# Patient Record
Sex: Male | Born: 1935
Health system: Southern US, Community
[De-identification: ages and names within clinical notes are randomized; demographics above are authoritative.]

## PROBLEM LIST (undated history)

## (undated) DIAGNOSIS — G4733 Obstructive sleep apnea (adult) (pediatric): Secondary | ICD-10-CM

## (undated) DIAGNOSIS — I639 Cerebral infarction, unspecified: Secondary | ICD-10-CM

## (undated) DIAGNOSIS — H9190 Unspecified hearing loss, unspecified ear: Secondary | ICD-10-CM

## (undated) DIAGNOSIS — I38 Endocarditis, valve unspecified: Secondary | ICD-10-CM

## (undated) DIAGNOSIS — E119 Type 2 diabetes mellitus without complications: Secondary | ICD-10-CM

## (undated) DIAGNOSIS — N200 Calculus of kidney: Secondary | ICD-10-CM

## (undated) DIAGNOSIS — I4891 Unspecified atrial fibrillation: Secondary | ICD-10-CM

## (undated) DIAGNOSIS — N189 Chronic kidney disease, unspecified: Secondary | ICD-10-CM

## (undated) DIAGNOSIS — I10 Essential (primary) hypertension: Secondary | ICD-10-CM

## (undated) DIAGNOSIS — I509 Heart failure, unspecified: Secondary | ICD-10-CM

## (undated) DIAGNOSIS — I82412 Acute embolism and thrombosis of left femoral vein: Secondary | ICD-10-CM

## (undated) DIAGNOSIS — E785 Hyperlipidemia, unspecified: Secondary | ICD-10-CM

## (undated) DIAGNOSIS — C61 Malignant neoplasm of prostate: Secondary | ICD-10-CM

## (undated) DIAGNOSIS — K7689 Other specified diseases of liver: Secondary | ICD-10-CM

## (undated) DIAGNOSIS — I495 Sick sinus syndrome: Secondary | ICD-10-CM

## (undated) DIAGNOSIS — Z87448 Personal history of other diseases of urinary system: Secondary | ICD-10-CM

## (undated) HISTORY — DX: Cerebral infarction, unspecified: I63.9

## (undated) HISTORY — DX: Unspecified hearing loss, unspecified ear: H91.90

## (undated) HISTORY — DX: Chronic kidney disease, unspecified: N18.9

## (undated) HISTORY — PX: PROSTATE SURGERY: SHX751

## (undated) HISTORY — DX: Personal history of other diseases of urinary system: Z87.448

## (undated) HISTORY — PX: OTHER SURGICAL HISTORY: SHX169

## (undated) HISTORY — DX: Endocarditis, valve unspecified: I38

## (undated) HISTORY — DX: Acute embolism and thrombosis of left femoral vein: I82.412

## (undated) HISTORY — DX: Obstructive sleep apnea (adult) (pediatric): G47.33

## (undated) HISTORY — PX: CATARACT EXTRACTION: SUR2

## (undated) HISTORY — DX: Hyperlipidemia, unspecified: E78.5

## (undated) HISTORY — DX: Essential (primary) hypertension: I10

## (undated) HISTORY — DX: Other specified diseases of liver: K76.89

## (undated) HISTORY — DX: Type 2 diabetes mellitus without complications: E11.9

---

## 2003-11-16 ENCOUNTER — Ambulatory Visit: Payer: Self-pay | Admitting: Gastroenterology

## 2004-12-03 ENCOUNTER — Emergency Department: Payer: Self-pay | Admitting: Emergency Medicine

## 2004-12-03 ENCOUNTER — Other Ambulatory Visit: Payer: Self-pay

## 2004-12-12 ENCOUNTER — Ambulatory Visit: Payer: Self-pay | Admitting: Family Medicine

## 2005-07-30 ENCOUNTER — Other Ambulatory Visit: Payer: Self-pay

## 2005-07-30 ENCOUNTER — Inpatient Hospital Stay: Payer: Self-pay | Admitting: Surgery

## 2005-09-09 ENCOUNTER — Ambulatory Visit: Payer: Self-pay | Admitting: Gastroenterology

## 2006-04-30 ENCOUNTER — Ambulatory Visit: Payer: Self-pay | Admitting: Specialist

## 2006-05-29 ENCOUNTER — Ambulatory Visit: Payer: Self-pay | Admitting: Family Medicine

## 2006-10-07 ENCOUNTER — Emergency Department: Payer: Self-pay | Admitting: Emergency Medicine

## 2006-12-09 ENCOUNTER — Ambulatory Visit: Payer: Self-pay | Admitting: Specialist

## 2007-04-17 ENCOUNTER — Ambulatory Visit: Payer: Self-pay | Admitting: Internal Medicine

## 2007-05-07 ENCOUNTER — Ambulatory Visit: Payer: Self-pay | Admitting: Internal Medicine

## 2008-10-07 ENCOUNTER — Emergency Department: Payer: Self-pay | Admitting: Emergency Medicine

## 2008-11-03 ENCOUNTER — Ambulatory Visit: Payer: Self-pay | Admitting: Gastroenterology

## 2009-04-25 ENCOUNTER — Emergency Department: Payer: Self-pay | Admitting: Emergency Medicine

## 2009-07-07 ENCOUNTER — Emergency Department: Payer: Self-pay | Admitting: Emergency Medicine

## 2010-03-17 ENCOUNTER — Inpatient Hospital Stay: Payer: Self-pay | Admitting: Surgery

## 2010-04-13 ENCOUNTER — Emergency Department: Payer: Self-pay | Admitting: Emergency Medicine

## 2010-04-25 ENCOUNTER — Ambulatory Visit: Payer: Self-pay | Admitting: Surgery

## 2010-07-23 ENCOUNTER — Inpatient Hospital Stay: Payer: Self-pay | Admitting: Internal Medicine

## 2010-08-01 ENCOUNTER — Ambulatory Visit: Payer: Self-pay

## 2011-04-17 ENCOUNTER — Observation Stay: Payer: Self-pay | Admitting: Internal Medicine

## 2011-04-17 LAB — URINALYSIS, COMPLETE
Bacteria: NONE SEEN
Bilirubin,UR: NEGATIVE
Glucose,UR: 50 mg/dL (ref 0–75)
Hyaline Cast: 4
Ketone: NEGATIVE
Leukocyte Esterase: NEGATIVE
Nitrite: NEGATIVE
Ph: 6 (ref 4.5–8.0)
Protein: NEGATIVE
RBC,UR: 1 /HPF (ref 0–5)
Specific Gravity: 1.017 (ref 1.003–1.030)
Squamous Epithelial: <1
WBC UR: 1 /HPF (ref 0–5)

## 2011-04-17 LAB — COMPREHENSIVE METABOLIC PANEL
Albumin: 3.3 g/dL — ABNORMAL LOW (ref 3.4–5.0)
Alkaline Phosphatase: 135 U/L (ref 50–136)
Anion Gap: 10 (ref 7–16)
BUN: 22 mg/dL — ABNORMAL HIGH (ref 7–18)
Bilirubin,Total: 0.3 mg/dL (ref 0.2–1.0)
Calcium, Total: 8.8 mg/dL (ref 8.5–10.1)
Chloride: 109 mmol/L — ABNORMAL HIGH (ref 98–107)
Co2: 23 mmol/L (ref 21–32)
Creatinine: 1.72 mg/dL — ABNORMAL HIGH (ref 0.60–1.30)
EGFR (African American): 50 — ABNORMAL LOW
EGFR (Non-African Amer.): 41 — ABNORMAL LOW
Glucose: 167 mg/dL — ABNORMAL HIGH (ref 65–99)
Osmolality: 290 (ref 275–301)
Potassium: 4 mmol/L (ref 3.5–5.1)
SGOT(AST): 26 U/L (ref 15–37)
SGPT (ALT): 34 U/L
Sodium: 142 mmol/L (ref 136–145)
Total Protein: 7.5 g/dL (ref 6.4–8.2)

## 2011-04-17 LAB — PROTIME-INR
INR: 0.9
Prothrombin Time: 12.3 secs (ref 11.5–14.7)

## 2011-04-17 LAB — CBC WITH DIFFERENTIAL/PLATELET
Basophil #: 0 10*3/uL (ref 0.0–0.1)
Basophil %: 0.4 %
Eosinophil #: 0.3 10*3/uL (ref 0.0–0.7)
Eosinophil %: 3.4 %
HCT: 44.2 % (ref 40.0–52.0)
HGB: 14.2 g/dL (ref 13.0–18.0)
Lymphocyte #: 2.5 10*3/uL (ref 1.0–3.6)
Lymphocyte %: 29.9 %
MCH: 29 pg (ref 26.0–34.0)
MCHC: 32.1 g/dL (ref 32.0–36.0)
MCV: 90 fL (ref 80–100)
Monocyte #: 0.9 10*3/uL — ABNORMAL HIGH (ref 0.0–0.7)
Monocyte %: 10.3 %
Neutrophil #: 4.6 10*3/uL (ref 1.4–6.5)
Neutrophil %: 56 %
Platelet: 168 10*3/uL (ref 150–440)
RBC: 4.9 10*6/uL (ref 4.40–5.90)
RDW: 16 % — ABNORMAL HIGH (ref 11.5–14.5)
WBC: 8.3 10*3/uL (ref 3.8–10.6)

## 2011-04-17 LAB — TROPONIN I
Troponin-I: <0.02 ng/mL
Troponin-I: <0.02 ng/mL
Troponin-I: <0.02 ng/mL

## 2011-04-17 LAB — CK TOTAL AND CKMB (NOT AT ARMC)
CK, Total: 168 U/L (ref 35–232)
CK, Total: 188 U/L (ref 35–232)
CK, Total: 194 U/L (ref 35–232)
CK-MB: 2.9 ng/mL (ref 0.5–3.6)
CK-MB: 2.7 ng/mL (ref 0.5–3.6)
CK-MB: 2.4 ng/mL (ref 0.5–3.6)

## 2011-04-18 LAB — BASIC METABOLIC PANEL
Anion Gap: 11 (ref 7–16)
BUN: 17 mg/dL (ref 7–18)
Calcium, Total: 8.9 mg/dL (ref 8.5–10.1)
Chloride: 107 mmol/L (ref 98–107)
Co2: 24 mmol/L (ref 21–32)
Creatinine: 1.34 mg/dL — ABNORMAL HIGH (ref 0.60–1.30)
EGFR (African American): >60
EGFR (Non-African Amer.): 55 — ABNORMAL LOW
Glucose: 108 mg/dL — ABNORMAL HIGH (ref 65–99)
Osmolality: 285 (ref 275–301)
Potassium: 3.9 mmol/L (ref 3.5–5.1)
Sodium: 142 mmol/L (ref 136–145)

## 2011-04-18 LAB — CBC WITH DIFFERENTIAL/PLATELET
Basophil #: 0 10*3/uL (ref 0.0–0.1)
Basophil %: 0.1 %
Eosinophil #: 0.2 10*3/uL (ref 0.0–0.7)
Eosinophil %: 3.5 %
HCT: 43.7 % (ref 40.0–52.0)
HGB: 14.2 g/dL (ref 13.0–18.0)
Lymphocyte #: 2.9 10*3/uL (ref 1.0–3.6)
Lymphocyte %: 44.8 %
MCH: 29.2 pg (ref 26.0–34.0)
MCHC: 32.4 g/dL (ref 32.0–36.0)
MCV: 90 fL (ref 80–100)
Monocyte #: 0.7 10*3/uL (ref 0.0–0.7)
Monocyte %: 10.5 %
Neutrophil #: 2.7 10*3/uL (ref 1.4–6.5)
Neutrophil %: 41.1 %
Platelet: 178 10*3/uL (ref 150–440)
RBC: 4.84 10*6/uL (ref 4.40–5.90)
RDW: 15.6 % — ABNORMAL HIGH (ref 11.5–14.5)
WBC: 6.5 10*3/uL (ref 3.8–10.6)

## 2011-04-18 LAB — TSH: Thyroid Stimulating Horm: 0.76 u[IU]/mL

## 2011-05-20 ENCOUNTER — Emergency Department: Payer: Self-pay | Admitting: Emergency Medicine

## 2011-05-20 LAB — CK TOTAL AND CKMB (NOT AT ARMC)
CK, Total: 116 U/L (ref 35–232)
CK-MB: 1.8 ng/mL (ref 0.5–3.6)

## 2011-05-20 LAB — COMPREHENSIVE METABOLIC PANEL
Albumin: 3.2 g/dL — ABNORMAL LOW (ref 3.4–5.0)
Alkaline Phosphatase: 129 U/L (ref 50–136)
Anion Gap: 5 — ABNORMAL LOW (ref 7–16)
BUN: 27 mg/dL — ABNORMAL HIGH (ref 7–18)
Bilirubin,Total: 0.2 mg/dL (ref 0.2–1.0)
Calcium, Total: 8.7 mg/dL (ref 8.5–10.1)
Chloride: 104 mmol/L (ref 98–107)
Co2: 28 mmol/L (ref 21–32)
Creatinine: 2.05 mg/dL — ABNORMAL HIGH (ref 0.60–1.30)
EGFR (African American): 35 — ABNORMAL LOW
EGFR (Non-African Amer.): 31 — ABNORMAL LOW
Glucose: 151 mg/dL — ABNORMAL HIGH (ref 65–99)
Osmolality: 282 (ref 275–301)
Potassium: 4.3 mmol/L (ref 3.5–5.1)
SGOT(AST): 23 U/L (ref 15–37)
SGPT (ALT): 28 U/L
Sodium: 137 mmol/L (ref 136–145)
Total Protein: 7.3 g/dL (ref 6.4–8.2)

## 2011-05-20 LAB — CBC
HCT: 42.2 % (ref 40.0–52.0)
HGB: 13.7 g/dL (ref 13.0–18.0)
MCH: 30 pg (ref 26.0–34.0)
MCHC: 32.5 g/dL (ref 32.0–36.0)
MCV: 92 fL (ref 80–100)
Platelet: 157 10*3/uL (ref 150–440)
RBC: 4.57 10*6/uL (ref 4.40–5.90)
RDW: 16.2 % — ABNORMAL HIGH (ref 11.5–14.5)
WBC: 7.4 10*3/uL (ref 3.8–10.6)

## 2011-05-20 LAB — TROPONIN I: Troponin-I: <0.02 ng/mL

## 2011-05-21 LAB — LIPASE, BLOOD: Lipase: 126 U/L (ref 73–393)

## 2011-05-27 ENCOUNTER — Ambulatory Visit: Payer: Self-pay | Admitting: Internal Medicine

## 2011-06-24 ENCOUNTER — Ambulatory Visit: Payer: Self-pay | Admitting: Internal Medicine

## 2012-07-16 ENCOUNTER — Emergency Department: Payer: Self-pay | Admitting: Emergency Medicine

## 2012-11-06 ENCOUNTER — Ambulatory Visit: Payer: Self-pay | Admitting: Podiatry

## 2012-12-03 ENCOUNTER — Encounter: Payer: Self-pay | Admitting: Podiatry

## 2012-12-04 ENCOUNTER — Encounter: Payer: Self-pay | Admitting: Podiatry

## 2012-12-04 ENCOUNTER — Ambulatory Visit (INDEPENDENT_AMBULATORY_CARE_PROVIDER_SITE_OTHER): Payer: Medicare Other | Admitting: Podiatry

## 2012-12-04 VITALS — BP 97/52 | HR 75 | Resp 16 | Ht 72.0 in | Wt 252.0 lb

## 2012-12-04 DIAGNOSIS — B351 Tinea unguium: Secondary | ICD-10-CM

## 2012-12-04 DIAGNOSIS — M79609 Pain in unspecified limb: Secondary | ICD-10-CM

## 2012-12-04 NOTE — Progress Notes (Signed)
Subjective:     Patient ID: [name], male   DOB: 04-Nov-1933, 79 y.o.   MRN: [id number]  HPI patient states I have very thick painful nails of both feet   Review of Systems     Objective:   Physical Exam Neurovascular status intact with painful thick nailbeds 1-5 both feet noted    Assessment:     Mycotic nail infection with pain 1-5 both feet    Plan:     Debridement nailbeds 1-5 both feet with no iatrogenic bleeding noted

## 2012-12-04 NOTE — Progress Notes (Signed)
  Subjective:    Patient ID: [name], male    DOB: 04/13/1933, 79 y.o.   MRN: [id number]  HPI    Review of Systems  Constitutional: Negative.   HENT: Negative.   Eyes: Negative.   Respiratory: Negative.   Cardiovascular: Negative.   Gastrointestinal: Negative.   Endocrine: Negative.   Genitourinary: Negative.   Musculoskeletal: Negative.   Skin: Negative.   Allergic/Immunologic: Negative.   Neurological: Negative.   Hematological: Negative.   Psychiatric/Behavioral: Negative.        Objective:   Physical Exam        Assessment & Plan:

## 2013-03-05 ENCOUNTER — Ambulatory Visit: Payer: Medicare Other | Admitting: Podiatry

## 2013-05-27 ENCOUNTER — Inpatient Hospital Stay: Payer: Self-pay | Admitting: Specialist

## 2013-05-27 LAB — BASIC METABOLIC PANEL
Anion Gap: 6 — ABNORMAL LOW (ref 7–16)
BUN: 32 mg/dL — ABNORMAL HIGH (ref 7–18)
Calcium, Total: 9 mg/dL (ref 8.5–10.1)
Chloride: 106 mmol/L (ref 98–107)
Co2: 26 mmol/L (ref 21–32)
Creatinine: 2.46 mg/dL — ABNORMAL HIGH (ref 0.60–1.30)
EGFR (African American): 28 — ABNORMAL LOW
EGFR (Non-African Amer.): 24 — ABNORMAL LOW
Glucose: 183 mg/dL — ABNORMAL HIGH (ref 65–99)
Osmolality: 287 (ref 275–301)
Potassium: 4.6 mmol/L (ref 3.5–5.1)
Sodium: 138 mmol/L (ref 136–145)

## 2013-05-27 LAB — TROPONIN I: Troponin-I: <0.02 ng/mL

## 2013-05-27 LAB — CBC
HCT: 47.3 % (ref 40.0–52.0)
HGB: 15.2 g/dL (ref 13.0–18.0)
MCH: 30.3 pg (ref 26.0–34.0)
MCHC: 32.2 g/dL (ref 32.0–36.0)
MCV: 94 fL (ref 80–100)
Platelet: 181 10*3/uL (ref 150–440)
RBC: 5.02 10*6/uL (ref 4.40–5.90)
RDW: 15.3 % — ABNORMAL HIGH (ref 11.5–14.5)
WBC: 6.1 10*3/uL (ref 3.8–10.6)

## 2013-05-28 LAB — CBC WITH DIFFERENTIAL/PLATELET
Basophil #: 0 10*3/uL (ref 0.0–0.1)
Basophil %: 0.5 %
Eosinophil #: 0.2 10*3/uL (ref 0.0–0.7)
Eosinophil %: 3.3 %
HCT: 44 % (ref 40.0–52.0)
HGB: 13.8 g/dL (ref 13.0–18.0)
Lymphocyte #: 2.1 10*3/uL (ref 1.0–3.6)
Lymphocyte %: 36.2 %
MCH: 29.5 pg (ref 26.0–34.0)
MCHC: 31.3 g/dL — ABNORMAL LOW (ref 32.0–36.0)
MCV: 94 fL (ref 80–100)
Monocyte #: 1.1 x10 3/mm — ABNORMAL HIGH (ref 0.2–1.0)
Monocyte %: 19.3 %
Neutrophil #: 2.3 10*3/uL (ref 1.4–6.5)
Neutrophil %: 40.7 %
Platelet: 167 10*3/uL (ref 150–440)
RBC: 4.67 10*6/uL (ref 4.40–5.90)
RDW: 15 % — ABNORMAL HIGH (ref 11.5–14.5)
WBC: 5.8 10*3/uL (ref 3.8–10.6)

## 2013-05-28 LAB — BASIC METABOLIC PANEL
Anion Gap: 6 — ABNORMAL LOW (ref 7–16)
BUN: 29 mg/dL — ABNORMAL HIGH (ref 7–18)
Calcium, Total: 8.5 mg/dL (ref 8.5–10.1)
Chloride: 105 mmol/L (ref 98–107)
Co2: 26 mmol/L (ref 21–32)
Creatinine: 1.85 mg/dL — ABNORMAL HIGH (ref 0.60–1.30)
EGFR (African American): 40 — ABNORMAL LOW
EGFR (Non-African Amer.): 34 — ABNORMAL LOW
Glucose: 88 mg/dL (ref 65–99)
Osmolality: 279 (ref 275–301)
Potassium: 3.8 mmol/L (ref 3.5–5.1)
Sodium: 137 mmol/L (ref 136–145)

## 2013-05-28 LAB — TSH: Thyroid Stimulating Horm: 1.32 u[IU]/mL

## 2013-05-29 LAB — BASIC METABOLIC PANEL
Anion Gap: 4 — ABNORMAL LOW (ref 7–16)
BUN: 22 mg/dL — ABNORMAL HIGH (ref 7–18)
Calcium, Total: 8.7 mg/dL (ref 8.5–10.1)
Chloride: 106 mmol/L (ref 98–107)
Co2: 28 mmol/L (ref 21–32)
Creatinine: 1.64 mg/dL — ABNORMAL HIGH (ref 0.60–1.30)
EGFR (African American): 46 — ABNORMAL LOW
EGFR (Non-African Amer.): 39 — ABNORMAL LOW
Glucose: 114 mg/dL — ABNORMAL HIGH (ref 65–99)
Osmolality: 280 (ref 275–301)
Potassium: 4.3 mmol/L (ref 3.5–5.1)
Sodium: 138 mmol/L (ref 136–145)

## 2013-05-30 ENCOUNTER — Emergency Department: Payer: Self-pay | Admitting: Emergency Medicine

## 2013-05-30 LAB — COMPREHENSIVE METABOLIC PANEL
Albumin: 3.2 g/dL — ABNORMAL LOW (ref 3.4–5.0)
Alkaline Phosphatase: 129 U/L — ABNORMAL HIGH
Anion Gap: 6 — ABNORMAL LOW (ref 7–16)
BUN: 22 mg/dL — ABNORMAL HIGH (ref 7–18)
Bilirubin,Total: 0.8 mg/dL (ref 0.2–1.0)
Calcium, Total: 9 mg/dL (ref 8.5–10.1)
Chloride: 101 mmol/L (ref 98–107)
Co2: 28 mmol/L (ref 21–32)
Creatinine: 1.91 mg/dL — ABNORMAL HIGH (ref 0.60–1.30)
EGFR (African American): 38 — ABNORMAL LOW
EGFR (Non-African Amer.): 33 — ABNORMAL LOW
Glucose: 147 mg/dL — ABNORMAL HIGH (ref 65–99)
Osmolality: 276 (ref 275–301)
Potassium: 4.8 mmol/L (ref 3.5–5.1)
SGOT(AST): 24 U/L (ref 15–37)
SGPT (ALT): 25 U/L (ref 12–78)
Sodium: 135 mmol/L — ABNORMAL LOW (ref 136–145)
Total Protein: 8.1 g/dL (ref 6.4–8.2)

## 2013-05-30 LAB — URINALYSIS, COMPLETE
Bacteria: NONE SEEN
Bilirubin,UR: NEGATIVE
Glucose,UR: 50 mg/dL (ref 0–75)
Ketone: NEGATIVE
Leukocyte Esterase: NEGATIVE
Nitrite: NEGATIVE
Ph: 5 (ref 4.5–8.0)
Protein: NEGATIVE
RBC,UR: 1 /HPF (ref 0–5)
Specific Gravity: 1.019 (ref 1.003–1.030)
Squamous Epithelial: 1
WBC UR: 2 /HPF (ref 0–5)

## 2013-05-30 LAB — CBC WITH DIFFERENTIAL/PLATELET
Basophil #: 0 10*3/uL (ref 0.0–0.1)
Basophil %: 0.3 %
Eosinophil #: 0 10*3/uL (ref 0.0–0.7)
Eosinophil %: 0.4 %
HCT: 46.6 % (ref 40.0–52.0)
HGB: 15.3 g/dL (ref 13.0–18.0)
Lymphocyte #: 0.5 10*3/uL — ABNORMAL LOW (ref 1.0–3.6)
Lymphocyte %: 6.7 %
MCH: 30.5 pg (ref 26.0–34.0)
MCHC: 32.7 g/dL (ref 32.0–36.0)
MCV: 93 fL (ref 80–100)
Monocyte #: 0.1 x10 3/mm — ABNORMAL LOW (ref 0.2–1.0)
Monocyte %: 1.8 %
Neutrophil #: 7 10*3/uL — ABNORMAL HIGH (ref 1.4–6.5)
Neutrophil %: 90.8 %
Platelet: 166 10*3/uL (ref 150–440)
RBC: 5 10*6/uL (ref 4.40–5.90)
RDW: 14.9 % — ABNORMAL HIGH (ref 11.5–14.5)
WBC: 7.7 10*3/uL (ref 3.8–10.6)

## 2013-05-30 LAB — TROPONIN I
Troponin-I: <0.02 ng/mL
Troponin-I: 0.03 ng/mL

## 2013-05-30 LAB — LIPASE, BLOOD: Lipase: 169 U/L (ref 73–393)

## 2013-05-30 LAB — BILIRUBIN, DIRECT: Bilirubin, Direct: 0.1 mg/dL (ref 0.00–0.20)

## 2013-06-08 DIAGNOSIS — N183 Chronic kidney disease, stage 3 (moderate): Secondary | ICD-10-CM

## 2013-06-08 DIAGNOSIS — E119 Type 2 diabetes mellitus without complications: Secondary | ICD-10-CM | POA: Insufficient documentation

## 2013-06-08 DIAGNOSIS — I38 Endocarditis, valve unspecified: Secondary | ICD-10-CM | POA: Insufficient documentation

## 2013-06-08 DIAGNOSIS — N1832 Chronic kidney disease, stage 3b: Secondary | ICD-10-CM | POA: Insufficient documentation

## 2013-06-08 DIAGNOSIS — N184 Chronic kidney disease, stage 4 (severe): Secondary | ICD-10-CM | POA: Insufficient documentation

## 2013-06-08 DIAGNOSIS — N189 Chronic kidney disease, unspecified: Secondary | ICD-10-CM | POA: Insufficient documentation

## 2013-06-08 DIAGNOSIS — E1122 Type 2 diabetes mellitus with diabetic chronic kidney disease: Secondary | ICD-10-CM | POA: Insufficient documentation

## 2013-06-08 DIAGNOSIS — E1169 Type 2 diabetes mellitus with other specified complication: Secondary | ICD-10-CM | POA: Insufficient documentation

## 2013-06-08 DIAGNOSIS — E782 Mixed hyperlipidemia: Secondary | ICD-10-CM | POA: Insufficient documentation

## 2013-06-11 LAB — TROPONIN I: Troponin-I: <0.02 ng/mL

## 2013-06-11 LAB — COMPREHENSIVE METABOLIC PANEL
Albumin: 2.9 g/dL — ABNORMAL LOW (ref 3.4–5.0)
Alkaline Phosphatase: 133 U/L — ABNORMAL HIGH
Anion Gap: 4 — ABNORMAL LOW (ref 7–16)
BUN: 26 mg/dL — ABNORMAL HIGH (ref 7–18)
Bilirubin,Total: 0.2 mg/dL (ref 0.2–1.0)
Calcium, Total: 8.6 mg/dL (ref 8.5–10.1)
Chloride: 108 mmol/L — ABNORMAL HIGH (ref 98–107)
Co2: 26 mmol/L (ref 21–32)
Creatinine: 2.01 mg/dL — ABNORMAL HIGH (ref 0.60–1.30)
EGFR (African American): 36 — ABNORMAL LOW
EGFR (Non-African Amer.): 31 — ABNORMAL LOW
Glucose: 164 mg/dL — ABNORMAL HIGH (ref 65–99)
Osmolality: 284 (ref 275–301)
Potassium: 4.7 mmol/L (ref 3.5–5.1)
SGOT(AST): 40 U/L — ABNORMAL HIGH (ref 15–37)
SGPT (ALT): 26 U/L (ref 12–78)
Sodium: 138 mmol/L (ref 136–145)
Total Protein: 7.2 g/dL (ref 6.4–8.2)

## 2013-06-11 LAB — URINALYSIS, COMPLETE
Bacteria: NONE SEEN
Bilirubin,UR: NEGATIVE
Glucose,UR: 50 mg/dL (ref 0–75)
Ketone: NEGATIVE
Leukocyte Esterase: NEGATIVE
Nitrite: NEGATIVE
Ph: 5 (ref 4.5–8.0)
Protein: NEGATIVE
RBC,UR: 7 /HPF (ref 0–5)
Specific Gravity: 1.017 (ref 1.003–1.030)
Squamous Epithelial: <1
WBC UR: 2 /HPF (ref 0–5)

## 2013-06-11 LAB — DRUG SCREEN, URINE

## 2013-06-11 LAB — CBC WITH DIFFERENTIAL/PLATELET
Basophil #: 0.1 10*3/uL (ref 0.0–0.1)
Basophil %: 0.9 %
Eosinophil #: 0.3 10*3/uL (ref 0.0–0.7)
Eosinophil %: 3.1 %
HCT: 42 % (ref 40.0–52.0)
HGB: 13.8 g/dL (ref 13.0–18.0)
Lymphocyte #: 2.7 10*3/uL (ref 1.0–3.6)
Lymphocyte %: 32 %
MCH: 30.6 pg (ref 26.0–34.0)
MCHC: 32.9 g/dL (ref 32.0–36.0)
MCV: 93 fL (ref 80–100)
Monocyte #: 0.8 x10 3/mm (ref 0.2–1.0)
Monocyte %: 9.2 %
Neutrophil #: 4.5 10*3/uL (ref 1.4–6.5)
Neutrophil %: 54.8 %
Platelet: 210 10*3/uL (ref 150–440)
RBC: 4.51 10*6/uL (ref 4.40–5.90)
RDW: 14.8 % — ABNORMAL HIGH (ref 11.5–14.5)
WBC: 8.3 10*3/uL (ref 3.8–10.6)

## 2013-06-11 LAB — TSH: Thyroid Stimulating Horm: 1.59 u[IU]/mL

## 2013-06-11 LAB — PROTIME-INR
INR: 1.2
Prothrombin Time: 14.9 secs — ABNORMAL HIGH (ref 11.5–14.7)

## 2013-06-11 LAB — PRO B NATRIURETIC PEPTIDE: B-Type Natriuretic Peptide: 339 pg/mL (ref 0–450)

## 2013-06-12 ENCOUNTER — Observation Stay: Payer: Self-pay | Admitting: Family Medicine

## 2013-06-12 LAB — TROPONIN I
Troponin-I: <0.02 ng/mL
Troponin-I: <0.02 ng/mL

## 2013-06-12 LAB — CK TOTAL AND CKMB (NOT AT ARMC)
CK, Total: 85 U/L
CK, Total: 99 U/L
CK-MB: 2.4 ng/mL (ref 0.5–3.6)
CK-MB: 2.4 ng/mL (ref 0.5–3.6)

## 2013-06-12 LAB — D-DIMER(ARMC): D-Dimer: 666 ng/ml

## 2013-06-13 LAB — LIPID PANEL
Cholesterol: 125 mg/dL (ref 0–200)
HDL Cholesterol: 39 mg/dL — ABNORMAL LOW (ref 40–60)
Ldl Cholesterol, Calc: 61 mg/dL (ref 0–100)
Triglycerides: 123 mg/dL (ref 0–200)
VLDL Cholesterol, Calc: 25 mg/dL (ref 5–40)

## 2013-06-28 DIAGNOSIS — K7689 Other specified diseases of liver: Secondary | ICD-10-CM | POA: Insufficient documentation

## 2013-06-28 DIAGNOSIS — Z86010 Personal history of colonic polyps: Secondary | ICD-10-CM | POA: Insufficient documentation

## 2013-09-26 LAB — BASIC METABOLIC PANEL
Anion Gap: 8 (ref 7–16)
BUN: 21 mg/dL — ABNORMAL HIGH (ref 7–18)
Calcium, Total: 8.2 mg/dL — ABNORMAL LOW (ref 8.5–10.1)
Chloride: 106 mmol/L (ref 98–107)
Co2: 26 mmol/L (ref 21–32)
Creatinine: 1.92 mg/dL — ABNORMAL HIGH (ref 0.60–1.30)
EGFR (African American): 38 — ABNORMAL LOW
EGFR (Non-African Amer.): 33 — ABNORMAL LOW
Glucose: 188 mg/dL — ABNORMAL HIGH (ref 65–99)
Osmolality: 287 (ref 275–301)
Potassium: 4.6 mmol/L (ref 3.5–5.1)
Sodium: 140 mmol/L (ref 136–145)

## 2013-09-26 LAB — CBC
HCT: 45 % (ref 40.0–52.0)
HGB: 14.2 g/dL (ref 13.0–18.0)
MCH: 29.4 pg (ref 26.0–34.0)
MCHC: 31.6 g/dL — ABNORMAL LOW (ref 32.0–36.0)
MCV: 93 fL (ref 80–100)
Platelet: 170 10*3/uL (ref 150–440)
RBC: 4.84 10*6/uL (ref 4.40–5.90)
RDW: 15.3 % — ABNORMAL HIGH (ref 11.5–14.5)
WBC: 6.9 10*3/uL (ref 3.8–10.6)

## 2013-09-26 LAB — PRO B NATRIURETIC PEPTIDE: B-Type Natriuretic Peptide: 269 pg/mL (ref 0–450)

## 2013-09-26 LAB — TROPONIN I: Troponin-I: <0.02 ng/mL

## 2013-09-27 ENCOUNTER — Inpatient Hospital Stay: Payer: Self-pay | Admitting: Internal Medicine

## 2013-09-27 LAB — D-DIMER(ARMC): D-Dimer: 1063 ng/ml

## 2013-09-27 LAB — TROPONIN I
Troponin-I: 0.03 ng/mL
Troponin-I: 0.03 ng/mL

## 2013-09-27 LAB — CK-MB
CK-MB: 0.9 ng/mL (ref 0.5–3.6)
CK-MB: 1 ng/mL (ref 0.5–3.6)

## 2013-10-12 ENCOUNTER — Observation Stay: Payer: Self-pay | Admitting: Internal Medicine

## 2013-10-12 LAB — COMPREHENSIVE METABOLIC PANEL WITH GFR
Albumin: 3 g/dL — ABNORMAL LOW
Alkaline Phosphatase: 128 U/L — ABNORMAL HIGH
Anion Gap: 6 — ABNORMAL LOW
BUN: 24 mg/dL — ABNORMAL HIGH
Bilirubin,Total: 0.3 mg/dL
Calcium, Total: 8.6 mg/dL
Chloride: 107 mmol/L
Co2: 26 mmol/L
Creatinine: 2.03 mg/dL — ABNORMAL HIGH
EGFR (African American): 35 — ABNORMAL LOW
EGFR (Non-African Amer.): 30 — ABNORMAL LOW
Glucose: 164 mg/dL — ABNORMAL HIGH
Osmolality: 285
Potassium: 4.3 mmol/L
SGOT(AST): 22 U/L
SGPT (ALT): 29 U/L
Sodium: 139 mmol/L
Total Protein: 7.4 g/dL

## 2013-10-12 LAB — URINALYSIS, COMPLETE
Bacteria: NONE SEEN
Bilirubin,UR: NEGATIVE
Glucose,UR: 50 mg/dL
Ketone: NEGATIVE
Leukocyte Esterase: NEGATIVE
Nitrite: NEGATIVE
Ph: 5
Protein: NEGATIVE
RBC,UR: 3 /HPF
Specific Gravity: 1.015
Squamous Epithelial: NONE SEEN
WBC UR: 1 /HPF

## 2013-10-12 LAB — PROTIME-INR
INR: 1.6
Prothrombin Time: 19 secs — ABNORMAL HIGH (ref 11.5–14.7)

## 2013-10-12 LAB — CBC
HCT: 47.4 %
HGB: 15.1 g/dL
MCH: 29.2 pg
MCHC: 31.9 g/dL — ABNORMAL LOW
MCV: 92 fL
Platelet: 194 x10 3/mm 3
RBC: 5.17 x10 6/mm 3
RDW: 15.3 % — ABNORMAL HIGH
WBC: 8.8 x10 3/mm 3

## 2013-10-12 LAB — LIPID PANEL
Cholesterol: 140 mg/dL (ref 0–200)
HDL Cholesterol: 43 mg/dL (ref 40–60)
Ldl Cholesterol, Calc: 69 mg/dL (ref 0–100)
Triglycerides: 139 mg/dL (ref 0–200)
VLDL Cholesterol, Calc: 28 mg/dL (ref 5–40)

## 2013-10-12 LAB — CK TOTAL AND CKMB (NOT AT ARMC)
CK, Total: 73 U/L
CK, Total: 70 U/L
CK-MB: 1.9 ng/mL
CK-MB: 2.1 ng/mL (ref 0.5–3.6)

## 2013-10-12 LAB — TROPONIN I
Troponin-I: <0.02 ng/mL
Troponin-I: <0.02 ng/mL
Troponin-I: <0.02 ng/mL

## 2013-10-12 LAB — APTT: Activated PTT: 51.3 secs — ABNORMAL HIGH (ref 23.6–35.9)

## 2013-10-13 LAB — BASIC METABOLIC PANEL
Anion Gap: 5 — ABNORMAL LOW (ref 7–16)
BUN: 22 mg/dL — ABNORMAL HIGH (ref 7–18)
Calcium, Total: 8.2 mg/dL — ABNORMAL LOW (ref 8.5–10.1)
Chloride: 106 mmol/L (ref 98–107)
Co2: 27 mmol/L (ref 21–32)
Creatinine: 1.65 mg/dL — ABNORMAL HIGH (ref 0.60–1.30)
EGFR (African American): 45 — ABNORMAL LOW
EGFR (Non-African Amer.): 39 — ABNORMAL LOW
Glucose: 97 mg/dL (ref 65–99)
Osmolality: 279 (ref 275–301)
Potassium: 4 mmol/L (ref 3.5–5.1)
Sodium: 138 mmol/L (ref 136–145)

## 2013-10-13 LAB — CBC WITH DIFFERENTIAL/PLATELET
Basophil #: 0 10*3/uL (ref 0.0–0.1)
Basophil %: 0.6 %
Eosinophil #: 0.2 10*3/uL (ref 0.0–0.7)
Eosinophil %: 3.5 %
HCT: 42.2 % (ref 40.0–52.0)
HGB: 13.4 g/dL (ref 13.0–18.0)
Lymphocyte #: 2.4 10*3/uL (ref 1.0–3.6)
Lymphocyte %: 40.2 %
MCH: 29.3 pg (ref 26.0–34.0)
MCHC: 31.8 g/dL — ABNORMAL LOW (ref 32.0–36.0)
MCV: 92 fL (ref 80–100)
Monocyte #: 0.8 x10 3/mm (ref 0.2–1.0)
Monocyte %: 14.1 %
Neutrophil #: 2.4 10*3/uL (ref 1.4–6.5)
Neutrophil %: 41.6 %
Platelet: 159 10*3/uL (ref 150–440)
RBC: 4.59 10*6/uL (ref 4.40–5.90)
RDW: 15.3 % — ABNORMAL HIGH (ref 11.5–14.5)
WBC: 5.9 10*3/uL (ref 3.8–10.6)

## 2013-10-15 ENCOUNTER — Ambulatory Visit (INDEPENDENT_AMBULATORY_CARE_PROVIDER_SITE_OTHER): Payer: Medicare Other | Admitting: Podiatry

## 2013-10-15 DIAGNOSIS — M79676 Pain in unspecified toe(s): Secondary | ICD-10-CM

## 2013-10-15 DIAGNOSIS — M79609 Pain in unspecified limb: Secondary | ICD-10-CM

## 2013-10-15 DIAGNOSIS — B351 Tinea unguium: Secondary | ICD-10-CM

## 2013-10-15 NOTE — Progress Notes (Signed)
Subjective:     Patient ID: [name], male   DOB: 1933/03/27, 80 y.o.   MRN: [id number]  HPI patient presents with nail disease 1-5 both feet that are thick and he cannot cut or take care of himself   Review of Systems     Objective:   Physical Exam Neurovascular status unchanged with thick yellow brittle nailbeds 1-5 both feet that are painful    Assessment:     Mycotic nail infection with pain 1-5 both feet    Plan:     Debride painful nailbeds 1-5 both feet with no iatrogenic bleeding noted

## 2013-11-15 ENCOUNTER — Ambulatory Visit: Payer: Self-pay | Admitting: Gastroenterology

## 2014-01-28 ENCOUNTER — Other Ambulatory Visit: Payer: Medicare Other

## 2014-02-19 DIAGNOSIS — G4733 Obstructive sleep apnea (adult) (pediatric): Secondary | ICD-10-CM | POA: Diagnosis not present

## 2014-03-21 DIAGNOSIS — E782 Mixed hyperlipidemia: Secondary | ICD-10-CM | POA: Diagnosis not present

## 2014-03-21 DIAGNOSIS — G4733 Obstructive sleep apnea (adult) (pediatric): Secondary | ICD-10-CM | POA: Diagnosis not present

## 2014-03-21 DIAGNOSIS — R059 Cough, unspecified: Secondary | ICD-10-CM | POA: Insufficient documentation

## 2014-03-21 DIAGNOSIS — I10 Essential (primary) hypertension: Secondary | ICD-10-CM | POA: Diagnosis not present

## 2014-03-21 DIAGNOSIS — I480 Paroxysmal atrial fibrillation: Secondary | ICD-10-CM | POA: Insufficient documentation

## 2014-03-21 DIAGNOSIS — R05 Cough: Secondary | ICD-10-CM | POA: Diagnosis not present

## 2014-03-23 ENCOUNTER — Emergency Department: Payer: Self-pay | Admitting: Emergency Medicine

## 2014-03-23 DIAGNOSIS — R21 Rash and other nonspecific skin eruption: Secondary | ICD-10-CM | POA: Diagnosis not present

## 2014-03-23 DIAGNOSIS — B356 Tinea cruris: Secondary | ICD-10-CM | POA: Diagnosis not present

## 2014-03-23 DIAGNOSIS — I10 Essential (primary) hypertension: Secondary | ICD-10-CM | POA: Diagnosis not present

## 2014-03-23 DIAGNOSIS — S301XXA Contusion of abdominal wall, initial encounter: Secondary | ICD-10-CM | POA: Diagnosis not present

## 2014-03-23 DIAGNOSIS — E119 Type 2 diabetes mellitus without complications: Secondary | ICD-10-CM | POA: Diagnosis not present

## 2014-03-28 ENCOUNTER — Emergency Department: Payer: Self-pay | Admitting: Emergency Medicine

## 2014-03-28 DIAGNOSIS — Z8546 Personal history of malignant neoplasm of prostate: Secondary | ICD-10-CM | POA: Diagnosis not present

## 2014-03-28 DIAGNOSIS — R079 Chest pain, unspecified: Secondary | ICD-10-CM | POA: Diagnosis not present

## 2014-03-28 DIAGNOSIS — N200 Calculus of kidney: Secondary | ICD-10-CM | POA: Diagnosis not present

## 2014-03-28 DIAGNOSIS — I714 Abdominal aortic aneurysm, without rupture: Secondary | ICD-10-CM | POA: Diagnosis not present

## 2014-03-28 DIAGNOSIS — K7689 Other specified diseases of liver: Secondary | ICD-10-CM | POA: Diagnosis not present

## 2014-03-28 DIAGNOSIS — E119 Type 2 diabetes mellitus without complications: Secondary | ICD-10-CM | POA: Diagnosis not present

## 2014-03-28 DIAGNOSIS — R1011 Right upper quadrant pain: Secondary | ICD-10-CM | POA: Diagnosis not present

## 2014-03-29 DIAGNOSIS — K7689 Other specified diseases of liver: Secondary | ICD-10-CM | POA: Diagnosis not present

## 2014-03-29 DIAGNOSIS — N200 Calculus of kidney: Secondary | ICD-10-CM | POA: Diagnosis not present

## 2014-03-29 DIAGNOSIS — Z8546 Personal history of malignant neoplasm of prostate: Secondary | ICD-10-CM | POA: Diagnosis not present

## 2014-03-29 DIAGNOSIS — I714 Abdominal aortic aneurysm, without rupture: Secondary | ICD-10-CM | POA: Diagnosis not present

## 2014-04-20 DIAGNOSIS — G4733 Obstructive sleep apnea (adult) (pediatric): Secondary | ICD-10-CM | POA: Diagnosis not present

## 2014-05-06 DIAGNOSIS — I499 Cardiac arrhythmia, unspecified: Secondary | ICD-10-CM | POA: Diagnosis not present

## 2014-05-06 DIAGNOSIS — E119 Type 2 diabetes mellitus without complications: Secondary | ICD-10-CM | POA: Diagnosis not present

## 2014-05-06 DIAGNOSIS — I714 Abdominal aortic aneurysm, without rupture: Secondary | ICD-10-CM | POA: Diagnosis not present

## 2014-05-06 DIAGNOSIS — R109 Unspecified abdominal pain: Secondary | ICD-10-CM | POA: Diagnosis not present

## 2014-05-14 NOTE — Discharge Summary (Signed)
Dates of Admission and Diagnosis:  Date of Admission 27-Sep-2013   Date of Discharge 28-Sep-2013   Admitting Diagnosis pneumonia and hypoxic respi failure   Final Diagnosis Hypoxic respi failure Pneumonia A fib    Chief Complaint/History of Present Illness a 81 year old obese African American male with a history of atrial fibrillation, hypertension, diabetes mellitus. He ate his supper, took a nap in front of the TV, woke up, started to experience chills, generalized body aches. Experienced some high-grade fever; however, did not check the temperature. He has been having cough with mild productive sputum. Started to experience severe shortness of breath. Concerning this, came to the Emergency Department. The patient initially had low oxygen saturations in high 80s. The patient was placed on 2 liters of oxygen with improvement. The patient is spiking a fever of 99.8. Otherwise, lab workup unremarkable. Chest x-ray did not show any obvious infiltrates other than atelectasis of the lung bases. The patient is started on 2 liters of oxygen.   Allergies:  No Known Allergies:   Routine Chem:  06-Sep-15 21:08   B-Type Natriuretic Peptide ([facility]) 269 (Result(s) reported on 26 Sep 2013 at 10:46PM.)  Glucose, Serum  188  BUN  21  Creatinine (comp)  1.92  Sodium, Serum 140  Potassium, Serum 4.6  Chloride, Serum 106  CO2, Serum 26  Calcium (Total), Serum  8.2  Anion Gap 8  Osmolality (calc) 287  eGFR (African American)  38  eGFR (Non-African American)  33 (eGFR values <79m/min/1.73 m2 may be an indication of chronic kidney disease (CKD). Calculated eGFR is useful in patients with stable renal function. The eGFR calculation will not be reliable in acutely ill patients when serum creatinine is changing rapidly. It is not useful in  patients on dialysis. The eGFR calculation may not be applicable to patients at the low and high extremes of body sizes, pregnant women, and vegetarians.)  Result  Comment POTASSIUM/CREATININE - Slight hemolysis, interpret results with  - caution.  Result(s) reported on 26 Sep 2013 at 09:46PM.  Cardiac:  06-Sep-15 21:08   Troponin I < 0.02 (0.00-0.05 0.05 ng/mL or less: NEGATIVE  Repeat testing in 3-6 hrs  if clinically indicated. >0.05 ng/mL: POTENTIAL  MYOCARDIAL INJURY. Repeat  testing in 3-6 hrs if  clinically indicated. NOTE: An increase or decrease  of 30% or more on serial  testing suggests a  clinically important change)  Routine Coag:  07-Sep-15 21:08   D-Dimer, Quantitative 1063 (INTERPRETATION <> Exclusion of Venous Thromboembolism (VTE) - OUTPATIENT ONLY       (Emergency Department or [place])             0-499 ng/ml (FEU)  : With a low to intermediate pretest                                  probability for VTE this test result                                  excludes the diagnosis of VTE.             > 499 ng/ml (FEU)  : VTE not excluded; additional work up                                  for  VTE is required. <> Testing on Inpatients and Evaluation of Disseminated Intravascular        Coagulation (DIC)             Reference Range:  0-499 ng/ml (FEU))  Routine Hem:  06-Sep-15 21:08   WBC (CBC) 6.9  RBC (CBC) 4.84  Hemoglobin (CBC) 14.2  Hematocrit (CBC) 45.0  Platelet Count (CBC) 170 (Result(s) reported on 26 Sep 2013 at 09:30PM.)  MCV 93  MCH 29.4  MCHC  31.6  RDW  15.3   PERTINENT RADIOLOGY STUDIES: XRay:    06-Sep-15 22:09, Chest PA and Lateral  Chest PA and Lateral   REASON FOR EXAM:    patient presenting with shortness of breath  COMMENTS:       PROCEDURE: DXR - DXR CHEST PA (OR AP) AND LATERAL  - Sep 26 2013 10:09PM     CLINICAL DATA:  Difficulty breathing.  Shortness of breath.    EXAM:  CHEST  2 VIEW    COMPARISON:  06/12/2013    FINDINGS:  Shallow inspiration with elevation of left hemidiaphragm.  Atelectasis in the lung bases. Cardiac enlargement. Pulmonary  vascularity is upper limits of  normal. Small bilateral pleural  effusions. No pneumothorax.     IMPRESSION:  Shallow inspiration with atelectasis in the lung bases. Cardiac  enlargement without significant vascular congestion.      Electronically Signed    By: Lucienne Capers M.D.    On: 09/26/2013 22:41         Verified By: Larita Fife.,    07-Sep-15 14:58, Chest PA and Lateral  Chest PA and Lateral   REASON FOR EXAM:    compare - pneumonia  COMMENTS:       PROCEDURE: DXR - DXR CHEST PA (OR AP) AND LATERAL  - Sep 27 2013  2:58PM     CLINICAL DATA:  Followup pneumonia.    EXAM:  CHEST  2 VIEW    COMPARISON:  09/26/2013 and 06/12/2013.    FINDINGS:  The hypoinflated lungs with continued 90 density over the left base  as well as mild prominence of the perihilar markings. No evidence of  effusion. Mild stable cardiomegaly. Remainder of the exam is  unchanged.     IMPRESSION:  Findings suggesting mild vascular congestion. Stable left basilar  atelectasis.    Mild stable cardiomegaly.      Electronically Signed    By: Marin Olp M.D.    On: 09/27/2013 15:01       Verified By: Pearletha Alfred, M.D.,  [place]:    07-Sep-15 18:02, [place] Color Flow Doppler Low Extrem Bilat (Legs)  [place] Color Flow Doppler Low Extrem Bilat (Legs)   REASON FOR EXAM:    Hypoxia- occassional  leg pain  COMMENTS:       PROCEDURE: [place]  - US DOPPLER LOW EXTR BILATERAL  - Sep 27 2013  6:02PM     CLINICAL DATA:  Occasional leg pain    EXAM:  BILATERAL LOWER EXTREMITY VENOUS DUPLEX ULTRASOUND    TECHNIQUE:  Doppler venous assessment of the bilateral lower extremity deep  venous system was performed, including characterization of spectral  flow, compressibility, and phasicity.  COMPARISON:  None.    FINDINGS:  There is complete compressibility of the bilateral common femoral,  femoral, and popliteal veins. Doppler analysis demonstrates  respiratory phasicity and augmentation of flow upon calf  compression. No  obvious superficial vein or calf vein thrombosis.  IMPRESSION:  No evidence of lower extremity DVT.      Electronically Signed    By: Maryclare Bean M.D.    On: 09/27/2013 18:34     Verified By: Jamas Lav, M.D.,  [place]:    06-Sep-15 22:09, Chest PA and Lateral  PACS Image     07-Sep-15 14:58, Chest PA and Lateral  PACS Image     07-Sep-15 18:02, [place] Color Flow Doppler Low Extrem Bilat (Legs)  PACS Image    Pertinent Past History:  Pertinent Past History 1.  Atrial fibrillation.  2.  Chronic kidney disease.  3.  Hypertension.  4.  Diabetes mellitus.   Hospital Course:  Hospital Course a 81 year old male who comes with sudden onset of the chills, generalized weakness, has been having cough.  1.  Pneumonia:   chest x-ray does not show any obvious infiltrates on admission- but on repeat- left lower lobe atelactasis- may be infiltrates. crackles are heard on the left lower lobe. Rocephin and Zithromax.  High D Dimer-negetive Doppler study. 2.  Hypoxic respiratory failure, most likely secondary to developing pneumonia. Continue with antibiotics and follow up.    now on room air. 3.  Chronic kidney disease, stable.  4.  History of atrial fibrillation. Rate is controlled on Cardizem.  5.  Debility, involve with physical therapy. suggested to have home assistance.   Condition on Discharge Stable   Code Status:  Code Status Full Code   DISCHARGE INSTRUCTIONS HOME MEDS:  Medication Reconciliation: Patient's Home Medications at Discharge:     Medication Instructions  glimepiride 1 mg tablet  1 tab(s) orally once a day with a meal for diabetes. (lunchtime)   vitamin d3 2000 intl units oral tablet  1 tab(s) orally once a day   cartia xt 120 mg/24 hours oral capsule, extended release  1 cap(s) orally once a day   vitamin b12 1000 mcg oral tablet  2 tab(s) orally once a day   dabigatran 150 mg oral capsule  1 cap(s) orally 2 times a day   sotalol 80 mg oral tablet  1  tab(s) orally 2 times a day   ceftin 500 mg oral tablet  1 tab(s) orally 2 times a day x 4 days     Physician's Instructions:  Diet Low Sodium  Carbohydrate Controlled (ADA) Diet   Activity Limitations As tolerated   Return to Work Not Applicable   Time frame for Follow Up Appointment 1-2 weeks  PMD     Clayborn Bigness M(Family Physician): [facility], [address], [place], [place] [address], [place]  Electronic Signatures: Vaughan Basta (MD)  (Signed 11-Sep-15 15:00)  Authored: ADMISSION DATE AND DIAGNOSIS, CHIEF COMPLAINT/HPI, Allergies, PERTINENT LABS, PERTINENT RADIOLOGY STUDIES, PERTINENT PAST HISTORY, HOSPITAL COURSE, DISCHARGE INSTRUCTIONS HOME MEDS, PATIENT INSTRUCTIONS, Follow Up Physician   Last Updated: 11-Sep-15 15:00 by Vaughan Basta (MD)

## 2014-05-14 NOTE — Discharge Summary (Signed)
PATIENT NAME:  [name], [name] MR#:  [id number] DATE OF BIRTH:  1933-05-27  DATE OF ADMISSION:  05/27/2013 DATE OF DISCHARGE:  05/29/2013  For a detailed note, please take a look at the history and physical done on admission by Dr. Benjie Karvonen.  DIAGNOSES AT DISCHARGE:  Is as follows:  1.  Acute renal failure, likely secondary to orthostatic hypotension.  2.  Bradycardia, now resolved.  3.  History of chronic atrial fibrillation.  4.  Orthostatic hypotension.   DIET:  The patient is being discharged on a low-sodium, American diabetic Association diet.   ACTIVITY:  As tolerated.   FOLLOW-UP:  Dr. Serafina Royals in the next 1 to 2 weeks, plus also follow up with Dr. Clayborn Bigness in the next 1 to 2 weeks.  DISCHARGE MEDICATIONS:  Glimepiride 1 mg daily, vitamin D3 2000 international units daily, sotalol 80 mg twice daily, vitamin B12 1000 mcg daily, Pradaxa 75 mg twice daily.   [place] COURSE:  Dr. Serafina Royals from cardiology.   PERTINENT STUDIES DONE DURING THE HOSPITAL COURSE:  Are as follows:  A CT scan of the head done without contrast showing no acute intracranial process.   HOSPITAL COURSE:  This is a 81 year old male who presented to the hospital with dizziness and lethargy and noted to be in acute renal failure along with orthostatic hypotension.  1.  Acute renal failure.  This was likely prerenal acute renal failure secondary to the orthostasis and dehydration.  The patient was hydrated with IV fluids and post hydration the patient's orthostasis has improved and his renal function is almost close to baseline.  His creatinine on admission was 2.4.  It is down to 1.6 on the day of discharge.  2.  Bradycardia.  This was likely secondary to the rate controlling meds the patient was taking for his A. Fib. complicated with underlying chronic renal failure.  The patient's Cardizem and sotalol were held in the hospital.  His bradycardia since then has improved.  At this  point, I am discontinuing his Cardizem, although he will continue his sotalol for rate control for his A. Fib.  He was ambulated and the heart rate responded appropriately with going up to the 80s.  3.  Orthostatic hypotension.  This was likely related to dehydration and poor by mouth intake.  The patient was hydrated with IV fluids and his orthostasis since then has improved.  4.  Hypertension.  The patient remained hemodynamically stable in the hospital post hydration and he will resume his sotalol as stated.  At this point, I am discontinuing his Cardizem.  5.  Diabetes.  The patient was maintained on his glimepiride.  He will resume that.  6.  Chronic atrial fibrillation.  The patient remained rate controlled.  He will continue his sotalol.  I did renally dose his Pradaxa and reduced it from 150 mg twice daily to 75 mg twice daily upon discharge.   CODE STATUS:  THE PATIENT IS A FULL CODE.   TIME SPENT:  40 minutes.    ____________________________ Belia Heman. Verdell Carmine, MD vjs:ea D: 05/29/2013 15:34:21 ET T: 05/30/2013 00:24:23 ET JOB#: [id number]  cc: Belia Heman. Verdell Carmine, MD, <Dictator> Corey Skains, MD Lavera Guise, MD Henreitta Leber MD ELECTRONICALLY SIGNED 06/02/2013 14:04

## 2014-05-14 NOTE — Consult Note (Signed)
   Present Illness 81 yo male with history of afib treated with sotolol 160 bid, cardizem cd for rate control and pradaxa for chronic anticoagulation. Pt states he has sharp stabbing pains in his chest and abdomen. Nonexertional. Pain is somewhat atypical. Ruled out for mi. His pain has improved and he feels he is at his baseline. He reports compliance with his meds and states discomfort is nonexertional   Physical Exam:  GEN no acute distress   HEENT PERRL, hearing intact to voice   NECK supple   RESP normal resp effort  no use of accessory muscles   CARD Irregular rate and rhythm  Tachycardic   ABD normal BS   LYMPH negative neck   EXTR negative cyanosis/clubbing   SKIN normal to palpation   NEURO cranial nerves intact   PSYCH A+O to time, place, person   Review of Systems:  Subjective/Chief Complaint chest pain, sharp and stabbing   General: No Complaints   Skin: No Complaints   ENT: No Complaints   Eyes: No Complaints   Neck: No Complaints   Respiratory: No Complaints   Cardiovascular: Chest pain or discomfort   Gastrointestinal: No Complaints   Genitourinary: No Complaints   Vascular: No Complaints   Musculoskeletal: No Complaints   Neurologic: No Complaints   Hematologic: No Complaints   Endocrine: No Complaints   Psychiatric: No Complaints   Review of Systems: All other systems were reviewed and found to be negative   Medications/Allergies Reviewed Medications/Allergies reviewed   Family & Social History:  Family and Social History:  Family History Non-Contributory   Social History negative tobacco   EKG:  Interpretation afib    No Known Allergies:    Impression Chest pain does not appear ischemic. Will continue to attempt rate control. Would continue sotolol at 160 bid; pradaxa at 150 bid as you are doing. Will follow heart rate on cardizem at this dose. may need to increase dose based on rate. Does not appear ischemic at present.    Plan 1. Continue with sotolol at 160 bid  2. Continue to anticoagulate with dabigitran at 150 bid 3. Continue to attempt rate control with cardizem 4. Consider discharge in am if rate controlled and no further sx 5. Will follow as outpatient.   Electronic Signatures: Teodoro Spray (MD)  (Signed 01-Jun-15 07:31)  Authored: General Aspect/Present Illness, History and Physical Exam, Review of System, Family & Social History, EKG , Allergies, Impression/Plan   Last Updated: 01-Jun-15 07:31 by Teodoro Spray (MD)

## 2014-05-14 NOTE — Consult Note (Signed)
Brief Consult Note: Diagnosis: 81 yo with ckd, chronic afib admitted with cp and afib with rvr. Ruled out for mi thus far.   Patient was seen by consultant.   Recommend further assessment or treatment.   Comments: 81 yo male with history of afib treated with sotolol 160 bid, cardizem cd for rate cotnrol and pradaxa for chronic anticoagulation. Pt states he has sharp stabbing pains in his chest and abdomen. Nonexertional. Pain is somewhat atypical. Ruled out for mi. Would continue sotolol at 160 bid; pradaxa at 150 bid as you are doing. Will follow heart rate on cardizem at this doe. may need to increase dose based on rate. Does not appear ischemic at present.  Electronic Signatures: Teodoro Spray (MD)  (Signed 23-May-15 09:23)  Authored: Brief Consult Note   Last Updated: 23-May-15 09:23 by Teodoro Spray (MD)

## 2014-05-14 NOTE — Discharge Summary (Signed)
PATIENT NAME:  [name], [name] MR#:  [id number] DATE OF BIRTH:  09-07-1933  DATE OF ADMISSION:  06/12/2013 DATE OF DISCHARGE:  06/13/2013  REASON FOR ADMISSION: Chest pain.   DISCHARGE DIAGNOSES:  1. Chest pain, which was atypical and likely related to gastrointestinal source.  2. Atrial fibrillation.  3. Dyspepsia.  4. Diabetes.  5. Gastroesophageal reflux.  6. Acute kidney injury, resolved.  7. Hypertension.  8. Chronic anticoagulation with Pradaxa.   DISPOSITION: Home.   FOLLOWUP:  1. Follow up with GI, Dr. Candace Cruise, for possible upper endoscopy, as the patient had significant relief of his chest pain after getting a gastric cocktail.  2. Follow up with Dr. [name] in the next 1 to 2 weeks.  3. Clayborn Bigness in the next 1 to 2 weeks.   MEDICATIONS AT DISCHARGE:  1. Glimepiride 1 mg once a day before meals.  2. Vitamin D 2000 units once a day. 3. Cartia 120 mg XT once a day.  4. Sotalol 80 mg 2 tablets twice daily.  5. Vitamin B12 two tablets once a day.  6. Pradaxa 150 mg twice daily.  7. Omeprazole 20 mg twice daily. 8. Maalox Extra Strength 30 mL every 6 hours as needed for indigestion.  9. Carafate 1 gram 3 times a day before meals.   IMPORTANT RESULTS: Cardiac enzymes were done x3 that were negative. HDL was 39, LDL was 61. Creatinine was 2.01. His glucose was 164, his potassium was 4.7, CO2 26, that was on May 22nd. EKG did not show any significant changes of myocardial injury. Mostly atrial fibrillation with rapid ventricular response. CT of the head was done. No acute intracranial process. Chest x-ray mostly normal with some basilar atelectasis. Abdominal ultrasound revealed gallbladder essentially unremarkable with large cyst at the level of the liver, which has decreased in size from previous examinations.   HOSPITAL COURSE: The patient was admitted mostly with a complaint of chest pain. He is 81 years old. He has atrial fibrillation. He has chronic kidney disease. He has  diabetes, hypertension. His pain started as an acute onset, located on the right side of the chest, midclavicular line, and the epigastric area for the most. It occurred after eating. It was sharp, then became burning, then become pinprick. Associated with no other symptoms. The patient has been burping and felt indigestion. He was noted to have rapid heart rate, for which he was started back on diltiazem. This medication was recently stopped due to the patient's bradycardia. The patient was evaluated by cardiology, who agreed on continuing those medications. His cardiac enzymes were negative. He again had an ultrasound to rule out cholelithiasis, which was negative. He had a chest x-ray that only showed atelectasis. Once repeated with the PA and lateral, it showed no changes from previous study, left basilar opacity is likely atelectasis. The patient did not have any significant signs of pneumonia as far as cough or sputum or fevers. As far as his atrial fibrillation, heart rate was in the 140s on admission. After starting back on Cardizem, his heart rate normalized down to the 50s to 60s. Pradaxa was continued, dose was increased as his kidney function allowed.    For diabetes, the patient did not have any significant exacerbations of this. Blood sugar was overall in good control.   For his GERD, the patient is likely having issues with indigestion and significant reflux causing chest pain. The patient received 1 dose of gastric cocktail, and the pain completely disappeared. The patient  recommended to follow up with Dr. Candace Cruise for upper endoscopy, and he was given double dose of PPI to take home.   The patient did well during this hospitalization.  TIME SPENT: I spent about 45 minutes discharging this patient on the day of discharge.   ____________________________ Hartford Sink, MD rsg:lb D: 06/16/2013 07:33:43 ET T: 06/16/2013 08:20:44 ET JOB#: [id number]  cc: Emlyn Sink, MD,  <Dictator> Javier Docker. Ubaldo Glassing, MD Corey Skains, MD Lavera Guise, MD Lupita Dawn. Candace Cruise, MD Cristi Loron MD ELECTRONICALLY SIGNED 06/26/2013 11:02

## 2014-05-14 NOTE — Discharge Summary (Signed)
Dates of Admission and Diagnosis:  Date of Admission 12-Oct-2013   Date of Discharge 13-Oct-2013   Admitting Diagnosis chest pain   Final Diagnosis Chest pain- stress test negative Htn DM A fib- stopped sotolol as had some bradycardia in hospital- follow with cardiologist. Ex-smoker    Chief Complaint/History of Present Illness A 81 year old male with past history of atrial fibrillation, chronic kidney disease, hypertension, diabetes.  This morning, around 6, he went to the bathroom to have a bowel movement and he started having some chest tightness. He described the pain as 8 out of 10 and sharp, which was in the central and left chest and was also going to the left shoulder. He was feeling somewhat short of breath with that and so he just  cleaned himself, came and sat in the chair for a while, but the pain did not go away for almost an hour so finally he talked to his wife and she called an ambulance. She gave him 1 aspirin tablet. While he came to the Emergency Room he still had the pain and in the Emergency Room he was given 3 more aspirin tablets and was given morphine injection and says the pain is relieved by that. On further questioning he denies any palpitations, cough, but had some shortness of breath. He denies any injuries and he says that he also had some headache and neck pain with this. The headache is gone. The neck pain is still there a little bit with any movement.   Allergies:  No Known Allergies:   Hepatic:  22-Sep-15 07:32   Bilirubin, Total 0.3  Alkaline Phosphatase  128 (46-116 NOTE: New Reference Range 08/10/13)  SGPT (ALT) 29 (14-63 NOTE: New Reference Range 08/10/13)  SGOT (AST) 22  Total Protein, Serum 7.4  Albumin, Serum  3.0  Cardiology:  22-Sep-15 14:11   Echo Doppler REASON FOR EXAM:     COMMENTS:     PROCEDURE: [place] - ECHO DOPPLER COMPLETE(TRANSTHOR)  - Oct 12 2013  2:11PM   RESULT: Echocardiogram Report  Patient Name:   [name] Date  of Exam: 10/12/2013 Medical Rec #:  [id number]            Custom1: Date of Birth:  1933-12-21          Height:       74.0 in Patient Age:    9 years          Weight:       255.0 lb Patient Gender: M                 BSA:          2.41 m??  Indications: Angina Sonographer:    Sherrie Sport RDCS Referring Phys: Vaughan Basta  Sonographer Comments: The only view obtainable was apical. and  Technically difficult study due to poor echo windows.  Summary:  1. Left ventricular ejection fraction, by visual estimation, is 60 to  65%.  2. Normal global left ventricular systolic function. 2D AND M-MODE MEASUREMENTS (normal ranges within parentheses): Left Ventricle:          Normal IVSd (2D):      1.36 cm (0.7-1.1) LVPWd (2D):     1.32 cm (0.7-1.1) Aorta/LA:                  Normal LVIDd (2D):2.96 cm (3.4-5.7) Aortic Root (2D): 3.00 cm (2.4-3.7) LVIDs (2D):     2.05 cm  Left Atrium (2D): 4.90 cm (1.9-4.0) LV FS (2D):     30.7 %   (>25%) LV EF (2D):     60.0 %   (>50%)                                   Right Ventricle:                            RVd (2D): LV DIASTOLIC FUNCTION: MV Peak E: 0.49 m/s E/e' Ratio: 10.10 MV Peak A: 0.73 m/s Decel Time: 320 msec E/A Ratio: 0.68 SPECTRAL DOPPLER ANALYSIS (where applicable): Mitral Valve: MV P1/2 Time: 92.80 msec MVArea, PHT: 2.37 cm?? Aortic Valve: AoV Max Vel: 0.75 m/s AoV Peak PG: 2.2 mmHg AoV Mean PG: LVOT Vmax: 0.67 m/s LVOT VTI:  LVOT Diameter: 2.10 cm AoV Area, Vmax: 3.11 cm?? AoV Area, VTI:  AoV Area, Vmn: Tricuspid Valve and PA/RV Systolic Pressure: TR Max Velocity: 1.56 m/s RA  Pressure: 5 mmHg RVSP/PASP: 14.7 mmHg  PHYSICIAN INTERPRETATION: Left Ventricle: The left ventricular internal cavity size was normal. LV  septal wall thickness was normal. LV posterior wall thickness was normal.  Global LV systolic function was normal. Left ventricular ejection  fraction, by visual estimation, is 60 to 65%. Mitral Valve: The mitral  valve is not well seen. Trace mitral valve  regurgitation is seen. Tricuspid Valve: The tricuspid valve is notwell seen. Trivial tricuspid  regurgitation is visualized. The tricuspid regurgitant velocity is 1.56  m/s, and with an assumed right atrial pressure of 5 mmHg, the estimated  right ventricular systolic pressure is normal at 14.7 mmHg. Aortic Valve: The aortic valve was not well seen. [place] MD Electronically signed by [id number] Bar[address]ome Bill MD Signature Date/Time: 10/12/2013/4:23:40 PM  *** Final ***  IMPRESSION: .    Verified By: Teodoro Spray, M.D., MD  Routine Chem:  22-Sep-15 07:32   Glucose, Serum  164  BUN  24  Creatinine (comp)  2.03  Sodium, Serum 139  Potassium, Serum 4.3  Chloride, Serum 107  CO2, Serum 26  Calcium (Total), Serum 8.6  Anion Gap  6  Osmolality (calc) 285  eGFR (African American)  35  eGFR (Non-African American)  30 (eGFR values <69m/min/1.73 m2 may be an indication of chronic kidney disease (CKD). Calculated eGFR is useful in patients with stable renal function. The eGFR calculation will not be reliable in acutely ill patients when serum creatinine is changing rapidly. It is not useful in  patients on dialysis. The eGFR calculation may not be applicable to patients at the low and high extremes of body sizes, pregnant women, and vegetarians.)  Cholesterol, Serum 140  Triglycerides, Serum 139  HDL (INHOUSE) 43  VLDL Cholesterol Calculated 28  LDL Cholesterol Calculated 69 (Result(s) reported on 12 Oct 2013 at 10:25AM.)  Result Comment aptt - RESULTS VERIFIED BY REPEAT TESTING.  - NOTIFIED OF CRITICAL VALUE  - Notified Cathy Cox @ [phone] 10/12/13 [facility]  - READ-BACK PROCESS PERFORMED.  Result(s) reported on 12 Oct 2013 at 08:11AM.  23-Sep-15 04:15   Glucose, Serum 97  BUN  22  Creatinine (comp)  1.65  Sodium, Serum 138  Potassium, Serum 4.0  Chloride, Serum 106  CO2, Serum 27  Calcium (Total), Serum  8.2  Anion Gap  5   Osmolality (calc) 279  eGFR (African American)  45  eGFR (Non-African  American)  39 (eGFR values <8m/min/1.73 m2 may be an indication of chronic kidney disease (CKD). Calculated eGFR is useful in patients with stable renal function. The eGFR calculation will not be reliable in acutely ill patients when serum creatinine is changing rapidly. It is not useful in  patients on dialysis. The eGFR calculation may not be applicable to patients at the low and high extremes of body sizes, pregnant women, and vegetarians.)  Cardiac:  22-Sep-15 07:32   Troponin I < 0.02 (0.00-0.05 0.05 ng/mL or less: NEGATIVE  Repeat testing in 3-6 hrs  if clinically indicated. >0.05 ng/mL: POTENTIAL  MYOCARDIAL INJURY. Repeat  testing in 3-6 hrs if  clinically indicated. NOTE: An increase or decrease  of 30% or more on serial  testing suggests a  clinically important change)    11:24   Troponin I < 0.02 (0.00-0.05 0.05 ng/mL or less: NEGATIVE  Repeat testing in 3-6 hrs  if clinically indicated. >0.05 ng/mL: POTENTIAL  MYOCARDIAL INJURY. Repeat  testing in 3-6 hrs if  clinically indicated. NOTE: An increase or decrease  of 30% or more on serial  testing suggests a  clinically important change)    15:56   Troponin I < 0.02 (0.00-0.05 0.05 ng/mL or less: NEGATIVE  Repeat testing in 3-6 hrs  if clinically indicated. >0.05 ng/mL: POTENTIAL  MYOCARDIAL INJURY. Repeat  testing in 3-6 hrs if  clinically indicated. NOTE: An increase or decrease  of 30% or more on serial  testing suggests a  clinically important change)  Routine UA:  22-Sep-15 14:45   Color (UA) Yellow  Clarity (UA) Clear  Glucose (UA) 50 mg/dL  Bilirubin (UA) Negative  Ketones (UA) Negative  Specific Gravity (UA) 1.015  Blood (UA) 1+  pH (UA) 5.0  Protein (UA) Negative  Nitrite (UA) Negative  Leukocyte Esterase (UA) Negative (Result(s) reported on 12 Oct 2013 at 03:41PM.)  RBC (UA) 3 /HPF  WBC (UA) 1 /HPF  Bacteria  (UA) NONE SEEN  Epithelial Cells (UA) NONE SEEN  Mucous (UA) PRESENT (Result(s) reported on 12 Oct 2013 at 03:41PM.)  Routine Coag:  22-Sep-15 07:32   Prothrombin  19.0  INR 1.6 (INR reference interval applies to patients on anticoagulant therapy. A single INR therapeutic range for coumarins is not optimal for all indications; however, the suggested range for most indications is 2.0 - 3.0. Exceptions to the INR Reference Range may include: Prosthetic heart valves, acute myocardial infarction, prevention of myocardial infarction, and combinations of aspirin and anticoagulant. The need for a higher or lower target INR must be assessed individually. Reference: The Pharmacology and Management of the Vitamin K  antagonists: the seventh ACCP Conference on Antithrombotic and Thrombolytic Therapy. CAQTMA.2633Sept:126 (3suppl): [id number] A HCT value >55% may artifactually increase the PT.  In one study,  the increase was an average of 25%. Reference:  "Effect on Routine and Special Coagulation Testing Values of Citrate Anticoagulant Adjustment in Patients with High HCT Values." American Journal of Clinical Pathology 2006;126:400-405.)  Activated PTT (APTT)  51.3 (A HCT value >55% may artifactually increase the APTT. In one study, the increase was an average of 19%. Reference: "Effect on Routine and Special Coagulation Testing Values of Citrate Anticoagulant Adjustment in Patients with High HCT Values." American Journal of Clinical Pathology 2006;126:400-405.)  Routine Hem:  22-Sep-15 07:32   WBC (CBC) 8.8  RBC (CBC) 5.17  Hemoglobin (CBC) 15.1  Hematocrit (CBC) 47.4  Platelet Count (CBC) 194 (Result(s) reported on 12 Oct 2013 at 07:54AM.)  MCV  92  MCH 29.2  MCHC  31.9  RDW  15.3  23-Sep-15 04:15   WBC (CBC) 5.9  RBC (CBC) 4.59  Hemoglobin (CBC) 13.4  Hematocrit (CBC) 42.2  Platelet Count (CBC) 159  MCV 92  MCH 29.3  MCHC  31.8  RDW  15.3  Neutrophil % 41.6  Lymphocyte % 40.2   Monocyte % 14.1  Eosinophil % 3.5  Basophil % 0.6  Neutrophil # 2.4  Lymphocyte # 2.4  Monocyte # 0.8  Eosinophil # 0.2  Basophil # 0.0 (Result(s) reported on 13 Oct 2013 at 05:23AM.)   PERTINENT RADIOLOGY STUDIES: XRay:    22-Sep-15 07:45, Chest PA and Lateral  Chest PA and Lateral   REASON FOR EXAM:    Chest Pain  COMMENTS:       PROCEDURE: DXR - DXR CHEST PA (OR AP) AND LATERAL  - Oct 12 2013  7:45AM     CLINICAL DATA:  Chest pain.  Dizziness.    EXAM:  CHEST  2 VIEW    COMPARISON:  09/27/2013    FINDINGS:  Heart size and pulmonary vascularity are normal and the lungs are  clear except for slight scarring at the left base laterally. No  effusions. No acute osseous abnormality.     IMPRESSION:  No active cardiopulmonary disease.      Electronically Signed    By: Faith Rogue.D.    On: 10/12/2013 07:52         Verified By: Larey Seat, M.D.,  [place]:  PACS Image     23-Sep-15 10:47, NM MYOCARDIAL SCAN  PACS Image    Pertinent Past History:  Pertinent Past History 1.  Atrial fibrillation.  2.  Chronic kidney disease.  3.  Hypertension.  4.  Diabetes   Hospital Course:  Hospital Course A 81 year old male with past medical history of longterm smoking in the past, hypertension, diabetes, and atrial fibrillation with chronic renal failure came to the Emergency Room after having chest tightness today in the morning with some nausea and shortness of breath.  1.  Chest pain.   at high-risk for coronary artery disease, admitted to telemetry floor for observation, negative serial troponins and echocardiogram  on nitroglycerin tablets on an as-needed basis. Currently the patient is pain free.    Appreciated cardiology help- stress test done- negative. 2.  Atrial fibrillation. Rate is under control. He is still in atrial fibrillation.  We will continue his home medications, sotalol and diltiazem, and continue Pradaxa.  3.  Hypertension. Blood pressure is  under control. We will continue sotalol and Cardizem.  4.  Diabetes. continue glimepiride and insulin sliding scale for better coverage.  5. Ac on Ch renal failure- creatinin improved today- baseline is around 1.6- same.   Condition on Discharge Stable   Code Status:  Code Status Full Code   DISCHARGE INSTRUCTIONS HOME MEDS:  Medication Reconciliation: Patient's Home Medications at Discharge:     Medication Instructions  glimepiride 1 mg tablet  1 tab(s) orally once a day with a meal for diabetes. (lunchtime)   cartia xt 120 mg/24 hours oral capsule, extended release  1 cap(s) orally once a day   pradaxa 150 mg oral capsule  1 cap(s) orally 2 times a day   pantoprazole 40 mg oral delayed release tablet  1 tab(s) orally once a day    PRESCRIPTIONS: PRINTED AND PLACED ON CHART   Physician's Instructions:  Diet Low Sodium  Low Fat, Low Cholesterol  Carbohydrate Controlled (ADA)  Diet   Diet Consistency Regular Consistency   Activity Limitations None   Return to Work Not Applicable   Time frame for Follow Up Appointment 1-2 weeks  cardiology   Other Comments follow with cardiology clinic in 1-2 weeks.     Bartholome Bill A(Consultant): [facility], [address], [place], [place] [address], [place]   Clayborn Bigness M(Family Physician): [facility], [address] [place],  [address], [place] [phone]  Electronic Signatures: Vaughan Basta (MD)  (Signed 25-Sep-15 13:17)  Authored: ADMISSION DATE AND DIAGNOSIS, CHIEF COMPLAINT/HPI, Allergies, PERTINENT LABS, PERTINENT RADIOLOGY STUDIES, PERTINENT PAST [place], PATIENT INSTRUCTIONS, Follow Up Physician   Last Updated: 25-Sep-15 13:17 by Vaughan Basta (MD)

## 2014-05-14 NOTE — H&P (Signed)
PATIENT NAME:  [name], [name] MR#:  [id number] DATE OF BIRTH:  Nov 15, 1933  DATE OF ADMISSION:  10/12/2013  PRIMARY CARE PHYSICIAN:  Lavera Guise, MD.   PRIMARY CARDIOLOGIST:  Corey Skains, MD.  REFERRING EMERGENCY ROOM PHYSICIAN:  Joanne Gavel, MD.  CHIEF COMPLAINT:  Chest pain.   HISTORY OF PRESENTING ILLNESS:  A 81 year old male with past history of atrial fibrillation, chronic kidney disease, hypertension, diabetes.  This morning, around 6, he went to the bathroom to have a bowel movement and he started having some chest tightness. He described the pain as 8 out of 10 and sharp, which was in the central and left chest and was also going to the left shoulder. He was feeling somewhat short of breath with that and so he just  cleaned himself, came and sat in the chair for a while, but the pain did not go away for almost an hour so finally he talked to his wife and she called an ambulance. She gave him 1 aspirin tablet. While he came to the Emergency Room he still had the pain and in the Emergency Room he was given 3 more aspirin tablets and was given morphine injection and says the pain is relieved by that. On further questioning he denies any palpitations, cough, but had some shortness of breath. He denies any injuries and he says that he also had some headache and neck pain with this. The headache is gone. The neck pain is still there a little bit with any movement.   REVIEW OF SYSTEMS:  CONSTITUTIONAL: Negative for fever, fatigue, weakness, pain or weight loss.  EYES: No blurring, double vision, discharge or redness.  EARS, NOSE, THROAT: No tinnitus, ear pain or hearing loss.  RESPIRATORY: No cough, wheezing, but has some shortness of breath.  CARDIOVASCULAR: The patient has chest pain. No orthopnea, edema, arrhythmia, palpitation.  GASTROINTESTINAL: No nausea, vomiting, diarrhea, abdominal pain.  GENITOURINARY: No dysuria, hematuria, increased frequency.  ENDOCRINE: No heat or cold  intolerance.  SKIN: No swelling, no acne or rashes.  MUSCULOSKELETAL: No pain or swelling in the joints.  NEUROLOGICAL: No numbness, weakness, tremor or vertigo.  PSYCHIATRIC: No anxiety, insomnia, bipolar disorder.   PAST MEDICAL HISTORY:  1.  Atrial fibrillation.  2.  Chronic kidney disease.  3.  Hypertension.  4.  Diabetes   SOCIAL HISTORY: He was a smoker, smoked 1 pack of cigarettes per day for many years, but stopped smoking almost 10 years now. He was a drinker on the weekends, but he also stopped that many years ago. No illegal drug use. 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 He is not a very active person as far as exercising at all but he is able to walk without any support. He says that for the last few days when he tried to walk he was having on and off some chest tightness, but not as severe as today.   FAMILY HISTORY: History of diabetes and hypertension in the family.   HOME MEDICATIONS:  1.  Sotalol 80 mg tablet 2 times a day.  2.  Pradaxa 150 mg oral tablet 2 times a day.  3.  Glimepiride 1 mg oral tablet once a day.  4.  Cartia XT 120 mg 24-hour extended release once a day.   PHYSICAL EXAMINATION:  VITAL SIGNS: In ER, temperature was 97.6, pulse 110, blood pressure was 139/96, and pulse oximetry 96% on room air.  GENERAL: The patient  is fully alert and oriented to time, place, and person. Does not appear in acute distress. He is obese.  HEENT: Head and neck atraumatic. Conjunctivae pink. Oral mucosa moist.  NECK: Supple. No JVD.  RESPIRATORY: Bilateral equal and clear air entry.  CARDIOVASCULAR: S1, S2 present, irregular, no murmur.  ABDOMEN: Soft, nontender, bowel sounds present. No organomegaly.  SKIN: No rashes.  EXTREMITIES: Legs: No edema. Joints: No swelling or tenderness. NEUROLOGICAL: Power 5 out of 5, follows commands. Moves all 4 limbs. No tremor or rigidity.  PSYCHIATRIC: Does not appear in any acute psychiatric illness.    IMPORTANT LABORATORY RESULTS:  Glucose 164, BUN 24, creatinine 2.03, sodium 139, potassium 4.3, chloride 107, CO2 is 26, calcium is 8.6. Total protein 7.4, albumin 3, bilirubin 0.3, alkaline phosphate 128, SGOT 22 and SGPT 29. Troponin less than 0.02. WBC 8.8, hemoglobin 15.1, platelet count 194,000, and MCV 92. INR 1.6. Prothrombin time 19, and activated PTT 51.3.   Chest x-ray, PA and lateral is done. No acute cardiopulmonary disease.   EKG is done in the ER which shows atrial fibrillation with ventricular rate of 116, but no ST-T changes.   ASSESSMENT AND PLAN:  A 81 year old male with past medical history of longterm smoking in the past, hypertension, diabetes, and atrial fibrillation with chronic renal failure came to the Emergency Room after having chest tightness today in the morning with some nausea and shortness of breath.  1.  Chest pain. As the patient is at high-risk for coronary artery disease we will admit him to telemetry floor for observation, follow serial troponins and do an echocardiogram and call cardiology consult. He does not have any work-up done in the hospital, but has been following closely with Dr. [name], so he might have some work-up in the office. I spoke to Dr. Ubaldo Glassing and he will take of further work-up from the cardiology point of view in hospital. Meanwhile, we will continue him on the Pradaxa and put him on nitroglycerin tablets on an as-needed basis. Currently the patient is pain free.  2.  Atrial fibrillation. Rate is under control. He is still in atrial fibrillation.  We will continue his home medications, sotalol and diltiazem, and continue Pradaxa.  3.  Hypertension. Blood pressure is under control. We will continue sotalol and Cardizem.  4.  Diabetes. We will continue glimepiride and insulin sliding scale for better coverage.  5.  As the patient is at high risk for coronary artery disease we will check his lipid panel for better control.   I discussed this  with the patient, his wife and Dr. Ubaldo Glassing on the phone.   Total time spent on this admission: 45 minutes.     ____________________________ Ceasar Lund Anselm Jungling, MD [id number] D: 10/12/2013 09:59:13 ET T: 10/12/2013 10:35:29 ET JOB#: [id number]  cc: Ceasar Lund. Anselm Jungling, MD, <Dictator>             Lavera Guise, MD             Corey Skains, MD  Vaughan Basta MD ELECTRONICALLY SIGNED 10/23/2013 12:48

## 2014-05-14 NOTE — H&P (Signed)
PATIENT NAME:  [name], [name] MR#:  [id number] DATE OF BIRTH:  1933/11/22  DATE OF ADMISSION:  06/12/2013  REFERRING PHYSICIAN: Marjean Donna, MD  PRIMARY CARE PHYSICIAN: Clayborn Bigness, MD  CARDIOLOGIST: Dr. [name] of [facility]   CHIEF COMPLAINT: Chest pain.  HISTORY OF PRESENT ILLNESS: This is a 81 year old African American gentleman with a history of atrial fibrillation, chronic kidney disease, diabetes, as well as hypertension who is presenting with chest pain. Had acute episode of chest pain, which was located over the right side of the chest, midclavicular line. This occurred originally after eating. He noticed the pain and quality to be sharp with radiation through the entirety of the right chest up and down with no associated symptoms including shortness of breath and nausea, diaphoresis. No worsening or relieving factors. Of note, he did have a recent hospitalization with acute kidney injury on chronic kidney disease which prompted a change of medications decreasing his Pradaxa to 75 mg b.i.d. from 150 mg. He also stopped his Cardia, however, subsequently return to the hospital for tachycardia and had this medication restarted. Currently he has no complaints.  REVIEW OF SYSTEMS:  CONSTITUTIONAL: Denies fever, fatigue, weakness. EYES: Denies blurry, double vision, or eye pain.  HEENT: Denies tinnitus, ear pain, hearing loss.  RESPIRATORY: Denies cough, wheeze, shortness of breath. CARDIOVASCULAR: Positive for chest pain as described above. Denies palpitations or edema.  GASTROINTESTINAL: Denies nausea, vomiting, diarrhea, abdominal pain.  GENITOURINARY: Denies dysuria or hematuria.  ENDOCRINE: Denies nocturia or thyroid problems.  HEMATOLOGIC AND LYMPHATIC: Denies easy bruising, bleeding. SKIN: Denies rash or lesions. MUSCULOSKELETAL: Denies pain in neck, back, shoulders, knees, hip, or arthritic symptoms.  NEUROLOGIC: He denies paralysis or paresthesias.  PSYCHIATRIC:  Denies anxiety or depressive symptoms.   Otherwise, a full review of systems performed by me is negative.   PAST MEDICAL HISTORY: Atrial fibrillation and chronic kidney disease, hypertension, diabetes.   SOCIAL HISTORY: Denies any alcohol, tobacco, or drug usage.   FAMILY HISTORY: Denies any known cardiovascular disorders.   ALLERGIES: No known drug allergies.   HOME MEDICATIONS: Include Cardia 120 mg p.o. daily, sotalol 80 mg 2 tablets p.o. b.i.d., Pradaxa 75 mg p.o. b.i.d., glimepiride 1 mg p.o. daily, esomeprazole 20 mg p.o. daily, vitamin B12 at 1000 mg p.o. 2 tablets daily, vitamin D3 at 2000 international units p.o. daily.   PHYSICAL EXAMINATION:  VITAL SIGNS: Temperature 97.7, heart rate currently 102, respirations 30, blood pressure 139/81, saturating 99% on supplemental O2. Weight 117 kg, BMI 35.  GENERAL: Well-nourished, well-developed, African American gentleman, currently in no acute distress.  HEAD: Normocephalic, atraumatic.  EYES: Pupils equal, round, reactive to light. Extraocular muscles intact. No scleral icterus.  MOUTH: Moist mucous membranes.  Dentition intact. No abscess noted.  EARS, NOSE, AND THROAT: Clear without exudates. No external lesions.  NECK: Supple. No thyromegaly. No nodules. No JVD.  PULMONARY: Clear to auscultation bilaterally without wheezes, rales, or rhonchi, no use of accessory muscles. Good respiratory effort.  CHEST:  Nontender on palpation.  CARDIOVASCULAR: S1, S2, irregular rate, irregular rhythm. No murmurs, rubs, or gallops. No edema. Pedal pulses 2+ bilaterally.  GASTROINTESTINAL: Soft, nontender, nondistended. No masses. Positive bowel sounds. No hepatosplenomegaly.  MUSCULOSKELETAL: No swelling, clubbing, or edema. Range of motion is full in all extremities. NEUROLOGIC: Cranial nerves II-XII intact. No gross neurologic deficits. Sensation intact. Reflexes intact.  SKIN: No ulcerations, lesions, no rashes, or cyanosis. Skin warm and dry.  Turgor intact. PSYCHIATRIC: Mood and affect within normal limits. The patient  awake, alert and oriented x 3. Insight and judgment intact.  LABORATORY DATA: EKG revealing atrial fibrillation, rapid ventricular response, heart rate in the low 100s.   Remainder of laboratory data: Sodium 130, potassium 4.7, chloride 108, bicarbonate 26, BUN 26, creatinine 2.01, glucose 164. LFTs: Albumin 2.9, alkaline phosphatase 133, AST 40, otherwise within normal limits. Troponin I less than 0.02. Urine drug screen within normal limits. WBC 8.3, hemoglobin 13.8, platelets 210,000. Urinalysis negative for evidence of infection.   He had a CT head performed, no acute intracranial process. Chest x-ray performed with some mild bibasilar atelectasis. Abdominal ultrasound performed revealing gallbladder which was essentially unremarkable and noted large cystic lesion within the liver which is actually decreased in size.   ASSESSMENT AND PLAN: A 81 year old gentleman with a history of atrial fibrillation, chronic kidney disease, diabetes, presenting with chest pain. 1. Chest pain. Admit to telemetry under observational status. Trend cardiac enzymes x 3. We will consult cardiology as he recently underwent multiple medication changes, mainly for his atrial fibrillation.  2. Atrial fibrillation with rapid ventricular response, heart rate 140s maximum documented in the Emergency Department, received Cardizem. Now rate controlled with heart rate less than 120s. We will continue Pradaxa for anticoagulation and can actually increase to 150 mg p.o. b.i.d. as creatinine clearance is greater than 30. It is actually greater than 50 currently.  3. Diabetes. Hold p.o. agents. Add insulin sliding scale. 4. Gastroesophageal reflux disease, PPI therapy. 5. Venous thromboembolism prophylaxis with Pradaxa.  CODE STATUS: The patient is full code.   TIME SPENT: 45 minutes.   ____________________________ Aaron Mose. Hower,  MD dkh:lt D: 06/12/2013 03:14:00 ET T: 06/12/2013 05:01:52 ET JOB#: [id number]  cc: Aaron Mose. Hower, MD, <Dictator> DAVID Woodfin Ganja MD ELECTRONICALLY SIGNED 06/12/2013 20:19

## 2014-05-14 NOTE — Consult Note (Signed)
PATIENT NAME:  [name], PENNICK MR#:  [id number] DATE OF BIRTH:  1933-06-20  DATE OF CONSULTATION:  05/28/2013  REFERRING PHYSICIAN:  Dr. Benjie Karvonen CONSULTING PHYSICIAN:  Corey Skains, MD  REASON FOR CONSULTATION: Atrial fibrillation with acute on chronic kidney disease, diabetes, hypertension, bradycardia, and atrial fibrillation with shortness of breath.   CHIEF COMPLAINT: The patient has had chest pain and shortness of breath.  HISTORY OF PRESENT ILLNESS: The patient is a 80 year old male with known atrial fibrillation maintaining normal sinus rhythm on sotalol and on Pradaxa at this time who has had maintenance of normal rhythm at this time and has had chronic kidney disease with concerns of a creatinine at 2.4. The patient now has a creatinine of 1.8, improving. He has diabetes, hypertension, hyperlipidemia, and now has bradycardia which is likely secondary to decreased sotalol metabolism from chronic kidney disease and acute renal failure. The patient has had some shortness of breath with weight gain, weakness, and fatigue consistent with acute on chronic diastolic dysfunction congestive heart failure with previous history of normal LV function by echocardiogram. The patient has had edema and shortness of breath with hypoxia with a normal troponin. EKG currently shows normal sinus rhythm with nonspecific ST changes.  REVIEW OF SYSTEMS: The remainder of review of systems is negative for vision change, ringing in the ears, hearing loss, cough, heartburn, nausea, vomiting, diarrhea, bloody stools, stomach pain, extremity pain, leg weakness, cramping in the buttocks, known blood clots, headaches, blackouts, dizzy spells, nosebleeds, weakness, skin lesions or skin rashes.   PAST MEDICAL HISTORY: Atrial fibrillation, hypertension, diabetes, hyperlipidemia, valvular heart disease.   FAMILY HISTORY: No family members with early onset of cardiovascular disease or hypertension.   SOCIAL HISTORY:  Currently no alcohol or tobacco use.   ALLERGIES: As listed.   MEDICATIONS: As listed.  PHYSICAL EXAMINATION: VITAL SIGNS: Blood pressure is 136/68 bilaterally and heart rate is a 58 upright, reclining, and regular.  GENERAL: He is a well appearing male in no acute distress.  HEENT: No icterus, thyromegaly, ulcers, hemorrhage, or xanthelasma.  CARDIOVASCULAR: Regular rate and rhythm. Normal S1 and S2. Diffuse PMI. Carotid upstroke normal without bruit. Jugular venous pressure is normal.  LUNGS: Have bibasilar crackles with decreased breath sounds.  ABDOMEN: Soft and nontender. Cannot assess hepatosplenomegaly or masses due to increased abdominal girth.  EXTREMITIES: 2+ radial, femoral, and dorsal pulses with trace to 1+ lower extremity edema. No cyanosis, clubbing, or ulcers.  NEUROLOGIC: The patient is oriented to time, place, and person with normal mood and affect.   ASSESSMENT: A 81 year old male with acute diastolic dysfunction congestive heart failure likely secondary to atrial fibrillation, bradycardia, acute on chronic kidney disease, diabetes, and hypertension.   RECOMMENDATIONS: 1.  Discontinuation of sotalol and Pradaxa due to acute renal failure and concerns of glomerular filtration rate abnormalities with metabolism of these medications through the kidneys. 2.  Continue watching for bradycardia improvements.  3.  Possible gentle diuresis if able for further lower extremity edema, pulmonary edema.  4.  No further intervention of congestive heart failure at this time with no cardiac catheterization due to acute renal failure.  5.  Other considerations of calcium channel blocker for hypertension control after above.  6.  Further diagnostic testing and treatment options when the patient has improvements of above. ____________________________ Corey Skains, MD bjk:sb D: 05/28/2013 09:24:31 ET T: 05/28/2013 09:41:40 ET JOB#: [id number]  cc: Corey Skains, MD,  <Dictator> Corey Skains MD ELECTRONICALLY SIGNED 05/30/2013 8:01

## 2014-05-14 NOTE — Consult Note (Signed)
   Present Illness Patient is a 81 year old African American male with history of sleep apnea, hyperlipidemia, atrial fibrillation, hypertension who presents to the emergency room for evaluation of episodic chest pain described is midsternal chest heaviness with radiation to his arms and back.  He has some shortness of breath associated with this.  He has a history of chronic atrial fibrillation electrocardiogram today suggest the atrial fibrillation with controlled ventricular response with no ischemia.  He is anticoagulated  with dabigatran and and tolerating this fairly well.  His atrial fibrillation rate is controlled.  He has improved since admission.  His initial serum troponins were negative.  He is controlled with diltiazem, sotalol.  His diabetes is being treated with glimepiride.  He is improved with chest x-ray showing no significant pulmonary edema and no ischemia on electrocardiogram.  He reports compliance with his medications.  He has not had a recent functional study.   Physical Exam:  GEN well nourished, no acute distress   HEENT PERRL, hearing intact to voice, moist oral mucosa   NECK supple   RESP normal resp effort  clear BS   CARD Irregular rate and rhythm  Murmur   Murmur Systolic   Systolic Murmur axilla   ABD denies tenderness  normal BS   LYMPH negative neck, negative axillae   EXTR negative cyanosis/clubbing, negative edema   SKIN normal to palpation   NEURO cranial nerves intact, motor/sensory function intact   PSYCH A+O to time, place, person   Review of Systems:  Subjective/Chief Complaint Chest pain and shortness of breath   General: No Complaints   Skin: No Complaints   ENT: No Complaints   Eyes: No Complaints   Neck: No Complaints   Respiratory: Short of breath   Cardiovascular: Chest pain or discomfort   Gastrointestinal: No Complaints   Genitourinary: No Complaints   Vascular: No Complaints   Musculoskeletal: No Complaints    Neurologic: No Complaints   Hematologic: No Complaints   Endocrine: No Complaints   Psychiatric: No Complaints   Review of Systems: All other systems were reviewed and found to be negative   Medications/Allergies Reviewed Medications/Allergies reviewed   Family & Social History:  Family and Social History:  Family History Non-Contributory   EKG:  Interpretation atrial fibrillation with no ischemia    No Known Allergies:    Impression 81 year old Afro-American male with history of chronic atrial fibrillation hypertension sleep apnea and hyperlipidemia who presents for evaluation of chest pain.  He has ruled out for myocardial infarction thus far.  He has on dabigatran for her his anticoagulation rate is controlled with diltiazem.  He is currently hemodynamically stable.  Etiology of his pain is unclear.  Echocardiogram is pending.  Will proceed with a functional study in the morning unless he rules an for myocardia infarction or becomes unstable.   Plan 1. Continue with current meds including dabigatran and  diltiazem. 2. Will left myocardia infarction 3. Echocardiogram to evaluate LV function 4. Functional study to evaluate for ischemia 5. Will make further recommendations after studies are completed   Electronic Signatures: Teodoro Spray (MD)  (Signed 22-Sep-15 13:45)  Authored: General Aspect/Present Illness, History and Physical Exam, Review of System, Family & Social History, EKG , Allergies, Impression/Plan   Last Updated: 22-Sep-15 13:45 by Teodoro Spray (MD)

## 2014-05-14 NOTE — H&P (Signed)
PATIENT NAME:  [name], [name] MR#:  [id number] DATE OF BIRTH:  [birth year]  DATE OF ADMISSION:  05/27/2013  PRIMARY CARE PHYSICIAN:  Dr. Clayborn Bigness  PRIMARY CARDIOLOGIST: [facility]  CHIEF COMPLAINT: Dizziness, lightheadedness, and nausea for the past 3 days.   HISTORY OF PRESENT ILLNESS: This is a very pleasant, 81 year old male with a history of atrial fibrillation, on chronic anticoagulation, diabetes, hypertension, who presents with the above complaint. Over the past 3 days, the patient has been feeling dizzy, lightheaded, and nauseous. Has not felt himself. He reports this when he is standing as well as when he is lying down. He was noted to be orthostatic, here in the ER, his creatinine has increased from last  creatinine check. He was also noted to be bradycardic, heart rates in the low 50s.   REVIEW OF SYSTEMS:  CONSTITUTIONAL: No fever, no chills. Positive fatigue and weakness.  EYES:  No blurred or double vision, glaucoma or cataracts.   ENT: No ear pain, hearing loss, seasonal allergies, postnasal drip.  RESPIRATORY:  No cough, wheezing, hemoptysis, COPD.  CARDIOVASCULAR: No chest pain, orthopnea, edema, arrhythmia, dyspnea on exertion, palpitations, syncope.  GASTROINTESTINAL: Positive nausea. No vomiting, diarrhea, abdominal pain, melena, or ulcers.  GENITOURINARY:  No dysuria or hematuria. No decreased urine output.  ENDOCRINE: No polyuria or polydipsia.  HEMATOLOGIC/LYMPHATIC: No easy bruising or bleeding.  SKIN: No rash or lesions.  MUSCULOSKELETAL: No pain in the shoulders or knees. No gout.  NEUROLOGIC: No history of CVA, TIA, or seizures.  PSYCHIATRIC: No history of anxiety or depression.   PAST MEDICAL HISTORY:  1.  Atrial fibrillation.    2.  Hypertension. 3.  Diabetes.  SURGICAL HISTORY:  1.  Prostatectomy. 2.  Appendectomy.   ALLERGIES: No known drug allergies.   MEDICATIONS:  1.  Vitamin D3, 2000 international units daily.  2.  Vitamin B12, 1000 mcg  daily.  3.  Sotalol 80 mg b.i.d.  4.  Pradaxa 150 b.i.d.  5.  Glimepiride 1 mg daily.  6.  Cartia 120 mg daily.   FAMILY HISTORY: No history of diabetes, hypertension.   PHYSICAL EXAMINATION: VITAL SIGNS: Temperature 97.3. Pulse is 57, respirations 18, blood pressure 92/51, 95% on room air. Orthostatics were checked, and he was positive for orthostatics, blood pressure 121/72 lying, 107/68 sitting, 130/70 standing.  GENERAL: The patient is alert, oriented, not in acute distress.  HEENT: Head is atraumatic. Pupils are round and reactive. Sclerae are anicteric. Mucous membranes are dry. Oropharynx is clear.  NECK: Supple, without JVD, carotid bruit, or enlarged thyroid.  CARDIOVASCULAR: Bradycardia, without any murmur, gallops, or rubs. Hard to appreciate PMI due to body habitus.  LUNGS: Clear to auscultation without crackles, rales, rhonchi, or wheezing. Normal percussion. No egophony, and normal chest expansion.  BACK: No CVA or vertebral tenderness.  ABDOMEN: Obese. Bowel sounds are positive. Hard to appreciate organomegaly due to body habitus. No rebound or guarding.  EXTREMITIES: No clubbing, cyanosis or edema.  NEUROLOGIC: Cranial nerves II through XII are intact. There are no focal deficits.  SKIN: Without any rashes or lesions.   LABORATORIES: Sodium 138, potassium 4.6, chloride 106, bicarb 26, BUN 32, creatinine 2.46, glucose 183. Troponin less than 0.02. White blood cells 6.1, hemoglobin 15.2, hematocrit 48, platelets are 181. Creatinine on 04/18/2011 of 1.34, on 04/18/2013 of 2.05.   EKG: Sinus bradycardia, with a heart rate of 57, in sinus arrhythmia.   CT of the head shows no acute intracranial hemorrhage or CVA.  ASSESSMENT AND PLAN: This is a 81 year old male who presented with dizziness, lightheadedness, noted to have some bradycardia and acute renal failure.   1.  Acute renal failure. We have a creatinine in 2013 of 1.34. Earlier, about 2 weeks ago in April, his  creatinine was 2.05. In any case, it appears that he has some kind of acute renal failure or acute kidney injury, with associated orthostatic hypotension, possibly from dehydration or poor perfusion from bradycardia. I have provided IV fluids, and will repeat a creatinine tomorrow. If it has not improved, then we can do further workup, including a renal ultrasound. Will monitor Is and Os as well. Hold any nephrotoxic agents at this time. It does not appear that he is on any nephrotoxic agents at home.   2.  Bradycardia. Seems symptomatic with his dizziness and lightheadedness, or these symptoms could be from his acute renal failure and dehydration. In any case, he is bradycardic. He is on sotalol and Cartia. I am holding these medications for now. He is also noted to be hypotensive, so, again, will hold Cartia and sotalol. Will have Cardiology consultation regarding his bradycardia. He probably needs to either stop these medications or at least decrease the doses. I will not order any other cardiac workup until Cardiology has seen the patient in consultation.   3.  Orthostatic hypotension. Possibly from dehydration and acute renal failure. Will provide IV fluids and check orthostatics in the morning.   4.  Hypertension. The patient is now hypotensive, so will hold his Cartia for now and monitor blood pressure.   5.  History of diabetes. Will continue his outpatient medications, including glimepiride, place the patient on ADA diet and sliding scale insulin.   The patient is a FULL CODE STATUS.    TIME SPENT: Approximately 50 minutes.      ____________________________ Donell Beers. Benjie Karvonen, MD spm:mr D: 05/27/2013 16:55:45 ET T: 05/27/2013 19:52:20 ET JOB#: [id number]  cc: Katelinn Justice P. Benjie Karvonen, MD, <Dictator> Lavera Guise, MD  Donell Beers Tyus Kallam MD ELECTRONICALLY SIGNED 05/27/2013 20:48

## 2014-05-14 NOTE — H&P (Signed)
PATIENT NAME:  [name], [name] MR#:  [id number] DATE OF BIRTH:  07-19-1933  DATE OF ADMISSION:  09/27/2013  PRIMARY CARE PHYSICIAN: Lavera Guise, MD  REFERRING PHYSICIAN: Conni Slipper, MD   CHIEF COMPLAINT: Cough, shortness of breath, generalized weakness.   HISTORY OF PRESENT ILLNESS: [name] is a 81 year old obese African American male with a history of atrial fibrillation, hypertension, diabetes mellitus. He ate his supper, took a nap in front of the TV, woke up, started to experience chills, generalized body aches. Experienced some high-grade fever; however, did not check the temperature. He has been having cough with mild productive sputum. Started to experience severe shortness of breath. Concerning this, came to the Emergency Department. The patient initially had low oxygen saturations in high 80s. The patient was placed on 2 liters of oxygen with improvement. The patient is spiking a fever of 99.8. Otherwise, lab workup unremarkable. Chest x-ray did not show any obvious infiltrates other than atelectasis of the lung bases. The patient is started on 2 liters of oxygen.   PAST MEDICAL HISTORY:  1.  Atrial fibrillation.  2.  Chronic kidney disease.  3.  Hypertension.  4.  Diabetes mellitus.   ALLERGIES: No known drug allergies.   HOME MEDICATIONS:  1.  Vitamin D3 at 2000 units once a day.  2.  Vitamin B12 at 2000 units once a day.  3.  Sotalol 160 mg 2 times a day.  4.  Glimepiride 1 mg once a day.  5.  Nexium 20 mg 2 times a day.  6.  Dabigatran 150 mg 2 times a day.  7.  Cardizem 120 mg once a day.  8.  Carafate 1 gram 3 times a day.   SOCIAL HISTORY: Smoked heavily in the past. Denies drinking alcohol or using illicit drugs. He still works; however, has poor mobility.   FAMILY HISTORY: History of diabetes mellitus, hypertension.   REVIEW OF SYSTEMS:  CONSTITUTIONAL: Experiencing generalized weakness.  EYES: No change in vision.  ENT: No change in hearing.   RESPIRATORY: Has cough, shortness of breath.  CARDIOVASCULAR: No chest pain or palpitations.  GASTROINTESTINAL: No nausea, vomiting, abdominal pain.  GENITOURINARY: No dysuria or hematuria.  HEMATOLOGIC: No easy bruising or bleeding.  SKIN: No rash or lesions.  MUSCULOSKELETAL: Has been experiencing generalized body aches. NEUROLOGICAL: No weakness or numbness in any part of the body.   PHYSICAL EXAMINATION: GENERAL: This is a well-built, well-nourished, obese male, lying down in the bed, not in distress.  VITAL SIGNS: Temperature 99.8, pulse 94, blood pressure 129/76, respiratory rate of 24, oxygen saturation 95% on 2 liters of oxygen.  HEENT: Head normocephalic, atraumatic. There is no scleral icterus. Conjunctivae normal. Pupils equal and react to light. Mucous membranes moist. No pharyngeal erythema.  NECK: Supple. No lymphadenopathy. No JVD. No carotid bruit.  CHEST: Has no focal tenderness. Crackles are heard in the left lower lobe.  HEART: S1, S2 regular. No murmurs are heard.  ABDOMEN: Bowel sounds present. Soft, nontender, nondistended. No hepatosplenomegaly.  EXTREMITIES: No pedal edema. Pulses 2+.  NEUROLOGIC: The patient is alert, oriented to place, person and time. Cranial nerves II through XII intact. Motor 5/5 in upper and lower extremities.   LABORATORY DATA: BMP: BUN 21, creatinine of 1.92. The rest of the values are within normal limits. BNP 269.   Chest x-ray, one view portable: Atelectasis in the lung bases.    D-dimer 1063. Troponin less than 0.02. CBC: WBC of 6.9, hemoglobin 14, platelet count  of 170,000.   ASSESSMENT AND PLAN: [name] is a 81 year old male who comes with sudden onset of the chills, generalized weakness, has been having cough.  1.  Pneumonia: Even though chest x-ray does not show any obvious infiltrates, crackles are heard on the left lower lobe. We will treat it as a community-acquired pneumonia with Rocephin and Zithromax. If the patient's  symptoms do not improve, may consider getting a CT of the chest. The patient did not have any lower extremity swelling. Patient lives a sedentary lifestyle.  2.  Hypoxic respiratory failure, most likely secondary to developing pneumonia. Continue with antibiotics and follow up.  3.  Chronic kidney disease, stable.  4.  History of atrial fibrillation. Rate is controlled on Cardizem.  5.  Debility, involve with physical therapy.  6.  Keep the patient on deep vein thrombosis prophylaxis with Lovenox.   TIME SPENT: 50 minutes.    ____________________________ Monica Becton, MD pv:ts D: 09/27/2013 01:19:04 ET T: 09/27/2013 01:59:58 ET JOB#: [id number]  cc: Monica Becton, MD, <Dictator> Monica Becton MD ELECTRONICALLY SIGNED 10/06/2013 21:31

## 2014-05-15 NOTE — Discharge Summary (Signed)
PATIENT NAME:  [name], [name] MR#:  [id number] DATE OF BIRTH:  Dec 23, 1933  DATE OF ADMISSION:  04/17/2011 DATE OF DISCHARGE:  04/19/2011  DIAGNOSES:  1. Syncope, possibly related to atrial fibrillation with rapid ventricular response and orthostasis.  2. Atrial fibrillation with rapid ventricular response, new onset.  3. Chronic kidney disease, stage III.  4. Diabetes. 5. Hypertension. 6. History of prostate cancer.   DISPOSITION: Patient is being discharged home.   FOLLOW UP: Follow up with Dr. Clayborn Bigness within 1 to 2 weeks after discharge. Follow up with primary care physician, Dr. Clayborn Bigness, within 1 to 2 weeks after discharge.   DIET: Low sodium, 1800 calorie ADA diet.   ACTIVITY: As tolerated.   DISCHARGE MEDICATIONS:  1. Pradaxa 150 mg b.i.d. 2. Sotalol 80 mg b.i.d.  3. Cardizem 120 mg daily.  4. Glimepiride 1 mg daily.  5. Vitamin B12 2500 mcg daily.  6. Vitamin D3 2000 international units daily.  7. Omega-3 fish oil 1200 mg once a day.   CONSULTATION: Cardiology consultation with Dr. Clayborn Bigness.   LABORATORY, DIAGNOSTIC AND RADIOLOGICAL DATA: Echocardiogram showed normal ejection fraction of more than 55%, normal left ventricular wall thickness, normal right ventricular systolic function. Carotid ultrasound showed no hemodynamically significant stenosis. CAT scan of the head showed no abnormalities. Chest x-ray showed no abnormalities. CBC was normal. Complete metabolic panel showed chronic kidney disease, creatinine ranging from 1.72 to 1.34. Normal LFTs. Normal cardiac enzymes. Normal TSH.   HOSPITAL COURSE: Patient is a 81 year old male with past medical history of diabetes, hypertension, chronic kidney disease, sleep apnea, prostate cancer who presented with a syncopal episode. Patient was found to be in atrial fibrillation with RVR. He was given some Cardizem by EMS and his heart rate has been controlled throughout. He did not require any Cardizem drip. A TSH was  checked and was normal. Patient was evaluated by Dr. Clayborn Bigness during the hospitalization and has been started on sotalol and Pradaxa for anticoagulation. Patient continued to feel dizzy in spite of his heart rate being controlled therefore orthostatics were checked. Patient had orthostatic hypotension. He was hydrated with IV fluids. Extensive work-up including CAT scan of the head, echocardiogram and carotid ultrasounds were negative. Patient was advised to avoid sudden changes in position and to get up slowly and stay hydrated. The rest of his medical problems including hypertension, diabetes remained stable. Patient is being discharged home in a stable condition.   TIME SPENT: 45 minutes.   ____________________________ Cherre Huger, MD sp:cms D: 04/19/2011 15:42:58 ET T: 04/22/2011 08:40:27 ET JOB#: [id number]  cc: Cherre Huger, MD, <Dictator> Lavera Guise, MD Cherre Huger MD ELECTRONICALLY SIGNED 04/27/2011 12:21

## 2014-05-15 NOTE — H&P (Signed)
PATIENT NAME:  [name] MR#:  [id number] DATE OF BIRTH:  [birth year]  DATE OF ADMISSION:  04/17/2011  PRIMARY CARE PHYSICIAN: Dr. Clayborn Bigness   CHIEF COMPLAINT: Passed out.   HISTORY OF PRESENT ILLNESS: A 81 year old male who has history of diabetes, hypertension, previous history of prostate cancer status post prostate surgery. He was last admitted in July 2012 with systemic inflammatory response syndrome, pneumonia and atrial fibrillation. Today he presented to the Emergency Room brought in by EMS. He said this morning while he was going to the bathroom he fell, possibly he had a syncopal episode. He had sudden onset of nausea at that time. He denied any palpitations or chest pain or dizziness at that time. When EMTs came he was in atrial fibrillation with a heart rate of around 130 to 150 and they gave him around 25 mg of IV Cardizem and his heart rate improved to 70 to 90. When he presented to the Emergency Room he was still in atrial fibrillation but his heart rate was well controlled in the range of 80s. He denies any palpitations, any chest pain, any shortness of breath, any dizziness. He denies any headache. He says he has some shortness of breath on exertion. He is also complaining of some lower extremity edema but denies any worsening. Hospitalist was asked to admit the patient because of syncope and new onset atrial fibrillation but he has documented atrial fibrillation in July 2012 also. He got a dose of Lovenox injection in the Emergency Room because of his new onset atrial fibrillation.   REVIEW OF SYSTEMS: He denies any fever or weakness. No acute change in vision. No headache. No dizziness. No cough. No dyspnea. No chest pain. No palpitations. He has some dyspnea on exertion. He has some leg edema also, was nauseous today. He had syncope today. No abdominal pain. No GI bleed. No dysuria. No frequency. No thyroid problems. No anemia. No rash. No joint pains or swelling. No focal  numbness or weakness. No anxiety, depression.   PAST MEDICAL HISTORY:  1. Diabetes. 2. Hypertension. 3. He was last admitted in 07/2010 because of systemic inflammatory response syndrome secondary to pneumonia.  4. He also appears to be chronic kidney disease stage III with baseline creatinine running in the range of 1.7 to 1.6. 5. He has history of prostate cancer in the past.  6. He also has history of hepatic cyst.  7. Obstructive sleep apnea, uses CPAP sometimes he says.   PAST SURGICAL HISTORY: Prostate surgery for his prostate cancer history.   ALLERGIES TO MEDICATIONS: None.   HOME MEDICATIONS:  1. Amlodipine 5 mg twice a day.  2. Aspirin 81 mg daily.  3. Carvedilol 3.125 mg b.i.d.  4. Glimepiride 1 mg once a day.  5. Omega-3 fatty acid once a day.  6. Vitamin B12 2500 mcg daily.  7. Vitamin D3 2000 units daily. 8. Vitamin E 200 units daily.    SOCIAL HISTORY: He lives with his wife. No smoking or alcohol use.   FAMILY HISTORY: No significant medical problems in the family.   PHYSICAL EXAMINATION:  VITAL SIGNS: As per the EMTs: Heart rate around 130 to 150. When he came to the Emergency Room temperature 96.7 heart rate 86, respirations 16, blood pressure 106/66, saturating 95% on room air. Currently he is still in atrial fibrillation with heart rate jumping between 90 to 110.   GENERAL: This is an elderly African American male, obese. He is comfortably lying in bed,  no acute distress.   HEENT: Bilateral pupils are equal. Extraocular muscles intact. No scleral icterus. No conjunctivitis. Oral mucosa is moist. No pallor.   NECK: No thyroid tenderness, enlargement or nodule. Neck is supple. No masses, nontender. No adenopathy. No JVD. No carotid bruit.   CHEST: Bilateral breath sounds are clear. Maybe minimal crackles in the bases. Normal respiratory effort. Not using accessory muscles of respiration.   HEART: Heart sounds are irregularly regular. No murmur. Peripheral  pulses 1+. He has some lower extremity edema.   ABDOMEN: Soft, nontender. Normal bowel sounds. No hepatomegaly. No bruit. No masses.   RECTAL: Deferred.   NEUROLOGIC: He is awake, alert, oriented to time, place, and person. Cranial nerves are intact. Moving all extremities against gravity.   EXTREMITIES: No cyanosis. No clubbing.   SKIN: No rash. No lesions.   LABORATORY, DIAGNOSTIC, AND RADIOLOGICAL DATA: White count 8.3, hemoglobin 14.2, platelet count 168,000. BMP: Sodium 142, potassium 4, BUN 22, creatinine 1.72, GFR of around 50. CK 168, troponin negative. INR 0.9. Urinalysis negative. He had a CT head done which is negative. Chest x-ray negative. His EKG when he came into the Emergency Room showed atrial fibrillation at rate of 68, left axis deviation, nonspecific T wave changes.   IMPRESSION:  1. Syncope. 2. Atrial fibrillation with rapid ventricular response. 3. Chronic kidney disease, stage III.  4. Diabetes.  5. Hypertension.  6. History of prostate cancer.   PLAN: A 81 year old male African American with history of diabetes, hypertension and prostate cancer presents with a syncopal episode, was found to be in rapid atrial fibrillation. He got 25 mg of IV Cardizem by EMS. His heart rate improved with that. He has history of atrial fibrillation in the past also when he was admitted in July 2012. His last echo in July 2012 showed ejection fraction of greater than 55%. He is currently on Coreg 3.125 mg b.i.d. I am going to go up on Coreg to 6.25 mg b.i.d. If his heart rate is still not controlled then we can add Cardizem to his regimen also. Will decrease his amlodipine to 5 mg daily. He is on aspirin, continue that. His CHADS2 score is 3. I am gong to start him on Lovenox right now dose to his creatinine clearance. He may need Pradaxa or Coumadin. Will get a cardiology consultation on him. Will also check a TSH on him. Will get an echocardiogram.   TIME SPENT WITH ADMISSION AND  COORDINATION: 55 minutes.   ____________________________ Mena Pauls, MD ag:cms D: 04/17/2011 11:57:14 ET T: 04/17/2011 12:14:48 ET JOB#: [id number]  cc: Mena Pauls, MD, <Dictator> Lavera Guise, MD Mena Pauls MD ELECTRONICALLY SIGNED 05/07/2011 15:33

## 2014-05-15 NOTE — Consult Note (Signed)
PATIENT NAME:  [name], [name] MR#:  [id number] DATE OF BIRTH:  Sep 16, 1933  DATE OF CONSULTATION:  04/17/2011  REFERRING PHYSICIAN:  Dr. Lyndel Safe  CONSULTING PHYSICIAN:  Edwyn Inclan D. Clayborn Bigness, MD  PRIMARY CARE PHYSICIAN: Dr. Clayborn Bigness   INDICATION: Syncope and atrial fibrillation.   HISTORY OF PRESENT ILLNESS: Patient is a 81 year old African American male with diabetes, hypertension, prostate cancer status post surgery seen in July with history of systemic inflammatory response syndrome, pneumonia, atrial fibrillation presented to the Emergency Room brought in by EMS with a syncopal episode at home. The wife said he passed out in the bathroom. He reportedly was doing okay. Got up, felt dizzy, felt nauseous and then passed out and finally was brought to the Emergency Room. He was found to be in atrial fibrillation, treated with Cardizem. He had some shortness of breath, lower extremity edema. He was treated with Lovenox and then admitted for further evaluation and care.   REVIEW OF SYSTEMS: He has had blackout spells and syncope. He has had some nausea and vomiting. No fever. No chills. No sweats. No weight loss. No weight gain. No hemoptysis, hematemesis. No bright red blood per rectum. No vision change. No hearing change. No sputum production or cough.   PAST MEDICAL HISTORY:  1. Diabetes.  2. Hypertension. 3. Systemic inflammatory response syndrome. 4. Pneumonia. 5. Chronic renal insufficiency, baseline creatinine 1.7. 6. Prostate cancer. 7. Hepatic cyst. 8. Obstructive sleep apnea. 9. Obesity.   PAST SURGICAL HISTORY: Prostate surgery.   ALLERGIES: None.   MEDICATIONS:  1. Amlodipine 5 mg twice a day.  2. Aspirin 81 mg a day. 3. Carvedilol 3.125 twice a day.  4. Glimepiride 1 mg a day.  5. Omega-3 fatty acid once a day.  6. Vitamin B12, 2500 a day.  7. Vitamin D3, 2000 daily.  8. Vitamin E 200 a day.   FAMILY HISTORY: Negative.   SOCIAL HISTORY: Married. Denies smoking,  alcohol consumption.   PHYSICAL EXAMINATION:  VITAL SIGNS: Blood pressure when he came in was 100/60, heart rate was 140 and irregular, respiratory rate 16, afebrile.   HEENT: Normocephalic, atraumatic. Pupils equal, reactive to light.   NECK: Supple. No significant jugular venous distention, bruits, adenopathy.   LUNGS: Clear to A/P No R/R.   HEART: Irregular, irregular. Systolic ejection murmur left sternal border.   LUNGS: Bilateral rhonchi. No significant rales. Adequate air movement.   ABDOMEN: Benign.   EXTREMITIES: Within normal limits.   NEUROLOGIC: Grossly intact.   SKIN: Normal.   LABORATORY, DIAGNOSTIC AND RADIOLOGICAL DATA: White count 8.3, hemoglobin 14, platelet count 168. BMP shows sodium 142, potassium 4, BUN 22, creatinine 1.72. GFR 50. CK 168. Troponin was negative. PT-INR was normal. CT of the head negative. Chest x-ray negative. EKG: Atrial fibrillation with improved rate from 140 down to 70, left axis deviation, nonspecific ST-T wave changes.   ASSESSMENT:  1. Syncope.  2. Atrial fibrillation.  3. Renal insufficiency.  4. Hypertension.  5. Diabetes. 6. Obesity. 7. Obstructive sleep apnea   PLAN: Agree with admit. Rule out for myocardial infarction. Continue current medications. Consider echocardiogram. Anticoagulation for now. Treat atrial fibrillation with rate control. Consider antiarrhythmic control. Consider long-term anticoagulation. Continue beta blockers and/or calcium blocker. Lovenox now may be switched to Pradaxa or Coumadin. Recommend weight loss and exercise. Continue diabetes, continue blood pressure control. Base further evaluation on how he does.  ____________________________ Loran Senters Clayborn Bigness, MD ddc:cms D: 04/18/2011 10:47:00 ET T: 04/18/2011 11:11:44 ET JOB#: [id number]  cc:  Peggie Hornak D. Clayborn Bigness, MD, <Dictator> Yolonda Kida MD ELECTRONICALLY SIGNED 05/17/2011 15:49

## 2014-05-16 DIAGNOSIS — Z125 Encounter for screening for malignant neoplasm of prostate: Secondary | ICD-10-CM | POA: Diagnosis not present

## 2014-05-16 DIAGNOSIS — Z0001 Encounter for general adult medical examination with abnormal findings: Secondary | ICD-10-CM | POA: Diagnosis not present

## 2014-05-16 DIAGNOSIS — K219 Gastro-esophageal reflux disease without esophagitis: Secondary | ICD-10-CM | POA: Diagnosis not present

## 2014-05-16 DIAGNOSIS — I10 Essential (primary) hypertension: Secondary | ICD-10-CM | POA: Diagnosis not present

## 2014-05-16 DIAGNOSIS — E1165 Type 2 diabetes mellitus with hyperglycemia: Secondary | ICD-10-CM | POA: Diagnosis not present

## 2014-05-16 DIAGNOSIS — Z1211 Encounter for screening for malignant neoplasm of colon: Secondary | ICD-10-CM | POA: Diagnosis not present

## 2014-05-17 ENCOUNTER — Ambulatory Visit (INDEPENDENT_AMBULATORY_CARE_PROVIDER_SITE_OTHER): Payer: Medicare Other | Admitting: Podiatry

## 2014-05-17 DIAGNOSIS — M79676 Pain in unspecified toe(s): Secondary | ICD-10-CM

## 2014-05-17 DIAGNOSIS — B351 Tinea unguium: Secondary | ICD-10-CM

## 2014-05-17 NOTE — Progress Notes (Signed)
Subjective: 81 y.o.-year-old male returns the office today for painful, elongated, thickened toenails which he is unable to trim himself. Denies any redness or drainage around the nails. Denies any acute changes since last appointment and no new complaints today. Denies any systemic complaints such as fevers, chills, nausea, vomiting.   Objective: AAO 3, NAD DP/PT pulses palpable, CRT less than 3 seconds Nails hypertrophic, dystrophic, elongated, brittle, discolored 10. There is tenderness overlying the nails 1-5 bilaterally. There is no surrounding erythema or drainage along the nail sites. No open lesions or pre-ulcerative lesions are identified. No other areas of tenderness bilateral lower extremities. No overlying edema, erythema, increased warmth. No pain with calf compression, swelling, warmth, erythema.  Assessment: Patient presents with symptomatic onychomycosis  Plan: -Treatment options including alternatives, risks, complications were discussed -Nails sharply debrided 10 without complication/bleeding. -Discussed daily foot inspection. If there are any changes, to call the office immediately.  -Follow-up in 3 months or sooner if any problems are to arise. In the meantime, encouraged to call the office with any questions, concerns, changes symptoms.

## 2014-05-20 DIAGNOSIS — H6123 Impacted cerumen, bilateral: Secondary | ICD-10-CM | POA: Diagnosis not present

## 2014-05-20 DIAGNOSIS — J301 Allergic rhinitis due to pollen: Secondary | ICD-10-CM | POA: Diagnosis not present

## 2014-05-21 DIAGNOSIS — G4733 Obstructive sleep apnea (adult) (pediatric): Secondary | ICD-10-CM | POA: Diagnosis not present

## 2014-05-26 DIAGNOSIS — E11329 Type 2 diabetes mellitus with mild nonproliferative diabetic retinopathy without macular edema: Secondary | ICD-10-CM | POA: Diagnosis not present

## 2014-06-20 DIAGNOSIS — G4733 Obstructive sleep apnea (adult) (pediatric): Secondary | ICD-10-CM | POA: Diagnosis not present

## 2014-07-06 DIAGNOSIS — I10 Essential (primary) hypertension: Secondary | ICD-10-CM | POA: Insufficient documentation

## 2014-07-21 DIAGNOSIS — G4733 Obstructive sleep apnea (adult) (pediatric): Secondary | ICD-10-CM | POA: Diagnosis not present

## 2014-08-05 DIAGNOSIS — I480 Paroxysmal atrial fibrillation: Secondary | ICD-10-CM | POA: Diagnosis not present

## 2014-08-05 DIAGNOSIS — G4733 Obstructive sleep apnea (adult) (pediatric): Secondary | ICD-10-CM | POA: Diagnosis not present

## 2014-08-05 DIAGNOSIS — I10 Essential (primary) hypertension: Secondary | ICD-10-CM | POA: Diagnosis not present

## 2014-08-12 DIAGNOSIS — R079 Chest pain, unspecified: Secondary | ICD-10-CM | POA: Diagnosis not present

## 2014-08-14 DIAGNOSIS — B372 Candidiasis of skin and nail: Secondary | ICD-10-CM | POA: Diagnosis not present

## 2014-08-14 DIAGNOSIS — L03112 Cellulitis of left axilla: Secondary | ICD-10-CM | POA: Diagnosis not present

## 2014-08-16 ENCOUNTER — Ambulatory Visit: Payer: Self-pay | Admitting: Podiatry

## 2014-08-18 DIAGNOSIS — Z1211 Encounter for screening for malignant neoplasm of colon: Secondary | ICD-10-CM | POA: Diagnosis not present

## 2014-08-18 DIAGNOSIS — E782 Mixed hyperlipidemia: Secondary | ICD-10-CM | POA: Diagnosis not present

## 2014-08-18 DIAGNOSIS — Z125 Encounter for screening for malignant neoplasm of prostate: Secondary | ICD-10-CM | POA: Diagnosis not present

## 2014-08-18 DIAGNOSIS — Z0001 Encounter for general adult medical examination with abnormal findings: Secondary | ICD-10-CM | POA: Diagnosis not present

## 2014-08-20 DIAGNOSIS — G4733 Obstructive sleep apnea (adult) (pediatric): Secondary | ICD-10-CM | POA: Diagnosis not present

## 2014-08-23 DIAGNOSIS — N200 Calculus of kidney: Secondary | ICD-10-CM | POA: Diagnosis not present

## 2014-08-23 DIAGNOSIS — I10 Essential (primary) hypertension: Secondary | ICD-10-CM | POA: Diagnosis not present

## 2014-08-23 DIAGNOSIS — I4891 Unspecified atrial fibrillation: Secondary | ICD-10-CM | POA: Diagnosis not present

## 2014-08-23 DIAGNOSIS — J449 Chronic obstructive pulmonary disease, unspecified: Secondary | ICD-10-CM | POA: Diagnosis not present

## 2014-08-23 DIAGNOSIS — R944 Abnormal results of kidney function studies: Secondary | ICD-10-CM | POA: Diagnosis not present

## 2014-08-23 DIAGNOSIS — E119 Type 2 diabetes mellitus without complications: Secondary | ICD-10-CM | POA: Diagnosis not present

## 2014-09-12 DIAGNOSIS — M7989 Other specified soft tissue disorders: Secondary | ICD-10-CM | POA: Diagnosis not present

## 2014-09-12 DIAGNOSIS — M7731 Calcaneal spur, right foot: Secondary | ICD-10-CM | POA: Diagnosis not present

## 2014-09-12 DIAGNOSIS — M25571 Pain in right ankle and joints of right foot: Secondary | ICD-10-CM | POA: Diagnosis not present

## 2014-09-20 DIAGNOSIS — G4733 Obstructive sleep apnea (adult) (pediatric): Secondary | ICD-10-CM | POA: Diagnosis not present

## 2014-09-21 ENCOUNTER — Ambulatory Visit: Payer: Self-pay

## 2014-10-07 ENCOUNTER — Ambulatory Visit (INDEPENDENT_AMBULATORY_CARE_PROVIDER_SITE_OTHER): Payer: Medicare Other | Admitting: Urology

## 2014-10-07 ENCOUNTER — Encounter: Payer: Self-pay | Admitting: Urology

## 2014-10-07 VITALS — BP 134/83 | HR 83 | Ht 72.0 in | Wt 259.5 lb

## 2014-10-07 DIAGNOSIS — Z8546 Personal history of malignant neoplasm of prostate: Secondary | ICD-10-CM

## 2014-10-07 DIAGNOSIS — Z8549 Personal history of malignant neoplasm of other male genital organs: Secondary | ICD-10-CM

## 2014-10-07 DIAGNOSIS — N200 Calculus of kidney: Secondary | ICD-10-CM

## 2014-10-07 NOTE — Progress Notes (Signed)
10/07/2014 4:54 PM   [name] 09/09/1933 [id number]  Referring provider: Lavera Guise, MD [address] [place],  [address]  Chief Complaint  Patient presents with  . Nephrolithiasis    New Patient    HPI: The patient is a 81 year old male with a past medical history of nephrolithiasis, prostate cancer, and penile cancer who presents today for evaluation of his worsening renal function. The patient is asymptomatic at this time. He has passed stones before in the past which are very painful. He does not feel like that now. His creatinine is now risen to 2 and his primary care doctor's concern that maybe related to stones. Also note he had a prostatectomy in 1996. He had a PSA of 2.8 in July 2016. This is being followed by his primary care doctor.  It is not clear what his previous PSA was. 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 He had it removed at [facility] in 1997. He has no symptoms since that time. He is unclear of what specific type of penile cancer that he had.   PMH: Past Medical History  Diagnosis Date  . Hearing loss   . Diabetes   . High blood pressure   . History of bladder problems     Surgical History: Past Surgical History  Procedure Laterality Date  . Prostate cancer    . Prostate surgery      Home Medications:    Medication List       This list is accurate as of: 10/07/14  4:54 PM.  Always use your most recent med list.               CARTIA XT 120 MG 24 hr capsule  Generic drug:  diltiazem  TAKE 1 CAPSULE BY MOUTH EVERY MORNING     carvedilol 10 MG 24 hr capsule  Commonly known as:  COREG CR  Take 10 mg by mouth 2 (two) times daily.     cephALEXin 500 MG capsule  Commonly known as:  KEFLEX  TK 1 C PO BID     cetirizine 10 MG tablet  Commonly known as:  ZYRTEC  Take 10 mg by mouth daily.     glimepiride 1 MG tablet  Commonly known as:  AMARYL  Take by mouth.     ONETOUCH VERIO test strip    Generic drug:  glucose blood     polyethylene glycol powder powder  Commonly known as:  GLYCOLAX/MIRALAX  Use as directed per colonoscopy prep sheet     PRADAXA 150 MG Caps capsule  Generic drug:  dabigatran  TAKE 1 CAPSULE BY MOUTH TWICE DAILY     RA VITAMIN B-12 TR 1000 MCG Tbcr  Generic drug:  Cyanocobalamin  Take by mouth.     Vitamin D3 2000 UNITS capsule  Take by mouth.        Allergies: No Known Allergies  Family History: Family History  Problem Relation Age of Onset  . Cancer Mother   . Diabetes    . High blood pressure    . Prostate cancer Brother     Social History:  reports that he has quit smoking. His smoking use included Cigarettes. He quit after 16 years of use. He has never used smokeless tobacco. He reports that he does not drink alcohol or use illicit drugs.  ROS: UROLOGY Frequent Urination?: No Hard to postpone urination?: No Burning/pain with urination?: No  Get up at night to urinate?: No Leakage of urine?: Yes Urine stream starts and stops?: Yes Trouble starting stream?: No Do you have to strain to urinate?: No Blood in urine?: No Urinary tract infection?: No Sexually transmitted disease?: No Injury to kidneys or bladder?: No Painful intercourse?: No Weak stream?: No Erection problems?: No Penile pain?: No  Gastrointestinal Nausea?: No Vomiting?: No Indigestion/heartburn?: Yes Diarrhea?: No Constipation?: No  Constitutional Fever: Yes Night sweats?: No Weight loss?: No Fatigue?: No  Skin Skin rash/lesions?: Yes Itching?: No  Eyes Blurred vision?: No Double vision?: No  Ears/Nose/Throat Sore throat?: No Sinus problems?: No  Hematologic/Lymphatic Swollen glands?: No Easy bruising?: Yes  Cardiovascular Leg swelling?: Yes Chest pain?: Yes  Respiratory Cough?: Yes Shortness of breath?: No  Endocrine Excessive thirst?: No  Musculoskeletal Back pain?: No Joint pain?: No  Neurological Headaches?:  Yes Dizziness?: Yes  Psychologic Depression?: No Anxiety?: No  Physical Exam: BP 134/83 mmHg  Pulse 83  Ht 6' (1.829 m)  Wt 259 lb 8 oz (117.708 kg)  BMI 35.19 kg/m2  Constitutional:  Alert and oriented, No acute distress. HEENT:  AT, moist mucus membranes.  Trachea midline, no masses. Cardiovascular: No clubbing, cyanosis, or edema. Respiratory: Normal respiratory effort, no increased work of breathing. GI: Abdomen is soft, nontender, nondistended, no abdominal masses GU: No CVA tenderness. Normal phallus. No signs of recurrent cancer. Testicles descended bilaterally. Nontender to palpation. No masses. Skin: No rashes, bruises or suspicious lesions. Lymph: No cervical or inguinal adenopathy. Neurologic: Grossly intact, no focal deficits, moving all 4 extremities. Psychiatric: Normal mood and affect.  Laboratory Data: Lab Results  Component Value Date   WBC 5.9 10/13/2013   HGB 13.4 10/13/2013   HCT 42.2 10/13/2013   MCV 92 10/13/2013   PLT 159 10/13/2013    Lab Results  Component Value Date   CREATININE 1.65* 10/13/2013    No results found for: PSA  No results found for: TESTOSTERONE  No results found for: HGBA1C  Urinalysis    Component Value Date/Time   COLORURINE Yellow 10/12/2013 1445   APPEARANCEUR Clear 10/12/2013 1445   LABSPEC 1.015 10/12/2013 1445   PHURINE 5.0 10/12/2013 1445   GLUCOSEU 50 mg/dL 10/12/2013 1445   HGBUR 1+ 10/12/2013 1445   BILIRUBINUR Negative 10/12/2013 1445   KETONESUR Negative 10/12/2013 1445   PROTEINUR Negative 10/12/2013 1445   NITRITE Negative 10/12/2013 1445   LEUKOCYTESUR Negative 10/12/2013 1445    Cr: 2.01  PSA: 2.8 (7/16)  Pertinent Imaging: The patient apparently has small bilateral calculi that were nonobstructing and a March 2016 CT scan. We were unable to review the images of that today as they were not available.  Assessment & Plan:    1.  Nephrolithiasis The patient had some small stones apparently  on CT scan in March. He is asymptomatic from this at this time however his renal function is declining. We will check a renal ultrasound to ensure that he does not have hydronephrosis causing the change in his renal function. If this is normal and there does not appear to be large stones, no further workup is needed.  2. History of prostate cancer The patient is being followed by his primary care doctor with PSAs. It is unclear if his PSA ever became undetectable after surgery. His current PSA is 2.8 after 20 years from his prostatectomy. We'll recommend that his primary care doctor continue to check PSAs regularly.  If.there is a significant bump in PSA or if the patient has  bone pain, he should be referred back to urology. More than likely this will never be an issue for the patient since it's been 20 years since his surgery. It still however should be followed.  3. History of penile cancer No evidence of disease.  Follow up: When necessary for hydronephrosis or further rise in his PSA.   Nickie Retort, MD  [facility] [address] [place], [place] [address] [phone]

## 2014-10-21 ENCOUNTER — Ambulatory Visit
Admission: RE | Admit: 2014-10-21 | Discharge: 2014-10-21 | Disposition: A | Discharge status: Home / Self Care | Payer: Medicare Other | Source: Ambulatory Visit | Attending: Urology | Admitting: Urology

## 2014-10-21 DIAGNOSIS — G4733 Obstructive sleep apnea (adult) (pediatric): Secondary | ICD-10-CM | POA: Diagnosis not present

## 2014-10-21 DIAGNOSIS — N200 Calculus of kidney: Secondary | ICD-10-CM

## 2014-10-27 ENCOUNTER — Encounter: Payer: Self-pay | Admitting: Urology

## 2014-10-27 ENCOUNTER — Ambulatory Visit (INDEPENDENT_AMBULATORY_CARE_PROVIDER_SITE_OTHER): Payer: Medicare Other | Admitting: Urology

## 2014-10-27 VITALS — BP 128/74 | HR 84 | Ht 72.0 in | Wt 264.7 lb

## 2014-10-27 DIAGNOSIS — N200 Calculus of kidney: Secondary | ICD-10-CM

## 2014-10-27 NOTE — Progress Notes (Signed)
10/27/2014 3:32 PM   [name] 05-Jul-1933 [id number]  Referring provider: Lavera Guise, MD [address] [place], [place] [address]  Chief Complaint  Patient presents with  . Results    HPI: The patient is a 81 year old male with a past medical history of nephrolithiasis, prostate cancer, and penile cancer who presents today for evaluation of his worsening renal function. The patient is asymptomatic at this time. He has passed stones before in the past which are very painful. He does not feel like that now. His creatinine is now risen to 2 and his primary care doctor's concern that maybe related to stones. Also note he had a prostatectomy in 1996. He had a PSA of 2.8 in July 2016. This is being followed by his primary care doctor. It is not clear what his previous PSA was. 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 He had it removed at [facility] in 1997. He has no symptoms since that time. He is unclear of what specific type of penile cancer that he had.  Interval History:  The patient presents to follow-up on his renal ultrasound findings.  PMH: Past Medical History  Diagnosis Date  . Hearing loss   . Diabetes   . High blood pressure   . History of bladder problems     Surgical History: Past Surgical History  Procedure Laterality Date  . Prostate cancer    . Prostate surgery      Home Medications:    Medication List       This list is accurate as of: 10/27/14  3:32 PM.  Always use your most recent med list.               CARTIA XT 120 MG 24 hr capsule  Generic drug:  diltiazem  TAKE 1 CAPSULE BY MOUTH EVERY MORNING     carvedilol 10 MG 24 hr capsule  Commonly known as:  COREG CR  Take 10 mg by mouth 2 (two) times daily.     cephALEXin 500 MG capsule  Commonly known as:  KEFLEX  TK 1 C PO BID     cetirizine 10 MG tablet  Commonly known as:  ZYRTEC  Take 10 mg by mouth daily.     glimepiride 1 MG tablet  Commonly  known as:  AMARYL  Take by mouth.     ONETOUCH VERIO test strip  Generic drug:  glucose blood     polyethylene glycol powder powder  Commonly known as:  GLYCOLAX/MIRALAX  Use as directed per colonoscopy prep sheet     PRADAXA 150 MG Caps capsule  Generic drug:  dabigatran  TAKE 1 CAPSULE BY MOUTH TWICE DAILY     RA VITAMIN B-12 TR 1000 MCG Tbcr  Generic drug:  Cyanocobalamin  Take by mouth.     Vitamin D3 2000 UNITS capsule  Take by mouth.        Allergies: No Known Allergies  Family History: Family History  Problem Relation Age of Onset  . Cancer Mother   . Diabetes    . High blood pressure    . Prostate cancer Brother     Social History:  reports that he has quit smoking. His smoking use included Cigarettes. He quit after 16 years of use. He has never used smokeless tobacco. He reports that he does not drink alcohol or use illicit drugs.  ROS:  Physical Exam: There were no vitals taken for this visit.  Constitutional:  Alert and oriented, No acute distress. HEENT: [place] AT, moist mucus membranes.  Trachea midline, no masses. Cardiovascular: No clubbing, cyanosis, or edema. Respiratory: Normal respiratory effort, no increased work of breathing. GI: Abdomen is soft, nontender, nondistended, no abdominal masses GU: No CVA tenderness.  Skin: No rashes, bruises or suspicious lesions. Lymph: No cervical or inguinal adenopathy. Neurologic: Grossly intact, no focal deficits, moving all 4 extremities. Psychiatric: Normal mood and affect.  Laboratory Data: Lab Results  Component Value Date   WBC 5.9 10/13/2013   HGB 13.4 10/13/2013   HCT 42.2 10/13/2013   MCV 92 10/13/2013   PLT 159 10/13/2013    Lab Results  Component Value Date   CREATININE 1.65* 10/13/2013    No results found for: PSA  No results found for: TESTOSTERONE  No results found for: HGBA1C  Urinalysis    Component Value  Date/Time   COLORURINE Yellow 10/12/2013 1445   APPEARANCEUR Clear 10/12/2013 1445   LABSPEC 1.015 10/12/2013 1445   PHURINE 5.0 10/12/2013 1445   GLUCOSEU 50 mg/dL 10/12/2013 1445   HGBUR 1+ 10/12/2013 1445   BILIRUBINUR Negative 10/12/2013 1445   KETONESUR Negative 10/12/2013 1445   PROTEINUR Negative 10/12/2013 1445   NITRITE Negative 10/12/2013 1445   LEUKOCYTESUR Negative 10/12/2013 1445    Pertinent Imaging: FINDINGS: Right Kidney:  Length: 9.9 cm. There are echogenic foci within the right kidney, largest measures 1.1 cm in the mid to lower pole. No mass or hydronephrosis visualized.  Left Kidney:  Length: 9.2 cm. There are echogenic foci in the left kidney, largest measures 9 mm in the lower pole. No mass or hydronephrosis visualized.  Bladder:  Appears normal for degree of bladder distention.  IMPRESSION: Bilateral nonobstructing stones. No hydronephrosis bilaterally.   Electronically Signed  By: Abelardo Diesel M.D.  On: 10/21/2014 16:33           Vitals     Height Weight BMI (Calculated)    6' (1.829 m) 264 lb 11.2 oz (120.067 kg) 36      Interpretation Summary     CLINICAL DATA: Assess for hydronephrosis. Nephrolithiasis.  EXAM: RENAL / URINARY TRACT ULTRASOUND COMPLETE  COMPARISON: CT abdomen pelvis March 29, 2014  FINDINGS: Right Kidney:  Length: 9.9 cm. There are echogenic foci within the right kidney, largest measures 1.1 cm in the mid to lower pole. No mass or hydronephrosis visualized.  Left Kidney:  Length: 9.2 cm. There are echogenic foci in the left kidney, largest measures 9 mm in the lower pole. No mass or hydronephrosis visualized.  Bladder:  Appears normal for degree of bladder distention.  IMPRESSION: Bilateral nonobstructing stones. No hydronephrosis bilaterally.     Assessment & Plan:   1. Nephrolithiasis The patient has bilateral nonobstructing stones. On the right side the  largest is 1.1 cm on the left side largest 9 mm. the patient is currently a symptomatically stones. They're not causing obstruction therefore are not affecting his renal function. I discussed the treatment options for the stone with the patient. These include watchful waiting, ureteroscopy, and lithotripsy. He is elected to undergo watchful waiting. He is aware that at some point the stones may become symptomatic if she moved to his ureter. We will have him follow up in 6 months for KUB x-ray to ensure there are not enlarging. There is no urological explanation for his decreased renal function.  2. History of prostate cancer The patient is being  followed by his primary care doctor with PSAs. It is unclear if his PSA ever became undetectable after surgery. His current PSA is 2.8 after 20 years from his prostatectomy. We'll recommend that his primary care doctor continue to check PSAs regularly. If.there is a significant bump in PSA or if the patient has bone pain, he should be referred back to urology. More than likely this will never be an issue for the patient since it's been 20 years since his surgery. It still however should be followed. We'll discuss repeating a PSA at his next appointment in 6 month.  3. History of penile cancer No evidence of disease.    Nickie Retort, MD  [facility] [address] [place], [place] [address] [phone]

## 2014-10-28 ENCOUNTER — Encounter: Payer: Self-pay | Admitting: Sports Medicine

## 2014-10-28 ENCOUNTER — Ambulatory Visit (INDEPENDENT_AMBULATORY_CARE_PROVIDER_SITE_OTHER): Payer: Medicare Other | Admitting: Sports Medicine

## 2014-10-28 DIAGNOSIS — M79676 Pain in unspecified toe(s): Secondary | ICD-10-CM | POA: Diagnosis not present

## 2014-10-28 DIAGNOSIS — B351 Tinea unguium: Secondary | ICD-10-CM

## 2014-10-28 DIAGNOSIS — E119 Type 2 diabetes mellitus without complications: Secondary | ICD-10-CM

## 2014-10-28 NOTE — Progress Notes (Signed)
Patient ID: [name], male   DOB: [birth year], 81 y.o.   MRN: [id number]  Subjective: [name] is a 81 y.o. male patient with history of diabetes who returns to office today complaining of long, painful nails  while ambulating in shoes. Patient states that the glucose reading this morning was 142 mg/dl. Diagnosed with DM 5 years ago. Patient denies any new changes in medication or new problems. Patient denies any new cramping, numbness, burning or tingling in the legs.  Patient Active Problem List   Diagnosis Date Noted  . Benign essential HTN 07/06/2014  . Cough with expectoration 03/21/2014  . Obstructive apnea 03/21/2014  . AF (paroxysmal atrial fibrillation) ([place]) 03/21/2014  . H/O adenomatous polyp of colon 06/28/2013  . Hepatic cyst 06/28/2013  . Chronic kidney disease 06/08/2013  . Diabetes ([place]) 06/08/2013  . Combined fat and carbohydrate induced hyperlipemia 06/08/2013  . Heart valve disease 06/08/2013   Current Outpatient Prescriptions on File Prior to Visit  Medication Sig Dispense Refill  . Cholecalciferol (VITAMIN D3) 2000 UNITS capsule Take by mouth.    . Cyanocobalamin (RA VITAMIN B-12 TR) 1000 MCG TBCR Take by mouth.    . dabigatran (PRADAXA) 150 MG CAPS capsule TAKE 1 CAPSULE BY MOUTH TWICE DAILY    . diltiazem (CARTIA XT) 120 MG 24 hr capsule TAKE 1 CAPSULE BY MOUTH EVERY MORNING    . glimepiride (AMARYL) 1 MG tablet Take by mouth.    Marland Kitchen glucose blood (ONETOUCH VERIO) test strip      No current facility-administered medications on file prior to visit.    No Known Allergies   Labs:HEMOGLOBIN A1C- No recent result on file   Objective: General: Patient is awake, alert, and oriented x 3 and in no acute distress.  Integument: Skin is warm, dry and supple bilateral. Nails are tender, long, thickened and  dystrophic with subungual debris, consistent with onychomycosis, 1-5 bilateral. Mild 3-4 webspace maceration with no signs of infection. No open  lesions or preulcerative lesions present bilateral. Remaining integument unremarkable.  Vasculature:  Dorsalis Pedis pulse 2/4 bilateral. Posterior Tibial pulse  1/4 bilateral.  Capillary fill time <3 sec 1-5 bilateral. Positive hair growth to the level of the digits. Temperature gradient within normal limits. No varicosities present bilateral. No edema present bilateral.   Neurology: The patient has intact sensation measured with a 5.07/10g Semmes Weinstein Monofilament at all pedal sites bilateral . Vibratory sensation diminished bilateral with tuning fork. No Babinski sign present bilateral.   Musculoskeletal: No gross pedal deformities noted bilateral. Muscular strength 5/5 in all lower extremity muscular groups bilateral without pain or limitation on range of motion . No tenderness with calf compression bilateral.  Assessment and Plan: Problem List Items Addressed This Visit      Endocrine   Diabetes ([place])    Other Visit Diagnoses    Dermatophytosis of nail    -  Primary    Pain of toe, unspecified laterality          -Examined patient. -Discussed and educated patient on diabetic foot care, especially with  regards to the vascular, neurological and musculoskeletal systems.  -Stressed the importance of good glycemic control and the detriment of not  controlling glucose levels in relation to the foot. -Mechanically debrided all nails 1-5 bilateral using sterile nail nipper and filed with dremel without incident  -Recommend patient to dry well in between toes after showering; will closely monitor  -Answered all patient questions -Patient to return as needed or  in 3 months for at risk foot care -Patient advised to call the office if any problems or questions arise in the  Meantime.  Landis Martins, DPM

## 2014-11-20 DIAGNOSIS — G4733 Obstructive sleep apnea (adult) (pediatric): Secondary | ICD-10-CM | POA: Diagnosis not present

## 2014-11-22 DIAGNOSIS — N200 Calculus of kidney: Secondary | ICD-10-CM

## 2014-11-22 HISTORY — DX: Calculus of kidney: N20.0

## 2014-12-21 DIAGNOSIS — G4733 Obstructive sleep apnea (adult) (pediatric): Secondary | ICD-10-CM | POA: Diagnosis not present

## 2014-12-28 DIAGNOSIS — G4733 Obstructive sleep apnea (adult) (pediatric): Secondary | ICD-10-CM | POA: Diagnosis not present

## 2014-12-28 DIAGNOSIS — R05 Cough: Secondary | ICD-10-CM | POA: Diagnosis not present

## 2014-12-28 DIAGNOSIS — I10 Essential (primary) hypertension: Secondary | ICD-10-CM | POA: Diagnosis not present

## 2014-12-28 DIAGNOSIS — I480 Paroxysmal atrial fibrillation: Secondary | ICD-10-CM | POA: Diagnosis not present

## 2015-01-20 DIAGNOSIS — G4733 Obstructive sleep apnea (adult) (pediatric): Secondary | ICD-10-CM | POA: Diagnosis not present

## 2015-01-21 ENCOUNTER — Emergency Department: Payer: Medicare Other

## 2015-01-21 ENCOUNTER — Encounter: Payer: Self-pay | Admitting: Emergency Medicine

## 2015-01-21 ENCOUNTER — Emergency Department
Admission: EM | Admit: 2015-01-21 | Discharge: 2015-01-21 | Disposition: A | Discharge status: Home / Self Care | Payer: Medicare Other | Attending: Emergency Medicine | Admitting: Emergency Medicine

## 2015-01-21 DIAGNOSIS — N189 Chronic kidney disease, unspecified: Secondary | ICD-10-CM | POA: Insufficient documentation

## 2015-01-21 DIAGNOSIS — E119 Type 2 diabetes mellitus without complications: Secondary | ICD-10-CM | POA: Insufficient documentation

## 2015-01-21 DIAGNOSIS — Z79899 Other long term (current) drug therapy: Secondary | ICD-10-CM | POA: Diagnosis not present

## 2015-01-21 DIAGNOSIS — Z87891 Personal history of nicotine dependence: Secondary | ICD-10-CM | POA: Diagnosis not present

## 2015-01-21 DIAGNOSIS — R51 Headache: Secondary | ICD-10-CM | POA: Insufficient documentation

## 2015-01-21 DIAGNOSIS — E860 Dehydration: Secondary | ICD-10-CM | POA: Insufficient documentation

## 2015-01-21 DIAGNOSIS — R519 Headache, unspecified: Secondary | ICD-10-CM

## 2015-01-21 DIAGNOSIS — I129 Hypertensive chronic kidney disease with stage 1 through stage 4 chronic kidney disease, or unspecified chronic kidney disease: Secondary | ICD-10-CM | POA: Diagnosis not present

## 2015-01-21 DIAGNOSIS — R05 Cough: Secondary | ICD-10-CM | POA: Insufficient documentation

## 2015-01-21 DIAGNOSIS — R059 Cough, unspecified: Secondary | ICD-10-CM

## 2015-01-21 HISTORY — DX: Calculus of kidney: N20.0

## 2015-01-21 LAB — BASIC METABOLIC PANEL
Anion gap: 6 (ref 5–15)
BUN: 26 mg/dL — ABNORMAL HIGH (ref 6–20)
CO2: 26 mmol/L (ref 22–32)
Calcium: 9.2 mg/dL (ref 8.9–10.3)
Chloride: 107 mmol/L (ref 101–111)
Creatinine, Ser: 1.73 mg/dL — ABNORMAL HIGH (ref 0.61–1.24)
GFR calc Af Amer: 41 mL/min — ABNORMAL LOW (ref >60)
GFR calc non Af Amer: 36 mL/min — ABNORMAL LOW (ref >60)
Glucose, Bld: 121 mg/dL — ABNORMAL HIGH (ref 65–99)
Potassium: 4.6 mmol/L (ref 3.5–5.1)
Sodium: 139 mmol/L (ref 135–145)

## 2015-01-21 LAB — CBC WITH DIFFERENTIAL/PLATELET
Basophils Absolute: 0 10*3/uL (ref 0–0.1)
Basophils Relative: 1 %
Eosinophils Absolute: 0.3 10*3/uL (ref 0–0.7)
Eosinophils Relative: 5 %
HCT: 44 % (ref 40.0–52.0)
Hemoglobin: 14.4 g/dL (ref 13.0–18.0)
Lymphocytes Relative: 37 %
Lymphs Abs: 2.2 10*3/uL (ref 1.0–3.6)
MCH: 29.3 pg (ref 26.0–34.0)
MCHC: 32.6 g/dL (ref 32.0–36.0)
MCV: 89.8 fL (ref 80.0–100.0)
Monocytes Absolute: 0.9 10*3/uL (ref 0.2–1.0)
Monocytes Relative: 14 %
Neutro Abs: 2.6 10*3/uL (ref 1.4–6.5)
Neutrophils Relative %: 43 %
Platelets: 175 10*3/uL (ref 150–440)
RBC: 4.9 MIL/uL (ref 4.40–5.90)
RDW: 15.9 % — ABNORMAL HIGH (ref 11.5–14.5)
WBC: 6 10*3/uL (ref 3.8–10.6)

## 2015-01-21 LAB — PROTIME-INR
INR: 1.66
Prothrombin Time: 19.6 seconds — ABNORMAL HIGH (ref 11.4–15.0)

## 2015-01-21 LAB — SEDIMENTATION RATE: Sed Rate: 9 mm/hr (ref 0–20)

## 2015-01-21 MED ORDER — SODIUM CHLORIDE 0.9 % IV BOLUS (SEPSIS)
500.0000 mL | Freq: Once | INTRAVENOUS | Status: AC
  Administered 2015-01-21: 500 mL via INTRAVENOUS

## 2015-01-21 MED ORDER — KETOROLAC TROMETHAMINE 30 MG/ML IJ SOLN
10.0000 mg | Freq: Once | INTRAMUSCULAR | Status: AC
  Administered 2015-01-21: 9.9 mg via INTRAVENOUS
  Filled 2015-01-21: qty 1

## 2015-01-21 MED ORDER — ONDANSETRON HCL 4 MG/2ML IJ SOLN
4.0000 mg | Freq: Once | INTRAMUSCULAR | Status: AC
  Administered 2015-01-21: 4 mg via INTRAVENOUS
  Filled 2015-01-21: qty 2

## 2015-01-21 NOTE — ED Notes (Signed)
Patient returned from CT

## 2015-01-21 NOTE — Discharge Instructions (Signed)
1. Drink plenty of fluids daily. 2. Return to the ER for worsening symptoms, persistent vomiting, lethargy or other concerns.  General Headache Without Cause A headache is pain or discomfort felt around the head or neck area. The specific cause of a headache may not be found. There are many causes and types of headaches. A few common ones are:  Tension headaches.  Migraine headaches.  Cluster headaches.  Chronic daily headaches. HOME CARE INSTRUCTIONS  Watch your condition for any changes. Take these steps to help with your condition: Managing Pain  Take over-the-counter and prescription medicines only as told by your health care provider.  Lie down in a dark, quiet room when you have a headache.  If directed, apply ice to the head and neck area:  Put ice in a plastic bag.  Place a towel between your skin and the bag.  Leave the ice on for 20 minutes, 2-3 times per day.  Use a heating pad or hot shower to apply heat to the head and neck area as told by your health care provider.  Keep lights dim if bright lights bother you or make your headaches worse. Eating and Drinking  Eat meals on a regular schedule.  Limit alcohol use.  Decrease the amount of caffeine you drink, or stop drinking caffeine. General Instructions  Keep all follow-up visits as told by your health care provider. This is important.  Keep a headache journal to help find out what may trigger your headaches. For example, write down:  What you eat and drink.  How much sleep you get.  Any change to your diet or medicines.  Try massage or other relaxation techniques.  Limit stress.  Sit up straight, and do not tense your muscles.  Do not use tobacco products, including cigarettes, chewing tobacco, or e-cigarettes. If you need help quitting, ask your health care provider.  Exercise regularly as told by your health care provider.  Sleep on a regular schedule. Get 7-9 hours of sleep, or the amount  recommended by your health care provider. SEEK MEDICAL CARE IF:   Your symptoms are not helped by medicine.  You have a headache that is different from the usual headache.  You have nausea or you vomit.  You have a fever. SEEK IMMEDIATE MEDICAL CARE IF:   Your headache becomes severe.  You have repeated vomiting.  You have a stiff neck.  You have a loss of vision.  You have problems with speech.  You have pain in the eye or ear.  You have muscular weakness or loss of muscle control.  You lose your balance or have trouble walking.  You feel faint or pass out.  You have confusion.   This information is not intended to replace advice given to you by your health care provider. Make sure you discuss any questions you have with your health care provider.   Document Released: 01/07/2005 Document Revised: 09/28/2014 Document Reviewed: 05/02/2014 Elsevier Interactive Patient Education 2016 Elsevier Inc.  Dehydration Dehydration is when you lose more fluids from the body than you take in. Vital organs such as the kidneys, brain, and heart cannot function without a proper amount of fluids and salt. Any loss of fluids from the body can cause dehydration.  Older adults are at a higher risk of dehydration than younger adults. As we age, our bodies are less able to conserve water and do not respond to temperature changes as well. Also, older adults do not become thirsty as easily  or quickly. Because of this, older adults often do not realize they need to increase fluids to avoid dehydration.  CAUSES   Vomiting.  Diarrhea.  Excessive sweating.  Excessive urination.  Fever.  Certain medicines, such as blood pressure medicines called diuretics.  Poorly controlled blood sugars. SIGNS AND SYMPTOMS  Mild dehydration:  Thirst.  Dry lips.  Slightly dry mouth. Moderate dehydration:  Very dry mouth.  Sunken eyes.  Skin does not bounce back quickly when lightly pinched and  released.  Dark urine and decreased urine production.  Decreased tear production.  Headache. Severe dehydration:  Very dry mouth.  Extreme thirst.  Rapid, weak pulse (more than 100 beats per minute at rest).  Cold hands and feet.  Not able to sweat in spite of heat.  Rapid breathing.  Blue lips.  Confusion and lethargy.  Difficulty being awakened.  Minimal urine production.  No tears. DIAGNOSIS  Your health care provider will diagnose dehydration based on your symptoms and your exam. Blood and urine tests will help confirm the diagnosis. The diagnostic evaluation should also identify the cause of dehydration. TREATMENT  Treatment of mild or moderate dehydration can often be done at home by increasing the amount of fluids that you drink. It is best to drink small amounts of fluid more often. Drinking too much at one time can make vomiting worse. Severe dehydration needs to be treated at the hospital. You may be given IV fluids that contain water and electrolytes. HOME CARE INSTRUCTIONS   Ask your health care provider about specific rehydration instructions.  Drink enough fluids to keep your urine clear or pale yellow.  Drink small amounts frequently if you have nausea and vomiting.  Eat as you normally do.  Avoid:  Foods or drinks high in sugar.  Carbonated drinks.  Juice.  Extremely hot or cold fluids.  Drinks with caffeine.  Fatty, greasy foods.  Alcohol.  Tobacco.  Overeating.  Gelatin desserts.  Wash your hands well to avoid spreading bacteria and viruses.  Only take over-the-counter or prescription medicines for pain, discomfort, or fever as directed by your health care provider.  Ask your health care provider if you should continue all prescribed and over-the-counter medicines.  Keep all follow-up appointments with your health care provider. SEEK MEDICAL CARE IF:  You have abdominal pain, and it increases or stays in one area  (localizes).  You have a rash, stiff neck, or severe headache.  You are irritable, sleepy, or difficult to awaken.  You are weak, dizzy, or extremely thirsty.  You have a fever. SEEK IMMEDIATE MEDICAL CARE IF:   You are unable to keep fluids down, or you get worse despite treatment.  You have frequent episodes of vomiting or diarrhea.  You have blood or green matter (bile) in your vomit.  You have blood in your stool, or your stool looks black and tarry.  You have not urinated in 6-8 hours, or you have only urinated a small amount of very dark urine.  You faint. MAKE SURE YOU:   Understand these instructions.  Will watch your condition.  Will get help right away if you are not doing well or get worse.   This information is not intended to replace advice given to you by your health care provider. Make sure you discuss any questions you have with your health care provider.   Document Released: 03/30/2003 Document Revised: 01/12/2013 Document Reviewed: 09/14/2012 Elsevier Interactive Patient Education 2016 Elsevier Inc.  Cough, Adult Coughing is a  reflex that clears your throat and your airways. Coughing helps to heal and protect your lungs. It is normal to cough occasionally, but a cough that happens with other symptoms or lasts a long time may be a sign of a condition that needs treatment. A cough may last only 2-3 weeks (acute), or it may last longer than 8 weeks (chronic). CAUSES Coughing is commonly caused by:  Breathing in substances that irritate your lungs.  A viral or bacterial respiratory infection.  Allergies.  Asthma.  Postnasal drip.  Smoking.  Acid backing up from the stomach into the esophagus (gastroesophageal reflux).  Certain medicines.  Chronic lung problems, including COPD (or rarely, lung cancer).  Other medical conditions such as heart failure. HOME CARE INSTRUCTIONS  Pay attention to any changes in your symptoms. Take these actions to  help with your discomfort:  Take medicines only as told by your health care provider.  If you were prescribed an antibiotic medicine, take it as told by your health care provider. Do not stop taking the antibiotic even if you start to feel better.  Talk with your health care provider before you take a cough suppressant medicine.  Drink enough fluid to keep your urine clear or pale yellow.  If the air is dry, use a cold steam vaporizer or humidifier in your bedroom or your home to help loosen secretions.  Avoid anything that causes you to cough at work or at home.  If your cough is worse at night, try sleeping in a semi-upright position.  Avoid cigarette smoke. If you smoke, quit smoking. If you need help quitting, ask your health care provider.  Avoid caffeine.  Avoid alcohol.  Rest as needed. SEEK MEDICAL CARE IF:   You have new symptoms.  You cough up pus.  Your cough does not get better after 2-3 weeks, or your cough gets worse.  You cannot control your cough with suppressant medicines and you are losing sleep.  You develop pain that is getting worse or pain that is not controlled with pain medicines.  You have a fever.  You have unexplained weight loss.  You have night sweats. SEEK IMMEDIATE MEDICAL CARE IF:  You cough up blood.  You have difficulty breathing.  Your heartbeat is very fast.   This information is not intended to replace advice given to you by your health care provider. Make sure you discuss any questions you have with your health care provider.   Document Released: 07/06/2010 Document Revised: 09/28/2014 Document Reviewed: 03/16/2014 Elsevier Interactive Patient Education [employer].

## 2015-01-21 NOTE — ED Notes (Signed)
Per EMS ptient was at home and experienced his "worst headache ever" and called 911 to come take him to the ER.  Patient had been give Tylenol (unknown dose, 1 pill) and some white vinegar.  Patient DOES NOT have a hx of Hypertension.  He is diabetic and takes Limopride qd.  Patients wife reports he has had a severe cough since summertime.

## 2015-01-21 NOTE — ED Notes (Signed)
Patient transported to CT 

## 2015-01-21 NOTE — ED Provider Notes (Signed)
[facility] Emergency Department Provider Note  ____________________________________________  Time seen: Approximately 2:51 AM  I have reviewed the triage vital signs and the nursing notes.   HISTORY  Chief Complaint Headache    HPI [name] is a 81 y.o. male who presents to the ED from home via [place] a chief complain of headache. Patient was watching TV when he experienced a gradual onset of right-sided headache approximately 11 PM. Initially he did not complain to his wife but subsequently could not withstand the pain. Describes nonradiating throbbing and sharp type pain to his right temple. Symptoms not associated with vision changes, neck pain, nausea, vomiting, weakness, numbness or tingling. Patient denies recent fever, chills, chest pain, shortness of breath. Wife states he has had a dry cough since summer. Patient states head pain is worsened by coughing. Denies recent travel or trauma. Patient is on Pradaxa for atrial fibrillation. Tylenol taken prior to arrival did not help his headache.   Past Medical History  Diagnosis Date  . Hearing loss   . Diabetes ([place])   . High blood pressure   . History of bladder problems   . Kidney stones 11/22/2014    Patient Active Problem List   Diagnosis Date Noted  . Benign essential HTN 07/06/2014  . Cough with expectoration 03/21/2014  . Obstructive apnea 03/21/2014  . AF (paroxysmal atrial fibrillation) ([place]) 03/21/2014  . H/O adenomatous polyp of colon 06/28/2013  . Hepatic cyst 06/28/2013  . Chronic kidney disease 06/08/2013  . Diabetes ([place]) 06/08/2013  . Combined fat and carbohydrate induced hyperlipemia 06/08/2013  . Heart valve disease 06/08/2013    Past Surgical History  Procedure Laterality Date  . Prostate cancer    . Prostate surgery      Current Outpatient Rx  Name  Route  Sig  Dispense  Refill  . Cholecalciferol (VITAMIN D3) 2000 UNITS capsule   Oral   Take by mouth.          . Cyanocobalamin (RA VITAMIN B-12 TR) 1000 MCG TBCR   Oral   Take by mouth.         . dabigatran (PRADAXA) 150 MG CAPS capsule      TAKE 1 CAPSULE BY MOUTH TWICE DAILY         . diltiazem (CARTIA XT) 120 MG 24 hr capsule      TAKE 1 CAPSULE BY MOUTH EVERY MORNING         . glimepiride (AMARYL) 1 MG tablet   Oral   Take by mouth.         Marland Kitchen glucose blood (ONETOUCH VERIO) test strip                 Allergies Review of patient's allergies indicates no known allergies.  Family History  Problem Relation Age of Onset  . Cancer Mother   . Diabetes    . High blood pressure    . Prostate cancer Brother     Social History Social History  Substance Use Topics  . Smoking status: Former Smoker -- 16 years    Types: Cigarettes  . Smokeless tobacco: Never Used  . Alcohol Use: No    Review of Systems Constitutional: No fever/chills Eyes: No visual changes. ENT: No sore throat. Cardiovascular: Denies chest pain. Respiratory: Denies shortness of breath. Gastrointestinal: No abdominal pain.  No nausea, no vomiting.  No diarrhea.  No constipation. Genitourinary: Negative for dysuria. Musculoskeletal: Negative for back pain. Skin: Negative for rash. Neurological:  Positive for headache. Negative for focal weakness or numbness.  10-point ROS otherwise negative.  ____________________________________________   PHYSICAL EXAM:  VITAL SIGNS: ED Triage Vitals  Enc Vitals Group     BP 01/21/15 0147 137/92 mmHg     Pulse Rate 01/21/15 0147 75     Resp 01/21/15 0147 18     Temp 01/21/15 0147 97.7 F (36.5 C)     Temp Source 01/21/15 0147 Oral     SpO2 01/21/15 0147 97 %     Weight --      Height --      Head Cir --      Peak Flow --      Pain Score 01/21/15 0149 6     Pain Loc --      Pain Edu? --      Excl. in [place]? --     Constitutional: Alert and oriented. Well appearing and in mild acute distress. Eyes: Conjunctivae are normal. PERRL. EOMI. Funduscopy  within normal limits. Head: Atraumatic. No facial or sinus tenderness to palpation. No tenderness at temples. Nose: No congestion/rhinnorhea. Mouth/Throat: Mucous membranes are moist.  Oropharynx non-erythematous. Neck: No stridor.  Supple neck without evidence for meningismus. No carotid bruits. Cardiovascular: Normal rate, regular rhythm. Grossly normal heart sounds.  Good peripheral circulation. Respiratory: Normal respiratory effort.  No retractions. Lungs CTAB. Gastrointestinal: Soft and nontender. No distention. No abdominal bruits. No CVA tenderness. Musculoskeletal: No lower extremity tenderness nor edema.  No joint effusions. Neurologic:  Normal speech and language. Her and oriented 4. CN II-XII grossly intact. No gross focal neurologic deficits are appreciated. No gait instability. Skin:  Skin is warm, dry and intact. No rash noted. Psychiatric: Mood and affect are normal. Speech and behavior are normal.  ____________________________________________   LABS (all labs ordered are listed, but only abnormal results are displayed)  Labs Reviewed  CBC WITH DIFFERENTIAL/PLATELET - Abnormal; Notable for the following:    RDW 15.9 (*)    All other components within normal limits  BASIC METABOLIC PANEL - Abnormal; Notable for the following:    Glucose, Bld 121 (*)    BUN 26 (*)    Creatinine, Ser 1.73 (*)    GFR calc non Af Amer 36 (*)    GFR calc Af Amer 41 (*)    All other components within normal limits  PROTIME-INR - Abnormal; Notable for the following:    Prothrombin Time 19.6 (*)    All other components within normal limits  SEDIMENTATION RATE   ____________________________________________  EKG  ED ECG REPORT I, Chariti Havel J, the attending physician, personally viewed and interpreted this ECG.   Date: 01/21/2015  EKG Time: 0143  Rate: 64  Rhythm: normal EKG, normal sinus rhythm  Axis: LAD  Intervals:none  ST&T Change:  Nonspecific  ____________________________________________  RADIOLOGY  CT Head without contrast interpreted per Dr. Quintella Reichert: No acute intracranial hemorrhage.  Mild age-related atrophy and chronic microvascular ischemic disease.  If symptoms persist and there are no contraindications, MRI may provide better evaluation if clinically indicated.  Chest x-ray (viewed by me, interpreted per Dr. Quintella Reichert): No active disease. ____________________________________________   PROCEDURES  Procedure(s) performed: None  Critical Care performed: No  ____________________________________________   INITIAL IMPRESSION / ASSESSMENT AND PLAN / ED COURSE  Pertinent labs & imaging results that were available during my care of the patient were reviewed by me and considered in my medical decision making (see chart for details).  81 year old male who presents to the ED  for gradual onset right-sided headache. Without intervention patient is currently rating his headache a 5/10 which is improved from earlier. Will obtain screening lab work including sedimentation rate, CT head without contrast, IV analgesia and reassess.  ----------------------------------------- 4:44 AM on 01/21/2015 -----------------------------------------  Patient is soundly asleep. Awaken to reassess pain which patient rates as 3/10. Updated patient and spouse of laboratory and imaging results. Blood pressure is improved. Strict return precautions given. Both verbalize understanding and agree with plan of care. ____________________________________________   FINAL CLINICAL IMPRESSION(S) / ED DIAGNOSES  Final diagnoses:  Acute nonintractable headache, unspecified headache type  Cough  Dehydration      Paulette Blanch, MD 01/21/15 1137

## 2015-01-27 ENCOUNTER — Ambulatory Visit: Payer: Medicare Other | Admitting: Sports Medicine

## 2015-02-20 DIAGNOSIS — G4733 Obstructive sleep apnea (adult) (pediatric): Secondary | ICD-10-CM | POA: Diagnosis not present

## 2015-03-21 DIAGNOSIS — G4733 Obstructive sleep apnea (adult) (pediatric): Secondary | ICD-10-CM | POA: Diagnosis not present

## 2015-04-20 DIAGNOSIS — G4733 Obstructive sleep apnea (adult) (pediatric): Secondary | ICD-10-CM | POA: Diagnosis not present

## 2015-04-24 DIAGNOSIS — B351 Tinea unguium: Secondary | ICD-10-CM | POA: Diagnosis not present

## 2015-04-24 DIAGNOSIS — M79675 Pain in left toe(s): Secondary | ICD-10-CM | POA: Diagnosis not present

## 2015-04-24 DIAGNOSIS — E1159 Type 2 diabetes mellitus with other circulatory complications: Secondary | ICD-10-CM | POA: Diagnosis not present

## 2015-04-24 DIAGNOSIS — M79674 Pain in right toe(s): Secondary | ICD-10-CM | POA: Diagnosis not present

## 2015-04-27 ENCOUNTER — Ambulatory Visit: Payer: Medicare Other

## 2015-05-18 DIAGNOSIS — I4891 Unspecified atrial fibrillation: Secondary | ICD-10-CM | POA: Diagnosis not present

## 2015-05-18 DIAGNOSIS — I10 Essential (primary) hypertension: Secondary | ICD-10-CM | POA: Diagnosis not present

## 2015-05-18 DIAGNOSIS — Z0001 Encounter for general adult medical examination with abnormal findings: Secondary | ICD-10-CM | POA: Diagnosis not present

## 2015-05-18 DIAGNOSIS — E1165 Type 2 diabetes mellitus with hyperglycemia: Secondary | ICD-10-CM | POA: Diagnosis not present

## 2015-05-21 DIAGNOSIS — G4733 Obstructive sleep apnea (adult) (pediatric): Secondary | ICD-10-CM | POA: Diagnosis not present

## 2015-06-08 DIAGNOSIS — E113393 Type 2 diabetes mellitus with moderate nonproliferative diabetic retinopathy without macular edema, bilateral: Secondary | ICD-10-CM | POA: Diagnosis not present

## 2015-06-16 DIAGNOSIS — H2513 Age-related nuclear cataract, bilateral: Secondary | ICD-10-CM | POA: Diagnosis not present

## 2015-06-16 DIAGNOSIS — H02839 Dermatochalasis of unspecified eye, unspecified eyelid: Secondary | ICD-10-CM | POA: Diagnosis not present

## 2015-06-16 DIAGNOSIS — H18413 Arcus senilis, bilateral: Secondary | ICD-10-CM | POA: Diagnosis not present

## 2015-06-16 DIAGNOSIS — E119 Type 2 diabetes mellitus without complications: Secondary | ICD-10-CM | POA: Diagnosis not present

## 2015-06-20 DIAGNOSIS — G4733 Obstructive sleep apnea (adult) (pediatric): Secondary | ICD-10-CM | POA: Diagnosis not present

## 2015-06-21 DIAGNOSIS — I10 Essential (primary) hypertension: Secondary | ICD-10-CM | POA: Diagnosis not present

## 2015-06-21 DIAGNOSIS — E119 Type 2 diabetes mellitus without complications: Secondary | ICD-10-CM | POA: Diagnosis not present

## 2015-07-14 DIAGNOSIS — H2511 Age-related nuclear cataract, right eye: Secondary | ICD-10-CM | POA: Diagnosis not present

## 2015-07-17 DIAGNOSIS — I480 Paroxysmal atrial fibrillation: Secondary | ICD-10-CM | POA: Diagnosis not present

## 2015-07-17 DIAGNOSIS — G4733 Obstructive sleep apnea (adult) (pediatric): Secondary | ICD-10-CM | POA: Diagnosis not present

## 2015-07-17 DIAGNOSIS — Z01818 Encounter for other preprocedural examination: Secondary | ICD-10-CM | POA: Diagnosis not present

## 2015-07-17 DIAGNOSIS — I38 Endocarditis, valve unspecified: Secondary | ICD-10-CM | POA: Diagnosis not present

## 2015-07-17 DIAGNOSIS — I10 Essential (primary) hypertension: Secondary | ICD-10-CM | POA: Diagnosis not present

## 2015-07-18 DIAGNOSIS — I480 Paroxysmal atrial fibrillation: Secondary | ICD-10-CM | POA: Diagnosis not present

## 2015-07-19 DIAGNOSIS — I480 Paroxysmal atrial fibrillation: Secondary | ICD-10-CM | POA: Diagnosis not present

## 2015-07-19 DIAGNOSIS — I10 Essential (primary) hypertension: Secondary | ICD-10-CM | POA: Diagnosis not present

## 2015-07-19 DIAGNOSIS — Z01818 Encounter for other preprocedural examination: Secondary | ICD-10-CM | POA: Diagnosis not present

## 2015-07-19 DIAGNOSIS — E782 Mixed hyperlipidemia: Secondary | ICD-10-CM | POA: Diagnosis not present

## 2015-07-19 DIAGNOSIS — G4733 Obstructive sleep apnea (adult) (pediatric): Secondary | ICD-10-CM | POA: Diagnosis not present

## 2015-07-21 DIAGNOSIS — G4733 Obstructive sleep apnea (adult) (pediatric): Secondary | ICD-10-CM | POA: Diagnosis not present

## 2015-07-31 DIAGNOSIS — H2511 Age-related nuclear cataract, right eye: Secondary | ICD-10-CM | POA: Diagnosis not present

## 2015-07-31 DIAGNOSIS — I480 Paroxysmal atrial fibrillation: Secondary | ICD-10-CM | POA: Diagnosis not present

## 2015-08-01 DIAGNOSIS — H2512 Age-related nuclear cataract, left eye: Secondary | ICD-10-CM | POA: Diagnosis not present

## 2015-08-14 DIAGNOSIS — H2512 Age-related nuclear cataract, left eye: Secondary | ICD-10-CM | POA: Diagnosis not present

## 2015-08-20 DIAGNOSIS — G4733 Obstructive sleep apnea (adult) (pediatric): Secondary | ICD-10-CM | POA: Diagnosis not present

## 2015-08-25 DIAGNOSIS — H20041 Secondary noninfectious iridocyclitis, right eye: Secondary | ICD-10-CM | POA: Diagnosis not present

## 2015-08-29 DIAGNOSIS — H20041 Secondary noninfectious iridocyclitis, right eye: Secondary | ICD-10-CM | POA: Diagnosis not present

## 2015-08-31 DIAGNOSIS — Z0001 Encounter for general adult medical examination with abnormal findings: Secondary | ICD-10-CM | POA: Diagnosis not present

## 2015-08-31 DIAGNOSIS — I10 Essential (primary) hypertension: Secondary | ICD-10-CM | POA: Diagnosis not present

## 2015-08-31 DIAGNOSIS — E782 Mixed hyperlipidemia: Secondary | ICD-10-CM | POA: Diagnosis not present

## 2015-08-31 DIAGNOSIS — E1165 Type 2 diabetes mellitus with hyperglycemia: Secondary | ICD-10-CM | POA: Diagnosis not present

## 2015-08-31 DIAGNOSIS — Z1159 Encounter for screening for other viral diseases: Secondary | ICD-10-CM | POA: Diagnosis not present

## 2015-09-05 DIAGNOSIS — I679 Cerebrovascular disease, unspecified: Secondary | ICD-10-CM | POA: Diagnosis not present

## 2015-09-05 DIAGNOSIS — E1165 Type 2 diabetes mellitus with hyperglycemia: Secondary | ICD-10-CM | POA: Diagnosis not present

## 2015-09-05 DIAGNOSIS — I10 Essential (primary) hypertension: Secondary | ICD-10-CM | POA: Diagnosis not present

## 2015-09-05 DIAGNOSIS — I4891 Unspecified atrial fibrillation: Secondary | ICD-10-CM | POA: Diagnosis not present

## 2015-09-05 DIAGNOSIS — R944 Abnormal results of kidney function studies: Secondary | ICD-10-CM | POA: Diagnosis not present

## 2015-09-20 DIAGNOSIS — G4733 Obstructive sleep apnea (adult) (pediatric): Secondary | ICD-10-CM | POA: Diagnosis not present

## 2015-10-21 DIAGNOSIS — G4733 Obstructive sleep apnea (adult) (pediatric): Secondary | ICD-10-CM | POA: Diagnosis not present

## 2015-11-02 DIAGNOSIS — S6710XA Crushing injury of unspecified finger(s), initial encounter: Secondary | ICD-10-CM | POA: Diagnosis not present

## 2015-11-02 DIAGNOSIS — S67197A Crushing injury of left little finger, initial encounter: Secondary | ICD-10-CM | POA: Diagnosis not present

## 2015-11-20 DIAGNOSIS — G4733 Obstructive sleep apnea (adult) (pediatric): Secondary | ICD-10-CM | POA: Diagnosis not present

## 2015-11-30 DIAGNOSIS — E1165 Type 2 diabetes mellitus with hyperglycemia: Secondary | ICD-10-CM | POA: Diagnosis not present

## 2015-11-30 DIAGNOSIS — R945 Abnormal results of liver function studies: Secondary | ICD-10-CM | POA: Diagnosis not present

## 2015-12-04 DIAGNOSIS — R944 Abnormal results of kidney function studies: Secondary | ICD-10-CM | POA: Diagnosis not present

## 2015-12-04 DIAGNOSIS — E1165 Type 2 diabetes mellitus with hyperglycemia: Secondary | ICD-10-CM | POA: Diagnosis not present

## 2015-12-04 DIAGNOSIS — I4891 Unspecified atrial fibrillation: Secondary | ICD-10-CM | POA: Diagnosis not present

## 2015-12-06 DIAGNOSIS — E083293 Diabetes mellitus due to underlying condition with mild nonproliferative diabetic retinopathy without macular edema, bilateral: Secondary | ICD-10-CM | POA: Diagnosis not present

## 2015-12-21 DIAGNOSIS — G4733 Obstructive sleep apnea (adult) (pediatric): Secondary | ICD-10-CM | POA: Diagnosis not present

## 2016-01-20 DIAGNOSIS — G4733 Obstructive sleep apnea (adult) (pediatric): Secondary | ICD-10-CM | POA: Diagnosis not present

## 2016-01-30 DIAGNOSIS — N183 Chronic kidney disease, stage 3 (moderate): Secondary | ICD-10-CM | POA: Diagnosis not present

## 2016-01-30 DIAGNOSIS — I10 Essential (primary) hypertension: Secondary | ICD-10-CM | POA: Diagnosis not present

## 2016-01-30 DIAGNOSIS — I480 Paroxysmal atrial fibrillation: Secondary | ICD-10-CM | POA: Diagnosis not present

## 2016-01-30 DIAGNOSIS — G4733 Obstructive sleep apnea (adult) (pediatric): Secondary | ICD-10-CM | POA: Diagnosis not present

## 2016-02-20 DIAGNOSIS — I4891 Unspecified atrial fibrillation: Secondary | ICD-10-CM | POA: Diagnosis not present

## 2016-02-20 DIAGNOSIS — G4733 Obstructive sleep apnea (adult) (pediatric): Secondary | ICD-10-CM | POA: Diagnosis not present

## 2016-02-20 DIAGNOSIS — I714 Abdominal aortic aneurysm, without rupture: Secondary | ICD-10-CM | POA: Diagnosis not present

## 2016-02-20 DIAGNOSIS — E1165 Type 2 diabetes mellitus with hyperglycemia: Secondary | ICD-10-CM | POA: Diagnosis not present

## 2016-02-20 DIAGNOSIS — I10 Essential (primary) hypertension: Secondary | ICD-10-CM | POA: Diagnosis not present

## 2016-02-20 DIAGNOSIS — I679 Cerebrovascular disease, unspecified: Secondary | ICD-10-CM | POA: Diagnosis not present

## 2016-02-24 DIAGNOSIS — H20041 Secondary noninfectious iridocyclitis, right eye: Secondary | ICD-10-CM | POA: Diagnosis not present

## 2016-02-28 DIAGNOSIS — E113393 Type 2 diabetes mellitus with moderate nonproliferative diabetic retinopathy without macular edema, bilateral: Secondary | ICD-10-CM | POA: Diagnosis not present

## 2016-03-20 DIAGNOSIS — G4733 Obstructive sleep apnea (adult) (pediatric): Secondary | ICD-10-CM | POA: Diagnosis not present

## 2016-04-08 ENCOUNTER — Encounter: Payer: Self-pay | Admitting: Podiatry

## 2016-04-08 ENCOUNTER — Ambulatory Visit (INDEPENDENT_AMBULATORY_CARE_PROVIDER_SITE_OTHER): Payer: Medicare Other | Admitting: Podiatry

## 2016-04-08 DIAGNOSIS — B351 Tinea unguium: Secondary | ICD-10-CM | POA: Diagnosis not present

## 2016-04-08 DIAGNOSIS — M79676 Pain in unspecified toe(s): Secondary | ICD-10-CM

## 2016-04-08 DIAGNOSIS — E119 Type 2 diabetes mellitus without complications: Secondary | ICD-10-CM

## 2016-04-08 NOTE — Progress Notes (Signed)
Complaint:  Visit Type: Patient returns to my office for continued preventative foot care services. Complaint: Patient states" my nails have grown long and thick and become painful to walk and wear shoes" Patient has been diagnosed with DM with no foot complications. The patient presents for preventative foot care services. No changes to ROS  Podiatric Exam: Vascular: dorsalis pedis and posterior tibial pulses are palpable bilateral. Capillary return is immediate. Temperature gradient is WNL. Skin turgor WNL  Sensorium: Normal Semmes Weinstein monofilament test. Normal tactile sensation bilaterally. Nail Exam: Pt has thick disfigured discolored nails with subungual debris noted bilateral entire nail hallux through fifth toenails Ulcer Exam: There is no evidence of ulcer or pre-ulcerative changes or infection. Orthopedic Exam: Muscle tone and strength are WNL. No limitations in general ROM. No crepitus or effusions noted. Foot type and digits show no abnormalities. Bony prominences are unremarkable. Skin: No Porokeratosis. No infection or ulcers  Diagnosis:  Onychomycosis, , Pain in right toe, pain in left toes  Treatment & Plan Procedures and Treatment: Consent by patient was obtained for treatment procedures. The patient understood the discussion of treatment and procedures well. All questions were answered thoroughly reviewed. Debridement of mycotic and hypertrophic toenails, 1 through 5 bilateral and clearing of subungual debris. No ulceration, no infection noted.  Return Visit-Office Procedure: Patient instructed to return to the office for a follow up visit 3 months for continued evaluation and treatment.    Lucendia Leard DPM 

## 2016-04-19 DIAGNOSIS — G4733 Obstructive sleep apnea (adult) (pediatric): Secondary | ICD-10-CM | POA: Diagnosis not present

## 2016-05-04 ENCOUNTER — Encounter: Payer: Self-pay | Admitting: Emergency Medicine

## 2016-05-04 ENCOUNTER — Emergency Department: Payer: Medicare Other

## 2016-05-04 ENCOUNTER — Observation Stay
Admission: EM | Admit: 2016-05-04 | Discharge: 2016-05-05 | Disposition: A | Discharge status: Home / Self Care | Payer: Medicare Other | Attending: Internal Medicine | Admitting: Internal Medicine

## 2016-05-04 DIAGNOSIS — Z87891 Personal history of nicotine dependence: Secondary | ICD-10-CM | POA: Insufficient documentation

## 2016-05-04 DIAGNOSIS — R079 Chest pain, unspecified: Secondary | ICD-10-CM | POA: Diagnosis not present

## 2016-05-04 DIAGNOSIS — E1122 Type 2 diabetes mellitus with diabetic chronic kidney disease: Secondary | ICD-10-CM | POA: Diagnosis not present

## 2016-05-04 DIAGNOSIS — R778 Other specified abnormalities of plasma proteins: Secondary | ICD-10-CM | POA: Insufficient documentation

## 2016-05-04 DIAGNOSIS — I480 Paroxysmal atrial fibrillation: Secondary | ICD-10-CM | POA: Diagnosis present

## 2016-05-04 DIAGNOSIS — E669 Obesity, unspecified: Secondary | ICD-10-CM | POA: Insufficient documentation

## 2016-05-04 DIAGNOSIS — Z7984 Long term (current) use of oral hypoglycemic drugs: Secondary | ICD-10-CM | POA: Insufficient documentation

## 2016-05-04 DIAGNOSIS — I130 Hypertensive heart and chronic kidney disease with heart failure and stage 1 through stage 4 chronic kidney disease, or unspecified chronic kidney disease: Secondary | ICD-10-CM | POA: Diagnosis not present

## 2016-05-04 DIAGNOSIS — R748 Abnormal levels of other serum enzymes: Secondary | ICD-10-CM | POA: Diagnosis not present

## 2016-05-04 DIAGNOSIS — Z79899 Other long term (current) drug therapy: Secondary | ICD-10-CM | POA: Diagnosis not present

## 2016-05-04 DIAGNOSIS — Z6835 Body mass index (BMI) 35.0-35.9, adult: Secondary | ICD-10-CM | POA: Diagnosis not present

## 2016-05-04 DIAGNOSIS — Z7901 Long term (current) use of anticoagulants: Secondary | ICD-10-CM | POA: Diagnosis not present

## 2016-05-04 DIAGNOSIS — N183 Chronic kidney disease, stage 3 (moderate): Secondary | ICD-10-CM | POA: Diagnosis not present

## 2016-05-04 DIAGNOSIS — R7989 Other specified abnormal findings of blood chemistry: Secondary | ICD-10-CM

## 2016-05-04 DIAGNOSIS — R0602 Shortness of breath: Secondary | ICD-10-CM | POA: Diagnosis not present

## 2016-05-04 DIAGNOSIS — N189 Chronic kidney disease, unspecified: Secondary | ICD-10-CM | POA: Diagnosis present

## 2016-05-04 DIAGNOSIS — R0789 Other chest pain: Secondary | ICD-10-CM | POA: Diagnosis not present

## 2016-05-04 DIAGNOSIS — I509 Heart failure, unspecified: Secondary | ICD-10-CM | POA: Diagnosis not present

## 2016-05-04 DIAGNOSIS — N184 Chronic kidney disease, stage 4 (severe): Secondary | ICD-10-CM | POA: Diagnosis present

## 2016-05-04 DIAGNOSIS — I110 Hypertensive heart disease with heart failure: Secondary | ICD-10-CM | POA: Diagnosis not present

## 2016-05-04 LAB — CBC
HCT: 42.3 % (ref 40.0–52.0)
Hemoglobin: 13.6 g/dL (ref 13.0–18.0)
MCH: 28.8 pg (ref 26.0–34.0)
MCHC: 32.2 g/dL (ref 32.0–36.0)
MCV: 89.6 fL (ref 80.0–100.0)
Platelets: 163 10*3/uL (ref 150–440)
RBC: 4.72 MIL/uL (ref 4.40–5.90)
RDW: 15.9 % — ABNORMAL HIGH (ref 11.5–14.5)
WBC: 6.1 10*3/uL (ref 3.8–10.6)

## 2016-05-04 LAB — BASIC METABOLIC PANEL
Anion gap: 5 (ref 5–15)
BUN: 20 mg/dL (ref 6–20)
CO2: 27 mmol/L (ref 22–32)
Calcium: 8.7 mg/dL — ABNORMAL LOW (ref 8.9–10.3)
Chloride: 108 mmol/L (ref 101–111)
Creatinine, Ser: 1.44 mg/dL — ABNORMAL HIGH (ref 0.61–1.24)
GFR calc Af Amer: 51 mL/min — ABNORMAL LOW (ref >60)
GFR calc non Af Amer: 44 mL/min — ABNORMAL LOW (ref >60)
Glucose, Bld: 97 mg/dL (ref 65–99)
Potassium: 4.4 mmol/L (ref 3.5–5.1)
Sodium: 140 mmol/L (ref 135–145)

## 2016-05-04 LAB — GLUCOSE, CAPILLARY: Glucose-Capillary: 76 mg/dL (ref 65–99)

## 2016-05-04 LAB — BRAIN NATRIURETIC PEPTIDE: B Natriuretic Peptide: 211 pg/mL — ABNORMAL HIGH (ref 0.0–100.0)

## 2016-05-04 LAB — TROPONIN I
Troponin I: 0.03 ng/mL (ref <0.03)
Troponin I: 0.03 ng/mL (ref <0.03)
Troponin I: 0.03 ng/mL (ref <0.03)

## 2016-05-04 MED ORDER — BISACODYL 10 MG RE SUPP: 10.0000 mg | Freq: Every day | RECTAL | Status: DC | PRN

## 2016-05-04 MED ORDER — NITROGLYCERIN 0.4 MG SL SUBL: 0.4000 mg | SUBLINGUAL_TABLET | SUBLINGUAL | Status: DC | PRN

## 2016-05-04 MED ORDER — MORPHINE SULFATE (PF) 2 MG/ML IV SOLN: 2.0000 mg | INTRAVENOUS | Status: DC | PRN

## 2016-05-04 MED ORDER — ASPIRIN EC 81 MG PO TBEC
81.0000 mg | DELAYED_RELEASE_TABLET | Freq: Every day | ORAL | Status: DC
  Administered 2016-05-05: 81 mg via ORAL
  Filled 2016-05-04: qty 1

## 2016-05-04 MED ORDER — ONDANSETRON HCL 4 MG PO TABS: 4.0000 mg | ORAL_TABLET | Freq: Four times a day (QID) | ORAL | Status: DC | PRN

## 2016-05-04 MED ORDER — ASPIRIN 81 MG PO CHEW
162.0000 mg | CHEWABLE_TABLET | Freq: Once | ORAL | Status: AC
  Administered 2016-05-04: 162 mg via ORAL

## 2016-05-04 MED ORDER — GLIPIZIDE 10 MG PO TABS
10.0000 mg | ORAL_TABLET | Freq: Two times a day (BID) | ORAL | Status: DC
  Administered 2016-05-05: 10 mg via ORAL
  Filled 2016-05-04: qty 1

## 2016-05-04 MED ORDER — SODIUM CHLORIDE 0.9 % IV SOLN
INTRAVENOUS | Status: DC
  Administered 2016-05-04: 23:00:00 via INTRAVENOUS

## 2016-05-04 MED ORDER — ACETAMINOPHEN 650 MG RE SUPP: 650.0000 mg | Freq: Four times a day (QID) | RECTAL | Status: DC | PRN

## 2016-05-04 MED ORDER — DILTIAZEM HCL ER COATED BEADS 120 MG PO CP24
120.0000 mg | ORAL_CAPSULE | Freq: Every day | ORAL | Status: DC
  Administered 2016-05-05: 120 mg via ORAL
  Filled 2016-05-04: qty 1

## 2016-05-04 MED ORDER — FAMOTIDINE IN NACL 20-0.9 MG/50ML-% IV SOLN
20.0000 mg | Freq: Two times a day (BID) | INTRAVENOUS | Status: DC
  Administered 2016-05-04 – 2016-05-05 (×2): 20 mg via INTRAVENOUS
  Filled 2016-05-04 (×3): qty 50

## 2016-05-04 MED ORDER — SODIUM CHLORIDE 0.9% FLUSH
3.0000 mL | Freq: Two times a day (BID) | INTRAVENOUS | Status: DC
  Administered 2016-05-04 – 2016-05-05 (×2): 3 mL via INTRAVENOUS

## 2016-05-04 MED ORDER — ONDANSETRON HCL 4 MG/2ML IJ SOLN: 4.0000 mg | Freq: Four times a day (QID) | INTRAMUSCULAR | Status: DC | PRN

## 2016-05-04 MED ORDER — INSULIN ASPART 100 UNIT/ML ~~LOC~~ SOLN
0.0000 [IU] | Freq: Three times a day (TID) | SUBCUTANEOUS | Status: DC
  Administered 2016-05-05: 1 [IU] via SUBCUTANEOUS
  Filled 2016-05-04: qty 1

## 2016-05-04 MED ORDER — DABIGATRAN ETEXILATE MESYLATE 150 MG PO CAPS
150.0000 mg | ORAL_CAPSULE | Freq: Two times a day (BID) | ORAL | Status: DC
  Administered 2016-05-04 – 2016-05-05 (×2): 150 mg via ORAL
  Filled 2016-05-04 (×2): qty 1

## 2016-05-04 MED ORDER — DOCUSATE SODIUM 100 MG PO CAPS
100.0000 mg | ORAL_CAPSULE | Freq: Two times a day (BID) | ORAL | Status: DC
  Administered 2016-05-04 – 2016-05-05 (×2): 100 mg via ORAL
  Filled 2016-05-04 (×2): qty 1

## 2016-05-04 MED ORDER — ASPIRIN 81 MG PO CHEW
324.0000 mg | CHEWABLE_TABLET | Freq: Once | ORAL | Status: DC
  Filled 2016-05-04: qty 4

## 2016-05-04 MED ORDER — ACETAMINOPHEN 325 MG PO TABS
650.0000 mg | ORAL_TABLET | Freq: Four times a day (QID) | ORAL | Status: DC | PRN
  Administered 2016-05-05: 650 mg via ORAL
  Filled 2016-05-04: qty 2

## 2016-05-04 MED ORDER — IOPAMIDOL (ISOVUE-370) INJECTION 76%
75.0000 mL | Freq: Once | INTRAVENOUS | Status: AC | PRN
  Administered 2016-05-04: 75 mL via INTRAVENOUS

## 2016-05-04 MED ORDER — ALUM & MAG HYDROXIDE-SIMETH 200-200-20 MG/5ML PO SUSP
30.0000 mL | ORAL | Status: DC | PRN
  Administered 2016-05-05: 30 mL via ORAL
  Filled 2016-05-04: qty 30

## 2016-05-04 MED ORDER — MORPHINE SULFATE (PF) 4 MG/ML IV SOLN
4.0000 mg | Freq: Once | INTRAVENOUS | Status: AC
  Administered 2016-05-04: 4 mg via INTRAVENOUS
  Filled 2016-05-04: qty 1

## 2016-05-04 NOTE — ED Provider Notes (Signed)
[facility] Emergency Department Provider Note  ____________________________________________  Time seen: Approximately 2:03 PM  I have reviewed the triage vital signs and the nursing notes.   HISTORY  Chief Complaint Chest Pain    HPI [name] is a 83 y.o. male with a history of stable AAA, HTN, DM, paroxysmal A. fib on Xarelto, presenting with chest pain. The patient reports that he ate a large breakfast of oatmeal, and was sitting watching television when he developed a central sharp chest pain that radiated to the left breast associated with some mild shortness of breath. This pain has been persistent since this morning. He has not had any associated diaphoresis, nausea or vomiting. He has not had any change in his chronic lower extremity edema and has not had calf pain. He has no personal or family history of blood clots, but the pain is worse with deep breaths. Also worse with positional changes.    Past Medical History:  Diagnosis Date  . Diabetes ([place])   . Hearing loss   . High blood pressure   . History of bladder problems   . Kidney stones 11/22/2014    Patient Active Problem List   Diagnosis Date Noted  . Benign essential HTN 07/06/2014  . Cough with expectoration 03/21/2014  . Obstructive apnea 03/21/2014  . AF (paroxysmal atrial fibrillation) ([place]) 03/21/2014  . H/O adenomatous polyp of colon 06/28/2013  . Hepatic cyst 06/28/2013  . Chronic kidney disease 06/08/2013  . Diabetes ([place]) 06/08/2013  . Combined fat and carbohydrate induced hyperlipemia 06/08/2013  . Heart valve disease 06/08/2013    Past Surgical History:  Procedure Laterality Date  . PROSTATE CANCER    . PROSTATE SURGERY      Current Outpatient Rx  . Order #: [id number] Class: Historical Med  . Order #: [id number] Class: Historical Med  . Order #: [id number] Class: Historical Med    Allergies Patient has no active allergies.  Family History  Problem Relation  Age of Onset  . Cancer Mother   . Diabetes    . High blood pressure    . Prostate cancer Brother     Social History Social History  Substance Use Topics  . Smoking status: Former Smoker    Years: 16.00    Types: Cigarettes  . Smokeless tobacco: Never Used  . Alcohol use No    Review of Systems Constitutional: No fever/chills.No lightheadedness or syncope. Eyes: No visual changes. ENT: No sore throat. No congestion or rhinorrhea. Cardiovascular: Positive chest pain. Denies palpitations. Respiratory: Positive shortness of breath.  No cough. Gastrointestinal: No abdominal pain.  No nausea, no vomiting.  No diarrhea.  No constipation. Genitourinary: Negative for dysuria. Musculoskeletal: Negative for back pain. Positive unchanged lower extremity edema. Skin: Negative for rash. Neurological: Negative for headaches. No focal numbness, tingling or weakness.   10-point ROS otherwise negative.  ____________________________________________   PHYSICAL EXAM:  VITAL SIGNS: ED Triage Vitals  Enc Vitals Group     BP 05/04/16 1343 (!) 169/79     Pulse Rate 05/04/16 1343 70     Resp 05/04/16 1343 20     Temp 05/04/16 1343 98.1 F (36.7 C)     Temp src --      SpO2 05/04/16 1343 97 %     Weight 05/04/16 1344 262 lb (118.8 kg)     Height 05/04/16 1344 6' (1.829 m)     Head Circumference --      Peak Flow --  Pain Score 05/04/16 1339 5     Pain Loc --      Pain Edu? --      Excl. in [place]? --     Constitutional: Alert and oriented. Well appearing and in no acute distress. Answers questions appropriately. Eyes: Conjunctivae are normal.  EOMI. No scleral icterus. Head: Atraumatic. Nose: No congestion/rhinnorhea. Mouth/Throat: Mucous membranes are moist.  Neck: No stridor.  Supple.  No JVD. Cardiovascular: Normal rate, regular rhythm. No murmurs, rubs or gallops.  Respiratory: Normal respiratory effort.  No accessory muscle use or retractions. Lungs CTAB.  No wheezes, rales  or ronchi. Gastrointestinal: Soft, nontender and mildly distended.  No guarding or rebound.  No peritoneal signs. Musculoskeletal: Bilateral right greater than left LE edema. No ttp in the calves or palpable cords.  Negative Homan's sign. Neurologic:  A&Ox3.  Speech is clear.  Face and smile are symmetric.  EOMI.  Moves all extremities well. Skin:  Skin is warm, dry and intact. No rash noted. Psychiatric: Mood and affect are normal. Speech and behavior are normal.  Normal judgement.  ____________________________________________   LABS (all labs ordered are listed, but only abnormal results are displayed)  Labs Reviewed  BASIC METABOLIC PANEL - Abnormal; Notable for the following:       Result Value   Creatinine, Ser 1.44 (*)    Calcium 8.7 (*)    GFR calc non Af Amer 44 (*)    GFR calc Af Amer 51 (*)    All other components within normal limits  CBC - Abnormal; Notable for the following:    RDW 15.9 (*)    All other components within normal limits  TROPONIN I - Abnormal; Notable for the following:    Troponin I 0.03 (*)    All other components within normal limits  BRAIN NATRIURETIC PEPTIDE - Abnormal; Notable for the following:    B Natriuretic Peptide 211.0 (*)    All other components within normal limits  TROPONIN I   ____________________________________________  EKG  ED ECG REPORT I, Eula Listen, the attending physician, personally viewed and interpreted this ECG.   Date: 05/04/2016  EKG Time: 1340  Rate: 80  Rhythm: normal sinus rhythm  Axis: leftward  Intervals:first-degree A-V block   ST&T Change: no STEMI  ____________________________________________  RADIOLOGY  Dg Chest 2 View  Result Date: 05/04/2016 CLINICAL DATA:  Left-sided chest pain EXAM: CHEST  2 VIEW COMPARISON:  01/21/2015 FINDINGS: Mild cardiomegaly. Vasculature is within normal limits. Low volumes and bibasilar atelectasis. No pneumothorax. No pleural effusion. IMPRESSION: Bibasilar  atelectasis.  Cardiomegaly. Electronically Signed   By: Marybelle Killings M.D.   On: 05/04/2016 14:45   Ct Angio Chest/abd/pel For Dissection W And/or W/wo  Result Date: 05/04/2016 CLINICAL DATA:  Pt awoke this am with CP, hx of htn and dm, 75 ml isovue 370 given,. Concern for aortic dissection EXAM: CT ANGIOGRAPHY CHEST, ABDOMEN AND PELVIS TECHNIQUE: Multidetector CT imaging through the chest, abdomen and pelvis was performed using the standard protocol during bolus administration of intravenous contrast. Multiplanar reconstructed images and MIPs were obtained and reviewed to evaluate the vascular anatomy. CONTRAST:  75 mL Isovue COMPARISON:  CT abdomen 3/8/6 FINDINGS: CTA CHEST FINDINGS Cardiovascular: Non IV contrast images demonstrate no intramural hematoma within the thoracic aorta. Contrast enhanced images demonstrate no dissection or aneurysm of the ascending, transverse, descending thoracic aorta. Great vessels are normal. Bovine arch anatomy. Mediastinum/Nodes: No axillary or supraclavicular adenopathy. No mediastinal hilar adenopathy. Lungs/Pleura: No pulmonary edema. No  infarction. No pneumonia or pneumothorax Musculoskeletal: No aggressive osseous lesion Review of the MIP images confirms the above findings. CTA ABDOMEN AND PELVIS FINDINGS VASCULAR Aorta: Normal in caliber.  No dissection. Celiac: patent SMA: Widely patent Renals: Single renal arteries. IMA: Patent Inflow: No aneurysm normal Veins: Unremarkable Review of the MIP images confirms the above findings. NON-VASCULAR Hepatobiliary: Several hepatic cysts. Pancreas: No inflammation.  Normal pancreatic duct Spleen: Normal spleen Adrenals/Urinary Tract: Adrenal glands normal. Bilateral renal cortical thinning. Multiple nonobstructing calculi within the kidneys. No ureterolithiasis or obstructive uropathy. Stomach/Bowel: Stomach, small-bowel, appendix and colon are normal. Lymphatic: No lymphadenopathy Reproductive: Post prostatectomy Other: No  abdominal hernia Musculoskeletal: No aggressive osseous lesion Review of the MIP images confirms the above findings. IMPRESSION: Chest Impression: 1. No evidence of aortic dissection or aneurysm. Abdomen / Pelvis Impression: 1. No evidence of aortic dissection or aneurysm. 2.  Atherosclerotic calcification of the aorta (ICD10-170.0). 3. Bilateral renal cortical thinning Electronically Signed   By: Suzy Bouchard M.D.   On: 05/04/2016 16:48    ____________________________________________   PROCEDURES  Procedure(s) performed: None  Procedures  Critical Care performed: No ____________________________________________   INITIAL IMPRESSION / ASSESSMENT AND PLAN / ED COURSE  Pertinent labs & imaging results that were available during my care of the patient were reviewed by me and considered in my medical decision making (see chart for details).  83 y.o. male with a known stable AAA, retention, hyperlipidemia, DM, presenting with chest pain after eating. Overall, the patient has mild hypertension but is otherwise hemodynamically stable. His EKG does not have any ischemic changes but will get a troponin. I will also get a CT scan to evaluate for PE and dissection given his risk factors and symptoms today. Will plan reevaluation for final disposition.  ----------------------------------------- 4:53 PM on 05/04/2016 -----------------------------------------  The patient continues to be hemodynamically stable and chest pain-free. His troponin is 0.03 and he will be given aspirin and admitted to trend this. He also has a BNP of 211. His CT of the chest does not show PE or dissection. Plan admission at this time  ____________________________________________  FINAL CLINICAL IMPRESSION(S) / ED DIAGNOSES  Final diagnoses:  Elevated troponin  Chest pain, unspecified type  Shortness of breath  Acute congestive heart failure, unspecified heart failure type ([place])         NEW MEDICATIONS  STARTED DURING THIS VISIT:  New Prescriptions   No medications on file      Eula Listen, MD 05/04/16 1654

## 2016-05-04 NOTE — ED Notes (Signed)
Pt transported to CT by Allison, CT tech 

## 2016-05-04 NOTE — ED Triage Notes (Signed)
Pt states awoke with chest pain - hard to get answers. Family answering most questions. Wincing in pain

## 2016-05-04 NOTE — H&P (Signed)
History and Physical    [name] FMB:[id number] DOB: 1933-01-24 DOA: 05/04/2016  Referring physician: Dr. Mariea Clonts PCP: Lavera Guise, MD  Specialists: Dr. [name]  Chief Complaint: chest pain with SOB  HPI: [name] is a 83 y.o. male has a past medical history significant for HTN, DM, and CKD as well as PAF now with 2-3 hour hx of CP with SOB. Pain was sharp and non-radiating. Currently pain-free but still SOB. Troponin in ER mildly elevated. Na acute EKG changes. CT negative. He is now admitted. No fever. Denies N/V/D. Some dyspnea and LE edema  Review of Systems: The patient denies anorexia, fever, weight loss,, vision loss, decreased hearing, hoarseness,  syncope, balance deficits, hemoptysis, abdominal pain, melena, hematochezia, severe indigestion/heartburn, hematuria, incontinence, genital sores, muscle weakness, suspicious skin lesions, transient blindness, difficulty walking, depression, unusual weight change, abnormal bleeding, enlarged lymph nodes, angioedema, and breast masses.   Past Medical History:  Diagnosis Date  . Diabetes ([place])   . Hearing loss   . High blood pressure   . History of bladder problems   . Kidney stones 11/22/2014   Past Surgical History:  Procedure Laterality Date  . PROSTATE CANCER    . PROSTATE SURGERY     Social History:  reports that he has quit smoking. His smoking use included Cigarettes. He quit after 16.00 years of use. He has never used smokeless tobacco. He reports that he does not drink alcohol or use drugs.  No Active Allergies  Family History  Problem Relation Age of Onset  . Cancer Mother   . Diabetes    . High blood pressure    . Prostate cancer Brother     Prior to Admission medications   Medication Sig Start Date End Date Taking? Authorizing Provider  CARTIA XT 120 MG 24 hr capsule Take 120 mg by mouth daily. 04/22/16  Yes Historical Provider, MD  glipiZIDE (GLUCOTROL) 10 MG tablet Take 10 mg by mouth 2 (two)  times daily before a meal.   Yes Historical Provider, MD  PRADAXA 150 MG CAPS capsule Take 150 mg by mouth 2 (two) times daily. 04/05/16  Yes Historical Provider, MD   Physical Exam: Vitals:   05/04/16 1344 05/04/16 1400 05/04/16 1500 05/04/16 1530  BP:  (!) 160/76 134/74 (!) 146/84  Pulse:  80 68 (!) 33  Resp:  (!) 23 (!) 24 14  Temp:      SpO2:  96% 94% 95%  Weight: 118.8 kg (262 lb)     Height: 6' (1.829 m)        General:  No apparent distress, WDWN, [place]/AT  Eyes: PERRL, EOMI, no scleral icterus, conjunctiva clear  ENT: moist oropharynx without exudate, TM's benign, dentition fair  Neck: supple, no lymphadenopathy. No bruits or thyromegaly  Cardiovascular: regular rate without MRG; 2+ peripheral pulses, no JVD, 1+ peripheral edema  Respiratory: CTA biL, good air movement without wheezing, rhonchi or crackled. Respiratory effort normal  Abdomen: soft, non tender to palpation, positive bowel sounds, no guarding, no rebound  Skin: no rashes or lesions  Musculoskeletal: normal bulk and tone, no joint swelling  Psychiatric: normal mood and affect, A&OX3  Neurologic: CN 2-12 grossly intact, Motor strength 5/5 in all 4 groups with symmetric DTR's and non-focal sensory exam  Labs on Admission:  Basic Metabolic Panel:  Recent Labs Lab 05/04/16 1420  NA 140  K 4.4  CL 108  CO2 27  GLUCOSE 97  BUN 20  CREATININE 1.44*  CALCIUM 8.7*   Liver Function Tests: No results for input(s): AST, ALT, ALKPHOS, BILITOT, PROT, ALBUMIN in the last 168 hours. No results for input(s): LIPASE, AMYLASE in the last 168 hours. No results for input(s): AMMONIA in the last 168 hours. CBC:  Recent Labs Lab 05/04/16 1420  WBC 6.1  HGB 13.6  HCT 42.3  MCV 89.6  PLT 163   Cardiac Enzymes:  Recent Labs Lab 05/04/16 1420  TROPONINI 0.03*    BNP (last 3 results)  Recent Labs  05/04/16 1430  BNP 211.0*    ProBNP (last 3 results) No results for input(s): PROBNP in the last  8760 hours.  CBG: No results for input(s): GLUCAP in the last 168 hours.  Radiological Exams on Admission: Dg Chest 2 View  Result Date: 05/04/2016 CLINICAL DATA:  Left-sided chest pain EXAM: CHEST  2 VIEW COMPARISON:  01/21/2015 FINDINGS: Mild cardiomegaly. Vasculature is within normal limits. Low volumes and bibasilar atelectasis. No pneumothorax. No pleural effusion. IMPRESSION: Bibasilar atelectasis.  Cardiomegaly. Electronically Signed   By: Marybelle Killings M.D.   On: 05/04/2016 14:45   Ct Angio Chest/abd/pel For Dissection W And/or W/wo  Result Date: 05/04/2016 CLINICAL DATA:  Pt awoke this am with CP, hx of htn and dm, 75 ml isovue 370 given,. Concern for aortic dissection EXAM: CT ANGIOGRAPHY CHEST, ABDOMEN AND PELVIS TECHNIQUE: Multidetector CT imaging through the chest, abdomen and pelvis was performed using the standard protocol during bolus administration of intravenous contrast. Multiplanar reconstructed images and MIPs were obtained and reviewed to evaluate the vascular anatomy. CONTRAST:  75 mL Isovue COMPARISON:  CT abdomen 3/8/6 FINDINGS: CTA CHEST FINDINGS Cardiovascular: Non IV contrast images demonstrate no intramural hematoma within the thoracic aorta. Contrast enhanced images demonstrate no dissection or aneurysm of the ascending, transverse, descending thoracic aorta. Great vessels are normal. Bovine arch anatomy. Mediastinum/Nodes: No axillary or supraclavicular adenopathy. No mediastinal hilar adenopathy. Lungs/Pleura: No pulmonary edema. No infarction. No pneumonia or pneumothorax Musculoskeletal: No aggressive osseous lesion Review of the MIP images confirms the above findings. CTA ABDOMEN AND PELVIS FINDINGS VASCULAR Aorta: Normal in caliber.  No dissection. Celiac: patent SMA: Widely patent Renals: Single renal arteries. IMA: Patent Inflow: No aneurysm normal Veins: Unremarkable Review of the MIP images confirms the above findings. NON-VASCULAR Hepatobiliary: Several hepatic  cysts. Pancreas: No inflammation.  Normal pancreatic duct Spleen: Normal spleen Adrenals/Urinary Tract: Adrenal glands normal. Bilateral renal cortical thinning. Multiple nonobstructing calculi within the kidneys. No ureterolithiasis or obstructive uropathy. Stomach/Bowel: Stomach, small-bowel, appendix and colon are normal. Lymphatic: No lymphadenopathy Reproductive: Post prostatectomy Other: No abdominal hernia Musculoskeletal: No aggressive osseous lesion Review of the MIP images confirms the above findings. IMPRESSION: Chest Impression: 1. No evidence of aortic dissection or aneurysm. Abdomen / Pelvis Impression: 1. No evidence of aortic dissection or aneurysm. 2.  Atherosclerotic calcification of the aorta (ICD10-170.0). 3. Bilateral renal cortical thinning Electronically Signed   By: Suzy Bouchard M.D.   On: 05/04/2016 16:48    EKG: Independently reviewed.  Assessment/Plan Principal Problem:   Chest pain Active Problems:   Chronic kidney disease   AF (paroxysmal atrial fibrillation) (HCC)   Elevated troponin   Will observe on telemetry and follow enzymes. Follow sugars. Consult Cardiology. Continue outpatient meds. IV morphine and SL NTG as needed  Diet: carb modified, low salt Fluids: NS@75  DVT Prophylaxis: Pradaxa  Code Status: FULL  Family Communication: yes  Disposition Plan: home  Time spent: 50 min

## 2016-05-05 DIAGNOSIS — R748 Abnormal levels of other serum enzymes: Secondary | ICD-10-CM | POA: Diagnosis not present

## 2016-05-05 DIAGNOSIS — N183 Chronic kidney disease, stage 3 (moderate): Secondary | ICD-10-CM | POA: Diagnosis not present

## 2016-05-05 DIAGNOSIS — I480 Paroxysmal atrial fibrillation: Secondary | ICD-10-CM | POA: Diagnosis not present

## 2016-05-05 DIAGNOSIS — R0602 Shortness of breath: Secondary | ICD-10-CM | POA: Diagnosis not present

## 2016-05-05 DIAGNOSIS — R079 Chest pain, unspecified: Secondary | ICD-10-CM | POA: Diagnosis not present

## 2016-05-05 LAB — COMPREHENSIVE METABOLIC PANEL
ALT: 25 U/L (ref 17–63)
AST: 25 U/L (ref 15–41)
Albumin: 3.3 g/dL — ABNORMAL LOW (ref 3.5–5.0)
Alkaline Phosphatase: 99 U/L (ref 38–126)
Anion gap: 4 — ABNORMAL LOW (ref 5–15)
BUN: 17 mg/dL (ref 6–20)
CO2: 26 mmol/L (ref 22–32)
Calcium: 8.6 mg/dL — ABNORMAL LOW (ref 8.9–10.3)
Chloride: 105 mmol/L (ref 101–111)
Creatinine, Ser: 1.49 mg/dL — ABNORMAL HIGH (ref 0.61–1.24)
GFR calc Af Amer: 49 mL/min — ABNORMAL LOW (ref >60)
GFR calc non Af Amer: 42 mL/min — ABNORMAL LOW (ref >60)
Glucose, Bld: 127 mg/dL — ABNORMAL HIGH (ref 65–99)
Potassium: 4.5 mmol/L (ref 3.5–5.1)
Sodium: 135 mmol/L (ref 135–145)
Total Bilirubin: 0.6 mg/dL (ref 0.3–1.2)
Total Protein: 7.3 g/dL (ref 6.5–8.1)

## 2016-05-05 LAB — CBC
HCT: 42.9 % (ref 40.0–52.0)
Hemoglobin: 13.8 g/dL (ref 13.0–18.0)
MCH: 28.4 pg (ref 26.0–34.0)
MCHC: 32.2 g/dL (ref 32.0–36.0)
MCV: 88.1 fL (ref 80.0–100.0)
Platelets: 169 10*3/uL (ref 150–440)
RBC: 4.87 MIL/uL (ref 4.40–5.90)
RDW: 15.7 % — ABNORMAL HIGH (ref 11.5–14.5)
WBC: 6.3 10*3/uL (ref 3.8–10.6)

## 2016-05-05 LAB — TROPONIN I
Troponin I: 0.03 ng/mL (ref <0.03)
Troponin I: 0.03 ng/mL (ref <0.03)

## 2016-05-05 LAB — GLUCOSE, CAPILLARY
Glucose-Capillary: 127 mg/dL — ABNORMAL HIGH (ref 65–99)
Glucose-Capillary: 107 mg/dL — ABNORMAL HIGH (ref 65–99)

## 2016-05-05 MED ORDER — ASPIRIN 81 MG PO TBEC: 81.0000 mg | DELAYED_RELEASE_TABLET | Freq: Every day | ORAL | 3 refills | Status: DC

## 2016-05-05 MED ORDER — NITROGLYCERIN 0.4 MG SL SUBL: 0.4000 mg | SUBLINGUAL_TABLET | SUBLINGUAL | 12 refills | Status: DC | PRN

## 2016-05-05 MED ORDER — FAMOTIDINE 20 MG PO TABS: 20.0000 mg | ORAL_TABLET | Freq: Two times a day (BID) | ORAL | Status: DC

## 2016-05-05 NOTE — Progress Notes (Signed)
        To Whom It May Concern:     [name] was hospitalized at [facility] 05/04/2016 through 05/05/2016. He is to return back to work at full capacity 05/11/2016. Thank you for your understanding.      Sincerely,  Theodoro Grist, MD

## 2016-05-05 NOTE — Plan of Care (Signed)
Problem: Cardiac: Goal: Ability to achieve and maintain adequate cardiovascular perfusion will improve Outcome: Progressing Pain free on arrival to floor.  Instructed to call nurse with chest pain episodes.  Understanding voiced.

## 2016-05-05 NOTE — Progress Notes (Signed)
IV Famotidine changed to PO per protocol as conditions are met: Regular diet and taking other PO meds for 24 hr.

## 2016-05-05 NOTE — Progress Notes (Signed)
Discharge paperwork reviewed with patient. Information regarding follow-up appointments, medications and education for all newly prescribed meds was provided, all questions answered. Peripheral IV removed, catheter intact. Heart monitor removed, returned to the nurse station. Transport requested via wheelchair to the lobby for discharge.    

## 2016-05-05 NOTE — Consult Note (Signed)
Reason for Consult: Chest pain SOB [name] Referring Physician: Clayborn Bigness, primary , Dr. Georgie Chard hospitalist Cardiologist Dr. Margarita Rana is an 83 y.o. male.  HPI: Patient presented with complaints of recent mild chest pain he has a known history of hypertension diabetes chronic renal insufficiency paroxysmal [name] on anticoagulation. Patient complains of a sharp atypical non-radiating pain to the left side still with some shortness of breath patient's had some mild dyspnea with leg edema. Patient denies palpitations or tachycardia. No clear evaluation and possible obstructive sleep apnea. Appears to be pain-free now and doing reasonably well enzymes are negative. Patient reportedly had preoperative assessment about a year ago prior to cataract surgery Consider further evaluation could possibly be done as an outpatient  Past Medical History:  Diagnosis Date  . Diabetes ([place])   . Hearing loss   . High blood pressure   . History of bladder problems   . Kidney stones 11/22/2014    Past Surgical History:  Procedure Laterality Date  . PROSTATE CANCER    . PROSTATE SURGERY      Family History  Problem Relation Age of Onset  . Cancer Mother   . Diabetes    . High blood pressure    . Prostate cancer Brother     Social History:  reports that he has quit smoking. His smoking use included Cigarettes. He quit after 16.00 years of use. He has never used smokeless tobacco. He reports that he does not drink alcohol or use drugs.  Allergies: No Active Allergies  Medications: I have reviewed the patient's current medications.  Results for orders placed or performed during the hospital encounter of 05/04/16 (from the past 48 hour(s))  Basic metabolic panel     Status: Abnormal   Collection Time: 05/04/16  2:20 PM  Result Value Ref Range   Sodium 140 135 - 145 mmol/L   Potassium 4.4 3.5 - 5.1 mmol/L   Chloride 108 101 - 111 mmol/L   CO2 27 22 - 32 mmol/L   Glucose, Bld 97  65 - 99 mg/dL   BUN 20 6 - 20 mg/dL   Creatinine, Ser 1.44 (H) 0.61 - 1.24 mg/dL   Calcium 8.7 (L) 8.9 - 10.3 mg/dL   GFR calc non Af Amer 44 (L) >60 mL/min   GFR calc Af Amer 51 (L) >60 mL/min    Comment: (NOTE) The eGFR has been calculated using the CKD EPI equation. This calculation has not been validated in all clinical situations. eGFR's persistently <60 mL/min signify possible Chronic Kidney Disease.    Anion gap 5 5 - 15  CBC     Status: Abnormal   Collection Time: 05/04/16  2:20 PM  Result Value Ref Range   WBC 6.1 3.8 - 10.6 K/uL   RBC 4.72 4.40 - 5.90 MIL/uL   Hemoglobin 13.6 13.0 - 18.0 g/dL   HCT 42.3 40.0 - 52.0 %   MCV 89.6 80.0 - 100.0 fL   MCH 28.8 26.0 - 34.0 pg   MCHC 32.2 32.0 - 36.0 g/dL   RDW 15.9 (H) 11.5 - 14.5 %   Platelets 163 150 - 440 K/uL  Troponin I     Status: Abnormal   Collection Time: 05/04/16  2:20 PM  Result Value Ref Range   Troponin I 0.03 (HH) <0.03 ng/mL    Comment: CRITICAL RESULT CALLED TO, READ BACK BY AND VERIFIED WITH ANGELA ROBBINS @ 1510 05/04/16 BY [facility]   Brain natriuretic  peptide     Status: Abnormal   Collection Time: 05/04/16  2:30 PM  Result Value Ref Range   B Natriuretic Peptide 211.0 (H) 0.0 - 100.0 pg/mL  Troponin I     Status: Abnormal   Collection Time: 05/04/16  5:10 PM  Result Value Ref Range   Troponin I 0.03 (HH) <0.03 ng/mL    Comment: CRITICAL VALUE NOTED. VALUE IS CONSISTENT WITH PREVIOUSLY REPORTED/CALLED VALUE MNS  Glucose, capillary     Status: None   Collection Time: 05/04/16  8:59 PM  Result Value Ref Range   Glucose-Capillary 76 65 - 99 mg/dL   Comment 1 Notify RN    Comment 2 Document in Chart   Troponin I     Status: Abnormal   Collection Time: 05/04/16  9:19 PM  Result Value Ref Range   Troponin I 0.03 (HH) <0.03 ng/mL    Comment: CRITICAL VALUE NOTED. VALUE IS CONSISTENT WITH PREVIOUSLY REPORTED/CALLED VALUE RWW   Troponin I     Status: Abnormal   Collection Time: 05/05/16  2:46 AM  Result  Value Ref Range   Troponin I 0.03 (HH) <0.03 ng/mL    Comment: CRITICAL VALUE NOTED. VALUE IS CONSISTENT WITH PREVIOUSLY REPORTED/CALLED VALUE RWW   Glucose, capillary     Status: Abnormal   Collection Time: 05/05/16  7:49 AM  Result Value Ref Range   Glucose-Capillary 127 (H) 65 - 99 mg/dL  CBC     Status: Abnormal   Collection Time: 05/05/16  8:34 AM  Result Value Ref Range   WBC 6.3 3.8 - 10.6 K/uL   RBC 4.87 4.40 - 5.90 MIL/uL   Hemoglobin 13.8 13.0 - 18.0 g/dL   HCT 42.9 40.0 - 52.0 %   MCV 88.1 80.0 - 100.0 fL   MCH 28.4 26.0 - 34.0 pg   MCHC 32.2 32.0 - 36.0 g/dL   RDW 15.7 (H) 11.5 - 14.5 %   Platelets 169 150 - 440 K/uL    Dg Chest 2 View  Result Date: 05/04/2016 CLINICAL DATA:  Left-sided chest pain EXAM: CHEST  2 VIEW COMPARISON:  01/21/2015 FINDINGS: Mild cardiomegaly. Vasculature is within normal limits. Low volumes and bibasilar atelectasis. No pneumothorax. No pleural effusion. IMPRESSION: Bibasilar atelectasis.  Cardiomegaly. Electronically Signed   By: Marybelle Killings M.D.   On: 05/04/2016 14:45   Ct Angio Chest/abd/pel For Dissection W And/or W/wo  Result Date: 05/04/2016 CLINICAL DATA:  Pt awoke this am with CP, hx of htn and dm, 75 ml isovue 370 given,. Concern for aortic dissection EXAM: CT ANGIOGRAPHY CHEST, ABDOMEN AND PELVIS TECHNIQUE: Multidetector CT imaging through the chest, abdomen and pelvis was performed using the standard protocol during bolus administration of intravenous contrast. Multiplanar reconstructed images and MIPs were obtained and reviewed to evaluate the vascular anatomy. CONTRAST:  75 mL Isovue COMPARISON:  CT abdomen 3/8/6 FINDINGS: CTA CHEST FINDINGS Cardiovascular: Non IV contrast images demonstrate no intramural hematoma within the thoracic aorta. Contrast enhanced images demonstrate no dissection or aneurysm of the ascending, transverse, descending thoracic aorta. Great vessels are normal. Bovine arch anatomy. Mediastinum/Nodes: No axillary  or supraclavicular adenopathy. No mediastinal hilar adenopathy. Lungs/Pleura: No pulmonary edema. No infarction. No pneumonia or pneumothorax Musculoskeletal: No aggressive osseous lesion Review of the MIP images confirms the above findings. CTA ABDOMEN AND PELVIS FINDINGS VASCULAR Aorta: Normal in caliber.  No dissection. Celiac: patent SMA: Widely patent Renals: Single renal arteries. IMA: Patent Inflow: No aneurysm normal Veins: Unremarkable Review of the MIP images  confirms the above findings. NON-VASCULAR Hepatobiliary: Several hepatic cysts. Pancreas: No inflammation.  Normal pancreatic duct Spleen: Normal spleen Adrenals/Urinary Tract: Adrenal glands normal. Bilateral renal cortical thinning. Multiple nonobstructing calculi within the kidneys. No ureterolithiasis or obstructive uropathy. Stomach/Bowel: Stomach, small-bowel, appendix and colon are normal. Lymphatic: No lymphadenopathy Reproductive: Post prostatectomy Other: No abdominal hernia Musculoskeletal: No aggressive osseous lesion Review of the MIP images confirms the above findings. IMPRESSION: Chest Impression: 1. No evidence of aortic dissection or aneurysm. Abdomen / Pelvis Impression: 1. No evidence of aortic dissection or aneurysm. 2.  Atherosclerotic calcification of the aorta (ICD10-170.0). 3. Bilateral renal cortical thinning Electronically Signed   By: Suzy Bouchard M.D.   On: 05/04/2016 16:48    Review of Systems  Constitutional: Positive for malaise/fatigue.  HENT: Negative.   Eyes: Negative.   Respiratory: Positive for shortness of breath.   Cardiovascular: Positive for chest pain.  Gastrointestinal: Negative.   Genitourinary: Negative.   Musculoskeletal: Negative.   Skin: Negative.   Neurological: Positive for weakness.  Endo/Heme/Allergies: Negative.   Psychiatric/Behavioral: Negative.    Blood pressure (!) 142/75, pulse 66, temperature 98 F (36.7 C), temperature source Oral, resp. rate 18, height 6' (1.829 m),  weight 117.8 kg (259 lb 12.8 oz), SpO2 97 %. Physical Exam  Nursing note and vitals reviewed. Constitutional: He is oriented to person, place, and time. He appears well-developed and well-nourished.  HENT:  Head: Normocephalic and atraumatic.  Eyes: Conjunctivae and EOM are normal. Pupils are equal, round, and reactive to light.  Neck: Normal range of motion. Neck supple.  Cardiovascular: Normal rate and regular rhythm.   Murmur heard. Respiratory: Effort normal and breath sounds normal.  GI: Soft. Bowel sounds are normal.  Musculoskeletal: Normal range of motion.  Neurological: He is alert and oriented to person, place, and time. He has normal reflexes.  Skin: Skin is warm and dry.  Psychiatric: He has a normal mood and affect.    Assessment/Plan: Chest pain Shortness of breath Paroxysmal atrial fibrillation Mild obesity Diabetes Attention Remote history of smoking but quit Chronic renal insufficiency stage II . Plan Agree with admit to telemetry rule out for myocardial infarction Follow-up troponins and EKGs Continue hypertension management control Agree with Protonix for anticoagulation diltiazem for rate control for paroxysmal [name] Continue glipizide for diabetes type 2 uncomplicated Maintain hydration for her chronic renal insufficiency consider follow-up with nephrology Recommend weight loss exercise portion control Consider sleep study for evaluation of possible obstructive sleep apnea Echocardiogram any helpful for assessment of left ventricular function because of shortness of breath Consider functional study which may be able to be performed as an outpatient Have the patient follow-up with Dr. [name] 1-2 weeks   Fouad Taul D Jaylenn Altier 05/05/2016, 9:40 AM

## 2016-05-05 NOTE — Care Management Obs Status (Signed)
[place] NOTIFICATION   Patient Details  Name: [name] MRN: [id number] Date of Birth: [birth year]   Medicare Observation Status Notification Given:  No (Discharge order in less than 24 hours)    Ival Bible, RN 05/05/2016, 12:37 PM

## 2016-05-06 NOTE — Discharge Summary (Signed)
[name] [place] NAME: [name]    MR#:  [id number]  DATE OF BIRTH:  12/18/1933  DATE OF ADMISSION:  05/04/2016 ADMITTING PHYSICIAN: Idelle Crouch, MD  DATE OF DISCHARGE: 05/05/2016  2:01 PM  PRIMARY CARE PHYSICIAN: Lavera Guise, MD     ADMISSION DIAGNOSIS:  Shortness of breath [R06.02] Elevated troponin [R74.8] Chest pain [R07.9] Chest pain, unspecified type [R07.9] Acute congestive heart failure, unspecified heart failure type ([place]) [I50.9]  DISCHARGE DIAGNOSIS:  Principal Problem:   Chest pain Active Problems:   Elevated troponin   Chronic kidney disease   AF (paroxysmal atrial fibrillation) ([place])   SECONDARY DIAGNOSIS:   Past Medical History:  Diagnosis Date  . Diabetes ([place])   . Hearing loss   . High blood pressure   . History of bladder problems   . Kidney stones 11/22/2014    .pro HOSPITAL COURSE:   [name] is a 83 y.o. male  with past medical history significant for HTN, DM, and CKD as well as PAF now with 2-3 hour hx of CP with SOB. Pain was sharp and non-radiating. Currently pain-free but still SOB. Troponin in ER mildly elevated to 0.03 Na acute EKG changes. CT angiogram of chest , abdomen and pelvis was  Negative for aortic dissection. He was admitted, cardiac enzymes were cycled and he was noted to have no peaks or throughs .He was seen by cardiologist, dr Clayborn Bigness, who recommended ambulate the patient in the hallways, initiate him on asa, nitroglycerine SL prn, follow up with primary cardiologist DR [name] in few days for further testing, if needed.  The patient ambulated in hospital, had no recurrent chest pain, his oxygen saturations remained stable at 97%. He was felt to be stable to be discharged home. Discussion by problem: 1. Chest pain, unclear ethology at this time, the patient was advised to continue asa, nitro SL prn, follow up with primary cardiologist DR [name] in  few days for further testing, if needed 2. Elevated troponin, likely demand ischemia, no ACS, asa, nitro prn, cardiology follow up 3. ckd, stage 3, stable 4 paroxysmal a. Fib, in SR now, no obvious arrhythmias in the hospital, may need cardiac monitor as outpatient, if not done recently. Echo stress test June 2017, unremarkable  , normal EF, no valvular stenosis, trivial to mild TR, MR respectively  DISCHARGE CONDITIONS:   stable  CONSULTS OBTAINED:  Treatment Team:  Yolonda Kida, MD  DRUG ALLERGIES:  No Active Allergies  DISCHARGE MEDICATIONS:   Discharge Medication List as of 05/05/2016 11:19 AM    START taking these medications   Details  aspirin EC 81 MG EC tablet Take 1 tablet (81 mg total) by mouth daily., Starting Mon 05/06/2016, Normal    nitroGLYCERIN (NITROSTAT) 0.4 MG SL tablet Place 1 tablet (0.4 mg total) under the tongue every 5 (five) minutes as needed for chest pain., Starting Sun 05/05/2016, Normal      CONTINUE these medications which have NOT CHANGED   Details  CARTIA XT 120 MG 24 hr capsule Take 120 mg by mouth daily., Starting Mon 04/22/2016, Historical Med    glipiZIDE (GLUCOTROL) 10 MG tablet Take 10 mg by mouth 2 (two) times daily before a meal., Historical Med    PRADAXA 150 MG CAPS capsule Take 150 mg by mouth 2 (two) times daily., Starting Fri 04/05/2016, Historical Med         DISCHARGE INSTRUCTIONS:  Follow up with PC[facility], dr [name]   If you experience worsening of your admission symptoms, develop shortness of breath, life threatening emergency, suicidal or homicidal thoughts you must seek medical attention immediately by calling 911 or calling your MD immediately  if symptoms less severe.  You Must read complete instructions/literature along with all the possible adverse reactions/side effects for all the Medicines you take and that have been prescribed to you. Take any new Medicines after you have completely understood and accept all the  possible adverse reactions/side effects.   Please note  You were cared for by a hospitalist during your hospital stay. If you have any questions about your discharge medications or the care you received while you were in the hospital after you are discharged, you can call the unit and asked to speak with the hospitalist on call if the hospitalist that took care of you is not available. Once you are discharged, your primary care physician will handle any further medical issues. Please note that NO REFILLS for any discharge medications will be authorized once you are discharged, as it is imperative that you return to your primary care physician (or establish a relationship with a primary care physician if you do not have one) for your aftercare needs so that they can reassess your need for medications and monitor your lab values.    Today   CHIEF COMPLAINT:   Chief Complaint  Patient presents with  . Chest Pain    HISTORY OF PRESENT ILLNESS:    VITAL SIGNS:  Blood pressure 120/78, pulse 62, temperature 97.6 F (36.4 C), temperature source Oral, resp. rate 20, height 6' (1.829 m), weight 117.8 kg (259 lb 12.8 oz), SpO2 97 %.  I/O:  No intake or output data in the 24 hours ending 05/06/16 1640  PHYSICAL EXAMINATION:  GENERAL:  83 y.o.-year-old patient lying in the bed with no acute distress.  EYES: Pupils equal, round, reactive to light and accommodation. No scleral icterus. Extraocular muscles intact.  HEENT: Head atraumatic, normocephalic. Oropharynx and nasopharynx clear.  NECK:  Supple, no jugular venous distention. No thyroid enlargement, no tenderness.  LUNGS: Normal breath sounds bilaterally, no wheezing, rales,rhonchi or crepitation. No use of accessory muscles of respiration.  CARDIOVASCULAR: S1, S2 normal. No murmurs, rubs, or gallops.  ABDOMEN: Soft, non-tender, non-distended. Bowel sounds present. No organomegaly or mass.  EXTREMITIES: No pedal edema, cyanosis, or clubbing.   NEUROLOGIC: Cranial nerves II through XII are intact. Muscle strength 5/5 in all extremities. Sensation intact. Gait not checked.  PSYCHIATRIC: The patient is alert and oriented x 3.  SKIN: No obvious rash, lesion, or ulcer.   DATA REVIEW:   CBC  Recent Labs Lab 05/05/16 0834  WBC 6.3  HGB 13.8  HCT 42.9  PLT 169    Chemistries   Recent Labs Lab 05/05/16 0834  NA 135  K 4.5  CL 105  CO2 26  GLUCOSE 127*  BUN 17  CREATININE 1.49*  CALCIUM 8.6*  AST 25  ALT 25  ALKPHOS 99  BILITOT 0.6    Cardiac Enzymes  Recent Labs Lab 05/05/16 0834  TROPONINI 0.03*    Microbiology Results  No results found for this or any previous visit.  RADIOLOGY:  No results found.  EKG:   Orders placed or performed during the hospital encounter of 05/04/16  . EKG 12-Lead  . EKG 12-Lead  . ED EKG within 10 minutes  . ED EKG within 10 minutes  . EKG  Management plans discussed with the patient, family and they are in agreement.  CODE STATUS:  Code Status History    Date Active Date Inactive Code Status Order ID Comments User Context   05/04/2016  8:49 PM 05/05/2016  5:07 PM Full Code [id number]  Idelle Crouch, MD Inpatient      TOTAL TIME TAKING CARE OF THIS PATIENT: 40 minutes.    Theodoro Grist M.D on 05/06/2016 at 4:40 PM  Between 7am to 6pm - Pager - [phone]  After 6pm go to [email or web address] - password [email or web address] [email or web address]  Office  [phone]  CC: Primary care physician; Lavera Guise, MD

## 2016-05-08 DIAGNOSIS — I10 Essential (primary) hypertension: Secondary | ICD-10-CM | POA: Diagnosis not present

## 2016-05-08 DIAGNOSIS — I480 Paroxysmal atrial fibrillation: Secondary | ICD-10-CM | POA: Diagnosis not present

## 2016-05-08 DIAGNOSIS — G4733 Obstructive sleep apnea (adult) (pediatric): Secondary | ICD-10-CM | POA: Diagnosis not present

## 2016-05-08 DIAGNOSIS — I38 Endocarditis, valve unspecified: Secondary | ICD-10-CM | POA: Diagnosis not present

## 2016-05-08 DIAGNOSIS — E782 Mixed hyperlipidemia: Secondary | ICD-10-CM | POA: Diagnosis not present

## 2016-05-20 DIAGNOSIS — G4733 Obstructive sleep apnea (adult) (pediatric): Secondary | ICD-10-CM | POA: Diagnosis not present

## 2016-06-10 DIAGNOSIS — R079 Chest pain, unspecified: Secondary | ICD-10-CM | POA: Diagnosis not present

## 2016-06-13 DIAGNOSIS — I480 Paroxysmal atrial fibrillation: Secondary | ICD-10-CM | POA: Diagnosis not present

## 2016-06-13 DIAGNOSIS — G4733 Obstructive sleep apnea (adult) (pediatric): Secondary | ICD-10-CM | POA: Diagnosis not present

## 2016-06-13 DIAGNOSIS — I38 Endocarditis, valve unspecified: Secondary | ICD-10-CM | POA: Diagnosis not present

## 2016-06-13 DIAGNOSIS — I10 Essential (primary) hypertension: Secondary | ICD-10-CM | POA: Diagnosis not present

## 2016-06-13 DIAGNOSIS — M25552 Pain in left hip: Secondary | ICD-10-CM | POA: Diagnosis not present

## 2016-06-19 DIAGNOSIS — G4733 Obstructive sleep apnea (adult) (pediatric): Secondary | ICD-10-CM | POA: Diagnosis not present

## 2016-06-21 ENCOUNTER — Other Ambulatory Visit (INDEPENDENT_AMBULATORY_CARE_PROVIDER_SITE_OTHER): Payer: Medicare Other

## 2016-06-27 ENCOUNTER — Other Ambulatory Visit (INDEPENDENT_AMBULATORY_CARE_PROVIDER_SITE_OTHER): Payer: Medicare Other

## 2016-06-27 ENCOUNTER — Ambulatory Visit (INDEPENDENT_AMBULATORY_CARE_PROVIDER_SITE_OTHER): Payer: Medicare Other | Admitting: Vascular Surgery

## 2016-07-08 DIAGNOSIS — S61204A Unspecified open wound of right ring finger without damage to nail, initial encounter: Secondary | ICD-10-CM | POA: Diagnosis not present

## 2016-07-15 ENCOUNTER — Ambulatory Visit: Payer: Medicare Other | Admitting: Podiatry

## 2016-07-20 DIAGNOSIS — G4733 Obstructive sleep apnea (adult) (pediatric): Secondary | ICD-10-CM | POA: Diagnosis not present

## 2016-08-08 ENCOUNTER — Other Ambulatory Visit (INDEPENDENT_AMBULATORY_CARE_PROVIDER_SITE_OTHER): Payer: Self-pay | Admitting: Vascular Surgery

## 2016-08-08 DIAGNOSIS — I700 Atherosclerosis of aorta: Secondary | ICD-10-CM

## 2016-08-09 ENCOUNTER — Ambulatory Visit (INDEPENDENT_AMBULATORY_CARE_PROVIDER_SITE_OTHER): Payer: Medicare Other

## 2016-08-09 DIAGNOSIS — I700 Atherosclerosis of aorta: Secondary | ICD-10-CM | POA: Diagnosis not present

## 2016-08-16 ENCOUNTER — Encounter (INDEPENDENT_AMBULATORY_CARE_PROVIDER_SITE_OTHER): Payer: Self-pay | Admitting: Vascular Surgery

## 2016-08-19 ENCOUNTER — Encounter: Payer: Self-pay | Admitting: Podiatry

## 2016-08-19 ENCOUNTER — Ambulatory Visit (INDEPENDENT_AMBULATORY_CARE_PROVIDER_SITE_OTHER): Payer: Medicare Other | Admitting: Podiatry

## 2016-08-19 ENCOUNTER — Emergency Department
Admission: EM | Admit: 2016-08-19 | Discharge: 2016-08-19 | Disposition: A | Discharge status: Home / Self Care | Payer: Medicare Other | Attending: Emergency Medicine | Admitting: Emergency Medicine

## 2016-08-19 ENCOUNTER — Encounter: Payer: Self-pay | Admitting: Emergency Medicine

## 2016-08-19 ENCOUNTER — Emergency Department: Payer: Medicare Other

## 2016-08-19 DIAGNOSIS — R110 Nausea: Secondary | ICD-10-CM | POA: Insufficient documentation

## 2016-08-19 DIAGNOSIS — I719 Aortic aneurysm of unspecified site, without rupture: Secondary | ICD-10-CM

## 2016-08-19 DIAGNOSIS — Z8546 Personal history of malignant neoplasm of prostate: Secondary | ICD-10-CM | POA: Insufficient documentation

## 2016-08-19 DIAGNOSIS — I714 Abdominal aortic aneurysm, without rupture: Secondary | ICD-10-CM | POA: Insufficient documentation

## 2016-08-19 DIAGNOSIS — N179 Acute kidney failure, unspecified: Secondary | ICD-10-CM | POA: Insufficient documentation

## 2016-08-19 DIAGNOSIS — B351 Tinea unguium: Secondary | ICD-10-CM | POA: Diagnosis not present

## 2016-08-19 DIAGNOSIS — R1030 Lower abdominal pain, unspecified: Secondary | ICD-10-CM | POA: Diagnosis present

## 2016-08-19 DIAGNOSIS — E119 Type 2 diabetes mellitus without complications: Secondary | ICD-10-CM

## 2016-08-19 DIAGNOSIS — Z79899 Other long term (current) drug therapy: Secondary | ICD-10-CM | POA: Diagnosis not present

## 2016-08-19 DIAGNOSIS — Z7982 Long term (current) use of aspirin: Secondary | ICD-10-CM | POA: Diagnosis not present

## 2016-08-19 DIAGNOSIS — M79676 Pain in unspecified toe(s): Secondary | ICD-10-CM

## 2016-08-19 DIAGNOSIS — K5732 Diverticulitis of large intestine without perforation or abscess without bleeding: Secondary | ICD-10-CM | POA: Insufficient documentation

## 2016-08-19 DIAGNOSIS — G4733 Obstructive sleep apnea (adult) (pediatric): Secondary | ICD-10-CM | POA: Diagnosis not present

## 2016-08-19 LAB — COMPREHENSIVE METABOLIC PANEL
ALT: 22 U/L (ref 17–63)
AST: 22 U/L (ref 15–41)
Albumin: 3.7 g/dL (ref 3.5–5.0)
Alkaline Phosphatase: 111 U/L (ref 38–126)
Anion gap: 6 (ref 5–15)
BUN: 32 mg/dL — ABNORMAL HIGH (ref 6–20)
CO2: 27 mmol/L (ref 22–32)
Calcium: 9.1 mg/dL (ref 8.9–10.3)
Chloride: 107 mmol/L (ref 101–111)
Creatinine, Ser: 2.05 mg/dL — ABNORMAL HIGH (ref 0.61–1.24)
GFR calc Af Amer: 33 mL/min — ABNORMAL LOW (ref >60)
GFR calc non Af Amer: 29 mL/min — ABNORMAL LOW (ref >60)
Glucose, Bld: 98 mg/dL (ref 65–99)
Potassium: 4.6 mmol/L (ref 3.5–5.1)
Sodium: 140 mmol/L (ref 135–145)
Total Bilirubin: 0.4 mg/dL (ref 0.3–1.2)
Total Protein: 8 g/dL (ref 6.5–8.1)

## 2016-08-19 LAB — URINALYSIS, COMPLETE (UACMP) WITH MICROSCOPIC
Bacteria, UA: NONE SEEN
Bilirubin Urine: NEGATIVE
Glucose, UA: NEGATIVE mg/dL
Ketones, ur: NEGATIVE mg/dL
Leukocytes, UA: NEGATIVE
Nitrite: NEGATIVE
Protein, ur: NEGATIVE mg/dL
Specific Gravity, Urine: 1.017 (ref 1.005–1.030)
pH: 5 (ref 5.0–8.0)

## 2016-08-19 LAB — CBC
HCT: 41.8 % (ref 40.0–52.0)
Hemoglobin: 14 g/dL (ref 13.0–18.0)
MCH: 30.5 pg (ref 26.0–34.0)
MCHC: 33.6 g/dL (ref 32.0–36.0)
MCV: 90.8 fL (ref 80.0–100.0)
Platelets: 171 10*3/uL (ref 150–440)
RBC: 4.6 MIL/uL (ref 4.40–5.90)
RDW: 15.2 % — ABNORMAL HIGH (ref 11.5–14.5)
WBC: 8.4 10*3/uL (ref 3.8–10.6)

## 2016-08-19 LAB — LIPASE, BLOOD: Lipase: 24 U/L (ref 11–51)

## 2016-08-19 MED ORDER — MORPHINE SULFATE (PF) 4 MG/ML IV SOLN
4.0000 mg | Freq: Once | INTRAVENOUS | Status: AC
  Administered 2016-08-19: 4 mg via INTRAVENOUS
  Filled 2016-08-19: qty 1

## 2016-08-19 MED ORDER — SODIUM CHLORIDE 0.9 % IV BOLUS (SEPSIS): 1000.0000 mL | Freq: Once | INTRAVENOUS | Status: DC

## 2016-08-19 MED ORDER — METRONIDAZOLE 500 MG PO TABS
500.0000 mg | ORAL_TABLET | Freq: Once | ORAL | Status: AC
  Administered 2016-08-19: 500 mg via ORAL
  Filled 2016-08-19: qty 1

## 2016-08-19 MED ORDER — HYDROCODONE-ACETAMINOPHEN 5-325 MG PO TABS
1.0000 | ORAL_TABLET | Freq: Once | ORAL | Status: AC
  Administered 2016-08-19: 1 via ORAL
  Filled 2016-08-19: qty 1

## 2016-08-19 MED ORDER — CIPROFLOXACIN HCL 500 MG PO TABS: 500.0000 mg | ORAL_TABLET | Freq: Two times a day (BID) | ORAL | 0 refills | Status: DC

## 2016-08-19 MED ORDER — CIPROFLOXACIN HCL 250 MG PO TABS: 250.0000 mg | ORAL_TABLET | Freq: Two times a day (BID) | ORAL | 0 refills | Status: DC

## 2016-08-19 MED ORDER — METRONIDAZOLE 500 MG PO TABS: 500.0000 mg | ORAL_TABLET | Freq: Two times a day (BID) | ORAL | 0 refills | Status: DC

## 2016-08-19 MED ORDER — CIPROFLOXACIN HCL 500 MG PO TABS
250.0000 mg | ORAL_TABLET | Freq: Once | ORAL | Status: AC
  Administered 2016-08-19: 250 mg via ORAL
  Filled 2016-08-19: qty 1

## 2016-08-19 MED ORDER — SODIUM CHLORIDE 0.9 % IV BOLUS (SEPSIS)
1000.0000 mL | Freq: Once | INTRAVENOUS | Status: AC
  Administered 2016-08-19: 1000 mL via INTRAVENOUS

## 2016-08-19 MED ORDER — HYDROCODONE-ACETAMINOPHEN 5-325 MG PO TABS: 1.0000 | ORAL_TABLET | Freq: Four times a day (QID) | ORAL | 0 refills | Status: DC | PRN

## 2016-08-19 MED ORDER — IOPAMIDOL (ISOVUE-300) INJECTION 61%
15.0000 mL | INTRAVENOUS | Status: AC
  Administered 2016-08-19: 15 mL via ORAL

## 2016-08-19 MED ORDER — ONDANSETRON HCL 4 MG/2ML IJ SOLN
4.0000 mg | Freq: Once | INTRAMUSCULAR | Status: AC
  Administered 2016-08-19: 4 mg via INTRAVENOUS
  Filled 2016-08-19: qty 2

## 2016-08-19 NOTE — ED Provider Notes (Signed)
[facility] Emergency Department Provider Note ____________________________________________   I have reviewed the triage vital signs and the triage nursing note.  HISTORY  Chief Complaint Abdominal Pain   Historian Patient  HPI [name] is a 83 y.o. male with a history of diabetes, as well as history of prostate cancer with surgery, presents with lower pelvic/abdominal pain since last night. It's been constant and worsening and hurts worse when he presses in and let out. Denies constipation or diarrhea. Reports nausea without any vomiting. No fever. No chest pain or coughing or trouble breathing.  Pain is currently moderate and severe when pressed upon.    Past Medical History:  Diagnosis Date  . Diabetes ([place])   . Hearing loss   . High blood pressure   . History of bladder problems   . Kidney stones 11/22/2014    Patient Active Problem List   Diagnosis Date Noted  . Chest pain 05/04/2016  . Elevated troponin 05/04/2016  . Benign essential hypertension 07/06/2014  . Cough with expectoration 03/21/2014  . OSA (obstructive sleep apnea) 03/21/2014  . Paroxysmal A-fib ([place]) 03/21/2014  . Hx of adenomatous colonic polyps 06/28/2013  . Liver cyst 06/28/2013  . Chronic kidney disease 06/08/2013  . Diabetes ([place]) 06/08/2013  . Hyperlipidemia, mixed 06/08/2013  . Valvular heart disease 06/08/2013    Past Surgical History:  Procedure Laterality Date  . PROSTATE CANCER    . PROSTATE SURGERY      Prior to Admission medications   Medication Sig Start Date End Date Taking? Authorizing Provider  aspirin EC 81 MG EC tablet Take 1 tablet (81 mg total) by mouth daily. 05/06/16  Yes Theodoro Grist, MD  Bromfenac Sodium (PROLENSA) 0.07 % SOLN  07/14/15  Yes [provider]  CARTIA XT 120 MG 24 hr capsule Take 120 mg by mouth daily. 04/22/16  Yes [provider]  Difluprednate (DUREZOL) 0.05 % EMUL  07/14/15  Yes [provider]   glipiZIDE (GLUCOTROL) 10 MG tablet Take 10 mg by mouth 2 (two) times daily before a meal.   Yes [provider]  nitroGLYCERIN (NITROSTAT) 0.4 MG SL tablet Place 1 tablet (0.4 mg total) under the tongue every 5 (five) minutes as needed for chest pain. 05/05/16  Yes Theodoro Grist, MD  PRADAXA 150 MG CAPS capsule Take 150 mg by mouth 2 (two) times daily. 04/05/16  Yes [provider]  ciprofloxacin (CIPRO) 250 MG tablet Take 1 tablet (250 mg total) by mouth 2 (two) times daily. 08/19/16   Lisa Roca, MD  HYDROcodone-acetaminophen (NORCO/VICODIN) 5-325 MG tablet Take 1 tablet by mouth every 6 (six) hours as needed for moderate pain. 08/19/16   Lisa Roca, MD  metroNIDAZOLE (FLAGYL) 500 MG tablet Take 1 tablet (500 mg total) by mouth 2 (two) times daily. 08/19/16   Lisa Roca, MD  [facility] VERIO test strip USE UTD TO CHECK BLOOD SUGAR ONCE D 07/22/16   [provider]    No Known Allergies  Family History  Problem Relation Age of Onset  . Cancer Mother   . Diabetes Unknown   . High blood pressure Unknown   . Prostate cancer Brother     Social History Social History  Substance Use Topics  . Smoking status: Former Smoker    Years: 16.00    Types: Cigarettes  . Smokeless tobacco: Never Used  . Alcohol use No    Review of Systems  Constitutional: Negative for fever. Eyes: Negative for visual changes.  ENT: Negative for sore throat. Cardiovascular: Negative for chest pain. Respiratory: Negative for shortness of breath. Gastrointestinal: Negative for vomiting and diarrhea. Genitourinary: Negative for dysuria. Musculoskeletal: Negative for back pain. Skin: Negative for rash. Neurological: Negative for headache.  ____________________________________________   PHYSICAL EXAM:  VITAL SIGNS: ED Triage Vitals  Enc Vitals Group     BP 08/19/16 1826 126/68     Pulse Rate 08/19/16 1826 81     Resp 08/19/16 1826 18     Temp 08/19/16 1826 98.9 F (37.2  C)     Temp Source 08/19/16 1826 Oral     SpO2 08/19/16 1826 96 %     Weight 08/19/16 1827 262 lb (118.8 kg)     Height 08/19/16 1827 6' (1.829 m)     Head Circumference --      Peak Flow --      Pain Score 08/19/16 1826 7     Pain Loc --      Pain Edu? --      Excl. in [place]? --      Constitutional: Alert and oriented. Well appearing and in no distress. HEENT   Head: Normocephalic and atraumatic.      Eyes: Conjunctivae are normal. Pupils equal and round.       Ears:         Nose: No congestion/rhinnorhea.   Mouth/Throat: Mucous membranes are moist.   Neck: No stridor. Cardiovascular/Chest: Normal rate, regular rhythm.  No murmurs, rubs, or gallops. Respiratory: Normal respiratory effort without tachypnea nor retractions. Breath sounds are clear and equal bilaterally. No wheezes/rales/rhonchi. Gastrointestinal: Soft. Moderate to severe tenderness to palpation in the suprapubic and lower abdominal area with some mild rebound tenderness. Small reducible umbilical hernia that is also a little bit tender. Genitourinary/rectal:Deferred Musculoskeletal: Nontender with normal range of motion in all extremities. No joint effusions.  No lower extremity tenderness.  No edema. Neurologic:  Normal speech and language. No gross or focal neurologic deficits are appreciated. Skin:  Skin is warm, dry and intact. No rash noted. Psychiatric: Mood and affect are normal. Speech and behavior are normal. Patient exhibits appropriate insight and judgment.   ____________________________________________  LABS (pertinent positives/negatives)  Labs Reviewed  COMPREHENSIVE METABOLIC PANEL - Abnormal; Notable for the following:       Result Value   BUN 32 (*)    Creatinine, Ser 2.05 (*)    GFR calc non Af Amer 29 (*)    GFR calc Af Amer 33 (*)    All other components within normal limits  CBC - Abnormal; Notable for the following:    RDW 15.2 (*)    All other components within normal limits   URINALYSIS, COMPLETE (UACMP) WITH MICROSCOPIC - Abnormal; Notable for the following:    Color, Urine YELLOW (*)    APPearance CLEAR (*)    Hgb urine dipstick MODERATE (*)    Squamous Epithelial / LPF 0-5 (*)    All other components within normal limits  LIPASE, BLOOD    ____________________________________________    EKG I, Lisa Roca, MD, the attending physician have personally viewed and interpreted all ECGs.  None ____________________________________________  RADIOLOGY All Xrays were viewed by me. Imaging interpreted by Radiologist.  CT abd and pelvis:  IMPRESSION: 1. Sigmoid diverticulitis without abscess. 2. Colonic diverticulosis. 3. Small kidneys with multiple small, nonobstructing calculi. 4. No significant change in a small infrarenal abdominal aortic aneurysm, measuring 3.1 cm in maximum diameter today.  __________________________________________  PROCEDURES  Procedure(s) performed: None  Critical Care performed: None  ____________________________________________   ED COURSE / ASSESSMENT AND PLAN  Pertinent labs & imaging results that were available during my care of the patient were reviewed by me and considered in my medical decision making (see chart for details).   Patient here with a pelvic/abdominal pain. He is quite tender, with some rebound type tenderness. He has some tenderness at the umbilicus with a small hernia but that part seems to be reproducible. Patient has acute on chronic kidney failure and IV is too risky to give him IV contrast, I will obtain CT imaging with by mouth contrast only. Patient will be given IV hydration as well as pain and nausea medication.  Patient CT scan consistent with diverticulitis without signs of complications.  Discussed with patient and wife regarding kidney dysfunction as well as incidental aortic aneurysm, they follow-up with Dr. Lucky Cowboy.  Patient adequately pain controlled, I think it is okay for  outpatient management.    CONSULTATIONS:   None   Patient / Family / Caregiver informed of clinical course, medical decision-making process, and agree with plan.   I discussed return precautions, follow-up instructions, and discharge instructions with patient and/or family.  Discharge Instructions : Laboratory evaluation for abdominal pain and found to have diverticulitis. You're being treated with antibiotic Cipro and Flagyl.   Return to the emergency department immediately for any worsening or uncontrolled pain, fever, black or bloody stools, or any other symptoms concerning to you.  ___________________________________________   FINAL CLINICAL IMPRESSION(S) / ED DIAGNOSES   Final diagnoses:  AKI (acute kidney injury) ([place])  Aortic aneurysm without rupture, unspecified portion of aorta ([place])  Sigmoid diverticulitis              Note: This dictation was prepared with Dragon dictation. Any transcriptional errors that result from this process are unintentional    Lisa Roca, MD 08/19/16 2240

## 2016-08-19 NOTE — Discharge Instructions (Signed)
Laboratory evaluation for abdominal pain and found to have diverticulitis. You're being treated with antibiotic Cipro and Flagyl.   Return to the emergency department immediately for any worsening or uncontrolled pain, fever, black or bloody stools, or any other symptoms concerning to you.

## 2016-08-19 NOTE — ED Triage Notes (Signed)
States lower abd pain since yesterday. Nausea without vomiting. No diarrhea. Denies fevers.

## 2016-08-19 NOTE — Progress Notes (Signed)
Complaint:  Visit Type: Patient returns to my office for continued preventative foot care services. Complaint: Patient states" my nails have grown long and thick and become painful to walk and wear shoes" Patient has been diagnosed with DM with no foot complications. The patient presents for preventative foot care services. No changes to ROS  Podiatric Exam: Vascular: dorsalis pedis and posterior tibial pulses are palpable bilateral. Capillary return is immediate. Temperature gradient is WNL. Skin turgor WNL  Sensorium: Normal Semmes Weinstein monofilament test. Normal tactile sensation bilaterally. Nail Exam: Pt has thick disfigured discolored nails with subungual debris noted bilateral entire nail hallux through fifth toenails Ulcer Exam: There is no evidence of ulcer or pre-ulcerative changes or infection. Orthopedic Exam: Muscle tone and strength are WNL. No limitations in general ROM. No crepitus or effusions noted. Foot type and digits show no abnormalities. Bony prominences are unremarkable. Skin: No Porokeratosis. No infection or ulcers  Diagnosis:  Onychomycosis, , Pain in right toe, pain in left toes  Treatment & Plan Procedures and Treatment: Consent by patient was obtained for treatment procedures. The patient understood the discussion of treatment and procedures well. All questions were answered thoroughly reviewed. Debridement of mycotic and hypertrophic toenails, 1 through 5 bilateral and clearing of subungual debris. No ulceration, no infection noted.  Return Visit-Office Procedure: Patient instructed to return to the office for a follow up visit 3 months for continued evaluation and treatment.    Windie Marasco DPM 

## 2016-08-19 NOTE — ED Notes (Signed)
Pt back from CT

## 2016-09-19 DIAGNOSIS — G4733 Obstructive sleep apnea (adult) (pediatric): Secondary | ICD-10-CM | POA: Diagnosis not present

## 2016-10-20 DIAGNOSIS — G4733 Obstructive sleep apnea (adult) (pediatric): Secondary | ICD-10-CM | POA: Diagnosis not present

## 2016-11-04 ENCOUNTER — Ambulatory Visit: Payer: Medicare Other | Admitting: Podiatry

## 2016-11-19 DIAGNOSIS — G4733 Obstructive sleep apnea (adult) (pediatric): Secondary | ICD-10-CM | POA: Diagnosis not present

## 2016-12-19 ENCOUNTER — Ambulatory Visit: Payer: Medicare Other | Admitting: Podiatry

## 2016-12-20 DIAGNOSIS — G4733 Obstructive sleep apnea (adult) (pediatric): Secondary | ICD-10-CM | POA: Diagnosis not present

## 2017-01-16 ENCOUNTER — Ambulatory Visit: Payer: Medicare Other | Admitting: Podiatry

## 2017-01-19 DIAGNOSIS — G4733 Obstructive sleep apnea (adult) (pediatric): Secondary | ICD-10-CM | POA: Diagnosis not present

## 2017-02-03 ENCOUNTER — Other Ambulatory Visit: Payer: Self-pay

## 2017-02-03 MED ORDER — ONETOUCH VERIO VI STRP: ORAL_STRIP | 5 refills | Status: DC

## 2017-02-17 ENCOUNTER — Ambulatory Visit: Payer: Medicare Other | Admitting: Podiatry

## 2017-02-17 ENCOUNTER — Encounter: Payer: Self-pay | Admitting: Podiatry

## 2017-02-17 DIAGNOSIS — M79676 Pain in unspecified toe(s): Secondary | ICD-10-CM

## 2017-02-17 DIAGNOSIS — B351 Tinea unguium: Secondary | ICD-10-CM | POA: Diagnosis not present

## 2017-02-17 DIAGNOSIS — E119 Type 2 diabetes mellitus without complications: Secondary | ICD-10-CM

## 2017-02-17 NOTE — Progress Notes (Signed)
Complaint:  Visit Type: Patient returns to my office for continued preventative foot care services. Complaint: Patient states" my nails have grown long and thick and become painful to walk and wear shoes" Patient has been diagnosed with DM with no foot complications. The patient presents for preventative foot care services. No changes to ROS  Podiatric Exam: Vascular: dorsalis pedis and posterior tibial pulses are palpable bilateral. Capillary return is immediate. Temperature gradient is WNL. Skin turgor WNL  Sensorium: Normal Semmes Weinstein monofilament test. Normal tactile sensation bilaterally. Nail Exam: Pt has thick disfigured discolored nails with subungual debris noted bilateral entire nail hallux through fifth toenails Ulcer Exam: There is no evidence of ulcer or pre-ulcerative changes or infection. Orthopedic Exam: Muscle tone and strength are WNL. No limitations in general ROM. No crepitus or effusions noted. Foot type and digits show no abnormalities. Bony prominences are unremarkable. Skin: No Porokeratosis. No infection or ulcers  Diagnosis:  Onychomycosis, , Pain in right toe, pain in left toes  Treatment & Plan Procedures and Treatment: Consent by patient was obtained for treatment procedures. The patient understood the discussion of treatment and procedures well. All questions were answered thoroughly reviewed. Debridement of mycotic and hypertrophic toenails, 1 through 5 bilateral and clearing of subungual debris. No ulceration, no infection noted.  Return Visit-Office Procedure: Patient instructed to return to the office for a follow up visit 3 months for continued evaluation and treatment.    Jaelyn Cloninger DPM 

## 2017-02-19 DIAGNOSIS — G4733 Obstructive sleep apnea (adult) (pediatric): Secondary | ICD-10-CM | POA: Diagnosis not present

## 2017-03-19 ENCOUNTER — Ambulatory Visit (INDEPENDENT_AMBULATORY_CARE_PROVIDER_SITE_OTHER): Payer: Medicare Other

## 2017-03-19 DIAGNOSIS — Z23 Encounter for immunization: Secondary | ICD-10-CM | POA: Diagnosis not present

## 2017-03-20 DIAGNOSIS — G4733 Obstructive sleep apnea (adult) (pediatric): Secondary | ICD-10-CM | POA: Diagnosis not present

## 2017-03-24 ENCOUNTER — Encounter: Payer: Self-pay | Admitting: Nurse Practitioner

## 2017-03-24 ENCOUNTER — Ambulatory Visit: Payer: Medicare Other | Admitting: Nurse Practitioner

## 2017-03-24 VITALS — BP 130/80 | HR 78 | Resp 16 | Ht 72.0 in | Wt 261.0 lb

## 2017-03-24 DIAGNOSIS — E1165 Type 2 diabetes mellitus with hyperglycemia: Secondary | ICD-10-CM | POA: Diagnosis not present

## 2017-03-24 DIAGNOSIS — I10 Essential (primary) hypertension: Secondary | ICD-10-CM | POA: Diagnosis not present

## 2017-03-24 DIAGNOSIS — Z125 Encounter for screening for malignant neoplasm of prostate: Secondary | ICD-10-CM | POA: Diagnosis not present

## 2017-03-24 DIAGNOSIS — I480 Paroxysmal atrial fibrillation: Secondary | ICD-10-CM

## 2017-03-24 DIAGNOSIS — Z0001 Encounter for general adult medical examination with abnormal findings: Secondary | ICD-10-CM | POA: Insufficient documentation

## 2017-03-24 LAB — POCT GLYCOSYLATED HEMOGLOBIN (HGB A1C): Hemoglobin A1C: 7.2

## 2017-03-24 NOTE — Progress Notes (Signed)
[facility] [place], [place] [address]  Internal MEDICINE  Office Visit Note  Patient Name: [name]  [id number]  [id number]  Date of Service: 03/24/2017  Chief Complaint  Patient presents with  . Diabetes    Diabetes  He presents for his follow-up diabetic visit. He has type 2 diabetes mellitus. His disease course has been improving. There are no hypoglycemic associated symptoms. Pertinent negatives for hypoglycemia include no headaches, nervousness/anxiousness or tremors. There are no diabetic associated symptoms. Pertinent negatives for diabetes include no chest pain and no fatigue. There are no hypoglycemic complications. Symptoms are stable. Diabetic complications include heart disease. Risk factors for coronary artery disease include diabetes mellitus, dyslipidemia, hypertension and obesity. Current diabetic treatment includes oral agent (monotherapy). He is compliant with treatment most of the time. His weight is stable. He is following a diabetic diet. Meal planning includes avoidance of concentrated sweets. He has not had a previous visit with a dietitian. He participates in exercise intermittently. His home blood glucose trend is decreasing steadily. He does not see a podiatrist.Eye exam is not current.    Pt is here for routine follow up.    Current Medication: Outpatient Encounter Medications as of 03/24/2017  Medication Sig  . CARTIA XT 120 MG 24 hr capsule Take 120 mg by mouth daily.  Marland Kitchen glipiZIDE (GLUCOTROL) 10 MG tablet Take 10 mg by mouth 2 (two) times daily.  Glory Rosebush VERIO test strip USE UTD TO CHECK BLOOD SUGAR ONCE D  . PRADAXA 150 MG CAPS capsule Take 150 mg by mouth 2 (two) times daily.  . [DISCONTINUED] dabigatran (PRADAXA) 150 MG CAPS capsule TAKE 1 CAPSULE BY MOUTH TWICE DAILY.  . nitroGLYCERIN (NITROSTAT) 0.4 MG SL tablet Place 1 tablet (0.4 mg total) under the tongue every 5 (five) minutes as needed for chest pain. (Patient not  taking: Reported on 03/24/2017)  . [DISCONTINUED] aspirin EC 81 MG EC tablet Take 1 tablet (81 mg total) by mouth daily. (Patient not taking: Reported on 03/24/2017)  . [DISCONTINUED] Bromfenac Sodium (PROLENSA) 0.07 % SOLN   . [DISCONTINUED] Bromfenac Sodium (PROLENSA) 0.07 % SOLN   . [DISCONTINUED] ciprofloxacin (CIPRO) 250 MG tablet Take 1 tablet (250 mg total) by mouth 2 (two) times daily. (Patient not taking: Reported on 03/24/2017)  . [DISCONTINUED] Difluprednate (DUREZOL) 0.05 % EMUL   . [DISCONTINUED] diltiazem (CARTIA XT) 120 MG 24 hr capsule TAKE ONE CAPSULE BY MOUTH EVERY MORNING  . [DISCONTINUED] glimepiride (AMARYL) 1 MG tablet Take by mouth.  . [DISCONTINUED] glipiZIDE (GLUCOTROL) 10 MG tablet Take 10 mg by mouth 2 (two) times daily before a meal.  . [DISCONTINUED] HYDROcodone-acetaminophen (NORCO/VICODIN) 5-325 MG tablet Take 1 tablet by mouth every 6 (six) hours as needed for moderate pain. (Patient not taking: Reported on 03/24/2017)  . [DISCONTINUED] metroNIDAZOLE (FLAGYL) 500 MG tablet Take 1 tablet (500 mg total) by mouth 2 (two) times daily. (Patient not taking: Reported on 03/24/2017)   No facility-administered encounter medications on file as of 03/24/2017.     Surgical History: Past Surgical History:  Procedure Laterality Date  . PROSTATE CANCER    . PROSTATE SURGERY      Medical History: Past Medical History:  Diagnosis Date  . Diabetes ([place])   . Hearing loss   . High blood pressure   . History of bladder problems   . Kidney stones 11/22/2014    Family History: Family History  Problem Relation Age of Onset  . Cancer Mother   .  Diabetes Unknown   . High blood pressure Unknown   . Prostate cancer Brother     Social History   Socioeconomic History  . Marital status: Married    Spouse name: Not on file  . Number of children: Not on file  . Years of education: Not on file  . Highest education level: Not on file  Social Needs  . Financial resource strain: Not  on file  . Food insecurity - worry: Not on file  . Food insecurity - inability: Not on file  . Transportation needs - medical: Not on file  . Transportation needs - non-medical: Not on file  Occupational History  . Not on file  Tobacco Use  . Smoking status: Former Smoker    Years: 16.00    Types: Cigarettes  . Smokeless tobacco: Never Used  Substance and Sexual Activity  . Alcohol use: No  . Drug use: No  . Sexual activity: Not on file  Other Topics Concern  . Not on file  Social History Narrative  . Not on file      Review of Systems  Constitutional: Negative for activity change, chills, fatigue and unexpected weight change.  HENT: Negative for congestion, postnasal drip, rhinorrhea, sneezing and sore throat.   Eyes: Negative.  Negative for redness.  Respiratory: Negative for cough, chest tightness and shortness of breath.   Cardiovascular: Negative for chest pain and palpitations.  Gastrointestinal: Negative for abdominal pain, constipation, diarrhea, nausea and vomiting.  Endocrine:       Blood sugars doing well   Genitourinary: Negative for dysuria and frequency.  Musculoskeletal: Negative for arthralgias, back pain, joint swelling and neck pain.  Skin: Negative for rash.  Neurological: Negative for tremors, numbness and headaches.  Hematological: Negative for adenopathy. Does not bruise/bleed easily.  Psychiatric/Behavioral: Negative for behavioral problems (Depression), sleep disturbance and suicidal ideas. The patient is not nervous/anxious.     Today's Vitals   03/24/17 1444  BP: 130/80  Pulse: 78  Resp: 16  SpO2: 98%  Weight: 261 lb (118.4 kg)  Height: 6' (1.829 m)    Physical Exam  Constitutional: He is oriented to person, place, and time. He appears well-developed and well-nourished. No distress.  HENT:  Head: Normocephalic and atraumatic.  Mouth/Throat: Oropharynx is clear and moist. No oropharyngeal exudate.  Eyes: EOM are normal. Pupils are  equal, round, and reactive to light.  Neck: Normal range of motion. Neck supple. No JVD present. Carotid bruit is not present. No tracheal deviation present. No thyromegaly present.  Cardiovascular: Normal rate, regular rhythm and normal heart sounds. Exam reveals no gallop and no friction rub.  No murmur heard. Pulmonary/Chest: Effort normal and breath sounds normal. No respiratory distress. He has no wheezes. He has no rales. He exhibits no tenderness.  Abdominal: Soft. Bowel sounds are normal. There is no tenderness.  Musculoskeletal: Normal range of motion.  Lymphadenopathy:    He has no cervical adenopathy.  Neurological: He is alert and oriented to person, place, and time. No cranial nerve deficit.  Skin: Skin is warm and dry. He is not diaphoretic.  Psychiatric: He has a normal mood and affect. His behavior is normal. Judgment and thought content normal.  Nursing note and vitals reviewed.  Assessment/Plan:  1. Uncontrolled type 2 diabetes mellitus with hyperglycemia (HCC) - POCT glycosylated hemoglobin (Hb A1C) - 7.2. Continue glipizide as prescribed  - Ambulatory referral to Ophthalmology - CBC with Differential/Platelet - Comprehensive metabolic panel - Lipid panel - TSH  2. Essential hypertension Stable. Continue bp medication as prescribed  - Lipid panel  3. Screening for prostate cancer Check 'psa - PSA - TSH  4. Paroxysmal A-fib (HCC) Stable. Continue cartia and pradaxa as prescribed.   General Counseling: derek laughter understanding of the findings of todays visit and agrees with plan of treatment. I have discussed any further diagnostic evaluation that may be needed or ordered today. We also reviewed his medications today. he has been encouraged to call the office with any questions or concerns that should arise related to todays visit.   This patient was seen by Leretha Pol, FNP- C in Collaboration with Dr Lavera Guise as a part of collaborative care  agreement     Orders Placed This Encounter  Procedures  . PSA  . CBC with Differential/Platelet  . Comprehensive metabolic panel  . Lipid panel  . TSH  . Microalbumin, urine  . Ambulatory referral to Ophthalmology  . POCT glycosylated hemoglobin (Hb A1C)      Time spent: 15 Minutes    Dr Lavera Guise Internal medicine

## 2017-03-31 DIAGNOSIS — E113393 Type 2 diabetes mellitus with moderate nonproliferative diabetic retinopathy without macular edema, bilateral: Secondary | ICD-10-CM | POA: Diagnosis not present

## 2017-04-08 ENCOUNTER — Encounter: Payer: Self-pay | Admitting: Emergency Medicine

## 2017-04-08 ENCOUNTER — Other Ambulatory Visit: Payer: Self-pay

## 2017-04-08 ENCOUNTER — Emergency Department: Payer: Medicare Other

## 2017-04-08 ENCOUNTER — Emergency Department
Admission: EM | Admit: 2017-04-08 | Discharge: 2017-04-08 | Disposition: A | Discharge status: Home / Self Care | Payer: Medicare Other | Attending: Emergency Medicine | Admitting: Emergency Medicine

## 2017-04-08 DIAGNOSIS — R609 Edema, unspecified: Secondary | ICD-10-CM

## 2017-04-08 DIAGNOSIS — R600 Localized edema: Secondary | ICD-10-CM | POA: Diagnosis not present

## 2017-04-08 DIAGNOSIS — M7989 Other specified soft tissue disorders: Secondary | ICD-10-CM

## 2017-04-08 DIAGNOSIS — N189 Chronic kidney disease, unspecified: Secondary | ICD-10-CM | POA: Diagnosis not present

## 2017-04-08 DIAGNOSIS — Z87891 Personal history of nicotine dependence: Secondary | ICD-10-CM | POA: Insufficient documentation

## 2017-04-08 DIAGNOSIS — I129 Hypertensive chronic kidney disease with stage 1 through stage 4 chronic kidney disease, or unspecified chronic kidney disease: Secondary | ICD-10-CM | POA: Diagnosis not present

## 2017-04-08 DIAGNOSIS — R2242 Localized swelling, mass and lump, left lower limb: Secondary | ICD-10-CM | POA: Diagnosis not present

## 2017-04-08 DIAGNOSIS — E119 Type 2 diabetes mellitus without complications: Secondary | ICD-10-CM | POA: Diagnosis not present

## 2017-04-08 DIAGNOSIS — M25562 Pain in left knee: Secondary | ICD-10-CM | POA: Diagnosis not present

## 2017-04-08 DIAGNOSIS — Z8546 Personal history of malignant neoplasm of prostate: Secondary | ICD-10-CM | POA: Insufficient documentation

## 2017-04-08 DIAGNOSIS — M79605 Pain in left leg: Secondary | ICD-10-CM | POA: Diagnosis not present

## 2017-04-08 LAB — BASIC METABOLIC PANEL
Anion gap: 8 (ref 5–15)
BUN: 24 mg/dL — ABNORMAL HIGH (ref 6–20)
CO2: 25 mmol/L (ref 22–32)
Calcium: 8.7 mg/dL — ABNORMAL LOW (ref 8.9–10.3)
Chloride: 108 mmol/L (ref 101–111)
Creatinine, Ser: 1.84 mg/dL — ABNORMAL HIGH (ref 0.61–1.24)
GFR calc Af Amer: 38 mL/min — ABNORMAL LOW (ref >60)
GFR calc non Af Amer: 33 mL/min — ABNORMAL LOW (ref >60)
Glucose, Bld: 86 mg/dL (ref 65–99)
Potassium: 4 mmol/L (ref 3.5–5.1)
Sodium: 141 mmol/L (ref 135–145)

## 2017-04-08 MED ORDER — ACETAMINOPHEN 500 MG PO TABS
1000.0000 mg | ORAL_TABLET | Freq: Once | ORAL | Status: AC
  Administered 2017-04-08: 1000 mg via ORAL
  Filled 2017-04-08: qty 2

## 2017-04-08 NOTE — ED Triage Notes (Signed)
Pt arrived to the ED accompanied by his wife for complaints of left leg swelling and pain. Pt reports that for the last 2 weeks he has noticed increased swelling and now pain to his left leg. Pt reports that he is a diabetic and that he is in blood thinners. Pt is AOx4 in no apparent distress and had +2 bilateral leg edema.

## 2017-04-08 NOTE — ED Provider Notes (Signed)
[facility] Emergency Department Provider Note  ____________________________________________  Time seen: Approximately 9:43 PM  I have reviewed the triage vital signs and the nursing notes.   HISTORY  Chief Complaint Leg Swelling and Leg Pain    HPI [name] is a 84 y.o. male on Pradaxa for A. fib presenting with 2 weeks of progressively worsening left lower extremity pain and swelling.  The patient denies any trauma but states that for the past 2 weeks, he has had increasing swelling and pain that is diffuse but worse at the knee.  He has not had any difficulty walking.  He has tried 500 mg of Tylenol once which she states did not help.  He denies any chest pain, shortness of breath, fever or chills.  Past Medical History:  Diagnosis Date  . Diabetes ([place])   . Hearing loss   . High blood pressure   . History of bladder problems   . Kidney stones 11/22/2014    Patient Active Problem List   Diagnosis Date Noted  . Uncontrolled type 2 diabetes mellitus with hyperglycemia ([place]) 03/24/2017  . Screening for prostate cancer 03/24/2017  . Chest pain 05/04/2016  . Elevated troponin 05/04/2016  . Essential hypertension 07/06/2014  . Cough with expectoration 03/21/2014  . OSA (obstructive sleep apnea) 03/21/2014  . Paroxysmal A-fib ([place]) 03/21/2014  . Hx of adenomatous colonic polyps 06/28/2013  . Liver cyst 06/28/2013  . Chronic kidney disease 06/08/2013  . Diabetes ([place]) 06/08/2013  . Hyperlipidemia, mixed 06/08/2013  . Valvular heart disease 06/08/2013    Past Surgical History:  Procedure Laterality Date  . PROSTATE CANCER    . PROSTATE SURGERY      Current Outpatient Rx  . Order #: [id number] Class: Historical Med  . Order #: [id number] Class: Historical Med  . Order #: [id number] Class: Normal  . Order #: [id number] Class: Normal  . Order #: [id number] Class: Historical Med    Allergies Patient has no known allergies.  Family History   Problem Relation Age of Onset  . Cancer Mother   . Diabetes Unknown   . High blood pressure Unknown   . Prostate cancer Brother     Social History Social History   Tobacco Use  . Smoking status: Former Smoker    Years: 16.00    Types: Cigarettes  . Smokeless tobacco: Never Used  Substance Use Topics  . Alcohol use: No  . Drug use: No    Review of Systems Constitutional: No fever/chills. No lightheadedness or syncope. ENT:  No congestion or rhinorrhea. Cardiovascular: Denies chest pain. Denies palpitations. Respiratory: Denies shortness of breath.  No cough. Gastrointestinal: No abdominal pain.  No nausea, no vomiting.  No diarrhea. Musculoskeletal: For left lower extremity swelling and pain.  Pain is worse in the knee. Skin: Negative for rash. Neurological: Negative for headaches. No focal numbness, tingling or weakness.     ____________________________________________   PHYSICAL EXAM:  VITAL SIGNS: ED Triage Vitals  Enc Vitals Group     BP 04/08/17 1916 (!) 153/77     Pulse Rate 04/08/17 1916 77     Resp 04/08/17 1916 18     Temp 04/08/17 1916 97.6 F (36.4 C)     Temp Source 04/08/17 1916 Oral     SpO2 04/08/17 1916 100 %     Weight 04/08/17 1918 262 lb (118.8 kg)     Height 04/08/17 1918 6' (1.829 m)     Head Circumference --      Peak Flow --  Pain Score 04/08/17 1928 8     Pain Loc --      Pain Edu? --      Excl. in [place]? --     Constitutional: Alert and oriented.  Chronically ill appearing but in no acute distress. Answers questions appropriately. Eyes: Conjunctivae are normal.  EOMI. No scleral icterus. Head: Atraumatic. Nose: No congestion/rhinnorhea. Mouth/Throat: Mucous membranes are moist.  Neck: No stridor.  Supple.   Cardiovascular: Normal rate, regular rhythm. No murmurs, rubs or gallops.  Respiratory: Normal respiratory effort.  No accessory muscle use or retractions. Lungs CTAB.  No wheezes, rales or ronchi. Musculoskeletal: Positive  asymmetric left lower extremity pitting edema to the knee.  Normal DP and PT pulses bilaterally.  No ttp in the calves or palpable cords.  Negative Homan's sign.  Full range of motion of the left ankle knee and hip with mild discomfort in the left ankle and knee.  No evidence of skin break, knee effusion. Neurologic:  A&Ox3.  Speech is clear.  Face and smile are symmetric.  EOMI.  Moves all extremities well. Skin:  Skin is warm, dry and intact. No rash noted. Psychiatric: Mood and affect are normal. Speech and behavior are normal.  Normal judgement.  ____________________________________________   LABS (all labs ordered are listed, but only abnormal results are displayed)  Labs Reviewed  BASIC METABOLIC PANEL   ____________________________________________  EKG  Not indicated ____________________________________________  RADIOLOGY  US Venous Img Lower Unilateral Left  Result Date: 04/08/2017 CLINICAL DATA:  LEFT leg swelling and pain for 2 weeks EXAM: LEFT LOWER EXTREMITY VENOUS DOPPLER ULTRASOUND TECHNIQUE: Gray-scale sonography with graded compression, as well as color Doppler and duplex ultrasound were performed to evaluate the lower extremity deep venous systems from the level of the common femoral vein and including the common femoral, femoral, profunda femoral, popliteal and calf veins including the posterior tibial, peroneal and gastrocnemius veins when visible. The superficial great saphenous vein was also interrogated. Spectral Doppler was utilized to evaluate flow at rest and with distal augmentation maneuvers in the common femoral, femoral and popliteal veins. COMPARISON:  09/27/2013 FINDINGS: Contralateral Common Femoral Vein: Respiratory phasicity is normal and symmetric with the symptomatic side. No evidence of thrombus. Normal compressibility. Common Femoral Vein: No evidence of thrombus. Normal compressibility, respiratory phasicity and response to augmentation. Saphenofemoral  Junction: No evidence of thrombus. Normal compressibility and flow on color Doppler imaging. Profunda Femoral Vein: No evidence of thrombus. Normal compressibility and flow on color Doppler imaging. Femoral Vein: No evidence of thrombus. Normal compressibility, respiratory phasicity and response to augmentation. Popliteal Vein: No evidence of thrombus. Normal compressibility, respiratory phasicity and response to augmentation. Calf Veins: No evidence of thrombus. Normal compressibility and flow on color Doppler imaging. Superficial Great Saphenous Vein: No evidence of thrombus. Normal compressibility. Venous Reflux:  None. Other Findings:  None. IMPRESSION: No evidence of deep venous thrombosis in the LEFT lower extremity. Electronically Signed   By: Lavonia Dana M.D.   On: 04/08/2017 20:20    ____________________________________________   PROCEDURES  Procedure(s) performed: None  Procedures  Critical Care performed: No ____________________________________________   INITIAL IMPRESSION / ASSESSMENT AND PLAN / ED COURSE  Pertinent labs & imaging results that were available during my care of the patient were reviewed by me and considered in my medical decision making (see chart for details).  84 y.o. male on Pradaxa for A. fib presenting with 2 weeks of progressively worsening left lower extremity swelling and pain that is nontraumatic.  Overall, the patient is afebrile.  His extremity examination does show some pain in the knee without any overlying erythema or warmth.  It is very unlikely that he has septic arthritis in any of his left lower extremity joints.  His ultrasound does not show any evidence of DVT in the left lower extremity.  Venous insufficiency as a possible cause of his swelling.  I would also consider gout although he is not a candidate for NSAID treatment given his Pradaxa; he also does not have other typical features and has never had gout before.  At this time, we will plan to  treat the patient with Tylenol and I have discussed the use of elevation and compression stockings with the patient and his wife.  I have encouraged them to follow-up with her primary care physician in the next 3-4 days and I have given them return precautions.  At this time, the patient is safe for discharge home.  ____________________________________________  FINAL CLINICAL IMPRESSION(S) / ED DIAGNOSES  Final diagnoses:  Left leg swelling  Left leg pain         NEW MEDICATIONS STARTED DURING THIS VISIT:  New Prescriptions   No medications on file      Eula Listen, MD 04/08/17 2154

## 2017-04-08 NOTE — ED Notes (Signed)
First nurse note  Brought over from [facility] with left leg pain /swelling

## 2017-04-08 NOTE — ED Notes (Signed)
No answer when called several times from lobby 

## 2017-04-08 NOTE — Discharge Instructions (Signed)
You may take 1000 mg of Tylenol by mouth up to 3 times daily for your pain.  Please keep your left leg elevated above the level of your heart as much as possible.  You may wear compression stockings during the day to decrease swelling.  Return to the emergency department if you develop severe pain, chest pain or shortness of breath, lightheadedness or fainting, or any other symptoms concerning to you.

## 2017-04-10 ENCOUNTER — Encounter: Payer: Self-pay | Admitting: Nurse Practitioner

## 2017-04-10 ENCOUNTER — Ambulatory Visit: Payer: Medicare Other | Admitting: Nurse Practitioner

## 2017-04-10 VITALS — BP 137/87 | HR 76 | Resp 16 | Ht 72.0 in | Wt 257.6 lb

## 2017-04-10 DIAGNOSIS — I480 Paroxysmal atrial fibrillation: Secondary | ICD-10-CM

## 2017-04-10 DIAGNOSIS — L819 Disorder of pigmentation, unspecified: Secondary | ICD-10-CM | POA: Diagnosis not present

## 2017-04-10 DIAGNOSIS — M109 Gout, unspecified: Secondary | ICD-10-CM | POA: Diagnosis not present

## 2017-04-10 DIAGNOSIS — E1165 Type 2 diabetes mellitus with hyperglycemia: Secondary | ICD-10-CM

## 2017-04-10 DIAGNOSIS — I10 Essential (primary) hypertension: Secondary | ICD-10-CM

## 2017-04-10 MED ORDER — TRETINOIN 0.025 % EX CREA: TOPICAL_CREAM | Freq: Every day | CUTANEOUS | 3 refills | Status: DC

## 2017-04-10 NOTE — Progress Notes (Signed)
[facility] [place], [place] [address]  Internal MEDICINE  Office Visit Note  Patient Name: [name]  [id number]  [id number]  Date of Service: 04/10/2017     Chief Complaint  Patient presents with  . Other    hospital follow up      The patient is here for hospital follow up visit. He was seen in ER earlier this week after his left leg swelled from foot, up past the knee. It was red and very tender. The ER staff performed a vascular ultrasound to evaluate for blood clot. This was negative. They believe he had flare of gout. Was told to get some compression stockings to wear while on his feet and to take OTC tylenol as needed for pain.  Today, he is feeling much better. Leg swelling has nearly resolved. He does have some shiny, flaky skin on the front of the left lower leg as well as hyperpigmentation of the lower leg. There is no longer any redness or tenderness in the leg, ankle, or foot.   Pt is here for recent hospital follow up.  Current Medication: Outpatient Encounter Medications as of 04/10/2017  Medication Sig  . CARTIA XT 120 MG 24 hr capsule Take 120 mg by mouth daily.  Marland Kitchen glipiZIDE (GLUCOTROL) 10 MG tablet Take 10 mg by mouth 2 (two) times daily.  . nitroGLYCERIN (NITROSTAT) 0.4 MG SL tablet Place 1 tablet (0.4 mg total) under the tongue every 5 (five) minutes as needed for chest pain. (Patient not taking: Reported on 03/24/2017)  . ONETOUCH VERIO test strip USE UTD TO CHECK BLOOD SUGAR ONCE D  . PRADAXA 150 MG CAPS capsule Take 150 mg by mouth 2 (two) times daily.  Marland Kitchen tretinoin (RETIN-A) 0.025 % cream Apply topically at bedtime.   No facility-administered encounter medications on file as of 04/10/2017.     Surgical History: Past Surgical History:  Procedure Laterality Date  . PROSTATE CANCER    . PROSTATE SURGERY      Medical History: Past Medical History:  Diagnosis Date  . Diabetes ([place])   . Hearing loss   . High blood  pressure   . History of bladder problems   . Kidney stones 11/22/2014    Family History: Family History  Problem Relation Age of Onset  . Cancer Mother   . Diabetes Unknown   . High blood pressure Unknown   . Prostate cancer Brother     Social History   Socioeconomic History  . Marital status: Married    Spouse name: Not on file  . Number of children: Not on file  . Years of education: Not on file  . Highest education level: Not on file  Occupational History  . Not on file  Social Needs  . Financial resource strain: Not on file  . Food insecurity:    Worry: Not on file    Inability: Not on file  . Transportation needs:    Medical: Not on file    Non-medical: Not on file  Tobacco Use  . Smoking status: Former Smoker    Years: 16.00    Types: Cigarettes  . Smokeless tobacco: Never Used  Substance and Sexual Activity  . Alcohol use: No  . Drug use: No  . Sexual activity: Not on file  Lifestyle  . Physical activity:    Days per week: Not on file    Minutes per session: Not on file  . Stress: Not on file  Relationships  . Social connections:    Talks on phone: Not on file    Gets together: Not on file    Attends religious service: Not on file    Active member of club or organization: Not on file    Attends meetings of clubs or organizations: Not on file    Relationship status: Not on file  . Intimate partner violence:    Fear of current or ex partner: Not on file    Emotionally abused: Not on file    Physically abused: Not on file    Forced sexual activity: Not on file  Other Topics Concern  . Not on file  Social History Narrative  . Not on file      Review of Systems  Constitutional: Negative for activity change, chills, fatigue and unexpected weight change.  HENT: Negative for congestion, postnasal drip, rhinorrhea, sneezing and sore throat.   Eyes: Negative.  Negative for redness.  Respiratory: Negative for cough, chest tightness and shortness of  breath.   Cardiovascular: Positive for leg swelling. Negative for chest pain and palpitations.  Gastrointestinal: Negative for abdominal pain, constipation, diarrhea, nausea and vomiting.  Endocrine:       Blood sugars doing well   Genitourinary: Negative for dysuria and frequency.  Musculoskeletal: Positive for arthralgias. Negative for back pain, joint swelling and neck pain.  Skin: Negative for rash.       Darkened discoloration to left lower leg. Hs dry, shiny, flaky skin on this part of left lower leg.   Neurological: Negative for tremors, numbness and headaches.  Hematological: Negative for adenopathy. Does not bruise/bleed easily.  Psychiatric/Behavioral: Negative for behavioral problems (Depression), sleep disturbance and suicidal ideas. The patient is not nervous/anxious.    Today's Vitals   04/10/17 0954  BP: 137/87  Pulse: 76  Resp: 16  SpO2: 97%  Weight: 257 lb 9.6 oz (116.8 kg)  Height: 6' (1.829 m)    Physical Exam  Constitutional: He is oriented to person, place, and time. He appears well-developed and well-nourished. No distress.  HENT:  Head: Normocephalic and atraumatic.  Mouth/Throat: Oropharynx is clear and moist. No oropharyngeal exudate.  Eyes: Pupils are equal, round, and reactive to light. EOM are normal.  Neck: Normal range of motion. Neck supple. No JVD present. Carotid bruit is not present. No tracheal deviation present. No thyromegaly present.  Cardiovascular: Normal rate, regular rhythm and normal heart sounds. Exam reveals no gallop and no friction rub.  No murmur heard. Mild pitting edema noted in left lower leg. Pedal pulse intact. Capillary refill less than 3 seconds. No warmth or heat associated with palpation of left lower leg.   Pulmonary/Chest: Effort normal and breath sounds normal. No respiratory distress. He has no wheezes. He has no rales. He exhibits no tenderness.  Abdominal: Soft. Bowel sounds are normal. There is no tenderness.   Musculoskeletal: Normal range of motion.  Lymphadenopathy:    He has no cervical adenopathy.  Neurological: He is alert and oriented to person, place, and time. No cranial nerve deficit.  Skin: Skin is warm and dry. He is not diaphoretic.  Hyperpigmentation along anterior surface of left lower leg. Skin is dry, flaky, and dry. No rdness or warmth present.   Psychiatric: He has a normal mood and affect. His behavior is normal. Judgment and thought content normal.  Nursing note and vitals reviewed.  Assessment/Plan: 1. Acute gout of left ankle, unspecified cause Diagnosis in hospital. Has resolved. Should still take OTC tylenol  as needed and as indicated for left lower leg pain. Elevate the foot when possible. Compression socks as recommended   2. Hyperpigmentation Left lower leg only. - tretinoin (RETIN-A) 0.025 % cream; Apply topically at bedtime.  Dispense: 45 g; Refill: 3  3. Essential hypertension Stable. Continue bp medication as perscribed .  4. Uncontrolled type 2 diabetes mellitus with hyperglycemia ([place]) Continue diabetic medication as prescribed.   5. Paroxysmal A-fib (HCC) Regular visits with cardiology as scheduled.   General Counseling: nakeem murnane understanding of the findings of todays visit and agrees with plan of treatment. I have discussed any further diagnostic evaluation that may be needed or ordered today. We also reviewed his medications today. he has been encouraged to call the office with any questions or concerns that should arise related to todays visit.    This patient was seen by Leretha Pol, FNP- C in Collaboration with Dr Lavera Guise as a part of collaborative care agreement    I have reviewed all medical records from hospital follow up including radiology reports and consults from other physicians. Appropriate follow up diagnostics will be scheduled as needed. Patient/ Family understands the plan of treatment. Time spent 15 minutes.   Dr  Lavera Guise, MD Internal Medicine

## 2017-04-19 DIAGNOSIS — G4733 Obstructive sleep apnea (adult) (pediatric): Secondary | ICD-10-CM | POA: Diagnosis not present

## 2017-05-15 DIAGNOSIS — M19071 Primary osteoarthritis, right ankle and foot: Secondary | ICD-10-CM | POA: Diagnosis not present

## 2017-05-15 DIAGNOSIS — E1122 Type 2 diabetes mellitus with diabetic chronic kidney disease: Secondary | ICD-10-CM | POA: Diagnosis not present

## 2017-05-15 DIAGNOSIS — L84 Corns and callosities: Secondary | ICD-10-CM | POA: Diagnosis not present

## 2017-05-15 DIAGNOSIS — M79671 Pain in right foot: Secondary | ICD-10-CM | POA: Diagnosis not present

## 2017-05-19 ENCOUNTER — Encounter: Payer: Self-pay | Admitting: Podiatry

## 2017-05-19 ENCOUNTER — Ambulatory Visit: Payer: Medicare Other | Admitting: Podiatry

## 2017-05-19 DIAGNOSIS — B351 Tinea unguium: Secondary | ICD-10-CM

## 2017-05-19 DIAGNOSIS — M79676 Pain in unspecified toe(s): Secondary | ICD-10-CM | POA: Diagnosis not present

## 2017-05-19 DIAGNOSIS — E119 Type 2 diabetes mellitus without complications: Secondary | ICD-10-CM

## 2017-05-19 DIAGNOSIS — Q828 Other specified congenital malformations of skin: Secondary | ICD-10-CM | POA: Diagnosis not present

## 2017-05-19 NOTE — Progress Notes (Signed)
Complaint:  Visit Type: Patient returns to my office for continued preventative foot care services. Complaint: Patient states" my nails have grown long and thick and become painful to walk and wear shoes" Patient has been diagnosed with DM with no foot complications. The patient presents for preventative foot care services. No changes to ROS.  Patient states he has developed a painful spot on the bottom of his right forefoot.  He was [facility] by [facility] and xrays were taken and he was prescribed doxycycline.  He presents to the office requesting this pain skin lesion be evaluated.  Podiatric Exam: Vascular: dorsalis pedis and posterior tibial pulses are palpable bilateral. Capillary return is immediate. Temperature gradient is WNL. Skin turgor WNL  Sensorium: Normal Semmes Weinstein monofilament test. Normal tactile sensation bilaterally. Nail Exam: Pt has thick disfigured discolored nails with subungual debris noted bilateral entire nail hallux through fifth toenails Ulcer Exam: There is no evidence of ulcer or pre-ulcerative changes or infection. Orthopedic Exam: Muscle tone and strength are WNL. No limitations in general ROM. No crepitus or effusions noted. Foot type and digits show no abnormalities. Bony prominences are unremarkable. Skin:  Porokeratosis sub 3 right.  No infection noted. No infection or ulcers  Diagnosis:  Onychomycosis, , Pain in right toe, pain in left toes  Porokeratosis  Treatment & Plan Procedures and Treatment: Consent by patient was obtained for treatment procedures. The patient understood the discussion of treatment and procedures well. All questions were answered thoroughly reviewed. Debridement of mycotic and hypertrophic toenails, 1 through 5 bilateral and clearing of subungual debris. No ulceration, no infection noted. Debridement of porokeratosis.  D/C doxycycline.   Return Visit-Office Procedure: Patient instructed to return to the office for a follow up visit 3  months for continued evaluation and treatment.    Gardiner Barefoot DPM

## 2017-05-20 DIAGNOSIS — G4733 Obstructive sleep apnea (adult) (pediatric): Secondary | ICD-10-CM | POA: Diagnosis not present

## 2017-05-28 DIAGNOSIS — L03113 Cellulitis of right upper limb: Secondary | ICD-10-CM | POA: Diagnosis not present

## 2017-05-28 DIAGNOSIS — R600 Localized edema: Secondary | ICD-10-CM | POA: Diagnosis not present

## 2017-05-28 DIAGNOSIS — Z23 Encounter for immunization: Secondary | ICD-10-CM | POA: Diagnosis not present

## 2017-05-28 DIAGNOSIS — S51801A Unspecified open wound of right forearm, initial encounter: Secondary | ICD-10-CM | POA: Diagnosis not present

## 2017-05-29 ENCOUNTER — Encounter: Payer: Self-pay | Admitting: Emergency Medicine

## 2017-05-29 ENCOUNTER — Emergency Department: Payer: Medicare Other

## 2017-05-29 ENCOUNTER — Emergency Department
Admission: EM | Admit: 2017-05-29 | Discharge: 2017-05-29 | Disposition: A | Discharge status: Home / Self Care | Payer: Medicare Other | Attending: Emergency Medicine | Admitting: Emergency Medicine

## 2017-05-29 DIAGNOSIS — M25552 Pain in left hip: Secondary | ICD-10-CM | POA: Insufficient documentation

## 2017-05-29 DIAGNOSIS — M7989 Other specified soft tissue disorders: Secondary | ICD-10-CM | POA: Diagnosis not present

## 2017-05-29 DIAGNOSIS — N189 Chronic kidney disease, unspecified: Secondary | ICD-10-CM | POA: Diagnosis not present

## 2017-05-29 DIAGNOSIS — M25561 Pain in right knee: Secondary | ICD-10-CM | POA: Insufficient documentation

## 2017-05-29 DIAGNOSIS — Z87891 Personal history of nicotine dependence: Secondary | ICD-10-CM | POA: Diagnosis not present

## 2017-05-29 DIAGNOSIS — E1122 Type 2 diabetes mellitus with diabetic chronic kidney disease: Secondary | ICD-10-CM | POA: Diagnosis not present

## 2017-05-29 DIAGNOSIS — M79605 Pain in left leg: Secondary | ICD-10-CM | POA: Diagnosis not present

## 2017-05-29 DIAGNOSIS — M25462 Effusion, left knee: Secondary | ICD-10-CM | POA: Diagnosis not present

## 2017-05-29 DIAGNOSIS — Z79899 Other long term (current) drug therapy: Secondary | ICD-10-CM | POA: Diagnosis not present

## 2017-05-29 DIAGNOSIS — I129 Hypertensive chronic kidney disease with stage 1 through stage 4 chronic kidney disease, or unspecified chronic kidney disease: Secondary | ICD-10-CM | POA: Insufficient documentation

## 2017-05-29 MED ORDER — DICLOFENAC SODIUM 1 % TD GEL: 4.0000 g | Freq: Four times a day (QID) | TRANSDERMAL | 3 refills | Status: DC

## 2017-05-29 NOTE — ED Notes (Signed)
See triage note  Presents with pain to left knee  States he has had some swelling to lower leg    Has been seen by PCP   Had u/s done to r/o dvt

## 2017-05-29 NOTE — ED Provider Notes (Signed)
[facility] Emergency Department Provider Note  ____________________________________________   First MD Initiated Contact with Patient 05/29/17 1458     (approximate)  I have reviewed the triage vital signs and the nursing notes.   HISTORY  Chief Complaint Knee Pain    HPI [name] is a 84 y.o. male presents emergency department complaining of left knee and left hip pain.  He states the left leg has been swollen.  He feels that the pain is coming from walking on concrete floors at work.  He does have diabetes.  His wife is concerned as the leg is been swollen.  She is concerned he might also have a blood clot.  Past Medical History:  Diagnosis Date  . Diabetes ([place])   . Hearing loss   . High blood pressure   . History of bladder problems   . Kidney stones 11/22/2014    Patient Active Problem List   Diagnosis Date Noted  . Acute gout of left ankle 04/10/2017  . Hyperpigmentation 04/10/2017  . Uncontrolled type 2 diabetes mellitus with hyperglycemia ([place]) 03/24/2017  . Screening for prostate cancer 03/24/2017  . Chest pain 05/04/2016  . Elevated troponin 05/04/2016  . Essential hypertension 07/06/2014  . Cough with expectoration 03/21/2014  . OSA (obstructive sleep apnea) 03/21/2014  . Paroxysmal A-fib ([place]) 03/21/2014  . Hx of adenomatous colonic polyps 06/28/2013  . Liver cyst 06/28/2013  . Chronic kidney disease 06/08/2013  . Diabetes ([place]) 06/08/2013  . Hyperlipidemia, mixed 06/08/2013  . Valvular heart disease 06/08/2013    Past Surgical History:  Procedure Laterality Date  . PROSTATE CANCER    . PROSTATE SURGERY      Prior to Admission medications   Medication Sig Start Date End Date Taking? Authorizing Provider  CARTIA XT 120 MG 24 hr capsule Take 120 mg by mouth daily. 04/22/16   [provider]  diclofenac sodium (VOLTAREN) 1 % GEL Apply 4 g topically 4 (four) times daily. 05/29/17   Deiontae Rabel, Linden Dolin, PA-C    glipiZIDE (GLUCOTROL) 10 MG tablet Take 10 mg by mouth 2 (two) times daily.    [provider]  nitroGLYCERIN (NITROSTAT) 0.4 MG SL tablet Place 1 tablet (0.4 mg total) under the tongue every 5 (five) minutes as needed for chest pain. Patient not taking: Reported on 03/24/2017 05/05/16   Theodoro Grist, MD  [facility] VERIO test strip USE UTD TO CHECK BLOOD SUGAR ONCE D 02/03/17   Boscia, Greer Ee, NP  PRADAXA 150 MG CAPS capsule Take 150 mg by mouth 2 (two) times daily. 04/05/16   [provider]  tretinoin (RETIN-A) 0.025 % cream Apply topically at bedtime. 04/10/17   Ronnell Freshwater, NP    Allergies Patient has no known allergies.  Family History  Problem Relation Age of Onset  . Cancer Mother   . Diabetes Unknown   . High blood pressure Unknown   . Prostate cancer Brother     Social History Social History   Tobacco Use  . Smoking status: Former Smoker    Years: 16.00    Types: Cigarettes  . Smokeless tobacco: Never Used  Substance Use Topics  . Alcohol use: No  . Drug use: No    Review of Systems  Constitutional: No fever/chills Eyes: No visual changes. ENT: No sore throat. Respiratory: Denies cough Genitourinary: Negative for dysuria. Musculoskeletal: Negative for back pain.  Left knee and hip pain with lower leg swelling Skin: Negative for rash.  Patient  has darkened skin on the lower extremities due to vascular deficiencies    ____________________________________________   PHYSICAL EXAM:  VITAL SIGNS: ED Triage Vitals  Enc Vitals Group     BP 05/29/17 1416 (!) 147/97     Pulse Rate 05/29/17 1416 86     Resp 05/29/17 1416 18     Temp 05/29/17 1416 97.6 F (36.4 C)     Temp Source 05/29/17 1416 Oral     SpO2 05/29/17 1416 95 %     Weight 05/29/17 1428 260 lb (117.9 kg)     Height 05/29/17 1428 6' (1.829 m)     Head Circumference --      Peak Flow --      Pain Score 05/29/17 1427 10     Pain Loc --      Pain Edu? --      Excl. in  [place]? --     Constitutional: Alert and oriented. Well appearing and in no acute distress. Eyes: Conjunctivae are normal.  Head: Atraumatic. Nose: No congestion/rhinnorhea. Mouth/Throat: Mucous membranes are moist.   Cardiovascular: Normal rate, regular rhythm.  Heart sounds are normal Respiratory: Normal respiratory effort.  No retractions, lungs are clear to auscultation GU: deferred Musculoskeletal: FROM all extremities, warm and well perfused, the left knee is tender at the joint line there is a small amount of swelling noted posteriorly.  The calf and the back of the knee are little tender.  The left hip is mildly tender.  The patient was able to ambulate without difficulty. Neurologic:  Normal speech and language.  Skin:  Skin is warm, dry and intact. No rash noted. Psychiatric: Mood and affect are normal. Speech and behavior are normal.  ____________________________________________   LABS (all labs ordered are listed, but only abnormal results are displayed)  Labs Reviewed - No data to display ____________________________________________   ____________________________________________  RADIOLOGY  X-ray of the left knee and left hip are negative for any acute bony abnormalities.  Does have degenerative changes.  Questionable small effusion of the left knee.  Ultrasound of the left leg is negative for DVT  ____________________________________________   PROCEDURES  Procedure(s) performed: No  Procedures    ____________________________________________   INITIAL IMPRESSION / ASSESSMENT AND PLAN / ED COURSE  Pertinent labs & imaging results that were available during my care of the patient were reviewed by me and considered in my medical decision making (see chart for details).  Patient is a 84 year old male presents emergency department complaining of left knee and left hip pain.  He is concerned about the left leg swelling.  On physical exam the left knee has some  swelling at the joint line and posteriorly.  The calf is tender.  The left hip is tender.  X-ray of the left hip and knee are both negative for any acute bony abnormality.  Ultrasound of the left lower leg is negative for DVT  Exam and radiology findings were discussed with the patient and his wife.  He was given a prescription for Voltaren gel to apply to the knee and the hip.  He is to apply ice to the knee.  If the leg pain is worsening he should return to the emergency department for further evaluation.  He was given a work note as I explained to him that he needs to be off of the leg for it to heal.  He states he understands will comply with instructions.  He was discharged in stable condition  As part of my medical decision making, I reviewed the following data within the [facility] notes reviewed and incorporated, Old chart reviewed, Radiograph reviewed x-ray of the left knee and left hip are negative for any acute bony abnormality, ultrasound of the left leg is negative for DVT, Notes from prior ED visits and  Controlled Substance Database  ____________________________________________   FINAL CLINICAL IMPRESSION(S) / ED DIAGNOSES  Final diagnoses:  Acute pain of right knee  Effusion of left knee  Pain of left hip joint      NEW MEDICATIONS STARTED DURING THIS VISIT:  Discharge Medication List as of 05/29/2017  5:12 PM    START taking these medications   Details  diclofenac sodium (VOLTAREN) 1 % GEL Apply 4 g topically 4 (four) times daily., Starting Thu 05/29/2017, Print         Note:  This document was prepared using Dragon voice recognition software and may include unintentional dictation errors.    Versie Starks, PA-C 05/29/17 1752    [name], MD 05/29/17 2005

## 2017-05-29 NOTE — Discharge Instructions (Addendum)
All up with your regular doctor as needed.  Apply the Voltaren gel to your knee and your hip every 4-6 hours as needed for pain.  Apply ice to any areas that hurt.

## 2017-05-29 NOTE — ED Triage Notes (Signed)
Pt comes into the ED via POV c/o left knee pain that started last night.  Patient denies any injury to the knee or any fall.  Patient walks on cement all day and is diabetic.  Patient has swelling noted to the lower legs but has been seen by his PCP in regards to it.  Patient in NAd at this time.

## 2017-05-30 ENCOUNTER — Other Ambulatory Visit: Payer: Self-pay

## 2017-05-30 NOTE — Patient Outreach (Signed)
05/30/17 Successful outreach to [employer]. Stated he feels much better since his ED visit. Swelling in knee has gone down. Verified PCP and states he has an appt in June 2019.  Pt gave phone to his wife because he is hard of hearing. Gave wife 24-hr Nurse Advice Line number and explained [facility] CM services. She stated that patient currently does not need services but was appreciative of phone call. Will send information packet.  [place] Management Assistant

## 2017-06-03 DIAGNOSIS — I10 Essential (primary) hypertension: Secondary | ICD-10-CM | POA: Diagnosis not present

## 2017-06-03 DIAGNOSIS — G4733 Obstructive sleep apnea (adult) (pediatric): Secondary | ICD-10-CM | POA: Diagnosis not present

## 2017-06-03 DIAGNOSIS — I482 Chronic atrial fibrillation: Secondary | ICD-10-CM | POA: Diagnosis not present

## 2017-06-03 DIAGNOSIS — E782 Mixed hyperlipidemia: Secondary | ICD-10-CM | POA: Diagnosis not present

## 2017-06-03 DIAGNOSIS — I480 Paroxysmal atrial fibrillation: Secondary | ICD-10-CM | POA: Diagnosis not present

## 2017-06-04 ENCOUNTER — Other Ambulatory Visit: Payer: Self-pay | Admitting: Internal Medicine

## 2017-06-04 ENCOUNTER — Other Ambulatory Visit: Payer: Self-pay | Admitting: Nurse Practitioner

## 2017-06-04 MED ORDER — GLIPIZIDE 10 MG PO TABS: 10.0000 mg | ORAL_TABLET | Freq: Two times a day (BID) | ORAL | 0 refills | Status: DC

## 2017-06-19 DIAGNOSIS — G4733 Obstructive sleep apnea (adult) (pediatric): Secondary | ICD-10-CM | POA: Diagnosis not present

## 2017-06-27 ENCOUNTER — Other Ambulatory Visit: Payer: Self-pay | Admitting: Nurse Practitioner

## 2017-06-27 DIAGNOSIS — Z125 Encounter for screening for malignant neoplasm of prostate: Secondary | ICD-10-CM | POA: Diagnosis not present

## 2017-06-27 DIAGNOSIS — I10 Essential (primary) hypertension: Secondary | ICD-10-CM | POA: Diagnosis not present

## 2017-06-27 DIAGNOSIS — E1165 Type 2 diabetes mellitus with hyperglycemia: Secondary | ICD-10-CM | POA: Diagnosis not present

## 2017-06-27 DIAGNOSIS — Z0001 Encounter for general adult medical examination with abnormal findings: Secondary | ICD-10-CM | POA: Diagnosis not present

## 2017-06-27 DIAGNOSIS — E782 Mixed hyperlipidemia: Secondary | ICD-10-CM | POA: Diagnosis not present

## 2017-06-28 LAB — COMPREHENSIVE METABOLIC PANEL
ALT: 24 IU/L (ref 0–44)
AST: 18 IU/L (ref 0–40)
Albumin/Globulin Ratio: 1.3 (ref 1.2–2.2)
Albumin: 4 g/dL (ref 3.5–4.7)
Alkaline Phosphatase: 116 IU/L (ref 39–117)
BUN/Creatinine Ratio: 12 (ref 10–24)
BUN: 25 mg/dL (ref 8–27)
Bilirubin Total: 0.2 mg/dL (ref 0.0–1.2)
CO2: 22 mmol/L (ref 20–29)
Calcium: 8.9 mg/dL (ref 8.6–10.2)
Chloride: 107 mmol/L — ABNORMAL HIGH (ref 96–106)
Creatinine, Ser: 2.05 mg/dL — ABNORMAL HIGH (ref 0.76–1.27)
GFR calc Af Amer: 34 mL/min/{1.73_m2} — ABNORMAL LOW (ref >59)
GFR calc non Af Amer: 29 mL/min/{1.73_m2} — ABNORMAL LOW (ref >59)
Globulin, Total: 3 g/dL (ref 1.5–4.5)
Glucose: 151 mg/dL — ABNORMAL HIGH (ref 65–99)
Potassium: 4.7 mmol/L (ref 3.5–5.2)
Sodium: 143 mmol/L (ref 134–144)
Total Protein: 7 g/dL (ref 6.0–8.5)

## 2017-06-28 LAB — CBC
Hematocrit: 44 % (ref 37.5–51.0)
Hemoglobin: 14.2 g/dL (ref 13.0–17.7)
MCH: 28.8 pg (ref 26.6–33.0)
MCHC: 32.3 g/dL (ref 31.5–35.7)
MCV: 89 fL (ref 79–97)
Platelets: 222 10*3/uL (ref 150–450)
RBC: 4.93 x10E6/uL (ref 4.14–5.80)
RDW: 15.7 % — ABNORMAL HIGH (ref 12.3–15.4)
WBC: 5.4 10*3/uL (ref 3.4–10.8)

## 2017-06-28 LAB — PSA: Prostate Specific Ag, Serum: 7.9 ng/mL — ABNORMAL HIGH (ref 0.0–4.0)

## 2017-06-28 LAB — LIPID PANEL W/O CHOL/HDL RATIO
Cholesterol, Total: 97 mg/dL — ABNORMAL LOW (ref 100–199)
HDL: 36 mg/dL — ABNORMAL LOW (ref >39)
LDL Calculated: 50 mg/dL (ref 0–99)
Triglycerides: 55 mg/dL (ref 0–149)
VLDL Cholesterol Cal: 11 mg/dL (ref 5–40)

## 2017-06-28 LAB — TSH: TSH: 2.14 u[IU]/mL (ref 0.450–4.500)

## 2017-06-28 LAB — T4, FREE: Free T4: 1.04 ng/dL (ref 0.82–1.77)

## 2017-06-28 LAB — MICROALBUMIN, URINE: Microalbumin, Urine: 62.6 ug/mL

## 2017-07-01 ENCOUNTER — Encounter: Payer: Self-pay | Admitting: Nurse Practitioner

## 2017-07-01 ENCOUNTER — Ambulatory Visit (INDEPENDENT_AMBULATORY_CARE_PROVIDER_SITE_OTHER): Payer: Medicare Other | Admitting: Nurse Practitioner

## 2017-07-01 VITALS — BP 133/82 | HR 66 | Resp 16 | Ht 72.0 in | Wt 264.4 lb

## 2017-07-01 DIAGNOSIS — I10 Essential (primary) hypertension: Secondary | ICD-10-CM | POA: Diagnosis not present

## 2017-07-01 DIAGNOSIS — N400 Enlarged prostate without lower urinary tract symptoms: Secondary | ICD-10-CM | POA: Diagnosis not present

## 2017-07-01 DIAGNOSIS — E1165 Type 2 diabetes mellitus with hyperglycemia: Secondary | ICD-10-CM | POA: Diagnosis not present

## 2017-07-01 DIAGNOSIS — R300 Dysuria: Secondary | ICD-10-CM | POA: Diagnosis not present

## 2017-07-01 DIAGNOSIS — Z0001 Encounter for general adult medical examination with abnormal findings: Secondary | ICD-10-CM | POA: Diagnosis not present

## 2017-07-01 LAB — POCT GLYCOSYLATED HEMOGLOBIN (HGB A1C): Hemoglobin A1C: 6.9 % — AB (ref 4.0–5.6)

## 2017-07-01 MED ORDER — LISINOPRIL 5 MG PO TABS: 5.0000 mg | ORAL_TABLET | Freq: Every day | ORAL | 3 refills | Status: DC

## 2017-07-01 NOTE — Progress Notes (Signed)
[facility] [place], [place] [address]  Internal MEDICINE  Office Visit Note  Patient Name: [name]  [id number]  [id number]  Date of Service: 07/20/2017   Pt is here for routine health maintenance examination  Chief Complaint  Patient presents with  . medicare annual exam  . Diabetes  . Hypertension     Diabetes  He presents for his follow-up diabetic visit. He has type 2 diabetes mellitus. His disease course has been stable. There are no hypoglycemic associated symptoms. Pertinent negatives for hypoglycemia include no dizziness, headaches, nervousness/anxiousness or tremors. Pertinent negatives for diabetes include no chest pain and no fatigue. There are no hypoglycemic complications. Symptoms are improving. Diabetic complications include a CVA and PVD. Risk factors for coronary artery disease include diabetes mellitus, dyslipidemia, hypertension and male sex. Current diabetic treatment includes oral agent (monotherapy). He is compliant with treatment all of the time. His weight is stable. He is following a generally healthy diet. Meal planning includes avoidance of concentrated sweets. He has not had a previous visit with a dietitian. He participates in exercise intermittently. His home blood glucose trend is decreasing steadily. An ACE inhibitor/angiotensin II receptor blocker is being taken. He sees a podiatrist.Eye exam is current.  Hypertension  This is a chronic problem. The current episode started more than 1 year ago. The problem is unchanged. The problem is controlled. Associated symptoms include malaise/fatigue. Pertinent negatives include no chest pain, headaches, neck pain, palpitations or shortness of breath. There are no associated agents to hypertension. Risk factors for coronary artery disease include diabetes mellitus, dyslipidemia, male gender and family history. Past treatments include ACE inhibitors and calcium channel blockers. The  current treatment provides moderate improvement. There are no compliance problems.  Hypertensive end-organ damage includes CVA and PVD. Identifiable causes of hypertension include renovascular disease.     Current Medication: Outpatient Encounter Medications as of 07/01/2017  Medication Sig  . CARTIA XT 120 MG 24 hr capsule Take 120 mg by mouth daily.  . diclofenac sodium (VOLTAREN) 1 % GEL Apply 4 g topically 4 (four) times daily.  Marland Kitchen glipiZIDE (GLUCOTROL) 10 MG tablet Take 1 tablet (10 mg total) by mouth 2 (two) times daily.  Marland Kitchen PRADAXA 150 MG CAPS capsule Take 150 mg by mouth 2 (two) times daily.  Marland Kitchen tretinoin (RETIN-A) 0.025 % cream Apply topically at bedtime.  Marland Kitchen lisinopril (PRINIVIL,ZESTRIL) 5 MG tablet Take 1 tablet (5 mg total) by mouth daily.  . nitroGLYCERIN (NITROSTAT) 0.4 MG SL tablet Place 1 tablet (0.4 mg total) under the tongue every 5 (five) minutes as needed for chest pain. (Patient not taking: Reported on 03/24/2017)  . ONETOUCH VERIO test strip USE UTD TO CHECK BLOOD SUGAR ONCE D   No facility-administered encounter medications on file as of 07/01/2017.     Surgical History: Past Surgical History:  Procedure Laterality Date  . PROSTATE CANCER    . PROSTATE SURGERY      Medical History: Past Medical History:  Diagnosis Date  . Diabetes ([place])   . Hearing loss   . High blood pressure   . History of bladder problems   . Kidney stones 11/22/2014    Family History: Family History  Problem Relation Age of Onset  . Cancer Mother   . Diabetes Unknown   . High blood pressure Unknown   . Prostate cancer Brother       Review of Systems  Constitutional: Positive for malaise/fatigue. Negative for activity change, chills, fatigue  and unexpected weight change.  HENT: Negative for congestion, postnasal drip, rhinorrhea, sneezing, sore throat and voice change.   Eyes: Negative.  Negative for redness.  Respiratory: Negative for cough, chest tightness and shortness of breath.    Cardiovascular: Positive for leg swelling. Negative for chest pain and palpitations.  Gastrointestinal: Negative for abdominal pain, constipation, diarrhea, nausea and vomiting.  Endocrine:       Blood sugars doing well   Genitourinary: Negative for dysuria and frequency.  Musculoskeletal: Positive for arthralgias. Negative for back pain, joint swelling and neck pain.  Skin: Negative for rash.       Darkened discoloration to left lower leg. Hs dry, shiny, flaky skin on this part of left lower leg.   Allergic/Immunologic: Negative for environmental allergies.  Neurological: Negative for dizziness, tremors, numbness and headaches.  Hematological: Negative for adenopathy. Does not bruise/bleed easily.  Psychiatric/Behavioral: Negative for behavioral problems (Depression), sleep disturbance and suicidal ideas. The patient is not nervous/anxious.      Today's Vitals   07/01/17 1438  BP: 133/82  Pulse: 66  Resp: 16  SpO2: 94%  Weight: 264 lb 6.4 oz (119.9 kg)  Height: 6' (1.829 m)   Physical Exam  Constitutional: He is oriented to person, place, and time. He appears well-developed and well-nourished. No distress.  HENT:  Head: Normocephalic and atraumatic.  Nose: Nose normal.  Mouth/Throat: Oropharynx is clear and moist. No oropharyngeal exudate.  Eyes: Pupils are equal, round, and reactive to light. Conjunctivae and EOM are normal.  Neck: Normal range of motion. Neck supple. No JVD present. Carotid bruit is not present. No tracheal deviation present. No thyromegaly present.  Cardiovascular: Normal rate, regular rhythm, normal heart sounds and intact distal pulses. Exam reveals no gallop and no friction rub.  No murmur heard. Pulses:      Dorsalis pedis pulses are 1+ on the right side, and 1+ on the left side.       Posterior tibial pulses are 1+ on the right side, and 1+ on the left side.  Mild pitting edema noted in left lower leg. Pedal pulse intact. Capillary refill less than 3  seconds. No warmth or heat associated with palpation of left lower leg.   Pulmonary/Chest: Effort normal and breath sounds normal. No respiratory distress. He has no wheezes. He has no rales. He exhibits no tenderness.  Abdominal: Soft. Bowel sounds are normal. There is no tenderness.  Musculoskeletal: Normal range of motion.       Right foot: There is normal range of motion and no deformity.       Left foot: There is normal range of motion and no deformity.  Feet:  Right Foot:  Protective Sensation: 7 sites tested. 7 sites sensed.  Skin Integrity: Positive for dry skin. Negative for skin breakdown.  Left Foot:  Protective Sensation: 7 sites tested. 7 sites sensed.  Skin Integrity: Positive for dry skin. Negative for skin breakdown.  Lymphadenopathy:    He has no cervical adenopathy.  Neurological: He is alert and oriented to person, place, and time. No cranial nerve deficit or sensory deficit.  Skin: Skin is warm and dry. Capillary refill takes less than 2 seconds. He is not diaphoretic.  Hyperpigmentation along anterior surface of left lower leg. Skin is dry, flaky, and dry. No rdness or warmth present.   Psychiatric: He has a normal mood and affect. His behavior is normal. Judgment and thought content normal.  Nursing note and vitals reviewed.    LABS: Recent  Results (from the past 2160 hour(s))  Comprehensive metabolic panel     Status: Abnormal   Collection Time: 06/27/17  8:12 AM  Result Value Ref Range   Glucose 151 (H) 65 - 99 mg/dL   BUN 25 8 - 27 mg/dL   Creatinine, Ser 2.05 (H) 0.76 - 1.27 mg/dL   GFR calc non Af Amer 29 (L) >59 mL/min/1.73   GFR calc Af Amer 34 (L) >59 mL/min/1.73   BUN/Creatinine Ratio 12 10 - 24   Sodium 143 134 - 144 mmol/L   Potassium 4.7 3.5 - 5.2 mmol/L   Chloride 107 (H) 96 - 106 mmol/L   CO2 22 20 - 29 mmol/L   Calcium 8.9 8.6 - 10.2 mg/dL   Total Protein 7.0 6.0 - 8.5 g/dL   Albumin 4.0 3.5 - 4.7 g/dL   Globulin, Total 3.0 1.5 - 4.5 g/dL    Albumin/Globulin Ratio 1.3 1.2 - 2.2   Bilirubin Total 0.2 0.0 - 1.2 mg/dL   Alkaline Phosphatase 116 39 - 117 IU/L   AST 18 0 - 40 IU/L   ALT 24 0 - 44 IU/L  CBC     Status: Abnormal   Collection Time: 06/27/17  8:12 AM  Result Value Ref Range   WBC 5.4 3.4 - 10.8 x10E3/uL   RBC 4.93 4.14 - 5.80 x10E6/uL   Hemoglobin 14.2 13.0 - 17.7 g/dL   Hematocrit 44.0 37.5 - 51.0 %   MCV 89 79 - 97 fL   MCH 28.8 26.6 - 33.0 pg   MCHC 32.3 31.5 - 35.7 g/dL   RDW 15.7 (H) 12.3 - 15.4 %   Platelets 222 150 - 450 x10E3/uL  Lipid Panel w/o Chol/HDL Ratio     Status: Abnormal   Collection Time: 06/27/17  8:12 AM  Result Value Ref Range   Cholesterol, Total 97 (L) 100 - 199 mg/dL   Triglycerides 55 0 - 149 mg/dL   HDL 36 (L) >39 mg/dL   VLDL Cholesterol Cal 11 5 - 40 mg/dL   LDL Calculated 50 0 - 99 mg/dL  T4, free     Status: None   Collection Time: 06/27/17  8:12 AM  Result Value Ref Range   Free T4 1.04 0.82 - 1.77 ng/dL  TSH     Status: None   Collection Time: 06/27/17  8:12 AM  Result Value Ref Range   TSH 2.140 0.450 - 4.500 uIU/mL  PSA     Status: Abnormal   Collection Time: 06/27/17  8:12 AM  Result Value Ref Range   Prostate Specific Ag, Serum 7.9 (H) 0.0 - 4.0 ng/mL    Comment: Roche ECLIA methodology. According to the American Urological Association, Serum PSA should decrease and remain at undetectable levels after radical prostatectomy. The AUA defines biochemical recurrence as an initial PSA value 0.2 ng/mL or greater followed by a subsequent confirmatory PSA value 0.2 ng/mL or greater. Values obtained with different assay methods or kits cannot be used interchangeably. Results cannot be interpreted as absolute evidence of the presence or absence of malignant disease.   Microalbumin, urine     Status: None   Collection Time: 06/27/17  8:12 AM  Result Value Ref Range   Microalbumin, Urine 62.6 Not Estab. ug/mL  Urinalysis, Routine w reflex microscopic     Status:  Abnormal   Collection Time: 07/01/17  2:39 PM  Result Value Ref Range   Specific Gravity, UA 1.018 1.005 - 1.030   pH, UA 5.5 5.0 -  7.5   Color, UA Yellow Yellow   Appearance Ur Clear Clear   Leukocytes, UA Negative Negative   Protein, UA Negative Negative/Trace   Glucose, UA Negative Negative   Ketones, UA Negative Negative   RBC, UA Trace (A) Negative   Bilirubin, UA Negative Negative   Urobilinogen, Ur 0.2 0.2 - 1.0 mg/dL   Nitrite, UA Negative Negative   Microscopic Examination See below:     Comment: Microscopic was indicated and was performed.  Microscopic Examination     Status: Abnormal   Collection Time: 07/01/17  2:39 PM  Result Value Ref Range   WBC, UA 0-5 0 - 5 /hpf   RBC, UA 3-10 (A) 0 - 2 /hpf   Epithelial Cells (non renal) 0-10 0 - 10 /hpf   Casts None seen None seen /lpf   Mucus, UA Present Not Estab.   Bacteria, UA None seen None seen/Few  POCT HgB A1C     Status: Abnormal   Collection Time: 07/01/17  2:55 PM  Result Value Ref Range   Hemoglobin A1C 6.9 (A) 4.0 - 5.6 %   HbA1c, POC (prediabetic range)  5.7 - 6.4 %   HbA1c, POC (controlled diabetic range)  0.0 - 7.0 %    Assessment/Plan: 1. Encounter for general adult medical examination with abnormal findings Annual health maintenance exam performed today  2. Uncontrolled type 2 diabetes mellitus with hyperglycemia (HCC) - POCT HgB A1C 6.9 today. Continue diabetic medication as prescribed. Refer to opthamology for diabeticeye exam.  - lisinopril (PRINIVIL,ZESTRIL) 5 MG tablet; Take 1 tablet (5 mg total) by mouth daily.  Dispense: 90 tablet; Refill: 3 - Ambulatory referral to Ophthalmology  3. Essential hypertension Stable. Continue bp medication as prescribed.  - lisinopril (PRINIVIL,ZESTRIL) 5 MG tablet; Take 1 tablet (5 mg total) by mouth daily.  Dispense: 90 tablet; Refill: 3  4. Prostatic hypertrophy Elevated PSA at 7.9 today. Refer to urology for further evaluation and treatment. - Ambulatory  referral to Urology  5. Dysuria - Urinalysis, Routine w reflex microscopic  General Counseling: [name] verbalizes understanding of the findings of todays visit and agrees with plan of treatment. I have discussed any further diagnostic evaluation that may be needed or ordered today. We also reviewed his medications today. he has been encouraged to call the office with any questions or concerns that should arise related to todays visit.    Counseling:  Diabetes Counseling:  1. Addition of ACE inh/ ARB'S for nephroprotection. 2. Diabetic foot care, prevention of complications.  3.Exercise and lose weight.  4. Diabetic eye examination, 5. Monitor blood sugar closlely. nutrition counseling.  6.Sign and symptoms of hypoglycemia including shaking sweating,confusion and headaches.   This patient was seen by Leretha Pol, FNP- C in Collaboration with Dr Lavera Guise as a part of collaborative care agreement   Orders Placed This Encounter  Procedures  . Microscopic Examination  . Urinalysis, Routine w reflex microscopic  . Ambulatory referral to Urology  . Ambulatory referral to Ophthalmology  . POCT HgB A1C    Meds ordered this encounter  Medications  . lisinopril (PRINIVIL,ZESTRIL) 5 MG tablet    Sig: Take 1 tablet (5 mg total) by mouth daily.    Dispense:  90 tablet    Refill:  3    Order Specific Question:   Supervising Provider    Answer:   Lavera Guise [[id number]]    Time spent: [place], MD  Internal Medicine

## 2017-07-02 LAB — MICROSCOPIC EXAMINATION
Bacteria, UA: NONE SEEN
Casts: NONE SEEN /lpf

## 2017-07-02 LAB — URINALYSIS, ROUTINE W REFLEX MICROSCOPIC
Bilirubin, UA: NEGATIVE
Glucose, UA: NEGATIVE
Ketones, UA: NEGATIVE
Leukocytes, UA: NEGATIVE
Nitrite, UA: NEGATIVE
Protein, UA: NEGATIVE
Specific Gravity, UA: 1.018 (ref 1.005–1.030)
Urobilinogen, Ur: 0.2 mg/dL (ref 0.2–1.0)
pH, UA: 5.5 (ref 5.0–7.5)

## 2017-07-08 DIAGNOSIS — I480 Paroxysmal atrial fibrillation: Secondary | ICD-10-CM | POA: Diagnosis not present

## 2017-07-08 DIAGNOSIS — R079 Chest pain, unspecified: Secondary | ICD-10-CM | POA: Diagnosis not present

## 2017-07-20 DIAGNOSIS — N400 Enlarged prostate without lower urinary tract symptoms: Secondary | ICD-10-CM | POA: Insufficient documentation

## 2017-07-20 DIAGNOSIS — R300 Dysuria: Secondary | ICD-10-CM | POA: Insufficient documentation

## 2017-07-20 DIAGNOSIS — G4733 Obstructive sleep apnea (adult) (pediatric): Secondary | ICD-10-CM | POA: Diagnosis not present

## 2017-08-01 ENCOUNTER — Inpatient Hospital Stay
Admission: EM | Admit: 2017-08-01 | Discharge: 2017-08-04 | DRG: 291 | Disposition: A | Discharge status: Home / Self Care | Payer: Medicare Other | Attending: Internal Medicine | Admitting: Internal Medicine

## 2017-08-01 ENCOUNTER — Other Ambulatory Visit: Payer: Self-pay

## 2017-08-01 ENCOUNTER — Emergency Department: Payer: Medicare Other

## 2017-08-01 ENCOUNTER — Encounter: Payer: Self-pay | Admitting: Emergency Medicine

## 2017-08-01 DIAGNOSIS — E119 Type 2 diabetes mellitus without complications: Secondary | ICD-10-CM

## 2017-08-01 DIAGNOSIS — R7989 Other specified abnormal findings of blood chemistry: Secondary | ICD-10-CM | POA: Diagnosis not present

## 2017-08-01 DIAGNOSIS — R748 Abnormal levels of other serum enzymes: Secondary | ICD-10-CM | POA: Diagnosis not present

## 2017-08-01 DIAGNOSIS — Z87891 Personal history of nicotine dependence: Secondary | ICD-10-CM

## 2017-08-01 DIAGNOSIS — R05 Cough: Secondary | ICD-10-CM | POA: Diagnosis not present

## 2017-08-01 DIAGNOSIS — N183 Chronic kidney disease, stage 3 (moderate): Secondary | ICD-10-CM | POA: Diagnosis not present

## 2017-08-01 DIAGNOSIS — I482 Chronic atrial fibrillation, unspecified: Secondary | ICD-10-CM

## 2017-08-01 DIAGNOSIS — H9190 Unspecified hearing loss, unspecified ear: Secondary | ICD-10-CM | POA: Diagnosis not present

## 2017-08-01 DIAGNOSIS — E782 Mixed hyperlipidemia: Secondary | ICD-10-CM | POA: Diagnosis present

## 2017-08-01 DIAGNOSIS — I129 Hypertensive chronic kidney disease with stage 1 through stage 4 chronic kidney disease, or unspecified chronic kidney disease: Secondary | ICD-10-CM | POA: Diagnosis not present

## 2017-08-01 DIAGNOSIS — R0602 Shortness of breath: Secondary | ICD-10-CM

## 2017-08-01 DIAGNOSIS — Z7984 Long term (current) use of oral hypoglycemic drugs: Secondary | ICD-10-CM | POA: Diagnosis not present

## 2017-08-01 DIAGNOSIS — I5021 Acute systolic (congestive) heart failure: Secondary | ICD-10-CM | POA: Diagnosis not present

## 2017-08-01 DIAGNOSIS — Z8546 Personal history of malignant neoplasm of prostate: Secondary | ICD-10-CM

## 2017-08-01 DIAGNOSIS — I509 Heart failure, unspecified: Secondary | ICD-10-CM

## 2017-08-01 DIAGNOSIS — G4733 Obstructive sleep apnea (adult) (pediatric): Secondary | ICD-10-CM | POA: Diagnosis present

## 2017-08-01 DIAGNOSIS — Z8042 Family history of malignant neoplasm of prostate: Secondary | ICD-10-CM | POA: Diagnosis not present

## 2017-08-01 DIAGNOSIS — I4891 Unspecified atrial fibrillation: Secondary | ICD-10-CM | POA: Diagnosis not present

## 2017-08-01 DIAGNOSIS — R778 Other specified abnormalities of plasma proteins: Secondary | ICD-10-CM

## 2017-08-01 DIAGNOSIS — I5033 Acute on chronic diastolic (congestive) heart failure: Secondary | ICD-10-CM

## 2017-08-01 DIAGNOSIS — I5020 Unspecified systolic (congestive) heart failure: Secondary | ICD-10-CM | POA: Diagnosis not present

## 2017-08-01 DIAGNOSIS — E1122 Type 2 diabetes mellitus with diabetic chronic kidney disease: Secondary | ICD-10-CM

## 2017-08-01 DIAGNOSIS — Z79899 Other long term (current) drug therapy: Secondary | ICD-10-CM | POA: Diagnosis not present

## 2017-08-01 DIAGNOSIS — Z87442 Personal history of urinary calculi: Secondary | ICD-10-CM

## 2017-08-01 DIAGNOSIS — I480 Paroxysmal atrial fibrillation: Secondary | ICD-10-CM | POA: Diagnosis present

## 2017-08-01 DIAGNOSIS — Z7901 Long term (current) use of anticoagulants: Secondary | ICD-10-CM | POA: Diagnosis not present

## 2017-08-01 DIAGNOSIS — R0609 Other forms of dyspnea: Secondary | ICD-10-CM | POA: Diagnosis not present

## 2017-08-01 DIAGNOSIS — I248 Other forms of acute ischemic heart disease: Secondary | ICD-10-CM | POA: Diagnosis not present

## 2017-08-01 DIAGNOSIS — I130 Hypertensive heart and chronic kidney disease with heart failure and stage 1 through stage 4 chronic kidney disease, or unspecified chronic kidney disease: Secondary | ICD-10-CM | POA: Diagnosis not present

## 2017-08-01 DIAGNOSIS — N189 Chronic kidney disease, unspecified: Secondary | ICD-10-CM | POA: Diagnosis not present

## 2017-08-01 DIAGNOSIS — E1169 Type 2 diabetes mellitus with other specified complication: Secondary | ICD-10-CM | POA: Diagnosis present

## 2017-08-01 DIAGNOSIS — I10 Essential (primary) hypertension: Secondary | ICD-10-CM | POA: Diagnosis not present

## 2017-08-01 DIAGNOSIS — N1832 Chronic kidney disease, stage 3b: Secondary | ICD-10-CM

## 2017-08-01 DIAGNOSIS — R059 Cough, unspecified: Secondary | ICD-10-CM | POA: Diagnosis present

## 2017-08-01 DIAGNOSIS — E669 Obesity, unspecified: Secondary | ICD-10-CM | POA: Diagnosis present

## 2017-08-01 DIAGNOSIS — Z6834 Body mass index (BMI) 34.0-34.9, adult: Secondary | ICD-10-CM

## 2017-08-01 LAB — CBC WITH DIFFERENTIAL/PLATELET
Basophils Absolute: 0 10*3/uL (ref 0–0.1)
Basophils Relative: 1 %
Eosinophils Absolute: 0.2 10*3/uL (ref 0–0.7)
Eosinophils Relative: 3 %
HCT: 41.4 % (ref 40.0–52.0)
Hemoglobin: 13.4 g/dL (ref 13.0–18.0)
Lymphocytes Relative: 31 %
Lymphs Abs: 1.6 10*3/uL (ref 1.0–3.6)
MCH: 29.3 pg (ref 26.0–34.0)
MCHC: 32.3 g/dL (ref 32.0–36.0)
MCV: 90.8 fL (ref 80.0–100.0)
Monocytes Absolute: 0.8 10*3/uL (ref 0.2–1.0)
Monocytes Relative: 15 %
Neutro Abs: 2.7 10*3/uL (ref 1.4–6.5)
Neutrophils Relative %: 50 %
Platelets: 173 10*3/uL (ref 150–440)
RBC: 4.56 MIL/uL (ref 4.40–5.90)
RDW: 15.9 % — ABNORMAL HIGH (ref 11.5–14.5)
WBC: 5.3 10*3/uL (ref 3.8–10.6)

## 2017-08-01 LAB — COMPREHENSIVE METABOLIC PANEL
ALT: 25 U/L (ref 0–44)
AST: 21 U/L (ref 15–41)
Albumin: 3.3 g/dL — ABNORMAL LOW (ref 3.5–5.0)
Alkaline Phosphatase: 94 U/L (ref 38–126)
Anion gap: 7 (ref 5–15)
BUN: 31 mg/dL — ABNORMAL HIGH (ref 8–23)
CO2: 21 mmol/L — ABNORMAL LOW (ref 22–32)
Calcium: 8.6 mg/dL — ABNORMAL LOW (ref 8.9–10.3)
Chloride: 112 mmol/L — ABNORMAL HIGH (ref 98–111)
Creatinine, Ser: 2.13 mg/dL — ABNORMAL HIGH (ref 0.61–1.24)
GFR calc Af Amer: 32 mL/min — ABNORMAL LOW (ref >60)
GFR calc non Af Amer: 27 mL/min — ABNORMAL LOW (ref >60)
Glucose, Bld: 81 mg/dL (ref 70–99)
Potassium: 4.3 mmol/L (ref 3.5–5.1)
Sodium: 140 mmol/L (ref 135–145)
Total Bilirubin: 0.5 mg/dL (ref 0.3–1.2)
Total Protein: 7 g/dL (ref 6.5–8.1)

## 2017-08-01 LAB — TROPONIN I: Troponin I: 0.05 ng/mL (ref <0.03)

## 2017-08-01 LAB — BRAIN NATRIURETIC PEPTIDE: B Natriuretic Peptide: 229 pg/mL — ABNORMAL HIGH (ref 0.0–100.0)

## 2017-08-01 LAB — MAGNESIUM: Magnesium: 2 mg/dL (ref 1.7–2.4)

## 2017-08-01 MED ORDER — IPRATROPIUM-ALBUTEROL 0.5-2.5 (3) MG/3ML IN SOLN
3.0000 mL | Freq: Once | RESPIRATORY_TRACT | Status: AC
  Administered 2017-08-01: 3 mL via RESPIRATORY_TRACT
  Filled 2017-08-01: qty 3

## 2017-08-01 MED ORDER — SODIUM CHLORIDE 0.9 % IV BOLUS
500.0000 mL | Freq: Once | INTRAVENOUS | Status: AC
  Administered 2017-08-01: 500 mL via INTRAVENOUS

## 2017-08-01 NOTE — ED Notes (Signed)
ED Provider at bedside. 

## 2017-08-01 NOTE — ED Triage Notes (Signed)
Pt arrives POV to triage with c/o cough x several weeks. Pt denies any other signs or symptoms at this time but is experiencing labored breathing at this time.

## 2017-08-01 NOTE — ED Notes (Signed)
Patient ambulated self to public bathroom, denies any dizziness or weakness.

## 2017-08-01 NOTE — ED Notes (Signed)
Date and time results received: 08/01/17 2213  Test: Troponin Critical Value: 0.05  Name of Provider Notified: Karma Greaser  Orders Received? Or Actions Taken?: None

## 2017-08-01 NOTE — ED Provider Notes (Signed)
[facility] Emergency Department Provider Note  ____________________________________________   First MD Initiated Contact with Patient 08/01/17 2053     (approximate)  I have reviewed the triage vital signs and the nursing notes.   HISTORY  Chief Complaint Cough    HPI [name] is a 84 y.o. male with medical history as listed below who of note is a former smoker but has not smoked for several decades.  He presents by private vehicle for evaluation of a persistent cough over the last 3 weeks.  Symptoms have been gradual in onset and he and his wife describe them as severe.  The cough is occasionally but inconsistently productive of sputum.  They believe that it is the result of something in there air conditioner unit at home because he seems to only have a cough and associated shortness of breath when he is at home and when he is at work the symptoms improve.  He states that he does not really exert himself so he has not noticed any association with exertion.  He denies chest pain.  He denies fever/chills, nausea, vomiting, and abdominal pain.  He says that he does get short of breath sometimes when he is coughing but otherwise he is not complaining of any shortness of breath, although it is notable that he had increased respiratory rate and effort while he was in triage.  He has no diagnosis of COPD.  He is on Eliquis for history of atrial fibrillation.   Past Medical History:  Diagnosis Date  . Diabetes ([place])   . Hearing loss   . High blood pressure   . History of bladder problems   . Kidney stones 11/22/2014    Patient Active Problem List   Diagnosis Date Noted  . Atrial fibrillation with RVR ([place]) 08/02/2017  . Prostatic hypertrophy 07/20/2017  . Dysuria 07/20/2017  . Acute gout of left ankle 04/10/2017  . Hyperpigmentation 04/10/2017  . Uncontrolled type 2 diabetes mellitus with hyperglycemia ([place]) 03/24/2017  . Encounter for general adult  medical examination with abnormal findings 03/24/2017  . Chest pain 05/04/2016  . Elevated troponin 05/04/2016  . Essential hypertension 07/06/2014  . Cough 03/21/2014  . OSA (obstructive sleep apnea) 03/21/2014  . Paroxysmal A-fib ([place]) 03/21/2014  . Hx of adenomatous colonic polyps 06/28/2013  . Liver cyst 06/28/2013  . Chronic kidney disease 06/08/2013  . Diabetes ([place]) 06/08/2013  . Hyperlipidemia, mixed 06/08/2013  . Valvular heart disease 06/08/2013    Past Surgical History:  Procedure Laterality Date  . PROSTATE CANCER    . PROSTATE SURGERY      Prior to Admission medications   Medication Sig Start Date End Date Taking? Authorizing Provider  CARTIA XT 120 MG 24 hr capsule Take 120 mg by mouth daily. 04/22/16  Yes [provider]  glipiZIDE (GLUCOTROL) 10 MG tablet Take 1 tablet (10 mg total) by mouth 2 (two) times daily. 06/04/17  Yes Boscia, Heather E, NP  lisinopril (PRINIVIL,ZESTRIL) 5 MG tablet Take 1 tablet (5 mg total) by mouth daily. 07/01/17  Yes Boscia, Greer Ee, NP  nitroGLYCERIN (NITROSTAT) 0.4 MG SL tablet Place 1 tablet (0.4 mg total) under the tongue every 5 (five) minutes as needed for chest pain. 05/05/16  Yes Theodoro Grist, MD  PRADAXA 150 MG CAPS capsule Take 150 mg by mouth 2 (two) times daily. 04/05/16  Yes [provider]  diclofenac sodium (VOLTAREN) 1 % GEL Apply 4 g topically 4 (four) times daily. Patient  not taking: Reported on 08/01/2017 05/29/17   Caryn Section Linden Dolin, PA-C  ONETOUCH VERIO test strip USE UTD TO CHECK BLOOD SUGAR ONCE D 02/03/17   Ronnell Freshwater, NP  tretinoin (RETIN-A) 0.025 % cream Apply topically at bedtime. Patient not taking: Reported on 08/01/2017 04/10/17   Ronnell Freshwater, NP    Allergies Patient has no known allergies.  Family History  Problem Relation Age of Onset  . Cancer Mother   . Diabetes Unknown   . High blood pressure Unknown   . Prostate cancer Brother     Social History Social History    Tobacco Use  . Smoking status: Former Smoker    Years: 16.00    Types: Cigarettes  . Smokeless tobacco: Never Used  Substance Use Topics  . Alcohol use: No  . Drug use: No    Review of Systems Constitutional: No fever/chills Eyes: No visual changes. ENT: No sore throat. Cardiovascular: Denies chest pain. Respiratory: Persistent cough x 3 weeks. Gastrointestinal: No abdominal pain.  No nausea, no vomiting.  No diarrhea.  No constipation. Genitourinary: Negative for dysuria. Musculoskeletal: Negative for neck pain.  Negative for back pain. Integumentary: Negative for rash. Neurological: Negative for headaches, focal weakness or numbness.   ____________________________________________   PHYSICAL EXAM:  VITAL SIGNS: ED Triage Vitals  Enc Vitals Group     BP 08/01/17 2027 111/80     Pulse Rate 08/01/17 2027 (!) 128     Resp 08/01/17 2027 (!) 26     Temp 08/01/17 2027 98.1 F (36.7 C)     Temp Source 08/01/17 2027 Oral     SpO2 08/01/17 2027 97 %     Weight --      Height --      Head Circumference --      Peak Flow --      Pain Score 08/01/17 2026 0     Pain Loc --      Pain Edu? --      Excl. in [place]? --     Constitutional: Alert and oriented. Well appearing and in no acute distress. Eyes: Conjunctivae are normal.  Head: Atraumatic. Nose: No congestion/rhinnorhea. Mouth/Throat: Mucous membranes are moist. Neck: No stridor.  No meningeal signs.   Cardiovascular: Normal rate, regular rhythm. Good peripheral circulation. Grossly normal heart sounds. Respiratory: Normal respiratory effort.  No retractions. Lungs CTAB.  Gastrointestinal: Soft and nontender. No distention.  Musculoskeletal: No lower extremity tenderness nor edema. No gross deformities of extremities. Neurologic:  Normal speech and language. No gross focal neurologic deficits are appreciated.  Skin:  Skin is warm, dry and intact. No rash noted. Psychiatric: Mood and affect are normal. Speech and  behavior are normal.  ____________________________________________   LABS (all labs ordered are listed, but only abnormal results are displayed)  Labs Reviewed  CBC WITH DIFFERENTIAL/PLATELET - Abnormal; Notable for the following components:      Result Value   RDW 15.9 (*)    All other components within normal limits  TROPONIN I - Abnormal; Notable for the following components:   Troponin I 0.05 (*)    All other components within normal limits  COMPREHENSIVE METABOLIC PANEL - Abnormal; Notable for the following components:   Chloride 112 (*)    CO2 21 (*)    BUN 31 (*)    Creatinine, Ser 2.13 (*)    Calcium 8.6 (*)    Albumin 3.3 (*)    GFR calc non Af Amer 27 (*)  GFR calc Af Amer 32 (*)    All other components within normal limits  BRAIN NATRIURETIC PEPTIDE - Abnormal; Notable for the following components:   B Natriuretic Peptide 229.0 (*)    All other components within normal limits  MAGNESIUM   ____________________________________________  EKG  ED ECG REPORT I, Hinda Kehr, the attending physician, personally viewed and interpreted this ECG.  Date: 08/01/2017 EKG Time: 20: 33 Rate: 131 Rhythm: A. fib with RVR QRS Axis: Left axis deviation Intervals: normal ST/T Wave abnormalities: Non-specific ST segment / T-wave changes, but no evidence of acute ischemia. Narrative Interpretation: no evidence of acute ischemia   ____________________________________________  RADIOLOGY I, Hinda Kehr, personally viewed and evaluated these images (plain radiographs) as part of my medical decision making, as well as reviewing the written report by the radiologist.  ED MD interpretation: No acute abnormalities identified on chest x-ray  Official radiology report(s): Dg Chest Portable 1 View  Result Date: 08/01/2017 CLINICAL DATA:  Cough for the past several weeks. Shortness of breath. EXAM: PORTABLE CHEST 1 VIEW COMPARISON:  Chest x-ray and CT chest dated May 04, 2016.  FINDINGS: Stable mild cardiomegaly. Normal pulmonary vascularity. Unchanged elevation of the left hemidiaphragm and scarring at the left lung base. No focal consolidation, pleural effusion, or pneumothorax. No acute osseous abnormality. IMPRESSION: No active disease. Electronically Signed   By: Titus Dubin M.D.   On: 08/01/2017 20:52    ____________________________________________   PROCEDURES  Critical Care performed: No   Procedure(s) performed:   Procedures   ____________________________________________   INITIAL IMPRESSION / ASSESSMENT AND PLAN / ED COURSE  As part of my medical decision making, I reviewed the following data within the [facility] notes reviewed and incorporated, Labs reviewed , EKG interpreted , Old chart reviewed, Radiograph reviewed  and Notes from prior ED visits    Differential diagnosis includes, but is not limited to, chronic bronchitis, allergic cough, viral illness, pneumonia, COPD exacerbation (although there is no known diagnosis of COPD), PE, ACS.  The patient is well-appearing in no acute distress at the time that I saw him.  He is a little bit tachycardic but is otherwise asymptomatic.  He has history of chronic kidney disease with a last recorded creatinine of 2.  He is on Eliquis for history of atrial fibrillation.  His symptoms have been present for 3 weeks and he and his wife are frustrated at the persistent cough but it is not causing him any significant distress at least while at rest.  It sounds as if he becomes more short of breath with ambulation such as when he was a bleeding to triage but the patient reports that he is not very active at baseline his wife confirms this.  I will perform a broad laboratory evaluation but the patient's chest x-ray and EKG are both reassuring.  We will also try a DuoNeb to see if this helps with the coughing spasms and the shortness of breath but his lung sounds are clear and this does not  sound consistent with COPD.  PE is possible given the shortness of breath, worse with exertion, tachycardia, and even the mild cough, but is unlikely particularly given that he is already on anticoagulation.  Additionally his chronic kidney disease make it unlikely that he would be a good candidate for a CTA for a rule out and would instead require a V/Q study which is not obtainable at night.  Proptosis is possible but unlikely and is a  send out study regardless.  We will perform the laboratory evaluation and monitoring for period of time to see if his dyspnea returns or if his coughing episodes get worse and if he has any benefit from a breathing treatment.    (Note that documentation was delayed due to multiple ED patients requiring immediate care.)  Lab results are notable for an elevated troponin of 0.05; this is likely multifactorial, due to his chronic kidney disease but also possibly indicating an element of demand ischemia.  BNP is slightly elevated to 29 with no prior recorded history of CHF.  His chest x-ray is clear with no evidence of pulmonary edema but this could be suggestive of some diastolic heart failure.  His CBC is within normal limits.  CMP is most notable for a creatinine at 2.1 which is only very slightly elevated over his baseline.  I have given him a small fluid bolus for his tachycardia and also feeling like his increased work of breathing, tachypnea, and cough and lead to increased insensible losses which could explain his tachycardia.  However, after the fluid bolus and while the patient is fast asleep his heart rate was remaining right around 1 10-1 15.  When he woke up he had several coughing fits that I witnessed that do not sound consistent with pertussis but which were quite severe and completely took his breath away during which time his heart rate went up into the 130s but then back down again to just over 100.  I discussed outpatient follow-up after a second troponin with  the patient and his wife but they are concerned about it being Friday evening and with the degree of difficulty is having with his shortness of breath.  While I was watching him after his coughing episodes he did have increased work of breathing and some mild retractions even though his lungs remain clear.  I called and spoke with the hospitalist, Dr. Jannifer Franklin, and discussed the case.  He will bring him into the hospital for further evaluation from a cardiac/CHF standpoint as well as possibly further evaluating for PE.  The patient and his wife understand and agree with the plan     ____________________________________________  FINAL CLINICAL IMPRESSION(S) / ED DIAGNOSES  Final diagnoses:  SOB (shortness of breath) on exertion  Dyspnea on exertion  Cough  Chronic atrial fibrillation (HCC)  Elevated troponin I level  Chronic kidney disease, unspecified CKD stage  Demand ischemia ([place])     MEDICATIONS GIVEN DURING THIS VISIT:  Medications  guaiFENesin-dextromethorphan (ROBITUSSIN DM) 100-10 MG/5ML syrup 5 mL (has no administration in time range)  benzonatate (TESSALON) capsule 200 mg (has no administration in time range)  ipratropium-albuterol (DUONEB) 0.5-2.5 (3) MG/3ML nebulizer solution 3 mL (3 mLs Nebulization Given 08/01/17 2118)  sodium chloride 0.9 % bolus 500 mL (0 mLs Intravenous Stopped 08/01/17 2232)     ED Discharge Orders    None       Note:  This document was prepared using Dragon voice recognition software and may include unintentional dictation errors.    Hinda Kehr, MD 08/02/17 [phone]

## 2017-08-02 ENCOUNTER — Encounter: Payer: Self-pay | Admitting: Internal Medicine

## 2017-08-02 ENCOUNTER — Inpatient Hospital Stay
Admit: 2017-08-02 | Discharge: 2017-08-02 | Disposition: A | Discharge status: Home / Self Care | Payer: Medicare Other | Attending: Internal Medicine | Admitting: Internal Medicine

## 2017-08-02 DIAGNOSIS — G4733 Obstructive sleep apnea (adult) (pediatric): Secondary | ICD-10-CM | POA: Diagnosis present

## 2017-08-02 DIAGNOSIS — Z7901 Long term (current) use of anticoagulants: Secondary | ICD-10-CM | POA: Diagnosis not present

## 2017-08-02 DIAGNOSIS — I4891 Unspecified atrial fibrillation: Secondary | ICD-10-CM | POA: Diagnosis present

## 2017-08-02 DIAGNOSIS — I509 Heart failure, unspecified: Secondary | ICD-10-CM

## 2017-08-02 DIAGNOSIS — Z6834 Body mass index (BMI) 34.0-34.9, adult: Secondary | ICD-10-CM | POA: Diagnosis not present

## 2017-08-02 DIAGNOSIS — I482 Chronic atrial fibrillation: Secondary | ICD-10-CM | POA: Diagnosis present

## 2017-08-02 DIAGNOSIS — I130 Hypertensive heart and chronic kidney disease with heart failure and stage 1 through stage 4 chronic kidney disease, or unspecified chronic kidney disease: Secondary | ICD-10-CM | POA: Diagnosis present

## 2017-08-02 DIAGNOSIS — I248 Other forms of acute ischemic heart disease: Secondary | ICD-10-CM | POA: Diagnosis present

## 2017-08-02 DIAGNOSIS — H9190 Unspecified hearing loss, unspecified ear: Secondary | ICD-10-CM | POA: Diagnosis present

## 2017-08-02 DIAGNOSIS — I5033 Acute on chronic diastolic (congestive) heart failure: Secondary | ICD-10-CM

## 2017-08-02 DIAGNOSIS — Z79899 Other long term (current) drug therapy: Secondary | ICD-10-CM | POA: Diagnosis not present

## 2017-08-02 DIAGNOSIS — I10 Essential (primary) hypertension: Secondary | ICD-10-CM | POA: Diagnosis not present

## 2017-08-02 DIAGNOSIS — N183 Chronic kidney disease, stage 3 (moderate): Secondary | ICD-10-CM | POA: Diagnosis present

## 2017-08-02 DIAGNOSIS — R079 Chest pain, unspecified: Secondary | ICD-10-CM

## 2017-08-02 DIAGNOSIS — I5021 Acute systolic (congestive) heart failure: Secondary | ICD-10-CM | POA: Diagnosis not present

## 2017-08-02 DIAGNOSIS — R7989 Other specified abnormal findings of blood chemistry: Secondary | ICD-10-CM

## 2017-08-02 DIAGNOSIS — I480 Paroxysmal atrial fibrillation: Secondary | ICD-10-CM | POA: Diagnosis present

## 2017-08-02 DIAGNOSIS — E782 Mixed hyperlipidemia: Secondary | ICD-10-CM | POA: Diagnosis present

## 2017-08-02 DIAGNOSIS — Z8042 Family history of malignant neoplasm of prostate: Secondary | ICD-10-CM | POA: Diagnosis not present

## 2017-08-02 DIAGNOSIS — R05 Cough: Secondary | ICD-10-CM | POA: Diagnosis present

## 2017-08-02 DIAGNOSIS — Z87891 Personal history of nicotine dependence: Secondary | ICD-10-CM | POA: Diagnosis not present

## 2017-08-02 DIAGNOSIS — Z87442 Personal history of urinary calculi: Secondary | ICD-10-CM | POA: Diagnosis not present

## 2017-08-02 DIAGNOSIS — E669 Obesity, unspecified: Secondary | ICD-10-CM | POA: Diagnosis present

## 2017-08-02 DIAGNOSIS — R748 Abnormal levels of other serum enzymes: Secondary | ICD-10-CM | POA: Diagnosis not present

## 2017-08-02 DIAGNOSIS — Z7984 Long term (current) use of oral hypoglycemic drugs: Secondary | ICD-10-CM | POA: Diagnosis not present

## 2017-08-02 DIAGNOSIS — R778 Other specified abnormalities of plasma proteins: Secondary | ICD-10-CM

## 2017-08-02 DIAGNOSIS — Z8546 Personal history of malignant neoplasm of prostate: Secondary | ICD-10-CM | POA: Diagnosis not present

## 2017-08-02 DIAGNOSIS — E1122 Type 2 diabetes mellitus with diabetic chronic kidney disease: Secondary | ICD-10-CM | POA: Diagnosis present

## 2017-08-02 LAB — TROPONIN I
Troponin I: 0.04 ng/mL (ref <0.03)
Troponin I: 0.04 ng/mL (ref <0.03)
Troponin I: 0.04 ng/mL (ref <0.03)

## 2017-08-02 LAB — GLUCOSE, CAPILLARY
Glucose-Capillary: 105 mg/dL — ABNORMAL HIGH (ref 70–99)
Glucose-Capillary: 61 mg/dL — ABNORMAL LOW (ref 70–99)
Glucose-Capillary: 62 mg/dL — ABNORMAL LOW (ref 70–99)
Glucose-Capillary: 89 mg/dL (ref 70–99)
Glucose-Capillary: 93 mg/dL (ref 70–99)
Glucose-Capillary: 87 mg/dL (ref 70–99)

## 2017-08-02 LAB — BASIC METABOLIC PANEL
Anion gap: 7 (ref 5–15)
BUN: 28 mg/dL — ABNORMAL HIGH (ref 8–23)
CO2: 23 mmol/L (ref 22–32)
Calcium: 8.5 mg/dL — ABNORMAL LOW (ref 8.9–10.3)
Chloride: 111 mmol/L (ref 98–111)
Creatinine, Ser: 2.06 mg/dL — ABNORMAL HIGH (ref 0.61–1.24)
GFR calc Af Amer: 33 mL/min — ABNORMAL LOW (ref >60)
GFR calc non Af Amer: 28 mL/min — ABNORMAL LOW (ref >60)
Glucose, Bld: 129 mg/dL — ABNORMAL HIGH (ref 70–99)
Potassium: 4 mmol/L (ref 3.5–5.1)
Sodium: 141 mmol/L (ref 135–145)

## 2017-08-02 LAB — ECHOCARDIOGRAM COMPLETE
Height: 72 in
Weight: 4102.4 oz

## 2017-08-02 LAB — CBC
HCT: 42.1 % (ref 40.0–52.0)
Hemoglobin: 13.6 g/dL (ref 13.0–18.0)
MCH: 29.6 pg (ref 26.0–34.0)
MCHC: 32.2 g/dL (ref 32.0–36.0)
MCV: 91.8 fL (ref 80.0–100.0)
Platelets: 161 10*3/uL (ref 150–440)
RBC: 4.59 MIL/uL (ref 4.40–5.90)
RDW: 15.8 % — ABNORMAL HIGH (ref 11.5–14.5)
WBC: 5.4 10*3/uL (ref 3.8–10.6)

## 2017-08-02 MED ORDER — CARVEDILOL 3.125 MG PO TABS
3.1250 mg | ORAL_TABLET | Freq: Once | ORAL | Status: AC
  Administered 2017-08-02: 3.125 mg via ORAL
  Filled 2017-08-02: qty 1

## 2017-08-02 MED ORDER — DILTIAZEM HCL ER COATED BEADS 120 MG PO CP24
120.0000 mg | ORAL_CAPSULE | Freq: Every day | ORAL | Status: DC
  Administered 2017-08-02: 120 mg via ORAL
  Filled 2017-08-02: qty 1

## 2017-08-02 MED ORDER — CARVEDILOL 6.25 MG PO TABS
6.2500 mg | ORAL_TABLET | Freq: Two times a day (BID) | ORAL | Status: DC
  Administered 2017-08-03 – 2017-08-04 (×3): 6.25 mg via ORAL
  Filled 2017-08-02 (×2): qty 1

## 2017-08-02 MED ORDER — INSULIN ASPART 100 UNIT/ML ~~LOC~~ SOLN: 0.0000 [IU] | Freq: Every day | SUBCUTANEOUS | Status: DC

## 2017-08-02 MED ORDER — ONDANSETRON HCL 4 MG PO TABS: 4.0000 mg | ORAL_TABLET | Freq: Four times a day (QID) | ORAL | Status: DC | PRN

## 2017-08-02 MED ORDER — LISINOPRIL 5 MG PO TABS
5.0000 mg | ORAL_TABLET | Freq: Every day | ORAL | Status: DC
  Administered 2017-08-02 – 2017-08-04 (×3): 5 mg via ORAL
  Filled 2017-08-02 (×3): qty 1

## 2017-08-02 MED ORDER — INSULIN ASPART 100 UNIT/ML ~~LOC~~ SOLN: 0.0000 [IU] | Freq: Three times a day (TID) | SUBCUTANEOUS | Status: DC

## 2017-08-02 MED ORDER — IPRATROPIUM-ALBUTEROL 0.5-2.5 (3) MG/3ML IN SOLN: 3.0000 mL | RESPIRATORY_TRACT | Status: DC | PRN

## 2017-08-02 MED ORDER — ONDANSETRON HCL 4 MG/2ML IJ SOLN: 4.0000 mg | Freq: Four times a day (QID) | INTRAMUSCULAR | Status: DC | PRN

## 2017-08-02 MED ORDER — ACETAMINOPHEN 650 MG RE SUPP: 650.0000 mg | Freq: Four times a day (QID) | RECTAL | Status: DC | PRN

## 2017-08-02 MED ORDER — PERFLUTREN LIPID MICROSPHERE
1.0000 mL | INTRAVENOUS | Status: AC | PRN
  Administered 2017-08-02: 6.5 mL via INTRAVENOUS
  Filled 2017-08-02: qty 10

## 2017-08-02 MED ORDER — HYDROCOD POLST-CPM POLST ER 10-8 MG/5ML PO SUER
5.0000 mL | Freq: Two times a day (BID) | ORAL | Status: DC | PRN
  Administered 2017-08-02 – 2017-08-03 (×2): 5 mL via ORAL
  Filled 2017-08-02 (×3): qty 5

## 2017-08-02 MED ORDER — GUAIFENESIN-DM 100-10 MG/5ML PO SYRP
5.0000 mL | ORAL_SOLUTION | ORAL | Status: DC | PRN
  Administered 2017-08-02: 5 mL via ORAL
  Filled 2017-08-02: qty 5

## 2017-08-02 MED ORDER — DABIGATRAN ETEXILATE MESYLATE 150 MG PO CAPS
150.0000 mg | ORAL_CAPSULE | Freq: Two times a day (BID) | ORAL | Status: DC
  Administered 2017-08-02 – 2017-08-04 (×5): 150 mg via ORAL
  Filled 2017-08-02 (×6): qty 1

## 2017-08-02 MED ORDER — ATORVASTATIN CALCIUM 20 MG PO TABS
40.0000 mg | ORAL_TABLET | Freq: Every day | ORAL | Status: DC
  Administered 2017-08-02 – 2017-08-03 (×2): 40 mg via ORAL
  Filled 2017-08-02 (×2): qty 2

## 2017-08-02 MED ORDER — AZITHROMYCIN 500 MG PO TABS
500.0000 mg | ORAL_TABLET | Freq: Every day | ORAL | Status: DC
  Administered 2017-08-02 – 2017-08-04 (×3): 500 mg via ORAL
  Filled 2017-08-02 (×2): qty 1
  Filled 2017-08-02: qty 2
  Filled 2017-08-02: qty 1
  Filled 2017-08-02 (×2): qty 2

## 2017-08-02 MED ORDER — CARVEDILOL 3.125 MG PO TABS
3.1250 mg | ORAL_TABLET | Freq: Two times a day (BID) | ORAL | Status: DC
Start: 2017-08-02 — End: 2017-08-03
  Administered 2017-08-02: 3.125 mg via ORAL
  Filled 2017-08-02: qty 1

## 2017-08-02 MED ORDER — FUROSEMIDE 10 MG/ML IJ SOLN
20.0000 mg | Freq: Two times a day (BID) | INTRAMUSCULAR | Status: DC
  Administered 2017-08-02 – 2017-08-03 (×4): 20 mg via INTRAVENOUS
  Filled 2017-08-02 (×4): qty 2

## 2017-08-02 MED ORDER — ACETAMINOPHEN 325 MG PO TABS
650.0000 mg | ORAL_TABLET | Freq: Four times a day (QID) | ORAL | Status: DC | PRN
  Administered 2017-08-02: 650 mg via ORAL
  Filled 2017-08-02: qty 2

## 2017-08-02 MED ORDER — BENZONATATE 100 MG PO CAPS: 200.0000 mg | ORAL_CAPSULE | Freq: Three times a day (TID) | ORAL | Status: DC | PRN

## 2017-08-02 NOTE — Progress Notes (Signed)
Hypoglycemic Event  CBG: 62  Treatment: 4 oz orange juice and lunch  Symptoms: asymptomatic  Follow-up CBG: Time:1318 CBG Result:89  Possible Reasons for Event:   Comments/MD notified:     Margarita Mail

## 2017-08-02 NOTE — Progress Notes (Signed)
Advanced Care Plan.  Purpose of Encounter: CODE STATUS. Parties in Attendance: The patient, his wife and me. Patient's Decisional Capacity: Yes. Medical Story: [name] a84 y.o.malewith history of hypertension, diabetes, A. fib and CKD, who presents with persistent cough shortness of breath on exertion for the past 2 to 3 weeks.  He is suspected acute systolic CHF with ejection fraction 35 to 40%.  He has multiple medical problem and a new onset of CHF.  He has high risk for cardiopulmonary arrest.  I discussed with the patient about his current condition, prognosis and CODE STATUS.  The patient stated that he wants to be resuscitated and intubated to get him back if necessary. Plan:  Code Status: Full code. Time spent discussing advance care planning: 18 minutes.

## 2017-08-02 NOTE — Progress Notes (Signed)
MD [facility] notified for patient in 42 having a few runs of PVCs, 1.88 pause, and 3 bts of VT per [place] orders an increase in coreg dosage. I will continue to assess.

## 2017-08-02 NOTE — Consult Note (Signed)
Reason for Consult: Cough congestion possible heart failure Referring Physician: Dr. Lance Coon hospitalist Cardiologist Dr. Margarita Rana is an 84 y.o. male.  HPI: Patient is a 84 year old white male complains of persistent cough congestion over the last 2 to 3 weeks patient had some mild leg edema denies any chest pain patient has had borderline elevated troponins elevated BNP.Marland Kitchen  Patient was concerned about mold in his house.  Denies any significant chest pain has history of atrial fibrillation on Pradaxa patient now feels reasonably well but still has had some cough nonproductive no fever chills or sweats now here for further assessment and evaluation  Past Medical History:  Diagnosis Date  . Diabetes ([place])   . Hearing loss   . High blood pressure   . History of bladder problems   . Kidney stones 11/22/2014    Past Surgical History:  Procedure Laterality Date  . PROSTATE CANCER    . PROSTATE SURGERY      Family History  Problem Relation Age of Onset  . Cancer Mother   . Diabetes Unknown   . High blood pressure Unknown   . Prostate cancer Brother     Social History:  reports that he has quit smoking. His smoking use included cigarettes. He quit after 16.00 years of use. He has never used smokeless tobacco. He reports that he does not drink alcohol or use drugs.  Allergies: No Known Allergies  Medications: I have reviewed the patient's current medications.  Results for orders placed or performed during the hospital encounter of 08/01/17 (from the past 48 hour(s))  CBC with Differential/Platelet     Status: Abnormal   Collection Time: 08/01/17  9:08 PM  Result Value Ref Range   WBC 5.3 3.8 - 10.6 K/uL   RBC 4.56 4.40 - 5.90 MIL/uL   Hemoglobin 13.4 13.0 - 18.0 g/dL   HCT 41.4 40.0 - 52.0 %   MCV 90.8 80.0 - 100.0 fL   MCH 29.3 26.0 - 34.0 pg   MCHC 32.3 32.0 - 36.0 g/dL   RDW 15.9 (H) 11.5 - 14.5 %   Platelets 173 150 - 440 K/uL   Neutrophils Relative %  50 %   Neutro Abs 2.7 1.4 - 6.5 K/uL   Lymphocytes Relative 31 %   Lymphs Abs 1.6 1.0 - 3.6 K/uL   Monocytes Relative 15 %   Monocytes Absolute 0.8 0.2 - 1.0 K/uL   Eosinophils Relative 3 %   Eosinophils Absolute 0.2 0 - 0.7 K/uL   Basophils Relative 1 %   Basophils Absolute 0.0 0 - 0.1 K/uL    Comment: Performed at [facility], [address] [place], [place] [address]  Troponin I     Status: Abnormal   Collection Time: 08/01/17  9:08 PM  Result Value Ref Range   Troponin I 0.05 (HH) <0.03 ng/mL    Comment: CRITICAL RESULT CALLED TO, READ BACK BY AND VERIFIED WITH DAVID WALKER AT 2212 08/01/17.PMH Performed at [facility], [address] [place], [place] [address]   Magnesium     Status: None   Collection Time: 08/01/17  9:08 PM  Result Value Ref Range   Magnesium 2.0 1.7 - 2.4 mg/dL    Comment: Performed at [facility], [place], [address] [place], [place] [address]  Comprehensive metabolic panel     Status: Abnormal   Collection Time: 08/01/17  9:08 PM  Result Value Ref Range   Sodium 140 135 - 145  mmol/L   Potassium 4.3 3.5 - 5.1 mmol/L   Chloride 112 (H) 98 - 111 mmol/L    Comment: Please note change in reference range.   CO2 21 (L) 22 - 32 mmol/L   Glucose, Bld 81 70 - 99 mg/dL    Comment: Please note change in reference range.   BUN 31 (H) 8 - 23 mg/dL    Comment: Please note change in reference range.   Creatinine, Ser 2.13 (H) 0.61 - 1.24 mg/dL   Calcium 8.6 (L) 8.9 - 10.3 mg/dL   Total Protein 7.0 6.5 - 8.1 g/dL   Albumin 3.3 (L) 3.5 - 5.0 g/dL   AST 21 15 - 41 U/L   ALT 25 0 - 44 U/L    Comment: Please note change in reference range.   Alkaline Phosphatase 94 38 - 126 U/L   Total Bilirubin 0.5 0.3 - 1.2 mg/dL   GFR calc non Af Amer 27 (L) >60 mL/min   GFR calc Af Amer 32 (L) >60 mL/min    Comment: (NOTE) The eGFR has been calculated using the CKD EPI equation. This calculation has not been validated in all clinical  situations. eGFR's persistently <60 mL/min signify possible Chronic Kidney Disease.    Anion gap 7 5 - 15    Comment: Performed at [facility], [address] [place], [place] [address]  Brain natriuretic peptide     Status: Abnormal   Collection Time: 08/01/17  9:08 PM  Result Value Ref Range   B Natriuretic Peptide 229.0 (H) 0.0 - 100.0 pg/mL    Comment: Performed at [facility], [address] [place], [place] [address]  Troponin I     Status: Abnormal   Collection Time: 08/02/17  3:22 AM  Result Value Ref Range   Troponin I 0.04 (HH) <0.03 ng/mL    Comment: CRITICAL VALUE NOTED. VALUE IS CONSISTENT WITH PREVIOUSLY REPORTED/CALLED VALUE.PMH Performed at [facility], [address] [place], [place] [address]   Basic metabolic panel     Status: Abnormal   Collection Time: 08/02/17  3:22 AM  Result Value Ref Range   Sodium 141 135 - 145 mmol/L   Potassium 4.0 3.5 - 5.1 mmol/L   Chloride 111 98 - 111 mmol/L    Comment: Please note change in reference range.   CO2 23 22 - 32 mmol/L   Glucose, Bld 129 (H) 70 - 99 mg/dL    Comment: Please note change in reference range.   BUN 28 (H) 8 - 23 mg/dL    Comment: Please note change in reference range.   Creatinine, Ser 2.06 (H) 0.61 - 1.24 mg/dL   Calcium 8.5 (L) 8.9 - 10.3 mg/dL   GFR calc non Af Amer 28 (L) >60 mL/min   GFR calc Af Amer 33 (L) >60 mL/min    Comment: (NOTE) The eGFR has been calculated using the CKD EPI equation. This calculation has not been validated in all clinical situations. eGFR's persistently <60 mL/min signify possible Chronic Kidney Disease.    Anion gap 7 5 - 15    Comment: Performed at [facility], [address] [place],  [address]  CBC     Status: Abnormal   Collection Time: 08/02/17  3:22 AM  Result Value Ref Range   WBC 5.4 3.8 - 10.6 K/uL   RBC 4.59 4.40 - 5.90 MIL/uL   Hemoglobin 13.6 13.0 - 18.0 g/dL   HCT 42.1 40.0 - 52.0 %  MCV 91.8  80.0 - 100.0 fL   MCH 29.6 26.0 - 34.0 pg   MCHC 32.2 32.0 - 36.0 g/dL   RDW 15.8 (H) 11.5 - 14.5 %   Platelets 161 150 - 440 K/uL    Comment: Performed at [facility], [address] [place], [place] [address]  Troponin I     Status: Abnormal   Collection Time: 08/02/17  7:56 AM  Result Value Ref Range   Troponin I 0.04 (HH) <0.03 ng/mL    Comment: CRITICAL VALUE NOTED. VALUE IS CONSISTENT WITH PREVIOUSLY REPORTED/CALLED VALUE / [place] Performed at [facility], [address] [place], [place] [address]   Glucose, capillary     Status: Abnormal   Collection Time: 08/02/17  7:59 AM  Result Value Ref Range   Glucose-Capillary 61 (L) 70 - 99 mg/dL  Glucose, capillary     Status: Abnormal   Collection Time: 08/02/17  8:22 AM  Result Value Ref Range   Glucose-Capillary 105 (H) 70 - 99 mg/dL    Dg Chest Portable 1 View  Result Date: 08/01/2017 CLINICAL DATA:  Cough for the past several weeks. Shortness of breath. EXAM: PORTABLE CHEST 1 VIEW COMPARISON:  Chest x-ray and CT chest dated May 04, 2016. FINDINGS: Stable mild cardiomegaly. Normal pulmonary vascularity. Unchanged elevation of the left hemidiaphragm and scarring at the left lung base. No focal consolidation, pleural effusion, or pneumothorax. No acute osseous abnormality. IMPRESSION: No active disease. Electronically Signed   By: Titus Dubin M.D.   On: 08/01/2017 20:52    Review of Systems  Constitutional: Positive for malaise/fatigue.  HENT: Positive for congestion.   Eyes: Negative.   Respiratory: Positive for cough and shortness of breath.   Cardiovascular: Positive for orthopnea, leg swelling and PND.  Gastrointestinal: Negative.   Genitourinary: Negative.   Musculoskeletal: Negative.   Skin: Negative.   Neurological: Negative.   Endo/Heme/Allergies: Negative.   Psychiatric/Behavioral: Negative.    Blood pressure 99/72, pulse (!) 59, temperature (!) 97.4 F (36.3 C), temperature source Oral,  resp. rate 18, height 6' (1.829 m), weight 256 lb 6.4 oz (116.3 kg), SpO2 98 %. Physical Exam  Nursing note and vitals reviewed. Constitutional: He is oriented to person, place, and time. He appears well-developed and well-nourished.  HENT:  Head: Normocephalic and atraumatic.  Eyes: Pupils are equal, round, and reactive to light. Conjunctivae and EOM are normal.  Neck: Normal range of motion. Neck supple.  Cardiovascular: Normal rate and regular rhythm.  Murmur heard. Respiratory: Effort normal and breath sounds normal.  GI: Soft. Bowel sounds are normal.  Musculoskeletal: Normal range of motion. He exhibits edema.  Neurological: He is alert and oriented to person, place, and time. He has normal reflexes.  Skin: Skin is warm and dry.  Psychiatric: He has a normal mood and affect.    Assessment/Plan: Cough Congestion Diabetes Hypertension Obesity Elevated BNP Possible heart failure Atrial fibrillation Former smoker Borderline diabetes Renal insufficiency . Plan Recommend possible inhalers to help with cough Consider diuretic therapy for possible heart failure Recommend echocardiogram for further evaluation Follow-up borderline troponin Continue Pradaxa for atrial fibrillation Continue glipizide for diabetes Agree with ACE inhibitor therapy Consider follow-up with nephrology Continue therapy for hyperlipidemia   Dwayne D Callwood 08/02/2017, 12:08 PM

## 2017-08-02 NOTE — Progress Notes (Signed)
[facility] [place] NAME: [name]    MR#:  [id number]  DATE OF BIRTH:  01-04-1934  SUBJECTIVE:  CHIEF COMPLAINT:   Chief Complaint  Patient presents with  . Cough   Persistent cough and shortness of breath on exertion. REVIEW OF SYSTEMS:  Review of Systems  Constitutional: Positive for malaise/fatigue. Negative for chills and fever.  HENT: Negative for sore throat.   Eyes: Negative for blurred vision and double vision.  Respiratory: Positive for cough, sputum production and shortness of breath. Negative for hemoptysis, wheezing and stridor.   Cardiovascular: Positive for leg swelling. Negative for chest pain, palpitations and orthopnea.  Gastrointestinal: Negative for abdominal pain, blood in stool, diarrhea, melena, nausea and vomiting.  Genitourinary: Negative for dysuria, flank pain and hematuria.  Musculoskeletal: Negative for back pain and joint pain.  Skin: Negative for rash.  Neurological: Negative for dizziness, sensory change, focal weakness, seizures, loss of consciousness, weakness and headaches.  Endo/Heme/Allergies: Negative for polydipsia.  Psychiatric/Behavioral: Negative for depression. The patient is not nervous/anxious.     DRUG ALLERGIES:  No Known Allergies VITALS:  Blood pressure 125/80, pulse (!) 53, temperature (!) 97.4 F (36.3 C), temperature source Oral, resp. rate 18, height 6' (1.829 m), weight 256 lb 6.4 oz (116.3 kg), SpO2 97 %. PHYSICAL EXAMINATION:  Physical Exam  Constitutional: He is oriented to person, place, and time. He appears well-developed.  Obesity.  HENT:  Head: Normocephalic.  Mouth/Throat: Oropharynx is clear and moist.  Eyes: Pupils are equal, round, and reactive to light. Conjunctivae and EOM are normal. No scleral icterus.  Neck: Normal range of motion. Neck supple. No JVD present. No tracheal deviation present.  Cardiovascular: Normal rate, regular rhythm and normal heart  sounds. Exam reveals no gallop.  No murmur heard. Pulmonary/Chest: Effort normal. No respiratory distress. He has no wheezes. He has rales.  Bilateral basilar rales.  Abdominal: Soft. Bowel sounds are normal. He exhibits no distension. There is no tenderness. There is no rebound.  Musculoskeletal: Normal range of motion. He exhibits edema. He exhibits no tenderness.  Bilateral leg edema.  Neurological: He is alert and oriented to person, place, and time. No cranial nerve deficit.  Skin: No rash noted. No erythema.  Psychiatric: He has a normal mood and affect.  Vitals reviewed.  LABORATORY PANEL:  Male CBC Recent Labs  Lab 08/02/17 0322  WBC 5.4  HGB 13.6  HCT 42.1  PLT 161   ------------------------------------------------------------------------------------------------------------------ Chemistries  Recent Labs  Lab 08/01/17 2108 08/02/17 0322  NA 140 141  K 4.3 4.0  CL 112* 111  CO2 21* 23  GLUCOSE 81 129*  BUN 31* 28*  CREATININE 2.13* 2.06*  CALCIUM 8.6* 8.5*  MG 2.0  --   AST 21  --   ALT 25  --   ALKPHOS 94  --   BILITOT 0.5  --    RADIOLOGY:  Dg Chest Portable 1 View  Result Date: 08/01/2017 CLINICAL DATA:  Cough for the past several weeks. Shortness of breath. EXAM: PORTABLE CHEST 1 VIEW COMPARISON:  Chest x-ray and CT chest dated May 04, 2016. FINDINGS: Stable mild cardiomegaly. Normal pulmonary vascularity. Unchanged elevation of the left hemidiaphragm and scarring at the left lung base. No focal consolidation, pleural effusion, or pneumothorax. No acute osseous abnormality. IMPRESSION: No active disease. Electronically Signed   By: Titus Dubin M.D.   On: 08/01/2017 20:52   ASSESSMENT AND PLAN:  [name]  is  a 84 y.o. male who presents with persistent cough shortness of breath on exertion for the past 2 to 3 weeks.  Acute systolic CHF, LV EF: 07% -   40% Start Lasix IV twice daily, Coreg twice daily and CHF protocol.  Follow-up Dr.  Etta Quill recommendation.  Elevated troponin. Demanding ischemia due to above.  Atrial fibrillation with RVR  Better controlled heart rate.  Discontinue Cardizem and start Coreg, continue Pradaxa.  CKD stage III.  Follow-up with BMP while on Lasix.  Essential hypertension -continue home meds Diabetes T2, continue sliding scale insulin with corresponding glucose checks Hyperlipidemia, start Lipitor. Obesity.  All the records are reviewed and case discussed with Care Management/Social Worker. Management plans discussed with the patient, his wife and they are in agreement.  CODE STATUS: Full Code  TOTAL TIME TAKING CARE OF THIS PATIENT: 32 minutes.   More than 50% of the time was spent in counseling/coordination of care: YES  POSSIBLE D/C IN 2 DAYS, DEPENDING ON CLINICAL CONDITION.   [name] M.D on 08/02/2017 at 3:45 PM  Between 7am to 6pm - Pager - [phone]  After 6pm go to [email or web address] - [facility]

## 2017-08-02 NOTE — Care Management Note (Signed)
Case Management Note  Patient Details  Name: [name] MRN: [id number] Date of Birth: [birth year]  Subjective/Objective:  Patient admitted to [facility] under observation status for elevated troponin. RNCM consulted on patient to provide MOON letter and complete assessment. Patient lives with his spouse [name] [phone], who is at the bedside. Patient is completely independent with all activities of daily living. Still works and drives himself. PCP is Dr [name]. [facility] in [place] and obtains medications without issue.                      Action/Plan: RNCM to continue to follow for any needs.   Expected Discharge Date:                  Expected Discharge Plan:     In-House Referral:     Discharge planning Services     Post Acute Care Choice:    Choice offered to:     DME Arranged:    DME Agency:     HH Arranged:    HH Agency:     Status of Service:     If discussed at [employer], dates discussed:    Additional Comments:  Latanya Maudlin, RN 08/02/2017, 8:04 AM

## 2017-08-02 NOTE — Progress Notes (Signed)
*  PRELIMINARY RESULTS* Echocardiogram 2D Echocardiogram has been performed. Definity IV Contrast used on this study.  [name] Tysha Grismore 08/02/2017, 12:26 PM

## 2017-08-02 NOTE — H&P (Signed)
[facility] [place] NAME: [name]    MR#:  [id number]  DATE OF BIRTH:  01-13-1934  DATE OF ADMISSION:  08/01/2017  PRIMARY CARE PHYSICIAN: Ronnell Freshwater, NP   REQUESTING/REFERRING PHYSICIAN: Karma Greaser, MD  CHIEF COMPLAINT:   Chief Complaint  Patient presents with  . Cough    HISTORY OF PRESENT ILLNESS:  [name]  is a 84 y.o. male who presents with persistent cough for the past 2 to 3 weeks.  Here in the ED the patient's work-up was largely within normal limits except for mildly elevated troponin and an elevated BNP.  Patient's wife states that she is worried that perhaps there is some mold in their house which is making the patient cough.  Work-up here initially is otherwise largely within normal limits, with no infiltrates seen on x-ray.  Hospitalist were called for admission  PAST MEDICAL HISTORY:   Past Medical History:  Diagnosis Date  . Diabetes ([place])   . Hearing loss   . High blood pressure   . History of bladder problems   . Kidney stones 11/22/2014     PAST SURGICAL HISTORY:   Past Surgical History:  Procedure Laterality Date  . PROSTATE CANCER    . PROSTATE SURGERY       SOCIAL HISTORY:   Social History   Tobacco Use  . Smoking status: Former Smoker    Years: 16.00    Types: Cigarettes  . Smokeless tobacco: Never Used  Substance Use Topics  . Alcohol use: No     FAMILY HISTORY:   Family History  Problem Relation Age of Onset  . Cancer Mother   . Diabetes Unknown   . High blood pressure Unknown   . Prostate cancer Brother      DRUG ALLERGIES:  No Known Allergies  MEDICATIONS AT HOME:   Prior to Admission medications   Medication Sig Start Date End Date Taking? Authorizing Provider  CARTIA XT 120 MG 24 hr capsule Take 120 mg by mouth daily. 04/22/16  Yes [provider]  glipiZIDE (GLUCOTROL) 10 MG tablet Take 1 tablet (10 mg total) by mouth 2 (two) times daily.  06/04/17  Yes Boscia, Heather E, NP  lisinopril (PRINIVIL,ZESTRIL) 5 MG tablet Take 1 tablet (5 mg total) by mouth daily. 07/01/17  Yes Boscia, Greer Ee, NP  nitroGLYCERIN (NITROSTAT) 0.4 MG SL tablet Place 1 tablet (0.4 mg total) under the tongue every 5 (five) minutes as needed for chest pain. 05/05/16  Yes Theodoro Grist, MD  PRADAXA 150 MG CAPS capsule Take 150 mg by mouth 2 (two) times daily. 04/05/16  Yes [provider]  diclofenac sodium (VOLTAREN) 1 % GEL Apply 4 g topically 4 (four) times daily. Patient not taking: Reported on 08/01/2017 05/29/17   Caryn Section Linden Dolin, PA-C  ONETOUCH VERIO test strip USE UTD TO CHECK BLOOD SUGAR ONCE D 02/03/17   Ronnell Freshwater, NP  tretinoin (RETIN-A) 0.025 % cream Apply topically at bedtime. Patient not taking: Reported on 08/01/2017 04/10/17   Ronnell Freshwater, NP    REVIEW OF SYSTEMS:  Review of Systems  Constitutional: Negative for chills, fever, malaise/fatigue and weight loss.  HENT: Negative for ear pain, hearing loss and tinnitus.   Eyes: Negative for blurred vision, double vision, pain and redness.  Respiratory: Positive for cough and shortness of breath. Negative for hemoptysis.   Cardiovascular: Negative for chest pain, palpitations, orthopnea and leg swelling.  Gastrointestinal: Negative for  abdominal pain, constipation, diarrhea, nausea and vomiting.  Genitourinary: Negative for dysuria, frequency and hematuria.  Musculoskeletal: Negative for back pain, joint pain and neck pain.  Skin:       No acne, rash, or lesions  Neurological: Negative for dizziness, tremors, focal weakness and weakness.  Endo/Heme/Allergies: Negative for polydipsia. Does not bruise/bleed easily.  Psychiatric/Behavioral: Negative for depression. The patient is not nervous/anxious and does not have insomnia.      VITAL SIGNS:   Vitals:   08/01/17 2100 08/01/17 2130 08/01/17 2230 08/01/17 2300  BP: 123/83 (!) 106/93 (!) 124/102 (!) 119/94  Pulse: (!) 117  (!) 139 (!) 56 79  Resp: (!) 28 20 20  (!) 27  Temp:      TempSrc:      SpO2: 94% 100% 95% 97%   Wt Readings from Last 3 Encounters:  07/01/17 119.9 kg (264 lb 6.4 oz)  05/29/17 117.9 kg (260 lb)  04/10/17 116.8 kg (257 lb 9.6 oz)    PHYSICAL EXAMINATION:  Physical Exam  Vitals reviewed. Constitutional: He is oriented to person, place, and time. He appears well-developed and well-nourished. No distress.  HENT:  Head: Normocephalic and atraumatic.  Mouth/Throat: Oropharynx is clear and moist.  Eyes: Pupils are equal, round, and reactive to light. Conjunctivae and EOM are normal. No scleral icterus.  Neck: Normal range of motion. Neck supple. No JVD present. No thyromegaly present.  Cardiovascular: Normal rate, regular rhythm and intact distal pulses. Exam reveals no gallop and no friction rub.  No murmur heard. Respiratory: Effort normal and breath sounds normal. No respiratory distress. He has no wheezes. He has no rales.  GI: Soft. Bowel sounds are normal. He exhibits no distension. There is no tenderness.  Musculoskeletal: Normal range of motion. He exhibits no edema.  No arthritis, no gout  Lymphadenopathy:    He has no cervical adenopathy.  Neurological: He is alert and oriented to person, place, and time. No cranial nerve deficit.  No dysarthria, no aphasia  Skin: Skin is warm and dry. No rash noted. No erythema.  Psychiatric: He has a normal mood and affect. His behavior is normal. Judgment and thought content normal.    LABORATORY PANEL:   CBC Recent Labs  Lab 08/01/17 2108  WBC 5.3  HGB 13.4  HCT 41.4  PLT 173   ------------------------------------------------------------------------------------------------------------------  Chemistries  Recent Labs  Lab 08/01/17 2108  NA 140  K 4.3  CL 112*  CO2 21*  GLUCOSE 81  BUN 31*  CREATININE 2.13*  CALCIUM 8.6*  MG 2.0  AST 21  ALT 25  ALKPHOS 94  BILITOT 0.5    ------------------------------------------------------------------------------------------------------------------  Cardiac Enzymes Recent Labs  Lab 08/01/17 2108  TROPONINI 0.05*   ------------------------------------------------------------------------------------------------------------------  RADIOLOGY:  Dg Chest Portable 1 View  Result Date: 08/01/2017 CLINICAL DATA:  Cough for the past several weeks. Shortness of breath. EXAM: PORTABLE CHEST 1 VIEW COMPARISON:  Chest x-ray and CT chest dated May 04, 2016. FINDINGS: Stable mild cardiomegaly. Normal pulmonary vascularity. Unchanged elevation of the left hemidiaphragm and scarring at the left lung base. No focal consolidation, pleural effusion, or pneumothorax. No acute osseous abnormality. IMPRESSION: No active disease. Electronically Signed   By: Titus Dubin M.D.   On: 08/01/2017 20:52    EKG:   Orders placed or performed during the hospital encounter of 08/01/17  . ED EKG  . ED EKG  . EKG 12-Lead  . EKG 12-Lead  . EKG 12-Lead  . EKG 12-Lead  IMPRESSION AND PLAN:  Principal Problem:   Elevated troponin -troponin is mildly elevated, we will trend his cardiac enzymes tonight and get an echocardiogram in the morning.  His BNP was also elevated, there is some concern for possible CHF as etiology of his persistent cough, though his x-ray did not show any edema Active Problems:   Cough -unclear etiology, cardiac work-up as above, will also place him on azithromycin empirically   Atrial fibrillation with RVR ([place]) -currently rate controlled after initial treatment in the ED, continue rate controlling medications and anticoagulation   Essential hypertension -continue home meds   Diabetes ([place]) -sliding scale insulin with corresponding glucose checks   Hyperlipidemia, mixed -Home dose antilipid  Chart review performed and case discussed with ED provider. Labs, imaging and/or ECG reviewed by provider and discussed with  patient/family. Management plans discussed with the patient and/or family.  DVT PROPHYLAXIS: Systemic anticoagulation  GI PROPHYLAXIS: None  ADMISSION STATUS: Observation  CODE STATUS: Full Code Status History    Date Active Date Inactive Code Status Order ID Comments User Context   05/04/2016 2049 05/05/2016 1707 Full Code [id number]  Idelle Crouch, MD Inpatient      TOTAL TIME TAKING CARE OF THIS PATIENT: 40 minutes.   Maricia Scotti Bieber 08/02/2017, 1:05 AM  [employer]  [phone]  CC: Primary care physician; Ronnell Freshwater, NP  Note:  This document was prepared using Dragon voice recognition software and may include unintentional dictation errors.

## 2017-08-02 NOTE — Care Management Obs Status (Signed)
[facility] NOTIFICATION   Patient Details  Name: [name] MRN: [id number] Date of Birth: 1933/12/30   Medicare Observation Status Notification Given:  Yes    Amani Nodarse A Chanelle Hodsdon, RN 08/02/2017, 7:55 AM

## 2017-08-02 NOTE — Progress Notes (Signed)
Hypoglycemic Event  CBG: 61  Treatment: 4 oz orange juice  Symptoms: asymptomatic  Follow-up CBG: [id number] CBG Result:105  Possible Reasons for Event: unknown  Comments/MD notified:chen    [name] C Sherren Mocha

## 2017-08-02 NOTE — Progress Notes (Signed)
During RN/MD rounding MD orders tussionex for patient prn. I will continue to assess.

## 2017-08-03 LAB — GLUCOSE, CAPILLARY
Glucose-Capillary: 119 mg/dL — ABNORMAL HIGH (ref 70–99)
Glucose-Capillary: 147 mg/dL — ABNORMAL HIGH (ref 70–99)
Glucose-Capillary: 86 mg/dL (ref 70–99)
Glucose-Capillary: 118 mg/dL — ABNORMAL HIGH (ref 70–99)

## 2017-08-03 LAB — BASIC METABOLIC PANEL
Anion gap: 8 (ref 5–15)
BUN: 31 mg/dL — ABNORMAL HIGH (ref 8–23)
CO2: 25 mmol/L (ref 22–32)
Calcium: 8.8 mg/dL — ABNORMAL LOW (ref 8.9–10.3)
Chloride: 105 mmol/L (ref 98–111)
Creatinine, Ser: 2.09 mg/dL — ABNORMAL HIGH (ref 0.61–1.24)
GFR calc Af Amer: 32 mL/min — ABNORMAL LOW (ref >60)
GFR calc non Af Amer: 28 mL/min — ABNORMAL LOW (ref >60)
Glucose, Bld: 112 mg/dL — ABNORMAL HIGH (ref 70–99)
Potassium: 4.2 mmol/L (ref 3.5–5.1)
Sodium: 138 mmol/L (ref 135–145)

## 2017-08-03 NOTE — Progress Notes (Signed)
[facility] at [place] NAME: [name]    MR#:  [id number]  DATE OF BIRTH:  07-Jun-1933  SUBJECTIVE:  CHIEF COMPLAINT:   Chief Complaint  Patient presents with  . Cough   Better cough and shortness of breath. REVIEW OF SYSTEMS:  Review of Systems  Constitutional: Positive for malaise/fatigue. Negative for chills and fever.  HENT: Negative for sore throat.   Eyes: Negative for blurred vision and double vision.  Respiratory: Positive for cough, sputum production and shortness of breath. Negative for hemoptysis, wheezing and stridor.   Cardiovascular: Negative for chest pain, palpitations, orthopnea and leg swelling.  Gastrointestinal: Negative for abdominal pain, blood in stool, diarrhea, melena, nausea and vomiting.  Genitourinary: Negative for dysuria, flank pain and hematuria.  Musculoskeletal: Negative for back pain and joint pain.  Skin: Negative for rash.  Neurological: Negative for dizziness, sensory change, focal weakness, seizures, loss of consciousness, weakness and headaches.  Endo/Heme/Allergies: Negative for polydipsia.  Psychiatric/Behavioral: Negative for depression. The patient is not nervous/anxious.     DRUG ALLERGIES:  No Known Allergies VITALS:  Blood pressure 139/73, pulse 67, temperature 97.8 F (36.6 C), temperature source Oral, resp. rate 18, height 6' (1.829 m), weight 256 lb (116.1 kg), SpO2 98 %. PHYSICAL EXAMINATION:  Physical Exam  Constitutional: He is oriented to person, place, and time. He appears well-developed.  Obesity.  HENT:  Head: Normocephalic.  Mouth/Throat: Oropharynx is clear and moist.  Eyes: Pupils are equal, round, and reactive to light. Conjunctivae and EOM are normal. No scleral icterus.  Neck: Normal range of motion. Neck supple. No JVD present. No tracheal deviation present.  Cardiovascular: Normal rate, regular rhythm and normal heart sounds. Exam reveals no gallop.  No murmur  heard. Pulmonary/Chest: Effort normal. No respiratory distress. He has no wheezes. He has rales.  Bilateral basilar rales.  Abdominal: Soft. Bowel sounds are normal. He exhibits no distension. There is no tenderness. There is no rebound.  Musculoskeletal: Normal range of motion. He exhibits edema. He exhibits no tenderness.  Bilateral leg edema.  Neurological: He is alert and oriented to person, place, and time. No cranial nerve deficit.  Skin: No rash noted. No erythema.  Psychiatric: He has a normal mood and affect.  Vitals reviewed.  LABORATORY PANEL:  Male CBC Recent Labs  Lab 08/02/17 0322  WBC 5.4  HGB 13.6  HCT 42.1  PLT 161   ------------------------------------------------------------------------------------------------------------------ Chemistries  Recent Labs  Lab 08/01/17 2108  08/03/17 0438  NA 140   < > 138  K 4.3   < > 4.2  CL 112*   < > 105  CO2 21*   < > 25  GLUCOSE 81   < > 112*  BUN 31*   < > 31*  CREATININE 2.13*   < > 2.09*  CALCIUM 8.6*   < > 8.8*  MG 2.0  --   --   AST 21  --   --   ALT 25  --   --   ALKPHOS 94  --   --   BILITOT 0.5  --   --    < > = values in this interval not displayed.   RADIOLOGY:  No results found. ASSESSMENT AND PLAN:  [name]  is a 84 y.o. male who presents with persistent cough shortness of breath on exertion for the past 2 to 3 weeks.  Acute systolic CHF, LV EF: 29% -   40% Started Lasix  IV twice daily, Coreg twice daily and CHF protocol.  Follow-up Dr. Etta Quill recommendation.  Elevated troponin. Demanding ischemia due to above.  Atrial fibrillation with RVR  Better controlled heart rate.  Discontinued Cardizem and increased Coreg to 6.25 mg po bid, continue Pradaxa.  CKD stage III.  Follow-up with BMP while on Lasix.  Essential hypertension -continue home meds Diabetes T2, continue sliding scale insulin with corresponding glucose checks Hyperlipidemia, started Lipitor. Obesity.  I discussed  with Dr. Clayborn Bigness. All the records are reviewed and case discussed with Care Management/Social Worker. Management plans discussed with the patient, his wife and they are in agreement.  CODE STATUS: Full Code  TOTAL TIME TAKING CARE OF THIS PATIENT: 33 minutes.   More than 50% of the time was spent in counseling/coordination of care: YES  POSSIBLE D/C IN 2 DAYS, DEPENDING ON CLINICAL CONDITION.   Demetrios Loll M.D on 08/03/2017 at 2:13 PM  Between 7am to 6pm - Pager - [phone]  After 6pm go to [email or web address] - [facility]

## 2017-08-03 NOTE — Plan of Care (Signed)
Nutrition Education Note  RD consulted for nutrition education regarding new onset CHF.  84 year old male with PMHx of DM type 2, HTN, HLD, hearing loss, CKD III who is now admitted with acute systolic CHF (LVEF 02-77%), A-fib with RVR.  Met with patient and his wife at bedside. Patient reports he has a good appetite now and also had a good appetite and intake PTA. He eats 3 meals per day and they are prepared by his wife. She has been keeping him on carbohydrate-modified diet and limiting added sugars in his diet. They have already been limiting sodium in diet by buying low-sodium foods, rinsing any canned vegetables twice, not cooking with salt, and not adding salt at the table. They do occasionally go out to eat, which wife admits is likely very high in salt. We discussed that processed foods and food from restaurants tend to be highest in sodium. Encouraged patient and wife to look at nutrition facts at restaurant to decide if that meal can fit into a low-salt diet. Encouraged a majority of meals to be made from fresh or frozen foods at home where salt intake can be better monitored. Wife and patient are going to purchase a digital scale for monitoring weight at home. He has remained weight stable except for recent increase in weight with edema.  RD provided "Heart Failure Nutrition Therapy" handout from the Academy of [employer]. Reviewed patient's dietary recall. Provided examples on ways to decrease sodium intake in diet. Discouraged intake of processed foods and use of salt shaker. Encouraged fresh fruits and vegetables as well as whole grain sources of carbohydrates to maximize fiber intake.  RD discussed why it is important for patient to adhere to diet recommendations, and emphasized the role of fluids, foods to avoid, and importance of weighing self daily. Teach back method used.  Expect good compliance.  Body mass index is 34.72 kg/m. Pt meets criteria for Obesity Class I  based on current BMI.  Current diet order is Heart Healthy/Carbohydrate Modified, patient is consuming approximately 100% of meals at this time (per his report; not yet recorded in chart). Labs and medications reviewed. No further nutrition interventions warranted at this time. RD contact information provided. If additional nutrition issues arise, please re-consult RD.   Willey Blade, MS, [place], LDN Office: [phone] Pager: [phone] After Hours/Weekend Pager: [phone]

## 2017-08-04 LAB — GLUCOSE, CAPILLARY
Glucose-Capillary: 129 mg/dL — ABNORMAL HIGH (ref 70–99)
Glucose-Capillary: 127 mg/dL — ABNORMAL HIGH (ref 70–99)

## 2017-08-04 LAB — BASIC METABOLIC PANEL
Anion gap: 8 (ref 5–15)
BUN: 36 mg/dL — ABNORMAL HIGH (ref 8–23)
CO2: 26 mmol/L (ref 22–32)
Calcium: 8.8 mg/dL — ABNORMAL LOW (ref 8.9–10.3)
Chloride: 101 mmol/L (ref 98–111)
Creatinine, Ser: 1.9 mg/dL — ABNORMAL HIGH (ref 0.61–1.24)
GFR calc Af Amer: 36 mL/min — ABNORMAL LOW (ref >60)
GFR calc non Af Amer: 31 mL/min — ABNORMAL LOW (ref >60)
Glucose, Bld: 130 mg/dL — ABNORMAL HIGH (ref 70–99)
Potassium: 4.2 mmol/L (ref 3.5–5.1)
Sodium: 135 mmol/L (ref 135–145)

## 2017-08-04 LAB — MAGNESIUM: Magnesium: 1.9 mg/dL (ref 1.7–2.4)

## 2017-08-04 MED ORDER — FUROSEMIDE 20 MG PO TABS: 20.0000 mg | ORAL_TABLET | Freq: Two times a day (BID) | ORAL | 1 refills | Status: DC

## 2017-08-04 MED ORDER — HYDROCOD POLST-CPM POLST ER 10-8 MG/5ML PO SUER: 5.0000 mL | Freq: Two times a day (BID) | ORAL | 0 refills | Status: DC | PRN

## 2017-08-04 MED ORDER — FUROSEMIDE 20 MG PO TABS
20.0000 mg | ORAL_TABLET | Freq: Two times a day (BID) | ORAL | Status: DC
  Administered 2017-08-04: 20 mg via ORAL
  Filled 2017-08-04: qty 1

## 2017-08-04 MED ORDER — CARVEDILOL 12.5 MG PO TABS: 12.5000 mg | ORAL_TABLET | Freq: Two times a day (BID) | ORAL | 1 refills | Status: DC

## 2017-08-04 MED ORDER — CARVEDILOL 6.25 MG PO TABS
6.2500 mg | ORAL_TABLET | Freq: Once | ORAL | Status: DC
  Filled 2017-08-04: qty 1

## 2017-08-04 MED ORDER — CARVEDILOL 12.5 MG PO TABS: 12.5000 mg | ORAL_TABLET | Freq: Two times a day (BID) | ORAL | Status: DC

## 2017-08-04 MED ORDER — ATORVASTATIN CALCIUM 40 MG PO TABS: 40.0000 mg | ORAL_TABLET | Freq: Every day | ORAL | 1 refills | Status: DC

## 2017-08-04 NOTE — Care Management (Signed)
No discharge needs identified by members of the care team or patient 

## 2017-08-04 NOTE — Discharge Summary (Signed)
[facility] NAME: [name]    MR#:  [id number]  DATE OF BIRTH:  September 27, 1933  DATE OF ADMISSION:  08/01/2017   ADMITTING PHYSICIAN: Lance Coon, MD  DATE OF DISCHARGE: 08/04/2017 PRIMARY CARE PHYSICIAN: Ronnell Freshwater, NP   ADMISSION DIAGNOSIS:  Cough [R05] Dyspnea on exertion [R06.09] Chronic atrial fibrillation (HCC) [I48.2] Demand ischemia (HCC) [I24.8] SOB (shortness of breath) on exertion [R06.02] Elevated troponin I level [R74.8] Chronic kidney disease, unspecified CKD stage [N18.9] DISCHARGE DIAGNOSIS:  Principal Problem:   Elevated troponin Active Problems:   Essential hypertension   Cough   Diabetes (HCC)   Hyperlipidemia, mixed   Atrial fibrillation with RVR (HCC)   Acute CHF (congestive heart failure) ([place])  SECONDARY DIAGNOSIS:   Past Medical History:  Diagnosis Date  . Diabetes ([place])   . Hearing loss   . High blood pressure   . History of bladder problems   . Kidney stones 11/22/2014   HOSPITAL COURSE:   [name] presents with persistent cough shortness of breath on exertion for the past 2 to 3 weeks.  Acute systolic CHF, LV EF: 00% - 40% Started Lasix IV twice daily, Coreg twice daily and CHF protocol.  Follow-up Dr. Etta Quill recommendation.  Changed to Lasix 20 mg p.o. twice daily.  Follow-up with Dr. [name] as outpatient.  Elevated troponin. Demanding ischemia due to above.  Atrial fibrillation with RVR  Better controlled heart rate.  Discontinued Cardizem and increased Coreg to 12.5 mg po bid, continue Pradaxa.  CKD stage III.  Follow-up with BMP while on Lasix.  Stable.  Essential hypertension -continue home meds Diabetes T2, continue sliding scale insulin with corresponding glucose checks Hyperlipidemia, started Lipitor. Obesity.  Diet control advised. DISCHARGE CONDITIONS:  Stable, discharge to home today. CONSULTS OBTAINED:    Treatment Team:  Corey Skains, MD Yolonda Kida, MD DRUG ALLERGIES:  No Known Allergies DISCHARGE MEDICATIONS:   Allergies as of 08/04/2017   No Known Allergies     Medication List    STOP taking these medications   CARTIA XT 120 MG 24 hr capsule Generic drug:  diltiazem     TAKE these medications   atorvastatin 40 MG tablet Commonly known as:  LIPITOR Take 1 tablet (40 mg total) by mouth daily at 6 PM.   carvedilol 12.5 MG tablet Commonly known as:  COREG Take 1 tablet (12.5 mg total) by mouth 2 (two) times daily with a meal.   chlorpheniramine-HYDROcodone 10-8 MG/5ML Suer Commonly known as:  TUSSIONEX Take 5 mLs by mouth every 12 (twelve) hours as needed for cough.   diclofenac sodium 1 % Gel Commonly known as:  VOLTAREN Apply 4 g topically 4 (four) times daily.   furosemide 20 MG tablet Commonly known as:  LASIX Take 1 tablet (20 mg total) by mouth 2 (two) times daily.   glipiZIDE 10 MG tablet Commonly known as:  GLUCOTROL Take 1 tablet (10 mg total) by mouth 2 (two) times daily.   lisinopril 5 MG tablet Commonly known as:  PRINIVIL,ZESTRIL Take 1 tablet (5 mg total) by mouth daily.   nitroGLYCERIN 0.4 MG SL tablet Commonly known as:  NITROSTAT Place 1 tablet (0.4 mg total) under the tongue every 5 (five) minutes as needed for chest pain.   ONETOUCH VERIO test strip Generic drug:  glucose blood USE UTD TO CHECK BLOOD SUGAR ONCE D   PRADAXA 150 MG Caps capsule Generic drug:  dabigatran  Take 150 mg by mouth 2 (two) times daily.   tretinoin 0.025 % cream Commonly known as:  RETIN-A Apply topically at bedtime.        DISCHARGE INSTRUCTIONS:  See AVS.   If you experience worsening of your admission symptoms, develop shortness of breath, life threatening emergency, suicidal or homicidal thoughts you must seek medical attention immediately by calling 911 or calling your MD immediately  if symptoms less severe.  You Must read complete  instructions/literature along with all the possible adverse reactions/side effects for all the Medicines you take and that have been prescribed to you. Take any new Medicines after you have completely understood and accpet all the possible adverse reactions/side effects.   Please note  You were cared for by a hospitalist during your hospital stay. If you have any questions about your discharge medications or the care you received while you were in the hospital after you are discharged, you can call the unit and asked to speak with the hospitalist on call if the hospitalist that took care of you is not available. Once you are discharged, your primary care physician will handle any further medical issues. Please note that NO REFILLS for any discharge medications will be authorized once you are discharged, as it is imperative that you return to your primary care physician (or establish a relationship with a primary care physician if you do not have one) for your aftercare needs so that they can reassess your need for medications and monitor your lab values.    On the day of Discharge:  VITAL SIGNS:  Blood pressure (!) 117/103, pulse 69, temperature 98.3 F (36.8 C), temperature source Oral, resp. rate 18, height 6' (1.829 m), weight 247 lb 1.6 oz (112.1 kg), SpO2 97 %. PHYSICAL EXAMINATION:  GENERAL:  84 y.o.-year-old patient lying in the bed with no acute distress.  EYES: Pupils equal, round, reactive to light and accommodation. No scleral icterus. Extraocular muscles intact.  HEENT: Head atraumatic, normocephalic. Oropharynx and nasopharynx clear.  NECK:  Supple, no jugular venous distention. No thyroid enlargement, no tenderness.  LUNGS: Normal breath sounds bilaterally, no wheezing,tiny basilar rales on right base,rhonchi or crepitation. No use of accessory muscles of respiration.  CARDIOVASCULAR: S1, S2 normal. No murmurs, rubs, or gallops.  ABDOMEN: Soft, non-tender, non-distended. Bowel sounds  present. No organomegaly or mass.  EXTREMITIES: No pedal edema, cyanosis, or clubbing.  NEUROLOGIC: Cranial nerves II through XII are intact. Muscle strength 5/5 in all extremities. Sensation intact. Gait not checked.  PSYCHIATRIC: The patient is alert and oriented x 3.  SKIN: No obvious rash, lesion, or ulcer.  DATA REVIEW:   CBC Recent Labs  Lab 08/02/17 0322  WBC 5.4  HGB 13.6  HCT 42.1  PLT 161    Chemistries  Recent Labs  Lab 08/01/17 2108  08/04/17 0404  NA 140   < > 135  K 4.3   < > 4.2  CL 112*   < > 101  CO2 21*   < > 26  GLUCOSE 81   < > 130*  BUN 31*   < > 36*  CREATININE 2.13*   < > 1.90*  CALCIUM 8.6*   < > 8.8*  MG 2.0  --  1.9  AST 21  --   --   ALT 25  --   --   ALKPHOS 94  --   --   BILITOT 0.5  --   --    < > = values  in this interval not displayed.     Microbiology Results  Results for orders placed or performed in visit on 07/01/17  Microscopic Examination     Status: Abnormal   Collection Time: 07/01/17  2:39 PM  Result Value Ref Range Status   WBC, UA 0-5 0 - 5 /hpf Final   RBC, UA 3-10 (A) 0 - 2 /hpf Final   Epithelial Cells (non renal) 0-10 0 - 10 /hpf Final   Casts None seen None seen /lpf Final   Mucus, UA Present Not Estab. Final   Bacteria, UA None seen None seen/Few Final    RADIOLOGY:  No results found.   Management plans discussed with the patient, family and they are in agreement.  CODE STATUS: Full Code   TOTAL TIME TAKING CARE OF THIS PATIENT: 33 minutes.    Demetrios Loll M.D on 08/04/2017 at 9:46 AM  Between 7am to 6pm - Pager - [phone]  After 6pm go to [email or web address] - Proofreader  [facility]  Office  [phone]  CC: Primary care physician; Ronnell Freshwater, NP   Note: This dictation was prepared with Dragon dictation along with smaller phrase technology. Any transcriptional errors that result from this process are unintentional.

## 2017-08-04 NOTE — Progress Notes (Signed)
Provided patient with "Living Better with Heart Failure" packet. Briefly reviewed definition of heart failure and signs and symptoms of an exacerbation. Reviewed importance of and reason behind checking weight daily in the AM, after using the bathroom, but before getting dressed. Discussed when to call the Dr= weight gain of >2lb overnight of 5lb in a week,  Discussed yellow zone= call MD: weight gain of >2lb overnight of 5lb in a week, increased swelling, increased SOB when lying down, chest discomfort, dizziness, increased fatigue Red Zone= call 911: struggle to breath, fainting or near fainting, significant chest pain Reviewed low sodium diet <2g/day-provided handout of recommended and not recommended foods  Fluid restriction <2L/day Reviewed how to read nutrition label Reviewed medication changes: stop diltiazem- start carvediolol, lasix, continue lisinopril, start atorvastatin, continue pradaxa Explained briefly why pt is on the medications (either make you feel better, live longer or keep you out of the hospital) and discussed monitoring and side effects  Reccommended using a pill box w/ AM and PM slots Discussed HF clinic appointment and role of the clinic  [place], Pharm.D, BCPS Clinical Pharmacist

## 2017-08-04 NOTE — Discharge Instructions (Signed)
Heart healthy, low sodium and ADA diet.  Information on my medicine - Pradaxa (dabigatran)  Why was Pradaxa prescribed for you? Pradaxa was prescribed for you to reduce the risk of forming blood clots that cause a stroke if you have a medical condition called atrial fibrillation (a type of irregular heartbeat).    What do you Need to know about PradAXa? Take your Pradaxa TWICE DAILY - one capsule in the morning and one tablet in the evening with or without food.  It would be best to take the doses about the same time each day.  The capsules should not be broken, chewed or opened - they must be swallowed whole.  Do not store Pradaxa in other medication containers - once the bottle is opened the Pradaxa should be used within FOUR months; throw away any capsules that havent been by that time.  Take Pradaxa exactly as prescribed by your doctor.  DO NOT stop taking Pradaxa without talking to the doctor who prescribed the medication.  Stopping without other stroke prevention medication to take the place of Pradaxa may increase your risk of developing a clot that causes a stroke.  Refill your prescription before you run out.  After discharge, you should have regular check-up appointments with your healthcare provider that is prescribing your Pradaxa.  In the future your dose may need to be changed if your kidney function or weight changes by a significant amount.  What do you do if you miss a dose? If you miss a dose, take it as soon as you remember on the same day.  If your next dose is less than 6 hours away, skip the missed dose.  Do not take two doses of PRADAXA at the same time.  Important Safety Information A possible side effect of Pradaxa is bleeding. You should call your healthcare provider right away if you experience any of the following: ? Bleeding from an injury or your nose that does not stop. ? Unusual colored urine (red or dark brown) or unusual colored stools (red or  black). ? Unusual bruising for unknown reasons. ? A serious fall or if you hit your head (even if there is no bleeding).  Some medicines may interact with Pradaxa and might increase your risk of bleeding or clotting while on Pradaxa. To help avoid this, consult your healthcare provider or pharmacist prior to using any new prescription or non-prescription medications, including herbals, vitamins, non-steroidal anti-inflammatory drugs (NSAIDs) and supplements.  This website has more information on Pradaxa (dabigatran): [email or web address]://www.pradaxa.com

## 2017-08-06 ENCOUNTER — Other Ambulatory Visit: Payer: Self-pay

## 2017-08-06 ENCOUNTER — Encounter: Payer: Self-pay | Admitting: Emergency Medicine

## 2017-08-06 ENCOUNTER — Emergency Department
Admission: EM | Admit: 2017-08-06 | Discharge: 2017-08-06 | Disposition: A | Discharge status: Home / Self Care | Payer: Medicare Other | Attending: Student in an Organized Health Care Education/Training Program | Admitting: Student in an Organized Health Care Education/Training Program

## 2017-08-06 ENCOUNTER — Emergency Department: Payer: Medicare Other

## 2017-08-06 DIAGNOSIS — N189 Chronic kidney disease, unspecified: Secondary | ICD-10-CM | POA: Diagnosis not present

## 2017-08-06 DIAGNOSIS — I4891 Unspecified atrial fibrillation: Secondary | ICD-10-CM | POA: Diagnosis not present

## 2017-08-06 DIAGNOSIS — I509 Heart failure, unspecified: Secondary | ICD-10-CM | POA: Insufficient documentation

## 2017-08-06 DIAGNOSIS — E1122 Type 2 diabetes mellitus with diabetic chronic kidney disease: Secondary | ICD-10-CM | POA: Insufficient documentation

## 2017-08-06 DIAGNOSIS — R079 Chest pain, unspecified: Secondary | ICD-10-CM | POA: Diagnosis not present

## 2017-08-06 DIAGNOSIS — Z87891 Personal history of nicotine dependence: Secondary | ICD-10-CM | POA: Diagnosis not present

## 2017-08-06 DIAGNOSIS — R000 Tachycardia, unspecified: Secondary | ICD-10-CM | POA: Diagnosis not present

## 2017-08-06 DIAGNOSIS — I130 Hypertensive heart and chronic kidney disease with heart failure and stage 1 through stage 4 chronic kidney disease, or unspecified chronic kidney disease: Secondary | ICD-10-CM | POA: Insufficient documentation

## 2017-08-06 DIAGNOSIS — Z8546 Personal history of malignant neoplasm of prostate: Secondary | ICD-10-CM | POA: Insufficient documentation

## 2017-08-06 DIAGNOSIS — Z79899 Other long term (current) drug therapy: Secondary | ICD-10-CM | POA: Diagnosis not present

## 2017-08-06 DIAGNOSIS — R0602 Shortness of breath: Secondary | ICD-10-CM | POA: Diagnosis not present

## 2017-08-06 LAB — BASIC METABOLIC PANEL
Anion gap: 7 (ref 5–15)
BUN: 51 mg/dL — ABNORMAL HIGH (ref 8–23)
CO2: 29 mmol/L (ref 22–32)
Calcium: 9.1 mg/dL (ref 8.9–10.3)
Chloride: 100 mmol/L (ref 98–111)
Creatinine, Ser: 2.63 mg/dL — ABNORMAL HIGH (ref 0.61–1.24)
GFR calc Af Amer: 24 mL/min — ABNORMAL LOW (ref >60)
GFR calc non Af Amer: 21 mL/min — ABNORMAL LOW (ref >60)
Glucose, Bld: 174 mg/dL — ABNORMAL HIGH (ref 70–99)
Potassium: 4.5 mmol/L (ref 3.5–5.1)
Sodium: 136 mmol/L (ref 135–145)

## 2017-08-06 LAB — CBC
HCT: 49.3 % (ref 40.0–52.0)
Hemoglobin: 15.9 g/dL (ref 13.0–18.0)
MCH: 29.5 pg (ref 26.0–34.0)
MCHC: 32.4 g/dL (ref 32.0–36.0)
MCV: 91 fL (ref 80.0–100.0)
Platelets: 220 10*3/uL (ref 150–440)
RBC: 5.41 MIL/uL (ref 4.40–5.90)
RDW: 16 % — ABNORMAL HIGH (ref 11.5–14.5)
WBC: 6.8 10*3/uL (ref 3.8–10.6)

## 2017-08-06 LAB — BRAIN NATRIURETIC PEPTIDE: B Natriuretic Peptide: 149 pg/mL — ABNORMAL HIGH (ref 0.0–100.0)

## 2017-08-06 LAB — TROPONIN I
Troponin I: 0.04 ng/mL (ref <0.03)
Troponin I: 0.04 ng/mL (ref <0.03)

## 2017-08-06 MED ORDER — BENZONATATE 100 MG PO CAPS
100.0000 mg | ORAL_CAPSULE | Freq: Once | ORAL | Status: AC
  Administered 2017-08-06: 100 mg via ORAL
  Filled 2017-08-06: qty 1

## 2017-08-06 MED ORDER — CARVEDILOL 6.25 MG PO TABS
12.5000 mg | ORAL_TABLET | Freq: Two times a day (BID) | ORAL | Status: DC
  Administered 2017-08-06: 12.5 mg via ORAL

## 2017-08-06 MED ORDER — CARVEDILOL 25 MG PO TABS: 12.5000 mg | ORAL_TABLET | Freq: Two times a day (BID) | ORAL | 0 refills | Status: DC

## 2017-08-06 MED ORDER — BENZONATATE 100 MG PO CAPS: 100.0000 mg | ORAL_CAPSULE | Freq: Four times a day (QID) | ORAL | 0 refills | Status: DC | PRN

## 2017-08-06 MED ORDER — METOPROLOL TARTRATE 5 MG/5ML IV SOLN
2.5000 mg | Freq: Once | INTRAVENOUS | Status: AC
  Administered 2017-08-06: 2.5 mg via INTRAVENOUS
  Filled 2017-08-06: qty 5

## 2017-08-06 MED ORDER — MAGNESIUM SULFATE 2 GM/50ML IV SOLN
2.0000 g | Freq: Once | INTRAVENOUS | Status: AC
  Administered 2017-08-06: 2 g via INTRAVENOUS
  Filled 2017-08-06: qty 50

## 2017-08-06 MED ORDER — SODIUM CHLORIDE 0.9 % IV BOLUS
200.0000 mL | Freq: Once | INTRAVENOUS | Status: AC
  Administered 2017-08-06: 200 mL via INTRAVENOUS

## 2017-08-06 MED ORDER — CARVEDILOL 6.25 MG PO TABS
ORAL_TABLET | ORAL | Status: AC
  Filled 2017-08-06: qty 2

## 2017-08-06 MED ORDER — CARVEDILOL 6.25 MG PO TABS: 12.5000 mg | ORAL_TABLET | Freq: Two times a day (BID) | ORAL | Status: DC

## 2017-08-06 NOTE — ED Notes (Signed)
Pt given applesauce to eat as requested. Family remains at side. Pt denies pain currently.

## 2017-08-06 NOTE — ED Provider Notes (Signed)
[facility] Emergency Department Provider Note    First MD Initiated Contact with Patient 08/06/17 1057     (approximate)  I have reviewed the triage vital signs and the nursing notes.   HISTORY  Chief Complaint Chest Pain and Shortness of Breath    HPI [name] is a 84 y.o. male her diabetes as well as congestive heart failure, A. fib on Pradaxa with recent admission the hospital for worsening shortness of breath volume overload presents to the ER today with again worsening shortness of breath exertional dyspnea.  There were some medication changes after his recent discharge to the hospital and family is worried that they might have been given the appropriate doses.  States that he was feeling very weak and short of breath when ambulating this morning.  States that he ate some oatmeal and did have some burning epigastric pain for which he took a Tums without significant improvement.  Denies any chest pain.  No fevers.  States that abdominal pain is exacerbated by coughing.    Past Medical History:  Diagnosis Date  . Diabetes ([place])   . Hearing loss   . High blood pressure   . History of bladder problems   . Kidney stones 11/22/2014   Family History  Problem Relation Age of Onset  . Cancer Mother   . Diabetes Unknown   . High blood pressure Unknown   . Prostate cancer Brother    Past Surgical History:  Procedure Laterality Date  . PROSTATE CANCER    . PROSTATE SURGERY     Patient Active Problem List   Diagnosis Date Noted  . Atrial fibrillation with RVR ([place]) 08/02/2017  . Acute CHF (congestive heart failure) ([place]) 08/02/2017  . Prostatic hypertrophy 07/20/2017  . Dysuria 07/20/2017  . Acute gout of left ankle 04/10/2017  . Hyperpigmentation 04/10/2017  . Uncontrolled type 2 diabetes mellitus with hyperglycemia ([place]) 03/24/2017  . Encounter for general adult medical examination with abnormal findings 03/24/2017  . Chest pain  05/04/2016  . Elevated troponin 05/04/2016  . Essential hypertension 07/06/2014  . Cough 03/21/2014  . OSA (obstructive sleep apnea) 03/21/2014  . Paroxysmal A-fib ([place]) 03/21/2014  . Hx of adenomatous colonic polyps 06/28/2013  . Liver cyst 06/28/2013  . Chronic kidney disease 06/08/2013  . Diabetes ([place]) 06/08/2013  . Hyperlipidemia, mixed 06/08/2013  . Valvular heart disease 06/08/2013      Prior to Admission medications   Medication Sig Start Date End Date Taking? Authorizing Provider  atorvastatin (LIPITOR) 40 MG tablet Take 1 tablet (40 mg total) by mouth daily at 6 PM. 08/04/17  Yes Demetrios Loll, MD  chlorpheniramine-HYDROcodone (TUSSIONEX) 10-8 MG/5ML SUER Take 5 mLs by mouth every 12 (twelve) hours as needed for cough. 08/04/17  Yes Demetrios Loll, MD  furosemide (LASIX) 20 MG tablet Take 1 tablet (20 mg total) by mouth 2 (two) times daily. 08/04/17  Yes Demetrios Loll, MD  glipiZIDE (GLUCOTROL) 10 MG tablet Take 1 tablet (10 mg total) by mouth 2 (two) times daily. 06/04/17  Yes Boscia, Heather E, NP  lisinopril (PRINIVIL,ZESTRIL) 5 MG tablet Take 1 tablet (5 mg total) by mouth daily. 07/01/17  Yes Boscia, Greer Ee, NP  nitroGLYCERIN (NITROSTAT) 0.4 MG SL tablet Place 1 tablet (0.4 mg total) under the tongue every 5 (five) minutes as needed for chest pain. 05/05/16  Yes Theodoro Grist, MD  [facility] VERIO test strip USE UTD TO CHECK BLOOD SUGAR ONCE D 02/03/17  Yes Leretha Pol  E, NP  PRADAXA 150 MG CAPS capsule Take 150 mg by mouth 2 (two) times daily. 04/05/16  Yes [provider]  benzonatate (TESSALON PERLES) 100 MG capsule Take 1 capsule (100 mg total) by mouth every 6 (six) hours as needed for cough. 08/06/17 08/06/18  Merlyn Lot, MD  carvedilol (COREG) 25 MG tablet Take 0.5 tablets (12.5 mg total) by mouth 2 (two) times daily with a meal. 08/06/17 09/05/17  Merlyn Lot, MD  diclofenac sodium (VOLTAREN) 1 % GEL Apply 4 g topically 4 (four) times daily. Patient not  taking: Reported on 08/01/2017 05/29/17   Versie Starks, PA-C  tretinoin (RETIN-A) 0.025 % cream Apply topically at bedtime. Patient not taking: Reported on 08/01/2017 04/10/17   Ronnell Freshwater, NP    Allergies Patient has no known allergies.    Social History Social History   Tobacco Use  . Smoking status: Former Smoker    Years: 16.00    Types: Cigarettes  . Smokeless tobacco: Never Used  Substance Use Topics  . Alcohol use: No  . Drug use: No    Review of Systems Patient denies headaches, rhinorrhea, blurry vision, numbness, shortness of breath, chest pain, edema, cough, abdominal pain, nausea, vomiting, diarrhea, dysuria, fevers, rashes or hallucinations unless otherwise stated above in HPI. ____________________________________________   PHYSICAL EXAM:  VITAL SIGNS: Vitals:   08/06/17 1135 08/06/17 1410  BP: 107/75 (!) 133/93  Pulse:  81  Resp: (!) 23   Temp:    SpO2: 97% 100%    Constitutional: Alert and oriented.  Eyes: Conjunctivae are normal.  Head: Atraumatic. Nose: No congestion/rhinnorhea. Mouth/Throat: Mucous membranes are moist.   Neck: No stridor. Painless ROM.  Cardiovascular:  tachycardia, irregularly irregular.  Grossly normal heart sounds.  Good peripheral circulation. Respiratory: Normal respiratory effort.  No retractions. Lungs CTAB. Gastrointestinal: Soft and nontender. No distention. No abdominal bruits. No CVA tenderness. Genitourinary:  Musculoskeletal: No lower extremity tenderness nor edema.  No joint effusions. Neurologic:  Normal speech and language. No gross focal neurologic deficits are appreciated. No facial droop Skin:  Skin is warm, dry and intact. No rash noted. Psychiatric: Mood and affect are normal. Speech and behavior are normal.  ____________________________________________   LABS (all labs ordered are listed, but only abnormal results are displayed)  Results for orders placed or performed during the hospital  encounter of 08/06/17 (from the past 24 hour(s))  Basic metabolic panel     Status: Abnormal   Collection Time: 08/06/17 10:53 AM  Result Value Ref Range   Sodium 136 135 - 145 mmol/L   Potassium 4.5 3.5 - 5.1 mmol/L   Chloride 100 98 - 111 mmol/L   CO2 29 22 - 32 mmol/L   Glucose, Bld 174 (H) 70 - 99 mg/dL   BUN 51 (H) 8 - 23 mg/dL   Creatinine, Ser 2.63 (H) 0.61 - 1.24 mg/dL   Calcium 9.1 8.9 - 10.3 mg/dL   GFR calc non Af Amer 21 (L) >60 mL/min   GFR calc Af Amer 24 (L) >60 mL/min   Anion gap 7 5 - 15  CBC     Status: Abnormal   Collection Time: 08/06/17 10:53 AM  Result Value Ref Range   WBC 6.8 3.8 - 10.6 K/uL   RBC 5.41 4.40 - 5.90 MIL/uL   Hemoglobin 15.9 13.0 - 18.0 g/dL   HCT 49.3 40.0 - 52.0 %   MCV 91.0 80.0 - 100.0 fL   MCH 29.5 26.0 - 34.0 pg  MCHC 32.4 32.0 - 36.0 g/dL   RDW 16.0 (H) 11.5 - 14.5 %   Platelets 220 150 - 440 K/uL  Troponin I     Status: Abnormal   Collection Time: 08/06/17 10:53 AM  Result Value Ref Range   Troponin I 0.04 (HH) <0.03 ng/mL  Brain natriuretic peptide     Status: Abnormal   Collection Time: 08/06/17 10:53 AM  Result Value Ref Range   B Natriuretic Peptide 149.0 (H) 0.0 - 100.0 pg/mL  Troponin I     Status: Abnormal   Collection Time: 08/06/17  1:53 PM  Result Value Ref Range   Troponin I 0.04 (HH) <0.03 ng/mL   ____________________________________________  EKG My review and personal interpretation at Time: 10:31   Indication: tachycardia  Rate: 125  Rhythm: afib with rvr Axis: normal Other: no stemi,  Nonspecific st abn ____________________________________________  RADIOLOGY  I personally reviewed all radiographic images ordered to evaluate for the above acute complaints and reviewed radiology reports and findings.  These findings were personally discussed with the patient.  Please see medical record for radiology report.  ____________________________________________   PROCEDURES  Procedure(s) performed:    Procedures    Critical Care performed: no ____________________________________________   INITIAL IMPRESSION / ASSESSMENT AND PLAN / ED COURSE  Pertinent labs & imaging results that were available during my care of the patient were reviewed by me and considered in my medical decision making (see chart for details).   DDX: Asthma, copd, CHF, pna, ptx, malignancy, Pe, anemia   AZARIAN STARACE is a 84 y.o. who presents to the ED with symptoms as described above.  Patient nontoxic-appearing.  Is found to be in A. fib with RVR does have a history of this and is appropriately anticoagulated on Pradaxa.  EKG shows no acute ischemic changes.  No hypoxia or no respiratory distress.  Letter will be sent for the above differential.  Will give dose of IV Lopressor as well as reducing his carvedilol for rate control.  Seems less consistent with ACS but patient is fairly high risk therefore will order serial enzymes to further risk stratify.  Clinical Course as of Aug 07 1543  Wed Aug 06, 2017  1305 She reassessed.  Heart rate is now controlled heart rate of 86.  Is well-perfused oxygenating well.  Based on blood work thus far do feel the patient's slightly dehydrated therefore will give small bolus of IV fluids and reassess.  Based on his risk factors with chest discomfort earlier today we will repeat troponin to evaluate for worsening elevation.   [PR]  1407 Discussed case with Dr. [name].  Based on his response to increasing dose of Coreg will start the patient on 25 twice daily and have patient follow-up with cardiology.  Still awaiting repeat troponin.   [PR]  0109 Patient's repeat troponin is unchanged.  Is not clinically consistent with ACS.  Heart rate is significantly improved with small IV bolus as well as restarting his Coreg.  Do feel patient will be able to tolerate increased Coreg dosing and is appropriate for outpatient follow-up.   [PR]    Clinical Course User Index [PR] Merlyn Lot, MD     As part of my medical decision making, I reviewed the following data within the [place] [facility] notes reviewed and incorporated, Labs reviewed, notes from prior ED visits and [place] Controlled Substance Database   ____________________________________________   FINAL CLINICAL IMPRESSION(S) / ED DIAGNOSES  Final diagnoses:  Atrial fibrillation with RVR ([place])      NEW MEDICATIONS STARTED DURING THIS VISIT:  Discharge Medication List as of 08/06/2017  2:58 PM       Note:  This document was prepared using Dragon voice recognition software and may include unintentional dictation errors.    Merlyn Lot, MD 08/06/17 [phone]

## 2017-08-06 NOTE — Discharge Instructions (Addendum)
Please increase your coreg to 25mg  twice daily.  Follow up with Dr. [name].  Return for worsening symptoms, questions or concerns.

## 2017-08-06 NOTE — ED Notes (Signed)
Patient recently hospitalized for similar complaint.  Released 2 days ago, returns today complaining of [facility] and Chest Pain.  Alert and oriented.

## 2017-08-06 NOTE — ED Triage Notes (Signed)
Pt recently released from the hospital with CHF and started having some increase SOB.

## 2017-08-07 ENCOUNTER — Observation Stay: Payer: Medicare Other

## 2017-08-07 ENCOUNTER — Emergency Department: Payer: Medicare Other

## 2017-08-07 ENCOUNTER — Other Ambulatory Visit: Payer: Self-pay

## 2017-08-07 ENCOUNTER — Encounter: Payer: Self-pay | Admitting: Emergency Medicine

## 2017-08-07 ENCOUNTER — Observation Stay
Admission: EM | Admit: 2017-08-07 | Discharge: 2017-08-08 | Disposition: A | Discharge status: Home / Self Care | Payer: Medicare Other | Attending: Internal Medicine | Admitting: Internal Medicine

## 2017-08-07 DIAGNOSIS — I5022 Chronic systolic (congestive) heart failure: Secondary | ICD-10-CM | POA: Diagnosis not present

## 2017-08-07 DIAGNOSIS — R059 Cough, unspecified: Secondary | ICD-10-CM

## 2017-08-07 DIAGNOSIS — Z7901 Long term (current) use of anticoagulants: Secondary | ICD-10-CM | POA: Diagnosis not present

## 2017-08-07 DIAGNOSIS — Z7984 Long term (current) use of oral hypoglycemic drugs: Secondary | ICD-10-CM | POA: Insufficient documentation

## 2017-08-07 DIAGNOSIS — Z87891 Personal history of nicotine dependence: Secondary | ICD-10-CM | POA: Insufficient documentation

## 2017-08-07 DIAGNOSIS — Z87442 Personal history of urinary calculi: Secondary | ICD-10-CM | POA: Insufficient documentation

## 2017-08-07 DIAGNOSIS — G4733 Obstructive sleep apnea (adult) (pediatric): Secondary | ICD-10-CM | POA: Diagnosis not present

## 2017-08-07 DIAGNOSIS — E782 Mixed hyperlipidemia: Secondary | ICD-10-CM | POA: Diagnosis not present

## 2017-08-07 DIAGNOSIS — I480 Paroxysmal atrial fibrillation: Secondary | ICD-10-CM | POA: Diagnosis not present

## 2017-08-07 DIAGNOSIS — I2510 Atherosclerotic heart disease of native coronary artery without angina pectoris: Secondary | ICD-10-CM | POA: Diagnosis not present

## 2017-08-07 DIAGNOSIS — N400 Enlarged prostate without lower urinary tract symptoms: Secondary | ICD-10-CM | POA: Diagnosis not present

## 2017-08-07 DIAGNOSIS — R0789 Other chest pain: Principal | ICD-10-CM | POA: Insufficient documentation

## 2017-08-07 DIAGNOSIS — R109 Unspecified abdominal pain: Secondary | ICD-10-CM | POA: Diagnosis not present

## 2017-08-07 DIAGNOSIS — Z79899 Other long term (current) drug therapy: Secondary | ICD-10-CM | POA: Diagnosis not present

## 2017-08-07 DIAGNOSIS — N183 Chronic kidney disease, stage 3 (moderate): Secondary | ICD-10-CM | POA: Insufficient documentation

## 2017-08-07 DIAGNOSIS — I4891 Unspecified atrial fibrillation: Secondary | ICD-10-CM | POA: Diagnosis not present

## 2017-08-07 DIAGNOSIS — Z8546 Personal history of malignant neoplasm of prostate: Secondary | ICD-10-CM | POA: Insufficient documentation

## 2017-08-07 DIAGNOSIS — E1122 Type 2 diabetes mellitus with diabetic chronic kidney disease: Secondary | ICD-10-CM | POA: Insufficient documentation

## 2017-08-07 DIAGNOSIS — I130 Hypertensive heart and chronic kidney disease with heart failure and stage 1 through stage 4 chronic kidney disease, or unspecified chronic kidney disease: Secondary | ICD-10-CM | POA: Insufficient documentation

## 2017-08-07 DIAGNOSIS — R05 Cough: Secondary | ICD-10-CM | POA: Diagnosis not present

## 2017-08-07 DIAGNOSIS — N179 Acute kidney failure, unspecified: Secondary | ICD-10-CM | POA: Diagnosis not present

## 2017-08-07 DIAGNOSIS — R079 Chest pain, unspecified: Secondary | ICD-10-CM | POA: Diagnosis present

## 2017-08-07 DIAGNOSIS — M109 Gout, unspecified: Secondary | ICD-10-CM | POA: Diagnosis not present

## 2017-08-07 DIAGNOSIS — K5900 Constipation, unspecified: Secondary | ICD-10-CM | POA: Diagnosis not present

## 2017-08-07 HISTORY — DX: Unspecified atrial fibrillation: I48.91

## 2017-08-07 HISTORY — DX: Heart failure, unspecified: I50.9

## 2017-08-07 LAB — GLUCOSE, CAPILLARY
Glucose-Capillary: 111 mg/dL — ABNORMAL HIGH (ref 70–99)
Glucose-Capillary: 219 mg/dL — ABNORMAL HIGH (ref 70–99)
Glucose-Capillary: 70 mg/dL (ref 70–99)
Glucose-Capillary: 121 mg/dL — ABNORMAL HIGH (ref 70–99)

## 2017-08-07 LAB — COMPREHENSIVE METABOLIC PANEL
ALT: 24 U/L (ref 0–44)
AST: 27 U/L (ref 15–41)
Albumin: 3.3 g/dL — ABNORMAL LOW (ref 3.5–5.0)
Alkaline Phosphatase: 97 U/L (ref 38–126)
Anion gap: 6 (ref 5–15)
BUN: 50 mg/dL — ABNORMAL HIGH (ref 8–23)
CO2: 28 mmol/L (ref 22–32)
Calcium: 8.6 mg/dL — ABNORMAL LOW (ref 8.9–10.3)
Chloride: 102 mmol/L (ref 98–111)
Creatinine, Ser: 2.6 mg/dL — ABNORMAL HIGH (ref 0.61–1.24)
GFR calc Af Amer: 25 mL/min — ABNORMAL LOW (ref >60)
GFR calc non Af Amer: 21 mL/min — ABNORMAL LOW (ref >60)
Glucose, Bld: 317 mg/dL — ABNORMAL HIGH (ref 70–99)
Potassium: 4.4 mmol/L (ref 3.5–5.1)
Sodium: 136 mmol/L (ref 135–145)
Total Bilirubin: 0.5 mg/dL (ref 0.3–1.2)
Total Protein: 7.2 g/dL (ref 6.5–8.1)

## 2017-08-07 LAB — APTT: aPTT: 72 seconds — ABNORMAL HIGH (ref 24–36)

## 2017-08-07 LAB — CBC
HCT: 45.1 % (ref 40.0–52.0)
Hemoglobin: 14.7 g/dL (ref 13.0–18.0)
MCH: 30.1 pg (ref 26.0–34.0)
MCHC: 32.7 g/dL (ref 32.0–36.0)
MCV: 92.1 fL (ref 80.0–100.0)
Platelets: 201 10*3/uL (ref 150–440)
RBC: 4.89 MIL/uL (ref 4.40–5.90)
RDW: 15.7 % — ABNORMAL HIGH (ref 11.5–14.5)
WBC: 6.6 10*3/uL (ref 3.8–10.6)

## 2017-08-07 LAB — PROTIME-INR
INR: 3.21
Prothrombin Time: 32.6 seconds — ABNORMAL HIGH (ref 11.4–15.2)

## 2017-08-07 LAB — LIPASE, BLOOD: Lipase: 46 U/L (ref 11–51)

## 2017-08-07 LAB — TROPONIN I
Troponin I: 0.03 ng/mL (ref <0.03)
Troponin I: 0.03 ng/mL (ref <0.03)
Troponin I: 0.03 ng/mL (ref <0.03)

## 2017-08-07 MED ORDER — INSULIN ASPART 100 UNIT/ML ~~LOC~~ SOLN: 0.0000 [IU] | Freq: Every day | SUBCUTANEOUS | Status: DC

## 2017-08-07 MED ORDER — HEPARIN BOLUS VIA INFUSION
4000.0000 [IU] | Freq: Once | INTRAVENOUS | Status: DC
  Filled 2017-08-07: qty 4000

## 2017-08-07 MED ORDER — ACETAMINOPHEN 650 MG RE SUPP: 650.0000 mg | Freq: Four times a day (QID) | RECTAL | Status: DC | PRN

## 2017-08-07 MED ORDER — ATORVASTATIN CALCIUM 20 MG PO TABS
40.0000 mg | ORAL_TABLET | Freq: Every day | ORAL | Status: DC
  Administered 2017-08-07: 40 mg via ORAL
  Filled 2017-08-07: qty 2

## 2017-08-07 MED ORDER — FAMOTIDINE IN NACL 20-0.9 MG/50ML-% IV SOLN: 20.0000 mg | Freq: Two times a day (BID) | INTRAVENOUS | Status: DC

## 2017-08-07 MED ORDER — FAMOTIDINE IN NACL 20-0.9 MG/50ML-% IV SOLN
20.0000 mg | INTRAVENOUS | Status: DC
  Administered 2017-08-07: 20 mg via INTRAVENOUS
  Filled 2017-08-07: qty 50

## 2017-08-07 MED ORDER — ACETAMINOPHEN 325 MG PO TABS: 650.0000 mg | ORAL_TABLET | Freq: Four times a day (QID) | ORAL | Status: DC | PRN

## 2017-08-07 MED ORDER — INSULIN ASPART 100 UNIT/ML ~~LOC~~ SOLN
0.0000 [IU] | Freq: Three times a day (TID) | SUBCUTANEOUS | Status: DC
  Administered 2017-08-07: 3 [IU] via SUBCUTANEOUS
  Filled 2017-08-07: qty 1

## 2017-08-07 MED ORDER — SODIUM CHLORIDE 0.9 % IV BOLUS
500.0000 mL | Freq: Once | INTRAVENOUS | Status: AC
  Administered 2017-08-07: 500 mL via INTRAVENOUS

## 2017-08-07 MED ORDER — METOPROLOL TARTRATE 5 MG/5ML IV SOLN: 5.0000 mg | INTRAVENOUS | Status: DC | PRN

## 2017-08-07 MED ORDER — HEPARIN (PORCINE) IN NACL 100-0.45 UNIT/ML-% IJ SOLN: 1400.0000 [IU]/h | INTRAMUSCULAR | Status: DC

## 2017-08-07 MED ORDER — CARVEDILOL 6.25 MG PO TABS
12.5000 mg | ORAL_TABLET | Freq: Two times a day (BID) | ORAL | Status: DC
  Administered 2017-08-07 – 2017-08-08 (×2): 12.5 mg via ORAL
  Filled 2017-08-07 (×2): qty 2

## 2017-08-07 MED ORDER — GLIPIZIDE 10 MG PO TABS
10.0000 mg | ORAL_TABLET | Freq: Two times a day (BID) | ORAL | Status: DC
  Administered 2017-08-07: 10 mg via ORAL
  Filled 2017-08-07 (×2): qty 1

## 2017-08-07 MED ORDER — HYDROCODONE-ACETAMINOPHEN 5-325 MG PO TABS: 1.0000 | ORAL_TABLET | ORAL | Status: DC | PRN

## 2017-08-07 MED ORDER — PANTOPRAZOLE SODIUM 40 MG PO TBEC
40.0000 mg | DELAYED_RELEASE_TABLET | Freq: Every day | ORAL | Status: DC
  Administered 2017-08-07: 40 mg via ORAL
  Filled 2017-08-07: qty 1

## 2017-08-07 NOTE — ED Triage Notes (Signed)
Arrives via [facility] from home.  C/O CP this morning after eating breakfast.  Has history of CHF, AFIB, DM  Seen through ED yesterday for c/o abdominal pain.  325 mg ASA given.  CBG:  457.  #18 LAC-- 200 cc NS infused.

## 2017-08-07 NOTE — Consult Note (Signed)
[facility]  CARDIOLOGY CONSULT NOTE  Patient ID: [name] MRN: [id number] DOB/AGE: 82-Jan-1935 82 y.o.  Admit date: 08/07/2017 Referring Physician Leslye Peer Primary Physician Leretha Pol Primary Cardiologist [name] Reason for Consultation Chest pain  HPI: 84 year old male referred for evaluation of chest pain.  The patient has a history of chronic atrial fibrillation, hypertension, type 2 diabetes, chronic kidney disease, hyperlipidemia, and chronic systolic congestive heart failure.  The patient reports experiencing sharp, stabbing left-sided chest pain this morning while watching TV. He took 4 aspirin with some improvement. His wife probably called EMS and the patient was brought to [facility] ER.  The patient does not recall how long the chest pain lasted.  ECG revealed atrial fibrillation and a rate of 132 bpm without acute ST or T wave abnormalities.  Admission labs notable for troponin 0.03 x 2, creatinine 2.60, BUN 50.  Chest x-ray revealed low lower lung volumes compared to the one performed yesterday, but otherwise no acute cardiopulmonary abnormality. Patient was recently discharged from [facility] on 08/04/2017 after treatment for acute on chronic CHF and atrial fibrillation with RVR.  The patient was evaluated at [facility] ER yesterday, on 08/06/2017 for shortness of breath and chest discomfort, noted to be in atrial fibrillation with RVR at a rate of 125 bpm, treated with IV Lopressor and IV fluids, and was discharged home with improved rate.  2D echocardiogram on 08/02/2017 revealed LVEF 35 to 40% area the patient underwent a Lexiscan Myoview on 06/10/2016 which revealed LVEF 45% without evidence of ischemia.  Currently, the patient denies recurrent chest pain or shortness of breath while lying at a 13 degree angle in bed.  He does state that he wants to eat and take a nap.  Review of systems complete and found to be negative unless listed above     Past Medical History:  Diagnosis Date   . Diabetes ([place])   . Hearing loss   . High blood pressure   . History of bladder problems   . Kidney stones 11/22/2014    Past Surgical History:  Procedure Laterality Date  . PROSTATE CANCER    . PROSTATE SURGERY      Medications Prior to Admission  Medication Sig Dispense Refill Last Dose  . atorvastatin (LIPITOR) 40 MG tablet Take 1 tablet (40 mg total) by mouth daily at 6 PM. 30 tablet 1 08/06/2017 at 1900  . benzonatate (TESSALON PERLES) 100 MG capsule Take 1 capsule (100 mg total) by mouth every 6 (six) hours as needed for cough. 20 capsule 0 PRN at PRN  . carvedilol (COREG) 25 MG tablet Take 0.5 tablets (12.5 mg total) by mouth 2 (two) times daily with a meal. 30 tablet 0 08/07/2017 at 0700  . chlorpheniramine-HYDROcodone (TUSSIONEX) 10-8 MG/5ML SUER Take 5 mLs by mouth every 12 (twelve) hours as needed for cough. 115 mL 0 PRN at PRN  . furosemide (LASIX) 20 MG tablet Take 1 tablet (20 mg total) by mouth 2 (two) times daily. 60 tablet 1 08/07/2017 at 0700  . glipiZIDE (GLUCOTROL) 10 MG tablet Take 1 tablet (10 mg total) by mouth 2 (two) times daily. 180 tablet 0 08/07/2017 at 0700  . lisinopril (PRINIVIL,ZESTRIL) 5 MG tablet Take 1 tablet (5 mg total) by mouth daily. 90 tablet 3 08/07/2017 at 0700  . nitroGLYCERIN (NITROSTAT) 0.4 MG SL tablet Place 1 tablet (0.4 mg total) under the tongue every 5 (five) minutes as needed for chest pain. 30 tablet 12 PRN at PRN  . ONETOUCH  VERIO test strip USE UTD TO CHECK BLOOD SUGAR ONCE D 100 each 5 As directed at As directed  . PRADAXA 150 MG CAPS capsule Take 150 mg by mouth 2 (two) times daily.  4 08/07/2017 at 0700   Social History   Socioeconomic History  . Marital status: Married    Spouse name: Not on file  . Number of children: Not on file  . Years of education: Not on file  . Highest education level: Not on file  Occupational History  . Not on file  Social Needs  . Financial resource strain: Not on file  . Food insecurity:    Worry:  Not on file    Inability: Not on file  . Transportation needs:    Medical: Not on file    Non-medical: Not on file  Tobacco Use  . Smoking status: Former Smoker    Years: 16.00    Types: Cigarettes  . Smokeless tobacco: Never Used  Substance and Sexual Activity  . Alcohol use: No  . Drug use: No  . Sexual activity: Not on file  Lifestyle  . Physical activity:    Days per week: Not on file    Minutes per session: Not on file  . Stress: Not on file  Relationships  . Social connections:    Talks on phone: Not on file    Gets together: Not on file    Attends religious service: Not on file    Active member of club or organization: Not on file    Attends meetings of clubs or organizations: Not on file    Relationship status: Not on file  . Intimate partner violence:    Fear of current or ex partner: Not on file    Emotionally abused: Not on file    Physically abused: Not on file    Forced sexual activity: Not on file  Other Topics Concern  . Not on file  Social History Narrative  . Not on file    Family History  Problem Relation Age of Onset  . Cancer Mother   . Diabetes Unknown   . High blood pressure Unknown   . Prostate cancer Brother       Review of systems complete and found to be negative unless listed above      PHYSICAL EXAM  General: Well developed, well nourished, in no acute distress, lying at 13 degree angle in bed HEENT:  Normocephalic and atramatic Neck:  No JVD.  Lungs: Decreased breath sounds bilateral lower lobes, no wheezing. Normal effort of breathing on room air. Heart: irregularly irregular Abdomen: Bowel sounds are positive, abdomen nondistended Msk:  Back normal, able to sit upright in bed without difficulty. Normal strength and tone for age. Extremities: No clubbing, cyanosis or edema.   Neuro: Alert and oriented X 3. Psych:  Good affect, responds appropriately  Labs:   Lab Results  Component Value Date   WBC 6.6 08/07/2017   HGB  14.7 08/07/2017   HCT 45.1 08/07/2017   MCV 92.1 08/07/2017   PLT 201 08/07/2017    Recent Labs  Lab 08/07/17 0920  NA 136  K 4.4  CL 102  CO2 28  BUN 50*  CREATININE 2.60*  CALCIUM 8.6*  PROT 7.2  BILITOT 0.5  ALKPHOS 97  ALT 24  AST 27  GLUCOSE 317*   Lab Results  Component Value Date   CKTOTAL 70 10/12/2013   CKMB 2.1 10/12/2013   TROPONINI 0.03 (HH) 08/07/2017  Lab Results  Component Value Date   CHOL 97 (L) 06/27/2017   CHOL 140 10/12/2013   CHOL 125 06/13/2013   Lab Results  Component Value Date   HDL 36 (L) 06/27/2017   HDL 43 10/12/2013   HDL 39 (L) 06/13/2013   Lab Results  Component Value Date   LDLCALC 50 06/27/2017   LDLCALC 69 10/12/2013   LDLCALC 61 06/13/2013   Lab Results  Component Value Date   TRIG 55 06/27/2017   TRIG 139 10/12/2013   TRIG 123 06/13/2013   No results found for: CHOLHDL No results found for: LDLDIRECT    Radiology: Dg Chest 2 View  Result Date: 08/06/2017 CLINICAL DATA:  Chest pain and shortness of breath. EXAM: CHEST - 2 VIEW COMPARISON:  Chest x-ray dated August 01, 2017. FINDINGS: Stable cardiomegaly. Normal pulmonary vascularity. Bibasilar atelectasis. Unchanged elevation of the left hemidiaphragm. No focal consolidation, pleural effusion, or pneumothorax. No acute osseous abnormality. IMPRESSION: No active cardiopulmonary disease. Electronically Signed   By: Titus Dubin M.D.   On: 08/06/2017 11:34   Dg Chest Port 1 View  Result Date: 08/07/2017 CLINICAL DATA:  84 year old male with acute chest pain this morning after breakfast. Evaluated yesterday for abdominal pain. EXAM: PORTABLE CHEST 1 VIEW COMPARISON:  Chest radiograph 08/06/2017 and earlier. FINDINGS: Portable AP semi upright view at 0903 hours. Mildly lower lung volumes. Stable cardiomegaly and mediastinal contours. Overall stable ventilation with mild chronic scarring at the left lung base. No pneumothorax. No pneumoperitoneum identified. Visualized  tracheal air column is within normal limits. Negative visible bowel gas pattern. IMPRESSION: Low lower lung volumes compared to yesterday, otherwise no acute cardiopulmonary abnormality. Electronically Signed   By: Genevie Ann M.D.   On: 08/07/2017 09:51   Dg Chest Portable 1 View  Result Date: 08/01/2017 CLINICAL DATA:  Cough for the past several weeks. Shortness of breath. EXAM: PORTABLE CHEST 1 VIEW COMPARISON:  Chest x-ray and CT chest dated May 04, 2016. FINDINGS: Stable mild cardiomegaly. Normal pulmonary vascularity. Unchanged elevation of the left hemidiaphragm and scarring at the left lung base. No focal consolidation, pleural effusion, or pneumothorax. No acute osseous abnormality. IMPRESSION: No active disease. Electronically Signed   By: Titus Dubin M.D.   On: 08/01/2017 20:52    EKG: Atrial fibrillation, rate 113 bpm  ASSESSMENT AND PLAN:  1. Atypical chest pain without recurrence with negative troponin x 2, now resolved, without acute ST or T wave abnormalities 2. Chronic systolic congestive heart failure, does not appear to be fluid overloaded. Lying nearly supine in bed. 2D echocardiogram reveals LVEF 35-40% on 08/02/17.  3. Chronic atrial fibrillation, on Pradaxa, rate 90s bpm, on carvedilol. Pradaxa being held due to acute kidney injury.  4. Hypertension, blood pressure well controlled now 5. Chronic kidney disease, creatinine 2.60. Pradaxa being held.   Plan: 1. Agree with holding Pradaxa until kidney function stabilizes 2. Defer heparin drip at this time. 3. Continue carvedilol 12.5 mg BID for now   Signed: Clabe Seal PA-C 08/07/2017, 1:24 PM

## 2017-08-07 NOTE — H&P (Signed)
[place] [facility] [place] NAME: [name]    MR#:  [id number]  DATE OF BIRTH:  10-Jul-1933  DATE OF ADMISSION:  08/07/2017  PRIMARY CARE PHYSICIAN: Ronnell Freshwater, NP   REQUESTING/REFERRING PHYSICIAN: Dr Lavonia Drafts  CHIEF COMPLAINT:   Chief Complaint  Patient presents with  . Chest Pain    HISTORY OF PRESENT ILLNESS:  [name]  is a 84 y.o. male with a known history of atrial fibrillation, congestive heart failure.  He presents to the ER with chest pain.  He states that he was recently in the hospital for fluid in his legs and told he had heart failure.  He was diuresed.  They brought him back in with some shortness of breath yesterday and was sent home.  This morning he ate some breakfast and then developed some 6 out of 10 chest pain in his left lower chest left upper abdomen area.  Associated with some shortness of breath.  He stated he took 4 aspirin and seemed to get a little bit better.  No sweating.  No nausea or vomiting.  Hospitalist services were contacted for further evaluation.  PAST MEDICAL HISTORY:   Past Medical History:  Diagnosis Date  . Atrial fibrillation ([place])   . CHF (congestive heart failure) ([place])   . Diabetes ([place])   . Hearing loss   . High blood pressure   . History of bladder problems   . Kidney stones 11/22/2014    PAST SURGICAL HISTORY:   Past Surgical History:  Procedure Laterality Date  . CATARACT EXTRACTION    . PROSTATE CANCER    . PROSTATE SURGERY      SOCIAL HISTORY:   Social History   Tobacco Use  . Smoking status: Former Smoker    Years: 16.00    Types: Cigarettes  . Smokeless tobacco: Never Used  Substance Use Topics  . Alcohol use: No    FAMILY HISTORY:   Family History  Problem Relation Age of Onset  . Colon cancer Mother   . Diabetes Unknown   . High blood pressure Unknown   . Prostate cancer Brother   . Diabetes Brother     DRUG ALLERGIES:  No  Known Allergies  REVIEW OF SYSTEMS:  CONSTITUTIONAL: No fever, fatigue or weakness.  EYES: Some blurry vision on Sunday EARS, NOSE, AND THROAT: No tinnitus or ear pain. No sore throat.  Positive for runny nose.  Occasional dysphasia. RESPIRATORY: No cough positive for . shortness of breath.  No wheezing or hemoptysis.  CARDIOVASCULAR: Positive for chest pain, history of edema.  GASTROINTESTINAL: No nausea, vomiting, diarrhea.  Some abdominal pain. No blood in bowel movements GENITOURINARY: No dysuria, hematuria.  ENDOCRINE: No polyuria, nocturia,  HEMATOLOGY: No anemia, easy bruising or bleeding SKIN: No rash or lesion. MUSCULOSKELETAL: No joint pain or arthritis.   NEUROLOGIC: No tingling, numbness, weakness.  PSYCHIATRY: No anxiety or depression.   MEDICATIONS AT HOME:   Prior to Admission medications   Medication Sig Start Date End Date Taking? Authorizing Provider  atorvastatin (LIPITOR) 40 MG tablet Take 1 tablet (40 mg total) by mouth daily at 6 PM. 08/04/17  Yes Demetrios Loll, MD  benzonatate (TESSALON PERLES) 100 MG capsule Take 1 capsule (100 mg total) by mouth every 6 (six) hours as needed for cough. 08/06/17 08/06/18 Yes Merlyn Lot, MD  carvedilol (COREG) 25 MG tablet Take 0.5 tablets (12.5 mg total) by mouth 2 (two) times daily with a meal.  08/06/17 09/05/17 Yes Merlyn Lot, MD  chlorpheniramine-HYDROcodone (TUSSIONEX) 10-8 MG/5ML SUER Take 5 mLs by mouth every 12 (twelve) hours as needed for cough. 08/04/17  Yes Demetrios Loll, MD  furosemide (LASIX) 20 MG tablet Take 1 tablet (20 mg total) by mouth 2 (two) times daily. 08/04/17  Yes Demetrios Loll, MD  glipiZIDE (GLUCOTROL) 10 MG tablet Take 1 tablet (10 mg total) by mouth 2 (two) times daily. 06/04/17  Yes Boscia, Heather E, NP  lisinopril (PRINIVIL,ZESTRIL) 5 MG tablet Take 1 tablet (5 mg total) by mouth daily. 07/01/17  Yes Boscia, Greer Ee, NP  nitroGLYCERIN (NITROSTAT) 0.4 MG SL tablet Place 1 tablet (0.4 mg total) under the  tongue every 5 (five) minutes as needed for chest pain. 05/05/16  Yes Theodoro Grist, MD  [facility] VERIO test strip USE UTD TO CHECK BLOOD SUGAR ONCE D 02/03/17  Yes Boscia, Heather E, NP  PRADAXA 150 MG CAPS capsule Take 150 mg by mouth 2 (two) times daily. 04/05/16  Yes [provider]      VITAL SIGNS:  Blood pressure 115/80, pulse (!) 56, temperature 97.8 F (36.6 C), temperature source Oral, resp. rate 18, height 6' (1.829 m), weight 112.7 kg (248 lb 8 oz), SpO2 96 %. Patient's heart rate is rapid atrial fibrillation at 118 bpm.  The pulse rate that was taken with the vital signs is coming off the blood pressure cuff which is inaccurate. PHYSICAL EXAMINATION:  GENERAL:  84 y.o.-year-old patient lying in the bed with no acute distress.  EYES: Pupils equal, round, reactive to light and accommodation. No scleral icterus. Extraocular muscles intact.  HEENT: Head atraumatic, normocephalic. Oropharynx and nasopharynx clear.  NECK:  Supple, no jugular venous distention. No thyroid enlargement, no tenderness.  LUNGS: Normal breath sounds bilaterally, no wheezing, rales,rhonchi or crepitation. No use of accessory muscles of respiration.  CARDIOVASCULAR: S1, S2 irregularly irregular with tachycardia. No murmurs, rubs, or gallops.  ABDOMEN: Soft, nontender, nondistended. Bowel sounds present. No organomegaly or mass.  EXTREMITIES:  2+ pedal edema.  No cyanosis, or clubbing.  NEUROLOGIC: Cranial nerves II through XII are intact. Muscle strength 5/5 in all extremities. Sensation intact. Gait not checked.  PSYCHIATRIC: The patient is alert and oriented x 3.  SKIN: No rash, lesion, or ulcer.   LABORATORY PANEL:   CBC Recent Labs  Lab 08/07/17 0920  WBC 6.6  HGB 14.7  HCT 45.1  PLT 201   ------------------------------------------------------------------------------------------------------------------  Chemistries  Recent Labs  Lab 08/04/17 0404  08/07/17 0920  NA 135   < > 136  K  4.2   < > 4.4  CL 101   < > 102  CO2 26   < > 28  GLUCOSE 130*   < > 317*  BUN 36*   < > 50*  CREATININE 1.90*   < > 2.60*  CALCIUM 8.8*   < > 8.6*  MG 1.9  --   --   AST  --   --  27  ALT  --   --  24  ALKPHOS  --   --  97  BILITOT  --   --  0.5   < > = values in this interval not displayed.   ------------------------------------------------------------------------------------------------------------------  Cardiac Enzymes Recent Labs  Lab 08/07/17 0920  TROPONINI 0.03*   ------------------------------------------------------------------------------------------------------------------  RADIOLOGY:  Dg Chest 2 View  Result Date: 08/06/2017 CLINICAL DATA:  Chest pain and shortness of breath. EXAM: CHEST - 2 VIEW COMPARISON:  Chest x-ray dated August 01, 2017. FINDINGS: Stable cardiomegaly. Normal pulmonary vascularity. Bibasilar atelectasis. Unchanged elevation of the left hemidiaphragm. No focal consolidation, pleural effusion, or pneumothorax. No acute osseous abnormality. IMPRESSION: No active cardiopulmonary disease. Electronically Signed   By: Titus Dubin M.D.   On: 08/06/2017 11:34   Dg Chest Port 1 View  Result Date: 08/07/2017 CLINICAL DATA:  84 year old male with acute chest pain this morning after breakfast. Evaluated yesterday for abdominal pain. EXAM: PORTABLE CHEST 1 VIEW COMPARISON:  Chest radiograph 08/06/2017 and earlier. FINDINGS: Portable AP semi upright view at 0903 hours. Mildly lower lung volumes. Stable cardiomegaly and mediastinal contours. Overall stable ventilation with mild chronic scarring at the left lung base. No pneumothorax. No pneumoperitoneum identified. Visualized tracheal air column is within normal limits. Negative visible bowel gas pattern. IMPRESSION: Low lower lung volumes compared to yesterday, otherwise no acute cardiopulmonary abnormality. Electronically Signed   By: Genevie Ann M.D.   On: 08/07/2017 09:51    EKG:   Atrial fibrillation 132  bpm  IMPRESSION AND PLAN:   1.  Atrial fibrillation with rapid ventricular response.  Continue Coreg.  PRN metoprolol.  We will give fluid bolus.  Add on a TSH.  Pradaxa on hold with his acute kidney injury.  Heparin drip ordered. 2.  Chest pain and upper abdominal pain.  Atypical chest pain.  First troponin negative.  Monitor on telemetry and cardiology consultation.  This could be upper abdominal pain will give medications for acid reflux and get an abdominal x-ray.  Add on a lipase. 3.  Acute kidney injury On chronic kidney disease stage III.  We will give a fluid bolus and hold Lasix right now. 4.  History of systolic congestive heart failure.  Hold Lasix and watch closely. 5.  Type 2 diabetes mellitus.  Holding oral medication and put on sliding scale.   All the records are reviewed and case discussed with ED provider. Management plans discussed with the patient, family and they are in agreement.  CODE STATUS: Full code  TOTAL TIME TAKING CARE OF THIS PATIENT: 50 minutes.    Loletha Grayer M.D on 08/07/2017 at 2:18 PM  Between 7am to 6pm - Pager - [phone]  After 6pm call admission pager [phone]  [facility] Office  [phone]  CC: Primary care physician; Ronnell Freshwater, NP

## 2017-08-07 NOTE — ED Notes (Signed)
Date and time results received: 08/07/17 10:05 AM   Test: troponin I Critical Value: 0.03  Name of Provider Notified: dr. Corky Downs  Orders Received? Or Actions Taken?: no new orders at this time

## 2017-08-07 NOTE — Progress Notes (Signed)
ANTICOAGULATION CONSULT NOTE - Initial Consult  Pharmacy Consult for Heparin Drip Indication: atrial fibrillation  No Known Allergies  Patient Measurements: Height: 6' (182.9 cm) Weight: 239 lb (108.4 kg) IBW/kg (Calculated) : 77.6 Heparin Dosing Weight: 100.4 kg  Vital Signs: Temp: 97.6 F (36.4 C) (07/18 0920) Temp Source: Oral (07/18 0920) BP: 104/72 (07/18 1218) Pulse Rate: 114 (07/18 1218)  Labs: Recent Labs    08/06/17 1053 08/06/17 1353 08/07/17 0920  HGB 15.9  --  14.7  HCT 49.3  --  45.1  PLT 220  --  201  CREATININE 2.63*  --  2.60*  TROPONINI 0.04* 0.04* 0.03*    Estimated Creatinine Clearance: 27.9 mL/min (A) (by C-G formula based on SCr of 2.6 mg/dL (H)).   Medical History: Past Medical History:  Diagnosis Date  . Diabetes ([place])   . Hearing loss   . High blood pressure   . History of bladder problems   . Kidney stones 11/22/2014    Medications:  Scheduled:  . atorvastatin  40 mg Oral q1800  . carvedilol  12.5 mg Oral BID WC  . glipiZIDE  10 mg Oral BID  . insulin aspart  0-5 Units Subcutaneous QHS  . insulin aspart  0-9 Units Subcutaneous TID WC    Assessment: 84 yo M to start Heparin Drip for Atrial Fibrillation, elevated troponin. Patient on Pradaxa (dabigatran) at home. Pateient took last dose of Pradaxa this am 7/18 @ ~0630 (verified thru [facility]). Hgb 14.7  Plt 201  INR pending     APTT Pending  Scr 2.60  Crcl 29.7 ml/min   Goal of Therapy:  Heparin level 0.3-0.7 units/ml Monitor platelets by anticoagulation protocol: Yes   Plan:  Based on timing of Pradaxa dose PTA and Crcl < 30 ml/min:  Will start Heparin Drip 24 hours after the last Pradaxa dose. Will order Heparin drip to start 7/19 @ 0630 with bolus of 4000 units and drip at 1400 units/hr. Will check Heparin level 8 hours after initation of drip.  Zuzanna Maroney A 08/07/2017,12:25 PM

## 2017-08-07 NOTE — Progress Notes (Signed)
Pharmacy Renal adjustment Note:  Patient ordered Famotidine 20 mg IV Q12h. Scr 2.60  Crcl 28.4 ml/min  Will adjust Famotidine to 20 mg IV q24h for Crcl < 50 ml/min.  Chinita Greenland PharmD Clinical Pharmacist 08/07/2017

## 2017-08-07 NOTE — ED Provider Notes (Signed)
[facility] Emergency Department Provider Note   ____________________________________________    I have reviewed the triage vital signs and the nursing notes.   HISTORY  Chief Complaint Chest Pain     HPI [name] is a 84 y.o. male with a history of diabetes, coronary artery disease, A. fib, CHF who presents with complaints of chest pain.  Patient reports he was feeling well when he woke up this morning but while he was watching TV he developed 8 out of 10 chest pain in his left lower chest.  This has improved and now he says it is a 4 out of 10.  Patient was seen here yesterday in A. fib with RVR was treated in conjunction with his cardiologist.  He denies significant changes in his breathing. no fevers or chills.  Past Medical History:  Diagnosis Date  . Diabetes ([place])   . Hearing loss   . High blood pressure   . History of bladder problems   . Kidney stones 11/22/2014    Patient Active Problem List   Diagnosis Date Noted  . Atrial fibrillation with RVR ([place]) 08/02/2017  . Acute CHF (congestive heart failure) ([place]) 08/02/2017  . Prostatic hypertrophy 07/20/2017  . Dysuria 07/20/2017  . Acute gout of left ankle 04/10/2017  . Hyperpigmentation 04/10/2017  . Uncontrolled type 2 diabetes mellitus with hyperglycemia ([place]) 03/24/2017  . Encounter for general adult medical examination with abnormal findings 03/24/2017  . Chest pain 05/04/2016  . Elevated troponin 05/04/2016  . Essential hypertension 07/06/2014  . Cough 03/21/2014  . OSA (obstructive sleep apnea) 03/21/2014  . Paroxysmal A-fib ([place]) 03/21/2014  . Hx of adenomatous colonic polyps 06/28/2013  . Liver cyst 06/28/2013  . Chronic kidney disease 06/08/2013  . Diabetes ([place]) 06/08/2013  . Hyperlipidemia, mixed 06/08/2013  . Valvular heart disease 06/08/2013    Past Surgical History:  Procedure Laterality Date  . PROSTATE CANCER    . PROSTATE SURGERY      Prior to  Admission medications   Medication Sig Start Date End Date Taking? Authorizing Provider  atorvastatin (LIPITOR) 40 MG tablet Take 1 tablet (40 mg total) by mouth daily at 6 PM. 08/04/17   Demetrios Loll, MD  benzonatate (TESSALON PERLES) 100 MG capsule Take 1 capsule (100 mg total) by mouth every 6 (six) hours as needed for cough. 08/06/17 08/06/18  Merlyn Lot, MD  carvedilol (COREG) 25 MG tablet Take 0.5 tablets (12.5 mg total) by mouth 2 (two) times daily with a meal. 08/06/17 09/05/17  Merlyn Lot, MD  chlorpheniramine-HYDROcodone (TUSSIONEX) 10-8 MG/5ML SUER Take 5 mLs by mouth every 12 (twelve) hours as needed for cough. 08/04/17   Demetrios Loll, MD  diclofenac sodium (VOLTAREN) 1 % GEL Apply 4 g topically 4 (four) times daily. Patient not taking: Reported on 08/01/2017 05/29/17   Versie Starks, PA-C  furosemide (LASIX) 20 MG tablet Take 1 tablet (20 mg total) by mouth 2 (two) times daily. 08/04/17   Demetrios Loll, MD  glipiZIDE (GLUCOTROL) 10 MG tablet Take 1 tablet (10 mg total) by mouth 2 (two) times daily. 06/04/17   Ronnell Freshwater, NP  lisinopril (PRINIVIL,ZESTRIL) 5 MG tablet Take 1 tablet (5 mg total) by mouth daily. 07/01/17   Ronnell Freshwater, NP  nitroGLYCERIN (NITROSTAT) 0.4 MG SL tablet Place 1 tablet (0.4 mg total) under the tongue every 5 (five) minutes as needed for chest pain. 05/05/16   Theodoro Grist, MD  [facility] VERIO test strip USE  UTD TO CHECK BLOOD SUGAR ONCE D 02/03/17   Boscia, Heather E, NP  PRADAXA 150 MG CAPS capsule Take 150 mg by mouth 2 (two) times daily. 04/05/16   [provider]  tretinoin (RETIN-A) 0.025 % cream Apply topically at bedtime. Patient not taking: Reported on 08/01/2017 04/10/17   Ronnell Freshwater, NP     Allergies Patient has no known allergies.  Family History  Problem Relation Age of Onset  . Cancer Mother   . Diabetes Unknown   . High blood pressure Unknown   . Prostate cancer Brother     Social History Social History   Tobacco  Use  . Smoking status: Former Smoker    Years: 16.00    Types: Cigarettes  . Smokeless tobacco: Never Used  Substance Use Topics  . Alcohol use: No  . Drug use: No    Review of Systems  Constitutional: No fever/chills Eyes: No visual changes.  ENT: No sore throat. Cardiovascular: As above Respiratory: Chronically short of breath Gastrointestinal: No abdominal pain.  No nausea, no vomiting.   Genitourinary: Negative for dysuria. Musculoskeletal: Negative for back pain. Skin: Negative for rash. Neurological: Negative for headaches   ____________________________________________   PHYSICAL EXAM:  VITAL SIGNS: ED Triage Vitals  Enc Vitals Group     BP 08/07/17 0921 (!) 130/103     Pulse Rate 08/07/17 0920 (!) 124     Resp 08/07/17 0920 (!) 21     Temp 08/07/17 0920 97.6 F (36.4 C)     Temp Source 08/07/17 0920 Oral     SpO2 --      Weight 08/07/17 0919 108.4 kg (239 lb)     Height 08/07/17 0919 1.829 m (6')     Head Circumference --      Peak Flow --      Pain Score 08/07/17 0918 0     Pain Loc --      Pain Edu? --      Excl. in [place]? --     Constitutional: Alert and oriented.  Eyes: Conjunctivae are normal.   Nose: No congestion/rhinnorhea. Mouth/Throat: Mucous membranes are moist.    Cardiovascular: Tachycardia, irregularly irregular. Grossly normal heart sounds.  Good peripheral circulation.  No chest wall tenderness Respiratory: Normal respiratory effort.  No retractions. Gastrointestinal: Soft and nontender. No distention.   Genitourinary: deferred Musculoskeletal:  Warm and well perfused Neurologic:  Normal speech and language. No gross focal neurologic deficits are appreciated.  Skin:  Skin is warm, dry and intact. No rash noted. Psychiatric: Mood and affect are normal. Speech and behavior are normal.  ____________________________________________   LABS (all labs ordered are listed, but only abnormal results are displayed)  Labs Reviewed    COMPREHENSIVE METABOLIC PANEL - Abnormal; Notable for the following components:      Result Value   Glucose, Bld 317 (*)    BUN 50 (*)    Creatinine, Ser 2.60 (*)    Calcium 8.6 (*)    Albumin 3.3 (*)    GFR calc non Af Amer 21 (*)    GFR calc Af Amer 25 (*)    All other components within normal limits  CBC - Abnormal; Notable for the following components:   RDW 15.7 (*)    All other components within normal limits  TROPONIN I - Abnormal; Notable for the following components:   Troponin I 0.03 (*)    All other components within normal limits   ____________________________________________  EKG  ED  ECG REPORT I, Lavonia Drafts, the attending physician, personally viewed and interpreted this ECG.  Date: 08/07/2017  Rate: 132 Rhythm: Atrial fibrillation QRS Axis: normal Intervals: Abnormal ST/T Wave abnormalities: normal   ____________________________________________  RADIOLOGY  Chest x-ray unremarkable ____________________________________________   PROCEDURES  Procedure(s) performed: No  Procedures   Critical Care performed: No ____________________________________________   INITIAL IMPRESSION / ASSESSMENT AND PLAN / ED COURSE  Pertinent labs & imaging results that were available during my care of the patient were reviewed by me and considered in my medical decision making (see chart for details).  Patient returns to the ED after being seen yesterday, now with chest pain.  We will send labs, chest x-ray and placed in the monitor.  Heart rate is quite variable initially tachycardic but now in the low 100s and given his history of atrial fibrillation this is reassuring  Lab work is overall not significantly changed from prior.  Patient does feel better though has a very mild chest discomfort, given this is a second visit in 2 days will discuss with hospitalist for admission and observation    ____________________________________________   FINAL CLINICAL  IMPRESSION(S) / ED DIAGNOSES  Final diagnoses:  Chest pain, unspecified type        Note:  This document was prepared using Dragon voice recognition software and may include unintentional dictation errors.    Lavonia Drafts, MD 08/07/17 1101

## 2017-08-07 NOTE — ED Notes (Signed)
Unable to print yellow lab specimen label d/t computer giving error code. Lab notified chart labels will be sent for this patient.

## 2017-08-07 NOTE — Progress Notes (Signed)
Talked to pharmacy about patient's order to start Heparin drip tomorrow morning. Patient's no complaints of chest pain. Troponin has been 0.03 x 3. Patient's pradaxa on hold due to AKI. Per Vicente Males, [place] to defer heparin drip at this time. Pharmacy will paged cardiology on call to clarify the order. RN will continue to monitor.

## 2017-08-08 ENCOUNTER — Other Ambulatory Visit (INDEPENDENT_AMBULATORY_CARE_PROVIDER_SITE_OTHER): Payer: Medicare Other

## 2017-08-08 ENCOUNTER — Observation Stay: Payer: Medicare Other

## 2017-08-08 ENCOUNTER — Ambulatory Visit (INDEPENDENT_AMBULATORY_CARE_PROVIDER_SITE_OTHER): Payer: Medicare Other | Admitting: Vascular Surgery

## 2017-08-08 DIAGNOSIS — I4891 Unspecified atrial fibrillation: Secondary | ICD-10-CM | POA: Diagnosis not present

## 2017-08-08 DIAGNOSIS — N179 Acute kidney failure, unspecified: Secondary | ICD-10-CM | POA: Diagnosis not present

## 2017-08-08 DIAGNOSIS — R079 Chest pain, unspecified: Secondary | ICD-10-CM | POA: Diagnosis not present

## 2017-08-08 DIAGNOSIS — R109 Unspecified abdominal pain: Secondary | ICD-10-CM | POA: Diagnosis not present

## 2017-08-08 DIAGNOSIS — R05 Cough: Secondary | ICD-10-CM | POA: Diagnosis not present

## 2017-08-08 LAB — BASIC METABOLIC PANEL
Anion gap: 6 (ref 5–15)
BUN: 46 mg/dL — ABNORMAL HIGH (ref 8–23)
CO2: 28 mmol/L (ref 22–32)
Calcium: 8.5 mg/dL — ABNORMAL LOW (ref 8.9–10.3)
Chloride: 106 mmol/L (ref 98–111)
Creatinine, Ser: 2.38 mg/dL — ABNORMAL HIGH (ref 0.61–1.24)
GFR calc Af Amer: 28 mL/min — ABNORMAL LOW (ref >60)
GFR calc non Af Amer: 24 mL/min — ABNORMAL LOW (ref >60)
Glucose, Bld: 78 mg/dL (ref 70–99)
Potassium: 4.6 mmol/L (ref 3.5–5.1)
Sodium: 140 mmol/L (ref 135–145)

## 2017-08-08 LAB — GLUCOSE, CAPILLARY
Glucose-Capillary: 78 mg/dL (ref 70–99)
Glucose-Capillary: 164 mg/dL — ABNORMAL HIGH (ref 70–99)

## 2017-08-08 LAB — CBC
HCT: 43 % (ref 40.0–52.0)
Hemoglobin: 14.6 g/dL (ref 13.0–18.0)
MCH: 30.7 pg (ref 26.0–34.0)
MCHC: 33.9 g/dL (ref 32.0–36.0)
MCV: 90.4 fL (ref 80.0–100.0)
Platelets: 185 10*3/uL (ref 150–440)
RBC: 4.76 MIL/uL (ref 4.40–5.90)
RDW: 15.6 % — ABNORMAL HIGH (ref 11.5–14.5)
WBC: 5.8 10*3/uL (ref 3.8–10.6)

## 2017-08-08 MED ORDER — GUAIFENESIN 100 MG/5ML PO SOLN
5.0000 mL | ORAL | Status: DC | PRN
  Administered 2017-08-08: 100 mg via ORAL
  Filled 2017-08-08 (×3): qty 5

## 2017-08-08 MED ORDER — PANTOPRAZOLE SODIUM 40 MG PO TBEC: 40.0000 mg | DELAYED_RELEASE_TABLET | Freq: Every day | ORAL | 0 refills | Status: DC

## 2017-08-08 MED ORDER — CARVEDILOL 25 MG PO TABS: 12.5000 mg | ORAL_TABLET | Freq: Two times a day (BID) | ORAL | 0 refills | Status: DC

## 2017-08-08 MED ORDER — GLIPIZIDE ER 2.5 MG PO TB24: 2.5000 mg | ORAL_TABLET | Freq: Every day | ORAL | 0 refills | Status: DC

## 2017-08-08 NOTE — Progress Notes (Signed)
RN removed IV.  Patient will ride home with his son and wife and a private vehicle.  Education refreshed about fluid restriction.  Patient and wife understood discharge instructions.  Phillis Knack, RN

## 2017-08-08 NOTE — Discharge Summary (Addendum)
[place] at [place] NAME: [name]    MR#:  [id number]  DATE OF BIRTH:  1933-10-03  DATE OF ADMISSION:  08/07/2017   ADMITTING PHYSICIAN: Loletha Grayer, MD  DATE OF DISCHARGE: 08/08/17  PRIMARY CARE PHYSICIAN: Ronnell Freshwater, NP   ADMISSION DIAGNOSIS:  Chest pain, unspecified type [R07.9] DISCHARGE DIAGNOSIS:  Active Problems:   Chest pain  SECONDARY DIAGNOSIS:   Past Medical History:  Diagnosis Date  . Atrial fibrillation ([place])   . CHF (congestive heart failure) ([place])   . Diabetes ([place])   . Hearing loss   . High blood pressure   . History of bladder problems   . Kidney stones 11/22/2014   HOSPITAL COURSE:  [name] is an 84 year old male with a PMH of a-fib, CHF, diabetes, HTN presenting to the ED with non-exertional chest pain while eating breakfast. He was noted to be in a-fib with rvr on arrival to the ED. He was admitted for ACS rule out.  1.  Atrial fibrillation with rapid ventricular response.  - recently discharged from hospital on coreg instead of diltiazem. Was unable to pick up the coreg from the pharmacy. This has been reordered - heart rate greatly improved with coreg and was in the 80s on the day of discharge - recent TSH normal - pradaxa was initially held due AKI but was restarted on discharge  2.  Chest pain and upper abdominal pain.  Atypical chest pain. Describes the chest pain as burning in his chest and throat after eating. Likely GI in etiology. - troponins negative x 3 - seen by cardiology, who did not feel that he needed any additional inpatient work-up - already has follow-up appointment with cardiology on 08/11/17 - discharged on protonix daily  3.  Acute kidney injury on chronic kidney disease stage III.   - given fluid bolus and Cr improved from 2.60 to 2.38 (baseline Cr ~2) - lisinopril held during hospitalization and not restarted on discharge- recheck bmp and restart as outpatient -  lasix held during hospitalization but restarted on discharge  4.  History of systolic congestive heart failure.  - stable without any signs of acute exacerbation - continue lasix and coreg on discharge  5.  Type 2 diabetes mellitus.   - on SSI during hospitalization - glipizide changed from 10mg  bid to 2.5 mg ER daily due to renal insufficiency - follow-up blood sugars as an outpatient   DISCHARGE CONDITIONS:  Chest pain, likely GI etiology, resolved A-fib with RVR, now rate controlled AKI on CKD III, improved Systolic heart failure Type 2 diabetes CONSULTS OBTAINED:  Treatment Team:  Isaias Cowman, MD DRUG ALLERGIES:  No Known Allergies DISCHARGE MEDICATIONS:   Allergies as of 08/08/2017   No Known Allergies     Medication List    STOP taking these medications   glipiZIDE 10 MG tablet Commonly known as:  GLUCOTROL Replaced by:  glipiZIDE 2.5 MG 24 hr tablet   lisinopril 5 MG tablet Commonly known as:  PRINIVIL,ZESTRIL     TAKE these medications   atorvastatin 40 MG tablet Commonly known as:  LIPITOR Take 1 tablet (40 mg total) by mouth daily at 6 PM.   benzonatate 100 MG capsule Commonly known as:  TESSALON PERLES Take 1 capsule (100 mg total) by mouth every 6 (six) hours as needed for cough.   carvedilol 25 MG tablet Commonly known as:  COREG Take 0.5 tablets (12.5 mg total) by mouth  2 (two) times daily with a meal.   chlorpheniramine-HYDROcodone 10-8 MG/5ML Suer Commonly known as:  TUSSIONEX Take 5 mLs by mouth every 12 (twelve) hours as needed for cough.   furosemide 20 MG tablet Commonly known as:  LASIX Take 1 tablet (20 mg total) by mouth 2 (two) times daily.   glipiZIDE 2.5 MG 24 hr tablet Commonly known as:  GLUCOTROL XL Take 1 tablet (2.5 mg total) by mouth daily with breakfast. Replaces:  glipiZIDE 10 MG tablet   nitroGLYCERIN 0.4 MG SL tablet Commonly known as:  NITROSTAT Place 1 tablet (0.4 mg total) under the tongue every 5  (five) minutes as needed for chest pain.   ONETOUCH VERIO test strip Generic drug:  glucose blood USE UTD TO CHECK BLOOD SUGAR ONCE D   pantoprazole 40 MG tablet Commonly known as:  PROTONIX Take 1 tablet (40 mg total) by mouth daily.   PRADAXA 150 MG Caps capsule Generic drug:  dabigatran Take 150 mg by mouth 2 (two) times daily.        DISCHARGE INSTRUCTIONS:  1. F/u with PCP in 1-2 weeks 2. F/u with cardiology on 7/22 3. Patient was unable to pick up Coreg after last hospitalization. New rx for Coreg sent into pharmacy 4. Patient had AKI this admission- held Lisinopril on discharge. Please recheck bmp as outpatient and restart lisinopril if appropriate 5. Glipizide changed from 10mg  bid to 2.5mg  ER daily due to renal insufficiency. Please f/u blood sugars 6. Protonix started due to patient endorsing reflux symptoms. Please f/u with this. DIET:  Cardiac diet DISCHARGE CONDITION:  Stable ACTIVITY:  Activity as tolerated OXYGEN:  Home Oxygen: No.  Oxygen Delivery: room air DISCHARGE LOCATION:  home   If you experience worsening of your admission symptoms, develop shortness of breath, life threatening emergency, suicidal or homicidal thoughts you must seek medical attention immediately by calling 911 or calling your MD immediately  if symptoms less severe.  You Must read complete instructions/literature along with all the possible adverse reactions/side effects for all the Medicines you take and that have been prescribed to you. Take any new Medicines after you have completely understood and accpet all the possible adverse reactions/side effects.   Please note  You were cared for by a hospitalist during your hospital stay. If you have any questions about your discharge medications or the care you received while you were in the hospital after you are discharged, you can call the unit and asked to speak with the hospitalist on call if the hospitalist that took care of you is  not available. Once you are discharged, your primary care physician will handle any further medical issues. Please note that NO REFILLS for any discharge medications will be authorized once you are discharged, as it is imperative that you return to your primary care physician (or establish a relationship with a primary care physician if you do not have one) for your aftercare needs so that they can reassess your need for medications and monitor your lab values.    On the day of Discharge:  VITAL SIGNS:  Blood pressure 106/71, pulse 90, temperature 97.7 F (36.5 C), temperature source Oral, resp. rate 18, height 6' (1.829 m), weight 112.4 kg (247 lb 11.2 oz), SpO2 97 %. PHYSICAL EXAMINATION:  GENERAL:  84 y.o.-year-old patient lying in the bed with no acute distress.  EYES: Pupils equal, round, reactive to light and accommodation. No scleral icterus. Extraocular muscles intact.  HEENT: Head atraumatic, normocephalic. Oropharynx and  nasopharynx clear.  NECK:  Supple, no jugular venous distention. No thyroid enlargement, no tenderness.  LUNGS: Normal breath sounds bilaterally, no wheezing, rales,rhonchi or crepitation. No use of accessory muscles of respiration.  CARDIOVASCULAR: S1, S2 normal. No murmurs, rubs, or gallops.  ABDOMEN: Soft, non-tender, non-distended. Bowel sounds present. No organomegaly or mass.  EXTREMITIES: No pedal edema, cyanosis, or clubbing.  NEUROLOGIC: Cranial nerves II through XII are intact. Muscle strength 5/5 in all extremities. Sensation intact. Gait not checked.  PSYCHIATRIC: The patient is alert and oriented x 3.  SKIN: No obvious rash, lesion, or ulcer.  DATA REVIEW:   CBC Recent Labs  Lab 08/08/17 0539  WBC 5.8  HGB 14.6  HCT 43.0  PLT 185    Chemistries  Recent Labs  Lab 08/04/17 0404  08/07/17 0920 08/08/17 0539  NA 135   < > 136 140  K 4.2   < > 4.4 4.6  CL 101   < > 102 106  CO2 26   < > 28 28  GLUCOSE 130*   < > 317* 78  BUN 36*   < > 50*  46*  CREATININE 1.90*   < > 2.60* 2.38*  CALCIUM 8.8*   < > 8.6* 8.5*  MG 1.9  --   --   --   AST  --   --  27  --   ALT  --   --  24  --   ALKPHOS  --   --  97  --   BILITOT  --   --  0.5  --    < > = values in this interval not displayed.     Microbiology Results  Results for orders placed or performed in visit on 07/01/17  Microscopic Examination     Status: Abnormal   Collection Time: 07/01/17  2:39 PM  Result Value Ref Range Status   WBC, UA 0-5 0 - 5 /hpf Final   RBC, UA 3-10 (A) 0 - 2 /hpf Final   Epithelial Cells (non renal) 0-10 0 - 10 /hpf Final   Casts None seen None seen /lpf Final   Mucus, UA Present Not Estab. Final   Bacteria, UA None seen None seen/Few Final    RADIOLOGY:  Dg Chest Port 1 View  Result Date: 08/08/2017 CLINICAL DATA:  Cough. EXAM: PORTABLE CHEST 1 VIEW COMPARISON:  Single-view of the chest 08/07/2017. PA and lateral chest 08/06/2017 and 05/04/2016. CT chest 05/04/2016. FINDINGS: Mild elevation of the left hemidiaphragm is unchanged. Small focus of linear atelectasis or scar in the left base is noted. Heart size is upper normal. No pneumothorax or pleural effusion. No acute or focal bony abnormality. IMPRESSION: No acute disease. Electronically Signed   By: Inge Rise M.D.   On: 08/08/2017 08:25   Dg Chest Port 1 View  Result Date: 08/07/2017 CLINICAL DATA:  84 year old male with acute chest pain this morning after breakfast. Evaluated yesterday for abdominal pain. EXAM: PORTABLE CHEST 1 VIEW COMPARISON:  Chest radiograph 08/06/2017 and earlier. FINDINGS: Portable AP semi upright view at 0903 hours. Mildly lower lung volumes. Stable cardiomegaly and mediastinal contours. Overall stable ventilation with mild chronic scarring at the left lung base. No pneumothorax. No pneumoperitoneum identified. Visualized tracheal air column is within normal limits. Negative visible bowel gas pattern. IMPRESSION: Low lower lung volumes compared to yesterday,  otherwise no acute cardiopulmonary abnormality. Electronically Signed   By: Genevie Ann M.D.   On: 08/07/2017 09:51   Dg  Abd 2 Views  Result Date: 08/07/2017 CLINICAL DATA:  Generalized abdomen pain for 3 days EXAM: ABDOMEN - 2 VIEW COMPARISON:  CT abdomen and pelvis August 19, 2016 FINDINGS: The bowel gas pattern is normal. There is no evidence of free air. Marked bowel content is identified throughout colon. No radio-opaque calculi or other significant radiographic abnormality is seen. Surgical clips are identified in the lower pelvis. IMPRESSION: No bowel obstruction.  Constipation. Electronically Signed   By: Abelardo Diesel M.D.   On: 08/07/2017 15:24     Management plans discussed with the patient, family and they are in agreement.  CODE STATUS: Full Code   TOTAL TIME TAKING CARE OF THIS PATIENT: 35 minutes.    [name] M.D on 08/08/2017 at 8:58 AM  Between 7am to 6pm - Pager - [phone]  After 6pm go to [email or web address] - Proofreader  [facility]  Office  [phone]  CC: Primary care physician; Ronnell Freshwater, NP   Note: This dictation was prepared with Dragon dictation along with smaller phrase technology. Any transcriptional errors that result from this process are unintentional.

## 2017-08-08 NOTE — Discharge Instructions (Signed)
It was a pleasure meeting you!  You came to the hospital because you were having chest pain. We checked your troponins, which is something that your heart releases into the blood when it's being injured. Your troponins were negative. You were seen by cardiology, who did not think you needed any additional testing while you're here.   We have made some medication changes for you: 1. We have changed your Glipizide dose from 10mg  twice a day to 2.5mg  daily. This is a safer dose because of your kidneys. 2. We have STOPPED your Lisinopril for now because of your kidneys. Your cardiologist or primary care doctor will recheck your kidneys in clinic and may restart this medication in the future. 3. We have started a medication called Protonix to help with your acid reflux. Please take 1 tablet once a day.  If you have any more chest pain or shortness of breath, please come back to the hospital.

## 2017-08-08 NOTE — Care Management Obs Status (Signed)
[place] NOTIFICATION   Patient Details  Name: [name] MRN: [id number] Date of Birth: 13-Dec-1933   Medicare Observation Status Notification Given:  No(admitted obs less thand 24 hours)    Beverly Sessions, RN 08/08/2017, 10:17 AM

## 2017-08-08 NOTE — Progress Notes (Signed)
[facility] Cardiology  SUBJECTIVE: The patient denies recurrent chest pain, shortness of breath, or palpitations.   Vitals:   08/07/17 1255 08/07/17 1944 08/08/17 0327 08/08/17 0744  BP:  105/78 133/69 106/71  Pulse:  95 (!) 58 90  Resp:  18 18   Temp:  98.3 F (36.8 C) 98.4 F (36.9 C) 97.7 F (36.5 C)  TempSrc:  Oral Oral Oral  SpO2:  97% 97% 97%  Weight: 112.7 kg (248 lb 8 oz)  112.4 kg (247 lb 11.2 oz)   Height: 6' (1.829 m)        Intake/Output Summary (Last 24 hours) at 08/08/2017 0839 Last data filed at 08/08/2017 0440 Gross per 24 hour  Intake 100 ml  Output 850 ml  Net -750 ml      PHYSICAL EXAM  General: Well developed, well nourished, in no acute distress. Sitting upright in bed resting after eating breakfast. HEENT:  Normocephalic and atramatic Neck:  No JVD.  Lungs: Normal effort of breathing on room air, bibasilar crackles Heart: Irregular irregular . Normal S1 and S2 without gallops or murmurs.  Abdomen: abdomen nondistended Msk:  Back normal, able to sit upright in bed. Normal strength and tone for age. Extremities: No clubbing, cyanosis or edema.   Neuro: Alert and oriented X 3. Psych:  Good affect, responds appropriately   LABS: Basic Metabolic Panel: Recent Labs    08/07/17 0920 08/08/17 0539  NA 136 140  K 4.4 4.6  CL 102 106  CO2 28 28  GLUCOSE 317* 78  BUN 50* 46*  CREATININE 2.60* 2.38*  CALCIUM 8.6* 8.5*   Liver Function Tests: Recent Labs    08/07/17 0920  AST 27  ALT 24  ALKPHOS 97  BILITOT 0.5  PROT 7.2  ALBUMIN 3.3*   Recent Labs    08/07/17 1650  LIPASE 46   CBC: Recent Labs    08/07/17 0920 08/08/17 0539  WBC 6.6 5.8  HGB 14.7 14.6  HCT 45.1 43.0  MCV 92.1 90.4  PLT 201 185   Cardiac Enzymes: Recent Labs    08/07/17 0920 08/07/17 1329 08/07/17 1650  TROPONINI 0.03* 0.03* 0.03*   BNP: Invalid input(s): POCBNP D-Dimer: No results for input(s): DDIMER in the last 72 hours. Hemoglobin A1C: No results  for input(s): HGBA1C in the last 72 hours. Fasting Lipid Panel: No results for input(s): CHOL, HDL, LDLCALC, TRIG, CHOLHDL, LDLDIRECT in the last 72 hours. Thyroid Function Tests: No results for input(s): TSH, T4TOTAL, T3FREE, THYROIDAB in the last 72 hours.  Invalid input(s): FREET3 Anemia Panel: No results for input(s): VITAMINB12, FOLATE, FERRITIN, TIBC, IRON, RETICCTPCT in the last 72 hours.  Dg Chest 2 View  Result Date: 08/06/2017 CLINICAL DATA:  Chest pain and shortness of breath. EXAM: CHEST - 2 VIEW COMPARISON:  Chest x-ray dated August 01, 2017. FINDINGS: Stable cardiomegaly. Normal pulmonary vascularity. Bibasilar atelectasis. Unchanged elevation of the left hemidiaphragm. No focal consolidation, pleural effusion, or pneumothorax. No acute osseous abnormality. IMPRESSION: No active cardiopulmonary disease. Electronically Signed   By: Titus Dubin M.D.   On: 08/06/2017 11:34   Dg Chest Port 1 View  Result Date: 08/08/2017 CLINICAL DATA:  Cough. EXAM: PORTABLE CHEST 1 VIEW COMPARISON:  Single-view of the chest 08/07/2017. PA and lateral chest 08/06/2017 and 05/04/2016. CT chest 05/04/2016. FINDINGS: Mild elevation of the left hemidiaphragm is unchanged. Small focus of linear atelectasis or scar in the left base is noted. Heart size is upper normal. No pneumothorax or pleural effusion. No  acute or focal bony abnormality. IMPRESSION: No acute disease. Electronically Signed   By: Inge Rise M.D.   On: 08/08/2017 08:25   Dg Chest Port 1 View  Result Date: 08/07/2017 CLINICAL DATA:  84 year old male with acute chest pain this morning after breakfast. Evaluated yesterday for abdominal pain. EXAM: PORTABLE CHEST 1 VIEW COMPARISON:  Chest radiograph 08/06/2017 and earlier. FINDINGS: Portable AP semi upright view at 0903 hours. Mildly lower lung volumes. Stable cardiomegaly and mediastinal contours. Overall stable ventilation with mild chronic scarring at the left lung base. No  pneumothorax. No pneumoperitoneum identified. Visualized tracheal air column is within normal limits. Negative visible bowel gas pattern. IMPRESSION: Low lower lung volumes compared to yesterday, otherwise no acute cardiopulmonary abnormality. Electronically Signed   By: Genevie Ann M.D.   On: 08/07/2017 09:51   Dg Abd 2 Views  Result Date: 08/07/2017 CLINICAL DATA:  Generalized abdomen pain for 3 days EXAM: ABDOMEN - 2 VIEW COMPARISON:  CT abdomen and pelvis August 19, 2016 FINDINGS: The bowel gas pattern is normal. There is no evidence of free air. Marked bowel content is identified throughout colon. No radio-opaque calculi or other significant radiographic abnormality is seen. Surgical clips are identified in the lower pelvis. IMPRESSION: No bowel obstruction.  Constipation. Electronically Signed   By: Abelardo Diesel M.D.   On: 08/07/2017 15:24     Echo 35-40%  TELEMETRY: Atrial fibrillation, 86-92 bpm  ASSESSMENT AND PLAN:  Active Problems:   Chest pain   1. Atypical chest pain, without recurrence, with flat troponin of 0.03 x 3, without acute ST-T wave abnormalities 2. Atrial fibrillation with RVR, rate improved on oral carvedilol 3. Acute kidney injury on chronic kidney disease stage III, Lasix held 4. Chronic systolic CHF, crackles on exam. Repeat chest xray ordered for this morning 5. Hypertension, blood pressure soft  Plan: 1. Resume Pradaxa for stroke prevention with CrCl 31 mL/min now  2. Continue carvedilol  3. Pending results of chest xray, may need dose of Lasix 4. Per cardiology standpoint, okay to discharge with follow-up as scheduled with Dr. [name] as outpatient on this coming Monday     Clabe Seal, PA-C 08/08/2017 8:39 AM

## 2017-08-09 ENCOUNTER — Encounter: Payer: Self-pay | Admitting: Emergency Medicine

## 2017-08-09 ENCOUNTER — Emergency Department: Payer: Medicare Other

## 2017-08-09 ENCOUNTER — Emergency Department
Admission: EM | Admit: 2017-08-09 | Discharge: 2017-08-09 | Disposition: A | Discharge status: Home / Self Care | Payer: Medicare Other | Attending: Emergency Medicine | Admitting: Emergency Medicine

## 2017-08-09 DIAGNOSIS — E1165 Type 2 diabetes mellitus with hyperglycemia: Secondary | ICD-10-CM | POA: Diagnosis not present

## 2017-08-09 DIAGNOSIS — I509 Heart failure, unspecified: Secondary | ICD-10-CM | POA: Insufficient documentation

## 2017-08-09 DIAGNOSIS — I4891 Unspecified atrial fibrillation: Secondary | ICD-10-CM | POA: Diagnosis not present

## 2017-08-09 DIAGNOSIS — Z79899 Other long term (current) drug therapy: Secondary | ICD-10-CM | POA: Diagnosis not present

## 2017-08-09 DIAGNOSIS — R519 Headache, unspecified: Secondary | ICD-10-CM

## 2017-08-09 DIAGNOSIS — N189 Chronic kidney disease, unspecified: Secondary | ICD-10-CM | POA: Insufficient documentation

## 2017-08-09 DIAGNOSIS — Z87891 Personal history of nicotine dependence: Secondary | ICD-10-CM | POA: Insufficient documentation

## 2017-08-09 DIAGNOSIS — R51 Headache: Secondary | ICD-10-CM | POA: Diagnosis not present

## 2017-08-09 DIAGNOSIS — I130 Hypertensive heart and chronic kidney disease with heart failure and stage 1 through stage 4 chronic kidney disease, or unspecified chronic kidney disease: Secondary | ICD-10-CM | POA: Diagnosis not present

## 2017-08-09 DIAGNOSIS — E1122 Type 2 diabetes mellitus with diabetic chronic kidney disease: Secondary | ICD-10-CM | POA: Diagnosis not present

## 2017-08-09 LAB — COMPREHENSIVE METABOLIC PANEL
ALT: 26 U/L (ref 0–44)
AST: 25 U/L (ref 15–41)
Albumin: 3.6 g/dL (ref 3.5–5.0)
Alkaline Phosphatase: 93 U/L (ref 38–126)
Anion gap: 8 (ref 5–15)
BUN: 46 mg/dL — ABNORMAL HIGH (ref 8–23)
CO2: 26 mmol/L (ref 22–32)
Calcium: 8.9 mg/dL (ref 8.9–10.3)
Chloride: 104 mmol/L (ref 98–111)
Creatinine, Ser: 2.2 mg/dL — ABNORMAL HIGH (ref 0.61–1.24)
GFR calc Af Amer: 30 mL/min — ABNORMAL LOW (ref >60)
GFR calc non Af Amer: 26 mL/min — ABNORMAL LOW (ref >60)
Glucose, Bld: 142 mg/dL — ABNORMAL HIGH (ref 70–99)
Potassium: 5 mmol/L (ref 3.5–5.1)
Sodium: 138 mmol/L (ref 135–145)
Total Bilirubin: 0.5 mg/dL (ref 0.3–1.2)
Total Protein: 7.6 g/dL (ref 6.5–8.1)

## 2017-08-09 LAB — CBC WITH DIFFERENTIAL/PLATELET
Basophils Absolute: 0 10*3/uL (ref 0–0.1)
Basophils Relative: 1 %
Eosinophils Absolute: 0.2 10*3/uL (ref 0–0.7)
Eosinophils Relative: 2 %
HCT: 46.8 % (ref 40.0–52.0)
Hemoglobin: 15.3 g/dL (ref 13.0–18.0)
Lymphocytes Relative: 24 %
Lymphs Abs: 1.8 10*3/uL (ref 1.0–3.6)
MCH: 29.9 pg (ref 26.0–34.0)
MCHC: 32.7 g/dL (ref 32.0–36.0)
MCV: 91.4 fL (ref 80.0–100.0)
Monocytes Absolute: 0.8 10*3/uL (ref 0.2–1.0)
Monocytes Relative: 11 %
Neutro Abs: 4.5 10*3/uL (ref 1.4–6.5)
Neutrophils Relative %: 62 %
Platelets: 196 10*3/uL (ref 150–440)
RBC: 5.12 MIL/uL (ref 4.40–5.90)
RDW: 15.7 % — ABNORMAL HIGH (ref 11.5–14.5)
WBC: 7.3 10*3/uL (ref 3.8–10.6)

## 2017-08-09 LAB — APTT: aPTT: 59 seconds — ABNORMAL HIGH (ref 24–36)

## 2017-08-09 LAB — PROTIME-INR
INR: 1.94
Prothrombin Time: 22 seconds — ABNORMAL HIGH (ref 11.4–15.2)

## 2017-08-09 MED ORDER — FENTANYL CITRATE (PF) 100 MCG/2ML IJ SOLN
50.0000 ug | Freq: Once | INTRAMUSCULAR | Status: AC
  Administered 2017-08-09: 50 ug via INTRAVENOUS

## 2017-08-09 MED ORDER — FENTANYL CITRATE (PF) 100 MCG/2ML IJ SOLN
INTRAMUSCULAR | Status: AC
  Administered 2017-08-09: 50 ug via INTRAVENOUS
  Filled 2017-08-09: qty 2

## 2017-08-09 MED ORDER — SODIUM CHLORIDE 0.9 % IV BOLUS
250.0000 mL | Freq: Once | INTRAVENOUS | Status: AC
  Administered 2017-08-09: 250 mL via INTRAVENOUS

## 2017-08-09 NOTE — ED Triage Notes (Addendum)
Pt arrived via ems from home with complaints of persistent headache that started this morning. Pt states he does gets headaches "but never this bad." Pt recently seen and discharged from hospital with CHF and was newly prescribed lasix. Pt states he has very strict fluid intact guidelines. Pt denies any other ailments and it a& o x 4. Speech clear. Pt took 2 tylenol prior to arrival to hospital.

## 2017-08-09 NOTE — Discharge Instructions (Addendum)
Drink fluids, if the headache returns, take Tylenol, if you have significant headache, numbness, weakness or other complaints or concerns please return to the emergency department.  Follow close with your doctor without fail tomorrow the next day.  Is unclear exactly what the cause of your headache today was it could have been dehydration, do not drink too much given your heart failure but please stay up on your fluids.

## 2017-08-09 NOTE — ED Provider Notes (Addendum)
[facility] Emergency Department Provider Note  ____________________________________________   I have reviewed the triage vital signs and the nursing notes. Where available I have reviewed prior notes and, if possible and indicated, outside hospital notes.    HISTORY  Chief Complaint Headache    HPI [name] is a 84 y.o. male history of atrial fibrillation, CHF diabetes hearing loss blood pressure kidney stones, states he gets headaches when he gets dehydrated, has a headache today.  Gradual onset this morning.  Does not think is the worst headache of life.  Took Tylenol with minimal relief.  Family thinks that he has been dehydrated.  Patient has had multiple different visits to the hospital in the last week or so, dyspnea atrial fibrillation and nonspecific chest pain, he is on Lasix and he is on fluid restriction is been very hot out and he feels that he is been getting dehydrated.  Patient denies any numbness or weakness with his headache, is on the right side gradual onset throbbing, no visual changes, no eye pain, no trauma, no neck pain, no stiff neck, no fever.     Past Medical History:  Diagnosis Date  . Atrial fibrillation ([place])   . CHF (congestive heart failure) ([place])   . Diabetes ([place])   . Hearing loss   . High blood pressure   . History of bladder problems   . Kidney stones 11/22/2014    Patient Active Problem List   Diagnosis Date Noted  . Atrial fibrillation with RVR ([place]) 08/02/2017  . Acute CHF (congestive heart failure) ([place]) 08/02/2017  . Prostatic hypertrophy 07/20/2017  . Dysuria 07/20/2017  . Acute gout of left ankle 04/10/2017  . Hyperpigmentation 04/10/2017  . Uncontrolled type 2 diabetes mellitus with hyperglycemia ([place]) 03/24/2017  . Encounter for general adult medical examination with abnormal findings 03/24/2017  . Chest pain 05/04/2016  . Elevated troponin 05/04/2016  . Essential hypertension 07/06/2014  . Cough  03/21/2014  . OSA (obstructive sleep apnea) 03/21/2014  . Paroxysmal A-fib ([place]) 03/21/2014  . Hx of adenomatous colonic polyps 06/28/2013  . Liver cyst 06/28/2013  . Chronic kidney disease 06/08/2013  . Diabetes ([place]) 06/08/2013  . Hyperlipidemia, mixed 06/08/2013  . Valvular heart disease 06/08/2013    Past Surgical History:  Procedure Laterality Date  . CATARACT EXTRACTION    . PROSTATE CANCER    . PROSTATE SURGERY      Prior to Admission medications   Medication Sig Start Date End Date Taking? Authorizing Provider  atorvastatin (LIPITOR) 40 MG tablet Take 1 tablet (40 mg total) by mouth daily at 6 PM. 08/04/17   Demetrios Loll, MD  benzonatate (TESSALON PERLES) 100 MG capsule Take 1 capsule (100 mg total) by mouth every 6 (six) hours as needed for cough. 08/06/17 08/06/18  Merlyn Lot, MD  carvedilol (COREG) 25 MG tablet Take 0.5 tablets (12.5 mg total) by mouth 2 (two) times daily with a meal. 08/08/17   Mayo, Pete Pelt, MD  chlorpheniramine-HYDROcodone (TUSSIONEX) 10-8 MG/5ML SUER Take 5 mLs by mouth every 12 (twelve) hours as needed for cough. 08/04/17   Demetrios Loll, MD  furosemide (LASIX) 20 MG tablet Take 1 tablet (20 mg total) by mouth 2 (two) times daily. 08/04/17   Demetrios Loll, MD  glipiZIDE (GLUCOTROL XL) 2.5 MG 24 hr tablet Take 1 tablet (2.5 mg total) by mouth daily with breakfast. 08/08/17   Mayo, Pete Pelt, MD  nitroGLYCERIN (NITROSTAT) 0.4 MG SL tablet Place 1 tablet (0.4 mg  total) under the tongue every 5 (five) minutes as needed for chest pain. 05/05/16   Theodoro Grist, MD  ONETOUCH VERIO test strip USE UTD TO CHECK BLOOD SUGAR ONCE D 02/03/17   Ronnell Freshwater, NP  pantoprazole (PROTONIX) 40 MG tablet Take 1 tablet (40 mg total) by mouth daily. 08/08/17   Mayo, Pete Pelt, MD  PRADAXA 150 MG CAPS capsule Take 150 mg by mouth 2 (two) times daily. 04/05/16   [provider]    Allergies Patient has no known allergies.  Family History  Problem Relation Age of  Onset  . Colon cancer Mother   . Diabetes Unknown   . High blood pressure Unknown   . Prostate cancer Brother   . Diabetes Brother     Social History Social History   Tobacco Use  . Smoking status: Former Smoker    Years: 16.00    Types: Cigarettes  . Smokeless tobacco: Never Used  Substance Use Topics  . Alcohol use: No  . Drug use: No    Review of Systems Constitutional: No fever/chills Eyes: No visual changes. ENT: No sore throat. No stiff neck no neck pain Cardiovascular: Denies chest pain. Respiratory: Denies shortness of breath. Gastrointestinal:   no vomiting.  No diarrhea.  No constipation. Genitourinary: Negative for dysuria. Musculoskeletal: Negative lower extremity swelling Skin: Negative for rash. Neurological: Positive for headache focal weakness or numbness.   ____________________________________________   PHYSICAL EXAM:  VITAL SIGNS: ED Triage Vitals  Enc Vitals Group     BP 08/09/17 1144 127/89     Pulse Rate 08/09/17 1144 (!) 103     Resp 08/09/17 1144 19     Temp 08/09/17 1144 98.3 F (36.8 C)     Temp Source 08/09/17 1144 Oral     SpO2 08/09/17 1144 98 %     Weight 08/09/17 1145 247 lb (112 kg)     Height 08/09/17 1145 6' (1.829 m)     Head Circumference --      Peak Flow --      Pain Score 08/09/17 1144 6     Pain Loc --      Pain Edu? --      Excl. in [place]? --     Constitutional: Alert and oriented. Well appearing and in no acute distress. Eyes: Conjunctivae are normal Head: Atraumatic HEENT: No congestion/rhinnorhea. Mucous membranes are dry.  Oropharynx non-erythematous Neck:   Nontender with no meningismus, no masses, no stridor Cardiovascular: Normal rate, irregularly irregular rhythm. Grossly normal heart sounds.  Good peripheral circulation. Respiratory: Normal respiratory effort.  No retractions. Lungs CTAB. Abdominal: Soft and nontender. No distention. No guarding no rebound Back:  There is no focal tenderness or step off.   there is no midline tenderness there are no lesions noted. there is no CVA tenderness Musculoskeletal: No lower extremity tenderness, no upper extremity tenderness. No joint effusions, no DVT signs strong distal pulses no edema Neurologic:  Normal speech and language. No gross focal neurologic deficits are appreciated.  Skin:  Skin is warm, dry and intact. No rash noted. Psychiatric: Mood and affect are normal. Speech and behavior are normal.  ____________________________________________   LABS (all labs ordered are listed, but only abnormal results are displayed)  Labs Reviewed  COMPREHENSIVE METABOLIC PANEL - Abnormal; Notable for the following components:      Result Value   Glucose, Bld 142 (*)    BUN 46 (*)    Creatinine, Ser 2.20 (*)  GFR calc non Af Amer 26 (*)    GFR calc Af Amer 30 (*)    All other components within normal limits  PROTIME-INR - Abnormal; Notable for the following components:   Prothrombin Time 22.0 (*)    All other components within normal limits  APTT - Abnormal; Notable for the following components:   aPTT 59 (*)    All other components within normal limits  CBC WITH DIFFERENTIAL/PLATELET - Abnormal; Notable for the following components:   RDW 15.7 (*)    All other components within normal limits    Pertinent labs  results that were available during my care of the patient were reviewed by me and considered in my medical decision making (see chart for details). ____________________________________________  EKG  I personally interpreted any EKGs ordered by me or triage Atrial fibrillation, PVCs noted, no acute ST elevation or depression, LAD noted. ____________________________________________  RADIOLOGY  Pertinent labs & imaging results that were available during my care of the patient were reviewed by me and considered in my medical decision making (see chart for details). If possible, patient and/or family made aware of any abnormal  findings.  Ct Head Wo Contrast  Result Date: 08/09/2017 CLINICAL DATA:  Persistent headache that started this morning. EXAM: CT HEAD WITHOUT CONTRAST TECHNIQUE: Contiguous axial images were obtained from the base of the skull through the vertex without intravenous contrast. COMPARISON:  01/21/2015. FINDINGS: Brain: Diffusely enlarged ventricles and subarachnoid spaces. Patchy white matter low density in both cerebral hemispheres. No intracranial hemorrhage, mass lesion or CT evidence of acute infarction. Vascular: No hyperdense vessel or unexpected calcification. Skull: Normal. Negative for fracture or focal lesion. Sinuses/Orbits: Status post bilateral cataract extraction. Unremarkable included paranasal sinuses. Other: None. IMPRESSION: 1. No acute abnormality. 2. Stable mild to moderate diffuse cerebral and cerebellar atrophy. 3. Stable minimal chronic small vessel white matter ischemic changes in both cerebral hemispheres. Electronically Signed   By: Claudie Revering M.D.   On: 08/09/2017 12:48   ____________________________________________    PROCEDURES  Procedure(s) performed: None  Procedures  Critical Care performed: None  ____________________________________________   INITIAL IMPRESSION / ASSESSMENT AND PLAN / ED COURSE  Pertinent labs & imaging results that were available during my care of the patient were reviewed by me and considered in my medical decision making (see chart for details).  Patient here with a headache neurologically intact no evidence of meningitis, nonetheless I did do a CT scan, CT scan is reassuringly negative and no evidence of bleed.  Do not think he has meningitis.  Cannot do LP because of patient Pradaxa.  Multiple different things can cause a headache patient feels it is because he is dehydrated, we will give him some IV fluid, I did give him pain medication initially he is completely headache free and remains neurologically intact.  There are no red flags at  this time to suggest that the patient has an aneurysmal event, and CT scan ordered within a few hours of onset of symptoms shows no evidence of bleeding especially on Pradaxa I think this is a very significant negative finding.  Again cannot perform LP.  I do not think CTA is indicated he is neurologically intact and states he does get headaches when he feels dehydrated.  We will give him a little bolus of IV fluid as he denies very significant fluid restriction and very hot weather on diuretic medications.  Otherwise he is feeling in no distress at this time he is completely symptomatic  and we will decide what else needs to happen for him after fluids.  ----------------------------------------- 2:13 PM on 08/09/2017 -----------------------------------------  Patient with no headache laughing and joking in the room after fluids, I discussed admission and further work-up and he declines.  He wants to go home.  Pt remains at neurologic baseline. At this time, there is nothing to suggest or support the diagnosis of subarachnoid hemorrhage, aneurysmal event, meningitis, tumor or mass, cavernous thrombosis, encephalitis, ischemic stroke, pseudotumor cerebri, glaucoma, temporal arteritis, or any other acute intracrania/neurological process. Extensive return precautions including but not limited to any new or worrisome symptoms such as worsening of, or change in, headache, any neurological symptoms, fever etc.. Natural disease course discussed with patient. The need for follow-up and all of my customary return precautions have been discussed as well.  Patient and family will follow closely with PCP tomorrow.  Blood work is at or near baseline, remains at his neurologic baseline with no complaints.   ____________________________________________   FINAL CLINICAL IMPRESSION(S) / ED DIAGNOSES  Final diagnoses:  None      This chart was dictated using voice recognition software.  Despite best efforts to  proofread,  errors can occur which can change meaning.      Schuyler Amor, MD 08/09/17 1319    Schuyler Amor, MD 08/09/17 [phone]

## 2017-08-11 DIAGNOSIS — I10 Essential (primary) hypertension: Secondary | ICD-10-CM | POA: Diagnosis not present

## 2017-08-11 DIAGNOSIS — I5021 Acute systolic (congestive) heart failure: Secondary | ICD-10-CM | POA: Diagnosis not present

## 2017-08-11 DIAGNOSIS — R600 Localized edema: Secondary | ICD-10-CM | POA: Diagnosis not present

## 2017-08-11 DIAGNOSIS — I482 Chronic atrial fibrillation: Secondary | ICD-10-CM | POA: Diagnosis not present

## 2017-08-12 ENCOUNTER — Ambulatory Visit: Payer: Self-pay | Admitting: Nurse Practitioner

## 2017-08-15 ENCOUNTER — Encounter: Payer: Self-pay | Admitting: Adult Health

## 2017-08-15 ENCOUNTER — Ambulatory Visit: Payer: Medicare Other | Admitting: Adult Health

## 2017-08-15 VITALS — BP 79/60 | HR 36 | Resp 16 | Ht 72.0 in | Wt 246.0 lb

## 2017-08-15 DIAGNOSIS — E782 Mixed hyperlipidemia: Secondary | ICD-10-CM

## 2017-08-15 DIAGNOSIS — I480 Paroxysmal atrial fibrillation: Secondary | ICD-10-CM | POA: Diagnosis not present

## 2017-08-15 DIAGNOSIS — I10 Essential (primary) hypertension: Secondary | ICD-10-CM | POA: Diagnosis not present

## 2017-08-15 DIAGNOSIS — E1165 Type 2 diabetes mellitus with hyperglycemia: Secondary | ICD-10-CM | POA: Diagnosis not present

## 2017-08-15 NOTE — Progress Notes (Signed)
[facility] [place], [place] [address]  Internal MEDICINE  Office Visit Note  Patient Name: [name]  [id number]  [id number]  Date of Service: 08/15/2017  Chief Complaint  Patient presents with  . Headache    hospital follow up  . Quality Metric Gaps    pnuemovax    HPI  Pt here for follow up appointment. He has a history of A-fib, CHF, HTN, DM, Headaches. He was in the emergency room 6 days ago for a headache. Wife at bedside reports the pt has had a good couple days, but has been intermittently dizzy and weak since Dr. [name] changed his BP medicine. They are both poor historians and did not bring his current medications to visit.        Current Medication: Outpatient Encounter Medications as of 08/15/2017  Medication Sig  . atorvastatin (LIPITOR) 40 MG tablet Take 1 tablet (40 mg total) by mouth daily at 6 PM.  . benzonatate (TESSALON PERLES) 100 MG capsule Take 1 capsule (100 mg total) by mouth every 6 (six) hours as needed for cough.  . carvedilol (COREG) 25 MG tablet Take 0.5 tablets (12.5 mg total) by mouth 2 (two) times daily with a meal.  . chlorpheniramine-HYDROcodone (TUSSIONEX) 10-8 MG/5ML SUER Take 5 mLs by mouth every 12 (twelve) hours as needed for cough.  . furosemide (LASIX) 20 MG tablet Take 1 tablet (20 mg total) by mouth 2 (two) times daily.  Marland Kitchen glipiZIDE (GLUCOTROL XL) 2.5 MG 24 hr tablet Take 1 tablet (2.5 mg total) by mouth daily with breakfast.  . nitroGLYCERIN (NITROSTAT) 0.4 MG SL tablet Place 1 tablet (0.4 mg total) under the tongue every 5 (five) minutes as needed for chest pain.  Glory Rosebush VERIO test strip USE UTD TO CHECK BLOOD SUGAR ONCE D  . pantoprazole (PROTONIX) 40 MG tablet Take 1 tablet (40 mg total) by mouth daily.  Marland Kitchen PRADAXA 150 MG CAPS capsule Take 150 mg by mouth 2 (two) times daily.   No facility-administered encounter medications on file as of 08/15/2017.     Surgical History: Past Surgical History:   Procedure Laterality Date  . CATARACT EXTRACTION    . PROSTATE CANCER    . PROSTATE SURGERY      Medical History: Past Medical History:  Diagnosis Date  . Atrial fibrillation ([place])   . CHF (congestive heart failure) ([place])   . Diabetes ([place])   . Hearing loss   . High blood pressure   . History of bladder problems   . Kidney stones 11/22/2014    Family History: Family History  Problem Relation Age of Onset  . Colon cancer Mother   . Diabetes Unknown   . High blood pressure Unknown   . Prostate cancer Brother   . Diabetes Brother     Social History   Socioeconomic History  . Marital status: Married    Spouse name: Not on file  . Number of children: Not on file  . Years of education: Not on file  . Highest education level: Not on file  Occupational History  . Not on file  Social Needs  . Financial resource strain: Not on file  . Food insecurity:    Worry: Not on file    Inability: Not on file  . Transportation needs:    Medical: Not on file    Non-medical: Not on file  Tobacco Use  . Smoking status: Former Smoker    Years: 16.00  Types: Cigarettes  . Smokeless tobacco: Never Used  Substance and Sexual Activity  . Alcohol use: No  . Drug use: No  . Sexual activity: Not on file  Lifestyle  . Physical activity:    Days per week: Not on file    Minutes per session: Not on file  . Stress: Not on file  Relationships  . Social connections:    Talks on phone: Not on file    Gets together: Not on file    Attends religious service: Not on file    Active member of club or organization: Not on file    Attends meetings of clubs or organizations: Not on file    Relationship status: Not on file  . Intimate partner violence:    Fear of current or ex partner: Not on file    Emotionally abused: Not on file    Physically abused: Not on file    Forced sexual activity: Not on file  Other Topics Concern  . Not on file  Social History Narrative  . Not on file       Review of Systems  Constitutional: Negative.  Negative for chills, fatigue and unexpected weight change.  HENT: Negative.  Negative for congestion, rhinorrhea, sneezing and sore throat.   Eyes: Negative for redness.  Respiratory: Negative.  Negative for cough, chest tightness and shortness of breath.   Cardiovascular: Negative.  Negative for chest pain and palpitations.  Gastrointestinal: Negative.  Negative for abdominal pain, constipation, diarrhea, nausea and vomiting.  Endocrine: Negative.   Genitourinary: Negative.  Negative for dysuria and frequency.  Musculoskeletal: Negative.  Negative for arthralgias, back pain, joint swelling and neck pain.  Skin: Negative.  Negative for rash.  Allergic/Immunologic: Negative.   Neurological: Negative.  Negative for tremors and numbness.  Hematological: Negative for adenopathy. Does not bruise/bleed easily.  Psychiatric/Behavioral: Negative.  Negative for behavioral problems, sleep disturbance and suicidal ideas. The patient is not nervous/anxious.     Vital Signs: BP (!) 79/60 (BP Location: Right Arm, Patient Position: Sitting, Cuff Size: Large)   Pulse (!) 36   Resp 16   Ht 6' (1.829 m)   Wt 246 lb (111.6 kg)   SpO2 95%   BMI 33.36 kg/m    Physical Exam  Constitutional: He is oriented to person, place, and time. He appears well-developed and well-nourished. No distress.  HENT:  Head: Normocephalic and atraumatic.  Mouth/Throat: Oropharynx is clear and moist. No oropharyngeal exudate.  Eyes: Pupils are equal, round, and reactive to light. EOM are normal.  Neck: Normal range of motion. Neck supple. No JVD present. No tracheal deviation present. No thyromegaly present.  Cardiovascular: Normal rate, regular rhythm and normal heart sounds. Exam reveals no gallop and no friction rub.  No murmur heard. Pulmonary/Chest: Effort normal and breath sounds normal. No respiratory distress. He has no wheezes. He has no rales. He exhibits no  tenderness.  Abdominal: Soft. There is no tenderness. There is no guarding.  Musculoskeletal: Normal range of motion.  Lymphadenopathy:    He has no cervical adenopathy.  Neurological: He is alert and oriented to person, place, and time. No cranial nerve deficit.  Skin: Skin is warm and dry. He is not diaphoretic.  Psychiatric: He has a normal mood and affect. His behavior is normal. Judgment and thought content normal.  Nursing note and vitals reviewed.  Assessment/Plan: 1. Essential hypertension Pt hypotensive at this visit.  Dr. [name] has changed his medications a few times in the last  2 weeks so they are unsure as to what he is taking now. They will follow up with him in the next two weeks, and bring all current medications to next visit for med reconciliation.   2. Uncontrolled type 2 diabetes mellitus with hyperglycemia (HCC) Controlled, FU scheduled with Heather in one month.   3. Paroxysmal A-fib (HCC) Controlled, Continue to see cardiology.   4. Hyperlipidemia, mixed Continue current therapy.   General Counseling: roshun klingensmith understanding of the findings of todays visit and agrees with plan of treatment. I have discussed any further diagnostic evaluation that may be needed or ordered today. We also reviewed his medications today. he has been encouraged to call the office with any questions or concerns that should arise related to todays visit.    No orders of the defined types were placed in this encounter.   No orders of the defined types were placed in this encounter.   Time spent: 25 Minutes   This patient was seen by Orson Gear AGNP-C in Collaboration with Dr Lavera Guise as a part of collaborative care agreement    Dr Lavera Guise Internal medicine

## 2017-08-18 ENCOUNTER — Ambulatory Visit: Payer: Medicare Other | Admitting: Podiatry

## 2017-08-18 DIAGNOSIS — E119 Type 2 diabetes mellitus without complications: Secondary | ICD-10-CM | POA: Diagnosis not present

## 2017-08-19 ENCOUNTER — Encounter: Payer: Self-pay | Admitting: Family

## 2017-08-19 ENCOUNTER — Ambulatory Visit: Payer: Medicare Other | Attending: Family | Admitting: Family

## 2017-08-19 ENCOUNTER — Telehealth: Payer: Self-pay | Admitting: Family

## 2017-08-19 ENCOUNTER — Other Ambulatory Visit: Payer: Self-pay | Admitting: Family

## 2017-08-19 VITALS — BP 102/84 | HR 86 | Resp 18 | Ht 72.0 in | Wt 244.0 lb

## 2017-08-19 DIAGNOSIS — Z87442 Personal history of urinary calculi: Secondary | ICD-10-CM | POA: Insufficient documentation

## 2017-08-19 DIAGNOSIS — Z833 Family history of diabetes mellitus: Secondary | ICD-10-CM | POA: Insufficient documentation

## 2017-08-19 DIAGNOSIS — Z79899 Other long term (current) drug therapy: Secondary | ICD-10-CM | POA: Insufficient documentation

## 2017-08-19 DIAGNOSIS — I5022 Chronic systolic (congestive) heart failure: Secondary | ICD-10-CM

## 2017-08-19 DIAGNOSIS — N189 Chronic kidney disease, unspecified: Secondary | ICD-10-CM | POA: Diagnosis not present

## 2017-08-19 DIAGNOSIS — Z8546 Personal history of malignant neoplasm of prostate: Secondary | ICD-10-CM | POA: Diagnosis not present

## 2017-08-19 DIAGNOSIS — Z7984 Long term (current) use of oral hypoglycemic drugs: Secondary | ICD-10-CM | POA: Insufficient documentation

## 2017-08-19 DIAGNOSIS — G4733 Obstructive sleep apnea (adult) (pediatric): Secondary | ICD-10-CM | POA: Diagnosis not present

## 2017-08-19 DIAGNOSIS — N183 Chronic kidney disease, stage 3 unspecified: Secondary | ICD-10-CM

## 2017-08-19 DIAGNOSIS — I10 Essential (primary) hypertension: Secondary | ICD-10-CM

## 2017-08-19 DIAGNOSIS — Z87891 Personal history of nicotine dependence: Secondary | ICD-10-CM | POA: Diagnosis not present

## 2017-08-19 DIAGNOSIS — Z8042 Family history of malignant neoplasm of prostate: Secondary | ICD-10-CM | POA: Diagnosis not present

## 2017-08-19 DIAGNOSIS — Z7901 Long term (current) use of anticoagulants: Secondary | ICD-10-CM | POA: Diagnosis not present

## 2017-08-19 DIAGNOSIS — Z9981 Dependence on supplemental oxygen: Secondary | ICD-10-CM | POA: Insufficient documentation

## 2017-08-19 DIAGNOSIS — Z800 Family history of malignant neoplasm of digestive organs: Secondary | ICD-10-CM | POA: Diagnosis not present

## 2017-08-19 DIAGNOSIS — I4891 Unspecified atrial fibrillation: Secondary | ICD-10-CM | POA: Diagnosis not present

## 2017-08-19 DIAGNOSIS — I130 Hypertensive heart and chronic kidney disease with heart failure and stage 1 through stage 4 chronic kidney disease, or unspecified chronic kidney disease: Secondary | ICD-10-CM | POA: Diagnosis not present

## 2017-08-19 DIAGNOSIS — H9190 Unspecified hearing loss, unspecified ear: Secondary | ICD-10-CM | POA: Insufficient documentation

## 2017-08-19 DIAGNOSIS — E1122 Type 2 diabetes mellitus with diabetic chronic kidney disease: Secondary | ICD-10-CM

## 2017-08-19 LAB — BASIC METABOLIC PANEL
Anion gap: 7 (ref 5–15)
BUN: 74 mg/dL — ABNORMAL HIGH (ref 8–23)
CO2: 27 mmol/L (ref 22–32)
Calcium: 9.3 mg/dL (ref 8.9–10.3)
Chloride: 103 mmol/L (ref 98–111)
Creatinine, Ser: 3.55 mg/dL — ABNORMAL HIGH (ref 0.61–1.24)
GFR calc Af Amer: 17 mL/min — ABNORMAL LOW (ref >60)
GFR calc non Af Amer: 15 mL/min — ABNORMAL LOW (ref >60)
Glucose, Bld: 207 mg/dL — ABNORMAL HIGH (ref 70–99)
Potassium: 5.1 mmol/L (ref 3.5–5.1)
Sodium: 137 mmol/L (ref 135–145)

## 2017-08-19 NOTE — Telephone Encounter (Signed)
Spoke to patient's wife regarding lab results obtained today (08/19/17). Potassium is stable at 5.1 although renal function has declined in the last 10 days. Creatinine is now 3.55 and GFR is now 17 (10 days ago, GFR was 30). Is drinking about 40-50 ounces of fluids daily.   Patient has been taking lisinopril 5mg  daily along with furosemide 20mg  BID and glipizide 2.5mg  daily.   Instructed patient's wife to stop lisinopril completely. Will recheck BMP on 08/22/17. Nephrology referral [name]) also made for 09/15/17 (soonest appt they had). May also need to decrease furosemide or have PCP give something different for his diabetes.  Patient's wife verbalized understanding of above.

## 2017-08-19 NOTE — Patient Instructions (Signed)
Continue weighing daily and call for an overnight weight gain of > 2 pounds or a weekly weight gain of >5 pounds. 

## 2017-08-19 NOTE — Progress Notes (Signed)
Patient ID: [name], male    DOB: 1933/11/27, 84 y.o.   MRN: [id number]  HPI  [name] is a 84 y/o male with a history of DM, HTN, CKD, atrial fibrillation, previous tobacco use and chronic heart failure.   Echo report from 08/02/17 reviewed and showed an EF of 35-40% along with mild Glenn.   Was in the ED 08/09/17 due to headache. Head CT was negative. Given IV fluids with resolution of headache and he was released. Admitted 08/07/17 due to AF with RVR and chest pain. Troponins negative X3. Cardiology consult obtained. Discharged the following day after medication changes. Was in the ED 08/06/17 due to AF with RVR. Given IV lopressor and small bolus of IV fluids. Troponin negative and he was released. Admitted 08/01/17 due to acute on chronic HF. Cardiology consult obtained. Medications adjusted. Elevated troponin thought to be due to demand ischemia. Discharged after 3 days.   He presents today for his initial visit with a chief complaint of moderate fatigue upon minimal exertion. He says this has been chronic in nature having been present for several years. He has associated decreased appetite, dry cough, shortness of breath, dizziness and chronic neck pain along with this. He denies any difficulty sleeping, abdominal distention, palpitations, pedal edema, chest pain or weight gain. Lisinopril was on hold at most recent discharge summary but somehow he's still been taking it.   Past Medical History:  Diagnosis Date  . Atrial fibrillation ([place])   . CHF (congestive heart failure) ([place])   . Diabetes ([place])   . Hearing loss   . High blood pressure   . History of bladder problems   . Kidney stones 11/22/2014   Past Surgical History:  Procedure Laterality Date  . CATARACT EXTRACTION    . PROSTATE CANCER    . PROSTATE SURGERY     Family History  Problem Relation Age of Onset  . Colon cancer Mother   . Diabetes Unknown   . High blood pressure Unknown   . Prostate cancer Brother   .  Diabetes Brother    Social History   Tobacco Use  . Smoking status: Former Smoker    Years: 16.00    Types: Cigarettes  . Smokeless tobacco: Never Used  Substance Use Topics  . Alcohol use: No   No Known Allergies Prior to Admission medications   Medication Sig Start Date End Date Taking? Authorizing Provider  atorvastatin (LIPITOR) 40 MG tablet Take 1 tablet (40 mg total) by mouth daily at 6 PM. 08/04/17  Yes Demetrios Loll, MD  benzonatate (TESSALON PERLES) 100 MG capsule Take 1 capsule (100 mg total) by mouth every 6 (six) hours as needed for cough. 08/06/17 08/06/18 Yes Merlyn Lot, MD  carvedilol (COREG) 25 MG tablet Take 0.5 tablets (12.5 mg total) by mouth 2 (two) times daily with a meal. Patient taking differently: Take 25 mg by mouth 2 (two) times daily with a meal.  08/08/17  Yes Mayo, Pete Pelt, MD  chlorpheniramine-HYDROcodone (TUSSIONEX) 10-8 MG/5ML SUER Take 5 mLs by mouth every 12 (twelve) hours as needed for cough. 08/04/17  Yes Demetrios Loll, MD  furosemide (LASIX) 20 MG tablet Take 1 tablet (20 mg total) by mouth 2 (two) times daily. 08/04/17  Yes Demetrios Loll, MD  glipiZIDE (GLUCOTROL XL) 2.5 MG 24 hr tablet Take 1 tablet (2.5 mg total) by mouth daily with breakfast. 08/08/17  Yes Mayo, Pete Pelt, MD  lisinopril (PRINIVIL,ZESTRIL) 10 MG tablet Take 5 mg  by mouth daily.    Yes [provider]  nitroGLYCERIN (NITROSTAT) 0.4 MG SL tablet Place 1 tablet (0.4 mg total) under the tongue every 5 (five) minutes as needed for chest pain. 05/05/16  Yes Theodoro Grist, MD  ONETOUCH VERIO test strip USE UTD TO CHECK BLOOD SUGAR ONCE D 02/03/17  Yes Boscia, Heather E, NP  pantoprazole (PROTONIX) 40 MG tablet Take 1 tablet (40 mg total) by mouth daily. 08/08/17  Yes Mayo, Pete Pelt, MD  PRADAXA 150 MG CAPS capsule Take 150 mg by mouth 2 (two) times daily. 04/05/16  Yes [provider]    Review of Systems  Constitutional: Positive for appetite change (get full easily) and  fatigue.  Eyes: Negative.   Respiratory: Positive for cough (dry cough) and shortness of breath. Negative for chest tightness.   Cardiovascular: Negative for chest pain, palpitations and leg swelling.  Gastrointestinal: Positive for diarrhea ("for almost 2 years"). Negative for abdominal distention and abdominal pain.  Endocrine: Negative.   Genitourinary: Negative.   Musculoskeletal: Positive for neck pain (knot for many years). Negative for back pain.  Skin: Negative.   Allergic/Immunologic: Negative.   Neurological: Positive for dizziness and light-headedness.  Hematological: Negative for adenopathy. Does not bruise/bleed easily.  Psychiatric/Behavioral: Negative for dysphoric mood and sleep disturbance. The patient is not nervous/anxious.    Vitals:   08/19/17 0914  BP: 102/84  Pulse: 86  Resp: 18  SpO2: 95%  Weight: 244 lb (110.7 kg)  Height: 6' (1.829 m)   Wt Readings from Last 3 Encounters:  08/19/17 244 lb (110.7 kg)  08/15/17 246 lb (111.6 kg)  08/09/17 247 lb (112 kg)    Lab Results  Component Value Date   CREATININE 2.20 (H) 08/09/2017   CREATININE 2.38 (H) 08/08/2017   CREATININE 2.60 (H) 08/07/2017    Physical Exam  Constitutional: He is oriented to person, place, and time. He appears well-developed and well-nourished.  HENT:  Head: Normocephalic and atraumatic.  Neck: Normal range of motion. Neck supple. No JVD present.  Cardiovascular: Normal rate. An irregular rhythm present.  Pulmonary/Chest: Effort normal. No respiratory distress. He has no wheezes. He has no rales.  Abdominal: Soft. He exhibits no distension.  Musculoskeletal:       Right lower leg: He exhibits no tenderness and no edema.       Left lower leg: He exhibits no tenderness and no edema.  Neurological: He is alert and oriented to person, place, and time.  Skin: Skin is warm and dry.  Psychiatric: He has a normal mood and affect. His behavior is normal.  Nursing note and vitals  reviewed.  Assessment & Plan:  1: Chronic heart failure with reduced ejection fraction- - NYHA class III - euvolemic today - weighing daily and he was instructed to call for an overnight weight gain of >2 pounds or a weekly weight gain of >5 pounds - not adding salt and his wife has been reading food labels. Reviewed the importance of closely following a 2000mg  sodium diet and written dietary information was given to her about this. She is rinsing off any canned vegetables that they have prior to using them - saw cardiology [name]) 08/11/17 & returns in 2 weeks - wearing TED hose daily - drinking ~ 40+ ounces of fluids daily - will get a BMP today since he's still on lisinopril with CKD - BNP 08/06/17 was 149.0 - PharmD reconciled medications with patient and his wife - has oxygen at home that  he's supposed to be wearing at night. Encouraged him to begin wearing it and see if his energy level improves as it's possible his O2 levels are dropping while asleep - discussed paramedicine program and they are interested so will make a referral for that  2: HTN- - BP looks good today - saw PCP Versie Starks) 08/15/17 - BMP 08/09/17 reviewed and showed sodium 138, potassium 5.0 and GFR 30; discussed maybe needing nephrology referral  3: DM- - glucose at home was 123 - A1c 07/01/17 was 6.9%  Medication list was reviewed and it was emphasized that they bring all the bottles to every visit every time to ensure accuracy.  Return in 6 weeks or sooner for any questions/problems before then.

## 2017-08-21 ENCOUNTER — Telehealth: Payer: Self-pay | Admitting: Family

## 2017-08-21 ENCOUNTER — Other Ambulatory Visit (HOSPITAL_COMMUNITY): Payer: Self-pay

## 2017-08-21 MED ORDER — FUROSEMIDE 20 MG PO TABS: 20.0000 mg | ORAL_TABLET | Freq: Every day | ORAL | 1 refills | Status: DC

## 2017-08-21 NOTE — Telephone Encounter (Signed)
Reminded patient's wife about bringing patient to PAT tomorrow morning to get his lab work drawn.   She says that he doesn't have any swelling and that his weight has been stable at 240 pounds. Instructed her to decrease his furosemide to just once daily. Patient's wife verbalized understanding.

## 2017-08-21 NOTE — Progress Notes (Signed)
Bp: 102/70, pulse: 61, resp: 18, pulse ox: 97%, weight 244 lbs  Today pt states feels good. Introduce myself and explained the program to him and wife.  They are ok with me coming out to visit.  We went over the red, yellow and green zones.  Talked about diet.  Verified meds and filled med box for him.  [place] about Lisinopril, she advised he is suppose to be off it. Lungs were clear, some swelling in ankle area and none in legs.  Pt talked about going to work we discussed activity to do around the house and start walking short distances before he tries work.  Advised him to start off slow and build up, he agreed.  His goal is maybe to try some work in September, a month from now.  Pt denies chest pain, headaches or dizziness.  Pt states has a dry hacky cough sometimes.  Will visit patient weekly and educate on meds and diet.  [place] [phone]

## 2017-08-22 ENCOUNTER — Telehealth: Payer: Self-pay | Admitting: Family

## 2017-08-22 ENCOUNTER — Encounter
Admission: RE | Admit: 2017-08-22 | Discharge: 2017-08-22 | Disposition: A | Discharge status: Home / Self Care | Payer: Medicare Other | Source: Ambulatory Visit | Attending: Family | Admitting: Family

## 2017-08-22 DIAGNOSIS — Z79899 Other long term (current) drug therapy: Secondary | ICD-10-CM | POA: Insufficient documentation

## 2017-08-22 DIAGNOSIS — Z01818 Encounter for other preprocedural examination: Secondary | ICD-10-CM | POA: Insufficient documentation

## 2017-08-22 DIAGNOSIS — N189 Chronic kidney disease, unspecified: Secondary | ICD-10-CM | POA: Diagnosis not present

## 2017-08-22 LAB — BASIC METABOLIC PANEL
Anion gap: 8 (ref 5–15)
BUN: 71 mg/dL — ABNORMAL HIGH (ref 8–23)
CO2: 26 mmol/L (ref 22–32)
Calcium: 9.4 mg/dL (ref 8.9–10.3)
Chloride: 102 mmol/L (ref 98–111)
Creatinine, Ser: 3.19 mg/dL — ABNORMAL HIGH (ref 0.61–1.24)
GFR calc Af Amer: 19 mL/min — ABNORMAL LOW (ref >60)
GFR calc non Af Amer: 17 mL/min — ABNORMAL LOW (ref >60)
Glucose, Bld: 258 mg/dL — ABNORMAL HIGH (ref 70–99)
Potassium: 5 mmol/L (ref 3.5–5.1)
Sodium: 136 mmol/L (ref 135–145)

## 2017-08-22 NOTE — Telephone Encounter (Signed)
Spoke with patient's wife regarding lab results that were obtained today (08/22/17). Renal function is slightly better from a few days ago with medication changes. Creatinine is down to 3.19 and GFR up to 19.   Advised patient's wife to continue holding lisinopril and only giving the furosemide once daily and to keep the nephrology appointment scheduled for later this month. Will recheck a BMP on 08/28/17.   Patient's wife verbalized understanding.

## 2017-08-25 ENCOUNTER — Other Ambulatory Visit: Payer: Self-pay | Admitting: Family

## 2017-08-26 DIAGNOSIS — R0989 Other specified symptoms and signs involving the circulatory and respiratory systems: Secondary | ICD-10-CM | POA: Diagnosis not present

## 2017-08-27 ENCOUNTER — Emergency Department
Admission: EM | Admit: 2017-08-27 | Discharge: 2017-08-27 | Disposition: A | Discharge status: Home / Self Care | Payer: Medicare Other | Attending: Emergency Medicine | Admitting: Emergency Medicine

## 2017-08-27 ENCOUNTER — Other Ambulatory Visit: Payer: Self-pay

## 2017-08-27 ENCOUNTER — Emergency Department: Payer: Medicare Other

## 2017-08-27 ENCOUNTER — Telehealth (HOSPITAL_COMMUNITY): Payer: Self-pay

## 2017-08-27 ENCOUNTER — Encounter: Payer: Self-pay | Admitting: Emergency Medicine

## 2017-08-27 DIAGNOSIS — R05 Cough: Secondary | ICD-10-CM

## 2017-08-27 DIAGNOSIS — I130 Hypertensive heart and chronic kidney disease with heart failure and stage 1 through stage 4 chronic kidney disease, or unspecified chronic kidney disease: Secondary | ICD-10-CM | POA: Diagnosis not present

## 2017-08-27 DIAGNOSIS — Z79899 Other long term (current) drug therapy: Secondary | ICD-10-CM | POA: Diagnosis not present

## 2017-08-27 DIAGNOSIS — R0989 Other specified symptoms and signs involving the circulatory and respiratory systems: Secondary | ICD-10-CM | POA: Diagnosis not present

## 2017-08-27 DIAGNOSIS — F458 Other somatoform disorders: Secondary | ICD-10-CM | POA: Insufficient documentation

## 2017-08-27 DIAGNOSIS — Z7984 Long term (current) use of oral hypoglycemic drugs: Secondary | ICD-10-CM | POA: Diagnosis not present

## 2017-08-27 DIAGNOSIS — E1122 Type 2 diabetes mellitus with diabetic chronic kidney disease: Secondary | ICD-10-CM | POA: Insufficient documentation

## 2017-08-27 DIAGNOSIS — Z87891 Personal history of nicotine dependence: Secondary | ICD-10-CM | POA: Diagnosis not present

## 2017-08-27 DIAGNOSIS — I5022 Chronic systolic (congestive) heart failure: Secondary | ICD-10-CM | POA: Insufficient documentation

## 2017-08-27 DIAGNOSIS — N189 Chronic kidney disease, unspecified: Secondary | ICD-10-CM | POA: Insufficient documentation

## 2017-08-27 DIAGNOSIS — R0981 Nasal congestion: Secondary | ICD-10-CM | POA: Diagnosis not present

## 2017-08-27 DIAGNOSIS — R059 Cough, unspecified: Secondary | ICD-10-CM

## 2017-08-27 NOTE — ED Provider Notes (Addendum)
[facility] Emergency Department Provider Note  ____________________________________________   First MD Initiated Contact with Patient 08/27/17 [phone]     (approximate)  I have reviewed the triage vital signs and the nursing notes.   HISTORY  Chief Complaint Foreign Body    HPI [name] is a 84 y.o. male with medical history as listed below who presents for evaluation of foreign body sensation in his throat.  He states that he took Tylenol earlier today and feels like it got stuck on the way down.  He has had a persistent foreign body sensation since that time.  He states that he drank 3 bottles water trying to push down to get rid of the sensation but it did not help.  He has not tried eating anything.  The pain discomfort is mild to moderate but it is bothering him.  He also has had a cough for several weeks that does not seem to be directly related to his symptoms tonight.  Nothing in particular makes his foreign body sensation or cough better or worse.  He denies chest pain, nausea, vomiting, abdominal pain.  The headache he had earlier for which he took the Tylenol has resolved.  Past Medical History:  Diagnosis Date  . Atrial fibrillation ([place])   . CHF (congestive heart failure) ([place])   . Diabetes ([place])   . Hearing loss   . High blood pressure   . History of bladder problems   . Kidney stones 11/22/2014    Patient Active Problem List   Diagnosis Date Noted  . Chronic systolic heart failure ([place]) 08/19/2017  . Atrial fibrillation with RVR ([place]) 08/02/2017  . Acute CHF (congestive heart failure) ([place]) 08/02/2017  . Prostatic hypertrophy 07/20/2017  . Hyperpigmentation 04/10/2017  . Uncontrolled type 2 diabetes mellitus with hyperglycemia ([place]) 03/24/2017  . Encounter for general adult medical examination with abnormal findings 03/24/2017  . Chest pain 05/04/2016  . Elevated troponin 05/04/2016  . Essential hypertension 07/06/2014  . Cough  03/21/2014  . OSA (obstructive sleep apnea) 03/21/2014  . Paroxysmal A-fib ([place]) 03/21/2014  . Hx of adenomatous colonic polyps 06/28/2013  . Liver cyst 06/28/2013  . Chronic kidney disease 06/08/2013  . Diabetes ([place]) 06/08/2013  . Hyperlipidemia, mixed 06/08/2013  . Valvular heart disease 06/08/2013    Past Surgical History:  Procedure Laterality Date  . CATARACT EXTRACTION    . PROSTATE CANCER    . PROSTATE SURGERY      Prior to Admission medications   Medication Sig Start Date End Date Taking? Authorizing Provider  atorvastatin (LIPITOR) 40 MG tablet Take 1 tablet (40 mg total) by mouth daily at 6 PM. 08/04/17  Yes Demetrios Loll, MD  carvedilol (COREG) 25 MG tablet Take 0.5 tablets (12.5 mg total) by mouth 2 (two) times daily with a meal. Patient taking differently: Take 25 mg by mouth 2 (two) times daily with a meal.  08/08/17  Yes Mayo, Pete Pelt, MD  furosemide (LASIX) 20 MG tablet Take 1 tablet (20 mg total) by mouth daily. 08/21/17  Yes Darylene Price A, FNP  glipiZIDE (GLUCOTROL XL) 2.5 MG 24 hr tablet Take 1 tablet (2.5 mg total) by mouth daily with breakfast. 08/08/17  Yes Mayo, Pete Pelt, MD  lisinopril (PRINIVIL,ZESTRIL) 10 MG tablet Take 5 mg by mouth daily.    Yes [provider]  [facility] VERIO test strip USE UTD TO CHECK BLOOD SUGAR ONCE D 02/03/17  Yes Boscia, Greer Ee, NP  pantoprazole ([place])  40 MG tablet Take 1 tablet (40 mg total) by mouth daily. 08/08/17  Yes Mayo, Pete Pelt, MD  PRADAXA 150 MG CAPS capsule Take 150 mg by mouth 2 (two) times daily. 04/05/16  Yes [provider]  nitroGLYCERIN (NITROSTAT) 0.4 MG SL tablet Place 1 tablet (0.4 mg total) under the tongue every 5 (five) minutes as needed for chest pain. Patient not taking: Reported on 08/21/2017 05/05/16   Theodoro Grist, MD    Allergies Patient has no known allergies.  Family History  Problem Relation Age of Onset  . Colon cancer Mother   . Diabetes Unknown   . High blood pressure  Unknown   . Prostate cancer Brother   . Diabetes Brother     Social History Social History   Tobacco Use  . Smoking status: Former Smoker    Years: 16.00    Types: Cigarettes  . Smokeless tobacco: Never Used  Substance Use Topics  . Alcohol use: No  . Drug use: No    Review of Systems Constitutional: No fever/chills Eyes: No visual changes. ENT: Foreign body sensation in the throat since taking a large Tylenol  Cardiovascular: Denies chest pain. Respiratory: Denies shortness of breath.  Persistent cough for several weeks.` Gastrointestinal: No abdominal pain.  No nausea, no vomiting.  No diarrhea.  No constipation. Genitourinary: Negative for dysuria. Musculoskeletal: Negative for neck pain.  Negative for back pain. Integumentary: Negative for rash. Neurological: Negative for headaches, focal weakness or numbness.   ____________________________________________   PHYSICAL EXAM:  VITAL SIGNS: ED Triage Vitals  Enc Vitals Group     BP 08/27/17 0024 125/66     Pulse Rate 08/27/17 0024 (!) 56     Resp 08/27/17 0024 (!) 24     Temp 08/27/17 0024 97.7 F (36.5 C)     Temp Source 08/27/17 0024 Oral     SpO2 08/27/17 0024 95 %     Weight 08/27/17 0026 108.9 kg (240 lb)     Height --      Head Circumference --      Peak Flow --      Pain Score 08/27/17 0026 0     Pain Loc --      Pain Edu? --      Excl. in [place]? --     Constitutional: Alert and oriented. Well appearing and in no acute distress. Eyes: Conjunctivae are normal.  Head: Atraumatic. Nose: No congestion/rhinnorhea. Mouth/Throat: Mucous membranes are moist.  Oropharynx non-erythematous. Neck: No stridor.  No meningeal signs.   Cardiovascular: Irregular rhythm, normal rate, occasionally slightly bradycardic. Good peripheral circulation. Grossly normal heart sounds. Respiratory: Normal respiratory effort.  No retractions. Lungs CTAB.  Occasional nonproductive cough. Gastrointestinal: Soft and nontender. No  distention.  Musculoskeletal: No lower extremity tenderness nor edema. No gross deformities of extremities. Neurologic:  Normal speech and language. No gross focal neurologic deficits are appreciated.  Skin:  Skin is warm, dry and intact. No rash noted.   ____________________________________________   LABS (all labs ordered are listed, but only abnormal results are displayed)  Labs Reviewed - No data to display ____________________________________________  EKG  ED ECG REPORT I, Hinda Kehr, the attending physician, personally viewed and interpreted this ECG.  Date: 08/27/2017 EKG Time: 00:21 Rate: 119 Rhythm: a-fib, occasional PVC QRS Axis: normal Intervals: non-specific intraventricular conduction delay ST/T Wave abnormalities: Non-specific ST segment / T-wave changes, but no evidence of acute ischemia. Narrative Interpretation: no evidence of acute ischemia   ____________________________________________  RADIOLOGY  I, Hinda Kehr, personally viewed and evaluated these images (plain radiographs) as part of my medical decision making, as well as reviewing the written report by the radiologist.  ED MD interpretation: No indication of foreign body present on soft tissue neck x-rays.  No indication of pulmonary edema or pneumonia on chest x-rays.  Official radiology report(s): Dg Neck Soft Tissue  Result Date: 08/27/2017 CLINICAL DATA:  [place] presents after Tylenol got stuck throat. EXAM: NECK SOFT TISSUES - 1+ VIEW COMPARISON:  10/07/2006 FINDINGS: There is no evidence of retropharyngeal soft tissue swelling or epiglottic enlargement. Cervical spondylosis with moderate disc space narrowing from C2 through C7. The cervical airway is unremarkable and no radio-opaque foreign body identified. Patient is edentulous in appearance. IMPRESSION: No prevertebral soft tissue swelling or radiopaque foreign body identified. Cervical spondylosis. Electronically Signed   By: Ashley Royalty M.D.    On: 08/27/2017 01:19   Dg Chest 2 View  Result Date: 08/27/2017 CLINICAL DATA:  Cough with pill stuck in throat. Possible aspiration. EXAM: CHEST - 2 VIEW COMPARISON:  08/08/2017 FINDINGS: Stable cardiomegaly. Chronic interstitial prominence with left basilar scarring is stable. Slight uncoiling of the thoracic aorta. No findings of aspiration neuro from bowel. Stable patent trachea and mainstem bronchi. IMPRESSION: Stable cardiomegaly with slight uncoiling of the thoracic aorta. No evidence of aspiration pneumonia nor radiopaque foreign body visualized. Electronically Signed   By: Ashley Royalty M.D.   On: 08/27/2017 03:05    ____________________________________________   PROCEDURES  Critical Care performed: No   Procedure(s) performed:   Procedures   ____________________________________________   INITIAL IMPRESSION / ASSESSMENT AND PLAN / ED COURSE  As part of my medical decision making, I reviewed the following data within the [place] History obtained from family, Nursing notes reviewed and incorporated, Radiograph reviewed  and Notes from prior ED visits    Differential diagnosis includes, but is not limited to, globus sensation, persistent esophageal foreign body, airway obstruction, pulmonary edema, pneumonia.  The patient's vital signs are stable and he is not having any difficulty breathing.  He has had 3 bottles of water at home and we gave him a meal tray and something to drink in the emergency department and had no difficulty swallowing.  No radiopaque foreign bodies on soft tissue neck and no evidence of prevertebral edema.  Because of the persistent cough I obtained a chest x-ray but there is no sign of pulmonary edema or pneumonia.  I attempted to provide reassurance and I explained globus sensation to the patient and his family.  I offered a GI cocktail with lidocaine which might help soothe and numb the patient's throat, but his wife was concerned about  possible medication side effects because the patient is sensitive to many medications so they declined to take the medicine at this time.  There is no indication for further work-up or endoscopy given that he does not have any obstruction and the Tylenol tablet should not cause any concern for necrosis or esophageal injury.  I gave my usual customary return precautions and they are comfortable with the plan for outpatient follow-up.     ____________________________________________  FINAL CLINICAL IMPRESSION(S) / ED DIAGNOSES  Final diagnoses:  Globus sensation  Cough     MEDICATIONS GIVEN DURING THIS VISIT:  Medications - No data to display   ED Discharge Orders    None       Note:  This document was prepared using Dragon voice recognition software and may include unintentional  dictation errors.    Hinda Kehr, MD 08/27/17 3710    Hinda Kehr, MD 09/08/17 2139

## 2017-08-27 NOTE — ED Notes (Signed)
Pt states feeling of something in throat again, patient repositioned sitting up.  Pt speaking complete and coherent sentences, oxygen sats remain stable.  MD notified.

## 2017-08-27 NOTE — ED Notes (Signed)
Patient transported to X-ray 

## 2017-08-27 NOTE — Telephone Encounter (Signed)
[name]'s wife called and wanted to see if he could take a tylenol due to a headache today.  I advised he could, he was at ER last night and was up all night without sleep.  I also advised if he ever gets a pill stuck to eat bread and it should help get it unstuck.  She also advised his sugar was 180, should she take him to the doctor, advised her it was from what he ate and if he took his regular meds and watches what he eats should go down.  To recheck it this afternoon.  He just took his morning meds.  Also will visit [name] tomorrow.   [place] [phone]

## 2017-08-27 NOTE — ED Notes (Signed)
Pt given meal tray and milk.

## 2017-08-27 NOTE — ED Triage Notes (Signed)
Pt to ED via EMS from home c/o tylenol stuck in throat.  States was taking tylenol for a headache this evening and has sensation of tylenol still being in throat.  States drank 3 bottles of water in attempt to dislodge pill without success.  Pt speaking in complete and coherent sentences, chest rise even and unlabored.  EMS vitals 97% RA, hx a.fib and CHF.

## 2017-08-27 NOTE — Discharge Instructions (Addendum)
As we discussed, we believe you are continue to have a "globus sensation" after getting of Tylenol secondary throat for a while; it continues to feel irritated even after the pill has gone down.  Since you are able to eat and drink without any difficulty it is not causing a blockage and it should continue to feel better with time.  Your x-rays were reassuring with no evidence of any acute or emergent medical condition.  Please call your regular doctor later today to schedule a follow-up appointment.  Return to the emergency department if you develop new or worsening symptoms that concern you.

## 2017-08-28 ENCOUNTER — Encounter
Admission: RE | Admit: 2017-08-28 | Discharge: 2017-08-28 | Disposition: A | Discharge status: Home / Self Care | Payer: Medicare Other | Source: Ambulatory Visit | Attending: Family | Admitting: Family

## 2017-08-28 ENCOUNTER — Other Ambulatory Visit (HOSPITAL_COMMUNITY): Payer: Self-pay

## 2017-08-28 DIAGNOSIS — Z01812 Encounter for preprocedural laboratory examination: Secondary | ICD-10-CM | POA: Insufficient documentation

## 2017-08-28 LAB — BASIC METABOLIC PANEL
Anion gap: 9 (ref 5–15)
BUN: 55 mg/dL — ABNORMAL HIGH (ref 8–23)
CO2: 26 mmol/L (ref 22–32)
Calcium: 9.4 mg/dL (ref 8.9–10.3)
Chloride: 104 mmol/L (ref 98–111)
Creatinine, Ser: 2.84 mg/dL — ABNORMAL HIGH (ref 0.61–1.24)
GFR calc Af Amer: 22 mL/min — ABNORMAL LOW (ref >60)
GFR calc non Af Amer: 19 mL/min — ABNORMAL LOW (ref >60)
Glucose, Bld: 253 mg/dL — ABNORMAL HIGH (ref 70–99)
Potassium: 4.8 mmol/L (ref 3.5–5.1)
Sodium: 139 mmol/L (ref 135–145)

## 2017-08-28 NOTE — Progress Notes (Signed)
Bgl: 99 bp: 100/60, pulse: 52, resp: 18, spo2: 97. Last weight: 244, todays weight: 240   Pt states feeling good today.  He states started back driving, been more active.  Lungs are clear, some swelling in ankle area, no socks on today with shoes.  Advised patient about wearing socks.  Per Otila Kluver continue Lasix 20 mg x1 daily and stop lisinopril. Called in refills glipizide, pantoprazole, atorvastatin.  Meds filled in med box and meds verified.  Discussed diet and fluid intake.  Pt states he is becoming stronger and has more energy.  He states uses oxygen only at night. Been sleeping well.  He is aware of meds and why he should take them.  Occasional headaches, denies dizziness or chest pain. Will continue to visit every week.   [place] [phone]

## 2017-09-01 ENCOUNTER — Ambulatory Visit: Payer: Medicare Other | Admitting: Urology

## 2017-09-01 ENCOUNTER — Other Ambulatory Visit (HOSPITAL_COMMUNITY): Payer: Self-pay

## 2017-09-01 NOTE — Progress Notes (Signed)
Pt wife called and went over the meds that she is to refill today.  She gets very confused with his medications.  She also wanted to make sure that he is to take furosemide in AM, advised her yes, none at night.  Advised her to use the med box I filled up with his meds.   [place] [phone]

## 2017-09-04 ENCOUNTER — Other Ambulatory Visit: Payer: Self-pay

## 2017-09-04 ENCOUNTER — Encounter (HOSPITAL_COMMUNITY): Payer: Self-pay

## 2017-09-04 ENCOUNTER — Other Ambulatory Visit: Payer: Self-pay | Admitting: Adult Health

## 2017-09-04 ENCOUNTER — Other Ambulatory Visit (HOSPITAL_COMMUNITY): Payer: Self-pay

## 2017-09-04 MED ORDER — GLIPIZIDE ER 2.5 MG PO TB24: 2.5000 mg | ORAL_TABLET | Freq: Every day | ORAL | 0 refills | Status: DC

## 2017-09-04 MED ORDER — PANTOPRAZOLE SODIUM 40 MG PO TBEC: 40.0000 mg | DELAYED_RELEASE_TABLET | Freq: Every day | ORAL | 0 refills | Status: DC

## 2017-09-04 NOTE — Progress Notes (Signed)
Bp: 102/70, pulse: 87, resp: 18, spo2: 98, Bgl: 133 last weight: 240, todays: 240  Pt states feels good and ready to get out of the house and go to work.  Wife is very concerned and keeps him at home.  Advised him to start slow, do some walking maybe at the mall where there is places to rest.  Told him to go visit work if he misses it, get cardiology to let him know when he can return.  Start off slow with activity and increase slowly.  Stop when ever you are short of breath and feel exhausted.  Called [facility] for refill on protonix and glipizide due to [facility] advised today they can not refill them.  Med box refilled and meds verified.  Lungs clear, no swelling in extremities.  Pt denies chest pain, dizziness or shortness of breath.  Pt only on oxygen when sleeps.  Discussed diet and fluid intake.  Will continue to visit patient weekly.   [place] [phone]

## 2017-09-09 DIAGNOSIS — I10 Essential (primary) hypertension: Secondary | ICD-10-CM | POA: Diagnosis not present

## 2017-09-09 DIAGNOSIS — I5021 Acute systolic (congestive) heart failure: Secondary | ICD-10-CM | POA: Diagnosis not present

## 2017-09-09 DIAGNOSIS — I38 Endocarditis, valve unspecified: Secondary | ICD-10-CM | POA: Diagnosis not present

## 2017-09-09 DIAGNOSIS — I482 Chronic atrial fibrillation: Secondary | ICD-10-CM | POA: Diagnosis not present

## 2017-09-09 DIAGNOSIS — G4733 Obstructive sleep apnea (adult) (pediatric): Secondary | ICD-10-CM | POA: Diagnosis not present

## 2017-09-11 ENCOUNTER — Other Ambulatory Visit (HOSPITAL_COMMUNITY): Payer: Self-pay

## 2017-09-11 NOTE — Progress Notes (Signed)
Bp: 104/64, pulse: 61, resp: 16, spo2: 98 last weight: 240 todays : 242 Bgl: 147  Today [name] states he feels pretty good.  Denies chest pain, dizziness or shortness of breath.  He states he did some walking this week, did not get short of breath or have chest pain.  Lungs clear.  He states seen cardiology and they advised he could start off slow and go back to work.  He plans to go to work middle of next week.  He states he will take it slow, his wife giving him a hard time about it.  No swelling in extremities.  He is watching what he eats.  Meds verified and med box filled.  He weighs everyday, charts his weight and blood sugar.  His weight stays within 2 lbs.  Will continue to visit weekly.

## 2017-09-18 ENCOUNTER — Other Ambulatory Visit (HOSPITAL_COMMUNITY): Payer: Self-pay

## 2017-09-18 NOTE — Progress Notes (Signed)
Try to visit with [name] today but he has went back to work. Filled his med box up. All meds were missing in med box.  Will get back in touch with [name] to plan a visit with him next week. His wife states he felt good after working yesterday.  She states he is watching his diet.   [place] [phone]

## 2017-09-19 DIAGNOSIS — G4733 Obstructive sleep apnea (adult) (pediatric): Secondary | ICD-10-CM | POA: Diagnosis not present

## 2017-09-22 ENCOUNTER — Other Ambulatory Visit: Payer: Self-pay | Admitting: Adult Health

## 2017-09-24 ENCOUNTER — Other Ambulatory Visit (HOSPITAL_COMMUNITY): Payer: Self-pay

## 2017-09-24 ENCOUNTER — Encounter (HOSPITAL_COMMUNITY): Payer: Self-pay

## 2017-09-24 NOTE — Progress Notes (Signed)
Bp: 112/76, pulse: 68, resp: 18, spo2: 94 last weight: 242 todays weight: 240 lbs Bgl: 142  Today visiting with [name] he has since went back to work.  Has some swelling in extremities, he stands all day at work.  He states has only drank 24 ounces water all day and not urinated but twice.  Advised he needs to drink 64 ounces a day.  He has not noticed swelling other days, did leave early today because his legs were sore.  Advised to elevate legs and contact me if weight gain in am more than 3 lbs. Verified meds and watched as his wife refilled med box. He has been watching what he eats.  He denies shortness of breath, headaches or chest pain.  He states work has been going ok.  He states been sleeping good at night. Wears oxygen at night. Will continue to visit with him and wife to make sure they are able to take meds right.   [place] EMT Paramedic [phone]

## 2017-09-25 ENCOUNTER — Other Ambulatory Visit: Payer: Self-pay

## 2017-09-25 MED ORDER — ATORVASTATIN CALCIUM 40 MG PO TABS: 40.0000 mg | ORAL_TABLET | Freq: Every day | ORAL | 5 refills | Status: DC

## 2017-09-26 NOTE — Progress Notes (Signed)
Patient ID: [name], male    DOB: 1934/01/13, 84 y.o.   MRN: [id number]  HPI  [name] is a 84 y/o male with a history of DM, HTN, CKD, atrial fibrillation, previous tobacco use and chronic heart failure.   Echo report from 08/02/17 reviewed and showed an EF of 35-40% along with mild Glenn.   Was in the ED 08/27/17 due to feeling like something was stuck in his throat. Was in the ED 08/09/17 due to headache. Head CT was negative. Given IV fluids with resolution of headache and he was released. Admitted 08/07/17 due to AF with RVR and chest pain. Troponins negative X3. Cardiology consult obtained. Discharged the following day after medication changes. Was in the ED 08/06/17 due to AF with RVR. Given IV lopressor and small bolus of IV fluids. Troponin negative and he was released. Admitted 08/01/17 due to acute on chronic HF. Cardiology consult obtained. Medications adjusted. Elevated troponin thought to be due to demand ischemia. Discharged after 3 days.   He presents today for a follow-up visit with a chief complaint of moderate fatigue upon minimal exertion. He says that this has been chronic in nature having been present for several years. He has associated cough and gradual weight gain along with this. He denies any difficulty sleeping, abdominal distention, palpitations, pedal edema, chest pain or dizziness. Had nephrology appointment scheduled but he cancelled it as he says that he doesn't want to go on dialysis.   Past Medical History:  Diagnosis Date  . Atrial fibrillation ([place])   . CHF (congestive heart failure) ([place])   . Diabetes ([place])   . Hearing loss   . High blood pressure   . History of bladder problems   . Kidney stones 11/22/2014   Past Surgical History:  Procedure Laterality Date  . CATARACT EXTRACTION    . PROSTATE CANCER    . PROSTATE SURGERY     Family History  Problem Relation Age of Onset  . Colon cancer Mother   . Diabetes Unknown   . High blood pressure  Unknown   . Prostate cancer Brother   . Diabetes Brother    Social History   Tobacco Use  . Smoking status: Former Smoker    Years: 16.00    Types: Cigarettes  . Smokeless tobacco: Never Used  Substance Use Topics  . Alcohol use: No   No Known Allergies  Prior to Admission medications   Medication Sig Start Date End Date Taking? Authorizing Provider  atorvastatin (LIPITOR) 40 MG tablet Take 1 tablet (40 mg total) by mouth daily at 6 PM. 09/25/17  Yes Boscia, Heather E, NP  carvedilol (COREG) 25 MG tablet Take 0.5 tablets (12.5 mg total) by mouth 2 (two) times daily with a meal. Patient taking differently: Take 25 mg by mouth 2 (two) times daily with a meal.  08/08/17  Yes Mayo, Pete Pelt, MD  furosemide (LASIX) 20 MG tablet Take 1 tablet (20 mg total) by mouth daily. 08/21/17  Yes Hackney, Tina A, FNP  glipiZIDE (GLUCOTROL XL) 2.5 MG 24 hr tablet TAKE 1 TABLET(2.5 MG) BY MOUTH DAILY WITH BREAKFAST 09/04/17  Yes Scarboro, Audie Clear, NP  nitroGLYCERIN (NITROSTAT) 0.4 MG SL tablet Place 1 tablet (0.4 mg total) under the tongue every 5 (five) minutes as needed for chest pain. 05/05/16  Yes Theodoro Grist, MD  [facility] VERIO test strip USE UTD TO CHECK BLOOD SUGAR ONCE D 02/03/17  Yes Ronnell Freshwater, NP  pantoprazole ([place])  40 MG tablet TAKE 1 TABLET(40 MG) BY MOUTH DAILY 09/04/17  Yes Scarboro, Audie Clear, NP  PRADAXA 150 MG CAPS capsule Take 150 mg by mouth 2 (two) times daily. 04/05/16  Yes [provider]    Review of Systems  Constitutional: Positive for fatigue. Negative for appetite change.  Eyes: Negative.   Respiratory: Positive for cough (dry cough) and shortness of breath. Negative for chest tightness.   Cardiovascular: Negative for chest pain, palpitations and leg swelling.  Gastrointestinal: Negative for abdominal distention and abdominal pain.  Endocrine: Negative.   Genitourinary: Negative.   Musculoskeletal: Positive for neck pain (knot for many years). Negative for  back pain.  Skin: Negative.   Allergic/Immunologic: Negative.   Neurological: Negative for dizziness and light-headedness.  Hematological: Negative for adenopathy. Does not bruise/bleed easily.  Psychiatric/Behavioral: Negative for dysphoric mood and sleep disturbance. The patient is not nervous/anxious.    Vitals:   09/29/17 1020  BP: 95/62  Pulse: 64  Resp: 18  SpO2: 97%  Weight: 251 lb 4 oz (114 kg)  Height: 6' (1.829 m)   Wt Readings from Last 3 Encounters:  09/29/17 251 lb 4 oz (114 kg)  09/24/17 241 lb (109.3 kg)  09/11/17 242 lb (109.8 kg)   Lab Results  Component Value Date   CREATININE 2.84 (H) 08/28/2017   CREATININE 3.19 (H) 08/22/2017   CREATININE 3.55 (H) 08/19/2017    Physical Exam  Constitutional: He is oriented to person, place, and time. He appears well-developed and well-nourished.  HENT:  Head: Normocephalic and atraumatic.  Neck: Normal range of motion. Neck supple. No JVD present.  Cardiovascular: Normal rate. An irregular rhythm present.  Pulmonary/Chest: Effort normal. No respiratory distress. He has no wheezes. He has no rales.  Abdominal: Soft. He exhibits no distension.  Musculoskeletal:       Right lower leg: He exhibits no tenderness and no edema.       Left lower leg: He exhibits no tenderness and no edema.  Neurological: He is alert and oriented to person, place, and time.  Skin: Skin is warm and dry.  Psychiatric: He has a normal mood and affect. His behavior is normal.  Nursing note and vitals reviewed.  Assessment & Plan:  1: Chronic heart failure with reduced ejection fraction- - NYHA class III - euvolemic today - weighing daily and he was reminded to call for an overnight weight gain of >2 pounds or a weekly weight gain of >5 pounds - weight up 7 pounds since he was last here 2 months ago - not adding salt and his wife has been reading food labels. Reviewed the importance of closely following a 2000mg  sodium diet  - has recently  ate at [place] as well as [employer]; did ask for fries that were fresh and unsalted - saw cardiology [name]) 09/09/17 - wearing TED hose daily - drinking ~ 40+ ounces of fluids daily - wearing oxygen at 2L at bedtime - BNP 08/06/17 was 149.0 - PharmD reconciled medications with patient and his wife - currently participating in paramedicine program   2: HTN- - BP on the low side today - saw PCP [name]) 08/15/17 - BMP 08/28/17 reviewed and showed sodium 139, potassium 4.8, creatinine 2.84 and GFR   3: DM- - glucose at home was 139 - A1c 07/01/17 was 6.9%  4: CKD- - discussed the need to get nephrology on board to help maintain/ and or improve renal function - patient is agreeable to this so his wife  says that she will call and get the appointment rescheduled.   Medication list was reviewed and it was emphasized that they bring all the bottles to every visit every time to ensure accuracy.  Return in 2 months or sooner for any questions/problems before then.

## 2017-09-29 ENCOUNTER — Ambulatory Visit: Payer: Medicare Other | Attending: Family | Admitting: Family

## 2017-09-29 ENCOUNTER — Encounter: Payer: Self-pay | Admitting: Family

## 2017-09-29 VITALS — BP 95/62 | HR 64 | Resp 18 | Ht 72.0 in | Wt 251.2 lb

## 2017-09-29 DIAGNOSIS — R5383 Other fatigue: Secondary | ICD-10-CM | POA: Insufficient documentation

## 2017-09-29 DIAGNOSIS — Z79899 Other long term (current) drug therapy: Secondary | ICD-10-CM | POA: Insufficient documentation

## 2017-09-29 DIAGNOSIS — N183 Chronic kidney disease, stage 3 unspecified: Secondary | ICD-10-CM

## 2017-09-29 DIAGNOSIS — Z800 Family history of malignant neoplasm of digestive organs: Secondary | ICD-10-CM | POA: Diagnosis not present

## 2017-09-29 DIAGNOSIS — Z8546 Personal history of malignant neoplasm of prostate: Secondary | ICD-10-CM | POA: Diagnosis not present

## 2017-09-29 DIAGNOSIS — I130 Hypertensive heart and chronic kidney disease with heart failure and stage 1 through stage 4 chronic kidney disease, or unspecified chronic kidney disease: Secondary | ICD-10-CM | POA: Insufficient documentation

## 2017-09-29 DIAGNOSIS — Z7901 Long term (current) use of anticoagulants: Secondary | ICD-10-CM | POA: Diagnosis not present

## 2017-09-29 DIAGNOSIS — Z87891 Personal history of nicotine dependence: Secondary | ICD-10-CM | POA: Diagnosis not present

## 2017-09-29 DIAGNOSIS — E1122 Type 2 diabetes mellitus with diabetic chronic kidney disease: Secondary | ICD-10-CM | POA: Insufficient documentation

## 2017-09-29 DIAGNOSIS — I10 Essential (primary) hypertension: Secondary | ICD-10-CM

## 2017-09-29 DIAGNOSIS — Z7984 Long term (current) use of oral hypoglycemic drugs: Secondary | ICD-10-CM | POA: Diagnosis not present

## 2017-09-29 DIAGNOSIS — I5022 Chronic systolic (congestive) heart failure: Secondary | ICD-10-CM

## 2017-09-29 DIAGNOSIS — N189 Chronic kidney disease, unspecified: Secondary | ICD-10-CM | POA: Insufficient documentation

## 2017-09-29 NOTE — Patient Instructions (Signed)
Continue weighing daily and call for an overnight weight gain of > 2 pounds or a weekly weight gain of >5 pounds. 

## 2017-10-02 ENCOUNTER — Other Ambulatory Visit: Payer: Self-pay | Admitting: Cardiovascular Disease

## 2017-10-02 ENCOUNTER — Other Ambulatory Visit: Payer: Self-pay

## 2017-10-02 ENCOUNTER — Telehealth: Payer: Self-pay | Admitting: Cardiovascular Disease

## 2017-10-02 ENCOUNTER — Telehealth (HOSPITAL_COMMUNITY): Payer: Self-pay

## 2017-10-02 MED ORDER — FUROSEMIDE 20 MG PO TABS: 20.0000 mg | ORAL_TABLET | Freq: Every day | ORAL | 0 refills | Status: DC

## 2017-10-02 MED ORDER — ATORVASTATIN CALCIUM 40 MG PO TABS: 40.0000 mg | ORAL_TABLET | Freq: Every day | ORAL | 5 refills | Status: DC

## 2017-10-02 NOTE — Telephone Encounter (Signed)
°*  STAT* If patient is at the pharmacy, call can be transferred to refill team.   1. Which medications need to be refilled? (please list name of each medication and dose if known) Lasix 20 mg po q day   2. Which pharmacy/location (including street and city if local pharmacy) is medication to be sent to? [facility] [address]   3. Do they need a 30 day or 90 day supply? [place] [facility] closed has appt to est care with [facility]

## 2017-10-02 NOTE — Telephone Encounter (Signed)
Contacted by Kennet's wife stating he is running out of meds.  Called Dr Gwenyth Ober office for refill of Furosemide 20 mg x1 daily to be called in to pharmacy and Contacted Dr Gwendel Hanson office for refill of his atorvistatin.  Advised wife the same.   [place] EMT Paramedic [phone]

## 2017-10-02 NOTE — Telephone Encounter (Signed)
Please advise if ok to refill Furosemide 20 mg tablet qd #90 day Request.  Pt has never been seen in office.  Pt has upcoming appointment with Dr. Rockey Situ 10/20/2017.

## 2017-10-02 NOTE — Telephone Encounter (Signed)
furosemide (LASIX) 20 MG tablet 30 tablet 0 10/02/2017    Sig - Route: Take 1 tablet (20 mg total) by mouth daily. - Oral   Sent to pharmacy as: furosemide (LASIX) 20 MG tablet   Notes to Pharmacy: Can only send in a 30 Day supply until patient establishes care on 10/20/2017.   E-Prescribing Status: Receipt confirmed by pharmacy (10/02/2017 9:44 AM EDT)   Pharmacy   [id number] - [place], [place]

## 2017-10-02 NOTE — Telephone Encounter (Signed)
Appears a 30 day supply has already been sent into pharmacy. Enough to get patient to establishing visit with Dr Rockey Situ. Refusing refill attached to this call.

## 2017-10-06 ENCOUNTER — Encounter: Payer: Self-pay | Admitting: Nurse Practitioner

## 2017-10-06 ENCOUNTER — Ambulatory Visit: Payer: Medicare Other | Admitting: Nurse Practitioner

## 2017-10-06 VITALS — BP 118/70 | HR 73 | Resp 16 | Ht 72.0 in | Wt 255.0 lb

## 2017-10-06 DIAGNOSIS — I480 Paroxysmal atrial fibrillation: Secondary | ICD-10-CM

## 2017-10-06 DIAGNOSIS — I10 Essential (primary) hypertension: Secondary | ICD-10-CM

## 2017-10-06 DIAGNOSIS — E1165 Type 2 diabetes mellitus with hyperglycemia: Secondary | ICD-10-CM | POA: Diagnosis not present

## 2017-10-06 DIAGNOSIS — H6123 Impacted cerumen, bilateral: Secondary | ICD-10-CM | POA: Diagnosis not present

## 2017-10-06 LAB — POCT HEMOGLOBIN: Hemoglobin: 7.8 g/dL — AB (ref 14.1–18.1)

## 2017-10-06 NOTE — Progress Notes (Signed)
[facility] [place], [place] [address]  Internal MEDICINE  Office Visit Note  Patient Name: [name]  [id number]  [id number]  Date of Service: 10/15/2017  Chief Complaint  Patient presents with  . Hypertension  . Diabetes    Patient here with his wife and states that ears feels as though they are full of wax. He has had this problem in the past and has had to have his ears washed out. They don't hurt, however, he is having a hard time hearing.   Hypertension  This is a chronic problem. The current episode started more than 1 year ago. The problem has been gradually improving since onset. The problem is controlled. Pertinent negatives include no chest pain, headaches, neck pain, palpitations or shortness of breath. There are no associated agents to hypertension. Risk factors for coronary artery disease include dyslipidemia, male gender and diabetes mellitus. Past treatments include beta blockers and diuretics. The current treatment provides moderate improvement. Compliance problems include exercise and diet.  Hypertensive end-organ damage includes CVA and PVD.  Diabetes  He presents for his follow-up diabetic visit. He has type 2 diabetes mellitus. No MedicAlert identification noted. His disease course has been improving. There are no hypoglycemic associated symptoms. Pertinent negatives for hypoglycemia include no dizziness, headaches, nervousness/anxiousness or tremors. Pertinent negatives for diabetes include no chest pain, no fatigue, no polydipsia, no polyphagia and no polyuria. There are no hypoglycemic complications. Symptoms are improving. Diabetic complications include a CVA and PVD. Risk factors for coronary artery disease include diabetes mellitus, dyslipidemia, hypertension, male sex, obesity and family history. Current diabetic treatment includes intensive insulin program. He is compliant with treatment all of the time. His weight is stable. He is  following a generally healthy diet. Meal planning includes avoidance of concentrated sweets. He has had a previous visit with a dietitian. He participates in exercise intermittently. His home blood glucose trend is decreasing rapidly. An ACE inhibitor/angiotensin II receptor blocker is being taken.       Current Medication: Outpatient Encounter Medications as of 10/06/2017  Medication Sig  . atorvastatin (LIPITOR) 40 MG tablet Take 1 tablet (40 mg total) by mouth daily at 6 PM.  . carvedilol (COREG) 25 MG tablet Take 0.5 tablets (12.5 mg total) by mouth 2 (two) times daily with a meal. (Patient taking differently: Take 25 mg by mouth 2 (two) times daily with a meal. )  . furosemide (LASIX) 20 MG tablet Take 1 tablet (20 mg total) by mouth daily.  Marland Kitchen glipiZIDE (GLUCOTROL XL) 2.5 MG 24 hr tablet TAKE 1 TABLET(2.5 MG) BY MOUTH DAILY WITH BREAKFAST  . nitroGLYCERIN (NITROSTAT) 0.4 MG SL tablet Place 1 tablet (0.4 mg total) under the tongue every 5 (five) minutes as needed for chest pain.  Glory Rosebush VERIO test strip USE UTD TO CHECK BLOOD SUGAR ONCE D  . pantoprazole (PROTONIX) 40 MG tablet TAKE 1 TABLET(40 MG) BY MOUTH DAILY  . PRADAXA 150 MG CAPS capsule Take 150 mg by mouth 2 (two) times daily.   No facility-administered encounter medications on file as of 10/06/2017.     Surgical History: Past Surgical History:  Procedure Laterality Date  . CATARACT EXTRACTION    . PROSTATE CANCER    . PROSTATE SURGERY      Medical History: Past Medical History:  Diagnosis Date  . Atrial fibrillation ([place])   . CHF (congestive heart failure) ([place])   . Diabetes ([place])   . Hearing loss   .  High blood pressure   . History of bladder problems   . Kidney stones 11/22/2014    Family History: Family History  Problem Relation Age of Onset  . Colon cancer Mother   . Diabetes Unknown   . High blood pressure Unknown   . Prostate cancer Brother   . Diabetes Brother     Social History    Socioeconomic History  . Marital status: Married    Spouse name: Not on file  . Number of children: Not on file  . Years of education: Not on file  . Highest education level: Not on file  Occupational History  . Not on file  Social Needs  . Financial resource strain: Not on file  . Food insecurity:    Worry: Not on file    Inability: Not on file  . Transportation needs:    Medical: Not on file    Non-medical: Not on file  Tobacco Use  . Smoking status: Former Smoker    Years: 16.00    Types: Cigarettes  . Smokeless tobacco: Never Used  Substance and Sexual Activity  . Alcohol use: No  . Drug use: No  . Sexual activity: Not on file  Lifestyle  . Physical activity:    Days per week: Not on file    Minutes per session: Not on file  . Stress: Not on file  Relationships  . Social connections:    Talks on phone: Not on file    Gets together: Not on file    Attends religious service: Not on file    Active member of club or organization: Not on file    Attends meetings of clubs or organizations: Not on file    Relationship status: Not on file  . Intimate partner violence:    Fear of current or ex partner: Not on file    Emotionally abused: Not on file    Physically abused: Not on file    Forced sexual activity: Not on file  Other Topics Concern  . Not on file  Social History Narrative  . Not on file      Review of Systems  Constitutional: Negative for activity change, chills, fatigue and unexpected weight change.  HENT: Negative for congestion, postnasal drip, rhinorrhea, sneezing, sore throat and voice change.        Ears feel full. Having a hard time with hearing .  Eyes: Negative.  Negative for redness.  Respiratory: Negative for cough, chest tightness, shortness of breath and wheezing.   Cardiovascular: Positive for leg swelling. Negative for chest pain and palpitations.  Gastrointestinal: Negative for abdominal pain, constipation, diarrhea, nausea and  vomiting.  Endocrine: Negative for cold intolerance, heat intolerance, polydipsia, polyphagia and polyuria.       Blood sugars much improved.  Genitourinary: Negative for dysuria and frequency.  Musculoskeletal: Positive for arthralgias. Negative for back pain, joint swelling and neck pain.  Skin: Negative for rash.       Darkened discoloration to left lower leg. Hs dry, shiny, flaky skin on this part of left lower leg.   Allergic/Immunologic: Negative for environmental allergies.  Neurological: Negative for dizziness, tremors, numbness and headaches.  Hematological: Negative for adenopathy. Does not bruise/bleed easily.  Psychiatric/Behavioral: Negative for behavioral problems (Depression), sleep disturbance and suicidal ideas. The patient is not nervous/anxious.     Today's Vitals   10/06/17 1435  BP: 118/70  Pulse: 73  Resp: 16  SpO2: 96%  Weight: 255 lb (115.7 kg)  Height:  6' (1.829 m)    Physical Exam  Constitutional: He is oriented to person, place, and time. He appears well-developed and well-nourished. No distress.  HENT:  Head: Normocephalic and atraumatic.  Nose: Nose normal.  Mouth/Throat: Oropharynx is clear and moist. No oropharyngeal exudate.  Cerumen impaction in bilateral outer ear canals  Eyes: Pupils are equal, round, and reactive to light. Conjunctivae and EOM are normal.  Neck: Normal range of motion. Neck supple. No JVD present. Carotid bruit is not present. No tracheal deviation present. No thyromegaly present.  Cardiovascular: Normal rate and normal heart sounds. An irregular rhythm present. Exam reveals no gallop and no friction rub.  No murmur heard. Pulses:      Dorsalis pedis pulses are 1+ on the right side, and 1+ on the left side.       Posterior tibial pulses are 1+ on the right side, and 1+ on the left side.  Mild pitting edema noted in left lower leg. Pedal pulse intact. Capillary refill less than 3 seconds. No warmth or heat associated with  palpation of left lower leg.   Pulmonary/Chest: Effort normal and breath sounds normal. No respiratory distress. He has no wheezes. He has no rales. He exhibits no tenderness.  Abdominal: Soft. Bowel sounds are normal. There is no tenderness.  Musculoskeletal: Normal range of motion.       Right foot: There is normal range of motion and no deformity.       Left foot: There is normal range of motion and no deformity.  Feet:  Right Foot:  Protective Sensation: 7 sites tested. 7 sites sensed.  Skin Integrity: Positive for dry skin. Negative for skin breakdown.  Left Foot:  Protective Sensation: 7 sites tested. 7 sites sensed.  Skin Integrity: Positive for dry skin. Negative for skin breakdown.  Lymphadenopathy:    He has no cervical adenopathy.  Neurological: He is alert and oriented to person, place, and time. No cranial nerve deficit or sensory deficit.  Skin: Skin is warm and dry. Capillary refill takes less than 2 seconds. He is not diaphoretic.  Hyperpigmentation along anterior surface of left lower leg. Skin is dry, flaky, and dry. No rdness or warmth present.   Psychiatric: He has a normal mood and affect. His behavior is normal. Judgment and thought content normal.  Nursing note and vitals reviewed.  Assessment/Plan:  1. Uncontrolled type 2 diabetes mellitus with hyperglycemia (HCC) - POCT hemoglobin 7.8 today. Big improvement since last check. Continue meds as prescribed and continue to monitor closely.  2. Essential hypertension Stable. Continue bp medication as prescribed.   3. Paroxysmal A-fib (HCC) Stable. Cardiology visits as scheduled.    General Counseling: macallan ord understanding of the findings of todays visit and agrees with plan of treatment. I have discussed any further diagnostic evaluation that may be needed or ordered today. We also reviewed his medications today. he has been encouraged to call the office with any questions or concerns that should arise  related to todays visit.  Diabetes Counseling:  1. Addition of ACE inh/ ARB'S for nephroprotection. Microalbumin is updated  2. Diabetic foot care, prevention of complications. Podiatry consult 3. Exercise and lose weight.  4. Diabetic eye examination, Diabetic eye exam is updated  5. Monitor blood sugar closlely. nutrition counseling.  6. Sign and symptoms of hypoglycemia including shaking sweating,confusion and headaches.   This patient was seen by Leretha Pol FNP Collaboration with Dr Lavera Guise as a part of collaborative care agreement  Orders Placed This Encounter  Procedures  . POCT hemoglobin      Time spent: 15 Minutes      Dr Lavera Guise Internal medicine

## 2017-10-07 ENCOUNTER — Encounter: Payer: Self-pay | Admitting: Nurse Practitioner

## 2017-10-07 NOTE — Progress Notes (Signed)
RESULTS FOR DIABETIC EYE EXAM FROM [facility] DONE ON 08/18/17.

## 2017-10-08 ENCOUNTER — Ambulatory Visit: Payer: Self-pay

## 2017-10-09 ENCOUNTER — Ambulatory Visit (INDEPENDENT_AMBULATORY_CARE_PROVIDER_SITE_OTHER): Payer: Medicare Other

## 2017-10-09 DIAGNOSIS — H6123 Impacted cerumen, bilateral: Secondary | ICD-10-CM | POA: Diagnosis not present

## 2017-10-16 ENCOUNTER — Other Ambulatory Visit (HOSPITAL_COMMUNITY): Payer: Self-pay

## 2017-10-16 NOTE — Progress Notes (Signed)
Bp: 118/90, pulse: 62, resp: 18, spo2: 95,  Last weight: 240 lbs todays 246 lbs bgl: 131   Visit today [name] has went back to work, working 35 hours a day.  He states he has been doing good working.  He has been gaining weight throughout the past couple of weeks.  He has plus 1 edema in extremities, lungs are clear, abdomen is not tight.  Denies a cough.  His legs have been swelling some since started working, he states goes down over night.  He states been watching what he eats, tired of chicken.  Went over some other foods he could eat.  We read some labels on foods. Pt denies shortness of breath, tightness in chest while at work.  He denies headaches or dizziness.  Meds verified and wife places them in med box.  Will continue to visit and educate on diet and heart failure.   [place] [phone]

## 2017-10-20 ENCOUNTER — Ambulatory Visit: Payer: Medicare Other | Admitting: Cardiovascular Disease

## 2017-10-20 DIAGNOSIS — G4733 Obstructive sleep apnea (adult) (pediatric): Secondary | ICD-10-CM | POA: Diagnosis not present

## 2017-11-05 ENCOUNTER — Other Ambulatory Visit: Payer: Self-pay | Admitting: Cardiovascular Disease

## 2017-11-12 ENCOUNTER — Other Ambulatory Visit (HOSPITAL_COMMUNITY): Payer: Self-pay

## 2017-11-12 ENCOUNTER — Encounter (HOSPITAL_COMMUNITY): Payer: Self-pay

## 2017-11-12 NOTE — Progress Notes (Signed)
Bp: 112/76, pulse: 64, resp: 18, spo2: 97, last weight: 243 lbs, todays: 240 lbs bgl: 123  Today Buford states been doing good.  He is back at work full time.  He states he can sit when he wants.  Swelling is better, has plus 1 edema in legs, he states goes down at night.  His weight has maintaining last couple of weeks.  He weighs everyday and writes it down. He states eating better and watches his fluids.  He states been feeling good, no chest pain or pressure.  Denies headaches or dizziness.  Compliance with meds.  Meds verified.  He was going to switch cardiologist but for now he is staying with his.  Lungs are clear.  Will visit monthly, but did advise if his weight creeps up or has any problems to call me.  He works 40 hours a week.    [place] [phone]

## 2017-11-19 DIAGNOSIS — G4733 Obstructive sleep apnea (adult) (pediatric): Secondary | ICD-10-CM | POA: Diagnosis not present

## 2017-11-21 NOTE — Progress Notes (Signed)
Patient ID: [name], male    DOB: 1933-02-06, 84 y.o.   MRN: [id number]  HPI  [name] is a 84 y/o male with a history of DM, HTN, CKD, atrial fibrillation, previous tobacco use and chronic heart failure.   Echo report from 08/02/17 reviewed and showed an EF of 35-40% along with mild Glenn.   Was in the ED 08/27/17 due to feeling like something was stuck in his throat. Was in the ED 08/09/17 due to headache. Head CT was negative. Given IV fluids with resolution of headache and he was released. Admitted 08/07/17 due to AF with RVR and chest pain. Troponins negative X3. Cardiology consult obtained. Discharged the following day after medication changes. Was in the ED 08/06/17 due to AF with RVR. Given IV lopressor and small bolus of IV fluids. Troponin negative and he was released. Admitted 08/01/17 due to acute on chronic HF. Cardiology consult obtained. Medications adjusted. Elevated troponin thought to be due to demand ischemia. Discharged after 3 days.   He presents today for a follow-up visit with a chief complaint of minimal shortness of breath upon moderate exertion. He describes this as chronic in nature having been present for several years. He has associated fatigue, cough and slight weight gain along with this. He denies any difficulty sleeping, abdominal distention, palpitations, pedal edema, chest pain or dizziness.   Past Medical History:  Diagnosis Date  . Atrial fibrillation ([place])   . CHF (congestive heart failure) ([place])   . Diabetes ([place])   . Hearing loss   . High blood pressure   . History of bladder problems   . Kidney stones 11/22/2014   Past Surgical History:  Procedure Laterality Date  . CATARACT EXTRACTION    . PROSTATE CANCER    . PROSTATE SURGERY     Family History  Problem Relation Age of Onset  . Colon cancer Mother   . Diabetes Unknown   . High blood pressure Unknown   . Prostate cancer Brother   . Diabetes Brother    Social History   Tobacco Use  .  Smoking status: Former Smoker    Years: 16.00    Types: Cigarettes  . Smokeless tobacco: Never Used  Substance Use Topics  . Alcohol use: No   No Known Allergies  Prior to Admission medications   Medication Sig Start Date End Date Taking? Authorizing Provider  atorvastatin (LIPITOR) 40 MG tablet Take 1 tablet (40 mg total) by mouth daily at 6 PM. 10/02/17  Yes Werner, Glenn E, NP  carvedilol (COREG) 25 MG tablet Take 25 mg by mouth 2 (two) times daily with a meal.   Yes [provider]  furosemide (LASIX) 20 MG tablet Take 1 tablet (20 mg total) by mouth daily. 10/02/17  Yes [name] November, MD  glipiZIDE (GLUCOTROL XL) 2.5 MG 24 hr tablet TAKE 1 TABLET(2.5 MG) BY MOUTH DAILY WITH BREAKFAST 09/04/17  Yes Werner, Glenn Clear, NP  nitroGLYCERIN (NITROSTAT) 0.4 MG SL tablet Place 1 tablet (0.4 mg total) under the tongue every 5 (five) minutes as needed for chest pain. 05/05/16  Yes Glenn Grist, MD  ONETOUCH VERIO test strip USE UTD TO CHECK BLOOD SUGAR ONCE D 02/03/17  Yes Werner, Glenn E, NP  pantoprazole (PROTONIX) 40 MG tablet TAKE 1 TABLET(40 MG) BY MOUTH DAILY 09/04/17  Yes Werner, Glenn Clear, NP  PRADAXA 150 MG CAPS capsule Take 150 mg by mouth 2 (two) times daily. 04/05/16  Yes [provider]  Review of Systems  Constitutional: Positive for fatigue. Negative for appetite change.  HENT: Negative for congestion, postnasal drip and sore throat.   Eyes: Negative.   Respiratory: Positive for cough (dry cough) and shortness of breath. Negative for chest tightness.   Cardiovascular: Negative for chest pain, palpitations and leg swelling.  Gastrointestinal: Negative for abdominal distention and abdominal pain.  Endocrine: Negative.   Genitourinary: Negative.   Musculoskeletal: Positive for neck pain (knot for many years). Negative for back pain.  Skin: Negative.   Allergic/Immunologic: Negative.   Neurological: Negative for dizziness and light-headedness.   Hematological: Negative for adenopathy. Does not bruise/bleed easily.  Psychiatric/Behavioral: Negative for dysphoric mood and sleep disturbance. The patient is not nervous/anxious.    There were no vitals filed for this visit. Wt Readings from Last 3 Encounters:  11/12/17 240 lb (108.9 kg)  10/16/17 246 lb (111.6 kg)  10/06/17 255 lb (115.7 kg)   Lab Results  Component Value Date   CREATININE 2.84 (H) 08/28/2017   CREATININE 3.19 (H) 08/22/2017   CREATININE 3.55 (H) 08/19/2017    Physical Exam  Constitutional: He is oriented to person, place, and time. He appears well-developed and well-nourished.  HENT:  Head: Normocephalic and atraumatic.  Neck: Normal range of motion. Neck supple. No JVD present.  Cardiovascular: An irregular rhythm present. Bradycardia present.  Pulmonary/Chest: Effort normal. No respiratory distress. He has no wheezes. He has no rales.  Abdominal: Soft. He exhibits no distension.  Musculoskeletal:       Right lower leg: He exhibits no tenderness and no edema.       Left lower leg: He exhibits no tenderness and no edema.  Neurological: He is alert and oriented to person, place, and time.  Skin: Skin is warm and dry.  Psychiatric: He has a normal mood and affect. His behavior is normal.  Nursing note and vitals reviewed.  Assessment & Plan:  1: Chronic heart failure with reduced ejection fraction- - NYHA class III - euvolemic today - weighing daily and he was reminded to call for an overnight weight gain of >2 pounds or a weekly weight gain of >5 pounds - weight up 3 pounds since last visit 2 months ago - not adding salt and his wife has been reading food labels. Reviewed the importance of closely following a 2000mg  sodium diet  - saw cardiology [name]) 09/09/17 - wearing compression socks daily - drinking ~ 40+ ounces of fluids daily - wearing oxygen at 2L at bedtime - BNP 08/06/17 was 149.0 - PharmD reconciled medications with patient and his  wife - currently participating in paramedicine program  - patient reports receiving his flu vaccine for this season  2: HTN- - BP looks good today - saw PCP [name]) 08/15/17 - BMP 08/28/17 reviewed and showed sodium 139, potassium 4.8, creatinine 2.84 and GFR 22  3: DM- - glucose at home was 129 - A1c 07/01/17 was 6.9%  4: CKD- - will get BMP today since diuretic changed at last visit  Medication list was reviewed and it was emphasized that he bring all the bottles to every visit every time to ensure accuracy.  Return in 3 months or sooner for any questions/problems before then.

## 2017-11-24 ENCOUNTER — Other Ambulatory Visit (HOSPITAL_COMMUNITY): Payer: Self-pay

## 2017-11-24 ENCOUNTER — Ambulatory Visit: Payer: Medicare Other | Attending: Family | Admitting: Family

## 2017-11-24 ENCOUNTER — Other Ambulatory Visit: Payer: Self-pay

## 2017-11-24 ENCOUNTER — Encounter: Payer: Self-pay | Admitting: Family

## 2017-11-24 VITALS — BP 118/70 | HR 51 | Temp 97.6°F | Resp 20 | Wt 254.4 lb

## 2017-11-24 DIAGNOSIS — I4891 Unspecified atrial fibrillation: Secondary | ICD-10-CM | POA: Insufficient documentation

## 2017-11-24 DIAGNOSIS — Z87891 Personal history of nicotine dependence: Secondary | ICD-10-CM | POA: Diagnosis not present

## 2017-11-24 DIAGNOSIS — Z7901 Long term (current) use of anticoagulants: Secondary | ICD-10-CM | POA: Diagnosis not present

## 2017-11-24 DIAGNOSIS — Z8546 Personal history of malignant neoplasm of prostate: Secondary | ICD-10-CM | POA: Insufficient documentation

## 2017-11-24 DIAGNOSIS — I10 Essential (primary) hypertension: Secondary | ICD-10-CM

## 2017-11-24 DIAGNOSIS — Z79899 Other long term (current) drug therapy: Secondary | ICD-10-CM | POA: Diagnosis not present

## 2017-11-24 DIAGNOSIS — E1122 Type 2 diabetes mellitus with diabetic chronic kidney disease: Secondary | ICD-10-CM | POA: Diagnosis not present

## 2017-11-24 DIAGNOSIS — Z7984 Long term (current) use of oral hypoglycemic drugs: Secondary | ICD-10-CM | POA: Insufficient documentation

## 2017-11-24 DIAGNOSIS — N189 Chronic kidney disease, unspecified: Secondary | ICD-10-CM | POA: Diagnosis not present

## 2017-11-24 DIAGNOSIS — N183 Chronic kidney disease, stage 3 unspecified: Secondary | ICD-10-CM

## 2017-11-24 DIAGNOSIS — I130 Hypertensive heart and chronic kidney disease with heart failure and stage 1 through stage 4 chronic kidney disease, or unspecified chronic kidney disease: Secondary | ICD-10-CM | POA: Diagnosis not present

## 2017-11-24 DIAGNOSIS — I5022 Chronic systolic (congestive) heart failure: Secondary | ICD-10-CM

## 2017-11-24 NOTE — Patient Instructions (Signed)
Continue weighing daily and call for an overnight weight gain of > 2 pounds or a weekly weight gain of >5 pounds. 

## 2017-11-24 NOTE — Progress Notes (Signed)
Today visit was at [facility] office.  He states been doing good, back at work full time.  He weighs daily.  Takes his meds daily, compliance with his meds.  Lungs are clear, wearing stocking during day now.  Swelling down in lower extremities.  He denies chest pain or shortness of breath.  Denies dizziness or headaches.  Between him and his wife they watch what he eats.  Taught his wife how to fill his med box and I have been checking to make sure filled right.  She has been letting him fill it past couple of weeks and he is aware of what he needs to take, when and why.  He advised needs weigh sheets, will take some to him tomrrow.  Discussed with Otila Kluver about switching him back to [facility] visits to keep an eye on him due to he has been doing well and back working now.  She agreed.  Will watch him throughout the holidays coming up. Will continue to educate on diet and heart failure. Did advise him and wife to call if need me sooner, if sick, short of breath or weight gain.   [place] [phone]

## 2017-12-03 DIAGNOSIS — I482 Chronic atrial fibrillation, unspecified: Secondary | ICD-10-CM | POA: Diagnosis not present

## 2017-12-03 DIAGNOSIS — E782 Mixed hyperlipidemia: Secondary | ICD-10-CM | POA: Diagnosis not present

## 2017-12-03 DIAGNOSIS — I10 Essential (primary) hypertension: Secondary | ICD-10-CM | POA: Diagnosis not present

## 2017-12-03 DIAGNOSIS — I5022 Chronic systolic (congestive) heart failure: Secondary | ICD-10-CM | POA: Diagnosis not present

## 2017-12-15 ENCOUNTER — Other Ambulatory Visit (INDEPENDENT_AMBULATORY_CARE_PROVIDER_SITE_OTHER): Payer: Self-pay | Admitting: Vascular Surgery

## 2017-12-15 DIAGNOSIS — I714 Abdominal aortic aneurysm, without rupture, unspecified: Secondary | ICD-10-CM

## 2017-12-16 ENCOUNTER — Encounter

## 2017-12-16 ENCOUNTER — Ambulatory Visit (INDEPENDENT_AMBULATORY_CARE_PROVIDER_SITE_OTHER): Payer: Medicare Other | Admitting: Vascular Surgery

## 2017-12-16 ENCOUNTER — Encounter (INDEPENDENT_AMBULATORY_CARE_PROVIDER_SITE_OTHER): Payer: Self-pay | Admitting: Vascular Surgery

## 2017-12-16 ENCOUNTER — Ambulatory Visit (INDEPENDENT_AMBULATORY_CARE_PROVIDER_SITE_OTHER): Payer: Medicare Other

## 2017-12-16 VITALS — BP 117/84 | HR 68 | Resp 16 | Ht 72.0 in | Wt 255.0 lb

## 2017-12-16 DIAGNOSIS — I10 Essential (primary) hypertension: Secondary | ICD-10-CM

## 2017-12-16 DIAGNOSIS — I714 Abdominal aortic aneurysm, without rupture, unspecified: Secondary | ICD-10-CM

## 2017-12-16 DIAGNOSIS — E782 Mixed hyperlipidemia: Secondary | ICD-10-CM | POA: Diagnosis not present

## 2017-12-16 DIAGNOSIS — K551 Chronic vascular disorders of intestine: Secondary | ICD-10-CM

## 2017-12-16 DIAGNOSIS — E1165 Type 2 diabetes mellitus with hyperglycemia: Secondary | ICD-10-CM

## 2017-12-16 DIAGNOSIS — Z87891 Personal history of nicotine dependence: Secondary | ICD-10-CM

## 2017-12-16 NOTE — Progress Notes (Signed)
Subjective:    Patient ID: [name], male    DOB: 1933-03-19, 84 y.o.   MRN: [id number] Chief Complaint  Patient presents with  . Follow-up    ultrasound follow up   Patient presents for a yearly abdominal aortic aneurysm follow-up.  The patient presents today without complaint.  Patient denies any abdominal pain, pulses to the bilateral extremities, postprandial pain, nausea vomiting.  Patient denies any changes in his bowel habits.  Patient was recently changed from [place] to Eliquis by his cardiologist.  No issues with his anticoagulant.  The patient underwent a abdominal aortic duplex which was notable for enlargement of his aortic aneurysm now measuring 4.4 cm.  Previous diameter measurement noted on August 09, 2016 was 3.1 cm.  The dental finding of elevated velocities in the celiac trunk and SMA were noted.  Patient denies any claudication-like symptoms, rest pain or ulcer formation to the bilateral lower extremity.  Review of Systems  Constitutional: Negative.   HENT: Negative.   Eyes: Negative.   Respiratory: Negative.   Cardiovascular: Negative.   Gastrointestinal: Negative.   Endocrine: Negative.   Genitourinary: Negative.   Musculoskeletal: Negative.   Skin: Negative.   Allergic/Immunologic: Negative.   Neurological: Negative.   Hematological: Negative.   Psychiatric/Behavioral: Negative.       Objective:   Physical Exam  Constitutional: He is oriented to person, place, and time. He appears well-developed and well-nourished. No distress.  HENT:  Head: Normocephalic and atraumatic.  Right Ear: External ear normal.  Left Ear: External ear normal.  Eyes: Pupils are equal, round, and reactive to light. Conjunctivae and EOM are normal.  Neck: Normal range of motion.  Cardiovascular: Normal rate, regular rhythm, normal heart sounds and intact distal pulses.  Pulses:      Radial pulses are 2+ on the right side, and 2+ on the left side.  Hard to palpate pedal  pulses however the bilateral feet are warm.  There is a good capillary refill.  Pulmonary/Chest: Effort normal and breath sounds normal.  Abdominal: Soft. Bowel sounds are normal. He exhibits no distension. There is no tenderness. There is no guarding.  Musculoskeletal: Normal range of motion. He exhibits edema (Mild nonpitting edema noted bilaterally).  Neurological: He is alert and oriented to person, place, and time.  Skin: Skin is warm and dry. He is not diaphoretic.  Psychiatric: He has a normal mood and affect. His behavior is normal. Judgment and thought content normal.  Vitals reviewed.  BP 117/84 (BP Location: Right Arm)   Pulse 68   Resp 16   Ht 6' (1.829 m)   Wt 255 lb (115.7 kg)   BMI 34.58 kg/m   Past Medical History:  Diagnosis Date  . Atrial fibrillation ([place])   . CHF (congestive heart failure) ([place])   . Diabetes ([place])   . Hearing loss   . High blood pressure   . History of bladder problems   . Kidney stones 11/22/2014   Social History   Socioeconomic History  . Marital status: Married    Spouse name: Not on file  . Number of children: Not on file  . Years of education: Not on file  . Highest education level: Not on file  Occupational History  . Occupation: retired  Scientific laboratory technician  . Financial resource strain: Not hard at all  . Food insecurity:    Worry: Never true    Inability: Never true  . Transportation needs:    Medical: No  Non-medical: No  Tobacco Use  . Smoking status: Former Smoker    Years: 16.00    Types: Cigarettes  . Smokeless tobacco: Never Used  Substance and Sexual Activity  . Alcohol use: No  . Drug use: No  . Sexual activity: Not Currently  Lifestyle  . Physical activity:    Days per week: 5 days    Minutes per session: 150+ min  . Stress: Only a little  Relationships  . Social connections:    Talks on phone: Twice a week    Gets together: Twice a week    Attends religious service: 1 to 4 times per year    Active member  of club or organization: No    Attends meetings of clubs or organizations: Never    Relationship status: Married  . Intimate partner violence:    Fear of current or ex partner: No    Emotionally abused: No    Physically abused: No    Forced sexual activity: No  Other Topics Concern  . Not on file  Social History Narrative  . Not on file   Past Surgical History:  Procedure Laterality Date  . CATARACT EXTRACTION    . PROSTATE CANCER    . PROSTATE SURGERY     Family History  Problem Relation Age of Onset  . Colon cancer Mother   . Diabetes Unknown   . High blood pressure Unknown   . Prostate cancer Brother   . Diabetes Brother    No Known Allergies     Assessment & Plan:  Patient presents for a yearly abdominal aortic aneurysm follow-up.  The patient presents today without complaint.  Patient denies any abdominal pain, pulses to the bilateral extremities, postprandial pain, nausea vomiting.  Patient denies any changes in his bowel habits.  Patient was recently changed from [place] to Eliquis by his cardiologist.  No issues with his anticoagulant.  The patient underwent a abdominal aortic duplex which was notable for enlargement of his aortic aneurysm now measuring 4.4 cm.  Previous diameter measurement noted on August 09, 2016 was 3.1 cm.  The dental finding of elevated velocities in the celiac trunk and SMA were noted.  Patient denies any claudication-like symptoms, rest pain or ulcer formation to the bilateral lower extremity.  1. AAA (abdominal aortic aneurysm) without rupture (HCC) - Stable Patient with an increase in aneurysmal sac size to 4.4cm for 3.1cm is a year and a half ago. The patient's blood pressure is being adequately controlled however I have reviewed the importance of hypertension and lipid control and the importance of continuing his abstinence from tobacco.  The patient is also encouraged to exercise a minimum of 30 minutes 4 times a week.  Should the patient  develop new onset abdominal or back pain or signs of peripheral embolization they are instructed to seek medical attention immediately and to alert the physician providing care that they have an aneurysm.  The patient voices their understanding.  - VAS [place] AAA DUPLEX; Future  2. Chronic mesenteric ischemia [facility]) - New Patient is asymptomatic at this time. Will repeat a mesenteric duplex when the patient returns in 6 months I have discussed with the patient at length the risk factors for and pathogenesis of atherosclerotic disease and encouraged a healthy diet, regular exercise regimen and blood pressure / glucose control. The patient was encouraged to call the office in the interim if he experiences post prandial pain, weight loss, fear of eating, nausea, vomiting, constipation or diarrhea.  The patient expresses their understanding.  - VAS [place] MESENTERIC DUPLEX; Future  3. Uncontrolled type 2 diabetes mellitus with hyperglycemia (HCC) - Stable On appropriate medications Encouraged good control as its slows the progression of atherosclerotic disease  4. Hyperlipidemia, mixed - Stable On appropriate medications Encouraged good control as its slows the progression of atherosclerotic disease  5. Essential hypertension - Stable Encouraged good control as its slows the progression of atherosclerotic and aneurysmal disease  Current Outpatient Medications on File Prior to Visit  Medication Sig Dispense Refill  . atorvastatin (LIPITOR) 40 MG tablet Take 1 tablet (40 mg total) by mouth daily at 6 PM. 30 tablet 5  . carvedilol (COREG) 25 MG tablet Take 25 mg by mouth 2 (two) times daily with a meal.    . ELIQUIS 5 MG TABS tablet   11  . furosemide (LASIX) 20 MG tablet Take 1 tablet (20 mg total) by mouth daily. 30 tablet 0  . glipiZIDE (GLUCOTROL XL) 2.5 MG 24 hr tablet TAKE 1 TABLET(2.5 MG) BY MOUTH DAILY WITH BREAKFAST 90 tablet 0  . nitroGLYCERIN (NITROSTAT) 0.4 MG SL tablet Place 1 tablet (0.4  mg total) under the tongue every 5 (five) minutes as needed for chest pain. 30 tablet 12  . ONETOUCH VERIO test strip USE UTD TO CHECK BLOOD SUGAR ONCE D 100 each 5  . pantoprazole (PROTONIX) 40 MG tablet TAKE 1 TABLET(40 MG) BY MOUTH DAILY 90 tablet 0  . PRADAXA 150 MG CAPS capsule Take 150 mg by mouth 2 (two) times daily.  4   No current facility-administered medications on file prior to visit.    There are no Patient Instructions on file for this visit. No follow-ups on file.  Thessaly Mccullers A Lucifer Soja, PA-C

## 2017-12-20 DIAGNOSIS — G4733 Obstructive sleep apnea (adult) (pediatric): Secondary | ICD-10-CM | POA: Diagnosis not present

## 2017-12-29 ENCOUNTER — Other Ambulatory Visit (HOSPITAL_COMMUNITY): Payer: Self-pay

## 2017-12-29 ENCOUNTER — Encounter (HOSPITAL_COMMUNITY): Payer: Self-pay

## 2017-12-29 NOTE — Progress Notes (Signed)
Bgl: 139 bp: 142/84, pulse: 52, resp: 18, spo2: 98%  Today visit he states been doing good.  He has some swelling, leaving sock lines.  He states work is good.  He states did good during Thanksgiving, kept his sugar down and weight.  Lungs clear, denies chest pain, shortness of breath, headaches or dizziness.  Discussed diet and foods with him and wife. He states works 5 days a week.  Meds verified and he has been refilling his med box.  He is out of refills for furosemide, will contact [facility] for refills. Will continue to visit monthly for compliance and advised him of weight gain of 3 lbs overnight or 5 lbs in a week to contact me.   [place] [phone]

## 2017-12-31 ENCOUNTER — Other Ambulatory Visit: Payer: Self-pay | Admitting: Family

## 2017-12-31 MED ORDER — FUROSEMIDE 20 MG PO TABS: 20.0000 mg | ORAL_TABLET | Freq: Every day | ORAL | 3 refills | Status: DC

## 2018-01-06 ENCOUNTER — Ambulatory Visit: Payer: Self-pay | Admitting: Nurse Practitioner

## 2018-01-08 ENCOUNTER — Ambulatory Visit: Payer: Medicare Other | Admitting: Podiatry

## 2018-01-08 ENCOUNTER — Ambulatory Visit: Payer: Medicare Other | Admitting: Nurse Practitioner

## 2018-01-08 ENCOUNTER — Encounter: Payer: Self-pay | Admitting: Podiatry

## 2018-01-08 ENCOUNTER — Encounter: Payer: Self-pay | Admitting: Nurse Practitioner

## 2018-01-08 VITALS — BP 121/69 | HR 68 | Resp 16 | Ht 72.0 in | Wt 257.6 lb

## 2018-01-08 DIAGNOSIS — E119 Type 2 diabetes mellitus without complications: Secondary | ICD-10-CM

## 2018-01-08 DIAGNOSIS — N183 Chronic kidney disease, stage 3 unspecified: Secondary | ICD-10-CM

## 2018-01-08 DIAGNOSIS — I10 Essential (primary) hypertension: Secondary | ICD-10-CM | POA: Diagnosis not present

## 2018-01-08 DIAGNOSIS — E1165 Type 2 diabetes mellitus with hyperglycemia: Secondary | ICD-10-CM | POA: Diagnosis not present

## 2018-01-08 DIAGNOSIS — I480 Paroxysmal atrial fibrillation: Secondary | ICD-10-CM

## 2018-01-08 DIAGNOSIS — E1122 Type 2 diabetes mellitus with diabetic chronic kidney disease: Secondary | ICD-10-CM | POA: Diagnosis not present

## 2018-01-08 DIAGNOSIS — B351 Tinea unguium: Secondary | ICD-10-CM

## 2018-01-08 DIAGNOSIS — M79676 Pain in unspecified toe(s): Secondary | ICD-10-CM | POA: Diagnosis not present

## 2018-01-08 LAB — POCT GLYCOSYLATED HEMOGLOBIN (HGB A1C): Hemoglobin A1C: 7.4 % — AB (ref 4.0–5.6)

## 2018-01-08 MED ORDER — GLIPIZIDE ER 5 MG PO TB24: 5.0000 mg | ORAL_TABLET | Freq: Every day | ORAL | 3 refills | Status: DC

## 2018-01-08 NOTE — Progress Notes (Signed)
Complaint:  Visit Type: Patient returns to my office for continued preventative foot care services. Complaint: Patient states" my nails have grown long and thick and become painful to walk and wear shoes" Patient has been diagnosed with DM with no foot complications. The patient presents for preventative foot care services. No changes to ROS.    Podiatric Exam: Vascular: dorsalis pedis and posterior tibial pulses are palpable bilateral. Capillary return is immediate. Temperature gradient is WNL. Skin turgor WNL  Sensorium: Normal Semmes Weinstein monofilament test. Normal tactile sensation bilaterally. Nail Exam: Pt has thick disfigured discolored nails with subungual debris noted bilateral entire nail hallux through fifth toenails Ulcer Exam: There is no evidence of ulcer or pre-ulcerative changes or infection. Orthopedic Exam: Muscle tone and strength are WNL. No limitations in general ROM. No crepitus or effusions noted. Foot type and digits show no abnormalities. Bony prominences are unremarkable. Skin:  Porokeratosis sub 3 right asymptomatic.Marland Kitchen  No infection noted. No infection or ulcers  Diagnosis:  Onychomycosis, , Pain in right toe, pain in left toes  Porokeratosis  Treatment & Plan Procedures and Treatment: Consent by patient was obtained for treatment procedures. The patient understood the discussion of treatment and procedures well. All questions were answered thoroughly reviewed. Debridement of mycotic and hypertrophic toenails, 1 through 5 bilateral and clearing of subungual debris. No ulceration, no infection noted.     Return Visit-Office Procedure: Patient instructed to return to the office for a follow up visit 3 months for continued evaluation and treatment.    Gardiner Barefoot DPM

## 2018-01-08 NOTE — Progress Notes (Signed)
[facility] [place], [place] [address]  Internal MEDICINE  Office Visit Note  Patient Name: [name]  [id number]  [id number]  Date of Service: 01/08/2018  Chief Complaint  Patient presents with  . Medical Management of Chronic Issues    3 month follow up  . Diabetes  . Quality Metric Gaps    pt has not had a pneumonia vaccine    The patient is here for routine follow up visit. Blood sugars have been elevated recently. Running in the high 100s. States that he is not consuming increased carbohydrates or sugars. Just can't seem to get sugars down to where they need to be.  Dr. [name], patient's cardiologist, changed pradaxa to eliquis. He felt that the pradaxa was "messing" with his kidneys and would like to have the kidneys checked out more carefully. BUN and creatinine levels have been gradually increasing over the past year.       Current Medication: Outpatient Encounter Medications as of 01/08/2018  Medication Sig  . apixaban (ELIQUIS) 5 MG TABS tablet Take 1 tablet by mouth 2 (two) times daily.  Marland Kitchen atorvastatin (LIPITOR) 40 MG tablet Take 1 tablet (40 mg total) by mouth daily at 6 PM.  . carvedilol (COREG) 25 MG tablet Take 25 mg by mouth 2 (two) times daily with a meal.  . ELIQUIS 5 MG TABS tablet   . furosemide (LASIX) 20 MG tablet Take 1 tablet (20 mg total) by mouth daily.  Marland Kitchen glipiZIDE (GLUCOTROL XL) 5 MG 24 hr tablet Take 1 tablet (5 mg total) by mouth daily with breakfast.  . ONETOUCH VERIO test strip USE UTD TO CHECK BLOOD SUGAR ONCE D  . pantoprazole (PROTONIX) 40 MG tablet TAKE 1 TABLET(40 MG) BY MOUTH DAILY  . [DISCONTINUED] glipiZIDE (GLUCOTROL XL) 2.5 MG 24 hr tablet TAKE 1 TABLET(2.5 MG) BY MOUTH DAILY WITH BREAKFAST  . nitroGLYCERIN (NITROSTAT) 0.4 MG SL tablet Place 1 tablet (0.4 mg total) under the tongue every 5 (five) minutes as needed for chest pain. (Patient not taking: Reported on 12/29/2017)  . PRADAXA 150 MG CAPS  capsule Take 150 mg by mouth 2 (two) times daily.   No facility-administered encounter medications on file as of 01/08/2018.     Surgical History: Past Surgical History:  Procedure Laterality Date  . CATARACT EXTRACTION    . PROSTATE CANCER    . PROSTATE SURGERY      Medical History: Past Medical History:  Diagnosis Date  . Atrial fibrillation ([place])   . CHF (congestive heart failure) ([place])   . Diabetes ([place])   . Hearing loss   . High blood pressure   . History of bladder problems   . Kidney stones 11/22/2014    Family History: Family History  Problem Relation Age of Onset  . Colon cancer Mother   . Diabetes Other   . High blood pressure Other   . Prostate cancer Brother   . Diabetes Brother     Social History   Socioeconomic History  . Marital status: Married    Spouse name: Not on file  . Number of children: Not on file  . Years of education: Not on file  . Highest education level: Not on file  Occupational History  . Occupation: retired  Scientific laboratory technician  . Financial resource strain: Not hard at all  . Food insecurity:    Worry: Never true    Inability: Never true  . Transportation needs:    Medical: No  Non-medical: No  Tobacco Use  . Smoking status: Former Smoker    Years: 16.00    Types: Cigarettes  . Smokeless tobacco: Never Used  Substance and Sexual Activity  . Alcohol use: No  . Drug use: No  . Sexual activity: Not Currently  Lifestyle  . Physical activity:    Days per week: 5 days    Minutes per session: 150+ min  . Stress: Only a little  Relationships  . Social connections:    Talks on phone: Twice a week    Gets together: Twice a week    Attends religious service: 1 to 4 times per year    Active member of club or organization: No    Attends meetings of clubs or organizations: Never    Relationship status: Married  . Intimate partner violence:    Fear of current or ex partner: No    Emotionally abused: No    Physically abused: No     Forced sexual activity: No  Other Topics Concern  . Not on file  Social History Narrative  . Not on file      Review of Systems  Constitutional: Negative for activity change, chills, fatigue and unexpected weight change.  HENT: Negative for congestion, postnasal drip, rhinorrhea, sneezing, sore throat and voice change.        Ears feel full. Having a hard time with hearing .  Eyes: Negative.   Respiratory: Negative for cough, chest tightness, shortness of breath and wheezing.   Cardiovascular: Negative for chest pain, palpitations and leg swelling.  Gastrointestinal: Negative for abdominal pain, constipation, diarrhea, nausea and vomiting.  Endocrine: Negative for cold intolerance, heat intolerance, polydipsia and polyuria.       Elevated blood sugars.   Genitourinary: Negative for dysuria and frequency.  Musculoskeletal: Positive for arthralgias. Negative for back pain, joint swelling and neck pain.  Skin: Negative for rash.  Allergic/Immunologic: Negative for environmental allergies.  Neurological: Negative for dizziness, tremors, numbness and headaches.  Hematological: Negative for adenopathy. Does not bruise/bleed easily.  Psychiatric/Behavioral: Negative for behavioral problems (Depression), sleep disturbance and suicidal ideas. The patient is not nervous/anxious.     Vital Signs: BP 121/69 (BP Location: Right Arm, Patient Position: Sitting, Cuff Size: Large)   Pulse 68   Resp 16   Ht 6' (1.829 m)   Wt 257 lb 9.6 oz (116.8 kg)   SpO2 98%   BMI 34.94 kg/m    Physical Exam Vitals signs and nursing note reviewed.  Constitutional:      General: He is not in acute distress.    Appearance: Normal appearance. He is well-developed. He is not diaphoretic.  HENT:     Head: Normocephalic and atraumatic.     Nose: Nose normal.     Mouth/Throat:     Pharynx: No oropharyngeal exudate.  Eyes:     Conjunctiva/sclera: Conjunctivae normal.     Pupils: Pupils are equal,  round, and reactive to light.  Neck:     Musculoskeletal: Normal range of motion and neck supple.     Thyroid: No thyromegaly.     Vascular: No carotid bruit or JVD.     Trachea: No tracheal deviation.  Cardiovascular:     Rate and Rhythm: Normal rate. Rhythm irregular.     Pulses:          Dorsalis pedis pulses are 1+ on the right side and 1+ on the left side.       Posterior tibial pulses are 1+  on the right side and 1+ on the left side.     Heart sounds: Normal heart sounds. No murmur. No friction rub. No gallop.      Comments: Irregular heart rhythm with soft, blowing murmur. Pulmonary:     Effort: Pulmonary effort is normal. No respiratory distress.     Breath sounds: Normal breath sounds. No wheezing or rales.     Comments: Congestion auscultated in bilateral lung gases.  Chest:     Chest wall: No tenderness.  Abdominal:     General: Bowel sounds are normal.     Palpations: Abdomen is soft.     Tenderness: There is no abdominal tenderness.  Musculoskeletal: Normal range of motion.     Right foot: Normal range of motion. No deformity.     Left foot: Normal range of motion. No deformity.  Feet:     Right foot:     Protective Sensation: 7 sites tested. 7 sites sensed.     Skin integrity: Dry skin present. No skin breakdown.     Left foot:     Protective Sensation: 7 sites tested. 7 sites sensed.     Skin integrity: Dry skin present. No skin breakdown.  Lymphadenopathy:     Cervical: No cervical adenopathy.  Skin:    General: Skin is warm and dry.     Capillary Refill: Capillary refill takes less than 2 seconds.  Neurological:     Mental Status: He is alert and oriented to person, place, and time. Mental status is at baseline.     Cranial Nerves: No cranial nerve deficit.     Sensory: No sensory deficit.  Psychiatric:        Mood and Affect: Mood normal.        Behavior: Behavior normal.        Thought Content: Thought content normal.        Judgment: Judgment normal.    Assessment/Plan: 1. Type 2 diabetes mellitus with stage 3 chronic kidney disease, without long-term current use of insulin (HCC) - POCT HgB A1C 7.4 today. Increase glipizide to 5mg  daily. Limit carbohydrates and sweets in the diet. Renal ultrasound ordered for further evaluation of elevated renal functions. Recheck BMP. Refer to nephrology as indicated.  - US Renal; Future - Basic Metabolic Panel (BMET) - glipiZIDE (GLUCOTROL XL) 5 MG 24 hr tablet; Take 1 tablet (5 mg total) by mouth daily with breakfast.  Dispense: 30 tablet; Refill: 3  2. Essential hypertension Stable. Continue bp medication as prescribed .  3. Paroxysmal A-fib (HCC) Continue eliquis as prescribed. Regular visits with cardiology as scheduled.      General Counseling: akshath mccarey understanding of the findings of todays visit and agrees with plan of treatment. I have discussed any further diagnostic evaluation that may be needed or ordered today. We also reviewed his medications today. he has been encouraged to call the office with any questions or concerns that should arise related to todays visit.  Diabetes Counseling:  1. Addition of ACE inh/ ARB'S for nephroprotection. Microalbumin is updated  2. Diabetic foot care, prevention of complications. Podiatry consult 3. Exercise and lose weight.  4. Diabetic eye examination, Diabetic eye exam is updated  5. Monitor blood sugar closlely. nutrition counseling.  6. Sign and symptoms of hypoglycemia including shaking sweating,confusion and headaches.   This patient was seen by Leretha Pol FNP Collaboration with Dr Lavera Guise as a part of collaborative care agreement  Orders Placed This Encounter  Procedures  .  US Renal  . Basic Metabolic Panel (BMET)  . POCT HgB A1C    Meds ordered this encounter  Medications  . glipiZIDE (GLUCOTROL XL) 5 MG 24 hr tablet    Sig: Take 1 tablet (5 mg total) by mouth daily with breakfast.    Dispense:  30 tablet     Refill:  3    **Patient requests 90 days supply**    Order Specific Question:   Supervising Provider    Answer:   Lavera Guise [[id number]]    Time spent: 65 Minutes      Dr Lavera Guise Internal medicine

## 2018-01-19 DIAGNOSIS — G4733 Obstructive sleep apnea (adult) (pediatric): Secondary | ICD-10-CM | POA: Diagnosis not present

## 2018-01-23 ENCOUNTER — Ambulatory Visit: Payer: Medicare Other

## 2018-01-23 DIAGNOSIS — E1122 Type 2 diabetes mellitus with diabetic chronic kidney disease: Secondary | ICD-10-CM | POA: Diagnosis not present

## 2018-01-23 DIAGNOSIS — N183 Chronic kidney disease, stage 3 (moderate): Secondary | ICD-10-CM

## 2018-01-24 ENCOUNTER — Other Ambulatory Visit: Payer: Self-pay | Admitting: Adult Health

## 2018-02-04 ENCOUNTER — Other Ambulatory Visit: Payer: Self-pay | Admitting: Nurse Practitioner

## 2018-02-04 DIAGNOSIS — E1122 Type 2 diabetes mellitus with diabetic chronic kidney disease: Secondary | ICD-10-CM | POA: Diagnosis not present

## 2018-02-05 ENCOUNTER — Other Ambulatory Visit: Payer: Self-pay | Admitting: Adult Health

## 2018-02-05 LAB — BASIC METABOLIC PANEL
BUN/Creatinine Ratio: 14 (ref 10–24)
BUN: 30 mg/dL — ABNORMAL HIGH (ref 8–27)
CO2: 21 mmol/L (ref 20–29)
Calcium: 9.6 mg/dL (ref 8.6–10.2)
Chloride: 104 mmol/L (ref 96–106)
Creatinine, Ser: 2.22 mg/dL — ABNORMAL HIGH (ref 0.76–1.27)
GFR calc Af Amer: 31 mL/min/{1.73_m2} — ABNORMAL LOW (ref >59)
GFR calc non Af Amer: 27 mL/min/{1.73_m2} — ABNORMAL LOW (ref >59)
Glucose: 165 mg/dL — ABNORMAL HIGH (ref 65–99)
Potassium: 4.6 mmol/L (ref 3.5–5.2)
Sodium: 142 mmol/L (ref 134–144)

## 2018-02-06 ENCOUNTER — Ambulatory Visit (INDEPENDENT_AMBULATORY_CARE_PROVIDER_SITE_OTHER): Payer: Medicare Other | Admitting: Nurse Practitioner

## 2018-02-06 ENCOUNTER — Encounter: Payer: Self-pay | Admitting: Nurse Practitioner

## 2018-02-06 VITALS — BP 126/78 | HR 67 | Resp 16 | Ht 72.0 in | Wt 258.6 lb

## 2018-02-06 DIAGNOSIS — E1122 Type 2 diabetes mellitus with diabetic chronic kidney disease: Secondary | ICD-10-CM

## 2018-02-06 DIAGNOSIS — M1612 Unilateral primary osteoarthritis, left hip: Secondary | ICD-10-CM | POA: Diagnosis not present

## 2018-02-06 DIAGNOSIS — I480 Paroxysmal atrial fibrillation: Secondary | ICD-10-CM | POA: Diagnosis not present

## 2018-02-06 DIAGNOSIS — N183 Chronic kidney disease, stage 3 (moderate): Secondary | ICD-10-CM

## 2018-02-06 DIAGNOSIS — E782 Mixed hyperlipidemia: Secondary | ICD-10-CM | POA: Diagnosis not present

## 2018-02-06 DIAGNOSIS — I10 Essential (primary) hypertension: Secondary | ICD-10-CM | POA: Diagnosis not present

## 2018-02-06 NOTE — Progress Notes (Signed)
[facility] [place], [place] [address]  Internal MEDICINE  Office Visit Note  Patient Name: [name]  [id number]  [id number]  Date of Service: 02/12/2018  Chief Complaint  Patient presents with  . Labs Only    2wk follow up ultrasound results  . Cough    been coughing for the past few days    The patient is here for routine follow up. Had abnormal renal functions which improved a little with second check of functions in 10/09/2017. He has had ultrasound of the kidneys and BMP rechecked. Renal functions are still elevated, however, BUN and creatinine continue to improve. His renal ultrasound does indicate chronic medical renal disease, but was without focal lesion, mass, or abnormality.  He has worsening left hip pain with history of bone on bone arthritis - has seen orthopedics in the past in [place]. He has received cortisone injection in the past which helped, however, he is no longer getting relief from this. He admits that prior treatment was a few years ago.   Cough  Pertinent negatives include no chest pain, chills, headaches, postnasal drip, rash, rhinorrhea, sore throat, shortness of breath or wheezing. There is no history of environmental allergies.       Current Medication: Outpatient Encounter Medications as of 02/06/2018  Medication Sig  . apixaban (ELIQUIS) 5 MG TABS tablet Take 1 tablet by mouth 2 (two) times daily.  Marland Kitchen atorvastatin (LIPITOR) 40 MG tablet Take 1 tablet (40 mg total) by mouth daily at 6 PM.  . carvedilol (COREG) 25 MG tablet Take 25 mg by mouth 2 (two) times daily with a meal.  . ELIQUIS 5 MG TABS tablet   . furosemide (LASIX) 20 MG tablet Take 1 tablet (20 mg total) by mouth daily.  Marland Kitchen glipiZIDE (GLUCOTROL XL) 5 MG 24 hr tablet Take 1 tablet (5 mg total) by mouth daily with breakfast.  . ONETOUCH VERIO test strip USE UTD TO CHECK BLOOD SUGAR ONCE D  . pantoprazole (PROTONIX) 40 MG tablet TAKE 1 TABLET(40 MG) BY MOUTH  DAILY  . nitroGLYCERIN (NITROSTAT) 0.4 MG SL tablet Place 1 tablet (0.4 mg total) under the tongue every 5 (five) minutes as needed for chest pain.  Marland Kitchen PRADAXA 150 MG CAPS capsule Take 150 mg by mouth 2 (two) times daily.  . [DISCONTINUED] pantoprazole (PROTONIX) 40 MG tablet TAKE 1 TABLET(40 MG) BY MOUTH DAILY   No facility-administered encounter medications on file as of 02/06/2018.     Surgical History: Past Surgical History:  Procedure Laterality Date  . CATARACT EXTRACTION    . PROSTATE CANCER    . PROSTATE SURGERY      Medical History: Past Medical History:  Diagnosis Date  . Atrial fibrillation ([place])   . CHF (congestive heart failure) ([place])   . Diabetes ([place])   . Hearing loss   . High blood pressure   . History of bladder problems   . Kidney stones 11/22/2014    Family History: Family History  Problem Relation Age of Onset  . Colon cancer Mother   . Diabetes Other   . High blood pressure Other   . Prostate cancer Brother   . Diabetes Brother     Social History   Socioeconomic History  . Marital status: Married    Spouse name: Not on file  . Number of children: Not on file  . Years of education: Not on file  . Highest education level: Not on file  Occupational History  .  Occupation: retired  Scientific laboratory technician  . Financial resource strain: Not hard at all  . Food insecurity:    Worry: Never true    Inability: Never true  . Transportation needs:    Medical: No    Non-medical: No  Tobacco Use  . Smoking status: Former Smoker    Years: 16.00    Types: Cigarettes  . Smokeless tobacco: Never Used  Substance and Sexual Activity  . Alcohol use: No  . Drug use: No  . Sexual activity: Not Currently  Lifestyle  . Physical activity:    Days per week: 5 days    Minutes per session: 150+ min  . Stress: Only a little  Relationships  . Social connections:    Talks on phone: Twice a week    Gets together: Twice a week    Attends religious service: 1 to 4 times  per year    Active member of club or organization: No    Attends meetings of clubs or organizations: Never    Relationship status: Married  . Intimate partner violence:    Fear of current or ex partner: No    Emotionally abused: No    Physically abused: No    Forced sexual activity: No  Other Topics Concern  . Not on file  Social History Narrative  . Not on file      Review of Systems  Constitutional: Negative for activity change, chills, fatigue and unexpected weight change.  HENT: Negative for congestion, postnasal drip, rhinorrhea, sneezing, sore throat and voice change.   Respiratory: Negative for cough, chest tightness, shortness of breath and wheezing.   Cardiovascular: Negative for chest pain and palpitations.  Gastrointestinal: Negative for abdominal pain, constipation, diarrhea, nausea and vomiting.  Endocrine: Negative for cold intolerance, heat intolerance, polydipsia and polyuria.       Improving blood sugars.  Genitourinary: Negative for dysuria and frequency.  Musculoskeletal: Positive for arthralgias. Negative for back pain, joint swelling and neck pain.       Left hip pain is worsening.   Skin: Negative for rash.  Allergic/Immunologic: Negative for environmental allergies.  Neurological: Negative for dizziness, tremors, numbness and headaches.  Hematological: Negative for adenopathy. Does not bruise/bleed easily.  Psychiatric/Behavioral: Negative for behavioral problems (Depression), sleep disturbance and suicidal ideas. The patient is not nervous/anxious.     Today's Vitals   02/06/18 0856  BP: 126/78  Pulse: 67  Resp: 16  SpO2: 94%  Weight: 258 lb 9.6 oz (117.3 kg)  Height: 6' (1.829 m)    Physical Exam Vitals signs and nursing note reviewed.  Constitutional:      General: He is not in acute distress.    Appearance: Normal appearance. He is well-developed. He is not diaphoretic.  HENT:     Head: Normocephalic and atraumatic.     Nose: Nose normal.      Mouth/Throat:     Pharynx: No oropharyngeal exudate.  Eyes:     Conjunctiva/sclera: Conjunctivae normal.     Pupils: Pupils are equal, round, and reactive to light.  Neck:     Musculoskeletal: Normal range of motion and neck supple.     Thyroid: No thyromegaly.     Vascular: No carotid bruit or JVD.     Trachea: No tracheal deviation.  Cardiovascular:     Rate and Rhythm: Normal rate. Rhythm irregular.     Pulses:          Dorsalis pedis pulses are 1+ on the right side and  1+ on the left side.       Posterior tibial pulses are 1+ on the right side and 1+ on the left side.     Heart sounds: Normal heart sounds. No murmur. No friction rub. No gallop.      Comments: Irregular heart rhythm with soft, blowing murmur. Pulmonary:     Effort: Pulmonary effort is normal. No respiratory distress.     Breath sounds: Normal breath sounds. No wheezing or rales.  Chest:     Chest wall: No tenderness.  Abdominal:     General: Bowel sounds are normal.     Palpations: Abdomen is soft.     Tenderness: There is no abdominal tenderness.  Musculoskeletal: Normal range of motion.     Right foot: Normal range of motion. No deformity.     Left foot: Normal range of motion. No deformity.     Comments: Left hip pain with reduced ROM and strength due to pain. Walking with mild limp favoring the left side .  Feet:     Right foot:     Protective Sensation: 7 sites tested. 7 sites sensed.     Skin integrity: Dry skin present. No skin breakdown.     Left foot:     Protective Sensation: 7 sites tested. 7 sites sensed.     Skin integrity: Dry skin present. No skin breakdown.  Lymphadenopathy:     Cervical: No cervical adenopathy.  Skin:    General: Skin is warm and dry.     Capillary Refill: Capillary refill takes less than 2 seconds.  Neurological:     Mental Status: He is alert and oriented to person, place, and time. Mental status is at baseline.     Cranial Nerves: No cranial nerve deficit.      Sensory: No sensory deficit.  Psychiatric:        Mood and Affect: Mood normal.        Behavior: Behavior normal.        Thought Content: Thought content normal.        Judgment: Judgment normal.    Assessment/Plan: 1. Primary osteoarthritis of left hip Worsening, chronic problem. Refer to orthopedic provider for furher evaluation and treatment.  - Ambulatory referral to Orthopedic Surgery  2. Essential hypertension Stable. Continue bp medication as prescribed .  3. Type 2 diabetes mellitus with stage 3 chronic kidney disease, without long-term current use of insulin ([place]) Renal functions improving. Reviewed ultrasound of kidneys, shwoing evidence of medical renal disease without evidence of acute disease process. Will conitnue current medications and monitor closely.  4. Paroxysmal A-fib [facility]) Cardiology visits as scheduled   5. Hyperlipidemia, mixed Continue atorvastatin as prescribed   General Counseling: gabreal worton understanding of the findings of todays visit and agrees with plan of treatment. I have discussed any further diagnostic evaluation that may be needed or ordered today. We also reviewed his medications today. he has been encouraged to call the office with any questions or concerns that should arise related to todays visit.  Cardiac risk factor modification:  1. Control blood pressure. 2. Exercise as prescribed. 3. Follow low sodium, low fat diet. and low fat and low cholestrol diet. 4. Take ASA 81mg  once a day. 5. Restricted calories diet to lose weight.  This patient was seen by Leretha Pol FNP Collaboration with Dr Lavera Guise as a part of collaborative care agreement  Orders Placed This Encounter  Procedures  . Ambulatory referral to Orthopedic Surgery  Time spent: [place] [facility] medicine

## 2018-02-12 DIAGNOSIS — M1612 Unilateral primary osteoarthritis, left hip: Secondary | ICD-10-CM | POA: Insufficient documentation

## 2018-02-13 ENCOUNTER — Other Ambulatory Visit: Payer: Self-pay | Admitting: Nurse Practitioner

## 2018-02-13 MED ORDER — ONETOUCH VERIO VI STRP: ORAL_STRIP | 11 refills | Status: DC

## 2018-02-15 ENCOUNTER — Emergency Department
Admission: EM | Admit: 2018-02-15 | Discharge: 2018-02-15 | Disposition: A | Discharge status: Home / Self Care | Payer: Medicare Other | Attending: Emergency Medicine | Admitting: Emergency Medicine

## 2018-02-15 ENCOUNTER — Emergency Department: Payer: Medicare Other

## 2018-02-15 ENCOUNTER — Encounter: Payer: Self-pay | Admitting: Emergency Medicine

## 2018-02-15 DIAGNOSIS — Z7901 Long term (current) use of anticoagulants: Secondary | ICD-10-CM | POA: Diagnosis not present

## 2018-02-15 DIAGNOSIS — E1122 Type 2 diabetes mellitus with diabetic chronic kidney disease: Secondary | ICD-10-CM | POA: Diagnosis not present

## 2018-02-15 DIAGNOSIS — I4891 Unspecified atrial fibrillation: Secondary | ICD-10-CM | POA: Diagnosis not present

## 2018-02-15 DIAGNOSIS — I5022 Chronic systolic (congestive) heart failure: Secondary | ICD-10-CM | POA: Diagnosis not present

## 2018-02-15 DIAGNOSIS — N183 Chronic kidney disease, stage 3 (moderate): Secondary | ICD-10-CM | POA: Diagnosis not present

## 2018-02-15 DIAGNOSIS — I130 Hypertensive heart and chronic kidney disease with heart failure and stage 1 through stage 4 chronic kidney disease, or unspecified chronic kidney disease: Secondary | ICD-10-CM | POA: Diagnosis not present

## 2018-02-15 DIAGNOSIS — Z7984 Long term (current) use of oral hypoglycemic drugs: Secondary | ICD-10-CM | POA: Insufficient documentation

## 2018-02-15 DIAGNOSIS — K5732 Diverticulitis of large intestine without perforation or abscess without bleeding: Secondary | ICD-10-CM | POA: Diagnosis not present

## 2018-02-15 DIAGNOSIS — K625 Hemorrhage of anus and rectum: Secondary | ICD-10-CM

## 2018-02-15 DIAGNOSIS — Z87891 Personal history of nicotine dependence: Secondary | ICD-10-CM | POA: Diagnosis not present

## 2018-02-15 DIAGNOSIS — R1011 Right upper quadrant pain: Secondary | ICD-10-CM | POA: Insufficient documentation

## 2018-02-15 DIAGNOSIS — Z79899 Other long term (current) drug therapy: Secondary | ICD-10-CM | POA: Insufficient documentation

## 2018-02-15 DIAGNOSIS — R001 Bradycardia, unspecified: Secondary | ICD-10-CM | POA: Diagnosis not present

## 2018-02-15 DIAGNOSIS — N200 Calculus of kidney: Secondary | ICD-10-CM | POA: Diagnosis not present

## 2018-02-15 LAB — COMPREHENSIVE METABOLIC PANEL
ALT: 27 U/L (ref 0–44)
AST: 21 U/L (ref 15–41)
Albumin: 3.8 g/dL (ref 3.5–5.0)
Alkaline Phosphatase: 93 U/L (ref 38–126)
Anion gap: 5 (ref 5–15)
BUN: 39 mg/dL — ABNORMAL HIGH (ref 8–23)
CO2: 29 mmol/L (ref 22–32)
Calcium: 9.3 mg/dL (ref 8.9–10.3)
Chloride: 107 mmol/L (ref 98–111)
Creatinine, Ser: 2.06 mg/dL — ABNORMAL HIGH (ref 0.61–1.24)
GFR calc Af Amer: 34 mL/min — ABNORMAL LOW (ref >60)
GFR calc non Af Amer: 29 mL/min — ABNORMAL LOW (ref >60)
Glucose, Bld: 134 mg/dL — ABNORMAL HIGH (ref 70–99)
Potassium: 4.4 mmol/L (ref 3.5–5.1)
Sodium: 141 mmol/L (ref 135–145)
Total Bilirubin: 0.7 mg/dL (ref 0.3–1.2)
Total Protein: 7.5 g/dL (ref 6.5–8.1)

## 2018-02-15 LAB — CBC
HCT: 45.7 % (ref 39.0–52.0)
Hemoglobin: 14.1 g/dL (ref 13.0–17.0)
MCH: 28.5 pg (ref 26.0–34.0)
MCHC: 30.9 g/dL (ref 30.0–36.0)
MCV: 92.5 fL (ref 80.0–100.0)
Platelets: 175 10*3/uL (ref 150–400)
RBC: 4.94 MIL/uL (ref 4.22–5.81)
RDW: 14.3 % (ref 11.5–15.5)
WBC: 5.6 10*3/uL (ref 4.0–10.5)
nRBC: 0 % (ref 0.0–0.2)

## 2018-02-15 LAB — LIPASE, BLOOD: Lipase: 35 U/L (ref 11–51)

## 2018-02-15 MED ORDER — IOPAMIDOL (ISOVUE-300) INJECTION 61%
30.0000 mL | Freq: Once | INTRAVENOUS | Status: AC | PRN
  Administered 2018-02-15: 30 mL via ORAL

## 2018-02-15 MED ORDER — SODIUM CHLORIDE 0.9% FLUSH: 3.0000 mL | Freq: Once | INTRAVENOUS | Status: DC

## 2018-02-15 NOTE — ED Notes (Signed)
Pt ambulatory to restroom. Unable to provide urine sample at this time. 

## 2018-02-15 NOTE — ED Provider Notes (Signed)
[facility] Emergency Department Provider Note   ____________________________________________   First MD Initiated Contact with Patient 02/15/18 1157     (approximate)  I have reviewed the triage vital signs and the nursing notes.   HISTORY  Chief Complaint Rectal Bleeding and Abdominal Pain    HPI [name] is a 85 y.o. male who reports intermittent episodes of bright red blood per rectum.  Lately he has been having a lot of pain and burning when he wipes or when he tries to stool.  This morning he developed some right upper quadrant pain.  It went away when I saw him but when I did the rectal exam it came back.  Is tender to palpation percussion.  Pain is moderate just deep and achy in nature.  The the rectal pain is burning and fairly severe when it happens.   Past Medical History:  Diagnosis Date  . Atrial fibrillation ([place])   . CHF (congestive heart failure) ([place])   . Diabetes ([place])   . Hearing loss   . High blood pressure   . History of bladder problems   . Kidney stones 11/22/2014    Patient Active Problem List   Diagnosis Date Noted  . Primary osteoarthritis of left hip 02/12/2018  . Bilateral hearing loss due to cerumen impaction 10/06/2017  . Chronic systolic heart failure ([place]) 08/19/2017  . Atrial fibrillation with RVR ([place]) 08/02/2017  . Acute CHF (congestive heart failure) ([place]) 08/02/2017  . Prostatic hypertrophy 07/20/2017  . Hyperpigmentation 04/10/2017  . Uncontrolled type 2 diabetes mellitus with hyperglycemia ([place]) 03/24/2017  . Encounter for general adult medical examination with abnormal findings 03/24/2017  . Chest pain 05/04/2016  . Elevated troponin 05/04/2016  . Essential hypertension 07/06/2014  . Cough 03/21/2014  . OSA (obstructive sleep apnea) 03/21/2014  . Paroxysmal A-fib ([place]) 03/21/2014  . Hx of adenomatous colonic polyps 06/28/2013  . Liver cyst 06/28/2013  . Chronic kidney disease 06/08/2013  .  Type 2 diabetes mellitus with stage 3 chronic kidney disease, without long-term current use of insulin ([place]) 06/08/2013  . Hyperlipidemia, mixed 06/08/2013  . Valvular heart disease 06/08/2013    Past Surgical History:  Procedure Laterality Date  . CATARACT EXTRACTION    . PROSTATE CANCER    . PROSTATE SURGERY      Prior to Admission medications   Medication Sig Start Date End Date Taking? Authorizing Provider  apixaban (ELIQUIS) 5 MG TABS tablet Take 1 tablet by mouth 2 (two) times daily. 12/03/17  Yes [provider]  atorvastatin (LIPITOR) 40 MG tablet Take 1 tablet (40 mg total) by mouth daily at 6 PM. 10/02/17  Yes Boscia, Heather E, NP  carvedilol (COREG) 25 MG tablet Take 25 mg by mouth 2 (two) times daily with a meal.   Yes [provider]  furosemide (LASIX) 20 MG tablet Take 1 tablet (20 mg total) by mouth daily. 12/31/17  Yes Darylene Price A, FNP  glipiZIDE (GLUCOTROL XL) 5 MG 24 hr tablet Take 1 tablet (5 mg total) by mouth daily with breakfast. 01/08/18  Yes Boscia, Heather E, NP  pantoprazole (PROTONIX) 40 MG tablet TAKE 1 TABLET(40 MG) BY MOUTH DAILY 01/26/18  Yes Boscia, Heather E, NP  nitroGLYCERIN (NITROSTAT) 0.4 MG SL tablet Place 1 tablet (0.4 mg total) under the tongue every 5 (five) minutes as needed for chest pain. 05/05/16   Theodoro Grist, MD  [facility] VERIO test strip USE UTD TO CHECK BLOOD SUGAR ONCE DX  E11.65 02/13/18   Ronnell Freshwater, NP    Allergies Patient has no known allergies.  Family History  Problem Relation Age of Onset  . Colon cancer Mother   . Diabetes Other   . High blood pressure Other   . Prostate cancer Brother   . Diabetes Brother     Social History Social History   Tobacco Use  . Smoking status: Former Smoker    Years: 16.00    Types: Cigarettes  . Smokeless tobacco: Never Used  Substance Use Topics  . Alcohol use: No  . Drug use: No    Review of Systems  Constitutional: No fever/chills Eyes: No visual  changes. ENT: No sore throat. Cardiovascular: Denies chest pain. Respiratory: Denies shortness of breath. Gastrointestinal: See HPI he is not had nausea vomiting or diarrhea Genitourinary: Negative for dysuria. Musculoskeletal: Negative for back pain. Skin: Negative for rash. Neurological: Negative for headaches, focal weakness   ____________________________________________   PHYSICAL EXAM:  VITAL SIGNS: ED Triage Vitals  Enc Vitals Group     BP 02/15/18 1120 97/74     Pulse Rate 02/15/18 1120 61     Resp 02/15/18 1120 18     Temp 02/15/18 1120 97.7 F (36.5 C)     Temp Source 02/15/18 1120 Oral     SpO2 02/15/18 1120 97 %     Weight 02/15/18 1120 245 lb (111.1 kg)     Height 02/15/18 1120 6' (1.829 m)     Head Circumference --      Peak Flow --      Pain Score 02/15/18 1129 6     Pain Loc --      Pain Edu? --      Excl. in [place]? --     Constitutional: Alert and oriented. Well appearing and in no acute distress.  Please note the patient's blood pressure in the room is more like 366 systolic not 97 Eyes: Conjunctivae are normal. Head: Atraumatic. Nose: No congestion/rhinnorhea. Mouth/Throat: Mucous membranes are moist.  Oropharynx non-erythematous. Neck: No stridor.   Cardiovascular: Normal rate, regular rhythm. Grossly normal heart sounds.  Good peripheral circulation. Respiratory: Normal respiratory effort.  No retractions. Lungs CTAB. Gastrointestinal: Soft and nontender.  Later he had tenderness palpation percussion of the right upper quadrant.  No distention. No abdominal bruits.  Patient has diastases recti no CVA tenderness. Rectal: The perirectal skin is red and inflamed the rectum itself is tender there is no evidence of hemorrhoids that I can see or feel.  The stool is watery blood mixed with stool.  It of course is Hemoccult positive  musculoskeletal: No lower extremity tenderness nor edema.  No joint effusions. Neurologic:  Normal speech and language. No gross  focal neurologic deficits are appreciated.  Skin:  Skin is warm, dry and intact. No rash noted. Psychiatric: Mood and affect are normal. Speech and behavior are normal.  ____________________________________________   LABS (all labs ordered are listed, but only abnormal results are displayed)  Labs Reviewed  COMPREHENSIVE METABOLIC PANEL - Abnormal; Notable for the following components:      Result Value   Glucose, Bld 134 (*)    BUN 39 (*)    Creatinine, Ser 2.06 (*)    GFR calc non Af Amer 29 (*)    GFR calc Af Amer 34 (*)    All other components within normal limits  LIPASE, BLOOD  CBC  URINALYSIS, COMPLETE (UACMP) WITH MICROSCOPIC   ____________________________________________  EKG  EKG read  and interpreted by me shows A. fib at a rate of 90 left axis no acute ST-T wave changes there is right bundle branch block as well A. fib is old right bundle branch block is new bradycardia had QRS duration 116 ms before so was incomplete right bundle before ____________________________________________  RADIOLOGY  ED MD interpretation  Official radiology report(s): Ct Abdomen Pelvis Wo Contrast  Result Date: 02/15/2018 CLINICAL DATA:  Lower abdominal pain for several hours with rectal bleeding EXAM: CT ABDOMEN AND PELVIS WITHOUT CONTRAST TECHNIQUE: Multidetector CT imaging of the abdomen and pelvis was performed following the standard protocol without IV contrast. COMPARISON:  08/19/2016 FINDINGS: Lower chest: No acute abnormality. Hepatobiliary: Stable appendix cystic changes are noted. The gallbladder is within normal limits. Pancreas: Unremarkable. No pancreatic ductal dilatation or surrounding inflammatory changes. Spleen: Normal in size without focal abnormality. Adrenals/Urinary Tract: Adrenal glands are within normal limits. Kidneys demonstrate bilateral nonobstructing renal calculi. The ureters are within normal limits. The bladder is decompressed. Stomach/Bowel: Diverticular change  of the colon is noted without evidence of diverticulitis. No obstructive or inflammatory changes are seen. The appendix is not well visualized. No small bowel abnormality is seen. Vascular/Lymphatic: Aortic atherosclerosis. No enlarged abdominal or pelvic lymph nodes. Mild stable dilatation of the infrarenal aorta to 3.2 cm is noted. Reproductive: Prostate has been surgically removed. Other: No abdominal wall hernia or abnormality. No abdominopelvic ascites. Musculoskeletal: Degenerative changes of lumbar spine are noted. No other focal abnormality is noted. IMPRESSION: Diverticulosis without diverticulitis. Bilateral nonobstructing renal calculi. Stable dilatation of the abdominal aorta 3.2 cm. Recommend followup by ultrasound in 3 years. This recommendation follows ACR consensus guidelines: White Paper of the ACR Incidental Findings Committee II on Vascular Findings. J Am Coll Radiol 2013; 10:789-794 Stable hepatic cysts. Electronically Signed   By: Inez Catalina M.D.   On: 02/15/2018 13:59    ____________________________________________   PROCEDURES  Procedure(s) performed:   Procedures  Critical Care performed:   ____________________________________________  [place] patient with Dr. Marius Ditch on-call for GI.  She reviews the records.  The patient's hematocrit and hemoglobin hematocrit are stable.  He had a colonoscopy that was unrevealing recently at [facility]  She recommends trying calmoseptine to protect the perianal area.  Anusol twice a day for 2 weeks to help with the bleeding.  She will follow him up in the office.  I will also recommend Balneol-HC to help him with cleaning after stooling.  He will return for any further problems I         ____________________________________________   FINAL CLINICAL IMPRESSION(S) / ED DIAGNOSES  Final diagnoses:  Rectal bleeding     ED Discharge Orders    None       Note:  This document was prepared using Dragon voice recognition software and  may include unintentional dictation errors.    Nena Polio, MD 02/15/18 [phone]

## 2018-02-15 NOTE — ED Notes (Signed)
First Nurse Note: Pt to ED c/o Rectal pain and bleeding. Pt is in NAD.

## 2018-02-15 NOTE — Discharge Instructions (Addendum)
I discussed your case with Dr. Marius Ditch the gastroenterologist.  She recommends using calmoseptine which is over-the-counter to protect your bottom apply it at least twice a day.  Use the [facility] to help facilitate cleaning after stooling.  And use Anusol twice a day per rectum to help with the bleeding.  Please call her office or see your regular GI doctor to arrange follow-up is coming week.  Please return here for fever increasing pain redness any swelling or if you get lightheaded or have heavier bleeding.

## 2018-02-15 NOTE — ED Notes (Signed)
Blood occult by Dr. Cinda Quest positive.

## 2018-02-15 NOTE — ED Triage Notes (Signed)
Pt to ED with c/o of rectal bleeding that he states is bright red. Per pt bleeding has been ongoing for more than a month. Pt now has sudden onset RUQ pain.

## 2018-02-16 ENCOUNTER — Other Ambulatory Visit (HOSPITAL_COMMUNITY): Payer: Self-pay

## 2018-02-16 ENCOUNTER — Encounter (HOSPITAL_COMMUNITY): Payer: Self-pay

## 2018-02-16 NOTE — Progress Notes (Signed)
Bp: 138/90, pulse: 68, resp: 18, spo2: 98 todays weight: 244 lbs, bgl: [place] states feeling good.  He states did get the medication from ER this weekend.  He states will call and make appt for his followup.  His weight had stayed in last month within 2 lbs everyday.  He states doing well working everyday.  He has pain in hip they are doing some test.  He has been taking his medications and he has been placing them in med box.  Meds verified.  He denies chest pain, shortness of breath, headaches, or dizziness.  Some swelling in extremities, no pitting edema. He watches his diet and fluids intake.  Will continue to visit at home for heart failure.   [place] [phone]

## 2018-02-19 DIAGNOSIS — G4733 Obstructive sleep apnea (adult) (pediatric): Secondary | ICD-10-CM | POA: Diagnosis not present

## 2018-02-20 ENCOUNTER — Telehealth: Payer: Self-pay | Admitting: Gastroenterology

## 2018-02-20 NOTE — Telephone Encounter (Signed)
I have tried to call patient to schedule an appointment from the ED with Dr Marius Ditch per my in basket. No answer.

## 2018-02-24 ENCOUNTER — Ambulatory Visit: Payer: Medicare Other | Admitting: Family

## 2018-02-24 ENCOUNTER — Encounter: Payer: Self-pay | Admitting: Gastroenterology

## 2018-02-24 ENCOUNTER — Ambulatory Visit: Payer: Medicare Other | Admitting: Gastroenterology

## 2018-02-24 VITALS — BP 101/70 | HR 60 | Resp 17 | Ht 72.0 in | Wt 250.2 lb

## 2018-02-24 DIAGNOSIS — K529 Noninfective gastroenteritis and colitis, unspecified: Secondary | ICD-10-CM

## 2018-02-24 DIAGNOSIS — K640 First degree hemorrhoids: Secondary | ICD-10-CM

## 2018-02-24 DIAGNOSIS — K625 Hemorrhage of anus and rectum: Secondary | ICD-10-CM

## 2018-02-24 DIAGNOSIS — L538 Other specified erythematous conditions: Secondary | ICD-10-CM | POA: Diagnosis not present

## 2018-02-24 MED ORDER — HYDROCORTISONE 2.5 % RE CREA: 1.0000 "application " | TOPICAL_CREAM | Freq: Two times a day (BID) | RECTAL | 1 refills | Status: DC

## 2018-02-24 MED ORDER — DICYCLOMINE HCL 10 MG PO CAPS: 20.0000 mg | ORAL_CAPSULE | Freq: Three times a day (TID) | ORAL | 0 refills | Status: DC

## 2018-02-24 NOTE — Patient Instructions (Signed)
1. Start Bentyl 20mg  before each meal. Reduce the dose if it leads to constipation  2. Start anusol cream inside anal canal two times a day for 2weeks 3. Witch hazel and tucks pads for burning, itching   Please call our office to speak with my nurse Barnie Alderman [phone] during business hours from 8am to 4pm if you have any questions/concerns. During after hours, you will be redirected to on call GI physician. For any emergency please call 911 or go the nearest emergency room.    Cephas Darby, MD [address]  [place]  [place], [place] [address]  Main: [phone]  Fax: [phone]

## 2018-02-24 NOTE — Progress Notes (Signed)
Cephas Darby, MD [address]  [place], [place] [address]  Main: [phone]  Fax: [phone]    Gastroenterology Consultation  Referring Provider:     Ronnell Freshwater, NP Primary Care Physician:  Ronnell Freshwater, NP Primary Gastroenterologist:  Dr. Cephas Darby Reason for Consultation:     Rectal bleeding        HPI:   [name] is a 85 y.o. male referred by Dr. Ronnell Freshwater, NP  for consultation & management of rectal bleeding. Pt went to ER last week due to perianal pain, burning and bright red blood per rectum on wiping and in toilet. He generally has soft brown Bms, especially after eating. His Hb was normal, discharged home with a GI follow up, recommended Calmoseptine cream for perianal rash and other symptoms. Pt had another flare up past weekend. He is accompanied by his wife today. He had a colonoscopy in [place] in 2015, reportedly normal and he was told that was his last one. He denies any other symptoms  NSAIDs: none  Antiplts/Anticoagulants/Anti thrombotics: eliquis for A Fib  GI Procedures: Colonoscopy in 2015 at [facility], report not available  Past Medical History:  Diagnosis Date  . Atrial fibrillation ([place])   . CHF (congestive heart failure) ([place])   . Diabetes ([place])   . Hearing loss   . High blood pressure   . History of bladder problems   . Kidney stones 11/22/2014    Past Surgical History:  Procedure Laterality Date  . CATARACT EXTRACTION    . PROSTATE CANCER    . PROSTATE SURGERY      Current Outpatient Medications:  .  apixaban (ELIQUIS) 5 MG TABS tablet, Take 1 tablet by mouth 2 (two) times daily., Disp: , Rfl:  .  atorvastatin (LIPITOR) 40 MG tablet, Take 1 tablet (40 mg total) by mouth daily at 6 PM., Disp: 30 tablet, Rfl: 5 .  carvedilol (COREG) 25 MG tablet, Take 25 mg by mouth 2 (two) times daily with a meal., Disp: , Rfl:  .  furosemide (LASIX) 20 MG tablet, Take 1 tablet (20 mg total) by mouth daily.,  Disp: 90 tablet, Rfl: 3 .  glipiZIDE (GLUCOTROL XL) 5 MG 24 hr tablet, Take 1 tablet (5 mg total) by mouth daily with breakfast., Disp: 30 tablet, Rfl: 3 .  nitroGLYCERIN (NITROSTAT) 0.4 MG SL tablet, Place 1 tablet (0.4 mg total) under the tongue every 5 (five) minutes as needed for chest pain., Disp: 30 tablet, Rfl: 12 .  ONETOUCH VERIO test strip, USE UTD TO CHECK BLOOD SUGAR ONCE DX E11.65, Disp: 100 each, Rfl: 11 .  pantoprazole (PROTONIX) 40 MG tablet, TAKE 1 TABLET(40 MG) BY MOUTH DAILY, Disp: 30 tablet, Rfl: 6 .  dicyclomine (BENTYL) 10 MG capsule, Take 2 capsules (20 mg total) by mouth 3 (three) times daily before meals., Disp: 30 capsule, Rfl: 0 .  hydrocortisone (ANUSOL-HC) 2.5 % rectal cream, Place 1 application rectally 2 (two) times daily., Disp: 30 g, Rfl: 1   Family History  Problem Relation Age of Onset  . Colon cancer Mother   . Diabetes Other   . High blood pressure Other   . Prostate cancer Brother   . Diabetes Brother      Social History   Tobacco Use  . Smoking status: Former Smoker    Years: 16.00    Types: Cigarettes  . Smokeless tobacco: Never Used  Substance Use Topics  . Alcohol  use: No  . Drug use: No    Allergies as of 02/24/2018  . (No Known Allergies)    Review of Systems:    All systems reviewed and negative except where noted in HPI.   Physical Exam:  BP 101/70 (BP Location: Left Arm, Patient Position: Sitting, Cuff Size: Large)   Pulse 60   Resp 17   Ht 6' (1.829 m)   Wt 250 lb 3.2 oz (113.5 kg)   SpO2 99%   BMI 33.93 kg/m  No LMP for male patient.  General:   Alert,  Well-developed, well-nourished, pleasant and cooperative in NAD Head:  Normocephalic and atraumatic. Eyes:  Sclera clear, no icterus.   Conjunctiva pink. Ears:  Normal auditory acuity. Nose:  No deformity, discharge, or lesions. Mouth:  No deformity or lesions,oropharynx pink & moist. Neck:  Supple; no masses or thyromegaly. Lungs:  Respirations even and unlabored.   Clear throughout to auscultation.   No wheezes, crackles, or rhonchi. No acute distress. Heart:  Regular rate and rhythm; no murmurs, clicks, rubs, or gallops. Abdomen:  Normal bowel sounds. Soft, non-tender and non-distended without masses, hepatosplenomegaly or hernias noted.  No guarding or rebound tenderness.   Rectal: Perianal rash, no perianal skin tags or lesions, small external hemorrhoids appreciated on DRE, nontender Msk:  Symmetrical without gross deformities. Good, equal movement & strength bilaterally. Pulses:  Normal pulses noted. Extremities:  No clubbing or edema.  No cyanosis. Neurologic:  Alert and oriented x3;  grossly normal neurologically. Skin:  Intact without significant lesions or rashes. No jaundice. Psych:  Alert and cooperative. Normal mood and affect.  Imaging Studies: reviewed  Assessment and Plan:   [name] is a 85 y.o. male with A Fib on eliquis, CHF, metabolic syndrome with 2weeks of BRBPR, perianal burning, rash. He also has chronic post prandial diarrhea and cramps  Rectal bleeding with other perianal symptoms: Probably from external hemorrhoids Hb normal Try anusol cream BID for 2weeks Recommend witch hazel and tucks pads Defer colonoscopy I discussed with him about hemorrhoid ligation if medical therapy fails, for this he has to be off eliquis for 2 days before and after banding, need clearance from cardiology  Post prandial cramps and diarrhea Trial of bentyl 20mg  before each meal   Follow up in 4weeks   Cephas Darby, MD

## 2018-03-01 NOTE — Progress Notes (Deleted)
Patient ID: [name], male    DOB: December 19, 1933, 85 y.o.   MRN: [id number]  HPI  [name] is a 85 y/o male with a history of DM, HTN, CKD, atrial fibrillation, previous tobacco use and chronic heart failure.   Echo report from 08/02/17 reviewed and showed an EF of 35-40% along with mild Glenn.   Was in the ED 02/15/2018 due to rectal bleeding where he was treated and released.   He presents today for a follow-up visit with a chief complaint of   Past Medical History:  Diagnosis Date  . Atrial fibrillation ([place])   . CHF (congestive heart failure) ([place])   . Diabetes ([place])   . Hearing loss   . High blood pressure   . History of bladder problems   . Kidney stones 11/22/2014   Past Surgical History:  Procedure Laterality Date  . CATARACT EXTRACTION    . PROSTATE CANCER    . PROSTATE SURGERY     Family History  Problem Relation Age of Onset  . Colon cancer Mother   . Diabetes Other   . High blood pressure Other   . Prostate cancer Brother   . Diabetes Brother    Social History   Tobacco Use  . Smoking status: Former Smoker    Years: 16.00    Types: Cigarettes  . Smokeless tobacco: Never Used  Substance Use Topics  . Alcohol use: No   No Known Allergies    Review of Systems  Constitutional: Positive for fatigue. Negative for appetite change.  HENT: Negative for congestion, postnasal drip and sore throat.   Eyes: Negative.   Respiratory: Positive for cough (dry cough) and shortness of breath. Negative for chest tightness.   Cardiovascular: Negative for chest pain, palpitations and leg swelling.  Gastrointestinal: Negative for abdominal distention and abdominal pain.  Endocrine: Negative.   Genitourinary: Negative.   Musculoskeletal: Positive for neck pain (knot for many years). Negative for back pain.  Skin: Negative.   Allergic/Immunologic: Negative.   Neurological: Negative for dizziness and light-headedness.  Hematological: Negative for adenopathy. Does  not bruise/bleed easily.  Psychiatric/Behavioral: Negative for dysphoric mood and sleep disturbance. The patient is not nervous/anxious.      Physical Exam Vitals signs and nursing note reviewed.  Constitutional:      Appearance: He is well-developed.  HENT:     Head: Normocephalic and atraumatic.  Neck:     Musculoskeletal: Normal range of motion and neck supple.     Vascular: No JVD.  Cardiovascular:     Rate and Rhythm: Bradycardia present. Rhythm irregular.  Pulmonary:     Effort: Pulmonary effort is normal. No respiratory distress.     Breath sounds: No wheezing or rales.  Abdominal:     General: There is no distension.     Palpations: Abdomen is soft.  Musculoskeletal:     Right lower leg: He exhibits no tenderness. No edema.     Left lower leg: He exhibits no tenderness. No edema.  Skin:    General: Skin is warm and dry.  Neurological:     Mental Status: He is alert and oriented to person, place, and time.  Psychiatric:        Behavior: Behavior normal.    Assessment & Plan:  1: Chronic heart failure with reduced ejection fraction- - NYHA class III - euvolemic today - weighing daily and he was reminded to call for an overnight weight gain of >2 pounds or a weekly  weight gain of >5 pounds - weight  - not adding salt and his wife has been reading food labels. Reviewed the importance of closely following a 2000mg  sodium diet  - saw cardiology Lyndel Pleasure) 09/09/17 - wearing compression socks daily - drinking ~ 40+ ounces of fluids daily - wearing oxygen at 2L at bedtime - BNP 08/06/17 was 149.0 - PharmD reconciled medications with patient and his wife - currently participating in paramedicine program  - patient reports receiving his flu vaccine for this season  2: HTN- - BP  - saw PCP Versie Starks) 08/15/17 - BMP 02/15/2018 reviewed and showed sodium 141, potassium 4.4, creatinine 2.06 and GFR 34  3: DM- - glucose at home was  - A1c 01/08/18 was  7.4%   Medication list was reviewed and it was emphasized that he bring all the bottles to every visit every time to ensure accuracy.

## 2018-03-02 ENCOUNTER — Telehealth: Payer: Self-pay | Admitting: Gastroenterology

## 2018-03-02 NOTE — Telephone Encounter (Signed)
Patient's wife called stating he is still having bowl movements with blood. She has called to his Cardiologist and is awaiting his call back. Please advice what to do.

## 2018-03-03 ENCOUNTER — Ambulatory Visit: Payer: Medicare Other | Admitting: Family

## 2018-03-04 ENCOUNTER — Ambulatory Visit: Payer: Medicare Other | Admitting: Gastroenterology

## 2018-03-04 NOTE — Telephone Encounter (Signed)
Spoke with pt's wife and she state pt is doing well right now, he has been cleared to be off anticoagulation but pt is unsure concerning hemorrhoid ligation will call back later

## 2018-03-11 NOTE — Progress Notes (Signed)
Patient ID: [name], male    DOB: Dec 26, 1933, 85 y.o.   MRN: [id number]  HPI  [name] is a 85 y/o male with a history of DM, HTN, CKD, atrial fibrillation, previous tobacco use and chronic heart failure.   Echo report from 08/02/17 reviewed and showed an EF of 35-40% along with mild Glenn.   Was in the ED 02/15/2018 due to rectal bleeding where he was treated and released.   He presents today for a follow-up visit with a chief complaint of minimal shortness of breath upon moderate exertion. He describes this as chronic in nature having been present for several years. He has associated fatigue, dry cough and neck pain along with this. He denies any difficulty sleeping, dizziness, abdominal distention, palpitations, pedal edema, chest pain or weight gain. Had an issue with rectal bleeding with short stoppage of eliquis. Bleeding has stopped and eliquis has been resumed.   Past Medical History:  Diagnosis Date  . Atrial fibrillation ([place])   . CHF (congestive heart failure) ([place])   . Diabetes ([place])   . Hearing loss   . High blood pressure   . History of bladder problems   . Kidney stones 11/22/2014   Past Surgical History:  Procedure Laterality Date  . CATARACT EXTRACTION    . PROSTATE CANCER    . PROSTATE SURGERY     Family History  Problem Relation Age of Onset  . Colon cancer Mother   . Diabetes Other   . High blood pressure Other   . Prostate cancer Brother   . Diabetes Brother    Social History   Tobacco Use  . Smoking status: Former Smoker    Years: 16.00    Types: Cigarettes  . Smokeless tobacco: Never Used  Substance Use Topics  . Alcohol use: No   No Known Allergies  Prior to Admission medications   Medication Sig Start Date End Date Taking? Authorizing Provider  apixaban (ELIQUIS) 5 MG TABS tablet Take 1 tablet by mouth 2 (two) times daily. 12/03/17  Yes [provider]  atorvastatin (LIPITOR) 40 MG tablet Take 1 tablet (40 mg total) by mouth  daily at 6 PM. 10/02/17  Yes Boscia, Heather E, NP  carvedilol (COREG) 25 MG tablet Take 25 mg by mouth 2 (two) times daily with a meal.   Yes [provider]  dicyclomine (BENTYL) 10 MG capsule Take 2 capsules (20 mg total) by mouth 3 (three) times daily before meals. 02/24/18  Yes Vanga, Tally Due, MD  furosemide (LASIX) 20 MG tablet Take 1 tablet (20 mg total) by mouth daily. 12/31/17  Yes ,  A, FNP  glipiZIDE (GLUCOTROL XL) 2.5 MG 24 hr tablet Take 2.5 mg by mouth daily with breakfast.   Yes [provider]  hydrocortisone (ANUSOL-HC) 2.5 % rectal cream Place 1 application rectally 2 (two) times daily. 02/24/18  Yes Vanga, Tally Due, MD  nitroGLYCERIN (NITROSTAT) 0.4 MG SL tablet Place 1 tablet (0.4 mg total) under the tongue every 5 (five) minutes as needed for chest pain. 05/05/16  Yes Theodoro Grist, MD  [facility] VERIO test strip USE UTD TO CHECK BLOOD SUGAR ONCE DX E11.65 02/13/18  Yes Boscia, Heather E, NP  pantoprazole (PROTONIX) 40 MG tablet TAKE 1 TABLET(40 MG) BY MOUTH DAILY 01/26/18  Yes Ronnell Freshwater, NP    Review of Systems  Constitutional: Positive for fatigue. Negative for appetite change.  HENT: Negative for congestion, postnasal drip and sore throat.  Eyes: Negative.   Respiratory: Positive for cough (dry cough) and shortness of breath. Negative for chest tightness.   Cardiovascular: Negative for chest pain, palpitations and leg swelling.  Gastrointestinal: Negative for abdominal distention, abdominal pain, anal bleeding and blood in stool.  Endocrine: Negative.   Genitourinary: Negative.   Musculoskeletal: Positive for neck pain (knot for many years). Negative for back pain.  Skin: Negative.   Allergic/Immunologic: Negative.   Neurological: Negative for dizziness and light-headedness.  Hematological: Negative for adenopathy. Does not bruise/bleed easily.  Psychiatric/Behavioral: Negative for dysphoric mood and sleep disturbance. The patient  is not nervous/anxious.    Vitals:   03/12/18 0850  BP: 97/72  Pulse: (!) 121  Resp: (!) 22  Temp: 97.9 F (36.6 C)  TempSrc: Oral  SpO2: 96%  Weight: 255 lb 12.8 oz (116 kg)  Height: 6' (1.829 m)   Wt Readings from Last 3 Encounters:  03/12/18 255 lb 12.8 oz (116 kg)  02/24/18 250 lb 3.2 oz (113.5 kg)  02/16/18 244 lb (110.7 kg)   Lab Results  Component Value Date   CREATININE 2.06 (H) 02/15/2018   CREATININE 2.22 (H) 02/04/2018   CREATININE 2.84 (H) 08/28/2017    Physical Exam Vitals signs and nursing note reviewed.  Constitutional:      Appearance: He is well-developed.  HENT:     Head: Normocephalic and atraumatic.  Neck:     Musculoskeletal: Normal range of motion and neck supple.     Vascular: No JVD.  Cardiovascular:     Rate and Rhythm: Tachycardia present. Rhythm irregular.  Pulmonary:     Effort: Pulmonary effort is normal. No respiratory distress.     Breath sounds: No wheezing or rales.  Abdominal:     General: There is no distension.     Palpations: Abdomen is soft.  Musculoskeletal:     Right lower leg: He exhibits no tenderness. No edema.     Left lower leg: He exhibits no tenderness. No edema.  Skin:    General: Skin is warm and dry.  Neurological:     Mental Status: He is alert and oriented to person, place, and time.  Psychiatric:        Behavior: Behavior normal.    Assessment & Plan:  1: Chronic heart failure with reduced ejection fraction- - NYHA class II - euvolemic today - weighing daily and he was reminded to call for an overnight weight gain of >2 pounds or a weekly weight gain of >5 pounds - weight stable from last visit here 3 months ago - not adding salt and his wife has been reading food labels. Reminded to closely follow a 2000mg  sodium diet  - saw cardiology [name]) 12/03/17 - wearing compression socks daily - drinking ~ 40+ ounces of fluids daily - wearing oxygen at 2L at bedtime - BNP 08/06/17 was 149.0 - currently  participating in paramedicine program  - patient reports receiving his flu vaccine for this season  2: HTN- - BP on the low side today - saw PCP [name]) 02/06/2018 - BMP 02/15/2018 reviewed and showed sodium 141, potassium 4.4, creatinine 2.06 and GFR 34  3: DM- - glucose at home was 179 this morning - A1c 01/08/18 was 7.4%  4: Atrial fibrillation- - tachycardic today but has been bradycardic in the past - eliquis has been resumed after short stoppage for rectal bleeding  Medication bottles were reviewed.  Return in 6 months or sooner for any questions/problems before then.

## 2018-03-12 ENCOUNTER — Encounter: Payer: Self-pay | Admitting: Family

## 2018-03-12 ENCOUNTER — Other Ambulatory Visit: Payer: Self-pay

## 2018-03-12 ENCOUNTER — Ambulatory Visit: Payer: Medicare Other | Attending: Family | Admitting: Family

## 2018-03-12 VITALS — BP 97/72 | HR 121 | Temp 97.9°F | Resp 22 | Ht 72.0 in | Wt 255.8 lb

## 2018-03-12 DIAGNOSIS — Z79899 Other long term (current) drug therapy: Secondary | ICD-10-CM | POA: Diagnosis not present

## 2018-03-12 DIAGNOSIS — N183 Chronic kidney disease, stage 3 unspecified: Secondary | ICD-10-CM

## 2018-03-12 DIAGNOSIS — N189 Chronic kidney disease, unspecified: Secondary | ICD-10-CM | POA: Insufficient documentation

## 2018-03-12 DIAGNOSIS — M542 Cervicalgia: Secondary | ICD-10-CM | POA: Insufficient documentation

## 2018-03-12 DIAGNOSIS — R000 Tachycardia, unspecified: Secondary | ICD-10-CM | POA: Insufficient documentation

## 2018-03-12 DIAGNOSIS — R05 Cough: Secondary | ICD-10-CM | POA: Insufficient documentation

## 2018-03-12 DIAGNOSIS — I4891 Unspecified atrial fibrillation: Secondary | ICD-10-CM | POA: Insufficient documentation

## 2018-03-12 DIAGNOSIS — I10 Essential (primary) hypertension: Secondary | ICD-10-CM

## 2018-03-12 DIAGNOSIS — R0602 Shortness of breath: Secondary | ICD-10-CM | POA: Diagnosis not present

## 2018-03-12 DIAGNOSIS — E1122 Type 2 diabetes mellitus with diabetic chronic kidney disease: Secondary | ICD-10-CM | POA: Insufficient documentation

## 2018-03-12 DIAGNOSIS — R5383 Other fatigue: Secondary | ICD-10-CM | POA: Insufficient documentation

## 2018-03-12 DIAGNOSIS — Z7901 Long term (current) use of anticoagulants: Secondary | ICD-10-CM | POA: Insufficient documentation

## 2018-03-12 DIAGNOSIS — K625 Hemorrhage of anus and rectum: Secondary | ICD-10-CM | POA: Diagnosis not present

## 2018-03-12 DIAGNOSIS — Z7984 Long term (current) use of oral hypoglycemic drugs: Secondary | ICD-10-CM | POA: Diagnosis not present

## 2018-03-12 DIAGNOSIS — Z87891 Personal history of nicotine dependence: Secondary | ICD-10-CM | POA: Diagnosis not present

## 2018-03-12 DIAGNOSIS — I5022 Chronic systolic (congestive) heart failure: Secondary | ICD-10-CM | POA: Insufficient documentation

## 2018-03-12 DIAGNOSIS — I130 Hypertensive heart and chronic kidney disease with heart failure and stage 1 through stage 4 chronic kidney disease, or unspecified chronic kidney disease: Secondary | ICD-10-CM | POA: Diagnosis not present

## 2018-03-12 NOTE — Patient Instructions (Signed)
Continue weighing daily and call for an overnight weight gain of > 2 pounds or a weekly weight gain of >5 pounds. 

## 2018-03-19 ENCOUNTER — Ambulatory Visit: Payer: Medicare Other | Admitting: Podiatry

## 2018-03-20 ENCOUNTER — Other Ambulatory Visit: Payer: Self-pay

## 2018-03-20 MED ORDER — ATORVASTATIN CALCIUM 40 MG PO TABS: 40.0000 mg | ORAL_TABLET | Freq: Every day | ORAL | 5 refills | Status: DC

## 2018-03-21 DIAGNOSIS — G4733 Obstructive sleep apnea (adult) (pediatric): Secondary | ICD-10-CM | POA: Diagnosis not present

## 2018-03-27 ENCOUNTER — Encounter: Payer: Self-pay | Admitting: Emergency Medicine

## 2018-03-27 ENCOUNTER — Other Ambulatory Visit: Payer: Self-pay

## 2018-03-27 ENCOUNTER — Emergency Department
Admission: EM | Admit: 2018-03-27 | Discharge: 2018-03-27 | Disposition: A | Discharge status: Home / Self Care | Payer: Medicare Other | Attending: Emergency Medicine | Admitting: Emergency Medicine

## 2018-03-27 DIAGNOSIS — I130 Hypertensive heart and chronic kidney disease with heart failure and stage 1 through stage 4 chronic kidney disease, or unspecified chronic kidney disease: Secondary | ICD-10-CM | POA: Diagnosis not present

## 2018-03-27 DIAGNOSIS — Z87891 Personal history of nicotine dependence: Secondary | ICD-10-CM | POA: Insufficient documentation

## 2018-03-27 DIAGNOSIS — N183 Chronic kidney disease, stage 3 (moderate): Secondary | ICD-10-CM | POA: Insufficient documentation

## 2018-03-27 DIAGNOSIS — R040 Epistaxis: Secondary | ICD-10-CM | POA: Diagnosis not present

## 2018-03-27 DIAGNOSIS — Z79899 Other long term (current) drug therapy: Secondary | ICD-10-CM | POA: Insufficient documentation

## 2018-03-27 DIAGNOSIS — Z8546 Personal history of malignant neoplasm of prostate: Secondary | ICD-10-CM | POA: Insufficient documentation

## 2018-03-27 DIAGNOSIS — Z794 Long term (current) use of insulin: Secondary | ICD-10-CM | POA: Diagnosis not present

## 2018-03-27 DIAGNOSIS — E1122 Type 2 diabetes mellitus with diabetic chronic kidney disease: Secondary | ICD-10-CM | POA: Insufficient documentation

## 2018-03-27 DIAGNOSIS — I509 Heart failure, unspecified: Secondary | ICD-10-CM | POA: Diagnosis not present

## 2018-03-27 DIAGNOSIS — Z7901 Long term (current) use of anticoagulants: Secondary | ICD-10-CM | POA: Insufficient documentation

## 2018-03-27 MED ORDER — LIDOCAINE VISCOUS HCL 2 % MT SOLN
15.0000 mL | Freq: Once | OROMUCOSAL | Status: AC
  Administered 2018-03-27: 15 mL via OROMUCOSAL

## 2018-03-27 MED ORDER — OXYMETAZOLINE HCL 0.05 % NA SOLN
1.0000 | Freq: Once | NASAL | Status: AC
  Administered 2018-03-27: 1 via NASAL
  Filled 2018-03-27: qty 30

## 2018-03-27 NOTE — ED Notes (Signed)

## 2018-03-27 NOTE — ED Triage Notes (Signed)
Pt here for epistaxis starting 15 minutes ago.  Pt encouraged to hold tight firm pressure for 20 minutes without letting go.  Clamp applied.  Hx afib. On pradaxa. Ambulatory. NAD. VSS.  HR low 100s after ambulating to triage.  Decreased to 60-80s after sitting for while.  Airway intact.

## 2018-03-27 NOTE — ED Provider Notes (Signed)
[facility] Emergency Department Provider Note   ____________________________________________    I have reviewed the triage vital signs and the nursing notes.   HISTORY  Chief Complaint Epistaxis     HPI [name] is a 85 y.o. male who presents with complaints of nosebleed.  Patient has a history of atrial fibrillation he is on Eliquis.  Reports nosebleed started about 15 minutes prior to arrival.  Has not take anything for this.  No injury to the nose.  No nausea or vomiting.  No history of frequent nosebleeds  Past Medical History:  Diagnosis Date  . Atrial fibrillation ([place])   . CHF (congestive heart failure) ([place])   . Diabetes ([place])   . Hearing loss   . High blood pressure   . History of bladder problems   . Kidney stones 11/22/2014    Patient Active Problem List   Diagnosis Date Noted  . Primary osteoarthritis of left hip 02/12/2018  . Bilateral hearing loss due to cerumen impaction 10/06/2017  . Chronic systolic heart failure ([place]) 08/19/2017  . Atrial fibrillation with RVR ([place]) 08/02/2017  . Acute CHF (congestive heart failure) ([place]) 08/02/2017  . Prostatic hypertrophy 07/20/2017  . Hyperpigmentation 04/10/2017  . Uncontrolled type 2 diabetes mellitus with hyperglycemia ([place]) 03/24/2017  . Encounter for general adult medical examination with abnormal findings 03/24/2017  . Chest pain 05/04/2016  . Elevated troponin 05/04/2016  . Essential hypertension 07/06/2014  . Cough 03/21/2014  . OSA (obstructive sleep apnea) 03/21/2014  . Paroxysmal A-fib ([place]) 03/21/2014  . Hx of adenomatous colonic polyps 06/28/2013  . Liver cyst 06/28/2013  . Chronic kidney disease 06/08/2013  . Type 2 diabetes mellitus with stage 3 chronic kidney disease, without long-term current use of insulin ([place]) 06/08/2013  . Hyperlipidemia, mixed 06/08/2013  . Valvular heart disease 06/08/2013    Past Surgical History:  Procedure Laterality Date  .  CATARACT EXTRACTION    . PROSTATE CANCER    . PROSTATE SURGERY      Prior to Admission medications   Medication Sig Start Date End Date Taking? Authorizing Provider  apixaban (ELIQUIS) 5 MG TABS tablet Take 1 tablet by mouth 2 (two) times daily. 12/03/17   [provider]  atorvastatin (LIPITOR) 40 MG tablet Take 1 tablet (40 mg total) by mouth daily at 6 PM. 03/20/18   Boscia, Greer Ee, NP  carvedilol (COREG) 25 MG tablet Take 25 mg by mouth 2 (two) times daily with a meal.    [provider]  dicyclomine (BENTYL) 10 MG capsule Take 2 capsules (20 mg total) by mouth 3 (three) times daily before meals. 02/24/18   Lin Landsman, MD  furosemide (LASIX) 20 MG tablet Take 1 tablet (20 mg total) by mouth daily. 12/31/17   Alisa Graff, FNP  glipiZIDE (GLUCOTROL XL) 2.5 MG 24 hr tablet Take 2.5 mg by mouth daily with breakfast.    [provider]  hydrocortisone (ANUSOL-HC) 2.5 % rectal cream Place 1 application rectally 2 (two) times daily. 02/24/18   Lin Landsman, MD  nitroGLYCERIN (NITROSTAT) 0.4 MG SL tablet Place 1 tablet (0.4 mg total) under the tongue every 5 (five) minutes as needed for chest pain. 05/05/16   Theodoro Grist, MD  [facility] VERIO test strip USE UTD TO CHECK BLOOD SUGAR ONCE DX E11.65 02/13/18   Ronnell Freshwater, NP  pantoprazole (PROTONIX) 40 MG tablet TAKE 1 TABLET(40 MG) BY MOUTH DAILY 01/26/18   Ronnell Freshwater,  NP     Allergies Patient has no known allergies.  Family History  Problem Relation Age of Onset  . Colon cancer Mother   . Diabetes Other   . High blood pressure Other   . Prostate cancer Brother   . Diabetes Brother     Social History Social History   Tobacco Use  . Smoking status: Former Smoker    Years: 16.00    Types: Cigarettes  . Smokeless tobacco: Never Used  Substance Use Topics  . Alcohol use: No  . Drug use: No    Review of Systems  Constitutional: No fever/chills Eyes: No visual changes.  ENT:  No epistaxis Cardiovascular: Denies chest pain. Respiratory: Denies shortness of breath. Gastrointestinal: No abdominal pain.  No nausea, no vomiting.   .  Neurological: Negative for headaches    ____________________________________________   PHYSICAL EXAM:  VITAL SIGNS: ED Triage Vitals  Enc Vitals Group     BP 03/27/18 0922 (!) 129/94     Pulse Rate 03/27/18 0922 (!) 109     Resp 03/27/18 0922 (!) 24     Temp 03/27/18 1054 97.7 F (36.5 C)     Temp Source 03/27/18 1054 Axillary     SpO2 03/27/18 0922 98 %     Weight 03/27/18 0925 111.1 kg (245 lb)     Height 03/27/18 0925 1.829 m (6')     Head Circumference --      Peak Flow --      Pain Score 03/27/18 0925 0     Pain Loc --      Pain Edu? --      Excl. in [place]? --     Constitutional: Alert and oriented Eyes: Conjunctivae are normal.  Head: Atraumatic. Nose: Mild epistaxis left nare, no swelling Throat: No significant amount of blood in the posterior pharynx Cardiovascular: Irregular rhythm. Grossly normal heart sounds.  Good peripheral circulation. Respiratory: Normal respiratory effort.  No retractions.  Gastrointestinal: Soft and nontender. No distention.    Musculoskeletal:   Warm and well perfused Neurologic:  Normal speech and language. No gross focal neurologic deficits are appreciated.  Skin:  Skin is warm, dry and intact. No rash noted.   ____________________________________________   LABS (all labs ordered are listed, but only abnormal results are displayed)  Labs Reviewed - No data to display ____________________________________________  EKG  None ____________________________________________  RADIOLOGY  None ____________________________________________   PROCEDURES  Procedure(s) performed: No  Procedures   Critical Care performed: No ____________________________________________   INITIAL IMPRESSION / ASSESSMENT AND PLAN / ED COURSE  Pertinent labs & imaging results that were  available during my care of the patient were reviewed by me and considered in my medical decision making (see chart for details).  Patient well-appearing and in no acute distress.  Instructed the patient to hold pressure while we prepared Afrin, viscous lidocaine in case packing was required.  While we did this the patient's bleeding stopped spontaneously.  Observed in the emergency department for another 20 minutes, no further bleeding appropriate for discharge with outpatient follow-up as needed    ____________________________________________   FINAL CLINICAL IMPRESSION(S) / ED DIAGNOSES  Final diagnoses:  Epistaxis        Note:  This document was prepared using Dragon voice recognition software and may include unintentional dictation errors.   Lavonia Drafts, MD 03/27/18 1218

## 2018-03-31 ENCOUNTER — Ambulatory Visit: Payer: Medicare Other | Admitting: Gastroenterology

## 2018-04-02 ENCOUNTER — Other Ambulatory Visit (HOSPITAL_COMMUNITY): Payer: Self-pay

## 2018-04-03 ENCOUNTER — Encounter (HOSPITAL_COMMUNITY): Payer: Self-pay

## 2018-04-03 NOTE — Progress Notes (Signed)
Today [name] states been doing well.  He had a nose beed at work and they took him to ER but it is fine now.  Advised him he could use humidified oxygen at night might help.  Advised him how to get a nose bleed to stop.  He denies chest pain, shortness of breath, dizziness or headaches.  Appetite is good.  His wife watches what he eats.  He has all his meds, they were verified.  He places them in med box.  He is aware of why he takes them, how much and when.  He has tall socks on, no swelling in extremities.  He states sleeps well, wears oxygen when sleeps.  He stays active, works around 36 hours a week.  Will continue to visit for heart failure.   [place] [phone]

## 2018-04-07 ENCOUNTER — Ambulatory Visit (INDEPENDENT_AMBULATORY_CARE_PROVIDER_SITE_OTHER): Payer: Medicare Other | Admitting: Nurse Practitioner

## 2018-04-07 ENCOUNTER — Encounter: Payer: Self-pay | Admitting: Nurse Practitioner

## 2018-04-07 VITALS — BP 113/74 | HR 79 | Resp 16 | Ht 72.0 in | Wt 256.0 lb

## 2018-04-07 DIAGNOSIS — E1165 Type 2 diabetes mellitus with hyperglycemia: Secondary | ICD-10-CM

## 2018-04-07 DIAGNOSIS — I10 Essential (primary) hypertension: Secondary | ICD-10-CM

## 2018-04-07 DIAGNOSIS — I480 Paroxysmal atrial fibrillation: Secondary | ICD-10-CM | POA: Diagnosis not present

## 2018-04-07 LAB — POCT GLYCOSYLATED HEMOGLOBIN (HGB A1C): Hemoglobin A1C: 8.4 % — AB (ref 4.0–5.6)

## 2018-04-07 MED ORDER — SEMAGLUTIDE(0.25 OR 0.5MG/DOS) 2 MG/1.5ML ~~LOC~~ SOPN: 0.5000 mg | PEN_INJECTOR | SUBCUTANEOUS | 5 refills | Status: DC

## 2018-04-07 NOTE — Progress Notes (Signed)
[facility], [place] [address]  Internal MEDICINE  Office Visit Note  Patient Name: [name]  [id number]  [id number]  Date of Service: 04/23/2018  Chief Complaint  Patient presents with  . Diabetes  . Hypertension    His blood sugars have been elevated recently. Mostly in the mid to high 100s. Patient's wife states that he often "cheats" on his diet. Does take his glipizide every day. Blood pressures are well controlled. No new concerns or complaints to consider today.       Current Medication: Outpatient Encounter Medications as of 04/07/2018  Medication Sig Note  . apixaban (ELIQUIS) 5 MG TABS tablet Take 1 tablet by mouth 2 (two) times daily.   . carvedilol (COREG) 25 MG tablet Take 25 mg by mouth 2 (two) times daily with a meal.   . furosemide (LASIX) 20 MG tablet Take 1 tablet (20 mg total) by mouth daily.   . nitroGLYCERIN (NITROSTAT) 0.4 MG SL tablet Place 1 tablet (0.4 mg total) under the tongue every 5 (five) minutes as needed for chest pain. (Patient not taking: Reported on 04/02/2018)   . pantoprazole (PROTONIX) 40 MG tablet TAKE 1 TABLET(40 MG) BY MOUTH DAILY   . Semaglutide,0.25 or 0.5MG /DOS, (OZEMPIC, 0.25 OR 0.5 MG/DOSE,) 2 MG/1.5ML SOPN Inject 0.5 mg into the skin once a week.   . [DISCONTINUED] atorvastatin (LIPITOR) 40 MG tablet Take 1 tablet (40 mg total) by mouth daily at 6 PM.   . [DISCONTINUED] ELIQUIS 5 MG TABS tablet    . [DISCONTINUED] glipiZIDE (GLUCOTROL XL) 2.5 MG 24 hr tablet TAKE 1 TABLET(2.5 MG) BY MOUTH DAILY WITH BREAKFAST   . [DISCONTINUED] glipiZIDE (GLUCOTROL XL) 5 MG 24 hr tablet Take 1 tablet (5 mg total) by mouth daily with breakfast.   . [DISCONTINUED] ONETOUCH VERIO test strip USE UTD TO CHECK BLOOD SUGAR ONCE D   . [DISCONTINUED] PRADAXA 150 MG CAPS capsule Take 150 mg by mouth 2 (two) times daily. 02/12/2018: per cardiology.    No facility-administered encounter medications on file as of 04/07/2018.      Surgical History: Past Surgical History:  Procedure Laterality Date  . CATARACT EXTRACTION    . PROSTATE CANCER    . PROSTATE SURGERY      Medical History: Past Medical History:  Diagnosis Date  . Atrial fibrillation ([place])   . CHF (congestive heart failure) ([place])   . Diabetes ([place])   . Hearing loss   . High blood pressure   . History of bladder problems   . Kidney stones 11/22/2014    Family History: Family History  Problem Relation Age of Onset  . Colon cancer Mother   . Diabetes Other   . High blood pressure Other   . Prostate cancer Brother   . Diabetes Brother     Social History   Socioeconomic History  . Marital status: Married    Spouse name: Not on file  . Number of children: Not on file  . Years of education: Not on file  . Highest education level: Not on file  Occupational History  . 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 Financial resource strain: Not hard at all  . Food insecurity:    Worry: Never true    Inability: Never true  . Transportation needs:    Medical: No    Non-medical: No  Tobacco Use  . Smoking status: Former Smoker    Years: 16.00    Types: Cigarettes  .  Smokeless tobacco: Never Used  Substance and Sexual Activity  . Alcohol use: No  . Drug use: No  . Sexual activity: Not Currently  Lifestyle  . Physical activity:    Days per week: 0 days    Minutes per session: 0 min  . Stress: Not at all  Relationships  . Social connections:    Talks on phone: Twice a week    Gets together: Never    Attends religious service: 1 to 4 times per year    Active member of club or organization: No    Attends meetings of clubs or organizations: Never    Relationship status: Married  . Intimate partner violence:    Fear of current or ex partner: No    Emotionally abused: No    Physically abused: No    Forced sexual activity: No  Other Topics Concern  . Not on file  Social History Narrative  . Not on file      Review of Systems   Constitutional: Negative for activity change, chills, fatigue and unexpected weight change.  HENT: Negative for congestion, postnasal drip, rhinorrhea, sneezing, sore throat and voice change.   Respiratory: Negative for cough, chest tightness, shortness of breath and wheezing.   Cardiovascular: Negative for chest pain and palpitations.  Gastrointestinal: Negative for abdominal pain, constipation, diarrhea, nausea and vomiting.  Endocrine: Negative for cold intolerance, heat intolerance, polydipsia and polyuria.       Worsening blood sugars.   Genitourinary: Negative for dysuria and frequency.  Musculoskeletal: Negative for arthralgias, back pain, joint swelling and neck pain.  Skin: Negative for rash.  Allergic/Immunologic: Negative for environmental allergies.  Neurological: Negative for dizziness, tremors, numbness and headaches.  Hematological: Negative for adenopathy. Does not bruise/bleed easily.  Psychiatric/Behavioral: Negative for behavioral problems (Depression), sleep disturbance and suicidal ideas. The patient is not nervous/anxious.     Today's Vitals   04/07/18 0937  BP: 113/74  Pulse: 79  Resp: 16  SpO2: 95%  Weight: 256 lb (116.1 kg)  Height: 6' (1.829 m)   Body mass index is 34.72 kg/m.  Physical Exam Vitals signs and nursing note reviewed.  Constitutional:      General: He is not in acute distress.    Appearance: Normal appearance. He is well-developed. He is not diaphoretic.  HENT:     Head: Normocephalic and atraumatic.     Nose: Nose normal.     Mouth/Throat:     Pharynx: No oropharyngeal exudate.  Eyes:     Conjunctiva/sclera: Conjunctivae normal.     Pupils: Pupils are equal, round, and reactive to light.  Neck:     Musculoskeletal: Normal range of motion and neck supple.     Thyroid: No thyromegaly.     Vascular: No carotid bruit or JVD.     Trachea: No tracheal deviation.  Cardiovascular:     Rate and Rhythm: Normal rate. Rhythm irregular.      Pulses:          Dorsalis pedis pulses are 1+ on the right side and 1+ on the left side.       Posterior tibial pulses are 1+ on the right side and 1+ on the left side.     Heart sounds: Murmur present. No friction rub. No gallop.      Comments: Irregular heart rhythm with soft, blowing murmur. Pulmonary:     Effort: Pulmonary effort is normal. No respiratory distress.     Breath sounds: Normal breath sounds. No wheezing  or rales.  Chest:     Chest wall: No tenderness.  Abdominal:     General: Bowel sounds are normal.     Palpations: Abdomen is soft.     Tenderness: There is no abdominal tenderness.  Musculoskeletal: Normal range of motion.     Right foot: Normal range of motion. No deformity.     Left foot: Normal range of motion. No deformity.  Feet:     Right foot:     Protective Sensation: 7 sites tested. 7 sites sensed.     Skin integrity: Dry skin present. No skin breakdown.     Left foot:     Protective Sensation: 7 sites tested. 7 sites sensed.     Skin integrity: Dry skin present. No skin breakdown.  Lymphadenopathy:     Cervical: No cervical adenopathy.  Skin:    General: Skin is warm and dry.     Capillary Refill: Capillary refill takes less than 2 seconds.  Neurological:     Mental Status: He is alert and oriented to person, place, and time. Mental status is at baseline.     Cranial Nerves: No cranial nerve deficit.     Sensory: No sensory deficit.  Psychiatric:        Mood and Affect: Mood normal.        Behavior: Behavior normal.        Thought Content: Thought content normal.        Judgment: Judgment normal.    Assessment/Plan: 1. Uncontrolled type 2 diabetes mellitus with hyperglycemia (HCC) - POCT HgB A1C 8.4 today, up from 7.4 at most recent check. Started patient on Ozempic today. Instructions and demonstration provided in the office. First dose given in the office. Sample provided. Should dose with 0.25mg  weekly for next 3 weeks, then increase to 0.5mg   weekly. New prescription sent to his pharmacy. D/c glipizide. - Semaglutide,0.25 or 0.5MG /DOS, (OZEMPIC, 0.25 OR 0.5 MG/DOSE,) 2 MG/1.5ML SOPN; Inject 0.5 mg into the skin once a week.  Dispense: 1 pen; Refill: 5  2. Essential hypertension Stable. Continue bp medication as prescribed   3. Paroxysmal A-fib ([place]) Continue regular visits with cardiology as scheduled.   General Counseling: benjerman molinelli understanding of the findings of todays visit and agrees with plan of treatment. I have discussed any further diagnostic evaluation that may be needed or ordered today. We also reviewed his medications today. he has been encouraged to call the office with any questions or concerns that should arise related to todays visit.  Diabetes Counseling:  1. Addition of ACE inh/ ARB'S for nephroprotection. Microalbumin is updated  2. Diabetic foot care, prevention of complications. Podiatry consult 3. Exercise and lose weight.  4. Diabetic eye examination, Diabetic eye exam is updated  5. Monitor blood sugar closlely. nutrition counseling.  6. Sign and symptoms of hypoglycemia including shaking sweating,confusion and headaches.  This patient was seen by Leretha Pol FNP Collaboration with Dr Lavera Guise as a part of collaborative care agreement  Orders Placed This Encounter  Procedures  . POCT HgB A1C    Meds ordered this encounter  Medications  . Semaglutide,0.25 or 0.5MG /DOS, (OZEMPIC, 0.25 OR 0.5 MG/DOSE,) 2 MG/1.5ML SOPN    Sig: Inject 0.5 mg into the skin once a week.    Dispense:  1 pen    Refill:  5    Sample provided today.    Order Specific Question:   Supervising Provider    Answer:   Lavera Guise [[id number]]  Time spent: 81 Minutes      Dr Lavera Guise Internal medicine

## 2018-04-10 ENCOUNTER — Encounter: Payer: Self-pay | Admitting: Emergency Medicine

## 2018-04-10 ENCOUNTER — Other Ambulatory Visit: Payer: Self-pay

## 2018-04-10 ENCOUNTER — Emergency Department: Payer: Medicare Other

## 2018-04-10 ENCOUNTER — Emergency Department
Admission: EM | Admit: 2018-04-10 | Discharge: 2018-04-10 | Disposition: A | Discharge status: Home / Self Care | Payer: Medicare Other | Attending: Student in an Organized Health Care Education/Training Program | Admitting: Student in an Organized Health Care Education/Training Program

## 2018-04-10 DIAGNOSIS — R05 Cough: Secondary | ICD-10-CM | POA: Diagnosis not present

## 2018-04-10 DIAGNOSIS — Z7984 Long term (current) use of oral hypoglycemic drugs: Secondary | ICD-10-CM | POA: Insufficient documentation

## 2018-04-10 DIAGNOSIS — Z87891 Personal history of nicotine dependence: Secondary | ICD-10-CM | POA: Insufficient documentation

## 2018-04-10 DIAGNOSIS — I4891 Unspecified atrial fibrillation: Secondary | ICD-10-CM

## 2018-04-10 DIAGNOSIS — Z79899 Other long term (current) drug therapy: Secondary | ICD-10-CM | POA: Insufficient documentation

## 2018-04-10 DIAGNOSIS — N183 Chronic kidney disease, stage 3 (moderate): Secondary | ICD-10-CM | POA: Insufficient documentation

## 2018-04-10 DIAGNOSIS — I5021 Acute systolic (congestive) heart failure: Secondary | ICD-10-CM | POA: Diagnosis not present

## 2018-04-10 DIAGNOSIS — E1122 Type 2 diabetes mellitus with diabetic chronic kidney disease: Secondary | ICD-10-CM | POA: Diagnosis not present

## 2018-04-10 DIAGNOSIS — I130 Hypertensive heart and chronic kidney disease with heart failure and stage 1 through stage 4 chronic kidney disease, or unspecified chronic kidney disease: Secondary | ICD-10-CM | POA: Diagnosis not present

## 2018-04-10 DIAGNOSIS — R059 Cough, unspecified: Secondary | ICD-10-CM

## 2018-04-10 DIAGNOSIS — Z7901 Long term (current) use of anticoagulants: Secondary | ICD-10-CM | POA: Insufficient documentation

## 2018-04-10 LAB — CBC
HCT: 43.9 % (ref 39.0–52.0)
Hemoglobin: 13.6 g/dL (ref 13.0–17.0)
MCH: 28.5 pg (ref 26.0–34.0)
MCHC: 31 g/dL (ref 30.0–36.0)
MCV: 91.8 fL (ref 80.0–100.0)
Platelets: 148 10*3/uL — ABNORMAL LOW (ref 150–400)
RBC: 4.78 MIL/uL (ref 4.22–5.81)
RDW: 14.7 % (ref 11.5–15.5)
WBC: 4.3 10*3/uL (ref 4.0–10.5)
nRBC: 0 % (ref 0.0–0.2)

## 2018-04-10 LAB — BASIC METABOLIC PANEL
Anion gap: 9 (ref 5–15)
BUN: 40 mg/dL — ABNORMAL HIGH (ref 8–23)
CO2: 24 mmol/L (ref 22–32)
Calcium: 9.2 mg/dL (ref 8.9–10.3)
Chloride: 106 mmol/L (ref 98–111)
Creatinine, Ser: 2.4 mg/dL — ABNORMAL HIGH (ref 0.61–1.24)
GFR calc Af Amer: 28 mL/min — ABNORMAL LOW (ref >60)
GFR calc non Af Amer: 24 mL/min — ABNORMAL LOW (ref >60)
Glucose, Bld: 100 mg/dL — ABNORMAL HIGH (ref 70–99)
Potassium: 4.3 mmol/L (ref 3.5–5.1)
Sodium: 139 mmol/L (ref 135–145)

## 2018-04-10 LAB — PROTIME-INR
INR: 1.3 — ABNORMAL HIGH (ref 0.8–1.2)
Prothrombin Time: 16 seconds — ABNORMAL HIGH (ref 11.4–15.2)

## 2018-04-10 MED ORDER — AZITHROMYCIN 500 MG PO TABS: 500.0000 mg | ORAL_TABLET | Freq: Every day | ORAL | 0 refills | Status: AC

## 2018-04-10 MED ORDER — AZITHROMYCIN 500 MG PO TABS
500.0000 mg | ORAL_TABLET | Freq: Once | ORAL | Status: AC
  Administered 2018-04-10: 500 mg via ORAL
  Filled 2018-04-10: qty 1

## 2018-04-10 MED ORDER — CARVEDILOL 6.25 MG PO TABS
6.2500 mg | ORAL_TABLET | Freq: Once | ORAL | Status: AC
  Administered 2018-04-10: 6.25 mg via ORAL
  Filled 2018-04-10: qty 1

## 2018-04-10 MED ORDER — SODIUM CHLORIDE 0.9 % IV BOLUS
250.0000 mL | Freq: Once | INTRAVENOUS | Status: AC
  Administered 2018-04-10: 250 mL via INTRAVENOUS

## 2018-04-10 MED ORDER — ALBUTEROL SULFATE (2.5 MG/3ML) 0.083% IN NEBU
2.5000 mg | INHALATION_SOLUTION | Freq: Once | RESPIRATORY_TRACT | Status: AC
  Administered 2018-04-10: 2.5 mg via RESPIRATORY_TRACT
  Filled 2018-04-10: qty 3

## 2018-04-10 MED ORDER — MAGNESIUM SULFATE 2 GM/50ML IV SOLN
2.0000 g | Freq: Once | INTRAVENOUS | Status: AC
  Administered 2018-04-10: 2 g via INTRAVENOUS
  Filled 2018-04-10: qty 50

## 2018-04-10 NOTE — ED Notes (Signed)
Per pharm, hold albuterol as it will be exchanged for a different medicine.

## 2018-04-10 NOTE — ED Provider Notes (Signed)
[facility] Emergency Department Provider Note    First MD Initiated Contact with Patient 04/10/18 1352     (approximate)  I have reviewed the triage vital signs and the nursing notes.   HISTORY  Chief Complaint Cough    HPI [name] is a 85 y.o. male the below listed past medical history presents the ER for persistent cough for the past several days.  Denies any chest pain.  Denies any shortness of breath at this time.  No fevers.  Has had some congestion.  Has been compliant to his medications.  No nausea vomiting or diarrhea.    Past Medical History:  Diagnosis Date  . Atrial fibrillation ([place])   . CHF (congestive heart failure) ([place])   . Diabetes ([place])   . Hearing loss   . High blood pressure   . History of bladder problems   . Kidney stones 11/22/2014   Family History  Problem Relation Age of Onset  . Colon cancer Mother   . Diabetes Other   . High blood pressure Other   . Prostate cancer Brother   . Diabetes Brother    Past Surgical History:  Procedure Laterality Date  . CATARACT EXTRACTION    . PROSTATE CANCER    . PROSTATE SURGERY     Patient Active Problem List   Diagnosis Date Noted  . Primary osteoarthritis of left hip 02/12/2018  . Bilateral hearing loss due to cerumen impaction 10/06/2017  . Chronic systolic heart failure ([place]) 08/19/2017  . Atrial fibrillation with RVR ([place]) 08/02/2017  . Acute CHF (congestive heart failure) ([place]) 08/02/2017  . Prostatic hypertrophy 07/20/2017  . Hyperpigmentation 04/10/2017  . Uncontrolled type 2 diabetes mellitus with hyperglycemia ([place]) 03/24/2017  . Encounter for general adult medical examination with abnormal findings 03/24/2017  . Chest pain 05/04/2016  . Elevated troponin 05/04/2016  . Essential hypertension 07/06/2014  . Cough 03/21/2014  . OSA (obstructive sleep apnea) 03/21/2014  . Paroxysmal A-fib ([place]) 03/21/2014  . Hx of adenomatous colonic polyps 06/28/2013  .  Liver cyst 06/28/2013  . Chronic kidney disease 06/08/2013  . Type 2 diabetes mellitus with stage 3 chronic kidney disease, without long-term current use of insulin ([place]) 06/08/2013  . Hyperlipidemia, mixed 06/08/2013  . Valvular heart disease 06/08/2013      Prior to Admission medications   Medication Sig Start Date End Date Taking? Authorizing Provider  apixaban (ELIQUIS) 5 MG TABS tablet Take 1 tablet by mouth 2 (two) times daily. 12/03/17   [provider]  atorvastatin (LIPITOR) 40 MG tablet Take 1 tablet (40 mg total) by mouth daily at 6 PM. 03/20/18   Boscia, Greer Ee, NP  azithromycin (ZITHROMAX) 500 MG tablet Take 1 tablet (500 mg total) by mouth daily for 3 days. Take 1 tablet daily for 3 days. 04/10/18 04/13/18  Merlyn Lot, MD  carvedilol (COREG) 25 MG tablet Take 25 mg by mouth 2 (two) times daily with a meal.    [provider]  dicyclomine (BENTYL) 10 MG capsule Take 2 capsules (20 mg total) by mouth 3 (three) times daily before meals. Patient not taking: Reported on 04/02/2018 02/24/18   Lin Landsman, MD  furosemide (LASIX) 20 MG tablet Take 1 tablet (20 mg total) by mouth daily. 12/31/17   Alisa Graff, FNP  glipiZIDE (GLUCOTROL XL) 2.5 MG 24 hr tablet Take 2.5 mg by mouth daily with breakfast.    [provider]  hydrocortisone (ANUSOL-HC) 2.5 % rectal  cream Place 1 application rectally 2 (two) times daily. Patient not taking: Reported on 04/02/2018 02/24/18   Lin Landsman, MD  nitroGLYCERIN (NITROSTAT) 0.4 MG SL tablet Place 1 tablet (0.4 mg total) under the tongue every 5 (five) minutes as needed for chest pain. Patient not taking: Reported on 04/02/2018 05/05/16   Theodoro Grist, MD  [facility] VERIO test strip USE UTD TO CHECK BLOOD SUGAR ONCE DX E11.65 02/13/18   Ronnell Freshwater, NP  pantoprazole (PROTONIX) 40 MG tablet TAKE 1 TABLET(40 MG) BY MOUTH DAILY 01/26/18   Ronnell Freshwater, NP  Semaglutide,0.25 or 0.5MG /DOS, (OZEMPIC,  0.25 OR 0.5 MG/DOSE,) 2 MG/1.5ML SOPN Inject 0.5 mg into the skin once a week. 04/07/18   Ronnell Freshwater, NP    Allergies Patient has no known allergies.    Social History Social History   Tobacco Use  . Smoking status: Former Smoker    Years: 16.00    Types: Cigarettes  . Smokeless tobacco: Never Used  Substance Use Topics  . Alcohol use: No  . Drug use: No    Review of Systems Patient denies headaches, rhinorrhea, blurry vision, numbness, shortness of breath, chest pain, edema, cough, abdominal pain, nausea, vomiting, diarrhea, dysuria, fevers, rashes or hallucinations unless otherwise stated above in HPI. ____________________________________________   PHYSICAL EXAM:  VITAL SIGNS: Vitals:   04/10/18 1330 04/10/18 1430  BP: 117/90 123/89  Pulse:  83  Resp: 18 (!) 21  Temp:    SpO2:  96%    Constitutional: Alert and oriented.  Eyes: Conjunctivae are normal.  Head: Atraumatic. Nose: No congestion/rhinnorhea. Mouth/Throat: Mucous membranes are moist.   Neck: No stridor. Painless ROM.  Cardiovascular: tachycardic irregularly irregular rhythm. Grossly normal heart sounds.  Good peripheral circulation. Respiratory: Normal respiratory effort.  No retractions. Lungs CTAB. Gastrointestinal: Soft and nontender. No distention. No abdominal bruits. No CVA tenderness. Genitourinary:  Musculoskeletal: No lower extremity tenderness nor edema.  No joint effusions. Neurologic:  Normal speech and language. No gross focal neurologic deficits are appreciated. No facial droop Skin:  Skin is warm, dry and intact. No rash noted. Psychiatric: Mood and affect are normal. Speech and behavior are normal.  ____________________________________________   LABS (all labs ordered are listed, but only abnormal results are displayed)  Results for orders placed or performed during the hospital encounter of 04/10/18 (from the past 24 hour(s))  Basic metabolic panel     Status: Abnormal    Collection Time: 04/10/18  1:09 PM  Result Value Ref Range   Sodium 139 135 - 145 mmol/L   Potassium 4.3 3.5 - 5.1 mmol/L   Chloride 106 98 - 111 mmol/L   CO2 24 22 - 32 mmol/L   Glucose, Bld 100 (H) 70 - 99 mg/dL   BUN 40 (H) 8 - 23 mg/dL   Creatinine, Ser 2.40 (H) 0.61 - 1.24 mg/dL   Calcium 9.2 8.9 - 10.3 mg/dL   GFR calc non Af Amer 24 (L) >60 mL/min   GFR calc Af Amer 28 (L) >60 mL/min   Anion gap 9 5 - 15  CBC     Status: Abnormal   Collection Time: 04/10/18  1:09 PM  Result Value Ref Range   WBC 4.3 4.0 - 10.5 K/uL   RBC 4.78 4.22 - 5.81 MIL/uL   Hemoglobin 13.6 13.0 - 17.0 g/dL   HCT 43.9 39.0 - 52.0 %   MCV 91.8 80.0 - 100.0 fL   MCH 28.5 26.0 - 34.0 pg  MCHC 31.0 30.0 - 36.0 g/dL   RDW 14.7 11.5 - 15.5 %   Platelets 148 (L) 150 - 400 K/uL   nRBC 0.0 0.0 - 0.2 %  Protime-INR- (order if Patient is taking Coumadin / Warfarin)     Status: Abnormal   Collection Time: 04/10/18  1:09 PM  Result Value Ref Range   Prothrombin Time 16.0 (H) 11.4 - 15.2 seconds   INR 1.3 (H) 0.8 - 1.2   ____________________________________________  EKG My review and personal interpretation at Time: 12:59   Indication: cough  Rate: 115  Rhythm: afib with rvr Axis: left Other: rbbb, nonspecific st abn, n stemi ____________________________________________  RADIOLOGY  I personally reviewed all radiographic images ordered to evaluate for the above acute complaints and reviewed radiology reports and findings.  These findings were personally discussed with the patient.  Please see medical record for radiology report.  ____________________________________________   PROCEDURES  Procedure(s) performed:  Procedures    Critical Care performed: no ____________________________________________   INITIAL IMPRESSION / ASSESSMENT AND PLAN / ED COURSE  Pertinent labs & imaging results that were available during my care of the patient were reviewed by me and considered in my medical decision  making (see chart for details).   DDX: Dysrhythmia, bronchitis, pneumonia, CHF, electrolyte abnormality  [name] is a 85 y.o. who presents to the ED with symptoms as described above.  Patient nontoxic-appearing.  Has mildly elevated heart rate but will give IV magnesium as well as IV fluids.  Does not seem like COPD exacerbation.  Chest x-ray does not show any evidence of clear infiltrate.  Given cough and productive sputum will start on Zithromax.  He is not febrile with no white count.  Does not seem like influenza.  Does not meet testing criteria for covid 19 based on [employer].  Clinical Course as of Apr 10 1503  Fri Apr 10, 2018  1425 Patient without any significant wheezing.  No evidence of infiltrate.  Will treat for bronchitis.  A. fib is currently rate controlled.  Will write for azithromycin.  He denies any pain or discomfort.  He is early rate controlled on his home medications heart rate in the 80s to 90s.  He will move around and his heart rate will go to the 1 teens but then when he is resting it is down in the 80s.  Do believe he stable and appropriate for outpatient follow-up.   [PR]    Clinical Course User Index [PR] Merlyn Lot, MD     As part of my medical decision making, I reviewed the following data within the [place] notes reviewed and incorporated, Labs reviewed, notes from prior ED visits and [place] Controlled Substance Database   ____________________________________________   FINAL CLINICAL IMPRESSION(S) / ED DIAGNOSES  Final diagnoses:  Cough  Atrial fibrillation with rapid ventricular response (HCC)      NEW MEDICATIONS STARTED DURING THIS VISIT:  New Prescriptions   AZITHROMYCIN (ZITHROMAX) 500 MG TABLET    Take 1 tablet (500 mg total) by mouth daily for 3 days. Take 1 tablet daily for 3 days.     Note:  This document was prepared using Dragon voice recognition software and may include unintentional  dictation errors.    Merlyn Lot, MD 04/10/18 [phone]

## 2018-04-10 NOTE — ED Notes (Signed)
Pt sent with meal tray.

## 2018-04-10 NOTE — ED Notes (Signed)
Patient transported to X-ray 

## 2018-04-10 NOTE — ED Notes (Signed)
ED Provider at bedside. 

## 2018-04-10 NOTE — ED Triage Notes (Signed)
Pt to ED from home c/o cough, sometimes dark productive sputum, denies pain.  Pt found to be in a/fib in triage with hx of a.fib.  Presents A&Ox4, speaking in complete and coherent sentences, appears in NAD at this time.  States on Eliquis.

## 2018-04-12 ENCOUNTER — Other Ambulatory Visit: Payer: Self-pay

## 2018-04-12 ENCOUNTER — Emergency Department
Admission: EM | Admit: 2018-04-12 | Discharge: 2018-04-13 | Disposition: A | Discharge status: Home / Self Care | Payer: Medicare Other | Attending: Emergency Medicine | Admitting: Emergency Medicine

## 2018-04-12 ENCOUNTER — Encounter: Payer: Self-pay | Admitting: Emergency Medicine

## 2018-04-12 DIAGNOSIS — R059 Cough, unspecified: Secondary | ICD-10-CM

## 2018-04-12 DIAGNOSIS — Z87891 Personal history of nicotine dependence: Secondary | ICD-10-CM | POA: Diagnosis not present

## 2018-04-12 DIAGNOSIS — I4891 Unspecified atrial fibrillation: Secondary | ICD-10-CM | POA: Diagnosis not present

## 2018-04-12 DIAGNOSIS — Z79899 Other long term (current) drug therapy: Secondary | ICD-10-CM | POA: Diagnosis not present

## 2018-04-12 DIAGNOSIS — Z20828 Contact with and (suspected) exposure to other viral communicable diseases: Secondary | ICD-10-CM | POA: Diagnosis not present

## 2018-04-12 DIAGNOSIS — I509 Heart failure, unspecified: Secondary | ICD-10-CM | POA: Diagnosis not present

## 2018-04-12 DIAGNOSIS — R0600 Dyspnea, unspecified: Secondary | ICD-10-CM

## 2018-04-12 DIAGNOSIS — I110 Hypertensive heart disease with heart failure: Secondary | ICD-10-CM | POA: Insufficient documentation

## 2018-04-12 DIAGNOSIS — R05 Cough: Secondary | ICD-10-CM | POA: Diagnosis not present

## 2018-04-12 DIAGNOSIS — I517 Cardiomegaly: Secondary | ICD-10-CM | POA: Diagnosis not present

## 2018-04-12 DIAGNOSIS — R0602 Shortness of breath: Secondary | ICD-10-CM | POA: Diagnosis not present

## 2018-04-12 NOTE — ED Triage Notes (Signed)
Patient ambulatory to triage with complaints of cough inducing SHOB since last Friday - pt seen here at that time at has been taking Azithromax and OTC Mucinex, 500mg  tylenol at 2300 prior to arrival.     Intermittent dull HA pain when coughing 4/10.  Pt denies N/V/D/chills/sweating/fever. Speaking in complete coherent sentences. No acute breathing distress noted.  Pt wife at bedside

## 2018-04-13 ENCOUNTER — Emergency Department: Payer: Medicare Other

## 2018-04-13 ENCOUNTER — Encounter: Payer: Self-pay | Admitting: Emergency Medicine

## 2018-04-13 DIAGNOSIS — I517 Cardiomegaly: Secondary | ICD-10-CM | POA: Diagnosis not present

## 2018-04-13 DIAGNOSIS — R0602 Shortness of breath: Secondary | ICD-10-CM | POA: Diagnosis not present

## 2018-04-13 LAB — CBC WITH DIFFERENTIAL/PLATELET
Abs Immature Granulocytes: 0.01 10*3/uL (ref 0.00–0.07)
Basophils Absolute: 0 10*3/uL (ref 0.0–0.1)
Basophils Relative: 0 %
Eosinophils Absolute: 0.2 10*3/uL (ref 0.0–0.5)
Eosinophils Relative: 5 %
HCT: 44.3 % (ref 39.0–52.0)
Hemoglobin: 13.9 g/dL (ref 13.0–17.0)
Immature Granulocytes: 0 %
Lymphocytes Relative: 34 %
Lymphs Abs: 1.6 10*3/uL (ref 0.7–4.0)
MCH: 28.4 pg (ref 26.0–34.0)
MCHC: 31.4 g/dL (ref 30.0–36.0)
MCV: 90.4 fL (ref 80.0–100.0)
Monocytes Absolute: 0.7 10*3/uL (ref 0.1–1.0)
Monocytes Relative: 15 %
Neutro Abs: 2.1 10*3/uL (ref 1.7–7.7)
Neutrophils Relative %: 46 %
Platelets: 152 10*3/uL (ref 150–400)
RBC: 4.9 MIL/uL (ref 4.22–5.81)
RDW: 14.6 % (ref 11.5–15.5)
WBC: 4.7 10*3/uL (ref 4.0–10.5)
nRBC: 0 % (ref 0.0–0.2)

## 2018-04-13 LAB — COMPREHENSIVE METABOLIC PANEL
ALT: 25 U/L (ref 0–44)
AST: 23 U/L (ref 15–41)
Albumin: 3.5 g/dL (ref 3.5–5.0)
Alkaline Phosphatase: 94 U/L (ref 38–126)
Anion gap: 9 (ref 5–15)
BUN: 32 mg/dL — ABNORMAL HIGH (ref 8–23)
CO2: 27 mmol/L (ref 22–32)
Calcium: 9 mg/dL (ref 8.9–10.3)
Chloride: 105 mmol/L (ref 98–111)
Creatinine, Ser: 2.04 mg/dL — ABNORMAL HIGH (ref 0.61–1.24)
GFR calc Af Amer: 34 mL/min — ABNORMAL LOW (ref >60)
GFR calc non Af Amer: 29 mL/min — ABNORMAL LOW (ref >60)
Glucose, Bld: 120 mg/dL — ABNORMAL HIGH (ref 70–99)
Potassium: 4.2 mmol/L (ref 3.5–5.1)
Sodium: 141 mmol/L (ref 135–145)
Total Bilirubin: 0.6 mg/dL (ref 0.3–1.2)
Total Protein: 7.6 g/dL (ref 6.5–8.1)

## 2018-04-13 LAB — RESPIRATORY PANEL BY PCR
Adenovirus: NOT DETECTED
Bordetella pertussis: NOT DETECTED
Chlamydophila pneumoniae: NOT DETECTED
Coronavirus 229E: NOT DETECTED
Coronavirus HKU1: NOT DETECTED
Coronavirus NL63: DETECTED — AB
Coronavirus OC43: NOT DETECTED
Influenza A: NOT DETECTED
Influenza B: NOT DETECTED
Metapneumovirus: NOT DETECTED
Mycoplasma pneumoniae: NOT DETECTED
Parainfluenza Virus 1: NOT DETECTED
Parainfluenza Virus 2: NOT DETECTED
Parainfluenza Virus 3: NOT DETECTED
Parainfluenza Virus 4: NOT DETECTED
Respiratory Syncytial Virus: NOT DETECTED
Rhinovirus / Enterovirus: NOT DETECTED

## 2018-04-13 LAB — INFLUENZA PANEL BY PCR (TYPE A & B)
Influenza A By PCR: NEGATIVE
Influenza B By PCR: NEGATIVE

## 2018-04-13 MED ORDER — ALBUTEROL SULFATE HFA 108 (90 BASE) MCG/ACT IN AERS
2.0000 | INHALATION_SPRAY | Freq: Four times a day (QID) | RESPIRATORY_TRACT | Status: DC
  Administered 2018-04-13: 2 via RESPIRATORY_TRACT
  Filled 2018-04-13: qty 6.7

## 2018-04-13 MED ORDER — ALBUTEROL SULFATE HFA 108 (90 BASE) MCG/ACT IN AERS: 2.0000 | INHALATION_SPRAY | Freq: Once | RESPIRATORY_TRACT | Status: DC

## 2018-04-13 MED ORDER — BENZONATATE 100 MG PO CAPS
200.0000 mg | ORAL_CAPSULE | Freq: Once | ORAL | Status: AC
  Administered 2018-04-13: 200 mg via ORAL
  Filled 2018-04-13: qty 2

## 2018-04-13 MED ORDER — BENZONATATE 100 MG PO CAPS: 100.0000 mg | ORAL_CAPSULE | Freq: Three times a day (TID) | ORAL | 0 refills | Status: DC | PRN

## 2018-04-13 NOTE — ED Notes (Signed)
ED Provider at bedside. 

## 2018-04-13 NOTE — ED Notes (Signed)
Pt and wife unable to drive at night due to age and unwillingness to drive. Currently noone able to come and pick them up until the morning. Okayed by charge nurse and this nurse for pt to stay in room until morning to go home.

## 2018-04-13 NOTE — ED Provider Notes (Addendum)
[facility] Emergency Department Provider Note   ____________________________________________   First MD Initiated Contact with Patient 04/12/18 2344     (approximate)  I have reviewed the triage vital signs and the nursing notes.   HISTORY  Chief Complaint Cough and Shortness of Breath  HPI [name] is a 85 y.o. male who is here on Friday for the same thing reports several days of cough.  Productive of very small amounts of phlegm he has not looked at the phlegm.  He denies any fever.  He does say he is not feeling well at all.  Cannot provide any specifics just does not feel well.  They put him on Zithromax during his last visit and he still has 1 more pill for tomorrow.         Past Medical History:  Diagnosis Date  . Atrial fibrillation ([place])   . CHF (congestive heart failure) ([place])   . Diabetes ([place])   . Hearing loss   . High blood pressure   . History of bladder problems   . Kidney stones 11/22/2014    Patient Active Problem List   Diagnosis Date Noted  . Primary osteoarthritis of left hip 02/12/2018  . Bilateral hearing loss due to cerumen impaction 10/06/2017  . Chronic systolic heart failure ([place]) 08/19/2017  . Atrial fibrillation with RVR ([place]) 08/02/2017  . Acute CHF (congestive heart failure) ([place]) 08/02/2017  . Prostatic hypertrophy 07/20/2017  . Hyperpigmentation 04/10/2017  . Uncontrolled type 2 diabetes mellitus with hyperglycemia ([place]) 03/24/2017  . Encounter for general adult medical examination with abnormal findings 03/24/2017  . Chest pain 05/04/2016  . Elevated troponin 05/04/2016  . Essential hypertension 07/06/2014  . Cough 03/21/2014  . OSA (obstructive sleep apnea) 03/21/2014  . Paroxysmal A-fib ([employer] [place]) 03/21/2014  . Hx of adenomatous colonic polyps 06/28/2013  . Liver cyst 06/28/2013  . Chronic kidney disease 06/08/2013  . Type 2 diabetes mellitus with stage 3 chronic kidney disease, without long-term  current use of insulin ([place]) 06/08/2013  . Hyperlipidemia, mixed 06/08/2013  . Valvular heart disease 06/08/2013    Past Surgical History:  Procedure Laterality Date  . CATARACT EXTRACTION    . PROSTATE CANCER    . PROSTATE SURGERY      Prior to Admission medications   Medication Sig Start Date End Date Taking? Authorizing Provider  apixaban (ELIQUIS) 5 MG TABS tablet Take 1 tablet by mouth 2 (two) times daily. 12/03/17   [provider]  atorvastatin (LIPITOR) 40 MG tablet Take 1 tablet (40 mg total) by mouth daily at 6 PM. 03/20/18   Boscia, Greer Ee, NP  azithromycin (ZITHROMAX) 500 MG tablet Take 1 tablet (500 mg total) by mouth daily for 3 days. Take 1 tablet daily for 3 days. 04/10/18 04/13/18  Merlyn Lot, MD  benzonatate (TESSALON PERLES) 100 MG capsule Take 1 capsule (100 mg total) by mouth 3 (three) times daily as needed for cough. 04/13/18 04/13/19  Nena Polio, MD  carvedilol (COREG) 25 MG tablet Take 25 mg by mouth 2 (two) times daily with a meal.    [provider]  dicyclomine (BENTYL) 10 MG capsule Take 2 capsules (20 mg total) by mouth 3 (three) times daily before meals. Patient not taking: Reported on 04/02/2018 02/24/18   Lin Landsman, MD  furosemide (LASIX) 20 MG tablet Take 1 tablet (20 mg total) by mouth daily. 12/31/17   Darylene Price A, FNP  glipiZIDE (GLUCOTROL XL) 2.5 MG 24  hr tablet Take 2.5 mg by mouth daily with breakfast.    [provider]  hydrocortisone (ANUSOL-HC) 2.5 % rectal cream Place 1 application rectally 2 (two) times daily. Patient not taking: Reported on 04/02/2018 02/24/18   Lin Landsman, MD  nitroGLYCERIN (NITROSTAT) 0.4 MG SL tablet Place 1 tablet (0.4 mg total) under the tongue every 5 (five) minutes as needed for chest pain. Patient not taking: Reported on 04/02/2018 05/05/16   Theodoro Grist, MD  [facility] VERIO test strip USE UTD TO CHECK BLOOD SUGAR ONCE DX E11.65 02/13/18   Ronnell Freshwater, NP   pantoprazole (PROTONIX) 40 MG tablet TAKE 1 TABLET(40 MG) BY MOUTH DAILY 01/26/18   Ronnell Freshwater, NP  Semaglutide,0.25 or 0.5MG /DOS, (OZEMPIC, 0.25 OR 0.5 MG/DOSE,) 2 MG/1.5ML SOPN Inject 0.5 mg into the skin once a week. 04/07/18   Ronnell Freshwater, NP    Allergies Patient has no known allergies.  Family History  Problem Relation Age of Onset  . Colon cancer Mother   . Diabetes Other   . High blood pressure Other   . Prostate cancer Brother   . Diabetes Brother     Social History Social History   Tobacco Use  . Smoking status: Former Smoker    Years: 16.00    Types: Cigarettes  . Smokeless tobacco: Never Used  Substance Use Topics  . Alcohol use: No  . Drug use: No    Review of Systems  Constitutional: No fever/chills Eyes: No visual changes. ENT: No sore throat. Cardiovascular: Denies chest pain. Respiratory: Denies shortness of breath. Gastrointestinal: No abdominal pain.  No nausea, no vomiting.  No diarrhea.  No constipation. Genitourinary: Negative for dysuria. Musculoskeletal: Negative for back pain. Skin: Negative for rash. Neurological: Negative for headaches, focal weakness  ____________________________________________   PHYSICAL EXAM:  VITAL SIGNS: ED Triage Vitals  Enc Vitals Group     BP 04/12/18 2350 (!) 144/90     Pulse Rate 04/12/18 2350 (!) 47     Resp 04/12/18 2350 20     Temp 04/12/18 2350 98 F (36.7 C)     Temp Source 04/12/18 2350 Oral     SpO2 04/12/18 2350 98 %     Weight 04/12/18 2351 255 lb 11.7 oz (116 kg)     Height 04/12/18 2351 6' (1.829 m)     Head Circumference --      Peak Flow --      Pain Score 04/12/18 2351 4     Pain Loc --      Pain Edu? --      Excl. in [place]? --     Constitutional: Alert and oriented. Well appearing and in no acute distress. Eyes: Conjunctivae are normal Head: Atraumatic. Nose: No congestion/rhinnorhea. Mouth/Throat: Mucous membranes are moist.  Oropharynx non-erythematous. Neck: No  stridor.  Cardiovascular: Irregular rate, regular rhythm. Grossly normal heart sounds.  Good peripheral circulation. Respiratory: Normal respiratory effort.  No retractions. Lungs CTAB. Gastrointestinal: Soft and nontender. No distention. No abdominal bruits.  Musculoskeletal: No lower extremity tenderness nor edema.  . Neurologic:  Normal speech and language. No gross focal neurologic deficits are appreciated. Skin:  Skin is warm, dry and intact. No rash noted. Psychiatric: Mood and affect are normal. Speech and behavior are normal.  ____________________________________________   LABS (all labs ordered are listed, but only abnormal results are displayed)  Labs Reviewed  COMPREHENSIVE METABOLIC PANEL - Abnormal; Notable for the following components:      Result Value  Glucose, Bld 120 (*)    BUN 32 (*)    Creatinine, Ser 2.04 (*)    GFR calc non Af Amer 29 (*)    GFR calc Af Amer 34 (*)    All other components within normal limits  NOVEL CORONAVIRUS, NAA (HOSPITAL ORDER, SEND-OUT TO REF LAB)  RESPIRATORY PANEL BY PCR  CBC WITH DIFFERENTIAL/PLATELET  INFLUENZA PANEL BY PCR (TYPE A & B)   ____________________________________________  EKG  EKG read and interpreted by me shows atrial fibrillation at a rate of 93 left axis nonspecific ST-T changes there is 1 PVC as well. ____________________________________________  RADIOLOGY  ED MD interpretation: CT read by radiology reviewed by me does not show any acute problems  Official radiology report(s): Ct Chest Wo Contrast  Result Date: 04/13/2018 CLINICAL DATA:  85 year old male with cough and shortness of breath. EXAM: CT CHEST WITHOUT CONTRAST TECHNIQUE: Multidetector CT imaging of the chest was performed following the standard protocol without IV contrast. COMPARISON:  Chest radiographs 04/10/2018 and earlier. Chest CTA 05/04/2016. FINDINGS: Cardiovascular: Mild to moderate cardiomegaly. No pericardial effusion. Calcified coronary  artery atherosclerosis on series 2, image 73. Minimal atherosclerosis of the visible aorta. Vascular patency is not evaluated in the absence of IV contrast. Mediastinum/Nodes: Stable since 2018 with no lymphadenopathy. Lungs/Pleura: Larger lung volumes. Major airways are patent. Mild chronic lateral segment right middle lobe and superior segment right lower lobe subpleural reticular opacity is stable. Subtle bilateral upper lobe subpleural reticular opacity also appears likely due to chronic scarring. More overt mild chronic scarring in the lingula. No acute or inflammatory appearing pulmonary opacity. No pleural effusion. Upper Abdomen: Stable liver with chronic exophytic and low-density liver lesions appearing stable since 2018, and the small exophytic lesion likely representing residual of a large right hepatic lobe cyst which was 17 centimeters diameter in 2012. Negative visible noncontrast spleen, pancreas and adrenal glands. Chronic nephrolithiasis is partially visible. Negative visible bowel. Musculoskeletal: Bulky degenerative chronic posterior element calcification in the upper thoracic spine is stable. No acute osseous abnormality identified. IMPRESSION: 1. Some chronic lung disease with no acute or inflammatory pulmonary process identified. 2. Cardiomegaly. Calcified coronary artery atherosclerosis. Nephrolithiasis. Chronic hepatic cysts. Electronically Signed   By: Genevie Ann M.D.   On: 04/13/2018 01:01    ____________________________________________   PROCEDURES  Procedure(s) performed (including Critical Care):  Procedures   ____________________________________________   INITIAL IMPRESSION / ASSESSMENT AND PLAN / ED COURSE  X-ray was good 2 days ago.  I will do a CT of his chest instead of another chest x-ray today. ----------------------------------------- 2:21 AM on 04/13/2018 -----------------------------------------  CT looks okay.  Blood work looks okay.  I will let the patient  go.  He will follow-up with his doctor.  He will continue his Zithromax.  I will give him albuterol inhaler to see if that helps.  I will give him a spacer as well.  He will take Tessalon Perles if need be.             ____________________________________________   FINAL CLINICAL IMPRESSION(S) / ED DIAGNOSES  Final diagnoses:  Cough  Dyspnea, unspecified type     ED Discharge Orders         Ordered    benzonatate (TESSALON PERLES) 100 MG capsule  3 times daily PRN     04/13/18 0220           Note:  This document was prepared using Dragon voice recognition software and may include unintentional dictation errors.  Nena Polio, MD 04/13/18 Su Hilt, MD 04/13/18 [phone]

## 2018-04-13 NOTE — Discharge Instructions (Addendum)
The CT did not show any pneumonia or any other problems.  Please try the albuterol inhaler 2 puffs 4 times a day with a spacer to see if that helps.  If the Robitussin DM or other DM cough medicine is not working you can try the [employer] 1 3 times a day as needed.  Be careful to swallow them do not chew them or suck on them as they can make you very sick if you do that.  Please return here again if you develop a fever or more shortness of breath.  Follow-up with your doctor in the next 3 or 4 days.

## 2018-04-13 NOTE — ED Notes (Signed)
Pt back from CT

## 2018-04-13 NOTE — ED Notes (Signed)
Patient transported to CT 

## 2018-04-15 ENCOUNTER — Telehealth: Payer: Self-pay

## 2018-04-15 NOTE — Telephone Encounter (Signed)
Spoke with pt  Wife advised that test for covid 19 take time to come back result some one contact them until then isolated at home and wash you hand frequently

## 2018-04-18 LAB — NOVEL CORONAVIRUS, NAA (HOSP ORDER, SEND-OUT TO REF LAB; TAT 18-24 HRS): SARS-CoV-2, NAA: NOT DETECTED

## 2018-04-20 ENCOUNTER — Other Ambulatory Visit (HOSPITAL_COMMUNITY): Payer: Self-pay

## 2018-04-20 DIAGNOSIS — G4733 Obstructive sleep apnea (adult) (pediatric): Secondary | ICD-10-CM | POA: Diagnosis not present

## 2018-04-20 NOTE — ED Notes (Signed)
Called patient with covid 19 result.  He says he needed to know as he will go back to work.  I asked him if he really had to go back to work.  He put wife on phone.  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 I also said that although he has had a negative test, it doesn't mean he will not get it,  as cases in the community are rising.  Aslo that at age 85 he is more vulnerable to the virus.

## 2018-04-20 NOTE — Progress Notes (Signed)
This was a telephone visit, had a voice mail when arrived at work today.  He was ED 2 weekends ago for a cough.  [name] is feeling much better but has a small cough still.  He has finished his antibiotics and tested negative for coronavirus.  He has all his medications.  He is returning to work this week.  His wife is wanting him to see a doctor for follow up, advised them to contact them to see if they could do a teleconference, he needs to stay out of doctors office as much as possible right now.  They appeared to understand.  Suggested some over the counter allergy medications to see if that may help, pollen is really bad right now.  Will continue to visit by telephone at this time, made them aware of if he feels sick, chest pain or anything else they may need me for I will visit if there is a need.    [place] [phone]

## 2018-04-27 DIAGNOSIS — N183 Chronic kidney disease, stage 3 (moderate): Secondary | ICD-10-CM | POA: Diagnosis not present

## 2018-04-27 DIAGNOSIS — E782 Mixed hyperlipidemia: Secondary | ICD-10-CM | POA: Diagnosis not present

## 2018-04-27 DIAGNOSIS — I10 Essential (primary) hypertension: Secondary | ICD-10-CM | POA: Diagnosis not present

## 2018-04-27 DIAGNOSIS — I5022 Chronic systolic (congestive) heart failure: Secondary | ICD-10-CM | POA: Diagnosis not present

## 2018-04-27 DIAGNOSIS — I482 Chronic atrial fibrillation, unspecified: Secondary | ICD-10-CM | POA: Diagnosis not present

## 2018-05-05 ENCOUNTER — Telehealth (HOSPITAL_COMMUNITY): Payer: Self-pay

## 2018-05-05 NOTE — Telephone Encounter (Signed)
Had a telephone visit with [name] and wife.  He states been doing well, went back to work last week.  Been a little more tired.  Cough has gotten better.  Weight is 244 lbs and sugar this am was 146.  He watches what he eats and drinks.  He stays active, swelling down in extremities.  Denies chest pain, shortness of breath, headaches or dizziness.  Been staying out of crowds and being cautious right now.  He has all his medications and aware of what to take.  Will continue to visit by phone at this time.  Did advise him and wife to contact me if have a problem and I will visit.  Did discuss with them about contacting doctors to make telephone appts than going to ER or to office visits right now.   [place] [phone]

## 2018-05-11 ENCOUNTER — Other Ambulatory Visit: Payer: Self-pay

## 2018-05-11 ENCOUNTER — Encounter: Payer: Self-pay | Admitting: Nurse Practitioner

## 2018-05-11 ENCOUNTER — Ambulatory Visit (INDEPENDENT_AMBULATORY_CARE_PROVIDER_SITE_OTHER): Payer: Medicare Other | Admitting: Nurse Practitioner

## 2018-05-11 VITALS — BP 111/68 | HR 74 | Resp 16 | Ht 72.0 in | Wt 252.8 lb

## 2018-05-11 DIAGNOSIS — I480 Paroxysmal atrial fibrillation: Secondary | ICD-10-CM

## 2018-05-11 DIAGNOSIS — I10 Essential (primary) hypertension: Secondary | ICD-10-CM | POA: Diagnosis not present

## 2018-05-11 DIAGNOSIS — E1165 Type 2 diabetes mellitus with hyperglycemia: Secondary | ICD-10-CM | POA: Diagnosis not present

## 2018-05-11 MED ORDER — SEMAGLUTIDE(0.25 OR 0.5MG/DOS) 2 MG/1.5ML ~~LOC~~ SOPN: 0.5000 mg | PEN_INJECTOR | SUBCUTANEOUS | 5 refills | Status: DC

## 2018-05-11 NOTE — Progress Notes (Signed)
[facility] [place], [place] [address]  Internal MEDICINE  Office Visit Note  Patient Name: [name]  [id number]  [id number]  Date of Service: 05/11/2018  Chief Complaint  Patient presents with  . Medication Management    ozempic  . Diabetes    His blood sugars have been elevated recently. Mostly in the mid to high 100s. Was started on Ozempic 0.25mg  weekly at last visit. Has increased this to 0.5mg  last week. Brought blood sugar log with him. blod sugars have come down to low 100s. He feels good and states tthat he has no negative side effects associated with taking this medication. He is no longe rtaking oral glipizide.  He was seen in the er after his last visit. He had developed a cough and shortness of breath. Was treated with zitrhomax for infection and given tessalon perls to use as needed for cough. He was not admitted. Was negative for COVID 19, novel coronavirus.       Current Medication: Outpatient Encounter Medications as of 05/11/2018  Medication Sig Note  . apixaban (ELIQUIS) 5 MG TABS tablet Take 1 tablet by mouth 2 (two) times daily.   Marland Kitchen atorvastatin (LIPITOR) 40 MG tablet Take 1 tablet (40 mg total) by mouth daily at 6 PM.   . benzonatate (TESSALON PERLES) 100 MG capsule Take 1 capsule (100 mg total) by mouth 3 (three) times daily as needed for cough.   . carvedilol (COREG) 25 MG tablet Take 25 mg by mouth 2 (two) times daily with a meal.   . dicyclomine (BENTYL) 10 MG capsule Take 2 capsules (20 mg total) by mouth 3 (three) times daily before meals.   . furosemide (LASIX) 20 MG tablet Take 1 tablet (20 mg total) by mouth daily.   . hydrocortisone (ANUSOL-HC) 2.5 % rectal cream Place 1 application rectally 2 (two) times daily.   . nitroGLYCERIN (NITROSTAT) 0.4 MG SL tablet Place 1 tablet (0.4 mg total) under the tongue every 5 (five) minutes as needed for chest pain.   Glory Rosebush VERIO test strip USE UTD TO CHECK BLOOD SUGAR ONCE DX  E11.65   . pantoprazole (PROTONIX) 40 MG tablet TAKE 1 TABLET(40 MG) BY MOUTH DAILY   . Semaglutide,0.25 or 0.5MG /DOS, (OZEMPIC, 0.25 OR 0.5 MG/DOSE,) 2 MG/1.5ML SOPN Inject 0.5 mg into the skin once a week.   . [DISCONTINUED] glipiZIDE (GLUCOTROL XL) 2.5 MG 24 hr tablet Take 2.5 mg by mouth daily with breakfast. 05/11/2018: patient taking ozempic 0.5mg  weekly.  . [DISCONTINUED] Semaglutide,0.25 or 0.5MG /DOS, (OZEMPIC, 0.25 OR 0.5 MG/DOSE,) 2 MG/1.5ML SOPN Inject 0.5 mg into the skin once a week.    No facility-administered encounter medications on file as of 05/11/2018.     Surgical History: Past Surgical History:  Procedure Laterality Date  . CATARACT EXTRACTION    . PROSTATE CANCER    . PROSTATE SURGERY      Medical History: Past Medical History:  Diagnosis Date  . Atrial fibrillation ([place])   . CHF (congestive heart failure) ([place])   . Diabetes ([name])   . Hearing loss   . High blood pressure   . History of bladder problems   . Kidney stones 11/22/2014    Family History: Family History  Problem Relation Age of Onset  . Colon cancer Mother   . Diabetes Other   . High blood pressure Other   . Prostate cancer Brother   . Diabetes Brother     Social History  Socioeconomic History  . Marital status: Married    Spouse name: Not on file  . Number of children: Not on file  . Years of education: Not on file  . Highest education level: Not on file  Occupational History  . 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 Financial resource strain: Not hard at all  . Food insecurity:    Worry: Never true    Inability: Never true  . Transportation needs:    Medical: No    Non-medical: No  Tobacco Use  . Smoking status: Former Smoker    Years: 16.00    Types: Cigarettes  . Smokeless tobacco: Never Used  Substance and Sexual Activity  . Alcohol use: No  . Drug use: No  . Sexual activity: Not Currently  Lifestyle  . Physical activity:    Days per week: 0 days    Minutes per  session: 0 min  . Stress: Not at all  Relationships  . Social connections:    Talks on phone: Twice a week    Gets together: Never    Attends religious service: 1 to 4 times per year    Active member of club or organization: No    Attends meetings of clubs or organizations: Never    Relationship status: Married  . Intimate partner violence:    Fear of current or ex partner: No    Emotionally abused: No    Physically abused: No    Forced sexual activity: No  Other Topics Concern  . Not on file  Social History Narrative  . Not on file      Review of Systems  Constitutional: Negative for activity change, chills, fatigue and unexpected weight change.  HENT: Negative for congestion, postnasal drip, rhinorrhea, sneezing, sore throat and voice change.   Respiratory: Negative for cough, chest tightness, shortness of breath and wheezing.   Cardiovascular: Negative for chest pain and palpitations.       Chronic a fib.  Gastrointestinal: Negative for abdominal pain, constipation, diarrhea, nausea and vomiting.  Endocrine: Negative for cold intolerance, heat intolerance, polydipsia and polyuria.       Impoved blood sugars since starting on ozempic. Now up to 0.5mg  weekly.   Genitourinary: Negative for dysuria and frequency.  Musculoskeletal: Negative for arthralgias, back pain, joint swelling and neck pain.  Skin: Negative for rash.  Allergic/Immunologic: Negative for environmental allergies.  Neurological: Negative for dizziness, tremors, numbness and headaches.  Hematological: Negative for adenopathy. Does not bruise/bleed easily.  Psychiatric/Behavioral: Negative for behavioral problems (Depression), sleep disturbance and suicidal ideas. The patient is not nervous/anxious.     Today's Vitals   05/11/18 0913  BP: 111/68  Pulse: 74  Resp: 16  SpO2: 94%  Weight: 252 lb 12.8 oz (114.7 kg)  Height: 6' (1.829 m)   Body mass index is 34.29 kg/m.  Physical Exam Vitals signs and  nursing note reviewed.  Constitutional:      General: He is not in acute distress.    Appearance: Normal appearance. He is well-developed. He is not diaphoretic.  HENT:     Head: Normocephalic and atraumatic.     Nose: Nose normal.     Mouth/Throat:     Pharynx: No oropharyngeal exudate.  Eyes:     Conjunctiva/sclera: Conjunctivae normal.     Pupils: Pupils are equal, round, and reactive to light.  Neck:     Musculoskeletal: Normal range of motion and neck supple.     Thyroid: No thyromegaly.  Vascular: No carotid bruit or JVD.     Trachea: No tracheal deviation.  Cardiovascular:     Rate and Rhythm: Normal rate. Rhythm irregular.     Pulses:          Dorsalis pedis pulses are 1+ on the right side and 1+ on the left side.       Posterior tibial pulses are 1+ on the right side and 1+ on the left side.     Heart sounds: Murmur present. No friction rub. No gallop.      Comments: Irregular heart rhythm with soft, blowing murmur. Pulmonary:     Effort: Pulmonary effort is normal. No respiratory distress.     Breath sounds: Normal breath sounds. No wheezing or rales.  Chest:     Chest wall: No tenderness.  Abdominal:     General: Bowel sounds are normal.     Palpations: Abdomen is soft.     Tenderness: There is no abdominal tenderness.  Musculoskeletal: Normal range of motion.     Right foot: Normal range of motion. No deformity.     Left foot: Normal range of motion. No deformity.  Feet:     Right foot:     Protective Sensation: 7 sites tested. 7 sites sensed.     Skin integrity: Dry skin present. No skin breakdown.     Left foot:     Protective Sensation: 7 sites tested. 7 sites sensed.     Skin integrity: Dry skin present. No skin breakdown.  Lymphadenopathy:     Cervical: No cervical adenopathy.  Skin:    General: Skin is warm and dry.     Capillary Refill: Capillary refill takes less than 2 seconds.  Neurological:     Mental Status: He is alert and oriented to  person, place, and time. Mental status is at baseline.     Cranial Nerves: No cranial nerve deficit.     Sensory: No sensory deficit.  Psychiatric:        Mood and Affect: Mood normal.        Behavior: Behavior normal.        Thought Content: Thought content normal.        Judgment: Judgment normal.    Assessment/Plan: 1. Uncontrolled type 2 diabetes mellitus with hyperglycemia (HCC) Blood sugars now running in the low 100s. No longer taking glipizide. Will continue with ozempic 0.5mg  weekly. New prescription sent to his pharmacy today.  - Semaglutide,0.25 or 0.5MG /DOS, (OZEMPIC, 0.25 OR 0.5 MG/DOSE,) 2 MG/1.5ML SOPN; Inject 0.5 mg into the skin once a week.  Dispense: 1 pen; Refill: 5  2. Essential hypertension Stable. Continue BP medication as prescribed   3. Paroxysmal A-fib (HCC) Overall, stable. Continue regular visits with cardiology as scheduled.   General Counseling: kostas marrow understanding of the findings of todays visit and agrees with plan of treatment. I have discussed any further diagnostic evaluation that may be needed or ordered today. We also reviewed his medications today. he has been encouraged to call the office with any questions or concerns that should arise related to todays visit.  Diabetes Counseling:  1. Addition of ACE inh/ ARB'S for nephroprotection. Microalbumin is updated  2. Diabetic foot care, prevention of complications. Podiatry consult 3. Exercise and lose weight.  4. Diabetic eye examination, Diabetic eye exam is updated  5. Monitor blood sugar closlely. nutrition counseling.  6. Sign and symptoms of hypoglycemia including shaking sweating,confusion and headaches.  This patient was seen by Leretha Pol  FNP Collaboration with Dr Lavera Guise as a part of collaborative care agreement  Meds ordered this encounter  Medications  . Semaglutide,0.25 or 0.5MG /DOS, (OZEMPIC, 0.25 OR 0.5 MG/DOSE,) 2 MG/1.5ML SOPN    Sig: Inject 0.5 mg into the  skin once a week.    Dispense:  1 pen    Refill:  5    Please d/c glipizide. Started with sample last visit and blood sugars doing well on ozempic alone.    Order Specific Question:   Supervising Provider    Answer:   Lavera Guise [[id number]]    Time spent: 51 Minutes      Dr Lavera Guise Internal medicine

## 2018-05-21 DIAGNOSIS — G4733 Obstructive sleep apnea (adult) (pediatric): Secondary | ICD-10-CM | POA: Diagnosis not present

## 2018-06-01 ENCOUNTER — Telehealth (HOSPITAL_COMMUNITY): Payer: Self-pay

## 2018-06-01 NOTE — Telephone Encounter (Signed)
BGL: 125  Telephone visit with [name], he states been doing good.  Sugar has been doing better.  His weight been staying same.  He has been watching salty foods and fluids.  Has all his medications, verified his meds.  He is working.  Sleeping well.  Denies chest pain, headaches, shortness of breath or dizziness.   Swelling is down in extremities.  Will continue to visit by telephone at this time.  He is aware that I will do a home visit if there is a need or problem.  Will continue to visit for heart failure, diet and medication compliance.    [place] [phone]

## 2018-06-05 ENCOUNTER — Other Ambulatory Visit: Payer: Self-pay

## 2018-06-05 MED ORDER — ATORVASTATIN CALCIUM 40 MG PO TABS: 40.0000 mg | ORAL_TABLET | Freq: Every day | ORAL | 5 refills | Status: DC

## 2018-06-17 ENCOUNTER — Ambulatory Visit (INDEPENDENT_AMBULATORY_CARE_PROVIDER_SITE_OTHER): Payer: Medicare Other | Admitting: Vascular Surgery

## 2018-06-17 ENCOUNTER — Other Ambulatory Visit (INDEPENDENT_AMBULATORY_CARE_PROVIDER_SITE_OTHER): Payer: Medicare Other

## 2018-06-17 ENCOUNTER — Encounter (INDEPENDENT_AMBULATORY_CARE_PROVIDER_SITE_OTHER): Payer: Medicare Other

## 2018-06-20 DIAGNOSIS — G4733 Obstructive sleep apnea (adult) (pediatric): Secondary | ICD-10-CM | POA: Diagnosis not present

## 2018-06-23 ENCOUNTER — Telehealth (HOSPITAL_COMMUNITY): Payer: Self-pay

## 2018-06-23 NOTE — Telephone Encounter (Signed)
Today was a visit with [name] by telephone.  His wife did most of talking, states he had a nose bleed last week that was bad and his doctor stopped his Eliquis for a few days.  Advised them to try [place] to keep membranes in nose moist at night while he uses oxygen, he has tried using humidified oxygen but he states makes him cough.  He has been using just oxygen at night.  He denies chest pain, shortness of breath, headaches or dizziness.  Swelling goes down over night.  Watches high sodium foods and watched his fluids.  He does not add salt to foods.  His wife states [facility] has sent new scales and not sure how to set them up, will go to his home and help set up.  He is aware of appts coming up.  He has all his medications, meds were verified.  Weight staying within 3 lbs.  Will continue to visit for heart failure.  [place] [phone]

## 2018-06-25 ENCOUNTER — Other Ambulatory Visit (HOSPITAL_COMMUNITY): Payer: Self-pay

## 2018-06-25 NOTE — Progress Notes (Signed)
Went to home and help set up scales and bp machine from [employer] today.   [place] [phone]

## 2018-07-06 ENCOUNTER — Encounter: Payer: Self-pay | Admitting: Nurse Practitioner

## 2018-07-21 DIAGNOSIS — G4733 Obstructive sleep apnea (adult) (pediatric): Secondary | ICD-10-CM | POA: Diagnosis not present

## 2018-07-23 ENCOUNTER — Other Ambulatory Visit (INDEPENDENT_AMBULATORY_CARE_PROVIDER_SITE_OTHER): Payer: Self-pay | Admitting: Vascular Surgery

## 2018-07-23 ENCOUNTER — Other Ambulatory Visit: Payer: Self-pay

## 2018-07-23 ENCOUNTER — Ambulatory Visit (INDEPENDENT_AMBULATORY_CARE_PROVIDER_SITE_OTHER): Payer: Medicare Other | Admitting: Nurse Practitioner

## 2018-07-23 ENCOUNTER — Encounter: Payer: Self-pay | Admitting: Nurse Practitioner

## 2018-07-23 VITALS — BP 134/82 | HR 86 | Resp 16 | Ht 72.0 in | Wt 252.0 lb

## 2018-07-23 DIAGNOSIS — I4891 Unspecified atrial fibrillation: Secondary | ICD-10-CM

## 2018-07-23 DIAGNOSIS — I714 Abdominal aortic aneurysm, without rupture, unspecified: Secondary | ICD-10-CM

## 2018-07-23 DIAGNOSIS — Z0001 Encounter for general adult medical examination with abnormal findings: Secondary | ICD-10-CM

## 2018-07-23 DIAGNOSIS — E1165 Type 2 diabetes mellitus with hyperglycemia: Secondary | ICD-10-CM

## 2018-07-23 DIAGNOSIS — K551 Chronic vascular disorders of intestine: Secondary | ICD-10-CM

## 2018-07-23 DIAGNOSIS — R300 Dysuria: Secondary | ICD-10-CM | POA: Diagnosis not present

## 2018-07-23 DIAGNOSIS — I5022 Chronic systolic (congestive) heart failure: Secondary | ICD-10-CM

## 2018-07-23 LAB — POCT GLYCOSYLATED HEMOGLOBIN (HGB A1C): Hemoglobin A1C: 7.5 % — AB (ref 4.0–5.6)

## 2018-07-23 NOTE — Progress Notes (Signed)
[facility] [place], [place] [address]  Internal MEDICINE  Office Visit Note  Patient Name: [name]  [id number]  [id number]  Date of Service: 07/23/2018   Pt is here for routine health maintenance examination  Chief Complaint  Patient presents with  . Medicare Wellness  . Hypertension  . Diabetes    A1C     The patient is here for health maintenance exam. Was started on ozempic at his last visit. Blood sugar logs show that sugars started in the mid 100s. The last two days, sugars have been in the high 90s. His HgbA1c is 7.5 today, down from 8.4 at the last check. He states that he has tolerated the change in medication well. Admits that he has a bowel movement nearly every time he eats. Denies abdominal pain or cramping. Does not have loose stools. Denies blood in the stool. This has been normal for him for some time.    Current Medication: Outpatient Encounter Medications as of 07/23/2018  Medication Sig  . apixaban (ELIQUIS) 5 MG TABS tablet Take 1 tablet by mouth 2 (two) times daily.  Marland Kitchen atorvastatin (LIPITOR) 40 MG tablet Take 1 tablet (40 mg total) by mouth daily at 6 PM.  . carvedilol (COREG) 25 MG tablet Take 25 mg by mouth 2 (two) times daily with a meal.  . furosemide (LASIX) 20 MG tablet Take 1 tablet (20 mg total) by mouth daily.  Glory Rosebush VERIO test strip USE UTD TO CHECK BLOOD SUGAR ONCE DX E11.65  . pantoprazole (PROTONIX) 40 MG tablet TAKE 1 TABLET(40 MG) BY MOUTH DAILY  . Semaglutide,0.25 or 0.5MG /DOS, (OZEMPIC, 0.25 OR 0.5 MG/DOSE,) 2 MG/1.5ML SOPN Inject 0.5 mg into the skin once a week.  . dicyclomine (BENTYL) 10 MG capsule Take 2 capsules (20 mg total) by mouth 3 (three) times daily before meals.  . [DISCONTINUED] benzonatate (TESSALON PERLES) 100 MG capsule Take 1 capsule (100 mg total) by mouth 3 (three) times daily as needed for cough. (Patient not taking: Reported on 06/23/2018)  . [DISCONTINUED] hydrocortisone (ANUSOL-HC)  2.5 % rectal cream Place 1 application rectally 2 (two) times daily. (Patient not taking: Reported on 07/23/2018)  . [DISCONTINUED] nitroGLYCERIN (NITROSTAT) 0.4 MG SL tablet Place 1 tablet (0.4 mg total) under the tongue every 5 (five) minutes as needed for chest pain. (Patient not taking: Reported on 07/23/2018)   No facility-administered encounter medications on file as of 07/23/2018.     Surgical History: Past Surgical History:  Procedure Laterality Date  . CATARACT EXTRACTION    . PROSTATE CANCER    . PROSTATE SURGERY      Medical History: Past Medical History:  Diagnosis Date  . Atrial fibrillation ([place])   . CHF (congestive heart failure) ([place])   . Diabetes ([place])   . Hearing loss   . High blood pressure   . History of bladder problems   . Kidney stones 11/22/2014    Family History: Family History  Problem Relation Age of Onset  . Colon cancer Mother   . Diabetes Other   . High blood pressure Other   . Prostate cancer Brother   . Diabetes Brother       Review of Systems  Constitutional: Negative for activity change, chills, fatigue and unexpected weight change.  HENT: Negative for congestion, postnasal drip, rhinorrhea, sneezing, sore throat and voice change.   Respiratory: Negative for cough, chest tightness, shortness of breath and wheezing.   Cardiovascular: Negative for chest pain  and palpitations.       Chronic a fib.  Gastrointestinal: Negative for abdominal pain, constipation, diarrhea, nausea and vomiting.       Frequent bowel movements, mostly after he eats.   Endocrine: Negative for cold intolerance, heat intolerance, polydipsia and polyuria.       Impoved blood sugars since starting on ozempic.   Genitourinary: Negative for dysuria and frequency.  Musculoskeletal: Negative for arthralgias, back pain, joint swelling and neck pain.  Skin: Negative for rash.       Darkened areas of the skin of bilateral lower legs.   Allergic/Immunologic: Negative for  environmental allergies.  Neurological: Negative for dizziness, tremors, numbness and headaches.  Hematological: Negative for adenopathy. Does not bruise/bleed easily.  Psychiatric/Behavioral: Negative for behavioral problems (Depression), sleep disturbance and suicidal ideas. The patient is not nervous/anxious.     Today's Vitals   07/23/18 0833  BP: 134/82  Pulse: 86  Resp: 16  SpO2: 92%  Weight: 252 lb (114.3 kg)  Height: 6' (1.829 m)   Body mass index is 34.18 kg/m.  Physical Exam Vitals signs and nursing note reviewed.  Constitutional:      General: He is not in acute distress.    Appearance: Normal appearance. He is well-developed. He is not diaphoretic.  HENT:     Head: Normocephalic and atraumatic.     Nose: Nose normal.     Mouth/Throat:     Pharynx: No oropharyngeal exudate.  Eyes:     Conjunctiva/sclera: Conjunctivae normal.     Pupils: Pupils are equal, round, and reactive to light.  Neck:     Musculoskeletal: Normal range of motion and neck supple.     Thyroid: No thyromegaly.     Vascular: No carotid bruit or JVD.     Trachea: No tracheal deviation.  Cardiovascular:     Rate and Rhythm: Normal rate. Rhythm irregular.     Pulses:          Dorsalis pedis pulses are 1+ on the right side and 1+ on the left side.       Posterior tibial pulses are 1+ on the right side and 1+ on the left side.     Heart sounds: Murmur present. No friction rub. No gallop.      Comments: Irregular heart rhythm with soft, blowing murmur. Pulmonary:     Effort: Pulmonary effort is normal. No respiratory distress.     Breath sounds: Normal breath sounds. No wheezing or rales.  Chest:     Chest wall: No tenderness.  Abdominal:     General: Bowel sounds are normal.     Palpations: Abdomen is soft.     Tenderness: There is no abdominal tenderness.  Musculoskeletal: Normal range of motion.     Right foot: Normal range of motion. No deformity.     Left foot: Normal range of motion.  No deformity.  Feet:     Right foot:     Protective Sensation: 10 sites tested. 10 sites sensed.     Skin integrity: Dry skin present. No skin breakdown or erythema.     Toenail Condition: Right toenails are normal.     Left foot:     Protective Sensation: 10 sites tested. 10 sites sensed.     Skin integrity: Dry skin present. No skin breakdown or erythema.     Toenail Condition: Left toenails are normal.  Lymphadenopathy:     Cervical: No cervical adenopathy.  Skin:    General: Skin is  warm and dry.     Capillary Refill: Capillary refill takes less than 2 seconds.  Neurological:     Mental Status: He is alert and oriented to person, place, and time. Mental status is at baseline.     Cranial Nerves: No cranial nerve deficit.     Sensory: No sensory deficit.  Psychiatric:        Mood and Affect: Mood normal.        Behavior: Behavior normal.        Thought Content: Thought content normal.        Judgment: Judgment normal.    Depression screen [facility] 2/9 07/23/2018 02/06/2018 01/08/2018 09/29/2017 08/19/2017  Decreased Interest 0 0 0 0 0  Down, Depressed, Hopeless 0 0 0 0 1  PHQ - 2 Score 0 0 0 0 1    Functional Status Survey: Is the patient deaf or have difficulty hearing?: Yes Does the patient have difficulty seeing, even when wearing glasses/contacts?: No Does the patient have difficulty concentrating, remembering, or making decisions?: No Does the patient have difficulty walking or climbing stairs?: No Does the patient have difficulty dressing or bathing?: No Does the patient have difficulty doing errands alone such as visiting a doctor's office or shopping?: No  MMSE - [place] Exam 07/23/2018 07/01/2017  Orientation to time 5 5  Orientation to Place 5 5  Registration 3 3  Attention/ Calculation 5 5  Recall 3 3  Language- name 2 objects 2 2  Language- repeat 1 1  Language- follow 3 step command 3 3  Language- read & follow direction 1 1  Write a sentence 1 0  Copy  design 1 0  Total score 30 28    Fall Risk  07/23/2018 03/12/2018 02/06/2018 01/08/2018 11/24/2017  Falls in the past year? 0 0 0 0 0  Follow up - Falls evaluation completed - - -      LABS: Recent Results (from the past 2160 hour(s))  POCT HgB A1C     Status: Abnormal   Collection Time: 07/23/18  8:46 AM  Result Value Ref Range   Hemoglobin A1C 7.5 (A) 4.0 - 5.6 %   HbA1c POC (<> result, manual entry)     HbA1c, POC (prediabetic range)     HbA1c, POC (controlled diabetic range)     Assessment/Plan:  1. Encounter for general adult medical examination with abnormal findings Annual health maintenance exam today.   2. Uncontrolled type 2 diabetes mellitus with hyperglycemia (HCC) - POCT HgB A1C 7.5 today down from 8.4 at his last check. Continue ozempic 0.5mg  weekly. Reviewed diabetic diet and importance of regular exercise to lower blood sugars.   3. Atrial fibrillation with RVR (HCC) Stable. Continue regular visits with cardiology as scheduled.    4. Chronic systolic heart failure (HCC) Stable. Continue regular visits with cardiology as scheduled.    5. Dysuria - UA/M w/rflx Culture, Routine  General Counseling: [place] verbalizes understanding of the findings of todays visit and agrees with plan of treatment. I have discussed any further diagnostic evaluation that may be needed or ordered today. We also reviewed his medications today. he has been encouraged to call the office with any questions or concerns that should arise related to todays visit.    Counseling:  Diabetes Counseling:  1. Addition of ACE inh/ ARB'S for nephroprotection. Microalbumin is updated  2. Diabetic foot care, prevention of complications. Podiatry consult 3. Exercise and lose weight.  4. Diabetic eye examination, Diabetic eye  exam is updated  5. Monitor blood sugar closlely. nutrition counseling.  6. Sign and symptoms of hypoglycemia including shaking sweating,confusion and headaches.  This patient  was seen by Leretha Pol FNP Collaboration with Dr Lavera Guise as a part of collaborative care agreement  Orders Placed This Encounter  Procedures  . UA/M w/rflx Culture, Routine  . POCT HgB A1C     Time spent: [place], MD  Internal Medicine

## 2018-07-24 LAB — UA/M W/RFLX CULTURE, ROUTINE
Bilirubin, UA: NEGATIVE
Glucose, UA: NEGATIVE
Ketones, UA: NEGATIVE
Leukocytes,UA: NEGATIVE
Nitrite, UA: NEGATIVE
Protein,UA: NEGATIVE
RBC, UA: NEGATIVE
Specific Gravity, UA: 1.01 (ref 1.005–1.030)
Urobilinogen, Ur: 0.2 mg/dL (ref 0.2–1.0)
pH, UA: 5 (ref 5.0–7.5)

## 2018-07-24 LAB — MICROSCOPIC EXAMINATION
Bacteria, UA: NONE SEEN
Casts: NONE SEEN /lpf

## 2018-07-28 ENCOUNTER — Ambulatory Visit (INDEPENDENT_AMBULATORY_CARE_PROVIDER_SITE_OTHER): Payer: Medicare Other

## 2018-07-28 ENCOUNTER — Encounter (INDEPENDENT_AMBULATORY_CARE_PROVIDER_SITE_OTHER): Payer: Medicare Other

## 2018-07-28 ENCOUNTER — Other Ambulatory Visit: Payer: Self-pay

## 2018-07-28 ENCOUNTER — Ambulatory Visit (INDEPENDENT_AMBULATORY_CARE_PROVIDER_SITE_OTHER): Payer: Medicare Other | Admitting: Nurse Practitioner

## 2018-07-28 ENCOUNTER — Encounter (INDEPENDENT_AMBULATORY_CARE_PROVIDER_SITE_OTHER): Payer: Self-pay | Admitting: Nurse Practitioner

## 2018-07-28 VITALS — BP 145/104 | HR 126 | Resp 12 | Ht 72.0 in | Wt 251.0 lb

## 2018-07-28 DIAGNOSIS — I714 Abdominal aortic aneurysm, without rupture, unspecified: Secondary | ICD-10-CM

## 2018-07-28 DIAGNOSIS — Z79899 Other long term (current) drug therapy: Secondary | ICD-10-CM

## 2018-07-28 DIAGNOSIS — K551 Chronic vascular disorders of intestine: Secondary | ICD-10-CM

## 2018-07-28 DIAGNOSIS — I10 Essential (primary) hypertension: Secondary | ICD-10-CM | POA: Diagnosis not present

## 2018-07-28 DIAGNOSIS — Z87891 Personal history of nicotine dependence: Secondary | ICD-10-CM | POA: Diagnosis not present

## 2018-07-28 DIAGNOSIS — I723 Aneurysm of iliac artery: Secondary | ICD-10-CM | POA: Diagnosis not present

## 2018-07-28 NOTE — Progress Notes (Signed)
SUBJECTIVE:  Patient ID: [name], male    DOB: 1933/12/08, 85 y.o.   MRN: [id number] Chief Complaint  Patient presents with  . Follow-up    HPI  [name] is a 85 y.o. male The patient returns to the office for surveillance of a known abdominal aortic aneurysm. Patient denies abdominal pain or back pain, no other abdominal complaints. No changes suggesting embolic episodes.   There have been no interval changes in the patient's overall health care since his last visit.  Patient denies amaurosis fugax or TIA symptoms. There is no history of claudication or rest pain symptoms of the lower extremities. The patient denies angina or shortness of breath.   Duplex US of the aorta and iliac arteries shows an AAA measured 3.1 cm with 2 cm right iliac artery aneurysms. Previous study showed 3.0 cm on 12/16/2017.  Ultrasonographer reported that celiac arteries had decreased velocities from previous exam.  Past Medical History:  Diagnosis Date  . Atrial fibrillation ([place])   . CHF (congestive heart failure) ([place])   . Diabetes ([place])   . Hearing loss   . High blood pressure   . History of bladder problems   . Kidney stones 11/22/2014    Past Surgical History:  Procedure Laterality Date  . CATARACT EXTRACTION    . PROSTATE CANCER    . PROSTATE SURGERY      Social History   Socioeconomic History  . Marital status: Married    Spouse name: Not on file  . Number of children: Not on file  . Years of education: Not on file  . Highest education level: Not on file  Occupational History  . 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 Financial resource strain: Not hard at all  . Food insecurity    Worry: Never true    Inability: Never true  . Transportation needs    Medical: No    Non-medical: No  Tobacco Use  . Smoking status: Former Smoker    Years: 16.00    Types: Cigarettes  . Smokeless tobacco: Never Used  Substance and Sexual Activity  . Alcohol use: No  . Drug  use: No  . Sexual activity: Not Currently  Lifestyle  . Physical activity    Days per week: 0 days    Minutes per session: 0 min  . Stress: Not at all  Relationships  . Social Herbalist on phone: Twice a week    Gets together: Never    Attends religious service: 1 to 4 times per year    Active member of club or organization: No    Attends meetings of clubs or organizations: Never    Relationship status: Married  . Intimate partner violence    Fear of current or ex partner: No    Emotionally abused: No    Physically abused: No    Forced sexual activity: No  Other Topics Concern  . Not on file  Social History Narrative  . Not on file    Family History  Problem Relation Age of Onset  . Colon cancer Mother   . Diabetes Other   . High blood pressure Other   . Prostate cancer Brother   . Diabetes Brother     No Known Allergies   Review of Systems   Review of Systems: Negative Unless Checked Constitutional: [] Weight loss  [] Fever  [] Chills Cardiac: [] Chest pain   [x]  Atrial Fibrillation  [] Palpitations   [] Shortness of  breath when laying flat   [] Shortness of breath with exertion. [] Shortness of breath at rest Vascular:  [] Pain in legs with walking   [] Pain in legs with standing [] Pain in legs when laying flat   [] Claudication    [] Pain in feet when laying flat    [] History of DVT   [] Phlebitis   [] Swelling in legs   [] Varicose veins   [] Non-healing ulcers Pulmonary:   [] Uses home oxygen   [] Productive cough   [] Hemoptysis   [] Wheeze  [] COPD   [] Asthma Neurologic:  [] Dizziness   [] Seizures  [] Blackouts [] History of stroke   [] History of TIA  [] Aphasia   [] Temporary Blindness   [] Weakness or numbness in arm   [] Weakness or numbness in leg Musculoskeletal:   [] Joint swelling   [] Joint pain   [] Low back pain  []  History of Knee Replacement [] Arthritis [] back Surgeries  []  Spinal Stenosis    Hematologic:  [] Easy bruising  [] Easy bleeding   [] Hypercoagulable state    [x] Anemic Gastrointestinal:  [] Diarrhea   [] Vomiting  [] Gastroesophageal reflux/heartburn   [] Difficulty swallowing. [] Abdominal pain Genitourinary:  [x] Chronic kidney disease   [] Difficult urination  [] Anuric   [] Blood in urine [] Frequent urination  [] Burning with urination   [] Hematuria Skin:  [] Rashes   [] Ulcers [] Wounds Psychological:  [] History of anxiety   []  History of major depression  []  Memory Difficulties      OBJECTIVE:   Physical Exam  BP (!) 145/104 (BP Location: Left Arm, Patient Position: Sitting, Cuff Size: Normal)   Pulse (!) 126   Resp 12   Ht 6' (1.829 m)   Wt 251 lb (113.9 kg)   BMI 34.04 kg/m   Gen: WD/WN, NAD Head: [place]/AT, No temporalis wasting.  Ear/Nose/Throat: Hearing grossly intact, nares w/o erythema or drainage Eyes: PER, EOMI, sclera nonicteric.  Neck: Supple, no masses.  No JVD.  Pulmonary:  Good air movement, no use of accessory muscles.  Cardiac: RRR Vascular: 1+ edema bilaterally Vessel Right Left  Radial Palpable Palpable  Popliteal Not Palpable Not Palpable  Dorsalis Pedis Trace Palpable Trace Palpable  Posterior Tibial Trace Palpable Trace Palpable   Gastrointestinal: soft, non-distended. No guarding/no peritoneal signs.  Musculoskeletal: M/S 5/5 throughout.  No deformity or atrophy.  Neurologic: Pain and light touch intact in extremities.  Symmetrical.  Speech is fluent. Motor exam as listed above. Psychiatric: Judgment intact, Mood & affect appropriate for pt's clinical situation. Dermatologic: No Venous rashes. No Ulcers Noted.  No changes consistent with cellulitis. Lymph : No Cervical lymphadenopathy, no lichenification or skin changes of chronic lymphedema.       ASSESSMENT AND PLAN:  1. Chronic mesenteric ischemia (HCC) Recommend:  The patient has evidence of chronic asymptomatic mesenteric atherosclerosis.  The patient denies lifestyle limiting changes at this point in time.  Given the lack of symptoms no intervention is  warranted at this time.   No further invasive studies, angiography or surgery at this time  The patient should continue walking and begin a more formal exercise program.   The patient should continue antiplatelet therapy and aggressive treatment of the lipid abnormalities  Patient should undergo noninvasive studies as ordered. The patient will follow up with me after the studies.   - VAS [place] MESENTERIC; Future  2. AAA (abdominal aortic aneurysm) without rupture (HCC) No surgery or intervention at this time. The patient has an asymptomatic abdominal aortic aneurysm that is less than 4 cm in maximal diameter.  I have discussed the natural history of abdominal aortic  aneurysm and the small risk of rupture for aneurysm less than 5 cm in size.  However, as these small aneurysms tend to enlarge over time, continued surveillance with ultrasound or CT scan is mandatory.  I have also discussed optimizing medical management with hypertension and lipid control and the importance of abstinence from tobacco.  The patient is also encouraged to exercise a minimum of 30 minutes 4 times a week.  Should the patient develop new onset abdominal or back pain or signs of peripheral embolization they are instructed to seek medical attention immediately and to alert the physician providing care that they have an aneurysm.  The patient voices their understanding. The patient will return in 12 months with an aortic duplex.  - VAS [place] AAA DUPLEX; Future  3. Essential hypertension Continue antihypertensive medications as already ordered, these medications have been reviewed and there are no changes at this time.    Current Outpatient Medications on File Prior to Visit  Medication Sig Dispense Refill  . apixaban (ELIQUIS) 5 MG TABS tablet Take 1 tablet by mouth 2 (two) times daily.    Marland Kitchen atorvastatin (LIPITOR) 40 MG tablet Take 1 tablet (40 mg total) by mouth daily at 6 PM. 30 tablet 5  . carvedilol (COREG) 25 MG  tablet Take 25 mg by mouth 2 (two) times daily with a meal.    . furosemide (LASIX) 20 MG tablet Take 1 tablet (20 mg total) by mouth daily. 90 tablet 3  . pantoprazole (PROTONIX) 40 MG tablet TAKE 1 TABLET(40 MG) BY MOUTH DAILY 30 tablet 6  . Semaglutide,0.25 or 0.5MG /DOS, (OZEMPIC, 0.25 OR 0.5 MG/DOSE,) 2 MG/1.5ML SOPN Inject 0.5 mg into the skin once a week. 1 pen 5  . dicyclomine (BENTYL) 10 MG capsule Take 2 capsules (20 mg total) by mouth 3 (three) times daily before meals. 30 capsule 0  . ONETOUCH VERIO test strip USE UTD TO CHECK BLOOD SUGAR ONCE DX E11.65 100 each 11   No current facility-administered medications on file prior to visit.     There are no Patient Instructions on file for this visit. No follow-ups on file.   Kris Hartmann, NP  This note was completed with Sales executive.  Any errors are purely unintentional.

## 2018-07-29 ENCOUNTER — Telehealth (HOSPITAL_COMMUNITY): Payer: Self-pay

## 2018-07-29 NOTE — Telephone Encounter (Signed)
Weight: 245 lbs Had a telephone visit with [name] today.  He states been doing ok, he is still working.  He says he has some swelling this evening when he got off work, he states been going down at night.  He denies chest pain, shortness of breath, headaches or dizziness.  Has all his medications and has been taking them, verified his meds.  Still watching what he eats and how much fluids.  He stays active with working and on the weekends.  Sugars has been under control.  He is aware of up coming appts.  Will continue to visit for heart failure.   [place] [phone]

## 2018-08-11 ENCOUNTER — Encounter (HOSPITAL_COMMUNITY): Payer: Self-pay

## 2018-08-11 ENCOUNTER — Encounter: Payer: Self-pay | Admitting: Emergency Medicine

## 2018-08-11 ENCOUNTER — Other Ambulatory Visit: Payer: Self-pay

## 2018-08-11 ENCOUNTER — Emergency Department: Payer: Medicare Other

## 2018-08-11 ENCOUNTER — Emergency Department
Admission: EM | Admit: 2018-08-11 | Discharge: 2018-08-11 | Disposition: A | Discharge status: Home / Self Care | Payer: Medicare Other | Attending: Student in an Organized Health Care Education/Training Program | Admitting: Student in an Organized Health Care Education/Training Program

## 2018-08-11 ENCOUNTER — Other Ambulatory Visit (HOSPITAL_COMMUNITY): Payer: Self-pay

## 2018-08-11 DIAGNOSIS — I4891 Unspecified atrial fibrillation: Secondary | ICD-10-CM | POA: Diagnosis not present

## 2018-08-11 DIAGNOSIS — Z87891 Personal history of nicotine dependence: Secondary | ICD-10-CM | POA: Insufficient documentation

## 2018-08-11 DIAGNOSIS — I480 Paroxysmal atrial fibrillation: Secondary | ICD-10-CM | POA: Insufficient documentation

## 2018-08-11 DIAGNOSIS — R001 Bradycardia, unspecified: Secondary | ICD-10-CM | POA: Diagnosis not present

## 2018-08-11 DIAGNOSIS — R4020 Unspecified coma: Secondary | ICD-10-CM | POA: Diagnosis not present

## 2018-08-11 DIAGNOSIS — Z20828 Contact with and (suspected) exposure to other viral communicable diseases: Secondary | ICD-10-CM | POA: Insufficient documentation

## 2018-08-11 DIAGNOSIS — Z79899 Other long term (current) drug therapy: Secondary | ICD-10-CM | POA: Insufficient documentation

## 2018-08-11 DIAGNOSIS — R0602 Shortness of breath: Secondary | ICD-10-CM | POA: Diagnosis not present

## 2018-08-11 DIAGNOSIS — E1122 Type 2 diabetes mellitus with diabetic chronic kidney disease: Secondary | ICD-10-CM | POA: Insufficient documentation

## 2018-08-11 DIAGNOSIS — R42 Dizziness and giddiness: Secondary | ICD-10-CM | POA: Diagnosis not present

## 2018-08-11 DIAGNOSIS — N183 Chronic kidney disease, stage 3 (moderate): Secondary | ICD-10-CM | POA: Insufficient documentation

## 2018-08-11 DIAGNOSIS — R51 Headache: Secondary | ICD-10-CM | POA: Diagnosis not present

## 2018-08-11 DIAGNOSIS — I130 Hypertensive heart and chronic kidney disease with heart failure and stage 1 through stage 4 chronic kidney disease, or unspecified chronic kidney disease: Secondary | ICD-10-CM | POA: Insufficient documentation

## 2018-08-11 DIAGNOSIS — I5022 Chronic systolic (congestive) heart failure: Secondary | ICD-10-CM | POA: Insufficient documentation

## 2018-08-11 DIAGNOSIS — Z03818 Encounter for observation for suspected exposure to other biological agents ruled out: Secondary | ICD-10-CM | POA: Diagnosis not present

## 2018-08-11 LAB — CBC WITH DIFFERENTIAL/PLATELET
Abs Immature Granulocytes: 0.01 10*3/uL (ref 0.00–0.07)
Basophils Absolute: 0 10*3/uL (ref 0.0–0.1)
Basophils Relative: 0 %
Eosinophils Absolute: 0.1 10*3/uL (ref 0.0–0.5)
Eosinophils Relative: 3 %
HCT: 45.4 % (ref 39.0–52.0)
Hemoglobin: 14 g/dL (ref 13.0–17.0)
Immature Granulocytes: 0 %
Lymphocytes Relative: 37 %
Lymphs Abs: 1.8 10*3/uL (ref 0.7–4.0)
MCH: 28.9 pg (ref 26.0–34.0)
MCHC: 30.8 g/dL (ref 30.0–36.0)
MCV: 93.8 fL (ref 80.0–100.0)
Monocytes Absolute: 0.7 10*3/uL (ref 0.1–1.0)
Monocytes Relative: 16 %
Neutro Abs: 2.1 10*3/uL (ref 1.7–7.7)
Neutrophils Relative %: 44 %
Platelets: 165 10*3/uL (ref 150–400)
RBC: 4.84 MIL/uL (ref 4.22–5.81)
RDW: 14.9 % (ref 11.5–15.5)
WBC: 4.7 10*3/uL (ref 4.0–10.5)
nRBC: 0 % (ref 0.0–0.2)

## 2018-08-11 LAB — COMPREHENSIVE METABOLIC PANEL
ALT: 30 U/L (ref 0–44)
AST: 25 U/L (ref 15–41)
Albumin: 3.8 g/dL (ref 3.5–5.0)
Alkaline Phosphatase: 93 U/L (ref 38–126)
Anion gap: 8 (ref 5–15)
BUN: 27 mg/dL — ABNORMAL HIGH (ref 8–23)
CO2: 26 mmol/L (ref 22–32)
Calcium: 9.1 mg/dL (ref 8.9–10.3)
Chloride: 105 mmol/L (ref 98–111)
Creatinine, Ser: 1.95 mg/dL — ABNORMAL HIGH (ref 0.61–1.24)
GFR calc Af Amer: 36 mL/min — ABNORMAL LOW (ref >60)
GFR calc non Af Amer: 31 mL/min — ABNORMAL LOW (ref >60)
Glucose, Bld: 83 mg/dL (ref 70–99)
Potassium: 4.1 mmol/L (ref 3.5–5.1)
Sodium: 139 mmol/L (ref 135–145)
Total Bilirubin: 0.9 mg/dL (ref 0.3–1.2)
Total Protein: 7.7 g/dL (ref 6.5–8.1)

## 2018-08-11 LAB — TROPONIN I (HIGH SENSITIVITY)
Troponin I (High Sensitivity): 17 ng/L (ref <18)
Troponin I (High Sensitivity): 15 ng/L (ref <18)

## 2018-08-11 LAB — BRAIN NATRIURETIC PEPTIDE: B Natriuretic Peptide: 348 pg/mL — ABNORMAL HIGH (ref 0.0–100.0)

## 2018-08-11 LAB — SARS CORONAVIRUS 2 BY RT PCR (HOSPITAL ORDER, PERFORMED IN ~~LOC~~ HOSPITAL LAB): SARS Coronavirus 2: NEGATIVE

## 2018-08-11 MED ORDER — ACETAMINOPHEN 500 MG PO TABS
1000.0000 mg | ORAL_TABLET | Freq: Once | ORAL | Status: AC
  Administered 2018-08-11: 1000 mg via ORAL
  Filled 2018-08-11: qty 2

## 2018-08-11 NOTE — ED Notes (Signed)
Patient transported to CT 

## 2018-08-11 NOTE — Discharge Instructions (Addendum)
Please follow-up with Dr. [name] and your PCP.  Please return for any additional questions or concerns.

## 2018-08-11 NOTE — ED Triage Notes (Signed)
Pt from home via EMS with c/o dizziness and SOB with exertion that started this am. Pt A&OX4, VSS, MD at bedside

## 2018-08-11 NOTE — ED Notes (Signed)
Spoke with spouse at this time for update

## 2018-08-11 NOTE — ED Provider Notes (Signed)
[facility] Emergency Department Provider Note    First MD Initiated Contact with Patient 08/11/18 1309     (approximate)  I have reviewed the triage vital signs and the nursing notes.   HISTORY  Chief Complaint Dizziness    HPI [name] is a 85 y.o. male plosive past medical history on Eliquis with history of CHF as well as A. fib presents the ER for evaluation of dizziness that occurred when he was bending forward this morning.  He also also reported shortness of breath and lower extremity swelling.  Patient denies any chest pain or palpitation at this time.  No cough.  Has not had any fevers.  No congestion.  Denies any numbness or tingling.  No blurry vision.  States he does have a mild headache.  Is not the worst headache of his life.  Was not sudden in onset.  States his been compliant with his medications.    Past Medical History:  Diagnosis Date  . Atrial fibrillation ([place])   . CHF (congestive heart failure) ([place])   . Diabetes ([place])   . Hearing loss   . High blood pressure   . History of bladder problems   . Kidney stones 11/22/2014   Family History  Problem Relation Age of Onset  . Colon cancer Mother   . Diabetes Other   . High blood pressure Other   . Prostate cancer Brother   . Diabetes Brother    Past Surgical History:  Procedure Laterality Date  . CATARACT EXTRACTION    . PROSTATE CANCER    . PROSTATE SURGERY     Patient Active Problem List   Diagnosis Date Noted  . Chronic mesenteric ischemia ([place]) 07/28/2018  . AAA (abdominal aortic aneurysm) without rupture ([place]) 07/28/2018  . Primary osteoarthritis of left hip 02/12/2018  . Bilateral hearing loss due to cerumen impaction 10/06/2017  . Chronic systolic heart failure ([place]) 08/19/2017  . Atrial fibrillation with RVR ([place]) 08/02/2017  . Acute CHF (congestive heart failure) ([place]) 08/02/2017  . Prostatic hypertrophy 07/20/2017  . Dysuria 07/20/2017  .  Hyperpigmentation 04/10/2017  . Uncontrolled type 2 diabetes mellitus with hyperglycemia ([place]) 03/24/2017  . Encounter for general adult medical examination with abnormal findings 03/24/2017  . Chest pain 05/04/2016  . Elevated troponin 05/04/2016  . Essential hypertension 07/06/2014  . Cough 03/21/2014  . OSA (obstructive sleep apnea) 03/21/2014  . Paroxysmal A-fib ([place]) 03/21/2014  . Hx of adenomatous colonic polyps 06/28/2013  . Liver cyst 06/28/2013  . Chronic kidney disease 06/08/2013  . Type 2 diabetes mellitus with stage 3 chronic kidney disease, without long-term current use of insulin ([place]) 06/08/2013  . Hyperlipidemia, mixed 06/08/2013  . Valvular heart disease 06/08/2013      Prior to Admission medications   Medication Sig Start Date End Date Taking? Authorizing Provider  apixaban (ELIQUIS) 5 MG TABS tablet Take 1 tablet by mouth 2 (two) times daily. 12/03/17   [provider]  atorvastatin (LIPITOR) 40 MG tablet Take 1 tablet (40 mg total) by mouth daily at 6 PM. 06/05/18   Boscia, Greer Ee, NP  carvedilol (COREG) 25 MG tablet Take 25 mg by mouth 2 (two) times daily with a meal.    [provider]  dicyclomine (BENTYL) 10 MG capsule Take 2 capsules (20 mg total) by mouth 3 (three) times daily before meals. 02/24/18   Lin Landsman, MD  furosemide (LASIX) 20 MG tablet Take 1 tablet (20 mg total)  by mouth daily. 12/31/17   Alisa Graff, FNP  ONETOUCH VERIO test strip USE UTD TO CHECK BLOOD SUGAR ONCE DX E11.65 02/13/18   Ronnell Freshwater, NP  pantoprazole (PROTONIX) 40 MG tablet TAKE 1 TABLET(40 MG) BY MOUTH DAILY 01/26/18   Ronnell Freshwater, NP  Semaglutide,0.25 or 0.5MG /DOS, (OZEMPIC, 0.25 OR 0.5 MG/DOSE,) 2 MG/1.5ML SOPN Inject 0.5 mg into the skin once a week. 05/11/18   Ronnell Freshwater, NP    Allergies Patient has no known allergies.    Social History Social History   Tobacco Use  . Smoking status: Former Smoker    Years: 16.00     Types: Cigarettes  . Smokeless tobacco: Never Used  Substance Use Topics  . Alcohol use: No  . Drug use: No    Review of Systems Patient denies headaches, rhinorrhea, blurry vision, numbness, shortness of breath, chest pain, edema, cough, abdominal pain, nausea, vomiting, diarrhea, dysuria, fevers, rashes or hallucinations unless otherwise stated above in HPI. ____________________________________________   PHYSICAL EXAM:  VITAL SIGNS: Vitals:   08/11/18 1445 08/11/18 1600  BP:    Pulse: 83 79  Resp: (!) 23 19  Temp:    SpO2: 97% 98%    Constitutional: Alert and oriented.  Eyes: Conjunctivae are normal.  Head: Atraumatic. Nose: No congestion/rhinnorhea. Mouth/Throat: Mucous membranes are moist.   Neck: No stridor. Painless ROM.  Cardiovascular: Normal rate, regular rhythm. Grossly normal heart sounds.  Good peripheral circulation. Respiratory: Normal respiratory effort.  No retractions. Lungs CTAB. Gastrointestinal: Soft and nontender. No distention. No abdominal bruits. No CVA tenderness. Genitourinary:  Musculoskeletal: No lower extremity tenderness nor edema.  No joint effusions. Neurologic:  Normal speech and language. No gross focal neurologic deficits are appreciated. No facial droop Skin:  Skin is warm, dry and intact. No rash noted. Psychiatric: Mood and affect are normal. Speech and behavior are normal. ____________________________________________   LABS (all labs ordered are listed, but only abnormal results are displayed)  Results for orders placed or performed during the hospital encounter of 08/11/18 (from the past 24 hour(s))  CBC with Differential/Platelet     Status: None   Collection Time: 08/11/18  1:11 PM  Result Value Ref Range   WBC 4.7 4.0 - 10.5 K/uL   RBC 4.84 4.22 - 5.81 MIL/uL   Hemoglobin 14.0 13.0 - 17.0 g/dL   HCT 45.4 39.0 - 52.0 %   MCV 93.8 80.0 - 100.0 fL   MCH 28.9 26.0 - 34.0 pg   MCHC 30.8 30.0 - 36.0 g/dL   RDW 14.9 11.5 - 15.5  %   Platelets 165 150 - 400 K/uL   nRBC 0.0 0.0 - 0.2 %   Neutrophils Relative % 44 %   Neutro Abs 2.1 1.7 - 7.7 K/uL   Lymphocytes Relative 37 %   Lymphs Abs 1.8 0.7 - 4.0 K/uL   Monocytes Relative 16 %   Monocytes Absolute 0.7 0.1 - 1.0 K/uL   Eosinophils Relative 3 %   Eosinophils Absolute 0.1 0.0 - 0.5 K/uL   Basophils Relative 0 %   Basophils Absolute 0.0 0.0 - 0.1 K/uL   Immature Granulocytes 0 %   Abs Immature Granulocytes 0.01 0.00 - 0.07 K/uL  Comprehensive metabolic panel     Status: Abnormal   Collection Time: 08/11/18  1:11 PM  Result Value Ref Range   Sodium 139 135 - 145 mmol/L   Potassium 4.1 3.5 - 5.1 mmol/L   Chloride 105 98 - 111 mmol/L  CO2 26 22 - 32 mmol/L   Glucose, Bld 83 70 - 99 mg/dL   BUN 27 (H) 8 - 23 mg/dL   Creatinine, Ser 1.95 (H) 0.61 - 1.24 mg/dL   Calcium 9.1 8.9 - 10.3 mg/dL   Total Protein 7.7 6.5 - 8.1 g/dL   Albumin 3.8 3.5 - 5.0 g/dL   AST 25 15 - 41 U/L   ALT 30 0 - 44 U/L   Alkaline Phosphatase 93 38 - 126 U/L   Total Bilirubin 0.9 0.3 - 1.2 mg/dL   GFR calc non Af Amer 31 (L) >60 mL/min   GFR calc Af Amer 36 (L) >60 mL/min   Anion gap 8 5 - 15  Troponin I (High Sensitivity)     Status: None   Collection Time: 08/11/18  1:11 PM  Result Value Ref Range   Troponin I (High Sensitivity) 17 <18 ng/L  Brain natriuretic peptide     Status: Abnormal   Collection Time: 08/11/18  1:11 PM  Result Value Ref Range   B Natriuretic Peptide 348.0 (H) 0.0 - 100.0 pg/mL  SARS Coronavirus 2 (CEPHEID- Performed in [place] hospital lab), Hosp Order     Status: None   Collection Time: 08/11/18  2:43 PM   Specimen: Nasopharyngeal Swab  Result Value Ref Range   SARS Coronavirus 2 NEGATIVE NEGATIVE  Troponin I (High Sensitivity)     Status: None   Collection Time: 08/11/18  2:43 PM  Result Value Ref Range   Troponin I (High Sensitivity) 15 <18 ng/L   ____________________________________________  EKG My review and personal interpretation at  Time: 13:07   Indication: diziness  Rate: 105  Rhythm: a fib Axis: left Other: lbbb, non specific st abn, no significant change as compared to previous ____________________________________________  RADIOLOGY  I personally reviewed all radiographic images ordered to evaluate for the above acute complaints and reviewed radiology reports and findings.  These findings were personally discussed with the patient.  Please see medical record for radiology report.  ____________________________________________   PROCEDURES  Procedure(s) performed:  Procedures    Critical Care performed: no ____________________________________________   INITIAL IMPRESSION / ASSESSMENT AND PLAN / ED COURSE  Pertinent labs & imaging results that were available during my care of the patient were reviewed by me and considered in my medical decision making (see chart for details).   DDX: dysrhythmia, acs, chf, electrolyte abn, covid 19, uti, unlikely cva  [name] is a 85 y.o. who presents to the ED with symptoms as described above.  Patient well-appearing in no acute distress.  Abdominal exam is soft and benign.  Neuro exam is nonfocal.  EKG shows chronic A. fib that is rate controlled.  Morphology is unchanged from previous.  Will check blood work to evaluate for the but differential.  The patient will be placed on continuous pulse oximetry and telemetry for monitoring.  Laboratory evaluation will be sent to evaluate for the above complaints.     Clinical Course as of Aug 11 1723  Tue Aug 11, 2018  1415 Patient able to ambulate without any hypoxia tachypnea or tachycardia but states that he did feel dizzy and lightheaded.   [PR]  1616 Patient feels much improved after having something to eat.  Denies any shortness of breath or chest pain.  Delta troponin without any change.  Does not seem consistent with ACS.  His abdominal exam is soft and benign.  He is tolerating oral hydration.  He is already  anticoagulated on Eliquis therefore not consistent with PE.   [PR]  1629 Discussed case with patient's wife after he consented to speak with her.  She states that she also called EMS due to have a chief complaint of feeling lightheaded and dizzy after he bent over to pick something up.  Symptoms resolved shortly after.  Not having any chest pain or pressure.  No numbness or tingling.  No slurred speech.  No facial droop.  No complaint of any blurry vision.   [PR]  1700 Patient able to ambulate with steady gait.  Feels well and requesting DC home.  Given reassuring workup, benign HINTs exam.  He has been observed in the ER on telemetry without any signs of RVR.  Have discussed with the patient and available family all diagnostics and treatments performed thus far and all questions were answered to the best of my ability. The patient demonstrates understanding and agreement with plan.    [PR]    Clinical Course User Index [PR] Merlyn Lot, MD    The patient was evaluated in Emergency Department today for the symptoms described in the history of present illness. He/she was evaluated in the context of the global COVID-19 pandemic, which necessitated consideration that the patient might be at risk for infection with the SARS-CoV-2 virus that causes COVID-19. Institutional protocols and algorithms that pertain to the evaluation of patients at risk for COVID-19 are in a state of rapid change based on information released by regulatory bodies including the CDC and federal and state organizations. These policies and algorithms were followed during the patient's care in the ED.  As part of my medical decision making, I reviewed the following data within the [place] notes reviewed and incorporated, Labs reviewed, notes from prior ED visits and [place] Controlled Substance Database   ____________________________________________   FINAL CLINICAL IMPRESSION(S) / ED  DIAGNOSES  Final diagnoses:  Dizziness      NEW MEDICATIONS STARTED DURING THIS VISIT:  New Prescriptions   No medications on file     Note:  This document was prepared using Dragon voice recognition software and may include unintentional dictation errors.    Merlyn Lot, MD 08/11/18 1725

## 2018-08-11 NOTE — ED Notes (Signed)
Attempted to call spouse at home number at this time for update with no answer. Will try again

## 2018-08-11 NOTE — ED Notes (Signed)
Pt ambulated around room and just outside room w/o incident. Spo2 remained at 100%, heart rate 62. Pt stated he was dizzy and "winded".

## 2018-08-11 NOTE — ED Notes (Signed)
Pt given snack and drink at this time. 

## 2018-08-11 NOTE — ED Notes (Signed)
Spouse made aware of pt upcoming discharged per MD Quentin Cornwall, states she will be here shortly to pick up pt

## 2018-08-11 NOTE — Progress Notes (Signed)
Today received a phone call from [place] wife explaining Rachael was not feeling good and legs had been swelling.  His supervisor sent him home yesterday and told him to take a week off due to swelling of his legs.  Legs have went down today, he states weight has been same. Lung sound clear but hard to hear, could have some fluid built up. Has some abdominal tightness.  He rested and elevated them yesterday.  He watches his diet and fluid intake.  Vitals are stable except his pulse is irregular with rates of 80's to 120's.  Monitor showing afib.  He states was dizziy earlier today when stood.  Spoke with Darylene Price at [facility] clinic and she could see him Thursday and she state contact [place] office to see if they could see him sooner.  Gigi Gin was contacted and could see him at 2:00 today.  After asking him to stand and walk he became short of breath, dizzy and not feeling good.  Contacted EMS for ambulance and sent him to ED.  Contact [place] and [place] and advised them both. He has been taking his medications everyday.  Will continue to visit for heart failure.  Yates Decamp [employer] EMT-Paramedic [phone]

## 2018-08-12 ENCOUNTER — Other Ambulatory Visit: Payer: Self-pay

## 2018-08-12 NOTE — Progress Notes (Deleted)
Patient ID: [name], male    DOB: [birth year], 85 y.o.   MRN: [id number]  HPI  [name] is a 85 y/o male with a history of DM, HTN, CKD, atrial fibrillation, previous tobacco use and chronic heart failure.   Echo report from 08/02/17 reviewed and showed an EF of 35-40% along with mild Glenn.   Was in the ED 08/10/2018 due to dizziness where he was treated and released.   He presents today for a follow-up visit with a chief complaint of   Past Medical History:  Diagnosis Date  . Atrial fibrillation ([place])   . CHF (congestive heart failure) ([place])   . Diabetes ([place])   . Hearing loss   . High blood pressure   . History of bladder problems   . Kidney stones 11/22/2014   Past Surgical History:  Procedure Laterality Date  . CATARACT EXTRACTION    . PROSTATE CANCER    . PROSTATE SURGERY     Family History  Problem Relation Age of Onset  . Colon cancer Mother   . Diabetes Other   . High blood pressure Other   . Prostate cancer Brother   . Diabetes Brother    Social History   Tobacco Use  . Smoking status: Former Smoker    Years: 16.00    Types: Cigarettes  . Smokeless tobacco: Never Used  Substance Use Topics  . Alcohol use: No   No Known Allergies    Review of Systems  Constitutional: Positive for fatigue. Negative for appetite change.  HENT: Negative for congestion, postnasal drip and sore throat.   Eyes: Negative.   Respiratory: Positive for cough (dry cough) and shortness of breath. Negative for chest tightness.   Cardiovascular: Negative for chest pain, palpitations and leg swelling.  Gastrointestinal: Negative for abdominal distention, abdominal pain, anal bleeding and blood in stool.  Endocrine: Negative.   Genitourinary: Negative.   Musculoskeletal: Positive for neck pain (knot for many years). Negative for back pain.  Skin: Negative.   Allergic/Immunologic: Negative.   Neurological: Negative for dizziness and light-headedness.  Hematological:  Negative for adenopathy. Does not bruise/bleed easily.  Psychiatric/Behavioral: Negative for dysphoric mood and sleep disturbance. The patient is not nervous/anxious.     Physical Exam Vitals signs and nursing note reviewed.  Constitutional:      Appearance: He is well-developed.  HENT:     Head: Normocephalic and atraumatic.  Neck:     Musculoskeletal: Normal range of motion and neck supple.     Vascular: No JVD.  Cardiovascular:     Rate and Rhythm: Tachycardia present. Rhythm irregular.  Pulmonary:     Effort: Pulmonary effort is normal. No respiratory distress.     Breath sounds: No wheezing or rales.  Abdominal:     General: There is no distension.     Palpations: Abdomen is soft.  Musculoskeletal:     Right lower leg: He exhibits no tenderness. No edema.     Left lower leg: He exhibits no tenderness. No edema.  Skin:    General: Skin is warm and dry.  Neurological:     Mental Status: He is alert and oriented to person, place, and time.  Psychiatric:        Behavior: Behavior normal.    Assessment & Plan:  1: Chronic heart failure with reduced ejection fraction- - NYHA class II - euvolemic today - weighing daily and he was reminded to call for an overnight weight gain of >2 pounds  or a weekly weight gain of >5 pounds - weight 255 from last visit here 5 months ago - not adding salt and his wife has been reading food labels. Reminded to closely follow a 2000mg  sodium diet  - saw cardiology [name]) 04/27/2018 - wearing compression socks daily - drinking ~ 40+ ounces of fluids daily - wearing oxygen at 2L at bedtime - BNP 08/11/2018 was 348.0 - currently participating in paramedicine program    2: HTN- - BP  - saw PCP [name]) 05/11/2018 - BMP 08/11/2018 reviewed and showed sodium 139, potassium 4.1, creatinine 1.95 and GFR 36  3: DM- - glucose at home was  - A1c 01/08/18 was 7.4%   Medication bottles were reviewed.

## 2018-08-13 ENCOUNTER — Ambulatory Visit: Payer: Medicare Other | Admitting: Family

## 2018-08-13 ENCOUNTER — Other Ambulatory Visit: Payer: Self-pay

## 2018-08-13 NOTE — Patient Outreach (Signed)
[facility]) Care Management  08/13/2018  [name] 02-May-1933 [id number]   Referral received from [facility] team for complex case management. Attempted outreach with [name]. Call answered by another member in the home. Reported that [name] was not available. Agreed to relay message to return the call when he is available.  PLAN -Will follow-up within 3-4 business days.  [place] [phone]

## 2018-08-14 ENCOUNTER — Ambulatory Visit: Payer: Medicare Other | Admitting: Family

## 2018-08-18 ENCOUNTER — Encounter: Payer: Self-pay | Admitting: Family

## 2018-08-18 ENCOUNTER — Other Ambulatory Visit: Payer: Self-pay

## 2018-08-18 ENCOUNTER — Ambulatory Visit: Payer: Medicare Other | Attending: Family | Admitting: Family

## 2018-08-18 VITALS — BP 111/77 | HR 54 | Resp 18 | Ht 72.0 in | Wt 243.2 lb

## 2018-08-18 DIAGNOSIS — Z87891 Personal history of nicotine dependence: Secondary | ICD-10-CM | POA: Diagnosis not present

## 2018-08-18 DIAGNOSIS — Z9981 Dependence on supplemental oxygen: Secondary | ICD-10-CM | POA: Insufficient documentation

## 2018-08-18 DIAGNOSIS — Z8042 Family history of malignant neoplasm of prostate: Secondary | ICD-10-CM | POA: Diagnosis not present

## 2018-08-18 DIAGNOSIS — I4891 Unspecified atrial fibrillation: Secondary | ICD-10-CM | POA: Diagnosis not present

## 2018-08-18 DIAGNOSIS — N189 Chronic kidney disease, unspecified: Secondary | ICD-10-CM | POA: Diagnosis not present

## 2018-08-18 DIAGNOSIS — Z833 Family history of diabetes mellitus: Secondary | ICD-10-CM | POA: Insufficient documentation

## 2018-08-18 DIAGNOSIS — I130 Hypertensive heart and chronic kidney disease with heart failure and stage 1 through stage 4 chronic kidney disease, or unspecified chronic kidney disease: Secondary | ICD-10-CM | POA: Insufficient documentation

## 2018-08-18 DIAGNOSIS — Z87442 Personal history of urinary calculi: Secondary | ICD-10-CM | POA: Insufficient documentation

## 2018-08-18 DIAGNOSIS — I5022 Chronic systolic (congestive) heart failure: Secondary | ICD-10-CM | POA: Insufficient documentation

## 2018-08-18 DIAGNOSIS — Z800 Family history of malignant neoplasm of digestive organs: Secondary | ICD-10-CM | POA: Insufficient documentation

## 2018-08-18 DIAGNOSIS — Z8249 Family history of ischemic heart disease and other diseases of the circulatory system: Secondary | ICD-10-CM | POA: Insufficient documentation

## 2018-08-18 DIAGNOSIS — Z8546 Personal history of malignant neoplasm of prostate: Secondary | ICD-10-CM | POA: Insufficient documentation

## 2018-08-18 DIAGNOSIS — E1122 Type 2 diabetes mellitus with diabetic chronic kidney disease: Secondary | ICD-10-CM | POA: Diagnosis not present

## 2018-08-18 DIAGNOSIS — Z79899 Other long term (current) drug therapy: Secondary | ICD-10-CM | POA: Insufficient documentation

## 2018-08-18 DIAGNOSIS — Z7901 Long term (current) use of anticoagulants: Secondary | ICD-10-CM | POA: Insufficient documentation

## 2018-08-18 DIAGNOSIS — I509 Heart failure, unspecified: Secondary | ICD-10-CM | POA: Diagnosis present

## 2018-08-18 DIAGNOSIS — I10 Essential (primary) hypertension: Secondary | ICD-10-CM

## 2018-08-18 DIAGNOSIS — H9190 Unspecified hearing loss, unspecified ear: Secondary | ICD-10-CM | POA: Insufficient documentation

## 2018-08-18 NOTE — Patient Instructions (Signed)
Continue weighing daily and call for an overnight weight gain of > 2 pounds or a weekly weight gain of >5 pounds. 

## 2018-08-18 NOTE — Progress Notes (Signed)
Patient ID: [name], male    DOB: March 04, 1933, 85 y.o.   MRN: [id number]  HPI  Mr Radi is a 85 y/o male with a history of DM, HTN, CKD, atrial fibrillation, previous tobacco use and chronic heart failure.   Echo report from 08/02/17 reviewed and showed an EF of 35-40% along with mild MR.   Was in the ED 08/11/2018 due to dizziness where he was treated and released.   He presents today for a follow-up visit with a chief complaint of minimal shortness of breath upon moderate exertion. He has associated fatigue and cough along with this. He denies any difficulty sleeping, dizziness, difficulty sleeping, abdominal distention, palpitations, pedal edema, chest pain or weight gain.   Past Medical History:  Diagnosis Date  . Atrial fibrillation ([place])   . CHF (congestive heart failure) ([place])   . Diabetes ([place])   . Hearing loss   . High blood pressure   . History of bladder problems   . Kidney stones 11/22/2014   Past Surgical History:  Procedure Laterality Date  . CATARACT EXTRACTION    . PROSTATE CANCER    . PROSTATE SURGERY     Family History  Problem Relation Age of Onset  . Colon cancer Mother   . Diabetes Other   . High blood pressure Other   . Prostate cancer Brother   . Diabetes Brother    Social History   Tobacco Use  . Smoking status: Former Smoker    Years: 16.00    Types: Cigarettes  . Smokeless tobacco: Never Used  Substance Use Topics  . Alcohol use: No   No Known Allergies  Prior to Admission medications   Medication Sig Start Date End Date Taking? Authorizing Provider  apixaban (ELIQUIS) 5 MG TABS tablet Take 1 tablet by mouth 2 (two) times daily. 12/03/17  Yes [provider]  atorvastatin (LIPITOR) 40 MG tablet Take 1 tablet (40 mg total) by mouth daily at 6 PM. 06/05/18  Yes Boscia, Heather E, NP  carvedilol (COREG) 25 MG tablet Take 25 mg by mouth 2 (two) times daily with a meal.   Yes [provider]  dicyclomine (BENTYL) 10  MG capsule Take 2 capsules (20 mg total) by mouth 3 (three) times daily before meals. 02/24/18  Yes Vanga, Tally Due, MD  furosemide (LASIX) 20 MG tablet Take 1 tablet (20 mg total) by mouth daily. 12/31/17  Yes Alisa Graff, FNP  ONETOUCH VERIO test strip USE UTD TO CHECK BLOOD SUGAR ONCE DX E11.65 02/13/18  Yes Boscia, Heather E, NP  pantoprazole (PROTONIX) 40 MG tablet TAKE 1 TABLET(40 MG) BY MOUTH DAILY 01/26/18  Yes Boscia, Heather E, NP  Semaglutide,0.25 or 0.5MG /DOS, (OZEMPIC, 0.25 OR 0.5 MG/DOSE,) 2 MG/1.5ML SOPN Inject 0.5 mg into the skin once a week. 05/11/18  Yes Ronnell Freshwater, NP    Review of Systems  Constitutional: Positive for fatigue. Negative for appetite change.  HENT: Negative for congestion, postnasal drip and sore throat.   Eyes: Negative.   Respiratory: Positive for cough (dry cough) and shortness of breath (improving). Negative for chest tightness.   Cardiovascular: Negative for chest pain, palpitations and leg swelling.  Gastrointestinal: Negative for abdominal distention, abdominal pain, anal bleeding and blood in stool.  Endocrine: Negative.   Genitourinary: Negative.   Musculoskeletal: Positive for neck pain. Negative for back pain.  Skin: Negative.   Allergic/Immunologic: Negative.   Neurological: Negative for dizziness and light-headedness.  Hematological: Negative for adenopathy.  Does not bruise/bleed easily.  Psychiatric/Behavioral: Negative for dysphoric mood and sleep disturbance. The patient is not nervous/anxious.    Vitals:   08/18/18 1411  BP: 111/77  Pulse: (!) 54  Resp: 18  SpO2: 99%  Weight: 243 lb 4 oz (110.3 kg)  Height: 6' (1.829 m)   Wt Readings from Last 3 Encounters:  08/18/18 243 lb 4 oz (110.3 kg)  08/11/18 246 lb (111.6 kg)  07/28/18 251 lb (113.9 kg)   Lab Results  Component Value Date   CREATININE 1.95 (H) 08/11/2018   CREATININE 2.04 (H) 04/13/2018   CREATININE 2.40 (H) 04/10/2018    Physical Exam Vitals signs and  nursing note reviewed.  Constitutional:      Appearance: He is well-developed.  HENT:     Head: Normocephalic and atraumatic.  Neck:     Musculoskeletal: Normal range of motion and neck supple.     Vascular: No JVD.  Cardiovascular:     Rate and Rhythm: Tachycardia present. Rhythm irregular.  Pulmonary:     Effort: Pulmonary effort is normal. No respiratory distress.     Breath sounds: No wheezing or rales.  Abdominal:     General: There is no distension.     Palpations: Abdomen is soft.  Musculoskeletal:     Right lower leg: He exhibits no tenderness. Edema (trace pitting) present.     Left lower leg: He exhibits no tenderness. Edema (trace pitting) present.  Skin:    General: Skin is warm and dry.  Neurological:     Mental Status: He is alert and oriented to person, place, and time.  Psychiatric:        Behavior: Behavior normal.    Assessment & Plan:  1: Chronic heart failure with reduced ejection fraction- - NYHA class II - euvolemic today - weighing daily and he was reminded to call for an overnight weight gain of >2 pounds or a weekly weight gain of >5 pounds - weight down 12 pounds from last visit here 5 months ago - not adding salt and his wife has been reading food labels. Reminded to closely follow a 2000mg  sodium diet  - saw cardiology [name]) 04/27/2018 - wearing compression socks daily - drinking ~ 40+ ounces of fluids daily - wearing oxygen at 2L at bedtime - BNP 08/11/2018 was 348.0 - currently participating in paramedicine program  - advised patient that he could take a second lasix as needed for above weight gain or worsening edema  2: HTN- - BP looks good today although on the low side - saw PCP [name]) 05/11/2018 - BMP 08/11/2018 reviewed and showed sodium 139, potassium 4.1, creatinine 1.95 and GFR 36  3: DM- - glucose at home was 122 - A1c 01/08/18 was 7.4%   Medication bottles were reviewed.  Return in 2 months or sooner for any  questions/problems before then.

## 2018-08-19 ENCOUNTER — Other Ambulatory Visit: Payer: Self-pay

## 2018-08-19 NOTE — Patient Outreach (Signed)
[facility]) Care Management  08/19/2018  [name] 1933/07/24 [id number]    Unsuccessful outreach attempt. Will follow-up within 3-4 business days.      [place] [phone]

## 2018-08-19 NOTE — Patient Outreach (Signed)
[place] [facility]) Care Management  08/19/2018  [name] July 09, 1933 [id number]   Medication Adherence call to [name] Compliant Voice message left with a call back number. [name] is showing past due on Ozempic under [place].   [place] Management Direct Dial [phone]  Fax [phone] [email or web address]

## 2018-08-20 DIAGNOSIS — G4733 Obstructive sleep apnea (adult) (pediatric): Secondary | ICD-10-CM | POA: Diagnosis not present

## 2018-08-25 ENCOUNTER — Other Ambulatory Visit: Payer: Self-pay

## 2018-08-25 MED ORDER — PANTOPRAZOLE SODIUM 40 MG PO TBEC: DELAYED_RELEASE_TABLET | ORAL | 6 refills | Status: DC

## 2018-08-25 NOTE — Patient Outreach (Addendum)
[name] [facility]) Care Management  08/25/2018  [name] 07-23-1933 [id number]    Attempted outreach with [name]. Spoke with his wife, [name]. HIPAA identifiers verified. Per [name], he was working at the time of the call. She indicated that he does not return home until after 5pm during the week but would be available on next Friday. She agreed to relay the message regarding the call. Telephonic assessment scheduled for 09/04/18.   PLAN -Will follow-up next week.   [name] [phone]

## 2018-08-26 ENCOUNTER — Telehealth (HOSPITAL_COMMUNITY): Payer: Self-pay

## 2018-08-26 NOTE — Telephone Encounter (Signed)
Today had a telephone visit with [name] and his wife.  BGL 129.  He states back at work, been feeling much better.  Has all his medications and aware of how to take them.  Aware of up coming appts.  He denies swelling in extremities.  Watches his high sodium foods.  Watches his fluid intake.  Is cutting back on his work some, going to 4 days a week instead of 5 days.  Denies chest pain, shortness of breath, headaches or dizziness.  Verified his meds.  Will continue to visit for heart failure.   [place] [phone]

## 2018-08-28 NOTE — Telephone Encounter (Signed)
This encounter was created in error - please disregard.

## 2018-09-02 ENCOUNTER — Other Ambulatory Visit: Payer: Self-pay

## 2018-09-02 NOTE — Patient Outreach (Signed)
[place] [facility]) Care Management  09/02/2018  [name] [birth year] [id number]   Medication Adherence call to [name] spoke with patients wife she explain patient is taking Ozempic once a week and is receiving samples from doctors office because he is in the donut hole.[name] is showing past due under [place].   [place] Management Direct Dial [phone]  Fax [phone] [email or web address]

## 2018-09-03 ENCOUNTER — Ambulatory Visit (INDEPENDENT_AMBULATORY_CARE_PROVIDER_SITE_OTHER): Payer: Medicare Other | Admitting: Podiatry

## 2018-09-03 ENCOUNTER — Other Ambulatory Visit: Payer: Self-pay

## 2018-09-03 ENCOUNTER — Encounter: Payer: Self-pay | Admitting: Podiatry

## 2018-09-03 DIAGNOSIS — M79676 Pain in unspecified toe(s): Secondary | ICD-10-CM | POA: Diagnosis not present

## 2018-09-03 DIAGNOSIS — E119 Type 2 diabetes mellitus without complications: Secondary | ICD-10-CM

## 2018-09-03 DIAGNOSIS — B351 Tinea unguium: Secondary | ICD-10-CM | POA: Diagnosis not present

## 2018-09-03 NOTE — Progress Notes (Signed)
Complaint:  Visit Type: Patient returns to my office for continued preventative foot care services. Complaint: Patient states" my nails have grown long and thick and become painful to walk and wear shoes" Patient has been diagnosed with DM with no foot complications. The patient presents for preventative foot care services. No changes to ROS.    Podiatric Exam: Vascular: dorsalis pedis and posterior tibial pulses are weakly  palpable bilateral. Capillary return is immediate. Temperature gradient is WNL. Skin turgor WNL  Sensorium: Normal Semmes Weinstein monofilament test. Normal tactile sensation bilaterally. Nail Exam: Pt has thick disfigured discolored nails with subungual debris noted bilateral entire nail hallux through fifth toenails Ulcer Exam: There is no evidence of ulcer or pre-ulcerative changes or infection. Orthopedic Exam: Muscle tone and strength are WNL. No limitations in general ROM. No crepitus or effusions noted. Foot type and digits show no abnormalities. Bony prominences are unremarkable. Skin:  Porokeratosis sub 3 right asymptomatic..  No infection noted. No infection or ulcers  Diagnosis:  Onychomycosis, , Pain in right toe, pain in left toes  Porokeratosis  Treatment & Plan Procedures and Treatment: Consent by patient was obtained for treatment procedures. The patient understood the discussion of treatment and procedures well. All questions were answered thoroughly reviewed. Debridement of mycotic and hypertrophic toenails, 1 through 5 bilateral and clearing of subungual debris. No ulceration, no infection noted.     Return Visit-Office Procedure: Patient instructed to return to the office for a follow up visit 10 weeks  for continued evaluation and treatment.    Jaan Fischel DPM 

## 2018-09-04 ENCOUNTER — Other Ambulatory Visit: Payer: Self-pay

## 2018-09-04 NOTE — Patient Outreach (Signed)
[facility]) Care Management  09/04/2018  [name] [birth year] [id number]    Attempted outreach to complete telephonic assessment. Spoke with [name]'s spouse, [name]. HIPAA identifiers verified. Per [name], [name] was not available at the time of the call. Agreed to relay message to return the call when he is available.   PLAN -Pending return call. -Will follow-up within 3-4 business days if call is not returned.   [place] [phone]

## 2018-09-10 ENCOUNTER — Ambulatory Visit: Payer: Medicare Other | Admitting: Family

## 2018-09-11 ENCOUNTER — Other Ambulatory Visit: Payer: Self-pay

## 2018-09-11 NOTE — Patient Outreach (Signed)
[place] [facility]) Care Management  09/11/2018  FITZROY MIKAMI 1933-12-19 [id number]   Successful outreach with Mr. Nagy. HIPAA identifiers verified. He denies complaints of shortness of breath, pain, chest discomfort or palpitations. Reports feeling well today.  Discussed pending referral and previous conversations with his wife, [name]. Mr. Quintanar declines current need for care management services. Per chart review, he is engaged with [employer]. Reports compliance with medications and treatment recommendations. Reports attending provider appointments as scheduled.  Reports his primary concern is prescription costs but he is currently in the coverage gap. Per chart review, [place] Technician was able to discuss this with his wife.  Mr. Causby reports ambulating without assistive device and performing ADLs independently. Denies need for assistance in the home. Declines need for nutritional or transportation assistance. He intends to keep working 4 days a week. He denies urgent concerns. Reports he will outreach if his health status changes and assistance is needed.  PLAN -Will complete case closure.  [place] Care Management [phone]

## 2018-09-18 DIAGNOSIS — I5022 Chronic systolic (congestive) heart failure: Secondary | ICD-10-CM | POA: Diagnosis not present

## 2018-09-18 DIAGNOSIS — I10 Essential (primary) hypertension: Secondary | ICD-10-CM | POA: Diagnosis not present

## 2018-09-18 DIAGNOSIS — R600 Localized edema: Secondary | ICD-10-CM | POA: Diagnosis not present

## 2018-09-18 DIAGNOSIS — E782 Mixed hyperlipidemia: Secondary | ICD-10-CM | POA: Diagnosis not present

## 2018-09-18 DIAGNOSIS — I482 Chronic atrial fibrillation, unspecified: Secondary | ICD-10-CM | POA: Diagnosis not present

## 2018-09-20 DIAGNOSIS — G4733 Obstructive sleep apnea (adult) (pediatric): Secondary | ICD-10-CM | POA: Diagnosis not present

## 2018-09-21 ENCOUNTER — Other Ambulatory Visit (HOSPITAL_COMMUNITY): Payer: Self-pay

## 2018-09-21 ENCOUNTER — Encounter (HOSPITAL_COMMUNITY): Payer: Self-pay

## 2018-09-21 NOTE — Progress Notes (Signed)
BGL 132   Today was a telephone appt.  He states is doing better, His cardiologist has cut is eliquis in half.  He denies chest pain, shortness of breath, headaches or dizziness.  He is aware of up coming appts.  Working again and is going good.  His sugar has been good, in the lower 100's everyday.  Has some swelling but goes down at night.  Wears stockings when works.  Meds verified and he is aware of how to take and when.  He watches diet very close with his wifes help.  He watches his fluid in take.  Will continue to visit for heart failure.   [place] [phone]

## 2018-09-25 DIAGNOSIS — I5022 Chronic systolic (congestive) heart failure: Secondary | ICD-10-CM | POA: Diagnosis not present

## 2018-09-25 DIAGNOSIS — I482 Chronic atrial fibrillation, unspecified: Secondary | ICD-10-CM | POA: Diagnosis not present

## 2018-10-01 DIAGNOSIS — I10 Essential (primary) hypertension: Secondary | ICD-10-CM | POA: Diagnosis not present

## 2018-10-01 DIAGNOSIS — R600 Localized edema: Secondary | ICD-10-CM | POA: Diagnosis not present

## 2018-10-01 DIAGNOSIS — I482 Chronic atrial fibrillation, unspecified: Secondary | ICD-10-CM | POA: Diagnosis not present

## 2018-10-01 DIAGNOSIS — E782 Mixed hyperlipidemia: Secondary | ICD-10-CM | POA: Diagnosis not present

## 2018-10-01 DIAGNOSIS — I5022 Chronic systolic (congestive) heart failure: Secondary | ICD-10-CM | POA: Diagnosis not present

## 2018-10-13 ENCOUNTER — Telehealth (HOSPITAL_COMMUNITY): Payer: Self-pay

## 2018-10-13 NOTE — Telephone Encounter (Signed)
Today was a visit with [name] and his wife.  They states he has been doing well, had a PCP appt and she stated he was doing good.  His sugar has been running 90-120's since started ozempic.  CHF appears stable.  He has all his medications and he knows how to place meds in a weekly med box.  He knows how to take his medications and how.  He watches his diet and fluids.  He is back working full time.  Advised both that I will be discharging him from the program and they are welcome to keep my number and any problems they can call me.  Darylene Price with HF clinic is aware of discharge and agrees with the plan.  If any problems in the future can always add him back.  They appear to understand.   [place] [phone]

## 2018-10-15 ENCOUNTER — Ambulatory Visit: Payer: Medicare Other | Admitting: Family

## 2018-10-16 ENCOUNTER — Ambulatory Visit: Payer: Medicare Other | Admitting: Family

## 2018-10-21 DIAGNOSIS — G4733 Obstructive sleep apnea (adult) (pediatric): Secondary | ICD-10-CM | POA: Diagnosis not present

## 2018-10-23 ENCOUNTER — Encounter: Payer: Self-pay | Admitting: Nurse Practitioner

## 2018-10-23 ENCOUNTER — Ambulatory Visit (INDEPENDENT_AMBULATORY_CARE_PROVIDER_SITE_OTHER): Payer: Medicare Other | Admitting: Nurse Practitioner

## 2018-10-23 ENCOUNTER — Other Ambulatory Visit: Payer: Self-pay

## 2018-10-23 VITALS — BP 142/80 | HR 73 | Temp 95.6°F | Resp 16 | Ht 72.0 in | Wt 247.0 lb

## 2018-10-23 DIAGNOSIS — I714 Abdominal aortic aneurysm, without rupture, unspecified: Secondary | ICD-10-CM

## 2018-10-23 DIAGNOSIS — I10 Essential (primary) hypertension: Secondary | ICD-10-CM

## 2018-10-23 DIAGNOSIS — Z23 Encounter for immunization: Secondary | ICD-10-CM | POA: Insufficient documentation

## 2018-10-23 DIAGNOSIS — I214 Non-ST elevation (NSTEMI) myocardial infarction: Secondary | ICD-10-CM | POA: Insufficient documentation

## 2018-10-23 DIAGNOSIS — D6869 Other thrombophilia: Secondary | ICD-10-CM | POA: Insufficient documentation

## 2018-10-23 DIAGNOSIS — I248 Other forms of acute ischemic heart disease: Secondary | ICD-10-CM | POA: Diagnosis not present

## 2018-10-23 DIAGNOSIS — E1165 Type 2 diabetes mellitus with hyperglycemia: Secondary | ICD-10-CM | POA: Diagnosis not present

## 2018-10-23 DIAGNOSIS — I4891 Unspecified atrial fibrillation: Secondary | ICD-10-CM

## 2018-10-23 LAB — POCT GLYCOSYLATED HEMOGLOBIN (HGB A1C): Hemoglobin A1C: 7.1 % — AB (ref 4.0–5.6)

## 2018-10-23 MED ORDER — PNEUMOCOCCAL 13-VAL CONJ VACC IM SUSP: 0.5000 mL | Freq: Once | INTRAMUSCULAR | 0 refills | Status: AC

## 2018-10-23 NOTE — Progress Notes (Signed)
[facility] [place], [place] [address]  Internal MEDICINE  Office Visit Note  Patient Name: [name]  [id number]  [id number]  Date of Service: 10/23/2018  Chief Complaint  Patient presents with  . Diabetes  . Hypertension  . Quality Metric Gaps    uhc gap list, pna vacc and diabetic eye exam    The patient is here for routine follow up visit of diabetes. Was started on ozempic 0.25mg  weekly. His blood sugars are improving. His HgbA1c 7.1 down from 7.5 at his last check and 7.4 at check prior to that. He does get some bloating with this. He has lost 5 pounds since his last visit.  The patient does have chronic a-fib. He sees Dr. [name] for this. He takes eliquis for this and this is managed per Dr. [name]. He recently had echocardiogram done per cardiology. Continues to have mild systolic dysfunction with normal right ventricular function. Has moderate valvular regurgitation without valvular stenosis.       Current Medication: Outpatient Encounter Medications as of 10/23/2018  Medication Sig  . apixaban (ELIQUIS) 5 MG TABS tablet Take 2.5 mg by mouth 2 (two) times daily. Per [name] change to half a tablet twice daily  . atorvastatin (LIPITOR) 40 MG tablet Take 1 tablet (40 mg total) by mouth daily at 6 PM.  . carvedilol (COREG) 25 MG tablet Take 25 mg by mouth 2 (two) times daily with a meal.  . dicyclomine (BENTYL) 10 MG capsule Take 2 capsules (20 mg total) by mouth 3 (three) times daily before meals.  . furosemide (LASIX) 20 MG tablet Take 1 tablet (20 mg total) by mouth daily.  Glory Rosebush VERIO test strip USE UTD TO CHECK BLOOD SUGAR ONCE DX E11.65  . pantoprazole (PROTONIX) 40 MG tablet TAKE 1 TABLET(40 MG) BY MOUTH DAILY  . Semaglutide,0.25 or 0.5MG /DOS, (OZEMPIC, 0.25 OR 0.5 MG/DOSE,) 2 MG/1.5ML SOPN Inject 0.5 mg into the skin once a week.  . pneumococcal 13-valent conjugate vaccine (PREVNAR 13) SUSP injection Inject 0.5 mLs into the  muscle once for 1 dose.   No facility-administered encounter medications on file as of 10/23/2018.     Surgical History: Past Surgical History:  Procedure Laterality Date  . CATARACT EXTRACTION    . PROSTATE CANCER    . PROSTATE SURGERY      Medical History: Past Medical History:  Diagnosis Date  . Atrial fibrillation ([place])   . CHF (congestive heart failure) ([place])   . Diabetes ([place])   . Hearing loss   . High blood pressure   . History of bladder problems   . Kidney stones 11/22/2014    Family History: Family History  Problem Relation Age of Onset  . Colon cancer Mother   . Diabetes Other   . High blood pressure Other   . Prostate cancer Brother   . Diabetes Brother     Social History   Socioeconomic History  . Marital status: Married    Spouse name: Not on file  . Number of children: Not on file  . Years of education: Not on file  . Highest education level: Not on file  Occupational History  . 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 Financial resource strain: Not hard at all  . Food insecurity    Worry: Never true    Inability: Never true  . Transportation needs    Medical: No    Non-medical: No  Tobacco Use  . Smoking status: Former  Smoker    Years: 16.00    Types: Cigarettes  . Smokeless tobacco: Never Used  Substance and Sexual Activity  . Alcohol use: No  . Drug use: No  . Sexual activity: Not Currently  Lifestyle  . Physical activity    Days per week: 0 days    Minutes per session: 0 min  . Stress: Not at all  Relationships  . Social Herbalist on phone: Twice a week    Gets together: Never    Attends religious service: 1 to 4 times per year    Active member of club or organization: No    Attends meetings of clubs or organizations: Never    Relationship status: Married  . Intimate partner violence    Fear of current or ex partner: No    Emotionally abused: No    Physically abused: No    Forced sexual activity: No  Other Topics  Concern  . Not on file  Social History Narrative  . Not on file      Review of Systems  Constitutional: Negative for activity change, chills, fatigue and unexpected weight change.  HENT: Negative for congestion, postnasal drip, rhinorrhea, sneezing, sore throat and voice change.   Respiratory: Negative for cough, chest tightness, shortness of breath and wheezing.   Cardiovascular: Negative for chest pain and palpitations.       Chronic a-fib.   Gastrointestinal: Negative for abdominal pain, constipation, diarrhea, nausea and vomiting.       Frequent bowel movements, mostly after he eats. Dr. [name] said this may be due to side effects from eliquis   Endocrine: Negative for cold intolerance, heat intolerance, polydipsia and polyuria.       Impoved blood sugars since starting on ozempic.   Musculoskeletal: Negative for arthralgias, back pain, joint swelling and neck pain.  Skin: Negative for rash.  Allergic/Immunologic: Negative for environmental allergies.  Neurological: Negative for dizziness, tremors, numbness and headaches.  Hematological: Negative for adenopathy. Does not bruise/bleed easily.  Psychiatric/Behavioral: Negative for behavioral problems (Depression), sleep disturbance and suicidal ideas. The patient is not nervous/anxious.     Today's Vitals   10/23/18 0839  BP: (!) 142/80  Pulse: 73  Resp: 16  Temp: (!) 95.6 F (35.3 C)  SpO2: 98%  Weight: 247 lb (112 kg)  Height: 6' (1.829 m)   Body mass index is 33.5 kg/m.  Physical Exam Vitals signs and nursing note reviewed.  Constitutional:      General: He is not in acute distress.    Appearance: Normal appearance. He is well-developed. He is not diaphoretic.  HENT:     Head: Normocephalic and atraumatic.     Nose: Nose normal.     Mouth/Throat:     Pharynx: No oropharyngeal exudate.  Eyes:     Conjunctiva/sclera: Conjunctivae normal.     Pupils: Pupils are equal, round, and reactive to light.  Neck:      Musculoskeletal: Normal range of motion and neck supple.     Thyroid: No thyromegaly.     Vascular: No carotid bruit or JVD.     Trachea: No tracheal deviation.  Cardiovascular:     Rate and Rhythm: Normal rate. Rhythm irregular.     Pulses:          Dorsalis pedis pulses are 1+ on the right side and 1+ on the left side.       Posterior tibial pulses are 1+ on the right side and 1+ on  the left side.     Heart sounds: Murmur present. No friction rub. No gallop.      Comments: Irregular heart rhythm with soft, blowing murmur. Pulmonary:     Effort: Pulmonary effort is normal. No respiratory distress.     Breath sounds: Normal breath sounds. No wheezing or rales.  Chest:     Chest wall: No tenderness.  Abdominal:     Palpations: Abdomen is soft.  Musculoskeletal: Normal range of motion.     Right foot: Normal range of motion. No deformity.     Left foot: Normal range of motion. No deformity.  Feet:     Right foot:     Protective Sensation: 10 sites tested. 10 sites sensed.     Skin integrity: Dry skin present. No skin breakdown or erythema.     Toenail Condition: Right toenails are normal.     Left foot:     Protective Sensation: 10 sites tested. 10 sites sensed.     Skin integrity: Dry skin present. No skin breakdown or erythema.     Toenail Condition: Left toenails are normal.  Lymphadenopathy:     Cervical: No cervical adenopathy.  Skin:    General: Skin is warm and dry.     Capillary Refill: Capillary refill takes less than 2 seconds.  Neurological:     Mental Status: He is alert and oriented to person, place, and time. Mental status is at baseline.     Cranial Nerves: No cranial nerve deficit.     Sensory: No sensory deficit.  Psychiatric:        Mood and Affect: Mood normal.        Behavior: Behavior normal.        Thought Content: Thought content normal.        Judgment: Judgment normal.   Assessment/Plan: 1. Type 2 diabetes mellitus with hyperglycemia, unspecified  whether long term insulin use (HCC) - POCT HgB A1C 7.1 today. Continue diabetic medication as prescribed. Refer for diabetic eye exam  - Ambulatory referral to Ophthalmology  2. Atrial fibrillation with RVR (HCC) Stable. Regularly seeing Dr. [name], cardiology for management.   3. Secondary hypercoagulability disorder ([place]) On eliquis due to chronic a-fib  4. Acute or subacute form of ischemic heart disease (HCC) Stable. Continues to see cardiology for management.   5. Abdominal aortic aneurysm without rupture (HCC) Stable. Continues regular visits with vein and vascular for management.   6. Essential hypertension Stable. contnue bp medication as prescribed   7. Need for vaccination against Streptococcus pneumoniae using pneumococcal conjugate vaccine 13 prescrtiption for prevnar 13 sent to pharmacy for administration.  - pneumococcal 13-valent conjugate vaccine (PREVNAR 13) SUSP injection; Inject 0.5 mLs into the muscle once for 1 dose.  Dispense: 0.5 mL; Refill: 0  General Counseling: Xue verbalizes understanding of the findings of todays visit and agrees with plan of treatment. I have discussed any further diagnostic evaluation that may be needed or ordered today. We also reviewed his medications today. he has been encouraged to call the office with any questions or concerns that should arise related to todays visit.  Diabetes Counseling:  1. Addition of ACE inh/ ARB'S for nephroprotection. Microalbumin is updated  2. Diabetic foot care, prevention of complications. Podiatry consult 3. Exercise and lose weight.  4. Diabetic eye examination, Diabetic eye exam is updated  5. Monitor blood sugar closlely. nutrition counseling.  6. Sign and symptoms of hypoglycemia including shaking sweating,confusion and headaches.  This patient was seen  by Leretha Pol FNP Collaboration with Dr Lavera Guise as a part of collaborative care agreement  Orders Placed This Encounter  Procedures   . Ambulatory referral to Ophthalmology  . POCT HgB A1C    Meds ordered this encounter  Medications  . pneumococcal 13-valent conjugate vaccine (PREVNAR 13) SUSP injection    Sig: Inject 0.5 mLs into the muscle once for 1 dose.    Dispense:  0.5 mL    Refill:  0    Order Specific Question:   Supervising Provider    Answer:   Lavera Guise [[id number]]    Time spent: 33 Minutes      Dr Lavera Guise Internal medicine

## 2018-10-29 NOTE — Progress Notes (Signed)
Patient ID: [name], male    DOB: 09-12-1933, 85 y.o.   MRN: [id number]  HPI  [name] is a 85 y/o male with a history of DM, HTN, CKD, atrial fibrillation, previous tobacco use and chronic heart failure.   Echo report from 10/01/2018 reviewed and showed an EF of 40% along with moderate Glenn/TR. Echo report from 08/02/17 reviewed and showed an EF of 35-40% along with mild Glenn.   Was in the ED 08/11/2018 due to dizziness where he was treated and released.   He presents today for a follow-up visit with a chief complaint of minimal shortness of breath upon moderate exertion. He describes this as chronic in nature having been present for several years. He has associated cough and slight weight gain along with this. He denies any difficulty sleeping, dizziness, abdominal distention, palpitations, pedal edema, chest pain or fatigue.   Past Medical History:  Diagnosis Date  . Atrial fibrillation ([place])   . CHF (congestive heart failure) ([place])   . Diabetes ([place])   . Hearing loss   . High blood pressure   . History of bladder problems   . Kidney stones 11/22/2014   Past Surgical History:  Procedure Laterality Date  . CATARACT EXTRACTION    . PROSTATE CANCER    . PROSTATE SURGERY     Family History  Problem Relation Age of Onset  . Colon cancer Mother   . Diabetes Other   . High blood pressure Other   . Prostate cancer Brother   . Diabetes Brother    Social History   Tobacco Use  . Smoking status: Former Smoker    Years: 16.00    Types: Cigarettes  . Smokeless tobacco: Never Used  Substance Use Topics  . Alcohol use: No   No Known Allergies  Prior to Admission medications   Medication Sig Start Date End Date Taking? Authorizing Provider  apixaban (ELIQUIS) 5 MG TABS tablet Take 2.5 mg by mouth 2 (two) times daily. Per [name] change to half a tablet twice daily 12/03/17  Yes [provider]  atorvastatin (LIPITOR) 40 MG tablet Take 1 tablet (40 mg total) by  mouth daily at 6 PM. 06/05/18  Yes Boscia, Greer Ee, NP  carvedilol (COREG) 25 MG tablet Take 25 mg by mouth 2 (two) times daily with a meal.   Yes [provider]  dicyclomine (BENTYL) 10 MG capsule Take 2 capsules (20 mg total) by mouth 3 (three) times daily before meals. 02/24/18  Yes Vanga, Tally Due, MD  furosemide (LASIX) 20 MG tablet Take 1 tablet (20 mg total) by mouth daily. 12/31/17  Yes Alisa Graff, FNP  ONETOUCH VERIO test strip USE UTD TO CHECK BLOOD SUGAR ONCE DX E11.65 02/13/18  Yes Boscia, Heather E, NP  pantoprazole (PROTONIX) 40 MG tablet TAKE 1 TABLET(40 MG) BY MOUTH DAILY 08/25/18  Yes Boscia, Heather E, NP  Semaglutide,0.25 or 0.5MG /DOS, (OZEMPIC, 0.25 OR 0.5 MG/DOSE,) 2 MG/1.5ML SOPN Inject 0.5 mg into the skin once a week. 05/11/18  Yes Ronnell Freshwater, NP    Review of Systems  Constitutional: Negative for appetite change and fatigue.  HENT: Negative for congestion, postnasal drip and sore throat.   Eyes: Negative.   Respiratory: Positive for cough (dry cough) and shortness of breath (with moderate exertion). Negative for chest tightness.   Cardiovascular: Negative for chest pain, palpitations and leg swelling.  Gastrointestinal: Negative for abdominal distention, abdominal pain, anal bleeding and blood in stool.  Endocrine: Negative.   Genitourinary: Negative.   Musculoskeletal: Positive for neck pain. Negative for back pain.  Skin: Negative.   Allergic/Immunologic: Negative.   Neurological: Negative for dizziness and light-headedness.  Hematological: Negative for adenopathy. Does not bruise/bleed easily.  Psychiatric/Behavioral: Negative for dysphoric mood and sleep disturbance. The patient is not nervous/anxious.    Vitals:   10/30/18 1034  BP: 126/85  Pulse: 73  Resp: 18  SpO2: 99%  Weight: 250 lb (113.4 kg)  Height: 6' (1.829 m)   Wt Readings from Last 3 Encounters:  10/30/18 250 lb (113.4 kg)  10/23/18 247 lb (112 kg)  09/21/18 246 lb  (111.6 kg)   Lab Results  Component Value Date   CREATININE 1.95 (H) 08/11/2018   CREATININE 2.04 (H) 04/13/2018   CREATININE 2.40 (H) 04/10/2018     Physical Exam Vitals signs and nursing note reviewed.  Constitutional:      Appearance: He is well-developed.  HENT:     Head: Normocephalic and atraumatic.  Neck:     Musculoskeletal: Normal range of motion and neck supple.     Vascular: No JVD.  Cardiovascular:     Rate and Rhythm: Normal rate. Rhythm irregular.  Pulmonary:     Effort: Pulmonary effort is normal. No respiratory distress.     Breath sounds: No wheezing or rales.  Abdominal:     General: There is no distension.     Palpations: Abdomen is soft.  Musculoskeletal:     Right lower leg: He exhibits no tenderness. Edema (trace pitting) present.     Left lower leg: He exhibits no tenderness. Edema (trace pitting) present.  Skin:    General: Skin is warm and dry.  Neurological:     Mental Status: He is alert and oriented to person, place, and time.  Psychiatric:        Behavior: Behavior normal.    Assessment & Plan:  1: Chronic heart failure with reduced ejection fraction- - NYHA class II - euvolemic today - weighing daily and he was reminded to call for an overnight weight gain of >2 pounds or a weekly weight gain of >5 pounds - weight up 6 pounds from last visit here 2 months ago - not adding salt and his wife has been reading food labels. Reminded to closely follow a 2000mg  sodium diet  - saw cardiology [name]) 10/01/2018 - wearing compression socks daily - drinking ~ 40+ ounces of fluids daily - discussed using entresto since his EF is not >40% and that we would need to monitor renal function closely; brochure provided and he and his wife says that they will think about it - if we start it, would want to get BMP prior and then ~ 2 weeks later - wearing oxygen at 2L at bedtime - BNP 08/11/2018 was 348.0 - has been discharged from paramedicine program;  he can resume care in the future if needed  - has received his flu vaccine for this season  2: HTN- - BP looks good today - saw PCP [name]) 10/23/2018 - BMP 08/11/2018 reviewed and showed sodium 139, potassium 4.1, creatinine 1.95 and GFR 36  3: DM- - glucose at home was 105 - A1c 01/08/18 was 7.4%   Patient did not bring his medications nor a list. Each medication was verbally reviewed with the patient and he was encouraged to bring the bottles to every visit to confirm accuracy of list.  Return in 3 months or sooner for any questions/problems before then.   Marland Kitchen

## 2018-10-30 ENCOUNTER — Ambulatory Visit: Payer: Medicare Other | Attending: Family | Admitting: Family

## 2018-10-30 ENCOUNTER — Other Ambulatory Visit: Payer: Self-pay

## 2018-10-30 ENCOUNTER — Encounter: Payer: Self-pay | Admitting: Family

## 2018-10-30 VITALS — BP 126/85 | HR 73 | Resp 18 | Ht 72.0 in | Wt 250.0 lb

## 2018-10-30 DIAGNOSIS — Z833 Family history of diabetes mellitus: Secondary | ICD-10-CM | POA: Insufficient documentation

## 2018-10-30 DIAGNOSIS — I5022 Chronic systolic (congestive) heart failure: Secondary | ICD-10-CM | POA: Insufficient documentation

## 2018-10-30 DIAGNOSIS — Z8249 Family history of ischemic heart disease and other diseases of the circulatory system: Secondary | ICD-10-CM | POA: Insufficient documentation

## 2018-10-30 DIAGNOSIS — I4891 Unspecified atrial fibrillation: Secondary | ICD-10-CM | POA: Insufficient documentation

## 2018-10-30 DIAGNOSIS — N189 Chronic kidney disease, unspecified: Secondary | ICD-10-CM | POA: Diagnosis not present

## 2018-10-30 DIAGNOSIS — Z7901 Long term (current) use of anticoagulants: Secondary | ICD-10-CM | POA: Insufficient documentation

## 2018-10-30 DIAGNOSIS — E1122 Type 2 diabetes mellitus with diabetic chronic kidney disease: Secondary | ICD-10-CM | POA: Insufficient documentation

## 2018-10-30 DIAGNOSIS — Z87891 Personal history of nicotine dependence: Secondary | ICD-10-CM | POA: Insufficient documentation

## 2018-10-30 DIAGNOSIS — Z79899 Other long term (current) drug therapy: Secondary | ICD-10-CM | POA: Diagnosis not present

## 2018-10-30 DIAGNOSIS — I10 Essential (primary) hypertension: Secondary | ICD-10-CM

## 2018-10-30 DIAGNOSIS — I130 Hypertensive heart and chronic kidney disease with heart failure and stage 1 through stage 4 chronic kidney disease, or unspecified chronic kidney disease: Secondary | ICD-10-CM | POA: Insufficient documentation

## 2018-10-30 DIAGNOSIS — N1832 Chronic kidney disease, stage 3b: Secondary | ICD-10-CM

## 2018-10-30 NOTE — Patient Instructions (Signed)
Continue weighing daily and call for an overnight weight gain of > 2 pounds or a weekly weight gain of >5 pounds. 

## 2018-11-04 DIAGNOSIS — E119 Type 2 diabetes mellitus without complications: Secondary | ICD-10-CM | POA: Diagnosis not present

## 2018-11-04 DIAGNOSIS — H26493 Other secondary cataract, bilateral: Secondary | ICD-10-CM | POA: Diagnosis not present

## 2018-11-12 ENCOUNTER — Ambulatory Visit: Payer: Medicare Other | Admitting: Podiatry

## 2018-11-16 ENCOUNTER — Other Ambulatory Visit: Payer: Self-pay

## 2018-11-16 MED ORDER — ATORVASTATIN CALCIUM 40 MG PO TABS: 40.0000 mg | ORAL_TABLET | Freq: Every day | ORAL | 5 refills | Status: DC

## 2018-11-20 DIAGNOSIS — G4733 Obstructive sleep apnea (adult) (pediatric): Secondary | ICD-10-CM | POA: Diagnosis not present

## 2018-12-07 ENCOUNTER — Telehealth: Payer: Self-pay | Admitting: Nurse Practitioner

## 2018-12-07 NOTE — Telephone Encounter (Signed)
Good.

## 2018-12-10 ENCOUNTER — Other Ambulatory Visit: Payer: Self-pay

## 2018-12-10 ENCOUNTER — Encounter: Payer: Self-pay | Admitting: Emergency Medicine

## 2018-12-10 ENCOUNTER — Emergency Department: Payer: Medicare Other

## 2018-12-10 ENCOUNTER — Inpatient Hospital Stay
Admission: EM | Admit: 2018-12-10 | Discharge: 2018-12-12 | DRG: 291 | Disposition: A | Discharge status: Home / Self Care | Payer: Medicare Other | Attending: Internal Medicine | Admitting: Internal Medicine

## 2018-12-10 DIAGNOSIS — Z86010 Personal history of colonic polyps: Secondary | ICD-10-CM

## 2018-12-10 DIAGNOSIS — Z9981 Dependence on supplemental oxygen: Secondary | ICD-10-CM | POA: Diagnosis not present

## 2018-12-10 DIAGNOSIS — I482 Chronic atrial fibrillation, unspecified: Secondary | ICD-10-CM

## 2018-12-10 DIAGNOSIS — I160 Hypertensive urgency: Secondary | ICD-10-CM | POA: Diagnosis not present

## 2018-12-10 DIAGNOSIS — I130 Hypertensive heart and chronic kidney disease with heart failure and stage 1 through stage 4 chronic kidney disease, or unspecified chronic kidney disease: Secondary | ICD-10-CM | POA: Diagnosis not present

## 2018-12-10 DIAGNOSIS — Z8546 Personal history of malignant neoplasm of prostate: Secondary | ICD-10-CM

## 2018-12-10 DIAGNOSIS — R0602 Shortness of breath: Secondary | ICD-10-CM | POA: Diagnosis not present

## 2018-12-10 DIAGNOSIS — I4821 Permanent atrial fibrillation: Secondary | ICD-10-CM | POA: Diagnosis not present

## 2018-12-10 DIAGNOSIS — R9431 Abnormal electrocardiogram [ECG] [EKG]: Secondary | ICD-10-CM | POA: Diagnosis not present

## 2018-12-10 DIAGNOSIS — Z79899 Other long term (current) drug therapy: Secondary | ICD-10-CM

## 2018-12-10 DIAGNOSIS — R05 Cough: Secondary | ICD-10-CM | POA: Diagnosis not present

## 2018-12-10 DIAGNOSIS — I5043 Acute on chronic combined systolic (congestive) and diastolic (congestive) heart failure: Secondary | ICD-10-CM | POA: Diagnosis present

## 2018-12-10 DIAGNOSIS — N183 Chronic kidney disease, stage 3 unspecified: Secondary | ICD-10-CM | POA: Diagnosis not present

## 2018-12-10 DIAGNOSIS — H9190 Unspecified hearing loss, unspecified ear: Secondary | ICD-10-CM | POA: Diagnosis present

## 2018-12-10 DIAGNOSIS — Z7901 Long term (current) use of anticoagulants: Secondary | ICD-10-CM | POA: Diagnosis not present

## 2018-12-10 DIAGNOSIS — E119 Type 2 diabetes mellitus without complications: Secondary | ICD-10-CM | POA: Diagnosis not present

## 2018-12-10 DIAGNOSIS — E785 Hyperlipidemia, unspecified: Secondary | ICD-10-CM | POA: Diagnosis present

## 2018-12-10 DIAGNOSIS — Z8249 Family history of ischemic heart disease and other diseases of the circulatory system: Secondary | ICD-10-CM

## 2018-12-10 DIAGNOSIS — Z7984 Long term (current) use of oral hypoglycemic drugs: Secondary | ICD-10-CM

## 2018-12-10 DIAGNOSIS — G4733 Obstructive sleep apnea (adult) (pediatric): Secondary | ICD-10-CM | POA: Diagnosis present

## 2018-12-10 DIAGNOSIS — E1122 Type 2 diabetes mellitus with diabetic chronic kidney disease: Secondary | ICD-10-CM | POA: Diagnosis not present

## 2018-12-10 DIAGNOSIS — I110 Hypertensive heart disease with heart failure: Secondary | ICD-10-CM | POA: Diagnosis not present

## 2018-12-10 DIAGNOSIS — J209 Acute bronchitis, unspecified: Secondary | ICD-10-CM | POA: Diagnosis present

## 2018-12-10 DIAGNOSIS — I714 Abdominal aortic aneurysm, without rupture: Secondary | ICD-10-CM | POA: Diagnosis not present

## 2018-12-10 DIAGNOSIS — I509 Heart failure, unspecified: Secondary | ICD-10-CM | POA: Diagnosis not present

## 2018-12-10 DIAGNOSIS — Z8042 Family history of malignant neoplasm of prostate: Secondary | ICD-10-CM

## 2018-12-10 DIAGNOSIS — J440 Chronic obstructive pulmonary disease with acute lower respiratory infection: Secondary | ICD-10-CM | POA: Diagnosis present

## 2018-12-10 DIAGNOSIS — J9621 Acute and chronic respiratory failure with hypoxia: Secondary | ICD-10-CM | POA: Diagnosis present

## 2018-12-10 DIAGNOSIS — Z20828 Contact with and (suspected) exposure to other viral communicable diseases: Secondary | ICD-10-CM | POA: Diagnosis present

## 2018-12-10 DIAGNOSIS — Z87891 Personal history of nicotine dependence: Secondary | ICD-10-CM

## 2018-12-10 DIAGNOSIS — Z833 Family history of diabetes mellitus: Secondary | ICD-10-CM

## 2018-12-10 DIAGNOSIS — N1832 Chronic kidney disease, stage 3b: Secondary | ICD-10-CM | POA: Diagnosis not present

## 2018-12-10 DIAGNOSIS — J449 Chronic obstructive pulmonary disease, unspecified: Secondary | ICD-10-CM

## 2018-12-10 DIAGNOSIS — I5033 Acute on chronic diastolic (congestive) heart failure: Secondary | ICD-10-CM | POA: Diagnosis not present

## 2018-12-10 DIAGNOSIS — Z87442 Personal history of urinary calculi: Secondary | ICD-10-CM | POA: Diagnosis not present

## 2018-12-10 DIAGNOSIS — I248 Other forms of acute ischemic heart disease: Secondary | ICD-10-CM | POA: Diagnosis not present

## 2018-12-10 DIAGNOSIS — Z7952 Long term (current) use of systemic steroids: Secondary | ICD-10-CM

## 2018-12-10 DIAGNOSIS — I5023 Acute on chronic systolic (congestive) heart failure: Secondary | ICD-10-CM | POA: Diagnosis not present

## 2018-12-10 DIAGNOSIS — I5032 Chronic diastolic (congestive) heart failure: Secondary | ICD-10-CM | POA: Diagnosis not present

## 2018-12-10 DIAGNOSIS — R079 Chest pain, unspecified: Secondary | ICD-10-CM | POA: Diagnosis not present

## 2018-12-10 LAB — BASIC METABOLIC PANEL
Anion gap: 9 (ref 5–15)
BUN: 39 mg/dL — ABNORMAL HIGH (ref 8–23)
CO2: 23 mmol/L (ref 22–32)
Calcium: 9.4 mg/dL (ref 8.9–10.3)
Chloride: 108 mmol/L (ref 98–111)
Creatinine, Ser: 2.22 mg/dL — ABNORMAL HIGH (ref 0.61–1.24)
GFR calc Af Amer: 31 mL/min — ABNORMAL LOW (ref >60)
GFR calc non Af Amer: 26 mL/min — ABNORMAL LOW (ref >60)
Glucose, Bld: 104 mg/dL — ABNORMAL HIGH (ref 70–99)
Potassium: 4.1 mmol/L (ref 3.5–5.1)
Sodium: 140 mmol/L (ref 135–145)

## 2018-12-10 LAB — PROTIME-INR
INR: 1.1 (ref 0.8–1.2)
Prothrombin Time: 14.3 seconds (ref 11.4–15.2)

## 2018-12-10 LAB — TROPONIN I (HIGH SENSITIVITY)
Troponin I (High Sensitivity): 18 ng/L — ABNORMAL HIGH (ref <18)
Troponin I (High Sensitivity): 18 ng/L — ABNORMAL HIGH (ref <18)

## 2018-12-10 LAB — CBC
HCT: 43 % (ref 39.0–52.0)
Hemoglobin: 13.6 g/dL (ref 13.0–17.0)
MCH: 28.9 pg (ref 26.0–34.0)
MCHC: 31.6 g/dL (ref 30.0–36.0)
MCV: 91.3 fL (ref 80.0–100.0)
Platelets: 149 10*3/uL — ABNORMAL LOW (ref 150–400)
RBC: 4.71 MIL/uL (ref 4.22–5.81)
RDW: 15.3 % (ref 11.5–15.5)
WBC: 5.8 10*3/uL (ref 4.0–10.5)
nRBC: 0 % (ref 0.0–0.2)

## 2018-12-10 LAB — APTT: aPTT: 30 seconds (ref 24–36)

## 2018-12-10 MED ORDER — ALBUTEROL SULFATE (2.5 MG/3ML) 0.083% IN NEBU
2.5000 mg | INHALATION_SOLUTION | Freq: Once | RESPIRATORY_TRACT | Status: AC
  Administered 2018-12-10: 2.5 mg via RESPIRATORY_TRACT
  Filled 2018-12-10: qty 3

## 2018-12-10 MED ORDER — SODIUM CHLORIDE 0.9% FLUSH
3.0000 mL | Freq: Once | INTRAVENOUS | Status: AC
  Administered 2018-12-10: 3 mL via INTRAVENOUS

## 2018-12-10 MED ORDER — SODIUM CHLORIDE 0.9 % IV BOLUS
500.0000 mL | Freq: Once | INTRAVENOUS | Status: AC
  Administered 2018-12-10: 500 mL via INTRAVENOUS

## 2018-12-10 MED ORDER — ALBUTEROL SULFATE HFA 108 (90 BASE) MCG/ACT IN AERS: 2.0000 | INHALATION_SPRAY | RESPIRATORY_TRACT | 0 refills | Status: DC | PRN

## 2018-12-10 MED ORDER — METHYLPREDNISOLONE SODIUM SUCC 125 MG IJ SOLR
125.0000 mg | Freq: Once | INTRAMUSCULAR | Status: AC
  Administered 2018-12-10: 125 mg via INTRAVENOUS
  Filled 2018-12-10: qty 2

## 2018-12-10 MED ORDER — PREDNISONE 20 MG PO TABS: 60.0000 mg | ORAL_TABLET | Freq: Every day | ORAL | 0 refills | Status: DC

## 2018-12-10 MED ORDER — AZITHROMYCIN 250 MG PO TABS: ORAL_TABLET | ORAL | 0 refills | Status: DC

## 2018-12-10 MED ORDER — MORPHINE SULFATE (PF) 4 MG/ML IV SOLN
4.0000 mg | Freq: Once | INTRAVENOUS | Status: AC
  Administered 2018-12-10: 4 mg via INTRAVENOUS
  Filled 2018-12-10: qty 1

## 2018-12-10 NOTE — ED Provider Notes (Signed)
[facility] Emergency Department Provider Note ____________________________________________   First MD Initiated Contact with Patient 12/10/18 2042     (approximate)  I have reviewed the triage vital signs and the nursing notes.   HISTORY  Chief Complaint No chief complaint on file.    HPI [name] is a 85 y.o. male with PMH as noted below who presents with cough, acute onset this morning, nonproductive, and associated with left-sided chest pain which is sharp and started with the cough.  He also has rhinorrhea and shortness of breath.  The patient denies fever and has no nausea or vomiting.  Past Medical History:  Diagnosis Date  . Atrial fibrillation ([place])   . CHF (congestive heart failure) ([place])   . Diabetes ([place])   . Hearing loss   . High blood pressure   . History of bladder problems   . Kidney stones 11/22/2014    Patient Active Problem List   Diagnosis Date Noted  . Type 2 diabetes mellitus with hyperglycemia ([place]) 10/23/2018  . Secondary hypercoagulability disorder ([place]) 10/23/2018  . Acute or subacute form of ischemic heart disease ([place]) 10/23/2018  . Need for vaccination against Streptococcus pneumoniae using pneumococcal conjugate vaccine 13 10/23/2018  . Chronic mesenteric ischemia ([place]) 07/28/2018  . Abdominal aortic aneurysm without rupture ([place]) 07/28/2018  . Primary osteoarthritis of left hip 02/12/2018  . Bilateral hearing loss due to cerumen impaction 10/06/2017  . Chronic systolic heart failure ([place]) 08/19/2017  . Atrial fibrillation with RVR ([place]) 08/02/2017  . Acute CHF (congestive heart failure) ([place]) 08/02/2017  . Prostatic hypertrophy 07/20/2017  . Dysuria 07/20/2017  . Hyperpigmentation 04/10/2017  . Uncontrolled type 2 diabetes mellitus with hyperglycemia ([place]) 03/24/2017  . Encounter for general adult medical examination with abnormal findings 03/24/2017  . Chest pain 05/04/2016  . Elevated troponin  05/04/2016  . Essential hypertension 07/06/2014  . Cough 03/21/2014  . OSA (obstructive sleep apnea) 03/21/2014  . Paroxysmal A-fib ([place]) 03/21/2014  . Hx of adenomatous colonic polyps 06/28/2013  . Liver cyst 06/28/2013  . Chronic kidney disease 06/08/2013  . Type 2 diabetes mellitus with stage 3 chronic kidney disease, without long-term current use of insulin ([place]) 06/08/2013  . Hyperlipidemia, mixed 06/08/2013  . Valvular heart disease 06/08/2013    Past Surgical History:  Procedure Laterality Date  . CATARACT EXTRACTION    . PROSTATE CANCER    . PROSTATE SURGERY      Prior to Admission medications   Medication Sig Start Date End Date Taking? Authorizing Provider  apixaban (ELIQUIS) 5 MG TABS tablet Take 2.5 mg by mouth 2 (two) times daily. Per [name] change to half a tablet twice daily 12/03/17   [provider]  atorvastatin (LIPITOR) 40 MG tablet Take 1 tablet (40 mg total) by mouth daily at 6 PM. 11/16/18   Boscia, Greer Ee, NP  carvedilol (COREG) 25 MG tablet Take 25 mg by mouth 2 (two) times daily with a meal.    [provider]  dicyclomine (BENTYL) 10 MG capsule Take 2 capsules (20 mg total) by mouth 3 (three) times daily before meals. 02/24/18   Lin Landsman, MD  furosemide (LASIX) 20 MG tablet Take 1 tablet (20 mg total) by mouth daily. 12/31/17   Alisa Graff, FNP  ONETOUCH VERIO test strip USE UTD TO CHECK BLOOD SUGAR ONCE DX E11.65 02/13/18   Ronnell Freshwater, NP  pantoprazole (PROTONIX) 40 MG tablet TAKE 1 TABLET(40 MG) BY MOUTH DAILY 08/25/18  Ronnell Freshwater, NP  Semaglutide,0.25 or 0.5MG /DOS, (OZEMPIC, 0.25 OR 0.5 MG/DOSE,) 2 MG/1.5ML SOPN Inject 0.5 mg into the skin once a week. 05/11/18   Ronnell Freshwater, NP    Allergies Patient has no known allergies.  Family History  Problem Relation Age of Onset  . Colon cancer Mother   . Diabetes Other   . High blood pressure Other   . Prostate cancer Brother   . Diabetes Brother      Social History Social History   Tobacco Use  . Smoking status: Former Smoker    Years: 16.00    Types: Cigarettes  . Smokeless tobacco: Never Used  Substance Use Topics  . Alcohol use: No  . Drug use: No    Review of Systems  Constitutional: No fever. Eyes: No redness. ENT: No sore throat. Cardiovascular: Positive chest pain. Respiratory: Positive for shortness of breath. Gastrointestinal: No vomiting or diarrhea.  Genitourinary: Negative for dysuria.  Musculoskeletal: Negative for back pain. Skin: Negative for rash. Neurological: Negative for headache.   ____________________________________________   PHYSICAL EXAM:  VITAL SIGNS: ED Triage Vitals  Enc Vitals Group     BP 12/10/18 1810 (!) 139/101     Pulse Rate 12/10/18 1810 (!) 129     Resp 12/10/18 1810 16     Temp 12/10/18 1810 98.1 F (36.7 C)     Temp Source 12/10/18 1810 Oral     SpO2 12/10/18 1810 96 %     Weight 12/10/18 1807 250 lb (113.4 kg)     Height 12/10/18 1807 6' (1.829 m)     Head Circumference --      Peak Flow --      Pain Score 12/10/18 1807 0     Pain Loc --      Pain Edu? --      Excl. in [place]? --     Constitutional: Alert and oriented.  Uncomfortable appearing but in no acute distress. Eyes: Conjunctivae are normal.  Head: Atraumatic. Nose: No congestion/rhinnorhea. Mouth/Throat: Mucous membranes are moist.   Neck: Normal range of motion.  Cardiovascular: Normal rate, regular rhythm. Grossly normal heart sounds.  Good peripheral circulation. Respiratory: Normal respiratory effort.  No retractions.  Bilateral wheezing. Gastrointestinal: Soft and nontender. No distention.  Genitourinary: No flank tenderness. Musculoskeletal: Trace bilateral lower extremity edema.  Extremities warm and well perfused.  Neurologic:  Normal speech and language. No gross focal neurologic deficits are appreciated.  Skin:  Skin is warm and dry. No rash noted. Psychiatric: Mood and affect are normal.  Speech and behavior are normal.  ____________________________________________   LABS (all labs ordered are listed, but only abnormal results are displayed)  Labs Reviewed  BASIC METABOLIC PANEL - Abnormal; Notable for the following components:      Result Value   Glucose, Bld 104 (*)    BUN 39 (*)    Creatinine, Ser 2.22 (*)    GFR calc non Af Amer 26 (*)    GFR calc Af Amer 31 (*)    All other components within normal limits  CBC - Abnormal; Notable for the following components:   Platelets 149 (*)    All other components within normal limits  TROPONIN I (HIGH SENSITIVITY) - Abnormal; Notable for the following components:   Troponin I (High Sensitivity) 18 (*)    All other components within normal limits  TROPONIN I (HIGH SENSITIVITY) - Abnormal; Notable for the following components:   Troponin I (High Sensitivity) 18 (*)  All other components within normal limits  PROTIME-INR  APTT   ____________________________________________  EKG  ED ECG REPORT I, Arta Silence, the attending physician, personally viewed and interpreted this ECG.  Date: 12/10/2018 EKG Time: 1808 Rate: 110 Rhythm: Atrial fibrillation QRS Axis: Left axis Intervals: RBBB ST/T Wave abnormalities: Nonspecific ST abnormalities Narrative Interpretation: Atrial fibrillation with no evidence of acute ischemia; no significant change when compared to EKG of 04/12/2018  ____________________________________________  RADIOLOGY  CXR: No focal infiltrate or other acute abnormality  ____________________________________________   PROCEDURES  Procedure(s) performed: No  Procedures  Critical Care performed: No ____________________________________________   INITIAL IMPRESSION / ASSESSMENT AND PLAN / ED COURSE  Pertinent labs & imaging results that were available during my care of the patient were reviewed by me and considered in my medical decision making (see chart for details).  85 year old  male with PMH as noted above including atrial fibrillation, CHF, and diabetes presents with acute onset of cough, rhinorrhea, shortness of breath, and chest pain since this morning.  I reviewed the past medical records in [facility].  The patient's most recent ED visit was in July for dizziness.  He had an echocardiogram last year showing moderately decreased LVEF.  On exam today, the patient is relatively comfortable appearing, although he intermittently has bouts of coughing in which he briefly appears short of breath.  O2 saturation is in the high 90s on room air, and in between coughing fits, he has no increased work of breathing or respiratory distress.  He is slightly tachycardic and in atrial fibrillation.  He has wheezing bilaterally.  Chest x-ray shows no focal infiltrate or evidence of edema.  Initial lab work-up from triage is reassuring.  The patient's creatinine is elevated although not significantly changed from his baseline.  Troponin also appears to be consistent with the patient's baseline from prior visits.  Overall presentation is most consistent with acute bronchitis.  Differential also includes COVID-19 although the patient is afebrile and has no infiltrates on his x-ray.  I have a lower suspicion for cardiac etiology.  At this time the patient does not appear to have any pulmonary edema or fluid overload.   We will give bronchodilators, steroid, a small fluid bolus, and reassess.   ----------------------------------------- 11:40 PM on 12/10/2018 -----------------------------------------  The patient appears much more comfortable on reassessment.  His tachycardia has resolved.  He no longer has any wheezing on the lung exam, and continues to have O2 saturation in the mid to high 90s on room air.  Repeat troponin showed no increase.  Overall presentation is still consistent with acute bronchitis.  At this time, there is no indication for admission.  However, after discussion with the  patient and his wife, because of how short of breath he was, they would prefer to have him observed for a bit longer to make sure that his symptoms do not immediately return.  Therefore, we will observe him for a few more hours, with a plan to discharge if he continues to appear comfortable and has no hypoxia or significant tachycardia.  The patient is comfortable with this plan.  I signed the patient out to the oncoming physician Dr. Beather Arbour.  _____________________________  [name] was evaluated in Emergency Department on 12/10/2018 for the symptoms described in the history of present illness. He was evaluated in the context of the global COVID-19 pandemic, which necessitated consideration that the patient might be at risk for infection with the SARS-CoV-2 virus that causes COVID-19. Institutional protocols  and algorithms that pertain to the evaluation of patients at risk for COVID-19 are in a state of rapid change based on information released by regulatory bodies including the CDC and federal and state organizations. These policies and algorithms were followed during the patient's care in the ED. ____________________________________________   FINAL CLINICAL IMPRESSION(S) / ED DIAGNOSES  Final diagnoses:  None      NEW MEDICATIONS STARTED DURING THIS VISIT:  New Prescriptions   No medications on file     Note:  This document was prepared using Dragon voice recognition software and may include unintentional dictation errors.    Arta Silence, MD 12/10/18 [phone]

## 2018-12-10 NOTE — ED Triage Notes (Signed)
C/O cough, runny nose, chest pain with coughing, and SOB since 1600.  Patient is AAOx3.  Skin warm and dry. NAD. No SOB/ DOE.

## 2018-12-11 ENCOUNTER — Other Ambulatory Visit: Payer: Self-pay

## 2018-12-11 ENCOUNTER — Inpatient Hospital Stay
Admit: 2018-12-11 | Discharge: 2018-12-11 | Disposition: A | Discharge status: Home / Self Care | Payer: Medicare Other | Attending: Family Medicine | Admitting: Family Medicine

## 2018-12-11 DIAGNOSIS — Z87891 Personal history of nicotine dependence: Secondary | ICD-10-CM | POA: Diagnosis not present

## 2018-12-11 DIAGNOSIS — J440 Chronic obstructive pulmonary disease with acute lower respiratory infection: Secondary | ICD-10-CM | POA: Diagnosis present

## 2018-12-11 DIAGNOSIS — E785 Hyperlipidemia, unspecified: Secondary | ICD-10-CM | POA: Diagnosis present

## 2018-12-11 DIAGNOSIS — I5023 Acute on chronic systolic (congestive) heart failure: Secondary | ICD-10-CM | POA: Diagnosis not present

## 2018-12-11 DIAGNOSIS — Z7901 Long term (current) use of anticoagulants: Secondary | ICD-10-CM | POA: Diagnosis not present

## 2018-12-11 DIAGNOSIS — I714 Abdominal aortic aneurysm, without rupture: Secondary | ICD-10-CM | POA: Diagnosis present

## 2018-12-11 DIAGNOSIS — E119 Type 2 diabetes mellitus without complications: Secondary | ICD-10-CM | POA: Diagnosis not present

## 2018-12-11 DIAGNOSIS — I130 Hypertensive heart and chronic kidney disease with heart failure and stage 1 through stage 4 chronic kidney disease, or unspecified chronic kidney disease: Secondary | ICD-10-CM | POA: Diagnosis present

## 2018-12-11 DIAGNOSIS — Z7984 Long term (current) use of oral hypoglycemic drugs: Secondary | ICD-10-CM | POA: Diagnosis not present

## 2018-12-11 DIAGNOSIS — Z86010 Personal history of colonic polyps: Secondary | ICD-10-CM | POA: Diagnosis not present

## 2018-12-11 DIAGNOSIS — I482 Chronic atrial fibrillation, unspecified: Secondary | ICD-10-CM | POA: Diagnosis not present

## 2018-12-11 DIAGNOSIS — I509 Heart failure, unspecified: Secondary | ICD-10-CM | POA: Diagnosis not present

## 2018-12-11 DIAGNOSIS — Z8249 Family history of ischemic heart disease and other diseases of the circulatory system: Secondary | ICD-10-CM | POA: Diagnosis not present

## 2018-12-11 DIAGNOSIS — I5043 Acute on chronic combined systolic (congestive) and diastolic (congestive) heart failure: Secondary | ICD-10-CM | POA: Diagnosis present

## 2018-12-11 DIAGNOSIS — J209 Acute bronchitis, unspecified: Secondary | ICD-10-CM | POA: Diagnosis present

## 2018-12-11 DIAGNOSIS — N1832 Chronic kidney disease, stage 3b: Secondary | ICD-10-CM | POA: Diagnosis present

## 2018-12-11 DIAGNOSIS — I248 Other forms of acute ischemic heart disease: Secondary | ICD-10-CM | POA: Diagnosis present

## 2018-12-11 DIAGNOSIS — H9190 Unspecified hearing loss, unspecified ear: Secondary | ICD-10-CM | POA: Diagnosis present

## 2018-12-11 DIAGNOSIS — I160 Hypertensive urgency: Secondary | ICD-10-CM | POA: Diagnosis present

## 2018-12-11 DIAGNOSIS — Z20828 Contact with and (suspected) exposure to other viral communicable diseases: Secondary | ICD-10-CM | POA: Diagnosis present

## 2018-12-11 DIAGNOSIS — J449 Chronic obstructive pulmonary disease, unspecified: Secondary | ICD-10-CM

## 2018-12-11 DIAGNOSIS — Z9981 Dependence on supplemental oxygen: Secondary | ICD-10-CM | POA: Diagnosis not present

## 2018-12-11 DIAGNOSIS — Z833 Family history of diabetes mellitus: Secondary | ICD-10-CM | POA: Diagnosis not present

## 2018-12-11 DIAGNOSIS — J9621 Acute and chronic respiratory failure with hypoxia: Secondary | ICD-10-CM | POA: Diagnosis present

## 2018-12-11 DIAGNOSIS — Z79899 Other long term (current) drug therapy: Secondary | ICD-10-CM | POA: Diagnosis not present

## 2018-12-11 DIAGNOSIS — Z87442 Personal history of urinary calculi: Secondary | ICD-10-CM | POA: Diagnosis not present

## 2018-12-11 DIAGNOSIS — E1122 Type 2 diabetes mellitus with diabetic chronic kidney disease: Secondary | ICD-10-CM | POA: Diagnosis present

## 2018-12-11 DIAGNOSIS — G4733 Obstructive sleep apnea (adult) (pediatric): Secondary | ICD-10-CM | POA: Diagnosis present

## 2018-12-11 DIAGNOSIS — I4821 Permanent atrial fibrillation: Secondary | ICD-10-CM | POA: Diagnosis present

## 2018-12-11 DIAGNOSIS — N183 Chronic kidney disease, stage 3 unspecified: Secondary | ICD-10-CM | POA: Diagnosis not present

## 2018-12-11 LAB — HEMOGLOBIN A1C
Hgb A1c MFr Bld: 7.5 % — ABNORMAL HIGH (ref 4.8–5.6)
Mean Plasma Glucose: 168.55 mg/dL

## 2018-12-11 LAB — SARS CORONAVIRUS 2 (TAT 6-24 HRS): SARS Coronavirus 2: NEGATIVE

## 2018-12-11 LAB — BRAIN NATRIURETIC PEPTIDE: B Natriuretic Peptide: 430 pg/mL — ABNORMAL HIGH (ref 0.0–100.0)

## 2018-12-11 LAB — TSH: TSH: 0.751 u[IU]/mL (ref 0.350–4.500)

## 2018-12-11 LAB — GLUCOSE, CAPILLARY
Glucose-Capillary: 229 mg/dL — ABNORMAL HIGH (ref 70–99)
Glucose-Capillary: 176 mg/dL — ABNORMAL HIGH (ref 70–99)
Glucose-Capillary: 173 mg/dL — ABNORMAL HIGH (ref 70–99)
Glucose-Capillary: 211 mg/dL — ABNORMAL HIGH (ref 70–99)

## 2018-12-11 LAB — MAGNESIUM: Magnesium: 1.9 mg/dL (ref 1.7–2.4)

## 2018-12-11 LAB — POC SARS CORONAVIRUS 2 AG: SARS Coronavirus 2 Ag: NEGATIVE

## 2018-12-11 LAB — TROPONIN I (HIGH SENSITIVITY)
Troponin I (High Sensitivity): 14 ng/L (ref <18)
Troponin I (High Sensitivity): 15 ng/L (ref <18)

## 2018-12-11 MED ORDER — SEMAGLUTIDE(0.25 OR 0.5MG/DOS) 2 MG/1.5ML ~~LOC~~ SOPN: 0.5000 mg | PEN_INJECTOR | SUBCUTANEOUS | Status: DC

## 2018-12-11 MED ORDER — PANTOPRAZOLE SODIUM 40 MG PO TBEC
40.0000 mg | DELAYED_RELEASE_TABLET | Freq: Every day | ORAL | Status: DC
  Administered 2018-12-11 – 2018-12-12 (×2): 40 mg via ORAL
  Filled 2018-12-11 (×2): qty 1

## 2018-12-11 MED ORDER — HYDRALAZINE HCL 25 MG PO TABS
25.0000 mg | ORAL_TABLET | Freq: Two times a day (BID) | ORAL | Status: DC
  Administered 2018-12-11 – 2018-12-12 (×3): 25 mg via ORAL
  Filled 2018-12-11 (×3): qty 1

## 2018-12-11 MED ORDER — ZOLPIDEM TARTRATE 5 MG PO TABS: 5.0000 mg | ORAL_TABLET | Freq: Every evening | ORAL | Status: DC | PRN

## 2018-12-11 MED ORDER — CARVEDILOL 25 MG PO TABS
25.0000 mg | ORAL_TABLET | Freq: Two times a day (BID) | ORAL | Status: DC
  Administered 2018-12-11 – 2018-12-12 (×3): 25 mg via ORAL
  Filled 2018-12-11 (×3): qty 1

## 2018-12-11 MED ORDER — SODIUM CHLORIDE 0.9% FLUSH
3.0000 mL | Freq: Two times a day (BID) | INTRAVENOUS | Status: DC
  Administered 2018-12-11 – 2018-12-12 (×3): 3 mL via INTRAVENOUS

## 2018-12-11 MED ORDER — APIXABAN 2.5 MG PO TABS
2.5000 mg | ORAL_TABLET | Freq: Two times a day (BID) | ORAL | Status: DC
  Administered 2018-12-11 – 2018-12-12 (×3): 2.5 mg via ORAL
  Filled 2018-12-11 (×4): qty 1

## 2018-12-11 MED ORDER — IPRATROPIUM-ALBUTEROL 0.5-2.5 (3) MG/3ML IN SOLN
3.0000 mL | Freq: Once | RESPIRATORY_TRACT | Status: AC
  Administered 2018-12-11: 3 mL via RESPIRATORY_TRACT
  Filled 2018-12-11: qty 3

## 2018-12-11 MED ORDER — SODIUM CHLORIDE 0.9% FLUSH: 3.0000 mL | INTRAVENOUS | Status: DC | PRN

## 2018-12-11 MED ORDER — ACETAMINOPHEN 325 MG PO TABS: 650.0000 mg | ORAL_TABLET | ORAL | Status: DC | PRN

## 2018-12-11 MED ORDER — FUROSEMIDE 10 MG/ML IJ SOLN
40.0000 mg | Freq: Two times a day (BID) | INTRAMUSCULAR | Status: DC
  Administered 2018-12-11 – 2018-12-12 (×3): 40 mg via INTRAVENOUS
  Filled 2018-12-11 (×3): qty 4

## 2018-12-11 MED ORDER — LABETALOL HCL 5 MG/ML IV SOLN: 20.0000 mg | INTRAVENOUS | Status: DC | PRN

## 2018-12-11 MED ORDER — SODIUM CHLORIDE 0.9 % IV SOLN: 250.0000 mL | INTRAVENOUS | Status: DC | PRN

## 2018-12-11 MED ORDER — CARVEDILOL 25 MG PO TABS: 25.0000 mg | ORAL_TABLET | Freq: Two times a day (BID) | ORAL | Status: DC

## 2018-12-11 MED ORDER — ATORVASTATIN CALCIUM 20 MG PO TABS
40.0000 mg | ORAL_TABLET | Freq: Every day | ORAL | Status: DC
  Administered 2018-12-11: 40 mg via ORAL
  Filled 2018-12-11: qty 2

## 2018-12-11 MED ORDER — ONDANSETRON HCL 4 MG/2ML IJ SOLN: 4.0000 mg | Freq: Four times a day (QID) | INTRAMUSCULAR | Status: DC | PRN

## 2018-12-11 MED ORDER — INSULIN ASPART 100 UNIT/ML ~~LOC~~ SOLN
0.0000 [IU] | Freq: Three times a day (TID) | SUBCUTANEOUS | Status: DC
  Administered 2018-12-11: 3 [IU] via SUBCUTANEOUS
  Administered 2018-12-11 – 2018-12-12 (×3): 2 [IU] via SUBCUTANEOUS
  Filled 2018-12-11 (×4): qty 1

## 2018-12-11 MED ORDER — ALPRAZOLAM 0.25 MG PO TABS: 0.2500 mg | ORAL_TABLET | Freq: Two times a day (BID) | ORAL | Status: DC | PRN

## 2018-12-11 MED ORDER — IPRATROPIUM-ALBUTEROL 0.5-2.5 (3) MG/3ML IN SOLN: 3.0000 mL | RESPIRATORY_TRACT | Status: DC | PRN

## 2018-12-11 NOTE — Progress Notes (Signed)
Pt arrived to room 253.  Pt settled and oriented to safety measures.  Call light and phone within reach; bed low and brakes locked. Placed on cardiac monitor: a-fib noted.

## 2018-12-11 NOTE — Progress Notes (Signed)
[place] [facility] [place] NAME: [name]    MR#:  [id number]  DATE OF BIRTH:  1933-03-09  SUBJECTIVE:   Patient seen in the emergency room. He is having good urine output. Feels better than yesterday. Wife in the ER.  Patient has had some salty food lately along with increasing intake of [place] mist and ginger ale REVIEW OF SYSTEMS:   Review of Systems  Constitutional: Negative for chills, fever and weight loss.  HENT: Negative for ear discharge, ear pain and nosebleeds.   Eyes: Negative for blurred vision, pain and discharge.  Respiratory: Positive for shortness of breath. Negative for sputum production, wheezing and stridor.   Cardiovascular: Positive for orthopnea, leg swelling and PND. Negative for chest pain and palpitations.  Gastrointestinal: Negative for abdominal pain, diarrhea, nausea and vomiting.  Genitourinary: Negative for frequency and urgency.  Musculoskeletal: Negative for back pain and joint pain.  Neurological: Negative for sensory change, speech change, focal weakness and weakness.  Psychiatric/Behavioral: Negative for depression and hallucinations. The patient is not nervous/anxious.    Tolerating Diet:yes Tolerating PT: patient is ambulatory  DRUG ALLERGIES:  No Known Allergies  VITALS:  Blood pressure (!) 139/92, pulse (!) 44, temperature 98.1 F (36.7 C), temperature source Oral, resp. rate 16, height 6' (1.829 m), weight 113.4 kg, SpO2 98 %.  PHYSICAL EXAMINATION:   Physical Exam  GENERAL:  85 y.o.-year-old patient lying in the bed with no acute distress.  EYES: Pupils equal, round, reactive to light and accommodation. No scleral icterus.  HEENT: Head atraumatic, normocephalic. Oropharynx and nasopharynx clear.  NECK:  Supple, no jugular venous distention. No thyroid enlargement, no tenderness.  LUNGS: Normal breath sounds bilaterally, no wheezing,bilateral rales up to mid lerally, no rhonchi. No use of  accessory muscles of respiration.  CARDIOVASCULAR: S1, S2 normal. No murmurs, rubs, or gallops.  ABDOMEN: Soft, nontender, nondistended. Bowel sounds present. No organomegaly or mass.  EXTREMITIES: No cyanosis, clubbing  ++edema b/l.    NEUROLOGIC: Cranial nerves II through XII are intact. No focal Motor or sensory deficits b/l.   PSYCHIATRIC:  patient is alert and oriented x 3.  SKIN: No obvious rash, lesion, or ulcer.   LABORATORY PANEL:  CBC Recent Labs  Lab 12/10/18 1819  WBC 5.8  HGB 13.6  HCT 43.0  PLT 149*    Chemistries  Recent Labs  Lab 12/10/18 1819 12/11/18 0529  NA 140  --   K 4.1  --   CL 108  --   CO2 23  --   GLUCOSE 104*  --   BUN 39*  --   CREATININE 2.22*  --   CALCIUM 9.4  --   MG  --  1.9   Cardiac Enzymes No results for input(s): TROPONINI in the last 168 hours. RADIOLOGY:  Dg Chest 2 View  Result Date: 12/10/2018 CLINICAL DATA:  85 year old male with chest pain cough runny nose and shortness of breath. EXAM: CHEST - 2 VIEW COMPARISON:  Chest radiographs 08/11/2018 and earlier. FINDINGS: PA and lateral views. Chronic elevation of the left hemidiaphragm. Stable cardiomegaly and mediastinal contours. Visualized tracheal air column is within normal limits. No pneumothorax, pulmonary edema, pleural effusion or acute pulmonary opacity. No acute osseous abnormality identified. Visible bowel-gas pattern within normal limits. IMPRESSION: No acute cardiopulmonary abnormality. Electronically Signed   By: Genevie Ann M.D.   On: 12/10/2018 19:36   ASSESSMENT AND PLAN:  [name]  is a 85 y.o. African-American  male with a known history of CHF, type diabetes mellitus, hypertension, atrial fibrillation and urolithiasis, who presented to the emergency room with acute onset of dry cough with associated dyspnea and wheezing that started yesterday.  He denied any fever or chills.  1.  Acute on chronic systolic CHF with acute on chronic hypoxic respiratory failure and  malignant HTN patient presented with increasing shortness of breath and leg edema. -per Wife he is been lately drinking lot of [place] mist and had some salty food. - follow serial troponin I's x3 flat -cont IV Lasix.   -Will obtain a 2D echo -Echo  10/2017 showed - Moderately reduced LVF,Globally depressed   EF=35-40%,Mild LVE   Borderline RV function. -  cardiology consultation with  Dr. [name]  -Reds Vest reading, daily weights, input output -On coreg, add hydralazine for BP -dietitian to see patient  2.Acute kidney injury on top of stage IIIb chronic kidney disease -likely prerenal due to his acute CHF induced renal hypoperfusion.  - Renal functions will be followed with diuresis. -Avoid nephrotoxic agents  2.  Hypertensive urgency.   -This likely contributing to #1. -  will continue his Coreg. -Added Hydralzine  3.  COPD.  - on as needed duo nebs.  I do not believe that he is in COPD exacerbation.  4.  Type 2 diabetes mellitus.   -We will place the patient on supplement coverage with NovoLog and continue his Ozempic.  5.  Dyslipidemia.  Statin therapy will be resumed.  6.  Atrial fibrillation. patient on his Eliquis and continue Coreg.  7.  DVT prophylaxis.  Continue Eliquis  Family communication : wife in the ER Consults : cardiology Dr. [name] Discharge Disposition : home CODE STATUS: full--- discussed with patient and wife in the ER DVT Prophylaxis : eliquis  TOTAL TIME TAKING CARE OF THIS PATIENT: 35 minutes.  >50% time spent on counselling and coordination of care  POSSIBLE D/C IN *1-2 DAYS, DEPENDING ON CLINICAL CONDITION.  Note: This dictation was prepared with Dragon dictation along with smaller phrase technology. Any transcriptional errors that result from this process are unintentional.  Fritzi Mandes M.D on 12/11/2018 at 3:13 PM  Between 7am to 6pm - Pager - [phone]  After 6pm go to [email or web address]  [facility]   CC: Primary  care physician; Ronnell Freshwater, NPPatient ID: [name], male   DOB: 1933-05-05, 85 y.o.   MRN: [id number]

## 2018-12-11 NOTE — ED Notes (Signed)
Meal tray given. Pt comfortable and denies any further needs.

## 2018-12-11 NOTE — ED Notes (Signed)
Report received from [facility]. Patient care assumed. Patient/RN introduction complete. Will continue to monitor. Pt pending admission, no co pain or discomfort at this time. Monitor in place, Iv intact, family at bedside.

## 2018-12-11 NOTE — ED Provider Notes (Signed)
-----------------------------------------   1:08 AM on 12/11/2018 -----------------------------------------  Reassessed patient who states he is feeling significantly better.  Has oxygen at home which he uses at night.  Will ambulate patient with pulse ox to see how he does.   ----------------------------------------- 1:30 AM on 12/11/2018 -----------------------------------------  Patient was not hypoxic on ambulation but heart rate jumped up to 137 and he returned to the bed tachypneic to a rate in the 30s.  Will administer DuoNeb, rapid antigen Covid swab and discuss with hospitalist to evaluate in the emergency department for admission.   Paulette Blanch, MD 12/11/18 [phone]

## 2018-12-11 NOTE — ED Notes (Signed)
Attempted to call 2A to speak with accepting nurse.  Nurse not available at this time, made myself available to receive a call if there were any questions from handoff.

## 2018-12-11 NOTE — Progress Notes (Signed)
CCMD called and said patient had a 2.3 sec.pause, has a carvedilol scheduled, ok to give per Dr. Posey Pronto when I notify her. RN will continue to monitor.

## 2018-12-11 NOTE — ED Notes (Signed)
No change in condition, pt awaiting bed assignment.

## 2018-12-11 NOTE — H&P (Addendum)
[place] [facility] [place] NAME: [name]    MR#:  [id number]  DATE OF BIRTH:  08-16-1933  DATE OF ADMISSION:  12/10/2018  PRIMARY CARE PHYSICIAN: Ronnell Freshwater, NP   REQUESTING/REFERRING PHYSICIAN: Lurline Hare, MD  CHIEF COMPLAINT:  Shortness of breath and cough  HISTORY OF PRESENT ILLNESS:  [name]  is a 85 y.o. African-American male with a known history of CHF, type diabetes mellitus, hypertension, atrial fibrillation and urolithiasis, who presented to the emergency room with acute onset of dry cough with associated dyspnea and wheezing that started yesterday.  He denied any fever or chills.  He admitted to chest burning only with cough.  No nausea vomiting or abdominal pain.  He admitted to mild dizziness and had he is on home O2 2 L/min.  No dysuria, oliguria or hematuria or flank pain.  No recent sick exposures to COVID-19.  When he came to the ER, blood pressure was 139/101 with a pulse of 129 and otherwise normal vital signs.  He remained tachycardic in the ER.  Upon ambulation for 20 feet his heart rate was up to 137 with respiratory to 30 next was confused down to 90% on room air.  The patient's elevated BUN/creatinine 39/2.2 compared to 27/1.95 on 08/11/2018.  BNP came back elevated at 430 and INR was 18 twice.  CBC was unremarkable.  His COVID-19 test is negative.  Portable chest ray showed mild stable cardiomegaly with no other acute findings.  Noncontrasted head CT scan revealed mild diffuse atrophy with no acute intracranial abnormalities.  The patient was given nebulized albuterol twice in the ER for concern about COPD exacerbation.  Will be admitted to a telemetry bed for further evaluation and management.  Past Medical History:  Diagnosis Date  . Atrial fibrillation ([place])   . CHF (congestive heart failure) ([place])   . Diabetes ([place])   . Hearing loss   . High blood pressure   . History of bladder problems   . Kidney stones 11/22/2014     PAST SURGICAL HISTORY:   Past Surgical History:  Procedure Laterality Date  . CATARACT EXTRACTION    . PROSTATE CANCER    . PROSTATE SURGERY      SOCIAL HISTORY:   Social History   Tobacco Use  . Smoking status: Former Smoker    Years: 16.00    Types: Cigarettes  . Smokeless tobacco: Never Used  Substance Use Topics  . Alcohol use: No    FAMILY HISTORY:   Family History  Problem Relation Age of Onset  . Colon cancer Mother   . Diabetes Other   . High blood pressure Other   . Prostate cancer Brother   . Diabetes Brother     DRUG ALLERGIES:  No Known Allergies  REVIEW OF SYSTEMS:   ROS As per history of present illness. All pertinent systems were reviewed above. Constitutional,  HEENT, cardiovascular, respiratory, GI, GU, musculoskeletal, neuro, psychiatric, endocrine,  integumentary and hematologic systems were reviewed and are otherwise  negative/unremarkable except for positive findings mentioned above in the HPI.   MEDICATIONS AT HOME:   Prior to Admission medications   Medication Sig Start Date End Date Taking? Authorizing Provider  apixaban (ELIQUIS) 5 MG TABS tablet Take 2.5 mg by mouth 2 (two) times daily. Per [name] change to half a tablet twice daily 12/03/17  Yes [provider]  atorvastatin (LIPITOR) 40 MG tablet Take 1 tablet (40 mg total) by mouth daily  at 6 PM. 11/16/18  Yes Boscia, Greer Ee, NP  carvedilol (COREG) 25 MG tablet Take 25 mg by mouth 2 (two) times daily with a meal.   Yes [provider]  furosemide (LASIX) 20 MG tablet Take 1 tablet (20 mg total) by mouth daily. 12/31/17  Yes Alisa Graff, FNP  ONETOUCH VERIO test strip USE UTD TO CHECK BLOOD SUGAR ONCE DX E11.65 02/13/18  Yes Boscia, Heather E, NP  pantoprazole (PROTONIX) 40 MG tablet TAKE 1 TABLET(40 MG) BY MOUTH DAILY 08/25/18  Yes Boscia, Heather E, NP  Semaglutide,0.25 or 0.5MG /DOS, (OZEMPIC, 0.25 OR 0.5 MG/DOSE,) 2 MG/1.5ML SOPN Inject 0.5 mg into the  skin once a week. 05/11/18  Yes Boscia, Greer Ee, NP  albuterol (VENTOLIN HFA) 108 (90 Base) MCG/ACT inhaler Inhale 2 puffs into the lungs every 4 (four) hours as needed for wheezing or shortness of breath. 12/10/18   Arta Silence, MD  azithromycin (ZITHROMAX Z-PAK) 250 MG tablet Take 2 tablets (500 mg) on  Day 1,  followed by 1 tablet (250 mg) once daily on Days 2 through 5. 12/10/18 12/15/18  Arta Silence, MD  dicyclomine (BENTYL) 10 MG capsule Take 2 capsules (20 mg total) by mouth 3 (three) times daily before meals. Patient not taking: Reported on 12/11/2018 02/24/18   Lin Landsman, MD  predniSONE (DELTASONE) 20 MG tablet Take 3 tablets (60 mg total) by mouth daily for 5 days. 12/11/18 12/16/18  Arta Silence, MD      VITAL SIGNS:  Blood pressure (!) 141/89, pulse (!) 51, temperature 98.1 F (36.7 C), temperature source Oral, resp. rate 20, height 6' (1.829 m), weight 113.4 kg, SpO2 93 %.  PHYSICAL EXAMINATION:  Physical Exam  GENERAL:  85 y.o.-year-old African-American male patient lying in the bed in mild respiratory distress with conversational dyspnea EYES: Pupils equal, round, reactive to light and accommodation. No scleral icterus. Extraocular muscles intact.  HEENT: Head atraumatic, normocephalic. Oropharynx and nasopharynx clear.  NECK:  Supple, no jugular venous distention. No thyroid enlargement, no tenderness.  LUNGS: Diminished bibasal breath sounds with bibasal rales. CARDIOVASCULAR: Regular rate and rhythm, S1, S2 normal. No murmurs, rubs, or gallops.  ABDOMEN: Soft, nondistended, nontender. Bowel sounds present. No organomegaly or mass.  EXTREMITIES: Trace bilateral lower extremity pitting edema, with no cyanosis, or clubbing.  NEUROLOGIC: Cranial nerves II through XII are intact. Muscle strength 5/5 in all extremities. Sensation intact. Gait not checked.  PSYCHIATRIC: The patient is alert and oriented x 3.  Normal affect and good eye  contact. SKIN: No obvious rash, lesion, or ulcer.   LABORATORY PANEL:   CBC Recent Labs  Lab 12/10/18 1819  WBC 5.8  HGB 13.6  HCT 43.0  PLT 149*   ------------------------------------------------------------------------------------------------------------------  Chemistries  Recent Labs  Lab 12/10/18 1819 12/11/18 0529  NA 140  --   K 4.1  --   CL 108  --   CO2 23  --   GLUCOSE 104*  --   BUN 39*  --   CREATININE 2.22*  --   CALCIUM 9.4  --   MG  --  1.9   ------------------------------------------------------------------------------------------------------------------  Cardiac Enzymes No results for input(s): TROPONINI in the last 168 hours. ------------------------------------------------------------------------------------------------------------------  RADIOLOGY:  Dg Chest 2 View  Result Date: 12/10/2018 CLINICAL DATA:  85 year old male with chest pain cough runny nose and shortness of breath. EXAM: CHEST - 2 VIEW COMPARISON:  Chest radiographs 08/11/2018 and earlier. FINDINGS: PA and lateral views. Chronic elevation of the left  hemidiaphragm. Stable cardiomegaly and mediastinal contours. Visualized tracheal air column is within normal limits. No pneumothorax, pulmonary edema, pleural effusion or acute pulmonary opacity. No acute osseous abnormality identified. Visible bowel-gas pattern within normal limits. IMPRESSION: No acute cardiopulmonary abnormality. Electronically Signed   By: Genevie Ann M.D.   On: 12/10/2018 19:36      IMPRESSION AND PLAN:   1.  Acute on chronic systolic CHF with acute on chronic hypoxic respiratory failure.  Patient will be admitted to telemetry bed.  We will follow serial troponin I's.  We will diurese him with IV Lasix.  Will obtain a 2D echo and a cardiology consultation by controlled clinic.  I notified Dr. [name] about the patient.  The patient's last 2D echo was done on 08/02/2017 and revealed EF of 35 to 40% with mild mitral  regurgitation and mild right ventricular dilatation.  We will check TSH and magnesium level, especially given sinus tachycardia.  The patient has mild acute kidney injury on top of stage IIIb chronic kidney disease, likely prerenal due to his acute CHF induced renal hypoperfusion.  Renal functions will be followed with diuresis.  2.  Hypertensive urgency.  This likely contributing to #1.  He will be placed on as needed IV labetalol and will continue his Coreg.  3.  COPD.  The patient will be placed on as needed duo nebs.  I do not believe that he is in COPD exacerbation.  4.  Type 2 diabetes mellitus.  We will place the patient on supplement coverage with NovoLog and continue his Ozempic.  5.  Dyslipidemia.  Statin therapy will be resumed.  6.  Atrial fibrillation.  Will obtain EKG and place the patient on his Eliquis and continue Coreg.  7.  DVT prophylaxis.  Continue Eliquis.  All the records are reviewed and case discussed with ED provider. The plan of care was discussed in details with the patient (and family). I answered all questions. The patient agreed to proceed with the above mentioned plan. Further management will depend upon hospital course.   CODE STATUS: Presumably full code  TOTAL TIME TAKING CARE OF THIS PATIENT: 55 minutes.    Christel Mormon M.D on 12/11/2018 at 7:36 AM  [facility]   From 7 PM-7 AM, contact night-coverage [email or web address]  CC: Primary care physician; Ronnell Freshwater, NP   Note: This dictation was prepared with Dragon dictation along with smaller phrase technology. Any transcriptional errors that result from this process are unintentional.

## 2018-12-11 NOTE — Consult Note (Addendum)
[facility] Cardiology  CARDIOLOGY CONSULT NOTE  Patient ID: [name] MRN: [id number] DOB/AGE: Mar 11, 1933 85 y.o.  Admit date: 12/10/2018 Referring Physician Dr. Posey Pronto Primary Physician Leretha Pol, NP Primary Cardiologist Dr. [name] Reason for Consultation Shortness of breath / Elevated BNP  HPI:  [name] is a 85 y.o. with a past medical history of permanent atrial fibrillation, non-ischemic cardiomyopathy (ECHO from 09/2018 with EF of 40%), HTN, and HLD, who presented to the ED yesterday after developing cough, shortness of breath, and runny nose earlier in the day. He reports waking up yesterday morning in his usual state of health but a few hours into his work day he started having coughing fits and associated shortness of breath, which is new for him. The cough has been non-productive but he has endorsed left sided chest discomfort described as a burning sensation during his coughing episodes. He denies any other chest pain. He reports no shortness of breath currently. There has been no recent swelling in his legs, abdominal distention, or orthopnea. His wife is concerned that he might have been eating more salt than what is recommended, and that this may have caused his recent symptoms.   ER work up revealed normal HS-troponin x2. BNP of 430. CXR without infiltrate or edema. EKG unremarkable. He has received multiple duoneb breathing treatments with improvement of his symptoms. He has also received lasix 40 mg IV.   Review of systems complete and found to be negative unless listed above   Past Medical History:  Diagnosis Date  . Atrial fibrillation ([place])   . CHF (congestive heart failure) ([place])   . Diabetes ([place])   . Hearing loss   . High blood pressure   . History of bladder problems   . Kidney stones 11/22/2014    Past Surgical History:  Procedure Laterality Date  . CATARACT EXTRACTION    . PROSTATE CANCER    . PROSTATE SURGERY      (Not in a hospital  admission)  Social History   Socioeconomic History  . Marital status: Married    Spouse name: Not on file  . Number of children: Not on file  . Years of education: Not on file  . Highest education level: Not on file  Occupational History  . 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 Financial resource strain: Not hard at all  . Food insecurity    Worry: Never true    Inability: Never true  . Transportation needs    Medical: No    Non-medical: No  Tobacco Use  . Smoking status: Former Smoker    Years: 16.00    Types: Cigarettes  . Smokeless tobacco: Never Used  Substance and Sexual Activity  . Alcohol use: No  . Drug use: No  . Sexual activity: Not Currently  Lifestyle  . Physical activity    Days per week: 0 days    Minutes per session: 0 min  . Stress: Not at all  Relationships  . Social Herbalist on phone: Twice a week    Gets together: Never    Attends religious service: 1 to 4 times per year    Active member of club or organization: No    Attends meetings of clubs or organizations: Never    Relationship status: Married  . Intimate partner violence    Fear of current or ex partner: No    Emotionally abused: No    Physically abused: No    Forced sexual  activity: No  Other Topics Concern  . Not on file  Social History Narrative  . Not on file    Family History  Problem Relation Age of Onset  . Colon cancer Mother   . Diabetes Other   . High blood pressure Other   . Prostate cancer Brother   . Diabetes Brother    Review of systems complete and found to be negative unless listed above   PHYSICAL EXAM  General: Well developed, well nourished, in no acute distress HEENT:  Normocephalic and atramatic Neck:  No JVD.  Lungs: Clear bilaterally to auscultation and percussion. Heart: Irregularly irregular rhythm. Normal rate. Normal S1 and S2 without gallops or murmurs.  Extremities: No clubbing, cyanosis or edema.   Neuro: Alert and oriented X  3. Psych:  Good affect, responds appropriately  Labs:   Lab Results  Component Value Date   WBC 5.8 12/10/2018   HGB 13.6 12/10/2018   HCT 43.0 12/10/2018   MCV 91.3 12/10/2018   PLT 149 (L) 12/10/2018    Recent Labs  Lab 12/10/18 1819  NA 140  K 4.1  CL 108  CO2 23  BUN 39*  CREATININE 2.22*  CALCIUM 9.4  GLUCOSE 104*   Lab Results  Component Value Date   CKTOTAL 70 10/12/2013   CKMB 2.1 10/12/2013   TROPONINI 0.03 (HH) 08/07/2017    Lab Results  Component Value Date   CHOL 97 (L) 06/27/2017   CHOL 140 10/12/2013   CHOL 125 06/13/2013   Lab Results  Component Value Date   HDL 36 (L) 06/27/2017   HDL 43 10/12/2013   HDL 39 (L) 06/13/2013   Lab Results  Component Value Date   LDLCALC 50 06/27/2017   LDLCALC 69 10/12/2013   LDLCALC 61 06/13/2013   Lab Results  Component Value Date   TRIG 55 06/27/2017   TRIG 139 10/12/2013   TRIG 123 06/13/2013   No results found for: CHOLHDL No results found for: LDLDIRECT    Radiology: Dg Chest 2 View  Result Date: 12/10/2018 CLINICAL DATA:  85 year old male with chest pain cough runny nose and shortness of breath. EXAM: CHEST - 2 VIEW COMPARISON:  Chest radiographs 08/11/2018 and earlier. FINDINGS: PA and lateral views. Chronic elevation of the left hemidiaphragm. Stable cardiomegaly and mediastinal contours. Visualized tracheal air column is within normal limits. No pneumothorax, pulmonary edema, pleural effusion or acute pulmonary opacity. No acute osseous abnormality identified. Visible bowel-gas pattern within normal limits. IMPRESSION: No acute cardiopulmonary abnormality. Electronically Signed   By: Genevie Ann M.D.   On: 12/10/2018 19:36    EKG: Atrial fibrillation, Rate of 66 BPM, 2 second pause noted, right axis deviation, right bundle branch block  ASSESSMENT AND PLAN:  [name] is an 85 year old male with a history of permanent atrial fibrillation, anticoagulated on renal dosed eliquis, heart failure with  reduced ejection fraction, HTN, and HLD, who presented to the ER yesterday for cough, shortness of breath, and rhinorrhea. ER workup suggestive of bronchitis versus CHF exacerbation.  Has received duonebs with some improvement of his symptoms. He has also received IV lasix 40 mg. Will be admitted for observation given likely CHF exacerbation.   #Shortness of breath,  #Elevated BNP,  #Likely CHF exacerbation: Patient presented to the ER with one day of cough and shortness of breath. BNP 430. CXR without evidence of pleural effusion or infiltrate. HS- troponin 18 > 18 > 14. Patient appears euvolemic. No lower extremity edema or  crackles on exam. Shortness of breath has improved after duonebs and lasix. Continue gentle diuresis.  - Agree with plan for gentle diuresis.   - Home lasix dose to be increased at time of discharge from 20 mg to 40 mg daily - Outpatient cardiology follow up in ~1-2 weeks with me or Dr. [name]  - Signing off for now. No further cardiac diagnostics indicated at this time. Please call for further questions or concerns.   #Permanent atrial fibrillation:  Remains in A-fib on EKG and telemetry. Had 2 second pause noted on EKG. Rate well controlled on home medications. Continue Eliquis for stroke risk reduction. Continue beta blocker for heart rate management.   The patient's history and exam findings were discussed with Dr. [name]. The plan was made in conjunction with Dr. [name].  Signed: Hilbert Odor PA-C 12/11/2018, 11:10 AM  The patient has been interviewed and examined. I agree with assessment and plan above. Serafina Royals MD [facility]

## 2018-12-11 NOTE — Progress Notes (Signed)
*  PRELIMINARY RESULTS* Echocardiogram 2D Echocardiogram has been performed.  [name] 12/11/2018, 2:14 PM

## 2018-12-11 NOTE — ED Notes (Signed)
AAOx3.  Skin warm and dry.  No SOB/ DOE.  NAD 

## 2018-12-12 DIAGNOSIS — I482 Chronic atrial fibrillation, unspecified: Secondary | ICD-10-CM

## 2018-12-12 DIAGNOSIS — I509 Heart failure, unspecified: Secondary | ICD-10-CM

## 2018-12-12 DIAGNOSIS — J449 Chronic obstructive pulmonary disease, unspecified: Secondary | ICD-10-CM

## 2018-12-12 DIAGNOSIS — N183 Chronic kidney disease, stage 3 unspecified: Secondary | ICD-10-CM

## 2018-12-12 LAB — GLUCOSE, CAPILLARY
Glucose-Capillary: 170 mg/dL — ABNORMAL HIGH (ref 70–99)
Glucose-Capillary: 120 mg/dL — ABNORMAL HIGH (ref 70–99)

## 2018-12-12 LAB — BASIC METABOLIC PANEL
Anion gap: 12 (ref 5–15)
BUN: 40 mg/dL — ABNORMAL HIGH (ref 8–23)
CO2: 25 mmol/L (ref 22–32)
Calcium: 9.4 mg/dL (ref 8.9–10.3)
Chloride: 102 mmol/L (ref 98–111)
Creatinine, Ser: 1.94 mg/dL — ABNORMAL HIGH (ref 0.61–1.24)
GFR calc Af Amer: 36 mL/min — ABNORMAL LOW (ref >60)
GFR calc non Af Amer: 31 mL/min — ABNORMAL LOW (ref >60)
Glucose, Bld: 159 mg/dL — ABNORMAL HIGH (ref 70–99)
Potassium: 4.1 mmol/L (ref 3.5–5.1)
Sodium: 139 mmol/L (ref 135–145)

## 2018-12-12 LAB — ECHOCARDIOGRAM COMPLETE
Height: 72 in
Weight: 4000.03 oz

## 2018-12-12 MED ORDER — FUROSEMIDE 40 MG PO TABS: 40.0000 mg | ORAL_TABLET | Freq: Every day | ORAL | 0 refills | Status: DC

## 2018-12-12 MED ORDER — FUROSEMIDE 40 MG PO TABS: 40.0000 mg | ORAL_TABLET | Freq: Every day | ORAL | Status: DC

## 2018-12-12 NOTE — Progress Notes (Signed)
[facility] Cardiology [facility] Encounter Note  Patient: GIOVANY COSBY / Admit Date: 12/10/2018 / Date of Encounter: 12/12/2018, 6:27 AM   Subjective: Patient feeling much better today.  No evidence of chest pain or significant shortness of breath.  Intravenous Lasix yesterday significantly helped patient's symptoms.  No evidence of cough or congestion or infection at this time.  Chest x-ray has shown no evidence of pulmonary edema.  Troponin level elevated at 18 consistent with demand ischemia rather than acute coronary syndrome.  Atrial fibrillation is with good heart rate control on carvedilol.  Review of Systems: Positive for: Shortness of breath Negative for: Vision change, hearing change, syncope, dizziness, nausea, vomiting,diarrhea, bloody stool, stomach pain, cough, congestion, diaphoresis, urinary frequency, urinary pain,skin lesions, skin rashes Others previously listed  Objective: Telemetry: Atrial fibrillation with bundle branch block with controlled rate Physical Exam: Blood pressure 118/84, pulse 67, temperature (!) 97.5 F (36.4 C), temperature source Oral, resp. rate 18, height 6' (1.829 m), weight 107.5 kg, SpO2 91 %. Body mass index is 32.16 kg/m. General: Well developed, well nourished, in no acute distress. Head: Normocephalic, atraumatic, sclera non-icteric, no xanthomas, nares are without discharge. Neck: No apparent masses Lungs: Normal respirations with no wheezes, few rhonchi, no rales , no crackles   Heart: Irregular rate and rhythm, normal S1 S2, no murmur, no rub, no gallop, PMI is normal size and placement, carotid upstroke normal without bruit, jugular venous pressure normal Abdomen: Soft, non-tender, non-distended with normoactive bowel sounds. No hepatosplenomegaly. Abdominal aorta is normal size without bruit Extremities: N trace edema, no clubbing, no cyanosis, no ulcers,  Peripheral: 2+ radial, 2+ femoral, 2+ dorsal pedal pulses Neuro: Alert and  oriented. Moves all extremities spontaneously. Psych:  Responds to questions appropriately with a normal affect.   Intake/Output Summary (Last 24 hours) at 12/12/2018 7253 Last data filed at 12/12/2018 0420 Gross per 24 hour  Intake -  Output 850 ml  Net -850 ml    Inpatient Medications:  . apixaban  2.5 mg Oral BID  . atorvastatin  40 mg Oral q1800  . carvedilol  25 mg Oral BID WC  . furosemide  40 mg Intravenous Q12H  . hydrALAZINE  25 mg Oral BID  . insulin aspart  0-9 Units Subcutaneous TID WC  . pantoprazole  40 mg Oral Daily  . sodium chloride flush  3 mL Intravenous Q12H   Infusions:  . sodium chloride      Labs: Recent Labs    12/10/18 1819 12/11/18 0529 12/12/18 0415  NA 140  --  139  K 4.1  --  4.1  CL 108  --  102  CO2 23  --  25  GLUCOSE 104*  --  159*  BUN 39*  --  40*  CREATININE 2.22*  --  1.94*  CALCIUM 9.4  --  9.4  MG  --  1.9  --    No results for input(s): AST, ALT, ALKPHOS, BILITOT, PROT, ALBUMIN in the last 72 hours. Recent Labs    12/10/18 1819  WBC 5.8  HGB 13.6  HCT 43.0  MCV 91.3  PLT 149*   No results for input(s): CKTOTAL, CKMB, TROPONINI in the last 72 hours. Invalid input(s): POCBNP Recent Labs    12/11/18 0529  HGBA1C 7.5*     Weights: Filed Weights   12/10/18 1807 12/12/18 0417  Weight: 113.4 kg 107.5 kg     Radiology/Studies:  Dg Chest 2 View  Result Date: 12/10/2018 CLINICAL DATA:  85 year old male with chest pain cough runny nose and shortness of breath. EXAM: CHEST - 2 VIEW COMPARISON:  Chest radiographs 08/11/2018 and earlier. FINDINGS: PA and lateral views. Chronic elevation of the left hemidiaphragm. Stable cardiomegaly and mediastinal contours. Visualized tracheal air column is within normal limits. No pneumothorax, pulmonary edema, pleural effusion or acute pulmonary opacity. No acute osseous abnormality identified. Visible bowel-gas pattern within normal limits. IMPRESSION: No acute cardiopulmonary  abnormality. Electronically Signed   By: Genevie Ann M.D.   On: 12/10/2018 19:36     Assessment and Recommendation  85 y.o. male with known hypertension hyperlipidemia chronic kidney disease and chronic atrial fibrillation with bundle branch block having acute diastolic dysfunction heart failure despite no evidence of edema by chest x-ray significantly improved with intravenous Lasix without evidence of myocardial infarction 1.  Continue intravenous Lasix this a.m. and change to oral Lasix 40 mg upon discharge 2.  Continue carvedilol for heart rate control of atrial fibrillation appears to be stable by telemetry today 3.  Hydralazine carvedilol combination for hypertension control without change 4.  Eliquis for further risk reduction stroke with atrial fibrillation and continue upon discharge 5.  High intensity cholesterol therapy 6.  No further cardiac diagnostics necessary at this time 7.  Begin ambulation and follow-up for improvements of symptoms and if feeling reasonably well this afternoon okay for discharged home with follow-up next week and further adjustments of medication management  Signed, Serafina Royals M.D. FACC

## 2018-12-12 NOTE — Discharge Summary (Signed)
[name] NAME: [name]    MR#:  [id number]  DATE OF BIRTH:  06-13-1933  DATE OF ADMISSION:  12/10/2018 ADMITTING PHYSICIAN: Christel Mormon, MD  DATE OF DISCHARGE: 12/12/2018  PRIMARY CARE PHYSICIAN: Ronnell Freshwater, NP    ADMISSION DIAGNOSIS:  Acute bronchitis, unspecified organism [J20.9] Chronic obstructive pulmonary disease, unspecified COPD type ([place]) [J44.9] Acute on chronic congestive heart failure, unspecified heart failure type ([place]) [I50.9]  DISCHARGE DIAGNOSIS:  acute on chronic systolic congestive heart failure malignant hypertension-- improved acute on chronic kidney disease stage IIIB chronic atrial fibrillation on anticoagulation SECONDARY DIAGNOSIS:   Past Medical History:  Diagnosis Date  . Atrial fibrillation ([place])   . CHF (congestive heart failure) ([place])   . Diabetes ([place])   . Hearing loss   . High blood pressure   . History of bladder problems   . Kidney stones 11/22/2014    HOSPITAL COURSE:  [name] a70 y.o.African-American malewith a known history of CHF, type diabetes mellitus, hypertension, atrial fibrillation and urolithiasis, who presented to the emergency room with acute onset of dry cough with associated dyspnea and wheezing that started yesterday. He denied any fever or chills.  1. Acute on chronic systolic/diastolic CHFwith acute on chronic hypoxic respiratory failure and malignant HTN -patient presented with increasing shortness of breath and some leg edema. -per Wife he is been lately drinking lot of [place] mist and had some salty food. - follow serial troponin I's x3 flat -cont IV Lasix.--diuresed well--change to oral lasix 40 mg qd for 7 days and then 20 mg daily--wife informed -Low salt diet material given to wife -weight 113.4kg---107 kg -Reds Vest >40 on admission--<35 today -Echo  10/2017 showed - Moderately reduced LVF,Globally depressed EF=35-40%,Mild  LVE -ECho 2020 shows EF 55-60% Borderline RV function.  -cardiology consultation with  Dr. [name] noted  -On coreg--BP well controlled. D/c hydralazine  2.Acute kidney injury on top of stage IIIb chronic kidney disease -likely prerenal due to his acute CHF induced renal hypoperfusion.  -Renal functions will be followed with diuresis. -Avoid nephrotoxic agents -creat improved with diuresis  2. Hypertensive urgency.  -This likely contributing to #1. -  will continue his Coreg. -BP better  3. COPD. - on as needed duo nebs. - I do not believe that he is in COPD exacerbation.  4. Type 2 diabetes mellitus.  -We will place the patient on supplement coverage with NovoLog and continue his Ozempic.  5. Dyslipidemia.Statin therapy will be resumed.  6. Atrial fibrillation. patient on his Eliquis and continue Coreg.  7.DVT prophylaxis.Continue Eliquis  Patient overall feels better. He has improved over the ER and hospital course. Will discharge to home with outpatient follow-up heart failure clinic, Dr. [name] and patient's primary care.  Family communication : wife [name] on the phone Consults : cardiology Dr. [name] Discharge Disposition : home CODE STATUS: full--- discussed with patient and wife in the ER DVT Prophylaxis : eliquis   CONSULTS OBTAINED:    DRUG ALLERGIES:  No Known Allergies  DISCHARGE MEDICATIONS:   Allergies as of 12/12/2018   No Known Allergies     Medication List    STOP taking these medications   dicyclomine 10 MG capsule Commonly known as: BENTYL   OneTouch Verio test strip Generic drug: glucose blood     TAKE these medications   albuterol 108 (90 Base) MCG/ACT inhaler Commonly known as: VENTOLIN HFA Inhale 2 puffs into the lungs every 4 (four)  hours as needed for wheezing or shortness of breath.   apixaban 5 MG Tabs tablet Commonly known as: ELIQUIS Take 2.5 mg by mouth 2 (two) times daily. Per [name]  change to half a tablet twice daily   atorvastatin 40 MG tablet Commonly known as: LIPITOR Take 1 tablet (40 mg total) by mouth daily at 6 PM.   carvedilol 25 MG tablet Commonly known as: COREG Take 25 mg by mouth 2 (two) times daily with a meal.   furosemide 40 MG tablet Commonly known as: LASIX Take 1 tablet (40 mg total) by mouth daily. Take 40 mg daily for 7 days and then 20 mg daily Start taking on: December 13, 2018 What changed:  medication strength how much to take additional instructions   pantoprazole 40 MG tablet Commonly known as: PROTONIX TAKE 1 TABLET(40 MG) BY MOUTH DAILY   predniSONE 20 MG tablet Commonly known as: Deltasone Take 3 tablets (60 mg total) by mouth daily for 5 days.   Semaglutide(0.25 or 0.5MG /DOS) 2 MG/1.5ML Sopn Commonly known as: Ozempic (0.25 or 0.5 MG/DOSE) Inject 0.5 mg into the skin once a week.       If you experience worsening of your admission symptoms, develop shortness of breath, life threatening emergency, suicidal or homicidal thoughts you must seek medical attention immediately by calling 911 or calling your MD immediately  if symptoms less severe.  You Must read complete instructions/literature along with all the possible adverse reactions/side effects for all the Medicines you take and that have been prescribed to you. Take any new Medicines after you have completely understood and accept all the possible adverse reactions/side effects.   Please note  You were cared for by a hospitalist during your hospital stay. If you have any questions about your discharge medications or the care you received while you were in the hospital after you are discharged, you can call the unit and asked to speak with the hospitalist on call if the hospitalist that took care of you is not available. Once you are discharged, your primary care physician will handle any further medical issues. Please note that NO REFILLS for any discharge medications will  be authorized once you are discharged, as it is imperative that you return to your primary care physician (or establish a relationship with a primary care physician if you do not have one) for your aftercare needs so that they can reassess your need for medications and monitor your lab values. Today   SUBJECTIVE  I am doing very well   VITAL SIGNS:  Blood pressure 116/74, pulse 69, temperature 97.7 F (36.5 C), temperature source Oral, resp. rate 19, height 6' (1.829 m), weight 107.5 kg, SpO2 95 %.  I/O:    Intake/Output Summary (Last 24 hours) at 12/12/2018 1121 Last data filed at 12/12/2018 0940 Gross per 24 hour  Intake -  Output 1150 ml  Net -1150 ml    PHYSICAL EXAMINATION:  GENERAL:  85 y.o.-year-old patient lying in the bed with no acute distress.  EYES: Pupils equal, round, reactive to light and accommodation. No scleral icterus. Extraocular muscles intact.  HEENT: Head atraumatic, normocephalic. Oropharynx and nasopharynx clear.  NECK:  Supple, no jugular venous distention. No thyroid enlargement, no tenderness.  LUNGS: Normal breath sounds bilaterally, no wheezing, rales,rhonchi or crepitation. No use of accessory muscles of respiration.  CARDIOVASCULAR: S1, S2 normal. No murmurs, rubs, or gallops.  ABDOMEN: Soft, non-tender, non-distended. Bowel sounds present. No organomegaly or mass.  EXTREMITIES: No pedal  edema, cyanosis, or clubbing.  NEUROLOGIC: Cranial nerves II through XII are intact. Muscle strength 5/5 in all extremities. Sensation intact. Gait not checked.  PSYCHIATRIC: The patient is alert and oriented x 3.  SKIN: No obvious rash, lesion, or ulcer.   DATA REVIEW:   CBC  Recent Labs  Lab 12/10/18 1819  WBC 5.8  HGB 13.6  HCT 43.0  PLT 149*    Chemistries  Recent Labs  Lab 12/11/18 0529 12/12/18 0415  NA  --  139  K  --  4.1  CL  --  102  CO2  --  25  GLUCOSE  --  159*  BUN  --  40*  CREATININE  --  1.94*  CALCIUM  --  9.4  MG 1.9  --      Microbiology Results   Recent Results (from the past 240 hour(s))  SARS CORONAVIRUS 2 (TAT 6-24 HRS) Nasopharyngeal Nasopharyngeal Swab     Status: None   Collection Time: 12/11/18  3:58 AM   Specimen: Nasopharyngeal Swab  Result Value Ref Range Status   SARS Coronavirus 2 NEGATIVE NEGATIVE Final    Comment: (NOTE) SARS-CoV-2 target nucleic acids are NOT DETECTED. The SARS-CoV-2 RNA is generally detectable in upper and lower respiratory specimens during the acute phase of infection. Negative results do not preclude SARS-CoV-2 infection, do not rule out co-infections with other pathogens, and should not be used as the sole basis for treatment or other patient management decisions. Negative results must be combined with clinical observations, patient history, and epidemiological information. The expected result is Negative. Fact Sheet for Patients: SugarRoll.be Fact Sheet for Healthcare Providers: https://www.woods-mathews.com/ This test is not yet approved or cleared by the [place] FDA and  has been authorized for detection and/or diagnosis of SARS-CoV-2 by FDA under an Emergency Use Authorization (EUA). This EUA will remain  in effect (meaning this test can be used) for the duration of the COVID-19 declaration under Section 56 4(b)(1) of the Act, 21 U.S.C. section 360bbb-3(b)(1), unless the authorization is terminated or revoked sooner. Performed at [facility], [place] [address], [place], [place] [address]     RADIOLOGY:  Dg Chest 2 View  Result Date: 12/10/2018 CLINICAL DATA:  85 year old male with chest pain cough runny nose and shortness of breath. EXAM: CHEST - 2 VIEW COMPARISON:  Chest radiographs 08/11/2018 and earlier. FINDINGS: PA and lateral views. Chronic elevation of the left hemidiaphragm. Stable cardiomegaly and mediastinal contours. Visualized tracheal air column is within normal limits. No pneumothorax,  pulmonary edema, pleural effusion or acute pulmonary opacity. No acute osseous abnormality identified. Visible bowel-gas pattern within normal limits. IMPRESSION: No acute cardiopulmonary abnormality. Electronically Signed   By: Genevie Ann M.D.   On: 12/10/2018 19:36     CODE STATUS:     Code Status Orders  (From admission, onward)         Start     Ordered   12/11/18 0451  Full code  Continuous     12/11/18 0454        Code Status History    Date Active Date Inactive Code Status Order ID Comments User Context   08/07/2017 1136 08/08/2017 1631 Full Code [id number]  Loletha Grayer, MD ED   08/02/2017 0206 08/04/2017 1947 Full Code [id number]  Lance Coon, MD ED   05/04/2016 2049 05/05/2016 1707 Full Code [id number]  Idelle Crouch, MD Inpatient   Advance Care Planning Activity     Family Discussion: with wife  [name] on the phone Consults:[facility]--[place]   TOTAL TIME TAKING CARE OF THIS PATIENT: *40 minutes.    Fritzi Mandes M.D on 12/12/2018 at 11:21 AM  Between 7am to 6pm - Pager - [phone] After 6pm go to [email or web address] - password [email or web address]  [facility]    CC: Primary care physician; Ronnell Freshwater, NP

## 2018-12-12 NOTE — Progress Notes (Signed)
Educated patient on discharge instructions and patient verbalized understanding. No questions at this time. Patient waiting on wife to pick him up to be driven home.

## 2018-12-14 ENCOUNTER — Telehealth: Payer: Self-pay

## 2018-12-14 NOTE — Telephone Encounter (Signed)
Reviewing his discharge summary, he was discharged on prednisone for 5 days. This will increase his blood sugars. Looks like they had planned to do sliding scale regular insulin to go with ozempic until blood sugars started to come back down, however, they did not give prescription for this. We can give sample for regular humalog or novolog along with sliding scale instructions. This should be done prior to eating. He needs to get the elderly sliding scale instructions. Will also need pen needles to go with novolog or humalog.

## 2018-12-14 NOTE — Telephone Encounter (Signed)
PT WAS NOTIFIED. WILL PICK UP HUMALOG TOMORROW MORNING WITH GERIATRIC SLIDING SCALE INSTRUCTIONS. PT HAS APPT Wednesday.

## 2018-12-15 ENCOUNTER — Encounter: Payer: Self-pay | Admitting: Internal Medicine

## 2018-12-15 ENCOUNTER — Ambulatory Visit (INDEPENDENT_AMBULATORY_CARE_PROVIDER_SITE_OTHER): Payer: Medicare Other | Admitting: Internal Medicine

## 2018-12-15 ENCOUNTER — Other Ambulatory Visit: Payer: Self-pay

## 2018-12-15 VITALS — BP 118/63 | HR 73 | Temp 95.8°F | Resp 16 | Ht 72.0 in | Wt 238.0 lb

## 2018-12-15 DIAGNOSIS — I5022 Chronic systolic (congestive) heart failure: Secondary | ICD-10-CM | POA: Diagnosis not present

## 2018-12-15 DIAGNOSIS — J440 Chronic obstructive pulmonary disease with acute lower respiratory infection: Secondary | ICD-10-CM | POA: Diagnosis not present

## 2018-12-15 DIAGNOSIS — E1121 Type 2 diabetes mellitus with diabetic nephropathy: Secondary | ICD-10-CM

## 2018-12-15 DIAGNOSIS — J209 Acute bronchitis, unspecified: Secondary | ICD-10-CM

## 2018-12-15 DIAGNOSIS — I480 Paroxysmal atrial fibrillation: Secondary | ICD-10-CM | POA: Diagnosis not present

## 2018-12-15 DIAGNOSIS — N1832 Chronic kidney disease, stage 3b: Secondary | ICD-10-CM

## 2018-12-15 LAB — GLUCOSE, POCT (MANUAL RESULT ENTRY): POC Glucose: 211 mg/dl — AB (ref 70–99)

## 2018-12-15 NOTE — Progress Notes (Signed)
[facility] [place], [place] [id number]  Internal MEDICINE  Office Visit Note  Patient Name: [name]  [id number]  [id number]  Date of Service: 12/24/2018  Transitional care management!!   Chief Complaint  Patient presents with  . Hospitalization Follow-up    acute bronchitis, copd, acute chf  . Hypertension  . Diabetes    pt was on prednisone due to ed visit     HPI    Pt is 85 years old male with multiple medical problems. Pt is here for recent hospital follow up. Pt was hospitalized for acute CHF and Acute copd. Pt is feeling better however his blood sugars have been elevated due to prednisone, His wife seems to be concerned about it. He was also given increased dose of Lasix, He does have CKD and showed elevation in his BUN His blood sugar is stable on weekly Ozempic.( hg a1c 7.1, and 7.5)   He has atrial fibrillation and on [place]ac ( Eliquis), heart rate is controlled Current Medication: Outpatient Encounter Medications as of 12/15/2018  Medication Sig Note  . albuterol (VENTOLIN HFA) 108 (90 Base) MCG/ACT inhaler Inhale 2 puffs into the lungs every 4 (four) hours as needed for wheezing or shortness of breath. 12/22/2018: Caregiver reports using prn; used last this morning  . apixaban (ELIQUIS) 5 MG TABS tablet Take 2.5 mg by mouth 2 (two) times daily. Per [name] change to half a tablet twice daily   . atorvastatin (LIPITOR) 40 MG tablet Take 1 tablet (40 mg total) by mouth daily at 6 PM.   . carvedilol (COREG) 25 MG tablet Take 25 mg by mouth 2 (two) times daily with a meal.   . pantoprazole (PROTONIX) 40 MG tablet TAKE 1 TABLET(40 MG) BY MOUTH DAILY   . Semaglutide,0.25 or 0.5MG /DOS, (OZEMPIC, 0.25 OR 0.5 MG/DOSE,) 2 MG/1.5ML SOPN Inject 0.5 mg into the skin once a week. 12/11/2018: On tuesdays  . [DISCONTINUED] azithromycin (ZITHROMAX) 250 MG tablet Take 250 mg by mouth daily. Take 2 tab by mouth on day 1, then take 1 tab by mouth daily for 4  days 12/22/2018: Caregiver/ spouse reports patient took as prescribed and this now completed  . [DISCONTINUED] furosemide (LASIX) 40 MG tablet Take 1 tablet (40 mg total) by mouth daily. Take 40 mg daily for 7 days and then 20 mg daily (Patient taking differently: Take 20 mg by mouth daily. ) 12/22/2018: Caregiver reports now taking 20 mg (post-discharge day # 10)  . furosemide (LASIX) 20 MG tablet Take 1 tablet (20 mg total) by mouth daily.   . [DISCONTINUED] predniSONE (DELTASONE) 20 MG tablet Take 3 tablets (60 mg total) by mouth daily for 5 days. (Patient not taking: Reported on 12/15/2018)    No facility-administered encounter medications on file as of 12/15/2018.     Surgical History: Past Surgical History:  Procedure Laterality Date  . CATARACT EXTRACTION    . PROSTATE CANCER    . PROSTATE SURGERY      Medical History: Past Medical History:  Diagnosis Date  . Atrial fibrillation ([place])   . CHF (congestive heart failure) ([place])   . Diabetes ([place])   . Hearing loss   . High blood pressure   . History of bladder problems   . Kidney stones 11/22/2014    Family History: Family History  Problem Relation Age of Onset  . Colon cancer Mother   . Diabetes Other   . High blood pressure Other   . Prostate  cancer Brother   . Diabetes Brother     Social History   Socioeconomic History  . Marital status: Married    Spouse name: Not on file  . Number of children: Not on file  . Years of education: Not on file  . Highest education level: Not on file  Occupational History  . 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 Financial resource strain: Not hard at all  . Food insecurity    Worry: Never true    Inability: Never true  . Transportation needs    Medical: No    Non-medical: No  Tobacco Use  . Smoking status: Former Smoker    Years: 16.00    Types: Cigarettes  . Smokeless tobacco: Never Used  Substance and Sexual Activity  . Alcohol use: No  . Drug use: No  . Sexual  activity: Not Currently  Lifestyle  . Physical activity    Days per week: 0 days    Minutes per session: 0 min  . Stress: Not at all  Relationships  . Social Herbalist on phone: Twice a week    Gets together: Never    Attends religious service: 1 to 4 times per year    Active member of club or organization: No    Attends meetings of clubs or organizations: Never    Relationship status: Married  . Intimate partner violence    Fear of current or ex partner: No    Emotionally abused: No    Physically abused: No    Forced sexual activity: No  Other Topics Concern  . Not on file  Social History Narrative  . Not on file    Review of Systems  Constitutional: Negative for chills, fatigue and unexpected weight change.  HENT: Negative for congestion, rhinorrhea, sneezing and sore throat.   Eyes: Negative for redness.  Respiratory: Negative for cough, chest tightness and shortness of breath.   Cardiovascular: Negative for chest pain and palpitations.  Gastrointestinal: Negative for abdominal pain, constipation, diarrhea, nausea and vomiting.  Genitourinary: Negative for dysuria and frequency.  Musculoskeletal: Negative for arthralgias, back pain, joint swelling and neck pain.  Skin: Negative for rash.  Neurological: Negative.  Negative for tremors and numbness.  Hematological: Negative for adenopathy. Does not bruise/bleed easily.  Psychiatric/Behavioral: Negative for behavioral problems (Depression), sleep disturbance and suicidal ideas. The patient is not nervous/anxious.     Vital Signs: BP 118/63   Pulse 73   Temp (!) 95.8 F (35.4 C)   Resp 16   Ht 6' (1.829 m)   Wt 238 lb (108 kg)   SpO2 99%   BMI 32.28 kg/m    Physical Exam Constitutional:      General: He is not in acute distress.    Appearance: He is well-developed. He is not diaphoretic.  HENT:     Head: Normocephalic and atraumatic.     Mouth/Throat:     Pharynx: No oropharyngeal exudate.  Eyes:      Pupils: Pupils are equal, round, and reactive to light.  Neck:     Musculoskeletal: Normal range of motion and neck supple.     Thyroid: No thyromegaly.     Vascular: No JVD.     Trachea: No tracheal deviation.  Cardiovascular:     Rate and Rhythm: Normal rate and regular rhythm.     Heart sounds: Normal heart sounds. No murmur. No friction rub. No gallop.   Pulmonary:     Effort: Pulmonary  effort is normal. No respiratory distress.     Breath sounds: No wheezing or rales.  Chest:     Chest wall: No tenderness.  Abdominal:     General: Bowel sounds are normal.     Palpations: Abdomen is soft.  Musculoskeletal: Normal range of motion.  Lymphadenopathy:     Cervical: No cervical adenopathy.  Skin:    General: Skin is warm and dry.  Neurological:     Mental Status: He is alert and oriented to person, place, and time.     Cranial Nerves: No cranial nerve deficit.  Psychiatric:        Behavior: Behavior normal.        Thought Content: Thought content normal.        Judgment: Judgment normal.    Assessment/Plan: 1. Type 2 diabetes mellitus with stage 3b chronic kidney disease, without long-term current use of insulin (HCC) - Discontinue steroids, continue to monitor blood sugars at home. Pt exam is normal - POCT Glucose (CBG)  2. Chronic systolic heart failure (HCC) - Pt is stable, reduce Lasix to 20 mg qd on his previous use, caution with CKD  3. Paroxysmal A-fib (HCC) - Heart rate is controlled, continue coreg and Eliquis as before   4. Acute bronchitis with COPD ([place]) - Resolved, pt does use albuterol, need to look into getting PFT;s and repeat CXR   General Counseling: Tia Masker understanding of the findings of todays visit and agrees with plan of treatment. I have discussed any further diagnostic evaluation that may be needed or ordered today. We also reviewed his medications today. he has been encouraged to call the office with any questions or concerns that  should arise related to todays visit.  Pt was instructed to be careful to exposures to smoking, to sick people and to extreme weathers. To notify the office as soon as the patient starts to have any symptoms of cough , fatigue and fever. Proper use of MDI is stressed along with compliance.   Orders Placed This Encounter  Procedures  . POCT Glucose (CBG)   I have reviewed all medical records from hospital follow up including radiology reports and consults from other physicians. Appropriate follow up diagnostics will be scheduled as needed. Patient/ Family understands the plan of treatment. Time spent 35 minutes.  Dr Lavera Guise, MD Internal Medicine

## 2018-12-16 ENCOUNTER — Ambulatory Visit: Payer: Medicare Other | Admitting: Adult Health

## 2018-12-21 ENCOUNTER — Other Ambulatory Visit: Payer: Self-pay | Admitting: *Deleted

## 2018-12-21 ENCOUNTER — Encounter: Payer: Self-pay | Admitting: *Deleted

## 2018-12-21 DIAGNOSIS — I10 Essential (primary) hypertension: Secondary | ICD-10-CM | POA: Diagnosis not present

## 2018-12-21 DIAGNOSIS — I482 Chronic atrial fibrillation, unspecified: Secondary | ICD-10-CM | POA: Diagnosis not present

## 2018-12-21 DIAGNOSIS — G4733 Obstructive sleep apnea (adult) (pediatric): Secondary | ICD-10-CM | POA: Diagnosis not present

## 2018-12-21 DIAGNOSIS — E782 Mixed hyperlipidemia: Secondary | ICD-10-CM | POA: Diagnosis not present

## 2018-12-21 DIAGNOSIS — I5043 Acute on chronic combined systolic (congestive) and diastolic (congestive) heart failure: Secondary | ICD-10-CM | POA: Diagnosis not present

## 2018-12-21 NOTE — Patient Outreach (Signed)
[facility]) Care Management [place] [place] Red Alert/ General Discharge PCP office completes Transition of Care follow up post-hospital discharge Post-hospital discharge day # 9  12/21/2018  [name] 11-22-1933 [id number]  EMMI Red Alert- General Discharge EMMI call date/ day: Thursday December 17, 2018- General discharge day # 4 Red alert reason: Unfilled prescriptions  Successful telephone outreach to spouse of [name], 85 y/o male referred to [facility] RN CM by [facility] CMA after patient had EMMI Red-alert for general discharge; patient was recently hospitalized November 19-21, 2020 at [facility] for bronchitis and CHF exacerbation.  Patient has history including, but not limited to, CHF; HTN/ HLD; CKD; DM- type II with long-term insulin use; OSA; pAF; and COPD.  HIPAA/ identity verified with spouse during phone call today; patient's spouse immediately reports that patient is "doing fine/ back to his normal self;" and she tells me that they are currently at one of his post-hospital discharge doctor's appointments.  Briefly discussed purpose of today's call with spouse, who requests call back tomorrow to follow up on EMMI Red-alert; spouse also commented that he "has everything needs;" and that they have attended several post-hospital discharge doctor's appointments and "got a good report" at each appointment.  Spouse tells me today that she "has no concerns" around patient's condition post-hospital discharge, and she is agreeable to call tomorrow to follow up.  Plan:  Will re-attempt call to patient/ caregiver tomorrow, as requested today by patient's spouse  Oneta Rack, RN, BSN, [place] Coordinator [facility] Care Management  [phone]

## 2018-12-22 ENCOUNTER — Other Ambulatory Visit: Payer: Self-pay | Admitting: *Deleted

## 2018-12-22 ENCOUNTER — Encounter: Payer: Self-pay | Admitting: *Deleted

## 2018-12-22 NOTE — Patient Outreach (Signed)
[facility]) Care Management [facility] CM Telephone Outreach, [place] Red Alert/ General Discharge Transition of Care follow up completed by PCP office [facility] Discharge day # 10  12/22/2018  [name] 12/10/1933 [id number]  EMMI Red Alert- General Discharge EMMI call date/ day: Thursday December 17, 2018- General discharge day # 4 Red alert reason: Unfilled prescriptions  Successful telephone outreach to spouse [name], 85 y/o male referred to [facility] RN CM by [facility] CMA after patient had EMMI Red-alert for general discharge; patient was recently hospitalized November 19-21, 2020 at [facility] for bronchitis and CHF exacerbation.  Patient has history including, but not limited to, CHF; HTN/ HLD; CKD; DM- type II with long-term insulin use; OSA; pAF; and COPD.  HIPAA/ identity verified with spouse and patient while phone on speaker mode; purpose of call explained and patient and spouse both participate in phone call today while phone continuously on speaker mode.  Patient/ spouse deny clinical concerns with patient's condition post-hospital discharge; reports that patient has visited with both cardiologist and PCP post- recent hospital discharge; deny medication concerns-- patient was recently discharged from hospital and all medications were reviewed during phone call today.  Patient manages his own medications using pill box, and he confirms that he has all of his prescribed medications and adds that both his PCP and cardiology providers have reviewed his medications with him post- recent hospital discharge.  EMMI Red Alert screening completed and no concerns were identified; SDOH updated today for: depression/ transportation/ financial resource strain/ and food insecurity.  Discussed with patient and caregiver [name] CM program, as a part of patient's health insurance benefits; however, patient declines participating in [facility] CM program, as he continues to  work 5 days a week, during the day; patient confirms that he monitors/ records daily weights at home and uses home O2 at night only; he verbalizes a good general understanding of the purpose/ rationale for daily weight monitoring at home, wlong with action plan for weight gain in setting of CHF.  Discussed with patient and spouse that I would place [facility] CM letter in mail should they change their mind and decide to participate in [facility] CM program.   Patient denies further issues, concerns, or problems today.  I provided patient/ spouse with my direct phone number should they decide to participate in [facility] CM program.    Plan:  Will close EMMI case, as patient has declined ongoing participation in [place] program for now  Will place successful outreach letter in mail to patient, should he change his mind and wish to participate in [facility] CM program  Oneta Rack, RN, BSN, [place] Care Management  [phone]

## 2018-12-24 ENCOUNTER — Ambulatory Visit: Payer: Medicare Other | Admitting: Podiatry

## 2018-12-24 MED ORDER — FUROSEMIDE 20 MG PO TABS: 20.0000 mg | ORAL_TABLET | Freq: Every day | ORAL | 3 refills | Status: DC

## 2018-12-26 NOTE — Progress Notes (Signed)
Patient ID: [name], male    DOB: 1933-09-07, 85 y.o.   MRN: [id number]  HPI  [name] is a 85 y/o male with a history of DM, HTN, CKD, atrial fibrillation, previous tobacco use and chronic heart failure.   Echo report from 12/11/2018 reviewed and showed an EF of 55-60% along with mild/ moderate Glenn and mild TR. Echo report from 10/01/2018 reviewed and showed an EF of 40% along with moderate Glenn/TR. Echo report from 08/02/17 reviewed and showed an EF of 35-40% along with mild Glenn.   Admitted 12/10/2018 due to acute on chronic HF along with malignant HTN. Cardiology consult obtained. Initially needed IV lasix and then transitioned to oral diuretics. REDS vest reading went from 40% down to 35%. Discharged after 2 days. Was in the ED 08/11/2018 due to dizziness where he was treated and released.   He presents today for a follow-up visit with a chief complaint of a dry cough. Says that this has been chronic in nature occurring over several years and occurs on an intermittent basis. He has associated neck pain along with this. He denies any difficulty sleeping, dizziness, abdominal distention, palpitations, pedal edema, chest pain, shortness of breath, fatigue or weight gain. Overall feels much better since his recent admission.    Past Medical History:  Diagnosis Date  . Atrial fibrillation ([place])   . CHF (congestive heart failure) ([place])   . Diabetes ([place])   . Hearing loss   . High blood pressure   . History of bladder problems   . Kidney stones 11/22/2014   Past Surgical History:  Procedure Laterality Date  . CATARACT EXTRACTION    . PROSTATE CANCER    . PROSTATE SURGERY     Family History  Problem Relation Age of Onset  . Colon cancer Mother   . Diabetes Other   . High blood pressure Other   . Prostate cancer Brother   . Diabetes Brother    Social History   Tobacco Use  . Smoking status: Former Smoker    Years: 16.00    Types: Cigarettes  . Smokeless tobacco: Never Used   Substance Use Topics  . Alcohol use: No   No Known Allergies  Prior to Admission medications   Medication Sig Start Date End Date Taking? Authorizing Provider  albuterol (VENTOLIN HFA) 108 (90 Base) MCG/ACT inhaler Inhale 2 puffs into the lungs every 4 (four) hours as needed for wheezing or shortness of breath. 12/10/18  Yes Arta Silence, MD  apixaban (ELIQUIS) 5 MG TABS tablet Take 2.5 mg by mouth 2 (two) times daily. Per [name] change to half a tablet twice daily 12/03/17  Yes [provider]  atorvastatin (LIPITOR) 40 MG tablet Take 1 tablet (40 mg total) by mouth daily at 6 PM. 11/16/18  Yes Boscia, Heather E, NP  carvedilol (COREG) 25 MG tablet Take 25 mg by mouth 2 (two) times daily with a meal.   Yes [provider]  furosemide (LASIX) 20 MG tablet Take 1 tablet (20 mg total) by mouth daily. 12/24/18  Yes Lavera Guise, MD  pantoprazole (PROTONIX) 40 MG tablet TAKE 1 TABLET(40 MG) BY MOUTH DAILY 08/25/18  Yes Boscia, Heather E, NP  Semaglutide,0.25 or 0.5MG /DOS, (OZEMPIC, 0.25 OR 0.5 MG/DOSE,) 2 MG/1.5ML SOPN Inject 0.5 mg into the skin once a week. 05/11/18  Yes Ronnell [place], NP    Review of Systems  Constitutional: Negative for appetite change and fatigue.  HENT: Negative for congestion,  postnasal drip and sore throat.   Eyes: Negative.   Respiratory: Positive for cough (dry cough). Negative for chest tightness and shortness of breath.   Cardiovascular: Negative for chest pain, palpitations and leg swelling.  Gastrointestinal: Negative for abdominal distention, abdominal pain, anal bleeding and blood in stool.  Endocrine: Negative.   Genitourinary: Negative.   Musculoskeletal: Positive for neck pain. Negative for back pain.  Skin: Negative.   Allergic/Immunologic: Negative.   Neurological: Negative for dizziness and light-headedness.  Hematological: Negative for adenopathy. Does not bruise/bleed easily.  Psychiatric/Behavioral: Negative for  dysphoric mood and sleep disturbance. The patient is not nervous/anxious.    Vitals:   12/28/18 1129  BP: (!) 157/127  Pulse: 93  Resp: 18  SpO2: 97%  Weight: 238 lb 6.4 oz (108.1 kg)  Height: 5\' 7"  (1.702 m)   Wt Readings from Last 3 Encounters:  12/28/18 238 lb 6.4 oz (108.1 kg)  12/15/18 238 lb (108 kg)  12/12/18 237 lb 1.6 oz (107.5 kg)   Lab Results  Component Value Date   CREATININE 1.94 (H) 12/12/2018   CREATININE 2.22 (H) 12/10/2018   CREATININE 1.95 (H) 08/11/2018    Physical Exam Vitals signs and nursing note reviewed.  Constitutional:      Appearance: He is well-developed.  HENT:     Head: Normocephalic and atraumatic.  Neck:     Musculoskeletal: Normal range of motion and neck supple.     Vascular: No JVD.  Cardiovascular:     Rate and Rhythm: Normal rate. Rhythm irregular.  Pulmonary:     Effort: Pulmonary effort is normal. No respiratory distress.     Breath sounds: No wheezing or rales.  Abdominal:     General: There is no distension.     Palpations: Abdomen is soft.  Musculoskeletal:     Right lower leg: He exhibits no tenderness. No edema.     Left lower leg: He exhibits no tenderness. No edema.  Skin:    General: Skin is warm and dry.  Neurological:     Mental Status: He is alert and oriented to person, place, and time.  Psychiatric:        Behavior: Behavior normal.    Assessment & Plan:  1: Chronic heart failure with now preserved ejection fraction- - NYHA class I - euvolemic today - weighing daily and he was reminded to call for an overnight weight gain of >2 pounds or a weekly weight gain of >5 pounds - weight down 12 pounds from last visit here 2 months ago - not adding salt and his wife has been reading food labels. Reminded to closely follow a 2000mg  sodium diet  - saw cardiology [name]) 12/21/2018 - wearing compression socks daily - drinking ~ 40+ ounces of fluids daily - wearing oxygen at 2L at bedtime - BNP 12/11/2018 was  430.0 - has been discharged from paramedicine program; he can resume care in the future if needed  - has received his flu vaccine for this season  2: HTN- - BP looks good today - saw PCP Humphrey Rolls) 12/15/2018 - BMP 12/12/2018 reviewed and showed sodium 139, potassium 4.1, creatinine 1.94 and GFR 31  3: DM- - glucose at home was 129 - A1c 12/11/2018 was 7.5%   Patient did not bring his medications nor a list. Each medication was verbally reviewed with the patient and he was encouraged to bring the bottles to every visit to confirm accuracy of list.  Return in 6 months or sooner for any  questions/problems before then.     Marland Kitchen

## 2018-12-28 ENCOUNTER — Other Ambulatory Visit: Payer: Self-pay

## 2018-12-28 ENCOUNTER — Encounter: Payer: Self-pay | Admitting: Family

## 2018-12-28 ENCOUNTER — Ambulatory Visit: Payer: Medicare Other | Attending: Family | Admitting: Family

## 2018-12-28 VITALS — BP 104/65 | HR 93 | Resp 18 | Ht 67.0 in | Wt 238.4 lb

## 2018-12-28 DIAGNOSIS — Z7901 Long term (current) use of anticoagulants: Secondary | ICD-10-CM | POA: Insufficient documentation

## 2018-12-28 DIAGNOSIS — N189 Chronic kidney disease, unspecified: Secondary | ICD-10-CM | POA: Insufficient documentation

## 2018-12-28 DIAGNOSIS — Z79899 Other long term (current) drug therapy: Secondary | ICD-10-CM | POA: Diagnosis not present

## 2018-12-28 DIAGNOSIS — I5032 Chronic diastolic (congestive) heart failure: Secondary | ICD-10-CM

## 2018-12-28 DIAGNOSIS — I130 Hypertensive heart and chronic kidney disease with heart failure and stage 1 through stage 4 chronic kidney disease, or unspecified chronic kidney disease: Secondary | ICD-10-CM | POA: Diagnosis not present

## 2018-12-28 DIAGNOSIS — E1122 Type 2 diabetes mellitus with diabetic chronic kidney disease: Secondary | ICD-10-CM | POA: Diagnosis not present

## 2018-12-28 DIAGNOSIS — M542 Cervicalgia: Secondary | ICD-10-CM | POA: Insufficient documentation

## 2018-12-28 DIAGNOSIS — I509 Heart failure, unspecified: Secondary | ICD-10-CM | POA: Insufficient documentation

## 2018-12-28 DIAGNOSIS — R05 Cough: Secondary | ICD-10-CM | POA: Diagnosis not present

## 2018-12-28 DIAGNOSIS — I10 Essential (primary) hypertension: Secondary | ICD-10-CM

## 2018-12-28 DIAGNOSIS — N1832 Chronic kidney disease, stage 3b: Secondary | ICD-10-CM

## 2018-12-28 NOTE — Patient Instructions (Signed)
Continue weighing daily and call for an overnight weight gain of > 2 pounds or a weekly weight gain of >5 pounds. 

## 2019-01-01 ENCOUNTER — Telehealth: Payer: Self-pay

## 2019-01-01 NOTE — Telephone Encounter (Signed)
LMOM FOR PATIENT TO CONFIRM AND SCREEN FOR 01-05-19 OV.

## 2019-01-05 ENCOUNTER — Ambulatory Visit: Payer: Medicare Other | Admitting: Nurse Practitioner

## 2019-01-20 DIAGNOSIS — G4733 Obstructive sleep apnea (adult) (pediatric): Secondary | ICD-10-CM | POA: Diagnosis not present

## 2019-01-27 ENCOUNTER — Telehealth: Payer: Self-pay

## 2019-01-27 NOTE — Telephone Encounter (Signed)
CONFIRMED AND SCREENED FOR 01-29-19 OV.

## 2019-01-29 ENCOUNTER — Encounter (INDEPENDENT_AMBULATORY_CARE_PROVIDER_SITE_OTHER): Payer: Medicare Other

## 2019-01-29 ENCOUNTER — Ambulatory Visit (INDEPENDENT_AMBULATORY_CARE_PROVIDER_SITE_OTHER): Payer: Medicare Other | Admitting: Vascular Surgery

## 2019-01-29 ENCOUNTER — Ambulatory Visit: Payer: Medicare Other | Admitting: Nurse Practitioner

## 2019-02-05 ENCOUNTER — Encounter (INDEPENDENT_AMBULATORY_CARE_PROVIDER_SITE_OTHER): Payer: Self-pay | Admitting: Vascular Surgery

## 2019-02-05 ENCOUNTER — Ambulatory Visit (INDEPENDENT_AMBULATORY_CARE_PROVIDER_SITE_OTHER): Payer: Medicare Other | Admitting: Nurse Practitioner

## 2019-02-05 ENCOUNTER — Encounter: Payer: Self-pay | Admitting: Nurse Practitioner

## 2019-02-05 ENCOUNTER — Other Ambulatory Visit: Payer: Self-pay

## 2019-02-05 ENCOUNTER — Ambulatory Visit (INDEPENDENT_AMBULATORY_CARE_PROVIDER_SITE_OTHER): Payer: Medicare Other

## 2019-02-05 ENCOUNTER — Ambulatory Visit (INDEPENDENT_AMBULATORY_CARE_PROVIDER_SITE_OTHER): Payer: Medicare Other | Admitting: Vascular Surgery

## 2019-02-05 VITALS — BP 123/93 | HR 88 | Resp 16 | Ht 72.0 in | Wt 247.4 lb

## 2019-02-05 VITALS — BP 138/86 | HR 120 | Resp 12 | Ht 72.0 in | Wt 246.0 lb

## 2019-02-05 DIAGNOSIS — I10 Essential (primary) hypertension: Secondary | ICD-10-CM

## 2019-02-05 DIAGNOSIS — I714 Abdominal aortic aneurysm, without rupture, unspecified: Secondary | ICD-10-CM

## 2019-02-05 DIAGNOSIS — N1832 Chronic kidney disease, stage 3b: Secondary | ICD-10-CM

## 2019-02-05 DIAGNOSIS — E782 Mixed hyperlipidemia: Secondary | ICD-10-CM | POA: Diagnosis not present

## 2019-02-05 DIAGNOSIS — E1165 Type 2 diabetes mellitus with hyperglycemia: Secondary | ICD-10-CM | POA: Diagnosis not present

## 2019-02-05 DIAGNOSIS — E1121 Type 2 diabetes mellitus with diabetic nephropathy: Secondary | ICD-10-CM

## 2019-02-05 DIAGNOSIS — I482 Chronic atrial fibrillation, unspecified: Secondary | ICD-10-CM | POA: Diagnosis not present

## 2019-02-05 DIAGNOSIS — I480 Paroxysmal atrial fibrillation: Secondary | ICD-10-CM

## 2019-02-05 DIAGNOSIS — K551 Chronic vascular disorders of intestine: Secondary | ICD-10-CM

## 2019-02-05 DIAGNOSIS — K5730 Diverticulosis of large intestine without perforation or abscess without bleeding: Secondary | ICD-10-CM

## 2019-02-05 DIAGNOSIS — E1122 Type 2 diabetes mellitus with diabetic chronic kidney disease: Secondary | ICD-10-CM

## 2019-02-05 LAB — POCT GLYCOSYLATED HEMOGLOBIN (HGB A1C): Hemoglobin A1C: 7.4 % — AB (ref 4.0–5.6)

## 2019-02-05 NOTE — Assessment & Plan Note (Signed)
lipid control important in reducing the progression of atherosclerotic disease. Continue statin therapy  

## 2019-02-05 NOTE — Assessment & Plan Note (Signed)
Duplex velocities today are in the normal range and the elevated velocities previously may have been due to significant tortuosity.  No worrisome symptoms requiring further evaluation or treatment at this time.

## 2019-02-05 NOTE — Assessment & Plan Note (Signed)
blood pressure control important in reducing the progression of atherosclerotic disease. On appropriate oral medications.  

## 2019-02-05 NOTE — Assessment & Plan Note (Signed)
blood glucose control important in reducing the progression of atherosclerotic disease. Also, involved in wound healing. On appropriate medications.  

## 2019-02-05 NOTE — Progress Notes (Signed)
[facility] [place], [place] [address]  Internal MEDICINE  Office Visit Note  Patient Name: [name]  [id number]  [id number]  Date of Service: 02/07/2019  Chief Complaint  Patient presents with  . Diabetes  . Hypertension  . Abdominal Cramping    usually has regular bowel movements and sometimes he does not go at all even when he thinks he does     The patient is here for follow up visit. He states that he is having issues with frequent feelings of need to have bowel movement. He states this is happening nearly every time he eats. He states that sometimes, he can use the bathroom to urinate, and will feel the urge to have a bowel movement. Some of these times, he has a bowel movement, and other times he does not. He denies abdominal pain or cramping, constipation, or diarrhea. His last, normal bowel movement was yesterday.  Blood sugars are stable. Did see Dr. Lucky Cowboy recently. Abdominal and iliac aneurysms are stable and have not changed. Plan is to monitor yearly.       Current Medication: Outpatient Encounter Medications as of 02/05/2019  Medication Sig Note  . albuterol (VENTOLIN HFA) 108 (90 Base) MCG/ACT inhaler Inhale 2 puffs into the lungs every 4 (four) hours as needed for wheezing or shortness of breath. 12/22/2018: Caregiver reports using prn; used last this morning  . apixaban (ELIQUIS) 5 MG TABS tablet Take 2.5 mg by mouth 2 (two) times daily. Per [name] change to half a tablet twice daily   . atorvastatin (LIPITOR) 40 MG tablet Take 1 tablet (40 mg total) by mouth daily at 6 PM.   . carvedilol (COREG) 25 MG tablet Take 25 mg by mouth 2 (two) times daily with a meal.   . furosemide (LASIX) 20 MG tablet Take 1 tablet (20 mg total) by mouth daily.   Marland Kitchen glucose blood (ONETOUCH VERIO) test strip    . pantoprazole (PROTONIX) 40 MG tablet TAKE 1 TABLET(40 MG) BY MOUTH DAILY   . Semaglutide,0.25 or 0.5MG /DOS, (OZEMPIC, 0.25 OR 0.5 MG/DOSE,) 2  MG/1.5ML SOPN Inject 0.5 mg into the skin once a week. 12/11/2018: On tuesdays   No facility-administered encounter medications on file as of 02/05/2019.    Surgical History: Past Surgical History:  Procedure Laterality Date  . CATARACT EXTRACTION    . PROSTATE CANCER    . PROSTATE SURGERY      Medical History: Past Medical History:  Diagnosis Date  . Atrial fibrillation ([place])   . CHF (congestive heart failure) ([place])   . Diabetes ([place])   . Hearing loss   . High blood pressure   . History of bladder problems   . Kidney stones 11/22/2014    Family History: Family History  Problem Relation Age of Onset  . Colon cancer Mother   . Diabetes Other   . High blood pressure Other   . Prostate cancer Brother   . Diabetes Brother     Social History   Socioeconomic History  . Marital status: Married    Spouse name: Not on file  . Number of children: Not on file  . Years of education: Not on file  . Highest education level: Not on file  Occupational History  . Occupation: [employer]  Tobacco Use  . Smoking status: Former Smoker    Years: 16.00    Types: Cigarettes  . Smokeless tobacco: Never Used  Substance and Sexual Activity  . Alcohol use: No  .  Drug use: No  . Sexual activity: Not Currently  Other Topics Concern  . Not on file  Social History Narrative  . Not on file   Social Determinants of Health   Financial Resource Strain:   . Difficulty of Paying Living Expenses: Not on file  Food Insecurity:   . Worried About Charity fundraiser in the Last Year: Not on file  . Ran Out of Food in the Last Year: Not on file  Transportation Needs:   . Lack of Transportation (Medical): Not on file  . Lack of Transportation (Non-Medical): Not on file  Physical Activity: Inactive  . Days of Exercise per Week: 0 days  . Minutes of Exercise per Session: 0 min  Stress: No Stress Concern Present  . Feeling of Stress : Not at all  Social Connections: Unknown  . Frequency of  Communication with Friends and Family: Not on file  . Frequency of Social Gatherings with Friends and Family: Never  . Attends Religious Services: Not on file  . Active Member of Clubs or Organizations: Not on file  . Attends Archivist Meetings: Not on file  . Marital Status: Not on file  Intimate Partner Violence:   . Fear of Current or Ex-Partner: Not on file  . Emotionally Abused: Not on file  . Physically Abused: Not on file  . Sexually Abused: Not on file      Review of Systems  Constitutional: Negative for chills, fatigue and unexpected weight change.  HENT: Negative for congestion, postnasal drip, rhinorrhea, sneezing and sore throat.   Respiratory: Negative for cough, chest tightness, shortness of breath and wheezing.   Cardiovascular: Negative for chest pain and palpitations.  Gastrointestinal: Negative for abdominal pain, constipation, diarrhea, nausea and vomiting.       The patient feels as though he has to use the bathroom many times during the day, especially after he eats. Denies abdominal cramping, diarrhea, or constipation. Seems to think that milk and milk products do seem to make this even more significant.   Endocrine: Negative for cold intolerance, heat intolerance, polydipsia and polyuria.  Musculoskeletal: Negative for arthralgias, back pain, joint swelling and neck pain.  Skin: Negative for rash.  Neurological: Negative for dizziness, tremors, numbness and headaches.  Hematological: Negative for adenopathy. Does not bruise/bleed easily.  Psychiatric/Behavioral: Negative for behavioral problems (Depression), sleep disturbance and suicidal ideas. The patient is not nervous/anxious.     Today's Vitals   02/05/19 0934  BP: (!) 123/93  Pulse: 88  Resp: 16  SpO2: 97%  Weight: 247 lb 6.4 oz (112.2 kg)  Height: 6' (1.829 m)   Body mass index is 33.55 kg/m.  Physical Exam Vitals and nursing note reviewed.  Constitutional:      General: He is not  in acute distress.    Appearance: Normal appearance. He is well-developed. He is not diaphoretic.  HENT:     Head: Normocephalic and atraumatic.     Mouth/Throat:     Pharynx: No oropharyngeal exudate.  Eyes:     Pupils: Pupils are equal, round, and reactive to light.  Neck:     Thyroid: No thyromegaly.     Vascular: No carotid bruit or JVD.     Trachea: No tracheal deviation.  Cardiovascular:     Rate and Rhythm: Normal rate and regular rhythm.     Heart sounds: Normal heart sounds. No murmur. No friction rub. No gallop.   Pulmonary:     Effort: Pulmonary effort  is normal. No respiratory distress.     Breath sounds: Normal breath sounds. No wheezing or rales.  Chest:     Chest wall: No tenderness.  Abdominal:     General: Bowel sounds are normal.     Palpations: Abdomen is soft.     Tenderness: There is no abdominal tenderness.     Hernia: A hernia is present.  Musculoskeletal:        General: Normal range of motion.     Cervical back: Normal range of motion and neck supple.  Lymphadenopathy:     Cervical: No cervical adenopathy.  Skin:    General: Skin is warm and dry.  Neurological:     Mental Status: He is alert and oriented to person, place, and time.     Cranial Nerves: No cranial nerve deficit.  Psychiatric:        Mood and Affect: Mood normal.        Behavior: Behavior normal.        Thought Content: Thought content normal.        Judgment: Judgment normal.    Assessment/Plan: 1. Type 2 diabetes mellitus with hyperglycemia, without long-term current use of insulin (HCC) - POCT HgB A1C 7.4 today. Continue diabetic medication as prescribed   2. Diverticulosis of colon Researched dietary options to help control GI symptoms without adding medications. deit plan was printed off for the patient and his wife.   3. Abdominal aortic aneurysm without rupture [facility]) Reviewed recent progress notes from vein and vascular provider. Follow up as scheduled.   4. Chronic  a-fib (HCC) Continue medication as prescribed and regular visits with cardiology as scheduled.   5. Essential hypertension Stable. Continue bp medication as prescribed   General Counseling: dorr perrot understanding of the findings of todays visit and agrees with plan of treatment. I have discussed any further diagnostic evaluation that may be needed or ordered today. We also reviewed his medications today. he has been encouraged to call the office with any questions or concerns that should arise related to todays visit.  This patient was seen by Leretha Pol FNP Collaboration with Dr Lavera Guise as a part of collaborative care agreement  Orders Placed This Encounter  Procedures  . POCT HgB A1C    Total time spent: 30 minutes   Time spent includes review of chart, medications, test results, and follow up plan with the patient.      Dr Lavera Guise Internal medicine

## 2019-02-05 NOTE — Assessment & Plan Note (Signed)
On anticoagulation 

## 2019-02-05 NOTE — Assessment & Plan Note (Addendum)
Following annually. Just about 6 months ago and was a little over 3 cm.  Similar size today with a known 2.2 cm iliac aneurysm.  We can follow this annually

## 2019-02-05 NOTE — Progress Notes (Signed)
MRN : [id number]  [name] is a 86 y.o. (May 18, 1933) male who presents with chief complaint of  Chief Complaint  Patient presents with  . Follow-up    4mo Mesenteric  .  History of Present Illness: Patient returns today in follow up of his mesenteric disease.  He is doing well without abdominal pain, weight loss, or food fear. He has no complaints today. Specifically, the patient denies new back or abdominal pain, or signs of peripheral embolization. Duplex velocities today are in the normal range and the elevated velocities previously may have been due to significant tortuosity.   Current Outpatient Medications  Medication Sig Dispense Refill  . albuterol (VENTOLIN HFA) 108 (90 Base) MCG/ACT inhaler Inhale 2 puffs into the lungs every 4 (four) hours as needed for wheezing or shortness of breath. 18 g 0  . apixaban (ELIQUIS) 5 MG TABS tablet Take 2.5 mg by mouth 2 (two) times daily. Per [name] change to half a tablet twice daily    . atorvastatin (LIPITOR) 40 MG tablet Take 1 tablet (40 mg total) by mouth daily at 6 PM. 30 tablet 5  . carvedilol (COREG) 25 MG tablet Take 25 mg by mouth 2 (two) times daily with a meal.    . furosemide (LASIX) 20 MG tablet Take 1 tablet (20 mg total) by mouth daily. 90 tablet 3  . glucose blood (ONETOUCH VERIO) test strip     . pantoprazole (PROTONIX) 40 MG tablet TAKE 1 TABLET(40 MG) BY MOUTH DAILY 30 tablet 6  . Semaglutide,0.25 or 0.5MG /DOS, (OZEMPIC, 0.25 OR 0.5 MG/DOSE,) 2 MG/1.5ML SOPN Inject 0.5 mg into the skin once a week. 1 pen 5   No current facility-administered medications for this visit.    Past Medical History:  Diagnosis Date  . Atrial fibrillation ([place])   . CHF (congestive heart failure) ([place])   . Diabetes ([place])   . Hearing loss   . High blood pressure   . History of bladder problems   . Kidney stones 11/22/2014    Past Surgical History:  Procedure Laterality Date  . CATARACT EXTRACTION    . PROSTATE CANCER    .  PROSTATE SURGERY       Social History   Tobacco Use  . Smoking status: Former Smoker    Years: 16.00    Types: Cigarettes  . Smokeless tobacco: Never Used  Substance Use Topics  . Alcohol use: No  . Drug use: No    Family History  Problem Relation Age of Onset  . Colon cancer Mother   . Diabetes Other   . High blood pressure Other   . Prostate cancer Brother   . Diabetes Brother      No Known Allergies   REVIEW OF SYSTEMS (Negative unless checked) Review of Systems: Negative Unless Checked Constitutional: [] ?Weight loss[] ?Fever[] ?Chills Cardiac:[] ?Chest pain[x] ? Atrial Fibrillation[] ?Palpitations [] ?Shortness of breath when laying flat [] ?Shortness of breath with exertion. [] ?Shortness of breath at rest Vascular: [] ?Pain in legs with walking[] ?Pain in legswith standing[] ?Pain in legs when laying flat [] ?Claudication  [] ?Pain in feet when laying flat [] ?History of DVT [] ?Phlebitis [] ?Swelling in legs [] ?Varicose veins [] ?Non-healing ulcers Pulmonary: [] ?Uses home oxygen [] ?Productive cough[] ?Hemoptysis [] ?Wheeze [] ?COPD [] ?Asthma Neurologic: [] ?Dizziness[] ?Seizures [] ?Blackouts[] ?History of stroke [] ?History of TIA[] ?Aphasia [] ?Temporary Blindness[] ?Weaknessor numbness in arm [] ?Weakness or numbnessin leg Musculoskeletal:[] ?Joint swelling [] ?Joint pain [] ?Low back pain  [] ? History of Knee Replacement [] ?Arthritis [] ?back Surgeries[] ? Spinal Stenosis  Hematologic:[] ?Easy bruising[] ?Easy bleeding [] ?Hypercoagulable state [x] ?Anemic Gastrointestinal:[] ?Diarrhea [] ?Vomiting[] ?Gastroesophageal reflux/heartburn[] ?Difficulty swallowing. [] ?Abdominal pain Genitourinary: [x] ?Chronic  kidney disease [] ?Difficulturination [] ?Anuric[] ?Blood in urine [] ?Frequenturination [] ?Burning with urination[] ?Hematuria Skin: [] ?Rashes [] ?Ulcers [] ?Wounds Psychological: [] ?History of  anxiety[] ?History of major depression  [] ? Memory Difficulties  Physical Examination  BP 138/86 (BP Location: Right Arm)   Pulse (!) 120   Resp 12   Ht 6' (1.829 m)   Wt 246 lb (111.6 kg)   BMI 33.36 kg/m  Gen:  WD/WN, NAD Head: [place]/AT, No temporalis wasting. Ear/Nose/Throat: Hearing grossly intact, nares w/o erythema or drainage Eyes: Conjunctiva clear. Sclera non-icteric Neck: Supple.  Trachea midline Pulmonary:  Good air movement, no use of accessory muscles.  Cardiac: RRR, no JVD Vascular:  Vessel Right Left  Radial Palpable Palpable                                   Gastrointestinal: soft, non-tender/non-distended. No guarding/reflex.  Musculoskeletal: M/S 5/5 throughout.  No deformity or atrophy. No edema. Neurologic: Sensation grossly intact in extremities.  Symmetrical.  Speech is fluent.  Psychiatric: Judgment intact, Mood & affect appropriate for pt's clinical situation. Dermatologic: No rashes or ulcers noted.  No cellulitis or open wounds.       Labs Recent Results (from the past 2160 hour(s))  Basic metabolic panel     Status: Abnormal   Collection Time: 12/10/18  6:19 PM  Result Value Ref Range   Sodium 140 135 - 145 mmol/L   Potassium 4.1 3.5 - 5.1 mmol/L   Chloride 108 98 - 111 mmol/L   CO2 23 22 - 32 mmol/L   Glucose, Bld 104 (H) 70 - 99 mg/dL   BUN 39 (H) 8 - 23 mg/dL   Creatinine, Ser 2.22 (H) 0.61 - 1.24 mg/dL   Calcium 9.4 8.9 - 10.3 mg/dL   GFR calc non Af Amer 26 (L) >60 mL/min   GFR calc Af Amer 31 (L) >60 mL/min   Anion gap 9 5 - 15    Comment: Performed at [facility], [address] [place], [place] [address]  CBC     Status: Abnormal   Collection Time: 12/10/18  6:19 PM  Result Value Ref Range   WBC 5.8 4.0 - 10.5 K/uL   RBC 4.71 4.22 - 5.81 MIL/uL   Hemoglobin 13.6 13.0 - 17.0 g/dL   HCT 43.0 39.0 - 52.0 %   MCV 91.3 80.0 - 100.0 fL   MCH 28.9 26.0 - 34.0 pg   MCHC 31.6 30.0 - 36.0 g/dL   RDW 15.3 11.5 -  15.5 %   Platelets 149 (L) 150 - 400 K/uL   nRBC 0.0 0.0 - 0.2 %    Comment: Performed at [facility], [place], [place] [address]  Troponin I (High Sensitivity)     Status: Abnormal   Collection Time: 12/10/18  6:19 PM  Result Value Ref Range   Troponin I (High Sensitivity) 18 (H) <18 ng/L    Comment: (NOTE) Elevated high sensitivity troponin I (hsTnI) values and significant  changes across serial measurements may suggest ACS but many other  chronic and acute conditions are known to elevate hsTnI results.  Refer to the "Links" section for chest pain algorithms and additional  guidance. Performed at [facility], [address] [place], [place] [address]   Protime-INR     Status: None   Collection Time: 12/10/18  6:41 PM  Result Value Ref Range   Prothrombin Time 14.3 11.4 - 15.2 seconds  INR 1.1 0.8 - 1.2    Comment: (NOTE) INR goal varies based on device and disease states. Performed at [facility], [address] [place], [place] [address]   APTT     Status: None   Collection Time: 12/10/18  6:41 PM  Result Value Ref Range   aPTT 30 24 - 36 seconds    Comment: Performed at [facility], [place], [place] [address]  Troponin I (High Sensitivity)     Status: Abnormal   Collection Time: 12/10/18 10:09 PM  Result Value Ref Range   Troponin I (High Sensitivity) 18 (H) <18 ng/L    Comment: (NOTE) Elevated high sensitivity troponin I (hsTnI) values and significant  changes across serial measurements may suggest ACS but many other  chronic and acute conditions are known to elevate hsTnI results.  Refer to the "Links" section for chest pain algorithms and additional  guidance. Performed at [facility], [address] [place], [place] [address]   POC SARS Coronavirus 2 Ag     Status: None   Collection Time: 12/11/18  2:01 AM  Result Value Ref Range   SARS Coronavirus 2 Ag NEGATIVE NEGATIVE     Comment: (NOTE) SARS-CoV-2 antigen NOT DETECTED.  Negative results are presumptive.  Negative results do not preclude SARS-CoV-2 infection and should not be used as the sole basis for treatment or other patient management decisions, including infection  control decisions, particularly in the presence of clinical signs and  symptoms consistent with COVID-19, or in those who have been in contact with the virus.  Negative results must be combined with clinical observations, patient history, and epidemiological information. The expected result is Negative. Fact Sheet for Patients: PodPark.tn Fact Sheet for Healthcare Providers: GiftContent.is This test is not yet approved or cleared by the [place] FDA and  has been authorized for detection and/or diagnosis of SARS-CoV-2 by FDA under an Emergency Use Authorization (EUA).  This EUA will remain in effect (meaning this test can be used) for the duration of  the COVID-19 de claration under Section 564(b)(1) of the Act, 21 U.S.C. section 360bbb-3(b)(1), unless the authorization is terminated or revoked sooner.   SARS CORONAVIRUS 2 (TAT 6-24 HRS) Nasopharyngeal Nasopharyngeal Swab     Status: None   Collection Time: 12/11/18  3:58 AM   Specimen: Nasopharyngeal Swab  Result Value Ref Range   SARS Coronavirus 2 NEGATIVE NEGATIVE    Comment: (NOTE) SARS-CoV-2 target nucleic acids are NOT DETECTED. The SARS-CoV-2 RNA is generally detectable in upper and lower respiratory specimens during the acute phase of infection. Negative results do not preclude SARS-CoV-2 infection, do not rule out co-infections with other pathogens, and should not be used as the sole basis for treatment or other patient management decisions. Negative results must be combined with clinical observations, patient history, and epidemiological information. The expected result is Negative. Fact Sheet for  Patients: SugarRoll.be Fact Sheet for Healthcare Providers: https://www.woods-mathews.com/ This test is not yet approved or cleared by the [place] FDA and  has been authorized for detection and/or diagnosis of SARS-CoV-2 by FDA under an Emergency Use Authorization (EUA). This EUA will remain  in effect (meaning this test can be used) for the duration of the COVID-19 declaration under Section 56 4(b)(1) of the Act, 21 U.S.C. section 360bbb-3(b)(1), unless the authorization is terminated or revoked sooner. Performed at [facility], [place] [address] [place], [place] [address]   Brain natriuretic peptide  Status: Abnormal   Collection Time: 12/11/18  3:58 AM  Result Value Ref Range   B Natriuretic Peptide 430.0 (H) 0.0 - 100.0 pg/mL    Comment: Performed at [facility], [address] [place], [place] [address]  Hemoglobin A1c     Status: Abnormal   Collection Time: 12/11/18  5:29 AM  Result Value Ref Range   Hgb A1c MFr Bld 7.5 (H) 4.8 - 5.6 %    Comment: (NOTE) Pre diabetes:          5.7%-6.4% Diabetes:              >6.4% Glycemic control for   <7.0% adults with diabetes    Mean Plasma Glucose 168.55 mg/dL    Comment: Performed at [facility] [address], [place], [place] [address]  TSH     Status: None   Collection Time: 12/11/18  5:29 AM  Result Value Ref Range   TSH 0.751 0.350 - 4.500 uIU/mL    Comment: Performed by a 3rd Generation assay with a functional sensitivity of <=0.01 uIU/mL. Performed at [facility], [address] [place], [place] [address]   Magnesium     Status: None   Collection Time: 12/11/18  5:29 AM  Result Value Ref Range   Magnesium 1.9 1.7 - 2.4 mg/dL    Comment: Performed at [facility], [place], [place] [address]  Troponin I (High Sensitivity)     Status: None   Collection Time: 12/11/18  8:14 AM  Result Value Ref Range   Troponin  I (High Sensitivity) 14 <18 ng/L    Comment: (NOTE) Elevated high sensitivity troponin I (hsTnI) values and significant  changes across serial measurements may suggest ACS but many other  chronic and acute conditions are known to elevate hsTnI results.  Refer to the "Links" section for chest pain algorithms and additional  guidance. Performed at [facility], [address] [place], [place] [address]   Glucose, capillary     Status: Abnormal   Collection Time: 12/11/18  8:17 AM  Result Value Ref Range   Glucose-Capillary 229 (H) 70 - 99 mg/dL   Comment 1 Notify RN   Troponin I (High Sensitivity)     Status: None   Collection Time: 12/11/18 11:06 AM  Result Value Ref Range   Troponin I (High Sensitivity) 15 <18 ng/L    Comment: (NOTE) Elevated high sensitivity troponin I (hsTnI) values and significant  changes across serial measurements may suggest ACS but many other  chronic and acute conditions are known to elevate hsTnI results.  Refer to the "Links" section for chest pain algorithms and additional  guidance. Performed at [facility], [address] [place],  [address]   Glucose, capillary     Status: Abnormal   Collection Time: 12/11/18  1:15 PM  Result Value Ref Range   Glucose-Capillary 176 (H) 70 - 99 mg/dL  ECHOCARDIOGRAM COMPLETE     Status: None   Collection Time: 12/11/18  2:14 PM  Result Value Ref Range   Weight 4,000.03 oz   Height 72 in   BP 125/76 mmHg  Glucose, capillary     Status: Abnormal   Collection Time: 12/11/18  4:15 PM  Result Value Ref Range   Glucose-Capillary 173 (H) 70 - 99 mg/dL  Glucose, capillary     Status: Abnormal   Collection Time: 12/11/18  9:05 PM  Result Value Ref Range   Glucose-Capillary  211 (H) 70 - 99 mg/dL  Basic metabolic panel     Status: Abnormal   Collection Time: 12/12/18  4:15 AM  Result Value Ref Range   Sodium 139 135 - 145 mmol/L   Potassium 4.1 3.5 - 5.1 mmol/L   Chloride 102 98 - 111  mmol/L   CO2 25 22 - 32 mmol/L   Glucose, Bld 159 (H) 70 - 99 mg/dL   BUN 40 (H) 8 - 23 mg/dL   Creatinine, Ser 1.94 (H) 0.61 - 1.24 mg/dL   Calcium 9.4 8.9 - 10.3 mg/dL   GFR calc non Af Amer 31 (L) >60 mL/min   GFR calc Af Amer 36 (L) >60 mL/min   Anion gap 12 5 - 15    Comment: Performed at [facility], [address] [place], [place] [address]  Glucose, capillary     Status: Abnormal   Collection Time: 12/12/18  8:11 AM  Result Value Ref Range   Glucose-Capillary 170 (H) 70 - 99 mg/dL   Comment 1 Notify RN    Comment 2 Document in Chart   Glucose, capillary     Status: Abnormal   Collection Time: 12/12/18 11:24 AM  Result Value Ref Range   Glucose-Capillary 120 (H) 70 - 99 mg/dL   Comment 1 Notify RN    Comment 2 Document in Chart   POCT Glucose (CBG)     Status: Abnormal   Collection Time: 12/15/18 10:06 AM  Result Value Ref Range   POC Glucose 211 (A) 70 - 99 mg/dl    Comment: pt had oatmeal for breakfast, checked prior to eating and level was 162    Radiology No results found.  Assessment/Plan  Paroxysmal A-fib (HCC) On anticoagulation.  Essential hypertension blood pressure control important in reducing the progression of atherosclerotic disease. On appropriate oral medications.   Hyperlipidemia, mixed lipid control important in reducing the progression of atherosclerotic disease. Continue statin therapy   Type 2 diabetes mellitus with stage 3 chronic kidney disease, without long-term current use of insulin (HCC) blood glucose control important in reducing the progression of atherosclerotic disease. Also, involved in wound healing. On appropriate medications.   Abdominal aortic aneurysm without rupture ([place]) Following annually. Just about 6 months ago and was a little over 3 cm.  Similar size today with a known 2.2 cm iliac aneurysm.  We can follow this annually  Chronic mesenteric ischemia (HCC) Duplex velocities today are in the normal range  and the elevated velocities previously may have been due to significant tortuosity.  No worrisome symptoms requiring further evaluation or treatment at this time.    Leotis Pain, MD  02/05/2019 9:32 AM    This note was created with Dragon medical transcription system.  Any errors from dictation are purely unintentional

## 2019-02-07 DIAGNOSIS — K5730 Diverticulosis of large intestine without perforation or abscess without bleeding: Secondary | ICD-10-CM | POA: Insufficient documentation

## 2019-02-19 ENCOUNTER — Ambulatory Visit: Payer: Medicare Other | Admitting: Family

## 2019-02-19 ENCOUNTER — Other Ambulatory Visit: Payer: Medicare Other

## 2019-02-20 DIAGNOSIS — G4733 Obstructive sleep apnea (adult) (pediatric): Secondary | ICD-10-CM | POA: Diagnosis not present

## 2019-02-26 ENCOUNTER — Ambulatory Visit: Payer: Medicare Other | Admitting: Nurse Practitioner

## 2019-03-11 ENCOUNTER — Ambulatory Visit: Payer: Medicare Other | Admitting: Podiatry

## 2019-03-15 ENCOUNTER — Ambulatory Visit: Payer: Medicare Other | Admitting: Podiatry

## 2019-03-15 ENCOUNTER — Other Ambulatory Visit: Payer: Self-pay

## 2019-03-15 ENCOUNTER — Encounter: Payer: Self-pay | Admitting: Podiatry

## 2019-03-15 DIAGNOSIS — B351 Tinea unguium: Secondary | ICD-10-CM | POA: Diagnosis not present

## 2019-03-15 DIAGNOSIS — M79676 Pain in unspecified toe(s): Secondary | ICD-10-CM

## 2019-03-15 DIAGNOSIS — E119 Type 2 diabetes mellitus without complications: Secondary | ICD-10-CM | POA: Diagnosis not present

## 2019-03-15 MED ORDER — ONETOUCH VERIO VI STRP: ORAL_STRIP | 3 refills | Status: DC

## 2019-03-15 NOTE — Progress Notes (Signed)
Complaint:  Visit Type: Patient returns to my office for continued preventative foot care services. Complaint: Patient states" my nails have grown long and thick and become painful to walk and wear shoes" Patient has been diagnosed with DM with no foot complications. The patient presents for preventative foot care services. No changes to ROS.    Podiatric Exam: Vascular: dorsalis pedis and posterior tibial pulses are weakly  palpable bilateral. Capillary return is immediate. Temperature gradient is WNL. Skin turgor WNL  Sensorium: Normal Semmes Weinstein monofilament test. Normal tactile sensation bilaterally. Nail Exam: Pt has thick disfigured discolored nails with subungual debris noted bilateral entire nail hallux through fifth toenails Ulcer Exam: There is no evidence of ulcer or pre-ulcerative changes or infection. Orthopedic Exam: Muscle tone and strength are WNL. No limitations in general ROM. No crepitus or effusions noted. Foot type and digits show no abnormalities. Bony prominences are unremarkable. Skin:  Porokeratosis sub 3 right asymptomatic..  No infection noted. No infection or ulcers  Diagnosis:  Onychomycosis, , Pain in right toe, pain in left toes  Porokeratosis  Treatment & Plan Procedures and Treatment: Consent by patient was obtained for treatment procedures. The patient understood the discussion of treatment and procedures well. All questions were answered thoroughly reviewed. Debridement of mycotic and hypertrophic toenails, 1 through 5 bilateral and clearing of subungual debris. No ulceration, no infection noted.     Return Visit-Office Procedure: Patient instructed to return to the office for a follow up visit 10 weeks  for continued evaluation and treatment.    Franceen Erisman DPM 

## 2019-03-21 DIAGNOSIS — G4733 Obstructive sleep apnea (adult) (pediatric): Secondary | ICD-10-CM | POA: Diagnosis not present

## 2019-03-23 ENCOUNTER — Other Ambulatory Visit: Payer: Self-pay

## 2019-03-23 MED ORDER — PANTOPRAZOLE SODIUM 40 MG PO TBEC: DELAYED_RELEASE_TABLET | ORAL | 6 refills | Status: DC

## 2019-04-09 DIAGNOSIS — I10 Essential (primary) hypertension: Secondary | ICD-10-CM | POA: Diagnosis not present

## 2019-04-09 DIAGNOSIS — E782 Mixed hyperlipidemia: Secondary | ICD-10-CM | POA: Diagnosis not present

## 2019-04-09 DIAGNOSIS — I2510 Atherosclerotic heart disease of native coronary artery without angina pectoris: Secondary | ICD-10-CM | POA: Diagnosis not present

## 2019-04-09 DIAGNOSIS — I5022 Chronic systolic (congestive) heart failure: Secondary | ICD-10-CM | POA: Diagnosis not present

## 2019-04-09 DIAGNOSIS — I482 Chronic atrial fibrillation, unspecified: Secondary | ICD-10-CM | POA: Diagnosis not present

## 2019-05-05 ENCOUNTER — Telehealth: Payer: Self-pay

## 2019-05-05 NOTE — Telephone Encounter (Signed)
Confirmed and screened for 05-07-19 ov with patient spouse.

## 2019-05-07 ENCOUNTER — Ambulatory Visit: Payer: Medicare Other | Admitting: Nurse Practitioner

## 2019-05-12 ENCOUNTER — Other Ambulatory Visit: Payer: Self-pay

## 2019-05-12 DIAGNOSIS — E1165 Type 2 diabetes mellitus with hyperglycemia: Secondary | ICD-10-CM

## 2019-05-12 MED ORDER — OZEMPIC (0.25 OR 0.5 MG/DOSE) 2 MG/1.5ML ~~LOC~~ SOPN: 0.5000 mg | PEN_INJECTOR | SUBCUTANEOUS | 5 refills | Status: DC

## 2019-05-17 ENCOUNTER — Other Ambulatory Visit: Payer: Self-pay

## 2019-05-17 MED ORDER — ATORVASTATIN CALCIUM 40 MG PO TABS: 40.0000 mg | ORAL_TABLET | Freq: Every day | ORAL | 5 refills | Status: DC

## 2019-05-18 ENCOUNTER — Telehealth: Payer: Self-pay

## 2019-05-18 NOTE — Telephone Encounter (Signed)
Pt wife advised sample for ozempic and also form for patient assistance program

## 2019-05-24 DIAGNOSIS — E782 Mixed hyperlipidemia: Secondary | ICD-10-CM | POA: Diagnosis not present

## 2019-05-24 DIAGNOSIS — I482 Chronic atrial fibrillation, unspecified: Secondary | ICD-10-CM | POA: Diagnosis not present

## 2019-05-24 DIAGNOSIS — N1832 Chronic kidney disease, stage 3b: Secondary | ICD-10-CM | POA: Diagnosis not present

## 2019-05-24 DIAGNOSIS — I5043 Acute on chronic combined systolic (congestive) and diastolic (congestive) heart failure: Secondary | ICD-10-CM | POA: Diagnosis not present

## 2019-05-24 DIAGNOSIS — I10 Essential (primary) hypertension: Secondary | ICD-10-CM | POA: Diagnosis not present

## 2019-06-02 ENCOUNTER — Telehealth: Payer: Self-pay

## 2019-06-02 NOTE — Telephone Encounter (Signed)
Confirmed and screened for 06-04-19. Patient does have  A little cough did advise if any other symptoms started prior to appt then to give our office a call. Was advised to wear a facemask. Said no to all other screening questions.

## 2019-06-04 ENCOUNTER — Ambulatory Visit (INDEPENDENT_AMBULATORY_CARE_PROVIDER_SITE_OTHER): Payer: Medicare Other | Admitting: Nurse Practitioner

## 2019-06-04 ENCOUNTER — Encounter: Payer: Self-pay | Admitting: Nurse Practitioner

## 2019-06-04 VITALS — BP 111/57 | HR 56 | Temp 95.6°F | Resp 16 | Ht 72.0 in | Wt 241.0 lb

## 2019-06-04 DIAGNOSIS — E1165 Type 2 diabetes mellitus with hyperglycemia: Secondary | ICD-10-CM

## 2019-06-04 DIAGNOSIS — I4891 Unspecified atrial fibrillation: Secondary | ICD-10-CM

## 2019-06-04 DIAGNOSIS — I10 Essential (primary) hypertension: Secondary | ICD-10-CM | POA: Diagnosis not present

## 2019-06-04 LAB — POCT GLYCOSYLATED HEMOGLOBIN (HGB A1C): Hemoglobin A1C: 6.8 % — AB (ref 4.0–5.6)

## 2019-06-04 NOTE — Progress Notes (Signed)
[facility] [place],  [address]  Internal MEDICINE  Office Visit Note  Patient Name: [name]  [id number]  [id number]  Date of Service: 06/09/2019  Chief Complaint  Patient presents with  . Follow-up  . Diabetes  . Hypertension    The patient is here for routine follow up visit. Blood sugars are doing well. Improved from last visit. HgbA1c is 6.8 today. Blood pressure is good.  He is reluctant to get vaccine for COVID 19. Discussed risks versus the benefits of taking the vaccine. He states he will consider these factors before making a final decision. He recently saw his cardiologist about two weeks ago. No medication changes were made and he will follow up with cardiology in a few months.       Current Medication: Outpatient Encounter Medications as of 06/04/2019  Medication Sig Note  . albuterol (VENTOLIN HFA) 108 (90 Base) MCG/ACT inhaler Inhale 2 puffs into the lungs every 4 (four) hours as needed for wheezing or shortness of breath. 12/22/2018: Caregiver reports using prn; used last this morning  . atorvastatin (LIPITOR) 40 MG tablet Take 1 tablet (40 mg total) by mouth daily at 6 PM.   . carvedilol (COREG) 25 MG tablet Take by mouth.   Arne Cleveland 2.5 MG TABS tablet Take 2.5 mg by mouth 2 (two) times daily.   . furosemide (LASIX) 20 MG tablet Take 1 tablet (20 mg total) by mouth daily.   Marland Kitchen glucose blood (ONETOUCH VERIO) test strip USE ONCE DAILY. DX E11.65.   . pantoprazole (PROTONIX) 40 MG tablet TAKE 1 TABLET(40 MG) BY MOUTH DAILY   . Semaglutide,0.25 or 0.5MG /DOS, (OZEMPIC, 0.25 OR 0.5 MG/DOSE,) 2 MG/1.5ML SOPN Inject 0.5 mg into the skin once a week.    No facility-administered encounter medications on file as of 06/04/2019.    Surgical History: Past Surgical History:  Procedure Laterality Date  . CATARACT EXTRACTION    . PROSTATE CANCER    . PROSTATE SURGERY      Medical History: Past Medical History:  Diagnosis Date  .  Atrial fibrillation ([place])   . CHF (congestive heart failure) ([place])   . Diabetes ([place])   . Hearing loss   . High blood pressure   . History of bladder problems   . Kidney stones 11/22/2014    Family History: Family History  Problem Relation Age of Onset  . Colon cancer Mother   . Diabetes Other   . High blood pressure Other   . Prostate cancer Brother   . Diabetes Brother     Social History   Socioeconomic History  . Marital status: Married    Spouse name: Not on file  . Number of children: Not on file  . Years of education: Not on file  . Highest education level: Not on file  Occupational History  . Occupation: [employer]  Tobacco Use  . Smoking status: Former Smoker    Years: 16.00    Types: Cigarettes  . Smokeless tobacco: Never Used  Substance and Sexual Activity  . Alcohol use: No  . Drug use: No  . Sexual activity: Not Currently  Other Topics Concern  . Not on file  Social History Narrative  . Not on file   Social Determinants of Health   Financial Resource Strain:   . Difficulty of Paying Living Expenses:   Food Insecurity:   . Worried About Charity fundraiser in the Last Year:   . [employer]  of Food in the Last Year:   Transportation Needs:   . Film/video editor (Medical):   Marland Kitchen Lack of Transportation (Non-Medical):   Physical Activity:   . Days of Exercise per Week:   . Minutes of Exercise per Session:   Stress:   . Feeling of Stress :   Social Connections:   . Frequency of Communication with Friends and Family:   . Frequency of Social Gatherings with Friends and Family:   . Attends Religious Services:   . Active Member of Clubs or Organizations:   . Attends Archivist Meetings:   Marland Kitchen Marital Status:   Intimate Partner Violence:   . Fear of Current or Ex-Partner:   . Emotionally Abused:   Marland Kitchen Physically Abused:   . Sexually Abused:       Review of Systems  Constitutional: Negative for chills, fatigue and unexpected weight change.   HENT: Negative for congestion, postnasal drip, rhinorrhea, sneezing and sore throat.   Respiratory: Negative for cough, chest tightness, shortness of breath and wheezing.   Cardiovascular: Negative for chest pain and palpitations.  Gastrointestinal: Negative for abdominal pain, constipation, diarrhea, nausea and vomiting.       The patient feels as though he has to use the bathroom many times during the day, especially after he eats. Denies abdominal cramping, diarrhea, or constipation. Seems to think that milk and milk products do seem to make this even more significant. He has not cut back use of dairy products at this time.   Endocrine: Negative for cold intolerance, heat intolerance, polydipsia and polyuria.       Blood sugars doing well   Musculoskeletal: Negative for arthralgias, back pain, joint swelling and neck pain.  Skin: Negative for rash.  Neurological: Negative for dizziness, tremors, numbness and headaches.  Hematological: Negative for adenopathy. Does not bruise/bleed easily.  Psychiatric/Behavioral: Negative for behavioral problems (Depression), sleep disturbance and suicidal ideas. The patient is not nervous/anxious.     Today's Vitals   06/04/19 0916  BP: (!) 111/57  Pulse: (!) 56  Resp: 16  Temp: (!) 95.6 F (35.3 C)  SpO2: 92%  Weight: 241 lb (109.3 kg)  Height: 6' (1.829 m)   Body mass index is 32.69 kg/m.  Physical Exam Vitals and nursing note reviewed.  Constitutional:      General: He is not in acute distress.    Appearance: Normal appearance. He is well-developed. He is not diaphoretic.  HENT:     Head: Normocephalic and atraumatic.     Mouth/Throat:     Pharynx: No oropharyngeal exudate.  Eyes:     Pupils: Pupils are equal, round, and reactive to light.  Neck:     Thyroid: No thyromegaly.     Vascular: No carotid bruit or JVD.     Trachea: No tracheal deviation.  Cardiovascular:     Rate and Rhythm: Normal rate and regular rhythm.     Heart  sounds: Normal heart sounds. No murmur. No friction rub. No gallop.   Pulmonary:     Effort: Pulmonary effort is normal. No respiratory distress.     Breath sounds: Normal breath sounds. No wheezing or rales.  Chest:     Chest wall: No tenderness.  Abdominal:     Palpations: Abdomen is soft.  Musculoskeletal:        General: Normal range of motion.     Cervical back: Normal range of motion and neck supple.  Lymphadenopathy:     Cervical: No cervical  adenopathy.  Skin:    General: Skin is warm and dry.  Neurological:     Mental Status: He is alert and oriented to person, place, and time. Mental status is at baseline.     Cranial Nerves: No cranial nerve deficit.  Psychiatric:        Mood and Affect: Mood normal.        Behavior: Behavior normal.        Thought Content: Thought content normal.        Judgment: Judgment normal.   Assessment/Plan: 1. Type 2 diabetes mellitus with hyperglycemia, without long-term current use of insulin (HCC) - POCT HgB A1C 6.8 today. Continue ozempic 0.5mg  weekly. Monitor blood sugars regularly.   2. Essential hypertension Stable. Continue bp medication as prescribed   3. Atrial fibrillation with RVR ([place]) continue regular visits with cardiology as scheduled.   General Counseling: prestin munch understanding of the findings of todays visit and agrees with plan of treatment. I have discussed any further diagnostic evaluation that may be needed or ordered today. We also reviewed his medications today. he has been encouraged to call the office with any questions or concerns that should arise related to todays visit.  This patient was seen by Leretha Pol FNP Collaboration with Dr Lavera Guise as a part of collaborative care agreement  Orders Placed This Encounter  Procedures  . POCT HgB A1C      Total time spent: 30 Minutes   Time spent includes review of chart, medications, test results, and follow up plan with the patient.      Dr  Lavera Guise Internal medicine

## 2019-06-11 ENCOUNTER — Other Ambulatory Visit: Payer: Self-pay | Admitting: Nurse Practitioner

## 2019-06-11 DIAGNOSIS — I10 Essential (primary) hypertension: Secondary | ICD-10-CM | POA: Diagnosis not present

## 2019-06-11 DIAGNOSIS — R944 Abnormal results of kidney function studies: Secondary | ICD-10-CM | POA: Diagnosis not present

## 2019-06-11 DIAGNOSIS — Z0001 Encounter for general adult medical examination with abnormal findings: Secondary | ICD-10-CM | POA: Diagnosis not present

## 2019-06-12 LAB — COMPREHENSIVE METABOLIC PANEL
ALT: 41 IU/L (ref 0–44)
AST: 34 IU/L (ref 0–40)
Albumin/Globulin Ratio: 1.5 (ref 1.2–2.2)
Albumin: 4.4 g/dL (ref 3.6–4.6)
Alkaline Phosphatase: 124 IU/L — ABNORMAL HIGH (ref 48–121)
BUN/Creatinine Ratio: 15 (ref 10–24)
BUN: 37 mg/dL — ABNORMAL HIGH (ref 8–27)
Bilirubin Total: 0.6 mg/dL (ref 0.0–1.2)
CO2: 27 mmol/L (ref 20–29)
Calcium: 9.8 mg/dL (ref 8.6–10.2)
Chloride: 106 mmol/L (ref 96–106)
Creatinine, Ser: 2.4 mg/dL — ABNORMAL HIGH (ref 0.76–1.27)
GFR calc Af Amer: 28 mL/min/{1.73_m2} — ABNORMAL LOW (ref >59)
GFR calc non Af Amer: 24 mL/min/{1.73_m2} — ABNORMAL LOW (ref >59)
Globulin, Total: 2.9 g/dL (ref 1.5–4.5)
Glucose: 121 mg/dL — ABNORMAL HIGH (ref 65–99)
Potassium: 4.8 mmol/L (ref 3.5–5.2)
Sodium: 144 mmol/L (ref 134–144)
Total Protein: 7.3 g/dL (ref 6.0–8.5)

## 2019-06-12 LAB — TSH: TSH: 1.71 u[IU]/mL (ref 0.450–4.500)

## 2019-06-12 LAB — T4, FREE: Free T4: 1.01 ng/dL (ref 0.82–1.77)

## 2019-06-12 LAB — CBC
Hematocrit: 46 % (ref 37.5–51.0)
Hemoglobin: 14.6 g/dL (ref 13.0–17.7)
MCH: 29.7 pg (ref 26.6–33.0)
MCHC: 31.7 g/dL (ref 31.5–35.7)
MCV: 94 fL (ref 79–97)
Platelets: 154 10*3/uL (ref 150–450)
RBC: 4.91 x10E6/uL (ref 4.14–5.80)
RDW: 13.3 % (ref 11.6–15.4)
WBC: 4.6 10*3/uL (ref 3.4–10.8)

## 2019-06-12 LAB — LIPID PANEL WITH LDL/HDL RATIO
Cholesterol, Total: 97 mg/dL — ABNORMAL LOW (ref 100–199)
HDL: 47 mg/dL (ref >39)
LDL Chol Calc (NIH): 34 mg/dL (ref 0–99)
LDL/HDL Ratio: 0.7 ratio (ref 0.0–3.6)
Triglycerides: 74 mg/dL (ref 0–149)
VLDL Cholesterol Cal: 16 mg/dL (ref 5–40)

## 2019-06-12 LAB — PSA: Prostate Specific Ag, Serum: 47.6 ng/mL — ABNORMAL HIGH (ref 0.0–4.0)

## 2019-06-17 ENCOUNTER — Ambulatory Visit: Payer: Medicare Other | Admitting: Podiatry

## 2019-06-19 ENCOUNTER — Emergency Department: Payer: Medicare Other

## 2019-06-19 ENCOUNTER — Other Ambulatory Visit: Payer: Self-pay

## 2019-06-19 DIAGNOSIS — I4510 Unspecified right bundle-branch block: Secondary | ICD-10-CM | POA: Diagnosis not present

## 2019-06-19 DIAGNOSIS — Z7901 Long term (current) use of anticoagulants: Secondary | ICD-10-CM | POA: Insufficient documentation

## 2019-06-19 DIAGNOSIS — Z79899 Other long term (current) drug therapy: Secondary | ICD-10-CM | POA: Insufficient documentation

## 2019-06-19 DIAGNOSIS — I5022 Chronic systolic (congestive) heart failure: Secondary | ICD-10-CM | POA: Insufficient documentation

## 2019-06-19 DIAGNOSIS — E1122 Type 2 diabetes mellitus with diabetic chronic kidney disease: Secondary | ICD-10-CM | POA: Insufficient documentation

## 2019-06-19 DIAGNOSIS — I130 Hypertensive heart and chronic kidney disease with heart failure and stage 1 through stage 4 chronic kidney disease, or unspecified chronic kidney disease: Secondary | ICD-10-CM | POA: Insufficient documentation

## 2019-06-19 DIAGNOSIS — Z743 Need for continuous supervision: Secondary | ICD-10-CM | POA: Diagnosis not present

## 2019-06-19 DIAGNOSIS — I4891 Unspecified atrial fibrillation: Secondary | ICD-10-CM | POA: Diagnosis not present

## 2019-06-19 DIAGNOSIS — N183 Chronic kidney disease, stage 3 unspecified: Secondary | ICD-10-CM | POA: Insufficient documentation

## 2019-06-19 DIAGNOSIS — I509 Heart failure, unspecified: Secondary | ICD-10-CM | POA: Diagnosis not present

## 2019-06-19 DIAGNOSIS — R0789 Other chest pain: Secondary | ICD-10-CM | POA: Insufficient documentation

## 2019-06-19 DIAGNOSIS — Z87891 Personal history of nicotine dependence: Secondary | ICD-10-CM | POA: Insufficient documentation

## 2019-06-19 DIAGNOSIS — Z7984 Long term (current) use of oral hypoglycemic drugs: Secondary | ICD-10-CM | POA: Diagnosis not present

## 2019-06-19 DIAGNOSIS — I110 Hypertensive heart disease with heart failure: Secondary | ICD-10-CM | POA: Diagnosis not present

## 2019-06-19 DIAGNOSIS — R079 Chest pain, unspecified: Secondary | ICD-10-CM | POA: Diagnosis not present

## 2019-06-19 DIAGNOSIS — J439 Emphysema, unspecified: Secondary | ICD-10-CM | POA: Diagnosis not present

## 2019-06-19 LAB — CBC
HCT: 44.7 % (ref 39.0–52.0)
Hemoglobin: 14.1 g/dL (ref 13.0–17.0)
MCH: 29.9 pg (ref 26.0–34.0)
MCHC: 31.5 g/dL (ref 30.0–36.0)
MCV: 94.9 fL (ref 80.0–100.0)
Platelets: 132 10*3/uL — ABNORMAL LOW (ref 150–400)
RBC: 4.71 MIL/uL (ref 4.22–5.81)
RDW: 14.1 % (ref 11.5–15.5)
WBC: 6.5 10*3/uL (ref 4.0–10.5)
nRBC: 0 % (ref 0.0–0.2)

## 2019-06-19 NOTE — ED Notes (Signed)
Patient requesting to use restroom prior to triage. Alissa RN assisting patient to restroom.

## 2019-06-19 NOTE — ED Triage Notes (Signed)
Patient coming [facility] from home for medial chest pain, SOB, and weakness that begun earlier this evening.

## 2019-06-19 NOTE — ED Triage Notes (Signed)
First Nurse: patient brought in by ems from home. Patient with complaint of sudden onset of chest pain times one hour. Patient has a history of CHF and afib. Patient given 324 mg of asa and 1 nitro times one.

## 2019-06-20 ENCOUNTER — Emergency Department: Payer: Medicare Other

## 2019-06-20 ENCOUNTER — Emergency Department
Admission: EM | Admit: 2019-06-20 | Discharge: 2019-06-20 | Disposition: A | Discharge status: Home / Self Care | Payer: Medicare Other | Attending: Emergency Medicine | Admitting: Emergency Medicine

## 2019-06-20 DIAGNOSIS — J439 Emphysema, unspecified: Secondary | ICD-10-CM | POA: Diagnosis not present

## 2019-06-20 DIAGNOSIS — R079 Chest pain, unspecified: Secondary | ICD-10-CM

## 2019-06-20 DIAGNOSIS — I509 Heart failure, unspecified: Secondary | ICD-10-CM

## 2019-06-20 LAB — BASIC METABOLIC PANEL
Anion gap: 8 (ref 5–15)
BUN: 46 mg/dL — ABNORMAL HIGH (ref 8–23)
CO2: 28 mmol/L (ref 22–32)
Calcium: 8.9 mg/dL (ref 8.9–10.3)
Chloride: 107 mmol/L (ref 98–111)
Creatinine, Ser: 2.25 mg/dL — ABNORMAL HIGH (ref 0.61–1.24)
GFR calc Af Amer: 30 mL/min — ABNORMAL LOW (ref >60)
GFR calc non Af Amer: 26 mL/min — ABNORMAL LOW (ref >60)
Glucose, Bld: 132 mg/dL — ABNORMAL HIGH (ref 70–99)
Potassium: 4.1 mmol/L (ref 3.5–5.1)
Sodium: 143 mmol/L (ref 135–145)

## 2019-06-20 LAB — TROPONIN I (HIGH SENSITIVITY)
Troponin I (High Sensitivity): 18 ng/L — ABNORMAL HIGH (ref <18)
Troponin I (High Sensitivity): 21 ng/L — ABNORMAL HIGH (ref <18)

## 2019-06-20 LAB — BRAIN NATRIURETIC PEPTIDE: B Natriuretic Peptide: 200.7 pg/mL — ABNORMAL HIGH (ref 0.0–100.0)

## 2019-06-20 MED ORDER — ONDANSETRON HCL 4 MG/2ML IJ SOLN
4.0000 mg | Freq: Once | INTRAMUSCULAR | Status: AC
  Administered 2019-06-20: 4 mg via INTRAVENOUS
  Filled 2019-06-20: qty 2

## 2019-06-20 MED ORDER — MORPHINE SULFATE (PF) 4 MG/ML IV SOLN
4.0000 mg | Freq: Once | INTRAVENOUS | Status: AC
  Administered 2019-06-20: 4 mg via INTRAVENOUS
  Filled 2019-06-20: qty 1

## 2019-06-20 MED ORDER — FUROSEMIDE 10 MG/ML IJ SOLN
20.0000 mg | Freq: Once | INTRAMUSCULAR | Status: AC
  Administered 2019-06-20: 20 mg via INTRAVENOUS
  Filled 2019-06-20: qty 4

## 2019-06-20 NOTE — ED Provider Notes (Signed)
[facility] Emergency Department Provider Note  ____________________________________________  Time seen: Approximately 5:14 AM  I have reviewed the triage vital signs and the nursing notes.   HISTORY  Chief Complaint Chest Pain   HPI [name] is a 86 y.o. male with a history of CHF with a EF of 40% (echo from 9/20), atrial fibrillation on Eliquis, diabetes, hypertension, CAD who presents for evaluation of chest pain.  Patient describes the chest pain as intermittent stabbing sensation located in the center of his chest lasting seconds at a time.  Has had several that started suddenly this past night.  He reports that when the pain hits he has shortness of breath but everything then resolves without intervention.  The pain does not radiate to his back or to his abdomen.  No cough or fever.  No weakness or numbness of his extremities.  He reports having similar pain in the past when he had CHF exacerbation.  He denies orthopnea.  He denies leg pain or swelling, history of PE or DVT, recent travel immobilization, hemoptysis, exogenous hormones.  He received one full dose of aspirin and nitro per EMS with no changes in his pain.  Past Medical History:  Diagnosis Date  . Atrial fibrillation ([place])   . CHF (congestive heart failure) ([place])   . Diabetes ([place])   . Hearing loss   . High blood pressure   . History of bladder problems   . Kidney stones 11/22/2014    Patient Active Problem List   Diagnosis Date Noted  . Diverticulosis of colon 02/07/2019  . Chronic obstructive pulmonary disease ([place])   . Chronic a-fib ([place])   . Type 2 diabetes mellitus with hyperglycemia ([place]) 10/23/2018  . Secondary hypercoagulability disorder ([place]) 10/23/2018  . Acute or subacute form of ischemic heart disease ([place]) 10/23/2018  . Need for vaccination against Streptococcus pneumoniae using pneumococcal conjugate vaccine 13 10/23/2018  . Chronic mesenteric ischemia ([place])  07/28/2018  . Abdominal aortic aneurysm without rupture ([place]) 07/28/2018  . Primary osteoarthritis of left hip 02/12/2018  . Bilateral hearing loss due to cerumen impaction 10/06/2017  . Chronic systolic heart failure ([place]) 08/19/2017  . Atrial fibrillation with RVR ([place]) 08/02/2017  . Acute on chronic congestive heart failure ([place]) 08/02/2017  . Prostatic hypertrophy 07/20/2017  . Dysuria 07/20/2017  . Hyperpigmentation 04/10/2017  . Uncontrolled type 2 diabetes mellitus with hyperglycemia ([place]) 03/24/2017  . Encounter for general adult medical examination with abnormal findings 03/24/2017  . Chest pain 05/04/2016  . Elevated troponin 05/04/2016  . Essential hypertension 07/06/2014  . Cough 03/21/2014  . OSA (obstructive sleep apnea) 03/21/2014  . Paroxysmal A-fib ([place]) 03/21/2014  . Hx of adenomatous colonic polyps 06/28/2013  . Liver cyst 06/28/2013  . Chronic kidney disease 06/08/2013  . Type 2 diabetes mellitus with stage 3 chronic kidney disease, without long-term current use of insulin ([place]) 06/08/2013  . Hyperlipidemia, mixed 06/08/2013  . Valvular heart disease 06/08/2013    Past Surgical History:  Procedure Laterality Date  . CATARACT EXTRACTION    . PROSTATE CANCER    . PROSTATE SURGERY      Prior to Admission medications   Medication Sig Start Date End Date Taking? Authorizing Provider  albuterol (VENTOLIN HFA) 108 (90 Base) MCG/ACT inhaler Inhale 2 puffs into the lungs every 4 (four) hours as needed for wheezing or shortness of breath. 12/10/18   Arta Silence, MD  atorvastatin (LIPITOR) 40 MG tablet Take 1 tablet (40 mg total)  by mouth daily at 6 PM. 05/17/19   Ronnell Freshwater, NP  carvedilol (COREG) 25 MG tablet Take by mouth. 02/15/19   [provider]  ELIQUIS 2.5 MG TABS tablet Take 2.5 mg by mouth 2 (two) times daily. 02/15/19   [provider]  furosemide (LASIX) 20 MG tablet Take 1 tablet (20 mg total) by mouth daily. 12/24/18   Lavera Guise, MD  glucose blood (ONETOUCH VERIO) test strip USE ONCE DAILY. DX E11.65. 03/15/19   Ronnell Freshwater, NP  pantoprazole (PROTONIX) 40 MG tablet TAKE 1 TABLET(40 MG) BY MOUTH DAILY 03/23/19   Ronnell Freshwater, NP  Semaglutide,0.25 or 0.5MG /DOS, (OZEMPIC, 0.25 OR 0.5 MG/DOSE,) 2 MG/1.5ML SOPN Inject 0.5 mg into the skin once a week. 05/12/19   Ronnell Freshwater, NP    Allergies Patient has no known allergies.  Family History  Problem Relation Age of Onset  . Colon cancer Mother   . Diabetes Other   . High blood pressure Other   . Prostate cancer Brother   . Diabetes Brother     Social History Social History   Tobacco Use  . Smoking status: Former Smoker    Years: 16.00    Types: Cigarettes  . Smokeless tobacco: Never Used  Substance Use Topics  . Alcohol use: No  . Drug use: No    Review of Systems  Constitutional: Negative for fever. Eyes: Negative for visual changes. ENT: Negative for sore throat. Neck: No neck pain  Cardiovascular: + chest pain. Respiratory: Negative for shortness of breath. Gastrointestinal: Negative for abdominal pain, vomiting or diarrhea. Genitourinary: Negative for dysuria. Musculoskeletal: Negative for back pain. Skin: Negative for rash. Neurological: Negative for headaches, weakness or numbness. Psych: No SI or HI  ____________________________________________   PHYSICAL EXAM:  VITAL SIGNS: ED Triage Vitals  Enc Vitals Group     BP 06/19/19 2340 (!) 124/96     Pulse Rate 06/19/19 2340 (!) 58     Resp 06/19/19 2340 17     Temp 06/19/19 2340 97.8 F (36.6 C)     Temp Source 06/19/19 2340 Oral     SpO2 06/19/19 2340 98 %     Weight 06/19/19 2337 240 lb (108.9 kg)     Height 06/19/19 2337 6' (1.829 m)     Head Circumference --      Peak Flow --      Pain Score 06/19/19 2336 1     Pain Loc --      Pain Edu? --      Excl. in [place]? --     Constitutional: Alert and oriented. Well appearing and in no apparent  distress. HEENT:      Head: Normocephalic and atraumatic.         Eyes: Conjunctivae are normal. Sclera is non-icteric.       Mouth/Throat: Mucous membranes are moist.       Neck: Supple with no signs of meningismus. Cardiovascular: Regular rate and rhythm. No murmurs, gallops, or rubs. 2+ symmetrical distal pulses are present in all extremities. Respiratory: Normal respiratory effort. Lungs are clear to auscultation bilaterally. No wheezes, crackles, or rhonchi.  Gastrointestinal: Soft, non tender Musculoskeletal: 1+ pitting edema bilaterally Neurologic: Normal speech and language. Face is symmetric. Moving all extremities. No gross focal neurologic deficits are appreciated. Skin: Skin is warm, dry and intact. No rash noted. Psychiatric: Mood and affect are normal. Speech and behavior are normal.  ____________________________________________   LABS (all labs ordered  are listed, but only abnormal results are displayed)  Labs Reviewed  BASIC METABOLIC PANEL - Abnormal; Notable for the following components:      Result Value   Glucose, Bld 132 (*)    BUN 46 (*)    Creatinine, Ser 2.25 (*)    GFR calc non Af Amer 26 (*)    GFR calc Af Amer 30 (*)    All other components within normal limits  CBC - Abnormal; Notable for the following components:   Platelets 132 (*)    All other components within normal limits  BRAIN NATRIURETIC PEPTIDE - Abnormal; Notable for the following components:   B Natriuretic Peptide 200.7 (*)    All other components within normal limits  TROPONIN I (HIGH SENSITIVITY) - Abnormal; Notable for the following components:   Troponin I (High Sensitivity) 21 (*)    All other components within normal limits  TROPONIN I (HIGH SENSITIVITY) - Abnormal; Notable for the following components:   Troponin I (High Sensitivity) 18 (*)    All other components within normal limits   ____________________________________________  EKG  Atrial fibrillation with right bundle  branch block, LAFB, rate of 86, normal QTC, left axis deviation, anterior Q waves and no ST elevations.  Unchanged from prior. ____________________________________________  RADIOLOGY  I have personally reviewed the images performed during this visit and I agree with the Radiologist's read.   Interpretation by Radiologist:  DG Chest 2 View  Result Date: 06/20/2019 CLINICAL DATA:  Chest pain EXAM: CHEST - 2 VIEW COMPARISON:  None. FINDINGS: There is mild cardiomegaly. Prominence of the central pulmonary vasculature seen. No large airspace consolidation or pleural effusion. Stable elevation of the left hemidiaphragm. No acute osseous abnormality. IMPRESSION: Mild cardiomegaly and pulmonary vascular congestion. Electronically Signed   By: Prudencio Pair M.D.   On: 06/20/2019 00:06   CT Chest Wo Contrast  Result Date: 06/20/2019 CLINICAL DATA:  Sudden onset chest pain.  History of CHF. EXAM: CT CHEST WITHOUT CONTRAST TECHNIQUE: Multidetector CT imaging of the chest was performed following the standard protocol without IV contrast. COMPARISON:  Chest radiograph from one day prior. 04/13/2018 chest CT. FINDINGS: Cardiovascular: Mild cardiomegaly. No significant pericardial effusion/thickening. Left anterior descending and right coronary atherosclerosis. Atherosclerotic nonaneurysmal thoracic aorta. Top-normal caliber main pulmonary artery (3.2 cm diameter). Mediastinum/Nodes: No discrete thyroid nodules. Unremarkable esophagus. No pathologically enlarged axillary, mediastinal or hilar lymph nodes, noting limited sensitivity for the detection of hilar adenopathy on this noncontrast study. Lungs/Pleura: No pneumothorax. No pleural effusion. Mild paraseptal emphysema with diffuse bronchial wall thickening. No acute consolidative airspace disease, lung masses or significant pulmonary nodules. Mild patchy subpleural reticulation in the lower lungs with associated scattered small parenchymal bands, not substantially  changed. Upper abdomen: Simple 3.2 cm anterior left liver cyst. Partially visualized hypodense peripheral right liver 4.9 cm mass (series 2/image 143), stable since 04/13/2018 CT, presumably benign. Nonobstructing stones in the visualized upper kidneys bilaterally, largest 5 mm in the upper left kidney. Musculoskeletal: No aggressive appearing focal osseous lesions. Moderate thoracic spondylosis. IMPRESSION: 1. No acute pulmonary disease. Chronic scattered small parenchymal bands and nonspecific subpleural reticulation in the lower lungs, favoring nonspecific postinfectious/postinflammatory scarring. 2. Mild cardiomegaly. 3. Nonobstructing bilateral nephrolithiasis. 4. Aortic Atherosclerosis (ICD10-I70.0) and Emphysema (ICD10-J43.9). Electronically Signed   By: Ilona Sorrel M.D.   On: 06/20/2019 05:43     ____________________________________________   PROCEDURES  Procedure(s) performed:yes .1-3 Lead EKG Interpretation Performed by: Rudene Re, MD Authorized by: Rudene Re, MD  Interpretation: abnormal     Rhythm: atrial fibrillation     Ectopy: none     Conduction: abnormal     Abnormal conduction comment:  RBBB   Critical Care performed:  None ____________________________________________   INITIAL IMPRESSION / ASSESSMENT AND PLAN / ED COURSE   86 y.o. male with a history of CHF with a EF of 40% (echo from 9/20), atrial fibrillation on Eliquis, diabetes, hypertension, CAD who presents for evaluation of intermittent stabbing chest pain that started this evening.  According to the patient, similar to prior CHF exacerbation.  Patient is well-appearing in no distress, has normal vital signs, pulses are equal in all 4 extremities, normal neurological exam.  EKG showing A. fib with right bundle branch block and well controlled rate which is unchanged from baseline.  Differential diagnosis CHF versus ACS versus GERD versus MSK versus pneumonia versus pneumothorax versus  myocarditis versus pericarditis versus pleurisy versus costochondritis. Aortic dissection considered, however there were with no typical symptoms of chest pain radiating through to intrascapular back, no severe hypertension, symmetric bilateral radial pulses, no associated neurologic deficits, no associated abdominal or lower extremity symptoms, no marfanoid features or evidence of underlying connective tissue disorder. Therefore if chest x-ray is without evidence of mediastinal widening, will not pursue further diagnosis at this time. Less likely PE since patient is on Eliquis, has no tachypnea, tachycardia, hypoxia.  Patient received aspirin per EMS.  Given a dose of IV morphine and Zofran here for symptom relief.  Patient had 2 high-sensitivity troponin measuring 21 and 18 which is patient's baseline.  Kidney function is stable with a creatinine of 2.25.  No electrolyte derangements.  No leukocytosis.  No anemia.  BNP slightly elevated at 200.  Chest x-ray visualized by me showing mild cardiomegaly and pulmonary edema which is confirmed by radiology.  Normal work of breathing and normal sats both at rest and with ambulation.  Patient was given a dose of IV Lasix to help further diuresis. CT chest done without contrast due to significant CKD and a GFR less than 60 but also showed no abnormalities, confirmed by radiology.  History gathered from patient and his wife over the phone.  Old medical records reviewed.  Patient placed on telemetry for close monitoring.     _________________________ 6:50 AM on 06/20/2019 -----------------------------------------  Repeat troponin with no significant changes.  Patient with no further episodes of chest pain in the emergency room.  Remains stable.  Good diuresis after IV Lasix.  Recommended close follow-up with Dr. [name].  Discussed my standard return precautions.  _____________________________________________ Please note:  Patient was evaluated in Emergency  Department today for the symptoms described in the history of present illness. Patient was evaluated in the context of the global COVID-19 pandemic, which necessitated consideration that the patient might be at risk for infection with the SARS-CoV-2 virus that causes COVID-19. Institutional protocols and algorithms that pertain to the evaluation of patients at risk for COVID-19 are in a state of rapid change based on information released by regulatory bodies including the CDC and federal and state organizations. These policies and algorithms were followed during the patient's care in the ED.  Some ED evaluations and interventions may be delayed as a result of limited staffing during the pandemic.   [place] Controlled Substance Database was reviewed by me. ____________________________________________   FINAL CLINICAL IMPRESSION(S) / ED DIAGNOSES   Final diagnoses:  Chest pain, unspecified type  Acute on chronic congestive heart failure, unspecified heart failure type ([place])  NEW MEDICATIONS STARTED DURING THIS VISIT:  ED Discharge Orders    None       Note:  This document was prepared using Dragon voice recognition software and may include unintentional dictation errors.    Alfred Levins, [place], MD 06/20/19 [phone]

## 2019-06-20 NOTE — ED Notes (Signed)
Patient to CT via stretcher.

## 2019-06-20 NOTE — ED Notes (Signed)
This RN called into  sub wait area by pt c/o CP, first RN notified, V/S collected, pt reports this pain is the same as initial presentation; pt alert and denies changes to pain and pt appears without sweating,  apologies made for pain and wait

## 2019-06-20 NOTE — ED Notes (Signed)
Patient resting quietly at this time.

## 2019-06-20 NOTE — ED Notes (Signed)
Patient resting quietly at this time.  No acute distress noted.

## 2019-06-20 NOTE — ED Notes (Signed)
Patient reports having stabbing pain that comes and goes since last night.  Patient reports nothing makes pain better or worse.  Patient reports history of CHF.

## 2019-06-21 ENCOUNTER — Emergency Department
Admission: EM | Admit: 2019-06-21 | Discharge: 2019-06-21 | Disposition: A | Discharge status: Home / Self Care | Payer: Medicare Other | Attending: Emergency Medicine | Admitting: Emergency Medicine

## 2019-06-21 ENCOUNTER — Encounter: Payer: Self-pay | Admitting: Emergency Medicine

## 2019-06-21 ENCOUNTER — Other Ambulatory Visit: Payer: Self-pay

## 2019-06-21 ENCOUNTER — Emergency Department: Payer: Medicare Other

## 2019-06-21 DIAGNOSIS — I110 Hypertensive heart disease with heart failure: Secondary | ICD-10-CM | POA: Insufficient documentation

## 2019-06-21 DIAGNOSIS — E119 Type 2 diabetes mellitus without complications: Secondary | ICD-10-CM | POA: Diagnosis not present

## 2019-06-21 DIAGNOSIS — Z7984 Long term (current) use of oral hypoglycemic drugs: Secondary | ICD-10-CM | POA: Insufficient documentation

## 2019-06-21 DIAGNOSIS — Z743 Need for continuous supervision: Secondary | ICD-10-CM | POA: Diagnosis not present

## 2019-06-21 DIAGNOSIS — I2510 Atherosclerotic heart disease of native coronary artery without angina pectoris: Secondary | ICD-10-CM | POA: Insufficient documentation

## 2019-06-21 DIAGNOSIS — Z79899 Other long term (current) drug therapy: Secondary | ICD-10-CM | POA: Diagnosis not present

## 2019-06-21 DIAGNOSIS — K5641 Fecal impaction: Secondary | ICD-10-CM | POA: Diagnosis not present

## 2019-06-21 DIAGNOSIS — K5900 Constipation, unspecified: Secondary | ICD-10-CM | POA: Diagnosis not present

## 2019-06-21 DIAGNOSIS — F17210 Nicotine dependence, cigarettes, uncomplicated: Secondary | ICD-10-CM | POA: Insufficient documentation

## 2019-06-21 DIAGNOSIS — R079 Chest pain, unspecified: Secondary | ICD-10-CM | POA: Diagnosis not present

## 2019-06-21 DIAGNOSIS — N183 Chronic kidney disease, stage 3 unspecified: Secondary | ICD-10-CM | POA: Diagnosis not present

## 2019-06-21 DIAGNOSIS — I5022 Chronic systolic (congestive) heart failure: Secondary | ICD-10-CM | POA: Diagnosis not present

## 2019-06-21 DIAGNOSIS — N189 Chronic kidney disease, unspecified: Secondary | ICD-10-CM

## 2019-06-21 DIAGNOSIS — R0789 Other chest pain: Secondary | ICD-10-CM | POA: Diagnosis not present

## 2019-06-21 DIAGNOSIS — R5381 Other malaise: Secondary | ICD-10-CM | POA: Diagnosis not present

## 2019-06-21 DIAGNOSIS — K625 Hemorrhage of anus and rectum: Secondary | ICD-10-CM

## 2019-06-21 DIAGNOSIS — I129 Hypertensive chronic kidney disease with stage 1 through stage 4 chronic kidney disease, or unspecified chronic kidney disease: Secondary | ICD-10-CM | POA: Diagnosis not present

## 2019-06-21 LAB — BASIC METABOLIC PANEL
Anion gap: 11 (ref 5–15)
BUN: 48 mg/dL — ABNORMAL HIGH (ref 8–23)
CO2: 26 mmol/L (ref 22–32)
Calcium: 9.5 mg/dL (ref 8.9–10.3)
Chloride: 102 mmol/L (ref 98–111)
Creatinine, Ser: 2.62 mg/dL — ABNORMAL HIGH (ref 0.61–1.24)
GFR calc Af Amer: 25 mL/min — ABNORMAL LOW (ref >60)
GFR calc non Af Amer: 21 mL/min — ABNORMAL LOW (ref >60)
Glucose, Bld: 147 mg/dL — ABNORMAL HIGH (ref 70–99)
Potassium: 4.6 mmol/L (ref 3.5–5.1)
Sodium: 139 mmol/L (ref 135–145)

## 2019-06-21 LAB — CBC WITH DIFFERENTIAL/PLATELET
Abs Immature Granulocytes: 0.03 10*3/uL (ref 0.00–0.07)
Basophils Absolute: 0 10*3/uL (ref 0.0–0.1)
Basophils Relative: 0 %
Eosinophils Absolute: 0.1 10*3/uL (ref 0.0–0.5)
Eosinophils Relative: 1 %
HCT: 48.9 % (ref 39.0–52.0)
Hemoglobin: 15.7 g/dL (ref 13.0–17.0)
Immature Granulocytes: 0 %
Lymphocytes Relative: 19 %
Lymphs Abs: 1.4 10*3/uL (ref 0.7–4.0)
MCH: 29.6 pg (ref 26.0–34.0)
MCHC: 32.1 g/dL (ref 30.0–36.0)
MCV: 92.3 fL (ref 80.0–100.0)
Monocytes Absolute: 0.6 10*3/uL (ref 0.1–1.0)
Monocytes Relative: 8 %
Neutro Abs: 5.2 10*3/uL (ref 1.7–7.7)
Neutrophils Relative %: 72 %
Platelets: 142 10*3/uL — ABNORMAL LOW (ref 150–400)
RBC: 5.3 MIL/uL (ref 4.22–5.81)
RDW: 14.1 % (ref 11.5–15.5)
WBC: 7.3 10*3/uL (ref 4.0–10.5)
nRBC: 0 % (ref 0.0–0.2)

## 2019-06-21 MED ORDER — DOCUSATE SODIUM 100 MG PO CAPS: 100.0000 mg | ORAL_CAPSULE | Freq: Two times a day (BID) | ORAL | 2 refills | Status: DC

## 2019-06-21 MED ORDER — MAGNESIUM CITRATE PO SOLN
1.0000 | Freq: Once | ORAL | Status: AC
  Administered 2019-06-21: 1 via ORAL
  Filled 2019-06-21: qty 296

## 2019-06-21 MED ORDER — LACTATED RINGERS IV BOLUS
1000.0000 mL | Freq: Once | INTRAVENOUS | Status: AC
  Administered 2019-06-21: 1000 mL via INTRAVENOUS

## 2019-06-21 NOTE — ED Notes (Addendum)
Pt's abd distended but soft. Pt reports continued discomfort but states it is a little less than earlier today. Nontender. Pt keeps bending or tensing arm when BP cuff goes off.

## 2019-06-21 NOTE — ED Notes (Signed)
This RN at bedside with EDP Jessup for rectal exam.

## 2019-06-21 NOTE — ED Notes (Signed)
Still unable to use the bathroom    States he is having some blood in the toilet and is feeling weak  Provider aware  Pt moved to room 15  Report given to [employer]

## 2019-06-21 NOTE — ED Notes (Signed)
Pt up to bedside toilet in attempt to have BM again. EDP Jessup disimpacted pt.

## 2019-06-21 NOTE — ED Triage Notes (Signed)
Pt comes into the ED via EMS from home with c/o not being able to have a BM today, states he was seen here yesterday for chest pain and had a normal BM yesterday. States he sat on the toilet for about an hour today and was not able to have a BM and wants to be checked out.

## 2019-06-21 NOTE — ED Notes (Signed)
See triage note states he thinks he may be constipated  Last BM was yesterday

## 2019-06-21 NOTE — ED Notes (Signed)
Pt having 2nd large BM now.

## 2019-06-21 NOTE — ED Notes (Signed)
Pt is up to bathroom  Mag citrate given

## 2019-06-21 NOTE — Discharge Instructions (Addendum)
Today you were found to have a stool ball in your rectum that was making it difficult for you to have a bowel movement.  This was cleared but you likely still have significant stool to pass.  Please start taking the Colace as well as a fiber supplement such as Metamucil.  You may also take over-the-counter MiraLAX powder as needed.  Please start taking 1 capful of MiraLAX twice a day, if you do not have results you may increase this by 1 capful each day up to 4 capfuls twice a day.  Please drink plenty of fluids in order to match fluid loss with the laxatives.  You will need to follow-up with your primary care doctor regarding your kidney function for recheck.  If you have worsening bleeding or are not making much urine, please return to the ER for reevaluation.

## 2019-06-21 NOTE — ED Notes (Signed)
Pt up to bedside toilet in attempt to have a BM. Family given remote to tv.

## 2019-06-21 NOTE — ED Notes (Signed)
Pt up to bedside toilet again.

## 2019-06-21 NOTE — ED Provider Notes (Signed)
[facility] Department Provider Note   ____________________________________________   First MD Initiated Contact with Patient 06/21/19 1254     (approximate)  I have reviewed the triage vital signs and the nursing notes.   HISTORY  Chief Complaint Constipation    HPI [name] is a 86 y.o. male patient arrived via EMS complaining of constipation.  Patient stated no bowel movement today.  Patient said he had a bowel movement yesterday.  Patient also seen this facility yesterday for chest pain patient is a try for 1 hour to have a bowel movement today without success.  State normally has bowel movement daily.  Patient rates his pain discomfort a 10/10.  Patient describes his pain/discomfort as a fullness.      Past Medical History:  Diagnosis Date  . Atrial fibrillation ([place])   . CHF (congestive heart failure) ([place])   . Diabetes ([place])   . Hearing loss   . High blood pressure   . History of bladder problems   . Kidney stones 11/22/2014    Patient Active Problem List   Diagnosis Date Noted  . Diverticulosis of colon 02/07/2019  . Chronic obstructive pulmonary disease ([place])   . Chronic a-fib ([place])   . Type 2 diabetes mellitus with hyperglycemia ([place]) 10/23/2018  . Secondary hypercoagulability disorder ([place]) 10/23/2018  . Acute or subacute form of ischemic heart disease ([place]) 10/23/2018  . Need for vaccination against Streptococcus pneumoniae using pneumococcal conjugate vaccine 13 10/23/2018  . Chronic mesenteric ischemia ([place]) 07/28/2018  . Abdominal aortic aneurysm without rupture ([place]) 07/28/2018  . Primary osteoarthritis of left hip 02/12/2018  . Bilateral hearing loss due to cerumen impaction 10/06/2017  . Chronic systolic heart failure ([place]) 08/19/2017  . Atrial fibrillation with RVR ([place]) 08/02/2017  . Acute on chronic congestive heart failure ([place]) 08/02/2017  . Prostatic hypertrophy 07/20/2017  . Dysuria 07/20/2017  .  Hyperpigmentation 04/10/2017  . Uncontrolled type 2 diabetes mellitus with hyperglycemia ([place]) 03/24/2017  . Encounter for general adult medical examination with abnormal findings 03/24/2017  . Chest pain 05/04/2016  . Elevated troponin 05/04/2016  . Essential hypertension 07/06/2014  . Cough 03/21/2014  . OSA (obstructive sleep apnea) 03/21/2014  . Paroxysmal A-fib ([place]) 03/21/2014  . Hx of adenomatous colonic polyps 06/28/2013  . Liver cyst 06/28/2013  . Chronic kidney disease 06/08/2013  . Type 2 diabetes mellitus with stage 3 chronic kidney disease, without long-term current use of insulin ([place]) 06/08/2013  . Hyperlipidemia, mixed 06/08/2013  . Valvular heart disease 06/08/2013    Past Surgical History:  Procedure Laterality Date  . CATARACT EXTRACTION    . PROSTATE CANCER    . PROSTATE SURGERY      Prior to Admission medications   Medication Sig Start Date End Date Taking? Authorizing Provider  albuterol (VENTOLIN HFA) 108 (90 Base) MCG/ACT inhaler Inhale 2 puffs into the lungs every 4 (four) hours as needed for wheezing or shortness of breath. 12/10/18   Arta Silence, MD  atorvastatin (LIPITOR) 40 MG tablet Take 1 tablet (40 mg total) by mouth daily at 6 PM. 05/17/19   Ronnell Freshwater, NP  carvedilol (COREG) 25 MG tablet Take by mouth. 02/15/19   [provider]  ELIQUIS 2.5 MG TABS tablet Take 2.5 mg by mouth 2 (two) times daily. 02/15/19   [provider]  furosemide (LASIX) 20 MG tablet Take 1 tablet (20 mg total) by mouth daily. 12/24/18   Lavera Guise, MD  glucose  blood (ONETOUCH VERIO) test strip USE ONCE DAILY. DX E11.65. 03/15/19   Ronnell Freshwater, NP  pantoprazole (PROTONIX) 40 MG tablet TAKE 1 TABLET(40 MG) BY MOUTH DAILY 03/23/19   Ronnell Freshwater, NP  Semaglutide,0.25 or 0.5MG /DOS, (OZEMPIC, 0.25 OR 0.5 MG/DOSE,) 2 MG/1.5ML SOPN Inject 0.5 mg into the skin once a week. 05/12/19   Ronnell Freshwater, NP    Allergies Patient has no known  allergies.  Family History  Problem Relation Age of Onset  . Colon cancer Mother   . Diabetes Other   . High blood pressure Other   . Prostate cancer Brother   . Diabetes Brother     Social History Social History   Tobacco Use  . Smoking status: Former Smoker    Years: 16.00    Types: Cigarettes  . Smokeless tobacco: Never Used  Substance Use Topics  . Alcohol use: No  . Drug use: No    Review of Systems Constitutional: No fever/chills Eyes: No visual changes. ENT: No sore throat. Cardiovascular: Denies chest pain. Respiratory: Denies shortness of breath. Gastrointestinal: No abdominal pain.  No nausea, no vomiting.  No diarrhea.  Constipation. Genitourinary: Negative for dysuria. Musculoskeletal: Negative for back pain. Skin: Negative for rash. Neurological: Negative for headaches, focal weakness or numbness.   ____________________________________________   PHYSICAL EXAM:  VITAL SIGNS: ED Triage Vitals  Enc Vitals Group     BP 06/21/19 1245 108/64     Pulse Rate 06/21/19 1245 69     Resp 06/21/19 1245 (!) 22     Temp 06/21/19 1245 (!) 97.5 F (36.4 C)     Temp Source 06/21/19 1245 Oral     SpO2 06/21/19 1245 99 %     Weight 06/21/19 1243 240 lb (108.9 kg)     Height 06/21/19 1243 6' (1.829 m)     Head Circumference --      Peak Flow --      Pain Score 06/21/19 1243 10     Pain Loc --      Pain Edu? --      Excl. in [place]? --    Constitutional: Alert and oriented. Well appearing and in no acute distress. Cardiovascular: Normal rate, regular rhythm. Grossly normal heart sounds.  Good peripheral circulation. Respiratory: Normal respiratory effort.  No retractions. Lungs CTAB. Gastrointestinal: Soft and nontender. No distention. No abdominal bruits. No CVA tenderness. Musculoskeletal: No lower extremity tenderness nor edema.  No joint effusions. Neurologic:  Normal speech and language. No gross focal neurologic deficits are appreciated. No gait  instability. Skin:  Skin is warm, dry and intact. No rash noted. Psychiatric: Mood and affect are normal. Speech and behavior are normal.  ____________________________________________   LABS (all labs ordered are listed, but only abnormal results are displayed)  Labs Reviewed  BASIC METABOLIC PANEL  CBC WITH DIFFERENTIAL/PLATELET   ____________________________________________  EKG   ____________________________________________  RADIOLOGY  ED MD interpretation:    Official radiology report(s): DG Abdomen 1 View  Result Date: 06/21/2019 CLINICAL DATA:  Constipation. EXAM: ABDOMEN - 1 VIEW COMPARISON:  CT abdomen pelvis 02/15/2018, abdominal radiograph 08/07/2017 FINDINGS: There are no dilated loops of bowel to suggest obstruction. No supine evidence for free air. Moderate amount of stool throughout the colon. Surgical clips are noted throughout the lower pelvis. No radiopaque renal calculus. No acute finding in the visualized skeleton. IMPRESSION: Nonobstructive bowel gas pattern. Moderate stool burden. Electronically Signed   By: Audie Pinto M.D.   On: 06/21/2019 13:49  ____________________________________________   PROCEDURES  Procedure(s) performed (including Critical Care):  Procedures   ____________________________________________   INITIAL IMPRESSION / ASSESSMENT AND PLAN / ED COURSE  As part of my medical decision making, I reviewed the following data within the [facility]     Patient presents with complaint of constipation since he did have his daily bowel movement today.  Discussed KUB which is negative for dilated loops or bowel obstructions.  Status post mag citrate patient still unable to have bowel movement.  While sitting on the toilet it was brought to my attention that it is blood in the bowl.  Patient states this is the first time he has noticed rectal bleeding.  Will repeat BMP and CBC and have patient further evaluated by  Dr.          ____________________________________________   FINAL CLINICAL IMPRESSION(S) / ED DIAGNOSES  Final diagnoses:  Slow transit constipation     ED Discharge Orders    None       Note:  This document was prepared using Dragon voice recognition software and may include unintentional dictation errors.    Sable Feil, PA-C 06/21/19 1517    Merlyn Lot, MD 06/21/19 [phone]

## 2019-06-21 NOTE — ED Provider Notes (Signed)
-----------------------------------------   4:01 PM on 06/21/2019 -----------------------------------------  Assumed care from [place].  Patient is an 86 year old male with past medical history of atrial fibrillation on Eliquis, hypertension, diabetes, CAD, and CHF who initially presented to the ED for constipation.  Patient reports that he was able to have a bowel movement yesterday but he has been unable to do so today and has been straining without success.  This is been associated with some rectal discomfort and fullness, but he denies any abdominal pain.  He was initially evaluated in the [place] area, treated with magnesium citrate and felt nauseous afterwards, but has not vomited.  When he attempted to have a bowel movement he passed a small amount of blood into the bowl.  He again tried to have a bowel movement, did not pass any blood or stool, but noticed some bright red blood when he wiped.  He denies any history of hemorrhoids.  Blood work is remarkable for elevated creatinine, however this seems relatively comparable to patient's levels in the past.  H&H is stable and I doubt significant GI bleeding, he does appear to have fecal impaction on exam with some mild associated bleeding.  He was disimpacted with improvement in discomfort, we will hydrate with IV fluids.  ------------------------------------------------------------------------------------------------------------------- Fecal Disimpaction Procedure Note:  Performed by me:  Patient placed in the lateral recumbent position with knees drawn towards chest. Nurse present for patient support. Large amount of hard brown stool removed. No complications during procedure.   ------------------------------------------------------------------------------------------------------------------  ----------------------------------------- 6:34 PM on 06/21/2019 -----------------------------------------  On reevaluation following fecal disimpaction,  patient is able to pass a large amount of stool without additional bleeding.  He reports feeling better and is appropriate for discharge home, has completed fluid bolus.  He was counseled to start taking MiraLAX, Colace, and a fiber supplement.  Also counseled to drink plenty of fluids and follow-up with his PCP for recheck of kidney function.  Patient agrees with plan.     Blake Divine, MD 06/21/19 [phone]

## 2019-06-21 NOTE — ED Notes (Signed)
Pt urinated but had no BM. When pt wiped near rectum the toilet paper had blood on it.

## 2019-06-23 ENCOUNTER — Ambulatory Visit (INDEPENDENT_AMBULATORY_CARE_PROVIDER_SITE_OTHER): Payer: Medicare Other | Admitting: Adult Health

## 2019-06-23 ENCOUNTER — Other Ambulatory Visit: Payer: Self-pay

## 2019-06-23 ENCOUNTER — Encounter: Payer: Self-pay | Admitting: Adult Health

## 2019-06-23 VITALS — BP 95/68 | HR 48 | Temp 96.4°F | Resp 16 | Ht 72.0 in | Wt 236.0 lb

## 2019-06-23 DIAGNOSIS — I4891 Unspecified atrial fibrillation: Secondary | ICD-10-CM

## 2019-06-23 DIAGNOSIS — E1165 Type 2 diabetes mellitus with hyperglycemia: Secondary | ICD-10-CM

## 2019-06-23 DIAGNOSIS — I10 Essential (primary) hypertension: Secondary | ICD-10-CM | POA: Diagnosis not present

## 2019-06-23 DIAGNOSIS — K5909 Other constipation: Secondary | ICD-10-CM | POA: Diagnosis not present

## 2019-06-23 DIAGNOSIS — I509 Heart failure, unspecified: Secondary | ICD-10-CM

## 2019-06-23 DIAGNOSIS — R7989 Other specified abnormal findings of blood chemistry: Secondary | ICD-10-CM

## 2019-06-23 NOTE — Progress Notes (Signed)
[facility] [address] [place], [place] [address]  Internal MEDICINE  Office Visit Note  Patient Name: [name]  [id number]  [id number]  Date of Service: 06/23/2019  Chief Complaint  Patient presents with  . Follow-up    ER follow up,constipation, breathing     HPI  Pt is here for follow up.  He apparently has had two ER visits in the last week.  He reports on 5/30 he was having chest pain and presented to the ED. He was worked up for PE, and CAS. His work up was benign.       Current Medication: Outpatient Encounter Medications as of 06/23/2019  Medication Sig Note  . albuterol (VENTOLIN HFA) 108 (90 Base) MCG/ACT inhaler Inhale 2 puffs into the lungs every 4 (four) hours as needed for wheezing or shortness of breath. 12/22/2018: Caregiver reports using prn; used last this morning  . atorvastatin (LIPITOR) 40 MG tablet Take 1 tablet (40 mg total) by mouth daily at 6 PM.   . carvedilol (COREG) 25 MG tablet Take by mouth.   . docusate sodium (COLACE) 100 MG capsule Take 1 capsule (100 mg total) by mouth 2 (two) times daily.   Marland Kitchen ELIQUIS 2.5 MG TABS tablet Take 2.5 mg by mouth 2 (two) times daily.   . furosemide (LASIX) 20 MG tablet Take 1 tablet (20 mg total) by mouth daily.   Marland Kitchen glucose blood (ONETOUCH VERIO) test strip USE ONCE DAILY. DX E11.65.   . pantoprazole (PROTONIX) 40 MG tablet TAKE 1 TABLET(40 MG) BY MOUTH DAILY   . Semaglutide,0.25 or 0.5MG /DOS, (OZEMPIC, 0.25 OR 0.5 MG/DOSE,) 2 MG/1.5ML SOPN Inject 0.5 mg into the skin once a week.    No facility-administered encounter medications on file as of 06/23/2019.    Surgical History: Past Surgical History:  Procedure Laterality Date  . CATARACT EXTRACTION    . PROSTATE CANCER    . PROSTATE SURGERY      Medical History: Past Medical History:  Diagnosis Date  . Atrial fibrillation ([place])   . CHF (congestive heart failure) ([place])   . Diabetes ([place])   . Hearing loss   . High blood pressure   .  History of bladder problems   . Kidney stones 11/22/2014    Family History: Family History  Problem Relation Age of Onset  . Colon cancer Mother   . Diabetes Other   . High blood pressure Other   . Prostate cancer Brother   . Diabetes Brother     Social History   Socioeconomic History  . Marital status: Married    Spouse name: Not on file  . Number of children: Not on file  . Years of education: Not on file  . Highest education level: Not on file  Occupational History  . Occupation: [employer]  Tobacco Use  . Smoking status: Former Smoker    Years: 16.00    Types: Cigarettes  . Smokeless tobacco: Never Used  Substance and Sexual Activity  . Alcohol use: No  . Drug use: No  . Sexual activity: Not Currently  Other Topics Concern  . Not on file  Social History Narrative  . Not on file   Social Determinants of Health   Financial Resource Strain:   . Difficulty of Paying Living Expenses:   Food Insecurity:   . Worried About Charity fundraiser in the Last Year:   . Arboriculturist in the Last Year:   Transportation Needs:   .  Lack of Transportation (Medical):   Marland Kitchen Lack of Transportation (Non-Medical):   Physical Activity:   . Days of Exercise per Week:   . Minutes of Exercise per Session:   Stress:   . Feeling of Stress :   Social Connections:   . Frequency of Communication with Friends and Family:   . Frequency of Social Gatherings with Friends and Family:   . Attends Religious Services:   . Active Member of Clubs or Organizations:   . Attends Archivist Meetings:   Marland Kitchen Marital Status:   Intimate Partner Violence:   . Fear of Current or Ex-Partner:   . Emotionally Abused:   Marland Kitchen Physically Abused:   . Sexually Abused:       Review of Systems  Constitutional: Negative.  Negative for chills, fatigue and unexpected weight change.  HENT: Negative.  Negative for congestion, rhinorrhea, sneezing and sore throat.   Eyes: Negative for redness.   Respiratory: Negative.  Negative for cough, chest tightness and shortness of breath.   Cardiovascular: Negative.  Negative for chest pain and palpitations.  Gastrointestinal: Negative.  Negative for abdominal pain, constipation, diarrhea, nausea and vomiting.  Endocrine: Negative.   Genitourinary: Negative.  Negative for dysuria and frequency.  Musculoskeletal: Negative.  Negative for arthralgias, back pain, joint swelling and neck pain.  Skin: Negative.  Negative for rash.  Allergic/Immunologic: Negative.   Neurological: Negative.  Negative for tremors and numbness.  Hematological: Negative for adenopathy. Does not bruise/bleed easily.  Psychiatric/Behavioral: Negative.  Negative for behavioral problems, sleep disturbance and suicidal ideas. The patient is not nervous/anxious.     Vital Signs: BP 95/68   Pulse (!) 48   Temp (!) 96.4 F (35.8 C)   Resp 16   Ht 6' (1.829 m)   Wt 236 lb (107 kg)   SpO2 99%   BMI 32.01 kg/m    Physical Exam Vitals and nursing note reviewed.  Constitutional:      General: He is not in acute distress.    Appearance: He is well-developed. He is not diaphoretic.  HENT:     Head: Normocephalic and atraumatic.     Mouth/Throat:     Pharynx: No oropharyngeal exudate.  Eyes:     Pupils: Pupils are equal, round, and reactive to light.  Neck:     Thyroid: No thyromegaly.     Vascular: No JVD.     Trachea: No tracheal deviation.  Cardiovascular:     Rate and Rhythm: Normal rate and regular rhythm.     Heart sounds: Normal heart sounds. No murmur. No friction rub. No gallop.   Pulmonary:     Effort: Pulmonary effort is normal. No respiratory distress.     Breath sounds: Normal breath sounds. No wheezing or rales.  Chest:     Chest wall: No tenderness.  Abdominal:     Palpations: Abdomen is soft.     Tenderness: There is no abdominal tenderness. There is no guarding.  Musculoskeletal:        General: Normal range of motion.     Cervical back:  Normal range of motion and neck supple.  Lymphadenopathy:     Cervical: No cervical adenopathy.  Skin:    General: Skin is warm and dry.  Neurological:     Mental Status: He is alert and oriented to person, place, and time.     Cranial Nerves: No cranial nerve deficit.  Psychiatric:        Behavior: Behavior normal.  Thought Content: Thought content normal.        Judgment: Judgment normal.    Assessment/Plan: 1. Type 2 diabetes mellitus with hyperglycemia, without long-term current use of insulin (HCC) Controlled, continue current management.   2. Other constipation Discussed, water intake, walking, and otc medications.   3. Other congestive heart failure ([place]) Follow up with Heart failure clinic as scheduled.   4. Essential hypertension BP well controlled, continue current management.  5. Atrial fibrillation with RVR ([place]) Continue to follow up with Cardiology as discussed.   6. Elevated serum creatinine - Renal Function Panel  General Counseling: Arshia verbalizes understanding of the findings of todays visit and agrees with plan of treatment. I have discussed any further diagnostic evaluation that may be needed or ordered today. We also reviewed his medications today. he has been encouraged to call the office with any questions or concerns that should arise related to todays visit.    Orders Placed This Encounter  Procedures  . Renal Function Panel    No orders of the defined types were placed in this encounter.   Time spent: 30 Minutes   This patient was seen by Orson Gear AGNP-C in Collaboration with Dr Lavera Guise as a part of collaborative care agreement     Kendell Bane AGNP-C Internal medicine

## 2019-06-23 NOTE — Progress Notes (Signed)
Discuss with patient at visit 07/07/2019. He will need to see nephrology

## 2019-06-26 ENCOUNTER — Encounter: Payer: Self-pay | Admitting: Emergency Medicine

## 2019-06-26 ENCOUNTER — Other Ambulatory Visit: Payer: Self-pay

## 2019-06-26 ENCOUNTER — Emergency Department
Admission: EM | Admit: 2019-06-26 | Discharge: 2019-06-26 | Disposition: A | Discharge status: Home / Self Care | Payer: Medicare Other | Attending: Emergency Medicine | Admitting: Emergency Medicine

## 2019-06-26 DIAGNOSIS — K5900 Constipation, unspecified: Secondary | ICD-10-CM | POA: Diagnosis not present

## 2019-06-26 DIAGNOSIS — Z5321 Procedure and treatment not carried out due to patient leaving prior to being seen by health care provider: Secondary | ICD-10-CM | POA: Diagnosis not present

## 2019-06-26 LAB — COMPREHENSIVE METABOLIC PANEL
ALT: 40 U/L (ref 0–44)
AST: 27 U/L (ref 15–41)
Albumin: 3.6 g/dL (ref 3.5–5.0)
Alkaline Phosphatase: 106 U/L (ref 38–126)
Anion gap: 12 (ref 5–15)
BUN: 51 mg/dL — ABNORMAL HIGH (ref 8–23)
CO2: 29 mmol/L (ref 22–32)
Calcium: 9.6 mg/dL (ref 8.9–10.3)
Chloride: 100 mmol/L (ref 98–111)
Creatinine, Ser: 2.37 mg/dL — ABNORMAL HIGH (ref 0.61–1.24)
GFR calc Af Amer: 28 mL/min — ABNORMAL LOW (ref >60)
GFR calc non Af Amer: 24 mL/min — ABNORMAL LOW (ref >60)
Glucose, Bld: 163 mg/dL — ABNORMAL HIGH (ref 70–99)
Potassium: 4.1 mmol/L (ref 3.5–5.1)
Sodium: 141 mmol/L (ref 135–145)
Total Bilirubin: 1 mg/dL (ref 0.3–1.2)
Total Protein: 8.3 g/dL — ABNORMAL HIGH (ref 6.5–8.1)

## 2019-06-26 LAB — CBC
HCT: 47.4 % (ref 39.0–52.0)
Hemoglobin: 15 g/dL (ref 13.0–17.0)
MCH: 29.6 pg (ref 26.0–34.0)
MCHC: 31.6 g/dL (ref 30.0–36.0)
MCV: 93.5 fL (ref 80.0–100.0)
Platelets: 172 10*3/uL (ref 150–400)
RBC: 5.07 MIL/uL (ref 4.22–5.81)
RDW: 13.8 % (ref 11.5–15.5)
WBC: 6.7 10*3/uL (ref 4.0–10.5)
nRBC: 0 % (ref 0.0–0.2)

## 2019-06-26 LAB — LIPASE, BLOOD: Lipase: 41 U/L (ref 11–51)

## 2019-06-26 MED ORDER — SODIUM CHLORIDE 0.9% FLUSH: 3.0000 mL | Freq: Once | INTRAVENOUS | Status: DC

## 2019-06-26 NOTE — ED Notes (Signed)
Pt initially wanting to leave, left ED with his son, pt's son then brought patient back into ED and states that patient is willing to stay. Another family member then comes in to lobby speaking with patient stating dissatisfaction regarding wait times. Pt left without being seen. Ambulatory across the lobby with family member.

## 2019-06-26 NOTE — ED Triage Notes (Signed)
Pt to ED via POV stating that he has been having issues with constipation. Pt states that he has not had a good BM in 3-4 days. Pt states that he was seen on 5/31 for same problem. Pt is in NAD.

## 2019-06-28 ENCOUNTER — Other Ambulatory Visit: Payer: Self-pay

## 2019-06-28 MED ORDER — ALBUTEROL SULFATE HFA 108 (90 BASE) MCG/ACT IN AERS: 2.0000 | INHALATION_SPRAY | RESPIRATORY_TRACT | 1 refills | Status: DC | PRN

## 2019-06-29 ENCOUNTER — Telehealth: Payer: Self-pay

## 2019-06-29 ENCOUNTER — Other Ambulatory Visit: Payer: Self-pay

## 2019-06-29 ENCOUNTER — Encounter: Payer: Self-pay | Admitting: Nurse Practitioner

## 2019-06-29 ENCOUNTER — Ambulatory Visit (INDEPENDENT_AMBULATORY_CARE_PROVIDER_SITE_OTHER): Payer: Medicare Other | Admitting: Nurse Practitioner

## 2019-06-29 VITALS — BP 104/83 | HR 80 | Temp 96.7°F | Resp 16 | Ht 72.0 in | Wt 231.8 lb

## 2019-06-29 DIAGNOSIS — R300 Dysuria: Secondary | ICD-10-CM | POA: Diagnosis not present

## 2019-06-29 DIAGNOSIS — E1121 Type 2 diabetes mellitus with diabetic nephropathy: Secondary | ICD-10-CM | POA: Diagnosis not present

## 2019-06-29 DIAGNOSIS — K5641 Fecal impaction: Secondary | ICD-10-CM | POA: Diagnosis not present

## 2019-06-29 DIAGNOSIS — I482 Chronic atrial fibrillation, unspecified: Secondary | ICD-10-CM

## 2019-06-29 DIAGNOSIS — N1832 Chronic kidney disease, stage 3b: Secondary | ICD-10-CM

## 2019-06-29 DIAGNOSIS — I10 Essential (primary) hypertension: Secondary | ICD-10-CM | POA: Diagnosis not present

## 2019-06-29 LAB — POCT URINALYSIS DIPSTICK
Bilirubin, UA: NEGATIVE
Glucose, UA: NEGATIVE
Ketones, UA: NEGATIVE
Leukocytes, UA: NEGATIVE
Nitrite, UA: NEGATIVE
Protein, UA: NEGATIVE
Spec Grav, UA: 1.01 (ref 1.010–1.025)
Urobilinogen, UA: 0.2 E.U./dL
pH, UA: 5 (ref 5.0–8.0)

## 2019-06-29 MED ORDER — SITAGLIPTIN PHOSPHATE 25 MG PO TABS: 25.0000 mg | ORAL_TABLET | Freq: Every day | ORAL | 3 refills | Status: DC

## 2019-06-29 NOTE — Progress Notes (Signed)
[facility] [place], [place] [address]  Internal MEDICINE  Office Visit Note  Patient Name: [name]  [id number]  [id number]  Date of Service: 07/11/2019  Chief Complaint  Patient presents with  . having trouble with bowel movements    blood in stool      The patient presents for follow up visit. He was seen in ER on 06/21/2019 due to severe constipation and possible fecal impaction. This had been associated with some rectal discomfort and fullness, but denied abdominal pain. Was given magnesium citrate in the ER which did produce a small bowel movement which was accompanied with blood. He had stable hgb and hct. Was disimpacted and rehydrated prior to discharge from the ER. Today, he states that he continues to have the constant urge to have a bowel movement and is unable to successfully empty his bowels. These symptoms have been going on for a few months and they are gradually becoming more severe. In the ER, his serum creatinine was elevated, and this has gradually increased over the past several checked. Will refer to nephrology for further evaluation. His urinalysis shows trace of blood today.   Pt is here for a sick visit.     Current Medication:  Outpatient Encounter Medications as of 06/29/2019  Medication Sig Note  . albuterol (VENTOLIN HFA) 108 (90 Base) MCG/ACT inhaler Inhale 2 puffs into the lungs every 4 (four) hours as needed for wheezing or shortness of breath.   Marland Kitchen atorvastatin (LIPITOR) 40 MG tablet Take 1 tablet (40 mg total) by mouth daily at 6 PM.   . carvedilol (COREG) 25 MG tablet Take 25 mg by mouth 2 (two) times daily with a meal.    . docusate sodium (COLACE) 100 MG capsule Take 1 capsule (100 mg total) by mouth 2 (two) times daily.   Marland Kitchen ELIQUIS 2.5 MG TABS tablet Take 2.5 mg by mouth 2 (two) times daily.   Marland Kitchen glucose blood (ONETOUCH VERIO) test strip USE ONCE DAILY. DX E11.65.   . pantoprazole (PROTONIX) 40 MG tablet TAKE 1  TABLET(40 MG) BY MOUTH DAILY   . [DISCONTINUED] furosemide (LASIX) 20 MG tablet Take 1 tablet (20 mg total) by mouth daily.   . [DISCONTINUED] Semaglutide,0.25 or 0.5MG /DOS, (OZEMPIC, 0.25 OR 0.5 MG/DOSE,) 2 MG/1.5ML SOPN Inject 0.5 mg into the skin once a week. 06/29/2019: ozempic causing severe constipation  . sitaGLIPtin (JANUVIA) 25 MG tablet Take 1 tablet (25 mg total) by mouth daily. 06/30/2019: New medication, Due to start yesterday.    No facility-administered encounter medications on file as of 06/29/2019.      Medical History: Past Medical History:  Diagnosis Date  . Atrial fibrillation ([place])   . CHF (congestive heart failure) ([place])   . Diabetes ([place])   . Hearing loss   . High blood pressure   . History of bladder problems   . Kidney stones 11/22/2014     Today's Vitals   06/29/19 1437  BP: 104/83  Pulse: 80  Resp: 16  Temp: (!) 96.7 F (35.9 C)  SpO2: 97%  Weight: 231 lb 12.8 oz (105.1 kg)  Height: 6' (1.829 m)   Body mass index is 31.44 kg/m.  Review of Systems  Constitutional: Positive for fatigue. Negative for activity change, chills and unexpected weight change.  HENT: Negative for congestion, postnasal drip, rhinorrhea, sneezing and sore throat.   Respiratory: Negative for cough, chest tightness, shortness of breath and wheezing.   Cardiovascular: Negative for chest  pain and palpitations.  Gastrointestinal: Positive for constipation and rectal pain. Negative for abdominal pain, diarrhea, nausea and vomiting.       The patient feels as though he has to use the bathroom many times during the day, especially after he eats. Denies abdominal cramping or diarrhea. He was recently seen in ER for fecal impaction and did get some relief of these symptoms in the ER. they have returned and continue to worsen since he was seen in the ER.  Endocrine: Negative for cold intolerance, heat intolerance, polydipsia and polyuria.       Blood sugars doing well   Genitourinary:  Negative for dysuria, frequency, hematuria and urgency.  Musculoskeletal: Negative for arthralgias, back pain, joint swelling and neck pain.  Skin: Negative for rash.  Allergic/Immunologic: Negative for environmental allergies.  Neurological: Negative for dizziness, tremors, numbness and headaches.  Hematological: Negative for adenopathy. Does not bruise/bleed easily.  Psychiatric/Behavioral: Negative for behavioral problems (Depression), sleep disturbance and suicidal ideas. The patient is not nervous/anxious.     Physical Exam Vitals and nursing note reviewed.  Constitutional:      General: He is not in acute distress.    Appearance: Normal appearance. He is well-developed. He is not diaphoretic.  HENT:     Head: Normocephalic and atraumatic.     Mouth/Throat:     Pharynx: No oropharyngeal exudate.  Eyes:     Pupils: Pupils are equal, round, and reactive to light.  Neck:     Thyroid: No thyromegaly.     Vascular: No carotid bruit or JVD.     Trachea: No tracheal deviation.  Cardiovascular:     Rate and Rhythm: Normal rate and regular rhythm.     Heart sounds: Normal heart sounds. No murmur heard.  No friction rub. No gallop.   Pulmonary:     Effort: Pulmonary effort is normal. No respiratory distress.     Breath sounds: Normal breath sounds. No wheezing or rales.  Chest:     Chest wall: No tenderness.  Abdominal:     General: Bowel sounds are normal.     Palpations: Abdomen is soft.     Tenderness: There is no abdominal tenderness.  Genitourinary:    Comments: U/a showing trace of blood only.  Musculoskeletal:        General: Normal range of motion.     Cervical back: Normal range of motion and neck supple.  Lymphadenopathy:     Cervical: No cervical adenopathy.  Skin:    General: Skin is warm and dry.  Neurological:     Mental Status: He is alert and oriented to person, place, and time. Mental status is at baseline.     Cranial Nerves: No cranial nerve deficit.   Psychiatric:        Mood and Affect: Mood normal.        Behavior: Behavior normal.        Thought Content: Thought content normal.        Judgment: Judgment normal.   Assessment/Plan: 1. Type 2 diabetes mellitus with stage 3b chronic kidney disease, without long-term current use of insulin (HCC) Consider that ozempic may be contributing to constipation and fecal impaction. D/c ozempic. Start [place] 25mg  daily. Will refer to nephrology for further evaluation of worsening renal functions.  - Ambulatory referral to Nephrology - sitaGLIPtin (JANUVIA) 25 MG tablet; Take 1 tablet (25 mg total) by mouth daily.  Dispense: 30 tablet; Refill: 3  2. Dysuria U/a showing trace hematuria. Refer  to nephrology for further evaluation.  - POCT Urinalysis Dipstick  3. Essential hypertension Stable. Continue bp medication as prescribed   4. Fecal impaction [facility]) Patient recently seen in ER for fecal impaction. Was successfully disimpacted and rehydrated. Change ozempic to [place]. Will continue to monitor.   5. Chronic a-fib (HCC) Continue all meds as prescribed and regular visits with cardiology as scheduled.   General Counseling: quindarrius [place] understanding of the findings of todays visit and agrees with plan of treatment. I have discussed any further diagnostic evaluation that may be needed or ordered today. We also reviewed his medications today. he has been encouraged to call the office with any questions or concerns that should arise related to todays visit.    Counseling:  Diabetes Counseling:  1. Addition of ACE inh/ ARB'S for nephroprotection. Microalbumin is updated  2. Diabetic foot care, prevention of complications. Podiatry consult 3. Exercise and lose weight.  4. Diabetic eye examination, Diabetic eye exam is updated  5. Monitor blood sugar closlely. nutrition counseling.  6. Sign and symptoms of hypoglycemia including shaking sweating,confusion and headaches.  This patient  was seen by Leretha Pol FNP Collaboration with Dr Lavera Guise as a part of collaborative care agreement  Orders Placed This Encounter  Procedures  . Ambulatory referral to Nephrology  . POCT Urinalysis Dipstick    Meds ordered this encounter  Medications  . sitaGLIPtin (JANUVIA) 25 MG tablet    Sig: Take 1 tablet (25 mg total) by mouth daily.    Dispense:  30 tablet    Refill:  3    D/c ozempic due to severe constipation and possible bowel obstruction    Order Specific Question:   Supervising Provider    Answer:   Lavera Guise [[id number]]    Time spent: 45 Minutes

## 2019-06-29 NOTE — Telephone Encounter (Signed)
PATIENT ASSISTANCE FOR OZEMPIC HAS BEEN APPROVED.Marland KitchenVALID THRU 12/21/2019/TAT

## 2019-06-30 ENCOUNTER — Encounter: Payer: Self-pay | Admitting: Emergency Medicine

## 2019-06-30 ENCOUNTER — Inpatient Hospital Stay
Admission: EM | Admit: 2019-06-30 | Discharge: 2019-07-04 | DRG: 394 | Disposition: A | Discharge status: Other Acute Inpatient Hospital | Payer: Medicare Other | Attending: Internal Medicine | Admitting: Internal Medicine

## 2019-06-30 ENCOUNTER — Other Ambulatory Visit: Payer: Self-pay

## 2019-06-30 ENCOUNTER — Emergency Department: Payer: Medicare Other

## 2019-06-30 ENCOUNTER — Inpatient Hospital Stay: Payer: Medicare Other

## 2019-06-30 DIAGNOSIS — I130 Hypertensive heart and chronic kidney disease with heart failure and stage 1 through stage 4 chronic kidney disease, or unspecified chronic kidney disease: Secondary | ICD-10-CM | POA: Diagnosis present

## 2019-06-30 DIAGNOSIS — D62 Acute posthemorrhagic anemia: Secondary | ICD-10-CM | POA: Diagnosis not present

## 2019-06-30 DIAGNOSIS — N184 Chronic kidney disease, stage 4 (severe): Secondary | ICD-10-CM | POA: Diagnosis present

## 2019-06-30 DIAGNOSIS — D123 Benign neoplasm of transverse colon: Secondary | ICD-10-CM | POA: Diagnosis not present

## 2019-06-30 DIAGNOSIS — K922 Gastrointestinal hemorrhage, unspecified: Secondary | ICD-10-CM | POA: Diagnosis present

## 2019-06-30 DIAGNOSIS — I4510 Unspecified right bundle-branch block: Secondary | ICD-10-CM | POA: Diagnosis not present

## 2019-06-30 DIAGNOSIS — Z87891 Personal history of nicotine dependence: Secondary | ICD-10-CM | POA: Diagnosis not present

## 2019-06-30 DIAGNOSIS — R58 Hemorrhage, not elsewhere classified: Secondary | ICD-10-CM | POA: Diagnosis not present

## 2019-06-30 DIAGNOSIS — Z79899 Other long term (current) drug therapy: Secondary | ICD-10-CM

## 2019-06-30 DIAGNOSIS — D124 Benign neoplasm of descending colon: Secondary | ICD-10-CM | POA: Diagnosis present

## 2019-06-30 DIAGNOSIS — E1169 Type 2 diabetes mellitus with other specified complication: Secondary | ICD-10-CM | POA: Diagnosis present

## 2019-06-30 DIAGNOSIS — R52 Pain, unspecified: Secondary | ICD-10-CM | POA: Diagnosis not present

## 2019-06-30 DIAGNOSIS — N189 Chronic kidney disease, unspecified: Secondary | ICD-10-CM | POA: Diagnosis present

## 2019-06-30 DIAGNOSIS — Z800 Family history of malignant neoplasm of digestive organs: Secondary | ICD-10-CM | POA: Diagnosis not present

## 2019-06-30 DIAGNOSIS — I5022 Chronic systolic (congestive) heart failure: Secondary | ICD-10-CM | POA: Diagnosis present

## 2019-06-30 DIAGNOSIS — K921 Melena: Secondary | ICD-10-CM | POA: Diagnosis not present

## 2019-06-30 DIAGNOSIS — K626 Ulcer of anus and rectum: Principal | ICD-10-CM | POA: Diagnosis present

## 2019-06-30 DIAGNOSIS — Z7984 Long term (current) use of oral hypoglycemic drugs: Secondary | ICD-10-CM

## 2019-06-30 DIAGNOSIS — I4891 Unspecified atrial fibrillation: Secondary | ICD-10-CM | POA: Diagnosis not present

## 2019-06-30 DIAGNOSIS — E1165 Type 2 diabetes mellitus with hyperglycemia: Secondary | ICD-10-CM | POA: Diagnosis not present

## 2019-06-30 DIAGNOSIS — K5900 Constipation, unspecified: Secondary | ICD-10-CM

## 2019-06-30 DIAGNOSIS — Z833 Family history of diabetes mellitus: Secondary | ICD-10-CM

## 2019-06-30 DIAGNOSIS — H9190 Unspecified hearing loss, unspecified ear: Secondary | ICD-10-CM | POA: Diagnosis present

## 2019-06-30 DIAGNOSIS — E1122 Type 2 diabetes mellitus with diabetic chronic kidney disease: Secondary | ICD-10-CM | POA: Diagnosis not present

## 2019-06-30 DIAGNOSIS — E782 Mixed hyperlipidemia: Secondary | ICD-10-CM | POA: Diagnosis present

## 2019-06-30 DIAGNOSIS — Z20822 Contact with and (suspected) exposure to covid-19: Secondary | ICD-10-CM | POA: Diagnosis not present

## 2019-06-30 DIAGNOSIS — J449 Chronic obstructive pulmonary disease, unspecified: Secondary | ICD-10-CM | POA: Diagnosis present

## 2019-06-30 DIAGNOSIS — E1121 Type 2 diabetes mellitus with diabetic nephropathy: Secondary | ICD-10-CM | POA: Diagnosis not present

## 2019-06-30 DIAGNOSIS — Z8042 Family history of malignant neoplasm of prostate: Secondary | ICD-10-CM | POA: Diagnosis not present

## 2019-06-30 DIAGNOSIS — R0902 Hypoxemia: Secondary | ICD-10-CM | POA: Diagnosis not present

## 2019-06-30 DIAGNOSIS — G4733 Obstructive sleep apnea (adult) (pediatric): Secondary | ICD-10-CM | POA: Diagnosis present

## 2019-06-30 DIAGNOSIS — Z8546 Personal history of malignant neoplasm of prostate: Secondary | ICD-10-CM

## 2019-06-30 DIAGNOSIS — I444 Left anterior fascicular block: Secondary | ICD-10-CM | POA: Diagnosis not present

## 2019-06-30 DIAGNOSIS — K5909 Other constipation: Secondary | ICD-10-CM | POA: Diagnosis not present

## 2019-06-30 DIAGNOSIS — Z743 Need for continuous supervision: Secondary | ICD-10-CM | POA: Diagnosis not present

## 2019-06-30 DIAGNOSIS — I480 Paroxysmal atrial fibrillation: Secondary | ICD-10-CM | POA: Diagnosis not present

## 2019-06-30 DIAGNOSIS — K625 Hemorrhage of anus and rectum: Secondary | ICD-10-CM

## 2019-06-30 DIAGNOSIS — I10 Essential (primary) hypertension: Secondary | ICD-10-CM | POA: Diagnosis not present

## 2019-06-30 DIAGNOSIS — R000 Tachycardia, unspecified: Secondary | ICD-10-CM | POA: Diagnosis not present

## 2019-06-30 DIAGNOSIS — K5730 Diverticulosis of large intestine without perforation or abscess without bleeding: Secondary | ICD-10-CM | POA: Diagnosis not present

## 2019-06-30 DIAGNOSIS — N183 Chronic kidney disease, stage 3 unspecified: Secondary | ICD-10-CM | POA: Diagnosis not present

## 2019-06-30 DIAGNOSIS — Z87442 Personal history of urinary calculi: Secondary | ICD-10-CM | POA: Diagnosis not present

## 2019-06-30 DIAGNOSIS — N1832 Chronic kidney disease, stage 3b: Secondary | ICD-10-CM | POA: Diagnosis present

## 2019-06-30 DIAGNOSIS — Z7901 Long term (current) use of anticoagulants: Secondary | ICD-10-CM

## 2019-06-30 DIAGNOSIS — E119 Type 2 diabetes mellitus without complications: Secondary | ICD-10-CM | POA: Diagnosis present

## 2019-06-30 DIAGNOSIS — K635 Polyp of colon: Secondary | ICD-10-CM | POA: Diagnosis not present

## 2019-06-30 DIAGNOSIS — Z03818 Encounter for observation for suspected exposure to other biological agents ruled out: Secondary | ICD-10-CM | POA: Diagnosis not present

## 2019-06-30 DIAGNOSIS — J984 Other disorders of lung: Secondary | ICD-10-CM | POA: Diagnosis not present

## 2019-06-30 LAB — CBC WITH DIFFERENTIAL/PLATELET
Abs Immature Granulocytes: 0.02 10*3/uL (ref 0.00–0.07)
Basophils Absolute: 0 10*3/uL (ref 0.0–0.1)
Basophils Relative: 0 %
Eosinophils Absolute: 0.2 10*3/uL (ref 0.0–0.5)
Eosinophils Relative: 2 %
HCT: 46.7 % (ref 39.0–52.0)
Hemoglobin: 15 g/dL (ref 13.0–17.0)
Immature Granulocytes: 0 %
Lymphocytes Relative: 24 %
Lymphs Abs: 1.7 10*3/uL (ref 0.7–4.0)
MCH: 29.8 pg (ref 26.0–34.0)
MCHC: 32.1 g/dL (ref 30.0–36.0)
MCV: 92.7 fL (ref 80.0–100.0)
Monocytes Absolute: 0.9 10*3/uL (ref 0.1–1.0)
Monocytes Relative: 13 %
Neutro Abs: 4.3 10*3/uL (ref 1.7–7.7)
Neutrophils Relative %: 61 %
Platelets: 197 10*3/uL (ref 150–400)
RBC: 5.04 MIL/uL (ref 4.22–5.81)
RDW: 13.5 % (ref 11.5–15.5)
WBC: 7.1 10*3/uL (ref 4.0–10.5)
nRBC: 0 % (ref 0.0–0.2)

## 2019-06-30 LAB — LIPASE, BLOOD: Lipase: 51 U/L (ref 11–51)

## 2019-06-30 LAB — COMPREHENSIVE METABOLIC PANEL
ALT: 35 U/L (ref 0–44)
AST: 24 U/L (ref 15–41)
Albumin: 3.5 g/dL (ref 3.5–5.0)
Alkaline Phosphatase: 112 U/L (ref 38–126)
Anion gap: 11 (ref 5–15)
BUN: 52 mg/dL — ABNORMAL HIGH (ref 8–23)
CO2: 28 mmol/L (ref 22–32)
Calcium: 9 mg/dL (ref 8.9–10.3)
Chloride: 101 mmol/L (ref 98–111)
Creatinine, Ser: 2.75 mg/dL — ABNORMAL HIGH (ref 0.61–1.24)
GFR calc Af Amer: 23 mL/min — ABNORMAL LOW (ref >60)
GFR calc non Af Amer: 20 mL/min — ABNORMAL LOW (ref >60)
Glucose, Bld: 138 mg/dL — ABNORMAL HIGH (ref 70–99)
Potassium: 4.5 mmol/L (ref 3.5–5.1)
Sodium: 140 mmol/L (ref 135–145)
Total Bilirubin: 0.9 mg/dL (ref 0.3–1.2)
Total Protein: 7.9 g/dL (ref 6.5–8.1)

## 2019-06-30 LAB — CBC
HCT: 44.9 % (ref 39.0–52.0)
Hemoglobin: 14.6 g/dL (ref 13.0–17.0)
MCH: 29.5 pg (ref 26.0–34.0)
MCHC: 32.5 g/dL (ref 30.0–36.0)
MCV: 90.7 fL (ref 80.0–100.0)
Platelets: 203 10*3/uL (ref 150–400)
RBC: 4.95 MIL/uL (ref 4.22–5.81)
RDW: 13.5 % (ref 11.5–15.5)
WBC: 7.7 10*3/uL (ref 4.0–10.5)
nRBC: 0 % (ref 0.0–0.2)

## 2019-06-30 LAB — TYPE AND SCREEN
ABO/RH(D): O POS
Antibody Screen: NEGATIVE

## 2019-06-30 LAB — GLUCOSE, CAPILLARY: Glucose-Capillary: 131 mg/dL — ABNORMAL HIGH (ref 70–99)

## 2019-06-30 LAB — SARS CORONAVIRUS 2 BY RT PCR (HOSPITAL ORDER, PERFORMED IN ~~LOC~~ HOSPITAL LAB): SARS Coronavirus 2: NEGATIVE

## 2019-06-30 LAB — TROPONIN I (HIGH SENSITIVITY): Troponin I (High Sensitivity): 18 ng/L — ABNORMAL HIGH (ref <18)

## 2019-06-30 MED ORDER — ALBUTEROL SULFATE (2.5 MG/3ML) 0.083% IN NEBU: 2.5000 mg | INHALATION_SOLUTION | RESPIRATORY_TRACT | Status: DC | PRN

## 2019-06-30 MED ORDER — ACETAMINOPHEN 650 MG RE SUPP: 650.0000 mg | Freq: Four times a day (QID) | RECTAL | Status: DC | PRN

## 2019-06-30 MED ORDER — HYDROCODONE-ACETAMINOPHEN 5-325 MG PO TABS
1.0000 | ORAL_TABLET | ORAL | Status: DC | PRN
  Administered 2019-07-01: 2 via ORAL
  Filled 2019-06-30: qty 2

## 2019-06-30 MED ORDER — ACETAMINOPHEN 325 MG PO TABS: 650.0000 mg | ORAL_TABLET | Freq: Four times a day (QID) | ORAL | Status: DC | PRN

## 2019-06-30 MED ORDER — SODIUM CHLORIDE 0.9 % IV SOLN: 250.0000 mL | INTRAVENOUS | Status: DC | PRN

## 2019-06-30 MED ORDER — PANTOPRAZOLE SODIUM 40 MG PO TBEC
40.0000 mg | DELAYED_RELEASE_TABLET | Freq: Two times a day (BID) | ORAL | Status: DC
  Administered 2019-06-30 – 2019-07-03 (×5): 40 mg via ORAL
  Filled 2019-06-30 (×6): qty 1

## 2019-06-30 MED ORDER — SODIUM CHLORIDE 0.9% FLUSH
3.0000 mL | Freq: Two times a day (BID) | INTRAVENOUS | Status: DC
  Administered 2019-07-01 – 2019-07-03 (×4): 3 mL via INTRAVENOUS

## 2019-06-30 MED ORDER — ONDANSETRON HCL 4 MG/2ML IJ SOLN
4.0000 mg | Freq: Four times a day (QID) | INTRAMUSCULAR | Status: DC | PRN
  Administered 2019-07-01: 4 mg via INTRAVENOUS
  Filled 2019-06-30: qty 2

## 2019-06-30 MED ORDER — ALBUTEROL SULFATE (2.5 MG/3ML) 0.083% IN NEBU: 3.0000 mL | INHALATION_SOLUTION | RESPIRATORY_TRACT | Status: DC | PRN

## 2019-06-30 MED ORDER — ONDANSETRON HCL 4 MG/2ML IJ SOLN
4.0000 mg | Freq: Once | INTRAMUSCULAR | Status: AC
  Administered 2019-06-30: 4 mg via INTRAVENOUS
  Filled 2019-06-30: qty 2

## 2019-06-30 MED ORDER — INSULIN ASPART 100 UNIT/ML ~~LOC~~ SOLN
0.0000 [IU] | SUBCUTANEOUS | Status: DC
  Administered 2019-06-30 – 2019-07-01 (×3): 1 [IU] via SUBCUTANEOUS
  Administered 2019-07-01 – 2019-07-02 (×2): 2 [IU] via SUBCUTANEOUS
  Administered 2019-07-02: 1 [IU] via SUBCUTANEOUS
  Administered 2019-07-02: 2 [IU] via SUBCUTANEOUS
  Filled 2019-06-30 (×7): qty 1

## 2019-06-30 MED ORDER — ATORVASTATIN CALCIUM 20 MG PO TABS
40.0000 mg | ORAL_TABLET | Freq: Every day | ORAL | Status: DC
  Administered 2019-07-01 – 2019-07-03 (×3): 40 mg via ORAL
  Filled 2019-06-30 (×3): qty 2

## 2019-06-30 MED ORDER — MORPHINE SULFATE (PF) 4 MG/ML IV SOLN
4.0000 mg | Freq: Once | INTRAVENOUS | Status: AC
  Administered 2019-06-30: 4 mg via INTRAVENOUS
  Filled 2019-06-30: qty 1

## 2019-06-30 MED ORDER — SODIUM CHLORIDE 0.9% FLUSH: 3.0000 mL | INTRAVENOUS | Status: DC | PRN

## 2019-06-30 MED ORDER — DOCUSATE SODIUM 100 MG PO CAPS
100.0000 mg | ORAL_CAPSULE | Freq: Two times a day (BID) | ORAL | Status: DC
  Administered 2019-07-02 – 2019-07-03 (×3): 100 mg via ORAL
  Filled 2019-06-30 (×4): qty 1

## 2019-06-30 MED ORDER — DOCUSATE SODIUM 100 MG PO CAPS
100.0000 mg | ORAL_CAPSULE | Freq: Two times a day (BID) | ORAL | Status: DC
  Administered 2019-06-30: 100 mg via ORAL

## 2019-06-30 MED ORDER — ONDANSETRON HCL 4 MG PO TABS: 4.0000 mg | ORAL_TABLET | Freq: Four times a day (QID) | ORAL | Status: DC | PRN

## 2019-06-30 MED ORDER — SODIUM CHLORIDE 0.9% FLUSH
3.0000 mL | Freq: Two times a day (BID) | INTRAVENOUS | Status: DC
  Administered 2019-06-30: 3 mL via INTRAVENOUS

## 2019-06-30 NOTE — ED Triage Notes (Addendum)
Pt from home via [facility]. Per EMS, pt st having issues with constipation. Pt recent st "increase in fiber"., Pt st 1 dark BM today. Per EMS distended abd. Hx of afib, pt on Eliquis. Pt denies abd pain.    EMS reports HR between 70-126. CBG 178- pt ate 1 hour prior to arrival.   MD Jimmye Norman at bedside.

## 2019-06-30 NOTE — H&P (Signed)
[name]:[id number] DOB: [birth year] DOA: 06/30/2019     PCP: Ronnell Freshwater, NP   Outpatient Specialists:  CARDS:  Dr. [name]    GI  In [facility]   Patient arrived to ER on 06/30/19 at 1611  Patient coming from: home Lives   With family    Chief Complaint:   Chief Complaint  Patient presents with  . GI Bleeding    HPI:  [name] is a 86 y.o. male with medical history significant of DM2, CKD,  atrial fibrillation, CHF hypertension, kidney stones  constipation.    Presented with   Large bloody stool at home on Eliquis patient has been recently more constipated he has had prior history of GI bleeds in the past.  He has had large amount of dark bloody stool in the toilet today.  Patient have had numerous ER visits last week on 30 May he had chest pain or shortness of breath and presented to emergency department he was worked up for PE and coronary artery syndrome at the time and was negative he was nonetheless diagnosed with constipation and rectal exam was done at that time and attempt to disimpact.  He was prescribed bowel regimen thereafter.  Patient had recurrent episodes of rectal pain as well as sometimes sensation of bladder being full.  Wife states that today prior to starting to bleeding he had a BM which was well formed. But then he had a dark bloody stool x1 at home and as well as 1 more in emergency department   Infectious risk factors:  Reports none    Has NOt been vaccinated against COVID    in house  PCR NEgative  Lab Results  Component Value Date   [place] 06/30/2019   [place] NEGATIVE 12/11/2018   [place] NEGATIVE 08/11/2018   [place] NOT DETECTED 04/13/2018    Regarding pertinent Chronic problems:     Hyperlipidemia - on statins Lipitor   HTN on Coreg   chronic CHF systolic  - last echo November 2020 preserved EF 50-60% borderline left hypertrophy Patient on torsemide    DM 2 -  Lab Results   Component Value Date   HGBA1C 6.8 (A) 06/04/2019   on Semaglutide      COPD -  followed by pulmonology not on baseline oxygen       OSA -on nocturnal CPAP,      A. Fib -  - CHA2DS2 vas score >3 :  current  on anticoagulation with   Eliquis,           -  Rate control:  Currently controlled with  Coreg       CKD stage III - baseline Cr 2.6 Lab Results  Component Value Date   CREATININE 2.75 (H) 06/30/2019   CREATININE 2.37 (H) 06/26/2019   CREATININE 2.62 (H) 06/21/2019      While in ER: continues to have one bloody BM Hg 15  ER Provider Called: GI    Dr. Alice Reichert They Recommend admit to medicine    Will see in AM   Hospitalist was called for admission for lower Gi bleed  The following Work up has been ordered so far:  Orders Placed This Encounter  Procedures  . DG Abd 2 Views  . CBC with Differential  . Comprehensive metabolic panel  . Lipase, blood  . Urinalysis, Complete w Microscopic  . Consult to hospitalist  ALL PATIENTS BEING ADMITTED/HAVING PROCEDURES NEED COVID-19 SCREENING  . EKG  12-Lead  . Type and screen  . Insert peripheral IV    Following Medications were ordered in ER: Medications - No data to display      Consult Orders  (From admission, onward)         Start     Ordered   06/30/19 1822  Consult to hospitalist  ALL PATIENTS BEING ADMITTED/HAVING PROCEDURES NEED COVID-19 SCREENING  Once    Comments: ALL PATIENTS BEING ADMITTED/HAVING PROCEDURES NEED COVID-19 SCREENING  Provider:  (Not yet assigned)  Question Answer Comment  Place call to: [phone]   Reason for Consult Admit   Diagnosis/Clinical Info for Consult: rectal bleeding      06/30/19 1821           Significant initial  Findings: Abnormal Labs Reviewed  COMPREHENSIVE METABOLIC PANEL - Abnormal; Notable for the following components:      Result Value   Glucose, Bld 138 (*)    BUN 52 (*)    Creatinine, Ser 2.75 (*)    GFR calc non Af Amer 20 (*)    GFR calc Af Amer 23  (*)    All other components within normal limits  TROPONIN I (HIGH SENSITIVITY) - Abnormal; Notable for the following components:   Troponin I (High Sensitivity) 18 (*)    All other components within normal limits   Otherwise labs showing:    Recent Labs  Lab 06/26/19 0849 06/30/19 1620  NA 141 140  K 4.1 4.5  CO2 29 28  GLUCOSE 163* 138*  BUN 51* 52*  CREATININE 2.37* 2.75*  CALCIUM 9.6 9.0    Cr   stable,   Lab Results  Component Value Date   CREATININE 2.75 (H) 06/30/2019   CREATININE 2.37 (H) 06/26/2019   CREATININE 2.62 (H) 06/21/2019    Recent Labs  Lab 06/26/19 0849 06/30/19 1620  AST 27 24  ALT 40 35  ALKPHOS 106 112  BILITOT 1.0 0.9  PROT 8.3* 7.9  ALBUMIN 3.6 3.5   Lab Results  Component Value Date   CALCIUM 9.0 06/30/2019     WBC        Component Value Date/Time   WBC 7.1 06/30/2019 1620   ANC    Component Value Date/Time   NEUTROABS 4.3 06/30/2019 1620   NEUTROABS 2.4 10/13/2013 0415   ALC No components found for: LYMPHAB    Plt: Lab Results  Component Value Date   PLT 197 06/30/2019      COVID-19 Labs  No results for input(s): DDIMER, FERRITIN, LDH, CRP in the last 72 hours.  Lab Results  Component Value Date   SARSCOV2NAA NEGATIVE 12/11/2018   [place] NEGATIVE 08/11/2018   SARSCOV2NAA NOT DETECTED 04/13/2018      HG/HCT   stable,       Component Value Date/Time   HGB 15.0 06/30/2019 1620   HGB 14.6 06/11/2019 0953   HCT 46.7 06/30/2019 1620   HCT 46.0 06/11/2019 0953    Recent Labs  Lab 06/26/19 0849 06/30/19 1620  LIPASE 41 51   No results for input(s): AMMONIA in the last 168 hours.  No components found for: LABALBU   Troponin 18      ECG: Ordered Personally reviewed by me showing: HR : 106 Rhythm:  A.fib. W RVR,   no evidence of ischemic changes QTC 490   BNP (last 3 results) Recent Labs    08/11/18 1311 12/11/18 0358 06/19/19 2339  BNP 348.0* 430.0* 200.7*     DM  labs:   HbA1C: Recent Labs    12/11/18 0529 02/05/19 0946 06/04/19 0937  HGBA1C 7.5* 7.4* 6.8*     CBG (last 3)  No results for input(s): GLUCAP in the last 72 hours.     UA  not ordered   Ordered  KUB - WNL mild stool burden no obstruction    ED Triage Vitals  Enc Vitals Group     BP 06/30/19 1614 (!) 138/92     Pulse Rate 06/30/19 1616 100     Resp 06/30/19 1616 18     Temp 06/30/19 1616 98.1 F (36.7 C)     Temp Source 06/30/19 1616 Oral     SpO2 06/30/19 1616 100 %     Weight 06/30/19 1617 240 lb (108.9 kg)     Height 06/30/19 1617 6\' 3"  (1.905 m)     Head Circumference --      Peak Flow --      Pain Score 06/30/19 1617 0     Pain Loc --      Pain Edu? --      Excl. in [place]? --   TMAX(24)@       Latest  Blood pressure (!) 122/91, pulse (!) 106, temperature 98.1 F (36.7 C), temperature source Oral, resp. rate 20, height 6\' 3"  (1.905 m), weight 108.9 kg, SpO2 98 %.      Review of Systems:    Pertinent positives include: blood in stool  Constitutional:  No weight loss, night sweats, Fevers, chills, fatigue, weight loss  HEENT:  No headaches, Difficulty swallowing,Tooth/dental problems,Sore throat,  No sneezing, itching, ear ache, nasal congestion, post nasal drip,  Cardio-vascular:  No chest pain, Orthopnea, PND, anasarca, dizziness, palpitations.no Bilateral lower extremity swelling  GI:  No heartburn, indigestion, abdominal pain, nausea, vomiting, diarrhea, change in bowel habits, loss of appetite, melena,   hematemesis Resp:  no shortness of breath at rest. No dyspnea on exertion, No excess mucus, no productive cough, No non-productive cough, No coughing up of blood.No change in color of mucus.No wheezing. Skin:  no rash or lesions. No jaundice GU:  no dysuria, change in color of urine, no urgency or frequency. No straining to urinate.  No flank pain.  Musculoskeletal:  No joint pain or no joint swelling. No decreased range of motion. No back pain.  Psych:   No change in mood or affect. No depression or anxiety. No memory loss.  Neuro: no localizing neurological complaints, no tingling, no weakness, no double vision, no gait abnormality, no slurred speech, no confusion  All systems reviewed and apart from [place] all are negative  Past Medical History:   Past Medical History:  Diagnosis Date  . Atrial fibrillation ([place])   . CHF (congestive heart failure) ([place])   . Diabetes ([place])   . Hearing loss   . High blood pressure   . History of bladder problems   . Kidney stones 11/22/2014     Past Surgical History:  Procedure Laterality Date  . CATARACT EXTRACTION    . PROSTATE CANCER    . PROSTATE SURGERY      Social History:  Ambulatory independently       reports that he has quit smoking. His smoking use included cigarettes. He quit after 16.00 years of use. He has never used smokeless tobacco. He reports that he does not drink alcohol or use drugs.   Family History:   Family History  Problem Relation Age of Onset  . Colon cancer Mother   .  Diabetes Other   . High blood pressure Other   . Prostate cancer Brother   . Diabetes Brother     Allergies: No Known Allergies   Prior to Admission medications   Medication Sig Start Date End Date Taking? Authorizing Provider  atorvastatin (LIPITOR) 40 MG tablet Take 1 tablet (40 mg total) by mouth daily at 6 PM. 05/17/19  Yes Boscia, Greer Ee, NP  carvedilol (COREG) 25 MG tablet Take 25 mg by mouth 2 (two) times daily with a meal.  02/15/19  Yes [provider]  ELIQUIS 2.5 MG TABS tablet Take 2.5 mg by mouth 2 (two) times daily. 02/15/19  Yes [provider]  pantoprazole (PROTONIX) 40 MG tablet TAKE 1 TABLET(40 MG) BY MOUTH DAILY 03/23/19  Yes Boscia, Heather E, NP  torsemide (DEMADEX) 20 MG tablet Take 20 mg by mouth daily. 05/24/19  Yes [provider]  albuterol (VENTOLIN HFA) 108 (90 Base) MCG/ACT inhaler Inhale 2 puffs into the lungs every 4 (four) hours as  needed for wheezing or shortness of breath. 06/28/19   Ronnell Freshwater, NP  docusate sodium (COLACE) 100 MG capsule Take 1 capsule (100 mg total) by mouth 2 (two) times daily. 06/21/19 06/20/20  Blake Divine, MD  glucose blood (ONETOUCH VERIO) test strip USE ONCE DAILY. DX E11.65. 03/15/19   Ronnell Freshwater, NP  sitaGLIPtin (JANUVIA) 25 MG tablet Take 1 tablet (25 mg total) by mouth daily. 06/29/19   Ronnell Freshwater, NP   Physical Exam: Blood pressure (!) 122/91, pulse (!) 106, temperature 98.1 F (36.7 C), temperature source Oral, resp. rate 20, height 6\' 3"  (1.905 m), weight 108.9 kg, SpO2 98 %. 1. General:  in No  Acute distress   Chronically ill  -appearing 2. Psychological: Alert and   Oriented 3. Head/ENT:    Dry Mucous Membranes                          Head Non traumatic, neck supple                          Poor Dentition 4. SKIN:  decreased Skin turgor,  Skin clean Dry and intact no rash 5. Heart: Regular rate and rhythm no  Murmur, no Rub or gallop 6. Lungs:  , no wheezes or crackles   7. Abdomen: Soft non-tender, Non distended   obese  bowel sounds present 8. Lower extremities: no clubbing, cyanosis, no edema 9. Neurologically Grossly intact, moving all 4 extremities equally   10. MSK: Normal range of motion   All other LABS:     Recent Labs  Lab 06/26/19 0849 06/30/19 1620  WBC 6.7 7.1  NEUTROABS  --  4.3  HGB 15.0 15.0  HCT 47.4 46.7  MCV 93.5 92.7  PLT 172 197     Recent Labs  Lab 06/26/19 0849 06/30/19 1620  NA 141 140  K 4.1 4.5  CL 100 101  CO2 29 28  GLUCOSE 163* 138*  BUN 51* 52*  CREATININE 2.37* 2.75*  CALCIUM 9.6 9.0     Recent Labs  Lab 06/26/19 0849 06/30/19 1620  AST 27 24  ALT 40 35  ALKPHOS 106 112  BILITOT 1.0 0.9  PROT 8.3* 7.9  ALBUMIN 3.6 3.5       Cultures: No results found for: SDES, SPECREQUEST, CULT, REPTSTATUS   Radiological Exams on Admission: DG Abd 2 Views  Result Date:  06/30/2019 CLINICAL DATA:  Abdominal  pain, rectal bleeding EXAM: ABDOMEN - 2 VIEW COMPARISON:  06/21/2019 FINDINGS: Supine and upright frontal views of the abdomen and pelvis are obtained. Bowel gas pattern is unremarkable. No free gas in the greater peritoneal sac. No masses or abnormal calcifications. Postsurgical changes from prostatectomy. No acute bony abnormalities. IMPRESSION: 1. Unremarkable exam. Electronically Signed   By: Randa Ngo M.D.   On: 06/30/2019 17:38    Chart has been reviewed   Assessment/Plan  86 y.o. male with medical history significant of DM2, CKD,  atrial fibrillation, CHF hypertension, kidney stones  constipation. Admitted for GI bleed  Present on Admission: . Lower GI bleed - - Suspect Lower Gi source  No hx of PUD,  melena,    to  suggest otherwise  - Admit  For further management given:   Age >60 years,   comorbid illnesses  ,  gross rectal bleeding,  rebleeding   exposure to antiplatelet drugs and anticoagulants.  -  most likely Diverticular source vs anal fisure -  colitis less likely      -   hemodynamic instability present mild       -  Admit to stepdown given above    -  ER  Provider spoke to gastroenterology Dr. Alice Reichert they will see patient in a.m. appreciate their consult   - serial CBC.    - Monitor for any recurrence,  evidence of hemodynamic instability or significant blood loss -  type and screen,  - Transfuse as needed for hemoglobin below 7 or <9 if evidence of significant  bleeding  - Establish at least 2 PIV and fluid resuscitate   - clear liquids for tonight keep nothing by mouth post midnight,  -  monitor for Recurrent significant  Bleeding of red blood and hemodynamic instability in which case Bleeding scan and IR consult would be indicated.   . Chronic systolic heart failure ([place])  -currently appears to be fluid down will hold diuretics for tonight avoid over aggressive fluid resuscitation resume category diuretics as able  . Type 2 diabetes mellitus with stage 3  chronic kidney disease -  - Order Sensitive   SSI   -  check TSH and HgA1C  - Hold home medications    . Chronic kidney disease -stage III creatinine slightly above baseline hold diuretics for tonight and follow avoid nephrotoxic medications   . Essential hypertension -hold Coreg for tonight given GI bleed  . Hyperlipidemia, mixed -chronic stable continue home medication  . OSA (obstructive sleep apnea) -continue CPAP  . Paroxysmal A-fib (HCC) -hold Eliquis for tonight given GI bleed Hold Coreg resume when safe to do so   Possible urinary retention.  Will obtain bladder scan   Other plan as per orders.  DVT prophylaxis:  SCD      Code Status:  FULL CODE   as per patient   I had personally discussed CODE STATUS with patient and family* I had spent *min discussing goals of care and CODE STATUS  Family Communication:   Family   at  Bedside  plan of care was discussed   With  Wife,  Disposition Plan:      To home once workup is complete and patient is stable   Following barriers for discharge:  Pain controlled with PO medications                               Stable hemoglobin resolved GI bleed                             Will need to be able to tolerate PO                                                   Will need consultants to evaluate patient prior to discharge                     Would benefit from PT/OT eval prior to DC  Ordered                                       Consults called:  GI aware Dr Alice Reichert  Admission status:  ED Disposition    ED Disposition Condition [place]: [place] [100120]  Level of Care: Stepdown [14]  Covid Evaluation: Asymptomatic Screening Protocol (No Symptoms)  Diagnosis: Lower GI bleed [161096]  Admitting Physician: Toy Baker [[id number]]  Attending Physician: Toy Baker [[id number]]  Estimated length of stay: 3 - 4 days   Certification:: I certify this patient will need inpatient services for at least 2 midnights          inpatient     I Expect 2 midnight stay secondary to severity of patient's current illness need for inpatient interventions justified by the following:  hemodynamic instability despite optimal treatment (tachycardia hypotension  )   and extensive comorbidities including:  DM2   CHF  COPD/asthma CKD  .  Chronic anticoagulation  That are currently affecting medical management.   I expect  patient to be hospitalized for 2 midnights requiring inpatient medical care.  Patient is at high risk for adverse outcome (such as loss of life or disability) if not treated.  Indication for inpatient stay as follows:    Hemodynamic instability despite maximal medical therapy,    inability to maintain oral hydration    Need for operative/procedural  intervention   Need for V pain medications       Level of care       SDU tele indefinitely please discontinue once patient no longer qualifies   Precautions: admitted as  Covid Negative     PPE: Used by the provider:   P100  eye Goggles,  Gloves    Taylan Marez 06/30/2019, 11:12 PM    [facility]     after 2 AM please page floor coverage PA If 7AM-7PM, please contact the day team taking care of the patient using [email or web address]   Patient was evaluated in the context of the global COVID-19 pandemic, which necessitated consideration that the patient might be at risk for infection with the SARS-CoV-2 virus that causes COVID-19. Institutional protocols and algorithms that pertain to the evaluation of patients at risk for COVID-19 are in a state of rapid change based on information released by regulatory bodies including the CDC and federal and state organizations. These policies and algorithms were followed during  the patient's care.

## 2019-06-30 NOTE — ED Notes (Signed)
Spouse at bedside. Provide with phone by this RN to call pt's PCP>

## 2019-06-30 NOTE — ED Provider Notes (Signed)
ER Provider Note       Time seen: 4:45 PM    I have reviewed the vital signs and the nursing notes.  HISTORY   Chief Complaint GI Bleeding   HPI [name] is a 86 y.o. male with a history of atrial fibrillation, CHF, diabetes, hypertension, kidney stones who presents today for possible constipation.  According to EMS recently he has increased his fiber intake.  Patient states he has had bloody bowel movement today and this is happened to him before.  He is currently on Eliquis for A. fib.  Family states there was a large amount of dark stool today.  Past Medical History:  Diagnosis Date  . Atrial fibrillation ([place])   . CHF (congestive heart failure) ([place])   . Diabetes ([place])   . Hearing loss   . High blood pressure   . History of bladder problems   . Kidney stones 11/22/2014    Past Surgical History:  Procedure Laterality Date  . CATARACT EXTRACTION    . PROSTATE CANCER    . PROSTATE SURGERY      Allergies Patient has no known allergies.  Review of Systems Constitutional: Negative for fever. Cardiovascular: Negative for chest pain. Respiratory: Negative for shortness of breath. Gastrointestinal: Negative for abdominal pain, vomiting and diarrhea.  Positive for possible rectal bleeding Musculoskeletal: Negative for back pain. Skin: Negative for rash. Neurological: Negative for headaches, focal weakness or numbness.  All systems negative/normal/unremarkable except as stated in the HPI  ____________________________________________   PHYSICAL EXAM:  VITAL SIGNS: Vitals:   06/30/19 1616 06/30/19 1621  BP: (!) 138/92   Pulse: 100 (!) 112  Resp: 18 (!) 24  Temp: 98.1 F (36.7 C)   SpO2: 100% 98%    Constitutional: Alert and oriented. Well appearing and in no distress. Eyes: Conjunctivae are normal. Normal extraocular movements. ENT      Head: Normocephalic and atraumatic.      Nose: No congestion/rhinnorhea.      Mouth/Throat: Mucous membranes  are moist.      Neck: No stridor. Cardiovascular: Normal rate, regular rhythm. No murmurs, rubs, or gallops. Respiratory: Normal respiratory effort without tachypnea nor retractions. Breath sounds are clear and equal bilaterally. No wheezes/rales/rhonchi. Gastrointestinal: Soft and nontender. Normal bowel sounds Musculoskeletal: Nontender with normal range of motion in extremities. No lower extremity tenderness nor edema. Neurologic:  Normal speech and language. No gross focal neurologic deficits are appreciated.  Skin:  Skin is warm, dry and intact. No rash noted. Psychiatric: Speech and behavior are normal.  ____________________________________________  EKG: Interpreted by me.  Atrial fibrillation with rate of 106 bpm, right bundle branch block, LVH  ____________________________________________   LABS (pertinent positives/negatives)  Labs Reviewed  COMPREHENSIVE METABOLIC PANEL - Abnormal; Notable for the following components:      Result Value   Glucose, Bld 138 (*)    BUN 52 (*)    Creatinine, Ser 2.75 (*)    GFR calc non Af Amer 20 (*)    GFR calc Af Amer 23 (*)    All other components within normal limits  TROPONIN I (HIGH SENSITIVITY) - Abnormal; Notable for the following components:   Troponin I (High Sensitivity) 18 (*)    All other components within normal limits  CBC WITH DIFFERENTIAL/PLATELET  LIPASE, BLOOD  URINALYSIS, COMPLETE (UACMP) WITH MICROSCOPIC  TYPE AND SCREEN    RADIOLOGY  Images were viewed by me Abdomen 2 view IMPRESSION: 1. Unremarkable exam.  DIFFERENTIAL DIAGNOSIS  Constipation, iron  ingestion, anemia, rectal bleeding, gastrointestinal hemorrhage  ASSESSMENT AND PLAN  Rectal bleeding   Plan: The patient had presented for possible rectal bleeding. Patient's labs were overall unremarkable without revealing any acute changes. Patient did pass blood clots while in the ER here. This is complicated by the fact that he currently takes Eliquis  for A. fib. I will discuss with hospitalist for admission and GI consultation. He will need to have his Eliquis held.  Lenise Arena MD    Note: This note was generated in part or whole with voice recognition software. Voice recognition is usually quite accurate but there are transcription errors that can and very often do occur. I apologize for any typographical errors that were not detected and corrected.     [name], MD 06/30/19 [phone]

## 2019-07-01 DIAGNOSIS — K921 Melena: Secondary | ICD-10-CM

## 2019-07-01 LAB — CBC WITH DIFFERENTIAL/PLATELET
Abs Immature Granulocytes: 0.02 10*3/uL (ref 0.00–0.07)
Basophils Absolute: 0 10*3/uL (ref 0.0–0.1)
Basophils Relative: 1 %
Eosinophils Absolute: 0.2 10*3/uL (ref 0.0–0.5)
Eosinophils Relative: 4 %
HCT: 43.8 % (ref 39.0–52.0)
Hemoglobin: 14.2 g/dL (ref 13.0–17.0)
Immature Granulocytes: 0 %
Lymphocytes Relative: 35 %
Lymphs Abs: 2.3 10*3/uL (ref 0.7–4.0)
MCH: 29.8 pg (ref 26.0–34.0)
MCHC: 32.4 g/dL (ref 30.0–36.0)
MCV: 92 fL (ref 80.0–100.0)
Monocytes Absolute: 0.9 10*3/uL (ref 0.1–1.0)
Monocytes Relative: 14 %
Neutro Abs: 3.1 10*3/uL (ref 1.7–7.7)
Neutrophils Relative %: 46 %
Platelets: 186 10*3/uL (ref 150–400)
RBC: 4.76 MIL/uL (ref 4.22–5.81)
RDW: 13.4 % (ref 11.5–15.5)
WBC: 6.5 10*3/uL (ref 4.0–10.5)
nRBC: 0 % (ref 0.0–0.2)

## 2019-07-01 LAB — URINALYSIS, COMPLETE (UACMP) WITH MICROSCOPIC
Bacteria, UA: NONE SEEN
Bilirubin Urine: NEGATIVE
Glucose, UA: NEGATIVE mg/dL
Hgb urine dipstick: NEGATIVE
Ketones, ur: NEGATIVE mg/dL
Leukocytes,Ua: NEGATIVE
Nitrite: NEGATIVE
Protein, ur: NEGATIVE mg/dL
Specific Gravity, Urine: 1.016 (ref 1.005–1.030)
Squamous Epithelial / HPF: NONE SEEN (ref 0–5)
pH: 5 (ref 5.0–8.0)

## 2019-07-01 LAB — CBC
HCT: 45.2 % (ref 39.0–52.0)
HCT: 41.5 % (ref 39.0–52.0)
Hemoglobin: 14.4 g/dL (ref 13.0–17.0)
Hemoglobin: 13.4 g/dL (ref 13.0–17.0)
MCH: 29.8 pg (ref 26.0–34.0)
MCH: 29.8 pg (ref 26.0–34.0)
MCHC: 31.9 g/dL (ref 30.0–36.0)
MCHC: 32.3 g/dL (ref 30.0–36.0)
MCV: 93.6 fL (ref 80.0–100.0)
MCV: 92.4 fL (ref 80.0–100.0)
Platelets: 206 10*3/uL (ref 150–400)
Platelets: 196 10*3/uL (ref 150–400)
RBC: 4.83 MIL/uL (ref 4.22–5.81)
RBC: 4.49 MIL/uL (ref 4.22–5.81)
RDW: 13.5 % (ref 11.5–15.5)
RDW: 13.5 % (ref 11.5–15.5)
WBC: 8.8 10*3/uL (ref 4.0–10.5)
WBC: 8 10*3/uL (ref 4.0–10.5)
nRBC: 0 % (ref 0.0–0.2)
nRBC: 0 % (ref 0.0–0.2)

## 2019-07-01 LAB — COMPREHENSIVE METABOLIC PANEL
ALT: 31 U/L (ref 0–44)
AST: 21 U/L (ref 15–41)
Albumin: 3.3 g/dL — ABNORMAL LOW (ref 3.5–5.0)
Alkaline Phosphatase: 105 U/L (ref 38–126)
Anion gap: 10 (ref 5–15)
BUN: 49 mg/dL — ABNORMAL HIGH (ref 8–23)
CO2: 26 mmol/L (ref 22–32)
Calcium: 9 mg/dL (ref 8.9–10.3)
Chloride: 103 mmol/L (ref 98–111)
Creatinine, Ser: 2.45 mg/dL — ABNORMAL HIGH (ref 0.61–1.24)
GFR calc Af Amer: 27 mL/min — ABNORMAL LOW (ref >60)
GFR calc non Af Amer: 23 mL/min — ABNORMAL LOW (ref >60)
Glucose, Bld: 128 mg/dL — ABNORMAL HIGH (ref 70–99)
Potassium: 4 mmol/L (ref 3.5–5.1)
Sodium: 139 mmol/L (ref 135–145)
Total Bilirubin: 0.5 mg/dL (ref 0.3–1.2)
Total Protein: 7.4 g/dL (ref 6.5–8.1)

## 2019-07-01 LAB — GLUCOSE, CAPILLARY
Glucose-Capillary: 129 mg/dL — ABNORMAL HIGH (ref 70–99)
Glucose-Capillary: 127 mg/dL — ABNORMAL HIGH (ref 70–99)
Glucose-Capillary: 156 mg/dL — ABNORMAL HIGH (ref 70–99)
Glucose-Capillary: 94 mg/dL (ref 70–99)

## 2019-07-01 LAB — HEMOGLOBIN A1C
Hgb A1c MFr Bld: 7.8 % — ABNORMAL HIGH (ref 4.8–5.6)
Mean Plasma Glucose: 177.16 mg/dL

## 2019-07-01 LAB — MAGNESIUM: Magnesium: 2.2 mg/dL (ref 1.7–2.4)

## 2019-07-01 LAB — PHOSPHORUS: Phosphorus: 3.6 mg/dL (ref 2.5–4.6)

## 2019-07-01 LAB — TSH: TSH: 2.357 u[IU]/mL (ref 0.350–4.500)

## 2019-07-01 MED ORDER — LORAZEPAM 2 MG/ML IJ SOLN: 2.0000 mg | INTRAMUSCULAR | Status: DC | PRN

## 2019-07-01 MED ORDER — MAGNESIUM CITRATE PO SOLN
1.0000 | Freq: Once | ORAL | Status: AC
  Administered 2019-07-01: 1 via ORAL
  Filled 2019-07-01: qty 296

## 2019-07-01 MED ORDER — SODIUM CHLORIDE 0.9 % IV BOLUS
1000.0000 mL | Freq: Once | INTRAVENOUS | Status: AC
  Administered 2019-07-01: 1000 mL via INTRAVENOUS

## 2019-07-01 MED ORDER — PEG 3350-KCL-NA BICARB-NACL 420 G PO SOLR
4000.0000 mL | Freq: Once | ORAL | Status: AC
  Administered 2019-07-01: 4000 mL via ORAL
  Filled 2019-07-01: qty 4000

## 2019-07-01 NOTE — ED Notes (Signed)
Pt up to bathroom to have bloody BM- pt wife states that he is leaking blood from his rectum- pt given clean underwear, a pad, and wet wipes

## 2019-07-01 NOTE — ED Notes (Addendum)
Bladder scan reveals 99mL urine  PT had wet diaper Cleaned up and changed  PT with call bell to call out when he is able to provide a urine sample, pt voices understanding of need for sample

## 2019-07-01 NOTE — ED Notes (Signed)
Pt up to bathroom with another bloody stool, mod amount.

## 2019-07-01 NOTE — Evaluation (Signed)
Physical Therapy Evaluation Patient Details Name: [name] MRN: [id number] DOB: [birth year] Today's Date: 07/01/2019   History of Present Illness  Pt is an 86 y.o. male with medical history significant of DM2, CKD, COPD, atrial fibrillation, CHF, hypertension, kidney stones, and constipation who presented with large bloody stool at home.  MD assessment includes: Lower GI bleed, chronic systolic heart failure, HTN, OSA, and A-fib.    Clinical Impression  Pt pleasant and motivated to participate during the session.  Pt was Mod Ind with bed mobility and SBA with transfers with fair control.  After taking several small steps at the EOB pt reported feeling dizzy and returned to supine with BP taken at 88/65, nursing notified.  Per pt and spouse pt was walking down the hall to use the bathroom earlier in the day but has not eaten and felt weaker than baseline.  Cautiously optimistic that once medical issues are addressed that the pt will make good progress towards functional goals and will be safe to return to prior living situation with HHPT services to address deficits listed in patient problem list.      Follow Up Recommendations Home health PT;Supervision for mobility/OOB    Equipment Recommendations  Rolling walker with 5" wheels    Recommendations for Other Services       Precautions / Restrictions Precautions Precautions: Fall Restrictions Weight Bearing Restrictions: No      Mobility  Bed Mobility Overal bed mobility: Modified Independent             General bed mobility comments: Extra time and effort  Transfers Overall transfer level: Needs assistance Equipment used: Rolling walker (2 wheeled) Transfers: Sit to/from Stand Sit to Stand: Supervision         General transfer comment: Fair eccentric and concentric control and stability  Ambulation/Gait   Gait Distance (Feet): 3 Feet Assistive device: Rolling walker (2 wheeled) Gait Pattern/deviations:  Step-through pattern;Decreased step length - right;Decreased step length - left Gait velocity: decreased   General Gait Details: Pt only able to take several small steps at EOB before requiring to return to sitting and then supine secondary to dizziness, BP 88/65 in supine, nursing notified  Stairs            Wheelchair Mobility    Modified Rankin (Stroke Patients Only)       Balance Overall balance assessment: Needs assistance   Sitting balance-Leahy Scale: Good     Standing balance support: Bilateral upper extremity supported Standing balance-Leahy Scale: Fair                               Pertinent Vitals/Pain Pain Assessment: No/denies pain    Home Living Family/patient expects to be discharged to:: Private residence Living Arrangements: Spouse/significant other Available Help at Discharge: Family;Available 24 hours/day Type of Home: House Home Access: Stairs to enter Entrance Stairs-Rails: None Entrance Stairs-Number of Steps: 1 Home Layout: One level Home Equipment: None      Prior Function Level of Independence: Independent         Comments: Ind amb limited community distances without an AD, Ind with ADLs, no fall history     Hand Dominance        Extremity/Trunk Assessment   Upper Extremity Assessment Upper Extremity Assessment: Generalized weakness    Lower Extremity Assessment Lower Extremity Assessment: Generalized weakness       Communication   Communication: No difficulties  Cognition Arousal/Alertness:  Awake/alert Behavior During Therapy: WFL for tasks assessed/performed Overall Cognitive Status: Within Functional Limits for tasks assessed                                        General Comments      Exercises Total Joint Exercises Ankle Circles/Pumps: Strengthening;Both;5 reps;10 reps Quad Sets: Strengthening;Both;10 reps Gluteal Sets: Strengthening;Both;10 reps Hip ABduction/ADduction:  Strengthening;Both;5 reps Straight Leg Raises: Strengthening;Both;5 reps Long Arc Quad: Strengthening;Both;10 reps Other Exercises Other Exercises: HEP education for BLE APs, QS, LAQs, and GS x 10 each 5-6x/day, BLE hip abd/add x 10 each 2-3x/day   Assessment/Plan    PT Assessment Patient needs continued PT services  PT Problem List Decreased strength;Decreased activity tolerance;Decreased balance;Decreased mobility;Decreased knowledge of use of DME       PT Treatment Interventions DME instruction;Gait training;Stair training;Functional mobility training;Therapeutic activities;Therapeutic exercise;Balance training;Patient/family education    PT Goals (Current goals can be found in the Care Plan section)  Acute Rehab PT Goals Patient Stated Goal: To get stronger PT Goal Formulation: With patient Time For Goal Achievement: 07/14/19 Potential to Achieve Goals: Good    Frequency Min 2X/week   Barriers to discharge        Co-evaluation               AM-PAC PT "6 Clicks" Mobility  Outcome Measure Help needed turning from your back to your side while in a flat bed without using bedrails?: None Help needed moving from lying on your back to sitting on the side of a flat bed without using bedrails?: None Help needed moving to and from a bed to a chair (including a wheelchair)?: A Little Help needed standing up from a chair using your arms (e.g., wheelchair or bedside chair)?: A Little Help needed to walk in hospital room?: A Little Help needed climbing 3-5 steps with a railing? : A Lot 6 Click Score: 19    End of Session Equipment Utilized During Treatment: Gait belt Activity Tolerance: Treatment limited secondary to medical complications (Comment) (Amb limited by dizziness with BP 88/65) Patient left: in bed;with call bell/phone within reach;with family/visitor present;Other (comment) (B bed rails up as pt was found in ER) Nurse Communication: Mobility status PT Visit  Diagnosis: Muscle weakness (generalized) (M62.81);Difficulty in walking, not elsewhere classified (R26.2)    Time: 6144-3154 PT Time Calculation (min) (ACUTE ONLY): 27 min   Charges:   PT Evaluation $PT Eval Moderate Complexity: 1 Mod PT Treatments $Therapeutic Exercise: 8-22 mins        D. Royetta Asal PT, DPT 07/01/19, 1:59 PM

## 2019-07-01 NOTE — ED Notes (Signed)
Pt had another bloody BM, mod amount of blood noted

## 2019-07-01 NOTE — ED Notes (Signed)
Pt up to bathroom, mod amt of bloody stool noted in commode.

## 2019-07-01 NOTE — ED Notes (Signed)
Attempted calling CCU to give report. RN states they are still dealing with critical case and given this RNs phone number to call back.

## 2019-07-01 NOTE — Progress Notes (Signed)
PROGRESS NOTE    [name]  OHY:[id number] DOB: 09-01-1933 DOA: 06/30/2019 PCP: Ronnell Freshwater, NP    Assessment & Plan:   Active Problems:   Essential hypertension   Chronic kidney disease   Type 2 diabetes mellitus with stage 3 chronic kidney disease, without long-term current use of insulin (HCC)   Hyperlipidemia, mixed   OSA (obstructive sleep apnea)   Paroxysmal A-fib ([place])   Uncontrolled type 2 diabetes mellitus with hyperglycemia (HCC)   Chronic systolic heart failure (HCC)   Lower GI bleed    [name] is a 86 y.o. AA male with medical history significant of DM2, CKD, atrial fibrillation on Eliquis, CHF, hypertension, kidney stones, constipation, diverticulitis who presented with bloody BM's.   Patient have had numerous ER visits last week on 30 May he had chest pain or shortness of breath and presented to emergency department he was worked up for PE and coronary artery syndrome at the time and was negative he was nonetheless diagnosed with constipation and rectal exam was done at that time and attempt to disimpact.  He was prescribed bowel regimen thereafter.  Patient had recurrent episodes of rectal pain as well as sometimes sensation of bladder being full.  Wife states that today prior to starting to bleeding he had a BM which was well formed. But then he had a dark bloody stool x1 at home and as well as 1 more in emergency department   . Lower GI bleed  --Multiple episodes of passing blood clots per rectum.  Hgb has been stable and normal ~14.  No fever and abdominal pain. PLAN: --admit to stepdown  --type and crossed --bowel prep for colonoscopy tomorrow --H&H q6h --If bleeding becomes heavy, then will obtain tagged RBC nuclear scan and IR for embolization. --IVF to maintain systolic >948.  . Type 2 diabetes mellitus with stage 3 chronic kidney disease -  - Order Sensitive   SSI   -  check TSH and HgA1C  - Hold home medications   . Chronic  kidney disease -stage IIIb creatinine slightly above baseline  hold diuretics    . Essential hypertension  hold Coreg given GI bleed  . Hyperlipidemia, mixed  -chronic stable continue home medication  . OSA (obstructive sleep apnea)  -continue CPAP  . Paroxysmal A-fib (HCC)  -hold Eliquis given GI bleed Hold Coreg  DM2 --hold home oral agent --BG q4h and SSI   DVT prophylaxis: SCD/Compression stockings Code Status: Full code  Family Communication: wife updated at bedside today Status is: inpatient Dispo:   The patient is from: home Anticipated d/c is to: home Anticipated d/c date is: 2-3 days Patient currently is not medically stable to d/c due to: active lower GI bleed, need colonoscopy and H&H monitor.   Subjective and Interval History:  Pt reported passing chunks of blood clots per rectum, 3-4 episodes overnight.  No abdominal pain except for when he was passing bowel movement.  No fever, dyspnea, chest pain, N/V/D.    Hgb has been stable and normal.   Objective: Vitals:   07/01/19 1315 07/01/19 1601 07/01/19 1611 07/01/19 1700  BP: (!) 88/65 (!) 87/63  109/70  Pulse:   70 72  Resp:      Temp:      TempSrc:      SpO2:   95% 96%  Weight:      Height:        Intake/Output Summary (Last 24 hours) at 07/01/2019 1804 Last data  filed at 07/01/2019 1802 Gross per 24 hour  Intake 1000 ml  Output -  Net 1000 ml   Filed Weights   06/30/19 1617  Weight: 108.9 kg    Examination:   Constitutional: NAD, AAOx3 HEENT: conjunctivae and lids normal, EOMI CV: RRR no M,R,G. Distal pulses +2.  No cyanosis.   RESP: CTA B/L, normal respiratory effort  GI: +BS, NTND Extremities: No effusions, edema, or tenderness in BLE SKIN: warm, dry and intact Neuro: II - XII grossly intact.  Sensation intact Psych: Anxious mood and affect.     Data Reviewed: I have personally reviewed following labs and imaging studies  CBC: Recent Labs  Lab 06/30/19 1620 06/30/19  2332 07/01/19 0456 07/01/19 1107 07/01/19 1558  WBC 7.1 7.7 6.5 8.8 8.0  NEUTROABS 4.3  --  3.1  --   --   HGB 15.0 14.6 14.2 14.4 13.4  HCT 46.7 44.9 43.8 45.2 41.5  MCV 92.7 90.7 92.0 93.6 92.4  PLT 197 203 186 206 580   Basic Metabolic Panel: Recent Labs  Lab 06/26/19 0849 06/30/19 1620 07/01/19 0456  NA 141 140 139  K 4.1 4.5 4.0  CL 100 101 103  CO2 29 28 26   GLUCOSE 163* 138* 128*  BUN 51* 52* 49*  CREATININE 2.37* 2.75* 2.45*  CALCIUM 9.6 9.0 9.0  MG  --   --  2.2  PHOS  --   --  3.6   GFR: Estimated Creatinine Clearance: 29.9 mL/min (A) (by C-G formula based on SCr of 2.45 mg/dL (H)). Liver Function Tests: Recent Labs  Lab 06/26/19 0849 06/30/19 1620 07/01/19 0456  AST 27 24 21   ALT 40 35 31  ALKPHOS 106 112 105  BILITOT 1.0 0.9 0.5  PROT 8.3* 7.9 7.4  ALBUMIN 3.6 3.5 3.3*   Recent Labs  Lab 06/26/19 0849 06/30/19 1620  LIPASE 41 51   No results for input(s): AMMONIA in the last 168 hours. Coagulation Profile: No results for input(s): INR, PROTIME in the last 168 hours. Cardiac Enzymes: No results for input(s): CKTOTAL, CKMB, CKMBINDEX, TROPONINI in the last 168 hours. BNP (last 3 results) No results for input(s): PROBNP in the last 8760 hours. HbA1C: Recent Labs    06/30/19 2332  HGBA1C 7.8*   CBG: Recent Labs  Lab 06/30/19 2340 07/01/19 0454 07/01/19 0816 07/01/19 1555  GLUCAP 131* 129* 127* 156*   Lipid Profile: No results for input(s): CHOL, HDL, LDLCALC, TRIG, CHOLHDL, LDLDIRECT in the last 72 hours. Thyroid Function Tests: Recent Labs    07/01/19 0456  TSH 2.357   Anemia Panel: No results for input(s): VITAMINB12, FOLATE, FERRITIN, TIBC, IRON, RETICCTPCT in the last 72 hours. Sepsis Labs: No results for input(s): PROCALCITON, LATICACIDVEN in the last 168 hours.  Recent Results (from the past 240 hour(s))  SARS Coronavirus 2 by RT PCR (hospital order, performed in [facility] hospital lab) Nasopharyngeal Nasopharyngeal  Swab     Status: None   Collection Time: 06/30/19  7:34 PM   Specimen: Nasopharyngeal Swab  Result Value Ref Range Status   SARS Coronavirus 2 NEGATIVE NEGATIVE Final    Comment: (NOTE) SARS-CoV-2 target nucleic acids are NOT DETECTED. The SARS-CoV-2 RNA is generally detectable in upper and lower respiratory specimens during the acute phase of infection. The lowest concentration of SARS-CoV-2 viral copies this assay can detect is 250 copies / mL. A negative result does not preclude SARS-CoV-2 infection and should not be used as the sole basis for treatment or  other patient management decisions.  A negative result may occur with improper specimen collection / handling, submission of specimen other than nasopharyngeal swab, presence of viral mutation(s) within the areas targeted by this assay, and inadequate number of viral copies (<250 copies / mL). A negative result must be combined with clinical observations, patient history, and epidemiological information. Fact Sheet for Patients:   StrictlyIdeas.no Fact Sheet for Healthcare Providers: BankingDealers.co.za This test is not yet approved or cleared  by the [place] FDA and has been authorized for detection and/or diagnosis of SARS-CoV-2 by FDA under an Emergency Use Authorization (EUA).  This EUA will remain in effect (meaning this test can be used) for the duration of the COVID-19 declaration under Section 564(b)(1) of the Act, 21 U.S.C. section 360bbb-3(b)(1), unless the authorization is terminated or revoked sooner. Performed at [facility], [address], [place], [place] [address]       Radiology Studies: DG Abd 1 View  Result Date: 06/30/2019 CLINICAL DATA:  Constipation. EXAM: ABDOMEN - 1 VIEW COMPARISON:  None. FINDINGS: The bowel gas pattern is normal. A very mild amount of stool is seen within the large bowel. No radio-opaque calculi or other significant  radiographic abnormality are seen. Multiple surgical clips are seen within the lower pelvis. IMPRESSION: Very mild stool burden without evidence of bowel obstruction. Electronically Signed   By: Virgina Norfolk M.D.   On: 06/30/2019 22:37   DG Abd 2 Views  Result Date: 06/30/2019 CLINICAL DATA:  Abdominal pain, rectal bleeding EXAM: ABDOMEN - 2 VIEW COMPARISON:  06/21/2019 FINDINGS: Supine and upright frontal views of the abdomen and pelvis are obtained. Bowel gas pattern is unremarkable. No free gas in the greater peritoneal sac. No masses or abnormal calcifications. Postsurgical changes from prostatectomy. No acute bony abnormalities. IMPRESSION: 1. Unremarkable exam. Electronically Signed   By: Randa Ngo M.D.   On: 06/30/2019 17:38     Scheduled Meds: . atorvastatin  40 mg Oral q1800  . docusate sodium  100 mg Oral BID  . insulin aspart  0-9 Units Subcutaneous Q4H  . magnesium citrate  1 Bottle Oral Once  . pantoprazole  40 mg Oral BID  . polyethylene glycol-electrolytes  4,000 mL Oral Once  . sodium chloride flush  3 mL Intravenous Q12H   Continuous Infusions: . sodium chloride       LOS: 1 day     Enzo Bi, MD [facility] If 7PM-7AM, please contact night-coverage 07/01/2019, 6:04 PM

## 2019-07-01 NOTE — ED Notes (Signed)
Pt up to use bathroom 

## 2019-07-01 NOTE — ED Notes (Signed)
Attempted to call report. Per CCU charge RN they are in the middle of a code and cannot take report at this time.

## 2019-07-01 NOTE — ED Notes (Addendum)
Pt and wife are anxious and asking when GI will be down- informed them that I was not sure when GI would be available to see them- secure message sent to Dr Billie Ruddy to inform her that they are getting upset and feel like nothing is being done since pt is still having bright red stool and leaking blood from his rectum- wife states pt is in pain but when offered pain medication they declined

## 2019-07-01 NOTE — ED Notes (Signed)
Called CCU to check on status of bringing pt to floor. They will call back when staffing available due to emergent situation on floor occurring now.

## 2019-07-01 NOTE — Evaluation (Signed)
Occupational Therapy Evaluation Patient Details Name: [name] MRN: [id number] DOB: 1933/02/14 Today's Date: 07/01/2019    History of Present Illness Pt is an 86 y.o. male with medical history significant of DM2, CKD, COPD, atrial fibrillation, CHF, hypertension, kidney stones, and constipation who presented with large bloody stool at home.  MD assessment includes: Lower GI bleed, chronic systolic heart failure, HTN, OSA, and A-fib.   Clinical Impression   [name] presents to OT with abdominal pain, generalized weakness, and limited endurance that impacts his ability to safely and independently complete functional tasks.  Prior to admission, pt was independent in all ADLs and IADLs including working and driving.  He does not use an AD at baseline.  [name] was extremely limited by pain in today's evaluation.  He reports significant abdominal pain and cramping, as well as frustration that he does not know what his treatment plan is yet.  Pt presents with generalized weakness in BUE, requiring intermittent min A for ADLs.  Deferred OOB mobility assessment 2/2 pt fatigue and low BP, but pt requires supervision assist for functional transfers and ambulation per PT report.  [name] will continue to benefit from skilled OT services in acute setting to address functional strengthening, endurance, safety and independence in ADLs.  Recommend HHOT upon discharge at this time.    Follow Up Recommendations  Home health OT;Supervision - Intermittent    Equipment Recommendations  3 in 1 bedside commode;Tub/shower bench    Recommendations for Other Services       Precautions / Restrictions Precautions Precautions: Fall Restrictions Weight Bearing Restrictions: No      Mobility Bed Mobility Overal bed mobility: Modified Independent             General bed mobility comments: Extra time and effort  Transfers Overall transfer level: Needs assistance Equipment used:  Rolling walker (2 wheeled) Transfers: Sit to/from Stand Sit to Stand: Supervision         General transfer comment: Not tested 2/2 pt fatigue and dizziness, pt requires supervision assist per PT report    Balance Overall balance assessment: Needs assistance   Sitting balance-Leahy Scale: Good     Standing balance support: Bilateral upper extremity supported Standing balance-Leahy Scale: Fair                             ADL either performed or assessed with clinical judgement   ADL Overall ADL's : Needs assistance/impaired                                       General ADL Comments: Pt currently requires intermittent min A for ADLs 2/2 generalized weakness, fatigue, and discomfort from pain.  Deferred functional mobility assessment 2/2 pt fatigue and low BP, but pt requires supervision assist for functional transfers per PT report.     Vision Baseline Vision/History: Wears glasses Wears Glasses: Reading only Patient Visual Report: No change from baseline       Perception     Praxis      Pertinent Vitals/Pain Pain Assessment: Faces Faces Pain Scale: Hurts even more Pain Location: abdomen, stomach cramps Pain Descriptors / Indicators: Cramping;Discomfort;Grimacing;Moaning Pain Intervention(s): Limited activity within patient's tolerance;Monitored during session     Hand Dominance     Extremity/Trunk Assessment Upper Extremity Assessment Upper Extremity Assessment: Generalized weakness   Lower Extremity Assessment  Lower Extremity Assessment: Generalized weakness       Communication Communication Communication: No difficulties   Cognition Arousal/Alertness: Awake/alert Behavior During Therapy: WFL for tasks assessed/performed Overall Cognitive Status: Within Functional Limits for tasks assessed                                 General Comments: grossly oriented, engagement in therapy limited by pain   General  Comments       Exercises Total Joint Exercises Ankle Circles/Pumps: Strengthening;Both;5 reps;10 reps Quad Sets: Strengthening;Both;10 reps Gluteal Sets: Strengthening;Both;10 reps Hip ABduction/ADduction: Strengthening;Both;5 reps Straight Leg Raises: Strengthening;Both;5 reps Long Arc Quad: Strengthening;Both;10 reps Other Exercises Other Exercises: HEP education for BLE APs, QS, LAQs, and GS x 10 each 5-6x/day, BLE hip abd/add x 10 each 2-3x/day Other Exercises: provided education re: OT role and plan of care, fall and safety precautions, self care   Shoulder Instructions      Home Living Family/patient expects to be discharged to:: Private residence Living Arrangements: Spouse/significant other Available Help at Discharge: Family;Available 24 hours/day Type of Home: House Home Access: Stairs to enter [employer] of Steps: 1 Entrance Stairs-Rails: None Home Layout: One level     Bathroom Shower/Tub: Teacher, early years/pre: Standard     Home Equipment: Grab bars - tub/shower          Prior Functioning/Environment Level of Independence: Independent        Comments: Pt is independent in ADLs and IADLs.  He still drives and works.  Pt enjoys watching TV and playing cards.  Denies fall history.        OT Problem List: Decreased strength;Decreased activity tolerance;Impaired balance (sitting and/or standing);Decreased knowledge of use of DME or AE;Pain      OT Treatment/Interventions: Self-care/ADL training;Therapeutic exercise;Energy conservation;DME and/or AE instruction;Therapeutic activities;Patient/family education;Balance training    OT Goals(Current goals can be found in the care plan section) Acute Rehab OT Goals Patient Stated Goal: to feel better OT Goal Formulation: With patient/family Time For Goal Achievement: 07/15/19 Potential to Achieve Goals: Good  OT Frequency: Min 1X/week   Barriers to D/C:            Co-evaluation               AM-PAC OT "6 Clicks" Daily Activity     Outcome Measure Help from another person eating meals?: None Help from another person taking care of personal grooming?: A Little Help from another person toileting, which includes using toliet, bedpan, or urinal?: A Little Help from another person bathing (including washing, rinsing, drying)?: A Little Help from another person to put on and taking off regular upper body clothing?: A Little Help from another person to put on and taking off regular lower body clothing?: A Little 6 Click Score: 19   End of Session    Activity Tolerance: Patient limited by fatigue;Patient limited by pain Patient left: in bed;with call bell/phone within reach;with family/visitor present  OT Visit Diagnosis: Other abnormalities of gait and mobility (R26.89);Muscle weakness (generalized) (M62.81)                Time: 2800-3491 OT Time Calculation (min): 14 min Charges:  OT General Charges $OT Visit: 1 Visit OT Evaluation $OT Eval Moderate Complexity: 1 Mod  Ezana Hubbert Wells Arbie Reisz, OTR/L 07/01/19, 2:55 PM

## 2019-07-01 NOTE — Consult Note (Signed)
Glenn Darby, MD [address]  [place]  [place], [place] [address]  Main: [phone]  Fax: [phone] Pager: [phone]   Consultation  Referring Provider:     No ref. provider found Primary Care Physician:  Ronnell Freshwater, NP Primary Gastroenterologist:  Dr. Sherri Sear         Reason for Consultation:     Rectal bleeding  Date of Admission:  06/30/2019 Date of Consultation:  07/01/2019         HPI:   [name] is a 86 y.o. male history of A. fib on Eliquis, CHF, metabolic syndrome, history of constipation.  Patient reports that he has been suffering from constipation, has been increasing fiber intake.  He had one episode of dark bloody bowel movement at home and 2-3 in the ER.  His hemoglobin remains within normal limits, 14.4 today.  Patient was originally seen by me in 02/2018 for 2 weeks history of rectal blood per rectum.  He had a colonoscopy in [place] in 2015, reportedly normal and he was told that would be his last colonoscopy.  Repeat colonoscopy was deferred at that time. H/o prostate cancer, could not recollect about radiation treatment Last dose of eliquis 6/9 am  NSAIDs: None  Antiplts/Anticoagulants/Anti thrombotics: Eliquis for history of A. fib  GI Procedures: Colonoscopy in 2015 at [facility], reportedly normal  Past Medical History:  Diagnosis Date  . Atrial fibrillation ([place])   . CHF (congestive heart failure) ([place])   . Diabetes ([place])   . Hearing loss   . High blood pressure   . History of bladder problems   . Kidney stones 11/22/2014    Past Surgical History:  Procedure Laterality Date  . CATARACT EXTRACTION    . PROSTATE CANCER    . PROSTATE SURGERY      Prior to Admission medications   Medication Sig Start Date End Date Taking? Authorizing Provider  atorvastatin (LIPITOR) 40 MG tablet Take 1 tablet (40 mg total) by mouth daily at 6 PM. 05/17/19  Yes Boscia, Greer Ee, NP  carvedilol (COREG) 25 MG tablet Take 25 mg by mouth 2  (two) times daily with a meal.  02/15/19  Yes [provider]  ELIQUIS 2.5 MG TABS tablet Take 2.5 mg by mouth 2 (two) times daily. 02/15/19  Yes [provider]  pantoprazole (PROTONIX) 40 MG tablet TAKE 1 TABLET(40 MG) BY MOUTH DAILY 03/23/19  Yes Boscia, Heather E, NP  torsemide (DEMADEX) 20 MG tablet Take 20 mg by mouth daily. 05/24/19  Yes [provider]  albuterol (VENTOLIN HFA) 108 (90 Base) MCG/ACT inhaler Inhale 2 puffs into the lungs every 4 (four) hours as needed for wheezing or shortness of breath. 06/28/19   Ronnell Freshwater, NP  docusate sodium (COLACE) 100 MG capsule Take 1 capsule (100 mg total) by mouth 2 (two) times daily. 06/21/19 06/20/20  Blake Divine, MD  glucose blood (ONETOUCH VERIO) test strip USE ONCE DAILY. DX E11.65. 03/15/19   Ronnell Freshwater, NP  sitaGLIPtin (JANUVIA) 25 MG tablet Take 1 tablet (25 mg total) by mouth daily. 06/29/19   Ronnell Freshwater, NP   Current Facility-Administered Medications:  .  0.9 %  sodium chloride infusion, 250 mL, Intravenous, PRN, Doutova, Anastassia, MD .  acetaminophen (TYLENOL) tablet 650 mg, 650 mg, Oral, Q6H PRN **OR** acetaminophen (TYLENOL) suppository 650 mg, 650 mg, Rectal, Q6H PRN, Doutova, Anastassia, MD .  albuterol (PROVENTIL) (2.5 MG/3ML) 0.083% nebulizer solution 2.5  mg, 2.5 mg, Nebulization, Q2H PRN, Doutova, Anastassia, MD .  albuterol (PROVENTIL) (2.5 MG/3ML) 0.083% nebulizer solution 3 mL, 3 mL, Inhalation, Q4H PRN, Doutova, Anastassia, MD .  atorvastatin (LIPITOR) tablet 40 mg, 40 mg, Oral, q1800, Doutova, Anastassia, MD .  docusate sodium (COLACE) capsule 100 mg, 100 mg, Oral, BID, Doutova, Anastassia, MD .  HYDROcodone-acetaminophen (NORCO/VICODIN) 5-325 MG per tablet 1-2 tablet, 1-2 tablet, Oral, Q4H PRN, Doutova, Anastassia, MD .  insulin aspart (novoLOG) injection 0-9 Units, 0-9 Units, Subcutaneous, Q4H, Doutova, Anastassia, MD, 1 Units at 07/01/19 1058 .  magnesium citrate solution 1 Bottle,  1 Bottle, Oral, Once, Jasiel Belisle, Tally Due, MD .  ondansetron (ZOFRAN) tablet 4 mg, 4 mg, Oral, Q6H PRN **OR** ondansetron (ZOFRAN) injection 4 mg, 4 mg, Intravenous, Q6H PRN, Doutova, Anastassia, MD .  pantoprazole (PROTONIX) EC tablet 40 mg, 40 mg, Oral, BID, Doutova, Anastassia, MD, 40 mg at 07/01/19 1057 .  polyethylene glycol-electrolytes (NuLYTELY) solution 4,000 mL, 4,000 mL, Oral, Once, Deryn Massengale, Tally Due, MD .  sodium chloride flush (NS) 0.9 % injection 3 mL, 3 mL, Intravenous, Q12H, Doutova, Anastassia, MD .  sodium chloride flush (NS) 0.9 % injection 3 mL, 3 mL, Intravenous, PRN, Doutova, Anastassia, MD  Current Outpatient Medications:  .  atorvastatin (LIPITOR) 40 MG tablet, Take 1 tablet (40 mg total) by mouth daily at 6 PM., Disp: 30 tablet, Rfl: 5 .  carvedilol (COREG) 25 MG tablet, Take 25 mg by mouth 2 (two) times daily with a meal. , Disp: , Rfl:  .  ELIQUIS 2.5 MG TABS tablet, Take 2.5 mg by mouth 2 (two) times daily., Disp: , Rfl:  .  pantoprazole (PROTONIX) 40 MG tablet, TAKE 1 TABLET(40 MG) BY MOUTH DAILY, Disp: 30 tablet, Rfl: 6 .  torsemide (DEMADEX) 20 MG tablet, Take 20 mg by mouth daily., Disp: , Rfl:  .  albuterol (VENTOLIN HFA) 108 (90 Base) MCG/ACT inhaler, Inhale 2 puffs into the lungs every 4 (four) hours as needed for wheezing or shortness of breath., Disp: 18 g, Rfl: 1 .  docusate sodium (COLACE) 100 MG capsule, Take 1 capsule (100 mg total) by mouth 2 (two) times daily., Disp: 60 capsule, Rfl: 2 .  glucose blood (ONETOUCH VERIO) test strip, USE ONCE DAILY. DX E11.65., Disp: 100 each, Rfl: 3 .  sitaGLIPtin (JANUVIA) 25 MG tablet, Take 1 tablet (25 mg total) by mouth daily., Disp: 30 tablet, Rfl: 3   Family History  Problem Relation Age of Onset  . Colon cancer Mother   . Diabetes Other   . High blood pressure Other   . Prostate cancer Brother   . Diabetes Brother      Social History   Tobacco Use  . Smoking status: Former Smoker    Years: 16.00     Types: Cigarettes  . Smokeless tobacco: Never Used  Vaping Use  . Vaping Use: Never used  Substance Use Topics  . Alcohol use: No  . Drug use: No    Allergies as of 06/30/2019  . (No Known Allergies)    Review of Systems:    All systems reviewed and negative except where noted in HPI.   Physical Exam:  Vital signs in last 24 hours: Temp:  [98.1 F (36.7 C)] 98.1 F (36.7 C) (06/09 1616) Pulse Rate:  [46-116] 71 (06/10 0753) Resp:  [17-24] 18 (06/10 0753) BP: (88-138)/(58-92) 88/65 (06/10 1315) SpO2:  [93 %-100 %] 97 % (06/10 0753) Weight:  [108.9 kg] 108.9 kg (06/09 1617)  General:   Pleasant, cooperative in NAD Head:  Normocephalic and atraumatic. Eyes:   No icterus.   Conjunctiva pink. PERRLA. Ears:  Normal auditory acuity. Neck:  Supple; no masses or thyroidomegaly Lungs: Respirations even and unlabored. Lungs clear to auscultation bilaterally.   No wheezes, crackles, or rhonchi.  Heart:  Regular rate and rhythm;  Without murmur, clicks, rubs or gallops Abdomen:  Soft, nondistended, nontender. Normal bowel sounds. No appreciable masses or hepatomegaly.  No rebound or guarding.  Rectal:  Not performed. Msk:  Symmetrical without gross deformities.  Strength normal Extremities:  Without edema, cyanosis or clubbing. Neurologic:  Alert and oriented x3;  grossly normal neurologically. Skin:  Intact without significant lesions or rashes. Psych:  Alert and cooperative. Normal affect.  LAB RESULTS: CBC Latest Ref Rng & Units 07/01/2019 07/01/2019 06/30/2019  WBC 4.0 - 10.5 K/uL 8.8 6.5 7.7  Hemoglobin 13.0 - 17.0 g/dL 14.4 14.2 14.6  Hematocrit 39 - 52 % 45.2 43.8 44.9  Platelets 150 - 400 K/uL 206 186 203    BMET BMP Latest Ref Rng & Units 07/01/2019 06/30/2019 06/26/2019  Glucose 70 - 99 mg/dL 128(H) 138(H) 163(H)  BUN 8 - 23 mg/dL 49(H) 52(H) 51(H)  Creatinine 0.61 - 1.24 mg/dL 2.45(H) 2.75(H) 2.37(H)  BUN/Creat Ratio 10 - 24 - - -  Sodium 135 - 145 mmol/L 139 140 141   Potassium 3.5 - 5.1 mmol/L 4.0 4.5 4.1  Chloride 98 - 111 mmol/L 103 101 100  CO2 22 - 32 mmol/L _0 Calcium 8.9 - 10.3 mg/dL 9.0 9.0 9.6    LFT Hepatic Function Latest Ref Rng & Units 07/01/2019 06/30/2019 06/26/2019  Total Protein 6.5 - 8.1 g/dL 7.4 7.9 8.3(H)  Albumin 3.5 - 5.0 g/dL 3.3(L) 3.5 3.6  AST 15 - 41 U/L _1 ALT 0 - 44 U/L 31 35 40  Alk Phosphatase 38 - 126 U/L 105 112 106  Total Bilirubin 0.3 - 1.2 mg/dL 0.5 0.9 1.0     STUDIES: DG Abd 1 View  Result Date: 06/30/2019 CLINICAL DATA:  Constipation. EXAM: ABDOMEN - 1 VIEW COMPARISON:  None. FINDINGS: The bowel gas pattern is normal. A very mild amount of stool is seen within the large bowel. No radio-opaque calculi or other significant radiographic abnormality are seen. Multiple surgical clips are seen within the lower pelvis. IMPRESSION: Very mild stool burden without evidence of bowel obstruction. Electronically Signed   By: Virgina Norfolk M.D.   On: 06/30/2019 22:37   DG Abd 2 Views  Result Date: 06/30/2019 CLINICAL DATA:  Abdominal pain, rectal bleeding EXAM: ABDOMEN - 2 VIEW COMPARISON:  06/21/2019 FINDINGS: Supine and upright frontal views of the abdomen and pelvis are obtained. Bowel gas pattern is unremarkable. No free gas in the greater peritoneal sac. No masses or abnormal calcifications. Postsurgical changes from prostatectomy. No acute bony abnormalities. IMPRESSION: 1. Unremarkable exam. Electronically Signed   By: Randa Ngo M.D.   On: 06/30/2019 17:38      Impression / Plan:   [name] is a 86 y.o. male with history of A. fib on Eliquis, CHF, chronic intermittent constipation, intermittent bright red blood per rectum, presented with recurrent episodes of painless rectal bleeding, hemodynamically insignificant.  Hemoglobin is stable and normal.  Radiation proctitis or hemorrhoids or bleeding from stercoral ulcer or very less likely diverticular bleed Patient said he would like to know  the cause of rectal bleeding and willing to undergo colonoscopy Clear liquid diet  NPO past midnight Colonoscopy tomorrow if his prep is clean Continue to hold eliquis  Thank you for involving me in the care of this patient.      LOS: 1 day   Sherri Sear, MD  07/01/2019, 2:43 PM   Note: This dictation was prepared with Dragon dictation along with smaller phrase technology. Any transcriptional errors that result from this process are unintentional.

## 2019-07-01 NOTE — ED Notes (Signed)
Assigned bed @ 1828, Spoke with [employer].

## 2019-07-01 NOTE — ED Notes (Signed)
Dr. Lai at bedside

## 2019-07-01 NOTE — ED Notes (Signed)
Pt up to bedside commode. Pt having episodes of passing dark red blood with stool. Pt hitting bedside commode repeatedly and shouting words this RN cannot understand. Pt reports "my neck hurts, my stomach is cramping like hell from that stuff I drank and y'all want me to keep drinking it." RN notified NP Randol Kern.

## 2019-07-02 ENCOUNTER — Encounter: Payer: Self-pay | Admitting: Hospitalist

## 2019-07-02 ENCOUNTER — Inpatient Hospital Stay: Payer: Medicare Other | Admitting: Anesthesiology

## 2019-07-02 ENCOUNTER — Ambulatory Visit: Payer: Medicare Other | Admitting: Family

## 2019-07-02 ENCOUNTER — Encounter
Admission: EM | Disposition: A | Discharge status: Other Acute Inpatient Hospital | Payer: Self-pay | Source: Home / Self Care | Attending: Hospitalist

## 2019-07-02 HISTORY — PX: COLONOSCOPY WITH PROPOFOL: SHX5780

## 2019-07-02 LAB — GLUCOSE, CAPILLARY
Glucose-Capillary: 131 mg/dL — ABNORMAL HIGH (ref 70–99)
Glucose-Capillary: 148 mg/dL — ABNORMAL HIGH (ref 70–99)
Glucose-Capillary: 163 mg/dL — ABNORMAL HIGH (ref 70–99)
Glucose-Capillary: 164 mg/dL — ABNORMAL HIGH (ref 70–99)
Glucose-Capillary: 78 mg/dL (ref 70–99)
Glucose-Capillary: 83 mg/dL (ref 70–99)

## 2019-07-02 LAB — HEMOGLOBIN AND HEMATOCRIT, BLOOD
HCT: 39.3 % (ref 39.0–52.0)
Hemoglobin: 12.5 g/dL — ABNORMAL LOW (ref 13.0–17.0)

## 2019-07-02 LAB — BASIC METABOLIC PANEL
Anion gap: 8 (ref 5–15)
BUN: 48 mg/dL — ABNORMAL HIGH (ref 8–23)
CO2: 28 mmol/L (ref 22–32)
Calcium: 8.7 mg/dL — ABNORMAL LOW (ref 8.9–10.3)
Chloride: 102 mmol/L (ref 98–111)
Creatinine, Ser: 2.73 mg/dL — ABNORMAL HIGH (ref 0.61–1.24)
GFR calc Af Amer: 24 mL/min — ABNORMAL LOW (ref >60)
GFR calc non Af Amer: 20 mL/min — ABNORMAL LOW (ref >60)
Glucose, Bld: 152 mg/dL — ABNORMAL HIGH (ref 70–99)
Potassium: 4.8 mmol/L (ref 3.5–5.1)
Sodium: 138 mmol/L (ref 135–145)

## 2019-07-02 LAB — MAGNESIUM: Magnesium: 2.6 mg/dL — ABNORMAL HIGH (ref 1.7–2.4)

## 2019-07-02 LAB — CBC
HCT: 38 % — ABNORMAL LOW (ref 39.0–52.0)
Hemoglobin: 12.6 g/dL — ABNORMAL LOW (ref 13.0–17.0)
MCH: 30.2 pg (ref 26.0–34.0)
MCHC: 33.2 g/dL (ref 30.0–36.0)
MCV: 91.1 fL (ref 80.0–100.0)
Platelets: 194 10*3/uL (ref 150–400)
RBC: 4.17 MIL/uL — ABNORMAL LOW (ref 4.22–5.81)
RDW: 13.5 % (ref 11.5–15.5)
WBC: 7.8 10*3/uL (ref 4.0–10.5)
nRBC: 0 % (ref 0.0–0.2)

## 2019-07-02 SURGERY — COLONOSCOPY WITH PROPOFOL
Anesthesia: General

## 2019-07-02 MED ORDER — PROPOFOL 10 MG/ML IV BOLUS
INTRAVENOUS | Status: DC | PRN
  Administered 2019-07-02: 40 mg via INTRAVENOUS

## 2019-07-02 MED ORDER — PROPOFOL 500 MG/50ML IV EMUL
INTRAVENOUS | Status: DC | PRN
  Administered 2019-07-02: 150 ug/kg/min via INTRAVENOUS

## 2019-07-02 MED ORDER — PHENYLEPHRINE HCL (PRESSORS) 10 MG/ML IV SOLN
INTRAVENOUS | Status: DC | PRN
  Administered 2019-07-02 (×3): 200 ug via INTRAVENOUS
  Administered 2019-07-02: 100 ug via INTRAVENOUS
  Administered 2019-07-02: 200 ug via INTRAVENOUS

## 2019-07-02 MED ORDER — POLYETHYLENE GLYCOL 3350 17 G PO PACK
17.0000 g | PACK | Freq: Two times a day (BID) | ORAL | Status: DC
  Administered 2019-07-02 – 2019-07-03 (×3): 17 g via ORAL
  Filled 2019-07-02 (×3): qty 1

## 2019-07-02 MED ORDER — LIDOCAINE HCL (CARDIAC) PF 100 MG/5ML IV SOSY
PREFILLED_SYRINGE | INTRAVENOUS | Status: DC | PRN
  Administered 2019-07-02: 50 mg via INTRAVENOUS

## 2019-07-02 MED ORDER — SODIUM CHLORIDE 0.9 % IV SOLN: INTRAVENOUS | Status: DC

## 2019-07-02 NOTE — Progress Notes (Signed)
Cpap order noted. Per, RN patient prepping for colonoscopy. Will be up and down all night on bedside commode.  Will supply unit once procedure is done.

## 2019-07-02 NOTE — Progress Notes (Signed)
Colonoscopy post procedure note  Actively bleeding rectal ulcer as well as visible vessel in the distal rectum The ulcer was occupying almost entire circumference of the distal rectum 5 cc epi injected to the ulcer and hemostatic spray delivered There was no bleeding at the end of procedure Rest of the colon was normal  Rectal ulcer likely stercoral ulcer in setting of chronic constipation  Recs: Resume diet Recommend to continue to hold Eliquis until ulcer is healed which may take about 4 weeks if okay with cardiology Avoid constipation Recommend regular bowel regimen with MiraLAX 17 g 1-2 times daily and Metamucil 1-2 times daily to make sure bowel movements are soft Avoid significant straining Follow-up with GI as outpatient Dr. Bonna Gains will cover for the weekend  Cephas Darby, MD [address]  [place]  [place], [place] [address]  Main: [phone]  Fax: [phone] Pager: [phone]

## 2019-07-02 NOTE — Anesthesia Procedure Notes (Signed)
Date/Time: 07/02/2019 12:52 PM Performed by: Johnna Acosta, CRNA Pre-anesthesia Checklist: Patient identified, Emergency Drugs available, Suction available, Patient being monitored and Timeout performed Patient Re-evaluated:Patient Re-evaluated prior to induction Oxygen Delivery Method: Nasal cannula Preoxygenation: Pre-oxygenation with 100% oxygen Induction Type: IV induction

## 2019-07-02 NOTE — Progress Notes (Signed)
PROGRESS NOTE    [name]  WRU:[id number] DOB: 12-22-1933 DOA: 06/30/2019 PCP: Ronnell Freshwater, NP    Assessment & Plan:   Active Problems:   Essential hypertension   Chronic kidney disease   Type 2 diabetes mellitus with stage 3 chronic kidney disease, without long-term current use of insulin (HCC)   Hyperlipidemia, mixed   OSA (obstructive sleep apnea)   Paroxysmal A-fib ([place])   Uncontrolled type 2 diabetes mellitus with hyperglycemia (HCC)   Chronic systolic heart failure (HCC)   Lower GI bleed    [name] is a 86 y.o. AA male with medical history significant of DM2, CKD, atrial fibrillation on Eliquis, CHF, hypertension, kidney stones, constipation, diverticulitis who presented with bloody BM's.   Patient have had numerous ER visits last week on 30 May he had chest pain or shortness of breath and presented to emergency department he was worked up for PE and coronary artery syndrome at the time and was negative he was nonetheless diagnosed with constipation and rectal exam was done at that time and attempt to disimpact.  He was prescribed bowel regimen thereafter.  Patient had recurrent episodes of rectal pain as well as sometimes sensation of bladder being full.  Wife states that today prior to starting to bleeding he had a BM which was well formed. But then he had a dark bloody stool x1 at home and as well as 1 more in emergency department   # Lower GI bleed 2/2 bleeding rectal ulcer # Rectal ulcer likely stercoral ulcer in setting of chronic constipation --Multiple episodes of passing blood clots per rectum.  Hgb has been stable and normal ~14.  No fever and abdominal pain. PLAN: --colonoscopy today, found "Actively bleeding rectal ulcer as well as visible vessel in the distal rectum The ulcer was occupying almost entire circumference of the distal rectum 5 cc epi injected to the ulcer and hemostatic spray delivered." --Miralax BID to keep stool soft serve  consistency --continue to hold Eliquis until ulcer is healed which may take about 4 weeks  --Follow-up with GI as outpatient  . Chronic kidney disease -stage IIIb creatinine slightly above baseline  hold diuretics    . Essential hypertension  hold Coreg given GI bleed  . Hyperlipidemia, mixed  -chronic stable continue home medication  . OSA (obstructive sleep apnea)  -continue CPAP  . Paroxysmal A-fib (HCC)  -hold Eliquis given GI bleed Hold Coreg  DM2 --hold home oral agent --BG q4h and SSI   DVT prophylaxis: SCD/Compression stockings Code Status: Full code  Family Communication: wife updated at bedside today Status is: inpatient Dispo:   The patient is from: home Anticipated d/c is to: home Anticipated d/c date is: tomorrow Patient currently is not medically stable to d/c due to:  Will monitor overnight to ensure no further rectal bleeding   Subjective and Interval History:  Pt complained of severe pain with defecation.  Continued to pass blood clots.  Hgb remained stable.  No fever, dyspnea, N/V.  Pt went for colonoscopy and was found to have "Actively bleeding rectal ulcer as well as visible vessel in the distal rectum."     Objective: Vitals:   07/02/19 1419 07/02/19 1420 07/02/19 1500 07/02/19 1504  BP: 100/72  125/67 113/68  Pulse: (!) 116 (!) 116 61 (!) 107  Resp: 19  18 20   Temp:    97.6 F (36.4 C)  TempSrc:    Oral  SpO2: 98% 98% 92% 100%  Weight:  Height:        Intake/Output Summary (Last 24 hours) at 07/02/2019 1757 Last data filed at 07/02/2019 1337 Gross per 24 hour  Intake 1400 ml  Output --  Net 1400 ml   Filed Weights   06/30/19 1617 07/02/19 1233  Weight: 108.9 kg 108.9 kg    Examination:   Constitutional: NAD, AAOx3 HEENT: conjunctivae and lids normal, EOMI CV: RRR no M,R,G. Distal pulses +2.  No cyanosis.   RESP: CTA B/L, normal respiratory effort  GI: +BS, NTND Extremities: No effusions, edema, or tenderness in  BLE SKIN: warm, dry and intact Neuro: II - XII grossly intact.  Sensation intact Psych: better mood and affect.     Data Reviewed: I have personally reviewed following labs and imaging studies  CBC: Recent Labs  Lab 06/30/19 1620 06/30/19 1620 06/30/19 2332 06/30/19 2332 07/01/19 0456 07/01/19 1107 07/01/19 1558 07/02/19 0446 07/02/19 1510  WBC 7.1   < > 7.7  --  6.5 8.8 8.0  --  7.8  NEUTROABS 4.3  --   --   --  3.1  --   --   --   --   HGB 15.0   < > 14.6   < > 14.2 14.4 13.4 12.5* 12.6*  HCT 46.7   < > 44.9   < > 43.8 45.2 41.5 39.3 38.0*  MCV 92.7   < > 90.7  --  92.0 93.6 92.4  --  91.1  PLT 197   < > 203  --  186 206 196  --  194   < > = values in this interval not displayed.   Basic Metabolic Panel: Recent Labs  Lab 06/26/19 0849 06/30/19 1620 07/01/19 0456 07/02/19 0446  NA 141 140 139 138  K 4.1 4.5 4.0 4.8  CL 100 101 103 102  CO2 29 28 26 28   GLUCOSE 163* 138* 128* 152*  BUN 51* 52* 49* 48*  CREATININE 2.37* 2.75* 2.45* 2.73*  CALCIUM 9.6 9.0 9.0 8.7*  MG  --   --  2.2 2.6*  PHOS  --   --  3.6  --    GFR: Estimated Creatinine Clearance: 26.9 mL/min (A) (by C-G formula based on SCr of 2.73 mg/dL (H)). Liver Function Tests: Recent Labs  Lab 06/26/19 0849 06/30/19 1620 07/01/19 0456  AST 27 24 21   ALT 40 35 31  ALKPHOS 106 112 105  BILITOT 1.0 0.9 0.5  PROT 8.3* 7.9 7.4  ALBUMIN 3.6 3.5 3.3*   Recent Labs  Lab 06/26/19 0849 06/30/19 1620  LIPASE 41 51   No results for input(s): AMMONIA in the last 168 hours. Coagulation Profile: No results for input(s): INR, PROTIME in the last 168 hours. Cardiac Enzymes: No results for input(s): CKTOTAL, CKMB, CKMBINDEX, TROPONINI in the last 168 hours. BNP (last 3 results) No results for input(s): PROBNP in the last 8760 hours. HbA1C: Recent Labs    06/30/19 2332  HGBA1C 7.8*   CBG: Recent Labs  Lab 07/01/19 2028 07/02/19 0242 07/02/19 0445 07/02/19 1237 07/02/19 1729  GLUCAP 94 148*  164* 83 131*   Lipid Profile: No results for input(s): CHOL, HDL, LDLCALC, TRIG, CHOLHDL, LDLDIRECT in the last 72 hours. Thyroid Function Tests: Recent Labs    07/01/19 0456  TSH 2.357   Anemia Panel: No results for input(s): VITAMINB12, FOLATE, FERRITIN, TIBC, IRON, RETICCTPCT in the last 72 hours. Sepsis Labs: No results for input(s): PROCALCITON, LATICACIDVEN in the last 168 hours.  Recent  Results (from the past 240 hour(s))  SARS Coronavirus 2 by RT PCR (hospital order, performed in [facility] hospital lab) Nasopharyngeal Nasopharyngeal Swab     Status: None   Collection Time: 06/30/19  7:34 PM   Specimen: Nasopharyngeal Swab  Result Value Ref Range Status   SARS Coronavirus 2 NEGATIVE NEGATIVE Final    Comment: (NOTE) SARS-CoV-2 target nucleic acids are NOT DETECTED. The SARS-CoV-2 RNA is generally detectable in upper and lower respiratory specimens during the acute phase of infection. The lowest concentration of SARS-CoV-2 viral copies this assay can detect is 250 copies / mL. A negative result does not preclude SARS-CoV-2 infection and should not be used as the sole basis for treatment or other patient management decisions.  A negative result may occur with improper specimen collection / handling, submission of specimen other than nasopharyngeal swab, presence of viral mutation(s) within the areas targeted by this assay, and inadequate number of viral copies (<250 copies / mL). A negative result must be combined with clinical observations, patient history, and epidemiological information. Fact Sheet for Patients:   StrictlyIdeas.no Fact Sheet for Healthcare Providers: BankingDealers.co.za This test is not yet approved or cleared  by the [place] FDA and has been authorized for detection and/or diagnosis of SARS-CoV-2 by FDA under an Emergency Use Authorization (EUA).  This EUA will remain in effect (meaning this test  can be used) for the duration of the COVID-19 declaration under Section 564(b)(1) of the Act, 21 U.S.C. section 360bbb-3(b)(1), unless the authorization is terminated or revoked sooner. Performed at [facility], [address], [place], [place] [address]       Radiology Studies: DG Abd 1 View  Result Date: 06/30/2019 CLINICAL DATA:  Constipation. EXAM: ABDOMEN - 1 VIEW COMPARISON:  None. FINDINGS: The bowel gas pattern is normal. A very mild amount of stool is seen within the large bowel. No radio-opaque calculi or other significant radiographic abnormality are seen. Multiple surgical clips are seen within the lower pelvis. IMPRESSION: Very mild stool burden without evidence of bowel obstruction. Electronically Signed   By: Virgina Norfolk M.D.   On: 06/30/2019 22:37     Scheduled Meds: . atorvastatin  40 mg Oral q1800  . docusate sodium  100 mg Oral BID  . insulin aspart  0-9 Units Subcutaneous Q4H  . pantoprazole  40 mg Oral BID  . polyethylene glycol  17 g Oral BID WC  . sodium chloride flush  3 mL Intravenous Q12H   Continuous Infusions: . sodium chloride       LOS: 2 days     Enzo Bi, MD [facility] If 7PM-7AM, please contact night-coverage 07/02/2019, 5:57 PM

## 2019-07-02 NOTE — ED Notes (Signed)
PT to Endo at this time.

## 2019-07-02 NOTE — Anesthesia Preprocedure Evaluation (Signed)
Anesthesia Evaluation  Patient identified by MRN, date of birth, ID band Patient awake    Reviewed: Allergy & Precautions, H&P , NPO status , Patient's Chart, lab work & pertinent test results, reviewed documented beta blocker date and time   Airway Mallampati: II   Neck ROM: full    Dental  (+) Poor Dentition   Pulmonary sleep apnea , COPD, former smoker,    Pulmonary exam normal        Cardiovascular Exercise Tolerance: Poor hypertension, On Medications +CHF  Normal cardiovascular exam Rhythm:regular Rate:Normal     Neuro/Psych negative neurological ROS  negative psych ROS   GI/Hepatic negative GI ROS, Neg liver ROS,   Endo/Other  negative endocrine ROSdiabetes  Renal/GU Renal disease  negative genitourinary   Musculoskeletal   Abdominal   Peds  Hematology negative hematology ROS (+)   Anesthesia Other Findings Past Medical History: No date: Atrial fibrillation (HCC) No date: CHF (congestive heart failure) (HCC) No date: Diabetes ([place]) No date: Hearing loss No date: High blood pressure No date: History of bladder problems 11/22/2014: Kidney stones Past Surgical History: No date: CATARACT EXTRACTION No date: PROSTATE CANCER No date: PROSTATE SURGERY BMI    Body Mass Index: 30.00 kg/m     Reproductive/Obstetrics negative OB ROS                             Anesthesia Physical Anesthesia Plan  ASA: III  Anesthesia Plan: General   Post-op Pain Management:    Induction:   PONV Risk Score and Plan:   Airway Management Planned:   Additional Equipment:   Intra-op Plan:   Post-operative Plan:   Informed Consent: I have reviewed the patients History and Physical, chart, labs and discussed the procedure including the risks, benefits and alternatives for the proposed anesthesia with the patient or authorized representative who has indicated his/her understanding and  acceptance.     Dental Advisory Given  Plan Discussed with: CRNA  Anesthesia Plan Comments:         Anesthesia Quick Evaluation

## 2019-07-02 NOTE — Transfer of Care (Signed)
Immediate Anesthesia Transfer of Care Note  Patient: [name]  Procedure(s) Performed: COLONOSCOPY WITH PROPOFOL (N/A )  Patient Location: PACU  Anesthesia Type:General  Level of Consciousness: sedated  Airway & Oxygen Therapy: Patient Spontanous Breathing  Post-op Assessment: Report given to RN and Post -op Vital signs reviewed and stable  Post vital signs: Reviewed and stable  Last Vitals:  Vitals Value Taken Time  BP 106/73 07/02/19 1347  Temp 36 C 07/02/19 1345  Pulse 105 07/02/19 1347  Resp 24 07/02/19 1347  SpO2 98 % 07/02/19 1347    Last Pain:  Vitals:   07/02/19 1345  TempSrc: Temporal  PainSc:          Complications: No complications documented.

## 2019-07-02 NOTE — Progress Notes (Signed)
Called by Secretary, pt no longer wants to wear CPAP. Pt wears nasal pillows at home and not tolerating nasal mask.

## 2019-07-02 NOTE — Op Note (Signed)
[facility] Gastroenterology Patient Name: Luisdavid Hamblin Procedure Date: 07/02/2019 12:07 PM MRN: [id number] Account #: [id number] Date of Birth: [birth year] Admit Type: Outpatient Age: 86 Room: [facility] ENDO ROOM 3 Gender: Male Note Status: Finalized Procedure:             Colonoscopy Indications:           Rectal bleeding, Acute post hemorrhagic anemia Providers:             Lin Landsman MD, MD Referring MD:          No Local Md, MD (Referring MD) Medicines:             Monitored Anesthesia Care Complications:         No immediate complications. Estimated blood loss:                         Minimal. Procedure:             Pre-Anesthesia Assessment:                        - Prior to the procedure, a History and Physical was                         performed, and patient medications and allergies were                         reviewed. The patient is competent. The risks and                         benefits of the procedure and the sedation options and                         risks were discussed with the patient. All questions                         were answered and informed consent was obtained.                         Patient identification and proposed procedure were                         verified by the physician, the nurse, the                         anesthesiologist, the anesthetist and the technician                         in the pre-procedure area in the procedure room in the                         endoscopy suite. Mental Status Examination: alert and                         oriented. Airway Examination: normal oropharyngeal                         airway and neck mobility. Respiratory Examination:  clear to auscultation. CV Examination: normal.                         Prophylactic Antibiotics: The patient does not require                         prophylactic antibiotics. Prior Anticoagulants: The                          patient has taken Eliquis (apixaban), last dose was 2                         days prior to procedure. ASA Grade Assessment: III - A                         patient with severe systemic disease. After reviewing                         the risks and benefits, the patient was deemed in                         satisfactory condition to undergo the procedure. The                         anesthesia plan was to use monitored anesthesia care                         (MAC). Immediately prior to administration of                         medications, the patient was re-assessed for adequacy                         to receive sedatives. The heart rate, respiratory                         rate, oxygen saturations, blood pressure, adequacy of                         pulmonary ventilation, and response to care were                         monitored throughout the procedure. The physical                         status of the patient was re-assessed after the                         procedure.                        After obtaining informed consent, the colonoscope was                         passed under direct vision. Throughout the procedure,                         the patient's blood pressure, pulse, and oxygen  saturations were monitored continuously. The                         Colonoscope was introduced through the anus and                         advanced to the the cecum, identified by appendiceal                         orifice and ileocecal valve. The colonoscopy was                         performed without difficulty. The patient tolerated                         the procedure well. The quality of the bowel                         preparation was evaluated using the BBPS [facility] Bowel                         Preparation Scale) with scores of: Right Colon = 3,                         Transverse Colon = 3 and Left Colon = 3 (entire mucosa                         seen well  with no residual staining, small fragments                         of stool or opaque liquid). The total BBPS score                         equals 9. Findings:      The digital rectal exam findings include bright red blood. Pertinent       negatives include normal sphincter tone and no palpable rectal lesions.      A continuous area of bleeding ulcerated mucosa with stigmata of recent       bleeding was present in the distal rectum. Area was successfully       injected with 5 mL of a 1:10,000 solution of epinephrine for hemostasis.       For hemostasis, hemostatic spray was deployed. A single spray was       applied. There was no bleeding at the end of the procedure. Estimated       blood loss: 50 mL requiring treatment with epinephrine. Biopsies were       not performed due to actively bleeding ulcer.      The colon (entire examined portion) appeared normal. Impression:            - Bright red blood found on digital rectal exam.                        - Mucosal ulceration bleeding. Injected. hemostatic                         spray applied.                        -  Unclear etilogy of ulcer - stercoral ulcer or                         ischemic proctitis                        - The entire examined colon is normal.                        - No specimens collected. Recommendation:        - Return patient to hospital ward for ongoing care.                        - Resume previous diet today.                        - Continue present medications.                        - Recommend to hold eliquis until ulcer is healed Procedure Code(s):     --- Professional ---                        [phone], Colonoscopy, flexible; with control of                         bleeding, any method Diagnosis Code(s):     --- Professional ---                        K63.3, Ulcer of intestine                        K62.5, Hemorrhage of anus and rectum                        D62, Acute posthemorrhagic anemia CPT  copyright 2019 American Medical Association. All rights reserved. The codes documented in this report are preliminary and upon coder review may  be revised to meet current compliance requirements. Dr. Ulyess Mort Lin Landsman MD, MD 07/02/2019 1:45:07 PM This report has been signed electronically. Number of Addenda: 0 Note Initiated On: 07/02/2019 12:07 PM Scope Withdrawal Time: 0 hours 26 minutes 33 seconds  Total Procedure Duration: 0 hours 34 minutes 24 seconds       [facility]

## 2019-07-03 DIAGNOSIS — K626 Ulcer of anus and rectum: Principal | ICD-10-CM

## 2019-07-03 LAB — CBC
HCT: 34.5 % — ABNORMAL LOW (ref 39.0–52.0)
Hemoglobin: 11.1 g/dL — ABNORMAL LOW (ref 13.0–17.0)
MCH: 29.6 pg (ref 26.0–34.0)
MCHC: 32.2 g/dL (ref 30.0–36.0)
MCV: 92 fL (ref 80.0–100.0)
Platelets: 179 10*3/uL (ref 150–400)
RBC: 3.75 MIL/uL — ABNORMAL LOW (ref 4.22–5.81)
RDW: 13.2 % (ref 11.5–15.5)
WBC: 6.8 10*3/uL (ref 4.0–10.5)
nRBC: 0 % (ref 0.0–0.2)

## 2019-07-03 LAB — HEMOGLOBIN AND HEMATOCRIT, BLOOD
HCT: 34.2 % — ABNORMAL LOW (ref 39.0–52.0)
Hemoglobin: 11 g/dL — ABNORMAL LOW (ref 13.0–17.0)

## 2019-07-03 LAB — BASIC METABOLIC PANEL
Anion gap: 10 (ref 5–15)
BUN: 38 mg/dL — ABNORMAL HIGH (ref 8–23)
CO2: 27 mmol/L (ref 22–32)
Calcium: 8.6 mg/dL — ABNORMAL LOW (ref 8.9–10.3)
Chloride: 102 mmol/L (ref 98–111)
Creatinine, Ser: 2.33 mg/dL — ABNORMAL HIGH (ref 0.61–1.24)
GFR calc Af Amer: 29 mL/min — ABNORMAL LOW (ref >60)
GFR calc non Af Amer: 25 mL/min — ABNORMAL LOW (ref >60)
Glucose, Bld: 115 mg/dL — ABNORMAL HIGH (ref 70–99)
Potassium: 4.1 mmol/L (ref 3.5–5.1)
Sodium: 139 mmol/L (ref 135–145)

## 2019-07-03 LAB — GLUCOSE, CAPILLARY
Glucose-Capillary: 107 mg/dL — ABNORMAL HIGH (ref 70–99)
Glucose-Capillary: 105 mg/dL — ABNORMAL HIGH (ref 70–99)
Glucose-Capillary: 93 mg/dL (ref 70–99)
Glucose-Capillary: 130 mg/dL — ABNORMAL HIGH (ref 70–99)
Glucose-Capillary: 125 mg/dL — ABNORMAL HIGH (ref 70–99)

## 2019-07-03 LAB — MAGNESIUM: Magnesium: 2.2 mg/dL (ref 1.7–2.4)

## 2019-07-03 MED ORDER — MORPHINE SULFATE (PF) 2 MG/ML IV SOLN
2.0000 mg | INTRAVENOUS | Status: DC | PRN
  Administered 2019-07-03: 2 mg via INTRAVENOUS
  Filled 2019-07-03: qty 1

## 2019-07-03 MED ORDER — TAMSULOSIN HCL 0.4 MG PO CAPS
0.4000 mg | ORAL_CAPSULE | Freq: Every day | ORAL | Status: DC
  Administered 2019-07-03: 0.4 mg via ORAL
  Filled 2019-07-03: qty 1

## 2019-07-03 MED ORDER — LIDOCAINE HCL URETHRAL/MUCOSAL 2 % EX GEL
1.0000 "application " | CUTANEOUS | Status: DC | PRN
  Filled 2019-07-03: qty 5

## 2019-07-03 MED ORDER — INSULIN ASPART 100 UNIT/ML ~~LOC~~ SOLN
0.0000 [IU] | Freq: Three times a day (TID) | SUBCUTANEOUS | Status: DC
  Administered 2019-07-03 – 2019-07-04 (×2): 1 [IU] via SUBCUTANEOUS
  Filled 2019-07-03 (×2): qty 1

## 2019-07-03 NOTE — Progress Notes (Signed)
Notified Dr. Billie Ruddy that pt's HR 100 - 120's not sustaining A.fib at rest.  No new orders received.

## 2019-07-03 NOTE — Progress Notes (Signed)
Occupational Therapy Treatment Patient Details Name: [name] MRN: [id number] DOB: [birth year] Today's Date: 07/03/2019    History of present illness Pt is an 86 y.o. male with medical history significant of DM2, CKD, COPD, atrial fibrillation, CHF, hypertension, kidney stones, and constipation who presented with large bloody stool at home.  MD assessment includes: Lower GI bleed, chronic systolic heart failure, HTN, OSA, and A-fib.   OT comments  Patient seen for ADLs this date, able to demo transfers with modified independence from bed and toilet.  Able to complete grooming standing at the sink.  Supervision to and from the bathroom.  LB dressing modified independence from a seated position for socks, clothing negotiation in standing with modified independence.  Patient with lines and monitors so needs some assist with working around these.  Strength in BUE 4/5 overall and is progressing well.  Patient reports he feels close to his baseline level of care, slightly weaker from being in bed the last few days.  He did not require any adaptive equipment this date for mobility or for ADL skills.  If patient continues to progress, he may not require HHOT upon discharge.  Wife present and she reports she can assist patient if needed.     Follow Up Recommendations  Supervision - Intermittent;Home health OT    Equipment Recommendations  3 in 1 bedside commode;Tub/shower bench    Recommendations for Other Services      Precautions / Restrictions Precautions Precautions: Fall Restrictions Weight Bearing Restrictions: No       Mobility Bed Mobility Overal bed mobility: Modified Independent                Transfers Overall transfer level: Modified independent               General transfer comment: Toilet transfer with supervision to and from the bathroom    Balance Overall balance assessment: Needs assistance   Sitting balance-Leahy Scale: Good     Standing balance  support: Bilateral upper extremity supported Standing balance-Leahy Scale: Fair                             ADL either performed or assessed with clinical judgement   ADL   Eating/Feeding: Modified independent   Grooming: Modified independent;Standing               Lower Body Dressing: Modified independent Lower Body Dressing Details (indicate cue type and reason): Able to demonstrate socks, clothing negotiation after toileting Toilet Transfer: Supervision/safety   Toileting- Clothing Manipulation and Hygiene: Modified independent               Vision       Perception     Praxis      Cognition Arousal/Alertness: Awake/alert Behavior During Therapy: WFL for tasks assessed/performed Overall Cognitive Status: Within Functional Limits for tasks assessed                                          Exercises Other Exercises Other Exercises: Patient with ROM WNLs for UE, strength 4/5 overall.  Feels a bit weak from being in the hospital but patient reports he feels almost back to baseline   Shoulder Instructions       General Comments      Pertinent Vitals/ Pain       Pain  Assessment: No/denies pain Pain Score: 0-No pain  Home Living                                          Prior Functioning/Environment              Frequency  Min 1X/week        Progress Toward Goals  OT Goals(current goals can now be found in the care plan section)  Progress towards OT goals: Progressing toward goals  Acute Rehab OT Goals Patient Stated Goal: to feel better OT Goal Formulation: With patient/family Time For Goal Achievement: 07/15/19 Potential to Achieve Goals: Good  Plan Discharge plan remains appropriate    Co-evaluation                 AM-PAC OT "6 Clicks" Daily Activity     Outcome Measure   Help from another person eating meals?: None Help from another person taking care of personal grooming?:  None Help from another person toileting, which includes using toliet, bedpan, or urinal?: A Little Help from another person bathing (including washing, rinsing, drying)?: A Little Help from another person to put on and taking off regular upper body clothing?: None Help from another person to put on and taking off regular lower body clothing?: None 6 Click Score: 22    End of Session Equipment Utilized During Treatment: Gait belt  OT Visit Diagnosis: Other abnormalities of gait and mobility (R26.89);Muscle weakness (generalized) (M62.81)   Activity Tolerance Patient tolerated treatment well   Patient Left in bed;with call bell/phone within reach;with family/visitor present   Nurse Communication          Time: 0347-4259 OT Time Calculation (min): 15 min  Charges: OT General Charges $OT Visit: 1 Visit OT Treatments $Self Care/Home Management : 8-22 mins [name] [name], OTR/L, CLT    [name] 07/03/2019, 12:28 PM

## 2019-07-03 NOTE — Progress Notes (Signed)
Pt reports difficulty voiding.  Reports that when he needs to void he has to press against the suprapubic area.  Bladder scan showed 0 ml.  Able to void. Denies dysuria.  Notified Dr Billie Ruddy of the above.  MD ordered flomax.

## 2019-07-03 NOTE — Progress Notes (Signed)
OT Cancellation Note  Patient Details Name: JAMMIE CLINK MRN: [id number] DOB: 1934/01/12   Cancelled Treatment:     Attempted to see patient however he is currently eating his lunch and requested therapy come back later.  Continue attempts for OT tx.  Deshay Kirstein T Janelis Stelzer, OTR/L, CLT   Abhi Moccia 07/03/2019, 11:53 AM

## 2019-07-03 NOTE — Progress Notes (Signed)
Vonda Antigua, MD [address] [place], [place], [place], [address] [address], [place], [place], [place], [address] Phone: [phone]  Fax: [phone]   Subjective: Patient states symptoms of bleeding have improved.  Reports very minimal blood per rectum this morning, compared to large amounts prior to colonoscopy with treatment   Objective: Exam: Vital signs in last 24 hours: Vitals:   07/03/19 0748 07/03/19 0831 07/03/19 1131 07/03/19 1540  BP: 91/71 109/76 112/75 113/84  Pulse: 100 (!) 104 (!) 105 (!) 107  Resp: 16  17 17   Temp: 98.2 F (36.8 C)  97.9 F (36.6 C) (!) 97.4 F (36.3 C)  TempSrc: Oral  Oral Oral  SpO2: 97%  97% 97%  Weight:      Height:       Weight change:   Intake/Output Summary (Last 24 hours) at 07/03/2019 1607 Last data filed at 07/03/2019 1018 Gross per 24 hour  Intake 490 ml  Output 625 ml  Net -135 ml    General: No acute distress, AAO x3 Abd: Soft, NT/ND, No HSM Skin: Warm, no rashes Neck: Supple, Trachea midline   Lab Results: Lab Results  Component Value Date   WBC 6.8 07/03/2019   HGB 11.1 (L) 07/03/2019   HCT 34.5 (L) 07/03/2019   MCV 92.0 07/03/2019   PLT 179 07/03/2019   Micro Results: Recent Results (from the past 240 hour(s))  SARS Coronavirus 2 by RT PCR (hospital order, performed in [place] hospital lab) Nasopharyngeal Nasopharyngeal Swab     Status: None   Collection Time: 06/30/19  7:34 PM   Specimen: Nasopharyngeal Swab  Result Value Ref Range Status   SARS Coronavirus 2 NEGATIVE NEGATIVE Final    Comment: (NOTE) SARS-CoV-2 target nucleic acids are NOT DETECTED. The SARS-CoV-2 RNA is generally detectable in upper and lower respiratory specimens during the acute phase of infection. The lowest concentration of SARS-CoV-2 viral copies this assay can detect is 250 copies / mL. A negative result does not preclude SARS-CoV-2 infection and should not be used as the sole basis for treatment or  other patient management decisions.  A negative result may occur with improper specimen collection / handling, submission of specimen other than nasopharyngeal swab, presence of viral mutation(s) within the areas targeted by this assay, and inadequate number of viral copies (<250 copies / mL). A negative result must be combined with clinical observations, patient history, and epidemiological information. Fact Sheet for Patients:   StrictlyIdeas.no Fact Sheet for Healthcare Providers: BankingDealers.co.za This test is not yet approved or cleared  by the [place] FDA and has been authorized for detection and/or diagnosis of SARS-CoV-2 by FDA under an Emergency Use Authorization (EUA).  This EUA will remain in effect (meaning this test can be used) for the duration of the COVID-19 declaration under Section 564(b)(1) of the Act, 21 U.S.C. section 360bbb-3(b)(1), unless the authorization is terminated or revoked sooner. Performed at [facility], [address], [place], [place] [address]    Studies/Results: No results found. Medications:  Scheduled Meds: . atorvastatin  40 mg Oral q1800  . docusate sodium  100 mg Oral BID  . insulin aspart  0-9 Units Subcutaneous Q4H  . pantoprazole  40 mg Oral BID  . polyethylene glycol  17 g Oral BID WC  . sodium chloride flush  3 mL Intravenous Q12H  . tamsulosin  0.4 mg Oral QPC supper   Continuous Infusions: . sodium chloride     PRN Meds:.sodium chloride,  acetaminophen **OR** acetaminophen, albuterol, albuterol, HYDROcodone-acetaminophen, lidocaine, LORazepam, ondansetron **OR** ondansetron (ZOFRAN) IV, sodium chloride flush   Assessment: Active Problems:   Essential hypertension   Chronic kidney disease   Type 2 diabetes mellitus with stage 3 chronic kidney disease, without long-term current use of insulin (HCC)   Hyperlipidemia, mixed   OSA (obstructive sleep apnea)    Paroxysmal A-fib ([place])   Uncontrolled type 2 diabetes mellitus with hyperglycemia (HCC)   Chronic systolic heart failure (HCC)   Lower GI bleed    Plan: Hemoglobin stable and symptoms of bright red blood per rectum improved after treatment of stercoral ulcer   Continue to avoid constipation  Bowel regimen as needed  Continue to monitor symptoms and please page GI with any signs of active GI bleeding.  Follow-up in GI clinic in 1 to 2 weeks   LOS: 3 days   Vonda Antigua, MD 07/03/2019, 4:07 PM

## 2019-07-03 NOTE — Progress Notes (Signed)
PROGRESS NOTE    [name]  WUJ:[id number] DOB: Jun 16, 1933 DOA: 06/30/2019 PCP: Ronnell Freshwater, NP    Assessment & Plan:   Active Problems:   Essential hypertension   Chronic kidney disease   Type 2 diabetes mellitus with stage 3 chronic kidney disease, without long-term current use of insulin (HCC)   Hyperlipidemia, mixed   OSA (obstructive sleep apnea)   Paroxysmal A-fib ([place])   Uncontrolled type 2 diabetes mellitus with hyperglycemia (HCC)   Chronic systolic heart failure (HCC)   Lower GI bleed    [name] is a 86 y.o. AA male with medical history significant of DM2, CKD, atrial fibrillation on Eliquis, CHF, hypertension, kidney stones, constipation, diverticulitis who presented with bloody BM's.   Patient have had numerous ER visits last week on 30 May he had chest pain or shortness of breath and presented to emergency department he was worked up for PE and coronary artery syndrome at the time and was negative he was nonetheless diagnosed with constipation and rectal exam was done at that time and attempt to disimpact.  He was prescribed bowel regimen thereafter.  Patient had recurrent episodes of rectal pain as well as sometimes sensation of bladder being full.  Wife states that today prior to starting to bleeding he had a BM which was well formed. But then he had a dark bloody stool x1 at home and as well as 1 more in emergency department   # Lower GI bleed 2/2 bleeding rectal ulcer # Rectal ulcer likely stercoral ulcer in setting of chronic constipation # anemia 2/2 blood loss --Multiple episodes of passing blood clots per rectum.  Hgb has been stable and normal ~14.  No fever and abdominal pain. --colonoscopy today, found "Actively bleeding rectal ulcer as well as visible vessel in the distal rectum The ulcer was occupying almost entire circumference of the distal rectum 5 cc epi injected to the ulcer and hemostatic spray delivered." PLAN: --Miralax BID to  keep stool soft serve consistency --continue to hold Eliquis until ulcer is healed which may take about 4 weeks  --lidocaine gel inside the anus PRN for pain --Follow-up with GI as outpatient in 1-2 weeks  . Chronic kidney disease -stage IIIb creatinine slightly above baseline  hold diuretics    . Essential hypertension  hold Coreg given low normal BP  . Hyperlipidemia, mixed chronic stable  continue home medication  . OSA (obstructive sleep apnea)  -continue CPAP  . Paroxysmal A-fib (HCC)  -hold Eliquis given GI bleed Hold Coreg due to low normal BP  DM2 --hold home oral agent --BG ACHS and SSI   DVT prophylaxis: SCD/Compression stockings Code Status: Full code  Family Communication: wife updated on the phone today Status is: inpatient Dispo:   The patient is from: home Anticipated d/c is to: home Anticipated d/c date is: tomorrow Patient currently is not medically stable to d/c due to:  Will monitor overnight to ensure no further rectal bleeding and Hgb stable.   Subjective and Interval History:  Pt reported BM x1, not as much blood in it, and not so much pain.  No fever, dyspnea, chest pain, abdominal pain, N/V/D.  Pt later reported difficulty urinating, needing to strain, but no dysuria, and bladder scan 0.    Objective: Vitals:   07/03/19 0402 07/03/19 0748 07/03/19 0831 07/03/19 1131  BP: 101/75 91/71 109/76 112/75  Pulse: 88 100 (!) 104 (!) 105  Resp: 16 16  17   Temp: 98.2 F (  36.8 C) 98.2 F (36.8 C)  97.9 F (36.6 C)  TempSrc: Oral Oral  Oral  SpO2: 94% 97%  97%  Weight:      Height:        Intake/Output Summary (Last 24 hours) at 07/03/2019 1331 Last data filed at 07/03/2019 1018 Gross per 24 hour  Intake 890 ml  Output 625 ml  Net 265 ml   Filed Weights   06/30/19 1617 07/02/19 1233  Weight: 108.9 kg 108.9 kg    Examination:   Constitutional: NAD, AAOx3 HEENT: conjunctivae and lids normal, EOMI CV: RRR no M,R,G. Distal pulses  +2.  No cyanosis.   RESP: CTA B/L, normal respiratory effort  GI: +BS, NTND Extremities: No effusions, edema, or tenderness in BLE SKIN: warm, dry and intact Neuro: II - XII grossly intact.  Sensation intact Psych: better mood and affect.     Data Reviewed: I have personally reviewed following labs and imaging studies  CBC: Recent Labs  Lab 06/30/19 1620 06/30/19 2332 07/01/19 0456 07/01/19 0456 07/01/19 1107 07/01/19 1558 07/02/19 0446 07/02/19 1510 07/03/19 0453  WBC 7.1   < > 6.5  --  8.8 8.0  --  7.8 6.8  NEUTROABS 4.3  --  3.1  --   --   --   --   --   --   HGB 15.0   < > 14.2   < > 14.4 13.4 12.5* 12.6* 11.1*  HCT 46.7   < > 43.8   < > 45.2 41.5 39.3 38.0* 34.5*  MCV 92.7   < > 92.0  --  93.6 92.4  --  91.1 92.0  PLT 197   < > 186  --  206 196  --  194 179   < > = values in this interval not displayed.   Basic Metabolic Panel: Recent Labs  Lab 06/30/19 1620 07/01/19 0456 07/02/19 0446 07/03/19 0453  NA 140 139 138 139  K 4.5 4.0 4.8 4.1  CL 101 103 102 102  CO2 28 26 28 27   GLUCOSE 138* 128* 152* 115*  BUN 52* 49* 48* 38*  CREATININE 2.75* 2.45* 2.73* 2.33*  CALCIUM 9.0 9.0 8.7* 8.6*  MG  --  2.2 2.6* 2.2  PHOS  --  3.6  --   --    GFR: Estimated Creatinine Clearance: 31.5 mL/min (A) (by C-G formula based on SCr of 2.33 mg/dL (H)). Liver Function Tests: Recent Labs  Lab 06/30/19 1620 07/01/19 0456  AST 24 21  ALT 35 31  ALKPHOS 112 105  BILITOT 0.9 0.5  PROT 7.9 7.4  ALBUMIN 3.5 3.3*   Recent Labs  Lab 06/30/19 1620  LIPASE 51   No results for input(s): AMMONIA in the last 168 hours. Coagulation Profile: No results for input(s): INR, PROTIME in the last 168 hours. Cardiac Enzymes: No results for input(s): CKTOTAL, CKMB, CKMBINDEX, TROPONINI in the last 168 hours. BNP (last 3 results) No results for input(s): PROBNP in the last 8760 hours. HbA1C: Recent Labs    06/30/19 2332  HGBA1C 7.8*   CBG: Recent Labs  Lab 07/02/19 1958  07/02/19 2347 07/03/19 0358 07/03/19 0748 07/03/19 1133  GLUCAP 163* 78 107* 105* 93   Lipid Profile: No results for input(s): CHOL, HDL, LDLCALC, TRIG, CHOLHDL, LDLDIRECT in the last 72 hours. Thyroid Function Tests: Recent Labs    07/01/19 0456  TSH 2.357   Anemia Panel: No results for input(s): VITAMINB12, FOLATE, FERRITIN, TIBC, IRON, RETICCTPCT in the  last 72 hours. Sepsis Labs: No results for input(s): PROCALCITON, LATICACIDVEN in the last 168 hours.  Recent Results (from the past 240 hour(s))  SARS Coronavirus 2 by RT PCR (hospital order, performed in [facility] hospital lab) Nasopharyngeal Nasopharyngeal Swab     Status: None   Collection Time: 06/30/19  7:34 PM   Specimen: Nasopharyngeal Swab  Result Value Ref Range Status   SARS Coronavirus 2 NEGATIVE NEGATIVE Final    Comment: (NOTE) SARS-CoV-2 target nucleic acids are NOT DETECTED. The SARS-CoV-2 RNA is generally detectable in upper and lower respiratory specimens during the acute phase of infection. The lowest concentration of SARS-CoV-2 viral copies this assay can detect is 250 copies / mL. A negative result does not preclude SARS-CoV-2 infection and should not be used as the sole basis for treatment or other patient management decisions.  A negative result may occur with improper specimen collection / handling, submission of specimen other than nasopharyngeal swab, presence of viral mutation(s) within the areas targeted by this assay, and inadequate number of viral copies (<250 copies / mL). A negative result must be combined with clinical observations, patient history, and epidemiological information. Fact Sheet for Patients:   StrictlyIdeas.no Fact Sheet for Healthcare Providers: BankingDealers.co.za This test is not yet approved or cleared  by the [place] FDA and has been authorized for detection and/or diagnosis of SARS-CoV-2 by FDA under an Emergency  Use Authorization (EUA).  This EUA will remain in effect (meaning this test can be used) for the duration of the COVID-19 declaration under Section 564(b)(1) of the Act, 21 U.S.C. section 360bbb-3(b)(1), unless the authorization is terminated or revoked sooner. Performed at [facility], [address], [place], [place] [address]       Radiology Studies: No results found.   Scheduled Meds: . atorvastatin  40 mg Oral q1800  . docusate sodium  100 mg Oral BID  . insulin aspart  0-9 Units Subcutaneous Q4H  . pantoprazole  40 mg Oral BID  . polyethylene glycol  17 g Oral BID WC  . sodium chloride flush  3 mL Intravenous Q12H   Continuous Infusions: . sodium chloride       LOS: 3 days     Enzo Bi, MD [facility] If 7PM-7AM, please contact night-coverage 07/03/2019, 1:31 PM

## 2019-07-03 NOTE — Progress Notes (Signed)
NT notified writer patient having bleeding from bottom.  Assessment reveals patient  having bright red bleeding from anus filling up the commode x 2 within 15 minutes and having to change brief x2. When I wiped anal area bright red blood with small amt coagulated blood on tissue. MD on call notified. Awaiting orders.

## 2019-07-03 NOTE — Progress Notes (Signed)
Pt refused to wear cpap for the night, stated he can not tolerate.

## 2019-07-03 NOTE — Progress Notes (Signed)
Chaplain On-Call responded to Rapid Response pager notification. Medical team is attending to patient.  RN stated that the patient is being readied for transfer to higher level hospital due to severe GI bleed.  Staff will contact Chaplain if any family members of the patient arrive.  [place] Enid Derry, M.Div., [facility]

## 2019-07-04 DIAGNOSIS — N183 Chronic kidney disease, stage 3 unspecified: Secondary | ICD-10-CM | POA: Diagnosis not present

## 2019-07-04 DIAGNOSIS — K7689 Other specified diseases of liver: Secondary | ICD-10-CM | POA: Diagnosis not present

## 2019-07-04 DIAGNOSIS — I5042 Chronic combined systolic (congestive) and diastolic (congestive) heart failure: Secondary | ICD-10-CM | POA: Diagnosis not present

## 2019-07-04 DIAGNOSIS — I4891 Unspecified atrial fibrillation: Secondary | ICD-10-CM | POA: Diagnosis not present

## 2019-07-04 DIAGNOSIS — I130 Hypertensive heart and chronic kidney disease with heart failure and stage 1 through stage 4 chronic kidney disease, or unspecified chronic kidney disease: Secondary | ICD-10-CM | POA: Diagnosis not present

## 2019-07-04 DIAGNOSIS — K627 Radiation proctitis: Secondary | ICD-10-CM | POA: Diagnosis not present

## 2019-07-04 DIAGNOSIS — I2510 Atherosclerotic heart disease of native coronary artery without angina pectoris: Secondary | ICD-10-CM | POA: Diagnosis not present

## 2019-07-04 DIAGNOSIS — J449 Chronic obstructive pulmonary disease, unspecified: Secondary | ICD-10-CM | POA: Diagnosis not present

## 2019-07-04 DIAGNOSIS — R0902 Hypoxemia: Secondary | ICD-10-CM | POA: Diagnosis not present

## 2019-07-04 DIAGNOSIS — R591 Generalized enlarged lymph nodes: Secondary | ICD-10-CM | POA: Diagnosis not present

## 2019-07-04 DIAGNOSIS — M1612 Unilateral primary osteoarthritis, left hip: Secondary | ICD-10-CM | POA: Diagnosis not present

## 2019-07-04 DIAGNOSIS — C775 Secondary and unspecified malignant neoplasm of intrapelvic lymph nodes: Secondary | ICD-10-CM | POA: Diagnosis not present

## 2019-07-04 DIAGNOSIS — E1122 Type 2 diabetes mellitus with diabetic chronic kidney disease: Secondary | ICD-10-CM | POA: Diagnosis not present

## 2019-07-04 DIAGNOSIS — K769 Liver disease, unspecified: Secondary | ICD-10-CM | POA: Diagnosis not present

## 2019-07-04 DIAGNOSIS — I444 Left anterior fascicular block: Secondary | ICD-10-CM | POA: Diagnosis not present

## 2019-07-04 DIAGNOSIS — Z86010 Personal history of colonic polyps: Secondary | ICD-10-CM | POA: Diagnosis not present

## 2019-07-04 DIAGNOSIS — R52 Pain, unspecified: Secondary | ICD-10-CM | POA: Diagnosis not present

## 2019-07-04 DIAGNOSIS — K5730 Diverticulosis of large intestine without perforation or abscess without bleeding: Secondary | ICD-10-CM | POA: Diagnosis not present

## 2019-07-04 DIAGNOSIS — K625 Hemorrhage of anus and rectum: Secondary | ICD-10-CM | POA: Diagnosis not present

## 2019-07-04 DIAGNOSIS — Z923 Personal history of irradiation: Secondary | ICD-10-CM | POA: Diagnosis not present

## 2019-07-04 DIAGNOSIS — K921 Melena: Secondary | ICD-10-CM | POA: Diagnosis not present

## 2019-07-04 DIAGNOSIS — N200 Calculus of kidney: Secondary | ICD-10-CM | POA: Diagnosis not present

## 2019-07-04 DIAGNOSIS — G4733 Obstructive sleep apnea (adult) (pediatric): Secondary | ICD-10-CM | POA: Diagnosis not present

## 2019-07-04 DIAGNOSIS — N179 Acute kidney failure, unspecified: Secondary | ICD-10-CM | POA: Diagnosis not present

## 2019-07-04 DIAGNOSIS — I4821 Permanent atrial fibrillation: Secondary | ICD-10-CM | POA: Diagnosis not present

## 2019-07-04 DIAGNOSIS — K922 Gastrointestinal hemorrhage, unspecified: Secondary | ICD-10-CM | POA: Diagnosis not present

## 2019-07-04 DIAGNOSIS — K626 Ulcer of anus and rectum: Secondary | ICD-10-CM | POA: Diagnosis not present

## 2019-07-04 DIAGNOSIS — R000 Tachycardia, unspecified: Secondary | ICD-10-CM | POA: Diagnosis not present

## 2019-07-04 DIAGNOSIS — I4510 Unspecified right bundle-branch block: Secondary | ICD-10-CM | POA: Diagnosis not present

## 2019-07-04 DIAGNOSIS — J984 Other disorders of lung: Secondary | ICD-10-CM | POA: Diagnosis not present

## 2019-07-04 DIAGNOSIS — Z743 Need for continuous supervision: Secondary | ICD-10-CM | POA: Diagnosis not present

## 2019-07-04 LAB — LACTIC ACID, PLASMA
Lactic Acid, Venous: 1.4 mmol/L (ref 0.5–1.9)
Lactic Acid, Venous: 1.4 mmol/L (ref 0.5–1.9)

## 2019-07-04 LAB — GLUCOSE, CAPILLARY: Glucose-Capillary: 138 mg/dL — ABNORMAL HIGH (ref 70–99)

## 2019-07-04 LAB — BASIC METABOLIC PANEL
Anion gap: 6 (ref 5–15)
BUN: 34 mg/dL — ABNORMAL HIGH (ref 8–23)
CO2: 27 mmol/L (ref 22–32)
Calcium: 8.4 mg/dL — ABNORMAL LOW (ref 8.9–10.3)
Chloride: 103 mmol/L (ref 98–111)
Creatinine, Ser: 2.44 mg/dL — ABNORMAL HIGH (ref 0.61–1.24)
GFR calc Af Amer: 27 mL/min — ABNORMAL LOW (ref >60)
GFR calc non Af Amer: 23 mL/min — ABNORMAL LOW (ref >60)
Glucose, Bld: 162 mg/dL — ABNORMAL HIGH (ref 70–99)
Potassium: 4.7 mmol/L (ref 3.5–5.1)
Sodium: 136 mmol/L (ref 135–145)

## 2019-07-04 LAB — HEMOGLOBIN AND HEMATOCRIT, BLOOD
HCT: 32.2 % — ABNORMAL LOW (ref 39.0–52.0)
Hemoglobin: 10.2 g/dL — ABNORMAL LOW (ref 13.0–17.0)

## 2019-07-04 LAB — MAGNESIUM: Magnesium: 2.3 mg/dL (ref 1.7–2.4)

## 2019-07-04 LAB — PROTIME-INR
INR: 1.1 (ref 0.8–1.2)
Prothrombin Time: 14 seconds (ref 11.4–15.2)

## 2019-07-04 MED ORDER — CARVEDILOL 25 MG PO TABS: 25.0000 mg | ORAL_TABLET | Freq: Two times a day (BID) | ORAL | Status: DC

## 2019-07-04 MED ORDER — SODIUM CHLORIDE 0.9 % IV BOLUS
1000.0000 mL | Freq: Once | INTRAVENOUS | Status: AC
  Administered 2019-07-04: 1000 mL via INTRAVENOUS

## 2019-07-04 NOTE — Discharge Summary (Signed)
.  Physician Discharge Summary  EGBERT SEIDEL IOE:[id number] DOB: 08-28-1933 DOA: 06/30/2019  PCP: Ronnell Freshwater, NP  Admit date: 06/30/2019 Discharge date: 07/04/2019  Admitted From: Home Disposition: [facility]  Discharge Condition: Stable CODE STATUS: Full Diet recommendation: clear liquid Brief/Interim Summary: [name] Crumptonis a 86 y.o.AA malewith medical history significant of DM2, CKD,atrial fibrillation on Eliquis, CHF, hypertension, kidney stones, constipation, diverticulitis who presented with bloody bowel movements.  He underwent colonoscopy on 6/11 that showed bleeding rectal ulcer occupying almost entire circumference of the distal rectum.  It was treated with 5 cc epi injected to the ulcer and hemostatic spray.  Patient appeared to improve until the evening of 07/03/2019 when he had recurrence of bleeding, associated with palpitations, lightheadedness and shortness of breath.  He became hypotensive as low as 84/40 and tachycardic to the 1 teens.  He improved with IV fluid bolus.  Hemoglobin on admission was 15.7 but has been trending down gradually and in the last 24 hours had a drop from 11.7-10.2.  Discussed with GI specialist, Dr. Bonna Gains, as well as surgeon Dr. Celine Ahr who both recommended transfer for colorectal services.  Did previously speak with Dr. Micheline Chapman, vascular who did not think also bleeding when respond to embolization.  Discussed with colorectal surgeon Dr. Maude Leriche at [facility] who recommended GI.  Spoke with GI, Dr. Guadelupe Sabin who agreed that patient should be transferred to [facility].  Due to his acute decompensation, patient was accepted to the ICU by Dr. Posey Pronto.  At the time of transfer, patient is hemodynamically stable with blood pressure of 139/80 and heart rate of 100.  # Lower GI bleed 2/2 bleeding rectal ulcer # Rectal ulcer likely stercoral ulcer in setting of chronic constipation # anemia 2/2 blood loss -Chronic kidney disease-stage IIIb .Essential  hypertension .Hyperlipidemia,  .OSA on CPAP .Paroxysmal A-fib(HCC)  -hold Eliquis given GI blee DM2     Discharge Diagnoses:  Principal Problem:   Lower GI bleed Active Problems:   Essential hypertension   Chronic kidney disease   Type 2 diabetes mellitus with stage 3 chronic kidney disease, without long-term current use of insulin (HCC)   Hyperlipidemia, mixed   OSA (obstructive sleep apnea)   Paroxysmal A-fib ([place])   Uncontrolled type 2 diabetes mellitus with hyperglycemia (HCC)   Chronic systolic heart failure [facility])    Discharge Instructions  Discharge Instructions    Diet - low sodium heart healthy   Complete by: As directed    Increase activity slowly   Complete by: As directed      Allergies as of 07/04/2019   No Known Allergies     Medication List    TAKE these medications   albuterol 108 (90 Base) MCG/ACT inhaler Commonly known as: VENTOLIN HFA Inhale 2 puffs into the lungs every 4 (four) hours as needed for wheezing or shortness of breath.   atorvastatin 40 MG tablet Commonly known as: LIPITOR Take 1 tablet (40 mg total) by mouth daily at 6 PM.   carvedilol 25 MG tablet Commonly known as: COREG Take 25 mg by mouth 2 (two) times daily with a meal.   docusate sodium 100 MG capsule Commonly known as: Colace Take 1 capsule (100 mg total) by mouth 2 (two) times daily.   Eliquis 2.5 MG Tabs tablet Generic drug: apixaban Take 2.5 mg by mouth 2 (two) times daily.   OneTouch Verio test strip Generic drug: glucose blood USE ONCE DAILY. DX E11.65.   pantoprazole 40 MG tablet Commonly  known as: PROTONIX TAKE 1 TABLET(40 MG) BY MOUTH DAILY   sitaGLIPtin 25 MG tablet Commonly known as: Januvia Take 1 tablet (25 mg total) by mouth daily.   torsemide 20 MG tablet Commonly known as: DEMADEX Take 20 mg by mouth daily.       No Known Allergies  Consultations:  GI, Dr. Bonna Gains   Procedures/Studies: DG Chest 2 View  Result Date:  06/20/2019 CLINICAL DATA:  Chest pain EXAM: CHEST - 2 VIEW COMPARISON:  None. FINDINGS: There is mild cardiomegaly. Prominence of the central pulmonary vasculature seen. No large airspace consolidation or pleural effusion. Stable elevation of the left hemidiaphragm. No acute osseous abnormality. IMPRESSION: Mild cardiomegaly and pulmonary vascular congestion. Electronically Signed   By: Prudencio Pair M.D.   On: 06/20/2019 00:06   DG Abd 1 View  Result Date: 06/30/2019 CLINICAL DATA:  Constipation. EXAM: ABDOMEN - 1 VIEW COMPARISON:  None. FINDINGS: The bowel gas pattern is normal. A very mild amount of stool is seen within the large bowel. No radio-opaque calculi or other significant radiographic abnormality are seen. Multiple surgical clips are seen within the lower pelvis. IMPRESSION: Very mild stool burden without evidence of bowel obstruction. Electronically Signed   By: Virgina Norfolk M.D.   On: 06/30/2019 22:37   DG Abdomen 1 View  Result Date: 06/21/2019 CLINICAL DATA:  Constipation. EXAM: ABDOMEN - 1 VIEW COMPARISON:  CT abdomen pelvis 02/15/2018, abdominal radiograph 08/07/2017 FINDINGS: There are no dilated loops of bowel to suggest obstruction. No supine evidence for free air. Moderate amount of stool throughout the colon. Surgical clips are noted throughout the lower pelvis. No radiopaque renal calculus. No acute finding in the visualized skeleton. IMPRESSION: Nonobstructive bowel gas pattern. Moderate stool burden. Electronically Signed   By: Audie Pinto M.D.   On: 06/21/2019 13:49   CT Chest Wo Contrast  Result Date: 06/20/2019 CLINICAL DATA:  Sudden onset chest pain.  History of CHF. EXAM: CT CHEST WITHOUT CONTRAST TECHNIQUE: Multidetector CT imaging of the chest was performed following the standard protocol without IV contrast. COMPARISON:  Chest radiograph from one day prior. 04/13/2018 chest CT. FINDINGS: Cardiovascular: Mild cardiomegaly. No significant pericardial  effusion/thickening. Left anterior descending and right coronary atherosclerosis. Atherosclerotic nonaneurysmal thoracic aorta. Top-normal caliber main pulmonary artery (3.2 cm diameter). Mediastinum/Nodes: No discrete thyroid nodules. Unremarkable esophagus. No pathologically enlarged axillary, mediastinal or hilar lymph nodes, noting limited sensitivity for the detection of hilar adenopathy on this noncontrast study. Lungs/Pleura: No pneumothorax. No pleural effusion. Mild paraseptal emphysema with diffuse bronchial wall thickening. No acute consolidative airspace disease, lung masses or significant pulmonary nodules. Mild patchy subpleural reticulation in the lower lungs with associated scattered small parenchymal bands, not substantially changed. Upper abdomen: Simple 3.2 cm anterior left liver cyst. Partially visualized hypodense peripheral right liver 4.9 cm mass (series 2/image 143), stable since 04/13/2018 CT, presumably benign. Nonobstructing stones in the visualized upper kidneys bilaterally, largest 5 mm in the upper left kidney. Musculoskeletal: No aggressive appearing focal osseous lesions. Moderate thoracic spondylosis. IMPRESSION: 1. No acute pulmonary disease. Chronic scattered small parenchymal bands and nonspecific subpleural reticulation in the lower lungs, favoring nonspecific postinfectious/postinflammatory scarring. 2. Mild cardiomegaly. 3. Nonobstructing bilateral nephrolithiasis. 4. Aortic Atherosclerosis (ICD10-I70.0) and Emphysema (ICD10-J43.9). Electronically Signed   By: Ilona Sorrel M.D.   On: 06/20/2019 05:43   DG Abd 2 Views  Result Date: 06/30/2019 CLINICAL DATA:  Abdominal pain, rectal bleeding EXAM: ABDOMEN - 2 VIEW COMPARISON:  06/21/2019 FINDINGS: Supine and upright frontal views of  the abdomen and pelvis are obtained. Bowel gas pattern is unremarkable. No free gas in the greater peritoneal sac. No masses or abnormal calcifications. Postsurgical changes from prostatectomy. No  acute bony abnormalities. IMPRESSION: 1. Unremarkable exam. Electronically Signed   By: Randa Ngo M.D.   On: 06/30/2019 17:38    (Echo, Carotid, EGD, Colonoscopy, ERCP)    Subjective:   Discharge Exam: Vitals:   07/04/19 0336 07/04/19 0342  BP: 139/80   Pulse: (!) 143 100  Resp: 20   Temp: 98 F (36.7 C)   SpO2: 97%    Vitals:   07/04/19 0035 07/04/19 0123 07/04/19 0336 07/04/19 0342  BP: 101/64 110/75 139/80   Pulse:  99 (!) 143 100  Resp:   20   Temp:   98 F (36.7 C)   TempSrc:      SpO2:   97%   Weight:      Height:        General: Pt is alert, awake, not in acute distress Cardiovascular: RRR, S1/S2 +, no rubs, no gallops Respiratory: CTA bilaterally, no wheezing, no rhonchi Abdominal: Soft, NT, ND, bowel sounds + Extremities: no edema, no cyanosis    The results of significant diagnostics from this hospitalization (including imaging, microbiology, ancillary and laboratory) are listed below for reference.     Microbiology: Recent Results (from the past 240 hour(s))  SARS Coronavirus 2 by RT PCR (hospital order, performed in [facility] hospital lab) Nasopharyngeal Nasopharyngeal Swab     Status: None   Collection Time: 06/30/19  7:34 PM   Specimen: Nasopharyngeal Swab  Result Value Ref Range Status   SARS Coronavirus 2 NEGATIVE NEGATIVE Final    Comment: (NOTE) SARS-CoV-2 target nucleic acids are NOT DETECTED. The SARS-CoV-2 RNA is generally detectable in upper and lower respiratory specimens during the acute phase of infection. The lowest concentration of SARS-CoV-2 viral copies this assay can detect is 250 copies / mL. A negative result does not preclude SARS-CoV-2 infection and should not be used as the sole basis for treatment or other patient management decisions.  A negative result may occur with improper specimen collection / handling, submission of specimen other than nasopharyngeal swab, presence of viral mutation(s) within the areas  targeted by this assay, and inadequate number of viral copies (<250 copies / mL). A negative result must be combined with clinical observations, patient history, and epidemiological information. Fact Sheet for Patients:   StrictlyIdeas.no Fact Sheet for Healthcare Providers: BankingDealers.co.za This test is not yet approved or cleared  by the [place] FDA and has been authorized for detection and/or diagnosis of SARS-CoV-2 by FDA under an Emergency Use Authorization (EUA).  This EUA will remain in effect (meaning this test can be used) for the duration of the COVID-19 declaration under Section 564(b)(1) of the Act, 21 U.S.C. section 360bbb-3(b)(1), unless the authorization is terminated or revoked sooner. Performed at [facility], [address] [place], [place] [address]      Labs: BNP (last 3 results) Recent Labs    08/11/18 1311 12/11/18 0358 06/19/19 2339  BNP 348.0* 430.0* 097.3*   Basic Metabolic Panel: Recent Labs  Lab 06/30/19 1620 07/01/19 0456 07/02/19 0446 07/03/19 0453 07/04/19 0320  NA 140 139 138 139 136  K 4.5 4.0 4.8 4.1 4.7  CL 101 103 102 102 103  CO2 28 26 28 27 27   GLUCOSE 138* 128* 152* 115* 162*  BUN 52* 49* 48* 38* 34*  CREATININE 2.75* 2.45* 2.73* 2.33*  2.44*  CALCIUM 9.0 9.0 8.7* 8.6* 8.4*  MG  --  2.2 2.6* 2.2 2.3  PHOS  --  3.6  --   --   --    Liver Function Tests: Recent Labs  Lab 06/30/19 1620 07/01/19 0456  AST 24 21  ALT 35 31  ALKPHOS 112 105  BILITOT 0.9 0.5  PROT 7.9 7.4  ALBUMIN 3.5 3.3*   Recent Labs  Lab 06/30/19 1620  LIPASE 51   No results for input(s): AMMONIA in the last 168 hours. CBC: Recent Labs  Lab 06/30/19 1620 06/30/19 2332 07/01/19 0456 07/01/19 0456 07/01/19 1107 07/01/19 1107 07/01/19 1558 07/01/19 1558 07/02/19 0446 07/02/19 1510 07/03/19 0453 07/03/19 2234 07/04/19 0320  WBC 7.1   < > 6.5  --  8.8  --  8.0  --   --  7.8  6.8  --   --   NEUTROABS 4.3  --  3.1  --   --   --   --   --   --   --   --   --   --   HGB 15.0   < > 14.2   < > 14.4   < > 13.4   < > 12.5* 12.6* 11.1* 11.0* 10.2*  HCT 46.7   < > 43.8   < > 45.2   < > 41.5   < > 39.3 38.0* 34.5* 34.2* 32.2*  MCV 92.7   < > 92.0  --  93.6  --  92.4  --   --  91.1 92.0  --   --   PLT 197   < > 186  --  206  --  196  --   --  194 179  --   --    < > = values in this interval not displayed.   Cardiac Enzymes: No results for input(s): CKTOTAL, CKMB, CKMBINDEX, TROPONINI in the last 168 hours. BNP: Invalid input(s): POCBNP CBG: Recent Labs  Lab 07/03/19 0358 07/03/19 0748 07/03/19 1133 07/03/19 1720 07/03/19 1919  GLUCAP 107* 105* 93 130* 125*   D-Dimer No results for input(s): DDIMER in the last 72 hours. Hgb A1c No results for input(s): HGBA1C in the last 72 hours. Lipid Profile No results for input(s): CHOL, HDL, LDLCALC, TRIG, CHOLHDL, LDLDIRECT in the last 72 hours. Thyroid function studies Recent Labs    07/01/19 0456  TSH 2.357   Anemia work up No results for input(s): VITAMINB12, FOLATE, FERRITIN, TIBC, IRON, RETICCTPCT in the last 72 hours. Urinalysis    Component Value Date/Time   COLORURINE YELLOW (A) 07/01/2019 0506   APPEARANCEUR CLEAR (A) 07/01/2019 0506   APPEARANCEUR Clear 07/23/2018 0833   LABSPEC 1.016 07/01/2019 0506   LABSPEC 1.015 10/12/2013 1445   PHURINE 5.0 07/01/2019 0506   GLUCOSEU NEGATIVE 07/01/2019 0506   GLUCOSEU 50 mg/dL 10/12/2013 1445   HGBUR NEGATIVE 07/01/2019 0506   BILIRUBINUR NEGATIVE 07/01/2019 0506   BILIRUBINUR negative 06/29/2019 1556   BILIRUBINUR Negative 07/23/2018 0833   BILIRUBINUR Negative 10/12/2013 1445   KETONESUR NEGATIVE 07/01/2019 0506   PROTEINUR NEGATIVE 07/01/2019 0506   UROBILINOGEN 0.2 06/29/2019 1556   NITRITE NEGATIVE 07/01/2019 0506   LEUKOCYTESUR NEGATIVE 07/01/2019 0506   LEUKOCYTESUR Negative 10/12/2013 1445   Sepsis Labs Invalid input(s): PROCALCITONIN,  WBC,   LACTICIDVEN Microbiology Recent Results (from the past 240 hour(s))  SARS Coronavirus 2 by RT PCR (hospital order, performed in [facility] hospital lab) Nasopharyngeal Nasopharyngeal Swab  Status: None   Collection Time: 06/30/19  7:34 PM   Specimen: Nasopharyngeal Swab  Result Value Ref Range Status   SARS Coronavirus 2 NEGATIVE NEGATIVE Final    Comment: (NOTE) SARS-CoV-2 target nucleic acids are NOT DETECTED. The SARS-CoV-2 RNA is generally detectable in upper and lower respiratory specimens during the acute phase of infection. The lowest concentration of SARS-CoV-2 viral copies this assay can detect is 250 copies / mL. A negative result does not preclude SARS-CoV-2 infection and should not be used as the sole basis for treatment or other patient management decisions.  A negative result may occur with improper specimen collection / handling, submission of specimen other than nasopharyngeal swab, presence of viral mutation(s) within the areas targeted by this assay, and inadequate number of viral copies (<250 copies / mL). A negative result must be combined with clinical observations, patient history, and epidemiological information. Fact Sheet for Patients:   StrictlyIdeas.no Fact Sheet for Healthcare Providers: BankingDealers.co.za This test is not yet approved or cleared  by the [place] FDA and has been authorized for detection and/or diagnosis of SARS-CoV-2 by FDA under an Emergency Use Authorization (EUA).  This EUA will remain in effect (meaning this test can be used) for the duration of the COVID-19 declaration under Section 564(b)(1) of the Act, 21 U.S.C. section 360bbb-3(b)(1), unless the authorization is terminated or revoked sooner. Performed at [facility], [address] [place], [place] [address]      Time coordinating discharge: Over 30 minutes  SIGNED:   Athena Masse, MD  [facility] 07/04/2019, 4:39 AM Pager   If 7PM-7AM, please contact night-coverage [email or web address] Password [email or web address]

## 2019-07-04 NOTE — Plan of Care (Signed)
  Problem: Education: Goal: Knowledge of General Education information will improve Description Including pain rating scale, medication(s)/side effects and non-pharmacologic comfort measures Outcome: Progressing   

## 2019-07-04 NOTE — Progress Notes (Signed)
On arrival to room patient tachypneic and tachycardic, sitting on BSC. VS 113/91. HR 119 afib. See [facility] for further update, MD on floor plans for transfer to [facility].

## 2019-07-04 NOTE — Progress Notes (Addendum)
EMS on floor to transport patient to [facility]. Patient on 02 at 2l. No distress noted. Patient had bowel movement prior to transport. Packet was given to EMS staff. I called patients wife [name] to make aware patient picked up by EMS. Patient left with two iv's intact left forearm

## 2019-07-04 NOTE — Progress Notes (Signed)
Pt received to 2A PCU room 236 from 1A, report received from [place], [place].  See assessment and vs's.  Afib on the monitor with HR 90's-115.  Pt still grimacing in pain but BP low so bolus ordered.  Pt oriented to room and call bell.

## 2019-07-04 NOTE — Progress Notes (Signed)
Dr. Sharlene Motts in to assess patient resulting in patient being transported to 2a for closer monitoring while awaiting transfer to [facility]. Patient has continued rectal bleeding and pain above pubis mons, which he states is at a 10. Morphine 2mg  ordered and then administered. Patient states pain has lessened but remains present. Patient's wife notified of above changes and is in agreement along with Mr. Chachere of transportation to [facility] hospital.

## 2019-07-04 NOTE — Progress Notes (Signed)
Bed assigned at [facility] .  Report called to [facility] adult  MICU where pt will be going to room 4318.  Spoke with Eloisa Northern, RN.

## 2019-07-05 ENCOUNTER — Encounter: Payer: Self-pay | Admitting: Gastroenterology

## 2019-07-05 DIAGNOSIS — K921 Melena: Secondary | ICD-10-CM | POA: Diagnosis not present

## 2019-07-05 DIAGNOSIS — R591 Generalized enlarged lymph nodes: Secondary | ICD-10-CM | POA: Diagnosis not present

## 2019-07-05 DIAGNOSIS — N200 Calculus of kidney: Secondary | ICD-10-CM | POA: Diagnosis not present

## 2019-07-05 DIAGNOSIS — K626 Ulcer of anus and rectum: Secondary | ICD-10-CM | POA: Diagnosis not present

## 2019-07-05 DIAGNOSIS — K769 Liver disease, unspecified: Secondary | ICD-10-CM | POA: Diagnosis not present

## 2019-07-05 DIAGNOSIS — K5730 Diverticulosis of large intestine without perforation or abscess without bleeding: Secondary | ICD-10-CM | POA: Diagnosis not present

## 2019-07-06 DIAGNOSIS — K922 Gastrointestinal hemorrhage, unspecified: Secondary | ICD-10-CM | POA: Diagnosis not present

## 2019-07-06 LAB — SURGICAL PATHOLOGY

## 2019-07-07 ENCOUNTER — Ambulatory Visit: Payer: Medicare Other | Admitting: Nurse Practitioner

## 2019-07-07 DIAGNOSIS — K922 Gastrointestinal hemorrhage, unspecified: Secondary | ICD-10-CM | POA: Diagnosis not present

## 2019-07-08 DIAGNOSIS — K922 Gastrointestinal hemorrhage, unspecified: Secondary | ICD-10-CM | POA: Diagnosis not present

## 2019-07-08 DIAGNOSIS — K769 Liver disease, unspecified: Secondary | ICD-10-CM | POA: Diagnosis not present

## 2019-07-08 DIAGNOSIS — C775 Secondary and unspecified malignant neoplasm of intrapelvic lymph nodes: Secondary | ICD-10-CM | POA: Diagnosis not present

## 2019-07-08 NOTE — Anesthesia Postprocedure Evaluation (Signed)
Anesthesia Post Note  Patient: [name]  Procedure(s) Performed: COLONOSCOPY WITH PROPOFOL (N/A )  Patient location during evaluation: PACU Anesthesia Type: General Level of consciousness: awake and alert Pain management: pain level controlled Vital Signs Assessment: post-procedure vital signs reviewed and stable Respiratory status: spontaneous breathing, nonlabored ventilation, respiratory function stable and patient connected to nasal cannula oxygen Cardiovascular status: blood pressure returned to baseline and stable Postop Assessment: no apparent nausea or vomiting Anesthetic complications: no   No complications documented.   Last Vitals:  Vitals:   07/04/19 0528 07/04/19 0720  BP:  102/86  Pulse: 99 94  Resp:  20  Temp:  36.7 C  SpO2:  100%    Last Pain:  Vitals:   07/04/19 0827  TempSrc:   PainSc: 0-No pain                 Molli Barrows

## 2019-07-09 ENCOUNTER — Telehealth: Payer: Self-pay

## 2019-07-09 NOTE — Telephone Encounter (Signed)
Confirmed appointment on 07/13/2019 and screened for covid. [email or web address]

## 2019-07-11 DIAGNOSIS — K5641 Fecal impaction: Secondary | ICD-10-CM | POA: Insufficient documentation

## 2019-07-12 ENCOUNTER — Other Ambulatory Visit: Payer: Self-pay

## 2019-07-12 ENCOUNTER — Encounter: Payer: Self-pay | Admitting: Adult Health

## 2019-07-12 ENCOUNTER — Ambulatory Visit (INDEPENDENT_AMBULATORY_CARE_PROVIDER_SITE_OTHER): Payer: Medicare Other | Admitting: Adult Health

## 2019-07-12 VITALS — BP 103/69 | HR 76 | Temp 97.2°F | Resp 16 | Ht 72.0 in | Wt 235.0 lb

## 2019-07-12 DIAGNOSIS — I10 Essential (primary) hypertension: Secondary | ICD-10-CM | POA: Diagnosis not present

## 2019-07-12 DIAGNOSIS — E1121 Type 2 diabetes mellitus with diabetic nephropathy: Secondary | ICD-10-CM | POA: Diagnosis not present

## 2019-07-12 DIAGNOSIS — D649 Anemia, unspecified: Secondary | ICD-10-CM | POA: Diagnosis not present

## 2019-07-12 DIAGNOSIS — L03114 Cellulitis of left upper limb: Secondary | ICD-10-CM

## 2019-07-12 DIAGNOSIS — Z9079 Acquired absence of other genital organ(s): Secondary | ICD-10-CM

## 2019-07-12 DIAGNOSIS — N1832 Chronic kidney disease, stage 3b: Secondary | ICD-10-CM

## 2019-07-12 DIAGNOSIS — E1122 Type 2 diabetes mellitus with diabetic chronic kidney disease: Secondary | ICD-10-CM

## 2019-07-12 MED ORDER — CEPHALEXIN 500 MG PO CAPS: 500.0000 mg | ORAL_CAPSULE | Freq: Two times a day (BID) | ORAL | 0 refills | Status: DC

## 2019-07-12 NOTE — Progress Notes (Signed)
[facility] [place], [place] [address]  Internal MEDICINE  Office Visit Note  Patient Name: [name]  [id number]  [id number]   (((Transition of Care)))  Date of Service: 07/14/2019  Chief Complaint  Patient presents with  . Follow-up    hospital follow up  . Diabetes  . Hypertension  . Arm Pain    left arm swole    HPI  Pt is here for hospital follow up. He reports he went to the hospital on 07/04/19 for rectal bleeding.  He was transferred to [facility] and stayed until 07/08/2019 when he was discharged.  He was diagnosed with lower GI bleed. Hospital course copied from Dr. Ena Dawley discharge summary on 07/04/2019 "[name] is a 86 y.o. man with a past medical history of diverticulosis, atrial fibrillation on Eliquis, and prostate cancer treated in 2001 with radical prostatectomy and radiation who was admitted on 07/04/19 as a transfer from [facility] for rectal bleeding and hemodynamic instability due to rectal ulcer. On 6/15, he was deemed hemodynamically stable with adequate blood pressures without the use of pressors and was transferred out of the ICU. Hospital course was noted for the following problems outlined below:   Recurrent LGIB  Rectal Ulcer 2/2 Radiation Proctitis vs Stercoral Colitis: Patient presented to [facility] with complaints of lower GI bleeding in the setting of worsening constipation and anticoagulation use. On 6/11, patient underwent a colonoscopy that revealed a 5cm bleeding rectal ulcer with visible vessel, treated with 5 mL epinephrine injected into ulcer and hemostatic spray. He appeared to be improving until the evening of 6/12, when he had recurrence of bleeding, as well as palpitations, light-headedness, and shortness of breath with hemodynamic instability. Hbg trended from 15.7 on admission to 10.2 prior to transfer, with a drop of ~2 in 24 hours. His blood pressure initially  responded to IVF and he was transferred to [facility] further evaluation. Sigmoidoscopy on 6/14 revealed circumferential ulcer in the distal rectum with visible bleeding vesselstreated with hot biopsy forceps. Biopsies collected during flex sig revealed reactive rectal mucosa with ulceration, consistent with possible post-irradiation changes and radiation proctitis, with no signs of malignancy. Results of pathology could not rule out presence of ulcer in the setting of stercoral colitis, which would be rare, but could potentially contribute given his significant constipation history with need for manual disimpaction on 06/21/19 and associated mild rectal bleeding at that time. At discharge, his hemoglobin remained stable without symptoms of anemia and he had no episodes of rectal bleeding since 6/13. He received two doses of IV sodium ferric gluconate 250 mg and will continue oral iron supplementation on discharge to aid in recovery. He will follow-up with his primary care provider for follow-up CBC.  Permanent Afib on eliquis: Patient has a history of permanent atrial fibrillation controlled on Coreg at home. On presentation to the hospital, he was found to be in atrial fibrillation with RVR. In the ICU, his RVR was controlled with metoprolol 12.5 mg BID with heart rates between 100 and 130. After transfer to the floor, decision was made to increase metoprolol to 25 mg BID for improved control of his heart rate, with a further response of heart rate to 90 to 120. His anti-coagulation was held during the entire length of his admission at [facility] due to active bleeding and high-risk of recurrent bleeding. He has CHADS-VASC score of 5, with 7.2% risk of stroke per year, but need to weigh  risk of stroke versus risk of recurrent GI bleeding with Eliquis. At the time of discharge, a mutual decision was made with patient, family, and medical team to not restart anticoagulation for fear of recurrence of life-threatening  bleeding.  Prostate cancer  C/f metastatic malignancy, Incidental Liver Mass: Patient has a remote history of prostate cancer treated with radical prostatectomy with post-operative radiation therapy in 8099 complicated by radiation cystitis with no evidence of malignancy in bladder. Prior to admission, patient had experienced no signs of recurrence since initial treatment. With new rectal bleeding, there was initial concern for recurrence of cancer with direct extension to rectum, however, pathology shows no signs of malignancy. CT abdomen/pelvis on 6/13 revealed an exophytic lesion arising from R hepatic lobe, as well as right iliac and retroperitoneal lymphadenopathy. In the setting of elevated PSA to 81.4 (previously 7.9 in June 2019), his newly identified mass raises concern for recurrence and metastasis of his previously identified prostate malignancy. Although he has known history of prostate cancer, his liver lesion and lymphadenopathy may also represent new malignancy with unknown primary lesion. MRI Abdomen/Pelvis w/wo contrast was completed on 6/17 for further characterization of the lesion. The final read of the imaging was pending at the time of discharge. Patient was instructed to follow-up with primary care provider to decide if his imaging warrants involvement of oncology. "    Current Medication: Outpatient Encounter Medications as of 07/12/2019  Medication Sig Note  . albuterol (VENTOLIN HFA) 108 (90 Base) MCG/ACT inhaler Inhale 2 puffs into the lungs every 4 (four) hours as needed for wheezing or shortness of breath.   Marland Kitchen atorvastatin (LIPITOR) 40 MG tablet Take 1 tablet (40 mg total) by mouth daily at 6 PM.   . docusate sodium (COLACE) 100 MG capsule Take 1 capsule (100 mg total) by mouth 2 (two) times daily.   . ferrous sulfate 325 (65 FE) MG tablet Take 1 tablet by mouth 1 day or 1 dose.   Marland Kitchen glucose blood (ONETOUCH VERIO) test strip USE ONCE DAILY. DX E11.65.   . pantoprazole  (PROTONIX) 40 MG tablet TAKE 1 TABLET(40 MG) BY MOUTH DAILY   . sitaGLIPtin (JANUVIA) 25 MG tablet Take 1 tablet (25 mg total) by mouth daily. 06/30/2019: New medication, Due to start yesterday.   . torsemide (DEMADEX) 20 MG tablet Take 20 mg by mouth daily.   . [DISCONTINUED] carvedilol (COREG) 25 MG tablet Take 25 mg by mouth 2 (two) times daily with a meal.    . [DISCONTINUED] ELIQUIS 2.5 MG TABS tablet Take 2.5 mg by mouth 2 (two) times daily.   . cephALEXin (KEFLEX) 500 MG capsule Take 1 capsule (500 mg total) by mouth 2 (two) times daily.    No facility-administered encounter medications on file as of 07/12/2019.    Surgical History: Past Surgical History:  Procedure Laterality Date  . CATARACT EXTRACTION    . COLONOSCOPY WITH PROPOFOL N/A 07/02/2019   Procedure: COLONOSCOPY WITH PROPOFOL;  Surgeon: Lin Landsman, MD;  Location: [facility] ENDOSCOPY;  Service: Gastroenterology;  Laterality: N/A;  . PROSTATE CANCER    . PROSTATE SURGERY      Medical History: Past Medical History:  Diagnosis Date  . Atrial fibrillation ([place])   . CHF (congestive heart failure) ([place])   . Diabetes ([place])   . Hearing loss   . High blood pressure   . History of bladder problems   . Kidney stones 11/22/2014    Family History: Family History  Problem Relation Age  of Onset  . Colon cancer Mother   . Diabetes Other   . High blood pressure Other   . Prostate cancer Brother   . Diabetes Brother     Social History   Socioeconomic History  . Marital status: Married    Spouse name: Not on file  . Number of children: Not on file  . Years of education: Not on file  . Highest education level: Not on file  Occupational History  . Occupation: [employer]  Tobacco Use  . Smoking status: Former Smoker    Years: 16.00    Types: Cigarettes  . Smokeless tobacco: Never Used  Vaping Use  . Vaping Use: Never used  Substance and Sexual Activity  . Alcohol use: No  . Drug use: No  . Sexual activity: Not  Currently  Other Topics Concern  . Not on file  Social History Narrative  . Not on file   Social Determinants of Health   Financial Resource Strain:   . Difficulty of Paying Living Expenses:   Food Insecurity:   . Worried About Charity fundraiser in the Last Year:   . Arboriculturist in the Last Year:   Transportation Needs:   . Film/video editor (Medical):   Marland Kitchen Lack of Transportation (Non-Medical):   Physical Activity:   . Days of Exercise per Week:   . Minutes of Exercise per Session:   Stress:   . Feeling of Stress :   Social Connections:   . Frequency of Communication with Friends and Family:   . Frequency of Social Gatherings with Friends and Family:   . Attends Religious Services:   . Active Member of Clubs or Organizations:   . Attends Archivist Meetings:   Marland Kitchen Marital Status:   Intimate Partner Violence:   . Fear of Current or Ex-Partner:   . Emotionally Abused:   Marland Kitchen Physically Abused:   . Sexually Abused:       Review of Systems  Constitutional: Negative.  Negative for chills, fatigue and unexpected weight change.  HENT: Negative.  Negative for congestion, rhinorrhea, sneezing and sore throat.   Eyes: Negative for redness.  Respiratory: Negative.  Negative for cough, chest tightness and shortness of breath.   Cardiovascular: Negative.  Negative for chest pain and palpitations.  Gastrointestinal: Negative.  Negative for abdominal pain, constipation, diarrhea, nausea and vomiting.  Endocrine: Negative.   Genitourinary: Negative.  Negative for dysuria and frequency.  Musculoskeletal: Negative.  Negative for arthralgias, back pain, joint swelling and neck pain.  Skin: Negative.  Negative for rash.  Allergic/Immunologic: Negative.   Neurological: Negative.  Negative for tremors and numbness.  Hematological: Negative for adenopathy. Does not bruise/bleed easily.  Psychiatric/Behavioral: Negative.  Negative for behavioral problems, sleep disturbance  and suicidal ideas. The patient is not nervous/anxious.     Vital Signs: BP 103/69   Pulse 76   Temp (!) 97.2 F (36.2 C)   Resp 16   Ht 6' (1.829 m)   Wt 235 lb (106.6 kg)   SpO2 97%   BMI 31.87 kg/m    Physical Exam Vitals and nursing note reviewed.  Constitutional:      General: He is not in acute distress.    Appearance: He is well-developed. He is not diaphoretic.  HENT:     Head: Normocephalic and atraumatic.     Mouth/Throat:     Pharynx: No oropharyngeal exudate.  Eyes:     Pupils: Pupils are equal, round,  and reactive to light.  Neck:     Thyroid: No thyromegaly.     Vascular: No JVD.     Trachea: No tracheal deviation.  Cardiovascular:     Rate and Rhythm: Normal rate and regular rhythm.     Heart sounds: Normal heart sounds. No murmur heard.  No friction rub. No gallop.   Pulmonary:     Effort: Pulmonary effort is normal. No respiratory distress.     Breath sounds: Normal breath sounds. No wheezing or rales.  Chest:     Chest wall: No tenderness.  Abdominal:     Palpations: Abdomen is soft.     Tenderness: There is no abdominal tenderness. There is no guarding.  Musculoskeletal:        General: Normal range of motion.     Cervical back: Normal range of motion and neck supple.  Lymphadenopathy:     Cervical: No cervical adenopathy.  Skin:    General: Skin is warm and dry.  Neurological:     Mental Status: He is alert and oriented to person, place, and time.     Cranial Nerves: No cranial nerve deficit.  Psychiatric:        Behavior: Behavior normal.        Thought Content: Thought content normal.        Judgment: Judgment normal.    Assessment/Plan: 1. Low hemoglobin Continue present management, recheck CBC.  - ferrous sulfate 325 (65 FE) MG tablet; Take 1 tablet by mouth 1 day or 1 dose. - CBC w/Diff/Platelet  2. Cellulitis of left upper extremity Take Keflex as directed for upper extremity. - cephALEXin (KEFLEX) 500 MG capsule; Take 1  capsule (500 mg total) by mouth 2 (two) times daily.  Dispense: 10 capsule; Refill: 0  3. Hx of prostatectomy Pt needs referral to Oncology for MRI findings that include spot on liver and sacrum.  - PSA - Ambulatory referral to Oncology  4. Type 2 diabetes mellitus with stage 3b chronic kidney disease, without long-term current use of insulin ([place]) Continue present management, follow up as scheduled for A1C check.  5. Essential hypertension Stable, continue present management at this time.   General Counseling: javoni lucken understanding of the findings of todays visit and agrees with plan of treatment. I have discussed any further diagnostic evaluation that may be needed or ordered today. We also reviewed his medications today. he has been encouraged to call the office with any questions or concerns that should arise related to todays visit.    Orders Placed This Encounter  Procedures  . CBC w/Diff/Platelet  . PSA  . Ambulatory referral to Oncology    Meds ordered this encounter  Medications  . cephALEXin (KEFLEX) 500 MG capsule    Sig: Take 1 capsule (500 mg total) by mouth 2 (two) times daily.    Dispense:  10 capsule    Refill:  0    Time spent: 30 Minutes   This patient was seen by Orson Gear AGNP-C in Collaboration with Dr Lavera Guise as a part of collaborative care agreement     Kendell Bane AGNP-C Internal medicine

## 2019-07-13 ENCOUNTER — Ambulatory Visit: Payer: Medicare Other | Admitting: Adult Health

## 2019-07-14 DIAGNOSIS — K626 Ulcer of anus and rectum: Secondary | ICD-10-CM | POA: Diagnosis not present

## 2019-07-14 DIAGNOSIS — Z7984 Long term (current) use of oral hypoglycemic drugs: Secondary | ICD-10-CM | POA: Diagnosis not present

## 2019-07-14 DIAGNOSIS — K5900 Constipation, unspecified: Secondary | ICD-10-CM | POA: Diagnosis not present

## 2019-07-14 DIAGNOSIS — I4821 Permanent atrial fibrillation: Secondary | ICD-10-CM | POA: Diagnosis not present

## 2019-07-14 DIAGNOSIS — Z87891 Personal history of nicotine dependence: Secondary | ICD-10-CM | POA: Diagnosis not present

## 2019-07-14 DIAGNOSIS — I130 Hypertensive heart and chronic kidney disease with heart failure and stage 1 through stage 4 chronic kidney disease, or unspecified chronic kidney disease: Secondary | ICD-10-CM | POA: Diagnosis not present

## 2019-07-14 DIAGNOSIS — K5730 Diverticulosis of large intestine without perforation or abscess without bleeding: Secondary | ICD-10-CM | POA: Diagnosis not present

## 2019-07-14 DIAGNOSIS — Z9981 Dependence on supplemental oxygen: Secondary | ICD-10-CM | POA: Diagnosis not present

## 2019-07-14 DIAGNOSIS — M1612 Unilateral primary osteoarthritis, left hip: Secondary | ICD-10-CM | POA: Diagnosis not present

## 2019-07-14 DIAGNOSIS — I5042 Chronic combined systolic (congestive) and diastolic (congestive) heart failure: Secondary | ICD-10-CM | POA: Diagnosis not present

## 2019-07-14 DIAGNOSIS — G4733 Obstructive sleep apnea (adult) (pediatric): Secondary | ICD-10-CM | POA: Diagnosis not present

## 2019-07-14 DIAGNOSIS — J438 Other emphysema: Secondary | ICD-10-CM | POA: Diagnosis not present

## 2019-07-14 DIAGNOSIS — E1122 Type 2 diabetes mellitus with diabetic chronic kidney disease: Secondary | ICD-10-CM | POA: Diagnosis not present

## 2019-07-14 DIAGNOSIS — N1832 Chronic kidney disease, stage 3b: Secondary | ICD-10-CM | POA: Diagnosis not present

## 2019-07-14 DIAGNOSIS — R160 Hepatomegaly, not elsewhere classified: Secondary | ICD-10-CM | POA: Diagnosis not present

## 2019-07-16 DIAGNOSIS — I4821 Permanent atrial fibrillation: Secondary | ICD-10-CM | POA: Diagnosis not present

## 2019-07-16 DIAGNOSIS — I5042 Chronic combined systolic (congestive) and diastolic (congestive) heart failure: Secondary | ICD-10-CM | POA: Diagnosis not present

## 2019-07-16 DIAGNOSIS — G4733 Obstructive sleep apnea (adult) (pediatric): Secondary | ICD-10-CM | POA: Diagnosis not present

## 2019-07-16 DIAGNOSIS — I130 Hypertensive heart and chronic kidney disease with heart failure and stage 1 through stage 4 chronic kidney disease, or unspecified chronic kidney disease: Secondary | ICD-10-CM | POA: Diagnosis not present

## 2019-07-16 DIAGNOSIS — E1122 Type 2 diabetes mellitus with diabetic chronic kidney disease: Secondary | ICD-10-CM | POA: Diagnosis not present

## 2019-07-16 DIAGNOSIS — Z9981 Dependence on supplemental oxygen: Secondary | ICD-10-CM | POA: Diagnosis not present

## 2019-07-16 DIAGNOSIS — M1612 Unilateral primary osteoarthritis, left hip: Secondary | ICD-10-CM | POA: Diagnosis not present

## 2019-07-16 DIAGNOSIS — K5900 Constipation, unspecified: Secondary | ICD-10-CM | POA: Diagnosis not present

## 2019-07-16 DIAGNOSIS — K626 Ulcer of anus and rectum: Secondary | ICD-10-CM | POA: Diagnosis not present

## 2019-07-16 DIAGNOSIS — R160 Hepatomegaly, not elsewhere classified: Secondary | ICD-10-CM | POA: Diagnosis not present

## 2019-07-16 DIAGNOSIS — N1832 Chronic kidney disease, stage 3b: Secondary | ICD-10-CM | POA: Diagnosis not present

## 2019-07-16 DIAGNOSIS — K5730 Diverticulosis of large intestine without perforation or abscess without bleeding: Secondary | ICD-10-CM | POA: Diagnosis not present

## 2019-07-16 DIAGNOSIS — Z7984 Long term (current) use of oral hypoglycemic drugs: Secondary | ICD-10-CM | POA: Diagnosis not present

## 2019-07-16 DIAGNOSIS — J438 Other emphysema: Secondary | ICD-10-CM | POA: Diagnosis not present

## 2019-07-16 DIAGNOSIS — Z87891 Personal history of nicotine dependence: Secondary | ICD-10-CM | POA: Diagnosis not present

## 2019-07-19 ENCOUNTER — Encounter: Payer: Self-pay | Admitting: Oncology

## 2019-07-19 ENCOUNTER — Inpatient Hospital Stay: Payer: Medicare Other

## 2019-07-19 ENCOUNTER — Inpatient Hospital Stay: Payer: Medicare Other | Attending: Oncology | Admitting: Oncology

## 2019-07-19 ENCOUNTER — Other Ambulatory Visit: Payer: Self-pay

## 2019-07-19 VITALS — BP 120/68 | HR 70 | Temp 95.8°F | Resp 16 | Ht 72.24 in | Wt 235.4 lb

## 2019-07-19 DIAGNOSIS — K625 Hemorrhage of anus and rectum: Secondary | ICD-10-CM | POA: Diagnosis not present

## 2019-07-19 DIAGNOSIS — J439 Emphysema, unspecified: Secondary | ICD-10-CM | POA: Insufficient documentation

## 2019-07-19 DIAGNOSIS — K5900 Constipation, unspecified: Secondary | ICD-10-CM | POA: Diagnosis not present

## 2019-07-19 DIAGNOSIS — R160 Hepatomegaly, not elsewhere classified: Secondary | ICD-10-CM | POA: Diagnosis not present

## 2019-07-19 DIAGNOSIS — R972 Elevated prostate specific antigen [PSA]: Secondary | ICD-10-CM

## 2019-07-19 DIAGNOSIS — I4891 Unspecified atrial fibrillation: Secondary | ICD-10-CM | POA: Diagnosis not present

## 2019-07-19 DIAGNOSIS — Z9079 Acquired absence of other genital organ(s): Secondary | ICD-10-CM | POA: Diagnosis not present

## 2019-07-19 DIAGNOSIS — Z79899 Other long term (current) drug therapy: Secondary | ICD-10-CM | POA: Insufficient documentation

## 2019-07-19 DIAGNOSIS — K769 Liver disease, unspecified: Secondary | ICD-10-CM

## 2019-07-19 DIAGNOSIS — D649 Anemia, unspecified: Secondary | ICD-10-CM | POA: Diagnosis not present

## 2019-07-19 DIAGNOSIS — Z7901 Long term (current) use of anticoagulants: Secondary | ICD-10-CM | POA: Insufficient documentation

## 2019-07-19 DIAGNOSIS — Z87891 Personal history of nicotine dependence: Secondary | ICD-10-CM | POA: Diagnosis not present

## 2019-07-19 DIAGNOSIS — Z8546 Personal history of malignant neoplasm of prostate: Secondary | ICD-10-CM | POA: Insufficient documentation

## 2019-07-19 DIAGNOSIS — R590 Localized enlarged lymph nodes: Secondary | ICD-10-CM | POA: Diagnosis not present

## 2019-07-19 DIAGNOSIS — Z923 Personal history of irradiation: Secondary | ICD-10-CM | POA: Insufficient documentation

## 2019-07-19 DIAGNOSIS — I509 Heart failure, unspecified: Secondary | ICD-10-CM | POA: Insufficient documentation

## 2019-07-19 DIAGNOSIS — Z7984 Long term (current) use of oral hypoglycemic drugs: Secondary | ICD-10-CM | POA: Insufficient documentation

## 2019-07-19 DIAGNOSIS — E119 Type 2 diabetes mellitus without complications: Secondary | ICD-10-CM | POA: Diagnosis not present

## 2019-07-19 LAB — CBC WITH DIFFERENTIAL/PLATELET
Abs Immature Granulocytes: 0.01 10*3/uL (ref 0.00–0.07)
Basophils Absolute: 0 10*3/uL (ref 0.0–0.1)
Basophils Relative: 0 %
Eosinophils Absolute: 0.1 10*3/uL (ref 0.0–0.5)
Eosinophils Relative: 3 %
HCT: 31.1 % — ABNORMAL LOW (ref 39.0–52.0)
Hemoglobin: 9.5 g/dL — ABNORMAL LOW (ref 13.0–17.0)
Immature Granulocytes: 0 %
Lymphocytes Relative: 29 %
Lymphs Abs: 1.4 10*3/uL (ref 0.7–4.0)
MCH: 29.7 pg (ref 26.0–34.0)
MCHC: 30.5 g/dL (ref 30.0–36.0)
MCV: 97.2 fL (ref 80.0–100.0)
Monocytes Absolute: 0.7 10*3/uL (ref 0.1–1.0)
Monocytes Relative: 15 %
Neutro Abs: 2.5 10*3/uL (ref 1.7–7.7)
Neutrophils Relative %: 53 %
Platelets: 352 10*3/uL (ref 150–400)
RBC: 3.2 MIL/uL — ABNORMAL LOW (ref 4.22–5.81)
RDW: 17.4 % — ABNORMAL HIGH (ref 11.5–15.5)
WBC: 4.8 10*3/uL (ref 4.0–10.5)
nRBC: 0.4 % — ABNORMAL HIGH (ref 0.0–0.2)

## 2019-07-19 LAB — COMPREHENSIVE METABOLIC PANEL
ALT: 21 U/L (ref 0–44)
AST: 16 U/L (ref 15–41)
Albumin: 3.3 g/dL — ABNORMAL LOW (ref 3.5–5.0)
Alkaline Phosphatase: 115 U/L (ref 38–126)
Anion gap: 7 (ref 5–15)
BUN: 31 mg/dL — ABNORMAL HIGH (ref 8–23)
CO2: 26 mmol/L (ref 22–32)
Calcium: 9 mg/dL (ref 8.9–10.3)
Chloride: 109 mmol/L (ref 98–111)
Creatinine, Ser: 2.01 mg/dL — ABNORMAL HIGH (ref 0.61–1.24)
GFR calc Af Amer: 34 mL/min — ABNORMAL LOW (ref >60)
GFR calc non Af Amer: 30 mL/min — ABNORMAL LOW (ref >60)
Glucose, Bld: 90 mg/dL (ref 70–99)
Potassium: 5 mmol/L (ref 3.5–5.1)
Sodium: 142 mmol/L (ref 135–145)
Total Bilirubin: 0.5 mg/dL (ref 0.3–1.2)
Total Protein: 7.4 g/dL (ref 6.5–8.1)

## 2019-07-19 LAB — IRON AND TIBC
Iron: 59 ug/dL (ref 45–182)
Saturation Ratios: 20 % (ref 17.9–39.5)
TIBC: 294 ug/dL (ref 250–450)
UIBC: 235 ug/dL

## 2019-07-19 LAB — PSA: Prostatic Specific Antigen: 49.71 ng/mL — ABNORMAL HIGH (ref 0.00–4.00)

## 2019-07-19 LAB — FERRITIN: Ferritin: 228 ng/mL (ref 24–336)

## 2019-07-19 NOTE — Progress Notes (Signed)
Hematology/Oncology Consult note [facility] Telephone:([phone] [phone]   Patient Care Team: Ronnell Freshwater, NP as PCP - General (Family Medicine)  REFERRING PROVIDER: Lavera Guise, MD  CHIEF COMPLAINTS/REASON FOR VISIT:  Evaluation of history of prostate cancer, elevated PSA  HISTORY OF PRESENTING ILLNESS:   [name] is a  86 y.o.  male with PMH listed below was seen in consultation at the request of  Lavera Guise, MD  for evaluation of history of prostate cancer with elevated PSA  Patient was accompanied by his wife.  They are both poor historian regarding patient's remote history of prostate cancer.  They recall that patient was diagnosed in 2000s, status post prostatectomy by [name] as well as prostate radiation.  He cannot recall any details.  He denies ever being on androgen deprivation therapy.  Patient recently presented to [facility] on 07/04/2019 for rectal bleeding. Patient has a history of diverticulosis, atrial fibrillation on Eliquis.  Colonoscopy showed 5 cm bleeding rectal ulcer with visible vessels.  Was treated with epinephrine injection into the ulcer and hemostatic spray.  Patient was transferred to [facility] due to hemodynamic instability and stayed until 07/08/2019 when he was discharged.  Patient was admitted to [facility] MICU.  Sigmoidoscopy on 614 revealed circumferential ulcer distal rectum with visible bleeding vessels treated with hot biopsy forceps. Pathology cannot rule out presence of ulcer in the setting of stercoral colitis.  No malignancy was found.  Patient received ferric gluconate 250 mg and was continued on oral iron supplementation at discharge.  Patient was hemodynamically stable and was discharged.  The rectal bleeding is considered to be secondary to radiation proctitis versus stercoral colitis. Eliquis was discontinued after weighing benefit of stroke prevention versus recurrent GI bleeding with Eliquis.  At the time  of discharge, a mutual decision was made with patient/family/medical team to not to restart Eliquis.  06/20/2019, CT chest without contrast showed incidental hypodense peripheral right liver 4.9 cm mass.  As well as 3.2 cm anterior left liver cyst. 07/05/2019 CT abdomen pelvis without contrast showed multiple small hypoattenuating lesions in the liver which are too small to accurately characterize.  There are hypoattenuating lesion in the represent cysts.  There is an exophytic lesion arising from the right hepatic lobe measuring 4.6 x 5.6 cm.  There are multiple enlarged right iliac and retroperitoneal lymph nodes 2.5 cm right iliac lymph nodes, 1.8 cm right iliac lymph node, 1.9 pericaval node new since prior study. 07/08/2019 MRI abdomen pelvis with and without contrast showed exophytic lesion arising from the right hepatic lobe measuring up to 4.6cm , demonstrating subtle heterogeneous enhancement and washout.  Suspicious for metastasis.  Additional there is superimposed hemorrhage within the lesion. Retroperitoneal and right iliac chain metastasis lymphadenopathy Marked mass-effect with probable small nonocclusive thrombus in the right external iliac vein in the area of right external iliac lymphadenopathy.  0.9 cm enhancing lesion apex of sacrum with associated restricted diffusion may reflect metastatic osseous lesions.   06/11/2019, PSA was 47.6.  Patient was referred to establish care with oncology for further evaluation and management. Today patient reports feeling well.  Denies any fever, chills, unintentional weight loss, night sweating, pain, Patient had left upper extremity cellulitis secondary to IV sites.  He has taken a course of Keflex and the symptoms have significantly improved. Patient denies any bone pain. Review of Systems  Constitutional: Negative for appetite change, chills, fatigue, fever and unexpected weight change.  HENT:   Negative  for hearing loss and voice change.     Eyes: Negative for eye problems and icterus.  Respiratory: Negative for chest tightness, cough and shortness of breath.   Cardiovascular: Negative for chest pain and leg swelling.  Gastrointestinal: Negative for abdominal distention and abdominal pain.  Endocrine: Negative for hot flashes.  Genitourinary: Negative for difficulty urinating, dysuria and frequency.   Musculoskeletal: Negative for arthralgias.  Skin: Negative for itching and rash.  Neurological: Negative for light-headedness and numbness.  Hematological: Negative for adenopathy. Does not bruise/bleed easily.  Psychiatric/Behavioral: Negative for confusion.    MEDICAL HISTORY:  Past Medical History:  Diagnosis Date  . Atrial fibrillation ([place])   . CHF (congestive heart failure) ([place])   . Diabetes ([place])   . Hearing loss   . High blood pressure   . History of bladder problems   . Kidney stones 11/22/2014    SURGICAL HISTORY: Past Surgical History:  Procedure Laterality Date  . CATARACT EXTRACTION    . COLONOSCOPY WITH PROPOFOL N/A 07/02/2019   Procedure: COLONOSCOPY WITH PROPOFOL;  Surgeon: Lin Landsman, MD;  Location: [facility] ENDOSCOPY;  Service: Gastroenterology;  Laterality: N/A;  . PROSTATE CANCER    . PROSTATE SURGERY      SOCIAL HISTORY: Social History   Socioeconomic History  . Marital status: Married    Spouse name: Not on file  . Number of children: Not on file  . Years of education: Not on file  . Highest education level: Not on file  Occupational History  . Occupation: [employer]  Tobacco Use  . Smoking status: Former Smoker    Years: 16.00    Types: Cigarettes  . Smokeless tobacco: Never Used  Vaping Use  . Vaping Use: Never used  Substance and Sexual Activity  . Alcohol use: No  . Drug use: No  . Sexual activity: Not Currently  Other Topics Concern  . Not on file  Social History Narrative  . Not on file   Social Determinants of Health   Financial Resource Strain:   . Difficulty  of Paying Living Expenses:   Food Insecurity:   . Worried About Charity fundraiser in the Last Year:   . Arboriculturist in the Last Year:   Transportation Needs:   . Film/video editor (Medical):   Marland Kitchen Lack of Transportation (Non-Medical):   Physical Activity:   . Days of Exercise per Week:   . Minutes of Exercise per Session:   Stress:   . Feeling of Stress :   Social Connections:   . Frequency of Communication with Friends and Family:   . Frequency of Social Gatherings with Friends and Family:   . Attends Religious Services:   . Active Member of Clubs or Organizations:   . Attends Archivist Meetings:   Marland Kitchen Marital Status:   Intimate Partner Violence:   . Fear of Current or Ex-Partner:   . Emotionally Abused:   Marland Kitchen Physically Abused:   . Sexually Abused:     FAMILY HISTORY: Family History  Problem Relation Age of Onset  . Colon cancer Mother   . Diabetes Other   . High blood pressure Other   . Prostate cancer Brother   . Diabetes Brother     ALLERGIES:  has No Known Allergies.  MEDICATIONS:  Current Outpatient Medications  Medication Sig Dispense Refill  . albuterol (VENTOLIN HFA) 108 (90 Base) MCG/ACT inhaler Inhale 2 puffs into the lungs every 4 (four) hours as needed for  wheezing or shortness of breath. 18 g 1  . atorvastatin (LIPITOR) 40 MG tablet Take 1 tablet (40 mg total) by mouth daily at 6 PM. 30 tablet 5  . ferrous sulfate 325 (65 FE) MG tablet Take 1 tablet by mouth 1 day or 1 dose.    Marland Kitchen glucose blood (ONETOUCH VERIO) test strip USE ONCE DAILY. DX E11.65. 100 each 3  . metoprolol tartrate (LOPRESSOR) 25 MG tablet Take by mouth.    . polyethylene glycol powder (GLYCOLAX/MIRALAX) 17 GM/SCOOP powder Take by mouth.    . sitaGLIPtin (JANUVIA) 25 MG tablet Take 1 tablet (25 mg total) by mouth daily. 30 tablet 3  . cephALEXin (KEFLEX) 500 MG capsule Take 1 capsule (500 mg total) by mouth 2 (two) times daily. (Patient not taking: Reported on 07/19/2019)  10 capsule 0  . docusate sodium (COLACE) 100 MG capsule Take 1 capsule (100 mg total) by mouth 2 (two) times daily. (Patient not taking: Reported on 07/19/2019) 60 capsule 2  . pantoprazole (PROTONIX) 40 MG tablet TAKE 1 TABLET(40 MG) BY MOUTH DAILY (Patient not taking: Reported on 07/19/2019) 30 tablet 6  . torsemide (DEMADEX) 20 MG tablet Take 20 mg by mouth daily. (Patient not taking: Reported on 07/19/2019)     No current facility-administered medications for this visit.     PHYSICAL EXAMINATION: ECOG PERFORMANCE STATUS: 1 - Symptomatic but completely ambulatory Vitals:   07/19/19 0943  BP: 120/68  Pulse: 70  Resp: 16  Temp: (!) 95.8 F (35.4 C)   Filed Weights   07/19/19 0943  Weight: 235 lb 6.4 oz (106.8 kg)    Physical Exam Constitutional:      General: He is not in acute distress. HENT:     Head: Normocephalic and atraumatic.  Eyes:     General: No scleral icterus. Cardiovascular:     Rate and Rhythm: Normal rate and regular rhythm.     Heart sounds: Normal heart sounds.  Pulmonary:     Effort: Pulmonary effort is normal. No respiratory distress.     Breath sounds: No wheezing.  Abdominal:     General: Bowel sounds are normal. There is no distension.     Palpations: Abdomen is soft.  Musculoskeletal:        General: No deformity. Normal range of motion.     Cervical back: Normal range of motion and neck supple.  Skin:    General: Skin is warm and dry.     Findings: No erythema or rash.  Neurological:     Mental Status: He is alert and oriented to person, place, and time. Mental status is at baseline.     Cranial Nerves: No cranial nerve deficit.     Coordination: Coordination normal.  Psychiatric:        Mood and Affect: Mood normal.     LABORATORY DATA:  I have reviewed the data as listed Lab Results  Component Value Date   WBC 4.8 07/19/2019   HGB 9.5 (L) 07/19/2019   HCT 31.1 (L) 07/19/2019   MCV 97.2 07/19/2019   PLT 352 07/19/2019   Recent  Labs    06/30/19 1620 06/30/19 1620 07/01/19 0456 07/02/19 0446 07/03/19 0453 07/04/19 0320 07/19/19 1052  NA 140   < > 139   < > 139 136 142  K 4.5   < > 4.0   < > 4.1 4.7 5.0  CL 101   < > 103   < > 102 103 109  CO2 28   < >  26   < > 27 27 26   GLUCOSE 138*   < > 128*   < > 115* 162* 90  BUN 52*   < > 49*   < > 38* 34* 31*  CREATININE 2.75*   < > 2.45*   < > 2.33* 2.44* 2.01*  CALCIUM 9.0   < > 9.0   < > 8.6* 8.4* 9.0  GFRNONAA 20*   < > 23*   < > 25* 23* 30*  GFRAA 23*   < > 27*   < > 29* 27* 34*  PROT 7.9  --  7.4  --   --   --  7.4  ALBUMIN 3.5  --  3.3*  --   --   --  3.3*  AST 24  --  21  --   --   --  16  ALT 35  --  31  --   --   --  21  ALKPHOS 112  --  105  --   --   --  115  BILITOT 0.9  --  0.5  --   --   --  0.5   < > = values in this interval not displayed.   Iron/TIBC/Ferritin/ %Sat No results found for: IRON, TIBC, FERRITIN, IRONPCTSAT    RADIOGRAPHIC STUDIES: I have personally reviewed the radiological images as listed and agreed with the findings in the report. DG Chest 2 View  Result Date: 06/20/2019 CLINICAL DATA:  Chest pain EXAM: CHEST - 2 VIEW COMPARISON:  None. FINDINGS: There is mild cardiomegaly. Prominence of the central pulmonary vasculature seen. No large airspace consolidation or pleural effusion. Stable elevation of the left hemidiaphragm. No acute osseous abnormality. IMPRESSION: Mild cardiomegaly and pulmonary vascular congestion. Electronically Signed   By: Prudencio Pair M.D.   On: 06/20/2019 00:06   DG Abd 1 View  Result Date: 06/30/2019 CLINICAL DATA:  Constipation. EXAM: ABDOMEN - 1 VIEW COMPARISON:  None. FINDINGS: The bowel gas pattern is normal. A very mild amount of stool is seen within the large bowel. No radio-opaque calculi or other significant radiographic abnormality are seen. Multiple surgical clips are seen within the lower pelvis. IMPRESSION: Very mild stool burden without evidence of bowel obstruction. Electronically Signed   By:  Virgina Norfolk M.D.   On: 06/30/2019 22:37   DG Abdomen 1 View  Result Date: 06/21/2019 CLINICAL DATA:  Constipation. EXAM: ABDOMEN - 1 VIEW COMPARISON:  CT abdomen pelvis 02/15/2018, abdominal radiograph 08/07/2017 FINDINGS: There are no dilated loops of bowel to suggest obstruction. No supine evidence for free air. Moderate amount of stool throughout the colon. Surgical clips are noted throughout the lower pelvis. No radiopaque renal calculus. No acute finding in the visualized skeleton. IMPRESSION: Nonobstructive bowel gas pattern. Moderate stool burden. Electronically Signed   By: Audie Pinto M.D.   On: 06/21/2019 13:49   CT Chest Wo Contrast  Result Date: 06/20/2019 CLINICAL DATA:  Sudden onset chest pain.  History of CHF. EXAM: CT CHEST WITHOUT CONTRAST TECHNIQUE: Multidetector CT imaging of the chest was performed following the standard protocol without IV contrast. COMPARISON:  Chest radiograph from one day prior. 04/13/2018 chest CT. FINDINGS: Cardiovascular: Mild cardiomegaly. No significant pericardial effusion/thickening. Left anterior descending and right coronary atherosclerosis. Atherosclerotic nonaneurysmal thoracic aorta. Top-normal caliber main pulmonary artery (3.2 cm diameter). Mediastinum/Nodes: No discrete thyroid nodules. Unremarkable esophagus. No pathologically enlarged axillary, mediastinal or hilar lymph nodes, noting limited sensitivity for the detection of hilar adenopathy on  this noncontrast study. Lungs/Pleura: No pneumothorax. No pleural effusion. Mild paraseptal emphysema with diffuse bronchial wall thickening. No acute consolidative airspace disease, lung masses or significant pulmonary nodules. Mild patchy subpleural reticulation in the lower lungs with associated scattered small parenchymal bands, not substantially changed. Upper abdomen: Simple 3.2 cm anterior left liver cyst. Partially visualized hypodense peripheral right liver 4.9 cm mass (series 2/image 143),  stable since 04/13/2018 CT, presumably benign. Nonobstructing stones in the visualized upper kidneys bilaterally, largest 5 mm in the upper left kidney. Musculoskeletal: No aggressive appearing focal osseous lesions. Moderate thoracic spondylosis. IMPRESSION: 1. No acute pulmonary disease. Chronic scattered small parenchymal bands and nonspecific subpleural reticulation in the lower lungs, favoring nonspecific postinfectious/postinflammatory scarring. 2. Mild cardiomegaly. 3. Nonobstructing bilateral nephrolithiasis. 4. Aortic Atherosclerosis (ICD10-I70.0) and Emphysema (ICD10-J43.9). Electronically Signed   By: Ilona Sorrel M.D.   On: 06/20/2019 05:43   DG Abd 2 Views  Result Date: 06/30/2019 CLINICAL DATA:  Abdominal pain, rectal bleeding EXAM: ABDOMEN - 2 VIEW COMPARISON:  06/21/2019 FINDINGS: Supine and upright frontal views of the abdomen and pelvis are obtained. Bowel gas pattern is unremarkable. No free gas in the greater peritoneal sac. No masses or abnormal calcifications. Postsurgical changes from prostatectomy. No acute bony abnormalities. IMPRESSION: 1. Unremarkable exam. Electronically Signed   By: Randa Ngo M.D.   On: 06/30/2019 17:38      ASSESSMENT & PLAN:  1. Elevated PSA   2. History of prostate cancer   3. Liver lesion, right lobe   4. Retroperitoneal lymphadenopathy    Extensive medical [facility] review was performed by me. His previous records of prostate cancer is not currently available EMR.  We will need to obtain old records. Per history, he is status post prostatectomy as well as adjuvant radiation.  Never taken androgen deprivation therapy. Mildly persistently elevated PSA, suspicious for prostate cancer recurrence.  Images were independently reviewed by me.  His liver mass and retroperitoneal lymphadenopathy are concerning for metastasis. I will obtain Axumin PET scan repeat PSA, testosterone, CBC and CMP. I discussed with patient about likely course of  starting androgen deprivation therapy.  He may need additional biopsy as well.  Anemia, hemoglobin 9.5 today.  Likely secondary to recent bleeding possible Iron deficiency as well as chronic kidney disease I will add iron panel.  Patient to follow-up for additional discussion after PET scan. Orders Placed This Encounter  Procedures  . NM PET (AXUMIN) SKULL BASE TO MID THIGH    Standing Status:   Future    Standing Expiration Date:   07/18/2020    Order Specific Question:   If indicated for the ordered procedure, I authorize the administration of a radiopharmaceutical per Radiology protocol    Answer:   Yes    Order Specific Question:   Preferred imaging location?    Answer:   [facility]    Order Specific Question:   Radiology Contrast Protocol - do NOT remove file path    Answer:   [email or web address]  . CBC with Differential/Platelet    Standing Status:   Future    Number of Occurrences:   1    Standing Expiration Date:   07/18/2020  . Comprehensive metabolic panel    Standing Status:   Future    Number of Occurrences:   1    Standing Expiration Date:   07/18/2020  . PSA    Standing Status:   Future    Number of Occurrences:   1  Standing Expiration Date:   07/18/2020  . Testosterone    Standing Status:   Future    Number of Occurrences:   1    Standing Expiration Date:   07/18/2020    All questions were answered. The patient knows to call the clinic with any problems questions or concerns.  cc Lavera Guise, MD    Return of visit: After PET scan Thank you for this kind referral and the opportunity to participate in the care of this patient. A copy of today's note is routed to referring provider    Earlie Server, MD, PhD Hematology Oncology [facility] Pager- [phone] 07/19/2019

## 2019-07-19 NOTE — Addendum Note (Signed)
Addended by: Earlie Server on: 07/19/2019 02:24 PM   Modules accepted: Orders

## 2019-07-19 NOTE — Progress Notes (Signed)
New patient evaluation.  They are unsure why they were referred to see Dr. Tasia Catchings today.  History of proctectomy but is unsure if he was diagnosed with prostate cancer.

## 2019-07-20 LAB — TESTOSTERONE: Testosterone: 74 ng/dL — ABNORMAL LOW (ref 264–916)

## 2019-07-21 DIAGNOSIS — Z9981 Dependence on supplemental oxygen: Secondary | ICD-10-CM | POA: Diagnosis not present

## 2019-07-21 DIAGNOSIS — Z87891 Personal history of nicotine dependence: Secondary | ICD-10-CM | POA: Diagnosis not present

## 2019-07-21 DIAGNOSIS — K5900 Constipation, unspecified: Secondary | ICD-10-CM | POA: Diagnosis not present

## 2019-07-21 DIAGNOSIS — E1122 Type 2 diabetes mellitus with diabetic chronic kidney disease: Secondary | ICD-10-CM | POA: Diagnosis not present

## 2019-07-21 DIAGNOSIS — I130 Hypertensive heart and chronic kidney disease with heart failure and stage 1 through stage 4 chronic kidney disease, or unspecified chronic kidney disease: Secondary | ICD-10-CM | POA: Diagnosis not present

## 2019-07-21 DIAGNOSIS — R160 Hepatomegaly, not elsewhere classified: Secondary | ICD-10-CM | POA: Diagnosis not present

## 2019-07-21 DIAGNOSIS — N1832 Chronic kidney disease, stage 3b: Secondary | ICD-10-CM | POA: Diagnosis not present

## 2019-07-21 DIAGNOSIS — J438 Other emphysema: Secondary | ICD-10-CM | POA: Diagnosis not present

## 2019-07-21 DIAGNOSIS — M1612 Unilateral primary osteoarthritis, left hip: Secondary | ICD-10-CM | POA: Diagnosis not present

## 2019-07-21 DIAGNOSIS — G4733 Obstructive sleep apnea (adult) (pediatric): Secondary | ICD-10-CM | POA: Diagnosis not present

## 2019-07-21 DIAGNOSIS — I4821 Permanent atrial fibrillation: Secondary | ICD-10-CM | POA: Diagnosis not present

## 2019-07-21 DIAGNOSIS — K626 Ulcer of anus and rectum: Secondary | ICD-10-CM | POA: Diagnosis not present

## 2019-07-21 DIAGNOSIS — Z7984 Long term (current) use of oral hypoglycemic drugs: Secondary | ICD-10-CM | POA: Diagnosis not present

## 2019-07-21 DIAGNOSIS — K5730 Diverticulosis of large intestine without perforation or abscess without bleeding: Secondary | ICD-10-CM | POA: Diagnosis not present

## 2019-07-21 DIAGNOSIS — I5042 Chronic combined systolic (congestive) and diastolic (congestive) heart failure: Secondary | ICD-10-CM | POA: Diagnosis not present

## 2019-07-22 ENCOUNTER — Encounter: Payer: Self-pay | Admitting: Oncology

## 2019-07-22 DIAGNOSIS — Z7189 Other specified counseling: Secondary | ICD-10-CM | POA: Insufficient documentation

## 2019-07-27 ENCOUNTER — Ambulatory Visit: Payer: Medicare Other | Admitting: Nurse Practitioner

## 2019-07-28 ENCOUNTER — Telehealth: Payer: Self-pay

## 2019-07-28 NOTE — Telephone Encounter (Signed)
Confirmed appointment 07/29/2019. klh

## 2019-07-29 ENCOUNTER — Ambulatory Visit: Payer: Medicare Other | Admitting: Adult Health

## 2019-07-30 ENCOUNTER — Ambulatory Visit: Payer: Medicare Other | Admitting: Nurse Practitioner

## 2019-07-30 ENCOUNTER — Ambulatory Visit (INDEPENDENT_AMBULATORY_CARE_PROVIDER_SITE_OTHER): Payer: Medicare Other | Admitting: Vascular Surgery

## 2019-07-30 ENCOUNTER — Other Ambulatory Visit (INDEPENDENT_AMBULATORY_CARE_PROVIDER_SITE_OTHER): Payer: Medicare Other

## 2019-07-30 DIAGNOSIS — R600 Localized edema: Secondary | ICD-10-CM | POA: Diagnosis not present

## 2019-07-30 DIAGNOSIS — I5022 Chronic systolic (congestive) heart failure: Secondary | ICD-10-CM | POA: Diagnosis not present

## 2019-07-30 DIAGNOSIS — E782 Mixed hyperlipidemia: Secondary | ICD-10-CM | POA: Diagnosis not present

## 2019-07-30 DIAGNOSIS — I482 Chronic atrial fibrillation, unspecified: Secondary | ICD-10-CM | POA: Diagnosis not present

## 2019-07-30 DIAGNOSIS — I10 Essential (primary) hypertension: Secondary | ICD-10-CM | POA: Diagnosis not present

## 2019-08-02 ENCOUNTER — Telehealth: Payer: Self-pay

## 2019-08-02 NOTE — Telephone Encounter (Signed)
The problem with him and the metformin and glipizide are his renal functions are not good. Either of these medications can cause them to be even worse. I almost feel like we may need to do insulin for him. If he wants to do this, he will need an appointment to discuss.

## 2019-08-03 ENCOUNTER — Telehealth: Payer: Self-pay

## 2019-08-03 ENCOUNTER — Other Ambulatory Visit: Payer: Self-pay

## 2019-08-03 ENCOUNTER — Ambulatory Visit
Admission: RE | Admit: 2019-08-03 | Discharge: 2019-08-03 | Disposition: A | Discharge status: Home / Self Care | Payer: Medicare Other | Source: Ambulatory Visit | Attending: Oncology | Admitting: Oncology

## 2019-08-03 DIAGNOSIS — R972 Elevated prostate specific antigen [PSA]: Secondary | ICD-10-CM | POA: Insufficient documentation

## 2019-08-03 DIAGNOSIS — R590 Localized enlarged lymph nodes: Secondary | ICD-10-CM | POA: Diagnosis not present

## 2019-08-03 IMAGING — PT NM PET SKULL BASE TO THIGH
1 series · 4 of 4 positions shown · non-contrast
Comparison: None.
COMPARISON: None.

Addendum:
CLINICAL DATA: Subsequent treatment strategy for recurrent prostate
cancer. Initial diagnosis 3918. Prior prostatectomy is elevated PSA

EXAM:
NUCLEAR MEDICINE PET SKULL BASE TO THIGH
TECHNIQUE: 10.6 mCi F-18 Fluciclovine was injected intravenously. Full-ring PET
imaging was performed from the skull base to thigh after the
radiotracer. CT data was obtained and used for attenuation
correction and anatomic localization.

[Series 1112: results mm oncology reading · 1.0mm · 1.00mm/px · 4 of 4 slices shown]
[im 1/4]
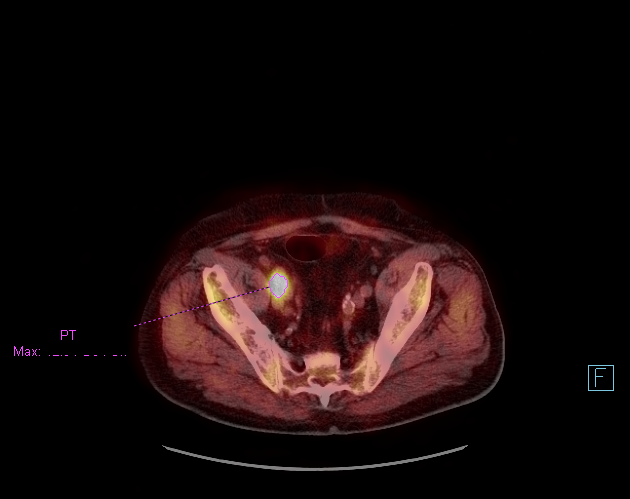
[im 2/4]
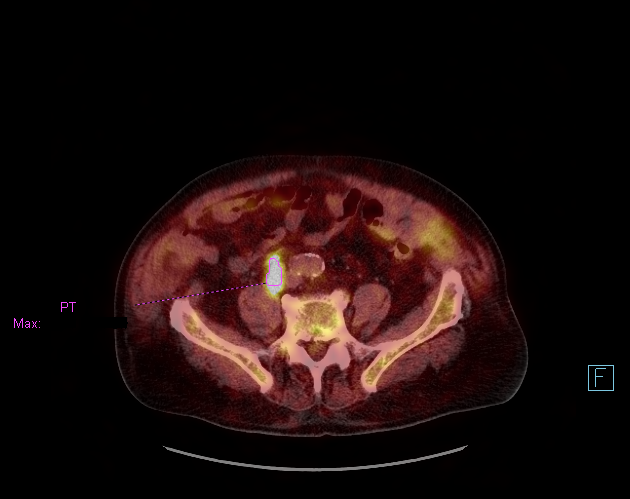
[im 3/4]
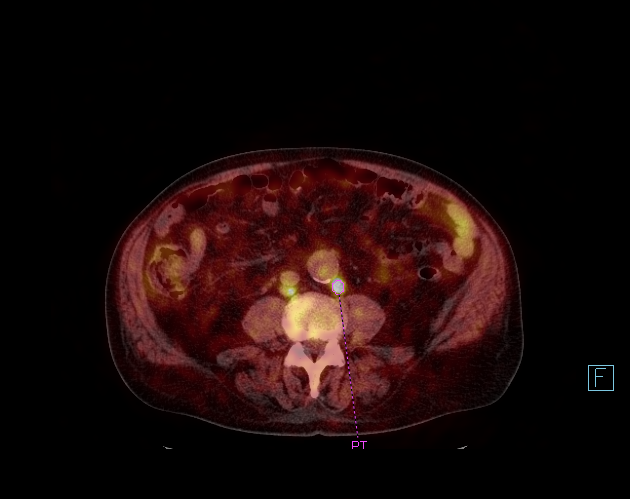
[im 4/4]
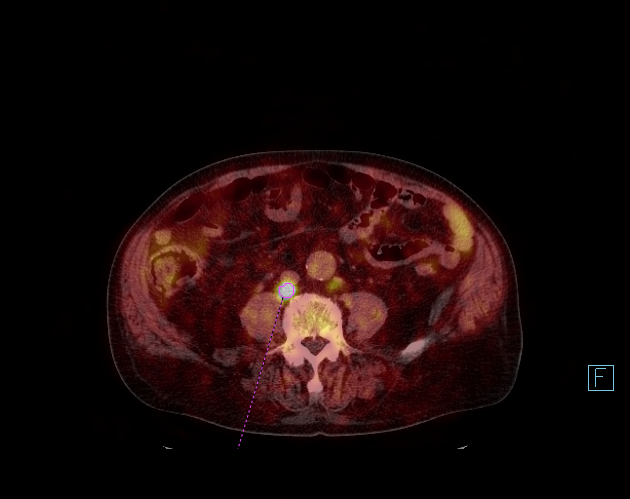

[4 of 4 positions shown; findings below may reference images not displayed]

FINDINGS: NECK

No radiotracer activity in neck lymph nodes.

Incidental CT finding: None

CHEST

No radiotracer accumulation within mediastinal or hilar lymph nodes.
No suspicious pulmonary nodules on the CT scan.

Incidental CT finding: None

ABDOMEN/PELVIS

Prostate: No focal activity in the prostate bed.

Lymph nodes: Intense radiotracer activity within a RIGHT external
iliac lymph node measuring 1.5 cm (image 221/CT series 3) with SUV
max equal 19.2. Enlarged higher RIGHT common iliac node measures
cm with SUV max equal 11.8.

Higher LEFT periaortic lymph node 0.7 cm SUV max equal 9.5. This is
the highest radiotracer avid lymph node and is below the renal
veins.

Liver: No evidence of liver metastasis

Incidental CT finding: None

SKELETON

No focal  activity to suggest skeletal metastasis.
IMPRESSION: 1. Intensely radiotracer avid RIGHT iliac adenopathy and periaortic
retroperitoneal adenopathy consistent with prostate cancer nodal
metastasis.
2. No evidence of visceral metastasis or skeletal metastasis.

ADDENDUM:
Outside MRI was made available for comparison. MRI dated 07/08/2019.

On comparison MRI there is a hemorrhagic lesion partially exophytic
from the RIGHT hepatic lobe of the liver. This lesion corresponds to
exophytic lesion on the CT portion exam measuring 4.3 cm image
145/3). This lesion DOES NOT accumulate the prostate cancer specific
radiotracer.

Additional adenopathy within the pelvis along the RIGHT iliac chain
is again demonstrated on MRI.

Indeterminate lesion exophytic from the RIGHT hepatic lobe

*** End of Addendum ***
FINDINGS: NECK

No radiotracer activity in neck lymph nodes.

Incidental CT finding: None

CHEST

No radiotracer accumulation within mediastinal or hilar lymph nodes.
No suspicious pulmonary nodules on the CT scan.

Incidental CT finding: None

ABDOMEN/PELVIS

Prostate: No focal activity in the prostate bed.

Lymph nodes: Intense radiotracer activity within a RIGHT external
iliac lymph node measuring 1.5 cm (image 221/CT series 3) with SUV
max equal 19.2. Enlarged higher RIGHT common iliac node measures
cm with SUV max equal 11.8.

Higher LEFT periaortic lymph node 0.7 cm SUV max equal 9.5. This is
the highest radiotracer avid lymph node and is below the renal
veins.

Liver: No evidence of liver metastasis

Incidental CT finding: None

SKELETON

No focal  activity to suggest skeletal metastasis.
IMPRESSION: 1. Intensely radiotracer avid RIGHT iliac adenopathy and periaortic
retroperitoneal adenopathy consistent with prostate cancer nodal
metastasis.
2. No evidence of visceral metastasis or skeletal metastasis.

## 2019-08-03 MED ORDER — AXUMIN (FLUCICLOVINE F 18) INJECTION
10.0000 | Freq: Once | INTRAVENOUS | Status: AC | PRN
  Administered 2019-08-03: 10.63 via INTRAVENOUS

## 2019-08-03 NOTE — Telephone Encounter (Signed)
Spoke to pt's wife and advised that Nira Conn would like to have pt make appt to discuss changing his januvia to something that doesn't affect his kidneys.  Beth made appt for pt.  DBS

## 2019-08-03 NOTE — Telephone Encounter (Signed)
Done

## 2019-08-04 ENCOUNTER — Telehealth: Payer: Self-pay

## 2019-08-04 NOTE — Telephone Encounter (Signed)
Confirmed and screened for 08-06-19 ov. Has not been vaccinated.

## 2019-08-05 ENCOUNTER — Encounter: Payer: Self-pay | Admitting: Oncology

## 2019-08-05 ENCOUNTER — Inpatient Hospital Stay: Payer: Medicare Other | Attending: Oncology | Admitting: Oncology

## 2019-08-05 ENCOUNTER — Other Ambulatory Visit: Payer: Self-pay

## 2019-08-05 VITALS — BP 128/81 | HR 65 | Temp 96.9°F | Resp 16 | Wt 235.4 lb

## 2019-08-05 DIAGNOSIS — Z79899 Other long term (current) drug therapy: Secondary | ICD-10-CM | POA: Insufficient documentation

## 2019-08-05 DIAGNOSIS — C61 Malignant neoplasm of prostate: Secondary | ICD-10-CM | POA: Insufficient documentation

## 2019-08-05 DIAGNOSIS — D649 Anemia, unspecified: Secondary | ICD-10-CM | POA: Insufficient documentation

## 2019-08-05 NOTE — Progress Notes (Signed)
Patient denies new problems/concerns today.   °

## 2019-08-05 NOTE — Progress Notes (Signed)
Hematology/Oncology Consult note [facility] [phone] [phone]   Patient Care Team: Ronnell Freshwater, NP as PCP - General (Family Medicine)  REFERRING PROVIDER: Ronnell Freshwater, NP  CHIEF COMPLAINTS/REASON FOR VISIT:  Evaluation of history of prostate cancer, elevated PSA  HISTORY OF PRESENTING ILLNESS:   [name] is a  86 y.o.  male with PMH listed below was seen in consultation at the request of  Ronnell Freshwater, NP  for evaluation of history of prostate cancer with elevated PSA  Patient was accompanied by his wife.  They are both poor historian regarding patient's remote history of prostate cancer.  They recall that patient was diagnosed in 2000s, status post prostatectomy by [name] as well as prostate radiation.  He cannot recall any details.  He denies ever being on androgen deprivation therapy.  Patient recently presented to [facility] on 07/04/2019 for rectal bleeding. Patient has a history of diverticulosis, atrial fibrillation on Eliquis.  Colonoscopy showed 5 cm bleeding rectal ulcer with visible vessels.  Was treated with epinephrine injection into the ulcer and hemostatic spray.  Patient was transferred to [facility] due to hemodynamic instability and stayed until 07/08/2019 when he was discharged.  Patient was admitted to [facility].  Sigmoidoscopy on 614 revealed circumferential ulcer distal rectum with visible bleeding vessels treated with hot biopsy forceps. Pathology cannot rule out presence of ulcer in the setting of stercoral colitis.  No malignancy was found.  Patient received ferric gluconate 250 mg and was continued on oral iron supplementation at discharge.  Patient was hemodynamically stable and was discharged.  The rectal bleeding is considered to be secondary to radiation proctitis versus stercoral colitis. Eliquis was discontinued after weighing benefit of stroke prevention versus recurrent GI bleeding with Eliquis.  At  the time of discharge, a mutual decision was made with patient/family/medical team to not to restart Eliquis.  06/20/2019, CT chest without contrast showed incidental hypodense peripheral right liver 4.9 cm mass.  As well as 3.2 cm anterior left liver cyst. 07/05/2019 CT abdomen pelvis without contrast showed multiple small hypoattenuating lesions in the liver which are too small to accurately characterize.  There are hypoattenuating lesion in the represent cysts.  There is an exophytic lesion arising from the right hepatic lobe measuring 4.6 x 5.6 cm.  There are multiple enlarged right iliac and retroperitoneal lymph nodes 2.5 cm right iliac lymph nodes, 1.8 cm right iliac lymph node, 1.9 pericaval node new since prior study. 07/08/2019 MRI abdomen pelvis with and without contrast showed exophytic lesion arising from the right hepatic lobe measuring up to 4.6cm , demonstrating subtle heterogeneous enhancement and washout.  Suspicious for metastasis.  Additional there is superimposed hemorrhage within the lesion. Retroperitoneal and right iliac chain metastasis lymphadenopathy Marked mass-effect with probable small nonocclusive thrombus in the right external iliac vein in the area of right external iliac lymphadenopathy.  0.9 cm enhancing lesion apex of sacrum with associated restricted diffusion may reflect metastatic osseous lesions.   06/11/2019, PSA was 47.6.  INTERVAL HISTORY [name] is a 86 y.o. male who has above history reviewed by me today presents for follow up visit for prostate cancer Problems and complaints are listed below: Patient had AXUMIN PET scan done during the interval. 07/19/2019, PSA 49.7 Today he was accompanied by his wife.  He has no new complaints. Review of Systems  Constitutional: Negative for appetite change, chills, fatigue, fever and unexpected weight change.  HENT:   Negative for  hearing loss and voice change.   Eyes: Negative for eye problems and icterus.    Respiratory: Negative for chest tightness, cough and shortness of breath.   Cardiovascular: Negative for chest pain and leg swelling.  Gastrointestinal: Negative for abdominal distention and abdominal pain.  Endocrine: Negative for hot flashes.  Genitourinary: Negative for difficulty urinating, dysuria and frequency.   Musculoskeletal: Negative for arthralgias.  Skin: Negative for itching and rash.  Neurological: Negative for light-headedness and numbness.  Hematological: Negative for adenopathy. Does not bruise/bleed easily.  Psychiatric/Behavioral: Negative for confusion.    MEDICAL HISTORY:  Past Medical History:  Diagnosis Date  . Atrial fibrillation ([place])   . CHF (congestive heart failure) ([place])   . Diabetes ([place])   . Hearing loss   . High blood pressure   . History of bladder problems   . Kidney stones 11/22/2014    SURGICAL HISTORY: Past Surgical History:  Procedure Laterality Date  . CATARACT EXTRACTION    . COLONOSCOPY WITH PROPOFOL N/A 07/02/2019   Procedure: COLONOSCOPY WITH PROPOFOL;  Surgeon: Lin Landsman, MD;  Location[facility] ENDOSCOPY;  Service: Gastroenterology;  Laterality: N/A;  . PROSTATE CANCER    . PROSTATE SURGERY      SOCIAL HISTORY: Social History   Socioeconomic History  . Marital status: Married    Spouse name: Not on file  . Number of children: Not on file  . Years of education: Not on file  . Highest education level: Not on file  Occupational History  . Occupation: [employer]  Tobacco Use  . Smoking status: Former Smoker    Years: 16.00    Types: Cigarettes  . Smokeless tobacco: Never Used  Vaping Use  . Vaping Use: Never used  Substance and Sexual Activity  . Alcohol use: No  . Drug use: No  . Sexual activity: Not Currently  Other Topics Concern  . Not on file  Social History Narrative  . Not on file   Social Determinants of Health   Financial Resource Strain:   . Difficulty of Paying Living Expenses:   Food Insecurity:    . Worried About Charity fundraiser in the Last Year:   . Arboriculturist in the Last Year:   Transportation Needs:   . Film/video editor (Medical):   Marland Kitchen Lack of Transportation (Non-Medical):   Physical Activity:   . Days of Exercise per Week:   . Minutes of Exercise per Session:   Stress:   . Feeling of Stress :   Social Connections:   . Frequency of Communication with Friends and Family:   . Frequency of Social Gatherings with Friends and Family:   . Attends Religious Services:   . Active Member of Clubs or Organizations:   . Attends Archivist Meetings:   Marland Kitchen Marital Status:   Intimate Partner Violence:   . Fear of Current or Ex-Partner:   . Emotionally Abused:   Marland Kitchen Physically Abused:   . Sexually Abused:     FAMILY HISTORY: Family History  Problem Relation Age of Onset  . Colon cancer Mother   . Diabetes Other   . High blood pressure Other   . Prostate cancer Brother   . Diabetes Brother     ALLERGIES:  has No Known Allergies.  MEDICATIONS:  Current Outpatient Medications  Medication Sig Dispense Refill  . albuterol (VENTOLIN HFA) 108 (90 Base) MCG/ACT inhaler Inhale 2 puffs into the lungs every 4 (four) hours as needed for wheezing  or shortness of breath. 18 g 1  . atorvastatin (LIPITOR) 40 MG tablet Take 1 tablet (40 mg total) by mouth daily at 6 PM. 30 tablet 5  . ferrous sulfate 325 (65 FE) MG tablet Take 1 tablet by mouth 1 day or 1 dose.    Marland Kitchen glucose blood (ONETOUCH VERIO) test strip USE ONCE DAILY. DX E11.65. 100 each 3  . metoprolol tartrate (LOPRESSOR) 25 MG tablet Take by mouth.    . polyethylene glycol powder (GLYCOLAX/MIRALAX) 17 GM/SCOOP powder Take by mouth.    . sitaGLIPtin (JANUVIA) 25 MG tablet Take 1 tablet (25 mg total) by mouth daily. 30 tablet 3  . cephALEXin (KEFLEX) 500 MG capsule Take 1 capsule (500 mg total) by mouth 2 (two) times daily. (Patient not taking: Reported on 07/19/2019) 10 capsule 0  . docusate sodium (COLACE) 100 MG  capsule Take 1 capsule (100 mg total) by mouth 2 (two) times daily. (Patient not taking: Reported on 07/19/2019) 60 capsule 2  . pantoprazole (PROTONIX) 40 MG tablet TAKE 1 TABLET(40 MG) BY MOUTH DAILY (Patient not taking: Reported on 07/19/2019) 30 tablet 6  . torsemide (DEMADEX) 20 MG tablet Take 20 mg by mouth daily. (Patient not taking: Reported on 07/19/2019)     No current facility-administered medications for this visit.     PHYSICAL EXAMINATION: ECOG PERFORMANCE STATUS: 0 - Asymptomatic Vitals:   08/05/19 0914  BP: 128/81  Pulse: 65  Resp: 16  Temp: (!) 96.9 F (36.1 C)   Filed Weights   08/05/19 0914  Weight: 235 lb 6.4 oz (106.8 kg)    Physical Exam Constitutional:      General: He is not in acute distress. HENT:     Head: Normocephalic and atraumatic.  Eyes:     General: No scleral icterus. Cardiovascular:     Rate and Rhythm: Normal rate and regular rhythm.     Heart sounds: Normal heart sounds.  Pulmonary:     Effort: Pulmonary effort is normal. No respiratory distress.     Breath sounds: No wheezing.  Abdominal:     General: Bowel sounds are normal. There is no distension.     Palpations: Abdomen is soft.  Musculoskeletal:        General: No deformity. Normal range of motion.     Cervical back: Normal range of motion and neck supple.  Skin:    General: Skin is warm and dry.     Findings: No erythema or rash.  Neurological:     Mental Status: He is alert and oriented to person, place, and time. Mental status is at baseline.     Cranial Nerves: No cranial nerve deficit.     Coordination: Coordination normal.  Psychiatric:        Mood and Affect: Mood normal.     LABORATORY DATA:  I have reviewed the data as listed Lab Results  Component Value Date   WBC 4.8 07/19/2019   HGB 9.5 (L) 07/19/2019   HCT 31.1 (L) 07/19/2019   MCV 97.2 07/19/2019   PLT 352 07/19/2019   Recent Labs    06/30/19 1620 06/30/19 1620 07/01/19 0456 07/02/19 0446  07/03/19 0453 07/04/19 0320 07/19/19 1052  NA 140   < > 139   < > 139 136 142  K 4.5   < > 4.0   < > 4.1 4.7 5.0  CL 101   < > 103   < > 102 103 109  CO2 28   < >  26   < > 27 27 26   GLUCOSE 138*   < > 128*   < > 115* 162* 90  BUN 52*   < > 49*   < > 38* 34* 31*  CREATININE 2.75*   < > 2.45*   < > 2.33* 2.44* 2.01*  CALCIUM 9.0   < > 9.0   < > 8.6* 8.4* 9.0  GFRNONAA 20*   < > 23*   < > 25* 23* 30*  GFRAA 23*   < > 27*   < > 29* 27* 34*  PROT 7.9  --  7.4  --   --   --  7.4  ALBUMIN 3.5  --  3.3*  --   --   --  3.3*  AST 24  --  21  --   --   --  16  ALT 35  --  31  --   --   --  21  ALKPHOS 112  --  105  --   --   --  115  BILITOT 0.9  --  0.5  --   --   --  0.5   < > = values in this interval not displayed.   Iron/TIBC/Ferritin/ %Sat    Component Value Date/Time   IRON 59 07/19/2019 1052   TIBC 294 07/19/2019 1052   FERRITIN 228 07/19/2019 1052   IRONPCTSAT 20 07/19/2019 1052      RADIOGRAPHIC STUDIES: I have personally reviewed the radiological images as listed and agreed with the findings in the report. NM PET (AXUMIN) SKULL BASE TO MID THIGH  Result Date: 08/04/2019 CLINICAL DATA:  Subsequent treatment strategy for recurrent prostate cancer. Initial diagnosis 2013. Prior prostatectomy is elevated PSA EXAM: NUCLEAR MEDICINE PET SKULL BASE TO THIGH TECHNIQUE: 10.6 mCi F-18 Fluciclovine was injected intravenously. Full-ring PET imaging was performed from the skull base to thigh after the radiotracer. CT data was obtained and used for attenuation correction and anatomic localization. COMPARISON:  None. FINDINGS: NECK No radiotracer activity in neck lymph nodes. Incidental CT finding: None CHEST No radiotracer accumulation within mediastinal or hilar lymph nodes. No suspicious pulmonary nodules on the CT scan. Incidental CT finding: None ABDOMEN/PELVIS Prostate: No focal activity in the prostate bed. Lymph nodes: Intense radiotracer activity within a RIGHT external iliac lymph node  measuring 1.5 cm (image 221/CT series 3) with SUV max equal 19.2. Enlarged higher RIGHT common iliac node measures 1.7 cm with SUV max equal 11.8. Higher LEFT periaortic lymph node 0.7 cm SUV max equal 9.5. This is the highest radiotracer avid lymph node and is below the renal veins. Liver: No evidence of liver metastasis Incidental CT finding: None SKELETON No focal  activity to suggest skeletal metastasis. IMPRESSION: 1. Intensely radiotracer avid RIGHT iliac adenopathy and periaortic retroperitoneal adenopathy consistent with prostate cancer nodal metastasis. 2. No evidence of visceral metastasis or skeletal metastasis. Electronically Signed   By: Suzy Bouchard M.D.   On: 08/04/2019 08:34      ASSESSMENT & PLAN:  1. Prostate cancer ([place])   2. Normocytic anemia    Axumin PET was reviewed and discussed with patient. Patient has intensive radiotracer avid right iliac adenopathy in the periaortic retroperitoneal adenopathy consistent with prostate cancer nodal metastasis. No evidence of visceral disease or skeletal metastasis. PSA is elevated 49. These are compatible with nodal prostate cancer. Extensive medical records review of our current EMR and [facility] records, I do not see original prostate cancer pathology.  We  will look into old records. Patient is unable to recall details of his prostatectomy and adjuvant radiation. If no previous records can be discovered, may need to proceed with biopsy for establishing tissue diagnosis.  Treatment wise, discussed with patient about ADT.  Rationale of androgen deprivation therapy and side effects were discussed with patient.  And he agrees.  I will start him on Firmagon treatments. I will also refer patient to establish care with radiation oncology to determine if he is a candidate for additional radiation.  #Normocytic anemia Ferritin 228.  Likely anemia secondary to chronic kidney disease.   Erythropoietin replacement therapy is contraindicated due  to active cancer.  Continue close monitor.  Check vitamin B12 and folate level at the next visit.   Orders Placed This Encounter  Procedures  . PSA    Standing Status:   Future    Standing Expiration Date:   08/04/2020  . Testosterone    Standing Status:   Future    Standing Expiration Date:   08/04/2020  . Ambulatory referral to Radiation Oncology    Referral Priority:   Routine    Referral Type:   Consultation    Referral Reason:   Specialty Services Required    Referred to Provider:   Noreene Filbert, MD    Requested Specialty:   Radiation Oncology    Number of Visits Requested:   1    All questions were answered. The patient knows to call the clinic with any problems questions or concerns.  cc Ronnell Freshwater, NP    Return of visit: After PET scan Thank you for this kind referral and the opportunity to participate in the care of this patient. A copy of today's note is routed to referring provider    Earlie Server, MD, PhD Hematology Oncology [facility] prostate Pager- [phone] 08/05/2019

## 2019-08-06 ENCOUNTER — Ambulatory Visit (INDEPENDENT_AMBULATORY_CARE_PROVIDER_SITE_OTHER): Payer: Medicare Other | Admitting: Nurse Practitioner

## 2019-08-06 ENCOUNTER — Encounter: Payer: Self-pay | Admitting: Nurse Practitioner

## 2019-08-06 VITALS — BP 96/63 | HR 65 | Temp 97.3°F | Resp 16 | Ht 72.0 in | Wt 234.4 lb

## 2019-08-06 DIAGNOSIS — E1122 Type 2 diabetes mellitus with diabetic chronic kidney disease: Secondary | ICD-10-CM | POA: Diagnosis not present

## 2019-08-06 DIAGNOSIS — I10 Essential (primary) hypertension: Secondary | ICD-10-CM

## 2019-08-06 DIAGNOSIS — K5909 Other constipation: Secondary | ICD-10-CM | POA: Diagnosis not present

## 2019-08-06 DIAGNOSIS — N183 Chronic kidney disease, stage 3 unspecified: Secondary | ICD-10-CM | POA: Diagnosis not present

## 2019-08-06 NOTE — Progress Notes (Signed)
[facility] [place], [place] [address]  Internal MEDICINE  Office Visit Note  Patient Name: [name]  [id number]  [id number]  Date of Service: 08/29/2019   Pt is here for a sick visit.   Chief Complaint  Patient presents with  . Follow-up  . Diabetes  . Hypertension     The patient is here for acute visit. He was started on Januvia 25mg  daily at his last visit. He states that he has tolerated this medication well, however, it is very expensive. Labs done prior to this visit and after starting Januvia show normal glucose level at 90. His BUN improved from 34 to 31, creatinine improved from 2044 to 2.01. his calcium and potassium levels were normal. The patient continues to have problems with constipation. He states that he is feeling well, overall. He has returned to work.       Current Medication:  No facility-administered encounter medications on file as of 08/06/2019.   Outpatient Encounter Medications as of 08/06/2019  Medication Sig  . albuterol (VENTOLIN HFA) 108 (90 Base) MCG/ACT inhaler Inhale 2 puffs into the lungs every 4 (four) hours as needed for wheezing or shortness of breath.  Marland Kitchen atorvastatin (LIPITOR) 40 MG tablet Take 1 tablet (40 mg total) by mouth daily at 6 PM.  . ferrous sulfate 325 (65 FE) MG tablet Take 325 mg by mouth daily.   Marland Kitchen glucose blood (ONETOUCH VERIO) test strip USE ONCE DAILY. DX E11.65.  . [EXPIRED] metoprolol tartrate (LOPRESSOR) 25 MG tablet Take by mouth.  . pantoprazole (PROTONIX) 40 MG tablet TAKE 1 TABLET(40 MG) BY MOUTH DAILY (Patient not taking: Reported on 08/28/2019)  . [EXPIRED] polyethylene glycol powder (GLYCOLAX/MIRALAX) 17 GM/SCOOP powder Take by mouth.  . sitaGLIPtin (JANUVIA) 25 MG tablet Take 1 tablet (25 mg total) by mouth daily.  . [DISCONTINUED] cephALEXin (KEFLEX) 500 MG capsule Take 1 capsule (500 mg total) by mouth 2 (two) times daily. (Patient not taking: Reported on 08/16/2019)  .  [DISCONTINUED] docusate sodium (COLACE) 100 MG capsule Take 1 capsule (100 mg total) by mouth 2 (two) times daily.  . [DISCONTINUED] torsemide (DEMADEX) 20 MG tablet Take 20 mg by mouth daily.       Medical History: Past Medical History:  Diagnosis Date  . Atrial fibrillation ([place])   . CHF (congestive heart failure) ([place])   . Diabetes ([place])   . Hearing loss   . High blood pressure   . History of bladder problems   . Kidney stones 11/22/2014  . Prostate cancer ([place])      Today's Vitals   08/06/19 1111  BP: 96/63  Pulse: 65  Resp: 16  Temp: (!) 97.3 F (36.3 C)  SpO2: 97%  Weight: 234 lb 6.4 oz (106.3 kg)  Height: 6' (1.829 m)   Body mass index is 31.79 kg/m.  Review of Systems  Constitutional: Positive for fatigue. Negative for activity change, chills and unexpected weight change.  HENT: Negative for congestion, postnasal drip, rhinorrhea, sneezing and sore throat.   Respiratory: Negative for cough, chest tightness, shortness of breath and wheezing.   Cardiovascular: Negative for chest pain and palpitations.  Gastrointestinal: Positive for constipation. Negative for abdominal pain, diarrhea, nausea and vomiting.  Endocrine: Negative for cold intolerance, heat intolerance, polydipsia and polyuria.       Improved blood sugars .  Musculoskeletal: Negative for arthralgias, back pain, joint swelling and neck pain.  Skin: Negative for rash.  Allergic/Immunologic: Negative for environmental allergies.  Neurological: Negative for dizziness, tremors, numbness and headaches.  Hematological: Negative for adenopathy. Does not bruise/bleed easily.  Psychiatric/Behavioral: Negative for behavioral problems (Depression), sleep disturbance and suicidal ideas. The patient is not nervous/anxious.     Physical Exam Vitals and nursing note reviewed.  Constitutional:      General: He is not in acute distress.    Appearance: Normal appearance. He is well-developed. He is not diaphoretic.   HENT:     Head: Normocephalic and atraumatic.     Mouth/Throat:     Pharynx: No oropharyngeal exudate.  Eyes:     Pupils: Pupils are equal, round, and reactive to light.  Neck:     Thyroid: No thyromegaly.     Vascular: No carotid bruit or JVD.     Trachea: No tracheal deviation.  Cardiovascular:     Rate and Rhythm: Normal rate and regular rhythm.     Heart sounds: Normal heart sounds. No murmur heard.  No friction rub. No gallop.   Pulmonary:     Effort: Pulmonary effort is normal. No respiratory distress.     Breath sounds: Normal breath sounds. No wheezing or rales.  Chest:     Chest wall: No tenderness.  Abdominal:     General: Bowel sounds are normal.     Palpations: Abdomen is soft.     Tenderness: There is no abdominal tenderness.  Genitourinary:    Comments: U/a showing trace of blood only.  Musculoskeletal:        General: Normal range of motion.     Cervical back: Normal range of motion and neck supple.  Lymphadenopathy:     Cervical: No cervical adenopathy.  Skin:    General: Skin is warm and dry.  Neurological:     Mental Status: He is alert and oriented to person, place, and time. Mental status is at baseline.     Cranial Nerves: No cranial nerve deficit.  Psychiatric:        Mood and Affect: Mood normal.        Behavior: Behavior normal.        Thought Content: Thought content normal.        Judgment: Judgment normal.    Assessment/Plan:  1. Type 2 diabetes mellitus with stage 3 chronic kidney disease, without long-term current use of insulin, unspecified whether stage 3a or 3b CKD ([place]) Improving since starting on Januvia 25mg  tablets daily. After discussing different options for blood sugar control, patient chooses to continue with Januvia 25mg  daily. Samples were provided today. Patient and family to monitor his blood sugars closely.   2. Essential hypertension BP a little low today. Patient does see cardiology. No changes made to meds today.   3.  Other constipation Slowly improving. Continue to monitor.    General Counseling: lennyn bellanca understanding of the findings of todays visit and agrees with plan of treatment. I have discussed any further diagnostic evaluation that may be needed or ordered today. We also reviewed his medications today. he has been encouraged to call the office with any questions or concerns that should arise related to todays visit.    Counseling:   This patient was seen by Leretha Pol FNP Collaboration with Dr Lavera Guise as a part of collaborative care agreement  Time spent: 30 Minutes

## 2019-08-07 ENCOUNTER — Other Ambulatory Visit: Payer: Self-pay | Admitting: Oncology

## 2019-08-09 ENCOUNTER — Other Ambulatory Visit: Payer: Self-pay | Admitting: Oncology

## 2019-08-09 ENCOUNTER — Telehealth: Payer: Self-pay

## 2019-08-09 DIAGNOSIS — C61 Malignant neoplasm of prostate: Secondary | ICD-10-CM

## 2019-08-09 NOTE — Telephone Encounter (Signed)
-----   Message from Earlie Server, MD sent at 08/09/2019  3:45 PM EDT ----- Please let him know that since we are not able to find his original report of prostate cancer, I will need to hold of firmagon injection and consider doing a biopsy. His case will be discussed on tumor board this week, and he will be contacted for biopsy  If he is able to provide additional information of where the biopsy is being done, that may save him a biopsy.   Cancel firmagon please

## 2019-08-09 NOTE — Telephone Encounter (Signed)
Should we cancel appts in August or leave as is for now?

## 2019-08-09 NOTE — Telephone Encounter (Signed)
Done  cancel Firmagon on 7/22. Do I need to cx the Inj that's 09/09/19 also? Or just edit that one as NEW?

## 2019-08-09 NOTE — Telephone Encounter (Signed)
Patient was at work, but I spoke to wife, [name] and notified her of plan and cancellation of [facility]. Mrs. Lysle Pearl states that first biopsy was done at [facility].

## 2019-08-11 ENCOUNTER — Ambulatory Visit: Payer: Medicare Other

## 2019-08-12 ENCOUNTER — Telehealth: Payer: Self-pay | Admitting: *Deleted

## 2019-08-12 ENCOUNTER — Inpatient Hospital Stay: Payer: Medicare Other

## 2019-08-12 ENCOUNTER — Ambulatory Visit
Admission: RE | Admit: 2019-08-12 | Discharge: 2019-08-12 | Disposition: A | Discharge status: Home / Self Care | Payer: Self-pay | Source: Ambulatory Visit | Attending: Oncology | Admitting: Oncology

## 2019-08-12 ENCOUNTER — Ambulatory Visit: Admission: RE | Admit: 2019-08-12 | Payer: Medicare Other | Source: Ambulatory Visit | Admitting: Radiation Oncology

## 2019-08-12 ENCOUNTER — Telehealth: Payer: Self-pay

## 2019-08-12 ENCOUNTER — Other Ambulatory Visit: Payer: Medicare Other

## 2019-08-12 DIAGNOSIS — C61 Malignant neoplasm of prostate: Secondary | ICD-10-CM

## 2019-08-12 NOTE — Telephone Encounter (Signed)
[name] has been notified.

## 2019-08-12 NOTE — Telephone Encounter (Signed)
error 

## 2019-08-12 NOTE — Telephone Encounter (Addendum)
Please schedule pt to see MD on Monday & notify pt's wife. Keep other appts as scheduled. Thanks

## 2019-08-12 NOTE — Telephone Encounter (Signed)
See MD Only on 08/16/19 per Dr. Tasia Catchings Appt was sched as requested. A detailed message was left on pts wife "[name]" vmail making her aware of the scheduled appt date and time.

## 2019-08-13 ENCOUNTER — Telehealth: Payer: Self-pay

## 2019-08-13 NOTE — Telephone Encounter (Signed)
Pt is taking Atorvastatin and keeping up with refills

## 2019-08-13 NOTE — Progress Notes (Signed)
Tumor Board Documentation  ALVARO AUNGST was presented by Dr Tasia Catchings at our Tumor Board on 08/12/2019, which included representatives from medical oncology, radiation oncology, internal medicine, navigation, pathology, radiology, surgical, pharmacy, genetics, palliative care, pulmonology.  [name] currently presents as a new patient, for discussion with history of the following treatments: surgical intervention(s), active survellience, adjuvant radiation.  Additionally, we reviewed previous medical and familial history, history of present illness, and recent lab results along with all available histopathologic and imaging studies. The tumor board considered available treatment options and made the following recommendations: Biopsy (CT Guided)    The following procedures/referrals were also placed: No orders of the defined types were placed in this encounter.   Clinical Trial Status: not discussed   Staging used: AJCC Stage Group  AJCC Staging:   N: 1 M: 1a Group: Stage IV B Prostate cancer   National site-specific guidelines NCCN were discussed with respect to the case.  Tumor board is a meeting of clinicians from various specialty areas who evaluate and discuss patients for whom a multidisciplinary approach is being considered. Final determinations in the plan of care are those of the provider(s). The responsibility for follow up of recommendations given during tumor board is that of the provider.   Today's extended care, comprehensive team conference, Parish was not present for the discussion and was not examined.   Multidisciplinary Tumor Board is a multidisciplinary case peer review process.  Decisions discussed in the Multidisciplinary Tumor Board reflect the opinions of the specialists present at the conference without having examined the patient.  Ultimately, treatment and diagnostic decisions rest with the primary provider(s) and the patient.

## 2019-08-16 ENCOUNTER — Inpatient Hospital Stay (HOSPITAL_BASED_OUTPATIENT_CLINIC_OR_DEPARTMENT_OTHER): Payer: Medicare Other | Admitting: Oncology

## 2019-08-16 ENCOUNTER — Encounter: Payer: Self-pay | Admitting: Oncology

## 2019-08-16 ENCOUNTER — Other Ambulatory Visit: Payer: Self-pay

## 2019-08-16 VITALS — BP 104/63 | HR 80 | Temp 95.9°F | Resp 18 | Wt 242.3 lb

## 2019-08-16 DIAGNOSIS — C61 Malignant neoplasm of prostate: Secondary | ICD-10-CM | POA: Diagnosis not present

## 2019-08-16 DIAGNOSIS — Z79899 Other long term (current) drug therapy: Secondary | ICD-10-CM | POA: Diagnosis not present

## 2019-08-16 DIAGNOSIS — Z8546 Personal history of malignant neoplasm of prostate: Secondary | ICD-10-CM

## 2019-08-16 DIAGNOSIS — D649 Anemia, unspecified: Secondary | ICD-10-CM

## 2019-08-16 NOTE — Progress Notes (Signed)
Hematology/Oncology  Follow up note [facility] Telephone:([phone]) [phone] Fax:(336) [phone]   Patient Care Team: Ronnell Freshwater, NP as PCP - General (Family Medicine)  REFERRING PROVIDER: Ronnell Freshwater, NP  CHIEF COMPLAINTS/REASON FOR VISIT:  Evaluation of history of prostate cancer, elevated PSA  HISTORY OF PRESENTING ILLNESS:   [name] is a  86 y.o.  male with PMH listed below was seen in consultation at the request of  Ronnell Freshwater, NP  for evaluation of history of prostate cancer with elevated PSA  Patient was accompanied by his wife.  They are both poor historian regarding patient's remote history of prostate cancer.  They recall that patient was diagnosed in 2000s, status post prostatectomy by [name] as well as prostate radiation.  He cannot recall any details.  He denies ever being on androgen deprivation therapy.  Patient recently presented to [facility] on 07/04/2019 for rectal bleeding. Patient has a history of diverticulosis, atrial fibrillation on Eliquis.  Colonoscopy showed 5 cm bleeding rectal ulcer with visible vessels.  Was treated with epinephrine injection into the ulcer and hemostatic spray.  Patient was transferred to [facility] due to hemodynamic instability and stayed until 07/08/2019 when he was discharged.  Patient was admitted to [facility] MICU.  Sigmoidoscopy on 614 revealed circumferential ulcer distal rectum with visible bleeding vessels treated with hot biopsy forceps. Pathology cannot rule out presence of ulcer in the setting of stercoral colitis.  No malignancy was found.  Patient received ferric gluconate 250 mg and was continued on oral iron supplementation at discharge.  Patient was hemodynamically stable and was discharged.  The rectal bleeding is considered to be secondary to radiation proctitis versus stercoral colitis. Eliquis was discontinued after weighing benefit of stroke prevention versus recurrent GI bleeding with Eliquis.   At the time of discharge, a mutual decision was made with patient/family/medical team to not to restart Eliquis.  06/20/2019, CT chest without contrast showed incidental hypodense peripheral right liver 4.9 cm mass.  As well as 3.2 cm anterior left liver cyst. 07/05/2019 CT abdomen pelvis without contrast showed multiple small hypoattenuating lesions in the liver which are too small to accurately characterize.  There are hypoattenuating lesion in the represent cysts.  There is an exophytic lesion arising from the right hepatic lobe measuring 4.6 x 5.6 cm.  There are multiple enlarged right iliac and retroperitoneal lymph nodes 2.5 cm right iliac lymph nodes, 1.8 cm right iliac lymph node, 1.9 pericaval node new since prior study. 07/08/2019 MRI abdomen pelvis with and without contrast showed exophytic lesion arising from the right hepatic lobe measuring up to 4.6cm , demonstrating subtle heterogeneous enhancement and washout.  Suspicious for metastasis.  Additional there is superimposed hemorrhage within the lesion. Retroperitoneal and right iliac chain metastasis lymphadenopathy Marked mass-effect with probable small nonocclusive thrombus in the right external iliac vein in the area of right external iliac lymphadenopathy.  0.9 cm enhancing lesion apex of sacrum with associated restricted diffusion may reflect metastatic osseous lesions.   06/11/2019, PSA was 47.6.  INTERVAL HISTORY [name] is a 86 y.o. male who has above history reviewed by me today presents for follow up visit for prostate cancer Problems and complaints are listed below: Patient denies any complaints today.  He continues to work part-time.  He was Companied by his wife to discuss next steps.   Review of Systems  Constitutional: Negative for appetite change, chills, fatigue, fever and unexpected weight change.  HENT:   Negative  for hearing loss and voice change.   Eyes: Negative for eye problems and icterus.    Respiratory: Negative for chest tightness, cough and shortness of breath.   Cardiovascular: Negative for chest pain and leg swelling.  Gastrointestinal: Negative for abdominal distention and abdominal pain.  Endocrine: Negative for hot flashes.  Genitourinary: Negative for difficulty urinating, dysuria and frequency.   Musculoskeletal: Negative for arthralgias.  Skin: Negative for itching and rash.  Neurological: Negative for light-headedness and numbness.  Hematological: Negative for adenopathy. Does not bruise/bleed easily.  Psychiatric/Behavioral: Negative for confusion.    MEDICAL HISTORY:  Past Medical History:  Diagnosis Date  . Atrial fibrillation ([place])   . CHF (congestive heart failure) ([place])   . Diabetes ([place])   . Hearing loss   . High blood pressure   . History of bladder problems   . Kidney stones 11/22/2014    SURGICAL HISTORY: Past Surgical History:  Procedure Laterality Date  . CATARACT EXTRACTION    . COLONOSCOPY WITH PROPOFOL N/A 07/02/2019   Procedure: COLONOSCOPY WITH PROPOFOL;  Surgeon: Lin Landsman, MD;  Location: [facility] ENDOSCOPY;  Service: Gastroenterology;  Laterality: N/A;  . PROSTATE CANCER    . PROSTATE SURGERY      SOCIAL HISTORY: Social History   Socioeconomic History  . Marital status: Married    Spouse name: Not on file  . Number of children: Not on file  . Years of education: Not on file  . Highest education level: Not on file  Occupational History  . Occupation: [employer]  Tobacco Use  . Smoking status: Former Smoker    Years: 16.00    Types: Cigarettes  . Smokeless tobacco: Never Used  Vaping Use  . Vaping Use: Never used  Substance and Sexual Activity  . Alcohol use: No  . Drug use: No  . Sexual activity: Not Currently  Other Topics Concern  . Not on file  Social History Narrative  . Not on file   Social Determinants of Health   Financial Resource Strain:   . Difficulty of Paying Living Expenses:   Food Insecurity:    . Worried About Charity fundraiser in the Last Year:   . Arboriculturist in the Last Year:   Transportation Needs:   . Film/video editor (Medical):   Marland Kitchen Lack of Transportation (Non-Medical):   Physical Activity:   . Days of Exercise per Week:   . Minutes of Exercise per Session:   Stress:   . Feeling of Stress :   Social Connections:   . Frequency of Communication with Friends and Family:   . Frequency of Social Gatherings with Friends and Family:   . Attends Religious Services:   . Active Member of Clubs or Organizations:   . Attends Archivist Meetings:   Marland Kitchen Marital Status:   Intimate Partner Violence:   . Fear of Current or Ex-Partner:   . Emotionally Abused:   Marland Kitchen Physically Abused:   . Sexually Abused:     FAMILY HISTORY: Family History  Problem Relation Age of Onset  . Colon cancer Mother   . Diabetes Other   . High blood pressure Other   . Prostate cancer Brother   . Diabetes Brother     ALLERGIES:  has No Known Allergies.  MEDICATIONS:  Current Outpatient Medications  Medication Sig Dispense Refill  . albuterol (VENTOLIN HFA) 108 (90 Base) MCG/ACT inhaler Inhale 2 puffs into the lungs every 4 (four) hours as needed for  wheezing or shortness of breath. 18 g 1  . atorvastatin (LIPITOR) 40 MG tablet Take 1 tablet (40 mg total) by mouth daily at 6 PM. 30 tablet 5  . cephALEXin (KEFLEX) 500 MG capsule Take 1 capsule (500 mg total) by mouth 2 (two) times daily. 10 capsule 0  . docusate sodium (COLACE) 100 MG capsule Take 1 capsule (100 mg total) by mouth 2 (two) times daily. 60 capsule 2  . ferrous sulfate 325 (65 FE) MG tablet Take 1 tablet by mouth 1 day or 1 dose.    Marland Kitchen glucose blood (ONETOUCH VERIO) test strip USE ONCE DAILY. DX E11.65. 100 each 3  . pantoprazole (PROTONIX) 40 MG tablet TAKE 1 TABLET(40 MG) BY MOUTH DAILY 30 tablet 6  . sitaGLIPtin (JANUVIA) 25 MG tablet Take 1 tablet (25 mg total) by mouth daily. 30 tablet 3  . torsemide (DEMADEX) 20  MG tablet Take 20 mg by mouth daily.      No current facility-administered medications for this visit.     PHYSICAL EXAMINATION: ECOG PERFORMANCE STATUS: 0 - Asymptomatic Vitals:   08/16/19 0901  BP: (!) 104/63  Pulse: 80  Resp: 18  Temp: (!) 95.9 F (35.5 C)   Filed Weights   08/16/19 0901  Weight: (!) 242 lb 4.8 oz (109.9 kg)    Physical Exam Constitutional:      General: He is not in acute distress. HENT:     Head: Normocephalic and atraumatic.  Eyes:     General: No scleral icterus. Cardiovascular:     Rate and Rhythm: Normal rate and regular rhythm.     Heart sounds: Normal heart sounds.  Pulmonary:     Effort: Pulmonary effort is normal. No respiratory distress.     Breath sounds: No wheezing.  Abdominal:     General: Bowel sounds are normal. There is no distension.     Palpations: Abdomen is soft.  Musculoskeletal:        General: No deformity. Normal range of motion.     Cervical back: Normal range of motion and neck supple.  Skin:    General: Skin is warm and dry.     Findings: No erythema or rash.  Neurological:     Mental Status: He is alert and oriented to person, place, and time. Mental status is at baseline.     Cranial Nerves: No cranial nerve deficit.     Coordination: Coordination normal.  Psychiatric:        Mood and Affect: Mood normal.     LABORATORY DATA:  I have reviewed the data as listed Lab Results  Component Value Date   WBC 4.8 07/19/2019   HGB 9.5 (L) 07/19/2019   HCT 31.1 (L) 07/19/2019   MCV 97.2 07/19/2019   PLT 352 07/19/2019   Recent Labs    06/30/19 1620 06/30/19 1620 07/01/19 0456 07/02/19 0446 07/03/19 0453 07/04/19 0320 07/19/19 1052  NA 140   < > 139   < > 139 136 142  K 4.5   < > 4.0   < > 4.1 4.7 5.0  CL 101   < > 103   < > 102 103 109  CO2 28   < > 26   < > 27 27 26   GLUCOSE 138*   < > 128*   < > 115* 162* 90  BUN 52*   < > 49*   < > 38* 34* 31*  CREATININE 2.75*   < > 2.45*   < >  2.33* 2.44* 2.01*    CALCIUM 9.0   < > 9.0   < > 8.6* 8.4* 9.0  GFRNONAA 20*   < > 23*   < > 25* 23* 30*  GFRAA 23*   < > 27*   < > 29* 27* 34*  PROT 7.9  --  7.4  --   --   --  7.4  ALBUMIN 3.5  --  3.3*  --   --   --  3.3*  AST 24  --  21  --   --   --  16  ALT 35  --  31  --   --   --  21  ALKPHOS 112  --  105  --   --   --  115  BILITOT 0.9  --  0.5  --   --   --  0.5   < > = values in this interval not displayed.   Iron/TIBC/Ferritin/ %Sat    Component Value Date/Time   IRON 59 07/19/2019 1052   TIBC 294 07/19/2019 1052   FERRITIN 228 07/19/2019 1052   IRONPCTSAT 20 07/19/2019 1052      RADIOGRAPHIC STUDIES: I have personally reviewed the radiological images as listed and agreed with the findings in the report. NM PET (AXUMIN) SKULL BASE TO MID THIGH  Result Date: 08/04/2019 CLINICAL DATA:  Subsequent treatment strategy for recurrent prostate cancer. Initial diagnosis 2013. Prior prostatectomy is elevated PSA EXAM: NUCLEAR MEDICINE PET SKULL BASE TO THIGH TECHNIQUE: 10.6 mCi F-18 Fluciclovine was injected intravenously. Full-ring PET imaging was performed from the skull base to thigh after the radiotracer. CT data was obtained and used for attenuation correction and anatomic localization. COMPARISON:  None. FINDINGS: NECK No radiotracer activity in neck lymph nodes. Incidental CT finding: None CHEST No radiotracer accumulation within mediastinal or hilar lymph nodes. No suspicious pulmonary nodules on the CT scan. Incidental CT finding: None ABDOMEN/PELVIS Prostate: No focal activity in the prostate bed. Lymph nodes: Intense radiotracer activity within a RIGHT external iliac lymph node measuring 1.5 cm (image 221/CT series 3) with SUV max equal 19.2. Enlarged higher RIGHT common iliac node measures 1.7 cm with SUV max equal 11.8. Higher LEFT periaortic lymph node 0.7 cm SUV max equal 9.5. This is the highest radiotracer avid lymph node and is below the renal veins. Liver: No evidence of liver metastasis  Incidental CT finding: None SKELETON No focal  activity to suggest skeletal metastasis. IMPRESSION: 1. Intensely radiotracer avid RIGHT iliac adenopathy and periaortic retroperitoneal adenopathy consistent with prostate cancer nodal metastasis. 2. No evidence of visceral metastasis or skeletal metastasis. Electronically Signed   By: Suzy Bouchard M.D.   On: 08/04/2019 08:34      ASSESSMENT & PLAN:  1. History of prostate cancer   2. Normocytic anemia    Axumin PET was reviewed and discussed with patient. Patient has intensive radiotracer avid right iliac adenopathy in the periaortic retroperitoneal adenopathy consistent with prostate cancer nodal metastasis. No evidence of visceral disease or skeletal metastasis. PSA is elevated 49. These findings are compatible with recurrence of prostate cancer. Patient has a remote history of prostatectomy and pelvic radiation in 2000's. Extensive medical records review of our current EMR, previous EMR, [facility] EMR records I was not able to get original prostate cancer pathology. Also discussed with pathology Dr. Reuel Derby who also cannot find any history of pathology results. I discussed with patient about the rationale of repeat biopsy.  CT-guided biopsy of his pelvic  lymph nodes. Repeat biopsy will confirm the diagnosis of prostate cancer recurrence, also may provide tissue for additional molecular testing. Patient is agreeable .  We faxed request to our medical records to see if there is any paper records.  Awaiting response from medical records.  #Anemia is likely secondary to chronic kidney disease.  No orders of the defined types were placed in this encounter. Follow-up to be determined. All questions were answered. The patient knows to call the clinic with any problems questions or concerns.  cc Ronnell Freshwater, NP    Return of visit: To be determined    Earlie Server, MD, PhD Hematology Oncology [facility] at [facility]  prostate Pager- [phone] 08/16/2019

## 2019-08-16 NOTE — Progress Notes (Signed)
Patient denies new problems/concerns today.   °

## 2019-08-18 ENCOUNTER — Telehealth: Payer: Self-pay

## 2019-08-18 ENCOUNTER — Other Ambulatory Visit: Payer: Self-pay | Admitting: Oncology

## 2019-08-18 NOTE — Telephone Encounter (Signed)
Done.. Called and spoke with pt wife and made her aware of his sched appts *NEW* Firmagon (loading dose) ASAP on 08/19/19 And RTC in 4 weeks  MD/ [facility] Koller Inj

## 2019-08-18 NOTE — Telephone Encounter (Signed)
Received pathology report records from 1999 (scanned in media). MD reviewed records and communicated plan with patient's wife.   Please cancel appts on 8/17 & 8/19 & Please schedule:   [facility] Koller *new* (loading dose) ASAP lab (early morning) /MD/ firmagon 4 weeks from first dose with labs 1 day prior   Please notify pt/ wife of new appts. Thanks

## 2019-08-19 ENCOUNTER — Other Ambulatory Visit: Payer: Self-pay

## 2019-08-19 ENCOUNTER — Inpatient Hospital Stay: Payer: Medicare Other

## 2019-08-19 DIAGNOSIS — Z79899 Other long term (current) drug therapy: Secondary | ICD-10-CM | POA: Diagnosis not present

## 2019-08-19 DIAGNOSIS — C61 Malignant neoplasm of prostate: Secondary | ICD-10-CM | POA: Diagnosis not present

## 2019-08-19 DIAGNOSIS — D649 Anemia, unspecified: Secondary | ICD-10-CM | POA: Diagnosis not present

## 2019-08-19 MED ORDER — DEGARELIX ACETATE(240 MG DOSE) 120 MG/VIAL ~~LOC~~ SOLR
240.0000 mg | Freq: Once | SUBCUTANEOUS | Status: AC
  Administered 2019-08-19: 240 mg via SUBCUTANEOUS
  Filled 2019-08-19: qty 6

## 2019-08-20 ENCOUNTER — Ambulatory Visit: Payer: Medicare Other

## 2019-08-27 ENCOUNTER — Inpatient Hospital Stay (HOSPITAL_COMMUNITY)
Admission: EM | Admit: 2019-08-27 | Discharge: 2019-08-31 | DRG: 024 | Disposition: A | Discharge status: Home Health | Payer: Medicare Other | Source: Other Acute Inpatient Hospital | Attending: Neurology | Admitting: Neurology

## 2019-08-27 ENCOUNTER — Emergency Department: Payer: Medicare Other

## 2019-08-27 ENCOUNTER — Inpatient Hospital Stay: Admit: 2019-08-27 | Payer: Medicare Other | Admitting: Interventional Radiology

## 2019-08-27 ENCOUNTER — Emergency Department (HOSPITAL_COMMUNITY): Payer: Medicare Other

## 2019-08-27 ENCOUNTER — Other Ambulatory Visit (HOSPITAL_COMMUNITY): Payer: Self-pay | Admitting: Neuroradiology

## 2019-08-27 ENCOUNTER — Emergency Department
Admission: EM | Admit: 2019-08-27 | Discharge: 2019-08-27 | Disposition: A | Discharge status: Other Acute Inpatient Hospital | Payer: Medicare Other | Attending: Emergency Medicine | Admitting: Emergency Medicine

## 2019-08-27 ENCOUNTER — Encounter: Payer: Self-pay | Admitting: Emergency Medicine

## 2019-08-27 ENCOUNTER — Inpatient Hospital Stay (HOSPITAL_COMMUNITY): Payer: Medicare Other

## 2019-08-27 ENCOUNTER — Encounter (HOSPITAL_COMMUNITY): Payer: Self-pay | Admitting: Neurology

## 2019-08-27 ENCOUNTER — Other Ambulatory Visit: Payer: Self-pay

## 2019-08-27 ENCOUNTER — Inpatient Hospital Stay (HOSPITAL_COMMUNITY): Payer: Medicare Other | Admitting: Anesthesiology

## 2019-08-27 ENCOUNTER — Encounter (HOSPITAL_COMMUNITY)
Admission: EM | Disposition: A | Discharge status: Home Health | Payer: Self-pay | Source: Other Acute Inpatient Hospital | Attending: Neurology

## 2019-08-27 DIAGNOSIS — Y9389 Activity, other specified: Secondary | ICD-10-CM | POA: Diagnosis not present

## 2019-08-27 DIAGNOSIS — Z800 Family history of malignant neoplasm of digestive organs: Secondary | ICD-10-CM | POA: Diagnosis not present

## 2019-08-27 DIAGNOSIS — I5032 Chronic diastolic (congestive) heart failure: Secondary | ICD-10-CM | POA: Diagnosis present

## 2019-08-27 DIAGNOSIS — I4819 Other persistent atrial fibrillation: Secondary | ICD-10-CM | POA: Diagnosis present

## 2019-08-27 DIAGNOSIS — R079 Chest pain, unspecified: Secondary | ICD-10-CM | POA: Diagnosis not present

## 2019-08-27 DIAGNOSIS — I700 Atherosclerosis of aorta: Secondary | ICD-10-CM | POA: Diagnosis not present

## 2019-08-27 DIAGNOSIS — J449 Chronic obstructive pulmonary disease, unspecified: Secondary | ICD-10-CM | POA: Diagnosis not present

## 2019-08-27 DIAGNOSIS — Y92410 Unspecified street and highway as the place of occurrence of the external cause: Secondary | ICD-10-CM | POA: Insufficient documentation

## 2019-08-27 DIAGNOSIS — Z20822 Contact with and (suspected) exposure to covid-19: Secondary | ICD-10-CM | POA: Insufficient documentation

## 2019-08-27 DIAGNOSIS — Z833 Family history of diabetes mellitus: Secondary | ICD-10-CM | POA: Diagnosis not present

## 2019-08-27 DIAGNOSIS — R29810 Facial weakness: Secondary | ICD-10-CM | POA: Diagnosis not present

## 2019-08-27 DIAGNOSIS — Z87891 Personal history of nicotine dependence: Secondary | ICD-10-CM | POA: Insufficient documentation

## 2019-08-27 DIAGNOSIS — I5023 Acute on chronic systolic (congestive) heart failure: Secondary | ICD-10-CM | POA: Diagnosis not present

## 2019-08-27 DIAGNOSIS — E1122 Type 2 diabetes mellitus with diabetic chronic kidney disease: Secondary | ICD-10-CM | POA: Diagnosis present

## 2019-08-27 DIAGNOSIS — I130 Hypertensive heart and chronic kidney disease with heart failure and stage 1 through stage 4 chronic kidney disease, or unspecified chronic kidney disease: Secondary | ICD-10-CM | POA: Diagnosis present

## 2019-08-27 DIAGNOSIS — G8191 Hemiplegia, unspecified affecting right dominant side: Secondary | ICD-10-CM | POA: Diagnosis not present

## 2019-08-27 DIAGNOSIS — R4182 Altered mental status, unspecified: Secondary | ICD-10-CM | POA: Diagnosis present

## 2019-08-27 DIAGNOSIS — K922 Gastrointestinal hemorrhage, unspecified: Secondary | ICD-10-CM

## 2019-08-27 DIAGNOSIS — G4733 Obstructive sleep apnea (adult) (pediatric): Secondary | ICD-10-CM | POA: Diagnosis not present

## 2019-08-27 DIAGNOSIS — I6602 Occlusion and stenosis of left middle cerebral artery: Secondary | ICD-10-CM | POA: Diagnosis not present

## 2019-08-27 DIAGNOSIS — R531 Weakness: Secondary | ICD-10-CM | POA: Diagnosis not present

## 2019-08-27 DIAGNOSIS — Z79899 Other long term (current) drug therapy: Secondary | ICD-10-CM

## 2019-08-27 DIAGNOSIS — Z87442 Personal history of urinary calculi: Secondary | ICD-10-CM | POA: Diagnosis not present

## 2019-08-27 DIAGNOSIS — I63432 Cerebral infarction due to embolism of left posterior cerebral artery: Secondary | ICD-10-CM | POA: Diagnosis not present

## 2019-08-27 DIAGNOSIS — Z8042 Family history of malignant neoplasm of prostate: Secondary | ICD-10-CM

## 2019-08-27 DIAGNOSIS — Z8546 Personal history of malignant neoplasm of prostate: Secondary | ICD-10-CM

## 2019-08-27 DIAGNOSIS — I639 Cerebral infarction, unspecified: Secondary | ICD-10-CM

## 2019-08-27 DIAGNOSIS — R4701 Aphasia: Secondary | ICD-10-CM | POA: Diagnosis present

## 2019-08-27 DIAGNOSIS — I63312 Cerebral infarction due to thrombosis of left middle cerebral artery: Secondary | ICD-10-CM | POA: Diagnosis not present

## 2019-08-27 DIAGNOSIS — I63512 Cerebral infarction due to unspecified occlusion or stenosis of left middle cerebral artery: Secondary | ICD-10-CM

## 2019-08-27 DIAGNOSIS — Z041 Encounter for examination and observation following transport accident: Secondary | ICD-10-CM | POA: Diagnosis not present

## 2019-08-27 DIAGNOSIS — Z9079 Acquired absence of other genital organ(s): Secondary | ICD-10-CM | POA: Diagnosis not present

## 2019-08-27 DIAGNOSIS — R29720 NIHSS score 20: Secondary | ICD-10-CM | POA: Diagnosis present

## 2019-08-27 DIAGNOSIS — R471 Dysarthria and anarthria: Secondary | ICD-10-CM | POA: Diagnosis present

## 2019-08-27 DIAGNOSIS — Z8719 Personal history of other diseases of the digestive system: Secondary | ICD-10-CM | POA: Diagnosis not present

## 2019-08-27 DIAGNOSIS — R29818 Other symptoms and signs involving the nervous system: Secondary | ICD-10-CM | POA: Diagnosis not present

## 2019-08-27 DIAGNOSIS — I63233 Cerebral infarction due to unspecified occlusion or stenosis of bilateral carotid arteries: Secondary | ICD-10-CM | POA: Diagnosis not present

## 2019-08-27 DIAGNOSIS — I63412 Cerebral infarction due to embolism of left middle cerebral artery: Secondary | ICD-10-CM | POA: Diagnosis not present

## 2019-08-27 DIAGNOSIS — K769 Liver disease, unspecified: Secondary | ICD-10-CM | POA: Diagnosis not present

## 2019-08-27 DIAGNOSIS — E1169 Type 2 diabetes mellitus with other specified complication: Secondary | ICD-10-CM | POA: Diagnosis not present

## 2019-08-27 DIAGNOSIS — R4020 Unspecified coma: Secondary | ICD-10-CM | POA: Diagnosis not present

## 2019-08-27 DIAGNOSIS — I482 Chronic atrial fibrillation, unspecified: Secondary | ICD-10-CM | POA: Diagnosis not present

## 2019-08-27 DIAGNOSIS — E1165 Type 2 diabetes mellitus with hyperglycemia: Secondary | ICD-10-CM | POA: Diagnosis not present

## 2019-08-27 DIAGNOSIS — E7800 Pure hypercholesterolemia, unspecified: Secondary | ICD-10-CM | POA: Diagnosis not present

## 2019-08-27 DIAGNOSIS — Y999 Unspecified external cause status: Secondary | ICD-10-CM | POA: Diagnosis not present

## 2019-08-27 DIAGNOSIS — Z743 Need for continuous supervision: Secondary | ICD-10-CM | POA: Diagnosis not present

## 2019-08-27 DIAGNOSIS — N1832 Chronic kidney disease, stage 3b: Secondary | ICD-10-CM | POA: Diagnosis not present

## 2019-08-27 DIAGNOSIS — I517 Cardiomegaly: Secondary | ICD-10-CM | POA: Diagnosis not present

## 2019-08-27 DIAGNOSIS — I110 Hypertensive heart disease with heart failure: Secondary | ICD-10-CM | POA: Insufficient documentation

## 2019-08-27 DIAGNOSIS — I10 Essential (primary) hypertension: Secondary | ICD-10-CM | POA: Diagnosis not present

## 2019-08-27 DIAGNOSIS — I499 Cardiac arrhythmia, unspecified: Secondary | ICD-10-CM | POA: Diagnosis not present

## 2019-08-27 DIAGNOSIS — I63213 Cerebral infarction due to unspecified occlusion or stenosis of bilateral vertebral arteries: Secondary | ICD-10-CM | POA: Diagnosis not present

## 2019-08-27 DIAGNOSIS — S199XXA Unspecified injury of neck, initial encounter: Secondary | ICD-10-CM | POA: Diagnosis not present

## 2019-08-27 DIAGNOSIS — I6389 Other cerebral infarction: Secondary | ICD-10-CM | POA: Diagnosis not present

## 2019-08-27 DIAGNOSIS — J90 Pleural effusion, not elsewhere classified: Secondary | ICD-10-CM | POA: Diagnosis not present

## 2019-08-27 DIAGNOSIS — N183 Chronic kidney disease, stage 3 unspecified: Secondary | ICD-10-CM | POA: Diagnosis not present

## 2019-08-27 DIAGNOSIS — R404 Transient alteration of awareness: Secondary | ICD-10-CM | POA: Diagnosis not present

## 2019-08-27 HISTORY — PX: IR PERCUTANEOUS ART THROMBECTOMY/INFUSION INTRACRANIAL INC DIAG ANGIO: IMG6087

## 2019-08-27 HISTORY — DX: Malignant neoplasm of prostate: C61

## 2019-08-27 HISTORY — PX: RADIOLOGY WITH ANESTHESIA: SHX6223

## 2019-08-27 HISTORY — PX: IR US GUIDE VASC ACCESS RIGHT: IMG2390

## 2019-08-27 HISTORY — PX: IR CT HEAD LTD: IMG2386

## 2019-08-27 LAB — CBC
HCT: 37.1 % — ABNORMAL LOW (ref 39.0–52.0)
Hemoglobin: 11.2 g/dL — ABNORMAL LOW (ref 13.0–17.0)
MCH: 28.6 pg (ref 26.0–34.0)
MCHC: 30.2 g/dL (ref 30.0–36.0)
MCV: 94.9 fL (ref 80.0–100.0)
Platelets: 199 10*3/uL (ref 150–400)
RBC: 3.91 MIL/uL — ABNORMAL LOW (ref 4.22–5.81)
RDW: 15.5 % (ref 11.5–15.5)
WBC: 6 10*3/uL (ref 4.0–10.5)
nRBC: 0 % (ref 0.0–0.2)

## 2019-08-27 LAB — DIFFERENTIAL
Abs Immature Granulocytes: 0.02 10*3/uL (ref 0.00–0.07)
Basophils Absolute: 0 10*3/uL (ref 0.0–0.1)
Basophils Relative: 1 %
Eosinophils Absolute: 0.3 10*3/uL (ref 0.0–0.5)
Eosinophils Relative: 5 %
Immature Granulocytes: 0 %
Lymphocytes Relative: 31 %
Lymphs Abs: 1.8 10*3/uL (ref 0.7–4.0)
Monocytes Absolute: 1.1 10*3/uL — ABNORMAL HIGH (ref 0.1–1.0)
Monocytes Relative: 18 %
Neutro Abs: 2.8 10*3/uL (ref 1.7–7.7)
Neutrophils Relative %: 45 %

## 2019-08-27 LAB — PROTIME-INR
INR: 1 (ref 0.8–1.2)
Prothrombin Time: 13 seconds (ref 11.4–15.2)

## 2019-08-27 LAB — COMPREHENSIVE METABOLIC PANEL
ALT: 25 U/L (ref 0–44)
AST: 31 U/L (ref 15–41)
Albumin: 3.5 g/dL (ref 3.5–5.0)
Alkaline Phosphatase: 109 U/L (ref 38–126)
Anion gap: 8 (ref 5–15)
BUN: 27 mg/dL — ABNORMAL HIGH (ref 8–23)
CO2: 25 mmol/L (ref 22–32)
Calcium: 9 mg/dL (ref 8.9–10.3)
Chloride: 107 mmol/L (ref 98–111)
Creatinine, Ser: 1.89 mg/dL — ABNORMAL HIGH (ref 0.61–1.24)
GFR calc Af Amer: 37 mL/min — ABNORMAL LOW (ref >60)
GFR calc non Af Amer: 32 mL/min — ABNORMAL LOW (ref >60)
Glucose, Bld: 140 mg/dL — ABNORMAL HIGH (ref 70–99)
Potassium: 4.7 mmol/L (ref 3.5–5.1)
Sodium: 140 mmol/L (ref 135–145)
Total Bilirubin: 0.8 mg/dL (ref 0.3–1.2)
Total Protein: 7.2 g/dL (ref 6.5–8.1)

## 2019-08-27 LAB — ETHANOL: Alcohol, Ethyl (B): <10 mg/dL (ref <10)

## 2019-08-27 LAB — SARS CORONAVIRUS 2 BY RT PCR (HOSPITAL ORDER, PERFORMED IN ~~LOC~~ HOSPITAL LAB): SARS Coronavirus 2: NEGATIVE

## 2019-08-27 LAB — APTT: aPTT: 29 seconds (ref 24–36)

## 2019-08-27 SURGERY — IR WITH ANESTHESIA
Anesthesia: General

## 2019-08-27 MED ORDER — SODIUM CHLORIDE 0.9 % IV SOLN: INTRAVENOUS | Status: DC

## 2019-08-27 MED ORDER — NITROGLYCERIN 1 MG/10 ML FOR IR/CATH LAB
INTRA_ARTERIAL | Status: AC
  Filled 2019-08-27: qty 10

## 2019-08-27 MED ORDER — PHENYLEPHRINE HCL-NACL 10-0.9 MG/250ML-% IV SOLN
INTRAVENOUS | Status: DC | PRN
  Administered 2019-08-27: 40 ug/min via INTRAVENOUS

## 2019-08-27 MED ORDER — IOHEXOL 350 MG/ML SOLN
100.0000 mL | Freq: Once | INTRAVENOUS | Status: AC | PRN
  Administered 2019-08-27: 100 mL via INTRAVENOUS

## 2019-08-27 MED ORDER — STROKE: EARLY STAGES OF RECOVERY BOOK
Freq: Once | Status: AC
  Filled 2019-08-27 (×2): qty 1

## 2019-08-27 MED ORDER — CLOPIDOGREL BISULFATE 300 MG PO TABS
ORAL_TABLET | ORAL | Status: AC
  Filled 2019-08-27: qty 1

## 2019-08-27 MED ORDER — ASPIRIN 81 MG PO CHEW
CHEWABLE_TABLET | ORAL | Status: AC
  Filled 2019-08-27: qty 1

## 2019-08-27 MED ORDER — IOHEXOL 300 MG/ML  SOLN
50.0000 mL | Freq: Once | INTRAMUSCULAR | Status: AC | PRN
  Administered 2019-08-27: 5 mL via INTRA_ARTERIAL

## 2019-08-27 MED ORDER — TIROFIBAN HCL IN NACL 5-0.9 MG/100ML-% IV SOLN
INTRAVENOUS | Status: AC
  Filled 2019-08-27: qty 100

## 2019-08-27 MED ORDER — SUCCINYLCHOLINE CHLORIDE 20 MG/ML IJ SOLN
INTRAMUSCULAR | Status: DC | PRN
  Administered 2019-08-27: 120 mg via INTRAVENOUS

## 2019-08-27 MED ORDER — PROPOFOL 10 MG/ML IV BOLUS
INTRAVENOUS | Status: DC | PRN
  Administered 2019-08-27: 120 mg via INTRAVENOUS

## 2019-08-27 MED ORDER — ONDANSETRON HCL 4 MG/2ML IJ SOLN
INTRAMUSCULAR | Status: DC | PRN
  Administered 2019-08-27: 4 mg via INTRAVENOUS

## 2019-08-27 MED ORDER — ACETAMINOPHEN 650 MG RE SUPP: 650.0000 mg | RECTAL | Status: DC | PRN

## 2019-08-27 MED ORDER — ONDANSETRON HCL 4 MG/2ML IJ SOLN: 4.0000 mg | Freq: Four times a day (QID) | INTRAMUSCULAR | Status: DC | PRN

## 2019-08-27 MED ORDER — IOHEXOL 240 MG/ML SOLN
150.0000 mL | Freq: Once | INTRAMUSCULAR | Status: AC | PRN
  Administered 2019-08-27: 30 mL via INTRA_ARTERIAL

## 2019-08-27 MED ORDER — ROCURONIUM 10MG/ML (10ML) SYRINGE FOR MEDFUSION PUMP - OPTIME
INTRAVENOUS | Status: DC | PRN
  Administered 2019-08-27: 100 mg via INTRAVENOUS

## 2019-08-27 MED ORDER — SUGAMMADEX SODIUM 200 MG/2ML IV SOLN
INTRAVENOUS | Status: DC | PRN
Start: 2019-08-27 — End: 2019-08-27
  Administered 2019-08-27: 300 mg via INTRAVENOUS

## 2019-08-27 MED ORDER — ACETAMINOPHEN 160 MG/5ML PO SOLN: 650.0000 mg | ORAL | Status: DC | PRN

## 2019-08-27 MED ORDER — SODIUM CHLORIDE 0.9 % IV SOLN
INTRAVENOUS | Status: DC | PRN
Start: 2019-08-27 — End: 2019-08-27

## 2019-08-27 MED ORDER — CLEVIDIPINE BUTYRATE 0.5 MG/ML IV EMUL
0.0000 mg/h | INTRAVENOUS | Status: DC
  Administered 2019-08-27: 1 mg/h via INTRAVENOUS
  Administered 2019-08-28: 2 mg/h via INTRAVENOUS
  Filled 2019-08-27 (×2): qty 50

## 2019-08-27 MED ORDER — SENNOSIDES-DOCUSATE SODIUM 8.6-50 MG PO TABS: 1.0000 | ORAL_TABLET | Freq: Every evening | ORAL | Status: DC | PRN

## 2019-08-27 MED ORDER — VERAPAMIL HCL 2.5 MG/ML IV SOLN
INTRAVENOUS | Status: AC
  Filled 2019-08-27: qty 2

## 2019-08-27 MED ORDER — ESMOLOL HCL 100 MG/10ML IV SOLN
INTRAVENOUS | Status: DC | PRN
  Administered 2019-08-27 (×2): 20 mg via INTRAVENOUS
  Administered 2019-08-27: 10 mg via INTRAVENOUS

## 2019-08-27 MED ORDER — LIDOCAINE 2% (20 MG/ML) 5 ML SYRINGE
INTRAMUSCULAR | Status: DC | PRN
  Administered 2019-08-27: 60 mg via INTRAVENOUS

## 2019-08-27 MED ORDER — EPTIFIBATIDE 20 MG/10ML IV SOLN
INTRAVENOUS | Status: AC
  Filled 2019-08-27: qty 10

## 2019-08-27 MED ORDER — IOHEXOL 240 MG/ML SOLN
INTRAMUSCULAR | Status: AC
  Filled 2019-08-27: qty 200

## 2019-08-27 MED ORDER — TICAGRELOR 90 MG PO TABS
ORAL_TABLET | ORAL | Status: AC
  Filled 2019-08-27: qty 2

## 2019-08-27 MED ORDER — CLEVIDIPINE BUTYRATE 0.5 MG/ML IV EMUL
INTRAVENOUS | Status: AC
  Filled 2019-08-27: qty 50

## 2019-08-27 MED ORDER — ACETAMINOPHEN 325 MG PO TABS: 650.0000 mg | ORAL_TABLET | ORAL | Status: DC | PRN
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
## 2019-08-27 NOTE — Anesthesia Procedure Notes (Signed)
Procedure Name: Intubation Date/Time: 08/27/2019 9:06 PM Performed by: Clovis Cao, CRNA Pre-anesthesia Checklist: Patient identified, Emergency Drugs available, Suction available and Patient being monitored Patient Re-evaluated:Patient Re-evaluated prior to induction Oxygen Delivery Method: Circle system utilized Preoxygenation: Pre-oxygenation with 100% oxygen Induction Type: IV induction Ventilation: Mask ventilation without difficulty Laryngoscope Size: Glidescope and 4 Grade View: Grade I Tube type: Oral Tube size: 8.0 mm Number of attempts: 1 Airway Equipment and Method: Stylet and Video-laryngoscopy Placement Confirmation: ETT inserted through vocal cords under direct vision,  breath sounds checked- equal and bilateral and CO2 detector Secured at: 24 cm Tube secured with: Tape Dental Injury: Teeth and Oropharynx as per pre-operative assessment  Comments: Glidescope utilized d/t unknown COVID status.

## 2019-08-27 NOTE — Anesthesia Preprocedure Evaluation (Addendum)
Anesthesia Evaluation  Patient identified by MRN, date of birth, ID band Patient awake    Reviewed: Allergy & Precautions, Patient's Chart, lab work & pertinent test results, Unable to perform ROS - Chart review onlyPreop documentation limited or incomplete due to emergent nature of procedure.  History of Anesthesia Complications Negative for: history of anesthetic complications  Airway   TM Distance: >3 FB Neck ROM: Limited   Comment:  Unable to complete airway exam  Dental  (+) Upper Dentures, Missing   Pulmonary sleep apnea , COPD, former smoker,    breath sounds clear to auscultation       Cardiovascular hypertension, Pt. on home beta blockers and Pt. on medications + dysrhythmias Atrial Fibrillation + Valvular Problems/Murmurs MR  Rhythm:Irregular Rate:Tachycardia   '20 TTE - EF 55 to 60%. There is borderline left  ventricular hypertrophy. LA and RA were mildly dilated. Mild to moderate MR, mild TR.    Neuro/Psych CVA, Residual Symptoms    GI/Hepatic   Endo/Other  diabetes, Type 2, Oral Hypoglycemic Agents Obesity   Renal/GU      Musculoskeletal  (+) Arthritis ,   Abdominal   Peds  Hematology   Anesthesia Other Findings Taken off Eliquis ~1 mo ago due to GI bleed Covid test in process   Reproductive/Obstetrics                            Anesthesia Physical Anesthesia Plan  ASA: IV and emergent  Anesthesia Plan: General   Post-op Pain Management:    Induction: Intravenous and Rapid sequence  PONV Risk Score and Plan: 2 and Treatment may vary due to age or medical condition and Ondansetron  Airway Management Planned: Oral ETT  Additional Equipment:   Intra-op Plan:   Post-operative Plan: Possible Post-op intubation/ventilation  Informed Consent:     Only emergency history available and History available from chart only  Plan Discussed with: CRNA and  Anesthesiologist  Anesthesia Plan Comments:        Anesthesia Quick Evaluation
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
## 2019-08-27 NOTE — Code Documentation (Signed)
Pt arrived to [facility] ED for IR procedure. Pt assisted to [place].

## 2019-08-27 NOTE — Sedation Documentation (Signed)
Pt taken to 4N31 on bed with monitor, RN, CRNA for admission. Groin and pulses unchanged, see flowsheet. SBAR given to [place], Therapist, sports. All questions answered to satisfaction.

## 2019-08-27 NOTE — Transfer of Care (Signed)
Immediate Anesthesia Transfer of Care Note  Patient: [name]  Procedure(s) Performed: IR WITH ANESTHESIA (N/A )  Patient Location: ICU  Anesthesia Type:General  Level of Consciousness: drowsy  Airway & Oxygen Therapy: Patient Spontanous Breathing and Patient connected to face mask oxygen  Post-op Assessment: Report given to RN and Post -op Vital signs reviewed and stable  Post vital signs: Reviewed and stable  Last Vitals:  Vitals Value Taken Time  BP 135/102 08/27/19 2230  Temp 36.7 C 08/27/19 2230  Pulse 112 08/27/19 2242  Resp 21 08/27/19 2242  SpO2 100 % 08/27/19 2242  Vitals shown include unvalidated device data.  Last Pain:  Vitals:   08/27/19 2230  TempSrc: Axillary         Complications: No complications documented.

## 2019-08-27 NOTE — ED Notes (Addendum)
Pt CBG 129. Goodman aware.

## 2019-08-27 NOTE — Progress Notes (Signed)
   08/27/19 1906  Clinical Encounter Type  Visited With Patient;Health care provider  Visit Type Initial;Code  Referral From Nurse  Consult/Referral To Chaplain  Spiritual Encounters  Spiritual Needs Other (Comment)  Advance Directives (For Healthcare)  Does Patient Have a Medical Advance Directive? Unable to assess, patient is non-responsive or altered mental status  Mental Health Advance Directives  Does Patient Have a Mental Health Advance Directive? Unable to assess, patient is non-responsive or altered mental status  Chaplain received page for code stroke. Chaplain arrived and made pastoral presence known. Care team was working with patient. Chaplain ask if there were family available and nurse said family had been notify and Chaplain ask nurse to contact If family needed a Chaplain when they arrive.

## 2019-08-27 NOTE — ED Notes (Signed)
Chart checked for emtala
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
## 2019-08-27 NOTE — Procedures (Signed)
INTERVENTIONAL NEURORADIOLOGY BRIEF POSTPROCEDURE NOTE  DIAGNOSTIC CEREBRAL ANGIOGRAM AND MECHANICAL THROMBECTOMY  Attending: Dr. Pedro Earls  Assistant: None  Diagnosis: Left MCA occlusion  Access site: RCFA  Access closure: 8 [place] angioseal  Anesthesia: General  Medication used: refer to anesthesia documentation.  Complications: None.  Estimated blood loss: 60 mL  Specimen: None.  Findings: Proximal left M1/MCA occlusion. Mechanical thrombectomy performed with contact aspiration, 1 pass with complete recanalization (TICI3). No embolus to new territory. No hemorrhage on post procedure flat panel CT.  The patient tolerated the procedure well without incident or complication and is in stable condition.   Delay: Difficult femoral access due to severe athero and tortuosity with need to exchange sheath.  PLAN: - ICU level of care - SBP 120-140 mmHg - Bed rest x6 hour.

## 2019-08-28 ENCOUNTER — Inpatient Hospital Stay (HOSPITAL_COMMUNITY): Payer: Medicare Other

## 2019-08-28 ENCOUNTER — Other Ambulatory Visit: Payer: Self-pay

## 2019-08-28 ENCOUNTER — Encounter (HOSPITAL_COMMUNITY): Payer: Self-pay | Admitting: Neurology

## 2019-08-28 DIAGNOSIS — I482 Chronic atrial fibrillation, unspecified: Secondary | ICD-10-CM

## 2019-08-28 DIAGNOSIS — E1165 Type 2 diabetes mellitus with hyperglycemia: Secondary | ICD-10-CM

## 2019-08-28 DIAGNOSIS — K922 Gastrointestinal hemorrhage, unspecified: Secondary | ICD-10-CM

## 2019-08-28 DIAGNOSIS — I63412 Cerebral infarction due to embolism of left middle cerebral artery: Principal | ICD-10-CM

## 2019-08-28 DIAGNOSIS — I10 Essential (primary) hypertension: Secondary | ICD-10-CM

## 2019-08-28 DIAGNOSIS — I6389 Other cerebral infarction: Secondary | ICD-10-CM

## 2019-08-28 LAB — BASIC METABOLIC PANEL
Anion gap: 10 (ref 5–15)
BUN: 18 mg/dL (ref 8–23)
CO2: 20 mmol/L — ABNORMAL LOW (ref 22–32)
Calcium: 8.8 mg/dL — ABNORMAL LOW (ref 8.9–10.3)
Chloride: 107 mmol/L (ref 98–111)
Creatinine, Ser: 1.58 mg/dL — ABNORMAL HIGH (ref 0.61–1.24)
GFR calc Af Amer: 46 mL/min — ABNORMAL LOW (ref >60)
GFR calc non Af Amer: 40 mL/min — ABNORMAL LOW (ref >60)
Glucose, Bld: 161 mg/dL — ABNORMAL HIGH (ref 70–99)
Potassium: 4.9 mmol/L (ref 3.5–5.1)
Sodium: 137 mmol/L (ref 135–145)

## 2019-08-28 LAB — GLUCOSE, CAPILLARY
Glucose-Capillary: 180 mg/dL — ABNORMAL HIGH (ref 70–99)
Glucose-Capillary: 161 mg/dL — ABNORMAL HIGH (ref 70–99)
Glucose-Capillary: 151 mg/dL — ABNORMAL HIGH (ref 70–99)
Glucose-Capillary: 118 mg/dL — ABNORMAL HIGH (ref 70–99)
Glucose-Capillary: 119 mg/dL — ABNORMAL HIGH (ref 70–99)

## 2019-08-28 LAB — ECHOCARDIOGRAM COMPLETE
AR max vel: 3.66 cm2
AV Area VTI: 3.58 cm2
AV Area mean vel: 3.58 cm2
AV Mean grad: 1 mmHg
AV Peak grad: 2.5 mmHg
Ao pk vel: 0.8 m/s
Height: 72 in
S' Lateral: 3.5 cm
Weight: 3523.83 oz

## 2019-08-28 LAB — CBC
HCT: 37.5 % — ABNORMAL LOW (ref 39.0–52.0)
Hemoglobin: 11.2 g/dL — ABNORMAL LOW (ref 13.0–17.0)
MCH: 28.6 pg (ref 26.0–34.0)
MCHC: 29.9 g/dL — ABNORMAL LOW (ref 30.0–36.0)
MCV: 95.9 fL (ref 80.0–100.0)
Platelets: 192 10*3/uL (ref 150–400)
RBC: 3.91 MIL/uL — ABNORMAL LOW (ref 4.22–5.81)
RDW: 15.5 % (ref 11.5–15.5)
WBC: 6.4 10*3/uL (ref 4.0–10.5)
nRBC: 0 % (ref 0.0–0.2)

## 2019-08-28 LAB — LIPID PANEL
Cholesterol: 78 mg/dL (ref 0–200)
HDL: 42 mg/dL (ref >40)
LDL Cholesterol: 27 mg/dL (ref 0–99)
Total CHOL/HDL Ratio: 1.9 RATIO
Triglycerides: 44 mg/dL (ref <150)
VLDL: 9 mg/dL (ref 0–40)

## 2019-08-28 LAB — HEMOGLOBIN A1C
Hgb A1c MFr Bld: 6.4 % — ABNORMAL HIGH (ref 4.8–5.6)
Mean Plasma Glucose: 136.98 mg/dL

## 2019-08-28 LAB — MRSA PCR SCREENING: MRSA by PCR: NEGATIVE

## 2019-08-28 MED ORDER — INSULIN ASPART 100 UNIT/ML ~~LOC~~ SOLN: 0.0000 [IU] | Freq: Every day | SUBCUTANEOUS | Status: DC

## 2019-08-28 MED ORDER — ENOXAPARIN SODIUM 40 MG/0.4ML ~~LOC~~ SOLN
40.0000 mg | SUBCUTANEOUS | Status: DC
  Administered 2019-08-28 – 2019-08-30 (×3): 40 mg via SUBCUTANEOUS
  Filled 2019-08-28 (×3): qty 0.4

## 2019-08-28 MED ORDER — FERROUS SULFATE 325 (65 FE) MG PO TABS
325.0000 mg | ORAL_TABLET | Freq: Every day | ORAL | Status: DC
  Administered 2019-08-28 – 2019-08-31 (×4): 325 mg via ORAL
  Filled 2019-08-28 (×4): qty 1

## 2019-08-28 MED ORDER — PANTOPRAZOLE SODIUM 40 MG PO TBEC
40.0000 mg | DELAYED_RELEASE_TABLET | Freq: Every day | ORAL | Status: DC
  Administered 2019-08-28 – 2019-08-31 (×4): 40 mg via ORAL
  Filled 2019-08-28 (×4): qty 1

## 2019-08-28 MED ORDER — METOPROLOL TARTRATE 5 MG/5ML IV SOLN: 5.0000 mg | INTRAVENOUS | Status: DC | PRN

## 2019-08-28 MED ORDER — ORAL CARE MOUTH RINSE
15.0000 mL | Freq: Two times a day (BID) | OROMUCOSAL | Status: DC
  Administered 2019-08-28 – 2019-08-31 (×8): 15 mL via OROMUCOSAL

## 2019-08-28 MED ORDER — CHLORHEXIDINE GLUCONATE CLOTH 2 % EX PADS
6.0000 | MEDICATED_PAD | Freq: Every day | CUTANEOUS | Status: DC
  Administered 2019-08-28 – 2019-08-31 (×2): 6 via TOPICAL

## 2019-08-28 MED ORDER — LABETALOL HCL 5 MG/ML IV SOLN: 5.0000 mg | INTRAVENOUS | Status: DC | PRN

## 2019-08-28 MED ORDER — ASPIRIN EC 325 MG PO TBEC
325.0000 mg | DELAYED_RELEASE_TABLET | Freq: Every day | ORAL | Status: DC
  Administered 2019-08-28 – 2019-08-31 (×4): 325 mg via ORAL
  Filled 2019-08-28 (×4): qty 1

## 2019-08-28 MED ORDER — ATORVASTATIN CALCIUM 10 MG PO TABS
10.0000 mg | ORAL_TABLET | Freq: Every day | ORAL | Status: DC
  Administered 2019-08-28 – 2019-08-31 (×4): 10 mg via ORAL
  Filled 2019-08-28 (×4): qty 1

## 2019-08-28 MED ORDER — STROKE: EARLY STAGES OF RECOVERY BOOK
Status: AC
  Filled 2019-08-28: qty 1

## 2019-08-28 MED ORDER — INSULIN ASPART 100 UNIT/ML ~~LOC~~ SOLN
0.0000 [IU] | Freq: Three times a day (TID) | SUBCUTANEOUS | Status: DC
  Administered 2019-08-28 (×2): 2 [IU] via SUBCUTANEOUS
  Administered 2019-08-29 – 2019-08-30 (×2): 1 [IU] via SUBCUTANEOUS

## 2019-08-28 MED ORDER — METOPROLOL TARTRATE 25 MG PO TABS
25.0000 mg | ORAL_TABLET | Freq: Two times a day (BID) | ORAL | Status: DC
  Administered 2019-08-28 – 2019-08-31 (×7): 25 mg via ORAL
  Filled 2019-08-28 (×7): qty 1

## 2019-08-28 NOTE — Progress Notes (Signed)
Inpatient Rehab Admissions Coordinator Note:   Per therapy recommendations, pt was screened for CIR candidacy by Jullisa Grigoryan, MS CCC-SLP. At this time, Pt. Appears to have functional decline and is a good candidate for CIR. Will pursue order for rehab consult per protocol.  Please contact me with questions.   Kallin Henk, MS, CCC-SLP Rehab Admissions Coordinator  [phone] (celll) [phone] (office)   
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
## 2019-08-28 NOTE — Progress Notes (Signed)
  Echocardiogram 2D Echocardiogram has been performed.  [name] 08/28/2019, 5:13 PM
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
## 2019-08-28 NOTE — Procedures (Signed)
Echo attempted. Patient not in room. Will attempt again later.
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
## 2019-08-29 DIAGNOSIS — E7800 Pure hypercholesterolemia, unspecified: Secondary | ICD-10-CM

## 2019-08-29 DIAGNOSIS — N1832 Chronic kidney disease, stage 3b: Secondary | ICD-10-CM

## 2019-08-29 DIAGNOSIS — K5909 Other constipation: Secondary | ICD-10-CM | POA: Insufficient documentation

## 2019-08-29 LAB — CBC
HCT: 35.6 % — ABNORMAL LOW (ref 39.0–52.0)
Hemoglobin: 10.5 g/dL — ABNORMAL LOW (ref 13.0–17.0)
MCH: 28.4 pg (ref 26.0–34.0)
MCHC: 29.5 g/dL — ABNORMAL LOW (ref 30.0–36.0)
MCV: 96.2 fL (ref 80.0–100.0)
Platelets: 190 10*3/uL (ref 150–400)
RBC: 3.7 MIL/uL — ABNORMAL LOW (ref 4.22–5.81)
RDW: 15.5 % (ref 11.5–15.5)
WBC: 5 10*3/uL (ref 4.0–10.5)
nRBC: 0 % (ref 0.0–0.2)

## 2019-08-29 LAB — BASIC METABOLIC PANEL
Anion gap: 8 (ref 5–15)
BUN: 18 mg/dL (ref 8–23)
CO2: 26 mmol/L (ref 22–32)
Calcium: 8.8 mg/dL — ABNORMAL LOW (ref 8.9–10.3)
Chloride: 106 mmol/L (ref 98–111)
Creatinine, Ser: 1.71 mg/dL — ABNORMAL HIGH (ref 0.61–1.24)
GFR calc Af Amer: 42 mL/min — ABNORMAL LOW (ref >60)
GFR calc non Af Amer: 36 mL/min — ABNORMAL LOW (ref >60)
Glucose, Bld: 120 mg/dL — ABNORMAL HIGH (ref 70–99)
Potassium: 4.4 mmol/L (ref 3.5–5.1)
Sodium: 140 mmol/L (ref 135–145)

## 2019-08-29 LAB — GLUCOSE, CAPILLARY
Glucose-Capillary: 113 mg/dL — ABNORMAL HIGH (ref 70–99)
Glucose-Capillary: 90 mg/dL (ref 70–99)
Glucose-Capillary: 136 mg/dL — ABNORMAL HIGH (ref 70–99)
Glucose-Capillary: 110 mg/dL — ABNORMAL HIGH (ref 70–99)
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
## 2019-08-29 NOTE — Progress Notes (Signed)
Pt has been tranferred to the unit. Pt reports no pain. Wife at bedside will return shortly. All IV are intact. Telemetry has been connected. Belongings sent with the patient. Call bell and telephone are within reach.

## 2019-08-29 NOTE — Progress Notes (Signed)
Inpatient Rehab Admissions Coordinator:   Met with patient at bedside to discuss potential CIR admission. Pt. Stated interest. Pt.'s wife was present in room and states that she can provide 24/7 support at discharge but would like to think about various rehab options before making a decision.  Will follow for potential admit next week, pending bed availability and Pt./family choice.   Clemens Catholic, [place], [place] Admissions Coordinator  [phone] ([place]) [phone] (office)

## 2019-08-29 NOTE — Progress Notes (Signed)
Patient transferred to 3W. Report given to [facility].  Wife took dentures home per patient. No other belongings. 2L O2 via [place] in place, spo2 100% VSS, in NAD.  Vitals:   08/29/19 1100 08/29/19 1200  BP: 135/88 135/88  Pulse: 76 78  Resp: (!) 24 (!) 21  Temp:  98.8 F (37.1 C)  SpO2: 97% 96%

## 2019-08-30 ENCOUNTER — Encounter (HOSPITAL_COMMUNITY): Payer: Self-pay | Admitting: Interventional Radiology

## 2019-08-30 LAB — GLUCOSE, CAPILLARY
Glucose-Capillary: 129 mg/dL — ABNORMAL HIGH (ref 70–99)
Glucose-Capillary: 105 mg/dL — ABNORMAL HIGH (ref 70–99)
Glucose-Capillary: 128 mg/dL — ABNORMAL HIGH (ref 70–99)
Glucose-Capillary: 116 mg/dL — ABNORMAL HIGH (ref 70–99)
Glucose-Capillary: 113 mg/dL — ABNORMAL HIGH (ref 70–99)

## 2019-08-30 LAB — BASIC METABOLIC PANEL
Anion gap: 10 (ref 5–15)
BUN: 21 mg/dL (ref 8–23)
CO2: 25 mmol/L (ref 22–32)
Calcium: 9.2 mg/dL (ref 8.9–10.3)
Chloride: 105 mmol/L (ref 98–111)
Creatinine, Ser: 1.63 mg/dL — ABNORMAL HIGH (ref 0.61–1.24)
GFR calc Af Amer: 44 mL/min — ABNORMAL LOW (ref >60)
GFR calc non Af Amer: 38 mL/min — ABNORMAL LOW (ref >60)
Glucose, Bld: 134 mg/dL — ABNORMAL HIGH (ref 70–99)
Potassium: 4.4 mmol/L (ref 3.5–5.1)
Sodium: 140 mmol/L (ref 135–145)

## 2019-08-30 LAB — CBC
HCT: 39.2 % (ref 39.0–52.0)
Hemoglobin: 11.8 g/dL — ABNORMAL LOW (ref 13.0–17.0)
MCH: 28.9 pg (ref 26.0–34.0)
MCHC: 30.1 g/dL (ref 30.0–36.0)
MCV: 95.8 fL (ref 80.0–100.0)
Platelets: 200 10*3/uL (ref 150–400)
RBC: 4.09 MIL/uL — ABNORMAL LOW (ref 4.22–5.81)
RDW: 15.4 % (ref 11.5–15.5)
WBC: 5.7 10*3/uL (ref 4.0–10.5)
nRBC: 0 % (ref 0.0–0.2)

## 2019-08-30 NOTE — NC FL2 (Addendum)
[place] LEVEL OF CARE SCREENING TOOL     IDENTIFICATION  Patient Name: [name] Birthdate: 11/02/1933 Sex: male Admission Date (Current Location): 08/27/2019  [facility] and [place] Number:  Engineering geologist and Address:  [facility], [place] [address] [place], [place] [address]      Provider Number: [id number]  Attending Physician Name and Address:  Rosalin Hawking, MD  Relative Name and Phone Number:       Current Level of Care: Hospital Recommended Level of Care: [place] Prior Approval Number:    Date Approved/Denied:   PASRR Number: [id number]  Discharge Plan: SNF    Current Diagnoses: Patient Active Problem List   Diagnosis Date Noted  . Other constipation 08/29/2019  . Acute ischemic left MCA stroke ([place]) 08/27/2019  . Normocytic anemia 08/05/2019  . Prostate cancer ([place]) 08/05/2019  . Goals of care, counseling/discussion 07/22/2019  . Fecal impaction ([place]) 07/11/2019  . Lower GI bleed 06/30/2019  . Diverticulosis of colon 02/07/2019  . Chronic obstructive pulmonary disease ([place])   . Chronic a-fib ([place])   . Type 2 diabetes mellitus with hyperglycemia ([place]) 10/23/2018  . Secondary hypercoagulability disorder ([place]) 10/23/2018  . Acute or subacute form of ischemic heart disease ([place]) 10/23/2018  . Need for vaccination against Streptococcus pneumoniae using pneumococcal conjugate vaccine 13 10/23/2018  . Chronic mesenteric ischemia ([place]) 07/28/2018  . Abdominal aortic aneurysm without rupture ([place]) 07/28/2018  . Primary osteoarthritis of left hip 02/12/2018  . Bilateral hearing loss due to cerumen impaction 10/06/2017  . Chronic systolic heart failure ([place]) 08/19/2017  . Atrial fibrillation with RVR ([place]) 08/02/2017  . Acute on chronic congestive heart failure ([place]) 08/02/2017  . Prostatic hypertrophy 07/20/2017  . Dysuria 07/20/2017  . Hyperpigmentation 04/10/2017  . Uncontrolled type 2 diabetes  mellitus with hyperglycemia ([place]) 03/24/2017  . Encounter for general adult medical examination with abnormal findings 03/24/2017  . Chest pain 05/04/2016  . Elevated troponin 05/04/2016  . Essential hypertension 07/06/2014  . Cough 03/21/2014  . OSA (obstructive sleep apnea) 03/21/2014  . Paroxysmal A-fib ([place]) 03/21/2014  . Hx of adenomatous colonic polyps 06/28/2013  . Liver cyst 06/28/2013  . Chronic kidney disease 06/08/2013  . Type 2 diabetes mellitus with stage 3 chronic kidney disease, without long-term current use of insulin ([place]) 06/08/2013  . Hyperlipidemia, mixed 06/08/2013  . Valvular heart disease 06/08/2013    Orientation RESPIRATION BLADDER Height & Weight     Self, Time, Situation, Place  Normal Continent Weight: 220 lb 3.8 oz (99.9 kg) Height:  6' (182.9 cm)  BEHAVIORAL SYMPTOMS/MOOD NEUROLOGICAL BOWEL NUTRITION STATUS      Continent Diet (See Discharge Summary)  AMBULATORY STATUS COMMUNICATION OF NEEDS Skin   Extensive Assist Verbally Skin abrasions, Surgical wounds (Abrasion L Elbow; Closed Incision R Groin with gauze change daily)                       Personal Care Assistance Level of Assistance  Bathing, Feeding, Dressing Bathing Assistance: Maximum assistance Feeding assistance: Independent Dressing Assistance: Maximum assistance     Functional Limitations Info  Sight, Speech, Hearing Sight Info: Adequate Hearing Info: Impaired Speech Info: Adequate    SPECIAL CARE FACTORS FREQUENCY  PT (By licensed PT), OT (By licensed OT)     PT Frequency: 5X week OT Frequency: 5x week            Contractures      Additional Factors  Info  Code Status, Allergies, Insulin Sliding Scale Code Status Info: Full Allergies Info: No Known Allergies   Insulin Sliding Scale Info: insulin aspart (novoLOG) injection 0-5 Units daily at bedtime, insulin aspart (novoLOG) injection 0-9 Units 3x daily with meals       Current Medications (08/30/2019):  This is  the current hospital active medication list Current Facility-Administered Medications  Medication Dose Route Frequency Provider Last Rate Last Admin  . acetaminophen (TYLENOL) tablet 650 mg  650 mg Oral Q4H PRN Aroor, Lanice Schwab, MD       Or  . acetaminophen (TYLENOL) 160 MG/5ML solution 650 mg  650 mg Per Tube Q4H PRN Aroor, Lanice Schwab, MD       Or  . acetaminophen (TYLENOL) suppository 650 mg  650 mg Rectal Q4H PRN Aroor, Lanice Schwab, MD      . aspirin EC tablet 325 mg  325 mg Oral Daily Rosalin Hawking, MD   325 mg at 08/30/19 1117  . atorvastatin (LIPITOR) tablet 10 mg  10 mg Oral Daily Rosalin Hawking, MD   10 mg at 08/30/19 1117  . Chlorhexidine Gluconate Cloth 2 % PADS 6 each  6 each Topical Daily Aroor, Lanice Schwab, MD   6 each at 08/28/19 1100  . enoxaparin (LOVENOX) injection 40 mg  40 mg Subcutaneous Q24H Rosalin Hawking, MD   40 mg at 08/29/19 1816  . ferrous sulfate tablet 325 mg  325 mg Oral Daily Rosalin Hawking, MD   325 mg at 08/30/19 1117  . insulin aspart (novoLOG) injection 0-5 Units  0-5 Units Subcutaneous QHS Aroor, Karena Addison R, MD      . insulin aspart (novoLOG) injection 0-9 Units  0-9 Units Subcutaneous TID WC Aroor, Karena Addison R, MD   1 Units at 08/30/19 1217  . labetalol (NORMODYNE) injection 5-20 mg  5-20 mg Intravenous Q2H PRN Rosalin Hawking, MD      . MEDLINE mouth rinse  15 mL Mouth Rinse BID Aroor, Lanice Schwab, MD   15 mL at 08/30/19 1118  . metoprolol tartrate (LOPRESSOR) tablet 25 mg  25 mg Oral BID Rosalin Hawking, MD   25 mg at 08/30/19 1117  . ondansetron (ZOFRAN) injection 4 mg  4 mg Intravenous Q6H PRN de Sindy Messing, Erven Colla, MD      . pantoprazole (PROTONIX) EC tablet 40 mg  40 mg Oral Daily Rosalin Hawking, MD   40 mg at 08/30/19 1117  . senna-docusate (Senokot-S) tablet 1 tablet  1 tablet Oral QHS PRN Aroor, Lanice Schwab, MD         Discharge Medications: Please see discharge summary for a list of discharge medications.  Relevant Imaging Results:  Relevant Lab  Results:   Additional Information  ssn [id number]  [address], LCSWA

## 2019-08-30 NOTE — Progress Notes (Signed)
Referring Physician(s): Code stroke- Aroor, Karena Addison R  Supervising Physician: Pedro Earls  Patient Status:  [facility] - In-pt  Chief Complaint: None  Subjective:  CVA s/p cerebral arteriogram with emergent mechanical thrombectomy of proximal left MCA M1 occlusion achieving a TICI 3 revascularization via right femoral approach 08/27/2019 by Dr. Karenann Cai. Patient awake and alert sitting in chair with no complaints at this time. Can spontaneously move all extremities. Right groin puncture site c/d/i.   Allergies: Patient has no known allergies.  Medications: Prior to Admission medications   Medication Sig Start Date End Date Taking? Authorizing Provider  albuterol (VENTOLIN HFA) 108 (90 Base) MCG/ACT inhaler Inhale 2 puffs into the lungs every 4 (four) hours as needed for wheezing or shortness of breath. 06/28/19  Yes Boscia, Greer Ee, NP  atorvastatin (LIPITOR) 40 MG tablet Take 1 tablet (40 mg total) by mouth daily at 6 PM. 05/17/19  Yes Boscia, Heather E, NP  ferrous sulfate 325 (65 FE) MG tablet Take 325 mg by mouth daily.    Yes [provider]  metoprolol tartrate (LOPRESSOR) 25 MG tablet Take 25 mg by mouth 2 (two) times daily.   Yes [provider]  sitaGLIPtin (JANUVIA) 25 MG tablet Take 1 tablet (25 mg total) by mouth daily. 06/29/19  Yes Boscia, Heather E, NP  glucose blood (ONETOUCH VERIO) test strip USE ONCE DAILY. DX E11.65. 03/15/19   Ronnell Freshwater, NP  pantoprazole (PROTONIX) 40 MG tablet TAKE 1 TABLET(40 MG) BY MOUTH DAILY Patient not taking: Reported on 08/28/2019 03/23/19   Ronnell Freshwater, NP     Vital Signs: BP (!) 141/84 (BP Location: Right Arm)   Pulse 72   Temp 98.1 F (36.7 C) (Oral)   Resp 20   Ht 6' (1.829 m)   Wt 220 lb 3.8 oz (99.9 kg)   SpO2 99%   BMI 29.87 kg/m   Physical Exam Vitals and nursing note reviewed.  Constitutional:      General: He is not in acute distress.    Appearance: Normal  appearance.  Pulmonary:     Effort: Pulmonary effort is normal. No respiratory distress.  Skin:    General: Skin is warm and dry.     Comments: Right groin puncture site soft without active bleeding or hematoma.  Neurological:     Mental Status: He is alert.     Comments: Alert, awake, and oriented x3. Speech and comprehension intact. PERRL bilaterally. EOMs intact bilaterally without nystagmus or subjective diplopia. No facial asymmetry. Tongue midline. Can spontaneously move all extremities, hand grip strength equal bilaterally. No pronator drift. Distal pulses (DPs) 1+ bilaterally.     Imaging: CT CERVICAL SPINE WO CONTRAST  Result Date: 08/28/2019 CLINICAL DATA:  Head trauma, minor.  Head trauma. EXAM: CT CERVICAL SPINE WITHOUT CONTRAST TECHNIQUE: Multidetector CT imaging of the cervical spine was performed without intravenous contrast. Multiplanar CT image reconstructions were also generated. COMPARISON:  CTA neck 08/27/2018 FINDINGS: Alignment: Straightening of the expected cervical lordosis. No significant spondylolisthesis Skull base and vertebrae: The basion-dental and atlanto-dental intervals are maintained.No evidence of acute fracture to the cervical spine. Soft tissues and spinal canal: No prevertebral fluid or swelling. No visible canal hematoma. Disc levels: Cervical spondylosis with multilevel disc space narrowing, posterior disc osteophytes, uncovertebral and facet hypertrophy. No high-grade bony spinal canal stenosis. Multilevel bony neural foraminal narrowing. Upper chest: No consolidation within the imaged lung apices. No visible pneumothorax. Other: Nonspecific low-density 9 mm cutaneous/subcutaneous  lesion within the midline posterior neck at the C4-C5 level, which is nonspecific but may reflect a cyst IMPRESSION: No evidence of acute fracture to the cervical spine. Cervical spondylosis as described. Nonspecific low-density 9 mm cutaneous/subcutaneous lesion within the  midline posterior neck at the C4-C5 level, which is nonspecific but may reflect a cyst. Electronically Signed   By: Kellie Simmering DO   On: 08/28/2019 10:40   MR ANGIO HEAD WO CONTRAST  Result Date: 08/28/2019 CLINICAL DATA:  Stroke, follow-up. EXAM: MRA HEAD WITHOUT CONTRAST TECHNIQUE: Angiographic images of the Circle of Willis were obtained using MRA technique without intravenous contrast. COMPARISON:  Noncontrast CT head and CT angiogram head/neck performed 08/27/2019. FINDINGS: The examination is mild to moderately motion degraded which limits evaluation for stenoses and for small aneurysms. The intracranial internal carotid arteries are patent. The revascularized M1 left middle cerebral artery remains patent. The M1 right middle cerebral artery is patent. No M2 proximal branch occlusion is identified. The anterior cerebral arteries are patent. Due to the degree of motion degradation, relatively severe multifocal atherosclerotic irregularity of both anterior cerebral arteries was better appreciated on the prior CTA of 08/27/2019. The proximal V4 right vertebral artery is poorly delineated and may be severely stenosed or partially occluded. Some flow related signal is seen within the distal right vertebral artery. The dominant left vertebral artery is patent without significant stenosis, as is the basilar artery. The posterior cerebral arteries are patent. Apparent moderate/severe stenosis of the P2 posterior cerebral artery which may be accentuated by motion artifact. Apparent severe focal stenosis within the proximal left PICA (series 355, image 5). No intracranial aneurysm is identified. IMPRESSION: Moderately motion-degraded examination, limiting evaluation for stenoses and small aneurysm. The revascularized M1 left middle cerebral artery remains patent. Poorly delineated proximal V4 right vertebral artery which, as before, is likely severely stenosed or partially occluded. Due to the degree of motion  degradation, fairly severe multifocal atherosclerotic irregularity of the anterior cerebral arteries was better appreciated on the prior CTA of 08/27/2019. Apparent moderate/severe stenosis of the P2 left posterior cerebral artery, which may be accentuated by motion artifact on the current study. Apparent severe focal stenosis within the proximal left PICA. Electronically Signed   By: Kellie Simmering DO   On: 08/28/2019 11:46   MR BRAIN WO CONTRAST  Result Date: 08/28/2019 CLINICAL DATA:  Stroke, follow-up. EXAM: MRI HEAD WITHOUT CONTRAST TECHNIQUE: Multiplanar, multiecho pulse sequences of the brain and surrounding structures were obtained without intravenous contrast. COMPARISON:  Noncontrast head CT, CT angiogram head/neck and CT perfusion 08/27/2019. FINDINGS: Brain: Multiple sequences are significantly motion degraded, limiting evaluation. Most notably there is moderate/severe motion degradation of the coronal diffusion-weighted sequence, moderate/severe motion degradation of the sagittal T1 weighted sequence, moderate motion degradation of the axial T2 weighted sequence, moderate/severe motion degradation of the axial T2/FLAIR sequence, moderate/severe motion degradation of the axial T1 weighted sequence and severe motion degradation of the coronal T2 weighted sequence. Moderate generalized parenchymal atrophy There is an acute infarct within the left corona radiata/lentiform nucleus measuring 1.8 x 1.0 x 2.7 cm (series 2, image 23) (series 4, image 18). Corresponding T2/FLAIR hyperintensity at this site. There are additional punctate acute infarcts within the left caudate head. Additionally, there is a subcentimeter focus of SWI signal loss in this region suspicious for petechial hemorrhage. Incompletely assessed background scattered T2/FLAIR hyperintensity within the cerebral white matter which is nonspecific, but consistent with chronic small vessel ischemic disease. No extra-axial fluid collection. No  midline shift.  Vascular: No definite loss of proximal arterial flow voids. Skull and upper cervical spine: No focal marrow lesion identified within limitations of motion degradation Sinuses/Orbits: Visualized orbits show no acute finding. Mild ethmoid sinus mucosal thickening. No significant mastoid effusion IMPRESSION: Significantly motion degraded and limited examination as described. 2.7 cm acute infarct within the left corona radiata/lentiform nucleus. There is a subcentimeter focus of SWI signal loss at this site suspicious for petechial hemorrhage. Additional punctate acute infarcts within the left caudate head. Background generalized parenchymal atrophy and chronic small vessel ischemic disease. Mild ethmoid sinus mucosal thickening. Electronically Signed   By: Kellie Simmering DO   On: 08/28/2019 11:32   CT CEREBRAL PERFUSION W CONTRAST  Result Date: 08/27/2019 CLINICAL DATA:  Stroke.  Right-sided weakness. EXAM: CT ANGIOGRAPHY HEAD AND NECK CT PERFUSION BRAIN TECHNIQUE: Multidetector CT imaging of the head and neck was performed using the standard protocol during bolus administration of intravenous contrast. Multiplanar CT image reconstructions and MIPs were obtained to evaluate the vascular anatomy. Carotid stenosis measurements (when applicable) are obtained utilizing NASCET criteria, using the distal internal carotid diameter as the denominator. Multiphase CT imaging of the brain was performed following IV bolus contrast injection. Subsequent parametric perfusion maps were calculated using RAPID software. CONTRAST:  177mL OMNIPAQUE IOHEXOL 350 MG/ML SOLN COMPARISON:  CT head 08/27/2019 FINDINGS: CTA NECK FINDINGS Aortic arch: Mild atherosclerotic disease in the aortic arch. Bovine branching pattern. Proximal great vessels patent without stenosis. Right carotid system: Right carotid widely patent without stenosis or atherosclerotic disease. Motion degrades image quality. Left carotid system: Left carotid  widely patent without stenosis or atherosclerotic disease. Vertebral arteries: Left vertebral artery is dominant and patent to the basilar. Mild stenosis at the origin. Right vertebral artery is occluded at the origin and reconstitutes at the C3-4 level as a small vessel which appears to be patent to the basilar. Skeleton: Cervical spondylosis moderate in degree. No acute skeletal abnormality. Other neck: Negative for mass or adenopathy. Upper chest: Lung apices clear bilaterally. Review of the MIP images confirms the above findings CTA HEAD FINDINGS Anterior circulation: Occlusion left M1 segment appears acute. There is opacification of the left M2 and M3 branches due to collateral circulation. Mild atherosclerotic calcification right cavernous carotid without significant stenosis. Left cavernous carotid widely patent. Atherosclerotic irregularity in the anterior cerebral arteries bilaterally which is relatively severe. Atherosclerotic irregularity in right middle cerebral artery branches without large vessel occlusion. Posterior circulation: Left vertebral artery is patent. There appears to be a severe stenosis or possibly segmental occlusion of the V4 segment on the right. Basilar is patent. Superior cerebellar and posterior cerebral arteries patent bilaterally. Venous sinuses: Normal venous enhancement. Anatomic variants: None Review of the MIP images confirms the above findings CT Brain Perfusion Findings: ASPECTS: 10 CBF (<30%) Volume: 14mL Perfusion (Tmax>6.0s) volume: 6mL Mismatch Volume: 76mL Infarction Location:Left MCA territory IMPRESSION: 1. CT perfusion demonstrates 98 mL of delayed perfusion left MCA territory without core infarct. Aspects 10. 2. Occlusion left M1 segment which appears acute. There is collateral circulation to left MCA branches. 3. Carotid artery in the neck is normal bilaterally 4. Left vertebral artery is patent without significant stenosis 5. Occlusion of the proximal half of the  right vertebral artery with additional high-grade stenosis or occlusion right V4 segment. 6. Intracranial atherosclerotic disease. 7. These results were called by telephone at the time of interpretation on 08/27/2019 at 7:50 Pm to provider [facility] , who verbally acknowledged these results. Electronically Signed   By:  Franchot Gallo M.D.   On: 08/27/2019 20:01   DG Chest Port 1 View  Result Date: 08/27/2019 CLINICAL DATA:  Acute ischemic stroke EXAM: PORTABLE CHEST 1 VIEW COMPARISON:  Radiograph 06/19/2019, CT 06/20/2019 FINDINGS: Cardiomegaly similar to comparison CT and likely accentuated by the portable technique and low lung volumes. Some streaky opacities are present in the lung bases with mild vascular congestion. No pneumothorax or visible effusion. No acute osseous or soft tissue abnormality. Degenerative changes are present in the imaged spine and shoulders. Telemetry leads overlie the chest. IMPRESSION: 1. Cardiomegaly and mild vascular congestion. 2. Streaky opacities in the lung bases, favor atelectasis and/or mild edema. 3.  Aortic Atherosclerosis (ICD10-I70.0). Electronically Signed   By: Lovena Le M.D.   On: 08/27/2019 23:15   ECHOCARDIOGRAM COMPLETE  Result Date: 08/28/2019    ECHOCARDIOGRAM REPORT   Patient Name:   [name] Date of Exam: 08/28/2019 Medical Rec #:  [id number]         Height:       72.0 in Accession #:    [id number]        Weight:       220.2 lb Date of Birth:  06/15/1933          BSA:          2.220 m Patient Age:    86 years          BP:           131/73 mmHg Patient Gender: M                 HR:           80 bpm. Exam Location:  Inpatient Procedure: 2D Echo, Cardiac Doppler and Color Doppler Indications:     Stroke  History:         Patient has prior history of Echocardiogram examinations, most                  recent 12/11/2018. CHF, Arrythmias:Atrial Fibrillation; Risk                  Factors:Diabetes, Hypertension, Dyslipidemia, Sleep Apnea and                   Former Smoker. CKD.  Sonographer:     Clayton Lefort RDCS (AE) Referring Phys:  [id number] Lanice Schwab AROOR Diagnosing Phys: Fransico Him MD  Sonographer Comments: Limited patient mobility. IMPRESSIONS  1. Left ventricular ejection fraction, by estimation, is 60 to 65%. The left ventricle has normal function. The left ventricle has no regional wall motion abnormalities. There is moderate concentric left ventricular hypertrophy with severe basal septal hypertrophy. Left ventricular diastolic function could not be evaluated.  2. Right ventricular systolic function is normal. The right ventricular size is normal. Tricuspid regurgitation signal is inadequate for assessing PA pressure.  3. Left atrial size was mildly dilated.  4. The mitral valve is normal in structure. Trivial mitral valve regurgitation. No evidence of mitral stenosis.  5. The aortic valve is tricuspid. Aortic valve regurgitation is not visualized. Mild aortic valve sclerosis is present, with no evidence of aortic valve stenosis.  6. Aortic dilatation noted. There is mild dilatation of the aortic root measuring 39 mm.  7. The inferior vena cava is dilated in size with <50% respiratory variability, suggesting right atrial pressure of 15 mmHg.  8. Right atrial size was mildly dilated. FINDINGS  Left Ventricle: Left ventricular ejection fraction, by estimation, is 55  to 60%. The left ventricle has normal function. The left ventricle has no regional wall motion abnormalities. The left ventricular internal cavity size was normal in size. There is  moderate concentric left ventricular hypertrophy. Left ventricular diastolic parameters are indeterminate. Right Ventricle: The right ventricular size is normal. No increase in right ventricular wall thickness. Right ventricular systolic function is normal. Tricuspid regurgitation signal is inadequate for assessing PA pressure. Left Atrium: Left atrial size was mildly dilated. Right Atrium: Right atrial size was mildly  dilated. Pericardium: There is no evidence of pericardial effusion. Mitral Valve: The mitral valve is normal in structure. Normal mobility of the mitral valve leaflets. Trivial mitral valve regurgitation. No evidence of mitral valve stenosis. Tricuspid Valve: The tricuspid valve is normal in structure. Tricuspid valve regurgitation is trivial. No evidence of tricuspid stenosis. Aortic Valve: The aortic valve is tricuspid. . There is severe thickening and severe calcifcation of the aortic valve. Aortic valve regurgitation is not visualized. Mild aortic valve sclerosis is present, with no evidence of aortic valve stenosis. There is severe thickening of the aortic valve. There is severe calcifcation of the aortic valve. Aortic valve mean gradient measures 1.0 mmHg. Aortic valve peak gradient measures 2.5 mmHg. Aortic valve area, by VTI measures 3.58 cm. Pulmonic Valve: The pulmonic valve was normal in structure. Pulmonic valve regurgitation is trivial. No evidence of pulmonic stenosis. Aorta: Aortic dilatation noted. There is mild dilatation of the aortic root measuring 39 mm. Venous: The inferior vena cava is dilated in size with less than 50% respiratory variability, suggesting right atrial pressure of 15 mmHg. IAS/Shunts: The interatrial septum appears to be lipomatous. No atrial level shunt detected by color flow Doppler.  LEFT VENTRICLE PLAX 2D LVIDd:         4.40 cm LVIDs:         3.50 cm LV PW:         1.50 cm LV IVS:        1.90 cm LVOT diam:     2.50 cm LV SV:         49 LV SV Index:   22 LVOT Area:     4.91 cm  IVC IVC diam: 2.20 cm LEFT ATRIUM             Index       RIGHT ATRIUM           Index LA diam:        4.70 cm 2.12 cm/m  RA Area:     27.10 cm LA Vol (A2C):   67.7 ml 30.50 ml/m RA Volume:   81.50 ml  36.72 ml/m LA Vol (A4C):   78.9 ml 35.55 ml/m LA Biplane Vol: 76.9 ml 34.64 ml/m  AORTIC VALVE AV Area (Vmax):    3.66 cm AV Area (Vmean):   3.58 cm AV Area (VTI):     3.58 cm AV Vmax:            79.76 cm/s AV Vmean:          53.960 cm/s AV VTI:            0.138 m AV Peak Grad:      2.5 mmHg AV Mean Grad:      1.0 mmHg LVOT Vmax:         59.48 cm/s LVOT Vmean:        39.380 cm/s LVOT VTI:          0.101 m LVOT/AV VTI ratio: 0.73  AORTA Ao Root diam: 3.90 cm Ao Asc diam:  3.20 cm  SHUNTS Systemic VTI:  0.10 m Systemic Diam: 2.50 cm Fransico Him MD Electronically signed by Fransico Him MD Signature Date/Time: 08/28/2019/7:32:40 PM    Final (Updated)    CT HEAD CODE STROKE WO CONTRAST`  Result Date: 08/27/2019 CLINICAL DATA:  Code stroke.  Acute neuro deficit. EXAM: CT HEAD WITHOUT CONTRAST TECHNIQUE: Contiguous axial images were obtained from the base of the skull through the vertex without intravenous contrast. COMPARISON:  CT head 08/11/2018 FINDINGS: Brain: Motion degraded study with repeat images Generalized atrophy. Prominent calcification in the globus pallidus bilaterally unchanged. Negative for acute infarct, hemorrhage, mass Vascular: Negative for hyperdense vessel Skull: Negative Sinuses/Orbits: Paranasal sinuses clear. Bilateral cataract extraction. Other: None ASPECTS ([place] Stroke Program Early CT Score) - Ganglionic level infarction (caudate, lentiform nuclei, internal capsule, insula, M1-M3 cortex): 7 - Supraganglionic infarction (M4-M6 cortex): 3 Total score (0-10 with 10 being normal): 10 IMPRESSION: 1. No acute abnormality.  Generalized atrophy 2. ASPECTS is 10 3. These results were called by telephone at the time of interpretation on 08/27/2019 at 7:09 pm to provider [facility] , who verbally acknowledged these results. Electronically Signed   By: Franchot Gallo M.D.   On: 08/27/2019 19:10   CT ANGIO HEAD CODE STROKE  Result Date: 08/27/2019 CLINICAL DATA:  Stroke.  Right-sided weakness. EXAM: CT ANGIOGRAPHY HEAD AND NECK CT PERFUSION BRAIN TECHNIQUE: Multidetector CT imaging of the head and neck was performed using the standard protocol during bolus administration of intravenous  contrast. Multiplanar CT image reconstructions and MIPs were obtained to evaluate the vascular anatomy. Carotid stenosis measurements (when applicable) are obtained utilizing NASCET criteria, using the distal internal carotid diameter as the denominator. Multiphase CT imaging of the brain was performed following IV bolus contrast injection. Subsequent parametric perfusion maps were calculated using RAPID software. CONTRAST:  134mL OMNIPAQUE IOHEXOL 350 MG/ML SOLN COMPARISON:  CT head 08/27/2019 FINDINGS: CTA NECK FINDINGS Aortic arch: Mild atherosclerotic disease in the aortic arch. Bovine branching pattern. Proximal great vessels patent without stenosis. Right carotid system: Right carotid widely patent without stenosis or atherosclerotic disease. Motion degrades image quality. Left carotid system: Left carotid widely patent without stenosis or atherosclerotic disease. Vertebral arteries: Left vertebral artery is dominant and patent to the basilar. Mild stenosis at the origin. Right vertebral artery is occluded at the origin and reconstitutes at the C3-4 level as a small vessel which appears to be patent to the basilar. Skeleton: Cervical spondylosis moderate in degree. No acute skeletal abnormality. Other neck: Negative for mass or adenopathy. Upper chest: Lung apices clear bilaterally. Review of the MIP images confirms the above findings CTA HEAD FINDINGS Anterior circulation: Occlusion left M1 segment appears acute. There is opacification of the left M2 and M3 branches due to collateral circulation. Mild atherosclerotic calcification right cavernous carotid without significant stenosis. Left cavernous carotid widely patent. Atherosclerotic irregularity in the anterior cerebral arteries bilaterally which is relatively severe. Atherosclerotic irregularity in right middle cerebral artery branches without large vessel occlusion. Posterior circulation: Left vertebral artery is patent. There appears to be a severe  stenosis or possibly segmental occlusion of the V4 segment on the right. Basilar is patent. Superior cerebellar and posterior cerebral arteries patent bilaterally. Venous sinuses: Normal venous enhancement. Anatomic variants: None Review of the MIP images confirms the above findings CT Brain Perfusion Findings: ASPECTS: 10 CBF (<30%) Volume: 43mL Perfusion (Tmax>6.0s) volume: 35mL Mismatch Volume: 22mL Infarction Location:Left MCA territory IMPRESSION: 1. CT  perfusion demonstrates 98 mL of delayed perfusion left MCA territory without core infarct. Aspects 10. 2. Occlusion left M1 segment which appears acute. There is collateral circulation to left MCA branches. 3. Carotid artery in the neck is normal bilaterally 4. Left vertebral artery is patent without significant stenosis 5. Occlusion of the proximal half of the right vertebral artery with additional high-grade stenosis or occlusion right V4 segment. 6. Intracranial atherosclerotic disease. 7. These results were called by telephone at the time of interpretation on 08/27/2019 at 7:50 Pm to provider [facility] , who verbally acknowledged these results. Electronically Signed   By: Franchot Gallo M.D.   On: 08/27/2019 20:01   CT ANGIO NECK CODE STROKE  Result Date: 08/27/2019 CLINICAL DATA:  Stroke.  Right-sided weakness. EXAM: CT ANGIOGRAPHY HEAD AND NECK CT PERFUSION BRAIN TECHNIQUE: Multidetector CT imaging of the head and neck was performed using the standard protocol during bolus administration of intravenous contrast. Multiplanar CT image reconstructions and MIPs were obtained to evaluate the vascular anatomy. Carotid stenosis measurements (when applicable) are obtained utilizing NASCET criteria, using the distal internal carotid diameter as the denominator. Multiphase CT imaging of the brain was performed following IV bolus contrast injection. Subsequent parametric perfusion maps were calculated using RAPID software. CONTRAST:  151mL OMNIPAQUE IOHEXOL 350  MG/ML SOLN COMPARISON:  CT head 08/27/2019 FINDINGS: CTA NECK FINDINGS Aortic arch: Mild atherosclerotic disease in the aortic arch. Bovine branching pattern. Proximal great vessels patent without stenosis. Right carotid system: Right carotid widely patent without stenosis or atherosclerotic disease. Motion degrades image quality. Left carotid system: Left carotid widely patent without stenosis or atherosclerotic disease. Vertebral arteries: Left vertebral artery is dominant and patent to the basilar. Mild stenosis at the origin. Right vertebral artery is occluded at the origin and reconstitutes at the C3-4 level as a small vessel which appears to be patent to the basilar. Skeleton: Cervical spondylosis moderate in degree. No acute skeletal abnormality. Other neck: Negative for mass or adenopathy. Upper chest: Lung apices clear bilaterally. Review of the MIP images confirms the above findings CTA HEAD FINDINGS Anterior circulation: Occlusion left M1 segment appears acute. There is opacification of the left M2 and M3 branches due to collateral circulation. Mild atherosclerotic calcification right cavernous carotid without significant stenosis. Left cavernous carotid widely patent. Atherosclerotic irregularity in the anterior cerebral arteries bilaterally which is relatively severe. Atherosclerotic irregularity in right middle cerebral artery branches without large vessel occlusion. Posterior circulation: Left vertebral artery is patent. There appears to be a severe stenosis or possibly segmental occlusion of the V4 segment on the right. Basilar is patent. Superior cerebellar and posterior cerebral arteries patent bilaterally. Venous sinuses: Normal venous enhancement. Anatomic variants: None Review of the MIP images confirms the above findings CT Brain Perfusion Findings: ASPECTS: 10 CBF (<30%) Volume: 84mL Perfusion (Tmax>6.0s) volume: 25mL Mismatch Volume: 84mL Infarction Location:Left MCA territory IMPRESSION: 1.  CT perfusion demonstrates 98 mL of delayed perfusion left MCA territory without core infarct. Aspects 10. 2. Occlusion left M1 segment which appears acute. There is collateral circulation to left MCA branches. 3. Carotid artery in the neck is normal bilaterally 4. Left vertebral artery is patent without significant stenosis 5. Occlusion of the proximal half of the right vertebral artery with additional high-grade stenosis or occlusion right V4 segment. 6. Intracranial atherosclerotic disease. 7. These results were called by telephone at the time of interpretation on 08/27/2019 at 7:50 Pm to provider [facility] , who verbally acknowledged these results. Electronically Signed  By: Franchot Gallo M.D.   On: 08/27/2019 20:01    Labs:  CBC: Recent Labs    08/27/19 1913 08/28/19 1258 08/29/19 0323 08/30/19 0328  WBC 6.0 6.4 5.0 5.7  HGB 11.2* 11.2* 10.5* 11.8*  HCT 37.1* 37.5* 35.6* 39.2  PLT 199 192 190 200    COAGS: Recent Labs    12/10/18 1841 07/04/19 0358 08/27/19 1913  INR 1.1 1.1 1.0  APTT 30  --  29    BMP: Recent Labs    08/27/19 1913 08/28/19 0628 08/29/19 0323 08/30/19 0328  NA 140 137 140 140  K 4.7 4.9 4.4 4.4  CL 107 107 106 105  CO2 25 20* 26 25  GLUCOSE 140* 161* 120* 134*  BUN 27* 18 18 21   CALCIUM 9.0 8.8* 8.8* 9.2  CREATININE 1.89* 1.58* 1.71* 1.63*  GFRNONAA 32* 40* 36* 38*  GFRAA 37* 46* 42* 44*    LIVER FUNCTION TESTS: Recent Labs    06/30/19 1620 07/01/19 0456 07/19/19 1052 08/27/19 1913  BILITOT 0.9 0.5 0.5 0.8  AST 24 21 16 31   ALT 35 31 21 25   ALKPHOS 112 105 115 109  PROT 7.9 7.4 7.4 7.2  ALBUMIN 3.5 3.3* 3.3* 3.5    Assessment and Plan:  CVA s/p cerebral arteriogram with emergent mechanical thrombectomy of proximal left MCA M1 occlusion achieving a TICI 3 revascularization via right femoral approach 08/27/2019 by Dr. Karenann Cai. Patient's condition stable- no focal neurologic symptoms noted, can spontaneously move all  extremities. Right groin puncture site stable, distal pulses (DPs) 1+ bilaterally. Plan to follow-up with Dr. Karenann Cai in clinic 1 month after discharge (our schedulers to call patient to set up this appointment). Further plans per neurology- appreciate and agree with management. Please call NIR with questions/concerns.   Electronically Signed: Earley Abide, PA-C 08/30/2019, 11:06 AM   I spent a total of 25 Minutes at the the patient's bedside AND on the patient's hospital floor or unit, greater than 50% of which was counseling/coordinating care for CVA s/p revascularization.
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
## 2019-08-30 NOTE — Consult Note (Signed)
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 radiographic images. I have reviewed and concur with the physician assistant's documentation above. Karie Fetch Angiulli, PA-C 08/30/2019

## 2019-08-30 NOTE — Progress Notes (Signed)
Inpatient Rehab Admissions Coordinator:   Met with pt at bedside and spoke to wife over the phone.  She would prefer a rehab facility closer to her home, as she does not have transportation to [place].  I let TOC team know.  Will sign off for CIR at this time.   Shann Medal, PT, DPT Admissions Coordinator [phone] 08/30/19  12:25 PM

## 2019-08-30 NOTE — Discharge Instructions (Addendum)
Femoral Site Care °This sheet gives you information about how to care for yourself after your procedure. Your health care provider may also give you more specific instructions. If you have problems or questions, contact your health care provider. °What can I expect after the procedure? °After the procedure, it is common to have: °· Bruising that usually fades within 1-2 weeks. °· Tenderness at the site. °Follow these instructions at home: °Wound care °1. Follow instructions from your health care provider about how to take care of your insertion site. Make sure you: °? Wash your hands with soap and water before you change your bandage (dressing). If soap and water are not available, use hand sanitizer. °? Change your dressing as directed- pressure dressing removed 24 hours post-procedure (and switch for bandaid), bandaid removed 72 hours post-procedure °2. Do not take baths, swim, or use a hot tub for 7 days post-procedure. °3. You may shower 48 hours after the procedure or as told by your health care provider. °? Gently wash the site with plain soap and water. °? Pat the area dry with a clean towel. °? Do not rub the site. This may cause bleeding. °4. Check your femoral site every day for signs of infection. Check for: °? Redness, swelling, or pain. °? Fluid or blood. °? Warmth. °? Pus or a bad smell. °Activity °· Do not stoop, bend, or lift anything that is heavier than 10 lb (4.5 kg) for 2 weeks post-procedure. °· Do not drive self for 2 weeks post-procedure. °Contact a health care provider if you have: °· A fever or chills. °· You have redness, swelling, or pain around your insertion site. °Get help right away if: °· The catheter insertion area swells very fast. °· You pass out. °· You suddenly start to sweat or your skin gets clammy. °· The catheter insertion area is bleeding, and the bleeding does not stop when you hold steady pressure on the area. °· The area near or just beyond the catheter insertion site  becomes pale, cool, tingly, or numb. °These symptoms may represent a serious problem that is an emergency. Do not wait to see if the symptoms will go away. Get medical help right away. Call your local emergency services (911 in the [place] Do not drive yourself to the hospital. ° °This information is not intended to replace advice given to you by your health care provider. Make sure you discuss any questions you have with your health care provider. °Document Revised: 01/20/2017 Document Reviewed: 01/20/2017 °Elsevier Patient Education © 2020 [employer]vier Inc. °

## 2019-08-31 DIAGNOSIS — E1169 Type 2 diabetes mellitus with other specified complication: Secondary | ICD-10-CM

## 2019-08-31 LAB — BASIC METABOLIC PANEL
Anion gap: 10 (ref 5–15)
BUN: 24 mg/dL — ABNORMAL HIGH (ref 8–23)
CO2: 25 mmol/L (ref 22–32)
Calcium: 9.1 mg/dL (ref 8.9–10.3)
Chloride: 103 mmol/L (ref 98–111)
Creatinine, Ser: 1.84 mg/dL — ABNORMAL HIGH (ref 0.61–1.24)
GFR calc Af Amer: 38 mL/min — ABNORMAL LOW (ref >60)
GFR calc non Af Amer: 33 mL/min — ABNORMAL LOW (ref >60)
Glucose, Bld: 137 mg/dL — ABNORMAL HIGH (ref 70–99)
Potassium: 4.1 mmol/L (ref 3.5–5.1)
Sodium: 138 mmol/L (ref 135–145)

## 2019-08-31 LAB — GLUCOSE, CAPILLARY
Glucose-Capillary: 135 mg/dL — ABNORMAL HIGH (ref 70–99)
Glucose-Capillary: 113 mg/dL — ABNORMAL HIGH (ref 70–99)
Glucose-Capillary: 82 mg/dL (ref 70–99)

## 2019-08-31 LAB — CBC
HCT: 37.5 % — ABNORMAL LOW (ref 39.0–52.0)
Hemoglobin: 11.5 g/dL — ABNORMAL LOW (ref 13.0–17.0)
MCH: 28.7 pg (ref 26.0–34.0)
MCHC: 30.7 g/dL (ref 30.0–36.0)
MCV: 93.5 fL (ref 80.0–100.0)
Platelets: 192 10*3/uL (ref 150–400)
RBC: 4.01 MIL/uL — ABNORMAL LOW (ref 4.22–5.81)
RDW: 15.2 % (ref 11.5–15.5)
WBC: 4.6 10*3/uL (ref 4.0–10.5)
nRBC: 0 % (ref 0.0–0.2)

## 2019-08-31 MED ORDER — SENNOSIDES-DOCUSATE SODIUM 8.6-50 MG PO TABS: 1.0000 | ORAL_TABLET | Freq: Every evening | ORAL | 0 refills | Status: DC | PRN

## 2019-08-31 MED ORDER — ATORVASTATIN CALCIUM 10 MG PO TABS: 10.0000 mg | ORAL_TABLET | Freq: Every day | ORAL | 0 refills | Status: DC

## 2019-08-31 MED ORDER — PANTOPRAZOLE SODIUM 40 MG PO TBEC: 40.0000 mg | DELAYED_RELEASE_TABLET | Freq: Every day | ORAL | 1 refills | Status: DC

## 2019-08-31 MED ORDER — METOPROLOL TARTRATE 25 MG PO TABS: 25.0000 mg | ORAL_TABLET | Freq: Two times a day (BID) | ORAL | 1 refills | Status: DC

## 2019-08-31 MED ORDER — ASPIRIN 325 MG PO TBEC: 325.0000 mg | DELAYED_RELEASE_TABLET | Freq: Every day | ORAL | 0 refills | Status: DC
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
## 2019-08-31 NOTE — TOC Transition Note (Signed)
Transition of Care [facility]) - CM/SW Discharge Note   Patient Details  Name: [name] MRN: [id number] Date of Birth: 27-Apr-1933  [facility]) CM/SW Contact:  Pollie Friar, RN Phone Number: 08/31/2019, 1:41 PM   Clinical Narrative:    Pt discharging home with spouse and [facility] services through [place]. Cory with [facility] aware of d/c.  3 in 1 and walker delivered to the room per [employer].  Wife has transportation arranged for 4:30. Bedside Rn updated.   Final next level of care: [place] Barriers to Discharge: No Barriers Identified   Patient Goals and CMS Choice Patient states their goals for this hospitalization and ongoing recovery are:: To get closer to home CMS Medicare.gov Compare Post Acute Care list provided to:: Patient Represenative (must comment) Choice offered to / list presented to : Spouse  Discharge Placement                       Discharge Plan and Services In-house Referral: Clinical Social Work Discharge Planning Services: CM Consult Post Acute Care Choice: [place]          DME Arranged: 3-N-1, Walker rolling DME Agency: [employer] Date DME Agency Contacted: 08/31/19   Representative spoke with at DME Agency: [place]: PT, OT [place] Agency: [place] Date [place]: 08/31/19   Representative spoke with at [place]: [place] ([place]) Interventions     Readmission Risk Interventions Readmission Risk Prevention Plan 08/30/2019  Transportation Screening Complete  PCP or Specialist Appt within 3-5 Days Not Complete  Not Complete comments Plan for SNF  [place] or [place] Complete  Social Work Consult for [place] Planning/Counseling Complete  Palliative Care Screening Not Applicable  Medication Review Press photographer) Referral to Pharmacy  Some recent data might be hidden
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
## 2019-08-31 NOTE — Anesthesia Postprocedure Evaluation (Signed)
Anesthesia Post Note  Patient: [name]  Procedure(s) Performed: IR WITH ANESTHESIA (N/A )     Patient location during evaluation: ICU Anesthesia Type: General Level of consciousness: awake Pain management: pain level controlled Vital Signs Assessment: post-procedure vital signs reviewed and stable Respiratory status: spontaneous breathing, nonlabored ventilation, respiratory function stable and patient connected to nasal cannula oxygen Cardiovascular status: stable Postop Assessment: no apparent nausea or vomiting Anesthetic complications: no   No complications documented.  Last Vitals:  Vitals:   08/31/19 0332 08/31/19 0732  BP: (!) 146/94 (!) 132/93  Pulse: 71 75  Resp:  (!) 22  Temp: 36.6 C 36.9 C  SpO2: 99% 99%    Last Pain:  Vitals:   08/31/19 0732  TempSrc:   PainSc: 0-No pain                 Audry Pili

## 2019-08-31 NOTE — Discharge Summary (Addendum)
Stroke Discharge Summary  Patient ID: [name]   MRN: [id number]      DOB: April 04, 1933  Date of Admission: 08/27/2019 Date of Discharge: 08/31/2019  Attending Physician:  Rosalin Hawking, MD, Stroke MD Consultant(s):   Physical Medicine Patient's PCP:  Ronnell Freshwater, NP  DISCHARGE DIAGNOSIS:  Active Problems:   Acute ischemic left MCA stroke ([place])   PAF   Recurrent rectal bleeding   HTN   HLD   DM   Allergies as of 08/31/2019   No Known Allergies     Medication List    TAKE these medications   albuterol 108 (90 Base) MCG/ACT inhaler Commonly known as: VENTOLIN HFA Inhale 2 puffs into the lungs every 4 (four) hours as needed for wheezing or shortness of breath.   aspirin 325 MG EC tablet Take 1 tablet (325 mg total) by mouth daily. Start taking on: September 01, 2019   atorvastatin 10 MG tablet Commonly known as: LIPITOR Take 1 tablet (10 mg total) by mouth daily. Start taking on: September 01, 2019 What changed:   medication strength  how much to take  when to take this   ferrous sulfate 325 (65 FE) MG tablet Take 325 mg by mouth daily.   metoprolol tartrate 25 MG tablet Commonly known as: LOPRESSOR Take 1 tablet (25 mg total) by mouth 2 (two) times daily.   OneTouch Verio test strip Generic drug: glucose blood USE ONCE DAILY. DX E11.65.   pantoprazole 40 MG tablet Commonly known as: PROTONIX Take 1 tablet (40 mg total) by mouth daily. Start taking on: September 01, 2019 What changed:   how much to take  how to take this  when to take this  additional instructions   senna-docusate 8.6-50 MG tablet Commonly known as: Senokot-S Take 1 tablet by mouth at bedtime as needed for mild constipation.   sitaGLIPtin 25 MG tablet Commonly known as: Januvia Take 1 tablet (25 mg total) by mouth daily.            Durable Medical Equipment  (From admission, onward)         Start     Ordered   08/30/19 1418  For home use only DME 3 n 1  Once         08/30/19 1417   08/30/19 1418  For home use only DME Walker rolling  Once       Question Answer Comment  Walker: With 5 Inch Wheels   Patient needs a walker to treat with the following condition Stroke ([name])      08/30/19 1417          LABORATORY STUDIES CBC    Component Value Date/Time   WBC 4.6 08/31/2019 0301   RBC 4.01 (L) 08/31/2019 0301   HGB 11.5 (L) 08/31/2019 0301   HGB 14.6 06/11/2019 0953   HCT 37.5 (L) 08/31/2019 0301   HCT 46.0 06/11/2019 0953   PLT 192 08/31/2019 0301   PLT 154 06/11/2019 0953   MCV 93.5 08/31/2019 0301   MCV 94 06/11/2019 0953   MCV 92 10/13/2013 0415   MCH 28.7 08/31/2019 0301   MCHC 30.7 08/31/2019 0301   RDW 15.2 08/31/2019 0301   RDW 13.3 06/11/2019 0953   RDW 15.3 (H) 10/13/2013 0415   LYMPHSABS 1.8 08/27/2019 1913   LYMPHSABS 2.4 10/13/2013 0415   MONOABS 1.1 (H) 08/27/2019 1913   MONOABS 0.8 10/13/2013 0415   EOSABS 0.3 08/27/2019 1913  EOSABS 0.2 10/13/2013 0415   BASOSABS 0.0 08/27/2019 1913   BASOSABS 0.0 10/13/2013 0415   CMP    Component Value Date/Time   NA 138 08/31/2019 0301   NA 144 06/11/2019 0953   NA 138 10/13/2013 0415   K 4.1 08/31/2019 0301   K 4.0 10/13/2013 0415   CL 103 08/31/2019 0301   CL 106 10/13/2013 0415   CO2 25 08/31/2019 0301   CO2 27 10/13/2013 0415   GLUCOSE 137 (H) 08/31/2019 0301   GLUCOSE 97 10/13/2013 0415   BUN 24 (H) 08/31/2019 0301   BUN 37 (H) 06/11/2019 0953   BUN 22 (H) 10/13/2013 0415   CREATININE 1.84 (H) 08/31/2019 0301   CREATININE 1.65 (H) 10/13/2013 0415   CALCIUM 9.1 08/31/2019 0301   CALCIUM 8.2 (L) 10/13/2013 0415   PROT 7.2 08/27/2019 1913   PROT 7.3 06/11/2019 0953   PROT 7.4 10/12/2013 0732   ALBUMIN 3.5 08/27/2019 1913   ALBUMIN 4.4 06/11/2019 0953   ALBUMIN 3.0 (L) 10/12/2013 0732   AST 31 08/27/2019 1913   AST 22 10/12/2013 0732   ALT 25 08/27/2019 1913   ALT 29 10/12/2013 0732   ALKPHOS 109 08/27/2019 1913   ALKPHOS 128 (H) 10/12/2013 0732    BILITOT 0.8 08/27/2019 1913   BILITOT 0.6 06/11/2019 0953   BILITOT 0.3 10/12/2013 0732   GFRNONAA 33 (L) 08/31/2019 0301   GFRNONAA 39 (L) 10/13/2013 0415   GFRAA 38 (L) 08/31/2019 0301   GFRAA 45 (L) 10/13/2013 0415   COAGS Lab Results  Component Value Date   INR 1.0 08/27/2019   INR 1.1 07/04/2019   INR 1.1 12/10/2018   Lipid Panel    Component Value Date/Time   CHOL 78 08/28/2019 0628   CHOL 97 (L) 06/11/2019 0953   CHOL 140 10/12/2013 0732   TRIG 44 08/28/2019 0628   TRIG 139 10/12/2013 0732   HDL 42 08/28/2019 0628   HDL 47 06/11/2019 0953   HDL 43 10/12/2013 0732   CHOLHDL 1.9 08/28/2019 0628   VLDL 9 08/28/2019 0628   VLDL 28 10/12/2013 0732   LDLCALC 27 08/28/2019 0628   LDLCALC 34 06/11/2019 0953   LDLCALC 69 10/12/2013 0732   HgbA1C  Lab Results  Component Value Date   HGBA1C 6.4 (H) 08/28/2019   Urinalysis    Component Value Date/Time   COLORURINE YELLOW (A) 07/01/2019 0506   APPEARANCEUR CLEAR (A) 07/01/2019 0506   APPEARANCEUR Clear 07/23/2018 0833   LABSPEC 1.016 07/01/2019 0506   LABSPEC 1.015 10/12/2013 1445   PHURINE 5.0 07/01/2019 0506   GLUCOSEU NEGATIVE 07/01/2019 0506   GLUCOSEU 50 mg/dL 10/12/2013 1445   HGBUR NEGATIVE 07/01/2019 0506   BILIRUBINUR NEGATIVE 07/01/2019 0506   BILIRUBINUR negative 06/29/2019 1556   BILIRUBINUR Negative 07/23/2018 0833   BILIRUBINUR Negative 10/12/2013 1445   KETONESUR NEGATIVE 07/01/2019 0506   PROTEINUR NEGATIVE 07/01/2019 0506   UROBILINOGEN 0.2 06/29/2019 1556   NITRITE NEGATIVE 07/01/2019 0506   LEUKOCYTESUR NEGATIVE 07/01/2019 0506   LEUKOCYTESUR Negative 10/12/2013 1445   Urine Drug Screen     Component Value Date/Time   LABOPIA NEGATIVE 06/11/2013 2253   COCAINSCRNUR NEGATIVE 06/11/2013 2253   LABBENZ NEGATIVE 06/11/2013 2253   AMPHETMU NEGATIVE 06/11/2013 2253   THCU NEGATIVE 06/11/2013 2253   LABBARB NEGATIVE 06/11/2013 2253    Alcohol Level    Component Value Date/Time   ETH <10  08/27/2019 1913     SIGNIFICANT DIAGNOSTIC STUDIES CT CERVICAL SPINE WO CONTRAST  Result Date: 08/28/2019 CLINICAL DATA:  Head trauma, minor.  Head trauma. EXAM: CT CERVICAL SPINE WITHOUT CONTRAST TECHNIQUE: Multidetector CT imaging of the cervical spine was performed without intravenous contrast. Multiplanar CT image reconstructions were also generated. COMPARISON:  CTA neck 08/27/2018 FINDINGS: Alignment: Straightening of the expected cervical lordosis. No significant spondylolisthesis Skull base and vertebrae: The basion-dental and atlanto-dental intervals are maintained.No evidence of acute fracture to the cervical spine. Soft tissues and spinal canal: No prevertebral fluid or swelling. No visible canal hematoma. Disc levels: Cervical spondylosis with multilevel disc space narrowing, posterior disc osteophytes, uncovertebral and facet hypertrophy. No high-grade bony spinal canal stenosis. Multilevel bony neural foraminal narrowing. Upper chest: No consolidation within the imaged lung apices. No visible pneumothorax. Other: Nonspecific low-density 9 mm cutaneous/subcutaneous lesion within the midline posterior neck at the C4-C5 level, which is nonspecific but may reflect a cyst IMPRESSION: No evidence of acute fracture to the cervical spine. Cervical spondylosis as described. Nonspecific low-density 9 mm cutaneous/subcutaneous lesion within the midline posterior neck at the C4-C5 level, which is nonspecific but may reflect a cyst. Electronically Signed   By: Kellie Simmering DO   On: 08/28/2019 10:40   MR ANGIO HEAD WO CONTRAST  Result Date: 08/28/2019 CLINICAL DATA:  Stroke, follow-up. EXAM: MRA HEAD WITHOUT CONTRAST TECHNIQUE: Angiographic images of the Circle of Willis were obtained using MRA technique without intravenous contrast. COMPARISON:  Noncontrast CT head and CT angiogram head/neck performed 08/27/2019. FINDINGS: The examination is mild to moderately motion degraded which limits evaluation for  stenoses and for small aneurysms. The intracranial internal carotid arteries are patent. The revascularized M1 left middle cerebral artery remains patent. The M1 right middle cerebral artery is patent. No M2 proximal branch occlusion is identified. The anterior cerebral arteries are patent. Due to the degree of motion degradation, relatively severe multifocal atherosclerotic irregularity of both anterior cerebral arteries was better appreciated on the prior CTA of 08/27/2019. The proximal V4 right vertebral artery is poorly delineated and may be severely stenosed or partially occluded. Some flow related signal is seen within the distal right vertebral artery. The dominant left vertebral artery is patent without significant stenosis, as is the basilar artery. The posterior cerebral arteries are patent. Apparent moderate/severe stenosis of the P2 posterior cerebral artery which may be accentuated by motion artifact. Apparent severe focal stenosis within the proximal left PICA (series 355, image 5). No intracranial aneurysm is identified. IMPRESSION: Moderately motion-degraded examination, limiting evaluation for stenoses and small aneurysm. The revascularized M1 left middle cerebral artery remains patent. Poorly delineated proximal V4 right vertebral artery which, as before, is likely severely stenosed or partially occluded. Due to the degree of motion degradation, fairly severe multifocal atherosclerotic irregularity of the anterior cerebral arteries was better appreciated on the prior CTA of 08/27/2019. Apparent moderate/severe stenosis of the P2 left posterior cerebral artery, which may be accentuated by motion artifact on the current study. Apparent severe focal stenosis within the proximal left PICA. Electronically Signed   By: Kellie Simmering DO   On: 08/28/2019 11:46   MR BRAIN WO CONTRAST  Result Date: 08/28/2019 CLINICAL DATA:  Stroke, follow-up. EXAM: MRI HEAD WITHOUT CONTRAST TECHNIQUE: Multiplanar,  multiecho pulse sequences of the brain and surrounding structures were obtained without intravenous contrast. COMPARISON:  Noncontrast head CT, CT angiogram head/neck and CT perfusion 08/27/2019. FINDINGS: Brain: Multiple sequences are significantly motion degraded, limiting evaluation. Most notably there is moderate/severe motion degradation of the coronal diffusion-weighted sequence, moderate/severe motion degradation of the sagittal T1  weighted sequence, moderate motion degradation of the axial T2 weighted sequence, moderate/severe motion degradation of the axial T2/FLAIR sequence, moderate/severe motion degradation of the axial T1 weighted sequence and severe motion degradation of the coronal T2 weighted sequence. Moderate generalized parenchymal atrophy There is an acute infarct within the left corona radiata/lentiform nucleus measuring 1.8 x 1.0 x 2.7 cm (series 2, image 23) (series 4, image 18). Corresponding T2/FLAIR hyperintensity at this site. There are additional punctate acute infarcts within the left caudate head. Additionally, there is a subcentimeter focus of SWI signal loss in this region suspicious for petechial hemorrhage. Incompletely assessed background scattered T2/FLAIR hyperintensity within the cerebral white matter which is nonspecific, but consistent with chronic small vessel ischemic disease. No extra-axial fluid collection. No midline shift. Vascular: No definite loss of proximal arterial flow voids. Skull and upper cervical spine: No focal marrow lesion identified within limitations of motion degradation Sinuses/Orbits: Visualized orbits show no acute finding. Mild ethmoid sinus mucosal thickening. No significant mastoid effusion IMPRESSION: Significantly motion degraded and limited examination as described. 2.7 cm acute infarct within the left corona radiata/lentiform nucleus. There is a subcentimeter focus of SWI signal loss at this site suspicious for petechial hemorrhage. Additional  punctate acute infarcts within the left caudate head. Background generalized parenchymal atrophy and chronic small vessel ischemic disease. Mild ethmoid sinus mucosal thickening. Electronically Signed   By: Kellie Simmering DO   On: 08/28/2019 11:32   CT CEREBRAL PERFUSION W CONTRAST  Result Date: 08/27/2019 CLINICAL DATA:  Stroke.  Right-sided weakness. EXAM: CT ANGIOGRAPHY HEAD AND NECK CT PERFUSION BRAIN TECHNIQUE: Multidetector CT imaging of the head and neck was performed using the standard protocol during bolus administration of intravenous contrast. Multiplanar CT image reconstructions and MIPs were obtained to evaluate the vascular anatomy. Carotid stenosis measurements (when applicable) are obtained utilizing NASCET criteria, using the distal internal carotid diameter as the denominator. Multiphase CT imaging of the brain was performed following IV bolus contrast injection. Subsequent parametric perfusion maps were calculated using RAPID software. CONTRAST:  146mL OMNIPAQUE IOHEXOL 350 MG/ML SOLN COMPARISON:  CT head 08/27/2019 FINDINGS: CTA NECK FINDINGS Aortic arch: Mild atherosclerotic disease in the aortic arch. Bovine branching pattern. Proximal great vessels patent without stenosis. Right carotid system: Right carotid widely patent without stenosis or atherosclerotic disease. Motion degrades image quality. Left carotid system: Left carotid widely patent without stenosis or atherosclerotic disease. Vertebral arteries: Left vertebral artery is dominant and patent to the basilar. Mild stenosis at the origin. Right vertebral artery is occluded at the origin and reconstitutes at the C3-4 level as a small vessel which appears to be patent to the basilar. Skeleton: Cervical spondylosis moderate in degree. No acute skeletal abnormality. Other neck: Negative for mass or adenopathy. Upper chest: Lung apices clear bilaterally. Review of the MIP images confirms the above findings CTA HEAD FINDINGS Anterior  circulation: Occlusion left M1 segment appears acute. There is opacification of the left M2 and M3 branches due to collateral circulation. Mild atherosclerotic calcification right cavernous carotid without significant stenosis. Left cavernous carotid widely patent. Atherosclerotic irregularity in the anterior cerebral arteries bilaterally which is relatively severe. Atherosclerotic irregularity in right middle cerebral artery branches without large vessel occlusion. Posterior circulation: Left vertebral artery is patent. There appears to be a severe stenosis or possibly segmental occlusion of the V4 segment on the right. Basilar is patent. Superior cerebellar and posterior cerebral arteries patent bilaterally. Venous sinuses: Normal venous enhancement. Anatomic variants: None Review of the MIP images confirms the  above findings CT Brain Perfusion Findings: ASPECTS: 10 CBF (<30%) Volume: 59mL Perfusion (Tmax>6.0s) volume: 76mL Mismatch Volume: 1mL Infarction Location:Left MCA territory IMPRESSION: 1. CT perfusion demonstrates 98 mL of delayed perfusion left MCA territory without core infarct. Aspects 10. 2. Occlusion left M1 segment which appears acute. There is collateral circulation to left MCA branches. 3. Carotid artery in the neck is normal bilaterally 4. Left vertebral artery is patent without significant stenosis 5. Occlusion of the proximal half of the right vertebral artery with additional high-grade stenosis or occlusion right V4 segment. 6. Intracranial atherosclerotic disease. 7. These results were called by telephone at the time of interpretation on 08/27/2019 at 7:50 Pm to provider [facility] , who verbally acknowledged these results. Electronically Signed   By: Franchot Gallo M.D.   On: 08/27/2019 20:01   DG Chest Port 1 View  Result Date: 08/27/2019 CLINICAL DATA:  Acute ischemic stroke EXAM: PORTABLE CHEST 1 VIEW COMPARISON:  Radiograph 06/19/2019, CT 06/20/2019 FINDINGS: Cardiomegaly similar  to comparison CT and likely accentuated by the portable technique and low lung volumes. Some streaky opacities are present in the lung bases with mild vascular congestion. No pneumothorax or visible effusion. No acute osseous or soft tissue abnormality. Degenerative changes are present in the imaged spine and shoulders. Telemetry leads overlie the chest. IMPRESSION: 1. Cardiomegaly and mild vascular congestion. 2. Streaky opacities in the lung bases, favor atelectasis and/or mild edema. 3.  Aortic Atherosclerosis (ICD10-I70.0). Electronically Signed   By: Lovena Le M.D.   On: 08/27/2019 23:15   ECHOCARDIOGRAM COMPLETE  Result Date: 08/28/2019    ECHOCARDIOGRAM REPORT   Patient Name:   SHADEED COLBERG Date of Exam: 08/28/2019 Medical Rec #:  [id number]         Height:       72.0 in Accession #:    [id number]        Weight:       220.2 lb Date of Birth:  [birth year]          BSA:          2.220 m Patient Age:    13 years          BP:           131/73 mmHg Patient Gender: M                 HR:           80 bpm. Exam Location:  Inpatient Procedure: 2D Echo, Cardiac Doppler and Color Doppler Indications:     Stroke  History:         Patient has prior history of Echocardiogram examinations, most                  recent 12/11/2018. CHF, Arrythmias:Atrial Fibrillation; Risk                  Factors:Diabetes, Hypertension, Dyslipidemia, Sleep Apnea and                  Former Smoker. CKD.  Sonographer:     Clayton Lefort RDCS (AE) Referring Phys:  [id number] Lanice Schwab AROOR Diagnosing Phys: Fransico Him MD  Sonographer Comments: Limited patient mobility. IMPRESSIONS  1. Left ventricular ejection fraction, by estimation, is 60 to 65%. The left ventricle has normal function. The left ventricle has no regional wall motion abnormalities. There is moderate concentric left ventricular hypertrophy with severe basal septal hypertrophy. Left ventricular diastolic function could not be evaluated.  2. Right ventricular systolic function  is normal. The right ventricular size is normal. Tricuspid regurgitation signal is inadequate for assessing PA pressure.  3. Left atrial size was mildly dilated.  4. The mitral valve is normal in structure. Trivial mitral valve regurgitation. No evidence of mitral stenosis.  5. The aortic valve is tricuspid. Aortic valve regurgitation is not visualized. Mild aortic valve sclerosis is present, with no evidence of aortic valve stenosis.  6. Aortic dilatation noted. There is mild dilatation of the aortic root measuring 39 mm.  7. The inferior vena cava is dilated in size with <50% respiratory variability, suggesting right atrial pressure of 15 mmHg.  8. Right atrial size was mildly dilated. FINDINGS  Left Ventricle: Left ventricular ejection fraction, by estimation, is 55 to 60%. The left ventricle has normal function. The left ventricle has no regional wall motion abnormalities. The left ventricular internal cavity size was normal in size. There is  moderate concentric left ventricular hypertrophy. Left ventricular diastolic parameters are indeterminate. Right Ventricle: The right ventricular size is normal. No increase in right ventricular wall thickness. Right ventricular systolic function is normal. Tricuspid regurgitation signal is inadequate for assessing PA pressure. Left Atrium: Left atrial size was mildly dilated. Right Atrium: Right atrial size was mildly dilated. Pericardium: There is no evidence of pericardial effusion. Mitral Valve: The mitral valve is normal in structure. Normal mobility of the mitral valve leaflets. Trivial mitral valve regurgitation. No evidence of mitral valve stenosis. Tricuspid Valve: The tricuspid valve is normal in structure. Tricuspid valve regurgitation is trivial. No evidence of tricuspid stenosis. Aortic Valve: The aortic valve is tricuspid. . There is severe thickening and severe calcifcation of the aortic valve. Aortic valve regurgitation is not visualized. Mild aortic valve  sclerosis is present, with no evidence of aortic valve stenosis. There is severe thickening of the aortic valve. There is severe calcifcation of the aortic valve. Aortic valve mean gradient measures 1.0 mmHg. Aortic valve peak gradient measures 2.5 mmHg. Aortic valve area, by VTI measures 3.58 cm. Pulmonic Valve: The pulmonic valve was normal in structure. Pulmonic valve regurgitation is trivial. No evidence of pulmonic stenosis. Aorta: Aortic dilatation noted. There is mild dilatation of the aortic root measuring 39 mm. Venous: The inferior vena cava is dilated in size with less than 50% respiratory variability, suggesting right atrial pressure of 15 mmHg. IAS/Shunts: The interatrial septum appears to be lipomatous. No atrial level shunt detected by color flow Doppler.  LEFT VENTRICLE PLAX 2D LVIDd:         4.40 cm LVIDs:         3.50 cm LV PW:         1.50 cm LV IVS:        1.90 cm LVOT diam:     2.50 cm LV SV:         49 LV SV Index:   22 LVOT Area:     4.91 cm  IVC IVC diam: 2.20 cm LEFT ATRIUM             Index       RIGHT ATRIUM           Index LA diam:        4.70 cm 2.12 cm/m  RA Area:     27.10 cm LA Vol (A2C):   67.7 ml 30.50 ml/m RA Volume:   81.50 ml  36.72 ml/m LA Vol (A4C):   78.9 ml 35.55 ml/m LA Biplane Vol: 76.9 ml 34.64  ml/m  AORTIC VALVE AV Area (Vmax):    3.66 cm AV Area (Vmean):   3.58 cm AV Area (VTI):     3.58 cm AV Vmax:           79.76 cm/s AV Vmean:          53.960 cm/s AV VTI:            0.138 m AV Peak Grad:      2.5 mmHg AV Mean Grad:      1.0 mmHg LVOT Vmax:         59.48 cm/s LVOT Vmean:        39.380 cm/s LVOT VTI:          0.101 m LVOT/AV VTI ratio: 0.73  AORTA Ao Root diam: 3.90 cm Ao Asc diam:  3.20 cm  SHUNTS Systemic VTI:  0.10 m Systemic Diam: 2.50 cm Fransico Him MD Electronically signed by Fransico Him MD Signature Date/Time: 08/28/2019/7:32:40 PM    Final (Updated)    NM PET (AXUMIN) SKULL BASE TO MID THIGH  Addendum Date: 08/23/2019   ADDENDUM REPORT: 08/23/2019  09:54 ADDENDUM: Outside MRI was made available for comparison. MRI dated 07/08/2019. On comparison MRI there is a hemorrhagic lesion partially exophytic from the RIGHT hepatic lobe of the liver. This lesion corresponds to exophytic lesion on the CT portion exam measuring 4.3 cm image 145/3). This lesion DOES NOT accumulate the prostate cancer specific radiotracer. Additional adenopathy within the pelvis along the RIGHT iliac chain is again demonstrated on MRI. Indeterminate lesion exophytic from the RIGHT hepatic lobe Electronically Signed   By: Suzy Bouchard M.D.   On: 08/23/2019 09:54   Result Date: 08/23/2019 CLINICAL DATA:  Subsequent treatment strategy for recurrent prostate cancer. Initial diagnosis 2013. Prior prostatectomy is elevated PSA EXAM: NUCLEAR MEDICINE PET SKULL BASE TO THIGH TECHNIQUE: 10.6 mCi F-18 Fluciclovine was injected intravenously. Full-ring PET imaging was performed from the skull base to thigh after the radiotracer. CT data was obtained and used for attenuation correction and anatomic localization. COMPARISON:  None. FINDINGS: NECK No radiotracer activity in neck lymph nodes. Incidental CT finding: None CHEST No radiotracer accumulation within mediastinal or hilar lymph nodes. No suspicious pulmonary nodules on the CT scan. Incidental CT finding: None ABDOMEN/PELVIS Prostate: No focal activity in the prostate bed. Lymph nodes: Intense radiotracer activity within a RIGHT external iliac lymph node measuring 1.5 cm (image 221/CT series 3) with SUV max equal 19.2. Enlarged higher RIGHT common iliac node measures 1.7 cm with SUV max equal 11.8. Higher LEFT periaortic lymph node 0.7 cm SUV max equal 9.5. This is the highest radiotracer avid lymph node and is below the renal veins. Liver: No evidence of liver metastasis Incidental CT finding: None SKELETON No focal  activity to suggest skeletal metastasis. IMPRESSION: 1. Intensely radiotracer avid RIGHT iliac adenopathy and periaortic  retroperitoneal adenopathy consistent with prostate cancer nodal metastasis. 2. No evidence of visceral metastasis or skeletal metastasis. Electronically Signed: By: Suzy Bouchard M.D. On: 08/04/2019 08:34   CT HEAD CODE STROKE WO CONTRAST`  Result Date: 08/27/2019 CLINICAL DATA:  Code stroke.  Acute neuro deficit. EXAM: CT HEAD WITHOUT CONTRAST TECHNIQUE: Contiguous axial images were obtained from the base of the skull through the vertex without intravenous contrast. COMPARISON:  CT head 08/11/2018 FINDINGS: Brain: Motion degraded study with repeat images Generalized atrophy. Prominent calcification in the globus pallidus bilaterally unchanged. Negative for acute infarct, hemorrhage, mass Vascular: Negative for hyperdense vessel Skull: Negative Sinuses/Orbits: Paranasal  sinuses clear. Bilateral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

## 2019-08-31 NOTE — Care Management Important Message (Signed)
Important Message  Patient Details  Name: SUFIAN RAVI MRN: [id number] Date of Birth: 02-14-1933   Medicare Important Message Given:  Yes  Patient left prior to IM delivery.  IM mailed to the patient address.   Verlinda Slotnick 08/31/2019, 4:15 PM

## 2019-08-31 NOTE — Progress Notes (Signed)
SLP Cancellation Note  Patient Details Name: [name] MRN: [id number] DOB: [birth year]   Cancelled treatment:       Reason Eval/Treat Not Completed: Patient declined, no reason specified Patient imminently discharging home with [facility] therapies. Continue recommendation for home health ST to address ongoing deficits.  Jru Pense P. Lavone Barrientes, M.S., [place] Pathologist Acute Rehabilitation Services Pager: [place] 08/31/2019, 12:48 PM
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
## 2019-08-31 NOTE — Consult Note (Signed)
   [facility] CM Inpatient Consult   08/31/2019  [name] 1933-02-08 [id number]   [place] [facility] [ACO] Patient: [name]   Patient screened for extreme high risk score for unplanned readmission score and for hospitalizations to check if potential [place] [facility] service needs.  Review of patient's medical record reveals patient is being recommended for a skilled nursing and CIR, however patient wife wanted home with home health.  Patient with a history of [facility] [facility] for outreach. Met with the patient at bedside, HIPAA verified, states he is waiting on family. Patient verbalized follow up with [facility], brochure given.  Can reach out to wife for information.  Primary Care Provider is Ronnell Freshwater, NP [place] this provider is listed to provide the transition of care [TOC] for post hospital follow up.   Plan: Will have patient assigned for follow up to restart For questions contact:   Natividad Brood, RN BSN [facility] Liaison  [phone] business mobile phone Toll free office [phone]  Fax number: [phone] [email or web address]

## 2019-08-31 NOTE — Progress Notes (Signed)
Paged Dr Erlinda Hong, after tele called to say HR is sustaining in the 150s.  He was working with and walking with PT/OT during the event.  Once he sat back down his HR went back down to the low 100s.  Dr Erlinda Hong said he is aware that he has a-fib but pt and wife do not want intervention at this time, although he explained all the risks and benefits of treatment.

## 2019-09-01 ENCOUNTER — Encounter (HOSPITAL_COMMUNITY): Payer: Self-pay

## 2019-09-01 ENCOUNTER — Other Ambulatory Visit: Payer: Self-pay | Admitting: *Deleted

## 2019-09-01 ENCOUNTER — Other Ambulatory Visit: Payer: Self-pay | Admitting: Neurology

## 2019-09-01 DIAGNOSIS — I639 Cerebral infarction, unspecified: Secondary | ICD-10-CM

## 2019-09-01 NOTE — Patient Outreach (Signed)
[place] [facility]) Care Management  09/01/2019  JAION LAGRANGE 1933-12-25 [id number]   Referral received from hospital liaison as member was discharged home yesterday after diagnosis of stroke.  Call placed to [facility] caregiver/wife, no answer.  HIPAA compliant voice message left.  Will send unsuccessful outreach letter and follow up within the next 3-4 business days.  [name], [place], MSN [place] [phone]

## 2019-09-03 ENCOUNTER — Telehealth: Payer: Self-pay

## 2019-09-03 NOTE — Telephone Encounter (Signed)
-----   Message from Ronnell Freshwater, NP sent at 09/03/2019  1:25 PM EDT ----- This is super frustrating. I had him in for a visit because of this. And he and his family member decided they would stay on this medication despite the cost. The only other option right now is insulin. If he wants to switch, I will have to see him back again to discuss insulin with him again.   ----- Message ----- From: Edd Arbour, CMA Sent: 09/03/2019  11:42 AM EDT To: Ronnell Freshwater, NP  [facility] pharmacist with [facility] called and advised that the Januvia was 138.00 per month for patient and he can't afford that.  She is asking if it can be change to something cheaper.  If so we can inform pt with the changes.  dbs

## 2019-09-03 NOTE — Telephone Encounter (Signed)
Spoke to pt's wife and she said at the time they are going to discuss what they want to do about changing and will call back and make an appt if they decide to change to something else.  Pt isn't crazy about having to stick himself with a needle.  dbs

## 2019-09-07 ENCOUNTER — Other Ambulatory Visit: Payer: Self-pay | Admitting: *Deleted

## 2019-09-07 ENCOUNTER — Encounter: Payer: Self-pay | Admitting: *Deleted

## 2019-09-07 ENCOUNTER — Other Ambulatory Visit: Payer: Medicare Other
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
## 2019-09-08 ENCOUNTER — Encounter: Payer: Self-pay | Admitting: *Deleted

## 2019-09-08 ENCOUNTER — Other Ambulatory Visit: Payer: Self-pay | Admitting: *Deleted
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
## 2019-09-09 ENCOUNTER — Ambulatory Visit: Payer: Medicare Other

## 2019-09-09 ENCOUNTER — Ambulatory Visit: Payer: Medicare Other | Admitting: Oncology

## 2019-09-09 DIAGNOSIS — H9190 Unspecified hearing loss, unspecified ear: Secondary | ICD-10-CM | POA: Diagnosis not present

## 2019-09-09 DIAGNOSIS — N1832 Chronic kidney disease, stage 3b: Secondary | ICD-10-CM | POA: Diagnosis not present

## 2019-09-09 DIAGNOSIS — Z87891 Personal history of nicotine dependence: Secondary | ICD-10-CM | POA: Diagnosis not present

## 2019-09-09 DIAGNOSIS — M4802 Spinal stenosis, cervical region: Secondary | ICD-10-CM | POA: Diagnosis not present

## 2019-09-09 DIAGNOSIS — I0981 Rheumatic heart failure: Secondary | ICD-10-CM | POA: Diagnosis not present

## 2019-09-09 DIAGNOSIS — J9811 Atelectasis: Secondary | ICD-10-CM | POA: Diagnosis not present

## 2019-09-09 DIAGNOSIS — M47812 Spondylosis without myelopathy or radiculopathy, cervical region: Secondary | ICD-10-CM | POA: Diagnosis not present

## 2019-09-09 DIAGNOSIS — Z9981 Dependence on supplemental oxygen: Secondary | ICD-10-CM | POA: Diagnosis not present

## 2019-09-09 DIAGNOSIS — E1122 Type 2 diabetes mellitus with diabetic chronic kidney disease: Secondary | ICD-10-CM | POA: Diagnosis not present

## 2019-09-09 DIAGNOSIS — I509 Heart failure, unspecified: Secondary | ICD-10-CM | POA: Diagnosis not present

## 2019-09-09 DIAGNOSIS — I130 Hypertensive heart and chronic kidney disease with heart failure and stage 1 through stage 4 chronic kidney disease, or unspecified chronic kidney disease: Secondary | ICD-10-CM | POA: Diagnosis not present

## 2019-09-09 DIAGNOSIS — I69322 Dysarthria following cerebral infarction: Secondary | ICD-10-CM | POA: Diagnosis not present

## 2019-09-09 DIAGNOSIS — I700 Atherosclerosis of aorta: Secondary | ICD-10-CM | POA: Diagnosis not present

## 2019-09-09 DIAGNOSIS — I480 Paroxysmal atrial fibrillation: Secondary | ICD-10-CM | POA: Diagnosis not present

## 2019-09-09 DIAGNOSIS — Z9181 History of falling: Secondary | ICD-10-CM | POA: Diagnosis not present

## 2019-09-09 DIAGNOSIS — N200 Calculus of kidney: Secondary | ICD-10-CM | POA: Diagnosis not present

## 2019-09-09 DIAGNOSIS — I088 Other rheumatic multiple valve diseases: Secondary | ICD-10-CM | POA: Diagnosis not present

## 2019-09-09 DIAGNOSIS — I69392 Facial weakness following cerebral infarction: Secondary | ICD-10-CM | POA: Diagnosis not present

## 2019-09-09 DIAGNOSIS — I69320 Aphasia following cerebral infarction: Secondary | ICD-10-CM | POA: Diagnosis not present

## 2019-09-09 DIAGNOSIS — G319 Degenerative disease of nervous system, unspecified: Secondary | ICD-10-CM | POA: Diagnosis not present

## 2019-09-09 DIAGNOSIS — I69351 Hemiplegia and hemiparesis following cerebral infarction affecting right dominant side: Secondary | ICD-10-CM | POA: Diagnosis not present

## 2019-09-09 DIAGNOSIS — M2578 Osteophyte, vertebrae: Secondary | ICD-10-CM | POA: Diagnosis not present

## 2019-09-09 DIAGNOSIS — Z7982 Long term (current) use of aspirin: Secondary | ICD-10-CM | POA: Diagnosis not present

## 2019-09-09 DIAGNOSIS — E785 Hyperlipidemia, unspecified: Secondary | ICD-10-CM | POA: Diagnosis not present

## 2019-09-10 DIAGNOSIS — J9811 Atelectasis: Secondary | ICD-10-CM | POA: Diagnosis not present

## 2019-09-10 DIAGNOSIS — I700 Atherosclerosis of aorta: Secondary | ICD-10-CM | POA: Diagnosis not present

## 2019-09-10 DIAGNOSIS — E1122 Type 2 diabetes mellitus with diabetic chronic kidney disease: Secondary | ICD-10-CM | POA: Diagnosis not present

## 2019-09-10 DIAGNOSIS — I69322 Dysarthria following cerebral infarction: Secondary | ICD-10-CM | POA: Diagnosis not present

## 2019-09-10 DIAGNOSIS — I69320 Aphasia following cerebral infarction: Secondary | ICD-10-CM | POA: Diagnosis not present

## 2019-09-10 DIAGNOSIS — Z9181 History of falling: Secondary | ICD-10-CM | POA: Diagnosis not present

## 2019-09-10 DIAGNOSIS — I69392 Facial weakness following cerebral infarction: Secondary | ICD-10-CM | POA: Diagnosis not present

## 2019-09-10 DIAGNOSIS — E785 Hyperlipidemia, unspecified: Secondary | ICD-10-CM | POA: Diagnosis not present

## 2019-09-10 DIAGNOSIS — H9190 Unspecified hearing loss, unspecified ear: Secondary | ICD-10-CM | POA: Diagnosis not present

## 2019-09-10 DIAGNOSIS — M2578 Osteophyte, vertebrae: Secondary | ICD-10-CM | POA: Diagnosis not present

## 2019-09-10 DIAGNOSIS — I480 Paroxysmal atrial fibrillation: Secondary | ICD-10-CM | POA: Diagnosis not present

## 2019-09-10 DIAGNOSIS — N200 Calculus of kidney: Secondary | ICD-10-CM | POA: Diagnosis not present

## 2019-09-10 DIAGNOSIS — I0981 Rheumatic heart failure: Secondary | ICD-10-CM | POA: Diagnosis not present

## 2019-09-10 DIAGNOSIS — N1832 Chronic kidney disease, stage 3b: Secondary | ICD-10-CM | POA: Diagnosis not present

## 2019-09-10 DIAGNOSIS — Z9981 Dependence on supplemental oxygen: Secondary | ICD-10-CM | POA: Diagnosis not present

## 2019-09-10 DIAGNOSIS — M47812 Spondylosis without myelopathy or radiculopathy, cervical region: Secondary | ICD-10-CM | POA: Diagnosis not present

## 2019-09-10 DIAGNOSIS — Z7982 Long term (current) use of aspirin: Secondary | ICD-10-CM | POA: Diagnosis not present

## 2019-09-10 DIAGNOSIS — G319 Degenerative disease of nervous system, unspecified: Secondary | ICD-10-CM | POA: Diagnosis not present

## 2019-09-10 DIAGNOSIS — Z87891 Personal history of nicotine dependence: Secondary | ICD-10-CM | POA: Diagnosis not present

## 2019-09-10 DIAGNOSIS — M4802 Spinal stenosis, cervical region: Secondary | ICD-10-CM | POA: Diagnosis not present

## 2019-09-10 DIAGNOSIS — I69351 Hemiplegia and hemiparesis following cerebral infarction affecting right dominant side: Secondary | ICD-10-CM | POA: Diagnosis not present

## 2019-09-10 DIAGNOSIS — I088 Other rheumatic multiple valve diseases: Secondary | ICD-10-CM | POA: Diagnosis not present

## 2019-09-10 DIAGNOSIS — I130 Hypertensive heart and chronic kidney disease with heart failure and stage 1 through stage 4 chronic kidney disease, or unspecified chronic kidney disease: Secondary | ICD-10-CM | POA: Diagnosis not present

## 2019-09-10 DIAGNOSIS — I509 Heart failure, unspecified: Secondary | ICD-10-CM | POA: Diagnosis not present

## 2019-09-13 DIAGNOSIS — R600 Localized edema: Secondary | ICD-10-CM | POA: Diagnosis not present

## 2019-09-13 DIAGNOSIS — I482 Chronic atrial fibrillation, unspecified: Secondary | ICD-10-CM | POA: Diagnosis not present

## 2019-09-13 DIAGNOSIS — I10 Essential (primary) hypertension: Secondary | ICD-10-CM | POA: Diagnosis not present

## 2019-09-13 DIAGNOSIS — E782 Mixed hyperlipidemia: Secondary | ICD-10-CM | POA: Diagnosis not present

## 2019-09-14 ENCOUNTER — Other Ambulatory Visit: Payer: Self-pay

## 2019-09-14 ENCOUNTER — Encounter: Payer: Self-pay | Admitting: Hospice and Palliative Medicine

## 2019-09-14 ENCOUNTER — Ambulatory Visit (INDEPENDENT_AMBULATORY_CARE_PROVIDER_SITE_OTHER): Payer: Medicare Other | Admitting: Hospice and Palliative Medicine

## 2019-09-14 DIAGNOSIS — I130 Hypertensive heart and chronic kidney disease with heart failure and stage 1 through stage 4 chronic kidney disease, or unspecified chronic kidney disease: Secondary | ICD-10-CM | POA: Diagnosis not present

## 2019-09-14 DIAGNOSIS — I088 Other rheumatic multiple valve diseases: Secondary | ICD-10-CM | POA: Diagnosis not present

## 2019-09-14 DIAGNOSIS — Z9981 Dependence on supplemental oxygen: Secondary | ICD-10-CM | POA: Diagnosis not present

## 2019-09-14 DIAGNOSIS — Z8673 Personal history of transient ischemic attack (TIA), and cerebral infarction without residual deficits: Secondary | ICD-10-CM

## 2019-09-14 DIAGNOSIS — I69320 Aphasia following cerebral infarction: Secondary | ICD-10-CM | POA: Diagnosis not present

## 2019-09-14 DIAGNOSIS — Z9229 Personal history of other drug therapy: Secondary | ICD-10-CM

## 2019-09-14 DIAGNOSIS — I69322 Dysarthria following cerebral infarction: Secondary | ICD-10-CM | POA: Diagnosis not present

## 2019-09-14 DIAGNOSIS — H9190 Unspecified hearing loss, unspecified ear: Secondary | ICD-10-CM | POA: Diagnosis not present

## 2019-09-14 DIAGNOSIS — G319 Degenerative disease of nervous system, unspecified: Secondary | ICD-10-CM | POA: Diagnosis not present

## 2019-09-14 DIAGNOSIS — Z87891 Personal history of nicotine dependence: Secondary | ICD-10-CM | POA: Diagnosis not present

## 2019-09-14 DIAGNOSIS — M2578 Osteophyte, vertebrae: Secondary | ICD-10-CM | POA: Diagnosis not present

## 2019-09-14 DIAGNOSIS — I480 Paroxysmal atrial fibrillation: Secondary | ICD-10-CM | POA: Diagnosis not present

## 2019-09-14 DIAGNOSIS — I69392 Facial weakness following cerebral infarction: Secondary | ICD-10-CM | POA: Diagnosis not present

## 2019-09-14 DIAGNOSIS — N1832 Chronic kidney disease, stage 3b: Secondary | ICD-10-CM | POA: Diagnosis not present

## 2019-09-14 DIAGNOSIS — J9811 Atelectasis: Secondary | ICD-10-CM | POA: Diagnosis not present

## 2019-09-14 DIAGNOSIS — Z9181 History of falling: Secondary | ICD-10-CM | POA: Diagnosis not present

## 2019-09-14 DIAGNOSIS — M4802 Spinal stenosis, cervical region: Secondary | ICD-10-CM | POA: Diagnosis not present

## 2019-09-14 DIAGNOSIS — Z7982 Long term (current) use of aspirin: Secondary | ICD-10-CM | POA: Diagnosis not present

## 2019-09-14 DIAGNOSIS — M47812 Spondylosis without myelopathy or radiculopathy, cervical region: Secondary | ICD-10-CM | POA: Diagnosis not present

## 2019-09-14 DIAGNOSIS — N4289 Other specified disorders of prostate: Secondary | ICD-10-CM | POA: Diagnosis not present

## 2019-09-14 DIAGNOSIS — E785 Hyperlipidemia, unspecified: Secondary | ICD-10-CM | POA: Diagnosis not present

## 2019-09-14 DIAGNOSIS — I509 Heart failure, unspecified: Secondary | ICD-10-CM | POA: Diagnosis not present

## 2019-09-14 DIAGNOSIS — N200 Calculus of kidney: Secondary | ICD-10-CM | POA: Diagnosis not present

## 2019-09-14 DIAGNOSIS — I0981 Rheumatic heart failure: Secondary | ICD-10-CM | POA: Diagnosis not present

## 2019-09-14 DIAGNOSIS — I69351 Hemiplegia and hemiparesis following cerebral infarction affecting right dominant side: Secondary | ICD-10-CM | POA: Diagnosis not present

## 2019-09-14 DIAGNOSIS — I700 Atherosclerosis of aorta: Secondary | ICD-10-CM | POA: Diagnosis not present

## 2019-09-14 DIAGNOSIS — E1122 Type 2 diabetes mellitus with diabetic chronic kidney disease: Secondary | ICD-10-CM | POA: Diagnosis not present

## 2019-09-14 NOTE — Progress Notes (Signed)
[facility] [place], [place] [address]  Internal MEDICINE  Office Visit Note  Patient Name: [name]  [id number]  [id number]  Date of Service: 09/14/2019  Chief Complaint  Patient presents with  . Hospitalization Follow-up    pt had stroke August 27, 2019     HPI Pt is here for recent hospital follow up. Hospitalized at [facility] 8/6-8/11 for acute ischemic left MCA stroke. His health has been complicated since June of this year from GI bleeding secondary to Eliquis, mass found on liver and reoccurrence of prostate cancer. At discharge, Eliquis held until patient could discuss with cardiologist due to previous GI bleeding. Was seen by Dr. [name] 8/23, recommendations to restart Eliquis if approval given by oncologist. Scheduled to see oncologist tomorrow, will further discuss Eliquis treatment at visit. At this time he is on ASA 325 mg for stroke prevention. Left with right sided weakness from stroke, requiring the use of walker for ambulation. PT and OT has been to home and feel no further therapy is needed. Reports no complications with his swallowing. Wife accompanied and present in office today at visit. She expresses the overwhelming feelings both her and her husband have been feeling over the last few months with hs decline in health. BP and HR stable today.  Current Medication: Outpatient Encounter Medications as of 09/14/2019  Medication Sig  . albuterol (VENTOLIN HFA) 108 (90 Base) MCG/ACT inhaler Inhale 2 puffs into the lungs every 4 (four) hours as needed for wheezing or shortness of breath.  Marland Kitchen aspirin EC 325 MG EC tablet Take 1 tablet (325 mg total) by mouth daily.  Marland Kitchen atorvastatin (LIPITOR) 10 MG tablet Take 1 tablet (10 mg total) by mouth daily.  . ferrous sulfate 325 (65 FE) MG tablet Take 325 mg by mouth daily.   Marland Kitchen glucose blood (ONETOUCH VERIO) test strip USE ONCE DAILY. DX E11.65.  . metoprolol tartrate (LOPRESSOR) 25 MG  tablet Take 1 tablet (25 mg total) by mouth 2 (two) times daily.  . pantoprazole (PROTONIX) 40 MG tablet Take 1 tablet (40 mg total) by mouth daily.  Marland Kitchen senna-docusate (SENOKOT-S) 8.6-50 MG tablet Take 1 tablet by mouth at bedtime as needed for mild constipation.  . sitaGLIPtin (JANUVIA) 25 MG tablet Take 1 tablet (25 mg total) by mouth daily.   No facility-administered encounter medications on file as of 09/14/2019.    Surgical History: Past Surgical History:  Procedure Laterality Date  . CATARACT EXTRACTION    . COLONOSCOPY WITH PROPOFOL N/A 07/02/2019   Procedure: COLONOSCOPY WITH PROPOFOL;  Surgeon: Lin Landsman, MD;  Location[facility] ENDOSCOPY;  Service: Gastroenterology;  Laterality: N/A;  . IR CT HEAD LTD  08/27/2019  . IR PERCUTANEOUS ART THROMBECTOMY/INFUSION INTRACRANIAL INC DIAG ANGIO  08/27/2019      . IR PERCUTANEOUS ART THROMBECTOMY/INFUSION INTRACRANIAL INC DIAG ANGIO  08/27/2019  . PROSTATE CANCER    . PROSTATE SURGERY    . RADIOLOGY WITH ANESTHESIA N/A 08/27/2019   Procedure: IR WITH ANESTHESIA;  Surgeon: Luanne Bras, MD;  Location: [facility];  Service: Radiology;  Laterality: N/A;    Medical History: Past Medical History:  Diagnosis Date  . Atrial fibrillation ([place])   . CHF (congestive heart failure) ([place])   . Diabetes ([place])   . Hearing loss   . High blood pressure   . History of bladder problems   . Kidney stones 11/22/2014  . Prostate cancer [facility])     Family History: Family  History  Problem Relation Age of Onset  . Colon cancer Mother   . Diabetes Other   . High blood pressure Other   . Prostate cancer Brother   . Diabetes Brother     Social History   Socioeconomic History  . Marital status: Married    Spouse name: Jeff Frieden  . Number of children: 2  . Years of education: 48  . Highest education level: 12th grade  Occupational History  . Occupation: [employer]  Tobacco Use  . Smoking status: Former Smoker    Years: 16.00    Types:  Cigarettes  . Smokeless tobacco: Never Used  Vaping Use  . Vaping Use: Never used  Substance and Sexual Activity  . Alcohol use: No  . Drug use: No  . Sexual activity: Not Currently  Other Topics Concern  . Not on file  Social History Narrative  . Not on file   Social Determinants of Health   Financial Resource Strain: Medium Risk  . Difficulty of Paying Living Expenses: Somewhat hard  Food Insecurity: No Food Insecurity  . Worried About Charity fundraiser in the Last Year: Never true  . Ran Out of Food in the Last Year: Never true  Transportation Needs: No Transportation Needs  . Lack of Transportation (Medical): No  . Lack of Transportation (Non-Medical): No  Physical Activity: Inactive  . Days of Exercise per Week: 0 days  . Minutes of Exercise per Session: 0 min  Stress: Stress Concern Present  . Feeling of Stress : To some extent  Social Connections: Moderately Integrated  . Frequency of Communication with Friends and Family: More than three times a week  . Frequency of Social Gatherings with Friends and Family: More than three times a week  . Attends Religious Services: 1 to 4 times per year  . Active Member of Clubs or Organizations: No  . Attends Archivist Meetings: Never  . Marital Status: Married  Human resources officer Violence: Not At Risk  . Fear of Current or Ex-Partner: No  . Emotionally Abused: No  . Physically Abused: No  . Sexually Abused: No   Review of Systems  Constitutional: Positive for fatigue. Negative for chills, fever and unexpected weight change.  HENT: Negative for congestion, rhinorrhea, sneezing, sore throat and trouble swallowing.   Eyes: Negative for photophobia and visual disturbance.  Respiratory: Negative for cough, chest tightness and shortness of breath.   Cardiovascular: Negative for chest pain, palpitations and leg swelling.  Gastrointestinal: Positive for constipation. Negative for abdominal pain, diarrhea, nausea and  vomiting.  Genitourinary: Negative for dysuria and frequency.  Musculoskeletal: Positive for gait problem. Negative for arthralgias, back pain, joint swelling and neck pain.       Requires walker, right sided weakness secondary to stroke  Skin: Negative for rash and wound.  Neurological: Positive for weakness. Negative for dizziness, tremors, light-headedness, numbness and headaches.       Right sided weakness secondary to stroke  Hematological: Negative for adenopathy. Bruises/bleeds easily.       GI bleed in June 2020, secondary to Eliquis thearpy  Psychiatric/Behavioral: Negative for behavioral problems (Depression), sleep disturbance and suicidal ideas. The patient is not nervous/anxious.     Vital Signs: BP 118/68   Pulse 63   Temp (!) 97.3 F (36.3 C)   Resp 16   Ht 6' (1.829 m)   Wt 238 lb 6.4 oz (108.1 kg)   SpO2 95%   BMI 32.33 kg/m  Physical Exam Constitutional:      Appearance: Normal appearance.  HENT:     Mouth/Throat:     Mouth: Mucous membranes are moist.     Pharynx: Oropharynx is clear.     Comments: Slurred speech Cardiovascular:     Rate and Rhythm: Normal rate and regular rhythm.     Pulses: Normal pulses.     Heart sounds: Normal heart sounds.  Pulmonary:     Effort: Pulmonary effort is normal.     Breath sounds: Normal breath sounds.  Abdominal:     General: Abdomen is flat.     Palpations: Abdomen is soft.  Musculoskeletal:        General: Normal range of motion.     Cervical back: Normal range of motion.  Skin:    General: Skin is warm.  Neurological:     General: No focal deficit present.     Mental Status: He is alert and oriented to person, place, and time. Mental status is at baseline.     Motor: Weakness present.     Gait: Gait abnormal.     Comments: Right sided weakness, requiring walker for ambulation assistance  Psychiatric:        Mood and Affect: Mood normal.        Behavior: Behavior normal.        Thought Content: Thought  content normal.    Assessment/Plan: 1. History of cerebrovascular accident (CVA) due to ischemia Hospitalized recently for ischemic stroke, d/c 8/11 to home. Residual left sided weakness requiring walker for ambulation assistance. Swallow evaluation performed while hospitalized, no evidence of aspiration.  2. Personal history of long-term (current) use of anticoagulants Has been on Eliquis for several years due to his history of atrial fibrillation. Has been off of Eliquis since June 2021 due to GI bleeding, was hospitalized at that time due to excessive bleeding. Conversations between cardiologist as well as oncologist for potential restarting on Eliquis. At this time remains in ASA 325 mg. Further provided discussions about risks and benefits of remaining off or restarting Eliquis. With his advanced age and gait disturbances he is high risk for falls.  3. Prostate mass While hospitalized, prostate mass found. Recurrent mass of prostate and he was treated for prostate cancer a few years prior. Scheduled to see oncologist for further diagnosis as well as treatment options tomorrow.  General Counseling: donald memoli understanding of the findings of todays visit and agrees with plan of treatment. I have discussed any further diagnostic evaluation that may be needed or ordered today. We also reviewed his medications today. he has been encouraged to call the office with any questions or concerns that should arise related to todays visit.   I have reviewed all medical records from hospital follow up including radiology reports and consults from other physicians. Appropriate follow up diagnostics will be scheduled as needed. Patient/ Family understands the plan of treatment.   Time spent 35 minutes.  This patient was seen by Theodoro Grist AGNP-C in Collaboration with Dr Lavera Guise as a part of collaborative care agreement  Tanna Furry. [facility] Internal Medicine

## 2019-09-15 ENCOUNTER — Inpatient Hospital Stay: Payer: Medicare Other | Attending: Oncology

## 2019-09-15 ENCOUNTER — Encounter: Payer: Self-pay | Admitting: *Deleted

## 2019-09-15 ENCOUNTER — Other Ambulatory Visit: Payer: Self-pay | Admitting: *Deleted

## 2019-09-15 DIAGNOSIS — D649 Anemia, unspecified: Secondary | ICD-10-CM

## 2019-09-15 DIAGNOSIS — Z8673 Personal history of transient ischemic attack (TIA), and cerebral infarction without residual deficits: Secondary | ICD-10-CM | POA: Diagnosis not present

## 2019-09-15 DIAGNOSIS — C61 Malignant neoplasm of prostate: Secondary | ICD-10-CM | POA: Diagnosis not present

## 2019-09-15 DIAGNOSIS — Z7901 Long term (current) use of anticoagulants: Secondary | ICD-10-CM | POA: Diagnosis not present

## 2019-09-15 DIAGNOSIS — Z79899 Other long term (current) drug therapy: Secondary | ICD-10-CM | POA: Diagnosis not present

## 2019-09-15 LAB — CBC WITH DIFFERENTIAL/PLATELET
Abs Immature Granulocytes: 0.03 10*3/uL (ref 0.00–0.07)
Basophils Absolute: 0 10*3/uL (ref 0.0–0.1)
Basophils Relative: 1 %
Eosinophils Absolute: 0.6 10*3/uL — ABNORMAL HIGH (ref 0.0–0.5)
Eosinophils Relative: 9 %
HCT: 41.8 % (ref 39.0–52.0)
Hemoglobin: 13.1 g/dL (ref 13.0–17.0)
Immature Granulocytes: 0 %
Lymphocytes Relative: 19 %
Lymphs Abs: 1.3 10*3/uL (ref 0.7–4.0)
MCH: 28.7 pg (ref 26.0–34.0)
MCHC: 31.3 g/dL (ref 30.0–36.0)
MCV: 91.7 fL (ref 80.0–100.0)
Monocytes Absolute: 0.7 10*3/uL (ref 0.1–1.0)
Monocytes Relative: 10 %
Neutro Abs: 4.1 10*3/uL (ref 1.7–7.7)
Neutrophils Relative %: 61 %
Platelets: 229 10*3/uL (ref 150–400)
RBC: 4.56 MIL/uL (ref 4.22–5.81)
RDW: 14.9 % (ref 11.5–15.5)
WBC: 6.7 10*3/uL (ref 4.0–10.5)
nRBC: 0 % (ref 0.0–0.2)

## 2019-09-15 LAB — VITAMIN B12: Vitamin B-12: 231 pg/mL (ref 180–914)

## 2019-09-15 LAB — FOLATE: Folate: 13.6 ng/mL (ref >5.9)

## 2019-09-15 LAB — PSA: Prostatic Specific Antigen: 19.66 ng/mL — ABNORMAL HIGH (ref 0.00–4.00)

## 2019-09-15 NOTE — Patient Outreach (Signed)
[facility]) Care Management  09/15/2019  [name] 08/24/1933 [id number]   CSW was able to make contact with patient's wife, [name] today, to follow-up regarding social work services and resources for patient, as well as to ensure that she received the packet of resource information and applications mailed to her home by CSW last week, Wednesday, September 08, 2019.  [name] confirmed receipt, admitting that she has really not had an opportunity to review the contents of the packet, "always busy caring for Bekim and running him back and forth to doctors appointments".  CSW voiced understanding, offering to review the contents of the packet with [name], as well as assist with application completion and submission.  [name] voiced her sincere appreciation, but declined, admitting "she has entirely too much going on right now to focus on anything else".  CSW was able to confirm that [name] has the correct contact information for CSW, encouraging [name] to contact CSW directly if she changes her mind, or if additional social work needs arise in the near future.  [name] was agreeable to this plan.    [name] indicated that she has already contacted [employer], arranging transportation for patient to and from his appointment tomorrow (Thursday, September 16, 2019) with Dr. Earlie Server, Oncologist at the [facility].  In addition, transportation arrangements have been made for patient to his appointment with Dr. Noreene Filbert, Radiation Oncologist, also at the [facility].  CSW will perform a case closure on patient, as all goals of treatment have been met from social work standpoint and no additional social work needs have been identified at this time.  CSW will notify patient's RNCM with [facility], [name] of CSW's  plans to close patient's case.  CSW will fax an update to patient's Primary Care Physician, Dr. Leretha Pol to ensure that she is aware of CSW's involvement with patient's plan of care, as well as route a Physician Case Closure Letter.    Nat Christen, BSW, MSW, LCSW  Licensed Education officer, [facility]  Mailing [place] [address], [place], [place] [address] Physical Address[address] [place], [place] [address] Toll Free Main # [phone] Fax # [phone] Cell # [phone]  [email or web address]

## 2019-09-16 ENCOUNTER — Other Ambulatory Visit: Payer: Self-pay

## 2019-09-16 ENCOUNTER — Inpatient Hospital Stay: Payer: Medicare Other

## 2019-09-16 ENCOUNTER — Encounter: Payer: Self-pay | Admitting: Oncology

## 2019-09-16 ENCOUNTER — Inpatient Hospital Stay (HOSPITAL_BASED_OUTPATIENT_CLINIC_OR_DEPARTMENT_OTHER): Payer: Medicare Other | Admitting: Oncology

## 2019-09-16 VITALS — BP 118/73 | HR 73 | Temp 95.6°F | Resp 18 | Wt 238.0 lb

## 2019-09-16 DIAGNOSIS — Z7901 Long term (current) use of anticoagulants: Secondary | ICD-10-CM | POA: Diagnosis not present

## 2019-09-16 DIAGNOSIS — I639 Cerebral infarction, unspecified: Secondary | ICD-10-CM

## 2019-09-16 DIAGNOSIS — Z79899 Other long term (current) drug therapy: Secondary | ICD-10-CM | POA: Diagnosis not present

## 2019-09-16 DIAGNOSIS — Z8673 Personal history of transient ischemic attack (TIA), and cerebral infarction without residual deficits: Secondary | ICD-10-CM | POA: Diagnosis not present

## 2019-09-16 DIAGNOSIS — C61 Malignant neoplasm of prostate: Secondary | ICD-10-CM | POA: Diagnosis not present

## 2019-09-16 DIAGNOSIS — Z79818 Long term (current) use of other agents affecting estrogen receptors and estrogen levels: Secondary | ICD-10-CM

## 2019-09-16 LAB — TESTOSTERONE: Testosterone: 26 ng/dL — ABNORMAL LOW (ref 264–916)

## 2019-09-16 MED ORDER — VITAMIN B-12 1000 MCG PO TABS: 1000.0000 ug | ORAL_TABLET | Freq: Every day | ORAL | 3 refills | Status: DC

## 2019-09-16 MED ORDER — DEGARELIX ACETATE 80 MG ~~LOC~~ SOLR
80.0000 mg | Freq: Once | SUBCUTANEOUS | Status: AC
  Administered 2019-09-16: 80 mg via SUBCUTANEOUS
  Filled 2019-09-16: qty 4

## 2019-09-16 NOTE — Progress Notes (Signed)
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 stenosis. Left cavernous carotid widely patent. Atherosclerotic irregularity in the anterior cerebral arteries bilaterally which is relatively severe. Atherosclerotic irregularity in right middle cerebral artery branches without large vessel occlusion. Posterior circulation: Left vertebral artery is patent. There appears to be a severe stenosis or possibly segmental occlusion of the V4 segment on the right. Basilar is patent. Superior cerebellar and posterior cerebral arteries patent bilaterally. Venous sinuses: Normal venous enhancement. Anatomic variants: None Review of the MIP images confirms the above findings CT Brain Perfusion Findings: ASPECTS: 10 CBF (<30%) Volume: 035mPerfusion (Tmax>6.0s) volume: 9875mismatch Volume: 4m80mfarction Location:Left MCA  territory IMPRESSION: 1. CT perfusion demonstrates 98 mL of delayed perfusion left MCA territory without core infarct. Aspects 10. 2. Occlusion left M1 segment which appears acute. There is collateral circulation to left MCA branches. 3. Carotid artery in the neck is normal bilaterally 4. Left  vertebral artery is patent without significant stenosis 5. Occlusion of the proximal half of the right vertebral artery with additional high-grade stenosis or occlusion right V4 segment. 6. Intracranial atherosclerotic disease. 7. These results were called by telephone at the time of interpretation on 08/27/2019 at 7:50 Pm to provider [facility] , who verbally acknowledged these results. Electronically Signed   By: Franchot Gallo M.D.   On: 08/27/2019 20:01   DG Chest Port 1 View  Result Date: 08/27/2019 CLINICAL DATA:  Acute ischemic stroke EXAM: PORTABLE CHEST 1 VIEW COMPARISON:  Radiograph 06/19/2019, CT 06/20/2019 FINDINGS: Cardiomegaly similar to comparison CT and likely accentuated by the portable technique and low lung volumes. Some streaky opacities are present in the lung bases with mild vascular congestion. No pneumothorax or visible effusion. No acute osseous or soft tissue abnormality. Degenerative changes are present in the imaged spine and shoulders. Telemetry leads overlie the chest. IMPRESSION: 1. Cardiomegaly and mild vascular congestion. 2. Streaky opacities in the lung bases, favor atelectasis and/or mild edema. 3.  Aortic Atherosclerosis (ICD10-I70.0). Electronically Signed   By: Lovena Le M.D.   On: 08/27/2019 23:15   ECHOCARDIOGRAM COMPLETE  Result Date: 08/28/2019    ECHOCARDIOGRAM REPORT   Patient Name:   NAHUN KRONBERG Date of Exam: 08/28/2019 Medical Rec #:  [id number]         Height:       72.0 in Accession #:    [id number]        Weight:       220.2 lb Date of Birth:  1933/04/21          BSA:          2.220 m Patient Age:    37 years          BP:           131/73 mmHg Patient Gender: M                 HR:           80 bpm. Exam Location:  Inpatient Procedure: 2D Echo, Cardiac Doppler and Color Doppler Indications:     Stroke  History:         Patient has prior history of Echocardiogram examinations, most                  recent 12/11/2018. CHF, Arrythmias:Atrial Fibrillation;  Risk                  Factors:Diabetes, Hypertension, Dyslipidemia, Sleep Apnea and                  Former Smoker. CKD.  Sonographer:     Clayton Lefort RDCS (AE) Referring Phys:  [id number] Lanice Schwab AROOR Diagnosing Phys: Fransico Him MD  Sonographer Comments: Limited patient mobility. IMPRESSIONS  1. Left ventricular ejection fraction, by estimation, is 60 to 65%. The left ventricle has normal function. The left ventricle has no regional wall motion abnormalities. There is moderate concentric left ventricular hypertrophy with severe basal septal hypertrophy. Left ventricular diastolic function could not be evaluated.  2. Right ventricular systolic function is normal. The right ventricular size is normal. Tricuspid regurgitation signal is inadequate for assessing PA  pressure.  3. Left atrial size was mildly dilated.  4. The mitral valve is normal in structure. Trivial mitral valve regurgitation. No evidence of mitral stenosis.  5. The aortic valve is tricuspid. Aortic valve regurgitation is not visualized. Mild aortic valve sclerosis is present, with no evidence of aortic valve stenosis.  6. Aortic dilatation noted. There is mild dilatation of the aortic root measuring 39 mm.  7. The inferior vena cava is dilated in size with <50% respiratory variability, suggesting right atrial pressure of 15 mmHg.  8. Right atrial size was mildly dilated. FINDINGS  Left Ventricle: Left ventricular ejection fraction, by estimation, is 55 to 60%. The left ventricle has normal function. The left ventricle has no regional wall motion abnormalities. The left ventricular internal cavity size was normal in size. There is  moderate concentric left ventricular hypertrophy. Left ventricular diastolic parameters are indeterminate. Right Ventricle: The right ventricular size is normal. No increase in right ventricular wall thickness. Right ventricular systolic function is normal. Tricuspid regurgitation signal is inadequate for assessing PA  pressure. Left Atrium: Left atrial size was mildly dilated. Right Atrium: Right atrial size was mildly dilated. Pericardium: There is no evidence of pericardial effusion. Mitral Valve: The mitral valve is normal in structure. Normal mobility of the mitral valve leaflets. Trivial mitral valve regurgitation. No evidence of mitral valve stenosis. Tricuspid Valve: The tricuspid valve is normal in structure. Tricuspid valve regurgitation is trivial. No evidence of tricuspid stenosis. Aortic Valve: The aortic valve is tricuspid. . There is severe thickening and severe calcifcation of the aortic valve. Aortic valve regurgitation is not visualized. Mild aortic valve sclerosis is present, with no evidence of aortic valve stenosis. There is severe thickening of the aortic valve. There is severe calcifcation of the aortic valve. Aortic valve mean gradient measures 1.0 mmHg. Aortic valve peak gradient measures 2.5 mmHg. Aortic valve area, by VTI measures 3.58 cm. Pulmonic Valve: The pulmonic valve was normal in structure. Pulmonic valve regurgitation is trivial. No evidence of pulmonic stenosis. Aorta: Aortic dilatation noted. There is mild dilatation of the aortic root measuring 39 mm. Venous: The inferior vena cava is dilated in size with less than 50% respiratory variability, suggesting right atrial pressure of 15 mmHg. IAS/Shunts: The interatrial septum appears to be lipomatous. No atrial level shunt detected by color flow Doppler.  LEFT VENTRICLE PLAX 2D LVIDd:         4.40 cm LVIDs:         3.50 cm LV PW:         1.50 cm LV IVS:        1.90 cm LVOT diam:     2.50 cm LV SV:         49 LV SV Index:   22 LVOT Area:     4.91 cm  IVC IVC diam: 2.20 cm LEFT ATRIUM             Index       RIGHT ATRIUM           Index LA diam:        4.70 cm 2.12 cm/m  RA Area:     27.10 cm LA Vol (A2C):   67.7 ml 30.50 ml/m RA Volume:   81.50 ml  36.72 ml/m LA Vol (A4C):   78.9 ml 35.55 ml/m LA Biplane Vol: 76.9 ml 34.64 ml/m  AORTIC  VALVE AV Area (Vmax):    3.66 cm AV Area (Vmean):   3.58 cm AV Area (  VTI):     3.58 cm AV Vmax:           79.76 cm/s AV Vmean:          53.960 cm/s AV VTI:            0.138 m AV Peak Grad:      2.5 mmHg AV Mean Grad:      1.0 mmHg LVOT Vmax:         59.48 cm/s LVOT Vmean:        39.380 cm/s LVOT VTI:          0.101 m LVOT/AV VTI ratio: 0.73  AORTA Ao Root diam: 3.90 cm Ao Asc diam:  3.20 cm  SHUNTS Systemic VTI:  0.10 m Systemic Diam: 2.50 cm Fransico Him MD Electronically signed by Fransico Him MD Signature Date/Time: 08/28/2019/7:32:40 PM    Final (Updated)    IR PERCUTANEOUS ART THROMBECTOMY/INFUSION INTRACRANIAL INC DIAG ANGIO  Result Date: 08/31/2019 INDICATION: 86 year old male with past medical history significant for male with past medical history significant for A. fib, CHF, diabetes mellitus, hypertension and prostate cancer transferred with baseline modified Rankin scale 0. He presented with right-sided weakness and aphasia, NIHSS 20. He was last known well at 5 p.m. on 08/27/2019. Head CT showed no evidence of large acute infarct or hemorrhage. IV tPA none given due to history of rectal bleed and hepatic lesion. CT angiogram of the head and neck showed a left M1/MCA occlusion. He was taken to our service for a diagnostic cerebral angiogram and mechanical thrombectomy EXAM: IR CT HEAD LIMITED; IR PERCUTANEOUS ART THORMBECTOMY/INFUSION INTRACRANIAL INCLUDE DIAG ANGIO; IR ULTRASOUND GUIDANCE VASC ACCESS RIGHT COMPARISON:  CT/CT angiogram of the head and neck August 27, 2018 MEDICATIONS: No antibiotics given. ANESTHESIA/SEDATION: The procedure was performed under general anesthesia. FLUOROSCOPY TIME:  Fluoroscopy Time: 12 minutes 6 seconds (696.2 mGy). COMPLICATIONS: None immediate. TECHNIQUE: Informed written consent was obtained from the patient's wife after a thorough discussion of the procedural risks, benefits and alternatives. All questions were addressed. Maximal Sterile Barrier Technique was  utilized including caps, mask, sterile gowns, sterile gloves, sterile drape, hand hygiene and skin antiseptic. A timeout was performed prior to the initiation of the procedure. The right groin was prepped and draped in the usual sterile fashion. Using a micropuncture kit and the modified Seldinger technique, access was gained to the right common femoral artery and an 8 French Pinnacle sheath was placed. Attempted navigation through the sheath proved unsuccessful due to sheath kinking related to atherosclerotic disease and tortuosity. The sheath was exchanged over an Amplatzer wire for an 8 [place] arrow sheath. Under fluoroscopy, neuro max guide catheter was navigated over a 6 [place] VTK catheter and a 0.035" Terumo Glidewire into the aortic arch. The catheter was placed into the left common carotid artery. The inner catheter was removed. Frontal and lateral angiograms of the head and neck were obtained. FINDINGS: 1. Occlusion of the proximal left M1/MCA. 2. Good collateral flow from the left ACA to the left MCA territory. 3. No significant atherosclerotic disease or stenosis in the left carotid bifurcation. PROCEDURE: Under fluoroscopy, a Zoom 71 aspiration catheter was navigated over an Aristotle 24 soft microguidewire into the left M1/MCA segment. The guidewire was removed and the aspiration catheter was connected to an aspiration pump and advanced into the distal M1 segment. Continuous aspiration was performed for 4 minutes. The guide catheter was advanced into the upper cervical segment of the left ICA. The aspiration catheter was then  removed under aspiration. Follow-up left ICA angiograms with frontal lateral views of the head showed complete recanalization of the left MCA vascular tree. The catheter was subsequently withdrawn. Flat panel CT of the head was obtained and post processed in a separate workstation with concurrent attending physician supervision. Selected images were sent to PACS. No evidence of  hemorrhagic complication noted. Right common femoral artery angiograms were performed with frontal and lateral views showed diffuse atherosclerotic disease of the right external iliac and common femoral arteries without hemodynamically significant stenosis. Increased tortuosity of the right external iliac is noted. The femoral sheath was exchanged over the wire for an 8 [place] Angio-Seal which was utilized for access closure. Immediate hemostasis was achieved. IMPRESSION: Successful mechanical thrombectomy for treatment of a proximal left M1/MCA occlusion with direct contact aspiration technique. One pass performed with complete recanalization (TICI 3). No embolus to new or distal territory. No hemorrhage on postprocedural flat panel CT. PLAN: - ICU level of care - SBP 120-140 mmHg- Bed rest x6 hour. Electronically Signed   By: Pedro Earls M.D.   On: 08/31/2019 16:50   CT HEAD CODE STROKE WO CONTRAST`  Result Date: 08/27/2019 CLINICAL DATA:  Code stroke.  Acute neuro deficit. EXAM: CT HEAD WITHOUT CONTRAST TECHNIQUE: Contiguous axial images were obtained from the base of the skull through the vertex without intravenous contrast. COMPARISON:  CT head 08/11/2018 FINDINGS: Brain: Motion degraded study with repeat images Generalized atrophy. Prominent calcification in the globus pallidus bilaterally unchanged. Negative for acute infarct, hemorrhage, mass Vascular: Negative for hyperdense vessel Skull: Negative Sinuses/Orbits: Paranasal sinuses clear. Bilateral cataract extraction. Other: None ASPECTS ([facility] Stroke Program Early CT Score) - Ganglionic level infarction (caudate, lentiform nuclei, internal capsule, insula, M1-M3 cortex): 7 - Supraganglionic infarction (M4-M6 cortex): 3 Total score (0-10 with 10 being normal): 10 IMPRESSION: 1. No acute abnormality.  Generalized atrophy 2. ASPECTS is 10 3. These results were called by telephone at the time of interpretation on 08/27/2019 at 7:09 pm to  provider [facility] , who verbally acknowledged these results. Electronically Signed   By: Franchot Gallo M.D.   On: 08/27/2019 19:10   CT ANGIO HEAD CODE STROKE  Result Date: 08/27/2019 CLINICAL DATA:  Stroke.  Right-sided weakness. EXAM: CT ANGIOGRAPHY HEAD AND NECK CT PERFUSION BRAIN TECHNIQUE: Multidetector CT imaging of the head and neck was performed using the standard protocol during bolus administration of intravenous contrast. Multiplanar CT image reconstructions and MIPs were obtained to evaluate the vascular anatomy. Carotid stenosis measurements (when applicable) are obtained utilizing NASCET criteria, using the distal internal carotid diameter as the denominator. Multiphase CT imaging of the brain was performed following IV bolus contrast injection. Subsequent parametric perfusion maps were calculated using RAPID software. CONTRAST:  145m OMNIPAQUE IOHEXOL 350 MG/ML SOLN COMPARISON:  CT head 08/27/2019 FINDINGS: CTA NECK FINDINGS Aortic arch: Mild atherosclerotic disease in the aortic arch. Bovine branching pattern. Proximal great vessels patent without stenosis. Right carotid system: Right carotid widely patent without stenosis or atherosclerotic disease. Motion degrades image quality. Left carotid system: Left carotid widely patent without stenosis or atherosclerotic disease. Vertebral arteries: Left vertebral artery is dominant and patent to the basilar. Mild stenosis at the origin. Right vertebral artery is occluded at the origin and reconstitutes at the C3-4 level as a small vessel which appears to be patent to the basilar. Skeleton: Cervical spondylosis moderate in degree. No acute skeletal abnormality. Other neck: Negative for mass or adenopathy. Upper chest: Lung apices clear bilaterally. Review  of the MIP images confirms the above findings CTA HEAD FINDINGS Anterior circulation: Occlusion left M1 segment appears acute. There is opacification of the left M2 and M3 branches due to  collateral circulation. Mild atherosclerotic calcification right cavernous carotid without significant stenosis. Left cavernous carotid widely patent. Atherosclerotic irregularity in the anterior cerebral arteries bilaterally which is relatively severe. Atherosclerotic irregularity in right middle cerebral artery branches without large vessel occlusion. Posterior circulation: Left vertebral artery is patent. There appears to be a severe stenosis or possibly segmental occlusion of the V4 segment on the right. Basilar is patent. Superior cerebellar and posterior cerebral arteries patent bilaterally. Venous sinuses: Normal venous enhancement. Anatomic variants: None Review of the MIP images confirms the above findings CT Brain Perfusion Findings: ASPECTS: 10 CBF (<30%) Volume: 28m Perfusion (Tmax>6.0s) volume: 939mMismatch Volume: 9865mnfarction Location:Left MCA territory IMPRESSION: 1. CT perfusion demonstrates 98 mL of delayed perfusion left MCA territory without core infarct. Aspects 10. 2. Occlusion left M1 segment which appears acute. There is collateral circulation to left MCA branches. 3. Carotid artery in the neck is normal bilaterally 4. Left vertebral artery is patent without significant stenosis 5. Occlusion of the proximal half of the right vertebral artery with additional high-grade stenosis or occlusion right V4 segment. 6. Intracranial atherosclerotic disease. 7. These results were called by telephone at the time of interpretation on 08/27/2019 at 7:50 Pm to [facility] these results. Electronically Signed   By: ChaFranchot GalloD.   On: 08/27/2019 20:01   CT ANGIO NECK CODE STROKE  Result Date: 08/27/2019 CLINICAL DATA:  Stroke.  Right-sided weakness. EXAM: CT ANGIOGRAPHY HEAD AND NECK CT PERFUSION BRAIN TECHNIQUE: Multidetector CT imaging of the head and neck was performed using the standard protocol during bolus administration of intravenous contrast.  Multiplanar CT image reconstructions and MIPs were obtained to evaluate the vascular anatomy. Carotid stenosis measurements (when applicable) are obtained utilizing NASCET criteria, using the distal internal carotid diameter as the denominator. Multiphase CT imaging of the brain was performed following IV bolus contrast injection. Subsequent parametric perfusion maps were calculated using RAPID software. CONTRAST:  100m61mNIPAQUE IOHEXOL 350 MG/ML SOLN COMPARISON:  CT head 08/27/2019 FINDINGS: CTA NECK FINDINGS Aortic arch: Mild atherosclerotic disease in the aortic arch. Bovine branching pattern. Proximal great vessels patent without stenosis. Right carotid system: Right carotid widely patent without stenosis or atherosclerotic disease. Motion degrades image quality. Left carotid system: Left carotid widely patent without stenosis or atherosclerotic disease. Vertebral arteries: Left vertebral artery is dominant and patent to the basilar. Mild stenosis at the origin. Right vertebral artery is occluded at the origin and reconstitutes at the C3-4 level as a small vessel which appears to be patent to the basilar. Skeleton: Cervical spondylosis moderate in degree. No acute skeletal abnormality. Other neck: Negative for mass or adenopathy. Upper chest: Lung apices clear bilaterally. Review of the MIP images confirms the above findings CTA HEAD FINDINGS Anterior circulation: Occlusion left M1 segment appears acute. There is opacification of the left M2 and M3 branches due to collateral circulation. Mild atherosclerotic calcification right cavernous carotid without significant stenosis. Left cavernous carotid widely patent. Atherosclerotic irregularity in the anterior cerebral arteries bilaterally which is relatively severe. Atherosclerotic irregularity in right middle cerebral artery branches without large vessel occlusion. Posterior circulation: Left vertebral artery is patent. There appears to be a severe stenosis or  possibly segmental occlusion of the V4 segment on the right. Basilar is patent. Superior cerebellar and posterior  cerebral arteries patent bilaterally. Venous sinuses: Normal venous enhancement. Anatomic variants: None Review of the MIP images confirms the above findings CT Brain Perfusion Findings: ASPECTS: 10 CBF (<30%) Volume: 69m Perfusion (Tmax>6.0s) volume: 975mMismatch Volume: 9859mnfarction Location:Left MCA territory IMPRESSION: 1. CT perfusion demonstrates 98 mL of delayed perfusion left MCA territory without core infarct. Aspects 10. 2. Occlusion left M1 segment which appears acute. There is collateral circulation to left MCA branches. 3. Carotid artery in the neck is normal bilaterally 4. Left vertebral artery is patent without significant stenosis 5. Occlusion of the proximal half of the right vertebral artery with additional high-grade stenosis or occlusion right V4 segment. 6. Intracranial atherosclerotic disease. 7. These results were called by telephone at the time of interpretation on 08/27/2019 at 7:50 Pm to provider [facility]who verbally acknowledged these results. Electronically Signed   By: ChaFranchot GalloD.   On: 08/27/2019 20:01      ASSESSMENT & PLAN:  1. Prostate cancer (HCCPotter 2. Androgen deprivation therapy   3. Cerebrovascular accident (CVA), unspecified mechanism (HCCFountain Cancer Staging Prostate cancer (HCCSalmon Creektaging form: Prostate, AJCC 8th Edition - Clinical: Stage IVA (cTX, cN1, cM0, PSA: 49, Grade Group: 2) - Signed by Arelys Glassco,Earlie ServerD on 09/16/2019   Prostate cancer recurrence.  Axumin PET was reviewed and discussed with patient, compatible with recurrent prostate cancer.  Repeat biopsy was discussed and patient was not interested. He also has multiple other medical problems, which also delay/limit biopsy.  He tolerates FirMills Kollereatment.  PSA has responded well to the treatment. Proceed with FirMills Kollerday.  Given his recent episodes of acute CVA, plan of  resuming of anticoagulation in the context of history of GI bleeding.  I will hold off adding oral agents to his prostate cancer regimen. Patient will follow up in 4 weeks for further discussion.  Patient and his wife agree with the plan.  All questions were answered. The patient knows to call the clinic with any problems questions or concerns.  Return of visit: 4 weeks  ZhoEarlie ServerD, PhD Hematology Oncology [facility] Pager- [phone]

## 2019-09-16 NOTE — Progress Notes (Signed)
Patient was diagnosed with GI bleed and was advised to stop the Eliquis.  He has recently been admitted for stroke.

## 2019-09-17 ENCOUNTER — Telehealth: Payer: Self-pay | Admitting: *Deleted

## 2019-09-17 DIAGNOSIS — I0981 Rheumatic heart failure: Secondary | ICD-10-CM | POA: Diagnosis not present

## 2019-09-17 DIAGNOSIS — E1122 Type 2 diabetes mellitus with diabetic chronic kidney disease: Secondary | ICD-10-CM | POA: Diagnosis not present

## 2019-09-17 DIAGNOSIS — I69320 Aphasia following cerebral infarction: Secondary | ICD-10-CM | POA: Diagnosis not present

## 2019-09-17 DIAGNOSIS — G319 Degenerative disease of nervous system, unspecified: Secondary | ICD-10-CM | POA: Diagnosis not present

## 2019-09-17 DIAGNOSIS — M2578 Osteophyte, vertebrae: Secondary | ICD-10-CM | POA: Diagnosis not present

## 2019-09-17 DIAGNOSIS — I69322 Dysarthria following cerebral infarction: Secondary | ICD-10-CM | POA: Diagnosis not present

## 2019-09-17 DIAGNOSIS — I69351 Hemiplegia and hemiparesis following cerebral infarction affecting right dominant side: Secondary | ICD-10-CM | POA: Diagnosis not present

## 2019-09-17 DIAGNOSIS — Z87891 Personal history of nicotine dependence: Secondary | ICD-10-CM | POA: Diagnosis not present

## 2019-09-17 DIAGNOSIS — I509 Heart failure, unspecified: Secondary | ICD-10-CM | POA: Diagnosis not present

## 2019-09-17 DIAGNOSIS — Z7982 Long term (current) use of aspirin: Secondary | ICD-10-CM | POA: Diagnosis not present

## 2019-09-17 DIAGNOSIS — I69392 Facial weakness following cerebral infarction: Secondary | ICD-10-CM | POA: Diagnosis not present

## 2019-09-17 DIAGNOSIS — J9811 Atelectasis: Secondary | ICD-10-CM | POA: Diagnosis not present

## 2019-09-17 DIAGNOSIS — Z9181 History of falling: Secondary | ICD-10-CM | POA: Diagnosis not present

## 2019-09-17 DIAGNOSIS — M4802 Spinal stenosis, cervical region: Secondary | ICD-10-CM | POA: Diagnosis not present

## 2019-09-17 DIAGNOSIS — M47812 Spondylosis without myelopathy or radiculopathy, cervical region: Secondary | ICD-10-CM | POA: Diagnosis not present

## 2019-09-17 DIAGNOSIS — E785 Hyperlipidemia, unspecified: Secondary | ICD-10-CM | POA: Diagnosis not present

## 2019-09-17 DIAGNOSIS — Z9981 Dependence on supplemental oxygen: Secondary | ICD-10-CM | POA: Diagnosis not present

## 2019-09-17 DIAGNOSIS — N1832 Chronic kidney disease, stage 3b: Secondary | ICD-10-CM | POA: Diagnosis not present

## 2019-09-17 DIAGNOSIS — I480 Paroxysmal atrial fibrillation: Secondary | ICD-10-CM | POA: Diagnosis not present

## 2019-09-17 DIAGNOSIS — I130 Hypertensive heart and chronic kidney disease with heart failure and stage 1 through stage 4 chronic kidney disease, or unspecified chronic kidney disease: Secondary | ICD-10-CM | POA: Diagnosis not present

## 2019-09-17 DIAGNOSIS — N200 Calculus of kidney: Secondary | ICD-10-CM | POA: Diagnosis not present

## 2019-09-17 DIAGNOSIS — I088 Other rheumatic multiple valve diseases: Secondary | ICD-10-CM | POA: Diagnosis not present

## 2019-09-17 DIAGNOSIS — I700 Atherosclerosis of aorta: Secondary | ICD-10-CM | POA: Diagnosis not present

## 2019-09-17 DIAGNOSIS — H9190 Unspecified hearing loss, unspecified ear: Secondary | ICD-10-CM | POA: Diagnosis not present

## 2019-09-17 NOTE — Telephone Encounter (Signed)
Patient wanted to confirm that it was OK to take B12 with other meds

## 2019-09-17 NOTE — Telephone Encounter (Signed)
Wife called requesting a return call to discuss patient B12 prescription that was ordered and to let Dr  Know that patient cardiologist restarted his Eliquis

## 2019-09-17 NOTE — Telephone Encounter (Signed)
Yes. Ok to start B12

## 2019-09-20 NOTE — Telephone Encounter (Signed)
Call returned to [name] and advised ok to start B 12

## 2019-09-21 ENCOUNTER — Other Ambulatory Visit: Payer: Self-pay | Admitting: *Deleted
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
## 2019-09-22 DIAGNOSIS — E1122 Type 2 diabetes mellitus with diabetic chronic kidney disease: Secondary | ICD-10-CM | POA: Diagnosis not present

## 2019-09-22 DIAGNOSIS — G319 Degenerative disease of nervous system, unspecified: Secondary | ICD-10-CM | POA: Diagnosis not present

## 2019-09-22 DIAGNOSIS — Z9181 History of falling: Secondary | ICD-10-CM | POA: Diagnosis not present

## 2019-09-22 DIAGNOSIS — I700 Atherosclerosis of aorta: Secondary | ICD-10-CM | POA: Diagnosis not present

## 2019-09-22 DIAGNOSIS — M2578 Osteophyte, vertebrae: Secondary | ICD-10-CM | POA: Diagnosis not present

## 2019-09-22 DIAGNOSIS — M47812 Spondylosis without myelopathy or radiculopathy, cervical region: Secondary | ICD-10-CM | POA: Diagnosis not present

## 2019-09-22 DIAGNOSIS — I69351 Hemiplegia and hemiparesis following cerebral infarction affecting right dominant side: Secondary | ICD-10-CM | POA: Diagnosis not present

## 2019-09-22 DIAGNOSIS — I69322 Dysarthria following cerebral infarction: Secondary | ICD-10-CM | POA: Diagnosis not present

## 2019-09-22 DIAGNOSIS — Z9981 Dependence on supplemental oxygen: Secondary | ICD-10-CM | POA: Diagnosis not present

## 2019-09-22 DIAGNOSIS — I69392 Facial weakness following cerebral infarction: Secondary | ICD-10-CM | POA: Diagnosis not present

## 2019-09-22 DIAGNOSIS — N200 Calculus of kidney: Secondary | ICD-10-CM | POA: Diagnosis not present

## 2019-09-22 DIAGNOSIS — I130 Hypertensive heart and chronic kidney disease with heart failure and stage 1 through stage 4 chronic kidney disease, or unspecified chronic kidney disease: Secondary | ICD-10-CM | POA: Diagnosis not present

## 2019-09-22 DIAGNOSIS — N1832 Chronic kidney disease, stage 3b: Secondary | ICD-10-CM | POA: Diagnosis not present

## 2019-09-22 DIAGNOSIS — J9811 Atelectasis: Secondary | ICD-10-CM | POA: Diagnosis not present

## 2019-09-22 DIAGNOSIS — I0981 Rheumatic heart failure: Secondary | ICD-10-CM | POA: Diagnosis not present

## 2019-09-22 DIAGNOSIS — I69320 Aphasia following cerebral infarction: Secondary | ICD-10-CM | POA: Diagnosis not present

## 2019-09-22 DIAGNOSIS — Z87891 Personal history of nicotine dependence: Secondary | ICD-10-CM | POA: Diagnosis not present

## 2019-09-22 DIAGNOSIS — I509 Heart failure, unspecified: Secondary | ICD-10-CM | POA: Diagnosis not present

## 2019-09-22 DIAGNOSIS — I088 Other rheumatic multiple valve diseases: Secondary | ICD-10-CM | POA: Diagnosis not present

## 2019-09-22 DIAGNOSIS — M4802 Spinal stenosis, cervical region: Secondary | ICD-10-CM | POA: Diagnosis not present

## 2019-09-22 DIAGNOSIS — E785 Hyperlipidemia, unspecified: Secondary | ICD-10-CM | POA: Diagnosis not present

## 2019-09-22 DIAGNOSIS — H9190 Unspecified hearing loss, unspecified ear: Secondary | ICD-10-CM | POA: Diagnosis not present

## 2019-09-22 DIAGNOSIS — Z7982 Long term (current) use of aspirin: Secondary | ICD-10-CM | POA: Diagnosis not present

## 2019-09-22 DIAGNOSIS — I480 Paroxysmal atrial fibrillation: Secondary | ICD-10-CM | POA: Diagnosis not present

## 2019-09-23 ENCOUNTER — Ambulatory Visit
Admission: RE | Admit: 2019-09-23 | Discharge: 2019-09-23 | Disposition: A | Discharge status: Home / Self Care | Payer: Medicare Other | Source: Ambulatory Visit | Attending: Radiation Oncology | Admitting: Radiation Oncology

## 2019-09-23 ENCOUNTER — Other Ambulatory Visit: Payer: Self-pay

## 2019-09-23 ENCOUNTER — Encounter: Payer: Self-pay | Admitting: Radiation Oncology

## 2019-09-23 VITALS — BP 111/75 | HR 79 | Temp 97.6°F | Wt 234.6 lb

## 2019-09-23 DIAGNOSIS — C61 Malignant neoplasm of prostate: Secondary | ICD-10-CM

## 2019-09-24 ENCOUNTER — Ambulatory Visit: Payer: Self-pay | Admitting: *Deleted

## 2019-10-01 DIAGNOSIS — I088 Other rheumatic multiple valve diseases: Secondary | ICD-10-CM | POA: Diagnosis not present

## 2019-10-01 DIAGNOSIS — Z87891 Personal history of nicotine dependence: Secondary | ICD-10-CM | POA: Diagnosis not present

## 2019-10-01 DIAGNOSIS — I69392 Facial weakness following cerebral infarction: Secondary | ICD-10-CM | POA: Diagnosis not present

## 2019-10-01 DIAGNOSIS — N200 Calculus of kidney: Secondary | ICD-10-CM | POA: Diagnosis not present

## 2019-10-01 DIAGNOSIS — Z7982 Long term (current) use of aspirin: Secondary | ICD-10-CM | POA: Diagnosis not present

## 2019-10-01 DIAGNOSIS — I69322 Dysarthria following cerebral infarction: Secondary | ICD-10-CM | POA: Diagnosis not present

## 2019-10-01 DIAGNOSIS — I509 Heart failure, unspecified: Secondary | ICD-10-CM | POA: Diagnosis not present

## 2019-10-01 DIAGNOSIS — N1832 Chronic kidney disease, stage 3b: Secondary | ICD-10-CM | POA: Diagnosis not present

## 2019-10-01 DIAGNOSIS — G319 Degenerative disease of nervous system, unspecified: Secondary | ICD-10-CM | POA: Diagnosis not present

## 2019-10-01 DIAGNOSIS — H9190 Unspecified hearing loss, unspecified ear: Secondary | ICD-10-CM | POA: Diagnosis not present

## 2019-10-01 DIAGNOSIS — J9811 Atelectasis: Secondary | ICD-10-CM | POA: Diagnosis not present

## 2019-10-01 DIAGNOSIS — M47812 Spondylosis without myelopathy or radiculopathy, cervical region: Secondary | ICD-10-CM | POA: Diagnosis not present

## 2019-10-01 DIAGNOSIS — I0981 Rheumatic heart failure: Secondary | ICD-10-CM | POA: Diagnosis not present

## 2019-10-01 DIAGNOSIS — E785 Hyperlipidemia, unspecified: Secondary | ICD-10-CM | POA: Diagnosis not present

## 2019-10-01 DIAGNOSIS — I130 Hypertensive heart and chronic kidney disease with heart failure and stage 1 through stage 4 chronic kidney disease, or unspecified chronic kidney disease: Secondary | ICD-10-CM | POA: Diagnosis not present

## 2019-10-01 DIAGNOSIS — I69320 Aphasia following cerebral infarction: Secondary | ICD-10-CM | POA: Diagnosis not present

## 2019-10-01 DIAGNOSIS — I69351 Hemiplegia and hemiparesis following cerebral infarction affecting right dominant side: Secondary | ICD-10-CM | POA: Diagnosis not present

## 2019-10-01 DIAGNOSIS — Z9181 History of falling: Secondary | ICD-10-CM | POA: Diagnosis not present

## 2019-10-01 DIAGNOSIS — Z9981 Dependence on supplemental oxygen: Secondary | ICD-10-CM | POA: Diagnosis not present

## 2019-10-01 DIAGNOSIS — M2578 Osteophyte, vertebrae: Secondary | ICD-10-CM | POA: Diagnosis not present

## 2019-10-01 DIAGNOSIS — E1122 Type 2 diabetes mellitus with diabetic chronic kidney disease: Secondary | ICD-10-CM | POA: Diagnosis not present

## 2019-10-01 DIAGNOSIS — I480 Paroxysmal atrial fibrillation: Secondary | ICD-10-CM | POA: Diagnosis not present

## 2019-10-01 DIAGNOSIS — I700 Atherosclerosis of aorta: Secondary | ICD-10-CM | POA: Diagnosis not present

## 2019-10-01 DIAGNOSIS — M4802 Spinal stenosis, cervical region: Secondary | ICD-10-CM | POA: Diagnosis not present

## 2019-10-04 ENCOUNTER — Other Ambulatory Visit: Payer: Self-pay

## 2019-10-04 MED ORDER — PANTOPRAZOLE SODIUM 40 MG PO TBEC
40.0000 mg | DELAYED_RELEASE_TABLET | Freq: Every day | ORAL | 1 refills | Status: DC
Start: 2019-10-04 — End: 2020-12-08

## 2019-10-04 MED ORDER — ATORVASTATIN CALCIUM 10 MG PO TABS
10.0000 mg | ORAL_TABLET | Freq: Every day | ORAL | 0 refills | Status: DC
Start: 2019-10-04 — End: 2020-08-18

## 2019-10-04 MED ORDER — ATORVASTATIN CALCIUM 10 MG PO TABS
10.0000 mg | ORAL_TABLET | Freq: Every day | ORAL | 0 refills | Status: DC
Start: 2019-10-04 — End: 2019-10-04

## 2019-10-06 DIAGNOSIS — Z87891 Personal history of nicotine dependence: Secondary | ICD-10-CM | POA: Diagnosis not present

## 2019-10-06 DIAGNOSIS — M4802 Spinal stenosis, cervical region: Secondary | ICD-10-CM | POA: Diagnosis not present

## 2019-10-06 DIAGNOSIS — Z9981 Dependence on supplemental oxygen: Secondary | ICD-10-CM | POA: Diagnosis not present

## 2019-10-06 DIAGNOSIS — H9190 Unspecified hearing loss, unspecified ear: Secondary | ICD-10-CM | POA: Diagnosis not present

## 2019-10-06 DIAGNOSIS — I130 Hypertensive heart and chronic kidney disease with heart failure and stage 1 through stage 4 chronic kidney disease, or unspecified chronic kidney disease: Secondary | ICD-10-CM | POA: Diagnosis not present

## 2019-10-06 DIAGNOSIS — I088 Other rheumatic multiple valve diseases: Secondary | ICD-10-CM | POA: Diagnosis not present

## 2019-10-06 DIAGNOSIS — I69322 Dysarthria following cerebral infarction: Secondary | ICD-10-CM | POA: Diagnosis not present

## 2019-10-06 DIAGNOSIS — I0981 Rheumatic heart failure: Secondary | ICD-10-CM | POA: Diagnosis not present

## 2019-10-06 DIAGNOSIS — M47812 Spondylosis without myelopathy or radiculopathy, cervical region: Secondary | ICD-10-CM | POA: Diagnosis not present

## 2019-10-06 DIAGNOSIS — M2578 Osteophyte, vertebrae: Secondary | ICD-10-CM | POA: Diagnosis not present

## 2019-10-06 DIAGNOSIS — Z7982 Long term (current) use of aspirin: Secondary | ICD-10-CM | POA: Diagnosis not present

## 2019-10-06 DIAGNOSIS — I69392 Facial weakness following cerebral infarction: Secondary | ICD-10-CM | POA: Diagnosis not present

## 2019-10-06 DIAGNOSIS — J9811 Atelectasis: Secondary | ICD-10-CM | POA: Diagnosis not present

## 2019-10-06 DIAGNOSIS — E1122 Type 2 diabetes mellitus with diabetic chronic kidney disease: Secondary | ICD-10-CM | POA: Diagnosis not present

## 2019-10-06 DIAGNOSIS — I69351 Hemiplegia and hemiparesis following cerebral infarction affecting right dominant side: Secondary | ICD-10-CM | POA: Diagnosis not present

## 2019-10-06 DIAGNOSIS — I69320 Aphasia following cerebral infarction: Secondary | ICD-10-CM | POA: Diagnosis not present

## 2019-10-06 DIAGNOSIS — I700 Atherosclerosis of aorta: Secondary | ICD-10-CM | POA: Diagnosis not present

## 2019-10-06 DIAGNOSIS — N200 Calculus of kidney: Secondary | ICD-10-CM | POA: Diagnosis not present

## 2019-10-06 DIAGNOSIS — I509 Heart failure, unspecified: Secondary | ICD-10-CM | POA: Diagnosis not present

## 2019-10-06 DIAGNOSIS — N1832 Chronic kidney disease, stage 3b: Secondary | ICD-10-CM | POA: Diagnosis not present

## 2019-10-06 DIAGNOSIS — Z9181 History of falling: Secondary | ICD-10-CM | POA: Diagnosis not present

## 2019-10-06 DIAGNOSIS — E785 Hyperlipidemia, unspecified: Secondary | ICD-10-CM | POA: Diagnosis not present

## 2019-10-06 DIAGNOSIS — G319 Degenerative disease of nervous system, unspecified: Secondary | ICD-10-CM | POA: Diagnosis not present

## 2019-10-06 DIAGNOSIS — I480 Paroxysmal atrial fibrillation: Secondary | ICD-10-CM | POA: Diagnosis not present

## 2019-10-12 ENCOUNTER — Inpatient Hospital Stay: Payer: Medicare Other | Attending: Oncology

## 2019-10-12 ENCOUNTER — Other Ambulatory Visit: Payer: Self-pay

## 2019-10-12 DIAGNOSIS — Z87442 Personal history of urinary calculi: Secondary | ICD-10-CM | POA: Insufficient documentation

## 2019-10-12 DIAGNOSIS — R05 Cough: Secondary | ICD-10-CM | POA: Diagnosis not present

## 2019-10-12 DIAGNOSIS — Z79899 Other long term (current) drug therapy: Secondary | ICD-10-CM | POA: Insufficient documentation

## 2019-10-12 DIAGNOSIS — Z7901 Long term (current) use of anticoagulants: Secondary | ICD-10-CM | POA: Insufficient documentation

## 2019-10-12 DIAGNOSIS — Z87891 Personal history of nicotine dependence: Secondary | ICD-10-CM | POA: Insufficient documentation

## 2019-10-12 DIAGNOSIS — Z800 Family history of malignant neoplasm of digestive organs: Secondary | ICD-10-CM | POA: Diagnosis not present

## 2019-10-12 DIAGNOSIS — Z9079 Acquired absence of other genital organ(s): Secondary | ICD-10-CM | POA: Diagnosis not present

## 2019-10-12 DIAGNOSIS — K625 Hemorrhage of anus and rectum: Secondary | ICD-10-CM | POA: Diagnosis not present

## 2019-10-12 DIAGNOSIS — R531 Weakness: Secondary | ICD-10-CM | POA: Insufficient documentation

## 2019-10-12 DIAGNOSIS — Z8042 Family history of malignant neoplasm of prostate: Secondary | ICD-10-CM | POA: Insufficient documentation

## 2019-10-12 DIAGNOSIS — Z833 Family history of diabetes mellitus: Secondary | ICD-10-CM | POA: Diagnosis not present

## 2019-10-12 DIAGNOSIS — I509 Heart failure, unspecified: Secondary | ICD-10-CM | POA: Diagnosis not present

## 2019-10-12 DIAGNOSIS — R918 Other nonspecific abnormal finding of lung field: Secondary | ICD-10-CM | POA: Insufficient documentation

## 2019-10-12 DIAGNOSIS — I4891 Unspecified atrial fibrillation: Secondary | ICD-10-CM | POA: Insufficient documentation

## 2019-10-12 DIAGNOSIS — C772 Secondary and unspecified malignant neoplasm of intra-abdominal lymph nodes: Secondary | ICD-10-CM | POA: Diagnosis not present

## 2019-10-12 DIAGNOSIS — E119 Type 2 diabetes mellitus without complications: Secondary | ICD-10-CM | POA: Insufficient documentation

## 2019-10-12 DIAGNOSIS — C61 Malignant neoplasm of prostate: Secondary | ICD-10-CM | POA: Diagnosis present

## 2019-10-12 DIAGNOSIS — Z5111 Encounter for antineoplastic chemotherapy: Secondary | ICD-10-CM | POA: Insufficient documentation

## 2019-10-12 DIAGNOSIS — Z8673 Personal history of transient ischemic attack (TIA), and cerebral infarction without residual deficits: Secondary | ICD-10-CM | POA: Insufficient documentation

## 2019-10-12 LAB — COMPREHENSIVE METABOLIC PANEL
ALT: 32 U/L (ref 0–44)
AST: 26 U/L (ref 15–41)
Albumin: 3.5 g/dL (ref 3.5–5.0)
Alkaline Phosphatase: 124 U/L (ref 38–126)
Anion gap: 8 (ref 5–15)
BUN: 42 mg/dL — ABNORMAL HIGH (ref 8–23)
CO2: 27 mmol/L (ref 22–32)
Calcium: 9.1 mg/dL (ref 8.9–10.3)
Chloride: 105 mmol/L (ref 98–111)
Creatinine, Ser: 2.39 mg/dL — ABNORMAL HIGH (ref 0.61–1.24)
GFR calc Af Amer: 28 mL/min — ABNORMAL LOW (ref >60)
GFR calc non Af Amer: 24 mL/min — ABNORMAL LOW (ref >60)
Glucose, Bld: 276 mg/dL — ABNORMAL HIGH (ref 70–99)
Potassium: 4.8 mmol/L (ref 3.5–5.1)
Sodium: 140 mmol/L (ref 135–145)
Total Bilirubin: 0.4 mg/dL (ref 0.3–1.2)
Total Protein: 7.8 g/dL (ref 6.5–8.1)

## 2019-10-12 LAB — CBC WITH DIFFERENTIAL/PLATELET
Abs Immature Granulocytes: 0.02 10*3/uL (ref 0.00–0.07)
Basophils Absolute: 0 10*3/uL (ref 0.0–0.1)
Basophils Relative: 0 %
Eosinophils Absolute: 0.2 10*3/uL (ref 0.0–0.5)
Eosinophils Relative: 3 %
HCT: 44.6 % (ref 39.0–52.0)
Hemoglobin: 14.1 g/dL (ref 13.0–17.0)
Immature Granulocytes: 0 %
Lymphocytes Relative: 23 %
Lymphs Abs: 1.4 10*3/uL (ref 0.7–4.0)
MCH: 28 pg (ref 26.0–34.0)
MCHC: 31.6 g/dL (ref 30.0–36.0)
MCV: 88.7 fL (ref 80.0–100.0)
Monocytes Absolute: 0.7 10*3/uL (ref 0.1–1.0)
Monocytes Relative: 11 %
Neutro Abs: 3.8 10*3/uL (ref 1.7–7.7)
Neutrophils Relative %: 63 %
Platelets: 207 10*3/uL (ref 150–400)
RBC: 5.03 MIL/uL (ref 4.22–5.81)
RDW: 14.7 % (ref 11.5–15.5)
WBC: 6.1 10*3/uL (ref 4.0–10.5)
nRBC: 0 % (ref 0.0–0.2)

## 2019-10-12 LAB — PSA: Prostatic Specific Antigen: 17.68 ng/mL — ABNORMAL HIGH (ref 0.00–4.00)

## 2019-10-13 ENCOUNTER — Inpatient Hospital Stay: Payer: Medicare Other | Admitting: Oncology

## 2019-10-13 ENCOUNTER — Inpatient Hospital Stay (HOSPITAL_BASED_OUTPATIENT_CLINIC_OR_DEPARTMENT_OTHER): Payer: Medicare Other | Admitting: Oncology

## 2019-10-13 ENCOUNTER — Telehealth: Payer: Self-pay | Admitting: Oncology

## 2019-10-13 ENCOUNTER — Encounter: Payer: Self-pay | Admitting: Oncology

## 2019-10-13 ENCOUNTER — Inpatient Hospital Stay: Payer: Medicare Other

## 2019-10-13 ENCOUNTER — Other Ambulatory Visit: Payer: Self-pay

## 2019-10-13 VITALS — BP 119/82 | HR 86 | Temp 96.8°F | Resp 18 | Wt 239.6 lb

## 2019-10-13 DIAGNOSIS — Z87442 Personal history of urinary calculi: Secondary | ICD-10-CM | POA: Diagnosis not present

## 2019-10-13 DIAGNOSIS — R590 Localized enlarged lymph nodes: Secondary | ICD-10-CM | POA: Diagnosis not present

## 2019-10-13 DIAGNOSIS — Z7901 Long term (current) use of anticoagulants: Secondary | ICD-10-CM | POA: Diagnosis not present

## 2019-10-13 DIAGNOSIS — C772 Secondary and unspecified malignant neoplasm of intra-abdominal lymph nodes: Secondary | ICD-10-CM | POA: Diagnosis not present

## 2019-10-13 DIAGNOSIS — I509 Heart failure, unspecified: Secondary | ICD-10-CM | POA: Diagnosis not present

## 2019-10-13 DIAGNOSIS — Z87891 Personal history of nicotine dependence: Secondary | ICD-10-CM | POA: Diagnosis not present

## 2019-10-13 DIAGNOSIS — Z8673 Personal history of transient ischemic attack (TIA), and cerebral infarction without residual deficits: Secondary | ICD-10-CM | POA: Diagnosis not present

## 2019-10-13 DIAGNOSIS — R918 Other nonspecific abnormal finding of lung field: Secondary | ICD-10-CM | POA: Diagnosis not present

## 2019-10-13 DIAGNOSIS — Z79818 Long term (current) use of other agents affecting estrogen receptors and estrogen levels: Secondary | ICD-10-CM | POA: Diagnosis not present

## 2019-10-13 DIAGNOSIS — C61 Malignant neoplasm of prostate: Secondary | ICD-10-CM

## 2019-10-13 DIAGNOSIS — K625 Hemorrhage of anus and rectum: Secondary | ICD-10-CM | POA: Diagnosis not present

## 2019-10-13 DIAGNOSIS — E119 Type 2 diabetes mellitus without complications: Secondary | ICD-10-CM | POA: Diagnosis not present

## 2019-10-13 DIAGNOSIS — R05 Cough: Secondary | ICD-10-CM | POA: Diagnosis not present

## 2019-10-13 DIAGNOSIS — Z5111 Encounter for antineoplastic chemotherapy: Secondary | ICD-10-CM | POA: Diagnosis not present

## 2019-10-13 DIAGNOSIS — R531 Weakness: Secondary | ICD-10-CM | POA: Diagnosis not present

## 2019-10-13 DIAGNOSIS — Z79899 Other long term (current) drug therapy: Secondary | ICD-10-CM | POA: Diagnosis not present

## 2019-10-13 DIAGNOSIS — Z833 Family history of diabetes mellitus: Secondary | ICD-10-CM | POA: Diagnosis not present

## 2019-10-13 DIAGNOSIS — I4891 Unspecified atrial fibrillation: Secondary | ICD-10-CM | POA: Diagnosis not present

## 2019-10-13 DIAGNOSIS — Z800 Family history of malignant neoplasm of digestive organs: Secondary | ICD-10-CM | POA: Diagnosis not present

## 2019-10-13 MED ORDER — DEGARELIX ACETATE 80 MG ~~LOC~~ SOLR
80.0000 mg | Freq: Once | SUBCUTANEOUS | Status: AC
  Administered 2019-10-13: 80 mg via SUBCUTANEOUS
  Filled 2019-10-13: qty 4
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
## 2019-10-13 NOTE — Progress Notes (Signed)
Pt here for follow up. No new concerns voiced.   

## 2019-10-13 NOTE — Telephone Encounter (Signed)
Patient's wife phoned and stated that she needed to reschedule patient's appt due to the weather. Appts rescheduled to 10-15-19.

## 2019-10-13 NOTE — Telephone Encounter (Signed)
Patient's wife later phoned back and moved appt back to today. Appts rescheduled for this date.

## 2019-10-15 ENCOUNTER — Other Ambulatory Visit: Payer: Self-pay

## 2019-10-15 ENCOUNTER — Telehealth: Payer: Self-pay

## 2019-10-15 ENCOUNTER — Inpatient Hospital Stay: Payer: Medicare Other

## 2019-10-15 ENCOUNTER — Encounter: Payer: Self-pay | Admitting: Nurse Practitioner

## 2019-10-15 ENCOUNTER — Ambulatory Visit (INDEPENDENT_AMBULATORY_CARE_PROVIDER_SITE_OTHER): Payer: Medicare Other | Admitting: Nurse Practitioner

## 2019-10-15 ENCOUNTER — Inpatient Hospital Stay: Payer: Medicare Other | Admitting: Oncology

## 2019-10-15 VITALS — BP 119/65 | HR 53 | Temp 95.7°F | Resp 16 | Ht 72.0 in | Wt 241.4 lb

## 2019-10-15 DIAGNOSIS — Z0001 Encounter for general adult medical examination with abnormal findings: Secondary | ICD-10-CM

## 2019-10-15 DIAGNOSIS — R140 Abdominal distension (gaseous): Secondary | ICD-10-CM | POA: Diagnosis not present

## 2019-10-15 DIAGNOSIS — I10 Essential (primary) hypertension: Secondary | ICD-10-CM | POA: Diagnosis not present

## 2019-10-15 DIAGNOSIS — E1122 Type 2 diabetes mellitus with diabetic chronic kidney disease: Secondary | ICD-10-CM

## 2019-10-15 DIAGNOSIS — E1121 Type 2 diabetes mellitus with diabetic nephropathy: Secondary | ICD-10-CM

## 2019-10-15 DIAGNOSIS — I700 Atherosclerosis of aorta: Secondary | ICD-10-CM

## 2019-10-15 DIAGNOSIS — R300 Dysuria: Secondary | ICD-10-CM

## 2019-10-15 DIAGNOSIS — N1832 Chronic kidney disease, stage 3b: Secondary | ICD-10-CM

## 2019-10-15 NOTE — Telephone Encounter (Signed)
Plan of care signed by provider and mailed back to [facility] with [facility] at  [place] [address].

## 2019-10-15 NOTE — Progress Notes (Signed)
[facility] [place], [place] [address]  Internal MEDICINE  Office Visit Note  Patient Name: [name]  [id number]  [id number]  Date of Service: 10/31/2019   Pt is here for routine health maintenance examination   Chief Complaint  Patient presents with  . Medicare Wellness  . Diabetes  . Hypertension     The patient is here for health maintenance exam. Continues to have some abdominal pain with chronic constipation. His belly is mildly distended. He is doing well otherwise. Blood sugars are stale with current, low dose of januvia. Blood pressures are well managed with current medication. He is getting ready to go back to work, at least part time, soon. His family member is concerned he might be going back to work too soon after having a recent stroke. Patient states that he is doing well and is ready to go back to work. Denies unusual shortness of breath. Is seeing urology/oncology due to recurrence of prostate cancer. Is tolerating the treatments well.    Current Medication: Outpatient Encounter Medications as of 10/15/2019  Medication Sig  . albuterol (VENTOLIN HFA) 108 (90 Base) MCG/ACT inhaler Inhale 2 puffs into the lungs every 4 (four) hours as needed for wheezing or shortness of breath.  [place] Kitchen apixaban (ELIQUIS) 2.5 MG TABS tablet Take 2.5 mg by mouth 2 (two) times daily.  Marland Kitchen atorvastatin (LIPITOR) 10 MG tablet Take 1 tablet (10 mg total) by mouth daily.  . ferrous sulfate 325 (65 FE) MG tablet Take 325 mg by mouth daily.   Mar[place] Kitchen glucose blood (ONETOUCH VERIO) test strip USE ONCE DAILY. DX E11.65.  . metoprolol tartrate (LOPRESSOR) 25 MG tablet Take 1 tablet (25 mg total) by mouth 2 (two) times daily.  . pantoprazole (PROTONIX) 40 MG tablet Take 1 tablet (40 mg total) by mouth daily.  Marland Kitchen senna-docusate (SENOKOT-S) 8.6-50 MG tablet Take 1 tablet by mouth at bedtime as needed for mild constipation.  . sitaGLIPtin (JANUVIA) 25 MG tablet Take 1 tablet (25  mg total) by mouth daily.  . vitamin B-12 (CYANOCOBALAMIN) 1000 MCG tablet Take 1 tablet (1,000 mcg total) by mouth daily.   No facility-administered encounter medications on file as of 10/15/2019.    Surgical History: Past Surgical History:  Procedure Laterality Date  . CATARACT EXTRACTION    . COLONOSCOPY WITH PROPOFOL N/A 07/02/2019   Procedure: COLONOSCOPY WITH PROPOFOL;  Surgeon: Lin Landsman, MD;  Location: [facility] ENDOSCOPY;  Service: Gastroenterology;  Laterality: N/A;  . IR CT HEAD LTD  08/27/2019  . IR PERCUTANEOUS ART THROMBECTOMY/INFUSION INTRACRANIAL INC DIAG ANGIO  08/27/2019      . IR PERCUTANEOUS ART THROMBECTOMY/INFUSION INTRACRANIAL INC DIAG ANGIO  08/27/2019  . PROSTATE CANCER    . PROSTATE SURGERY    . RADIOLOGY WITH ANESTHESIA N/A 08/27/2019   Procedure: IR WITH ANESTHESIA;  Surgeon: Luanne Bras, MD;  Location: [facility];  Service: Radiology;  Laterality: N/A;    Medical History: Past Medical History:  Diagnosis Date  . Atrial fibrillation ([place])   . CHF (congestive heart failure) ([place])   . Diabetes ([place])   . Hearing loss   . High blood pressure   . History of bladder problems   . Kidney stones 11/22/2014  . Prostate cancer ([place])   . Stroke [facility])     Family History: Family History  Problem Relation Age of Onset  . Colon cancer Mother   . Diabetes Other   . High blood pressure Other   .  Prostate cancer Brother   . Diabetes Brother       Review of Systems  Constitutional: Positive for fatigue. Negative for activity change, chills and unexpected weight change.       Activity levels are improving since his most recent visit.   HENT: Negative for congestion, postnasal drip, rhinorrhea, sneezing and sore throat.   Respiratory: Negative for cough, chest tightness, shortness of breath and wheezing.   Cardiovascular: Negative for chest pain and palpitations.  Gastrointestinal: Positive for abdominal distention and constipation. Negative for abdominal pain,  diarrhea, nausea and vomiting.  Endocrine: Negative for cold intolerance, heat intolerance, polydipsia and polyuria.       Improved blood sugars .  Genitourinary: Negative for dysuria, hematuria and urgency.  Musculoskeletal: Negative for arthralgias, back pain, joint swelling and neck pain.  Skin: Negative for rash.  Allergic/Immunologic: Negative for environmental allergies.  Neurological: Positive for weakness. Negative for dizziness, tremors, numbness and headaches.  Hematological: Negative for adenopathy. Does not bruise/bleed easily.  Psychiatric/Behavioral: Negative for behavioral problems (Depression), sleep disturbance and suicidal ideas. The patient is not nervous/anxious.     Today's Vitals   10/15/19 1454  BP: 119/65  Pulse: (!) 53  Resp: 16  Temp: (!) 95.7 F (35.4 C)  SpO2: 98%  Weight: 241 lb 6.4 oz (109.5 kg)  Height: 6' (1.829 m)   Body mass index is 32.74 kg/m.  Physical Exam Vitals and nursing note reviewed.  Constitutional:      General: He is not in acute distress.    Appearance: Normal appearance. He is well-developed. He is not diaphoretic.  HENT:     Head: Normocephalic and atraumatic.     Nose: Nose normal.     Mouth/Throat:     Pharynx: No oropharyngeal exudate.  Eyes:     Pupils: Pupils are equal, round, and reactive to light.  Neck:     Thyroid: No thyromegaly.     Vascular: No carotid bruit or JVD.     Trachea: No tracheal deviation.  Cardiovascular:     Rate and Rhythm: Normal rate and regular rhythm.     Pulses:          Dorsalis pedis pulses are 1+ on the right side and 1+ on the left side.       Posterior tibial pulses are 1+ on the right side and 1+ on the left side.     Heart sounds: Murmur heard.  No friction rub. No gallop.   Pulmonary:     Effort: Pulmonary effort is normal. No respiratory distress.     Breath sounds: Normal breath sounds. No wheezing or rales.  Chest:     Chest wall: No tenderness.  Abdominal:     General:  Bowel sounds are normal. There is distension.     Palpations: Abdomen is soft.     Tenderness: There is no abdominal tenderness.  Genitourinary:    Comments: U/a showing trace of blood only.  Musculoskeletal:        General: Normal range of motion.     Cervical back: Normal range of motion and neck supple.     Right foot: Normal range of motion. No deformity, bunion, Charcot foot, foot drop or prominent metatarsal heads.     Left foot: Normal range of motion. No deformity, bunion, Charcot foot, foot drop or prominent metatarsal heads.  Feet:     Right foot:     Protective Sensation: 10 sites tested. 10 sites sensed.  Skin integrity: Skin integrity normal.     Toenail Condition: Right toenails are normal.     Left foot:     Protective Sensation: 10 sites tested. 10 sites sensed.     Skin integrity: Skin integrity normal.     Toenail Condition: Left toenails are normal.  Lymphadenopathy:     Cervical: No cervical adenopathy.  Skin:    General: Skin is warm and dry.  Neurological:     Mental Status: He is alert and oriented to person, place, and time. Mental status is at baseline.     Cranial Nerves: No cranial nerve deficit.  Psychiatric:        Mood and Affect: Mood normal.        Behavior: Behavior normal.        Thought Content: Thought content normal.        Judgment: Judgment normal.    Depression screen [facility] 2/9 10/29/2019 10/15/2019 09/08/2019 06/29/2019 02/05/2019  Decreased Interest 0 0 0 0 0  Down, Depressed, Hopeless 0 0 0 0 0  PHQ - 2 Score 0 0 0 0 0  Some recent data might be hidden    Functional Status Survey: Is the patient deaf or have difficulty hearing?: No Does the patient have difficulty seeing, even when wearing glasses/contacts?: No Does the patient have difficulty concentrating, remembering, or making decisions?: No Does the patient have difficulty walking or climbing stairs?: No Does the patient have difficulty dressing or bathing?: No Does the patient  have difficulty doing errands alone such as visiting a doctor's office or shopping?: No  MMSE - [place] Exam 10/15/2019 07/23/2018 07/01/2017  Orientation to time 5 5 5   Orientation to Place 5 5 5   Registration 3 3 3   Attention/ Calculation 5 5 5   Recall 3 3 3   Language- name 2 objects 2 2 2   Language- repeat 1 1 1   Language- follow 3 step command 3 3 3   Language- read & follow direction 1 1 1   Write a sentence 0 1 0  Copy design 0 1 0  Total score 28 30 28     Fall Risk  10/29/2019 10/29/2019 10/15/2019 09/08/2019 06/29/2019  Falls in the past year? 0 0 0 1 0  Number falls in past yr: 0 - - 0 -  Injury with Fall? 0 - - 1 -  Comment - - - - -  Risk for fall due to : No Fall Risks - - Impaired balance/gait;Impaired mobility -  Follow up Education provided;Falls prevention discussed - - Education provided;Falls prevention discussed -      LABS: Recent Results (from the past 2160 hour(s))  Glucose, capillary     Status: Abnormal   Collection Time: 08/27/19  6:49 PM  Result Value Ref Range   Glucose-Capillary 129 (H) 70 - 99 mg/dL    Comment: Glucose reference range applies only to samples taken after fasting for at least 8 hours.   Comment 1 Notify RN    Comment 2 Document in Chart   Ethanol     Status: None   Collection Time: 08/27/19  7:13 PM  Result Value Ref Range   Alcohol, Ethyl (B) <10 <10 mg/dL    Comment: (NOTE) Lowest detectable limit for serum alcohol is 10 mg/dL.  For medical purposes only. Performed at [facility], [address], [place], [place] [address]   Protime-INR     Status: None   Collection Time: 08/27/19  7:13 PM  Result Value  Ref Range   Prothrombin Time 13.0 11.4 - 15.2 seconds   INR 1.0 0.8 - 1.2    Comment: (NOTE) INR goal varies based on device and disease states. Performed at [facility], [address] [place], [place] [address]   APTT     Status: None   Collection Time: 08/27/19  7:13 PM  Result Value Ref  Range   aPTT 29 24 - 36 seconds    Comment: Performed at [facility], [address] [place], [place] [address]  CBC     Status: Abnormal   Collection Time: 08/27/19  7:13 PM  Result Value Ref Range   WBC 6.0 4.0 - 10.5 K/uL   RBC 3.91 (L) 4.22 - 5.81 MIL/uL   Hemoglobin 11.2 (L) 13.0 - 17.0 g/dL   HCT 37.1 (L) 39 - 52 %   MCV 94.9 80.0 - 100.0 fL   MCH 28.6 26.0 - 34.0 pg   MCHC 30.2 30.0 - 36.0 g/dL   RDW 15.5 11.5 - 15.5 %   Platelets 199 150 - 400 K/uL   nRBC 0.0 0.0 - 0.2 %    Comment: Performed at [facility], [address] [place], [place] [address]  Differential     Status: Abnormal   Collection Time: 08/27/19  7:13 PM  Result Value Ref Range   Neutrophils Relative % 45 %   Neutro Abs 2.8 1.7 - 7.7 K/uL   Lymphocytes Relative 31 %   Lymphs Abs 1.8 0.7 - 4.0 K/uL   Monocytes Relative 18 %   Monocytes Absolute 1.1 (H) 0.1 - 1.0 K/uL   Eosinophils Relative 5 %   Eosinophils Absolute 0.3 0 - 0 K/uL   Basophils Relative 1 %   Basophils Absolute 0.0 0 - 0 K/uL   Immature Granulocytes 0 %   Abs Immature Granulocytes 0.02 0.00 - 0.07 K/uL    Comment: Performed at [facility], [address] [place], [place] [address]  Comprehensive metabolic panel     Status: Abnormal   Collection Time: 08/27/19  7:13 PM  Result Value Ref Range   Sodium 140 135 - 145 mmol/L   Potassium 4.7 3.5 - 5.1 mmol/L    Comment: HEMOLYSIS AT THIS LEVEL MAY AFFECT RESULT   Chloride 107 98 - 111 mmol/L   CO2 25 22 - 32 mmol/L   Glucose, Bld 140 (H) 70 - 99 mg/dL    Comment: Glucose reference range applies only to samples taken after fasting for at least 8 hours.   BUN 27 (H) 8 - 23 mg/dL   Creatinine, Ser 1.89 (H) 0.61 - 1.24 mg/dL   Calcium 9.0 8.9 - 10.3 mg/dL   Total Protein 7.2 6.5 - 8.1 g/dL   Albumin 3.5 3.5 - 5.0 g/dL   AST 31 15 - 41 U/L   ALT 25 0 - 44 U/L   Alkaline Phosphatase 109 38 - 126 U/L   Total Bilirubin 0.8 0.3 - 1.2 mg/dL   GFR calc non  Af Amer 32 (L) >60 mL/min   GFR calc Af Amer 37 (L) >60 mL/min   Anion gap 8 5 - 15    Comment: Performed at [facility], [address] [place], [place] [address]  SARS Coronavirus 2 by RT PCR (hospital order, performed in [facility] hospital lab) Nasopharyngeal Nasopharyngeal Swab     Status: None   Collection Time: 08/27/19  7:13 PM   Specimen: Nasopharyngeal Swab  Result Value  Ref Range   SARS Coronavirus 2 NEGATIVE NEGATIVE    Comment: (NOTE) SARS-CoV-2 target nucleic acids are NOT DETECTED.  The SARS-CoV-2 RNA is generally detectable in upper and lower respiratory specimens during the acute phase of infection. The lowest concentration of SARS-CoV-2 viral copies this assay can detect is 250 copies / mL. A negative result does not preclude SARS-CoV-2 infection and should not be used as the sole basis for treatment or other patient management decisions.  A negative result may occur with improper specimen collection / handling, submission of specimen other than nasopharyngeal swab, presence of viral mutation(s) within the areas targeted by this assay, and inadequate number of viral copies (<250 copies / mL). A negative result must be combined with clinical observations, patient history, and epidemiological information.  Fact Sheet for Patients:   StrictlyIdeas.no  Fact Sheet for Healthcare Providers: BankingDealers.co.za  This test is not yet approved or  cleared by the [place] FDA and has been authorized for detection and/or diagnosis of SARS-CoV-2 by FDA under an Emergency Use Authorization (EUA).  This EUA will remain in effect (meaning this test can be used) for the duration of the COVID-19 declaration under Section 564(b)(1) of the Act, 21 U.S.C. section 360bbb-3(b)(1), unless the authorization is terminated or revoked sooner.  Performed at [facility], [address] [place], [place]  [address]   MRSA PCR Screening     Status: None   Collection Time: 08/27/19 10:55 PM   Specimen: Nasal Mucosa; Nasopharyngeal  Result Value Ref Range   MRSA by PCR NEGATIVE NEGATIVE    Comment:        The GeneXpert MRSA Assay (FDA approved for NASAL specimens only), is one component of a comprehensive MRSA colonization surveillance program. It is not intended to diagnose MRSA infection nor to guide or monitor treatment for MRSA infections. Performed at [facility], [place] [address] [place], [place] [address]   Glucose, capillary     Status: Abnormal   Collection Time: 08/28/19  3:49 AM  Result Value Ref Range   Glucose-Capillary 180 (H) 70 - 99 mg/dL    Comment: Glucose reference range applies only to samples taken after fasting for at least 8 hours.  Basic metabolic panel     Status: Abnormal   Collection Time: 08/28/19  6:28 AM  Result Value Ref Range   Sodium 137 135 - 145 mmol/L   Potassium 4.9 3.5 - 5.1 mmol/L   Chloride 107 98 - 111 mmol/L   CO2 20 (L) 22 - 32 mmol/L   Glucose, Bld 161 (H) 70 - 99 mg/dL    Comment: Glucose reference range applies only to samples taken after fasting for at least 8 hours.   BUN 18 8 - 23 mg/dL   Creatinine, Ser 1.58 (H) 0.61 - 1.24 mg/dL   Calcium 8.8 (L) 8.9 - 10.3 mg/dL   GFR calc non Af Amer 40 (L) >60 mL/min   GFR calc Af Amer 46 (L) >60 mL/min   Anion gap 10 5 - 15    Comment: Performed at [facility] [address], [place], [place] [address]  Lipid panel     Status: None   Collection Time: 08/28/19  6:28 AM  Result Value Ref Range   Cholesterol 78 0 - 200 mg/dL   Triglycerides 44 <150 mg/dL   HDL 42 >40 mg/dL   Total CHOL/HDL Ratio 1.9 RATIO   VLDL 9 0 - 40 mg/dL   LDL  Cholesterol 27 0 - 99 mg/dL    Comment:        Total Cholesterol/HDL:CHD Risk Coronary Heart Disease Risk Table                     Men   Women  1/2 Average Risk   3.4   3.3  Average Risk       5.0   4.4  2 X Average Risk   9.6   7.1  3 X  Average Risk  23.4   11.0        Use the calculated Patient Ratio above and the CHD Risk Table to determine the patient's CHD Risk.        ATP III CLASSIFICATION (LDL):  <100     mg/dL   Optimal  100-129  mg/dL   Near or Above                    Optimal  130-159  mg/dL   Borderline  160-189  mg/dL   High  >190     mg/dL   Very High Performed at [facility] [address] [place], [place] [address]   Glucose, capillary     Status: Abnormal   Collection Time: 08/28/19  7:43 AM  Result Value Ref Range   Glucose-Capillary 161 (H) 70 - 99 mg/dL    Comment: Glucose reference range applies only to samples taken after fasting for at least 8 hours.  Glucose, capillary     Status: Abnormal   Collection Time: 08/28/19 11:17 AM  Result Value Ref Range   Glucose-Capillary 151 (H) 70 - 99 mg/dL    Comment: Glucose reference range applies only to samples taken after fasting for at least 8 hours.  CBC     Status: Abnormal   Collection Time: 08/28/19 12:58 PM  Result Value Ref Range   WBC 6.4 4.0 - 10.5 K/uL   RBC 3.91 (L) 4.22 - 5.81 MIL/uL   Hemoglobin 11.2 (L) 13.0 - 17.0 g/dL   HCT 37.5 (L) 39 - 52 %   MCV 95.9 80.0 - 100.0 fL   MCH 28.6 26.0 - 34.0 pg   MCHC 29.9 (L) 30.0 - 36.0 g/dL   RDW 15.5 11.5 - 15.5 %   Platelets 192 150 - 400 K/uL   nRBC 0.0 0.0 - 0.2 %    Comment: Performed at [facility] [address] [place],  [address]  Hemoglobin A1c     Status: Abnormal   Collection Time: 08/28/19 12:58 PM  Result Value Ref Range   Hgb A1c MFr Bld 6.4 (H) 4.8 - 5.6 %    Comment: (NOTE) Pre diabetes:          5.7%-6.4%  Diabetes:              >6.4%  Glycemic control for   <7.0% adults with diabetes    Mean Plasma Glucose 136.98 mg/dL    Comment: Performed at [facility] [address], [place], [place] [address]  Glucose, capillary     Status: Abnormal   Collection Time: 08/28/19  4:46 PM  Result Value Ref Range   Glucose-Capillary 118 (H) 70  - 99 mg/dL    Comment: Glucose reference range applies only to samples taken after fasting for at least 8 hours.  ECHOCARDIOGRAM COMPLETE     Status: None   Collection Time: 08/28/19  5:12 PM  Result Value Ref  Range   Weight 3,523.83 oz   Height 72 in   BP 135/89 mmHg   S' Lateral 3.50 cm   AR max vel 3.66 cm2   AV Area VTI 3.58 cm2   AV Mean grad 1.0 mmHg   AV Peak grad 2.5 mmHg   Ao pk vel 0.80 m/s   AV Area mean vel 3.58 cm2  Glucose, capillary     Status: Abnormal   Collection Time: 08/28/19 10:13 PM  Result Value Ref Range   Glucose-Capillary 119 (H) 70 - 99 mg/dL    Comment: Glucose reference range applies only to samples taken after fasting for at least 8 hours.  CBC     Status: Abnormal   Collection Time: 08/29/19  3:23 AM  Result Value Ref Range   WBC 5.0 4.0 - 10.5 K/uL   RBC 3.70 (L) 4.22 - 5.81 MIL/uL   Hemoglobin 10.5 (L) 13.0 - 17.0 g/dL   HCT 35.6 (L) 39 - 52 %   MCV 96.2 80.0 - 100.0 fL   MCH 28.4 26.0 - 34.0 pg   MCHC 29.5 (L) 30.0 - 36.0 g/dL   RDW 15.5 11.5 - 15.5 %   Platelets 190 150 - 400 K/uL   nRBC 0.0 0.0 - 0.2 %    Comment: Performed at [facility], [place] [address], [place], [place] [address]  Basic metabolic panel     Status: Abnormal   Collection Time: 08/29/19  3:23 AM  Result Value Ref Range   Sodium 140 135 - 145 mmol/L   Potassium 4.4 3.5 - 5.1 mmol/L   Chloride 106 98 - 111 mmol/L   CO2 26 22 - 32 mmol/L   Glucose, Bld 120 (H) 70 - 99 mg/dL    Comment: Glucose reference range applies only to samples taken after fasting for at least 8 hours.   BUN 18 8 - 23 mg/dL   Creatinine, Ser 1.71 (H) 0.61 - 1.24 mg/dL   Calcium 8.8 (L) 8.9 - 10.3 mg/dL   GFR calc non Af Amer 36 (L) >60 mL/min   GFR calc Af Amer 42 (L) >60 mL/min   Anion gap 8 5 - 15    Comment: Performed at [facility] [address], [place], [place] [address]  Glucose, capillary     Status: Abnormal   Collection Time: 08/29/19  7:09 AM  Result Value Ref Range    Glucose-Capillary 113 (H) 70 - 99 mg/dL    Comment: Glucose reference range applies only to samples taken after fasting for at least 8 hours.  Glucose, capillary     Status: None   Collection Time: 08/29/19 11:17 AM  Result Value Ref Range   Glucose-Capillary 90 70 - 99 mg/dL    Comment: Glucose reference range applies only to samples taken after fasting for at least 8 hours.  Glucose, capillary     Status: Abnormal   Collection Time: 08/29/19  6:06 PM  Result Value Ref Range   Glucose-Capillary 136 (H) 70 - 99 mg/dL    Comment: Glucose reference range applies only to samples taken after fasting for at least 8 hours.  Glucose, capillary     Status: Abnormal   Collection Time: 08/29/19  9:18 PM  Result Value Ref Range   Glucose-Capillary 110 (H) 70 - 99 mg/dL    Comment: Glucose reference range applies only to samples taken after fasting for at least 8 hours.   Comment 1 Notify RN  Comment 2 Document in Chart   CBC     Status: Abnormal   Collection Time: 08/30/19  3:28 AM  Result Value Ref Range   WBC 5.7 4.0 - 10.5 K/uL   RBC 4.09 (L) 4.22 - 5.81 MIL/uL   Hemoglobin 11.8 (L) 13.0 - 17.0 g/dL   HCT 39.2 39 - 52 %   MCV 95.8 80.0 - 100.0 fL   MCH 28.9 26.0 - 34.0 pg   MCHC 30.1 30.0 - 36.0 g/dL   RDW 15.4 11.5 - 15.5 %   Platelets 200 150 - 400 K/uL   nRBC 0.0 0.0 - 0.2 %    Comment: Performed at [facility], [place] [address], [place], [place] [address]  Basic metabolic panel     Status: Abnormal   Collection Time: 08/30/19  3:28 AM  Result Value Ref Range   Sodium 140 135 - 145 mmol/L   Potassium 4.4 3.5 - 5.1 mmol/L   Chloride 105 98 - 111 mmol/L   CO2 25 22 - 32 mmol/L   Glucose, Bld 134 (H) 70 - 99 mg/dL    Comment: Glucose reference range applies only to samples taken after fasting for at least 8 hours.   BUN 21 8 - 23 mg/dL   Creatinine, Ser 1.63 (H) 0.61 - 1.24 mg/dL   Calcium 9.2 8.9 - 10.3 mg/dL   GFR calc non Af Amer 38 (L) >60 mL/min   GFR calc Af Amer 44  (L) >60 mL/min   Anion gap 10 5 - 15    Comment: Performed at [facility] [address] [place], [place] [address]  Glucose, capillary     Status: Abnormal   Collection Time: 08/30/19  6:08 AM  Result Value Ref Range   Glucose-Capillary 105 (H) 70 - 99 mg/dL    Comment: Glucose reference range applies only to samples taken after fasting for at least 8 hours.   Comment 1 Notify RN    Comment 2 Document in Chart   Glucose, capillary     Status: Abnormal   Collection Time: 08/30/19 11:17 AM  Result Value Ref Range   Glucose-Capillary 128 (H) 70 - 99 mg/dL    Comment: Glucose reference range applies only to samples taken after fasting for at least 8 hours.   Comment 1 Notify RN    Comment 2 Document in Chart   Glucose, capillary     Status: Abnormal   Collection Time: 08/30/19  3:54 PM  Result Value Ref Range   Glucose-Capillary 116 (H) 70 - 99 mg/dL    Comment: Glucose reference range applies only to samples taken after fasting for at least 8 hours.  Glucose, capillary     Status: Abnormal   Collection Time: 08/30/19 10:08 PM  Result Value Ref Range   Glucose-Capillary 113 (H) 70 - 99 mg/dL    Comment: Glucose reference range applies only to samples taken after fasting for at least 8 hours.   Comment 1 Notify RN    Comment 2 Document in Chart   CBC     Status: Abnormal   Collection Time: 08/31/19  3:01 AM  Result Value Ref Range   WBC 4.6 4.0 - 10.5 K/uL   RBC 4.01 (L) 4.22 - 5.81 MIL/uL   Hemoglobin 11.5 (L) 13.0 - 17.0 g/dL   HCT 37.5 (L) 39 - 52 %   MCV 93.5 80.0 - 100.0 fL   MCH 28.7 26.0 - 34.0 pg  MCHC 30.7 30.0 - 36.0 g/dL   RDW 15.2 11.5 - 15.5 %   Platelets 192 150 - 400 K/uL   nRBC 0.0 0.0 - 0.2 %    Comment: Performed at [facility], [place] [address] [place], [place] [address]  Basic metabolic panel     Status: Abnormal   Collection Time: 08/31/19  3:01 AM  Result Value Ref Range   Sodium 138 135 - 145 mmol/L   Potassium 4.1 3.5 - 5.1 mmol/L    Chloride 103 98 - 111 mmol/L   CO2 25 22 - 32 mmol/L   Glucose, Bld 137 (H) 70 - 99 mg/dL    Comment: Glucose reference range applies only to samples taken after fasting for at least 8 hours.   BUN 24 (H) 8 - 23 mg/dL   Creatinine, Ser 1.84 (H) 0.61 - 1.24 mg/dL   Calcium 9.1 8.9 - 10.3 mg/dL   GFR calc non Af Amer 33 (L) >60 mL/min   GFR calc Af Amer 38 (L) >60 mL/min   Anion gap 10 5 - 15    Comment: Performed at [facility] [address], [place], [place] [address]  Glucose, capillary     Status: Abnormal   Collection Time: 08/31/19  6:12 AM  Result Value Ref Range   Glucose-Capillary 135 (H) 70 - 99 mg/dL    Comment: Glucose reference range applies only to samples taken after fasting for at least 8 hours.   Comment 1 Notify RN    Comment 2 Document in Chart   Glucose, capillary     Status: Abnormal   Collection Time: 08/31/19  7:36 AM  Result Value Ref Range   Glucose-Capillary 113 (H) 70 - 99 mg/dL    Comment: Glucose reference range applies only to samples taken after fasting for at least 8 hours.  Glucose, capillary     Status: None   Collection Time: 08/31/19 12:05 PM  Result Value Ref Range   Glucose-Capillary 82 70 - 99 mg/dL    Comment: Glucose reference range applies only to samples taken after fasting for at least 8 hours.  CBC with Differential/Platelet     Status: Abnormal   Collection Time: 09/15/19  9:17 AM  Result Value Ref Range   WBC 6.7 4.0 - 10.5 K/uL   RBC 4.56 4.22 - 5.81 MIL/uL   Hemoglobin 13.1 13.0 - 17.0 g/dL   HCT 41.8 39 - 52 %   MCV 91.7 80.0 - 100.0 fL   MCH 28.7 26.0 - 34.0 pg   MCHC 31.3 30.0 - 36.0 g/dL   RDW 14.9 11.5 - 15.5 %   Platelets 229 150 - 400 K/uL   nRBC 0.0 0.0 - 0.2 %   Neutrophils Relative % 61 %   Neutro Abs 4.1 1.7 - 7.7 K/uL   Lymphocytes Relative 19 %   Lymphs Abs 1.3 0.7 - 4.0 K/uL   Monocytes Relative 10 %   Monocytes Absolute 0.7 0.1 - 1.0 K/uL   Eosinophils Relative 9 %   Eosinophils Absolute 0.6 (H) 0 -  0 K/uL   Basophils Relative 1 %   Basophils Absolute 0.0 0 - 0 K/uL   Immature Granulocytes 0 %   Abs Immature Granulocytes 0.03 0.00 - 0.07 K/uL    Comment: Performed at [facility], [address], [place], [place] [address]  Folate     Status: None   Collection Time: 09/15/19  9:17 AM  Result Value  Ref Range   Folate 13.6 >5.9 ng/mL    Comment: Performed at [facility], [address] [place], [place] [address]  Vitamin B12     Status: None   Collection Time: 09/15/19  9:17 AM  Result Value Ref Range   Vitamin B-12 231 180 - 914 pg/mL    Comment: (NOTE) This assay is not validated for testing neonatal or myeloproliferative syndrome specimens for Vitamin B12 levels. Performed at [facility], [place] [address] [place], [place] [address]   Testosterone     Status: Abnormal   Collection Time: 09/15/19  9:17 AM  Result Value Ref Range   Testosterone 26 (L) 264 - 916 ng/dL    Comment: (NOTE) Adult male reference interval is based on a population of healthy nonobese males (BMI <30) between 54 and 47 years old. [place], [place] [phone]. PMID: [id number]. Performed At: [facility] [place], [place] [id number] Rush Farmer MD PY:[id number]   PSA     Status: Abnormal   Collection Time: 09/15/19  9:17 AM  Result Value Ref Range   Prostatic Specific Antigen 19.66 (H) 0.00 - 4.00 ng/mL    Comment: (NOTE) While PSA levels of <=4.0 ng/ml are reported as reference range, some men with levels below 4.0 ng/ml can have prostate cancer and many men with PSA above 4.0 ng/ml do not have prostate cancer.  Other tests such as free PSA, age specific reference ranges, PSA velocity and PSA doubling time may be helpful especially in men less than 1 years old. Performed at [facility], [place] [address], [place], [place] [address]   PSA     Status: Abnormal   Collection Time: 10/12/19 10:35 AM  Result Value Ref Range    Prostatic Specific Antigen 17.68 (H) 0.00 - 4.00 ng/mL    Comment: (NOTE) While PSA levels of <=4.0 ng/ml are reported as reference range, some men with levels below 4.0 ng/ml can have prostate cancer and many men with PSA above 4.0 ng/ml do not have prostate cancer.  Other tests such as free PSA, age specific reference ranges, PSA velocity and PSA doubling time may be helpful especially in men less than 74 years old. Performed at [facility], [place] [address] [place] [address] [place], [place] [address]   Comprehensive metabolic panel     Status: Abnormal   Collection Time: 10/12/19 10:35 AM  Result Value Ref Range   Sodium 140 135 - 145 mmol/L   Potassium 4.8 3.5 - 5.1 mmol/L   Chloride 105 98 - 111 mmol/L   CO2 27 22 - 32 mmol/L   Glucose, Bld 276 (H) 70 - 99 mg/dL    Comment: Glucose reference range applies only to samples taken after fasting for at least 8 hours.   BUN 42 (H) 8 - 23 mg/dL   Creatinine, Ser 2.39 (H) 0.61 - 1.24 mg/dL   Calcium 9.1 8.9 - 10.3 mg/dL   Total Protein 7.8 6.5 - 8.1 g/dL   Albumin 3.5 3.5 - 5.0 g/dL   AST 26 15 - 41 U/L   ALT 32 0 - 44 U/L   Alkaline Phosphatase 124 38 - 126 U/L   Total Bilirubin 0.4 0.3 - 1.2 mg/dL   GFR calc non Af Amer 24 (L) >60 mL/min   GFR calc Af Amer 28 (L) >60 mL/min   Anion gap 8 5 - 15    Comment: Performed at [facility], [place],  [place] [address]  CBC with Differential/Platelet     Status: None   Collection Time: 10/12/19 10:35 AM  Result Value Ref Range   WBC 6.1 4.0 - 10.5 K/uL   RBC 5.03 4.22 - 5.81 MIL/uL   Hemoglobin 14.1 13.0 - 17.0 g/dL   HCT 44.6 39 - 52 %   MCV 88.7 80.0 - 100.0 fL   MCH 28.0 26.0 - 34.0 pg   MCHC 31.6 30.0 - 36.0 g/dL   RDW 14.7 11.5 - 15.5 %   Platelets 207 150 - 400 K/uL   nRBC 0.0 0.0 - 0.2 %   Neutrophils Relative % 63 %   Neutro Abs 3.8 1.7 - 7.7 K/uL   Lymphocytes Relative 23 %   Lymphs Abs 1.4 0.7 - 4.0 K/uL   Monocytes Relative 11 %   Monocytes Absolute 0.7  0.1 - 1.0 K/uL   Eosinophils Relative 3 %   Eosinophils Absolute 0.2 0 - 0 K/uL   Basophils Relative 0 %   Basophils Absolute 0.0 0 - 0 K/uL   Immature Granulocytes 0 %   Abs Immature Granulocytes 0.02 0.00 - 0.07 K/uL    Comment: Performed at [facility], [address] [place],  [address]  UA/M w/rflx Culture, Routine     Status: None   Collection Time: 10/15/19  3:44 PM   Specimen: Urine   Urine  Result Value Ref Range   Specific Gravity, UA 1.021 1.005 - 1.030   pH, UA 6.0 5.0 - 7.5   Color, UA Yellow Yellow   Appearance Ur Clear Clear   Leukocytes,UA Negative Negative   Protein,UA Negative Negative/Trace   Glucose, UA Negative Negative   Ketones, UA Negative Negative   RBC, UA Negative Negative   Bilirubin, UA Negative Negative   Urobilinogen, Ur 0.2 0.2 - 1.0 mg/dL   Nitrite, UA Negative Negative   Microscopic Examination Comment     Comment: Microscopic follows if indicated.   Microscopic Examination See below:     Comment: Microscopic was indicated and was performed.   Urinalysis Reflex Comment     Comment: This specimen will not reflex to a Urine Culture.  Microscopic Examination     Status: None   Collection Time: 10/15/19  3:44 PM   Urine  Result Value Ref Range   WBC, UA 0-5 0 - 5 /hpf   RBC None seen 0 - 2 /hpf   Epithelial Cells (non renal) 0-10 0 - 10 /hpf   Casts None seen None seen /lpf   Bacteria, UA None seen None seen/Few   Assessment/Plan: 1. Encounter for general adult medical examination with abnormal findings Annual health maintenance exam today.  2. Abdominal distension uncelar cause. Will get ultrasound of the abdomen for further evaluation.  - US Abdomen Complete; Future  3. Atherosclerosis of aorta ([place]) Will get ultrasound of abdominal vasculature for further evaluation.  - VAS US AORTA/IVC/ILIACS; Future  4. Essential hypertension Stable. continue bp medication as prescribd   5. Type 2 diabetes mellitus with stage  3b chronic kidney disease, without long-term current use of insulin (HCC) Stable. Continue diabetic medication as prescribed. Regular visits with nephrology as scheduled.   6. Dysuria - UA/M w/rflx Culture, Routine  General Counseling: Sakib verbalizes understanding of the findings of todays visit and agrees with plan of treatment. I have discussed any further diagnostic evaluation that may be needed or ordered today. We also reviewed his medications today. he has been encouraged to call the office  with any questions or concerns that should arise related to todays visit.    Counseling:  This patient was seen by Leretha Pol FNP Collaboration with Dr Lavera Guise as a part of collaborative care agreement  Orders Placed This Encounter  Procedures  . Microscopic Examination  . US Abdomen Complete  . UA/M w/rflx Culture, Routine  . VAS US AORTA/IVC/ILIACS    Total time spent: 60 Minutes  Time spent includes review of chart, medications, test results, and follow up plan with the patient.     Lavera Guise, MD  Internal Medicine

## 2019-10-16 LAB — UA/M W/RFLX CULTURE, ROUTINE
Bilirubin, UA: NEGATIVE
Glucose, UA: NEGATIVE
Ketones, UA: NEGATIVE
Leukocytes,UA: NEGATIVE
Nitrite, UA: NEGATIVE
Protein,UA: NEGATIVE
RBC, UA: NEGATIVE
Specific Gravity, UA: 1.021 (ref 1.005–1.030)
Urobilinogen, Ur: 0.2 mg/dL (ref 0.2–1.0)
pH, UA: 6 (ref 5.0–7.5)

## 2019-10-16 LAB — MICROSCOPIC EXAMINATION
Bacteria, UA: NONE SEEN
Casts: NONE SEEN /lpf
RBC, Urine: NONE SEEN /hpf (ref 0–2)

## 2019-10-18 ENCOUNTER — Other Ambulatory Visit: Payer: Self-pay | Admitting: *Deleted

## 2019-10-18 NOTE — Addendum Note (Signed)
Addended byValente David on: 10/18/2019 04:55 PM   Modules accepted: Orders

## 2019-10-18 NOTE — Patient Outreach (Addendum)
[facility]) Care Management  10/18/2019  [name] 13-Apr-1933 [id number]   Call placed to member/wife to follow up on ongoing recovery from stroke.  Wife report member is progressing well, able to walk and talk without problems.  She is now more concerned that his cancer has come back, receiving injections for treatment.  Her daughter in law and son has continued to help manager his care, but he still has not had follow up with neurology to be released to go back to work.  Conference call placed to [facility], message left requesting follow up appointment.  Wife uncertain if they will need transportation as son and his wife both work outside of the [place] area.  Advised to contact this care manager once call is received back to schedule, will request transportation assistance from [place] at that time.  Denies any urgent concerns, will follow up within the next week to confirm appointment has been scheduled.   Update:    Incoming call received from wife, state neurology appointment has been scheduled for 10/13.  Will request transportation from [place], this care manager will follow up with member/wife within the next month.  Wife encouraged to contact this care manager with questions/concerns.  Goals Addressed              This Visit's Progress   .  Recover from stroke, get stronger and go back to work (pt-stated)   On track     [place] (see longitudinal plan of care for additional care plan information)  Current Barriers:  Marland Kitchen Knowledge Deficits related to Stroke  Nurse Case Manager Clinical Goal(s):  Marland Kitchen Over the next 31 days, patient will not experience hospital admission. Hospital Admissions in last 6 months = 1 . Over the next 28 days, patient will attend all scheduled medical appointments: Neurology, PCP, Cardiology, interventional radiology . Over the next 28 days, patient will work with CM clinical social worker to secure transportation and  financial concerns  Interventions:  . Inter-disciplinary care team collaboration (see longitudinal plan of care) . Provided education to patient re: stroke recovery . Discussed plans with patient for ongoing care management follow up and provided patient with direct contact information for care management team . Reviewed scheduled/upcoming provider appointments including:  . Social Work referral for community resources  Patient Self Care Activities:  . Attends all scheduled provider appointments . Performs ADL's independently . Calls provider office for new concerns or questions   UPDATE:  Requested appointment for neurology.       [name], [place], MSN [place] [phone]

## 2019-10-20 ENCOUNTER — Other Ambulatory Visit: Payer: Self-pay | Admitting: *Deleted

## 2019-10-20 ENCOUNTER — Encounter: Payer: Self-pay | Admitting: *Deleted

## 2019-10-20 NOTE — Patient Outreach (Signed)
[name]) Care Management  10/20/2019  [name] 05/12/1933 [id number]   CSW made an initial attempt to try and contact patient today to perform the phone assessment, as well as assess and assist with social work needs and services, without success.  A HIPAA compliant message was left for patient on voicemail.  CSW is currently awaiting a return call.  CSW will make a second outreach attempt within the next 3-4 business days, if a return call is not received from patient in the meantime.  CSW will also mail a Patient Unsuccessful Outreach Letter to patient's home, requesting that patient contact CSW directly if he is interested in receiving social work services through [place] with [facility].  [name], BSW, MSW, LCSW  Licensed Education officer, environmental [facility] System  Mailing [place] [address] [place], [place] [address] Physical Address-[address] [place], [place] [address] Toll Free Main # [phone] Fax # [phone] Cell # [phone]  [email or web address]

## 2019-10-22 DIAGNOSIS — I10 Essential (primary) hypertension: Secondary | ICD-10-CM | POA: Diagnosis not present

## 2019-10-22 DIAGNOSIS — I482 Chronic atrial fibrillation, unspecified: Secondary | ICD-10-CM | POA: Diagnosis not present

## 2019-10-22 DIAGNOSIS — I5022 Chronic systolic (congestive) heart failure: Secondary | ICD-10-CM | POA: Diagnosis not present

## 2019-10-22 DIAGNOSIS — E782 Mixed hyperlipidemia: Secondary | ICD-10-CM | POA: Diagnosis not present

## 2019-10-25 ENCOUNTER — Other Ambulatory Visit: Payer: Self-pay | Admitting: *Deleted

## 2019-10-25 ENCOUNTER — Ambulatory Visit: Payer: Medicare Other | Admitting: Nurse Practitioner

## 2019-10-25 NOTE — Patient Outreach (Signed)
[facility]) Care Management  10/25/2019  HUMBERT MOROZOV 1934-01-19 [id number]   This CSW, covering for assigned CSW, received a consult to assist with transportation to MD appointment next week.  CSW called pt's home and per wife, pt is at work- had a friend pick him up.  Per wife, they may have family available to assist with pt's ride on 11/03/2019.  CSW provided wife with contact # for reaching [place] to advise once they know for sure if ride is needed.   CSW will await call back and/or advise assigned CSW of above for follow up and finalizing confirmation and arrangements if needed.   Eduard Clos, MSW, [place] Worker  [place] [phone]

## 2019-10-26 ENCOUNTER — Telehealth: Payer: Self-pay

## 2019-10-26 NOTE — Telephone Encounter (Signed)
Called pt ro RS appt missed on 10/25/19, no answer and unable to lvm

## 2019-10-27 ENCOUNTER — Ambulatory Visit: Payer: Self-pay | Admitting: *Deleted

## 2019-10-29 ENCOUNTER — Other Ambulatory Visit: Payer: Self-pay | Admitting: *Deleted

## 2019-10-29 ENCOUNTER — Encounter: Payer: Self-pay | Admitting: *Deleted

## 2019-10-29 NOTE — Patient Outreach (Signed)
[facility]) Care Management  10/29/2019  [name] April 10, 1933 [id number]   CSW was able to make initial contact with patient's wife, [name] today, to perform the phone assessment, as well as assess and assist with social work needs and services.  CSW introduced self, explained role and types of services provided through [place] [facility] ([facility]).  [name] remembered having worked with [employer] in the past.  CSW further explained to [name] that [place] works with patient's RNCM, also with [place] [facility], [name].  CSW then explained the reason for the call, indicating that [name] thought that patient would benefit from social work services and resources to assist with arranging transportation to and from physician appointments.  CSW obtained two HIPAA compliant identifiers from [name], which included patient's name and date of birth.  [name] admitted that patient will require transportation assistance to his upcoming appointment with Dr. Frann Rider, Nurse Practitioner with [facility], scheduled for Wednesday, November 03, 2019, at 1:45pm.  Once again, CSW reminded [name] that patient is entitled to 24 one-way rides, or 12 roundtrip rides, through his [employer] benefit.  CSW provided [name] with the contact information for [employer] [phone]), encouraging her to go ahead and contact them today to arrange patient's transportation for Wednesday's appointment.  [name] voiced understanding and was agreeable to this plan, indicating that she plans to call [employer] services for all upcoming physician appointments, as soon as the call is terminated.  CSW will perform a case closure on patient, as all goals of treatment have been met from social work  standpoint and no additional social work needs have been identified at this time.  CSW will notify patient's RNCM with [facility], [name] of CSW's plans to close patient's case.  CSW will fax an update to patient's Primary Care Physician, Dr. Leretha Pol to ensure that she is aware of CSW's involvement with patient's plan of care, as well as route a Physician Case Closure Letter.  CSW was able to confirm that [name] has the correct contact information for CSW, encouraging her to contact CSW directly if additional social work needs arise in the near future, or if patient or Mrs. Brozek requires assistance with arranging transportation services.   Nat Christen, BSW, MSW, LCSW  Licensed Education officer, environmental Health System  Mailing [place] [address] [place], [place] [address] Physical Address-[address] [place], [place] [address] Toll Free Main # [phone] Fax # [phone] Cell # [phone]  [email or web address]

## 2019-10-31 DIAGNOSIS — R140 Abdominal distension (gaseous): Secondary | ICD-10-CM | POA: Insufficient documentation

## 2019-11-03 ENCOUNTER — Inpatient Hospital Stay: Payer: Medicare Other | Admitting: Adult Health

## 2019-11-05 ENCOUNTER — Ambulatory Visit: Payer: Medicare Other

## 2019-11-05 ENCOUNTER — Ambulatory Visit (INDEPENDENT_AMBULATORY_CARE_PROVIDER_SITE_OTHER): Payer: Medicare Other | Admitting: Internal Medicine

## 2019-11-05 DIAGNOSIS — H6123 Impacted cerumen, bilateral: Secondary | ICD-10-CM | POA: Diagnosis not present

## 2019-11-05 DIAGNOSIS — I700 Atherosclerosis of aorta: Secondary | ICD-10-CM | POA: Diagnosis not present

## 2019-11-05 NOTE — Progress Notes (Signed)
Pt came in for OV to have ears checked.  Lovena Le examined pt and he needed both ears cleaned.  I did bi-lateral ear lavage on pt and cleared both ears of impacted cerumen.

## 2019-11-09 NOTE — Progress Notes (Signed)
6 months follow up AAA

## 2019-11-09 NOTE — Progress Notes (Signed)
Discuss at visit 11/30/2019 - follow up 6 months

## 2019-11-10 ENCOUNTER — Inpatient Hospital Stay: Payer: Medicare Other | Attending: Oncology

## 2019-11-10 ENCOUNTER — Other Ambulatory Visit: Payer: Self-pay

## 2019-11-10 ENCOUNTER — Other Ambulatory Visit: Payer: Medicare Other

## 2019-11-10 DIAGNOSIS — C772 Secondary and unspecified malignant neoplasm of intra-abdominal lymph nodes: Secondary | ICD-10-CM | POA: Insufficient documentation

## 2019-11-10 DIAGNOSIS — Z79899 Other long term (current) drug therapy: Secondary | ICD-10-CM | POA: Diagnosis not present

## 2019-11-10 DIAGNOSIS — C61 Malignant neoplasm of prostate: Secondary | ICD-10-CM | POA: Insufficient documentation

## 2019-11-10 DIAGNOSIS — I4891 Unspecified atrial fibrillation: Secondary | ICD-10-CM | POA: Diagnosis not present

## 2019-11-10 DIAGNOSIS — Z7901 Long term (current) use of anticoagulants: Secondary | ICD-10-CM | POA: Diagnosis not present

## 2019-11-10 LAB — COMPREHENSIVE METABOLIC PANEL
ALT: 23 U/L (ref 0–44)
AST: 21 U/L (ref 15–41)
Albumin: 3.6 g/dL (ref 3.5–5.0)
Alkaline Phosphatase: 95 U/L (ref 38–126)
Anion gap: 9 (ref 5–15)
BUN: 32 mg/dL — ABNORMAL HIGH (ref 8–23)
CO2: 27 mmol/L (ref 22–32)
Calcium: 9.3 mg/dL (ref 8.9–10.3)
Chloride: 106 mmol/L (ref 98–111)
Creatinine, Ser: 2.05 mg/dL — ABNORMAL HIGH (ref 0.61–1.24)
GFR, Estimated: 29 mL/min — ABNORMAL LOW (ref >60)
Glucose, Bld: 132 mg/dL — ABNORMAL HIGH (ref 70–99)
Potassium: 4.9 mmol/L (ref 3.5–5.1)
Sodium: 142 mmol/L (ref 135–145)
Total Bilirubin: 0.5 mg/dL (ref 0.3–1.2)
Total Protein: 7.6 g/dL (ref 6.5–8.1)

## 2019-11-10 LAB — CBC WITH DIFFERENTIAL/PLATELET
Abs Immature Granulocytes: 0.01 10*3/uL (ref 0.00–0.07)
Basophils Absolute: 0 10*3/uL (ref 0.0–0.1)
Basophils Relative: 0 %
Eosinophils Absolute: 0.2 10*3/uL (ref 0.0–0.5)
Eosinophils Relative: 3 %
HCT: 42.7 % (ref 39.0–52.0)
Hemoglobin: 13.5 g/dL (ref 13.0–17.0)
Immature Granulocytes: 0 %
Lymphocytes Relative: 39 %
Lymphs Abs: 2 10*3/uL (ref 0.7–4.0)
MCH: 28 pg (ref 26.0–34.0)
MCHC: 31.6 g/dL (ref 30.0–36.0)
MCV: 88.4 fL (ref 80.0–100.0)
Monocytes Absolute: 0.9 10*3/uL (ref 0.1–1.0)
Monocytes Relative: 17 %
Neutro Abs: 2.1 10*3/uL (ref 1.7–7.7)
Neutrophils Relative %: 41 %
Platelets: 186 10*3/uL (ref 150–400)
RBC: 4.83 MIL/uL (ref 4.22–5.81)
RDW: 15.7 % — ABNORMAL HIGH (ref 11.5–15.5)
WBC: 5.2 10*3/uL (ref 4.0–10.5)
nRBC: 0 % (ref 0.0–0.2)

## 2019-11-10 LAB — PSA: Prostatic Specific Antigen: 18.39 ng/mL — ABNORMAL HIGH (ref 0.00–4.00)

## 2019-11-12 ENCOUNTER — Other Ambulatory Visit: Payer: Self-pay

## 2019-11-12 ENCOUNTER — Inpatient Hospital Stay: Payer: Medicare Other

## 2019-11-12 ENCOUNTER — Encounter: Payer: Self-pay | Admitting: Oncology

## 2019-11-12 ENCOUNTER — Inpatient Hospital Stay (HOSPITAL_BASED_OUTPATIENT_CLINIC_OR_DEPARTMENT_OTHER): Payer: Medicare Other | Admitting: Oncology

## 2019-11-12 VITALS — BP 111/78 | HR 108 | Temp 97.5°F | Wt 246.8 lb

## 2019-11-12 DIAGNOSIS — R590 Localized enlarged lymph nodes: Secondary | ICD-10-CM

## 2019-11-12 DIAGNOSIS — Z7901 Long term (current) use of anticoagulants: Secondary | ICD-10-CM | POA: Diagnosis not present

## 2019-11-12 DIAGNOSIS — C61 Malignant neoplasm of prostate: Secondary | ICD-10-CM | POA: Diagnosis not present

## 2019-11-12 DIAGNOSIS — Z79818 Long term (current) use of other agents affecting estrogen receptors and estrogen levels: Secondary | ICD-10-CM

## 2019-11-12 DIAGNOSIS — C772 Secondary and unspecified malignant neoplasm of intra-abdominal lymph nodes: Secondary | ICD-10-CM | POA: Diagnosis not present

## 2019-11-12 DIAGNOSIS — I4891 Unspecified atrial fibrillation: Secondary | ICD-10-CM | POA: Diagnosis not present

## 2019-11-12 DIAGNOSIS — Z79899 Other long term (current) drug therapy: Secondary | ICD-10-CM | POA: Diagnosis not present

## 2019-11-12 MED ORDER — DEGARELIX ACETATE 80 MG ~~LOC~~ SOLR
80.0000 mg | Freq: Once | SUBCUTANEOUS | Status: AC
  Administered 2019-11-12: 80 mg via SUBCUTANEOUS
  Filled 2019-11-12: qty 4

## 2019-11-12 NOTE — Progress Notes (Signed)
Patient denies new problems/concerns today.   °
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
## 2019-11-17 ENCOUNTER — Encounter: Payer: Self-pay | Admitting: Adult Health

## 2019-11-17 ENCOUNTER — Other Ambulatory Visit: Payer: Self-pay

## 2019-11-17 ENCOUNTER — Other Ambulatory Visit: Payer: Self-pay | Admitting: *Deleted

## 2019-11-17 ENCOUNTER — Ambulatory Visit (INDEPENDENT_AMBULATORY_CARE_PROVIDER_SITE_OTHER): Payer: Medicare Other | Admitting: Adult Health

## 2019-11-17 VITALS — BP 132/88 | HR 87 | Ht 72.0 in | Wt 244.0 lb

## 2019-11-17 DIAGNOSIS — I4811 Longstanding persistent atrial fibrillation: Secondary | ICD-10-CM | POA: Diagnosis not present

## 2019-11-17 DIAGNOSIS — E1165 Type 2 diabetes mellitus with hyperglycemia: Secondary | ICD-10-CM | POA: Diagnosis not present

## 2019-11-17 DIAGNOSIS — I639 Cerebral infarction, unspecified: Secondary | ICD-10-CM

## 2019-11-17 DIAGNOSIS — E785 Hyperlipidemia, unspecified: Secondary | ICD-10-CM

## 2019-11-17 DIAGNOSIS — Z9189 Other specified personal risk factors, not elsewhere classified: Secondary | ICD-10-CM

## 2019-11-17 DIAGNOSIS — I10 Essential (primary) hypertension: Secondary | ICD-10-CM

## 2019-11-17 DIAGNOSIS — R6889 Other general symptoms and signs: Secondary | ICD-10-CM | POA: Diagnosis not present
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
## 2019-11-17 NOTE — Progress Notes (Signed)
I agree with the above plan 

## 2019-11-17 NOTE — Patient Instructions (Signed)
Continue Eliquis (apixaban) daily  and atorvastatin 10 mg daily for secondary stroke prevention  Continue to follow with cardiology for atrial fibrillation and Eliquis management  Highly recommend being further evaluated for sleep apnea as you are at high risk for with atrial fibrillation and recent stroke. Please call office if you are interested in pursing evaluation in the future   Continue to follow up with PCP regarding cholesterol, blood pressure and diabetes management  Maintain strict control of hypertension with bloodpressure goal below 130/90, diabetes with hemoglobin A1c goal below 7% and cholesterol with LDL cholesterol (bad cholesterol) goal below 70 mg/dL.      Followup in the future with me in 4 months or call earlier if needed      Thank you for coming to see [place] at [facility]. I hope we have been able to provide you high quality care today.  You may receive a patient satisfaction survey over the next few weeks. We would appreciate your feedback and comments so that we may continue to improve ourselves and the health of our patients.    Sleep Apnea Sleep apnea is a condition in which breathing pauses or becomes shallow during sleep. Episodes of sleep apnea usually last 10 seconds or longer, and they may occur as many as 20 times an hour. Sleep apnea disrupts your sleep and keeps your body from getting the rest that it needs. This condition can increase your risk of certain health problems, including:  Heart attack.  Stroke.  Obesity.  Diabetes.  Heart failure.  Irregular heartbeat (atrial fibrillation).  What are the causes? There are three kinds of sleep apnea:  Obstructive sleep apnea. This kind is caused by a blocked or collapsed airway.  Central sleep apnea. This kind happens when the part of the brain that controls breathing does not send the correct signals to the muscles that control breathing.  Mixed sleep apnea. This is a  combination of obstructive and central sleep apnea. The most common cause of this condition is a collapsed or blocked airway. An airway can collapse or become blocked if:  Your throat muscles are abnormally relaxed.  Your tongue and tonsils are larger than normal.  You are overweight.  Your airway is smaller than normal. What increases the risk? You are more likely to develop this condition if you:  Are overweight.  Smoke.  Have a smaller than normal airway.  Are elderly.  Are male.  Drink alcohol.  Take sedatives or tranquilizers.  Have a family history of sleep apnea. What are the signs or symptoms? Symptoms of this condition include:  Trouble staying asleep.  Daytime sleepiness and tiredness.  Irritability.  Loud snoring.  Morning headaches.  Trouble concentrating.  Forgetfulness.  Decreased interest in sex.  Unexplained sleepiness.  Mood swings.  Personality changes.  Feelings of depression.  Waking up often during the night to urinate.  Dry mouth.  Sore throat. How is this diagnosed? This condition may be diagnosed with:  A medical history.  A physical exam.  A series of tests that are done while you are sleeping (sleep study). These tests are usually done in a sleep lab, but they may also be done at home. How is this treated? Treatment for this condition aims to restore normal breathing and to ease symptoms during sleep. It may involve managing health issues that can affect breathing, such as high blood pressure or obesity. Treatment may include:  Sleeping on your side.  Using a decongestant if  you have nasal congestion.  Avoiding the use of depressants, including alcohol, sedatives, and narcotics.  Losing weight if you are overweight.  Making changes to your diet.  Quitting smoking.  Using a device to open your airway while you sleep, such as: ? An oral appliance. This is a custom-made mouthpiece that shifts your lower jaw  forward. ? A continuous positive airway pressure (CPAP) device. This device blows air through a mask when you breathe out (exhale). ? A nasal expiratory positive airway pressure (EPAP) device. This device has valves that you put into each nostril. ? A bi-level positive airway pressure (BPAP) device. This device blows air through a mask when you breathe in (inhale) and breathe out (exhale).  Having surgery if other treatments do not work. During surgery, excess tissue is removed to create a wider airway. It is important to get treatment for sleep apnea. Without treatment, this condition can lead to:  High blood pressure.  Coronary artery disease.  In men, an inability to achieve or maintain an erection (impotence).  Reduced thinking abilities. Follow these instructions at home: Lifestyle  Make any lifestyle changes that your health care provider recommends.  Eat a healthy, well-balanced diet.  Take steps to lose weight if you are overweight.  Avoid using depressants, including alcohol, sedatives, and narcotics.  Do not use any products that contain nicotine or tobacco, such as cigarettes, e-cigarettes, and chewing tobacco. If you need help quitting, ask your health care provider. General instructions  Take over-the-counter and prescription medicines only as told by your health care provider.  If you were given a device to open your airway while you sleep, use it only as told by your health care provider.  If you are having surgery, make sure to tell your health care provider you have sleep apnea. You may need to bring your device with you.  Keep all follow-up visits as told by your health care provider. This is important. Contact a health care provider if:  The device that you received to open your airway during sleep is uncomfortable or does not seem to be working.  Your symptoms do not improve.  Your symptoms get worse. Get help right away if:  You develop: ? Chest  pain. ? Shortness of breath. ? Discomfort in your back, arms, or stomach.  You have: ? Trouble speaking. ? Weakness on one side of your body. ? Drooping in your face. These symptoms may represent a serious problem that is an emergency. Do not wait to see if the symptoms will go away. Get medical help right away. Call your local emergency services (911 in the [place] Do not drive yourself to the hospital. Summary  Sleep apnea is a condition in which breathing pauses or becomes shallow during sleep.  The most common cause is a collapsed or blocked airway.  The goal of treatment is to restore normal breathing and to ease symptoms during sleep. This information is not intended to replace advice given to you by your health care provider. Make sure you discuss any questions you have with your health care provider. Document Revised: 06/24/2018 Document Reviewed: 09/02/2017 Elsevier Patient Education  [place].

## 2019-11-17 NOTE — Patient Outreach (Signed)
[facility]) Care Management  11/17/2019  [name] [birth year] [id number]   Call placed to member's wife to follow up on management of chronic medical conditions.  Report member was seen today by neurologist yesterday, plan to follow up in 4 months.  Per note, he was advised to monitor blood pressure and heart rate daily.  Wife state they do not have a monitor yet but will obtain one.  Appointment with PCP scheduled for 11/9.  She report his prostate cancer has returned, receiving injections every 4 weeks.  Member has gone back to work, denies any urgent concerns.  Will follow up within the next month, encouraged wife to contact this care manager with questions.    Goals Addressed              This Visit's Progress   .  COMPLETED: Recover from stroke, get stronger and go back to work (pt-stated)        CARE PLAN ENTRY (see longitudinal plan of care for additional care plan information)  Current Barriers:  Marland Kitchen Knowledge Deficits related to Stroke  Nurse Case Manager Clinical Goal(s):  Marland Kitchen Over the next 31 days, patient will not experience hospital admission. Hospital Admissions in last 6 months = 1 . Over the next 28 days, patient will attend all scheduled medical appointments: Neurology, PCP, Cardiology, interventional radiology . Over the next 28 days, patient will work with CM clinical social worker to secure transportation and financial concerns  Interventions:  . Inter-disciplinary care team collaboration (see longitudinal plan of care) . Provided education to patient re: stroke recovery . Discussed plans with patient for ongoing care management follow up and provided patient with direct contact information for care management team . Reviewed scheduled/upcoming provider appointments including:  . Social Work referral for community resources  Patient Self Care Activities:  . Attends all scheduled provider appointments . Performs ADL's  independently . Calls provider office for new concerns or questions   UPDATE:  Requested appointment for neurology.  Resolving due to duplicate goal     .  [facility] - Keep or Improve My Strength        Follow Up Date 12//30   - eat healthy to increase strength - increase activity or exercise time a little every week - know who to call for help if I fall - learn how to get up if I fall - plan activity for times when energy is the highest    Why is this important?   Before the stroke you probably did not think much about being safe when you are up and about.  Now, it may be harder for you to get around.  It may also be easier for you to trip or fall.  It is common to have muscle weakness after a stroke. You may also feel like you cannot control an arm or leg.  It will be helpful to work with a physical therapist to get your strength and muscle control back.  It is good to stay as active as you can. Walking and stretching help you stay strong and flexible.  The physical therapist will develop an exercise program just for you.     Notes:     .  [facility] - Track and Manage Heart Rate and Rhythm        Follow Up Date 11/27   - bring symptom diary to all appointments - check pulse (heart) rate once a day - keep all lab appointments  Why is this important?   Atrial fibrillation may have no symptoms. Sometimes the symptoms get worse or happen more often.  It is important to keep track of what your symptoms are and when they happen.  A change in symptoms is important to discuss with your doctor or nurse.  Being active and healthy eating will also help you manage your heart condition.     Notes:       [name], RN, MSN [place] [phone]

## 2019-11-19 ENCOUNTER — Other Ambulatory Visit: Payer: Medicare Other

## 2019-11-19 ENCOUNTER — Other Ambulatory Visit: Payer: Self-pay

## 2019-11-19 DIAGNOSIS — N1832 Chronic kidney disease, stage 3b: Secondary | ICD-10-CM

## 2019-11-19 MED ORDER — SITAGLIPTIN PHOSPHATE 25 MG PO TABS: 25.0000 mg | ORAL_TABLET | Freq: Every day | ORAL | 3 refills | Status: DC

## 2019-11-20 DIAGNOSIS — G4733 Obstructive sleep apnea (adult) (pediatric): Secondary | ICD-10-CM | POA: Diagnosis not present

## 2019-11-23 ENCOUNTER — Ambulatory Visit: Payer: Medicare Other | Admitting: Nurse Practitioner

## 2019-11-26 ENCOUNTER — Other Ambulatory Visit: Payer: Self-pay

## 2019-11-26 ENCOUNTER — Ambulatory Visit (INDEPENDENT_AMBULATORY_CARE_PROVIDER_SITE_OTHER): Payer: Medicare Other

## 2019-11-26 DIAGNOSIS — R140 Abdominal distension (gaseous): Secondary | ICD-10-CM | POA: Diagnosis not present

## 2019-11-30 ENCOUNTER — Ambulatory Visit: Payer: Medicare Other | Admitting: Nurse Practitioner

## 2019-11-30 NOTE — Progress Notes (Signed)
[name] needs follow up for his ultrasound. Has large mass. Should be with [facility]. Thanks .

## 2019-12-01 ENCOUNTER — Other Ambulatory Visit: Payer: Self-pay

## 2019-12-01 NOTE — Patient Outreach (Signed)
[facility]) Care Management  12/01/2019  [name] 15-Jul-1933 [id number]   Telephone outreach to patient to obtain mRS was successfully completed. MRS=0.  [name] under supervision of [place] Management Assistant [phone]

## 2019-12-02 ENCOUNTER — Telehealth: Payer: Self-pay

## 2019-12-02 NOTE — Telephone Encounter (Signed)
Patients wife advised ultrasound results need to be discussed and they will look at his work schedule and call back, this needs to be worked in with dr Humphrey Rolls and I have sent her a message for a good time to discuss these results asap. [name]

## 2019-12-03 ENCOUNTER — Ambulatory Visit (INDEPENDENT_AMBULATORY_CARE_PROVIDER_SITE_OTHER): Payer: Medicare Other | Admitting: Nurse Practitioner

## 2019-12-03 ENCOUNTER — Encounter: Payer: Self-pay | Admitting: Nurse Practitioner

## 2019-12-03 VITALS — Resp 16 | Ht 72.0 in | Wt 242.0 lb

## 2019-12-03 DIAGNOSIS — N2889 Other specified disorders of kidney and ureter: Secondary | ICD-10-CM

## 2019-12-03 NOTE — Progress Notes (Signed)
[facility] [place], [place] [address]  Internal MEDICINE  Telephone Visit  Patient Name: [name]  [id number]  [id number]  Date of Service: 01/02/2020  I connected with the patient at 12:45pm by telephone and verified the patients identity using two identifiers.   I discussed the limitations, risks, security and privacy concerns of performing an evaluation and management service by telephone and the availability of in person appointments. I also discussed with the patient that there may be a patient responsible charge related to the service.  The patient expressed understanding and agrees to proceed.    Chief Complaint  Patient presents with  . Telephone Assessment    discuss ultrasound  . Telephone Screen    The patient has been contacted via telephone for follow up visit due to concerns for spread of novel coronavirus. He recently had abdominal ultrasound. The left kidney was difficult to visualized on ultrasound. A renal mass could not be excluded. A CT for further evaluation was recommended. He is currently being treated for new prostate cancer. He does have a stable abdominal aortic aneurysm. He is followed by vein and vascular for this.       Current Medication: Outpatient Encounter Medications as of 12/03/2019  Medication Sig  . albuterol (VENTOLIN HFA) 108 (90 Base) MCG/ACT inhaler Inhale 2 puffs into the lungs every 4 (four) hours as needed for wheezing or shortness of breath.  Marland Kitchen apixaban (ELIQUIS) 2.5 MG TABS tablet Take 2.5 mg by mouth 2 (two) times daily.  Marland Kitchen atorvastatin (LIPITOR) 10 MG tablet Take 1 tablet (10 mg total) by mouth daily.  . ferrous sulfate 325 (65 FE) MG tablet Take 325 mg by mouth daily.   Marland Kitchen glucose blood (ONETOUCH VERIO) test strip USE ONCE DAILY. DX E11.65.  . metoprolol tartrate (LOPRESSOR) 25 MG tablet Take 1 tablet (25 mg total) by mouth 2 (two) times daily.  . pantoprazole (PROTONIX) 40 MG tablet Take 1 tablet  (40 mg total) by mouth daily.  Marland Kitchen senna-docusate (SENOKOT-S) 8.6-50 MG tablet Take 1 tablet by mouth at bedtime as needed for mild constipation.  . sitaGLIPtin (JANUVIA) 25 MG tablet Take 1 tablet (25 mg total) by mouth daily.  . vitamin B-12 (CYANOCOBALAMIN) 1000 MCG tablet Take 1 tablet (1,000 mcg total) by mouth daily.   No facility-administered encounter medications on file as of 12/03/2019.    Surgical History: Past Surgical History:  Procedure Laterality Date  . CATARACT EXTRACTION    . COLONOSCOPY WITH PROPOFOL N/A 07/02/2019   Procedure: COLONOSCOPY WITH PROPOFOL;  Surgeon: Lin Landsman, MD;  Location: [facility] ENDOSCOPY;  Service: Gastroenterology;  Laterality: N/A;  . IR CT HEAD LTD  08/27/2019  . IR PERCUTANEOUS ART THROMBECTOMY/INFUSION INTRACRANIAL INC DIAG ANGIO  08/27/2019      . IR PERCUTANEOUS ART THROMBECTOMY/INFUSION INTRACRANIAL INC DIAG ANGIO  08/27/2019  . PROSTATE CANCER    . PROSTATE SURGERY    . RADIOLOGY WITH ANESTHESIA N/A 08/27/2019   Procedure: IR WITH ANESTHESIA;  Surgeon: Luanne Bras, MD;  Location: [place];  Service: Radiology;  Laterality: N/A;    Medical History: Past Medical History:  Diagnosis Date  . Atrial fibrillation ([place])   . CHF (congestive heart failure) ([place])   . Diabetes ([place])   . Hearing loss   . High blood pressure   . History of bladder problems   . Kidney stones 11/22/2014  . Prostate cancer ([place])   . Prostate cancer ([place])   . Stroke [facility])  Family History: Family History  Problem Relation Age of Onset  . Colon cancer Mother   . Diabetes Other   . High blood pressure Other   . Prostate cancer Brother   . Diabetes Brother     Social History   Socioeconomic History  . Marital status: Married    Spouse name: Maysin Carstens  . Number of children: 2  . Years of education: 73  . Highest education level: 12th grade  Occupational History  . Occupation: [employer]  Tobacco Use  . Smoking status: Former Smoker    Years:  16.00    Types: Cigarettes  . Smokeless tobacco: Never Used  Vaping Use  . Vaping Use: Never used  Substance and Sexual Activity  . Alcohol use: No  . Drug use: No  . Sexual activity: Not Currently  Other Topics Concern  . Not on file  Social History Narrative  . Not on file   Social Determinants of Health   Financial Resource Strain: Low Risk   . Difficulty of Paying Living Expenses: Not very hard  Food Insecurity: No Food Insecurity  . Worried About Charity fundraiser in the Last Year: Never true  . Ran Out of Food in the Last Year: Never true  Transportation Needs: No Transportation Needs  . Lack of Transportation (Medical): No  . Lack of Transportation (Non-Medical): No  Physical Activity: Inactive  . Days of Exercise per Week: 0 days  . Minutes of Exercise per Session: 0 min  Stress: No Stress Concern Present  . Feeling of Stress : Only a little  Social Connections: Moderately Integrated  . Frequency of Communication with Friends and Family: More than three times a week  . Frequency of Social Gatherings with Friends and Family: More than three times a week  . Attends Religious Services: More than 4 times per year  . Active Member of Clubs or Organizations: No  . Attends Archivist Meetings: Never  . Marital Status: Married  Human resources officer Violence: Not At Risk  . Fear of Current or Ex-Partner: No  . Emotionally Abused: No  . Physically Abused: No  . Sexually Abused: No      Review of Systems  Constitutional: Positive for fatigue. Negative for activity change, chills and unexpected weight change.  HENT: Negative for congestion, postnasal drip, rhinorrhea, sneezing and sore throat.   Respiratory: Negative for cough, chest tightness, shortness of breath and wheezing.   Cardiovascular: Negative for chest pain and palpitations.  Gastrointestinal: Positive for abdominal distention and constipation. Negative for abdominal pain, diarrhea, nausea and  vomiting.       Improved symptoms   Endocrine: Negative for cold intolerance, heat intolerance, polydipsia and polyuria.       Improved blood sugars .  Musculoskeletal: Negative for arthralgias, back pain, joint swelling and neck pain.  Skin: Negative for rash.  Allergic/Immunologic: Negative for environmental allergies.  Neurological: Positive for weakness. Negative for dizziness, tremors, numbness and headaches.  Hematological: Negative for adenopathy. Does not bruise/bleed easily.  Psychiatric/Behavioral: Negative for behavioral problems (Depression), sleep disturbance and suicidal ideas. The patient is not nervous/anxious.     Today's Vitals   12/03/19 1151  Resp: 16  Weight: 242 lb (109.8 kg)  Height: 6' (1.829 m)   Body mass index is 32.82 kg/m.  Observation/Objective:  The patient is alert and oriented. He is pleasant and answering all questions appropriately. Breathing is non-labored. He is in no acute distress.    Assessment/Plan: 1. Renal  mass, left Reviewed u/s of abdomen with the patient and his wife. The left kidney was difficult to visualized on ultrasound. A renal mass could not be excluded. He does have a stable abdominal aortic aneurysm. He is followed by vein and vascular for this. Will get a CT of the abdomen for further evaluation.  - CT ABDOMEN W CONTRAST; Future  2. Other specified disorders of kidney and ureter Reviewed u/s of abdomen with the patient and his wife. The left kidney was difficult to visualized on ultrasound. A renal mass could not be excluded. He does have a stable abdominal aortic aneurysm. He is followed by vein and vascular for this. Will get a CT of the abdomen for further evaluation.  - CT ABDOMEN W CONTRAST; Future  General Counseling: dimitri shakespeare understanding of the findings of today's phone visit and agrees with plan of treatment. I have discussed any further diagnostic evaluation that may be needed or ordered today. We also  reviewed his medications today. he has been encouraged to call the office with any questions or concerns that should arise related to todays visit.   This patient was seen by Leretha Pol FNP Collaboration with Dr Lavera Guise as a part of collaborative care agreement  Orders Placed This Encounter  Procedures  . CT ABDOMEN W CONTRAST    Time spent: 25 Minutes    Dr Lavera Guise Internal medicine

## 2019-12-06 ENCOUNTER — Telehealth: Payer: Self-pay

## 2019-12-06 ENCOUNTER — Other Ambulatory Visit: Payer: Self-pay

## 2019-12-06 DIAGNOSIS — N2889 Other specified disorders of kidney and ureter: Secondary | ICD-10-CM

## 2019-12-06 NOTE — Telephone Encounter (Signed)
Left a detailed message advising pt of ct appt. [name] 

## 2019-12-08 ENCOUNTER — Other Ambulatory Visit: Payer: Self-pay

## 2019-12-08 ENCOUNTER — Inpatient Hospital Stay: Payer: Medicare Other | Attending: Oncology

## 2019-12-08 DIAGNOSIS — R590 Localized enlarged lymph nodes: Secondary | ICD-10-CM | POA: Insufficient documentation

## 2019-12-08 DIAGNOSIS — Z79899 Other long term (current) drug therapy: Secondary | ICD-10-CM | POA: Diagnosis not present

## 2019-12-08 DIAGNOSIS — Z923 Personal history of irradiation: Secondary | ICD-10-CM | POA: Diagnosis not present

## 2019-12-08 DIAGNOSIS — C61 Malignant neoplasm of prostate: Secondary | ICD-10-CM | POA: Insufficient documentation

## 2019-12-08 DIAGNOSIS — C772 Secondary and unspecified malignant neoplasm of intra-abdominal lymph nodes: Secondary | ICD-10-CM | POA: Diagnosis not present

## 2019-12-08 DIAGNOSIS — Z9079 Acquired absence of other genital organ(s): Secondary | ICD-10-CM | POA: Insufficient documentation

## 2019-12-08 LAB — COMPREHENSIVE METABOLIC PANEL
ALT: 20 U/L (ref 0–44)
AST: 19 U/L (ref 15–41)
Albumin: 3.6 g/dL (ref 3.5–5.0)
Alkaline Phosphatase: 111 U/L (ref 38–126)
Anion gap: 9 (ref 5–15)
BUN: 35 mg/dL — ABNORMAL HIGH (ref 8–23)
CO2: 25 mmol/L (ref 22–32)
Calcium: 9.3 mg/dL (ref 8.9–10.3)
Chloride: 106 mmol/L (ref 98–111)
Creatinine, Ser: 1.94 mg/dL — ABNORMAL HIGH (ref 0.61–1.24)
GFR, Estimated: 34 mL/min — ABNORMAL LOW (ref >60)
Glucose, Bld: 129 mg/dL — ABNORMAL HIGH (ref 70–99)
Potassium: 4.7 mmol/L (ref 3.5–5.1)
Sodium: 140 mmol/L (ref 135–145)
Total Bilirubin: 0.7 mg/dL (ref 0.3–1.2)
Total Protein: 7.4 g/dL (ref 6.5–8.1)

## 2019-12-08 LAB — CBC WITH DIFFERENTIAL/PLATELET
Abs Immature Granulocytes: 0.01 10*3/uL (ref 0.00–0.07)
Basophils Absolute: 0 10*3/uL (ref 0.0–0.1)
Basophils Relative: 1 %
Eosinophils Absolute: 0.3 10*3/uL (ref 0.0–0.5)
Eosinophils Relative: 5 %
HCT: 44.3 % (ref 39.0–52.0)
Hemoglobin: 13.8 g/dL (ref 13.0–17.0)
Immature Granulocytes: 0 %
Lymphocytes Relative: 36 %
Lymphs Abs: 2 10*3/uL (ref 0.7–4.0)
MCH: 27.3 pg (ref 26.0–34.0)
MCHC: 31.2 g/dL (ref 30.0–36.0)
MCV: 87.5 fL (ref 80.0–100.0)
Monocytes Absolute: 1 10*3/uL (ref 0.1–1.0)
Monocytes Relative: 18 %
Neutro Abs: 2.3 10*3/uL (ref 1.7–7.7)
Neutrophils Relative %: 40 %
Platelets: 154 10*3/uL (ref 150–400)
RBC: 5.06 MIL/uL (ref 4.22–5.81)
RDW: 16.6 % — ABNORMAL HIGH (ref 11.5–15.5)
WBC: 5.6 10*3/uL (ref 4.0–10.5)
nRBC: 0 % (ref 0.0–0.2)

## 2019-12-08 LAB — PSA: Prostatic Specific Antigen: 18.25 ng/mL — ABNORMAL HIGH (ref 0.00–4.00)

## 2019-12-09 LAB — TESTOSTERONE: Testosterone: 22 ng/dL — ABNORMAL LOW (ref 264–916)

## 2019-12-10 ENCOUNTER — Inpatient Hospital Stay: Payer: Medicare Other

## 2019-12-10 ENCOUNTER — Inpatient Hospital Stay (HOSPITAL_BASED_OUTPATIENT_CLINIC_OR_DEPARTMENT_OTHER): Payer: Medicare Other | Admitting: Oncology

## 2019-12-10 ENCOUNTER — Other Ambulatory Visit: Payer: Self-pay

## 2019-12-10 ENCOUNTER — Encounter: Payer: Self-pay | Admitting: Oncology

## 2019-12-10 VITALS — BP 127/82 | HR 73 | Temp 96.8°F | Resp 18 | Wt 247.0 lb

## 2019-12-10 DIAGNOSIS — Z79818 Long term (current) use of other agents affecting estrogen receptors and estrogen levels: Secondary | ICD-10-CM | POA: Diagnosis not present

## 2019-12-10 DIAGNOSIS — C61 Malignant neoplasm of prostate: Secondary | ICD-10-CM | POA: Diagnosis not present

## 2019-12-10 DIAGNOSIS — C772 Secondary and unspecified malignant neoplasm of intra-abdominal lymph nodes: Secondary | ICD-10-CM | POA: Diagnosis not present

## 2019-12-10 DIAGNOSIS — R590 Localized enlarged lymph nodes: Secondary | ICD-10-CM | POA: Diagnosis not present

## 2019-12-10 DIAGNOSIS — Z79899 Other long term (current) drug therapy: Secondary | ICD-10-CM | POA: Diagnosis not present

## 2019-12-10 DIAGNOSIS — Z923 Personal history of irradiation: Secondary | ICD-10-CM | POA: Diagnosis not present

## 2019-12-10 MED ORDER — DEGARELIX ACETATE 80 MG ~~LOC~~ SOLR
80.0000 mg | Freq: Once | SUBCUTANEOUS | Status: AC
  Administered 2019-12-10: 80 mg via SUBCUTANEOUS
  Filled 2019-12-10: qty 4
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
## 2019-12-10 NOTE — Progress Notes (Signed)
Patient denies new problems/concerns today.   °

## 2019-12-13 ENCOUNTER — Telehealth: Payer: Self-pay

## 2019-12-13 NOTE — Telephone Encounter (Signed)
Home health orders signed and faxed back to [facility] at [phone]. Copy placed in scan.

## 2019-12-14 ENCOUNTER — Other Ambulatory Visit: Payer: Self-pay | Admitting: *Deleted

## 2019-12-14 NOTE — Patient Outreach (Signed)
[place] [facility]) Care Management  12/14/2019  DESMON HITCHNER 04-Jun-1933 [id number]   Call placed to member's wife, no answer, HIPAA compliant voice message left.  Will follow up within the next 3-4 business days.  [name], [place], MSN [place] [phone]

## 2019-12-15 ENCOUNTER — Other Ambulatory Visit: Payer: Self-pay | Admitting: *Deleted

## 2019-12-15 NOTE — Patient Outreach (Signed)
[place] [facility]) Care Management  12/15/2019  [name] [birth year] [id number]   Voice message received from wife after missed call yesterday.  Call placed back to wife, state member is "doing."  State he continues to work despite her wanting him to rest more.  He has restarted treatment for recurring prostate cancer.  Report oncologist would like to start on Xtandi but need clearance from cardiologist.  Member has appointment scheduled for 12/3, will discuss at that time.  She report there is also concern about his kidneys, will have CT done on 11/29.  Unsure if her daughter in law will provide transportation, will contact [facility] CSW directly if transportation is needed.  He will also follow up with oncologist on 12/17.  Denies any urgent concerns, encouraged to contact this care manager with question.  This care manager will follow up with member/wife within the next month.  Goals Addressed            This Visit's Progress   . [facility] - Keep or Improve My Strength   On track    Follow Up Date 12//30   - eat healthy to increase strength - increase activity or exercise time a little every week - know who to call for help if I fall - learn how to get up if I fall - plan activity for times when energy is the highest    Why is this important?   Before the stroke you probably did not think much about being safe when you are up and about.  Now, it may be harder for you to get around.  It may also be easier for you to trip or fall.  It is common to have muscle weakness after a stroke. You may also feel like you cannot control an arm or leg.  It will be helpful to work with a physical therapist to get your strength and muscle control back.  It is good to stay as active as you can. Walking and stretching help you stay strong and flexible.  The physical therapist will develop an exercise program just for you.     Notes:   11/24 - discussed increasing activity, increasing hours  at work    . [facility] - Track and Manage Heart Rate and Rhythm   On track    Follow Up Date 11/27   - bring symptom diary to all appointments - check pulse (heart) rate once a day - keep all lab appointments    Why is this important?   Atrial fibrillation may have no symptoms. Sometimes the symptoms get worse or happen more often.  It is important to keep track of what your symptoms are and when they happen.  A change in symptoms is important to discuss with your doctor or nurse.  Being active and healthy eating will also help you manage your heart condition.     Notes:   11/24 - Reviewed upcoming appointment with cardiology, encouraged to monitor HR and BP and take trends for review      [name], RN, MSN [place] [phone]

## 2019-12-20 ENCOUNTER — Ambulatory Visit
Admission: RE | Admit: 2019-12-20 | Discharge: 2019-12-20 | Disposition: A | Discharge status: Home / Self Care | Payer: Medicare Other | Source: Ambulatory Visit | Attending: Internal Medicine | Admitting: Internal Medicine

## 2019-12-20 ENCOUNTER — Other Ambulatory Visit: Payer: Self-pay

## 2019-12-20 ENCOUNTER — Ambulatory Visit: Payer: Self-pay | Admitting: *Deleted

## 2019-12-20 DIAGNOSIS — N2889 Other specified disorders of kidney and ureter: Secondary | ICD-10-CM | POA: Insufficient documentation

## 2019-12-20 DIAGNOSIS — I714 Abdominal aortic aneurysm, without rupture: Secondary | ICD-10-CM | POA: Diagnosis not present

## 2019-12-20 DIAGNOSIS — R6889 Other general symptoms and signs: Secondary | ICD-10-CM | POA: Diagnosis not present

## 2019-12-20 MED ORDER — IOHEXOL 300 MG/ML  SOLN
100.0000 mL | Freq: Once | INTRAMUSCULAR | Status: AC | PRN
  Administered 2019-12-20: 85 mL via INTRAVENOUS

## 2019-12-21 DIAGNOSIS — G4733 Obstructive sleep apnea (adult) (pediatric): Secondary | ICD-10-CM | POA: Diagnosis not present

## 2019-12-21 NOTE — Progress Notes (Signed)
Pt needs follow up in 2 Months with TH or [facility]

## 2019-12-24 DIAGNOSIS — G4733 Obstructive sleep apnea (adult) (pediatric): Secondary | ICD-10-CM | POA: Diagnosis not present

## 2019-12-24 DIAGNOSIS — I5032 Chronic diastolic (congestive) heart failure: Secondary | ICD-10-CM | POA: Diagnosis not present

## 2019-12-24 DIAGNOSIS — I10 Essential (primary) hypertension: Secondary | ICD-10-CM | POA: Diagnosis not present

## 2019-12-24 DIAGNOSIS — I482 Chronic atrial fibrillation, unspecified: Secondary | ICD-10-CM | POA: Diagnosis not present

## 2019-12-24 DIAGNOSIS — E782 Mixed hyperlipidemia: Secondary | ICD-10-CM | POA: Diagnosis not present

## 2019-12-24 DIAGNOSIS — R6889 Other general symptoms and signs: Secondary | ICD-10-CM | POA: Diagnosis not present

## 2020-01-02 DIAGNOSIS — N2889 Other specified disorders of kidney and ureter: Secondary | ICD-10-CM | POA: Insufficient documentation

## 2020-01-05 ENCOUNTER — Inpatient Hospital Stay: Payer: Medicare Other | Attending: Oncology

## 2020-01-05 ENCOUNTER — Other Ambulatory Visit: Payer: Self-pay

## 2020-01-05 DIAGNOSIS — Z79899 Other long term (current) drug therapy: Secondary | ICD-10-CM | POA: Insufficient documentation

## 2020-01-05 DIAGNOSIS — Z923 Personal history of irradiation: Secondary | ICD-10-CM | POA: Diagnosis not present

## 2020-01-05 DIAGNOSIS — C61 Malignant neoplasm of prostate: Secondary | ICD-10-CM | POA: Insufficient documentation

## 2020-01-05 DIAGNOSIS — C772 Secondary and unspecified malignant neoplasm of intra-abdominal lymph nodes: Secondary | ICD-10-CM | POA: Diagnosis not present

## 2020-01-05 DIAGNOSIS — Z9079 Acquired absence of other genital organ(s): Secondary | ICD-10-CM | POA: Diagnosis not present

## 2020-01-05 DIAGNOSIS — R590 Localized enlarged lymph nodes: Secondary | ICD-10-CM | POA: Insufficient documentation

## 2020-01-05 LAB — CBC WITH DIFFERENTIAL/PLATELET
Abs Immature Granulocytes: 0.02 10*3/uL (ref 0.00–0.07)
Basophils Absolute: 0 10*3/uL (ref 0.0–0.1)
Basophils Relative: 1 %
Eosinophils Absolute: 0.1 10*3/uL (ref 0.0–0.5)
Eosinophils Relative: 2 %
HCT: 44.2 % (ref 39.0–52.0)
Hemoglobin: 13.8 g/dL (ref 13.0–17.0)
Immature Granulocytes: 0 %
Lymphocytes Relative: 26 %
Lymphs Abs: 1.5 10*3/uL (ref 0.7–4.0)
MCH: 27.7 pg (ref 26.0–34.0)
MCHC: 31.2 g/dL (ref 30.0–36.0)
MCV: 88.8 fL (ref 80.0–100.0)
Monocytes Absolute: 0.7 10*3/uL (ref 0.1–1.0)
Monocytes Relative: 13 %
Neutro Abs: 3.2 10*3/uL (ref 1.7–7.7)
Neutrophils Relative %: 58 %
Platelets: 185 10*3/uL (ref 150–400)
RBC: 4.98 MIL/uL (ref 4.22–5.81)
RDW: 17.1 % — ABNORMAL HIGH (ref 11.5–15.5)
WBC: 5.5 10*3/uL (ref 4.0–10.5)
nRBC: 0 % (ref 0.0–0.2)

## 2020-01-05 LAB — COMPREHENSIVE METABOLIC PANEL
ALT: 20 U/L (ref 0–44)
AST: 22 U/L (ref 15–41)
Albumin: 3.9 g/dL (ref 3.5–5.0)
Alkaline Phosphatase: 107 U/L (ref 38–126)
Anion gap: 10 (ref 5–15)
BUN: 36 mg/dL — ABNORMAL HIGH (ref 8–23)
CO2: 26 mmol/L (ref 22–32)
Calcium: 9.7 mg/dL (ref 8.9–10.3)
Chloride: 103 mmol/L (ref 98–111)
Creatinine, Ser: 2.04 mg/dL — ABNORMAL HIGH (ref 0.61–1.24)
GFR, Estimated: 32 mL/min — ABNORMAL LOW (ref >60)
Glucose, Bld: 162 mg/dL — ABNORMAL HIGH (ref 70–99)
Potassium: 4.8 mmol/L (ref 3.5–5.1)
Sodium: 139 mmol/L (ref 135–145)
Total Bilirubin: 0.7 mg/dL (ref 0.3–1.2)
Total Protein: 7.9 g/dL (ref 6.5–8.1)

## 2020-01-05 LAB — PSA: Prostatic Specific Antigen: 22.41 ng/mL — ABNORMAL HIGH (ref 0.00–4.00)

## 2020-01-06 LAB — TESTOSTERONE: Testosterone: 37 ng/dL — ABNORMAL LOW (ref 264–916)

## 2020-01-07 ENCOUNTER — Encounter: Payer: Self-pay | Admitting: Oncology

## 2020-01-07 ENCOUNTER — Inpatient Hospital Stay: Payer: Medicare Other

## 2020-01-07 ENCOUNTER — Inpatient Hospital Stay (HOSPITAL_BASED_OUTPATIENT_CLINIC_OR_DEPARTMENT_OTHER): Payer: Medicare Other | Admitting: Oncology

## 2020-01-07 ENCOUNTER — Other Ambulatory Visit: Payer: Self-pay

## 2020-01-07 VITALS — BP 141/81 | HR 62 | Temp 96.3°F | Resp 16 | Wt 251.1 lb

## 2020-01-07 DIAGNOSIS — C61 Malignant neoplasm of prostate: Secondary | ICD-10-CM

## 2020-01-07 DIAGNOSIS — Z79818 Long term (current) use of other agents affecting estrogen receptors and estrogen levels: Secondary | ICD-10-CM | POA: Diagnosis not present

## 2020-01-07 DIAGNOSIS — Z923 Personal history of irradiation: Secondary | ICD-10-CM | POA: Diagnosis not present

## 2020-01-07 DIAGNOSIS — R590 Localized enlarged lymph nodes: Secondary | ICD-10-CM

## 2020-01-07 DIAGNOSIS — C772 Secondary and unspecified malignant neoplasm of intra-abdominal lymph nodes: Secondary | ICD-10-CM | POA: Diagnosis not present

## 2020-01-07 DIAGNOSIS — Z79899 Other long term (current) drug therapy: Secondary | ICD-10-CM | POA: Diagnosis not present

## 2020-01-07 MED ORDER — DEGARELIX ACETATE 80 MG ~~LOC~~ SOLR
80.0000 mg | Freq: Once | SUBCUTANEOUS | Status: AC
  Administered 2020-01-07: 12:00:00 80 mg via SUBCUTANEOUS
  Filled 2020-01-07: qty 4

## 2020-01-07 NOTE — Progress Notes (Signed)
Patient denies new problems/concerns today.   °
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
## 2020-01-10 ENCOUNTER — Other Ambulatory Visit: Payer: Self-pay

## 2020-01-10 MED ORDER — VITAMIN B-12 1000 MCG PO TABS: 1000.0000 ug | ORAL_TABLET | Freq: Every day | ORAL | 0 refills | Status: DC

## 2020-01-12 ENCOUNTER — Other Ambulatory Visit: Payer: Self-pay | Admitting: *Deleted

## 2020-01-12 NOTE — Patient Outreach (Signed)
[place] [facility]) Care Management  [place]  01/12/2020   [name] 1933/08/13 [id number]  Call placed to member's wife to follow up on ongoing recovery from stroke and A-fib management.  She report he is still working most days 8-5, taking some Fridays off.  Denies he has continued to have any ongoing issues but admits that she is not monitoring his BP and HR daily.  She is trying to monitor his diet (low salt, low sugar, and low cholesterol) as much as possible but he still eat some things he shouldn't.  He was seen by PCP last week, will have follow up in 3 months.  Was seen by cardiology on 12/3, giving permission to start Xtandi but member and wife are still skeptical.  They will consider and follow up with oncologist in the next 3 months.  Denies any urgent concerns, encouraged to contact this care manager with questions.     Encounter Medications:  Outpatient Encounter Medications as of 01/12/2020  Medication Sig  . albuterol (VENTOLIN HFA) 108 (90 Base) MCG/ACT inhaler Inhale 2 puffs into the lungs every 4 (four) hours as needed for wheezing or shortness of breath.  Marland Kitchen apixaban (ELIQUIS) 2.5 MG TABS tablet Take 2.5 mg by mouth 2 (two) times daily.  Marland Kitchen atorvastatin (LIPITOR) 10 MG tablet Take 1 tablet (10 mg total) by mouth daily.  . ferrous sulfate 325 (65 FE) MG tablet Take 325 mg by mouth daily.   Marland Kitchen glucose blood (ONETOUCH VERIO) test strip USE ONCE DAILY. DX E11.65.  . metoprolol tartrate (LOPRESSOR) 25 MG tablet Take 1 tablet (25 mg total) by mouth 2 (two) times daily.  . pantoprazole (PROTONIX) 40 MG tablet Take 1 tablet (40 mg total) by mouth daily.  Marland Kitchen senna-docusate (SENOKOT-S) 8.6-50 MG tablet Take 1 tablet by mouth at bedtime as needed for mild constipation.  . sitaGLIPtin (JANUVIA) 25 MG tablet Take 1 tablet (25 mg total) by mouth daily.  . vitamin B-12 (CYANOCOBALAMIN) 1000 MCG tablet Take 1 tablet (1,000 mcg total) by mouth daily.   No  facility-administered encounter medications on file as of 01/12/2020.    Functional Status:  In your present state of health, do you have any difficulty performing the following activities: 10/29/2019 10/15/2019  Hearing? N N  Comment - -  Vision? N N  Difficulty concentrating or making decisions? N N  Walking or climbing stairs? N N  Comment - -  Dressing or bathing? N N  Comment - -  Doing errands, shopping? N N  Preparing Food and eating ? N N  Using the Toilet? N N  In the past six months, have you accidently leaked urine? Y Y  Comment Urinary Incontinence. -  Do you have problems with loss of bowel control? N N  Managing your Medications? N N  Managing your Finances? N N  Housekeeping or managing your Housekeeping? N N  Some recent data might be hidden    Fall/Depression Screening: Fall Risk  12/03/2019 10/29/2019 10/29/2019  Falls in the past year? 0 0 0  Number falls in past yr: - 0 -  Injury with Fall? - 0 -  Comment - - -  Risk for fall due to : - No Fall Risks -  Follow up - Education provided;Falls prevention discussed -   PHQ 2/9 Scores 12/03/2019 11/17/2019 10/29/2019 10/15/2019 09/08/2019 09/07/2019 06/29/2019  PHQ - 2 Score 0 0 0 0 0 - 0  Exception Documentation - - Other- indicate  reason in comment box - Other- indicate reason in comment box Other- indicate reason in comment box -  Not completed - - According to wife and primary caregiver who performed the assessment. - Wife completed assessment. - -    Assessment:  Goals Addressed            This Visit's Progress   . [facility] - Keep or Improve My Strength   On track    Follow Up Date 02/08/2020  Timeframe:  Long-Range Goal Priority:  Medium Start Date:        01/12/2020                     Expected End Date:         02/12/2020                - eat healthy to increase strength - increase activity or exercise time a little every week - know who to call for help if I fall - learn how to get up if I fall - plan  activity for times when energy is the highest    Why is this important?   Before the stroke you probably did not think much about being safe when you are up and about.  Now, it may be harder for you to get around.  It may also be easier for you to trip or fall.  It is common to have muscle weakness after a stroke. You may also feel like you cannot control an arm or leg.  It will be helpful to work with a physical therapist to get your strength and muscle control back.  It is good to stay as active as you can. Walking and stretching help you stay strong and flexible.  The physical therapist will develop an exercise program just for you.     Notes:   11/24 - discussed increasing activity, increasing hours at work     . [facility] - Track and Manage Heart Rate and Rhythm       Follow Up Date 02/08/2020  Timeframe:  Long-Range Goal Priority:  High Start Date:    01/12/2020                      Expected End Date:       03/12/2020                  - bring symptom diary to all appointments - check pulse (heart) rate once a day - keep all lab appointments    Why is this important?   Atrial fibrillation may have no symptoms. Sometimes the symptoms get worse or happen more often.  It is important to keep track of what your symptoms are and when they happen.  A change in symptoms is important to discuss with your doctor or nurse.  Being active and healthy eating will also help you manage your heart condition.     Notes:   11/24 - Reviewed upcoming appointment with cardiology, encouraged to monitor HR and BP and take trends for review  12/22 - Will send BP monitor to home for daily monitoring        Plan:  Follow-up:  Patient agrees to Care Plan and Follow-up.  Will send BP monitor and follow up within the next 2 weeks.  [name], [place], MSN [place] [phone]

## 2020-01-20 DIAGNOSIS — G4733 Obstructive sleep apnea (adult) (pediatric): Secondary | ICD-10-CM | POA: Diagnosis not present

## 2020-01-26 ENCOUNTER — Other Ambulatory Visit: Payer: Self-pay | Admitting: *Deleted

## 2020-01-26 NOTE — Patient Outreach (Signed)
[place] [facility]) Care Management  01/26/2020  PETRO TALENT March 13, 1933 [id number]   Call placed to member's wife to confirm they have received blood pressure monitor.  She verifies it has arrived but hasn't used it yet.  Will have daughter in law come over this weekend to provide education on use.  If unable to do so, will call this care manager back next week for instructions.  Encouraged to record readings and take trends to MD for BP management.  Denies any urgent concerns, will send education regarding BP monitoring and follow up within the next month.  [name], [place], MSN [place] [phone]

## 2020-01-26 NOTE — Patient Instructions (Signed)
Blood Pressure Record Sheet To take your blood pressure, you will need a blood pressure machine. You can buy a blood pressure machine (blood pressure monitor) at your clinic, drug store, or online. When choosing one, consider:  An automatic monitor that has an arm cuff.  A cuff that wraps snugly around your upper arm. You should be able to fit only one finger between your arm and the cuff.  A device that stores blood pressure reading results.  Do not choose a monitor that measures your blood pressure from your wrist or finger. Follow your health care provider's instructions for how to take your blood pressure. To use this form:  Get one reading in the morning (a.m.) before you take any medicines.  Get one reading in the evening (p.m.) before supper.  Take at least 2 readings with each blood pressure check. This makes sure the results are correct. Wait 1-2 minutes between measurements.  Write down the results in the spaces on this form.  Repeat this once a week, or as told by your health care provider.  Make a follow-up appointment with your health care provider to discuss the results. Blood pressure log Date: _______________________  a.m. _____________________(1st reading) _____________________(2nd reading)  p.m. _____________________(1st reading) _____________________(2nd reading) Date: _______________________  a.m. _____________________(1st reading) _____________________(2nd reading)  p.m. _____________________(1st reading) _____________________(2nd reading) Date: _______________________  a.m. _____________________(1st reading) _____________________(2nd reading)  p.m. _____________________(1st reading) _____________________(2nd reading) Date: _______________________  a.m. _____________________(1st reading) _____________________(2nd reading)  p.m. _____________________(1st reading) _____________________(2nd reading) Date: _______________________  a.m.  _____________________(1st reading) _____________________(2nd reading)  p.m. _____________________(1st reading) _____________________(2nd reading) This information is not intended to replace advice given to you by your health care provider. Make sure you discuss any questions you have with your health care provider. Document Revised: 03/07/2017 Document Reviewed: 01/07/2017 Elsevier Patient Education  [place]. How to Take Your Blood Pressure You can take your blood pressure at home with a machine. You may need to check your blood pressure at home:  To check if you have high blood pressure (hypertension).  To check your blood pressure over time.  To make sure your blood pressure medicine is working. Supplies needed: You will need a blood pressure machine, or monitor. You can buy one at a drugstore or online. When choosing one:  Choose one with an arm cuff.  Choose one that wraps around your upper arm. Only one finger should fit between your arm and the cuff.  Do not choose one that measures your blood pressure from your wrist or finger. Your doctor can suggest a monitor. How to prepare Avoid these things for 30 minutes before checking your blood pressure:  Drinking caffeine.  Drinking alcohol.  Eating.  Smoking.  Exercising. Five minutes before checking your blood pressure:  Pee.  Sit in a dining chair. Avoid sitting in a soft couch or armchair.  Be quiet. Do not talk. How to take your blood pressure Follow the instructions that came with your machine. If you have a digital blood pressure monitor, these may be the instructions: 1. Sit up straight. 2. Place your feet on the floor. Do not cross your ankles or legs. 3. Rest your left arm at the level of your heart. You may rest it on a table, desk, or chair. 4. Pull up your shirt sleeve. 5. Wrap the blood pressure cuff around the upper part of your left arm. The cuff should be 1 inch (2.5 cm) above your elbow. It  is best to wrap the cuff  around bare skin. 6. Fit the cuff snugly around your arm. You should be able to place only one finger between the cuff and your arm. 7. Put the cord inside the groove of your elbow. 8. Press the power button. 9. Sit quietly while the cuff fills with air and loses air. 10. Write down the numbers on the screen. 11. Wait 2-3 minutes and then repeat steps 1-10. What do the numbers mean? Two numbers make up your blood pressure. The first number is called systolic pressure. The second is called diastolic pressure. An example of a blood pressure reading is "120 over 80" (or 120/80). If you are an adult and do not have a medical condition, use this guide to find out if your blood pressure is normal: Normal  First number: below 120.  Second number: below 80. Elevated  First number: 120-129.  Second number: below 80. Hypertension stage 1  First number: 130-139.  Second number: 80-89. Hypertension stage 2  First number: 140 or above.  Second number: 66 or above. Your blood pressure is above normal even if only the top or bottom number is above normal. Follow these instructions at home:  Check your blood pressure as often as your doctor tells you to.  Take your monitor to your next doctor's appointment. Your doctor will: ? Make sure you are using it correctly. ? Make sure it is working right.  Make sure you understand what your blood pressure numbers should be.  Tell your doctor if your medicines are causing side effects. Contact a doctor if:  Your blood pressure keeps being high. Get help right away if:  Your first blood pressure number is higher than 180.  Your second blood pressure number is higher than 120. This information is not intended to replace advice given to you by your health care provider. Make sure you discuss any questions you have with your health care provider. Document Revised: 12/20/2016 Document Reviewed: 06/16/2015 Elsevier  Patient Education  2020 [employer].

## 2020-02-04 ENCOUNTER — Ambulatory Visit (INDEPENDENT_AMBULATORY_CARE_PROVIDER_SITE_OTHER): Payer: Medicare Other | Admitting: Nurse Practitioner

## 2020-02-04 ENCOUNTER — Ambulatory Visit (INDEPENDENT_AMBULATORY_CARE_PROVIDER_SITE_OTHER): Payer: Medicare Other | Admitting: Vascular Surgery

## 2020-02-04 ENCOUNTER — Inpatient Hospital Stay: Payer: Medicare Other | Attending: Oncology

## 2020-02-04 ENCOUNTER — Other Ambulatory Visit: Payer: Self-pay

## 2020-02-04 ENCOUNTER — Encounter: Payer: Self-pay | Admitting: Nurse Practitioner

## 2020-02-04 ENCOUNTER — Other Ambulatory Visit (INDEPENDENT_AMBULATORY_CARE_PROVIDER_SITE_OTHER): Payer: Medicare Other

## 2020-02-04 VITALS — BP 138/83 | HR 99 | Temp 97.5°F | Resp 16 | Ht 72.0 in | Wt 253.4 lb

## 2020-02-04 DIAGNOSIS — N1832 Chronic kidney disease, stage 3b: Secondary | ICD-10-CM

## 2020-02-04 DIAGNOSIS — I4891 Unspecified atrial fibrillation: Secondary | ICD-10-CM

## 2020-02-04 DIAGNOSIS — I10 Essential (primary) hypertension: Secondary | ICD-10-CM | POA: Diagnosis not present

## 2020-02-04 DIAGNOSIS — C61 Malignant neoplasm of prostate: Secondary | ICD-10-CM

## 2020-02-04 DIAGNOSIS — E1122 Type 2 diabetes mellitus with diabetic chronic kidney disease: Secondary | ICD-10-CM | POA: Diagnosis not present

## 2020-02-04 LAB — POCT GLYCOSYLATED HEMOGLOBIN (HGB A1C): Hemoglobin A1C: 7.7 % — AB (ref 4.0–5.6)

## 2020-02-04 MED ORDER — TOUJEO MAX SOLOSTAR 300 UNIT/ML ~~LOC~~ SOPN: 10.0000 [IU] | PEN_INJECTOR | Freq: Every day | SUBCUTANEOUS | 3 refills | Status: DC

## 2020-02-04 MED ORDER — "PEN NEEDLES 3/16"" 31G X 5 MM MISC": 3 refills | Status: DC

## 2020-02-04 NOTE — Progress Notes (Signed)
[facility] [place], [place] [address]  Internal MEDICINE  Office Visit Note  Patient Name: [name]  [id number]  [id number]  Date of Service: 02/04/2020  Chief Complaint  Patient presents with  . Follow-up    Discuss A1C    . Diabetes  . Hypertension    The patient lis here for follow up visit.  -blood sugars have been very elevated. He states that the blood sugars are high and won't come down. Currently taking januvia 25mg  daily.  -his HgbA1c is 7.7 today, up from 6.4 at his most recent check.  -patient has stage 3 chronic kidney disease.  -treated for persistent a-fib -currently being treated for recurrence of prostate cancer.      Current Medication: Outpatient Encounter Medications as of 02/04/2020  Medication Sig  . albuterol (VENTOLIN HFA) 108 (90 Base) MCG/ACT inhaler Inhale 2 puffs into the lungs every 4 (four) hours as needed for wheezing or shortness of breath.  Marland Kitchen apixaban (ELIQUIS) 2.5 MG TABS tablet Take 2.5 mg by mouth 2 (two) times daily.  Marland Kitchen atorvastatin (LIPITOR) 10 MG tablet Take 1 tablet (10 mg total) by mouth daily.  . ferrous sulfate 325 (65 FE) MG tablet Take 325 mg by mouth daily.   Marland Kitchen glucose blood (ONETOUCH VERIO) test strip USE ONCE DAILY. DX E11.65.  Marland Kitchen insulin glargine, 2 Unit Dial, (TOUJEO MAX SOLOSTAR) 300 UNIT/ML Solostar Pen Inject 10 Units into the skin daily.  . Insulin Pen Needle (PEN NEEDLES 3/16") 31G X 5 MM MISC To use daily with basal insulin injections  . metoprolol tartrate (LOPRESSOR) 25 MG tablet Take 1 tablet (25 mg total) by mouth 2 (two) times daily.  . pantoprazole (PROTONIX) 40 MG tablet Take 1 tablet (40 mg total) by mouth daily.  Marland Kitchen senna-docusate (SENOKOT-S) 8.6-50 MG tablet Take 1 tablet by mouth at bedtime as needed for mild constipation.  . sitaGLIPtin (JANUVIA) 25 MG tablet Take 1 tablet (25 mg total) by mouth daily.  . vitamin B-12 (CYANOCOBALAMIN) 1000 MCG tablet Take 1 tablet (1,000 mcg  total) by mouth daily.   No facility-administered encounter medications on file as of 02/04/2020.    Surgical History: Past Surgical History:  Procedure Laterality Date  . CATARACT EXTRACTION    . COLONOSCOPY WITH PROPOFOL N/A 07/02/2019   Procedure: COLONOSCOPY WITH PROPOFOL;  Surgeon: Lin Landsman, MD;  Location: [facility] ENDOSCOPY;  Service: Gastroenterology;  Laterality: N/A;  . IR CT HEAD LTD  08/27/2019  . IR PERCUTANEOUS ART THROMBECTOMY/INFUSION INTRACRANIAL INC DIAG ANGIO  08/27/2019      . IR PERCUTANEOUS ART THROMBECTOMY/INFUSION INTRACRANIAL INC DIAG ANGIO  08/27/2019  . PROSTATE CANCER    . PROSTATE SURGERY    . RADIOLOGY WITH ANESTHESIA N/A 08/27/2019   Procedure: IR WITH ANESTHESIA;  Surgeon: Luanne Bras, MD;  Location: [place];  Service: Radiology;  Laterality: N/A;    Medical History: Past Medical History:  Diagnosis Date  . Atrial fibrillation ([place])   . CHF (congestive heart failure) ([place])   . Diabetes ([place])   . Hearing loss   . High blood pressure   . History of bladder problems   . Kidney stones 11/22/2014  . Prostate cancer ([place])   . Prostate cancer ([place])   . Stroke [facility])     Family History: Family History  Problem Relation Age of Onset  . Colon cancer Mother   . Diabetes Other   . High blood pressure Other   . Prostate cancer  Brother   . Diabetes Brother     Social History   Socioeconomic History  . Marital status: Married    Spouse name: Kamoni Gentles  . Number of children: 2  . Years of education: 20  . Highest education level: 12th grade  Occupational History  . Occupation: [employer]  Tobacco Use  . Smoking status: Former Smoker    Years: 16.00    Types: Cigarettes  . Smokeless tobacco: Never Used  Vaping Use  . Vaping Use: Never used  Substance and Sexual Activity  . Alcohol use: No  . Drug use: No  . Sexual activity: Not Currently  Other Topics Concern  . Not on file  Social History Narrative  . Not on file   Social  Determinants of Health   Financial Resource Strain: Low Risk   . Difficulty of Paying Living Expenses: Not very hard  Food Insecurity: No Food Insecurity  . Worried About Charity fundraiser in the Last Year: Never true  . Ran Out of Food in the Last Year: Never true  Transportation Needs: No Transportation Needs  . Lack of Transportation (Medical): No  . Lack of Transportation (Non-Medical): No  Physical Activity: Inactive  . Days of Exercise per Week: 0 days  . Minutes of Exercise per Session: 0 min  Stress: No Stress Concern Present  . Feeling of Stress : Only a little  Social Connections: Moderately Integrated  . Frequency of Communication with Friends and Family: More than three times a week  . Frequency of Social Gatherings with Friends and Family: More than three times a week  . Attends Religious Services: More than 4 times per year  . Active Member of Clubs or Organizations: No  . Attends Archivist Meetings: Never  . Marital Status: Married  Human resources officer Violence: Not At Risk  . Fear of Current or Ex-Partner: No  . Emotionally Abused: No  . Physically Abused: No  . Sexually Abused: No      Review of Systems  Constitutional: Negative for activity change, chills, fatigue and unexpected weight change.  HENT: Negative for congestion, postnasal drip, rhinorrhea, sneezing and sore throat.   Respiratory: Negative for cough, chest tightness, shortness of breath and wheezing.   Cardiovascular: Negative for chest pain, palpitations and leg swelling.  Gastrointestinal: Negative for abdominal pain, constipation, diarrhea, nausea and vomiting.       Patient having frequent loose stools.   Endocrine: Negative for cold intolerance, heat intolerance, polydipsia and polyuria.       Blood sugars much higher in past few months.   Musculoskeletal: Negative for arthralgias, back pain, joint swelling and neck pain.  Skin: Negative for rash.  Neurological: Negative for  dizziness, tremors, numbness and headaches.  Hematological: Negative for adenopathy. Does not bruise/bleed easily.  Psychiatric/Behavioral: Negative for behavioral problems (Depression), sleep disturbance and suicidal ideas. The patient is not nervous/anxious.     Today's Vitals   02/04/20 0902  BP: 138/83  Pulse: 99  Resp: 16  Temp: (!) 97.5 F (36.4 C)  SpO2: 96%  Weight: 253 lb 6.4 oz (114.9 kg)  Height: 6' (1.829 m)   Body mass index is 34.37 kg/m.    Physical Exam Vitals and nursing note reviewed.  Constitutional:      General: He is not in acute distress.    Appearance: Normal appearance. He is well-developed and well-nourished. He is not diaphoretic.  HENT:     Head: Normocephalic and atraumatic.  Mouth/Throat:     Mouth: Oropharynx is clear and moist.     Pharynx: No oropharyngeal exudate.  Eyes:     Extraocular Movements: EOM normal.     Pupils: Pupils are equal, round, and reactive to light.  Neck:     Thyroid: No thyromegaly.     Vascular: No carotid bruit or JVD.     Trachea: No tracheal deviation.  Cardiovascular:     Rate and Rhythm: Normal rate. Rhythm irregular.     Heart sounds: Normal heart sounds. No murmur heard. No friction rub. No gallop.   Pulmonary:     Effort: Pulmonary effort is normal. No respiratory distress.     Breath sounds: Normal breath sounds. No wheezing or rales.  Chest:     Chest wall: No tenderness.  Abdominal:     General: Bowel sounds are normal.     Palpations: Abdomen is soft.  Musculoskeletal:        General: Normal range of motion.     Cervical back: Normal range of motion and neck supple.  Lymphadenopathy:     Cervical: No cervical adenopathy.  Skin:    General: Skin is warm and dry.  Neurological:     Mental Status: He is alert and oriented to person, place, and time.     Cranial Nerves: No cranial nerve deficit.  Psychiatric:        Mood and Affect: Mood and affect normal.        Behavior: Behavior normal.         Thought Content: Thought content normal.        Judgment: Judgment normal.    Assessment/Plan:  1. Type 2 diabetes mellitus with stage 3b chronic kidney disease, unspecified whether long term insulin use (HCC) - POCT HgB A1C 7.7 today, up from 6.4 at the most recent check. Will start basal insulin at 10 units. Demonstrated proper use of insulin pen to patient and his family member. Patient was given initial dose of 10 units in the office. Both patient and family member voiced understanding of how to properly use pen. Sample [place] and pen needles were given to the patient today. Advised patient to monitor blood sugars closely. Keep log and bring to next visit. contiue [place] 25mg  daily. Will wean down this medication as therapeutic dose of basal insulin reached.  - insulin glargine, 2 Unit Dial, (TOUJEO MAX SOLOSTAR) 300 UNIT/ML Solostar Pen; Inject 10 Units into the skin daily.  Dispense: 3 mL; Refill: 3 - Insulin Pen Needle (PEN NEEDLES 3/16") 31G X 5 MM MISC; To use daily with basal insulin injections  Dispense: 50 each; Refill: 3  2. Essential hypertension Stable. Continue bp medication as prescribed   3. Atrial fibrillation with RVR ([place]) Continue regular visits with cardiology as scheduled.   4. Prostate cancer ([place]) conitnue regular visits with urology/oncology as scheduled.    General Counseling: siddarth hsiung understanding of the findings of todays visit and agrees with plan of treatment. I have discussed any further diagnostic evaluation that may be needed or ordered today. We also reviewed his medications today. he has been encouraged to call the office with any questions or concerns that should arise related to todays visit.  Diabetes Counseling:  1. Addition of ACE inh/ ARB'S for nephroprotection. Microalbumin is updated  2. Diabetic foot care, prevention of complications. Podiatry consult 3. Exercise and lose weight.  4. Diabetic eye examination, Diabetic eye exam  is updated  5. Monitor blood sugar closlely. nutrition  counseling.  6. Sign and symptoms of hypoglycemia including shaking sweating,confusion and headaches.  This patient was seen by Leretha Pol FNP Collaboration with Dr Lavera Guise as a part of collaborative care agreement  Orders Placed This Encounter  Procedures  . POCT HgB A1C    Meds ordered this encounter  Medications  . insulin glargine, 2 Unit Dial, (TOUJEO MAX SOLOSTAR) 300 UNIT/ML Solostar Pen    Sig: Inject 10 Units into the skin daily.    Dispense:  3 mL    Refill:  3    Please substitute with basal insulin preferred by his insurance.    Order Specific Question:   Supervising Provider    Answer:   Lavera Guise [[id number]]  . Insulin Pen Needle (PEN NEEDLES 3/16") 31G X 5 MM MISC    Sig: To use daily with basal insulin injections    Dispense:  50 each    Refill:  3    Please fill with brand preferred by his insurance    Order Specific Question:   Supervising Provider    Answer:   Lavera Guise [[id number]]    Total time spent: 46 Minutes   Time spent includes review of chart, medications, test results, and follow up plan with the patient.      Dr Lavera Guise Internal medicine

## 2020-02-17 ENCOUNTER — Emergency Department: Payer: Medicare Other

## 2020-02-17 ENCOUNTER — Other Ambulatory Visit: Payer: Self-pay

## 2020-02-17 ENCOUNTER — Emergency Department
Admission: EM | Admit: 2020-02-17 | Discharge: 2020-02-17 | Disposition: A | Discharge status: Home / Self Care | Payer: Medicare Other | Attending: Student in an Organized Health Care Education/Training Program | Admitting: Student in an Organized Health Care Education/Training Program

## 2020-02-17 ENCOUNTER — Encounter: Payer: Self-pay | Admitting: Emergency Medicine

## 2020-02-17 DIAGNOSIS — Z743 Need for continuous supervision: Secondary | ICD-10-CM | POA: Diagnosis not present

## 2020-02-17 DIAGNOSIS — I4891 Unspecified atrial fibrillation: Secondary | ICD-10-CM | POA: Diagnosis not present

## 2020-02-17 DIAGNOSIS — Z79899 Other long term (current) drug therapy: Secondary | ICD-10-CM | POA: Insufficient documentation

## 2020-02-17 DIAGNOSIS — Z20822 Contact with and (suspected) exposure to covid-19: Secondary | ICD-10-CM | POA: Diagnosis not present

## 2020-02-17 DIAGNOSIS — I130 Hypertensive heart and chronic kidney disease with heart failure and stage 1 through stage 4 chronic kidney disease, or unspecified chronic kidney disease: Secondary | ICD-10-CM | POA: Diagnosis not present

## 2020-02-17 DIAGNOSIS — Z8546 Personal history of malignant neoplasm of prostate: Secondary | ICD-10-CM | POA: Diagnosis not present

## 2020-02-17 DIAGNOSIS — E1122 Type 2 diabetes mellitus with diabetic chronic kidney disease: Secondary | ICD-10-CM | POA: Insufficient documentation

## 2020-02-17 DIAGNOSIS — Z87891 Personal history of nicotine dependence: Secondary | ICD-10-CM | POA: Insufficient documentation

## 2020-02-17 DIAGNOSIS — R079 Chest pain, unspecified: Secondary | ICD-10-CM | POA: Diagnosis not present

## 2020-02-17 DIAGNOSIS — Z794 Long term (current) use of insulin: Secondary | ICD-10-CM | POA: Insufficient documentation

## 2020-02-17 DIAGNOSIS — R0789 Other chest pain: Secondary | ICD-10-CM | POA: Diagnosis not present

## 2020-02-17 DIAGNOSIS — J449 Chronic obstructive pulmonary disease, unspecified: Secondary | ICD-10-CM | POA: Diagnosis not present

## 2020-02-17 DIAGNOSIS — Z7901 Long term (current) use of anticoagulants: Secondary | ICD-10-CM | POA: Insufficient documentation

## 2020-02-17 DIAGNOSIS — N1832 Chronic kidney disease, stage 3b: Secondary | ICD-10-CM | POA: Diagnosis not present

## 2020-02-17 DIAGNOSIS — I517 Cardiomegaly: Secondary | ICD-10-CM | POA: Diagnosis not present

## 2020-02-17 DIAGNOSIS — I5022 Chronic systolic (congestive) heart failure: Secondary | ICD-10-CM | POA: Diagnosis not present

## 2020-02-17 LAB — CBC WITH DIFFERENTIAL/PLATELET
Abs Immature Granulocytes: 0.01 10*3/uL (ref 0.00–0.07)
Basophils Absolute: 0 10*3/uL (ref 0.0–0.1)
Basophils Relative: 0 %
Eosinophils Absolute: 0.2 10*3/uL (ref 0.0–0.5)
Eosinophils Relative: 3 %
HCT: 41.4 % (ref 39.0–52.0)
Hemoglobin: 13.2 g/dL (ref 13.0–17.0)
Immature Granulocytes: 0 %
Lymphocytes Relative: 37 %
Lymphs Abs: 1.8 10*3/uL (ref 0.7–4.0)
MCH: 29.1 pg (ref 26.0–34.0)
MCHC: 31.9 g/dL (ref 30.0–36.0)
MCV: 91.4 fL (ref 80.0–100.0)
Monocytes Absolute: 0.7 10*3/uL (ref 0.1–1.0)
Monocytes Relative: 16 %
Neutro Abs: 2.1 10*3/uL (ref 1.7–7.7)
Neutrophils Relative %: 44 %
Platelets: 161 10*3/uL (ref 150–400)
RBC: 4.53 MIL/uL (ref 4.22–5.81)
RDW: 16 % — ABNORMAL HIGH (ref 11.5–15.5)
WBC: 4.7 10*3/uL (ref 4.0–10.5)
nRBC: 0 % (ref 0.0–0.2)

## 2020-02-17 LAB — COMPREHENSIVE METABOLIC PANEL
ALT: 19 U/L (ref 0–44)
AST: 21 U/L (ref 15–41)
Albumin: 3.6 g/dL (ref 3.5–5.0)
Alkaline Phosphatase: 94 U/L (ref 38–126)
Anion gap: 8 (ref 5–15)
BUN: 30 mg/dL — ABNORMAL HIGH (ref 8–23)
CO2: 27 mmol/L (ref 22–32)
Calcium: 9.2 mg/dL (ref 8.9–10.3)
Chloride: 106 mmol/L (ref 98–111)
Creatinine, Ser: 1.86 mg/dL — ABNORMAL HIGH (ref 0.61–1.24)
GFR, Estimated: 35 mL/min — ABNORMAL LOW (ref >60)
Glucose, Bld: 148 mg/dL — ABNORMAL HIGH (ref 70–99)
Potassium: 4 mmol/L (ref 3.5–5.1)
Sodium: 141 mmol/L (ref 135–145)
Total Bilirubin: 0.7 mg/dL (ref 0.3–1.2)
Total Protein: 7.5 g/dL (ref 6.5–8.1)

## 2020-02-17 LAB — TROPONIN I (HIGH SENSITIVITY)
Troponin I (High Sensitivity): 20 ng/L — ABNORMAL HIGH (ref <18)
Troponin I (High Sensitivity): 19 ng/L — ABNORMAL HIGH (ref <18)

## 2020-02-17 LAB — POC SARS CORONAVIRUS 2 AG -  ED: SARS Coronavirus 2 Ag: NEGATIVE

## 2020-02-17 LAB — BRAIN NATRIURETIC PEPTIDE: B Natriuretic Peptide: 267.8 pg/mL — ABNORMAL HIGH (ref 0.0–100.0)

## 2020-02-17 MED ORDER — TRAMADOL HCL 50 MG PO TABS
50.0000 mg | ORAL_TABLET | Freq: Once | ORAL | Status: AC
  Administered 2020-02-17: 50 mg via ORAL
  Filled 2020-02-17: qty 1

## 2020-02-17 NOTE — ED Provider Notes (Signed)
[facility] Emergency Department Provider Note    Event Date/Time   First MD Initiated Contact with Patient 02/17/20 [phone]     (approximate)  I have reviewed the triage vital signs and the nursing notes.   HISTORY  Chief Complaint Chest Pain    HPI [name] is a 87 y.o. male below listed past medical history presents to the ER for evaluation of anterior nonradiating chest discomfort that is intermittent.  Worsened with movement as well as with cough.  No congestion.  No lower extremity swelling.  Does have a history of A. fib as well as CHF.  He is on anticoagulation and has been compliant with these medications.    Past Medical History:  Diagnosis Date  . Atrial fibrillation ([place])   . CHF (congestive heart failure) ([place])   . Diabetes ([place])   . Hearing loss   . High blood pressure   . History of bladder problems   . Kidney stones 11/22/2014  . Prostate cancer ([place])   . Prostate cancer ([place])   . Stroke [facility])    Family History  Problem Relation Age of Onset  . Colon cancer Mother   . Diabetes Other   . High blood pressure Other   . Prostate cancer Brother   . Diabetes Brother    Past Surgical History:  Procedure Laterality Date  . CATARACT EXTRACTION    . COLONOSCOPY WITH PROPOFOL N/A 07/02/2019   Procedure: COLONOSCOPY WITH PROPOFOL;  Surgeon: Lin Landsman, MD;  Location: [facility] ENDOSCOPY;  Service: Gastroenterology;  Laterality: N/A;  . IR CT HEAD LTD  08/27/2019  . IR PERCUTANEOUS ART THROMBECTOMY/INFUSION INTRACRANIAL INC DIAG ANGIO  08/27/2019      . IR PERCUTANEOUS ART THROMBECTOMY/INFUSION INTRACRANIAL INC DIAG ANGIO  08/27/2019  . PROSTATE CANCER    . PROSTATE SURGERY    . RADIOLOGY WITH ANESTHESIA N/A 08/27/2019   Procedure: IR WITH ANESTHESIA;  Surgeon: Luanne Bras, MD;  Location: [place];  Service: Radiology;  Laterality: N/A;   Patient Active Problem List   Diagnosis Date Noted  . Other specified disorders of kidney  and ureter 01/02/2020  . Abdominal distension 10/31/2019  . Atherosclerosis of aorta ([place]) 10/15/2019  . Androgen deprivation therapy 10/13/2019  . Other constipation 08/29/2019  . Acute ischemic left MCA stroke ([place]) 08/27/2019  . Normocytic anemia 08/05/2019  . Prostate cancer ([place]) 08/05/2019  . Goals of care, counseling/discussion 07/22/2019  . Fecal impaction ([place]) 07/11/2019  . Lower GI bleeding 06/30/2019  . Diverticulosis of colon 02/07/2019  . Chronic obstructive pulmonary disease ([place])   . Chronic a-fib ([place])   . Type 2 diabetes mellitus with hyperglycemia ([place]) 10/23/2018  . Secondary hypercoagulability disorder ([place]) 10/23/2018  . Acute or subacute form of ischemic heart disease ([place]) 10/23/2018  . Need for vaccination against Streptococcus pneumoniae using pneumococcal conjugate vaccine 13 10/23/2018  . Chronic mesenteric ischemia ([place]) 07/28/2018  . Abdominal aortic aneurysm without rupture ([place]) 07/28/2018  . Primary osteoarthritis of left hip 02/12/2018  . Bilateral hearing loss due to cerumen impaction 10/06/2017  . Chronic systolic heart failure ([place]) 08/19/2017  . Atrial fibrillation with RVR ([place]) 08/02/2017  . Acute on chronic congestive heart failure ([place]) 08/02/2017  . Prostatic hypertrophy 07/20/2017  . Dysuria 07/20/2017  . Hyperpigmentation 04/10/2017  . Uncontrolled type 2 diabetes mellitus with hyperglycemia ([place]) 03/24/2017  . Encounter for general adult medical examination with abnormal findings 03/24/2017  . Chest pain 05/04/2016  . Elevated troponin  05/04/2016  . Essential hypertension 07/06/2014  . Cough 03/21/2014  . OSA (obstructive sleep apnea) 03/21/2014  . Paroxysmal A-fib ([place]) 03/21/2014  . Hx of adenomatous colonic polyps 06/28/2013  . Liver cyst 06/28/2013  . Chronic kidney disease 06/08/2013  . Type 2 diabetes mellitus with stage 3b chronic kidney disease, without long-term current use of insulin ([place]) 06/08/2013  . Hyperlipidemia,  mixed 06/08/2013  . Valvular heart disease 06/08/2013      Prior to Admission medications   Medication Sig Start Date End Date Taking? Authorizing Provider  albuterol (VENTOLIN HFA) 108 (90 Base) MCG/ACT inhaler Inhale 2 puffs into the lungs every 4 (four) hours as needed for wheezing or shortness of breath. 06/28/19   Ronnell Freshwater, NP  apixaban (ELIQUIS) 2.5 MG TABS tablet Take 2.5 mg by mouth 2 (two) times daily.    [provider]  atorvastatin (LIPITOR) 10 MG tablet Take 1 tablet (10 mg total) by mouth daily. 10/04/19   Ronnell Freshwater, NP  ferrous sulfate 325 (65 FE) MG tablet Take 325 mg by mouth daily.     [provider]  glucose blood (ONETOUCH VERIO) test strip USE ONCE DAILY. DX E11.65. 03/15/19   Ronnell Freshwater, NP  insulin glargine, 2 Unit Dial, (TOUJEO MAX SOLOSTAR) 300 UNIT/ML Solostar Pen Inject 10 Units into the skin daily. 02/04/20   Ronnell Freshwater, NP  Insulin Pen Needle (PEN NEEDLES 3/16") 31G X 5 MM MISC To use daily with basal insulin injections 02/04/20   Ronnell Freshwater, NP  metoprolol tartrate (LOPRESSOR) 25 MG tablet Take 1 tablet (25 mg total) by mouth 2 (two) times daily. 08/31/19   Vonzella Nipple, NP  pantoprazole (PROTONIX) 40 MG tablet Take 1 tablet (40 mg total) by mouth daily. 10/04/19   Ronnell Freshwater, NP  senna-docusate (SENOKOT-S) 8.6-50 MG tablet Take 1 tablet by mouth at bedtime as needed for mild constipation. 08/31/19   Vonzella Nipple, NP  sitaGLIPtin (JANUVIA) 25 MG tablet Take 1 tablet (25 mg total) by mouth daily. 11/19/19   Ronnell Freshwater, NP  vitamin B-12 (CYANOCOBALAMIN) 1000 MCG tablet Take 1 tablet (1,000 mcg total) by mouth daily. 01/10/20   Earlie Server, MD    Allergies Patient has no known allergies.    Social History Social History   Tobacco Use  . Smoking status: Former Smoker    Years: 16.00    Types: Cigarettes  . Smokeless tobacco: Never Used  Vaping Use  . Vaping Use: Never used   Substance Use Topics  . Alcohol use: No  . Drug use: No    Review of Systems Patient denies headaches, rhinorrhea, blurry vision, numbness, shortness of breath, chest pain, edema, cough, abdominal pain, nausea, vomiting, diarrhea, dysuria, fevers, rashes or hallucinations unless otherwise stated above in HPI. ____________________________________________   PHYSICAL EXAM:  VITAL SIGNS: Vitals:   02/17/20 0903 02/17/20 1000  BP: (!) 178/90 (!) 164/88  Pulse: 65 83  Resp: 18 16  Temp:    SpO2: 99% 100%    Constitutional: Alert and oriented.  Eyes: Conjunctivae are normal.  Head: Atraumatic. Nose: No congestion/rhinnorhea. Mouth/Throat: Mucous membranes are moist.   Neck: No stridor. Painless ROM.  Cardiovascular: Normal rate, regular rhythm. Grossly normal heart sounds.  Good peripheral circulation. Respiratory: Normal respiratory effort.  No retractions. Lungs CTAB. Gastrointestinal: Soft and nontender. No distention. No abdominal bruits. No CVA tenderness. Genitourinary:  Musculoskeletal: there is mild ttp of anterior chest wall, no crepitus or  contusion noted. No lower extremity tenderness nor edema.  No joint effusions. Neurologic:  Normal speech and language. No gross focal neurologic deficits are appreciated. No facial droop Skin:  Skin is warm, dry and intact. No rash noted. Psychiatric: Mood and affect are normal. Speech and behavior are normal.  ____________________________________________   LABS (all labs ordered are listed, but only abnormal results are displayed)  Results for orders placed or performed during the hospital encounter of 02/17/20 (from the past 24 hour(s))  CBC with Differential     Status: Abnormal   Collection Time: 02/17/20  5:29 AM  Result Value Ref Range   WBC 4.7 4.0 - 10.5 K/uL   RBC 4.53 4.22 - 5.81 MIL/uL   Hemoglobin 13.2 13.0 - 17.0 g/dL   HCT 41.4 39.0 - 52.0 %   MCV 91.4 80.0 - 100.0 fL   MCH 29.1 26.0 - 34.0 pg   MCHC 31.9 30.0  - 36.0 g/dL   RDW 16.0 (H) 11.5 - 15.5 %   Platelets 161 150 - 400 K/uL   nRBC 0.0 0.0 - 0.2 %   Neutrophils Relative % 44 %   Neutro Abs 2.1 1.7 - 7.7 K/uL   Lymphocytes Relative 37 %   Lymphs Abs 1.8 0.7 - 4.0 K/uL   Monocytes Relative 16 %   Monocytes Absolute 0.7 0.1 - 1.0 K/uL   Eosinophils Relative 3 %   Eosinophils Absolute 0.2 0.0 - 0.5 K/uL   Basophils Relative 0 %   Basophils Absolute 0.0 0.0 - 0.1 K/uL   Immature Granulocytes 0 %   Abs Immature Granulocytes 0.01 0.00 - 0.07 K/uL  Comprehensive metabolic panel     Status: Abnormal   Collection Time: 02/17/20  5:29 AM  Result Value Ref Range   Sodium 141 135 - 145 mmol/L   Potassium 4.0 3.5 - 5.1 mmol/L   Chloride 106 98 - 111 mmol/L   CO2 27 22 - 32 mmol/L   Glucose, Bld 148 (H) 70 - 99 mg/dL   BUN 30 (H) 8 - 23 mg/dL   Creatinine, Ser 1.86 (H) 0.61 - 1.24 mg/dL   Calcium 9.2 8.9 - 10.3 mg/dL   Total Protein 7.5 6.5 - 8.1 g/dL   Albumin 3.6 3.5 - 5.0 g/dL   AST 21 15 - 41 U/L   ALT 19 0 - 44 U/L   Alkaline Phosphatase 94 38 - 126 U/L   Total Bilirubin 0.7 0.3 - 1.2 mg/dL   GFR, Estimated 35 (L) >60 mL/min   Anion gap 8 5 - 15  Troponin I (High Sensitivity)     Status: Abnormal   Collection Time: 02/17/20  5:29 AM  Result Value Ref Range   Troponin I (High Sensitivity) 20 (H) <18 ng/L  Brain natriuretic peptide     Status: Abnormal   Collection Time: 02/17/20  5:30 AM  Result Value Ref Range   B Natriuretic Peptide 267.8 (H) 0.0 - 100.0 pg/mL  Troponin I (High Sensitivity)     Status: Abnormal   Collection Time: 02/17/20  7:58 AM  Result Value Ref Range   Troponin I (High Sensitivity) 19 (H) <18 ng/L  POC SARS Coronavirus 2 Ag-ED - Nasal Swab     Status: None   Collection Time: 02/17/20  9:55 AM  Result Value Ref Range   SARS Coronavirus 2 Ag NEGATIVE NEGATIVE   ____________________________________________  EKG My review and personal interpretation at Time: 5:24   Indication: chest pain  Rate: 60  Rhythm: afib Axis: left Other:  ____________________________________________  RADIOLOGY  I personally reviewed all radiographic images ordered to evaluate for the above acute complaints and reviewed radiology reports and findings.  These findings were personally discussed with the patient.  Please see medical record for radiology report.  ____________________________________________   PROCEDURES  Procedure(s) performed:  Procedures    Critical Care performed: no ____________________________________________   INITIAL IMPRESSION / ASSESSMENT AND PLAN / ED COURSE  Pertinent labs & imaging results that were available during my care of the patient were reviewed by me and considered in my medical decision making (see chart for details).   DDX: ACS, pericarditis, esophagitis, boerhaaves, pe, dissection, pna, bronchitis, costochondritis   [name] is a 87 y.o. who presents to the ED with presentation as described above.  Patient nontoxic-appearing afebrile hemodynamically stable.  Mildly hypertensive but no acute distress.  EKG nonischemic.  Serial enzymes are stable.  Not consistent with ACS.  Describing reproducible chest pain with movement suspect possible pleurisy versus musculoskeletal strain as he states his symptoms did start after he bent over.  No report of any trauma.  No infiltrates.  Do not appreciate any wheezing no history of COPD or bronchitis.  He is on anticoagulation therefore is not consistent with PE.  Not consistent with dissection.  His abdominal exam is soft and benign.  Clinical Course as of 02/17/20 1009  Thu Feb 17, 2020  1009 Serial enzymes are stable.  He is currently chest pain-free.  States that he was lifting items that were heavy in his closet this morning and he knows that he is not supposed to do that and thinks that he pulled a muscle.  As he is pain-free right now with no EKG changes symptoms would be very atypical for ACS with reassuring work-up  I think is appropriate for outpatient follow-up.  Patient agreeable to plan. [PR]    Clinical Course User Index [PR] Merlyn Lot, MD    The patient was evaluated in Emergency Department today for the symptoms described in the history of present illness. He/she was evaluated in the context of the global COVID-19 pandemic, which necessitated consideration that the patient might be at risk for infection with the SARS-CoV-2 virus that causes COVID-19. Institutional protocols and algorithms that pertain to the evaluation of patients at risk for COVID-19 are in a state of rapid change based on information released by regulatory bodies including the CDC and federal and state organizations. These policies and algorithms were followed during the patient's care in the ED.  As part of my medical decision making, I reviewed the following data within the [place] notes reviewed and incorporated, Labs reviewed, notes from prior ED visits and [place] Controlled Substance Database   ____________________________________________   FINAL CLINICAL IMPRESSION(S) / ED DIAGNOSES  Final diagnoses:  Atypical chest pain      NEW MEDICATIONS STARTED DURING THIS VISIT:  New Prescriptions   No medications on file     Note:  This document was prepared using Dragon voice recognition software and may include unintentional dictation errors.    Merlyn Lot, MD 02/17/20 1009

## 2020-02-17 NOTE — ED Triage Notes (Addendum)
EMS brings pt in from home for c/o intermittent mid CP, nonradiating with no accomp symptoms; denies any recent illness, st pain is only intermittent when taking deep breath or moving; hx CHF, afib

## 2020-02-20 DIAGNOSIS — G4733 Obstructive sleep apnea (adult) (pediatric): Secondary | ICD-10-CM | POA: Diagnosis not present

## 2020-02-23 ENCOUNTER — Other Ambulatory Visit: Payer: Self-pay | Admitting: *Deleted

## 2020-02-23 NOTE — Patient Instructions (Signed)
Insulin Glargine injection What is this medicine? INSULIN GLARGINE (IN su lin GLAR geen) is a human-made form of insulin. This drug lowers the amount of sugar in your blood. It is a long-acting insulin that is usually given once a day. This medicine may be used for other purposes; ask your health care provider or pharmacist if you have questions. COMMON BRAND NAME(S): BASAGLAR, Lantus, Lantus SoloStar, Semglee, Toujeo Max SoloStar, [employer] What should I tell my health care provider before I take this medicine? They need to know if you have any of these conditions:  episodes of low blood sugar  eye disease, vision problems  kidney disease  liver disease  an unusual or allergic reaction to insulin, metacresol, other medicines, foods, dyes, or preservatives  pregnant or trying to get pregnant  breast-feeding How should I use this medicine? This medicine is for injection under the skin. Use this medicine at the same time each day. Use exactly as directed. This insulin should never be mixed in the same syringe with other insulins before injection. Do not vigorously shake before use. You will be taught how to use this medicine and how to adjust doses for activities and illness. Do not use more insulin than prescribed. Always check the appearance of your insulin before using it. This medicine should be clear and colorless like water. Do not use it if it is cloudy, thickened, colored, or has solid particles in it. If you use an insulin pen, be sure to take off the outer needle cover before using the dose. It is important that you put your used needles and syringes in a special sharps container. Do not put them in a trash can. If you do not have a sharps container, call your pharmacist or healthcare provider to get one. This drug comes with INSTRUCTIONS FOR USE. Ask your pharmacist for directions on how to use this drug. Read the information carefully. Talk to your pharmacist or health care  provider if you have questions. Talk to your pediatrician regarding the use of this medicine in children. While this drug may be prescribed for children as young as 6 years for selected conditions, precautions do apply. Overdosage: If you think you have taken too much of this medicine contact a poison control center or emergency room at once. NOTE: This medicine is only for you. Do not share this medicine with others. What if I miss a dose? It is important not to miss a dose. Your health care professional or doctor should discuss a plan for missed doses with you. If you do miss a dose, follow their plan. Do not take double doses. What may interact with this medicine?  other medicines for diabetes Many medications may cause changes in blood sugar, these include:  alcohol containing beverages  antiviral medicines for HIV or AIDS  aspirin and aspirin-like drugs  certain medicines for blood pressure, heart disease, irregular heart beat  chromium  diuretics  male hormones, such as estrogens or progestins, birth control pills  fenofibrate  gemfibrozil  isoniazid  lanreotide  male hormones or anabolic steroids  MAOIs like Carbex, Eldepryl, Marplan, Nardil, and Parnate  medicines for weight loss  medicines for allergies, asthma, cold, or cough  medicines for depression, anxiety, or psychotic disturbances  niacin  nicotine  NSAIDs, medicines for pain and inflammation, like ibuprofen or naproxen  octreotide  pasireotide  pentamidine  phenytoin  probenecid  quinolone antibiotics such as ciprofloxacin, levofloxacin, ofloxacin  some herbal dietary supplements  steroid medicines  such as prednisone or cortisone  sulfamethoxazole; trimethoprim  thyroid hormones Some medications can hide the warning symptoms of low blood sugar (hypoglycemia). You may need to monitor your blood sugar more closely if you are taking one of these medications. These  include:  beta-blockers, often used for high blood pressure or heart problems (examples include atenolol, metoprolol, propranolol)  clonidine  guanethidine  reserpine This list may not describe all possible interactions. Give your health care provider a list of all the medicines, herbs, non-prescription drugs, or dietary supplements you use. Also tell them if you smoke, drink alcohol, or use illegal drugs. Some items may interact with your medicine. What should I watch for while using this medicine? Visit your health care professional or doctor for regular checks on your progress. Do not drive, use machinery, or do anything that needs mental alertness until you know how this medicine affects you. Alcohol may interfere with the effect of this medicine. Avoid alcoholic drinks. A test called the HbA1C (A1C) will be monitored. This is a simple blood test. It measures your blood sugar control over the last 2 to 3 months. You will receive this test every 3 to 6 months. Learn how to check your blood sugar. Learn the symptoms of low and high blood sugar and how to manage them. Always carry a quick-source of sugar with you in case you have symptoms of low blood sugar. Examples include hard sugar candy or glucose tablets. Make sure others know that you can choke if you eat or drink when you develop serious symptoms of low blood sugar, such as seizures or unconsciousness. They must get medical help at once. Tell your doctor or health care professional if you have high blood sugar. You might need to change the dose of your medicine. If you are sick or exercising more than usual, you might need to change the dose of your medicine. Do not skip meals. Ask your doctor or health care professional if you should avoid alcohol. Many nonprescription cough and cold products contain sugar or alcohol. These can affect blood sugar. Make sure that you have the right kind of syringe for the type of insulin you use. Try not  to change the brand and type of insulin or syringe unless your health care professional or doctor tells you to. Switching insulin brand or type can cause dangerously high or low blood sugar. Always keep an extra supply of insulin, syringes, and needles on hand. Use a syringe one time only. Throw away syringe and needle in a closed container to prevent accidental needle sticks. Insulin pens and cartridges should never be shared. Even if the needle is changed, sharing may result in passing of viruses like hepatitis or HIV. Each time you get a new box of pen needles, check to see if they are the same type as the ones you were trained to use. If not, ask your health care professional to show you how to use this new type properly. Wear a medical ID bracelet or chain, and carry a card that describes your disease and details of your medicine and dosage times. What side effects may I notice from receiving this medicine? Side effects that you should report to your doctor or health care professional as soon as possible:  allergic reactions like skin rash, itching or hives, swelling of the face, lips, or tongue  breathing problems  signs and symptoms of high blood sugar such as dizziness, dry mouth, dry skin, fruity breath, nausea, stomach pain,  increased hunger or thirst, increased urination  signs and symptoms of low blood sugar such as feeling anxious, confusion, dizziness, increased hunger, unusually weak or tired, sweating, shakiness, cold, irritable, headache, blurred vision, fast heartbeat, loss of consciousness Side effects that usually do not require medical attention (report to your doctor or health care professional if they continue or are bothersome):  increase or decrease in fatty tissue under the skin due to overuse of a particular injection site  itching, burning, swelling, or rash at site where injected This list may not describe all possible side effects. Call your doctor for medical advice  about side effects. You may report side effects to FDA at 1-800-FDA-1088. Where should I keep my medicine? Keep out of the reach of children. Unopened Vials: Lantus vials: Store in a refrigerator between 2 and 8 degrees C (36 and 46 degrees F) or at room temperature below 30 degrees C (86 degrees F). Do not freeze or use if the insulin has been frozen. Protect from light and excessive heat. If stored at room temperature, the vial must be discarded after 28 days. Throw away any unopened and unused medicine that has been stored in the refrigerator after the expiration date. Unopened Pens: Neurosurgeon: Store in a refrigerator between 2 and 8 degrees C (36 and 46 degrees F) or at room temperature below 30 degrees C (86 degrees F). Do not freeze or use if the insulin has been frozen. Protect from light and excessive heat. If stored at room temperature, the pen must be discarded after 28 days. Throw away any unopened and unused medicine that has been stored in the refrigerator after the expiration date. Lantus Solostar Pens: Store in a refrigerator between 2 and 8 degrees C (36 and 46 degrees F) or at room temperature below 30 degrees C (86 degrees F). Do not freeze or use if the insulin has been frozen. Protect from light and excessive heat. If stored at room temperature, the pen must be discarded after 28 days. Throw away any unopened and unused medicine that has been stored in the refrigerator after the expiration date. Semglee Pens: Store in a refrigerator between 2 and 8 degrees C (36 and 46 degrees F) or at room temperature below 30 degrees C (86 degrees F). Do not freeze or use if the insulin has been frozen. Protect from light and excessive heat. If stored at room temperature, the pen must be discarded after 28 days. Throw away any unopened and unused medicine that has been stored in the refrigerator after the expiration date. Toujeo Solostar Pens or Toujeo Max [employer] Pens: Store in a refrigerator  between 2 and 8 degrees C (36 and 46 degrees F). Do not freeze or use if the insulin has been frozen. Protect from light and excessive heat. Throw away any unopened and unused medicine that has been stored in the refrigerator after the expiration date. Vials that you are using: Lantus vials: Store in a refrigerator or at room temperature below 30 degrees C (86 degrees F). Do not freeze. Keep away from heat and light. Throw the opened vial away after 28 days. Semglee vials: Store in a refrigerator or at room temperature below 30 degrees C (86 degrees F). Do not freeze. Keep away from heat and light. Throw the opened vial away after 28 days. Pens that you are using: Basaglar KwikPens: Store at room temperature below 30 degrees C (86 degrees F). Do not refrigerate or freeze. Keep away from heat and light.  Throw the pen away after 28 days, even if it still has insulin left in it. Lantus Solostar Pens: Store at room temperature below 30 degrees C (86 degrees F). Do not refrigerate or freeze. Keep away from heat and light. Throw the pen away after 28 days, even if it still has insulin left in it. Semglee Pens: Store at room temperature below 30 degrees C (86 degrees F). Do not refrigerate or freeze. Keep away from heat and light. Throw the pen away after 28 days, even if it still has insulin left in it. Toujeo Solostar Pens or Toujeo Max [employer] Pens: Store at room temperature below 30 degrees C (86 degrees F). Do not refrigerate or freeze. Keep away from heat and light. Throw the pen away after 56 days, even if it still has insulin left in it. NOTE: This sheet is a summary. It may not cover all possible information. If you have questions about this medicine, talk to your doctor, pharmacist, or health care provider.  2021 Elsevier/Gold Standard (2018-10-28 15:00:23)    Goals Addressed            This Visit's Progress   . COMPLETED: [facility] - Keep or Improve My Strength   On track    Follow Up Date  02/08/2020  Timeframe:  Long-Range Goal Priority:  Medium Start Date:        01/12/2020                     Expected End Date:         02/12/2020                - eat healthy to increase strength - increase activity or exercise time a little every week - know who to call for help if I fall - learn how to get up if I fall - plan activity for times when energy is the highest    Why is this important?   Before the stroke you probably did not think much about being safe when you are up and about.  Now, it may be harder for you to get around.  It may also be easier for you to trip or fall.  It is common to have muscle weakness after a stroke. You may also feel like you cannot control an arm or leg.  It will be helpful to work with a physical therapist to get your strength and muscle control back.  It is good to stay as active as you can. Walking and stretching help you stay strong and flexible.  The physical therapist will develop an exercise program just for you.     Notes:   11/24 - discussed increasing activity, increasing hours at work     . [facility] - Monitor and Manage My Blood Sugar-Diabetes Type 2       Timeframe:  Short-Term Goal Priority:  Medium Start Date:          02/23/2020                   Expected End Date:        03/22/2020               Follow Up Date:  03/22/2020   - check blood sugar at prescribed times - check blood sugar if I feel it is too high or too low - enter blood sugar readings and medication or insulin into daily log    Why is this important?  Checking your blood sugar at home helps to keep it from getting very high or very low.   Writing the results in a diary or log helps the doctor know how to care for you.   Your blood sugar log should have the time, date and the results.   Also, write down the amount of insulin or other medicine that you take.   Other information, like what you ate, exercise done and how you were feeling, will also be helpful.      Notes:     . [facility] - Track and Manage Heart Rate and Rhythm   On track    Follow Up Date 03/22/2020  Timeframe:  Long-Range Goal Priority:  High Start Date:    01/12/2020                      Expected End Date:       03/12/2020                  - bring symptom diary to all appointments - check pulse (heart) rate once a day - keep all lab appointments    Why is this important?   Atrial fibrillation may have no symptoms. Sometimes the symptoms get worse or happen more often.  It is important to keep track of what your symptoms are and when they happen.  A change in symptoms is important to discuss with your doctor or nurse.  Being active and healthy eating will also help you manage your heart condition.     Notes:   11/24 - Reviewed upcoming appointment with cardiology, encouraged to monitor HR and BP and take trends for review  12/22 - Will send BP monitor to home for daily monitoring  2/2 - Reminded to recording daily readings, will send calendar with logs

## 2020-02-23 NOTE — Patient Outreach (Addendum)
[facility]) Care Management  02/23/2020  [name] 03/12/1933 [id number]   Outgoing call placed to member, spoke to wife.  Report member is doing well, continues to improve, now driving again short distances.  Seen by PCP on 1/14, noted that A1C increased from 6.4 to 7.7.  Placed on Toujeo for better glucose control, will follow up on 2/18.  Report blood sugars since starting med have have decreased, ranging 100s-130's.  He has also continued to monitor blood pressure, wife unable to find log at this time.  Discussed recording trends all in one place to be able to share with provider for better management.  She verbalizes understanding.   In addition to PCP on 2/18, follow up appointments with cardiology on 2/4 and neurology on 2/24.  She does not feel comfortable with member driving to [facility] for neurology appointment, will call transportation services instead.  Denies any urgent concerns, encouraged to contact this care manager with questions.    Will send information on Toujeo injections.  Will mail [facility] calendar/monitoring logs.  Agrees to follow up within the next month.  Goals Addressed            This Visit's Progress   . COMPLETED: [facility] - Keep or Improve My Strength   On track    Follow Up Date 02/08/2020  Timeframe:  Long-Range Goal Priority:  Medium Start Date:        01/12/2020                     Expected End Date:         02/12/2020                - eat healthy to increase strength - increase activity or exercise time a little every week - know who to call for help if I fall - learn how to get up if I fall - plan activity for times when energy is the highest    Why is this important?   Before the stroke you probably did not think much about being safe when you are up and about.  Now, it may be harder for you to get around.  It may also be easier for you to trip or fall.  It is common to have muscle weakness after a stroke. You may also feel  like you cannot control an arm or leg.  It will be helpful to work with a physical therapist to get your strength and muscle control back.  It is good to stay as active as you can. Walking and stretching help you stay strong and flexible.  The physical therapist will develop an exercise program just for you.     Notes:   11/24 - discussed increasing activity, increasing hours at work     . [facility] - Monitor and Manage My Blood Sugar-Diabetes Type 2       Timeframe:  Short-Term Goal Priority:  Medium Start Date:          02/23/2020                   Expected End Date:        03/22/2020               Follow Up Date:  03/22/2020   - check blood sugar at prescribed times - check blood sugar if I feel it is too high or too low - enter blood sugar readings and medication or  insulin into daily log    Why is this important?    Checking your blood sugar at home helps to keep it from getting very high or very low.   Writing the results in a diary or log helps the doctor know how to care for you.   Your blood sugar log should have the time, date and the results.   Also, write down the amount of insulin or other medicine that you take.   Other information, like what you ate, exercise done and how you were feeling, will also be helpful.     Notes:     . [facility] - Track and Manage Heart Rate and Rhythm   On track    Follow Up Date 03/22/2020  Timeframe:  Long-Range Goal Priority:  High Start Date:    01/12/2020                      Expected End Date:       03/12/2020                  - bring symptom diary to all appointments - check pulse (heart) rate once a day - keep all lab appointments    Why is this important?   Atrial fibrillation may have no symptoms. Sometimes the symptoms get worse or happen more often.  It is important to keep track of what your symptoms are and when they happen.  A change in symptoms is important to discuss with your doctor or nurse.  Being active and healthy eating  will also help you manage your heart condition.     Notes:   11/24 - Reviewed upcoming appointment with cardiology, encouraged to monitor HR and BP and take trends for review  12/22 - Will send BP monitor to home for daily monitoring  2/2 - Reminded to recording daily readings, will send calendar with logs       [name], RN, MSN [place] Manager [phone]

## 2020-02-25 DIAGNOSIS — I208 Other forms of angina pectoris: Secondary | ICD-10-CM | POA: Diagnosis not present

## 2020-02-25 DIAGNOSIS — E782 Mixed hyperlipidemia: Secondary | ICD-10-CM | POA: Diagnosis not present

## 2020-02-25 DIAGNOSIS — I482 Chronic atrial fibrillation, unspecified: Secondary | ICD-10-CM | POA: Diagnosis not present

## 2020-02-25 DIAGNOSIS — I714 Abdominal aortic aneurysm, without rupture: Secondary | ICD-10-CM | POA: Diagnosis not present

## 2020-02-25 DIAGNOSIS — I10 Essential (primary) hypertension: Secondary | ICD-10-CM | POA: Diagnosis not present

## 2020-02-25 DIAGNOSIS — I5032 Chronic diastolic (congestive) heart failure: Secondary | ICD-10-CM | POA: Diagnosis not present

## 2020-02-25 DIAGNOSIS — N1832 Chronic kidney disease, stage 3b: Secondary | ICD-10-CM | POA: Diagnosis not present

## 2020-02-25 DIAGNOSIS — R600 Localized edema: Secondary | ICD-10-CM | POA: Diagnosis not present

## 2020-02-25 DIAGNOSIS — I2089 Other forms of angina pectoris: Secondary | ICD-10-CM | POA: Insufficient documentation

## 2020-03-03 ENCOUNTER — Inpatient Hospital Stay: Payer: Medicare Other | Attending: Oncology

## 2020-03-10 ENCOUNTER — Ambulatory Visit: Payer: Medicare Other | Admitting: Hospice and Palliative Medicine

## 2020-03-10 DIAGNOSIS — I208 Other forms of angina pectoris: Secondary | ICD-10-CM | POA: Diagnosis not present

## 2020-03-13 ENCOUNTER — Telehealth: Payer: Self-pay

## 2020-03-13 NOTE — Telephone Encounter (Signed)
Called to reschedule appt, spoke to wife and she will talk with pt and get back with [place] to get scheduled.

## 2020-03-16 ENCOUNTER — Encounter: Payer: Self-pay | Admitting: Adult Health

## 2020-03-16 ENCOUNTER — Other Ambulatory Visit: Payer: Self-pay

## 2020-03-16 ENCOUNTER — Ambulatory Visit: Payer: Medicare Other | Admitting: Adult Health

## 2020-03-16 VITALS — BP 111/75 | HR 73 | Ht 72.0 in | Wt 250.0 lb

## 2020-03-16 DIAGNOSIS — I639 Cerebral infarction, unspecified: Secondary | ICD-10-CM | POA: Diagnosis not present
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
## 2020-03-16 NOTE — Patient Instructions (Signed)
Continue Eliquis (apixaban) daily  and atorvastatin for secondary stroke prevention  Continue routine follow-up with cardiology as advised  Continue to follow up with PCP regarding cholesterol, blood pressure and diabetes management  Maintain strict control of hypertension with blood pressure goal below 130/90, diabetes with hemoglobin A1c goal below 6.5% and cholesterol with LDL cholesterol (bad cholesterol) goal below 70 mg/dL.       Followup in the future with me in 6 months or call earlier if needed       Thank you for coming to see [place] at [facility]. I hope we have been able to provide you high quality care today.  You may receive a patient satisfaction survey over the next few weeks. We would appreciate your feedback and comments so that we may continue to improve ourselves and the health of our patients.

## 2020-03-17 ENCOUNTER — Other Ambulatory Visit: Payer: Self-pay | Admitting: Nurse Practitioner

## 2020-03-17 DIAGNOSIS — E1122 Type 2 diabetes mellitus with diabetic chronic kidney disease: Secondary | ICD-10-CM

## 2020-03-20 DIAGNOSIS — G4733 Obstructive sleep apnea (adult) (pediatric): Secondary | ICD-10-CM | POA: Diagnosis not present

## 2020-03-21 NOTE — Progress Notes (Signed)
I agree with the above plan 

## 2020-03-22 ENCOUNTER — Other Ambulatory Visit: Payer: Self-pay | Admitting: *Deleted

## 2020-03-22 NOTE — Patient Outreach (Signed)
[facility]) Care Management  [place]  03/22/2020   ELLWYN ERGLE 07-Apr-1933 [id number]   Outgoing call placed to member/wife, she report he still has been doing well.  He continues to work daily with the exception of Fridays despite her encouraging him to take more time off to rest.  He had a stress test a couple weeks ago due to chest pain in January, will follow up with cardiology for results.  He has not had any chest pain or discomfort since.  He was supposed to have PCP appointment on 2/18 but wife report she had to cancel due to the stress test.  Offered to call to reschedule, she state she will do it independently.  Report heart rate, blood pressure, and blood sugar readings daily have all been "good."  Denies any urgent concerns, encouraged to contact this care manager with questions.     Encounter Medications:  Outpatient Encounter Medications as of 03/22/2020  Medication Sig  . albuterol (VENTOLIN HFA) 108 (90 Base) MCG/ACT inhaler Inhale 2 puffs into the lungs every 4 (four) hours as needed for wheezing or shortness of breath.  Marland Kitchen apixaban (ELIQUIS) 2.5 MG TABS tablet Take 2.5 mg by mouth 2 (two) times daily.  Marland Kitchen atorvastatin (LIPITOR) 10 MG tablet Take 1 tablet (10 mg total) by mouth daily.  . ferrous sulfate 325 (65 FE) MG tablet Take 325 mg by mouth daily.   Marland Kitchen glucose blood (ONETOUCH VERIO) test strip USE ONCE DAILY. DX E11.65.  Marland Kitchen insulin glargine, 2 Unit Dial, (TOUJEO MAX SOLOSTAR) 300 UNIT/ML Solostar Pen Inject 10 Units into the skin daily.  . Insulin Pen Needle (PEN NEEDLES 3/16") 31G X 5 MM MISC To use daily with basal insulin injections  . JANUVIA 25 MG tablet TAKE 1 TABLET(25 MG) BY MOUTH DAILY  . metoprolol tartrate (LOPRESSOR) 25 MG tablet Take 1 tablet (25 mg total) by mouth 2 (two) times daily.  . pantoprazole (PROTONIX) 40 MG tablet Take 1 tablet (40 mg total) by mouth daily.  Marland Kitchen senna-docusate (SENOKOT-S) 8.6-50 MG tablet Take 1 tablet by  mouth at bedtime as needed for mild constipation.  . vitamin B-12 (CYANOCOBALAMIN) 1000 MCG tablet Take 1 tablet (1,000 mcg total) by mouth daily.   No facility-administered encounter medications on file as of 03/22/2020.    Functional Status:  In your present state of health, do you have any difficulty performing the following activities: 10/29/2019 10/15/2019  Hearing? N N  Comment - -  Vision? N N  Difficulty concentrating or making decisions? N N  Walking or climbing stairs? N N  Comment - -  Dressing or bathing? N N  Comment - -  Doing errands, shopping? N N  Preparing Food and eating ? N N  Using the Toilet? N N  In the past six months, have you accidently leaked urine? Y Y  Comment Urinary Incontinence. -  Do you have problems with loss of bowel control? N N  Managing your Medications? N N  Managing your Finances? N N  Housekeeping or managing your Housekeeping? N N  Some recent data might be hidden    Fall/Depression Screening: Fall Risk  02/04/2020 12/03/2019 10/29/2019  Falls in the past year? 0 0 0  Number falls in past yr: - - 0  Injury with Fall? - - 0  Comment - - -  Risk for fall due to : - - No Fall Risks  Follow up - - Education provided;Falls prevention discussed  PHQ 2/9 Scores 02/04/2020 12/03/2019 11/17/2019 10/29/2019 10/15/2019 09/08/2019 09/07/2019  PHQ - 2 Score 0 0 0 0 0 0 -  Exception Documentation - - - Other- indicate reason in comment box - Other- indicate reason in comment box Other- indicate reason in comment box  Not completed - - - According to wife and primary caregiver who performed the assessment. - Wife completed assessment. -    Assessment:  Goals Addressed            This Visit's Progress   . COMPLETED: [facility] - Monitor and Manage My Blood Sugar-Diabetes Type 2   On track    Timeframe:  Short-Term Goal Priority:  Medium Start Date:          02/23/2020                   Expected End Date:        03/22/2020               Follow Up Date:   03/22/2020   - check blood sugar at prescribed times - check blood sugar if I feel it is too high or too low - enter blood sugar readings and medication or insulin into daily log    Why is this important?    Checking your blood sugar at home helps to keep it from getting very high or very low.   Writing the results in a diary or log helps the doctor know how to care for you.   Your blood sugar log should have the time, date and the results.   Also, write down the amount of insulin or other medicine that you take.   Other information, like what you ate, exercise done and how you were feeling, will also be helpful.     Notes:     . [facility] - Track and Manage Heart Rate and Rhythm   On track    Follow Up Date 4/4  Timeframe:  Long-Range Goal Priority:  High Start Date:    3/2                      Expected End Date:       5/2              - begin a symptom diary - check pulse (heart) rate before taking medicine - make a plan to exercise regularly - make a plan to eat healthy    Why is this important?   Atrial fibrillation may have no symptoms. Sometimes the symptoms get worse or happen more often.  It is important to keep track of what your symptoms are and when they happen.  A change in symptoms is important to discuss with your doctor or nurse.  Being active and healthy eating will also help you manage your heart condition.     Notes:   11/24 - Reviewed upcoming appointment with cardiology, encouraged to monitor HR and BP and take trends for review  12/22 - Will send BP monitor to home for daily monitoring  2/2 - Reminded to recording daily readings, will send calendar with logs        Plan:  Follow-up:  Patient agrees to Care Plan and Follow-up.  Will follow up within the next month.  [name], [place], MSN [place] [phone]

## 2020-03-24 ENCOUNTER — Ambulatory Visit (INDEPENDENT_AMBULATORY_CARE_PROVIDER_SITE_OTHER): Payer: Medicare Other | Admitting: Hospice and Palliative Medicine

## 2020-03-24 ENCOUNTER — Encounter: Payer: Self-pay | Admitting: Hospice and Palliative Medicine

## 2020-03-24 ENCOUNTER — Other Ambulatory Visit: Payer: Self-pay

## 2020-03-24 VITALS — BP 132/90 | HR 97 | Temp 97.0°F | Resp 16 | Ht 72.0 in | Wt 247.4 lb

## 2020-03-24 DIAGNOSIS — I10 Essential (primary) hypertension: Secondary | ICD-10-CM | POA: Diagnosis not present

## 2020-03-24 DIAGNOSIS — R053 Chronic cough: Secondary | ICD-10-CM

## 2020-03-24 DIAGNOSIS — N1832 Chronic kidney disease, stage 3b: Secondary | ICD-10-CM | POA: Diagnosis not present

## 2020-03-24 DIAGNOSIS — E1122 Type 2 diabetes mellitus with diabetic chronic kidney disease: Secondary | ICD-10-CM | POA: Diagnosis not present

## 2020-03-24 DIAGNOSIS — R0609 Other forms of dyspnea: Secondary | ICD-10-CM

## 2020-03-24 DIAGNOSIS — J439 Emphysema, unspecified: Secondary | ICD-10-CM

## 2020-03-24 DIAGNOSIS — R0600 Dyspnea, unspecified: Secondary | ICD-10-CM | POA: Diagnosis not present

## 2020-03-24 DIAGNOSIS — C61 Malignant neoplasm of prostate: Secondary | ICD-10-CM

## 2020-03-24 MED ORDER — BENZONATATE 100 MG PO CAPS: 100.0000 mg | ORAL_CAPSULE | Freq: Two times a day (BID) | ORAL | 0 refills | Status: DC | PRN

## 2020-03-24 MED ORDER — ONETOUCH VERIO VI STRP
ORAL_STRIP | 3 refills | Status: DC
Start: 2020-03-24 — End: 2020-12-08

## 2020-03-24 NOTE — Progress Notes (Signed)
[facility] [place], [name] [address]  Internal MEDICINE  Office Visit Note  Patient Name: [name]  [id number]  [id number]  Date of Service: 03/27/2020  Chief Complaint  Patient presents with  . Follow-up    Discuss meds  . Diabetes  . Hypertension    HPI Patient is here for routine follow-up Close follow-up to review home glucose readings due to recent changes to diabetic meds Some confusion--he has been injecting only 4 units of Toujeo daily, has continued Januvia daily Home glucose readings have been averaging 160-200  Complaining of ongoing chronic cough and shortness of breath Has had recent extensive cardiac work-up and management On recent imaging from hospitalizations evidence of post-inflammatory changes noted Positive smoking history  Multiple hospitalizations a few months prior, GI bleed--anticoagulation stopped, resulted in CVA--recovering well Back to working full-time Recent diagnosis of recurring prostate cancer--currently undergoing treatments  Current Medication: Outpatient Encounter Medications as of 03/24/2020  Medication Sig  . benzonatate (TESSALON) 100 MG capsule Take 1 capsule (100 mg total) by mouth 2 (two) times daily as needed for cough.  Marland Kitchen albuterol (VENTOLIN HFA) 108 (90 Base) MCG/ACT inhaler Inhale 2 puffs into the lungs every 4 (four) hours as needed for wheezing or shortness of breath.  Marland Kitchen apixaban (ELIQUIS) 2.5 MG TABS tablet Take 2.5 mg by mouth 2 (two) times daily.  Marland Kitchen atorvastatin (LIPITOR) 10 MG tablet Take 1 tablet (10 mg total) by mouth daily.  . ferrous sulfate 325 (65 FE) MG tablet Take 325 mg by mouth daily.   Marland Kitchen glucose blood (ONETOUCH VERIO) test strip USE ONCE DAILY. DX E11.65.  Marland Kitchen insulin glargine, 2 Unit Dial, (TOUJEO MAX SOLOSTAR) 300 UNIT/ML Solostar Pen Inject 10 Units into the skin daily.  . Insulin Pen Needle (PEN NEEDLES 3/16") 31G X 5 MM MISC To use daily with basal insulin injections  .  JANUVIA 25 MG tablet TAKE 1 TABLET(25 MG) BY MOUTH DAILY  . metoprolol tartrate (LOPRESSOR) 25 MG tablet Take 1 tablet (25 mg total) by mouth 2 (two) times daily.  . pantoprazole (PROTONIX) 40 MG tablet Take 1 tablet (40 mg total) by mouth daily.  Marland Kitchen senna-docusate (SENOKOT-S) 8.6-50 MG tablet Take 1 tablet by mouth at bedtime as needed for mild constipation.  . vitamin B-12 (CYANOCOBALAMIN) 1000 MCG tablet Take 1 tablet (1,000 mcg total) by mouth daily.  . [DISCONTINUED] glucose blood (ONETOUCH VERIO) test strip USE ONCE DAILY. DX E11.65.   No facility-administered encounter medications on file as of 03/24/2020.    Surgical History: Past Surgical History:  Procedure Laterality Date  . CATARACT EXTRACTION    . COLONOSCOPY WITH PROPOFOL N/A 07/02/2019   Procedure: COLONOSCOPY WITH PROPOFOL;  Surgeon: Glenn Landsman, MD;  Location: [facility] ENDOSCOPY;  Service: Gastroenterology;  Laterality: N/A;  . IR CT HEAD LTD  08/27/2019  . IR PERCUTANEOUS ART THROMBECTOMY/INFUSION INTRACRANIAL INC DIAG ANGIO  08/27/2019      . IR PERCUTANEOUS ART THROMBECTOMY/INFUSION INTRACRANIAL INC DIAG ANGIO  08/27/2019  . PROSTATE CANCER    . PROSTATE SURGERY    . RADIOLOGY WITH ANESTHESIA N/A 08/27/2019   Procedure: IR WITH ANESTHESIA;  Surgeon: Glenn Bras, MD;  Location: [place];  Service: Radiology;  Laterality: N/A;    Medical History: Past Medical History:  Diagnosis Date  . Atrial fibrillation ([place])   . CHF (congestive heart failure) ([place])   . Diabetes ([place])   . Hearing loss   . High blood pressure   . History  of bladder problems   . Kidney stones 11/22/2014  . Prostate cancer ([place])   . Prostate cancer ([place])   . Stroke [facility])     Family History: Family History  Problem Relation Age of Onset  . Colon cancer Mother   . Diabetes Other   . High blood pressure Other   . Prostate cancer Brother   . Diabetes Brother     Social History   Socioeconomic History  . Marital status: Married    Spouse  name: [name]  . Number of children: 2  . Years of education: 72  . Highest education level: 12th grade  Occupational History  . Occupation: [employer]  Tobacco Use  . Smoking status: Former Smoker    Years: 16.00    Types: Cigarettes  . Smokeless tobacco: Never Used  Vaping Use  . Vaping Use: Never used  Substance and Sexual Activity  . Alcohol use: No  . Drug use: No  . Sexual activity: Not Currently  Other Topics Concern  . Not on file  Social History Narrative  . Not on file   Social Determinants of Health   Financial Resource Strain: Low Risk   . Difficulty of Paying Living Expenses: Not very hard  Food Insecurity: No Food Insecurity  . Worried About Charity fundraiser in the Last Year: Never true  . Ran Out of Food in the Last Year: Never true  Transportation Needs: No Transportation Needs  . Lack of Transportation (Medical): No  . Lack of Transportation (Non-Medical): No  Physical Activity: Inactive  . Days of Exercise per Week: 0 days  . Minutes of Exercise per Session: 0 min  Stress: No Stress Concern Present  . Feeling of Stress : Only a little  Social Connections: Moderately Integrated  . Frequency of Communication with Friends and Family: More than three times a week  . Frequency of Social Gatherings with Friends and Family: More than three times a week  . Attends Religious Services: More than 4 times per year  . Active Member of Clubs or Organizations: No  . Attends Archivist Meetings: Never  . Marital Status: Married  Human resources officer Violence: Not At Risk  . Fear of Current or Ex-Partner: No  . Emotionally Abused: No  . Physically Abused: No  . Sexually Abused: No      Review of Systems  Constitutional: Negative for chills, fatigue and unexpected weight change.  HENT: Negative for congestion, postnasal drip, rhinorrhea, sneezing and sore throat.   Eyes: Negative for redness.  Respiratory: Positive for cough and shortness of  breath. Negative for chest tightness.   Cardiovascular: Negative for chest pain and palpitations.  Gastrointestinal: Negative for abdominal pain, constipation, diarrhea, nausea and vomiting.  Genitourinary: Negative for dysuria and frequency.  Musculoskeletal: Negative for arthralgias, back pain, joint swelling and neck pain.  Skin: Negative for rash.  Neurological: Negative for tremors and numbness.  Hematological: Negative for adenopathy. Does not bruise/bleed easily.  Psychiatric/Behavioral: Negative for behavioral problems (Depression), sleep disturbance and suicidal ideas. The patient is not nervous/anxious.     Vital Signs: BP 132/90   Pulse 97   Temp (!) 97 F (36.1 C)   Resp 16   Ht 6' (1.829 m)   Wt 247 lb 6.4 oz (112.2 kg)   SpO2 97%   BMI 33.55 kg/m    Physical Exam Vitals reviewed.  Constitutional:      Appearance: Normal appearance. He is normal weight.  Cardiovascular:  Rate and Rhythm: Normal rate and regular rhythm.     Pulses: Normal pulses.     Heart sounds: Normal heart sounds.  Pulmonary:     Effort: Pulmonary effort is normal.     Breath sounds: Normal breath sounds.  Abdominal:     General: Abdomen is flat.     Palpations: Abdomen is soft.  Musculoskeletal:        General: Normal range of motion.     Cervical back: Normal range of motion.  Skin:    General: Skin is warm.  Neurological:     General: No focal deficit present.     Mental Status: He is alert and oriented to person, place, and time. Mental status is at baseline.  Psychiatric:        Mood and Affect: Mood normal.        Behavior: Behavior normal.        Thought Content: Thought content normal.        Judgment: Judgment normal.    Assessment/Plan: 1. Type 2 diabetes mellitus with stage 3b chronic kidney disease, unspecified whether long term insulin use (HCC) Increase dose of Toujeo to 6 units from current dose of 4 units daily Will check in in 1 week to review home readings  and continue adjusting therapy as indicated - glucose blood (ONETOUCH VERIO) test strip; USE ONCE DAILY. DX E11.65.  Dispense: 100 each; Refill: 3  2. Dyspnea on exertion Review PFT and adjust plan of care accordingly, not currently on inhalers Positive smoking history - Pulmonary function test; Future  3. Chronic cough Smoking history, may use Tessalon as needed for ongoing cough Get baseline PFT - benzonatate (TESSALON) 100 MG capsule; Take 1 capsule (100 mg total) by mouth 2 (two) times daily as needed for cough.  Dispense: 20 capsule; Refill: 0 - Pulmonary function test; Future  4. Prostate cancer ([place]) Followed by oncology, currently undergoing treatment  5. Essential hypertension BP and HR well controlled, continue to monitor  General Counseling: [name] understanding of the findings of todays visit and agrees with plan of treatment. I have discussed any further diagnostic evaluation that may be needed or ordered today. We also reviewed his medications today. he has been encouraged to call the office with any questions or concerns that should arise related to todays visit.    Orders Placed This Encounter  Procedures  . Pulmonary function test    Meds ordered this encounter  Medications  . glucose blood (ONETOUCH VERIO) test strip    Sig: USE ONCE DAILY. DX E11.65.    Dispense:  100 each    Refill:  3  . benzonatate (TESSALON) 100 MG capsule    Sig: Take 1 capsule (100 mg total) by mouth 2 (two) times daily as needed for cough.    Dispense:  20 capsule    Refill:  0    Time spent: 30 Minutes Time spent includes review of chart, medications, test results and follow-up plan with the patient.  This patient was seen by Theodoro Grist AGNP-C in Collaboration with Dr Lavera Guise as a part of collaborative care agreement     Tanna Furry. Laporche Martelle AGNP-C Internal medicine

## 2020-03-27 ENCOUNTER — Encounter: Payer: Self-pay | Admitting: Hospice and Palliative Medicine

## 2020-03-29 ENCOUNTER — Inpatient Hospital Stay: Payer: Medicare Other

## 2020-03-31 ENCOUNTER — Inpatient Hospital Stay: Payer: Medicare Other

## 2020-03-31 ENCOUNTER — Ambulatory Visit (INDEPENDENT_AMBULATORY_CARE_PROVIDER_SITE_OTHER): Payer: Medicare Other | Admitting: Hospice and Palliative Medicine

## 2020-03-31 ENCOUNTER — Inpatient Hospital Stay: Payer: Medicare Other | Admitting: Oncology

## 2020-03-31 ENCOUNTER — Other Ambulatory Visit: Payer: Medicare Other

## 2020-03-31 ENCOUNTER — Other Ambulatory Visit: Payer: Self-pay

## 2020-03-31 ENCOUNTER — Encounter: Payer: Self-pay | Admitting: Hospice and Palliative Medicine

## 2020-03-31 VITALS — Ht 72.0 in | Wt 247.0 lb

## 2020-03-31 DIAGNOSIS — N1832 Chronic kidney disease, stage 3b: Secondary | ICD-10-CM

## 2020-03-31 DIAGNOSIS — R053 Chronic cough: Secondary | ICD-10-CM

## 2020-03-31 DIAGNOSIS — E1122 Type 2 diabetes mellitus with diabetic chronic kidney disease: Secondary | ICD-10-CM

## 2020-03-31 NOTE — Progress Notes (Signed)
[facility] [place], [place] [address]  Internal MEDICINE  Telephone Visit  Patient Name: [name]  [id number]  [id number]  Date of Service: 04/03/2020  I connected with the patient at 1211 by telephone and verified the patients identity using two identifiers.   I discussed the limitations, risks, security and privacy concerns of performing an evaluation and management service by telephone and the availability of in person appointments. I also discussed with the patient that there may be a patient responsible charge related to the service.  The patient expressed understanding and agrees to proceed.    Chief Complaint  Patient presents with  . Diabetes    1 week fup blood sugar today was 139 fasting    HPI Patient being seen virtually today for routine follow-up Close follow-up to review glucose readings since office visit last week, we have been working on adjusting Toujeo dosing with goal to discontinue Januvia as he feels this causes GI upset Since out last visit he has continued Januvia daily and increased Toujeo dosing to 6 units per day Home glucose readings have averaged 130-150 over the last week, improved since last average on home readings Tolerating Toujeo well without negative side effects  Cough has improved, taking Tessalon only as needed PFT scheduled for later this month  Current Medication: Outpatient Encounter Medications as of 03/31/2020  Medication Sig  . albuterol (VENTOLIN HFA) 108 (90 Base) MCG/ACT inhaler Inhale 2 puffs into the lungs every 4 (four) hours as needed for wheezing or shortness of breath.  Marland Kitchen apixaban (ELIQUIS) 2.5 MG TABS tablet Take 2.5 mg by mouth 2 (two) times daily.  Marland Kitchen atorvastatin (LIPITOR) 10 MG tablet Take 1 tablet (10 mg total) by mouth daily.  . benzonatate (TESSALON) 100 MG capsule Take 1 capsule (100 mg total) by mouth 2 (two) times daily as needed for cough.  . ferrous sulfate 325 (65 FE) MG tablet  Take 325 mg by mouth daily.   Marland Kitchen glucose blood (ONETOUCH VERIO) test strip USE ONCE DAILY. DX E11.65.  Marland Kitchen insulin glargine, 2 Unit Dial, (TOUJEO MAX SOLOSTAR) 300 UNIT/ML Solostar Pen Inject 10 Units into the skin daily.  . Insulin Pen Needle (PEN NEEDLES 3/16") 31G X 5 MM MISC To use daily with basal insulin injections  . JANUVIA 25 MG tablet TAKE 1 TABLET(25 MG) BY MOUTH DAILY  . metoprolol tartrate (LOPRESSOR) 25 MG tablet Take 1 tablet (25 mg total) by mouth 2 (two) times daily.  . pantoprazole (PROTONIX) 40 MG tablet Take 1 tablet (40 mg total) by mouth daily.  Marland Kitchen senna-docusate (SENOKOT-S) 8.6-50 MG tablet Take 1 tablet by mouth at bedtime as needed for mild constipation.  . vitamin B-12 (CYANOCOBALAMIN) 1000 MCG tablet Take 1 tablet (1,000 mcg total) by mouth daily.   No facility-administered encounter medications on file as of 03/31/2020.    Surgical History: Past Surgical History:  Procedure Laterality Date  . CATARACT EXTRACTION    . COLONOSCOPY WITH PROPOFOL N/A 07/02/2019   Procedure: COLONOSCOPY WITH PROPOFOL;  Surgeon: Lin Landsman, MD;  Location: [facility] ENDOSCOPY;  Service: Gastroenterology;  Laterality: N/A;  . IR CT HEAD LTD  08/27/2019  . IR PERCUTANEOUS ART THROMBECTOMY/INFUSION INTRACRANIAL INC DIAG ANGIO  08/27/2019      . IR PERCUTANEOUS ART THROMBECTOMY/INFUSION INTRACRANIAL INC DIAG ANGIO  08/27/2019  . PROSTATE CANCER    . PROSTATE SURGERY    . RADIOLOGY WITH ANESTHESIA N/A 08/27/2019   Procedure: IR WITH ANESTHESIA;  Surgeon:  Luanne Bras, MD;  Location: [place];  Service: Radiology;  Laterality: N/A;    Medical History: Past Medical History:  Diagnosis Date  . Atrial fibrillation ([place])   . CHF (congestive heart failure) ([place])   . Diabetes ([place])   . Hearing loss   . High blood pressure   . History of bladder problems   . Kidney stones 11/22/2014  . Prostate cancer ([place])   . Prostate cancer ([place])   . Stroke [facility])     Family History: Family History   Problem Relation Age of Onset  . Colon cancer Mother   . Diabetes Other   . High blood pressure Other   . Prostate cancer Brother   . Diabetes Brother     Social History   Socioeconomic History  . Marital status: Married    Spouse name: Jerrold Haskell  . Number of children: 2  . Years of education: 4  . Highest education level: 12th grade  Occupational History  . Occupation: [employer]  Tobacco Use  . Smoking status: Former Smoker    Years: 16.00    Types: Cigarettes  . Smokeless tobacco: Never Used  Vaping Use  . Vaping Use: Never used  Substance and Sexual Activity  . Alcohol use: No  . Drug use: No  . Sexual activity: Not Currently  Other Topics Concern  . Not on file  Social History Narrative  . Not on file   Social Determinants of Health   Financial Resource Strain: Low Risk   . Difficulty of Paying Living Expenses: Not very hard  Food Insecurity: No Food Insecurity  . Worried About Charity fundraiser in the Last Year: Never true  . Ran Out of Food in the Last Year: Never true  Transportation Needs: No Transportation Needs  . Lack of Transportation (Medical): No  . Lack of Transportation (Non-Medical): No  Physical Activity: Inactive  . Days of Exercise per Week: 0 days  . Minutes of Exercise per Session: 0 min  Stress: No Stress Concern Present  . Feeling of Stress : Only a little  Social Connections: Moderately Integrated  . Frequency of Communication with Friends and Family: More than three times a week  . Frequency of Social Gatherings with Friends and Family: More than three times a week  . Attends Religious Services: More than 4 times per year  . Active Member of Clubs or Organizations: No  . Attends Archivist Meetings: Never  . Marital Status: Married  Human resources officer Violence: Not At Risk  . Fear of Current or Ex-Partner: No  . Emotionally Abused: No  . Physically Abused: No  . Sexually Abused: No      Review of Systems   Constitutional: Negative for chills, fatigue and unexpected weight change.  HENT: Negative for congestion, postnasal drip, rhinorrhea, sneezing and sore throat.   Eyes: Negative for redness.  Respiratory: Negative for cough, chest tightness and shortness of breath.   Cardiovascular: Negative for chest pain and palpitations.  Gastrointestinal: Negative for abdominal pain, constipation, diarrhea, nausea and vomiting.  Genitourinary: Negative for dysuria and frequency.  Musculoskeletal: Negative for arthralgias, back pain, joint swelling and neck pain.  Skin: Negative for rash.  Neurological: Negative for tremors and numbness.  Hematological: Negative for adenopathy. Does not bruise/bleed easily.  Psychiatric/Behavioral: Negative for behavioral problems (Depression), sleep disturbance and suicidal ideas. The patient is not nervous/anxious.     Vital Signs: Ht 6' (1.829 m)   Wt 247 lb (112 kg)  BMI 33.50 kg/m    Observation/Objective: Well appearing, answers questions appropriately. No signs or evidence of acute distress.  Assessment/Plan: 1. Type 2 diabetes mellitus with stage 3b chronic kidney disease, unspecified whether long term insulin use (HCC) Increase dose of Toujeo to 8 units daily Discontinue Januvia at this time, will closely monitor glucose levels, follow-up in 1 week  2. Chronic cough Awaiting PFT to proceed with appropriate treatment Coughing reportedly improved at this time  General Counseling: Tia Masker understanding of the findings of today's phone visit and agrees with plan of treatment. I have discussed any further diagnostic evaluation that may be needed or ordered today. We also reviewed his medications today. he has been encouraged to call the office with any questions or concerns that should arise related to todays visit.    Time spent: 25 Minutes Time spent includes review of chart, medications, test results and follow-up plan with the  patient.   Tanna Furry Vester Balthazor AGNP-C Internal medicine

## 2020-04-03 ENCOUNTER — Encounter: Payer: Self-pay | Admitting: Hospice and Palliative Medicine

## 2020-04-03 ENCOUNTER — Inpatient Hospital Stay: Payer: Medicare Other | Attending: Oncology

## 2020-04-03 DIAGNOSIS — C772 Secondary and unspecified malignant neoplasm of intra-abdominal lymph nodes: Secondary | ICD-10-CM | POA: Insufficient documentation

## 2020-04-03 DIAGNOSIS — Z794 Long term (current) use of insulin: Secondary | ICD-10-CM | POA: Diagnosis not present

## 2020-04-03 DIAGNOSIS — Z9079 Acquired absence of other genital organ(s): Secondary | ICD-10-CM | POA: Insufficient documentation

## 2020-04-03 DIAGNOSIS — Z8673 Personal history of transient ischemic attack (TIA), and cerebral infarction without residual deficits: Secondary | ICD-10-CM | POA: Diagnosis not present

## 2020-04-03 DIAGNOSIS — Z87891 Personal history of nicotine dependence: Secondary | ICD-10-CM | POA: Insufficient documentation

## 2020-04-03 DIAGNOSIS — C61 Malignant neoplasm of prostate: Secondary | ICD-10-CM | POA: Diagnosis present

## 2020-04-03 DIAGNOSIS — E119 Type 2 diabetes mellitus without complications: Secondary | ICD-10-CM | POA: Insufficient documentation

## 2020-04-03 DIAGNOSIS — Z79899 Other long term (current) drug therapy: Secondary | ICD-10-CM | POA: Diagnosis not present

## 2020-04-03 DIAGNOSIS — I4891 Unspecified atrial fibrillation: Secondary | ICD-10-CM | POA: Diagnosis not present

## 2020-04-03 DIAGNOSIS — Z7901 Long term (current) use of anticoagulants: Secondary | ICD-10-CM | POA: Insufficient documentation

## 2020-04-03 DIAGNOSIS — I509 Heart failure, unspecified: Secondary | ICD-10-CM | POA: Diagnosis not present

## 2020-04-03 LAB — CBC WITH DIFFERENTIAL/PLATELET
Abs Immature Granulocytes: 0.01 10*3/uL (ref 0.00–0.07)
Basophils Absolute: 0 10*3/uL (ref 0.0–0.1)
Basophils Relative: 0 %
Eosinophils Absolute: 0.1 10*3/uL (ref 0.0–0.5)
Eosinophils Relative: 2 %
HCT: 45.9 % (ref 39.0–52.0)
Hemoglobin: 14.3 g/dL (ref 13.0–17.0)
Immature Granulocytes: 0 %
Lymphocytes Relative: 32 %
Lymphs Abs: 1.9 10*3/uL (ref 0.7–4.0)
MCH: 28.5 pg (ref 26.0–34.0)
MCHC: 31.2 g/dL (ref 30.0–36.0)
MCV: 91.4 fL (ref 80.0–100.0)
Monocytes Absolute: 0.6 10*3/uL (ref 0.1–1.0)
Monocytes Relative: 11 %
Neutro Abs: 3.3 10*3/uL (ref 1.7–7.7)
Neutrophils Relative %: 55 %
Platelets: 148 10*3/uL — ABNORMAL LOW (ref 150–400)
RBC: 5.02 MIL/uL (ref 4.22–5.81)
RDW: 14.4 % (ref 11.5–15.5)
WBC: 6 10*3/uL (ref 4.0–10.5)
nRBC: 0 % (ref 0.0–0.2)

## 2020-04-03 LAB — COMPREHENSIVE METABOLIC PANEL
ALT: 27 U/L (ref 0–44)
AST: 26 U/L (ref 15–41)
Albumin: 3.8 g/dL (ref 3.5–5.0)
Alkaline Phosphatase: 98 U/L (ref 38–126)
Anion gap: 9 (ref 5–15)
BUN: 31 mg/dL — ABNORMAL HIGH (ref 8–23)
CO2: 27 mmol/L (ref 22–32)
Calcium: 9.3 mg/dL (ref 8.9–10.3)
Chloride: 102 mmol/L (ref 98–111)
Creatinine, Ser: 2.22 mg/dL — ABNORMAL HIGH (ref 0.61–1.24)
GFR, Estimated: 29 mL/min — ABNORMAL LOW (ref >60)
Glucose, Bld: 209 mg/dL — ABNORMAL HIGH (ref 70–99)
Potassium: 4.6 mmol/L (ref 3.5–5.1)
Sodium: 138 mmol/L (ref 135–145)
Total Bilirubin: 0.7 mg/dL (ref 0.3–1.2)
Total Protein: 7.8 g/dL (ref 6.5–8.1)

## 2020-04-03 LAB — PSA: Prostatic Specific Antigen: 38.82 ng/mL — ABNORMAL HIGH (ref 0.00–4.00)

## 2020-04-05 ENCOUNTER — Encounter: Payer: Self-pay | Admitting: Oncology

## 2020-04-05 ENCOUNTER — Inpatient Hospital Stay (HOSPITAL_BASED_OUTPATIENT_CLINIC_OR_DEPARTMENT_OTHER): Payer: Medicare Other | Admitting: Oncology

## 2020-04-05 ENCOUNTER — Inpatient Hospital Stay: Payer: Medicare Other

## 2020-04-05 VITALS — BP 129/77 | HR 69 | Temp 95.6°F | Resp 16 | Wt 249.0 lb

## 2020-04-05 DIAGNOSIS — C772 Secondary and unspecified malignant neoplasm of intra-abdominal lymph nodes: Secondary | ICD-10-CM | POA: Diagnosis not present

## 2020-04-05 DIAGNOSIS — Z79818 Long term (current) use of other agents affecting estrogen receptors and estrogen levels: Secondary | ICD-10-CM | POA: Diagnosis not present

## 2020-04-05 DIAGNOSIS — C61 Malignant neoplasm of prostate: Secondary | ICD-10-CM

## 2020-04-05 DIAGNOSIS — Z794 Long term (current) use of insulin: Secondary | ICD-10-CM | POA: Diagnosis not present

## 2020-04-05 DIAGNOSIS — Z7189 Other specified counseling: Secondary | ICD-10-CM | POA: Diagnosis not present

## 2020-04-05 DIAGNOSIS — Z87891 Personal history of nicotine dependence: Secondary | ICD-10-CM | POA: Diagnosis not present

## 2020-04-05 DIAGNOSIS — Z8673 Personal history of transient ischemic attack (TIA), and cerebral infarction without residual deficits: Secondary | ICD-10-CM | POA: Diagnosis not present

## 2020-04-05 DIAGNOSIS — Z79899 Other long term (current) drug therapy: Secondary | ICD-10-CM | POA: Diagnosis not present

## 2020-04-05 DIAGNOSIS — I4891 Unspecified atrial fibrillation: Secondary | ICD-10-CM | POA: Diagnosis not present

## 2020-04-05 DIAGNOSIS — IMO0001 Reserved for inherently not codable concepts without codable children: Secondary | ICD-10-CM

## 2020-04-05 DIAGNOSIS — E119 Type 2 diabetes mellitus without complications: Secondary | ICD-10-CM | POA: Diagnosis not present

## 2020-04-05 DIAGNOSIS — Z7901 Long term (current) use of anticoagulants: Secondary | ICD-10-CM | POA: Diagnosis not present

## 2020-04-05 DIAGNOSIS — I509 Heart failure, unspecified: Secondary | ICD-10-CM | POA: Diagnosis not present

## 2020-04-05 MED ORDER — LEUPROLIDE ACETATE (3 MONTH) 22.5 MG ~~LOC~~ KIT
22.5000 mg | PACK | Freq: Once | SUBCUTANEOUS | Status: AC
  Administered 2020-04-05: 22.5 mg via SUBCUTANEOUS
  Filled 2020-04-05: qty 22.5
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
## 2020-04-05 NOTE — Progress Notes (Signed)
Patient has a cough that has been evaluated and treated by PCP.  When he has this cough it causes chest pains.  Also has been seen by his cardiologist.  Reports new nose bleeds that last about 10 minutes about twice a week.

## 2020-04-07 ENCOUNTER — Other Ambulatory Visit: Payer: Self-pay

## 2020-04-07 ENCOUNTER — Ambulatory Visit: Payer: Medicare Other | Admitting: Podiatry

## 2020-04-07 ENCOUNTER — Other Ambulatory Visit: Payer: Self-pay | Admitting: Hospice and Palliative Medicine

## 2020-04-07 ENCOUNTER — Encounter: Payer: Self-pay | Admitting: Podiatry

## 2020-04-07 DIAGNOSIS — M79675 Pain in left toe(s): Secondary | ICD-10-CM | POA: Diagnosis not present

## 2020-04-07 DIAGNOSIS — E0843 Diabetes mellitus due to underlying condition with diabetic autonomic (poly)neuropathy: Secondary | ICD-10-CM | POA: Diagnosis not present

## 2020-04-07 DIAGNOSIS — I10 Essential (primary) hypertension: Secondary | ICD-10-CM | POA: Diagnosis not present

## 2020-04-07 DIAGNOSIS — L989 Disorder of the skin and subcutaneous tissue, unspecified: Secondary | ICD-10-CM | POA: Diagnosis not present

## 2020-04-07 DIAGNOSIS — M79674 Pain in right toe(s): Secondary | ICD-10-CM

## 2020-04-07 DIAGNOSIS — R234 Changes in skin texture: Secondary | ICD-10-CM

## 2020-04-07 DIAGNOSIS — I5032 Chronic diastolic (congestive) heart failure: Secondary | ICD-10-CM | POA: Diagnosis not present

## 2020-04-07 DIAGNOSIS — I482 Chronic atrial fibrillation, unspecified: Secondary | ICD-10-CM | POA: Diagnosis not present

## 2020-04-07 DIAGNOSIS — B351 Tinea unguium: Secondary | ICD-10-CM | POA: Diagnosis not present

## 2020-04-07 DIAGNOSIS — E782 Mixed hyperlipidemia: Secondary | ICD-10-CM | POA: Diagnosis not present

## 2020-04-07 DIAGNOSIS — G4733 Obstructive sleep apnea (adult) (pediatric): Secondary | ICD-10-CM | POA: Diagnosis not present

## 2020-04-07 NOTE — Progress Notes (Signed)
Called and spoke to Mr. Lina, has been taking 8 units of Toujeo and stopped Januvia this week. Glucose levels have been stable, averaging 130-150. Will increase Toujeo to 10 units and continue to hold Januvia.

## 2020-04-07 NOTE — Progress Notes (Signed)
   SUBJECTIVE Patient with a history of diabetes mellitus presents to office today complaining of elongated, thickened nails that cause pain while ambulating in shoes.  He is unable to trim his own nails. Patient is here for further evaluation and treatment. Patient also complains of dry scaling skin to the back of the heels bilateral.  He states that every now and then he gets cracks in the skin that can be symptomatic.  He would like to know different treatment options for the dry skin  Past Medical History:  Diagnosis Date  . Atrial fibrillation ([place])   . CHF (congestive heart failure) ([place])   . Diabetes ([place])   . Hearing loss   . High blood pressure   . History of bladder problems   . Kidney stones 11/22/2014  . Prostate cancer ([place])   . Prostate cancer ([place])   . Stroke [facility])     OBJECTIVE General Patient is awake, alert, and oriented x 3 and in no acute distress. Derm Skin is dry and supple bilateral. Negative open lesions or macerations. Remaining integument unremarkable. Nails are tender, long, thickened and dystrophic with subungual debris, consistent with onychomycosis, 1-5 bilateral. No signs of infection noted.  Hyperkeratotic preulcerative callus tissue also noted to the bilateral feet that are symptomatic with palpation. There is also dry flaking skin with superficial heel fissures noted to the posterior heels Vasc  DP and PT pedal pulses palpable bilaterally. Temperature gradient within normal limits.  Neuro Epicritic and protective threshold sensation diminished bilaterally.  Musculoskeletal Exam No symptomatic pedal deformities noted bilateral. Muscular strength within normal limits.  ASSESSMENT 1. Diabetes Mellitus w/ peripheral neuropathy 2. Onychomycosis of nail due to dermatophyte bilateral 3.  Preulcerative callus lesions bilateral feet  4.  Fissures of skin with xerosis bilateral heels  5.  Pain in foot bilateral  PLAN OF CARE 1. Patient evaluated today. 2.  Instructed to maintain good pedal hygiene and foot care. Stressed importance of controlling blood sugar.  3. Mechanical debridement of nails 1-5 bilaterally performed using a nail nipper. Filed with dremel without incident.  4.  Excisional debridement of the hyperkeratotic preulcerative callus lesions was performed using a tissue nipper without incident or bleeding  5.  Recommend daily foot lotion under occlusion to help alleviate the dry skin and fissuring of the heels.  Recommend not going barefoot and wearing good supportive socks and shoes  6.  Return to clinic in 3 mos. for routine foot care    Edrick Kins, DPM [facility]  Dr. Edrick Kins, DPM    [address], [place] [address]                Office [phone]  Fax [phone]

## 2020-04-12 ENCOUNTER — Emergency Department
Admission: EM | Admit: 2020-04-12 | Discharge: 2020-04-12 | Disposition: A | Discharge status: Home / Self Care | Payer: Medicare Other | Attending: Emergency Medicine | Admitting: Emergency Medicine

## 2020-04-12 ENCOUNTER — Other Ambulatory Visit: Payer: Self-pay

## 2020-04-12 DIAGNOSIS — I130 Hypertensive heart and chronic kidney disease with heart failure and stage 1 through stage 4 chronic kidney disease, or unspecified chronic kidney disease: Secondary | ICD-10-CM | POA: Insufficient documentation

## 2020-04-12 DIAGNOSIS — Z79899 Other long term (current) drug therapy: Secondary | ICD-10-CM | POA: Diagnosis not present

## 2020-04-12 DIAGNOSIS — E1122 Type 2 diabetes mellitus with diabetic chronic kidney disease: Secondary | ICD-10-CM | POA: Insufficient documentation

## 2020-04-12 DIAGNOSIS — Z8546 Personal history of malignant neoplasm of prostate: Secondary | ICD-10-CM | POA: Diagnosis not present

## 2020-04-12 DIAGNOSIS — N1832 Chronic kidney disease, stage 3b: Secondary | ICD-10-CM | POA: Diagnosis not present

## 2020-04-12 DIAGNOSIS — Z794 Long term (current) use of insulin: Secondary | ICD-10-CM | POA: Insufficient documentation

## 2020-04-12 DIAGNOSIS — I5022 Chronic systolic (congestive) heart failure: Secondary | ICD-10-CM | POA: Diagnosis not present

## 2020-04-12 DIAGNOSIS — Z7901 Long term (current) use of anticoagulants: Secondary | ICD-10-CM | POA: Diagnosis not present

## 2020-04-12 DIAGNOSIS — Z87891 Personal history of nicotine dependence: Secondary | ICD-10-CM | POA: Insufficient documentation

## 2020-04-12 DIAGNOSIS — R040 Epistaxis: Secondary | ICD-10-CM | POA: Insufficient documentation

## 2020-04-12 NOTE — ED Notes (Signed)
Nasal clamp placed. Pt on thinners.

## 2020-04-12 NOTE — ED Provider Notes (Signed)
[facility] Emergency Department Provider Note  ____________________________________________   I have reviewed the triage vital signs and the nursing notes.   HISTORY  Chief Complaint Epistaxis   History limited by: Not Limited   HPI [name] is a 87 y.o. male who presents to the emergency department today because of concern for nosebleed. Patient states that he gets nose bleeds frequently. Normally is able to stop them on his own. Today he states he started another bleed. Tried to stop it on his own but was unable to. The patient denies any shortness of breath, denies any weakness.   Records reviewed. Per medical record review patient has a history of atrial fibrillation.   Past Medical History:  Diagnosis Date  . Atrial fibrillation ([place])   . CHF (congestive heart failure) ([place])   . Diabetes ([place])   . Hearing loss   . High blood pressure   . History of bladder problems   . Kidney stones 11/22/2014  . Prostate cancer ([place])   . Prostate cancer ([place])   . Stroke [facility])     Patient Active Problem List   Diagnosis Date Noted  . Stable angina pectoris ([place]) 02/25/2020  . Other specified disorders of kidney and ureter 01/02/2020  . Abdominal distension 10/31/2019  . Atherosclerosis of aorta ([place]) 10/15/2019  . Androgen deprivation therapy 10/13/2019  . Other constipation 08/29/2019  . Acute ischemic left MCA stroke ([place]) 08/27/2019  . Normocytic anemia 08/05/2019  . Prostate cancer ([place]) 08/05/2019  . Goals of care, counseling/discussion 07/22/2019  . Fecal impaction ([place]) 07/11/2019  . Lower GI bleeding 06/30/2019  . Diverticulosis of colon 02/07/2019  . Chronic obstructive pulmonary disease ([place])   . Chronic a-fib ([place])   . Type 2 diabetes mellitus with hyperglycemia ([place]) 10/23/2018  . Secondary hypercoagulability disorder ([place]) 10/23/2018  . Acute or subacute form of ischemic heart disease ([place]) 10/23/2018  . Need for vaccination  against Streptococcus pneumoniae using pneumococcal conjugate vaccine 13 10/23/2018  . Chronic mesenteric ischemia ([place]) 07/28/2018  . Abdominal aortic aneurysm without rupture ([place]) 07/28/2018  . Primary osteoarthritis of left hip 02/12/2018  . Bilateral hearing loss due to cerumen impaction 10/06/2017  . Chronic systolic heart failure ([place]) 08/19/2017  . Atrial fibrillation with RVR ([place]) 08/02/2017  . Acute on chronic congestive heart failure ([place]) 08/02/2017  . Prostatic hypertrophy 07/20/2017  . Dysuria 07/20/2017  . Hyperpigmentation 04/10/2017  . Uncontrolled type 2 diabetes mellitus with hyperglycemia ([place]) 03/24/2017  . Encounter for general adult medical examination with abnormal findings 03/24/2017  . Chest pain 05/04/2016  . Elevated troponin 05/04/2016  . Essential hypertension 07/06/2014  . Cough 03/21/2014  . OSA (obstructive sleep apnea) 03/21/2014  . Paroxysmal A-fib ([place]) 03/21/2014  . Hx of adenomatous colonic polyps 06/28/2013  . Liver cyst 06/28/2013  . Chronic kidney disease 06/08/2013  . Type 2 diabetes mellitus with stage 3b chronic kidney disease, without long-term current use of insulin ([place]) 06/08/2013  . Hyperlipidemia, mixed 06/08/2013  . Valvular heart disease 06/08/2013    Past Surgical History:  Procedure Laterality Date  . CATARACT EXTRACTION    . COLONOSCOPY WITH PROPOFOL N/A 07/02/2019   Procedure: COLONOSCOPY WITH PROPOFOL;  Surgeon: Lin Landsman, MD;  Location: [facility] ENDOSCOPY;  Service: Gastroenterology;  Laterality: N/A;  . IR CT HEAD LTD  08/27/2019  . IR PERCUTANEOUS ART THROMBECTOMY/INFUSION INTRACRANIAL INC DIAG ANGIO  08/27/2019      . IR PERCUTANEOUS ART THROMBECTOMY/INFUSION INTRACRANIAL INC DIAG ANGIO  08/27/2019  . PROSTATE CANCER    . PROSTATE SURGERY    . RADIOLOGY WITH ANESTHESIA N/A 08/27/2019   Procedure: IR WITH ANESTHESIA;  Surgeon: Luanne Bras, MD;  Location: [facility];  Service: Radiology;  Laterality: N/A;    Prior to  Admission medications   Medication Sig Start Date End Date Taking? Authorizing Provider  albuterol (VENTOLIN HFA) 108 (90 Base) MCG/ACT inhaler Inhale 2 puffs into the lungs every 4 (four) hours as needed for wheezing or shortness of breath. 06/28/19   Ronnell Freshwater, NP  apixaban (ELIQUIS) 2.5 MG TABS tablet Take 2.5 mg by mouth 2 (two) times daily.    [provider]  atorvastatin (LIPITOR) 10 MG tablet Take 1 tablet (10 mg total) by mouth daily. 10/04/19   Ronnell Freshwater, NP  benzonatate (TESSALON) 100 MG capsule Take 1 capsule (100 mg total) by mouth 2 (two) times daily as needed for cough. 03/24/20   Luiz Ochoa, NP  ferrous sulfate 325 (65 FE) MG tablet Take 325 mg by mouth daily.     [provider]  glucose blood (ONETOUCH VERIO) test strip USE ONCE DAILY. DX E11.65. 03/24/20   Luiz Ochoa, NP  insulin glargine, 2 Unit Dial, (TOUJEO MAX SOLOSTAR) 300 UNIT/ML Solostar Pen Inject 10 Units into the skin daily. 02/04/20   Ronnell Freshwater, NP  Insulin Pen Needle (PEN NEEDLES 3/16") 31G X 5 MM MISC To use daily with basal insulin injections 02/04/20   Ronnell Freshwater, NP  metoprolol tartrate (LOPRESSOR) 25 MG tablet Take 1 tablet (25 mg total) by mouth 2 (two) times daily. 08/31/19   Vonzella Nipple, NP  pantoprazole (PROTONIX) 40 MG tablet Take 1 tablet (40 mg total) by mouth daily. 10/04/19   Ronnell Freshwater, NP  senna-docusate (SENOKOT-S) 8.6-50 MG tablet Take 1 tablet by mouth at bedtime as needed for mild constipation. 08/31/19   Vonzella Nipple, NP  vitamin B-12 (CYANOCOBALAMIN) 1000 MCG tablet Take 1 tablet (1,000 mcg total) by mouth daily. 01/10/20   Earlie Server, MD    Allergies Patient has no known allergies.  Family History  Problem Relation Age of Onset  . Colon cancer Mother   . Diabetes Other   . High blood pressure Other   . Prostate cancer Brother   . Diabetes Brother     Social History Social History   Tobacco Use  . Smoking status:  Former Smoker    Years: 16.00    Types: Cigarettes  . Smokeless tobacco: Never Used  Vaping Use  . Vaping Use: Never used  Substance Use Topics  . Alcohol use: No  . Drug use: No    Review of Systems Constitutional: No fever/chills Eyes: No visual changes. ENT: Positive for nose bleed.  Cardiovascular: Denies chest pain. Respiratory: Denies shortness of breath. Gastrointestinal: No abdominal pain.  No nausea, no vomiting.  No diarrhea.   Genitourinary: Negative for dysuria. Musculoskeletal: Negative for back pain. Skin: Negative for rash. Neurological: Negative for headaches, focal weakness or numbness.  ____________________________________________   PHYSICAL EXAM:  VITAL SIGNS: ED Triage Vitals [04/12/20 1535]  Enc Vitals Group     BP      Pulse      Resp      Temp      Temp src      SpO2      Weight 246 lb 14.6 oz (112 kg)     Height 6' (1.829 m)  Head Circumference      Peak Flow      Pain Score 0   Constitutional: Alert and oriented.  Eyes: Conjunctivae are normal.  ENT      Head: Normocephalic and atraumatic.      Nose: No blood in nares. No bleeding.      Mouth/Throat: Mucous membranes are moist.      Neck: No stridor. Hematological/Lymphatic/Immunilogical: No cervical lymphadenopathy. Cardiovascular: Normal rate, regular rhythm.  No murmurs, rubs, or gallops.  Respiratory: Normal respiratory effort without tachypnea nor retractions. Breath sounds are clear and equal bilaterally. No wheezes/rales/rhonchi. Gastrointestinal: Soft and non tender. No rebound. No guarding.  Genitourinary: Deferred Musculoskeletal: Normal range of motion in all extremities. No lower extremity edema. Neurologic:  Normal speech and language. No gross focal neurologic deficits are appreciated.  Skin:  Skin is warm, dry and intact. No rash noted. Psychiatric: Mood and affect are normal. Speech and behavior are normal. Patient exhibits appropriate insight and  judgment.  ____________________________________________    LABS (pertinent positives/negatives)  None  ____________________________________________   EKG  None  ____________________________________________    RADIOLOGY  None  ____________________________________________   PROCEDURES  Procedures  ____________________________________________   INITIAL IMPRESSION / ASSESSMENT AND PLAN / ED COURSE  Pertinent labs & imaging results that were available during my care of the patient were reviewed by me and considered in my medical decision making (see chart for details).   Patient presented to the emergency department today because of concerns for a nosebleed.  Patient had no bleeding when he arrived to the emergency department.  He was observed in the emergency department for over an hour without any further bleeding.  At this time I do think it is safe for patient be discharged home.  ____________________________________________   FINAL CLINICAL IMPRESSION(S) / ED DIAGNOSES  Final diagnoses:  Epistaxis     Note: This dictation was prepared with Dragon dictation. Any transcriptional errors that result from this process are unintentional     Nance Pear, MD 04/12/20 1645

## 2020-04-12 NOTE — ED Notes (Signed)
Patient c/o left nostril bleeding. Patient states it occurs when bending over. No bleeding noted at this time.

## 2020-04-12 NOTE — ED Triage Notes (Signed)
Pt comes pov with nosebleed since after lunch today. Pt on thinners. Clamp placed.

## 2020-04-12 NOTE — Discharge Instructions (Addendum)
Please seek medical attention for any high fevers, chest pain, shortness of breath, change in behavior, persistent vomiting, bloody stool or any other new or concerning symptoms.  

## 2020-04-13 ENCOUNTER — Ambulatory Visit: Payer: Medicare Other | Admitting: Podiatry

## 2020-04-14 ENCOUNTER — Other Ambulatory Visit: Payer: Self-pay | Admitting: Hospice and Palliative Medicine

## 2020-04-14 ENCOUNTER — Telehealth: Payer: Self-pay | Admitting: Internal Medicine

## 2020-04-14 NOTE — Progress Notes (Signed)
  Chronic Care Management   Note  04/14/2020 Name: YAKOV BERGEN MRN: [id number] DOB: [birth year]  [name] is a 87 y.o. year old male who is a primary care patient of Lavera Guise, MD. I reached out to [name] by phone today in response to a referral sent by Mr. Jaymes Graff Makar's PCP, Lavera Guise, MD.   Mr. Nabor was given information about Chronic Care Management services today including:  1. CCM service includes personalized support from designated clinical staff supervised by his physician, including individualized plan of care and coordination with other care providers 2. 24/7 contact phone numbers for assistance for urgent and routine care needs. 3. Service will only be billed when office clinical staff spend 20 minutes or more in a month to coordinate care. 4. Only one practitioner may furnish and bill the service in a calendar month. 5. The patient may stop CCM services at any time (effective at the end of the month) by phone call to the office staff.   Patient wishes to consider information provided and/or speak with a member of the care team before deciding about enrollment in care management services.   Follow up plan:   Carley Perdue [employer] Scheduler

## 2020-04-19 DIAGNOSIS — G4733 Obstructive sleep apnea (adult) (pediatric): Secondary | ICD-10-CM | POA: Diagnosis not present

## 2020-04-20 ENCOUNTER — Ambulatory Visit: Payer: Medicare Other

## 2020-04-21 ENCOUNTER — Ambulatory Visit
Admission: RE | Admit: 2020-04-21 | Discharge: 2020-04-21 | Disposition: A | Discharge status: Home / Self Care | Payer: Medicare Other | Source: Ambulatory Visit | Attending: Oncology | Admitting: Oncology

## 2020-04-21 ENCOUNTER — Other Ambulatory Visit: Payer: Self-pay

## 2020-04-21 ENCOUNTER — Encounter
Admission: RE | Admit: 2020-04-21 | Discharge: 2020-04-21 | Disposition: A | Discharge status: Home / Self Care | Payer: Medicare Other | Source: Ambulatory Visit | Attending: Oncology | Admitting: Oncology

## 2020-04-21 DIAGNOSIS — J984 Other disorders of lung: Secondary | ICD-10-CM | POA: Diagnosis not present

## 2020-04-21 DIAGNOSIS — K429 Umbilical hernia without obstruction or gangrene: Secondary | ICD-10-CM | POA: Diagnosis not present

## 2020-04-21 DIAGNOSIS — C61 Malignant neoplasm of prostate: Secondary | ICD-10-CM | POA: Insufficient documentation

## 2020-04-21 DIAGNOSIS — E119 Type 2 diabetes mellitus without complications: Secondary | ICD-10-CM | POA: Diagnosis not present

## 2020-04-21 DIAGNOSIS — K7689 Other specified diseases of liver: Secondary | ICD-10-CM | POA: Diagnosis not present

## 2020-04-21 DIAGNOSIS — J929 Pleural plaque without asbestos: Secondary | ICD-10-CM | POA: Diagnosis not present

## 2020-04-21 DIAGNOSIS — J479 Bronchiectasis, uncomplicated: Secondary | ICD-10-CM | POA: Diagnosis not present

## 2020-04-21 DIAGNOSIS — N200 Calculus of kidney: Secondary | ICD-10-CM | POA: Diagnosis not present

## 2020-04-21 DIAGNOSIS — M170 Bilateral primary osteoarthritis of knee: Secondary | ICD-10-CM | POA: Diagnosis not present

## 2020-04-21 DIAGNOSIS — I2510 Atherosclerotic heart disease of native coronary artery without angina pectoris: Secondary | ICD-10-CM | POA: Diagnosis not present

## 2020-04-21 MED ORDER — TECHNETIUM TC 99M MEDRONATE IV KIT
20.0000 | PACK | Freq: Once | INTRAVENOUS | Status: AC | PRN
  Administered 2020-04-21: 21.19 via INTRAVENOUS

## 2020-04-24 ENCOUNTER — Other Ambulatory Visit: Payer: Self-pay | Admitting: *Deleted

## 2020-04-24 NOTE — Patient Outreach (Addendum)
[facility]) Care Management  04/24/2020  [name] 10/22/1933 [id number]   Outgoing call placed to member/wife, she report he is doing well.  He was seen in the ED on 3/23 for nosebleed, discharged after evaluation.  Denies any significant bleeding since then.  He had another scan for the cancer, will have follow up to discuss findings.  Also scheduled for pulmonary testing this week, will follow up with PCP once resulted.  [place] was increased in effort to remove member from [place], state blood sugars have slightly improved with the changes.  Readings over the past week range 131-174.  State member went to get his license renewed last week but was told that he wasn't supposed to be driving, unable to renew.  They received paperwork from [facility] to take to provider office to complete.  Encouraged to do this as soon as possible as member still working and now have a new car.  State they will have paperwork completed this week.  Denies any urgent concerns, encouraged to contact this care manager with questions.  Agrees to follow up within the next month.  Goals Addressed            This Visit's Progress   . [facility] - Make and Keep All Appointments       Timeframe:  Short-Term Goal Priority:  High Start Date:       4/4                      Expected End Date:    5/4                      Follow Up Date 5/2    - call to cancel if needed - keep a calendar with prescription refill dates    Why is this important?    Part of staying healthy is seeing the doctor for follow-up care.   If you forget your appointments, there are some things you can do to stay on track.    Notes:   4/4 - appointment for PFT on 4/6, oncology on 4/6, and PCP on 4/22    . [facility] - Track and Manage Heart Rate and Rhythm   On track    Follow Up Date 4/4  Timeframe:  Long-Range Goal Priority:  High Start Date:    3/2                      Expected End Date:       5/2              - begin a  symptom diary - check pulse (heart) rate before taking medicine - make a plan to exercise regularly - make a plan to eat healthy    Why is this important?   Atrial fibrillation may have no symptoms. Sometimes the symptoms get worse or happen more often.  It is important to keep track of what your symptoms are and when they happen.  A change in symptoms is important to discuss with your doctor or nurse.  Being active and healthy eating will also help you manage your heart condition.     Notes:   11/24 - Reviewed upcoming appointment with cardiology, encouraged to monitor HR and BP and take trends for review  12/22 - Will send BP monitor to home for daily monitoring  2/2 - Reminded to recording daily readings, will send calendar with logs  4/4 - Trends: 110's-140's/70's       [name], RN, MSN [place] [phone]

## 2020-04-26 ENCOUNTER — Inpatient Hospital Stay: Payer: Medicare Other | Attending: Oncology | Admitting: Oncology

## 2020-04-26 ENCOUNTER — Encounter: Payer: Self-pay | Admitting: Oncology

## 2020-04-26 ENCOUNTER — Other Ambulatory Visit: Payer: Self-pay

## 2020-04-26 ENCOUNTER — Ambulatory Visit: Payer: Medicare Other | Admitting: Internal Medicine

## 2020-04-26 VITALS — BP 143/84 | HR 73 | Temp 97.1°F | Resp 20 | Wt 255.5 lb

## 2020-04-26 DIAGNOSIS — R053 Chronic cough: Secondary | ICD-10-CM

## 2020-04-26 DIAGNOSIS — Z79899 Other long term (current) drug therapy: Secondary | ICD-10-CM | POA: Diagnosis not present

## 2020-04-26 DIAGNOSIS — K7689 Other specified diseases of liver: Secondary | ICD-10-CM | POA: Insufficient documentation

## 2020-04-26 DIAGNOSIS — R590 Localized enlarged lymph nodes: Secondary | ICD-10-CM

## 2020-04-26 DIAGNOSIS — C772 Secondary and unspecified malignant neoplasm of intra-abdominal lymph nodes: Secondary | ICD-10-CM | POA: Insufficient documentation

## 2020-04-26 DIAGNOSIS — Z9079 Acquired absence of other genital organ(s): Secondary | ICD-10-CM | POA: Insufficient documentation

## 2020-04-26 DIAGNOSIS — Z87891 Personal history of nicotine dependence: Secondary | ICD-10-CM | POA: Insufficient documentation

## 2020-04-26 DIAGNOSIS — Z79818 Long term (current) use of other agents affecting estrogen receptors and estrogen levels: Secondary | ICD-10-CM

## 2020-04-26 DIAGNOSIS — R0602 Shortness of breath: Secondary | ICD-10-CM | POA: Diagnosis not present

## 2020-04-26 DIAGNOSIS — R0600 Dyspnea, unspecified: Secondary | ICD-10-CM

## 2020-04-26 DIAGNOSIS — C61 Malignant neoplasm of prostate: Secondary | ICD-10-CM | POA: Diagnosis present

## 2020-04-26 DIAGNOSIS — Z7189 Other specified counseling: Secondary | ICD-10-CM

## 2020-04-26 DIAGNOSIS — R0609 Other forms of dyspnea: Secondary | ICD-10-CM

## 2020-04-26 DIAGNOSIS — IMO0001 Reserved for inherently not codable concepts without codable children: Secondary | ICD-10-CM

## 2020-04-26 LAB — PULMONARY FUNCTION TEST
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
## 2020-05-10 ENCOUNTER — Encounter: Payer: Self-pay | Admitting: Hospice and Palliative Medicine

## 2020-05-10 ENCOUNTER — Ambulatory Visit (INDEPENDENT_AMBULATORY_CARE_PROVIDER_SITE_OTHER): Payer: Medicare Other | Admitting: Hospice and Palliative Medicine

## 2020-05-10 ENCOUNTER — Other Ambulatory Visit: Payer: Self-pay

## 2020-05-10 VITALS — BP 140/80 | HR 67 | Temp 96.5°F | Resp 16 | Ht 72.0 in | Wt 253.6 lb

## 2020-05-10 DIAGNOSIS — E1122 Type 2 diabetes mellitus with diabetic chronic kidney disease: Secondary | ICD-10-CM | POA: Diagnosis not present

## 2020-05-10 DIAGNOSIS — I10 Essential (primary) hypertension: Secondary | ICD-10-CM | POA: Diagnosis not present

## 2020-05-10 DIAGNOSIS — J449 Chronic obstructive pulmonary disease, unspecified: Secondary | ICD-10-CM | POA: Diagnosis not present

## 2020-05-10 DIAGNOSIS — N1832 Chronic kidney disease, stage 3b: Secondary | ICD-10-CM

## 2020-05-10 DIAGNOSIS — R300 Dysuria: Secondary | ICD-10-CM

## 2020-05-10 LAB — POCT GLYCOSYLATED HEMOGLOBIN (HGB A1C): Hemoglobin A1C: 8.5 % — AB (ref 4.0–5.6)

## 2020-05-10 MED ORDER — ALBUTEROL SULFATE HFA 108 (90 BASE) MCG/ACT IN AERS: 2.0000 | INHALATION_SPRAY | RESPIRATORY_TRACT | 1 refills | Status: DC | PRN

## 2020-05-10 MED ORDER — SPIRIVA RESPIMAT 2.5 MCG/ACT IN AERS: 2.0000 | INHALATION_SPRAY | Freq: Every day | RESPIRATORY_TRACT | 3 refills | Status: DC

## 2020-05-10 NOTE — Procedures (Signed)
[facility] [place], [address]    Complete Pulmonary Function Testing Interpretation:  FINDINGS:  The forced vital capacity is mildly decreased the FEV1 is 1.68 L which is 67% of predicted and is mildly decreased.  Postbronchodilator there did appear to be significant improvement in the FEV1.  The FEV1 FVC ratio is mildly decreased.  Total lung capacity is moderately decreased residual volume is decreased residual volume total lung capacity ratio is increased.  FRC is moderately decreased.  The DLCO is within normal limits.  IMPRESSION:  This pulmonary function study is consistent with mild obstructive and moderate restrictive lung disease.  There does appear to be significant improvement after bronchodilators  Allyne Gee, MD [facility] Pulmonary Critical Care Medicine Sleep Medicine

## 2020-05-10 NOTE — Progress Notes (Signed)
[facility] [place], [place] [address]  Internal MEDICINE  Office Visit Note  Patient Name: [name]  [id number]  [id number]  Date of Service: 05/17/2020  Chief Complaint  Patient presents with  . Follow-up    Review pft, dpt of transportation paperwork, urine frequency, urgency, doesn't always void a lot when he has urgency, discuss meds  . Diabetes  . 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, only driving short distances basically to and from work Reviewed PFT--mild obstructive and moderate restrictive lung disease Reports some shortness of breath with exertion Not currently using any inhalers--has had albuterol inhaler in the past but expired and he never got an updated one DM-have been working on increasing dosing of Toujeo to control glucose readings, he reports home readings have been averaging 160-180 Currently on 10 units of Toujeo--some nights if he doesn't eat a big meal he will only inject 6-8 units  Current Medication: Outpatient Encounter Medications as of 05/10/2020  Medication Sig  . Tiotropium Bromide Monohydrate (SPIRIVA RESPIMAT) 2.5 MCG/ACT AERS Inhale 2 puffs into the lungs daily.  Marland Kitchen albuterol (VENTOLIN HFA) 108 (90 Base) MCG/ACT inhaler Inhale 2 puffs into the lungs every 4 (four) hours as needed for wheezing or shortness of breath.  Marland Kitchen apixaban (ELIQUIS) 2.5 MG TABS tablet Take 2.5 mg by mouth 2 (two) times daily.  Marland Kitchen atorvastatin (LIPITOR) 10 MG tablet Take 1 tablet (10 mg total) by mouth daily.  . benzonatate (TESSALON) 100 MG capsule Take 1 capsule (100 mg total) by mouth 2 (two) times daily as needed for cough.  . ferrous sulfate 325 (65 FE) MG tablet Take 325 mg by mouth daily.   Marland Kitchen glucose blood (ONETOUCH VERIO) test strip USE ONCE DAILY. DX E11.65.  Marland Kitchen insulin glargine, 2 Unit Dial, (TOUJEO MAX SOLOSTAR) 300 UNIT/ML Solostar Pen Inject 10 Units into the skin  daily.  . Insulin Pen Needle (PEN NEEDLES 3/16") 31G X 5 MM MISC To use daily with basal insulin injections  . metoprolol tartrate (LOPRESSOR) 25 MG tablet Take 1 tablet (25 mg total) by mouth 2 (two) times daily.  . pantoprazole (PROTONIX) 40 MG tablet Take 1 tablet (40 mg total) by mouth daily.  Marland Kitchen senna-docusate (SENOKOT-S) 8.6-50 MG tablet Take 1 tablet by mouth at bedtime as needed for mild constipation.  . vitamin B-12 (CYANOCOBALAMIN) 1000 MCG tablet Take 1 tablet (1,000 mcg total) by mouth daily.  . [DISCONTINUED] albuterol (VENTOLIN HFA) 108 (90 Base) MCG/ACT inhaler Inhale 2 puffs into the lungs every 4 (four) hours as needed for wheezing or shortness of breath.   No facility-administered encounter medications on file as of 05/10/2020.    Surgical History: Past Surgical History:  Procedure Laterality Date  . CATARACT EXTRACTION    . COLONOSCOPY WITH PROPOFOL N/A 07/02/2019   Procedure: COLONOSCOPY WITH PROPOFOL;  Surgeon: Lin Landsman, MD;  Location: [facility] ENDOSCOPY;  Service: Gastroenterology;  Laterality: N/A;  . IR CT HEAD LTD  08/27/2019  . IR PERCUTANEOUS ART THROMBECTOMY/INFUSION INTRACRANIAL INC DIAG ANGIO  08/27/2019      . IR PERCUTANEOUS ART THROMBECTOMY/INFUSION INTRACRANIAL INC DIAG ANGIO  08/27/2019  . PROSTATE CANCER    . PROSTATE SURGERY    . RADIOLOGY WITH ANESTHESIA N/A 08/27/2019   Procedure: IR WITH ANESTHESIA;  Surgeon: Luanne Bras, MD;  Location: [facility];  Service: Radiology;  Laterality: N/A;    Medical History: Past Medical History:  Diagnosis Date  . Atrial fibrillation ([place])   . CHF (congestive heart failure) ([place])   . Diabetes ([place])   . Hearing loss   . High blood pressure   . History of bladder problems   . Kidney stones 11/22/2014  . Prostate cancer ([place])   . Prostate cancer ([place])   . Stroke [facility])     Family History: Family History  Problem Relation Age of Onset  . Colon cancer Mother   . Diabetes Other   . High blood pressure Other    . Prostate cancer Brother   . Diabetes Brother     Social History   Socioeconomic History  . Marital status: Married    Spouse name: Berel Najjar  . Number of children: 2  . Years of education: 77  . Highest education level: 12th grade  Occupational History  . Occupation: [employer]  Tobacco Use  . Smoking status: Former Smoker    Years: 16.00    Types: Cigarettes  . Smokeless tobacco: Never Used  Vaping Use  . Vaping Use: Never used  Substance and Sexual Activity  . Alcohol use: No  . Drug use: No  . Sexual activity: Not Currently  Other Topics Concern  . Not on file  Social History Narrative  . Not on file   Social Determinants of Health   Financial Resource Strain: Low Risk   . Difficulty of Paying Living Expenses: Not very hard  Food Insecurity: No Food Insecurity  . Worried About Charity fundraiser in the Last Year: Never true  . Ran Out of Food in the Last Year: Never true  Transportation Needs: No Transportation Needs  . Lack of Transportation (Medical): No  . Lack of Transportation (Non-Medical): No  Physical Activity: Inactive  . Days of Exercise per Week: 0 days  . Minutes of Exercise per Session: 0 min  Stress: No Stress Concern Present  . Feeling of Stress : Only a little  Social Connections: Moderately Integrated  . Frequency of Communication with Friends and Family: More than three times a week  . Frequency of Social Gatherings with Friends and Family: More than three times a week  . Attends Religious Services: More than 4 times per year  . Active Member of Clubs or Organizations: No  . Attends Archivist Meetings: Never  . Marital Status: Married  Human resources officer Violence: Not At Risk  . Fear of Current or Ex-Partner: No  . Emotionally Abused: No  . Physically Abused: No  . Sexually Abused: No      Review of Systems  Constitutional: Negative for chills, fatigue and unexpected weight change.  HENT: Negative for congestion,  postnasal drip, rhinorrhea, sneezing and sore throat.   Eyes: Negative for redness.  Respiratory: Negative for cough, chest tightness and shortness of breath.   Cardiovascular: Negative for chest pain and palpitations.  Gastrointestinal: Negative for abdominal pain, constipation, diarrhea, nausea and vomiting.  Genitourinary: Negative for dysuria and frequency.  Musculoskeletal: Negative for arthralgias, back pain, joint swelling and neck pain.  Skin: Negative for rash.  Neurological: Negative for tremors and numbness.  Hematological: Negative for adenopathy. Does not bruise/bleed easily.  Psychiatric/Behavioral: Negative for behavioral problems (Depression), sleep disturbance and suicidal ideas. The patient is not nervous/anxious.     Vital Signs: BP 140/80   Pulse 67   Temp (!) 96.5 F (35.8 C)   Resp 16   Ht 6' (1.829 m)   Wt 253 lb 9.6 oz (115 kg)  SpO2 98%   BMI 34.39 kg/m    Physical Exam Vitals reviewed.  Constitutional:      Appearance: Normal appearance. He is obese.  Cardiovascular:     Rate and Rhythm: Normal rate and regular rhythm.     Pulses: Normal pulses.     Heart sounds: Normal heart sounds.  Pulmonary:     Effort: Pulmonary effort is normal.     Breath sounds: Normal breath sounds.  Abdominal:     General: Abdomen is flat.     Palpations: Abdomen is soft.  Musculoskeletal:        General: Normal range of motion.     Cervical back: Normal range of motion.  Skin:    General: Skin is warm.  Neurological:     General: No focal deficit present.     Mental Status: He is alert and oriented to person, place, and time. Mental status is at baseline.  Psychiatric:        Mood and Affect: Mood normal.        Behavior: Behavior normal.        Thought Content: Thought content normal.        Judgment: Judgment normal.    Assessment/Plan: 1. Type 2 diabetes mellitus with stage 3b chronic kidney disease, unspecified whether long term insulin use (HCC) A1C  8.5--increase Toujeo to 12 units nightly Will closely monitor - POCT HgB A1C  2. COPD, mild ([place]) Educated on techniques to use types of inhalers Start daily Spiriva and albuterol as needed for shortness of breath or wheezing - Tiotropium Bromide Monohydrate (SPIRIVA RESPIMAT) 2.5 MCG/ACT AERS; Inhale 2 puffs into the lungs daily.  Dispense: 4 g; Refill: 3 - albuterol (VENTOLIN HFA) 108 (90 Base) MCG/ACT inhaler; Inhale 2 puffs into the lungs every 4 (four) hours as needed for wheezing or shortness of breath.  Dispense: 18 g; Refill: 1  3. Essential hypertension BP and HR remain well controlled, continue to monitor  General Counseling: joanna hall understanding of the findings of todays visit and agrees with plan of treatment. I have discussed any further diagnostic evaluation that may be needed or ordered today. We also reviewed his medications today. he has been encouraged to call the office with any questions or concerns that should arise related to todays visit.    Orders Placed This Encounter  Procedures  . POCT HgB A1C  . POCT Urinalysis Dipstick    Meds ordered this encounter  Medications  . Tiotropium Bromide Monohydrate (SPIRIVA RESPIMAT) 2.5 MCG/ACT AERS    Sig: Inhale 2 puffs into the lungs daily.    Dispense:  4 g    Refill:  3  . albuterol (VENTOLIN HFA) 108 (90 Base) MCG/ACT inhaler    Sig: Inhale 2 puffs into the lungs every 4 (four) hours as needed for wheezing or shortness of breath.    Dispense:  18 g    Refill:  1    Time spent: 30 Minutes Time spent includes review of chart, medications, test results and follow-up plan with the patient.  This patient was seen by Theodoro Grist AGNP-C in Collaboration with Dr Lavera Guise as a part of collaborative care agreement     Tanna Furry. Legacy Carrender AGNP-C Internal medicine

## 2020-05-12 ENCOUNTER — Ambulatory Visit: Payer: Medicare Other | Admitting: Hospice and Palliative Medicine

## 2020-05-20 DIAGNOSIS — G4733 Obstructive sleep apnea (adult) (pediatric): Secondary | ICD-10-CM | POA: Diagnosis not present

## 2020-05-22 ENCOUNTER — Other Ambulatory Visit: Payer: Self-pay | Admitting: *Deleted

## 2020-05-22 NOTE — Patient Instructions (Signed)
Low-Sodium Eating Plan Sodium, which is an element that makes up salt, helps you maintain a healthy balance of fluids in your body. Too much sodium can increase your blood pressure and cause fluid and waste to be held in your body. Your health care provider or dietitian may recommend following this plan if you have high blood pressure (hypertension), kidney disease, liver disease, or heart failure. Eating less sodium can help lower your blood pressure, reduce swelling, and protect your heart, liver, and kidneys. What are tips for following this plan? Reading food labels  The Nutrition Facts label lists the amount of sodium in one serving of the food. If you eat more than one serving, you must multiply the listed amount of sodium by the number of servings.  Choose foods with less than 140 mg of sodium per serving.  Avoid foods with 300 mg of sodium or more per serving. Shopping  Look for lower-sodium products, often labeled as "low-sodium" or "no salt added."  Always check the sodium content, even if foods are labeled as "unsalted" or "no salt added."  Buy fresh foods. ? Avoid canned foods and pre-made or frozen meals. ? Avoid canned, cured, or processed meats.  Buy breads that have less than 80 mg of sodium per slice.   Cooking  Eat more home-cooked food and less restaurant, buffet, and fast food.  Avoid adding salt when cooking. Use salt-free seasonings or herbs instead of table salt or sea salt. Check with your health care provider or pharmacist before using salt substitutes.  Cook with plant-based oils, such as canola, sunflower, or olive oil.   Meal planning  When eating at a restaurant, ask that your food be prepared with less salt or no salt, if possible. Avoid dishes labeled as brined, pickled, cured, smoked, or made with soy sauce, miso, or teriyaki sauce.  Avoid foods that contain MSG (monosodium glutamate). MSG is sometimes added to Chinese food, bouillon, and some canned  foods.  Make meals that can be grilled, baked, poached, roasted, or steamed. These are generally made with less sodium. General information Most people on this plan should limit their sodium intake to 1,500-2,000 mg (milligrams) of sodium each day. What foods should I eat? Fruits Fresh, frozen, or canned fruit. Fruit juice. Vegetables Fresh or frozen vegetables. "No salt added" canned vegetables. "No salt added" tomato sauce and paste. Low-sodium or reduced-sodium tomato and vegetable juice. Grains Low-sodium cereals, including oats, puffed wheat and rice, and shredded wheat. Low-sodium crackers. Unsalted rice. Unsalted pasta. Low-sodium bread. Whole-grain breads and whole-grain pasta. Meats and other proteins Fresh or frozen (no salt added) meat, poultry, seafood, and fish. Low-sodium canned tuna and salmon. Unsalted nuts. Dried peas, beans, and lentils without added salt. Unsalted canned beans. Eggs. Unsalted nut butters. Dairy Milk. Soy milk. Cheese that is naturally low in sodium, such as ricotta cheese, fresh mozzarella, or Swiss cheese. Low-sodium or reduced-sodium cheese. Cream cheese. Yogurt. Seasonings and condiments Fresh and dried herbs and spices. Salt-free seasonings. Low-sodium mustard and ketchup. Sodium-free salad dressing. Sodium-free light mayonnaise. Fresh or refrigerated horseradish. Lemon juice. Vinegar. Other foods Homemade, reduced-sodium, or low-sodium soups. Unsalted popcorn and pretzels. Low-salt or salt-free chips. The items listed above may not be a complete list of foods and beverages you can eat. Contact a dietitian for more information. What foods should I avoid? Vegetables Sauerkraut, pickled vegetables, and relishes. Olives. French fries. Onion rings. Regular canned vegetables (not low-sodium or reduced-sodium). Regular canned tomato sauce and paste (not low-sodium   or reduced-sodium). Regular tomato and vegetable juice (not low-sodium or reduced-sodium). Frozen  vegetables in sauces. Grains Instant hot cereals. Bread stuffing, pancake, and biscuit mixes. Croutons. Seasoned rice or pasta mixes. Noodle soup cups. Boxed or frozen macaroni and cheese. Regular salted crackers. Self-rising flour. Meats and other proteins Meat or fish that is salted, canned, smoked, spiced, or pickled. Precooked or cured meat, such as sausages or meat loaves. Bacon. Ham. Pepperoni. Hot dogs. Corned beef. Chipped beef. Salt pork. Jerky. Pickled herring. Anchovies and sardines. Regular canned tuna. Salted nuts. Dairy Processed cheese and cheese spreads. Hard cheeses. Cheese curds. Blue cheese. Feta cheese. String cheese. Regular cottage cheese. Buttermilk. Canned milk. Fats and oils Salted butter. Regular margarine. Ghee. Bacon fat. Seasonings and condiments Onion salt, garlic salt, seasoned salt, table salt, and sea salt. Canned and packaged gravies. Worcestershire sauce. Tartar sauce. Barbecue sauce. Teriyaki sauce. Soy sauce, including reduced-sodium. Steak sauce. Fish sauce. Oyster sauce. Cocktail sauce. Horseradish that you find on the shelf. Regular ketchup and mustard. Meat flavorings and tenderizers. Bouillon cubes. Hot sauce. Pre-made or packaged marinades. Pre-made or packaged taco seasonings. Relishes. Regular salad dressings. Salsa. Other foods Salted popcorn and pretzels. Corn chips and puffs. Potato and tortilla chips. Canned or dried soups. Pizza. Frozen entrees and pot pies. The items listed above may not be a complete list of foods and beverages you should avoid. Contact a dietitian for more information. Summary  Eating less sodium can help lower your blood pressure, reduce swelling, and protect your heart, liver, and kidneys.  Most people on this plan should limit their sodium intake to 1,500-2,000 mg (milligrams) of sodium each day.  Canned, boxed, and frozen foods are high in sodium. Restaurant foods, fast foods, and pizza are also very high in sodium. You  also get sodium by adding salt to food.  Try to cook at home, eat more fresh fruits and vegetables, and eat less fast food and canned, processed, or prepared foods. This information is not intended to replace advice given to you by your health care provider. Make sure you discuss any questions you have with your health care provider. Document Revised: 02/12/2019 Document Reviewed: 12/09/2018 Elsevier Patient Education  2021 Elsevier Inc.  

## 2020-05-22 NOTE — Patient Outreach (Signed)
[facility]) Care Management  05/22/2020  [name] January 19, 1934 [id number]   Outgoing call placed to member/wife, no answer, HIPAA complaint voice message left.  Will follow up within the next 3-4 business days.     Update:  Incoming call received back from wife.  State member is doing about the same, denies he has had any recent issues.  Still working on sticking to low salt diet.  Denies any urgent concerns, encouraged to contact this care manager with questions.  Agrees to follow up within the next month.  Goals Addressed            This Visit's Progress   . [facility] - Make and Keep All Appointments   On track    Timeframe:  Short-Term Goal Priority:  High Start Date:       4/4                      Expected End Date:    5/4                      Barriers: Health Behaviors    - call to cancel if needed - keep a calendar with prescription refill dates    Why is this important?    Part of staying healthy is seeing the doctor for follow-up care.   If you forget your appointments, there are some things you can do to stay on track.    Notes:   4/4 - appointment for PFT on 4/6, oncology on 4/6, and PCP on 4/22  5/2 - Awaiting call back from PCP regarding paperwork for driver's license.  Calendar book sent to member to help keep appointment dates managed    . [facility] - Track and Manage Heart Rate and Rhythm   On track      Timeframe:  Long-Range Goal Priority:  High Start Date:    5/2                 Expected End Date:       7/2  Barriers: Health Behaviors Knowledge           - begin a symptom diary - check pulse (heart) rate before taking medicine - make a plan to exercise regularly - make a plan to eat healthy    Why is this important?   Atrial fibrillation may have no symptoms. Sometimes the symptoms get worse or happen more often.  It is important to keep track of what your symptoms are and when they happen.  A change in symptoms is  important to discuss with your doctor or nurse.  Being active and healthy eating will also help you manage your heart condition.     Notes:   11/24 - Reviewed upcoming appointment with cardiology, encouraged to monitor HR and BP and take trends for review  12/22 - Will send BP monitor to home for daily monitoring  2/2 - Reminded to recording daily readings, will send calendar with logs  4/4 - Trends: 110's-140's/70's  5/2 - Blood pressures range 120's-130's/70's        [name], Therapist, sports, MSN [place] Manager [phone]

## 2020-06-12 ENCOUNTER — Other Ambulatory Visit: Payer: Self-pay | Admitting: Nurse Practitioner

## 2020-06-12 DIAGNOSIS — E1122 Type 2 diabetes mellitus with diabetic chronic kidney disease: Secondary | ICD-10-CM

## 2020-06-15 DIAGNOSIS — I517 Cardiomegaly: Secondary | ICD-10-CM | POA: Diagnosis not present

## 2020-06-15 DIAGNOSIS — I10 Essential (primary) hypertension: Secondary | ICD-10-CM | POA: Diagnosis not present

## 2020-06-15 DIAGNOSIS — I482 Chronic atrial fibrillation, unspecified: Secondary | ICD-10-CM | POA: Diagnosis not present

## 2020-06-15 DIAGNOSIS — I5022 Chronic systolic (congestive) heart failure: Secondary | ICD-10-CM | POA: Diagnosis not present

## 2020-06-15 DIAGNOSIS — E782 Mixed hyperlipidemia: Secondary | ICD-10-CM | POA: Diagnosis not present

## 2020-06-15 DIAGNOSIS — R079 Chest pain, unspecified: Secondary | ICD-10-CM | POA: Diagnosis not present

## 2020-06-15 DIAGNOSIS — I509 Heart failure, unspecified: Secondary | ICD-10-CM | POA: Diagnosis not present

## 2020-06-15 DIAGNOSIS — J9811 Atelectasis: Secondary | ICD-10-CM | POA: Diagnosis not present

## 2020-06-19 DIAGNOSIS — G4733 Obstructive sleep apnea (adult) (pediatric): Secondary | ICD-10-CM | POA: Diagnosis not present

## 2020-06-23 ENCOUNTER — Other Ambulatory Visit: Payer: Self-pay | Admitting: *Deleted

## 2020-06-23 NOTE — Patient Outreach (Signed)
[facility]) Care Management  [place]  06/24/2020   NIVAN MELENDREZ 12/23/1933 [id number]  Outgoing call placed to member and wife, both report he is doing well.  Has follow up with oncology for prostate concern on 6/20.  Was able to get his license renewed, will have to reassess in 6 months.  Denies any urgent concerns, encouraged to contact this care manager with questions.  Encounter Medications:  Outpatient Encounter Medications as of 06/23/2020  Medication Sig  . albuterol (VENTOLIN HFA) 108 (90 Base) MCG/ACT inhaler Inhale 2 puffs into the lungs every 4 (four) hours as needed for wheezing or shortness of breath.  Marland Kitchen apixaban (ELIQUIS) 2.5 MG TABS tablet Take 2.5 mg by mouth 2 (two) times daily.  Marland Kitchen atorvastatin (LIPITOR) 10 MG tablet Take 1 tablet (10 mg total) by mouth daily.  . benzonatate (TESSALON) 100 MG capsule Take 1 capsule (100 mg total) by mouth 2 (two) times daily as needed for cough.  . ferrous sulfate 325 (65 FE) MG tablet Take 325 mg by mouth daily.   Marland Kitchen glucose blood (ONETOUCH VERIO) test strip USE ONCE DAILY. DX E11.65.  Marland Kitchen insulin glargine, 2 Unit Dial, (TOUJEO MAX SOLOSTAR) 300 UNIT/ML Solostar Pen Inject 10 Units into the skin daily.  . Insulin Pen Needle (PEN NEEDLES 3/16") 31G X 5 MM MISC To use daily with basal insulin injections  . metoprolol tartrate (LOPRESSOR) 25 MG tablet Take 1 tablet (25 mg total) by mouth 2 (two) times daily.  . pantoprazole (PROTONIX) 40 MG tablet Take 1 tablet (40 mg total) by mouth daily.  Marland Kitchen senna-docusate (SENOKOT-S) 8.6-50 MG tablet Take 1 tablet by mouth at bedtime as needed for mild constipation.  . Tiotropium Bromide Monohydrate (SPIRIVA RESPIMAT) 2.5 MCG/ACT AERS Inhale 2 puffs into the lungs daily.  . vitamin B-12 (CYANOCOBALAMIN) 1000 MCG tablet Take 1 tablet (1,000 mcg total) by mouth daily.   No facility-administered encounter medications on file as of 06/23/2020.    Functional Status:  In your present  state of health, do you have any difficulty performing the following activities: 10/29/2019 10/15/2019  Hearing? N N  Comment - -  Vision? N N  Difficulty concentrating or making decisions? N N  Walking or climbing stairs? N N  Comment - -  Dressing or bathing? N N  Comment - -  Doing errands, shopping? N N  Preparing Food and eating ? N N  Using the Toilet? N N  In the past six months, have you accidently leaked urine? Y Y  Comment Urinary Incontinence. -  Do you have problems with loss of bowel control? N N  Managing your Medications? N N  Managing your Finances? N N  Housekeeping or managing your Housekeeping? N N  Some recent data might be hidden    Fall/Depression Screening: Fall Risk  03/31/2020 02/04/2020 12/03/2019  Falls in the past year? 0 0 0  Number falls in past yr: - - -  Injury with Fall? - - -  Comment - - -  Risk for fall due to : - - -  Follow up - - -   PHQ 2/9 Scores 03/31/2020 02/04/2020 12/03/2019 11/17/2019 10/29/2019 10/15/2019 09/08/2019  PHQ - 2 Score 0 0 0 0 0 0 0  Exception Documentation - - - - Other- indicate reason in comment box - Other- indicate reason in comment box  Not completed - - - - According to wife and primary caregiver who performed the assessment. -  Wife completed assessment.    Assessment:  Goals Addressed            This Visit's Progress   . COMPLETED: [facility] - Make and Keep All Appointments   On track    Timeframe:  Short-Term Goal Priority:  High Start Date:       4/4                      Expected End Date:    5/4                      Barriers: Health Behaviors    - call to cancel if needed - keep a calendar with prescription refill dates    Why is this important?    Part of staying healthy is seeing the doctor for follow-up care.   If you forget your appointments, there are some things you can do to stay on track.    Notes:   4/4 - appointment for PFT on 4/6, oncology on 4/6, and PCP on 4/22  5/2 - Awaiting call  back from PCP regarding paperwork for driver's license.  Calendar book sent to member to help keep appointment dates managed  6/3 - Cardiology complete on 5/26, PCP scheduled for 7/21    . [facility] - Track and Manage Heart Rate and Rhythm   On track      Timeframe:  Long-Range Goal Priority:  High Start Date:    5/2                 Expected End Date:       7/2  Barriers: Health Behaviors Knowledge           - begin a symptom diary - check pulse (heart) rate before taking medicine - make a plan to exercise regularly - make a plan to eat healthy    Why is this important?   Atrial fibrillation may have no symptoms. Sometimes the symptoms get worse or happen more often.  It is important to keep track of what your symptoms are and when they happen.  A change in symptoms is important to discuss with your doctor or nurse.  Being active and healthy eating will also help you manage your heart condition.     Notes:   11/24 - Reviewed upcoming appointment with cardiology, encouraged to monitor HR and BP and take trends for review  12/22 - Will send BP monitor to home for daily monitoring  2/2 - Reminded to recording daily readings, will send calendar with logs  4/4 - Trends: 110's-140's/70's  5/2 - Blood pressures range 120's-130's/70's  6/3 - Cardiology appointment complete on 5/26.  Report he has been monitoring blood pressure and HR, difficult to read his own handwriting.  Encouraged to have wife help with documenting trends.  Most recent readings were 135/85 HR 64 and 136/88 HR 71        Plan:  Follow-up:  Patient agrees to Care Plan and Follow-up.  Will follow up within the next month.  [name], [place], MSN [place] [phone]

## 2020-06-30 ENCOUNTER — Other Ambulatory Visit: Payer: Self-pay | Admitting: Nurse Practitioner

## 2020-07-07 ENCOUNTER — Ambulatory Visit: Payer: Medicare Other | Admitting: Oncology

## 2020-07-07 ENCOUNTER — Ambulatory Visit: Payer: Medicare Other

## 2020-07-07 ENCOUNTER — Other Ambulatory Visit: Payer: Medicare Other

## 2020-07-10 ENCOUNTER — Other Ambulatory Visit: Payer: Self-pay

## 2020-07-10 ENCOUNTER — Inpatient Hospital Stay: Payer: Medicare Other

## 2020-07-10 ENCOUNTER — Encounter: Payer: Self-pay | Admitting: Oncology

## 2020-07-10 ENCOUNTER — Inpatient Hospital Stay: Payer: Medicare Other | Attending: Oncology | Admitting: Oncology

## 2020-07-10 VITALS — BP 155/74 | HR 97 | Temp 97.5°F | Resp 18 | Wt 251.6 lb

## 2020-07-10 DIAGNOSIS — Z79899 Other long term (current) drug therapy: Secondary | ICD-10-CM | POA: Diagnosis not present

## 2020-07-10 DIAGNOSIS — C61 Malignant neoplasm of prostate: Secondary | ICD-10-CM

## 2020-07-10 DIAGNOSIS — C772 Secondary and unspecified malignant neoplasm of intra-abdominal lymph nodes: Secondary | ICD-10-CM | POA: Diagnosis not present

## 2020-07-10 DIAGNOSIS — Z79818 Long term (current) use of other agents affecting estrogen receptors and estrogen levels: Secondary | ICD-10-CM

## 2020-07-10 DIAGNOSIS — K7689 Other specified diseases of liver: Secondary | ICD-10-CM | POA: Insufficient documentation

## 2020-07-10 DIAGNOSIS — R590 Localized enlarged lymph nodes: Secondary | ICD-10-CM | POA: Insufficient documentation

## 2020-07-10 DIAGNOSIS — Z9079 Acquired absence of other genital organ(s): Secondary | ICD-10-CM | POA: Insufficient documentation

## 2020-07-10 LAB — COMPREHENSIVE METABOLIC PANEL
ALT: 18 U/L (ref 0–44)
AST: 19 U/L (ref 15–41)
Albumin: 3.6 g/dL (ref 3.5–5.0)
Alkaline Phosphatase: 104 U/L (ref 38–126)
Anion gap: 10 (ref 5–15)
BUN: 28 mg/dL — ABNORMAL HIGH (ref 8–23)
CO2: 27 mmol/L (ref 22–32)
Calcium: 9.1 mg/dL (ref 8.9–10.3)
Chloride: 101 mmol/L (ref 98–111)
Creatinine, Ser: 1.88 mg/dL — ABNORMAL HIGH (ref 0.61–1.24)
GFR, Estimated: 35 mL/min — ABNORMAL LOW (ref >60)
Glucose, Bld: 248 mg/dL — ABNORMAL HIGH (ref 70–99)
Potassium: 4.3 mmol/L (ref 3.5–5.1)
Sodium: 138 mmol/L (ref 135–145)
Total Bilirubin: 0.8 mg/dL (ref 0.3–1.2)
Total Protein: 7.4 g/dL (ref 6.5–8.1)

## 2020-07-10 LAB — PSA: Prostatic Specific Antigen: 47.37 ng/mL — ABNORMAL HIGH (ref 0.00–4.00)

## 2020-07-10 LAB — CBC WITH DIFFERENTIAL/PLATELET
Abs Immature Granulocytes: 0.03 10*3/uL (ref 0.00–0.07)
Basophils Absolute: 0 10*3/uL (ref 0.0–0.1)
Basophils Relative: 1 %
Eosinophils Absolute: 0.1 10*3/uL (ref 0.0–0.5)
Eosinophils Relative: 2 %
HCT: 43.5 % (ref 39.0–52.0)
Hemoglobin: 13.7 g/dL (ref 13.0–17.0)
Immature Granulocytes: 1 %
Lymphocytes Relative: 24 %
Lymphs Abs: 1.4 10*3/uL (ref 0.7–4.0)
MCH: 29 pg (ref 26.0–34.0)
MCHC: 31.5 g/dL (ref 30.0–36.0)
MCV: 92.2 fL (ref 80.0–100.0)
Monocytes Absolute: 0.6 10*3/uL (ref 0.1–1.0)
Monocytes Relative: 10 %
Neutro Abs: 3.7 10*3/uL (ref 1.7–7.7)
Neutrophils Relative %: 62 %
Platelets: 164 10*3/uL (ref 150–400)
RBC: 4.72 MIL/uL (ref 4.22–5.81)
RDW: 14.9 % (ref 11.5–15.5)
WBC: 6 10*3/uL (ref 4.0–10.5)
nRBC: 0 % (ref 0.0–0.2)

## 2020-07-10 MED ORDER — LEUPROLIDE ACETATE (6 MONTH) 45 MG ~~LOC~~ KIT
45.0000 mg | PACK | Freq: Once | SUBCUTANEOUS | Status: AC
Start: 2020-07-10 — End: 2020-07-10
  Administered 2020-07-10: 45 mg via SUBCUTANEOUS
  Filled 2020-07-10: qty 45

## 2020-07-10 NOTE — Progress Notes (Signed)
Pt here for follow up. No new concerns voiced.   
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
## 2020-07-13 ENCOUNTER — Ambulatory Visit: Payer: Medicare Other | Admitting: Podiatry

## 2020-07-13 ENCOUNTER — Telehealth: Payer: Self-pay | Admitting: *Deleted

## 2020-07-13 NOTE — Telephone Encounter (Signed)
"  His sugar, A1C was high.  So, he's not feeling his best.  He canceled the appointment.  We'll call back to schedule another appointment."  I  canceled the appointment.

## 2020-07-20 DIAGNOSIS — G4733 Obstructive sleep apnea (adult) (pediatric): Secondary | ICD-10-CM | POA: Diagnosis not present

## 2020-07-28 ENCOUNTER — Other Ambulatory Visit: Payer: Self-pay | Admitting: *Deleted

## 2020-07-28 NOTE — Patient Outreach (Signed)
[facility]) Care Management  07/28/2020  [name] Dec 29, 1933 [id number]   Outgoing call placed to member/wife, no answer, unable to leave voice message.  Will follow up within the next 3-4 business days.  [name], [place], MSN [place] [phone]

## 2020-08-01 ENCOUNTER — Ambulatory Visit (INDEPENDENT_AMBULATORY_CARE_PROVIDER_SITE_OTHER): Payer: Medicare Other | Admitting: Vascular Surgery

## 2020-08-01 ENCOUNTER — Other Ambulatory Visit (INDEPENDENT_AMBULATORY_CARE_PROVIDER_SITE_OTHER): Payer: Medicare Other

## 2020-08-02 ENCOUNTER — Other Ambulatory Visit: Payer: Self-pay | Admitting: *Deleted

## 2020-08-02 NOTE — Patient Outreach (Addendum)
[facility]) Care Management  08/02/2020  DEYON CHIZEK [birth year] [id number]   Outreach attempt #2, unsuccessful, HIPAA compliant voice message left.  Will send outreach letter and follow up within the next 3-4 business days.    Update:  Incoming call received back from wife.  State member has continued to do well.  He is still working daily despite being encouraged to decrease hours.  Denies any urgent concerns, encouraged to contact this care manager with questions.  Agrees to follow up within the next month.   Goals Addressed             This Visit's Progress    [facility] - Make and Keep All Appointments   On track    Timeframe:  Short-Term Goal Priority:  High Start Date:       7/13                    Expected End Date:   10/13  Barriers: Health Behaviors    - call to cancel if needed - keep a calendar with prescription refill dates    Why is this important?   Part of staying healthy is seeing the doctor for follow-up care.  If you forget your appointments, there are some things you can do to stay on track.    Notes:   4/4 - appointment for PFT on 4/6, oncology on 4/6, and PCP on 4/22  5/2 - Awaiting call back from PCP regarding paperwork for driver's license.  Calendar book sent to member to help keep appointment dates managed  6/3 - Cardiology complete on 5/26, PCP scheduled for 7/21  7/13 - Goal reactivated - Reviewed upcoming appointments with wife, has PCP scheduled for 7/29 and neurology on 8/29      [facility] - Track and Manage Heart Rate and Rhythm   On track      Timeframe:  Long-Range Goal Priority:  High Start Date:    5/2                 Expected End Date:       11/2  Barriers: Health Behaviors Knowledge           - begin a symptom diary - check pulse (heart) rate before taking medicine - make a plan to exercise regularly - make a plan to eat healthy    Why is this important?   Atrial fibrillation may have no symptoms.  Sometimes the symptoms get worse or happen more often.  It is important to keep track of what your symptoms are and when they happen.  A change in symptoms is important to discuss with your doctor or nurse.  Being active and healthy eating will also help you manage your heart condition.     Notes:   11/24 - Reviewed upcoming appointment with cardiology, encouraged to monitor HR and BP and take trends for review  12/22 - Will send BP monitor to home for daily monitoring  2/2 - Reminded to recording daily readings, will send calendar with logs  4/4 - Trends: 110's-140's/70's  5/2 - Blood pressures range 120's-130's/70's  6/3 - Cardiology appointment complete on 5/26.  Report he has been monitoring blood pressure and HR, difficult to read his own handwriting.  Encouraged to have wife help with documenting trends.  Most recent readings were 135/85 HR 64 and 136/88 HR 71  7/13 - Patient unavailable to provide specific readings, wife report all numbers have been "good"  Kourosh Jablonsky, RN, MSN [facility] Care Management  Community Care Manager [phone]  

## 2020-08-04 ENCOUNTER — Other Ambulatory Visit: Payer: Self-pay | Admitting: Nurse Practitioner

## 2020-08-10 ENCOUNTER — Ambulatory Visit: Payer: Medicare Other | Admitting: Internal Medicine

## 2020-08-18 ENCOUNTER — Encounter: Payer: Self-pay | Admitting: Nurse Practitioner

## 2020-08-18 ENCOUNTER — Other Ambulatory Visit: Payer: Self-pay

## 2020-08-18 ENCOUNTER — Ambulatory Visit (INDEPENDENT_AMBULATORY_CARE_PROVIDER_SITE_OTHER): Payer: Medicare Other | Admitting: Physician Assistant

## 2020-08-18 DIAGNOSIS — I4891 Unspecified atrial fibrillation: Secondary | ICD-10-CM | POA: Diagnosis not present

## 2020-08-18 DIAGNOSIS — N1832 Chronic kidney disease, stage 3b: Secondary | ICD-10-CM

## 2020-08-18 DIAGNOSIS — C61 Malignant neoplasm of prostate: Secondary | ICD-10-CM | POA: Diagnosis not present

## 2020-08-18 DIAGNOSIS — I10 Essential (primary) hypertension: Secondary | ICD-10-CM | POA: Diagnosis not present

## 2020-08-18 DIAGNOSIS — E1122 Type 2 diabetes mellitus with diabetic chronic kidney disease: Secondary | ICD-10-CM | POA: Diagnosis not present

## 2020-08-18 DIAGNOSIS — E66811 Obesity, class 1: Secondary | ICD-10-CM

## 2020-08-18 DIAGNOSIS — Z794 Long term (current) use of insulin: Secondary | ICD-10-CM

## 2020-08-18 DIAGNOSIS — E669 Obesity, unspecified: Secondary | ICD-10-CM

## 2020-08-18 LAB — POCT GLYCOSYLATED HEMOGLOBIN (HGB A1C): Hemoglobin A1C: 8.4 % — AB (ref 4.0–5.6)

## 2020-08-18 MED ORDER — DAPAGLIFLOZIN PROPANEDIOL 10 MG PO TABS: 10.0000 mg | ORAL_TABLET | Freq: Every day | ORAL | 2 refills | Status: DC

## 2020-08-18 MED ORDER — AMLODIPINE BESYLATE 2.5 MG PO TABS: 2.5000 mg | ORAL_TABLET | Freq: Every day | ORAL | 2 refills | Status: DC

## 2020-08-18 MED ORDER — ATORVASTATIN CALCIUM 10 MG PO TABS: 10.0000 mg | ORAL_TABLET | Freq: Every day | ORAL | 1 refills | Status: DC

## 2020-08-18 NOTE — Progress Notes (Signed)
[facility] [place], [place] [address]  Internal MEDICINE  Office Visit Note  Patient Name: [name]  [id number]  [id number]  Date of Service: 08/18/2020  Chief Complaint  Patient presents with   Diabetes   Hypertension    HPI Pt is here for routine follow up -reports his BG readings are 150s-190 fasting -he has been watching what he eats and wife reports he has Baked or boiled chicken, no added salt, lean hamburger, stewed beef, and only occasionally bojoangles as their cheat day. He does consume sugary drinks nad will work on limiting these and increasing water intake -He is currently taking 10 units of Toujeo first thing in the AM. He previously tried [place] for awhile, but was stopped due topossible side effects? Though he doesn't remember what those were. He is willnig to start another oral medication due to [place] A1c today -2 sample boxes of 5mg  farxiga given and will take 1 tab per day. Script for increasing to 10mg  sent to pharmacy to be started once out of samples if he tolerates it well. He was given instructions to take this with breakfast daily. -BP at home 811-572 systolic, however elevated more so in office. Will add low dose amlodipine since he is only on metoprolol currently  Current Medication: Outpatient Encounter Medications as of 08/18/2020  Medication Sig   albuterol (VENTOLIN HFA) 108 (90 Base) MCG/ACT inhaler Inhale 2 puffs into the lungs every 4 (four) hours as needed for wheezing or shortness of breath.   amLODipine (NORVASC) 2.5 MG tablet Take 1 tablet (2.5 mg total) by mouth daily.   apixaban (ELIQUIS) 2.5 MG TABS tablet Take 2.5 mg by mouth 2 (two) times daily.   benzonatate (TESSALON) 100 MG capsule Take 1 capsule (100 mg total) by mouth 2 (two) times daily as needed for cough.   dapagliflozin propanediol (FARXIGA) 10 MG TABS tablet Take 1 tablet (10 mg total) by mouth daily before breakfast.   ferrous sulfate 325  (65 FE) MG tablet Take 325 mg by mouth daily.    glucose blood (ONETOUCH VERIO) test strip USE ONCE DAILY. DX E11.65.   insulin glargine, 2 Unit Dial, (TOUJEO MAX SOLOSTAR) 300 UNIT/ML Solostar Pen Inject 10 Units into the skin daily.   Insulin Pen Needle (PEN NEEDLES 3/16") 31G X 5 MM MISC To use daily with basal insulin injections   metoprolol tartrate (LOPRESSOR) 25 MG tablet Take 1 tablet (25 mg total) by mouth 2 (two) times daily.   pantoprazole (PROTONIX) 40 MG tablet Take 1 tablet (40 mg total) by mouth daily.   senna-docusate (SENOKOT-S) 8.6-50 MG tablet Take 1 tablet by mouth at bedtime as needed for mild constipation.   Tiotropium Bromide Monohydrate (SPIRIVA RESPIMAT) 2.5 MCG/ACT AERS Inhale 2 puffs into the lungs daily.   vitamin B-12 (CYANOCOBALAMIN) 1000 MCG tablet Take 1 tablet (1,000 mcg total) by mouth daily.   [DISCONTINUED] atorvastatin (LIPITOR) 10 MG tablet Take 1 tablet (10 mg total) by mouth daily.   atorvastatin (LIPITOR) 10 MG tablet Take 1 tablet (10 mg total) by mouth daily.   No facility-administered encounter medications on file as of 08/18/2020.    Surgical History: Past Surgical History:  Procedure Laterality Date   CATARACT EXTRACTION     COLONOSCOPY WITH PROPOFOL N/A 07/02/2019   Procedure: COLONOSCOPY WITH PROPOFOL;  Surgeon: Lin Landsman, MD;  Location: [facility] END[facility]COPY;  Service: Gastroenterology;  Laterality: N/A;   IR CT HEAD LTD  08/27/2019   IR  PERCUTANEOUS ART THROMBECTOMY/INFUSION INTRACRANIAL INC DIAG ANGIO  08/27/2019       IR PERCUTANEOUS ART THROMBECTOMY/INFUSION INTRACRANIAL INC DIAG ANGIO  08/27/2019   PROSTATE CANCER     PROSTATE SURGERY     RADIOLOGY WITH ANESTHESIA N/A 08/27/2019   Procedure: IR WITH ANESTHESIA;  Surgeon: Luanne Bras, MD;  Location: [place];  Service: Radiology;  Laterality: N/A;    Medical History: Past Medical History:  Diagnosis Date   Atrial fibrillation (HCC)    CHF (congestive heart failure) (HCC)     Diabetes ([place])    Hearing loss    High blood pressure    History of bladder problems    Kidney stones 11/22/2014   Prostate cancer ([place])    Prostate cancer ([place])    Stroke ([place])     Family History: Family History  Problem Relation Age of Onset   Colon cancer Mother    Diabetes Other    High blood pressure Other    Prostate cancer Brother    Diabetes Brother     Social History   Socioeconomic History   Marital status: Married    Spouse name: Dijuan Sleeth   Number of children: 2   Years of education: 12   Highest education level: 12th grade  Occupational History   Occupation: [employer]  Tobacco Use   Smoking status: Former    Years: 16.00    Types: Cigarettes   Smokeless tobacco: Never  Vaping Use   Vaping Use: Never used  Substance and Sexual Activity   Alcohol use: No   Drug use: No   Sexual activity: Not Currently  Other Topics Concern   Not on file  Social History Narrative   Not on file   Social Determinants of Health   Financial Resource Strain: Low Risk    Difficulty of Paying Living Expenses: Not very hard  Food Insecurity: No Food Insecurity   Worried About Charity fundraiser in the Last Year: Never true   [place] in the Last Year: Never true  Transportation Needs: No Transportation Needs   Lack of Transportation (Medical): No   Lack of Transportation (Non-Medical): No  Physical Activity: Inactive   Days of Exercise per Week: 0 days   Minutes of Exercise per Session: 0 min  Stress: No Stress Concern Present   Feeling of Stress : Only a little  Social Connections: Moderately Integrated   Frequency of Communication with Friends and Family: More than three times a week   Frequency of Social Gatherings with Friends and Family: More than three times a week   Attends Religious Services: More than 4 times per year   Active Member of [employer] or Organizations: No   Attends Archivist Meetings: Never   Marital Status: Married   Human resources officer Violence: Not At Risk   Fear of Current or Ex-Partner: No   Emotionally Abused: No   Physically Abused: No   Sexually Abused: No      Review of Systems  Constitutional:  Negative for chills, fatigue and unexpected weight change.  HENT:  Negative for congestion, postnasal drip, rhinorrhea, sneezing and sore throat.   Eyes:  Negative for redness.  Respiratory:  Negative for cough, chest tightness and shortness of breath.   Cardiovascular:  Negative for chest pain and palpitations.  Gastrointestinal:  Negative for abdominal pain, constipation, diarrhea, nausea and vomiting.  Genitourinary:  Negative for dysuria and frequency.  Musculoskeletal:  Negative for arthralgias, back pain, joint swelling  and neck pain.  Skin:  Negative for rash.  Neurological: Negative.  Negative for tremors and numbness.  Hematological:  Negative for adenopathy. Does not bruise/bleed easily.  Psychiatric/Behavioral:  Negative for behavioral problems (Depression), sleep disturbance and suicidal ideas. The patient is not nervous/anxious.    Vital Signs: BP (!) 148/90   Pulse 69   Temp 98.1 F (36.7 C)   Resp 16   Ht 6' (1.829 m)   Wt 256 lb (116.1 kg)   SpO2 99%   BMI 34.72 kg/m    Physical Exam Vitals and nursing note reviewed.  Constitutional:      General: He is not in acute distress.    Appearance: He is well-developed. He is obese. He is not diaphoretic.  HENT:     Head: Normocephalic and atraumatic.     Mouth/Throat:     Pharynx: No oropharyngeal exudate.  Eyes:     Pupils: Pupils are equal, round, and reactive to light.  Neck:     Thyroid: No thyromegaly.     Vascular: No JVD.     Trachea: No tracheal deviation.  Cardiovascular:     Rate and Rhythm: Normal rate and regular rhythm.     Heart sounds: Normal heart sounds. No murmur heard.   No friction rub. No gallop.  Pulmonary:     Effort: Pulmonary effort is normal. No respiratory distress.     Breath sounds: No  wheezing or rales.  Chest:     Chest wall: No tenderness.  Abdominal:     General: Bowel sounds are normal.     Palpations: Abdomen is soft.  Musculoskeletal:        General: Normal range of motion.     Cervical back: Normal range of motion and neck supple.  Lymphadenopathy:     Cervical: No cervical adenopathy.  Skin:    General: Skin is warm and dry.  Neurological:     Mental Status: He is alert and oriented to person, place, and time.     Cranial Nerves: No cranial nerve deficit.  Psychiatric:        Behavior: Behavior normal.        Thought Content: Thought content normal.        Judgment: Judgment normal.       Assessment/Plan: 1. Type 2 diabetes mellitus with stage 3b chronic kidney disease, with long-term current use of insulin (HCC) - POCT HgB A1C is 8.4 today which is only slightly improved from 8.5 at last visit.  We will continue on 10 units of Toujeo.  Add [place] starting with 5 mg samples for 2 weeks and then increasing to 10 mg prescription if tolerated.  We will continue to monitor blood sugars closely and continue to work on improving diet and exercise, especially reducing the number of sugary drinks and increasing water intake - dapagliflozin propanediol (FARXIGA) 10 MG TABS tablet; Take 1 tablet (10 mg total) by mouth daily before breakfast.  Dispense: 30 tablet; Refill: 2  2. Essential hypertension Mildly elevated in office, will continue metoprolol twice daily and add low-dose Norvasc.  Patient instructed to monitor blood pressure at home - amLODipine (NORVASC) 2.5 MG tablet; Take 1 tablet (2.5 mg total) by mouth daily.  Dispense: 30 tablet; Refill: 2  3. Atrial fibrillation with RVR ([place]) Followed by cardiology, on Eliquis  4. Prostate cancer ([place]) Followed by oncology  5. Obesity (BMI 30.0-34.9) Obesity Counseling: Had a lengthy discussion regarding patients BMI and weight issues. Patient was instructed  on portion control as well as increased activity.  Also discussed caloric restrictions with trying to maintain intake less than 2000 Kcal. Discussions were made in accordance with the 5As of weight management. Simple actions such as not eating late and if able to, taking a walk is suggested.    General Counseling: bradrick kamau understanding of the findings of todays visit and agrees with plan of treatment. I have discussed any further diagnostic evaluation that may be needed or ordered today. We also reviewed his medications today. he has been encouraged to call the office with any questions or concerns that should arise related to todays visit.    Orders Placed This Encounter  Procedures   POCT HgB A1C    Meds ordered this encounter  Medications   atorvastatin (LIPITOR) 10 MG tablet    Sig: Take 1 tablet (10 mg total) by mouth daily.    Dispense:  90 tablet    Refill:  1   amLODipine (NORVASC) 2.5 MG tablet    Sig: Take 1 tablet (2.5 mg total) by mouth daily.    Dispense:  30 tablet    Refill:  2   dapagliflozin propanediol (FARXIGA) 10 MG TABS tablet    Sig: Take 1 tablet (10 mg total) by mouth daily before breakfast.    Dispense:  30 tablet    Refill:  2    This patient was seen by Drema Dallas, PA-C in collaboration with Dr. Clayborn Bigness as a part of collaborative care agreement.   Total time spent:35 Minutes Time spent includes review of chart, medications, test results, and follow up plan with the patient.      Dr Lavera Guise Internal medicine

## 2020-08-19 DIAGNOSIS — G4733 Obstructive sleep apnea (adult) (pediatric): Secondary | ICD-10-CM | POA: Diagnosis not present

## 2020-08-30 ENCOUNTER — Other Ambulatory Visit: Payer: Self-pay | Admitting: *Deleted

## 2020-08-30 NOTE — Patient Outreach (Addendum)
[facility]) Care Management  08/30/2020  KAMRON VANWYHE 02-24-1933 [id number]   Call placed to member/wife, no answer, HIPAA compliant voice message left.  Will follow up within the next 3-5 business days.     Update:  Wife returned call to this care manager.  State member is having intermittent pain, concerned if this is related to his cancer, will discuss with oncology.  Otherwise state he is doing well, currently at work.  Denies any urgent concerns, encouraged to contact this care manager with questions.  Agrees to follow up within the next month.    Goals Addressed             This Visit's Progress    [facility] - Make and Keep All Appointments   On track    Timeframe:  Short-Term Goal Priority:  High Start Date:       7/13                    Expected End Date:   10/13  Barriers: Health Behaviors    - call to cancel if needed - keep a calendar with prescription refill dates    Why is this important?   Part of staying healthy is seeing the doctor for follow-up care.  If you forget your appointments, there are some things you can do to stay on track.    Notes:   4/4 - appointment for PFT on 4/6, oncology on 4/6, and PCP on 4/22  5/2 - Awaiting call back from PCP regarding paperwork for driver's license.  Calendar book sent to member to help keep appointment dates managed  6/3 - Cardiology complete on 5/26, PCP scheduled for 7/21  7/13 - Goal reactivated - Reviewed upcoming appointments with wife, has PCP scheduled for 7/29 and neurology on 8/29  8/10 - Reviewed AVS from recent PCP appointment.  Will follow up on 8/29 to assess glucose and BP changes after medications were added.  Will also have visit with neurology on 8/29 and oncology on 9/21     [facility] - Track and Manage Heart Rate and Rhythm   On track      Timeframe:  Long-Range Goal Priority:  High Start Date:    5/2                 Expected End Date:       11/2  Barriers: Health  Behaviors Knowledge           - begin a symptom diary - check pulse (heart) rate before taking medicine - make a plan to exercise regularly - make a plan to eat healthy    Why is this important?   Atrial fibrillation may have no symptoms. Sometimes the symptoms get worse or happen more often.  It is important to keep track of what your symptoms are and when they happen.  A change in symptoms is important to discuss with your doctor or nurse.  Being active and healthy eating will also help you manage your heart condition.     Notes:   11/24 - Reviewed upcoming appointment with cardiology, encouraged to monitor HR and BP and take trends for review  12/22 - Will send BP monitor to home for daily monitoring  2/2 - Reminded to recording daily readings, will send calendar with logs  4/4 - Trends: 110's-140's/70's  5/2 - Blood pressures range 120's-130's/70's  6/3 - Cardiology appointment complete on 5/26.  Report he has been monitoring blood  pressure and HR, difficult to read his own handwriting.  Encouraged to have wife help with documenting trends.  Most recent readings were 135/85 HR 64 and 136/88 HR 71  7/13 - Patient unavailable to provide specific readings, wife report all numbers have been "good"  8/10 - Reviewed medication added to regime with wife Wilder Glade and Amlodipine), report member has been taking them and recording glucose and BP/HR.  She is unable to find his notebook, member is not currently home.  She will check with him and call this care manager back with details.        [name], [place], MSN [place] [phone]

## 2020-09-06 MED ORDER — PHENYLEPHRINE HCL (PRESSORS) 10 MG/ML IV SOLN
INTRAVENOUS | Status: AC
  Filled 2020-09-06: qty 1

## 2020-09-06 MED ORDER — PROPOFOL 500 MG/50ML IV EMUL
INTRAVENOUS | Status: AC
  Filled 2020-09-06: qty 100

## 2020-09-06 MED ORDER — PROPOFOL 500 MG/50ML IV EMUL
INTRAVENOUS | Status: AC
  Filled 2020-09-06: qty 400

## 2020-09-06 MED ORDER — PROPOFOL 500 MG/50ML IV EMUL
INTRAVENOUS | Status: AC
  Filled 2020-09-06: qty 50

## 2020-09-06 MED ORDER — OXYMETAZOLINE HCL 0.05 % NA SOLN
NASAL | Status: AC
  Filled 2020-09-06: qty 30

## 2020-09-08 ENCOUNTER — Other Ambulatory Visit: Payer: Self-pay

## 2020-09-08 MED ORDER — METOPROLOL TARTRATE 25 MG PO TABS: 25.0000 mg | ORAL_TABLET | Freq: Two times a day (BID) | ORAL | 1 refills | Status: DC

## 2020-09-18 ENCOUNTER — Ambulatory Visit: Payer: Medicare Other | Admitting: Adult Health

## 2020-09-18 ENCOUNTER — Ambulatory Visit: Payer: Medicare Other | Admitting: Physician Assistant

## 2020-09-19 DIAGNOSIS — G4733 Obstructive sleep apnea (adult) (pediatric): Secondary | ICD-10-CM | POA: Diagnosis not present

## 2020-09-28 ENCOUNTER — Other Ambulatory Visit: Payer: Self-pay | Admitting: *Deleted

## 2020-09-28 NOTE — Patient Outreach (Signed)
[facility]) Care Management  09/28/2020  JESSE NOSBISCH December 04, 1933 [id number]   Outgoing call placed to member/wife, no answer, HIPAA compliant voice message left.  Will follow up within the next 3-4 business days.  [name], [place], MSN [place] [phone]

## 2020-09-29 ENCOUNTER — Ambulatory Visit: Payer: Medicare Other | Admitting: Nurse Practitioner

## 2020-10-03 ENCOUNTER — Other Ambulatory Visit: Payer: Self-pay | Admitting: *Deleted

## 2020-10-03 NOTE — Patient Outreach (Signed)
[facility]) Care Management  [place]  10/03/2020   [name] 1933/08/11 [id number]  Incoming call received back from member's wife.  State he is "about the same."  Continues to work, recently had a brother to pass away.  Advised that grief counseling is available if needed.  Denies any urgent concerns, encouraged to contact this care manager with questions.    Encounter Medications:  Outpatient Encounter Medications as of 10/03/2020  Medication Sig   albuterol (VENTOLIN HFA) 108 (90 Base) MCG/ACT inhaler Inhale 2 puffs into the lungs every 4 (four) hours as needed for wheezing or shortness of breath.   amLODipine (NORVASC) 2.5 MG tablet Take 1 tablet (2.5 mg total) by mouth daily.   apixaban (ELIQUIS) 2.5 MG TABS tablet Take 2.5 mg by mouth 2 (two) times daily.   atorvastatin (LIPITOR) 10 MG tablet Take 1 tablet (10 mg total) by mouth daily.   benzonatate (TESSALON) 100 MG capsule Take 1 capsule (100 mg total) by mouth 2 (two) times daily as needed for cough.   dapagliflozin propanediol (FARXIGA) 10 MG TABS tablet Take 1 tablet (10 mg total) by mouth daily before breakfast.   ferrous sulfate 325 (65 FE) MG tablet Take 325 mg by mouth daily.    glucose blood (ONETOUCH VERIO) test strip USE ONCE DAILY. DX E11.65.   insulin glargine, 2 Unit Dial, (TOUJEO MAX SOLOSTAR) 300 UNIT/ML Solostar Pen Inject 10 Units into the skin daily.   Insulin Pen Needle (PEN NEEDLES 3/16") 31G X 5 MM MISC To use daily with basal insulin injections   metoprolol tartrate (LOPRESSOR) 25 MG tablet Take 1 tablet (25 mg total) by mouth 2 (two) times daily.   pantoprazole (PROTONIX) 40 MG tablet Take 1 tablet (40 mg total) by mouth daily.   senna-docusate (SENOKOT-S) 8.6-50 MG tablet Take 1 tablet by mouth at bedtime as needed for mild constipation.   Tiotropium Bromide Monohydrate (SPIRIVA RESPIMAT) 2.5 MCG/ACT AERS Inhale 2 puffs into the lungs daily.   vitamin B-12 (CYANOCOBALAMIN)  1000 MCG tablet Take 1 tablet (1,000 mcg total) by mouth daily.   No facility-administered encounter medications on file as of 10/03/2020.    Functional Status:  In your present state of health, do you have any difficulty performing the following activities: 10/29/2019 10/15/2019  Hearing? N N  Vision? N N  Difficulty concentrating or making decisions? N N  Walking or climbing stairs? N N  Dressing or bathing? N N  Doing errands, shopping? N N  Preparing Food and eating ? N N  Using the Toilet? N N  In the past six months, have you accidently leaked urine? Y Y  Comment Urinary Incontinence. -  Do you have problems with loss of bowel control? N N  Managing your Medications? N N  Managing your Finances? N N  Housekeeping or managing your Housekeeping? N N  Some recent data might be hidden    Fall/Depression Screening: Fall Risk  03/31/2020 02/04/2020 12/03/2019  Falls in the past year? 0 0 0  Number falls in past yr: - - -  Injury with Fall? - - -  Comment - - -  Risk for fall due to : - - -  Follow up - - -   PHQ 2/9 Scores 03/31/2020 02/04/2020 12/03/2019 11/17/2019 10/29/2019 10/15/2019 09/08/2019  PHQ - 2 Score 0 0 0 0 0 0 0  Exception Documentation - - - - Other- indicate reason in comment box - Other- indicate reason  in comment box  Not completed - - - - According to wife and primary caregiver who performed the assessment. - Wife completed assessment.    Assessment:   Care Plan     Goals Addressed             This Visit's Progress    [facility] - Make and Keep All Appointments   Not on track    Timeframe:  Short-Term Goal Priority:  High Start Date:       7/13                    Expected End Date:   10/13  Barriers: Health Behaviors    - call to cancel if needed - keep a calendar with prescription refill dates    Why is this important?   Part of staying healthy is seeing the doctor for follow-up care.  If you forget your appointments, there are some things you  can do to stay on track.    Notes:   4/4 - appointment for PFT on 4/6, oncology on 4/6, and PCP on 4/22  5/2 - Awaiting call back from PCP regarding paperwork for driver's license.  Calendar book sent to member to help keep appointment dates managed  6/3 - Cardiology complete on 5/26, PCP scheduled for 7/21  7/13 - Goal reactivated - Reviewed upcoming appointments with wife, has PCP scheduled for 7/29 and neurology on 8/29  8/10 - Reviewed AVS from recent PCP appointment.  Will follow up on 8/29 to assess glucose and BP changes after medications were added.  Will also have visit with neurology on 8/29 and oncology on 9/21  9/13 - Per wife, she and member canceled both PCP and neurology appointments on 8/29 as member was not feeling well.  Advised of importance of keeping and attending appointments.  Has lab work on 9/19, oncology on 9/21, and AWV with PCP on 9/26.  Offered to call Neurology office to reschedule, wife declined.  Provided with office number, encouraged to call as soon as possible.     [facility] - Track and Manage Heart Rate and Rhythm   On track      Timeframe:  Long-Range Goal Priority:  High Start Date:    5/2                 Expected End Date:       11/2  Barriers: Health Behaviors Knowledge           - begin a symptom diary - check pulse (heart) rate before taking medicine - make a plan to exercise regularly - make a plan to eat healthy    Why is this important?   Atrial fibrillation may have no symptoms. Sometimes the symptoms get worse or happen more often.  It is important to keep track of what your symptoms are and when they happen.  A change in symptoms is important to discuss with your doctor or nurse.  Being active and healthy eating will also help you manage your heart condition.     Notes:   11/24 - Reviewed upcoming appointment with cardiology, encouraged to monitor HR and BP and take trends for review  12/22 - Will send BP monitor to home for daily  monitoring  2/2 - Reminded to recording daily readings, will send calendar with logs  4/4 - Trends: 110's-140's/70's  5/2 - Blood pressures range 120's-130's/70's  6/3 - Cardiology appointment complete on 5/26.  Report he has been  monitoring blood pressure and HR, difficult to read his own handwriting.  Encouraged to have wife help with documenting trends.  Most recent readings were 135/85 HR 64 and 136/88 HR 71  7/13 - Patient unavailable to provide specific readings, wife report all numbers have been "good"  8/10 - Reviewed medication added to regime with wife Wilder Glade and Amlodipine), report member has been taking them and recording glucose and BP/HR.  She is unable to find his notebook, member is not currently home.  She will check with him and call this care manager back with details.  9/13 - Blood pressures and blood sugars reportedly remain stable, range 130-140's/80s, HR 60-70's, CBG range 90-less than 200.        Plan:  Follow-up: Patient agrees to Care Plan and Follow-up. Follow-up in 1 month(s).  [name], [place], MSN [place] [phone]

## 2020-10-03 NOTE — Patient Outreach (Addendum)
[facility]) Care Management  10/03/2020  IZEN PETZ Nov 27, 1933 [id number]   Outreach attempt #2, unsuccessful, HIPAA compliant voice message left.  Will send outreach letter and follow up within the next 3-4 business days.  [name], [place], MSN [place] [phone]

## 2020-10-06 ENCOUNTER — Other Ambulatory Visit: Payer: Self-pay

## 2020-10-06 ENCOUNTER — Ambulatory Visit (INDEPENDENT_AMBULATORY_CARE_PROVIDER_SITE_OTHER): Payer: Medicare Other | Admitting: Nurse Practitioner

## 2020-10-06 ENCOUNTER — Encounter: Payer: Self-pay | Admitting: Nurse Practitioner

## 2020-10-06 VITALS — BP 138/72 | HR 72 | Temp 98.0°F | Resp 16 | Ht 72.0 in | Wt 253.6 lb

## 2020-10-06 DIAGNOSIS — I10 Essential (primary) hypertension: Secondary | ICD-10-CM | POA: Diagnosis not present

## 2020-10-06 DIAGNOSIS — N1832 Chronic kidney disease, stage 3b: Secondary | ICD-10-CM | POA: Diagnosis not present

## 2020-10-06 DIAGNOSIS — H6123 Impacted cerumen, bilateral: Secondary | ICD-10-CM

## 2020-10-06 DIAGNOSIS — Z23 Encounter for immunization: Secondary | ICD-10-CM

## 2020-10-06 DIAGNOSIS — E1122 Type 2 diabetes mellitus with diabetic chronic kidney disease: Secondary | ICD-10-CM

## 2020-10-06 DIAGNOSIS — Z794 Long term (current) use of insulin: Secondary | ICD-10-CM | POA: Diagnosis not present

## 2020-10-06 NOTE — Progress Notes (Signed)
[facility] [place], [place] [address]  Internal MEDICINE  Office Visit Note  Patient Name: [name]  [id number]  [id number]  Date of Service: 10/06/2020  Chief Complaint  Patient presents with   Follow-up    Ears need cleaned   Diabetes   Hypertension    HPI Pate presents for a follow up visit for hypertension and diabetes. He is also requesting that his ears be cleaned. His blood pressure is well controlled. His A1C only improved by 0.1 and is still elevated at 8.4 when it was checked on 7/29.  He reports that his diet has been poor and he needs to work on it. He was also started on farxiga at his last office visit and amlodipine for his BP.      Current Medication: Outpatient Encounter Medications as of 10/06/2020  Medication Sig   albuterol (VENTOLIN HFA) 108 (90 Base) MCG/ACT inhaler Inhale 2 puffs into the lungs every 4 (four) hours as needed for wheezing or shortness of breath.   amLODipine (NORVASC) 2.5 MG tablet Take 1 tablet (2.5 mg total) by mouth daily.   apixaban (ELIQUIS) 2.5 MG TABS tablet Take 2.5 mg by mouth 2 (two) times daily.   atorvastatin (LIPITOR) 10 MG tablet Take 1 tablet (10 mg total) by mouth daily.   benzonatate (TESSALON) 100 MG capsule Take 1 capsule (100 mg total) by mouth 2 (two) times daily as needed for cough.   dapagliflozin propanediol (FARXIGA) 10 MG TABS tablet Take 1 tablet (10 mg total) by mouth daily before breakfast.   ferrous sulfate 325 (65 FE) MG tablet Take 325 mg by mouth daily.    glucose blood (ONETOUCH VERIO) test strip USE ONCE DAILY. DX E11.65.   insulin glargine, 2 Unit Dial, (TOUJEO MAX SOLOSTAR) 300 UNIT/ML Solostar Pen Inject 10 Units into the skin daily.   Insulin Pen Needle (PEN NEEDLES 3/16") 31G X 5 MM MISC To use daily with basal insulin injections   metoprolol tartrate (LOPRESSOR) 25 MG tablet Take 1 tablet (25 mg total) by mouth 2 (two) times daily.   pantoprazole (PROTONIX) 40 MG  tablet Take 1 tablet (40 mg total) by mouth daily.   pneumococcal 20-Val Conj Vacc (PREVNAR 20) 0.5 ML injection Inject 0.5 mLs into the muscle tomorrow at 10 am.   senna-docusate (SENOKOT-S) 8.6-50 MG tablet Take 1 tablet by mouth at bedtime as needed for mild constipation.   Tiotropium Bromide Monohydrate (SPIRIVA RESPIMAT) 2.5 MCG/ACT AERS Inhale 2 puffs into the lungs daily.   vitamin B-12 (CYANOCOBALAMIN) 1000 MCG tablet Take 1 tablet (1,000 mcg total) by mouth daily.   Zoster Vaccine Adjuvanted [facility]) injection Inject into the muscle once.   No facility-administered encounter medications on file as of 10/06/2020.    Surgical History: Past Surgical History:  Procedure Laterality Date   CATARACT EXTRACTION     COLONOSCOPY WITH PROPOFOL N/A 07/02/2019   Procedure: COLONOSCOPY WITH PROPOFOL;  Surgeon: Lin Landsman, MD;  Location: [facility] ENDOSCOPY;  Service: Gastroenterology;  Laterality: N/A;   IR CT HEAD LTD  08/27/2019   IR PERCUTANEOUS ART THROMBECTOMY/INFUSION INTRACRANIAL INC DIAG ANGIO  08/27/2019       IR PERCUTANEOUS ART THROMBECTOMY/INFUSION INTRACRANIAL INC DIAG ANGIO  08/27/2019   PROSTATE CANCER     PROSTATE SURGERY     RADIOLOGY WITH ANESTHESIA N/A 08/27/2019   Procedure: IR WITH ANESTHESIA;  Surgeon: Luanne Bras, MD;  Location: [place];  Service: Radiology;  Laterality: N/A;  Medical History: Past Medical History:  Diagnosis Date   Atrial fibrillation (HCC)    CHF (congestive heart failure) (HCC)    Diabetes ([place])    Hearing loss    High blood pressure    History of bladder problems    Kidney stones 11/22/2014   Prostate cancer ([place])    Prostate cancer ([place])    Stroke ([place])     Family History: Family History  Problem Relation Age of Onset   Colon cancer Mother    Diabetes Other    High blood pressure Other    Prostate cancer Brother    Diabetes Brother     Social History   Socioeconomic History   Marital status: Married    Spouse name:  Macgregor Aeschliman   Number of children: 2   Years of education: 12   Highest education level: 12th grade  Occupational History   Occupation: [employer]  Tobacco Use   Smoking status: Former    Years: 16.00    Types: Cigarettes   Smokeless tobacco: Never  Vaping Use   Vaping Use: Never used  Substance and Sexual Activity   Alcohol use: No   Drug use: No   Sexual activity: Not Currently  Other Topics Concern   Not on file  Social History Narrative   Not on file   Social Determinants of Health   Financial Resource Strain: Low Risk    Difficulty of Paying Living Expenses: Not very hard  Food Insecurity: No Food Insecurity   Worried About Charity fundraiser in the Last Year: Never true   [place] in the Last Year: Never true  Transportation Needs: No Transportation Needs   Lack of Transportation (Medical): No   Lack of Transportation (Non-Medical): No  Physical Activity: Inactive   Days of Exercise per Week: 0 days   Minutes of Exercise per Session: 0 min  Stress: No Stress Concern Present   Feeling of Stress : Only a little  Social Connections: Moderately Integrated   Frequency of Communication with Friends and Family: More than three times a week   Frequency of Social Gatherings with Friends and Family: More than three times a week   Attends Religious Services: More than 4 times per year   Active Member of [employer] or Organizations: No   Attends Archivist Meetings: Never   Marital Status: Married  Human resources officer Violence: Not At Risk   Fear of Current or Ex-Partner: No   Emotionally Abused: No   Physically Abused: No   Sexually Abused: No      Review of Systems  Constitutional:  Negative for chills, fatigue and unexpected weight change.  HENT:  Negative for congestion, rhinorrhea, sneezing and sore throat.   Eyes:  Negative for redness.  Respiratory:  Negative for cough, chest tightness and shortness of breath.   Cardiovascular:  Negative for chest  pain and palpitations.  Gastrointestinal:  Negative for abdominal pain, constipation, diarrhea, nausea and vomiting.  Genitourinary:  Negative for dysuria and frequency.  Musculoskeletal:  Negative for arthralgias, back pain, joint swelling and neck pain.  Skin:  Negative for rash.  Neurological: Negative.  Negative for tremors and numbness.  Hematological:  Negative for adenopathy. Does not bruise/bleed easily.  Psychiatric/Behavioral:  Negative for behavioral problems (Depression), sleep disturbance and suicidal ideas. The patient is not nervous/anxious.    Vital Signs: BP 138/72   Pulse 72   Temp 98 F (36.7 C)   Resp 16  Ht 6' (1.829 m)   Wt 253 lb 9.6 oz (115 kg)   SpO2 97%   BMI 34.39 kg/m    Physical Exam Vitals reviewed.  Constitutional:      General: He is not in acute distress.    Appearance: Normal appearance. He is obese. He is not ill-appearing.  HENT:     Head: Normocephalic and atraumatic.     Right Ear: Ear canal and external ear normal. There is impacted cerumen (clear with ear lavage).     Left Ear: Ear canal and external ear normal. There is impacted cerumen (cleared with ear lavage).  Eyes:     Extraocular Movements: Extraocular movements intact.     Pupils: Pupils are equal, round, and reactive to light.  Cardiovascular:     Rate and Rhythm: Normal rate and regular rhythm.  Pulmonary:     Effort: Pulmonary effort is normal. No respiratory distress.  Neurological:     Mental Status: He is alert and oriented to person, place, and time.  Psychiatric:        Mood and Affect: Mood normal.        Behavior: Behavior normal.       Assessment/Plan: 1. Bilateral impacted cerumen Cleared with ear lavage bilaterally. - Ear Lavage  2. Type 2 diabetes mellitus with stage 3b chronic kidney disease, with long-term current use of insulin ([place]) Repeat A1C in october  3. Essential hypertension Stable with current medication, continue as prescribed  4.  Needs flu shot Administered in office tday - Flu Vaccine MDCK QUAD PF   General Counseling: Bearett verbalizes understanding of the findings of todays visit and agrees with plan of treatment. I have discussed any further diagnostic evaluation that may be needed or ordered today. We also reviewed his medications today. he has been encouraged to call the office with any questions or concerns that should arise related to todays visit.    Orders Placed This Encounter  Procedures   Flu Vaccine MDCK QUAD PF   Ear Lavage    No orders of the defined types were placed in this encounter.   Return in about 6 weeks (around 11/17/2020) for F/U, Recheck A1C, [place] Sitts PCP.   Total time spent:20 Minutes Time spent includes review of chart, medications, test results, and follow up plan with the patient.   [place] Controlled Substance Database was reviewed by me.  This patient was seen by Jonetta Osgood, FNP-C in collaboration with Dr. Clayborn Bigness as a part of collaborative care agreement.   Mauria Asquith R. Valetta Fuller, MSN, FNP-C Internal medicine

## 2020-10-09 ENCOUNTER — Inpatient Hospital Stay: Payer: Medicare Other | Attending: Oncology

## 2020-10-09 ENCOUNTER — Other Ambulatory Visit: Payer: Self-pay

## 2020-10-09 DIAGNOSIS — Z7901 Long term (current) use of anticoagulants: Secondary | ICD-10-CM | POA: Diagnosis not present

## 2020-10-09 DIAGNOSIS — Z9079 Acquired absence of other genital organ(s): Secondary | ICD-10-CM | POA: Diagnosis not present

## 2020-10-09 DIAGNOSIS — C61 Malignant neoplasm of prostate: Secondary | ICD-10-CM | POA: Diagnosis present

## 2020-10-09 DIAGNOSIS — C772 Secondary and unspecified malignant neoplasm of intra-abdominal lymph nodes: Secondary | ICD-10-CM | POA: Diagnosis not present

## 2020-10-09 DIAGNOSIS — I4891 Unspecified atrial fibrillation: Secondary | ICD-10-CM | POA: Diagnosis not present

## 2020-10-09 DIAGNOSIS — Z79899 Other long term (current) drug therapy: Secondary | ICD-10-CM | POA: Diagnosis not present

## 2020-10-09 DIAGNOSIS — Z794 Long term (current) use of insulin: Secondary | ICD-10-CM | POA: Diagnosis not present

## 2020-10-09 DIAGNOSIS — E119 Type 2 diabetes mellitus without complications: Secondary | ICD-10-CM | POA: Diagnosis not present

## 2020-10-09 LAB — CBC WITH DIFFERENTIAL/PLATELET
Abs Immature Granulocytes: 0.02 10*3/uL (ref 0.00–0.07)
Basophils Absolute: 0 10*3/uL (ref 0.0–0.1)
Basophils Relative: 1 %
Eosinophils Absolute: 0.2 10*3/uL (ref 0.0–0.5)
Eosinophils Relative: 4 %
HCT: 45.8 % (ref 39.0–52.0)
Hemoglobin: 14.5 g/dL (ref 13.0–17.0)
Immature Granulocytes: 0 %
Lymphocytes Relative: 28 %
Lymphs Abs: 1.8 10*3/uL (ref 0.7–4.0)
MCH: 29.1 pg (ref 26.0–34.0)
MCHC: 31.7 g/dL (ref 30.0–36.0)
MCV: 91.8 fL (ref 80.0–100.0)
Monocytes Absolute: 0.8 10*3/uL (ref 0.1–1.0)
Monocytes Relative: 11 %
Neutro Abs: 3.8 10*3/uL (ref 1.7–7.7)
Neutrophils Relative %: 56 %
Platelets: 173 10*3/uL (ref 150–400)
RBC: 4.99 MIL/uL (ref 4.22–5.81)
RDW: 14.8 % (ref 11.5–15.5)
WBC: 6.6 10*3/uL (ref 4.0–10.5)
nRBC: 0 % (ref 0.0–0.2)

## 2020-10-09 LAB — COMPREHENSIVE METABOLIC PANEL
ALT: 23 U/L (ref 0–44)
AST: 23 U/L (ref 15–41)
Albumin: 3.9 g/dL (ref 3.5–5.0)
Alkaline Phosphatase: 116 U/L (ref 38–126)
Anion gap: 9 (ref 5–15)
BUN: 31 mg/dL — ABNORMAL HIGH (ref 8–23)
CO2: 26 mmol/L (ref 22–32)
Calcium: 9.2 mg/dL (ref 8.9–10.3)
Chloride: 103 mmol/L (ref 98–111)
Creatinine, Ser: 2.11 mg/dL — ABNORMAL HIGH (ref 0.61–1.24)
GFR, Estimated: 30 mL/min — ABNORMAL LOW (ref >60)
Glucose, Bld: 193 mg/dL — ABNORMAL HIGH (ref 70–99)
Potassium: 3.9 mmol/L (ref 3.5–5.1)
Sodium: 138 mmol/L (ref 135–145)
Total Bilirubin: 0.4 mg/dL (ref 0.3–1.2)
Total Protein: 7.9 g/dL (ref 6.5–8.1)

## 2020-10-09 LAB — PSA: Prostatic Specific Antigen: 56.82 ng/mL — ABNORMAL HIGH (ref 0.00–4.00)

## 2020-10-11 ENCOUNTER — Encounter: Payer: Self-pay | Admitting: Oncology

## 2020-10-11 ENCOUNTER — Inpatient Hospital Stay (HOSPITAL_BASED_OUTPATIENT_CLINIC_OR_DEPARTMENT_OTHER): Payer: Medicare Other | Admitting: Oncology

## 2020-10-11 ENCOUNTER — Other Ambulatory Visit: Payer: Self-pay

## 2020-10-11 VITALS — BP 115/72 | HR 65 | Temp 96.9°F | Resp 18 | Wt 251.1 lb

## 2020-10-11 DIAGNOSIS — R590 Localized enlarged lymph nodes: Secondary | ICD-10-CM

## 2020-10-11 DIAGNOSIS — C772 Secondary and unspecified malignant neoplasm of intra-abdominal lymph nodes: Secondary | ICD-10-CM | POA: Diagnosis not present

## 2020-10-11 DIAGNOSIS — Z794 Long term (current) use of insulin: Secondary | ICD-10-CM | POA: Diagnosis not present

## 2020-10-11 DIAGNOSIS — Z7901 Long term (current) use of anticoagulants: Secondary | ICD-10-CM | POA: Diagnosis not present

## 2020-10-11 DIAGNOSIS — E119 Type 2 diabetes mellitus without complications: Secondary | ICD-10-CM | POA: Diagnosis not present

## 2020-10-11 DIAGNOSIS — C61 Malignant neoplasm of prostate: Secondary | ICD-10-CM

## 2020-10-11 DIAGNOSIS — I4891 Unspecified atrial fibrillation: Secondary | ICD-10-CM | POA: Diagnosis not present

## 2020-10-11 DIAGNOSIS — Z79899 Other long term (current) drug therapy: Secondary | ICD-10-CM | POA: Diagnosis not present
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
## 2020-10-11 NOTE — Progress Notes (Signed)
1 

## 2020-10-16 ENCOUNTER — Ambulatory Visit: Payer: Medicare Other | Admitting: Nurse Practitioner

## 2020-10-20 DIAGNOSIS — G4733 Obstructive sleep apnea (adult) (pediatric): Secondary | ICD-10-CM | POA: Diagnosis not present

## 2020-11-01 DIAGNOSIS — I5032 Chronic diastolic (congestive) heart failure: Secondary | ICD-10-CM | POA: Diagnosis not present

## 2020-11-01 DIAGNOSIS — I482 Chronic atrial fibrillation, unspecified: Secondary | ICD-10-CM | POA: Diagnosis not present

## 2020-11-01 DIAGNOSIS — E782 Mixed hyperlipidemia: Secondary | ICD-10-CM | POA: Diagnosis not present

## 2020-11-01 DIAGNOSIS — R600 Localized edema: Secondary | ICD-10-CM | POA: Diagnosis not present

## 2020-11-01 DIAGNOSIS — G4733 Obstructive sleep apnea (adult) (pediatric): Secondary | ICD-10-CM | POA: Diagnosis not present

## 2020-11-01 DIAGNOSIS — I10 Essential (primary) hypertension: Secondary | ICD-10-CM | POA: Diagnosis not present

## 2020-11-01 DIAGNOSIS — R079 Chest pain, unspecified: Secondary | ICD-10-CM | POA: Diagnosis not present

## 2020-11-02 ENCOUNTER — Other Ambulatory Visit: Payer: Self-pay | Admitting: Physician Assistant

## 2020-11-02 ENCOUNTER — Other Ambulatory Visit: Payer: Self-pay | Admitting: *Deleted

## 2020-11-02 NOTE — Patient Outreach (Signed)
[facility] [facility]) Care Management  11/02/2020  KERY BATZEL 23-Nov-1933 [id number]   Outgoing call placed to member/wife.  They both state he is doing well currently.  Wife voices feelings of being overwhelmed with management of member's conditions, state she will discuss with daughter in law.  Advised to have daughter in law call this RNCM if needed for collaboration of care.  Denies any urgent concerns, encouraged to contact this care manager with questions.  Agrees to follow up within the next month.   Goals Addressed             This Visit's Progress    [facility] - Make and Keep All Appointments   On track    Timeframe:  Short-Term Goal Priority:  High Start Date:       10/13                  Expected End Date:   02/02/2021 (restarted as member has many appointments added to schedule)  Barriers: Health Behaviors Knowledge    - call to cancel if needed - keep a calendar with prescription refill dates    Why is this important?   Part of staying healthy is seeing the doctor for follow-up care.  If you forget your appointments, there are some things you can do to stay on track.    Notes:   4/4 - appointment for PFT on 4/6, oncology on 4/6, and PCP on 4/22  5/2 - Awaiting call back from PCP regarding paperwork for driver's license.  Calendar book sent to member to help keep appointment dates managed  6/3 - Cardiology complete on 5/26, PCP scheduled for 7/21  7/13 - Goal reactivated - Reviewed upcoming appointments with wife, has PCP scheduled for 7/29 and neurology on 8/29  8/10 - Reviewed AVS from recent PCP appointment.  Will follow up on 8/29 to assess glucose and BP changes after medications were added.  Will also have visit with neurology on 8/29 and oncology on 9/21  9/13 - Per wife, she and member canceled both PCP and neurology appointments on 8/29 as member was not feeling well.  Advised of importance of keeping and attending appointments.  Has lab work  on 9/19, oncology on 9/21, and AWV with PCP on 9/26.  Offered to call Neurology office to reschedule, wife declined.  Provided with office number, encouraged to call as soon as possible.  10/13 - Was seen by PCP on 9/16, follow up on 10/28.  Seen by oncology on 9/21, advised that prostate numbers are increasing indicating cancer reoccurence (increased from 87 in March to 56 last month).  Will have scans on 12/16 and follow with oncology afterwards.  Call placed to neurology office, appointment scheduled for 11/3.     [facility] - Set My Target A1C-Diabetes Type 2       Timeframe:  Long-Range Goal Priority:  Medium Start Date:     10/13                        Expected End Date:     05/03/2021         Barriers: Health Behaviors Knowledge            Follow Up Date 12/01/2020    - set target A1C - </=7 (currently 8.4)    Why is this important?   Your target A1C is decided together by you and your doctor.  It is based on several things  like your age and other health issues.    Notes:   10/13 - CBG range 140-170's.  Educated on proper diabetes diet, education mailed to member and wife     [facility] - Track and Manage Heart Rate and Rhythm   On track      Timeframe:  Long-Range Goal Priority:  High Start Date:    5/2                 Expected End Date:       11/2  Barriers: Health Behaviors Knowledge           - begin a symptom diary - check pulse (heart) rate before taking medicine - make a plan to exercise regularly - make a plan to eat healthy    Why is this important?   Atrial fibrillation may have no symptoms. Sometimes the symptoms get worse or happen more often.  It is important to keep track of what your symptoms are and when they happen.  A change in symptoms is important to discuss with your doctor or nurse.  Being active and healthy eating will also help you manage your heart condition.     Notes:   11/24 - Reviewed upcoming appointment with cardiology, encouraged to  monitor HR and BP and take trends for review  12/22 - Will send BP monitor to home for daily monitoring  2/2 - Reminded to recording daily readings, will send calendar with logs  4/4 - Trends: 110's-140's/70's  5/2 - Blood pressures range 120's-130's/70's  6/3 - Cardiology appointment complete on 5/26.  Report he has been monitoring blood pressure and HR, difficult to read his own handwriting.  Encouraged to have wife help with documenting trends.  Most recent readings were 135/85 HR 64 and 136/88 HR 71  7/13 - Patient unavailable to provide specific readings, wife report all numbers have been "good"  8/10 - Reviewed medication added to regime with wife Wilder Glade and Amlodipine), report member has been taking them and recording glucose and BP/HR.  She is unable to find his notebook, member is not currently home.  She will check with him and call this care manager back with details.  9/13 - Blood pressures and blood sugars reportedly remain stable, range 130-140's/80s, HR 60-70's, CBG range 90-less than 200.  10/13 - Patient report blood pressure and HR has remained stable (range 110-140/70-80, HR range 60-70s).  Was seen by cardiology on 10/12 for complaints of chest discomfort, placed on Holter monitor for 5 days.  Will follow once monitor is returned       [name], Therapist, sports, MSN [place] Manager [phone]

## 2020-11-03 ENCOUNTER — Ambulatory Visit: Payer: Self-pay | Admitting: *Deleted

## 2020-11-07 ENCOUNTER — Other Ambulatory Visit: Payer: Self-pay | Admitting: Physician Assistant

## 2020-11-07 DIAGNOSIS — Z794 Long term (current) use of insulin: Secondary | ICD-10-CM

## 2020-11-07 DIAGNOSIS — I10 Essential (primary) hypertension: Secondary | ICD-10-CM

## 2020-11-16 DIAGNOSIS — I482 Chronic atrial fibrillation, unspecified: Secondary | ICD-10-CM | POA: Diagnosis not present

## 2020-11-17 ENCOUNTER — Other Ambulatory Visit: Payer: Self-pay

## 2020-11-17 ENCOUNTER — Ambulatory Visit (INDEPENDENT_AMBULATORY_CARE_PROVIDER_SITE_OTHER): Payer: Medicare Other | Admitting: Nurse Practitioner

## 2020-11-17 ENCOUNTER — Encounter: Payer: Self-pay | Admitting: Nurse Practitioner

## 2020-11-17 VITALS — BP 140/80 | HR 60 | Temp 98.1°F | Resp 16 | Ht 72.0 in | Wt 253.0 lb

## 2020-11-17 DIAGNOSIS — N1832 Chronic kidney disease, stage 3b: Secondary | ICD-10-CM

## 2020-11-17 DIAGNOSIS — E1122 Type 2 diabetes mellitus with diabetic chronic kidney disease: Secondary | ICD-10-CM

## 2020-11-17 DIAGNOSIS — I700 Atherosclerosis of aorta: Secondary | ICD-10-CM

## 2020-11-17 DIAGNOSIS — I5022 Chronic systolic (congestive) heart failure: Secondary | ICD-10-CM

## 2020-11-17 DIAGNOSIS — I10 Essential (primary) hypertension: Secondary | ICD-10-CM | POA: Diagnosis not present

## 2020-11-17 DIAGNOSIS — Z794 Long term (current) use of insulin: Secondary | ICD-10-CM | POA: Diagnosis not present

## 2020-11-17 LAB — POCT GLYCOSYLATED HEMOGLOBIN (HGB A1C): Hemoglobin A1C: 8.3 % — AB (ref 4.0–5.6)

## 2020-11-17 MED ORDER — TOUJEO MAX SOLOSTAR 300 UNIT/ML ~~LOC~~ SOPN
14.0000 [IU] | PEN_INJECTOR | Freq: Every day | SUBCUTANEOUS | 3 refills | Status: DC
Start: 2020-11-17 — End: 2020-12-27

## 2020-11-17 MED ORDER — "PEN NEEDLES 3/16"" 31G X 5 MM MISC": 3 refills | Status: DC

## 2020-11-17 NOTE — Progress Notes (Signed)
[facility] [place], [place] [address]  Internal MEDICINE  Office Visit Note  Patient Name: [name]  [id number]  [id number]  Date of Service: 11/17/2020  Chief Complaint  Patient presents with   Follow-up   Diabetes   Hypertension    HPI [name] presents for a follow up visit for diabetes and hypertension. His A1C is 8.3 today which is slightly improved from 8.4 in July. He is taking toujeo basal insulin and farxiga for diabetes.  His blood pressure is stable today. He is taking metoprolol 25 mg twice daily and amlodipine 2.5 mg daily.  Toujeo is expensive for the patient as he is in the "donut hole" right now. He is requesting sample if possible.    Current Medication: Outpatient Encounter Medications as of 11/17/2020  Medication Sig   albuterol (VENTOLIN HFA) 108 (90 Base) MCG/ACT inhaler Inhale 2 puffs into the lungs every 4 (four) hours as needed for wheezing or shortness of breath.   amLODipine (NORVASC) 2.5 MG tablet TAKE 1 TABLET(2.5 MG) BY MOUTH DAILY   apixaban (ELIQUIS) 2.5 MG TABS tablet Take 2.5 mg by mouth 2 (two) times daily.   FARXIGA 10 MG TABS tablet TAKE 1 TABLET(10 MG) BY MOUTH DAILY BEFORE BREAKFAST   ferrous sulfate 325 (65 FE) MG tablet Take 325 mg by mouth daily.    pneumococcal 20-Val Conj Vacc (PREVNAR 20) 0.5 ML injection Inject 0.5 mLs into the muscle tomorrow at 10 am.   senna-docusate (SENOKOT-S) 8.6-50 MG tablet Take 1 tablet by mouth at bedtime as needed for mild constipation.   Tiotropium Bromide Monohydrate (SPIRIVA RESPIMAT) 2.5 MCG/ACT AERS Inhale 2 puffs into the lungs daily.   Zoster Vaccine Adjuvanted [facility]) injection Inject into the muscle once.   [DISCONTINUED] atorvastatin (LIPITOR) 10 MG tablet Take 1 tablet (10 mg total) by mouth daily.   [DISCONTINUED] glucose blood (ONETOUCH VERIO) test strip USE ONCE DAILY. DX E11.65.   [DISCONTINUED] insulin glargine, 2 Unit Dial, (TOUJEO MAX SOLOSTAR) 300  UNIT/ML Solostar Pen Inject 10 Units into the skin daily.   [DISCONTINUED] Insulin Pen Needle (PEN NEEDLES 3/16") 31G X 5 MM MISC To use daily with basal insulin injections   [DISCONTINUED] metoprolol tartrate (LOPRESSOR) 25 MG tablet TAKE 1 TABLET(25 MG) BY MOUTH TWICE DAILY   [DISCONTINUED] pantoprazole (PROTONIX) 40 MG tablet Take 1 tablet (40 mg total) by mouth daily.   [DISCONTINUED] vitamin B-12 (CYANOCOBALAMIN) 1000 MCG tablet Take 1 tablet (1,000 mcg total) by mouth daily.   insulin glargine, 2 Unit Dial, (TOUJEO MAX SOLOSTAR) 300 UNIT/ML Solostar Pen Inject 14 Units into the skin daily.   Insulin Pen Needle (PEN NEEDLES 3/16") 31G X 5 MM MISC To use daily with basal insulin injections   [DISCONTINUED] benzonatate (TESSALON) 100 MG capsule Take 1 capsule (100 mg total) by mouth 2 (two) times daily as needed for cough. (Patient not taking: Reported on 11/17/2020)   No facility-administered encounter medications on file as of 11/17/2020.    Surgical History: Past Surgical History:  Procedure Laterality Date   CATARACT EXTRACTION     COLONOSCOPY WITH PROPOFOL N/A 07/02/2019   Procedure: COLONOSCOPY WITH PROPOFOL;  Surgeon: Lin Landsman, MD;  Location[facility] ENDOSCOPY;  Service: Gastroenterology;  Laterality: N/A;   IR CT HEAD LTD  08/27/2019   IR PERCUTANEOUS ART THROMBECTOMY/INFUSION INTRACRANIAL INC DIAG ANGIO  08/27/2019       IR PERCUTANEOUS ART THROMBECTOMY/INFUSION INTRACRANIAL INC DIAG ANGIO  08/27/2019   PROSTATE CANCER  PROSTATE SURGERY     RADIOLOGY WITH ANESTHESIA N/A 08/27/2019   Procedure: IR WITH ANESTHESIA;  Surgeon: Luanne Bras, MD;  Location: [place];  Service: Radiology;  Laterality: N/A;    Medical History: Past Medical History:  Diagnosis Date   Atrial fibrillation (HCC)    CHF (congestive heart failure) (HCC)    Diabetes ([place])    Hearing loss    High blood pressure    History of bladder problems    Kidney stones 11/22/2014   Prostate cancer ([place])     Prostate cancer ([place])    Stroke ([place])     Family History: Family History  Problem Relation Age of Onset   Colon cancer Mother    Diabetes Other    High blood pressure Other    Prostate cancer Brother    Diabetes Brother     Social History   Socioeconomic History   Marital status: Married    Spouse name: Eashan Schipani   Number of children: 2   Years of education: 12   Highest education level: 12th grade  Occupational History   Occupation: [employer]  Tobacco Use   Smoking status: Former    Years: 16.00    Types: Cigarettes   Smokeless tobacco: Never  Vaping Use   Vaping Use: Never used  Substance and Sexual Activity   Alcohol use: No   Drug use: No   Sexual activity: Not Currently  Other Topics Concern   Not on file  Social History Narrative   Not on file   Social Determinants of Health   Financial Resource Strain: Not on file  Food Insecurity: Not on file  Transportation Needs: Not on file  Physical Activity: Not on file  Stress: Not on file  Social Connections: Not on file  Intimate Partner Violence: Not on file      Review of Systems  Constitutional:  Negative for chills, fatigue and unexpected weight change.  HENT:  Negative for congestion, rhinorrhea, sneezing and sore throat.   Eyes:  Negative for redness.  Respiratory:  Negative for cough, chest tightness and shortness of breath.   Cardiovascular:  Negative for chest pain and palpitations.  Gastrointestinal:  Negative for abdominal pain, constipation, diarrhea, nausea and vomiting.  Genitourinary:  Negative for dysuria and frequency.  Musculoskeletal:  Negative for arthralgias, back pain, joint swelling and neck pain.  Skin:  Negative for rash.  Neurological: Negative.  Negative for tremors and numbness.  Hematological:  Negative for adenopathy. Does not bruise/bleed easily.  Psychiatric/Behavioral:  Negative for behavioral problems (Depression), sleep disturbance and suicidal ideas. The  patient is not nervous/anxious.    Vital Signs: BP 140/80   Pulse 60   Temp 98.1 F (36.7 C)   Resp 16   Ht 6' (1.829 m)   Wt 253 lb (114.8 kg)   SpO2 98%   BMI 34.31 kg/m    Physical Exam Vitals reviewed.  Constitutional:      General: He is not in acute distress.    Appearance: Normal appearance. He is obese. He is not ill-appearing.  HENT:     Head: Normocephalic and atraumatic.  Eyes:     Pupils: Pupils are equal, round, and reactive to light.  Cardiovascular:     Rate and Rhythm: Normal rate and regular rhythm.  Pulmonary:     Effort: Pulmonary effort is normal. No respiratory distress.  Neurological:     Mental Status: He is alert and oriented to person, place, and time.  Cranial Nerves: No cranial nerve deficit.     Coordination: Coordination normal.     Gait: Gait normal.  Psychiatric:        Mood and Affect: Mood normal.        Behavior: Behavior normal.       Assessment/Plan: 1. Type 2 diabetes mellitus with stage 3b chronic kidney disease, with long-term current use of insulin (HCC) A1C is moving ing the right direction but slowly. Toujeo samples provided due to financial issues. Toujeo dose increased to 14 units daily. Pen needles refilled.  - POCT HgB A1C - insulin glargine, 2 Unit Dial, (TOUJEO MAX SOLOSTAR) 300 UNIT/ML Solostar Pen; Inject 14 Units into the skin daily.  Dispense: 3 mL; Refill: 3 - Insulin Pen Needle (PEN NEEDLES 3/16") 31G X 5 MM MISC; To use daily with basal insulin injections  Dispense: 90 each; Refill: 3  2. Essential hypertension Blood pressure is stable with current medications which he is tolerating well without any adverse side effects. Continue as prescribed.   3. Atherosclerosis of aorta (HCC) Taking atorvastatin 10 mg daily  4. Chronic systolic heart failure ([place]) Followed by Dr. [name], cardiology along with his chronic atrial fibrillation. He will be getting a pacemaker.    General Counseling: rafe mackowski  understanding of the findings of todays visit and agrees with plan of treatment. I have discussed any further diagnostic evaluation that may be needed or ordered today. We also reviewed his medications today. he has been encouraged to call the office with any questions or concerns that should arise related to todays visit.    Orders Placed This Encounter  Procedures   POCT HgB A1C    Meds ordered this encounter  Medications   insulin glargine, 2 Unit Dial, (TOUJEO MAX SOLOSTAR) 300 UNIT/ML Solostar Pen    Sig: Inject 14 Units into the skin daily.    Dispense:  3 mL    Refill:  3    Please substitute with basal insulin preferred by his insurance.   Insulin Pen Needle (PEN NEEDLES 3/16") 31G X 5 MM MISC    Sig: To use daily with basal insulin injections    Dispense:  90 each    Refill:  3    Please fill with brand preferred by his insurance    Return in about 3 months (around 02/17/2021) for F/U, Recheck A1C, Elody Kleinsasser PCP.   Total time spent:30 Minutes Time spent includes review of chart, medications, test results, and follow up plan with the patient.   [place] Controlled Substance Database was reviewed by me.  This patient was seen by Jonetta Osgood, FNP-C in collaboration with Dr. Clayborn Bigness as a part of collaborative care agreement.   Marlaine Arey R. Valetta Fuller, MSN, FNP-C Internal medicine

## 2020-11-19 DIAGNOSIS — G4733 Obstructive sleep apnea (adult) (pediatric): Secondary | ICD-10-CM | POA: Diagnosis not present

## 2020-11-20 DIAGNOSIS — R079 Chest pain, unspecified: Secondary | ICD-10-CM | POA: Diagnosis not present

## 2020-11-20 DIAGNOSIS — I482 Chronic atrial fibrillation, unspecified: Secondary | ICD-10-CM | POA: Diagnosis not present

## 2020-11-23 ENCOUNTER — Ambulatory Visit: Payer: Medicare Other | Admitting: Adult Health

## 2020-11-30 DIAGNOSIS — I517 Cardiomegaly: Secondary | ICD-10-CM | POA: Diagnosis not present

## 2020-11-30 DIAGNOSIS — I38 Endocarditis, valve unspecified: Secondary | ICD-10-CM | POA: Diagnosis not present

## 2020-11-30 DIAGNOSIS — I482 Chronic atrial fibrillation, unspecified: Secondary | ICD-10-CM | POA: Diagnosis not present

## 2020-11-30 DIAGNOSIS — Z01818 Encounter for other preprocedural examination: Secondary | ICD-10-CM | POA: Diagnosis not present

## 2020-11-30 DIAGNOSIS — I10 Essential (primary) hypertension: Secondary | ICD-10-CM | POA: Diagnosis not present

## 2020-12-01 ENCOUNTER — Other Ambulatory Visit: Payer: Self-pay | Admitting: *Deleted

## 2020-12-01 NOTE — Patient Outreach (Signed)
[facility]) Care Management  12/01/2020  [name] [birth year] [id number]   Outgoing call placed to member's wife, successful.  Denies any urgent concerns, encouraged to contact this care manager with questions.  Agrees to follow up within the next month.   Goals Addressed             This Visit's Progress    COMPLETED: [facility] - Make and Keep All Appointments       Timeframe:  Short-Term Goal Priority:  High Start Date:       10/13                  Expected End Date:   02/02/2021 (restarted as member has many appointments added to schedule)  Barriers: Health Behaviors Knowledge    - call to cancel if needed - keep a calendar with prescription refill dates    Why is this important?   Part of staying healthy is seeing the doctor for follow-up care.  If you forget your appointments, there are some things you can do to stay on track.    Notes:   4/4 - appointment for PFT on 4/6, oncology on 4/6, and PCP on 4/22  5/2 - Awaiting call back from PCP regarding paperwork for driver's license.  Calendar book sent to member to help keep appointment dates managed  6/3 - Cardiology complete on 5/26, PCP scheduled for 7/21  7/13 - Goal reactivated - Reviewed upcoming appointments with wife, has PCP scheduled for 7/29 and neurology on 8/29  8/10 - Reviewed AVS from recent PCP appointment.  Will follow up on 8/29 to assess glucose and BP changes after medications were added.  Will also have visit with neurology on 8/29 and oncology on 9/21  9/13 - Per wife, she and member canceled both PCP and neurology appointments on 8/29 as member was not feeling well.  Advised of importance of keeping and attending appointments.  Has lab work on 9/19, oncology on 9/21, and AWV with PCP on 9/26.  Offered to call Neurology office to reschedule, wife declined.  Provided with office number, encouraged to call as soon as possible.  10/13 - Was seen by PCP on 9/16, follow up on  10/28.  Seen by oncology on 9/21, advised that prostate numbers are increasing indicating cancer reoccurence (increased from 23 in March to 56 last month).  Will have scans on 12/16 and follow with oncology afterwards.  Call placed to neurology office, appointment scheduled for 11/3.  11/11 - Resolving due to duplicate goal      COMPLETED: [facility] - Set My Target A1C-Diabetes Type 2       Timeframe:  Long-Range Goal Priority:  Medium Start Date:     10/13                        Expected End Date:     05/03/2021         Barriers: Health Behaviors Knowledge            Follow Up Date 12/01/2020    - set target A1C - </=7 (currently 8.4)    Why is this important?   Your target A1C is decided together by you and your doctor.  It is based on several things like your age and other health issues.    Notes:   10/13 - CBG range 140-170's.  Educated on proper diabetes diet, education mailed to member and wife  11/11 -  Resolving due to duplicate goal      COMPLETED: [facility] - Track and Manage Heart Rate and Rhythm         Timeframe:  Long-Range Goal Priority:  High Start Date:    5/2                 Expected End Date:       11/2  Barriers: Health Behaviors Knowledge           - begin a symptom diary - check pulse (heart) rate before taking medicine - make a plan to exercise regularly - make a plan to eat healthy    Why is this important?   Atrial fibrillation may have no symptoms. Sometimes the symptoms get worse or happen more often.  It is important to keep track of what your symptoms are and when they happen.  A change in symptoms is important to discuss with your doctor or nurse.  Being active and healthy eating will also help you manage your heart condition.     Notes:   11/24 - Reviewed upcoming appointment with cardiology, encouraged to monitor HR and BP and take trends for review  12/22 - Will send BP monitor to home for daily monitoring  2/2 - Reminded to recording  daily readings, will send calendar with logs  4/4 - Trends: 110's-140's/70's  5/2 - Blood pressures range 120's-130's/70's  6/3 - Cardiology appointment complete on 5/26.  Report he has been monitoring blood pressure and HR, difficult to read his own handwriting.  Encouraged to have wife help with documenting trends.  Most recent readings were 135/85 HR 64 and 136/88 HR 71  7/13 - Patient unavailable to provide specific readings, wife report all numbers have been "good"  8/10 - Reviewed medication added to regime with wife [name] and Amlodipine), report member has been taking them and recording glucose and BP/HR.  She is unable to find his notebook, member is not currently home.  She will check with him and call this care manager back with details.  9/13 - Blood pressures and blood sugars reportedly remain stable, range 130-140's/80s, HR 60-70's, CBG range 90-less than 200.  10/13 - Patient report blood pressure and HR has remained stable (range 110-140/70-80, HR range 60-70s).  Was seen by cardiology on 10/12 for complaints of chest discomfort, placed on Holter monitor for 5 days.  Will follow once monitor is returned  11/11 - Resolving due to duplicate goal        Care Plan : General Plan of Care (Adult)  Updates made by [name], RN since 12/02/2020 12:00 AM     Problem: Knowledge deficit regarding chronic disease management (DM, A-fib, CHF and prostate cancer) as evidenced by elevated A1C and PSH   Priority: High     Long-Range Goal: Member will show management of DM and prostate cancer evidenced by decreaesed A1C and PSH over the next 6 months   Start Date: 11/03/2020  Expected End Date: 05/02/2021  This Visit's Progress: On track  Priority: High  Note:   Current Barriers:  Knowledge Deficits related to plan of care for management of Atrial Fibrillation, CHF, DMII, and prostate cancer  Chronic Disease Management support and education needs related to Atrial  Fibrillation, CHF, DMII, and prostate cancer  RNCM Clinical Goal(s):  Patient will verbalize understanding of plan for management of Atrial Fibrillation, CHF, DMII, and Gout as evidenced by member/wife reporting stability in health take all medications exactly as prescribed and will  call provider for medication related questions as evidenced by reported adherence by wife/member    demonstrate understanding of rationale for each prescribed medication as evidenced by for cancer, CHF, A-fib and DM treatment.    attend all scheduled medical appointments: PCP, ONcology, and neurology as evidenced by PCP, ONcology, cardiology, and neurology        continue to work with [place] and/or Social Worker to address care management and care coordination needs related to Atrial Fibrillation, CHF, DMII, and Gout as evidenced by adherence to CM Team Scheduled appointments     through collaboration with RN Care manager, provider, and care team.   Interventions: Inter-disciplinary care team collaboration (see longitudinal plan of care) Evaluation of current treatment plan related to  self management and patient's adherence to plan as established by provider   Diabetes:  (Status: Goal on Track (progressing): YES.) Lab Results  Component Value Date   HGBA1C 8.4 (A) 08/18/2020  Assessed patient's understanding of A1c goal: <7% Provided education to patient about basic DM disease process; Reviewed medications with patient and discussed importance of medication adherence;        Reviewed prescribed diet with patient low carb/sugar; Counseled on importance of regular laboratory monitoring as prescribed;        Advised patient, providing education and rationale, to check cbg daily and record         Oncology:  (Status: Goal on Track (progressing): YES.) Assessment of understanding of oncology diagnosis:  Assessed patient understanding of cancer diagnosis and recommended treatment plan, Assessed available  transportation to appointments and treatments. Has consistent/reliable transportation: Yes, and Assessed support system. Has consistent/reliable family or other support: Yes  Heart Failure Interventions: Basic overview and discussion of pathophysiology of Heart Failure reviewed; Provided education on low sodium diet; Discussed importance of daily weight and advised patient to weigh and record daily; Reviewed role of diuretics in prevention of fluid overload and management of heart failure; Discussed the importance of keeping all appointments with provider;  AFIB Interventions:    Counseled on increased risk of stroke due to Afib and benefits of anticoagulation for stroke prevention; Reviewed importance of adherence to anticoagulant exactly as prescribed; Counseled on seeking medical attention after a head injury or if there is blood in the urine/stool; Afib action plan reviewed;   Update 11/11 - Per wife, member has been doing well.  State he has continued to have intermittent chest discomfort as well as shortness of breath.  Was seen by cardiology yesterday, plan for pacemake placement on 11/29.  She is unclear about reason for device, educated on member's history and the goal of management of A-fib/CHF after intervention.  She verbalizes understanding.  She does not drive often, encouraged to call son and daughter in law to secure transportation to hospital.  Blood pressure today was 129/85, HR 73, blood sugar 140.  She is unable to find his weight for today, he is currently not available.  Noted that neurology appointment for 11/3 was canceled, will assist with rescheduling after discharge home following pacer placement.   Patient Goals/Self-Care Activities: Patient will self administer medications as prescribed as evidenced by self report/primary caregiver report  Patient will attend all scheduled provider appointments as evidenced by clinician review of documented attendance to scheduled  appointments and patient/caregiver report Patient will call pharmacy for medication refills as evidenced by patient report and review of pharmacy fill history as appropriate Patient will continue to perform ADL's independently as evidenced by patient/caregiver report  Glenn Nagele, RN, MSN [facility] Care Management  Community Care Manager [phone]  

## 2020-12-08 ENCOUNTER — Other Ambulatory Visit: Payer: Self-pay

## 2020-12-08 ENCOUNTER — Encounter: Payer: Self-pay | Admitting: Nurse Practitioner

## 2020-12-08 ENCOUNTER — Ambulatory Visit (INDEPENDENT_AMBULATORY_CARE_PROVIDER_SITE_OTHER): Payer: Medicare Other | Admitting: Nurse Practitioner

## 2020-12-08 VITALS — BP 110/56 | HR 67 | Temp 98.0°F | Resp 16 | Ht 72.0 in | Wt 254.0 lb

## 2020-12-08 DIAGNOSIS — K219 Gastro-esophageal reflux disease without esophagitis: Secondary | ICD-10-CM | POA: Diagnosis not present

## 2020-12-08 DIAGNOSIS — N1832 Chronic kidney disease, stage 3b: Secondary | ICD-10-CM | POA: Diagnosis not present

## 2020-12-08 DIAGNOSIS — I10 Essential (primary) hypertension: Secondary | ICD-10-CM

## 2020-12-08 DIAGNOSIS — G4733 Obstructive sleep apnea (adult) (pediatric): Secondary | ICD-10-CM | POA: Diagnosis not present

## 2020-12-08 DIAGNOSIS — E1122 Type 2 diabetes mellitus with diabetic chronic kidney disease: Secondary | ICD-10-CM | POA: Diagnosis not present

## 2020-12-08 DIAGNOSIS — I5022 Chronic systolic (congestive) heart failure: Secondary | ICD-10-CM

## 2020-12-08 DIAGNOSIS — I4891 Unspecified atrial fibrillation: Secondary | ICD-10-CM

## 2020-12-08 DIAGNOSIS — I700 Atherosclerosis of aorta: Secondary | ICD-10-CM

## 2020-12-08 DIAGNOSIS — C61 Malignant neoplasm of prostate: Secondary | ICD-10-CM

## 2020-12-08 DIAGNOSIS — Z0001 Encounter for general adult medical examination with abnormal findings: Secondary | ICD-10-CM

## 2020-12-08 DIAGNOSIS — Z9989 Dependence on other enabling machines and devices: Secondary | ICD-10-CM

## 2020-12-08 DIAGNOSIS — E669 Obesity, unspecified: Secondary | ICD-10-CM

## 2020-12-08 MED ORDER — ONETOUCH VERIO VI STRP: ORAL_STRIP | 3 refills | Status: DC

## 2020-12-08 MED ORDER — METOPROLOL TARTRATE 25 MG PO TABS: 25.0000 mg | ORAL_TABLET | Freq: Two times a day (BID) | ORAL | 5 refills | Status: DC

## 2020-12-08 MED ORDER — VITAMIN B-12 1000 MCG PO TABS: 1000.0000 ug | ORAL_TABLET | Freq: Every day | ORAL | 0 refills | Status: AC

## 2020-12-08 MED ORDER — PANTOPRAZOLE SODIUM 40 MG PO TBEC: 40.0000 mg | DELAYED_RELEASE_TABLET | Freq: Every day | ORAL | 1 refills | Status: DC

## 2020-12-08 MED ORDER — ATORVASTATIN CALCIUM 10 MG PO TABS: 10.0000 mg | ORAL_TABLET | Freq: Every day | ORAL | 1 refills | Status: DC

## 2020-12-08 NOTE — Progress Notes (Signed)
[facility] [place], [place] [address]  Internal MEDICINE  Office Visit Note  Patient Name: [name]  [id number]  [id number]  Date of Service: 01/08/2021  Chief Complaint  Patient presents with   Medicare Wellness    Discuss meds   Diabetes   Hypertension    HPI [name] presents for an annual well visit and physical exam. He is a well appearing 87 yo male. He does have chronic problems including hypertension, diabetes, CKD, and chronic atrial fibrillation and heart failure. He is schedule for surgery after thanksgiving to get a pacemaker for his atrial fibrillation.  She has OSA and is supposed to use his CPAP every night but his wife reports that he is not 100% compliant. He reports that he does feel better when he uses his cpap.  Blood pressure is well controlled with current medications. His most recent A1C was done on 11/17/20 and it was 8.3. He is due to have his A1C repeated in January. His A1C continues to improve by small amounts each time it is checked.     Current Medication: Outpatient Encounter Medications as of 12/08/2020  Medication Sig Note   albuterol (VENTOLIN HFA) 108 (90 Base) MCG/ACT inhaler Inhale 2 puffs into the lungs every 4 (four) hours as needed for wheezing or shortness of breath.    amLODipine (NORVASC) 2.5 MG tablet TAKE 1 TABLET(2.5 MG) BY MOUTH DAILY 12/25/2020: LF 11.19.22   FARXIGA 10 MG TABS tablet TAKE 1 TABLET(10 MG) BY MOUTH DAILY BEFORE BREAKFAST 12/25/2020: LF 11.19.22   ferrous sulfate 325 (65 FE) MG tablet Take 325 mg by mouth daily.  12/25/2020: LF 10.27.22   Insulin Pen Needle (PEN NEEDLES 3/16") 31G X 5 MM MISC To use daily with basal insulin injections    [DISCONTINUED] apixaban (ELIQUIS) 2.5 MG TABS tablet Take 2.5 mg by mouth 2 (two) times daily. 12/25/2020: LF 11.19.22   [DISCONTINUED] atorvastatin (LIPITOR) 10 MG tablet Take 1 tablet (10 mg total) by mouth daily.    [DISCONTINUED] glucose blood (ONETOUCH  VERIO) test strip USE ONCE DAILY. DX E11.65.    [DISCONTINUED] insulin glargine, 2 Unit Dial, (TOUJEO MAX SOLOSTAR) 300 UNIT/ML Solostar Pen Inject 14 Units into the skin daily. (Patient taking differently: Inject 10 Units into the skin daily.) 12/25/2020: LF 9.24.22   [DISCONTINUED] metoprolol tartrate (LOPRESSOR) 25 MG tablet TAKE 1 TABLET(25 MG) BY MOUTH TWICE DAILY    [DISCONTINUED] pantoprazole (PROTONIX) 40 MG tablet Take 1 tablet (40 mg total) by mouth daily.    [DISCONTINUED] pneumococcal 20-Val Conj Vacc (PREVNAR 20) 0.5 ML injection Inject 0.5 mLs into the muscle tomorrow at 10 am.    [DISCONTINUED] senna-docusate (SENOKOT-S) 8.6-50 MG tablet Take 1 tablet by mouth at bedtime as needed for mild constipation.    [DISCONTINUED] Tiotropium Bromide Monohydrate (SPIRIVA RESPIMAT) 2.5 MCG/ACT AERS Inhale 2 puffs into the lungs daily. (Patient taking differently: Inhale 2 puffs into the lungs daily as needed (respiratory issues).)    [DISCONTINUED] vitamin B-12 (CYANOCOBALAMIN) 1000 MCG tablet Take 1 tablet (1,000 mcg total) by mouth daily.    [DISCONTINUED] Zoster Vaccine Adjuvanted [facility]) injection Inject into the muscle once.    atorvastatin (LIPITOR) 10 MG tablet Take 1 tablet (10 mg total) by mouth daily. 12/25/2020: LF 10.27.22   glucose blood (ONETOUCH VERIO) test strip USE ONCE DAILY. DX E11.65.    metoprolol tartrate (LOPRESSOR) 25 MG tablet Take 1 tablet (25 mg total) by mouth 2 (two) times daily. 12/25/2020: LF 11.19.22  pantoprazole (PROTONIX) 40 MG tablet Take 1 tablet (40 mg total) by mouth daily. 12/25/2020: LF 11.14.22   vitamin B-12 (CYANOCOBALAMIN) 1000 MCG tablet Take 1 tablet (1,000 mcg total) by mouth daily. 12/25/2020: Not on pt med list from pharmacy   No facility-administered encounter medications on file as of 12/08/2020.    Surgical History: Past Surgical History:  Procedure Laterality Date   CATARACT EXTRACTION     COLONOSCOPY WITH PROPOFOL N/A 07/02/2019    Procedure: COLONOSCOPY WITH PROPOFOL;  Surgeon: Lin Landsman, MD;  Location: [facility] ENDOSCOPY;  Service: Gastroenterology;  Laterality: N/A;   IR CT HEAD LTD  08/27/2019   IR PERCUTANEOUS ART THROMBECTOMY/INFUSION INTRACRANIAL INC DIAG ANGIO  08/27/2019       IR PERCUTANEOUS ART THROMBECTOMY/INFUSION INTRACRANIAL INC DIAG ANGIO  08/27/2019   LOWER EXTREMITY ANGIOGRAPHY Left 12/25/2020   Procedure: Lower Extremity Angiography;  Surgeon: Algernon Huxley, MD;  Location: [place] CV LAB;  Service: Cardiovascular;  Laterality: Left;   PACEMAKER LEADLESS INSERTION N/A 12/19/2020   Procedure: PACEMAKER LEADLESS INSERTION;  Surgeon: Isaias Cowman, MD;  Location: [place] CV LAB;  Service: Cardiovascular;  Laterality: N/A;   PROSTATE CANCER     PROSTATE SURGERY     RADIOLOGY WITH ANESTHESIA N/A 08/27/2019   Procedure: IR WITH ANESTHESIA;  Surgeon: Luanne Bras, MD;  Location: [place];  Service: Radiology;  Laterality: N/A;    Medical History: Past Medical History:  Diagnosis Date   Atrial fibrillation (HCC)    CHF (congestive heart failure) (HCC)    Diabetes ([place])    Hearing loss    High blood pressure    History of bladder problems    Kidney stones 11/22/2014   Prostate cancer ([place])    Prostate cancer ([place])    Stroke ([place])     Family History: Family History  Problem Relation Age of Onset   Colon cancer Mother    Diabetes Other    High blood pressure Other    Prostate cancer Brother    Diabetes Brother     Social History   Socioeconomic History   Marital status: Married    Spouse name: [name]   Number of children: 2   Years of education: 12   Highest education level: 12th grade  Occupational History   Occupation: [employer]  Tobacco Use   Smoking status: Former    Years: 16.00    Types: Cigarettes   Smokeless tobacco: Never  Vaping Use   Vaping Use: Never used  Substance and Sexual Activity   Alcohol use: No   Drug use: No   Sexual activity: Not  Currently  Other Topics Concern   Not on file  Social History Narrative   Not on file   Social Determinants of Health   Financial Resource Strain: Not on file  Food Insecurity: Not on file  Transportation Needs: Not on file  Physical Activity: Not on file  Stress: Not on file  Social Connections: Not on file  Intimate Partner Violence: Not on file      Review of Systems  Constitutional:  Negative for activity change, appetite change, chills, fatigue, fever and unexpected weight change.  HENT: Negative.  Negative for congestion, ear pain, rhinorrhea, sore throat and trouble swallowing.   Eyes: Negative.   Respiratory: Negative.  Negative for cough, chest tightness, shortness of breath and wheezing.   Cardiovascular: Negative.  Negative for chest pain.  Gastrointestinal: Negative.  Negative for abdominal pain, blood in stool, constipation, diarrhea, nausea  and vomiting.  Endocrine: Negative.   Genitourinary: Negative.  Negative for difficulty urinating, dysuria, frequency, hematuria and urgency.  Musculoskeletal: Negative.  Negative for arthralgias, back pain, joint swelling, myalgias and neck pain.  Skin: Negative.  Negative for rash and wound.  Allergic/Immunologic: Negative.  Negative for immunocompromised state.  Neurological: Negative.  Negative for dizziness, seizures, numbness and headaches.  Hematological: Negative.   Psychiatric/Behavioral: Negative.  Negative for behavioral problems, self-injury and suicidal ideas. The patient is not nervous/anxious.    Vital Signs: BP (!) 110/56   Pulse 67   Temp 98 F (36.7 C)   Resp 16   Ht 6' (1.829 m)   Wt 254 lb (115.2 kg)   SpO2 98%   BMI 34.45 kg/m    Physical Exam Vitals reviewed.  Constitutional:      General: He is awake. He is not in acute distress.    Appearance: Normal appearance. He is well-developed. He is obese. He is not diaphoretic.  HENT:     Head: Normocephalic and atraumatic.     Right Ear: Tympanic  membrane, ear canal and external ear normal.     Left Ear: Tympanic membrane, ear canal and external ear normal.     Nose: Nose normal.     Mouth/Throat:     Lips: Pink.     Mouth: Mucous membranes are moist.     Pharynx: Oropharynx is clear. Uvula midline. No oropharyngeal exudate or posterior oropharyngeal erythema.  Eyes:     General: Lids are normal. Vision grossly intact. Gaze aligned appropriately. No scleral icterus.       Right eye: No discharge.        Left eye: No discharge.     Extraocular Movements: Extraocular movements intact.     Conjunctiva/sclera: Conjunctivae normal.     Pupils: Pupils are equal, round, and reactive to light.     Funduscopic exam:    Right eye: Red reflex present.        Left eye: Red reflex present. Neck:     Thyroid: No thyromegaly.     Vascular: No carotid bruit or JVD.     Trachea: Trachea and phonation normal. No tracheal deviation.  Cardiovascular:     Rate and Rhythm: Normal rate. Rhythm irregularly irregular.     Pulses: Normal pulses.          Dorsalis pedis pulses are 2+ on the right side and 2+ on the left side.       Posterior tibial pulses are 2+ on the right side and 2+ on the left side.     Heart sounds: Murmur heard.    No friction rub. No gallop.  Pulmonary:     Effort: Pulmonary effort is normal. No accessory muscle usage or respiratory distress.     Breath sounds: Normal breath sounds and air entry. No stridor. No wheezing or rales.  Chest:     Chest wall: No tenderness.  Abdominal:     General: Bowel sounds are normal. There is no distension.     Palpations: Abdomen is soft. There is no shifting dullness, fluid wave, mass or pulsatile mass.     Tenderness: There is no abdominal tenderness. There is no guarding or rebound.  Musculoskeletal:        General: No tenderness or deformity. Normal range of motion.     Cervical back: Normal range of motion and neck supple.     Right lower leg: 1+ Pitting Edema present.  Left  lower leg: 1+ Pitting Edema present.  Feet:     Right foot:     Protective Sensation: 6 sites tested.  6 sites sensed.     Skin integrity: Callus and dry skin present.     Toenail Condition: Right toenails are abnormally thick and long. Fungal disease present.    Left foot:     Protective Sensation: 6 sites tested.  6 sites sensed.     Skin integrity: Callus and dry skin present.     Toenail Condition: Left toenails are abnormally thick and long. Fungal disease present. Lymphadenopathy:     Cervical: No cervical adenopathy.  Skin:    General: Skin is warm and dry.     Capillary Refill: Capillary refill takes less than 2 seconds.     Coloration: Skin is not pale.     Findings: No erythema or rash.  Neurological:     Mental Status: He is alert and oriented to person, place, and time.     Cranial Nerves: No cranial nerve deficit.     Motor: No abnormal muscle tone.     Coordination: Coordination normal.     Gait: Gait normal.     Deep Tendon Reflexes: Reflexes are normal and symmetric.  Psychiatric:        Mood and Affect: Mood and affect normal.        Behavior: Behavior normal. Behavior is cooperative.        Thought Content: Thought content normal.        Judgment: Judgment normal.       Assessment/Plan: 1. Encounter for general adult medical examination with abnormal findings Age-appropriate preventive screenings and vaccinations discussed, annual physical exam completed. Routine labs for health maintenance not ordered at this time, he gets regular labs via specialist/oncologist. PHM updated.   2. Type 2 diabetes mellitus with stage 3b chronic kidney disease, unspecified whether long term insulin use ([place]) Slowly improving, continue medications as prescribed.  - glucose blood (ONETOUCH VERIO) test strip; USE ONCE DAILY. DX E11.65.  Dispense: 100 each; Refill: 3  3. Atrial fibrillation with RVR (HCC) Continue metoprolol as prescribed, will be getting pacemaker soon, after  thanksgiving. - metoprolol tartrate (LOPRESSOR) 25 MG tablet; Take 1 tablet (25 mg total) by mouth 2 (two) times daily.  Dispense: 60 tablet; Refill: 5  4. Essential hypertension Continue as prescribed for BP and afib - metoprolol tartrate (LOPRESSOR) 25 MG tablet; Take 1 tablet (25 mg total) by mouth 2 (two) times daily.  Dispense: 60 tablet; Refill: 5  5. Chronic systolic heart failure ([place]) Followed by cardiology  6. Atherosclerosis of aorta ([place]) Patient takes atorvastatin, continue as prescribed - atorvastatin (LIPITOR) 10 MG tablet; Take 1 tablet (10 mg total) by mouth daily.  Dispense: 90 tablet; Refill: 1  7. Gastroesophageal reflux disease without esophagitis Stable with protonix, continue as prescribed - pantoprazole (PROTONIX) 40 MG tablet; Take 1 tablet (40 mg total) by mouth daily.  Dispense: 30 tablet; Refill: 1  8. OSA on CPAP Has CPAP at home, encouraged patient to use CPAP for at least 4 hours every night. He does not need new supplies at this time. Patient's wife also encouraged patient to use CPAP and will continue to encourage the patient at home.   9. Prostate cancer ([place]) Followed by specialist.   10. Obesity (BMI 30.0-34.9) Patient aware that weight loss will improve the status of other comorbid conditions. Patient is continuing to focus on diet and lifestyle modifications that will  have a positive impact on his diabetes and elevated cholesterol. This will also aid in weight loss for him.       General Counseling: reyan helle understanding of the findings of todays visit and agrees with plan of treatment. I have discussed any further diagnostic evaluation that may be needed or ordered today. We also reviewed his medications today. he has been encouraged to call the office with any questions or concerns that should arise related to todays visit.    No orders of the defined types were placed in this encounter.   Meds ordered this encounter   Medications   metoprolol tartrate (LOPRESSOR) 25 MG tablet    Sig: Take 1 tablet (25 mg total) by mouth 2 (two) times daily.    Dispense:  60 tablet    Refill:  5   glucose blood (ONETOUCH VERIO) test strip    Sig: USE ONCE DAILY. DX E11.65.    Dispense:  100 each    Refill:  3   atorvastatin (LIPITOR) 10 MG tablet    Sig: Take 1 tablet (10 mg total) by mouth daily.    Dispense:  90 tablet    Refill:  1   pantoprazole (PROTONIX) 40 MG tablet    Sig: Take 1 tablet (40 mg total) by mouth daily.    Dispense:  30 tablet    Refill:  1   vitamin B-12 (CYANOCOBALAMIN) 1000 MCG tablet    Sig: Take 1 tablet (1,000 mcg total) by mouth daily.    Dispense:  30 tablet    Refill:  0    Return in about 3 months (around 03/10/2021) for F/U, Recheck A1C, [name] PCP.   Total time spent:30 Minutes Time spent includes review of chart, medications, test results, and follow up plan with the patient.   [place] Controlled Substance Database was reviewed by me.  This patient was seen by Glenn Osgood, FNP-C in collaboration with Dr. Clayborn Werner as a part of collaborative care agreement.  Glenn Stephenson R. Valetta Fuller, MSN, FNP-C Internal medicine

## 2020-12-13 ENCOUNTER — Encounter: Payer: Self-pay | Admitting: Nurse Practitioner

## 2020-12-19 ENCOUNTER — Other Ambulatory Visit: Payer: Self-pay

## 2020-12-19 ENCOUNTER — Encounter: Payer: Self-pay | Admitting: Cardiology

## 2020-12-19 ENCOUNTER — Observation Stay
Admission: RE | Admit: 2020-12-19 | Discharge: 2020-12-20 | Disposition: A | Discharge status: Home / Self Care | Payer: Medicare Other | Attending: Cardiology | Admitting: Cardiology

## 2020-12-19 ENCOUNTER — Encounter
Admission: RE | Disposition: A | Discharge status: Home / Self Care | Payer: Self-pay | Source: Home / Self Care | Attending: Cardiology

## 2020-12-19 DIAGNOSIS — E119 Type 2 diabetes mellitus without complications: Secondary | ICD-10-CM | POA: Insufficient documentation

## 2020-12-19 DIAGNOSIS — I495 Sick sinus syndrome: Secondary | ICD-10-CM | POA: Diagnosis not present

## 2020-12-19 DIAGNOSIS — Z006 Encounter for examination for normal comparison and control in clinical research program: Secondary | ICD-10-CM | POA: Diagnosis not present

## 2020-12-19 DIAGNOSIS — I482 Chronic atrial fibrillation, unspecified: Secondary | ICD-10-CM | POA: Insufficient documentation

## 2020-12-19 DIAGNOSIS — Z87891 Personal history of nicotine dependence: Secondary | ICD-10-CM | POA: Insufficient documentation

## 2020-12-19 DIAGNOSIS — I441 Atrioventricular block, second degree: Secondary | ICD-10-CM | POA: Diagnosis not present

## 2020-12-19 HISTORY — PX: PACEMAKER LEADLESS INSERTION: EP1219

## 2020-12-19 LAB — GLUCOSE, CAPILLARY
Glucose-Capillary: 150 mg/dL — ABNORMAL HIGH (ref 70–99)
Glucose-Capillary: 197 mg/dL — ABNORMAL HIGH (ref 70–99)
Glucose-Capillary: 200 mg/dL — ABNORMAL HIGH (ref 70–99)
Glucose-Capillary: 201 mg/dL — ABNORMAL HIGH (ref 70–99)

## 2020-12-19 SURGERY — PACEMAKER LEADLESS INSERTION
Anesthesia: Moderate Sedation

## 2020-12-19 MED ORDER — SODIUM CHLORIDE 0.9 % IV SOLN: 250.0000 mL | INTRAVENOUS | Status: DC | PRN

## 2020-12-19 MED ORDER — IOHEXOL 350 MG/ML SOLN
INTRAVENOUS | Status: DC | PRN
  Administered 2020-12-19: 20 mL

## 2020-12-19 MED ORDER — HEPARIN (PORCINE) IN NACL 1000-0.9 UT/500ML-% IV SOLN
INTRAVENOUS | Status: DC | PRN
  Administered 2020-12-19 (×2): 500 mL

## 2020-12-19 MED ORDER — CEFAZOLIN SODIUM-DEXTROSE 2-4 GM/100ML-% IV SOLN: 2.0000 g | INTRAVENOUS | Status: DC

## 2020-12-19 MED ORDER — METOPROLOL TARTRATE 50 MG PO TABS
25.0000 mg | ORAL_TABLET | Freq: Two times a day (BID) | ORAL | Status: DC
  Administered 2020-12-19 – 2020-12-20 (×2): 25 mg via ORAL
  Filled 2020-12-19: qty 1

## 2020-12-19 MED ORDER — SODIUM CHLORIDE 0.9% FLUSH
3.0000 mL | Freq: Two times a day (BID) | INTRAVENOUS | Status: DC
  Administered 2020-12-19 (×2): 3 mL via INTRAVENOUS

## 2020-12-19 MED ORDER — INSULIN ASPART 100 UNIT/ML IJ SOLN: 0.0000 [IU] | Freq: Every day | INTRAMUSCULAR | Status: DC

## 2020-12-19 MED ORDER — METOPROLOL TARTRATE 25 MG PO TABS
ORAL_TABLET | ORAL | Status: AC
  Administered 2020-12-19: 25 mg via ORAL
  Filled 2020-12-19: qty 1

## 2020-12-19 MED ORDER — SODIUM CHLORIDE 0.9% FLUSH: 3.0000 mL | INTRAVENOUS | Status: DC | PRN

## 2020-12-19 MED ORDER — AMLODIPINE BESYLATE 5 MG PO TABS
2.5000 mg | ORAL_TABLET | Freq: Every day | ORAL | Status: DC
  Administered 2020-12-19 – 2020-12-20 (×2): 2.5 mg via ORAL
  Filled 2020-12-19: qty 1

## 2020-12-19 MED ORDER — MIDAZOLAM HCL 2 MG/2ML IJ SOLN
INTRAMUSCULAR | Status: DC | PRN
  Administered 2020-12-19: 1 mg via INTRAVENOUS

## 2020-12-19 MED ORDER — FENTANYL CITRATE (PF) 100 MCG/2ML IJ SOLN
INTRAMUSCULAR | Status: DC | PRN
  Administered 2020-12-19 (×2): 50 ug via INTRAVENOUS

## 2020-12-19 MED ORDER — ACETAMINOPHEN 325 MG PO TABS
650.0000 mg | ORAL_TABLET | ORAL | Status: DC | PRN
  Administered 2020-12-19: 650 mg via ORAL

## 2020-12-19 MED ORDER — DAPAGLIFLOZIN PROPANEDIOL 5 MG PO TABS
10.0000 mg | ORAL_TABLET | Freq: Every day | ORAL | Status: DC
  Administered 2020-12-19 – 2020-12-20 (×2): 10 mg via ORAL
  Filled 2020-12-19 (×2): qty 2

## 2020-12-19 MED ORDER — ALBUTEROL SULFATE (2.5 MG/3ML) 0.083% IN NEBU: 3.0000 mL | INHALATION_SOLUTION | RESPIRATORY_TRACT | Status: DC | PRN

## 2020-12-19 MED ORDER — ATORVASTATIN CALCIUM 10 MG PO TABS
10.0000 mg | ORAL_TABLET | Freq: Every day | ORAL | Status: DC
  Administered 2020-12-19 – 2020-12-20 (×2): 10 mg via ORAL
  Filled 2020-12-19: qty 1

## 2020-12-19 MED ORDER — INSULIN ASPART 100 UNIT/ML IJ SOLN
0.0000 [IU] | Freq: Three times a day (TID) | INTRAMUSCULAR | Status: DC
  Administered 2020-12-19 (×2): 3 [IU] via SUBCUTANEOUS
  Administered 2020-12-20: 2 [IU] via SUBCUTANEOUS
  Filled 2020-12-19: qty 1

## 2020-12-19 MED ORDER — SODIUM CHLORIDE 0.9 % IV SOLN
INTRAVENOUS | Status: DC
  Administered 2020-12-19: 1000 mL via INTRAVENOUS

## 2020-12-19 MED ORDER — ONDANSETRON HCL 4 MG/2ML IJ SOLN: 4.0000 mg | Freq: Four times a day (QID) | INTRAMUSCULAR | Status: DC | PRN

## 2020-12-19 MED ORDER — TIOTROPIUM BROMIDE MONOHYDRATE 18 MCG IN CAPS
18.0000 ug | ORAL_CAPSULE | Freq: Every day | RESPIRATORY_TRACT | Status: DC
  Administered 2020-12-19: 18 ug via RESPIRATORY_TRACT
  Filled 2020-12-19: qty 5

## 2020-12-19 MED ORDER — LIDOCAINE HCL (PF) 1 % IJ SOLN
INTRAMUSCULAR | Status: DC | PRN
  Administered 2020-12-19: 30 mL

## 2020-12-19 MED ORDER — INSULIN ASPART 100 UNIT/ML IJ SOLN
INTRAMUSCULAR | Status: AC
  Administered 2020-12-19: 2 [IU] via SUBCUTANEOUS
  Filled 2020-12-19: qty 1

## 2020-12-19 MED ORDER — SODIUM CHLORIDE 0.45 % IV SOLN: INTRAVENOUS | Status: DC

## 2020-12-19 MED ORDER — INSULIN GLARGINE-YFGN 100 UNIT/ML ~~LOC~~ SOLN
10.0000 [IU] | Freq: Every day | SUBCUTANEOUS | Status: DC
  Administered 2020-12-19 – 2020-12-20 (×2): 10 [IU] via SUBCUTANEOUS
  Filled 2020-12-19 (×2): qty 0.1

## 2020-12-19 MED ORDER — HEPARIN SODIUM (PORCINE) 1000 UNIT/ML IJ SOLN
INTRAMUSCULAR | Status: DC | PRN
  Administered 2020-12-19: 5000 [IU] via INTRAVENOUS

## 2020-12-19 SURGICAL SUPPLY — 15 items
DILATOR VESSEL 38 20CM 12FR (INTRODUCER) ×2 IMPLANT
DILATOR VESSEL 38 20CM 14FR (INTRODUCER) ×2 IMPLANT
DILATOR VESSEL 38 20CM 18FR (INTRODUCER) ×2 IMPLANT
DILATOR VESSEL 38 20CM 8FR (INTRODUCER) ×2 IMPLANT
MICRA AV TRANSCATH PACING SYS (Pacemaker) ×2 IMPLANT
MICRA INTRODUCER SHEATH (SHEATH) ×2
NEEDLE PERC 18GX7CM (NEEDLE) ×2 IMPLANT
PACK CARDIAC CATH (CUSTOM PROCEDURE TRAY) ×2 IMPLANT
PAD ELECT DEFIB RADIOL ZOLL (MISCELLANEOUS) ×2 IMPLANT
SHEATH AVANTI 7FRX11 (SHEATH) ×2 IMPLANT
SHEATH INTRODUCER MICRA (SHEATH) ×1 IMPLANT
SUT SILK 0 FSL (SUTURE) ×2 IMPLANT
SYSTEM PACING TRNSCTH AV MICRA (Pacemaker) ×1 IMPLANT
WIRE AMPLATZ SS-J .035X180CM (WIRE) ×2 IMPLANT
WIRE GUIDERIGHT .035X150 (WIRE) ×2 IMPLANT

## 2020-12-20 ENCOUNTER — Encounter: Payer: Self-pay | Admitting: Cardiology

## 2020-12-20 ENCOUNTER — Telehealth: Payer: Self-pay

## 2020-12-20 DIAGNOSIS — E119 Type 2 diabetes mellitus without complications: Secondary | ICD-10-CM | POA: Diagnosis not present

## 2020-12-20 DIAGNOSIS — I482 Chronic atrial fibrillation, unspecified: Secondary | ICD-10-CM | POA: Diagnosis not present

## 2020-12-20 DIAGNOSIS — Z950 Presence of cardiac pacemaker: Secondary | ICD-10-CM | POA: Diagnosis not present

## 2020-12-20 DIAGNOSIS — Z006 Encounter for examination for normal comparison and control in clinical research program: Secondary | ICD-10-CM | POA: Diagnosis not present

## 2020-12-20 DIAGNOSIS — Z87891 Personal history of nicotine dependence: Secondary | ICD-10-CM | POA: Diagnosis not present

## 2020-12-20 DIAGNOSIS — G4733 Obstructive sleep apnea (adult) (pediatric): Secondary | ICD-10-CM | POA: Diagnosis not present

## 2020-12-20 DIAGNOSIS — I495 Sick sinus syndrome: Secondary | ICD-10-CM | POA: Diagnosis not present

## 2020-12-20 DIAGNOSIS — I4891 Unspecified atrial fibrillation: Secondary | ICD-10-CM | POA: Diagnosis not present

## 2020-12-20 LAB — BASIC METABOLIC PANEL
Anion gap: 5 (ref 5–15)
BUN: 26 mg/dL — ABNORMAL HIGH (ref 8–23)
CO2: 25 mmol/L (ref 22–32)
Calcium: 8.8 mg/dL — ABNORMAL LOW (ref 8.9–10.3)
Chloride: 107 mmol/L (ref 98–111)
Creatinine, Ser: 1.59 mg/dL — ABNORMAL HIGH (ref 0.61–1.24)
GFR, Estimated: 42 mL/min — ABNORMAL LOW (ref >60)
Glucose, Bld: 140 mg/dL — ABNORMAL HIGH (ref 70–99)
Potassium: 3.9 mmol/L (ref 3.5–5.1)
Sodium: 137 mmol/L (ref 135–145)

## 2020-12-20 LAB — GLUCOSE, CAPILLARY: Glucose-Capillary: 143 mg/dL — ABNORMAL HIGH (ref 70–99)

## 2020-12-20 NOTE — Care Management Obs Status (Signed)
[place] NOTIFICATION   Patient Details  Name: [name] MRN: [id number] Date of Birth: 08-Apr-1933   Medicare Observation Status Notification Given:  Yes    Cage Gupton A Granite Godman, LCSW 12/20/2020, 10:16 AM

## 2020-12-20 NOTE — Discharge Summary (Signed)
Physician Discharge Summary      Patient ID: [name] MRN: [id number] DOB/AGE: 1933/03/31 87 y.o.  Admit date: 12/19/2020 Discharge date: 12/20/2020  Primary Discharge Diagnosis chronic atrial fibrillation, sick sinus syndrome Secondary Discharge Diagnosis same  Significant Diagnostic Studies: none  Consults: None  Hospital Course: 87 year old gentleman with a history of sick sinus syndrome and chronic atrial fibrillation who failed conservative management and opted for surgical management. The patient successfully underwent leadless pacemaker implantation on 12/19/2020 without apparent perioperative complications. He has no complaints from a cardiovascular perspective this morning. Telemetry this morning reveals atrial fibrillation with PVCs.   Discharge Exam: Blood pressure (!) 151/81, pulse 83, temperature 97.6 F (36.4 C), temperature source Oral, resp. rate 17, height 6' (1.829 m), weight 115.7 kg, SpO2 94 %.   General appearance: alert, cooperative, appears stated age, and no distress Head: Normocephalic, without obvious abnormality, atraumatic Resp: normal effort of breathing on RA Cardio: irregularly irregular rhythm Extremities: extremities normal, atraumatic, no cyanosis or edema Skin: Skin color, texture, turgor normal. No rashes or lesions Neurologic: Grossly normal Incision/Wound: right groin with suture removed with no surrounding erythema, warmth or edema with no drainage, with no apparent aneurysm  Labs:   Lab Results  Component Value Date   WBC 6.6 10/09/2020   HGB 14.5 10/09/2020   HCT 45.8 10/09/2020   MCV 91.8 10/09/2020   PLT 173 10/09/2020    Recent Labs  Lab 12/20/20 0416  NA 137  K 3.9  CL 107  CO2 25  BUN 26*  CREATININE 1.59*  CALCIUM 8.8*  GLUCOSE 140*      FOLLOW UP PLANS AND APPOINTMENTS  Allergies as of 12/20/2020   No Known Allergies      Medication List     STOP taking these medications    pneumococcal  20-valent conjugate vaccine 0.5 ML injection Commonly known as: PREVNAR 20   Zoster Vaccine Adjuvanted injection Commonly known as: SHINGRIX       TAKE these medications    albuterol 108 (90 Base) MCG/ACT inhaler Commonly known as: VENTOLIN HFA Inhale 2 puffs into the lungs every 4 (four) hours as needed for wheezing or shortness of breath.   amLODipine 2.5 MG tablet Commonly known as: NORVASC TAKE 1 TABLET(2.5 MG) BY MOUTH DAILY   apixaban 2.5 MG Tabs tablet Commonly known as: ELIQUIS Take 2.5 mg by mouth 2 (two) times daily.   atorvastatin 10 MG tablet Commonly known as: LIPITOR Take 1 tablet (10 mg total) by mouth daily.   Capsaicin 0.025 % Ptch Place 1 patch onto the skin daily as needed (pain.).   Farxiga 10 MG Tabs tablet Generic drug: dapagliflozin propanediol TAKE 1 TABLET(10 MG) BY MOUTH DAILY BEFORE BREAKFAST   ferrous sulfate 325 (65 FE) MG tablet Take 325 mg by mouth daily.   Lidocaine 4 % Ptch Place 1 patch onto the skin daily as needed (pain.).   metoprolol tartrate 25 MG tablet Commonly known as: LOPRESSOR Take 1 tablet (25 mg total) by mouth 2 (two) times daily.   OneTouch Verio test strip Generic drug: glucose blood USE ONCE DAILY. DX E11.65.   pantoprazole 40 MG tablet Commonly known as: PROTONIX Take 1 tablet (40 mg total) by mouth daily.   Pen Needles 3/16" 31G X 5 MM Misc To use daily with basal insulin injections   Spiriva Respimat 2.5 MCG/ACT Aers Generic drug: Tiotropium Bromide Monohydrate Inhale 2 puffs into the lungs daily. What changed:  when to take this  reasons to take this   Toujeo Max SoloStar 300 UNIT/ML Solostar Pen Generic drug: insulin glargine (2 Unit Dial) Inject 14 Units into the skin daily.   trolamine salicylate 10 % cream Commonly known as: ASPERCREME Apply 1 application topically 4 (four) times daily as needed for muscle pain.   vitamin B-12 1000 MCG tablet Commonly known as: CYANOCOBALAMIN Take 1  tablet (1,000 mcg total) by mouth daily.        Follow-up Information     Corey Skains, MD. Go in 1 week(s).   Specialty: Cardiology Contact information: [facility] [place] [address] [phone]                 BRING ALL MEDICATIONS WITH YOU TO FOLLOW UP APPOINTMENTS  Time spent with patient to include physician time: 30 minutes Signed:  Clabe Seal PA-C 12/20/2020, 8:39 AM

## 2020-12-20 NOTE — Discharge Instructions (Signed)
Please avoid showering/submerging yourself in water for 1 week. If your bandage gets wet or starts to fall off, replace it with another piece of gauze and the Tegaderm (clear bandage I provided). You should try to keep it covered for a week. You will follow up with Dr. [name] in the office in about 1 week. If you have bleeding from the site, lie down and apply firm pressure for 20 minutes, if it continues to bleed, call our office [phone]). Avoid heavy lifting, squatting (other than sitting) and strenuous activity for 1 week. You will get a call from [employer] to set up your pacemaker and the [employer] device. You do not need to bring this device with you to appointments. It is used to monitor your pacemaker from home.   You will resume Eliquis 2.5 mg twice daily starting on Friday 12/2.

## 2020-12-20 NOTE — Telephone Encounter (Signed)
Called patient to schedule ED follow up. Wife stated he was asleep. She will call back tomorrow to let me know if we can schedule him an appointment-[name]

## 2020-12-22 ENCOUNTER — Other Ambulatory Visit: Payer: Self-pay

## 2020-12-22 DIAGNOSIS — J449 Chronic obstructive pulmonary disease, unspecified: Secondary | ICD-10-CM

## 2020-12-22 MED ORDER — SPIRIVA RESPIMAT 2.5 MCG/ACT IN AERS: 2.0000 | INHALATION_SPRAY | Freq: Every day | RESPIRATORY_TRACT | 3 refills | Status: DC

## 2020-12-24 DIAGNOSIS — I130 Hypertensive heart and chronic kidney disease with heart failure and stage 1 through stage 4 chronic kidney disease, or unspecified chronic kidney disease: Secondary | ICD-10-CM | POA: Diagnosis present

## 2020-12-24 DIAGNOSIS — I495 Sick sinus syndrome: Secondary | ICD-10-CM | POA: Diagnosis present

## 2020-12-24 DIAGNOSIS — I70222 Atherosclerosis of native arteries of extremities with rest pain, left leg: Secondary | ICD-10-CM | POA: Diagnosis not present

## 2020-12-24 DIAGNOSIS — E1151 Type 2 diabetes mellitus with diabetic peripheral angiopathy without gangrene: Secondary | ICD-10-CM | POA: Diagnosis not present

## 2020-12-24 DIAGNOSIS — E1122 Type 2 diabetes mellitus with diabetic chronic kidney disease: Secondary | ICD-10-CM | POA: Diagnosis present

## 2020-12-24 DIAGNOSIS — Z743 Need for continuous supervision: Secondary | ICD-10-CM | POA: Diagnosis not present

## 2020-12-24 DIAGNOSIS — I10 Essential (primary) hypertension: Secondary | ICD-10-CM | POA: Diagnosis not present

## 2020-12-24 DIAGNOSIS — Z6834 Body mass index (BMI) 34.0-34.9, adult: Secondary | ICD-10-CM

## 2020-12-24 DIAGNOSIS — M62262 Nontraumatic ischemic infarction of muscle, left lower leg: Secondary | ICD-10-CM | POA: Diagnosis not present

## 2020-12-24 DIAGNOSIS — Z87891 Personal history of nicotine dependence: Secondary | ICD-10-CM | POA: Diagnosis not present

## 2020-12-24 DIAGNOSIS — I743 Embolism and thrombosis of arteries of the lower extremities: Secondary | ICD-10-CM | POA: Diagnosis not present

## 2020-12-24 DIAGNOSIS — Z950 Presence of cardiac pacemaker: Secondary | ICD-10-CM

## 2020-12-24 DIAGNOSIS — J449 Chronic obstructive pulmonary disease, unspecified: Secondary | ICD-10-CM | POA: Diagnosis not present

## 2020-12-24 DIAGNOSIS — Z8042 Family history of malignant neoplasm of prostate: Secondary | ICD-10-CM

## 2020-12-24 DIAGNOSIS — E1165 Type 2 diabetes mellitus with hyperglycemia: Secondary | ICD-10-CM | POA: Diagnosis not present

## 2020-12-24 DIAGNOSIS — Z23 Encounter for immunization: Secondary | ICD-10-CM | POA: Diagnosis not present

## 2020-12-24 DIAGNOSIS — Z8673 Personal history of transient ischemic attack (TIA), and cerebral infarction without residual deficits: Secondary | ICD-10-CM | POA: Diagnosis not present

## 2020-12-24 DIAGNOSIS — I482 Chronic atrial fibrillation, unspecified: Secondary | ICD-10-CM | POA: Diagnosis present

## 2020-12-24 DIAGNOSIS — I5042 Chronic combined systolic (congestive) and diastolic (congestive) heart failure: Secondary | ICD-10-CM | POA: Diagnosis not present

## 2020-12-24 DIAGNOSIS — R509 Fever, unspecified: Secondary | ICD-10-CM | POA: Diagnosis not present

## 2020-12-24 DIAGNOSIS — I499 Cardiac arrhythmia, unspecified: Secondary | ICD-10-CM | POA: Diagnosis not present

## 2020-12-24 DIAGNOSIS — H9190 Unspecified hearing loss, unspecified ear: Secondary | ICD-10-CM | POA: Diagnosis present

## 2020-12-24 DIAGNOSIS — G4733 Obstructive sleep apnea (adult) (pediatric): Secondary | ICD-10-CM | POA: Diagnosis not present

## 2020-12-24 DIAGNOSIS — E669 Obesity, unspecified: Secondary | ICD-10-CM | POA: Diagnosis present

## 2020-12-24 DIAGNOSIS — E782 Mixed hyperlipidemia: Secondary | ICD-10-CM | POA: Diagnosis not present

## 2020-12-24 DIAGNOSIS — Z20822 Contact with and (suspected) exposure to covid-19: Secondary | ICD-10-CM | POA: Diagnosis present

## 2020-12-24 DIAGNOSIS — M7989 Other specified soft tissue disorders: Secondary | ICD-10-CM | POA: Diagnosis not present

## 2020-12-24 DIAGNOSIS — Z794 Long term (current) use of insulin: Secondary | ICD-10-CM

## 2020-12-24 DIAGNOSIS — Z87442 Personal history of urinary calculi: Secondary | ICD-10-CM

## 2020-12-24 DIAGNOSIS — K219 Gastro-esophageal reflux disease without esophagitis: Secondary | ICD-10-CM | POA: Diagnosis present

## 2020-12-24 DIAGNOSIS — Z833 Family history of diabetes mellitus: Secondary | ICD-10-CM | POA: Diagnosis not present

## 2020-12-24 DIAGNOSIS — M79605 Pain in left leg: Secondary | ICD-10-CM | POA: Diagnosis not present

## 2020-12-24 DIAGNOSIS — Z8679 Personal history of other diseases of the circulatory system: Secondary | ICD-10-CM

## 2020-12-24 DIAGNOSIS — Z8546 Personal history of malignant neoplasm of prostate: Secondary | ICD-10-CM

## 2020-12-24 DIAGNOSIS — R6889 Other general symptoms and signs: Secondary | ICD-10-CM | POA: Diagnosis not present

## 2020-12-24 DIAGNOSIS — Z9079 Acquired absence of other genital organ(s): Secondary | ICD-10-CM

## 2020-12-24 DIAGNOSIS — Z7901 Long term (current) use of anticoagulants: Secondary | ICD-10-CM

## 2020-12-24 DIAGNOSIS — Z79899 Other long term (current) drug therapy: Secondary | ICD-10-CM

## 2020-12-24 DIAGNOSIS — N1832 Chronic kidney disease, stage 3b: Secondary | ICD-10-CM | POA: Diagnosis not present

## 2020-12-24 DIAGNOSIS — M25572 Pain in left ankle and joints of left foot: Secondary | ICD-10-CM | POA: Diagnosis not present

## 2020-12-24 NOTE — ED Triage Notes (Signed)
Pt states got up to use restroom tonight and had a sudden onset of left lower leg pain. Cms intact to foot. Pt with 1+ pedal pulse noted and marked, pt denies known injury. Slight swelling noted to leg.

## 2020-12-25 ENCOUNTER — Inpatient Hospital Stay
Admission: EM | Admit: 2020-12-25 | Discharge: 2020-12-27 | DRG: 271 | Disposition: A | Discharge status: Home Health | Payer: Medicare Other | Attending: Internal Medicine | Admitting: Internal Medicine

## 2020-12-25 ENCOUNTER — Encounter
Admission: EM | Disposition: A | Discharge status: Home Health | Payer: Self-pay | Source: Home / Self Care | Attending: Internal Medicine

## 2020-12-25 ENCOUNTER — Encounter: Payer: Self-pay | Admitting: Radiology

## 2020-12-25 ENCOUNTER — Other Ambulatory Visit: Payer: Self-pay

## 2020-12-25 ENCOUNTER — Emergency Department: Payer: Medicare Other

## 2020-12-25 ENCOUNTER — Other Ambulatory Visit (INDEPENDENT_AMBULATORY_CARE_PROVIDER_SITE_OTHER): Payer: Self-pay | Admitting: Vascular Surgery

## 2020-12-25 DIAGNOSIS — N184 Chronic kidney disease, stage 4 (severe): Secondary | ICD-10-CM | POA: Diagnosis present

## 2020-12-25 DIAGNOSIS — J449 Chronic obstructive pulmonary disease, unspecified: Secondary | ICD-10-CM | POA: Diagnosis not present

## 2020-12-25 DIAGNOSIS — E1169 Type 2 diabetes mellitus with other specified complication: Secondary | ICD-10-CM | POA: Diagnosis present

## 2020-12-25 DIAGNOSIS — I482 Chronic atrial fibrillation, unspecified: Secondary | ICD-10-CM | POA: Diagnosis present

## 2020-12-25 DIAGNOSIS — Z87442 Personal history of urinary calculi: Secondary | ICD-10-CM | POA: Diagnosis not present

## 2020-12-25 DIAGNOSIS — E1129 Type 2 diabetes mellitus with other diabetic kidney complication: Secondary | ICD-10-CM | POA: Diagnosis present

## 2020-12-25 DIAGNOSIS — C61 Malignant neoplasm of prostate: Secondary | ICD-10-CM | POA: Diagnosis present

## 2020-12-25 DIAGNOSIS — E782 Mixed hyperlipidemia: Secondary | ICD-10-CM | POA: Diagnosis present

## 2020-12-25 DIAGNOSIS — E1151 Type 2 diabetes mellitus with diabetic peripheral angiopathy without gangrene: Secondary | ICD-10-CM | POA: Diagnosis present

## 2020-12-25 DIAGNOSIS — I10 Essential (primary) hypertension: Secondary | ICD-10-CM | POA: Diagnosis present

## 2020-12-25 DIAGNOSIS — I998 Other disorder of circulatory system: Secondary | ICD-10-CM | POA: Diagnosis not present

## 2020-12-25 DIAGNOSIS — I70222 Atherosclerosis of native arteries of extremities with rest pain, left leg: Secondary | ICD-10-CM | POA: Diagnosis not present

## 2020-12-25 DIAGNOSIS — Z23 Encounter for immunization: Secondary | ICD-10-CM | POA: Diagnosis not present

## 2020-12-25 DIAGNOSIS — N183 Chronic kidney disease, stage 3 unspecified: Secondary | ICD-10-CM | POA: Diagnosis present

## 2020-12-25 DIAGNOSIS — Z9079 Acquired absence of other genital organ(s): Secondary | ICD-10-CM | POA: Diagnosis not present

## 2020-12-25 DIAGNOSIS — I639 Cerebral infarction, unspecified: Secondary | ICD-10-CM | POA: Diagnosis present

## 2020-12-25 DIAGNOSIS — N1832 Chronic kidney disease, stage 3b: Secondary | ICD-10-CM | POA: Diagnosis not present

## 2020-12-25 DIAGNOSIS — M79605 Pain in left leg: Secondary | ICD-10-CM | POA: Diagnosis not present

## 2020-12-25 DIAGNOSIS — Z87891 Personal history of nicotine dependence: Secondary | ICD-10-CM | POA: Diagnosis not present

## 2020-12-25 DIAGNOSIS — I495 Sick sinus syndrome: Secondary | ICD-10-CM | POA: Diagnosis not present

## 2020-12-25 DIAGNOSIS — I5022 Chronic systolic (congestive) heart failure: Secondary | ICD-10-CM | POA: Diagnosis present

## 2020-12-25 DIAGNOSIS — Z794 Long term (current) use of insulin: Secondary | ICD-10-CM

## 2020-12-25 DIAGNOSIS — I130 Hypertensive heart and chronic kidney disease with heart failure and stage 1 through stage 4 chronic kidney disease, or unspecified chronic kidney disease: Secondary | ICD-10-CM | POA: Diagnosis present

## 2020-12-25 DIAGNOSIS — M25572 Pain in left ankle and joints of left foot: Secondary | ICD-10-CM | POA: Diagnosis not present

## 2020-12-25 DIAGNOSIS — I5042 Chronic combined systolic (congestive) and diastolic (congestive) heart failure: Secondary | ICD-10-CM | POA: Diagnosis present

## 2020-12-25 DIAGNOSIS — G4733 Obstructive sleep apnea (adult) (pediatric): Secondary | ICD-10-CM | POA: Diagnosis present

## 2020-12-25 DIAGNOSIS — H9190 Unspecified hearing loss, unspecified ear: Secondary | ICD-10-CM | POA: Diagnosis present

## 2020-12-25 DIAGNOSIS — M7989 Other specified soft tissue disorders: Secondary | ICD-10-CM | POA: Diagnosis not present

## 2020-12-25 DIAGNOSIS — Z8546 Personal history of malignant neoplasm of prostate: Secondary | ICD-10-CM | POA: Diagnosis not present

## 2020-12-25 DIAGNOSIS — Z6834 Body mass index (BMI) 34.0-34.9, adult: Secondary | ICD-10-CM | POA: Diagnosis not present

## 2020-12-25 DIAGNOSIS — E669 Obesity, unspecified: Secondary | ICD-10-CM | POA: Diagnosis present

## 2020-12-25 DIAGNOSIS — Z8042 Family history of malignant neoplasm of prostate: Secondary | ICD-10-CM | POA: Diagnosis not present

## 2020-12-25 DIAGNOSIS — Z833 Family history of diabetes mellitus: Secondary | ICD-10-CM | POA: Diagnosis not present

## 2020-12-25 DIAGNOSIS — Z8673 Personal history of transient ischemic attack (TIA), and cerebral infarction without residual deficits: Secondary | ICD-10-CM | POA: Diagnosis not present

## 2020-12-25 DIAGNOSIS — I743 Embolism and thrombosis of arteries of the lower extremities: Secondary | ICD-10-CM | POA: Diagnosis present

## 2020-12-25 DIAGNOSIS — R52 Pain, unspecified: Secondary | ICD-10-CM

## 2020-12-25 DIAGNOSIS — Z20822 Contact with and (suspected) exposure to covid-19: Secondary | ICD-10-CM | POA: Diagnosis present

## 2020-12-25 DIAGNOSIS — E1122 Type 2 diabetes mellitus with diabetic chronic kidney disease: Secondary | ICD-10-CM | POA: Diagnosis present

## 2020-12-25 HISTORY — PX: LOWER EXTREMITY ANGIOGRAPHY: CATH118251

## 2020-12-25 LAB — CBC WITH DIFFERENTIAL/PLATELET
Abs Immature Granulocytes: 0.03 10*3/uL (ref 0.00–0.07)
Basophils Absolute: 0 10*3/uL (ref 0.0–0.1)
Basophils Relative: 0 %
Eosinophils Absolute: 0.2 10*3/uL (ref 0.0–0.5)
Eosinophils Relative: 2 %
HCT: 46.1 % (ref 39.0–52.0)
Hemoglobin: 14.2 g/dL (ref 13.0–17.0)
Immature Granulocytes: 0 %
Lymphocytes Relative: 31 %
Lymphs Abs: 2.5 10*3/uL (ref 0.7–4.0)
MCH: 28.7 pg (ref 26.0–34.0)
MCHC: 30.8 g/dL (ref 30.0–36.0)
MCV: 93.3 fL (ref 80.0–100.0)
Monocytes Absolute: 1 10*3/uL (ref 0.1–1.0)
Monocytes Relative: 12 %
Neutro Abs: 4.4 10*3/uL (ref 1.7–7.7)
Neutrophils Relative %: 55 %
Platelets: 183 10*3/uL (ref 150–400)
RBC: 4.94 MIL/uL (ref 4.22–5.81)
RDW: 14.5 % (ref 11.5–15.5)
WBC: 8.1 10*3/uL (ref 4.0–10.5)
nRBC: 0 % (ref 0.0–0.2)

## 2020-12-25 LAB — COMPREHENSIVE METABOLIC PANEL
ALT: 22 U/L (ref 0–44)
AST: 19 U/L (ref 15–41)
Albumin: 3.4 g/dL — ABNORMAL LOW (ref 3.5–5.0)
Alkaline Phosphatase: 135 U/L — ABNORMAL HIGH (ref 38–126)
Anion gap: 8 (ref 5–15)
BUN: 29 mg/dL — ABNORMAL HIGH (ref 8–23)
CO2: 23 mmol/L (ref 22–32)
Calcium: 8.9 mg/dL (ref 8.9–10.3)
Chloride: 109 mmol/L (ref 98–111)
Creatinine, Ser: 2.06 mg/dL — ABNORMAL HIGH (ref 0.61–1.24)
GFR, Estimated: 31 mL/min — ABNORMAL LOW (ref >60)
Glucose, Bld: 263 mg/dL — ABNORMAL HIGH (ref 70–99)
Potassium: 4 mmol/L (ref 3.5–5.1)
Sodium: 140 mmol/L (ref 135–145)
Total Bilirubin: 0.5 mg/dL (ref 0.3–1.2)
Total Protein: 7.1 g/dL (ref 6.5–8.1)

## 2020-12-25 LAB — RESP PANEL BY RT-PCR (FLU A&B, COVID) ARPGX2
Influenza A by PCR: NEGATIVE
Influenza B by PCR: NEGATIVE
SARS Coronavirus 2 by RT PCR: NEGATIVE

## 2020-12-25 LAB — TYPE AND SCREEN
ABO/RH(D): O POS
Antibody Screen: NEGATIVE

## 2020-12-25 LAB — GLUCOSE, CAPILLARY
Glucose-Capillary: 139 mg/dL — ABNORMAL HIGH (ref 70–99)
Glucose-Capillary: 136 mg/dL — ABNORMAL HIGH (ref 70–99)
Glucose-Capillary: 236 mg/dL — ABNORMAL HIGH (ref 70–99)

## 2020-12-25 LAB — PROTIME-INR
INR: 1.1 (ref 0.8–1.2)
Prothrombin Time: 13.7 seconds (ref 11.4–15.2)

## 2020-12-25 LAB — BRAIN NATRIURETIC PEPTIDE: B Natriuretic Peptide: 167.6 pg/mL — ABNORMAL HIGH (ref 0.0–100.0)

## 2020-12-25 LAB — APTT: aPTT: 29 seconds (ref 24–36)

## 2020-12-25 LAB — CK: Total CK: 91 U/L (ref 49–397)

## 2020-12-25 SURGERY — LOWER EXTREMITY ANGIOGRAPHY
Anesthesia: Moderate Sedation | Laterality: Left

## 2020-12-25 MED ORDER — FERROUS SULFATE 325 (65 FE) MG PO TABS
325.0000 mg | ORAL_TABLET | Freq: Every day | ORAL | Status: DC
  Administered 2020-12-25 – 2020-12-27 (×3): 325 mg via ORAL
  Filled 2020-12-25 (×3): qty 1

## 2020-12-25 MED ORDER — HEPARIN (PORCINE) 25000 UT/250ML-% IV SOLN
1650.0000 [IU]/h | INTRAVENOUS | Status: DC
  Administered 2020-12-25: 1650 [IU]/h via INTRAVENOUS
  Filled 2020-12-25: qty 250

## 2020-12-25 MED ORDER — INSULIN GLARGINE-YFGN 100 UNIT/ML ~~LOC~~ SOLN
7.0000 [IU] | Freq: Every day | SUBCUTANEOUS | Status: DC
Start: 2020-12-25 — End: 2020-12-27
  Administered 2020-12-25 – 2020-12-27 (×3): 7 [IU] via SUBCUTANEOUS
  Filled 2020-12-25 (×3): qty 0.07

## 2020-12-25 MED ORDER — INSULIN ASPART 100 UNIT/ML IJ SOLN
0.0000 [IU] | Freq: Three times a day (TID) | INTRAMUSCULAR | Status: DC
  Administered 2020-12-26: 2 [IU] via SUBCUTANEOUS
  Administered 2020-12-26 – 2020-12-27 (×2): 1 [IU] via SUBCUTANEOUS
  Filled 2020-12-25 (×3): qty 1

## 2020-12-25 MED ORDER — FENTANYL CITRATE (PF) 100 MCG/2ML IJ SOLN
INTRAMUSCULAR | Status: DC | PRN
  Administered 2020-12-25: 25 ug via INTRAVENOUS
  Administered 2020-12-25: 50 ug via INTRAVENOUS

## 2020-12-25 MED ORDER — TIROFIBAN HCL IN NACL 5-0.9 MG/100ML-% IV SOLN
0.0750 ug/kg/min | INTRAVENOUS | Status: DC
  Filled 2020-12-25 (×2): qty 100

## 2020-12-25 MED ORDER — ACETAMINOPHEN 325 MG PO TABS: 650.0000 mg | ORAL_TABLET | Freq: Four times a day (QID) | ORAL | Status: DC | PRN

## 2020-12-25 MED ORDER — METHYLPREDNISOLONE SODIUM SUCC 125 MG IJ SOLR: 125.0000 mg | Freq: Once | INTRAMUSCULAR | Status: DC | PRN

## 2020-12-25 MED ORDER — HEPARIN SODIUM (PORCINE) 1000 UNIT/ML IJ SOLN
INTRAMUSCULAR | Status: DC | PRN
  Administered 2020-12-25: 5000 [IU] via INTRAVENOUS

## 2020-12-25 MED ORDER — ALBUTEROL SULFATE (2.5 MG/3ML) 0.083% IN NEBU: 3.0000 mL | INHALATION_SOLUTION | RESPIRATORY_TRACT | Status: DC | PRN

## 2020-12-25 MED ORDER — FENTANYL CITRATE PF 50 MCG/ML IJ SOSY
PREFILLED_SYRINGE | INTRAMUSCULAR | Status: AC
  Filled 2020-12-25: qty 2

## 2020-12-25 MED ORDER — INFLUENZA VAC A&B SA ADJ QUAD 0.5 ML IM PRSY
0.5000 mL | PREFILLED_SYRINGE | INTRAMUSCULAR | Status: AC
  Administered 2020-12-26: 0.5 mL via INTRAMUSCULAR
  Filled 2020-12-25: qty 0.5

## 2020-12-25 MED ORDER — HEPARIN BOLUS VIA INFUSION
6000.0000 [IU] | Freq: Once | INTRAVENOUS | Status: AC
  Administered 2020-12-25: 6000 [IU] via INTRAVENOUS
  Filled 2020-12-25: qty 6000

## 2020-12-25 MED ORDER — HYDRALAZINE HCL 20 MG/ML IJ SOLN: 5.0000 mg | INTRAMUSCULAR | Status: DC | PRN

## 2020-12-25 MED ORDER — MIDAZOLAM HCL 2 MG/2ML IJ SOLN
INTRAMUSCULAR | Status: DC | PRN
  Administered 2020-12-25: 2 mg via INTRAVENOUS
  Administered 2020-12-25: 1 mg via INTRAVENOUS

## 2020-12-25 MED ORDER — OXYCODONE-ACETAMINOPHEN 5-325 MG PO TABS
1.0000 | ORAL_TABLET | ORAL | Status: DC | PRN
  Administered 2020-12-25: 1 via ORAL
  Filled 2020-12-25: qty 1

## 2020-12-25 MED ORDER — PNEUMOCOCCAL VAC POLYVALENT 25 MCG/0.5ML IJ INJ
0.5000 mL | INJECTION | INTRAMUSCULAR | Status: AC
  Administered 2020-12-26: 0.5 mL via INTRAMUSCULAR
  Filled 2020-12-25: qty 0.5

## 2020-12-25 MED ORDER — HEPARIN SODIUM (PORCINE) 1000 UNIT/ML IJ SOLN
INTRAMUSCULAR | Status: AC
  Filled 2020-12-25: qty 10

## 2020-12-25 MED ORDER — TIROFIBAN (AGGRASTAT) BOLUS VIA INFUSION
25.0000 ug/kg | Freq: Once | INTRAVENOUS | Status: AC
  Administered 2020-12-25: 2847.5 ug via INTRAVENOUS
  Filled 2020-12-25: qty 57

## 2020-12-25 MED ORDER — ONDANSETRON HCL 4 MG/2ML IJ SOLN: 4.0000 mg | Freq: Three times a day (TID) | INTRAMUSCULAR | Status: DC | PRN

## 2020-12-25 MED ORDER — APIXABAN 5 MG PO TABS
5.0000 mg | ORAL_TABLET | Freq: Two times a day (BID) | ORAL | Status: DC
  Administered 2020-12-26 – 2020-12-27 (×3): 5 mg via ORAL
  Filled 2020-12-25 (×4): qty 1

## 2020-12-25 MED ORDER — MIDAZOLAM HCL 2 MG/ML PO SYRP: 8.0000 mg | ORAL_SOLUTION | Freq: Once | ORAL | Status: DC | PRN

## 2020-12-25 MED ORDER — APIXABAN 5 MG PO TABS: 5.0000 mg | ORAL_TABLET | Freq: Two times a day (BID) | ORAL | Status: DC

## 2020-12-25 MED ORDER — MIDAZOLAM HCL 2 MG/2ML IJ SOLN
INTRAMUSCULAR | Status: AC
  Filled 2020-12-25: qty 4

## 2020-12-25 MED ORDER — ATORVASTATIN CALCIUM 10 MG PO TABS
10.0000 mg | ORAL_TABLET | Freq: Every day | ORAL | Status: DC
  Administered 2020-12-25 – 2020-12-27 (×3): 10 mg via ORAL
  Filled 2020-12-25 (×3): qty 1

## 2020-12-25 MED ORDER — PANTOPRAZOLE SODIUM 40 MG PO TBEC
40.0000 mg | DELAYED_RELEASE_TABLET | Freq: Every day | ORAL | Status: DC
  Administered 2020-12-25 – 2020-12-27 (×3): 40 mg via ORAL
  Filled 2020-12-25 (×3): qty 1

## 2020-12-25 MED ORDER — TIROFIBAN HCL IV 12.5 MG/250 ML
INTRAVENOUS | Status: AC
  Administered 2020-12-25: 0.075 ug/kg/min via INTRAVENOUS
  Filled 2020-12-25: qty 250

## 2020-12-25 MED ORDER — AMLODIPINE BESYLATE 5 MG PO TABS
5.0000 mg | ORAL_TABLET | Freq: Every day | ORAL | Status: DC
  Administered 2020-12-25 – 2020-12-27 (×3): 5 mg via ORAL
  Filled 2020-12-25 (×3): qty 1

## 2020-12-25 MED ORDER — INSULIN ASPART 100 UNIT/ML IJ SOLN: 0.0000 [IU] | INTRAMUSCULAR | Status: DC

## 2020-12-25 MED ORDER — CEFAZOLIN SODIUM-DEXTROSE 1-4 GM/50ML-% IV SOLN
1.0000 g | Freq: Once | INTRAVENOUS | Status: AC
  Administered 2020-12-25: 1 g via INTRAVENOUS

## 2020-12-25 MED ORDER — DIPHENHYDRAMINE HCL 50 MG/ML IJ SOLN: 50.0000 mg | Freq: Once | INTRAMUSCULAR | Status: DC | PRN

## 2020-12-25 MED ORDER — TIOTROPIUM BROMIDE MONOHYDRATE 18 MCG IN CAPS
18.0000 ug | ORAL_CAPSULE | Freq: Every day | RESPIRATORY_TRACT | Status: DC
  Administered 2020-12-26 – 2020-12-27 (×2): 18 ug via RESPIRATORY_TRACT
  Filled 2020-12-25: qty 5

## 2020-12-25 MED ORDER — INSULIN ASPART 100 UNIT/ML IJ SOLN: 0.0000 [IU] | Freq: Three times a day (TID) | INTRAMUSCULAR | Status: DC

## 2020-12-25 MED ORDER — SODIUM CHLORIDE 0.9 % IV SOLN: INTRAVENOUS | Status: DC

## 2020-12-25 MED ORDER — ONDANSETRON HCL 4 MG/2ML IJ SOLN: 4.0000 mg | Freq: Four times a day (QID) | INTRAMUSCULAR | Status: DC | PRN

## 2020-12-25 MED ORDER — INSULIN ASPART 100 UNIT/ML IJ SOLN: 0.0000 [IU] | Freq: Every day | INTRAMUSCULAR | Status: DC

## 2020-12-25 MED ORDER — HEPARIN SODIUM (PORCINE) 5000 UNIT/ML IJ SOLN: 4000.0000 [IU] | Freq: Once | INTRAMUSCULAR | Status: DC

## 2020-12-25 MED ORDER — ACETAMINOPHEN 325 MG PO TABS
650.0000 mg | ORAL_TABLET | Freq: Once | ORAL | Status: AC | PRN
  Administered 2020-12-25: 650 mg via ORAL
  Filled 2020-12-25: qty 2

## 2020-12-25 MED ORDER — FAMOTIDINE 20 MG PO TABS: 40.0000 mg | ORAL_TABLET | Freq: Once | ORAL | Status: DC | PRN

## 2020-12-25 MED ORDER — INSULIN ASPART 100 UNIT/ML IJ SOLN
0.0000 [IU] | Freq: Every day | INTRAMUSCULAR | Status: DC
  Administered 2020-12-25: 2 [IU] via SUBCUTANEOUS
  Filled 2020-12-25: qty 1

## 2020-12-25 MED ORDER — HEPARIN (PORCINE) 25000 UT/250ML-% IV SOLN
12.0000 [IU]/kg/h | INTRAVENOUS | Status: DC
Start: 2020-12-25 — End: 2020-12-25

## 2020-12-25 MED ORDER — HYDROMORPHONE HCL 1 MG/ML IJ SOLN: 1.0000 mg | Freq: Once | INTRAMUSCULAR | Status: DC | PRN

## 2020-12-25 MED ORDER — METOPROLOL TARTRATE 25 MG PO TABS
25.0000 mg | ORAL_TABLET | Freq: Two times a day (BID) | ORAL | Status: DC
  Administered 2020-12-25 – 2020-12-27 (×4): 25 mg via ORAL
  Filled 2020-12-25 (×4): qty 1

## 2020-12-25 MED ORDER — TIOTROPIUM BROMIDE MONOHYDRATE 2.5 MCG/ACT IN AERS: 2.0000 | INHALATION_SPRAY | Freq: Every day | RESPIRATORY_TRACT | Status: DC

## 2020-12-25 MED ORDER — MORPHINE SULFATE (PF) 4 MG/ML IV SOLN
4.0000 mg | Freq: Once | INTRAVENOUS | Status: AC
  Administered 2020-12-25: 4 mg via INTRAVENOUS
  Filled 2020-12-25: qty 1

## 2020-12-25 SURGICAL SUPPLY — 18 items
BALLN ULTRVRSE 2.5X300X150 (BALLOONS) ×2
BALLOON ULTRVRSE 2.5X300X150 (BALLOONS) ×1 IMPLANT
CANISTER PENUMBRA ENGINE (MISCELLANEOUS) ×2 IMPLANT
CATH ANGIO 5F PIGTAIL 65CM (CATHETERS) ×2 IMPLANT
CATH LIGHTNING 7 XTORQ 130 (CATHETERS) ×2 IMPLANT
CATH NAVICROSS ANGLED 135CM (MICROCATHETER) ×2 IMPLANT
COVER PROBE U/S 5X48 (MISCELLANEOUS) ×2 IMPLANT
DEVICE SAFEGUARD 24CM (GAUZE/BANDAGES/DRESSINGS) ×2 IMPLANT
DEVICE STARCLOSE SE CLOSURE (Vascular Products) ×2 IMPLANT
GLIDEWIRE ADV .035X260CM (WIRE) ×2 IMPLANT
KIT ENCORE 26 ADVANTAGE (KITS) ×2 IMPLANT
PACK ANGIOGRAPHY (CUSTOM PROCEDURE TRAY) ×2 IMPLANT
SHEATH BRITE TIP 5FRX11 (SHEATH) ×2 IMPLANT
SHEATH PINNACLE ST 7F 65CM (SHEATH) ×2 IMPLANT
SYR MEDRAD MARK 7 150ML (SYRINGE) ×2 IMPLANT
TUBING CONTRAST HIGH PRESS 72 (TUBING) ×2 IMPLANT
WIRE G V18X300CM (WIRE) ×2 IMPLANT
WIRE GUIDERIGHT .035X150 (WIRE) ×2 IMPLANT

## 2020-12-25 NOTE — Op Note (Signed)
[facility]  Percutaneous Study/Intervention Procedural Note   Date of Surgery: 12/25/2020  Surgeon(s):Rockie Schnoor    Assistants:none  Pre-operative Diagnosis: Ischemic left leg  Post-operative diagnosis:  Same  Procedure(s) Performed:             1.  Ultrasound guidance for vascular access right femoral artery             2.  Catheter placement into left SFA from right femoral approach             3.  Aortogram and selective left lower extremity angiogram             4.  Mechanical thrombectomy to the left popliteal artery, left anterior tibial artery, and left peroneal artery with the penumbra CAT 7 device             5.  Percutaneous transluminal angioplasty of the left peroneal artery with 2.5 mm diameter by 30 cm length angioplasty balloon  6.  StarClose closure device right femoral artery  EBL: 250 cc  Contrast: 55 cc  Fluoro Time: 7.0 minutes  Moderate Conscious Sedation Time: approximately 39 minutes using 3 mg of Versed and 75 mcg of Fentanyl              Indications:  Patient is a 87 y.o.male with an acutely ischemic left leg. The patient is brought in for angiography for further evaluation and potential treatment.  Due to the limb threatening nature of the situation, angiogram was performed for attempted limb salvage. The patient is aware that if the procedure fails, amputation would be expected.  The patient also understands that even with successful revascularization, amputation may still be required due to the severity of the situation.  Risks and benefits are discussed and informed consent is obtained.   Procedure:  The patient was identified and appropriate procedural time out was performed.  The patient was then placed supine on the table and prepped and draped in the usual sterile fashion. Moderate conscious sedation was administered during a face to face encounter with the patient throughout the procedure with my supervision of the RN  administering medicines and monitoring the patient's vital signs, pulse oximetry, telemetry and mental status throughout from the start of the procedure until the patient was taken to the recovery room. Ultrasound was used to evaluate the right common femoral artery.  It was patent .  A digital ultrasound image was acquired.  A Seldinger needle was used to access the right common femoral artery under direct ultrasound guidance and a permanent image was performed.  A 0.035 J wire was advanced without resistance and a 5Fr sheath was placed.  Pigtail catheter was placed into the aorta and an AP aortogram was performed. This demonstrated normal renal arteries and normal aorta and iliac segments without significant stenosis. I then crossed the aortic bifurcation and advanced to the left femoral head. Selective left lower extremity angiogram was then performed. This demonstrated very slow circulation time due to poor cardiac output, but the common femoral artery, profunda femoris artery, superficial femoral arteries were all normal.  The below-knee popliteal artery was occluded as were all 3 tibial vessels in the proximal segment.  This was an abrupt occlusion with poor collateral flow and very sluggish flow distally. It was felt that it was in the patient's best interest to proceed with intervention after these images to avoid a second procedure and a larger amount of contrast and fluoroscopy based off of the  findings from the initial angiogram. The patient was systemically heparinized and a 7 [place] [employer] sheath was then placed over the [employer] wire. I then used a Kumpe catheter and the advantage wire to easily navigate through the occlusion and down into the anterior tibial artery without difficulty confirming intraluminal flow in the distal anterior tibial artery and then exchanging for a V 18 wire.  Then brought the penumbra CAT 7 catheter on the field and made 3 passes in the left popliteal artery and  anterior tibial artery all the way down to the mid to distal anterior tibial artery.  This resulted in resolution of the thrombus in the left popliteal artery and anterior tibial arteries with inline flow to the foot.  I then came back up to the distal popliteal artery and redirected the V 18 wire down into the peroneal artery which was occluded proximally and then had some native disease in the mid to distal segment.  I then crossed these areas parking the wire down at the ankle.  Another pass with the penumbra CAT 7 catheter down the tibioperoneal trunk and then the proximal to mid peroneal artery resulted in resolution of the thrombus but there remained some native disease.  I elected to treat this with a 2.5 mm diameter by 30 cm length angioplasty balloon inflated to 8 atm for 1 minute.  Completion imaging showed two-vessel runoff without focal greater than 20% stenosis in the peroneal and anterior tibial arteries after intervention. I elected to terminate the procedure. The sheath was removed and StarClose closure device was deployed in the right femoral artery with excellent hemostatic result. The patient was taken to the recovery room in stable condition having tolerated the procedure well.  Findings:               Aortogram:  This demonstrated normal renal arteries and normal aorta and iliac segments without significant stenosis.              Left Lower Extremity: This demonstrated very slow circulation time due to poor cardiac output, but the common femoral artery, profunda femoris artery, superficial femoral arteries were all normal.  The below-knee popliteal artery was occluded as were all 3 tibial vessels in the proximal segment.  This was an abrupt occlusion with poor collateral flow and very sluggish flow distally.   Disposition: Patient was taken to the recovery room in stable condition having tolerated the procedure well.  Complications: None  Leotis Pain 12/25/2020 1:15 PM   This note  was created with Dragon Medical transcription system. Any errors in dictation are purely unintentional.

## 2020-12-25 NOTE — ED Notes (Signed)
Pt to OR.

## 2020-12-25 NOTE — Consult Note (Signed)
[name] Consult Note  MRN : [id number]  [name] is a 87 y.o. (1934/01/17) male who presents with chief complaint of  Chief Complaint  Patient presents with   Leg Pain   History of Present Illness:  87 year old gentleman with a past medical history of hypertension, heart failure, chronic kidney disease, diabetes type 2, hyperlipidemia, obstructive sleep apnea, BPH, abdominal aortic aneurysm, COPD recently admitted with sick sinus syndrome who  successfully underwent leadless pacemaker implantation on 12/19/2020 without apparent perioperative complications.  The patient endorses a history of waking up yesterday evening and on his way to use the bathroom experienced an acute sharp pain to the left lower extremity.  No alleviating factors which prompted him to seek medical attention in our emergency department.   Due to his renal function a CT angiogram was unable to be performed.  Venous duplex completed on 12/25/2020 was negative for DVT.  Physical exam was notable for a left lower extremity which is warm however transition to becoming cold mid calf.  Unable to palpate pedal pulses.  At this time, patient is unable to move/feel of his left foot.  Vascular surgery was consulted emergently by Dr. Starleen Blue for possible endovascular versus open intervention.  Current Facility-Administered Medications  Medication Dose Route Frequency Provider Last Rate Last Admin   0.9 %  sodium chloride infusion   Intravenous Continuous Yuna Pizzolato A, PA-C       heparin ADULT infusion 100 units/mL (25000 units/264mL)  1,650 Units/hr Intravenous Continuous Tawnya Crook, RPH 16.5 mL/hr at 12/25/20 0849 1,650 Units/hr at 12/25/20 0849   insulin aspart (novoLOG) injection 0-5 Units  0-5 Units Subcutaneous QHS Greene Diodato A, PA-C       insulin aspart (novoLOG) injection 0-9 Units  0-9 Units Subcutaneous Q4H Nilton Lave, Janalyn Harder, PA-C       Current  Outpatient Medications  Medication Sig Dispense Refill   albuterol (VENTOLIN HFA) 108 (90 Base) MCG/ACT inhaler Inhale 2 puffs into the lungs every 4 (four) hours as needed for wheezing or shortness of breath. 18 g 1   amLODipine (NORVASC) 2.5 MG tablet TAKE 1 TABLET(2.5 MG) BY MOUTH DAILY 30 tablet 2   apixaban (ELIQUIS) 2.5 MG TABS tablet Take 2.5 mg by mouth 2 (two) times daily.     atorvastatin (LIPITOR) 10 MG tablet Take 1 tablet (10 mg total) by mouth daily. 90 tablet 1   Capsaicin 0.025 % PTCH Place 1 patch onto the skin daily as needed (pain.).     FARXIGA 10 MG TABS tablet TAKE 1 TABLET(10 MG) BY MOUTH DAILY BEFORE BREAKFAST 30 tablet 2   ferrous sulfate 325 (65 FE) MG tablet Take 325 mg by mouth daily.      glucose blood (ONETOUCH VERIO) test strip USE ONCE DAILY. DX E11.65. 100 each 3   insulin glargine, 2 Unit Dial, (TOUJEO MAX SOLOSTAR) 300 UNIT/ML Solostar Pen Inject 14 Units into the skin daily. 3 mL 3   Insulin Pen Needle (PEN NEEDLES 3/16") 31G X 5 MM MISC To use daily with basal insulin injections 90 each 3   Lidocaine 4 % PTCH Place 1 patch onto the skin daily as needed (pain.).     metoprolol tartrate (LOPRESSOR) 25 MG tablet Take 1 tablet (25 mg total) by mouth 2 (two) times daily. 60 tablet 5   pantoprazole (PROTONIX) 40 MG tablet Take 1 tablet (40 mg total) by mouth daily. 30 tablet 1   Tiotropium Bromide Monohydrate (SPIRIVA RESPIMAT)  2.5 MCG/ACT AERS Inhale 2 puffs into the lungs daily. 4 g 3   trolamine salicylate (ASPERCREME) 10 % cream Apply 1 application topically 4 (four) times daily as needed for muscle pain.     vitamin B-12 (CYANOCOBALAMIN) 1000 MCG tablet Take 1 tablet (1,000 mcg total) by mouth daily. 30 tablet 0   Past Medical History:  Diagnosis Date   Atrial fibrillation (HCC)    CHF (congestive heart failure) ([place])    Diabetes ([place])    Hearing loss    High blood pressure    History of bladder problems    Kidney stones 11/22/2014   Prostate cancer  ([place])    Prostate cancer ([place])    Stroke [facility])    Past Surgical History:  Procedure Laterality Date   CATARACT EXTRACTION     COLONOSCOPY WITH PROPOFOL N/A 07/02/2019   Procedure: COLONOSCOPY WITH PROPOFOL;  Surgeon: Lin Landsman, MD;  Location: [facility] ENDOSCOPY;  Service: Gastroenterology;  Laterality: N/A;   IR CT HEAD LTD  08/27/2019   IR PERCUTANEOUS ART THROMBECTOMY/INFUSION INTRACRANIAL INC DIAG ANGIO  08/27/2019       IR PERCUTANEOUS ART THROMBECTOMY/INFUSION INTRACRANIAL INC DIAG ANGIO  08/27/2019   PACEMAKER LEADLESS INSERTION N/A 12/19/2020   Procedure: PACEMAKER LEADLESS INSERTION;  Surgeon: Isaias Cowman, MD;  Location: [facility];  Service: Cardiovascular;  Laterality: N/A;   PROSTATE CANCER     PROSTATE SURGERY     RADIOLOGY WITH ANESTHESIA N/A 08/27/2019   Procedure: IR WITH ANESTHESIA;  Surgeon: Luanne Bras, MD;  Location: [place];  Service: Radiology;  Laterality: N/A;   Social History Social History   Tobacco Use   Smoking status: Former    Years: 16.00    Types: Cigarettes   Smokeless tobacco: Never  Vaping Use   Vaping Use: Never used  Substance Use Topics   Alcohol use: No   Drug use: No   Family History Family History  Problem Relation Age of Onset   Colon cancer Mother    Diabetes Other    High blood pressure Other    Prostate cancer Brother    Diabetes Brother   Denies family history of peripheral artery disease, venous disease or renal disease.  No Known Allergies  REVIEW OF SYSTEMS (Negative unless checked)  Constitutional: [] Weight loss  [] Fever  [] Chills Cardiac: [] Chest pain   [] Chest pressure   [] Palpitations   [] Shortness of breath when laying flat   [] Shortness of breath at rest   [] Shortness of breath with exertion. Vascular:  [x] Pain in legs with walking   [x] Pain in legs at rest   [x] Pain in legs when laying flat   [] Claudication   [] Pain in feet when walking  [] Pain in feet at rest  [] Pain in feet when laying flat    [] History of DVT   [] Phlebitis   [] Swelling in legs   [] Varicose veins   [] Non-healing ulcers Pulmonary:   [] Uses home oxygen   [] Productive cough   [] Hemoptysis   [] Wheeze  [x] COPD   [] Asthma Neurologic:  [] Dizziness  [] Blackouts   [] Seizures   [] History of stroke   [] History of TIA  [] Aphasia   [] Temporary blindness   [] Dysphagia   [] Weakness or numbness in arms   [] Weakness or numbness in legs Musculoskeletal:  [x] Arthritis   [] Joint swelling   [] Joint pain   [] Low back pain Hematologic:  [] Easy bruising  [] Easy bleeding   [] Hypercoagulable state   [] Anemic  [] Hepatitis Gastrointestinal:  [] Blood in stool   []   Vomiting blood  [] Gastroesophageal reflux/heartburn   [] Difficulty swallowing. Genitourinary:  [x] Chronic kidney disease   [] Difficult urination  [] Frequent urination  [] Burning with urination   [] Blood in urine Skin:  [] Rashes   [] Ulcers   [] Wounds Psychological:  [] History of anxiety   []  History of major depression.  Physical Examination  Vitals:   12/25/20 0747 12/25/20 0750 12/25/20 0800 12/25/20 0900  BP:  (!) 171/107 (!) 143/91 (!) 142/88  Pulse:  78 71 72  Resp: 16 16 16 19   Temp: 97.7 F (36.5 C)     TempSrc: Oral     SpO2:  100% 97% 95%  Weight:      Height:       Body mass index is 34.04 kg/m. Gen:  WD/WN, NAD Head: [place]/AT, No temporalis wasting. Prominent temp pulse not noted. Ear/Nose/Throat: Hearing grossly intact, nares w/o erythema or drainage, oropharynx w/o Erythema/Exudate Eyes: Sclera non-icteric, conjunctiva clear Neck: Trachea midline.  No JVD.  Pulmonary:  Good air movement, respirations not labored, equal bilaterally.  Cardiac: Irregularly irregular Vascular:  Vessel Right Left  Radial Palpable Palpable  Ulnar Palpable Palpable  Brachial Palpable Palpable  Carotid Palpable, without bruit Palpable, without bruit  Aorta Not palpable N/A  Femoral Palpable Palpable  Popliteal Palpable Palpable  PT Nonpalpable  Nonpalpable   DP Nonpalpable   Nonpalpable    Left lower extremity: Thigh soft.  Calf relatively soft.  Extremity is warm however transitions approximately mid calf and becomes cooler.  Unable to palpate pedal pulses.  At this time, patient does not have motor/sensory left foot.  Left foot is pallor from the calf down when compared to the right.  Skin is intact.  No wounds.  Gastrointestinal: soft, non-tender/non-distended. No guarding/reflex.  Musculoskeletal: M/S 5/5 throughout.  Extremities without ischemic changes.  No deformity or atrophy. No edema. Neurologic: Sensation grossly intact in extremities.  Symmetrical.  Speech is fluent. Motor exam as listed above. Psychiatric: Judgment intact, Mood & affect appropriate for pt's clinical situation. Dermatologic: No rashes or ulcers noted.  No cellulitis or open wounds. Lymph : No Cervical, Axillary, or Inguinal lymphadenopathy.  CBC Lab Results  Component Value Date   WBC 8.1 12/25/2020   HGB 14.2 12/25/2020   HCT 46.1 12/25/2020   MCV 93.3 12/25/2020   PLT 183 12/25/2020   BMET    Component Value Date/Time   NA 140 12/25/2020 0009   NA 144 06/11/2019 0953   NA 138 10/13/2013 0415   K 4.0 12/25/2020 0009   K 4.0 10/13/2013 0415   CL 109 12/25/2020 0009   CL 106 10/13/2013 0415   CO2 23 12/25/2020 0009   CO2 27 10/13/2013 0415   GLUCOSE 263 (H) 12/25/2020 0009   GLUCOSE 97 10/13/2013 0415   BUN 29 (H) 12/25/2020 0009   BUN 37 (H) 06/11/2019 0953   BUN 22 (H) 10/13/2013 0415   CREATININE 2.06 (H) 12/25/2020 0009   CREATININE 1.65 (H) 10/13/2013 0415   CALCIUM 8.9 12/25/2020 0009   CALCIUM 8.2 (L) 10/13/2013 0415   GFRNONAA 31 (L) 12/25/2020 0009   GFRNONAA 39 (L) 10/13/2013 0415   GFRAA 28 (L) 10/12/2019 1035   GFRAA 45 (L) 10/13/2013 0415   Estimated Creatinine Clearance: 34.2 mL/min (A) (by C-G formula based on SCr of 2.06 mg/dL (H)).  COAG Lab Results  Component Value Date   INR 1.0 08/27/2019   INR 1.1 07/04/2019   INR 1.1 12/10/2018    Radiology DG Tibia/Fibula Left  Result Date: 12/25/2020  CLINICAL DATA:  Left leg pain EXAM: LEFT TIBIA AND FIBULA - 2 VIEW COMPARISON:  None. FINDINGS: There is no evidence of fracture or other focal bone lesions. Soft tissues are unremarkable. IMPRESSION: Negative. Electronically Signed   By: Fidela Salisbury M.D.   On: 12/25/2020 00:54   DG Ankle Complete Left  Result Date: 12/25/2020 CLINICAL DATA:  Left ankle pain EXAM: LEFT ANKLE COMPLETE - 3+ VIEW COMPARISON:  None. FINDINGS: Normal alignment. No acute fracture or dislocation. Ankle mortise is aligned. No ankle effusion. Soft tissues are unremarkable. Small plantar calcaneal spur. Prominent talar beak noted, a finding that can be associated with tarsal coalition though this is not well delineated on this examination. IMPRESSION: No acute fracture or dislocation. Electronically Signed   By: Fidela Salisbury M.D.   On: 12/25/2020 00:57   EP PPM/ICD IMPLANT  Result Date: 12/19/2020 Successful implantation Med[employer]onic Micra AV leadless pacemaker   US Venous Img Lower Unilateral Left  Result Date: 12/25/2020 CLINICAL DATA:  Left lower extremity pain and swelling EXAM: LEFT LOWER EXTREMITY VENOUS DOPPLER ULTRASOUND TECHNIQUE: Gray-scale sonography with graded compression, as well as color Doppler and duplex ultrasound were performed to evaluate the lower extremity deep venous systems from the level of the common femoral vein and including the common femoral, femoral, profunda femoral, popliteal and calf veins including the posterior tibial, peroneal and gastrocnemius veins when visible. The superficial great saphenous vein was also interrogated. Spectral Doppler was utilized to evaluate flow at rest and with distal augmentation maneuvers in the common femoral, femoral and popliteal veins. COMPARISON:  None. FINDINGS: Contralateral Common Femoral Vein: Respiratory phasicity is normal and symmetric with the symptomatic side. No evidence of thrombus.  Normal compressibility. Common Femoral Vein: No evidence of thrombus. Normal compressibility, respiratory phasicity and response to augmentation. Saphenofemoral Junction: No evidence of thrombus. Normal compressibility and flow on color Doppler imaging. Profunda Femoral Vein: No evidence of thrombus. Normal compressibility and flow on color Doppler imaging. Femoral Vein: No evidence of thrombus. Normal compressibility, respiratory phasicity and response to augmentation. Popliteal Vein: No evidence of thrombus. Normal compressibility, respiratory phasicity and response to augmentation. Calf Veins: No evidence of thrombus. Normal compressibility and flow on color Doppler imaging. Superficial Great Saphenous Vein: No evidence of thrombus. Normal compressibility. Venous Reflux:  None. Other Findings:  None. IMPRESSION: No evidence of deep venous thrombosis. Electronically Signed   By: Inez Catalina M.D.   On: 12/25/2020 01:01    Assessment/Plan The patient is a 87 year old male with multi medical issues including recent pacemaker insertion for chronic A. fib who presents to [facility] emergency department complaining of acute left lower extremity pain found to be ischemic  1.  Ischemic left lower extremity: On exam, unable to palpate pedal pulses.  The foot is cooler at the calf.  At this time, motors and sensory seems to be lost to the left foot.  Patient with chronic A. fib discharged on Eliquis.  States that he has been compliant.  Recommend emergent left lower extremity angiogram possible intervention and attempt to revascularize the leg.  Procedure, risks and benefits were explained to the patient.  All questions were answered.  The patient wishes to proceed.  The patient asked me to call his wife who was at home and explained the situation.  I called the patient's wife and also discussed the procedure, risks and benefits.  I answered her questions.  She also agreed to move forward.   We will plan on this  emergently.  Heparin has been started.  2.  COPD/sleep apnea: Will try to limit the amount of sedation used during the procedure. Patient will be monitored during the procedure  3.  Chronic kidney disease: We will try to limit the amount of contrast used  Started normal saline at 75 for some hydration  4) diabetes/hyperlipidemia: Risk factors for atherosclerotic disease.  Discussed with Dr. Mayme Genta, PA-C 12/25/2020 9:53 AM  This note was created with Dragon medical transcription system.  Any error is purely unintentional.

## 2020-12-25 NOTE — Progress Notes (Signed)
Dr. Lucky Cowboy at bedside: spoke with wife re: procedural results. Pt. Sleeping at present. Right groin PAD intact; old ecchymosis at right groin site (from admit last week for pacemaker). Wife verbalized understanding of conversation with MD.

## 2020-12-25 NOTE — Progress Notes (Signed)
Dr. Blaine Ham[facility] at bedside, speaking with pt. Wife.

## 2020-12-25 NOTE — ED Notes (Signed)
Provided phone for pt to speak with wife.

## 2020-12-25 NOTE — Progress Notes (Signed)
[place] for heparin Indication:  arterial thrombosis  No Known Allergies  Patient Measurements: Height: 6' (182.9 cm) Weight: 113.9 kg (251 lb) IBW/kg (Calculated) : 77.6 Heparin Dosing Weight: 102 kg  Vital Signs: Temp: 97.7 F (36.5 C) (12/05 0747) Temp Source: Oral (12/05 0747) BP: 171/107 (12/05 0750) Pulse Rate: 78 (12/05 0750)  Labs: Recent Labs    12/25/20 0009  HGB 14.2  HCT 46.1  PLT 183  CREATININE 2.06*    Estimated Creatinine Clearance: 34.2 mL/min (A) (by C-G formula based on SCr of 2.06 mg/dL (H)).   Medical History: Past Medical History:  Diagnosis Date   Atrial fibrillation (HCC)    CHF (congestive heart failure) ([place])    Diabetes ([place])    Hearing loss    High blood pressure    History of bladder problems    Kidney stones 11/22/2014   Prostate cancer [facility])    Prostate cancer ([name])    Stroke [facility])      Assessment: 87 year old male on Eliquis PTA with last dose 12/4 am. Pharmacy consult to manage heparin for arterial thrombosis.  Goal of Therapy:  Heparin level 0.3-0.7 units/ml aPTT 66-102 seconds Monitor platelets by anticoagulation protocol: Yes   Plan:  --Heparin 6000 unit bolus --Heparin infusion at 1650 units/hr --Check aPTT ~ 8 hrs after start of infusion --Follow aPTT until correlation with HL --CBC daily while on heparin  Tawnya Crook, PharmD, BCPS Clinical Pharmacist 12/25/2020 8:46 AM

## 2020-12-25 NOTE — H&P (Signed)
History and Physical    [name] WRU:[id number] DOB: 07/28/1933 DOA: 12/25/2020  Referring MD/NP/PA:   PCP: Glenn Guise, MD   Patient coming from:  The patient is coming from home.    Chief Complaint: left leg pain  HPI: [name] is a 87 y.o. male with medical history significant of hypertension, hyperlipidemia, diabetes mellitus, stroke, GERD, prostate cancer, kidney stone, hard of hearing, dCHF, atrial fibrillation on Eliquis, AAA, CKD-3B, OSA on CPAP, SSS (s/p of pacemaker on 12/20/2020), anemia, who presents with left leg pain.  Patient states that he is left lower leg pain started suddenly last night when he got up to use bathroom.  The pain is constant, severe, sharp, nonradiating. He has inability to ambulate left leg. Per his wife at beside, patient has mild cough, no shortness of breath or chest pain.  No nausea vomiting, diarrhea or abdominal pain.  No symptoms of UTI. Per EDP, pt was seen in triage initially there was documented 1+ DP and PT pulses, but by the time of Dr. Kendrick Ranch evaluation of patient, no palpable or dopplerable pulses in the left leg. Due to concerning for acute limb ischemia, [facility] is consulted urgently.  Urgent procedure was done by Dr. Lucky Cowboy as below:  Procedure(s) Performed:             1.  Ultrasound guidance for vascular access right femoral artery             2.  Catheter placement into left SFA from right femoral approach             3.  Aortogram and selective left lower extremity angiogram             4.  Mechanical thrombectomy to the left popliteal artery, left anterior tibial artery, and left peroneal artery with the penumbra CAT 7 device             5.  Percutaneous transluminal angioplasty of the left peroneal artery with 2.5 mm diameter by 30 cm length angioplasty balloon             6.  StarClose closure device right femoral artery  ED Course: pt was found to have CK level 91, negative COVID PCR, WBC 8.1, renal function close to  baseline, temperature normal, blood pressure 147/105, heart rate 104, RR 21, oxygen saturation 94% on room air.  Lower extremity Doppler negative for DVT.  X-ray of left leg and left ankle negative for bony fracture.  Patient is admitted to telemetry bed as inpatient.  Review of Systems:   General: no fevers, chills, no body weight gain, has fatigue HEENT: no blurry vision, hearing changes or sore throat Respiratory: no dyspnea, has coughing, no wheezing CV: no chest pain, no palpitations GI: no nausea, vomiting, abdominal pain, diarrhea, constipation GU: no dysuria, burning on urination, increased urinary frequency, hematuria  Ext: has mild leg edema. Has left leg pain Neuro: no unilateral weakness, numbness, or tingling, no vision change or hearing loss Skin: no rash, no skin tear. MSK: No muscle spasm, no deformity, no limitation of range of movement in spin Heme: No easy bruising.  Travel history: No recent long distant travel.  Allergy: No Known Allergies  Past Medical History:  Diagnosis Date   Atrial fibrillation (HCC)    CHF (congestive heart failure) (HCC)    Diabetes ([place])    Hearing loss    High blood pressure    History of bladder problems  Kidney stones 11/22/2014   Prostate cancer [facility])    Prostate cancer ([place])    Stroke [facility])     Past Surgical History:  Procedure Laterality Date   CATARACT EXTRACTION     COLONOSCOPY WITH PROPOFOL N/A 07/02/2019   Procedure: COLONOSCOPY WITH PROPOFOL;  Surgeon: Lin Landsman, MD;  Location: [facility] ENDOSCOPY;  Service: Gastroenterology;  Laterality: N/A;   IR CT HEAD LTD  08/27/2019   IR PERCUTANEOUS ART THROMBECTOMY/INFUSION INTRACRANIAL INC DIAG ANGIO  08/27/2019       IR PERCUTANEOUS ART THROMBECTOMY/INFUSION INTRACRANIAL INC DIAG ANGIO  08/27/2019   PACEMAKER LEADLESS INSERTION N/A 12/19/2020   Procedure: PACEMAKER LEADLESS INSERTION;  Surgeon: Isaias Cowman, MD;  Location: [facility] CV LAB;  Service:  Cardiovascular;  Laterality: N/A;   PROSTATE CANCER     PROSTATE SURGERY     RADIOLOGY WITH ANESTHESIA N/A 08/27/2019   Procedure: IR WITH ANESTHESIA;  Surgeon: Luanne Bras, MD;  Location: [facility];  Service: Radiology;  Laterality: N/A;    Social History:  reports that he has quit smoking. His smoking use included cigarettes. He has never used smokeless tobacco. He reports that he does not drink alcohol and does not use drugs.  Family History:  Family History  Problem Relation Age of Onset   Colon cancer Mother    Diabetes Other    High blood pressure Other    Prostate cancer Brother    Diabetes Brother      Prior to Admission medications   Medication Sig Start Date End Date Taking? Authorizing Provider  albuterol (VENTOLIN HFA) 108 (90 Base) MCG/ACT inhaler Inhale 2 puffs into the lungs every 4 (four) hours as needed for wheezing or shortness of breath. 05/10/20   Luiz Ochoa, NP  amLODipine (NORVASC) 2.5 MG tablet TAKE 1 TABLET(2.5 MG) BY MOUTH DAILY 11/07/20   McDonough, Si Gaul, PA-C  apixaban (ELIQUIS) 2.5 MG TABS tablet Take 2.5 mg by mouth 2 (two) times daily.    [provider]  atorvastatin (LIPITOR) 10 MG tablet Take 1 tablet (10 mg total) by mouth daily. 12/08/20   Jonetta Osgood, NP  Capsaicin 0.025 % PTCH Place 1 patch onto the skin daily as needed (pain.).    [provider]  FARXIGA 10 MG TABS tablet TAKE 1 TABLET(10 MG) BY MOUTH DAILY BEFORE BREAKFAST 11/07/20   McDonough, Lauren K, PA-C  ferrous sulfate 325 (65 FE) MG tablet Take 325 mg by mouth daily.     [provider]  glucose blood (ONETOUCH VERIO) test strip USE ONCE DAILY. DX E11.65. 12/08/20   Abernathy, Yetta Flock, NP  insulin glargine, 2 Unit Dial, (TOUJEO MAX SOLOSTAR) 300 UNIT/ML Solostar Pen Inject 14 Units into the skin daily. 11/17/20   Jonetta Osgood, NP  Insulin Pen Needle (PEN NEEDLES 3/16") 31G X 5 MM MISC To use daily with basal insulin injections 11/17/20    Abernathy, Yetta Flock, NP  Lidocaine 4 % PTCH Place 1 patch onto the skin daily as needed (pain.).    [provider]  metoprolol tartrate (LOPRESSOR) 25 MG tablet Take 1 tablet (25 mg total) by mouth 2 (two) times daily. 12/08/20   Jonetta Osgood, NP  pantoprazole (PROTONIX) 40 MG tablet Take 1 tablet (40 mg total) by mouth daily. 12/08/20   Jonetta Osgood, NP  Tiotropium Bromide Monohydrate (SPIRIVA RESPIMAT) 2.5 MCG/ACT AERS Inhale 2 puffs into the lungs daily. 12/22/20   Glenn Guise, MD  trolamine salicylate (ASPERCREME) 10 % cream Apply 1 application topically  4 (four) times daily as needed for muscle pain.    [provider]  vitamin B-12 (CYANOCOBALAMIN) 1000 MCG tablet Take 1 tablet (1,000 mcg total) by mouth daily. 12/08/20   Jonetta Osgood, NP    Physical Exam: Vitals:   12/25/20 1345 12/25/20 1400 12/25/20 1415 12/25/20 1500  BP: (!) 134/93 134/87    Pulse: 69 71 72 80  Resp: 14 17 16    Temp:      TempSrc:      SpO2: 96% 98% 96%   Weight:      Height:       General: Not in acute distress HEENT:       Eyes: PERRL, EOMI, no scleral icterus.       ENT: No discharge from the ears and nose, no pharynx injection, no tonsillar enlargement.        Neck: No JVD, no bruit, no mass felt. Heme: No neck lymph node enlargement. Cardiac: S1/S2, RRR, No murmurs, No gallops or rubs. Respiratory: No rales, wheezing, rhonchi or rubs. GI: Soft, nondistended, nontender, no rebound pain, no organomegaly, BS present. GU: No hematuria Ext: has trace leg edema bilaterally. Left leg is cool. Has 1+DP pulse on the right leg, PT pulse not palpable in right leg. No palpable DP/PT pulse on the left leg. Musculoskeletal: No joint deformities, No joint redness or warmth, no limitation of ROM in spin. Skin: No rashes.  Neuro: Alert, oriented X3, cranial nerves II-XII grossly intact, moves all extremities normally. Psych: Patient is not psychotic, no suicidal or hemocidal  ideation.  Labs on Admission: I have personally reviewed following labs and imaging studies  CBC: Recent Labs  Lab 12/25/20 0009  WBC 8.1  NEUTROABS 4.4  HGB 14.2  HCT 46.1  MCV 93.3  PLT 465   Basic Metabolic Panel: Recent Labs  Lab 12/20/20 0416 12/25/20 0009  NA 137 140  K 3.9 4.0  CL 107 109  CO2 25 23  GLUCOSE 140* 263*  BUN 26* 29*  CREATININE 1.59* 2.06*  CALCIUM 8.8* 8.9   GFR: Estimated Creatinine Clearance: 34.2 mL/min (A) (by C-G formula based on SCr of 2.06 mg/dL (H)). Liver Function Tests: Recent Labs  Lab 12/25/20 0009  AST 19  ALT 22  ALKPHOS 135*  BILITOT 0.5  PROT 7.1  ALBUMIN 3.4*   No results for input(s): LIPASE, AMYLASE in the last 168 hours. No results for input(s): AMMONIA in the last 168 hours. Coagulation Profile: Recent Labs  Lab 12/25/20 0834  INR 1.1   Cardiac Enzymes: Recent Labs  Lab 12/25/20 0834  CKTOTAL 91   BNP (last 3 results) No results for input(s): PROBNP in the last 8760 hours. HbA1C: No results for input(s): HGBA1C in the last 72 hours. CBG: Recent Labs  Lab 12/19/20 1616 12/19/20 1953 12/20/20 0902 12/25/20 1115 12/25/20 1319  GLUCAP 200* 201* 143* 139* 136*   Lipid Profile: No results for input(s): CHOL, HDL, LDLCALC, TRIG, CHOLHDL, LDLDIRECT in the last 72 hours. Thyroid Function Tests: No results for input(s): TSH, T4TOTAL, FREET4, T3FREE, THYROIDAB in the last 72 hours. Anemia Panel: No results for input(s): VITAMINB12, FOLATE, FERRITIN, TIBC, IRON, RETICCTPCT in the last 72 hours. Urine analysis:    Component Value Date/Time   COLORURINE YELLOW (A) 07/01/2019 0506   APPEARANCEUR Clear 10/15/2019 1544   LABSPEC 1.016 07/01/2019 0506   LABSPEC 1.015 10/12/2013 1445   PHURINE 5.0 07/01/2019 0506   GLUCOSEU Negative 10/15/2019 1544   GLUCOSEU 50 mg/dL 10/12/2013 1445  HGBUR NEGATIVE 07/01/2019 0506   BILIRUBINUR Negative 10/15/2019 1544   BILIRUBINUR Negative 10/12/2013 1445   KETONESUR  NEGATIVE 07/01/2019 0506   PROTEINUR Negative 10/15/2019 1544   PROTEINUR NEGATIVE 07/01/2019 0506   UROBILINOGEN 0.2 06/29/2019 1556   NITRITE Negative 10/15/2019 1544   NITRITE NEGATIVE 07/01/2019 0506   LEUKOCYTESUR Negative 10/15/2019 1544   LEUKOCYTESUR NEGATIVE 07/01/2019 0506   LEUKOCYTESUR Negative 10/12/2013 1445   Sepsis Labs: @LABRCNTIP (procalcitonin:4,lacticidven:4) ) Recent Results (from the past 240 hour(s))  Resp Panel by RT-PCR (Flu A&B, Covid) Nasopharyngeal Swab     Status: None   Collection Time: 12/25/20  8:34 AM   Specimen: Nasopharyngeal Swab; Nasopharyngeal(NP) swabs in vial transport medium  Result Value Ref Range Status   SARS Coronavirus 2 by RT PCR NEGATIVE NEGATIVE Final    Comment: (NOTE) SARS-CoV-2 target nucleic acids are NOT DETECTED.  The SARS-CoV-2 RNA is generally detectable in upper respiratory specimens during the acute phase of infection. The lowest concentration of SARS-CoV-2 viral copies this assay can detect is 138 copies/mL. A negative result does not preclude SARS-Cov-2 infection and should not be used as the sole basis for treatment or other patient management decisions. A negative result may occur with  improper specimen collection/handling, submission of specimen other than nasopharyngeal swab, presence of viral mutation(s) within the areas targeted by this assay, and inadequate number of viral copies(<138 copies/mL). A negative result must be combined with clinical observations, patient history, and epidemiological information. The expected result is Negative.  Fact Sheet for Patients:  EntrepreneurPulse.com.au  Fact Sheet for Healthcare Providers:  IncredibleEmployment.be  This test is no t yet approved or cleared by the [place] FDA and  has been authorized for detection and/or diagnosis of SARS-CoV-2 by FDA under an Emergency Use Authorization (EUA). This EUA will remain  in effect  (meaning this test can be used) for the duration of the COVID-19 declaration under Section 564(b)(1) of the Act, 21 U.S.C.section 360bbb-3(b)(1), unless the authorization is terminated  or revoked sooner.       Influenza A by PCR NEGATIVE NEGATIVE Final   Influenza B by PCR NEGATIVE NEGATIVE Final    Comment: (NOTE) The Xpert Xpress SARS-CoV-2/FLU/RSV plus assay is intended as an aid in the diagnosis of influenza from Nasopharyngeal swab specimens and should not be used as a sole basis for treatment. Nasal washings and aspirates are unacceptable for Xpert Xpress SARS-CoV-2/FLU/RSV testing.  Fact Sheet for Patients: EntrepreneurPulse.com.au  Fact Sheet for Healthcare Providers: IncredibleEmployment.be  This test is not yet approved or cleared by the [place] FDA and has been authorized for detection and/or diagnosis of SARS-CoV-2 by FDA under an Emergency Use Authorization (EUA). This EUA will remain in effect (meaning this test can be used) for the duration of the COVID-19 declaration under Section 564(b)(1) of the Act, 21 U.S.C. section 360bbb-3(b)(1), unless the authorization is terminated or revoked.  Performed at [facility], [address] [place], [place] [address]      Radiological Exams on Admission: DG Tibia/Fibula Left  Result Date: 12/25/2020 CLINICAL DATA:  Left leg pain EXAM: LEFT TIBIA AND FIBULA - 2 VIEW COMPARISON:  None. FINDINGS: There is no evidence of fracture or other focal bone lesions. Soft tissues are unremarkable. IMPRESSION: Negative. Electronically Signed   By: Fidela Salisbury M.D.   On: 12/25/2020 00:54   DG Ankle Complete Left  Result Date: 12/25/2020 CLINICAL DATA:  Left ankle pain EXAM: LEFT ANKLE COMPLETE - 3+ VIEW COMPARISON:  None. FINDINGS:  Normal alignment. No acute fracture or dislocation. Ankle mortise is aligned. No ankle effusion. Soft tissues are unremarkable. Small plantar calcaneal  spur. Prominent talar beak noted, a finding that can be associated with tarsal coalition though this is not well delineated on this examination. IMPRESSION: No acute fracture or dislocation. Electronically Signed   By: Fidela Salisbury M.D.   On: 12/25/2020 00:57   PERIPHERAL VASCULAR CATHETERIZATION  Result Date: 12/25/2020 See surgical note for result.  US Venous Img Lower Unilateral Left  Result Date: 12/25/2020 CLINICAL DATA:  Left lower extremity pain and swelling EXAM: LEFT LOWER EXTREMITY VENOUS DOPPLER ULTRASOUND TECHNIQUE: Gray-scale sonography with graded compression, as well as color Doppler and duplex ultrasound were performed to evaluate the lower extremity deep venous systems from the level of the common femoral vein and including the common femoral, femoral, profunda femoral, popliteal and calf veins including the posterior tibial, peroneal and gastrocnemius veins when visible. The superficial great saphenous vein was also interrogated. Spectral Doppler was utilized to evaluate flow at rest and with distal augmentation maneuvers in the common femoral, femoral and popliteal veins. COMPARISON:  None. FINDINGS: Contralateral Common Femoral Vein: Respiratory phasicity is normal and symmetric with the symptomatic side. No evidence of thrombus. Normal compressibility. Common Femoral Vein: No evidence of thrombus. Normal compressibility, respiratory phasicity and response to augmentation. Saphenofemoral Junction: No evidence of thrombus. Normal compressibility and flow on color Doppler imaging. Profunda Femoral Vein: No evidence of thrombus. Normal compressibility and flow on color Doppler imaging. Femoral Vein: No evidence of thrombus. Normal compressibility, respiratory phasicity and response to augmentation. Popliteal Vein: No evidence of thrombus. Normal compressibility, respiratory phasicity and response to augmentation. Calf Veins: No evidence of thrombus. Normal compressibility and flow on color  Doppler imaging. Superficial Great Saphenous Vein: No evidence of thrombus. Normal compressibility. Venous Reflux:  None. Other Findings:  None. IMPRESSION: No evidence of deep venous thrombosis. Electronically Signed   By: Inez Catalina M.D.   On: 12/25/2020 01:01     EKG: I have personally reviewed.  Atrial fibrillation, QTC 462, poor R wave progression, LAD, with pacer spikes  Assessment/Plan Principal Problem:   Ischemic leg_left Active Problems:   Essential hypertension   Hyperlipidemia, mixed   OSA (obstructive sleep apnea)   Chronic systolic heart failure (HCC)   Chronic obstructive pulmonary disease (HCC)   Chronic a-fib (HCC)   Sick sinus syndrome (HCC)   CKD (chronic kidney disease), stage IIIb   Type II diabetes mellitus with renal manifestations ([place])   Stroke (HCC)   Ischemic leg_left: Dr. Lucky Cowboy of [facility] was consulted, who did urgent vascular procedure as listed above.  Patient was initially treated with IV heparin.  After the procedure, patient is started on Aggrastat.  Per Dr. Lucky Cowboy, tomorrow morning need to start patient on Eliquis 5 mg twice daily -Admitted to med-surg bed as inpt -As needed Percocet, Tylenol for pain  Essential hypertension -IV hydralazine as needed -Amlodipine, metoprolol  Hyperlipidemia, mixed -Lipitor  OSA (obstructive sleep apnea) -CPAP  Chronic systolic heart failure ([place]): 2D echo on 08/28/2019 showed EF 60-85%.  Patient has bilateral leg edema, but no JVD.  No shortness of breath.  CHF seem to be compensated. -Check BNP  Chronic obstructive pulmonary disease (HCC): Stable -Bronchodilators  Chronic a-fib (HCC) -We will continue Eliquis tomorrow, but change the dose from 2.5mg  to 5 mg twice daily per Dr. Lucky Cowboy  Sick sinus syndrome [facility]) -S/p of recent pacemaker placement  CKD (chronic kidney disease), stage IIIb: Baseline creatinine  1.8-2.2 recently.  His creatinine is 2.06, BUN 29, close to baseline -Follow-up with BMP  Type II diabetes  mellitus with renal manifestations [facility]): Patient is taking [place] and glargine insulin 10 unit daily -Sliding scale insulin -Glargine insulin 7 unit daily  Hx of Stroke ([place]) -Continue Eliquis tomorrow    DVT ppx: on Aggrastat tonight, restart Eliquis tomorrow per vascular surgeon Code Status: Full code Family Communication: Yes, patient's wife   at bed side.  Disposition Plan:  Anticipate discharge back to previous environment Consults called: Dr. Lucky Cowboy of vascular surgery Admission status and Level of care: Telemetry Medical:   as inpt      Status is: Inpatient  Remains inpatient appropriate because: Patient had multiple committees, presents with ischemic left leg, required urgent vascular procedure.  His presentation is highly complicated.  Patient is at high risk of deteriorating.  Need to be treated in hospital for at least 2 days.         Date of Service 12/25/2020    Ivor Costa [facility]   If 7PM-7AM, please contact night-coverage [email or web address] 12/25/2020, 3:34 PM

## 2020-12-25 NOTE — ED Provider Notes (Signed)
[facility]  ____________________________________________   Event Date/Time   First MD Initiated Contact with Patient 12/25/20 [id number]     (approximate)  I have reviewed the triage vital signs and the nursing notes.   HISTORY  Chief Complaint Leg Pain    HPI [name] is a 87 y.o. male with past medical history of atrial fibrillation on Eliquis, is post pacemaker placement CHF, hypertension, prostate cancer, CVA who presents with leg pain.  Symptoms started acutely yesterday.  He endorses swelling and pain in the leg as well as inability to ambulate on the leg.  No history of similar.  Denies chest, lower back pain.  No numbness or tingling.         Past Medical History:  Diagnosis Date   Atrial fibrillation (HCC)    CHF (congestive heart failure) ([place])    Diabetes ([place])    Hearing loss    High blood pressure    History of bladder problems    Kidney stones 11/22/2014   Prostate cancer [facility])    Prostate cancer ([place])    Stroke [facility])     Patient Active Problem List   Diagnosis Date Noted   Sick sinus syndrome ([place]) 12/19/2020   Retroperitoneal lymphadenopathy 04/26/2020   Other specified disorders of kidney and ureter 01/02/2020   Abdominal distension 10/31/2019   Atherosclerosis of aorta ([place]) 10/15/2019   Androgen deprivation therapy 10/13/2019   Other constipation 08/29/2019   Acute ischemic left MCA stroke ([place]) 08/27/2019   Normocytic anemia 08/05/2019   Prostate cancer ([place]) 08/05/2019   Goals of care, counseling/discussion 07/22/2019   Fecal impaction ([place]) 07/11/2019   Lower GI bleeding 06/30/2019   Diverticulosis of colon 02/07/2019   Chronic obstructive pulmonary disease ([place])    Chronic a-fib (HCC)    Type 2 diabetes mellitus with hyperglycemia ([place]) 10/23/2018   Acute or subacute form of ischemic heart disease ([place]) 10/23/2018   Need for vaccination against Streptococcus pneumoniae using pneumococcal conjugate vaccine 13  10/23/2018   Chronic mesenteric ischemia ([place]) 07/28/2018   Abdominal aortic aneurysm without rupture 07/28/2018   Primary osteoarthritis of left hip 02/12/2018   Bilateral hearing loss due to cerumen impaction 82/99/3716   Chronic systolic heart failure ([place]) 08/19/2017   Atrial fibrillation with RVR ([place]) 08/02/2017   Acute on chronic congestive heart failure ([place]) 08/02/2017   Prostatic hypertrophy 07/20/2017   Dysuria 07/20/2017   Hyperpigmentation 04/10/2017   Uncontrolled type 2 diabetes mellitus with hyperglycemia ([place]) 03/24/2017   Encounter for general adult medical examination with abnormal findings 03/24/2017   Chest pain 05/04/2016   Elevated troponin 05/04/2016   Essential hypertension 07/06/2014   Cough 03/21/2014   OSA (obstructive sleep apnea) 03/21/2014   Paroxysmal A-fib ([place]) 03/21/2014   Hx of adenomatous colonic polyps 06/28/2013   Liver cyst 06/28/2013   Chronic kidney disease 06/08/2013   Type 2 diabetes mellitus with stage 3b chronic kidney disease, without long-term current use of insulin ([place]) 06/08/2013   Hyperlipidemia, mixed 06/08/2013   Valvular heart disease 06/08/2013    Past Surgical History:  Procedure Laterality Date   CATARACT EXTRACTION     COLONOSCOPY WITH PROPOFOL N/A 07/02/2019   Procedure: COLONOSCOPY WITH PROPOFOL;  Surgeon: Lin Landsman, MD;  Location: [facility] ENDOSCOPY;  Service: Gastroenterology;  Laterality: N/A;   IR CT HEAD LTD  08/27/2019   IR PERCUTANEOUS ART THROMBECTOMY/INFUSION INTRACRANIAL INC DIAG ANGIO  08/27/2019       IR PERCUTANEOUS ART THROMBECTOMY/INFUSION INTRACRANIAL INC  DIAG ANGIO  08/27/2019   PACEMAKER LEADLESS INSERTION N/A 12/19/2020   Procedure: PACEMAKER LEADLESS INSERTION;  Surgeon: Isaias Cowman, MD;  Location: [facility] CV LAB;  Service: Cardiovascular;  Laterality: N/A;   PROSTATE CANCER     PROSTATE SURGERY     RADIOLOGY WITH ANESTHESIA N/A 08/27/2019   Procedure: IR WITH ANESTHESIA;  Surgeon:  Luanne Bras, MD;  Location: [facility];  Service: Radiology;  Laterality: N/A;    Prior to Admission medications   Medication Sig Start Date End Date Taking? Authorizing Provider  albuterol (VENTOLIN HFA) 108 (90 Base) MCG/ACT inhaler Inhale 2 puffs into the lungs every 4 (four) hours as needed for wheezing or shortness of breath. 05/10/20   Luiz Ochoa, NP  amLODipine (NORVASC) 2.5 MG tablet TAKE 1 TABLET(2.5 MG) BY MOUTH DAILY 11/07/20   McDonough, Si Gaul, PA-C  apixaban (ELIQUIS) 2.5 MG TABS tablet Take 2.5 mg by mouth 2 (two) times daily.    [provider]  atorvastatin (LIPITOR) 10 MG tablet Take 1 tablet (10 mg total) by mouth daily. 12/08/20   Jonetta Osgood, NP  Capsaicin 0.025 % PTCH Place 1 patch onto the skin daily as needed (pain.).    [provider]  FARXIGA 10 MG TABS tablet TAKE 1 TABLET(10 MG) BY MOUTH DAILY BEFORE BREAKFAST 11/07/20   McDonough, Lauren K, PA-C  ferrous sulfate 325 (65 FE) MG tablet Take 325 mg by mouth daily.     [provider]  glucose blood (ONETOUCH VERIO) test strip USE ONCE DAILY. DX E11.65. 12/08/20   Abernathy, Yetta Flock, NP  insulin glargine, 2 Unit Dial, (TOUJEO MAX SOLOSTAR) 300 UNIT/ML Solostar Pen Inject 14 Units into the skin daily. 11/17/20   Jonetta Osgood, NP  Insulin Pen Needle (PEN NEEDLES 3/16") 31G X 5 MM MISC To use daily with basal insulin injections 11/17/20   Abernathy, Yetta Flock, NP  Lidocaine 4 % PTCH Place 1 patch onto the skin daily as needed (pain.).    [provider]  metoprolol tartrate (LOPRESSOR) 25 MG tablet Take 1 tablet (25 mg total) by mouth 2 (two) times daily. 12/08/20   Jonetta Osgood, NP  pantoprazole (PROTONIX) 40 MG tablet Take 1 tablet (40 mg total) by mouth daily. 12/08/20   Jonetta Osgood, NP  Tiotropium Bromide Monohydrate (SPIRIVA RESPIMAT) 2.5 MCG/ACT AERS Inhale 2 puffs into the lungs daily. 12/22/20   Lavera Guise, MD  trolamine salicylate (ASPERCREME) 10 %  cream Apply 1 application topically 4 (four) times daily as needed for muscle pain.    [provider]  vitamin B-12 (CYANOCOBALAMIN) 1000 MCG tablet Take 1 tablet (1,000 mcg total) by mouth daily. 12/08/20   Jonetta Osgood, NP    Allergies Patient has no known allergies.  Family History  Problem Relation Age of Onset   Colon cancer Mother    Diabetes Other    High blood pressure Other    Prostate cancer Brother    Diabetes Brother     Social History Social History   Tobacco Use   Smoking status: Former    Years: 16.00    Types: Cigarettes   Smokeless tobacco: Never  Vaping Use   Vaping Use: Never used  Substance Use Topics   Alcohol use: No   Drug use: No    Review of Systems   Review of Systems  Respiratory:  Negative for chest tightness.   Cardiovascular:  Negative for chest pain.  Gastrointestinal:  Negative for abdominal pain.  Musculoskeletal:  Positive  for arthralgias, gait problem and myalgias. Negative for back pain.  All other systems reviewed and are negative.  Physical Exam Updated Vital Signs BP (!) 147/105   Pulse (!) 104   Temp 97.7 F (36.5 C) (Oral)   Resp (!) 21   Ht 6' (1.829 m)   Wt 113.9 kg   SpO2 98%   BMI 34.04 kg/m   Physical Exam Vitals and nursing note reviewed.  Constitutional:      Appearance: Normal appearance.     Comments: Patient is uncomfortable appearing  HENT:     Head: Normocephalic and atraumatic.  Eyes:     General: No scleral icterus.    Conjunctiva/sclera: Conjunctivae normal.  Pulmonary:     Effort: Pulmonary effort is normal. No respiratory distress.     Breath sounds: Normal breath sounds. No wheezing.  Musculoskeletal:        General: No deformity or signs of injury.     Cervical back: Normal range of motion.     Comments: Left leg is cool to touch compared to right 2+ DP pulse on the right, no palpable or dopplerable PT on the right No dopplerable Pt or DP on the L, dopplerable popliteal    Able to move toes on the left foot but decreased strength with plantar flexion dorsiflexion compared to the right, decreased sensation in the first dorsal webspace subjectively on the left compared to right  Skin:    Coloration: Skin is not jaundiced or pale.  Neurological:     General: No focal deficit present.     Mental Status: He is alert and oriented to person, place, and time. Mental status is at baseline.  Psychiatric:        Mood and Affect: Mood normal.        Behavior: Behavior normal.     LABS (all labs ordered are listed, but only abnormal results are displayed)  Labs Reviewed  COMPREHENSIVE METABOLIC PANEL - Abnormal; Notable for the following components:      Result Value   Glucose, Bld 263 (*)    BUN 29 (*)    Creatinine, Ser 2.06 (*)    Albumin 3.4 (*)    Alkaline Phosphatase 135 (*)    GFR, Estimated 31 (*)    All other components within normal limits  RESP PANEL BY RT-PCR (FLU A&B, COVID) ARPGX2  CBC WITH DIFFERENTIAL/PLATELET  CK  APTT  PROTIME-INR  TYPE AND SCREEN   ____________________________________________  EKG  Atrial fibrillation, left anterior fascicular block no acute ischemic changes ____________________________________________  RADIOLOGY Almeta Monas, personally viewed and evaluated these images (plain radiographs) as part of my medical decision making, as well as reviewing the written report by the radiologist.  ED MD interpretation:    I reviewed the x-ray of the ankle which shows no fracture  Reviewed the DVT study which is negative    ____________________________________________   PROCEDURES  Procedure(s) performed (including Critical Care):  .Critical Care Performed by: Rada Hay, MD Authorized by: Rada Hay, MD   Critical care provider statement:    Critical care time (minutes):  30   Critical care was time spent personally by me on the following activities:  Development of treatment plan  with patient or surrogate, discussions with consultants, evaluation of patient's response to treatment, examination of patient, ordering and review of laboratory studies, ordering and review of radiographic studies, ordering and performing treatments and interventions, pulse oximetry, re-evaluation of patient's condition and review of  old charts   ____________________________________________   INITIAL IMPRESSION / ASSESSMENT AND PLAN / ED COURSE     Patient is an 87 year old male who presents with acute onset of traumatic left leg pain last night.  He was seen in triage initially there was documented that he had 1+ DP and PT pulses that were also marked.  By the time of my evaluation patient no longer has palpable or dopplerable pulses in the left leg which is cold compared to the right leg that has a palpable DP pulse.  I am concerned for acute limb ischemia.  Immediately discussed with Dr. Franchot Gallo on vascular who recommends starting heparin and angiography for intervention.  Patient is on Eliquis and is apparently compliant, last dose was yesterday.  Will admit to the hospitalist service.  Will go to the OR with vascular.  Patient will be admitted to the hospitalist service.      ____________________________________________   FINAL CLINICAL IMPRESSION(S) / ED DIAGNOSES  Final diagnoses:  Pain  Limb ischemia     ED Discharge Orders     None        Note:  This document was prepared using Dragon voice recognition software and may include unintentional dictation errors.    Rada Hay, MD 12/25/20 1028

## 2020-12-25 NOTE — ED Notes (Signed)
Patient refuses to sign consent for angiography until his wife gets here.

## 2020-12-25 NOTE — Plan of Care (Signed)
  Problem: Education: Goal: Knowledge of General Education information will improve Description: Including pain rating scale, medication(s)/side effects and non-pharmacologic comfort measures Outcome: Progressing Note: Patient profile completed. Spouse is at the bedside. Skin intact, some bruising.

## 2020-12-25 NOTE — ED Notes (Signed)
Pt assisted to BR at this time 

## 2020-12-25 NOTE — Progress Notes (Signed)
Dr. Lucky Cowboy at bedside, speaking with pt. And his wife pre-procedure. Both verbalize understanding of conversation. Pt. Incontinent of urine: placed external condom catheter now.

## 2020-12-26 ENCOUNTER — Encounter: Payer: Self-pay | Admitting: Vascular Surgery

## 2020-12-26 DIAGNOSIS — I70222 Atherosclerosis of native arteries of extremities with rest pain, left leg: Secondary | ICD-10-CM

## 2020-12-26 LAB — BASIC METABOLIC PANEL
Anion gap: 7 (ref 5–15)
BUN: 24 mg/dL — ABNORMAL HIGH (ref 8–23)
CO2: 24 mmol/L (ref 22–32)
Calcium: 8.8 mg/dL — ABNORMAL LOW (ref 8.9–10.3)
Chloride: 107 mmol/L (ref 98–111)
Creatinine, Ser: 1.64 mg/dL — ABNORMAL HIGH (ref 0.61–1.24)
GFR, Estimated: 41 mL/min — ABNORMAL LOW (ref >60)
Glucose, Bld: 156 mg/dL — ABNORMAL HIGH (ref 70–99)
Potassium: 4.3 mmol/L (ref 3.5–5.1)
Sodium: 138 mmol/L (ref 135–145)

## 2020-12-26 LAB — CBC
HCT: 42.8 % (ref 39.0–52.0)
Hemoglobin: 13 g/dL (ref 13.0–17.0)
MCH: 28.4 pg (ref 26.0–34.0)
MCHC: 30.4 g/dL (ref 30.0–36.0)
MCV: 93.7 fL (ref 80.0–100.0)
Platelets: 184 10*3/uL (ref 150–400)
RBC: 4.57 MIL/uL (ref 4.22–5.81)
RDW: 14.6 % (ref 11.5–15.5)
WBC: 5.6 10*3/uL (ref 4.0–10.5)
nRBC: 0 % (ref 0.0–0.2)

## 2020-12-26 LAB — GLUCOSE, CAPILLARY
Glucose-Capillary: 146 mg/dL — ABNORMAL HIGH (ref 70–99)
Glucose-Capillary: 159 mg/dL — ABNORMAL HIGH (ref 70–99)
Glucose-Capillary: 142 mg/dL — ABNORMAL HIGH (ref 70–99)
Glucose-Capillary: 184 mg/dL — ABNORMAL HIGH (ref 70–99)

## 2020-12-26 MED ORDER — ASPIRIN EC 81 MG PO TBEC
81.0000 mg | DELAYED_RELEASE_TABLET | Freq: Every day | ORAL | Status: DC
  Administered 2020-12-26 – 2020-12-27 (×2): 81 mg via ORAL
  Filled 2020-12-26 (×2): qty 1

## 2020-12-26 NOTE — Progress Notes (Signed)
Progress Note    [name]   HYW:[id number]  DOB: Aug 02, 1933  DOA: 12/25/2020     1 Date of Service: 12/26/2020   Brief narrative: [name] is a 87 y.o. male with medical history significant of hypertension, hyperlipidemia, diabetes mellitus, stroke, GERD, prostate cancer, kidney stone, hard of hearing, dCHF, atrial fibrillation on Eliquis, AAA, CKD-3B, OSA on CPAP, SSS (s/p of pacemaker on 12/20/2020), anemia, who presented to ED on 12/25/2020 with acute onset left leg pain that was constant, severe, sharp and nonradiating.  He initially had documented 1+ DP and PT pulses, but later in the ED pulses were not palpable or detectable by Doppler.  Vascular surgery was consulted and patient was taken emergently for revascularization.      Assessment and Plan * Critical limb ischemia of left lower extremity ([place]) Patient had sudden onset of severe left lower extremity pain.  Distal pulses by palpation and Doppler was lost in the ED.  He was taken emergently for revascularization with Dr. Lucky Cowboy. --Management per vascular surgery --Transition from Aggrastat to Eliquis 5 mg twice daily -- PT evaluation  Chronic a-fib (HCC) Heart rate controlled. Resume Eliquis at 5 mg twice daily per vascular. Continue metoprolol. Telemetry.  Sick sinus syndrome ([place]) Status post pacemaker. Telemetry.  CKD (chronic kidney disease), stage IIIb Baseline creatinine 1.8-2.2 recently.  Presented with creatinine 2.06.  Renal function stable near baseline. --Monitor BMP  Chronic systolic heart failure ([place]) Echo in August 2021 showed recovered EF of 60 to 65%, moderate LVH with severe basal septal hypertrophy. Clinically appears compensated, only trace lower extremity edema and no JVD.  No shortness of breath orthopnea. BNP 167.6. -- Continue metoprolol -- Appears not on diuretics outpatient -- Monitor volume status  Essential hypertension BP stable. Continue home amlodipine and  metoprolol. IV hydralazine as needed.  Type II diabetes mellitus with renal manifestations (HCC) Home regimen appears to be Farxiga and insulin glargine 10 units daily. -- Continue glargine 7 units daily and sliding scale NovoLog  Chronic obstructive pulmonary disease (HCC) No wheezing or other signs of acute exacerbation at this time. Continue bronchodilators.  Hyperlipidemia, mixed Continue Lipitor  OSA (obstructive sleep apnea) CPAP ordered  Stroke (HCC) Continue Eliquis.  Prostate cancer [facility]) Status post prostatectomy and radiation.  Follows with Dr. Tasia Catchings.  Note from follow-up visit in September reviewed.  Now with recurrence and declines additional treatments. Follow-up with Dr. Tasia Catchings as scheduled.     Subjective:  Patient seen awake sitting up in bed today.  He reports full resolution of his left leg pain.  Denies any other acute complaints including chest pain, shortness of breath, abdominal pain nausea or vomiting, headaches.  Eager to attempt ambulation with therapy today and hopes to go home tomorrow.   Objective Vitals:   12/26/20 0731 12/26/20 1120 12/26/20 1124 12/26/20 1545  BP: 137/84 119/71 107/71 117/69  Pulse: 83 73 74 70  Resp: 20 17 20 17   Temp: 98.7 F (37.1 C) 98.7 F (37.1 C) (!) 97.3 F (36.3 C) (!) 97.3 F (36.3 C)  TempSrc:      SpO2: 96% 96% 95% 92%  Weight:      Height:       113.6 kg  Vital signs were reviewed and unremarkable.   Exam General exam: awake, alert, no acute distress HEENT: atraumatic, clear conjunctiva, anicteric sclera, moist mucus membranes, hearing grossly normal  Respiratory system: CTAB, no wheezes, rales or rhonchi, normal respiratory effort. Cardiovascular system: normal S1/S2, RRR,  no JVD, no lower extremity edema Central nervous system: A&O x3. no gross focal neurologic deficits, normal speech Extremities: 1+ DP pulse on the left, normal tone, moves all extremities Skin: dry, intact, normal temperature  including in the distal left lower extremity Psychiatry: normal mood, congruent affect, judgement and insight appear normal   Labs / Other Information My review of labs, imaging, notes and other tests is significant for Glucose 156, BUN 24, creatinine 1.64     Disposition Plan: Status is: Inpatient  Remains inpatient appropriate because: Warrants close monitoring given vascular procedure yesterday and transitioning from Aggrastat to Eliquis today.  PT evaluation pending.  Anticipate discharge possibly tomorrow pending clearance by vascular surgery.        Time spent: 30 minutes [facility] 12/26/2020, 3:59 PM

## 2020-12-26 NOTE — Hospital Course (Signed)
[name] is a 87 y.o. male with medical history significant of hypertension, hyperlipidemia, diabetes mellitus, stroke, GERD, prostate cancer, kidney stone, hard of hearing, dCHF, atrial fibrillation on Eliquis, AAA, CKD-3B, OSA on CPAP, SSS (s/p of pacemaker on 12/20/2020), anemia, who presented to ED on 12/25/2020 with acute onset left leg pain that was constant, severe, sharp and nonradiating.  He initially had documented 1+ DP and PT pulses, but later in the ED pulses were not palpable or detectable by Doppler.  Vascular surgery was consulted and patient was taken emergently for revascularization.

## 2020-12-26 NOTE — Assessment & Plan Note (Addendum)
Echo in August 2021 showed recovered EF of 60 to 65%, moderate LVH with severe basal septal hypertrophy. Clinically appears compensated, only trace lower extremity edema and no JVD.  No shortness of breath orthopnea. BNP 167.6. -- Continue metoprolol -- Appears not on diuretics outpatient -- Monitor volume status

## 2020-12-26 NOTE — Assessment & Plan Note (Signed)
CPAP ordered

## 2020-12-26 NOTE — Assessment & Plan Note (Signed)
Status post prostatectomy and radiation.  Follows with Dr. Tasia Catchings.  Note from follow-up visit in September reviewed.  Now with recurrence and declines additional treatments. Follow-up with Dr. Tasia Catchings as scheduled.

## 2020-12-26 NOTE — Assessment & Plan Note (Signed)
No wheezing or other signs of acute exacerbation at this time. Continue bronchodilators.

## 2020-12-26 NOTE — Assessment & Plan Note (Signed)
Baseline creatinine 1.8-2.2 recently.  Presented with creatinine 2.06.  Renal function stable near baseline. --Monitor BMP

## 2020-12-26 NOTE — Assessment & Plan Note (Signed)
Status post pacemaker. Telemetry.

## 2020-12-26 NOTE — TOC Initial Note (Signed)
Transition of Care [facility]) - Initial/Assessment Note    Patient Details  Name: [name] MRN: [id number] Date of Birth: 09-17-1933  Transition of Care [facility]) CM/SW Contact:    Alberteen Sam, LCSW Phone Number: 12/26/2020, 12:06 PM  Clinical Narrative:                  CSW completed readmission risk assessment with patient, he reports that he continues to see Dr. Humphrey Rolls as his primary care doctor and has no issues getting his medications or transportation issues getting to and from doctor's appointments.   Reports no noted needs at this time but stated MD was talking with him so he will call CSW back.    Expected Discharge Plan: Home/Self Care Barriers to Discharge: Continued Medical Work up   Patient Goals and CMS Choice Patient states their goals for this hospitalization and ongoing recovery are:: to go home CMS Medicare.gov Compare Post Acute Care list provided to:: Patient Choice offered to / list presented to : Patient  Expected Discharge Plan and Services Expected Discharge Plan: Home/Self Care       Living arrangements for the past 2 months: Single Family Home                                      Prior Living Arrangements/Services Living arrangements for the past 2 months: Single Family Home Lives with:: Self   Do you feel safe going back to the place where you live?: Yes               Activities of Daily Living Home Assistive Devices/Equipment: Gilford Rile (specify type) ADL Screening (condition at time of admission) Patient's cognitive ability adequate to safely complete daily activities?: Yes Is the patient deaf or have difficulty hearing?: No Does the patient have difficulty seeing, even when wearing glasses/contacts?: No Does the patient have difficulty concentrating, remembering, or making decisions?: No Patient able to express need for assistance with ADLs?: Yes Does the patient have difficulty dressing or bathing?: No Independently performs  ADLs?: Yes (appropriate for developmental age) Does the patient have difficulty walking or climbing stairs?: No Weakness of Legs: Left Weakness of Arms/Hands: None  Permission Sought/Granted                  Emotional Assessment Appearance:: Appears stated age Attitude/Demeanor/Rapport: Gracious Affect (typically observed): Calm Orientation: : Oriented to Self, Oriented to Place, Oriented to  Time, Oriented to Situation Alcohol / Substance Use: Not Applicable Psych Involvement: No (comment)  Admission diagnosis:  Pain [R52] Left leg pain [M79.605] Limb ischemia [I99.8] Ischemic leg [I99.8] Patient Active Problem List   Diagnosis Date Noted   Critical limb ischemia of left lower extremity ([place]) 12/25/2020   CKD (chronic kidney disease), stage IIIb 12/25/2020   Type II diabetes mellitus with renal manifestations ([place]) 12/25/2020   Stroke ([place]) 12/25/2020   Sick sinus syndrome ([place]) 12/19/2020   Retroperitoneal lymphadenopathy 04/26/2020   Other specified disorders of kidney and ureter 01/02/2020   Abdominal distension 10/31/2019   Atherosclerosis of aorta ([place]) 10/15/2019   Androgen deprivation therapy 10/13/2019   Other constipation 08/29/2019   Acute ischemic left MCA stroke ([place]) 08/27/2019   Normocytic anemia 08/05/2019   Prostate cancer ([place]) 08/05/2019   Goals of care, counseling/discussion 07/22/2019   Fecal impaction ([place]) 07/11/2019   Lower GI bleeding 06/30/2019   Diverticulosis of colon 02/07/2019   Chronic  obstructive pulmonary disease (HCC)    Chronic a-fib (HCC)    Type 2 diabetes mellitus with hyperglycemia ([place]) 10/23/2018   Acute or subacute form of ischemic heart disease ([place]) 10/23/2018   Need for vaccination against Streptococcus pneumoniae using pneumococcal conjugate vaccine 13 10/23/2018   Chronic mesenteric ischemia ([place]) 07/28/2018   Abdominal aortic aneurysm without rupture 07/28/2018   Primary osteoarthritis of left hip 02/12/2018    Bilateral hearing loss due to cerumen impaction 63/01/6008   Chronic systolic heart failure ([place]) 08/19/2017   Atrial fibrillation with RVR ([place]) 08/02/2017   Acute on chronic congestive heart failure ([place]) 08/02/2017   Prostatic hypertrophy 07/20/2017   Dysuria 07/20/2017   Hyperpigmentation 04/10/2017   Uncontrolled type 2 diabetes mellitus with hyperglycemia ([place]) 03/24/2017   Encounter for general adult medical examination with abnormal findings 03/24/2017   Chest pain 05/04/2016   Elevated troponin 05/04/2016   Essential hypertension 07/06/2014   Cough 03/21/2014   OSA (obstructive sleep apnea) 03/21/2014   Paroxysmal A-fib ([place]) 03/21/2014   Hx of adenomatous colonic polyps 06/28/2013   Liver cyst 06/28/2013   Chronic kidney disease 06/08/2013   Type 2 diabetes mellitus with stage 3b chronic kidney disease, without long-term current use of insulin ([place]) 06/08/2013   Hyperlipidemia, mixed 06/08/2013   Valvular heart disease 06/08/2013   PCP:  Lavera Guise, MD Pharmacy:   [facility] [place] [place] [address] Phone: [phone] Fax: [phone]     Social Determinants of Health (SDOH) Interventions    Readmission Risk Interventions Readmission Risk Prevention Plan 08/30/2019  Transportation Screening Complete  PCP or Specialist Appt within 3-5 Days Not Complete  Not Complete comments Plan for SNF  HRI or Home Care Consult Complete  Social Work Consult for [place] Planning/Counseling Complete  Palliative Care Screening Not Applicable  Medication Review (RN Care Manager) Referral to Pharmacy  Some recent data might be hidden

## 2020-12-26 NOTE — Assessment & Plan Note (Signed)
-  Continue Lipitor °

## 2020-12-26 NOTE — Progress Notes (Signed)
Patient states he can not wear hospital mask and did not want to use hospital unit. Patient states he wears nasal pillows. I informed patient that he could get someone to bring in his home unit. CPAP unit was removed from patient's room.

## 2020-12-26 NOTE — Assessment & Plan Note (Signed)
-   Continue Eliquis 

## 2020-12-26 NOTE — Assessment & Plan Note (Signed)
Heart rate controlled. Resume Eliquis at 5 mg twice daily per vascular. Continue metoprolol. Telemetry.

## 2020-12-26 NOTE — Assessment & Plan Note (Signed)
Home regimen appears to be Farxiga and insulin glargine 10 units daily. -- Continue glargine 7 units daily and sliding scale NovoLog

## 2020-12-26 NOTE — Progress Notes (Signed)
[facility] Daily Progress Note  12/25/20:             1.  Ultrasound guidance for vascular access right femoral artery             2.  Catheter placement into left SFA from right femoral approach             3.  Aortogram and selective left lower extremity angiogram             4.  Mechanical thrombectomy to the left popliteal artery, left anterior tibial artery, and left peroneal artery with the penumbra CAT 7 device             5.  Percutaneous transluminal angioplasty of the left peroneal artery with 2.5 mm diameter by 30 cm length angioplasty balloon             6.  StarClose closure device right femoral artery  Subjective: Patient without complaint this AM.  Notes a dramatic improvement to the discomfort he was experiencing to the left lower extremity.  No acute issues overnight.  Objective: Vitals:   12/26/20 0500 12/26/20 0731 12/26/20 1120 12/26/20 1124  BP:  137/84 119/71 107/71  Pulse:  83 73 74  Resp:  20 17 20   Temp:  98.7 F (37.1 C) 98.7 F (37.1 C) (!) 97.3 F (36.3 C)  TempSrc:      SpO2:  96% 96% 95%  Weight: 113.6 kg     Height:        Intake/Output Summary (Last 24 hours) at 12/26/2020 1202 Last data filed at 12/26/2020 0950 Gross per 24 hour  Intake 767.71 ml  Output 700 ml  Net 67.71 ml   Physical Exam: A&Ox3, NAD CV: RRR Pulmonary: CTA Bilaterally Abdomen: Soft, Nontender, Nondistended Right groin:  Access site: PAD intact.  Clean and dry.  No swelling or drainage noted. Vascular:  Left lower extremity: Thigh soft.  Calf soft.  Extremities warm distally to toes.  Hard to palpate pedal pulses however the foot is much warmer than it was yesterday.  Motor / sensory is intact.   Laboratory: CBC    Component Value Date/Time   WBC 5.6 12/26/2020 0607   HGB 13.0 12/26/2020 0607   HGB 14.6 06/11/2019 0953   HCT 42.8 12/26/2020 0607   HCT 46.0 06/11/2019 0953   PLT 184 12/26/2020 0607   PLT 154 06/11/2019 0953   BMET     Component Value Date/Time   NA 138 12/26/2020 0607   NA 144 06/11/2019 0953   NA 138 10/13/2013 0415   K 4.3 12/26/2020 0607   K 4.0 10/13/2013 0415   CL 107 12/26/2020 0607   CL 106 10/13/2013 0415   CO2 24 12/26/2020 0607   CO2 27 10/13/2013 0415   GLUCOSE 156 (H) 12/26/2020 0607   GLUCOSE 97 10/13/2013 0415   BUN 24 (H) 12/26/2020 0607   BUN 37 (H) 06/11/2019 0953   BUN 22 (H) 10/13/2013 0415   CREATININE 1.64 (H) 12/26/2020 0607   CREATININE 1.65 (H) 10/13/2013 0415   CALCIUM 8.8 (L) 12/26/2020 0607   CALCIUM 8.2 (L) 10/13/2013 0415   GFRNONAA 41 (L) 12/26/2020 0607   GFRNONAA 39 (L) 10/13/2013 0415   GFRAA 28 (L) 10/12/2019 1035   GFRAA 45 (L) 10/13/2013 0415   Assessment/Planning: The patient is an 87 year old male who presented with left lower extremity ischemia status post left lower extremity angiogram with intervention - POD#1  1)  successful revascularization of the left lower extremity 2) okay to remove right groin PAD 3) okay to work with physical therapy from a vascular standpoint 4) once the patient has been deemed appropriate for discharge home by physical therapy okay from a vascular standpoint for discharge 5) okay to transition from Aggrastat to Eliquis 5mg  twice daily 6) we will continue to follow the patient in clinic.  We will place follow-up in chart.  Discussed with Dr. Ellis Parents Phyllicia Dudek PA-C 12/26/2020 12:02 PM

## 2020-12-26 NOTE — Assessment & Plan Note (Signed)
Patient had sudden onset of severe left lower extremity pain.  Distal pulses by palpation and Doppler was lost in the ED.  He was taken emergently for revascularization with Dr. Lucky Cowboy. --Management per vascular surgery --Transition from Aggrastat to Eliquis 5 mg twice daily -- PT evaluation

## 2020-12-26 NOTE — Evaluation (Signed)
Physical Therapy Evaluation Patient Details Name: [name] MRN: [id number] DOB: August 20, 1933 Today's Date: 12/26/2020  History of Present Illness  87 year old gentleman with a past medical history of hypertension, heart failure, chronic kidney disease, diabetes type 2, hyperlipidemia, obstructive sleep apnea, BPH, abdominal aortic aneurysm, COPD recently admitted with sick sinus syndrome who  successfully underwent leadless pacemaker implantation on 12/19/2020 without apparent perioperative complications.  The patient endorses a history of waking up yesterday evening and on his way to use the bathroom experienced an acute sharp pain to the left lower extremity. S/P L SFA catheter with R femoral artery approach on 12/25/20.   Clinical Impression  Pt admitted with above diagnosis. Received in recliner agreeable to PT. Reports home lay out, DME, PLOF, assist available to pt at home without difficulty. 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 Resting HR at 86 BPM. Able to stand with min VC's for safe hand placement to RW and amb step through pattern 100 with supervision. Endorses L hip OA which is limiting his ability reporting pain and weakness in L hip he has been dealing with. RN provided chair follow for safety for return to room with pt in recliner post session all needs in reach. Encouraged to ambulate with nursing staff during acute stay. Pt amb safe home distances with RW with no LOB or instability appreciated throughout with safe use of RW. Will benefit from [facility] PT to return to independent ambulation with no AD and for improving LE strength/endurance. Pt currently with functional limitations due to the deficits listed below (see PT Problem List). Pt will benefit from skilled PT to increase their independence and safety with mobility to allow discharge to the venue listed below.     Recommendations for follow up therapy are one component of a  multi-disciplinary discharge planning process, led by the attending physician.  Recommendations may be updated based on patient status, additional functional criteria and insurance authorization.  Follow Up Recommendations Home health PT    Assistance Recommended at Discharge PRN  Functional Status Assessment Patient has had a recent decline in their functional status and demonstrates the ability to make significant improvements in function in a reasonable and predictable amount of time.  Equipment Recommendations  None recommended by PT    Recommendations for Other Services       Precautions / Restrictions Precautions Precautions: Fall Restrictions Weight Bearing Restrictions: No      Mobility  Bed Mobility               General bed mobility comments: Received and returned to recliner Patient Response: Cooperative  Transfers Overall transfer level: Needs assistance Equipment used: Rolling walker (2 wheels) Transfers: Sit to/from Stand Sit to Stand: Supervision                Ambulation/Gait Ambulation/Gait assistance: Supervision Gait Distance (Feet): 100 Feet Assistive device: Rolling walker (2 wheels) Gait Pattern/deviations: Step-through pattern          Stairs            Wheelchair Mobility    Modified Rankin (Stroke Patients Only)       Balance Overall balance assessment: Needs assistance   Sitting balance-Leahy Scale: Normal     Standing balance support: During functional activity Standing balance-Leahy Scale: Fair Standing balance comment: use of RW  Pertinent Vitals/Pain Pain Assessment: No/denies pain (Endorses soreness in R hip from procedure)    Home Living Family/patient expects to be discharged to:: Private residence Living Arrangements: Spouse/significant other Available Help at Discharge: Family;Available 24 hours/day Type of Home: House Home Access: Stairs to enter (uses  back door to enter/exit home) Entrance Stairs-Rails: None Entrance Stairs-Number of Steps: 1   Home Layout: One level Home Equipment: Conservation officer, nature (2 wheels);Grab bars - tub/shower Additional Comments: Desk job at [employer]    Prior Function Prior Level of Function : Independent/Modified Independent                     Hand Dominance   Dominant Hand: Right    Extremity/Trunk Assessment   Upper Extremity Assessment Upper Extremity Assessment: Generalized weakness;Overall [facility] for tasks assessed    Lower Extremity Assessment Lower Extremity Assessment: Generalized weakness       Communication   Communication: HOH  Cognition Arousal/Alertness: Awake/alert Behavior During Therapy: WFL for tasks assessed/performed Overall Cognitive Status: Within Functional Limits for tasks assessed                                          General Comments General comments (skin integrity, edema, etc.): Resting HR at 86 BPM    Exercises Other Exercises Other Exercises: Role of PT in acute setting, D/c recs, benefit of HH PT   Assessment/Plan    PT Assessment Patient needs continued PT services  PT Problem List Decreased strength;Decreased activity tolerance;Decreased mobility       PT Treatment Interventions DME instruction;Balance training;Gait training;Neuromuscular re-education;Stair training;Functional mobility training;Patient/family education;Therapeutic activities;Therapeutic exercise    PT Goals (Current goals can be found in the Care Plan section)  Acute Rehab PT Goals Patient Stated Goal: go home PT Goal Formulation: With patient Time For Goal Achievement: 01/09/21 Potential to Achieve Goals: Good    Frequency Min 2X/week   Barriers to discharge        Co-evaluation               AM-PAC PT "6 Clicks" Mobility  Outcome Measure Help needed turning from your back to your side while in a flat bed without using bedrails?: A  Little Help needed moving from lying on your back to sitting on the side of a flat bed without using bedrails?: A Little Help needed moving to and from a bed to a chair (including a wheelchair)?: A Little Help needed standing up from a chair using your arms (e.g., wheelchair or bedside chair)?: A Little Help needed to walk in hospital room?: A Little Help needed climbing 3-5 steps with a railing? : A Lot 6 Click Score: 17    End of Session Equipment Utilized During Treatment: Gait belt Activity Tolerance: Patient tolerated treatment well Patient left: in chair Nurse Communication: Mobility status PT Visit Diagnosis: Muscle weakness (generalized) (M62.81);Difficulty in walking, not elsewhere classified (R26.2)    Time: 4765-4650 PT Time Calculation (min) (ACUTE ONLY): 20 min   Charges:   PT Evaluation $PT Eval Low Complexity: 1 Low PT Treatments $Therapeutic Activity: 8-22 mins        Syenna Nazir M. Fairly IV, PT, DPT Physical Therapist- [facility]  12/26/2020, 2:20 PM

## 2020-12-26 NOTE — Assessment & Plan Note (Signed)
BP stable. Continue home amlodipine and metoprolol. IV hydralazine as needed.

## 2020-12-27 ENCOUNTER — Encounter: Payer: Self-pay | Admitting: Internal Medicine

## 2020-12-27 LAB — BASIC METABOLIC PANEL
Anion gap: 5 (ref 5–15)
BUN: 26 mg/dL — ABNORMAL HIGH (ref 8–23)
CO2: 24 mmol/L (ref 22–32)
Calcium: 8.7 mg/dL — ABNORMAL LOW (ref 8.9–10.3)
Chloride: 104 mmol/L (ref 98–111)
Creatinine, Ser: 1.77 mg/dL — ABNORMAL HIGH (ref 0.61–1.24)
GFR, Estimated: 37 mL/min — ABNORMAL LOW (ref >60)
Glucose, Bld: 255 mg/dL — ABNORMAL HIGH (ref 70–99)
Potassium: 4 mmol/L (ref 3.5–5.1)
Sodium: 133 mmol/L — ABNORMAL LOW (ref 135–145)

## 2020-12-27 LAB — CBC
HCT: 40.9 % (ref 39.0–52.0)
Hemoglobin: 12.7 g/dL — ABNORMAL LOW (ref 13.0–17.0)
MCH: 28.4 pg (ref 26.0–34.0)
MCHC: 31.1 g/dL (ref 30.0–36.0)
MCV: 91.5 fL (ref 80.0–100.0)
Platelets: 168 10*3/uL (ref 150–400)
RBC: 4.47 MIL/uL (ref 4.22–5.81)
RDW: 14.4 % (ref 11.5–15.5)
WBC: 6.2 10*3/uL (ref 4.0–10.5)
nRBC: 0 % (ref 0.0–0.2)

## 2020-12-27 LAB — GLUCOSE, CAPILLARY
Glucose-Capillary: 130 mg/dL — ABNORMAL HIGH (ref 70–99)
Glucose-Capillary: 184 mg/dL — ABNORMAL HIGH (ref 70–99)

## 2020-12-27 MED ORDER — ASPIRIN 81 MG PO TBEC
81.0000 mg | DELAYED_RELEASE_TABLET | Freq: Every day | ORAL | Status: DC
Start: 2020-12-28 — End: 2021-05-18

## 2020-12-27 MED ORDER — APIXABAN 5 MG PO TABS: 5.0000 mg | ORAL_TABLET | Freq: Two times a day (BID) | ORAL | 0 refills | Status: DC

## 2020-12-27 MED ORDER — TOUJEO MAX SOLOSTAR 300 UNIT/ML ~~LOC~~ SOPN: 10.0000 [IU] | PEN_INJECTOR | Freq: Every day | SUBCUTANEOUS | Status: DC

## 2020-12-27 NOTE — Discharge Summary (Addendum)
Physician Discharge Summary  [name]:[id number] DOB: [birth year] DOA: 12/25/2020  PCP: Lavera Guise, MD  Admit date: 12/25/2020 Discharge date: 12/27/2020  Discharge disposition: Home with home health care   Recommendations for Outpatient Follow-Up:   Follow-up with Dr. Lucky Cowboy, vascular surgeon, in 1 month Follow-up with PCP in 1 week   Discharge Diagnosis:   Principal Problem:   Critical limb ischemia of left lower extremity ([place]) Active Problems:   Essential hypertension   Hyperlipidemia, mixed   OSA (obstructive sleep apnea)   Chronic systolic heart failure (HCC)   Chronic obstructive pulmonary disease (HCC)   Chronic a-fib (HCC)   Prostate cancer ([place])   Sick sinus syndrome ([place])   CKD (chronic kidney disease), stage IIIb   Type II diabetes mellitus with renal manifestations ([place])   Stroke [facility])    Discharge Condition: Stable.  Diet recommendation:  Diet Order             Diet - low sodium heart healthy           Diet Heart Room service appropriate? Yes; Fluid consistency: Thin  Diet effective now                     Code Status: Full Code     Hospital Course:   [name] is a 87 y.o. male with medical history significant for obesity, hypertension, hyperlipidemia, diabetes mellitus, stroke, GERD, prostate cancer, kidney stone, hard of hearing, chronic diastolic CHF, atrial fibrillation on Eliquis, AAA, CKD-3B, OSA on CPAP, SSS (s/p of pacemaker on 12/20/2020), anemia.  He presented to the hospital with sudden onset of pain in the left leg.   He was admitted to the hospital for critical limb ischemia of the left lower extremity.  He was treated with IV heparin drip.  He underwent mechanical thrombectomy to the left popliteal artery, left anterior tibial artery and left peroneal artery.  Following surgery, he was treated with Aggrastat and was subsequently switched to Eliquis.  Condition has improved and is deemed stable for  discharge today.  Case was discussed with Dr. Lucky Cowboy, vascular surgeon (via secure chat).  He recommended that patient be discharged home on low-dose aspirin and Eliquis 5 mg twice daily.  Plan of care was discussed with the patient and his wife at the bedside.    Medical Consultants:   Vascular surgeon   Discharge Exam:    Vitals:   12/27/20 0400 12/27/20 0500 12/27/20 0731 12/27/20 1147  BP: 127/83  (!) 138/92 117/63  Pulse: 77  82 70  Resp: 17  18 16   Temp: 98 F (36.7 C)  97.6 F (36.4 C) 97.6 F (36.4 C)  TempSrc: Oral  Oral Oral  SpO2: 98%  98% 97%  Weight:  112.3 kg    Height:         GEN: NAD SKIN: Warm and dry EYES: No pallor or icterus ENT: MMM CV: RRR PULM: CTA B ABD: soft, ND, NT, +BS CNS: AAO x 3, non focal EXT: No edema or tenderness   The results of significant diagnostics from this hospitalization (including imaging, microbiology, ancillary and laboratory) are listed below for reference.     Procedures and Diagnostic Studies:   DG Tibia/Fibula Left  Result Date: 12/25/2020 CLINICAL DATA:  Left leg pain EXAM: LEFT TIBIA AND FIBULA - 2 VIEW COMPARISON:  None. FINDINGS: There is no evidence of fracture or other focal bone lesions. Soft tissues are unremarkable. IMPRESSION: Negative.  Electronically Signed   By: Fidela Salisbury M.D.   On: 12/25/2020 00:54   DG Ankle Complete Left  Result Date: 12/25/2020 CLINICAL DATA:  Left ankle pain EXAM: LEFT ANKLE COMPLETE - 3+ VIEW COMPARISON:  None. FINDINGS: Normal alignment. No acute fracture or dislocation. Ankle mortise is aligned. No ankle effusion. Soft tissues are unremarkable. Small plantar calcaneal spur. Prominent talar beak noted, a finding that can be associated with tarsal coalition though this is not well delineated on this examination. IMPRESSION: No acute fracture or dislocation. Electronically Signed   By: Fidela Salisbury M.D.   On: 12/25/2020 00:57   PERIPHERAL VASCULAR CATHETERIZATION  Result Date:  12/25/2020 See surgical note for result.  US Venous Img Lower Unilateral Left  Result Date: 12/25/2020 CLINICAL DATA:  Left lower extremity pain and swelling EXAM: LEFT LOWER EXTREMITY VENOUS DOPPLER ULTRASOUND TECHNIQUE: Gray-scale sonography with graded compression, as well as color Doppler and duplex ultrasound were performed to evaluate the lower extremity deep venous systems from the level of the common femoral vein and including the common femoral, femoral, profunda femoral, popliteal and calf veins including the posterior tibial, peroneal and gastrocnemius veins when visible. The superficial great saphenous vein was also interrogated. Spectral Doppler was utilized to evaluate flow at rest and with distal augmentation maneuvers in the common femoral, femoral and popliteal veins. COMPARISON:  None. FINDINGS: Contralateral Common Femoral Vein: Respiratory phasicity is normal and symmetric with the symptomatic side. No evidence of thrombus. Normal compressibility. Common Femoral Vein: No evidence of thrombus. Normal compressibility, respiratory phasicity and response to augmentation. Saphenofemoral Junction: No evidence of thrombus. Normal compressibility and flow on color Doppler imaging. Profunda Femoral Vein: No evidence of thrombus. Normal compressibility and flow on color Doppler imaging. Femoral Vein: No evidence of thrombus. Normal compressibility, respiratory phasicity and response to augmentation. Popliteal Vein: No evidence of thrombus. Normal compressibility, respiratory phasicity and response to augmentation. Calf Veins: No evidence of thrombus. Normal compressibility and flow on color Doppler imaging. Superficial Great Saphenous Vein: No evidence of thrombus. Normal compressibility. Venous Reflux:  None. Other Findings:  None. IMPRESSION: No evidence of deep venous thrombosis. Electronically Signed   By: Inez Catalina M.D.   On: 12/25/2020 01:01     Labs:   Basic Metabolic Panel: Recent  Labs  Lab 12/25/20 0009 12/26/20 0607 12/27/20 0250  NA 140 138 133*  K 4.0 4.3 4.0  CL 109 107 104  CO2 23 24 24   GLUCOSE 263* 156* 255*  BUN 29* 24* 26*  CREATININE 2.06* 1.64* 1.77*  CALCIUM 8.9 8.8* 8.7*   GFR Estimated Creatinine Clearance: 39.5 mL/min (A) (by C-G formula based on SCr of 1.77 mg/dL (H)). Liver Function Tests: Recent Labs  Lab 12/25/20 0009  AST 19  ALT 22  ALKPHOS 135*  BILITOT 0.5  PROT 7.1  ALBUMIN 3.4*   No results for input(s): LIPASE, AMYLASE in the last 168 hours. No results for input(s): AMMONIA in the last 168 hours. Coagulation profile Recent Labs  Lab 12/25/20 0834  INR 1.1    CBC: Recent Labs  Lab 12/25/20 0009 12/26/20 0607 12/27/20 0250  WBC 8.1 5.6 6.2  NEUTROABS 4.4  --   --   HGB 14.2 13.0 12.7*  HCT 46.1 42.8 40.9  MCV 93.3 93.7 91.5  PLT 183 184 168   Cardiac Enzymes: Recent Labs  Lab 12/25/20 0834  CKTOTAL 91   BNP: Invalid input(s): POCBNP CBG: Recent Labs  Lab 12/26/20 1146 12/26/20 1643 12/26/20 2036 12/27/20  0938 12/27/20 1144  GLUCAP 159* 142* 184* 130* 184*   D-Dimer No results for input(s): DDIMER in the last 72 hours. Hgb A1c No results for input(s): HGBA1C in the last 72 hours. Lipid Profile No results for input(s): CHOL, HDL, LDLCALC, TRIG, CHOLHDL, LDLDIRECT in the last 72 hours. Thyroid function studies No results for input(s): TSH, T4TOTAL, T3FREE, THYROIDAB in the last 72 hours.  Invalid input(s): FREET3 Anemia work up No results for input(s): VITAMINB12, FOLATE, FERRITIN, TIBC, IRON, RETICCTPCT in the last 72 hours. Microbiology Recent Results (from the past 240 hour(s))  Resp Panel by RT-PCR (Flu A&B, Covid) Nasopharyngeal Swab     Status: None   Collection Time: 12/25/20  8:34 AM   Specimen: Nasopharyngeal Swab; Nasopharyngeal(NP) swabs in vial transport medium  Result Value Ref Range Status   SARS Coronavirus 2 by RT PCR NEGATIVE NEGATIVE Final    Comment:  (NOTE) SARS-CoV-2 target nucleic acids are NOT DETECTED.  The SARS-CoV-2 RNA is generally detectable in upper respiratory specimens during the acute phase of infection. The lowest concentration of SARS-CoV-2 viral copies this assay can detect is 138 copies/mL. A negative result does not preclude SARS-Cov-2 infection and should not be used as the sole basis for treatment or other patient management decisions. A negative result may occur with  improper specimen collection/handling, submission of specimen other than nasopharyngeal swab, presence of viral mutation(s) within the areas targeted by this assay, and inadequate number of viral copies(<138 copies/mL). A negative result must be combined with clinical observations, patient history, and epidemiological information. The expected result is Negative.  Fact Sheet for Patients:  EntrepreneurPulse.com.au  Fact Sheet for Healthcare Providers:  IncredibleEmployment.be  This test is no t yet approved or cleared by the [place] FDA and  has been authorized for detection and/or diagnosis of SARS-CoV-2 by FDA under an Emergency Use Authorization (EUA). This EUA will remain  in effect (meaning this test can be used) for the duration of the COVID-19 declaration under Section 564(b)(1) of the Act, 21 U.S.C.section 360bbb-3(b)(1), unless the authorization is terminated  or revoked sooner.       Influenza A by PCR NEGATIVE NEGATIVE Final   Influenza B by PCR NEGATIVE NEGATIVE Final    Comment: (NOTE) The Xpert Xpress SARS-CoV-2/FLU/RSV plus assay is intended as an aid in the diagnosis of influenza from Nasopharyngeal swab specimens and should not be used as a sole basis for treatment. Nasal washings and aspirates are unacceptable for Xpert Xpress SARS-CoV-2/FLU/RSV testing.  Fact Sheet for Patients: EntrepreneurPulse.com.au  Fact Sheet for Healthcare  Providers: IncredibleEmployment.be  This test is not yet approved or cleared by the [place] FDA and has been authorized for detection and/or diagnosis of SARS-CoV-2 by FDA under an Emergency Use Authorization (EUA). This EUA will remain in effect (meaning this test can be used) for the duration of the COVID-19 declaration under Section 564(b)(1) of the Act, 21 U.S.C. section 360bbb-3(b)(1), unless the authorization is terminated or revoked.  Performed at [facility], [address] [place], [place] [address]      Discharge Instructions:   Discharge Instructions     Diet - low sodium heart healthy   Complete by: As directed    Increase activity slowly   Complete by: As directed       Allergies as of 12/27/2020   No Known Allergies      Medication List     STOP taking these medications    Lidocaine 4 % Ptch  trolamine salicylate 10 % cream Commonly known as: ASPERCREME       TAKE these medications    albuterol 108 (90 Base) MCG/ACT inhaler Commonly known as: VENTOLIN HFA Inhale 2 puffs into the lungs every 4 (four) hours as needed for wheezing or shortness of breath.   amLODipine 2.5 MG tablet Commonly known as: NORVASC TAKE 1 TABLET(2.5 MG) BY MOUTH DAILY   apixaban 5 MG Tabs tablet Commonly known as: ELIQUIS Take 1 tablet (5 mg total) by mouth 2 (two) times daily. What changed:  medication strength how much to take   aspirin 81 MG EC tablet Take 1 tablet (81 mg total) by mouth daily. Swallow whole. Start taking on: December 28, 2020   atorvastatin 10 MG tablet Commonly known as: LIPITOR Take 1 tablet (10 mg total) by mouth daily.   Capsaicin 0.025 % Ptch Place 1 patch onto the skin daily as needed (pain.).   Farxiga 10 MG Tabs tablet Generic drug: dapagliflozin propanediol TAKE 1 TABLET(10 MG) BY MOUTH DAILY BEFORE BREAKFAST   ferrous sulfate 325 (65 FE) MG tablet Take 325 mg by mouth daily.    metoprolol tartrate 25 MG tablet Commonly known as: LOPRESSOR Take 1 tablet (25 mg total) by mouth 2 (two) times daily.   OneTouch Verio test strip Generic drug: glucose blood USE ONCE DAILY. DX E11.65.   pantoprazole 40 MG tablet Commonly known as: PROTONIX Take 1 tablet (40 mg total) by mouth daily.   Pen Needles 3/16" 31G X 5 MM Misc To use daily with basal insulin injections   Spiriva Respimat 2.5 MCG/ACT Aers Generic drug: Tiotropium Bromide Monohydrate Inhale 2 puffs into the lungs daily.   Toujeo Max SoloStar 300 UNIT/ML Solostar Pen Generic drug: insulin glargine (2 Unit Dial) Inject 10 Units into the skin daily.   vitamin B-12 1000 MCG tablet Commonly known as: CYANOCOBALAMIN Take 1 tablet (1,000 mcg total) by mouth daily.        Follow-up Information     Dew, Erskine Squibb, MD Follow up in 1 month(s).   Specialties: Vascular Surgery, Radiology, Interventional Cardiology Why: Can see Dew or Arna Medici.  Ischemic leg.  Will need ABI with follow-up. Contact information: [place] [address] [phone]                   If you experience worsening of your admission symptoms, develop shortness of breath, life threatening emergency, suicidal or homicidal thoughts you must seek medical attention immediately by calling 911 or calling your MD immediately  if symptoms less severe.   You must read complete instructions/literature along with all the possible adverse reactions/side effects for all the medicines you take and that have been prescribed to you. Take any new medicines after you have completely understood and accept all the possible adverse reactions/side effects.    Please note   You were cared for by a hospitalist during your hospital stay. If you have any questions about your discharge medications or the care you received while you were in the hospital after you are discharged, you can call the unit and asked to speak with the hospitalist  on call if the hospitalist that took care of you is not available. Once you are discharged, your primary care physician will handle any further medical issues. Please note that NO REFILLS for any discharge medications will be authorized once you are discharged, as it is imperative that you return to your primary care physician (or establish a relationship with  a primary care physician if you do not have one) for your aftercare needs so that they can reassess your need for medications and monitor your lab values.       Time coordinating discharge: 33 minutes  Signed:  Stacye Noori  [facility] 12/27/2020, 12:09 PM   Pager on [email or web address]. If 7PM-7AM, please contact night-coverage at [email or web address]

## 2020-12-28 ENCOUNTER — Other Ambulatory Visit: Payer: Self-pay | Admitting: *Deleted

## 2020-12-28 NOTE — Patient Outreach (Signed)
[facility]) Care Management  [place]  12/30/2020   [name] Jan 09, 1934 [id number]   Admitted to hospital 11/29-11/30 for pacer placement and 12/5-12/7 for limb ischemia.  Outgoing call placed to member/wife, successful.  Denies any urgent concerns, encouraged to contact this care manager with questions.    Encounter Medications:  Outpatient Encounter Medications as of 12/28/2020  Medication Sig Note   albuterol (VENTOLIN HFA) 108 (90 Base) MCG/ACT inhaler Inhale 2 puffs into the lungs every 4 (four) hours as needed for wheezing or shortness of breath.    amLODipine (NORVASC) 2.5 MG tablet TAKE 1 TABLET(2.5 MG) BY MOUTH DAILY 12/25/2020: LF 11.19.22   apixaban (ELIQUIS) 5 MG TABS tablet Take 1 tablet (5 mg total) by mouth 2 (two) times daily.    aspirin EC 81 MG EC tablet Take 1 tablet (81 mg total) by mouth daily. Swallow whole.    atorvastatin (LIPITOR) 10 MG tablet Take 1 tablet (10 mg total) by mouth daily. 12/25/2020: LF 10.27.22   Capsaicin 0.025 % PTCH Place 1 patch onto the skin daily as needed (pain.).    FARXIGA 10 MG TABS tablet TAKE 1 TABLET(10 MG) BY MOUTH DAILY BEFORE BREAKFAST 12/25/2020: LF 11.19.22   ferrous sulfate 325 (65 FE) MG tablet Take 325 mg by mouth daily.  12/25/2020: LF 10.27.22   glucose blood (ONETOUCH VERIO) test strip USE ONCE DAILY. DX E11.65.    insulin glargine, 2 Unit Dial, (TOUJEO MAX SOLOSTAR) 300 UNIT/ML Solostar Pen Inject 10 Units into the skin daily.    Insulin Pen Needle (PEN NEEDLES 3/16") 31G X 5 MM MISC To use daily with basal insulin injections    metoprolol tartrate (LOPRESSOR) 25 MG tablet Take 1 tablet (25 mg total) by mouth 2 (two) times daily. 12/25/2020: LF 11.19.22   pantoprazole (PROTONIX) 40 MG tablet Take 1 tablet (40 mg total) by mouth daily. 12/25/2020: LF 11.14.22   Tiotropium Bromide Monohydrate (SPIRIVA RESPIMAT) 2.5 MCG/ACT AERS Inhale 2 puffs into the lungs daily. 12/25/2020: LF 12.2.22   vitamin  B-12 (CYANOCOBALAMIN) 1000 MCG tablet Take 1 tablet (1,000 mcg total) by mouth daily. (Patient not taking: Reported on 12/25/2020) 12/25/2020: Not on pt med list from pharmacy   No facility-administered encounter medications on file as of 12/28/2020.    Functional Status:  In your present state of health, do you have any difficulty performing the following activities: 12/25/2020 12/25/2020  Hearing? N N  Vision? N N  Difficulty concentrating or making decisions? N N  Walking or climbing stairs? N N  Dressing or bathing? N N  Doing errands, shopping? N -  Preparing Food and eating ? - -  Using the Toilet? - -  In the past six months, have you accidently leaked urine? - -  Do you have problems with loss of bowel control? - -  Managing your Medications? - -  Managing your Finances? - -  Housekeeping or managing your Housekeeping? - -  Some recent data might be hidden    Fall/Depression Screening: Fall Risk  12/08/2020 10/06/2020 03/31/2020  Falls in the past year? 0 0 0  Number falls in past yr: - - -  Injury with Fall? - - -  Comment - - -  Risk for fall due to : No Fall Risks No Fall Risks -  Follow up Falls evaluation completed Falls evaluation completed -   [facility] 2/9 Scores 12/08/2020 10/06/2020 03/31/2020 02/04/2020 12/03/2019 11/17/2019 10/29/2019  PHQ - 2 Score 0 0  0 0 0 0 0  Exception Documentation - - - - - - Other- indicate reason in comment box  Not completed - - - - - - According to wife and primary caregiver who performed the assessment.    Assessment:   Care Plan Care Plan : General Plan of Care (Adult)  Updates made by [name], RN since 12/30/2020 12:00 AM     Problem: Knowledge deficit regarding chronic disease management (DM, A-fib, CHF and prostate cancer) as evidenced by elevated A1C and PSH   Priority: High     Long-Range Goal: Member will show management of DM and prostate cancer evidenced by decreaesed A1C and PSH over the next 6 months   Start Date:  11/03/2020  Expected End Date: 05/02/2021  This Visit's Progress: On track  Recent Progress: On track  Priority: High  Note:   Current Barriers:  Knowledge Deficits related to plan of care for management of Atrial Fibrillation, CHF, DMII, and prostate cancer  Chronic Disease Management support and education needs related to Atrial Fibrillation, CHF, DMII, and prostate cancer  RNCM Clinical Goal(s):  Patient will verbalize understanding of plan for management of Atrial Fibrillation, CHF, DMII, and Gout as evidenced by member/wife reporting stability in health take all medications exactly as prescribed and will call provider for medication related questions as evidenced by reported adherence by wife/member    demonstrate understanding of rationale for each prescribed medication as evidenced by for cancer, CHF, A-fib and DM treatment.    attend all scheduled medical appointments: PCP, ONcology, and neurology as evidenced by PCP, ONcology, cardiology, and neurology        continue to work with [place] and/or Social Worker to address care management and care coordination needs related to Atrial Fibrillation, CHF, DMII, and Gout as evidenced by adherence to CM Team Scheduled appointments     through collaboration with RN Care manager, provider, and care team.   Interventions: Inter-disciplinary care team collaboration (see longitudinal plan of care) Evaluation of current treatment plan related to  self management and patient's adherence to plan as established by provider   Diabetes:  (Status: Goal on Track (progressing): YES.) Lab Results  Component Value Date   HGBA1C 8.4 (A) 08/18/2020  Assessed patient's understanding of A1c goal: <7% Provided education to patient about basic DM disease process; Reviewed medications with patient and discussed importance of medication adherence;        Reviewed prescribed diet with patient low carb/sugar; Counseled on importance of regular laboratory  monitoring as prescribed;        Advised patient, providing education and rationale, to check cbg daily and record         Oncology:  (Status: Goal on Track (progressing): YES.) Assessment of understanding of oncology diagnosis:  Assessed patient understanding of cancer diagnosis and recommended treatment plan, Assessed available transportation to appointments and treatments. Has consistent/reliable transportation: Yes, and Assessed support system. Has consistent/reliable family or other support: Yes  Heart Failure Interventions: Basic overview and discussion of pathophysiology of Heart Failure reviewed; Provided education on low sodium diet; Discussed importance of daily weight and advised patient to weigh and record daily; Reviewed role of diuretics in prevention of fluid overload and management of heart failure; Discussed the importance of keeping all appointments with provider;  AFIB Interventions:    Counseled on increased risk of stroke due to Afib and benefits of anticoagulation for stroke prevention; Reviewed importance of adherence to anticoagulant exactly as prescribed; Counseled on seeking  medical attention after a head injury or if there is blood in the urine/stool; Afib action plan reviewed;   Update 11/11 - Per wife, member has been doing well.  State he has continued to have intermittent chest discomfort as well as shortness of breath.  Was seen by cardiology yesterday, plan for pacemake placement on 11/29.  She is unclear about reason for device, educated on member's history and the goal of management of A-fib/CHF after intervention.  She verbalizes understanding.  She does not drive often, encouraged to call son and daughter in law to secure transportation to hospital.  Blood pressure today was 129/85, HR 73, blood sugar 140.  She is unable to find his weight for today, he is currently not available.  Noted that neurology appointment for 11/3 was canceled, will assist with  rescheduling after discharge home following pacer placement.   Patient Goals/Self-Care Activities: Patient will self administer medications as prescribed as evidenced by self report/primary caregiver report  Patient will attend all scheduled provider appointments as evidenced by clinician review of documented attendance to scheduled appointments and patient/caregiver report Patient will call pharmacy for medication refills as evidenced by patient report and review of pharmacy fill history as appropriate Patient will continue to perform ADL's independently as evidenced by patient/caregiver report   Update 12/8 - Noted that member was hospitalized twice in the last month, one for planned pacemaker implantation and for limb ischemia requiring vascular surgery.  Spoke with member and wife, they both report member is doing well since surgery.  Conference call placed vascular provider to request follow up appointment, they will call member/wife directly to schedule.  He already has appointment scheduled with PCP on 12/14 for labs and hospital follow up as well as tests scheduled on 12/16 for cancer scans.  Wife repeatedly state she is overwhelmed with managing member's care however is reluctant to ask son and daughter in law for help.  Encouraged to contact them and make them aware of upcoming appointments, advised to provide this care manager's contact information for discussion as well.         Plan:  Follow-up: Patient agrees to Care Plan and Follow-up. Follow-up in 2 week(s) to confirm vascular appointment was scheduled.  Will also help reschedule neurology appointment.  [name], [place], MSN [place] [phone]

## 2020-12-29 ENCOUNTER — Ambulatory Visit: Payer: Self-pay | Admitting: *Deleted

## 2021-01-03 ENCOUNTER — Ambulatory Visit (INDEPENDENT_AMBULATORY_CARE_PROVIDER_SITE_OTHER): Payer: Medicare Other | Admitting: Nurse Practitioner

## 2021-01-03 ENCOUNTER — Inpatient Hospital Stay: Payer: Medicare Other | Attending: Oncology

## 2021-01-03 ENCOUNTER — Encounter (INDEPENDENT_AMBULATORY_CARE_PROVIDER_SITE_OTHER): Payer: Self-pay | Admitting: Nurse Practitioner

## 2021-01-03 ENCOUNTER — Other Ambulatory Visit: Payer: Self-pay

## 2021-01-03 ENCOUNTER — Other Ambulatory Visit: Payer: Self-pay | Admitting: *Deleted

## 2021-01-03 ENCOUNTER — Encounter: Payer: Self-pay | Admitting: Nurse Practitioner

## 2021-01-03 VITALS — BP 100/69 | HR 95 | Temp 98.0°F | Resp 16 | Ht 72.0 in | Wt 250.0 lb

## 2021-01-03 DIAGNOSIS — Z794 Long term (current) use of insulin: Secondary | ICD-10-CM | POA: Diagnosis not present

## 2021-01-03 DIAGNOSIS — R590 Localized enlarged lymph nodes: Secondary | ICD-10-CM | POA: Diagnosis not present

## 2021-01-03 DIAGNOSIS — C61 Malignant neoplasm of prostate: Secondary | ICD-10-CM | POA: Diagnosis present

## 2021-01-03 DIAGNOSIS — I4891 Unspecified atrial fibrillation: Secondary | ICD-10-CM

## 2021-01-03 DIAGNOSIS — Z87891 Personal history of nicotine dependence: Secondary | ICD-10-CM | POA: Diagnosis not present

## 2021-01-03 DIAGNOSIS — M79605 Pain in left leg: Secondary | ICD-10-CM | POA: Insufficient documentation

## 2021-01-03 DIAGNOSIS — I70222 Atherosclerosis of native arteries of extremities with rest pain, left leg: Secondary | ICD-10-CM

## 2021-01-03 DIAGNOSIS — I700 Atherosclerosis of aorta: Secondary | ICD-10-CM | POA: Diagnosis not present

## 2021-01-03 DIAGNOSIS — C772 Secondary and unspecified malignant neoplasm of intra-abdominal lymph nodes: Secondary | ICD-10-CM | POA: Diagnosis not present

## 2021-01-03 DIAGNOSIS — E1122 Type 2 diabetes mellitus with diabetic chronic kidney disease: Secondary | ICD-10-CM

## 2021-01-03 DIAGNOSIS — Z5111 Encounter for antineoplastic chemotherapy: Secondary | ICD-10-CM | POA: Insufficient documentation

## 2021-01-03 DIAGNOSIS — Z833 Family history of diabetes mellitus: Secondary | ICD-10-CM | POA: Insufficient documentation

## 2021-01-03 DIAGNOSIS — Z79899 Other long term (current) drug therapy: Secondary | ICD-10-CM | POA: Diagnosis not present

## 2021-01-03 DIAGNOSIS — K429 Umbilical hernia without obstruction or gangrene: Secondary | ICD-10-CM | POA: Diagnosis not present

## 2021-01-03 DIAGNOSIS — Z800 Family history of malignant neoplasm of digestive organs: Secondary | ICD-10-CM | POA: Diagnosis not present

## 2021-01-03 DIAGNOSIS — I5022 Chronic systolic (congestive) heart failure: Secondary | ICD-10-CM | POA: Diagnosis not present

## 2021-01-03 DIAGNOSIS — Z9079 Acquired absence of other genital organ(s): Secondary | ICD-10-CM | POA: Insufficient documentation

## 2021-01-03 DIAGNOSIS — Z8249 Family history of ischemic heart disease and other diseases of the circulatory system: Secondary | ICD-10-CM | POA: Diagnosis not present

## 2021-01-03 DIAGNOSIS — Z8673 Personal history of transient ischemic attack (TIA), and cerebral infarction without residual deficits: Secondary | ICD-10-CM | POA: Insufficient documentation

## 2021-01-03 DIAGNOSIS — M2550 Pain in unspecified joint: Secondary | ICD-10-CM | POA: Diagnosis not present

## 2021-01-03 DIAGNOSIS — K7689 Other specified diseases of liver: Secondary | ICD-10-CM | POA: Insufficient documentation

## 2021-01-03 DIAGNOSIS — Z8042 Family history of malignant neoplasm of prostate: Secondary | ICD-10-CM | POA: Insufficient documentation

## 2021-01-03 DIAGNOSIS — M170 Bilateral primary osteoarthritis of knee: Secondary | ICD-10-CM | POA: Insufficient documentation

## 2021-01-03 DIAGNOSIS — E119 Type 2 diabetes mellitus without complications: Secondary | ICD-10-CM | POA: Diagnosis not present

## 2021-01-03 DIAGNOSIS — N1832 Chronic kidney disease, stage 3b: Secondary | ICD-10-CM | POA: Diagnosis not present

## 2021-01-03 DIAGNOSIS — Z7901 Long term (current) use of anticoagulants: Secondary | ICD-10-CM | POA: Insufficient documentation

## 2021-01-03 DIAGNOSIS — Z87442 Personal history of urinary calculi: Secondary | ICD-10-CM | POA: Insufficient documentation

## 2021-01-03 LAB — COMPREHENSIVE METABOLIC PANEL
ALT: 20 U/L (ref 0–44)
AST: 20 U/L (ref 15–41)
Albumin: 3.8 g/dL (ref 3.5–5.0)
Alkaline Phosphatase: 124 U/L (ref 38–126)
Anion gap: 10 (ref 5–15)
BUN: 40 mg/dL — ABNORMAL HIGH (ref 8–23)
CO2: 24 mmol/L (ref 22–32)
Calcium: 9.4 mg/dL (ref 8.9–10.3)
Chloride: 100 mmol/L (ref 98–111)
Creatinine, Ser: 2.59 mg/dL — ABNORMAL HIGH (ref 0.61–1.24)
GFR, Estimated: 24 mL/min — ABNORMAL LOW (ref >60)
Glucose, Bld: 161 mg/dL — ABNORMAL HIGH (ref 70–99)
Potassium: 4.7 mmol/L (ref 3.5–5.1)
Sodium: 134 mmol/L — ABNORMAL LOW (ref 135–145)
Total Bilirubin: 0.5 mg/dL (ref 0.3–1.2)
Total Protein: 7.9 g/dL (ref 6.5–8.1)

## 2021-01-03 LAB — CBC WITH DIFFERENTIAL/PLATELET
Abs Immature Granulocytes: 0.03 10*3/uL (ref 0.00–0.07)
Basophils Absolute: 0 10*3/uL (ref 0.0–0.1)
Basophils Relative: 0 %
Eosinophils Absolute: 0.1 10*3/uL (ref 0.0–0.5)
Eosinophils Relative: 2 %
HCT: 45.3 % (ref 39.0–52.0)
Hemoglobin: 14.5 g/dL (ref 13.0–17.0)
Immature Granulocytes: 0 %
Lymphocytes Relative: 28 %
Lymphs Abs: 1.9 10*3/uL (ref 0.7–4.0)
MCH: 29.1 pg (ref 26.0–34.0)
MCHC: 32 g/dL (ref 30.0–36.0)
MCV: 91 fL (ref 80.0–100.0)
Monocytes Absolute: 0.7 10*3/uL (ref 0.1–1.0)
Monocytes Relative: 10 %
Neutro Abs: 4.1 10*3/uL (ref 1.7–7.7)
Neutrophils Relative %: 60 %
Platelets: 245 10*3/uL (ref 150–400)
RBC: 4.98 MIL/uL (ref 4.22–5.81)
RDW: 14.3 % (ref 11.5–15.5)
WBC: 6.8 10*3/uL (ref 4.0–10.5)
nRBC: 0 % (ref 0.0–0.2)

## 2021-01-03 LAB — PSA: Prostatic Specific Antigen: 60.16 ng/mL — ABNORMAL HIGH (ref 0.00–4.00)

## 2021-01-03 NOTE — Patient Outreach (Signed)
[facility]) Care Management  [name]  01/03/2021   [name] August 21, 1933 [id number]   RED ON EMMI ALERT - General Discharge Day # 4 Date: 12/12 Red Alert Reason: Lost interest in things they used to enjoy?  YES   Outreach attempt #1, successful to wife.  State member is not home at this time, he is unable to speak on above noted red alert.  Wife report member is used to going to work daily and he has not been able to do so since his surgery.  She will discuss with him upon his return, if there are any other concenrs she will call.  Otherwise, state he has been in his usual health.  Denies any urgent concerns, encouraged to contact this care manager with questions.    Encounter Medications:  Outpatient Encounter Medications as of 01/03/2021  Medication Sig Note   albuterol (VENTOLIN HFA) 108 (90 Base) MCG/ACT inhaler Inhale 2 puffs into the lungs every 4 (four) hours as needed for wheezing or shortness of breath.    amLODipine (NORVASC) 2.5 MG tablet TAKE 1 TABLET(2.5 MG) BY MOUTH DAILY 12/25/2020: LF 11.19.22   apixaban (ELIQUIS) 5 MG TABS tablet Take 1 tablet (5 mg total) by mouth 2 (two) times daily.    aspirin EC 81 MG EC tablet Take 1 tablet (81 mg total) by mouth daily. Swallow whole.    atorvastatin (LIPITOR) 10 MG tablet Take 1 tablet (10 mg total) by mouth daily. 12/25/2020: LF 10.27.22   Capsaicin 0.025 % PTCH Place 1 patch onto the skin daily as needed (pain.).    FARXIGA 10 MG TABS tablet TAKE 1 TABLET(10 MG) BY MOUTH DAILY BEFORE BREAKFAST 12/25/2020: LF 11.19.22   ferrous sulfate 325 (65 FE) MG tablet Take 325 mg by mouth daily.  12/25/2020: LF 10.27.22   glucose blood (ONETOUCH VERIO) test strip USE ONCE DAILY. DX E11.65.    insulin glargine, 2 Unit Dial, (TOUJEO MAX SOLOSTAR) 300 UNIT/ML Solostar Pen Inject 10 Units into the skin daily.    Insulin Pen Needle (PEN NEEDLES 3/16") 31G X 5 MM MISC To use daily with basal insulin injections     metoprolol tartrate (LOPRESSOR) 25 MG tablet Take 1 tablet (25 mg total) by mouth 2 (two) times daily. 12/25/2020: LF 11.19.22   pantoprazole (PROTONIX) 40 MG tablet Take 1 tablet (40 mg total) by mouth daily. 12/25/2020: LF 11.14.22   Tiotropium Bromide Monohydrate (SPIRIVA RESPIMAT) 2.5 MCG/ACT AERS Inhale 2 puffs into the lungs daily. 12/25/2020: LF 12.2.22   vitamin B-12 (CYANOCOBALAMIN) 1000 MCG tablet Take 1 tablet (1,000 mcg total) by mouth daily. 12/25/2020: Not on pt med list from pharmacy   No facility-administered encounter medications on file as of 01/03/2021.    Functional Status:  In your present state of health, do you have any difficulty performing the following activities: 12/25/2020 12/25/2020  Hearing? N N  Vision? N N  Difficulty concentrating or making decisions? N N  Walking or climbing stairs? N N  Dressing or bathing? N N  Doing errands, shopping? N -  Preparing Food and eating ? - -  Using the Toilet? - -  In the past six months, have you accidently leaked urine? - -  Do you have problems with loss of bowel control? - -  Managing your Medications? - -  Managing your Finances? - -  Housekeeping or managing your Housekeeping? - -  Some recent data might be hidden    Fall/Depression Screening: Fall  Risk  12/08/2020 10/06/2020 03/31/2020  Falls in the past year? 0 0 0  Number falls in past yr: - - -  Injury with Fall? - - -  Comment - - -  Risk for fall due to : No Fall Risks No Fall Risks -  Follow up Falls evaluation completed Falls evaluation completed -   PHQ 2/9 Scores 12/08/2020 10/06/2020 03/31/2020 02/04/2020 12/03/2019 11/17/2019 10/29/2019  PHQ - 2 Score 0 0 0 0 0 0 0  Exception Documentation - - - - - - Other- indicate reason in comment box  Not completed - - - - - - According to wife and primary caregiver who performed the assessment.    Assessment:   Care Plan Care Plan : General Plan of Care (Adult)  Updates made by [name], RN since  01/03/2021 12:00 AM     Problem: Knowledge deficit regarding chronic disease management (DM, A-fib, CHF and prostate cancer) as evidenced by elevated A1C and PSH   Priority: High     Long-Range Goal: Member will show management of DM and prostate cancer evidenced by decreaesed A1C and PSH over the next 6 months   Start Date: 11/03/2020  Expected End Date: 05/02/2021  This Visit's Progress: On track  Recent Progress: On track  Priority: High  Note:   Current Barriers:  Knowledge Deficits related to plan of care for management of Atrial Fibrillation, CHF, DMII, and prostate cancer  Chronic Disease Management support and education needs related to Atrial Fibrillation, CHF, DMII, and prostate cancer  RNCM Clinical Goal(s):  Patient will verbalize understanding of plan for management of Atrial Fibrillation, CHF, DMII, and Gout as evidenced by member/wife reporting stability in health take all medications exactly as prescribed and will call provider for medication related questions as evidenced by reported adherence by wife/member    demonstrate understanding of rationale for each prescribed medication as evidenced by for cancer, CHF, A-fib and DM treatment.    attend all scheduled medical appointments: PCP, ONcology, and neurology as evidenced by PCP, ONcology, cardiology, and neurology        continue to work with [place] and/or Social Worker to address care management and care coordination needs related to Atrial Fibrillation, CHF, DMII, and Gout as evidenced by adherence to CM Team Scheduled appointments     through collaboration with RN Care manager, provider, and care team.   Interventions: Inter-disciplinary care team collaboration (see longitudinal plan of care) Evaluation of current treatment plan related to  self management and patient's adherence to plan as established by provider   Diabetes:  (Status: Goal on Track (progressing): YES.) Lab Results  Component Value Date    HGBA1C 8.4 (A) 08/18/2020  Assessed patient's understanding of A1c goal: <7% Provided education to patient about basic DM disease process; Reviewed medications with patient and discussed importance of medication adherence;        Reviewed prescribed diet with patient low carb/sugar; Counseled on importance of regular laboratory monitoring as prescribed;        Advised patient, providing education and rationale, to check cbg daily and record         Oncology:  (Status: Goal on Track (progressing): YES.) Assessment of understanding of oncology diagnosis:  Assessed patient understanding of cancer diagnosis and recommended treatment plan, Assessed available transportation to appointments and treatments. Has consistent/reliable transportation: Yes, and Assessed support system. Has consistent/reliable family or other support: Yes  Heart Failure Interventions: Basic overview and discussion of pathophysiology of Heart  Failure reviewed; Provided education on low sodium diet; Discussed importance of daily weight and advised patient to weigh and record daily; Reviewed role of diuretics in prevention of fluid overload and management of heart failure; Discussed the importance of keeping all appointments with provider;  AFIB Interventions:    Counseled on increased risk of stroke due to Afib and benefits of anticoagulation for stroke prevention; Reviewed importance of adherence to anticoagulant exactly as prescribed; Counseled on seeking medical attention after a head injury or if there is blood in the urine/stool; Afib action plan reviewed;   Update 11/11 - Per wife, member has been doing well.  State he has continued to have intermittent chest discomfort as well as shortness of breath.  Was seen by cardiology yesterday, plan for pacemake placement on 11/29.  She is unclear about reason for device, educated on member's history and the goal of management of A-fib/CHF after intervention.  She verbalizes  understanding.  She does not drive often, encouraged to call son and daughter in law to secure transportation to hospital.  Blood pressure today was 129/85, HR 73, blood sugar 140.  She is unable to find his weight for today, he is currently not available.  Noted that neurology appointment for 11/3 was canceled, will assist with rescheduling after discharge home following pacer placement.   Patient Goals/Self-Care Activities: Patient will self administer medications as prescribed as evidenced by self report/primary caregiver report  Patient will attend all scheduled provider appointments as evidenced by clinician review of documented attendance to scheduled appointments and patient/caregiver report Patient will call pharmacy for medication refills as evidenced by patient report and review of pharmacy fill history as appropriate Patient will continue to perform ADL's independently as evidenced by patient/caregiver report   Update 12/8 - Noted that member was hospitalized twice in the last month, one for planned pacemaker implantation and for limb ischemia requiring vascular surgery.  Spoke with member and wife, they both report member is doing well since surgery.  Conference call placed vascular provider to request follow up appointment, they will call member/wife directly to schedule.  He already has appointment scheduled with PCP on 12/14 for labs and hospital follow up as well as tests scheduled on 12/16 for cancer scans.  Wife repeatedly state she is overwhelmed with managing member's care however is reluctant to ask son and daughter in law for help.  Encouraged to contact them and make them aware of upcoming appointments, advised to provide this care manager's contact information for discussion as well.   Update 12/14 - Wife report member is out driving today, went to MD office for labs, will return home to pick her up prior to PCP appointment.  She state he has been doing well, has not had any  further pain.  Conference calls placed to Vascular and neurology office, appointments made for 1/6 at 1pm (vascular) and 1/19 at 145pm (neurology).  Reminded of CT scans scheduled for Friday, 12/16.           Plan:  Follow-up: Patient agrees to Care Plan and Follow-up. Follow-up in 1 month(s).  [name], RN, MSN, [place] Manager [phone]

## 2021-01-03 NOTE — Progress Notes (Signed)
[facility] [name], [place] [name] [id number] [place] [facility] Discharge Acute Issues Care Follow Up                                                                        Patient Demographics  [name], is a 87 y.o. male  DOB 01/06/1934  MRN [id number].  Primary Provider Jonetta Osgood MSN, FNP-C   Reason for TCC follow Up - Critical limb ischemia of left lower extremity    Past Medical History:  Diagnosis Date   Atrial fibrillation [place] [facility])    CHF (congestive heart failure) ([place])    Diabetes ([place])    Hearing loss    High blood pressure    History of bladder problems    Kidney stones 11/22/2014   Prostate cancer ([place])    Prostate cancer ([place])    Stroke [place] [facility])     Past Surgical History:  Procedure Laterality Date   CATARACT EXTRACTION     COLONOSCOPY WITH PROPOFOL N/A 07/02/2019   Procedure: COLONOSCOPY WITH PROPOFOL;  Surgeon: Lin Landsman, MD;  Location: [facility] ENDOSCOPY;  Service: Gastroenterology;  Laterality: N/A;   IR CT HEAD LTD  08/27/2019   IR PERCUTANEOUS ART THROMBECTOMY/INFUSION INTRACRANIAL INC DIAG ANGIO  08/27/2019       IR PERCUTANEOUS ART THROMBECTOMY/INFUSION INTRACRANIAL INC DIAG ANGIO  08/27/2019   LOWER EXTREMITY ANGIOGRAPHY Left 12/25/2020   Procedure: Lower Extremity Angiography;  Surgeon: Algernon Huxley, MD;  Location: [place];  Service: Cardiovascular;  Laterality: Left;   PACEMAKER LEADLESS INSERTION N/A 12/19/2020   Procedure: PACEMAKER LEADLESS INSERTION;  Surgeon: Isaias Cowman, MD;  Location: [facility];  Service: Cardiovascular;  Laterality: N/A;   PROSTATE CANCER     PROSTATE SURGERY     RADIOLOGY WITH ANESTHESIA N/A 08/27/2019   Procedure: IR WITH ANESTHESIA;  Surgeon: Luanne Bras, MD;  Location: [place];  Service: Radiology;  Laterality: N/A;       Recent HPI and Werner  Course  [name] is a 87 y.o. male with medical history significant for obesity, hypertension, hyperlipidemia, diabetes mellitus, stroke, GERD, prostate cancer, kidney stone, hard of hearing, chronic diastolic CHF, atrial fibrillation on Eliquis, AAA, CKD-3B, OSA on CPAP, SSS (s/p of pacemaker on 12/20/2020), anemia.  He presented to the Werner with sudden onset of pain in the left leg.    He was admitted to the [facility] for critical limb ischemia of the left lower extremity.  He was treated with IV heparin drip.  He underwent mechanical thrombectomy to the left popliteal artery, left anterior tibial artery and left peroneal artery.  Following surgery, he was treated with Aggrastat and was subsequently switched to Eliquis.  Condition has improved and is deemed stable for discharge today.  Case was discussed with Dr. Lucky Cowboy, vascular surgeon (via secure chat).  He recommended that patient be discharged home on low-dose aspirin and Eliquis 5 mg twice daily.  Plan of care was discussed with  the patient and his wife at the bedside.  [name] Acute Care Issue to be followed in the Clinic   Principal Problem:   Critical limb ischemia of left lower extremity ([place]) Active Problems:   Essential hypertension   Hyperlipidemia, mixed   OSA (obstructive sleep apnea)   Chronic systolic heart failure (HCC)   Chronic obstructive pulmonary disease (HCC)   Chronic a-fib (HCC)   Prostate cancer (HCC)   Sick sinus syndrome (HCC)   CKD (chronic kidney disease), stage IIIb   Type II diabetes mellitus with renal manifestations ([place])   history of Stroke (HCC)   Subjective:   [name] today has, No headache, No chest pain, No abdominal pain - No Nausea, No new weakness tingling or numbness, No Cough no SOB, denies any left leg pain  Assessment & Plan    1. Critical limb ischemia of left lower extremity ([place]) Patient was recently discharged from the [facility] after have ischemia of the  left lower extremity due to a blood clot. He was treated with intravenous anticoagulation and thrombectomy in the [facility]. He was transitioned to eliquis prior to discharge from the [facility]. He denies any pain in the left leg today. He will continue eliquis as prescribed and follow up with designated specialist.   2. Chronic systolic heart failure ([place]) He has chronic systolic heart failure which contributed to poor peripheral vascular circulation which increases the risk of developing blood clots in the lower extremities. He is followed by cardiology for this problem.   3. Stage 3b chronic kidney disease ([place]) This problem is being monitored and is related to his diabetes. He has not yet been referred to nephrology. If his kidney function decreases further, will discuss referral with patient and family.  4. Type 2 diabetes mellitus with stage 3b chronic kidney disease, with long-term current use of insulin (HCC) His basal insulin dose was decreased from 14 units daily to 10 units daily while hospitalized. His glucose levels have been around 200 every morning. Patient instructed to increase basal insulin dose back to 14 units daily. Patient encouraged to continue other medications as prescribed and continue to monitor her glucose levels daily.   5. Atrial fibrillation with RVR ([place]) He had his procedure done prior to this hospitalization for pacemaker implantation at the beginning of the month. He reports that this went well and he has a follow up visit in January about his pacemaker with his cardiologist.     Reason for frequent admissions/ER visits :  CKD Chronic systolic CHF DVT Type 2 diabetes      Objective:   Vitals:   01/03/21 1446  BP: 100/69  Pulse: 95  Resp: 16  Temp: 98 F (36.7 C)  SpO2: 97%  Weight: 250 lb (113.4 kg)  Height: 6' (1.829 m)    Wt Readings from Last 3 Encounters:  01/03/21 250 lb (113.4 kg)  12/27/20 247 lb 9.6 oz (112.3 kg)  12/19/20 255 lb  (115.7 kg)    Allergies as of 01/03/2021   No Known Allergies      Medication List        Accurate as of January 03, 2021 11:59 PM. If you have any questions, ask your nurse or doctor.          albuterol 108 (90 Base) MCG/ACT inhaler Commonly known as: VENTOLIN HFA Inhale 2 puffs into the lungs every 4 (four) hours as needed for wheezing or shortness of breath.   amLODipine 2.5 MG tablet Commonly  known as: NORVASC TAKE 1 TABLET(2.5 MG) BY MOUTH DAILY   apixaban 5 MG Tabs tablet Commonly known as: ELIQUIS Take 1 tablet (5 mg total) by mouth 2 (two) times daily.   aspirin 81 MG EC tablet Take 1 tablet (81 mg total) by mouth daily. Swallow whole.   atorvastatin 10 MG tablet Commonly known as: LIPITOR Take 1 tablet (10 mg total) by mouth daily.   Capsaicin 0.025 % Ptch Place 1 patch onto the skin daily as needed (pain.).   Farxiga 10 MG Tabs tablet Generic drug: dapagliflozin propanediol TAKE 1 TABLET(10 MG) BY MOUTH DAILY BEFORE BREAKFAST   ferrous sulfate 325 (65 FE) MG tablet Take 325 mg by mouth daily.   metoprolol tartrate 25 MG tablet Commonly known as: LOPRESSOR Take 1 tablet (25 mg total) by mouth 2 (two) times daily.   OneTouch Verio test strip Generic drug: glucose blood USE ONCE DAILY. DX E11.65.   pantoprazole 40 MG tablet Commonly known as: PROTONIX Take 1 tablet (40 mg total) by mouth daily.   Pen Needles 3/16" 31G X 5 MM Misc To use daily with basal insulin injections   Spiriva Respimat 2.5 MCG/ACT Aers Generic drug: Tiotropium Bromide Monohydrate Inhale 2 puffs into the lungs daily.   Toujeo Max SoloStar 300 UNIT/ML Solostar Pen Generic drug: insulin glargine (2 Unit Dial) Inject 10 Units into the skin daily.   vitamin B-12 1000 MCG tablet Commonly known as: CYANOCOBALAMIN Take 1 tablet (1,000 mcg total) by mouth daily.         Physical Exam: Constitutional: Patient appears well-developed and well-nourished. Not in obvious  distress. HENT: Normocephalic, atraumatic, External right and left ear normal. Oropharynx is clear and moist.  Eyes: Conjunctivae and EOM are normal. PERRLA, no scleral icterus. Neck: Normal ROM. Neck supple. No JVD. No tracheal deviation. No thyromegaly. CVS: RRR, S1/S2 +, no murmurs, no gallops, no carotid bruit.  Pulmonary: Effort and breath sounds normal, no stridor, rhonchi, wheezes, rales.  Abdominal: Soft. BS +, no distension, tenderness, rebound or guarding.  Musculoskeletal: Normal range of motion. No edema and no tenderness.  Lymphadenopathy: No lymphadenopathy noted, cervical, inguinal or axillary Neuro: Alert. Normal reflexes, muscle tone coordination. No cranial nerve deficit. Skin: Skin is warm and dry. No rash noted. Not diaphoretic. No erythema. No pallor. Psychiatric: Normal mood and affect. Behavior, judgment, thought content normal.   Data Review   Micro Results Recent Results (from the past 240 hour(s))  Resp Panel by RT-PCR (Flu A&B, Covid) Nasopharyngeal Swab     Status: None   Collection Time: 12/25/20  8:34 AM   Specimen: Nasopharyngeal Swab; Nasopharyngeal(NP) swabs in vial transport medium  Result Value Ref Range Status   SARS Coronavirus 2 by RT PCR NEGATIVE NEGATIVE Final    Comment: (NOTE) SARS-CoV-2 target nucleic acids are NOT DETECTED.  The SARS-CoV-2 RNA is generally detectable in upper respiratory specimens during the acute phase of infection. The lowest concentration of SARS-CoV-2 viral copies this assay can detect is 138 copies/mL. A negative result does not preclude SARS-Cov-2 infection and should not be used as the sole basis for treatment or other patient management decisions. A negative result may occur with  improper specimen collection/handling, submission of specimen other than nasopharyngeal swab, presence of viral mutation(s) within the areas targeted by this assay, and inadequate number of viral copies(<138 copies/mL). A negative result  must be combined with clinical observations, patient history, and epidemiological information. The expected result is Negative.  Fact Sheet for Patients:  EntrepreneurPulse.com.au  Fact Sheet for Healthcare Providers:  IncredibleEmployment.be  This test is no t yet approved or cleared by the [place] FDA and  has been authorized for detection and/or diagnosis of SARS-CoV-2 by FDA under an Emergency Use Authorization (EUA). This EUA will remain  in effect (meaning this test can be used) for the duration of the COVID-19 declaration under Section 564(b)(1) of the Act, 21 U.S.C.section 360bbb-3(b)(1), unless the authorization is terminated  or revoked sooner.       Influenza A by PCR NEGATIVE NEGATIVE Final   Influenza B by PCR NEGATIVE NEGATIVE Final    Comment: (NOTE) The Xpert Xpress SARS-CoV-2/FLU/RSV plus assay is intended as an aid in the diagnosis of influenza from Nasopharyngeal swab specimens and should not be used as a sole basis for treatment. Nasal washings and aspirates are unacceptable for Xpert Xpress SARS-CoV-2/FLU/RSV testing.  Fact Sheet for Patients: EntrepreneurPulse.com.au  Fact Sheet for Healthcare Providers: IncredibleEmployment.be  This test is not yet approved or cleared by the [place] FDA and has been authorized for detection and/or diagnosis of SARS-CoV-2 by FDA under an Emergency Use Authorization (EUA). This EUA will remain in effect (meaning this test can be used) for the duration of the COVID-19 declaration under Section 564(b)(1) of the Act, 21 U.S.C. section 360bbb-3(b)(1), unless the authorization is terminated or revoked.  Performed at [facility], [address] [place], [place] [address]      CBC Recent Labs  Lab 01/03/21 1121  WBC 6.8  HGB 14.5  HCT 45.3  PLT 245  MCV 91.0  MCH 29.1  MCHC 32.0  RDW 14.3  LYMPHSABS 1.9  MONOABS 0.7   EOSABS 0.1  BASOSABS 0.0    Chemistries  Recent Labs  Lab 01/03/21 1121  NA 134*  K 4.7  CL 100  CO2 24  GLUCOSE 161*  BUN 40*  CREATININE 2.59*  CALCIUM 9.4  AST 20  ALT 20  ALKPHOS 124  BILITOT 0.5   ------------------------------------------------------------------------------------------------------------------ estimated creatinine clearance is 27.1 mL/min (A) (by C-G formula based on SCr of 2.59 mg/dL (H)). ------------------------------------------------------------------------------------------------------------------ No results for input(s): HGBA1C in the last 72 hours. ------------------------------------------------------------------------------------------------------------------ No results for input(s): CHOL, HDL, LDLCALC, TRIG, CHOLHDL, LDLDIRECT in the last 72 hours. ------------------------------------------------------------------------------------------------------------------ No results for input(s): TSH, T4TOTAL, T3FREE, THYROIDAB in the last 72 hours.  Invalid input(s): FREET3 ------------------------------------------------------------------------------------------------------------------ No results for input(s): VITAMINB12, FOLATE, FERRITIN, TIBC, IRON, RETICCTPCT in the last 72 hours.  Coagulation profile No results for input(s): INR, PROTIME in the last 168 hours.  No results for input(s): DDIMER in the last 72 hours.  Cardiac Enzymes No results for input(s): CKMB, TROPONINI, MYOGLOBIN in the last 168 hours.  Invalid input(s): CK ------------------------------------------------------------------------------------------------------------------ Invalid input(s): [place]  Return for previously scheduled, F/U, Ski Polich PCP in january.  Time Spent in minutes  45 Time spent with patient included reviewing progress notes, labs, imaging studies, and discussing plan for follow up.   [place] Controlled Substance Database was reviewed by me for overdose risk  score (ORS)  This patient was seen by Jonetta Osgood, FNP-C in collaboration with Dr. Clayborn Bigness as a part of collaborative care agreement.   Jonetta Osgood M.D on 01/04/2021 at 12:42 AM   **Disclaimer: This note may have been dictated with voice recognition software. Similar sounding words can inadvertently be transcribed and this note may contain transcription errors which may not have been corrected upon publication of note.**

## 2021-01-04 ENCOUNTER — Encounter: Payer: Self-pay | Admitting: Nurse Practitioner

## 2021-01-05 ENCOUNTER — Other Ambulatory Visit: Payer: Self-pay

## 2021-01-05 ENCOUNTER — Ambulatory Visit
Admission: RE | Admit: 2021-01-05 | Discharge: 2021-01-05 | Disposition: A | Discharge status: Home / Self Care | Payer: Medicare Other | Source: Ambulatory Visit | Attending: Oncology | Admitting: Oncology

## 2021-01-05 ENCOUNTER — Encounter
Admission: RE | Admit: 2021-01-05 | Discharge: 2021-01-05 | Disposition: A | Discharge status: Home / Self Care | Payer: Medicare Other | Source: Ambulatory Visit | Attending: Oncology | Admitting: Oncology

## 2021-01-05 DIAGNOSIS — K429 Umbilical hernia without obstruction or gangrene: Secondary | ICD-10-CM | POA: Diagnosis not present

## 2021-01-05 DIAGNOSIS — C61 Malignant neoplasm of prostate: Secondary | ICD-10-CM | POA: Insufficient documentation

## 2021-01-05 DIAGNOSIS — I2510 Atherosclerotic heart disease of native coronary artery without angina pectoris: Secondary | ICD-10-CM | POA: Diagnosis not present

## 2021-01-05 DIAGNOSIS — I700 Atherosclerosis of aorta: Secondary | ICD-10-CM | POA: Diagnosis not present

## 2021-01-05 DIAGNOSIS — J984 Other disorders of lung: Secondary | ICD-10-CM | POA: Diagnosis not present

## 2021-01-05 DIAGNOSIS — M5134 Other intervertebral disc degeneration, thoracic region: Secondary | ICD-10-CM | POA: Diagnosis not present

## 2021-01-05 DIAGNOSIS — K7689 Other specified diseases of liver: Secondary | ICD-10-CM | POA: Diagnosis not present

## 2021-01-05 DIAGNOSIS — N200 Calculus of kidney: Secondary | ICD-10-CM | POA: Diagnosis not present

## 2021-01-05 MED ORDER — TECHNETIUM TC 99M MEDRONATE IV KIT
20.3500 | PACK | Freq: Once | INTRAVENOUS | Status: AC | PRN
  Administered 2021-01-05: 20.35 via INTRAVENOUS

## 2021-01-08 ENCOUNTER — Encounter: Payer: Self-pay | Admitting: Nurse Practitioner

## 2021-01-08 DIAGNOSIS — I82412 Acute embolism and thrombosis of left femoral vein: Secondary | ICD-10-CM | POA: Insufficient documentation

## 2021-01-08 DIAGNOSIS — G4733 Obstructive sleep apnea (adult) (pediatric): Secondary | ICD-10-CM | POA: Diagnosis not present

## 2021-01-08 DIAGNOSIS — I482 Chronic atrial fibrillation, unspecified: Secondary | ICD-10-CM | POA: Diagnosis not present

## 2021-01-08 DIAGNOSIS — E782 Mixed hyperlipidemia: Secondary | ICD-10-CM | POA: Diagnosis not present

## 2021-01-08 DIAGNOSIS — R600 Localized edema: Secondary | ICD-10-CM | POA: Diagnosis not present

## 2021-01-08 DIAGNOSIS — I10 Essential (primary) hypertension: Secondary | ICD-10-CM | POA: Diagnosis not present

## 2021-01-09 ENCOUNTER — Ambulatory Visit: Payer: Self-pay | Admitting: *Deleted

## 2021-01-12 ENCOUNTER — Other Ambulatory Visit: Payer: Self-pay

## 2021-01-12 ENCOUNTER — Inpatient Hospital Stay: Payer: Medicare Other

## 2021-01-12 ENCOUNTER — Telehealth: Payer: Self-pay | Admitting: Oncology

## 2021-01-12 ENCOUNTER — Encounter: Payer: Self-pay | Admitting: Oncology

## 2021-01-12 ENCOUNTER — Inpatient Hospital Stay (HOSPITAL_BASED_OUTPATIENT_CLINIC_OR_DEPARTMENT_OTHER): Payer: Medicare Other | Admitting: Oncology

## 2021-01-12 VITALS — BP 140/88 | HR 96 | Temp 97.8°F | Resp 18 | Wt 255.4 lb

## 2021-01-12 DIAGNOSIS — Z79818 Long term (current) use of other agents affecting estrogen receptors and estrogen levels: Secondary | ICD-10-CM

## 2021-01-12 DIAGNOSIS — C772 Secondary and unspecified malignant neoplasm of intra-abdominal lymph nodes: Secondary | ICD-10-CM | POA: Diagnosis not present

## 2021-01-12 DIAGNOSIS — I700 Atherosclerosis of aorta: Secondary | ICD-10-CM | POA: Diagnosis not present

## 2021-01-12 DIAGNOSIS — M2550 Pain in unspecified joint: Secondary | ICD-10-CM | POA: Diagnosis not present

## 2021-01-12 DIAGNOSIS — I4891 Unspecified atrial fibrillation: Secondary | ICD-10-CM | POA: Diagnosis not present

## 2021-01-12 DIAGNOSIS — Z7901 Long term (current) use of anticoagulants: Secondary | ICD-10-CM | POA: Diagnosis not present

## 2021-01-12 DIAGNOSIS — Z8673 Personal history of transient ischemic attack (TIA), and cerebral infarction without residual deficits: Secondary | ICD-10-CM | POA: Diagnosis not present

## 2021-01-12 DIAGNOSIS — Z87442 Personal history of urinary calculi: Secondary | ICD-10-CM | POA: Diagnosis not present

## 2021-01-12 DIAGNOSIS — Z79899 Other long term (current) drug therapy: Secondary | ICD-10-CM | POA: Diagnosis not present

## 2021-01-12 DIAGNOSIS — R590 Localized enlarged lymph nodes: Secondary | ICD-10-CM | POA: Diagnosis not present

## 2021-01-12 DIAGNOSIS — C61 Malignant neoplasm of prostate: Secondary | ICD-10-CM

## 2021-01-12 DIAGNOSIS — Z5111 Encounter for antineoplastic chemotherapy: Secondary | ICD-10-CM | POA: Diagnosis not present

## 2021-01-12 DIAGNOSIS — E119 Type 2 diabetes mellitus without complications: Secondary | ICD-10-CM | POA: Diagnosis not present

## 2021-01-12 DIAGNOSIS — I5022 Chronic systolic (congestive) heart failure: Secondary | ICD-10-CM | POA: Diagnosis not present

## 2021-01-12 DIAGNOSIS — K429 Umbilical hernia without obstruction or gangrene: Secondary | ICD-10-CM | POA: Diagnosis not present

## 2021-01-12 DIAGNOSIS — Z87891 Personal history of nicotine dependence: Secondary | ICD-10-CM | POA: Diagnosis not present

## 2021-01-12 DIAGNOSIS — M170 Bilateral primary osteoarthritis of knee: Secondary | ICD-10-CM | POA: Diagnosis not present

## 2021-01-12 DIAGNOSIS — K7689 Other specified diseases of liver: Secondary | ICD-10-CM | POA: Diagnosis not present

## 2021-01-12 DIAGNOSIS — Z833 Family history of diabetes mellitus: Secondary | ICD-10-CM | POA: Diagnosis not present

## 2021-01-12 DIAGNOSIS — Z800 Family history of malignant neoplasm of digestive organs: Secondary | ICD-10-CM | POA: Diagnosis not present

## 2021-01-12 DIAGNOSIS — Z8249 Family history of ischemic heart disease and other diseases of the circulatory system: Secondary | ICD-10-CM | POA: Diagnosis not present

## 2021-01-12 DIAGNOSIS — M79605 Pain in left leg: Secondary | ICD-10-CM | POA: Diagnosis not present

## 2021-01-12 MED ORDER — LEUPROLIDE ACETATE (6 MONTH) 45 MG ~~LOC~~ KIT
45.0000 mg | PACK | Freq: Once | SUBCUTANEOUS | Status: AC
  Administered 2021-01-12: 11:00:00 45 mg via SUBCUTANEOUS
  Filled 2021-01-12: qty 45

## 2021-01-12 NOTE — Telephone Encounter (Signed)
When did this message come through, I just talked to him this morning stating that next available appt is until the end of Jan and Dr. Tasia Catchings recommends for him to keep the appt. He agreed to coming in today.

## 2021-01-12 NOTE — Progress Notes (Signed)
Pt doing well. No concerns. Has some chronic left leg pain.

## 2021-01-12 NOTE — Telephone Encounter (Signed)
Patients wife left message with answering service to cancel appointment on 12/23 for a day next week.   Routing to team for follow-up.

## 2021-01-13 ENCOUNTER — Encounter: Payer: Self-pay | Admitting: Oncology
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
## 2021-01-19 DIAGNOSIS — G4733 Obstructive sleep apnea (adult) (pediatric): Secondary | ICD-10-CM | POA: Diagnosis not present

## 2021-01-24 ENCOUNTER — Other Ambulatory Visit (INDEPENDENT_AMBULATORY_CARE_PROVIDER_SITE_OTHER): Payer: Self-pay | Admitting: Vascular Surgery

## 2021-01-24 DIAGNOSIS — I998 Other disorder of circulatory system: Secondary | ICD-10-CM

## 2021-01-24 DIAGNOSIS — Z9862 Peripheral vascular angioplasty status: Secondary | ICD-10-CM

## 2021-01-25 ENCOUNTER — Ambulatory Visit: Payer: Medicare Other | Admitting: Podiatry

## 2021-01-25 ENCOUNTER — Encounter: Payer: Self-pay | Admitting: Podiatry

## 2021-01-25 ENCOUNTER — Other Ambulatory Visit: Payer: Self-pay

## 2021-01-25 DIAGNOSIS — B351 Tinea unguium: Secondary | ICD-10-CM

## 2021-01-25 DIAGNOSIS — E0843 Diabetes mellitus due to underlying condition with diabetic autonomic (poly)neuropathy: Secondary | ICD-10-CM

## 2021-01-25 DIAGNOSIS — M79675 Pain in left toe(s): Secondary | ICD-10-CM | POA: Diagnosis not present

## 2021-01-25 DIAGNOSIS — M79674 Pain in right toe(s): Secondary | ICD-10-CM | POA: Diagnosis not present

## 2021-01-25 NOTE — Progress Notes (Signed)
Complaint:  Visit Type: Patient returns to my office for continued preventative foot care services. Complaint: Patient states" my nails have grown long and thick and become painful to walk and wear shoes" Patient has been diagnosed with DM with no foot complications. The patient presents for preventative foot care services. No changes to ROS.    Podiatric Exam: Vascular: dorsalis pedis and posterior tibial pulses are weakly  palpable bilateral. Capillary return is immediate. Temperature gradient is WNL. Skin turgor WNL  Sensorium: Normal Semmes Weinstein monofilament test. Normal tactile sensation bilaterally. Nail Exam: Pt has thick disfigured discolored nails with subungual debris noted bilateral entire nail hallux through fifth toenails Ulcer Exam: There is no evidence of ulcer or pre-ulcerative changes or infection. Orthopedic Exam: Muscle tone and strength are WNL. No limitations in general ROM. No crepitus or effusions noted. Foot type and digits show no abnormalities. Bony prominences are unremarkable. Skin:  Porokeratosis sub 3 right asymptomatic..  No infection noted. No infection or ulcers  Diagnosis:  Onychomycosis, , Pain in right toe, pain in left toes  Porokeratosis  Treatment & Plan Procedures and Treatment: Consent by patient was obtained for treatment procedures. The patient understood the discussion of treatment and procedures well. All questions were answered thoroughly reviewed. Debridement of mycotic and hypertrophic toenails, 1 through 5 bilateral and clearing of subungual debris. No ulceration, no infection noted.     Return Visit-Office Procedure: Patient instructed to return to the office for a follow up visit 10 weeks  for continued evaluation and treatment.    Hervey Wedig DPM 

## 2021-01-26 ENCOUNTER — Ambulatory Visit (INDEPENDENT_AMBULATORY_CARE_PROVIDER_SITE_OTHER): Payer: Medicare Other | Admitting: Nurse Practitioner

## 2021-01-26 ENCOUNTER — Ambulatory Visit (INDEPENDENT_AMBULATORY_CARE_PROVIDER_SITE_OTHER): Payer: Medicare Other

## 2021-01-26 ENCOUNTER — Encounter (INDEPENDENT_AMBULATORY_CARE_PROVIDER_SITE_OTHER): Payer: Self-pay | Admitting: Nurse Practitioner

## 2021-01-26 VITALS — BP 145/73 | HR 88 | Resp 16 | Wt 254.0 lb

## 2021-01-26 DIAGNOSIS — Z9862 Peripheral vascular angioplasty status: Secondary | ICD-10-CM

## 2021-01-26 DIAGNOSIS — I998 Other disorder of circulatory system: Secondary | ICD-10-CM

## 2021-01-26 DIAGNOSIS — Z794 Long term (current) use of insulin: Secondary | ICD-10-CM

## 2021-01-26 DIAGNOSIS — I70222 Atherosclerosis of native arteries of extremities with rest pain, left leg: Secondary | ICD-10-CM | POA: Diagnosis not present

## 2021-01-26 DIAGNOSIS — M1612 Unilateral primary osteoarthritis, left hip: Secondary | ICD-10-CM | POA: Diagnosis not present

## 2021-01-26 DIAGNOSIS — E1122 Type 2 diabetes mellitus with diabetic chronic kidney disease: Secondary | ICD-10-CM | POA: Diagnosis not present

## 2021-01-26 DIAGNOSIS — N1832 Chronic kidney disease, stage 3b: Secondary | ICD-10-CM

## 2021-01-31 ENCOUNTER — Other Ambulatory Visit: Payer: Self-pay | Admitting: *Deleted

## 2021-01-31 DIAGNOSIS — I482 Chronic atrial fibrillation, unspecified: Secondary | ICD-10-CM | POA: Diagnosis not present

## 2021-01-31 NOTE — Patient Outreach (Signed)
[facility]) Care Management  [place]  01/31/2021   [name] [birth year] [id number]    Outgoing call placed to member/wife, member not available but spoke to wife.  Denies any urgent concerns, encouraged to contact this care manager with questions.    Encounter Medications:  Outpatient Encounter Medications as of 01/31/2021  Medication Sig Note   albuterol (VENTOLIN HFA) 108 (90 Base) MCG/ACT inhaler Inhale 2 puffs into the lungs every 4 (four) hours as needed for wheezing or shortness of breath.    amLODipine (NORVASC) 2.5 MG tablet TAKE 1 TABLET(2.5 MG) BY MOUTH DAILY 12/25/2020: LF 11.19.22   apixaban (ELIQUIS) 5 MG TABS tablet Take 1 tablet (5 mg total) by mouth 2 (two) times daily.    aspirin EC 81 MG EC tablet Take 1 tablet (81 mg total) by mouth daily. Swallow whole.    atorvastatin (LIPITOR) 10 MG tablet Take 1 tablet (10 mg total) by mouth daily. 12/25/2020: LF 10.27.22   Capsaicin 0.025 % PTCH Place 1 patch onto the skin daily as needed (pain.). (Patient not taking: Reported on 01/12/2021)    FARXIGA 10 MG TABS tablet TAKE 1 TABLET(10 MG) BY MOUTH DAILY BEFORE BREAKFAST 12/25/2020: LF 11.19.22   ferrous sulfate 325 (65 FE) MG tablet Take 325 mg by mouth daily.  12/25/2020: LF 10.27.22   glucose blood (ONETOUCH VERIO) test strip USE ONCE DAILY. DX E11.65.    insulin glargine, 2 Unit Dial, (TOUJEO MAX SOLOSTAR) 300 UNIT/ML Solostar Pen Inject 10 Units into the skin daily.    Insulin Pen Needle (PEN NEEDLES 3/16") 31G X 5 MM MISC To use daily with basal insulin injections    metoprolol tartrate (LOPRESSOR) 25 MG tablet Take 1 tablet (25 mg total) by mouth 2 (two) times daily. 12/25/2020: LF 11.19.22   pantoprazole (PROTONIX) 40 MG tablet Take 1 tablet (40 mg total) by mouth daily. 12/25/2020: LF 11.14.22   Tiotropium Bromide Monohydrate (SPIRIVA RESPIMAT) 2.5 MCG/ACT AERS Inhale 2 puffs into the lungs daily. 12/25/2020: LF 12.2.22   vitamin B-12  (CYANOCOBALAMIN) 1000 MCG tablet Take 1 tablet (1,000 mcg total) by mouth daily. 12/25/2020: Not on pt med list from pharmacy   No facility-administered encounter medications on file as of 01/31/2021.    Functional Status:  In your present state of health, do you have any difficulty performing the following activities: 12/25/2020 12/25/2020  Hearing? N N  Vision? N N  Difficulty concentrating or making decisions? N N  Walking or climbing stairs? N N  Dressing or bathing? N N  Doing errands, shopping? N -  Preparing Food and eating ? - -  Using the Toilet? - -  In the past six months, have you accidently leaked urine? - -  Do you have problems with loss of bowel control? - -  Managing your Medications? - -  Managing your Finances? - -  Housekeeping or managing your Housekeeping? - -  Some recent data might be hidden    Fall/Depression Screening: Fall Risk  12/08/2020 10/06/2020 03/31/2020  Falls in the past year? 0 0 0  Number falls in past yr: - - -  Injury with Fall? - - -  Comment - - -  Risk for fall due to : No Fall Risks No Fall Risks -  Follow up Falls evaluation completed Falls evaluation completed -   PHQ 2/9 Scores 12/08/2020 10/06/2020 03/31/2020 02/04/2020 12/03/2019 11/17/2019 10/29/2019  PHQ - 2 Score 0 0 0 0 0 0 0  Exception Documentation - - - - - - Other- indicate reason in comment box  Not completed - - - - - - According to wife and primary caregiver who performed the assessment.    Assessment:   Care Plan Care Plan : General Plan of Care (Adult)  Updates made by [name], RN since 01/31/2021 12:00 AM     Problem: Knowledge deficit regarding chronic disease management (DM, A-fib, CHF and prostate cancer) as evidenced by elevated A1C and PSA   Priority: High     Long-Range Goal: Member will show management of DM and prostate cancer evidenced by decreaesed A1C and PSA over the next 6 months   Start Date: 11/03/2020  Expected End Date: 05/02/2021  Recent  Progress: On track  Priority: High  Note:   Current Barriers:  Knowledge Deficits related to plan of care for management of Atrial Fibrillation, CHF, DMII, and prostate cancer  Chronic Disease Management support and education needs related to Atrial Fibrillation, CHF, DMII, and prostate cancer  RNCM Clinical Goal(s):  Patient will verbalize understanding of plan for management of Atrial Fibrillation, CHF, DMII, and Gout as evidenced by member/wife reporting stability in health take all medications exactly as prescribed and will call provider for medication related questions as evidenced by reported adherence by wife/member    demonstrate understanding of rationale for each prescribed medication as evidenced by for cancer, CHF, A-fib and DM treatment.    attend all scheduled medical appointments: PCP, ONcology, and neurology as evidenced by PCP, ONcology, cardiology, and neurology        continue to work with L[place] and/or Social Worker to address care management and care coordination needs related to Atrial Fibrillation, CHF, DMII, and Gout as evidenced by adherence to CM Team Scheduled appointments     through collaboration with RN Care manager, provider, and care team.   Interventions: Inter-disciplinary care team collaboration (see longitudinal plan of care) Evaluation of current treatment plan related to  self management and patient's adherence to plan as established by provider   Diabetes:  (Status: Goal on Track (progressing): YES.) Lab Results  Component Value Date   HGBA1C 8.4 (A) 08/18/2020  Assessed patient's understanding of A1c goal: <7% Provided education to patient about basic DM disease process; Reviewed medications with patient and discussed importance of medication adherence;        Reviewed prescribed diet with patient low carb/sugar; Counseled on importance of regular laboratory monitoring as prescribed;        Advised patient, providing education and  rationale, to check cbg daily and record         Oncology:  (Status: Goal on Track (progressing): YES.) Assessment of understanding of oncology diagnosis:  Assessed patient understanding of cancer diagnosis and recommended treatment plan, Assessed available transportation to appointments and treatments. Has consistent/reliable transportation: Yes, and Assessed support system. Has consistent/reliable family or other support: Yes  Heart Failure Interventions: Basic overview and discussion of pathophysiology of Heart Failure reviewed; Provided education on low sodium diet; Discussed importance of daily weight and advised patient to weigh and record daily; Reviewed role of diuretics in prevention of fluid overload and management of heart failure; Discussed the importance of keeping all appointments with provider;  AFIB Interventions:    Counseled on increased risk of stroke due to Afib and benefits of anticoagulation for stroke prevention; Reviewed importance of adherence to anticoagulant exactly as prescribed; Counseled on seeking medical attention after a head injury or if there is blood in  the urine/stool; Afib action plan reviewed;   Update 11/11 - Per wife, member has been doing well.  State he has continued to have intermittent chest discomfort as well as shortness of breath.  Was seen by cardiology yesterday, plan for pacemake placement on 11/29.  She is unclear about reason for device, educated on member's history and the goal of management of A-fib/CHF after intervention.  She verbalizes understanding.  She does not drive often, encouraged to call son and daughter in law to secure transportation to hospital.  Blood pressure today was 129/85, HR 73, blood sugar 140.  She is unable to find his weight for today, he is currently not available.  Noted that neurology appointment for 11/3 was canceled, will assist with rescheduling after discharge home following pacer placement.   Patient  Goals/Self-Care Activities: Patient will self administer medications as prescribed as evidenced by self report/primary caregiver report  Patient will attend all scheduled provider appointments as evidenced by clinician review of documented attendance to scheduled appointments and patient/caregiver report Patient will call pharmacy for medication refills as evidenced by patient report and review of pharmacy fill history as appropriate Patient will continue to perform ADL's independently as evidenced by patient/caregiver report   Update 12/8 - Noted that member was hospitalized twice in the last month, one for planned pacemaker implantation and for limb ischemia requiring vascular surgery.  Spoke with member and wife, they both report member is doing well since surgery.  Conference call placed vascular provider to request follow up appointment, they will call member/wife directly to schedule.  He already has appointment scheduled with PCP on 12/14 for labs and hospital follow up as well as tests scheduled on 12/16 for cancer scans.  Wife repeatedly state she is overwhelmed with managing member's care however is reluctant to ask son and daughter in law for help.  Encouraged to contact them and make them aware of upcoming appointments, advised to provide this care manager's contact information for discussion as well.   Update 12/14 - Wife report member is out driving today, went to MD office for labs, will return home to pick her up prior to PCP appointment.  She state he has been doing well, has not had any further pain.  Conference calls placed to Vascular and neurology office, appointments made for 1/6 at 1pm (vascular) and 1/19 at 145pm (neurology).  Reminded of CT scans scheduled for Friday, 12/16.   Update 1/11 - Per wife, member doing better.  He is still going to work for a few hours several days a week.  Was seen by cardiology on 12/19, cardiac rehab was discussed.  Wife is unaware of this  recommendation, state he has another appointment with their office today or tomorrow.  PCP appointment completed on 12/14, insulin dose was increased due to elevated blood sugars, per wife numbers are better, unable to find notebook with all readings but state it was 150 yesterday.  He will follo wup with PCP again on 1/27.  He attended appointment with oncology on 12/23, notified that PSA has increased again to 60, no signs of metastasis to bone.  Member does not wish to have any further treatment.  Follow up from vascular surgery completed on 1/6, no issues noted.  Reminded of upcoming appointment with neurology on 1/19, she will accompany him to this appointment.  Encouraged again to have son and daughter in law more involved in care, she state they have been helping more with management of both of their  conditions.          Plan:  Follow-up: Patient agrees to Care Plan and Follow-up. Follow-up in 1 month(s).  [name], RN, MSN, [place] Manager [phone]

## 2021-02-02 ENCOUNTER — Other Ambulatory Visit: Payer: Self-pay | Admitting: Nurse Practitioner

## 2021-02-02 ENCOUNTER — Other Ambulatory Visit: Payer: Self-pay | Admitting: Physician Assistant

## 2021-02-02 DIAGNOSIS — K219 Gastro-esophageal reflux disease without esophagitis: Secondary | ICD-10-CM

## 2021-02-02 DIAGNOSIS — I10 Essential (primary) hypertension: Secondary | ICD-10-CM

## 2021-02-02 DIAGNOSIS — Z794 Long term (current) use of insulin: Secondary | ICD-10-CM

## 2021-02-04 ENCOUNTER — Encounter (INDEPENDENT_AMBULATORY_CARE_PROVIDER_SITE_OTHER): Payer: Self-pay | Admitting: Nurse Practitioner

## 2021-02-04 NOTE — Progress Notes (Signed)
Subjective:    Patient ID: [name], male    DOB: 1933/04/27, 88 y.o.   MRN: [id number] Chief Complaint  Patient presents with   Follow-up    [name] is an 88 year old male returns to the office for followup and review status post angiogram with intervention.   12/25/20:             1.  Ultrasound guidance for vascular access right femoral artery             2.  Catheter placement into left SFA from right femoral approach             3.  Aortogram and selective left lower extremity angiogram             4.  Mechanical thrombectomy to the left popliteal artery, left anterior tibial artery, and left peroneal artery with the penumbra CAT 7 device             5.  Percutaneous transluminal angioplasty of the left peroneal artery with 2.5 mm diameter by 30 cm length angioplasty balloon             6.  StarClose closure device right femoral artery  The patient notes improvement in the lower extremity symptoms. No interval shortening of the patient's claudication distance or rest pain symptoms. Previous wounds have now healed.  No new ulcers or wounds have occurred since the last visit.  There have been no significant changes to the patient's overall health care.  The patient denies amaurosis fugax or recent TIA symptoms. There are no recent neurological changes noted. The patient denies history of DVT, PE or superficial thrombophlebitis. The patient denies recent episodes of angina or shortness of breath.   ABI's Rt=1.25 and Lt=1.39  (No previous ABIs) Duplex US of the bilateral lower extremities shows triphasic waveforms with good toe waveforms bilaterally    Review of Systems  Cardiovascular:  Negative for leg swelling.  All other systems reviewed and are negative.     Objective:   Physical Exam Vitals reviewed.  HENT:     Head: Normocephalic.  Cardiovascular:     Rate and Rhythm: Normal rate.     Pulses: Normal pulses.  Pulmonary:     Effort: Pulmonary  effort is normal.  Skin:    General: Skin is warm and dry.  Neurological:     Mental Status: He is alert and oriented to person, place, and time.  Psychiatric:        Mood and Affect: Mood normal.        Behavior: Behavior normal.        Thought Content: Thought content normal.        Judgment: Judgment normal.    BP (!) 145/73 (BP Location: Right Arm)    Pulse 88    Resp 16    Wt 254 lb (115.2 kg)    BMI 34.45 kg/m   Past Medical History:  Diagnosis Date   Atrial fibrillation (HCC)    CHF (congestive heart failure) (HCC)    Diabetes (HCC)    Hearing loss    High blood pressure    History of bladder problems    Kidney stones 11/22/2014   Prostate cancer ([place])    Prostate cancer ([place])    Stroke [facility])     Social History   Socioeconomic History   Marital status: Married    Spouse name: Gyasi Hazzard   Number of children: 2  Years of education: 58   Highest education level: 12th grade  Occupational History   Occupation: [employer]  Tobacco Use   Smoking status: Former    Years: 16.00    Types: Cigarettes   Smokeless tobacco: Never  Vaping Use   Vaping Use: Never used  Substance and Sexual Activity   Alcohol use: No   Drug use: No   Sexual activity: Not Currently  Other Topics Concern   Not on file  Social History Narrative   Not on file   Social Determinants of Health   Financial Resource Strain: Not on file  Food Insecurity: Not on file  Transportation Needs: Not on file  Physical Activity: Not on file  Stress: Not on file  Social Connections: Not on file  Intimate Partner Violence: Not on file    Past Surgical History:  Procedure Laterality Date   CATARACT EXTRACTION     COLONOSCOPY WITH PROPOFOL N/A 07/02/2019   Procedure: COLONOSCOPY WITH PROPOFOL;  Surgeon: Lin Landsman, MD;  Location: [facility] ENDOSCOPY;  Service: Gastroenterology;  Laterality: N/A;   IR CT HEAD LTD  08/27/2019   IR PERCUTANEOUS ART THROMBECTOMY/INFUSION INTRACRANIAL INC DIAG  ANGIO  08/27/2019       IR PERCUTANEOUS ART THROMBECTOMY/INFUSION INTRACRANIAL INC DIAG ANGIO  08/27/2019   LOWER EXTREMITY ANGIOGRAPHY Left 12/25/2020   Procedure: Lower Extremity Angiography;  Surgeon: Algernon Huxley, MD;  Location: [facility];  Service: Cardiovascular;  Laterality: Left;   PACEMAKER LEADLESS INSERTION N/A 12/19/2020   Procedure: PACEMAKER LEADLESS INSERTION;  Surgeon: Isaias Cowman, MD;  Location: [place] [facility];  Service: Cardiovascular;  Laterality: N/A;   PROSTATE CANCER     PROSTATE SURGERY     RADIOLOGY WITH ANESTHESIA N/A 08/27/2019   Procedure: IR WITH ANESTHESIA;  Surgeon: Luanne Bras, MD;  Location: [place];  Service: Radiology;  Laterality: N/A;    Family History  Problem Relation Age of Onset   Colon cancer Mother    Diabetes Other    High blood pressure Other    Prostate cancer Brother    Diabetes Brother     No Known Allergies  CBC Latest Ref Rng & Units 01/03/2021 12/27/2020 12/26/2020  WBC 4.0 - 10.5 K/uL 6.8 6.2 5.6  Hemoglobin 13.0 - 17.0 g/dL 14.5 12.7(L) 13.0  Hematocrit 39.0 - 52.0 % 45.3 40.9 42.8  Platelets 150 - 400 K/uL 245 168 184      CMP     Component Value Date/Time   NA 134 (L) 01/03/2021 1121   NA 144 06/11/2019 0953   NA 138 10/13/2013 0415   K 4.7 01/03/2021 1121   K 4.0 10/13/2013 0415   CL 100 01/03/2021 1121   CL 106 10/13/2013 0415   CO2 24 01/03/2021 1121   CO2 27 10/13/2013 0415   GLUCOSE 161 (H) 01/03/2021 1121   GLUCOSE 97 10/13/2013 0415   BUN 40 (H) 01/03/2021 1121   BUN 37 (H) 06/11/2019 0953   BUN 22 (H) 10/13/2013 0415   CREATININE 2.59 (H) 01/03/2021 1121   CREATININE 1.65 (H) 10/13/2013 0415   CALCIUM 9.4 01/03/2021 1121   CALCIUM 8.2 (L) 10/13/2013 0415   PROT 7.9 01/03/2021 1121   PROT 7.3 06/11/2019 0953   PROT 7.4 10/12/2013 0732   ALBUMIN 3.8 01/03/2021 1121   ALBUMIN 4.4 06/11/2019 0953   ALBUMIN 3.0 (L) 10/12/2013 0732   AST 20 01/03/2021 1121   AST 22 10/12/2013  0732   ALT 20 01/03/2021 1121  ALT 29 10/12/2013 0732   ALKPHOS 124 01/03/2021 1121   ALKPHOS 128 (H) 10/12/2013 0732   BILITOT 0.5 01/03/2021 1121   BILITOT 0.6 06/11/2019 0953   BILITOT 0.3 10/12/2013 0732   GFRNONAA 24 (L) 01/03/2021 1121   GFRNONAA 39 (L) 10/13/2013 0415   GFRAA 28 (L) 10/12/2019 1035   GFRAA 45 (L) 10/13/2013 0415     VAS [place] ABI WITH/WO TBI  Result Date: 01/31/2021  LOWER EXTREMITY DOPPLER STUDY Patient Name:  OBINNA EHRESMAN  Date of Exam:   01/26/2021 Medical Rec #: [id number]          Accession #:    [id number] Date of Birth: 1933/02/10           Patient Gender: M Patient Age:   88 years Exam Location:  [facility] Procedure:      VAS [place] ABI WITH/WO TBI Referring Phys: --------------------------------------------------------------------------------  Indications: Peripheral artery disease.  Vascular Interventions: 12/5 2022 Left thrombectomy popliteal artery. Performing Technologist: Concha Norway RVT  Examination Guidelines: A complete evaluation includes at minimum, Doppler waveform signals and systolic blood pressure reading at the level of bilateral brachial, anterior tibial, and posterior tibial arteries, when vessel segments are accessible. Bilateral testing is considered an integral part of a complete examination. Photoelectric Plethysmograph (PPG) waveforms and toe systolic pressure readings are included as required and additional duplex testing as needed. Limited examinations for reoccurring indications may be performed as noted.  Summary: Right: Resting right ankle-brachial index is within normal range. No evidence of significant right lower extremity arterial disease. The right toe-brachial index is normal. Left: Resting left ankle-brachial index is within normal range. No evidence of significant left lower extremity arterial disease. The left toe-brachial index is normal.  *See table(s) above for measurements and observations.  Electronically signed by  Leotis Pain MD on 01/31/2021 at 10:08:03 AM.   Final        Assessment & Plan:   1. Critical limb ischemia of left lower extremity (HCC) Recommend:  The patient is status post successful angiogram with intervention.  The patient reports that the claudication symptoms and leg pain is essentially gone.   The patient denies lifestyle limiting changes at this point in time.  No further invasive studies, angiography or surgery at this time The patient should continue walking and begin a more formal exercise program.  The patient should continue antiplatelet therapy and aggressive treatment of the lipid abnormalities   The patient should continue wearing graduated compression socks 10-15 mmHg strength to control the mild edema.  Patient should undergo noninvasive studies as ordered. The patient will follow up with me after the studies.    2. Type 2 diabetes mellitus with stage 3b chronic kidney disease, with long-term current use of insulin ([place]) Continue hypoglycemic medications as already ordered, these medications have been reviewed and there are no changes at this time.  Hgb A1C to be monitored as already arranged by primary service   3. Primary osteoarthritis of left hip Continue NSAID medications as already ordered, these medications have been reviewed and there are no changes at this time.  Continued activity and therapy was stressed.    Current Outpatient Medications on File Prior to Visit  Medication Sig Dispense Refill   albuterol (VENTOLIN HFA) 108 (90 Base) MCG/ACT inhaler Inhale 2 puffs into the lungs every 4 (four) hours as needed for wheezing or shortness of breath. 18 g 1   apixaban (ELIQUIS) 5 MG TABS tablet Take 1 tablet (  5 mg total) by mouth 2 (two) times daily. 60 tablet 0   aspirin EC 81 MG EC tablet Take 1 tablet (81 mg total) by mouth daily. Swallow whole.     atorvastatin (LIPITOR) 10 MG tablet Take 1 tablet (10 mg total) by mouth daily. 90 tablet 1   ferrous  sulfate 325 (65 FE) MG tablet Take 325 mg by mouth daily.      glucose blood (ONETOUCH VERIO) test strip USE ONCE DAILY. DX E11.65. 100 each 3   insulin glargine, 2 Unit Dial, (TOUJEO MAX SOLOSTAR) 300 UNIT/ML Solostar Pen Inject 10 Units into the skin daily.     Insulin Pen Needle (PEN NEEDLES 3/16") 31G X 5 MM MISC To use daily with basal insulin injections 90 each 3   metoprolol tartrate (LOPRESSOR) 25 MG tablet Take 1 tablet (25 mg total) by mouth 2 (two) times daily. 60 tablet 5   Tiotropium Bromide Monohydrate (SPIRIVA RESPIMAT) 2.5 MCG/ACT AERS Inhale 2 puffs into the lungs daily. 4 g 3   vitamin B-12 (CYANOCOBALAMIN) 1000 MCG tablet Take 1 tablet (1,000 mcg total) by mouth daily. 30 tablet 0   Capsaicin 0.025 % PTCH Place 1 patch onto the skin daily as needed (pain.). (Patient not taking: Reported on 01/12/2021)     No current facility-administered medications on file prior to visit.    There are no Patient Instructions on file for this visit. No follow-ups on file.   Kris Hartmann, NP

## 2021-02-05 ENCOUNTER — Other Ambulatory Visit: Payer: Self-pay

## 2021-02-05 ENCOUNTER — Telehealth: Payer: Self-pay

## 2021-02-05 DIAGNOSIS — Z794 Long term (current) use of insulin: Secondary | ICD-10-CM

## 2021-02-05 DIAGNOSIS — E1122 Type 2 diabetes mellitus with diabetic chronic kidney disease: Secondary | ICD-10-CM

## 2021-02-05 MED ORDER — TOUJEO MAX SOLOSTAR 300 UNIT/ML ~~LOC~~ SOPN: 10.0000 [IU] | PEN_INJECTOR | Freq: Every day | SUBCUTANEOUS | Status: DC

## 2021-02-05 MED ORDER — TOUJEO MAX SOLOSTAR 300 UNIT/ML ~~LOC~~ SOPN: 10.0000 [IU] | PEN_INJECTOR | Freq: Every day | SUBCUTANEOUS | 3 refills | Status: DC

## 2021-02-05 NOTE — Telephone Encounter (Signed)
error 

## 2021-02-08 ENCOUNTER — Ambulatory Visit: Payer: Medicare Other | Admitting: Adult Health

## 2021-02-16 ENCOUNTER — Encounter: Payer: Self-pay | Admitting: Nurse Practitioner

## 2021-02-16 ENCOUNTER — Ambulatory Visit (INDEPENDENT_AMBULATORY_CARE_PROVIDER_SITE_OTHER): Payer: Medicare Other | Admitting: Nurse Practitioner

## 2021-02-16 ENCOUNTER — Other Ambulatory Visit: Payer: Self-pay

## 2021-02-16 VITALS — BP 124/80 | HR 100 | Temp 98.4°F | Resp 16 | Ht 72.0 in | Wt 252.2 lb

## 2021-02-16 DIAGNOSIS — N1832 Chronic kidney disease, stage 3b: Secondary | ICD-10-CM | POA: Diagnosis not present

## 2021-02-16 DIAGNOSIS — E6609 Other obesity due to excess calories: Secondary | ICD-10-CM | POA: Diagnosis not present

## 2021-02-16 DIAGNOSIS — E1122 Type 2 diabetes mellitus with diabetic chronic kidney disease: Secondary | ICD-10-CM

## 2021-02-16 DIAGNOSIS — I10 Essential (primary) hypertension: Secondary | ICD-10-CM | POA: Diagnosis not present

## 2021-02-16 DIAGNOSIS — Z794 Long term (current) use of insulin: Secondary | ICD-10-CM

## 2021-02-16 DIAGNOSIS — Z6834 Body mass index (BMI) 34.0-34.9, adult: Secondary | ICD-10-CM

## 2021-02-16 LAB — POCT GLYCOSYLATED HEMOGLOBIN (HGB A1C): Hemoglobin A1C: 8.3 % — AB (ref 4.0–5.6)

## 2021-02-16 MED ORDER — RYBELSUS 3 MG PO TABS: 3.0000 mg | ORAL_TABLET | Freq: Every day | ORAL | 3 refills | Status: DC

## 2021-02-16 NOTE — Progress Notes (Signed)
[employer] [facility] [employer] [place],  [address]  Internal MEDICINE  Office Visit Note  Patient Name: [name]  [id number]  [id number]  Date of Service: 02/16/2021  Chief Complaint  Patient presents with   Follow-up   Diabetes   Hypertension    HPI [name] presents for a follow up visit for diabetes and hypertension and is accompanied by his wife.  He is due to have his A1C checked. His A1C was 8.3 which is no change since his previous A1C. He is currently on toujeo 10 units daily and farxiga 10 mg daily. He has been eating bread every day and drinking mango juice. He eats a lot of oatmeal cookies as well.  His blood pressure is well controlled with current medication.    Current Medication: Outpatient Encounter Medications as of 02/16/2021  Medication Sig Note   albuterol (VENTOLIN HFA) 108 (90 Base) MCG/ACT inhaler Inhale 2 puffs into the lungs every 4 (four) hours as needed for wheezing or shortness of breath.    amLODipine (NORVASC) 2.5 MG tablet TAKE 1 TABLET(2.5 MG) BY MOUTH DAILY    apixaban (ELIQUIS) 5 MG TABS tablet Take 1 tablet (5 mg total) by mouth 2 (two) times daily.    aspirin EC 81 MG EC tablet Take 1 tablet (81 mg total) by mouth daily. Swallow whole.    atorvastatin (LIPITOR) 10 MG tablet Take 1 tablet (10 mg total) by mouth daily. 12/25/2020: LF 10.27.22   FARXIGA 10 MG TABS tablet TAKE 1 TABLET(10 MG) BY MOUTH DAILY BEFORE AND BREAKFAST    ferrous sulfate 325 (65 FE) MG tablet Take 325 mg by mouth daily.  12/25/2020: LF 10.27.22   glucose blood (ONETOUCH VERIO) test strip USE ONCE DAILY. DX E11.65.    insulin glargine, 2 Unit Dial, (TOUJEO MAX SOLOSTAR) 300 UNIT/ML Solostar Pen Inject 10 Units into the skin daily.    Insulin Pen Needle (PEN NEEDLES 3/16") 31G X 5 MM MISC To use daily with basal insulin injections    metoprolol tartrate (LOPRESSOR) 25 MG tablet Take 1 tablet (25 mg total) by mouth 2 (two) times daily. 12/25/2020: LF 11.19.22    pantoprazole (PROTONIX) 40 MG tablet TAKE 1 TABLET(40 MG) BY MOUTH DAILY    [START ON 03/16/2021] Semaglutide (RYBELSUS) 3 MG TABS Take 3 mg by mouth daily before breakfast.    Tiotropium Bromide Monohydrate (SPIRIVA RESPIMAT) 2.5 MCG/ACT AERS Inhale 2 puffs into the lungs daily. 12/25/2020: LF 12.2.22   vitamin B-12 (CYANOCOBALAMIN) 1000 MCG tablet Take 1 tablet (1,000 mcg total) by mouth daily. 12/25/2020: Not on pt med list from pharmacy   [DISCONTINUED] Capsaicin 0.025 % PTCH Place 1 patch onto the skin daily as needed (pain.). (Patient not taking: Reported on 01/12/2021)    No facility-administered encounter medications on file as of 02/16/2021.    Surgical History: Past Surgical History:  Procedure Laterality Date   CATARACT EXTRACTION     COLONOSCOPY WITH PROPOFOL N/A 07/02/2019   Procedure: COLONOSCOPY WITH PROPOFOL;  Surgeon: Lin Landsman, MD;  [facility]: [facility] ENDOSCOPY;  Service: Gastroenterology;  Laterality: N/A;   IR CT HEAD LTD  08/27/2019   IR PERCUTANEOUS ART THROMBECTOMY/INFUSION INTRACRANIAL INC DIAG ANGIO  08/27/2019       IR PERCUTANEOUS ART THROMBECTOMY/INFUSION INTRACRANIAL INC DIAG ANGIO  08/27/2019   LOWER EXTREMITY ANGIOGRAPHY Left 12/25/2020   Procedure: Lower Extremity Angiography;  Surgeon: Algernon Huxley, MD;  Location: [place] CV LAB;  Service: Cardiovascular;  Laterality: Left;  PACEMAKER LEADLESS INSERTION N/A 12/19/2020   Procedure: PACEMAKER LEADLESS INSERTION;  Surgeon: Isaias Cowman, MD;  Location: [place] CV LAB;  Service: Cardiovascular;  Laterality: N/A;   PROSTATE CANCER     PROSTATE SURGERY     RADIOLOGY WITH ANESTHESIA N/A 08/27/2019   Procedure: IR WITH ANESTHESIA;  Surgeon: Luanne Bras, MD;  Location: [place];  Service: Radiology;  Laterality: N/A;    Medical History: Past Medical History:  Diagnosis Date   Atrial fibrillation (HCC)    CHF (congestive heart failure) (HCC)    Diabetes ([place])    Hearing loss    High blood  pressure    History of bladder problems    Kidney stones 11/22/2014   Prostate cancer ([place])    Prostate cancer ([place])    Stroke ([place])     Family History: Family History  Problem Relation Age of Onset   Colon cancer Mother    Diabetes Other    High blood pressure Other    Prostate cancer Brother    Diabetes Brother     Social History   Socioeconomic History   Marital status: Married    Spouse name: Ott Zimmerle   Number of children: 2   Years of education: 12   Highest education level: 12th grade  Occupational History   Occupation: [employer]  Tobacco Use   Smoking status: Former    Years: 16.00    Types: Cigarettes   Smokeless tobacco: Never  Vaping Use   Vaping Use: Never used  Substance and Sexual Activity   Alcohol use: No   Drug use: No   Sexual activity: Not Currently  Other Topics Concern   Not on file  Social History Narrative   Not on file   Social Determinants of Health   Financial Resource Strain: Not on file  Food Insecurity: Not on file  Transportation Needs: Not on file  Physical Activity: Not on file  Stress: Not on file  Social Connections: Not on file  Intimate Partner Violence: Not on file      Review of Systems  Constitutional:  Negative for chills, fatigue and unexpected weight change.  HENT:  Negative for congestion, rhinorrhea, sneezing and sore throat.   Eyes:  Negative for redness.  Respiratory:  Negative for cough, chest tightness and shortness of breath.   Cardiovascular:  Negative for chest pain and palpitations.  Gastrointestinal:  Negative for abdominal pain, constipation, diarrhea, nausea and vomiting.  Genitourinary:  Negative for dysuria and frequency.  Musculoskeletal:  Negative for arthralgias, back pain, joint swelling and neck pain.  Skin:  Negative for rash.  Neurological: Negative.  Negative for tremors and numbness.  Hematological:  Negative for adenopathy. Does not bruise/bleed easily.  Psychiatric/Behavioral:   Negative for behavioral problems (Depression), sleep disturbance and suicidal ideas. The patient is not nervous/anxious.    Vital Signs: BP 124/80    Pulse 100    Temp 98.4 F (36.9 C)    Resp 16    Ht 6' (1.829 m)    Wt 252 lb 3.2 oz (114.4 kg)    SpO2 96%    BMI 34.20 kg/m    Physical Exam Vitals reviewed.  Constitutional:      General: He is not in acute distress.    Appearance: Normal appearance. He is obese. He is not ill-appearing.  HENT:     Head: Normocephalic and atraumatic.  Eyes:     Pupils: Pupils are equal, round, and reactive to light.  Cardiovascular:  Rate and Rhythm: Normal rate and regular rhythm.  Pulmonary:     Effort: Pulmonary effort is normal. No respiratory distress.  Neurological:     Mental Status: He is alert and oriented to person, place, and time.  Psychiatric:        Mood and Affect: Mood normal.        Behavior: Behavior normal.       Assessment/Plan: 1. Type 2 diabetes mellitus with stage 3b chronic kidney disease, with long-term current use of insulin (HCC) No change in A1C, still 8.3. add rybelsus 3 mg daily. Toujeo increased to 16 units daily. Follow up in 1 month. 1 month sample of rybelsus provided to patient. Additional refill sent to pharmacy.  - POCT HgB A1C - Semaglutide (RYBELSUS) 3 MG TABS; Take 3 mg by mouth daily before breakfast.  Dispense: 30 tablet; Refill: 3  2. Essential hypertension Blood pressure is well controlled, continue medication as prescribed  3. Stage 3b chronic kidney disease ([place]) Patient is on  farxiga, working on controlling blood glucose which will help his kidneys.   4. Class 1 obesity due to excess calories with serious comorbidity and body mass index (BMI) of 34.0 to 34.9 in adult Discussed decreasing intake of bread and sweets, and mango juice. Discussed diet modifications at length. Rybelsus has the potential to aid in weight loss for him.  Obesity Counseling: Risk Assessment: An assessment of  behavioral risk factors was made today and includes lack of exercise sedentary lifestyle, lack of portion control and poor dietary habits. The patient is being treated for type 2 diabetes.   Risk Modification Advice: The patient was counseled on portion control guidelines. Safe recommendations on caloric restriction discussed with patient. Sample meals plans were provided. General guidelines on diet and lifestyle modifications were discussed at length. Introducing physical activity as tolerated and at the patient's ability level is recommended.      General Counseling: yovan leeman understanding of the findings of todays visit and agrees with plan of treatment. I have discussed any further diagnostic evaluation that may be needed or ordered today. We also reviewed his medications today. he has been encouraged to call the office with any questions or concerns that should arise related to todays visit.    Orders Placed This Encounter  Procedures   POCT HgB A1C    Meds ordered this encounter  Medications   Semaglutide (RYBELSUS) 3 MG TABS    Sig: Take 3 mg by mouth daily before breakfast.    Dispense:  30 tablet    Refill:  3    Samples given x1 month, will need first fill in 4 weeks    Return in about 1 month (around 03/19/2021) for F/U, eval new med, [place] PCP.   Total time spent:30 Minutes Time spent includes review of chart, medications, test results, and follow up plan with the patient.   [place] Controlled Substance Database was reviewed by me.  This patient was seen by Jonetta Osgood, FNP-C in collaboration with Dr. Clayborn Bigness as a part of collaborative care agreement.   Viera Okonski R. Valetta Fuller, MSN, FNP-C Internal medicine

## 2021-02-19 DIAGNOSIS — G4733 Obstructive sleep apnea (adult) (pediatric): Secondary | ICD-10-CM | POA: Diagnosis not present

## 2021-02-21 ENCOUNTER — Ambulatory Visit (INDEPENDENT_AMBULATORY_CARE_PROVIDER_SITE_OTHER): Payer: Medicare Other | Admitting: Nurse Practitioner

## 2021-02-21 ENCOUNTER — Encounter: Payer: Self-pay | Admitting: Nurse Practitioner

## 2021-02-21 ENCOUNTER — Other Ambulatory Visit: Payer: Self-pay

## 2021-02-21 VITALS — BP 118/70 | HR 75 | Temp 98.1°F | Resp 16 | Ht 72.0 in | Wt 252.0 lb

## 2021-02-21 DIAGNOSIS — R051 Acute cough: Secondary | ICD-10-CM | POA: Diagnosis not present

## 2021-02-21 DIAGNOSIS — H6123 Impacted cerumen, bilateral: Secondary | ICD-10-CM

## 2021-02-21 DIAGNOSIS — R042 Hemoptysis: Secondary | ICD-10-CM

## 2021-02-21 MED ORDER — BENZONATATE 200 MG PO CAPS: 200.0000 mg | ORAL_CAPSULE | Freq: Two times a day (BID) | ORAL | 0 refills | Status: DC | PRN

## 2021-02-21 NOTE — Progress Notes (Signed)
[facility] [place], [place] [address]  Internal MEDICINE  Office Visit Note  Patient Name: [name]  [id number]  [id number]  Date of Service: 02/21/2021  Chief Complaint  Patient presents with   Acute Visit   Cough    Coughing up some blood in mucus    Sore Throat    HPI [name] presents for an acute sick visit for a cough with possible blood and mucus.  He reports having a cough with a sore throat for the last couple of weeks and when he coughed up some mucus there were some red flecks in it.  He denies any fever, shortness of breath, chest tightness, wheezing, fatigue, chills, body aches.  He reports mild nasal congestion and drainage down his throat.  He reports that his throat is sore from the coughing. He is also having decreased hearing.  He has had to have his ears cleaned out due to wax buildup in the past.   Current Medication: Outpatient Encounter Medications as of 02/21/2021  Medication Sig Note   albuterol (VENTOLIN HFA) 108 (90 Base) MCG/ACT inhaler Inhale 2 puffs into the lungs every 4 (four) hours as needed for wheezing or shortness of breath.    amLODipine (NORVASC) 2.5 MG tablet TAKE 1 TABLET(2.5 MG) BY MOUTH DAILY    apixaban (ELIQUIS) 5 MG TABS tablet Take 1 tablet (5 mg total) by mouth 2 (two) times daily.    aspirin EC 81 MG EC tablet Take 1 tablet (81 mg total) by mouth daily. Swallow whole.    atorvastatin (LIPITOR) 10 MG tablet Take 1 tablet (10 mg total) by mouth daily. 12/25/2020: LF 10.27.22   benzonatate (TESSALON) 200 MG capsule Take 1 capsule (200 mg total) by mouth 2 (two) times daily as needed for cough.    FARXIGA 10 MG TABS tablet TAKE 1 TABLET(10 MG) BY MOUTH DAILY BEFORE AND BREAKFAST    ferrous sulfate 325 (65 FE) MG tablet Take 325 mg by mouth daily.  12/25/2020: LF 10.27.22   glucose blood (ONETOUCH VERIO) test strip USE ONCE DAILY. DX E11.65.    insulin glargine, 2 Unit Dial, (TOUJEO MAX SOLOSTAR) 300 UNIT/ML  Solostar Pen Inject 10 Units into the skin daily.    Insulin Pen Needle (PEN NEEDLES 3/16") 31G X 5 MM MISC To use daily with basal insulin injections    metoprolol tartrate (LOPRESSOR) 25 MG tablet Take 1 tablet (25 mg total) by mouth 2 (two) times daily. 12/25/2020: LF 11.19.22   pantoprazole (PROTONIX) 40 MG tablet TAKE 1 TABLET(40 MG) BY MOUTH DAILY    [START ON 03/16/2021] Semaglutide (RYBELSUS) 3 MG TABS Take 3 mg by mouth daily before breakfast.    Tiotropium Bromide Monohydrate (SPIRIVA RESPIMAT) 2.5 MCG/ACT AERS Inhale 2 puffs into the lungs daily. 12/25/2020: LF 12.2.22   vitamin B-12 (CYANOCOBALAMIN) 1000 MCG tablet Take 1 tablet (1,000 mcg total) by mouth daily. 12/25/2020: Not on pt med list from pharmacy   No facility-administered encounter medications on file as of 02/21/2021.    Surgical History: Past Surgical History:  Procedure Laterality Date   CATARACT EXTRACTION     COLONOSCOPY WITH PROPOFOL N/A 07/02/2019   Procedure: COLONOSCOPY WITH PROPOFOL;  Surgeon: Lin Landsman, MD;  Location: [facility] ENDOSCOPY;  Service: Gastroenterology;  Laterality: N/A;   IR CT HEAD LTD  08/27/2019   IR PERCUTANEOUS ART THROMBECTOMY/INFUSION INTRACRANIAL INC DIAG ANGIO  08/27/2019       IR PERCUTANEOUS ART THROMBECTOMY/INFUSION INTRACRANIAL INC DIAG ANGIO  08/27/2019   LOWER EXTREMITY ANGIOGRAPHY Left 12/25/2020   Procedure: Lower Extremity Angiography;  Surgeon: Algernon Huxley, MD;  Location: [place] CV LAB;  Service: Cardiovascular;  Laterality: Left;   PACEMAKER LEADLESS INSERTION N/A 12/19/2020   Procedure: PACEMAKER LEADLESS INSERTION;  Surgeon: Isaias Cowman, MD;  Location: [place] CV LAB;  Service: Cardiovascular;  Laterality: N/A;   PROSTATE CANCER     PROSTATE SURGERY     RADIOLOGY WITH ANESTHESIA N/A 08/27/2019   Procedure: IR WITH ANESTHESIA;  Surgeon: Luanne Bras, MD;  Location: [place];  Service: Radiology;  Laterality: N/A;    Medical History: Past Medical  History:  Diagnosis Date   Atrial fibrillation (HCC)    CHF (congestive heart failure) (HCC)    Diabetes ([place])    Hearing loss    High blood pressure    History of bladder problems    Kidney stones 11/22/2014   Prostate cancer ([place])    Prostate cancer ([place])    Stroke ([place])     Family History: Family History  Problem Relation Age of Onset   Colon cancer Mother    Diabetes Other    High blood pressure Other    Prostate cancer Brother    Diabetes Brother     Social History   Socioeconomic History   Marital status: Married    Spouse name: [name]   Number of children: 2   Years of education: 12   Highest education level: 12th grade  Occupational History   Occupation: [employer]  Tobacco Use   Smoking status: Former    Years: 16.00    Types: Cigarettes   Smokeless tobacco: Never  Vaping Use   Vaping Use: Never used  Substance and Sexual Activity   Alcohol use: No   Drug use: No   Sexual activity: Not Currently  Other Topics Concern   Not on file  Social History Narrative   Not on file   Social Determinants of Health   Financial Resource Strain: Not on file  Food Insecurity: Not on file  Transportation Needs: Not on file  Physical Activity: Not on file  Stress: Not on file  Social Connections: Not on file  Intimate Partner Violence: Not on file      Review of Systems  Constitutional:  Negative for chills, fatigue and fever.  HENT:  Positive for congestion, hearing loss, postnasal drip and sore throat. Negative for ear discharge, rhinorrhea, sinus pressure, sinus pain and sneezing.   Respiratory:  Positive for cough. Negative for chest tightness, shortness of breath and wheezing.   Cardiovascular: Negative.  Negative for chest pain and palpitations.  Gastrointestinal:  Negative for abdominal pain, constipation, diarrhea, nausea and vomiting.  Musculoskeletal:  Negative for myalgias.  Skin:  Negative for rash.  Neurological:  Negative for headaches.    Vital Signs: BP 118/70    Pulse 75    Temp 98.1 F (36.7 C)    Resp 16    Ht 6' (1.829 m)    Wt 252 lb (114.3 kg)    SpO2 98%    BMI 34.18 kg/m    Physical Exam Vitals reviewed.  Constitutional:      General: He is not in acute distress.    Appearance: Normal appearance. He is obese. He is not ill-appearing.  HENT:     Head: Normocephalic and atraumatic.     Right Ear: External ear normal. There is impacted cerumen.     Left Ear: External ear normal. There is  impacted cerumen.     Nose: Congestion present. No rhinorrhea.     Mouth/Throat:     Mouth: Mucous membranes are moist.     Pharynx: Posterior oropharyngeal erythema present.  Eyes:     Pupils: Pupils are equal, round, and reactive to light.  Cardiovascular:     Rate and Rhythm: Normal rate and regular rhythm.     Heart sounds: Normal heart sounds. No murmur heard. Pulmonary:     Effort: Pulmonary effort is normal. No respiratory distress.     Breath sounds: Normal breath sounds. No wheezing.  Lymphadenopathy:     Cervical: No cervical adenopathy.  Neurological:     Mental Status: He is alert and oriented to person, place, and time.  Psychiatric:        Mood and Affect: Mood normal.        Behavior: Behavior normal.       Assessment/Plan: 1. Cough with hemoptysis The red flakes of the patient coughed up are most likely due to superficial irritation of the throat/airway but chest x-ray is ordered to rule out other more complex etiologies. - DG Chest 2 View; Future  2. Acute cough Medication prescribed for symptomatic relief of cough - benzonatate (TESSALON) 200 MG capsule; Take 1 capsule (200 mg total) by mouth 2 (two) times daily as needed for cough.  Dispense: 30 capsule; Refill: 0  3. Bilateral hearing loss due to cerumen impaction Ear lavage procedure done in office to resolve cerumen impaction.  Procedure was successful, patient's hearing improved.  Ears reassessed and the tympanic membrane is normal  bilaterally. - Ear Lavage   General Counseling: joren rehm understanding of the findings of todays visit and agrees with plan of treatment. I have discussed any further diagnostic evaluation that may be needed or ordered today. We also reviewed his medications today. he has been encouraged to call the office with any questions or concerns that should arise related to todays visit.    Orders Placed This Encounter  Procedures   DG Chest 2 View   Ear Lavage    Meds ordered this encounter  Medications   benzonatate (TESSALON) 200 MG capsule    Sig: Take 1 capsule (200 mg total) by mouth 2 (two) times daily as needed for cough.    Dispense:  30 capsule    Refill:  0    Return if symptoms worsen or fail to improve.   Total time spent:30 Minutes Time spent includes review of chart, medications, test results, and follow up plan with the patient.   [place] Controlled Substance Database was reviewed by me.  This patient was seen by Jonetta Osgood, FNP-C in collaboration with Dr. Clayborn Bigness as a part of collaborative care agreement.   Lyndon Chenoweth R. Valetta Fuller, MSN, FNP-C Internal medicine

## 2021-02-23 ENCOUNTER — Telehealth: Payer: Self-pay | Admitting: Nurse Practitioner

## 2021-02-23 ENCOUNTER — Ambulatory Visit
Admission: RE | Admit: 2021-02-23 | Discharge: 2021-02-23 | Disposition: A | Discharge status: Home / Self Care | Payer: Medicare Other | Source: Ambulatory Visit | Attending: Nurse Practitioner | Admitting: Nurse Practitioner

## 2021-02-23 ENCOUNTER — Ambulatory Visit
Admission: RE | Admit: 2021-02-23 | Discharge: 2021-02-23 | Disposition: A | Discharge status: Home / Self Care | Payer: Medicare Other | Attending: Nurse Practitioner | Admitting: Nurse Practitioner

## 2021-02-23 DIAGNOSIS — R042 Hemoptysis: Secondary | ICD-10-CM | POA: Diagnosis not present

## 2021-02-23 DIAGNOSIS — I517 Cardiomegaly: Secondary | ICD-10-CM | POA: Diagnosis not present

## 2021-02-23 NOTE — Chronic Care Management (AMB) (Signed)
°  Chronic Care Management   Outreach Note  02/23/2021 Name: [name] MRN: [id number] DOB: 23-Oct-1933  Referred by: Jonetta Osgood, NP Reason for referral : No chief complaint on file.   An unsuccessful telephone outreach was attempted today. The patient was referred to the pharmacist for assistance with care management and care coordination.   Follow Up Plan:   Tatjana Dellinger Upstream Scheduler

## 2021-02-26 ENCOUNTER — Telehealth: Payer: Self-pay

## 2021-02-26 NOTE — Progress Notes (Signed)
Normal chest xray, no signs of pneumonia noted. No abnormalities noted that could cause him to cough up blood tinged mucous. It is most likely due to irritation of the throat. Please call patient and let him know the results. Please ask him if the coughing has improved.

## 2021-02-26 NOTE — Telephone Encounter (Signed)
-----   Message from Jonetta Osgood, NP sent at 02/26/2021  5:44 AM EST ----- Normal chest xray, no signs of pneumonia noted. No abnormalities noted that could cause him to cough up blood tinged mucous. It is most likely due to irritation of the throat. Please call patient and let him know the results. Please ask him if the coughing has improved.

## 2021-02-28 ENCOUNTER — Other Ambulatory Visit: Payer: Self-pay | Admitting: *Deleted

## 2021-02-28 NOTE — Patient Instructions (Signed)
Visit Information  Thank you for taking time to visit with me today. Please don't hesitate to contact me if I can be of assistance to you before our next scheduled telephone appointment.  Following are the goals we discussed today:  Adhere to diabetic diet.  Decrease bread and sugary intake.  Our next appointment is by telephone in 1 month  The patient verbalized understanding of instructions, educational materials, and care plan provided today and agreed to receive a mailed copy of patient instructions, educational materials, and care plan.   The patient has been provided with contact information for the care management team and has been advised to call with any health related questions or concerns.   [name], RN, MSN, [place] Manager [phone]

## 2021-02-28 NOTE — Patient Outreach (Signed)
[facility]) Care Management Telephonic RN Care Manager Note   02/28/2021 Name:  [name] MRN:  [id number] DOB:  1933-06-23  Summary: [name] call placed to member, successful to wife.  Denies any urgent concerns, encouraged to contact this care manager with questions.    Recommendations/Changes made from today's visit: Follow diabetic diet as instructed.  Subjective: [name] is an 88 y.o. year old male who is a primary patient of Glenn Osgood, NP. The care management team was consulted for assistance with care management and/or care coordination needs.    Telephonic RN Care Manager completed Telephone Visit today.  Objective:   Medications Reviewed Today     Reviewed by Jimmye Norman, CMA (Certified Medical Assistant) on 02/21/21 at 1445  Med List Status: <None>   Medication Order Taking? Sig Documenting Provider Last Dose Status Informant  albuterol (VENTOLIN HFA) 108 (90 Base) MCG/ACT inhaler [id number]  Inhale 2 puffs into the lungs every 4 (four) hours as needed for wheezing or shortness of breath. Luiz Ochoa, NP  Active Pharmacy Records  amLODipine [facility]) 2.5 MG tablet [id number]  TAKE 1 TABLET(2.5 MG) BY MOUTH DAILY McDonough, Lauren K, PA-C  Active   apixaban (ELIQUIS) 5 MG TABS tablet [id number]  Take 1 tablet (5 mg total) by mouth 2 (two) times daily. Jennye Boroughs, MD  Active   aspirin EC 81 MG EC tablet [id number]  Take 1 tablet (81 mg total) by mouth daily. Swallow whole. Jennye Boroughs, MD  Active   atorvastatin (LIPITOR) 10 MG tablet [id number]  Take 1 tablet (10 mg total) by mouth daily. Glenn Osgood, NP  Active Pharmacy Records           Med Note Debbe Bales Dec 25, 2020  8:13 PM) LF 10.27.22  FARXIGA 10 MG TABS tablet [id number]  TAKE 1 TABLET(10 MG) BY MOUTH DAILY BEFORE AND BREAKFAST McDonough, Lauren K, PA-C  Active   ferrous sulfate 325 (65 FE) MG tablet [id number]  Take 325 mg by mouth daily.   [provider]  Active Pharmacy Records           Med Note Debbe Bales Dec 25, 2020  8:14 PM) LF 10.27.22  glucose blood [facility] VERIO) test strip [id number]  USE ONCE DAILY. DX E11.65. Glenn Osgood, NP  Active Pharmacy Records  insulin glargine, 2 Unit Dial, (TOUJEO MAX SOLOSTAR) 300 UNIT/ML Solostar Pen [id number]  Inject 10 Units into the skin daily. Glenn Osgood, NP  Active   Insulin Pen Needle (PEN NEEDLES 3/16") 31G X 5 MM MISC [id number]  To use daily with basal insulin injections Glenn Osgood, NP  Active Pharmacy Records  metoprolol tartrate (LOPRESSOR) 25 MG tablet [id number]  Take 1 tablet (25 mg total) by mouth 2 (two) times daily. Glenn Osgood, NP  Active Pharmacy Records           Med Note Debbe Bales Dec 25, 2020  8:18 PM) LF 11.19.22  pantoprazole (PROTONIX) 40 MG tablet [id number]  TAKE 1 TABLET(40 MG) BY MOUTH DAILY Abernathy, Alyssa, NP  Active   Semaglutide (RYBELSUS) 3 MG TABS [id number]  Take 3 mg by mouth daily before breakfast. Glenn Osgood, NP  Active   Tiotropium Bromide Monohydrate (SPIRIVA RESPIMAT) 2.5 MCG/ACT AERS [id number]  Inhale 2 puffs into the lungs daily. Lavera Guise, MD  Active Pharmacy Records           Med Note [place],  Brunetta Genera Dec 25, 2020  8:20 PM) LF 12.2.22  vitamin B-12 (CYANOCOBALAMIN) 1000 MCG tablet [id number]  Take 1 tablet (1,000 mcg total) by mouth daily. Glenn Osgood, NP  Active Pharmacy Records           Med Note Debbe Bales Dec 25, 2020  8:21 PM) Not on pt med list from pharmacy             SDOH:  (Social Determinants of Health) assessments and interventions performed:     Care Plan  Review of patient past medical history, allergies, medications, health status, including review of consultants reports, laboratory and other test data, was performed as part of comprehensive evaluation for care management services.   Care Plan : General Plan of Care  (Adult)  Updates made by [name], RN since 02/28/2021 12:00 AM     Problem: Knowledge deficit regarding chronic disease management (DM, A-fib, CHF and prostate cancer) as evidenced by elevated A1C and PSA   Priority: High     Long-Range Goal: Member will show management of DM and prostate cancer evidenced by decreaesed A1C and PSA over the next 6 months   Start Date: 11/03/2020  Expected End Date: 05/02/2021  This Visit's Progress: On track  Recent Progress: On track  Priority: High  Note:   Current Barriers:  Knowledge Deficits related to plan of care for management of Atrial Fibrillation, CHF, DMII, and prostate cancer  Chronic Disease Management support and education needs related to Atrial Fibrillation, CHF, DMII, and prostate cancer  RNCM Clinical Goal(s):  Patient will verbalize understanding of plan for management of Atrial Fibrillation, CHF, DMII, and Gout as evidenced by member/wife reporting stability in health take all medications exactly as prescribed and will call provider for medication related questions as evidenced by reported adherence by wife/member    demonstrate understanding of rationale for each prescribed medication as evidenced by for cancer, CHF, A-fib and DM treatment.    attend all scheduled medical appointments: PCP, ONcology, and neurology as evidenced by PCP, ONcology, cardiology, and neurology        continue to work with [place] and/or Social Worker to address care management and care coordination needs related to Atrial Fibrillation, CHF, DMII, and Gout as evidenced by adherence to CM Team Scheduled appointments     through collaboration with RN Care manager, provider, and care team.   Interventions: Inter-disciplinary care team collaboration (see longitudinal plan of care) Evaluation of current treatment plan related to  self management and patient's adherence to plan as established by provider   Diabetes:  (Status: Goal on track: NO.) Lab  Results  Component Value Date   HGBA1C 8.4 (A) 08/18/2020  Assessed patient's understanding of A1c goal: <7% Provided education to patient about basic DM disease process; Reviewed medications with patient and discussed importance of medication adherence;        Reviewed prescribed diet with patient low carb/sugar; Counseled on importance of regular laboratory monitoring as prescribed;        Advised patient, providing education and rationale, to check cbg daily and record         Oncology:  (Status: Goal on Track (progressing): YES.) Assessment of understanding of oncology diagnosis:  Assessed patient understanding of cancer diagnosis and recommended treatment plan, Assessed available transportation to appointments and treatments. Has consistent/reliable transportation: Yes, and Assessed support system. Has consistent/reliable family or other support: Yes  Heart Failure Interventions: Basic overview and  discussion of pathophysiology of Heart Failure reviewed; Provided education on low sodium diet; Discussed importance of daily weight and advised patient to weigh and record daily; Reviewed role of diuretics in prevention of fluid overload and management of heart failure; Discussed the importance of keeping all appointments with provider;  AFIB Interventions:    Counseled on increased risk of stroke due to Afib and benefits of anticoagulation for stroke prevention; Reviewed importance of adherence to anticoagulant exactly as prescribed; Counseled on seeking medical attention after a head injury or if there is blood in the urine/stool; Afib action plan reviewed;   Update 11/11 - Per wife, member has been doing well.  State he has continued to have intermittent chest discomfort as well as shortness of breath.  Was seen by cardiology yesterday, plan for pacemake placement on 11/29.  She is unclear about reason for device, educated on member's history and the goal of management of A-fib/CHF  after intervention.  She verbalizes understanding.  She does not drive often, encouraged to call son and daughter in law to secure transportation to hospital.  Blood pressure today was 129/85, HR 73, blood sugar 140.  She is unable to find his weight for today, he is currently not available.  Noted that neurology appointment for 11/3 was canceled, will assist with rescheduling after discharge home following pacer placement.   Patient Goals/Self-Care Activities: Patient will self administer medications as prescribed as evidenced by self report/primary caregiver report  Patient will attend all scheduled provider appointments as evidenced by clinician review of documented attendance to scheduled appointments and patient/caregiver report Patient will call pharmacy for medication refills as evidenced by patient report and review of pharmacy fill history as appropriate Patient will continue to perform ADL's independently as evidenced by patient/caregiver report   Update 12/8 - Noted that member was hospitalized twice in the last month, one for planned pacemaker implantation and for limb ischemia requiring vascular surgery.  Spoke with member and wife, they both report member is doing well since surgery.  Conference call placed vascular provider to request follow up appointment, they will call member/wife directly to schedule.  He already has appointment scheduled with PCP on 12/14 for labs and hospital follow up as well as tests scheduled on 12/16 for cancer scans.  Wife repeatedly state she is overwhelmed with managing member's care however is reluctant to ask son and daughter in law for help.  Encouraged to contact them and make them aware of upcoming appointments, advised to provide this care manager's contact information for discussion as well.   Update 12/14 - Wife report member is out driving today, went to MD office for labs, will return home to pick her up prior to PCP appointment.  She state he has  been doing well, has not had any further pain.  Conference calls placed to Vascular and neurology office, appointments made for 1/6 at 1pm (vascular) and 1/19 at 145pm (neurology).  Reminded of CT scans scheduled for Friday, 12/16.   Update 1/11 - Per wife, member doing better.  He is still going to work for a few hours several days a week.  Was seen by cardiology on 12/19, cardiac rehab was discussed.  Wife is unaware of this recommendation, state he has another appointment with their office today or tomorrow.  PCP appointment completed on 12/14, insulin dose was increased due to elevated blood sugars, per wife numbers are better, unable to find notebook with all readings but state it was 150 yesterday.  He  will follo wup with PCP again on 1/27.  He attended appointment with oncology on 12/23, notified that PSA has increased again to 60, no signs of metastasis to bone.  Member does not wish to have any further treatment.  Follow up from vascular surgery completed on 1/6, no issues noted.  Reminded of upcoming appointment with neurology on 1/19, she will accompany him to this appointment.  Encouraged again to have son and daughter in law more involved in care, she state they have been helping more with management of both of their conditions.    Update 2/8 - Member not available, current at work.  Was able to speak to wife.  Was seen by PCP a couple weeks ago, A1C remains elevated at 8.3.  Toujeo increased to 16 units a day and Rybelsus added to medication regime.  She report member's blood sugars have ranged from 140-190, will follow up with PCP on 2/24.  Member is not consistent with maintaining his diabetic diet, wife educated on proper foods to eat, will send education.  Blood pressure and heart rate have remained stable, yesterday was 122/84 and 81.  Neurology appointment scheduled for 1/19 was canceled, member/wife will call to reschedule.          Plan:  Telephone follow up appointment with care  management team member scheduled for:  1 month The patient has been provided with contact information for the care management team and has been advised to call with any health related questions or concerns.   [name], RN, MSN, [place] Manager [phone]

## 2021-03-02 ENCOUNTER — Ambulatory Visit: Payer: Medicare Other | Admitting: Nurse Practitioner

## 2021-03-15 ENCOUNTER — Encounter: Payer: Self-pay | Admitting: Nurse Practitioner

## 2021-03-16 ENCOUNTER — Other Ambulatory Visit: Payer: Self-pay

## 2021-03-16 ENCOUNTER — Ambulatory Visit (INDEPENDENT_AMBULATORY_CARE_PROVIDER_SITE_OTHER): Payer: Medicare Other | Admitting: Nurse Practitioner

## 2021-03-16 ENCOUNTER — Encounter: Payer: Self-pay | Admitting: Nurse Practitioner

## 2021-03-16 VITALS — BP 116/76 | HR 70 | Temp 98.0°F | Resp 16 | Ht 72.0 in | Wt 247.2 lb

## 2021-03-16 DIAGNOSIS — R051 Acute cough: Secondary | ICD-10-CM | POA: Diagnosis not present

## 2021-03-16 DIAGNOSIS — I10 Essential (primary) hypertension: Secondary | ICD-10-CM | POA: Diagnosis not present

## 2021-03-16 DIAGNOSIS — J449 Chronic obstructive pulmonary disease, unspecified: Secondary | ICD-10-CM | POA: Diagnosis not present

## 2021-03-16 DIAGNOSIS — E1122 Type 2 diabetes mellitus with diabetic chronic kidney disease: Secondary | ICD-10-CM

## 2021-03-16 DIAGNOSIS — N1832 Chronic kidney disease, stage 3b: Secondary | ICD-10-CM | POA: Diagnosis not present

## 2021-03-16 DIAGNOSIS — Z794 Long term (current) use of insulin: Secondary | ICD-10-CM

## 2021-03-16 MED ORDER — ALBUTEROL SULFATE HFA 108 (90 BASE) MCG/ACT IN AERS: 2.0000 | INHALATION_SPRAY | RESPIRATORY_TRACT | 1 refills | Status: DC | PRN

## 2021-03-16 MED ORDER — SPIRIVA RESPIMAT 2.5 MCG/ACT IN AERS: 2.0000 | INHALATION_SPRAY | Freq: Every day | RESPIRATORY_TRACT | 3 refills | Status: DC

## 2021-03-16 NOTE — Progress Notes (Signed)
[facility] [place], [place] [address]  Internal MEDICINE  Office Visit Note  Patient Name: [name]  [id number]  [id number]  Date of Service: 03/16/2021  Chief Complaint  Patient presents with   Follow-up   Diabetes   Hypertension    HPI [name] presents for a follow-up visit for diabetes, hypertension and cough.  At his previous office visit he had a cough and was coughing up sputum with red flecks in it and was concerned for hemoptysis.  He was sent home with a prescription for [facility] and an order for a chest x-ray to rule out respiratory causes for cough and hemoptysis.  The chest x-ray was normal and his cough has improved.  He also had his ears lavaged at his last office visit and he reports that his hearing is a little bit better but he feels like he needs a hearing aid in the left ear.  Patient plans to call his insurance to find out about coverage for hearing aids and will let me know if he needs a referral to a specialist. His blood pressure is well controlled with current meds patient.  He reports that his blood sugars have been good at home.  He continues to use his Toujeo basal insulin 10 units daily Rybelsus 3 mg daily, and Farxiga 10 mg daily.  At his A1c checked on January 27 and it was 8.3 which was no change from the previous A1c.  He is due in approximately 2 months to have his A1c rechecked.    Current Medication: Outpatient Encounter Medications as of 03/16/2021  Medication Sig Note   amLODipine (NORVASC) 2.5 MG tablet TAKE 1 TABLET(2.5 MG) BY MOUTH DAILY    apixaban (ELIQUIS) 5 MG TABS tablet Take 1 tablet (5 mg total) by mouth 2 (two) times daily.    aspirin EC 81 MG EC tablet Take 1 tablet (81 mg total) by mouth daily. Swallow whole.    atorvastatin (LIPITOR) 10 MG tablet Take 1 tablet (10 mg total) by mouth daily. 12/25/2020: LF 10.27.22   benzonatate (TESSALON) 200 MG capsule Take 1 capsule (200 mg total) by mouth 2 (two)  times daily as needed for cough.    FARXIGA 10 MG TABS tablet TAKE 1 TABLET(10 MG) BY MOUTH DAILY BEFORE AND BREAKFAST    ferrous sulfate 325 (65 FE) MG tablet Take 325 mg by mouth daily.  12/25/2020: LF 10.27.22   glucose blood (ONETOUCH VERIO) test strip USE ONCE DAILY. DX E11.65.    insulin glargine, 2 Unit Dial, (TOUJEO MAX SOLOSTAR) 300 UNIT/ML Solostar Pen Inject 10 Units into the skin daily.    Insulin Pen Needle (PEN NEEDLES 3/16") 31G X 5 MM MISC To use daily with basal insulin injections    metoprolol tartrate (LOPRESSOR) 25 MG tablet Take 1 tablet (25 mg total) by mouth 2 (two) times daily. 12/25/2020: LF 11.19.22   pantoprazole (PROTONIX) 40 MG tablet TAKE 1 TABLET(40 MG) BY MOUTH DAILY    Semaglutide (RYBELSUS) 3 MG TABS Take 3 mg by mouth daily before breakfast.    vitamin B-12 (CYANOCOBALAMIN) 1000 MCG tablet Take 1 tablet (1,000 mcg total) by mouth daily. 12/25/2020: Not on pt med list from pharmacy   [DISCONTINUED] albuterol (VENTOLIN HFA) 108 (90 Base) MCG/ACT inhaler Inhale 2 puffs into the lungs every 4 (four) hours as needed for wheezing or shortness of breath.    [DISCONTINUED] Tiotropium Bromide Monohydrate (SPIRIVA RESPIMAT) 2.5 MCG/ACT AERS Inhale 2 puffs into the  lungs daily. 12/25/2020: LF 12.2.22   albuterol (VENTOLIN HFA) 108 (90 Base) MCG/ACT inhaler Inhale 2 puffs into the lungs every 4 (four) hours as needed for wheezing or shortness of breath.    Tiotropium Bromide Monohydrate (SPIRIVA RESPIMAT) 2.5 MCG/ACT AERS Inhale 2 puffs into the lungs daily.    No facility-administered encounter medications on file as of 03/16/2021.    Surgical History: Past Surgical History:  Procedure Laterality Date   CATARACT EXTRACTION     COLONOSCOPY WITH PROPOFOL N/A 07/02/2019   Procedure: COLONOSCOPY WITH PROPOFOL;  Surgeon: Lin Landsman, MD;  Location[facility] ENDOSCOPY;  Service: Gastroenterology;  Laterality: N/A;   IR CT HEAD LTD  08/27/2019   IR PERCUTANEOUS ART  THROMBECTOMY/INFUSION INTRACRANIAL INC DIAG ANGIO  08/27/2019       IR PERCUTANEOUS ART THROMBECTOMY/INFUSION INTRACRANIAL INC DIAG ANGIO  08/27/2019   LOWER EXTREMITY ANGIOGRAPHY Left 12/25/2020   Procedure: Lower Extremity Angiography;  Surgeon: Algernon Huxley, MD;  Location: [place] CV LAB;  Service: Cardiovascular;  Laterality: Left;   PACEMAKER LEADLESS INSERTION N/A 12/19/2020   Procedure: PACEMAKER LEADLESS INSERTION;  Surgeon: Isaias Cowman, MD;  Location: [place] CV LAB;  Service: Cardiovascular;  Laterality: N/A;   PROSTATE CANCER     PROSTATE SURGERY     RADIOLOGY WITH ANESTHESIA N/A 08/27/2019   Procedure: IR WITH ANESTHESIA;  Surgeon: Luanne Bras, MD;  Location: [place];  Service: Radiology;  Laterality: N/A;    Medical History: Past Medical History:  Diagnosis Date   Atrial fibrillation (HCC)    CHF (congestive heart failure) (HCC)    Diabetes ([place])    Hearing loss    High blood pressure    History of bladder problems    Kidney stones 11/22/2014   Prostate cancer ([place])    Prostate cancer ([place])    Stroke ([place])     Family History: Family History  Problem Relation Age of Onset   Colon cancer Mother    Diabetes Other    High blood pressure Other    Prostate cancer Brother    Diabetes Brother     Social History   Socioeconomic History   Marital status: Married    Spouse name: [name]   Number of children: 2   Years of education: 12   Highest education level: 12th grade  Occupational History   Occupation: [employer]  Tobacco Use   Smoking status: Former    Years: 16.00    Types: Cigarettes   Smokeless tobacco: Never  Vaping Use   Vaping Use: Never used  Substance and Sexual Activity   Alcohol use: No   Drug use: No   Sexual activity: Not Currently  Other Topics Concern   Not on file  Social History Narrative   Not on file   Social Determinants of Health   Financial Resource Strain: Not on file  Food Insecurity: Not on file   Transportation Needs: Not on file  Physical Activity: Not on file  Stress: Not on file  Social Connections: Not on file  Intimate Partner Violence: Not on file      Review of Systems  Constitutional: Negative.  Negative for chills, fatigue and unexpected weight change.  HENT: Negative.  Negative for congestion, rhinorrhea, sneezing and sore throat.   Eyes:  Negative for redness.  Respiratory:  Positive for cough (improved some). Negative for chest tightness, shortness of breath and wheezing.   Cardiovascular: Negative.  Negative for chest pain and palpitations.  Gastrointestinal: Negative.  Negative for abdominal pain, constipation, diarrhea, nausea and vomiting.  Genitourinary:  Negative for dysuria and frequency.  Musculoskeletal: Negative.  Negative for arthralgias, back pain, joint swelling and neck pain.  Skin:  Negative for rash.  Neurological: Negative.  Negative for tremors and numbness.  Hematological:  Negative for adenopathy. Does not bruise/bleed easily.  Psychiatric/Behavioral:  Negative for behavioral problems (Depression), sleep disturbance and suicidal ideas. The patient is not nervous/anxious.    Vital Signs: BP 116/76    Pulse 70    Temp 98 F (36.7 C)    Resp 16    Ht 6' (1.829 m)    Wt 247 lb 3.2 oz (112.1 kg)    SpO2 97%    BMI 33.53 kg/m    Physical Exam Vitals reviewed.  Constitutional:      General: He is not in acute distress.    Appearance: Normal appearance. He is obese. He is not ill-appearing.  HENT:     Head: Normocephalic and atraumatic.  Eyes:     Pupils: Pupils are equal, round, and reactive to light.  Cardiovascular:     Rate and Rhythm: Normal rate and regular rhythm.  Pulmonary:     Effort: Pulmonary effort is normal. No respiratory distress.  Neurological:     Mental Status: He is alert and oriented to person, place, and time.  Psychiatric:        Mood and Affect: Mood normal.        Behavior: Behavior normal.        Assessment/Plan: 1. Type 2 diabetes mellitus with stage 3b chronic kidney disease, with long-term current use of insulin ([place]) Taking rybelsus and farxiga, continue toujeo as well. Will repeat A1C in 2 months.   2. Essential hypertension Stable, continue medications as prescribed.   3. COPD, mild (HCC) Stable, refills ordered.  - albuterol (VENTOLIN HFA) 108 (90 Base) MCG/ACT inhaler; Inhale 2 puffs into the lungs every 4 (four) hours as needed for wheezing or shortness of breath.  Dispense: 18 g; Refill: 1 - Tiotropium Bromide Monohydrate (SPIRIVA RESPIMAT) 2.5 MCG/ACT AERS; Inhale 2 puffs into the lungs daily.  Dispense: 4 g; Refill: 3  4. Acute cough Improving.    General Counseling: nosson wender understanding of the findings of todays visit and agrees with plan of treatment. I have discussed any further diagnostic evaluation that may be needed or ordered today. We also reviewed his medications today. he has been encouraged to call the office with any questions or concerns that should arise related to todays visit.    No orders of the defined types were placed in this encounter.   Meds ordered this encounter  Medications   albuterol (VENTOLIN HFA) 108 (90 Base) MCG/ACT inhaler    Sig: Inhale 2 puffs into the lungs every 4 (four) hours as needed for wheezing or shortness of breath.    Dispense:  18 g    Refill:  1   Tiotropium Bromide Monohydrate (SPIRIVA RESPIMAT) 2.5 MCG/ACT AERS    Sig: Inhale 2 puffs into the lungs daily.    Dispense:  4 g    Refill:  3    Return in about 2 months (around 05/17/2021) for F/U, Recheck A1C, Kallen Mccrystal PCP.   Total time spent:30 Minutes Time spent includes review of chart, medications, test results, and follow up plan with the patient.   [place] Controlled Substance Database was reviewed by me.  This patient was seen by Jonetta Osgood, FNP-C in collaboration with Dr. Clayborn Bigness  as a part of collaborative care  agreement.   Authur Cubit R. Valetta Fuller, MSN, FNP-C Internal medicine

## 2021-03-20 DIAGNOSIS — G4733 Obstructive sleep apnea (adult) (pediatric): Secondary | ICD-10-CM | POA: Diagnosis not present

## 2021-03-26 ENCOUNTER — Other Ambulatory Visit: Payer: Self-pay | Admitting: *Deleted

## 2021-03-26 NOTE — Patient Outreach (Signed)
[facility]) Care Management Telephonic RN Care Manager Note   03/26/2021 Name:  [name] MRN:  [id number] DOB:  [birth year]  Summary: [name] call placed to member, successful to wife.  Denies any urgent concerns, encouraged to contact this care manager with questions.  Subjective: [name] is an 88 y.o. year old male who is a primary patient of Jonetta Osgood, NP. The care management team was consulted for assistance with care management and/or care coordination needs.    Telephonic RN Care Manager completed Telephone Visit today.  Objective:   Medications Reviewed Today     Reviewed by Jonetta Osgood, NP (Nurse Practitioner) on 03/16/21 at 1523  Med List Status: <None>   Medication Order Taking? Sig Documenting Provider Last Dose Status Informant  albuterol (VENTOLIN HFA) 108 (90 Base) MCG/ACT inhaler [id number]  Inhale 2 puffs into the lungs every 4 (four) hours as needed for wheezing or shortness of breath. Jonetta Osgood, NP  Active   amLODipine (NORVASC) 2.5 MG tablet [id number] Yes TAKE 1 TABLET(2.5 MG) BY MOUTH DAILY McDonough, Lauren K, PA-C Taking Active   apixaban (ELIQUIS) 5 MG TABS tablet [id number] Yes Take 1 tablet (5 mg total) by mouth 2 (two) times daily. Jennye Boroughs, MD Taking Active   aspirin EC 81 MG EC tablet [id number] Yes Take 1 tablet (81 mg total) by mouth daily. Swallow whole. Jennye Boroughs, MD Taking Active   atorvastatin (LIPITOR) 10 MG tablet [id number] Yes Take 1 tablet (10 mg total) by mouth daily. Jonetta Osgood, NP Taking Active Pharmacy Records           Med Note Debbe Bales Dec 25, 2020  8:13 PM) LF 10.27.22  benzonatate (TESSALON) 200 MG capsule [id number] Yes Take 1 capsule (200 mg total) by mouth 2 (two) times daily as needed for cough. Jonetta Osgood, NP Taking Active   FARXIGA 10 MG TABS tablet [id number] Yes TAKE 1 TABLET(10 MG) BY MOUTH DAILY BEFORE AND BREAKFAST McDonough, Lauren K,  PA-C Taking Active   ferrous sulfate 325 (65 FE) MG tablet [id number] Yes Take 325 mg by mouth daily.  [provider] Taking Active Pharmacy Records           Med Note Debbe Bales Dec 25, 2020  8:14 PM) LF 10.27.22  glucose blood [facility] VERIO) test strip [id number] Yes USE ONCE DAILY. DX E11.65. Jonetta Osgood, NP Taking Active Pharmacy Records  insulin glargine, 2 Unit Dial, (TOUJEO MAX SOLOSTAR) 300 UNIT/ML Solostar Pen [id number] Yes Inject 10 Units into the skin daily. Jonetta Osgood, NP Taking Active   Insulin Pen Needle (PEN NEEDLES 3/16") 31G X 5 MM MISC [id number] Yes To use daily with basal insulin injections Jonetta Osgood, NP Taking Active Pharmacy Records  metoprolol tartrate (LOPRESSOR) 25 MG tablet [id number] Yes Take 1 tablet (25 mg total) by mouth 2 (two) times daily. Jonetta Osgood, NP Taking Active Pharmacy Records           Med Note Debbe Bales Dec 25, 2020  8:18 PM) LF 11.19.22  pantoprazole (PROTONIX) 40 MG tablet [id number] Yes TAKE 1 TABLET(40 MG) BY MOUTH DAILY Abernathy, Alyssa, NP Taking Active   Semaglutide (RYBELSUS) 3 MG TABS [id number] Yes Take 3 mg by mouth daily before breakfast. Jonetta Osgood, NP Taking Active   Tiotropium Bromide Monohydrate (SPIRIVA RESPIMAT) 2.5 MCG/ACT AERS [id number]  Inhale 2 puffs into the lungs daily. Jonetta Osgood, NP  Active   vitamin  B-12 (CYANOCOBALAMIN) 1000 MCG tablet [id number] Yes Take 1 tablet (1,000 mcg total) by mouth daily. Jonetta Osgood, NP Taking Active Pharmacy Records           Med Note Debbe Bales Dec 25, 2020  8:21 PM) Not on pt med list from pharmacy             SDOH:  (Social Determinants of Health) assessments and interventions performed:     Care Plan  Review of patient past medical history, allergies, medications, health status, including review of consultants reports, laboratory and other test data, was performed as part of comprehensive  evaluation for care management services.   Care Plan : General Plan of Care (Adult)  Updates made by [name], RN since 03/26/2021 12:00 AM     Problem: Knowledge deficit regarding chronic disease management (DM, A-fib, CHF and prostate cancer) as evidenced by elevated A1C and PSA   Priority: High     Long-Range Goal: Member will show management of DM and prostate cancer evidenced by decreaesed A1C and PSA over the next 6 months   Start Date: 11/03/2020  Expected End Date: 05/02/2021  This Visit's Progress: On track  Recent Progress: On track  Priority: High  Note:   Current Barriers:  Knowledge Deficits related to plan of care for management of Atrial Fibrillation, CHF, DMII, and prostate cancer  Chronic Disease Management support and education needs related to Atrial Fibrillation, CHF, DMII, and prostate cancer  RNCM Clinical Goal(s):  Patient will verbalize understanding of plan for management of Atrial Fibrillation, CHF, DMII, and Gout as evidenced by member/wife reporting stability in health take all medications exactly as prescribed and will call provider for medication related questions as evidenced by reported adherence by wife/member    demonstrate understanding of rationale for each prescribed medication as evidenced by for cancer, CHF, A-fib and DM treatment.    attend all scheduled medical appointments: PCP, ONcology, and neurology as evidenced by PCP, ONcology, cardiology, and neurology        continue to work with [place] and/or Social Worker to address care management and care coordination needs related to Atrial Fibrillation, CHF, DMII, and Gout as evidenced by adherence to CM Team Scheduled appointments     through collaboration with RN Care manager, provider, and care team.   Interventions: Inter-disciplinary care team collaboration (see longitudinal plan of care) Evaluation of current treatment plan related to  self management and patient's adherence to plan  as established by provider   Diabetes:  (Status: Goal on track: NO.) Lab Results  Component Value Date   HGBA1C 8.3 (A) 02/16/2021  Assessed patient's understanding of A1c goal: <7% Provided education to patient about basic DM disease process; Reviewed medications with patient and discussed importance of medication adherence;        Reviewed prescribed diet with patient low carb/sugar; Counseled on importance of regular laboratory monitoring as prescribed;        Advised patient, providing education and rationale, to check cbg daily and record         Oncology:  (Status: Goal on Track (progressing): YES.) Assessment of understanding of oncology diagnosis:  Assessed patient understanding of cancer diagnosis and recommended treatment plan, Assessed available transportation to appointments and treatments. Has consistent/reliable transportation: Yes, and Assessed support system. Has consistent/reliable family or other support: Yes  Heart Failure Interventions: Basic overview and discussion of pathophysiology of Heart Failure reviewed; Provided education on low sodium diet; Discussed importance  of daily weight and advised patient to weigh and record daily; Reviewed role of diuretics in prevention of fluid overload and management of heart failure; Discussed the importance of keeping all appointments with provider;  AFIB Interventions:    Counseled on increased risk of stroke due to Afib and benefits of anticoagulation for stroke prevention; Reviewed importance of adherence to anticoagulant exactly as prescribed; Counseled on seeking medical attention after a head injury or if there is blood in the urine/stool; Afib action plan reviewed;   Update 11/11 - Per wife, member has been doing well.  State he has continued to have intermittent chest discomfort as well as shortness of breath.  Was seen by cardiology yesterday, plan for pacemake placement on 11/29.  She is unclear about reason for  device, educated on member's history and the goal of management of A-fib/CHF after intervention.  She verbalizes understanding.  She does not drive often, encouraged to call son and daughter in law to secure transportation to hospital.  Blood pressure today was 129/85, HR 73, blood sugar 140.  She is unable to find his weight for today, he is currently not available.  Noted that neurology appointment for 11/3 was canceled, will assist with rescheduling after discharge home following pacer placement.   Patient Goals/Self-Care Activities: Patient will self administer medications as prescribed as evidenced by self report/primary caregiver report  Patient will attend all scheduled provider appointments as evidenced by clinician review of documented attendance to scheduled appointments and patient/caregiver report Patient will call pharmacy for medication refills as evidenced by patient report and review of pharmacy fill history as appropriate Patient will continue to perform ADL's independently as evidenced by patient/caregiver report   Update 12/8 - Noted that member was hospitalized twice in the last month, one for planned pacemaker implantation and for limb ischemia requiring vascular surgery.  Spoke with member and wife, they both report member is doing well since surgery.  Conference call placed vascular provider to request follow up appointment, they will call member/wife directly to schedule.  He already has appointment scheduled with PCP on 12/14 for labs and hospital follow up as well as tests scheduled on 12/16 for cancer scans.  Wife repeatedly state she is overwhelmed with managing member's care however is reluctant to ask son and daughter in law for help.  Encouraged to contact them and make them aware of upcoming appointments, advised to provide this care manager's contact information for discussion as well.   Update 12/14 - Wife report member is out driving today, went to MD office for labs,  will return home to pick her up prior to PCP appointment.  She state he has been doing well, has not had any further pain.  Conference calls placed to Vascular and neurology office, appointments made for 1/6 at 1pm (vascular) and 1/19 at 145pm (neurology).  Reminded of CT scans scheduled for Friday, 12/16.   Update 1/11 - Per wife, member doing better.  He is still going to work for a few hours several days a week.  Was seen by cardiology on 12/19, cardiac rehab was discussed.  Wife is unaware of this recommendation, state he has another appointment with their office today or tomorrow.  PCP appointment completed on 12/14, insulin dose was increased due to elevated blood sugars, per wife numbers are better, unable to find notebook with all readings but state it was 150 yesterday.  He will follo wup with PCP again on 1/27.  He attended appointment with oncology on  12/23, notified that PSA has increased again to 60, no signs of metastasis to bone.  Member does not wish to have any further treatment.  Follow up from vascular surgery completed on 1/6, no issues noted.  Reminded of upcoming appointment with neurology on 1/19, she will accompany him to this appointment.  Encouraged again to have son and daughter in law more involved in care, she state they have been helping more with management of both of their conditions.    Update 2/8 - Member not available, current at work.  Was able to speak to wife.  Was seen by PCP a couple weeks ago, A1C remains elevated at 8.3.  Toujeo increased to 16 units a day and Rybelsus added to medication regime.  She report member's blood sugars have ranged from 140-190, will follow up with PCP on 2/24.  Member is not consistent with maintaining his diabetic diet, wife educated on proper foods to eat, will send education.  Blood pressure and heart rate have remained stable, yesterday was 122/84 and 81.  Neurology appointment scheduled for 1/19 was canceled, member/wife will call to  reschedule.   Update 3/6 - Spoke with wife, state member is doing well, he is working today, unable to speak directly to member.  She report his blood sugars have ranged 110-140's at times but can still be elevated some days.  She is unable to find member's log today but will look for it to confirm if values are stable.  Has appointments with oncology on 3/23, vascular on 4/7, podiatry on 4/10, and PCP on 4/21 (will have repeat A1C at that time).  Discussed need to reschedule neurology appointment, she prefer to wait to discuss with member to see if he wants to go.  If he does, she will call this care manager to assist with scheduling.         Plan:  Telephone follow up appointment with care management team member scheduled for:  1 month The patient has been provided with contact information for the care management team and has been advised to call with any health related questions or concerns.   [name], RN, MSN, [place] Manager [phone]

## 2021-04-03 ENCOUNTER — Other Ambulatory Visit: Payer: Self-pay | Admitting: Physician Assistant

## 2021-04-03 ENCOUNTER — Other Ambulatory Visit: Payer: Self-pay | Admitting: Nurse Practitioner

## 2021-04-03 DIAGNOSIS — K219 Gastro-esophageal reflux disease without esophagitis: Secondary | ICD-10-CM

## 2021-04-03 DIAGNOSIS — I10 Essential (primary) hypertension: Secondary | ICD-10-CM

## 2021-04-10 ENCOUNTER — Other Ambulatory Visit: Payer: Self-pay

## 2021-04-10 ENCOUNTER — Inpatient Hospital Stay: Payer: Medicare Other | Attending: Oncology

## 2021-04-10 DIAGNOSIS — C61 Malignant neoplasm of prostate: Secondary | ICD-10-CM | POA: Insufficient documentation

## 2021-04-10 DIAGNOSIS — Z923 Personal history of irradiation: Secondary | ICD-10-CM | POA: Diagnosis not present

## 2021-04-10 DIAGNOSIS — C772 Secondary and unspecified malignant neoplasm of intra-abdominal lymph nodes: Secondary | ICD-10-CM | POA: Diagnosis not present

## 2021-04-10 LAB — CBC WITH DIFFERENTIAL/PLATELET
Abs Immature Granulocytes: 0.01 10*3/uL (ref 0.00–0.07)
Basophils Absolute: 0 10*3/uL (ref 0.0–0.1)
Basophils Relative: 1 %
Eosinophils Absolute: 0.1 10*3/uL (ref 0.0–0.5)
Eosinophils Relative: 2 %
HCT: 47.9 % (ref 39.0–52.0)
Hemoglobin: 15 g/dL (ref 13.0–17.0)
Immature Granulocytes: 0 %
Lymphocytes Relative: 30 %
Lymphs Abs: 2 10*3/uL (ref 0.7–4.0)
MCH: 28.8 pg (ref 26.0–34.0)
MCHC: 31.3 g/dL (ref 30.0–36.0)
MCV: 92.1 fL (ref 80.0–100.0)
Monocytes Absolute: 0.8 10*3/uL (ref 0.1–1.0)
Monocytes Relative: 13 %
Neutro Abs: 3.7 10*3/uL (ref 1.7–7.7)
Neutrophils Relative %: 54 %
Platelets: 184 10*3/uL (ref 150–400)
RBC: 5.2 MIL/uL (ref 4.22–5.81)
RDW: 14.8 % (ref 11.5–15.5)
WBC: 6.7 10*3/uL (ref 4.0–10.5)
nRBC: 0 % (ref 0.0–0.2)

## 2021-04-10 LAB — COMPREHENSIVE METABOLIC PANEL
ALT: 29 U/L (ref 0–44)
AST: 27 U/L (ref 15–41)
Albumin: 3.9 g/dL (ref 3.5–5.0)
Alkaline Phosphatase: 115 U/L (ref 38–126)
Anion gap: 9 (ref 5–15)
BUN: 31 mg/dL — ABNORMAL HIGH (ref 8–23)
CO2: 25 mmol/L (ref 22–32)
Calcium: 9.2 mg/dL (ref 8.9–10.3)
Chloride: 101 mmol/L (ref 98–111)
Creatinine, Ser: 2.13 mg/dL — ABNORMAL HIGH (ref 0.61–1.24)
GFR, Estimated: 30 mL/min — ABNORMAL LOW (ref >60)
Glucose, Bld: 112 mg/dL — ABNORMAL HIGH (ref 70–99)
Potassium: 4.2 mmol/L (ref 3.5–5.1)
Sodium: 135 mmol/L (ref 135–145)
Total Bilirubin: 0.3 mg/dL (ref 0.3–1.2)
Total Protein: 7.9 g/dL (ref 6.5–8.1)

## 2021-04-10 LAB — PSA: Prostatic Specific Antigen: 83.98 ng/mL — ABNORMAL HIGH (ref 0.00–4.00)

## 2021-04-12 ENCOUNTER — Inpatient Hospital Stay (HOSPITAL_BASED_OUTPATIENT_CLINIC_OR_DEPARTMENT_OTHER): Payer: Medicare Other | Admitting: Oncology

## 2021-04-12 ENCOUNTER — Encounter: Payer: Self-pay | Admitting: Oncology

## 2021-04-12 ENCOUNTER — Other Ambulatory Visit: Payer: Self-pay

## 2021-04-12 VITALS — BP 125/73 | HR 96 | Temp 98.7°F | Resp 19 | Wt 248.3 lb

## 2021-04-12 DIAGNOSIS — Z923 Personal history of irradiation: Secondary | ICD-10-CM | POA: Diagnosis not present

## 2021-04-12 DIAGNOSIS — C61 Malignant neoplasm of prostate: Secondary | ICD-10-CM

## 2021-04-12 DIAGNOSIS — R972 Elevated prostate specific antigen [PSA]: Secondary | ICD-10-CM

## 2021-04-12 DIAGNOSIS — R590 Localized enlarged lymph nodes: Secondary | ICD-10-CM | POA: Diagnosis not present

## 2021-04-12 DIAGNOSIS — C772 Secondary and unspecified malignant neoplasm of intra-abdominal lymph nodes: Secondary | ICD-10-CM | POA: Diagnosis not present
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
## 2021-04-16 ENCOUNTER — Other Ambulatory Visit: Payer: Self-pay

## 2021-04-16 MED ORDER — RYBELSUS 7 MG PO TABS: 7.0000 mg | ORAL_TABLET | Freq: Every day | ORAL | 3 refills | Status: DC

## 2021-04-16 NOTE — Telephone Encounter (Signed)
Pt's wife called and advised that pt had a fasting Bs of 180 and was asking about his RYBELSUS medication if he still needs to be taking it.  Per Dr Humphrey Rolls she advised to increase the RYBELSUS to 7 mg daily.  Pt's wife was given instructions on the 3 medications for his diabetes.  Toujeo 10 units, rybelsus 7 mg and farxiga 10 mg ?

## 2021-04-19 DIAGNOSIS — G4733 Obstructive sleep apnea (adult) (pediatric): Secondary | ICD-10-CM | POA: Diagnosis not present

## 2021-04-24 ENCOUNTER — Other Ambulatory Visit: Payer: Self-pay | Admitting: *Deleted

## 2021-04-24 NOTE — Patient Outreach (Signed)
?[facility]) Care Management ?Telephonic RN Care Manager Note ? ? ?04/24/2021 ?Name:  [name] MRN:  [id number] DOB:  05/06/1933 ? ?Summary: ?Outgoing call placed to member, successful to wife.  Denies any urgent concerns, encouraged to contact this care manager with questions.  ? ?Recommendations/Changes made from today's visit: ?Continue daily monitoring of blood pressure, weight, and blood sugar.  Schedule appointment with neurology.   ? ? ?Subjective: ?[name] is an 88 y.o. year old male who is a primary patient of Jonetta Osgood, NP. The care management team was consulted for assistance with care management and/or care coordination needs.   ? ?Telephonic RN Care Manager completed Telephone Visit today. ? ?Objective:  ? ?Medications Reviewed Today   ? ? Reviewed by Maia Breslow, CMA (Certified Medical Assistant) on 04/12/21 at 1123  Med List Status: <None>  ? ?Medication Order Taking? Sig Documenting Provider Last Dose Status Informant  ?albuterol (VENTOLIN HFA) 108 (90 Base) MCG/ACT inhaler [id number] Yes Inhale 2 puffs into the lungs every 4 (four) hours as needed for wheezing or shortness of breath. Jonetta Osgood, NP Taking Active   ?amLODipine (NORVASC) 2.5 MG tablet [id number] Yes TAKE 1 TABLET(2.5 MG) BY MOUTH DAILY Jonetta Osgood, NP Taking Active   ?apixaban (ELIQUIS) 5 MG TABS tablet [id number] Yes Take 1 tablet (5 mg total) by mouth 2 (two) times daily. Jennye Boroughs, MD Taking Active   ?aspirin EC 81 MG EC tablet [id number] Yes Take 1 tablet (81 mg total) by mouth daily. Swallow whole. Jennye Boroughs, MD Taking Active   ?atorvastatin (LIPITOR) 10 MG tablet [id number] Yes Take 1 tablet (10 mg total) by mouth daily. Jonetta Osgood, NP Taking Active Pharmacy Records  ?         ?Med Note Debbe Bales Dec 25, 2020  8:13 PM) LF 10.27.22  ?benzonatate (TESSALON) 200 MG capsule [id number] Yes Take 1 capsule (200 mg total) by mouth 2 (two) times daily  as needed for cough. Jonetta Osgood, NP Taking Active   ?FARXIGA 10 MG TABS tablet [id number] Yes TAKE 1 TABLET(10 MG) BY MOUTH DAILY BEFORE AND BREAKFAST McDonough, Lauren K, PA-C Taking Active   ?ferrous sulfate 325 (65 FE) MG tablet [id number] Yes Take 325 mg by mouth daily.  [provider] Taking Active Pharmacy Records  ?         ?Med Note Debbe Bales Dec 25, 2020  8:14 PM) LF 10.27.22  ?glucose blood (ONETOUCH VERIO) test strip [id number] Yes USE ONCE DAILY. DX E11.65. Jonetta Osgood, NP Taking Active Pharmacy Records  ?insulin glargine, 2 Unit Dial, (TOUJEO MAX SOLOSTAR) 300 UNIT/ML Solostar Pen [id number] Yes Inject 10 Units into the skin daily. Jonetta Osgood, NP Taking Active   ?Insulin Pen Needle (PEN NEEDLES 3/16") 31G X 5 MM MISC [id number] Yes To use daily with basal insulin injections Jonetta Osgood, NP Taking Active Pharmacy Records  ?metoprolol tartrate (LOPRESSOR) 25 MG tablet [id number] Yes Take 1 tablet (25 mg total) by mouth 2 (two) times daily. Jonetta Osgood, NP Taking Active Pharmacy Records  ?         ?Med Note Debbe Bales Dec 25, 2020  8:18 PM) LF 11.19.22  ?pantoprazole (PROTONIX) 40 MG tablet [id number] Yes TAKE 1 TABLET(40 MG) BY MOUTH DAILY Jonetta Osgood, NP Taking Active   ?Semaglutide (RYBELSUS) 3 MG TABS [id number] Yes Take 3 mg by mouth daily before breakfast. Jonetta Osgood, NP Taking Active   ?  Tiotropium Bromide Monohydrate (SPIRIVA RESPIMAT) 2.5 MCG/ACT AERS [id number] Yes Inhale 2 puffs into the lungs daily. Jonetta Osgood, NP Taking Active   ?vitamin B-12 (CYANOCOBALAMIN) 1000 MCG tablet [id number] Yes Take 1 tablet (1,000 mcg total) by mouth daily. Jonetta Osgood, NP Taking Active Pharmacy Records  ?         ?Med Note Debbe Bales Dec 25, 2020  8:21 PM) Not on pt med list from pharmacy  ? ?  ?  ? ?  ? ? ? ?SDOH:  (Social Determinants of Health) assessments and interventions performed:  ? ? ? ?Care Plan ? ?Review  of patient past medical history, allergies, medications, health status, including review of consultants reports, laboratory and other test data, was performed as part of comprehensive evaluation for care management services.  ? ?Care Plan : General Plan of Care (Adult)  ?Updates made by [name], RN since 04/24/2021 12:00 AM  ?  ? ?Problem: Knowledge deficit regarding chronic disease management (DM, A-fib, CHF and prostate cancer) as evidenced by elevated A1C and PSA   ?Priority: High  ?  ? ?Long-Range Goal: Member will show management of DM and prostate cancer evidenced by decreaesed A1C and PSA over the next 6 months   ?Start Date: 11/03/2020  ?Expected End Date: 05/02/2021  ?This Visit's Progress: On track  ?Recent Progress: On track  ?Priority: High  ?Note:   ?Current Barriers:  ?Knowledge Deficits related to plan of care for management of Atrial Fibrillation, CHF, DMII, and prostate cancer  ?Chronic Disease Management support and education needs related to Atrial Fibrillation, CHF, DMII, and prostate cancer ? ?RNCM Clinical Goal(s):  ?Patient will verbalize understanding of plan for management of Atrial Fibrillation, CHF, DMII, and Gout as evidenced by member/wife reporting stability in health ?take all medications exactly as prescribed and will call provider for medication related questions as evidenced by reported adherence by wife/member    ?demonstrate understanding of rationale for each prescribed medication as evidenced by for cancer, CHF, A-fib and DM treatment.    ?attend all scheduled medical appointments: PCP, ONcology, and neurology as evidenced by PCP, ONcology, cardiology, and neurology        ?continue to work with Consulting civil engineer and/or Social Worker to address care management and care coordination needs related to Atrial Fibrillation, CHF, DMII, and Gout as evidenced by adherence to CM Team Scheduled appointments     through collaboration with RN Care manager, provider, and care team.   ? ?Interventions: ?Inter-disciplinary care team collaboration (see longitudinal plan of care) ?Evaluation of current treatment plan related to  self management and patient's adherence to plan as established by provider ? ? ?Diabetes:  (Status: Goal on track: NO.) ?Lab Results  ?Component Value Date  ? HGBA1C 8.3 (A) 02/16/2021  ?Assessed patient's understanding of A1c goal: <7% ?Provided education to patient about basic DM disease process; ?Reviewed medications with patient and discussed importance of medication adherence;        ?Reviewed prescribed diet with patient low carb/sugar; ?Counseled on importance of regular laboratory monitoring as prescribed;        ?Advised patient, providing education and rationale, to check cbg daily and record        ? ?Oncology:  (Status: Goal on Track (progressing): YES.) ?Assessment of understanding of oncology diagnosis:  ?Assessed patient understanding of cancer diagnosis and recommended treatment plan, Assessed available transportation to appointments and treatments. Has consistent/reliable transportation: Yes, and Assessed support system. Has consistent/reliable family or  other support: Yes ? ?Heart Failure Interventions: ?Basic overview and discussion of pathophysiology of Heart Failure reviewed; ?Provided education on low sodium diet; ?Discussed importance of daily weight and advised patient to weigh and record daily; ?Reviewed role of diuretics in prevention of fluid overload and management of heart failure; ?Discussed the importance of keeping all appointments with provider; ? ?AFIB Interventions:  ?  Counseled on increased risk of stroke due to Afib and benefits of anticoagulation for stroke prevention; ?Reviewed importance of adherence to anticoagulant exactly as prescribed; ?Counseled on seeking medical attention after a head injury or if there is blood in the urine/stool; ?Afib action plan reviewed; ? ? ?Update 11/11 - Per wife, member has been doing well.  State he  has continued to have intermittent chest discomfort as well as shortness of breath.  Was seen by cardiology yesterday, plan for pacemake placement on 11/29.  She is unclear about reason for device, educated on

## 2021-04-24 NOTE — Patient Instructions (Signed)
Visit Information ? ?Thank you for taking time to visit with me today. Please don't hesitate to contact me if I can be of assistance to you before our next scheduled telephone appointment. ? ?Following are the goals we discussed today:  ?Continue to follow low salt diet.   ?Strongly consider scheduling follow up appointment with Neurology.  ? ?Our next appointment is by telephone in 1 month ? ?The patient verbalized understanding of instructions, educational materials, and care plan provided today and agreed to receive a mailed copy of patient instructions, educational materials, and care plan.  ? ?The patient has been provided with contact information for the care management team and has been advised to call with any health related questions or concerns.  ? ?[name], RN, MSN, CCM ?[facility] Care Management  ?Community Care Manager ?[phone] ? ? ? ? ?

## 2021-04-27 ENCOUNTER — Ambulatory Visit (INDEPENDENT_AMBULATORY_CARE_PROVIDER_SITE_OTHER): Payer: Medicare Other | Admitting: Nurse Practitioner

## 2021-04-27 ENCOUNTER — Encounter (INDEPENDENT_AMBULATORY_CARE_PROVIDER_SITE_OTHER): Payer: Medicare Other

## 2021-04-30 ENCOUNTER — Ambulatory Visit: Payer: Medicare Other | Admitting: Podiatry

## 2021-04-30 ENCOUNTER — Other Ambulatory Visit: Payer: Self-pay | Admitting: Physician Assistant

## 2021-04-30 DIAGNOSIS — E1122 Type 2 diabetes mellitus with diabetic chronic kidney disease: Secondary | ICD-10-CM

## 2021-05-01 ENCOUNTER — Other Ambulatory Visit (INDEPENDENT_AMBULATORY_CARE_PROVIDER_SITE_OTHER): Payer: Self-pay | Admitting: Nurse Practitioner

## 2021-05-01 DIAGNOSIS — I739 Peripheral vascular disease, unspecified: Secondary | ICD-10-CM

## 2021-05-04 ENCOUNTER — Encounter (INDEPENDENT_AMBULATORY_CARE_PROVIDER_SITE_OTHER): Payer: Self-pay | Admitting: Nurse Practitioner

## 2021-05-04 ENCOUNTER — Ambulatory Visit (INDEPENDENT_AMBULATORY_CARE_PROVIDER_SITE_OTHER): Payer: Medicare Other | Admitting: Nurse Practitioner

## 2021-05-04 ENCOUNTER — Ambulatory Visit (INDEPENDENT_AMBULATORY_CARE_PROVIDER_SITE_OTHER): Payer: Medicare Other

## 2021-05-04 VITALS — BP 105/68 | HR 75 | Resp 18 | Ht 72.0 in | Wt 245.8 lb

## 2021-05-04 DIAGNOSIS — Z794 Long term (current) use of insulin: Secondary | ICD-10-CM | POA: Diagnosis not present

## 2021-05-04 DIAGNOSIS — I5022 Chronic systolic (congestive) heart failure: Secondary | ICD-10-CM | POA: Diagnosis not present

## 2021-05-04 DIAGNOSIS — I739 Peripheral vascular disease, unspecified: Secondary | ICD-10-CM

## 2021-05-04 DIAGNOSIS — Z9889 Other specified postprocedural states: Secondary | ICD-10-CM

## 2021-05-04 DIAGNOSIS — E1122 Type 2 diabetes mellitus with diabetic chronic kidney disease: Secondary | ICD-10-CM

## 2021-05-04 DIAGNOSIS — I82412 Acute embolism and thrombosis of left femoral vein: Secondary | ICD-10-CM | POA: Diagnosis not present

## 2021-05-04 DIAGNOSIS — N1832 Chronic kidney disease, stage 3b: Secondary | ICD-10-CM

## 2021-05-04 DIAGNOSIS — I998 Other disorder of circulatory system: Secondary | ICD-10-CM | POA: Diagnosis not present

## 2021-05-04 DIAGNOSIS — R040 Epistaxis: Secondary | ICD-10-CM | POA: Diagnosis not present

## 2021-05-04 DIAGNOSIS — G4733 Obstructive sleep apnea (adult) (pediatric): Secondary | ICD-10-CM | POA: Diagnosis not present

## 2021-05-04 DIAGNOSIS — E782 Mixed hyperlipidemia: Secondary | ICD-10-CM | POA: Diagnosis not present

## 2021-05-04 DIAGNOSIS — M1612 Unilateral primary osteoarthritis, left hip: Secondary | ICD-10-CM | POA: Diagnosis not present

## 2021-05-04 DIAGNOSIS — I5032 Chronic diastolic (congestive) heart failure: Secondary | ICD-10-CM | POA: Diagnosis not present

## 2021-05-04 DIAGNOSIS — I38 Endocarditis, valve unspecified: Secondary | ICD-10-CM | POA: Diagnosis not present

## 2021-05-04 DIAGNOSIS — I10 Essential (primary) hypertension: Secondary | ICD-10-CM | POA: Diagnosis not present

## 2021-05-11 ENCOUNTER — Ambulatory Visit: Payer: Medicare Other | Admitting: Nurse Practitioner

## 2021-05-11 DIAGNOSIS — E119 Type 2 diabetes mellitus without complications: Secondary | ICD-10-CM | POA: Diagnosis not present

## 2021-05-11 DIAGNOSIS — H5213 Myopia, bilateral: Secondary | ICD-10-CM | POA: Diagnosis not present

## 2021-05-11 DIAGNOSIS — H26493 Other secondary cataract, bilateral: Secondary | ICD-10-CM | POA: Diagnosis not present

## 2021-05-11 DIAGNOSIS — H1045 Other chronic allergic conjunctivitis: Secondary | ICD-10-CM | POA: Diagnosis not present

## 2021-05-11 DIAGNOSIS — E11319 Type 2 diabetes mellitus with unspecified diabetic retinopathy without macular edema: Secondary | ICD-10-CM | POA: Diagnosis not present

## 2021-05-15 ENCOUNTER — Encounter: Payer: Self-pay | Admitting: Nurse Practitioner

## 2021-05-18 ENCOUNTER — Ambulatory Visit (INDEPENDENT_AMBULATORY_CARE_PROVIDER_SITE_OTHER): Payer: Medicare Other | Admitting: Nurse Practitioner

## 2021-05-18 ENCOUNTER — Encounter: Payer: Self-pay | Admitting: Nurse Practitioner

## 2021-05-18 VITALS — BP 106/72 | HR 78 | Temp 98.3°F | Resp 16 | Ht 72.0 in | Wt 247.2 lb

## 2021-05-18 DIAGNOSIS — H6123 Impacted cerumen, bilateral: Secondary | ICD-10-CM

## 2021-05-18 DIAGNOSIS — N1832 Chronic kidney disease, stage 3b: Secondary | ICD-10-CM

## 2021-05-18 DIAGNOSIS — I10 Essential (primary) hypertension: Secondary | ICD-10-CM

## 2021-05-18 DIAGNOSIS — E1122 Type 2 diabetes mellitus with diabetic chronic kidney disease: Secondary | ICD-10-CM | POA: Diagnosis not present

## 2021-05-18 DIAGNOSIS — Z794 Long term (current) use of insulin: Secondary | ICD-10-CM

## 2021-05-18 DIAGNOSIS — R040 Epistaxis: Secondary | ICD-10-CM

## 2021-05-18 LAB — POCT GLYCOSYLATED HEMOGLOBIN (HGB A1C): Hemoglobin A1C: 7.1 % — AB (ref 4.0–5.6)

## 2021-05-18 NOTE — Progress Notes (Signed)
[facility] ?[address] ?[place], [place] [address] ? ?Internal MEDICINE  ?Office Visit Note ? ?Patient Name: [name] ? [id number]  ?[id number] ? ?Date of Service: 05/18/2021 ? ?Chief Complaint  ?Patient presents with  ? Follow-up  ?  Nose bleeds 1 or 2 times weekly   ? Diabetes  ? Hypertension  ? ? ?HPI ?[employer] presents for follow-up visit for diabetes and hypertension.  He is also having nosebleeds 1-2 times a week that lasts approximately 15 minutes at a time and this does not seem to be related to any sort of allergies or dryness.  He does have an upcoming appointment with ENT to have these nosebleeds further evaluated at which time he and his wife also plan to have his ears checked to see if he is able to get hearing aids covered by his insurance. ?As of his last couple of office visits his A1c was 8.3 twice in a row.  At his last office visit Rybelsus was added to his medication regimen and he is currently on 7 mg daily.  His A1c was 7.1 today which was a significant improvement from 8.3 in January.  He is currently on Rybelsus 7 mg daily and Farxiga 10 mg daily.  He was also taking 10 units daily of Toujeo but reports that he stopped this when his blood sugar level started coming down with Rybelsus.  He currently remains stable with the 2 oral medications and no long-acting insulin ?  ? ?Current Medication: ?Outpatient Encounter Medications as of 05/18/2021  ?Medication Sig Note  ? albuterol (VENTOLIN HFA) 108 (90 Base) MCG/ACT inhaler Inhale 2 puffs into the lungs every 4 (four) hours as needed for wheezing or shortness of breath.   ? amLODipine (NORVASC) 2.5 MG tablet TAKE 1 TABLET(2.5 MG) BY MOUTH DAILY   ? apixaban (ELIQUIS) 5 MG TABS tablet Take 5 mg by mouth 2 (two) times daily.   ? atorvastatin (LIPITOR) 10 MG tablet Take 1 tablet (10 mg total) by mouth daily. 12/25/2020: LF 10.27.22  ? FARXIGA 10 MG TABS tablet TAKE 1 TABLET(10 MG) BY MOUTH DAILY BEFORE BREAKFAST   ? ferrous sulfate  325 (65 FE) MG tablet Take 325 mg by mouth daily.  12/25/2020: LF 10.27.22  ? glucose blood (ONETOUCH VERIO) test strip USE ONCE DAILY. DX E11.65.   ? insulin glargine, 2 Unit Dial, (TOUJEO MAX SOLOSTAR) 300 UNIT/ML Solostar Pen Inject 10 Units into the skin daily.   ? Insulin Pen Needle (PEN NEEDLES 3/16") 31G X 5 MM MISC To use daily with basal insulin injections   ? metoprolol tartrate (LOPRESSOR) 25 MG tablet Take 1 tablet (25 mg total) by mouth 2 (two) times daily. 12/25/2020: LF 11.19.22  ? pantoprazole (PROTONIX) 40 MG tablet TAKE 1 TABLET(40 MG) BY MOUTH DAILY   ? Semaglutide (RYBELSUS) 7 MG TABS Take 7 mg by mouth daily.   ? Tiotropium Bromide Monohydrate (SPIRIVA RESPIMAT) 2.5 MCG/ACT AERS Inhale 2 puffs into the lungs daily.   ? vitamin B-12 (CYANOCOBALAMIN) 1000 MCG tablet Take 1 tablet (1,000 mcg total) by mouth daily. 12/25/2020: Not on pt med list from pharmacy  ? [DISCONTINUED] apixaban (ELIQUIS) 5 MG TABS tablet Take 1 tablet (5 mg total) by mouth 2 (two) times daily.   ? [DISCONTINUED] aspirin EC 81 MG EC tablet Take 1 tablet (81 mg total) by mouth daily. Swallow whole. (Patient not taking: Reported on 05/04/2021)   ? [DISCONTINUED] benzonatate (TESSALON) 200 MG capsule Take 1 capsule (200  mg total) by mouth 2 (two) times daily as needed for cough. (Patient not taking: Reported on 05/04/2021)   ? ?No facility-administered encounter medications on file as of 05/18/2021.  ? ? ?Surgical History: ?Past Surgical History:  ?Procedure Laterality Date  ? CATARACT EXTRACTION    ? COLONOSCOPY WITH PROPOFOL N/A 07/02/2019  ? Procedure: COLONOSCOPY WITH PROPOFOL;  Surgeon: Lin Landsman, MD;  Location: [facility] ENDOSCOPY;  Service: Gastroenterology;  Laterality: N/A;  ? IR CT HEAD LTD  08/27/2019  ? IR PERCUTANEOUS ART THROMBECTOMY/INFUSION INTRACRANIAL INC DIAG ANGIO  08/27/2019  ?    ? IR PERCUTANEOUS ART THROMBECTOMY/INFUSION INTRACRANIAL INC DIAG ANGIO  08/27/2019  ? LOWER EXTREMITY ANGIOGRAPHY Left 12/25/2020  ?  Procedure: Lower Extremity Angiography;  Surgeon: Algernon Huxley, MD;  Location: [facility] CV LAB;  Service: Cardiovascular;  Laterality: Left;  ? PACEMAKER LEADLESS INSERTION N/A 12/19/2020  ? Procedure: PACEMAKER LEADLESS INSERTION;  Surgeon: Isaias Cowman, MD;  Location: [facility] CV LAB;  Service: Cardiovascular;  Laterality: N/A;  ? PROSTATE CANCER    ? PROSTATE SURGERY    ? RADIOLOGY WITH ANESTHESIA N/A 08/27/2019  ? Procedure: IR WITH ANESTHESIA;  Surgeon: Luanne Bras, MD;  Location: [facility];  Service: Radiology;  Laterality: N/A;  ? ? ?Medical History: ?Past Medical History:  ?Diagnosis Date  ? Atrial fibrillation ([place])   ? CHF (congestive heart failure) ([place])   ? Diabetes ([place])   ? Hearing loss   ? High blood pressure   ? History of bladder problems   ? Kidney stones 11/22/2014  ? Prostate cancer ([place])   ? Prostate cancer ([place])   ? Stroke [facility])   ? ? ?Family History: ?Family History  ?Problem Relation Age of Onset  ? Colon cancer Mother   ? Diabetes Other   ? High blood pressure Other   ? Prostate cancer Brother   ? Diabetes Brother   ? ? ?Social History  ? ?Socioeconomic History  ? Marital status: Married  ?  Spouse name: Ehan Freas  ? Number of children: 2  ? Years of education: 48  ? Highest education level: 12th grade  ?Occupational History  ? Occupation: [employer]  ?Tobacco Use  ? Smoking status: Former  ?  Years: 16.00  ?  Types: Cigarettes  ? Smokeless tobacco: Never  ?Vaping Use  ? Vaping Use: Never used  ?Substance and Sexual Activity  ? Alcohol use: No  ? Drug use: No  ? Sexual activity: Not Currently  ?Other Topics Concern  ? Not on file  ?Social History Narrative  ? Not on file  ? ?Social Determinants of Health  ? ?Financial Resource Strain: Not on file  ?Food Insecurity: Not on file  ?Transportation Needs: Not on file  ?Physical Activity: Not on file  ?Stress: Not on file  ?Social Connections: Not on file  ?Intimate Partner Violence: Not on file  ? ? ? ? ?Review of Systems   ?Constitutional:  Negative for chills, fatigue and unexpected weight change.  ?HENT:  Positive for nosebleeds. Negative for congestion, rhinorrhea, sneezing and sore throat.   ?Eyes:  Negative for redness.  ?Respiratory: Negative.  Negative for cough, chest tightness, shortness of breath and wheezing.   ?Cardiovascular: Negative.  Negative for chest pain and palpitations.  ?Gastrointestinal:  Negative for abdominal pain, constipation, diarrhea, nausea and vomiting.  ?Genitourinary:  Negative for dysuria and frequency.  ?Musculoskeletal:  Negative for arthralgias, back pain, joint swelling and neck pain.  ?Neurological: Negative.   ?Psychiatric/Behavioral:  Negative for behavioral problems (Depression), sleep disturbance and suicidal ideas. The patient is not nervous/anxious.   ? ?Vital Signs: ?BP 106/72   Pulse 78   Temp 98.3 ?F (36.8 ?C)   Resp 16   Ht 6' (1.829 m)   Wt 247 lb 3.2 oz (112.1 kg)   SpO2 97%   BMI 33.53 kg/m?  ? ? ?Physical Exam ?Vitals reviewed.  ?Constitutional:   ?   General: He is not in acute distress. ?   Appearance: Normal appearance. He is obese. He is not ill-appearing.  ?HENT:  ?   Head: Normocephalic and atraumatic.  ?Eyes:  ?   Pupils: Pupils are equal, round, and reactive to light.  ?Cardiovascular:  ?   Rate and Rhythm: Normal rate and regular rhythm.  ?Pulmonary:  ?   Effort: Pulmonary effort is normal. No respiratory distress.  ?Neurological:  ?   Mental Status: He is alert and oriented to person, place, and time.  ?Psychiatric:     ?   Mood and Affect: Mood normal.     ?   Behavior: Behavior normal.  ? ? ? ? ? ?Assessment/Plan: ?1. Type 2 diabetes mellitus with stage 3b chronic kidney disease, with long-term current use of insulin ([place]) ?A1C significantly improved with rybelsus and farxiga. He has stopped the toujeo. Will repeat A1C in 3 months and follow up then.  ?- POCT HgB A1C ? ?2. Essential hypertension ?Stable and well controlled on current medications, no changes.   ? ?3. Recurrent epistaxis ?Has upcoming appt with ENT ? ?4. Bilateral hearing loss due to cerumen impaction ?Has upcoming appt with ENT ? ? ?General Counseling: iker nuttall understanding of the findings of tod

## 2021-05-20 ENCOUNTER — Encounter: Payer: Self-pay | Admitting: Nurse Practitioner

## 2021-05-20 ENCOUNTER — Encounter (INDEPENDENT_AMBULATORY_CARE_PROVIDER_SITE_OTHER): Payer: Self-pay | Admitting: Nurse Practitioner

## 2021-05-20 DIAGNOSIS — G4733 Obstructive sleep apnea (adult) (pediatric): Secondary | ICD-10-CM | POA: Diagnosis not present

## 2021-05-20 NOTE — Progress Notes (Signed)
? ?Subjective:  ? ? Patient ID: [name], male    DOB: 1933-03-08, 88 y.o.   MRN: [id number] ?No chief complaint on file. ? ? ?The patient returns to the office for followup and review of the noninvasive studies.  ? ?There have been no interval changes in lower extremity symptoms. No interval shortening of the patient's claudication distance or development of rest pain symptoms. No new ulcers or wounds have occurred since the last visit. ? ?There have been no significant changes to the patient's overall health care. ? ?The patient denies amaurosis fugax or recent TIA symptoms. There are no documented recent neurological changes noted. ?There is no history of DVT, PE or superficial thrombophlebitis. ?The patient denies recent episodes of angina or shortness of breath.  ? ?ABI Rt=1.19 and Lt=1.28  (previous ABI's Rt=[place] and Lt=[place]) ?Duplex ultrasound of the left lower extremity shows triphasic/biphasic waveforms with normal toe waveforms.  The right lower extremity also has triphasic waveforms and normal toe waveforms. ? ? ?Review of Systems  ?Musculoskeletal:  Positive for arthralgias.  ?All other systems reviewed and are negative. ? ?   ?Objective:  ? Physical Exam ?Vitals reviewed.  ?HENT:  ?   Head: Normocephalic.  ?Cardiovascular:  ?   Rate and Rhythm: Normal rate.  ?   Pulses: Normal pulses.  ?Pulmonary:  ?   Effort: Pulmonary effort is normal.  ?Skin: ?   General: Skin is warm and dry.  ?Neurological:  ?   Mental Status: He is alert and oriented to person, place, and time.  ?Psychiatric:     ?   Mood and Affect: Mood normal.     ?   Behavior: Behavior normal.     ?   Thought Content: Thought content normal.     ?   Judgment: Judgment normal.  ? ? ?BP 105/68 (BP Location: Right Arm)   Pulse 75   Resp 18   Ht 6' (1.829 m)   Wt 245 lb 12.8 oz (111.5 kg)   BMI 33.34 kg/m?  ? ?Past Medical History:  ?Diagnosis Date  ? Atrial fibrillation ([place])   ? CHF (congestive heart failure) ([place])   ? Diabetes ([place])   ?  Hearing loss   ? High blood pressure   ? History of bladder problems   ? Kidney stones 11/22/2014  ? Prostate cancer ([place])   ? Prostate cancer ([place])   ? Stroke [facility])   ? ? ?Social History  ? ?Socioeconomic History  ? Marital status: Married  ?  Spouse name: Ivin Rosenbloom  ? Number of children: 2  ? Years of education: 78  ? Highest education level: 12th grade  ?Occupational History  ? Occupation: [employer]  ?Tobacco Use  ? Smoking status: Former  ?  Years: 16.00  ?  Types: Cigarettes  ? Smokeless tobacco: Never  ?Vaping Use  ? Vaping Use: Never used  ?Substance and Sexual Activity  ? Alcohol use: No  ? Drug use: No  ? Sexual activity: Not Currently  ?Other Topics Concern  ? Not on file  ?Social History Narrative  ? Not on file  ? ?Social Determinants of Health  ? ?Financial Resource Strain: Not on file  ?Food Insecurity: Not on file  ?Transportation Needs: Not on file  ?Physical Activity: Not on file  ?Stress: Not on file  ?Social Connections: Not on file  ?Intimate Partner Violence: Not on file  ? ? ?Past Surgical History:  ?Procedure Laterality Date  ? CATARACT EXTRACTION    ?  COLONOSCOPY WITH PROPOFOL N/A 07/02/2019  ? Procedure: COLONOSCOPY WITH PROPOFOL;  Surgeon: Lin Landsman, MD;  Location: [facility] ENDOSCOPY;  Service: Gastroenterology;  Laterality: N/A;  ? IR CT HEAD LTD  08/27/2019  ? IR PERCUTANEOUS ART THROMBECTOMY/INFUSION INTRACRANIAL INC DIAG ANGIO  08/27/2019  ?    ? IR PERCUTANEOUS ART THROMBECTOMY/INFUSION INTRACRANIAL INC DIAG ANGIO  08/27/2019  ? LOWER EXTREMITY ANGIOGRAPHY Left 12/25/2020  ? Procedure: Lower Extremity Angiography;  Surgeon: Algernon Huxley, MD;  Location: [place] CV LAB;  Service: Cardiovascular;  Laterality: Left;  ? PACEMAKER LEADLESS INSERTION N/A 12/19/2020  ? Procedure: PACEMAKER LEADLESS INSERTION;  Surgeon: Isaias Cowman, MD;  Location: [place] CV LAB;  Service: Cardiovascular;  Laterality: N/A;  ? PROSTATE CANCER    ? PROSTATE SURGERY    ? RADIOLOGY WITH  ANESTHESIA N/A 08/27/2019  ? Procedure: IR WITH ANESTHESIA;  Surgeon: Luanne Bras, MD;  Location: [place];  Service: Radiology;  Laterality: N/A;  ? ? ?Family History  ?Problem Relation Age of Onset  ? Colon cancer Mother   ? Diabetes Other   ? High blood pressure Other   ? Prostate cancer Brother   ? Diabetes Brother   ? ? ?No Known Allergies ? ? ?  Latest Ref Rng & Units 04/10/2021  ? 10:38 AM 01/03/2021  ? 11:21 AM 12/27/2020  ?  2:50 AM  ?CBC  ?WBC 4.0 - 10.5 K/uL 6.7   6.8   6.2    ?Hemoglobin 13.0 - 17.0 g/dL 15.0   14.5   12.7    ?Hematocrit 39.0 - 52.0 % 47.9   45.3   40.9    ?Platelets 150 - 400 K/uL 184   245   168    ? ? ? ? ?CMP  ?   ?Component Value Date/Time  ? NA 135 04/10/2021 1038  ? NA 144 06/11/2019 0953  ? NA 138 10/13/2013 0415  ? K 4.2 04/10/2021 1038  ? K 4.0 10/13/2013 0415  ? CL 101 04/10/2021 1038  ? CL 106 10/13/2013 0415  ? CO2 25 04/10/2021 1038  ? CO2 27 10/13/2013 0415  ? GLUCOSE 112 (H) 04/10/2021 1038  ? GLUCOSE 97 10/13/2013 0415  ? BUN 31 (H) 04/10/2021 1038  ? BUN 37 (H) 06/11/2019 0953  ? BUN 22 (H) 10/13/2013 0415  ? CREATININE 2.13 (H) 04/10/2021 1038  ? CREATININE 1.65 (H) 10/13/2013 0415  ? CALCIUM 9.2 04/10/2021 1038  ? CALCIUM 8.2 (L) 10/13/2013 0415  ? PROT 7.9 04/10/2021 1038  ? PROT 7.3 06/11/2019 0953  ? PROT 7.4 10/12/2013 0732  ? ALBUMIN 3.9 04/10/2021 1038  ? ALBUMIN 4.4 06/11/2019 0953  ? ALBUMIN 3.0 (L) 10/12/2013 0732  ? AST 27 04/10/2021 1038  ? AST 22 10/12/2013 0732  ? ALT 29 04/10/2021 1038  ? ALT 29 10/12/2013 0732  ? ALKPHOS 115 04/10/2021 1038  ? ALKPHOS 128 (H) 10/12/2013 0732  ? BILITOT 0.3 04/10/2021 1038  ? BILITOT 0.6 06/11/2019 0953  ? BILITOT 0.3 10/12/2013 0732  ? GFRNONAA 30 (L) 04/10/2021 1038  ? GFRNONAA 39 (L) 10/13/2013 0415  ? GFRAA 28 (L) 10/12/2019 1035  ? GFRAA 45 (L) 10/13/2013 0415  ? ? ? ?VAS [place] ABI WITH/WO TBI ? ?Result Date: 05/09/2021 ? LOWER EXTREMITY DOPPLER STUDY Patient Name:  [name]  Date of Exam:   05/04/2021 Medical  Rec #: [id number]          Accession #:    [id number] Date of Birth:  05-29-1933           Patient Gender: M Patient Age:   65 years Exam Location:  [facility] Procedure:      VAS [place] ABI WITH/WO TBI Referring Phys: Leotis Pain --------------------------------------------------------------------------------  Indications: Peripheral artery disease.  Vascular Interventions: 12/5 2022 Left thrombectomy popliteal artery. Comparison Study: 01/26/2021 Performing Technologist: Almira Coaster RVS  Examination Guidelines: A complete evaluation includes at minimum, Doppler waveform signals and systolic blood pressure reading at the level of bilateral brachial, anterior tibial, and posterior tibial arteries, when vessel segments are accessible. Bilateral testing is considered an integral part of a complete examination. Photoelectric Plethysmograph (PPG) waveforms and toe systolic pressure readings are included as required and additional duplex testing as needed. Limited examinations for reoccurring indications may be performed as noted.  ABI Findings: +---------+------------------+-----+---------+--------+ Right    Rt Pressure (mmHg)IndexWaveform Comment  +---------+------------------+-----+---------+--------+ Brachial 134                                      +---------+------------------+-----+---------+--------+ ATA      159               1.19 triphasic         +---------+------------------+-----+---------+--------+ PTA      157               1.17 triphasic         +---------+------------------+-----+---------+--------+ Great Toe123               0.92 Normal            +---------+------------------+-----+---------+--------+ +---------+------------------+-----+---------+-------+ Left     Lt Pressure (mmHg)IndexWaveform Comment +---------+------------------+-----+---------+-------+ Brachial 132                                      +---------+------------------+-----+---------+-------+ ATA      142               1.06 triphasic        +---------+------------------+-----+---------+-------+ PTA                             biphasic         +---------+------------------+-----+---------+-------+ PERO     171               1.28 bipha

## 2021-05-21 ENCOUNTER — Other Ambulatory Visit: Payer: Self-pay | Admitting: *Deleted

## 2021-05-21 NOTE — Patient Outreach (Signed)
[facility]) Care Management Telephonic RN Care Manager Note   05/21/2021 Name:  [name] MRN:  [id number] DOB:  1933/10/29  Summary: [name] call placed to member, successful to wife.  Denies any urgent concerns, encouraged to contact this care manager with questions.    Subjective: [name] is an 88 y.o. year old male who is a primary patient of [name], NP. The care management team was consulted for assistance with care management and/or care coordination needs.    Telephonic RN Care Manager completed Telephone Visit today.  Objective:   Medications Reviewed Today     Reviewed by [name], NP (Nurse Practitioner) on 05/20/21 at 1137  Med List Status: <None>   Medication Order Taking? Sig Documenting Provider Last Dose Status Informant  albuterol (VENTOLIN HFA) 108 (90 Base) MCG/ACT inhaler [id number] Yes Inhale 2 puffs into the lungs every 4 (four) hours as needed for wheezing or shortness of breath. [name], NP Taking Active   amLODipine (NORVASC) 2.5 MG tablet [id number] Yes TAKE 1 TABLET(2.5 MG) BY MOUTH DAILY Abernathy, Alyssa, NP Taking Active   apixaban (ELIQUIS) 5 MG TABS tablet [id number] Yes Take 5 mg by mouth 2 (two) times daily. [provider] Taking Active   atorvastatin (LIPITOR) 10 MG tablet [id number] Yes Take 1 tablet (10 mg total) by mouth daily. [name], NP Taking Active Pharmacy Records           Med Note Etheleen Sia Dec 25, 2020  8:13 PM) LF 10.27.22  FARXIGA 10 MG TABS tablet [id number] Yes TAKE 1 TABLET(10 MG) BY MOUTH DAILY BEFORE BREAKFAST McDonough, Lauren K, PA-C Taking Active   ferrous sulfate 325 (65 FE) MG tablet [id number] Yes Take 325 mg by mouth daily.  [provider] Taking Active Pharmacy Records           Med Note Etheleen Sia Dec 25, 2020  8:14 PM) LF 10.27.22  glucose blood [facility] VERIO) test strip [id number] Yes USE ONCE DAILY. DX  E11.65. [name], NP Taking Active Pharmacy Records  insulin glargine, 2 Unit Dial, (TOUJEO MAX SOLOSTAR) 300 UNIT/ML Solostar Pen [id number] Yes Inject 10 Units into the skin daily. [name], NP Taking Active   Insulin Pen Needle (PEN NEEDLES 3/16") 31G X 5 MM MISC [id number] Yes To use daily with basal insulin injections [name], NP Taking Active Pharmacy Records  metoprolol tartrate (LOPRESSOR) 25 MG tablet [id number] Yes Take 1 tablet (25 mg total) by mouth 2 (two) times daily. [name], NP Taking Active Pharmacy Records           Med Note Etheleen Sia Dec 25, 2020  8:18 PM) LF 11.19.22  pantoprazole (PROTONIX) 40 MG tablet [id number] Yes TAKE 1 TABLET(40 MG) BY MOUTH DAILY Abernathy, Alyssa, NP Taking Active   Semaglutide (RYBELSUS) 7 MG TABS [id number] Yes Take 7 mg by mouth daily. Lyndon Code, MD Taking Active   Tiotropium Bromide Monohydrate (SPIRIVA RESPIMAT) 2.5 MCG/ACT AERS [id number] Yes Inhale 2 puffs into the lungs daily. [name], NP Taking Active   vitamin B-12 (CYANOCOBALAMIN) 1000 MCG tablet [id number] Yes Take 1 tablet (1,000 mcg total) by mouth daily. [name], NP Taking Active Pharmacy Records           Med Note Etheleen Sia Dec 25, 2020  8:21 PM) Not on pt med list from pharmacy  SDOH:  (Social Determinants of Health) assessments and interventions performed:     Care Plan  Review of patient past medical history, allergies, medications, health status, including review of consultants reports, laboratory and other test data, was performed as part of comprehensive evaluation for care management services.   Care Plan : General Plan of Care (Adult)  Updates made by [name], RN since 05/21/2021 12:00 AM     Problem: Knowledge deficit regarding chronic disease management (DM, A-fib, CHF and prostate cancer) as evidenced by elevated A1C and PSA   Priority: High     Long-Range Goal:  Member will show management of DM and prostate cancer evidenced by decreaesed A1C and PSA (goal extended as memer continues to work to achieve)   Start Date: 11/03/2020  Expected End Date: 11/03/2021  This Visit's Progress: On track  Recent Progress: On track  Priority: High  Note:   Current Barriers:  Knowledge Deficits related to plan of care for management of Atrial Fibrillation, CHF, DMII, and prostate cancer  Chronic Disease Management support and education needs related to Atrial Fibrillation, CHF, DMII, and prostate cancer  RNCM Clinical Goal(s):  Patient will verbalize understanding of plan for management of Atrial Fibrillation, CHF, DMII, and Gout as evidenced by member/wife reporting stability in health take all medications exactly as prescribed and will call provider for medication related questions as evidenced by reported adherence by wife/member    demonstrate understanding of rationale for each prescribed medication as evidenced by for cancer, CHF, A-fib and DM treatment.    attend all scheduled medical appointments: PCP, ONcology, and neurology as evidenced by PCP, ONcology, cardiology, and neurology        continue to work with RN Care Manager and/or Social Worker to address care management and care coordination needs related to Atrial Fibrillation, CHF, DMII, and Gout as evidenced by adherence to CM Team Scheduled appointments     through collaboration with RN Care manager, provider, and care team.   Interventions: Inter-disciplinary care team collaboration (see longitudinal plan of care) Evaluation of current treatment plan related to  self management and patient's adherence to plan as established by provider   Diabetes:  (Status: Goal on Track (progressing): YES.) Lab Results  Component Value Date   HGBA1C 7.1 (A) 05/18/2021  Assessed patient's understanding of A1c goal: <7% Provided education to patient about basic DM disease process; Reviewed medications with  patient and discussed importance of medication adherence;        Reviewed prescribed diet with patient low carb/sugar; Counseled on importance of regular laboratory monitoring as prescribed;        Advised patient, providing education and rationale, to check cbg daily and record         Oncology:  (Status: Goal on Track (progressing): YES.) Assessment of understanding of oncology diagnosis:  Assessed patient understanding of cancer diagnosis and recommended treatment plan, Assessed available transportation to appointments and treatments. Has consistent/reliable transportation: Yes, and Assessed support system. Has consistent/reliable family or other support: Yes  Heart Failure Interventions: Basic overview and discussion of pathophysiology of Heart Failure reviewed; Provided education on low sodium diet; Discussed importance of daily weight and advised patient to weigh and record daily; Reviewed role of diuretics in prevention of fluid overload and management of heart failure; Discussed the importance of keeping all appointments with provider;  AFIB Interventions:    Counseled on increased risk of stroke due to Afib and benefits of anticoagulation for stroke prevention; Reviewed importance of adherence to  anticoagulant exactly as prescribed; Counseled on seeking medical attention after a head injury or if there is blood in the urine/stool; Afib action plan reviewed;   Update 11/11 - Per wife, member has been doing well.  State he has continued to have intermittent chest discomfort as well as shortness of breath.  Was seen by cardiology yesterday, plan for pacemake placement on 11/29.  She is unclear about reason for device, educated on member's history and the goal of management of A-fib/CHF after intervention.  She verbalizes understanding.  She does not drive often, encouraged to call son and daughter in law to secure transportation to hospital.  Blood pressure today was 129/85, HR 73, blood  sugar 140.  She is unable to find his weight for today, he is currently not available.  Noted that neurology appointment for 11/3 was canceled, will assist with rescheduling after discharge home following pacer placement.   Patient Goals/Self-Care Activities: Patient will self administer medications as prescribed as evidenced by self report/primary caregiver report  Patient will attend all scheduled provider appointments as evidenced by clinician review of documented attendance to scheduled appointments and patient/caregiver report Patient will call pharmacy for medication refills as evidenced by patient report and review of pharmacy fill history as appropriate Patient will continue to perform ADL's independently as evidenced by patient/caregiver report   Update 12/8 - Noted that member was hospitalized twice in the last month, one for planned pacemaker implantation and for limb ischemia requiring vascular surgery.  Spoke with member and wife, they both report member is doing well since surgery.  Conference call placed vascular provider to request follow up appointment, they will call member/wife directly to schedule.  He already has appointment scheduled with PCP on 12/14 for labs and hospital follow up as well as tests scheduled on 12/16 for cancer scans.  Wife repeatedly state she is overwhelmed with managing member's care however is reluctant to ask son and daughter in law for help.  Encouraged to contact them and make them aware of upcoming appointments, advised to provide this care manager's contact information for discussion as well.   Update 12/14 - Wife report member is out driving today, went to MD office for labs, will return home to pick her up prior to PCP appointment.  She state he has been doing well, has not had any further pain.  Conference calls placed to Vascular and neurology office, appointments made for 1/6 at 1pm (vascular) and 1/19 at 145pm (neurology).  Reminded of CT scans  scheduled for Friday, 12/16.   Update 1/11 - Per wife, member doing better.  He is still going to work for a few hours several days a week.  Was seen by cardiology on 12/19, cardiac rehab was discussed.  Wife is unaware of this recommendation, state he has another appointment with their office today or tomorrow.  PCP appointment completed on 12/14, insulin dose was increased due to elevated blood sugars, per wife numbers are better, unable to find notebook with all readings but state it was 150 yesterday.  He will follo wup with PCP again on 1/27.  He attended appointment with oncology on 12/23, notified that PSA has increased again to 60, no signs of metastasis to bone.  Member does not wish to have any further treatment.  Follow up from vascular surgery completed on 1/6, no issues noted.  Reminded of upcoming appointment with neurology on 1/19, she will accompany him to this appointment.  Encouraged again to have son and daughter  in law more involved in care, she state they have been helping more with management of both of their conditions.    Update 2/8 - Member not available, current at work.  Was able to speak to wife.  Was seen by PCP a couple weeks ago, A1C remains elevated at 8.3.  Toujeo increased to 16 units a day and Rybelsus added to medication regime.  She report member's blood sugars have ranged from 140-190, will follow up with PCP on 2/24.  Member is not consistent with maintaining his diabetic diet, wife educated on proper foods to eat, will send education.  Blood pressure and heart rate have remained stable, yesterday was 122/84 and 81.  Neurology appointment scheduled for 1/19 was canceled, member/wife will call to reschedule.   Update 3/6 - Spoke with wife, state member is doing well, he is working today, unable to speak directly to member.  She report his blood sugars have ranged 110-140's at times but can still be elevated some days.  She is unable to find member's log today but will  look for it to confirm if values are stable.  Has appointments with oncology on 3/23, vascular on 4/7, podiatry on 4/10, and PCP on 4/21 (will have repeat A1C at that time).  Discussed need to reschedule neurology appointment, she prefer to wait to discuss with member to see if he wants to go.  If he does, she will call this care manager to assist with scheduling.    Update 4/4 - Wife available, member at work.  State she has tried to talk to him regarding his work schedule, encouraged him to retire completely but he refuses.  She report he has been doing well otherwise.  Blood sugar was 126 today and blood pressure was 116/76.  Weight has been stable.  He has not complained of any pain or discomfort.  Follow up with oncology was 3/23, both member and wife continue to decline further treatment for prostate cancer despite PSA being elevated at 83.98.  Reviewed upcoming appointments:  podiatry on 4/10, vascular changed to 4/14, and PCP on 4/21.   Update 5/1 - Wife report member is doing well. Confirms he attended all appointments since last outreach (podiatry, vascular, and primary).  A1C decreasing towards goal, report blood sugars have been trending better (90s-150s).  Weight has remained stable.  Blood pressure and heart rate are both well controlled.  Range of BP is 110s-120s/70s, HR in the 70s.  Wife state member is having intermittent nosebleeds, scheduled to follow up with ENT.        Plan:  Telephone follow up appointment with care management team member scheduled for:  1 month The patient has been provided with contact information for the care management team and has been advised to call with any health related questions or concerns.   [name], RN, MSN, CCM [facility] Care Management  [facility] Manager [phone]

## 2021-06-19 DIAGNOSIS — G4733 Obstructive sleep apnea (adult) (pediatric): Secondary | ICD-10-CM | POA: Diagnosis not present

## 2021-06-19 DIAGNOSIS — R040 Epistaxis: Secondary | ICD-10-CM | POA: Diagnosis not present

## 2021-06-21 ENCOUNTER — Ambulatory Visit: Payer: Medicare Other | Admitting: *Deleted

## 2021-06-25 ENCOUNTER — Other Ambulatory Visit: Payer: Self-pay | Admitting: *Deleted

## 2021-06-25 NOTE — Patient Outreach (Signed)
[facility]) Care Management  06/25/2021  [name] 10/07/1933 [id number]   Outgoing call placed to member/wife, unsuccessful.  HIPAA compliant voice message left, will follow up within the next 2 business days.  [name], [place], MSN [place] [phone]

## 2021-06-27 ENCOUNTER — Other Ambulatory Visit: Payer: Self-pay | Admitting: *Deleted

## 2021-06-27 NOTE — Patient Outreach (Signed)
[facility]) Care Management Telephonic RN Care Manager Note   06/27/2021 Name:  [name] MRN:  [id number] DOB:  1933/10/03  Summary: Outreach attempt #2, successful to wife.  Denies any urgent concerns, encouraged to contact this care manager with questions.    Subjective: [name] is an 88 y.o. year old male who is a primary patient of Jonetta Osgood, NP. The care management team was consulted for assistance with care management and/or care coordination needs.    Telephonic RN Care Manager completed Telephone Visit today.  Objective:   Medications Reviewed Today     Reviewed by Jonetta Osgood, NP (Nurse Practitioner) on 05/20/21 at 1137  Med List Status: <None>   Medication Order Taking? Sig Documenting Provider Last Dose Status Informant  albuterol (VENTOLIN HFA) 108 (90 Base) MCG/ACT inhaler [id number] Yes Inhale 2 puffs into the lungs every 4 (four) hours as needed for wheezing or shortness of breath. Jonetta Osgood, NP Taking Active   amLODipine (NORVASC) 2.5 MG tablet [id number] Yes TAKE 1 TABLET(2.5 MG) BY MOUTH DAILY Abernathy, Alyssa, NP Taking Active   apixaban (ELIQUIS) 5 MG TABS tablet [id number] Yes Take 5 mg by mouth 2 (two) times daily. [provider] Taking Active   atorvastatin (LIPITOR) 10 MG tablet [id number] Yes Take 1 tablet (10 mg total) by mouth daily. Jonetta Osgood, NP Taking Active Pharmacy Records           Med Note Debbe Bales Dec 25, 2020  8:13 PM) LF 10.27.22  FARXIGA 10 MG TABS tablet [id number] Yes TAKE 1 TABLET(10 MG) BY MOUTH DAILY BEFORE BREAKFAST McDonough, Lauren K, PA-C Taking Active   ferrous sulfate 325 (65 FE) MG tablet [id number] Yes Take 325 mg by mouth daily.  [provider] Taking Active Pharmacy Records           Med Note Debbe Bales Dec 25, 2020  8:14 PM) LF 10.27.22  glucose blood [facility] VERIO) test strip [id number] Yes USE ONCE DAILY. DX E11.65.  Jonetta Osgood, NP Taking Active Pharmacy Records  insulin glargine, 2 Unit Dial, (TOUJEO MAX SOLOSTAR) 300 UNIT/ML Solostar Pen [id number] Yes Inject 10 Units into the skin daily. Jonetta Osgood, NP Taking Active   Insulin Pen Needle (PEN NEEDLES 3/16") 31G X 5 MM MISC [id number] Yes To use daily with basal insulin injections Jonetta Osgood, NP Taking Active Pharmacy Records  metoprolol tartrate (LOPRESSOR) 25 MG tablet [id number] Yes Take 1 tablet (25 mg total) by mouth 2 (two) times daily. Jonetta Osgood, NP Taking Active Pharmacy Records           Med Note Debbe Bales Dec 25, 2020  8:18 PM) LF 11.19.22  pantoprazole (PROTONIX) 40 MG tablet [id number] Yes TAKE 1 TABLET(40 MG) BY MOUTH DAILY Abernathy, Alyssa, NP Taking Active   Semaglutide (RYBELSUS) 7 MG TABS [id number] Yes Take 7 mg by mouth daily. Lavera Guise, MD Taking Active   Tiotropium Bromide Monohydrate (SPIRIVA RESPIMAT) 2.5 MCG/ACT AERS [id number] Yes Inhale 2 puffs into the lungs daily. Jonetta Osgood, NP Taking Active   vitamin B-12 (CYANOCOBALAMIN) 1000 MCG tablet [id number] Yes Take 1 tablet (1,000 mcg total) by mouth daily. Jonetta Osgood, NP Taking Active Pharmacy Records           Med Note Debbe Bales Dec 25, 2020  8:21 PM) Not on pt med list from pharmacy  SDOH:  (Social Determinants of Health) assessments and interventions performed:     Care Plan  Review of patient past medical history, allergies, medications, health status, including review of consultants reports, laboratory and other test data, was performed as part of comprehensive evaluation for care management services.   Care Plan : General Plan of Care (Adult)  Updates made by [name], RN since 06/27/2021 12:00 AM     Problem: Knowledge deficit regarding chronic disease management (DM, A-fib, CHF and prostate cancer) as evidenced by elevated A1C and PSA   Priority: High     Long-Range Goal: Member  will show management of DM and prostate cancer evidenced by decreaesed A1C and PSA (goal extended as memer continues to work to achieve)   Start Date: 11/03/2020  Expected End Date: 11/03/2021  This Visit's Progress: On track  Recent Progress: On track  Priority: High  Note:   Current Barriers:  Knowledge Deficits related to plan of care for management of Atrial Fibrillation, CHF, DMII, and prostate cancer  Chronic Disease Management support and education needs related to Atrial Fibrillation, CHF, DMII, and prostate cancer  RNCM Clinical Goal(s):  Patient will verbalize understanding of plan for management of Atrial Fibrillation, CHF, DMII, and Gout as evidenced by member/wife reporting stability in health take all medications exactly as prescribed and will call provider for medication related questions as evidenced by reported adherence by wife/member    demonstrate understanding of rationale for each prescribed medication as evidenced by for cancer, CHF, A-fib and DM treatment.    attend all scheduled medical appointments: PCP, ONcology, and neurology as evidenced by PCP, ONcology, cardiology, and neurology        continue to work with [facility] and/or Social Worker to address care management and care coordination needs related to Atrial Fibrillation, CHF, DMII, and Gout as evidenced by adherence to CM Team Scheduled appointments     through collaboration with RN Care manager, provider, and care team.   Interventions: Inter-disciplinary care team collaboration (see longitudinal plan of care) Evaluation of current treatment plan related to  self management and patient's adherence to plan as established by provider   Diabetes:  (Status: Goal on Track (progressing): YES.) Lab Results  Component Value Date   HGBA1C 7.1 (A) 05/18/2021  Assessed patient's understanding of A1c goal: <7% Provided education to patient about basic DM disease process; Reviewed medications with patient and  discussed importance of medication adherence;        Reviewed prescribed diet with patient low carb/sugar; Counseled on importance of regular laboratory monitoring as prescribed;        Advised patient, providing education and rationale, to check cbg daily and record         Oncology:  (Status: Goal on Track (progressing): YES.) Assessment of understanding of oncology diagnosis:  Assessed patient understanding of cancer diagnosis and recommended treatment plan, Assessed available transportation to appointments and treatments. Has consistent/reliable transportation: Yes, and Assessed support system. Has consistent/reliable family or other support: Yes  Heart Failure Interventions: Basic overview and discussion of pathophysiology of Heart Failure reviewed; Provided education on low sodium diet; Discussed importance of daily weight and advised patient to weigh and record daily; Reviewed role of diuretics in prevention of fluid overload and management of heart failure; Discussed the importance of keeping all appointments with provider;  AFIB Interventions:    Counseled on increased risk of stroke due to Afib and benefits of anticoagulation for stroke prevention; Reviewed importance of adherence to  anticoagulant exactly as prescribed; Counseled on seeking medical attention after a head injury or if there is blood in the urine/stool; Afib action plan reviewed;   Update 11/11 - Per wife, member has been doing well.  State he has continued to have intermittent chest discomfort as well as shortness of breath.  Was seen by cardiology yesterday, plan for pacemake placement on 11/29.  She is unclear about reason for device, educated on member's history and the goal of management of A-fib/CHF after intervention.  She verbalizes understanding.  She does not drive often, encouraged to call son and daughter in law to secure transportation to hospital.  Blood pressure today was 129/85, HR 73, blood sugar 140.   She is unable to find his weight for today, he is currently not available.  Noted that neurology appointment for 11/3 was canceled, will assist with rescheduling after discharge home following pacer placement.   Patient Goals/Self-Care Activities: Patient will self administer medications as prescribed as evidenced by self report/primary caregiver report  Patient will attend all scheduled provider appointments as evidenced by clinician review of documented attendance to scheduled appointments and patient/caregiver report Patient will call pharmacy for medication refills as evidenced by patient report and review of pharmacy fill history as appropriate Patient will continue to perform ADL's independently as evidenced by patient/caregiver report   Update 12/8 - Noted that member was hospitalized twice in the last month, one for planned pacemaker implantation and for limb ischemia requiring vascular surgery.  Spoke with member and wife, they both report member is doing well since surgery.  Conference call placed vascular provider to request follow up appointment, they will call member/wife directly to schedule.  He already has appointment scheduled with PCP on 12/14 for labs and hospital follow up as well as tests scheduled on 12/16 for cancer scans.  Wife repeatedly state she is overwhelmed with managing member's care however is reluctant to ask son and daughter in law for help.  Encouraged to contact them and make them aware of upcoming appointments, advised to provide this care manager's contact information for discussion as well.   Update 12/14 - Wife report member is out driving today, went to MD office for labs, will return home to pick her up prior to PCP appointment.  She state he has been doing well, has not had any further pain.  Conference calls placed to Vascular and neurology office, appointments made for 1/6 at 1pm (vascular) and 1/19 at 145pm (neurology).  Reminded of CT scans scheduled for  Friday, 12/16.   Update 1/11 - Per wife, member doing better.  He is still going to work for a few hours several days a week.  Was seen by cardiology on 12/19, cardiac rehab was discussed.  Wife is unaware of this recommendation, state he has another appointment with their office today or tomorrow.  PCP appointment completed on 12/14, insulin dose was increased due to elevated blood sugars, per wife numbers are better, unable to find notebook with all readings but state it was 150 yesterday.  He will follo wup with PCP again on 1/27.  He attended appointment with oncology on 12/23, notified that PSA has increased again to 60, no signs of metastasis to bone.  Member does not wish to have any further treatment.  Follow up from vascular surgery completed on 1/6, no issues noted.  Reminded of upcoming appointment with neurology on 1/19, she will accompany him to this appointment.  Encouraged again to have son and daughter  in law more involved in care, she state they have been helping more with management of both of their conditions.    Update 2/8 - Member not available, current at work.  Was able to speak to wife.  Was seen by PCP a couple weeks ago, A1C remains elevated at 8.3.  Toujeo increased to 16 units a day and Rybelsus added to medication regime.  She report member's blood sugars have ranged from 140-190, will follow up with PCP on 2/24.  Member is not consistent with maintaining his diabetic diet, wife educated on proper foods to eat, will send education.  Blood pressure and heart rate have remained stable, yesterday was 122/84 and 81.  Neurology appointment scheduled for 1/19 was canceled, member/wife will call to reschedule.   Update 3/6 - Spoke with wife, state member is doing well, he is working today, unable to speak directly to member.  She report his blood sugars have ranged 110-140's at times but can still be elevated some days.  She is unable to find member's log today but will look for it to  confirm if values are stable.  Has appointments with oncology on 3/23, vascular on 4/7, podiatry on 4/10, and PCP on 4/21 (will have repeat A1C at that time).  Discussed need to reschedule neurology appointment, she prefer to wait to discuss with member to see if he wants to go.  If he does, she will call this care manager to assist with scheduling.    Update 4/4 - Wife available, member at work.  State she has tried to talk to him regarding his work schedule, encouraged him to retire completely but he refuses.  She report he has been doing well otherwise.  Blood sugar was 126 today and blood pressure was 116/76.  Weight has been stable.  He has not complained of any pain or discomfort.  Follow up with oncology was 3/23, both member and wife continue to decline further treatment for prostate cancer despite PSA being elevated at 83.98.  Reviewed upcoming appointments:  podiatry on 4/10, vascular changed to 4/14, and PCP on 4/21.   Update 5/1 - Wife report member is doing well. Confirms he attended all appointments since last outreach (podiatry, vascular, and primary).  A1C decreasing towards goal, report blood sugars have been trending better (90s-150s).  Weight has remained stable.  Blood pressure and heart rate are both well controlled.  Range of BP is 110s-120s/70s, HR in the 70s.  Wife state member is having intermittent nosebleeds, scheduled to follow up with ENT.     Update 6/7 - Spoke with wife, member not available at this time.  She feel he is doing "good."  He has not had any major concerns since last outreach.  Was seen last week by ENT, provided with nasal spray, only had 1 nosebleed since that was stopped quickly with medication.  BP has ranged 110-120s/70s, HR range 60-60s and blood sugar 120-150s.  Follow up appointments: 6/21 & 6/23 with cancer center, 10/17 with vascular, and 11/30 for [facility].  Discussed the need to follow up with cardiology and neurology, state she will ask member if he would  like to go.  Also discussed having daughter in law or son accompany them to help with questions and understanding, particularly with cancer center visit, she is hesitant stating they have to work but state she will ask.        Plan:  Telephone follow up appointment with care management team member scheduled for:  1 month The patient has been provided with contact information for the care management team and has been advised to call with any health related questions or concerns.   [name], RN, MSN, [place] Manager [phone]

## 2021-06-29 ENCOUNTER — Other Ambulatory Visit: Payer: Self-pay | Admitting: Nurse Practitioner

## 2021-06-29 DIAGNOSIS — I4891 Unspecified atrial fibrillation: Secondary | ICD-10-CM

## 2021-06-29 DIAGNOSIS — I10 Essential (primary) hypertension: Secondary | ICD-10-CM

## 2021-07-11 ENCOUNTER — Inpatient Hospital Stay: Payer: Medicare Other | Attending: Oncology

## 2021-07-11 DIAGNOSIS — Z79899 Other long term (current) drug therapy: Secondary | ICD-10-CM | POA: Diagnosis not present

## 2021-07-11 DIAGNOSIS — C61 Malignant neoplasm of prostate: Secondary | ICD-10-CM | POA: Insufficient documentation

## 2021-07-11 DIAGNOSIS — E1122 Type 2 diabetes mellitus with diabetic chronic kidney disease: Secondary | ICD-10-CM | POA: Diagnosis not present

## 2021-07-11 DIAGNOSIS — K5909 Other constipation: Secondary | ICD-10-CM | POA: Diagnosis not present

## 2021-07-11 DIAGNOSIS — N184 Chronic kidney disease, stage 4 (severe): Secondary | ICD-10-CM | POA: Insufficient documentation

## 2021-07-11 DIAGNOSIS — C772 Secondary and unspecified malignant neoplasm of intra-abdominal lymph nodes: Secondary | ICD-10-CM | POA: Insufficient documentation

## 2021-07-11 LAB — COMPREHENSIVE METABOLIC PANEL
ALT: 20 U/L (ref 0–44)
AST: 20 U/L (ref 15–41)
Albumin: 3.8 g/dL (ref 3.5–5.0)
Alkaline Phosphatase: 105 U/L (ref 38–126)
Anion gap: 7 (ref 5–15)
BUN: 33 mg/dL — ABNORMAL HIGH (ref 8–23)
CO2: 27 mmol/L (ref 22–32)
Calcium: 9.3 mg/dL (ref 8.9–10.3)
Chloride: 104 mmol/L (ref 98–111)
Creatinine, Ser: 2.16 mg/dL — ABNORMAL HIGH (ref 0.61–1.24)
GFR, Estimated: 29 mL/min — ABNORMAL LOW (ref >60)
Glucose, Bld: 64 mg/dL — ABNORMAL LOW (ref 70–99)
Potassium: 3.9 mmol/L (ref 3.5–5.1)
Sodium: 138 mmol/L (ref 135–145)
Total Bilirubin: 0.6 mg/dL (ref 0.3–1.2)
Total Protein: 7.8 g/dL (ref 6.5–8.1)

## 2021-07-11 LAB — CBC WITH DIFFERENTIAL/PLATELET
Abs Immature Granulocytes: 0.01 10*3/uL (ref 0.00–0.07)
Basophils Absolute: 0 10*3/uL (ref 0.0–0.1)
Basophils Relative: 0 %
Eosinophils Absolute: 0.1 10*3/uL (ref 0.0–0.5)
Eosinophils Relative: 2 %
HCT: 47.3 % (ref 39.0–52.0)
Hemoglobin: 14.8 g/dL (ref 13.0–17.0)
Immature Granulocytes: 0 %
Lymphocytes Relative: 30 %
Lymphs Abs: 1.7 10*3/uL (ref 0.7–4.0)
MCH: 29 pg (ref 26.0–34.0)
MCHC: 31.3 g/dL (ref 30.0–36.0)
MCV: 92.7 fL (ref 80.0–100.0)
Monocytes Absolute: 0.7 10*3/uL (ref 0.1–1.0)
Monocytes Relative: 13 %
Neutro Abs: 3 10*3/uL (ref 1.7–7.7)
Neutrophils Relative %: 55 %
Platelets: 184 10*3/uL (ref 150–400)
RBC: 5.1 MIL/uL (ref 4.22–5.81)
RDW: 15.7 % — ABNORMAL HIGH (ref 11.5–15.5)
WBC: 5.5 10*3/uL (ref 4.0–10.5)
nRBC: 0 % (ref 0.0–0.2)

## 2021-07-11 LAB — PSA: Prostatic Specific Antigen: 108.88 ng/mL — ABNORMAL HIGH (ref 0.00–4.00)

## 2021-07-13 ENCOUNTER — Telehealth: Payer: Self-pay

## 2021-07-13 ENCOUNTER — Inpatient Hospital Stay: Payer: Medicare Other

## 2021-07-13 ENCOUNTER — Inpatient Hospital Stay: Payer: Medicare Other | Admitting: Oncology

## 2021-07-17 ENCOUNTER — Inpatient Hospital Stay: Payer: Medicare Other

## 2021-07-17 ENCOUNTER — Encounter: Payer: Self-pay | Admitting: Oncology

## 2021-07-17 ENCOUNTER — Inpatient Hospital Stay (HOSPITAL_BASED_OUTPATIENT_CLINIC_OR_DEPARTMENT_OTHER): Payer: Medicare Other | Admitting: Oncology

## 2021-07-17 VITALS — BP 127/86 | HR 88 | Temp 96.4°F | Resp 18 | Wt 241.8 lb

## 2021-07-17 DIAGNOSIS — C61 Malignant neoplasm of prostate: Secondary | ICD-10-CM

## 2021-07-17 DIAGNOSIS — N184 Chronic kidney disease, stage 4 (severe): Secondary | ICD-10-CM

## 2021-07-17 DIAGNOSIS — Z79818 Long term (current) use of other agents affecting estrogen receptors and estrogen levels: Secondary | ICD-10-CM | POA: Diagnosis not present

## 2021-07-17 DIAGNOSIS — C772 Secondary and unspecified malignant neoplasm of intra-abdominal lymph nodes: Secondary | ICD-10-CM | POA: Diagnosis not present

## 2021-07-17 DIAGNOSIS — E1122 Type 2 diabetes mellitus with diabetic chronic kidney disease: Secondary | ICD-10-CM | POA: Diagnosis not present

## 2021-07-17 DIAGNOSIS — K5909 Other constipation: Secondary | ICD-10-CM

## 2021-07-17 DIAGNOSIS — Z79899 Other long term (current) drug therapy: Secondary | ICD-10-CM | POA: Diagnosis not present

## 2021-07-17 MED ORDER — LEUPROLIDE ACETATE (6 MONTH) 45 MG ~~LOC~~ KIT
45.0000 mg | PACK | Freq: Once | SUBCUTANEOUS | Status: AC
  Administered 2021-07-17: 45 mg via SUBCUTANEOUS
  Filled 2021-07-17: qty 45
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
## 2021-07-17 NOTE — Assessment & Plan Note (Signed)
Eligard 45 mg every 6 months.

## 2021-07-17 NOTE — Assessment & Plan Note (Signed)
Encourage oral hydration and avoid nephrotoxins.   

## 2021-07-19 ENCOUNTER — Other Ambulatory Visit: Payer: Self-pay | Admitting: *Deleted

## 2021-07-19 NOTE — Patient Outreach (Signed)
[name]) Care Management  07/19/2021  [name] [birth year] [id number]   Outgoing call placed to member/wife, unsuccessful, HIPAA compliant voice message left.  During last outreach, wife expressed no urgent concerns, member was actively monitoring blood pressure, heart rate, weight, and blood sugar.  Active with PCP and specialists.  Will close case at this time, no further needs identified at this time.  Care Plan : General Plan of Care (Adult)  Updates made by [name], RN since 07/19/2021 12:00 AM     Problem: Knowledge deficit regarding chronic disease management (DM, A-fib, CHF and prostate cancer) as evidenced by elevated A1C and PSA   Priority: High     Long-Range Goal: Member will show management of DM and prostate cancer evidenced by decreaesed A1C and PSA (goal extended as memer continues to work to achieve) Completed 07/19/2021  Start Date: 11/03/2020  Expected End Date: 11/03/2021  This Visit's Progress: On track  Recent Progress: On track  Priority: High  Note:   Current Barriers:  Knowledge Deficits related to plan of care for management of Atrial Fibrillation, CHF, DMII, and prostate cancer  Chronic Disease Management support and education needs related to Atrial Fibrillation, CHF, DMII, and prostate cancer  RNCM Clinical Goal(s):  Patient will verbalize understanding of plan for management of Atrial Fibrillation, CHF, DMII, and Gout as evidenced by member/wife reporting stability in health take all medications exactly as prescribed and will call provider for medication related questions as evidenced by reported adherence by wife/member    demonstrate understanding of rationale for each prescribed medication as evidenced by for cancer, CHF, A-fib and DM treatment.    attend all scheduled medical appointments: PCP, ONcology, and neurology as evidenced by PCP, ONcology, cardiology, and neurology        continue to work with [place]  and/or Social Worker to address care management and care coordination needs related to Atrial Fibrillation, CHF, DMII, and Gout as evidenced by adherence to CM Team Scheduled appointments     through collaboration with RN Care manager, provider, and care team.   Interventions: Inter-disciplinary care team collaboration (see longitudinal plan of care) Evaluation of current treatment plan related to  self management and patient's adherence to plan as established by provider   Diabetes:  (Status: Goal on Track (progressing): YES.) Lab Results  Component Value Date   HGBA1C 7.1 (A) 05/18/2021  Assessed patient's understanding of A1c goal: <7% Provided education to patient about basic DM disease process; Reviewed medications with patient and discussed importance of medication adherence;        Reviewed prescribed diet with patient low carb/sugar; Counseled on importance of regular laboratory monitoring as prescribed;        Advised patient, providing education and rationale, to check cbg daily and record         Oncology:  (Status: Goal on Track (progressing): YES.) Assessment of understanding of oncology diagnosis:  Assessed patient understanding of cancer diagnosis and recommended treatment plan, Assessed available transportation to appointments and treatments. Has consistent/reliable transportation: Yes, and Assessed support system. Has consistent/reliable family or other support: Yes  Heart Failure Interventions: Basic overview and discussion of pathophysiology of Heart Failure reviewed; Provided education on low sodium diet; Discussed importance of daily weight and advised patient to weigh and record daily; Reviewed role of diuretics in prevention of fluid overload and management of heart failure; Discussed the importance of keeping all appointments with provider;  AFIB Interventions:    Counseled on  increased risk of stroke due to Afib and benefits of anticoagulation for stroke  prevention; Reviewed importance of adherence to anticoagulant exactly as prescribed; Counseled on seeking medical attention after a head injury or if there is blood in the urine/stool; Afib action plan reviewed;   Update 11/11 - Per wife, member has been doing well.  State he has continued to have intermittent chest discomfort as well as shortness of breath.  Was seen by cardiology yesterday, plan for pacemake placement on 11/29.  She is unclear about reason for device, educated on member's history and the goal of management of A-fib/CHF after intervention.  She verbalizes understanding.  She does not drive often, encouraged to call son and daughter in law to secure transportation to hospital.  Blood pressure today was 129/85, HR 73, blood sugar 140.  She is unable to find his weight for today, he is currently not available.  Noted that neurology appointment for 11/3 was canceled, will assist with rescheduling after discharge home following pacer placement.   Patient Goals/Self-Care Activities: Patient will self administer medications as prescribed as evidenced by self report/primary caregiver report  Patient will attend all scheduled provider appointments as evidenced by clinician review of documented attendance to scheduled appointments and patient/caregiver report Patient will call pharmacy for medication refills as evidenced by patient report and review of pharmacy fill history as appropriate Patient will continue to perform ADL's independently as evidenced by patient/caregiver report   Update 12/8 - Noted that member was hospitalized twice in the last month, one for planned pacemaker implantation and for limb ischemia requiring vascular surgery.  Spoke with member and wife, they both report member is doing well since surgery.  Conference call placed vascular provider to request follow up appointment, they will call member/wife directly to schedule.  He already has appointment scheduled with PCP  on 12/14 for labs and hospital follow up as well as tests scheduled on 12/16 for cancer scans.  Wife repeatedly state she is overwhelmed with managing member's care however is reluctant to ask son and daughter in law for help.  Encouraged to contact them and make them aware of upcoming appointments, advised to provide this care manager's contact information for discussion as well.   Update 12/14 - Wife report member is out driving today, went to MD office for labs, will return home to pick her up prior to PCP appointment.  She state he has been doing well, has not had any further pain.  Conference calls placed to Vascular and neurology office, appointments made for 1/6 at 1pm (vascular) and 1/19 at 145pm (neurology).  Reminded of CT scans scheduled for Friday, 12/16.   Update 1/11 - Per wife, member doing better.  He is still going to work for a few hours several days a week.  Was seen by cardiology on 12/19, cardiac rehab was discussed.  Wife is unaware of this recommendation, state he has another appointment with their office today or tomorrow.  PCP appointment completed on 12/14, insulin dose was increased due to elevated blood sugars, per wife numbers are better, unable to find notebook with all readings but state it was 150 yesterday.  He will follo wup with PCP again on 1/27.  He attended appointment with oncology on 12/23, notified that PSA has increased again to 60, no signs of metastasis to bone.  Member does not wish to have any further treatment.  Follow up from vascular surgery completed on 1/6, no issues noted.  Reminded of upcoming appointment  with neurology on 1/19, she will accompany him to this appointment.  Encouraged again to have son and daughter in law more involved in care, she state they have been helping more with management of both of their conditions.    Update 2/8 - Member not available, current at work.  Was able to speak to wife.  Was seen by PCP a couple weeks ago, A1C remains  elevated at 8.3.  Toujeo increased to 16 units a day and Rybelsus added to medication regime.  She report member's blood sugars have ranged from 140-190, will follow up with PCP on 2/24.  Member is not consistent with maintaining his diabetic diet, wife educated on proper foods to eat, will send education.  Blood pressure and heart rate have remained stable, yesterday was 122/84 and 81.  Neurology appointment scheduled for 1/19 was canceled, member/wife will call to reschedule.   Update 3/6 - Spoke with wife, state member is doing well, he is working today, unable to speak directly to member.  She report his blood sugars have ranged 110-140's at times but can still be elevated some days.  She is unable to find member's log today but will look for it to confirm if values are stable.  Has appointments with oncology on 3/23, vascular on 4/7, podiatry on 4/10, and PCP on 4/21 (will have repeat A1C at that time).  Discussed need to reschedule neurology appointment, she prefer to wait to discuss with member to see if he wants to go.  If he does, she will call this care manager to assist with scheduling.    Update 4/4 - Wife available, member at work.  State she has tried to talk to him regarding his work schedule, encouraged him to retire completely but he refuses.  She report he has been doing well otherwise.  Blood sugar was 126 today and blood pressure was 116/76.  Weight has been stable.  He has not complained of any pain or discomfort.  Follow up with oncology was 3/23, both member and wife continue to decline further treatment for prostate cancer despite PSA being elevated at 83.98.  Reviewed upcoming appointments:  podiatry on 4/10, vascular changed to 4/14, and PCP on 4/21.   Update 5/1 - Wife report member is doing well. Confirms he attended all appointments since last outreach (podiatry, vascular, and primary).  A1C decreasing towards goal, report blood sugars have been trending better (90s-150s).   Weight has remained stable.  Blood pressure and heart rate are both well controlled.  Range of BP is 110s-120s/70s, HR in the 70s.  Wife state member is having intermittent nosebleeds, scheduled to follow up with ENT.     Update 6/7 - Spoke with wife, member not available at this time.  She feel he is doing "good."  He has not had any major concerns since last outreach.  Was seen last week by ENT, provided with nasal spray, only had 1 nosebleed since that was stopped quickly with medication.  BP has ranged 110-120s/70s, HR range 60-60s and blood sugar 120-150s.  Follow up appointments: 6/21 & 6/23 with cancer center, 10/17 with vascular, and 11/30 for AW[facility].  Discussed the need to follow up with cardiology and neurology, state she will ask member if he would like to go.  Also discussed having daughter in law or son accompany them to help with questions and understanding, particularly with cancer center visit, she is hesitant stating they have to work but state she will ask.  Update 6/29 - Member has not had any more hospitalizations or ED visits, has remained stable and active with providers.  Contact information left for wife/member, encouraged to call with concerns.      [name], RN, MSN, [place] Manager [phone]

## 2021-07-20 ENCOUNTER — Telehealth: Payer: Self-pay

## 2021-07-20 DIAGNOSIS — G4733 Obstructive sleep apnea (adult) (pediatric): Secondary | ICD-10-CM | POA: Diagnosis not present

## 2021-07-20 NOTE — Telephone Encounter (Signed)
Patient dropped off paperwork from dmv...given to [place].tat

## 2021-07-21 ENCOUNTER — Other Ambulatory Visit: Payer: Self-pay | Admitting: Nurse Practitioner

## 2021-07-21 DIAGNOSIS — I10 Essential (primary) hypertension: Secondary | ICD-10-CM

## 2021-07-23 ENCOUNTER — Telehealth: Payer: Self-pay

## 2021-07-23 NOTE — Telephone Encounter (Signed)
Paper for [facility] completed and faxed back to the [facility] at [phone] and patient picked up.

## 2021-07-23 NOTE — Telephone Encounter (Signed)
Patient called to get status on DMV paperwork. Told him Alyssa has to fill out. Someone will call him when done-[name]

## 2021-07-27 ENCOUNTER — Ambulatory Visit: Payer: Medicare Other | Admitting: *Deleted

## 2021-07-29 ENCOUNTER — Other Ambulatory Visit: Payer: Self-pay | Admitting: Nurse Practitioner

## 2021-07-29 DIAGNOSIS — I700 Atherosclerosis of aorta: Secondary | ICD-10-CM

## 2021-08-03 ENCOUNTER — Emergency Department
Admission: EM | Admit: 2021-08-03 | Discharge: 2021-08-04 | Disposition: A | Discharge status: Home / Self Care | Payer: Medicare Other | Attending: Emergency Medicine | Admitting: Emergency Medicine

## 2021-08-03 DIAGNOSIS — J449 Chronic obstructive pulmonary disease, unspecified: Secondary | ICD-10-CM | POA: Insufficient documentation

## 2021-08-03 DIAGNOSIS — I499 Cardiac arrhythmia, unspecified: Secondary | ICD-10-CM | POA: Diagnosis not present

## 2021-08-03 DIAGNOSIS — R079 Chest pain, unspecified: Secondary | ICD-10-CM

## 2021-08-03 DIAGNOSIS — N189 Chronic kidney disease, unspecified: Secondary | ICD-10-CM | POA: Diagnosis not present

## 2021-08-03 DIAGNOSIS — I130 Hypertensive heart and chronic kidney disease with heart failure and stage 1 through stage 4 chronic kidney disease, or unspecified chronic kidney disease: Secondary | ICD-10-CM | POA: Diagnosis not present

## 2021-08-03 DIAGNOSIS — Z743 Need for continuous supervision: Secondary | ICD-10-CM | POA: Diagnosis not present

## 2021-08-03 DIAGNOSIS — Z7901 Long term (current) use of anticoagulants: Secondary | ICD-10-CM | POA: Insufficient documentation

## 2021-08-03 DIAGNOSIS — I509 Heart failure, unspecified: Secondary | ICD-10-CM | POA: Diagnosis not present

## 2021-08-03 DIAGNOSIS — R778 Other specified abnormalities of plasma proteins: Secondary | ICD-10-CM | POA: Diagnosis not present

## 2021-08-03 DIAGNOSIS — R1012 Left upper quadrant pain: Secondary | ICD-10-CM | POA: Insufficient documentation

## 2021-08-03 DIAGNOSIS — R6889 Other general symptoms and signs: Secondary | ICD-10-CM | POA: Diagnosis not present

## 2021-08-03 DIAGNOSIS — R0789 Other chest pain: Secondary | ICD-10-CM | POA: Insufficient documentation

## 2021-08-03 DIAGNOSIS — E1122 Type 2 diabetes mellitus with diabetic chronic kidney disease: Secondary | ICD-10-CM | POA: Diagnosis not present

## 2021-08-03 LAB — BASIC METABOLIC PANEL
Anion gap: 5 (ref 5–15)
BUN: 27 mg/dL — ABNORMAL HIGH (ref 8–23)
CO2: 28 mmol/L (ref 22–32)
Calcium: 9.2 mg/dL (ref 8.9–10.3)
Chloride: 111 mmol/L (ref 98–111)
Creatinine, Ser: 2.19 mg/dL — ABNORMAL HIGH (ref 0.61–1.24)
GFR, Estimated: 29 mL/min — ABNORMAL LOW (ref >60)
Glucose, Bld: 122 mg/dL — ABNORMAL HIGH (ref 70–99)
Potassium: 3.9 mmol/L (ref 3.5–5.1)
Sodium: 144 mmol/L (ref 135–145)

## 2021-08-03 LAB — CBC WITH DIFFERENTIAL/PLATELET
Abs Immature Granulocytes: 0.01 10*3/uL (ref 0.00–0.07)
Basophils Absolute: 0 10*3/uL (ref 0.0–0.1)
Basophils Relative: 1 %
Eosinophils Absolute: 0.1 10*3/uL (ref 0.0–0.5)
Eosinophils Relative: 3 %
HCT: 46.1 % (ref 39.0–52.0)
Hemoglobin: 14 g/dL (ref 13.0–17.0)
Immature Granulocytes: 0 %
Lymphocytes Relative: 40 %
Lymphs Abs: 2.1 10*3/uL (ref 0.7–4.0)
MCH: 28.4 pg (ref 26.0–34.0)
MCHC: 30.4 g/dL (ref 30.0–36.0)
MCV: 93.5 fL (ref 80.0–100.0)
Monocytes Absolute: 0.8 10*3/uL (ref 0.1–1.0)
Monocytes Relative: 15 %
Neutro Abs: 2.1 10*3/uL (ref 1.7–7.7)
Neutrophils Relative %: 41 %
Platelets: 178 10*3/uL (ref 150–400)
RBC: 4.93 MIL/uL (ref 4.22–5.81)
RDW: 15.8 % — ABNORMAL HIGH (ref 11.5–15.5)
WBC: 5.2 10*3/uL (ref 4.0–10.5)
nRBC: 0 % (ref 0.0–0.2)

## 2021-08-03 LAB — TROPONIN I (HIGH SENSITIVITY): Troponin I (High Sensitivity): 21 ng/L — ABNORMAL HIGH (ref <18)

## 2021-08-03 NOTE — ED Triage Notes (Addendum)
Patient has a pacemaker, H/O CHF, Stroke, on elequis

## 2021-08-03 NOTE — ED Provider Notes (Signed)
[facility] Provider Note    Event Date/Time   First MD Initiated Contact with Patient 08/03/21 2301     (approximate)   History   Chief Complaint Chest Pain (Patient C/O sudden onset chest pain that started about an hour ago. EMS gave sublingual nitro and 50 mcg of Fentanyl. Patient also points to his left upper abdomen and when I press, it is tender. ) and Abdominal Pain   HPI  [name] is a 88 y.o. male with past medical history of hypertension, diabetes, CHF, atrial fibrillation on Eliquis, COPD, AAA, stroke, CKD, and DVT who presents to the ED complaining of chest pain.  Patient reports that he was sitting on his couch watching TV earlier this evening just after dinnertime, when he had sudden onset of sharp pain in the center of his chest.  Patient denies any associated shortness of breath and had been feeling well throughout the day with no recent fevers, cough, leg swelling or pain.  He states that since he has arrived to the ED, the pain in his chest has resolved and he is feeling well.  He denies any similar symptoms recently, states he has been compliant with Eliquis.      Physical Exam   Triage Vital Signs: ED Triage Vitals  Enc Vitals Group     BP 08/03/21 2255 (!) 143/89     Pulse Rate 08/03/21 2255 72     Resp 08/03/21 2255 16     Temp 08/03/21 2257 97.6 F (36.4 C)     Temp Source 08/03/21 2257 Oral     SpO2 08/03/21 2255 93 %     Weight 08/03/21 2257 252 lb 3.3 oz (114.4 kg)     Height 08/03/21 2257 6' (1.829 m)     Head Circumference --      Peak Flow --      Pain Score 08/03/21 2257 3     Pain Loc --      Pain Edu? --      Excl. in [place]? --     Most recent vital signs: Vitals:   08/04/21 0130 08/04/21 0200  BP: 133/87 123/82  Pulse: 70 69  Resp: 15 (!) 23  Temp:    SpO2: 96% 99%    Constitutional: Alert and oriented. Eyes: Conjunctivae are normal. Head: Atraumatic. Nose: No congestion/rhinnorhea. Mouth/Throat:  Mucous membranes are moist.  Cardiovascular: Normal rate, regular rhythm. Grossly normal heart sounds.  2+ radial pulses bilaterally. Respiratory: Normal respiratory effort.  No retractions. Lungs CTAB.  Anterior chest wall tenderness to palpation noted. Gastrointestinal: Soft and nontender. No distention. Musculoskeletal: No lower extremity tenderness nor edema.  Neurologic:  Normal speech and language. No gross focal neurologic deficits are appreciated.    ED Results / Procedures / Treatments   Labs (all labs ordered are listed, but only abnormal results are displayed) Labs Reviewed  CBC WITH DIFFERENTIAL/PLATELET - Abnormal; Notable for the following components:      Result Value   RDW 15.8 (*)    All other components within normal limits  BASIC METABOLIC PANEL - Abnormal; Notable for the following components:   Glucose, Bld 122 (*)    BUN 27 (*)    Creatinine, Ser 2.19 (*)    GFR, Estimated 29 (*)    All other components within normal limits  TROPONIN I (HIGH SENSITIVITY) - Abnormal; Notable for the following components:   Troponin I (High Sensitivity) 21 (*)    All other  components within normal limits  TROPONIN I (HIGH SENSITIVITY) - Abnormal; Notable for the following components:   Troponin I (High Sensitivity) 23 (*)    All other components within normal limits     EKG  ED ECG REPORT I, Blake Divine, the attending physician, personally viewed and interpreted this ECG.   Date: 08/03/2021  EKG Time: 22:57  Rate: 69  Rhythm: normal sinus rhythm  Axis: Normal  Intervals:nonspecific intraventricular conduction delay  ST&T Change: None  RADIOLOGY Chest x-ray reviewed and interpreted by me with no infiltrate, edema, or effusion.  PROCEDURES:  Critical Care performed: No  Procedures   MEDICATIONS ORDERED IN ED: Medications - No data to display   IMPRESSION / MDM / [place] / ED COURSE  I reviewed the triage vital signs and the nursing notes.                               88 y.o. male with past medical history of hypertension, diabetes, CHF, atrial fibrillation on Eliquis, COPD, AAA, stroke, CKD, and DVT who presents to the ED complaining of cute onset sharp pain in the center of his chest while watching TV this evening, which has since resolved.  Patient's presentation is most consistent with acute presentation with potential threat to life or bodily function.  Differential diagnosis includes, but is not limited to, ACS, PE, pneumonia, pneumothorax, dissection, GERD, musculoskeletal pain.  Patient well-appearing and in no acute distress, vital signs are unremarkable and he states all symptoms have resolved by the time of my evaluation.  EKG is similar to previous with nonspecific intraventricular conduction delay, no acute ischemic changes noted.  Chest x-ray is unremarkable and no symptoms to suggest pneumonia.  Initial troponin mildly elevated but similar to prior baseline and likely due to his known CKD.  Renal function comparable to previous with no significant electrolyte abnormality, anemia, or leukocytosis.  We will trend troponin, but if he remains asymptomatic and troponin stable, he would be appropriate for discharge home with cardiology follow-up.  Repeat troponin is stable, patient continues to deny any chest pain at this time, but does state that he has been having some discomfort when he goes to sit up straight.  Pain is reproducible with palpation and suspect musculoskeletal etiology.  He is appropriate for discharge home with PCP follow-up, was counseled to return to the ED for new or worsening symptoms.  Patient and family agree with plan.      FINAL CLINICAL IMPRESSION(S) / ED DIAGNOSES   Final diagnoses:  Chest pain, unspecified type     Rx / DC Orders   ED Discharge Orders     None        Note:  This document was prepared using Dragon voice recognition software and may include unintentional dictation  errors.   Blake Divine, MD 08/04/21 [phone]

## 2021-08-04 ENCOUNTER — Emergency Department: Payer: Medicare Other

## 2021-08-04 DIAGNOSIS — R079 Chest pain, unspecified: Secondary | ICD-10-CM | POA: Diagnosis not present

## 2021-08-04 LAB — TROPONIN I (HIGH SENSITIVITY): Troponin I (High Sensitivity): 23 ng/L — ABNORMAL HIGH (ref <18)

## 2021-08-07 ENCOUNTER — Encounter: Payer: Self-pay | Admitting: Internal Medicine

## 2021-08-07 ENCOUNTER — Ambulatory Visit (INDEPENDENT_AMBULATORY_CARE_PROVIDER_SITE_OTHER): Payer: Medicare Other | Admitting: Internal Medicine

## 2021-08-07 DIAGNOSIS — I10 Essential (primary) hypertension: Secondary | ICD-10-CM | POA: Diagnosis not present

## 2021-08-07 DIAGNOSIS — N1832 Chronic kidney disease, stage 3b: Secondary | ICD-10-CM | POA: Diagnosis not present

## 2021-08-07 DIAGNOSIS — R079 Chest pain, unspecified: Secondary | ICD-10-CM | POA: Diagnosis not present

## 2021-08-07 DIAGNOSIS — Z794 Long term (current) use of insulin: Secondary | ICD-10-CM

## 2021-08-07 DIAGNOSIS — E1122 Type 2 diabetes mellitus with diabetic chronic kidney disease: Secondary | ICD-10-CM

## 2021-08-07 DIAGNOSIS — I700 Atherosclerosis of aorta: Secondary | ICD-10-CM

## 2021-08-07 LAB — POCT GLYCOSYLATED HEMOGLOBIN (HGB A1C): Hemoglobin A1C: 6.8 % — AB (ref 4.0–5.6)

## 2021-08-07 MED ORDER — AMLODIPINE BESY-BENAZEPRIL HCL 5-10 MG PO CAPS: 1.0000 | ORAL_CAPSULE | Freq: Every day | ORAL | 3 refills | Status: DC

## 2021-08-07 NOTE — Progress Notes (Signed)
[facility] [place], [place] [address]  Internal MEDICINE  Office Visit Note  Patient Name: [name]  [id number]  [id number]  Date of Service: 08/07/2021  Chief Complaint  Patient presents with   Follow-up   Diabetes   Hypertension    HPI  [name] is a 88 y.o. male with past medical history of hypertension, diabetes, CHF, atrial fibrillation on Eliquis, COPD, AAA, stroke, CKD, and DVT who presented to the ED complaining of chest pain.this happened watching TV earlier after dinnertime, when he had sudden onset of sharp pain in the center of his chest.  Patient denies any associated shortness of breath and had been feeling well throughout the day with no recent fevers, cough, leg swelling or pain.  He states that since he has arrived to the ED, the pain in his chest has resolved and he is feeling well.  He denies any similar symptoms recently, states he has been compliant with Eliquis.he was d/c home    [place] pressure was found to be elevated, all labs and diagnostics were negative for any cardiopulmonary event, he was instructed to follow up here at PCP office.  Pt has elevated PSA ( [place]), followed by urology, recent medicine is very expensive and cannot afford it   Diabetes is under better control    Current Medication: Outpatient Encounter Medications as of 08/07/2021  Medication Sig Note   [name] (VENTOLIN HFA) 108 (90 Base) MCG/ACT inhaler Inhale 2 puffs into the lungs every 4 (four) hours as needed for wheezing or shortness of breath.    [name] (LOTREL) 5-10 MG capsule Take 1 capsule by mouth daily.    [name] (ELIQUIS) 5 MG TABS tablet Take 5 mg by mouth 2 (two) times daily.    [name] (LIPITOR) 10 MG tablet TAKE 1 TABLET(10 MG) BY MOUTH DAILY    [name] 10 MG TABS tablet TAKE 1 TABLET(10 MG) BY MOUTH DAILY BEFORE BREAKFAST    [name] 325 (65 FE) MG tablet Take 325 mg by mouth daily.     [name]  (ONETOUCH VERIO) test strip USE ONCE DAILY. DX E11.65.    [name] (LOPRESSOR) 25 MG tablet TAKE 1 TABLET(25 MG) BY MOUTH TWICE DAILY    [name] (PROTONIX) 40 MG tablet TAKE 1 TABLET(40 MG) BY MOUTH DAILY    [name] (RYBELSUS) 7 MG TABS Take 7 mg by mouth daily.    [name] (SPIRIVA RESPIMAT) 2.5 MCG/ACT AERS Inhale 2 puffs into the lungs daily.    [name] (CYANOCOBALAMIN) 1000 MCG tablet Take 1 tablet (1,000 mcg total) by mouth daily. 12/25/2020: Not on pt med list from pharmacy   [DISCONTINUED] [name] (NORVASC) 2.5 MG tablet TAKE 1 TABLET(2.5 MG) BY MOUTH DAILY    [DISCONTINUED] [name], 2 Unit Dial, (TOUJEO MAX SOLOSTAR) 300 UNIT/ML Solostar Pen Inject 10 Units into the skin daily.    No facility-administered encounter medications on file as of 08/07/2021.    Surgical History: Past Surgical History:  Procedure Laterality Date   CATARACT EXTRACTION     COLONOSCOPY WITH PROPOFOL N/A 07/02/2019   Procedure: COLONOSCOPY WITH PROPOFOL;  Surgeon: Lin Landsman, MD;  Location: [facility] ENDOSCOPY;  Service: Gastroenterology;  Laterality: N/A;   IR CT HEAD LTD  08/27/2019   IR PERCUTANEOUS ART THROMBECTOMY/INFUSION INTRACRANIAL INC DIAG ANGIO  08/27/2019       IR PERCUTANEOUS ART THROMBECTOMY/INFUSION INTRACRANIAL INC DIAG ANGIO  08/27/2019   LOWER EXTREMITY ANGIOGRAPHY Left 12/25/2020  Procedure: Lower Extremity Angiography;  Surgeon: Algernon Huxley, MD;  Location: [facility];  Service: Cardiovascular;  Laterality: Left;   PACEMAKER LEADLESS INSERTION N/A 12/19/2020   Procedure: PACEMAKER LEADLESS INSERTION;  Surgeon: Isaias Cowman, MD;  Location: [facility];  Service: Cardiovascular;  Laterality: N/A;   PROSTATE CANCER     PROSTATE SURGERY     RADIOLOGY WITH ANESTHESIA N/A 08/27/2019   Procedure: IR WITH ANESTHESIA;  Surgeon: Luanne Bras, MD;  Location: [facility];  Service: Radiology;  Laterality: N/A;     Medical History: Past Medical History:  Diagnosis Date   Atrial fibrillation (HCC)    CHF (congestive heart failure) ([place])    Diabetes ([place])    Hearing loss    High Werner pressure    History of bladder problems    Kidney stones 11/22/2014   Prostate cancer ([place])    Prostate cancer ([place])    Stroke ([place])     Family History: Family History  Problem Relation Age of Onset   Colon cancer Mother    Diabetes Other    High Werner pressure Other    Prostate cancer Brother    Diabetes Brother     Social History   Socioeconomic History   Marital status: Married    Spouse name: Emmitte Surgeon   Number of children: 2   Years of education: 12   Highest education level: 12th grade  Occupational History   Occupation: [employer]  Tobacco Use   Smoking status: Former    Years: 16.00    Types: Cigarettes   Smokeless tobacco: Never  Vaping Use   Vaping Use: Never used  Substance and Sexual Activity   Alcohol use: No   Drug use: No   Sexual activity: Not Currently  Other Topics Concern   Not on file  Social History Narrative   Not on file   Social Determinants of Health   Financial Resource Strain: Low Risk  (10/29/2019)   Overall Financial Resource Strain (CARDIA)    Difficulty of Paying Living Expenses: Not very hard  Recent Concern: Financial Resource Strain - Medium Risk (09/08/2019)   Overall Financial Resource Strain (CARDIA)    Difficulty of Paying Living Expenses: Somewhat hard  Food Insecurity: No Food Insecurity (10/29/2019)   Hunger Vital Sign    Worried About Running Out of Food in the Last Year: Never true    Ran Out of Food in the Last Year: Never true  Transportation Needs: No Transportation Needs (10/29/2019)   PRAPARE - Hydrologist (Medical): No    Lack of Transportation (Non-Medical): No  Physical Activity: Inactive (10/29/2019)   Exercise Vital Sign    Days of Exercise per Week: 0 days    Minutes of Exercise per Session: 0  min  Stress: No Stress Concern Present (10/29/2019)   [place]    Feeling of Stress : Only a little  Recent Concern: Stress - Stress Concern Present (09/08/2019)   [place]    Feeling of Stress : To some extent  Social Connections: Moderately Integrated (10/29/2019)   Social Connection and Isolation Panel [NHANES]    Frequency of Communication with Friends and Family: More than three times a week    Frequency of Social Gatherings with Friends and Family: More than three times a week    Attends Religious Services: More than 4 times per year  Active Member of Clubs or Organizations: No    Attends Archivist Meetings: Never    Marital Status: Married  Human resources officer Violence: Not At Risk (10/29/2019)   Humiliation, Afraid, Rape, and Kick questionnaire    Fear of Current or Ex-Partner: No    Emotionally Abused: No    Physically Abused: No    Sexually Abused: No      Review of Systems  Constitutional:  Negative for chills, fatigue and unexpected weight change.  HENT:  Negative for congestion, postnasal drip, rhinorrhea, sneezing and sore throat.   Eyes:  Negative for redness.  Respiratory:  Negative for cough, chest tightness and shortness of breath.   Cardiovascular:  Negative for chest pain and palpitations.  Gastrointestinal:  Negative for abdominal pain, constipation, diarrhea, nausea and vomiting.  Genitourinary:  Negative for dysuria and frequency.  Musculoskeletal:  Negative for arthralgias, back pain, joint swelling and neck pain.  Skin:  Negative for rash.  Neurological: Negative.  Negative for tremors and numbness.  Hematological:  Negative for adenopathy. Does not bruise/bleed easily.  Psychiatric/Behavioral:  Negative for behavioral problems (Depression), sleep disturbance and suicidal ideas. The patient is not nervous/anxious.      Vital Signs: Pulse 75   Temp 98.2 F (36.8 C)   Resp 16   Ht 6' (1.829 m)   Wt 243 lb 9.6 oz (110.5 kg)   SpO2 97%   BMI 33.04 kg/m    Physical Exam Constitutional:      Appearance: Normal appearance.  HENT:     Head: Normocephalic and atraumatic.     Nose: Nose normal.     Mouth/Throat:     Mouth: Mucous membranes are moist.     Pharynx: No posterior oropharyngeal erythema.  Eyes:     Extraocular Movements: Extraocular movements intact.     Pupils: Pupils are equal, round, and reactive to light.  Cardiovascular:     Rate and Rhythm: Normal rate.     Pulses: Normal pulses.     Heart sounds: Normal heart sounds.  Pulmonary:     Effort: Pulmonary effort is normal.     Breath sounds: Normal breath sounds.  Musculoskeletal:        General: Swelling present.  Neurological:     General: No focal deficit present.     Mental Status: He is alert.  Psychiatric:        Mood and Affect: Mood normal.        Behavior: Behavior normal.        Assessment/Plan: 1. Chest pain, unspecified type Chest pain of unknown etiology, cardiopulmonary testing negative, however pt is on Rybelsus, which can cause epigastric pain and Nausea, will monitor, pt Is instructed to consume small meals   2. Type 2 diabetes mellitus with stage 3b chronic kidney disease, with long-term current use of Glenn (HCC) Improved hg A1C (6.8) down from 7.1 - POCT HgB A1C - Microalbumin, urine  3. Uncontrolled hypertension DC Norvasc, start Lotrel for optimum BP control   4. Aortic atherosclerosis ([place]) Continue Lipitor as before    General Counseling: Loyalty verbalizes understanding of the findings of todays visit and agrees with plan of treatment. I have discussed any further diagnostic evaluation that may be needed or ordered today. We also reviewed his medications today. he has been encouraged to call the office with any questions or concerns that should arise related to todays  visit.    Orders Placed This Encounter  Procedures   Microalbumin, urine  POCT HgB A1C    Meds ordered this encounter  Medications   [name] (LOTREL) 5-10 MG capsule    Sig: Take 1 capsule by mouth daily.    Dispense:  90 capsule    Refill:  3    Total time spent:35 Minutes Time spent includes review of chart, medications, test results, and follow up plan with the patient.   [place] Controlled Substance Database was reviewed by me.   Dr Lavera Guise Internal medicine

## 2021-08-09 ENCOUNTER — Other Ambulatory Visit: Payer: Self-pay | Admitting: Internal Medicine

## 2021-08-09 LAB — MICROALBUMIN, URINE: Microalbumin, Urine: 8 ug/mL

## 2021-08-19 DIAGNOSIS — G4733 Obstructive sleep apnea (adult) (pediatric): Secondary | ICD-10-CM | POA: Diagnosis not present

## 2021-08-24 ENCOUNTER — Ambulatory Visit: Payer: Medicare Other | Admitting: Nurse Practitioner

## 2021-08-27 ENCOUNTER — Ambulatory Visit: Payer: Medicare Other | Admitting: Adult Health

## 2021-08-29 ENCOUNTER — Ambulatory Visit: Payer: Medicare Other | Admitting: Nurse Practitioner

## 2021-08-29 ENCOUNTER — Ambulatory Visit (INDEPENDENT_AMBULATORY_CARE_PROVIDER_SITE_OTHER): Payer: Medicare Other | Admitting: Nurse Practitioner

## 2021-08-29 ENCOUNTER — Encounter: Payer: Self-pay | Admitting: Nurse Practitioner

## 2021-08-29 VITALS — BP 128/77 | HR 83 | Temp 97.5°F | Resp 16 | Ht 72.0 in | Wt 245.0 lb

## 2021-08-29 DIAGNOSIS — R233 Spontaneous ecchymoses: Secondary | ICD-10-CM

## 2021-08-29 DIAGNOSIS — T148XXA Other injury of unspecified body region, initial encounter: Secondary | ICD-10-CM

## 2021-08-29 NOTE — Progress Notes (Signed)
[facility] [place], [place] [address]  Internal MEDICINE  Office Visit Note  Patient Name: [name]  [id number]  [id number]  Date of Service: 08/29/2021  Chief Complaint  Patient presents with   Arm Swelling    Having some swelling and irritation on right arm - possible bug bite      HPI [name] presents for an acute sick visit for painful bruise to right forearm.  --May have hit arm while he was sleeping, no break in skin, bug bite or sting site.  Some swelling but improved with ice.  -- Bruises easily, thin skin, on Eliquis for anticoagulation --wondering about other spots on arms, may be skin changes due to diabetes --Stable liver mass being folowed by Dr. Tasia Werner since 2018, repeat CT scan scheduled in September already.     Current Medication:  Outpatient Encounter Medications as of 08/29/2021  Medication Sig Note   albuterol (VENTOLIN HFA) 108 (90 Base) MCG/ACT inhaler Inhale 2 puffs into the lungs every 4 (four) hours as needed for wheezing or shortness of breath.    amLODipine-benazepril (LOTREL) 5-10 MG capsule Take 1 capsule by mouth daily.    apixaban (ELIQUIS) 5 MG TABS tablet Take 5 mg by mouth 2 (two) times daily.    atorvastatin (LIPITOR) 10 MG tablet TAKE 1 TABLET(10 MG) BY MOUTH DAILY    FARXIGA 10 MG TABS tablet TAKE 1 TABLET(10 MG) BY MOUTH DAILY BEFORE BREAKFAST    ferrous sulfate 325 (65 FE) MG tablet Take 325 mg by mouth daily.     glucose blood (ONETOUCH VERIO) test strip USE ONCE DAILY. DX E11.65.    metoprolol tartrate (LOPRESSOR) 25 MG tablet TAKE 1 TABLET(25 MG) BY MOUTH TWICE DAILY    pantoprazole (PROTONIX) 40 MG tablet TAKE 1 TABLET(40 MG) BY MOUTH DAILY    RYBELSUS 7 MG TABS TAKE 1 TABLET BY MOUTH DAILY    Tiotropium Bromide Monohydrate (SPIRIVA RESPIMAT) 2.5 MCG/ACT AERS Inhale 2 puffs into the lungs daily.    vitamin B-12 (CYANOCOBALAMIN) 1000 MCG tablet Take 1 tablet (1,000 mcg total) by mouth daily. 12/25/2020: Not  on pt med list from pharmacy   No facility-administered encounter medications on file as of 08/29/2021.      Medical History: Past Medical History:  Diagnosis Date   Atrial fibrillation ([place])    CHF (congestive heart failure) ([place])    Diabetes ([place])    Hearing loss    High blood pressure    History of bladder problems    Kidney stones 11/22/2014   Prostate cancer ([place])    Prostate cancer ([place])    Stroke ([place])      Vital Signs: BP 128/77   Pulse 83   Temp (!) 97.5 F (36.4 C)   Resp 16   Ht 6' (1.829 m)   Wt 245 lb (111.1 kg)   SpO2 98%   BMI 33.23 kg/m    Review of Systems  Constitutional: Negative.   Respiratory: Negative.  Negative for cough, chest tightness, shortness of breath and wheezing.   Cardiovascular: Negative.  Negative for chest pain and palpitations.  Gastrointestinal: Negative.   Musculoskeletal: Negative.   Skin:  Positive for color change (Bruising to right lateral forearm).  Hematological:  Bruises/bleeds easily (On Eliquis).    Physical Exam Constitutional:      General: He is not in acute distress.    Appearance: Normal appearance. He is obese. He is not ill-appearing.  HENT:  Head: Normocephalic and atraumatic.  Eyes:     Pupils: Pupils are equal, round, and reactive to light.  Cardiovascular:     Rate and Rhythm: Normal rate and regular rhythm.  Pulmonary:     Effort: Pulmonary effort is normal. No respiratory distress.  Skin:    General: Skin is warm and dry.     Findings: Bruising (Right lateral forearm) present.     Comments: Right lateral forearm bruise: No break in skin noted, no erythema, no swelling, and no drainage noted.  Bruised area is tender to touch but not hot to touch, no significant signs of any infection.  Neurological:     Mental Status: He is alert and oriented to person, place, and time.  Psychiatric:        Mood and Affect: Mood normal.        Behavior: Behavior normal.       Assessment/Plan: 1. Easy  bruising Thinning of skin due to advanced age and anticoagulation therapy with Eliquis increases risk of bruising and bleeding  2. Bruise Right lateral forearm bruise is superficial, tender.  Swelling is almost completely resolved, patient encouraged to continue to ice the area if it helps with pain.  Patient also instructed to try over-the-counter topical diclofenac gel that can be applied up to 4 times daily.  Patient may also take Tylenol up to 1000 mg twice daily over-the-counter to help alleviate pain.  Patient instructed not to take ibuprofen or any medications in the NSAID family due to increased risk of bleeding while he is on anticoagulants.   General Counseling: [name] understanding of the findings of todays visit and agrees with plan of treatment. I have discussed any further diagnostic evaluation that may be needed or ordered today. We also reviewed his medications today. he has been encouraged to call the office with any questions or concerns that should arise related to todays visit.    Counseling:    No orders of the defined types were placed in this encounter.   No orders of the defined types were placed in this encounter.   Return if symptoms worsen or fail to improve, for please cancel the appt on 08/31/21, will do a1c at appt on 12/20/21.  [place] Controlled Substance Database was reviewed by me for overdose risk score (ORS)  Time spent:20, minutes Time spent with patient included reviewing progress notes, labs, imaging studies, and discussing plan for follow up.   This patient was seen by Glenn Osgood, FNP-C in collaboration with Dr. Clayborn Werner as a part of collaborative care agreement.  [name] R. Glenn Fuller, MSN, FNP-C Internal Medicine

## 2021-08-31 ENCOUNTER — Ambulatory Visit: Payer: Medicare Other | Admitting: Nurse Practitioner

## 2021-09-05 ENCOUNTER — Ambulatory Visit: Payer: Medicare Other | Admitting: Adult Health

## 2021-09-19 DIAGNOSIS — I10 Essential (primary) hypertension: Secondary | ICD-10-CM | POA: Diagnosis not present

## 2021-09-19 DIAGNOSIS — G4733 Obstructive sleep apnea (adult) (pediatric): Secondary | ICD-10-CM | POA: Diagnosis not present

## 2021-09-19 DIAGNOSIS — I38 Endocarditis, valve unspecified: Secondary | ICD-10-CM | POA: Diagnosis not present

## 2021-09-19 DIAGNOSIS — I482 Chronic atrial fibrillation, unspecified: Secondary | ICD-10-CM | POA: Diagnosis not present

## 2021-09-19 DIAGNOSIS — E782 Mixed hyperlipidemia: Secondary | ICD-10-CM | POA: Diagnosis not present

## 2021-09-19 DIAGNOSIS — I5022 Chronic systolic (congestive) heart failure: Secondary | ICD-10-CM | POA: Diagnosis not present

## 2021-09-25 ENCOUNTER — Encounter: Payer: Self-pay | Admitting: Adult Health

## 2021-09-25 ENCOUNTER — Ambulatory Visit: Payer: Medicare Other | Admitting: Adult Health

## 2021-09-25 VITALS — BP 110/66 | HR 74 | Ht 72.0 in | Wt 240.0 lb

## 2021-09-25 DIAGNOSIS — I639 Cerebral infarction, unspecified: Secondary | ICD-10-CM | POA: Diagnosis not present

## 2021-09-25 DIAGNOSIS — I4811 Longstanding persistent atrial fibrillation: Secondary | ICD-10-CM | POA: Diagnosis not present

## 2021-09-25 NOTE — Patient Instructions (Addendum)
It was good to see you again today! As you have been doing well from a stroke standpoint, you can follow up with [place] as needed at this time. Please do not hesitate to call with any stroke related questions or concerns.    Recommendations:  Cardiology will follow up regarding Eliquis dosage but appears you should be taking 2.'5mg'$  twice daily - if you do not hear from them by mid afternoon tomorrow, please reach out to their office to further discuss Continue atorvastatin as advised   Continue to follow with cardiology for atrial fibrillation and Eliquis management/monitoring  Continue to follow up with PCP regarding blood pressure, cholesterol and diabetes management  Maintain strict control of hypertension with blood pressure goal below 130/90, diabetes with hemoglobin A1c goal below 7.0 % and cholesterol with LDL cholesterol (bad cholesterol) goal below 70 mg/dL.   Signs of a Stroke? Follow the BEFAST method:  Balance Watch for a sudden loss of balance, trouble with coordination or vertigo Eyes Is there a sudden loss of vision in one or both eyes? Or double vision?  Face: Ask the person to smile. Does one side of the face droop or is it numb?  Arms: Ask the person to raise both arms. Does one arm drift downward? Is there weakness or numbness of a leg? Speech: Ask the person to repeat a simple phrase. Does the speech sound slurred/strange? Is the person confused ? Time: If you observe any of these signs, call 911.        Thank you for coming to see [place] at [facility]. I hope we have been able to provide you high quality care today.  You may receive a patient satisfaction survey over the next few weeks. We would appreciate your feedback and comments so that we may continue to improve ourselves and the health of our patients.
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
## 2021-09-27 ENCOUNTER — Other Ambulatory Visit: Payer: Self-pay | Admitting: Nurse Practitioner

## 2021-09-27 ENCOUNTER — Other Ambulatory Visit: Payer: Self-pay | Admitting: Physician Assistant

## 2021-09-27 DIAGNOSIS — K219 Gastro-esophageal reflux disease without esophagitis: Secondary | ICD-10-CM

## 2021-09-27 DIAGNOSIS — E1122 Type 2 diabetes mellitus with diabetic chronic kidney disease: Secondary | ICD-10-CM

## 2021-10-03 ENCOUNTER — Emergency Department: Payer: Medicare Other

## 2021-10-03 ENCOUNTER — Inpatient Hospital Stay
Admission: EM | Admit: 2021-10-03 | Discharge: 2021-10-06 | DRG: 683 | Disposition: A | Discharge status: Home / Self Care | Payer: Medicare Other | Attending: Internal Medicine | Admitting: Internal Medicine

## 2021-10-03 DIAGNOSIS — R079 Chest pain, unspecified: Secondary | ICD-10-CM | POA: Diagnosis not present

## 2021-10-03 DIAGNOSIS — I130 Hypertensive heart and chronic kidney disease with heart failure and stage 1 through stage 4 chronic kidney disease, or unspecified chronic kidney disease: Secondary | ICD-10-CM | POA: Diagnosis not present

## 2021-10-03 DIAGNOSIS — E669 Obesity, unspecified: Secondary | ICD-10-CM

## 2021-10-03 DIAGNOSIS — R590 Localized enlarged lymph nodes: Secondary | ICD-10-CM | POA: Diagnosis present

## 2021-10-03 DIAGNOSIS — Z833 Family history of diabetes mellitus: Secondary | ICD-10-CM | POA: Diagnosis not present

## 2021-10-03 DIAGNOSIS — I495 Sick sinus syndrome: Secondary | ICD-10-CM | POA: Diagnosis present

## 2021-10-03 DIAGNOSIS — Z79899 Other long term (current) drug therapy: Secondary | ICD-10-CM | POA: Diagnosis not present

## 2021-10-03 DIAGNOSIS — Z800 Family history of malignant neoplasm of digestive organs: Secondary | ICD-10-CM

## 2021-10-03 DIAGNOSIS — I480 Paroxysmal atrial fibrillation: Secondary | ICD-10-CM | POA: Diagnosis present

## 2021-10-03 DIAGNOSIS — Z7984 Long term (current) use of oral hypoglycemic drugs: Secondary | ICD-10-CM

## 2021-10-03 DIAGNOSIS — Z7901 Long term (current) use of anticoagulants: Secondary | ICD-10-CM | POA: Diagnosis not present

## 2021-10-03 DIAGNOSIS — I714 Abdominal aortic aneurysm, without rupture, unspecified: Secondary | ICD-10-CM | POA: Diagnosis present

## 2021-10-03 DIAGNOSIS — Z8673 Personal history of transient ischemic attack (TIA), and cerebral infarction without residual deficits: Secondary | ICD-10-CM

## 2021-10-03 DIAGNOSIS — E785 Hyperlipidemia, unspecified: Secondary | ICD-10-CM | POA: Diagnosis present

## 2021-10-03 DIAGNOSIS — N400 Enlarged prostate without lower urinary tract symptoms: Secondary | ICD-10-CM | POA: Diagnosis present

## 2021-10-03 DIAGNOSIS — Z794 Long term (current) use of insulin: Secondary | ICD-10-CM | POA: Diagnosis not present

## 2021-10-03 DIAGNOSIS — C61 Malignant neoplasm of prostate: Secondary | ICD-10-CM | POA: Diagnosis not present

## 2021-10-03 DIAGNOSIS — E1122 Type 2 diabetes mellitus with diabetic chronic kidney disease: Secondary | ICD-10-CM | POA: Diagnosis present

## 2021-10-03 DIAGNOSIS — Z8042 Family history of malignant neoplasm of prostate: Secondary | ICD-10-CM

## 2021-10-03 DIAGNOSIS — E1129 Type 2 diabetes mellitus with other diabetic kidney complication: Secondary | ICD-10-CM | POA: Diagnosis present

## 2021-10-03 DIAGNOSIS — N179 Acute kidney failure, unspecified: Secondary | ICD-10-CM | POA: Diagnosis not present

## 2021-10-03 DIAGNOSIS — Z8546 Personal history of malignant neoplasm of prostate: Secondary | ICD-10-CM

## 2021-10-03 DIAGNOSIS — I7143 Infrarenal abdominal aortic aneurysm, without rupture: Secondary | ICD-10-CM | POA: Diagnosis not present

## 2021-10-03 DIAGNOSIS — N1831 Chronic kidney disease, stage 3a: Secondary | ICD-10-CM

## 2021-10-03 DIAGNOSIS — I482 Chronic atrial fibrillation, unspecified: Secondary | ICD-10-CM | POA: Diagnosis present

## 2021-10-03 DIAGNOSIS — Z87891 Personal history of nicotine dependence: Secondary | ICD-10-CM

## 2021-10-03 DIAGNOSIS — I5032 Chronic diastolic (congestive) heart failure: Secondary | ICD-10-CM | POA: Diagnosis not present

## 2021-10-03 DIAGNOSIS — I472 Ventricular tachycardia, unspecified: Secondary | ICD-10-CM | POA: Diagnosis present

## 2021-10-03 DIAGNOSIS — Z6832 Body mass index (BMI) 32.0-32.9, adult: Secondary | ICD-10-CM

## 2021-10-03 DIAGNOSIS — Z7951 Long term (current) use of inhaled steroids: Secondary | ICD-10-CM

## 2021-10-03 DIAGNOSIS — I10 Essential (primary) hypertension: Secondary | ICD-10-CM | POA: Diagnosis not present

## 2021-10-03 DIAGNOSIS — Z86718 Personal history of other venous thrombosis and embolism: Secondary | ICD-10-CM

## 2021-10-03 DIAGNOSIS — N184 Chronic kidney disease, stage 4 (severe): Secondary | ICD-10-CM | POA: Diagnosis not present

## 2021-10-03 DIAGNOSIS — N189 Chronic kidney disease, unspecified: Secondary | ICD-10-CM | POA: Diagnosis not present

## 2021-10-03 DIAGNOSIS — I4729 Other ventricular tachycardia: Secondary | ICD-10-CM | POA: Diagnosis not present

## 2021-10-03 DIAGNOSIS — R0789 Other chest pain: Secondary | ICD-10-CM | POA: Diagnosis not present

## 2021-10-03 DIAGNOSIS — N200 Calculus of kidney: Secondary | ICD-10-CM | POA: Diagnosis not present

## 2021-10-03 LAB — CBC
HCT: 46.5 % (ref 39.0–52.0)
Hemoglobin: 14.4 g/dL (ref 13.0–17.0)
MCH: 28.6 pg (ref 26.0–34.0)
MCHC: 31 g/dL (ref 30.0–36.0)
MCV: 92.4 fL (ref 80.0–100.0)
Platelets: 177 10*3/uL (ref 150–400)
RBC: 5.03 MIL/uL (ref 4.22–5.81)
RDW: 15 % (ref 11.5–15.5)
WBC: 6.4 10*3/uL (ref 4.0–10.5)
nRBC: 0 % (ref 0.0–0.2)

## 2021-10-03 LAB — URINALYSIS, COMPLETE (UACMP) WITH MICROSCOPIC
Bilirubin Urine: NEGATIVE
Glucose, UA: 150 mg/dL — AB
Hgb urine dipstick: NEGATIVE
Ketones, ur: NEGATIVE mg/dL
Leukocytes,Ua: NEGATIVE
Nitrite: NEGATIVE
Protein, ur: NEGATIVE mg/dL
Specific Gravity, Urine: 1.01 (ref 1.005–1.030)
pH: 5 (ref 5.0–8.0)

## 2021-10-03 LAB — BASIC METABOLIC PANEL
Anion gap: 10 (ref 5–15)
BUN: 64 mg/dL — ABNORMAL HIGH (ref 8–23)
CO2: 26 mmol/L (ref 22–32)
Calcium: 9.3 mg/dL (ref 8.9–10.3)
Chloride: 105 mmol/L (ref 98–111)
Creatinine, Ser: 4.06 mg/dL — ABNORMAL HIGH (ref 0.61–1.24)
GFR, Estimated: 14 mL/min — ABNORMAL LOW (ref >60)
Glucose, Bld: 128 mg/dL — ABNORMAL HIGH (ref 70–99)
Potassium: 4.6 mmol/L (ref 3.5–5.1)
Sodium: 141 mmol/L (ref 135–145)

## 2021-10-03 LAB — BRAIN NATRIURETIC PEPTIDE: B Natriuretic Peptide: 144.8 pg/mL — ABNORMAL HIGH (ref 0.0–100.0)

## 2021-10-03 LAB — TROPONIN I (HIGH SENSITIVITY)
Troponin I (High Sensitivity): 15 ng/L (ref <18)
Troponin I (High Sensitivity): 15 ng/L (ref <18)

## 2021-10-03 MED ORDER — SODIUM CHLORIDE 0.9 % IV BOLUS
500.0000 mL | Freq: Once | INTRAVENOUS | Status: AC
  Administered 2021-10-03: 500 mL via INTRAVENOUS

## 2021-10-03 NOTE — ED Notes (Signed)
ED Provider at bedside. 

## 2021-10-03 NOTE — ED Notes (Signed)
Patient transported to CT 

## 2021-10-03 NOTE — ED Provider Notes (Signed)
[facility] Provider Note    Event Date/Time   First MD Initiated Contact with Patient 10/03/21 2108     (approximate)   History   Chest Pain   HPI  [name] is a 88 y.o. male   Past medical history of history of atrial fibrillation on Eliquis, pacemaker, prostate cancer, CHF, kidney stones presents with chest discomfort for the past several days.  Denies shortness of breath or fever.  No cough.  He denies urinary symptoms, diarrhea or blood in the stools.  No trauma.  He states that his chest pain is worse when leaning forward, nonexertional, nonradiating.  He is uncertain of his past medical history and medications.  History was obtained via patient and his wife at bedside.      Physical Exam   Triage Vital Signs: ED Triage Vitals [10/03/21 2059]  Enc Vitals Group     BP 99/66     Pulse Rate 79     Resp 16     Temp 98.4 F (36.9 C)     Temp Source Oral     SpO2 95 %     Weight 240 lb 4.8 oz (109 kg)     Height 6' (1.829 m)     Head Circumference      Peak Flow      Pain Score      Pain Loc      Pain Edu?      Excl. in [place]?     Most recent vital signs: Vitals:   10/03/21 2330 10/04/21 0000  BP: 117/69 114/83  Pulse: 68 69  Resp: 17 16  Temp:    SpO2: 97% 98%    General: Awake, no distress.  CV:  Good peripheral perfusion.  Resp:  Normal effort.  No obvious rales, focalities, wheezing Abd:  No distention.  Mild tenderness to palpation diffusely.   ED Results / Procedures / Treatments   Labs (all labs ordered are listed, but only abnormal results are displayed) Labs Reviewed  BASIC METABOLIC PANEL - Abnormal; Notable for the following components:      Result Value   Glucose, Bld 128 (*)    BUN 64 (*)    Creatinine, Ser 4.06 (*)    GFR, Estimated 14 (*)    All other components within normal limits  BRAIN NATRIURETIC PEPTIDE - Abnormal; Notable for the following components:   B Natriuretic Peptide 144.8 (*)     All other components within normal limits  URINALYSIS, COMPLETE (UACMP) WITH MICROSCOPIC - Abnormal; Notable for the following components:   Color, Urine STRAW (*)    APPearance CLEAR (*)    Glucose, UA 150 (*)    Bacteria, UA RARE (*)    All other components within normal limits  CBC  TROPONIN I (HIGH SENSITIVITY)  TROPONIN I (HIGH SENSITIVITY)     I reviewed labs and they are notable for Cr 4.06 from baseline 2s  EKG  ED ECG REPORT I, Lucillie Garfinkel, the attending physician, personally viewed and interpreted this ECG.   Date: 10/03/2021  EKG Time: 2257  Rate: 69  Rhythm: normal EKG, normal sinus rhythm, unchanged from previous tracings, frequent PVC's noted  Intervals:nonspecific intraventricular conduction delay  ST&T Change: no stemi    RADIOLOGY I independently reviewed and interpreted CXR and see cardiomegaly no focalities   PROCEDURES:  Critical Care performed: No  Procedures   MEDICATIONS ORDERED IN ED: Medications  sodium chloride 0.9 % bolus 500  mL (500 mLs Intravenous New Bag/Given 10/03/21 2328)     IMPRESSION / MDM / ASSESSMENT AND PLAN / ED COURSE  I reviewed the triage vital signs and the nursing notes.                              Differential diagnosis includes, but is not limited to, ACS, pna, MSK, intraabdominal infection, kidney stone, considered less likely dissection, pe    The patient is on the cardiac monitor to evaluate for evidence of arrhythmia and/or significant heart rate changes.  MDM:  Cr 4 from baseline 2 and normal trops initially. CT c/a/p to assess intraabdominal infx/stone/vascular pathology. Broad differential for chest pain and AKI. Signed out pending further w/u.    Patient's presentation is most consistent with acute presentation with potential threat to life or bodily function.       FINAL CLINICAL IMPRESSION(S) / ED DIAGNOSES   Final diagnoses:  None     Rx / DC Orders   ED Discharge Orders     None         Note:  This document was prepared using Dragon voice recognition software and may include unintentional dictation errors.    Lucillie Garfinkel, MD 10/04/21 [id number]

## 2021-10-03 NOTE — ED Triage Notes (Signed)
Pt with c/o substernal chest pain that began after lunch today post pt continuously bending over picking "stuff" up. Pt sts chest pain has increased tonight. Pt denies SOB. Hx/o chronic Afib, CHF, pacemaker, DM, CVA and DVT. Pt takes Eliquis '5mg'$  twice daily.

## 2021-10-04 ENCOUNTER — Encounter: Payer: Self-pay | Admitting: Internal Medicine

## 2021-10-04 ENCOUNTER — Other Ambulatory Visit: Payer: Self-pay

## 2021-10-04 DIAGNOSIS — N189 Chronic kidney disease, unspecified: Secondary | ICD-10-CM | POA: Diagnosis not present

## 2021-10-04 DIAGNOSIS — E785 Hyperlipidemia, unspecified: Secondary | ICD-10-CM | POA: Diagnosis present

## 2021-10-04 DIAGNOSIS — I130 Hypertensive heart and chronic kidney disease with heart failure and stage 1 through stage 4 chronic kidney disease, or unspecified chronic kidney disease: Secondary | ICD-10-CM | POA: Diagnosis present

## 2021-10-04 DIAGNOSIS — N179 Acute kidney failure, unspecified: Secondary | ICD-10-CM | POA: Diagnosis not present

## 2021-10-04 DIAGNOSIS — E1122 Type 2 diabetes mellitus with diabetic chronic kidney disease: Secondary | ICD-10-CM | POA: Diagnosis not present

## 2021-10-04 DIAGNOSIS — I482 Chronic atrial fibrillation, unspecified: Secondary | ICD-10-CM | POA: Diagnosis not present

## 2021-10-04 DIAGNOSIS — I714 Abdominal aortic aneurysm, without rupture, unspecified: Secondary | ICD-10-CM | POA: Diagnosis not present

## 2021-10-04 DIAGNOSIS — Z8546 Personal history of malignant neoplasm of prostate: Secondary | ICD-10-CM | POA: Diagnosis not present

## 2021-10-04 DIAGNOSIS — Z6832 Body mass index (BMI) 32.0-32.9, adult: Secondary | ICD-10-CM | POA: Diagnosis not present

## 2021-10-04 DIAGNOSIS — N184 Chronic kidney disease, stage 4 (severe): Secondary | ICD-10-CM | POA: Diagnosis not present

## 2021-10-04 DIAGNOSIS — Z7901 Long term (current) use of anticoagulants: Secondary | ICD-10-CM | POA: Diagnosis not present

## 2021-10-04 DIAGNOSIS — I10 Essential (primary) hypertension: Secondary | ICD-10-CM | POA: Diagnosis not present

## 2021-10-04 DIAGNOSIS — I5032 Chronic diastolic (congestive) heart failure: Secondary | ICD-10-CM | POA: Diagnosis present

## 2021-10-04 DIAGNOSIS — Z8042 Family history of malignant neoplasm of prostate: Secondary | ICD-10-CM | POA: Diagnosis not present

## 2021-10-04 DIAGNOSIS — N400 Enlarged prostate without lower urinary tract symptoms: Secondary | ICD-10-CM | POA: Diagnosis present

## 2021-10-04 DIAGNOSIS — Z79899 Other long term (current) drug therapy: Secondary | ICD-10-CM | POA: Diagnosis not present

## 2021-10-04 DIAGNOSIS — R0789 Other chest pain: Secondary | ICD-10-CM | POA: Diagnosis not present

## 2021-10-04 DIAGNOSIS — Z8673 Personal history of transient ischemic attack (TIA), and cerebral infarction without residual deficits: Secondary | ICD-10-CM | POA: Diagnosis not present

## 2021-10-04 DIAGNOSIS — Z794 Long term (current) use of insulin: Secondary | ICD-10-CM | POA: Diagnosis not present

## 2021-10-04 DIAGNOSIS — E669 Obesity, unspecified: Secondary | ICD-10-CM | POA: Diagnosis present

## 2021-10-04 DIAGNOSIS — I495 Sick sinus syndrome: Secondary | ICD-10-CM | POA: Diagnosis not present

## 2021-10-04 DIAGNOSIS — I472 Ventricular tachycardia, unspecified: Secondary | ICD-10-CM | POA: Diagnosis present

## 2021-10-04 DIAGNOSIS — Z7984 Long term (current) use of oral hypoglycemic drugs: Secondary | ICD-10-CM | POA: Diagnosis not present

## 2021-10-04 DIAGNOSIS — R079 Chest pain, unspecified: Secondary | ICD-10-CM | POA: Diagnosis not present

## 2021-10-04 DIAGNOSIS — Z86718 Personal history of other venous thrombosis and embolism: Secondary | ICD-10-CM | POA: Diagnosis not present

## 2021-10-04 DIAGNOSIS — Z800 Family history of malignant neoplasm of digestive organs: Secondary | ICD-10-CM | POA: Diagnosis not present

## 2021-10-04 DIAGNOSIS — C61 Malignant neoplasm of prostate: Secondary | ICD-10-CM | POA: Diagnosis not present

## 2021-10-04 DIAGNOSIS — I7143 Infrarenal abdominal aortic aneurysm, without rupture: Secondary | ICD-10-CM | POA: Diagnosis present

## 2021-10-04 DIAGNOSIS — Z87891 Personal history of nicotine dependence: Secondary | ICD-10-CM | POA: Diagnosis not present

## 2021-10-04 DIAGNOSIS — I4729 Other ventricular tachycardia: Secondary | ICD-10-CM | POA: Diagnosis not present

## 2021-10-04 DIAGNOSIS — Z833 Family history of diabetes mellitus: Secondary | ICD-10-CM | POA: Diagnosis not present

## 2021-10-04 LAB — GLUCOSE, CAPILLARY
Glucose-Capillary: 99 mg/dL (ref 70–99)
Glucose-Capillary: 92 mg/dL (ref 70–99)
Glucose-Capillary: 115 mg/dL — ABNORMAL HIGH (ref 70–99)

## 2021-10-04 LAB — CBG MONITORING, ED: Glucose-Capillary: 95 mg/dL (ref 70–99)

## 2021-10-04 LAB — MAGNESIUM: Magnesium: 1.9 mg/dL (ref 1.7–2.4)

## 2021-10-04 MED ORDER — PANTOPRAZOLE SODIUM 40 MG PO TBEC
40.0000 mg | DELAYED_RELEASE_TABLET | Freq: Every day | ORAL | Status: DC
Start: 2021-10-04 — End: 2021-10-06
  Administered 2021-10-04 – 2021-10-06 (×3): 40 mg via ORAL
  Filled 2021-10-04 (×3): qty 1

## 2021-10-04 MED ORDER — ONDANSETRON HCL 4 MG PO TABS: 4.0000 mg | ORAL_TABLET | Freq: Four times a day (QID) | ORAL | Status: DC | PRN

## 2021-10-04 MED ORDER — ACETAMINOPHEN 325 MG PO TABS: 650.0000 mg | ORAL_TABLET | Freq: Four times a day (QID) | ORAL | Status: DC | PRN

## 2021-10-04 MED ORDER — ACETAMINOPHEN 650 MG RE SUPP: 650.0000 mg | Freq: Four times a day (QID) | RECTAL | Status: DC | PRN

## 2021-10-04 MED ORDER — ALBUTEROL SULFATE (2.5 MG/3ML) 0.083% IN NEBU: 2.5000 mg | INHALATION_SOLUTION | RESPIRATORY_TRACT | Status: DC | PRN

## 2021-10-04 MED ORDER — VITAMIN B-12 1000 MCG PO TABS
1000.0000 ug | ORAL_TABLET | Freq: Every day | ORAL | Status: DC
  Administered 2021-10-04 – 2021-10-06 (×3): 1000 ug via ORAL
  Filled 2021-10-04 (×4): qty 1

## 2021-10-04 MED ORDER — FERROUS SULFATE 325 (65 FE) MG PO TABS
325.0000 mg | ORAL_TABLET | Freq: Every day | ORAL | Status: DC
  Administered 2021-10-04 – 2021-10-06 (×3): 325 mg via ORAL
  Filled 2021-10-04 (×3): qty 1

## 2021-10-04 MED ORDER — APIXABAN 2.5 MG PO TABS
2.5000 mg | ORAL_TABLET | Freq: Two times a day (BID) | ORAL | Status: DC
  Administered 2021-10-04 – 2021-10-06 (×5): 2.5 mg via ORAL
  Filled 2021-10-04 (×6): qty 1

## 2021-10-04 MED ORDER — METOPROLOL TARTRATE 25 MG PO TABS
25.0000 mg | ORAL_TABLET | Freq: Two times a day (BID) | ORAL | Status: DC
Start: 2021-10-04 — End: 2021-10-05
  Administered 2021-10-04 – 2021-10-05 (×3): 25 mg via ORAL
  Filled 2021-10-04 (×3): qty 1

## 2021-10-04 MED ORDER — TIOTROPIUM BROMIDE MONOHYDRATE 18 MCG IN CAPS
18.0000 ug | ORAL_CAPSULE | Freq: Every day | RESPIRATORY_TRACT | Status: DC
  Administered 2021-10-04 – 2021-10-05 (×2): 18 ug via RESPIRATORY_TRACT
  Filled 2021-10-04 (×2): qty 5

## 2021-10-04 MED ORDER — ATORVASTATIN CALCIUM 20 MG PO TABS
10.0000 mg | ORAL_TABLET | Freq: Every day | ORAL | Status: DC
Start: 2021-10-04 — End: 2021-10-06
  Administered 2021-10-04 – 2021-10-06 (×3): 10 mg via ORAL
  Filled 2021-10-04 (×3): qty 1

## 2021-10-04 MED ORDER — SODIUM CHLORIDE 0.9 % IV SOLN: INTRAVENOUS | Status: DC

## 2021-10-04 MED ORDER — ONDANSETRON HCL 4 MG/2ML IJ SOLN: 4.0000 mg | Freq: Four times a day (QID) | INTRAMUSCULAR | Status: DC | PRN

## 2021-10-04 MED ORDER — INSULIN ASPART 100 UNIT/ML IJ SOLN: 0.0000 [IU] | Freq: Every day | INTRAMUSCULAR | Status: DC

## 2021-10-04 MED ORDER — INSULIN ASPART 100 UNIT/ML IJ SOLN: 0.0000 [IU] | Freq: Three times a day (TID) | INTRAMUSCULAR | Status: DC

## 2021-10-04 NOTE — Progress Notes (Addendum)
Per tele monitoring, pt having tons of runs of non sustained vtach, PVCs, pairs of PVCs and afib-vpaced, wide QRS, Dr Francine Graven and RN Ander Gaster and cardiologist made aware, acknowledged

## 2021-10-04 NOTE — Assessment & Plan Note (Signed)
Patient is currently normotensive Hold amlodipine/benazepril due to worsening renal function Continue IV fluid hydration

## 2021-10-04 NOTE — H&P (Signed)
History and Physical    Patient: [name]:[id number] DOB: 04/08/1933 DOA: 10/03/2021 DOS: the patient was seen and examined on 10/04/2021 PCP: Jonetta Osgood, NP  Patient coming from: Home  Chief Complaint:  Chief Complaint  Patient presents with   Chest Pain   HPI: [name] is a 88 y.o. male with medical history significant for prostate cancer, A-fib on anticoagulation, diabetes mellitus, hypertension who presented to the ER for evaluation of chest pain over the left anterior chest wall that started after lunch after he bent over to pick something up.  Pain is intermittent and he denies having any radiation of the pain, no nausea, no vomiting, no diaphoresis, no shortness of breath or palpitation.  His chest pain is worsened by any form of movement. He has had 1 episode of diarrhea but denies having abdominal pain, no fever, no chills, no urinary symptoms, no cough, no dizziness, no lightheadedness. He has ruled out for an acute coronary syndrome but noted to have worsening of his renal function from baseline, 2.19 >> 4.06 with elevated BUN as well. He had a CT scan of chest, abdomen and pelvis which does not show any hydronephrosis. He was borderline hypotensive upon arrival to the ER with systolic blood pressure 99. He will be admitted to the hospital for further evaluation    Review of Systems: As mentioned in the history of present illness. All other systems reviewed and are negative. Past Medical History:  Diagnosis Date   Atrial fibrillation [facility])    CHF (congestive heart failure) ([place])    Diabetes ([place])    Hearing loss    High blood pressure    History of bladder problems    Kidney stones 11/22/2014   Prostate cancer ([place])    Prostate cancer ([place])    Stroke [facility])    Past Surgical History:  Procedure Laterality Date   CATARACT EXTRACTION     COLONOSCOPY WITH PROPOFOL N/A 07/02/2019   Procedure: COLONOSCOPY WITH PROPOFOL;  Surgeon: Lin Landsman, MD;  Location: [facility] ENDOSCOPY;  Service: Gastroenterology;  Laterality: N/A;   IR CT HEAD LTD  08/27/2019   IR PERCUTANEOUS ART THROMBECTOMY/INFUSION INTRACRANIAL INC DIAG ANGIO  08/27/2019       IR PERCUTANEOUS ART THROMBECTOMY/INFUSION INTRACRANIAL INC DIAG ANGIO  08/27/2019   LOWER EXTREMITY ANGIOGRAPHY Left 12/25/2020   Procedure: Lower Extremity Angiography;  Surgeon: Algernon Huxley, MD;  Location: [place] CV LAB;  Service: Cardiovascular;  Laterality: Left;   PACEMAKER LEADLESS INSERTION N/A 12/19/2020   Procedure: PACEMAKER LEADLESS INSERTION;  Surgeon: Isaias Cowman, MD;  Location: [place] [facility] LAB;  Service: Cardiovascular;  Laterality: N/A;   PROSTATE CANCER     PROSTATE SURGERY     RADIOLOGY WITH ANESTHESIA N/A 08/27/2019   Procedure: IR WITH ANESTHESIA;  Surgeon: Luanne Bras, MD;  Location: [place];  Service: Radiology;  Laterality: N/A;   Social History:  reports that he has quit smoking. His smoking use included cigarettes. He has never used smokeless tobacco. He reports that he does not drink alcohol and does not use drugs.  No Known Allergies  Family History  Problem Relation Age of Onset   Colon cancer Mother    Diabetes Other    High blood pressure Other    Prostate cancer Brother    Diabetes Brother     Prior to Admission medications   Medication Sig Start Date End Date Taking? Authorizing Provider  amLODipine-benazepril (LOTREL) 5-10 MG capsule Take 1 capsule  by mouth daily. 08/07/21  Yes Lavera Guise, MD  apixaban (ELIQUIS) 2.5 MG TABS tablet Take 2.5 mg by mouth 2 (two) times daily.   Yes [provider]  atorvastatin (LIPITOR) 10 MG tablet TAKE 1 TABLET(10 MG) BY MOUTH DAILY 07/29/21  Yes Abernathy, Alyssa, NP  FARXIGA 10 MG TABS tablet TAKE 1 TABLET(10 MG) BY MOUTH DAILY BEFORE BREAKFAST 09/27/21  Yes McDonough, Lauren K, PA-C  ferrous sulfate 325 (65 FE) MG tablet Take 325 mg by mouth daily.    Yes [provider]  metoprolol  tartrate (LOPRESSOR) 25 MG tablet TAKE 1 TABLET(25 MG) BY MOUTH TWICE DAILY 06/29/21  Yes Abernathy, Alyssa, NP  pantoprazole (PROTONIX) 40 MG tablet TAKE 1 TABLET(40 MG) BY MOUTH DAILY 09/27/21  Yes Abernathy, Yetta Flock, NP  RYBELSUS 7 MG TABS TAKE 1 TABLET BY MOUTH DAILY 08/09/21  Yes Lavera Guise, MD  Tiotropium Bromide Monohydrate (SPIRIVA RESPIMAT) 2.5 MCG/ACT AERS Inhale 2 puffs into the lungs daily. 03/16/21  Yes Abernathy, Yetta Flock, NP  vitamin B-12 (CYANOCOBALAMIN) 1000 MCG tablet Take 1 tablet (1,000 mcg total) by mouth daily. 12/08/20  Yes Abernathy, Yetta Flock, NP  albuterol (VENTOLIN HFA) 108 (90 Base) MCG/ACT inhaler Inhale 2 puffs into the lungs every 4 (four) hours as needed for wheezing or shortness of breath. 03/16/21   Abernathy, Yetta Flock, NP  glucose blood (ONETOUCH VERIO) test strip USE ONCE DAILY. DX E11.65. 12/08/20   Jonetta Osgood, NP    Physical Exam: Vitals:   10/04/21 0800 10/04/21 0900 10/04/21 1015 10/04/21 1030  BP: 117/73 103/64  108/75  Pulse: 72 78  72  Resp: '18 15 17 18  '$ Temp:      TempSrc:      SpO2: 100% 100%  100%  Weight:      Height:      Physical Exam Vitals and nursing note reviewed.  Constitutional:      Appearance: He is well-developed.  HENT:     Head: Normocephalic and atraumatic.  Eyes:     Pupils: Pupils are equal, round, and reactive to light.  Cardiovascular:     Rate and Rhythm: Normal rate and regular rhythm.     Heart sounds: Normal heart sounds.  Pulmonary:     Effort: Pulmonary effort is normal.     Breath sounds: Normal breath sounds.  Abdominal:     General: Bowel sounds are normal.     Palpations: Abdomen is soft.  Musculoskeletal:        General: Normal range of motion.     Cervical back: Normal range of motion and neck supple.  Skin:    General: Skin is warm and dry.  Neurological:     General: No focal deficit present.     Mental Status: He is alert.  Psychiatric:        Mood and Affect: Mood is anxious.      Data  Reviewed: Relevant notes from primary care and specialist visits, past discharge summaries as available in EHR, including Care Everywhere. Prior diagnostic testing as pertinent to current admission diagnoses Updated medications and problem lists for reconciliation ED course, including vitals, labs, imaging, treatment and response to treatment Triage notes, nursing and pharmacy notes and ED provider's notes Notable results as noted in HPI Labs reviewed.  BNP 144, sodium 141, potassium 4.6, chloride 105, bicarb 26, glucose 128, BUN 64 compared to 31 2 weeks ago, creatinine 4.06 compared to baseline of 2.19, calcium 9.3, white count 6.4, hemoglobin 14.4, hematocrit 46.5, platelet  count 177 CT scan of chest/abdomen/pelvis showed No finding in the chest, abdomen or pelvis to explain substernal chest pain or flank pain. Increased size of multiple retroperitoneal lymph nodes compatible with progression of disease. Unchanged exophytic subscapular lesion in the right lateral hepatic lobe measuring 4.4 cm. Nonobstructing bilateral nephrolithiasis. Coronary artery atherosclerotic calcification. Cardiomegaly. Aortic Atherosclerosis (ICD10-I70.0). 3.3 cm infrarenal abdominal aortic aneurysm Recommend follow-up ultrasound every 3 years. Chest x-ray shows cardiomegaly without acute cardiopulmonary disease Twelve-lead EKG reviewed by me shows wide QRS with frequent PVCs and right bundle branch block There are no new results to review at this time.  Assessment and Plan: * AKI (acute kidney injury) ([place]) Patient noted to have worsening renal function when compared to his baseline, for chronic kidney disease He has a bump in his serum creatinine from 2.17 >> 4.06 Hold benazepril and amlodipine for now since patient is normotensive IV fluid hydration Imaging shows no evidence of obstructive uropathy Repeat renal parameters in a.m.  Type II diabetes mellitus with renal manifestations ([place]) Patient has a known  history of diabetes mellitus on oral agents with complications of stage IV chronic kidney disease. Hold oral agents during this hospitalization Maintain consistent carbohydrate diet Rate control with sliding scale insulin  Chest pain Patient presents for evaluation of chest pain mostly over the left anterior chest wall worse with movement He has ruled out for an acute coronary syndrome but has had multiple ER/primary care visits for same Cardiology consult   Paroxysmal A-fib ([place]) Stable Continue metoprolol for rate control Continue Eliquis as secondary prophylaxis for an acute stroke  Essential hypertension Patient is currently normotensive Hold amlodipine/benazepril due to worsening renal function Continue IV fluid hydration      Advance Care Planning:   Code Status: Full Code   Consults: Nephrology, cardiology  Family Communication: Written and 50% of time was spent discussing patient's condition and plan of care with him and his wife at the bedside.  All questions and concerns have been addressed.  They verbalized understanding and agree with the plan  Severity of Illness: The appropriate patient status for this patient is INPATIENT. Inpatient status is judged to be reasonable and necessary in order to provide the required intensity of service to ensure the patient's safety. The patient's presenting symptoms, physical exam findings, and initial radiographic and laboratory data in the context of their chronic comorbidities is felt to place them at high risk for further clinical deterioration. Furthermore, it is not anticipated that the patient will be medically stable for discharge from the hospital within 2 midnights of admission.   * I certify that at the point of admission it is my clinical judgment that the patient will require inpatient hospital care spanning beyond 2 midnights from the point of admission due to high intensity of service, high risk for further deterioration  and high frequency of surveillance required.*  Author: Collier Bullock, MD 10/04/2021 12:27 PM  For on call review [email or web address].

## 2021-10-04 NOTE — Consult Note (Signed)
[place] NOTE       Patient ID: [name] MRN: [id number] DOB/AGE: 07-28-1933 88 y.o.  Admit date: 10/03/2021 Referring Physician Dr. Francine Graven Primary Physician  Primary Cardiologist Dr. [name] Reason for Consultation chest pain   HPI: [name] is an 88yoM with a PMH of recurrent prostate cancer (on androgen deprivation therapy, declining oral chemotherapy, followed by oncology), sick sinus syndrome s/p Micra leadless pacemaker implantation 11/2020, chronic atrial fibrillation on Eliquis, HFpEF (EF 50%, mild LVH, moderate MR 11/20/2020), hypertension, hyperlipidemia, history of CVA 08/2019, history of LLE DVT s/p mechanical thrombectomy 12/2020 (despite Eliquis) type 2 diabetes, CKD 4, who presented to [facility] ED 10/03/2021 with positional chest pain.  Cardiology is consulted for further assistance.  He presents with his wife who contributes to the history.  He is currently sitting upright in hospital bed, primarily focused on the menu to order lunch during this encounter.  He states that he has had intermittent sharp central chest pain that only occurs when he bends over to pick things up off the ground.  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  His wife is primarily concerned about this due to his prior cardiac history of heart failure and A-fib.  He has no discomfort at rest, the chest pain is not worsened with exertion, associated with shortness of breath or worsened with breathing, he denies palpitations, heart racing, dizziness, orthopnea, or worsening peripheral edema.  He rates the pain at worst a 6 out of 10, describes it as sharp in nature and is short-lived.  He has not tried any medicines to help with the pain.  The only recent medication change she has had was His vitals are notable for recent blood pressure of 134/82, heart rate 76 and a predominantly paced  rhythm with underlying atrial fibrillation and frequent PVCs on telemetry.  Comfortable on room air.  His labs are notable for potassium of 4.6, acute renal failure with BUN 64, creatinine 4.06 and a GFR 14.  2 months ago his renal function was around his baseline with BUN 27, creatinine 2.19 and GFR of 29.  BNP minimally elevated at 144.8, high-sensitivity troponin negative at 15-15.  H&H at baseline at 14/46 platelets 177.  Chest x-ray with cardiomegaly without active disease.  Review of systems complete and found to be negative unless listed above     Past Medical History:  Diagnosis Date   Atrial fibrillation (HCC)    CHF (congestive heart failure) (HCC)    Diabetes ([place])    Hearing loss    High blood pressure    History of bladder problems    Kidney stones 11/22/2014   Prostate cancer ([place])    Prostate cancer ([place])    Stroke [facility])     Past Surgical History:  Procedure Laterality Date   CATARACT EXTRACTION     COLONOSCOPY WITH PROPOFOL N/A 07/02/2019   Procedure: COLONOSCOPY WITH PROPOFOL;  Surgeon: Lin Landsman, MD;  Location: [facility] ENDOSCOPY;  Service: Gastroenterology;  Laterality: N/A;   IR CT HEAD LTD  08/27/2019   IR PERCUTANEOUS ART THROMBECTOMY/INFUSION INTRACRANIAL INC DIAG ANGIO  08/27/2019       IR PERCUTANEOUS ART THROMBECTOMY/INFUSION INTRACRANIAL INC DIAG ANGIO  08/27/2019   LOWER EXTREMITY ANGIOGRAPHY Left 12/25/2020   Procedure: Lower Extremity Angiography;  Surgeon: Algernon Huxley, MD;  Location: [place] CV LAB;  Service: Cardiovascular;  Laterality: Left;  PACEMAKER LEADLESS INSERTION N/A 12/19/2020   Procedure: PACEMAKER LEADLESS INSERTION;  Surgeon: Isaias Cowman, MD;  Location: [place] CV LAB;  Service: Cardiovascular;  Laterality: N/A;   PROSTATE CANCER     PROSTATE SURGERY     RADIOLOGY WITH ANESTHESIA N/A 08/27/2019   Procedure: IR WITH ANESTHESIA;  Surgeon: Luanne Bras, MD;  Location: [place];  Service: Radiology;  Laterality: N/A;     Medications Prior to Admission  Medication Sig Dispense Refill Last Dose   amLODipine-benazepril (LOTREL) 5-10 MG capsule Take 1 capsule by mouth daily. 90 capsule 3 10/03/2021   apixaban (ELIQUIS) 2.5 MG TABS tablet Take 2.5 mg by mouth 2 (two) times daily.   10/03/2021   atorvastatin (LIPITOR) 10 MG tablet TAKE 1 TABLET(10 MG) BY MOUTH DAILY 90 tablet 1 10/03/2021   FARXIGA 10 MG TABS tablet TAKE 1 TABLET(10 MG) BY MOUTH DAILY BEFORE BREAKFAST 90 tablet 1 10/03/2021 at 0700   ferrous sulfate 325 (65 FE) MG tablet Take 325 mg by mouth daily.    10/03/2021   metoprolol tartrate (LOPRESSOR) 25 MG tablet TAKE 1 TABLET(25 MG) BY MOUTH TWICE DAILY 60 tablet 5 10/03/2021   pantoprazole (PROTONIX) 40 MG tablet TAKE 1 TABLET(40 MG) BY MOUTH DAILY 90 tablet 1 10/03/2021   RYBELSUS 7 MG TABS TAKE 1 TABLET BY MOUTH DAILY 90 tablet 3 10/03/2021   Tiotropium Bromide Monohydrate (SPIRIVA RESPIMAT) 2.5 MCG/ACT AERS Inhale 2 puffs into the lungs daily. 4 g 3 10/03/2021   vitamin B-12 (CYANOCOBALAMIN) 1000 MCG tablet Take 1 tablet (1,000 mcg total) by mouth daily. 30 tablet 0 10/03/2021   albuterol (VENTOLIN HFA) 108 (90 Base) MCG/ACT inhaler Inhale 2 puffs into the lungs every 4 (four) hours as needed for wheezing or shortness of breath. 18 g 1 prn at prn   glucose blood (ONETOUCH VERIO) test strip USE ONCE DAILY. DX E11.65. 100 each 3 supply   Social History   Socioeconomic History   Marital status: Married    Spouse name: [name]   Number of children: 2   Years of education: 12   Highest education level: 12th grade  Occupational History   Occupation: [employer]  Tobacco Use   Smoking status: Former    Years: 16.00    Types: Cigarettes   Smokeless tobacco: Never  Vaping Use   Vaping Use: Never used  Substance and Sexual Activity   Alcohol use: No   Drug use: No   Sexual activity: Not Currently  Other Topics Concern   Not on file  Social History Narrative   Not on file   Social  Determinants of Health   Financial Resource Strain: Low Risk  (10/29/2019)   Overall Financial Resource Strain (CARDIA)    Difficulty of Paying Living Expenses: Not very hard  Recent Concern: Financial Resource Strain - Medium Risk (09/08/2019)   Overall Financial Resource Strain (CARDIA)    Difficulty of Paying Living Expenses: Somewhat hard  Food Insecurity: No Food Insecurity (10/04/2021)   Hunger Vital Sign    Worried About Running Out of Food in the Last Year: Never true    Ran Out of Food in the Last Year: Never true  Transportation Needs: No Transportation Needs (10/04/2021)   PRAPARE - Hydrologist (Medical): No    Lack of Transportation (Non-Medical): No  Physical Activity: Inactive (10/29/2019)   Exercise Vital Sign    Days of Exercise per Week: 0 days    Minutes of Exercise per Session:  0 min  Stress: No Stress Concern Present (10/29/2019)   [place]    Feeling of Stress : Only a little  Recent Concern: Stress - Stress Concern Present (09/08/2019)   [place]    Feeling of Stress : To some extent  Social Connections: Moderately Integrated (10/29/2019)   Social Connection and Isolation Panel [NHANES]    Frequency of Communication with Friends and Family: More than three times a week    Frequency of Social Gatherings with Friends and Family: More than three times a week    Attends Religious Services: More than 4 times per year    Active Member of [employer] or Organizations: No    Attends Archivist Meetings: Never    Marital Status: Married  Human resources officer Violence: Not At Risk (10/04/2021)   Humiliation, Afraid, Rape, and Kick questionnaire    Fear of Current or Ex-Partner: No    Emotionally Abused: No    Physically Abused: No    Sexually Abused: No    Family History  Problem Relation Age of Onset   Colon  cancer Mother    Diabetes Other    High blood pressure Other    Prostate cancer Brother    Diabetes Brother       PHYSICAL EXAM General: Elderly black male, well nourished, in no acute distress.  Sitting upright in hospital bed reading the lunch menu with wife at bedside. HEENT:  Normocephalic and atraumatic. Neck:  No JVD.  Lungs: Normal respiratory effort on room air.  Decreased breath sounds without appreciable crackles or wheezes.   Heart: Irregular irregular rhythm with controlled rate. Normal S1 and S2 without gallops or murmurs.  Abdomen: Obese appearing.  Msk: Normal strength and tone for age. Extremities: Warm and well perfused. No clubbing, cyanosis.  No peripheral edema.  Neuro: Alert and oriented X 3. Psych:  Answers questions appropriately.   Labs: Basic Metabolic Panel: Recent Labs    10/03/21 2116  NA 141  K 4.6  CL 105  CO2 26  GLUCOSE 128*  BUN 64*  CREATININE 4.06*  CALCIUM 9.3   Liver Function Tests: No results for input(s): "AST", "ALT", "ALKPHOS", "BILITOT", "PROT", "ALBUMIN" in the last 72 hours. No results for input(s): "LIPASE", "AMYLASE" in the last 72 hours. CBC: Recent Labs    10/03/21 2116  WBC 6.4  HGB 14.4  HCT 46.5  MCV 92.4  PLT 177   Cardiac Enzymes: Recent Labs    10/03/21 2116 10/03/21 2252  TROPONINIHS 15 15   BNP: Invalid input(s): "POCBNP" D-Dimer: No results for input(s): "DDIMER" in the last 72 hours. Hemoglobin A1C: No results for input(s): "HGBA1C" in the last 72 hours. Fasting Lipid Panel: No results for input(s): "CHOL", "HDL", "LDLCALC", "TRIG", "CHOLHDL", "LDLDIRECT" in the last 72 hours. Thyroid Function Tests: No results for input(s): "TSH", "T4TOTAL", "T3FREE", "THYROIDAB" in the last 72 hours.  Invalid input(s): "FREET3" Anemia Panel: No results for input(s): "VITAMINB12", "FOLATE", "FERRITIN", "TIBC", "IRON", "RETICCTPCT" in the last 72 hours.  CT CHEST ABDOMEN PELVIS WO CONTRAST  Result Date:  10/03/2021 CLINICAL DATA:  Flank pain; kidney stone suspected; Substernal chest pain EXAM: CT CHEST, ABDOMEN AND PELVIS WITHOUT CONTRAST TECHNIQUE: Multidetector CT imaging of the chest, abdomen and pelvis was performed following the standard protocol without IV contrast. RADIATION DOSE REDUCTION: This exam was performed according to the departmental dose-optimization program which includes automated exposure control, adjustment of  the mA and/or kV according to patient size and/or use of iterative reconstruction technique. COMPARISON:  CT 01/05/2021 FINDINGS: CT CHEST FINDINGS Cardiovascular: Aortic and coronary artery atherosclerotic calcification. Cardiomegaly. Leadless pacer. Mediastinum/Nodes: No thoracic adenopathy by size. Unchanged prominent prevascular and mediastinal nodes measuring up to 8 mm. Lungs/Pleura: Subpleural reticulation. No pleural effusion or pneumothorax. Scarring/atelectasis in the lingula. The airways are clear. Musculoskeletal: No chest wall mass or suspicious bone lesions identified. CT ABDOMEN PELVIS FINDINGS Hepatobiliary: Exophytic subscapular lesion along the right lateral hepatic lobe again measures 4.4 cm. Multiple additional low-density liver lesions are unchanged and likely represent benign cysts. Pancreas: Unremarkable. No pancreatic ductal dilatation or surrounding inflammatory changes. Spleen: Normal in size without focal abnormality. Adrenals/Urinary Tract: Multiple nonobstructing calyceal stones bilaterally. Bilateral cortical renal scarring and parenchymal calcifications. No obstructing urinary calculi or hydronephrosis. The bladder is decompressed. Unremarkable adrenal glands. Stomach/Bowel: Stomach is unremarkable. Normal caliber large and small bowel. Colonic diverticulosis without diverticulitis. Normal appendix. Vascular/Lymphatic: Aortic atherosclerotic calcification with mild dilation of the infrarenal abdominal aorta measuring 3.3 cm. Multiple enlarged retroperitoneal  and iliac lymph nodes have increased in size. For example a left periaortic node now measures 2.3 cm, previously 1.1 cm (series 2/image 76). A right pericaval lymph node measures 2.5 cm, previously 1.9 cm (series 2/image 88. A right external iliac node measures 2.7 cm, previously 2.2 cm (series 2/image 98). Reproductive: Prostatectomy. Other: Indeterminate predominantly fat density lesion in the anterior pelvis (series 2/image 93 measuring 2.2 cm, previously 0.7 cm. No free intraperitoneal fluid or air. Musculoskeletal: No acute osseous abnormality. IMPRESSION: No finding in the chest, abdomen or pelvis to explain substernal chest pain or flank pain. Increased size of multiple retroperitoneal lymph nodes compatible with progression of disease. Unchanged exophytic subscapular lesion in the right lateral hepatic lobe measuring 4.4 cm. Nonobstructing bilateral nephrolithiasis. Coronary artery atherosclerotic calcification. Cardiomegaly. Aortic Atherosclerosis (ICD10-I70.0). 3.3 cm infrarenal abdominal aortic aneurysm Recommend follow-up ultrasound every 3 years. This recommendation follows ACR consensus guidelines: White Paper of the ACR Incidental Findings Committee II on Vascular Findings. J Am Coll Radiol 2013; 10:789-794. Electronically Signed   By: Placido Sou M.D.   On: 10/03/2021 23:51   DG Chest 1 View  Result Date: 10/03/2021 CLINICAL DATA:  Substernal chest pain EXAM: CHEST  1 VIEW COMPARISON:  08/27/2019, 08/03/2021 FINDINGS: Cardiomegaly. Subsegmental atelectasis or scar at the left base. No acute airspace disease or effusion. No pneumothorax. IMPRESSION: No active disease.  Cardiomegaly Electronically Signed   By: Donavan Foil M.D.   On: 10/03/2021 21:34     Radiology: CT CHEST ABDOMEN PELVIS WO CONTRAST  Result Date: 10/03/2021 CLINICAL DATA:  Flank pain; kidney stone suspected; Substernal chest pain EXAM: CT CHEST, ABDOMEN AND PELVIS WITHOUT CONTRAST TECHNIQUE: Multidetector CT imaging of  the chest, abdomen and pelvis was performed following the standard protocol without IV contrast. RADIATION DOSE REDUCTION: This exam was performed according to the departmental dose-optimization program which includes automated exposure control, adjustment of the mA and/or kV according to patient size and/or use of iterative reconstruction technique. COMPARISON:  CT 01/05/2021 FINDINGS: CT CHEST FINDINGS Cardiovascular: Aortic and coronary artery atherosclerotic calcification. Cardiomegaly. Leadless pacer. Mediastinum/Nodes: No thoracic adenopathy by size. Unchanged prominent prevascular and mediastinal nodes measuring up to 8 mm. Lungs/Pleura: Subpleural reticulation. No pleural effusion or pneumothorax. Scarring/atelectasis in the lingula. The airways are clear. Musculoskeletal: No chest wall mass or suspicious bone lesions identified. CT ABDOMEN PELVIS FINDINGS Hepatobiliary: Exophytic subscapular lesion along the right lateral hepatic lobe again measures 4.4  cm. Multiple additional low-density liver lesions are unchanged and likely represent benign cysts. Pancreas: Unremarkable. No pancreatic ductal dilatation or surrounding inflammatory changes. Spleen: Normal in size without focal abnormality. Adrenals/Urinary Tract: Multiple nonobstructing calyceal stones bilaterally. Bilateral cortical renal scarring and parenchymal calcifications. No obstructing urinary calculi or hydronephrosis. The bladder is decompressed. Unremarkable adrenal glands. Stomach/Bowel: Stomach is unremarkable. Normal caliber large and small bowel. Colonic diverticulosis without diverticulitis. Normal appendix. Vascular/Lymphatic: Aortic atherosclerotic calcification with mild dilation of the infrarenal abdominal aorta measuring 3.3 cm. Multiple enlarged retroperitoneal and iliac lymph nodes have increased in size. For example a left periaortic node now measures 2.3 cm, previously 1.1 cm (series 2/image 76). A right pericaval lymph node  measures 2.5 cm, previously 1.9 cm (series 2/image 88. A right external iliac node measures 2.7 cm, previously 2.2 cm (series 2/image 98). Reproductive: Prostatectomy. Other: Indeterminate predominantly fat density lesion in the anterior pelvis (series 2/image 93 measuring 2.2 cm, previously 0.7 cm. No free intraperitoneal fluid or air. Musculoskeletal: No acute osseous abnormality. IMPRESSION: No finding in the chest, abdomen or pelvis to explain substernal chest pain or flank pain. Increased size of multiple retroperitoneal lymph nodes compatible with progression of disease. Unchanged exophytic subscapular lesion in the right lateral hepatic lobe measuring 4.4 cm. Nonobstructing bilateral nephrolithiasis. Coronary artery atherosclerotic calcification. Cardiomegaly. Aortic Atherosclerosis (ICD10-I70.0). 3.3 cm infrarenal abdominal aortic aneurysm Recommend follow-up ultrasound every 3 years. This recommendation follows ACR consensus guidelines: White Paper of the ACR Incidental Findings Committee II on Vascular Findings. J Am Coll Radiol 2013; 10:789-794. Electronically Signed   By: Placido Sou M.D.   On: 10/03/2021 23:51   DG Chest 1 View  Result Date: 10/03/2021 CLINICAL DATA:  Substernal chest pain EXAM: CHEST  1 VIEW COMPARISON:  08/27/2019, 08/03/2021 FINDINGS: Cardiomegaly. Subsegmental atelectasis or scar at the left base. No acute airspace disease or effusion. No pneumothorax. IMPRESSION: No active disease.  Cardiomegaly Electronically Signed   By: Donavan Foil M.D.   On: 10/03/2021 21:34    ECHO 10/2020 ECHOCARDIOGRAPHIC MEASUREMENTS  2D DIMENSIONS  AORTA                  Values   Normal Range   MAIN PA         Values    Normal Range                Annulus: 2.0 cm       [2.3-2.9]         PA Main: nm*       [1.5-2.1]              Aorta Sin: nm*          [3.1-3.7]    RIGHT VENTRICLE            ST Junction: nm*          [2.6-3.2]         RV Base: nm*       [<4.2]              Asc.Aorta: 2.7  cm       [2.6-3.4]          RV Mid: 3.1 cm    [<3.5]  LEFT VENTRICLE                                      RV Length: nm*       [<  8.6]                  LVIDd: 4.5 cm       [4.2-5.9]    INFERIOR VENA CAVA                  LVIDs: 3.4 cm                        Max. IVC: nm*       [<=2.1]                     FS: 24.9 %       [>25]            Min. IVC: nm*                    SWT: 1.2 cm       [0.6-1.0]    ------------------                    PWT: 1.0 cm       [0.6-1.0]    nm* - not measured  LEFT ATRIUM                LA Diam: 4.7 cm       [3.0-4.0]            LA A4C Area: nm*          [<20]              LA Volume: nm*          [18-58]  _________________________________________________________________________________________  ECHOCARDIOGRAPHIC DESCRIPTIONS  AORTIC ROOT                   Size: Normal             Dissection: INDETERM FOR DISSECTION  AORTIC VALVE               Leaflets: Tricuspid                   Morphology: Normal               Mobility: Fully mobile  LEFT VENTRICLE                   Size: Normal                        Anterior: Normal            Contraction: Normal                         Lateral: Normal             Closest EF: 50% (Estimated)                 Septal: Normal              LV Masses: No Masses                       Apical: Normal                    LVH: MILD LVH                      Inferior: Normal  Posterior: Normal           Dias.FxClass: N/A  MITRAL VALVE               Leaflets: Normal                        Mobility: Fully mobile             Morphology: Normal  LEFT ATRIUM                   Size: MODERATELY ENLARGED          LA Masses: No masses              IA Septum: Normal IAS  MAIN PA                   Size: Normal  PULMONIC VALVE             Morphology: Normal                        Mobility: Fully mobile  RIGHT VENTRICLE              RV Masses: No Masses                         Size: Normal               Free Wall: Normal                     Contraction: Normal  TRICUSPID VALVE               Leaflets: Normal                        Mobility: Fully mobile             Morphology: Normal  RIGHT ATRIUM                   Size: Normal                        RA Other: None                RA Mass: No masses  PERICARDIUM                  Fluid: No effusion  INFERIOR VENACAVA                   Size: Normal Normal respiratory collapse  _________________________________________________________________________________________   DOPPLER ECHO and OTHER SPECIAL PROCEDURES                 Aortic: No AR                      No AS                         77.0 cm/sec peak vel       2.4 mmHg peak grad                 Mitral: MODERATE MR                No MS  MV Inflow E Vel = 95.4 cm/sec     MV Annulus E'Vel = 5.1 cm/sec                         E/E'Ratio = 18.7              Tricuspid: MILD TR                    No TS                         311.0 cm/sec peak TR vel   41.7 mmHg peak RV pressure              Pulmonary: MILD PR                    No PS                         65.8 cm/sec peak vel       1.7 mmHg peak grad  _________________________________________________________________________________________  INTERPRETATION  NORMAL LEFT VENTRICULAR SYSTOLIC FUNCTION   WITH MILD LVH  NORMAL RIGHT VENTRICULAR SYSTOLIC FUNCTION  NO VALVULAR STENOSIS  MODERATE MR  MILD TR, PR  EF 50%    03/10/2020 Lexiscan Myoview Impression  Indeterminant Lexiscan infusion EKG due to baseline EKG changes a left  anterior fascicular block  Normal myocardial perfusion without evidence of myocardial ischemia   [place]  [address], [place], [place]  [id number]  [phone]   Procedure: Pharmacologic Myocardial Perfusion Imaging    ONE day procedure   Indication: Stable angina pectoris  (CMS-HCC)  Plan: NM myocardial perfusion SPECT multiple (stress        and rest), ECG stress test only   Ordering Physician:   Dr. Serafina Royals    Clinical History:  88 y.o. year old male  Vitals: Height: 74 in Weight: 252 lb  Cardiac risk factors include:     CHF, Smoking, Hyperlipidemia, Diabetes and HTN    Procedure:   Pharmacologic stress testing was performed with Regadenoson using a single  use 0.'4mg'$ /79m (0.08 mg/ml) prefilled syringe intravenously infused as a  bolus dose over 10-15 seconds. The stress test was stopped due to Infusion  completion.  Blood pressure response was normal. The patient did not  develop any symptoms other than fatigue during the procedure.   Rest HR: 75bpm  Rest BP: 154/1011mg  Max HR: 103bpm  Min BP: 142/8438m   Stress Test Administered by: GLOEveritt AmberA   ECG Interpretation:  Rest ECG:  normal sinus rhythm, lafb  Stress ECG:  normal sinus rhythm, lafb  Recovery ECG:  normal sinus rhythm  ECG Interpretation:  non-diagnostic due to pharmacologic testing.    Administrations This Visit     regadenoson (LEXISCAN) 0.4 mg/5 mL inj syringe 0.4 mg     Admin Date  03/10/2020 Action  Given Dose  0.4 mg Route  Intravenous Administered By  GoaHerbert SetaNMT     technetium [id number] (CARDIOLITE) injection 10.009.32licurie     Admin Date  03/10/2020 Action  Given Dose  10.067.12licurie Route  Intravenous Administered By  GoarHerbert SetaMT     technetium Tc99m 71mamibi (CARDIOLITE) injection 29.6945.80icurie     Admin Date  03/10/2020 Action  Given Dose  17.79 millicurie Route  Intravenous Administered By  Herbert Seta, CNMT       Gated post-stress perfusion imaging was performed 30 minutes after stress.  Rest images were performed 30 minutes after injection.   Gated LV Analysis:   Summary of LV Perfusion: Normal,   Summary of LV Function: Normal    TID Ratio: 1.1   LVEF= 48%   FINDINGS:   Regional wall motion:  reveals normal myocardial thickening and wall  motion.  The overall quality of the study is good.    Artifacts noted: no  Left ventricular cavity: normal.   TELEMETRY reviewed by me (LT) 10/04/2021 : Currently paced rhythm with underlying atrial fibrillation and frequent PVCs.  EKG reviewed by me: Underlying atrial fibrillation with bifascicular block with occasional paced beats and PVCs.  Data reviewed by me (LT) 10/04/2021: ED note, nursing notes, admission H&P, telemetry, vitals, CBC, BMP, EKGs, chest x-ray, BNP, troponins  ASSESSMENT AND PLAN:  [name] is an 51yoM with a PMH of recurrent prostate cancer (on androgen deprivation therapy, declining oral chemotherapy, followed by oncology), sick sinus syndrome s/p Micra leadless pacemaker implantation 11/2020, chronic atrial fibrillation on Eliquis, HFpEF (EF 50%, mild LVH, moderate MR 11/20/2020), hypertension, hyperlipidemia, history of CVA 08/2019, history of LLE DVT s/p mechanical thrombectomy 12/2020 (despite Eliquis) type 2 diabetes, CKD 4, who [facility] to [facility] ED 10/03/2021 with positional chest pain.  Cardiology is consulted for further assistance.  #Atypical chest pain Occurs only when leaning forward and reaching out his arms to pick up objects, described as a sharp pain that is short-lived, nonexertional without associated diaphoresis, nausea or radiation.  Troponins are negative on 2 repeats, EKG is nonischemic.  Consider other causes of his discomfort, musculoskeletal, etc.  #Acute renal failure, history of CKD 4 Was started on torsemide 20 mg once daily for peripheral edema on 8/30, renal function has significantly worsened on admission with creatinine 4.06, GFR 14.  Baseline from the past several months was 2.2 and GFR of 29 -Discontinue torsemide -Hold Farxiga for now, restart as renal function improves  #Chronic HFpEF (EF 50%, mild LVH, moderate MR 11/20/2020) BNP minimally elevated at 144.   Does not appear clinically volume overloaded. -Continue GDMT with metoprolol tartrate 25 mg twice a day -Continue atorvastatin 10 mg once daily -Hold Farxiga as above  #Chronic atrial fibrillation on dose reduced Eliquis #Sick sinus syndrome s/p Micra leadless pacemaker Underlying atrial fibrillation with predominantly paced beats and frequent PVCs.  Rate controlled in the 70s to 80s on telemetry -Continue metoprolol tartrate 25 mg twice a day -Continue dose reduced Eliquis 2.5 mg twice daily with his age and poor renal function.  CHA2DS2-VASc 7   No further cardiac diagnostics necessary.  We will sign off.  Please reengage if needed  This patient's plan of care was discussed and created with Dr. Saralyn Pilar and he is in agreement.  Signed: Tristan Schroeder , PA-C 10/04/2021, 3:00 PM [facility]

## 2021-10-04 NOTE — Assessment & Plan Note (Signed)
Patient noted to have worsening renal function when compared to his baseline, for chronic kidney disease He has a bump in his serum creatinine from 2.17 >> 4.06 Hold benazepril and amlodipine for now since patient is normotensive IV fluid hydration Imaging shows no evidence of obstructive uropathy Repeat renal parameters in a.m.

## 2021-10-04 NOTE — Plan of Care (Signed)

## 2021-10-04 NOTE — Assessment & Plan Note (Addendum)
Patient presents for evaluation of chest pain mostly over the left anterior chest wall worse with movement He has ruled out for an acute coronary syndrome but has had multiple ER/primary care visits for same Cardiology consult

## 2021-10-04 NOTE — Assessment & Plan Note (Signed)
Stable Continue metoprolol for rate control Continue Eliquis as secondary prophylaxis for an acute stroke

## 2021-10-04 NOTE — Assessment & Plan Note (Signed)
Recent hemoglobin A1c 6.8.  Patient on Farxiga.  Can go back on this medication as outpatient.

## 2021-10-05 DIAGNOSIS — E1122 Type 2 diabetes mellitus with diabetic chronic kidney disease: Secondary | ICD-10-CM | POA: Diagnosis not present

## 2021-10-05 DIAGNOSIS — Z794 Long term (current) use of insulin: Secondary | ICD-10-CM

## 2021-10-05 DIAGNOSIS — I4729 Other ventricular tachycardia: Secondary | ICD-10-CM

## 2021-10-05 DIAGNOSIS — I10 Essential (primary) hypertension: Secondary | ICD-10-CM | POA: Diagnosis not present

## 2021-10-05 DIAGNOSIS — N184 Chronic kidney disease, stage 4 (severe): Secondary | ICD-10-CM

## 2021-10-05 DIAGNOSIS — I482 Chronic atrial fibrillation, unspecified: Secondary | ICD-10-CM

## 2021-10-05 DIAGNOSIS — N179 Acute kidney failure, unspecified: Secondary | ICD-10-CM | POA: Diagnosis not present

## 2021-10-05 DIAGNOSIS — E669 Obesity, unspecified: Secondary | ICD-10-CM

## 2021-10-05 DIAGNOSIS — N189 Chronic kidney disease, unspecified: Secondary | ICD-10-CM

## 2021-10-05 LAB — BASIC METABOLIC PANEL
Anion gap: 7 (ref 5–15)
BUN: 44 mg/dL — ABNORMAL HIGH (ref 8–23)
CO2: 23 mmol/L (ref 22–32)
Calcium: 8.9 mg/dL (ref 8.9–10.3)
Chloride: 112 mmol/L — ABNORMAL HIGH (ref 98–111)
Creatinine, Ser: 2.39 mg/dL — ABNORMAL HIGH (ref 0.61–1.24)
GFR, Estimated: 26 mL/min — ABNORMAL LOW (ref >60)
Glucose, Bld: 100 mg/dL — ABNORMAL HIGH (ref 70–99)
Potassium: 4 mmol/L (ref 3.5–5.1)
Sodium: 142 mmol/L (ref 135–145)

## 2021-10-05 LAB — CBC
HCT: 47.2 % (ref 39.0–52.0)
Hemoglobin: 14.7 g/dL (ref 13.0–17.0)
MCH: 29 pg (ref 26.0–34.0)
MCHC: 31.1 g/dL (ref 30.0–36.0)
MCV: 93.1 fL (ref 80.0–100.0)
Platelets: 159 10*3/uL (ref 150–400)
RBC: 5.07 MIL/uL (ref 4.22–5.81)
RDW: 14.9 % (ref 11.5–15.5)
WBC: 4.3 10*3/uL (ref 4.0–10.5)
nRBC: 0 % (ref 0.0–0.2)

## 2021-10-05 LAB — GLUCOSE, CAPILLARY
Glucose-Capillary: 91 mg/dL (ref 70–99)
Glucose-Capillary: 88 mg/dL (ref 70–99)
Glucose-Capillary: 101 mg/dL — ABNORMAL HIGH (ref 70–99)
Glucose-Capillary: 87 mg/dL (ref 70–99)

## 2021-10-05 MED ORDER — METOPROLOL TARTRATE 25 MG PO TABS
37.5000 mg | ORAL_TABLET | Freq: Two times a day (BID) | ORAL | Status: DC
  Administered 2021-10-05 – 2021-10-06 (×2): 37.5 mg via ORAL
  Filled 2021-10-05 (×2): qty 2

## 2021-10-05 MED ORDER — MAGNESIUM SULFATE 2 GM/50ML IV SOLN
2.0000 g | Freq: Once | INTRAVENOUS | Status: AC
  Administered 2021-10-05: 2 g via INTRAVENOUS
  Filled 2021-10-05: qty 50

## 2021-10-05 MED ORDER — TAMSULOSIN HCL 0.4 MG PO CAPS
0.4000 mg | ORAL_CAPSULE | Freq: Every day | ORAL | Status: DC
  Administered 2021-10-05 – 2021-10-06 (×2): 0.4 mg via ORAL
  Filled 2021-10-05 (×2): qty 1

## 2021-10-05 NOTE — TOC Initial Note (Signed)
Transition of Care [facility]) - Initial/Assessment Note    Patient Details  Name: [name] MRN: [id number] Date of Birth: 12/02/1933  Transition of Care [facility]) CM/SW Contact:    Laurena Slimmer, RN Phone Number: 10/05/2021, 4:33 PM  Clinical Narrative:                  Transition of Care [facility]) Screening Note   Patient Details  Name: [name] Date of Birth: 13-Feb-1933   Transition of Care [facility]) CM/SW Contact:    Laurena Slimmer, RN Phone Number: 10/05/2021, 4:33 PM    Transition of Care Department [facility]) has reviewed patient and no TOC needs have been identified at this time. We will continue to monitor patient advancement through interdisciplinary progression rounds. If new patient transition needs arise, please place a TOC consult.           Patient Goals and CMS Choice        Expected Discharge Plan and Services                                                Prior Living Arrangements/Services                       Activities of Daily Living Home Assistive Devices/Equipment: None ADL Screening (condition at time of admission) Patient's cognitive ability adequate to safely complete daily activities?: Yes Is the patient deaf or have difficulty hearing?: Yes Does the patient have difficulty seeing, even when wearing glasses/contacts?: No Does the patient have difficulty concentrating, remembering, or making decisions?: No Patient able to express need for assistance with ADLs?: Yes Does the patient have difficulty dressing or bathing?: No Independently performs ADLs?: Yes (appropriate for developmental age) Does the patient have difficulty walking or climbing stairs?: No Weakness of Legs: Both Weakness of Arms/Hands: None  Permission Sought/Granted                  Emotional Assessment              Admission diagnosis:  AKI (acute kidney injury) ([place]) [N17.9] Patient Active Problem List   Diagnosis Date Noted    Obesity (BMI 30-39.9) 10/05/2021   NSVT (nonsustained ventricular tachycardia) ([place]) 10/05/2021   Acute kidney injury superimposed on CKD ([place]) 10/04/2021   Acute deep vein thrombosis (DVT) of femoral vein of left lower extremity ([place]) 01/08/2021   Critical limb ischemia of left lower extremity ([place]) 12/25/2020   CKD (chronic kidney disease) stage 4, GFR 15-29 ml/min ([place]) 12/25/2020   Type II diabetes mellitus with renal manifestations ([place]) 12/25/2020   Stroke ([place]) 12/25/2020   Sick sinus syndrome ([place]) 12/19/2020   Retroperitoneal lymphadenopathy 04/26/2020   Other specified disorders of kidney and ureter 01/02/2020   Abdominal distension 10/31/2019   Atherosclerosis of aorta ([place]) 10/15/2019   Androgen deprivation therapy 10/13/2019   Other constipation 08/29/2019   Acute ischemic left MCA stroke ([place]) 08/27/2019   Normocytic anemia 08/05/2019   Prostate cancer ([place]) 08/05/2019   Goals of care, counseling/discussion 07/22/2019   Fecal impaction ([place]) 07/11/2019   Lower GI bleeding 06/30/2019   Diverticulosis of colon 02/07/2019   Chronic obstructive pulmonary disease ([place])    Chronic a-fib ([place])    Type 2 diabetes mellitus with hyperglycemia ([place]) 10/23/2018   Acute or subacute form of ischemic  heart disease ([place]) 10/23/2018   Need for vaccination against Streptococcus pneumoniae using pneumococcal conjugate vaccine 13 10/23/2018   Chronic mesenteric ischemia ([place]) 07/28/2018   Abdominal aortic aneurysm without rupture ([place]) 07/28/2018   Primary osteoarthritis of left hip 02/12/2018   Bilateral hearing loss due to cerumen impaction 55/73/2202   Chronic systolic heart failure ([place]) 08/19/2017   Atrial fibrillation with RVR ([place]) 08/02/2017   Acute on chronic congestive heart failure ([place]) 08/02/2017   BPH (benign prostatic hyperplasia) 07/20/2017   Dysuria 07/20/2017   Hyperpigmentation 04/10/2017   Uncontrolled type 2 diabetes mellitus with hyperglycemia ([place]) 03/24/2017    Encounter for general adult medical examination with abnormal findings 03/24/2017   Chest pain 05/04/2016   Elevated troponin 05/04/2016   Essential hypertension 07/06/2014   Cough 03/21/2014   OSA (obstructive sleep apnea) 03/21/2014   Hx of adenomatous colonic polyps 06/28/2013   Liver cyst 06/28/2013   Chronic kidney disease 06/08/2013   Hyperlipidemia, mixed 06/08/2013   Valvular heart disease 06/08/2013   PCP:  Jonetta Osgood, NP Pharmacy:   [id number] - [address] [place] [address] Phone: [phone] Fax: [phone]     Social Determinants of Health (SDOH) Interventions    Readmission Risk Interventions    12/26/2020   12:08 PM 08/30/2019    1:21 PM  Readmission Risk Prevention Plan  Transportation Screening Complete Complete  PCP or Specialist Appt within 3-5 Days Complete Not Complete  Not Complete comments  Plan for SNF  HRI or Home Care Consult Complete Complete  Social Work Consult for [place] Planning/Counseling Complete Complete  Palliative Care Screening Not Applicable Not Applicable  Medication Review (RN Care Manager)  Referral to Pharmacy

## 2021-10-05 NOTE — Assessment & Plan Note (Signed)
Seen by cardiology.  Replace electrolytes during the hospital course

## 2021-10-05 NOTE — Assessment & Plan Note (Signed)
Start Flomax

## 2021-10-05 NOTE — Assessment & Plan Note (Signed)
Follows with Dr. Tasia Catchings as outpatient.  CT scan shows a stable liver lesion and enlarged retroperitoneal lymph nodes.

## 2021-10-05 NOTE — Progress Notes (Signed)
per tele phone call- patient had 4 beat runs of v-tach and PVCs. MD and cardiology notified- new orders received.

## 2021-10-05 NOTE — Assessment & Plan Note (Signed)
Continue Eliquis for anticoagulation and metoprolol for rate control.

## 2021-10-05 NOTE — Assessment & Plan Note (Signed)
BMI 32.58

## 2021-10-05 NOTE — Progress Notes (Signed)
  Progress Note   Patient: [name] ZOX:[id number] DOB: [birth year] DOA: 10/03/2021     1 DOS: the patient was seen and examined on 10/05/2021     Assessment and Plan: * Acute kidney injury superimposed on CKD ([place]) Acute kidney injury on CKD stage IV.  Baseline creatinine around 2.16-2.11.  Creatinine on presentation 4.06.  Hold torsemide.  Today's creatinine 2.39.  Decrease rate of IV fluids and recheck creatinine tomorrow.  Hopefully discharge tomorrow morning  Type II diabetes mellitus with renal manifestations (HCC) Recent hemoglobin A1c 6.8 last 3 sugars 02/04/1989 and 88.  Patient on Farxiga  Prostate cancer [facility]) Follows with Dr. Scherrie Bateman as outpatient.  CT scan shows a liver lesion and retroperitoneal lymph nodes.  Chronic a-fib (HCC) Continue Eliquis for anticoagulation and metoprolol for rate control.  Essential hypertension Patient is currently normotensive Hold amlodipine/benazepril due to worsening renal function Continue IV fluid hydration  NSVT (nonsustained ventricular tachycardia) (HCC) Potassium normal range, will give IV magnesium.  Obesity (BMI 30-39.9) BMI 32.58  Chest pain Cardiac enzymes negative.   BPH (benign prostatic hyperplasia) Start Flomax.        Subjective: Patient feeling okay.  States he is urinating but only urinating a little bit at a time.  Had some swelling in the lower extremity and recently started on torsemide.  Physical Exam: Vitals:   10/04/21 2338 10/05/21 0431 10/05/21 0739 10/05/21 1139  BP: 112/79 117/76 128/84 115/79  Pulse: (!) 45 71 72 70  Resp: '20 16 17 17  '$ Temp: 97.6 F (36.4 C) 98 F (36.7 C) 97.7 F (36.5 C) (!) 97.5 F (36.4 C)  TempSrc: Oral Oral    SpO2: 94% 95% 95% 97%  Weight:      Height:       Physical Exam HENT:     Head: Normocephalic.     Mouth/Throat:     Pharynx: No oropharyngeal exudate.  Eyes:     General: Lids are normal.     Conjunctiva/sclera: Conjunctivae normal.   Cardiovascular:     Rate and Rhythm: Normal rate and regular rhythm.     Heart sounds: Normal heart sounds, S1 normal and S2 normal.  Pulmonary:     Breath sounds: Normal breath sounds. No decreased breath sounds, wheezing, rhonchi or rales.  Abdominal:     Palpations: Abdomen is soft.     Tenderness: There is no abdominal tenderness.  Musculoskeletal:     Right lower leg: No swelling.     Left lower leg: No swelling.  Skin:    General: Skin is warm.  Neurological:     Mental Status: He is alert and oriented to person, place, and time.     Data Reviewed: Creatinine down to 2.3, CBC normal range   Family Communication: Spoke with wife at the bedside  Disposition: Status is: Inpatient Remains inpatient appropriate because: We will recheck creatinine tomorrow.  Continue IV fluids today  Planned Discharge Destination: Home    Time spent: 28 minutes  Author: Loletha Grayer, MD 10/05/2021 2:23 PM  For on call review [email or web address].

## 2021-10-06 DIAGNOSIS — C61 Malignant neoplasm of prostate: Secondary | ICD-10-CM

## 2021-10-06 DIAGNOSIS — I482 Chronic atrial fibrillation, unspecified: Secondary | ICD-10-CM | POA: Diagnosis not present

## 2021-10-06 DIAGNOSIS — I714 Abdominal aortic aneurysm, without rupture, unspecified: Secondary | ICD-10-CM

## 2021-10-06 DIAGNOSIS — E1122 Type 2 diabetes mellitus with diabetic chronic kidney disease: Secondary | ICD-10-CM | POA: Diagnosis not present

## 2021-10-06 DIAGNOSIS — R079 Chest pain, unspecified: Secondary | ICD-10-CM

## 2021-10-06 DIAGNOSIS — N179 Acute kidney failure, unspecified: Secondary | ICD-10-CM | POA: Diagnosis not present

## 2021-10-06 LAB — BASIC METABOLIC PANEL
Anion gap: 6 (ref 5–15)
BUN: 35 mg/dL — ABNORMAL HIGH (ref 8–23)
CO2: 24 mmol/L (ref 22–32)
Calcium: 9.1 mg/dL (ref 8.9–10.3)
Chloride: 112 mmol/L — ABNORMAL HIGH (ref 98–111)
Creatinine, Ser: 2.04 mg/dL — ABNORMAL HIGH (ref 0.61–1.24)
GFR, Estimated: 31 mL/min — ABNORMAL LOW (ref >60)
Glucose, Bld: 104 mg/dL — ABNORMAL HIGH (ref 70–99)
Potassium: 4.1 mmol/L (ref 3.5–5.1)
Sodium: 142 mmol/L (ref 135–145)

## 2021-10-06 LAB — GLUCOSE, CAPILLARY: Glucose-Capillary: 98 mg/dL (ref 70–99)

## 2021-10-06 MED ORDER — METOPROLOL TARTRATE 37.5 MG PO TABS: 37.5000 mg | ORAL_TABLET | Freq: Two times a day (BID) | ORAL | 0 refills | Status: DC

## 2021-10-06 MED ORDER — TAMSULOSIN HCL 0.4 MG PO CAPS: 0.4000 mg | ORAL_CAPSULE | Freq: Every day | ORAL | 0 refills | Status: DC

## 2021-10-06 NOTE — Progress Notes (Signed)
Pt verbalized that his son was at the [place] entrance; pt discharged via wheelchair by nursing to the [place] entrance

## 2021-10-06 NOTE — Discharge Summary (Signed)
Physician Discharge Summary   Patient: [name] MRN: [id number] DOB: 08/09/1933  Admit date:     10/03/2021  Discharge date: 10/06/21  Discharge Physician: Loletha Grayer   PCP: Jonetta Osgood, NP   Recommendations at discharge:   Follow-up PCP 5 days Follow-up your cardiologist Referral to Dr. Candiss Norse nephrology as outpatient Follow-up with Dr. Tasia Catchings oncology as scheduled  Discharge Diagnoses: Principal Problem:   Acute kidney injury superimposed on CKD [facility]) Active Problems:   Type II diabetes mellitus with renal manifestations ([place])   Prostate cancer ([place])   Chronic a-fib ([place])   Essential hypertension   NSVT (nonsustained ventricular tachycardia) (HCC)   Obesity (BMI 30-39.9)   Chest pain   Abdominal aortic aneurysm without rupture [facility])    Hospital Course: The patient was admitted to the hospital on 10/04/2021 and discharged on 10/06/2021.  Initially came in with chest pain and was found to be in acute kidney injury on chronic kidney disease.  Patient was given IV fluid hydration.  Creatinine went from 4.06 down to 2.04.  Patient was started on Flomax for difficulty getting his urine out.  Patient was feeling better upon discharge.  Assessment and Plan: * Acute kidney injury superimposed on CKD ([place]) Acute kidney injury on CKD stage IV.  Baseline creatinine around 2.16-2.11.  Creatinine on presentation 4.06.  Hold torsemide and amlodipine/benazepril.  Today's creatinine 2.04.  Will refer to nephrology as outpatient.  Started Flomax to help out with urine flow.  Type II diabetes mellitus with renal manifestations (HCC) Recent hemoglobin A1c 6.8.  Patient on Farxiga.  Can go back on this medication as outpatient.  Prostate cancer [facility]) Follows with Dr. Tasia Catchings as outpatient.  CT scan shows a stable liver lesion and enlarged retroperitoneal lymph nodes.  Chronic a-fib (HCC) Continue Eliquis for anticoagulation and metoprolol for rate control.  Essential  hypertension Patient is currently normotensive Hold amlodipine/benazepril and torsemide at this time.  NSVT (nonsustained ventricular tachycardia) ([place]) Seen by cardiology.  Replace electrolytes during the hospital course  Obesity (BMI 30-39.9) BMI 32.58  Chest pain Cardiac enzymes negative.   Abdominal aortic aneurysm without rupture (HCC) 3.3 cm infrarenal abdominal aortic aneurysm seen on CT scan.  Recommend follow-up every 3 years with ultrasound         Consultants: Cardiology Procedures performed: None Disposition: Home Diet recommendation:  Cardiac diet DISCHARGE MEDICATION: Allergies as of 10/06/2021   No Known Allergies      Medication List     STOP taking these medications    amLODipine-benazepril 5-10 MG capsule Commonly known as: Lotrel   Rybelsus 7 MG Tabs Generic drug: Semaglutide       TAKE these medications    albuterol 108 (90 Base) MCG/ACT inhaler Commonly known as: VENTOLIN HFA Inhale 2 puffs into the lungs every 4 (four) hours as needed for wheezing or shortness of breath.   apixaban 2.5 MG Tabs tablet Commonly known as: ELIQUIS Take 2.5 mg by mouth 2 (two) times daily.   atorvastatin 10 MG tablet Commonly known as: LIPITOR TAKE 1 TABLET(10 MG) BY MOUTH DAILY   cyanocobalamin 1000 MCG tablet Commonly known as: VITAMIN B12 Take 1 tablet (1,000 mcg total) by mouth daily.   Farxiga 10 MG Tabs tablet Generic drug: dapagliflozin propanediol TAKE 1 TABLET(10 MG) BY MOUTH DAILY BEFORE BREAKFAST   ferrous sulfate 325 (65 FE) MG tablet Take 325 mg by mouth daily.   Metoprolol Tartrate 37.5 MG Tabs Take 37.5 mg by mouth 2 (two) times daily.  What changed:  medication strength See the new instructions.   OneTouch Verio test strip Generic drug: glucose blood USE ONCE DAILY. DX E11.65.   pantoprazole 40 MG tablet Commonly known as: PROTONIX TAKE 1 TABLET(40 MG) BY MOUTH DAILY   Spiriva Respimat 2.5 MCG/ACT Aers Generic drug:  Tiotropium Bromide Monohydrate Inhale 2 puffs into the lungs daily.   tamsulosin 0.4 MG Caps capsule Commonly known as: FLOMAX Take 1 capsule (0.4 mg total) by mouth daily.        Follow-up Information     Abernathy, Yetta Flock, NP Follow up in 5 day(s).   Specialty: Nurse Practitioner Why: Patient to make appointment when office opens on Monday Contact information: [place] [address] [phone]         Murlean Iba, MD Follow up in 3 week(s).   Specialty: Nephrology Why: Patient to make appointment when office opens on Monday Contact information: [place] [address] [phone]         Earlie Server, MD Follow up.   Specialty: Oncology Why: keep appointment 9/29 Contact information: [place] [address] [phone]         Corey Skains, MD Follow up in 2 week(s).   Specialty: Cardiology Why: Patient to make appointment when office opens on Monday Contact information: [facility] [place] [address] [phone]                Discharge Exam: Danley Danker Weights   10/03/21 2059  Weight: 109 kg   Physical Exam HENT:     Head: Normocephalic.     Mouth/Throat:     Pharynx: No oropharyngeal exudate.  Eyes:     General: Lids are normal.     Conjunctiva/sclera: Conjunctivae normal.  Cardiovascular:     Rate and Rhythm: Normal rate and regular rhythm.     Heart sounds: Normal heart sounds, S1 normal and S2 normal.  Pulmonary:     Breath sounds: Examination of the right-lower field reveals decreased breath sounds. Examination of the left-lower field reveals decreased breath sounds. Decreased breath sounds present. No wheezing, rhonchi or rales.  Abdominal:     Palpations: Abdomen is soft.     Tenderness: There is no abdominal tenderness.  Musculoskeletal:     Right lower leg: No swelling.     Left lower leg: No swelling.   Skin:    General: Skin is warm.  Neurological:     Mental Status: He is alert and oriented to person, place, and time.      Condition at discharge: stable  The results of significant diagnostics from this hospitalization (including imaging, microbiology, ancillary and laboratory) are listed below for reference.   Imaging Studies: CT CHEST ABDOMEN PELVIS WO CONTRAST  Result Date: 10/03/2021 CLINICAL DATA:  Flank pain; kidney stone suspected; Substernal chest pain EXAM: CT CHEST, ABDOMEN AND PELVIS WITHOUT CONTRAST TECHNIQUE: Multidetector CT imaging of the chest, abdomen and pelvis was performed following the standard protocol without IV contrast. RADIATION DOSE REDUCTION: This exam was performed according to the departmental dose-optimization program which includes automated exposure control, adjustment of the mA and/or kV according to patient size and/or use of iterative reconstruction technique. COMPARISON:  CT 01/05/2021 FINDINGS: CT CHEST FINDINGS Cardiovascular: Aortic and coronary artery atherosclerotic calcification. Cardiomegaly. Leadless pacer. Mediastinum/Nodes: No thoracic adenopathy by size. Unchanged prominent prevascular and mediastinal nodes measuring up to 8 mm. Lungs/Pleura: Subpleural reticulation. No pleural effusion or pneumothorax. Scarring/atelectasis  in the lingula. The airways are clear. Musculoskeletal: No chest wall mass or suspicious bone lesions identified. CT ABDOMEN PELVIS FINDINGS Hepatobiliary: Exophytic subscapular lesion along the right lateral hepatic lobe again measures 4.4 cm. Multiple additional low-density liver lesions are unchanged and likely represent benign cysts. Pancreas: Unremarkable. No pancreatic ductal dilatation or surrounding inflammatory changes. Spleen: Normal in size without focal abnormality. Adrenals/Urinary Tract: Multiple nonobstructing calyceal stones bilaterally. Bilateral cortical renal scarring and parenchymal calcifications. No  obstructing urinary calculi or hydronephrosis. The bladder is decompressed. Unremarkable adrenal glands. Stomach/Bowel: Stomach is unremarkable. Normal caliber large and small bowel. Colonic diverticulosis without diverticulitis. Normal appendix. Vascular/Lymphatic: Aortic atherosclerotic calcification with mild dilation of the infrarenal abdominal aorta measuring 3.3 cm. Multiple enlarged retroperitoneal and iliac lymph nodes have increased in size. For example a left periaortic node now measures 2.3 cm, previously 1.1 cm (series 2/image 76). A right pericaval lymph node measures 2.5 cm, previously 1.9 cm (series 2/image 88. A right external iliac node measures 2.7 cm, previously 2.2 cm (series 2/image 98). Reproductive: Prostatectomy. Other: Indeterminate predominantly fat density lesion in the anterior pelvis (series 2/image 93 measuring 2.2 cm, previously 0.7 cm. No free intraperitoneal fluid or air. Musculoskeletal: No acute osseous abnormality. IMPRESSION: No finding in the chest, abdomen or pelvis to explain substernal chest pain or flank pain. Increased size of multiple retroperitoneal lymph nodes compatible with progression of disease. Unchanged exophytic subscapular lesion in the right lateral hepatic lobe measuring 4.4 cm. Nonobstructing bilateral nephrolithiasis. Coronary artery atherosclerotic calcification. Cardiomegaly. Aortic Atherosclerosis (ICD10-I70.0). 3.3 cm infrarenal abdominal aortic aneurysm Recommend follow-up ultrasound every 3 years. This recommendation follows ACR consensus guidelines: White Paper of the ACR Incidental Findings Committee II on Vascular Findings. J Am Coll Radiol 2013; 10:789-794. Electronically Signed   By: Placido Sou M.D.   On: 10/03/2021 23:51   DG Chest 1 View  Result Date: 10/03/2021 CLINICAL DATA:  Substernal chest pain EXAM: CHEST  1 VIEW COMPARISON:  08/27/2019, 08/03/2021 FINDINGS: Cardiomegaly. Subsegmental atelectasis or scar at the left base. No acute  airspace disease or effusion. No pneumothorax. IMPRESSION: No active disease.  Cardiomegaly Electronically Signed   By: Donavan Foil M.D.   On: 10/03/2021 21:34    Microbiology: Results for orders placed or performed during the hospital encounter of 12/25/20  Resp Panel by RT-PCR (Flu A&B, Covid) Nasopharyngeal Swab     Status: None   Collection Time: 12/25/20  8:34 AM   Specimen: Nasopharyngeal Swab; Nasopharyngeal(NP) swabs in vial transport medium  Result Value Ref Range Status   SARS Coronavirus 2 by RT PCR NEGATIVE NEGATIVE Final    Comment: (NOTE) SARS-CoV-2 target nucleic acids are NOT DETECTED.  The SARS-CoV-2 RNA is generally detectable in upper respiratory specimens during the acute phase of infection. The lowest concentration of SARS-CoV-2 viral copies this assay can detect is 138 copies/mL. A negative result does not preclude SARS-Cov-2 infection and should not be used as the sole basis for treatment or other patient management decisions. A negative result may occur with  improper specimen collection/handling, submission of specimen other than nasopharyngeal swab, presence of viral mutation(s) within the areas targeted by this assay, and inadequate number of viral copies(<138 copies/mL). A negative result must be combined with clinical observations, patient history, and epidemiological information. The expected result is Negative.  Fact Sheet for Patients:  EntrepreneurPulse.com.au  Fact Sheet for Healthcare Providers:  IncredibleEmployment.be  This test is no t yet approved or cleared by the [place] FDA and  has been  authorized for detection and/or diagnosis of SARS-CoV-2 by FDA under an Emergency Use Authorization (EUA). This EUA will remain  in effect (meaning this test can be used) for the duration of the COVID-19 declaration under Section 564(b)(1) of the Act, 21 U.S.C.section 360bbb-3(b)(1), unless the authorization is  terminated  or revoked sooner.       Influenza A by PCR NEGATIVE NEGATIVE Final   Influenza B by PCR NEGATIVE NEGATIVE Final    Comment: (NOTE) The Xpert Xpress SARS-CoV-2/FLU/RSV plus assay is intended as an aid in the diagnosis of influenza from Nasopharyngeal swab specimens and should not be used as a sole basis for treatment. Nasal washings and aspirates are unacceptable for Xpert Xpress SARS-CoV-2/FLU/RSV testing.  Fact Sheet for Patients: EntrepreneurPulse.com.au  Fact Sheet for Healthcare Providers: IncredibleEmployment.be  This test is not yet approved or cleared by the [place] FDA and has been authorized for detection and/or diagnosis of SARS-CoV-2 by FDA under an Emergency Use Authorization (EUA). This EUA will remain in effect (meaning this test can be used) for the duration of the COVID-19 declaration under Section 564(b)(1) of the Act, 21 U.S.C. section 360bbb-3(b)(1), unless the authorization is terminated or revoked.  Performed at [facility], [address] [place], [place] [address]     Labs: CBC: Recent Labs  Lab 10/03/21 2116 10/05/21 0745  WBC 6.4 4.3  HGB 14.4 14.7  HCT 46.5 47.2  MCV 92.4 93.1  PLT 177 035   Basic Metabolic Panel: Recent Labs  Lab 10/03/21 2116 10/04/21 1544 10/05/21 0745 10/06/21 0741  NA 141  --  142 142  K 4.6  --  4.0 4.1  CL 105  --  112* 112*  CO2 26  --  23 24  GLUCOSE 128*  --  100* 104*  BUN 64*  --  44* 35*  CREATININE 4.06*  --  2.39* 2.04*  CALCIUM 9.3  --  8.9 9.1  MG  --  1.9  --   --     CBG: Recent Labs  Lab 10/05/21 0737 10/05/21 1137 10/05/21 1606 10/05/21 2122 10/06/21 0754  GLUCAP 91 88 101* 87 98    Discharge time spent: greater than 30 minutes.  Signed: Loletha Grayer, MD [facility] 10/06/2021

## 2021-10-06 NOTE — Plan of Care (Signed)

## 2021-10-06 NOTE — Progress Notes (Signed)
MD order received in [facility] to discharge pt home today; verbally reviewed AVS with pt; Rxs escribed to the [facility] in [place], [place]; no questions voiced at this time; pt verbalized that he would call his son for a ride home; verbally instructed pt to let us know when his son was at the [facility] entrance waiting to pick him up in order for the staff to take him out in a wheelchair for pt discharge

## 2021-10-06 NOTE — Assessment & Plan Note (Signed)
3.3 cm infrarenal abdominal aortic aneurysm seen on CT scan.  Recommend follow-up every 3 years with ultrasound

## 2021-10-09 ENCOUNTER — Inpatient Hospital Stay: Payer: Medicare Other | Admitting: Internal Medicine

## 2021-10-11 ENCOUNTER — Encounter: Payer: Self-pay | Admitting: Internal Medicine

## 2021-10-11 ENCOUNTER — Ambulatory Visit (INDEPENDENT_AMBULATORY_CARE_PROVIDER_SITE_OTHER): Payer: Medicare Other | Admitting: Internal Medicine

## 2021-10-11 VITALS — BP 120/76 | HR 85 | Temp 97.8°F | Resp 16 | Ht 72.0 in | Wt 235.2 lb

## 2021-10-11 DIAGNOSIS — C61 Malignant neoplasm of prostate: Secondary | ICD-10-CM | POA: Diagnosis not present

## 2021-10-11 DIAGNOSIS — I4891 Unspecified atrial fibrillation: Secondary | ICD-10-CM

## 2021-10-11 DIAGNOSIS — C775 Secondary and unspecified malignant neoplasm of intrapelvic lymph nodes: Secondary | ICD-10-CM

## 2021-10-11 DIAGNOSIS — N184 Chronic kidney disease, stage 4 (severe): Secondary | ICD-10-CM | POA: Diagnosis not present

## 2021-10-11 DIAGNOSIS — E1165 Type 2 diabetes mellitus with hyperglycemia: Secondary | ICD-10-CM

## 2021-10-11 DIAGNOSIS — N179 Acute kidney failure, unspecified: Secondary | ICD-10-CM | POA: Diagnosis not present

## 2021-10-11 NOTE — Progress Notes (Signed)
[facility] [place], [place] [address]  Internal MEDICINE  Office Visit Note  Patient Name: [name]  [id number]  [id number]  Date of Service: 10/11/2021  Transitional care management    Chief Complaint  Patient presents with   Follow-up    Follow up hosp f/u; Kidney failure   Diabetes   Hypertension     HPI Pt is here for recent hospital follow up. Patient is seen after hospital discharge on 10/06/2001 he was admitted on 9/13, this is a transitional management visit. Patient presented with chest pain, ruled out For MI does have chronic atrial fibrillation on Eliquis and metoprolol, patient was also found to be in nonsustained V. tach his electrolyte was replaced and no further intervention was needed.. Patient was found to have worsening of kidney functions, baseline creatinine is 2.16-2.3 however on admission his creatinine was 4 patient was hydrated with his Torsemide and amlodipine /benazepril was held his creatinine improved to 2.04. Recent hemoglobin A1c 6.8, Rybelsus was stopped patient is to continue on Farxiga 10 mg once a day Patient has history of prostate cancer which is metastatic found to have enlarged retroperitoneal lymph nodes on CT Patient denies any complaints today his medical patients are reviewed some of the medicines are missing he is also not following discharge instructions Current Medication: Outpatient Encounter Medications as of 10/11/2021  Medication Sig   albuterol (VENTOLIN HFA) 108 (90 Base) MCG/ACT inhaler Inhale 2 puffs into the lungs every 4 (four) hours as needed for wheezing or shortness of breath.   apixaban (ELIQUIS) 2.5 MG TABS tablet Take 2.5 mg by mouth 2 (two) times daily.   atorvastatin (LIPITOR) 10 MG tablet TAKE 1 TABLET(10 MG) BY MOUTH DAILY   FARXIGA 10 MG TABS tablet TAKE 1 TABLET(10 MG) BY MOUTH DAILY BEFORE BREAKFAST   ferrous sulfate 325 (65 FE) MG tablet Take 325 mg by mouth daily.    glucose  blood (ONETOUCH VERIO) test strip USE ONCE DAILY. DX E11.65.   metoprolol tartrate 37.5 MG TABS Take 37.5 mg by mouth 2 (two) times daily.   pantoprazole (PROTONIX) 40 MG tablet TAKE 1 TABLET(40 MG) BY MOUTH DAILY   tamsulosin (FLOMAX) 0.4 MG CAPS capsule Take 1 capsule (0.4 mg total) by mouth daily.   Tiotropium Bromide Monohydrate (SPIRIVA RESPIMAT) 2.5 MCG/ACT AERS Inhale 2 puffs into the lungs daily.   vitamin B-12 (CYANOCOBALAMIN) 1000 MCG tablet Take 1 tablet (1,000 mcg total) by mouth daily.   No facility-administered encounter medications on file as of 10/11/2021.    Surgical History: Past Surgical History:  Procedure Laterality Date   CATARACT EXTRACTION     COLONOSCOPY WITH PROPOFOL N/A 07/02/2019   Procedure: COLONOSCOPY WITH PROPOFOL;  Surgeon: Lin Landsman, MD;  Location: [facility] ENDOSCOPY;  Service: Gastroenterology;  Laterality: N/A;   IR CT HEAD LTD  08/27/2019   IR PERCUTANEOUS ART THROMBECTOMY/INFUSION INTRACRANIAL INC DIAG ANGIO  08/27/2019       IR PERCUTANEOUS ART THROMBECTOMY/INFUSION INTRACRANIAL INC DIAG ANGIO  08/27/2019   LOWER EXTREMITY ANGIOGRAPHY Left 12/25/2020   Procedure: Lower Extremity Angiography;  Surgeon: Algernon Huxley, MD;  Location: [facility] CV LAB;  Service: Cardiovascular;  Laterality: Left;   PACEMAKER LEADLESS INSERTION N/A 12/19/2020   Procedure: PACEMAKER LEADLESS INSERTION;  Surgeon: Isaias Cowman, MD;  Location: [facility] CV LAB;  Service: Cardiovascular;  Laterality: N/A;   PROSTATE CANCER     PROSTATE SURGERY     RADIOLOGY WITH ANESTHESIA N/A 08/27/2019  Procedure: IR WITH ANESTHESIA;  Surgeon: Luanne Bras, MD;  Location: [facility];  Service: Radiology;  Laterality: N/A;    Medical History: Past Medical History:  Diagnosis Date   Atrial fibrillation (HCC)    CHF (congestive heart failure) ([place])    Diabetes ([place])    Hearing loss    High blood pressure    History of bladder problems    Kidney stones 11/22/2014    Prostate cancer ([place])    Prostate cancer ([place])    Stroke ([place])     Family History: Family History  Problem Relation Age of Onset   Colon cancer Mother    Diabetes Other    High blood pressure Other    Prostate cancer Brother    Diabetes Brother     Social History   Socioeconomic History   Marital status: Married    Spouse name: Luisangel Wainright   Number of children: 2   Years of education: 12   Highest education level: 12th grade  Occupational History   Occupation: [employer]  Tobacco Use   Smoking status: Former    Years: 16.00    Types: Cigarettes   Smokeless tobacco: Never  Vaping Use   Vaping Use: Never used  Substance and Sexual Activity   Alcohol use: No   Drug use: No   Sexual activity: Not Currently  Other Topics Concern   Not on file  Social History Narrative   Not on file   Social Determinants of Health   Financial Resource Strain: Low Risk  (10/29/2019)   Overall Financial Resource Strain (CARDIA)    Difficulty of Paying Living Expenses: Not very hard  Recent Concern: Financial Resource Strain - Medium Risk (09/08/2019)   Overall Financial Resource Strain (CARDIA)    Difficulty of Paying Living Expenses: Somewhat hard  Food Insecurity: No Food Insecurity (10/04/2021)   Hunger Vital Sign    Worried About Running Out of Food in the Last Year: Never true    Ran Out of Food in the Last Year: Never true  Transportation Needs: No Transportation Needs (10/04/2021)   PRAPARE - Hydrologist (Medical): No    Lack of Transportation (Non-Medical): No  Physical Activity: Inactive (10/29/2019)   Exercise Vital Sign    Days of Exercise per Week: 0 days    Minutes of Exercise per Session: 0 min  Stress: No Stress Concern Present (10/29/2019)   [place]    Feeling of Stress : Only a little  Recent Concern: Stress - Stress Concern Present (09/08/2019)   [place]    Feeling of Stress : To some extent  Social Connections: Moderately Integrated (10/29/2019)   Social Connection and Isolation Panel [NHANES]    Frequency of Communication with Friends and Family: More than three times a week    Frequency of Social Gatherings with Friends and Family: More than three times a week    Attends Religious Services: More than 4 times per year    Active Member of [employer] or Organizations: No    Attends Archivist Meetings: Never    Marital Status: Married  Human resources officer Violence: Not At Risk (10/04/2021)   Humiliation, Afraid, Rape, and Kick questionnaire    Fear of Current or Ex-Partner: No    Emotionally Abused: No    Physically Abused: No    Sexually Abused: No      Review  of Systems  Constitutional:  Negative for chills, fatigue and unexpected weight change.  HENT:  Negative for congestion, postnasal drip, rhinorrhea, sneezing and sore throat.   Eyes:  Negative for redness.  Respiratory:  Negative for cough, chest tightness and shortness of breath.   Cardiovascular:  Negative for chest pain and palpitations.  Gastrointestinal:  Negative for abdominal pain, constipation, diarrhea, nausea and vomiting.  Genitourinary:  Negative for dysuria and frequency.  Musculoskeletal:  Negative for arthralgias, back pain, joint swelling and neck pain.  Skin:  Negative for rash.  Neurological: Negative.  Negative for tremors and numbness.  Hematological:  Negative for adenopathy. Does not bruise/bleed easily.  Psychiatric/Behavioral:  Negative for behavioral problems (Depression), sleep disturbance and suicidal ideas. The patient is not nervous/anxious.     Vital Signs: BP 120/76   Pulse 85   Temp 97.8 F (36.6 C)   Resp 16   Ht 6' (1.829 m)   Wt 235 lb 3.2 oz (106.7 kg)   SpO2 100%   BMI 31.90 kg/m    Physical Exam Constitutional:      Appearance: Normal appearance.  HENT:     Head:  Normocephalic and atraumatic.     Nose: Nose normal.     Mouth/Throat:     Mouth: Mucous membranes are moist.     Pharynx: No posterior oropharyngeal erythema.  Eyes:     Extraocular Movements: Extraocular movements intact.     Pupils: Pupils are equal, round, and reactive to light.  Cardiovascular:     Rate and Rhythm: Rhythm irregular.     Pulses: Normal pulses.     Heart sounds: Normal heart sounds.  Pulmonary:     Effort: Pulmonary effort is normal.     Breath sounds: Normal breath sounds.  Neurological:     General: No focal deficit present.     Mental Status: He is alert.  Psychiatric:        Mood and Affect: Mood normal.        Behavior: Behavior normal.       Assessment/Plan: 1. AKI (acute kidney injury) ([place]) Patient is instructed to DC his torsemide, all medications were reviewed with him - Basic Metabolic Panel (BMET)  2. CKD (chronic kidney disease), stage IV (HCC) Hold amlodipine/benazepril for now, continue on follow-up with nephrology, patient is taking metoprolol 37.5 mg twice a day  3. Type 2 diabetes mellitus with hyperglycemia, without long-term current use of insulin (HCC) Increase Rybelsus continue Farxiga 10 mg once a day.  Hemoglobin A1c is at target  4. Malignant neoplasm of prostate metastatic to intrapelvic lymph node ([place]) Followed by urology patient has multiple scans scheduled he was also given Flomax 0.4 mg once a day  5. Atrial fibrillation with RVR ([place]) Patient is to continue metoprolol 37.5 mg twice a day, will check basic metabolic panel and if needed will restart on torsemide patient is also on Eliquis  General Counseling: Tia Masker understanding of the findings of todays visit and agrees with plan of treatment. I have discussed any further diagnostic evaluation that may be needed or ordered today. We also reviewed his medications today. he has been encouraged to call the office with any questions or concerns that should arise  related to todays visit.    Counseling:  [place] Controlled Substance Database was reviewed by me.  Orders Placed This Encounter  Procedures   Basic Metabolic Panel (BMET)      I have reviewed all medical records from hospital follow up  including radiology reports and consults from other physicians. Appropriate follow up diagnostics will be scheduled as needed. Patient/ Family understands the plan of treatment. Time spent45 minutes.   Dr Lavera Guise, MD Internal Medicine

## 2021-10-12 LAB — BASIC METABOLIC PANEL
BUN/Creatinine Ratio: 17 (ref 10–24)
BUN: 46 mg/dL — ABNORMAL HIGH (ref 8–27)
CO2: 22 mmol/L (ref 20–29)
Calcium: 10.2 mg/dL (ref 8.6–10.2)
Chloride: 101 mmol/L (ref 96–106)
Creatinine, Ser: 2.76 mg/dL — ABNORMAL HIGH (ref 0.76–1.27)
Glucose: 73 mg/dL (ref 70–99)
Potassium: 4.4 mmol/L (ref 3.5–5.2)
Sodium: 144 mmol/L (ref 134–144)
eGFR: 22 mL/min/{1.73_m2} — ABNORMAL LOW (ref >59)

## 2021-10-15 NOTE — Progress Notes (Signed)
Labs reviewed, pt is off of Demadex

## 2021-10-16 ENCOUNTER — Ambulatory Visit
Admission: RE | Admit: 2021-10-16 | Discharge: 2021-10-16 | Disposition: A | Discharge status: Home / Self Care | Payer: Medicare Other | Source: Ambulatory Visit | Attending: Oncology | Admitting: Oncology

## 2021-10-16 ENCOUNTER — Encounter
Admission: RE | Admit: 2021-10-16 | Discharge: 2021-10-16 | Disposition: A | Discharge status: Home / Self Care | Payer: Medicare Other | Source: Ambulatory Visit | Attending: Oncology | Admitting: Oncology

## 2021-10-16 DIAGNOSIS — Z87448 Personal history of other diseases of urinary system: Secondary | ICD-10-CM | POA: Diagnosis not present

## 2021-10-16 DIAGNOSIS — C61 Malignant neoplasm of prostate: Secondary | ICD-10-CM | POA: Insufficient documentation

## 2021-10-16 DIAGNOSIS — N200 Calculus of kidney: Secondary | ICD-10-CM | POA: Diagnosis not present

## 2021-10-16 DIAGNOSIS — J984 Other disorders of lung: Secondary | ICD-10-CM | POA: Diagnosis not present

## 2021-10-16 DIAGNOSIS — E119 Type 2 diabetes mellitus without complications: Secondary | ICD-10-CM | POA: Diagnosis not present

## 2021-10-16 DIAGNOSIS — K5730 Diverticulosis of large intestine without perforation or abscess without bleeding: Secondary | ICD-10-CM | POA: Diagnosis not present

## 2021-10-16 MED ORDER — TECHNETIUM TC 99M MEDRONATE IV KIT
20.0000 | PACK | Freq: Once | INTRAVENOUS | Status: AC | PRN
  Administered 2021-10-16: 22.39 via INTRAVENOUS

## 2021-10-19 ENCOUNTER — Other Ambulatory Visit (HOSPITAL_COMMUNITY): Payer: Self-pay

## 2021-10-19 ENCOUNTER — Telehealth: Payer: Self-pay

## 2021-10-19 ENCOUNTER — Encounter: Payer: Self-pay | Admitting: Oncology

## 2021-10-19 ENCOUNTER — Inpatient Hospital Stay: Payer: Medicare Other | Attending: Oncology

## 2021-10-19 ENCOUNTER — Telehealth: Payer: Self-pay | Admitting: Pharmacist

## 2021-10-19 ENCOUNTER — Inpatient Hospital Stay (HOSPITAL_BASED_OUTPATIENT_CLINIC_OR_DEPARTMENT_OTHER): Payer: Medicare Other | Admitting: Oncology

## 2021-10-19 DIAGNOSIS — C61 Malignant neoplasm of prostate: Secondary | ICD-10-CM | POA: Diagnosis present

## 2021-10-19 DIAGNOSIS — N184 Chronic kidney disease, stage 4 (severe): Secondary | ICD-10-CM | POA: Diagnosis not present

## 2021-10-19 DIAGNOSIS — I480 Paroxysmal atrial fibrillation: Secondary | ICD-10-CM | POA: Insufficient documentation

## 2021-10-19 DIAGNOSIS — Z7901 Long term (current) use of anticoagulants: Secondary | ICD-10-CM | POA: Insufficient documentation

## 2021-10-19 DIAGNOSIS — I4891 Unspecified atrial fibrillation: Secondary | ICD-10-CM | POA: Insufficient documentation

## 2021-10-19 DIAGNOSIS — I4811 Longstanding persistent atrial fibrillation: Secondary | ICD-10-CM

## 2021-10-19 DIAGNOSIS — Z79899 Other long term (current) drug therapy: Secondary | ICD-10-CM | POA: Diagnosis not present

## 2021-10-19 DIAGNOSIS — C772 Secondary and unspecified malignant neoplasm of intra-abdominal lymph nodes: Secondary | ICD-10-CM | POA: Insufficient documentation

## 2021-10-19 DIAGNOSIS — E1122 Type 2 diabetes mellitus with diabetic chronic kidney disease: Secondary | ICD-10-CM | POA: Insufficient documentation

## 2021-10-19 DIAGNOSIS — R590 Localized enlarged lymph nodes: Secondary | ICD-10-CM | POA: Diagnosis not present

## 2021-10-19 LAB — COMPREHENSIVE METABOLIC PANEL
ALT: 34 U/L (ref 0–44)
AST: 31 U/L (ref 15–41)
Albumin: 3.7 g/dL (ref 3.5–5.0)
Alkaline Phosphatase: 118 U/L (ref 38–126)
Anion gap: 5 (ref 5–15)
BUN: 36 mg/dL — ABNORMAL HIGH (ref 8–23)
CO2: 27 mmol/L (ref 22–32)
Calcium: 9.3 mg/dL (ref 8.9–10.3)
Chloride: 108 mmol/L (ref 98–111)
Creatinine, Ser: 2.15 mg/dL — ABNORMAL HIGH (ref 0.61–1.24)
GFR, Estimated: 29 mL/min — ABNORMAL LOW (ref >60)
Glucose, Bld: 162 mg/dL — ABNORMAL HIGH (ref 70–99)
Potassium: 4.5 mmol/L (ref 3.5–5.1)
Sodium: 140 mmol/L (ref 135–145)
Total Bilirubin: 0.6 mg/dL (ref 0.3–1.2)
Total Protein: 7.7 g/dL (ref 6.5–8.1)

## 2021-10-19 LAB — CBC WITH DIFFERENTIAL/PLATELET
Abs Immature Granulocytes: 0.01 10*3/uL (ref 0.00–0.07)
Basophils Absolute: 0 10*3/uL (ref 0.0–0.1)
Basophils Relative: 0 %
Eosinophils Absolute: 0.1 10*3/uL (ref 0.0–0.5)
Eosinophils Relative: 3 %
HCT: 46.2 % (ref 39.0–52.0)
Hemoglobin: 14.8 g/dL (ref 13.0–17.0)
Immature Granulocytes: 0 %
Lymphocytes Relative: 32 %
Lymphs Abs: 1.5 10*3/uL (ref 0.7–4.0)
MCH: 29.2 pg (ref 26.0–34.0)
MCHC: 32 g/dL (ref 30.0–36.0)
MCV: 91.3 fL (ref 80.0–100.0)
Monocytes Absolute: 0.6 10*3/uL (ref 0.1–1.0)
Monocytes Relative: 12 %
Neutro Abs: 2.5 10*3/uL (ref 1.7–7.7)
Neutrophils Relative %: 53 %
Platelets: 142 10*3/uL — ABNORMAL LOW (ref 150–400)
RBC: 5.06 MIL/uL (ref 4.22–5.81)
RDW: 15 % (ref 11.5–15.5)
WBC: 4.7 10*3/uL (ref 4.0–10.5)
nRBC: 0 % (ref 0.0–0.2)

## 2021-10-19 LAB — PSA: Prostatic Specific Antigen: 132.13 ng/mL — ABNORMAL HIGH (ref 0.00–4.00)

## 2021-10-19 NOTE — Assessment & Plan Note (Addendum)
rostate cancer recurrence.  Non- regional (retroperitoneal) lymphadenopathy -M1a Labs reviewed and discussed with patient.  PSA progressively trending up to 83. 01/05/2021 Bone scan showed No convincing scintigraphic evidence of bony metastatic disease. 01/05/2021 Mild increase in size of left retroperitoneal, right common iliac and right external iliac lymph nodes compatible with progression of disease.No CT findings of osseous metastasis. Patient is on androgen deprivation therapy with Eligard 45 mg every 6 months.   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  Today we viewed the CT scan which showed further disease progression - extensive retroperitoneum and pelvis. Patient now is interested in treatments. We discussed in details about the rationale and potential side effects of androgen receptor antagonist. Patient and wife agree with the plan.  Will check insurance coverage. I prefer Xtandi than Zytiga due to his extensive cardiovascular disease history. Patient is on Eliquis 2.'5mg'$  BID and metabolism maybe increased by Xtandi.  Will need to further discuss with cardiology regarding switching to other anticoagulation agent, ie Dabigatran, Edoxaban etc.

## 2021-10-19 NOTE — Assessment & Plan Note (Addendum)
Patient is on beta blocker and Eliquis 2.'5mg'$  BID.  Will discuss with cardiology to see if Eliquis could be switched to Edoxaban, or Dabigatran

## 2021-10-19 NOTE — Telephone Encounter (Signed)
Oral Oncology Pharmacist Encounter  Received new prescription for Xtandi (enzalutamide) for the treatment of metastatic castration resistant prostate cancer in conjunction with ADT, planned duration until disease progression or unacceptable drug toxicity.  Prescription dose and frequency assessed.   Current medication list in Epic reviewed, a few DDI(s) with enzalutamide identified: Atorvastatin: enzalutamide may decrease the serum concentration of atorvastatin. Monitor for decreased atorvastatin effects. No baseline dose adjustments needed. **There was an interaction with apixaban, but Dr. Tasia Catchings spoke with the patient's cardiologist and they agreed to switch patient to edoxaban. Edoxaban and enzalutamide only have a category C interaction (monitor therapy). Enzalutamide may increase the serum concentration of edoxaban. Monitor for increased edoxaban effects and toxicities (ie, bleeding).  Evaluated chart and no patient barriers to medication adherence identified.   Prescription has been e-scribed to the [facility] for benefits analysis and approval.  Oral Oncology Clinic will continue to follow for insurance authorization, copayment issues, initial counseling and start date.  Darl Pikes, PharmD, BCPS, BCOP, CPP Hematology/Oncology Clinical Pharmacist Practitioner [place] [phone]  10/19/2021 11:15 AM

## 2021-10-19 NOTE — Telephone Encounter (Signed)
Oral Oncology Patient Advocate Encounter   Received notification that prior authorization for [facility] is required.   PA submitted on 09.29.23  Key [id number]  Status is pending     Berdine Addison, [place] [facility] [place]  [phone] (phone) [phone] (fax)

## 2021-10-19 NOTE — Assessment & Plan Note (Signed)
Encourage oral hydration and avoid nephrotoxins.   

## 2021-10-19 NOTE — Telephone Encounter (Signed)
Oral Oncology Patient Advocate Encounter  Prior Authorization for [facility]erm[facility]y has been approved.    PA#  [id number]  Effective dates: 09.29.23 through 12.31.23  Patients co-pay is $10.35.    Berdine Addison, [facility] Patient [place]  [phone] (phone) [phone] (fax)

## 2021-10-19 NOTE — Progress Notes (Signed)
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 evidence of acute diverticulitis at this time. 7. Additional incidental findings, similar to prior studies, as above. Electronically Signed   By: Vinnie Langton M.D.   On: 10/17/2021 06:47   CT CHEST ABDOMEN PELVIS WO CONTRAST  Result Date: 10/03/2021 CLINICAL DATA:  Flank pain; kidney stone suspected; Substernal chest pain EXAM: CT CHEST, ABDOMEN AND PELVIS WITHOUT CONTRAST TECHNIQUE: Multidetector CT imaging of the chest, abdomen and pelvis was performed following the standard protocol without IV contrast. RADIATION DOSE REDUCTION: This exam was performed according to the  departmental dose-optimization program which includes automated exposure control, adjustment of the mA and/or kV according to patient size and/or use of iterative reconstruction technique. COMPARISON:  CT 01/05/2021 FINDINGS: CT CHEST FINDINGS Cardiovascular: Aortic and coronary artery atherosclerotic calcification. Cardiomegaly.  Leadless pacer. Mediastinum/Nodes: No thoracic adenopathy by size. Unchanged prominent prevascular and mediastinal nodes measuring up to 8 mm. Lungs/Pleura: Subpleural reticulation. No pleural effusion or pneumothorax. Scarring/atelectasis in the lingula. The airways are clear. Musculoskeletal: No chest wall mass or suspicious bone lesions identified. CT ABDOMEN PELVIS FINDINGS Hepatobiliary: Exophytic subscapular lesion along the right lateral hepatic lobe again measures 4.4 cm. Multiple additional low-density liver lesions are unchanged and likely represent benign cysts. Pancreas: Unremarkable. No pancreatic ductal dilatation or surrounding inflammatory changes. Spleen: Normal in size without focal abnormality. Adrenals/Urinary Tract: Multiple nonobstructing calyceal stones bilaterally. Bilateral cortical renal scarring and parenchymal calcifications. No obstructing urinary calculi or hydronephrosis. The bladder is decompressed. Unremarkable adrenal glands. Stomach/Bowel: Stomach is unremarkable. Normal caliber large and small bowel. Colonic diverticulosis without diverticulitis. Normal appendix. Vascular/Lymphatic: Aortic atherosclerotic calcification with mild dilation of the infrarenal abdominal aorta measuring 3.3 cm. Multiple enlarged retroperitoneal and iliac lymph nodes have increased in size. For example a left periaortic node now measures 2.3 cm, previously 1.1 cm (series 2/image 76). A right pericaval lymph node measures 2.5 cm, previously 1.9 cm (series 2/image 88. A right external iliac node measures 2.7 cm, previously 2.2 cm (series 2/image 98). Reproductive: Prostatectomy. Other: Indeterminate predominantly fat density lesion in the anterior pelvis (series 2/image 93 measuring 2.2 cm, previously 0.7 cm. No free intraperitoneal fluid or air. Musculoskeletal: No acute osseous abnormality. IMPRESSION: No finding in the chest, abdomen or pelvis to explain substernal chest pain or flank pain. Increased  size of multiple retroperitoneal lymph nodes compatible with progression of disease. Unchanged exophytic subscapular lesion in the right lateral hepatic lobe measuring 4.4 cm. Nonobstructing bilateral nephrolithiasis. Coronary artery atherosclerotic calcification. Cardiomegaly. Aortic Atherosclerosis (ICD10-I70.0). 3.3 cm infrarenal abdominal aortic aneurysm Recommend follow-up ultrasound every 3 years. This recommendation follows ACR consensus guidelines: White Paper of the ACR Incidental Findings Committee II on Vascular Findings. J Am Coll Radiol 2013; 10:789-794. Electronically Signed   By: Placido Sou M.D.   On: 10/03/2021 23:51   DG Chest 1 View  Result Date: 10/03/2021 CLINICAL DATA:  Substernal chest pain EXAM: CHEST  1 VIEW COMPARISON:  08/27/2019, 08/03/2021 FINDINGS: Cardiomegaly. Subsegmental atelectasis or scar at the left base. No acute airspace disease or effusion. No pneumothorax. IMPRESSION: No active disease.  Cardiomegaly Electronically Signed   By: Donavan Foil M.D.   On: 10/03/2021 21:34

## 2021-10-20 DIAGNOSIS — G4733 Obstructive sleep apnea (adult) (pediatric): Secondary | ICD-10-CM | POA: Diagnosis not present

## 2021-10-24 ENCOUNTER — Telehealth: Payer: Self-pay

## 2021-10-24 NOTE — Telephone Encounter (Signed)
Left message with front office. Also left Dr. Collie Siad Ph number if Dr. [name] would like to communicate with her directly.

## 2021-10-24 NOTE — Telephone Encounter (Signed)
-----   Message from Earlie Server, MD sent at 10/24/2021  8:38 AM EDT ----- Benjamine Mola,  Would you please contact his cardiologist office to see if he can be switched to edoxaban for A fib control as Eliquis interacts with his future prostate cancer treatment. Thanks  Zhou  ----- Message ----- From: Darl Pikes, RPH-CPP Sent: 10/22/2021  11:10 AM EDT To: Earlie Server, MD  So, they have him on an off label reduced dose for the apixaban afib indication. The equivalent to dosing the same way for edoxaban would be reducing from the normal dose of '60mg'$  daily to '30mg'$  daily.  -Alyson  ----- Message ----- From: Earlie Server, MD Sent: 10/19/2021   8:51 PM EDT To: Darl Pikes, RPH-CPP  This patient had been reluctant in initiating oral agents and finally agreed today, still skeptical to some extent. I do not feel comfortable about off label use in his case.  He is on Eliquis 2.'5mg'$  BID, for A fib/stroke prevention.  I wonder if he could be switched to Edoxaban, Dargatroban, to be used together [employer]	[place]. What dose will be equivalent dose to Eliquis 2.'5mg'$  BID? Appreciate your thoughts. If feasible, I will talk to his cardiologist.

## 2021-10-26 ENCOUNTER — Ambulatory Visit (INDEPENDENT_AMBULATORY_CARE_PROVIDER_SITE_OTHER): Payer: Medicare Other | Admitting: Nurse Practitioner

## 2021-10-26 ENCOUNTER — Encounter: Payer: Self-pay | Admitting: Nurse Practitioner

## 2021-10-26 VITALS — BP 123/73 | HR 79 | Temp 97.3°F | Resp 16 | Ht 72.0 in | Wt 246.8 lb

## 2021-10-26 DIAGNOSIS — C775 Secondary and unspecified malignant neoplasm of intrapelvic lymph nodes: Secondary | ICD-10-CM

## 2021-10-26 DIAGNOSIS — N184 Chronic kidney disease, stage 4 (severe): Secondary | ICD-10-CM

## 2021-10-26 DIAGNOSIS — E1165 Type 2 diabetes mellitus with hyperglycemia: Secondary | ICD-10-CM | POA: Diagnosis not present

## 2021-10-26 DIAGNOSIS — I4891 Unspecified atrial fibrillation: Secondary | ICD-10-CM

## 2021-10-26 DIAGNOSIS — C61 Malignant neoplasm of prostate: Secondary | ICD-10-CM

## 2021-10-26 DIAGNOSIS — Z23 Encounter for immunization: Secondary | ICD-10-CM

## 2021-10-26 NOTE — Progress Notes (Signed)
[facility] [place], [place] [address]  Internal MEDICINE  Office Visit Note  Patient Name: [name]  [id number]  [id number]  Date of Service: 10/26/2021  Chief Complaint  Patient presents with   Follow-up    Diabetes, medication review    HPI [name] presents for a follow-up visit for diabetes, high cholesterol, prostate cancer and atrial fibrillation.  We will review medications as well. Diabetes --his last A1c was 6.8, it is too early to check his A1c today.  Continues to work on diet modifications.  His Rybelsus was discontinued during his last hospitalization.  He is still taking [place].  High cholesterol --continues to take atorvastatin Prostate cancer --managed by oncology, Dr. Tasia Catchings.  They are planning to start the patient on Xtandi and have been communicating with his cardiologist to switch his blood thinner to argatroban or something else because apixaban interacts with Xtandi.  He had labs drawn at the cancer center recently and his PSA level is greater than 130.  Atrial fibrillation rate controlled with metoprolol and on anticoagulant, currently apixaban but will most likely switch to a different blood thinner via his cardiologist office Diet modifications --patient is getting sick of eating just chicken.  Discussed alternative sources of protein in detail with the patient as well as other diet modifications that will benefit the patient in regards to his medical conditions   Current Medication: Outpatient Encounter Medications as of 10/26/2021  Medication Sig   albuterol (VENTOLIN HFA) 108 (90 Base) MCG/ACT inhaler Inhale 2 puffs into the lungs every 4 (four) hours as needed for wheezing or shortness of breath.   apixaban (ELIQUIS) 2.5 MG TABS tablet Take 2.5 mg by mouth 2 (two) times daily.   atorvastatin (LIPITOR) 10 MG tablet TAKE 1 TABLET(10 MG) BY MOUTH DAILY   FARXIGA 10 MG TABS tablet TAKE 1 TABLET(10 MG) BY MOUTH DAILY BEFORE BREAKFAST    ferrous sulfate 325 (65 FE) MG tablet Take 325 mg by mouth daily.    glucose blood (ONETOUCH VERIO) test strip USE ONCE DAILY. DX E11.65.   metoprolol tartrate 37.5 MG TABS Take 37.5 mg by mouth 2 (two) times daily.   pantoprazole (PROTONIX) 40 MG tablet TAKE 1 TABLET(40 MG) BY MOUTH DAILY   tamsulosin (FLOMAX) 0.4 MG CAPS capsule Take 1 capsule (0.4 mg total) by mouth daily.   Tiotropium Bromide Monohydrate (SPIRIVA RESPIMAT) 2.5 MCG/ACT AERS Inhale 2 puffs into the lungs daily.   vitamin B-12 (CYANOCOBALAMIN) 1000 MCG tablet Take 1 tablet (1,000 mcg total) by mouth daily.   No facility-administered encounter medications on file as of 10/26/2021.    Surgical History: Past Surgical History:  Procedure Laterality Date   CATARACT EXTRACTION     COLONOSCOPY WITH PROPOFOL N/A 07/02/2019   Procedure: COLONOSCOPY WITH PROPOFOL;  Surgeon: Lin Landsman, MD;  Location: [facility] ENDOSCOPY;  Service: Gastroenterology;  Laterality: N/A;   IR CT HEAD LTD  08/27/2019   IR PERCUTANEOUS ART THROMBECTOMY/INFUSION INTRACRANIAL INC DIAG ANGIO  08/27/2019       IR PERCUTANEOUS ART THROMBECTOMY/INFUSION INTRACRANIAL INC DIAG ANGIO  08/27/2019   LOWER EXTREMITY ANGIOGRAPHY Left 12/25/2020   Procedure: Lower Extremity Angiography;  Surgeon: Algernon Huxley, MD;  Location: [place] CV LAB;  Service: Cardiovascular;  Laterality: Left;   PACEMAKER LEADLESS INSERTION N/A 12/19/2020   Procedure: PACEMAKER LEADLESS INSERTION;  Surgeon: Isaias Cowman, MD;  Location: [place] CV LAB;  Service: Cardiovascular;  Laterality: N/A;   PROSTATE CANCER  PROSTATE SURGERY     RADIOLOGY WITH ANESTHESIA N/A 08/27/2019   Procedure: IR WITH ANESTHESIA;  Surgeon: Luanne Bras, MD;  Location: [place];  Service: Radiology;  Laterality: N/A;    Medical History: Past Medical History:  Diagnosis Date   Atrial fibrillation (HCC)    CHF (congestive heart failure) ([place])    Diabetes ([place])    Hearing loss    High  blood pressure    History of bladder problems    Kidney stones 11/22/2014   Prostate cancer ([place])    Prostate cancer ([place])    Stroke ([place])     Family History: Family History  Problem Relation Age of Onset   Colon cancer Mother    Diabetes Other    High blood pressure Other    Prostate cancer Brother    Diabetes Brother     Social History   Socioeconomic History   Marital status: Married    Spouse name: [name]   Number of children: 2   Years of education: 12   Highest education level: 12th grade  Occupational History   Occupation: [employer]  Tobacco Use   Smoking status: Former    Years: 16.00    Types: Cigarettes   Smokeless tobacco: Never  Vaping Use   Vaping Use: Never used  Substance and Sexual Activity   Alcohol use: No   Drug use: No   Sexual activity: Not Currently  Other Topics Concern   Not on file  Social History Narrative   Not on file   Social Determinants of Health   Financial Resource Strain: Low Risk  (10/29/2019)   Overall Financial Resource Strain (CARDIA)    Difficulty of Paying Living Expenses: Not very hard  Recent Concern: Financial Resource Strain - Medium Risk (09/08/2019)   Overall Financial Resource Strain (CARDIA)    Difficulty of Paying Living Expenses: Somewhat hard  Food Insecurity: No Food Insecurity (10/04/2021)   Hunger Vital Sign    Worried About Running Out of Food in the Last Year: Never true    Ran Out of Food in the Last Year: Never true  Transportation Needs: No Transportation Needs (10/04/2021)   PRAPARE - Hydrologist (Medical): No    Lack of Transportation (Non-Medical): No  Physical Activity: Inactive (10/29/2019)   Exercise Vital Sign    Days of Exercise per Week: 0 days    Minutes of Exercise per Session: 0 min  Stress: No Stress Concern Present (10/29/2019)   [place]    Feeling of Stress : Only a little  Recent  Concern: Stress - Stress Concern Present (09/08/2019)   [place]    Feeling of Stress : To some extent  Social Connections: Moderately Integrated (10/29/2019)   Social Connection and Isolation Panel [NHANES]    Frequency of Communication with Friends and Family: More than three times a week    Frequency of Social Gatherings with Friends and Family: More than three times a week    Attends Religious Services: More than 4 times per year    Active Member of [employer] or Organizations: No    Attends Archivist Meetings: Never    Marital Status: Married  Human resources officer Violence: Not At Risk (10/04/2021)   Humiliation, Afraid, Rape, and Kick questionnaire    Fear of Current or Ex-Partner: No    Emotionally Abused: No    Physically Abused:  No    Sexually Abused: No      Review of Systems  Constitutional:  Negative for chills, fatigue and unexpected weight change.  HENT:  Negative for congestion, postnasal drip, rhinorrhea, sneezing and sore throat.   Eyes:  Negative for redness.  Respiratory:  Negative for cough, chest tightness and shortness of breath.   Cardiovascular:  Negative for chest pain and palpitations.  Gastrointestinal:  Negative for abdominal pain, constipation, diarrhea, nausea and vomiting.  Genitourinary:  Negative for dysuria and frequency.  Musculoskeletal:  Negative for arthralgias, back pain, joint swelling and neck pain.  Skin:  Negative for rash.  Neurological: Negative.  Negative for tremors and numbness.  Hematological:  Negative for adenopathy. Does not bruise/bleed easily.  Psychiatric/Behavioral:  Negative for behavioral problems (Depression), sleep disturbance and suicidal ideas. The patient is not nervous/anxious.     Vital Signs: BP 123/73   Pulse 79   Temp (!) 97.3 F (36.3 C)   Resp 16   Ht 6' (1.829 m)   Wt 246 lb 12.8 oz (111.9 kg)   SpO2 95%   BMI 33.47 kg/m    Physical  Exam Constitutional:      Appearance: Normal appearance.  HENT:     Head: Normocephalic and atraumatic.     Nose: Nose normal.     Mouth/Throat:     Mouth: Mucous membranes are moist.     Pharynx: No posterior oropharyngeal erythema.  Eyes:     Extraocular Movements: Extraocular movements intact.     Pupils: Pupils are equal, round, and reactive to light.  Cardiovascular:     Rate and Rhythm: Rhythm irregular.     Pulses: Normal pulses.     Heart sounds: Normal heart sounds.  Pulmonary:     Effort: Pulmonary effort is normal.     Breath sounds: Normal breath sounds.  Neurological:     General: No focal deficit present.     Mental Status: He is alert.  Psychiatric:        Mood and Affect: Mood normal.        Behavior: Behavior normal.        Assessment/Plan: 1. Atrial fibrillation with RVR (HCC) Rate controlled and on anticoagulant, he is waiting to hear from cardiology about which anticoagulant he will be switched to.   2. 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 Tasia Catchings will be starting patient on a new cancer treatment, Xtandi, soon.   3. CKD (chronic kidney disease), stage IV ([place]) Most recent labs show slight improvement in creatinine and A1c  4. Type 2 diabetes mellitus with hyperglycemia, without long-term current use of insulin (HCC) Discussed diet modifications in detail. Will check A1c at next visit  5. Needs flu shot Administered in office today - Flu Vaccine MDCK QUAD PF   General Counseling: Jaxsen verbalizes understanding of the findings of todays visit and agrees with plan of treatment. I have discussed any further diagnostic evaluation that may be needed or ordered today. We also reviewed his medications today. he has been encouraged to call the office with any questions or concerns that should arise related to todays visit.    Orders Placed This Encounter  Procedures   Flu Vaccine MDCK QUAD PF    No orders of  the defined types were placed in this encounter.   Return in 8 weeks (on 12/20/2021) for previously scheduled, F/U, Kendra Woolford PCP.   Total time spent:30 Minutes Time spent includes review of chart, medications,  test results, and follow up plan with the patient.   [place] Controlled Substance Database was reviewed by me.  This patient was seen by Jonetta Osgood, FNP-C in collaboration with Dr. Clayborn Bigness as a part of collaborative care agreement.   Kezia Benevides R. Valetta Fuller, MSN, FNP-C Internal medicine

## 2021-10-30 NOTE — Telephone Encounter (Signed)
Called cardiology office again to follow up. They will relay message to Dr. Wilmer Floor, PA again.

## 2021-10-31 ENCOUNTER — Other Ambulatory Visit (INDEPENDENT_AMBULATORY_CARE_PROVIDER_SITE_OTHER): Payer: Self-pay | Admitting: Nurse Practitioner

## 2021-10-31 DIAGNOSIS — I739 Peripheral vascular disease, unspecified: Secondary | ICD-10-CM

## 2021-11-01 DIAGNOSIS — E782 Mixed hyperlipidemia: Secondary | ICD-10-CM | POA: Diagnosis not present

## 2021-11-01 DIAGNOSIS — I7143 Infrarenal abdominal aortic aneurysm, without rupture: Secondary | ICD-10-CM | POA: Diagnosis not present

## 2021-11-01 DIAGNOSIS — I5032 Chronic diastolic (congestive) heart failure: Secondary | ICD-10-CM | POA: Diagnosis not present

## 2021-11-01 DIAGNOSIS — G4733 Obstructive sleep apnea (adult) (pediatric): Secondary | ICD-10-CM | POA: Diagnosis not present

## 2021-11-01 DIAGNOSIS — I38 Endocarditis, valve unspecified: Secondary | ICD-10-CM | POA: Diagnosis not present

## 2021-11-01 DIAGNOSIS — N1832 Chronic kidney disease, stage 3b: Secondary | ICD-10-CM | POA: Diagnosis not present

## 2021-11-01 DIAGNOSIS — I10 Essential (primary) hypertension: Secondary | ICD-10-CM | POA: Diagnosis not present

## 2021-11-05 ENCOUNTER — Other Ambulatory Visit (HOSPITAL_COMMUNITY): Payer: Self-pay

## 2021-11-05 MED ORDER — ENZALUTAMIDE 40 MG PO TABS
160.0000 mg | ORAL_TABLET | Freq: Every day | ORAL | 2 refills | Status: DC
  Filled 2021-11-05 (×2): qty 120, 30d supply, fill #0
  Filled 2021-11-30: qty 120, 30d supply, fill #1
  Filled 2021-12-26: qty 120, 30d supply, fill #2

## 2021-11-05 NOTE — Telephone Encounter (Signed)
Spoke with patient's wife on 11/05/21. They have not yet picked up the Savasya (edoxaban) from their local pharmacy. He is still taking the apixaban. The change to edoxaban will need to be made before the Xtandi could be started. Patient's wife said several times she was "very nervous".  To help them feel more comfortable, they will be coming in for education and to discuss his medication changes. She plans on calling me back to set-up a time once she speaks to her husband about his work schedule.   She knows the first step to any changes is picking up the edoxaban from the pharmacy.

## 2021-11-06 ENCOUNTER — Ambulatory Visit (INDEPENDENT_AMBULATORY_CARE_PROVIDER_SITE_OTHER): Payer: Medicare Other

## 2021-11-06 ENCOUNTER — Other Ambulatory Visit: Payer: Self-pay | Admitting: Nurse Practitioner

## 2021-11-06 ENCOUNTER — Ambulatory Visit (INDEPENDENT_AMBULATORY_CARE_PROVIDER_SITE_OTHER): Payer: Medicare Other | Admitting: Vascular Surgery

## 2021-11-06 ENCOUNTER — Other Ambulatory Visit (HOSPITAL_COMMUNITY): Payer: Self-pay

## 2021-11-06 DIAGNOSIS — Z9889 Other specified postprocedural states: Secondary | ICD-10-CM | POA: Diagnosis not present

## 2021-11-06 DIAGNOSIS — I739 Peripheral vascular disease, unspecified: Secondary | ICD-10-CM

## 2021-11-06 DIAGNOSIS — K219 Gastro-esophageal reflux disease without esophagitis: Secondary | ICD-10-CM

## 2021-11-08 ENCOUNTER — Other Ambulatory Visit (HOSPITAL_COMMUNITY): Payer: Self-pay

## 2021-11-08 NOTE — Telephone Encounter (Signed)
Spoke with [name], patient's daughter in law. She called at the instruction of [name] to help with the medication changes.   Explained to [name] that [name] is needing to switch from Eliquis to [facility] in order to start the [facility] for his prostate cancer.   She plans on helping them make the switch to the [facility] then calling me back to set-up delivery for the [facility].

## 2021-11-12 ENCOUNTER — Other Ambulatory Visit (HOSPITAL_COMMUNITY): Payer: Self-pay

## 2021-11-12 NOTE — Telephone Encounter (Signed)
Oral Chemotherapy Pharmacist Encounter  Called Tiffany to check in this afternoon, she was able to get Mr. Pantaleo switched over to edoxaban. So he is okay begin enzalutamide. [employer] (Specialty) will deliver medication by 11/14/21 for him to get started  Patient Education I spoke with Tiffany for overview of new oral chemotherapy medication: Xtandi (enzalutamide) for the treatment of metastatic castration resistant prostate cancer in conjunction with ADT, planned duration until disease progression or unacceptable drug toxicity.   Counseled Tiffany on administration, dosing, side effects, monitoring, drug-food interactions, safe handling, storage, and disposal. Patient will take 4 tablets (160 mg total) by mouth daily.  Side effects include but not limited to: fatigue and hypertension.    Reviewed with Tiffany importance of keeping a medication schedule and plan for any missed doses.  After discussion with Tiffany no patient barriers to medication adherence identified.   Tiffany voiced understanding and appreciation. All questions answered. Medication handout provided.  Provided Tiffany with [facility] phone number. Tiffany knows to call the office with questions or concerns. [facility] will continue to follow.  Darl Pikes, PharmD, BCPS, BCOP, CPP Hematology/Oncology Clinical Pharmacist Practitioner [place]/DB/AP [facility] [phone]  11/12/2021 2:31 PM

## 2021-11-13 ENCOUNTER — Emergency Department
Admission: EM | Admit: 2021-11-13 | Discharge: 2021-11-13 | Disposition: A | Discharge status: Home / Self Care | Payer: Medicare Other | Attending: Emergency Medicine | Admitting: Emergency Medicine

## 2021-11-13 ENCOUNTER — Other Ambulatory Visit: Payer: Self-pay

## 2021-11-13 ENCOUNTER — Emergency Department: Payer: Medicare Other

## 2021-11-13 DIAGNOSIS — N184 Chronic kidney disease, stage 4 (severe): Secondary | ICD-10-CM | POA: Insufficient documentation

## 2021-11-13 DIAGNOSIS — J449 Chronic obstructive pulmonary disease, unspecified: Secondary | ICD-10-CM | POA: Diagnosis not present

## 2021-11-13 DIAGNOSIS — I517 Cardiomegaly: Secondary | ICD-10-CM | POA: Diagnosis not present

## 2021-11-13 DIAGNOSIS — Z8546 Personal history of malignant neoplasm of prostate: Secondary | ICD-10-CM | POA: Insufficient documentation

## 2021-11-13 DIAGNOSIS — R0981 Nasal congestion: Secondary | ICD-10-CM | POA: Diagnosis present

## 2021-11-13 DIAGNOSIS — I130 Hypertensive heart and chronic kidney disease with heart failure and stage 1 through stage 4 chronic kidney disease, or unspecified chronic kidney disease: Secondary | ICD-10-CM | POA: Insufficient documentation

## 2021-11-13 DIAGNOSIS — U071 COVID-19: Secondary | ICD-10-CM | POA: Insufficient documentation

## 2021-11-13 DIAGNOSIS — M5134 Other intervertebral disc degeneration, thoracic region: Secondary | ICD-10-CM | POA: Diagnosis not present

## 2021-11-13 DIAGNOSIS — I5022 Chronic systolic (congestive) heart failure: Secondary | ICD-10-CM | POA: Diagnosis not present

## 2021-11-13 LAB — RESP PANEL BY RT-PCR (FLU A&B, COVID) ARPGX2
Influenza A by PCR: NEGATIVE
Influenza B by PCR: NEGATIVE
SARS Coronavirus 2 by RT PCR: POSITIVE — AB

## 2021-11-13 MED ORDER — BENZONATATE 100 MG PO CAPS: 100.0000 mg | ORAL_CAPSULE | Freq: Three times a day (TID) | ORAL | 0 refills | Status: DC | PRN

## 2021-11-13 MED ORDER — MOLNUPIRAVIR 200 MG PO CAPS: 4.0000 | ORAL_CAPSULE | Freq: Two times a day (BID) | ORAL | 0 refills | Status: AC

## 2021-11-13 NOTE — ED Provider Notes (Signed)
[facility] Provider Note    Event Date/Time   First MD Initiated Contact with Patient 11/13/21 1017     (approximate)   History   Chief Complaint Nasal Congestion   HPI [name] is a 88 y.o. male, history of chronic systolic heart failure, prostate cancer, CKD stage IV, atrial fibrillation, hypertension, CKD, NSVT, anemia, COPD, presents to the emergency department for evaluation of cough, congestion.  Patient states that he has been experiencing runny nose, body aches, cough, and chest tightness x2 days.  He states that his wife has had similar symptoms, however she has not been seen by a medical provider or performed any test at home.    Denies fever/chills, chest pain, abdominal pain, flank pain, nausea/vomiting, diarrhea, dysuria, rash/lesions, dizziness/lightheadedness, numbness/tingling in upper or lower extremities, vision changes, hearing changes, leg swelling, or dysphagia.  History Limitations: No limitations.        Physical Exam  Triage Vital Signs: ED Triage Vitals  Enc Vitals Group     BP 11/13/21 1015 (!) 153/118     Pulse Rate 11/13/21 1015 100     Resp 11/13/21 1015 17     Temp 11/13/21 1015 98.2 F (36.8 C)     Temp src --      SpO2 11/13/21 1015 100 %     Weight --      Height --      Head Circumference --      Peak Flow --      Pain Score 11/13/21 1010 0     Pain Loc --      Pain Edu? --      Excl. in [place]? --     Most recent vital signs: Vitals:   11/13/21 1015 11/13/21 1200  BP: (!) 153/118 (!) 159/103  Pulse: 100 74  Resp: 17 20  Temp: 98.2 F (36.8 C) 97.7 F (36.5 C)  SpO2: 100% 95%    General: Awake, NAD.  Audible sinus congestion and cough. Skin: Warm, dry. No rashes or lesions.  Eyes: PERRL. Conjunctivae normal.  CV: Good peripheral perfusion.  Resp: Normal effort.  Mild rhonchi present bilaterally. Abd: Soft, non-tender. No distention.  Neuro: At baseline. No gross neurological deficits.   Musculoskeletal: Normal ROM of all extremities.  Focused Exam: Throat exam unremarkable.  No tonsillar exudates or swelling.  Uvula midline.  Physical Exam    ED Results / Procedures / Treatments  Labs (all labs ordered are listed, but only abnormal results are displayed) Labs Reviewed  RESP PANEL BY RT-PCR (FLU A&B, COVID) ARPGX2 - Abnormal; Notable for the following components:      Result Value   SARS Coronavirus 2 by RT PCR POSITIVE (*)    All other components within normal limits     EKG N/A.    RADIOLOGY  ED Provider Interpretation: I personally reviewed and interpreted this x-ray, no evidence of pneumonia.  DG Chest 2 View  Result Date: 11/13/2021 CLINICAL DATA:  Rule out pneumonia EXAM: CHEST - 2 VIEW COMPARISON:  10/03/2021 FINDINGS: Cardiomegaly. No acute airspace opacity. Unchanged elevation of the left hemidiaphragm and scarring or atelectasis of the right lung base. Disc degenerative disease of the thoracic spine. IMPRESSION: 1.  Cardiomegaly without acute airspace opacity. 2. Unchanged elevation of the left hemidiaphragm and scarring or atelectasis of the right lung base. Electronically Signed   By: Delanna Ahmadi M.D.   On: 11/13/2021 10:59    PROCEDURES:  Critical Care performed: N/A.  Procedures    MEDICATIONS ORDERED IN ED: Medications - No data to display   IMPRESSION / MDM / [place] / ED COURSE  I reviewed the triage vital signs and the nursing notes.                              Differential diagnosis includes, but is not limited to, COVID-19, influenza, bronchitis, community-acquired pneumonia, allergic rhinitis, sinusitis.  ED Course Patient appears well, vitals within normal limits.  NAD.  Respiratory panel positive for COVID-19.  Negative for influenza.  Assessment/Plan Presentation consistent with COVID-19, confirmed by PCR.  Chest x-ray does not show any evidence of pneumonia.  He appears clinically well at this time.   Given his comorbidities, he does qualify for antiviral therapy, though his GFR is less than 30 will likely not tolerate Paxlovid.  However, we will treat him with Molnupiravir.  Recommend they follow-up with his primary care provider within the next 3 to 5 days for reevaluation.  Additionally provided with a prescription for benzonatate to help manage his cough.  Will discharge.  Provided the patient with anticipatory guidance, return precautions, and educational material. Encouraged the patient to return to the emergency department at any time if they begin to experience any new or worsening symptoms. Patient expressed understanding and agreed with the plan.   Patient's presentation is most consistent with acute complicated illness / injury requiring diagnostic workup.       FINAL CLINICAL IMPRESSION(S) / ED DIAGNOSES   Final diagnoses:  COVID-19     Rx / DC Orders   ED Discharge Orders          Ordered    molnupiravir EUA (LAGEVRIO) 200 MG CAPS capsule  2 times daily        11/13/21 1155    benzonatate (TESSALON PERLES) 100 MG capsule  3 times daily PRN        11/13/21 1155             Note:  This document was prepared using Dragon voice recognition software and may include unintentional dictation errors.   Teodoro Spray, [place] 11/13/21 1607    Lavonia Drafts, MD 11/15/21 [phone]

## 2021-11-13 NOTE — Discharge Instructions (Addendum)
-  You tested positive for COVID-19 infection.  Given your heart and lung conditions, I would like you to take the molnupiravir daily for the next 5 days as prescribed.  Will help prevent any complications as your body naturally recovers.  -For your symptoms, you may take benzonatate as needed for the coughing.  You may additionally utilize your inhaler at home.  For the fever and body aches, please take Tylenol/ibuprofen as needed.  -Follow-up with your primary care provider or return to the emergency department if your symptoms significantly worsen.  -Avoid contact with others over the next 5 days to prevent further spread of the virus.  -Return to the emergency department anytime if you begin to experience any new or worsening symptoms.

## 2021-11-13 NOTE — ED Triage Notes (Signed)
Pt comes with c/o runny nose, cough and body aches. Pt states this started two days ago.

## 2021-11-13 NOTE — ED Notes (Signed)
Covid swab specimen sent to lab at 1026

## 2021-11-19 ENCOUNTER — Ambulatory Visit (INDEPENDENT_AMBULATORY_CARE_PROVIDER_SITE_OTHER): Payer: Medicare Other | Admitting: Nurse Practitioner

## 2021-11-19 ENCOUNTER — Encounter: Payer: Self-pay | Admitting: Nurse Practitioner

## 2021-11-19 ENCOUNTER — Other Ambulatory Visit (HOSPITAL_COMMUNITY): Payer: Self-pay

## 2021-11-19 VITALS — BP 130/67 | HR 80 | Temp 96.4°F | Resp 16 | Ht 72.0 in | Wt 235.0 lb

## 2021-11-19 DIAGNOSIS — J069 Acute upper respiratory infection, unspecified: Secondary | ICD-10-CM | POA: Diagnosis not present

## 2021-11-19 DIAGNOSIS — U071 COVID-19: Secondary | ICD-10-CM | POA: Diagnosis not present

## 2021-11-19 DIAGNOSIS — G4733 Obstructive sleep apnea (adult) (pediatric): Secondary | ICD-10-CM | POA: Diagnosis not present

## 2021-11-19 NOTE — Progress Notes (Signed)
[facility] [place], [place] [address]  Internal MEDICINE  Office Visit Note  Patient Name: [name]  [id number]  [id number]  Date of Service: 11/19/2021  Chief Complaint  Patient presents with   Acute Visit    ED f/u-covid     HPI Chaney presents for a follow up visit after going to the ED due to URI from covid infection. He was treated, no longer contagious, been 5 days now. Symptoms are improving. No issues.     Current Medication: Outpatient Encounter Medications as of 11/19/2021  Medication Sig   albuterol (VENTOLIN HFA) 108 (90 Base) MCG/ACT inhaler Inhale 2 puffs into the lungs every 4 (four) hours as needed for wheezing or shortness of breath.   benzonatate (TESSALON PERLES) 100 MG capsule Take 1 capsule (100 mg total) by mouth 3 (three) times daily as needed for cough.   edoxaban (SAVAYSA) 30 MG TABS tablet Take by mouth.   enzalutamide (XTANDI) 40 MG tablet Take 4 tablets (160 mg total) by mouth daily.   FARXIGA 10 MG TABS tablet TAKE 1 TABLET(10 MG) BY MOUTH DAILY BEFORE BREAKFAST   ferrous sulfate 325 (65 FE) MG tablet Take 325 mg by mouth daily.    glucose blood (ONETOUCH VERIO) test strip USE ONCE DAILY. DX E11.65.   metoprolol tartrate 37.5 MG TABS Take 37.5 mg by mouth 2 (two) times daily.   pantoprazole (PROTONIX) 40 MG tablet TAKE 1 TABLET(40 MG) BY MOUTH DAILY   Tiotropium Bromide Monohydrate (SPIRIVA RESPIMAT) 2.5 MCG/ACT AERS Inhale 2 puffs into the lungs daily.   vitamin B-12 (CYANOCOBALAMIN) 1000 MCG tablet Take 1 tablet (1,000 mcg total) by mouth daily.   [DISCONTINUED] atorvastatin (LIPITOR) 10 MG tablet TAKE 1 TABLET(10 MG) BY MOUTH DAILY   [DISCONTINUED] tamsulosin (FLOMAX) 0.4 MG CAPS capsule Take 1 capsule (0.4 mg total) by mouth daily.   No facility-administered encounter medications on file as of 11/19/2021.    Surgical History: Past Surgical History:  Procedure Laterality Date   CATARACT EXTRACTION      COLONOSCOPY WITH PROPOFOL N/A 07/02/2019   Procedure: COLONOSCOPY WITH PROPOFOL;  Surgeon: Lin Landsman, MD;  Location: [facility] ENDOSCOPY;  Service: Gastroenterology;  Laterality: N/A;   IR CT HEAD LTD  08/27/2019   IR PERCUTANEOUS ART THROMBECTOMY/INFUSION INTRACRANIAL INC DIAG ANGIO  08/27/2019       IR PERCUTANEOUS ART THROMBECTOMY/INFUSION INTRACRANIAL INC DIAG ANGIO  08/27/2019   LOWER EXTREMITY ANGIOGRAPHY Left 12/25/2020   Procedure: Lower Extremity Angiography;  Surgeon: Algernon Huxley, MD;  Location: [place] CV LAB;  Service: Cardiovascular;  Laterality: Left;   PACEMAKER LEADLESS INSERTION N/A 12/19/2020   Procedure: PACEMAKER LEADLESS INSERTION;  Surgeon: Isaias Cowman, MD;  Location: [place] CV LAB;  Service: Cardiovascular;  Laterality: N/A;   PROSTATE CANCER     PROSTATE SURGERY     RADIOLOGY WITH ANESTHESIA N/A 08/27/2019   Procedure: IR WITH ANESTHESIA;  Surgeon: Luanne Bras, MD;  Location: [place];  Service: Radiology;  Laterality: N/A;    Medical History: Past Medical History:  Diagnosis Date   Atrial fibrillation (HCC)    CHF (congestive heart failure) ([place])    Diabetes ([place])    Hearing loss    High blood pressure    History of bladder problems    Kidney stones 11/22/2014   Prostate cancer ([place])    Prostate cancer ([place])    Stroke [facility])     Family History: Family History  Problem Relation Age of  Onset   Colon cancer Mother    Diabetes Other    High blood pressure Other    Prostate cancer Brother    Diabetes Brother     Social History   Socioeconomic History   Marital status: Married    Spouse name: Anne [place]   Number of children: 2   Years of education: 12   Highest education level: 12th grade  Occupational History   Occupation: [employer]  Tobacco Use   Smoking status: Former    Years: 16.00    Types: Cigarettes   Smokeless tobacco: Never  Vaping Use   Vaping Use: Never used  Substance and Sexual Activity   Alcohol use:  No   Drug use: No   Sexual activity: Not Currently  Other Topics Concern   Not on file  Social History Narrative   Not on file   Social Determinants of Health   Financial Resource Strain: Low Risk  (10/29/2019)   Overall Financial Resource Strain (CARDIA)    Difficulty of Paying Living Expenses: Not very hard  Recent Concern: Financial Resource Strain - Medium Risk (09/08/2019)   Overall Financial Resource Strain (CARDIA)    Difficulty of Paying Living Expenses: Somewhat hard  Food Insecurity: No Food Insecurity (10/04/2021)   Hunger Vital Sign    Worried About Running Out of Food in the Last Year: Never true    Ran Out of Food in the Last Year: Never true  Transportation Needs: No Transportation Needs (10/04/2021)   PRAPARE - Hydrologist (Medical): No    Lack of Transportation (Non-Medical): No  Physical Activity: Inactive (10/29/2019)   Exercise Vital Sign    Days of Exercise per Week: 0 days    Minutes of Exercise per Session: 0 min  Stress: No Stress Concern Present (10/29/2019)   [place]    Feeling of Stress : Only a little  Recent Concern: Stress - Stress Concern Present (09/08/2019)   [place]    Feeling of Stress : To some extent  Social Connections: Moderately Integrated (10/29/2019)   Social Connection and Isolation Panel [NHANES]    Frequency of Communication with Friends and Family: More than three times a week    Frequency of Social Gatherings with Friends and Family: More than three times a week    Attends Religious Services: More than 4 times per year    Active Member of [employer] or Organizations: No    Attends Archivist Meetings: Never    Marital Status: Married  Human resources officer Violence: Not At Risk (10/04/2021)   Humiliation, Afraid, Rape, and Kick questionnaire    Fear of Current or  Ex-Partner: No    Emotionally Abused: No    Physically Abused: No    Sexually Abused: No      Review of Systems  Constitutional:  Positive for fatigue. Negative for chills and unexpected weight change.  HENT:  Positive for congestion, postnasal drip, rhinorrhea, sinus pressure and sore throat. Negative for sneezing.   Eyes:  Negative for redness.  Respiratory:  Positive for cough and shortness of breath. Negative for chest tightness and wheezing.   Cardiovascular: Negative.  Negative for chest pain and palpitations.  Gastrointestinal: Negative.  Negative for abdominal pain, constipation, diarrhea, nausea and vomiting.  Genitourinary:  Negative for dysuria and frequency.  Musculoskeletal:  Negative for arthralgias, back pain, joint swelling and neck pain.  Skin:  Negative for rash.  Neurological: Negative.  Negative for tremors and numbness.  Hematological:  Negative for adenopathy. Does not bruise/bleed easily.  Psychiatric/Behavioral:  Negative for behavioral problems (Depression), sleep disturbance and suicidal ideas. The patient is not nervous/anxious.     Vital Signs: BP 130/67   Pulse 80   Temp (!) 96.4 F (35.8 C)   Resp 16   Ht 6' (1.829 m)   Wt 235 lb (106.6 kg)   SpO2 97%   BMI 31.87 kg/m    Physical Exam Vitals reviewed.  Constitutional:      General: He is not in acute distress.    Appearance: Normal appearance. He is obese. He is not ill-appearing.  HENT:     Head: Normocephalic and atraumatic.  Eyes:     Pupils: Pupils are equal, round, and reactive to light.  Cardiovascular:     Rate and Rhythm: Normal rate and regular rhythm.  Pulmonary:     Effort: Pulmonary effort is normal. No respiratory distress.  Neurological:     Mental Status: He is alert and oriented to person, place, and time.  Psychiatric:        Mood and Affect: Mood normal.        Behavior: Behavior normal.        Assessment/Plan: 1. Upper respiratory tract infection due to  COVID-19 virus Symptoms are resolving, patient is improving. Completed treatment, call clinic if worsening symptoms. No further interventions at this time.    General Counseling: sora olivo understanding of the findings of todays visit and agrees with plan of treatment. I have discussed any further diagnostic evaluation that may be needed or ordered today. We also reviewed his medications today. he has been encouraged to call the office with any questions or concerns that should arise related to todays visit.    No orders of the defined types were placed in this encounter.   No orders of the defined types were placed in this encounter.   Return if symptoms worsen or fail to improve.   Total time spent:20 Minutes Time spent includes review of chart, medications, test results, and follow up plan with the patient.   [place] Controlled Substance Database was reviewed by me.  This patient was seen by Jonetta Osgood, FNP-C in collaboration with Dr. Clayborn Bigness as a part of collaborative care agreement.   Cinthya Bors R. Valetta Fuller, MSN, FNP-C Internal medicine

## 2021-11-21 ENCOUNTER — Telehealth: Payer: Self-pay

## 2021-11-21 NOTE — Telephone Encounter (Signed)
        Patient  visited [place] on 10/24    Telephone encounter attempt :  1st Unable to leave a message   [place] [place] [facility]  [phone] [address], [place], [place] [address] Phone: [phone] Email: [email or web address]

## 2021-11-22 ENCOUNTER — Other Ambulatory Visit (HOSPITAL_COMMUNITY): Payer: Self-pay

## 2021-11-26 ENCOUNTER — Other Ambulatory Visit (HOSPITAL_COMMUNITY): Payer: Self-pay

## 2021-11-27 ENCOUNTER — Ambulatory Visit: Payer: Medicare Other | Admitting: Nurse Practitioner

## 2021-11-27 ENCOUNTER — Other Ambulatory Visit (HOSPITAL_COMMUNITY): Payer: Self-pay

## 2021-11-29 ENCOUNTER — Other Ambulatory Visit (HOSPITAL_COMMUNITY): Payer: Self-pay

## 2021-11-30 ENCOUNTER — Encounter: Payer: Self-pay | Admitting: Oncology

## 2021-11-30 ENCOUNTER — Inpatient Hospital Stay: Payer: Medicare Other | Admitting: Pharmacist

## 2021-11-30 ENCOUNTER — Other Ambulatory Visit: Payer: Self-pay

## 2021-11-30 ENCOUNTER — Other Ambulatory Visit (HOSPITAL_COMMUNITY): Payer: Self-pay

## 2021-11-30 ENCOUNTER — Inpatient Hospital Stay (HOSPITAL_BASED_OUTPATIENT_CLINIC_OR_DEPARTMENT_OTHER): Payer: Medicare Other | Admitting: Oncology

## 2021-11-30 ENCOUNTER — Inpatient Hospital Stay: Payer: Medicare Other | Attending: Oncology

## 2021-11-30 VITALS — BP 135/89 | HR 76 | Temp 97.0°F | Resp 18 | Wt 241.7 lb

## 2021-11-30 DIAGNOSIS — N184 Chronic kidney disease, stage 4 (severe): Secondary | ICD-10-CM | POA: Insufficient documentation

## 2021-11-30 DIAGNOSIS — Z7901 Long term (current) use of anticoagulants: Secondary | ICD-10-CM | POA: Diagnosis not present

## 2021-11-30 DIAGNOSIS — C61 Malignant neoplasm of prostate: Secondary | ICD-10-CM

## 2021-11-30 DIAGNOSIS — C772 Secondary and unspecified malignant neoplasm of intra-abdominal lymph nodes: Secondary | ICD-10-CM | POA: Insufficient documentation

## 2021-11-30 DIAGNOSIS — Z7984 Long term (current) use of oral hypoglycemic drugs: Secondary | ICD-10-CM | POA: Insufficient documentation

## 2021-11-30 DIAGNOSIS — E1122 Type 2 diabetes mellitus with diabetic chronic kidney disease: Secondary | ICD-10-CM | POA: Diagnosis not present

## 2021-11-30 DIAGNOSIS — Z9079 Acquired absence of other genital organ(s): Secondary | ICD-10-CM | POA: Insufficient documentation

## 2021-11-30 DIAGNOSIS — I4891 Unspecified atrial fibrillation: Secondary | ICD-10-CM | POA: Diagnosis not present

## 2021-11-30 DIAGNOSIS — I4811 Longstanding persistent atrial fibrillation: Secondary | ICD-10-CM | POA: Diagnosis not present

## 2021-11-30 DIAGNOSIS — Z79899 Other long term (current) drug therapy: Secondary | ICD-10-CM | POA: Insufficient documentation

## 2021-11-30 LAB — COMPREHENSIVE METABOLIC PANEL
ALT: 21 U/L (ref 0–44)
AST: 21 U/L (ref 15–41)
Albumin: 3.6 g/dL (ref 3.5–5.0)
Alkaline Phosphatase: 122 U/L (ref 38–126)
Anion gap: 8 (ref 5–15)
BUN: 43 mg/dL — ABNORMAL HIGH (ref 8–23)
CO2: 24 mmol/L (ref 22–32)
Calcium: 9.3 mg/dL (ref 8.9–10.3)
Chloride: 106 mmol/L (ref 98–111)
Creatinine, Ser: 2.3 mg/dL — ABNORMAL HIGH (ref 0.61–1.24)
GFR, Estimated: 27 mL/min — ABNORMAL LOW (ref >60)
Glucose, Bld: 196 mg/dL — ABNORMAL HIGH (ref 70–99)
Potassium: 4.2 mmol/L (ref 3.5–5.1)
Sodium: 138 mmol/L (ref 135–145)
Total Bilirubin: 0.6 mg/dL (ref 0.3–1.2)
Total Protein: 7.5 g/dL (ref 6.5–8.1)

## 2021-11-30 LAB — CBC WITH DIFFERENTIAL/PLATELET
Abs Immature Granulocytes: 0.02 10*3/uL (ref 0.00–0.07)
Basophils Absolute: 0 10*3/uL (ref 0.0–0.1)
Basophils Relative: 1 %
Eosinophils Absolute: 0.2 10*3/uL (ref 0.0–0.5)
Eosinophils Relative: 3 %
HCT: 46.9 % (ref 39.0–52.0)
Hemoglobin: 14.7 g/dL (ref 13.0–17.0)
Immature Granulocytes: 0 %
Lymphocytes Relative: 28 %
Lymphs Abs: 1.5 10*3/uL (ref 0.7–4.0)
MCH: 28.9 pg (ref 26.0–34.0)
MCHC: 31.3 g/dL (ref 30.0–36.0)
MCV: 92.3 fL (ref 80.0–100.0)
Monocytes Absolute: 0.8 10*3/uL (ref 0.1–1.0)
Monocytes Relative: 15 %
Neutro Abs: 2.9 10*3/uL (ref 1.7–7.7)
Neutrophils Relative %: 53 %
Platelets: 196 10*3/uL (ref 150–400)
RBC: 5.08 MIL/uL (ref 4.22–5.81)
RDW: 15.2 % (ref 11.5–15.5)
WBC: 5.4 10*3/uL (ref 4.0–10.5)
nRBC: 0 % (ref 0.0–0.2)

## 2021-11-30 NOTE — Assessment & Plan Note (Signed)
Currently on Edoxaban, follow up with cardiology 

## 2021-11-30 NOTE — Assessment & Plan Note (Signed)
Encourage oral hydration and avoid nephrotoxins.   

## 2021-11-30 NOTE — Assessment & Plan Note (Signed)
rostate cancer recurrence.  Non- regional (retroperitoneal) lymphadenopathy -M1a Labs reviewed and discussed with patient.  PSA progressively trending up to 83. 01/05/2021 Bone scan showed No convincing scintigraphic evidence of bony metastatic disease. 01/05/2021 Mild increase in size of left retroperitoneal, right common iliac and right external iliac lymph nodes compatible with progression of disease.No CT findings of osseous metastasis. Patient is on androgen deprivation therapy with Eligard 45 mg every 6 months- Next Due end of Dec 2023   Clinically he tolerates Xtandi '160mg'$  daily well. Continue current regimen
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
## 2021-12-07 ENCOUNTER — Other Ambulatory Visit (HOSPITAL_COMMUNITY): Payer: Self-pay

## 2021-12-12 DIAGNOSIS — I482 Chronic atrial fibrillation, unspecified: Secondary | ICD-10-CM | POA: Diagnosis not present

## 2021-12-20 ENCOUNTER — Ambulatory Visit (INDEPENDENT_AMBULATORY_CARE_PROVIDER_SITE_OTHER): Payer: Medicare Other | Admitting: Nurse Practitioner

## 2021-12-20 ENCOUNTER — Encounter: Payer: Self-pay | Admitting: Nurse Practitioner

## 2021-12-20 VITALS — BP 128/76 | HR 80 | Temp 96.1°F | Resp 16 | Ht 72.0 in | Wt 246.8 lb

## 2021-12-20 DIAGNOSIS — I700 Atherosclerosis of aorta: Secondary | ICD-10-CM | POA: Diagnosis not present

## 2021-12-20 DIAGNOSIS — E1165 Type 2 diabetes mellitus with hyperglycemia: Secondary | ICD-10-CM | POA: Diagnosis not present

## 2021-12-20 DIAGNOSIS — G4733 Obstructive sleep apnea (adult) (pediatric): Secondary | ICD-10-CM | POA: Diagnosis not present

## 2021-12-20 DIAGNOSIS — R300 Dysuria: Secondary | ICD-10-CM

## 2021-12-20 DIAGNOSIS — Z0001 Encounter for general adult medical examination with abnormal findings: Secondary | ICD-10-CM | POA: Diagnosis not present

## 2021-12-20 LAB — POCT GLYCOSYLATED HEMOGLOBIN (HGB A1C): Hemoglobin A1C: 7.7 % — AB (ref 4.0–5.6)

## 2021-12-20 MED ORDER — ATORVASTATIN CALCIUM 10 MG PO TABS: ORAL_TABLET | ORAL | 1 refills | Status: DC

## 2021-12-20 NOTE — Progress Notes (Signed)
[facility] [place], [place] [address]  Internal MEDICINE  Office Visit Note  Patient Name: [name]  [id number]  [id number]  Date of Service: 12/20/2021  Chief Complaint  Patient presents with   Medicare Wellness   Diabetes   Hypertension    HPI [name] presents for an annual well visit and physical exam.  Well-appearing 88 y.o. male with atrial fibrillation, hypertension, aortic atherosclerosis, COPD, OSA, CKD, osteoarthritis, diabetes, and prostate cancer and currently on treatment with Xtandi --switching to edoxaban for anticoagulation due to interactions between eliquis and Xtandi.  -last PSA was 132 -- sees urology A1c 7.7 today, up from 6.8 in July. Has forgotten to take his medication some and has bee lax with his diet.  Eye exam:  done April this year  foot exam: due today Labs: recently done by specialist New or worsening pain: none Other concerns: none        12/20/2021   10:58 AM 12/08/2020   10:54 AM 10/15/2019    3:05 PM 07/23/2018    8:38 AM 07/01/2017    2:45 PM  MMSE - Mini Mental State Exam  Orientation to time '5 5 5 5 5  '$ Orientation to Place '5 5 5 5 5  '$ Registration '3 3 3 3 3  '$ Attention/ Calculation '5 5 5 5 5  '$ Recall '3 3 3 3 3  '$ Language- name 2 objects '2 2 2 2 2  '$ Language- repeat 0 '1 1 1 1  '$ Language- follow 3 step command '3 3 3 3 3  '$ Language- read & follow direction '1 1 1 1 1  '$ Write a sentence 0 0 0 1 0  Copy design 1 0 0 1 0  Total score '28 28 28 30 28    '$ Functional Status Survey: Is the patient deaf or have difficulty hearing?: No Does the patient have difficulty seeing, even when wearing glasses/contacts?: No Does the patient have difficulty concentrating, remembering, or making decisions?: No Does the patient have difficulty walking or climbing stairs?: No Does the patient have difficulty dressing or bathing?: No Does the patient have difficulty doing errands alone such as visiting a doctor's office or  shopping?: No   Current Medication: Outpatient Encounter Medications as of 12/20/2021  Medication Sig   albuterol (VENTOLIN HFA) 108 (90 Base) MCG/ACT inhaler Inhale 2 puffs into the lungs every 4 (four) hours as needed for wheezing or shortness of breath.   benzonatate (TESSALON PERLES) 100 MG capsule Take 1 capsule (100 mg total) by mouth 3 (three) times daily as needed for cough.   edoxaban (SAVAYSA) 30 MG TABS tablet Take by mouth.   enzalutamide (XTANDI) 40 MG tablet Take 4 tablets (160 mg total) by mouth daily.   FARXIGA 10 MG TABS tablet TAKE 1 TABLET(10 MG) BY MOUTH DAILY BEFORE BREAKFAST   ferrous sulfate 325 (65 FE) MG tablet Take 325 mg by mouth daily.    glucose blood (ONETOUCH VERIO) test strip USE ONCE DAILY. DX E11.65.   metoprolol tartrate 37.5 MG TABS Take 37.5 mg by mouth 2 (two) times daily.   pantoprazole (PROTONIX) 40 MG tablet TAKE 1 TABLET(40 MG) BY MOUTH DAILY   Tiotropium Bromide Monohydrate (SPIRIVA RESPIMAT) 2.5 MCG/ACT AERS Inhale 2 puffs into the lungs daily.   vitamin B-12 (CYANOCOBALAMIN) 1000 MCG tablet Take 1 tablet (1,000 mcg total) by mouth daily.   [DISCONTINUED] atorvastatin (LIPITOR) 10 MG tablet TAKE 1 TABLET(10 MG) BY MOUTH DAILY   [DISCONTINUED] tamsulosin (FLOMAX) 0.4 MG  CAPS capsule Take 1 capsule (0.4 mg total) by mouth daily.   atorvastatin (LIPITOR) 10 MG tablet TAKE 1 TABLET(10 MG) BY MOUTH DAILY   No facility-administered encounter medications on file as of 12/20/2021.    Surgical History: Past Surgical History:  Procedure Laterality Date   CATARACT EXTRACTION     COLONOSCOPY WITH PROPOFOL N/A 07/02/2019   Procedure: COLONOSCOPY WITH PROPOFOL;  Surgeon: Lin Landsman, MD;  Location: [facility] ENDOSCOPY;  Service: Gastroenterology;  Laterality: N/A;   IR CT HEAD LTD  08/27/2019   IR PERCUTANEOUS ART THROMBECTOMY/INFUSION INTRACRANIAL INC DIAG ANGIO  08/27/2019       IR PERCUTANEOUS ART THROMBECTOMY/INFUSION INTRACRANIAL INC DIAG ANGIO   08/27/2019   LOWER EXTREMITY ANGIOGRAPHY Left 12/25/2020   Procedure: Lower Extremity Angiography;  Surgeon: Algernon Huxley, MD;  Location: [place] CV LAB;  Service: Cardiovascular;  Laterality: Left;   PACEMAKER LEADLESS INSERTION N/A 12/19/2020   Procedure: PACEMAKER LEADLESS INSERTION;  Surgeon: Isaias Cowman, MD;  Location: [place] CV LAB;  Service: Cardiovascular;  Laterality: N/A;   PROSTATE CANCER     PROSTATE SURGERY     RADIOLOGY WITH ANESTHESIA N/A 08/27/2019   Procedure: IR WITH ANESTHESIA;  Surgeon: Luanne Bras, MD;  Location: [place];  Service: Radiology;  Laterality: N/A;    Medical History: Past Medical History:  Diagnosis Date   Atrial fibrillation (HCC)    CHF (congestive heart failure) ([place])    Diabetes ([place])    Hearing loss    High blood pressure    History of bladder problems    Kidney stones 11/22/2014   Prostate cancer ([place])    Prostate cancer ([place])    Stroke ([place])     Family History: Family History  Problem Relation Age of Onset   Colon cancer Mother    Diabetes Other    High blood pressure Other    Prostate cancer Brother    Diabetes Brother     Social History   Socioeconomic History   Marital status: Married    Spouse name: [name]   Number of children: 2   Years of education: 12   Highest education level: 12th grade  Occupational History   Occupation: [employer]  Tobacco Use   Smoking status: Former    Years: 16.00    Types: Cigarettes   Smokeless tobacco: Never  Vaping Use   Vaping Use: Never used  Substance and Sexual Activity   Alcohol use: No   Drug use: No   Sexual activity: Not Currently  Other Topics Concern   Not on file  Social History Narrative   Not on file   Social Determinants of Health   Financial Resource Strain: Low Risk  (10/29/2019)   Overall Financial Resource Strain (CARDIA)    Difficulty of Paying Living Expenses: Not very hard  Recent Concern: Financial Resource Strain - Medium Risk  (09/08/2019)   Overall Financial Resource Strain (CARDIA)    Difficulty of Paying Living Expenses: Somewhat hard  Food Insecurity: No Food Insecurity (10/04/2021)   Hunger Vital Sign    Worried About Running Out of Food in the Last Year: Never true    Ran Out of Food in the Last Year: Never true  Transportation Needs: No Transportation Needs (10/04/2021)   PRAPARE - Hydrologist (Medical): No    Lack of Transportation (Non-Medical): No  Physical Activity: Inactive (10/29/2019)   Exercise Vital Sign    Days of Exercise per Week: 0 days  Minutes of Exercise per Session: 0 min  Stress: No Stress Concern Present (10/29/2019)   [place]    Feeling of Stress : Only a little  Recent Concern: Stress - Stress Concern Present (09/08/2019)   [place]    Feeling of Stress : To some extent  Social Connections: Moderately Integrated (10/29/2019)   Social Connection and Isolation Panel [NHANES]    Frequency of Communication with Friends and Family: More than three times a week    Frequency of Social Gatherings with Friends and Family: More than three times a week    Attends Religious Services: More than 4 times per year    Active Member of [employer] or Organizations: No    Attends Archivist Meetings: Never    Marital Status: Married  Human resources officer Violence: Not At Risk (10/04/2021)   Humiliation, Afraid, Rape, and Kick questionnaire    Fear of Current or Ex-Partner: No    Emotionally Abused: No    Physically Abused: No    Sexually Abused: No      Review of Systems  Constitutional:  Positive for fatigue. Negative for activity change, chills and unexpected weight change.       Activity levels are improving since his most recent visit.   HENT:  Negative for congestion, postnasal drip, rhinorrhea, sneezing and sore throat.    Respiratory:  Negative for cough, chest tightness, shortness of breath and wheezing.   Cardiovascular:  Negative for chest pain and palpitations.  Gastrointestinal:  Positive for abdominal distention and constipation. Negative for abdominal pain, diarrhea, nausea and vomiting.  Endocrine: Negative for cold intolerance, heat intolerance, polydipsia and polyuria.       Improved blood sugars .  Genitourinary:  Negative for dysuria, hematuria and urgency.  Musculoskeletal:  Negative for arthralgias, back pain, joint swelling and neck pain.  Skin:  Negative for rash.  Allergic/Immunologic: Negative for environmental allergies.  Neurological:  Positive for weakness. Negative for dizziness, tremors, numbness and headaches.  Hematological:  Negative for adenopathy. Does not bruise/bleed easily.  Psychiatric/Behavioral:  Negative for behavioral problems (Depression), sleep disturbance and suicidal ideas. The patient is not nervous/anxious.     Vital Signs: BP 128/76   Pulse 80   Temp (!) 96.1 F (35.6 C)   Resp 16   Ht 6' (1.829 m)   Wt 246 lb 12.8 oz (111.9 kg)   SpO2 96%   BMI 33.47 kg/m    Physical Exam Vitals and nursing note reviewed.  Constitutional:      General: He is not in acute distress.    Appearance: Normal appearance. He is well-developed. He is not diaphoretic.  HENT:     Head: Normocephalic and atraumatic.     Nose: Nose normal.     Mouth/Throat:     Pharynx: No oropharyngeal exudate.  Eyes:     Conjunctiva/sclera: Conjunctivae normal.     Pupils: Pupils are equal, round, and reactive to light.  Neck:     Thyroid: No thyromegaly.     Vascular: No carotid bruit or JVD.     Trachea: No tracheal deviation.  Cardiovascular:     Rate and Rhythm: Normal rate. Rhythm irregular.     Pulses:          Dorsalis pedis pulses are 1+ on the right side and 1+ on the left side.       Posterior tibial pulses are 1+ on  the right side and 1+ on the left side.     Heart sounds:  Murmur heard.     No friction rub. No gallop.     Comments: Irregular heart rhythm with soft, blowing murmur. Pulmonary:     Effort: Pulmonary effort is normal. No respiratory distress.     Breath sounds: Normal breath sounds. No wheezing or rales.  Chest:     Chest wall: No tenderness.  Abdominal:     General: Bowel sounds are normal.     Palpations: Abdomen is soft.     Tenderness: There is no abdominal tenderness.  Musculoskeletal:        General: Normal range of motion.     Cervical back: Normal range of motion and neck supple.     Right foot: Normal range of motion. No deformity, bunion, Charcot foot, foot drop or prominent metatarsal heads.     Left foot: Normal range of motion. No deformity, bunion, Charcot foot, foot drop or prominent metatarsal heads.  Feet:     Right foot:     Protective Sensation: 6 sites tested.  6 sites sensed.     Skin integrity: Dry skin (so dry they are peeling on the bottom possible tinea pedis.) present. No ulcer, blister, skin breakdown, erythema, warmth, callus or fissure.     Toenail Condition: Right toenails are abnormally thick, long and ingrown. Fungal disease present.    Left foot:     Protective Sensation: 6 sites tested.  6 sites sensed.     Skin integrity: Dry skin (so dry they are peeling on the bottom, possible tinea pedis) present. No ulcer, blister, skin breakdown, erythema, warmth, callus or fissure.     Toenail Condition: Left toenails are abnormally thick, long and ingrown. Fungal disease present. Lymphadenopathy:     Cervical: No cervical adenopathy.  Skin:    General: Skin is warm and dry.     Capillary Refill: Capillary refill takes less than 2 seconds.  Neurological:     Mental Status: He is alert and oriented to person, place, and time. Mental status is at baseline.     Cranial Nerves: No cranial nerve deficit.     Sensory: No sensory deficit.  Psychiatric:        Mood and Affect: Mood normal.        Behavior: Behavior  normal.        Thought Content: Thought content normal.        Judgment: Judgment normal.        Assessment/Plan: 1. Encounter for general adult medical examination with abnormal findings Age-appropriate preventive screenings and vaccinations discussed, annual physical exam completed. Routine labs for health maintenance deferred. PHM updated.   2. Type 2 diabetes mellitus with hyperglycemia, without long-term current use of insulin (HCC) A1c elevated at 7.7, no change to medications. Discussed diet and lifestyle modifications. Discussed strategies to help him not forget to take his medications - POCT glycosylated hemoglobin (Hb A1C)  3. Atherosclerosis of aorta (HCC) Continue atorvastatin as prescribed, refills ordered - atorvastatin (LIPITOR) 10 MG tablet; TAKE 1 TABLET(10 MG) BY MOUTH DAILY  Dispense: 90 tablet; Refill: 1  4. Dysuria Routine urinalysis done - UA/M w/rflx Culture, Routine - Microscopic Examination      General Counseling: Sherrell verbalizes understanding of the findings of todays visit and agrees with plan of treatment. I have discussed any further diagnostic evaluation that may be needed or ordered today. We also reviewed his medications today. he has been encouraged  to call the office with any questions or concerns that should arise related to todays visit.    Orders Placed This Encounter  Procedures   UA/M w/rflx Culture, Routine   POCT glycosylated hemoglobin (Hb A1C)    Meds ordered this encounter  Medications   atorvastatin (LIPITOR) 10 MG tablet    Sig: TAKE 1 TABLET(10 MG) BY MOUTH DAILY    Dispense:  90 tablet    Refill:  1    Return in about 3 months (around 03/21/2022) for F/U, Recheck A1C, [place] PCP.   Total time spent:30 Minutes Time spent includes review of chart, medications, test results, and follow up plan with the patient.   [place] Controlled Substance Database was reviewed by me.  This patient was seen by Jonetta Osgood, FNP-C  in collaboration with Dr. Clayborn Bigness as a part of collaborative care agreement.  Jesaiah Fabiano R. Valetta Fuller, MSN, FNP-C Internal medicine

## 2021-12-21 LAB — MICROSCOPIC EXAMINATION
Casts: NONE SEEN /lpf
Epithelial Cells (non renal): NONE SEEN /hpf (ref 0–10)
RBC, Urine: NONE SEEN /hpf (ref 0–2)

## 2021-12-21 LAB — UA/M W/RFLX CULTURE, ROUTINE
Bilirubin, UA: NEGATIVE
Ketones, UA: NEGATIVE
Leukocytes,UA: NEGATIVE
Nitrite, UA: NEGATIVE
Protein,UA: NEGATIVE
RBC, UA: NEGATIVE
Specific Gravity, UA: 1.021 (ref 1.005–1.030)
Urobilinogen, Ur: 0.2 mg/dL (ref 0.2–1.0)
pH, UA: 5.5 (ref 5.0–7.5)

## 2021-12-22 ENCOUNTER — Encounter: Payer: Self-pay | Admitting: Nurse Practitioner

## 2021-12-26 ENCOUNTER — Other Ambulatory Visit (HOSPITAL_COMMUNITY): Payer: Self-pay

## 2021-12-26 DIAGNOSIS — R600 Localized edema: Secondary | ICD-10-CM | POA: Diagnosis not present

## 2021-12-26 DIAGNOSIS — R8290 Unspecified abnormal findings in urine: Secondary | ICD-10-CM | POA: Diagnosis not present

## 2021-12-26 DIAGNOSIS — E1122 Type 2 diabetes mellitus with diabetic chronic kidney disease: Secondary | ICD-10-CM | POA: Diagnosis not present

## 2021-12-26 DIAGNOSIS — N184 Chronic kidney disease, stage 4 (severe): Secondary | ICD-10-CM | POA: Diagnosis not present

## 2022-01-01 ENCOUNTER — Other Ambulatory Visit: Payer: Self-pay

## 2022-01-02 ENCOUNTER — Other Ambulatory Visit (HOSPITAL_COMMUNITY): Payer: Self-pay

## 2022-01-02 ENCOUNTER — Telehealth: Payer: Self-pay

## 2022-01-02 NOTE — Telephone Encounter (Signed)
Oral Oncology Patient Advocate Encounter   Received notification that prior authorization for [name] is due for renewal.   PA submitted on 12.13.23  Key [id number]  Status is pending     [name], [name]  [phone] (phone) [phone] (fax) 01/02/2022 8:31 AM

## 2022-01-02 NOTE — Telephone Encounter (Signed)
Oral Oncology Patient Advocate Encounter  Prior Authorization for [name] has been approved.    PA# [id number]  Effective dates: 01/02/22 through 01/21/23    [name], [place] Patient [place]  [phone] (phone) [phone] (fax) 01/02/2022 9:13 AM

## 2022-01-03 ENCOUNTER — Encounter: Payer: Self-pay | Admitting: Podiatry

## 2022-01-03 ENCOUNTER — Ambulatory Visit: Payer: Medicare Other | Admitting: Podiatry

## 2022-01-03 VITALS — BP 137/68 | HR 70

## 2022-01-03 DIAGNOSIS — E0843 Diabetes mellitus due to underlying condition with diabetic autonomic (poly)neuropathy: Secondary | ICD-10-CM

## 2022-01-03 DIAGNOSIS — M79675 Pain in left toe(s): Secondary | ICD-10-CM | POA: Diagnosis not present

## 2022-01-03 DIAGNOSIS — M79674 Pain in right toe(s): Secondary | ICD-10-CM | POA: Diagnosis not present

## 2022-01-03 DIAGNOSIS — B351 Tinea unguium: Secondary | ICD-10-CM | POA: Diagnosis not present

## 2022-01-03 NOTE — Progress Notes (Signed)
Complaint:  Visit Type: Patient returns to my office for continued preventative foot care services. Complaint: Patient states" my nails have grown long and thick and become painful to walk and wear shoes" Patient has been diagnosed with DM with no foot complications. The patient presents for preventative foot care services. No changes to ROS.    Podiatric Exam: Vascular: dorsalis pedis and posterior tibial pulses are weakly  palpable bilateral. Capillary return is immediate. Temperature gradient is WNL. Skin turgor WNL  Sensorium: Normal Semmes Weinstein monofilament test. Normal tactile sensation bilaterally. Nail Exam: Pt has thick disfigured discolored nails with subungual debris noted bilateral entire nail hallux through fifth toenails Ulcer Exam: There is no evidence of ulcer or pre-ulcerative changes or infection. Orthopedic Exam: Muscle tone and strength are WNL. No limitations in general ROM. No crepitus or effusions noted. Foot type and digits show no abnormalities. Bony prominences are unremarkable. Skin:  Porokeratosis sub 3 right asymptomatic.Marland Kitchen  No infection noted. No infection or ulcers  Diagnosis:  Onychomycosis, , Pain in right toe, pain in left toes  Porokeratosis  Treatment & Plan Procedures and Treatment: Consent by patient was obtained for treatment procedures. The patient understood the discussion of treatment and procedures well. All questions were answered thoroughly reviewed. Debridement of mycotic and hypertrophic toenails, 1 through 5 bilateral and clearing of subungual debris. No ulceration, no infection noted.     Return Visit-Office Procedure: Patient instructed to return to the office for a follow up visit 10 weeks  for continued evaluation and treatment.    Gardiner Barefoot DPM

## 2022-01-04 ENCOUNTER — Inpatient Hospital Stay: Payer: Medicare Other | Attending: Oncology

## 2022-01-04 DIAGNOSIS — C772 Secondary and unspecified malignant neoplasm of intra-abdominal lymph nodes: Secondary | ICD-10-CM | POA: Insufficient documentation

## 2022-01-04 DIAGNOSIS — Z7901 Long term (current) use of anticoagulants: Secondary | ICD-10-CM | POA: Insufficient documentation

## 2022-01-04 DIAGNOSIS — Z7984 Long term (current) use of oral hypoglycemic drugs: Secondary | ICD-10-CM | POA: Diagnosis not present

## 2022-01-04 DIAGNOSIS — I4891 Unspecified atrial fibrillation: Secondary | ICD-10-CM | POA: Diagnosis not present

## 2022-01-04 DIAGNOSIS — Z79818 Long term (current) use of other agents affecting estrogen receptors and estrogen levels: Secondary | ICD-10-CM | POA: Diagnosis present

## 2022-01-04 DIAGNOSIS — C61 Malignant neoplasm of prostate: Secondary | ICD-10-CM | POA: Diagnosis not present

## 2022-01-04 DIAGNOSIS — Z79899 Other long term (current) drug therapy: Secondary | ICD-10-CM | POA: Insufficient documentation

## 2022-01-04 DIAGNOSIS — Z87891 Personal history of nicotine dependence: Secondary | ICD-10-CM | POA: Diagnosis not present

## 2022-01-04 DIAGNOSIS — Z9079 Acquired absence of other genital organ(s): Secondary | ICD-10-CM | POA: Diagnosis not present

## 2022-01-04 DIAGNOSIS — N184 Chronic kidney disease, stage 4 (severe): Secondary | ICD-10-CM | POA: Insufficient documentation

## 2022-01-04 DIAGNOSIS — E1122 Type 2 diabetes mellitus with diabetic chronic kidney disease: Secondary | ICD-10-CM | POA: Insufficient documentation

## 2022-01-04 LAB — COMPREHENSIVE METABOLIC PANEL
ALT: 13 U/L (ref 0–44)
AST: 20 U/L (ref 15–41)
Albumin: 3.5 g/dL (ref 3.5–5.0)
Alkaline Phosphatase: 120 U/L (ref 38–126)
Anion gap: 12 (ref 5–15)
BUN: 31 mg/dL — ABNORMAL HIGH (ref 8–23)
CO2: 24 mmol/L (ref 22–32)
Calcium: 9.2 mg/dL (ref 8.9–10.3)
Chloride: 106 mmol/L (ref 98–111)
Creatinine, Ser: 1.96 mg/dL — ABNORMAL HIGH (ref 0.61–1.24)
GFR, Estimated: 33 mL/min — ABNORMAL LOW (ref >60)
Glucose, Bld: 202 mg/dL — ABNORMAL HIGH (ref 70–99)
Potassium: 4.4 mmol/L (ref 3.5–5.1)
Sodium: 142 mmol/L (ref 135–145)
Total Bilirubin: 0.9 mg/dL (ref 0.3–1.2)
Total Protein: 7.5 g/dL (ref 6.5–8.1)

## 2022-01-04 LAB — CBC WITH DIFFERENTIAL/PLATELET
Abs Immature Granulocytes: 0.02 10*3/uL (ref 0.00–0.07)
Basophils Absolute: 0 10*3/uL (ref 0.0–0.1)
Basophils Relative: 1 %
Eosinophils Absolute: 0.2 10*3/uL (ref 0.0–0.5)
Eosinophils Relative: 3 %
HCT: 45.9 % (ref 39.0–52.0)
Hemoglobin: 14.8 g/dL (ref 13.0–17.0)
Immature Granulocytes: 0 %
Lymphocytes Relative: 31 %
Lymphs Abs: 1.7 10*3/uL (ref 0.7–4.0)
MCH: 29.8 pg (ref 26.0–34.0)
MCHC: 32.2 g/dL (ref 30.0–36.0)
MCV: 92.5 fL (ref 80.0–100.0)
Monocytes Absolute: 0.7 10*3/uL (ref 0.1–1.0)
Monocytes Relative: 12 %
Neutro Abs: 2.9 10*3/uL (ref 1.7–7.7)
Neutrophils Relative %: 53 %
Platelets: 171 10*3/uL (ref 150–400)
RBC: 4.96 MIL/uL (ref 4.22–5.81)
RDW: 15.8 % — ABNORMAL HIGH (ref 11.5–15.5)
WBC: 5.5 10*3/uL (ref 4.0–10.5)
nRBC: 0 % (ref 0.0–0.2)

## 2022-01-04 LAB — PSA: Prostatic Specific Antigen: 8.47 ng/mL — ABNORMAL HIGH (ref 0.00–4.00)

## 2022-01-08 ENCOUNTER — Encounter: Payer: Self-pay | Admitting: Oncology

## 2022-01-08 ENCOUNTER — Other Ambulatory Visit (HOSPITAL_COMMUNITY): Payer: Self-pay

## 2022-01-08 ENCOUNTER — Inpatient Hospital Stay: Payer: Medicare Other | Admitting: Oncology

## 2022-01-08 VITALS — BP 132/98 | HR 80 | Temp 96.5°F | Resp 18 | Wt 243.4 lb

## 2022-01-08 DIAGNOSIS — IMO0001 Reserved for inherently not codable concepts without codable children: Secondary | ICD-10-CM

## 2022-01-08 DIAGNOSIS — Z87891 Personal history of nicotine dependence: Secondary | ICD-10-CM | POA: Diagnosis not present

## 2022-01-08 DIAGNOSIS — Z79899 Other long term (current) drug therapy: Secondary | ICD-10-CM | POA: Diagnosis not present

## 2022-01-08 DIAGNOSIS — N184 Chronic kidney disease, stage 4 (severe): Secondary | ICD-10-CM | POA: Diagnosis not present

## 2022-01-08 DIAGNOSIS — C772 Secondary and unspecified malignant neoplasm of intra-abdominal lymph nodes: Secondary | ICD-10-CM | POA: Diagnosis not present

## 2022-01-08 DIAGNOSIS — C61 Malignant neoplasm of prostate: Secondary | ICD-10-CM | POA: Diagnosis not present

## 2022-01-08 DIAGNOSIS — Z7984 Long term (current) use of oral hypoglycemic drugs: Secondary | ICD-10-CM | POA: Diagnosis not present

## 2022-01-08 DIAGNOSIS — E1122 Type 2 diabetes mellitus with diabetic chronic kidney disease: Secondary | ICD-10-CM | POA: Diagnosis not present

## 2022-01-08 DIAGNOSIS — I4811 Longstanding persistent atrial fibrillation: Secondary | ICD-10-CM

## 2022-01-08 DIAGNOSIS — I4891 Unspecified atrial fibrillation: Secondary | ICD-10-CM | POA: Diagnosis not present

## 2022-01-08 DIAGNOSIS — Z79818 Long term (current) use of other agents affecting estrogen receptors and estrogen levels: Secondary | ICD-10-CM | POA: Diagnosis not present

## 2022-01-08 DIAGNOSIS — Z7901 Long term (current) use of anticoagulants: Secondary | ICD-10-CM | POA: Diagnosis not present

## 2022-01-08 MED ORDER — ENZALUTAMIDE 40 MG PO TABS
160.0000 mg | ORAL_TABLET | Freq: Every day | ORAL | 2 refills | Status: DC
  Filled 2022-01-08 – 2022-01-23 (×2): qty 120, 30d supply, fill #0
  Filled 2022-02-18: qty 120, 30d supply, fill #1

## 2022-01-08 NOTE — Progress Notes (Signed)
Patient here for follow up. No new concerns voiced.  °

## 2022-01-09 ENCOUNTER — Encounter: Payer: Self-pay | Admitting: Oncology

## 2022-01-09 NOTE — Assessment & Plan Note (Signed)
Encourage oral hydration and avoid nephrotoxins.   

## 2022-01-09 NOTE — Assessment & Plan Note (Signed)
Eligard 45 mg every 6 months.- Eligard next week.

## 2022-01-09 NOTE — Assessment & Plan Note (Addendum)
rostate cancer recurrence.  Non- regional (retroperitoneal) lymphadenopathy -M1a Labs reviewed and discussed with patient.  PSA progressively trending up to 83. 01/05/2021 Bone scan showed No convincing scintigraphic evidence of bony metastatic disease. 01/05/2021 Mild increase in size of left retroperitoneal, right common iliac and right external iliac lymph nodes compatible with progression of disease.No CT findings of osseous metastasis. Patient is on androgen deprivation therapy with Eligard 45 mg every 6 months- Next Due end of Dec 2023  Clinically he tolerates Xtandi '160mg'$  daily well. Continue current regimen PSA has improved to 8
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
## 2022-01-09 NOTE — Assessment & Plan Note (Signed)
Currently on Edoxaban, follow up with cardiology 

## 2022-01-10 ENCOUNTER — Other Ambulatory Visit: Payer: Self-pay | Admitting: Nurse Practitioner

## 2022-01-10 DIAGNOSIS — J449 Chronic obstructive pulmonary disease, unspecified: Secondary | ICD-10-CM

## 2022-01-11 ENCOUNTER — Telehealth: Payer: Self-pay | Admitting: Nurse Practitioner

## 2022-01-11 NOTE — Telephone Encounter (Signed)
Medical form completed notified wife to come pick up -nm

## 2022-01-15 ENCOUNTER — Inpatient Hospital Stay: Payer: Medicare Other

## 2022-01-15 DIAGNOSIS — N184 Chronic kidney disease, stage 4 (severe): Secondary | ICD-10-CM | POA: Diagnosis not present

## 2022-01-15 DIAGNOSIS — C61 Malignant neoplasm of prostate: Secondary | ICD-10-CM

## 2022-01-15 DIAGNOSIS — Z7984 Long term (current) use of oral hypoglycemic drugs: Secondary | ICD-10-CM | POA: Diagnosis not present

## 2022-01-15 DIAGNOSIS — E1122 Type 2 diabetes mellitus with diabetic chronic kidney disease: Secondary | ICD-10-CM | POA: Diagnosis not present

## 2022-01-15 DIAGNOSIS — Z87891 Personal history of nicotine dependence: Secondary | ICD-10-CM | POA: Diagnosis not present

## 2022-01-15 DIAGNOSIS — C772 Secondary and unspecified malignant neoplasm of intra-abdominal lymph nodes: Secondary | ICD-10-CM | POA: Diagnosis not present

## 2022-01-15 DIAGNOSIS — Z79899 Other long term (current) drug therapy: Secondary | ICD-10-CM | POA: Diagnosis not present

## 2022-01-15 DIAGNOSIS — I4891 Unspecified atrial fibrillation: Secondary | ICD-10-CM | POA: Diagnosis not present

## 2022-01-15 DIAGNOSIS — Z7901 Long term (current) use of anticoagulants: Secondary | ICD-10-CM | POA: Diagnosis not present

## 2022-01-15 MED ORDER — LEUPROLIDE ACETATE (6 MONTH) 45 MG ~~LOC~~ KIT
45.0000 mg | PACK | Freq: Once | SUBCUTANEOUS | Status: AC
  Administered 2022-01-15: 45 mg via SUBCUTANEOUS
  Filled 2022-01-15: qty 45

## 2022-01-19 DIAGNOSIS — G4733 Obstructive sleep apnea (adult) (pediatric): Secondary | ICD-10-CM | POA: Diagnosis not present

## 2022-01-23 ENCOUNTER — Other Ambulatory Visit: Payer: Self-pay

## 2022-01-23 ENCOUNTER — Other Ambulatory Visit (HOSPITAL_COMMUNITY): Payer: Self-pay

## 2022-01-24 DIAGNOSIS — R600 Localized edema: Secondary | ICD-10-CM | POA: Diagnosis not present

## 2022-01-24 DIAGNOSIS — I7143 Infrarenal abdominal aortic aneurysm, without rupture: Secondary | ICD-10-CM | POA: Diagnosis not present

## 2022-01-24 DIAGNOSIS — G4733 Obstructive sleep apnea (adult) (pediatric): Secondary | ICD-10-CM | POA: Diagnosis not present

## 2022-01-24 DIAGNOSIS — E782 Mixed hyperlipidemia: Secondary | ICD-10-CM | POA: Diagnosis not present

## 2022-01-24 DIAGNOSIS — I5022 Chronic systolic (congestive) heart failure: Secondary | ICD-10-CM | POA: Diagnosis not present

## 2022-01-24 DIAGNOSIS — I5032 Chronic diastolic (congestive) heart failure: Secondary | ICD-10-CM | POA: Diagnosis not present

## 2022-01-24 DIAGNOSIS — I10 Essential (primary) hypertension: Secondary | ICD-10-CM | POA: Diagnosis not present

## 2022-01-24 DIAGNOSIS — I482 Chronic atrial fibrillation, unspecified: Secondary | ICD-10-CM | POA: Diagnosis not present

## 2022-01-24 DIAGNOSIS — N1832 Chronic kidney disease, stage 3b: Secondary | ICD-10-CM | POA: Diagnosis not present

## 2022-01-25 ENCOUNTER — Other Ambulatory Visit (HOSPITAL_COMMUNITY): Payer: Self-pay

## 2022-02-02 ENCOUNTER — Other Ambulatory Visit: Payer: Self-pay | Admitting: Nurse Practitioner

## 2022-02-02 DIAGNOSIS — E1122 Type 2 diabetes mellitus with diabetic chronic kidney disease: Secondary | ICD-10-CM

## 2022-02-07 ENCOUNTER — Other Ambulatory Visit: Payer: Self-pay | Admitting: Physician Assistant

## 2022-02-07 DIAGNOSIS — N1832 Chronic kidney disease, stage 3b: Secondary | ICD-10-CM

## 2022-02-12 DIAGNOSIS — M1712 Unilateral primary osteoarthritis, left knee: Secondary | ICD-10-CM | POA: Diagnosis not present

## 2022-02-12 DIAGNOSIS — E0843 Diabetes mellitus due to underlying condition with diabetic autonomic (poly)neuropathy: Secondary | ICD-10-CM | POA: Diagnosis not present

## 2022-02-18 ENCOUNTER — Other Ambulatory Visit (HOSPITAL_COMMUNITY): Payer: Self-pay

## 2022-02-19 DIAGNOSIS — G4733 Obstructive sleep apnea (adult) (pediatric): Secondary | ICD-10-CM | POA: Diagnosis not present

## 2022-02-21 ENCOUNTER — Other Ambulatory Visit: Payer: Self-pay

## 2022-02-25 ENCOUNTER — Other Ambulatory Visit: Payer: Self-pay

## 2022-02-28 ENCOUNTER — Other Ambulatory Visit: Payer: Self-pay

## 2022-02-28 ENCOUNTER — Emergency Department
Admission: EM | Admit: 2022-02-28 | Discharge: 2022-02-28 | Disposition: A | Discharge status: Home / Self Care | Payer: Medicare Other | Attending: Emergency Medicine | Admitting: Emergency Medicine

## 2022-02-28 ENCOUNTER — Emergency Department: Payer: Medicare Other

## 2022-02-28 DIAGNOSIS — E119 Type 2 diabetes mellitus without complications: Secondary | ICD-10-CM | POA: Diagnosis not present

## 2022-02-28 DIAGNOSIS — N1832 Chronic kidney disease, stage 3b: Secondary | ICD-10-CM

## 2022-02-28 DIAGNOSIS — I129 Hypertensive chronic kidney disease with stage 1 through stage 4 chronic kidney disease, or unspecified chronic kidney disease: Secondary | ICD-10-CM | POA: Diagnosis not present

## 2022-02-28 DIAGNOSIS — Z7901 Long term (current) use of anticoagulants: Secondary | ICD-10-CM | POA: Diagnosis not present

## 2022-02-28 DIAGNOSIS — I509 Heart failure, unspecified: Secondary | ICD-10-CM | POA: Diagnosis not present

## 2022-02-28 DIAGNOSIS — M79662 Pain in left lower leg: Secondary | ICD-10-CM | POA: Diagnosis not present

## 2022-02-28 DIAGNOSIS — R600 Localized edema: Secondary | ICD-10-CM | POA: Diagnosis not present

## 2022-02-28 DIAGNOSIS — R609 Edema, unspecified: Secondary | ICD-10-CM | POA: Diagnosis not present

## 2022-02-28 DIAGNOSIS — N189 Chronic kidney disease, unspecified: Secondary | ICD-10-CM | POA: Diagnosis not present

## 2022-02-28 DIAGNOSIS — M7989 Other specified soft tissue disorders: Secondary | ICD-10-CM | POA: Diagnosis not present

## 2022-02-28 DIAGNOSIS — R6889 Other general symptoms and signs: Secondary | ICD-10-CM | POA: Diagnosis not present

## 2022-02-28 DIAGNOSIS — M79605 Pain in left leg: Secondary | ICD-10-CM | POA: Diagnosis not present

## 2022-02-28 DIAGNOSIS — Z743 Need for continuous supervision: Secondary | ICD-10-CM | POA: Diagnosis not present

## 2022-02-28 LAB — CBC WITH DIFFERENTIAL/PLATELET
Abs Immature Granulocytes: 0.02 10*3/uL (ref 0.00–0.07)
Basophils Absolute: 0 10*3/uL (ref 0.0–0.1)
Basophils Relative: 0 %
Eosinophils Absolute: 0.2 10*3/uL (ref 0.0–0.5)
Eosinophils Relative: 5 %
HCT: 46.3 % (ref 39.0–52.0)
Hemoglobin: 14.7 g/dL (ref 13.0–17.0)
Immature Granulocytes: 0 %
Lymphocytes Relative: 26 %
Lymphs Abs: 1.2 10*3/uL (ref 0.7–4.0)
MCH: 30.2 pg (ref 26.0–34.0)
MCHC: 31.7 g/dL (ref 30.0–36.0)
MCV: 95.1 fL (ref 80.0–100.0)
Monocytes Absolute: 0.6 10*3/uL (ref 0.1–1.0)
Monocytes Relative: 13 %
Neutro Abs: 2.5 10*3/uL (ref 1.7–7.7)
Neutrophils Relative %: 56 %
Platelets: 161 10*3/uL (ref 150–400)
RBC: 4.87 MIL/uL (ref 4.22–5.81)
RDW: 15.3 % (ref 11.5–15.5)
WBC: 4.5 10*3/uL (ref 4.0–10.5)
nRBC: 0 % (ref 0.0–0.2)

## 2022-02-28 LAB — BASIC METABOLIC PANEL
Anion gap: 7 (ref 5–15)
BUN: 23 mg/dL (ref 8–23)
CO2: 26 mmol/L (ref 22–32)
Calcium: 8.7 mg/dL — ABNORMAL LOW (ref 8.9–10.3)
Chloride: 105 mmol/L (ref 98–111)
Creatinine, Ser: 1.79 mg/dL — ABNORMAL HIGH (ref 0.61–1.24)
GFR, Estimated: 36 mL/min — ABNORMAL LOW (ref >60)
Glucose, Bld: 165 mg/dL — ABNORMAL HIGH (ref 70–99)
Potassium: 3.8 mmol/L (ref 3.5–5.1)
Sodium: 138 mmol/L (ref 135–145)

## 2022-02-28 NOTE — ED Notes (Signed)
US at bedside

## 2022-02-28 NOTE — Discharge Instructions (Signed)
Your lab tests and ultrasound of the leg are all okay today. Continue taking your medications and follow up with your doctor for continued evaluation of these symptoms.

## 2022-02-28 NOTE — ED Triage Notes (Signed)
Arrived by EMS from home for swelling and pain in left leg. Pt is on a blood thinner but doesn't know the name of med.  Ems vitals were all within normal range. GBS was 167 for ems. Pt wears Cpap at night. Pt has history of blood clots in leg. Leg in not hot to touch. MD at bedside assessing pt.

## 2022-02-28 NOTE — ED Notes (Signed)
EDMD at bedside

## 2022-02-28 NOTE — ED Provider Notes (Signed)
[facility] Provider Note    Event Date/Time   First MD Initiated Contact with Patient 02/28/22 [phone]     (approximate)   History   Chief Complaint: Leg Swelling (Arrived by EMS from home for swelling and pain in left leg. Pt is on a blood thinner but doesn't know the name of med.  Ems vitals were all within normal range. GBS was 167 for ems. Pt wears Cpap at night. Pt has history of blood clots in leg. Leg in not hot to touch. MD at bedside assessing pt. )   HPI  [name] is a 89 y.o. male with a history of atrial fibrillation, diabetes, CHF DVT who comes to the ED complaining of left leg pain and swelling that started last night.    He is on edoxaban for atrial fibrillation stroke prevention.  He reports compliance with all of his medications.  No chest pain shortness of breath or fever.  No recent strenuous activity or injuries.     Physical Exam   Triage Vital Signs: ED Triage Vitals  Enc Vitals Group     BP      Pulse      Resp      Temp      Temp src      SpO2      Weight      Height      Head Circumference      Peak Flow      Pain Score      Pain Loc      Pain Edu?      Excl. in [place]?     Most recent vital signs: Vitals:   02/28/22 0722 02/28/22 0725  BP: (!) 154/90   Pulse: 70 73  Resp: 15   Temp:  (!) 97.4 F (36.3 C)  SpO2: 94%     General: Awake, no distress.  CV:  Good peripheral perfusion.  Regular rate, irregular rhythm.  No murmurs.  Normal DP pulse bilaterally.  1+ pitting edema bilateral lower extremities.   Resp:  Normal effort.  Clear to auscultation bilaterally Abd:  No distention.  Soft nontender Other:  Mild tenseness and tenderness of the left calf.  Left calf circumference is 1 to 2 cm greater than the right calf.   ED Results / Procedures / Treatments   Labs (all labs ordered are listed, but only abnormal results are displayed) Labs Reviewed  BASIC METABOLIC PANEL - Abnormal; Notable for the  following components:      Result Value   Glucose, Bld 165 (*)    Creatinine, Ser 1.79 (*)    Calcium 8.7 (*)    GFR, Estimated 36 (*)    All other components within normal limits  CBC WITH DIFFERENTIAL/PLATELET     EKG Interpreted by me Atrial fibrillation, rate of 70.  Right axis, left bundle branch block.  No acute ischemic changes.   RADIOLOGY Ultrasound left lower extremity interpreted by me, negative for DVT.  Radiology report reviewed.   PROCEDURES:  Procedures   MEDICATIONS ORDERED IN ED: Medications - No data to display   IMPRESSION / MDM / [place] / ED COURSE  I reviewed the triage vital signs and the nursing notes.  DDx: DVT, calf strain, knee osteoarthritis, Baker's cyst  Patient's presentation is most consistent with acute presentation with potential threat to life or bodily function.  Patient presents with left calf pain and swelling.  No evidence of  arterial compromise.  No signs of infection.  No open wounds or acute trauma.  Will obtain labs and ultrasound of the leg.  He is being managed for left knee arthritis as well which may be contributory.   ----------------------------------------- 10:10 AM on 02/28/2022 ----------------------------------------- Ultrasound negative for DVT.  Remarkable, baseline CKD.  Stable for discharge.  Presentation consistent with musculoskeletal pain.     FINAL CLINICAL IMPRESSION(S) / ED DIAGNOSES   Final diagnoses:  Pain of left calf  Stage 3b chronic kidney disease ([place])     Rx / DC Orders   ED Discharge Orders     None        Note:  This document was prepared using Dragon voice recognition software and may include unintentional dictation errors.   Carrie Mew, MD 02/28/22 1010

## 2022-03-04 ENCOUNTER — Telehealth (INDEPENDENT_AMBULATORY_CARE_PROVIDER_SITE_OTHER): Payer: Self-pay | Admitting: Vascular Surgery

## 2022-03-04 NOTE — Telephone Encounter (Signed)
Patient made aware with medical advice and verbalized understanding

## 2022-03-04 NOTE — Telephone Encounter (Signed)
Patient called in stating that his left leg is still bothering him and thinks he should be seen sooner than April appt. States he went to ED and they did nothing for him because he is still in pain all they did was give him crutches and he still on crutches     Please call and advise

## 2022-03-04 NOTE — Telephone Encounter (Signed)
He has an appt next week with [place].  I would recommend he see his PCP in the interim

## 2022-03-05 ENCOUNTER — Ambulatory Visit (INDEPENDENT_AMBULATORY_CARE_PROVIDER_SITE_OTHER): Payer: Medicare Other | Admitting: Internal Medicine

## 2022-03-05 ENCOUNTER — Encounter: Payer: Self-pay | Admitting: Internal Medicine

## 2022-03-05 VITALS — BP 138/78 | HR 88 | Temp 98.0°F | Resp 16 | Ht 72.0 in | Wt 240.6 lb

## 2022-03-05 DIAGNOSIS — E1165 Type 2 diabetes mellitus with hyperglycemia: Secondary | ICD-10-CM

## 2022-03-05 DIAGNOSIS — Z7409 Other reduced mobility: Secondary | ICD-10-CM

## 2022-03-05 DIAGNOSIS — Z09 Encounter for follow-up examination after completed treatment for conditions other than malignant neoplasm: Secondary | ICD-10-CM | POA: Diagnosis not present

## 2022-03-05 DIAGNOSIS — M25572 Pain in left ankle and joints of left foot: Secondary | ICD-10-CM | POA: Diagnosis not present

## 2022-03-05 MED ORDER — COLCHICINE 0.6 MG PO TABS: ORAL_TABLET | ORAL | 2 refills | Status: DC

## 2022-03-05 NOTE — Progress Notes (Signed)
[facility] [place], [place] [address]  Internal MEDICINE  Office Visit Note  Patient Name: [name]  [id number]  [id number]  Date of Service: 03/13/2022     Chief Complaint  Patient presents with   Leg Swelling    Left leg swelling, patient went to ED for possible blood clot, no abnormal findings     HPI Pt is here for recent ED follow up. Pt went to ED left calf pain, dopplers were negative, was sent home with no medications  Wife is concerned about pain, localized to left ankle. No previous h/o gout Diabetes is under good control  Initial blood pressure is elevated but repeat is normal   Current Medication: Outpatient Encounter Medications as of 03/05/2022  Medication Sig   albuterol (VENTOLIN HFA) 108 (90 Base) MCG/ACT inhaler Inhale 2 puffs into the lungs every 4 (four) hours as needed for wheezing or shortness of breath.   atorvastatin (LIPITOR) 10 MG tablet TAKE 1 TABLET(10 MG) BY MOUTH DAILY   benzonatate (TESSALON PERLES) 100 MG capsule Take 1 capsule (100 mg total) by mouth 3 (three) times daily as needed for cough.   colchicine 0.6 MG tablet Take one tab po bid for ankle pain   edoxaban (SAVAYSA) 30 MG TABS tablet Take by mouth.   enzalutamide (XTANDI) 40 MG tablet Take 4 tablets (160 mg total) by mouth daily.   FARXIGA 10 MG TABS tablet TAKE 1 TABLET(10 MG) BY MOUTH DAILY BEFORE BREAKFAST   glucose blood (ONETOUCH VERIO) test strip USE ONCE DAILY   metoprolol tartrate 37.5 MG TABS Take 37.5 mg by mouth 2 (two) times daily.   pantoprazole (PROTONIX) 40 MG tablet TAKE 1 TABLET(40 MG) BY MOUTH DAILY   SPIRIVA RESPIMAT 2.5 MCG/ACT AERS INHALE 2 PUFFS INTO THE LUNGS DAILY   vitamin B-12 (CYANOCOBALAMIN) 1000 MCG tablet Take 1 tablet (1,000 mcg total) by mouth daily.   [DISCONTINUED] ferrous sulfate 325 (65 FE) MG tablet Take 325 mg by mouth daily.    No facility-administered encounter medications on file as of 03/05/2022.     Surgical History: Past Surgical History:  Procedure Laterality Date   CATARACT EXTRACTION     COLONOSCOPY WITH PROPOFOL N/A 07/02/2019   Procedure: COLONOSCOPY WITH PROPOFOL;  Surgeon: Lin Landsman, MD;  Location: [facility] ENDOSCOPY;  Service: Gastroenterology;  Laterality: N/A;   IR CT HEAD LTD  08/27/2019   IR PERCUTANEOUS ART THROMBECTOMY/INFUSION INTRACRANIAL INC DIAG ANGIO  08/27/2019       IR PERCUTANEOUS ART THROMBECTOMY/INFUSION INTRACRANIAL INC DIAG ANGIO  08/27/2019   LOWER EXTREMITY ANGIOGRAPHY Left 12/25/2020   Procedure: Lower Extremity Angiography;  Surgeon: Algernon Huxley, MD;  Location: [place] [facility];  Service: Cardiovascular;  Laterality: Left;   PACEMAKER LEADLESS INSERTION N/A 12/19/2020   Procedure: PACEMAKER LEADLESS INSERTION;  Surgeon: Isaias Cowman, MD;  Location: [place] [facility];  Service: Cardiovascular;  Laterality: N/A;   PROSTATE CANCER     PROSTATE SURGERY     RADIOLOGY WITH ANESTHESIA N/A 08/27/2019   Procedure: IR WITH ANESTHESIA;  Surgeon: Luanne Bras, MD;  Location: [place];  Service: Radiology;  Laterality: N/A;    Medical History: Past Medical History:  Diagnosis Date   Atrial fibrillation ([place])    CHF (congestive heart failure) ([place])    Diabetes ([place])    Hearing loss    High blood pressure    History of bladder problems    Kidney stones 11/22/2014   Prostate cancer ([place])  Prostate cancer ([place])    Stroke [facility])     Family History: Family History  Problem Relation Age of Onset   Colon cancer Mother    Diabetes Other    High blood pressure Other    Prostate cancer Brother    Diabetes Brother     Social History   Socioeconomic History   Marital status: Married    Spouse name: Frazier Metzker   Number of children: 2   Years of education: 12   Highest education level: 12th grade  Occupational History   Occupation: [employer]  Tobacco Use   Smoking status: Former    Years: 16.00    Types: Cigarettes    Smokeless tobacco: Never  Vaping Use   Vaping Use: Never used  Substance and Sexual Activity   Alcohol use: No   Drug use: No   Sexual activity: Not Currently  Other Topics Concern   Not on file  Social History Narrative   Not on file   Social Determinants of Health   Financial Resource Strain: Low Risk  (10/29/2019)   Overall Financial Resource Strain (CARDIA)    Difficulty of Paying Living Expenses: Not very hard  Recent Concern: Financial Resource Strain - Medium Risk (09/08/2019)   Overall Financial Resource Strain (CARDIA)    Difficulty of Paying Living Expenses: Somewhat hard  Food Insecurity: No Food Insecurity (10/04/2021)   Hunger Vital Sign    Worried About Running Out of Food in the Last Year: Never true    Ran Out of Food in the Last Year: Never true  Transportation Needs: No Transportation Needs (10/04/2021)   PRAPARE - Hydrologist (Medical): No    Lack of Transportation (Non-Medical): No  Physical Activity: Inactive (10/29/2019)   Exercise Vital Sign    Days of Exercise per Week: 0 days    Minutes of Exercise per Session: 0 min  Stress: No Stress Concern Present (10/29/2019)   [place]    Feeling of Stress : Only a little  Recent Concern: Stress - Stress Concern Present (09/08/2019)   [place]    Feeling of Stress : To some extent  Social Connections: Moderately Integrated (10/29/2019)   Social Connection and Isolation Panel [NHANES]    Frequency of Communication with Friends and Family: More than three times a week    Frequency of Social Gatherings with Friends and Family: More than three times a week    Attends Religious Services: More than 4 times per year    Active Member of [employer] or Organizations: No    Attends Archivist Meetings: Never    Marital Status: Married  Human resources officer  Violence: Not At Risk (10/04/2021)   Humiliation, Afraid, Rape, and Kick questionnaire    Fear of Current or Ex-Partner: No    Emotionally Abused: No    Physically Abused: No    Sexually Abused: No      Review of Systems  Constitutional:  Negative for fatigue and fever.  HENT:  Negative for congestion, mouth sores and postnasal drip.   Respiratory:  Negative for cough.   Cardiovascular:  Negative for chest pain.  Genitourinary:  Negative for flank pain.  Musculoskeletal:  Positive for gait problem and joint swelling.  Psychiatric/Behavioral: Negative.      Vital Signs: BP 138/78 Comment: 151/117  Pulse 88   Temp 98 F (36.7 C)   Resp  16   Ht 6' (1.829 m)   Wt 240 lb 9.6 oz (109.1 kg)   SpO2 97%   BMI 32.63 kg/m    Physical Exam Constitutional:      Appearance: Normal appearance.  HENT:     Head: Normocephalic and atraumatic.     Nose: Nose normal.     Mouth/Throat:     Mouth: Mucous membranes are moist.     Pharynx: No posterior oropharyngeal erythema.  Eyes:     Extraocular Movements: Extraocular movements intact.     Pupils: Pupils are equal, round, and reactive to light.  Cardiovascular:     Pulses: Normal pulses.     Heart sounds: Normal heart sounds.  Pulmonary:     Effort: Pulmonary effort is normal.     Breath sounds: Normal breath sounds.  Musculoskeletal:        General: Swelling and tenderness present.     Comments: Left ankle tenderness, mild soft tissue swelling   Neurological:     General: No focal deficit present.     Mental Status: He is alert.  Psychiatric:        Mood and Affect: Mood normal.        Behavior: Behavior normal.      Assessment/Plan: 1. Acute left ankle pain There is no evidence of DVT, localized tenderness and swelling, ?? Intrasynovial crystals, gout or pseudogout, will check uric acid, however will treat as gout/pseudogout for now, not a candidate for NSAID due CKD, will start colchicine  - Sed Rate (ESR) - Uric  acid - colchicine 0.6 MG tablet; Take one tab po bid for ankle pain  Dispense: 45 tablet; Refill: 2  2. Decreased ambulation status Pt has difficulty walking due to pain   3. Type 2 diabetes mellitus with hyperglycemia, without long-term current use of insulin (HCC) Continue current medications   4. Encounter for examination following treatment at hospital Follow up ED visit, all records reviewed    General Counseling: Nihal verbalizes understanding of the findings of todays visit and agrees with plan of treatment. I have discussed any further diagnostic evaluation that may be needed or ordered today. We also reviewed his medications today. he has been encouraged to call the office with any questions or concerns that should arise related to todays visit.    Counseling:  [place] Controlled Substance Database was reviewed by me.  Orders Placed This Encounter  Procedures   Sed Rate (ESR)   Uric acid      I have reviewed all medical records from hospital follow up including radiology reports and consults from other physicians. Appropriate follow up diagnostics will be scheduled as needed. Patient/ Family understands the plan of treatment. Time spent45 minutes.   Dr Lavera Guise, MD Internal Medicine

## 2022-03-06 LAB — SEDIMENTATION RATE: Sed Rate: 25 mm/hr (ref 0–30)

## 2022-03-06 LAB — URIC ACID: Uric Acid: 6.3 mg/dL (ref 3.8–8.4)

## 2022-03-06 NOTE — Progress Notes (Signed)
Please check with Pt or his wife how is ankle feeling  Thank you

## 2022-03-07 ENCOUNTER — Other Ambulatory Visit: Payer: Self-pay

## 2022-03-07 ENCOUNTER — Telehealth: Payer: Self-pay | Admitting: Nurse Practitioner

## 2022-03-07 ENCOUNTER — Telehealth: Payer: Self-pay

## 2022-03-07 DIAGNOSIS — D649 Anemia, unspecified: Secondary | ICD-10-CM

## 2022-03-07 MED ORDER — FERROUS SULFATE 325 (65 FE) MG PO TABS: 325.0000 mg | ORAL_TABLET | Freq: Every day | ORAL | 1 refills | Status: DC

## 2022-03-07 NOTE — Progress Notes (Signed)
Normal

## 2022-03-07 NOTE — Telephone Encounter (Signed)
Pt wife advised labs look good and just take med prescribed and follow low protein and vegetables

## 2022-03-07 NOTE — Telephone Encounter (Signed)
Pt wife advised by Mervin Kung that we send med as per dfk

## 2022-03-08 ENCOUNTER — Other Ambulatory Visit (INDEPENDENT_AMBULATORY_CARE_PROVIDER_SITE_OTHER): Payer: Self-pay | Admitting: Nurse Practitioner

## 2022-03-08 DIAGNOSIS — M79605 Pain in left leg: Secondary | ICD-10-CM

## 2022-03-08 DIAGNOSIS — I739 Peripheral vascular disease, unspecified: Secondary | ICD-10-CM

## 2022-03-11 ENCOUNTER — Inpatient Hospital Stay: Payer: Medicare Other | Attending: Oncology

## 2022-03-11 DIAGNOSIS — C61 Malignant neoplasm of prostate: Secondary | ICD-10-CM | POA: Diagnosis present

## 2022-03-11 DIAGNOSIS — Z9079 Acquired absence of other genital organ(s): Secondary | ICD-10-CM | POA: Diagnosis not present

## 2022-03-11 DIAGNOSIS — Z7901 Long term (current) use of anticoagulants: Secondary | ICD-10-CM | POA: Diagnosis not present

## 2022-03-11 DIAGNOSIS — Z79818 Long term (current) use of other agents affecting estrogen receptors and estrogen levels: Secondary | ICD-10-CM | POA: Diagnosis present

## 2022-03-11 DIAGNOSIS — I4891 Unspecified atrial fibrillation: Secondary | ICD-10-CM | POA: Insufficient documentation

## 2022-03-11 DIAGNOSIS — Z79899 Other long term (current) drug therapy: Secondary | ICD-10-CM | POA: Insufficient documentation

## 2022-03-11 DIAGNOSIS — N184 Chronic kidney disease, stage 4 (severe): Secondary | ICD-10-CM | POA: Insufficient documentation

## 2022-03-11 DIAGNOSIS — C772 Secondary and unspecified malignant neoplasm of intra-abdominal lymph nodes: Secondary | ICD-10-CM | POA: Diagnosis not present

## 2022-03-11 DIAGNOSIS — Z87891 Personal history of nicotine dependence: Secondary | ICD-10-CM | POA: Insufficient documentation

## 2022-03-11 DIAGNOSIS — E1122 Type 2 diabetes mellitus with diabetic chronic kidney disease: Secondary | ICD-10-CM | POA: Insufficient documentation

## 2022-03-11 DIAGNOSIS — Z7984 Long term (current) use of oral hypoglycemic drugs: Secondary | ICD-10-CM | POA: Insufficient documentation

## 2022-03-11 LAB — COMPREHENSIVE METABOLIC PANEL
ALT: 12 U/L (ref 0–44)
AST: 17 U/L (ref 15–41)
Albumin: 3.8 g/dL (ref 3.5–5.0)
Alkaline Phosphatase: 142 U/L — ABNORMAL HIGH (ref 38–126)
Anion gap: 10 (ref 5–15)
BUN: 37 mg/dL — ABNORMAL HIGH (ref 8–23)
CO2: 24 mmol/L (ref 22–32)
Calcium: 9.2 mg/dL (ref 8.9–10.3)
Chloride: 100 mmol/L (ref 98–111)
Creatinine, Ser: 2.41 mg/dL — ABNORMAL HIGH (ref 0.61–1.24)
GFR, Estimated: 26 mL/min — ABNORMAL LOW (ref >60)
Glucose, Bld: 183 mg/dL — ABNORMAL HIGH (ref 70–99)
Potassium: 4.3 mmol/L (ref 3.5–5.1)
Sodium: 134 mmol/L — ABNORMAL LOW (ref 135–145)
Total Bilirubin: 0.6 mg/dL (ref 0.3–1.2)
Total Protein: 8.3 g/dL — ABNORMAL HIGH (ref 6.5–8.1)

## 2022-03-11 LAB — CBC WITH DIFFERENTIAL/PLATELET
Abs Immature Granulocytes: 0.02 10*3/uL (ref 0.00–0.07)
Basophils Absolute: 0 10*3/uL (ref 0.0–0.1)
Basophils Relative: 1 %
Eosinophils Absolute: 0.1 10*3/uL (ref 0.0–0.5)
Eosinophils Relative: 2 %
HCT: 51.2 % (ref 39.0–52.0)
Hemoglobin: 16.1 g/dL (ref 13.0–17.0)
Immature Granulocytes: 0 %
Lymphocytes Relative: 32 %
Lymphs Abs: 2 10*3/uL (ref 0.7–4.0)
MCH: 29.8 pg (ref 26.0–34.0)
MCHC: 31.4 g/dL (ref 30.0–36.0)
MCV: 94.6 fL (ref 80.0–100.0)
Monocytes Absolute: 0.8 10*3/uL (ref 0.1–1.0)
Monocytes Relative: 12 %
Neutro Abs: 3.2 10*3/uL (ref 1.7–7.7)
Neutrophils Relative %: 53 %
Platelets: 191 10*3/uL (ref 150–400)
RBC: 5.41 MIL/uL (ref 4.22–5.81)
RDW: 14.8 % (ref 11.5–15.5)
WBC: 6.1 10*3/uL (ref 4.0–10.5)
nRBC: 0 % (ref 0.0–0.2)

## 2022-03-11 LAB — PSA: Prostatic Specific Antigen: 5.73 ng/mL — ABNORMAL HIGH (ref 0.00–4.00)

## 2022-03-13 ENCOUNTER — Encounter: Payer: Self-pay | Admitting: Internal Medicine

## 2022-03-13 ENCOUNTER — Encounter (INDEPENDENT_AMBULATORY_CARE_PROVIDER_SITE_OTHER): Payer: Medicare Other

## 2022-03-18 ENCOUNTER — Encounter: Payer: Self-pay | Admitting: Oncology

## 2022-03-18 ENCOUNTER — Other Ambulatory Visit (HOSPITAL_COMMUNITY): Payer: Self-pay

## 2022-03-18 ENCOUNTER — Inpatient Hospital Stay (HOSPITAL_BASED_OUTPATIENT_CLINIC_OR_DEPARTMENT_OTHER): Payer: Medicare Other | Admitting: Oncology

## 2022-03-18 ENCOUNTER — Other Ambulatory Visit: Payer: Self-pay

## 2022-03-18 VITALS — BP 119/80 | HR 76 | Temp 96.5°F | Resp 18 | Ht 72.0 in | Wt 240.0 lb

## 2022-03-18 DIAGNOSIS — IMO0001 Reserved for inherently not codable concepts without codable children: Secondary | ICD-10-CM

## 2022-03-18 DIAGNOSIS — I4811 Longstanding persistent atrial fibrillation: Secondary | ICD-10-CM

## 2022-03-18 DIAGNOSIS — Z79899 Other long term (current) drug therapy: Secondary | ICD-10-CM | POA: Diagnosis not present

## 2022-03-18 DIAGNOSIS — C61 Malignant neoplasm of prostate: Secondary | ICD-10-CM | POA: Diagnosis not present

## 2022-03-18 DIAGNOSIS — I4891 Unspecified atrial fibrillation: Secondary | ICD-10-CM | POA: Diagnosis not present

## 2022-03-18 DIAGNOSIS — E1122 Type 2 diabetes mellitus with diabetic chronic kidney disease: Secondary | ICD-10-CM | POA: Diagnosis not present

## 2022-03-18 DIAGNOSIS — Z79818 Long term (current) use of other agents affecting estrogen receptors and estrogen levels: Secondary | ICD-10-CM | POA: Diagnosis not present

## 2022-03-18 DIAGNOSIS — Z7984 Long term (current) use of oral hypoglycemic drugs: Secondary | ICD-10-CM | POA: Diagnosis not present

## 2022-03-18 DIAGNOSIS — Z7901 Long term (current) use of anticoagulants: Secondary | ICD-10-CM | POA: Diagnosis not present

## 2022-03-18 DIAGNOSIS — N184 Chronic kidney disease, stage 4 (severe): Secondary | ICD-10-CM | POA: Diagnosis not present

## 2022-03-18 DIAGNOSIS — C772 Secondary and unspecified malignant neoplasm of intra-abdominal lymph nodes: Secondary | ICD-10-CM | POA: Diagnosis not present

## 2022-03-18 DIAGNOSIS — Z87891 Personal history of nicotine dependence: Secondary | ICD-10-CM | POA: Diagnosis not present

## 2022-03-18 MED ORDER — ENZALUTAMIDE 40 MG PO TABS
160.0000 mg | ORAL_TABLET | Freq: Every day | ORAL | 2 refills | Status: DC
  Filled 2022-03-18 – 2022-03-21 (×2): qty 120, 30d supply, fill #0
  Filled 2022-04-18: qty 120, 30d supply, fill #1
  Filled 2022-05-20: qty 120, 30d supply, fill #2

## 2022-03-18 NOTE — Assessment & Plan Note (Signed)
Eligard 45 mg every 6 months.- Eligard at next visit.

## 2022-03-18 NOTE — Assessment & Plan Note (Signed)
Encourage oral hydration and avoid nephrotoxins.   

## 2022-03-18 NOTE — Assessment & Plan Note (Signed)
Currently on Edoxaban, follow up with cardiology
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
## 2022-03-18 NOTE — Assessment & Plan Note (Addendum)
rostate cancer recurrence.  Non- regional (retroperitoneal) lymphadenopathy -M1a Labs reviewed and discussed with patient.  PSA progressively trending up to 83. 01/05/2021 Bone scan showed No convincing scintigraphic evidence of bony metastatic disease. 01/05/2021 Mild increase in size of left retroperitoneal, right common iliac and right external iliac lymph nodes compatible with progression of disease.No CT findings of osseous metastasis. Patient is on androgen deprivation therapy with Eligard 45 mg every 6 months- next due June 2024 Clinically he tolerates Xtandi '160mg'$  daily well. Continue current regimen PSA has improved to 5.7

## 2022-03-19 ENCOUNTER — Encounter
Admission: EM | Disposition: A | Discharge status: Home / Self Care | Payer: Self-pay | Source: Home / Self Care | Attending: Emergency Medicine

## 2022-03-19 ENCOUNTER — Other Ambulatory Visit: Payer: Self-pay

## 2022-03-19 ENCOUNTER — Emergency Department: Payer: Medicare Other | Admitting: Anesthesiology

## 2022-03-19 ENCOUNTER — Encounter: Payer: Self-pay | Admitting: Emergency Medicine

## 2022-03-19 ENCOUNTER — Ambulatory Visit
Admission: EM | Admit: 2022-03-19 | Discharge: 2022-03-19 | Disposition: A | Discharge status: Home / Self Care | Payer: Medicare Other | Attending: Emergency Medicine | Admitting: Emergency Medicine

## 2022-03-19 DIAGNOSIS — G4730 Sleep apnea, unspecified: Secondary | ICD-10-CM | POA: Insufficient documentation

## 2022-03-19 DIAGNOSIS — K56699 Other intestinal obstruction unspecified as to partial versus complete obstruction: Secondary | ICD-10-CM | POA: Diagnosis not present

## 2022-03-19 DIAGNOSIS — N189 Chronic kidney disease, unspecified: Secondary | ICD-10-CM | POA: Insufficient documentation

## 2022-03-19 DIAGNOSIS — Z8679 Personal history of other diseases of the circulatory system: Secondary | ICD-10-CM | POA: Insufficient documentation

## 2022-03-19 DIAGNOSIS — E1151 Type 2 diabetes mellitus with diabetic peripheral angiopathy without gangrene: Secondary | ICD-10-CM | POA: Insufficient documentation

## 2022-03-19 DIAGNOSIS — Z7984 Long term (current) use of oral hypoglycemic drugs: Secondary | ICD-10-CM | POA: Diagnosis not present

## 2022-03-19 DIAGNOSIS — I130 Hypertensive heart and chronic kidney disease with heart failure and stage 1 through stage 4 chronic kidney disease, or unspecified chronic kidney disease: Secondary | ICD-10-CM | POA: Diagnosis not present

## 2022-03-19 DIAGNOSIS — I639 Cerebral infarction, unspecified: Secondary | ICD-10-CM | POA: Diagnosis not present

## 2022-03-19 DIAGNOSIS — J449 Chronic obstructive pulmonary disease, unspecified: Secondary | ICD-10-CM | POA: Insufficient documentation

## 2022-03-19 DIAGNOSIS — Z743 Need for continuous supervision: Secondary | ICD-10-CM | POA: Diagnosis not present

## 2022-03-19 DIAGNOSIS — I509 Heart failure, unspecified: Secondary | ICD-10-CM | POA: Diagnosis not present

## 2022-03-19 DIAGNOSIS — I4891 Unspecified atrial fibrillation: Secondary | ICD-10-CM | POA: Insufficient documentation

## 2022-03-19 DIAGNOSIS — Z87891 Personal history of nicotine dependence: Secondary | ICD-10-CM | POA: Diagnosis not present

## 2022-03-19 DIAGNOSIS — R1310 Dysphagia, unspecified: Secondary | ICD-10-CM | POA: Diagnosis not present

## 2022-03-19 DIAGNOSIS — W44F3XA Food entering into or through a natural orifice, initial encounter: Secondary | ICD-10-CM

## 2022-03-19 DIAGNOSIS — J988 Other specified respiratory disorders: Secondary | ICD-10-CM | POA: Diagnosis present

## 2022-03-19 DIAGNOSIS — E1122 Type 2 diabetes mellitus with diabetic chronic kidney disease: Secondary | ICD-10-CM | POA: Insufficient documentation

## 2022-03-19 DIAGNOSIS — T17920A Food in respiratory tract, part unspecified causing asphyxiation, initial encounter: Secondary | ICD-10-CM | POA: Diagnosis not present

## 2022-03-19 DIAGNOSIS — Z7901 Long term (current) use of anticoagulants: Secondary | ICD-10-CM | POA: Insufficient documentation

## 2022-03-19 DIAGNOSIS — T18128A Food in esophagus causing other injury, initial encounter: Secondary | ICD-10-CM | POA: Diagnosis not present

## 2022-03-19 HISTORY — PX: ESOPHAGOGASTRODUODENOSCOPY (EGD) WITH PROPOFOL: SHX5813

## 2022-03-19 LAB — CBG MONITORING, ED: Glucose-Capillary: 128 mg/dL — ABNORMAL HIGH (ref 70–99)

## 2022-03-19 SURGERY — ESOPHAGOGASTRODUODENOSCOPY (EGD) WITH PROPOFOL
Anesthesia: General

## 2022-03-19 MED ORDER — FENTANYL CITRATE (PF) 100 MCG/2ML IJ SOLN
INTRAMUSCULAR | Status: DC | PRN
  Administered 2022-03-19: 25 ug via INTRAVENOUS

## 2022-03-19 MED ORDER — DEXAMETHASONE SODIUM PHOSPHATE 10 MG/ML IJ SOLN
INTRAMUSCULAR | Status: AC
  Filled 2022-03-19: qty 1

## 2022-03-19 MED ORDER — ALUM & MAG HYDROXIDE-SIMETH 200-200-20 MG/5ML PO SUSP
30.0000 mL | Freq: Once | ORAL | Status: AC
  Administered 2022-03-19: 30 mL via ORAL
  Filled 2022-03-19: qty 30

## 2022-03-19 MED ORDER — PHENYLEPHRINE 80 MCG/ML (10ML) SYRINGE FOR IV PUSH (FOR BLOOD PRESSURE SUPPORT)
PREFILLED_SYRINGE | INTRAVENOUS | Status: AC
  Filled 2022-03-19: qty 10

## 2022-03-19 MED ORDER — ONDANSETRON HCL 4 MG/2ML IJ SOLN
INTRAMUSCULAR | Status: DC | PRN
  Administered 2022-03-19: 4 mg via INTRAVENOUS

## 2022-03-19 MED ORDER — FENTANYL CITRATE (PF) 100 MCG/2ML IJ SOLN
INTRAMUSCULAR | Status: AC
  Filled 2022-03-19: qty 2

## 2022-03-19 MED ORDER — PROPOFOL 10 MG/ML IV BOLUS
INTRAVENOUS | Status: DC | PRN
  Administered 2022-03-19: 100 mg via INTRAVENOUS

## 2022-03-19 MED ORDER — GLUCAGON HCL RDNA (DIAGNOSTIC) 1 MG IJ SOLR
1.0000 mg | Freq: Once | INTRAMUSCULAR | Status: AC
  Administered 2022-03-19: 1 mg via INTRAVENOUS
  Filled 2022-03-19: qty 1

## 2022-03-19 MED ORDER — SUCCINYLCHOLINE CHLORIDE 200 MG/10ML IV SOSY
PREFILLED_SYRINGE | INTRAVENOUS | Status: DC | PRN
  Administered 2022-03-19: 120 mg via INTRAVENOUS

## 2022-03-19 MED ORDER — LIDOCAINE HCL (PF) 2 % IJ SOLN
INTRAMUSCULAR | Status: AC
  Filled 2022-03-19: qty 5

## 2022-03-19 MED ORDER — LIDOCAINE HCL (CARDIAC) PF 100 MG/5ML IV SOSY
PREFILLED_SYRINGE | INTRAVENOUS | Status: DC | PRN
  Administered 2022-03-19: 100 mg via INTRAVENOUS

## 2022-03-19 MED ORDER — DEXAMETHASONE SODIUM PHOSPHATE 10 MG/ML IJ SOLN
INTRAMUSCULAR | Status: DC | PRN
  Administered 2022-03-19: 10 mg via INTRAVENOUS

## 2022-03-19 MED ORDER — SUCCINYLCHOLINE CHLORIDE 200 MG/10ML IV SOSY
PREFILLED_SYRINGE | INTRAVENOUS | Status: AC
  Filled 2022-03-19: qty 10

## 2022-03-19 MED ORDER — ONDANSETRON HCL 4 MG/2ML IJ SOLN
INTRAMUSCULAR | Status: AC
  Filled 2022-03-19: qty 2

## 2022-03-19 MED ORDER — SODIUM CHLORIDE 0.9 % IV SOLN: INTRAVENOUS | Status: DC | PRN

## 2022-03-19 MED ORDER — LIDOCAINE VISCOUS HCL 2 % MT SOLN
15.0000 mL | Freq: Once | OROMUCOSAL | Status: AC
  Administered 2022-03-19: 15 mL via ORAL
  Filled 2022-03-19: qty 15

## 2022-03-19 NOTE — Anesthesia Preprocedure Evaluation (Addendum)
Anesthesia Evaluation  Patient identified by MRN, date of birth, ID band Patient awake    Reviewed: Allergy & Precautions, NPO status , Patient's Chart, lab work & pertinent test results  Airway Mallampati: III  TM Distance: >3 FB Neck ROM: full    Dental  (+) Edentulous Upper, Edentulous Lower   Pulmonary sleep apnea , COPD, former smoker   Pulmonary exam normal        Cardiovascular hypertension, On Medications + Peripheral Vascular Disease and +CHF  + dysrhythmias Atrial Fibrillation   AAA   Neuro/Psych CVA  negative psych ROS   GI/Hepatic negative GI ROS, Neg liver ROS,,,  Endo/Other  diabetes    Renal/GU Renal disease     Musculoskeletal   Abdominal   Peds  Hematology negative hematology ROS (+) On eliquis    Anesthesia Other Findings Past Medical History: No date: Atrial fibrillation (HCC) No date: CHF (congestive heart failure) (HCC) No date: Diabetes ([place]) No date: Hearing loss No date: High blood pressure No date: History of bladder problems 11/22/2014: Kidney stones No date: Prostate cancer ([place]) No date: Prostate cancer ([place]) No date: Stroke [facility])  Past Surgical History: No date: CATARACT EXTRACTION 07/02/2019: COLONOSCOPY WITH PROPOFOL; N/A     Comment:  Procedure: COLONOSCOPY WITH PROPOFOL;  Surgeon: Lin Landsman, MD;  Location: [facility] ENDOSCOPY;  Service:               Gastroenterology;  Laterality: N/A; 08/27/2019: IR CT HEAD LTD 08/27/2019: IR PERCUTANEOUS ART THROMBECTOMY/INFUSION INTRACRANIAL INC  DIAG ANGIO     Comment:    08/27/2019: IR PERCUTANEOUS ART THROMBECTOMY/INFUSION INTRACRANIAL INC  DIAG ANGIO 12/25/2020: LOWER EXTREMITY ANGIOGRAPHY; Left     Comment:  Procedure: Lower Extremity Angiography;  Surgeon: Algernon Huxley, MD;  Location: [place] CV LAB;  Service:               Cardiovascular;  Laterality: Left; 12/19/2020: PACEMAKER LEADLESS  INSERTION; N/A     Comment:  Procedure: PACEMAKER LEADLESS INSERTION;  Surgeon:               Isaias Cowman, MD;  Location: [place] CV               LAB;  Service: Cardiovascular;  Laterality: N/A; No date: PROSTATE CANCER No date: PROSTATE SURGERY 08/27/2019: RADIOLOGY WITH ANESTHESIA; N/A     Comment:  Procedure: IR WITH ANESTHESIA;  Surgeon: Luanne Bras, MD;  Location: [place];  Service: Radiology;                Laterality: N/A;  BMI    Body Mass Index: 32.96 kg/m      Reproductive/Obstetrics negative OB ROS                             Anesthesia Physical Anesthesia Plan  ASA: 4  Anesthesia Plan: General ETT   Post-op Pain Management:    Induction: Intravenous  PONV Risk Score and Plan: Ondansetron, Dexamethasone, Midazolam and Treatment may vary due to age or medical condition  Airway Management Planned: Oral ETT  Additional Equipment:   Intra-op Plan:   Post-operative Plan: Extubation in OR  Informed Consent: I have reviewed the patients  History and Physical, chart, labs and discussed the procedure including the risks, benefits and alternatives for the proposed anesthesia with the patient or authorized representative who has indicated his/her understanding and acceptance.     Dental Advisory Given  Plan Discussed with: Anesthesiologist, CRNA and Surgeon  Anesthesia Plan Comments: (Patient consented for risks of anesthesia including but not limited to:  - adverse reactions to medications - damage to eyes, teeth, lips or other oral mucosa - nerve damage due to positioning  - sore throat or hoarseness - Damage to heart, brain, nerves, lungs, other parts of body or loss of life  Patient voiced understanding.)        Anesthesia Quick Evaluation

## 2022-03-19 NOTE — ED Notes (Addendum)
Pt drinking ginger ale without difficulty and able to swallow.

## 2022-03-19 NOTE — Anesthesia Procedure Notes (Signed)
Procedure Name: Intubation Date/Time: 03/19/2022 2:40 PM  Performed by: Doreen Salvage, CRNAPre-anesthesia Checklist: Patient identified, Emergency Drugs available, Suction available and Patient being monitored Patient Re-evaluated:Patient Re-evaluated prior to induction Oxygen Delivery Method: Circle system utilized Preoxygenation: Pre-oxygenation with 100% oxygen Induction Type: IV induction, Cricoid Pressure applied and Rapid sequence Ventilation: Mask ventilation without difficulty Laryngoscope Size: Mac and 3 Grade View: Grade II Tube type: Oral Tube size: 7.0 mm Number of attempts: 1 Airway Equipment and Method: Stylet Placement Confirmation: ETT inserted through vocal cords under direct vision, positive ETCO2 and breath sounds checked- equal and bilateral Secured at: 21 cm Tube secured with: Tape Dental Injury: Teeth and Oropharynx as per pre-operative assessment

## 2022-03-19 NOTE — ED Provider Notes (Signed)
[facility] Provider Note    Event Date/Time   First MD Initiated Contact with Patient 03/19/22 1041     (approximate)   History   Chief Complaint Airway Obstruction   HPI  RAMEER BRUCKMAN is a 89 y.o. male with past medical history of hypertension, diabetes, atrial fibrillation on Eliquis, CKD, CHF, AAA who presents to the ED complaining of difficulty swallowing.  Patient reports that he was eating an apple just prior to arrival when he felt to get stuck in the back of his throat.  His coworker briefly perform the Heimlich maneuver on him, but he denies any difficulty breathing and was speaking normally prior to receiving time with maneuver.  He reports a small chunk of apple came up afterwards, but he continues to feel like something is stuck in the back of his throat.  He continues to deny difficulty breathing, states he has been able to swallow his spit without difficulty.  He denies any similar issues in the past.     Physical Exam   Triage Vital Signs: ED Triage Vitals  Enc Vitals Group     BP 03/19/22 1044 127/79     Pulse Rate 03/19/22 1043 75     Resp 03/19/22 1043 (!) 23     Temp 03/19/22 1046 98.1 F (36.7 C)     Temp Source 03/19/22 1043 Oral     SpO2 03/19/22 1044 99 %     Weight 03/19/22 1044 243 lb (110.2 kg)     Height 03/19/22 1044 6' (1.829 m)     Head Circumference --      Peak Flow --      Pain Score 03/19/22 1043 0     Pain Loc --      Pain Edu? --      Excl. in [place]? --     Most recent vital signs: Vitals:   03/19/22 1130 03/19/22 1200  BP: 123/85 122/75  Pulse: 69 71  Resp: (!) 28 18  Temp:    SpO2: 98% 100%    Constitutional: Alert and oriented. Eyes: Conjunctivae are normal. Head: Atraumatic. Nose: No congestion/rhinnorhea. Mouth/Throat: Mucous membranes are moist.  Tolerating oral secretions without difficulty.  No foreign bodies noted in posterior oropharynx. Cardiovascular: Normal rate, regular rhythm.  Grossly normal heart sounds.  2+ radial pulses bilaterally. Respiratory: Normal respiratory effort.  No retractions. Lungs CTAB. Gastrointestinal: Soft and nontender. No distention. Musculoskeletal: No lower extremity tenderness nor edema.  Neurologic:  Normal speech and language. No gross focal neurologic deficits are appreciated.    ED Results / Procedures / Treatments   Labs (all labs ordered are listed, but only abnormal results are displayed) Labs Reviewed - No data to display   PROCEDURES:  Critical Care performed: No  Procedures   MEDICATIONS ORDERED IN ED: Medications  alum & mag hydroxide-simeth (MAALOX/MYLANTA) 200-200-20 MG/5ML suspension 30 mL (30 mLs Oral Given 03/19/22 1055)    And  lidocaine (XYLOCAINE) 2 % viscous mouth solution 15 mL (15 mLs Oral Given 03/19/22 1055)  glucagon (human recombinant) (GLUCAGEN) injection 1 mg (1 mg Intravenous Given 03/19/22 1134)     IMPRESSION / MDM / ASSESSMENT AND PLAN / ED COURSE  I reviewed the triage vital signs and the nursing notes.                              89 y.o. male with past medical history of  hypertension, diabetes, atrial fibrillation on Eliquis, CHF, CKD, and AAA who presents to the ED stating it feels like there is an apple stuck in the back of his throat, denies difficulty breathing.  Patient's presentation is most consistent with acute presentation with potential threat to life or bodily function.  Differential diagnosis includes, but is not limited to, esophageal food bolus, aspiration, airway foreign body, globus sensation.  Patient nontoxic-appearing and in no acute distress, vital signs are unremarkable.  Doubt significant aspiration or airway obstruction as patient is speaking without difficulty.  He is also tolerating his oral secretions without difficulty, was able to drink a full can of ginger ale without any vomiting.  He does report ongoing discomfort when doing so and spit up a small amount of  frothy sputum.  He may have partial esophageal obstruction, plan to discuss with GI.  Case discussed with Dr. Vicente Males of GI, who recommends proceeding with endoscopy to clear any possible obstruction.  Patient was given IV glucagon with no change in clinical status, continues to tolerate oral secretions without difficulty though.      FINAL CLINICAL IMPRESSION(S) / ED DIAGNOSES   Final diagnoses:  Food bolus obstruction of intestine ([place])     Rx / DC Orders   ED Discharge Orders     None        Note:  This document was prepared using Dragon voice recognition software and may include unintentional dictation errors.   Blake Divine, MD 03/19/22 1229

## 2022-03-19 NOTE — ED Notes (Signed)
Pt reports feeling the sensation of something stuck in his throat after ginger ale and GI Cocktail

## 2022-03-19 NOTE — ED Triage Notes (Signed)
Pt to ED via [facility] from work. Pt was eating an apple and choked. Pts co-worker did the heimlich maneuver and was able to get some of the apple out but patient feels like there is still some in this throat. Pt is able to maintain his airway at this time. Pt is currently in NAD.     VSS with EMS.

## 2022-03-19 NOTE — Transfer of Care (Signed)
Immediate Anesthesia Transfer of Care Note  Patient: [name]  Procedure(s) Performed: Procedure(s): ESOPHAGOGASTRODUODENOSCOPY (EGD) WITH PROPOFOL (N/A)  Patient Location: PACU and Endoscopy Unit  Anesthesia Type:General  Level of Consciousness: sedated  Airway & Oxygen Therapy: Patient Spontanous Breathing and Patient connected to nasal cannula oxygen  Post-op Assessment: Report given to RN and Post -op Vital signs reviewed and stable  Post vital signs: Reviewed and stable  Last Vitals:  Vitals:   03/19/22 1300 03/19/22 1457  BP: 134/84 109/66  Pulse: 70 70  Resp: 17 17  Temp:  (!) 36.1 C  SpO2: 0000000 0000000    Complications: No apparent anesthesia complications

## 2022-03-19 NOTE — Op Note (Signed)
[facility] Gastroenterology Patient Name: [name] Procedure Date: 03/19/2022 2:11 PM MRN: [id number] Account #: [id number] Date of Birth: [birth year] Admit Type: Outpatient Age: 89 Room: [facility] ENDO ROOM 1 Gender: Male Note Status: Finalized Instrument Name: Upper Endoscope [id number] Procedure:             Upper GI endoscopy Indications:           Dysphagia Providers:             Jonathon Bellows MD, MD Referring MD:          Jonetta Osgood (Referring MD) Medicines:             Monitored Anesthesia Care Complications:         No immediate complications. Procedure:             Pre-Anesthesia Assessment:                        - Prior to the procedure, a History and Physical was                         performed, and patient medications, allergies and                         sensitivities were reviewed. The patient's tolerance                         of previous anesthesia was reviewed.                        - The risks and benefits of the procedure and the                         sedation options and risks were discussed with the                         patient. All questions were answered and informed                         consent was obtained.                        - ASA Grade Assessment: II - A patient with mild                         systemic disease.                        After obtaining informed consent, the endoscope was                         passed under direct vision. Throughout the procedure,                         the patient's blood pressure, pulse, and oxygen                         saturations were monitored continuously. The Endoscope                         was introduced through the  mouth, and advanced to the                         third part of duodenum. The upper GI endoscopy was                         accomplished with ease. The patient tolerated the                         procedure well. Findings:      The esophagus was normal.       The stomach was normal.      The examined duodenum was normal. Impression:            - Normal esophagus.                        - Normal stomach.                        - Normal examined duodenum.                        - No specimens collected. Recommendation:        - Discharge patient to home (with escort).                        - Resume previous diet.                        - Continue present medications.                        - Return to my office PRN. Procedure Code(s):     --- Professional ---                        [id number], Esophagogastroduodenoscopy, flexible,                         transoral; diagnostic, including collection of                         specimen(s) by brushing or washing, when performed                         (separate procedure) Diagnosis Code(s):     --- Professional ---                        R13.10, Dysphagia, unspecified CPT copyright 2022 American Medical Association. All rights reserved. The codes documented in this report are preliminary and upon coder review may  be revised to meet current compliance requirements. Jonathon Bellows, MD Jonathon Bellows MD, MD 03/19/2022 2:41:55 PM This report has been signed electronically. Number of Addenda: 0 Note Initiated On: 03/19/2022 2:11 PM Estimated Blood Loss:  Estimated blood loss: none.      [facility]

## 2022-03-19 NOTE — Consult Note (Signed)
[name] , MD [address], [place], [place], [place], [address] [address], [place], [place], [place], [address] Phone: [phone]  Fax: [phone]  Consultation  Referring Provider:    Dr. Charna Archer Primary Care Physician:  Jonetta Osgood, NP Primary Gastroenterologist:  Dr. Marius Ditch Reason for Consultation:  dysphagia  Date of Admission:  03/19/2022 Date of Consultation:  03/19/2022         HPI:   [name] is a 89 y.o. male with a history of fibrillation on Eliquis CKD came in with a complaint of difficulty swallowing after having an apple.  Felt it was stuck in the back of his throat his coworker performed Heimlich maneuver small amount came out but he continues to feel something stuck in the back of his throat.  At present feels comfortable able to speak complete sentences and tolerated swallows. No similar episode in the past.     Past Medical History:  Diagnosis Date   Atrial fibrillation (HCC)    CHF (congestive heart failure) (HCC)    Diabetes ([place])    Hearing loss    High blood pressure    History of bladder problems    Kidney stones 11/22/2014   Prostate cancer ([place])    Prostate cancer ([place])    Stroke [facility])     Past Surgical History:  Procedure Laterality Date   CATARACT EXTRACTION     COLONOSCOPY WITH PROPOFOL N/A 07/02/2019   Procedure: COLONOSCOPY WITH PROPOFOL;  Surgeon: Lin Landsman, MD;  Location: [facility] ENDOSCOPY;  Service: Gastroenterology;  Laterality: N/A;   IR CT HEAD LTD  08/27/2019   IR PERCUTANEOUS ART THROMBECTOMY/INFUSION INTRACRANIAL INC DIAG ANGIO  08/27/2019       IR PERCUTANEOUS ART THROMBECTOMY/INFUSION INTRACRANIAL INC DIAG ANGIO  08/27/2019   LOWER EXTREMITY ANGIOGRAPHY Left 12/25/2020   Procedure: Lower Extremity Angiography;  Surgeon: Algernon Huxley, MD;  Location: [place] CV LAB;  Service: Cardiovascular;  Laterality: Left;   PACEMAKER LEADLESS INSERTION N/A 12/19/2020   Procedure: PACEMAKER LEADLESS INSERTION;   Surgeon: Isaias Cowman, MD;  Location: [place] CV LAB;  Service: Cardiovascular;  Laterality: N/A;   PROSTATE CANCER     PROSTATE SURGERY     RADIOLOGY WITH ANESTHESIA N/A 08/27/2019   Procedure: IR WITH ANESTHESIA;  Surgeon: Luanne Bras, MD;  Location: [place];  Service: Radiology;  Laterality: N/A;    Prior to Admission medications   Medication Sig Start Date End Date Taking? Authorizing Provider  albuterol (VENTOLIN HFA) 108 (90 Base) MCG/ACT inhaler Inhale 2 puffs into the lungs every 4 (four) hours as needed for wheezing or shortness of breath. 03/16/21   Jonetta Osgood, NP  atorvastatin (LIPITOR) 10 MG tablet TAKE 1 TABLET(10 MG) BY MOUTH DAILY 12/20/21   Jonetta Osgood, NP  benzonatate (TESSALON PERLES) 100 MG capsule Take 1 capsule (100 mg total) by mouth 3 (three) times daily as needed for cough. 11/13/21 11/22/24  Teodoro Spray, PA  colchicine 0.6 MG tablet Take one tab po bid for ankle pain 03/05/22   Lavera Guise, MD  edoxaban (SAVAYSA) 30 MG TABS tablet Take by mouth. 11/01/21   [provider]  ELIQUIS 5 MG TABS tablet Take 5 mg by mouth 2 (two) times daily. 09/27/21   [provider]  enzalutamide Gillermina Phy) 40 MG tablet Take 4 tablets (160 mg total) by mouth daily. 03/18/22   Earlie Server, MD  FARXIGA 10 MG TABS tablet TAKE 1 TABLET(10 MG) BY MOUTH DAILY  BEFORE BREAKFAST 02/07/22   Jonetta Osgood, NP  ferrous sulfate 325 (65 FE) MG tablet Take 1 tablet (325 mg total) by mouth daily. 03/07/22   Lavera Guise, MD  glucose blood [facility] VERIO) test strip USE ONCE DAILY 02/04/22   Jonetta Osgood, NP  metoprolol tartrate 37.5 MG TABS Take 37.5 mg by mouth 2 (two) times daily. 10/06/21   Loletha Grayer, MD  pantoprazole (PROTONIX) 40 MG tablet TAKE 1 TABLET(40 MG) BY MOUTH DAILY 11/07/21   Jonetta Osgood, NP  SPIRIVA RESPIMAT 2.5 MCG/ACT AERS INHALE 2 PUFFS INTO THE LUNGS DAILY 01/10/22   Jonetta Osgood, NP  tamsulosin (FLOMAX) 0.4 MG  CAPS capsule Take 0.4 mg by mouth daily after breakfast. 10/06/21   [provider]  vitamin B-12 (CYANOCOBALAMIN) 1000 MCG tablet Take 1 tablet (1,000 mcg total) by mouth daily. 12/08/20   Jonetta Osgood, NP    Family History  Problem Relation Age of Onset   Colon cancer Mother    Diabetes Other    High blood pressure Other    Prostate cancer Brother    Diabetes Brother      Social History   Tobacco Use   Smoking status: Former    Years: 16.00    Types: Cigarettes   Smokeless tobacco: Never  Vaping Use   Vaping Use: Never used  Substance Use Topics   Alcohol use: No   Drug use: No    Allergies as of 03/19/2022   (No Known Allergies)    Review of Systems:    All systems reviewed and negative except where noted in HPI.   Physical Exam:  Vital signs in last 24 hours: Temp:  [96.5 F (35.8 C)-98.1 F (36.7 C)] 98.1 F (36.7 C) (02/27 1046) Pulse Rate:  [75-76] 75 (02/27 1043) Resp:  [18-23] 23 (02/27 1043) BP: (119-127)/(79-80) 127/79 (02/27 1044) SpO2:  [97 %-99 %] 99 % (02/27 1044) Weight:  [108.9 kg-110.2 kg] 110.2 kg (02/27 1044)   General:   Pleasant, cooperative in NAD Head:  Normocephalic and atraumatic. Eyes:   No icterus.   Conjunctiva pink. PERRLA. Ears:  Normal auditory acuity. Neck:  Supple; no masses or thyroidomegaly Lungs: Respirations even and unlabored. Lungs clear to auscultation bilaterally.   No wheezes, crackles, or rhonchi.  Heart:  Regular rate and rhythm;  Without murmur, clicks, rubs or gallops Abdomen:  Soft, nondistended, nontender. Normal bowel sounds. No appreciable masses or hepatomegaly.  No rebound or guarding.  Neurologic:  Alert and oriented x3;  grossly normal neurologically. Skin:  Intact without significant lesions or rashes. Cervical Nodes:  No significant cervical adenopathy. Psych:  Alert and cooperative. Normal affect.  LAB RESULTS: No results for input(s): "WBC", "HGB", "HCT", "PLT" in the last 72  hours. BMET No results for input(s): "NA", "K", "CL", "CO2", "GLUCOSE", "BUN", "CREATININE", "CALCIUM" in the last 72 hours. LFT No results for input(s): "PROT", "ALBUMIN", "AST", "ALT", "ALKPHOS", "BILITOT", "BILIDIR", "IBILI" in the last 72 hours. PT/INR No results for input(s): "LABPROT", "INR" in the last 72 hours.  STUDIES: No results found.    Impression / Plan:   [name] is a 89 y.o. y/o male with a history of atrial fibrillation on Eliquis presents the emergency room with dysphagia after eating a piece of apple concern for a food impaction.   Plan  EGD   I have discussed alternative options, risks & benefits,  which include, but are not limited to, bleeding, infection, perforation,respiratory complication & drug reaction.  The patient  agrees with this plan & written consent will be obtained.     Thank you for involving me in the care of this patient.      LOS: 0 days   Glenn Bellows, MD  03/19/2022, 11:26 AM

## 2022-03-20 ENCOUNTER — Encounter: Payer: Self-pay | Admitting: Gastroenterology

## 2022-03-20 NOTE — Anesthesia Postprocedure Evaluation (Signed)
Anesthesia Post Note  Patient: [name]  Procedure(s) Performed: ESOPHAGOGASTRODUODENOSCOPY (EGD) WITH PROPOFOL  Patient location during evaluation: PACU Anesthesia Type: General Level of consciousness: awake and alert Pain management: pain level controlled Vital Signs Assessment: post-procedure vital signs reviewed and stable Respiratory status: spontaneous breathing, nonlabored ventilation, respiratory function stable and patient connected to nasal cannula oxygen Cardiovascular status: blood pressure returned to baseline and stable Postop Assessment: no apparent nausea or vomiting Anesthetic complications: no   No notable events documented.   Last Vitals:  Vitals:   03/19/22 1517 03/19/22 1527  BP: 119/79 122/76  Pulse:    Resp:    Temp:    SpO2:      Last Pain:  Vitals:   03/20/22 0737  TempSrc:   PainSc: 0-No pain                 Ilene Qua

## 2022-03-21 ENCOUNTER — Other Ambulatory Visit: Payer: Self-pay

## 2022-03-21 ENCOUNTER — Other Ambulatory Visit (HOSPITAL_COMMUNITY): Payer: Self-pay

## 2022-03-21 DIAGNOSIS — G4733 Obstructive sleep apnea (adult) (pediatric): Secondary | ICD-10-CM | POA: Diagnosis not present

## 2022-03-26 ENCOUNTER — Other Ambulatory Visit (HOSPITAL_COMMUNITY): Payer: Self-pay

## 2022-03-29 ENCOUNTER — Encounter: Payer: Self-pay | Admitting: Nurse Practitioner

## 2022-03-29 ENCOUNTER — Ambulatory Visit (INDEPENDENT_AMBULATORY_CARE_PROVIDER_SITE_OTHER): Payer: Medicare Other | Admitting: Nurse Practitioner

## 2022-03-29 VITALS — BP 97/72 | HR 100 | Temp 97.5°F | Resp 16 | Ht 72.0 in | Wt 239.4 lb

## 2022-03-29 DIAGNOSIS — E1165 Type 2 diabetes mellitus with hyperglycemia: Secondary | ICD-10-CM | POA: Diagnosis not present

## 2022-03-29 DIAGNOSIS — K222 Esophageal obstruction: Secondary | ICD-10-CM

## 2022-03-29 DIAGNOSIS — I700 Atherosclerosis of aorta: Secondary | ICD-10-CM | POA: Diagnosis not present

## 2022-03-29 DIAGNOSIS — Z09 Encounter for follow-up examination after completed treatment for conditions other than malignant neoplasm: Secondary | ICD-10-CM | POA: Diagnosis not present

## 2022-03-29 LAB — POCT GLYCOSYLATED HEMOGLOBIN (HGB A1C): Hemoglobin A1C: 8.2 % — AB (ref 4.0–5.6)

## 2022-03-29 NOTE — Progress Notes (Signed)
[facility] [address], [address] [place] [id number] [facility] Discharge Acute Issues Care Follow Up                                                                        Patient Demographics  [name], is a 89 y.o. male  DOB Jul 28, 1933  MRN [id number].  Primary MD  Jonetta Osgood, NP   Reason for TCC follow Up - food obstruction in GI tract   Date of Admission: 03/19/2022 Date of Discharge: 03/19/2022   Past Medical History:  Diagnosis Date   Atrial fibrillation [facility])    CHF (congestive heart failure) ([place])    Diabetes ([place])    Hearing loss    High blood pressure    History of bladder problems    Kidney stones 11/22/2014   Prostate cancer ([place])    Prostate cancer ([place])    Stroke [facility])     Past Surgical History:  Procedure Laterality Date   CATARACT EXTRACTION     COLONOSCOPY WITH PROPOFOL N/A 07/02/2019   Procedure: COLONOSCOPY WITH PROPOFOL;  Surgeon: Lin Landsman, MD;  Location: [facility] ENDOSCOPY;  Service: Gastroenterology;  Laterality: N/A;   ESOPHAGOGASTRODUODENOSCOPY (EGD) WITH PROPOFOL N/A 03/19/2022   Procedure: ESOPHAGOGASTRODUODENOSCOPY (EGD) WITH PROPOFOL;  Surgeon: Jonathon Bellows, MD;  Location: [facility] ENDOSCOPY;  Service: Gastroenterology;  Laterality: N/A;   IR CT HEAD LTD  08/27/2019   IR PERCUTANEOUS ART THROMBECTOMY/INFUSION INTRACRANIAL INC DIAG ANGIO  08/27/2019       IR PERCUTANEOUS ART THROMBECTOMY/INFUSION INTRACRANIAL INC DIAG ANGIO  08/27/2019   LOWER EXTREMITY ANGIOGRAPHY Left 12/25/2020   Procedure: Lower Extremity Angiography;  Surgeon: Algernon Huxley, MD;  Location: [place] CV LAB;  Service: Cardiovascular;  Laterality: Left;   PACEMAKER LEADLESS INSERTION N/A 12/19/2020   Procedure: PACEMAKER LEADLESS INSERTION;  Surgeon: Isaias Cowman, MD;  Location: [place] CV LAB;  Service: Cardiovascular;  Laterality: N/A;    PROSTATE CANCER     PROSTATE SURGERY     RADIOLOGY WITH ANESTHESIA N/A 08/27/2019   Procedure: IR WITH ANESTHESIA;  Surgeon: Luanne Bras, MD;  Location: [place];  Service: Radiology;  Laterality: N/A;       Recent HPI and Hospital Course  Patient was eating an apple at work and got choked on it, fellow worker took him to ER for this.   Chief Complaint Airway Obstruction     HPI   [name] is a 89 y.o. male with past medical history of hypertension, diabetes, atrial fibrillation on Eliquis, CKD, CHF, AAA who presents to the ED complaining of difficulty swallowing.  Patient reports that he was eating an apple just prior to arrival when he felt to get stuck in the back of his throat.  His coworker briefly perform the Heimlich maneuver on him, but he denies any difficulty breathing and was speaking normally prior to receiving time with maneuver.  He reports a small chunk of apple came up afterwards, but he continues to  feel like something is stuck in the back of his throat.  He continues to deny difficulty breathing, states he has been able to swallow his spit without difficulty.  He denies any similar issues in the past.  [place] Issue to be followed in the Clinic   Hypertension Afib,  Diabetes Prostate cancer GERD    Subjective:   [name] today has, No headache, No chest pain, No abdominal pain - No Nausea, No new weakness tingling or numbness, No Cough - SOB. Feeling better   Assessment & Plan    1. Hospital discharge follow-up Discharged same day after upper endoscopy  2. Esophageal obstruction Treated with upper endoscopy at [facility]  3. Type 2 diabetes mellitus with hyperglycemia, without long-term current use of insulin (HCC) A1c elevated, discussed diet and lifestyle modification. If his A1c does not improve when checked in 4 months, will discuss adding a new medication  - POCT glycosylated hemoglobin (Hb A1C)  4.  Atherosclerosis of aorta (HCC) Continue atorvastatin as prescribed.     Reason for frequent admissions/ER visits    diabetes Afib Prostate cancer COPD gout   Objective:   Vitals:   03/29/22 0931  BP: 97/72  Pulse: 100  Resp: 16  Temp: (!) 97.5 F (36.4 C)  SpO2: 96%  Weight: 239 lb 6.4 oz (108.6 kg)  Height: 6' (1.829 m)    Wt Readings from Last 3 Encounters:  03/29/22 239 lb 6.4 oz (108.6 kg)  03/19/22 243 lb (110.2 kg)  03/18/22 240 lb (108.9 kg)    Allergies as of 03/29/2022   No Known Allergies      Medication List        Accurate as of March 29, 2022 10:12 AM. If you have any questions, ask your nurse or doctor.          albuterol 108 (90 Base) MCG/ACT inhaler Commonly known as: VENTOLIN HFA Inhale 2 puffs into the lungs every 4 (four) hours as needed for wheezing or shortness of breath.   atorvastatin 10 MG tablet Commonly known as: LIPITOR TAKE 1 TABLET(10 MG) BY MOUTH DAILY   benzonatate 100 MG capsule Commonly known as: Tessalon Perles Take 1 capsule (100 mg total) by mouth 3 (three) times daily as needed for cough.   colchicine 0.6 MG tablet Take one tab po bid for ankle pain   cyanocobalamin 1000 MCG tablet Commonly known as: VITAMIN B12 Take 1 tablet (1,000 mcg total) by mouth daily.   edoxaban 30 MG Tabs tablet Commonly known as: SAVAYSA Take by mouth.   Eliquis 5 MG Tabs tablet Generic drug: apixaban Take 5 mg by mouth 2 (two) times daily.   Farxiga 10 MG Tabs tablet Generic drug: dapagliflozin propanediol TAKE 1 TABLET(10 MG) BY MOUTH DAILY BEFORE BREAKFAST   ferrous sulfate 325 (65 FE) MG tablet Take 1 tablet (325 mg total) by mouth daily.   Metoprolol Tartrate 37.5 MG Tabs Take 37.5 mg by mouth 2 (two) times daily.   OneTouch Verio test strip Generic drug: glucose blood USE ONCE DAILY   pantoprazole 40 MG tablet Commonly known as: PROTONIX TAKE 1 TABLET(40 MG) BY MOUTH DAILY   Spiriva Respimat 2.5 MCG/ACT  Aers Generic drug: Tiotropium Bromide Monohydrate INHALE 2 PUFFS INTO THE LUNGS DAILY   tamsulosin 0.4 MG Caps capsule Commonly known as: FLOMAX Take 0.4 mg by mouth daily after breakfast.   Xtandi 40 MG tablet Generic drug: enzalutamide Take 4 tablets (160 mg total) by mouth daily.  Physical Exam: Constitutional: Patient appears well-developed and well-nourished. Not in obvious distress. HENT: Normocephalic, atraumatic, External right and left ear normal. Oropharynx is clear and moist.  Eyes: Conjunctivae and EOM are normal. PERRLA, no scleral icterus. Neck: Normal ROM. Neck supple. No JVD. No tracheal deviation. No thyromegaly. CVS: RRR, S1/S2 +, no murmurs, no gallops, no carotid bruit.  Pulmonary: Effort and breath sounds normal, no stridor, rhonchi, wheezes, rales.  Abdominal: Soft. BS +, no distension, tenderness, rebound or guarding.  Musculoskeletal: Normal range of motion. No edema and no tenderness.  Lymphadenopathy: No lymphadenopathy noted, cervical, inguinal or axillary Neuro: Alert. Normal reflexes, muscle tone coordination. No cranial nerve deficit. Skin: Skin is warm and dry. No rash noted. Not diaphoretic. No erythema. No pallor. Psychiatric: Normal mood and affect. Behavior, judgment, thought content normal.   Data Review   Micro Results No results found for this or any previous visit (from the past 240 hour(s)).   CBC No results for input(s): "WBC", "HGB", "HCT", "PLT", "MCV", "MCH", "MCHC", "RDW", "LYMPHSABS", "MONOABS", "EOSABS", "BASOSABS", "BANDABS" in the last 168 hours.  Invalid input(s): "NEUTRABS", "BANDSABD"  Chemistries  No results for input(s): "NA", "K", "CL", "CO2", "GLUCOSE", "BUN", "CREATININE", "CALCIUM", "MG", "AST", "ALT", "ALKPHOS", "BILITOT" in the last 168 hours.  Invalid input(s): "GFRCGP" ------------------------------------------------------------------------------------------------------------------ estimated  creatinine clearance is 28 mL/min (A) (by C-G formula based on SCr of 2.41 mg/dL (H)). ------------------------------------------------------------------------------------------------------------------ Recent Labs    03/29/22 0941  HGBA1C 8.2*   ------------------------------------------------------------------------------------------------------------------ No results for input(s): "CHOL", "HDL", "LDLCALC", "TRIG", "CHOLHDL", "LDLDIRECT" in the last 72 hours. ------------------------------------------------------------------------------------------------------------------ No results for input(s): "TSH", "T4TOTAL", "T3FREE", "THYROIDAB" in the last 72 hours.  Invalid input(s): "FREET3" ------------------------------------------------------------------------------------------------------------------ No results for input(s): "VITAMINB12", "FOLATE", "FERRITIN", "TIBC", "IRON", "RETICCTPCT" in the last 72 hours.  Coagulation profile No results for input(s): "INR", "PROTIME" in the last 168 hours.  No results for input(s): "DDIMER" in the last 72 hours.  Cardiac Enzymes No results for input(s): "CKMB", "TROPONINI", "MYOGLOBIN" in the last 168 hours.  Invalid input(s): "CK" ------------------------------------------------------------------------------------------------------------------ Invalid input(s): "POCBNP"  Return in about 4 months (around 07/29/2022) for F/U, Recheck A1C, Katelan Hirt PCP.   Time Spent in minutes  45 Time spent with patient included reviewing progress notes, labs, imaging studies, and discussing plan for follow up.   This patient was seen by Jonetta Osgood, FNP-C in collaboration with Dr. Clayborn Bigness as a part of collaborative care agreement.   Jonetta Osgood MSN, FNP-C on 03/29/2022 at 10:12 AM   **Disclaimer: This note may have been dictated with voice recognition software. Similar sounding words can inadvertently be transcribed and this note may contain  transcription errors which may not have been corrected upon publication of note.**

## 2022-04-09 ENCOUNTER — Other Ambulatory Visit: Payer: Self-pay

## 2022-04-09 DIAGNOSIS — N1832 Chronic kidney disease, stage 3b: Secondary | ICD-10-CM

## 2022-04-09 MED ORDER — DAPAGLIFLOZIN PROPANEDIOL 10 MG PO TABS: ORAL_TABLET | ORAL | 1 refills | Status: DC

## 2022-04-18 ENCOUNTER — Other Ambulatory Visit (HOSPITAL_COMMUNITY): Payer: Self-pay

## 2022-04-20 DIAGNOSIS — G4733 Obstructive sleep apnea (adult) (pediatric): Secondary | ICD-10-CM | POA: Diagnosis not present

## 2022-04-23 DIAGNOSIS — E1122 Type 2 diabetes mellitus with diabetic chronic kidney disease: Secondary | ICD-10-CM | POA: Diagnosis not present

## 2022-04-23 DIAGNOSIS — R8290 Unspecified abnormal findings in urine: Secondary | ICD-10-CM | POA: Diagnosis not present

## 2022-04-23 DIAGNOSIS — N184 Chronic kidney disease, stage 4 (severe): Secondary | ICD-10-CM | POA: Diagnosis not present

## 2022-04-23 DIAGNOSIS — R600 Localized edema: Secondary | ICD-10-CM | POA: Diagnosis not present

## 2022-04-24 ENCOUNTER — Other Ambulatory Visit (HOSPITAL_COMMUNITY): Payer: Self-pay

## 2022-04-29 DIAGNOSIS — N2581 Secondary hyperparathyroidism of renal origin: Secondary | ICD-10-CM | POA: Diagnosis not present

## 2022-04-29 DIAGNOSIS — N1832 Chronic kidney disease, stage 3b: Secondary | ICD-10-CM | POA: Diagnosis not present

## 2022-04-29 DIAGNOSIS — R600 Localized edema: Secondary | ICD-10-CM | POA: Diagnosis not present

## 2022-04-29 DIAGNOSIS — E1122 Type 2 diabetes mellitus with diabetic chronic kidney disease: Secondary | ICD-10-CM | POA: Diagnosis not present

## 2022-04-29 DIAGNOSIS — I10 Essential (primary) hypertension: Secondary | ICD-10-CM | POA: Diagnosis not present

## 2022-05-09 ENCOUNTER — Other Ambulatory Visit (INDEPENDENT_AMBULATORY_CARE_PROVIDER_SITE_OTHER): Payer: Self-pay | Admitting: Nurse Practitioner

## 2022-05-09 DIAGNOSIS — I739 Peripheral vascular disease, unspecified: Secondary | ICD-10-CM

## 2022-05-14 ENCOUNTER — Encounter (INDEPENDENT_AMBULATORY_CARE_PROVIDER_SITE_OTHER): Payer: Medicare Other

## 2022-05-14 ENCOUNTER — Ambulatory Visit (INDEPENDENT_AMBULATORY_CARE_PROVIDER_SITE_OTHER): Payer: Medicare Other | Admitting: Vascular Surgery

## 2022-05-14 ENCOUNTER — Telehealth (INDEPENDENT_AMBULATORY_CARE_PROVIDER_SITE_OTHER): Payer: Self-pay

## 2022-05-14 NOTE — Telephone Encounter (Signed)
Neyhart wife called this last yesterday to cancel his appts for today. She said he will call and reschedule soon.

## 2022-05-15 ENCOUNTER — Telehealth: Payer: Self-pay

## 2022-05-15 DIAGNOSIS — M1712 Unilateral primary osteoarthritis, left knee: Secondary | ICD-10-CM | POA: Diagnosis not present

## 2022-05-15 NOTE — Telephone Encounter (Signed)
Patient and his wife are coming to get a diabetic meal plan.

## 2022-05-18 ENCOUNTER — Other Ambulatory Visit: Payer: Self-pay | Admitting: Internal Medicine

## 2022-05-18 DIAGNOSIS — M25572 Pain in left ankle and joints of left foot: Secondary | ICD-10-CM

## 2022-05-20 ENCOUNTER — Other Ambulatory Visit: Payer: Self-pay

## 2022-05-21 DIAGNOSIS — G4733 Obstructive sleep apnea (adult) (pediatric): Secondary | ICD-10-CM | POA: Diagnosis not present

## 2022-05-23 DIAGNOSIS — H26493 Other secondary cataract, bilateral: Secondary | ICD-10-CM | POA: Diagnosis not present

## 2022-05-23 DIAGNOSIS — E113293 Type 2 diabetes mellitus with mild nonproliferative diabetic retinopathy without macular edema, bilateral: Secondary | ICD-10-CM | POA: Diagnosis not present

## 2022-05-23 DIAGNOSIS — H1045 Other chronic allergic conjunctivitis: Secondary | ICD-10-CM | POA: Diagnosis not present

## 2022-05-23 DIAGNOSIS — E119 Type 2 diabetes mellitus without complications: Secondary | ICD-10-CM | POA: Diagnosis not present

## 2022-05-23 DIAGNOSIS — H5213 Myopia, bilateral: Secondary | ICD-10-CM | POA: Diagnosis not present

## 2022-05-29 ENCOUNTER — Other Ambulatory Visit: Payer: Self-pay

## 2022-05-29 ENCOUNTER — Encounter: Payer: Self-pay | Admitting: Nurse Practitioner

## 2022-05-29 ENCOUNTER — Ambulatory Visit (INDEPENDENT_AMBULATORY_CARE_PROVIDER_SITE_OTHER): Payer: Medicare Other | Admitting: Nurse Practitioner

## 2022-05-29 VITALS — BP 120/86 | HR 93 | Temp 97.4°F | Resp 16 | Ht 72.0 in | Wt 230.0 lb

## 2022-05-29 DIAGNOSIS — Z794 Long term (current) use of insulin: Secondary | ICD-10-CM

## 2022-05-29 DIAGNOSIS — M25562 Pain in left knee: Secondary | ICD-10-CM | POA: Diagnosis not present

## 2022-05-29 DIAGNOSIS — I4891 Unspecified atrial fibrillation: Secondary | ICD-10-CM

## 2022-05-29 DIAGNOSIS — Z7409 Other reduced mobility: Secondary | ICD-10-CM | POA: Diagnosis not present

## 2022-05-29 DIAGNOSIS — I10 Essential (primary) hypertension: Secondary | ICD-10-CM

## 2022-05-29 DIAGNOSIS — G8929 Other chronic pain: Secondary | ICD-10-CM

## 2022-05-29 DIAGNOSIS — N1832 Chronic kidney disease, stage 3b: Secondary | ICD-10-CM | POA: Diagnosis not present

## 2022-05-29 DIAGNOSIS — E1122 Type 2 diabetes mellitus with diabetic chronic kidney disease: Secondary | ICD-10-CM | POA: Diagnosis not present

## 2022-05-29 LAB — POCT GLYCOSYLATED HEMOGLOBIN (HGB A1C): Hemoglobin A1C: 7.6 % — AB (ref 4.0–5.6)

## 2022-05-29 NOTE — Progress Notes (Signed)
[facility] [address] [place], [place] [address]  Internal MEDICINE  Office Visit Note  Patient Name: [name]  [id number]  [id number]  Date of Service: 05/29/2022  Chief Complaint  Patient presents with   Diabetes   Hypertension   Follow-up    HPI [name] presents for a follow-up visit for diabetes and hypertension and left knee pain.  Diabetes -- A1c improved to 7.6 today from 8.2 in march Lost 13 lbs since February. Further weight loss will alleviate some of the pressure on his left knee joint.  Hypertension -- blood pressure is controlled by current medications  Has chronic pain of his left knee which is making it hard to walk especially up and down stairs. He is waiting to have surgery for a knee replacement but the surgeon wants his A1c to be better before doing the surgery. Prior to having any surgery, he will still need to have an intraarticular injection of cortisone first and the surgeon may want to try other nonsurgical interventions first.  AFIB and history of DVT -- patient is on edoxaban with a history of AFIB and PE.     Current Medication: Outpatient Encounter Medications as of 05/29/2022  Medication Sig   albuterol (VENTOLIN HFA) 108 (90 Base) MCG/ACT inhaler Inhale 2 puffs into the lungs every 4 (four) hours as needed for wheezing or shortness of breath.   atorvastatin (LIPITOR) 10 MG tablet TAKE 1 TABLET(10 MG) BY MOUTH DAILY   benzonatate (TESSALON PERLES) 100 MG capsule Take 1 capsule (100 mg total) by mouth 3 (three) times daily as needed for cough.   colchicine 0.6 MG tablet TAKE 1 TABLET BY MOUTH TWICE DAILY FOR ANKLE PAIN   dapagliflozin propanediol (FARXIGA) 10 MG TABS tablet TAKE 1 TABLET(10 MG) BY MOUTH DAILY BEFORE BREAKFAST   edoxaban (SAVAYSA) 30 MG TABS tablet Take by mouth.   ELIQUIS 5 MG TABS tablet Take 5 mg by mouth 2 (two) times daily.   enzalutamide (XTANDI) 40 MG tablet Take 4 tablets (160 mg total) by mouth daily.    ferrous sulfate 325 (65 FE) MG tablet Take 1 tablet (325 mg total) by mouth daily.   glucose blood (ONETOUCH VERIO) test strip USE ONCE DAILY   metoprolol tartrate 37.5 MG TABS Take 37.5 mg by mouth 2 (two) times daily.   pantoprazole (PROTONIX) 40 MG tablet TAKE 1 TABLET(40 MG) BY MOUTH DAILY   SPIRIVA RESPIMAT 2.5 MCG/ACT AERS INHALE 2 PUFFS INTO THE LUNGS DAILY   tamsulosin (FLOMAX) 0.4 MG CAPS capsule Take 0.4 mg by mouth daily after breakfast.   vitamin B-12 (CYANOCOBALAMIN) 1000 MCG tablet Take 1 tablet (1,000 mcg total) by mouth daily.   No facility-administered encounter medications on file as of 05/29/2022.    Surgical History: Past Surgical History:  Procedure Laterality Date   CATARACT EXTRACTION     COLONOSCOPY WITH PROPOFOL N/A 07/02/2019   Procedure: COLONOSCOPY WITH PROPOFOL;  Surgeon: Toney Reil, MD;  Location[facility] ENDOSCOPY;  Service: Gastroenterology;  Laterality: N/A;   ESOPHAGOGASTRODUODENOSCOPY (EGD) WITH PROPOFOL N/A 03/19/2022   Procedure: ESOPHAGOGASTRODUODENOSCOPY (EGD) WITH PROPOFOL;  Surgeon: Wyline Mood, MD;  Location: [facility] ENDOSCOPY;  Service: Gastroenterology;  Laterality: N/A;   IR CT HEAD LTD  08/27/2019   IR PERCUTANEOUS ART THROMBECTOMY/INFUSION INTRACRANIAL INC DIAG ANGIO  08/27/2019       IR PERCUTANEOUS ART THROMBECTOMY/INFUSION INTRACRANIAL INC DIAG ANGIO  08/27/2019   LOWER EXTREMITY ANGIOGRAPHY Left 12/25/2020   Procedure: Lower Extremity Angiography;  Surgeon:  Annice Needy, MD;  Location: [facility] INVASIVE CV LAB;  Service: Cardiovascular;  Laterality: Left;   PACEMAKER LEADLESS INSERTION N/A 12/19/2020   Procedure: PACEMAKER LEADLESS INSERTION;  Surgeon: Marcina Millard, MD;  Location: [facility] INVASIVE CV LAB;  Service: Cardiovascular;  Laterality: N/A;   PROSTATE CANCER     PROSTATE SURGERY     RADIOLOGY WITH ANESTHESIA N/A 08/27/2019   Procedure: IR WITH ANESTHESIA;  Surgeon: Julieanne Cotton, MD;  Location: [facility] OR;  Service: Radiology;   Laterality: N/A;    Medical History: Past Medical History:  Diagnosis Date   Atrial fibrillation (HCC)    CHF (congestive heart failure) (HCC)    Diabetes (HCC)    Hearing loss    High blood pressure    History of bladder problems    Kidney stones 11/22/2014   Prostate cancer (HCC)    Prostate cancer (HCC)    Stroke (HCC)     Family History: Family History  Problem Relation Age of Onset   Colon cancer Mother    Diabetes Other    High blood pressure Other    Prostate cancer Brother    Diabetes Brother     Social History   Socioeconomic History   Marital status: Married    Spouse name: [name]   Number of children: 2   Years of education: 12   Highest education level: 12th grade  Occupational History   Occupation: [employer]  Tobacco Use   Smoking status: Former    Years: 16    Types: Cigarettes   Smokeless tobacco: Never  Vaping Use   Vaping Use: Never used  Substance and Sexual Activity   Alcohol use: No   Drug use: No   Sexual activity: Not Currently  Other Topics Concern   Not on file  Social History Narrative   Not on file   Social Determinants of Health   Financial Resource Strain: Low Risk  (10/29/2019)   Overall Financial Resource Strain (CARDIA)    Difficulty of Paying Living Expenses: Not very hard  Recent Concern: Financial Resource Strain - Medium Risk (09/08/2019)   Overall Financial Resource Strain (CARDIA)    Difficulty of Paying Living Expenses: Somewhat hard  Food Insecurity: No Food Insecurity (10/04/2021)   Hunger Vital Sign    Worried About Running Out of Food in the Last Year: Never true    Ran Out of Food in the Last Year: Never true  Transportation Needs: No Transportation Needs (10/04/2021)   PRAPARE - Administrator, Civil Service (Medical): No    Lack of Transportation (Non-Medical): No  Physical Activity: Inactive (10/29/2019)   Exercise Vital Sign    Days of Exercise per Week: 0 days    Minutes of Exercise  per Session: 0 min  Stress: No Stress Concern Present (10/29/2019)   [employer] of Occupational Health - Occupational Stress Questionnaire    Feeling of Stress : Only a little  Recent Concern: Stress - Stress Concern Present (09/08/2019)   [employer] of Occupational Health - Occupational Stress Questionnaire    Feeling of Stress : To some extent  Social Connections: Moderately Integrated (10/29/2019)   Social Connection and Isolation Panel [NHANES]    Frequency of Communication with Friends and Family: More than three times a week    Frequency of Social Gatherings with Friends and Family: More than three times a week    Attends Religious Services: More than 4 times per year    Active Member of [employer]  or Organizations: No    Attends Banker Meetings: Never    Marital Status: Married  Catering manager Violence: Not At Risk (10/04/2021)   Humiliation, Afraid, Rape, and Kick questionnaire    Fear of Current or Ex-Partner: No    Emotionally Abused: No    Physically Abused: No    Sexually Abused: No      Review of Systems  Constitutional:  Negative for chills, fatigue and unexpected weight change.  HENT:  Negative for congestion, postnasal drip, rhinorrhea, sneezing and sore throat.   Eyes:  Negative for redness.  Respiratory: Negative.  Negative for cough, chest tightness, shortness of breath and wheezing.   Cardiovascular: Negative.  Negative for chest pain and palpitations.  Gastrointestinal:  Negative for abdominal pain, constipation, diarrhea, nausea and vomiting.  Musculoskeletal:  Positive for arthralgias, gait problem and joint swelling. Negative for back pain and neck pain.  Skin:  Negative for rash.  Neurological:  Negative for tremors and numbness.  Hematological:  Negative for adenopathy. Bruises/bleeds easily (taking edoxaban).  Psychiatric/Behavioral:  Negative for behavioral problems (Depression), sleep disturbance and suicidal ideas. The patient is  not nervous/anxious.     Vital Signs: BP 120/86   Pulse 93   Temp (!) 97.4 F (36.3 C)   Resp 16   Ht 6' (1.829 m)   Wt 230 lb (104.3 kg)   SpO2 93%   BMI 31.19 kg/m    Physical Exam Vitals reviewed.  Constitutional:      General: He is not in acute distress.    Appearance: Normal appearance. He is obese. He is not ill-appearing.  HENT:     Head: Normocephalic and atraumatic.  Eyes:     Pupils: Pupils are equal, round, and reactive to light.  Cardiovascular:     Rate and Rhythm: Normal rate and regular rhythm.  Pulmonary:     Effort: Pulmonary effort is normal. No respiratory distress.  Neurological:     Mental Status: He is alert and oriented to person, place, and time.        Assessment/Plan: 1. Type 2 diabetes mellitus with stage 3b chronic kidney disease, with long-term current use of insulin (HCC) A1c is improving, continue medications as prescribed.  - POCT glycosylated hemoglobin (Hb A1C)  2. Atrial fibrillation with RVR (HCC) Continue metoprolol for rate control and edoxaban to prevent blood clots.   3. Essential hypertension Stable, continue metoprolol as prescribed.   4. Chronic pain of left knee Seeing orthopedic surgeon, wants to have knee replacement surgery   5. Decreased ambulation status Due to knee pain, seeing orthopedic surgery    General Counseling: paysen turnquist understanding of the findings of todays visit and agrees with plan of treatment. I have discussed any further diagnostic evaluation that may be needed or ordered today. We also reviewed his medications today. he has been encouraged to call the office with any questions or concerns that should arise related to todays visit.    Orders Placed This Encounter  Procedures   POCT glycosylated hemoglobin (Hb A1C)    No orders of the defined types were placed in this encounter.   Return for F/U, Tumeka Chimenti PCP, previously scheduled in july. .   Total time spent:30 Minutes Time  spent includes review of chart, medications, test results, and follow up plan with the patient.   [place] Controlled Substance Database was reviewed by me.  This patient was seen by [name], FNP-C in collaboration with Dr. Beverely Risen as a part  of collaborative care agreement.   Khyrin Trevathan R. Tedd Sias, MSN, FNP-C Internal medicine

## 2022-06-04 ENCOUNTER — Ambulatory Visit: Payer: Medicare Other | Admitting: Nurse Practitioner

## 2022-06-04 DIAGNOSIS — I5032 Chronic diastolic (congestive) heart failure: Secondary | ICD-10-CM | POA: Diagnosis not present

## 2022-06-05 DIAGNOSIS — M1712 Unilateral primary osteoarthritis, left knee: Secondary | ICD-10-CM | POA: Diagnosis not present

## 2022-06-09 ENCOUNTER — Encounter: Payer: Self-pay | Admitting: Nurse Practitioner

## 2022-06-19 ENCOUNTER — Other Ambulatory Visit: Payer: Self-pay | Admitting: Internal Medicine

## 2022-06-19 DIAGNOSIS — J449 Chronic obstructive pulmonary disease, unspecified: Secondary | ICD-10-CM

## 2022-06-20 ENCOUNTER — Other Ambulatory Visit: Payer: Self-pay | Admitting: Oncology

## 2022-06-20 ENCOUNTER — Other Ambulatory Visit (HOSPITAL_COMMUNITY): Payer: Self-pay

## 2022-06-20 DIAGNOSIS — G4733 Obstructive sleep apnea (adult) (pediatric): Secondary | ICD-10-CM | POA: Diagnosis not present

## 2022-06-20 DIAGNOSIS — C61 Malignant neoplasm of prostate: Secondary | ICD-10-CM

## 2022-06-20 MED ORDER — ENZALUTAMIDE 40 MG PO TABS
160.0000 mg | ORAL_TABLET | Freq: Every day | ORAL | 2 refills | Status: DC
  Filled 2022-06-20: qty 120, 30d supply, fill #0
  Filled 2022-07-17: qty 120, 30d supply, fill #1

## 2022-06-21 ENCOUNTER — Other Ambulatory Visit: Payer: Self-pay

## 2022-06-21 ENCOUNTER — Other Ambulatory Visit (HOSPITAL_COMMUNITY): Payer: Self-pay

## 2022-06-25 ENCOUNTER — Other Ambulatory Visit (HOSPITAL_COMMUNITY): Payer: Self-pay

## 2022-06-27 ENCOUNTER — Other Ambulatory Visit: Payer: Self-pay

## 2022-06-27 ENCOUNTER — Emergency Department
Admission: EM | Admit: 2022-06-27 | Discharge: 2022-06-27 | Disposition: A | Discharge status: Home / Self Care | Payer: Medicare Other | Attending: Emergency Medicine | Admitting: Emergency Medicine

## 2022-06-27 ENCOUNTER — Encounter: Payer: Self-pay | Admitting: Emergency Medicine

## 2022-06-27 ENCOUNTER — Emergency Department: Payer: Medicare Other

## 2022-06-27 DIAGNOSIS — I110 Hypertensive heart disease with heart failure: Secondary | ICD-10-CM | POA: Diagnosis not present

## 2022-06-27 DIAGNOSIS — I509 Heart failure, unspecified: Secondary | ICD-10-CM | POA: Diagnosis not present

## 2022-06-27 DIAGNOSIS — R079 Chest pain, unspecified: Secondary | ICD-10-CM

## 2022-06-27 DIAGNOSIS — E119 Type 2 diabetes mellitus without complications: Secondary | ICD-10-CM | POA: Diagnosis not present

## 2022-06-27 DIAGNOSIS — R0789 Other chest pain: Secondary | ICD-10-CM | POA: Diagnosis not present

## 2022-06-27 DIAGNOSIS — K219 Gastro-esophageal reflux disease without esophagitis: Secondary | ICD-10-CM | POA: Diagnosis not present

## 2022-06-27 LAB — CBC
HCT: 48.3 % (ref 39.0–52.0)
Hemoglobin: 15.4 g/dL (ref 13.0–17.0)
MCH: 30.6 pg (ref 26.0–34.0)
MCHC: 31.9 g/dL (ref 30.0–36.0)
MCV: 96 fL (ref 80.0–100.0)
Platelets: 184 10*3/uL (ref 150–400)
RBC: 5.03 MIL/uL (ref 4.22–5.81)
RDW: 15.3 % (ref 11.5–15.5)
WBC: 4.8 10*3/uL (ref 4.0–10.5)
nRBC: 0 % (ref 0.0–0.2)

## 2022-06-27 LAB — BASIC METABOLIC PANEL
Anion gap: 9 (ref 5–15)
BUN: 38 mg/dL — ABNORMAL HIGH (ref 8–23)
CO2: 25 mmol/L (ref 22–32)
Calcium: 9.1 mg/dL (ref 8.9–10.3)
Chloride: 104 mmol/L (ref 98–111)
Creatinine, Ser: 2.07 mg/dL — ABNORMAL HIGH (ref 0.61–1.24)
GFR, Estimated: 30 mL/min — ABNORMAL LOW (ref >60)
Glucose, Bld: 127 mg/dL — ABNORMAL HIGH (ref 70–99)
Potassium: 3.9 mmol/L (ref 3.5–5.1)
Sodium: 138 mmol/L (ref 135–145)

## 2022-06-27 LAB — TROPONIN I (HIGH SENSITIVITY): Troponin I (High Sensitivity): 17 ng/L (ref <18)

## 2022-06-27 MED ORDER — PANTOPRAZOLE SODIUM 40 MG PO TBEC: 40.0000 mg | DELAYED_RELEASE_TABLET | Freq: Every day | ORAL | 1 refills | Status: DC

## 2022-06-27 MED ORDER — SUCRALFATE 1 G PO TABS: 1.0000 g | ORAL_TABLET | Freq: Four times a day (QID) | ORAL | 0 refills | Status: DC

## 2022-06-27 NOTE — ED Notes (Signed)
Pt verbalizes understanding of discharge instructions. Opportunity for questioning and answers were provided. Pt discharged from ED to home with wife.    

## 2022-06-27 NOTE — Discharge Instructions (Addendum)
As we discussed please call the provided for GI medicine to arrange a follow-up appointment.  Take your medications as prescribed.  Return to the emergency department for any worsening pain or any other symptom personally concerning to yourself.

## 2022-06-27 NOTE — ED Triage Notes (Signed)
Pt complains of chest pain and weakness  x 2 days, denies SOB, chest pain worse with movement. Denies any n/v/d

## 2022-06-27 NOTE — ED Notes (Signed)
Pt unable to give a urine sample at this time. States that he has been having chest pain for two days that worsens with palpation and when he bends over and occasionally after eating. Pt denies any weakness. Per MD hold off on drawing second trop at this time.

## 2022-06-27 NOTE — ED Provider Notes (Signed)
[facility] Provider Note    Event Date/Time   First MD Initiated Contact with Patient 06/27/22 1625     (approximate)  History   Chief Complaint: Chest Pain  HPI  [name] is a 89 y.o. male with a past medical history of atrial fibrillation, CHF, diabetes, hypertension, presents to the emergency department for intermittent chest pain.  According to the patient for quite some time he has been experiencing intermittent chest pain he describes as months or more.  Patient states over the last 2 to 3 days it has been worse he mostly notices it if he has to bend over or when he eats/swallows food.  Denies any shortness of breath denies any chest pain or shortness of breath with exertion.  No nausea vomiting or diarrhea no fever or recent illness.  Denies any symptoms currently.  Physical Exam   Triage Vital Signs: ED Triage Vitals  Enc Vitals Group     BP 06/27/22 1418 127/83     Pulse Rate 06/27/22 1414 67     Resp 06/27/22 1414 18     Temp 06/27/22 1414 97.8 F (36.6 C)     Temp Source 06/27/22 1414 Oral     SpO2 06/27/22 1414 98 %     Weight 06/27/22 1416 229 lb 15 oz (104.3 kg)     Height --      Head Circumference --      Peak Flow --      Pain Score 06/27/22 1415 6     Pain Loc --      Pain Edu? --      Excl. in GC? --     Most recent vital signs: Vitals:   06/27/22 1645 06/27/22 1700  BP: (!) 144/93 (!) 149/95  Pulse: 69 72  Resp: 18 18  Temp:    SpO2: 93% 95%    General: Awake, no distress.  CV:  Good peripheral perfusion.  Regular rate and rhythm  Resp:  Normal effort.  Equal breath sounds bilaterally.  Abd:  No distention.  Soft, nontender.  No rebound or guarding.  ED Results / Procedures / Treatments   EKG  EKG viewed and interpreted by myself shows a ventricular paced rhythm at 74 bpm with a widened QRS, left axis deviation, largely normal intervals with nonspecific ST changes occasional PVC.  RADIOLOGY  I have  reviewed and interpreted the chest x-ray images.  No obvious consolidation seen on my evaluation. Radiology has read the x-ray is minimal atelectasis.   MEDICATIONS ORDERED IN ED: Medications - No data to display   IMPRESSION / MDM / ASSESSMENT AND PLAN / ED COURSE  I reviewed the triage vital signs and the nursing notes.  Patient's presentation is most consistent with acute presentation with potential threat to life or bodily function.  Patient presents emergency department for intermittent chest pain he states has been ongoing for months but worse over the last several days.  States he mostly notices it when he bends forward or if he eats.  Denies any pain in the abdomen.  He has a completely benign abdomen on exam.  Patient's workup in the emergency department is overall reassuring reassuring chest x-ray reassuring EKG, chronic kidney disease but otherwise reassuring chemistry, reassuring CBC and a reassuringly negative troponin.  Given the patient's reassuring workup I believe the patient is safe for discharge home.  In discussing the patient's symptoms further he states at times he notices it when he  eats, describes more of a reflux type sensation.  He does not appear to be on any antacids or acid blocking medications.  Discussed with the patient believe it would be beneficial for him to follow-up with GI medicine to discuss an endoscopy.  Will place the patient on Protonix and sucralfate.  Discussed my typical chest pain return precautions.  Patient agreeable to plan of care.  FINAL CLINICAL IMPRESSION(S) / ED DIAGNOSES   Chest pain Reflux   Note:  This document was prepared using Dragon voice recognition software and may include unintentional dictation errors.   Minna Antis, MD 06/27/22 1731

## 2022-06-28 ENCOUNTER — Telehealth: Payer: Self-pay | Admitting: Nurse Practitioner

## 2022-06-30 ENCOUNTER — Other Ambulatory Visit: Payer: Self-pay

## 2022-06-30 ENCOUNTER — Emergency Department
Admission: EM | Admit: 2022-06-30 | Discharge: 2022-07-01 | Disposition: A | Discharge status: Home / Self Care | Payer: Medicare Other | Attending: Emergency Medicine | Admitting: Emergency Medicine

## 2022-06-30 ENCOUNTER — Emergency Department: Payer: Medicare Other

## 2022-06-30 DIAGNOSIS — Z8673 Personal history of transient ischemic attack (TIA), and cerebral infarction without residual deficits: Secondary | ICD-10-CM | POA: Insufficient documentation

## 2022-06-30 DIAGNOSIS — Z950 Presence of cardiac pacemaker: Secondary | ICD-10-CM | POA: Diagnosis not present

## 2022-06-30 DIAGNOSIS — I499 Cardiac arrhythmia, unspecified: Secondary | ICD-10-CM | POA: Diagnosis not present

## 2022-06-30 DIAGNOSIS — Z8546 Personal history of malignant neoplasm of prostate: Secondary | ICD-10-CM | POA: Diagnosis not present

## 2022-06-30 DIAGNOSIS — I110 Hypertensive heart disease with heart failure: Secondary | ICD-10-CM | POA: Diagnosis not present

## 2022-06-30 DIAGNOSIS — R079 Chest pain, unspecified: Secondary | ICD-10-CM

## 2022-06-30 DIAGNOSIS — R0902 Hypoxemia: Secondary | ICD-10-CM | POA: Diagnosis not present

## 2022-06-30 DIAGNOSIS — I509 Heart failure, unspecified: Secondary | ICD-10-CM | POA: Diagnosis not present

## 2022-06-30 DIAGNOSIS — J9811 Atelectasis: Secondary | ICD-10-CM | POA: Diagnosis not present

## 2022-06-30 DIAGNOSIS — R6889 Other general symptoms and signs: Secondary | ICD-10-CM | POA: Diagnosis not present

## 2022-06-30 LAB — HEPATIC FUNCTION PANEL
ALT: 13 U/L (ref 0–44)
AST: 20 U/L (ref 15–41)
Albumin: 3.6 g/dL (ref 3.5–5.0)
Alkaline Phosphatase: 136 U/L — ABNORMAL HIGH (ref 38–126)
Bilirubin, Direct: 0.2 mg/dL (ref 0.0–0.2)
Indirect Bilirubin: 0.5 mg/dL (ref 0.3–0.9)
Total Bilirubin: 0.7 mg/dL (ref 0.3–1.2)
Total Protein: 7.2 g/dL (ref 6.5–8.1)

## 2022-06-30 LAB — BASIC METABOLIC PANEL
Anion gap: 9 (ref 5–15)
BUN: 32 mg/dL — ABNORMAL HIGH (ref 8–23)
CO2: 25 mmol/L (ref 22–32)
Calcium: 9.1 mg/dL (ref 8.9–10.3)
Chloride: 102 mmol/L (ref 98–111)
Creatinine, Ser: 1.89 mg/dL — ABNORMAL HIGH (ref 0.61–1.24)
GFR, Estimated: 34 mL/min — ABNORMAL LOW (ref >60)
Glucose, Bld: 140 mg/dL — ABNORMAL HIGH (ref 70–99)
Potassium: 4.2 mmol/L (ref 3.5–5.1)
Sodium: 136 mmol/L (ref 135–145)

## 2022-06-30 LAB — TROPONIN I (HIGH SENSITIVITY)
Troponin I (High Sensitivity): 17 ng/L (ref <18)
Troponin I (High Sensitivity): 20 ng/L — ABNORMAL HIGH (ref <18)

## 2022-06-30 LAB — CBC
HCT: 49.9 % (ref 39.0–52.0)
Hemoglobin: 15.7 g/dL (ref 13.0–17.0)
MCH: 30.2 pg (ref 26.0–34.0)
MCHC: 31.5 g/dL (ref 30.0–36.0)
MCV: 96 fL (ref 80.0–100.0)
Platelets: 179 10*3/uL (ref 150–400)
RBC: 5.2 MIL/uL (ref 4.22–5.81)
RDW: 15.3 % (ref 11.5–15.5)
WBC: 5.9 10*3/uL (ref 4.0–10.5)
nRBC: 0 % (ref 0.0–0.2)

## 2022-06-30 LAB — LIPASE, BLOOD: Lipase: 32 U/L (ref 11–51)

## 2022-06-30 MED ORDER — LIDOCAINE VISCOUS HCL 2 % MT SOLN
15.0000 mL | Freq: Once | OROMUCOSAL | Status: AC
  Administered 2022-06-30: 15 mL via ORAL
  Filled 2022-06-30: qty 15

## 2022-06-30 MED ORDER — FAMOTIDINE 20 MG PO TABS
20.0000 mg | ORAL_TABLET | Freq: Once | ORAL | Status: AC
  Administered 2022-06-30: 20 mg via ORAL
  Filled 2022-06-30: qty 1

## 2022-06-30 MED ORDER — ALUM & MAG HYDROXIDE-SIMETH 200-200-20 MG/5ML PO SUSP
30.0000 mL | Freq: Once | ORAL | Status: AC
  Administered 2022-06-30: 30 mL via ORAL
  Filled 2022-06-30: qty 30

## 2022-06-30 NOTE — ED Provider Notes (Signed)
Care assumed of patient from outgoing provider.  See their note for initial history, exam and plan.  Clinical Course as of 06/30/22 2352  [name] Jun 30, 2022  2343 Chest pain, atypical - repeat trop pending [SM]    Clinical Course User Index [SM] Corena Herter, MD   Troponin 17, 20, no significant delta change, have low suspicion for ACS.  Patient was given follow-up and referral to cardiology.  Given return precautions.   Corena Herter, MD 06/30/22 2352

## 2022-06-30 NOTE — ED Triage Notes (Signed)
Patient arrives with [facility] c/o chest pain. Patient was evaluated Thursday for the same. Patient states the chest pain returned and is 8/10. Patient was in afib with HR 60-110. Patient was given 1 SL nitro; patient did not take aspirin and states "his doctor told him not to take it." Patient has hx of CHF, is on eliquis, and has pacemaker.  EMS vitals:  BP 162/94 81 HR  177 CBG 96 O2 on RA

## 2022-06-30 NOTE — Discharge Instructions (Addendum)
Continue taking all medications as prescribed by your doctors.  I made a referral to the heart doctor and gastroenterology doctor to further assess your symptoms.  Please follow-up with your regular doctor this week for recheck as well.    Please understand that if you experience any new, worsening, unexpected symptoms call your doctor right away or return to the emergency department.

## 2022-06-30 NOTE — ED Provider Notes (Addendum)
[facility] Provider Note    Event Date/Time   First MD Initiated Contact with Patient 06/30/22 2034     (approximate)   History   Chest Pain   HPI  [name] is a 89 y.o. male   Past medical history of AF w pacer on ac, CHF, stroke, prostate ca, htn here with chest pain.  Intermittent ongoing for the last several months and was evaluated 3 days ago in the emergency department for the same.  Same quality location and severity.  Substernal nonradiating.  Not associated with any respiratory complaints like shortness of breath, cough, and he denies fever or chills.  No nausea vomiting or diarrhea.  No other acute medical complaints.    External Medical Documents Reviewed: Emergency department visit dated earlier this week with similar symptoms as above, at that time noted to be worse after eating, with a negative workup thought to be due to dyspepsia      Physical Exam   Triage Vital Signs: ED Triage Vitals  Enc Vitals Group     BP      Pulse      Resp      Temp      Temp src      SpO2      Weight      Height      Head Circumference      Peak Flow      Pain Score      Pain Loc      Pain Edu?      Excl. in GC?     Most recent vital signs: Vitals:   06/30/22 2130 06/30/22 2230  BP: 120/79 (!) 144/90  Pulse: 70 68  Resp: 18 17  Temp:    SpO2: 97% 98%    General: Awake, no distress.  CV:  Good peripheral perfusion.  Resp:  Normal effort.  Abd:  No distention.  Other:  Awake alert comfortable with normal hemodynamics aside from slight hypertension, no hypoxemia no respiratory distress.  Clear lungs soft and nontender abdomen to deep palpation in all quadrants.  Pedal pulses are intact and equal bilaterally.  No peripheral edema.   ED Results / Procedures / Treatments   Labs (all labs ordered are listed, but only abnormal results are displayed) Labs Reviewed  BASIC METABOLIC PANEL - Abnormal; Notable for the following  components:      Result Value   Glucose, Bld 140 (*)    BUN 32 (*)    Creatinine, Ser 1.89 (*)    GFR, Estimated 34 (*)    All other components within normal limits  HEPATIC FUNCTION PANEL - Abnormal; Notable for the following components:   Alkaline Phosphatase 136 (*)    All other components within normal limits  CBC  LIPASE, BLOOD  TROPONIN I (HIGH SENSITIVITY)  TROPONIN I (HIGH SENSITIVITY)     I ordered and reviewed the above labs they are notable for normal cell counts and H&H, normal white blood cell count  EKG  ED ECG REPORT I, Pilar Jarvis, the attending physician, personally viewed and interpreted this ECG.   Date: 06/30/2022  EKG Time: 2035  Rate: 76  Rhythm: sinus  Axis: nl  Intervals: Nonspecific intraventricular conduction, occasional PVC   ST&T Change: no stemi    RADIOLOGY I independently reviewed and interpreted CXR and see no obvious focality or pneumothorax   PROCEDURES:  Critical Care performed: No  Procedures   MEDICATIONS ORDERED IN  ED: Medications  alum & mag hydroxide-simeth (MAALOX/MYLANTA) 200-200-20 MG/5ML suspension 30 mL (30 mLs Oral Given 06/30/22 2130)    And  lidocaine (XYLOCAINE) 2 % viscous mouth solution 15 mL (15 mLs Oral Given 06/30/22 2130)  famotidine (PEPCID) tablet 20 mg (20 mg Oral Given 06/30/22 2130)      IMPRESSION / MDM / ASSESSMENT AND PLAN / ED COURSE  I reviewed the triage vital signs and the nursing notes.                                Patient's presentation is most consistent with acute presentation with potential threat to life or bodily function.  Differential diagnosis includes, but is not limited to, ACS, PE, pneumothorax, dissection, msk, gerd/dyspepsia  The patient is on the cardiac monitor to evaluate for evidence of arrhythmia and/or significant heart rate changes.  MDM: This is a patient with very atypical/nonspecific intermittent chest pain that is nonexertional nonradiating without accompanying  symptoms suggestive of PE, dissection, ACS, or respiratory infection.  The pain comes and goes and he has had intermittent spells of pain even here while in the emergency department though he states that the pain is most pronounced after eating or with certain positions.  Evaluated just 3 days ago, w neg work up -- may be due to GERD, but should be assessed in this patient with multiple cardiac risk factors for ACS will get EKG and serial troponin  Symptomatology and qualities would be very consistent dissection.  Anticoagulated so doubt PE.  No respiratory infectious symptoms to suggest respiratory infection, afebrile, normal white blood cell count.  -- Initial troponin is unchanged from testing 3 days ago. Plan for rpt troponin and if remains unchanged, patient remained stable, and will be discharged with cardiology follow-up as well as GI referral. I spoke with patient and wife about this plan and they are in agreement.      FINAL CLINICAL IMPRESSION(S) / ED DIAGNOSES   Final diagnoses:  Nonspecific chest pain     Rx / DC Orders   ED Discharge Orders          Ordered    Ambulatory referral to Cardiology        06/30/22 2250    Ambulatory referral to Gastroenterology        06/30/22 2250             Note:  This document was prepared using Dragon voice recognition software and may include unintentional dictation errors.    Pilar Jarvis, MD 06/30/22 2252    Pilar Jarvis, MD 06/30/22 [phone]

## 2022-07-01 ENCOUNTER — Ambulatory Visit (INDEPENDENT_AMBULATORY_CARE_PROVIDER_SITE_OTHER): Payer: Medicare Other | Admitting: Physician Assistant

## 2022-07-01 ENCOUNTER — Encounter: Payer: Self-pay | Admitting: Physician Assistant

## 2022-07-01 VITALS — BP 100/68 | HR 89 | Temp 97.1°F | Resp 16 | Ht 72.0 in | Wt 237.2 lb

## 2022-07-01 DIAGNOSIS — I10 Essential (primary) hypertension: Secondary | ICD-10-CM

## 2022-07-01 DIAGNOSIS — I4891 Unspecified atrial fibrillation: Secondary | ICD-10-CM | POA: Diagnosis not present

## 2022-07-01 DIAGNOSIS — R079 Chest pain, unspecified: Secondary | ICD-10-CM | POA: Diagnosis not present

## 2022-07-01 NOTE — Progress Notes (Signed)
[facility] [address] [place], [place] [address]  Internal MEDICINE  Office Visit Note  Patient Name: [name]  [id number]  [id number]  Date of Service: 07/16/2022  Chief Complaint  Patient presents with   Diabetes   Hypertension   Follow-up    ED f/u chest pains    HPI Pt is here for ED follow up -Went to ED on 06/27/22 and then again on 06/30/22 for CP -ON 06/27/22 visit he went to ED with intermittent CP that he states had bene occurring for months but had gotten worse over the last 2-3 days when he bent over or swallowed food. Given reassuring work up in ED it was believed to be due to reflux and was put on protonix and sucralfate and have follow up with GI for possible endoscopy and was discharged. He returned to ED on 06/30/22 for the same symptoms and repeat troponins and EKG obtained. He was again advised on GI follow up, but a cardiology follow up was also recommended. -BP and HR normal at home, no CP in office today. BP lower in office and will hydrate and monitor -He is still taking the reflux meds -Used to see Dr. Gwen Pounds before he retired and will be establishing with new cardiologist  Current Medication: Outpatient Encounter Medications as of 07/01/2022  Medication Sig   albuterol (VENTOLIN HFA) 108 (90 Base) MCG/ACT inhaler Inhale 2 puffs into the lungs every 4 (four) hours as needed for wheezing or shortness of breath.   atorvastatin (LIPITOR) 10 MG tablet TAKE 1 TABLET(10 MG) BY MOUTH DAILY   benzonatate (TESSALON PERLES) 100 MG capsule Take 1 capsule (100 mg total) by mouth 3 (three) times daily as needed for cough.   colchicine 0.6 MG tablet TAKE 1 TABLET BY MOUTH TWICE DAILY FOR ANKLE PAIN (Patient not taking: Reported on 07/03/2022)   dapagliflozin propanediol (FARXIGA) 10 MG TABS tablet TAKE 1 TABLET(10 MG) BY MOUTH DAILY BEFORE BREAKFAST   edoxaban (SAVAYSA) 30 MG TABS tablet Take by mouth.   enzalutamide (XTANDI) 40 MG tablet Take 4 tablets  (160 mg total) by mouth daily.   ferrous sulfate 325 (65 FE) MG tablet Take 1 tablet (325 mg total) by mouth daily.   glucose blood (ONETOUCH VERIO) test strip USE ONCE DAILY   metoprolol tartrate 37.5 MG TABS Take 37.5 mg by mouth 2 (two) times daily.   pantoprazole (PROTONIX) 40 MG tablet Take 1 tablet (40 mg total) by mouth daily.   SPIRIVA RESPIMAT 2.5 MCG/ACT AERS INHALE 2 PUFFS INTO THE LUNGS DAILY   sucralfate (CARAFATE) 1 g tablet Take 1 tablet (1 g total) by mouth 4 (four) times daily.   tamsulosin (FLOMAX) 0.4 MG CAPS capsule Take 0.4 mg by mouth daily after breakfast.   vitamin B-12 (CYANOCOBALAMIN) 1000 MCG tablet Take 1 tablet (1,000 mcg total) by mouth daily.   No facility-administered encounter medications on file as of 07/01/2022.    Surgical History: Past Surgical History:  Procedure Laterality Date   CATARACT EXTRACTION     COLONOSCOPY WITH PROPOFOL N/A 07/02/2019   Procedure: COLONOSCOPY WITH PROPOFOL;  Surgeon: Toney Reil, MD;  Location: [facility] ENDOSCOPY;  Service: Gastroenterology;  Laterality: N/A;   ESOPHAGOGASTRODUODENOSCOPY (EGD) WITH PROPOFOL N/A 03/19/2022   Procedure: ESOPHAGOGASTRODUODENOSCOPY (EGD) WITH PROPOFOL;  Surgeon: Wyline Mood, MD;  Location: [facility] ENDOSCOPY;  Service: Gastroenterology;  Laterality: N/A;   IR CT HEAD LTD  08/27/2019   IR PERCUTANEOUS ART THROMBECTOMY/INFUSION INTRACRANIAL INC DIAG ANGIO  08/27/2019  IR PERCUTANEOUS ART THROMBECTOMY/INFUSION INTRACRANIAL INC DIAG ANGIO  08/27/2019   LOWER EXTREMITY ANGIOGRAPHY Left 12/25/2020   Procedure: Lower Extremity Angiography;  Surgeon: Annice Needy, MD;  Location: [facility] INVASIVE CV LAB;  Service: Cardiovascular;  Laterality: Left;   PACEMAKER LEADLESS INSERTION N/A 12/19/2020   Procedure: PACEMAKER LEADLESS INSERTION;  Surgeon: Marcina Millard, MD;  Location: [facility] INVASIVE CV LAB;  Service: Cardiovascular;  Laterality: N/A;   PROSTATE CANCER     PROSTATE SURGERY     RADIOLOGY WITH  ANESTHESIA N/A 08/27/2019   Procedure: IR WITH ANESTHESIA;  Surgeon: Julieanne Cotton, MD;  Location: [facility] OR;  Service: Radiology;  Laterality: N/A;    Medical History: Past Medical History:  Diagnosis Date   Acute deep vein thrombosis (DVT) of femoral vein of left lower extremity (HCC)    Atrial fibrillation (HCC)    CHF (congestive heart failure) (HCC)    Chronic kidney disease    Diabetes (HCC)    Hearing loss    High blood pressure    History of bladder problems    Hyperlipidemia    Kidney stones 11/22/2014   Liver cyst    OSA (obstructive sleep apnea)    Prostate cancer (HCC)    Prostate cancer (HCC)    Stroke (HCC)    Valvular heart disease     Family History: Family History  Problem Relation Age of Onset   Colon cancer Mother    Diabetes Other    High blood pressure Other    Prostate cancer Brother    Diabetes Brother     Social History   Socioeconomic History   Marital status: Married    Spouse name: Amey Olden   Number of children: 2   Years of education: 12   Highest education level: 12th grade  Occupational History   Occupation: Airport  Tobacco Use   Smoking status: Former    Years: 16    Types: Cigarettes   Smokeless tobacco: Never  Vaping Use   Vaping Use: Never used  Substance and Sexual Activity   Alcohol use: No   Drug use: No   Sexual activity: Not Currently  Other Topics Concern   Not on file  Social History Narrative   Not on file   Social Determinants of Health   Financial Resource Strain: Low Risk  (10/29/2019)   Overall Financial Resource Strain (CARDIA)    Difficulty of Paying Living Expenses: Not very hard  Recent Concern: Financial Resource Strain - Medium Risk (09/08/2019)   Overall Financial Resource Strain (CARDIA)    Difficulty of Paying Living Expenses: Somewhat hard  Food Insecurity: No Food Insecurity (10/04/2021)   Hunger Vital Sign    Worried About Running Out of Food in the Last Year: Never true    Ran Out  of Food in the Last Year: Never true  Transportation Needs: No Transportation Needs (10/04/2021)   PRAPARE - Administrator, Civil Service (Medical): No    Lack of Transportation (Non-Medical): No  Physical Activity: Inactive (10/29/2019)   Exercise Vital Sign    Days of Exercise per Week: 0 days    Minutes of Exercise per Session: 0 min  Stress: No Stress Concern Present (10/29/2019)   [employer] of Occupational Health - Occupational Stress Questionnaire    Feeling of Stress : Only a little  Recent Concern: Stress - Stress Concern Present (09/08/2019)   [employer] of Occupational Health - Occupational Stress Questionnaire    Feeling of Stress :  To some extent  Social Connections: Moderately Integrated (10/29/2019)   Social Connection and Isolation Panel [NHANES]    Frequency of Communication with Friends and Family: More than three times a week    Frequency of Social Gatherings with Friends and Family: More than three times a week    Attends Religious Services: More than 4 times per year    Active Member of [employer] or Organizations: No    Attends Banker Meetings: Never    Marital Status: Married  Catering manager Violence: Not At Risk (10/04/2021)   Humiliation, Afraid, Rape, and Kick questionnaire    Fear of Current or Ex-Partner: No    Emotionally Abused: No    Physically Abused: No    Sexually Abused: No      Review of Systems  Constitutional:  Negative for chills, fatigue and unexpected weight change.  HENT:  Negative for congestion, postnasal drip, rhinorrhea, sneezing and sore throat.   Eyes:  Negative for redness.  Respiratory:  Negative for cough, chest tightness and shortness of breath.   Cardiovascular:  Negative for chest pain and palpitations.  Gastrointestinal:  Negative for abdominal pain, constipation, diarrhea, nausea and vomiting.  Genitourinary:  Negative for dysuria and frequency.  Musculoskeletal:  Negative for arthralgias,  back pain, joint swelling and neck pain.  Skin:  Negative for rash.  Neurological: Negative.  Negative for tremors and numbness.  Hematological:  Negative for adenopathy. Does not bruise/bleed easily.  Psychiatric/Behavioral:  Negative for behavioral problems (Depression), sleep disturbance and suicidal ideas. The patient is not nervous/anxious.     Vital Signs: BP 100/68   Pulse 89   Temp (!) 97.1 F (36.2 C)   Resp 16   Ht 6' (1.829 m)   Wt 237 lb 3.2 oz (107.6 kg)   SpO2 93%   BMI 32.17 kg/m    Physical Exam Vitals reviewed.  Constitutional:      General: He is not in acute distress.    Appearance: Normal appearance. He is obese. He is not ill-appearing.  HENT:     Head: Normocephalic and atraumatic.  Eyes:     Pupils: Pupils are equal, round, and reactive to light.  Cardiovascular:     Rate and Rhythm: Normal rate and regular rhythm.  Pulmonary:     Effort: Pulmonary effort is normal. No respiratory distress.  Skin:    General: Skin is warm and dry.  Neurological:     Mental Status: He is alert and oriented to person, place, and time.  Psychiatric:        Mood and Affect: Mood normal.        Behavior: Behavior normal.        Assessment/Plan: 1. Intermittent chest pain May be reflux related and will continue medications and follow up with both GI and cardiology  2. Atrial fibrillation with RVR (HCC) Followed by cardiology  3. Essential hypertension Low in office, but stable at home and will monitor and drink plenty of fluids   General Counseling: Nelva Bush understanding of the findings of todays visit and agrees with plan of treatment. I have discussed any further diagnostic evaluation that may be needed or ordered today. We also reviewed his medications today. he has been encouraged to call the office with any questions or concerns that should arise related to todays visit.    No orders of the defined types were placed in this  encounter.   No orders of the defined types were placed in this encounter.  This patient was seen by Drema Dallas, PA-C in collaboration with Dr. Clayborn Bigness as a part of collaborative care agreement.   Total time spent:30 Minutes Time spent includes review of chart, medications, test results, and follow up plan with the patient.      Dr Lavera Guise Internal medicine

## 2022-07-02 NOTE — Telephone Encounter (Signed)
Error

## 2022-07-03 ENCOUNTER — Ambulatory Visit: Payer: Medicare Other | Attending: Cardiology | Admitting: Cardiology

## 2022-07-03 ENCOUNTER — Encounter: Payer: Self-pay | Admitting: Cardiology

## 2022-07-03 VITALS — BP 112/78 | HR 70 | Ht 72.0 in | Wt 235.4 lb

## 2022-07-03 DIAGNOSIS — Z950 Presence of cardiac pacemaker: Secondary | ICD-10-CM | POA: Diagnosis not present

## 2022-07-03 DIAGNOSIS — R072 Precordial pain: Secondary | ICD-10-CM

## 2022-07-03 DIAGNOSIS — I10 Essential (primary) hypertension: Secondary | ICD-10-CM

## 2022-07-03 DIAGNOSIS — I4821 Permanent atrial fibrillation: Secondary | ICD-10-CM | POA: Diagnosis not present

## 2022-07-03 DIAGNOSIS — R0609 Other forms of dyspnea: Secondary | ICD-10-CM | POA: Diagnosis not present

## 2022-07-03 MED ORDER — FUROSEMIDE 20 MG PO TABS: 20.0000 mg | ORAL_TABLET | Freq: Every day | ORAL | 3 refills | Status: DC

## 2022-07-03 NOTE — Progress Notes (Signed)
Cardiology Office Note:    Date:  07/03/2022   ID:  [name], DOB May 30, 1933, MRN [id number]  PCP:  Glenn Kuster, NP   [facility] Providers Cardiologist:  Glenn Odea, MD     Referring MD: Glenn Jarvis, MD   Chief Complaint  Patient presents with   New Patient (Initial Visit)    Referred by ED 06/30/22 ED visit for chest pain with cardiac history and followed by [facility] cardiology.  Patient would like to transfer his cardiac care to Dr. Azucena Cecil.    History of Present Illness:    [name] is a 89 y.o. male with a hx of permanent A-fib, hypertension, CKD 4, DVT, s/p leadless pacemaker 11/2020, metastatic prostate cancer on chemo, abdominal hernia who presents to establish care.  Previously seen by [facility] cardiology from a cardiac perspective.  Cardiologist recently retired, patient switching care.  Has a history of permanent atrial fibrillation, previously on Eliquis, switched to edoxaban due to concerns for Eliquis interacting with prostate cancer treatments.  Seen in the ED last week with symptoms of chest pain.  Symptoms ongoing for several months.  Also endorses shortness of breath with exertion and leg edema.  Workup in the ED with EKG and troponins do not suggest ACS.  Chest pain occurs sometimes with walking, or bending over.  Patient states having pacemaker placed, not sure indication possibly from sick sinus syndrome.  Outside echo 2022 EF 50%  Past Medical History:  Diagnosis Date   Acute deep vein thrombosis (DVT) of femoral vein of left lower extremity (HCC)    Atrial fibrillation (HCC)    CHF (congestive heart failure) (HCC)    Chronic kidney disease    Diabetes (HCC)    Hearing loss    High blood pressure    History of bladder problems    Hyperlipidemia    Kidney stones 11/22/2014   Liver cyst    OSA (obstructive sleep apnea)    Prostate cancer (HCC)    Prostate cancer (HCC)    Stroke (HCC)    Valvular heart disease     Past  Surgical History:  Procedure Laterality Date   CATARACT EXTRACTION     COLONOSCOPY WITH PROPOFOL N/A 07/02/2019   Procedure: COLONOSCOPY WITH PROPOFOL;  Surgeon: Toney Reil, MD;  Location: [facility] ENDOSCOPY;  Service: Gastroenterology;  Laterality: N/A;   ESOPHAGOGASTRODUODENOSCOPY (EGD) WITH PROPOFOL N/A 03/19/2022   Procedure: ESOPHAGOGASTRODUODENOSCOPY (EGD) WITH PROPOFOL;  Surgeon: Wyline Mood, MD;  Location: [facility] ENDOSCOPY;  Service: Gastroenterology;  Laterality: N/A;   IR CT HEAD LTD  08/27/2019   IR PERCUTANEOUS ART THROMBECTOMY/INFUSION INTRACRANIAL INC DIAG ANGIO  08/27/2019       IR PERCUTANEOUS ART THROMBECTOMY/INFUSION INTRACRANIAL INC DIAG ANGIO  08/27/2019   LOWER EXTREMITY ANGIOGRAPHY Left 12/25/2020   Procedure: Lower Extremity Angiography;  Surgeon: Annice Needy, MD;  Location: [facility] INVASIVE CV LAB;  Service: Cardiovascular;  Laterality: Left;   PACEMAKER LEADLESS INSERTION N/A 12/19/2020   Procedure: PACEMAKER LEADLESS INSERTION;  Surgeon: Marcina Millard, MD;  Location: [facility] INVASIVE CV LAB;  Service: Cardiovascular;  Laterality: N/A;   PROSTATE CANCER     PROSTATE SURGERY     RADIOLOGY WITH ANESTHESIA N/A 08/27/2019   Procedure: IR WITH ANESTHESIA;  Surgeon: Julieanne Cotton, MD;  Location: [facility] OR;  Service: Radiology;  Laterality: N/A;    Current Medications: Current Meds  Medication Sig   albuterol (VENTOLIN HFA) 108 (90 Base) MCG/ACT inhaler Inhale 2 puffs into the lungs every 4 (  four) hours as needed for wheezing or shortness of breath.   atorvastatin (LIPITOR) 10 MG tablet TAKE 1 TABLET(10 MG) BY MOUTH DAILY   benzonatate (TESSALON PERLES) 100 MG capsule Take 1 capsule (100 mg total) by mouth 3 (three) times daily as needed for cough.   dapagliflozin propanediol (FARXIGA) 10 MG TABS tablet TAKE 1 TABLET(10 MG) BY MOUTH DAILY BEFORE BREAKFAST   edoxaban (SAVAYSA) 30 MG TABS tablet Take by mouth.   enzalutamide (XTANDI) 40 MG tablet Take 4 tablets (160 mg total)  by mouth daily.   ferrous sulfate 325 (65 FE) MG tablet Take 1 tablet (325 mg total) by mouth daily.   furosemide (LASIX) 20 MG tablet Take 1 tablet (20 mg total) by mouth daily.   glucose blood (ONETOUCH VERIO) test strip USE ONCE DAILY   metoprolol tartrate 37.5 MG TABS Take 37.5 mg by mouth 2 (two) times daily.   pantoprazole (PROTONIX) 40 MG tablet Take 1 tablet (40 mg total) by mouth daily.   SPIRIVA RESPIMAT 2.5 MCG/ACT AERS INHALE 2 PUFFS INTO THE LUNGS DAILY   sucralfate (CARAFATE) 1 g tablet Take 1 tablet (1 g total) by mouth 4 (four) times daily.   tamsulosin (FLOMAX) 0.4 MG CAPS capsule Take 0.4 mg by mouth daily after breakfast.   vitamin B-12 (CYANOCOBALAMIN) 1000 MCG tablet Take 1 tablet (1,000 mcg total) by mouth daily.     Allergies:   Patient has no known allergies.   Social History   Socioeconomic History   Marital status: Married    Spouse name: [name]   Number of children: 2   Years of education: 12   Highest education level: 12th grade  Occupational History   Occupation: [employer]  Tobacco Use   Smoking status: Former    Years: 16    Types: Cigarettes   Smokeless tobacco: Never  Vaping Use   Vaping Use: Never used  Substance and Sexual Activity   Alcohol use: No   Drug use: No   Sexual activity: Not Currently  Other Topics Concern   Not on file  Social History Narrative   Not on file   Social Determinants of Health   Financial Resource Strain: Low Risk  (10/29/2019)   Overall Financial Resource Strain (CARDIA)    Difficulty of Paying Living Expenses: Not very hard  Recent Concern: Financial Resource Strain - Medium Risk (09/08/2019)   Overall Financial Resource Strain (CARDIA)    Difficulty of Paying Living Expenses: Somewhat hard  Food Insecurity: No Food Insecurity (10/04/2021)   Hunger Vital Sign    Worried About Running Out of Food in the Last Year: Never true    Ran Out of Food in the Last Year: Never true  Transportation Needs: No  Transportation Needs (10/04/2021)   PRAPARE - Administrator, Civil Service (Medical): No    Lack of Transportation (Non-Medical): No  Physical Activity: Inactive (10/29/2019)   Exercise Vital Sign    Days of Exercise per Week: 0 days    Minutes of Exercise per Session: 0 min  Stress: No Stress Concern Present (10/29/2019)   [employer] of Occupational Health - Occupational Stress Questionnaire    Feeling of Stress : Only a little  Recent Concern: Stress - Stress Concern Present (09/08/2019)   [employer] of Occupational Health - Occupational Stress Questionnaire    Feeling of Stress : To some extent  Social Connections: Moderately Integrated (10/29/2019)   Social Connection and Isolation Panel [NHANES]  Frequency of Communication with Friends and Family: More than three times a week    Frequency of Social Gatherings with Friends and Family: More than three times a week    Attends Religious Services: More than 4 times per year    Active Member of [employer] or Organizations: No    Attends Engineer, structural: Never    Marital Status: Married     Family History: The patient's family history includes Colon cancer in his mother; Diabetes in his brother and another family member; High blood pressure in an other family member; Prostate cancer in his brother.  ROS:   Please see the history of present illness.     All other systems reviewed and are negative.  EKGs/Labs/Other Studies Reviewed:    The following studies were reviewed today:   EKG:  EKG is  ordered today.  The ekg ordered today demonstrates V paced rhythm, underlying A-fib  Recent Labs: 10/03/2021: B Natriuretic Peptide 144.8 10/04/2021: Magnesium 1.9 06/30/2022: ALT 13; BUN 32; Creatinine, Ser 1.89; Hemoglobin 15.7; Platelets 179; Potassium 4.2; Sodium 136  Recent Lipid Panel    Component Value Date/Time   CHOL 78 08/28/2019 0628   CHOL 97 (L) 06/11/2019 0953   CHOL 140 10/12/2013 0732    TRIG 44 08/28/2019 0628   TRIG 139 10/12/2013 0732   HDL 42 08/28/2019 0628   HDL 47 06/11/2019 0953   HDL 43 10/12/2013 0732   CHOLHDL 1.9 08/28/2019 0628   VLDL 9 08/28/2019 0628   VLDL 28 10/12/2013 0732   LDLCALC 27 08/28/2019 0628   LDLCALC 34 06/11/2019 0953   LDLCALC 69 10/12/2013 0732     Risk Assessment/Calculations:              Physical Exam:    VS:  BP 112/78 (BP Location: Left Arm, Patient Position: Sitting, Cuff Size: Normal)   Pulse 70   Ht 6' (1.829 m)   Wt 235 lb 6.4 oz (106.8 kg)   SpO2 95%   BMI 31.93 kg/m     Wt Readings from Last 3 Encounters:  07/03/22 235 lb 6.4 oz (106.8 kg)  07/01/22 237 lb 3.2 oz (107.6 kg)  06/30/22 229 lb 4.5 oz (104 kg)     GEN:  Well nourished, well developed in no acute distress HEENT: Normal NECK: No JVD; No carotid bruits CARDIAC: RRR, no murmurs, rubs, gallops RESPIRATORY: Diminished breath sounds at bases bilaterally ABDOMEN: Soft, non-tender, distended MUSCULOSKELETAL:  1+ edema; No deformity  SKIN: Warm and dry NEUROLOGIC:  Alert and oriented x 3 PSYCHIATRIC:  Normal affect   ASSESSMENT:    1. Precordial pain   2. Dyspnea on exertion   3. Permanent atrial fibrillation (HCC)   4. Primary hypertension   5. Pacemaker    PLAN:    In order of problems listed above:  Chest pain, sometimes associated with bending, get echocardiogram, get Lexiscan Myoview.  Unsure if abdominal hernia/processes contributing. Dyspnea on exertion, abdominal distention, 1+ edema.  Start Lasix 20 mg daily, check BMP in 10 days. Permanent A-fib, continue Lopressor, edoxaban 30 mg daily.  Edoxaban dose appropriate for CKD. Hypertension, BP controlled.  Continue Lopressor 37.5 mg twice daily S/p leadless pacemaker.  Establish care with device clinic.  Follow-up after cardiac testing      Informed Consent   Shared Decision Making/Informed Consent The risks [chest pain, shortness of breath, cardiac arrhythmias, dizziness, blood  pressure fluctuations, myocardial infarction, stroke/transient ischemic attack, nausea, vomiting, allergic reaction, radiation exposure, metallic  taste sensation and life-threatening complications (estimated to be 1 in 10,000)], benefits (risk stratification, diagnosing coronary artery disease, treatment guidance) and alternatives of a nuclear stress test were discussed in detail with [name] and he agrees to proceed.       Medication Adjustments/Labs and Tests Ordered: Current medicines are reviewed at length with the patient today.  Concerns regarding medicines are outlined above.  Orders Placed This Encounter  Procedures   NM Myocar Multi W/Spect W/Wall Motion / EF   Basic Metabolic Panel (BMET)   EKG 12-Lead   ECHOCARDIOGRAM COMPLETE   Meds ordered this encounter  Medications   furosemide (LASIX) 20 MG tablet    Sig: Take 1 tablet (20 mg total) by mouth daily.    Dispense:  90 tablet    Refill:  3    Patient Instructions  Medication Instructions:   START Furosemide (Lasix) - Take one tablet ( 20 mg) by mouth daily.   *If you need a refill on your cardiac medications before your next appointment, please call your pharmacy*   Lab Work:  Your physician recommends that you return for lab work in: 10 days at the medical mall. You will need to be fasting.  No appt is needed. Hours are M-F 7AM- 6 PM.  If you have labs (blood work) drawn today and your tests are completely normal, you will receive your results only by: MyChart Message (if you have MyChart) OR A paper copy in the mail If you have any lab test that is abnormal or we need to change your treatment, we will call you to review the results.   Testing/Procedures:  Your physician has requested that you have an echocardiogram. Echocardiography is a painless test that uses sound waves to create images of your heart. It provides your doctor with information about the size and shape of your heart and how well your  heart's chambers and valves are working. This procedure takes approximately one hour. There are no restrictions for this procedure. Please do NOT wear cologne, perfume, aftershave, or lotions (deodorant is allowed). Please arrive 15 minutes prior to your appointment time.   [facility] MYOVIEW  Your Provider has ordered a Stress Test with nuclear imaging. The purpose of this test is to evaluate the blood supply to your heart muscle. This procedure is referred to as a "Non-Invasive Stress Test." This is because other than having an IV started in your vein, nothing is inserted or "invades" your body. Cardiac stress tests are done to find areas of poor blood flow to the heart by determining the extent of coronary artery disease (CAD). Some patients exercise on a treadmill, which naturally increases the blood flow to your heart, while others who are unable to walk on a treadmill due to physical limitations have a pharmacologic/chemical stress agent called Lexiscan. This medicine will mimic walking on a treadmill by temporarily increasing your coronary blood flow.     REPORT TO [facility] MEDICAL MALL ENTRANCE  **Proceed to the 1st desk on the right, REGISTRATION, to check in**  Please note: this test may take anywhere between 2-4 hours to complete    Instructions regarding medication:   _XX__:   You may take all of your regular morning medications the day of your test unless listed below.   _XX___:  HOLD furosemide the morning of the test      How to prepare for your Myoview test:  Do not eat or drink for 6 hours prior to the test No  caffeine for 24 hours prior to the test No smoking 24 hours prior to the test. Ladies, please do not wear dresses.  Skirts or pants are appropriate. Please wear a short sleeve shirt. No perfume, cologne or lotion. Wear comfortable walking shoes. No heels!   PLEASE NOTIFY THE OFFICE AT LEAST 24 HOURS IN ADVANCE IF YOU ARE UNABLE TO KEEP YOUR APPOINTMENT.   [phone] AND  PLEASE NOTIFY NUCLEAR MEDICINE AT [facility] AT LEAST 24 HOURS IN ADVANCE IF YOU ARE UNABLE TO KEEP YOUR APPOINTMENT. [phone]      Follow-Up: At [facility], you and your health needs are our priority.  As part of our continuing mission to provide you with exceptional heart care, we have created designated Provider Care Teams.  These Care Teams include your primary Cardiologist (physician) and Advanced Practice Providers (APPs -  Physician Assistants and Nurse Practitioners) who all work together to provide you with the care you need, when you need it.  We recommend signing up for the patient portal called "MyChart".  Sign up information is provided on this After Visit Summary.  MyChart is used to connect with patients for Virtual Visits (Telemedicine).  Patients are able to view lab/test results, encounter notes, upcoming appointments, etc.  Non-urgent messages can be sent to your provider as well.   To learn more about what you can do with MyChart, go to [email or web address].    Your next appointment:    After testing  Provider:   You may see Glenn Odea, MD or one of the following Advanced Practice Providers on your designated Care Team:   Nicolasa Ducking, NP Eula Listen, PA-C Cadence Fransico Michael, PA-C Charlsie Quest, NP   Signed, Glenn Odea, MD  07/03/2022 11:56 AM    [facility]

## 2022-07-03 NOTE — Patient Instructions (Signed)
Medication Instructions:   START Furosemide (Lasix) - Take one tablet ( 20 mg) by mouth daily.   *If you need a refill on your cardiac medications before your next appointment, please call your pharmacy*   Lab Work:  Your physician recommends that you return for lab work in: 10 days at the medical mall. You will need to be fasting.  No appt is needed. Hours are M-F 7AM- 6 PM.  If you have labs (blood work) drawn today and your tests are completely normal, you will receive your results only by: MyChart Message (if you have MyChart) OR A paper copy in the mail If you have any lab test that is abnormal or we need to change your treatment, we will call you to review the results.   Testing/Procedures:  Your physician has requested that you have an echocardiogram. Echocardiography is a painless test that uses sound waves to create images of your heart. It provides your doctor with information about the size and shape of your heart and how well your heart's chambers and valves are working. This procedure takes approximately one hour. There are no restrictions for this procedure. Please do NOT wear cologne, perfume, aftershave, or lotions (deodorant is allowed). Please arrive 15 minutes prior to your appointment time.   [facility] MYOVIEW  Your Provider has ordered a Stress Test with nuclear imaging. The purpose of this test is to evaluate the blood supply to your heart muscle. This procedure is referred to as a "Non-Invasive Stress Test." This is because other than having an IV started in your vein, nothing is inserted or "invades" your body. Cardiac stress tests are done to find areas of poor blood flow to the heart by determining the extent of coronary artery disease (CAD). Some patients exercise on a treadmill, which naturally increases the blood flow to your heart, while others who are unable to walk on a treadmill due to physical limitations have a pharmacologic/chemical stress agent called  Lexiscan. This medicine will mimic walking on a treadmill by temporarily increasing your coronary blood flow.     REPORT TO [facility] MEDICAL MALL ENTRANCE  **Proceed to the 1st desk on the right, REGISTRATION, to check in**  Please note: this test may take anywhere between 2-4 hours to complete    Instructions regarding medication:   _XX__:   You may take all of your regular morning medications the day of your test unless listed below.   _XX___:  HOLD furosemide the morning of the test      How to prepare for your Myoview test:  Do not eat or drink for 6 hours prior to the test No caffeine for 24 hours prior to the test No smoking 24 hours prior to the test. Ladies, please do not wear dresses.  Skirts or pants are appropriate. Please wear a short sleeve shirt. No perfume, cologne or lotion. Wear comfortable walking shoes. No heels!   PLEASE NOTIFY THE OFFICE AT LEAST 24 HOURS IN ADVANCE IF YOU ARE UNABLE TO KEEP YOUR APPOINTMENT.  [phone] AND  PLEASE NOTIFY NUCLEAR MEDICINE AT [facility] AT LEAST 24 HOURS IN ADVANCE IF YOU ARE UNABLE TO KEEP YOUR APPOINTMENT. [phone]      Follow-Up: At [facility], you and your health needs are our priority.  As part of our continuing mission to provide you with exceptional heart care, we have created designated Provider Care Teams.  These Care Teams include your primary Cardiologist (physician) and Advanced Practice Providers (APPs -  Physician Assistants and Nurse Practitioners) who all work together to provide you with the care you need, when you need it.  We recommend signing up for the patient portal called "MyChart".  Sign up information is provided on this After Visit Summary.  MyChart is used to connect with patients for Virtual Visits (Telemedicine).  Patients are able to view lab/test results, encounter notes, upcoming appointments, etc.  Non-urgent messages can be sent to your provider as well.   To learn more about what  you can do with MyChart, go to [email or web address].    Your next appointment:    After testing  Provider:   You may see Debbe Odea, MD or one of the following Advanced Practice Providers on your designated Care Team:   Nicolasa Ducking, NP Eula Listen, PA-C Cadence Fransico Michael, PA-C Charlsie Quest, NP

## 2022-07-08 ENCOUNTER — Encounter
Admission: RE | Admit: 2022-07-08 | Discharge: 2022-07-08 | Disposition: A | Discharge status: Home / Self Care | Payer: Medicare Other | Source: Ambulatory Visit | Attending: Cardiology | Admitting: Cardiology

## 2022-07-08 DIAGNOSIS — R0609 Other forms of dyspnea: Secondary | ICD-10-CM | POA: Diagnosis not present

## 2022-07-08 DIAGNOSIS — R072 Precordial pain: Secondary | ICD-10-CM | POA: Diagnosis not present

## 2022-07-08 DIAGNOSIS — I4821 Permanent atrial fibrillation: Secondary | ICD-10-CM | POA: Diagnosis not present

## 2022-07-08 LAB — NM MYOCAR MULTI W/SPECT W/WALL MOTION / EF
LV dias vol: 110 mL (ref 62–150)
LV sys vol: 49 mL
Nuc Stress EF: 55 %
Peak HR: 99 {beats}/min
Percent HR: 74 %
Rest HR: 75 {beats}/min
Rest Nuclear Isotope Dose: 10.8 mCi
SDS: 2
SRS: 1
SSS: 10
Stress Nuclear Isotope Dose: 32 mCi
TID: 0.94

## 2022-07-08 MED ORDER — TECHNETIUM TC 99M TETROFOSMIN IV KIT
31.9500 | PACK | Freq: Once | INTRAVENOUS | Status: AC | PRN
  Administered 2022-07-08: 31.95 via INTRAVENOUS

## 2022-07-08 MED ORDER — REGADENOSON 0.4 MG/5ML IV SOLN
0.4000 mg | Freq: Once | INTRAVENOUS | Status: AC
  Administered 2022-07-08: 0.4 mg via INTRAVENOUS
  Filled 2022-07-08: qty 5

## 2022-07-08 MED ORDER — TECHNETIUM TC 99M TETROFOSMIN IV KIT
10.0000 | PACK | Freq: Once | INTRAVENOUS | Status: AC | PRN
  Administered 2022-07-08: 10.79 via INTRAVENOUS

## 2022-07-09 ENCOUNTER — Ambulatory Visit: Payer: Medicare Other | Attending: Cardiology

## 2022-07-09 DIAGNOSIS — R0609 Other forms of dyspnea: Secondary | ICD-10-CM | POA: Diagnosis not present

## 2022-07-09 DIAGNOSIS — R072 Precordial pain: Secondary | ICD-10-CM | POA: Diagnosis not present

## 2022-07-09 DIAGNOSIS — I4821 Permanent atrial fibrillation: Secondary | ICD-10-CM | POA: Diagnosis not present

## 2022-07-09 DIAGNOSIS — M1712 Unilateral primary osteoarthritis, left knee: Secondary | ICD-10-CM | POA: Diagnosis not present

## 2022-07-09 LAB — ECHOCARDIOGRAM COMPLETE: S' Lateral: 4.3 cm

## 2022-07-12 ENCOUNTER — Inpatient Hospital Stay: Payer: Medicare Other | Attending: Oncology

## 2022-07-12 DIAGNOSIS — Z7984 Long term (current) use of oral hypoglycemic drugs: Secondary | ICD-10-CM | POA: Diagnosis not present

## 2022-07-12 DIAGNOSIS — N184 Chronic kidney disease, stage 4 (severe): Secondary | ICD-10-CM | POA: Diagnosis not present

## 2022-07-12 DIAGNOSIS — I4891 Unspecified atrial fibrillation: Secondary | ICD-10-CM | POA: Diagnosis not present

## 2022-07-12 DIAGNOSIS — C61 Malignant neoplasm of prostate: Secondary | ICD-10-CM | POA: Insufficient documentation

## 2022-07-12 DIAGNOSIS — Z7901 Long term (current) use of anticoagulants: Secondary | ICD-10-CM | POA: Insufficient documentation

## 2022-07-12 DIAGNOSIS — C772 Secondary and unspecified malignant neoplasm of intra-abdominal lymph nodes: Secondary | ICD-10-CM | POA: Diagnosis not present

## 2022-07-12 DIAGNOSIS — E1122 Type 2 diabetes mellitus with diabetic chronic kidney disease: Secondary | ICD-10-CM | POA: Diagnosis not present

## 2022-07-12 DIAGNOSIS — Z79899 Other long term (current) drug therapy: Secondary | ICD-10-CM | POA: Insufficient documentation

## 2022-07-12 LAB — CMP (CANCER CENTER ONLY)
ALT: 14 U/L (ref 0–44)
AST: 18 U/L (ref 15–41)
Albumin: 3.6 g/dL (ref 3.5–5.0)
Alkaline Phosphatase: 132 U/L — ABNORMAL HIGH (ref 38–126)
Anion gap: 9 (ref 5–15)
BUN: 38 mg/dL — ABNORMAL HIGH (ref 8–23)
CO2: 25 mmol/L (ref 22–32)
Calcium: 9.4 mg/dL (ref 8.9–10.3)
Chloride: 104 mmol/L (ref 98–111)
Creatinine: 1.87 mg/dL — ABNORMAL HIGH (ref 0.61–1.24)
GFR, Estimated: 34 mL/min — ABNORMAL LOW (ref >60)
Glucose, Bld: 117 mg/dL — ABNORMAL HIGH (ref 70–99)
Potassium: 4.8 mmol/L (ref 3.5–5.1)
Sodium: 138 mmol/L (ref 135–145)
Total Bilirubin: 0.8 mg/dL (ref 0.3–1.2)
Total Protein: 7.5 g/dL (ref 6.5–8.1)

## 2022-07-12 LAB — CBC WITH DIFFERENTIAL (CANCER CENTER ONLY)
Abs Immature Granulocytes: 0.02 10*3/uL (ref 0.00–0.07)
Basophils Absolute: 0 10*3/uL (ref 0.0–0.1)
Basophils Relative: 0 %
Eosinophils Absolute: 0.1 10*3/uL (ref 0.0–0.5)
Eosinophils Relative: 2 %
HCT: 48.5 % (ref 39.0–52.0)
Hemoglobin: 15.5 g/dL (ref 13.0–17.0)
Immature Granulocytes: 0 %
Lymphocytes Relative: 32 %
Lymphs Abs: 1.8 10*3/uL (ref 0.7–4.0)
MCH: 30.5 pg (ref 26.0–34.0)
MCHC: 32 g/dL (ref 30.0–36.0)
MCV: 95.5 fL (ref 80.0–100.0)
Monocytes Absolute: 0.7 10*3/uL (ref 0.1–1.0)
Monocytes Relative: 13 %
Neutro Abs: 2.9 10*3/uL (ref 1.7–7.7)
Neutrophils Relative %: 53 %
Platelet Count: 183 10*3/uL (ref 150–400)
RBC: 5.08 MIL/uL (ref 4.22–5.81)
RDW: 15 % (ref 11.5–15.5)
WBC Count: 5.5 10*3/uL (ref 4.0–10.5)
nRBC: 0 % (ref 0.0–0.2)

## 2022-07-12 LAB — PSA: Prostatic Specific Antigen: 2.71 ng/mL (ref 0.00–4.00)

## 2022-07-17 ENCOUNTER — Other Ambulatory Visit: Payer: Medicare Other

## 2022-07-17 ENCOUNTER — Inpatient Hospital Stay: Payer: Medicare Other | Admitting: Oncology

## 2022-07-17 ENCOUNTER — Encounter: Payer: Self-pay | Admitting: Oncology

## 2022-07-17 ENCOUNTER — Inpatient Hospital Stay: Payer: Medicare Other

## 2022-07-17 ENCOUNTER — Other Ambulatory Visit (HOSPITAL_COMMUNITY): Payer: Self-pay

## 2022-07-17 VITALS — BP 125/87 | HR 78 | Temp 97.0°F | Resp 16 | Ht 72.0 in | Wt 234.0 lb

## 2022-07-17 DIAGNOSIS — I4811 Longstanding persistent atrial fibrillation: Secondary | ICD-10-CM | POA: Diagnosis not present

## 2022-07-17 DIAGNOSIS — IMO0001 Reserved for inherently not codable concepts without codable children: Secondary | ICD-10-CM

## 2022-07-17 DIAGNOSIS — Z79818 Long term (current) use of other agents affecting estrogen receptors and estrogen levels: Secondary | ICD-10-CM | POA: Diagnosis not present

## 2022-07-17 DIAGNOSIS — N184 Chronic kidney disease, stage 4 (severe): Secondary | ICD-10-CM

## 2022-07-17 DIAGNOSIS — Z7901 Long term (current) use of anticoagulants: Secondary | ICD-10-CM | POA: Diagnosis not present

## 2022-07-17 DIAGNOSIS — Z79899 Other long term (current) drug therapy: Secondary | ICD-10-CM | POA: Diagnosis not present

## 2022-07-17 DIAGNOSIS — C61 Malignant neoplasm of prostate: Secondary | ICD-10-CM

## 2022-07-17 DIAGNOSIS — Z7984 Long term (current) use of oral hypoglycemic drugs: Secondary | ICD-10-CM | POA: Diagnosis not present

## 2022-07-17 DIAGNOSIS — I4891 Unspecified atrial fibrillation: Secondary | ICD-10-CM | POA: Diagnosis not present

## 2022-07-17 DIAGNOSIS — C772 Secondary and unspecified malignant neoplasm of intra-abdominal lymph nodes: Secondary | ICD-10-CM | POA: Diagnosis not present

## 2022-07-17 DIAGNOSIS — E1122 Type 2 diabetes mellitus with diabetic chronic kidney disease: Secondary | ICD-10-CM | POA: Diagnosis not present

## 2022-07-17 MED ORDER — LEUPROLIDE ACETATE (6 MONTH) 45 MG ~~LOC~~ KIT
45.0000 mg | PACK | Freq: Once | SUBCUTANEOUS | Status: AC
  Administered 2022-07-17: 45 mg via SUBCUTANEOUS
  Filled 2022-07-17: qty 45

## 2022-07-17 MED ORDER — ENZALUTAMIDE 40 MG PO TABS
160.0000 mg | ORAL_TABLET | Freq: Every day | ORAL | 2 refills | Status: DC
Start: 2022-07-17 — End: 2022-10-15
  Filled 2022-07-17: qty 120, 30d supply, fill #0
  Filled 2022-08-14: qty 120, 30d supply, fill #1
  Filled 2022-09-17: qty 120, 30d supply, fill #2

## 2022-07-17 NOTE — Assessment & Plan Note (Signed)
Eligard 45 mg every 6 months.- Eligard today

## 2022-07-17 NOTE — Assessment & Plan Note (Signed)
Encourage oral hydration and avoid nephrotoxins.   

## 2022-07-17 NOTE — Assessment & Plan Note (Addendum)
rostate cancer recurrence.  Non- regional (retroperitoneal) lymphadenopathy -M1a Labs reviewed and discussed with patient.  PSA progressively trending up to 83. 01/05/2021 Bone scan showed No convincing scintigraphic evidence of bony metastatic disease. 01/05/2021 Mild increase in size of left retroperitoneal, right common iliac and right external iliac lymph nodes compatible with progression of disease.No CT findings of osseous metastasis. Labs are reviewed and discussed with patient. PSA remains low.  Patient is on androgen deprivation therapy with Eligard 45 mg every 6 months- proceed today next due Dec 2024 Clinically he tolerates Xtandi 160mg  daily well. Continue current regimen

## 2022-07-17 NOTE — Progress Notes (Signed)
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 moderate severity cardiac silhouette enlargement. Mild, stable elevation of the left hemidiaphragm is seen. Mild, chronic appearing increased lung markings are seen with mild atelectasis noted within the bilateral lung bases. No pleural effusion or pneumothorax is identified. The visualized skeletal structures are unremarkable. IMPRESSION: 1. Stable cardiomegaly with mild, stable bibasilar atelectasis. Electronically Signed   By: Aram Candela M.D.   On: 06/30/2022 20:49   DG Chest 2 View  Result Date: 06/27/2022 CLINICAL DATA:  Chest pain. EXAM: CHEST - 2 VIEW COMPARISON:  November 13, 2021. FINDINGS: Stable cardiomegaly. Elevated left hemidiaphragm is noted. Minimal bibasilar subsegmental atelectasis or scarring is noted. Bony thorax is unremarkable. IMPRESSION: Minimal bibasilar subsegmental atelectasis or scarring. Electronically Signed   By: Lupita Raider M.D.   On: 06/27/2022 15:15

## 2022-07-17 NOTE — Assessment & Plan Note (Signed)
Currently on Edoxaban, follow up with cardiology 

## 2022-07-19 DIAGNOSIS — G8929 Other chronic pain: Secondary | ICD-10-CM | POA: Diagnosis not present

## 2022-07-19 DIAGNOSIS — M25562 Pain in left knee: Secondary | ICD-10-CM | POA: Diagnosis not present

## 2022-07-21 DIAGNOSIS — G4733 Obstructive sleep apnea (adult) (pediatric): Secondary | ICD-10-CM | POA: Diagnosis not present

## 2022-07-22 ENCOUNTER — Other Ambulatory Visit (HOSPITAL_COMMUNITY): Payer: Self-pay

## 2022-07-22 NOTE — Progress Notes (Deleted)
Cardiology Office Note    Date:  07/22/2022   ID:  [name], DOB 06/23/1933, MRN [id number]  PCP:  Glenn Kuster, NP  Cardiologist:  Glenn Odea, MD  Electrophysiologist:  None   Chief Complaint: Follow-up  History of Present Illness:   [name] is a 89 y.o. male with history of coronary artery calcification noted on CT imaging, aortic atherosclerosis, HFpEF, permanent A-fib on edoxaban, reported sick sinus syndrome with symptomatic bradycardia status post leadless pacemaker in 11/2020, infrarenal AAA measuring up to 3.2 cm by CT imaging in 09/2021, bilateral common iliac artery aneurysmal dilatation up to 2.1 cm on both sides, ischemic left lower extremity status post mechanical thrombectomy to the left popliteal artery, left anterior tibial artery, and left peroneal artery as well as percutaneous transluminal angioplasty of the left peroneal artery in 12/2020 followed by vascular surgery, CKD stage III-IV, CVA in 2021, recurrent prostate cancer status post radical prostatectomy and chemotherapy, rectal bleeding in 06/2019 with history of diverticulosis and distal rectal ulcer felt to be secondary to radiation proctitis, DM2, HTN, HLD, and abdominal hernia who presents for follow-up of echo and Lexiscan MPI.  He was previously followed by Dr. Gwen Werner, with [name].  See below for detailed myocardial imaging reports.  Prior cardiology note indicates a Holter monitor showing frequent PVCs, ventricular bigeminy, atrial fibrillation, sick sinus syndrome, and symptomatic bradycardia.  In this setting, outside cardiology group placed a leadless pacemaker on 12/19/2020.  ABIs normal bilaterally in 10/2021.  He was seen in the ED twice in early 06/2022 with a several month history of intermittent chest pain.  EKG not suggestive of ACS.  High-sensitivity troponin negative x 1 on 6/6 with repeat high-sensitivity troponin ER visit 06/30/2022 with an initial troponin of 17  with a delta troponin of 20.  He established care with [name] in 06/2022 for further evaluation.  He reported intermittent chest discomfort sometimes with ambulation or bending over along with exertional dyspnea and lower extremity swelling.  EKG reported as V-paced rhythm with underlying A-fib.  He was started on furosemide 20 mg daily.  Lexiscan MPI in 06/2022 showed no significant ischemia or scar with an EF of 55%.  Leadless pacemaker was noted in the RV apex.  Coronary artery calcification noted on CT imaging.  Elevation of the right hemidiaphragm was again noted as well as small calcifications in the left kidney compatible with previously described renal calculi.  Overall, this was a low risk study.  Echo in 06/2022 showed an EF of 55 to 60%, no regional wall motion device, moderate LVH, indeterminate LV diastolic function parameters, normal RV systolic function and ventricular cavity size, RVSP 40.3 mmHg, severely dilated left atrium, mild mitral regurgitation, moderate tricuspid regurgitation, aortic valve sclerosis without evidence of stenosis, and an estimated right atrial pressure of 3 mmHg.  ***   Labs independently reviewed: 06/2022 - Hgb 15.5, PLT 183, potassium 4.8, BUN 38, serum creatinine 1.87, albumin 3.6, AST/ALT normal 05/2022 - A1c 7.6 08/2019 - TC 78, TG 44, HDL 42, LDL 27 06/2019 - TSH normal  Past Medical History:  Diagnosis Date   Acute deep vein thrombosis (DVT) of femoral vein of left lower extremity (HCC)    Atrial fibrillation (HCC)    CHF (congestive heart failure) (HCC)    Chronic kidney disease    Diabetes (HCC)    Hearing loss    High blood pressure    History of bladder problems    Hyperlipidemia  Kidney stones 11/22/2014   Liver cyst    OSA (obstructive sleep apnea)    Prostate cancer (HCC)    Prostate cancer (HCC)    Stroke (HCC)    Valvular heart disease     Past Surgical History:  Procedure Laterality Date   CATARACT EXTRACTION      COLONOSCOPY WITH PROPOFOL N/A 07/02/2019   Procedure: COLONOSCOPY WITH PROPOFOL;  Surgeon: Toney Reil, MD;  Location: [facility] ENDOSCOPY;  Service: Gastroenterology;  Laterality: N/A;   ESOPHAGOGASTRODUODENOSCOPY (EGD) WITH PROPOFOL N/A 03/19/2022   Procedure: ESOPHAGOGASTRODUODENOSCOPY (EGD) WITH PROPOFOL;  Surgeon: Wyline Mood, MD;  Location[facility] ENDOSCOPY;  Service: Gastroenterology;  Laterality: N/A;   IR CT HEAD LTD  08/27/2019   IR PERCUTANEOUS ART THROMBECTOMY/INFUSION INTRACRANIAL INC DIAG ANGIO  08/27/2019       IR PERCUTANEOUS ART THROMBECTOMY/INFUSION INTRACRANIAL INC DIAG ANGIO  08/27/2019   LOWER EXTREMITY ANGIOGRAPHY Left 12/25/2020   Procedure: Lower Extremity Angiography;  Surgeon: Annice Needy, MD;  Location: [facility] INVASIVE CV LAB;  Service: Cardiovascular;  Laterality: Left;   PACEMAKER LEADLESS INSERTION N/A 12/19/2020   Procedure: PACEMAKER LEADLESS INSERTION;  Surgeon: Marcina Millard, MD;  Location: [facility] INVASIVE CV LAB;  Service: Cardiovascular;  Laterality: N/A;   PROSTATE CANCER     PROSTATE SURGERY     RADIOLOGY WITH ANESTHESIA N/A 08/27/2019   Procedure: IR WITH ANESTHESIA;  Surgeon: Julieanne Cotton, MD;  Location: [facility] OR;  Service: Radiology;  Laterality: N/A;    Current Medications: No outpatient medications have been marked as taking for the 07/24/22 encounter (Appointment) with Glenn Barges, PA-C.    Allergies:   Patient has no known allergies.   Social History   Socioeconomic History   Marital status: Married    Spouse name: [name]   Number of children: 2   Years of education: 12   Highest education level: 12th grade  Occupational History   Occupation: [employer]  Tobacco Use   Smoking status: Former    Years: 16    Types: Cigarettes   Smokeless tobacco: Never  Vaping Use   Vaping Use: Never used  Substance and Sexual Activity   Alcohol use: No   Drug use: No   Sexual activity: Not Currently  Other Topics Concern   Not on file   Social History Narrative   Not on file   Social Determinants of Health   Financial Resource Strain: Low Risk  (10/29/2019)   Overall Financial Resource Strain (CARDIA)    Difficulty of Paying Living Expenses: Not very hard  Recent Concern: Financial Resource Strain - Medium Risk (09/08/2019)   Overall Financial Resource Strain (CARDIA)    Difficulty of Paying Living Expenses: Somewhat hard  Food Insecurity: No Food Insecurity (10/04/2021)   Hunger Vital Sign    Worried About Running Out of Food in the Last Year: Never true    Ran Out of Food in the Last Year: Never true  Transportation Needs: No Transportation Needs (10/04/2021)   PRAPARE - Administrator, Civil Service (Medical): No    Lack of Transportation (Non-Medical): No  Physical Activity: Inactive (10/29/2019)   Exercise Vital Sign    Days of Exercise per Week: 0 days    Minutes of Exercise per Session: 0 min  Stress: No Stress Concern Present (10/29/2019)   [employer] of Occupational Health - Occupational Stress Questionnaire    Feeling of Stress : Only a little  Recent Concern: Stress - Stress Concern Present (09/08/2019)  [employer] of Occupational Health - Occupational Stress Questionnaire    Feeling of Stress : To some extent  Social Connections: Moderately Integrated (10/29/2019)   Social Connection and Isolation Panel [NHANES]    Frequency of Communication with Friends and Family: More than three times a week    Frequency of Social Gatherings with Friends and Family: More than three times a week    Attends Religious Services: More than 4 times per year    Active Member of [employer] or Organizations: No    Attends Engineer, structural: Never    Marital Status: Married     Family History:  The patient's family history includes Colon cancer in his mother; Diabetes in his brother and another family member; High blood pressure in an other family member; Prostate cancer in his brother.  ROS:    12-point review of systems is negative unless otherwise noted in the HPI.   EKGs/Labs/Other Studies Reviewed:    Studies reviewed were summarized above. The additional studies were reviewed today:  2D echo 07/09/2022: 1. Left ventricular ejection fraction, by estimation, is 55 to 60%. Left  ventricular ejection fraction by 3D volume is 64 %. The left ventricle has  normal function. The left ventricle has no regional wall motion  abnormalities. There is moderate left  ventricular hypertrophy. Left ventricular diastolic parameters are  indeterminate. The average left ventricular global longitudinal strain is  -15.2 %.   2. Right ventricular systolic function is normal. The right ventricular  size is normal. There is mildly elevated pulmonary artery systolic  pressure. The estimated right ventricular systolic pressure is 40.3 mmHg.   3. Left atrial size was severely dilated.   4. The mitral valve is normal in structure. Mild mitral valve  regurgitation. No evidence of mitral stenosis.   5. Tricuspid valve regurgitation is moderate.   6. The aortic valve is tricuspid. Aortic valve regurgitation is not  visualized. Aortic valve sclerosis is present, with no evidence of aortic  valve stenosis.   7. The inferior vena cava is normal in size with greater than 50%  respiratory variability, suggesting right atrial pressure of 3 mmHg.  __________  Eugenie Birks MPI 07/08/2022:   Probably normal pharmacologic myocardial perfusion stress test without evidence of significant ischemia or scar.   Left ventricular systolic function is normal (LVEF 55%).   Leadless pacemaker noted at the RV apex.  Coronary artery calcifications are noted on the attenuation correction CT.   Elevation of the right hemidiaphragm again noted, as well as small calcifications in the visualized left kidney compatible with previously described renal calculi.   This is a low risk study.  Sensitivity and specificity are degraded by  significant extracardiac activity and diaphragmatic attenuation. __________  2D echo 11/10/2020 Gavin Potters): INTERPRETATION  NORMAL LEFT VENTRICULAR SYSTOLIC FUNCTION   WITH MILD LVH  NORMAL RIGHT VENTRICULAR SYSTOLIC FUNCTION  NO VALVULAR STENOSIS  MODERATE MR  MILD TR, PR  EF 50%  __________  Eugenie Birks MPI 03/10/2020 Gavin Potters): FINDINGS:  Regional wall motion:  reveals normal myocardial thickening and wall  motion.  The overall quality of the study is good.   Artifacts noted: no  Left ventricular cavity: normal.   Perfusion Analysis:  SPECT images demonstrate homogeneous tracer  distribution throughout the myocardium.  __________  2D echo 08/28/2019: 1. Left ventricular ejection fraction, by estimation, is 60 to 65%. The  left ventricle has normal function. The left ventricle has no regional  wall motion abnormalities. There is moderate concentric left  ventricular  hypertrophy with severe basal septal  hypertrophy. Left ventricular diastolic function could not be evaluated.   2. Right ventricular systolic function is normal. The right ventricular  size is normal. Tricuspid regurgitation signal is inadequate for assessing  PA pressure.   3. Left atrial size was mildly dilated.   4. The mitral valve is normal in structure. Trivial mitral valve  regurgitation. No evidence of mitral stenosis.   5. The aortic valve is tricuspid. Aortic valve regurgitation is not  visualized. Mild aortic valve sclerosis is present, with no evidence of  aortic valve stenosis.   6. Aortic dilatation noted. There is mild dilatation of the aortic root  measuring 39 mm.   7. The inferior vena cava is dilated in size with <50% respiratory  variability, suggesting right atrial pressure of 15 mmHg.   8. Right atrial size was mildly dilated.  __________  2D echo 12/11/2018 ([facility]. Left ventricular ejection fraction, by visual estimation, is 55 to  60%. The left ventricle has normal function.  There is borderline left  ventricular hypertrophy.   2. Global right ventricle has normal systolic function.The right  ventricular size is normal. No increase in right ventricular wall  thickness.   3. Left atrial size was mildly dilated.   4. Right atrial size was mildly dilated.   5. The mitral valve is normal in structure. Mild to moderate mitral valve  regurgitation.   6. The tricuspid valve is normal in structure. Tricuspid valve  regurgitation is mild.   7. The aortic valve is normal in structure. Aortic valve regurgitation is  not visualized.   8. The pulmonic valve was grossly normal. Pulmonic valve regurgitation is  not visualized.   9. Normal pulmonary artery systolic pressure.  ___________  2D echo 09/25/2018 Gavin Potters): INTERPRETATION  EF 40%, GLOBAL HYPOKINESIS NORMAL RIGHT VENTRICULAR SYSTOLIC FUNCTION  MODERATE VALVULAR REGURGITATION  NO VALVULAR STENOSIS  __________  2D echo 08/02/2017 Jerline Pain): - Procedure narrative: Transthoracic echocardiography. Image    quality was suboptimal. The study was technically difficult, as a    result of poor acoustic windows and poor sound wave transmission.    Intravenous contrast (Definity) was administered.  - Left ventricle: The cavity size was mildly dilated. Wall    thickness was normal. Systolic function was moderately reduced.    The estimated ejection fraction was in the range of 35% to 40%.    Regional wall motion abnormalities cannot be excluded.  - Mitral valve: There was mild regurgitation.  - Right ventricle: The cavity size was mildly dilated.   Impressions:   - Moderately reduced LVF    Globally depressed    EF=35-40%    Mild LVE    Borderline RV function.  __________  Eugenie Birks MPI 06/10/2016 Gavin Potters): LVEF= 45%   FINDINGS:  Regional wall motion:  reveals normal myocardial thickening and wall  motion.  The overall quality of the study is fair.   Artifacts noted: yes  Left ventricular cavity:  normal.   Perfusion Analysis:  SPECT images demonstrate homogeneous tracer  distribution throughout the myocardium.  __________  Stress echo 07/19/2015 Gavin Potters): INTERPRETATION  Normal Stress Echocardiogram  NORMAL RIGHT VENTRICULAR SYSTOLIC FUNCTION  MILD VALVULAR REGURGITATION (See above)  NO VALVULAR STENOSIS NOTED   EKG:  EKG is ordered today.  The EKG ordered today demonstrates ***  Recent Labs: 10/03/2021: B Natriuretic Peptide 144.8 10/04/2021: Magnesium 1.9 07/12/2022: ALT 14; BUN 38; Creatinine 1.87; Hemoglobin 15.5; Platelet Count 183; Potassium 4.8; Sodium 138  Recent Lipid Panel    Component Value Date/Time   CHOL 78 08/28/2019 0628   CHOL 97 (L) 06/11/2019 0953   CHOL 140 10/12/2013 0732   TRIG 44 08/28/2019 0628   TRIG 139 10/12/2013 0732   HDL 42 08/28/2019 0628   HDL 47 06/11/2019 0953   HDL 43 10/12/2013 0732   CHOLHDL 1.9 08/28/2019 0628   VLDL 9 08/28/2019 0628   VLDL 28 10/12/2013 0732   LDLCALC 27 08/28/2019 0628   LDLCALC 34 06/11/2019 0953   LDLCALC 69 10/12/2013 0732    PHYSICAL EXAM:    VS:  There were no vitals taken for this visit.  BMI: There is no height or weight on file to calculate BMI.  Physical Exam  Wt Readings from Last 3 Encounters:  07/17/22 234 lb (106.1 kg)  07/03/22 235 lb 6.4 oz (106.8 kg)  07/01/22 237 lb 3.2 oz (107.6 kg)     ASSESSMENT & PLAN:   CAD involving the native coronary arteries with ***:  HFpEF:  Permanent A-fib: ***.  CHA2DS2-VASc 8 (CHF, HTN, age x 2, DM, vascular disease, CVA x 2).  Reported sick sinus syndrome/symptomatic bradycardia: Status post leadless pacemaker by outside office in 11/2020.  Infrarenal AAA/bilateral common iliac artery aneurysm:  Ischemic left lower extremity:  CKD stage III-IV:  HTN: Blood pressure  HLD: LDL 27 in 08/2019.   {Are you ordering a CV Procedure (e.g. stress test, cath, DCCV, TEE, etc)?   Press F2        :[id number]}     Disposition: F/u with Dr.  Azucena Werner or an APP in ***.   Medication Adjustments/Labs and Tests Ordered: Current medicines are reviewed at length with the patient today.  Concerns regarding medicines are outlined above. Medication changes, Labs and Tests ordered today are summarized above and listed in the Patient Instructions accessible in Encounters.   Signed, Eula Listen, PA-C 07/22/2022 10:41 AM     [facility] [place] [address] [place], [place] [address] [phone]

## 2022-07-23 ENCOUNTER — Other Ambulatory Visit: Payer: Self-pay

## 2022-07-23 ENCOUNTER — Observation Stay
Admission: EM | Admit: 2022-07-23 | Discharge: 2022-07-24 | Disposition: A | Discharge status: Home / Self Care | Payer: Medicare Other | Attending: Student | Admitting: Student

## 2022-07-23 ENCOUNTER — Observation Stay: Payer: Medicare Other

## 2022-07-23 ENCOUNTER — Encounter: Payer: Self-pay | Admitting: Internal Medicine

## 2022-07-23 ENCOUNTER — Telehealth: Payer: Self-pay | Admitting: Cardiology

## 2022-07-23 DIAGNOSIS — I480 Paroxysmal atrial fibrillation: Secondary | ICD-10-CM | POA: Diagnosis present

## 2022-07-23 DIAGNOSIS — G4733 Obstructive sleep apnea (adult) (pediatric): Secondary | ICD-10-CM | POA: Diagnosis present

## 2022-07-23 DIAGNOSIS — K625 Hemorrhage of anus and rectum: Principal | ICD-10-CM

## 2022-07-23 DIAGNOSIS — J449 Chronic obstructive pulmonary disease, unspecified: Secondary | ICD-10-CM | POA: Diagnosis not present

## 2022-07-23 DIAGNOSIS — I4891 Unspecified atrial fibrillation: Secondary | ICD-10-CM | POA: Diagnosis not present

## 2022-07-23 DIAGNOSIS — Z853 Personal history of malignant neoplasm of breast: Secondary | ICD-10-CM | POA: Diagnosis not present

## 2022-07-23 DIAGNOSIS — C61 Malignant neoplasm of prostate: Secondary | ICD-10-CM | POA: Diagnosis present

## 2022-07-23 DIAGNOSIS — I10 Essential (primary) hypertension: Secondary | ICD-10-CM | POA: Diagnosis not present

## 2022-07-23 DIAGNOSIS — Z87891 Personal history of nicotine dependence: Secondary | ICD-10-CM | POA: Insufficient documentation

## 2022-07-23 DIAGNOSIS — I4811 Longstanding persistent atrial fibrillation: Secondary | ICD-10-CM | POA: Diagnosis not present

## 2022-07-23 DIAGNOSIS — Z86718 Personal history of other venous thrombosis and embolism: Secondary | ICD-10-CM | POA: Diagnosis not present

## 2022-07-23 DIAGNOSIS — Z8673 Personal history of transient ischemic attack (TIA), and cerebral infarction without residual deficits: Secondary | ICD-10-CM | POA: Diagnosis not present

## 2022-07-23 DIAGNOSIS — I130 Hypertensive heart and chronic kidney disease with heart failure and stage 1 through stage 4 chronic kidney disease, or unspecified chronic kidney disease: Secondary | ICD-10-CM | POA: Diagnosis not present

## 2022-07-23 DIAGNOSIS — I5022 Chronic systolic (congestive) heart failure: Secondary | ICD-10-CM | POA: Diagnosis not present

## 2022-07-23 DIAGNOSIS — E1122 Type 2 diabetes mellitus with diabetic chronic kidney disease: Secondary | ICD-10-CM | POA: Diagnosis not present

## 2022-07-23 DIAGNOSIS — Z7901 Long term (current) use of anticoagulants: Secondary | ICD-10-CM | POA: Insufficient documentation

## 2022-07-23 DIAGNOSIS — N200 Calculus of kidney: Secondary | ICD-10-CM | POA: Diagnosis not present

## 2022-07-23 DIAGNOSIS — N1832 Chronic kidney disease, stage 3b: Secondary | ICD-10-CM | POA: Diagnosis not present

## 2022-07-23 DIAGNOSIS — N184 Chronic kidney disease, stage 4 (severe): Secondary | ICD-10-CM | POA: Diagnosis not present

## 2022-07-23 DIAGNOSIS — E1129 Type 2 diabetes mellitus with other diabetic kidney complication: Secondary | ICD-10-CM | POA: Diagnosis present

## 2022-07-23 DIAGNOSIS — Z794 Long term (current) use of insulin: Secondary | ICD-10-CM | POA: Diagnosis not present

## 2022-07-23 DIAGNOSIS — Z79899 Other long term (current) drug therapy: Secondary | ICD-10-CM | POA: Diagnosis not present

## 2022-07-23 DIAGNOSIS — K5900 Constipation, unspecified: Secondary | ICD-10-CM | POA: Diagnosis not present

## 2022-07-23 DIAGNOSIS — K5730 Diverticulosis of large intestine without perforation or abscess without bleeding: Secondary | ICD-10-CM | POA: Diagnosis not present

## 2022-07-23 LAB — GLUCOSE, CAPILLARY
Glucose-Capillary: 146 mg/dL — ABNORMAL HIGH (ref 70–99)
Glucose-Capillary: 159 mg/dL — ABNORMAL HIGH (ref 70–99)

## 2022-07-23 LAB — COMPREHENSIVE METABOLIC PANEL
ALT: 12 U/L (ref 0–44)
AST: 17 U/L (ref 15–41)
Albumin: 3.6 g/dL (ref 3.5–5.0)
Alkaline Phosphatase: 149 U/L — ABNORMAL HIGH (ref 38–126)
Anion gap: 9 (ref 5–15)
BUN: 37 mg/dL — ABNORMAL HIGH (ref 8–23)
CO2: 23 mmol/L (ref 22–32)
Calcium: 9.2 mg/dL (ref 8.9–10.3)
Chloride: 104 mmol/L (ref 98–111)
Creatinine, Ser: 1.83 mg/dL — ABNORMAL HIGH (ref 0.61–1.24)
GFR, Estimated: 35 mL/min — ABNORMAL LOW (ref >60)
Glucose, Bld: 189 mg/dL — ABNORMAL HIGH (ref 70–99)
Potassium: 4.2 mmol/L (ref 3.5–5.1)
Sodium: 136 mmol/L (ref 135–145)
Total Bilirubin: 0.6 mg/dL (ref 0.3–1.2)
Total Protein: 7.4 g/dL (ref 6.5–8.1)

## 2022-07-23 LAB — CBC
HCT: 47.3 % (ref 39.0–52.0)
HCT: 47.7 % (ref 39.0–52.0)
Hemoglobin: 15.6 g/dL (ref 13.0–17.0)
Hemoglobin: 15.5 g/dL (ref 13.0–17.0)
MCH: 30.9 pg (ref 26.0–34.0)
MCH: 30.6 pg (ref 26.0–34.0)
MCHC: 33 g/dL (ref 30.0–36.0)
MCHC: 32.5 g/dL (ref 30.0–36.0)
MCV: 93.7 fL (ref 80.0–100.0)
MCV: 94.3 fL (ref 80.0–100.0)
Platelets: 195 10*3/uL (ref 150–400)
Platelets: 191 10*3/uL (ref 150–400)
RBC: 5.05 MIL/uL (ref 4.22–5.81)
RBC: 5.06 MIL/uL (ref 4.22–5.81)
RDW: 15.1 % (ref 11.5–15.5)
RDW: 14.9 % (ref 11.5–15.5)
WBC: 5.7 10*3/uL (ref 4.0–10.5)
WBC: 6.1 10*3/uL (ref 4.0–10.5)
nRBC: 0 % (ref 0.0–0.2)
nRBC: 0 % (ref 0.0–0.2)

## 2022-07-23 LAB — BRAIN NATRIURETIC PEPTIDE: B Natriuretic Peptide: 233.3 pg/mL — ABNORMAL HIGH (ref 0.0–100.0)

## 2022-07-23 MED ORDER — INSULIN ASPART 100 UNIT/ML IJ SOLN
0.0000 [IU] | Freq: Three times a day (TID) | INTRAMUSCULAR | Status: DC
  Administered 2022-07-23: 1 [IU] via SUBCUTANEOUS
  Administered 2022-07-24: 2 [IU] via SUBCUTANEOUS
  Administered 2022-07-24: 1 [IU] via SUBCUTANEOUS
  Filled 2022-07-23 (×3): qty 1

## 2022-07-23 MED ORDER — METOPROLOL TARTRATE 25 MG PO TABS
37.5000 mg | ORAL_TABLET | Freq: Two times a day (BID) | ORAL | Status: DC
  Administered 2022-07-23 – 2022-07-24 (×2): 37.5 mg via ORAL
  Filled 2022-07-23 (×2): qty 2

## 2022-07-23 MED ORDER — SODIUM CHLORIDE 0.9% FLUSH
3.0000 mL | Freq: Two times a day (BID) | INTRAVENOUS | Status: DC
  Administered 2022-07-23 – 2022-07-24 (×3): 3 mL via INTRAVENOUS

## 2022-07-23 MED ORDER — PANTOPRAZOLE SODIUM 40 MG PO TBEC
40.0000 mg | DELAYED_RELEASE_TABLET | Freq: Every day | ORAL | Status: DC
  Administered 2022-07-24: 40 mg via ORAL
  Filled 2022-07-23: qty 1

## 2022-07-23 MED ORDER — SENNOSIDES-DOCUSATE SODIUM 8.6-50 MG PO TABS
1.0000 | ORAL_TABLET | Freq: Every day | ORAL | Status: DC
  Administered 2022-07-23: 1 via ORAL
  Filled 2022-07-23: qty 1

## 2022-07-23 MED ORDER — ATORVASTATIN CALCIUM 20 MG PO TABS
10.0000 mg | ORAL_TABLET | Freq: Every day | ORAL | Status: DC
  Administered 2022-07-23: 10 mg via ORAL
  Filled 2022-07-23: qty 1

## 2022-07-23 MED ORDER — ENZALUTAMIDE 40 MG PO TABS: 160.0000 mg | ORAL_TABLET | Freq: Every day | ORAL | Status: DC

## 2022-07-23 MED ORDER — FUROSEMIDE 20 MG PO TABS
20.0000 mg | ORAL_TABLET | Freq: Every day | ORAL | Status: DC
  Administered 2022-07-23 – 2022-07-24 (×2): 20 mg via ORAL
  Filled 2022-07-23 (×2): qty 1

## 2022-07-23 MED ORDER — ALBUTEROL SULFATE (2.5 MG/3ML) 0.083% IN NEBU: 2.5000 mg | INHALATION_SOLUTION | RESPIRATORY_TRACT | Status: DC | PRN

## 2022-07-23 MED ORDER — FERROUS SULFATE 325 (65 FE) MG PO TABS
325.0000 mg | ORAL_TABLET | Freq: Every day | ORAL | Status: DC
  Administered 2022-07-24: 325 mg via ORAL
  Filled 2022-07-23: qty 1

## 2022-07-23 MED ORDER — TIOTROPIUM BROMIDE MONOHYDRATE 18 MCG IN CAPS
18.0000 ug | ORAL_CAPSULE | Freq: Every day | RESPIRATORY_TRACT | Status: DC
  Administered 2022-07-24: 18 ug via RESPIRATORY_TRACT
  Filled 2022-07-23: qty 5

## 2022-07-23 MED ORDER — POLYETHYLENE GLYCOL 3350 17 G PO PACK
17.0000 g | PACK | Freq: Every day | ORAL | Status: DC
  Administered 2022-07-23: 17 g via ORAL
  Filled 2022-07-23: qty 1

## 2022-07-23 MED ORDER — ONDANSETRON HCL 4 MG PO TABS: 4.0000 mg | ORAL_TABLET | Freq: Four times a day (QID) | ORAL | Status: DC | PRN

## 2022-07-23 MED ORDER — ACETAMINOPHEN 650 MG RE SUPP: 650.0000 mg | Freq: Four times a day (QID) | RECTAL | Status: DC | PRN

## 2022-07-23 MED ORDER — ONDANSETRON HCL 4 MG/2ML IJ SOLN: 4.0000 mg | Freq: Four times a day (QID) | INTRAMUSCULAR | Status: DC | PRN

## 2022-07-23 MED ORDER — ACETAMINOPHEN 325 MG PO TABS
650.0000 mg | ORAL_TABLET | Freq: Four times a day (QID) | ORAL | Status: DC | PRN
  Administered 2022-07-24: 650 mg via ORAL
  Filled 2022-07-23: qty 2

## 2022-07-23 NOTE — Assessment & Plan Note (Signed)
-   Resume home antihypertensives tomorrow 

## 2022-07-23 NOTE — ED Provider Notes (Signed)
[facility] Provider Note    Event Date/Time   First MD Initiated Contact with Patient 07/23/22 1135     (approximate)   History   Rectal Bleeding   HPI  [name] is a 89 y.o. male   Past medical history of diabetes, hypertension, kidney stones, congestive heart failure, atrial fibrillation, stroke, DVT on edoxaban, hyperlipidemia, CKD presents emergency department with rectal bleeding.  He has been constipated and pushing really hard to make stools.  3 days ago he had some scant bright red blood per rectum.  Over the last 2 days it has gotten significantly more.  Today had a large amount of bleeding.  He has some pain in his rectum.  He had a similar issue in 2021 when he went to [facility] for rectal pain rectal bleeding and had a hemoglobin drop from 15-10.  He had a colonoscopy there noting mucosal ulceration of the rectum.   External Medical Documents Reviewed: Colonoscopy from 2021 noted rectal mucosal ulceration and bright red blood on rectal exam at that time with hospitalization note discharge summary from 08/31/2019 noted recurrent rectal bleeding, that note refers to a hospitalization from June 2021 when he was admitted to [facility] for rectal bleeding with a hemoglobin drop from 15-10., upper endoscopy from March 2024 which was normal      Physical Exam   Triage Vital Signs: ED Triage Vitals  Enc Vitals Group     BP 07/23/22 1131 (!) 127/90     Pulse Rate 07/23/22 1130 74     Resp 07/23/22 1130 18     Temp 07/23/22 1130 97.6 F (36.4 C)     Temp Source 07/23/22 1130 Oral     SpO2 07/23/22 1130 99 %     Weight 07/23/22 1130 233 lb 14.5 oz (106.1 kg)     Height 07/23/22 1130 6' (1.829 m)     Head Circumference --      Peak Flow --      Pain Score 07/23/22 1130 9     Pain Loc --      Pain Edu? --      Excl. in GC? --     Most recent vital signs: Vitals:   07/23/22 1130 07/23/22 1131  BP:  (!) 127/90  Pulse: 74   Resp: 18   Temp: 97.6  F (36.4 C)   SpO2: 99%     General: Awake, no distress.  CV:  Good peripheral perfusion.  Resp:  Normal effort.  Abd:  No distention.  Other:  Overall well-appearing nontoxic, comfortable with normal vital signs.  Soft nontender abdomen to deep palpation in all quadrants.  Rectal exam shows soft brown stool with bright red blood marbled throughout   ED Results / Procedures / Treatments   Labs (all labs ordered are listed, but only abnormal results are displayed) Labs Reviewed  COMPREHENSIVE METABOLIC PANEL - Abnormal; Notable for the following components:      Result Value   Glucose, Bld 189 (*)    BUN 37 (*)    Creatinine, Ser 1.83 (*)    Alkaline Phosphatase 149 (*)    GFR, Estimated 35 (*)    All other components within normal limits  CBC     I ordered and reviewed the above labs they are notable for hemoglobin today is 15, consistent with baseline    PROCEDURES:  Critical Care performed: No  Procedures   MEDICATIONS ORDERED IN ED: Medications - No data  to display  External physician / consultants:  I spoke with hospitalist for admission regarding care plan for this patient.   IMPRESSION / MDM / ASSESSMENT AND PLAN / ED COURSE  I reviewed the triage vital signs and the nursing notes.                                Patient's presentation is most consistent with acute presentation with potential threat to life or bodily function.  Differential diagnosis includes, but is not limited to, constipation, fecal impaction, rectal bleeding, lower GI bleeding, acute blood loss anemia, hemorrhoids, proctitis, prostatitis   The patient is on the cardiac monitor to evaluate for evidence of arrhythmia and/or significant heart rate changes.  MDM: This is a patient with constipation and rectal bleeding bright red blood with a history of rectal bleeding and rectal mucosal ulceration with significant blood loss in the past, feels similar to that.  Fortunately his  hemoglobin looks good today and his vital signs are within normal limits however his first episode of large bleeding was just earlier today and has been progressively worsening and the patient is anticoagulated.  Given his worsening rectal bleeding and history of rectal mucosal ulceration with a significant drop in H&H, anticoagulated I think he is high risk for acute blood loss anemia with ongoing rectal bleeding, so will admit for monitoring, repeat H&H and consideration of GI consultation        FINAL CLINICAL IMPRESSION(S) / ED DIAGNOSES   Final diagnoses:  Rectal bleeding     Rx / DC Orders   ED Discharge Orders     None        Note:  This document was prepared using Dragon voice recognition software and may include unintentional dictation errors.    Pilar Jarvis, MD 07/23/22 [phone]

## 2022-07-23 NOTE — Assessment & Plan Note (Signed)
-  Continue home bronchodilators 

## 2022-07-23 NOTE — Assessment & Plan Note (Signed)
Patient presenting with 3-day history of bright red blood per rectum with stable hemoglobin.  Potentially related to constipation.  Similar presentation in 2021 during which patient had a 5 g drop in hemoglobin, at which time colonoscopy demonstrated Bleeding ulcerated mucosa in the distal rectum, which was injected with epinephrine for hemostasis.  - Will obtain CT of the abdomen to rule out stercoral colitis - CBC twice daily - Will consult GI if there are any decreases in hemoglobin or bleeding does not improve - Hold home anticoagulation for now

## 2022-07-23 NOTE — H&P (Signed)
History and Physical    Patient: [name]:[id number] DOB: 12-04-1933 DOA: 07/23/2022 DOS: the patient was seen and examined on 07/23/2022 PCP: [name], NP  Patient coming from: Home  Chief Complaint:  Chief Complaint  Patient presents with   Rectal Bleeding   HPI: [name] is a 89 y.o. male with medical history significant of atrial fibrillation on edoxaban, CHF, stage IVb prostate cancer on Xtandi, hypertension, type 2 diabetes, previous DVT, OSA, CVA, who presents to the ED due to rectal bleeding.  [name] states that for the last 1 month approximately, he has been constipated.  He often has a sensation that he needs to use the restroom, but when he tries, only small stool shaped as marbles come out.  His wife at bedside states that he usually sits on the commode for up to 30 minutes trying to have a bowel movement each time.  Then approximately 3 days ago, he began to see small bright red blood per rectum on the stool.  The rectal bleeding continue to progress in quantity and today, he had a larger quantity episode.  He denies any other symptoms including dizziness, shortness of breath, chest pain, dizziness, nausea, vomiting.  He notes that occasionally he does have lower abdominal pain that is exacerbated by coughing.  ED course: On arrival to the ED, patient was normotensive at 127/90 with heart rate of 74.  He was saturating at 99% on room air.  He was afebrile 97.6.  Initial workup demonstrates stable hemoglobin of 15.6, glucose of 189, BUN 37, creatinine 1.83 with GFR 35, alkaline phosphatase 149.  Due to previous severe rectal bleeding, [facility] contacted for observation.  Review of Systems: As mentioned in the history of present illness. All other systems reviewed and are negative.  Past Medical History:  Diagnosis Date   Acute deep vein thrombosis (DVT) of femoral vein of left lower extremity (HCC)    Atrial fibrillation (HCC)    CHF (congestive heart  failure) (HCC)    Chronic kidney disease    Diabetes (HCC)    Hearing loss    High blood pressure    History of bladder problems    Hyperlipidemia    Kidney stones 11/22/2014   Liver cyst    OSA (obstructive sleep apnea)    Prostate cancer (HCC)    Prostate cancer (HCC)    Stroke (HCC)    Valvular heart disease    Past Surgical History:  Procedure Laterality Date   CATARACT EXTRACTION     COLONOSCOPY WITH PROPOFOL N/A 07/02/2019   Procedure: COLONOSCOPY WITH PROPOFOL;  Surgeon: Toney Reil, MD;  Location: [facility] ENDOSCOPY;  Service: Gastroenterology;  Laterality: N/A;   ESOPHAGOGASTRODUODENOSCOPY (EGD) WITH PROPOFOL N/A 03/19/2022   Procedure: ESOPHAGOGASTRODUODENOSCOPY (EGD) WITH PROPOFOL;  Surgeon: Wyline Mood, MD;  Location[facility] ENDOSCOPY;  Service: Gastroenterology;  Laterality: N/A;   IR CT HEAD LTD  08/27/2019   IR PERCUTANEOUS ART THROMBECTOMY/INFUSION INTRACRANIAL INC DIAG ANGIO  08/27/2019       IR PERCUTANEOUS ART THROMBECTOMY/INFUSION INTRACRANIAL INC DIAG ANGIO  08/27/2019   LOWER EXTREMITY ANGIOGRAPHY Left 12/25/2020   Procedure: Lower Extremity Angiography;  Surgeon: Annice Needy, MD;  Location: [facility] INVASIVE CV LAB;  Service: Cardiovascular;  Laterality: Left;   PACEMAKER LEADLESS INSERTION N/A 12/19/2020   Procedure: PACEMAKER LEADLESS INSERTION;  Surgeon: Marcina Millard, MD;  Location: [facility] INVASIVE CV LAB;  Service: Cardiovascular;  Laterality: N/A;   PROSTATE CANCER     PROSTATE SURGERY  RADIOLOGY WITH ANESTHESIA N/A 08/27/2019   Procedure: IR WITH ANESTHESIA;  Surgeon: Julieanne Cotton, MD;  Location: [facility] OR;  Service: Radiology;  Laterality: N/A;   Social History:  reports that he has quit smoking. His smoking use included cigarettes. He has never used smokeless tobacco. He reports that he does not drink alcohol and does not use drugs.  No Known Allergies  Family History  Problem Relation Age of Onset   Colon cancer Mother    Diabetes Other     High blood pressure Other    Prostate cancer Brother    Diabetes Brother     Prior to Admission medications   Medication Sig Start Date End Date Taking? Authorizing Provider  albuterol (VENTOLIN HFA) 108 (90 Base) MCG/ACT inhaler Inhale 2 puffs into the lungs every 4 (four) hours as needed for wheezing or shortness of breath. 03/16/21   [name], NP  atorvastatin (LIPITOR) 10 MG tablet TAKE 1 TABLET(10 MG) BY MOUTH DAILY 12/20/21   [name], NP  benzonatate (TESSALON PERLES) 100 MG capsule Take 1 capsule (100 mg total) by mouth 3 (three) times daily as needed for cough. 11/13/21 11/22/24  Varney Daily, PA  colchicine 0.6 MG tablet TAKE 1 TABLET BY MOUTH TWICE DAILY FOR ANKLE PAIN Patient not taking: Reported on 07/03/2022 05/20/22   Lyndon Code, MD  dapagliflozin propanediol (FARXIGA) 10 MG TABS tablet TAKE 1 TABLET(10 MG) BY MOUTH DAILY BEFORE BREAKFAST 04/09/22   [name], NP  edoxaban (SAVAYSA) 30 MG TABS tablet Take by mouth. 11/01/21   [provider]  enzalutamide [name]) 40 MG tablet Take 4 tablets (160 mg total) by mouth daily. 07/17/22   [name], MD  ferrous sulfate 325 (65 FE) MG tablet Take 1 tablet (325 mg total) by mouth daily. 03/07/22   Lyndon Code, MD  furosemide (LASIX) 20 MG tablet Take 1 tablet (20 mg total) by mouth daily. 07/03/22 10/01/22  Debbe Odea, MD  glucose blood (ONETOUCH VERIO) test strip USE ONCE DAILY 02/04/22   [name], NP  metoprolol tartrate 37.5 MG TABS Take 37.5 mg by mouth 2 (two) times daily. 10/06/21   Alford Highland, MD  pantoprazole (PROTONIX) 40 MG tablet Take 1 tablet (40 mg total) by mouth daily. 06/27/22 06/27/23  Minna Antis, MD  SPIRIVA RESPIMAT 2.5 MCG/ACT AERS INHALE 2 PUFFS INTO THE LUNGS DAILY 06/19/22   [name], NP  sucralfate (CARAFATE) 1 g tablet Take 1 tablet (1 g total) by mouth 4 (four) times daily. 06/27/22 06/27/23  Minna Antis, MD  tamsulosin (FLOMAX) 0.4 MG  CAPS capsule Take 0.4 mg by mouth daily after breakfast. 10/06/21   [provider]  vitamin B-12 (CYANOCOBALAMIN) 1000 MCG tablet Take 1 tablet (1,000 mcg total) by mouth daily. 12/08/20   [name], NP    Physical Exam: Vitals:   07/23/22 1130 07/23/22 1131 07/23/22 1230  BP:  (!) 127/90 (!) 144/97  Pulse: 74  70  Resp: 18    Temp: 97.6 F (36.4 C)    TempSrc: Oral    SpO2: 99%  94%  Weight: 106.1 kg    Height: 6' (1.829 m)     Physical Exam Vitals and nursing note reviewed.  Constitutional:      General: He is not in acute distress.    Appearance: He is normal weight.  HENT:     Head: Normocephalic and atraumatic.     Mouth/Throat:     Mouth: Mucous membranes are moist.  Pharynx: Oropharynx is clear.  Eyes:     Conjunctiva/sclera: Conjunctivae normal.     Pupils: Pupils are equal, round, and reactive to light.  Cardiovascular:     Rate and Rhythm: Normal rate and regular rhythm.     Heart sounds: No murmur heard. Pulmonary:     Effort: Pulmonary effort is normal. No respiratory distress.     Breath sounds: Rales (Up to mid lung fields) present. No wheezing or rhonchi.  Abdominal:     General: Bowel sounds are normal. There is no distension.     Palpations: Abdomen is soft.     Tenderness: There is no abdominal tenderness. There is no guarding.  Musculoskeletal:     Right lower leg: 1+ Pitting Edema present.     Left lower leg: 1+ Pitting Edema present.  Skin:    General: Skin is warm and dry.  Neurological:     General: No focal deficit present.     Mental Status: He is alert and oriented to person, place, and time. Mental status is at baseline.  Psychiatric:        Mood and Affect: Mood normal.        Behavior: Behavior normal.    Data Reviewed: CBC with WBC of 5.7, hemoglobin 15.6, platelets 195 CMP with sodium of 136, potassium 4.2, bicarb 23, glucose 189, BUN 37, creatinine 1.83, alkaline phosphatase 149, AST 17, ALT 12, GFR  35  Results are pending, will review when available.  Assessment and Plan:  * Rectal bleeding Patient presenting with 3-day history of bright red blood per rectum with stable hemoglobin.  Potentially related to constipation.  Similar presentation in 2021 during which patient had a 5 g drop in hemoglobin, at which time colonoscopy demonstrated Bleeding ulcerated mucosa in the distal rectum, which was injected with epinephrine for hemostasis.  - Will obtain CT of the abdomen to rule out stercoral colitis - CBC twice daily - Will consult GI if there are any decreases in hemoglobin or bleeding does not improve - Hold home anticoagulation for now  Atrial fibrillation (HCC) - Continue home metoprolol - Hold home edoxaban  Chronic systolic heart failure (HCC) Patient is hypervolemic on examination, however he is asymptomatic.  He is unsure if he has been taking his home Lasix.  - BNP - Resume home Lasix  - Continue home GDMT  Chronic kidney disease, stage 3b (HCC) Renal function currently stable.  Will continue to monitor while admitted  - BMP in the a.m.  Type II diabetes mellitus with renal manifestations (HCC) - Hold home Farxiga - SSI, sensitive  Prostate cancer (HCC) History of stage IVb prostate cancer currently on Xtandi.  No suspicion at this time to be contributing to current presentation.  COPD (chronic obstructive pulmonary disease) (HCC) - Continue home bronchodilators  OSA (obstructive sleep apnea) - CPAP at bedtime  Essential hypertension - Resume home antihypertensives tomorrow  Advance Care Planning:   Code Status: Full Code verified by patient with wife at bedside  Consults: None  Family Communication: Patient's wife updated at bedside  Severity of Illness: The appropriate patient status for this patient is OBSERVATION. Observation status is judged to be reasonable and necessary in order to provide the required intensity of service to ensure the  patient's safety. The patient's presenting symptoms, physical exam findings, and initial radiographic and laboratory data in the context of their medical condition is felt to place them at decreased risk for further clinical deterioration. Furthermore, it is anticipated that  the patient will be medically stable for discharge from the hospital within 2 midnights of admission.   Author: Verdene Lennert, MD 07/23/2022 1:11 PM  For on call review [email or web address].

## 2022-07-23 NOTE — Assessment & Plan Note (Signed)
-   Hold home Farxiga - SSI, sensitive 

## 2022-07-23 NOTE — Assessment & Plan Note (Addendum)
Patient is hypervolemic on examination, however he is asymptomatic.  He is unsure if he has been taking his home Lasix.  - BNP - Resume home Lasix  - Continue home GDMT

## 2022-07-23 NOTE — Assessment & Plan Note (Signed)
Renal function currently stable.  Will continue to monitor while admitted  - BMP in the a.m.

## 2022-07-23 NOTE — Assessment & Plan Note (Signed)
-   CPAP at bedtime 

## 2022-07-23 NOTE — Assessment & Plan Note (Signed)
-   Continue home metoprolol - Hold home edoxaban

## 2022-07-23 NOTE — ED Triage Notes (Signed)
Pt here with rectal bleeding. Pt states it is mostly blood when he does use the bathroom. Pt denies abd pain. Pt does not take blood thinners.

## 2022-07-23 NOTE — Telephone Encounter (Signed)
Caller reported patient has been admitted to [facility], Rm 1 C.

## 2022-07-23 NOTE — Assessment & Plan Note (Signed)
History of stage IVb prostate cancer currently on Xtandi.  No suspicion at this time to be contributing to current presentation.

## 2022-07-24 ENCOUNTER — Ambulatory Visit: Payer: Medicare Other | Admitting: Physician Assistant

## 2022-07-24 DIAGNOSIS — K625 Hemorrhage of anus and rectum: Secondary | ICD-10-CM | POA: Diagnosis not present

## 2022-07-24 LAB — BASIC METABOLIC PANEL
Anion gap: 8 (ref 5–15)
BUN: 30 mg/dL — ABNORMAL HIGH (ref 8–23)
CO2: 26 mmol/L (ref 22–32)
Calcium: 9.1 mg/dL (ref 8.9–10.3)
Chloride: 103 mmol/L (ref 98–111)
Creatinine, Ser: 1.59 mg/dL — ABNORMAL HIGH (ref 0.61–1.24)
GFR, Estimated: 42 mL/min — ABNORMAL LOW (ref >60)
Glucose, Bld: 150 mg/dL — ABNORMAL HIGH (ref 70–99)
Potassium: 3.6 mmol/L (ref 3.5–5.1)
Sodium: 137 mmol/L (ref 135–145)

## 2022-07-24 LAB — CBC
HCT: 48.9 % (ref 39.0–52.0)
Hemoglobin: 15.8 g/dL (ref 13.0–17.0)
MCH: 30.6 pg (ref 26.0–34.0)
MCHC: 32.3 g/dL (ref 30.0–36.0)
MCV: 94.8 fL (ref 80.0–100.0)
Platelets: 179 10*3/uL (ref 150–400)
RBC: 5.16 MIL/uL (ref 4.22–5.81)
RDW: 14.7 % (ref 11.5–15.5)
WBC: 6.6 10*3/uL (ref 4.0–10.5)
nRBC: 0 % (ref 0.0–0.2)

## 2022-07-24 LAB — GLUCOSE, CAPILLARY
Glucose-Capillary: 150 mg/dL — ABNORMAL HIGH (ref 70–99)
Glucose-Capillary: 161 mg/dL — ABNORMAL HIGH (ref 70–99)
Glucose-Capillary: 93 mg/dL (ref 70–99)

## 2022-07-24 MED ORDER — WITCH HAZEL-GLYCERIN EX PADS
MEDICATED_PAD | CUTANEOUS | Status: DC | PRN
  Filled 2022-07-24: qty 100

## 2022-07-24 MED ORDER — SORBITOL 70 % SOLN: 960.0000 mL | TOPICAL_OIL | Freq: Once | ORAL | Status: DC | PRN

## 2022-07-24 MED ORDER — BISACODYL 10 MG RE SUPP
10.0000 mg | Freq: Every day | RECTAL | Status: DC | PRN
  Administered 2022-07-24: 10 mg via RECTAL
  Filled 2022-07-24: qty 1

## 2022-07-24 MED ORDER — POLYETHYLENE GLYCOL 3350 17 G PO PACK: 17.0000 g | PACK | Freq: Two times a day (BID) | ORAL | 2 refills | Status: AC

## 2022-07-24 MED ORDER — BISACODYL 5 MG PO TBEC: 10.0000 mg | DELAYED_RELEASE_TABLET | Freq: Every day | ORAL | Status: DC

## 2022-07-24 MED ORDER — POLYETHYLENE GLYCOL 3350 17 G PO PACK
17.0000 g | PACK | Freq: Two times a day (BID) | ORAL | Status: DC
  Administered 2022-07-24: 17 g via ORAL
  Filled 2022-07-24: qty 1

## 2022-07-24 MED ORDER — BISACODYL 5 MG PO TBEC: 10.0000 mg | DELAYED_RELEASE_TABLET | Freq: Every day | ORAL | 2 refills | Status: DC

## 2022-07-24 NOTE — Discharge Summary (Signed)
[facility] Discharge Summary   Patient: [name] WGN:[id number]  PCP: [name], NP  Date of admission: 07/23/2022   Date of discharge:  07/24/2022     Discharge Diagnoses:  Principal Problem:   Rectal bleeding Active Problems:   Atrial fibrillation (HCC)   Chronic systolic heart failure (HCC)   Chronic kidney disease, stage 3b (HCC)   Essential hypertension   OSA (obstructive sleep apnea)   COPD (chronic obstructive pulmonary disease) (HCC)   Prostate cancer (HCC)   Type II diabetes mellitus with renal manifestations (HCC)   Admitted From: Home Disposition:  Home   Recommendations for Outpatient Follow-up:  PCP: in1 wk Follow up LABS/TEST:     Diet recommendation: Cardiac and Carb modified diet  Activity: The patient is advised to gradually reintroduce usual activities, as tolerated  Discharge Condition: stable  Code Status: Full code   History of present illness: As per the H and P dictated on admission Hospital Course:  [name] is a 89 y.o. male with medical history significant of atrial fibrillation on edoxaban, CHF, stage IVb prostate cancer on Xtandi, hypertension, type 2 diabetes, previous DVT, OSA, CVA, who presents to the ED due to rectal bleeding.  Patient has severe constipation, having rectal bleeding with BM each time, it got worse now. No frank GI bleeding noticed.  Patient denies any other complaints. ED workup:  VS stable, Hb  15.5, creatinine 1.83 elevated hyperglycemia glucose 189, BNP 233 slightly elevated CT a/p: IMPRESSION: 1. Mild mural thickening of the rectum, which may be due to underdistention or proctitis. 2. Colonic diverticulosis without acute diverticulitis. 3. Bilateral nonobstructing nephrolithiasis. 4.  Aortic Atherosclerosis (ICD10-I70.0).   Assessment and Plan: # Proctitis secondary to constipation and rectal bleeding H&H stable, no active bleeding noticed. Patient still has constipation, started MiraLAX,  Dulcolax S/p Dulcolax suppository.  Enema order placed for prn but patient did not require it.  Patient had multiple BMs after Dulcolax suppository.  Patient was feeling better.  Denies any lower GI bleeding.  Patient was discharged on MiraLAX and Dulcolax for home use.  Advised to follow with PCP and GI if persistent bleeding. # Atrial fibrillation: Continue home metoprolol and Resumed home edoxaban # Chronic systolic heart failure: Patient is hypervolemic on examination, however he is asymptomatic.  He is unsure if he has been taking his home Lasix. Resume home Lasix, Continue home GDMT and f/u with Cardio # Chronic kidney disease, stage 3b: Creatinine 1.83--1.59, Gradually improving. F/u with PCP # Type II diabetes mellitus with renal manifestations: continued home [place] # Prostate cancer: History of stage IVb prostate cancer currently on Xtandi.  No suspicion at this time to be contributing to current presentation.  Follow-up with urologist and oncologist as an outpatient # COPD (chronic obstructive pulmonary disease) Continue home bronchodilators # OSA (obstructive sleep apnea) CPAP at bedtime # Essential hypertension: Resume home antihypertensives.  Monitor BP at home and follow with PCP.  Body mass index is 31.33 kg/m.  Nutrition Interventions:  Patient was ambulatory without any assistance. On the day of the discharge the patient's vitals were stable, and no other acute medical condition were reported by patient. the patient was felt safe to be discharge at Home.  Consultants: None Procedures: None  Discharge Exam: General: Appear in no distress, no Rash; Oral Mucosa Clear, moist. Cardiovascular: S1 and S2 Present, no Murmur, Respiratory: normal respiratory effort, Bilateral Air entry present and no Crackles, no wheezes Abdomen: Bowel Sound present, Soft and no tenderness,  no hernia Extremities: no Pedal edema, no calf tenderness Neurology: alert and oriented to time, place, and  person affect appropriate.  Filed Weights   07/23/22 1130 07/24/22 0428  Weight: 106.1 kg 104.8 kg   Vitals:   07/24/22 0717 07/24/22 1553  BP: 116/82 94/65  Pulse: 68 69  Resp: 18 19  Temp: 98.3 F (36.8 C) 98.1 F (36.7 C)  SpO2: 92% 96%    DISCHARGE MEDICATION: Allergies as of 07/24/2022   No Known Allergies      Medication List     STOP taking these medications    benzonatate 100 MG capsule Commonly known as: Tessalon Perles   tamsulosin 0.4 MG Caps capsule Commonly known as: FLOMAX       TAKE these medications    albuterol 108 (90 Base) MCG/ACT inhaler Commonly known as: VENTOLIN HFA Inhale 2 puffs into the lungs every 4 (four) hours as needed for wheezing or shortness of breath.   atorvastatin 10 MG tablet Commonly known as: LIPITOR TAKE 1 TABLET(10 MG) BY MOUTH DAILY   bisacodyl 5 MG EC tablet Commonly known as: DULCOLAX Take 2 tablets (10 mg total) by mouth at bedtime. Skip the dose if no constipation   colchicine 0.6 MG tablet TAKE 1 TABLET BY MOUTH TWICE DAILY FOR ANKLE PAIN What changed:  how much to take how to take this when to take this   cyanocobalamin 1000 MCG tablet Commonly known as: VITAMIN B12 Take 1 tablet (1,000 mcg total) by mouth daily.   dapagliflozin propanediol 10 MG Tabs tablet Commonly known as: Farxiga TAKE 1 TABLET(10 MG) BY MOUTH DAILY BEFORE BREAKFAST   edoxaban 30 MG Tabs tablet Commonly known as: SAVAYSA Take 30 mg by mouth daily.   ferrous sulfate 325 (65 FE) MG tablet Take 1 tablet (325 mg total) by mouth daily.   furosemide 20 MG tablet Commonly known as: LASIX Take 1 tablet (20 mg total) by mouth daily.   Metoprolol Tartrate 37.5 MG Tabs Take 37.5 mg by mouth 2 (two) times daily.   OneTouch Verio test strip Generic drug: glucose blood USE ONCE DAILY   pantoprazole 40 MG tablet Commonly known as: Protonix Take 1 tablet (40 mg total) by mouth daily.   polyethylene glycol 17 g packet Commonly  known as: MIRALAX / GLYCOLAX Take 17 g by mouth 2 (two) times daily. Skip the dose if no constipation   Spiriva Respimat 2.5 MCG/ACT Aers Generic drug: Tiotropium Bromide Monohydrate INHALE 2 PUFFS INTO THE LUNGS DAILY   sucralfate 1 g tablet Commonly known as: Carafate Take 1 tablet (1 g total) by mouth 4 (four) times daily.   Xtandi 40 MG tablet Generic drug: enzalutamide Take 4 tablets (160 mg total) by mouth daily.       No Known Allergies Discharge Instructions     Call MD for:   Complete by: As directed    GI bleeding   Call MD for:  difficulty breathing, headache or visual disturbances   Complete by: As directed    Call MD for:  extreme fatigue   Complete by: As directed    Call MD for:  persistant dizziness or light-headedness   Complete by: As directed    Call MD for:  persistant nausea and vomiting   Complete by: As directed    Call MD for:  severe uncontrolled pain   Complete by: As directed    Call MD for:  temperature >100.4   Complete by: As directed  Diet - low sodium heart healthy   Complete by: As directed    Discharge instructions   Complete by: As directed    Follow-up with PCP in 1 week, repeat CBC and BMP after 1 week. Follow-up with GI if any GI bleeding   Increase activity slowly   Complete by: As directed        The results of significant diagnostics from this hospitalization (including imaging, microbiology, ancillary and laboratory) are listed below for reference.    Significant Diagnostic Studies: CT ABDOMEN PELVIS WO CONTRAST  Result Date: 07/23/2022 CLINICAL DATA:  Constipation with rectal bleeding EXAM: CT ABDOMEN AND PELVIS WITHOUT CONTRAST TECHNIQUE: Multidetector CT imaging of the abdomen and pelvis was performed following the standard protocol without IV contrast. RADIATION DOSE REDUCTION: This exam was performed according to the departmental dose-optimization program which includes automated exposure control, adjustment of the  mA and/or kV according to patient size and/or use of iterative reconstruction technique. COMPARISON:  CT abdomen and pelvis dated 10/16/2021 and multiple priors FINDINGS: Lower chest: Mild bilateral lower lobe bronchiectasis. Leadless pacemaker within the right ventricle. Calcification of left properly muscles. No pleural effusion or pneumothorax demonstrated. Partially imaged heart size is normal. Hepatobiliary: Unchanged scattered cysts measuring up to 3.4 cm segment 4 cyst (2:23) and 4.7 x 4.0 cm segment 7/8 (2:24) presumed involuting hemorrhagic/proteinaceous cyst. No intra or extrahepatic biliary ductal dilation. Normal gallbladder. Pancreas: No focal lesions or main ductal dilation. Spleen: Normal in size without focal abnormality. Adrenals/Urinary Tract: No adrenal nodules. Bilateral renal cortical scarring. Multiple bilateral nonobstructing stones. No hydronephrosis. No focal bladder wall thickening. Stomach/Bowel: Normal appearance of the stomach. Ingested radiodense capsular structure in left upper quadrant small bowel loops. Mild mural thickening of the rectum. Colonic diverticulosis without acute diverticulitis. Appendix is not discretely seen, likely surgically absent. Vascular/Lymphatic: Aortic atherosclerosis. No enlarged abdominal or pelvic lymph nodes. Reproductive: Prostatectomy. Other: No free fluid, fluid collection, or free air. Sequela of prior fat necrosis in the anterior left lower quadrant (2:56). Musculoskeletal: No acute or abnormal lytic or blastic osseous lesions. Multilevel degenerative changes of the partially imaged thoracic and lumbar spine. Postsurgical changes of the anterior abdominal wall. Similar subcutaneous soft tissue thickening of the left anterior abdominal wall (2:42). Small fat-containing paraumbilical hernia. IMPRESSION: 1. Mild mural thickening of the rectum, which may be due to underdistention or proctitis. 2. Colonic diverticulosis without acute diverticulitis. 3.  Bilateral nonobstructing nephrolithiasis. 4.  Aortic Atherosclerosis (ICD10-I70.0). Electronically Signed   By: Agustin Cree M.D.   On: 07/23/2022 14:45   ECHOCARDIOGRAM COMPLETE  Result Date: 07/09/2022    ECHOCARDIOGRAM REPORT   Patient Name:   HADYN TIKKANEN Date of Exam: 07/09/2022 Medical Rec #:  [id number]         Height:       72.0 in Accession #:    [id number]        Weight:       235.4 lb Date of Birth:  02/18/1933          BSA:          2.283 m Patient Age:    89 years          BP:           112/78 mmHg Patient Gender: M                 HR:           108 bpm. Exam Location:  [facility] Procedure: 2D Echo, 3D  Echo, Cardiac Doppler, Color Doppler and Strain Analysis Indications:    R07.9* Chest pain, unspecified  History:        Patient has prior history of Echocardiogram examinations.                 Pacemaker, Stroke, Arrythmias:Atrial Fibrillation,                 Signs/Symptoms:Chest Pain; Risk Factors:Hypertension, Diabetes,                 Dyslipidemia, Former Smoker and Sleep Apnea.  Sonographer:    Ilda Mori RDCS, MHA Referring Phys: [id number] Debbe Odea  Sonographer Comments: Global longitudinal strain was attempted. IMPRESSIONS  1. Left ventricular ejection fraction, by estimation, is 55 to 60%. Left ventricular ejection fraction by 3D volume is 64 %. The left ventricle has normal function. The left ventricle has no regional wall motion abnormalities. There is moderate left ventricular hypertrophy. Left ventricular diastolic parameters are indeterminate. The average left ventricular global longitudinal strain is -15.2 %.  2. Right ventricular systolic function is normal. The right ventricular size is normal. There is mildly elevated pulmonary artery systolic pressure. The estimated right ventricular systolic pressure is 40.3 mmHg.  3. Left atrial size was severely dilated.  4. The mitral valve is normal in structure. Mild mitral valve regurgitation. No evidence of mitral stenosis.  5.  Tricuspid valve regurgitation is moderate.  6. The aortic valve is tricuspid. Aortic valve regurgitation is not visualized. Aortic valve sclerosis is present, with no evidence of aortic valve stenosis.  7. The inferior vena cava is normal in size with greater than 50% respiratory variability, suggesting right atrial pressure of 3 mmHg. FINDINGS  Left Ventricle: Left ventricular ejection fraction, by estimation, is 55 to 60%. Left ventricular ejection fraction by 3D volume is 64 %. The left ventricle has normal function. The left ventricle has no regional wall motion abnormalities. The average left ventricular global longitudinal strain is -15.2 %. The left ventricular internal cavity size was normal in size. There is moderate left ventricular hypertrophy. Left ventricular diastolic parameters are indeterminate. Right Ventricle: The right ventricular size is normal. No increase in right ventricular wall thickness. Right ventricular systolic function is normal. There is mildly elevated pulmonary artery systolic pressure. The tricuspid regurgitant velocity is 2.97  m/s, and with an assumed right atrial pressure of 5 mmHg, the estimated right ventricular systolic pressure is 40.3 mmHg. Left Atrium: Left atrial size was severely dilated. Right Atrium: Right atrial size was normal in size. Pericardium: There is no evidence of pericardial effusion. Mitral Valve: The mitral valve is normal in structure. Mild mitral valve regurgitation. No evidence of mitral valve stenosis. Tricuspid Valve: The tricuspid valve is normal in structure. Tricuspid valve regurgitation is moderate . No evidence of tricuspid stenosis. Aortic Valve: The aortic valve is tricuspid. Aortic valve regurgitation is not visualized. Aortic valve sclerosis is present, with no evidence of aortic valve stenosis. Pulmonic Valve: The pulmonic valve was normal in structure. Pulmonic valve regurgitation is not visualized. No evidence of pulmonic stenosis. Aorta:  The aortic root is normal in size and structure. Venous: The inferior vena cava is normal in size with greater than 50% respiratory variability, suggesting right atrial pressure of 3 mmHg. IAS/Shunts: No atrial level shunt detected by color flow Doppler.  LEFT VENTRICLE PLAX 2D LVIDd:         4.70 cm LVIDs:         4.30 cm  2D LV PW:         1.30 cm         Longitudinal LV IVS:        1.30 cm         Strain                                2D Strain GLS  -15.2 %                                Avg:                                 3D Volume EF                                LV 3D EF:    Left                                             ventricul                                             ar                                             ejection                                             fraction                                             by 3D                                             volume is                                             64 %.                                 3D Volume EF:                                3D EF:        64 %  LV EDV:       134 ml                                LV ESV:       48 ml                                LV SV:        86 ml LEFT ATRIUM              Index LA diam:        4.40 cm  1.93 cm/m LA Vol (A2C):   111.0 ml 48.61 ml/m LA Vol (A4C):   129.0 ml 56.50 ml/m LA Biplane Vol: 122.0 ml 53.43 ml/m   AORTA Ao Root diam: 3.30 cm Ao Asc diam:  3.20 cm TRICUSPID VALVE TR Peak grad:   35.3 mmHg TR Vmax:        297.00 cm/s Julien Nordmann MD Electronically signed by Julien Nordmann MD Signature Date/Time: 07/09/2022/6:27:04 PM    Final    NM Myocar Multi W/Spect Izetta Dakin Motion / EF  Result Date: 07/08/2022   Probably normal pharmacologic myocardial perfusion stress test without evidence of significant ischemia or scar.   Left ventricular systolic function is normal (LVEF 55%).   Leadless pacemaker noted at the RV apex.  Coronary artery calcifications  are noted on the attenuation correction CT.   Elevation of the right hemidiaphragm again noted, as well as small calcifications in the visualized left kidney compatible with previously described renal calculi.   This is a low risk study.  Sensitivity and specificity are degraded by significant extracardiac activity and diaphragmatic attenuation.   DG Chest Port 1 View  Result Date: 06/30/2022 CLINICAL DATA:  Chest pain. EXAM: PORTABLE CHEST 1 VIEW COMPARISON:  June 27, 2022 FINDINGS: The there is stable mild to moderate severity cardiac silhouette enlargement. Mild, stable elevation of the left hemidiaphragm is seen. Mild, chronic appearing increased lung markings are seen with mild atelectasis noted within the bilateral lung bases. No pleural effusion or pneumothorax is identified. The visualized skeletal structures are unremarkable. IMPRESSION: 1. Stable cardiomegaly with mild, stable bibasilar atelectasis. Electronically Signed   By: Aram Candela M.D.   On: 06/30/2022 20:49   DG Chest 2 View  Result Date: 06/27/2022 CLINICAL DATA:  Chest pain. EXAM: CHEST - 2 VIEW COMPARISON:  November 13, 2021. FINDINGS: Stable cardiomegaly. Elevated left hemidiaphragm is noted. Minimal bibasilar subsegmental atelectasis or scarring is noted. Bony thorax is unremarkable. IMPRESSION: Minimal bibasilar subsegmental atelectasis or scarring. Electronically Signed   By: Lupita Raider M.D.   On: 06/27/2022 15:15    Microbiology: No results found for this or any previous visit (from the past 240 hour(s)).   Labs: CBC: Recent Labs  Lab 07/23/22 1138 07/23/22 1652 07/24/22 0501  WBC 5.7 6.1 6.6  HGB 15.6 15.5 15.8  HCT 47.3 47.7 48.9  MCV 93.7 94.3 94.8  PLT 195 191 179   Basic Metabolic Panel: Recent Labs  Lab 07/23/22 1138 07/24/22 0501  NA 136 137  K 4.2 3.6  CL 104 103  CO2 23 26  GLUCOSE 189* 150*  BUN 37* 30*  CREATININE 1.83* 1.59*  CALCIUM 9.2 9.1   Liver Function Tests: Recent Labs   Lab 07/23/22 1138  AST 17  ALT 12  ALKPHOS 149*  BILITOT 0.6  PROT 7.4  ALBUMIN 3.6   No results for input(s): "LIPASE", "AMYLASE" in the last 168 hours. No results for input(s): "AMMONIA" in the last 168 hours. Cardiac Enzymes: No results for input(s): "CKTOTAL", "CKMB", "CKMBINDEX", "TROPONINI" in the last 168 hours. BNP (last 3 results) Recent Labs    10/03/21 2116 07/23/22 1138  BNP 144.8* 233.3*   CBG: Recent Labs  Lab 07/23/22 1706 07/23/22 2002 07/24/22 0715 07/24/22 1203 07/24/22 1555  GLUCAP 146* 159* 150* 161* 93    Time spent: 35 minutes  Signed:  Gillis Santa  [facility]  07/24/2022 4:14 PM

## 2022-07-24 NOTE — Care Management Obs Status (Signed)
MEDICARE OBSERVATION STATUS NOTIFICATION   Patient Details  Name: LANSON ESBENSHADE MRN: [id number] Date of Birth: 1933/03/10   Medicare Observation Status Notification Given:  Yes    Leia Coletti, LCSW 07/24/2022, 2:11 PM

## 2022-07-24 NOTE — Progress Notes (Signed)
Patient discharging home. All belongings sent with patient, discharge education and teaching provided all questions answered. IV removed.

## 2022-07-24 NOTE — Progress Notes (Signed)
[facility] Progress Note  Patient: [name]    ZOX:[id number]  DOA: 07/23/2022     Date of Service: the patient was seen and examined on 07/24/2022  Chief Complaint  Patient presents with   Rectal Bleeding   Brief hospital course: [name] is a 89 y.o. male with medical history significant of atrial fibrillation on edoxaban, CHF, stage IVb prostate cancer on Xtandi, hypertension, type 2 diabetes, previous DVT, OSA, CVA, who presents to the ED due to rectal bleeding.  Patient has severe constipation, having rectal bleeding with BM each time, it got worse now. No frank GI bleeding noticed.  Patient denies any other complaints.  ED workup:  VS stable, Hb  15.5, creatinine 1.83 elevated hyperglycemia glucose 189, BNP 233 slightly elevated CT a/p: IMPRESSION: 1. Mild mural thickening of the rectum, which may be due to underdistention or proctitis. 2. Colonic diverticulosis without acute diverticulitis. 3. Bilateral nonobstructing nephrolithiasis. 4.  Aortic Atherosclerosis (ICD10-I70.0).   Assessment and Plan:  Proctitis secondary to constipation and rectal bleeding H&H stable, no active bleeding noticed Patient still has constipation, started MiraLAX, Dulcolax Enema order placed for as needed use RN was advised to check on BM and any GI bleeding Hold home anticoagulation for now  The plan is to discharge him tomorrow a.m.  Atrial fibrillation (HCC) - Continue home metoprolol - Hold home edoxaban   Chronic systolic heart failure (HCC) Patient is hypervolemic on examination, however he is asymptomatic.  He is unsure if he has been taking his home Lasix. - BNP - Resume home Lasix  - Continue home GDMT   Chronic kidney disease, stage 3b (HCC) Creatinine 1.83--1.59  Gradually improving, continue to monitor    Type II diabetes mellitus with renal manifestations (HCC) - Hold home Farxiga - SSI, sensitive   Prostate cancer (HCC) History of stage IVb prostate  cancer currently on Xtandi.  No suspicion at this time to be contributing to current presentation.   COPD (chronic obstructive pulmonary disease) (HCC) - Continue home bronchodilators   OSA (obstructive sleep apnea) - CPAP at bedtime   Essential hypertension - Resume home antihypertensives Monitor BP and titrate medications accordingly    Body mass index is 31.33 kg/m.  Interventions:  Diet: Heart healthy diet DVT Prophylaxis: SCD, pharmacological prophylaxis contraindicated due to GI bleed    Advance goals of care discussion: Full code  Family Communication: family was present at bedside, at the time of interview.  The pt provided permission to discuss medical plan with the family. Opportunity was given to ask question and all questions were answered satisfactorily.   Disposition:  Pt is from Home, admitted with Rectal bleeding and constipation, still has constipation, no BM yet, risk of bleeding., which precludes a safe discharge. Discharge to Home, when stable.  Subjective: No significant events overnight, patient has not had BM yet, no GI bleeding.  Denies any abdominal pain, no nausea vomiting. Patient received Silkolan suppository, agreed for enema if needed. We will continue to monitor until patient moved bowels and rule out any GI bleeding.  Physical Exam: General: NAD, lying comfortably Appear in no distress, affect appropriate Eyes: PERRLA ENT: Oral Mucosa Clear, moist  Neck: no JVD,  Cardiovascular: S1 and S2 Present, no Murmur,  Respiratory: good respiratory effort, Bilateral Air entry equal and Decreased, no Crackles, no wheezes Abdomen: Bowel Sound present, Soft and no tenderness,  Skin: no rashes Extremities: no Pedal edema, no calf tenderness Neurologic: without any new focal findings Gait not  checked due to patient safety concerns  Vitals:   07/23/22 2000 07/24/22 0428 07/24/22 0446 07/24/22 0717  BP: 137/81  121/82 116/82  Pulse: 91  70 68  Resp:  18  18 18   Temp: (!) 97.5 F (36.4 C)  (!) 97.5 F (36.4 C) 98.3 F (36.8 C)  TempSrc: Axillary     SpO2: 94%  99% 92%  Weight:  104.8 kg    Height:        Intake/Output Summary (Last 24 hours) at 07/24/2022 1511 Last data filed at 07/24/2022 1340 Gross per 24 hour  Intake 840 ml  Output --  Net 840 ml   Filed Weights   07/23/22 1130 07/24/22 0428  Weight: 106.1 kg 104.8 kg    Data Reviewed: I have personally reviewed and interpreted daily labs, tele strips, imagings as discussed above. I reviewed all nursing notes, pharmacy notes, vitals, pertinent old records I have discussed plan of care as described above with RN and patient/family.  CBC: Recent Labs  Lab 07/23/22 1138 07/23/22 1652 07/24/22 0501  WBC 5.7 6.1 6.6  HGB 15.6 15.5 15.8  HCT 47.3 47.7 48.9  MCV 93.7 94.3 94.8  PLT 195 191 179   Basic Metabolic Panel: Recent Labs  Lab 07/23/22 1138 07/24/22 0501  NA 136 137  K 4.2 3.6  CL 104 103  CO2 23 26  GLUCOSE 189* 150*  BUN 37* 30*  CREATININE 1.83* 1.59*  CALCIUM 9.2 9.1    Studies: No results found.  Scheduled Meds:  atorvastatin  10 mg Oral Daily   bisacodyl  10 mg Oral QHS   ferrous sulfate  325 mg Oral Daily   furosemide  20 mg Oral Daily   insulin aspart  0-9 Units Subcutaneous TID WC   metoprolol tartrate  37.5 mg Oral BID   pantoprazole  40 mg Oral Daily   polyethylene glycol  17 g Oral BID   sodium chloride flush  3 mL Intravenous Q12H   tiotropium  18 mcg Inhalation Daily   Continuous Infusions: PRN Meds: acetaminophen **OR** acetaminophen, albuterol, bisacodyl, ondansetron **OR** ondansetron (ZOFRAN) IV, sorbitol, milk of mag, mineral oil, glycerin (SMOG) enema, witch hazel-glycerin  Time spent: 35 minutes  Author: Gillis Santa. MD [facility] 07/24/2022 3:11 PM  To reach On-call, see care teams to locate the attending and reach out to them via [email or web address]. If 7PM-7AM, please contact night-coverage If you still have  difficulty reaching the attending provider, please page the [facility] (Director on Call) for [facility] on amion for assistance.

## 2022-07-26 DIAGNOSIS — G8929 Other chronic pain: Secondary | ICD-10-CM | POA: Diagnosis not present

## 2022-07-26 DIAGNOSIS — M25562 Pain in left knee: Secondary | ICD-10-CM | POA: Diagnosis not present

## 2022-07-28 NOTE — Progress Notes (Signed)
Celso Amy, PA-C [address]  [address]  [place], [place] [address]  Main: [phone]  Fax: [phone]   Gastroenterology Consultation  Referring Provider:     [name], NP Primary Care Physician:  [name], NP Primary Gastroenterologist:  Celso Amy, PA-C / Dr. Lannette Donath   Reason for Consultation:     Rectal bleeding        HPI:   [name] is a 89 y.o. y/o male referred for consultation & management  by [name], NP.    Here for ED follow-up of rectal bleeding.  Admitted to [facility] 07/23/2022 until 07/24/2022 for rectal bleeding.  Patient was having severe constipation (after he had stopped taking his Miralax).  He had been rectal bleeding for a few day with BM each time.  Was started on MiraLAX and Dulcolax with great benefit.  Treated with Dulcolax suppository and patient had multiple BMs.  Since Hospital discharge, patient continues to take MiraLAX once daily every morning.  He has not had any more rectal bleeding episodes since hospital discharge.  Having normal soft BMs daily.  Denies abdominal pain, weight loss, or other GI symptoms.  Medical history significant for atrial fibrillation on edoxaban, CHF, stage IVb prostate cancer on Xtandi, hypertension, type 2 diabetes,  COPD, previous DVT, OSA, CVA, who presents to the ED due to rectal bleeding.     He has NOT had radiation for prostate CA. Labs 07/23/2022 showed hemoglobin 15.5, creatinine 1.83, glucose 189, BNP 233.    CT a/p: IMPRESSION: 1. Mild mural thickening of the rectum, which may be due to underdistention or proctitis. 2. Colonic diverticulosis without acute diverticulitis. 3. Bilateral nonobstructing nephrolithiasis. 4.  Aortic Atherosclerosis   EGD by Dr. Tobi Bastos 02/2022 (for dysphagia) was normal.  Colonoscopy done 06/2019 by Dr. Allegra Lai (to evaluate rectal bleeding) showed rectal ulcer, possible stercoal ulcer or ischemic proctitis.  Remainder of the colon was  normal.  Past Medical History:  Diagnosis Date   Acute deep vein thrombosis (DVT) of femoral vein of left lower extremity (HCC)    Atrial fibrillation (HCC)    CHF (congestive heart failure) (HCC)    Chronic kidney disease    Diabetes (HCC)    Hearing loss    High blood pressure    History of bladder problems    Hyperlipidemia    Kidney stones 11/22/2014   Liver cyst    OSA (obstructive sleep apnea)    Prostate cancer (HCC)    Prostate cancer (HCC)    Stroke (HCC)    Valvular heart disease     Past Surgical History:  Procedure Laterality Date   CATARACT EXTRACTION     COLONOSCOPY WITH PROPOFOL N/A 07/02/2019   Procedure: COLONOSCOPY WITH PROPOFOL;  Surgeon: Toney Reil, MD;  Location: [facility] ENDOSCOPY;  Service: Gastroenterology;  Laterality: N/A;   ESOPHAGOGASTRODUODENOSCOPY (EGD) WITH PROPOFOL N/A 03/19/2022   Procedure: ESOPHAGOGASTRODUODENOSCOPY (EGD) WITH PROPOFOL;  Surgeon: Wyline Mood, MD;  [facility] END[facility]COPY;  Service: Gastroenterology;  Laterality: N/A;   IR CT HEAD LTD  08/27/2019   IR PERCUTANEOUS ART THROMBECTOMY/INFUSION INTRACRANIAL INC DIAG ANGIO  08/27/2019       IR PERCUTANEOUS ART THROMBECTOMY/INFUSION INTRACRANIAL INC DIAG ANGIO  08/27/2019   LOWER EXTREMITY ANGIOGRAPHY Left 12/25/2020   Procedure: Lower Extremity Angiography;  Surgeon: Annice Needy, MD;  Location: [facility] INVASIVE CV LAB;  Service: Cardiovascular;  Laterality: Left;   PACEMAKER LEADLESS INSERTION N/A 12/19/2020   Procedure: PACEMAKER LEADLESS INSERTION;  Surgeon: Marcina Millard, MD;  Location: [facility] INVASIVE CV LAB;  Service: Cardiovascular;  Laterality: N/A;   PROSTATE CANCER     PROSTATE SURGERY     RADIOLOGY WITH ANESTHESIA N/A 08/27/2019   Procedure: IR WITH ANESTHESIA;  Surgeon: Julieanne Cotton, MD;  Location: [facility] OR;  Service: Radiology;  Laterality: N/A;    Prior to Admission medications   Medication Sig Start Date End Date Taking? Authorizing Provider  albuterol (VENTOLIN  HFA) 108 (90 Base) MCG/ACT inhaler Inhale 2 puffs into the lungs every 4 (four) hours as needed for wheezing or shortness of breath. 03/16/21   [name], NP  atorvastatin (LIPITOR) 10 MG tablet TAKE 1 TABLET(10 MG) BY MOUTH DAILY 12/20/21   [name], NP  bisacodyl (DULCOLAX) 5 MG EC tablet Take 2 tablets (10 mg total) by mouth at bedtime. Skip the dose if no constipation 07/24/22 10/22/22  Gillis Santa, MD  colchicine 0.6 MG tablet TAKE 1 TABLET BY MOUTH TWICE DAILY FOR ANKLE PAIN Patient taking differently: Take 0.6 mg by mouth 2 (two) times daily. TAKE 1 TABLET BY MOUTH TWICE DAILY FOR ANKLE PAIN 05/20/22   Lyndon Code, MD  dapagliflozin propanediol (FARXIGA) 10 MG TABS tablet TAKE 1 TABLET(10 MG) BY MOUTH DAILY BEFORE BREAKFAST 04/09/22   [name], NP  edoxaban (SAVAYSA) 30 MG TABS tablet Take 30 mg by mouth daily. 11/01/21   [provider]  enzalutamide [name]) 40 MG tablet Take 4 tablets (160 mg total) by mouth daily. 07/17/22   [name], MD  ferrous sulfate 325 (65 FE) MG tablet Take 1 tablet (325 mg total) by mouth daily. 03/07/22   Lyndon Code, MD  furosemide (LASIX) 20 MG tablet Take 1 tablet (20 mg total) by mouth daily. 07/03/22 10/01/22  Debbe Odea, MD  glucose blood (ONETOUCH VERIO) test strip USE ONCE DAILY 02/04/22   [name], NP  metoprolol tartrate 37.5 MG TABS Take 37.5 mg by mouth 2 (two) times daily. 10/06/21   Alford Highland, MD  pantoprazole (PROTONIX) 40 MG tablet Take 1 tablet (40 mg total) by mouth daily. 06/27/22 06/27/23  Minna Antis, MD  polyethylene glycol (MIRALAX / GLYCOLAX) 17 g packet Take 17 g by mouth 2 (two) times daily. Skip the dose if no constipation 07/24/22 10/22/22  Gillis Santa, MD  SPIRIVA RESPIMAT 2.5 MCG/ACT AERS INHALE 2 PUFFS INTO THE LUNGS DAILY 06/19/22   [name], NP  sucralfate (CARAFATE) 1 g tablet Take 1 tablet (1 g total) by mouth 4 (four) times daily. 06/27/22 06/27/23  Minna Antis, MD   vitamin B-12 (CYANOCOBALAMIN) 1000 MCG tablet Take 1 tablet (1,000 mcg total) by mouth daily. 12/08/20   [name], NP    Family History  Problem Relation Age of Onset   Colon cancer Mother    Diabetes Other    High blood pressure Other    Prostate cancer Brother    Diabetes Brother      Social History   Tobacco Use   Smoking status: Former    Years: 16    Types: Cigarettes   Smokeless tobacco: Never  Vaping Use   Vaping Use: Never used  Substance Use Topics   Alcohol use: No   Drug use: No    Allergies as of 07/29/2022   (No Known Allergies)    Review of Systems:    All systems reviewed and negative except where noted in HPI.   Physical Exam:  BP 105/68   Pulse 75   Temp  97.7 F (36.5 C)   Ht 6' (1.829 m)   Wt 235 lb 3.2 oz (106.7 kg)   BMI 31.90 kg/m  No LMP for male patient. Psych:  Alert and cooperative. Normal mood and affect. General:   Slow, feable elderly male, well-nourished, pleasant and cooperative in NAD Lungs:  Respirations even and unlabored.  Clear throughout to auscultation.   No wheezes, crackles, or rhonchi. No acute distress. Heart:  Regular rate and rhythm; no murmurs, clicks, rubs, or gallops. Abdomen:  Normal bowel sounds.  No bruits.  Soft, and non-distended without masses, hepatosplenomegaly or hernias noted.  No Tenderness.  No guarding or rebound tenderness.    Neurologic:  Alert and oriented x3;  grossly normal neurologically.  Mild memory impairment.  Slow.  Wearing left knee brace.  Gets on and off exam table slowly with my help. Psych:  Alert and cooperative. Normal mood and affect.  Imaging Studies: CT ABDOMEN PELVIS WO CONTRAST  Result Date: 07/23/2022 CLINICAL DATA:  Constipation with rectal bleeding EXAM: CT ABDOMEN AND PELVIS WITHOUT CONTRAST TECHNIQUE: Multidetector CT imaging of the abdomen and pelvis was performed following the standard protocol without IV contrast. RADIATION DOSE REDUCTION: This exam was performed  according to the departmental dose-optimization program which includes automated exposure control, adjustment of the mA and/or kV according to patient size and/or use of iterative reconstruction technique. COMPARISON:  CT abdomen and pelvis dated 10/16/2021 and multiple priors FINDINGS: Lower chest: Mild bilateral lower lobe bronchiectasis. Leadless pacemaker within the right ventricle. Calcification of left properly muscles. No pleural effusion or pneumothorax demonstrated. Partially imaged heart size is normal. Hepatobiliary: Unchanged scattered cysts measuring up to 3.4 cm segment 4 cyst (2:23) and 4.7 x 4.0 cm segment 7/8 (2:24) presumed involuting hemorrhagic/proteinaceous cyst. No intra or extrahepatic biliary ductal dilation. Normal gallbladder. Pancreas: No focal lesions or main ductal dilation. Spleen: Normal in size without focal abnormality. Adrenals/Urinary Tract: No adrenal nodules. Bilateral renal cortical scarring. Multiple bilateral nonobstructing stones. No hydronephrosis. No focal bladder wall thickening. Stomach/Bowel: Normal appearance of the stomach. Ingested radiodense capsular structure in left upper quadrant small bowel loops. Mild mural thickening of the rectum. Colonic diverticulosis without acute diverticulitis. Appendix is not discretely seen, likely surgically absent. Vascular/Lymphatic: Aortic atherosclerosis. No enlarged abdominal or pelvic lymph nodes. Reproductive: Prostatectomy. Other: No free fluid, fluid collection, or free air. Sequela of prior fat necrosis in the anterior left lower quadrant (2:56). Musculoskeletal: No acute or abnormal lytic or blastic osseous lesions. Multilevel degenerative changes of the partially imaged thoracic and lumbar spine. Postsurgical changes of the anterior abdominal wall. Similar subcutaneous soft tissue thickening of the left anterior abdominal wall (2:42). Small fat-containing paraumbilical hernia. IMPRESSION: 1. Mild mural thickening of the  rectum, which may be due to underdistention or proctitis. 2. Colonic diverticulosis without acute diverticulitis. 3. Bilateral nonobstructing nephrolithiasis. 4.  Aortic Atherosclerosis (ICD10-I70.0). Electronically Signed   By: Agustin Cree M.D.   On: 07/23/2022 14:45   Assessment and Plan:   ADEDAMOLA GASPARYAN is a 89 y.o. y/o male has been referred for ED follow-up of rectal bleeding and chronic constipation.  Normal hemoglobin 15 g, no anemia.  Treated with MiraLAX and Dulcolax with great benefit.  History of A-fib, DVT, CVA with COPD, currently on edoxaban (SAVAYSA).  History of stage IV prostate cancer on Xtandi.  Colonoscopy done 06/2019 by Dr. Allegra Lai (to evaluate rectal bleeding) showed rectal ulcer, possible stercoal ulcer or ischemic proctitis.  Remainder of the colon was normal.  1.  Rectal bleeding - Resolved on Daily Miralax  Continue Miralax every day.  I discussed scheduling flexible sigmoidoscopy to further evaluate recent episode of rectal bleeding, however patient declined due to advanced age and comorbidities.  He has not had any more rectal bleeding since his recent hospital visit.  He has no anemia.  Normal hemoglobin 15 g.  He is instructed to return for follow-up if he has any recurrent episodes of rectal bleeding.  He expressed understanding.  2.  Chronic constipation  Continue daily Miralax, Stool Softener, Dulcolax prn.  Stressed the importance of daily treatment for constipation.  He is advised to remain on MiraLAX indefinitely.  Follow up as needed if he has any recurrent episodes of rectal bleeding.  Celso Amy, PA-C

## 2022-07-29 ENCOUNTER — Encounter: Payer: Self-pay | Admitting: Physician Assistant

## 2022-07-29 ENCOUNTER — Ambulatory Visit: Payer: Medicare Other | Admitting: Nurse Practitioner

## 2022-07-29 ENCOUNTER — Ambulatory Visit: Payer: Medicare Other | Admitting: Physician Assistant

## 2022-07-29 ENCOUNTER — Telehealth: Payer: Self-pay | Admitting: Nurse Practitioner

## 2022-07-29 VITALS — BP 105/68 | HR 75 | Temp 97.7°F | Ht 72.0 in | Wt 235.2 lb

## 2022-07-29 DIAGNOSIS — K5901 Slow transit constipation: Secondary | ICD-10-CM

## 2022-07-29 DIAGNOSIS — K625 Hemorrhage of anus and rectum: Secondary | ICD-10-CM | POA: Diagnosis not present

## 2022-07-29 NOTE — Telephone Encounter (Signed)
S/w spouse regarding HF. She stated they will just wait until 08/02/22 appointment-[name]

## 2022-08-02 ENCOUNTER — Ambulatory Visit: Payer: Medicare Other | Admitting: Nurse Practitioner

## 2022-08-02 ENCOUNTER — Encounter: Payer: Self-pay | Admitting: Nurse Practitioner

## 2022-08-02 VITALS — BP 120/60 | HR 74 | Temp 98.1°F | Resp 16 | Ht 72.0 in | Wt 236.8 lb

## 2022-08-02 DIAGNOSIS — E1165 Type 2 diabetes mellitus with hyperglycemia: Secondary | ICD-10-CM

## 2022-08-02 DIAGNOSIS — I700 Atherosclerosis of aorta: Secondary | ICD-10-CM | POA: Diagnosis not present

## 2022-08-02 DIAGNOSIS — I10 Essential (primary) hypertension: Secondary | ICD-10-CM | POA: Diagnosis not present

## 2022-08-02 MED ORDER — DAPAGLIFLOZIN PROPANEDIOL 10 MG PO TABS
10.0000 mg | ORAL_TABLET | Freq: Every day | ORAL | 2 refills | Status: DC
Start: 2022-08-02 — End: 2022-12-26

## 2022-08-02 MED ORDER — ATORVASTATIN CALCIUM 10 MG PO TABS: ORAL_TABLET | ORAL | 1 refills | Status: DC

## 2022-08-02 NOTE — Progress Notes (Signed)
[facility] [address] [place], [place] [address]  Internal MEDICINE  Office Visit Note  Patient Name: [name]  [id number]  [id number]  Date of Service: 08/02/2022  Chief Complaint  Patient presents with   Diabetes   Hypertension   Hyperlipidemia   Follow-up    HPI Zaryn presents for a follow-up visit for  Rectal bleeding     Current Medication: Outpatient Encounter Medications as of 08/02/2022  Medication Sig   albuterol (VENTOLIN HFA) 108 (90 Base) MCG/ACT inhaler Inhale 2 puffs into the lungs every 4 (four) hours as needed for wheezing or shortness of breath.   atorvastatin (LIPITOR) 10 MG tablet TAKE 1 TABLET(10 MG) BY MOUTH DAILY   colchicine 0.6 MG tablet TAKE 1 TABLET BY MOUTH TWICE DAILY FOR ANKLE PAIN (Patient taking differently: Take 0.6 mg by mouth 2 (two) times daily. TAKE 1 TABLET BY MOUTH TWICE DAILY FOR ANKLE PAIN)   edoxaban (SAVAYSA) 30 MG TABS tablet Take 30 mg by mouth daily.   enzalutamide (XTANDI) 40 MG tablet Take 4 tablets (160 mg total) by mouth daily.   ferrous sulfate 325 (65 FE) MG tablet Take 1 tablet (325 mg total) by mouth daily.   furosemide (LASIX) 20 MG tablet Take 1 tablet (20 mg total) by mouth daily.   glucose blood (ONETOUCH VERIO) test strip USE ONCE DAILY   metoprolol tartrate 37.5 MG TABS Take 37.5 mg by mouth 2 (two) times daily.   pantoprazole (PROTONIX) 40 MG tablet Take 1 tablet (40 mg total) by mouth daily.   polyethylene glycol (MIRALAX / GLYCOLAX) 17 g packet Take 17 g by mouth 2 (two) times daily. Skip the dose if no constipation   SPIRIVA RESPIMAT 2.5 MCG/ACT AERS INHALE 2 PUFFS INTO THE LUNGS DAILY   sucralfate (CARAFATE) 1 g tablet Take 1 tablet (1 g total) by mouth 4 (four) times daily.   vitamin B-12 (CYANOCOBALAMIN) 1000 MCG tablet Take 1 tablet (1,000 mcg total) by mouth daily.   [DISCONTINUED] bisacodyl (DULCOLAX) 5 MG EC tablet Take 2 tablets (10 mg total) by mouth at bedtime. Skip the dose if  no constipation   No facility-administered encounter medications on file as of 08/02/2022.    Surgical History: Past Surgical History:  Procedure Laterality Date   CATARACT EXTRACTION     COLONOSCOPY WITH PROPOFOL N/A 07/02/2019   Procedure: COLONOSCOPY WITH PROPOFOL;  Surgeon: Toney Reil, MD;  Location: [facility] ENDOSCOPY;  Service: Gastroenterology;  Laterality: N/A;   ESOPHAGOGASTRODUODENOSCOPY (EGD) WITH PROPOFOL N/A 03/19/2022   Procedure: ESOPHAGOGASTRODUODENOSCOPY (EGD) WITH PROPOFOL;  Surgeon: Wyline Mood, MD;  Location: [facility] ENDOSCOPY;  Service: Gastroenterology;  Laterality: N/A;   IR CT HEAD LTD  08/27/2019   IR PERCUTANEOUS ART THROMBECTOMY/INFUSION INTRACRANIAL INC DIAG ANGIO  08/27/2019       IR PERCUTANEOUS ART THROMBECTOMY/INFUSION INTRACRANIAL INC DIAG ANGIO  08/27/2019   LOWER EXTREMITY ANGIOGRAPHY Left 12/25/2020   Procedure: Lower Extremity Angiography;  Surgeon: Annice Needy, MD;  Location: [facility] INVASIVE CV LAB;  Service: Cardiovascular;  Laterality: Left;   PACEMAKER LEADLESS INSERTION N/A 12/19/2020   Procedure: PACEMAKER LEADLESS INSERTION;  Surgeon: Marcina Millard, MD;  Location: [facility] INVASIVE CV LAB;  Service: Cardiovascular;  Laterality: N/A;   PROSTATE CANCER     PROSTATE SURGERY     RADIOLOGY WITH ANESTHESIA N/A 08/27/2019   Procedure: IR WITH ANESTHESIA;  Surgeon: Julieanne Cotton, MD;  Location: [facility] OR;  Service: Radiology;  Laterality: N/A;    Medical History: Past Medical  History:  Diagnosis Date   Acute deep vein thrombosis (DVT) of femoral vein of left lower extremity (HCC)    Atrial fibrillation (HCC)    CHF (congestive heart failure) (HCC)    Chronic kidney disease    Diabetes (HCC)    Hearing loss    High blood pressure    History of bladder problems    Hyperlipidemia    Kidney stones 11/22/2014   Liver cyst    OSA (obstructive sleep apnea)    Prostate cancer (HCC)    Prostate cancer (HCC)    Stroke (HCC)    Valvular heart disease      Family History: Family History  Problem Relation Age of Onset   Colon cancer Mother    Diabetes Other    High blood pressure Other    Prostate cancer Brother    Diabetes Brother     Social History   Socioeconomic History   Marital status: Married    Spouse name: Jayvien Hammers   Number of children: 2   Years of education: 12   Highest education level: 12th grade  Occupational History   Occupation: [employer]  Tobacco Use   Smoking status: Former    Types: Cigarettes   Smokeless tobacco: Never  Vaping Use   Vaping status: Never Used  Substance and Sexual Activity   Alcohol use: No   Drug use: No   Sexual activity: Not Currently  Other Topics Concern   Not on file  Social History Narrative   Not on file   Social Determinants of Health   Financial Resource Strain: Low Risk  (10/29/2019)   Overall Financial Resource Strain (CARDIA)    Difficulty of Paying Living Expenses: Not very hard  Recent Concern: Financial Resource Strain - Medium Risk (09/08/2019)   Overall Financial Resource Strain (CARDIA)    Difficulty of Paying Living Expenses: Somewhat hard  Food Insecurity: No Food Insecurity (07/23/2022)   Hunger Vital Sign    Worried About Running Out of Food in the Last Year: Never true    Ran Out of Food in the Last Year: Never true  Transportation Needs: No Transportation Needs (07/23/2022)   PRAPARE - Administrator, Civil Service (Medical): No    Lack of Transportation (Non-Medical): No  Physical Activity: Inactive (10/29/2019)   Exercise Vital Sign    Days of Exercise per Week: 0 days    Minutes of Exercise per Session: 0 min  Stress: No Stress Concern Present (10/29/2019)   [employer] of Occupational Health - Occupational Stress Questionnaire    Feeling of Stress : Only a little  Recent Concern: Stress - Stress Concern Present (09/08/2019)   [employer] of Occupational Health - Occupational Stress Questionnaire    Feeling of Stress : To  some extent  Social Connections: Moderately Integrated (10/29/2019)   Social Connection and Isolation Panel [NHANES]    Frequency of Communication with Friends and Family: More than three times a week    Frequency of Social Gatherings with Friends and Family: More than three times a week    Attends Religious Services: More than 4 times per year    Active Member of [employer] or Organizations: No    Attends Banker Meetings: Never    Marital Status: Married  Catering manager Violence: Not At Risk (07/23/2022)   Humiliation, Afraid, Rape, and Kick questionnaire    Fear of Current or Ex-Partner: No    Emotionally Abused: No    Physically Abused:  No    Sexually Abused: No      Review of Systems  Vital Signs: BP 120/60 Comment: 160/68  Pulse 74   Temp 98.1 F (36.7 C)   Resp 16   Ht 6' (1.829 m)   Wt 236 lb 12.8 oz (107.4 kg)   SpO2 94%   BMI 32.12 kg/m    Physical Exam     Assessment/Plan:   General Counseling: Verner verbalizes understanding of the findings of todays visit and agrees with plan of treatment. I have discussed any further diagnostic evaluation that may be needed or ordered today. We also reviewed his medications today. he has been encouraged to call the office with any questions or concerns that should arise related to todays visit.    No orders of the defined types were placed in this encounter.   No orders of the defined types were placed in this encounter.   Return in about 2 months (around 10/03/2022) for F/U, Recheck A1C, Polly Barner PCP.   Total time spent:*** Minutes Time spent includes review of chart, medications, test results, and follow up plan with the patient.   [place] Controlled Substance Database was reviewed by me.  This patient was seen by [name], FNP-C in collaboration with Dr. Beverely Risen as a part of collaborative care agreement.   Micole Delehanty R. Tedd Sias, MSN, FNP-C Internal medicine

## 2022-08-03 ENCOUNTER — Encounter: Payer: Self-pay | Admitting: Nurse Practitioner

## 2022-08-07 ENCOUNTER — Ambulatory Visit: Payer: Medicare Other | Admitting: Cardiology

## 2022-08-09 DIAGNOSIS — M25562 Pain in left knee: Secondary | ICD-10-CM | POA: Diagnosis not present

## 2022-08-09 DIAGNOSIS — G8929 Other chronic pain: Secondary | ICD-10-CM | POA: Diagnosis not present

## 2022-08-14 ENCOUNTER — Other Ambulatory Visit (HOSPITAL_COMMUNITY): Payer: Self-pay

## 2022-08-14 DIAGNOSIS — Z950 Presence of cardiac pacemaker: Secondary | ICD-10-CM | POA: Insufficient documentation

## 2022-08-16 DIAGNOSIS — I5032 Chronic diastolic (congestive) heart failure: Secondary | ICD-10-CM | POA: Diagnosis not present

## 2022-08-16 DIAGNOSIS — M25562 Pain in left knee: Secondary | ICD-10-CM | POA: Diagnosis not present

## 2022-08-16 DIAGNOSIS — I10 Essential (primary) hypertension: Secondary | ICD-10-CM | POA: Diagnosis not present

## 2022-08-16 DIAGNOSIS — G8929 Other chronic pain: Secondary | ICD-10-CM | POA: Diagnosis not present

## 2022-08-16 DIAGNOSIS — I482 Chronic atrial fibrillation, unspecified: Secondary | ICD-10-CM | POA: Diagnosis not present

## 2022-08-16 DIAGNOSIS — N1832 Chronic kidney disease, stage 3b: Secondary | ICD-10-CM | POA: Diagnosis not present

## 2022-08-20 DIAGNOSIS — G4733 Obstructive sleep apnea (adult) (pediatric): Secondary | ICD-10-CM | POA: Diagnosis not present

## 2022-08-23 DIAGNOSIS — M25562 Pain in left knee: Secondary | ICD-10-CM | POA: Diagnosis not present

## 2022-08-23 DIAGNOSIS — G8929 Other chronic pain: Secondary | ICD-10-CM | POA: Diagnosis not present

## 2022-08-29 ENCOUNTER — Ambulatory Visit: Payer: Medicare Other | Admitting: Nurse Practitioner

## 2022-08-29 ENCOUNTER — Encounter: Payer: Self-pay | Admitting: Nurse Practitioner

## 2022-08-29 VITALS — Resp 16 | Ht 72.0 in | Wt 232.0 lb

## 2022-08-29 DIAGNOSIS — E1165 Type 2 diabetes mellitus with hyperglycemia: Secondary | ICD-10-CM | POA: Diagnosis not present

## 2022-08-29 MED ORDER — GLIPIZIDE ER 2.5 MG PO TB24
2.5000 mg | ORAL_TABLET | Freq: Every day | ORAL | 1 refills | Status: DC
Start: 2022-08-29 — End: 2022-12-26
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
## 2022-09-02 NOTE — Progress Notes (Unsigned)
  Electrophysiology Office Note:    Date:  09/03/2022   ID:  [name], DOB July 05, 1933, MRN [id number]  [facility] Cardiologist:  Debbe Odea, MD  [facility] Electrophysiologist:  Lanier Prude, MD   Referring MD: [name], NP   Chief Complaint: Pacemaker  History of Present Illness:    [name] is a 89 y.o. malewho I am seeing today for an evaluation of a leadless pacemaker at the request of Dr. Azucena Cecil.  The patient was last seen by Dr. Azucena Cecil on July 03, 2022.  The patient has a medical history that includes permanent atrial fibrillation, hypertension, CKD 4, DVT, leadless pacemaker implanted November 2022, metastatic prostate cancer on chemo.  He is recently transferred his care to [facility] from [place] [facility].  He was previously on Eliquis for stroke prophylaxis but this was transitioned to edoxaban given concerns for drug interactions with his chemo.  He was hospitalized on July 23, 2022 for rectal bleeding.  He was severely constipated during the hospitalization and the bleeding was secondary to this constipation.  The edoxaban was continued at discharge.  Today he is doing well.  No problems with his pacemaker that he is aware of.  He takes edoxaban for anticoagulation.        Their past medical, social and family history was reveiwed.   ROS:   Please see the history of present illness.    All other systems reviewed and are negative.  EKGs/Labs/Other Studies Reviewed:    The following studies were reviewed today:  September 03, 2022 in clinic device interrogation personally reviewed Battery longevity greater than 8 years Lead parameter stable Ventricular pacing 88.7% VVIR 70  EKG Interpretation Date/Time:  Tuesday September 03 2022 08:01:40 EDT Ventricular Rate:  82 PR Interval:    QRS Duration:  162 QT Interval:  418 QTC Calculation: 488 R Axis:   -14  Text Interpretation: Ventricular-paced rhythm with  occasional Premature ventricular complexes Confirmed by Steffanie Dunn [phone]) on 09/03/2022 8:27:15 AM    Physical Exam:    VS:  BP 118/66   Pulse 82   Ht 6' (1.829 m)   Wt 240 lb 12.8 oz (109.2 kg)   SpO2 95%   BMI 32.66 kg/m     Wt Readings from Last 3 Encounters:  09/03/22 240 lb 12.8 oz (109.2 kg)  08/29/22 232 lb (105.2 kg)  08/02/22 236 lb 12.8 oz (107.4 kg)     GEN:  Well nourished, well developed in no acute distress.  Elderly CARDIAC: RRR, no murmurs, rubs, gallops RESPIRATORY:  Clear to auscultation without rales, wheezing or rhonchi       ASSESSMENT AND PLAN:    1. Permanent atrial fibrillation (HCC)   2. Pacemaker     #Tachybradycardia #Permanent atrial fibrillation #Leadless pacemaker in situ Device functioning appropriately.  Continue remote monitoring.  Continue edoxaban for stroke prophylaxis  #Hypertension At goal today.  Recommend checking blood pressures 1-2 times per week at home and recording the values.  Recommend bringing these recordings to the primary care physician. Continue metoprolol   Follow-up 1 year with APP.    Signed, [name]. Glenn Brothers, MD, [facility] 09/03/2022 8:32 AM    Electrophysiology [facility]

## 2022-09-03 ENCOUNTER — Encounter: Payer: Self-pay | Admitting: Cardiology

## 2022-09-03 ENCOUNTER — Ambulatory Visit: Payer: Medicare Other | Attending: Cardiology | Admitting: Cardiology

## 2022-09-03 VITALS — BP 118/66 | HR 82 | Ht 72.0 in | Wt 240.8 lb

## 2022-09-03 DIAGNOSIS — I4821 Permanent atrial fibrillation: Secondary | ICD-10-CM | POA: Diagnosis not present

## 2022-09-03 DIAGNOSIS — Z950 Presence of cardiac pacemaker: Secondary | ICD-10-CM

## 2022-09-03 NOTE — Patient Instructions (Signed)
 Medication Instructions:  Your physician recommends that you continue on your current medications as directed. Please refer to the Current Medication list given to you today.  *If you need a refill on your cardiac medications before your next appointment, please call your pharmacy*  Follow-Up: At [facility], you and your health needs are our priority.  As part of our continuing mission to provide you with exceptional heart care, we have created designated Provider Care Teams.  These Care Teams include your primary Cardiologist (physician) and Advanced Practice Providers (APPs -  Physician Assistants and Nurse Practitioners) who all work together to provide you with the care you need, when you need it.  Your next appointment:   1 year  Provider:   Steffanie Dunn, MD

## 2022-09-06 DIAGNOSIS — M25521 Pain in right elbow: Secondary | ICD-10-CM | POA: Diagnosis not present

## 2022-09-06 DIAGNOSIS — L03113 Cellulitis of right upper limb: Secondary | ICD-10-CM | POA: Diagnosis not present

## 2022-09-06 DIAGNOSIS — M7989 Other specified soft tissue disorders: Secondary | ICD-10-CM | POA: Diagnosis not present

## 2022-09-06 DIAGNOSIS — M79631 Pain in right forearm: Secondary | ICD-10-CM | POA: Diagnosis not present

## 2022-09-06 DIAGNOSIS — M25421 Effusion, right elbow: Secondary | ICD-10-CM | POA: Diagnosis not present

## 2022-09-08 ENCOUNTER — Inpatient Hospital Stay: Payer: Medicare Other

## 2022-09-08 ENCOUNTER — Other Ambulatory Visit: Payer: Self-pay

## 2022-09-08 ENCOUNTER — Inpatient Hospital Stay
Admission: EM | Admit: 2022-09-08 | Discharge: 2022-09-16 | DRG: 603 | Disposition: A | Discharge status: Home / Self Care | Payer: Medicare Other | Attending: Internal Medicine | Admitting: Internal Medicine

## 2022-09-08 DIAGNOSIS — I472 Ventricular tachycardia, unspecified: Secondary | ICD-10-CM | POA: Diagnosis not present

## 2022-09-08 DIAGNOSIS — N179 Acute kidney failure, unspecified: Secondary | ICD-10-CM | POA: Diagnosis present

## 2022-09-08 DIAGNOSIS — E1122 Type 2 diabetes mellitus with diabetic chronic kidney disease: Secondary | ICD-10-CM | POA: Diagnosis not present

## 2022-09-08 DIAGNOSIS — M79601 Pain in right arm: Secondary | ICD-10-CM | POA: Diagnosis not present

## 2022-09-08 DIAGNOSIS — E860 Dehydration: Secondary | ICD-10-CM | POA: Diagnosis present

## 2022-09-08 DIAGNOSIS — Z8042 Family history of malignant neoplasm of prostate: Secondary | ICD-10-CM

## 2022-09-08 DIAGNOSIS — I4821 Permanent atrial fibrillation: Secondary | ICD-10-CM | POA: Diagnosis not present

## 2022-09-08 DIAGNOSIS — E1165 Type 2 diabetes mellitus with hyperglycemia: Secondary | ICD-10-CM | POA: Diagnosis not present

## 2022-09-08 DIAGNOSIS — L03113 Cellulitis of right upper limb: Secondary | ICD-10-CM | POA: Diagnosis not present

## 2022-09-08 DIAGNOSIS — Z86718 Personal history of other venous thrombosis and embolism: Secondary | ICD-10-CM | POA: Diagnosis not present

## 2022-09-08 DIAGNOSIS — M778 Other enthesopathies, not elsewhere classified: Secondary | ICD-10-CM | POA: Diagnosis not present

## 2022-09-08 DIAGNOSIS — Z8673 Personal history of transient ischemic attack (TIA), and cerebral infarction without residual deficits: Secondary | ICD-10-CM | POA: Diagnosis not present

## 2022-09-08 DIAGNOSIS — Z9849 Cataract extraction status, unspecified eye: Secondary | ICD-10-CM

## 2022-09-08 DIAGNOSIS — E1129 Type 2 diabetes mellitus with other diabetic kidney complication: Secondary | ICD-10-CM | POA: Diagnosis present

## 2022-09-08 DIAGNOSIS — D72829 Elevated white blood cell count, unspecified: Secondary | ICD-10-CM | POA: Diagnosis present

## 2022-09-08 DIAGNOSIS — E669 Obesity, unspecified: Secondary | ICD-10-CM | POA: Diagnosis present

## 2022-09-08 DIAGNOSIS — N1831 Chronic kidney disease, stage 3a: Secondary | ICD-10-CM | POA: Diagnosis not present

## 2022-09-08 DIAGNOSIS — Z794 Long term (current) use of insulin: Secondary | ICD-10-CM | POA: Diagnosis not present

## 2022-09-08 DIAGNOSIS — R0602 Shortness of breath: Secondary | ICD-10-CM | POA: Diagnosis not present

## 2022-09-08 DIAGNOSIS — M19011 Primary osteoarthritis, right shoulder: Secondary | ICD-10-CM | POA: Diagnosis not present

## 2022-09-08 DIAGNOSIS — M7989 Other specified soft tissue disorders: Secondary | ICD-10-CM | POA: Diagnosis not present

## 2022-09-08 DIAGNOSIS — M25421 Effusion, right elbow: Secondary | ICD-10-CM | POA: Diagnosis not present

## 2022-09-08 DIAGNOSIS — N1832 Chronic kidney disease, stage 3b: Secondary | ICD-10-CM | POA: Diagnosis not present

## 2022-09-08 DIAGNOSIS — I5032 Chronic diastolic (congestive) heart failure: Secondary | ICD-10-CM | POA: Diagnosis not present

## 2022-09-08 DIAGNOSIS — E872 Acidosis, unspecified: Secondary | ICD-10-CM | POA: Insufficient documentation

## 2022-09-08 DIAGNOSIS — Z87891 Personal history of nicotine dependence: Secondary | ICD-10-CM | POA: Diagnosis not present

## 2022-09-08 DIAGNOSIS — Z8546 Personal history of malignant neoplasm of prostate: Secondary | ICD-10-CM | POA: Diagnosis not present

## 2022-09-08 DIAGNOSIS — E785 Hyperlipidemia, unspecified: Secondary | ICD-10-CM | POA: Diagnosis not present

## 2022-09-08 DIAGNOSIS — Z7901 Long term (current) use of anticoagulants: Secondary | ICD-10-CM

## 2022-09-08 DIAGNOSIS — I4729 Other ventricular tachycardia: Secondary | ICD-10-CM | POA: Diagnosis present

## 2022-09-08 DIAGNOSIS — I959 Hypotension, unspecified: Secondary | ICD-10-CM | POA: Diagnosis not present

## 2022-09-08 DIAGNOSIS — L03114 Cellulitis of left upper limb: Secondary | ICD-10-CM | POA: Diagnosis present

## 2022-09-08 DIAGNOSIS — Z833 Family history of diabetes mellitus: Secondary | ICD-10-CM

## 2022-09-08 DIAGNOSIS — Z7984 Long term (current) use of oral hypoglycemic drugs: Secondary | ICD-10-CM

## 2022-09-08 DIAGNOSIS — R600 Localized edema: Secondary | ICD-10-CM | POA: Diagnosis not present

## 2022-09-08 DIAGNOSIS — G4733 Obstructive sleep apnea (adult) (pediatric): Secondary | ICD-10-CM | POA: Diagnosis not present

## 2022-09-08 DIAGNOSIS — Z79899 Other long term (current) drug therapy: Secondary | ICD-10-CM

## 2022-09-08 DIAGNOSIS — M19031 Primary osteoarthritis, right wrist: Secondary | ICD-10-CM | POA: Diagnosis not present

## 2022-09-08 DIAGNOSIS — I130 Hypertensive heart and chronic kidney disease with heart failure and stage 1 through stage 4 chronic kidney disease, or unspecified chronic kidney disease: Secondary | ICD-10-CM | POA: Diagnosis not present

## 2022-09-08 DIAGNOSIS — I517 Cardiomegaly: Secondary | ICD-10-CM | POA: Diagnosis not present

## 2022-09-08 DIAGNOSIS — M19021 Primary osteoarthritis, right elbow: Secondary | ICD-10-CM | POA: Diagnosis not present

## 2022-09-08 DIAGNOSIS — Z800 Family history of malignant neoplasm of digestive organs: Secondary | ICD-10-CM

## 2022-09-08 DIAGNOSIS — I480 Paroxysmal atrial fibrillation: Secondary | ICD-10-CM | POA: Diagnosis present

## 2022-09-08 DIAGNOSIS — D696 Thrombocytopenia, unspecified: Secondary | ICD-10-CM | POA: Insufficient documentation

## 2022-09-08 DIAGNOSIS — R079 Chest pain, unspecified: Secondary | ICD-10-CM | POA: Diagnosis not present

## 2022-09-08 DIAGNOSIS — Z5986 Financial insecurity: Secondary | ICD-10-CM

## 2022-09-08 DIAGNOSIS — L0390 Cellulitis, unspecified: Secondary | ICD-10-CM | POA: Diagnosis not present

## 2022-09-08 DIAGNOSIS — R609 Edema, unspecified: Secondary | ICD-10-CM | POA: Diagnosis not present

## 2022-09-08 DIAGNOSIS — N170 Acute kidney failure with tubular necrosis: Secondary | ICD-10-CM | POA: Diagnosis not present

## 2022-09-08 DIAGNOSIS — Z950 Presence of cardiac pacemaker: Secondary | ICD-10-CM

## 2022-09-08 DIAGNOSIS — Z6831 Body mass index (BMI) 31.0-31.9, adult: Secondary | ICD-10-CM

## 2022-09-08 DIAGNOSIS — I82409 Acute embolism and thrombosis of unspecified deep veins of unspecified lower extremity: Secondary | ICD-10-CM | POA: Diagnosis not present

## 2022-09-08 LAB — CBC WITH DIFFERENTIAL/PLATELET
Abs Immature Granulocytes: 0.06 K/uL (ref 0.00–0.07)
Basophils Absolute: 0 K/uL (ref 0.0–0.1)
Basophils Relative: 0 %
Eosinophils Absolute: 0.1 K/uL (ref 0.0–0.5)
Eosinophils Relative: 1 %
HCT: 44.5 % (ref 39.0–52.0)
Hemoglobin: 14.2 g/dL (ref 13.0–17.0)
Immature Granulocytes: 1 %
Lymphocytes Relative: 9 %
Lymphs Abs: 0.8 K/uL (ref 0.7–4.0)
MCH: 31 pg (ref 26.0–34.0)
MCHC: 31.9 g/dL (ref 30.0–36.0)
MCV: 97.2 fL (ref 80.0–100.0)
Monocytes Absolute: 1.1 K/uL — ABNORMAL HIGH (ref 0.1–1.0)
Monocytes Relative: 13 %
Neutro Abs: 6.4 K/uL (ref 1.7–7.7)
Neutrophils Relative %: 76 %
Platelets: 157 K/uL (ref 150–400)
RBC: 4.58 MIL/uL (ref 4.22–5.81)
RDW: 14.4 % (ref 11.5–15.5)
WBC: 8.5 K/uL (ref 4.0–10.5)
nRBC: 0 % (ref 0.0–0.2)

## 2022-09-08 LAB — COMPREHENSIVE METABOLIC PANEL WITH GFR
ALT: 14 U/L (ref 0–44)
AST: 18 U/L (ref 15–41)
Albumin: 3.1 g/dL — ABNORMAL LOW (ref 3.5–5.0)
Alkaline Phosphatase: 92 U/L (ref 38–126)
Anion gap: 11 (ref 5–15)
BUN: 39 mg/dL — ABNORMAL HIGH (ref 8–23)
CO2: 19 mmol/L — ABNORMAL LOW (ref 22–32)
Calcium: 8.8 mg/dL — ABNORMAL LOW (ref 8.9–10.3)
Chloride: 103 mmol/L (ref 98–111)
Creatinine, Ser: 2.08 mg/dL — ABNORMAL HIGH (ref 0.61–1.24)
GFR, Estimated: 30 mL/min — ABNORMAL LOW (ref >60)
Glucose, Bld: 243 mg/dL — ABNORMAL HIGH (ref 70–99)
Potassium: 3.7 mmol/L (ref 3.5–5.1)
Sodium: 133 mmol/L — ABNORMAL LOW (ref 135–145)
Total Bilirubin: 1.2 mg/dL (ref 0.3–1.2)
Total Protein: 7.3 g/dL (ref 6.5–8.1)

## 2022-09-08 LAB — GLUCOSE, CAPILLARY
Glucose-Capillary: 112 mg/dL — ABNORMAL HIGH (ref 70–99)
Glucose-Capillary: 134 mg/dL — ABNORMAL HIGH (ref 70–99)

## 2022-09-08 LAB — PROTIME-INR
INR: 1.2 (ref 0.8–1.2)
Prothrombin Time: 15.8 s — ABNORMAL HIGH (ref 11.4–15.2)

## 2022-09-08 LAB — SEDIMENTATION RATE: Sed Rate: 57 mm/hr — ABNORMAL HIGH (ref 0–20)

## 2022-09-08 LAB — LACTIC ACID, PLASMA: Lactic Acid, Venous: 1.9 mmol/L (ref 0.5–1.9)

## 2022-09-08 LAB — C-REACTIVE PROTEIN: CRP: 18.1 mg/dL — ABNORMAL HIGH (ref <1.0)

## 2022-09-08 LAB — CK: Total CK: 121 U/L (ref 49–397)

## 2022-09-08 MED ORDER — VANCOMYCIN HCL IN DEXTROSE 1-5 GM/200ML-% IV SOLN
1000.0000 mg | INTRAVENOUS | Status: DC
  Administered 2022-09-09 – 2022-09-10 (×2): 1000 mg via INTRAVENOUS
  Filled 2022-09-08 (×2): qty 200

## 2022-09-08 MED ORDER — SODIUM CHLORIDE 0.9 % IV SOLN
1.0000 g | INTRAVENOUS | Status: DC
  Administered 2022-09-08 – 2022-09-10 (×3): 1 g via INTRAVENOUS
  Filled 2022-09-08 (×3): qty 10

## 2022-09-08 MED ORDER — SODIUM CHLORIDE 0.9 % IV SOLN: INTRAVENOUS | Status: DC

## 2022-09-08 MED ORDER — ORAL CARE MOUTH RINSE: 15.0000 mL | OROMUCOSAL | Status: DC | PRN

## 2022-09-08 MED ORDER — ALBUTEROL SULFATE (2.5 MG/3ML) 0.083% IN NEBU: 2.5000 mg | INHALATION_SOLUTION | RESPIRATORY_TRACT | Status: DC | PRN

## 2022-09-08 MED ORDER — ONDANSETRON HCL 4 MG/2ML IJ SOLN
4.0000 mg | Freq: Four times a day (QID) | INTRAMUSCULAR | Status: DC | PRN
  Administered 2022-09-12: 4 mg via INTRAVENOUS
  Filled 2022-09-08: qty 2

## 2022-09-08 MED ORDER — ACETAMINOPHEN 650 MG RE SUPP: 650.0000 mg | Freq: Four times a day (QID) | RECTAL | Status: DC | PRN

## 2022-09-08 MED ORDER — MORPHINE SULFATE (PF) 4 MG/ML IV SOLN
4.0000 mg | Freq: Once | INTRAVENOUS | Status: AC
  Administered 2022-09-08: 4 mg via INTRAVENOUS
  Filled 2022-09-08: qty 1

## 2022-09-08 MED ORDER — ACETAMINOPHEN 325 MG PO TABS
650.0000 mg | ORAL_TABLET | Freq: Four times a day (QID) | ORAL | Status: DC | PRN
  Administered 2022-09-08 – 2022-09-10 (×3): 650 mg via ORAL
  Filled 2022-09-08 (×3): qty 2

## 2022-09-08 MED ORDER — SODIUM CHLORIDE 0.9 % IV SOLN
1.0000 g | Freq: Once | INTRAVENOUS | Status: AC
  Administered 2022-09-08: 1 g via INTRAVENOUS
  Filled 2022-09-08: qty 10

## 2022-09-08 MED ORDER — SODIUM CHLORIDE 0.9 % IV BOLUS
1000.0000 mL | Freq: Once | INTRAVENOUS | Status: AC
  Administered 2022-09-08: 1000 mL via INTRAVENOUS

## 2022-09-08 MED ORDER — ONDANSETRON HCL 4 MG PO TABS: 4.0000 mg | ORAL_TABLET | Freq: Four times a day (QID) | ORAL | Status: DC | PRN

## 2022-09-08 MED ORDER — VANCOMYCIN HCL 2000 MG/400ML IV SOLN
2000.0000 mg | Freq: Once | INTRAVENOUS | Status: AC
  Administered 2022-09-08: 2000 mg via INTRAVENOUS
  Filled 2022-09-08: qty 400

## 2022-09-08 MED ORDER — INSULIN ASPART 100 UNIT/ML IJ SOLN
0.0000 [IU] | Freq: Three times a day (TID) | INTRAMUSCULAR | Status: DC
  Administered 2022-09-09: 1 [IU] via SUBCUTANEOUS
  Administered 2022-09-09: 2 [IU] via SUBCUTANEOUS
  Administered 2022-09-09: 1 [IU] via SUBCUTANEOUS
  Administered 2022-09-10: 2 [IU] via SUBCUTANEOUS
  Administered 2022-09-10: 1 [IU] via SUBCUTANEOUS
  Administered 2022-09-11: 2 [IU] via SUBCUTANEOUS
  Administered 2022-09-11 – 2022-09-12 (×2): 1 [IU] via SUBCUTANEOUS
  Administered 2022-09-12 (×2): 2 [IU] via SUBCUTANEOUS
  Administered 2022-09-13 – 2022-09-14 (×2): 1 [IU] via SUBCUTANEOUS
  Administered 2022-09-14: 2 [IU] via SUBCUTANEOUS
  Administered 2022-09-15 (×2): 1 [IU] via SUBCUTANEOUS
  Administered 2022-09-15: 2 [IU] via SUBCUTANEOUS
  Administered 2022-09-16: 1 [IU] via SUBCUTANEOUS
  Filled 2022-09-08 (×16): qty 1

## 2022-09-08 NOTE — Plan of Care (Signed)

## 2022-09-08 NOTE — H&P (Signed)
History and Physical    [name]:[id number] DOB: Aug 06, 1933 DOA: 09/08/2022  PCP: [name], NP  Patient coming from: home  I have personally briefly reviewed patient's old medical records in [facility]  Chief Complaint: Right arm cellulitis x4days  HPI: [name] is a 89 y.o. male with medical history significant of DVT, Atrial fibrillation , CHF, CKDIIIb-IV , DMII, Hypertension, OSA, prosate ca, CVA , DMII , Hyperlipidemia,who presents to ED with interim history of diagnosis of cellulitis of right arm 2 days ago .  At that time patient was started on doxycycline. Per patient despite being compliant with therapy he has noted increase pain and continued swelling and redness. Due to progressive symptoms patient present to ED for further evaluation. Patient noted fever/chills and pain in right arm around elbow. He also noted that he did have an abrasion next to area of interest which he picked at  not has scab formation. He notes no other injury or trauma.    ED Course:  IN ED blood pressure is noted to be in the high 90's systolic lower than base of 120-130. Patient also noted on physical exam to have expansion of erythema above previous line marked.  Vitals: afeb, bp 96/55, hr 93, rr 18, sat 94% on ra   Wbc 8.5, hgb 14.2, plt 157,  INR Na 133, K 3.7, bicarb 19, glu 243, cr 2.08 ( 1.59) Lactic 1.9 Tx ctx,morphine Review of Systems: As per HPI otherwise 10 point review of systems negative.   Past Medical History:  Diagnosis Date   Acute deep vein thrombosis (DVT) of femoral vein of left lower extremity (HCC)    Atrial fibrillation (HCC)    CHF (congestive heart failure) (HCC)    Chronic kidney disease    Diabetes (HCC)    Hearing loss    High blood pressure    History of bladder problems    Hyperlipidemia    Kidney stones 11/22/2014   Liver cyst    OSA (obstructive sleep apnea)    Prostate cancer (HCC)    Prostate cancer (HCC)    Stroke (HCC)     Valvular heart disease     Past Surgical History:  Procedure Laterality Date   CATARACT EXTRACTION     COLONOSCOPY WITH PROPOFOL N/A 07/02/2019   Procedure: COLONOSCOPY WITH PROPOFOL;  Surgeon: Toney Reil, MD;  Location: [facility] ENDOSCOPY;  Service: Gastroenterology;  Laterality: N/A;   ESOPHAGOGASTRODUODENOSCOPY (EGD) WITH PROPOFOL N/A 03/19/2022   Procedure: ESOPHAGOGASTRODUODENOSCOPY (EGD) WITH PROPOFOL;  Surgeon: Wyline Mood, MD;  Location: [facility] ENDOSCOPY;  Service: Gastroenterology;  Laterality: N/A;   IR CT HEAD LTD  08/27/2019   IR PERCUTANEOUS ART THROMBECTOMY/INFUSION INTRACRANIAL INC DIAG ANGIO  08/27/2019       IR PERCUTANEOUS ART THROMBECTOMY/INFUSION INTRACRANIAL INC DIAG ANGIO  08/27/2019   LOWER EXTREMITY ANGIOGRAPHY Left 12/25/2020   Procedure: Lower Extremity Angiography;  Surgeon: Annice Needy, MD;  Location: [facility] INVASIVE CV LAB;  Service: Cardiovascular;  Laterality: Left;   PACEMAKER LEADLESS INSERTION N/A 12/19/2020   Procedure: PACEMAKER LEADLESS INSERTION;  Surgeon: Marcina Millard, MD;  Location: [facility] INVASIVE CV LAB;  Service: Cardiovascular;  Laterality: N/A;   PROSTATE CANCER     PROSTATE SURGERY     RADIOLOGY WITH ANESTHESIA N/A 08/27/2019   Procedure: IR WITH ANESTHESIA;  Surgeon: Julieanne Cotton, MD;  Location: [facility] OR;  Service: Radiology;  Laterality: N/A;     reports that he has quit smoking. His smoking use included  cigarettes. He has never used smokeless tobacco. He reports that he does not drink alcohol and does not use drugs.  No Known Allergies  Family History  Problem Relation Age of Onset   Colon cancer Mother    Diabetes Other    High blood pressure Other    Prostate cancer Brother    Diabetes Brother     Prior to Admission medications   Medication Sig Start Date End Date Taking? Authorizing Provider  albuterol (VENTOLIN HFA) 108 (90 Base) MCG/ACT inhaler Inhale 2 puffs into the lungs every 4 (four) hours as needed for wheezing or  shortness of breath. 03/16/21   [name], NP  atorvastatin (LIPITOR) 10 MG tablet TAKE 1 TABLET(10 MG) BY MOUTH DAILY 08/02/22   [name], NP  colchicine 0.6 MG tablet TAKE 1 TABLET BY MOUTH TWICE DAILY FOR ANKLE PAIN Patient not taking: Reported on 09/03/2022 05/20/22   Lyndon Code, MD  dapagliflozin propanediol (FARXIGA) 10 MG TABS tablet Take 1 tablet (10 mg total) by mouth daily. 08/02/22   [name], NP  edoxaban (SAVAYSA) 30 MG TABS tablet Take 30 mg by mouth daily. 11/01/21   [provider]  enzalutamide [name]) 40 MG tablet Take 4 tablets (160 mg total) by mouth daily. 07/17/22   [name], MD  ferrous sulfate 325 (65 FE) MG tablet Take 1 tablet (325 mg total) by mouth daily. Patient not taking: Reported on 09/03/2022 03/07/22   Lyndon Code, MD  furosemide (LASIX) 20 MG tablet Take 1 tablet (20 mg total) by mouth daily. 07/03/22 10/01/22  Debbe Odea, MD  glipiZIDE (GLUCOTROL XL) 2.5 MG 24 hr tablet Take 1 tablet (2.5 mg total) by mouth daily with breakfast. 08/29/22   [name], NP  glucose blood (ONETOUCH VERIO) test strip USE ONCE DAILY 02/04/22   [name], NP  metoprolol tartrate 37.5 MG TABS Take 37.5 mg by mouth 2 (two) times daily. 10/06/21   Alford Highland, MD  pantoprazole (PROTONIX) 40 MG tablet Take 1 tablet (40 mg total) by mouth daily. 06/27/22 06/27/23  Minna Antis, MD  polyethylene glycol (MIRALAX / GLYCOLAX) 17 g packet Take 17 g by mouth 2 (two) times daily. Skip the dose if no constipation 07/24/22 10/22/22  Gillis Santa, MD  SPIRIVA RESPIMAT 2.5 MCG/ACT AERS INHALE 2 PUFFS INTO THE LUNGS DAILY 06/19/22   [name], NP  sucralfate (CARAFATE) 1 g tablet Take 1 tablet (1 g total) by mouth 4 (four) times daily. Patient not taking: Reported on 09/03/2022 06/27/22 06/27/23  Minna Antis, MD  vitamin B-12 (CYANOCOBALAMIN) 1000 MCG tablet Take 1 tablet (1,000 mcg total) by mouth daily. 12/08/20   [name], NP     Physical Exam: Vitals:   09/08/22 1012 09/08/22 1015 09/08/22 1321  BP:  (!) 96/55 (!) 97/59  Pulse:  93 64  Resp:  18 20  Temp:  97.8 F (36.6 C)   TempSrc:  Oral   SpO2:  94% 98%  Weight: 109 kg    Height: 6' (1.829 m)      Constitutional: NAD, calm, comfortable Vitals:   09/08/22 1012 09/08/22 1015 09/08/22 1321  BP:  (!) 96/55 (!) 97/59  Pulse:  93 64  Resp:  18 20  Temp:  97.8 F (36.6 C)   TempSrc:  Oral   SpO2:  94% 98%  Weight: 109 kg    Height: 6' (1.829 m)     Eyes: PERRL, lids and conjunctivae normal ENMT: Mucous membranes are moist.  Posterior pharynx clear of any exudate or lesions.Normal dentition.  Neck: normal, supple, no masses, no thyromegaly Respiratory: clear to auscultation bilaterally, no wheezing, no crackles. Normal respiratory effort. No accessory muscle use.  Cardiovascular: Regular rate and rhythm, no murmurs / rubs / gallops. trace extremity edema. 2+ pedal pulses Abdomen: no tenderness, no masses palpated. No hepatosplenomegaly. Bowel sounds positive.  Musculoskeletal: no clubbing / cyanosis. No joint deformity upper and lower extremities. Good ROM, no contractures. Normal muscle tone.  Skin: right forearm  elbow  area noted to be swollen warm with area of induration, noted tender to tough. Area of skin injury on forearm also noted Neurologic: CN 2-12 grossly intact. Sensation intact Strength 5/5 in all 4.  Psychiatric: Normal judgment and insight. Alert and oriented x 3. Normal mood.    Labs on Admission: I have personally reviewed following labs and imaging studies  CBC: Recent Labs  Lab 09/08/22 1013  WBC 8.5  NEUTROABS 6.4  HGB 14.2  HCT 44.5  MCV 97.2  PLT 157   Basic Metabolic Panel: Recent Labs  Lab 09/08/22 1013  NA 133*  K 3.7  CL 103  CO2 19*  GLUCOSE 243*  BUN 39*  CREATININE 2.08*  CALCIUM 8.8*   GFR: Estimated Creatinine Clearance: 31.9 mL/min (A) (by C-G formula based on SCr of 2.08 mg/dL (H)). Liver  Function Tests: Recent Labs  Lab 09/08/22 1013  AST 18  ALT 14  ALKPHOS 92  BILITOT 1.2  PROT 7.3  ALBUMIN 3.1*   No results for input(s): "LIPASE", "AMYLASE" in the last 168 hours. No results for input(s): "AMMONIA" in the last 168 hours. Coagulation Profile: Recent Labs  Lab 09/08/22 1013  INR 1.2   Cardiac Enzymes: No results for input(s): "CKTOTAL", "CKMB", "CKMBINDEX", "TROPONINI" in the last 168 hours. BNP (last 3 results) No results for input(s): "PROBNP" in the last 8760 hours. HbA1C: No results for input(s): "HGBA1C" in the last 72 hours. CBG: No results for input(s): "GLUCAP" in the last 168 hours. Lipid Profile: No results for input(s): "CHOL", "HDL", "LDLCALC", "TRIG", "CHOLHDL", "LDLDIRECT" in the last 72 hours. Thyroid Function Tests: No results for input(s): "TSH", "T4TOTAL", "FREET4", "T3FREE", "THYROIDAB" in the last 72 hours. Anemia Panel: No results for input(s): "VITAMINB12", "FOLATE", "FERRITIN", "TIBC", "IRON", "RETICCTPCT" in the last 72 hours. Urine analysis:    Component Value Date/Time   COLORURINE STRAW (A) 10/03/2021 2308   APPEARANCEUR Clear 12/20/2021 1615   LABSPEC 1.010 10/03/2021 2308   LABSPEC 1.015 10/12/2013 1445   PHURINE 5.0 10/03/2021 2308   GLUCOSEU 3+ (A) 12/20/2021 1615   GLUCOSEU 50 mg/dL 08/65/7846 9629   HGBUR NEGATIVE 10/03/2021 2308   BILIRUBINUR Negative 12/20/2021 1615   BILIRUBINUR Negative 10/12/2013 1445   KETONESUR NEGATIVE 10/03/2021 2308   PROTEINUR Negative 12/20/2021 1615   PROTEINUR NEGATIVE 10/03/2021 2308   UROBILINOGEN 0.2 06/29/2019 1556   NITRITE Negative 12/20/2021 1615   NITRITE NEGATIVE 10/03/2021 2308   LEUKOCYTESUR Negative 12/20/2021 1615   LEUKOCYTESUR NEGATIVE 10/03/2021 2308   LEUKOCYTESUR Negative 10/12/2013 1445    Radiological Exams on Admission: No results found.  EKG: Independently reviewed.  Assessment/Plan  Progressive Cellulitis of Right forearm  with concern for failure of  outpatient antibiotic  -  admit to med tele  -start on CTX / vanc  - mrsa pcr pending  - supportive care with pain medication as well as ivfs  -CT pending  AKI on CKDIIIb-IV -give gently ivfs  - hold lasix,fraxiga  -hold nephrotoxic medications  Permanent Atrial fibrillation -hx of tachybrady syndrome  -s/p lead less pacemaker  - continue with metoprolol and edoxaban  Hx of DVT -on OAC   CHF -hold farxiga, lasix, metoprolol due to low bp  -resume as bp/renal function  tolerates   DMII -with hyperglycemia  -iss/fs -not on insulin at home  -resume oral hypoglycemic in am as tolearted    Hypertension -now in setting of dehydration and infection with relative hypotension  -resume home regimen as needed    OSA -at bedtime O2    Prosate ca   CVA  -continue on secondary ppx with edoxaban    DVT prophylaxis: on OAC Code Status: full/ as discussed per patient wishes in event of cardiac arrest  Family Communication: .none at bedside Disposition Plan: patient  expected to be admitted greater than 2 midnights  Consults called: n/a Admission status: med tele   Lurline Del MD [facility]  If 7PM-7AM, please contact night-coverage [email or web address] Password [facility]  09/08/2022, 2:13 PM

## 2022-09-08 NOTE — Progress Notes (Signed)
Pharmacy Antibiotic Note  [name] is a 89 y.o. male admitted on 09/08/2022 with cellulitis.  Pharmacy has been consulted for Vancomycin dosing.  Plan: Pt given Vancomycin 2000 mg once. Vancomycin 1000 mg IV Q 24 hrs. Goal AUC 400-550. Expected AUC: 468.6 SCr used: 2.08  Pharmacy will continue to follow and will adjust abx dosing whenever warranted.  Temp (24hrs), Avg:97.8 F (36.6 C), Min:97.7 F (36.5 C), Max:97.8 F (36.6 C)   Recent Labs  Lab 09/08/22 1013 09/08/22 1016  WBC 8.5  --   CREATININE 2.08*  --   LATICACIDVEN  --  1.9    Estimated Creatinine Clearance: 31.9 mL/min (A) (by C-G formula based on SCr of 2.08 mg/dL (H)).    No Known Allergies  Antimicrobials this admission: 8/18 Ceftriaxone >>  8/18 Vancomycin >>   Microbiology results: 8/18 BCx: Pending 8/18 Sputum: Pending   Thank you for allowing pharmacy to be a part of this patient's care.  Otelia Sergeant, PharmD, [facility] 09/08/2022 9:34 PM

## 2022-09-08 NOTE — ED Triage Notes (Signed)
Pt to ED for redness and swelling to lateral R forearm and upper arm that he noticed 2 days ago. Unknown if any injury or trauma. Appears to be cellulitis. Hx DM. Pt in NAD. No pain at this time.

## 2022-09-08 NOTE — ED Provider Notes (Signed)
[facility] Provider Note    Event Date/Time   First MD Initiated Contact with Patient 09/08/22 1248     (approximate)   History   Cellulitis   HPI [name] is a 89 y.o. male presenting today for right arm cellulitis.  Patient notes symptoms started several days ago and was seen in the clinic 2 days ago.  Provider saw him and diagnosed him with cellulitis and started him on doxycycline.  Patient reports taking the pills as scheduled over the past 48 hours without improvement.  He noticed worsening arm pain of his right elbow along with worsening swelling and redness.  He started having chills as well.  Patient is at his normal mentation status per wife and does seem intermittently confused at the bedside.  Blood pressure was found to be low in triage and patient notes this is lower than his baseline.     Physical Exam   Triage Vital Signs: ED Triage Vitals  Encounter Vitals Group     BP 09/08/22 1015 (!) 96/55     Systolic BP Percentile --      Diastolic BP Percentile --      Pulse Rate 09/08/22 1015 93     Resp 09/08/22 1015 18     Temp 09/08/22 1015 97.8 F (36.6 C)     Temp Source 09/08/22 1015 Oral     SpO2 09/08/22 1015 94 %     Weight 09/08/22 1012 240 lb 4.8 oz (109 kg)     Height 09/08/22 1012 6' (1.829 m)     Head Circumference --      Peak Flow --      Pain Score 09/08/22 1011 0     Pain Loc --      Pain Education --      Exclude from Growth Chart --     Most recent vital signs: Vitals:   09/08/22 1321 09/08/22 1415  BP: (!) 97/59   Pulse: 64   Resp: 20   Temp:  97.7 F (36.5 C)  SpO2: 98%    I have reviewed the vital signs. General:  Awake, alert, no acute distress. Head:  Normocephalic, Atraumatic. EENT:  PERRL, EOMI, Oral mucosa pink and moist, Neck is supple. Cardiovascular: Regular rate, 2+ distal pulses. Respiratory:  Normal respiratory effort, symmetrical expansion, no distress.   Extremities: Pain with ROM  of right elbow. Neuro:  Alert and oriented.  Interacting appropriately.   Skin: Edema, erythema, and warmth to right elbow.  Previously marked area of cellulitis with erythema extending outside those boundaries. Psych: Appropriate affect.     ED Results / Procedures / Treatments   Labs (all labs ordered are listed, but only abnormal results are displayed) Labs Reviewed  COMPREHENSIVE METABOLIC PANEL - Abnormal; Notable for the following components:      Result Value   Sodium 133 (*)    CO2 19 (*)    Glucose, Bld 243 (*)    BUN 39 (*)    Creatinine, Ser 2.08 (*)    Calcium 8.8 (*)    Albumin 3.1 (*)    GFR, Estimated 30 (*)    All other components within normal limits  CBC WITH DIFFERENTIAL/PLATELET - Abnormal; Notable for the following components:   Monocytes Absolute 1.1 (*)    All other components within normal limits  PROTIME-INR - Abnormal; Notable for the following components:   Prothrombin Time 15.8 (*)    All other components within normal  limits  CULTURE, BLOOD (ROUTINE X 2)  CULTURE, BLOOD (ROUTINE X 2)  LACTIC ACID, PLASMA  SEDIMENTATION RATE  C-REACTIVE PROTEIN     EKG    RADIOLOGY    PROCEDURES:  Critical Care performed: No  Procedures   MEDICATIONS ORDERED IN ED: Medications  sodium chloride 0.9 % bolus 1,000 mL (1,000 mLs Intravenous New Bag/Given 09/08/22 1326)  cefTRIAXone (ROCEPHIN) 1 g in sodium chloride 0.9 % 100 mL IVPB (0 g Intravenous Stopped 09/08/22 1350)  morphine (PF) 4 MG/ML injection 4 mg (4 mg Intravenous Given 09/08/22 1349)     IMPRESSION / MDM / ASSESSMENT AND PLAN / ED COURSE  I reviewed the triage vital signs and the nursing notes.                              Differential diagnosis includes, but is not limited to, cellulitis failing outpatient antibiotics, septic joint, sepsis, Lyme disease.  Patient's presentation is most consistent with acute presentation with potential threat to life or bodily function.  Patient  is an 89 year old male presenting today for worsening right elbow pain, swelling, and erythema after being on outpatient antibiotics for 2 days for cellulitis.  Patient does have hypotension here today which she reports is new over the past couple of days.  He also has some mild confusion in the room which wife does state is his baseline but is confusing to follow a complete story.  No leukocytosis present.  Given worsening pain, swelling, and erythema symptoms that now spread from the initial marked area 2 days ago I am concerned for possible failure or no improvement with oral antibiotics.  Will admit patient for IV antibiotics and rehydration.  Hospitalist is agreed to admit patient for further care.  The patient is on the cardiac monitor to evaluate for evidence of arrhythmia and/or significant heart rate changes.     FINAL CLINICAL IMPRESSION(S) / ED DIAGNOSES   Final diagnoses:  Cellulitis of right upper extremity  Hypotension, unspecified hypotension type     Rx / DC Orders   ED Discharge Orders     None        Note:  This document was prepared using Dragon voice recognition software and may include unintentional dictation errors.   Janith Lima, MD 09/08/22 [phone]

## 2022-09-09 ENCOUNTER — Inpatient Hospital Stay: Payer: Medicare Other

## 2022-09-09 ENCOUNTER — Encounter: Payer: Self-pay | Admitting: Internal Medicine

## 2022-09-09 DIAGNOSIS — L03114 Cellulitis of left upper limb: Secondary | ICD-10-CM

## 2022-09-09 DIAGNOSIS — N1831 Chronic kidney disease, stage 3a: Secondary | ICD-10-CM | POA: Diagnosis not present

## 2022-09-09 DIAGNOSIS — E872 Acidosis, unspecified: Secondary | ICD-10-CM | POA: Insufficient documentation

## 2022-09-09 DIAGNOSIS — E1165 Type 2 diabetes mellitus with hyperglycemia: Secondary | ICD-10-CM | POA: Diagnosis not present

## 2022-09-09 DIAGNOSIS — N170 Acute kidney failure with tubular necrosis: Secondary | ICD-10-CM

## 2022-09-09 DIAGNOSIS — D696 Thrombocytopenia, unspecified: Secondary | ICD-10-CM | POA: Insufficient documentation

## 2022-09-09 LAB — GLUCOSE, CAPILLARY
Glucose-Capillary: 200 mg/dL — ABNORMAL HIGH (ref 70–99)
Glucose-Capillary: 149 mg/dL — ABNORMAL HIGH (ref 70–99)
Glucose-Capillary: 131 mg/dL — ABNORMAL HIGH (ref 70–99)
Glucose-Capillary: 126 mg/dL — ABNORMAL HIGH (ref 70–99)

## 2022-09-09 LAB — CBC
HCT: 40.3 % (ref 39.0–52.0)
Hemoglobin: 13 g/dL (ref 13.0–17.0)
MCH: 31 pg (ref 26.0–34.0)
MCHC: 32.3 g/dL (ref 30.0–36.0)
MCV: 96 fL (ref 80.0–100.0)
Platelets: 148 10*3/uL — ABNORMAL LOW (ref 150–400)
RBC: 4.2 MIL/uL — ABNORMAL LOW (ref 4.22–5.81)
RDW: 14.2 % (ref 11.5–15.5)
WBC: 7.5 10*3/uL (ref 4.0–10.5)
nRBC: 0 % (ref 0.0–0.2)

## 2022-09-09 LAB — COMPREHENSIVE METABOLIC PANEL
ALT: 13 U/L (ref 0–44)
AST: 15 U/L (ref 15–41)
Albumin: 2.7 g/dL — ABNORMAL LOW (ref 3.5–5.0)
Alkaline Phosphatase: 87 U/L (ref 38–126)
Anion gap: 11 (ref 5–15)
BUN: 37 mg/dL — ABNORMAL HIGH (ref 8–23)
CO2: 21 mmol/L — ABNORMAL LOW (ref 22–32)
Calcium: 8.7 mg/dL — ABNORMAL LOW (ref 8.9–10.3)
Chloride: 107 mmol/L (ref 98–111)
Creatinine, Ser: 1.85 mg/dL — ABNORMAL HIGH (ref 0.61–1.24)
GFR, Estimated: 35 mL/min — ABNORMAL LOW (ref >60)
Glucose, Bld: 136 mg/dL — ABNORMAL HIGH (ref 70–99)
Potassium: 3.5 mmol/L (ref 3.5–5.1)
Sodium: 139 mmol/L (ref 135–145)
Total Bilirubin: 1.2 mg/dL (ref 0.3–1.2)
Total Protein: 6.5 g/dL (ref 6.5–8.1)

## 2022-09-09 LAB — PROTIME-INR
INR: 1.3 — ABNORMAL HIGH (ref 0.8–1.2)
Prothrombin Time: 16 s — ABNORMAL HIGH (ref 11.4–15.2)

## 2022-09-09 MED ORDER — IOHEXOL 300 MG/ML  SOLN
70.0000 mL | Freq: Once | INTRAMUSCULAR | Status: AC | PRN
  Administered 2022-09-09: 70 mL via INTRAVENOUS

## 2022-09-09 MED ORDER — EDOXABAN TOSYLATE 30 MG PO TABS
30.0000 mg | ORAL_TABLET | ORAL | Status: DC
  Administered 2022-09-09 – 2022-09-16 (×7): 30 mg via ORAL
  Filled 2022-09-09 (×8): qty 1

## 2022-09-09 NOTE — Progress Notes (Signed)
  Progress Note   Patient: [name] HYQ:[id number] DOB: 08-Feb-1933 DOA: 09/08/2022     1 DOS: the patient was seen and examined on 09/09/2022   Brief hospital course: [name] is a 89 y.o. male with medical history significant of DVT, Atrial fibrillation , CHF, CKDIIIb-IV , DMII, Hypertension, OSA, prosate ca, CVA , DMII , Hyperlipidemia,who presents to ED with interim history of diagnosis of cellulitis of right arm 2 days ago .  Patient is treated with vancomycin and Rocephin.  CT scan of the right arm did not show any evidence of active abscess.   Principal Problem:   Cellulitis of left elbow Active Problems:   Acute renal failure superimposed on stage 3a chronic kidney disease (HCC)   Uncontrolled type 2 diabetes mellitus with hyperglycemia (HCC)   Type II diabetes mellitus with renal manifestations (HCC)   Metabolic acidosis   Thrombocytopenia (HCC)   Assessment and Plan: Cellulitis of right forearm. Patient failed outpatient treatment.  CT scan did not show any abscess or elbow joint involvement. Continue current antibiotics with Rocephin and vancomycin.  Acute renal failure superimposed on stage III chronic kidney disease. Mild metabolic acidosis. Reviewed prior chart, patient had a chronic kidney disease stage IIIa.  Renal functions better after fluids.  Discontinue fluids for now.  Continue to monitor.  Paroxysmal atrial fibrillation. History of CVA. Continue anticoagulation and metoprolol.  Chronic diastolic congestive heart failure. Stable, no exacerbation.  Obesity of the BMI 31.41. Diet and exercise.  Obstruct sleep apnea. On nocturnal oxygen.  Uncontrolled type 2 diabetes with hyperglycemia  Continue to follow.    Subjective:  Patient still has some right arm pain, but overall feels like the swelling is coming down slightly.  Physical Exam: Vitals:   09/08/22 2337 09/09/22 0500 09/09/22 0938 09/09/22 1505  BP: 131/88  124/85 136/86   Pulse: (!) 102  63 (!) 51  Resp: 20  14 16   Temp: 97.7 F (36.5 C)  98.3 F (36.8 C) 99.2 F (37.3 C)  TempSrc:      SpO2: 94%  99% 97%  Weight:  105 kg    Height:       General exam: Appears calm and comfortable  Respiratory system: Clear to auscultation. Respiratory effort normal. Cardiovascular system: S1 & S2 heard, RRR. No JVD, murmurs, rubs, gallops or clicks. No pedal edema. Gastrointestinal system: Abdomen is nondistended, soft and nontender. No organomegaly or masses felt. Normal bowel sounds heard. Central nervous system: Alert and oriented. No focal neurological deficits. Extremities: Right arm red and swelling. Skin: No rashes, lesions or ulcers Psychiatry: Judgement and insight appear normal. Mood & affect appropriate.    Data Reviewed:  CT scan, lab results reviewed.  Family Communication: Wife updated at the bedside.  Disposition: Status is: Inpatient Remains inpatient appropriate because: Severity of disease, IV treatment.     Time spent: 35 minutes  Author: Marrion Coy, MD 09/09/2022 4:13 PM  For on call review [email or web address].

## 2022-09-09 NOTE — Progress Notes (Signed)
Pharmacy Antibiotic Note  [name] is a 89 y.o. male admitted on 09/08/2022 with cellulitis. PMH significant for T2DM, CKD3b, thrombocytopenia, HTN, AF, CHF, OA. He was diagnosed with cellulitis 8/16 and prescribed doxycycline. Per patient, despite being compliant with therapy, he has noted increase pain and continued swelling and redness. Pharmacy has been consulted for vancomycin dosing.  Plan: Day 2 of antibiotics Continue vancomycin 1000 mg IV Q24H. Goal AUC 400-550. Expected AUC: 440.5 Expected Css min: 12.9 SCr used: 1.85  Weight used: IBW, Vd used: 0.72 (BMI 31.3) Patient is also on ceftriaxone 1 g IV Q24H Continue to monitor renal function and follow culture results   Temp (24hrs), Avg:97.7 F (36.5 C), Min:97.7 F (36.5 C), Max:97.8 F (36.6 C)   Recent Labs  Lab 09/08/22 1013 09/08/22 1016 09/09/22 0436  WBC 8.5  --  7.5  CREATININE 2.08*  --  1.85*  LATICACIDVEN  --  1.9  --     Estimated Creatinine Clearance: 35.3 mL/min (A) (by C-G formula based on SCr of 1.85 mg/dL (H)).    No Known Allergies  Antimicrobials this admission: 8/18 Ceftriaxone >>  8/18 Vancomycin >>   Microbiology results: 8/18 BCx: NG<24H 8/18 Sputum: ordered   Thank you for allowing pharmacy to be a part of this patient's care.  Celene Squibb, PharmD Clinical Pharmacist 09/09/2022 8:50 AM

## 2022-09-09 NOTE — Plan of Care (Signed)
  Problem: Education: Goal: Knowledge of General Education information will improve Description Including pain rating scale, medication(s)/side effects and non-pharmacologic comfort measures Outcome: Progressing   

## 2022-09-09 NOTE — Plan of Care (Signed)

## 2022-09-09 NOTE — Hospital Course (Signed)
[name] is a 89 y.o. male with medical history significant of DVT, Atrial fibrillation , CHF, CKDIIIb-IV , DMII, Hypertension, OSA, prosate ca, CVA , DMII , Hyperlipidemia,who presents to ED with interim history of diagnosis of cellulitis of right arm 2 days ago .  Patient is treated with vancomycin and Rocephin.  CT scan of the right arm did not show any evidence of active abscess.

## 2022-09-10 ENCOUNTER — Other Ambulatory Visit (HOSPITAL_COMMUNITY): Payer: Self-pay

## 2022-09-10 DIAGNOSIS — N170 Acute kidney failure with tubular necrosis: Secondary | ICD-10-CM | POA: Diagnosis not present

## 2022-09-10 DIAGNOSIS — L03113 Cellulitis of right upper limb: Principal | ICD-10-CM | POA: Insufficient documentation

## 2022-09-10 DIAGNOSIS — E872 Acidosis, unspecified: Secondary | ICD-10-CM | POA: Diagnosis not present

## 2022-09-10 DIAGNOSIS — N1831 Chronic kidney disease, stage 3a: Secondary | ICD-10-CM | POA: Diagnosis not present

## 2022-09-10 LAB — COMPREHENSIVE METABOLIC PANEL
ALT: 13 U/L (ref 0–44)
AST: 15 U/L (ref 15–41)
Albumin: 2.9 g/dL — ABNORMAL LOW (ref 3.5–5.0)
Alkaline Phosphatase: 104 U/L (ref 38–126)
Anion gap: 9 (ref 5–15)
BUN: 32 mg/dL — ABNORMAL HIGH (ref 8–23)
CO2: 21 mmol/L — ABNORMAL LOW (ref 22–32)
Calcium: 8.9 mg/dL (ref 8.9–10.3)
Chloride: 108 mmol/L (ref 98–111)
Creatinine, Ser: 1.74 mg/dL — ABNORMAL HIGH (ref 0.61–1.24)
GFR, Estimated: 37 mL/min — ABNORMAL LOW (ref >60)
Glucose, Bld: 143 mg/dL — ABNORMAL HIGH (ref 70–99)
Potassium: 3.6 mmol/L (ref 3.5–5.1)
Sodium: 138 mmol/L (ref 135–145)
Total Bilirubin: 1.2 mg/dL (ref 0.3–1.2)
Total Protein: 7.3 g/dL (ref 6.5–8.1)

## 2022-09-10 LAB — GLUCOSE, CAPILLARY
Glucose-Capillary: 138 mg/dL — ABNORMAL HIGH (ref 70–99)
Glucose-Capillary: 185 mg/dL — ABNORMAL HIGH (ref 70–99)
Glucose-Capillary: 89 mg/dL (ref 70–99)

## 2022-09-10 LAB — CBC
HCT: 41.9 % (ref 39.0–52.0)
Hemoglobin: 13.9 g/dL (ref 13.0–17.0)
MCH: 31.4 pg (ref 26.0–34.0)
MCHC: 33.2 g/dL (ref 30.0–36.0)
MCV: 94.6 fL (ref 80.0–100.0)
Platelets: 182 10*3/uL (ref 150–400)
RBC: 4.43 MIL/uL (ref 4.22–5.81)
RDW: 14.3 % (ref 11.5–15.5)
WBC: 10.6 10*3/uL — ABNORMAL HIGH (ref 4.0–10.5)
nRBC: 0 % (ref 0.0–0.2)

## 2022-09-10 LAB — MAGNESIUM: Magnesium: 2.2 mg/dL (ref 1.7–2.4)

## 2022-09-10 MED ORDER — SODIUM BICARBONATE 650 MG PO TABS
650.0000 mg | ORAL_TABLET | Freq: Two times a day (BID) | ORAL | Status: DC
  Administered 2022-09-10 – 2022-09-16 (×13): 650 mg via ORAL
  Filled 2022-09-10 (×14): qty 1

## 2022-09-10 NOTE — Plan of Care (Signed)

## 2022-09-10 NOTE — Progress Notes (Signed)
  Progress Note   Patient: [name] PYK:[id number] DOB: 07-Jul-1933 DOA: 09/08/2022     2 DOS: the patient was seen and examined on 09/10/2022   Brief hospital course: [name] is a 89 y.o. male with medical history significant of DVT, Atrial fibrillation , CHF, CKDIIIb-IV , DMII, Hypertension, OSA, prosate ca, CVA , DMII , Hyperlipidemia,who presents to ED with interim history of diagnosis of cellulitis of right arm 2 days ago .  Patient is treated with vancomycin and Rocephin.  CT scan of the right arm did not show any evidence of active abscess.   Active Problems:   Acute renal failure superimposed on stage 3a chronic kidney disease (HCC)   Uncontrolled type 2 diabetes mellitus with hyperglycemia (HCC)   Type II diabetes mellitus with renal manifestations (HCC)   Metabolic acidosis   Thrombocytopenia (HCC)   Cellulitis of right arm   Assessment and Plan: Cellulitis of right forearm. Patient failed outpatient treatment.  CT scan did not show any abscess or elbow joint involvement. Continue current antibiotics with Rocephin and vancomycin. Blood cultures so far negative.  Patient right arm still has significant swelling, but less tender.  Patient appears to be slowly improving.   Acute renal failure superimposed on stage III chronic kidney disease. Mild metabolic acidosis. Reviewed prior chart, patient had a chronic kidney disease stage IIIa.  Renal functions better after fluids. .   Paroxysmal atrial fibrillation. History of CVA. Continue anticoagulation and metoprolol.   Chronic diastolic congestive heart failure. Stable, no exacerbation.   Obesity of the BMI 31.41. Diet and exercise.   Obstruct sleep apnea. On nocturnal oxygen.   Uncontrolled type 2 diabetes with hyperglycemia  Glucose better controlled today.      Subjective: Still has right arm swelling and pain.  But seems to be slowly improving.  Physical Exam: Vitals:   09/09/22 0938 09/09/22  1505 09/09/22 2301 09/10/22 0811  BP: 124/85 136/86 128/67 115/79  Pulse: 63 (!) 51 79 64  Resp: 14 16 16 16   Temp: 98.3 F (36.8 C) 99.2 F (37.3 C) 98.5 F (36.9 C) 98.1 F (36.7 C)  TempSrc:      SpO2: 99% 97% 97% 96%  Weight:      Height:       General exam: Appears calm and comfortable  Respiratory system: Clear to auscultation. Respiratory effort normal. Cardiovascular system: S1 & S2 heard, RRR. No JVD, murmurs, rubs, gallops or clicks. No pedal edema. Gastrointestinal system: Abdomen is nondistended, soft and nontender. No organomegaly or masses felt. Normal bowel sounds heard. Central nervous system: Alert and oriented. No focal neurological deficits. Extremities: Right arm swelling and tender. Skin: No rashes, lesions or ulcers Psychiatry: Judgement and insight appear normal. Mood & affect appropriate.    Data Reviewed:  Lab results reviewed.  Family Communication: Wife updated at the bedside.  Disposition: Status is: Inpatient Remains inpatient appropriate because: Severity of disease, IV treatment.     Time spent: 35 minutes  Author: Marrion Coy, MD 09/10/2022 11:03 AM  For on call review [email or web address].

## 2022-09-10 NOTE — Plan of Care (Signed)
  Problem: Health Behavior/Discharge Planning: Goal: Ability to manage health-related needs will improve Outcome: Progressing   Problem: Clinical Measurements: Goal: Diagnostic test results will improve Outcome: Progressing   Problem: Activity: Goal: Risk for activity intolerance will decrease Outcome: Progressing   Problem: Nutrition: Goal: Adequate nutrition will be maintained Outcome: Progressing   Problem: Elimination: Goal: Will not experience complications related to bowel motility Outcome: Progressing Goal: Will not experience complications related to urinary retention Outcome: Progressing   Problem: Pain Managment: Goal: General experience of comfort will improve Outcome: Progressing   Problem: Safety: Goal: Ability to remain free from injury will improve Outcome: Progressing

## 2022-09-11 ENCOUNTER — Inpatient Hospital Stay: Payer: Medicare Other

## 2022-09-11 DIAGNOSIS — E1122 Type 2 diabetes mellitus with diabetic chronic kidney disease: Secondary | ICD-10-CM | POA: Diagnosis not present

## 2022-09-11 DIAGNOSIS — N1832 Chronic kidney disease, stage 3b: Secondary | ICD-10-CM

## 2022-09-11 DIAGNOSIS — Z794 Long term (current) use of insulin: Secondary | ICD-10-CM

## 2022-09-11 DIAGNOSIS — L03113 Cellulitis of right upper limb: Secondary | ICD-10-CM | POA: Diagnosis not present

## 2022-09-11 DIAGNOSIS — N1831 Chronic kidney disease, stage 3a: Secondary | ICD-10-CM | POA: Diagnosis not present

## 2022-09-11 DIAGNOSIS — N170 Acute kidney failure with tubular necrosis: Secondary | ICD-10-CM | POA: Diagnosis not present

## 2022-09-11 LAB — COMPREHENSIVE METABOLIC PANEL
ALT: 11 U/L (ref 0–44)
AST: 15 U/L (ref 15–41)
Albumin: 2.7 g/dL — ABNORMAL LOW (ref 3.5–5.0)
Alkaline Phosphatase: 93 U/L (ref 38–126)
Anion gap: 7 (ref 5–15)
BUN: 31 mg/dL — ABNORMAL HIGH (ref 8–23)
CO2: 22 mmol/L (ref 22–32)
Calcium: 8.5 mg/dL — ABNORMAL LOW (ref 8.9–10.3)
Chloride: 107 mmol/L (ref 98–111)
Creatinine, Ser: 1.57 mg/dL — ABNORMAL HIGH (ref 0.61–1.24)
GFR, Estimated: 42 mL/min — ABNORMAL LOW (ref >60)
Glucose, Bld: 138 mg/dL — ABNORMAL HIGH (ref 70–99)
Potassium: 3.6 mmol/L (ref 3.5–5.1)
Sodium: 136 mmol/L (ref 135–145)
Total Bilirubin: 1.2 mg/dL (ref 0.3–1.2)
Total Protein: 7 g/dL (ref 6.5–8.1)

## 2022-09-11 LAB — CBC
HCT: 39.5 % (ref 39.0–52.0)
Hemoglobin: 13.3 g/dL (ref 13.0–17.0)
MCH: 31.3 pg (ref 26.0–34.0)
MCHC: 33.7 g/dL (ref 30.0–36.0)
MCV: 92.9 fL (ref 80.0–100.0)
Platelets: 186 10*3/uL (ref 150–400)
RBC: 4.25 MIL/uL (ref 4.22–5.81)
RDW: 14.1 % (ref 11.5–15.5)
WBC: 9.2 10*3/uL (ref 4.0–10.5)
nRBC: 0 % (ref 0.0–0.2)

## 2022-09-11 LAB — GLUCOSE, CAPILLARY
Glucose-Capillary: 158 mg/dL — ABNORMAL HIGH (ref 70–99)
Glucose-Capillary: 110 mg/dL — ABNORMAL HIGH (ref 70–99)
Glucose-Capillary: 145 mg/dL — ABNORMAL HIGH (ref 70–99)
Glucose-Capillary: 136 mg/dL — ABNORMAL HIGH (ref 70–99)

## 2022-09-11 LAB — MAGNESIUM: Magnesium: 2.2 mg/dL (ref 1.7–2.4)

## 2022-09-11 MED ORDER — PIPERACILLIN-TAZOBACTAM 3.375 G IVPB
3.3750 g | Freq: Three times a day (TID) | INTRAVENOUS | Status: DC
  Administered 2022-09-11 – 2022-09-13 (×8): 3.375 g via INTRAVENOUS
  Filled 2022-09-11 (×9): qty 50

## 2022-09-11 MED ORDER — HYDROMORPHONE HCL 1 MG/ML IJ SOLN
0.5000 mg | INTRAMUSCULAR | Status: DC | PRN
  Administered 2022-09-12 – 2022-09-13 (×3): 0.5 mg via INTRAVENOUS
  Filled 2022-09-11 (×3): qty 0.5

## 2022-09-11 MED ORDER — OXYCODONE-ACETAMINOPHEN 5-325 MG PO TABS
1.0000 | ORAL_TABLET | ORAL | Status: DC | PRN
  Administered 2022-09-11: 1 via ORAL
  Filled 2022-09-11 (×2): qty 1

## 2022-09-11 MED ORDER — VANCOMYCIN HCL 1250 MG/250ML IV SOLN
1250.0000 mg | INTRAVENOUS | Status: DC
  Administered 2022-09-11: 1250 mg via INTRAVENOUS
  Filled 2022-09-11 (×2): qty 250

## 2022-09-11 NOTE — Consult Note (Signed)
ORTHOPAEDIC CONSULTATION  REQUESTING PHYSICIAN: Marrion Coy, MD  Chief Complaint:   L forearm, elbow pain  History of Present Illness: [name] is a 89 y.o. right-handed male Who was admitted on 09/08/2022 for presumed left forearm cellulitis after he failed outpatient management with oral antibiotics.  He was started on broad-spectrum antibiotics.  CT scan at that time was negative for abscess.  He cannot obtain an MRI due to pacemaker.  He was experiencing some improvement in his erythema, pain, and swelling until yesterday, when he began to develop worsening pain and increased erythema.  He is right.  He works a Office manager.  He does have a medical history significant for CKD, A-fib and history of DVT on edoxaban, pacemaker placement, CHF, diabetes, hypertension, and OSA.  He has also had prior stroke.  Past Medical History:  Diagnosis Date   Acute deep vein thrombosis (DVT) of femoral vein of left lower extremity (HCC)    Atrial fibrillation (HCC)    CHF (congestive heart failure) (HCC)    Chronic kidney disease    Diabetes (HCC)    Hearing loss    High blood pressure    History of bladder problems    Hyperlipidemia    Kidney stones 11/22/2014   Liver cyst    OSA (obstructive sleep apnea)    Prostate cancer (HCC)    Prostate cancer (HCC)    Stroke (HCC)    Valvular heart disease    Past Surgical History:  Procedure Laterality Date   CATARACT EXTRACTION     COLONOSCOPY WITH PROPOFOL N/A 07/02/2019   Procedure: COLONOSCOPY WITH PROPOFOL;  Surgeon: Toney Reil, MD;  Location: [facility] ENDOSCOPY;  Service: Gastroenterology;  Laterality: N/A;   ESOPHAGOGASTRODUODENOSCOPY (EGD) WITH PROPOFOL N/A 03/19/2022   Procedure: ESOPHAGOGASTRODUODENOSCOPY (EGD) WITH PROPOFOL;  Surgeon: Wyline Mood, MD;  Location: [facility] ENDOSCOPY;  Service: Gastroenterology;  Laterality: N/A;   IR CT HEAD LTD  08/27/2019   IR PERCUTANEOUS  ART THROMBECTOMY/INFUSION INTRACRANIAL INC DIAG ANGIO  08/27/2019       IR PERCUTANEOUS ART THROMBECTOMY/INFUSION INTRACRANIAL INC DIAG ANGIO  08/27/2019   LOWER EXTREMITY ANGIOGRAPHY Left 12/25/2020   Procedure: Lower Extremity Angiography;  Surgeon: Annice Needy, MD;  Location: [facility] INVASIVE CV LAB;  Service: Cardiovascular;  Laterality: Left;   PACEMAKER LEADLESS INSERTION N/A 12/19/2020   Procedure: PACEMAKER LEADLESS INSERTION;  Surgeon: Marcina Millard, MD;  Location: [facility] INVASIVE CV LAB;  Service: Cardiovascular;  Laterality: N/A;   PROSTATE CANCER     PROSTATE SURGERY     RADIOLOGY WITH ANESTHESIA N/A 08/27/2019   Procedure: IR WITH ANESTHESIA;  Surgeon: Julieanne Cotton, MD;  Location: [facility] OR;  Service: Radiology;  Laterality: N/A;   Social History   Socioeconomic History   Marital status: Married    Spouse name: Lindsey Gruba   Number of children: 2   Years of education: 12   Highest education level: 12th grade  Occupational History   Occupation: Airport  Tobacco Use   Smoking status: Former    Types: Cigarettes   Smokeless tobacco: Never  Vaping Use   Vaping status: Never Used  Substance and Sexual Activity   Alcohol use: No   Drug use: No   Sexual activity: Not Currently  Other Topics Concern   Not on file  Social History Narrative   Not on file   Social Determinants of Health   Financial Resource Strain: Low Risk  (10/29/2019)   Overall Financial Resource Strain (CARDIA)    Difficulty  of Paying Living Expenses: Not very hard  Recent Concern: Financial Resource Strain - Medium Risk (09/08/2019)   Overall Financial Resource Strain (CARDIA)    Difficulty of Paying Living Expenses: Somewhat hard  Food Insecurity: No Food Insecurity (09/08/2022)   Hunger Vital Sign    Worried About Running Out of Food in the Last Year: Never true    Ran Out of Food in the Last Year: Never true  Transportation Needs: No Transportation Needs (09/08/2022)   PRAPARE -  Administrator, Civil Service (Medical): No    Lack of Transportation (Non-Medical): No  Physical Activity: Inactive (10/29/2019)   Exercise Vital Sign    Days of Exercise per Week: 0 days    Minutes of Exercise per Session: 0 min  Stress: No Stress Concern Present (10/29/2019)   [employer] of Occupational Health - Occupational Stress Questionnaire    Feeling of Stress : Only a little  Recent Concern: Stress - Stress Concern Present (09/08/2019)   [employer] of Occupational Health - Occupational Stress Questionnaire    Feeling of Stress : To some extent  Social Connections: Moderately Integrated (10/29/2019)   Social Connection and Isolation Panel [NHANES]    Frequency of Communication with Friends and Family: More than three times a week    Frequency of Social Gatherings with Friends and Family: More than three times a week    Attends Religious Services: More than 4 times per year    Active Member of [employer] or Organizations: No    Attends Engineer, structural: Never    Marital Status: Married   Family History  Problem Relation Age of Onset   Colon cancer Mother    Diabetes Other    High blood pressure Other    Prostate cancer Brother    Diabetes Brother    No Known Allergies Prior to Admission medications   Medication Sig Start Date End Date Taking? Authorizing Provider  atorvastatin (LIPITOR) 10 MG tablet TAKE 1 TABLET(10 MG) BY MOUTH DAILY 08/02/22  Yes Abernathy, Alyssa, NP  dapagliflozin propanediol (FARXIGA) 10 MG TABS tablet Take 1 tablet (10 mg total) by mouth daily. 08/02/22  Yes Abernathy, Arlyss Repress, NP  edoxaban (SAVAYSA) 30 MG TABS tablet Take 30 mg by mouth daily. 11/01/21  Yes [provider]  enzalutamide (XTANDI) 40 MG tablet Take 4 tablets (160 mg total) by mouth daily. 07/17/22  Yes [name], MD  furosemide (LASIX) 20 MG tablet Take 1 tablet (20 mg total) by mouth daily. 07/03/22 10/01/22 Yes Agbor-Etang, Arlys John, MD  glipiZIDE  (GLUCOTROL XL) 2.5 MG 24 hr tablet Take 1 tablet (2.5 mg total) by mouth daily with breakfast. 08/29/22  Yes Abernathy, Alyssa, NP  metoprolol tartrate 37.5 MG TABS Take 37.5 mg by mouth 2 (two) times daily. 10/06/21  Yes Wieting, Richard, MD  pantoprazole (PROTONIX) 40 MG tablet Take 1 tablet (40 mg total) by mouth daily. 06/27/22 06/27/23 Yes Minna Antis, MD  polyethylene glycol (MIRALAX / GLYCOLAX) 17 g packet Take 17 g by mouth 2 (two) times daily. Skip the dose if no constipation 07/24/22 10/22/22 Yes Gillis Santa, MD  SPIRIVA RESPIMAT 2.5 MCG/ACT AERS INHALE 2 PUFFS INTO THE LUNGS DAILY 06/19/22  Yes Abernathy, Alyssa, NP  albuterol (VENTOLIN HFA) 108 (90 Base) MCG/ACT inhaler Inhale 2 puffs into the lungs every 4 (four) hours as needed for wheezing or shortness of breath. 03/16/21   [name], NP  colchicine 0.6 MG tablet TAKE 1 TABLET BY MOUTH TWICE DAILY  FOR ANKLE PAIN Patient not taking: Reported on 09/03/2022 05/20/22   Lyndon Code, MD  doxycycline (VIBRAMYCIN) 100 MG capsule Take 100 mg by mouth 2 (two) times daily. Patient not taking: Reported on 09/08/2022 09/06/22 09/16/22  [provider]  ferrous sulfate 325 (65 FE) MG tablet Take 1 tablet (325 mg total) by mouth daily. Patient not taking: Reported on 09/03/2022 03/07/22   Lyndon Code, MD  glucose blood [facility] VERIO) test strip USE ONCE DAILY 02/04/22   [name], NP  sucralfate (CARAFATE) 1 g tablet Take 1 tablet (1 g total) by mouth 4 (four) times daily. Patient not taking: Reported on 09/03/2022 06/27/22 06/27/23  Minna Antis, MD  vitamin B-12 (CYANOCOBALAMIN) 1000 MCG tablet Take 1 tablet (1,000 mcg total) by mouth daily. 12/08/20   [name], NP   Recent Labs    09/09/22 0436 09/10/22 0322 09/11/22 0408  WBC 7.5 10.6* 9.2  HGB 13.0 13.9 13.3  HCT 40.3 41.9 39.5  PLT 148* 182 186  K 3.5 3.6 3.6  CL 107 108 107  CO2 21* 21* 22  BUN 37* 32* 31*  CREATININE 1.85* 1.74* 1.57*  GLUCOSE 136*  143* 138*  CALCIUM 8.7* 8.9 8.5*  INR 1.3*  --   --    No results found.  Component     Latest Ref Rng 09/08/2022  Sed Rate     0 - 20 mm/hr 57 (H)   CRP     <1.0 mg/dL 16.1 (H)     Positive ROS: All other systems have been reviewed and were otherwise negative with the exception of those mentioned in the HPI and as above.  Physical Exam: BP 112/70 (BP Location: Left Arm)   Pulse 66   Temp 98.9 F (37.2 C)   Resp (!) 24   Ht 6' (1.829 m)   Wt 105 kg   SpO2 96%   BMI 31.40 kg/m  General:  Alert, no acute distress Psychiatric:  Patient is competent for consent with normal mood and affect   Cardiovascular:  No pedal edema, regular rate and rhythm Respiratory:  No wheezing, non-labored breathing GI:  Abdomen is soft and non-tender Skin:  No lesions in the area of chief complaint, no erythema Neurologic:  Sensation intact distally, CN grossly intact Lymphatic:  No axillary or cervical lymphadenopathy  Orthopedic Exam:  LUE: +ain/pin/u motor SILT r/u/m/ax +rad pulse RoM wrist is relatively normal.  Range of motion of the elbow is limited.  Painful arc is from approximately 30-90 degrees.  There is increased pain with full extension and full flexion.  There is significant erythema about the proximal forearm, more so on the lateral aspect with extension proximal to the elbow.  There is exquisite tenderness in this region of erythema.  There is also mild soft tissue swelling of the entire forearm and fingers.  No palpable crepitus, no palpable abscesses. Compartments soft, compressible.    Imaging:  As above: No focal abscess on CT scan from 09/08/22. Findings consistent with cellulitis visualized.  Assessment/Plan: 89 yo M w/likely forearm/elbow cellulitis that has not yet completely responded to IV Abx. No current concern for compartment syndrome or necrotizing fasciitis.  Recommended further imaging in the form of an MRI. However, patient unable to get MRI at this facility.  Therefore, recommended repeat CT scan to rule out elbow effusion and new abscess formation Continue IV Abx Will plan to follow up on results of CT scan.    Signa Kell  09/11/2022 1:11 PM

## 2022-09-11 NOTE — TOC Progression Note (Signed)
Transition of Care [facility]) - Progression Note    Patient Details  Name: [name] MRN: [id number] Date of Birth: 1933/06/21  [facility] CM/SW Contact  Marlowe Sax, RN Phone Number: 09/11/2022, 1:22 PM  Clinical Narrative:      Met with the patient to discuss DC plan and needs He lives at home with his wife, he uses a cane and RW at home and does not need additional DME< he reports that he drives and can get a ride if needed He can afford his medications and has no trouble getting to the Dr when needed He reports that he has no needs      Expected Discharge Plan and Services                                               Social Determinants of Health (SDOH) Interventions SDOH Screenings   Food Insecurity: No Food Insecurity (09/08/2022)  Housing: Low Risk  (09/08/2022)  Transportation Needs: No Transportation Needs (09/08/2022)  Utilities: Not At Risk (09/08/2022)  Alcohol Screen: Low Risk  (10/26/2021)  Depression (PHQ2-9): Low Risk  (03/05/2022)  Financial Resource Strain: Low Risk  (10/29/2019)  Recent Concern: Financial Resource Strain - Medium Risk (09/08/2019)  Physical Activity: Inactive (10/29/2019)  Social Connections: Moderately Integrated (10/29/2019)  Stress: No Stress Concern Present (10/29/2019)  Recent Concern: Stress - Stress Concern Present (09/08/2019)  Tobacco Use: Medium Risk (09/08/2022)    Readmission Risk Interventions    12/26/2020   12:08 PM  Readmission Risk Prevention Plan  Transportation Screening Complete  PCP or Specialist Appt within 3-5 Days Complete  HRI or Home Care Consult Complete  Social Work Consult for Recovery Care Planning/Counseling Complete  Palliative Care Screening Not Applicable

## 2022-09-11 NOTE — Progress Notes (Signed)
  Progress Note   Patient: [name] ZOX:[id number] DOB: May 08, 1933 DOA: 09/08/2022     3 DOS: the patient was seen and examined on 09/11/2022   Brief hospital course: TAN CHRETIEN is a 89 y.o. male with medical history significant of DVT, Atrial fibrillation , CHF, CKDIIIb-IV , DMII, Hypertension, OSA, prosate ca, CVA , DMII , Hyperlipidemia,who presents to ED with interim history of diagnosis of cellulitis of right arm 2 days ago .  Patient is treated with vancomycin and Rocephin.  CT scan of the right arm did not show any evidence of active abscess.   Active Problems:   Acute renal failure superimposed on stage 3a chronic kidney disease (HCC)   Uncontrolled type 2 diabetes mellitus with hyperglycemia (HCC)   Type II diabetes mellitus with renal manifestations (HCC)   Metabolic acidosis   Thrombocytopenia (HCC)   Cellulitis of right arm   Assessment and Plan: Cellulitis of right forearm. Patient is treated with vancomycin and Rocephin.  Initially the swelling was better, but seems to be much worse today.  Renal function is better, leukocytosis has resolved.  Patient does not have any confusion.  Will obtain orthopedic consult just in case, but lower suspicion for necrotizing fasciitis. Patient is on anticoagulation, will check duplex ultrasound to make sure do not have developed DVT. Will change antibiotic to Zosyn in addition to vancomycin for now.  Continue to monitor closely.   Acute renal failure superimposed on stage III chronic kidney disease. Mild metabolic acidosis. Reviewed prior chart, patient had a chronic kidney disease stage IIIa.  Renal functions better after fluids. .   Paroxysmal atrial fibrillation. History of CVA. Continue anticoagulation and metoprolol.   Chronic diastolic congestive heart failure. Stable, no exacerbation.   Obesity of the BMI 31.41. Diet and exercise.   Obstruct sleep apnea. On nocturnal oxygen.   Uncontrolled type 2 diabetes  with hyperglycemia  Glucose better controlled today.        Subjective:  Patient has worsening right arm pain and swelling.  No fever or chills.  No nausea vomiting.  Physical Exam: Vitals:   09/10/22 2131 09/10/22 2341 09/11/22 0822 09/11/22 1224  BP: 125/74 117/77 117/70 112/70  Pulse:  62 63 66  Resp:  16 16 (!) 24  Temp: 98.7 F (37.1 C)  98.9 F (37.2 C)   TempSrc: Oral     SpO2: 96% 97% 97% 96%  Weight:      Height:       General exam: Appears calm and comfortable  Respiratory system: Clear to auscultation. Respiratory effort normal. Cardiovascular system: S1 & S2 heard, RRR. No JVD, murmurs, rubs, gallops or clicks. No pedal edema. Gastrointestinal system: Abdomen is nondistended, soft and nontender. No organomegaly or masses felt. Normal bowel sounds heard. Central nervous system: Alert and oriented. No focal neurological deficits. Extremities: Right arm swelling seems to be worse, with increased tenderness. Skin: No rashes, lesions or ulcers Psychiatry: Judgement and insight appear normal. Mood & affect appropriate.    Data Reviewed:  Lab results reviewed.  Family Communication: Wife updated at the bedside.  Disposition: Status is: Inpatient Remains inpatient appropriate because: Severity of disease, IV treatment.     Time spent: 50 minutes  Author: Marrion Coy, MD 09/11/2022 12:56 PM  For on call review [email or web address].

## 2022-09-11 NOTE — Plan of Care (Signed)

## 2022-09-11 NOTE — Progress Notes (Addendum)
Pharmacy Antibiotic Note  [name] is a 89 y.o. male admitted on 09/08/2022 with cellulitis. PMH significant for T2DM, CKD3b, thrombocytopenia, HTN, AF, CHF, OA. He was diagnosed with cellulitis 8/16 and prescribed doxycycline. Per patient, despite being compliant with therapy, he has noted increase pain and continued swelling and redness. Pharmacy has been consulted for vancomycin dosing.  Plan: Day 4 of antibiotics Increase vancomycin to 1250 mg IV Q24H for improved renal function. Goal AUC 400-550. Expected AUC: 477.9 Expected Css min: 13.5 SCr used: 1.57 Weight used: IBW, Vd used: 0.72 (BMI 31.3) Patient being transitioned to Zosyn 3.375 mg IV q8h (extended infusion) Continue to monitor renal function and follow culture results   Temp (24hrs), Avg:98.7 F (37.1 C), Min:98.5 F (36.9 C), Max:98.9 F (37.2 C)   Recent Labs  Lab 09/08/22 1013 09/08/22 1016 09/09/22 0436 09/10/22 0322 09/11/22 0408  WBC 8.5  --  7.5 10.6* 9.2  CREATININE 2.08*  --  1.85* 1.74* 1.57*  LATICACIDVEN  --  1.9  --   --   --     Estimated Creatinine Clearance: 41.5 mL/min (A) (by C-G formula based on SCr of 1.57 mg/dL (H)).    No Known Allergies  Antimicrobials this admission: 8/18 Ceftriaxone >>  8/18 Vancomycin >>   Microbiology results: 8/18 BCx: NGTD 8/18 Sputum: ordered   Thank you for allowing pharmacy to be a part of this patient's care.  Merryl Hacker, PharmD Clinical Pharmacist 09/11/2022 9:41 AM

## 2022-09-11 NOTE — Care Management Important Message (Signed)
Important Message  Patient Details  Name: [name] MRN: [id number] Date of Birth: 1933/05/10   Medicare Important Message Given:  N/A - LOS <3 / Initial given by admissions     Olegario Messier A [name] 09/11/2022, 10:33 AM

## 2022-09-12 DIAGNOSIS — E1165 Type 2 diabetes mellitus with hyperglycemia: Secondary | ICD-10-CM | POA: Diagnosis not present

## 2022-09-12 DIAGNOSIS — I480 Paroxysmal atrial fibrillation: Secondary | ICD-10-CM

## 2022-09-12 DIAGNOSIS — L03113 Cellulitis of right upper limb: Secondary | ICD-10-CM | POA: Diagnosis not present

## 2022-09-12 LAB — COMPREHENSIVE METABOLIC PANEL
ALT: 12 U/L (ref 0–44)
AST: 15 U/L (ref 15–41)
Albumin: 2.5 g/dL — ABNORMAL LOW (ref 3.5–5.0)
Alkaline Phosphatase: 91 U/L (ref 38–126)
Anion gap: 9 (ref 5–15)
BUN: 28 mg/dL — ABNORMAL HIGH (ref 8–23)
CO2: 22 mmol/L (ref 22–32)
Calcium: 8.6 mg/dL — ABNORMAL LOW (ref 8.9–10.3)
Chloride: 107 mmol/L (ref 98–111)
Creatinine, Ser: 1.5 mg/dL — ABNORMAL HIGH (ref 0.61–1.24)
GFR, Estimated: 45 mL/min — ABNORMAL LOW (ref >60)
Glucose, Bld: 142 mg/dL — ABNORMAL HIGH (ref 70–99)
Potassium: 3.7 mmol/L (ref 3.5–5.1)
Sodium: 138 mmol/L (ref 135–145)
Total Bilirubin: 1.2 mg/dL (ref 0.3–1.2)
Total Protein: 6.7 g/dL (ref 6.5–8.1)

## 2022-09-12 LAB — CBC
HCT: 42.8 % (ref 39.0–52.0)
Hemoglobin: 14 g/dL (ref 13.0–17.0)
MCH: 31.1 pg (ref 26.0–34.0)
MCHC: 32.7 g/dL (ref 30.0–36.0)
MCV: 95.1 fL (ref 80.0–100.0)
Platelets: 216 10*3/uL (ref 150–400)
RBC: 4.5 MIL/uL (ref 4.22–5.81)
RDW: 14 % (ref 11.5–15.5)
WBC: 7.6 10*3/uL (ref 4.0–10.5)
nRBC: 0 % (ref 0.0–0.2)

## 2022-09-12 LAB — GLUCOSE, CAPILLARY
Glucose-Capillary: 134 mg/dL — ABNORMAL HIGH (ref 70–99)
Glucose-Capillary: 162 mg/dL — ABNORMAL HIGH (ref 70–99)
Glucose-Capillary: 145 mg/dL — ABNORMAL HIGH (ref 70–99)
Glucose-Capillary: 111 mg/dL — ABNORMAL HIGH (ref 70–99)

## 2022-09-12 MED ORDER — LINEZOLID 600 MG/300ML IV SOLN
600.0000 mg | Freq: Two times a day (BID) | INTRAVENOUS | Status: DC
  Administered 2022-09-12 – 2022-09-16 (×8): 600 mg via INTRAVENOUS
  Filled 2022-09-12 (×8): qty 300

## 2022-09-12 NOTE — Progress Notes (Signed)
Patient states that his elbow and forearm are feeling somewhat better today he feels that his swelling has slightly decreased.  It is not as painful as it was yesterday. Patient was unable to obtain an MRI due to pacemaker placement so a CT scan of R elbow was obtained instead. IMPRESSION: 1. Extensive edema and ill-defined fluid throughout the soft tissues of the distal upper arm, elbow, and proximal forearm. The degree of swelling and edema appears to have progressed when compared to the previous CT. No well-defined or drainable fluid collection on non contrasted images. No soft tissue gas. 2. Possible developing cortical erosion along the posteromedial aspect of the olecranon concerning for osteomyelitis. Consider further evaluation with MRI of the right elbow with and without contrast. 3. Small elbow joint effusion, nonspecific. Septic arthritis not excluded.  On personal read of the imaging, there is no focal abscess collection.  There may be a small elbow joint effusion.  Region of possible osteomyelitis seems very unlikely, especially in this clinical scenario of a less than 1 week history of elbow pain.  On exam today, he is able to range his elbow from 15-100 actively degrees relatively pain-free.  He does have somewhat increased pain beyond this range of motion.  There is still significant erythema about the forearm and lateral aspect of the elbow with extension proximally.  There is only mild tenderness to palpation today, which is significantly improved from yesterday's exam.  There is also continued soft tissue swelling, although this appears to be decreased compared to yesterday as well.  Assessment/Plan: 89 yo M w/likely forearm/elbow cellulitis that has not yet completely responded to IV Abx.  His exam today is less concerning for septic joint.  Osteomyelitis would be very unlikely given his acute onset of symptoms. Continue IV Abx No current plan for further workup or surgical  intervention given clinical improvement.

## 2022-09-12 NOTE — Care Management Important Message (Signed)
Important Message  Patient Details  Name: [name] MRN: [id number] Date of Birth: 02-02-1933   Medicare Important Message Given:  Yes     Verita Schneiders Nas[place] Belson 09/12/2022, 10:49 AM

## 2022-09-12 NOTE — Consult Note (Addendum)
NAME: [name]  DOB: Jul 03, 1933  MRN: [id number]  Date/Time: 09/12/2022 7:13 PM  REQUESTING PROVIDER: Dr.Zhang Subjective:  REASON FOR CONSULT: rt arm cellulitis ?wife at bedside [name] is a 89 y.o. with a history of Afib, CHF< prostate ca on xtandi, HTN, DM, Previous DVT, OSA, CVA, leadless Pacemaker, CKD presented to ED on 8/18 with swelling rt forearm and upper arm of 2 days duration. He had gone to his PCP on 8/16 and she diagnosed him with cellulitis and gave doxy. As he was not getting better he came to ED on 8/18  09/08/22 10:15  BP 96/55 !  Temp 97.8 F (36.6 C)  Pulse Rate 93  Resp 18  SpO2 94 %    Latest Reference Range & Units 09/08/22 10:13  WBC 4.0 - 10.5 K/uL 8.5  Hemoglobin 13.0 - 17.0 g/dL 95.1  HCT 88.4 - 16.6 % 44.5  Platelets 150 - 400 K/uL 157  Creatinine 0.61 - 1.24 mg/dL 0.63 (H)   Pt is not aware whether he had any trauma. He leans on his elbow at work a lot. 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  I am asked to see him for the cellulitis of his rt arm  Past Medical History:  Diagnosis Date   Acute deep vein thrombosis (DVT) of femoral vein of left lower extremity (HCC)    Atrial fibrillation (HCC)    CHF (congestive heart failure) (HCC)    Chronic kidney disease    Diabetes (HCC)    Hearing loss    High blood pressure    History of bladder problems    Hyperlipidemia    Kidney stones 11/22/2014   Liver cyst    OSA (obstructive sleep apnea)    Prostate cancer (HCC)    Prostate cancer (HCC)    Stroke (HCC)    Valvular heart disease     Past Surgical History:  Procedure Laterality Date   CATARACT EXTRACTION     COLONOSCOPY WITH PROPOFOL N/A 07/02/2019   Procedure: COLONOSCOPY WITH PROPOFOL;  Surgeon: Toney Reil, MD;  Location: [facility] ENDOSCOPY;  Service: Gastroenterology;  Laterality: N/A;   ESOPHAGOGASTRODUODENOSCOPY (EGD) WITH PROPOFOL N/A 03/19/2022   Procedure: ESOPHAGOGASTRODUODENOSCOPY (EGD)  WITH PROPOFOL;  Surgeon: Wyline Mood, MD;  Location: [facility] ENDOSCOPY;  Service: Gastroenterology;  Laterality: N/A;   IR CT HEAD LTD  08/27/2019   IR PERCUTANEOUS ART THROMBECTOMY/INFUSION INTRACRANIAL INC DIAG ANGIO  08/27/2019       IR PERCUTANEOUS ART THROMBECTOMY/INFUSION INTRACRANIAL INC DIAG ANGIO  08/27/2019   LOWER EXTREMITY ANGIOGRAPHY Left 12/25/2020   Procedure: Lower Extremity Angiography;  Surgeon: Annice Needy, MD;  Location: [facility] INVASIVE CV LAB;  Service: Cardiovascular;  Laterality: Left;   PACEMAKER LEADLESS INSERTION N/A 12/19/2020   Procedure: PACEMAKER LEADLESS INSERTION;  Surgeon: Marcina Millard, MD;  Location: [facility] INVASIVE CV LAB;  Service: Cardiovascular;  Laterality: N/A;   PROSTATE CANCER     PROSTATE SURGERY     RADIOLOGY WITH ANESTHESIA N/A 08/27/2019   Procedure: IR WITH ANESTHESIA;  Surgeon: Julieanne Cotton, MD;  Location: [facility] OR;  Service: Radiology;  Laterality: N/A;    Social History   Socioeconomic History   Marital status: Married    Spouse name: Jadel Wysong   Number of children: 2   Years of education: 12   Highest education level: 12th grade  Occupational History   Occupation: Airport  Tobacco Use   Smoking status: Former    Types: Cigarettes  Smokeless tobacco: Never  Vaping Use   Vaping status: Never Used  Substance and Sexual Activity   Alcohol use: No   Drug use: No   Sexual activity: Not Currently  Other Topics Concern   Not on file  Social History Narrative   Not on file   Social Determinants of Health   Financial Resource Strain: Low Risk  (10/29/2019)   Overall Financial Resource Strain (CARDIA)    Difficulty of Paying Living Expenses: Not very hard  Recent Concern: Financial Resource Strain - Medium Risk (09/08/2019)   Overall Financial Resource Strain (CARDIA)    Difficulty of Paying Living Expenses: Somewhat hard  Food Insecurity: No Food Insecurity (09/08/2022)   Hunger Vital Sign    Worried About Running Out of Food  in the Last Year: Never true    Ran Out of Food in the Last Year: Never true  Transportation Needs: No Transportation Needs (09/08/2022)   PRAPARE - Administrator, Civil Service (Medical): No    Lack of Transportation (Non-Medical): No  Physical Activity: Inactive (10/29/2019)   Exercise Vital Sign    Days of Exercise per Week: 0 days    Minutes of Exercise per Session: 0 min  Stress: No Stress Concern Present (10/29/2019)   [employer] of Occupational Health - Occupational Stress Questionnaire    Feeling of Stress : Only a little  Recent Concern: Stress - Stress Concern Present (09/08/2019)   [employer] of Occupational Health - Occupational Stress Questionnaire    Feeling of Stress : To some extent  Social Connections: Moderately Integrated (10/29/2019)   Social Connection and Isolation Panel [NHANES]    Frequency of Communication with Friends and Family: More than three times a week    Frequency of Social Gatherings with Friends and Family: More than three times a week    Attends Religious Services: More than 4 times per year    Active Member of [employer] or Organizations: No    Attends Banker Meetings: Never    Marital Status: Married  Catering manager Violence: Not At Risk (09/08/2022)   Humiliation, Afraid, Rape, and Kick questionnaire    Fear of Current or Ex-Partner: No    Emotionally Abused: No    Physically Abused: No    Sexually Abused: No    Family History  Problem Relation Age of Onset   Colon cancer Mother    Diabetes Other    High blood pressure Other    Prostate cancer Brother    Diabetes Brother    No Known Allergies I? Current Facility-Administered Medications  Medication Dose Route Frequency Provider Last Rate Last Admin   acetaminophen (TYLENOL) tablet 650 mg  650 mg Oral Q6H PRN Lurline Del, MD   650 mg at 09/10/22 2141   Or   acetaminophen (TYLENOL) suppository 650 mg  650 mg Rectal Q6H PRN Lurline Del, MD        albuterol (PROVENTIL) (2.5 MG/3ML) 0.083% nebulizer solution 2.5 mg  2.5 mg Nebulization Q2H PRN Lurline Del, MD       edoxaban (SAVAYSA) tablet 30 mg  30 mg Oral Q24H Marrion Coy, MD   30 mg at 09/11/22 2142   HYDROmorphone (DILAUDID) injection 0.5 mg  0.5 mg Intravenous Q4H PRN Marrion Coy, MD   0.5 mg at 09/12/22 0125   insulin aspart (novoLOG) injection 0-9 Units  0-9 Units Subcutaneous TID WC Lurline Del, MD   2 Units at 09/12/22 1746  ondansetron (ZOFRAN) tablet 4 mg  4 mg Oral Q6H PRN Lurline Del, MD       Or   ondansetron [facility]) injection 4 mg  4 mg Intravenous Q6H PRN Lurline Del, MD       Oral care mouth rinse  15 mL Mouth Rinse PRN Lurline Del, MD       oxyCODONE-acetaminophen (PERCOCET/ROXICET) 5-325 MG per tablet 1 tablet  1 tablet Oral Q4H PRN Marrion Coy, MD   1 tablet at 09/11/22 1308   piperacillin-tazobactam (ZOSYN) IVPB 3.375 g  3.375 g Intravenous Q8H Merryl Hacker, [place]   Stopped at 09/12/22 1653   sodium bicarbonate tablet 650 mg  650 mg Oral BID Marrion Coy, MD   650 mg at 09/12/22 0930   vancomycin (VANCOREADY) IVPB 1250 mg/250 mL  1,250 mg Intravenous Q24H Merryl Hacker, [place]   Stopped at 09/11/22 2320     Abtx:  Anti-infectives (From admission, onward)    Start     Dose/Rate Route Frequency Ordered Stop   09/11/22 2230  vancomycin (VANCOREADY) IVPB 1250 mg/250 mL       Placed in "Followed by" Linked Group   1,250 mg 166.7 mL/hr over 90 Minutes Intravenous Every 24 hours 09/11/22 0944     09/11/22 1400  piperacillin-tazobactam (ZOSYN) IVPB 3.375 g        3.375 g 12.5 mL/hr over 240 Minutes Intravenous Every 8 hours 09/11/22 1324     09/09/22 2230  vancomycin (VANCOCIN) IVPB 1000 mg/200 mL premix  Status:  Discontinued       Placed in "Followed by" Linked Group   1,000 mg 200 mL/hr over 60 Minutes Intravenous Every 24 hours 09/08/22 2136 09/11/22 0944   09/08/22 2230  vancomycin (VANCOREADY) IVPB 2000 mg/400  mL       Placed in "Followed by" Linked Group   2,000 mg 200 mL/hr over 120 Minutes Intravenous  Once 09/08/22 2136 09/09/22 0051   09/08/22 2200  cefTRIAXone (ROCEPHIN) 1 g in sodium chloride 0.9 % 100 mL IVPB  Status:  Discontinued        1 g 200 mL/hr over 30 Minutes Intravenous Every 24 hours 09/08/22 2104 09/11/22 1253   09/08/22 1315  cefTRIAXone (ROCEPHIN) 1 g in sodium chloride 0.9 % 100 mL IVPB        1 g 200 mL/hr over 30 Minutes Intravenous  Once 09/08/22 1308 09/08/22 1350       REVIEW OF SYSTEMS:  Const:  fever, negative chills, negative weight loss Eyes: negative diplopia or visual changes, negative eye pain ENT: negative coryza, negative sore throat Resp: negative cough, hemoptysis, dyspnea Cards: negative for chest pain, palpitations, lower extremity edema GU: negative for frequency, dysuria and hematuria GI: Negative for abdominal pain, diarrhea, bleeding, constipation Skin: rt arm painful swelling Heme: negative for easy bruising and gum/nose bleeding MS: weakness Neurolo:negative for headaches, dizziness, vertigo, memory problems  Psych: negative for feelings of anxiety, depression  Endocrine: negative for thyroid, diabetes Allergy/Immunology- negative for any medication or food allergies  Objective:  VITALS:  BP (!) 108/94   Pulse 88   Temp 98.4 F (36.9 C) (Oral)   Resp 15   Ht 6' (1.829 m)   Wt 102.6 kg   SpO2 94%   BMI 30.68 kg/m   PHYSICAL EXAM:  General: Alert, cooperative, no distress, appears stated age.  Head: Normocephalic, without obvious abnormality, atraumatic. Eyes: Conjunctivae clear, anicteric sclerae. Pupils are equal ENT Nares normal. No drainage  or sinus tenderness. Lips, mucosa, and tongue normal. No Thrush.Multiple missing teeth Neck:  symmetrical, no adenopathy, thyroid: non tender no carotid bruit and no JVD. Back: No CVA tenderness. Lungs: Clear to auscultation bilaterally. No Wheezing or Rhonchi. No rales. Heart:  irregular Abdomen: Soft, non-tender,not distended. Bowel sounds normal. No masses Extremities: rt foream swollen, erythematous, rt elbow area lichenified skin      Swelling extending upper arm Skin: No rashes or lesions. Or bruising Lymph: Cervical, supraclavicular normal. Neurologic: Grossly non-focal Pertinent Labs Lab Results CBC    Component Value Date/Time   WBC 7.6 09/12/2022 0756   RBC 4.50 09/12/2022 0756   HGB 14.0 09/12/2022 0756   HGB 15.5 07/12/2022 1157   HGB 14.6 06/11/2019 0953   HCT 42.8 09/12/2022 0756   HCT 46.0 06/11/2019 0953   PLT 216 09/12/2022 0756   PLT 183 07/12/2022 1157   PLT 154 06/11/2019 0953   MCV 95.1 09/12/2022 0756   MCV 94 06/11/2019 0953   MCV 92 10/13/2013 0415   MCH 31.1 09/12/2022 0756   MCHC 32.7 09/12/2022 0756   RDW 14.0 09/12/2022 0756   RDW 13.3 06/11/2019 0953   RDW 15.3 (H) 10/13/2013 0415   LYMPHSABS 0.8 09/08/2022 1013   LYMPHSABS 2.4 10/13/2013 0415   MONOABS 1.1 (H) 09/08/2022 1013   MONOABS 0.8 10/13/2013 0415   EOSABS 0.1 09/08/2022 1013   EOSABS 0.2 10/13/2013 0415   BASOSABS 0.0 09/08/2022 1013   BASOSABS 0.0 10/13/2013 0415       Latest Ref Rng & Units 09/12/2022    7:56 AM 09/11/2022    4:08 AM 09/10/2022    3:22 AM  CMP  Glucose 70 - 99 mg/dL 604  540  981   BUN 8 - 23 mg/dL 28  31  32   Creatinine 0.61 - 1.24 mg/dL 1.91  4.78  2.95   Sodium 135 - 145 mmol/L 138  136  138   Potassium 3.5 - 5.1 mmol/L 3.7  3.6  3.6   Chloride 98 - 111 mmol/L 107  107  108   CO2 22 - 32 mmol/L 22  22  21    Calcium 8.9 - 10.3 mg/dL 8.6  8.5  8.9   Total Protein 6.5 - 8.1 g/dL 6.7  7.0  7.3   Total Bilirubin 0.3 - 1.2 mg/dL 1.2  1.2  1.2   Alkaline Phos 38 - 126 U/L 91  93  104   AST 15 - 41 U/L 15  15  15    ALT 0 - 44 U/L 12  11  13        Microbiology: Recent Results (from the past 240 hour(s))  Culture, blood (Routine x 2)     Status: None (Preliminary result)   Collection Time: 09/08/22 10:16 AM   Specimen:  BLOOD  Result Value Ref Range Status   Specimen Description BLOOD LEFT ARM  Final   Special Requests   Final    BOTTLES DRAWN AEROBIC AND ANAEROBIC Blood Culture adequate volume   Culture   Final    NO GROWTH 4 DAYS Performed at [facility], [address] [place], [place] [address]    Report Status PENDING  Incomplete  Culture, blood (Routine x 2)     Status: None (Preliminary result)   Collection Time: 09/08/22  1:23 PM   Specimen: BLOOD  Result Value Ref Range Status   Specimen Description BLOOD RIGHT ANTECUBITAL  Final   Special Requests   Final  BOTTLES DRAWN AEROBIC AND ANAEROBIC Blood Culture adequate volume   Culture   Final    NO GROWTH 4 DAYS Performed at [facility], [address] [place], [place] [address]    Report Status PENDING  Incomplete    IMAGING RESULTS: [place] arm- no DVT I have personally reviewed the films ? Impression/Recommendation ?Rt arm cellulitis- likely started at the elbow skin which is lichenified due to leaning on  the elbow constantly Need to r/o olecranon  bursitis Likely organism staph aureus or strep- unlikely to be gram neg Will change vancomycin to linezolid for better soft tissue penetration and possible antitoxin effect May stop zosyn soon  CKD  Ca prostate on Xtendi  Has leadless pacemaker  Afib  Dm- management as per primary team   ___________________________________________________ Discussed with patient, and wife in detail  Note:  This document was prepared using Dragon voice recognition software and may include unintentional dictation errors.

## 2022-09-12 NOTE — Plan of Care (Addendum)
Patient complaining that his chest is heavy and having trouble catching his breath (especially if trying to talk.) Family also noticed when talking to him on the phone and are concerned. SPo2 remains well above 90 and on RA  Dr. Informed.  Chest xray ordered.

## 2022-09-12 NOTE — Progress Notes (Signed)
  Progress Note   Patient: [name]:[id number] DOB: 06/09/1933 DOA: 09/08/2022     4 DOS: the patient was seen and examined on 09/12/2022   Brief hospital course: ODELL CARRERO is a 89 y.o. male with medical history significant of DVT, Atrial fibrillation , CHF, CKDIIIb-IV , DMII, Hypertension, OSA, prosate ca, CVA , DMII , Hyperlipidemia,who presents to ED with interim history of diagnosis of cellulitis of right arm 2 days ago .  Patient is treated with vancomycin and Rocephin.  CT scan of the right arm did not show any evidence of active abscess.   Active Problems:   Acute renal failure superimposed on stage 3a chronic kidney disease (HCC)   Paroxysmal atrial fibrillation (HCC)   Uncontrolled type 2 diabetes mellitus with hyperglycemia (HCC)   Type II diabetes mellitus with renal manifestations (HCC)   Metabolic acidosis   Thrombocytopenia (HCC)   Cellulitis of right arm   Assessment and Plan: Cellulitis of right forearm. Patient was treated with vancomycin and Rocephin.  Initially the swelling was better, but got worse on 8/21.Marland Kitchen  Renal function is better, leukocytosis has resolved.  Patient does not have any confusion. Had orthopedic consult, but lower suspicion for necrotizing fasciitis. Patient is on anticoagulation,  duplex ultrasound showed no DVT. Antibiotic to Zosyn in addition to vancomycin.  Could not get MRI due to pacemaker. Arm swelling seems to be better today, pain is also better.  However, CT scan did show possibility of bony involvement.  Will obtain ID consult.   Acute renal failure superimposed on stage III chronic kidney disease. Mild metabolic acidosis. Reviewed prior chart, patient had a chronic kidney disease stage IIIa.  Renal functions better after fluids. .   Paroxysmal atrial fibrillation. History of CVA. Continue anticoagulation and metoprolol.   Chronic diastolic congestive heart failure. Stable, no exacerbation.   Obesity of the BMI  31.41. Diet and exercise.   Obstruct sleep apnea. On nocturnal oxygen.   Uncontrolled type 2 diabetes with hyperglycemia  Stable..       Subjective:  Patient arm pain seems better today.  Swelling also better.  Physical Exam: Vitals:   09/11/22 1557 09/12/22 0044 09/12/22 0500 09/12/22 0800  BP: 118/64 114/83  117/72  Pulse: 95 96  79  Resp: 16 20  16   Temp: 97.8 F (36.6 C) 97.9 F (36.6 C)  98.2 F (36.8 C)  TempSrc:      SpO2: 97% 100%  96%  Weight:   102.6 kg   Height:       General exam: Appears calm and comfortable  Respiratory system: Clear to auscultation. Respiratory effort normal. Cardiovascular system: S1 & S2 heard, RRR. No JVD, murmurs, rubs, gallops or clicks. No pedal edema. Gastrointestinal system: Abdomen is nondistended, soft and nontender. No organomegaly or masses felt. Normal bowel sounds heard. Central nervous system: Alert and oriented. No focal neurological deficits. Extremities: Right arm swelling appears to be better than yesterday. Skin: No rashes, lesions or ulcers Psychiatry: Judgement and insight appear normal. Mood & affect appropriate.    Data Reviewed:  CT scan and the lab results reviewed.  Family Communication: Daughter updated over the phone.  Disposition: Status is: Inpatient Remains inpatient appropriate because: Severity of disease, IV treatment.     Time spent: 35 minutes  Author: Marrion Coy, MD 09/12/2022 1:36 PM  For on call review [email or web address].

## 2022-09-13 ENCOUNTER — Other Ambulatory Visit (HOSPITAL_COMMUNITY): Payer: Self-pay

## 2022-09-13 ENCOUNTER — Inpatient Hospital Stay: Payer: Medicare Other

## 2022-09-13 DIAGNOSIS — L03113 Cellulitis of right upper limb: Secondary | ICD-10-CM | POA: Diagnosis not present

## 2022-09-13 DIAGNOSIS — I4729 Other ventricular tachycardia: Secondary | ICD-10-CM

## 2022-09-13 DIAGNOSIS — E1165 Type 2 diabetes mellitus with hyperglycemia: Secondary | ICD-10-CM | POA: Diagnosis not present

## 2022-09-13 LAB — TROPONIN I (HIGH SENSITIVITY)
Troponin I (High Sensitivity): 21 ng/L — ABNORMAL HIGH (ref <18)
Troponin I (High Sensitivity): 35 ng/L — ABNORMAL HIGH (ref <18)

## 2022-09-13 LAB — CULTURE, BLOOD (ROUTINE X 2)
Culture: NO GROWTH
Culture: NO GROWTH
Special Requests: ADEQUATE
Special Requests: ADEQUATE

## 2022-09-13 LAB — GLUCOSE, CAPILLARY
Glucose-Capillary: 159 mg/dL — ABNORMAL HIGH (ref 70–99)
Glucose-Capillary: 151 mg/dL — ABNORMAL HIGH (ref 70–99)
Glucose-Capillary: 136 mg/dL — ABNORMAL HIGH (ref 70–99)
Glucose-Capillary: 136 mg/dL — ABNORMAL HIGH (ref 70–99)

## 2022-09-13 MED ORDER — METOPROLOL TARTRATE 25 MG PO TABS: 37.5000 mg | ORAL_TABLET | Freq: Two times a day (BID) | ORAL | Status: DC

## 2022-09-13 MED ORDER — METOPROLOL TARTRATE 25 MG PO TABS
37.5000 mg | ORAL_TABLET | Freq: Two times a day (BID) | ORAL | Status: DC
  Administered 2022-09-13 – 2022-09-16 (×7): 37.5 mg via ORAL
  Filled 2022-09-13 (×7): qty 2

## 2022-09-13 MED ORDER — TECHNETIUM TO 99M ALBUMIN AGGREGATED
4.2900 | Freq: Once | INTRAVENOUS | Status: AC | PRN
  Administered 2022-09-13: 4.29 via INTRAVENOUS

## 2022-09-13 NOTE — Progress Notes (Signed)
       CROSS COVER NOTE  NAME: [name] MRN: [id number] DOB : [birth year]    Concern as stated by nurse / staff     Re [address]. here for ARF and cellulitis rt arm, he has hx of Afib and has a pacemaker in. he Hrs were 140s-160s ith activity now settling in 100s to 120s at rest, voices no complaints currently. Per tele he had 13 beat run of vtach.    Pertinent findings on chart review:   Assessment and  Interventions   Assessment:  Plan: Metoprolol ordered off home meds Continue to monitor on tele      Donnie Mesa NP [facility] Cross Cover 7pm-7am - check amion for availability Pager [phone]

## 2022-09-13 NOTE — Progress Notes (Signed)
Progress Note   Patient: [name] JSE:[id number] DOB: [birth year] DOA: 09/08/2022     5 DOS: the patient was seen and examined on 09/13/2022   Brief hospital course: SOPHAT DUBOW is a 89 y.o. male with medical history significant of DVT, Atrial fibrillation , CHF, CKDIIIb-IV , DMII, Hypertension, OSA, prosate ca, CVA , DMII , Hyperlipidemia,who presents to ED with interim history of diagnosis of cellulitis of right arm 2 days ago .  Patient is treated with vancomycin and Rocephin.  CT scan of the right arm did not show any evidence of active abscess.   Active Problems:   Acute renal failure superimposed on stage 3a chronic kidney disease (HCC)   Paroxysmal atrial fibrillation (HCC)   NSVT (nonsustained ventricular tachycardia) (HCC)   Uncontrolled type 2 diabetes mellitus with hyperglycemia (HCC)   Type II diabetes mellitus with renal manifestations (HCC)   Metabolic acidosis   Thrombocytopenia (HCC)   Cellulitis of right arm   Assessment and Plan: Nonsustained ventricular tachycardia. Patient had a 12 beat of nonsustained ventricular tachycardia with a rate of 150-in the morning of 8/23. Based on history, he had a prior episodes. He also had some mild chest pain on the lateral right upper chest/axillary, I believe this is a referred pain from the arm.  Troponin has a minimal elevation.  Not consistent with angina.  However, will obtain pulmonary perfusion test to rule out PE.  Cellulitis of right forearm. Patient was treated with vancomycin and Rocephin.  Initially the swelling was better, but got worse on 8/21.Marland Kitchen  Renal function is better, leukocytosis has resolved.  Patient does not have any confusion. Had orthopedic consult, but lower suspicion for necrotizing fasciitis. Patient is on anticoagulation,  duplex ultrasound showed no DVT. Antibiotic to Zosyn in addition to vancomycin 8/21.  Could not get MRI due to pacemaker. Blood culture negative. Patient is seen by ID and  podiatry, ID still concerned about olecranon bursitis.  Antibiotics has been switched to linezolid and Zosyn 8/22. Arm swelling seems to be improving significantly today.   Acute renal failure superimposed on stage III chronic kidney disease. Mild metabolic acidosis. Reviewed prior chart, patient had a chronic kidney disease stage IIIa.  Renal functions better after fluids. .   Paroxysmal atrial fibrillation. History of CVA. Continue anticoagulation and metoprolol.   Chronic diastolic congestive heart failure. Stable, no exacerbation.   Obesity of the BMI 31.41. Diet and exercise.   Obstruct sleep apnea. On nocturnal oxygen.   Uncontrolled type 2 diabetes with hyperglycemia  Stable..      Subjective:  Patient feel much better today, arm pain is better.  Not short of breath or chest pain.  Physical Exam: Vitals:   09/12/22 1453 09/12/22 2357 09/13/22 0536 09/13/22 1010  BP: (!) 108/94 102/71 120/64 97/67  Pulse: 88 69 94 73  Resp: 15 18  17   Temp: 98.4 F (36.9 C) 98.4 F (36.9 C)  98.2 F (36.8 C)  TempSrc: Oral Oral  Oral  SpO2: 94% 92% 92% 93%  Weight:      Height:       General exam: Appears calm and comfortable  Respiratory system: Clear to auscultation. Respiratory effort normal. Cardiovascular system: S1 & S2 heard, RRR. No JVD, murmurs, rubs, gallops or clicks. No pedal edema. Gastrointestinal system: Abdomen is nondistended, soft and nontender. No organomegaly or masses felt. Normal bowel sounds heard. Central nervous system: Alert and oriented. No focal neurological deficits. Extremities: Right arm swelling improving. Skin: No  rashes, lesions or ulcers Psychiatry: Judgement and insight appear normal. Mood & affect appropriate.    Data Reviewed:  Lab results reviewed.  Family Communication: Wife updated at bedside.  Disposition: Status is: Inpatient Remains inpatient appropriate because: Severity of disease, IV treatment.     Time spent: 50  minutes  Author: Marrion Coy, MD 09/13/2022 12:30 PM  For on call review [email or web address].

## 2022-09-13 NOTE — TOC Benefit Eligibility Note (Signed)
Pharmacy Patient Advocate Encounter  Insurance verification completed.    The patient is insured through  [employer] . Patient has Medicare and is not eligible for a copay card, but may be able to apply for patient assistance, if available.    Ran test claim for Linezolid and the current 30 day co-pay is $0.00.   This test claim was processed through [facility]- copay amounts may vary at other pharmacies due to pharmacy/plan contracts, or as the patient moves through the different stages of their insurance plan.

## 2022-09-13 NOTE — Progress Notes (Signed)
Date of Admission:  09/08/2022     ID: [name] is a 89 y.o. male  Active Problems:   Uncontrolled type 2 diabetes mellitus with hyperglycemia (HCC)   Type II diabetes mellitus with renal manifestations (HCC)   Acute renal failure superimposed on stage 3a chronic kidney disease (HCC)   NSVT (nonsustained ventricular tachycardia) (HCC)   Paroxysmal atrial fibrillation (HCC)   Metabolic acidosis   Thrombocytopenia (HCC)   Cellulitis of right arm    Subjective: Pt says he is feeling a little better Pain and swelling better  Medications:   edoxaban  30 mg Oral Q24H   insulin aspart  0-9 Units Subcutaneous TID WC   metoprolol tartrate  37.5 mg Oral BID   sodium bicarbonate  650 mg Oral BID    Objective: Vital signs in last 24 hours: Patient Vitals for the past 24 hrs:  BP Temp Temp src Pulse Resp SpO2  09/13/22 1710 108/76 97.9 F (36.6 C) Oral 71 16 100 %  09/13/22 1010 97/67 98.2 F (36.8 C) Oral 73 17 93 %  09/13/22 0536 120/64 -- -- 94 -- 92 %  09/12/22 2357 102/71 98.4 F (36.9 C) Oral 69 18 92 %     PHYSICAL EXAM:  General: Alert, cooperative, no distress, appears stated age.  Lungs: Clear to auscultation bilaterally. No Wheezing or Rhonchi. No rales. Heart: Regular rate and rhythm, no murmur, rub or gallop. Abdomen: Soft, non-tender,not distended. Bowel sounds normal. No masses Extremities: rt arm swollen, erythema better Skin: No rashes or lesions. Or bruising Lymph: Cervical, supraclavicular normal. Neurologic: Grossly non-focal  Lab Results    Latest Ref Rng & Units 09/12/2022    7:56 AM 09/11/2022    4:08 AM 09/10/2022    3:22 AM  CBC  WBC 4.0 - 10.5 K/uL 7.6  9.2  10.6   Hemoglobin 13.0 - 17.0 g/dL 16.1  09.6  04.5   Hematocrit 39.0 - 52.0 % 42.8  39.5  41.9   Platelets 150 - 400 K/uL 216  186  182        Latest Ref Rng & Units 09/12/2022    7:56 AM 09/11/2022    4:08 AM 09/10/2022    3:22 AM  CMP  Glucose 70 - 99 mg/dL 409  811  914    BUN 8 - 23 mg/dL 28  31  32   Creatinine 0.61 - 1.24 mg/dL 7.82  9.56  2.13   Sodium 135 - 145 mmol/L 138  136  138   Potassium 3.5 - 5.1 mmol/L 3.7  3.6  3.6   Chloride 98 - 111 mmol/L 107  107  108   CO2 22 - 32 mmol/L 22  22  21    Calcium 8.9 - 10.3 mg/dL 8.6  8.5  8.9   Total Protein 6.5 - 8.1 g/dL 6.7  7.0  7.3   Total Bilirubin 0.3 - 1.2 mg/dL 1.2  1.2  1.2   Alkaline Phos 38 - 126 U/L 91  93  104   AST 15 - 41 U/L 15  15  15    ALT 0 - 44 U/L 12  11  13        Microbiology: Bc-NG Studies/Results: NM Pulmonary Perfusion  Result Date: 09/13/2022 CLINICAL DATA:  PE suspected EXAM: NUCLEAR MEDICINE PERFUSION LUNG SCAN TECHNIQUE: Perfusion images were obtained in multiple projections after intravenous injection of radiopharmaceutical. Ventilation scans intentionally deferred if perfusion scan and chest x-ray adequate for interpretation during COVID  19 epidemic. RADIOPHARMACEUTICALS:  4.29 mCi Tc-92m MAA IV COMPARISON:  Same day chest radiograph FINDINGS: Cardiomegaly. Subtly heterogeneous pulmonary perfusion without suspicious peripheral or wedge-shaped perfusion defect. IMPRESSION: 1. Very low probability examination for pulmonary embolism by modified perfusion only PIOPED criteria (PE absent). 2.  Cardiomegaly. Electronically Signed   By: Jearld Lesch M.D.   On: 09/13/2022 18:19   DG Chest 2 View  Result Date: 09/13/2022 CLINICAL DATA:  Chest pain.  Shortness of breath. EXAM: CHEST - 2 VIEW COMPARISON:  Chest radiographs 06/30/2022, 06/27/2022, and 11/13/2021 FINDINGS: Cardiac silhouette is moderately enlarged, similar to prior. Mediastinal contours are grossly within normal limits for low lung volumes and AP technique. There are mildly decreased lung volumes, worsened from prior. There is bibasilar horizontal linear bronchovascular crowding. No definite pulmonary edema. No pleural effusion is seen. No pneumothorax. Moderate multilevel degenerative disc changes of the thoracic spine.  IMPRESSION: 1. Moderate cardiomegaly, similar to prior. 2. Mildly decreased lung volumes with bibasilar bronchovascular crowding. Electronically Signed   By: Neita Garnet M.D.   On: 09/13/2022 13:35     Assessment/Plan: Rt arm cellulitis- likely started at the elbow skin which is lichenified due to leaning on  the elbow constantly Need to r/o olecranon  bursitis Likely organism staph aureus or strep- unlikely to be gram neg On linezolid for better soft tissue penetration and possible antitoxin effect-doing better will stop zosyn soon   CKD   Ca prostate on Xtendi   Has leadless pacemaker   Afib   Dm- management as per primary team    [facility] on call this weekend .Available for urgent issues by phone

## 2022-09-13 NOTE — Progress Notes (Signed)
  Subjective:    Patient reports pain as mild.   Patient is well, and has had no acute complaints or problems.  Improving on elbow use and moving around. Plan is to go Home after hospital stay. Negative for chest pain and shortness of breath Fever: no Gastrointestinal: Negative for nausea and vomiting  Objective: Vital signs in last 24 hours: Temp:  [98.2 F (36.8 C)-98.4 F (36.9 C)] 98.4 F (36.9 C) (08/22 2357) Pulse Rate:  [69-94] 94 (08/23 0536) Resp:  [15-18] 18 (08/22 2357) BP: (102-120)/(64-94) 120/64 (08/23 0536) SpO2:  [92 %-96 %] 92 % (08/23 0536)  Intake/Output from previous day:  Intake/Output Summary (Last 24 hours) at 09/13/2022 0651 Last data filed at 09/12/2022 1800 Gross per 24 hour  Intake 454.23 ml  Output --  Net 454.23 ml    Intake/Output this shift: No intake/output data recorded.  Labs: Recent Labs    09/11/22 0408 09/12/22 0756  HGB 13.3 14.0   Recent Labs    09/11/22 0408 09/12/22 0756  WBC 9.2 7.6  RBC 4.25 4.50  HCT 39.5 42.8  PLT 186 216   Recent Labs    09/11/22 0408 09/12/22 0756  NA 136 138  K 3.6 3.7  CL 107 107  CO2 22 22  BUN 31* 28*  CREATININE 1.57* 1.50*  GLUCOSE 138* 142*  CALCIUM 8.5* 8.6*   No results for input(s): "LABPT", "INR" in the last 72 hours.   EXAM General - Patient is Alert and Oriented Extremity -on right upper extremity exam today his range of motion is -10 degrees to 110 degrees flexion actively and pain-free.  He has no significant discomfort going into the extremes of flexion or extension.  Erythema seems to be diminishing.  Improved wrist motion and the use of his hand.  Improved grip strength.  Mild tenderness involving the olecranon region and elbow joint.   Past Medical History:  Diagnosis Date   Acute deep vein thrombosis (DVT) of femoral vein of left lower extremity (HCC)    Atrial fibrillation (HCC)    CHF (congestive heart failure) (HCC)    Chronic kidney disease    Diabetes (HCC)     Hearing loss    High blood pressure    History of bladder problems    Hyperlipidemia    Kidney stones 11/22/2014   Liver cyst    OSA (obstructive sleep apnea)    Prostate cancer (HCC)    Prostate cancer (HCC)    Stroke (HCC)    Valvular heart disease     Assessment/Plan:    Active Problems:   Uncontrolled type 2 diabetes mellitus with hyperglycemia (HCC)   Type II diabetes mellitus with renal manifestations (HCC)   Acute renal failure superimposed on stage 3a chronic kidney disease (HCC)   Paroxysmal atrial fibrillation (HCC)   Metabolic acidosis   Thrombocytopenia (HCC)   Cellulitis of right arm  Estimated body mass index is 30.68 kg/m as calculated from the following:   Height as of this encounter: 6' (1.829 m).   Weight as of this encounter: 102.6 kg.  Plan:  continue IV antibiotics. No current plan for surgical intervention, based on clinical improvement.   Dedra Skeens, PA-C Orthopaedic Surgery 09/13/2022, 6:51 AM

## 2022-09-14 DIAGNOSIS — N170 Acute kidney failure with tubular necrosis: Secondary | ICD-10-CM | POA: Diagnosis not present

## 2022-09-14 DIAGNOSIS — N1831 Chronic kidney disease, stage 3a: Secondary | ICD-10-CM | POA: Diagnosis not present

## 2022-09-14 DIAGNOSIS — L03113 Cellulitis of right upper limb: Secondary | ICD-10-CM | POA: Diagnosis not present

## 2022-09-14 DIAGNOSIS — I4729 Other ventricular tachycardia: Secondary | ICD-10-CM | POA: Diagnosis not present

## 2022-09-14 LAB — GLUCOSE, CAPILLARY
Glucose-Capillary: 154 mg/dL — ABNORMAL HIGH (ref 70–99)
Glucose-Capillary: 105 mg/dL — ABNORMAL HIGH (ref 70–99)
Glucose-Capillary: 134 mg/dL — ABNORMAL HIGH (ref 70–99)
Glucose-Capillary: 179 mg/dL — ABNORMAL HIGH (ref 70–99)

## 2022-09-14 MED ORDER — POLYETHYLENE GLYCOL 3350 17 G PO PACK
17.0000 g | PACK | Freq: Two times a day (BID) | ORAL | Status: DC
  Administered 2022-09-14 – 2022-09-15 (×2): 17 g via ORAL
  Filled 2022-09-14 (×4): qty 1

## 2022-09-14 MED ORDER — PANTOPRAZOLE SODIUM 40 MG PO TBEC
40.0000 mg | DELAYED_RELEASE_TABLET | Freq: Every day | ORAL | Status: DC
  Administered 2022-09-14 – 2022-09-16 (×3): 40 mg via ORAL
  Filled 2022-09-14 (×3): qty 1

## 2022-09-14 MED ORDER — TIOTROPIUM BROMIDE MONOHYDRATE 18 MCG IN CAPS
18.0000 ug | ORAL_CAPSULE | Freq: Every day | RESPIRATORY_TRACT | Status: DC
  Administered 2022-09-14 – 2022-09-16 (×3): 18 ug via RESPIRATORY_TRACT
  Filled 2022-09-14: qty 5

## 2022-09-14 MED ORDER — FUROSEMIDE 20 MG PO TABS
20.0000 mg | ORAL_TABLET | Freq: Every day | ORAL | Status: DC
  Administered 2022-09-14 – 2022-09-16 (×3): 20 mg via ORAL
  Filled 2022-09-14 (×3): qty 1

## 2022-09-14 MED ORDER — DAPAGLIFLOZIN PROPANEDIOL 10 MG PO TABS
10.0000 mg | ORAL_TABLET | Freq: Every day | ORAL | Status: DC
  Administered 2022-09-14 – 2022-09-16 (×3): 10 mg via ORAL
  Filled 2022-09-14 (×3): qty 1

## 2022-09-14 MED ORDER — VITAMIN B-12 1000 MCG PO TABS
1000.0000 ug | ORAL_TABLET | Freq: Every day | ORAL | Status: DC
  Administered 2022-09-14 – 2022-09-16 (×3): 1000 ug via ORAL
  Filled 2022-09-14 (×3): qty 1

## 2022-09-14 MED ORDER — TIOTROPIUM BROMIDE MONOHYDRATE 2.5 MCG/ACT IN AERS: 2.0000 | INHALATION_SPRAY | Freq: Every day | RESPIRATORY_TRACT | Status: DC

## 2022-09-14 MED ORDER — ATORVASTATIN CALCIUM 20 MG PO TABS
40.0000 mg | ORAL_TABLET | Freq: Every day | ORAL | Status: DC
  Administered 2022-09-14 – 2022-09-16 (×3): 40 mg via ORAL
  Filled 2022-09-14 (×3): qty 2

## 2022-09-14 MED ORDER — ENZALUTAMIDE 40 MG PO TABS: 160.0000 mg | ORAL_TABLET | Freq: Every day | ORAL | Status: DC

## 2022-09-14 NOTE — Progress Notes (Signed)
Prior-To-Admission Oral Chemotherapy for Treatment of Oncologic Disease   Order noted from Dr. Chipper Herb to continue prior-to-admission oral chemotherapy regimen of enzalutamide.  Procedure Per Pharmacy & Therapeutics Committee Policy: Orders for continuation of home oral chemotherapy for treatment of an oncologic disease will be held unless approved by an oncologist during current admission.    For patients receiving oncology care at [facility], inpatient pharmacist contacts patient's oncologist during regular office hours to review. If earlier review is medically necessary, attending physician consults [facility] on-call oncologist   For patients receiving oncology care outside of [facility], attending physician consults patient's oncologist to review. If this oncologist or their coverage cannot be reached, attending physician consults [facility] on-call oncologist   Oral chemotherapy continuation order will be discontinued per [facility] Oncologist, Dr Sabino Donovan, PharmD, BCPS Clinical Pharmacist 09/14/2022 9:02 AM

## 2022-09-14 NOTE — Progress Notes (Signed)
Progress Note   Patient: [name] ZOX:[id number] DOB: 30-Jun-1933 DOA: 09/08/2022     6 DOS: the patient was seen and examined on 09/14/2022   Brief hospital course: LYNDLE VIRGEN is a 89 y.o. male with medical history significant of DVT, Atrial fibrillation , CHF, CKDIIIb-IV , DMII, Hypertension, OSA, prosate ca, CVA , DMII , Hyperlipidemia,who presents to ED with interim history of diagnosis of cellulitis of right arm 2 days ago .  Patient is treated with vancomycin and Rocephin.  CT scan of the right arm did not show any evidence of active abscess.   Active Problems:   Acute renal failure superimposed on stage 3a chronic kidney disease (HCC)   Paroxysmal atrial fibrillation (HCC)   NSVT (nonsustained ventricular tachycardia) (HCC)   Uncontrolled type 2 diabetes mellitus with hyperglycemia (HCC)   Type II diabetes mellitus with renal manifestations (HCC)   Metabolic acidosis   Thrombocytopenia (HCC)   Cellulitis of right upper extremity   Assessment and Plan: Nonsustained ventricular tachycardia. Patient had a 12 beat of nonsustained ventricular tachycardia with a rate of 150-in the morning of 8/23. Based on history, he had prior episodes. He also had some mild chest pain on the lateral right upper chest/axillary, I believe this is a referred pain from the arm.  Troponin has a minimal elevation.  Not consistent with angina.  Pulmonary perfusion scan ruled out PE Echocardiogram showed ejection fraction 55 to 60%, mild pulmonary hypertension. Will continue to monitor.  Cellulitis of right forearm. Patient was treated with vancomycin and Rocephin.  Initially the swelling was better, but got worse on 8/21.Marland Kitchen  Renal function is better, leukocytosis has resolved.  Patient does not have any confusion. Had orthopedic consult, but lower suspicion for necrotizing fasciitis. Patient is on anticoagulation,  duplex ultrasound showed no DVT. Antibiotic to Zosyn in addition to vancomycin  8/21.  Could not get MRI due to pacemaker. Blood culture negative. Patient is seen by ID and podiatry, ID still concerned about olecranon bursitis.  Antibiotics has been switched to linezolid and Zosyn 8/22. Condition greatly improved today, repeat elbow x-ray to look at a joint effusion.   Acute renal failure superimposed on stage III chronic kidney disease. Mild metabolic acidosis. Reviewed prior chart, patient had a chronic kidney disease stage IIIa.  Renal functions better after fluids. .   Paroxysmal atrial fibrillation. History of CVA. Continue anticoagulation and metoprolol.   Chronic diastolic congestive heart failure. Stable, no exacerbation.   Obesity of the BMI 31.41. Diet and exercise.   Obstruct sleep apnea. On nocturnal oxygen.   Uncontrolled type 2 diabetes with hyperglycemia  Stable..       Subjective:  Patient doing much better today, arm swelling much improved.  Physical Exam: Vitals:   09/13/22 1710 09/13/22 2204 09/13/22 2204 09/14/22 0815  BP: 108/76 122/73 122/73 110/68  Pulse: 71 66 66 65  Resp: 16 20 20 18   Temp: 97.9 F (36.6 C) (!) 97.4 F (36.3 C) (!) 97.4 F (36.3 C) 98 F (36.7 C)  TempSrc: Oral Oral Oral Oral  SpO2: 100%  96% 95%  Weight:      Height:       General exam: Appears calm and comfortable  Respiratory system: Clear to auscultation. Respiratory effort normal. Cardiovascular system: S1 & S2 heard, RRR. No JVD, murmurs, rubs, gallops or clicks. No pedal edema. Gastrointestinal system: Abdomen is nondistended, soft and nontender. No organomegaly or masses felt. Normal bowel sounds heard. Central nervous system: Alert and  oriented. No focal neurological deficits. Extremities: Right arm swelling much improved. Skin: No rashes, lesions or ulcers Psychiatry: Judgement and insight appear normal. Mood & affect appropriate.    Data Reviewed:  No new lab results.  Family Communication: Wife updated at the  bedside.  Disposition: Status is: Inpatient Remains inpatient appropriate because: Severity of disease, IV treatment.     Time spent: 35 minutes  Author: Marrion Coy, MD 09/14/2022 11:29 AM  For on call review [email or web address].

## 2022-09-15 ENCOUNTER — Inpatient Hospital Stay: Payer: Medicare Other

## 2022-09-15 DIAGNOSIS — I4729 Other ventricular tachycardia: Secondary | ICD-10-CM | POA: Diagnosis not present

## 2022-09-15 DIAGNOSIS — L03113 Cellulitis of right upper limb: Secondary | ICD-10-CM | POA: Diagnosis not present

## 2022-09-15 DIAGNOSIS — N170 Acute kidney failure with tubular necrosis: Secondary | ICD-10-CM | POA: Diagnosis not present

## 2022-09-15 DIAGNOSIS — I480 Paroxysmal atrial fibrillation: Secondary | ICD-10-CM | POA: Diagnosis not present

## 2022-09-15 LAB — CBC
HCT: 40.8 % (ref 39.0–52.0)
Hemoglobin: 13.2 g/dL (ref 13.0–17.0)
MCH: 31.1 pg (ref 26.0–34.0)
MCHC: 32.4 g/dL (ref 30.0–36.0)
MCV: 96 fL (ref 80.0–100.0)
Platelets: 313 10*3/uL (ref 150–400)
RBC: 4.25 MIL/uL (ref 4.22–5.81)
RDW: 13.7 % (ref 11.5–15.5)
WBC: 6.9 10*3/uL (ref 4.0–10.5)
nRBC: 0 % (ref 0.0–0.2)

## 2022-09-15 LAB — BASIC METABOLIC PANEL
Anion gap: 10 (ref 5–15)
BUN: 26 mg/dL — ABNORMAL HIGH (ref 8–23)
CO2: 24 mmol/L (ref 22–32)
Calcium: 8.8 mg/dL — ABNORMAL LOW (ref 8.9–10.3)
Chloride: 103 mmol/L (ref 98–111)
Creatinine, Ser: 1.8 mg/dL — ABNORMAL HIGH (ref 0.61–1.24)
GFR, Estimated: 36 mL/min — ABNORMAL LOW (ref >60)
Glucose, Bld: 141 mg/dL — ABNORMAL HIGH (ref 70–99)
Potassium: 3.8 mmol/L (ref 3.5–5.1)
Sodium: 137 mmol/L (ref 135–145)

## 2022-09-15 LAB — MAGNESIUM: Magnesium: 2.2 mg/dL (ref 1.7–2.4)

## 2022-09-15 LAB — GLUCOSE, CAPILLARY
Glucose-Capillary: 191 mg/dL — ABNORMAL HIGH (ref 70–99)
Glucose-Capillary: 139 mg/dL — ABNORMAL HIGH (ref 70–99)
Glucose-Capillary: 126 mg/dL — ABNORMAL HIGH (ref 70–99)
Glucose-Capillary: 101 mg/dL — ABNORMAL HIGH (ref 70–99)

## 2022-09-15 NOTE — Progress Notes (Signed)
Progress Note   Patient: [name] ION:[id number] DOB: 1933-04-20 DOA: 09/08/2022     7 DOS: the patient was seen and examined on 09/15/2022   Brief hospital course: SHALIN DREWEK is a 89 y.o. male with medical history significant of DVT, Atrial fibrillation , CHF, CKDIIIb-IV , DMII, Hypertension, OSA, prosate ca, CVA , DMII , Hyperlipidemia,who presents to ED with interim history of diagnosis of cellulitis of right arm 2 days ago .  Patient is treated with vancomycin and Rocephin.  CT scan of the right arm did not show any evidence of active abscess.   Active Problems:   Acute renal failure superimposed on stage 3a chronic kidney disease (HCC)   Paroxysmal atrial fibrillation (HCC)   NSVT (nonsustained ventricular tachycardia) (HCC)   Uncontrolled type 2 diabetes mellitus with hyperglycemia (HCC)   Type II diabetes mellitus with renal manifestations (HCC)   Metabolic acidosis   Thrombocytopenia (HCC)   Cellulitis of right upper extremity   Assessment and Plan:  Nonsustained ventricular tachycardia. Patient had a 12 beat of nonsustained ventricular tachycardia with a rate of 150-in the morning of 8/23. Based on history, he had prior episodes. He also had some mild chest pain on the lateral right upper chest/axillary, I believe this is a referred pain from the arm.  Troponin has a minimal elevation.  Not consistent with angina.  Pulmonary perfusion scan ruled out PE Echocardiogram showed ejection fraction 55 to 60%, mild pulmonary hypertension. No additional episode.   Cellulitis of right forearm. Patient was treated with vancomycin and Rocephin.  Initially the swelling was better, but got worse on 8/21.Marland Kitchen  Renal function is better, leukocytosis has resolved.  Patient does not have any confusion. Had orthopedic consult, but lower suspicion for necrotizing fasciitis. Patient is on anticoagulation,  duplex ultrasound showed no DVT. Antibiotic to Zosyn in addition to vancomycin  8/21.  Could not get MRI due to pacemaker. Blood culture negative. Patient is seen by ID and podiatry, ID still concerned about olecranon bursitis.  Antibiotics has been switched to linezolid and Zosyn 8/22. Patient condition much improved.  Swelling came down. Repeated elbow x-ray showed small joint effusion, no bony involvement. Patient probably will discharge home tomorrow, will touch base with infect disease before discharge.   Acute renal failure superimposed on stage III chronic kidney disease. Mild metabolic acidosis. Reviewed prior chart, patient had a chronic kidney disease stage IIIa.  Renal functions better after fluids. .   Paroxysmal atrial fibrillation. History of CVA. Continue anticoagulation and metoprolol.   Chronic diastolic congestive heart failure. Stable, no exacerbation.   Obesity of the BMI 31.41. Diet and exercise.   Obstruct sleep apnea. On nocturnal oxygen.   Uncontrolled type 2 diabetes with hyperglycemia  Stable..        Subjective:  Right arm swelling much improved today.  Physical Exam: Vitals:   09/14/22 2357 09/15/22 0823 09/15/22 0927 09/15/22 0929  BP: 124/75 (!) 96/58  105/62  Pulse: 70 77    Resp: 16 16    Temp: 98.7 F (37.1 C) 98.1 F (36.7 C)    TempSrc:      SpO2: 98% 95% 97%   Weight:      Height:       General exam: Appears calm and comfortable  Respiratory system: Clear to auscultation. Respiratory effort normal. Cardiovascular system: S1 & S2 heard, RRR. No JVD, murmurs, rubs, gallops or clicks. No pedal edema. Gastrointestinal system: Abdomen is nondistended, soft and nontender. No organomegaly or  masses felt. Normal bowel sounds heard. Central nervous system: Alert and oriented. No focal neurological deficits. Extremities: Right arm m. Skin: No rashes, lesions or ulcers Psychiatry: Judgement and insight appear normal. Mood & affect appropriate.    Data Reviewed:  Lab results reviewed.  Family Communication:  None  Disposition: Status is: Inpatient Remains inpatient appropriate because: Severity of disease, IV treatment.     Time spent: 35 minutes  Author: Marrion Coy, MD 09/15/2022 12:25 PM  For on call review [email or web address].

## 2022-09-15 NOTE — Plan of Care (Signed)

## 2022-09-16 ENCOUNTER — Other Ambulatory Visit: Payer: Self-pay | Admitting: *Deleted

## 2022-09-16 DIAGNOSIS — L03113 Cellulitis of right upper limb: Secondary | ICD-10-CM | POA: Diagnosis not present

## 2022-09-16 DIAGNOSIS — I480 Paroxysmal atrial fibrillation: Secondary | ICD-10-CM | POA: Diagnosis not present

## 2022-09-16 DIAGNOSIS — N1831 Chronic kidney disease, stage 3a: Secondary | ICD-10-CM | POA: Diagnosis not present

## 2022-09-16 DIAGNOSIS — N170 Acute kidney failure with tubular necrosis: Secondary | ICD-10-CM | POA: Diagnosis not present

## 2022-09-16 LAB — GLUCOSE, CAPILLARY: Glucose-Capillary: 135 mg/dL — ABNORMAL HIGH (ref 70–99)

## 2022-09-16 MED ORDER — AMOXICILLIN-POT CLAVULANATE 875-125 MG PO TABS: 1.0000 | ORAL_TABLET | Freq: Two times a day (BID) | ORAL | 0 refills | Status: DC

## 2022-09-16 NOTE — Consult Note (Signed)
[facility] Customer service manager [facility]) Accountable Care Organization (ACO) [facility] Liaison Note  09/16/2022  [name] 23-Dec-1933 [id number]  Location: [facility] RN Hospital Liaison met patient at bedside at [facility].  Insurance: [employer]   [name] is a 89 y.o. male who is a Primary Care Patient of [name], NP. The patient was screened for  readmission hospitalization with noted extreme risk score for unplanned readmission risk with 2 IP/2 ED in 6 months.  The patient was assessed for potential [facility]) Care Management service needs for post hospital transition for care coordination. Review of patient's electronic medical record reveals patient was admitted with Acute renal failure superimposed on Stage 3a chronic kidney disease. Liaison spoke with pt with wife at bedside and verified his primary provider as noted above. Educated on available care management services and offered a follow up call with a care coordinator to assist further with managing his care for post hospital prevention readmission measures. Pt receptive to this service.  Plan: [facility] Liaison will continue to follow progress and disposition to asess for post hospital community care coordination/management needs.  Referral request for community care coordination: Will make a referral for care coordination services.   [facility] Care Management/Population Health does not replace or interfere with any arrangements made by the Inpatient Transition of Care team.   For questions contact:   Elliot Cousin, RN, [facility] Liaison    Population Health Office Hours MTWF  8:00 am-6:00 pm [phone] mobile [phone] [Office toll free line] Office Hours are M-F 8:30 - 5 pm [email or web address]

## 2022-09-16 NOTE — Discharge Summary (Signed)
Physician Discharge Summary   Patient: [name] MRN: [id number] DOB: 18-Mar-1933  Admit date:     09/08/2022  Discharge date: 09/16/22  Discharge Physician: Marrion Coy   PCP: [name], NP   Recommendations at discharge:   Follow-up with PCP in 1 week. Follow-up with osteopenia surgery in 1 week.  Discharge Diagnoses: Active Problems:   Acute renal failure superimposed on stage 3a chronic kidney disease (HCC)   Paroxysmal atrial fibrillation (HCC)   NSVT (nonsustained ventricular tachycardia) (HCC)   Uncontrolled type 2 diabetes mellitus with hyperglycemia (HCC)   Type II diabetes mellitus with renal manifestations (HCC)   Metabolic acidosis   Thrombocytopenia (HCC)   Cellulitis of right upper extremity  Resolved Problems:   * No resolved hospital problems. *  Hospital Course: [name] is a 89 y.o. male with medical history significant of DVT, Atrial fibrillation , CHF, CKDIIIb-IV , DMII, Hypertension, OSA, prosate ca, CVA , DMII , Hyperlipidemia,who presents to ED with interim history of diagnosis of cellulitis of right arm 2 days ago .  Patient is treated with vancomycin and Rocephin.  CT scan of the right arm did not show any evidence of active abscess. Patient is seen by ID, antibiotics were changed to Zosyn and Zyvox.  Repeated elbow x-ray and it showed minimal effusion.  Discussed with Dr. Allena Katz from orthopedics, patient is medically stable for discharge.  He will follow-up patient in office. Also discussed with ID, patient will be treated with Augmentin for additional 7 days. Assessment and Plan: Nonsustained ventricular tachycardia. Patient had a 12 beat of nonsustained ventricular tachycardia with a rate of 150-in the morning of 8/23. Based on history, he had prior episodes. He also had some mild chest pain on the lateral right upper chest/axillary, I believe this is a referred pain from the arm.  Troponin has a minimal elevation.  Not consistent  with angina.  Pulmonary perfusion scan ruled out PE Echocardiogram showed ejection fraction 55 to 60%, mild pulmonary hypertension. Condition happened in the setting of worsening cellulitis symptoms, no recurrence since then.  Can follow-up with PCP as outpatient.   Cellulitis of right forearm. Patient was treated with vancomycin and Rocephin.  Initially the swelling was better, but got worse on 8/21.Marland Kitchen  Renal function is better, leukocytosis has resolved.  Patient does not have any confusion. Had orthopedic consult, but lower suspicion for necrotizing fasciitis. Patient is on anticoagulation,  duplex ultrasound showed no DVT. Antibiotic to Zosyn in addition to vancomycin 8/21.  Could not get MRI due to pacemaker. Blood culture negative. Patient is seen by ID and podiatry, ID still concerned about olecranon bursitis.  Antibiotics has been switched to linezolid and Zosyn 8/22. Patient condition much improved.  Swelling came down. Repeated elbow x-ray showed small joint effusion, no bony involvement. Discussed with ID and orthopedics, oral antibiotics.  Medically stable to be discharged.   Acute renal failure superimposed on stage III chronic kidney disease. Mild metabolic acidosis. Reviewed prior chart, patient had a chronic kidney disease stage IIIa.  Renal function is better. Metabolic acidosis resolved.   Paroxysmal atrial fibrillation. History of CVA. Continue anticoagulation and metoprolol.   Chronic diastolic congestive heart failure. Stable, no exacerbation.   Obesity of the BMI 31.41. Diet and exercise.   Obstruct sleep apnea. On nocturnal oxygen.   Uncontrolled type 2 diabetes with hyperglycemia  Stable..          Consultants: ID and orthopedics. Procedures performed: None  Disposition: Home Diet recommendation:  Discharge Diet Orders (From admission, onward)     Start     Ordered   09/16/22 0000  Diet - low sodium heart healthy        09/16/22 6578            Cardiac diet DISCHARGE MEDICATION: Allergies as of 09/16/2022   No Known Allergies      Medication List     STOP taking these medications    colchicine 0.6 MG tablet   doxycycline 100 MG capsule Commonly known as: VIBRAMYCIN   ferrous sulfate 325 (65 FE) MG tablet   sucralfate 1 g tablet Commonly known as: Carafate       TAKE these medications    albuterol 108 (90 Base) MCG/ACT inhaler Commonly known as: VENTOLIN HFA Inhale 2 puffs into the lungs every 4 (four) hours as needed for wheezing or shortness of breath.   amoxicillin-clavulanate 875-125 MG tablet Commonly known as: AUGMENTIN Take 1 tablet by mouth 2 (two) times daily for 7 days.   atorvastatin 10 MG tablet Commonly known as: LIPITOR TAKE 1 TABLET(10 MG) BY MOUTH DAILY   cyanocobalamin 1000 MCG tablet Commonly known as: VITAMIN B12 Take 1 tablet (1,000 mcg total) by mouth daily.   dapagliflozin propanediol 10 MG Tabs tablet Commonly known as: FARXIGA Take 1 tablet (10 mg total) by mouth daily.   edoxaban 30 MG Tabs tablet Commonly known as: SAVAYSA Take 30 mg by mouth daily.   furosemide 20 MG tablet Commonly known as: LASIX Take 1 tablet (20 mg total) by mouth daily.   glipiZIDE 2.5 MG 24 hr tablet Commonly known as: GLUCOTROL XL Take 1 tablet (2.5 mg total) by mouth daily with breakfast.   Metoprolol Tartrate 37.5 MG Tabs Take 37.5 mg by mouth 2 (two) times daily.   OneTouch Verio test strip Generic drug: glucose blood USE ONCE DAILY   pantoprazole 40 MG tablet Commonly known as: Protonix Take 1 tablet (40 mg total) by mouth daily.   polyethylene glycol 17 g packet Commonly known as: MIRALAX / GLYCOLAX Take 17 g by mouth 2 (two) times daily. Skip the dose if no constipation   Spiriva Respimat 2.5 MCG/ACT Aers Generic drug: Tiotropium Bromide Monohydrate INHALE 2 PUFFS INTO THE LUNGS DAILY   Xtandi 40 MG tablet Generic drug: enzalutamide Take 4 tablets (160 mg total) by  mouth daily.        Follow-up Information     Abernathy, Arlyss Repress, NP Follow up in 1 week(s).   Specialty: Nurse Practitioner Contact information: [address] [place] [address] [phone]         Signa Kell, MD Follow up in 1 week(s).   Specialty: Orthopedic Surgery Contact information: [address] [place] [address] [phone]                Discharge Exam: Filed Weights   09/08/22 1012 09/09/22 0500 09/12/22 0500  Weight: 109 kg 105 kg 102.6 kg   General exam: Appears calm and comfortable  Respiratory system: Clear to auscultation. Respiratory effort normal. Cardiovascular system: S1 & S2 heard, RRR. No JVD, murmurs, rubs, gallops or clicks. No pedal edema. Gastrointestinal system: Abdomen is nondistended, soft and nontender. No organomegaly or masses felt. Normal bowel sounds heard. Central nervous system: Alert and oriented. No focal neurological deficits. Extremities: Right arm swelling much improved. Skin: No rashes, lesions or ulcers Psychiatry: Judgement and insight appear normal. Mood & affect appropriate.     Condition at discharge: good  The  results of significant diagnostics from this hospitalization (including imaging, microbiology, ancillary and laboratory) are listed below for reference.   Imaging Studies: DG Elbow 2 Views Right  Result Date: 09/15/2022 CLINICAL DATA:  Right upper extremity cellulitis.  Joint effusion. EXAM: RIGHT ELBOW - 2 VIEW COMPARISON:  CT of the right elbow 09/11/2022 FINDINGS: The right elbow is located. Degenerative changes are again noted. Extensive soft tissue edema is again noted. A small joint effusion is noted. No acute osseous abnormalities are present. IMPRESSION: 1. Extensive soft tissue edema. 2. Small joint effusion. 3. No acute osseous abnormality. Electronically Signed   By: Marin Roberts M.D.   On: 09/15/2022 11:09   NM Pulmonary Perfusion  Result Date: 09/13/2022 CLINICAL  DATA:  PE suspected EXAM: NUCLEAR MEDICINE PERFUSION LUNG SCAN TECHNIQUE: Perfusion images were obtained in multiple projections after intravenous injection of radiopharmaceutical. Ventilation scans intentionally deferred if perfusion scan and chest x-ray adequate for interpretation during COVID 19 epidemic. RADIOPHARMACEUTICALS:  4.29 mCi Tc-62m MAA IV COMPARISON:  Same day chest radiograph FINDINGS: Cardiomegaly. Subtly heterogeneous pulmonary perfusion without suspicious peripheral or wedge-shaped perfusion defect. IMPRESSION: 1. Very low probability examination for pulmonary embolism by modified perfusion only PIOPED criteria (PE absent). 2.  Cardiomegaly. Electronically Signed   By: Jearld Lesch M.D.   On: 09/13/2022 18:19   DG Chest 2 View  Result Date: 09/13/2022 CLINICAL DATA:  Chest pain.  Shortness of breath. EXAM: CHEST - 2 VIEW COMPARISON:  Chest radiographs 06/30/2022, 06/27/2022, and 11/13/2021 FINDINGS: Cardiac silhouette is moderately enlarged, similar to prior. Mediastinal contours are grossly within normal limits for low lung volumes and AP technique. There are mildly decreased lung volumes, worsened from prior. There is bibasilar horizontal linear bronchovascular crowding. No definite pulmonary edema. No pleural effusion is seen. No pneumothorax. Moderate multilevel degenerative disc changes of the thoracic spine. IMPRESSION: 1. Moderate cardiomegaly, similar to prior. 2. Mildly decreased lung volumes with bibasilar bronchovascular crowding. Electronically Signed   By: Neita Garnet M.D.   On: 09/13/2022 13:35   CT ELBOW RIGHT WO CONTRAST  Result Date: 09/12/2022 CLINICAL DATA:  Septic arthritis suspected, elbow.  Cellulitis EXAM: CT OF THE UPPER RIGHT EXTREMITY WITHOUT CONTRAST TECHNIQUE: Multidetector CT imaging of the upper right extremity was performed according to the standard protocol. RADIATION DOSE REDUCTION: This exam was performed according to the departmental dose-optimization  program which includes automated exposure control, adjustment of the mA and/or kV according to patient size and/or use of iterative reconstruction technique. COMPARISON:  09/09/2022 FINDINGS: Bones/Joint/Cartilage Right elbow intact without fracture or dislocation. Similar mild to moderate degenerative changes, most notable at the ulnohumeral articulation. Possible developing cortical erosion along the posteromedial aspect of the olecranon (series 12, images 59-61). Small elbow joint effusion, nonspecific. Ligaments Suboptimally assessed by CT. Muscles and Tendons No acute musculotendinous abnormality by CT. No well-defined intramuscular fluid collection. Soft tissues Extensive edema and ill-defined fluid throughout the soft tissues of the distal upper arm, elbow, and proximal forearm. The degree of swelling and edema appears to have progressed when compared to the previous CT. No well-defined or drainable fluid collection on non contrasted images. No soft tissue gas. IMPRESSION: 1. Extensive edema and ill-defined fluid throughout the soft tissues of the distal upper arm, elbow, and proximal forearm. The degree of swelling and edema appears to have progressed when compared to the previous CT. No well-defined or drainable fluid collection on non contrasted images. No soft tissue gas. 2. Possible developing cortical erosion along the posteromedial aspect of the  olecranon concerning for osteomyelitis. Consider further evaluation with MRI of the right elbow with and without contrast. 3. Small elbow joint effusion, nonspecific. Septic arthritis not excluded. Electronically Signed   By: Duanne Guess D.O.   On: 09/12/2022 08:22   US Venous Img Upper Uni Right(DVT)  Result Date: 09/11/2022 CLINICAL DATA:  Right upper extremity swelling and cellulitis. History of DVT, on anticoagulation. Evaluate for DVT. EXAM: RIGHT UPPER EXTREMITY VENOUS DOPPLER ULTRASOUND TECHNIQUE: Gray-scale sonography with graded compression,  as well as color Doppler and duplex ultrasound were performed to evaluate the upper extremity deep venous system from the level of the subclavian vein and including the jugular, axillary, basilic, radial, ulnar and upper cephalic vein. Spectral Doppler was utilized to evaluate flow at rest and with distal augmentation maneuvers. COMPARISON:  None Available. FINDINGS: Contralateral Subclavian Vein: Respiratory phasicity is normal and symmetric with the symptomatic side. No evidence of thrombus. Normal compressibility. Internal Jugular Vein: No evidence of thrombus. Normal compressibility, respiratory phasicity and response to augmentation. Subclavian Vein: No evidence of thrombus. Normal compressibility, respiratory phasicity and response to augmentation. Axillary Vein: No evidence of thrombus. Normal compressibility, respiratory phasicity and response to augmentation. Cephalic Vein: No evidence of thrombus. Normal compressibility, respiratory phasicity and response to augmentation. Basilic Vein: No evidence of thrombus. Normal compressibility, respiratory phasicity and response to augmentation. Brachial Veins: No evidence of thrombus. Normal compressibility, respiratory phasicity and response to augmentation. Radial Veins: No evidence of thrombus. Normal compressibility, respiratory phasicity and response to augmentation. Ulnar Veins: No evidence of thrombus. Normal compressibility, respiratory phasicity and response to augmentation. Other Findings: There is a moderate amount of subcutaneous edema at the level of the right forearm. IMPRESSION: No evidence of DVT within the right upper extremity. Electronically Signed   By: Simonne Come M.D.   On: 09/11/2022 16:18   CT Extrem Up Entire Arm R W/CM  Result Date: 09/09/2022 CLINICAL DATA:  Soft tissue infection suspected, forearm. Right arm cellulitis diagnosed 2 days ago. Patient started on doxycycline. Despite being compliant with therapy, patient has noted  increased pain and continued swelling and redness. Pain in right arm around elbow. Patient also noted in abrasion next to area of interest. EXAM: CT OF THE UPPER RIGHT EXTREMITY WITH CONTRAST TECHNIQUE: Multidetector CT imaging of the upper right extremity was performed according to the standard protocol following intravenous contrast administration. RADIATION DOSE REDUCTION: This exam was performed according to the departmental dose-optimization program which includes automated exposure control, adjustment of the mA and/or kV according to patient size and/or use of iterative reconstruction technique. CONTRAST:  70mL OMNIPAQUE IOHEXOL 300 MG/ML  SOLN COMPARISON:  CT right upper extremity without contrast 09/08/2022 FINDINGS: Please note recent CT of the same region of the right upper extremity was performed last night without IV contrast, 09/08/2022. The current study is with contrast. Bones/Joint/Cartilage Osteoarthritis is again seen within the right hand, greatest at the thumb carpometacarpal, thumb metacarpophalangeal, triscaphe, and DIP joints. Mild-to-moderate degenerative changes of the medial elbow are again noted. Moderate glenohumeral and acromioclavicular osteoarthritis. No acute fracture or dislocation.  No cortical erosion. There is anterior right sacroiliac joint bridging degenerative osteophytosis. Ligaments Suboptimally assessed by CT. Muscles and Tendons Normal size and density of the right arm musculature. No gross tendon tear is seen. Soft tissues There is again moderate edema and mild swelling of the predominantly dorsal and ulnar forearm subcutaneous fat, greatest at the posterior elbow no walled-off rim enhancing abscess is seen IMPRESSION: 1. Moderate edema and mild swelling  of the predominantly dorsal and ulnar forearm subcutaneous fat, greatest at the posterior elbow. Not significantly changed from last night's CT. No walled-off rim enhancing abscess is seen. 2. No acute osseous abnormality.  3. Osteoarthritis of the right hand, right shoulder, and right elbow. Electronically Signed   By: Neita Garnet M.D.   On: 09/09/2022 09:24   CT Extrem Up Entire Arm R WO/CM  Result Date: 09/09/2022 CLINICAL DATA:  Soft tissue infection suspected, forearm. No prior imaging. EXAM: CT OF THE UPPER RIGHT EXTREMITY WITHOUT CONTRAST TECHNIQUE: Multidetector CT imaging of the upper right extremity was performed according to the standard protocol. RADIATION DOSE REDUCTION: This exam was performed according to the departmental dose-optimization program which includes automated exposure control, adjustment of the mA and/or kV according to patient size and/or use of iterative reconstruction technique. COMPARISON:  None Available. FINDINGS: Bones/Joint/Cartilage Moderate glenohumeral joint space narrowing. Moderate anterior glenoid degenerative osteophytosis. Moderate to severe thumb carpometacarpal, moderate triscaphe, and mild-to-moderate second through fifth DIP osteoarthritis including joint space narrowing, subchondral sclerosis, and peripheral osteophytosis. There are well-circumscribed lucency is within multiple carpals, likely chronic/degenerative. A larger 10 mm well-circumscribed lucency within the capitate may represent a degenerative cyst versus a benign lesion such as an enchondroma. Mild to moderate medial elbow joint space narrowing with subchondral cystic change within the coronoid process and peripheral degenerative spurring within the coronoid process and olecranon. No acute fracture.  No cortical erosion. Ligaments Suboptimally assessed by CT. Muscles and Tendons Normal density and size of the regional musculature. No gross tendon tear is visualized. Soft tissues There is moderate edema and swelling of the posterior medial/ulnar greater than radial forearm subcutaneous fat, greatest at the posterior elbow and posterior proximal ulna (axial series 8, image 195 and sagittal series 11, image 101). No  definite walled-off abscess is seen. No gross abnormality is seen within the partially visualized lungs. Mild-to-moderate multilevel degenerative disc and facet joint changes of the visualized cervical and thoracic spine. IMPRESSION: 1. Moderate edema and swelling of the posterior medial/ulnar greater than radial forearm subcutaneous fat, greatest at the posterior elbow and posterior proximal ulna. This is compatible with cellulitis. No definite walled-off abscess is seen. 2. No acute fracture. No cortical erosion. 3. Moderate glenohumeral, moderate to severe thumb carpometacarpal, moderate triscaphe, and mild-to-moderate second through fifth DIP osteoarthritis. 4. Mild-to-moderate medial elbow osteoarthritis. Electronically Signed   By: Neita Garnet M.D.   On: 09/09/2022 09:02    Microbiology: Results for orders placed or performed during the hospital encounter of 09/08/22  Culture, blood (Routine x 2)     Status: None   Collection Time: 09/08/22 10:16 AM   Specimen: BLOOD  Result Value Ref Range Status   Specimen Description BLOOD LEFT ARM  Final   Special Requests   Final    BOTTLES DRAWN AEROBIC AND ANAEROBIC Blood Culture adequate volume   Culture   Final    NO GROWTH 5 DAYS Performed at [facility], [address], [place], [place] [address]    Report Status 09/13/2022 FINAL  Final  Culture, blood (Routine x 2)     Status: None   Collection Time: 09/08/22  1:23 PM   Specimen: BLOOD  Result Value Ref Range Status   Specimen Description BLOOD RIGHT ANTECUBITAL  Final   Special Requests   Final    BOTTLES DRAWN AEROBIC AND ANAEROBIC Blood Culture adequate volume   Culture   Final    NO GROWTH 5 DAYS Performed at [facility],  [address], [place], [place] [address]    Report Status 09/13/2022 FINAL  Final    Labs: CBC: Recent Labs  Lab 09/10/22 0322 09/11/22 0408 09/12/22 0756 09/15/22 0521  WBC 10.6* 9.2 7.6 6.9  HGB 13.9 13.3 14.0 13.2  HCT 41.9  39.5 42.8 40.8  MCV 94.6 92.9 95.1 96.0  PLT 182 186 216 313   Basic Metabolic Panel: Recent Labs  Lab 09/10/22 0322 09/11/22 0408 09/12/22 0756 09/15/22 0521  NA 138 136 138 137  K 3.6 3.6 3.7 3.8  CL 108 107 107 103  CO2 21* 22 22 24   GLUCOSE 143* 138* 142* 141*  BUN 32* 31* 28* 26*  CREATININE 1.74* 1.57* 1.50* 1.80*  CALCIUM 8.9 8.5* 8.6* 8.8*  MG 2.2 2.2  --  2.2   Liver Function Tests: Recent Labs  Lab 09/10/22 0322 09/11/22 0408 09/12/22 0756  AST 15 15 15   ALT 13 11 12   ALKPHOS 104 93 91  BILITOT 1.2 1.2 1.2  PROT 7.3 7.0 6.7  ALBUMIN 2.9* 2.7* 2.5*   CBG: Recent Labs  Lab 09/15/22 0859 09/15/22 1133 09/15/22 1727 09/15/22 2111 09/16/22 0752  GLUCAP 191* 139* 126* 101* 135*    Discharge time spent: greater than 30 minutes.  Signed: Marrion Coy, MD [facility] 09/16/2022

## 2022-09-16 NOTE — Care Management Important Message (Signed)
Important Message  Patient Details  Name: [name] MRN: [id number] Date of Birth: 03/31/1933   Medicare Important Message Given:  Yes     Verita Schneiders Halyn Flaugher 09/16/2022, 9:41 AM

## 2022-09-16 NOTE — Plan of Care (Signed)

## 2022-09-16 NOTE — Plan of Care (Signed)

## 2022-09-16 NOTE — Progress Notes (Signed)
Discharge instructions reviewed with patient and wife. All questions was answered. They verbalized they understood discharge instructions.

## 2022-09-16 NOTE — Progress Notes (Signed)
DISCHARGE NOTE:    Pt IV removed and discharge  instructions given by Rosey Bath. Pt wheeled down to medical mall entrance and no further questions or concerns at this time. Pt's wife provided transportation.

## 2022-09-17 ENCOUNTER — Telehealth: Payer: Self-pay | Admitting: Nurse Practitioner

## 2022-09-17 ENCOUNTER — Other Ambulatory Visit (HOSPITAL_COMMUNITY): Payer: Self-pay

## 2022-09-17 ENCOUNTER — Telehealth: Payer: Self-pay | Admitting: *Deleted

## 2022-09-17 NOTE — Telephone Encounter (Signed)
Attempted to call patient to schedule hospital follow up. No answer. Phone just rings-[name]

## 2022-09-17 NOTE — Progress Notes (Signed)
  Care Coordination   Note   09/17/2022 Name: ZACOREY SASO MRN: [id number] DOB: Oct 11, 1933  [name] is a 89 y.o. year old male who sees [name], NP for primary care. I reached out to [name] by phone today to offer care coordination services.  Mr. Tubridy was given information about Care Coordination services today including:   The Care Coordination services include support from the care team which includes your Nurse Coordinator, Clinical Social Worker, or Pharmacist.  The Care Coordination team is here to help remove barriers to the health concerns and goals most important to you. Care Coordination services are voluntary, and the patient may decline or stop services at any time by request to their care team member.   Care Coordination Consent Status: Patient agreed to services and verbal consent obtained.   Follow up plan:  Telephone appointment with care coordination team member scheduled for:  09/24/2022  Encounter Outcome:  Pt. Scheduled from referral   Burman Nieves, [facility] Care Coordination Care Guide Direct Dial: [phone]

## 2022-09-19 ENCOUNTER — Inpatient Hospital Stay: Payer: Medicare Other | Admitting: Nurse Practitioner

## 2022-09-20 DIAGNOSIS — G4733 Obstructive sleep apnea (adult) (pediatric): Secondary | ICD-10-CM | POA: Diagnosis not present

## 2022-09-21 ENCOUNTER — Other Ambulatory Visit: Payer: Self-pay

## 2022-09-21 ENCOUNTER — Emergency Department: Payer: Medicare Other

## 2022-09-21 ENCOUNTER — Inpatient Hospital Stay
Admission: EM | Admit: 2022-09-21 | Discharge: 2022-09-24 | DRG: 190 | Disposition: A | Discharge status: Home Health | Payer: Medicare Other | Attending: Internal Medicine | Admitting: Internal Medicine

## 2022-09-21 DIAGNOSIS — L03113 Cellulitis of right upper limb: Secondary | ICD-10-CM | POA: Diagnosis not present

## 2022-09-21 DIAGNOSIS — Z743 Need for continuous supervision: Secondary | ICD-10-CM | POA: Diagnosis not present

## 2022-09-21 DIAGNOSIS — Z7984 Long term (current) use of oral hypoglycemic drugs: Secondary | ICD-10-CM

## 2022-09-21 DIAGNOSIS — Z87442 Personal history of urinary calculi: Secondary | ICD-10-CM

## 2022-09-21 DIAGNOSIS — Z79899 Other long term (current) drug therapy: Secondary | ICD-10-CM

## 2022-09-21 DIAGNOSIS — Z8042 Family history of malignant neoplasm of prostate: Secondary | ICD-10-CM | POA: Diagnosis not present

## 2022-09-21 DIAGNOSIS — Z1152 Encounter for screening for COVID-19: Secondary | ICD-10-CM | POA: Diagnosis not present

## 2022-09-21 DIAGNOSIS — I639 Cerebral infarction, unspecified: Secondary | ICD-10-CM

## 2022-09-21 DIAGNOSIS — N184 Chronic kidney disease, stage 4 (severe): Secondary | ICD-10-CM | POA: Diagnosis present

## 2022-09-21 DIAGNOSIS — I5030 Unspecified diastolic (congestive) heart failure: Secondary | ICD-10-CM | POA: Diagnosis not present

## 2022-09-21 DIAGNOSIS — I8290 Acute embolism and thrombosis of unspecified vein: Secondary | ICD-10-CM | POA: Diagnosis not present

## 2022-09-21 DIAGNOSIS — I482 Chronic atrial fibrillation, unspecified: Secondary | ICD-10-CM

## 2022-09-21 DIAGNOSIS — Z6832 Body mass index (BMI) 32.0-32.9, adult: Secondary | ICD-10-CM

## 2022-09-21 DIAGNOSIS — J9811 Atelectasis: Secondary | ICD-10-CM | POA: Diagnosis present

## 2022-09-21 DIAGNOSIS — I480 Paroxysmal atrial fibrillation: Secondary | ICD-10-CM | POA: Diagnosis present

## 2022-09-21 DIAGNOSIS — Z8673 Personal history of transient ischemic attack (TIA), and cerebral infarction without residual deficits: Secondary | ICD-10-CM

## 2022-09-21 DIAGNOSIS — R0689 Other abnormalities of breathing: Secondary | ICD-10-CM | POA: Diagnosis not present

## 2022-09-21 DIAGNOSIS — I10 Essential (primary) hypertension: Secondary | ICD-10-CM | POA: Diagnosis present

## 2022-09-21 DIAGNOSIS — G4733 Obstructive sleep apnea (adult) (pediatric): Secondary | ICD-10-CM | POA: Diagnosis present

## 2022-09-21 DIAGNOSIS — Z8546 Personal history of malignant neoplasm of prostate: Secondary | ICD-10-CM

## 2022-09-21 DIAGNOSIS — Z87891 Personal history of nicotine dependence: Secondary | ICD-10-CM

## 2022-09-21 DIAGNOSIS — I517 Cardiomegaly: Secondary | ICD-10-CM | POA: Diagnosis not present

## 2022-09-21 DIAGNOSIS — N1832 Chronic kidney disease, stage 3b: Secondary | ICD-10-CM | POA: Diagnosis present

## 2022-09-21 DIAGNOSIS — F419 Anxiety disorder, unspecified: Secondary | ICD-10-CM | POA: Diagnosis present

## 2022-09-21 DIAGNOSIS — Z833 Family history of diabetes mellitus: Secondary | ICD-10-CM

## 2022-09-21 DIAGNOSIS — J441 Chronic obstructive pulmonary disease with (acute) exacerbation: Principal | ICD-10-CM

## 2022-09-21 DIAGNOSIS — R0603 Acute respiratory distress: Secondary | ICD-10-CM | POA: Diagnosis not present

## 2022-09-21 DIAGNOSIS — Z7901 Long term (current) use of anticoagulants: Secondary | ICD-10-CM | POA: Diagnosis not present

## 2022-09-21 DIAGNOSIS — R069 Unspecified abnormalities of breathing: Secondary | ICD-10-CM | POA: Diagnosis not present

## 2022-09-21 DIAGNOSIS — H9190 Unspecified hearing loss, unspecified ear: Secondary | ICD-10-CM | POA: Diagnosis present

## 2022-09-21 DIAGNOSIS — J984 Other disorders of lung: Secondary | ICD-10-CM | POA: Diagnosis not present

## 2022-09-21 DIAGNOSIS — I499 Cardiac arrhythmia, unspecified: Secondary | ICD-10-CM | POA: Diagnosis not present

## 2022-09-21 DIAGNOSIS — I130 Hypertensive heart and chronic kidney disease with heart failure and stage 1 through stage 4 chronic kidney disease, or unspecified chronic kidney disease: Secondary | ICD-10-CM | POA: Diagnosis not present

## 2022-09-21 DIAGNOSIS — E669 Obesity, unspecified: Secondary | ICD-10-CM | POA: Diagnosis present

## 2022-09-21 DIAGNOSIS — E1129 Type 2 diabetes mellitus with other diabetic kidney complication: Secondary | ICD-10-CM | POA: Diagnosis present

## 2022-09-21 DIAGNOSIS — E1122 Type 2 diabetes mellitus with diabetic chronic kidney disease: Secondary | ICD-10-CM | POA: Diagnosis not present

## 2022-09-21 DIAGNOSIS — J9601 Acute respiratory failure with hypoxia: Secondary | ICD-10-CM

## 2022-09-21 DIAGNOSIS — J449 Chronic obstructive pulmonary disease, unspecified: Secondary | ICD-10-CM | POA: Diagnosis present

## 2022-09-21 DIAGNOSIS — I5032 Chronic diastolic (congestive) heart failure: Secondary | ICD-10-CM | POA: Diagnosis not present

## 2022-09-21 DIAGNOSIS — Z86718 Personal history of other venous thrombosis and embolism: Secondary | ICD-10-CM

## 2022-09-21 DIAGNOSIS — E785 Hyperlipidemia, unspecified: Secondary | ICD-10-CM

## 2022-09-21 LAB — CBC WITH DIFFERENTIAL/PLATELET
Abs Immature Granulocytes: 0.03 10*3/uL (ref 0.00–0.07)
Basophils Absolute: 0 10*3/uL (ref 0.0–0.1)
Basophils Relative: 0 %
Eosinophils Absolute: 0.1 10*3/uL (ref 0.0–0.5)
Eosinophils Relative: 1 %
HCT: 40.7 % (ref 39.0–52.0)
Hemoglobin: 13.2 g/dL (ref 13.0–17.0)
Immature Granulocytes: 0 %
Lymphocytes Relative: 36 %
Lymphs Abs: 2.6 10*3/uL (ref 0.7–4.0)
MCH: 30.9 pg (ref 26.0–34.0)
MCHC: 32.4 g/dL (ref 30.0–36.0)
MCV: 95.3 fL (ref 80.0–100.0)
Monocytes Absolute: 1 10*3/uL (ref 0.1–1.0)
Monocytes Relative: 14 %
Neutro Abs: 3.6 10*3/uL (ref 1.7–7.7)
Neutrophils Relative %: 49 %
Platelets: 356 10*3/uL (ref 150–400)
RBC: 4.27 MIL/uL (ref 4.22–5.81)
RDW: 13.9 % (ref 11.5–15.5)
WBC: 7.4 10*3/uL (ref 4.0–10.5)
nRBC: 0 % (ref 0.0–0.2)

## 2022-09-21 LAB — COMPREHENSIVE METABOLIC PANEL
ALT: 13 U/L (ref 0–44)
AST: 22 U/L (ref 15–41)
Albumin: 3 g/dL — ABNORMAL LOW (ref 3.5–5.0)
Alkaline Phosphatase: 105 U/L (ref 38–126)
Anion gap: 14 (ref 5–15)
BUN: 33 mg/dL — ABNORMAL HIGH (ref 8–23)
CO2: 23 mmol/L (ref 22–32)
Calcium: 9 mg/dL (ref 8.9–10.3)
Chloride: 100 mmol/L (ref 98–111)
Creatinine, Ser: 2.15 mg/dL — ABNORMAL HIGH (ref 0.61–1.24)
GFR, Estimated: 29 mL/min — ABNORMAL LOW (ref >60)
Glucose, Bld: 158 mg/dL — ABNORMAL HIGH (ref 70–99)
Potassium: 3.7 mmol/L (ref 3.5–5.1)
Sodium: 137 mmol/L (ref 135–145)
Total Bilirubin: 0.7 mg/dL (ref 0.3–1.2)
Total Protein: 7.5 g/dL (ref 6.5–8.1)

## 2022-09-21 LAB — TROPONIN I (HIGH SENSITIVITY)
Troponin I (High Sensitivity): 18 ng/L — ABNORMAL HIGH (ref <18)
Troponin I (High Sensitivity): 14 ng/L (ref <18)

## 2022-09-21 LAB — LACTIC ACID, PLASMA
Lactic Acid, Venous: 2.5 mmol/L (ref 0.5–1.9)
Lactic Acid, Venous: 1.8 mmol/L (ref 0.5–1.9)

## 2022-09-21 LAB — BRAIN NATRIURETIC PEPTIDE: B Natriuretic Peptide: 307.8 pg/mL — ABNORMAL HIGH (ref 0.0–100.0)

## 2022-09-21 LAB — SARS CORONAVIRUS 2 BY RT PCR: SARS Coronavirus 2 by RT PCR: NEGATIVE

## 2022-09-21 MED ORDER — SODIUM CHLORIDE 0.9 % IV SOLN
500.0000 mg | INTRAVENOUS | Status: DC
  Administered 2022-09-22: 500 mg via INTRAVENOUS
  Filled 2022-09-21 (×2): qty 5

## 2022-09-21 MED ORDER — ENOXAPARIN SODIUM 60 MG/0.6ML IJ SOSY: 55.0000 mg | PREFILLED_SYRINGE | INTRAMUSCULAR | Status: DC

## 2022-09-21 MED ORDER — ONDANSETRON HCL 4 MG PO TABS: 4.0000 mg | ORAL_TABLET | Freq: Four times a day (QID) | ORAL | Status: DC | PRN

## 2022-09-21 MED ORDER — EDOXABAN TOSYLATE 30 MG PO TABS
30.0000 mg | ORAL_TABLET | ORAL | Status: DC
  Administered 2022-09-22 – 2022-09-24 (×3): 30 mg via ORAL
  Filled 2022-09-21 (×4): qty 1

## 2022-09-21 MED ORDER — SODIUM CHLORIDE 0.9 % IV SOLN
500.0000 mg | INTRAVENOUS | Status: DC
  Administered 2022-09-21: 500 mg via INTRAVENOUS
  Filled 2022-09-21: qty 5

## 2022-09-21 MED ORDER — PANTOPRAZOLE SODIUM 40 MG PO TBEC
40.0000 mg | DELAYED_RELEASE_TABLET | Freq: Every day | ORAL | Status: DC
  Administered 2022-09-22 – 2022-09-24 (×3): 40 mg via ORAL
  Filled 2022-09-21 (×3): qty 1

## 2022-09-21 MED ORDER — SODIUM CHLORIDE 0.9 % IV SOLN
2.0000 g | INTRAVENOUS | Status: DC
  Administered 2022-09-21: 2 g via INTRAVENOUS
  Filled 2022-09-21: qty 20

## 2022-09-21 MED ORDER — FUROSEMIDE 40 MG PO TABS: 20.0000 mg | ORAL_TABLET | Freq: Every day | ORAL | Status: DC

## 2022-09-21 MED ORDER — SODIUM CHLORIDE 0.9 % IV SOLN: 2.0000 g | INTRAVENOUS | Status: DC

## 2022-09-21 MED ORDER — ATORVASTATIN CALCIUM 10 MG PO TABS
10.0000 mg | ORAL_TABLET | Freq: Every day | ORAL | Status: DC
  Administered 2022-09-22 – 2022-09-24 (×3): 10 mg via ORAL
  Filled 2022-09-21 (×3): qty 1

## 2022-09-21 MED ORDER — ALBUTEROL SULFATE (2.5 MG/3ML) 0.083% IN NEBU: 2.5000 mg | INHALATION_SOLUTION | RESPIRATORY_TRACT | Status: DC | PRN

## 2022-09-21 MED ORDER — SODIUM CHLORIDE 0.9 % IV SOLN
2.0000 g | INTRAVENOUS | Status: DC
  Administered 2022-09-22 – 2022-09-23 (×2): 2 g via INTRAVENOUS
  Filled 2022-09-21 (×3): qty 20

## 2022-09-21 MED ORDER — METHYLPREDNISOLONE SODIUM SUCC 125 MG IJ SOLR
125.0000 mg | Freq: Two times a day (BID) | INTRAMUSCULAR | Status: AC
  Administered 2022-09-21 – 2022-09-22 (×2): 125 mg via INTRAVENOUS
  Filled 2022-09-21 (×2): qty 2

## 2022-09-21 MED ORDER — TIOTROPIUM BROMIDE MONOHYDRATE 2.5 MCG/ACT IN AERS: 2.0000 | INHALATION_SPRAY | Freq: Every day | RESPIRATORY_TRACT | Status: DC

## 2022-09-21 MED ORDER — SODIUM CHLORIDE 0.9 % IV SOLN: INTRAVENOUS | Status: DC

## 2022-09-21 MED ORDER — ENZALUTAMIDE 40 MG PO TABS: 160.0000 mg | ORAL_TABLET | Freq: Every day | ORAL | Status: DC

## 2022-09-21 MED ORDER — SODIUM CHLORIDE 0.9 % IV SOLN: 500.0000 mg | INTRAVENOUS | Status: DC

## 2022-09-21 MED ORDER — PREDNISONE 20 MG PO TABS
40.0000 mg | ORAL_TABLET | Freq: Every day | ORAL | Status: DC
  Administered 2022-09-22 – 2022-09-24 (×3): 40 mg via ORAL
  Filled 2022-09-21 (×3): qty 2

## 2022-09-21 MED ORDER — DAPAGLIFLOZIN PROPANEDIOL 10 MG PO TABS
10.0000 mg | ORAL_TABLET | Freq: Every day | ORAL | Status: DC
  Filled 2022-09-21: qty 1

## 2022-09-21 MED ORDER — METOPROLOL TARTRATE 25 MG PO TABS
37.5000 mg | ORAL_TABLET | Freq: Two times a day (BID) | ORAL | Status: DC
  Administered 2022-09-21 – 2022-09-24 (×6): 37.5 mg via ORAL
  Filled 2022-09-21 (×6): qty 2

## 2022-09-21 MED ORDER — ONDANSETRON HCL 4 MG/2ML IJ SOLN: 4.0000 mg | Freq: Four times a day (QID) | INTRAMUSCULAR | Status: DC | PRN

## 2022-09-21 MED ORDER — IPRATROPIUM-ALBUTEROL 0.5-2.5 (3) MG/3ML IN SOLN
3.0000 mL | Freq: Four times a day (QID) | RESPIRATORY_TRACT | Status: DC
  Administered 2022-09-21 – 2022-09-23 (×8): 3 mL via RESPIRATORY_TRACT
  Filled 2022-09-21 (×8): qty 3

## 2022-09-21 NOTE — ED Triage Notes (Signed)
pt in via [facility] from home for sudden onset of SOB & wheezing. Pt placed on Cpap with EMS. Hx of asthma. 84% on RA upon arrival. Pt seen here for cellulitis on 8/18. Currently being treated cellulitis.   Per EMS Meds Given: 125 of solumedrol Albuterol x1 X2 duo nebs Mag 2g

## 2022-09-21 NOTE — ED Notes (Signed)
Per MD hold for second lactic at this time.

## 2022-09-21 NOTE — Assessment & Plan Note (Signed)
Baseline creatinine 1.5-2.1 Creatinine 2.15 today GFR in the upper 20s Monitor renal function Minimize nephrotoxic agents

## 2022-09-21 NOTE — Assessment & Plan Note (Signed)
Decompensated respiratory failure now requiring BiPAP with concern for COPD exacerbation?  Pneumonia Chest x-ray with mild bibasilar atelectatic disease Appears fairly euvolemic to mildly volume overloaded in the setting of CHF IV Solu-Medrol IV Rocephin azithromycin for infectious coverage As needed DuoNebs Continue supplemental oxygen Gentle diuresis Follow closely

## 2022-09-21 NOTE — Progress Notes (Signed)
PHARMACIST - PHYSICIAN COMMUNICATION  CONCERNING:  Enoxaparin (Lovenox) for DVT Prophylaxis    RECOMMENDATION: Patient was prescribed enoxaprin 40mg  q24 hours for VTE prophylaxis.   Filed Weights   09/21/22 1244  Weight: 109.7 kg (241 lb 13.5 oz)    Body mass index is 32.8 kg/m.  Estimated Creatinine Clearance: 31 mL/min (A) (by C-G formula based on SCr of 2.15 mg/dL (H)).   Based on [facility] policy patient is candidate for enoxaparin 0.5mg /kg TBW SQ every 24 hours based on BMI being >30.   DESCRIPTION: Pharmacy has adjusted enoxaparin dose per [facility] policy.  Patient is now receiving enoxaparin 55 mg every 24 hours    Barrie Folk, PharmD Clinical Pharmacist  09/21/2022 4:11 PM

## 2022-09-21 NOTE — ED Provider Notes (Signed)
[facility] Provider Note    Event Date/Time   First MD Initiated Contact with Patient 09/21/22 1233     (approximate)   History   Respiratory Distress   HPI  [name] is a 89 y.o. male who arrives to the ER in moderate acute respiratory distress.  He arrives from home via EMS on CPAP.  Reportedly got up to do something at home felt sudden onset shortness of breath.  Prior department found the patient in the low 80s tripoding and tachypneic.  Was placed on nonrebreather.  EMS placed on CPAP as well as gave Solu-Medrol DuoNebs and mag.  Have noted some improvement but still with significant respiratory distress.     Physical Exam   Triage Vital Signs: ED Triage Vitals [09/21/22 1234]  Encounter Vitals Group     BP      Systolic BP Percentile      Diastolic BP Percentile      Pulse      Resp      Temp      Temp src      SpO2 100 %     Weight      Height      Head Circumference      Peak Flow      Pain Score      Pain Loc      Pain Education      Exclude from Growth Chart     Most recent vital signs: Vitals:   09/21/22 1234 09/21/22 1243  BP:  110/77  Pulse:  82  Resp:  12  Temp:  97.8 F (36.6 C)  SpO2: 100% 100%     Constitutional: Critically ill-appearing in respiratory distress Eyes: Conjunctivae are normal.  Head: Atraumatic. Nose: No congestion/rhinnorhea. Mouth/Throat: Mucous membranes are moist.   Neck: Painless ROM.  Cardiovascular:   Tachycardic Respiratory: Significant tachypnea occasional wheeze. Gastrointestinal: Soft and nontender.  Musculoskeletal:  no deformity, bilateral lower extremity edema. Neurologic:  MAE spontaneously. No gross focal neurologic deficits are appreciated.  Skin:  Skin is warm, dry and intact. No rash noted. Psychiatric: Mood and affect are normal. Speech and behavior are normal.    ED Results / Procedures / Treatments   Labs (all labs ordered are listed, but only abnormal  results are displayed) Labs Reviewed  COMPREHENSIVE METABOLIC PANEL - Abnormal; Notable for the following components:      Result Value   Glucose, Bld 158 (*)    BUN 33 (*)    Creatinine, Ser 2.15 (*)    Albumin 3.0 (*)    GFR, Estimated 29 (*)    All other components within normal limits  BRAIN NATRIURETIC PEPTIDE - Abnormal; Notable for the following components:   B Natriuretic Peptide 307.8 (*)    All other components within normal limits  BLOOD GAS, VENOUS - Abnormal; Notable for the following components:   pO2, Ven <31 (*)    All other components within normal limits  TROPONIN I (HIGH SENSITIVITY) - Abnormal; Notable for the following components:   Troponin I (High Sensitivity) 18 (*)    All other components within normal limits  SARS CORONAVIRUS 2 BY RT PCR  CULTURE, BLOOD (ROUTINE X 2)  CULTURE, BLOOD (ROUTINE X 2)  CBC WITH DIFFERENTIAL/PLATELET  LACTIC ACID, PLASMA  LACTIC ACID, PLASMA     EKG  ED ECG REPORT I, Willy Eddy, the attending physician, personally viewed and interpreted this ECG.   Date: 09/21/2022  EKG Time: 12:39  Rate: 85  Rhythm: paced rhythm  Axis: normal  ST&T Change: no stemi    RADIOLOGY Please see ED Course for my review and interpretation.  I personally reviewed all radiographic images ordered to evaluate for the above acute complaints and reviewed radiology reports and findings.  These findings were personally discussed with the patient.  Please see medical record for radiology report.    PROCEDURES:  Critical Care performed: Yes, see critical care procedure note(s)  .Critical Care  Performed by: Willy Eddy, MD Authorized by: Willy Eddy, MD   Critical care provider statement:    Critical care time (minutes):  35   Critical care was necessary to treat or prevent imminent or life-threatening deterioration of the following conditions:  Respiratory failure   Critical care was time spent personally by me on the  following activities:  Ordering and performing treatments and interventions, ordering and review of laboratory studies, ordering and review of radiographic studies, pulse oximetry, re-evaluation of patient's condition, review of old charts, obtaining history from patient or surrogate, examination of patient, evaluation of patient's response to treatment, discussions with primary provider, discussions with consultants and development of treatment plan with patient or surrogate    MEDICATIONS ORDERED IN ED: Medications  cefTRIAXone (ROCEPHIN) 2 g in sodium chloride 0.9 % 100 mL IVPB (has no administration in time range)  azithromycin (ZITHROMAX) 500 mg in sodium chloride 0.9 % 250 mL IVPB (has no administration in time range)     IMPRESSION / MDM / ASSESSMENT AND PLAN / ED COURSE  I reviewed the triage vital signs and the nursing notes.                              Differential diagnosis includes, but is not limited to, Asthma, copd, CHF, pna, ptx, malignancy, Pe, anemia  Patient presenting to the ER for evaluation of symptoms as described above.  Based on symptoms, risk factors and considered above differential, this presenting complaint could reflect a potentially life-threatening illness therefore the patient will be placed on continuous pulse oximetry and telemetry for monitoring.  Laboratory evaluation will be sent to evaluate for the above complaints.      Clinical Course as of 09/21/22 1353  Sat Sep 21, 2022  1257 VBG without hypercapnic respiratory failure.  Chest x-ray without effusion or pneumothorax on my review and interpretation. [PR]  1337 Patient reassessed.  Looks significantly improved breathing comfortably currently on BiPAP.  At this point does appear stable and appropriate for admission to hospitalist service.  Will consult hospitalist. [PR]    Clinical Course User Index [PR] Willy Eddy, MD     FINAL CLINICAL IMPRESSION(S) / ED DIAGNOSES   Final diagnoses:   Acute respiratory failure with hypoxia (HCC)     Rx / DC Orders   ED Discharge Orders     None        Note:  This document was prepared using Dragon voice recognition software and may include unintentional dictation errors.    Willy Eddy, MD 09/21/22 [phone]

## 2022-09-21 NOTE — ED Notes (Signed)
RT called for Duo neb treatment.

## 2022-09-21 NOTE — Assessment & Plan Note (Signed)
SSI  A1C  

## 2022-09-21 NOTE — Progress Notes (Signed)
       CROSS COVER NOTE  NAME: [name] MRN: [id number] DOB : 02-25-1933    Concern as stated by nurse / staff   the patient is being admitted for respiratory distress - currently on bipap. He is having this feeling on impending doom. He states he is not going to live until tomorrow. I just wanted to give you a heads up.. just in case you wanted to come see him or order something to relieve some of the anxiety.      Pertinent findings on chart review: H&P reviewed.  Extensive past medical history DVT, Atrial fibrillation on AC, CHF, CKDIIIb-IV , DMII, Hypertension, OSA, prosate ca, CVA , DMII hospitalized for the second time in a week. 8/18 to 8/26: Cellulitis right forearm complicated by NSVT ruled out for PE.  As well as AKI Readmitted 8/31: COPD with respiratory failure on BiPAP, possible mild fluid overload (BNP 308, troponin 18-->14) AKI on CKD, improving cellulitis     09/21/2022    7:15 PM 09/21/2022    7:00 PM 09/21/2022    5:00 PM  Vitals with BMI  Systolic 136 130 782  Diastolic 84 81 80  Pulse 106 88 61  ABG    Component Value Date/Time   HCO3 27.3 09/21/2022 1237   O2SAT 38.1 09/21/2022 1237   Chest x-ray showing basilar atelectasis   Assessment and  Interventions   Assessment:  Acute respiratory failure secondary to COPD requiring BiPAP Low suspicion for CHF given low BNP and chest x-ray Low suspicion for PE given chronic anticoagulation (negative V/Q 8/23 for similar symptoms)  Plan: Continue BiPAP Trial of low-dose of Xanax for anxiety X

## 2022-09-21 NOTE — ED Notes (Signed)
Tiffany would like to be notified when the pt gets a room on the floor. She is POA. Number is in the chart.

## 2022-09-21 NOTE — Assessment & Plan Note (Signed)
Decompensated COPD now requiring BiPAP IV Solu-Medrol DuoNebs IV Rocephin azithromycin for infectious/possible pneumonia coverage Continue supplemental oxygen Follow

## 2022-09-21 NOTE — Assessment & Plan Note (Addendum)
Improved Admitted August 18 through August 26 Extensive workup including right upper extremity u/s negative for DVT Negative blood cultures On Augmentin through 9/2 Hold for now in setting of Rocephin and azithromycin for upper resp coverage Follow

## 2022-09-21 NOTE — ED Notes (Signed)
Second lactic drawn/sent.

## 2022-09-21 NOTE — Assessment & Plan Note (Signed)
Continue anticoagulation and metoprolol

## 2022-09-21 NOTE — ED Notes (Signed)
Attempting to get blood cultures before abx. Pt has poor vasculature and R arm is swollen.

## 2022-09-21 NOTE — Assessment & Plan Note (Signed)
2D ECHO 06/2022 EF 55-60%  Appears euvolemic vs. Mildly volume overloaded  Cont home regimen  Strict Is and Os and daily weights

## 2022-09-21 NOTE — H&P (Addendum)
History and Physical    Patient: [name] [id number] DOB: 07/10/1933 DOA: 09/21/2022 DOS: the patient was seen and examined on 09/21/2022 PCP: [name], NP  Patient coming from: Home  Chief Complaint:  Chief Complaint  Patient presents with   Respiratory Distress   HPI: [name] is a 89 y.o. male with medical history significant of DVT, Atrial fibrillation , CHF, CKDIIIb-IV , DMII, Hypertension, OSA, prosate ca, CVA , DMII , Hyperlipidemia, COPD presenting with acute respiratory failure hypoxia.  Patient reports significant decrease in respiratory status over the past 12 to 24 hours.  Noted admission August 18 through August 26 for multiple issues including right extremity cellulitis, NSVT, acute on chronic stage III CKD.  Patient reports overall resolution of right extremity cellulitis.  No fevers or chills.  Minimal chest pain positive cough wheezing increased work of breathing.  Has been compliant with home inhalers.  No reported tobacco use.  No reported orthopnea PND.  Has been compliant with diuretic regimen.  No reported high salt or NSAID intake.  Right upper extremity cellulitis improving with oral Augmentin. Presented to the ER afebrile, hemodynamically stable.  Requiring BiPAP.  White count 7.4, hemoglobin 13.2, platelets 356, creatinine 2.15, glucose 158.  Troponin 18.  Lactate 2.5.  Chest x-ray with mild bibasilar atelectasis/airspace disease. Review of Systems: As mentioned in the history of present illness. All other systems reviewed and are negative. Past Medical History:  Diagnosis Date   Acute deep vein thrombosis (DVT) of femoral vein of left lower extremity (HCC)    Atrial fibrillation (HCC)    CHF (congestive heart failure) (HCC)    Chronic kidney disease    Diabetes (HCC)    Hearing loss    High blood pressure    History of bladder problems    Hyperlipidemia    Kidney stones 11/22/2014   Liver cyst    OSA (obstructive sleep apnea)     Prostate cancer (HCC)    Prostate cancer (HCC)    Stroke (HCC)    Valvular heart disease    Past Surgical History:  Procedure Laterality Date   CATARACT EXTRACTION     COLONOSCOPY WITH PROPOFOL N/A 07/02/2019   Procedure: COLONOSCOPY WITH PROPOFOL;  Surgeon: Toney Reil, MD;  Location: [facility] ENDOSCOPY;  Service: Gastroenterology;  Laterality: N/A;   ESOPHAGOGASTRODUODENOSCOPY (EGD) WITH PROPOFOL N/A 03/19/2022   Procedure: ESOPHAGOGASTRODUODENOSCOPY (EGD) WITH PROPOFOL;  Surgeon: Wyline Mood, MD;  Location: [facility] ENDOSCOPY;  Service: Gastroenterology;  Laterality: N/A;   IR CT HEAD LTD  08/27/2019   IR PERCUTANEOUS ART THROMBECTOMY/INFUSION INTRACRANIAL INC DIAG ANGIO  08/27/2019       IR PERCUTANEOUS ART THROMBECTOMY/INFUSION INTRACRANIAL INC DIAG ANGIO  08/27/2019   LOWER EXTREMITY ANGIOGRAPHY Left 12/25/2020   Procedure: Lower Extremity Angiography;  Surgeon: Annice Needy, MD;  Location: [facility] INVASIVE CV LAB;  Service: Cardiovascular;  Laterality: Left;   PACEMAKER LEADLESS INSERTION N/A 12/19/2020   Procedure: PACEMAKER LEADLESS INSERTION;  Surgeon: Marcina Millard, MD;  Location: [facility] INVASIVE CV LAB;  Service: Cardiovascular;  Laterality: N/A;   PROSTATE CANCER     PROSTATE SURGERY     RADIOLOGY WITH ANESTHESIA N/A 08/27/2019   Procedure: IR WITH ANESTHESIA;  Surgeon: Julieanne Cotton, MD;  Location: [facility] OR;  Service: Radiology;  Laterality: N/A;   Social History:  reports that he has quit smoking. His smoking use included cigarettes. He has never used smokeless tobacco. He reports that he does not drink alcohol and does not use drugs.  No Known Allergies  Family History  Problem Relation Age of Onset   Colon cancer Mother    Diabetes Other    High blood pressure Other    Prostate cancer Brother    Diabetes Brother     Prior to Admission medications   Medication Sig Start Date End Date Taking? Authorizing Provider  albuterol (VENTOLIN HFA) 108 (90 Base) MCG/ACT inhaler  Inhale 2 puffs into the lungs every 4 (four) hours as needed for wheezing or shortness of breath. 03/16/21  Yes Abernathy, Arlyss Repress, NP  amoxicillin-clavulanate (AUGMENTIN) 875-125 MG tablet Take 1 tablet by mouth 2 (two) times daily for 7 days. 09/16/22 09/23/22 Yes Marrion Coy, MD  atorvastatin (LIPITOR) 10 MG tablet TAKE 1 TABLET(10 MG) BY MOUTH DAILY 08/02/22  Yes Abernathy, Alyssa, NP  dapagliflozin propanediol (FARXIGA) 10 MG TABS tablet Take 1 tablet (10 mg total) by mouth daily. 08/02/22  Yes Abernathy, Arlyss Repress, NP  edoxaban (SAVAYSA) 30 MG TABS tablet Take 30 mg by mouth daily. 11/01/21  Yes [provider]  enzalutamide (XTANDI) 40 MG tablet Take 4 tablets (160 mg total) by mouth daily. 07/17/22  Yes [name], MD  furosemide (LASIX) 20 MG tablet Take 1 tablet (20 mg total) by mouth daily. 07/03/22 10/01/22 Yes Agbor-Etang, Arlys John, MD  glipiZIDE (GLUCOTROL XL) 2.5 MG 24 hr tablet Take 1 tablet (2.5 mg total) by mouth daily with breakfast. 08/29/22  Yes Abernathy, Alyssa, NP  metoprolol tartrate 37.5 MG TABS Take 37.5 mg by mouth 2 (two) times daily. 10/06/21  Yes Wieting, Richard, MD  pantoprazole (PROTONIX) 40 MG tablet Take 1 tablet (40 mg total) by mouth daily. 06/27/22 06/27/23 Yes Minna Antis, MD  polyethylene glycol (MIRALAX / GLYCOLAX) 17 g packet Take 17 g by mouth 2 (two) times daily. Skip the dose if no constipation 07/24/22 10/22/22 Yes Gillis Santa, MD  SPIRIVA RESPIMAT 2.5 MCG/ACT AERS INHALE 2 PUFFS INTO THE LUNGS DAILY 06/19/22  Yes Abernathy, Arlyss Repress, NP  vitamin B-12 (CYANOCOBALAMIN) 1000 MCG tablet Take 1 tablet (1,000 mcg total) by mouth daily. 12/08/20  Yes [name], NP    Physical Exam: Vitals:   09/21/22 1234 09/21/22 1243 09/21/22 1244  BP:  110/77   Pulse:  82   Resp:  12   Temp:  97.8 F (36.6 C)   TempSrc:  Axillary   SpO2: 100% 100%   Weight:   109.7 kg  Height:   6' (1.829 m)   Physical Exam Constitutional:      Appearance: He is obese.      Comments: Mild increased WOB BIPAP in place   HENT:     Head: Normocephalic and atraumatic.     Nose: Nose normal.     Mouth/Throat:     Mouth: Mucous membranes are moist.  Cardiovascular:     Rate and Rhythm: Normal rate and regular rhythm.  Pulmonary:     Comments: Positive mild increased work of breathing Positive wheezing Abdominal:     General: Abdomen is flat.  Musculoskeletal:        General: Normal range of motion.     Cervical back: Normal range of motion.  Skin:    General: Skin is warm.  Neurological:     General: No focal deficit present.  Psychiatric:        Mood and Affect: Mood normal.     Data Reviewed:  There are no new results to review at this time. DG Chest Portable 1 View CLINICAL DATA:  Respiratory distress.  EXAM: PORTABLE CHEST 1 VIEW  COMPARISON:  Chest radiograph dated 09/13/2022.  FINDINGS: The heart size is mildly enlarged. There is mild bibasilar atelectasis/airspace disease. There is no significant pleural effusion or pneumothorax. Degenerative changes are seen in the spine.  IMPRESSION: Mild bibasilar atelectasis/airspace disease.  Electronically Signed   By: Romona Curls M.D.   On: 09/21/2022 13:22  Lab Results  Component Value Date   WBC 7.4 09/21/2022   HGB 13.2 09/21/2022   HCT 40.7 09/21/2022   MCV 95.3 09/21/2022   PLT 356 09/21/2022   Last metabolic panel Lab Results  Component Value Date   GLUCOSE 158 (H) 09/21/2022   NA 137 09/21/2022   K 3.7 09/21/2022   CL 100 09/21/2022   CO2 23 09/21/2022   BUN 33 (H) 09/21/2022   CREATININE 2.15 (H) 09/21/2022   GFRNONAA 29 (L) 09/21/2022   CALCIUM 9.0 09/21/2022   PHOS 3.6 07/01/2019   PROT 7.5 09/21/2022   ALBUMIN 3.0 (L) 09/21/2022   LABGLOB 2.9 06/11/2019   AGRATIO 1.5 06/11/2019   BILITOT 0.7 09/21/2022   ALKPHOS 105 09/21/2022   AST 22 09/21/2022   ALT 13 09/21/2022   ANIONGAP 14 09/21/2022    Assessment and Plan: * Acute respiratory failure with  hypoxia (HCC) Decompensated respiratory failure now requiring BiPAP with concern for COPD exacerbation?  Pneumonia Chest x-ray with mild bibasilar atelectatic disease Appears fairly euvolemic to mildly volume overloaded in the setting of CHF IV Solu-Medrol IV Rocephin azithromycin for infectious coverage As needed DuoNebs Continue supplemental oxygen Gentle diuresis Follow closely    COPD (chronic obstructive pulmonary disease) (HCC) Decompensated COPD now requiring BiPAP IV Solu-Medrol DuoNebs IV Rocephin azithromycin for infectious/possible pneumonia coverage Continue supplemental oxygen Follow  Chronic kidney disease, stage 3b (HCC) Baseline creatinine 1.5-2.1 Creatinine 2.15 today GFR in the upper 20s Monitor renal function Minimize nephrotoxic agents  Chronic a-fib (HCC) Stable  Cont home regimen including metoprolol and edoxaban   (HFpEF) heart failure with preserved ejection fraction (HCC) 2D ECHO 06/2022 EF 55-60%  Appears euvolemic vs. Mildly volume overloaded  Cont home regimen  Strict Is and Os and daily weights    Cellulitis of right upper extremity Improved Admitted August 18 through August 26 Extensive workup including right upper extremity u/s negative for DVT Negative blood cultures On Augmentin through 9/2 Hold for now in setting of Rocephin and azithromycin for upper resp coverage Follow    Stroke (HCC) Continue anticoagulation and metoprolol   Type II diabetes mellitus with renal manifestations (HCC) SSI  A1C   Essential hypertension BP stable  Titrate home regimen        Advance Care Planning:   Code Status: Full Code   Consults: None   Family Communication: No family at the bedside   Severity of Illness: The appropriate patient status for this patient is INPATIENT. Inpatient status is judged to be reasonable and necessary in order to provide the required intensity of service to ensure the patient's safety. The patient's  presenting symptoms, physical exam findings, and initial radiographic and laboratory data in the context of their chronic comorbidities is felt to place them at high risk for further clinical deterioration. Furthermore, it is not anticipated that the patient will be medically stable for discharge from the hospital within 2 midnights of admission.   * I certify that at the point of admission it is my clinical judgment that the patient will require inpatient hospital care spanning beyond 2 midnights from the point of admission  due to high intensity of service, high risk for further deterioration and high frequency of surveillance required.*  Author: [name], MD 09/21/2022 4:32 PM  For on call review [email or web address].

## 2022-09-21 NOTE — Assessment & Plan Note (Signed)
Stable  Cont home regimen including metoprolol and edoxaban

## 2022-09-21 NOTE — Assessment & Plan Note (Signed)
BP stable Titrate home regimen 

## 2022-09-22 ENCOUNTER — Encounter: Payer: Self-pay | Admitting: Family Medicine

## 2022-09-22 DIAGNOSIS — J441 Chronic obstructive pulmonary disease with (acute) exacerbation: Secondary | ICD-10-CM | POA: Diagnosis not present

## 2022-09-22 DIAGNOSIS — J9601 Acute respiratory failure with hypoxia: Secondary | ICD-10-CM | POA: Diagnosis not present

## 2022-09-22 LAB — COMPREHENSIVE METABOLIC PANEL
ALT: 13 U/L (ref 0–44)
AST: 17 U/L (ref 15–41)
Albumin: 3.2 g/dL — ABNORMAL LOW (ref 3.5–5.0)
Alkaline Phosphatase: 102 U/L (ref 38–126)
Anion gap: 14 (ref 5–15)
BUN: 29 mg/dL — ABNORMAL HIGH (ref 8–23)
CO2: 23 mmol/L (ref 22–32)
Calcium: 9.3 mg/dL (ref 8.9–10.3)
Chloride: 102 mmol/L (ref 98–111)
Creatinine, Ser: 1.77 mg/dL — ABNORMAL HIGH (ref 0.61–1.24)
GFR, Estimated: 37 mL/min — ABNORMAL LOW (ref >60)
Glucose, Bld: 157 mg/dL — ABNORMAL HIGH (ref 70–99)
Potassium: 4.5 mmol/L (ref 3.5–5.1)
Sodium: 139 mmol/L (ref 135–145)
Total Bilirubin: 0.6 mg/dL (ref 0.3–1.2)
Total Protein: 7.9 g/dL (ref 6.5–8.1)

## 2022-09-22 LAB — CBC
HCT: 43 % (ref 39.0–52.0)
Hemoglobin: 13.9 g/dL (ref 13.0–17.0)
MCH: 31.2 pg (ref 26.0–34.0)
MCHC: 32.3 g/dL (ref 30.0–36.0)
MCV: 96.6 fL (ref 80.0–100.0)
Platelets: 322 10*3/uL (ref 150–400)
RBC: 4.45 MIL/uL (ref 4.22–5.81)
RDW: 14.1 % (ref 11.5–15.5)
WBC: 6.4 10*3/uL (ref 4.0–10.5)
nRBC: 0 % (ref 0.0–0.2)

## 2022-09-22 MED ORDER — ENZALUTAMIDE 40 MG PO TABS
160.0000 mg | ORAL_TABLET | Freq: Every day | ORAL | Status: DC
  Administered 2022-09-22 – 2022-09-23 (×2): 160 mg via ORAL
  Filled 2022-09-22 (×2): qty 4

## 2022-09-22 MED ORDER — FUROSEMIDE 20 MG PO TABS
20.0000 mg | ORAL_TABLET | Freq: Every day | ORAL | Status: DC
  Administered 2022-09-22 – 2022-09-24 (×3): 20 mg via ORAL
  Filled 2022-09-22 (×3): qty 1

## 2022-09-22 NOTE — Plan of Care (Signed)

## 2022-09-22 NOTE — ED Notes (Signed)
Pt. Cleaned and dry brief applied. Bed changed. Soiled clothing placed in belongings bag. Pt. Placed bacl on monitor.

## 2022-09-22 NOTE — ED Notes (Signed)
Pt removed from Bipap and placed on 4 liters N/c

## 2022-09-22 NOTE — Progress Notes (Signed)
Progress Note    [name]  UEA:[id number] DOB: Aug 04, 1933  DOA: 09/21/2022 PCP: [name], NP      Brief Narrative:    Medical records reviewed and are as summarized below:  [name] is a 89 y.o. male medical history significant of DVT, Atrial fibrillation , CHF, CKDIIIb-IV , DMII, Hypertension, OSA, prosate ca, CVA , DMII , Hyperlipidemia, COPD, recent hospitalization for right upper extremity cellulitis from 09/08/2022 through 09/16/2022.  He presented to the hospital because of shortness of breath and wheezing.  Reportedly, oxygen saturation was 84% on room air when EMS arrived.    He was admitted to the hospital for COPD exacerbation and acute hypoxic respiratory failure.   Assessment/Plan:   Principal Problem:   Acute respiratory failure with hypoxia (HCC) Active Problems:   COPD (chronic obstructive pulmonary disease) (HCC)   Chronic a-fib (HCC)   Chronic kidney disease, stage 3b (HCC)   Essential hypertension   Type II diabetes mellitus with renal manifestations (HCC)   Stroke (HCC)   Cellulitis of right upper extremity   (HFpEF) heart failure with preserved ejection fraction (HCC)    Body mass index is 32.8 kg/m.  (Obesity)   COPD exacerbation: Continue IV steroids, bronchodilators and empiric antibiotics   Acute hypoxic respiratory failure: Improving.  He is off of BiPAP.  He is on 4 L/min oxygen via [place].   Paroxysmal atrial fibrillation: Continue edoxaban and metoprolol.   CKD stage IIIb/CKD stage IV: He has had fluctuating creatinine for some time now exact stage is unknown at this time.   Chronic diastolic CHF: Continue Lasix   Other comorbidities include type II DM, hypertension, history of stroke, recent right upper extremity cellulitis      Diet Order     None                Consultants: None  Procedures: None    Medications:    atorvastatin  10 mg Oral Daily   edoxaban  30 mg Oral Q24H    enzalutamide  160 mg Oral Daily   ipratropium-albuterol  3 mL Nebulization Q6H   metoprolol tartrate  37.5 mg Oral BID   pantoprazole  40 mg Oral Daily   predniSONE  40 mg Oral Q breakfast   Continuous Infusions:  azithromycin     cefTRIAXone (ROCEPHIN)  IV       Anti-infectives (From admission, onward)    Start     Dose/Rate Route Frequency Ordered Stop   09/22/22 1400  azithromycin (ZITHROMAX) 500 mg in sodium chloride 0.9 % 250 mL IVPB        500 mg 250 mL/hr over 60 Minutes Intravenous Every 24 hours 09/21/22 1609 09/26/22 1359   09/22/22 1200  cefTRIAXone (ROCEPHIN) 2 g in sodium chloride 0.9 % 100 mL IVPB        2 g 200 mL/hr over 30 Minutes Intravenous Every 24 hours 09/21/22 1609 09/27/22 1159   09/21/22 1615  cefTRIAXone (ROCEPHIN) 2 g in sodium chloride 0.9 % 100 mL IVPB  Status:  Discontinued        2 g 200 mL/hr over 30 Minutes Intravenous Every 24 hours 09/21/22 1609 09/21/22 1609   09/21/22 1615  azithromycin (ZITHROMAX) 500 mg in sodium chloride 0.9 % 250 mL IVPB  Status:  Discontinued        500 mg 250 mL/hr over 60 Minutes Intravenous Every 24 hours 09/21/22 1609 09/21/22 1609   09/21/22 1345  cefTRIAXone (  ROCEPHIN) 2 g in sodium chloride 0.9 % 100 mL IVPB  Status:  Discontinued        2 g 200 mL/hr over 30 Minutes Intravenous Every 24 hours 09/21/22 1333 09/21/22 1609   09/21/22 1345  azithromycin (ZITHROMAX) 500 mg in sodium chloride 0.9 % 250 mL IVPB  Status:  Discontinued        500 mg 250 mL/hr over 60 Minutes Intravenous Every 24 hours 09/21/22 1333 09/21/22 1609              Family Communication/Anticipated D/C date and plan/Code Status   DVT prophylaxis: SCDs Start: 09/21/22 1600 edoxaban (SAVAYSA) tablet 30 mg     Code Status: Full Code  Family Communication: Plan of care was discussed with Tiffany, HPOA, over the phone Disposition Plan: Plan to discharge home in 2 to 3 days   Status is: Inpatient Remains inpatient appropriate  because: COPD exacerbation       Subjective:   Interval events noted.  No complaints.  Breathing is better. His nurse was at the bedside.  Objective:    Vitals:   09/22/22 0530 09/22/22 0600 09/22/22 0630 09/22/22 0700  BP: 130/74 (!) 156/83 134/85 (!) 142/89  Pulse: 76  69 73  Resp: 17 16 16 19   Temp:      TempSrc:      SpO2: 100%  100% 100%  Weight:      Height:       No data found.   Intake/Output Summary (Last 24 hours) at 09/22/2022 0902 Last data filed at 09/21/2022 1455 Gross per 24 hour  Intake 100 ml  Output 200 ml  Net -100 ml   Filed Weights   09/21/22 1244  Weight: 109.7 kg    Exam:  GEN: NAD SKIN: Warm and dry EYES: No pallor or icterus ENT: MMM CV: RRR PULM: Decreased air entry bilaterally, bilateral rhonchi at the bases, no wheezing  ABD: soft, ND, NT, +BS CNS: AAO x 3, non focal EXT: No edema or tenderness        Data Reviewed:   I have personally reviewed following labs and imaging studies:  Labs: Labs show the following:   Basic Metabolic Panel: Recent Labs  Lab 09/21/22 1239  NA 137  K 3.7  CL 100  CO2 23  GLUCOSE 158*  BUN 33*  CREATININE 2.15*  CALCIUM 9.0   GFR Estimated Creatinine Clearance: 31 mL/min (A) (by C-G formula based on SCr of 2.15 mg/dL (H)). Liver Function Tests: Recent Labs  Lab 09/21/22 1239  AST 22  ALT 13  ALKPHOS 105  BILITOT 0.7  PROT 7.5  ALBUMIN 3.0*   No results for input(s): "LIPASE", "AMYLASE" in the last 168 hours. No results for input(s): "AMMONIA" in the last 168 hours. Coagulation profile No results for input(s): "INR", "PROTIME" in the last 168 hours.  CBC: Recent Labs  Lab 09/21/22 1239 09/22/22 0849  WBC 7.4 6.4  NEUTROABS 3.6  --   HGB 13.2 13.9  HCT 40.7 43.0  MCV 95.3 96.6  PLT 356 322   Cardiac Enzymes: No results for input(s): "CKTOTAL", "CKMB", "CKMBINDEX", "TROPONINI" in the last 168 hours. BNP (last 3 results) No results for input(s): "PROBNP" in the  last 8760 hours. CBG: Recent Labs  Lab 09/15/22 1133 09/15/22 1727 09/15/22 2111 09/16/22 0752  GLUCAP 139* 126* 101* 135*   D-Dimer: No results for input(s): "DDIMER" in the last 72 hours. Hgb A1c: No results for input(s): "HGBA1C" in the last  72 hours. Lipid Profile: No results for input(s): "CHOL", "HDL", "LDLCALC", "TRIG", "CHOLHDL", "LDLDIRECT" in the last 72 hours. Thyroid function studies: No results for input(s): "TSH", "T4TOTAL", "T3FREE", "THYROIDAB" in the last 72 hours.  Invalid input(s): "FREET3" Anemia work up: No results for input(s): "VITAMINB12", "FOLATE", "FERRITIN", "TIBC", "IRON", "RETICCTPCT" in the last 72 hours. Sepsis Labs: Recent Labs  Lab 09/21/22 1239 09/21/22 1530 09/22/22 0849  WBC 7.4  --  6.4  LATICACIDVEN 2.5* 1.8  --     Microbiology Recent Results (from the past 240 hour(s))  Blood Culture (routine x 2)     Status: None (Preliminary result)   Collection Time: 09/21/22  2:02 PM   Specimen: BLOOD  Result Value Ref Range Status   Specimen Description BLOOD BLOOD LEFT HAND  Final   Special Requests   Final    BOTTLES DRAWN AEROBIC AND ANAEROBIC Blood Culture results may not be optimal due to an inadequate volume of blood received in culture bottles   Culture   Final    NO GROWTH < 24 HOURS Performed at [facility], [address], [place], [place] [address]    Report Status PENDING  Incomplete  Blood Culture (routine x 2)     Status: None (Preliminary result)   Collection Time: 09/21/22  2:02 PM   Specimen: BLOOD  Result Value Ref Range Status   Specimen Description BLOOD BLOOD LEFT WRIST  Final   Special Requests   Final    BOTTLES DRAWN AEROBIC AND ANAEROBIC Blood Culture results may not be optimal due to an inadequate volume of blood received in culture bottles   Culture   Final    NO GROWTH < 24 HOURS Performed at [facility], [address] [place], [place] [address]    Report Status PENDING  Incomplete   SARS Coronavirus 2 by RT PCR (hospital order, performed in [facility] hospital lab) *cepheid single result test*     Status: None   Collection Time: 09/21/22  2:02 PM  Result Value Ref Range Status   SARS Coronavirus 2 by RT PCR NEGATIVE NEGATIVE Final    Comment: (NOTE) SARS-CoV-2 target nucleic acids are NOT DETECTED.  The SARS-CoV-2 RNA is generally detectable in upper and lower respiratory specimens during the acute phase of infection. The lowest concentration of SARS-CoV-2 viral copies this assay can detect is 250 copies / mL. A negative result does not preclude SARS-CoV-2 infection and should not be used as the sole basis for treatment or other patient management decisions.  A negative result may occur with improper specimen collection / handling, submission of specimen other than nasopharyngeal swab, presence of viral mutation(s) within the areas targeted by this assay, and inadequate number of viral copies (<250 copies / mL). A negative result must be combined with clinical observations, patient history, and epidemiological information.  Fact Sheet for Patients:   RoadLapTop.co.za  Fact Sheet for Healthcare Providers: http://kim-miller.com/  This test is not yet approved or  cleared by the [place] FDA and has been authorized for detection and/or diagnosis of SARS-CoV-2 by FDA under an Emergency Use Authorization (EUA).  This EUA will remain in effect (meaning this test can be used) for the duration of the COVID-19 declaration under Section 564(b)(1) of the Act, 21 U.S.C. section 360bbb-3(b)(1), unless the authorization is terminated or revoked sooner.  Performed at [facility], [address], [place], [place] [address]     Procedures and diagnostic studies:  DG Chest Portable 1  View  Result Date: 09/21/2022 CLINICAL DATA:  Respiratory distress. EXAM: PORTABLE CHEST 1 VIEW COMPARISON:  Chest radiograph  dated 09/13/2022. FINDINGS: The heart size is mildly enlarged. There is mild bibasilar atelectasis/airspace disease. There is no significant pleural effusion or pneumothorax. Degenerative changes are seen in the spine. IMPRESSION: Mild bibasilar atelectasis/airspace disease. Electronically Signed   By: Romona Curls M.D.   On: 09/21/2022 13:22               LOS: 1 day   [name]  [facility]   Pager on [email or web address]. If 7PM-7AM, please contact night-coverage at [email or web address]     09/22/2022, 9:02 AM

## 2022-09-23 DIAGNOSIS — J441 Chronic obstructive pulmonary disease with (acute) exacerbation: Secondary | ICD-10-CM | POA: Diagnosis not present

## 2022-09-23 DIAGNOSIS — J9601 Acute respiratory failure with hypoxia: Secondary | ICD-10-CM | POA: Diagnosis not present

## 2022-09-23 MED ORDER — AZITHROMYCIN 250 MG PO TABS
500.0000 mg | ORAL_TABLET | Freq: Every day | ORAL | Status: DC
  Administered 2022-09-23 – 2022-09-24 (×2): 500 mg via ORAL
  Filled 2022-09-23 (×2): qty 2

## 2022-09-23 MED ORDER — ENZALUTAMIDE 40 MG PO TABS: 160.0000 mg | ORAL_TABLET | Freq: Every day | ORAL | Status: DC

## 2022-09-23 NOTE — Progress Notes (Signed)
PHARMACIST - PHYSICIAN COMMUNICATION  CONCERNING: Antibiotic IV to Oral Route Change Policy  RECOMMENDATION: This patient is receiving azithromycin by the intravenous route.  Based on criteria approved by the Pharmacy and Therapeutics Committee, the antibiotic(s) is/are being converted to the equivalent oral dose form(s).  DESCRIPTION: These criteria include: Patient being treated for a respiratory tract infection, urinary tract infection, cellulitis or clostridium difficile associated diarrhea if on metronidazole The patient is not neutropenic and does not exhibit a GI malabsorption state The patient is eating (either orally or via tube) and/or has been taking other orally administered medications for a least 24 hours The patient is improving clinically and has a Tmax < 100.5  If you have questions about this conversion, please contact the Pharmacy Department   Tressie Ellis 09/23/22

## 2022-09-23 NOTE — Evaluation (Signed)
Physical Therapy Evaluation Patient Details Name: [name] MRN: [id number] DOB: 12/12/1933 Today's Date: 09/23/2022  History of Present Illness  [name] is a 89 y.o. male with medical history significant of DVT, Atrial fibrillation , CHF, CKDIIIb-IV , DMII, Hypertension, OSA, prosate ca, CVA , DMII , Hyperlipidemia, COPD presenting with acute respiratory failure hypoxia.  Patient reports significant decrease in respiratory status over the past 12 to 24 hours.  Noted admission August 18 through August 26 for multiple issues including right extremity cellulitis, NSVT, acute on chronic stage III CKD.  Clinical Impression  Patient received in bed, he is pleasant and agrees to PT assessment. Patient received on 2 liters O2 via [place]. O2 saturations at 99%. Patient reports he has O2 at home but only wears at night. Supplemental O2 removed and patient ambulated ~60 feet with RW and cga. O2 saturations remained at 95% after mobility therefore supplemental O2 removed and patient was left on room air. NT notified. Patient will continue to benefit from skilled PT to improve strength and independence.           If plan is discharge home, recommend the following: A little help with walking and/or transfers;A little help with bathing/dressing/bathroom;Assist for transportation;Help with stairs or ramp for entrance;Assistance with cooking/housework   Can travel by private vehicle        Equipment Recommendations None recommended by PT  Recommendations for Other Services       Functional Status Assessment Patient has had a recent decline in their functional status and demonstrates the ability to make significant improvements in function in a reasonable and predictable amount of time.     Precautions / Restrictions Precautions Precautions: Fall Restrictions Weight Bearing Restrictions: No      Mobility  Bed Mobility Overal bed mobility: Needs Assistance Bed Mobility: Supine to Sit      Supine to sit: Supervision          Transfers Overall transfer level: Needs assistance Equipment used: Rolling walker (2 wheels) Transfers: Sit to/from Stand Sit to Stand: Contact guard assist                Ambulation/Gait Ambulation/Gait assistance: Contact guard assist Gait Distance (Feet): 60 Feet Assistive device: Rolling walker (2 wheels) Gait Pattern/deviations: Step-through pattern, Decreased stride length Gait velocity: decr     General Gait Details: patient requiring cga for safety. Ambulated on room air with O2 saturations remaining in 90%s  Stairs            Wheelchair Mobility     Tilt Bed    Modified Rankin (Stroke Patients Only)       Balance Overall balance assessment: Needs assistance Sitting-balance support: Feet supported Sitting balance-Leahy Scale: Good     Standing balance support: Bilateral upper extremity supported, During functional activity, Reliant on assistive device for balance Standing balance-Leahy Scale: Good                               Pertinent Vitals/Pain Pain Assessment Pain Assessment: No/denies pain    Home Living Family/patient expects to be discharged to:: Private residence Living Arrangements: Spouse/significant other   Type of Home: House Home Access: Stairs to enter Entrance Stairs-Rails: None Entrance Stairs-Number of Steps: 1-2   Home Layout: One level Home Equipment: Agricultural consultant (2 wheels)      Prior Function Prior Level of Function : Independent/Modified Independent;Driving  Mobility Comments: mod I ADLs Comments: Mod I     Extremity/Trunk Assessment   Upper Extremity Assessment Upper Extremity Assessment: Overall WFL for tasks assessed    Lower Extremity Assessment Lower Extremity Assessment: Generalized weakness    Cervical / Trunk Assessment Cervical / Trunk Assessment: Normal  Communication   Communication Communication: Hearing  impairment Cueing Techniques: Verbal cues;Gestural cues  Cognition Arousal: Alert Behavior During Therapy: WFL for tasks assessed/performed Overall Cognitive Status: Within Functional Limits for tasks assessed                                          General Comments      Exercises     Assessment/Plan    PT Assessment Patient needs continued PT services  PT Problem List Decreased strength;Decreased activity tolerance;Decreased balance;Decreased mobility       PT Treatment Interventions DME instruction;Gait training;Stair training;Functional mobility training;Therapeutic activities;Therapeutic exercise;Patient/family education;Balance training    PT Goals (Current goals can be found in the Care Plan section)  Acute Rehab PT Goals Patient Stated Goal: to return home PT Goal Formulation: With patient Time For Goal Achievement: 09/30/22 Potential to Achieve Goals: Good    Frequency Min 1X/week     Co-evaluation               AM-PAC PT "6 Clicks" Mobility  Outcome Measure Help needed turning from your back to your side while in a flat bed without using bedrails?: None Help needed moving from lying on your back to sitting on the side of a flat bed without using bedrails?: None Help needed moving to and from a bed to a chair (including a wheelchair)?: A Little Help needed standing up from a chair using your arms (e.g., wheelchair or bedside chair)?: A Little Help needed to walk in hospital room?: A Little Help needed climbing 3-5 steps with a railing? : A Lot 6 Click Score: 19    End of Session Equipment Utilized During Treatment: Gait belt Activity Tolerance: Patient tolerated treatment well Patient left: in chair;with call bell/phone within reach;with chair alarm set Nurse Communication: Mobility status PT Visit Diagnosis: Muscle weakness (generalized) (M62.81)    Time: 9518-8416 PT Time Calculation (min) (ACUTE ONLY): 18 min   Charges:   PT  Evaluation $PT Eval Low Complexity: 1 Low PT Treatments $Gait Training: 8-22 mins PT General Charges $$ ACUTE PT VISIT: 1 Visit         Ariyonna Twichell, PT, GCS 09/23/22,11:45 AM

## 2022-09-23 NOTE — Progress Notes (Signed)
Progress Note    [name]  GLO:[id number] DOB: 1933-09-24  DOA: 09/21/2022 PCP: [name], NP      Brief Narrative:    Medical records reviewed and are as summarized below:  [name] is a 89 y.o. male medical history significant of DVT, Atrial fibrillation , CHF, CKDIIIb-IV , DMII, Hypertension, OSA, prosate ca, CVA , DMII , Hyperlipidemia, COPD, recent hospitalization for right upper extremity cellulitis from 09/08/2022 through 09/16/2022.  He presented to the hospital because of shortness of breath and wheezing.  Reportedly, oxygen saturation was 84% on room air when EMS arrived.    He was admitted to the hospital for COPD exacerbation and acute hypoxic respiratory failure.   Assessment/Plan:   Principal Problem:   Acute respiratory failure with hypoxia (HCC) Active Problems:   COPD (chronic obstructive pulmonary disease) (HCC)   Chronic a-fib (HCC)   Chronic kidney disease, stage 3b (HCC)   Essential hypertension   Type II diabetes mellitus with renal manifestations (HCC)   Stroke (HCC)   Cellulitis of right upper extremity   (HFpEF) heart failure with preserved ejection fraction (HCC)    Body mass index is 31.39 kg/m.  (Obesity)   COPD exacerbation: Improving.  Continue prednisone, bronchodilators and antibiotics   Acute hypoxic respiratory failure: Improving.  Oxygen therapy has been decreased from 4 L to 2 L/min.  Wean off as able.  Initially required BiPAP.     Paroxysmal atrial fibrillation: Continue edoxaban and metoprolol.   CKD stage IIIb/CKD stage IV: He has had fluctuating creatinine for some time now exact stage is unknown at this time.   Chronic diastolic CHF: Continue Lasix   General Weakness: PT recommended home health therapy.   Other comorbidities include type II DM, hypertension, history of stroke, recent right upper extremity cellulitis   Plan discussed with Tiffany, HPOA.  She has been informed that patient  will likely be discharged home tomorrow.   Diet Order             Diet Heart Fluid consistency: Thin  Diet effective now                            Consultants: None  Procedures: None    Medications:    atorvastatin  10 mg Oral Daily   azithromycin  500 mg Oral Daily   edoxaban  30 mg Oral Q24H   enzalutamide  160 mg Oral Daily   furosemide  20 mg Oral Daily   metoprolol tartrate  37.5 mg Oral BID   pantoprazole  40 mg Oral Daily   predniSONE  40 mg Oral Q breakfast   Continuous Infusions:  cefTRIAXone (ROCEPHIN)  IV 2 g (09/23/22 1205)     Anti-infectives (From admission, onward)    Start     Dose/Rate Route Frequency Ordered Stop   09/23/22 1200  azithromycin (ZITHROMAX) tablet 500 mg        500 mg Oral Daily 09/23/22 0958 09/26/22 0959   09/22/22 1400  azithromycin (ZITHROMAX) 500 mg in sodium chloride 0.9 % 250 mL IVPB  Status:  Discontinued        500 mg 250 mL/hr over 60 Minutes Intravenous Every 24 hours 09/21/22 1609 09/23/22 0958   09/22/22 1200  cefTRIAXone (ROCEPHIN) 2 g in sodium chloride 0.9 % 100 mL IVPB        2 g 200 mL/hr over 30 Minutes Intravenous Every 24 hours  09/21/22 1609 09/27/22 1159   09/21/22 1615  cefTRIAXone (ROCEPHIN) 2 g in sodium chloride 0.9 % 100 mL IVPB  Status:  Discontinued        2 g 200 mL/hr over 30 Minutes Intravenous Every 24 hours 09/21/22 1609 09/21/22 1609   09/21/22 1615  azithromycin (ZITHROMAX) 500 mg in sodium chloride 0.9 % 250 mL IVPB  Status:  Discontinued        500 mg 250 mL/hr over 60 Minutes Intravenous Every 24 hours 09/21/22 1609 09/21/22 1609   09/21/22 1345  cefTRIAXone (ROCEPHIN) 2 g in sodium chloride 0.9 % 100 mL IVPB  Status:  Discontinued        2 g 200 mL/hr over 30 Minutes Intravenous Every 24 hours 09/21/22 1333 09/21/22 1609   09/21/22 1345  azithromycin (ZITHROMAX) 500 mg in sodium chloride 0.9 % 250 mL IVPB  Status:  Discontinued        500 mg 250 mL/hr over 60 Minutes  Intravenous Every 24 hours 09/21/22 1333 09/21/22 1609              Family Communication/Anticipated D/C date and plan/Code Status   DVT prophylaxis: SCDs Start: 09/21/22 1600 edoxaban (SAVAYSA) tablet 30 mg     Code Status: Full Code  Family Communication: Plan of care was discussed with Tiffany, HPOA, over the phone Disposition Plan: Plan to discharge home tomorrow   Status is: Inpatient Remains inpatient appropriate because: COPD exacerbation       Subjective:    No acute events overnight.  He complains of general weakness.  No shortness of breath or chest pain.  Overall, he feels better.  Objective:    Vitals:   09/23/22 0051 09/23/22 0418 09/23/22 0747 09/23/22 0817  BP: (!) 129/95 123/88  104/64  Pulse: 70 82  82  Resp: 16 16  18   Temp: 97.8 F (36.6 C)   (!) 97.4 F (36.3 C)  TempSrc: Oral     SpO2: 100% 100% 100% 99%  Weight:      Height:       No data found.   Intake/Output Summary (Last 24 hours) at 09/23/2022 1305 Last data filed at 09/23/2022 0437 Gross per 24 hour  Intake 677.79 ml  Output 450 ml  Net 227.79 ml   Filed Weights   09/21/22 1244 09/22/22 1702  Weight: 109.7 kg 105 kg    Exam:  GEN: NAD SKIN: Warm and dry EYES: No pallor or icterus ENT: MMM CV: RRR PULM: No wheezing or rales heard ABD: soft, ND, NT, +BS CNS: AAO x 3, non focal EXT: No edema or tenderness      Data Reviewed:   I have personally reviewed following labs and imaging studies:  Labs: Labs show the following:   Basic Metabolic Panel: Recent Labs  Lab 09/21/22 1239 09/22/22 0849  NA 137 139  K 3.7 4.5  CL 100 102  CO2 23 23  GLUCOSE 158* 157*  BUN 33* 29*  CREATININE 2.15* 1.77*  CALCIUM 9.0 9.3   GFR Estimated Creatinine Clearance: 36.8 mL/min (A) (by C-G formula based on SCr of 1.77 mg/dL (H)). Liver Function Tests: Recent Labs  Lab 09/21/22 1239 09/22/22 0849  AST 22 17  ALT 13 13  ALKPHOS 105 102  BILITOT 0.7 0.6  PROT  7.5 7.9  ALBUMIN 3.0* 3.2*   No results for input(s): "LIPASE", "AMYLASE" in the last 168 hours. No results for input(s): "AMMONIA" in the last 168 hours. Coagulation profile No  results for input(s): "INR", "PROTIME" in the last 168 hours.  CBC: Recent Labs  Lab 09/21/22 1239 09/22/22 0849  WBC 7.4 6.4  NEUTROABS 3.6  --   HGB 13.2 13.9  HCT 40.7 43.0  MCV 95.3 96.6  PLT 356 322   Cardiac Enzymes: No results for input(s): "CKTOTAL", "CKMB", "CKMBINDEX", "TROPONINI" in the last 168 hours. BNP (last 3 results) No results for input(s): "PROBNP" in the last 8760 hours. CBG: No results for input(s): "GLUCAP" in the last 168 hours.  D-Dimer: No results for input(s): "DDIMER" in the last 72 hours. Hgb A1c: No results for input(s): "HGBA1C" in the last 72 hours. Lipid Profile: No results for input(s): "CHOL", "HDL", "LDLCALC", "TRIG", "CHOLHDL", "LDLDIRECT" in the last 72 hours. Thyroid function studies: No results for input(s): "TSH", "T4TOTAL", "T3FREE", "THYROIDAB" in the last 72 hours.  Invalid input(s): "FREET3" Anemia work up: No results for input(s): "VITAMINB12", "FOLATE", "FERRITIN", "TIBC", "IRON", "RETICCTPCT" in the last 72 hours. Sepsis Labs: Recent Labs  Lab 09/21/22 1239 09/21/22 1530 09/22/22 0849  WBC 7.4  --  6.4  LATICACIDVEN 2.5* 1.8  --     Microbiology Recent Results (from the past 240 hour(s))  Blood Culture (routine x 2)     Status: None (Preliminary result)   Collection Time: 09/21/22  2:02 PM   Specimen: BLOOD  Result Value Ref Range Status   Specimen Description BLOOD BLOOD LEFT HAND  Final   Special Requests   Final    BOTTLES DRAWN AEROBIC AND ANAEROBIC Blood Culture results may not be optimal due to an inadequate volume of blood received in culture bottles   Culture   Final    NO GROWTH 2 DAYS Performed at [facility], [address] [place], [place] [address]    Report Status PENDING  Incomplete  Blood Culture (routine  x 2)     Status: None (Preliminary result)   Collection Time: 09/21/22  2:02 PM   Specimen: BLOOD  Result Value Ref Range Status   Specimen Description BLOOD BLOOD LEFT WRIST  Final   Special Requests   Final    BOTTLES DRAWN AEROBIC AND ANAEROBIC Blood Culture results may not be optimal due to an inadequate volume of blood received in culture bottles   Culture   Final    NO GROWTH 2 DAYS Performed at [facility], [address], [place], [place] [address]    Report Status PENDING  Incomplete  SARS Coronavirus 2 by RT PCR (hospital order, performed in [facility] hospital lab) *cepheid single result test*     Status: None   Collection Time: 09/21/22  2:02 PM  Result Value Ref Range Status   SARS Coronavirus 2 by RT PCR NEGATIVE NEGATIVE Final    Comment: (NOTE) SARS-CoV-2 target nucleic acids are NOT DETECTED.  The SARS-CoV-2 RNA is generally detectable in upper and lower respiratory specimens during the acute phase of infection. The lowest concentration of SARS-CoV-2 viral copies this assay can detect is 250 copies / mL. A negative result does not preclude SARS-CoV-2 infection and should not be used as the sole basis for treatment or other patient management decisions.  A negative result may occur with improper specimen collection / handling, submission of specimen other than nasopharyngeal swab, presence of viral mutation(s) within the areas targeted by this assay, and inadequate number of viral copies (<250 copies / mL). A negative result must be combined with clinical observations, patient history, and epidemiological information.  Fact Sheet for  Patients:   RoadLapTop.co.za  Fact Sheet for Healthcare Providers: http://kim-miller.com/  This test is not yet approved or  cleared by the [place] FDA and has been authorized for detection and/or diagnosis of SARS-CoV-2 by FDA under an Emergency Use Authorization (EUA).   This EUA will remain in effect (meaning this test can be used) for the duration of the COVID-19 declaration under Section 564(b)(1) of the Act, 21 U.S.C. section 360bbb-3(b)(1), unless the authorization is terminated or revoked sooner.  Performed at [facility], [address] [place], [place] [address]     Procedures and diagnostic studies:  No results found.             LOS: 2 days   Kolby Schara  [facility]   Pager on [email or web address]. If 7PM-7AM, please contact night-coverage at [email or web address]     09/23/2022, 1:05 PM

## 2022-09-23 NOTE — TOC Progression Note (Signed)
Transition of Care [facility]) - Progression Note    Patient Details  Name: [name] MRN: [id number] Date of Birth: [birth year]  Transition of Care [facility]) CM/SW Contact  Truddie Hidden, RN Phone Number: 09/23/2022, 4:26 PM  Clinical Narrative:    Spoke with patient regarding HH recommendation for [facility] therapy. He requested RNCM speak with his relative Tiffany. RNCM contacted Tiffany she is agreeable to [facility] and does not have a preference of an agency. She was advised the accepting agency would call her to schedule a SOC.  Referral sent to [facility] from [place].          Expected Discharge Plan and Services                                               Social Determinants of Health (SDOH) Interventions SDOH Screenings   Food Insecurity: No Food Insecurity (09/22/2022)  Housing: Patient Declined (09/22/2022)  Transportation Needs: No Transportation Needs (09/22/2022)  Utilities: Not At Risk (09/22/2022)  Alcohol Screen: Low Risk  (10/26/2021)  Depression (PHQ2-9): Low Risk  (03/05/2022)  Financial Resource Strain: Low Risk  (10/29/2019)  Recent Concern: Financial Resource Strain - Medium Risk (09/08/2019)  Physical Activity: Inactive (10/29/2019)  Social Connections: Moderately Integrated (10/29/2019)  Stress: No Stress Concern Present (10/29/2019)  Recent Concern: Stress - Stress Concern Present (09/08/2019)  Tobacco Use: Medium Risk (09/22/2022)    Readmission Risk Interventions    09/11/2022    1:23 PM 12/26/2020   12:08 PM  Readmission Risk Prevention Plan  Transportation Screening Complete Complete  PCP or Specialist Appt within 3-5 Days  Complete  HRI or Home Care Consult  Complete  Social Work Consult for Recovery Care Planning/Counseling  Complete  Palliative Care Screening  Not Applicable  Medication Review [facility]) Referral to Pharmacy   PCP or Specialist appointment within 3-5 days of discharge Complete   HRI or Home Care Consult Complete   SW Recovery  Care/Counseling Consult Complete   Palliative Care Screening Not Applicable   Skilled Nursing Facility Not Applicable

## 2022-09-24 ENCOUNTER — Inpatient Hospital Stay: Payer: Medicare Other | Admitting: Nurse Practitioner

## 2022-09-24 ENCOUNTER — Telehealth: Payer: Self-pay | Admitting: Nurse Practitioner

## 2022-09-24 ENCOUNTER — Ambulatory Visit: Payer: Self-pay

## 2022-09-24 DIAGNOSIS — J9601 Acute respiratory failure with hypoxia: Secondary | ICD-10-CM | POA: Diagnosis not present

## 2022-09-24 DIAGNOSIS — J441 Chronic obstructive pulmonary disease with (acute) exacerbation: Secondary | ICD-10-CM | POA: Diagnosis not present

## 2022-09-24 LAB — BLOOD GAS, VENOUS
Acid-Base Excess: 2 mmol/L (ref 0.0–2.0)
Bicarbonate: 27.3 mmol/L (ref 20.0–28.0)
O2 Saturation: 38.1 %
Patient temperature: 37
pCO2, Ven: 44 mmHg (ref 44–60)
pH, Ven: 7.4 (ref 7.25–7.43)

## 2022-09-24 NOTE — Patient Outreach (Signed)
  Care Coordination   09/24/2022 Name: [name] MRN: [id number] DOB: 1933-02-07   Care Coordination Outreach Attempts:  Successful contact made with patient.  Patient states he just arrived home from the hospital.  Patient request call back on another day.   Follow Up Plan:  Additional outreach attempts will be made to offer the patient care coordination information and services.   Encounter Outcome:  Pt. Request to Call Back   Care Coordination Interventions:  No, not indicated    George Ina Endo [facility] Care Coordination [phone] direct line

## 2022-09-24 NOTE — Plan of Care (Signed)

## 2022-09-24 NOTE — Telephone Encounter (Signed)
S/w spouse to schedule HF, she will discuss with patient and call me back-[name]

## 2022-09-24 NOTE — Discharge Summary (Signed)
Physician Discharge Summary   Patient: [name] MRN: [id number] DOB: 12/06/1933  Admit date:     09/21/2022  Discharge date: 09/24/2022  Discharge Physician: Lurene Shadow   PCP: [name], NP   Recommendations at discharge:    Follow up with PCP in 1 week  Discharge Diagnoses: Principal Problem:   Acute respiratory failure with hypoxia (HCC) Active Problems:   COPD (chronic obstructive pulmonary disease) (HCC)   Chronic a-fib (HCC)   Chronic kidney disease, stage 3b (HCC)   Essential hypertension   Type II diabetes mellitus with renal manifestations (HCC)   Stroke (HCC)   Cellulitis of right upper extremity   (HFpEF) heart failure with preserved ejection fraction (HCC)  Resolved Problems:   * No resolved hospital problems. *  Hospital Course:   [name] is a 89 y.o. male medical history significant of DVT, Atrial fibrillation , CHF, CKDIIIb-IV , DMII, Hypertension, OSA, prosate ca, CVA , DMII , Hyperlipidemia, COPD, recent hospitalization for right upper extremity cellulitis from 09/08/2022 through 09/16/2022.  He presented to the hospital because of shortness of breath and wheezing.  Reportedly, oxygen saturation was 84% on room air when EMS arrived.     He was admitted to the hospital for COPD exacerbation and acute hypoxic respiratory failure.   Assessment and Plan:   COPD exacerbation: Improved.  Continue bronchodilators at discharge. Completed antibiotics and steroids.      Acute hypoxic respiratory failure: Resolved. He's tolerating room air.   Previously required BiPAP followed by 4L/min oxygen.     Paroxysmal atrial fibrillation: Continue edoxaban and metoprolol.     CKD stage IIIb/CKD stage IV: He has had fluctuating creatinine for some time now exact stage is unknown at this time.     Chronic diastolic CHF: Continue Lasix     General Weakness: PT recommended home health therapy.     Other comorbidities include type II DM,  hypertension, history of stroke, recent right upper extremity cellulitis     His condition has improved and he's deemed stable for discharge.  Discharge plan was discussed with Tiffany, HPOA, over the phone.        Consultants: None Procedures performed: None  Disposition: Home health Diet recommendation:  Discharge Diet Orders (From admission, onward)     Start     Ordered   09/24/22 0000  Diet - low sodium heart healthy        09/24/22 0854           Cardiac and Carb modified diet DISCHARGE MEDICATION: Allergies as of 09/24/2022   No Known Allergies      Medication List     STOP taking these medications    amoxicillin-clavulanate 875-125 MG tablet Commonly known as: AUGMENTIN       TAKE these medications    albuterol 108 (90 Base) MCG/ACT inhaler Commonly known as: VENTOLIN HFA Inhale 2 puffs into the lungs every 4 (four) hours as needed for wheezing or shortness of breath.   atorvastatin 10 MG tablet Commonly known as: LIPITOR TAKE 1 TABLET(10 MG) BY MOUTH DAILY   cyanocobalamin 1000 MCG tablet Commonly known as: VITAMIN B12 Take 1 tablet (1,000 mcg total) by mouth daily.   dapagliflozin propanediol 10 MG Tabs tablet Commonly known as: FARXIGA Take 1 tablet (10 mg total) by mouth daily.   edoxaban 30 MG Tabs tablet Commonly known as: SAVAYSA Take 30 mg by mouth daily.   furosemide 20 MG tablet Commonly known as: LASIX Take 1  tablet (20 mg total) by mouth daily.   glipiZIDE 2.5 MG 24 hr tablet Commonly known as: GLUCOTROL XL Take 1 tablet (2.5 mg total) by mouth daily with breakfast.   Metoprolol Tartrate 37.5 MG Tabs Take 37.5 mg by mouth 2 (two) times daily.   pantoprazole 40 MG tablet Commonly known as: Protonix Take 1 tablet (40 mg total) by mouth daily.   polyethylene glycol 17 g packet Commonly known as: MIRALAX / GLYCOLAX Take 17 g by mouth 2 (two) times daily. Skip the dose if no constipation   Spiriva Respimat 2.5 MCG/ACT  Aers Generic drug: Tiotropium Bromide Monohydrate INHALE 2 PUFFS INTO THE LUNGS DAILY   Xtandi 40 MG tablet Generic drug: enzalutamide Take 4 tablets (160 mg total) by mouth daily.               Discharge Care Instructions  (From admission, onward)           Start     Ordered   09/24/22 0000  Discharge wound care:       Comments: Apply Mepilex border to right elbow wound   09/24/22 0946            Discharge Exam: Filed Weights   09/21/22 1244 09/22/22 1702  Weight: 109.7 kg 105 kg   GEN: NAD SKIN: Warm and dry EYES: No pallor or icterus ENT: MMM CV: RRR PULM: CTA B ABD: soft, ND, NT, +BS CNS: AAO x 3, non focal EXT: No edema or tenderness   Condition at discharge: good  The results of significant diagnostics from this hospitalization (including imaging, microbiology, ancillary and laboratory) are listed below for reference.   Imaging Studies: DG Chest Portable 1 View  Result Date: 09/21/2022 CLINICAL DATA:  Respiratory distress. EXAM: PORTABLE CHEST 1 VIEW COMPARISON:  Chest radiograph dated 09/13/2022. FINDINGS: The heart size is mildly enlarged. There is mild bibasilar atelectasis/airspace disease. There is no significant pleural effusion or pneumothorax. Degenerative changes are seen in the spine. IMPRESSION: Mild bibasilar atelectasis/airspace disease. Electronically Signed   By: Romona Curls M.D.   On: 09/21/2022 13:22   DG Elbow 2 Views Right  Result Date: 09/15/2022 CLINICAL DATA:  Right upper extremity cellulitis.  Joint effusion. EXAM: RIGHT ELBOW - 2 VIEW COMPARISON:  CT of the right elbow 09/11/2022 FINDINGS: The right elbow is located. Degenerative changes are again noted. Extensive soft tissue edema is again noted. A small joint effusion is noted. No acute osseous abnormalities are present. IMPRESSION: 1. Extensive soft tissue edema. 2. Small joint effusion. 3. No acute osseous abnormality. Electronically Signed   By: Marin Roberts  M.D.   On: 09/15/2022 11:09   NM Pulmonary Perfusion  Result Date: 09/13/2022 CLINICAL DATA:  PE suspected EXAM: NUCLEAR MEDICINE PERFUSION LUNG SCAN TECHNIQUE: Perfusion images were obtained in multiple projections after intravenous injection of radiopharmaceutical. Ventilation scans intentionally deferred if perfusion scan and chest x-ray adequate for interpretation during COVID 19 epidemic. RADIOPHARMACEUTICALS:  4.29 mCi Tc-74m MAA IV COMPARISON:  Same day chest radiograph FINDINGS: Cardiomegaly. Subtly heterogeneous pulmonary perfusion without suspicious peripheral or wedge-shaped perfusion defect. IMPRESSION: 1. Very low probability examination for pulmonary embolism by modified perfusion only PIOPED criteria (PE absent). 2.  Cardiomegaly. Electronically Signed   By: Jearld Lesch M.D.   On: 09/13/2022 18:19   DG Chest 2 View  Result Date: 09/13/2022 CLINICAL DATA:  Chest pain.  Shortness of breath. EXAM: CHEST - 2 VIEW COMPARISON:  Chest radiographs 06/30/2022, 06/27/2022, and 11/13/2021 FINDINGS: Cardiac silhouette  is moderately enlarged, similar to prior. Mediastinal contours are grossly within normal limits for low lung volumes and AP technique. There are mildly decreased lung volumes, worsened from prior. There is bibasilar horizontal linear bronchovascular crowding. No definite pulmonary edema. No pleural effusion is seen. No pneumothorax. Moderate multilevel degenerative disc changes of the thoracic spine. IMPRESSION: 1. Moderate cardiomegaly, similar to prior. 2. Mildly decreased lung volumes with bibasilar bronchovascular crowding. Electronically Signed   By: Neita Garnet M.D.   On: 09/13/2022 13:35   CT ELBOW RIGHT WO CONTRAST  Result Date: 09/12/2022 CLINICAL DATA:  Septic arthritis suspected, elbow.  Cellulitis EXAM: CT OF THE UPPER RIGHT EXTREMITY WITHOUT CONTRAST TECHNIQUE: Multidetector CT imaging of the upper right extremity was performed according to the standard protocol.  RADIATION DOSE REDUCTION: This exam was performed according to the departmental dose-optimization program which includes automated exposure control, adjustment of the mA and/or kV according to patient size and/or use of iterative reconstruction technique. COMPARISON:  09/09/2022 FINDINGS: Bones/Joint/Cartilage Right elbow intact without fracture or dislocation. Similar mild to moderate degenerative changes, most notable at the ulnohumeral articulation. Possible developing cortical erosion along the posteromedial aspect of the olecranon (series 12, images 59-61). Small elbow joint effusion, nonspecific. Ligaments Suboptimally assessed by CT. Muscles and Tendons No acute musculotendinous abnormality by CT. No well-defined intramuscular fluid collection. Soft tissues Extensive edema and ill-defined fluid throughout the soft tissues of the distal upper arm, elbow, and proximal forearm. The degree of swelling and edema appears to have progressed when compared to the previous CT. No well-defined or drainable fluid collection on non contrasted images. No soft tissue gas. IMPRESSION: 1. Extensive edema and ill-defined fluid throughout the soft tissues of the distal upper arm, elbow, and proximal forearm. The degree of swelling and edema appears to have progressed when compared to the previous CT. No well-defined or drainable fluid collection on non contrasted images. No soft tissue gas. 2. Possible developing cortical erosion along the posteromedial aspect of the olecranon concerning for osteomyelitis. Consider further evaluation with MRI of the right elbow with and without contrast. 3. Small elbow joint effusion, nonspecific. Septic arthritis not excluded. Electronically Signed   By: Duanne Guess D.O.   On: 09/12/2022 08:22   US Venous Img Upper Uni Right(DVT)  Result Date: 09/11/2022 CLINICAL DATA:  Right upper extremity swelling and cellulitis. History of DVT, on anticoagulation. Evaluate for DVT. EXAM: RIGHT  UPPER EXTREMITY VENOUS DOPPLER ULTRASOUND TECHNIQUE: Gray-scale sonography with graded compression, as well as color Doppler and duplex ultrasound were performed to evaluate the upper extremity deep venous system from the level of the subclavian vein and including the jugular, axillary, basilic, radial, ulnar and upper cephalic vein. Spectral Doppler was utilized to evaluate flow at rest and with distal augmentation maneuvers. COMPARISON:  None Available. FINDINGS: Contralateral Subclavian Vein: Respiratory phasicity is normal and symmetric with the symptomatic side. No evidence of thrombus. Normal compressibility. Internal Jugular Vein: No evidence of thrombus. Normal compressibility, respiratory phasicity and response to augmentation. Subclavian Vein: No evidence of thrombus. Normal compressibility, respiratory phasicity and response to augmentation. Axillary Vein: No evidence of thrombus. Normal compressibility, respiratory phasicity and response to augmentation. Cephalic Vein: No evidence of thrombus. Normal compressibility, respiratory phasicity and response to augmentation. Basilic Vein: No evidence of thrombus. Normal compressibility, respiratory phasicity and response to augmentation. Brachial Veins: No evidence of thrombus. Normal compressibility, respiratory phasicity and response to augmentation. Radial Veins: No evidence of thrombus. Normal compressibility, respiratory phasicity and response to augmentation. Ulnar Veins:  No evidence of thrombus. Normal compressibility, respiratory phasicity and response to augmentation. Other Findings: There is a moderate amount of subcutaneous edema at the level of the right forearm. IMPRESSION: No evidence of DVT within the right upper extremity. Electronically Signed   By: Simonne Come M.D.   On: 09/11/2022 16:18   CT Extrem Up Entire Arm R W/CM  Result Date: 09/09/2022 CLINICAL DATA:  Soft tissue infection suspected, forearm. Right arm cellulitis diagnosed 2 days  ago. Patient started on doxycycline. Despite being compliant with therapy, patient has noted increased pain and continued swelling and redness. Pain in right arm around elbow. Patient also noted in abrasion next to area of interest. EXAM: CT OF THE UPPER RIGHT EXTREMITY WITH CONTRAST TECHNIQUE: Multidetector CT imaging of the upper right extremity was performed according to the standard protocol following intravenous contrast administration. RADIATION DOSE REDUCTION: This exam was performed according to the departmental dose-optimization program which includes automated exposure control, adjustment of the mA and/or kV according to patient size and/or use of iterative reconstruction technique. CONTRAST:  70mL OMNIPAQUE IOHEXOL 300 MG/ML  SOLN COMPARISON:  CT right upper extremity without contrast 09/08/2022 FINDINGS: Please note recent CT of the same region of the right upper extremity was performed last night without IV contrast, 09/08/2022. The current study is with contrast. Bones/Joint/Cartilage Osteoarthritis is again seen within the right hand, greatest at the thumb carpometacarpal, thumb metacarpophalangeal, triscaphe, and DIP joints. Mild-to-moderate degenerative changes of the medial elbow are again noted. Moderate glenohumeral and acromioclavicular osteoarthritis. No acute fracture or dislocation.  No cortical erosion. There is anterior right sacroiliac joint bridging degenerative osteophytosis. Ligaments Suboptimally assessed by CT. Muscles and Tendons Normal size and density of the right arm musculature. No gross tendon tear is seen. Soft tissues There is again moderate edema and mild swelling of the predominantly dorsal and ulnar forearm subcutaneous fat, greatest at the posterior elbow no walled-off rim enhancing abscess is seen IMPRESSION: 1. Moderate edema and mild swelling of the predominantly dorsal and ulnar forearm subcutaneous fat, greatest at the posterior elbow. Not significantly changed from  last night's CT. No walled-off rim enhancing abscess is seen. 2. No acute osseous abnormality. 3. Osteoarthritis of the right hand, right shoulder, and right elbow. Electronically Signed   By: Neita Garnet M.D.   On: 09/09/2022 09:24   CT Extrem Up Entire Arm R WO/CM  Result Date: 09/09/2022 CLINICAL DATA:  Soft tissue infection suspected, forearm. No prior imaging. EXAM: CT OF THE UPPER RIGHT EXTREMITY WITHOUT CONTRAST TECHNIQUE: Multidetector CT imaging of the upper right extremity was performed according to the standard protocol. RADIATION DOSE REDUCTION: This exam was performed according to the departmental dose-optimization program which includes automated exposure control, adjustment of the mA and/or kV according to patient size and/or use of iterative reconstruction technique. COMPARISON:  None Available. FINDINGS: Bones/Joint/Cartilage Moderate glenohumeral joint space narrowing. Moderate anterior glenoid degenerative osteophytosis. Moderate to severe thumb carpometacarpal, moderate triscaphe, and mild-to-moderate second through fifth DIP osteoarthritis including joint space narrowing, subchondral sclerosis, and peripheral osteophytosis. There are well-circumscribed lucency is within multiple carpals, likely chronic/degenerative. A larger 10 mm well-circumscribed lucency within the capitate may represent a degenerative cyst versus a benign lesion such as an enchondroma. Mild to moderate medial elbow joint space narrowing with subchondral cystic change within the coronoid process and peripheral degenerative spurring within the coronoid process and olecranon. No acute fracture.  No cortical erosion. Ligaments Suboptimally assessed by CT. Muscles and Tendons Normal density and size of the  regional musculature. No gross tendon tear is visualized. Soft tissues There is moderate edema and swelling of the posterior medial/ulnar greater than radial forearm subcutaneous fat, greatest at the posterior elbow and  posterior proximal ulna (axial series 8, image 195 and sagittal series 11, image 101). No definite walled-off abscess is seen. No gross abnormality is seen within the partially visualized lungs. Mild-to-moderate multilevel degenerative disc and facet joint changes of the visualized cervical and thoracic spine. IMPRESSION: 1. Moderate edema and swelling of the posterior medial/ulnar greater than radial forearm subcutaneous fat, greatest at the posterior elbow and posterior proximal ulna. This is compatible with cellulitis. No definite walled-off abscess is seen. 2. No acute fracture. No cortical erosion. 3. Moderate glenohumeral, moderate to severe thumb carpometacarpal, moderate triscaphe, and mild-to-moderate second through fifth DIP osteoarthritis. 4. Mild-to-moderate medial elbow osteoarthritis. Electronically Signed   By: Neita Garnet M.D.   On: 09/09/2022 09:02    Microbiology: Results for orders placed or performed during the hospital encounter of 09/21/22  Blood Culture (routine x 2)     Status: None (Preliminary result)   Collection Time: 09/21/22  2:02 PM   Specimen: BLOOD  Result Value Ref Range Status   Specimen Description BLOOD BLOOD LEFT HAND  Final   Special Requests   Final    BOTTLES DRAWN AEROBIC AND ANAEROBIC Blood Culture results may not be optimal due to an inadequate volume of blood received in culture bottles   Culture   Final    NO GROWTH 3 DAYS Performed at [facility], [address], [place], [place] [address]    Report Status PENDING  Incomplete  Blood Culture (routine x 2)     Status: None (Preliminary result)   Collection Time: 09/21/22  2:02 PM   Specimen: BLOOD  Result Value Ref Range Status   Specimen Description BLOOD BLOOD LEFT WRIST  Final   Special Requests   Final    BOTTLES DRAWN AEROBIC AND ANAEROBIC Blood Culture results may not be optimal due to an inadequate volume of blood received in culture bottles   Culture   Final    NO GROWTH 3  DAYS Performed at [facility], [address], [place], [place] [address]    Report Status PENDING  Incomplete  SARS Coronavirus 2 by RT PCR (hospital order, performed in [facility] hospital lab) *cepheid single result test*     Status: None   Collection Time: 09/21/22  2:02 PM  Result Value Ref Range Status   SARS Coronavirus 2 by RT PCR NEGATIVE NEGATIVE Final    Comment: (NOTE) SARS-CoV-2 target nucleic acids are NOT DETECTED.  The SARS-CoV-2 RNA is generally detectable in upper and lower respiratory specimens during the acute phase of infection. The lowest concentration of SARS-CoV-2 viral copies this assay can detect is 250 copies / mL. A negative result does not preclude SARS-CoV-2 infection and should not be used as the sole basis for treatment or other patient management decisions.  A negative result may occur with improper specimen collection / handling, submission of specimen other than nasopharyngeal swab, presence of viral mutation(s) within the areas targeted by this assay, and inadequate number of viral copies (<250 copies / mL). A negative result must be combined with clinical observations, patient history, and epidemiological information.  Fact Sheet for Patients:   RoadLapTop.co.za  Fact Sheet for Healthcare Providers: http://kim-miller.com/  This test is not yet approved or  cleared by the [place] FDA and has been authorized for  detection and/or diagnosis of SARS-CoV-2 by FDA under an Emergency Use Authorization (EUA).  This EUA will remain in effect (meaning this test can be used) for the duration of the COVID-19 declaration under Section 564(b)(1) of the Act, 21 U.S.C. section 360bbb-3(b)(1), unless the authorization is terminated or revoked sooner.  Performed at [facility], [address] [place], [place] [address]     Labs: CBC: Recent Labs  Lab 09/21/22 1239 09/22/22 0849   WBC 7.4 6.4  NEUTROABS 3.6  --   HGB 13.2 13.9  HCT 40.7 43.0  MCV 95.3 96.6  PLT 356 322   Basic Metabolic Panel: Recent Labs  Lab 09/21/22 1239 09/22/22 0849  NA 137 139  K 3.7 4.5  CL 100 102  CO2 23 23  GLUCOSE 158* 157*  BUN 33* 29*  CREATININE 2.15* 1.77*  CALCIUM 9.0 9.3   Liver Function Tests: Recent Labs  Lab 09/21/22 1239 09/22/22 0849  AST 22 17  ALT 13 13  ALKPHOS 105 102  BILITOT 0.7 0.6  PROT 7.5 7.9  ALBUMIN 3.0* 3.2*   CBG: No results for input(s): "GLUCAP" in the last 168 hours.  Discharge time spent: greater than 30 minutes.  Signed: Lurene Shadow, MD [facility] 09/24/2022

## 2022-09-25 ENCOUNTER — Telehealth: Payer: Self-pay | Admitting: Nurse Practitioner

## 2022-09-25 ENCOUNTER — Other Ambulatory Visit: Payer: Self-pay | Admitting: Nurse Practitioner

## 2022-09-25 DIAGNOSIS — G4733 Obstructive sleep apnea (adult) (pediatric): Secondary | ICD-10-CM

## 2022-09-25 DIAGNOSIS — L03113 Cellulitis of right upper limb: Secondary | ICD-10-CM | POA: Diagnosis not present

## 2022-09-25 NOTE — Telephone Encounter (Signed)
Called patient to schedule hospital follow up. He said he will just wait until his 10/04/22 appointment-[name]

## 2022-09-26 LAB — CULTURE, BLOOD (ROUTINE X 2)
Culture: NO GROWTH
Culture: NO GROWTH

## 2022-09-27 ENCOUNTER — Ambulatory Visit: Payer: Self-pay

## 2022-09-27 NOTE — Patient Instructions (Signed)
Visit Information  Thank you for taking time to visit with me today. Please don't hesitate to contact me if I can be of assistance to you.   Following are the goals we discussed today:   Goals Addressed             This Visit's Progress    improvement post hospitalization       Interventions Today    Flowsheet Row Most Recent Value  Chronic Disease   Chronic disease during today's visit Chronic Obstructive Pulmonary Disease (COPD), Other  [right forearm/ elbow cellulitis]  General Interventions   General Interventions Discussed/Reviewed General Interventions Discussed, Doctor Visits  [evaluation of current treatment plan for mentioned health conditions and patients adherence to plan as establised by provider. Assessed for COPD symptoms and right arm cellulitis symptoms.]  Doctor Visits Discussed/Reviewed Doctor Visits Discussed  [reviewed upcoming provider visits. Confirmed patient has transportation to appointments. Advised to keep follow up appointments with provider.]  Education Interventions   Education Provided Provided Printed Education, Provided Education  [COPD signs/ symptoms discussed. Education article for COPD action plan sent to patient. Advised to notify provider for mild to moderate COPD symptoms. Call 911 for severe symptoms.]  Provided Verbal Education On Other  [reviewed cellulitis signs/ and symptoms.  Advised to notify provider for symptoms.]  Pharmacy Interventions   Pharmacy Dicussed/Reviewed Pharmacy Topics Discussed  [medications discussed and reviewed. Advised to take medications as prescribed.]              Our next appointment is by telephone on 10/25/22 at 2:30pm  Please call the care guide team at [phone] if you need to cancel or reschedule your appointment.   If you are experiencing a Mental Health or Behavioral Health Crisis or need someone to talk to, please call the Suicide and Crisis Lifeline: 988 call 1-800-273-TALK (toll free, 24 hour  hotline)  The patient verbalized understanding of instructions, educational materials, and care plan provided today and agreed to receive a mailed copy of patient instructions, educational materials, and care plan.   George Ina RN,BSN,CCM [facility] Care Coordination [phone] direct line  COPD Action Plan A COPD action plan is a description of what to do when you have a flare (exacerbation) of chronic obstructive pulmonary disease (COPD). Your action plan is a color-coded plan that lists the symptoms that indicate whether your condition is under control and what actions to take. If you have symptoms in the Philip Kotlyar zone, it means you are doing well that day. If you have symptoms in the yellow zone, it means you are having a bad day or an exacerbation. If you have symptoms in the red zone, you need urgent medical care. Follow the plan that you and your health care provider developed. Review your plan with your health care provider at each visit. Red zone Symptoms in this zone mean that you should get medical help right away. They include: Feeling very short of breath, even when you are resting. Not being able to do any activities because of poor breathing. Not being able to sleep because of poor breathing. Fever or shaking chills. Feeling confused or very sleepy. Chest pain. Coughing up blood. If you have any of these symptoms, call emergency services (911 in the [place] or go to the nearest emergency room. Yellow zone Symptoms in this zone mean that your condition may be getting worse. They include: Feeling more short of breath than usual. Having less energy for daily activities than usual. Phlegm or mucus that  is thicker than usual. Needing to use your rescue inhaler or nebulizer more often than usual. More ankle swelling than usual. Coughing more than usual. Feeling like you have a chest cold. Trouble sleeping due to COPD symptoms. Decreased appetite. COPD medicines not helping as  much as usual. If you experience any "yellow" symptoms: Keep taking your daily medicines as directed. Use your quick-relief inhaler as told by your health care provider. If you were prescribed steroid medicine to take by mouth (oral medicine), start taking it as told by your health care provider. If you were prescribed an antibiotic medicine, start taking it as told by your health care provider. Do not stop taking the antibiotic even if you start to feel better. Use oxygen as told by your health care provider. Get more rest. Do your pursed-lip breathing exercises. Do not smoke. Avoid any irritants in the air. If your signs and symptoms do not improve after taking these steps, call your health care provider right away. Tacori Kvamme zone Symptoms in this zone mean that you are doing well. They include: Being able to do your usual activities and exercise. Having the usual amount of coughing, including the same amount of phlegm or mucus. Being able to sleep well. Having a good appetite. Where to find more information: You can find more information about COPD from: American Lung Association, My COPD Action Plan: www.lung.org COPD Foundation: www.copdfoundation.org National Heart, Lung, & Blood Institute: [employer] Follow these instructions at home: Continue taking your daily medicines as told by your health care provider. Make sure you receive all the immunizations that your health care provider recommends, especially the pneumococcal and influenza vaccines. Wash your hands often with soap and water. Have family members wash their hands too. Regular hand washing can help prevent infections. Follow your usual exercise and diet plan. Avoid irritants in the air, such as smoke. Do not use any products that contain nicotine or tobacco. These products include cigarettes, chewing tobacco, and vaping devices, such as e-cigarettes. If you need help quitting, ask your health care provider. Summary A  COPD action plan tells you what to do when you have a flare (exacerbation) of chronic obstructive pulmonary disease (COPD). Follow each action plan for your symptoms. If you have any symptoms in the red zone, call emergency services (911 in the [place] or go to the nearest emergency room. This information is not intended to replace advice given to you by your health care provider. Make sure you discuss any questions you have with your health care provider. Document Revised: 11/16/2019 Document Reviewed: 11/16/2019 Elsevier Patient Education  2024 [employer].

## 2022-09-27 NOTE — Patient Outreach (Signed)
  Care Coordination   Initial Visit Note   09/27/2022 Name: [name] MRN: [id number] DOB: 28-Jan-1933  [name] is a 89 y.o. year old male who sees [name], NP for primary care. I spoke with  [name] by phone today.  What matters to the patients health and wellness today?  Patient states she is doing very well. Patient reports cellulitis to right forearm/ elbow has resolved. Patient states he had a follow up visit with the doctor regarding the cellulitis and he has been released.  Patient states he is not having any problems regarding his breathing.  He states he is taking all of his medications as prescribed. He reports wearing his CPAP at night. Per chart review patient is scheduled to see his primary care provider on 10/04/22.  Patient states he monitors his weight, blood pressure and blood sugars at home daily and record.  Patient states he has started back to work. He reports working 32 hrs per week.    Goals Addressed             This Visit's Progress    improvement post hospitalization       Interventions Today    Flowsheet Row Most Recent Value  Chronic Disease   Chronic disease during today's visit Chronic Obstructive Pulmonary Disease (COPD), Other  [right forearm/ elbow cellulitis]  General Interventions   General Interventions Discussed/Reviewed General Interventions Discussed, Doctor Visits  [evaluation of current treatment plan for mentioned health conditions and patients adherence to plan as establised by provider. Assessed for COPD symptoms and right arm cellulitis symptoms.]  Doctor Visits Discussed/Reviewed Doctor Visits Discussed  [reviewed upcoming provider visits. Confirmed patient has transportation to appointments. Advised to keep follow up appointments with provider.]  Education Interventions   Education Provided Provided Printed Education, Provided Education  [COPD signs/ symptoms discussed. Education article for COPD action plan  sent to patient. Advised to notify provider for mild to moderate COPD symptoms. Call 911 for severe symptoms.]  Provided Verbal Education On Other  [reviewed cellulitis signs/ and symptoms.  Advised to notify provider for symptoms.]  Pharmacy Interventions   Pharmacy Dicussed/Reviewed Pharmacy Topics Discussed  [medications discussed and reviewed. Advised to take medications as prescribed.]              SDOH assessments and interventions completed:  Yes  SDOH Interventions Today    Flowsheet Row Most Recent Value  SDOH Interventions   Food Insecurity Interventions Intervention Not Indicated  Housing Interventions Intervention Not Indicated  Transportation Interventions Intervention Not Indicated        Care Coordination Interventions:  Yes, provided   Follow up plan: Follow up call scheduled for 10/25/22    Encounter Outcome:  Patient Visit Completed   George Ina RN,BSN,CCM [facility] Care Coordination [phone] direct line

## 2022-10-04 ENCOUNTER — Ambulatory Visit (INDEPENDENT_AMBULATORY_CARE_PROVIDER_SITE_OTHER): Payer: Medicare Other | Admitting: Nurse Practitioner

## 2022-10-04 ENCOUNTER — Encounter: Payer: Self-pay | Admitting: Nurse Practitioner

## 2022-10-04 VITALS — BP 114/74 | HR 77 | Temp 97.2°F | Resp 16 | Ht 72.0 in | Wt 233.8 lb

## 2022-10-04 DIAGNOSIS — G4733 Obstructive sleep apnea (adult) (pediatric): Secondary | ICD-10-CM | POA: Diagnosis not present

## 2022-10-04 DIAGNOSIS — E1165 Type 2 diabetes mellitus with hyperglycemia: Secondary | ICD-10-CM

## 2022-10-04 DIAGNOSIS — J449 Chronic obstructive pulmonary disease, unspecified: Secondary | ICD-10-CM

## 2022-10-04 DIAGNOSIS — I10 Essential (primary) hypertension: Secondary | ICD-10-CM

## 2022-10-04 DIAGNOSIS — Z09 Encounter for follow-up examination after completed treatment for conditions other than malignant neoplasm: Secondary | ICD-10-CM

## 2022-10-04 LAB — POCT GLYCOSYLATED HEMOGLOBIN (HGB A1C): Hemoglobin A1C: 7.5 % — AB (ref 4.0–5.6)

## 2022-10-04 MED ORDER — OHTUVAYRE 3 MG/2.5ML IN SUSP: 3.0000 mg | Freq: Two times a day (BID) | RESPIRATORY_TRACT | 11 refills | Status: DC

## 2022-10-04 MED ORDER — ALBUTEROL SULFATE HFA 108 (90 BASE) MCG/ACT IN AERS: 2.0000 | INHALATION_SPRAY | RESPIRATORY_TRACT | 5 refills | Status: DC | PRN

## 2022-10-04 NOTE — Progress Notes (Signed)
[facility] [place], [place] [address] [place] [address] [phone]                                   Transitional Care Clinic   [facility] Discharge Acute Issues Care Follow Up                                                                        Patient Demographics  [name], is a 89 y.o. male  DOB 1933/09/14  MRN [id number].  Primary MD  [name], NP  Admit date:     09/21/2022  Discharge date: 09/24/2022    Reason for TCC follow Up - Acute respiratory failure with hypoxia    Past Medical History:  Diagnosis Date   Acute deep vein thrombosis (DVT) of femoral vein of left lower extremity (HCC)    Atrial fibrillation (HCC)    CHF (congestive heart failure) (HCC)    Chronic kidney disease    Diabetes (HCC)    Hearing loss    High blood pressure    History of bladder problems    Hyperlipidemia    Kidney stones 11/22/2014   Liver cyst    OSA (obstructive sleep apnea)    Prostate cancer (HCC)    Prostate cancer (HCC)    Stroke (HCC)    Valvular heart disease     Past Surgical History:  Procedure Laterality Date   CATARACT EXTRACTION     COLONOSCOPY WITH PROPOFOL N/A 07/02/2019   Procedure: COLONOSCOPY WITH PROPOFOL;  Surgeon: Toney Reil, MD;  Location: [facility] ENDOSCOPY;  Service: Gastroenterology;  Laterality: N/A;   ESOPHAGOGASTRODUODENOSCOPY (EGD) WITH PROPOFOL N/A 03/19/2022   Procedure: ESOPHAGOGASTRODUODENOSCOPY (EGD) WITH PROPOFOL;  Surgeon: Wyline Mood, MD;  Location: [facility] ENDOSCOPY;  Service: Gastroenterology;  Laterality: N/A;   IR CT HEAD LTD  08/27/2019   IR PERCUTANEOUS ART THROMBECTOMY/INFUSION INTRACRANIAL INC DIAG ANGIO  08/27/2019       IR PERCUTANEOUS ART THROMBECTOMY/INFUSION INTRACRANIAL INC DIAG ANGIO  08/27/2019   LOWER EXTREMITY ANGIOGRAPHY Left 12/25/2020   Procedure: Lower Extremity Angiography;  Surgeon: Annice Needy, MD;  Location: [facility] INVASIVE CV LAB;  Service: Cardiovascular;  Laterality:  Left;   PACEMAKER LEADLESS INSERTION N/A 12/19/2020   Procedure: PACEMAKER LEADLESS INSERTION;  Surgeon: Marcina Millard, MD;  Location: [facility] INVASIVE CV LAB;  Service: Cardiovascular;  Laterality: N/A;   PROSTATE CANCER     PROSTATE SURGERY     RADIOLOGY WITH ANESTHESIA N/A 08/27/2019   Procedure: IR WITH ANESTHESIA;  Surgeon: Julieanne Cotton, MD;  Location: [facility] OR;  Service: Radiology;  Laterality: N/A;     Recent HPI and Hospital Course  Hospital Course:     Glenn HOOPINGARNER is a 89 y.o. male medical history significant of DVT, Atrial fibrillation , CHF, CKDIIIb-IV , DMII, Hypertension, OSA, prosate ca, CVA , DMII , Hyperlipidemia, COPD, recent hospitalization for right upper extremity cellulitis from 09/08/2022 through 09/16/2022.  He presented to the hospital because of shortness of breath and wheezing.  Reportedly, oxygen saturation was 84% on room air when EMS arrived.     He was admitted to the hospital for COPD  exacerbation and acute hypoxic respiratory failure.     Assessment and Plan:   COPD exacerbation: Improved.  Continue bronchodilators at discharge. Completed antibiotics and steroids.    Acute hypoxic respiratory failure: Resolved. He's tolerating room air.   Previously required BiPAP followed by 4L/min oxygen.   Paroxysmal atrial fibrillation: Continue edoxaban and metoprolol.   CKD stage IIIb/CKD stage IV: He has had fluctuating creatinine for some time now exact stage is unknown at this time.   Chronic diastolic CHF: Continue Lasix   General Weakness: PT recommended home health therapy.   Other comorbidities include type II DM, hypertension, history of stroke, recent right upper extremity cellulitis   His condition has improved and he's deemed stable for discharge.  Discharge plan was discussed with Tiffany, HPOA, over the phone.    Post Hospital Acute Care Issue to be followed in the Clinic     Acute respiratory failure with hypoxia (HCC) Active  Problems:   COPD (chronic obstructive pulmonary disease) (HCC)   Chronic a-fib (HCC)   Chronic kidney disease, stage 3b (HCC)   Essential hypertension   Type II diabetes mellitus with renal manifestations (HCC)   Stroke (HCC)   Cellulitis of right upper extremity   (HFpEF) heart failure with preserved ejection fraction (HCC)   Resolved Problems:   * No resolved hospital problems. *   Subjective:   [name] today has, No headache, No chest pain, No abdominal pain - No Nausea, No new weakness tingling or numbness, No Cough - SOB.   Assessment & Plan    1. Hospital discharge follow-up Treated for acute respiratory failure with hypoxia.   2. Chronic obstructive pulmonary disease, unspecified COPD type (HCC) Will try Ohtuvayre, forms completed and sent to [facility]  - albuterol (VENTOLIN HFA) 108 (90 Base) MCG/ACT inhaler; Inhale 2 puffs into the lungs every 4 (four) hours as needed for wheezing or shortness of breath.  Dispense: 18 g; Refill: 5 - Ensifentrine (OHTUVAYRE) 3 MG/2.5ML SUSP; Inhale 3 mg into the lungs in the morning and at bedtime.  Dispense: 150 mL; Refill: 11  3. Type 2 diabetes mellitus with hyperglycemia, without long-term current use of insulin (HCC) A1c slightly improved. No changes  - POCT glycosylated hemoglobin (Hb A1C)  4. Essential hypertension Stable, no changes   5. OSA on CPAP Has been on CPAP for OSA since well before 2015. This was mention in encounter notes by his cardiologist years ago. He was actually diagnosed with OSA many years before 2015 per prior notes. We do not have any documentation regarding his CPAP settings and he does not remember who initially ordered his CPAP. He has had the same CPAP for over 5 years. He needs to switch DME companies to [facility] [employer] per his [facility] insurance requirement and he is also due for a new machine. He and his wife will look at the settings on his CPAP machine and call the clinic with them  so we can send a new order to the new DME company he must use. He also reports that he has not been using the water chamber on his cpap machine. He took it off and has not been adding any distilled water to his CPAP machine. After discussing this, he agreed that he will put it back on and start using the water chamber with the CPAP machine again.      Reason for frequent admissions/ER visits    COPD Chronic respiratory failure Diabetes Prostate cancer  Objective:   Vitals:   10/04/22 1021  BP: 114/74  Pulse: 77  Resp: 16  Temp: (!) 97.2 F (36.2 C)  SpO2: 95%  Weight: 233 lb 12.8 oz (106.1 kg)  Height: 6' (1.829 m)    Wt Readings from Last 3 Encounters:  10/04/22 233 lb 12.8 oz (106.1 kg)  09/22/22 231 lb 7.7 oz (105 kg)  09/12/22 226 lb 3.1 oz (102.6 kg)    Allergies as of 10/04/2022   No Known Allergies      Medication List        Accurate as of October 04, 2022  1:57 PM. If you have any questions, ask your nurse or doctor.          albuterol 108 (90 Base) MCG/ACT inhaler Commonly known as: VENTOLIN HFA Inhale 2 puffs into the lungs every 4 (four) hours as needed for wheezing or shortness of breath.   atorvastatin 10 MG tablet Commonly known as: LIPITOR TAKE 1 TABLET(10 MG) BY MOUTH DAILY   cyanocobalamin 1000 MCG tablet Commonly known as: VITAMIN B12 Take 1 tablet (1,000 mcg total) by mouth daily.   dapagliflozin propanediol 10 MG Tabs tablet Commonly known as: FARXIGA Take 1 tablet (10 mg total) by mouth daily.   edoxaban 30 MG Tabs tablet Commonly known as: SAVAYSA Take 30 mg by mouth daily.   furosemide 20 MG tablet Commonly known as: LASIX Take 1 tablet (20 mg total) by mouth daily.   glipiZIDE 2.5 MG 24 hr tablet Commonly known as: GLUCOTROL XL Take 1 tablet (2.5 mg total) by mouth daily with breakfast.   Metoprolol Tartrate 37.5 MG Tabs Take 37.5 mg by mouth 2 (two) times daily.   Ohtuvayre 3 MG/2.5ML Susp Generic drug:  Ensifentrine Inhale 3 mg into the lungs in the morning and at bedtime. Started by: [name]   pantoprazole 40 MG tablet Commonly known as: Protonix Take 1 tablet (40 mg total) by mouth daily.   polyethylene glycol 17 g packet Commonly known as: MIRALAX / GLYCOLAX Take 17 g by mouth 2 (two) times daily. Skip the dose if no constipation   Spiriva Respimat 2.5 MCG/ACT Aers Generic drug: Tiotropium Bromide Monohydrate INHALE 2 PUFFS INTO THE LUNGS DAILY   Xtandi 40 MG tablet Generic drug: enzalutamide Take 4 tablets (160 mg total) by mouth daily.         Physical Exam: Constitutional: Patient appears well-developed and well-nourished. Not in obvious distress. HENT: Normocephalic, atraumatic, External right and left ear normal. Oropharynx is clear and moist.  Eyes: Conjunctivae and EOM are normal. PERRLA, no scleral icterus. Neck: Normal ROM. Neck supple. No JVD. No tracheal deviation. No thyromegaly. CVS: RRR, S1/S2 +, no murmurs, no gallops, no carotid bruit.  Pulmonary: Effort and breath sounds normal, no stridor, rhonchi, wheezes, rales.  Abdominal: Soft. BS +, no distension, tenderness, rebound or guarding.  Musculoskeletal: Normal range of motion. No edema and no tenderness.  Lymphadenopathy: No lymphadenopathy noted, cervical, inguinal or axillary Neuro: Alert. Normal reflexes, muscle tone coordination. No cranial nerve deficit. Skin: Skin is warm and dry. No rash noted. Not diaphoretic. No erythema. No pallor. Psychiatric: Normal mood and affect. Behavior, judgment, thought content normal.   Data Review   Micro Results No results found for this or any previous visit (from the past 240 hour(s)).   CBC No results for input(s): "WBC", "HGB", "HCT", "PLT", "MCV", "MCH", "MCHC", "RDW", "LYMPHSABS", "MONOABS", "EOSABS", "BASOSABS", "BANDABS" in the last 168 hours.  Invalid input(s): "NEUTRABS", "BANDSABD"  Chemistries  No results for input(s): "NA", "K", "CL",  "CO2", "GLUCOSE", "BUN", "CREATININE", "CALCIUM", "MG", "AST", "ALT", "ALKPHOS", "BILITOT" in the last 168 hours.  Invalid input(s): "GFRCGP" ------------------------------------------------------------------------------------------------------------------ estimated creatinine clearance is 37 mL/min (A) (by C-G formula based on SCr of 1.77 mg/dL (H)). ------------------------------------------------------------------------------------------------------------------ Recent Labs    10/04/22 1045  HGBA1C 7.5*   ------------------------------------------------------------------------------------------------------------------ No results for input(s): "CHOL", "HDL", "LDLCALC", "TRIG", "CHOLHDL", "LDLDIRECT" in the last 72 hours. ------------------------------------------------------------------------------------------------------------------ No results for input(s): "TSH", "T4TOTAL", "T3FREE", "THYROIDAB" in the last 72 hours.  Invalid input(s): "FREET3" ------------------------------------------------------------------------------------------------------------------ No results for input(s): "VITAMINB12", "FOLATE", "FERRITIN", "TIBC", "IRON", "RETICCTPCT" in the last 72 hours.  Coagulation profile No results for input(s): "INR", "PROTIME" in the last 168 hours.  No results for input(s): "DDIMER" in the last 72 hours.  Cardiac Enzymes No results for input(s): "CKMB", "TROPONINI", "MYOGLOBIN" in the last 168 hours.  Invalid input(s): "CK" ------------------------------------------------------------------------------------------------------------------ Invalid input(s): "POCBNP"  Return for previously scheduled, medicare wellness in december with Camil Wilhelmsen PCP.    Time Spent in minutes  45 Time spent with patient included reviewing progress notes, labs, imaging studies, and discussing plan for follow up.   This patient was seen by [name], FNP-C in collaboration with Dr. Beverely Risen  as a part of collaborative care agreement.    [name] M.D on 10/04/2022 at 1:57 PM   **Disclaimer: This note may have been dictated with voice recognition software. Similar sounding words can inadvertently be transcribed and this note may contain transcription errors which may not have been corrected upon publication of note.**

## 2022-10-07 ENCOUNTER — Telehealth: Payer: Self-pay | Admitting: Nurse Practitioner

## 2022-10-07 ENCOUNTER — Telehealth: Payer: Self-pay

## 2022-10-07 NOTE — Telephone Encounter (Signed)
Faxed and emailed transition order & all documents to [place] with [employer]; [phone]

## 2022-10-07 NOTE — Telephone Encounter (Signed)
[name]  [id number]

## 2022-10-11 ENCOUNTER — Inpatient Hospital Stay: Payer: Medicare Other | Attending: Oncology

## 2022-10-11 ENCOUNTER — Telehealth: Payer: Self-pay | Admitting: Nurse Practitioner

## 2022-10-11 DIAGNOSIS — I480 Paroxysmal atrial fibrillation: Secondary | ICD-10-CM | POA: Diagnosis not present

## 2022-10-11 DIAGNOSIS — N1832 Chronic kidney disease, stage 3b: Secondary | ICD-10-CM | POA: Insufficient documentation

## 2022-10-11 DIAGNOSIS — C61 Malignant neoplasm of prostate: Secondary | ICD-10-CM | POA: Insufficient documentation

## 2022-10-11 DIAGNOSIS — Z79899 Other long term (current) drug therapy: Secondary | ICD-10-CM | POA: Insufficient documentation

## 2022-10-11 DIAGNOSIS — Z87891 Personal history of nicotine dependence: Secondary | ICD-10-CM | POA: Insufficient documentation

## 2022-10-11 LAB — CMP (CANCER CENTER ONLY)
ALT: 14 U/L (ref 0–44)
AST: 18 U/L (ref 15–41)
Albumin: 3.2 g/dL — ABNORMAL LOW (ref 3.5–5.0)
Alkaline Phosphatase: 140 U/L — ABNORMAL HIGH (ref 38–126)
Anion gap: 9 (ref 5–15)
BUN: 26 mg/dL — ABNORMAL HIGH (ref 8–23)
CO2: 22 mmol/L (ref 22–32)
Calcium: 8.8 mg/dL — ABNORMAL LOW (ref 8.9–10.3)
Chloride: 105 mmol/L (ref 98–111)
Creatinine: 1.83 mg/dL — ABNORMAL HIGH (ref 0.61–1.24)
GFR, Estimated: 35 mL/min — ABNORMAL LOW (ref >60)
Glucose, Bld: 151 mg/dL — ABNORMAL HIGH (ref 70–99)
Potassium: 3.7 mmol/L (ref 3.5–5.1)
Sodium: 136 mmol/L (ref 135–145)
Total Bilirubin: 0.7 mg/dL (ref 0.3–1.2)
Total Protein: 6.7 g/dL (ref 6.5–8.1)

## 2022-10-11 LAB — CBC WITH DIFFERENTIAL (CANCER CENTER ONLY)
Abs Immature Granulocytes: 0.02 10*3/uL (ref 0.00–0.07)
Basophils Absolute: 0 10*3/uL (ref 0.0–0.1)
Basophils Relative: 1 %
Eosinophils Absolute: 0.2 10*3/uL (ref 0.0–0.5)
Eosinophils Relative: 5 %
HCT: 44.1 % (ref 39.0–52.0)
Hemoglobin: 14.2 g/dL (ref 13.0–17.0)
Immature Granulocytes: 1 %
Lymphocytes Relative: 26 %
Lymphs Abs: 1.2 10*3/uL (ref 0.7–4.0)
MCH: 31.2 pg (ref 26.0–34.0)
MCHC: 32.2 g/dL (ref 30.0–36.0)
MCV: 96.9 fL (ref 80.0–100.0)
Monocytes Absolute: 0.7 10*3/uL (ref 0.1–1.0)
Monocytes Relative: 17 %
Neutro Abs: 2.3 10*3/uL (ref 1.7–7.7)
Neutrophils Relative %: 50 %
Platelet Count: 158 10*3/uL (ref 150–400)
RBC: 4.55 MIL/uL (ref 4.22–5.81)
RDW: 15.2 % (ref 11.5–15.5)
WBC Count: 4.4 10*3/uL (ref 4.0–10.5)
nRBC: 0 % (ref 0.0–0.2)

## 2022-10-11 LAB — PSA: Prostatic Specific Antigen: 1.7 ng/mL (ref 0.00–4.00)

## 2022-10-11 NOTE — Telephone Encounter (Signed)
Received [employer] urgent order for O2 concentrator, cannula, and tubing. Gave to [facility] for signature-nm

## 2022-10-14 ENCOUNTER — Telehealth: Payer: Self-pay | Admitting: Nurse Practitioner

## 2022-10-14 NOTE — Telephone Encounter (Signed)
Received fax from [facility] requesting transition order for patient's cpap and supplies which I faxed to [employer] on 10/07/22. I sent community message to Glory Rosebush, Thompson Grayer & Elease Hashimoto with [employer] to get update-[name]

## 2022-10-15 ENCOUNTER — Encounter: Payer: Self-pay | Admitting: Oncology

## 2022-10-15 ENCOUNTER — Inpatient Hospital Stay: Payer: Medicare Other | Admitting: Oncology

## 2022-10-15 ENCOUNTER — Other Ambulatory Visit: Payer: Self-pay

## 2022-10-15 VITALS — BP 120/71 | HR 81 | Temp 97.1°F | Resp 18 | Wt 241.3 lb

## 2022-10-15 DIAGNOSIS — C61 Malignant neoplasm of prostate: Secondary | ICD-10-CM | POA: Diagnosis not present

## 2022-10-15 DIAGNOSIS — Z79899 Other long term (current) drug therapy: Secondary | ICD-10-CM | POA: Diagnosis not present

## 2022-10-15 DIAGNOSIS — I480 Paroxysmal atrial fibrillation: Secondary | ICD-10-CM

## 2022-10-15 DIAGNOSIS — N1832 Chronic kidney disease, stage 3b: Secondary | ICD-10-CM | POA: Diagnosis not present

## 2022-10-15 DIAGNOSIS — Z87891 Personal history of nicotine dependence: Secondary | ICD-10-CM | POA: Diagnosis not present

## 2022-10-15 MED ORDER — ENZALUTAMIDE 40 MG PO TABS
160.0000 mg | ORAL_TABLET | Freq: Every day | ORAL | 2 refills | Status: DC
Start: 2022-10-15 — End: 2023-02-04
  Filled 2022-10-15 – 2022-10-17 (×2): qty 120, 30d supply, fill #0
  Filled 2022-11-28: qty 120, 30d supply, fill #1
  Filled 2022-12-26: qty 120, 30d supply, fill #2
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
## 2022-10-15 NOTE — Assessment & Plan Note (Addendum)
Prostate cancer recurrence.  Non- regional (retroperitoneal) lymphadenopathy -M1a Labs reviewed and discussed with patient.  PSA progressively trending up to 83. 01/05/2021 Bone scan showed No convincing scintigraphic evidence of bony metastatic disease. 01/05/2021 Mild increase in size of left retroperitoneal, right common iliac and right external iliac lymph nodes compatible with progression of disease.No CT findings of osseous metastasis. Labs are reviewed and discussed with patient. PSA 1.7 Patient is on androgen deprivation therapy with Eligard 45 mg every 6 months-  next due Dec 2024 Clinically he tolerates Xtandi 160mg  daily well. Continue current regimen

## 2022-10-15 NOTE — Assessment & Plan Note (Signed)
Encourage oral hydration and avoid nephrotoxins.   

## 2022-10-15 NOTE — Assessment & Plan Note (Signed)
Currently on Edoxaban, follow up with cardiology

## 2022-10-17 ENCOUNTER — Other Ambulatory Visit (HOSPITAL_COMMUNITY): Payer: Self-pay

## 2022-10-17 ENCOUNTER — Other Ambulatory Visit: Payer: Self-pay

## 2022-10-17 ENCOUNTER — Telehealth: Payer: Self-pay | Admitting: Nurse Practitioner

## 2022-10-17 NOTE — Progress Notes (Signed)
Specialty Pharmacy Refill Coordination Note  [name] is a 89 y.o. male contacted today regarding refills of specialty medication(s) Enzalutamide .  Patient requested Delivery  on 10/22/22  to verified address [address] [facility] [address]   [place] [address]   Medication will be filled on 10/21/22.

## 2022-10-17 NOTE — Telephone Encounter (Signed)
Working with [name] w/ [facility] for o2 transition to [employer]-[name]

## 2022-10-21 DIAGNOSIS — G4733 Obstructive sleep apnea (adult) (pediatric): Secondary | ICD-10-CM | POA: Diagnosis not present

## 2022-10-22 ENCOUNTER — Telehealth: Payer: Self-pay | Admitting: Nurse Practitioner

## 2022-10-22 NOTE — Telephone Encounter (Signed)
Sent message to  Glory Rosebush, Thompson Grayer & Elease Hashimoto with [employer] regarding O2 transition-[name]

## 2022-10-23 ENCOUNTER — Telehealth: Payer: Self-pay | Admitting: Nurse Practitioner

## 2022-10-23 NOTE — Telephone Encounter (Signed)
Per Tiffany, patient's daughter, he is not on O2 at night, just cpap-[name]

## 2022-10-25 ENCOUNTER — Ambulatory Visit: Payer: Self-pay

## 2022-10-25 NOTE — Patient Outreach (Signed)
Care Coordination   10/25/2022 Name: [name] MRN: [id number] DOB: Mar 29, 1933   Care Coordination Outreach Attempts:  Successful contact made with patient.  Patient states he is not able to talk now and request to reschedule call.   Follow Up Plan:  Additional outreach attempts will be made to offer the patient care coordination information and services.   Encounter Outcome:  Patient Request to Call Back  Call rescheduled for 11/01/22 at 10:30 am.   Care Coordination Interventions:  No, not indicated    .cdg

## 2022-10-28 DIAGNOSIS — E1122 Type 2 diabetes mellitus with diabetic chronic kidney disease: Secondary | ICD-10-CM | POA: Diagnosis not present

## 2022-10-28 DIAGNOSIS — N2581 Secondary hyperparathyroidism of renal origin: Secondary | ICD-10-CM | POA: Diagnosis not present

## 2022-10-28 DIAGNOSIS — I10 Essential (primary) hypertension: Secondary | ICD-10-CM | POA: Diagnosis not present

## 2022-10-28 DIAGNOSIS — R600 Localized edema: Secondary | ICD-10-CM | POA: Diagnosis not present

## 2022-10-28 DIAGNOSIS — N1832 Chronic kidney disease, stage 3b: Secondary | ICD-10-CM | POA: Diagnosis not present

## 2022-10-29 DIAGNOSIS — R600 Localized edema: Secondary | ICD-10-CM | POA: Diagnosis not present

## 2022-10-29 DIAGNOSIS — N2581 Secondary hyperparathyroidism of renal origin: Secondary | ICD-10-CM | POA: Diagnosis not present

## 2022-10-29 DIAGNOSIS — N1832 Chronic kidney disease, stage 3b: Secondary | ICD-10-CM | POA: Diagnosis not present

## 2022-10-29 DIAGNOSIS — E1122 Type 2 diabetes mellitus with diabetic chronic kidney disease: Secondary | ICD-10-CM | POA: Diagnosis not present

## 2022-10-29 DIAGNOSIS — I10 Essential (primary) hypertension: Secondary | ICD-10-CM | POA: Diagnosis not present

## 2022-10-30 ENCOUNTER — Telehealth: Payer: Self-pay

## 2022-10-30 NOTE — Telephone Encounter (Signed)
[employer] called for oxygen order advised her as per pt daughter and we don't have any record that pt is on Oxygen he is only on Cpap

## 2022-11-01 ENCOUNTER — Ambulatory Visit: Payer: Self-pay

## 2022-11-01 NOTE — Patient Instructions (Signed)
Visit Information  Thank you for taking time to visit with me today. Please don't hesitate to contact me if I can be of assistance to you.   Following are the goals we discussed today:   Goals Addressed             This Visit's Progress    improvement post hospitalization       Interventions Today    Flowsheet Row Most Recent Value  Chronic Disease   Chronic disease during today's visit Chronic Obstructive Pulmonary Disease (COPD), Other  [right forearm/ elbow cellulitis]  General Interventions   General Interventions Discussed/Reviewed General Interventions Reviewed, Doctor Visits  [evaluation of current treatment plan and patients adherence to plan as established by provider.  Assessed for COPD symptoms and ongoing forearm/ elbow cellulitis.]  Doctor Visits Discussed/Reviewed Doctor Visits Reviewed  [reviewed upcoming provider visits. Advised to follow up with provider as recommended.]  Exercise Interventions   Exercise Discussed/Reviewed Physical Activity  Education Interventions   Education Provided Provided Education  [Discussed COPD symptoms and action plan.  Advised to notify provider for any increase in COPD symptoms.]  Nutrition Interventions   Nutrition Discussed/Reviewed Nutrition Reviewed, Adding fruits and vegetables, Decreasing sugar intake, Increasing proteins  [lean proteins]  Pharmacy Interventions   Pharmacy Dicussed/Reviewed Pharmacy Topics Reviewed  Algis Downs to take medications as prescribed.]              Our next appointment is by telephone on 12/20/22 at 10:30 am  Please call the care guide team at [phone] if you need to cancel or reschedule your appointment.   If you are experiencing a Mental Health or Behavioral Health Crisis or need someone to talk to, please call the Suicide and Crisis Lifeline: 988 call 1-800-273-TALK (toll free, 24 hour hotline)  The patient verbalized understanding of instructions, educational materials, and care plan  provided today and agreed to receive a mailed copy of patient instructions, educational materials, and care plan.   George Ina RN,BSN,CCM [facility] Care Coordination [phone] direct line

## 2022-11-01 NOTE — Patient Outreach (Signed)
Care Coordination   Follow Up Visit Note   11/01/2022 Name: [name] MRN: [id number] DOB: 06/07/1933  [name] is a 89 y.o. year old male who sees [name], NP for primary care. I spoke with  [name] by phone today.  What matters to the patients health and wellness today?  Patient states he is doing well.  He reports cellulitis to right forearm/ elbow area has completely resolved. Patient denies having any breathing issues.  He states he is back working 32 hrs per week.  Patient reports having follow up visit with nephrologist on 10/29/22. He states he was advised to be more mindful regarding his food choices by eating a healthy diet.  Patient states otherwise his nephrology appointment went well.  He denies having any issues.     Goals Addressed             This Visit's Progress    improvement post hospitalization       Interventions Today    Flowsheet Row Most Recent Value  Chronic Disease   Chronic disease during today's visit Chronic Obstructive Pulmonary Disease (COPD), Other  [right forearm/ elbow cellulitis]  General Interventions   General Interventions Discussed/Reviewed General Interventions Reviewed, Doctor Visits  [evaluation of current treatment plan and patients adherence to plan as established by provider.  Assessed for COPD symptoms and ongoing forearm/ elbow cellulitis.]  Doctor Visits Discussed/Reviewed Doctor Visits Reviewed  [reviewed upcoming provider visits. Advised to follow up with provider as recommended.]  Exercise Interventions   Exercise Discussed/Reviewed Physical Activity  Education Interventions   Education Provided Provided Education  [Discussed COPD symptoms and action plan.  Advised to notify provider for any increase in COPD symptoms.]  Nutrition Interventions   Nutrition Discussed/Reviewed Nutrition Reviewed, Adding fruits and vegetables, Decreasing sugar intake, Increasing proteins  [lean proteins]  Pharmacy  Interventions   Pharmacy Dicussed/Reviewed Pharmacy Topics Reviewed  [name] to take medications as prescribed.]              SDOH assessments and interventions completed:  No     Care Coordination Interventions:  Yes, provided   Follow up plan: Follow up call scheduled for 12/20/22     Encounter Outcome:  Patient Visit Completed   George Ina RN,BSN,CCM [facility] Care Coordination [phone] direct line

## 2022-11-06 ENCOUNTER — Other Ambulatory Visit (HOSPITAL_COMMUNITY): Payer: Self-pay

## 2022-11-06 ENCOUNTER — Other Ambulatory Visit: Payer: Self-pay

## 2022-11-06 ENCOUNTER — Other Ambulatory Visit: Payer: Self-pay | Admitting: Nurse Practitioner

## 2022-11-06 DIAGNOSIS — J449 Chronic obstructive pulmonary disease, unspecified: Secondary | ICD-10-CM

## 2022-11-20 DIAGNOSIS — G4733 Obstructive sleep apnea (adult) (pediatric): Secondary | ICD-10-CM | POA: Diagnosis not present

## 2022-11-26 ENCOUNTER — Other Ambulatory Visit: Payer: Self-pay

## 2022-11-28 ENCOUNTER — Other Ambulatory Visit: Payer: Self-pay

## 2022-11-28 ENCOUNTER — Other Ambulatory Visit (HOSPITAL_COMMUNITY): Payer: Self-pay

## 2022-11-28 NOTE — Progress Notes (Signed)
Specialty Pharmacy Refill Coordination Note  [name] is a 89 y.o. male contacted today regarding refills of specialty medication(s) Enzalutamide   Patient requested Delivery   Delivery date: 12/06/22   Verified address: [address] [place] [address]   Medication will be filled on 12/05/22.

## 2022-11-28 NOTE — Progress Notes (Signed)
Specialty Pharmacy Ongoing Clinical Assessment Note  [name] is a 89 y.o. male who is being followed by the specialty pharmacy service for RxSp Oncology   Patient's specialty medication(s) reviewed today: Enzalutamide   Missed doses in the last 4 weeks: 0   Patient/Caregiver did not have any additional questions or concerns.   Therapeutic benefit summary: Patient is achieving benefit   Adverse events/side effects summary: No adverse events/side effects   Patient's therapy is appropriate to: Continue    Goals Addressed             This Visit's Progress    Slow Disease Progression       Patient is on track. Patient will maintain adherence.  [name]'s PSA continues to decline and was most recently 1.70 ng/mL on 10/11/22.         Follow up:  6 months  Servando Snare Specialty Pharmacist

## 2022-12-05 DIAGNOSIS — J342 Deviated nasal septum: Secondary | ICD-10-CM | POA: Diagnosis not present

## 2022-12-05 DIAGNOSIS — R040 Epistaxis: Secondary | ICD-10-CM | POA: Diagnosis not present

## 2022-12-21 DIAGNOSIS — G4733 Obstructive sleep apnea (adult) (pediatric): Secondary | ICD-10-CM | POA: Diagnosis not present

## 2022-12-24 ENCOUNTER — Other Ambulatory Visit: Payer: Self-pay

## 2022-12-26 ENCOUNTER — Ambulatory Visit: Payer: Medicare Other | Admitting: Nurse Practitioner

## 2022-12-26 ENCOUNTER — Other Ambulatory Visit: Payer: Self-pay

## 2022-12-26 ENCOUNTER — Encounter: Payer: Self-pay | Admitting: Nurse Practitioner

## 2022-12-26 VITALS — BP 130/80 | HR 72 | Temp 96.4°F | Resp 16 | Ht 72.0 in | Wt 248.2 lb

## 2022-12-26 DIAGNOSIS — E1169 Type 2 diabetes mellitus with other specified complication: Secondary | ICD-10-CM | POA: Diagnosis not present

## 2022-12-26 DIAGNOSIS — Z0000 Encounter for general adult medical examination without abnormal findings: Secondary | ICD-10-CM | POA: Diagnosis not present

## 2022-12-26 DIAGNOSIS — Z23 Encounter for immunization: Secondary | ICD-10-CM | POA: Diagnosis not present

## 2022-12-26 DIAGNOSIS — J449 Chronic obstructive pulmonary disease, unspecified: Secondary | ICD-10-CM

## 2022-12-26 DIAGNOSIS — I700 Atherosclerosis of aorta: Secondary | ICD-10-CM | POA: Diagnosis not present

## 2022-12-26 MED ORDER — SPIRIVA RESPIMAT 2.5 MCG/ACT IN AERS: 2.0000 | INHALATION_SPRAY | Freq: Every day | RESPIRATORY_TRACT | 3 refills | Status: AC

## 2022-12-26 MED ORDER — ATORVASTATIN CALCIUM 10 MG PO TABS
ORAL_TABLET | ORAL | 1 refills | Status: DC
Start: 2022-12-26 — End: 2023-07-11

## 2022-12-26 MED ORDER — DAPAGLIFLOZIN PROPANEDIOL 10 MG PO TABS: 10.0000 mg | ORAL_TABLET | Freq: Every day | ORAL | 5 refills | Status: DC

## 2022-12-26 MED ORDER — METOPROLOL TARTRATE 37.5 MG PO TABS: 37.5000 mg | ORAL_TABLET | Freq: Two times a day (BID) | ORAL | 11 refills | Status: DC

## 2022-12-26 MED ORDER — ALBUTEROL SULFATE HFA 108 (90 BASE) MCG/ACT IN AERS
2.0000 | INHALATION_SPRAY | RESPIRATORY_TRACT | 5 refills | Status: AC | PRN
Start: 2022-12-26 — End: ?

## 2022-12-26 MED ORDER — PANTOPRAZOLE SODIUM 40 MG PO TBEC: 40.0000 mg | DELAYED_RELEASE_TABLET | Freq: Every day | ORAL | 3 refills | Status: DC

## 2022-12-26 MED ORDER — PNEUMOCOCCAL 20-VAL CONJ VACC 0.5 ML IM SUSY: 0.5000 mL | PREFILLED_SYRINGE | Freq: Once | INTRAMUSCULAR | 0 refills | Status: AC

## 2022-12-26 MED ORDER — GLIPIZIDE ER 2.5 MG PO TB24
2.5000 mg | ORAL_TABLET | Freq: Every day | ORAL | 1 refills | Status: DC
Start: 2022-12-26 — End: 2023-03-10

## 2022-12-26 MED ORDER — FUROSEMIDE 20 MG PO TABS: 20.0000 mg | ORAL_TABLET | Freq: Every day | ORAL | 3 refills | Status: DC

## 2022-12-26 NOTE — Progress Notes (Signed)
[facility] [address] [place], [place] [address]  Internal MEDICINE  Office Visit Note  Patient Name: [name]  [id number]  [id number]  Date of Service: 12/26/2022  Chief Complaint  Patient presents with   Diabetes   Hypertension   Hyperlipidemia   Medicare Wellness    HPI Kiros presents for a medicare annual well visit.  Well-appearing 89 y.o. male with  atrial fibrillation, hypertension, aortic atherosclerosis, COPD, OSA, CKD, osteoarthritis, diabetes, and prostate cancer and currently on treatment with [name]  Eye exam: sees eye doctor in [place] every year  foot exam: done today Labs: reviewed recent labs ordered by specialist.  New or worsening pain: none  Sees urology for prostate cancer. Sugars have been slightly elevated recently per patient, has been having high carb foods for breakfast.      12/26/2022   10:56 AM 12/20/2021   10:58 AM 12/08/2020   10:54 AM  MMSE - Mini Mental State Exam  Orientation to time 5 5 5   Orientation to Place 5 5 5   Registration 3 3 3   Attention/ Calculation 5 5 5   Recall 3 3 3   Language- name 2 objects 2 2 2   Language- repeat 1 0 1  Language- follow 3 step command 3 3 3   Language- read & follow direction 1 1 1   Write a sentence 0 0 0  Copy design 1 1 0  Total score 29 28 28     Medicare Risk at Home - 12/26/22 1119     Any stairs in or around the home? No    Home free of loose throw rugs in walkways, pet beds, electrical cords, etc? No    Adequate lighting in your home to reduce risk of falls? Yes    Life alert? No    Use of a cane, walker or w/c? Yes   sometimes uses cane   Grab bars in the bathroom? Yes    Shower chair or bench in shower? No    Elevated toilet seat or a handicapped toilet? No              Functional Status Survey: Is the patient deaf or have difficulty hearing?: Yes Does the patient have difficulty seeing, even when wearing glasses/contacts?: No Does the patient have  difficulty concentrating, remembering, or making decisions?: No Does the patient have difficulty walking or climbing stairs?: No Does the patient have difficulty dressing or bathing?: No Does the patient have difficulty doing errands alone such as visiting a doctor's office or shopping?: No     11/13/2021   10:11 AM 11/30/2021   10:38 AM 12/20/2021   10:56 AM 03/05/2022   10:12 AM 12/26/2022   10:54 AM  Fall Risk  Falls in the past year?   0 0 0  Was there an injury with Fall?   0  0  Fall Risk Category Calculator   0  0  Fall Risk Category (Retired)   Low    (RETIRED) Patient Fall Risk Level Low fall risk Low fall risk Low fall risk    Patient at Risk for Falls Due to   No Fall Risks  No Fall Risks  Fall risk Follow up   Falls evaluation completed  Falls evaluation completed       12/26/2022   10:55 AM  Depression screen PHQ 2/9  Decreased Interest 0  Down, Depressed, Hopeless 0  PHQ - 2 Score 0       12/28/2018   11:35  AM 11/24/2017   10:25 AM  GAD 7 : Generalized Anxiety Score  Nervous, Anxious, on Edge 0 0  Control/stop worrying 0 0  Worry too much - different things 0 0  Trouble relaxing 0 0  Restless 0 0  Easily annoyed or irritable 0 0  Afraid - awful might happen 0 0  Total GAD 7 Score 0 0  Anxiety Difficulty Not difficult at all Not difficult at all      Current Medication: Outpatient Encounter Medications as of 12/26/2022  Medication Sig   edoxaban (SAVAYSA) 30 MG TABS tablet Take 30 mg by mouth daily.   Ensifentrine (OHTUVAYRE) 3 MG/2.5ML SUSP Inhale 3 mg into the lungs in the morning and at bedtime.   enzalutamide (XTANDI) 40 MG tablet Take 4 tablets (160 mg total) by mouth daily.   pneumococcal 20-valent conjugate vaccine (PREVNAR 20) 0.5 ML injection Inject 0.5 mLs into the muscle once for 1 dose.   vitamin B-12 (CYANOCOBALAMIN) 1000 MCG tablet Take 1 tablet (1,000 mcg total) by mouth daily.   [DISCONTINUED] albuterol (VENTOLIN HFA) 108 (90 Base)  MCG/ACT inhaler Inhale 2 puffs into the lungs every 4 (four) hours as needed for wheezing or shortness of breath.   [DISCONTINUED] atorvastatin (LIPITOR) 10 MG tablet TAKE 1 TABLET(10 MG) BY MOUTH DAILY   [DISCONTINUED] dapagliflozin propanediol (FARXIGA) 10 MG TABS tablet Take 1 tablet (10 mg total) by mouth daily.   [DISCONTINUED] glipiZIDE (GLUCOTROL XL) 2.5 MG 24 hr tablet Take 1 tablet (2.5 mg total) by mouth daily with breakfast.   [DISCONTINUED] metoprolol tartrate 37.5 MG TABS Take 37.5 mg by mouth 2 (two) times daily.   [DISCONTINUED] pantoprazole (PROTONIX) 40 MG tablet Take 1 tablet (40 mg total) by mouth daily.   [DISCONTINUED] SPIRIVA RESPIMAT 2.5 MCG/ACT AERS INHALE 2 PUFFS INTO THE LUNGS DAILY   albuterol (VENTOLIN HFA) 108 (90 Base) MCG/ACT inhaler Inhale 2 puffs into the lungs every 4 (four) hours as needed for wheezing or shortness of breath.   atorvastatin (LIPITOR) 10 MG tablet TAKE 1 TABLET(10 MG) BY MOUTH DAILY   dapagliflozin propanediol (FARXIGA) 10 MG TABS tablet Take 1 tablet (10 mg total) by mouth daily.   furosemide (LASIX) 20 MG tablet Take 1 tablet (20 mg total) by mouth daily.   glipiZIDE (GLUCOTROL XL) 2.5 MG 24 hr tablet Take 1 tablet (2.5 mg total) by mouth daily with breakfast.   Metoprolol Tartrate 37.5 MG TABS Take 1 tablet (37.5 mg total) by mouth 2 (two) times daily.   pantoprazole (PROTONIX) 40 MG tablet Take 1 tablet (40 mg total) by mouth daily.   Tiotropium Bromide Monohydrate (SPIRIVA RESPIMAT) 2.5 MCG/ACT AERS Inhale 2 puffs into the lungs daily.   [DISCONTINUED] furosemide (LASIX) 20 MG tablet Take 1 tablet (20 mg total) by mouth daily.   No facility-administered encounter medications on file as of 12/26/2022.    Surgical History: Past Surgical History:  Procedure Laterality Date   CATARACT EXTRACTION     COLONOSCOPY WITH PROPOFOL N/A 07/02/2019   Procedure: COLONOSCOPY WITH PROPOFOL;  Surgeon: Toney Reil, MD;  Location: [facility] ENDOSCOPY;   Service: Gastroenterology;  Laterality: N/A;   ESOPHAGOGASTRODUODENOSCOPY (EGD) WITH PROPOFOL N/A 03/19/2022   Procedure: ESOPHAGOGASTRODUODENOSCOPY (EGD) WITH PROPOFOL;  Surgeon: Wyline Mood, MD;  Location: [facility] ENDOSCOPY;  Service: Gastroenterology;  Laterality: N/A;   IR CT HEAD LTD  08/27/2019   IR PERCUTANEOUS ART THROMBECTOMY/INFUSION INTRACRANIAL INC DIAG ANGIO  08/27/2019       IR PERCUTANEOUS ART THROMBECTOMY/INFUSION INTRACRANIAL  INC DIAG ANGIO  08/27/2019   LOWER EXTREMITY ANGIOGRAPHY Left 12/25/2020   Procedure: Lower Extremity Angiography;  Surgeon: Annice Needy, MD;  Location: [facility] INVASIVE CV LAB;  Service: Cardiovascular;  Laterality: Left;   PACEMAKER LEADLESS INSERTION N/A 12/19/2020   Procedure: PACEMAKER LEADLESS INSERTION;  Surgeon: Marcina Millard, MD;  Location: [facility] INVASIVE CV LAB;  Service: Cardiovascular;  Laterality: N/A;   PROSTATE CANCER     PROSTATE SURGERY     RADIOLOGY WITH ANESTHESIA N/A 08/27/2019   Procedure: IR WITH ANESTHESIA;  Surgeon: Julieanne Cotton, MD;  Location: [facility] OR;  Service: Radiology;  Laterality: N/A;    Medical History: Past Medical History:  Diagnosis Date   Acute deep vein thrombosis (DVT) of femoral vein of left lower extremity (HCC)    Atrial fibrillation (HCC)    CHF (congestive heart failure) (HCC)    Chronic kidney disease    Diabetes (HCC)    Hearing loss    High blood pressure    History of bladder problems    Hyperlipidemia    Kidney stones 11/22/2014   Liver cyst    OSA (obstructive sleep apnea)    Prostate cancer (HCC)    Prostate cancer (HCC)    Stroke (HCC)    Valvular heart disease     Family History: Family History  Problem Relation Age of Onset   Colon cancer Mother    Diabetes Other    High blood pressure Other    Prostate cancer Brother    Diabetes Brother     Social History   Socioeconomic History   Marital status: Married    Spouse name: Kaesen Shellenbarger   Number of children: 2   Years of  education: 12   Highest education level: 12th grade  Occupational History   Occupation: Airport  Tobacco Use   Smoking status: Former    Types: Cigarettes   Smokeless tobacco: Never  Vaping Use   Vaping status: Never Used  Substance and Sexual Activity   Alcohol use: No   Drug use: No   Sexual activity: Not Currently  Other Topics Concern   Not on file  Social History Narrative   Not on file   Social Determinants of Health   Financial Resource Strain: Low Risk  (10/29/2019)   Overall Financial Resource Strain (CARDIA)    Difficulty of Paying Living Expenses: Not very hard  Recent Concern: Financial Resource Strain - Medium Risk (09/08/2019)   Overall Financial Resource Strain (CARDIA)    Difficulty of Paying Living Expenses: Somewhat hard  Food Insecurity: No Food Insecurity (09/27/2022)   Hunger Vital Sign    Worried About Running Out of Food in the Last Year: Never true    Ran Out of Food in the Last Year: Never true  Transportation Needs: No Transportation Needs (09/27/2022)   PRAPARE - Administrator, Civil Service (Medical): No    Lack of Transportation (Non-Medical): No  Physical Activity: Inactive (10/29/2019)   Exercise Vital Sign    Days of Exercise per Week: 0 days    Minutes of Exercise per Session: 0 min  Stress: No Stress Concern Present (10/29/2019)   [employer] of Occupational Health - Occupational Stress Questionnaire    Feeling of Stress : Only a little  Recent Concern: Stress - Stress Concern Present (09/08/2019)   [employer] of Occupational Health - Occupational Stress Questionnaire    Feeling of Stress : To some extent  Social Connections: Moderately Integrated (10/29/2019)  Social Advertising account executive [NHANES]    Frequency of Communication with Friends and Family: More than three times a week    Frequency of Social Gatherings with Friends and Family: More than three times a week    Attends Religious Services: More than 4  times per year    Active Member of [employer] or Organizations: No    Attends Banker Meetings: Never    Marital Status: Married  Catering manager Violence: Not At Risk (09/22/2022)   Humiliation, Afraid, Rape, and Kick questionnaire    Fear of Current or Ex-Partner: No    Emotionally Abused: No    Physically Abused: No    Sexually Abused: No      Review of Systems  Constitutional:  Positive for fatigue. Negative for activity change, chills and unexpected weight change.       Activity levels are improving since his most recent visit.   HENT:  Negative for congestion, postnasal drip, rhinorrhea, sneezing and sore throat.   Respiratory:  Negative for cough, chest tightness, shortness of breath and wheezing.   Cardiovascular:  Negative for chest pain and palpitations.  Gastrointestinal:  Negative for abdominal distention, abdominal pain, constipation, diarrhea, nausea and vomiting.  Endocrine: Negative for cold intolerance, heat intolerance, polydipsia and polyuria.       Improved blood sugars .  Genitourinary:  Negative for dysuria, hematuria and urgency.  Musculoskeletal:  Negative for arthralgias, back pain, joint swelling and neck pain.  Skin:  Negative for rash.  Allergic/Immunologic: Negative for environmental allergies.  Neurological:  Negative for dizziness, tremors, weakness, numbness and headaches.  Hematological:  Negative for adenopathy. Does not bruise/bleed easily.  Psychiatric/Behavioral:  Negative for behavioral problems (Depression), sleep disturbance and suicidal ideas. The patient is not nervous/anxious.     Vital Signs: BP 130/80   Pulse 72   Temp (!) 96.4 F (35.8 C)   Resp 16   Ht 6' (1.829 m)   Wt 248 lb 3.2 oz (112.6 kg)   SpO2 99%   BMI 33.66 kg/m    Physical Exam Vitals and nursing note reviewed.  Constitutional:      General: He is not in acute distress.    Appearance: Normal appearance. He is well-developed. He is not diaphoretic.   HENT:     Head: Normocephalic and atraumatic.     Nose: Nose normal.     Mouth/Throat:     Pharynx: No oropharyngeal exudate.  Eyes:     Conjunctiva/sclera: Conjunctivae normal.     Pupils: Pupils are equal, round, and reactive to light.  Neck:     Thyroid: No thyromegaly.     Vascular: No carotid bruit or JVD.     Trachea: No tracheal deviation.  Cardiovascular:     Rate and Rhythm: Normal rate. Rhythm irregular.     Pulses:          Dorsalis pedis pulses are 1+ on the right side and 1+ on the left side.       Posterior tibial pulses are 1+ on the right side and 1+ on the left side.     Heart sounds: Murmur heard.     No friction rub. No gallop.     Comments: Irregular heart rhythm with soft, blowing murmur. Pulmonary:     Effort: Pulmonary effort is normal. No respiratory distress.     Breath sounds: Normal breath sounds. No wheezing or rales.  Chest:     Chest wall: No tenderness.  Abdominal:  General: Bowel sounds are normal.     Palpations: Abdomen is soft.     Tenderness: There is no abdominal tenderness.  Musculoskeletal:        General: Normal range of motion.     Cervical back: Normal range of motion and neck supple.     Right foot: Normal range of motion. No deformity, bunion, Charcot foot, foot drop or prominent metatarsal heads.     Left foot: Normal range of motion. No deformity, bunion, Charcot foot, foot drop or prominent metatarsal heads.  Feet:     Right foot:     Protective Sensation: 6 sites tested.  6 sites sensed.     Skin integrity: Dry skin (so dry they are peeling on the bottom possible tinea pedis.) present. No ulcer, blister, skin breakdown, erythema, warmth, callus or fissure.     Toenail Condition: Right toenails are abnormally thick, long and ingrown. Fungal disease present.    Left foot:     Protective Sensation: 6 sites tested.  6 sites sensed.     Skin integrity: Dry skin (so dry they are peeling on the bottom, possible tinea pedis)  present. No ulcer, blister, skin breakdown, erythema, warmth, callus or fissure.     Toenail Condition: Left toenails are abnormally thick, long and ingrown. Fungal disease present. Lymphadenopathy:     Cervical: No cervical adenopathy.  Skin:    General: Skin is warm and dry.     Capillary Refill: Capillary refill takes less than 2 seconds.  Neurological:     Mental Status: He is alert and oriented to person, place, and time. Mental status is at baseline.     Cranial Nerves: No cranial nerve deficit.     Sensory: No sensory deficit.  Psychiatric:        Mood and Affect: Mood normal.        Behavior: Behavior normal.        Thought Content: Thought content normal.        Judgment: Judgment normal.        Assessment/Plan: 1. Encounter for subsequent annual wellness visit (AWV) in Medicare patient Age-appropriate preventive screenings and vaccinations discussed, annual physical exam completed. Routine labs for health maintenance labs done recently, results reviewed. Routine refills ordered. PHM updated.   - furosemide (LASIX) 20 MG tablet; Take 1 tablet (20 mg total) by mouth daily.  Dispense: 90 tablet; Refill: 3 - pantoprazole (PROTONIX) 40 MG tablet; Take 1 tablet (40 mg total) by mouth daily.  Dispense: 90 tablet; Refill: 3 - Metoprolol Tartrate 37.5 MG TABS; Take 1 tablet (37.5 mg total) by mouth 2 (two) times daily.  Dispense: 60 tablet; Refill: 11  2. Type 2 diabetes mellitus with other specified complication, without long-term current use of insulin (HCC) associated with aortic atherosclerosis and hyperlipidemia. Decrease high carb foods and eat more protein rich breakfast. Continue farxiga and glipizide as prescribed.  - dapagliflozin propanediol (FARXIGA) 10 MG TABS tablet; Take 1 tablet (10 mg total) by mouth daily.  Dispense: 30 tablet; Refill: 5 - glipiZIDE (GLUCOTROL XL) 2.5 MG 24 hr tablet; Take 1 tablet (2.5 mg total) by mouth daily with breakfast.  Dispense: 90 tablet;  Refill: 1  3. Atherosclerosis of aorta (HCC) Continue atorvastatin as prescribed.  - atorvastatin (LIPITOR) 10 MG tablet; TAKE 1 TABLET(10 MG) BY MOUTH DAILY  Dispense: 90 tablet; Refill: 1  4. Chronic obstructive pulmonary disease, unspecified COPD type (HCC) Continue albuterol inhaler as needed and continue spiriva as prescribed.  -  albuterol (VENTOLIN HFA) 108 (90 Base) MCG/ACT inhaler; Inhale 2 puffs into the lungs every 4 (four) hours as needed for wheezing or shortness of breath.  Dispense: 18 g; Refill: 5 - Tiotropium Bromide Monohydrate (SPIRIVA RESPIMAT) 2.5 MCG/ACT AERS; Inhale 2 puffs into the lungs daily.  Dispense: 4 g; Refill: 3  5. Need for vaccination - pneumococcal 20-valent conjugate vaccine (PREVNAR 20) 0.5 ML injection; Inject 0.5 mLs into the muscle once for 1 dose.  Dispense: 0.5 mL; Refill: 0     General Counseling: Chrishaun verbalizes understanding of the findings of todays visit and agrees with plan of treatment. I have discussed any further diagnostic evaluation that may be needed or ordered today. We also reviewed his medications today. he has been encouraged to call the office with any questions or concerns that should arise related to todays visit.    No orders of the defined types were placed in this encounter.   Meds ordered this encounter  Medications   pneumococcal 20-valent conjugate vaccine (PREVNAR 20) 0.5 ML injection    Sig: Inject 0.5 mLs into the muscle once for 1 dose.    Dispense:  0.5 mL    Refill:  0   albuterol (VENTOLIN HFA) 108 (90 Base) MCG/ACT inhaler    Sig: Inhale 2 puffs into the lungs every 4 (four) hours as needed for wheezing or shortness of breath.    Dispense:  18 g    Refill:  5   atorvastatin (LIPITOR) 10 MG tablet    Sig: TAKE 1 TABLET(10 MG) BY MOUTH DAILY    Dispense:  90 tablet    Refill:  1   dapagliflozin propanediol (FARXIGA) 10 MG TABS tablet    Sig: Take 1 tablet (10 mg total) by mouth daily.    Dispense:  30  tablet    Refill:  5   furosemide (LASIX) 20 MG tablet    Sig: Take 1 tablet (20 mg total) by mouth daily.    Dispense:  90 tablet    Refill:  3    For future refills   Tiotropium Bromide Monohydrate (SPIRIVA RESPIMAT) 2.5 MCG/ACT AERS    Sig: Inhale 2 puffs into the lungs daily.    Dispense:  4 g    Refill:  3   pantoprazole (PROTONIX) 40 MG tablet    Sig: Take 1 tablet (40 mg total) by mouth daily.    Dispense:  90 tablet    Refill:  3   Metoprolol Tartrate 37.5 MG TABS    Sig: Take 1 tablet (37.5 mg total) by mouth 2 (two) times daily.    Dispense:  60 tablet    Refill:  11   glipiZIDE (GLUCOTROL XL) 2.5 MG 24 hr tablet    Sig: Take 1 tablet (2.5 mg total) by mouth daily with breakfast.    Dispense:  90 tablet    Refill:  1    Fill new script asap today thanks. Dx code E11.65    Return in about 2 months (around 02/26/2023) for F/U, Recheck A1C, Maliek Schellhorn PCP.   Total time spent:30 Minutes Time spent includes review of chart, medications, test results, and follow up plan with the patient.   [place] Controlled Substance Database was reviewed by me.  This patient was seen by [name], FNP-C in collaboration with Dr. Beverely Risen as a part of collaborative care agreement.  Tyronn Golda R. Tedd Sias, MSN, FNP-C Internal medicine

## 2022-12-26 NOTE — Progress Notes (Signed)
Specialty Pharmacy Refill Coordination Note  [name] is a 89 y.o. male contacted today regarding refills of specialty medication(s) Enzalutamide   Patient requested Delivery   Delivery date: 01/09/23   Verified address: [address] [place] [address]   Medication will be filled on 01/08/23.

## 2022-12-27 ENCOUNTER — Ambulatory Visit: Payer: Self-pay

## 2022-12-27 NOTE — Patient Instructions (Signed)
Visit Information  Thank you for taking time to visit with me today. Please don't hesitate to contact me if I can be of assistance to you.   Following are the goals we discussed today:   Goals Addressed             This Visit's Progress    improvement post hospitalization and management of health conditions.       Interventions Today    Flowsheet Row Most Recent Value  Chronic Disease   Chronic disease during today's visit Chronic Obstructive Pulmonary Disease (COPD), Chronic Kidney Disease/End Stage Renal Disease (ESRD)  General Interventions   General Interventions Discussed/Reviewed General Interventions Reviewed, Doctor Visits, Labs  [evaluation of current treatment plan related to COPD/ CKD and patients adherence to plan as established by provider.  Assessed for increase in COPD symptoms or any new/ ongoing symptoms.]  Labs Kidney Function  Doctor Visits Discussed/Reviewed Doctor Visits Reviewed  Annabell Sabal upcoming provider visits.]  Education Interventions   Education Provided Provided Education  [Reviewed signs/ symptoms of COPD. Advised to notify provider of mild/ moderate increase in symptoms and call 911 for severe symptoms.  Discussed kidney health and ways to maintain kidney health.]  Pharmacy Interventions   Pharmacy Dicussed/Reviewed Pharmacy Topics Reviewed  [medications were not discussed due to assessment being completed with spouse.  Will review medications with patient at next telephone outreach.]              Our next appointment is by telephone on 01/31/23 at 11 am  Please call the care guide team at [phone] if you need to cancel or reschedule your appointment.   If you are experiencing a Mental Health or Behavioral Health Crisis or need someone to talk to, please call the Suicide and Crisis Lifeline: 988 call 1-800-273-TALK (toll free, 24 hour hotline)  The patient verbalized understanding of instructions, educational materials, and care plan  provided today and agreed to receive a mailed copy of patient instructions, educational materials, and care plan.   George Ina RN,BSN,CCM [place]  [facility], [facility] coordinator / Case Manager Phone: [phone]

## 2022-12-27 NOTE — Patient Outreach (Signed)
  Care Coordination   Follow Up Visit Note   12/27/2022 Name: [name] MRN: [id number] DOB: 1933/09/05  [name] is a 89 y.o. year old male who sees Glenn Kuster, NP for primary care. I spoke with  spouse/ designated party release, [name] by phone today.  What matters to the patients health and wellness today?  Wife states patient is at work today. Wife states patient appears to being doing well and doesn't have any specific complaints. Wife states patient is not exhibiting any signs of COPD flare up.  Wife states she received call today from provider office regarding patients kidneys and lab work. She states she is not really sure what it was about. She states she will have her husband follow up on this.  Per chart review patient is scheduled for a follow up visit with Dr. Thedore Mins nephrologist on 03/05/23.  Wife reminded of this appointment.     Goals Addressed             This Visit's Progress    improvement post hospitalization and management of health conditions.       Interventions Today    Flowsheet Row Most Recent Value  Chronic Disease   Chronic disease during today's visit Chronic Obstructive Pulmonary Disease (COPD), Chronic Kidney Disease/End Stage Renal Disease (ESRD)  General Interventions   General Interventions Discussed/Reviewed General Interventions Reviewed, Doctor Visits, Labs  [evaluation of current treatment plan related to COPD/ CKD and patients adherence to plan as established by provider.  Assessed for increase in COPD symptoms or any new/ ongoing symptoms.]  Labs Kidney Function  Doctor Visits Discussed/Reviewed Doctor Visits Reviewed  Annabell Sabal upcoming provider visits.]  Education Interventions   Education Provided Provided Education  [Reviewed signs/ symptoms of COPD. Advised to notify provider of mild/ moderate increase in symptoms and call 911 for severe symptoms.  Discussed kidney health and ways to maintain kidney health.]   Pharmacy Interventions   Pharmacy Dicussed/Reviewed Pharmacy Topics Reviewed  [medications were not discussed due to assessment being completed with spouse.  Will review medications with patient at next telephone outreach.]              SDOH assessments and interventions completed:  No     Care Coordination Interventions:  Yes, provided   Follow up plan: Follow up call scheduled for 01/31/23    Encounter Outcome:  Patient Visit Completed   George Ina RN,BSN,CCM [facility]  [facility], [facility] coordinator / Case Manager Phone: [phone]

## 2022-12-27 NOTE — Addendum Note (Signed)
Addended by: Annamaria Helling on: 12/27/2022 10:08 AM   Modules accepted: Orders

## 2023-01-08 ENCOUNTER — Other Ambulatory Visit: Payer: Self-pay

## 2023-01-10 ENCOUNTER — Encounter: Payer: Self-pay | Admitting: Podiatry

## 2023-01-10 ENCOUNTER — Ambulatory Visit (INDEPENDENT_AMBULATORY_CARE_PROVIDER_SITE_OTHER): Payer: Medicare Other | Admitting: Podiatry

## 2023-01-10 DIAGNOSIS — M79674 Pain in right toe(s): Secondary | ICD-10-CM

## 2023-01-10 DIAGNOSIS — M79675 Pain in left toe(s): Secondary | ICD-10-CM

## 2023-01-10 DIAGNOSIS — E0843 Diabetes mellitus due to underlying condition with diabetic autonomic (poly)neuropathy: Secondary | ICD-10-CM

## 2023-01-10 DIAGNOSIS — B351 Tinea unguium: Secondary | ICD-10-CM

## 2023-01-19 NOTE — Progress Notes (Signed)
°  Subjective:  Patient ID: [name], male    DOB: Jan 28, 1933,  MRN: [id number]  89 y.o. male presents at risk foot care with history of diabetic neuropathy and painful thick toenails that are difficult to trim. Pain interferes with ambulation. Aggravating factors include wearing enclosed shoe gear. Pain is relieved with periodic professional debridement.  New problem(s): None   PCP is [name], NP , and last visit was December 26, 2022.  No Known Allergies  Review of Systems: Negative except as noted in the HPI.   Objective:  [name] is a pleasant 89 y.o. male in NAD. AAO x 3.  Vascular Examination: Vascular status intact b/l with palpable pedal pulses. CFT immediate b/l. Pedal hair present. No edema. No pain with calf compression b/l. Skin temperature gradient WNL b/l. No varicosities noted. No cyanosis or clubbing noted.  Neurological Examination: Pt has subjective symptoms of neuropathy. Sensation grossly intact b/l with 10 gram monofilament. Vibratory sensation intact b/l.  Dermatological Examination: Pedal skin with normal turgor, texture and tone b/l. No open wounds nor interdigital macerations noted. Toenails 1-5 b/l thick, discolored, elongated with subungual debris and pain on dorsal palpation. No hyperkeratotic lesions noted b/l.   Musculoskeletal Examination: Muscle strength 5/5 to b/l LE.  No pain, crepitus noted b/l. No gross pedal deformities. Patient ambulates independently without assistive aids.   Radiographs: None  Last A1c:      Latest Ref Rng & Units 10/04/2022   10:45 AM 05/29/2022    2:24 PM 03/29/2022    9:41 AM  Hemoglobin A1C  Hemoglobin-A1c 4.0 - 5.6 % 7.5  7.6  8.2      Assessment:   1. Pain due to onychomycosis of toenails of both feet   2. Diabetes mellitus due to underlying condition with diabetic autonomic neuropathy, unspecified whether long term insulin use (HCC)    Plan:  Patient was evaluated and treated. All  patient's and/or POA's questions/concerns addressed on today's visit. Toenails 1-5 debrided in length and girth without incident. Continue soft, supportive shoe gear daily. Report any pedal injuries to medical professional. Call office if there are any questions/concerns. -Patient/POA to call should there be question/concern in the interim.  Return in about 3 months (around 04/10/2023).  Freddie Breech, DPM      [facility] LOCATION: [address] [place] [address]                   Office [phone]   [facility] LOCATION: [address] [place], [place] [address] Office [phone]

## 2023-01-20 ENCOUNTER — Emergency Department
Admission: EM | Admit: 2023-01-20 | Discharge: 2023-01-20 | Disposition: A | Discharge status: Home / Self Care | Payer: Medicare Other | Attending: Emergency Medicine | Admitting: Emergency Medicine

## 2023-01-20 ENCOUNTER — Emergency Department: Payer: Medicare Other

## 2023-01-20 ENCOUNTER — Other Ambulatory Visit: Payer: Self-pay

## 2023-01-20 DIAGNOSIS — K5900 Constipation, unspecified: Secondary | ICD-10-CM | POA: Diagnosis not present

## 2023-01-20 DIAGNOSIS — Z8546 Personal history of malignant neoplasm of prostate: Secondary | ICD-10-CM | POA: Diagnosis not present

## 2023-01-20 DIAGNOSIS — Z743 Need for continuous supervision: Secondary | ICD-10-CM | POA: Diagnosis not present

## 2023-01-20 DIAGNOSIS — E119 Type 2 diabetes mellitus without complications: Secondary | ICD-10-CM | POA: Insufficient documentation

## 2023-01-20 DIAGNOSIS — R1084 Generalized abdominal pain: Secondary | ICD-10-CM | POA: Diagnosis not present

## 2023-01-20 DIAGNOSIS — I4891 Unspecified atrial fibrillation: Secondary | ICD-10-CM | POA: Diagnosis not present

## 2023-01-20 DIAGNOSIS — R109 Unspecified abdominal pain: Secondary | ICD-10-CM | POA: Diagnosis not present

## 2023-01-20 DIAGNOSIS — I10 Essential (primary) hypertension: Secondary | ICD-10-CM | POA: Diagnosis not present

## 2023-01-20 DIAGNOSIS — G4733 Obstructive sleep apnea (adult) (pediatric): Secondary | ICD-10-CM | POA: Diagnosis not present

## 2023-01-20 NOTE — ED Triage Notes (Signed)
PT in with co rectal pain states no BM since yesterday and states he is constipated.

## 2023-01-20 NOTE — ED Provider Notes (Signed)
[facility] Provider Note    Event Date/Time   First MD Initiated Contact with Patient 01/20/23 1221     (approximate)   History   Constipation   HPI  [name] is a 89 y.o. male history of diabetes, high blood pressure, A-fib, prostate cancer, CVA presents emergency department with complaints of constipation.  Patient's not have a bowel movement since yesterday.  States when he did it was just small balls.  States no abdominal pain just feels uncomfortable because he is bloated.      Physical Exam   Triage Vital Signs: ED Triage Vitals [01/20/23 0912]  Encounter Vitals Group     BP 139/74     Systolic BP Percentile      Diastolic BP Percentile      Pulse Rate 94     Resp 18     Temp 98.8 F (37.1 C)     Temp Source Oral     SpO2 98 %     Weight 248 lb (112.5 kg)     Height 6' (1.829 m)     Head Circumference      Peak Flow      Pain Score 8     Pain Loc      Pain Education      Exclude from Growth Chart     Most recent vital signs: Vitals:   01/20/23 0912  BP: 139/74  Pulse: 94  Resp: 18  Temp: 98.8 F (37.1 C)  SpO2: 98%     General: Awake, no distress.   CV:  Good peripheral perfusion. regular rate and  rhythm Resp:  Normal effort. Lungs cta Abd:  No distention.  Bowel sounds present, nontender Other:      ED Results / Procedures / Treatments   Labs (all labs ordered are listed, but only abnormal results are displayed) Labs Reviewed  CBC WITH DIFFERENTIAL/PLATELET  COMPREHENSIVE METABOLIC PANEL  LIPASE, BLOOD  URINALYSIS, ROUTINE W REFLEX MICROSCOPIC     EKG     RADIOLOGY During the abdomen    PROCEDURES:   Procedures   MEDICATIONS ORDERED IN ED: Medications - No data to display   IMPRESSION / MDM / ASSESSMENT AND PLAN / ED COURSE  I reviewed the triage vital signs and the nursing notes.                              Differential diagnosis includes, but is not limited to, bowel  obstruction, constipation, fecal impaction  Patient's presentation is most consistent with acute illness / injury with system symptoms.   X-ray of the abdomen 1 view shows no obstructive pattern and moderate stool burden  Abdomen ordered from triage, however the patient came to the desk and wants to go home.  Did pulling back to examine him and give him the test results.  States he feels better as he has had 2 bowel movements while here waiting in the ED.  Feel that it is okay for him to defer his lab work at this time and he was discharged in stable condition.      FINAL CLINICAL IMPRESSION(S) / ED DIAGNOSES   Final diagnoses:  Constipation, unspecified constipation type     Rx / DC Orders   ED Discharge Orders     None        Note:  This document was prepared using Dragon voice recognition software and may  include unintentional dictation errors.    Faythe Ghee, PA-C 01/20/23 1233    Jene Every, MD 01/20/23 [phone]

## 2023-01-20 NOTE — ED Triage Notes (Signed)
Pt comes via EMs from home with c/o belly pain. Pt has not been able to have BM since last night. Pt states hx of this. VSS pt did take some miralax wit no improvement.

## 2023-01-20 NOTE — Discharge Instructions (Signed)
Miralax everyday

## 2023-01-23 ENCOUNTER — Inpatient Hospital Stay: Payer: Medicare Other | Attending: Oncology

## 2023-01-23 DIAGNOSIS — C772 Secondary and unspecified malignant neoplasm of intra-abdominal lymph nodes: Secondary | ICD-10-CM | POA: Insufficient documentation

## 2023-01-23 DIAGNOSIS — Z79899 Other long term (current) drug therapy: Secondary | ICD-10-CM | POA: Insufficient documentation

## 2023-01-23 DIAGNOSIS — C61 Malignant neoplasm of prostate: Secondary | ICD-10-CM | POA: Insufficient documentation

## 2023-01-23 DIAGNOSIS — E1122 Type 2 diabetes mellitus with diabetic chronic kidney disease: Secondary | ICD-10-CM | POA: Insufficient documentation

## 2023-01-23 DIAGNOSIS — I480 Paroxysmal atrial fibrillation: Secondary | ICD-10-CM | POA: Insufficient documentation

## 2023-01-23 DIAGNOSIS — N1832 Chronic kidney disease, stage 3b: Secondary | ICD-10-CM | POA: Insufficient documentation

## 2023-01-23 DIAGNOSIS — Z7984 Long term (current) use of oral hypoglycemic drugs: Secondary | ICD-10-CM | POA: Insufficient documentation

## 2023-01-23 DIAGNOSIS — Z87891 Personal history of nicotine dependence: Secondary | ICD-10-CM | POA: Insufficient documentation

## 2023-01-23 DIAGNOSIS — Z7901 Long term (current) use of anticoagulants: Secondary | ICD-10-CM | POA: Insufficient documentation

## 2023-01-27 ENCOUNTER — Other Ambulatory Visit: Payer: Self-pay | Admitting: Nurse Practitioner

## 2023-01-27 DIAGNOSIS — N1832 Chronic kidney disease, stage 3b: Secondary | ICD-10-CM

## 2023-01-28 ENCOUNTER — Encounter: Payer: Self-pay | Admitting: Oncology

## 2023-01-28 ENCOUNTER — Inpatient Hospital Stay: Payer: Medicare Other

## 2023-01-28 ENCOUNTER — Inpatient Hospital Stay (HOSPITAL_BASED_OUTPATIENT_CLINIC_OR_DEPARTMENT_OTHER): Payer: Medicare Other | Admitting: Oncology

## 2023-01-28 VITALS — BP 123/80 | HR 84 | Temp 98.1°F | Wt 245.5 lb

## 2023-01-28 DIAGNOSIS — E1122 Type 2 diabetes mellitus with diabetic chronic kidney disease: Secondary | ICD-10-CM | POA: Diagnosis not present

## 2023-01-28 DIAGNOSIS — N1832 Chronic kidney disease, stage 3b: Secondary | ICD-10-CM

## 2023-01-28 DIAGNOSIS — IMO0001 Reserved for inherently not codable concepts without codable children: Secondary | ICD-10-CM

## 2023-01-28 DIAGNOSIS — C61 Malignant neoplasm of prostate: Secondary | ICD-10-CM

## 2023-01-28 DIAGNOSIS — Z79818 Long term (current) use of other agents affecting estrogen receptors and estrogen levels: Secondary | ICD-10-CM

## 2023-01-28 DIAGNOSIS — I480 Paroxysmal atrial fibrillation: Secondary | ICD-10-CM | POA: Diagnosis not present

## 2023-01-28 DIAGNOSIS — Z7984 Long term (current) use of oral hypoglycemic drugs: Secondary | ICD-10-CM | POA: Diagnosis not present

## 2023-01-28 DIAGNOSIS — Z7901 Long term (current) use of anticoagulants: Secondary | ICD-10-CM | POA: Diagnosis not present

## 2023-01-28 DIAGNOSIS — Z87891 Personal history of nicotine dependence: Secondary | ICD-10-CM | POA: Diagnosis not present

## 2023-01-28 DIAGNOSIS — C772 Secondary and unspecified malignant neoplasm of intra-abdominal lymph nodes: Secondary | ICD-10-CM | POA: Diagnosis not present

## 2023-01-28 DIAGNOSIS — Z79899 Other long term (current) drug therapy: Secondary | ICD-10-CM | POA: Diagnosis not present

## 2023-01-28 LAB — CBC WITH DIFFERENTIAL (CANCER CENTER ONLY)
Abs Immature Granulocytes: 0.02 10*3/uL (ref 0.00–0.07)
Basophils Absolute: 0 10*3/uL (ref 0.0–0.1)
Basophils Relative: 1 %
Eosinophils Absolute: 0.1 10*3/uL (ref 0.0–0.5)
Eosinophils Relative: 2 %
HCT: 49.3 % (ref 39.0–52.0)
Hemoglobin: 15.9 g/dL (ref 13.0–17.0)
Immature Granulocytes: 0 %
Lymphocytes Relative: 35 %
Lymphs Abs: 2.1 10*3/uL (ref 0.7–4.0)
MCH: 30.7 pg (ref 26.0–34.0)
MCHC: 32.3 g/dL (ref 30.0–36.0)
MCV: 95.2 fL (ref 80.0–100.0)
Monocytes Absolute: 0.9 10*3/uL (ref 0.1–1.0)
Monocytes Relative: 14 %
Neutro Abs: 2.9 10*3/uL (ref 1.7–7.7)
Neutrophils Relative %: 48 %
Platelet Count: 176 10*3/uL (ref 150–400)
RBC: 5.18 MIL/uL (ref 4.22–5.81)
RDW: 14 % (ref 11.5–15.5)
WBC Count: 6 10*3/uL (ref 4.0–10.5)
nRBC: 0 % (ref 0.0–0.2)

## 2023-01-28 LAB — CMP (CANCER CENTER ONLY)
ALT: 13 U/L (ref 0–44)
AST: 18 U/L (ref 15–41)
Albumin: 3.9 g/dL (ref 3.5–5.0)
Alkaline Phosphatase: 132 U/L — ABNORMAL HIGH (ref 38–126)
Anion gap: 11 (ref 5–15)
BUN: 36 mg/dL — ABNORMAL HIGH (ref 8–23)
CO2: 25 mmol/L (ref 22–32)
Calcium: 9.6 mg/dL (ref 8.9–10.3)
Chloride: 102 mmol/L (ref 98–111)
Creatinine: 2.25 mg/dL — ABNORMAL HIGH (ref 0.61–1.24)
GFR, Estimated: 28 mL/min — ABNORMAL LOW (ref >60)
Glucose, Bld: 154 mg/dL — ABNORMAL HIGH (ref 70–99)
Potassium: 4.6 mmol/L (ref 3.5–5.1)
Sodium: 138 mmol/L (ref 135–145)
Total Bilirubin: 0.7 mg/dL (ref 0.0–1.2)
Total Protein: 7.6 g/dL (ref 6.5–8.1)

## 2023-01-28 MED ORDER — LEUPROLIDE ACETATE (6 MONTH) 45 MG ~~LOC~~ KIT
45.0000 mg | PACK | Freq: Once | SUBCUTANEOUS | Status: AC
  Administered 2023-01-28: 45 mg via SUBCUTANEOUS
  Filled 2023-01-28: qty 45
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
## 2023-01-28 NOTE — Assessment & Plan Note (Signed)
Currently on Edoxaban, follow up with cardiology

## 2023-01-28 NOTE — Assessment & Plan Note (Addendum)
 Encourage oral hydration and avoid nephrotoxins. Cr is worse. Follow up with nephrology

## 2023-01-28 NOTE — Assessment & Plan Note (Signed)
Eligard 45 mg every 6 months.- Eligard today

## 2023-01-28 NOTE — Progress Notes (Signed)
 Pt here for follow up. No new concerns voiced.

## 2023-01-28 NOTE — Assessment & Plan Note (Addendum)
 Prostate cancer recurrence.  Non- regional (retroperitoneal) lymphadenopathy -M1a Labs reviewed and discussed with patient.  PSA progressively trending up to 83. 01/05/2021 Bone scan showed No convincing scintigraphic evidence of bony metastatic disease. 01/05/2021 Mild increase in size of left retroperitoneal, right common iliac and right external iliac lymph nodes compatible with progression of disease.No CT findings of osseous metastasis. Check lab cbc cmp PSA  Patient is on androgen deprivation therapy with Eligard 45 mg every 6 months-  next due July 2025 Clinically he tolerates Xtandi 160mg  daily well. Continue current regimen

## 2023-01-29 ENCOUNTER — Ambulatory Visit: Payer: Medicare Other | Admitting: Nurse Practitioner

## 2023-01-29 ENCOUNTER — Telehealth: Payer: Self-pay

## 2023-01-29 LAB — PSA: Prostatic Specific Antigen: 0.63 ng/mL (ref 0.00–4.00)

## 2023-01-29 NOTE — Telephone Encounter (Signed)
 Called and spoke to pt's wife and informed her of MD recommendation. Lab results faxed to Dr. Doristine Church office.

## 2023-01-29 NOTE — Telephone Encounter (Signed)
-----   Message from [name] sent at 01/28/2023  7:31 PM EST ----- Please let patient know that his kidney function is worse. Recommend him to keep well hydrated.  Please send a copy of his CMP to nephrologist.

## 2023-01-31 ENCOUNTER — Ambulatory Visit: Payer: Self-pay

## 2023-01-31 NOTE — Patient Outreach (Signed)
  Care Coordination   01/31/2023 Name: [name] MRN: [id number] DOB: 10-29-1933   Care Coordination Outreach Attempts:  An unsuccessful outreach was attempted for an appointment today. Unable to reach patient or leave voice message due to phone only ringing.   Follow Up Plan:  Additional outreach attempts will be made to offer the patient complex care management information and services.   Encounter Outcome:  No Answer   Care Coordination Interventions:  No, not indicated    Arvin Seip RN,BSN,CCM [facility]  [facility] coordinator / Case Manager Phone: [phone]

## 2023-02-02 ENCOUNTER — Other Ambulatory Visit: Payer: Self-pay | Admitting: Nurse Practitioner

## 2023-02-02 DIAGNOSIS — E1169 Type 2 diabetes mellitus with other specified complication: Secondary | ICD-10-CM

## 2023-02-04 ENCOUNTER — Other Ambulatory Visit: Payer: Self-pay | Admitting: Oncology

## 2023-02-04 ENCOUNTER — Other Ambulatory Visit: Payer: Self-pay

## 2023-02-04 DIAGNOSIS — C61 Malignant neoplasm of prostate: Secondary | ICD-10-CM

## 2023-02-04 MED ORDER — ENZALUTAMIDE 40 MG PO TABS
160.0000 mg | ORAL_TABLET | Freq: Every day | ORAL | 2 refills | Status: DC
  Filled 2023-02-05: qty 120, 30d supply, fill #0
  Filled 2023-03-04: qty 120, 30d supply, fill #1
  Filled 2023-04-02: qty 120, 30d supply, fill #2

## 2023-02-04 NOTE — Telephone Encounter (Signed)
 Component Ref Range & Units (hover) 7 d ago (01/28/23) 3 mo ago (10/11/22) 4 mo ago (09/22/22) 4 mo ago (09/21/22) 4 mo ago (09/15/22) 4 mo ago (09/12/22) 4 mo ago (09/11/22)  WBC Count 6.0 4.4 6.4 7.4 6.9 7.6 9.2  RBC 5.18 4.55 4.45 4.27 4.25 4.50 4.25  Hemoglobin 15.9 14.2 13.9 13.2 13.2 14.0 13.3  HCT 49.3 44.1 43.0 40.7 40.8 42.8 39.5  MCV 95.2 96.9 96.6 95.3 96.0 95.1 92.9  MCH 30.7 31.2 31.2 30.9 31.1 31.1 31.3  MCHC 32.3 32.2 32.3 32.4 32.4 32.7 33.7  RDW 14.0 15.2 14.1 13.9 13.7 14.0 14.1  Platelet Count 176 158 322 356 313 216 186  nRBC 0.0 0.0 0.0 CM 0.0 0.0 CM 0.0 CM 0.0 CM  Neutrophils Relative % 48 50  49     Neutro Abs 2.9 2.3  3.6     Lymphocytes Relative 35 26  36     Lymphs Abs 2.1 1.2  2.6     Monocytes Relative 14 17  14      Monocytes Absolute 0.9 0.7  1.0     Eosinophils Relative 2 5  1      Eosinophils Absolute 0.1 0.2  0.1     Basophils Relative 1 1  0     Basophils Absolute 0.0 0.0  0.0     Immature Granulocytes 0 1  0     Abs Immature Granulocytes 0.02 0.02 CM  0.03 CM     Comment: Performed at [facility], [address] [place], [place] [address]  Resulting Agency [facility] CLIN LAB CH CLIN LAB CH CLIN LAB CH CLIN LAB CH CLIN LAB CH CLIN LAB CH CLIN LAB        Specimen Collected: 01/28/23 13:45 Last Resulted: 01/28/23 14:12   Component Ref Range & Units (hover) 7 d ago (01/28/23) 3 mo ago (10/11/22) 4 mo ago (09/22/22) 4 mo ago (09/21/22) 4 mo ago (09/15/22) 4 mo ago (09/12/22) 4 mo ago (09/11/22)  Sodium 138 136 139 137 137 138 136  Potassium 4.6 3.7 4.5 3.7 3.8 3.7 3.6  Chloride 102 105 102 100 103 107 107  CO2 25 22 23 23 24 22 22   Glucose, Bld 154 High  151 High  CM 157 High  CM 158 High  CM 141 High  CM 142 High  CM 138 High  CM  Comment: Glucose reference range applies only to samples taken after fasting for at least 8 hours.  BUN 36 High  26 High  29 High  33 High  26 High  28 High  31 High   Creatinine 2.25 High  1.83 High  1.77 High  2.15 High   1.80 High  1.50 High  1.57 High   Calcium  9.6 8.8 Low  9.3 9.0 8.8 Low  8.6 Low  8.5 Low   Total Protein 7.6 6.7 7.9 7.5  6.7 7.0  Albumin  3.9 3.2 Low  3.2 Low  3.0 Low   2.5 Low  2.7 Low   AST 18 18 17 22  15 15   ALT 13 14 13 13  12 11   Alkaline Phosphatase 132 High  140 High  102 105  91 93  Total Bilirubin 0.7 0.7 R 0.6 R 0.7 R  1.2 R 1.2 R  GFR, Estimated 28 Low  35 Low  CM       Comment: (NOTE) Calculated using the CKD-EPI Creatinine Equation (2021)  Anion gap 11 9 CM 14 CM 14  CM 10 CM 9 CM 7 CM  Comment: Performed at [facility], [address] [place], [place] [address]  Resulting Agency [facility] CLIN LAB CH CLIN LAB CH CLIN LAB CH CLIN LAB CH CLIN LAB CH CLIN LAB [facility] CLIN LAB        Specimen Collected: 01/28/23 13:45 Last Resulted: 01/28/23 14:17

## 2023-02-04 NOTE — Progress Notes (Signed)
 Specialty Pharmacy Refill Coordination Note  DELYNN PURSLEY is a 89 y.o. male contacted today regarding refills of specialty medication(s) Enzalutamide  (XTANDI )  Spoke with patient's daughter - Tiffant  Patient requested Delivery   Delivery date: 02/10/23   Verified address: [address] [place] [address]   Medication will be filled on 01.17.25.   Refill request pending

## 2023-02-05 ENCOUNTER — Other Ambulatory Visit (HOSPITAL_COMMUNITY): Payer: Self-pay

## 2023-02-05 ENCOUNTER — Other Ambulatory Visit: Payer: Self-pay

## 2023-02-07 ENCOUNTER — Other Ambulatory Visit: Payer: Self-pay

## 2023-02-16 ENCOUNTER — Other Ambulatory Visit: Payer: Self-pay | Admitting: Nurse Practitioner

## 2023-02-16 DIAGNOSIS — E1169 Type 2 diabetes mellitus with other specified complication: Secondary | ICD-10-CM

## 2023-02-19 DIAGNOSIS — H26493 Other secondary cataract, bilateral: Secondary | ICD-10-CM | POA: Diagnosis not present

## 2023-02-19 DIAGNOSIS — E119 Type 2 diabetes mellitus without complications: Secondary | ICD-10-CM | POA: Diagnosis not present

## 2023-02-19 DIAGNOSIS — S0011XA Contusion of right eyelid and periocular area, initial encounter: Secondary | ICD-10-CM | POA: Diagnosis not present

## 2023-02-19 DIAGNOSIS — H1045 Other chronic allergic conjunctivitis: Secondary | ICD-10-CM | POA: Diagnosis not present

## 2023-02-20 DIAGNOSIS — G4733 Obstructive sleep apnea (adult) (pediatric): Secondary | ICD-10-CM | POA: Diagnosis not present

## 2023-02-26 DIAGNOSIS — E1122 Type 2 diabetes mellitus with diabetic chronic kidney disease: Secondary | ICD-10-CM | POA: Diagnosis not present

## 2023-02-26 DIAGNOSIS — N1832 Chronic kidney disease, stage 3b: Secondary | ICD-10-CM | POA: Diagnosis not present

## 2023-02-26 DIAGNOSIS — I10 Essential (primary) hypertension: Secondary | ICD-10-CM | POA: Diagnosis not present

## 2023-02-26 DIAGNOSIS — N2581 Secondary hyperparathyroidism of renal origin: Secondary | ICD-10-CM | POA: Diagnosis not present

## 2023-02-26 DIAGNOSIS — R600 Localized edema: Secondary | ICD-10-CM | POA: Diagnosis not present

## 2023-02-27 ENCOUNTER — Ambulatory Visit: Payer: Medicare Other | Admitting: Nurse Practitioner

## 2023-03-04 ENCOUNTER — Other Ambulatory Visit: Payer: Self-pay

## 2023-03-04 ENCOUNTER — Other Ambulatory Visit (HOSPITAL_COMMUNITY): Payer: Self-pay

## 2023-03-04 NOTE — Progress Notes (Signed)
Specialty Pharmacy Refill Coordination Note  [name] is a 89 y.o. male contacted today regarding refills of specialty medication(s) Enzalutamide [name])   Patient requested Delivery   Delivery date: 03/13/23   Verified address: [address] [place] [address]   Medication will be filled on 03/12/23.

## 2023-03-05 ENCOUNTER — Ambulatory Visit: Payer: Medicare Other | Admitting: Nurse Practitioner

## 2023-03-05 DIAGNOSIS — N1832 Chronic kidney disease, stage 3b: Secondary | ICD-10-CM | POA: Diagnosis not present

## 2023-03-05 DIAGNOSIS — I10 Essential (primary) hypertension: Secondary | ICD-10-CM | POA: Diagnosis not present

## 2023-03-05 DIAGNOSIS — R600 Localized edema: Secondary | ICD-10-CM | POA: Diagnosis not present

## 2023-03-05 DIAGNOSIS — E1122 Type 2 diabetes mellitus with diabetic chronic kidney disease: Secondary | ICD-10-CM | POA: Diagnosis not present

## 2023-03-05 DIAGNOSIS — N2581 Secondary hyperparathyroidism of renal origin: Secondary | ICD-10-CM | POA: Diagnosis not present

## 2023-03-10 ENCOUNTER — Encounter: Payer: Self-pay | Admitting: Nurse Practitioner

## 2023-03-10 ENCOUNTER — Other Ambulatory Visit: Payer: Self-pay

## 2023-03-10 ENCOUNTER — Ambulatory Visit: Payer: Medicare Other | Admitting: Nurse Practitioner

## 2023-03-10 VITALS — BP 126/84 | HR 80 | Temp 96.9°F | Resp 16 | Ht 72.0 in | Wt 248.2 lb

## 2023-03-10 DIAGNOSIS — N184 Chronic kidney disease, stage 4 (severe): Secondary | ICD-10-CM

## 2023-03-10 DIAGNOSIS — J449 Chronic obstructive pulmonary disease, unspecified: Secondary | ICD-10-CM | POA: Diagnosis not present

## 2023-03-10 DIAGNOSIS — E1169 Type 2 diabetes mellitus with other specified complication: Secondary | ICD-10-CM

## 2023-03-10 DIAGNOSIS — C61 Malignant neoplasm of prostate: Secondary | ICD-10-CM

## 2023-03-10 DIAGNOSIS — I700 Atherosclerosis of aorta: Secondary | ICD-10-CM | POA: Diagnosis not present

## 2023-03-10 LAB — POCT GLYCOSYLATED HEMOGLOBIN (HGB A1C): Hemoglobin A1C: 8.7 % — AB (ref 4.0–5.6)

## 2023-03-10 NOTE — Progress Notes (Signed)
 [facility] [address] [place], [place] [address]  Internal MEDICINE  Office Visit Note  Patient Name: [name]  [id number]  [id number]  Date of Service: 03/10/2023  Chief Complaint  Patient presents with   Hyperlipidemia   Hypertension   Diabetes   Follow-up    HPI Thiago presents for a follow-up visit for diabetes, COPD, prostate cancer, and high cholesterol.  Diabetes -- A1c went up to 8.7. currently taking farxiga and glipizide. Was previously on ozempic in 2020. This would be a good option to switch to or add COPD -- on spiriva daily and albuterol prn. This does not always control his SOB and another maintenance hinhaler could be a better option.  Prostate cancer -- currently being treated with an oral chemotherapy drug called Xtandi.  High cholesterol -- taking atorvastatin.  Stage 4 CKD -- currently on farxiga, BP is ok currently.    Current Medication: Outpatient Encounter Medications as of 03/10/2023  Medication Sig   albuterol (VENTOLIN HFA) 108 (90 Base) MCG/ACT inhaler Inhale 2 puffs into the lungs every 4 (four) hours as needed for wheezing or shortness of breath.   atorvastatin (LIPITOR) 10 MG tablet TAKE 1 TABLET(10 MG) BY MOUTH DAILY   Cholecalciferol (VITAMIN D-3 PO) Take 1 capsule by mouth daily.   dapagliflozin propanediol (FARXIGA) 10 MG TABS tablet Take 1 tablet (10 mg total) by mouth daily.   edoxaban (SAVAYSA) 30 MG TABS tablet Take 30 mg by mouth daily.   enzalutamide (XTANDI) 40 MG tablet Take 4 tablets (160 mg total) by mouth daily.   furosemide (LASIX) 20 MG tablet Take 1 tablet (20 mg total) by mouth daily.   glucose blood (ONETOUCH VERIO) test strip USE ONE STRIP DAILY   Metoprolol Tartrate 37.5 MG TABS Take 1 tablet (37.5 mg total) by mouth 2 (two) times daily.   pantoprazole (PROTONIX) 40 MG tablet Take 1 tablet (40 mg total) by mouth daily.   Tiotropium Bromide Monohydrate (SPIRIVA RESPIMAT) 2.5 MCG/ACT AERS Inhale 2 puffs  into the lungs daily.   vitamin B-12 (CYANOCOBALAMIN) 1000 MCG tablet Take 1 tablet (1,000 mcg total) by mouth daily.   [DISCONTINUED] Ensifentrine (OHTUVAYRE) 3 MG/2.5ML SUSP Inhale 3 mg into the lungs in the morning and at bedtime.   [DISCONTINUED] glipiZIDE (GLUCOTROL XL) 2.5 MG 24 hr tablet Take 1 tablet (2.5 mg total) by mouth daily with breakfast.   No facility-administered encounter medications on file as of 03/10/2023.    Surgical History: Past Surgical History:  Procedure Laterality Date   CATARACT EXTRACTION     COLONOSCOPY WITH PROPOFOL N/A 07/02/2019   Procedure: COLONOSCOPY WITH PROPOFOL;  Surgeon: Toney Reil, MD;  Location: [facility] ENDOSCOPY;  Service: Gastroenterology;  Laterality: N/A;   ESOPHAGOGASTRODUODENOSCOPY (EGD) WITH PROPOFOL N/A 03/19/2022   Procedure: ESOPHAGOGASTRODUODENOSCOPY (EGD) WITH PROPOFOL;  Surgeon: Wyline Mood, MD;  Location: [facility] ENDOSCOPY;  Service: Gastroenterology;  Laterality: N/A;   IR CT HEAD LTD  08/27/2019   IR PERCUTANEOUS ART THROMBECTOMY/INFUSION INTRACRANIAL INC DIAG ANGIO  08/27/2019       IR PERCUTANEOUS ART THROMBECTOMY/INFUSION INTRACRANIAL INC DIAG ANGIO  08/27/2019   LOWER EXTREMITY ANGIOGRAPHY Left 12/25/2020   Procedure: Lower Extremity Angiography;  Surgeon: Annice Needy, MD;  Location: [facility] INVASIVE CV LAB;  Service: Cardiovascular;  Laterality: Left;   PACEMAKER LEADLESS INSERTION N/A 12/19/2020   Procedure: PACEMAKER LEADLESS INSERTION;  Surgeon: Marcina Millard, MD;  Location: [facility] INVASIVE CV LAB;  Service: Cardiovascular;  Laterality: N/A;   PROSTATE CANCER  PROSTATE SURGERY     RADIOLOGY WITH ANESTHESIA N/A 08/27/2019   Procedure: IR WITH ANESTHESIA;  Surgeon: Julieanne Cotton, MD;  Location: [facility] OR;  Service: Radiology;  Laterality: N/A;    Medical History: Past Medical History:  Diagnosis Date   Acute deep vein thrombosis (DVT) of femoral vein of left lower extremity (HCC)    Atrial fibrillation (HCC)    CHF  (congestive heart failure) (HCC)    Chronic kidney disease    Diabetes (HCC)    Hearing loss    High blood pressure    History of bladder problems    Hyperlipidemia    Kidney stones 11/22/2014   Liver cyst    OSA (obstructive sleep apnea)    Prostate cancer (HCC)    Prostate cancer (HCC)    Stroke (HCC)    Valvular heart disease     Family History: Family History  Problem Relation Age of Onset   Colon cancer Mother    Diabetes Other    High blood pressure Other    Prostate cancer Brother    Diabetes Brother     Social History   Socioeconomic History   Marital status: Married    Spouse name: Divine Hansley   Number of children: 2   Years of education: 12   Highest education level: 12th grade  Occupational History   Occupation: [employer]  Tobacco Use   Smoking status: Former    Types: Cigarettes   Smokeless tobacco: Never  Vaping Use   Vaping status: Never Used  Substance and Sexual Activity   Alcohol use: No   Drug use: No   Sexual activity: Not Currently  Other Topics Concern   Not on file  Social History Narrative   Not on file   Social Drivers of Health   Financial Resource Strain: Low Risk  (10/29/2019)   Overall Financial Resource Strain (CARDIA)    Difficulty of Paying Living Expenses: Not very hard  Recent Concern: Financial Resource Strain - Medium Risk (09/08/2019)   Overall Financial Resource Strain (CARDIA)    Difficulty of Paying Living Expenses: Somewhat hard  Food Insecurity: No Food Insecurity (09/27/2022)   Hunger Vital Sign    Worried About Running Out of Food in the Last Year: Never true    Ran Out of Food in the Last Year: Never true  Transportation Needs: No Transportation Needs (09/27/2022)   PRAPARE - Administrator, Civil Service (Medical): No    Lack of Transportation (Non-Medical): No  Physical Activity: Inactive (10/29/2019)   Exercise Vital Sign    Days of Exercise per Week: 0 days    Minutes of Exercise per Session:  0 min  Stress: No Stress Concern Present (10/29/2019)   [employer] of Occupational Health - Occupational Stress Questionnaire    Feeling of Stress : Only a little  Recent Concern: Stress - Stress Concern Present (09/08/2019)   [employer] of Occupational Health - Occupational Stress Questionnaire    Feeling of Stress : To some extent  Social Connections: Moderately Integrated (10/29/2019)   Social Connection and Isolation Panel [NHANES]    Frequency of Communication with Friends and Family: More than three times a week    Frequency of Social Gatherings with Friends and Family: More than three times a week    Attends Religious Services: More than 4 times per year    Active Member of [employer] or Organizations: No    Attends Banker Meetings: Never  Marital Status: Married  Catering manager Violence: Not At Risk (09/22/2022)   Humiliation, Afraid, Rape, and Kick questionnaire    Fear of Current or Ex-Partner: No    Emotionally Abused: No    Physically Abused: No    Sexually Abused: No      Review of Systems  Constitutional:  Negative for chills, fatigue and unexpected weight change.  HENT:  Negative for congestion, postnasal drip, rhinorrhea, sneezing and sore throat.   Eyes:  Negative for redness.  Respiratory: Negative.  Negative for cough, chest tightness, shortness of breath and wheezing.   Cardiovascular: Negative.  Negative for chest pain and palpitations.  Gastrointestinal:  Negative for abdominal pain, constipation, diarrhea, nausea and vomiting.  Musculoskeletal:  Positive for arthralgias, gait problem and joint swelling. Negative for back pain and neck pain.  Skin:  Negative for rash.  Neurological:  Negative for tremors and numbness.  Hematological:  Negative for adenopathy. Bruises/bleeds easily (taking edoxaban).  Psychiatric/Behavioral:  Negative for behavioral problems (Depression), sleep disturbance and suicidal ideas. The patient is not  nervous/anxious.     Vital Signs: BP 126/84   Pulse 80   Temp (!) 96.9 F (36.1 C)   Resp 16   Ht 6' (1.829 m)   Wt 248 lb 3.2 oz (112.6 kg)   SpO2 93%   BMI 33.66 kg/m    Physical Exam Vitals reviewed.  Constitutional:      General: He is not in acute distress.    Appearance: Normal appearance. He is obese. He is not ill-appearing.  HENT:     Head: Normocephalic and atraumatic.  Eyes:     Pupils: Pupils are equal, round, and reactive to light.  Cardiovascular:     Rate and Rhythm: Normal rate and regular rhythm.  Pulmonary:     Effort: Pulmonary effort is normal. No respiratory distress.  Neurological:     Mental Status: He is alert and oriented to person, place, and time.  Psychiatric:        Mood and Affect: Mood normal.        Behavior: Behavior normal.        Assessment/Plan: 1. Type 2 diabetes mellitus with other specified complication, without long-term current use of insulin (HCC) (Primary) A1c significantly increased to 8.7. samples of ozempic given to patient, explained how to use the injector pen. Start with 0.25 mg once weekly then increase to 0.5 mg once weekly. Will apply for patient financial assistance with novo nordisk.  - POCT glycosylated hemoglobin (Hb A1C)  2. Chronic obstructive pulmonary disease, unspecified COPD type (HCC) Continue spiriva as prescribed and prn albuterol as prescribed. Consider switching to a triple therapy inhaler.  3. Atherosclerosis of aorta (HCC) Continue atorvastatin as prescribed.   4. CKD (chronic kidney disease), stage IV (HCC) Continuing to monitor closely, on farxiga currently. Not on ACEi or ARB yet, but if BP increases, will consider adding one.   5. Prostate cancer [facility]) On active treatment with Xtandi   General Counseling: Nelva Bush understanding of the findings of todays visit and agrees with plan of treatment. I have discussed any further diagnostic evaluation that may be needed or ordered  today. We also reviewed his medications today. he has been encouraged to call the office with any questions or concerns that should arise related to todays visit.    Orders Placed This Encounter  Procedures   POCT glycosylated hemoglobin (Hb A1C)    No orders of the defined types were placed in this encounter.  Return in about 1 month (around 04/07/2023) for F/U, Margrett Kalb PCP, eval new med.   Total time spent:30 Minutes Time spent includes review of chart, medications, test results, and follow up plan with the patient.   [place] Controlled Substance Database was reviewed by me.  This patient was seen by [name], FNP-C in collaboration with Dr. Beverely Risen as a part of collaborative care agreement.   Yechiel Erny R. Tedd Sias, MSN, FNP-C Internal medicine

## 2023-03-11 ENCOUNTER — Other Ambulatory Visit: Payer: Self-pay

## 2023-03-11 NOTE — Progress Notes (Signed)
Patient wife was contacted that due to possible impending winter storm, medication will arrive on Wednesday 03/12/23

## 2023-03-13 ENCOUNTER — Telehealth: Payer: Self-pay | Admitting: Nurse Practitioner

## 2023-03-13 NOTE — Telephone Encounter (Signed)
Patient called stating he only had 3 ozempic samples left. Requesting rx be sent to pharmacy. Per Alyssa, patient needs to bring in financial assistance form. Per patient, he will bring application in to office tomorrow and we will supply him with more samples-[name]

## 2023-03-20 ENCOUNTER — Telehealth: Payer: Self-pay

## 2023-03-20 NOTE — Telephone Encounter (Signed)
 Patient's wife called to report that he has been using Ozempic daily. Alyssa explained to the patient that this is a weekly injection, not daily, when patient was in the office and given samples. Advised patient's wife to please make sure that patient does not administer another injection until next week.

## 2023-03-20 NOTE — Telephone Encounter (Signed)
 Patient called stating he is almost out of ozempic sample, he has been giving himself shots everyday, patient was advised he was suppose to only inject himself once a week and was told to hold off on giving himself a shot for a whole week.

## 2023-03-21 DIAGNOSIS — G4733 Obstructive sleep apnea (adult) (pediatric): Secondary | ICD-10-CM | POA: Diagnosis not present

## 2023-03-22 ENCOUNTER — Emergency Department
Admission: EM | Admit: 2023-03-22 | Discharge: 2023-03-22 | Disposition: A | Discharge status: Home / Self Care | Attending: Emergency Medicine | Admitting: Emergency Medicine

## 2023-03-22 ENCOUNTER — Other Ambulatory Visit: Payer: Self-pay

## 2023-03-22 DIAGNOSIS — E860 Dehydration: Secondary | ICD-10-CM | POA: Diagnosis not present

## 2023-03-22 DIAGNOSIS — R109 Unspecified abdominal pain: Secondary | ICD-10-CM | POA: Diagnosis present

## 2023-03-22 DIAGNOSIS — E119 Type 2 diabetes mellitus without complications: Secondary | ICD-10-CM | POA: Diagnosis not present

## 2023-03-22 DIAGNOSIS — I4891 Unspecified atrial fibrillation: Secondary | ICD-10-CM | POA: Insufficient documentation

## 2023-03-22 DIAGNOSIS — I509 Heart failure, unspecified: Secondary | ICD-10-CM | POA: Insufficient documentation

## 2023-03-22 LAB — COMPREHENSIVE METABOLIC PANEL
ALT: 10 U/L (ref 0–44)
AST: 18 U/L (ref 15–41)
Albumin: 3.7 g/dL (ref 3.5–5.0)
Alkaline Phosphatase: 116 U/L (ref 38–126)
Anion gap: 9 (ref 5–15)
BUN: 32 mg/dL — ABNORMAL HIGH (ref 8–23)
CO2: 28 mmol/L (ref 22–32)
Calcium: 9.6 mg/dL (ref 8.9–10.3)
Chloride: 103 mmol/L (ref 98–111)
Creatinine, Ser: 2.28 mg/dL — ABNORMAL HIGH (ref 0.61–1.24)
GFR, Estimated: 27 mL/min — ABNORMAL LOW (ref >60)
Glucose, Bld: 107 mg/dL — ABNORMAL HIGH (ref 70–99)
Potassium: 3.7 mmol/L (ref 3.5–5.1)
Sodium: 140 mmol/L (ref 135–145)
Total Bilirubin: 1.2 mg/dL (ref 0.0–1.2)
Total Protein: 8.1 g/dL (ref 6.5–8.1)

## 2023-03-22 LAB — CBC
HCT: 50.3 % (ref 39.0–52.0)
Hemoglobin: 16.1 g/dL (ref 13.0–17.0)
MCH: 30.4 pg (ref 26.0–34.0)
MCHC: 32 g/dL (ref 30.0–36.0)
MCV: 94.9 fL (ref 80.0–100.0)
Platelets: 166 10*3/uL (ref 150–400)
RBC: 5.3 MIL/uL (ref 4.22–5.81)
RDW: 14.6 % (ref 11.5–15.5)
WBC: 5.4 10*3/uL (ref 4.0–10.5)
nRBC: 0 % (ref 0.0–0.2)

## 2023-03-22 LAB — CBG MONITORING, ED: Glucose-Capillary: 92 mg/dL (ref 70–99)

## 2023-03-22 LAB — LIPASE, BLOOD: Lipase: 29 U/L (ref 11–51)

## 2023-03-22 MED ORDER — SODIUM CHLORIDE 0.9 % IV SOLN: Freq: Once | INTRAVENOUS | Status: AC

## 2023-03-22 NOTE — ED Triage Notes (Signed)
 Brought over from [facility]. Patient has been taking ozempic daily since 03/10/23 instead of once a week.   C/o dry mouth, abdominal pain and nausea  KC vitals:   100/69 b/p 80HR 94% RA 97.3oral

## 2023-03-22 NOTE — ED Provider Notes (Signed)
 [facility] Provider Note    Event Date/Time   First MD Initiated Contact with Patient 03/22/23 [phone]     (approximate)   History   Medication Reaction   HPI  [name] is a 89 y.o. male with a history of CHF, atrial fibrillation, diabetes who presents with mild abdominal discomfort.  The patient was sent here from clinic because he has apparently been taking his Ozempic incorrectly.  Instead of taking it once a week he has been taking it daily.  He was sent here for evaluation.  He reports overall he feels well and does not have significant abdominal pain.  He reports he has decreased hunger since taking the Ozempic but no vomiting.  He is scheduled to see a kidney doctor because of elevated creatinine per records   Physical Exam   Triage Vital Signs: ED Triage Vitals  Encounter Vitals Group     BP 03/22/23 0943 (!) 102/53     Systolic BP Percentile --      Diastolic BP Percentile --      Pulse Rate 03/22/23 0943 99     Resp 03/22/23 0943 18     Temp 03/22/23 0943 (!) 97.5 F (36.4 C)     Temp Source 03/22/23 0943 Oral     SpO2 03/22/23 0943 97 %     Weight 03/22/23 0941 106.4 kg (234 lb 9.6 oz)     Height 03/22/23 0941 1.829 m (6')     Head Circumference --      Peak Flow --      Pain Score 03/22/23 0942 5     Pain Loc --      Pain Education --      Exclude from Growth Chart --     Most recent vital signs: Vitals:   03/22/23 1130 03/22/23 1145  BP: 127/79   Pulse: 69   Resp: 15   Temp:    SpO2:  95%     General: Awake, no distress.  Pleasant and interactive CV:  Good peripheral perfusion.  Resp:  Normal effort.  Abd:  No distention.  Soft, nontender Other:     ED Results / Procedures / Treatments   Labs (all labs ordered are listed, but only abnormal results are displayed) Labs Reviewed  COMPREHENSIVE METABOLIC PANEL - Abnormal; Notable for the following components:      Result Value   Glucose, Bld 107 (*)    BUN  32 (*)    Creatinine, Ser 2.28 (*)    GFR, Estimated 27 (*)    All other components within normal limits  CBC  LIPASE, BLOOD  CBG MONITORING, ED     EKG  ED ECG REPORT I, Jene Every, the attending physician, personally viewed and interpreted this ECG.  Date: 03/22/2023  Rhythm: Atrial fibrillation QRS Axis: normal Intervals: Abnormal, right bundle branch block ST/T Wave abnormalities: normal Narrative Interpretation: no evidence of acute ischemia   RADIOLOGY     PROCEDURES:  Critical Care performed:   Procedures   MEDICATIONS ORDERED IN ED: Medications  0.9 %  sodium chloride infusion ( Intravenous New Bag/Given 03/22/23 1013)     IMPRESSION / MDM / ASSESSMENT AND PLAN / ED COURSE  I reviewed the triage vital signs and the nursing notes. Patient's presentation is most consistent with acute presentation with potential threat to life or bodily function.  Patient here after significant overuse of Ozempic in the setting of a history of chronic kidney  disease.  Concern for electrolyte abnormalities, dehydration, acute on chronic kidney injury.  Initial blood pressure is soft  Will obtain labs, give a liter of IV saline and reevaluate  Lab work is overall consistent with prior levels, no acute abnormalities.  He feels well after fluids, no abdominal pain no nausea no vomiting.  No indication for admission to the hospital at this time, appropriate for discharge with outpatient follow-up, return precautions discussed, counseled patient on the appropriate way to take Ozempic and he and his wife agree with this plan.        FINAL CLINICAL IMPRESSION(S) / ED DIAGNOSES   Final diagnoses:  Dehydration     Rx / DC Orders   ED Discharge Orders     None        Note:  This document was prepared using Dragon voice recognition software and may include unintentional dictation errors.   Jene Every, MD 03/22/23 [phone]

## 2023-03-25 ENCOUNTER — Telehealth: Payer: Self-pay

## 2023-03-25 NOTE — Patient Outreach (Signed)
 Care Coordination   03/25/2023 Name: HAEDEN HUDOCK MRN: [id number] DOB: [birth year]   Care Coordination Outreach Attempts:  Telephone call attempt to patient. Unable to reach or leave voice message. Phone only rang.    Follow Up Plan:  Additional outreach attempts will be made to offer the patient complex care management information and services.   Encounter Outcome:  No Answer   Care Coordination Interventions:  No, not indicated    George Ina RN, BSN, CCM [employer], Population Health Case Manager Phone: [phone]

## 2023-03-26 ENCOUNTER — Ambulatory Visit: Payer: Self-pay

## 2023-03-26 NOTE — Patient Outreach (Signed)
 Care Coordination   Follow Up Visit Note   03/26/2023 Name: [name] MRN: [id number] DOB: 04-03-1933  [name] is a 89 y.o. year old male who sees [name], NP for primary care. I  spoke with   What matters to the patients health and wellness today?  Wife states patient is doing well. She states he is currently at work. Wife states patient went to ED on 03/22/23 due to taking to much Ozempic. She states patient took the prescribed Ozempic daily for several days. Wife states patient is doing well overall with no side effects from the medication. Wife states she is not sure what patients blood sugars have been.  Wife requested RN case manager speak with patient directly. Wife confirmed RN case manager call back phone number and states she will have patient return call.    Goals Addressed             This Visit's Progress    improvement post hospitalization and management of health conditions.       Interventions Today    Flowsheet Row Most Recent Value  Chronic Disease   Chronic disease during today's visit Diabetes  General Interventions   General Interventions Discussed/Reviewed General Interventions Reviewed  [evaluation of current treatment plan for listed health conditions and patients adherence to plan as established by provider. Assessed for blood sugar readings.]  Pharmacy Interventions   Pharmacy Dicussed/Reviewed Pharmacy Topics Reviewed  [Discussed recent  ED follow up due to misuse of Ozempic.]              SDOH assessments and interventions completed:  No     Care Coordination Interventions:  Yes, provided   Follow up plan:  awaiting return call from patient    Encounter Outcome:  Patient Visit Completed   George Ina RN, BSN, CCM [place]  [facility], Population Health Case Manager Phone: [phone]

## 2023-03-27 ENCOUNTER — Ambulatory Visit (INDEPENDENT_AMBULATORY_CARE_PROVIDER_SITE_OTHER): Admitting: Nurse Practitioner

## 2023-03-27 ENCOUNTER — Encounter: Payer: Self-pay | Admitting: Nurse Practitioner

## 2023-03-27 VITALS — BP 110/72 | HR 91 | Temp 97.8°F | Resp 16 | Ht 72.0 in | Wt 240.4 lb

## 2023-03-27 DIAGNOSIS — N184 Chronic kidney disease, stage 4 (severe): Secondary | ICD-10-CM | POA: Diagnosis not present

## 2023-03-27 DIAGNOSIS — E860 Dehydration: Secondary | ICD-10-CM

## 2023-03-27 DIAGNOSIS — E1169 Type 2 diabetes mellitus with other specified complication: Secondary | ICD-10-CM | POA: Diagnosis not present

## 2023-03-27 NOTE — Progress Notes (Signed)
 [facility] [address] [place], [place] [address]  Internal MEDICINE  Office Visit Note  Patient Name: [name]  [id number]  [id number]  Date of Service: 03/27/2023  Chief Complaint  Patient presents with   Hyperlipidemia   Hypertension   Diabetes   Follow-up    ED f/u     HPI [name] presents for a follow-up visit for recent ED visit due to dehydration from taking a new medication incorrectly.  At a recent office visit, the patient was given a sample of ozempic . He was verbally instructed to administered the injectable medication once weekly which is every 7 days. This is in the instructions that come with the sample. It was also on his AVS report at check out. IT was written on the sample box and on a sticky note on the sample box.  The patient proceeded to administered the injectable medication ozempic  at the dose 0.25 mg once daily for the next 4 days after that office visit.  Due to the side effects of the medication, he ended up dehydrated and visited the ER due to this. He was given intravenous fluids and told to stop the medication.  Today we discussed stopping the medication and trying something different but the patient insisted that he wanted to try again and emphasized that he knows what he is supposed to do with this medication and he will make sure that he only takes it once a week. He also did get approved for patient financial assistance through the manufacturer.     Current Medication: Outpatient Encounter Medications as of 03/27/2023  Medication Sig   albuterol  (VENTOLIN  HFA) 108 (90 Base) MCG/ACT inhaler Inhale 2 puffs into the lungs every 4 (four) hours as needed for wheezing or shortness of breath.   atorvastatin  (LIPITOR) 10 MG tablet TAKE 1 TABLET(10 MG) BY MOUTH DAILY   Cholecalciferol (VITAMIN D-3 PO) Take 1 capsule by mouth daily.   dapagliflozin  propanediol (FARXIGA ) 10 MG TABS tablet Take 1 tablet (10 mg total) by mouth daily.    edoxaban  (SAVAYSA ) 30 MG TABS tablet Take 30 mg by mouth daily.   enzalutamide  (XTANDI ) 40 MG tablet Take 4 tablets (160 mg total) by mouth daily.   furosemide  (LASIX ) 20 MG tablet Take 1 tablet (20 mg total) by mouth daily.   glucose blood (ONETOUCH VERIO) test strip USE ONE STRIP DAILY   Metoprolol  Tartrate 37.5 MG TABS Take 1 tablet (37.5 mg total) by mouth 2 (two) times daily.   pantoprazole  (PROTONIX ) 40 MG tablet Take 1 tablet (40 mg total) by mouth daily.   Tiotropium Bromide  Monohydrate (SPIRIVA  RESPIMAT) 2.5 MCG/ACT AERS Inhale 2 puffs into the lungs daily.   vitamin B-12 (CYANOCOBALAMIN ) 1000 MCG tablet Take 1 tablet (1,000 mcg total) by mouth daily.   No facility-administered encounter medications on file as of 03/27/2023.    Surgical History: Past Surgical History:  Procedure Laterality Date   CATARACT EXTRACTION     COLONOSCOPY WITH PROPOFOL  N/A 07/02/2019   Procedure: COLONOSCOPY WITH PROPOFOL ;  Surgeon: Selena Daily, MD;  Location: [facility] ENDOSCOPY;  Service: Gastroenterology;  Laterality: N/A;   ESOPHAGOGASTRODUODENOSCOPY (EGD) WITH PROPOFOL  N/A 03/19/2022   Procedure: ESOPHAGOGASTRODUODENOSCOPY (EGD) WITH PROPOFOL ;  Surgeon: Luke Salaam, MD;  Location: [facility] ENDOSCOPY;  Service: Gastroenterology;  Laterality: N/A;   IR CT HEAD LTD  08/27/2019   IR PERCUTANEOUS ART THROMBECTOMY/INFUSION INTRACRANIAL INC DIAG ANGIO  08/27/2019       IR PERCUTANEOUS ART THROMBECTOMY/INFUSION INTRACRANIAL INC DIAG ANGIO  08/27/2019   LOWER EXTREMITY ANGIOGRAPHY Left 12/25/2020   Procedure: Lower Extremity Angiography;  Surgeon: Celso College, MD;  Location: [facility] INVASIVE CV LAB;  Service: Cardiovascular;  Laterality: Left;   PACEMAKER LEADLESS INSERTION N/A 12/19/2020   Procedure: PACEMAKER LEADLESS INSERTION;  Surgeon: Percival Brace, MD;  Location: [facility] INVASIVE CV LAB;  Service: Cardiovascular;  Laterality: N/A;   PROSTATE CANCER     PROSTATE SURGERY     RADIOLOGY WITH ANESTHESIA N/A  08/27/2019   Procedure: IR WITH ANESTHESIA;  Surgeon: Luellen Sages, MD;  Location: [facility] OR;  Service: Radiology;  Laterality: N/A;    Medical History: Past Medical History:  Diagnosis Date   Acute deep vein thrombosis (DVT) of femoral vein of left lower extremity (HCC)    Atrial fibrillation (HCC)    CHF (congestive heart failure) (HCC)    Chronic kidney disease    Diabetes (HCC)    Hearing loss    High blood pressure    History of bladder problems    Hyperlipidemia    Kidney stones 11/22/2014   Liver cyst    OSA (obstructive sleep apnea)    Prostate cancer (HCC)    Prostate cancer (HCC)    Stroke (HCC)    Valvular heart disease     Family History: Family History  Problem Relation Age of Onset   Colon cancer Mother    Diabetes Other    High blood pressure Other    Prostate cancer Brother    Diabetes Brother     Social History   Socioeconomic History   Marital status: Married    Spouse name: [name]   Number of children: 2   Years of education: 12   Highest education level: 12th grade  Occupational History   Occupation: [employer]  Tobacco Use   Smoking status: Former    Types: Cigarettes   Smokeless tobacco: Never  Vaping Use   Vaping status: Never Used  Substance and Sexual Activity   Alcohol use: No   Drug use: No   Sexual activity: Not Currently  Other Topics Concern   Not on file  Social History Narrative   Not on file   Social Drivers of Health   Financial Resource Strain: Low Risk  (10/29/2019)   Overall Financial Resource Strain (CARDIA)    Difficulty of Paying Living Expenses: Not very hard  Recent Concern: Financial Resource Strain - Medium Risk (09/08/2019)   Overall Financial Resource Strain (CARDIA)    Difficulty of Paying Living Expenses: Somewhat hard  Food Insecurity: No Food Insecurity (09/27/2022)   Hunger Vital Sign    Worried About Running Out of Food in the Last Year: Never true    Ran Out of Food in the Last Year: Never  true  Transportation Needs: No Transportation Needs (09/27/2022)   PRAPARE - Administrator, Civil Service (Medical): No    Lack of Transportation (Non-Medical): No  Physical Activity: Inactive (10/29/2019)   Exercise Vital Sign    Days of Exercise per Week: 0 days    Minutes of Exercise per Session: 0 min  Stress: No Stress Concern Present (10/29/2019)   [employer] of Occupational Health - Occupational Stress Questionnaire    Feeling of Stress : Only a little  Recent Concern: Stress - Stress Concern Present (09/08/2019)   [employer] of Occupational Health - Occupational Stress Questionnaire    Feeling of Stress : To some extent  Social Connections: Moderately Integrated (10/29/2019)   Social Connection and  Isolation Panel [NHANES]    Frequency of Communication with Friends and Family: More than three times a week    Frequency of Social Gatherings with Friends and Family: More than three times a week    Attends Religious Services: More than 4 times per year    Active Member of [employer] or Organizations: No    Attends Banker Meetings: Never    Marital Status: Married  Catering manager Violence: Not At Risk (09/22/2022)   Humiliation, Afraid, Rape, and Kick questionnaire    Fear of Current or Ex-Partner: No    Emotionally Abused: No    Physically Abused: No    Sexually Abused: No      Review of Systems  Constitutional:  Positive for fatigue (improving now that the effects of the medication have worn off.). Negative for chills and unexpected weight change.  HENT:  Negative for congestion, postnasal drip, rhinorrhea, sneezing and sore throat.   Eyes:  Negative for redness.  Respiratory: Negative.  Negative for cough, chest tightness, shortness of breath and wheezing.   Cardiovascular: Negative.  Negative for chest pain and palpitations.  Gastrointestinal:  Negative for abdominal pain, constipation, diarrhea, nausea and vomiting.  Musculoskeletal:   Positive for arthralgias, gait problem and joint swelling. Negative for back pain and neck pain.  Skin:  Negative for rash.  Neurological:  Negative for tremors and numbness.  Hematological:  Negative for adenopathy. Bruises/bleeds easily (taking edoxaban ).  Psychiatric/Behavioral:  Negative for behavioral problems (Depression), sleep disturbance and suicidal ideas. The patient is not nervous/anxious.     Vital Signs: BP 110/72   Pulse 91   Temp 97.8 F (36.6 C)   Resp 16   Ht 6' (1.829 m)   Wt 240 lb 6.4 oz (109 kg)   SpO2 95%   BMI 32.60 kg/m    Physical Exam Vitals reviewed.  Constitutional:      General: He is not in acute distress.    Appearance: Normal appearance. He is obese. He is not ill-appearing.  HENT:     Head: Normocephalic and atraumatic.  Cardiovascular:     Rate and Rhythm: Normal rate and regular rhythm.  Neurological:     Mental Status: He is alert.        Assessment/Plan: 1. Type 2 diabetes mellitus with other specified complication, without long-term current use of insulin  (HCC) (Primary) Continue ozempic  as prescribed. 1 sample given to patient. He also verbalized the correct instruction on how to give himself the medication and how often to take ozempic  at least 3 times during his office visit today. Follow up in about 1 month to reassess adherence to correct medication administration.   2. Dehydration Resolved   3. CKD (chronic kidney disease), stage IV (HCC) Continue to monitor BMP periodically    General Counseling: Bruna Capers understanding of the findings of todays visit and agrees with plan of treatment. I have discussed any further diagnostic evaluation that may be needed or ordered today. We also reviewed his medications today. he has been encouraged to call the office with any questions or concerns that should arise related to todays visit.    No orders of the defined types were placed in this encounter.   No orders of the  defined types were placed in this encounter.   Return in about 4 weeks (around 04/24/2023) for F/U, Carlin Mamone PCP diabetes.   Total time spent:30 Minutes Time spent includes review of chart, medications, test results, and follow up plan with the  patient.   [place] Controlled Substance Database was reviewed by me.  This patient was seen by Laurence Pons, FNP-C in collaboration with Dr. Verneta Gone as a part of collaborative care agreement.   Amunique Neyra R. Bobbi Burow, MSN, FNP-C Internal medicine

## 2023-03-28 DIAGNOSIS — I5032 Chronic diastolic (congestive) heart failure: Secondary | ICD-10-CM | POA: Diagnosis not present

## 2023-03-28 DIAGNOSIS — Z950 Presence of cardiac pacemaker: Secondary | ICD-10-CM | POA: Diagnosis not present

## 2023-03-28 DIAGNOSIS — I7143 Infrarenal abdominal aortic aneurysm, without rupture: Secondary | ICD-10-CM | POA: Diagnosis not present

## 2023-03-28 DIAGNOSIS — E782 Mixed hyperlipidemia: Secondary | ICD-10-CM | POA: Diagnosis not present

## 2023-03-28 DIAGNOSIS — I482 Chronic atrial fibrillation, unspecified: Secondary | ICD-10-CM | POA: Diagnosis not present

## 2023-03-28 DIAGNOSIS — N1832 Chronic kidney disease, stage 3b: Secondary | ICD-10-CM | POA: Diagnosis not present

## 2023-03-28 DIAGNOSIS — R600 Localized edema: Secondary | ICD-10-CM | POA: Diagnosis not present

## 2023-03-28 DIAGNOSIS — G4733 Obstructive sleep apnea (adult) (pediatric): Secondary | ICD-10-CM | POA: Diagnosis not present

## 2023-03-28 DIAGNOSIS — I5022 Chronic systolic (congestive) heart failure: Secondary | ICD-10-CM | POA: Diagnosis not present

## 2023-03-28 DIAGNOSIS — J449 Chronic obstructive pulmonary disease, unspecified: Secondary | ICD-10-CM | POA: Diagnosis not present

## 2023-03-28 DIAGNOSIS — I10 Essential (primary) hypertension: Secondary | ICD-10-CM | POA: Diagnosis not present

## 2023-04-02 ENCOUNTER — Other Ambulatory Visit: Payer: Self-pay

## 2023-04-02 NOTE — Progress Notes (Signed)
 Specialty Pharmacy Refill Coordination Note  [name] is a 89 y.o. male contacted today regarding refills of specialty medication(s) Enzalutamide [name])   Spoke with patient's daughter tiffany  Patient requested Delivery   Delivery date: 04/08/23   Verified address: [address] [place] [address]   Medication will be filled on 03.14.25.

## 2023-04-04 ENCOUNTER — Telehealth: Payer: Self-pay

## 2023-04-04 ENCOUNTER — Telehealth: Payer: Self-pay | Admitting: Nurse Practitioner

## 2023-04-04 NOTE — Telephone Encounter (Signed)
 Patient's wife lvm on after-hour line stating they had missed call from office. I tried calling her back to let her know his ozempic samples were ready for p/u. No answer. No vm set up to leave [facility]

## 2023-04-04 NOTE — Telephone Encounter (Signed)
 Tat try to call pt that ozempic samples ready for pickup

## 2023-04-07 ENCOUNTER — Ambulatory Visit: Payer: Medicare Other | Admitting: Nurse Practitioner

## 2023-04-07 ENCOUNTER — Encounter: Payer: Self-pay | Admitting: Nurse Practitioner

## 2023-04-07 ENCOUNTER — Other Ambulatory Visit: Payer: Self-pay

## 2023-04-07 VITALS — BP 109/66 | HR 88 | Temp 98.1°F | Resp 16 | Ht 72.0 in | Wt 241.8 lb

## 2023-04-07 DIAGNOSIS — I700 Atherosclerosis of aorta: Secondary | ICD-10-CM | POA: Diagnosis not present

## 2023-04-07 DIAGNOSIS — E1169 Type 2 diabetes mellitus with other specified complication: Secondary | ICD-10-CM

## 2023-04-07 DIAGNOSIS — I10 Essential (primary) hypertension: Secondary | ICD-10-CM

## 2023-04-07 DIAGNOSIS — I152 Hypertension secondary to endocrine disorders: Secondary | ICD-10-CM

## 2023-04-07 DIAGNOSIS — J449 Chronic obstructive pulmonary disease, unspecified: Secondary | ICD-10-CM

## 2023-04-07 DIAGNOSIS — E1159 Type 2 diabetes mellitus with other circulatory complications: Secondary | ICD-10-CM

## 2023-04-07 NOTE — Progress Notes (Signed)
 [facility] [address] [place], [place] [address]  Internal MEDICINE  Office Visit Note  Patient Name: [name]  [id number]  [id number]  Date of Service: 04/07/2023  Chief Complaint  Patient presents with   Diabetes   Hypertension   Hyperlipidemia   Follow-up    HPI [name] presents for a follow-up visit for diabetes and ozempic .  Today is the second week with the ozempic  shot and he is doing well with that. Glucose levels are good and improving. Hypertension -- blood pressure is controlled with metoprolol  and furosemide . High cholesterol -- taking atorvastatin     Current Medication: Outpatient Encounter Medications as of 04/07/2023  Medication Sig   albuterol  (VENTOLIN  HFA) 108 (90 Base) MCG/ACT inhaler Inhale 2 puffs into the lungs every 4 (four) hours as needed for wheezing or shortness of breath.   atorvastatin  (LIPITOR) 10 MG tablet TAKE 1 TABLET(10 MG) BY MOUTH Werner   Cholecalciferol (VITAMIN D-3 PO) Take 1 capsule by mouth Werner.   dapagliflozin  propanediol (FARXIGA ) 10 MG TABS tablet Take 1 tablet (10 mg total) by mouth Werner.   edoxaban  (SAVAYSA ) 30 MG TABS tablet Take 30 mg by mouth Werner.   enzalutamide  (XTANDI ) 40 MG tablet Take 4 tablets (160 mg total) by mouth Werner.   furosemide  (LASIX ) 20 MG tablet Take 1 tablet (20 mg total) by mouth Werner.   glucose blood (ONETOUCH VERIO) test strip USE ONE STRIP Werner   Metoprolol  Tartrate 37.5 MG TABS Take 1 tablet (37.5 mg total) by mouth 2 (two) times Werner.   pantoprazole  (PROTONIX ) 40 MG tablet Take 1 tablet (40 mg total) by mouth Werner.   Tiotropium Bromide  Monohydrate (SPIRIVA  RESPIMAT) 2.5 MCG/ACT AERS Inhale 2 puffs into the lungs Werner.   vitamin B-12 (CYANOCOBALAMIN ) 1000 MCG tablet Take 1 tablet (1,000 mcg total) by mouth Werner.   No facility-administered encounter medications on file as of 04/07/2023.    Surgical History: Past Surgical History:  Procedure Laterality Date   CATARACT  EXTRACTION     COLONOSCOPY WITH PROPOFOL  N/A 07/02/2019   Procedure: COLONOSCOPY WITH PROPOFOL ;  Surgeon: Glenn Daily, MD;  [name] ENDOSCOPY;  Service: Gastroenterology;  Laterality: N/A;   ESOPHAGOGASTRODUODENOSCOPY (EGD) WITH PROPOFOL  N/A 03/19/2022   Procedure: ESOPHAGOGASTRODUODENOSCOPY (EGD) WITH PROPOFOL ;  Surgeon: Glenn Salaam, MD;  Location: [facility] ENDOSCOPY;  Service: Gastroenterology;  Laterality: N/A;   IR CT HEAD LTD  08/27/2019   IR PERCUTANEOUS ART THROMBECTOMY/INFUSION INTRACRANIAL INC DIAG ANGIO  08/27/2019       IR PERCUTANEOUS ART THROMBECTOMY/INFUSION INTRACRANIAL INC DIAG ANGIO  08/27/2019   LOWER EXTREMITY ANGIOGRAPHY Left 12/25/2020   Procedure: Lower Extremity Angiography;  Surgeon: Glenn College, MD;  Location: [facility] INVASIVE CV LAB;  Service: Cardiovascular;  Laterality: Left;   PACEMAKER LEADLESS INSERTION N/A 12/19/2020   Procedure: PACEMAKER LEADLESS INSERTION;  Surgeon: Glenn Brace, MD;  Location: [facility] INVASIVE CV LAB;  Service: Cardiovascular;  Laterality: N/A;   PROSTATE CANCER     PROSTATE SURGERY     RADIOLOGY WITH ANESTHESIA N/A 08/27/2019   Procedure: IR WITH ANESTHESIA;  Surgeon: Glenn Sages, MD;  Location: [facility] OR;  Service: Radiology;  Laterality: N/A;    Medical History: Past Medical History:  Diagnosis Date   Acute deep vein thrombosis (DVT) of femoral vein of left lower extremity (HCC)    Atrial fibrillation (HCC)    CHF (congestive heart failure) (HCC)    Chronic kidney disease    Diabetes (HCC)    Hearing loss  High blood pressure    History of bladder problems    Hyperlipidemia    Kidney stones 11/22/2014   Liver cyst    OSA (obstructive sleep apnea)    Prostate cancer (HCC)    Prostate cancer (HCC)    Stroke (HCC)    Valvular heart disease     Family History: Family History  Problem Relation Age of Onset   Colon cancer Mother    Diabetes Other    High blood pressure Other    Prostate cancer Brother    Diabetes  Brother     Social History   Socioeconomic History   Marital status: Married    Spouse name: [name]   Number of children: 2   Years of education: 12   Highest education level: 12th grade  Occupational History   Occupation: [employer]  Tobacco Use   Smoking status: Former    Types: Cigarettes   Smokeless tobacco: Never  Vaping Use   Vaping status: Never Used  Substance and Sexual Activity   Alcohol use: No   Drug use: No   Sexual activity: Not Currently  Other Topics Concern   Not on file  Social History Narrative   Not on file   Social Drivers of Health   Financial Resource Strain: Low Risk  (10/29/2019)   Overall Financial Resource Strain (CARDIA)    Difficulty of Paying Living Expenses: Not very hard  Recent Concern: Financial Resource Strain - Medium Risk (09/08/2019)   Overall Financial Resource Strain (CARDIA)    Difficulty of Paying Living Expenses: Somewhat hard  Food Insecurity: No Food Insecurity (09/27/2022)   Hunger Vital Sign    Worried About Running Out of Food in the Last Year: Never true    Ran Out of Food in the Last Year: Never true  Transportation Needs: No Transportation Needs (09/27/2022)   PRAPARE - Administrator, Civil Service (Medical): No    Lack of Transportation (Non-Medical): No  Physical Activity: Inactive (10/29/2019)   Exercise Vital Sign    Days of Exercise per Week: 0 days    Minutes of Exercise per Session: 0 min  Stress: No Stress Concern Present (10/29/2019)   [name] of Occupational Health - Occupational Stress Questionnaire    Feeling of Stress : Only a little  Recent Concern: Stress - Stress Concern Present (09/08/2019)   [name] of Occupational Health - Occupational Stress Questionnaire    Feeling of Stress : To some extent  Social Connections: Moderately Integrated (10/29/2019)   Social Connection and Isolation Panel [NHANES]    Frequency of Communication with Friends and Family: More than  three times a week    Frequency of Social Gatherings with Friends and Family: More than three times a week    Attends Religious Services: More than 4 times per year    Active Member of [name] [employer] or Organizations: No    Attends Banker Meetings: Never    Marital Status: Married  Catering manager Violence: Not At Risk (09/22/2022)   Humiliation, Afraid, Rape, and Kick questionnaire    Fear of Current or Ex-Partner: No    Emotionally Abused: No    Physically Abused: No    Sexually Abused: No      Review of Systems  Constitutional:  Positive for fatigue (improving now that the effects of the medication have worn off.). Negative for chills and unexpected weight change.  HENT:  Negative for congestion, postnasal drip, rhinorrhea, sneezing and sore throat.  Eyes:  Negative for redness.  Respiratory: Negative.  Negative for cough, chest tightness, shortness of breath and wheezing.   Cardiovascular: Negative.  Negative for chest pain and palpitations.  Gastrointestinal:  Negative for abdominal pain, constipation, diarrhea, nausea and vomiting.  Musculoskeletal:  Positive for arthralgias, gait problem and joint swelling. Negative for back pain and neck pain.  Skin:  Negative for rash.  Neurological:  Negative for tremors and numbness.  Hematological:  Negative for adenopathy. Bruises/bleeds easily (taking edoxaban ).  Psychiatric/Behavioral:  Negative for behavioral problems (Depression), sleep disturbance and suicidal ideas. The patient is not nervous/anxious.     Vital Signs: BP 109/66   Pulse 88   Temp 98.1 F (36.7 C)   Resp 16   Ht 6' (1.829 m)   Wt 241 lb 12.8 oz (109.7 kg)   SpO2 95%   BMI 32.79 kg/m    Physical Exam Vitals reviewed.  Constitutional:      General: He is not in acute distress.    Appearance: Normal appearance. He is obese. He is not ill-appearing.  HENT:     Head: Normocephalic and atraumatic.  Eyes:     Pupils: Pupils are equal, round, and  reactive to light.  Cardiovascular:     Rate and Rhythm: Normal rate and regular rhythm.  Pulmonary:     Effort: Pulmonary effort is normal. No respiratory distress.  Neurological:     Mental Status: He is alert and oriented to person, place, and time.  Psychiatric:        Mood and Affect: Mood normal.        Behavior: Behavior normal.        Assessment/Plan: 1. Type 2 diabetes mellitus with other specified complication, without long-term current use of insulin  (HCC) (Primary) Continue ozempic  and farxiga  as prescribed.  - Semaglutide ,0.25 or 0.5MG /DOS, (OZEMPIC , 0.25 OR 0.5 MG/DOSE,) 2 MG/3ML SOPN; Inject 0.5 mg into the skin once a week  2. Hypertension associated with diabetes (HCC) Continue metoprolol  and furosemide  as prescribed.   3. Atherosclerosis of aorta (HCC) Continue rosuvastatin as prescribed.    General Counseling: emilian hinnenkamp understanding of the findings of todays visit and agrees with plan of treatment. I have discussed any further diagnostic evaluation that may be needed or ordered today. We also reviewed his medications today. he has been encouraged to call the office with any questions or concerns that should arise related to todays visit.    No orders of the defined types were placed in this encounter.   No orders of the defined types were placed in this encounter.   Return in about 3 months (around 07/08/2023) for F/U, Recheck A1C, Salahuddin Arismendez PCP.   Total time spent:30 Minutes Time spent includes review of chart, medications, test results, and follow up plan with the patient.   [place] Controlled Substance Database was reviewed by me.  This patient was seen by Laurence Pons, FNP-C in collaboration with Dr. Verneta Gone as a part of collaborative care agreement.   Linkoln Alkire R. Bobbi Burow, MSN, FNP-C Internal medicine

## 2023-04-11 ENCOUNTER — Ambulatory Visit: Payer: Medicare Other | Admitting: Podiatry

## 2023-04-18 ENCOUNTER — Ambulatory Visit: Admitting: Podiatry

## 2023-04-20 DIAGNOSIS — G4733 Obstructive sleep apnea (adult) (pediatric): Secondary | ICD-10-CM | POA: Diagnosis not present

## 2023-04-22 ENCOUNTER — Encounter: Payer: Self-pay | Admitting: Nurse Practitioner

## 2023-04-28 ENCOUNTER — Telehealth: Payer: Self-pay | Admitting: *Deleted

## 2023-04-28 NOTE — Telephone Encounter (Signed)
 I spoke to him and he has an appt 4/8 and it is for lab and see dr. Cathie Hoops. There is no appt for te transfusion. I assume  that she will tell him after he get the labs results

## 2023-04-29 ENCOUNTER — Inpatient Hospital Stay (HOSPITAL_BASED_OUTPATIENT_CLINIC_OR_DEPARTMENT_OTHER): Payer: Medicare Other | Admitting: Oncology

## 2023-04-29 ENCOUNTER — Inpatient Hospital Stay: Payer: Medicare Other | Attending: Oncology

## 2023-04-29 ENCOUNTER — Ambulatory Visit: Admitting: Nurse Practitioner

## 2023-04-29 ENCOUNTER — Other Ambulatory Visit (HOSPITAL_COMMUNITY): Payer: Self-pay

## 2023-04-29 ENCOUNTER — Other Ambulatory Visit: Payer: Self-pay

## 2023-04-29 ENCOUNTER — Encounter: Payer: Self-pay | Admitting: Oncology

## 2023-04-29 VITALS — BP 125/82 | HR 86 | Temp 98.4°F | Resp 18 | Ht 72.0 in | Wt 239.7 lb

## 2023-04-29 DIAGNOSIS — E1122 Type 2 diabetes mellitus with diabetic chronic kidney disease: Secondary | ICD-10-CM | POA: Diagnosis not present

## 2023-04-29 DIAGNOSIS — Z7901 Long term (current) use of anticoagulants: Secondary | ICD-10-CM | POA: Diagnosis not present

## 2023-04-29 DIAGNOSIS — Z79899 Other long term (current) drug therapy: Secondary | ICD-10-CM | POA: Diagnosis not present

## 2023-04-29 DIAGNOSIS — N184 Chronic kidney disease, stage 4 (severe): Secondary | ICD-10-CM | POA: Insufficient documentation

## 2023-04-29 DIAGNOSIS — C61 Malignant neoplasm of prostate: Secondary | ICD-10-CM

## 2023-04-29 DIAGNOSIS — I480 Paroxysmal atrial fibrillation: Secondary | ICD-10-CM

## 2023-04-29 LAB — CBC WITH DIFFERENTIAL (CANCER CENTER ONLY)
Abs Immature Granulocytes: 0.02 10*3/uL (ref 0.00–0.07)
Basophils Absolute: 0 10*3/uL (ref 0.0–0.1)
Basophils Relative: 0 %
Eosinophils Absolute: 0.1 10*3/uL (ref 0.0–0.5)
Eosinophils Relative: 2 %
HCT: 47.9 % (ref 39.0–52.0)
Hemoglobin: 15.2 g/dL (ref 13.0–17.0)
Immature Granulocytes: 0 %
Lymphocytes Relative: 33 %
Lymphs Abs: 1.7 10*3/uL (ref 0.7–4.0)
MCH: 30.4 pg (ref 26.0–34.0)
MCHC: 31.7 g/dL (ref 30.0–36.0)
MCV: 95.8 fL (ref 80.0–100.0)
Monocytes Absolute: 0.9 10*3/uL (ref 0.1–1.0)
Monocytes Relative: 18 %
Neutro Abs: 2.5 10*3/uL (ref 1.7–7.7)
Neutrophils Relative %: 47 %
Platelet Count: 189 10*3/uL (ref 150–400)
RBC: 5 MIL/uL (ref 4.22–5.81)
RDW: 14.7 % (ref 11.5–15.5)
WBC Count: 5.3 10*3/uL (ref 4.0–10.5)
nRBC: 0 % (ref 0.0–0.2)

## 2023-04-29 LAB — CMP (CANCER CENTER ONLY)
ALT: 10 U/L (ref 0–44)
AST: 15 U/L (ref 15–41)
Albumin: 3.6 g/dL (ref 3.5–5.0)
Alkaline Phosphatase: 131 U/L — ABNORMAL HIGH (ref 38–126)
Anion gap: 10 (ref 5–15)
BUN: 34 mg/dL — ABNORMAL HIGH (ref 8–23)
CO2: 27 mmol/L (ref 22–32)
Calcium: 9.4 mg/dL (ref 8.9–10.3)
Chloride: 103 mmol/L (ref 98–111)
Creatinine: 2.17 mg/dL — ABNORMAL HIGH (ref 0.61–1.24)
GFR, Estimated: 29 mL/min — ABNORMAL LOW (ref >60)
Glucose, Bld: 84 mg/dL (ref 70–99)
Potassium: 4.1 mmol/L (ref 3.5–5.1)
Sodium: 140 mmol/L (ref 135–145)
Total Bilirubin: 0.9 mg/dL (ref 0.0–1.2)
Total Protein: 7.9 g/dL (ref 6.5–8.1)

## 2023-04-29 LAB — PSA: Prostatic Specific Antigen: 0.33 ng/mL (ref 0.00–4.00)

## 2023-04-29 MED ORDER — ENZALUTAMIDE 40 MG PO TABS
160.0000 mg | ORAL_TABLET | Freq: Every day | ORAL | 2 refills | Status: DC
  Filled 2023-04-29 – 2023-05-02 (×2): qty 120, 30d supply, fill #0
  Filled 2023-05-29: qty 120, 30d supply, fill #1
  Filled 2023-07-16: qty 120, 30d supply, fill #2

## 2023-04-29 NOTE — Progress Notes (Signed)
 Pt here for follow up. No new concerns voiced.

## 2023-04-29 NOTE — Assessment & Plan Note (Signed)
Currently on Edoxaban, follow up with cardiology

## 2023-04-29 NOTE — Assessment & Plan Note (Signed)
Encourage oral hydration and avoid nephrotoxins. Follow up with nephrology
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
## 2023-04-29 NOTE — Assessment & Plan Note (Signed)
 Prostate cancer recurrence.  Non- regional (retroperitoneal) lymphadenopathy -M1a Labs reviewed and discussed with patient.  PSA progressively trending up to 83. 01/05/2021 Bone scan showed No convincing scintigraphic evidence of bony metastatic disease. 01/05/2021 Mild increase in size of left retroperitoneal, right common iliac and right external iliac lymph nodes compatible with progression of disease.No CT findings of osseous metastasis. Check lab cbc cmp PSA  Patient is on androgen deprivation therapy with Eligard 45 mg every 6 months-  next due July 2025 Clinically he tolerates Xtandi 160mg  daily well. Continue current regimen

## 2023-05-02 ENCOUNTER — Other Ambulatory Visit: Payer: Self-pay

## 2023-05-02 NOTE — Progress Notes (Signed)
 Specialty Pharmacy Refill Coordination Note  [name] is a 89 y.o. male contacted today regarding refills of specialty medication(s) Enzalutamide [name])  Spoke to Tiffany  Patient requested Delivery   Delivery date: 05/07/23   Verified address: [address] [place] [address]   Medication will be filled on 04.15.25.

## 2023-05-10 ENCOUNTER — Encounter: Payer: Self-pay | Admitting: Nurse Practitioner

## 2023-05-17 ENCOUNTER — Encounter: Payer: Self-pay | Admitting: Nurse Practitioner

## 2023-05-17 MED ORDER — OZEMPIC (0.25 OR 0.5 MG/DOSE) 2 MG/3ML ~~LOC~~ SOPN: 0.5000 mg | PEN_INJECTOR | SUBCUTANEOUS | Status: AC

## 2023-05-21 DIAGNOSIS — G4733 Obstructive sleep apnea (adult) (pediatric): Secondary | ICD-10-CM | POA: Diagnosis not present

## 2023-05-29 ENCOUNTER — Other Ambulatory Visit: Payer: Self-pay

## 2023-05-29 NOTE — Progress Notes (Signed)
 Specialty Pharmacy Ongoing Clinical Assessment Note  [name] is a 89 y.o. male who is being followed by the specialty pharmacy service for RxSp Oncology   Patient's specialty medication(s) reviewed today: Enzalutamide  (XTANDI )   Missed doses in the last 4 weeks: 0   Patient/Caregiver did not have any additional questions or concerns.   Therapeutic benefit summary: Patient is achieving benefit   Adverse events/side effects summary: No adverse events/side effects   Patient's therapy is appropriate to: Continue    Goals Addressed             This Visit's Progress    Slow Disease Progression       Patient is on track. Patient will maintain adherence.  [name]'s PSA continues to decline and was most recently 0.33 on 04/29/23.         Follow up: 6 months  Yolander Goodie M Maguire Sime Specialty Pharmacist

## 2023-05-29 NOTE — Progress Notes (Signed)
 Specialty Pharmacy Refill Coordination Note  [name] is a 89 y.o. male contacted today regarding refills of specialty medication(s) Enzalutamide  (XTANDI )   Patient requested Delivery   Delivery date: 06/03/23   Verified address: [address] [place] [address]   Medication will be filled on 06/02/23.

## 2023-06-02 ENCOUNTER — Telehealth: Payer: Self-pay

## 2023-06-02 DIAGNOSIS — Z79899 Other long term (current) drug therapy: Secondary | ICD-10-CM

## 2023-06-02 DIAGNOSIS — E1165 Type 2 diabetes mellitus with hyperglycemia: Secondary | ICD-10-CM

## 2023-06-02 NOTE — Progress Notes (Signed)
   06/02/2023  Patient ID: [name], male   DOB: 09-03-1933, 89 y.o.   MRN: [id number]  Attempted to contact patient for medication management/review. I was unable to leave HIPAA compliant message for patient to return my call at their convenience.   First attempt for patient outreach. Will follow up with patient in 2 weeks. Per Dr. Anson Basta, patient adherence rate >86% for all medications except metoprolol . Per chart review, patient prescribed Ozempic  0.5 mg weekly on 05/17/2023 via medication assistance. Upcoming appointment on 07/10/2023. Will follow up to see if dose adjustment is needed.   Thank you for allowing pharmacy to be a part of this patient's care.  Alexandria Angel, PharmD Clinical Pharmacist Cell: [phone]

## 2023-06-20 ENCOUNTER — Telehealth: Payer: Self-pay

## 2023-06-20 DIAGNOSIS — Z79899 Other long term (current) drug therapy: Secondary | ICD-10-CM

## 2023-06-20 DIAGNOSIS — G4733 Obstructive sleep apnea (adult) (pediatric): Secondary | ICD-10-CM | POA: Diagnosis not present

## 2023-06-20 NOTE — Progress Notes (Signed)
 06/20/2023 Name: [name] MRN: [id number] DOB: Oct 07, 1933  No chief complaint on file.   [name] is a 89 y.o. year old male who presented for a telephone visit.   They were referred to the pharmacist by a quality report for assistance in managing diabetes. Patient's wife was accompanied on the telephone call as well.    Subjective:  Care Team: Primary Care Provider: Laurence Pons, NP ; Next Scheduled Visit: 07/10/2023   Medication Access/Adherence  Current Pharmacy:  [id number] [place], [place] - [address] AT [facility] [address] [place] [address] [place] [address] Phone: [phone] Fax: [phone]  [place] - [facility] [address] [place] [address] Phone: [phone] Fax: [phone]   Patient reports affordability concerns with their medications: No  Patient reports access/transportation concerns to their pharmacy: No  Patient reports adherence concerns with their medications:  No   - Uses pillbox    Diabetes:  Current medications: Farxiga  10 mg daily, Ozempic  0.25 mg weekly (per patient) - PCP recently increased to 0.5 mg weekly on 04/07/2023. He feels that his BG has "been looking good with the shot in my stomach on that dose". Supportive medications: Atorvastatin  10 mg daily  Current glucose readings: 111-134; though he reports that BG was 97 Tuesday 125, 133 Today 113  Using BGM meter; testing one time daily    Patient denies hypoglycemic s/sx including dizziness, shakiness, sweating. Patient denies hyperglycemic symptoms including polyuria, polydipsia, polyphagia, nocturia, neuropathy, blurred vision.  Current meal patterns: avoids red meat per his wife advice  - Breakfast: oatmeal, egg, cereal - Lunch/supper: chicken/fish, salad, string beans, macaroni and cheese (about once a week) - Sprite sodas on occasionally       Objective:  Lab Results  Component  Value Date   HGBA1C 8.7 (A) 03/10/2023    Lab Results  Component Value Date   CREATININE 2.17 (H) 04/29/2023   BUN 34 (H) 04/29/2023   NA 140 04/29/2023   K 4.1 04/29/2023   CL 103 04/29/2023   CO2 27 04/29/2023    Lab Results  Component Value Date   CHOL 78 08/28/2019   HDL 42 08/28/2019   LDLCALC 27 08/28/2019   TRIG 44 08/28/2019   CHOLHDL 1.9 08/28/2019    Medications Reviewed Today     Reviewed by [name], RPH (Pharmacist) on 06/20/23 at 1337  Med List Status: <None>   Medication Order Taking? Sig Documenting Provider Last Dose Status Informant  albuterol  (VENTOLIN  HFA) 108 (90 Base) MCG/ACT inhaler [id number] Yes Inhale 2 puffs into the lungs every 4 (four) hours as needed for wheezing or shortness of breath. Glenn Pons, NP Taking Active   atorvastatin  (LIPITOR) 10 MG tablet [id number] Yes TAKE 1 TABLET(10 MG) BY MOUTH DAILY Abernathy, Alyssa, NP Taking Active   Cholecalciferol (VITAMIN D-3 PO) [id number] Yes Take 1 capsule by mouth daily. [provider] Taking Active   dapagliflozin  propanediol (FARXIGA ) 10 MG TABS tablet [id number] Yes Take 1 tablet (10 mg total) by mouth daily. Glenn Pons, NP Taking Active   edoxaban  (SAVAYSA ) 30 MG TABS tablet [id number] Yes Take 30 mg by mouth daily. [provider] Taking Active Pharmacy Records, Spouse/Significant Other  enzalutamide  (XTANDI ) 40 MG tablet [id number]  Take 4 tablets (160 mg total) by mouth daily. Glenn Forbes, MD  Active   ferrous sulfate  [facility]) 325 (65 FE) MG tablet [id number]  Take 1  tablet by mouth daily. [provider]  Active   furosemide  (LASIX ) 20 MG tablet [id number] Yes Take 1 tablet (20 mg total) by mouth daily. Glenn Pons, NP Taking Active   glucose blood (ONETOUCH VERIO) test strip [id number]  USE ONE STRIP DAILY Abernathy, Alyssa, NP  Active   Metoprolol  Tartrate 37.5 MG TABS [id number] Yes Take 1 tablet (37.5 mg total) by mouth 2 (two) times daily.  Glenn Pons, NP Taking Active   pantoprazole  (PROTONIX ) 40 MG tablet [id number]  Take 1 tablet (40 mg total) by mouth daily. Glenn Pons, NP  Active   Semaglutide ,0.25 or 0.5MG /DOS, (OZEMPIC , 0.25 OR 0.5 MG/DOSE,) 2 MG/3ML SOPN [id number] Yes Inject 0.5 mg into the skin once a week. Glenn Pons, NP Taking Active            Med Note [name], [facility] R   Fri Jun 20, 2023  1:35 PM) Takes on Mondays 0.25 mg weekly and he is using the label on the box  Tiotropium Bromide  Monohydrate (SPIRIVA  RESPIMAT) 2.5 MCG/ACT AERS [id number]  Inhale 2 puffs into the lungs daily. Abernathy, Alyssa, NP  Active   vitamin B-12 (CYANOCOBALAMIN ) 1000 MCG tablet [id number]  Take 1 tablet (1,000 mcg total) by mouth daily. Glenn Pons, NP  Active Pharmacy Records, Spouse/Significant Other           Med Note [name] Oct 03, 2021 10:18 PM)    Med List Note [name], RPH-CPP 11/12/21 1452): Xtandi  filled at [facility] ([facility])          **Unable to complete a full medication review as the patient was unfamiliar with the names of his medications, though he reports taking them as prescribed. He was eventually able to retrieve some of his medications but had difficulty reading the labels or identifying medications when spelled aloud. The patient plans to bring all of his medications to his next primary care appointment for a comprehensive review.    Assessment/Plan:   Diabetes: - Currently uncontrolled based on last hemoglobin A1c 8.7%. However, reported  home blood glucose (fasting) readings appears  with goal (9-150 mg/dL) if Z5G goal < 8%. Informed patient of dose increase in March that was supposed to be for 0.5 mg weekly, but patient was NOT advised to change current regimen (patient preferred that I discuss with PCP as well). Patient was educated that Ozempic  0.25 mg once weekly is not a therapeutic dose but he feels it's appropriately controlling his diabetes.   - Reviewed long term cardiovascular and renal outcomes of uncontrolled blood sugar - Reviewed goal A1c, goal fasting, and goal 2 hour post prandial glucose - Recommend to continue current regimen. Will discuss current medication dose of Ozempic  with PCP.   - Recommend to check glucose once daily.      Follow Up Plan: 4-6 weeks with Pharmacist; Next PCP appointment on 07/10/2023  Alexandria Angel, PharmD Clinical Pharmacist Cell: [phone]

## 2023-06-23 ENCOUNTER — Inpatient Hospital Stay
Admission: EM | Admit: 2023-06-23 | Discharge: 2023-06-27 | DRG: 286 | Disposition: A | Discharge status: Home Health | Attending: Internal Medicine | Admitting: Internal Medicine

## 2023-06-23 ENCOUNTER — Emergency Department

## 2023-06-23 ENCOUNTER — Other Ambulatory Visit: Payer: Self-pay

## 2023-06-23 ENCOUNTER — Encounter: Payer: Self-pay | Admitting: Internal Medicine

## 2023-06-23 DIAGNOSIS — Z950 Presence of cardiac pacemaker: Secondary | ICD-10-CM

## 2023-06-23 DIAGNOSIS — I70222 Atherosclerosis of native arteries of extremities with rest pain, left leg: Secondary | ICD-10-CM | POA: Diagnosis not present

## 2023-06-23 DIAGNOSIS — C799 Secondary malignant neoplasm of unspecified site: Secondary | ICD-10-CM | POA: Diagnosis present

## 2023-06-23 DIAGNOSIS — Z79899 Other long term (current) drug therapy: Secondary | ICD-10-CM | POA: Diagnosis not present

## 2023-06-23 DIAGNOSIS — I9589 Other hypotension: Secondary | ICD-10-CM | POA: Diagnosis present

## 2023-06-23 DIAGNOSIS — I472 Ventricular tachycardia, unspecified: Secondary | ICD-10-CM | POA: Diagnosis not present

## 2023-06-23 DIAGNOSIS — I4821 Permanent atrial fibrillation: Secondary | ICD-10-CM | POA: Diagnosis not present

## 2023-06-23 DIAGNOSIS — R4182 Altered mental status, unspecified: Secondary | ICD-10-CM

## 2023-06-23 DIAGNOSIS — I4729 Other ventricular tachycardia: Principal | ICD-10-CM | POA: Diagnosis present

## 2023-06-23 DIAGNOSIS — E119 Type 2 diabetes mellitus without complications: Secondary | ICD-10-CM | POA: Diagnosis not present

## 2023-06-23 DIAGNOSIS — I130 Hypertensive heart and chronic kidney disease with heart failure and stage 1 through stage 4 chronic kidney disease, or unspecified chronic kidney disease: Secondary | ICD-10-CM | POA: Diagnosis not present

## 2023-06-23 DIAGNOSIS — Z5986 Financial insecurity: Secondary | ICD-10-CM

## 2023-06-23 DIAGNOSIS — I10 Essential (primary) hypertension: Secondary | ICD-10-CM | POA: Diagnosis not present

## 2023-06-23 DIAGNOSIS — Z87891 Personal history of nicotine dependence: Secondary | ICD-10-CM | POA: Diagnosis not present

## 2023-06-23 DIAGNOSIS — I2510 Atherosclerotic heart disease of native coronary artery without angina pectoris: Secondary | ICD-10-CM | POA: Diagnosis not present

## 2023-06-23 DIAGNOSIS — I495 Sick sinus syndrome: Secondary | ICD-10-CM | POA: Diagnosis present

## 2023-06-23 DIAGNOSIS — R0689 Other abnormalities of breathing: Secondary | ICD-10-CM | POA: Diagnosis not present

## 2023-06-23 DIAGNOSIS — Z8673 Personal history of transient ischemic attack (TIA), and cerebral infarction without residual deficits: Secondary | ICD-10-CM

## 2023-06-23 DIAGNOSIS — Z7951 Long term (current) use of inhaled steroids: Secondary | ICD-10-CM

## 2023-06-23 DIAGNOSIS — I5033 Acute on chronic diastolic (congestive) heart failure: Secondary | ICD-10-CM

## 2023-06-23 DIAGNOSIS — R000 Tachycardia, unspecified: Secondary | ICD-10-CM | POA: Diagnosis not present

## 2023-06-23 DIAGNOSIS — J449 Chronic obstructive pulmonary disease, unspecified: Secondary | ICD-10-CM | POA: Diagnosis not present

## 2023-06-23 DIAGNOSIS — R079 Chest pain, unspecified: Secondary | ICD-10-CM | POA: Diagnosis not present

## 2023-06-23 DIAGNOSIS — G4733 Obstructive sleep apnea (adult) (pediatric): Secondary | ICD-10-CM | POA: Diagnosis not present

## 2023-06-23 DIAGNOSIS — R0989 Other specified symptoms and signs involving the circulatory and respiratory systems: Secondary | ICD-10-CM | POA: Diagnosis not present

## 2023-06-23 DIAGNOSIS — R008 Other abnormalities of heart beat: Secondary | ICD-10-CM | POA: Diagnosis not present

## 2023-06-23 DIAGNOSIS — C61 Malignant neoplasm of prostate: Secondary | ICD-10-CM | POA: Diagnosis present

## 2023-06-23 DIAGNOSIS — Z833 Family history of diabetes mellitus: Secondary | ICD-10-CM

## 2023-06-23 DIAGNOSIS — I482 Chronic atrial fibrillation, unspecified: Secondary | ICD-10-CM | POA: Diagnosis present

## 2023-06-23 DIAGNOSIS — Z8042 Family history of malignant neoplasm of prostate: Secondary | ICD-10-CM

## 2023-06-23 DIAGNOSIS — Z7982 Long term (current) use of aspirin: Secondary | ICD-10-CM | POA: Diagnosis not present

## 2023-06-23 DIAGNOSIS — I5043 Acute on chronic combined systolic (congestive) and diastolic (congestive) heart failure: Secondary | ICD-10-CM | POA: Diagnosis not present

## 2023-06-23 DIAGNOSIS — Z800 Family history of malignant neoplasm of digestive organs: Secondary | ICD-10-CM

## 2023-06-23 DIAGNOSIS — T68XXXA Hypothermia, initial encounter: Secondary | ICD-10-CM

## 2023-06-23 DIAGNOSIS — N184 Chronic kidney disease, stage 4 (severe): Secondary | ICD-10-CM | POA: Diagnosis present

## 2023-06-23 DIAGNOSIS — Z7985 Long-term (current) use of injectable non-insulin antidiabetic drugs: Secondary | ICD-10-CM

## 2023-06-23 DIAGNOSIS — Z86718 Personal history of other venous thrombosis and embolism: Secondary | ICD-10-CM | POA: Diagnosis not present

## 2023-06-23 DIAGNOSIS — E785 Hyperlipidemia, unspecified: Secondary | ICD-10-CM | POA: Diagnosis not present

## 2023-06-23 DIAGNOSIS — N1832 Chronic kidney disease, stage 3b: Secondary | ICD-10-CM | POA: Diagnosis present

## 2023-06-23 DIAGNOSIS — K219 Gastro-esophageal reflux disease without esophagitis: Secondary | ICD-10-CM | POA: Diagnosis not present

## 2023-06-23 DIAGNOSIS — I2489 Other forms of acute ischemic heart disease: Secondary | ICD-10-CM | POA: Diagnosis present

## 2023-06-23 DIAGNOSIS — I517 Cardiomegaly: Secondary | ICD-10-CM | POA: Diagnosis not present

## 2023-06-23 DIAGNOSIS — R680 Hypothermia, not associated with low environmental temperature: Secondary | ICD-10-CM | POA: Diagnosis present

## 2023-06-23 DIAGNOSIS — E1122 Type 2 diabetes mellitus with diabetic chronic kidney disease: Secondary | ICD-10-CM

## 2023-06-23 DIAGNOSIS — J9611 Chronic respiratory failure with hypoxia: Secondary | ICD-10-CM | POA: Diagnosis present

## 2023-06-23 DIAGNOSIS — R0902 Hypoxemia: Secondary | ICD-10-CM | POA: Diagnosis not present

## 2023-06-23 DIAGNOSIS — H9190 Unspecified hearing loss, unspecified ear: Secondary | ICD-10-CM | POA: Diagnosis present

## 2023-06-23 DIAGNOSIS — G9341 Metabolic encephalopathy: Secondary | ICD-10-CM | POA: Diagnosis not present

## 2023-06-23 DIAGNOSIS — N189 Chronic kidney disease, unspecified: Secondary | ICD-10-CM | POA: Diagnosis not present

## 2023-06-23 DIAGNOSIS — I4891 Unspecified atrial fibrillation: Secondary | ICD-10-CM | POA: Diagnosis not present

## 2023-06-23 DIAGNOSIS — I214 Non-ST elevation (NSTEMI) myocardial infarction: Secondary | ICD-10-CM | POA: Diagnosis not present

## 2023-06-23 DIAGNOSIS — I493 Ventricular premature depolarization: Secondary | ICD-10-CM | POA: Diagnosis present

## 2023-06-23 DIAGNOSIS — I959 Hypotension, unspecified: Secondary | ICD-10-CM | POA: Diagnosis not present

## 2023-06-23 LAB — CBC WITH DIFFERENTIAL/PLATELET
Abs Immature Granulocytes: 0.01 10*3/uL (ref 0.00–0.07)
Basophils Absolute: 0 10*3/uL (ref 0.0–0.1)
Basophils Relative: 0 %
Eosinophils Absolute: 0.2 10*3/uL (ref 0.0–0.5)
Eosinophils Relative: 3 %
HCT: 43.2 % (ref 39.0–52.0)
Hemoglobin: 14 g/dL (ref 13.0–17.0)
Immature Granulocytes: 0 %
Lymphocytes Relative: 37 %
Lymphs Abs: 2 10*3/uL (ref 0.7–4.0)
MCH: 31.1 pg (ref 26.0–34.0)
MCHC: 32.4 g/dL (ref 30.0–36.0)
MCV: 96 fL (ref 80.0–100.0)
Monocytes Absolute: 0.8 10*3/uL (ref 0.1–1.0)
Monocytes Relative: 14 %
Neutro Abs: 2.4 10*3/uL (ref 1.7–7.7)
Neutrophils Relative %: 46 %
Platelets: 166 10*3/uL (ref 150–400)
RBC: 4.5 MIL/uL (ref 4.22–5.81)
RDW: 15 % (ref 11.5–15.5)
WBC: 5.4 10*3/uL (ref 4.0–10.5)
nRBC: 0 % (ref 0.0–0.2)

## 2023-06-23 LAB — COMPREHENSIVE METABOLIC PANEL WITH GFR
ALT: 11 U/L (ref 0–44)
AST: 19 U/L (ref 15–41)
Albumin: 3 g/dL — ABNORMAL LOW (ref 3.5–5.0)
Alkaline Phosphatase: 107 U/L (ref 38–126)
Anion gap: 11 (ref 5–15)
BUN: 28 mg/dL — ABNORMAL HIGH (ref 8–23)
CO2: 19 mmol/L — ABNORMAL LOW (ref 22–32)
Calcium: 8.5 mg/dL — ABNORMAL LOW (ref 8.9–10.3)
Chloride: 110 mmol/L (ref 98–111)
Creatinine, Ser: 2 mg/dL — ABNORMAL HIGH (ref 0.61–1.24)
GFR, Estimated: 32 mL/min — ABNORMAL LOW (ref >60)
Glucose, Bld: 175 mg/dL — ABNORMAL HIGH (ref 70–99)
Potassium: 3.7 mmol/L (ref 3.5–5.1)
Sodium: 140 mmol/L (ref 135–145)
Total Bilirubin: 0.6 mg/dL (ref 0.0–1.2)
Total Protein: 6.4 g/dL — ABNORMAL LOW (ref 6.5–8.1)

## 2023-06-23 LAB — URINALYSIS, ROUTINE W REFLEX MICROSCOPIC
Bacteria, UA: NONE SEEN
Bilirubin Urine: NEGATIVE
Glucose, UA: >=500 mg/dL — AB
Ketones, ur: NEGATIVE mg/dL
Leukocytes,Ua: NEGATIVE
Nitrite: NEGATIVE
Protein, ur: NEGATIVE mg/dL
Specific Gravity, Urine: 1.002 — ABNORMAL LOW (ref 1.005–1.030)
Squamous Epithelial / HPF: 0 /HPF (ref 0–5)
pH: 5 (ref 5.0–8.0)

## 2023-06-23 LAB — APTT: aPTT: 27 s (ref 24–36)

## 2023-06-23 LAB — TROPONIN I (HIGH SENSITIVITY)
Troponin I (High Sensitivity): 16 ng/L (ref <18)
Troponin I (High Sensitivity): 177 ng/L (ref <18)

## 2023-06-23 LAB — MRSA NEXT GEN BY PCR, NASAL: MRSA by PCR Next Gen: NOT DETECTED

## 2023-06-23 LAB — HEPARIN LEVEL (UNFRACTIONATED): Heparin Unfractionated: 0.88 [IU]/mL — ABNORMAL HIGH (ref 0.30–0.70)

## 2023-06-23 LAB — GLUCOSE, CAPILLARY: Glucose-Capillary: 137 mg/dL — ABNORMAL HIGH (ref 70–99)

## 2023-06-23 LAB — MAGNESIUM: Magnesium: 2 mg/dL (ref 1.7–2.4)

## 2023-06-23 LAB — BRAIN NATRIURETIC PEPTIDE: B Natriuretic Peptide: 495 pg/mL — ABNORMAL HIGH (ref 0.0–100.0)

## 2023-06-23 MED ORDER — ONDANSETRON HCL 4 MG PO TABS: 4.0000 mg | ORAL_TABLET | Freq: Four times a day (QID) | ORAL | Status: DC | PRN

## 2023-06-23 MED ORDER — FENTANYL CITRATE PF 50 MCG/ML IJ SOSY
PREFILLED_SYRINGE | INTRAMUSCULAR | Status: AC | PRN
  Administered 2023-06-23: 75 ug via INTRAVENOUS

## 2023-06-23 MED ORDER — ALBUTEROL SULFATE (2.5 MG/3ML) 0.083% IN NEBU: 2.5000 mg | INHALATION_SOLUTION | RESPIRATORY_TRACT | Status: DC | PRN

## 2023-06-23 MED ORDER — HEPARIN (PORCINE) 25000 UT/250ML-% IV SOLN
1300.0000 [IU]/h | INTRAVENOUS | Status: DC
  Administered 2023-06-23: 1400 [IU]/h via INTRAVENOUS
  Filled 2023-06-23: qty 250

## 2023-06-23 MED ORDER — AMIODARONE HCL IN DEXTROSE 360-4.14 MG/200ML-% IV SOLN
60.0000 mg/h | INTRAVENOUS | Status: DC
  Administered 2023-06-23: 60 mg/h via INTRAVENOUS
  Filled 2023-06-23: qty 200

## 2023-06-23 MED ORDER — FUROSEMIDE 10 MG/ML IJ SOLN
40.0000 mg | Freq: Once | INTRAMUSCULAR | Status: AC
  Administered 2023-06-23: 40 mg via INTRAVENOUS
  Filled 2023-06-23: qty 4

## 2023-06-23 MED ORDER — UMECLIDINIUM BROMIDE 62.5 MCG/ACT IN AEPB
1.0000 | INHALATION_SPRAY | Freq: Every day | RESPIRATORY_TRACT | Status: DC
  Administered 2023-06-24 – 2023-06-27 (×4): 1 via RESPIRATORY_TRACT
  Filled 2023-06-23: qty 7

## 2023-06-23 MED ORDER — POLYETHYLENE GLYCOL 3350 17 G PO PACK: 17.0000 g | PACK | Freq: Every day | ORAL | Status: DC | PRN

## 2023-06-23 MED ORDER — INSULIN ASPART 100 UNIT/ML IJ SOLN
0.0000 [IU] | Freq: Three times a day (TID) | INTRAMUSCULAR | Status: DC
  Administered 2023-06-25: 2 [IU] via SUBCUTANEOUS
  Administered 2023-06-25: 3 [IU] via SUBCUTANEOUS
  Filled 2023-06-23 (×2): qty 1

## 2023-06-23 MED ORDER — ATORVASTATIN CALCIUM 10 MG PO TABS
10.0000 mg | ORAL_TABLET | Freq: Every day | ORAL | Status: DC
  Administered 2023-06-24 – 2023-06-27 (×4): 10 mg via ORAL
  Filled 2023-06-23 (×4): qty 1

## 2023-06-23 MED ORDER — FERROUS SULFATE 325 (65 FE) MG PO TABS
325.0000 mg | ORAL_TABLET | Freq: Every day | ORAL | Status: DC
  Administered 2023-06-24 – 2023-06-27 (×4): 325 mg via ORAL
  Filled 2023-06-23 (×4): qty 1

## 2023-06-23 MED ORDER — SODIUM CHLORIDE 0.9% FLUSH
3.0000 mL | Freq: Two times a day (BID) | INTRAVENOUS | Status: DC
  Administered 2023-06-23 – 2023-06-27 (×7): 3 mL via INTRAVENOUS

## 2023-06-23 MED ORDER — POTASSIUM CHLORIDE CRYS ER 20 MEQ PO TBCR
40.0000 meq | EXTENDED_RELEASE_TABLET | Freq: Once | ORAL | Status: AC
  Administered 2023-06-23: 40 meq via ORAL
  Filled 2023-06-23: qty 2

## 2023-06-23 MED ORDER — LACTATED RINGERS IV BOLUS
1000.0000 mL | Freq: Once | INTRAVENOUS | Status: AC
  Administered 2023-06-23: 1000 mL via INTRAVENOUS

## 2023-06-23 MED ORDER — ACETAMINOPHEN 325 MG PO TABS
650.0000 mg | ORAL_TABLET | Freq: Four times a day (QID) | ORAL | Status: DC | PRN
  Administered 2023-06-26: 650 mg via ORAL
  Filled 2023-06-23: qty 2

## 2023-06-23 MED ORDER — ACETAMINOPHEN 650 MG RE SUPP
650.0000 mg | Freq: Four times a day (QID) | RECTAL | Status: DC | PRN
Start: 2023-06-23 — End: 2023-06-26

## 2023-06-23 MED ORDER — ASPIRIN 81 MG PO TBEC
81.0000 mg | DELAYED_RELEASE_TABLET | Freq: Every day | ORAL | Status: DC
  Administered 2023-06-24 – 2023-06-27 (×4): 81 mg via ORAL
  Filled 2023-06-23 (×4): qty 1

## 2023-06-23 MED ORDER — ONDANSETRON HCL 4 MG/2ML IJ SOLN
4.0000 mg | Freq: Four times a day (QID) | INTRAMUSCULAR | Status: DC | PRN
Start: 2023-06-23 — End: 2023-06-27

## 2023-06-23 MED ORDER — PANTOPRAZOLE SODIUM 40 MG PO TBEC
40.0000 mg | DELAYED_RELEASE_TABLET | Freq: Every day | ORAL | Status: DC
  Administered 2023-06-24 – 2023-06-27 (×4): 40 mg via ORAL
  Filled 2023-06-23 (×4): qty 1

## 2023-06-23 MED ORDER — ENOXAPARIN SODIUM 60 MG/0.6ML IJ SOSY
0.5000 mg/kg | PREFILLED_SYRINGE | INTRAMUSCULAR | Status: DC
  Filled 2023-06-23: qty 0.6

## 2023-06-23 MED ORDER — ENZALUTAMIDE 40 MG PO TABS: 160.0000 mg | ORAL_TABLET | Freq: Every day | ORAL | Status: DC

## 2023-06-23 MED ORDER — AMIODARONE HCL IN DEXTROSE 360-4.14 MG/200ML-% IV SOLN
30.0000 mg/h | INTRAVENOUS | Status: DC
  Administered 2023-06-24 – 2023-06-25 (×5): 30 mg/h via INTRAVENOUS
  Filled 2023-06-23 (×5): qty 200

## 2023-06-23 MED ORDER — TIOTROPIUM BROMIDE MONOHYDRATE 2.5 MCG/ACT IN AERS: 2.0000 | INHALATION_SPRAY | Freq: Every day | RESPIRATORY_TRACT | Status: DC

## 2023-06-23 NOTE — Assessment & Plan Note (Signed)
-   Hold home Ozempic  and Farxiga  - SSI, moderate

## 2023-06-23 NOTE — ED Triage Notes (Signed)
 BIBEMS, c/o AMS. Ems arrived and Pts HR was noted to be in the 180s. Given 6mg  and 12 mg of adenosine, no changes. GCS 15 baseline. GCS 14 at this time. 70/50 BP, 500ml NS given by EMS, 120s systolic. 18G RAC, 18 LFA

## 2023-06-23 NOTE — H&P (Addendum)
 History and Physical    Patient: [name]:[id number] DOB: 1933/01/27 DOA: 06/23/2023 DOS: the patient was seen and examined on 06/23/2023 PCP: Laurence Pons, NP  Patient coming from: Home  Chief Complaint:  Chief Complaint  Patient presents with   Altered Mental Status   HPI: [name] is a 89 y.o. male with medical history significant of permanent atrial fibrillation on edoxaban , sick sinus syndrome s/p PPM, HFpEF with last EF of 55-60%, CKD stage IIIb, hypertension, hyperlipidemia, type 2 diabetes, stage IVb metastatic prostate cancer on Xtandi  and leuprolide , DVT who presents to the ED due to altered mental status.  Mr. Neumeister states he was in his normal state of health earlier today when he went to work.  Shortly after returning home, he noticed that he was not feeling well but could not pinpoint any particular symptoms.  His wife was also concerned that he was not behaving like himself, so she called the ambulance.  He notes that occasionally at work, he experiences shortness of breath with exertion that resolves with rest, however this has not occurred recently.  He denies any chest pain, lower extremity swelling, nausea, vomiting, fever, dysuria.  He denies any recent illnesses.  ED course: On arrival to the ED, patient was hypotensive at 74/60 with heart rate of 180.  He was saturating at 95% on room air.  Rectal temperature of 94.7.  Due to wide-complex tachycardia on EKG, patient underwent cardioversion with improvement in blood pressure up to 131/74.  Initial workup notable for unremarkable CBC, bicarb 19, glucose 175, BUN 28, creatinine 2, and GFR of 32.  BNP 495.  Troponin negative at 16.  Chest x-ray with cardiomegaly and mild vascular congestion.  Patient given Lasix  and TRH contacted for admission.   Review of Systems: As mentioned in the history of present illness. All other systems reviewed and are negative.  Past Medical History:  Diagnosis Date    Acute deep vein thrombosis (DVT) of femoral vein of left lower extremity (HCC)    Atrial fibrillation (HCC)    CHF (congestive heart failure) (HCC)    Chronic kidney disease    Diabetes (HCC)    Hearing loss    High blood pressure    History of bladder problems    Hyperlipidemia    Kidney stones 11/22/2014   Liver cyst    OSA (obstructive sleep apnea)    Prostate cancer (HCC)    Prostate cancer (HCC)    Stroke (HCC)    Valvular heart disease    Past Surgical History:  Procedure Laterality Date   CATARACT EXTRACTION     COLONOSCOPY WITH PROPOFOL  N/A 07/02/2019   Procedure: COLONOSCOPY WITH PROPOFOL ;  Surgeon: Selena Daily, MD;  Location: [facility] ENDOSCOPY;  Service: Gastroenterology;  Laterality: N/A;   ESOPHAGOGASTRODUODENOSCOPY (EGD) WITH PROPOFOL  N/A 03/19/2022   Procedure: ESOPHAGOGASTRODUODENOSCOPY (EGD) WITH PROPOFOL ;  Surgeon: Luke Salaam, MD;  Location: [facility] ENDOSCOPY;  Service: Gastroenterology;  Laterality: N/A;   IR CT HEAD LTD  08/27/2019   IR PERCUTANEOUS ART THROMBECTOMY/INFUSION INTRACRANIAL INC DIAG ANGIO  08/27/2019       IR PERCUTANEOUS ART THROMBECTOMY/INFUSION INTRACRANIAL INC DIAG ANGIO  08/27/2019   LOWER EXTREMITY ANGIOGRAPHY Left 12/25/2020   Procedure: Lower Extremity Angiography;  Surgeon: Celso College, MD;  Location: [facility] INVASIVE CV LAB;  Service: Cardiovascular;  Laterality: Left;   PACEMAKER LEADLESS INSERTION N/A 12/19/2020   Procedure: PACEMAKER LEADLESS INSERTION;  Surgeon: Percival Brace, MD;  Location: [facility] INVASIVE CV LAB;  Service: Cardiovascular;  Laterality: N/A;   PROSTATE CANCER     PROSTATE SURGERY     RADIOLOGY WITH ANESTHESIA N/A 08/27/2019   Procedure: IR WITH ANESTHESIA;  Surgeon: Luellen Sages, MD;  Location: [facility] OR;  Service: Radiology;  Laterality: N/A;   Social History:  reports that he has quit smoking. His smoking use included cigarettes. He has never used smokeless tobacco. He reports that he does not drink alcohol and does  not use drugs.  No Known Allergies  Family History  Problem Relation Age of Onset   Colon cancer Mother    Diabetes Other    High blood pressure Other    Prostate cancer Brother    Diabetes Brother     Prior to Admission medications   Medication Sig Start Date End Date Taking? Authorizing Provider  albuterol  (VENTOLIN  HFA) 108 (90 Base) MCG/ACT inhaler Inhale 2 puffs into the lungs every 4 (four) hours as needed for wheezing or shortness of breath. 12/26/22   Laurence Pons, NP  atorvastatin  (LIPITOR) 10 MG tablet TAKE 1 TABLET(10 MG) BY MOUTH DAILY 12/26/22   Laurence Pons, NP  Cholecalciferol (VITAMIN D-3 PO) Take 1 capsule by mouth daily.    [provider]  dapagliflozin  propanediol (FARXIGA ) 10 MG TABS tablet Take 1 tablet (10 mg total) by mouth daily. 12/26/22   Laurence Pons, NP  edoxaban  (SAVAYSA ) 30 MG TABS tablet Take 30 mg by mouth daily. 11/01/21   [provider]  enzalutamide  (XTANDI ) 40 MG tablet Take 4 tablets (160 mg total) by mouth daily. 04/29/23   Timmy Forbes, MD  ferrous sulfate  (FEROSUL) 325 (65 FE) MG tablet Take 1 tablet by mouth daily. 02/02/21   [provider]  furosemide  (LASIX ) 20 MG tablet Take 1 tablet (20 mg total) by mouth daily. 12/26/22   Laurence Pons, NP  glucose blood (ONETOUCH VERIO) test strip USE ONE STRIP DAILY 01/27/23   Laurence Pons, NP  Metoprolol  Tartrate 37.5 MG TABS Take 1 tablet (37.5 mg total) by mouth 2 (two) times daily. 12/26/22   Laurence Pons, NP  pantoprazole  (PROTONIX ) 40 MG tablet Take 1 tablet (40 mg total) by mouth daily. 12/26/22 12/26/23  Laurence Pons, NP  Semaglutide ,0.25 or 0.5MG /DOS, (OZEMPIC , 0.25 OR 0.5 MG/DOSE,) 2 MG/3ML SOPN Inject 0.5 mg into the skin once a week. 05/17/23   Abernathy, Alyssa, NP  Tiotropium Bromide  Monohydrate (SPIRIVA  RESPIMAT) 2.5 MCG/ACT AERS Inhale 2 puffs into the lungs daily. 12/26/22   Abernathy, Alyssa, NP  vitamin B-12 (CYANOCOBALAMIN ) 1000 MCG tablet  Take 1 tablet (1,000 mcg total) by mouth daily. 12/08/20   Laurence Pons, NP    Physical Exam: Vitals:   06/23/23 1924 06/23/23 2040 06/23/23 2052 06/23/23 2135  BP: 109/69 137/84    Pulse:  72    Resp: 16 16    Temp: (!) 94.7 F (34.8 C)  (!) 96.7 F (35.9 C) (!) 97.5 F (36.4 C)  TempSrc: Rectal  Oral Axillary  SpO2: 97% 100%  96%  Weight:   107 kg 107.7 kg  Height:    6' (1.829 m)   Physical Exam Vitals and nursing note reviewed.  Constitutional:      General: He is not in acute distress.    Appearance: He is obese.  HENT:     Head: Normocephalic and atraumatic.  Eyes:     Conjunctiva/sclera: Conjunctivae normal.     Pupils: Pupils are equal, round, and reactive to light.  Cardiovascular:     Rate and Rhythm:  Normal rate. Rhythm irregular.     Heart sounds: No murmur heard.    Comments: Multiple PVCs on telemetry during examination Pulmonary:     Effort: Pulmonary effort is normal. No tachypnea or accessory muscle usage.     Breath sounds: Decreased breath sounds (Diminished throughout) and rales (Bibasilar) present. No wheezing or rhonchi.  Abdominal:     General: There is distension.     Palpations: Abdomen is soft.     Tenderness: There is no abdominal tenderness. There is no guarding.  Musculoskeletal:     Right lower leg: 1+ Pitting Edema present.     Left lower leg: 1+ Pitting Edema present.  Skin:    General: Skin is warm and dry.  Neurological:     Mental Status: He is alert.     Comments:  Patient is alert and oriented.  No focal weakness.  No tremor.    Data Reviewed: CBC with WBC of 5.4, hemoglobin of 14.0, platelets of 166 CMP with sodium of 140, potassium 3.7, bicarb 19, glucose 175, BUN 28, creatinine 2.0, AST 19, ALT 11, GFR 32 BNP 495 Troponin 16  Initial EKG reviewed.  Wide-complex tachycardia with rate of 181.  Repeat EKG personally reviewed.  Irregular rhythm consistent with atrial fibrillation with multiple PVCs.  Last EKG personally  reviewed.  Atrial fibrillation with rate of 75.Aaron Aas  Resolution T wave inversion  DG Chest Portable 1 View Result Date: 06/23/2023 CLINICAL DATA:  vtach, cardioverted EXAM: PORTABLE CHEST 1 VIEW COMPARISON:  Chest radiograph 09/21/2022 FINDINGS: Lung volumes remain low. Cardiomegaly is stable. Mediastinal contours are unchanged. Mild vascular congestion. No focal airspace disease. No pneumothorax or large pleural effusion. On limited assessment, no acute osseous findings. IMPRESSION: Low lung volumes with cardiomegaly and mild vascular congestion. Electronically Signed   By: Chadwick Colonel M.D.   On: 06/23/2023 20:04   Results are pending, will review when available.  Assessment and Plan:  * Monomorphic ventricular tachycardia (HCC) S/p cardioversion in the ED with resolution in hypotension and AMS.  Unclear etiology at this time, however patient does have a history of HFpEF; no known history of obstructive CAD.  No acute symptoms at this time to suggest infectious etiology, and no notable electrolyte derangements.  - Cardiology consulted; appreciate their recommendations - After discussion with Dr. Bob Burn, will start amiodarone infusion - Telemetry monitoring - Echocardiogram ordered - S/p K-Dur 40 mEq - Diuresis as noted below  Acute on chronic heart failure with preserved ejection fraction (HFpEF) (HCC) History of HFpEF with last EF of 55-60% in June 2024.  On examination, patient appears hypervolemic with elevated BNP compared to prior.  He reports intermittent dyspnea on exertion at work, but cannot recall any recent episodes.  - S/p Lasix  40 mg once - Assess volume status in the a.m. prior to additional doses - Daily weights - Strict and out - Echocardiogram  NSTEMI (non-ST elevated myocardial infarction) [facility]) Patient has not reported any chest pain or shortness of breath.  Evidence of T wave inversions on initial EKG, with resolution post cardioversion.  Initial troponin of 16 with  increase to 177.  Potentially demand in the setting of cardiogenic hypotension.  - Hold home edoxaban  - Start heparin  infusion per pharmacy dosing - Echocardiogram  Hypothermia Patient was noted to be hypothermic on presentation, with improvement in temperature post cardioversion.  Unclear etiology, as patient does not report any infectious symptoms.  No leukocytosis.  Prostate cancer [facility]) Patient has a history of stage IVb  prostate cancer with regional lymphadenopathy, on Xtandi  and leuprolide .  - Continue home regimen  Chronic a-fib (HCC) Patient has a history of permanent atrial fibrillation, with resumption of atrial fibrillation post cardioversion.  - Resume home metoprolol  tomorrow if blood pressure remains stable - Hold home edoxaban  - Heparin  infusion as noted above  COPD (chronic obstructive pulmonary disease) (HCC) Patient denies any shortness of breath or cough at this time.  - Continue home regimen  OSA (obstructive sleep apnea) - Resume home CPAP at bedtime  Diabetes mellitus (HCC) - Hold home Ozempic  and Farxiga  - SSI, moderate  CKD (chronic kidney disease), stage IV (HCC) Per chart review, patient has a history of CKD stage IIIb-4, with creatinine ranging between 1.7 and 2.3, currently 2.0.  - Daily BMP  Advance Care Planning:   Code Status: Full Code confirmed by patient  Consults: Cardiology  Family Communication: Patient's healthcare power of attorney, Tiffany, updated via telephone per patient's request.  Mr. Belshe notes that his wife has dementia  Severity of Illness: The appropriate patient status for this patient is INPATIENT. Inpatient status is judged to be reasonable and necessary in order to provide the required intensity of service to ensure the patient's safety. The patient's presenting symptoms, physical exam findings, and initial radiographic and laboratory data in the context of their chronic comorbidities is felt to place them at high  risk for further clinical deterioration. Furthermore, it is not anticipated that the patient will be medically stable for discharge from the hospital within 2 midnights of admission.   * I certify that at the point of admission it is my clinical judgment that the patient will require inpatient hospital care spanning beyond 2 midnights from the point of admission due to high intensity of service, high risk for further deterioration and high frequency of surveillance required.*  Author: Avi Body, MD 06/23/2023 9:56 PM  For on call review [email or web address].

## 2023-06-23 NOTE — Assessment & Plan Note (Addendum)
 Patient has a history of permanent atrial fibrillation, with resumption of atrial fibrillation post cardioversion.  - Resume home metoprolol  tomorrow if blood pressure remains stable - Hold home edoxaban  - Heparin  infusion as noted above

## 2023-06-23 NOTE — Assessment & Plan Note (Signed)
-   Resume home CPAP at bedtime

## 2023-06-23 NOTE — ED Notes (Signed)
 [facility] [phone] called in & requested an update on pt

## 2023-06-23 NOTE — Assessment & Plan Note (Signed)
 Patient has not reported any chest pain or shortness of breath.  Evidence of T wave inversions on initial EKG, with resolution post cardioversion.  Initial troponin of 16 with increase to 177.  Potentially demand in the setting of cardiogenic hypotension.  - Hold home edoxaban  - Start heparin  infusion per pharmacy dosing - Echocardiogram

## 2023-06-23 NOTE — Code Documentation (Signed)
 200J shock administered at this time

## 2023-06-23 NOTE — Progress Notes (Signed)
 PHARMACY - ANTICOAGULATION CONSULT NOTE  Pharmacy Consult for Heparin   Indication: chest pain/ACS and atrial fibrillation  No Known Allergies  Patient Measurements: Height: 6' (182.9 cm) Weight: 107.7 kg (237 lb 7 oz) IBW/kg (Calculated) : 77.6 HEPARIN  DW (KG): 100.2  Vital Signs: Temp: 97.5 F (36.4 C) (06/02 2135) Temp Source: Axillary (06/02 2135) BP: 137/84 (06/02 2040) Pulse Rate: 72 (06/02 2040)  Labs: Recent Labs    06/23/23 1857 06/23/23 2044 06/23/23 2218  HGB 14.0  --   --   HCT 43.2  --   --   PLT 166  --   --   APTT  --   --  27  HEPARINUNFRC  --   --  0.88*  CREATININE 2.00*  --   --   TROPONINIHS 16 177*  --     Estimated Creatinine Clearance: 32.4 mL/min (A) (by C-G formula based on SCr of 2 mg/dL (H)).   Medical History: Past Medical History:  Diagnosis Date   Acute deep vein thrombosis (DVT) of femoral vein of left lower extremity (HCC)    Atrial fibrillation (HCC)    CHF (congestive heart failure) (HCC)    Chronic kidney disease    Diabetes (HCC)    Hearing loss    High blood pressure    History of bladder problems    Hyperlipidemia    Kidney stones 11/22/2014   Liver cyst    OSA (obstructive sleep apnea)    Prostate cancer (HCC)    Prostate cancer (HCC)    Stroke (HCC)    Valvular heart disease     Medications:  Medications Prior to Admission  Medication Sig Dispense Refill Last Dose/Taking   albuterol  (VENTOLIN  HFA) 108 (90 Base) MCG/ACT inhaler Inhale 2 puffs into the lungs every 4 (four) hours as needed for wheezing or shortness of breath. 18 g 5 Taking As Needed   atorvastatin  (LIPITOR) 10 MG tablet TAKE 1 TABLET(10 MG) BY MOUTH DAILY 90 tablet 1 Taking   dapagliflozin  propanediol (FARXIGA ) 10 MG TABS tablet Take 1 tablet (10 mg total) by mouth daily. 30 tablet 5 Taking   edoxaban  (SAVAYSA ) 30 MG TABS tablet Take 30 mg by mouth daily.   Taking   enzalutamide  (XTANDI ) 40 MG tablet Take 4 tablets (160 mg total) by mouth daily. 120  tablet 2 Taking   furosemide  (LASIX ) 20 MG tablet Take 1 tablet (20 mg total) by mouth daily. 90 tablet 3 Taking   Metoprolol  Tartrate 37.5 MG TABS Take 1 tablet (37.5 mg total) by mouth 2 (two) times daily. 60 tablet 11 Taking   pantoprazole  (PROTONIX ) 40 MG tablet Take 1 tablet (40 mg total) by mouth daily. 90 tablet 3 Taking   Tiotropium Bromide  Monohydrate (SPIRIVA  RESPIMAT) 2.5 MCG/ACT AERS Inhale 2 puffs into the lungs daily. 4 g 3 Taking   Cholecalciferol (VITAMIN D-3 PO) Take 1 capsule by mouth daily.   Unknown   ferrous sulfate  (FEROSUL) 325 (65 FE) MG tablet Take 1 tablet by mouth daily.   Unknown   glucose blood (ONETOUCH VERIO) test strip USE ONE STRIP DAILY 100 strip 3    Semaglutide ,0.25 or 0.5MG /DOS, (OZEMPIC , 0.25 OR 0.5 MG/DOSE,) 2 MG/3ML SOPN Inject 0.5 mg into the skin once a week.   Unknown   vitamin B-12 (CYANOCOBALAMIN ) 1000 MCG tablet Take 1 tablet (1,000 mcg total) by mouth daily. 30 tablet 0 Unknown    Assessment: Pharmacy consulted to dose heparin  in this 89 year old male admitted with Afib, NSTEMI.  Pt was on Savaysa  (edoxaban ) 30 mg PO daily , last dose on 6/2 AM. CrCl = 32.4 ml/min   Goal of Therapy:  Heparin  level 0.3-0.7 units/ml aPTT 66 - 102  seconds Monitor platelets by anticoagulation protocol: Yes   Plan:  - Pt had dose of Savaysa  30 mg on 6/2 AM ; will not bolus but start drip @ 1400 units/hr - will use aPTT to guide dosing until correlating with HL - will check aPTT and HL 8 hrs after start of drip  - CBC daily   Chaunda Vandergriff D 06/23/2023,10:46 PM

## 2023-06-23 NOTE — Assessment & Plan Note (Signed)
 History of HFpEF with last EF of 55-60% in June 2024.  On examination, patient appears hypervolemic with elevated BNP compared to prior.  He reports intermittent dyspnea on exertion at work, but cannot recall any recent episodes.  - S/p Lasix  40 mg once - Assess volume status in the a.m. prior to additional doses - Daily weights - Strict and out - Echocardiogram

## 2023-06-23 NOTE — Assessment & Plan Note (Addendum)
 S/p cardioversion in the ED with resolution in hypotension and AMS.  Unclear etiology at this time, however patient does have a history of HFpEF; no known history of obstructive CAD.  No acute symptoms at this time to suggest infectious etiology, and no notable electrolyte derangements.  - Cardiology consulted; appreciate their recommendations - After discussion with Dr. Bob Burn, will start amiodarone infusion - Telemetry monitoring - Echocardiogram ordered - S/p K-Dur 40 mEq - Diuresis as noted below

## 2023-06-23 NOTE — Code Documentation (Signed)
 Pts sats dropped to 82%, Placed on 6L [place], sats @92 -99%, Pt needs to be encouraged to take breaths and breathe in nose at this time

## 2023-06-23 NOTE — Assessment & Plan Note (Signed)
 Per chart review, patient has a history of CKD stage IIIb-4, with creatinine ranging between 1.7 and 2.3, currently 2.0.  - Daily BMP

## 2023-06-23 NOTE — ED Notes (Signed)
 2 bags of LR hung at this time, MD Felipe Horton @bedside 

## 2023-06-23 NOTE — Assessment & Plan Note (Signed)
 Patient has a history of stage IVb prostate cancer with regional lymphadenopathy, on Xtandi  and leuprolide .  - Continue home regimen

## 2023-06-23 NOTE — Assessment & Plan Note (Signed)
 Patient denies any shortness of breath or cough at this time.  - Continue home regimen

## 2023-06-23 NOTE — Assessment & Plan Note (Signed)
 Patient was noted to be hypothermic on presentation, with improvement in temperature post cardioversion.  Unclear etiology, as patient does not report any infectious symptoms.  No leukocytosis.

## 2023-06-23 NOTE — ED Provider Notes (Signed)
 [facility] Provider Note    Event Date/Time   First MD Initiated Contact with Patient 06/23/23 1845     (approximate)   History   Altered Mental Status   HPI  [name] is a 89 y.o. male who presents to the ED for evaluation of Altered Mental Status   Reviewed PCP visit from March.  HTN, HLD, GERD, leadless pacemaker 2022 due to SSS and persistent A-fib, edoxaban  for A-fib and DVT. [facility] cardiology visit from 1 year ago.  Permanent A-fib, leadless pacemaker, CKD 4 metastatic prostate cancer EP visit 08/2022.  Patient presents for evaluation of altered mentation.  EMS reports that patient got home from work this evening and was acting oddly in a nonfocal manner to the patient's wife so she called 911.  EMS found the patient tachycardic in the 180s, they trialed adenosine without improvement, started IV fluids.  He is hypotensive.    Here in the ED, history is limited.  He is able to tell us  his name, the year is 2025 and that he does not feel well in a generalized fashion.  Initially denying chest pain but just prior to cardioversion does report chest pain.  Physical Exam   Triage Vital Signs: ED Triage Vitals  Encounter Vitals Group     BP      Systolic BP Percentile      Diastolic BP Percentile      Pulse      Resp      Temp      Temp src      SpO2      Weight      Height      Head Circumference      Peak Flow      Pain Score      Pain Loc      Pain Education      Exclude from Growth Chart     Most recent vital signs: Vitals:   06/23/23 1902 06/23/23 1924  BP:  109/69  Pulse:    Resp:  16  Temp: (!) 95.8 F (35.4 C) (!) 94.7 F (34.8 C)  SpO2:  97%    General: Difficult to arouse, keeping his eyes closed, but does answer some simple questions.  Nonfocal, no signs of trauma CV:  Poor perfusion, hypotensive with a tacky dysrhythmia suspicious for V. tach Resp:  Normal effort.  Abd:  No distention.  MSK:  No deformity  noted.  Neuro:  No focal deficits appreciated. Other:     ED Results / Procedures / Treatments   Labs (all labs ordered are listed, but only abnormal results are displayed) Labs Reviewed  COMPREHENSIVE METABOLIC PANEL WITH GFR - Abnormal; Notable for the following components:      Result Value   CO2 19 (*)    Glucose, Bld 175 (*)    BUN 28 (*)    Creatinine, Ser 2.00 (*)    Calcium  8.5 (*)    Total Protein 6.4 (*)    Albumin  3.0 (*)    GFR, Estimated 32 (*)    All other components within normal limits  BRAIN NATRIURETIC PEPTIDE - Abnormal; Notable for the following components:   B Natriuretic Peptide 495.0 (*)    All other components within normal limits  CBC WITH DIFFERENTIAL/PLATELET  MAGNESIUM   URINALYSIS, ROUTINE W REFLEX MICROSCOPIC  TROPONIN I (HIGH SENSITIVITY)    EKG First EKG with a fairly regular wide-complex tachycardia suspicious for V. tach.  Monomorphic.  After synchronized cardioversion, reverts back to a slower A-fib at a rate of 75 bpm with a left bundle morphology without STEMI by Sgarbossa criteria.  RADIOLOGY 1 view CXR interpreted me with cardiomegaly and pulmonary vascular congestion  Official radiology report(s): No results found.  PROCEDURES and INTERVENTIONS:  .1-3 Lead EKG Interpretation  Performed by: Arline Bennett, MD Authorized by: Arline Bennett, MD     Interpretation: abnormal     ECG rate:  180   ECG rate assessment: tachycardic     Rhythm: ventricular tachycardia     Ectopy: PVCs     Conduction: normal   .Critical Care  Performed by: Arline Bennett, MD Authorized by: Arline Bennett, MD   Critical care provider statement:    Critical care time (minutes):  30   Critical care time was exclusive of:  Separately billable procedures and treating other patients   Critical care was necessary to treat or prevent imminent or life-threatening deterioration of the following conditions:  Cardiac failure and circulatory failure   Critical care  was time spent personally by me on the following activities:  Development of treatment plan with patient or surrogate, discussions with consultants, evaluation of patient's response to treatment, examination of patient, ordering and review of laboratory studies, ordering and review of radiographic studies, ordering and performing treatments and interventions, pulse oximetry, re-evaluation of patient's condition and review of old charts .Cardioversion  Date/Time: 06/23/2023 7:41 PM  Performed by: Arline Bennett, MD Authorized by: Arline Bennett, MD   Consent:    Consent obtained:  Emergent situation Pre-procedure details:    Cardioversion basis:  Emergent   Rhythm:  Ventricular tachycardia   Electrode placement:  Anterior-posterior Patient sedated: No Attempt one:    Cardioversion mode:  Synchronous   Waveform:  Monophasic   Shock (Joules):  200   Shock outcome:  Conversion to other rhythm (baseline afib) Post-procedure details:    Patient status:  Awake   Patient tolerance of procedure:  Tolerated well, no immediate complications   Medications  fentaNYL  (SUBLIMAZE ) injection (75 mcg Intravenous Given 06/23/23 1849)  furosemide  (LASIX ) injection 40 mg (has no administration in time range)  lactated ringers  bolus 1,000 mL (0 mLs Intravenous Stopped 06/23/23 1926)     IMPRESSION / MDM / ASSESSMENT AND PLAN / ED COURSE  I reviewed the triage vital signs and the nursing notes.  Differential diagnosis includes, but is not limited to, metabolic encephalopathy, STEMI, poor perfusion due to cardiac dysrhythmia, sepsis  {Patient presents with symptoms of an acute illness or injury that is potentially life-threatening.  Patient presented to the ED encephalopathic and hypotensive in the setting of monomorphic V. tach, resolving with single synchronized cardioversion and returning back to baseline without persistent hypotension or encephalopathy.  BNP is elevated and he does appear mildly volume  overloaded, we will provide a single dose of IV Lasix .  Electrolytes essentially normal but we will provide a single dose of oral potassium to maintain this in the setting of Lasix  and ventricular tachycardia.  I consult with both on-call cardiology and hospitalist for admission.  No further dysrhythmias  Clinical Course as of 06/23/23 1953  Mon Jun 23, 2023  1610 Persistent hypotension with tachyarrhythmia, provide 75 mcg fentanyl  IV and cardiovert 1 time, synchronized 200 J with improved perfusion, slow rates and improved rhythm. [DS]  1905 Cardiology paged [DS]  1916 Reassessed. Remains stable . [DS]  1930 Still no cardiology callback. Paged again, called listed cell number and secretary left voicemail [  DS]  2956 I consult with Dr. Daneil Dunker, does not necessarily need anticoagulation/heparinization.  Continue home meds, monitor, admit and he can consult in the morning.  Monitor for precipitating causes such as infection. [DS]  1942 Reassessed.  He is back to baseline and reports feeling well until getting home from work today, remembers his wife repeatedly asking him what was wrong.  Reports feeling dizzy, weak and not knowing what happened.  No symptoms while at work today and reports feeling okay right now [DS]  1952 Consult with medicine agrees to admit [DS]    Clinical Course User Index [DS] Arline Bennett, MD     FINAL CLINICAL IMPRESSION(S) / ED DIAGNOSES   Final diagnoses:  V-tach (HCC)  Altered mental status, unspecified altered mental status type     Rx / DC Orders   ED Discharge Orders     None        Note:  This document was prepared using Dragon voice recognition software and may include unintentional dictation errors.   Arline Bennett, MD 06/23/23 [id number]

## 2023-06-24 ENCOUNTER — Encounter: Payer: Self-pay | Admitting: Cardiology

## 2023-06-24 ENCOUNTER — Inpatient Hospital Stay
Admit: 2023-06-24 | Discharge: 2023-06-24 | Disposition: A | Discharge status: Home / Self Care | Attending: Internal Medicine | Admitting: Internal Medicine

## 2023-06-24 ENCOUNTER — Encounter
Admission: EM | Disposition: A | Discharge status: Home Health | Payer: Self-pay | Source: Home / Self Care | Attending: Hospitalist

## 2023-06-24 DIAGNOSIS — I4821 Permanent atrial fibrillation: Secondary | ICD-10-CM

## 2023-06-24 DIAGNOSIS — I214 Non-ST elevation (NSTEMI) myocardial infarction: Secondary | ICD-10-CM | POA: Diagnosis not present

## 2023-06-24 DIAGNOSIS — I5033 Acute on chronic diastolic (congestive) heart failure: Secondary | ICD-10-CM | POA: Diagnosis not present

## 2023-06-24 DIAGNOSIS — I4729 Other ventricular tachycardia: Secondary | ICD-10-CM | POA: Diagnosis not present

## 2023-06-24 HISTORY — PX: LEFT HEART CATH AND CORONARY ANGIOGRAPHY: CATH118249

## 2023-06-24 LAB — GLUCOSE, CAPILLARY
Glucose-Capillary: 108 mg/dL — ABNORMAL HIGH (ref 70–99)
Glucose-Capillary: 106 mg/dL — ABNORMAL HIGH (ref 70–99)
Glucose-Capillary: 104 mg/dL — ABNORMAL HIGH (ref 70–99)
Glucose-Capillary: 118 mg/dL — ABNORMAL HIGH (ref 70–99)

## 2023-06-24 LAB — APTT: aPTT: 106 s — ABNORMAL HIGH (ref 24–36)

## 2023-06-24 LAB — ECHOCARDIOGRAM COMPLETE
AR max vel: 2.24 cm2
AV Area VTI: 2.85 cm2
AV Area mean vel: 2.2 cm2
AV Mean grad: 1.5 mmHg
AV Peak grad: 2.2 mmHg
Ao pk vel: 0.75 m/s
Area-P 1/2: 5.34 cm2
Calc EF: 51.3 %
Height: 72 in
S' Lateral: 3.6 cm
Single Plane A2C EF: 50.3 %
Single Plane A4C EF: 51.6 %
Weight: 3753.11 [oz_av]

## 2023-06-24 LAB — TSH: TSH: 1.983 u[IU]/mL (ref 0.350–4.500)

## 2023-06-24 LAB — CBC
HCT: 45.7 % (ref 39.0–52.0)
Hemoglobin: 14.7 g/dL (ref 13.0–17.0)
MCH: 30.9 pg (ref 26.0–34.0)
MCHC: 32.2 g/dL (ref 30.0–36.0)
MCV: 96 fL (ref 80.0–100.0)
Platelets: 154 10*3/uL (ref 150–400)
RBC: 4.76 MIL/uL (ref 4.22–5.81)
RDW: 15 % (ref 11.5–15.5)
WBC: 4.9 10*3/uL (ref 4.0–10.5)
nRBC: 0 % (ref 0.0–0.2)

## 2023-06-24 LAB — COMPREHENSIVE METABOLIC PANEL WITH GFR
ALT: 16 U/L (ref 0–44)
AST: 26 U/L (ref 15–41)
Albumin: 3.2 g/dL — ABNORMAL LOW (ref 3.5–5.0)
Alkaline Phosphatase: 106 U/L (ref 38–126)
Anion gap: 7 (ref 5–15)
BUN: 28 mg/dL — ABNORMAL HIGH (ref 8–23)
CO2: 29 mmol/L (ref 22–32)
Calcium: 9.1 mg/dL (ref 8.9–10.3)
Chloride: 106 mmol/L (ref 98–111)
Creatinine, Ser: 1.83 mg/dL — ABNORMAL HIGH (ref 0.61–1.24)
GFR, Estimated: 35 mL/min — ABNORMAL LOW (ref >60)
Glucose, Bld: 112 mg/dL — ABNORMAL HIGH (ref 70–99)
Potassium: 3.7 mmol/L (ref 3.5–5.1)
Sodium: 142 mmol/L (ref 135–145)
Total Bilirubin: 0.5 mg/dL (ref 0.0–1.2)
Total Protein: 6.7 g/dL (ref 6.5–8.1)

## 2023-06-24 LAB — MAGNESIUM: Magnesium: 2.1 mg/dL (ref 1.7–2.4)

## 2023-06-24 LAB — HEPARIN LEVEL (UNFRACTIONATED): Heparin Unfractionated: 1.09 [IU]/mL — ABNORMAL HIGH (ref 0.30–0.70)

## 2023-06-24 LAB — TROPONIN I (HIGH SENSITIVITY)
Troponin I (High Sensitivity): 924 ng/L (ref <18)
Troponin I (High Sensitivity): 1144 ng/L (ref <18)
Troponin I (High Sensitivity): 1158 ng/L (ref <18)
Troponin I (High Sensitivity): 927 ng/L (ref <18)

## 2023-06-24 SURGERY — LEFT HEART CATH AND CORONARY ANGIOGRAPHY
Anesthesia: Moderate Sedation

## 2023-06-24 MED ORDER — SODIUM CHLORIDE 0.9% FLUSH
3.0000 mL | INTRAVENOUS | Status: DC | PRN
Start: 2023-06-25 — End: 2023-06-27
  Administered 2023-06-25: 3 mL via INTRAVENOUS

## 2023-06-24 MED ORDER — CHLORHEXIDINE GLUCONATE CLOTH 2 % EX PADS
6.0000 | MEDICATED_PAD | Freq: Every day | CUTANEOUS | Status: DC
  Administered 2023-06-24 – 2023-06-27 (×4): 6 via TOPICAL

## 2023-06-24 MED ORDER — ASPIRIN 81 MG PO CHEW: 81.0000 mg | CHEWABLE_TABLET | ORAL | Status: DC

## 2023-06-24 MED ORDER — SODIUM CHLORIDE 0.9 % IV SOLN: INTRAVENOUS | Status: DC

## 2023-06-24 MED ORDER — SODIUM CHLORIDE 0.9% FLUSH
3.0000 mL | Freq: Two times a day (BID) | INTRAVENOUS | Status: DC
Start: 2023-06-24 — End: 2023-06-24

## 2023-06-24 MED ORDER — LABETALOL HCL 5 MG/ML IV SOLN: 10.0000 mg | INTRAVENOUS | Status: AC | PRN

## 2023-06-24 MED ORDER — MIDAZOLAM HCL 2 MG/2ML IJ SOLN
INTRAMUSCULAR | Status: DC | PRN
  Administered 2023-06-24: .5 mg via INTRAVENOUS

## 2023-06-24 MED ORDER — FENTANYL CITRATE (PF) 100 MCG/2ML IJ SOLN
INTRAMUSCULAR | Status: AC
Start: 2023-06-24 — End: ?
  Filled 2023-06-24: qty 2

## 2023-06-24 MED ORDER — HEPARIN SODIUM (PORCINE) 1000 UNIT/ML IJ SOLN
INTRAMUSCULAR | Status: AC
  Filled 2023-06-24: qty 10

## 2023-06-24 MED ORDER — LIDOCAINE HCL (PF) 1 % IJ SOLN
INTRAMUSCULAR | Status: DC | PRN
  Administered 2023-06-24: 2 mL
  Administered 2023-06-24: 10 mL

## 2023-06-24 MED ORDER — IOHEXOL 300 MG/ML  SOLN
INTRAMUSCULAR | Status: DC | PRN
  Administered 2023-06-24: 55 mL

## 2023-06-24 MED ORDER — FENTANYL CITRATE (PF) 100 MCG/2ML IJ SOLN
INTRAMUSCULAR | Status: AC
  Filled 2023-06-24: qty 2

## 2023-06-24 MED ORDER — LIDOCAINE HCL 1 % IJ SOLN
INTRAMUSCULAR | Status: AC
  Filled 2023-06-24: qty 20

## 2023-06-24 MED ORDER — SODIUM CHLORIDE 0.9% FLUSH
3.0000 mL | Freq: Two times a day (BID) | INTRAVENOUS | Status: DC
  Administered 2023-06-25 – 2023-06-27 (×5): 3 mL via INTRAVENOUS

## 2023-06-24 MED ORDER — FENTANYL CITRATE (PF) 100 MCG/2ML IJ SOLN
INTRAMUSCULAR | Status: DC | PRN
  Administered 2023-06-24: 12.5 ug via INTRAVENOUS

## 2023-06-24 MED ORDER — HEPARIN (PORCINE) IN NACL 1000-0.9 UT/500ML-% IV SOLN
INTRAVENOUS | Status: DC | PRN
  Administered 2023-06-24: 1000 mL

## 2023-06-24 MED ORDER — HEPARIN (PORCINE) IN NACL 1000-0.9 UT/500ML-% IV SOLN
INTRAVENOUS | Status: AC
  Filled 2023-06-24: qty 1000

## 2023-06-24 MED ORDER — MIDAZOLAM HCL 2 MG/2ML IJ SOLN
INTRAMUSCULAR | Status: AC
  Filled 2023-06-24: qty 2

## 2023-06-24 MED ORDER — SODIUM CHLORIDE 0.9% FLUSH
3.0000 mL | INTRAVENOUS | Status: DC | PRN
Start: 2023-06-24 — End: 2023-06-24

## 2023-06-24 MED ORDER — VERAPAMIL HCL 2.5 MG/ML IV SOLN
INTRAVENOUS | Status: AC
Start: 2023-06-24 — End: ?
  Filled 2023-06-24: qty 2

## 2023-06-24 MED ORDER — SODIUM CHLORIDE 0.9 % IV SOLN: 250.0000 mL | INTRAVENOUS | Status: DC | PRN

## 2023-06-24 MED ORDER — ORAL CARE MOUTH RINSE: 15.0000 mL | OROMUCOSAL | Status: DC | PRN

## 2023-06-24 MED ORDER — SODIUM CHLORIDE 0.9 % IV SOLN: 250.0000 mL | INTRAVENOUS | Status: AC | PRN

## 2023-06-24 MED ORDER — VERAPAMIL HCL 2.5 MG/ML IV SOLN
INTRAVENOUS | Status: DC | PRN
  Administered 2023-06-24: 2.5 mg via INTRA_ARTERIAL

## 2023-06-24 MED ORDER — ACETAMINOPHEN 325 MG PO TABS: 650.0000 mg | ORAL_TABLET | ORAL | Status: DC | PRN

## 2023-06-24 MED ORDER — MORPHINE SULFATE (PF) 2 MG/ML IV SOLN
2.0000 mg | INTRAVENOUS | Status: DC | PRN
  Administered 2023-06-24: 2 mg via INTRAVENOUS
  Filled 2023-06-24: qty 1

## 2023-06-24 MED ORDER — HYDRALAZINE HCL 20 MG/ML IJ SOLN
10.0000 mg | INTRAMUSCULAR | Status: AC | PRN
Start: 2023-06-24 — End: 2023-06-24

## 2023-06-24 MED ORDER — SODIUM CHLORIDE 0.9 % WEIGHT BASED INFUSION
1.0000 mL/kg/h | INTRAVENOUS | Status: AC
  Administered 2023-06-24: 1 mL/kg/h via INTRAVENOUS

## 2023-06-24 MED ORDER — ENZALUTAMIDE 40 MG PO TABS
160.0000 mg | ORAL_TABLET | Freq: Every day | ORAL | Status: DC
  Administered 2023-06-25 – 2023-06-26 (×2): 160 mg via ORAL
  Filled 2023-06-24 (×3): qty 4

## 2023-06-24 SURGICAL SUPPLY — 18 items
CATH 5FR JL3.5 JR4 ANG PIG MP (CATHETERS) IMPLANT
CATH INFINITI 5FR JL4 (CATHETERS) IMPLANT
DEVICE CLOSURE MYNXGRIP 6/7F (Vascular Products) IMPLANT
DEVICE RAD TR BAND REGULAR (VASCULAR PRODUCTS) IMPLANT
DRAPE BRACHIAL (DRAPES) IMPLANT
GLIDESHEATH SLEND SS 6F .021 (SHEATH) IMPLANT
GUIDEWIRE INQWIRE 1.5J.035X260 (WIRE) IMPLANT
NDL PERC 18GX7CM (NEEDLE) IMPLANT
NEEDLE PERC 18GX7CM (NEEDLE) ×1 IMPLANT
PACK CARDIAC CATH (CUSTOM PROCEDURE TRAY) ×1 IMPLANT
SET ATX-X65L (MISCELLANEOUS) IMPLANT
SHEATH 6FR 85 DEST SLENDER (SHEATH) IMPLANT
SHEATH AVANTI 5FR X 11CM (SHEATH) IMPLANT
SHEATH AVANTI 6FR X 11CM (SHEATH) IMPLANT
SHEATH BRITE TIP 6FR X 23 (SHEATH) IMPLANT
STATION PROTECTION PRESSURIZED (MISCELLANEOUS) IMPLANT
SYSTEM COMPRESSION FEMOSTOP (HEMOSTASIS) IMPLANT
WIRE GUIDERIGHT .035X150 (WIRE) IMPLANT

## 2023-06-24 NOTE — Progress Notes (Signed)
*  PRELIMINARY RESULTS* Echocardiogram 2D Echocardiogram has been performed.  [name] 06/24/2023, 10:02 AM

## 2023-06-24 NOTE — Progress Notes (Signed)
 Patient having 10 out of 10 right leg pain. [facility] will come in and evaluate patient.

## 2023-06-24 NOTE — Hospital Course (Signed)
 [name] is a 89 y.o. male with medical history significant of permanent atrial fibrillation on edoxaban , sick sinus syndrome s/p PPM, HFpEF with last EF of 55-60%, CKD stage IIIb, hypertension, hyperlipidemia, type 2 diabetes, stage IVb metastatic prostate cancer on Xtandi  and leuprolide , DVT who presents to the ED due to altered mental status.  Upon arriving the hospital, patient was hypotensive of 70/60 with heart rate 180.  EKG showed monomorphic ventricular tachycardia which was converted emergently in the emergency room.  Peak troponin elevated to 1100. Patient was placed on heparin  drip, aspirin , Lipitor.  Seen by cardiology, scheduled for heart cath 6/4.

## 2023-06-24 NOTE — Plan of Care (Signed)
 Patient wife and step daughter updated after procedure. Please review assessment and notes. Continue to assess.

## 2023-06-24 NOTE — Progress Notes (Signed)
 Notified cardiology PA [facility] troponin 1158. Continue to assess.

## 2023-06-24 NOTE — Progress Notes (Signed)
 Patients wife brought in patients pill tray. Per pharmacy walked down with pill tray with patient label- bag and record sheet.

## 2023-06-24 NOTE — Consult Note (Addendum)
 [facility] CLINIC CARDIOLOGY CONSULT NOTE       Patient ID: [name] MRN: [id number] DOB/AGE: April 27, 1933 89 y.o.  Admit date: 06/23/2023 Referring Physician Dr. Avi Body Primary Physician Laurence Pons, NP Primary Cardiologist Dr. Beau Bound Reason for Consultation monomorphic VT, elevated trops  HPI: [name] is a 89 y.o. male  with a past medical history of permanent atrial fibrillation (on edoxaban ), chronic HFpEF, hypertension, hyperlipidemia, OSA, CKD stage 3b, infrarenal AAA, COPD, SSS s/p micra, T2DM, stage Ivb metastatic prostate cancer who presented to the ED on 06/23/2023 for altered mental status. On arrival to ED patient was hypotensive with HR in 180s and EKG revealed monomorphic ventricular tachycardia requiring cardioversion. Troponins elevated. Cardiology was consulted for further evaluation.   Patient presented to the ED with AMS and found to be hypotension with HR 180s. EKG revealed monomorphic tachycardia requiring cardioversion. EKG s/p cardioversion at 2211 with ventricular paced rhythm, rate 80s. Work up in the ED notable for NA 140, K 3.7, Cr 2.0, Mg 2.0, Hgb 14, plts 166. CXR with cardiomegaly and mild vascular congestion. BNP 490. Trops elevated and trending 16 > 170 > 920 > 1140. Patient cardioverted, started on heparin  gtt and amio gtt.    At the time of my evaluation this AM, patient was resting comfortably in hospital bed. We discussed patients symptoms in further detail. Patient states he felt off at work yesterday. Patients wife states he was acting not himself and called 911. Patient denies chest pain, palpitations, or SOB. LHC planned for this afternoon and patient is agreeable.  Stress Test: 06/2022  Probably normal pharmacologic myocardial perfusion stress test without evidence of significant ischemia or scar.   Left ventricular systolic function is normal (LVEF 55%).   Leadless pacemaker noted at the RV apex.  Coronary artery  calcifications are noted on the attenuation correction CT.   Elevation of the right hemidiaphragm again noted, as well as small calcifications in the visualized left kidney compatible with previously described renal calculi.   This is a low risk study.  Sensitivity and specificity are degraded by significant extracardiac activity and diaphragmatic attenuation.  Review of systems complete and found to be negative unless listed above    Past Medical History:  Diagnosis Date   Acute deep vein thrombosis (DVT) of femoral vein of left lower extremity (HCC)    Atrial fibrillation (HCC)    CHF (congestive heart failure) (HCC)    Chronic kidney disease    Diabetes (HCC)    Hearing loss    High blood pressure    History of bladder problems    Hyperlipidemia    Kidney stones 11/22/2014   Liver cyst    OSA (obstructive sleep apnea)    Prostate cancer (HCC)    Prostate cancer (HCC)    Stroke (HCC)    Valvular heart disease     Past Surgical History:  Procedure Laterality Date   CATARACT EXTRACTION     COLONOSCOPY WITH PROPOFOL  N/A 07/02/2019   Procedure: COLONOSCOPY WITH PROPOFOL ;  Surgeon: Selena Daily, MD;  Location: [facility] ENDOSCOPY;  Service: Gastroenterology;  Laterality: N/A;   ESOPHAGOGASTRODUODENOSCOPY (EGD) WITH PROPOFOL  N/A 03/19/2022   Procedure: ESOPHAGOGASTRODUODENOSCOPY (EGD) WITH PROPOFOL ;  Surgeon: Luke Salaam, MD;  Location: [facility] ENDOSCOPY;  Service: Gastroenterology;  Laterality: N/A;   IR CT HEAD LTD  08/27/2019   IR PERCUTANEOUS ART THROMBECTOMY/INFUSION INTRACRANIAL INC DIAG ANGIO  08/27/2019       IR PERCUTANEOUS ART THROMBECTOMY/INFUSION INTRACRANIAL INC DIAG ANGIO  08/27/2019   LOWER EXTREMITY ANGIOGRAPHY Left 12/25/2020   Procedure: Lower Extremity Angiography;  Surgeon: Celso College, MD;  Location: [facility] INVASIVE CV LAB;  Service: Cardiovascular;  Laterality: Left;   PACEMAKER LEADLESS INSERTION N/A 12/19/2020   Procedure: PACEMAKER LEADLESS INSERTION;  Surgeon:  Percival Brace, MD;  Location: [facility] INVASIVE CV LAB;  Service: Cardiovascular;  Laterality: N/A;   PROSTATE CANCER     PROSTATE SURGERY     RADIOLOGY WITH ANESTHESIA N/A 08/27/2019   Procedure: IR WITH ANESTHESIA;  Surgeon: Luellen Sages, MD;  Location: [facility] OR;  Service: Radiology;  Laterality: N/A;    Medications Prior to Admission  Medication Sig Dispense Refill Last Dose/Taking   albuterol  (VENTOLIN  HFA) 108 (90 Base) MCG/ACT inhaler Inhale 2 puffs into the lungs every 4 (four) hours as needed for wheezing or shortness of breath. 18 g 5 Taking As Needed   atorvastatin  (LIPITOR) 10 MG tablet TAKE 1 TABLET(10 MG) BY MOUTH DAILY 90 tablet 1 Taking   dapagliflozin  propanediol (FARXIGA ) 10 MG TABS tablet Take 1 tablet (10 mg total) by mouth daily. 30 tablet 5 Taking   edoxaban  (SAVAYSA ) 30 MG TABS tablet Take 30 mg by mouth daily.   Taking   enzalutamide  (XTANDI ) 40 MG tablet Take 4 tablets (160 mg total) by mouth daily. 120 tablet 2 Taking   furosemide  (LASIX ) 20 MG tablet Take 1 tablet (20 mg total) by mouth daily. 90 tablet 3 Taking   Metoprolol  Tartrate 37.5 MG TABS Take 1 tablet (37.5 mg total) by mouth 2 (two) times daily. 60 tablet 11 Taking   pantoprazole  (PROTONIX ) 40 MG tablet Take 1 tablet (40 mg total) by mouth daily. 90 tablet 3 Taking   Tiotropium Bromide  Monohydrate (SPIRIVA  RESPIMAT) 2.5 MCG/ACT AERS Inhale 2 puffs into the lungs daily. 4 g 3 Taking   Cholecalciferol (VITAMIN D-3 PO) Take 1 capsule by mouth daily.   Unknown   ferrous sulfate  (FEROSUL) 325 (65 FE) MG tablet Take 1 tablet by mouth daily.   Unknown   glucose blood (ONETOUCH VERIO) test strip USE ONE STRIP DAILY 100 strip 3    Semaglutide ,0.25 or 0.5MG /DOS, (OZEMPIC , 0.25 OR 0.5 MG/DOSE,) 2 MG/3ML SOPN Inject 0.5 mg into the skin once a week.   Unknown   vitamin B-12 (CYANOCOBALAMIN ) 1000 MCG tablet Take 1 tablet (1,000 mcg total) by mouth daily. 30 tablet 0 Unknown   Social History   Socioeconomic History    Marital status: Married    Spouse name: Alf Doyle   Number of children: 2   Years of education: 12   Highest education level: 12th grade  Occupational History   Occupation: [employer]  Tobacco Use   Smoking status: Former    Types: Cigarettes   Smokeless tobacco: Never  Vaping Use   Vaping status: Never Used  Substance and Sexual Activity   Alcohol use: No   Drug use: No   Sexual activity: Not Currently  Other Topics Concern   Not on file  Social History Narrative   Not on file   Social Drivers of Health   Financial Resource Strain: Low Risk  (10/29/2019)   Overall Financial Resource Strain (CARDIA)    Difficulty of Paying Living Expenses: Not very hard  Recent Concern: Financial Resource Strain - Medium Risk (09/08/2019)   Overall Financial Resource Strain (CARDIA)    Difficulty of Paying Living Expenses: Somewhat hard  Food Insecurity: No Food Insecurity (06/23/2023)   Hunger Vital Sign    Worried About Running Out  of Food in the Last Year: Never true    Ran Out of Food in the Last Year: Never true  Transportation Needs: No Transportation Needs (06/23/2023)   PRAPARE - Administrator, Civil Service (Medical): No    Lack of Transportation (Non-Medical): No  Physical Activity: Inactive (10/29/2019)   Exercise Vital Sign    Days of Exercise per Week: 0 days    Minutes of Exercise per Session: 0 min  Stress: No Stress Concern Present (10/29/2019)   [employer] of Occupational Health - Occupational Stress Questionnaire    Feeling of Stress : Only a little  Recent Concern: Stress - Stress Concern Present (09/08/2019)   [employer] of Occupational Health - Occupational Stress Questionnaire    Feeling of Stress : To some extent  Social Connections: Moderately Integrated (06/23/2023)   Social Connection and Isolation Panel [NHANES]    Frequency of Communication with Friends and Family: More than three times a week    Frequency of Social Gatherings with  Friends and Family: More than three times a week    Attends Religious Services: More than 4 times per year    Active Member of [employer] or Organizations: No    Attends Banker Meetings: Never    Marital Status: Married  Catering manager Violence: Not At Risk (06/23/2023)   Humiliation, Afraid, Rape, and Kick questionnaire    Fear of Current or Ex-Partner: No    Emotionally Abused: No    Physically Abused: No    Sexually Abused: No    Family History  Problem Relation Age of Onset   Colon cancer Mother    Diabetes Other    High blood pressure Other    Prostate cancer Brother    Diabetes Brother      Vitals:   06/24/23 0800 06/24/23 0928 06/24/23 1000 06/24/23 1100  BP: (!) 143/86 (!) 145/98 (!) 144/91 (!) 135/101  Pulse: 70 73 71 74  Resp: 17 20 (!) 23 15  Temp:      TempSrc:      SpO2: 93% 97% 94% 95%  Weight:      Height:        PHYSICAL EXAM General: well appearing elderly male, well nourished, in no acute distress. HEENT: Normocephalic and atraumatic. Neck: No JVD.   Lungs: Normal respiratory effort on room air. Clear bilaterally to auscultation. No wheezes, crackles, rhonchi.  Heart: HRRR (paced). Normal S1 and S2 without gallops or murmurs.  Abdomen: Non-distended appearing.  Msk: Normal strength and tone for age. Extremities: Warm and well perfused. No clubbing, cyanosis. No edema.  Neuro: Alert and oriented X 3. Psych: Answers questions appropriately.   Labs: Basic Metabolic Panel: Recent Labs    06/23/23 1857 06/24/23 0329  NA 140 142  K 3.7 3.7  CL 110 106  CO2 19* 29  GLUCOSE 175* 112*  BUN 28* 28*  CREATININE 2.00* 1.83*  CALCIUM  8.5* 9.1  MG 2.0 2.1   Liver Function Tests: Recent Labs    06/23/23 1857 06/24/23 0329  AST 19 26  ALT 11 16  ALKPHOS 107 106  BILITOT 0.6 0.5  PROT 6.4* 6.7  ALBUMIN  3.0* 3.2*   No results for input(s): "LIPASE", "AMYLASE" in the last 72 hours. CBC: Recent Labs    06/23/23 1857 06/24/23 0329   WBC 5.4 4.9  NEUTROABS 2.4  --   HGB 14.0 14.7  HCT 43.2 45.7  MCV 96.0 96.0  PLT 166 154   Cardiac  Enzymes: Recent Labs    06/24/23 0329 06/24/23 0827 06/24/23 1012  TROPONINIHS 1,144* 1,158* 927*   BNP: Recent Labs    06/23/23 1857  BNP 495.0*   D-Dimer: No results for input(s): "DDIMER" in the last 72 hours. Hemoglobin A1C: No results for input(s): "HGBA1C" in the last 72 hours. Fasting Lipid Panel: No results for input(s): "CHOL", "HDL", "LDLCALC", "TRIG", "CHOLHDL", "LDLDIRECT" in the last 72 hours. Thyroid  Function Tests: No results for input(s): "TSH", "T4TOTAL", "T3FREE", "THYROIDAB" in the last 72 hours.  Invalid input(s): "FREET3" Anemia Panel: No results for input(s): "VITAMINB12", "FOLATE", "FERRITIN", "TIBC", "IRON", "RETICCTPCT" in the last 72 hours.   Radiology: ECHOCARDIOGRAM COMPLETE Result Date: 06/24/2023    ECHOCARDIOGRAM REPORT   Patient Name:   RODRICUS CANDELARIA Date of Exam: 06/24/2023 Medical Rec #:  [id number]         Height:       72.0 in Accession #:    [id number]        Weight:       234.6 lb Date of Birth:  [birth year]          BSA:          2.280 m Patient Age:    89 years          BP:           136/117 mmHg Patient Gender: M                 HR:           70 bpm. Exam Location:  [facility] Procedure: 2D Echo, Cardiac Doppler and Color Doppler (Both Spectral and Color            Flow Doppler were utilized during procedure). Indications:     Ventricular tachycardia  History:         Patient has prior history of Echocardiogram examinations, most                  recent 07/09/2022. CHF, Stroke, Arrythmias:Atrial Fibrillation;                  Risk Factors:Sleep Apnea.  Sonographer:     Broadus Canes Referring Phys:  [id number] Avi Body Diagnosing Phys: Lida Reeks Alluri IMPRESSIONS  1. Technically difficult study.  2. Left ventricular ejection fraction, by estimation, is 45 to 50%. The left ventricle has mildly decreased function. The left ventricle demonstrates  regional wall motion abnormalities (see scoring diagram/findings for description). There is moderate left ventricular hypertrophy. Left ventricular diastolic parameters are indeterminate.  3. Right ventricular systolic function was not well visualized. The right ventricular size is not well visualized.  4. Left atrial size was moderately dilated.  5. The mitral valve is normal in structure. Trivial mitral valve regurgitation.  6. The aortic valve is tricuspid. Aortic valve regurgitation is not visualized. FINDINGS  Left Ventricle: Left ventricular ejection fraction, by estimation, is 45 to 50%. The left ventricle has mildly decreased function. The left ventricle demonstrates regional wall motion abnormalities. The left ventricular internal cavity size was normal in size. There is moderate left ventricular hypertrophy. Left ventricular diastolic parameters are indeterminate.  LV Wall Scoring: The antero-lateral wall, posterior wall, and basal inferior segment are hypokinetic. The entire anterior wall, entire septum, entire apex, and mid and distal inferior wall are normal. Right Ventricle: The right ventricular size is not well visualized. Right vetricular wall thickness was not well visualized. Right ventricular systolic function was not well visualized. Left Atrium: Left  atrial size was moderately dilated. Right Atrium: Right atrial size was not well visualized. Pericardium: There is no evidence of pericardial effusion. Mitral Valve: The mitral valve is normal in structure. Trivial mitral valve regurgitation. Tricuspid Valve: The tricuspid valve is not well visualized. Tricuspid valve regurgitation is not demonstrated. Aortic Valve: The aortic valve is tricuspid. Aortic valve regurgitation is not visualized. Aortic valve mean gradient measures 1.5 mmHg. Aortic valve peak gradient measures 2.2 mmHg. Aortic valve area, by VTI measures 2.85 cm. Pulmonic Valve: The pulmonic valve was not well visualized. Pulmonic  valve regurgitation is not visualized. Aorta: The aortic root is normal in size and structure. IAS/Shunts: The interatrial septum was not well visualized.  LEFT VENTRICLE PLAX 2D LVIDd:         4.20 cm     Diastology LVIDs:         3.60 cm     LV e' medial:    6.04 cm/s LV PW:         1.30 cm     LV E/e' medial:  13.8 LV IVS:        1.40 cm     LV e' lateral:   9.25 cm/s LVOT diam:     2.20 cm     LV E/e' lateral: 9.0 LV SV:         34 LV SV Index:   15 LVOT Area:     3.80 cm  LV Volumes (MOD) LV vol d, MOD A2C: 73.2 ml LV vol d, MOD A4C: 78.5 ml LV vol s, MOD A2C: 36.4 ml LV vol s, MOD A4C: 38.0 ml LV SV MOD A2C:     36.8 ml LV SV MOD A4C:     78.5 ml LV SV MOD BP:      38.5 ml RIGHT VENTRICLE RV Basal diam:  3.90 cm RV Mid diam:    3.50 cm LEFT ATRIUM              Index        RIGHT ATRIUM           Index LA diam:        6.40 cm  2.81 cm/m   RA Area:     28.40 cm LA Vol (A2C):   117.0 ml 51.32 ml/m  RA Volume:   85.80 ml  37.63 ml/m LA Vol (A4C):   69.5 ml  30.48 ml/m LA Biplane Vol: 94.8 ml  41.58 ml/m  AORTIC VALVE AV Area (Vmax):    2.24 cm AV Area (Vmean):   2.20 cm AV Area (VTI):     2.85 cm AV Vmax:           74.60 cm/s AV Vmean:          48.650 cm/s AV VTI:            0.118 m AV Peak Grad:      2.2 mmHg AV Mean Grad:      1.5 mmHg LVOT Vmax:         43.90 cm/s LVOT Vmean:        28.200 cm/s LVOT VTI:          0.089 m LVOT/AV VTI ratio: 0.75  AORTA Ao Root diam: 3.70 cm MITRAL VALVE               TRICUSPID VALVE MV Area (PHT): 5.34 cm    TR Peak grad:   9.5 mmHg MV Decel Time: 142 msec  TR Vmax:        154.00 cm/s MV E velocity: 83.30 cm/s                            SHUNTS                            Systemic VTI:  0.09 m                            Systemic Diam: 2.20 cm Joetta Mustache Electronically signed by Joetta Mustache Signature Date/Time: 06/24/2023/11:49:00 AM    Final    DG Chest Portable 1 View Result Date: 06/23/2023 CLINICAL DATA:  vtach, cardioverted EXAM: PORTABLE CHEST 1 VIEW  COMPARISON:  Chest radiograph 09/21/2022 FINDINGS: Lung volumes remain low. Cardiomegaly is stable. Mediastinal contours are unchanged. Mild vascular congestion. No focal airspace disease. No pneumothorax or large pleural effusion. On limited assessment, no acute osseous findings. IMPRESSION: Low lung volumes with cardiomegaly and mild vascular congestion. Electronically Signed   By: Chadwick Colonel M.D.   On: 06/23/2023 20:04    ECHO as above  TELEMETRY reviewed by me 06/24/2023: V-paced, rate 70s. (Had 10 beat NSVT on 06/03 at 0725).   EKG reviewed by me: monomorphic wide complex ventricular tachycardia, rate 181 bpm.  Data reviewed by me 06/24/2023: last 24h vitals tele labs imaging I/O ED provider note, admission H&P.  Principal Problem:   Monomorphic ventricular tachycardia (HCC) Active Problems:   CKD (chronic kidney disease), stage IV (HCC)   Diabetes mellitus (HCC)   OSA (obstructive sleep apnea)   Acute on chronic heart failure with preserved ejection fraction (HFpEF) (HCC)   NSTEMI (non-ST elevated myocardial infarction) (HCC)   COPD (chronic obstructive pulmonary disease) (HCC)   Chronic a-fib (HCC)   Prostate cancer (HCC)   Hypothermia    ASSESSMENT AND PLAN:  [name] is a 89 y.o. male  with a past medical history of permanent atrial fibrillation (on edoxaban ), chronic HFpEF, hypertension, hyperlipidemia, OSA, CKD stage 3b, infrarenal AAA, COPD, SSS s/p micra, T2DM, stage Ivb metastatic prostate cancer who presented to the ED on 06/23/2023 for altered mental status. On arrival to ED patient was hypotensive with HR in 180s and EKG revealed monomorphic ventricular tachycardia requiring cardioversion. Troponins elevated. Cardiology was consulted for further evaluation.   # Monomorphic Ventricular Tachycardia s/p cardioversion  # NSTEMI # Acute on chronic HFpEF # Permanent Atrial fibrillation # Hypertension # Hyperlipidemia Patient reports with AMS and found to be in  monomorphic VT with HR in 180s in ED. Patient underwent cardioversion. EKG in ED s/p cardioversion at 2211 with ventricular paced rhythm, rate 80s. BNP 490. Trops elevated and trending 16 > 170 > 920 > 1140>1150>920 . Echo this admission with newly mildly reduced EF (45-50%) with anterolateral, posterior wall  and basal inferior segment are hypokinetic, mod LVH (new abnormalities compared to prior echo). Echo from 06/2023 with pEF (55-60%) and no RWMA.  -Monitor and replenish electrolytes for a goal K >4, Mag >2  -Continue to trend troponins until peaked.  -Continue heparin  gtt. (Patient takes edoxaban  30 mg daily at home for atrial fibrillation).  -Continue amio gtt. -Continue ASA 81 mg, atorvastatin  10 mg daily -Consider resuming home GDMT (dapagliflozin , metoprolol  tartrate) prior to discharge if BP remains stable and renal function allows.  -Plan for [facility] today with Dr. Parks Bollman. Keep  patient NPO. -Discussed the risks and benefits of proceeding with LHC for further evaluation with the patient. He is agreeable to proceed.  NPO until LHC this afternoon (06/03 at 12:30 PM) with Dr. Parks Bollman.  Written consent obtained.  Further recommendations following LHC.   -Updated/spoke to Mrs. Faucette (patient's stepdaughter).  -EP consulted. Appreciate recommendations.   ADDENDUM: LHC with no significant coronary artery disease, no intervention needed. Discontinue heparin . Right groin incision site formed hematoma s/p LHC. Will monitor right groin site and consider restarting home AC (edoxaban ) tomorrow if right groin site is stable.   This patient's plan of care was discussed and created with Dr. Bob Burn and he is in agreement.  Signed: Creighton Doffing, PA-C  06/24/2023, 12:06 PM [facility] Cardiology

## 2023-06-24 NOTE — Progress Notes (Signed)
 PHARMACY - ANTICOAGULATION CONSULT NOTE  Pharmacy Consult for Heparin   Indication: chest pain/ACS and atrial fibrillation  Patient Measurements: Height: 6' (182.9 cm) Weight: 106.4 kg (234 lb 9.1 oz) IBW/kg (Calculated) : 77.6 HEPARIN  DW (KG): 100.2  Labs: Recent Labs    06/23/23 1857 06/23/23 2044 06/23/23 2218 06/24/23 0122 06/24/23 0329 06/24/23 0700  HGB 14.0  --   --   --  14.7  --   HCT 43.2  --   --   --  45.7  --   PLT 166  --   --   --  154  --   APTT  --   --  27  --   --  106*  HEPARINUNFRC  --   --  0.88*  --   --  1.09*  CREATININE 2.00*  --   --   --  1.83*  --   TROPONINIHS 16 177*  --  924* 1,144*  --     Estimated Creatinine Clearance: 35.2 mL/min (A) (by C-G formula based on SCr of 1.83 mg/dL (H)).   Medical History: Past Medical History:  Diagnosis Date   Acute deep vein thrombosis (DVT) of femoral vein of left lower extremity (HCC)    Atrial fibrillation (HCC)    CHF (congestive heart failure) (HCC)    Chronic kidney disease    Diabetes (HCC)    Hearing loss    High blood pressure    History of bladder problems    Hyperlipidemia    Kidney stones 11/22/2014   Liver cyst    OSA (obstructive sleep apnea)    Prostate cancer (HCC)    Prostate cancer (HCC)    Stroke [facility])    Valvular heart disease    Assessment: Pharmacy consulted to dose heparin  in this 89 year old male admitted with Afib, NSTEMI.   Pt was on Savaysa  (edoxaban ) 30 mg PO daily , last dose on 6/2 AM.  Goal of Therapy:  Heparin  level 0.3-0.7 units/ml aPTT 66 - 102  seconds Monitor platelets by anticoagulation protocol: Yes   Plan:  --aPTT slightly supratherapeutic. Heparin  level elevated at baseline and not yet correlating --Decrease heparin  infusion to 1300 units/hr --Re-check aPTT 8 hours from rate change --Daily CBC per protocol while on IV heparin   Calah Gershman B Ival Basquez 06/24/2023,7:50 AM

## 2023-06-24 NOTE — Progress Notes (Signed)
 Patient came back from unit from procedure. Patient has a 9x5 soft palpable hematoma, no bleeding. Patient does state he has some back pain and right leg pain. PA and MD into see patient and aware. Orders for morphine . Continue to assess.

## 2023-06-24 NOTE — Progress Notes (Signed)
*  PRELIMINARY RESULTS* Echocardiogram 2D Echocardiogram has been performed.  [name] 06/24/2023, 10:01 AM

## 2023-06-24 NOTE — Progress Notes (Signed)
 Patient ordered on CPAP at night. Patient stated "doesn't wear one at home, so doesn't need one here"

## 2023-06-24 NOTE — Consult Note (Addendum)
 ELECTROPHYSIOLOGY CONSULT NOTE    Patient ID: [name] MRN: [id number], DOB/AGE: [birth year] 89 y.o.  Admit date: 06/23/2023 Date of Consult: 06/24/2023  Primary Physician: Laurence Pons, NP Primary Cardiologist: Constancia Delton, MD  Electrophysiologist: Dr. Parks Bollman  Referring Provider: Dr. Bob Burn  Patient Profile: [name] is a 89 y.o. male with a history of perm AFib, SND s/p leadless PPM, HFpEF, CKD 3b, HTN, T2DM, metastatic prostate cancer, HLD, COPD, OSA who is being seen today for the evaluation of MM VT at the request of Dr. Jeane Miguel.  HPI:  [name] is a 89 y.o. male with PMH as above.   He was in his usual state of health yesterday at work, but by the time he arrived home he did not feel well. He is not able to quantify how he felt unwell, denies frank chest pain, chest pressure, SOB, or presyncope - just did not feel well. He says his wife also noticed he was not acting like himself and called 911.   Initial EKG in ER with MM VT at 180, hypotensive to 74/60. He was cardioverted in ER with return to paced ventricular rhythm.  Labs notable for Cr at 2 (around baseline), K 3.7, BNP 495 Initial trop 16 prior to DCCV. Post CV, trops have continued to rise.   Currently, he feels well, denies palpitations, chest pain, chest pressure, SOB. 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 He lives alone with wife, who is forgetful. Family check on them frequently.   He is hungry and requests to eat and drink when possible.   Labs Potassium3.7 (06/03 0329) Magnesium   2.1 (06/03 0329) Creatinine, ser  1.83* (06/03 0329) PLT  154 (06/03 0329) HGB  14.7 (06/03 0329) WBC 4.9 (06/03 0329) Troponin I (High Sensitivity)1,144* (06/03 0329).    Past Medical History:  Diagnosis Date   Acute deep vein thrombosis (DVT) of femoral vein of left lower extremity (HCC)    Atrial fibrillation (HCC)     CHF (congestive heart failure) (HCC)    Chronic kidney disease    Diabetes (HCC)    Hearing loss    High blood pressure    History of bladder problems    Hyperlipidemia    Kidney stones 11/22/2014   Liver cyst    OSA (obstructive sleep apnea)    Prostate cancer (HCC)    Prostate cancer (HCC)    Stroke (HCC)    Valvular heart disease      Surgical History:  Past Surgical History:  Procedure Laterality Date   CATARACT EXTRACTION     COLONOSCOPY WITH PROPOFOL  N/A 07/02/2019   Procedure: COLONOSCOPY WITH PROPOFOL ;  Surgeon: Selena Daily, MD;  Location: [facility] ENDOSCOPY;  Service: Gastroenterology;  Laterality: N/A;   ESOPHAGOGASTRODUODENOSCOPY (EGD) WITH PROPOFOL  N/A 03/19/2022   Procedure: ESOPHAGOGASTRODUODENOSCOPY (EGD) WITH PROPOFOL ;  Surgeon: Luke Salaam, MD;  Location: [facility] ENDOSCOPY;  Service: Gastroenterology;  Laterality: N/A;   IR CT HEAD LTD  08/27/2019   IR PERCUTANEOUS ART THROMBECTOMY/INFUSION INTRACRANIAL INC DIAG ANGIO  08/27/2019       IR PERCUTANEOUS ART THROMBECTOMY/INFUSION INTRACRANIAL INC DIAG ANGIO  08/27/2019   LOWER EXTREMITY ANGIOGRAPHY Left 12/25/2020   Procedure: Lower Extremity Angiography;  Surgeon: Celso College, MD;  Location: [facility] INVASIVE CV LAB;  Service: Cardiovascular;  Laterality: Left;   PACEMAKER LEADLESS INSERTION N/A 12/19/2020   Procedure: PACEMAKER LEADLESS INSERTION;  Surgeon: Percival Brace, MD;  Location:  [facility] INVASIVE CV LAB;  Service: Cardiovascular;  Laterality: N/A;   PROSTATE CANCER     PROSTATE SURGERY     RADIOLOGY WITH ANESTHESIA N/A 08/27/2019   Procedure: IR WITH ANESTHESIA;  Surgeon: Luellen Sages, MD;  Location: [facility] OR;  Service: Radiology;  Laterality: N/A;     Medications Prior to Admission  Medication Sig Dispense Refill Last Dose/Taking   albuterol  (VENTOLIN  HFA) 108 (90 Base) MCG/ACT inhaler Inhale 2 puffs into the lungs every 4 (four) hours as needed for wheezing or shortness of breath. 18 g 5 Taking As Needed    atorvastatin  (LIPITOR) 10 MG tablet TAKE 1 TABLET(10 MG) BY MOUTH DAILY 90 tablet 1 Taking   dapagliflozin  propanediol (FARXIGA ) 10 MG TABS tablet Take 1 tablet (10 mg total) by mouth daily. 30 tablet 5 Taking   edoxaban  (SAVAYSA ) 30 MG TABS tablet Take 30 mg by mouth daily.   Taking   enzalutamide  (XTANDI ) 40 MG tablet Take 4 tablets (160 mg total) by mouth daily. 120 tablet 2 Taking   furosemide  (LASIX ) 20 MG tablet Take 1 tablet (20 mg total) by mouth daily. 90 tablet 3 Taking   Metoprolol  Tartrate 37.5 MG TABS Take 1 tablet (37.5 mg total) by mouth 2 (two) times daily. 60 tablet 11 Taking   pantoprazole  (PROTONIX ) 40 MG tablet Take 1 tablet (40 mg total) by mouth daily. 90 tablet 3 Taking   Tiotropium Bromide  Monohydrate (SPIRIVA  RESPIMAT) 2.5 MCG/ACT AERS Inhale 2 puffs into the lungs daily. 4 g 3 Taking   Cholecalciferol (VITAMIN D-3 PO) Take 1 capsule by mouth daily.   Unknown   ferrous sulfate  (FEROSUL) 325 (65 FE) MG tablet Take 1 tablet by mouth daily.   Unknown   glucose blood (ONETOUCH VERIO) test strip USE ONE STRIP DAILY 100 strip 3    Semaglutide ,0.25 or 0.5MG /DOS, (OZEMPIC , 0.25 OR 0.5 MG/DOSE,) 2 MG/3ML SOPN Inject 0.5 mg into the skin once a week.   Unknown   vitamin B-12 (CYANOCOBALAMIN ) 1000 MCG tablet Take 1 tablet (1,000 mcg total) by mouth daily. 30 tablet 0 Unknown    Inpatient Medications:   aspirin  EC  81 mg Oral Daily   atorvastatin   10 mg Oral Daily   enzalutamide   160 mg Oral Daily   ferrous sulfate   325 mg Oral Daily   insulin  aspart  0-15 Units Subcutaneous TID WC   pantoprazole   40 mg Oral Daily   sodium chloride  flush  3 mL Intravenous Q12H   umeclidinium bromide  1 puff Inhalation Daily    Allergies: No Known Allergies  Family History  Problem Relation Age of Onset   Colon cancer Mother    Diabetes Other    High blood pressure Other    Prostate cancer Brother    Diabetes Brother      Physical Exam: Vitals:   06/24/23 0400 06/24/23 0500 06/24/23  0600 06/24/23 0700  BP: (!) 139/97 (!) 142/100 (!) 140/90 (!) 136/117  Pulse: 69 70 70 70  Resp: 14 16 14 17   Temp: (!) 97.5 F (36.4 C)   (!) 97.3 F (36.3 C)  TempSrc: Axillary     SpO2: 95% 96% 96% 96%  Weight:  106.4 kg    Height:        GEN- NAD, A&O x 3, normal affect HEENT: Normocephalic, atraumatic Lungs- CTAB, Normal effort.  Heart- Regular rate and rhythm, No M/G/R.  GI- Soft, NT, ND.  Extremities- 1+ bilateral ruddy edema   Radiology/Studies: DG Chest Portable 1  View Result Date: 06/23/2023 CLINICAL DATA:  vtach, cardioverted EXAM: PORTABLE CHEST 1 VIEW COMPARISON:  Chest radiograph 09/21/2022 FINDINGS: Lung volumes remain low. Cardiomegaly is stable. Mediastinal contours are unchanged. Mild vascular congestion. No focal airspace disease. No pneumothorax or large pleural effusion. On limited assessment, no acute osseous findings. IMPRESSION: Low lung volumes with cardiomegaly and mild vascular congestion. Electronically Signed   By: Chadwick Colonel M.D.   On: 06/23/2023 20:04    TTE, 06/22/2022 LVEF 55-60%, no WMA, mod LVH LA severely dilated  EKG: 06/23/2023 1846 - MM VT at 181bpm. QRS 127  03/22/2023 - AF at 79bpm, RBBB. QRS   (personally reviewed)  TELEMETRY: VP with frequent PVCs, 12bt NSVT  (personally reviewed)  DEVICE HISTORY:  MDT Micra AV leadless PPM, imp 11/2020 by Dr. Parks Bollman  Assessment/Plan: #) MM VT #) perm AFib #) leadless PPM #) HTN  Patient presented to ER with MM VT with AMS. S/p DCCV in ER to sinus / paced rhythm Most recent TTE with preserved LVEF Pending updated TTE.  If LVEF reduced, consider LHC Patient was loaded on amiodarone gtt, currently at 30mg /hr - continue at this time Continue 37.5mg  lopressor  BID as BP allows Consider addition of verapamil  if NSVT recurs  Patient's home edoxaban  on hold, continue hep gtt for stroke ppx Appreciate pharm's assistance with hep dosing Keep K > 4, Mag > 2   #) stage 4 prostate  cancer Mgmt per primary team / oncology  Consider palliative care consult to ensure goals of care are addressed/followed   Dr. Daneil Dunker to see    For questions or updates, please contact [facility] Please consult [email or web address] for contact info under      Signed, Suzann Riddle, NP  06/24/2023 8:14 AM   I have seen, examined the patient, and reviewed the above assessment and plan.    HPI: Patient reported that he was in his usual state health until yesterday evening when he returned home from work.  He suddenly began feeling very unwell.  He called the EMS and was found to have elevated heart rate.  He was brought to ED where he was shown to have sustained monomorphic ventricular tachycardia.  He underwent cardioversion.  He subsequently felt much better over the course of the evening.  No acute overnight events.  Patient seen in prep area for cardiac catheterization.  Reported feeling relatively well.  No new or acute complaints.  No sustained arrhythmias on IV amiodarone.  General: Well developed, in no acute distress.  Neck: No JVD.  Cardiac: Normal rate, regular rhythm.  Resp: Normal work of breathing.  Ext: No edema.  Neuro: No gross focal deficits.  Psych: Normal affect.   EKG 06/23/23:    Assessment and Plan:  #. VT: EKG appears most consistent with ventricular tachycardia originating from the left anterior fascicle.  This is most often an idiopathic VT.  Given drop in LVEF on echo, he has been scheduled to undergo left heart catheterization to rule out an ischemic etiology. Drop in EF could be myocardial stunning from VT and cardioversion. - If left heart catheterization is normal, then could try treatment with verapamil  for fascicular VT suppression if BP allows for sufficient dosing.  Continuing amiodarone would not be unreasonable given his age and comorbidities, but would require closer monitoring for side effects/medication interactions.    #. Permanent atrial  fibrillation:  -Transition back to patient's home edoxaban  after cardiac catheterization is performed.  Ardeen Kohler, MD, [facility], [facility] Cardiac  Electrophysiology

## 2023-06-24 NOTE — Progress Notes (Signed)
 Progress Note   Patient: [name] UJW:[id number] DOB: [birth year] DOA: 06/23/2023     1 DOS: the patient was seen and examined on 06/24/2023   Brief hospital course: [name] is a 89 y.o. male with medical history significant of permanent atrial fibrillation on edoxaban , sick sinus syndrome s/p PPM, HFpEF with last EF of 55-60%, CKD stage IIIb, hypertension, hyperlipidemia, type 2 diabetes, stage IVb metastatic prostate cancer on Xtandi  and leuprolide , DVT who presents to the ED due to altered mental status.  Upon arriving the hospital, patient was hypotensive of 70/60 with heart rate 180.  EKG showed monomorphic ventricular tachycardia which was converted emergently in the emergency room.  Peak troponin elevated to 1100. Patient was placed on heparin  drip, aspirin , Lipitor.  Seen by cardiology, scheduled for heart cath 6/4.    Principal Problem:   Monomorphic ventricular tachycardia (HCC) Active Problems:   Acute on chronic heart failure with preserved ejection fraction (HFpEF) (HCC)   NSTEMI (non-ST elevated myocardial infarction) (HCC)   Hypothermia   CKD (chronic kidney disease), stage IV (HCC)   Diabetes mellitus (HCC)   OSA (obstructive sleep apnea)   COPD (chronic obstructive pulmonary disease) (HCC)   Chronic a-fib (HCC)   Prostate cancer (HCC)   Assessment and Plan:  * Monomorphic ventricular tachycardia (HCC) secondary to non-STEMI. Non-ST elevation myocardial infarction. Acute on chronic combined systolic and diastolic congestive heart failure. Acute metabolic encephalopathy secondary to ventricular tachycardia Hypotension secondary to ventricular tachycardia VT has been converted to atrial fibrillation, eventually tachycardia was thought to be due to acute myocardial infarction. Patient also had some volume overload, has received IV Lasix . Patient is also continued on amiodarone drip for ventricular tachycardia. Echocardiogram performed today showed  ejection fraction 45 to 50%, with left ventricular motion abnormality.  Left ventricular hypertrophy. Continue monitor patient in the stepdown unit.  Hypothermia Patient was noted to be hypothermic on presentation, with improvement in temperature post cardioversion.  Cortisol level elevated.  Prostate cancer [facility]) Patient has a history of stage IVb prostate cancer with regional lymphadenopathy, on Xtandi  and leuprolide .  - Continue home regimen  Chronic a-fib (HCC) Heart rate uncontrolled, patient currently on heparin  drip.  COPD (chronic obstructive pulmonary disease) (HCC) No exacerbation.  OSA (obstructive sleep apnea) - Resume home CPAP at bedtime  Diabetes mellitus (HCC) - Hold home Ozempic  and Farxiga  - SSI, moderate  CKD (chronic kidney disease), stage IV (HCC) Per chart review, patient has a history of CKD stage IIIb-4, with creatinine ranging between 1.7 and 2.3, currently 2.0.      Subjective:  Patient denies any chest pain, no significant short of breath.  Physical Exam: Vitals:   06/24/23 0800 06/24/23 0928 06/24/23 1000 06/24/23 1100  BP: (!) 143/86 (!) 145/98 (!) 144/91 (!) 135/101  Pulse: 70 73 71 74  Resp: 17 20 (!) 23 15  Temp:      TempSrc:      SpO2: 93% 97% 94% 95%  Weight:      Height:       General exam: Appears calm and comfortable  Respiratory system: Clear to auscultation. Respiratory effort normal. Cardiovascular system: S1 & S2 heard, RRR. No JVD, murmurs, rubs, gallops or clicks. No pedal edema. Gastrointestinal system: Abdomen is nondistended, soft and nontender. No organomegaly or masses felt. Normal bowel sounds heard. Central nervous system: Alert and oriented. No focal neurological deficits. Extremities: Symmetric 5 x 5 power. Skin: No rashes, lesions or ulcers Psychiatry: Judgement and insight appear normal.  Mood & affect appropriate.    Data Reviewed:  Reviewed EKGs, lab results.  Reviewed echocardiogram results.  Family  Communication: None  Disposition: Status is: Inpatient Remains inpatient appropriate because: Severity of disease, IV treatment.     Time spent: 50 minutes  Author: Donaciano Frizzle, MD 06/24/2023 11:48 AM  For on call review [email or web address].

## 2023-06-25 DIAGNOSIS — I4729 Other ventricular tachycardia: Secondary | ICD-10-CM | POA: Diagnosis not present

## 2023-06-25 DIAGNOSIS — N189 Chronic kidney disease, unspecified: Secondary | ICD-10-CM

## 2023-06-25 DIAGNOSIS — E119 Type 2 diabetes mellitus without complications: Secondary | ICD-10-CM

## 2023-06-25 DIAGNOSIS — I4891 Unspecified atrial fibrillation: Secondary | ICD-10-CM

## 2023-06-25 LAB — GLUCOSE, CAPILLARY
Glucose-Capillary: 141 mg/dL — ABNORMAL HIGH (ref 70–99)
Glucose-Capillary: 157 mg/dL — ABNORMAL HIGH (ref 70–99)
Glucose-Capillary: 103 mg/dL — ABNORMAL HIGH (ref 70–99)

## 2023-06-25 LAB — BASIC METABOLIC PANEL WITH GFR
Anion gap: 6 (ref 5–15)
BUN: 22 mg/dL (ref 8–23)
CO2: 27 mmol/L (ref 22–32)
Calcium: 9.1 mg/dL (ref 8.9–10.3)
Chloride: 105 mmol/L (ref 98–111)
Creatinine, Ser: 1.69 mg/dL — ABNORMAL HIGH (ref 0.61–1.24)
GFR, Estimated: 39 mL/min — ABNORMAL LOW (ref >60)
Glucose, Bld: 131 mg/dL — ABNORMAL HIGH (ref 70–99)
Potassium: 3.8 mmol/L (ref 3.5–5.1)
Sodium: 138 mmol/L (ref 135–145)

## 2023-06-25 LAB — CBC
HCT: 43.6 % (ref 39.0–52.0)
Hemoglobin: 14.3 g/dL (ref 13.0–17.0)
MCH: 31 pg (ref 26.0–34.0)
MCHC: 32.8 g/dL (ref 30.0–36.0)
MCV: 94.4 fL (ref 80.0–100.0)
Platelets: 152 10*3/uL (ref 150–400)
RBC: 4.62 MIL/uL (ref 4.22–5.81)
RDW: 15.3 % (ref 11.5–15.5)
WBC: 5.5 10*3/uL (ref 4.0–10.5)
nRBC: 0 % (ref 0.0–0.2)

## 2023-06-25 LAB — MAGNESIUM: Magnesium: 2 mg/dL (ref 1.7–2.4)

## 2023-06-25 MED ORDER — AMIODARONE IV BOLUS ONLY 150 MG/100ML
150.0000 mg | Freq: Once | INTRAVENOUS | Status: AC
  Administered 2023-06-25: 150 mg via INTRAVENOUS

## 2023-06-25 MED ORDER — POTASSIUM CHLORIDE 20 MEQ PO PACK
40.0000 meq | PACK | Freq: Two times a day (BID) | ORAL | Status: AC
  Administered 2023-06-25 (×2): 40 meq via ORAL
  Filled 2023-06-25 (×2): qty 2

## 2023-06-25 MED ORDER — METOPROLOL TARTRATE 25 MG PO TABS
25.0000 mg | ORAL_TABLET | Freq: Two times a day (BID) | ORAL | Status: DC
  Administered 2023-06-25 – 2023-06-26 (×3): 25 mg via ORAL
  Filled 2023-06-25 (×3): qty 1

## 2023-06-25 MED ORDER — VERAPAMIL HCL 40 MG PO TABS
40.0000 mg | ORAL_TABLET | Freq: Three times a day (TID) | ORAL | Status: DC
  Administered 2023-06-25 – 2023-06-27 (×7): 40 mg via ORAL
  Filled 2023-06-25 (×8): qty 1

## 2023-06-25 MED ORDER — EDOXABAN TOSYLATE 30 MG PO TABS
30.0000 mg | ORAL_TABLET | ORAL | Status: DC
  Administered 2023-06-25 – 2023-06-27 (×3): 30 mg via ORAL
  Filled 2023-06-25 (×3): qty 1

## 2023-06-25 MED ORDER — MAGNESIUM SULFATE 2 GM/50ML IV SOLN
2.0000 g | Freq: Once | INTRAVENOUS | Status: AC
  Administered 2023-06-25: 2 g via INTRAVENOUS
  Filled 2023-06-25: qty 50

## 2023-06-25 NOTE — Progress Notes (Signed)
 Cardiology and Dr. Gordy Lauber notified patient is having SVT 170-180's. Orders for amiodarone bolus and 2 grams mag. Per Cardiology will be coming into evaluate patient. Dr. Gordy Lauber in room to assess patient. No additional orders.

## 2023-06-25 NOTE — Progress Notes (Signed)
 PROGRESS NOTE    [name]  BMW:[id number] DOB: 1933-05-02 DOA: 06/23/2023 PCP: Laurence Pons, NP  IC10A/IC10A-AA  LOS: 2 days   Brief hospital course:   Assessment & Plan: [name] is a 89 y.o. male with medical history significant of permanent atrial fibrillation on edoxaban , sick sinus syndrome s/p PPM, HFpEF with last EF of 55-60%, CKD stage IIIb, hypertension, hyperlipidemia, type 2 diabetes, stage IVb metastatic prostate cancer on Xtandi  and leuprolide , DVT who presents to the ED due to altered mental status.  Upon arriving the hospital, patient was hypotensive of 70/60 with heart rate 180.  EKG showed monomorphic ventricular tachycardia which was converted emergently in the emergency room.  Peak troponin elevated to 1100. Patient was placed on heparin  drip, aspirin , Lipitor.  Seen by cardiology, scheduled for heart cath 6/4.   * Monomorphic ventricular tachycardia  Patient reports with AMS and found to be in monomorphic VT with HR in 180s in ED. Patient underwent cardioversion. EKG in ED s/p cardioversion.  Started on amio gtt. --cont amio gtt --resume home lopressor  --start verampil 40 mg daily, per EP  NSTEMI, ruled out Trop elevation 2/2 demand ischemia --LHC on 6/3 showed insignificant CAD  Acute on chronic combined systolic and diastolic congestive heart failure.  Acute metabolic encephalopathy secondary to ventricular tachycardia Hypotension secondary to ventricular tachycardia --both resolved  Perm Afib --resume home edoxaban    Hypothermia Patient was noted to be hypothermic on presentation, with improvement in temperature post cardioversion.  Cortisol level elevated.   Prostate cancer [facility]) Patient has a history of stage IVb prostate cancer with regional lymphadenopathy, on Xtandi  and leuprolide . - Continue home regimen   COPD (chronic obstructive pulmonary disease) (HCC) Chronic hypoxemic respiratory failure on 2L O2 No exacerbation.    OSA (obstructive sleep apnea) - cont CPAP nightly   Diabetes mellitus (HCC) - Hold home Ozempic  and Farxiga  --d/c BG checks, no need   CKD (chronic kidney disease), stage IV (HCC) Per chart review, patient has a history of CKD stage IIIb-4, with creatinine ranging between 1.7 and 2.3, currently 2.0.    DVT prophylaxis: On:edoxaban  Code Status: Full code  Family Communication:  Level of care: Stepdown Dispo:   The patient is from: home Anticipated d/c is to: to be determined Anticipated d/c date is: 2-3 days   Subjective and Interval History:  Pt had elevated HR this morning, asymptomatic.  Denied dyspnea.   Objective: Vitals:   06/25/23 1540 06/25/23 1600 06/25/23 1630 06/25/23 1700  BP: 110/70 (!) 139/90 (!) 145/87 137/87  Pulse: 71 71 69 70  Resp: (!) 24 18 (!) 21 18  Temp:      TempSrc:      SpO2: 100% 100% 99% 98%  Weight:      Height:        Intake/Output Summary (Last 24 hours) at 06/25/2023 1745 Last data filed at 06/25/2023 1638 Gross per 24 hour  Intake 1506.25 ml  Output 850 ml  Net 656.25 ml   Filed Weights   06/24/23 0500 06/24/23 1205 06/25/23 0543  Weight: 106.4 kg 106.4 kg 106 kg    Examination:   Constitutional: NAD, AAOx3 HEENT: conjunctivae and lids normal, EOMI CV: No cyanosis.   RESP: normal respiratory effort, on 2L Neuro: II - XII grossly intact.   Psych: Normal mood and affect.     Data Reviewed: I have personally reviewed labs and imaging studies  Time spent: 50 minutes  Garrison Kanner, MD [facility] If 7PM-7AM, please  contact night-coverage 06/25/2023, 5:45 PM

## 2023-06-25 NOTE — Plan of Care (Signed)
  Problem: Fluid Volume: Goal: Ability to maintain a balanced intake and output will improve Outcome: Progressing   Problem: Clinical Measurements: Goal: Ability to maintain clinical measurements within normal limits will improve Outcome: Progressing   Problem: Clinical Measurements: Goal: Cardiovascular complication will be avoided Outcome: Progressing   Problem: Pain Managment: Goal: General experience of comfort will improve and/or be controlled Outcome: Progressing   Problem: Cardiovascular: Goal: Ability to achieve and maintain adequate cardiovascular perfusion will improve Outcome: Progressing   Problem: Cardiovascular: Goal: Vascular access site(s) Level 0-1 will be maintained Outcome: Progressing

## 2023-06-25 NOTE — Progress Notes (Signed)
 Per Cardiology into see patient. MD states to notify him if patients heart rate sustains above 150 for 5 minutes. Continue to assess.

## 2023-06-25 NOTE — Progress Notes (Addendum)
 [facility] CARDIOLOGY PROGRESS NOTE       Patient ID: [name] MRN: [id number] DOB/AGE: 88/10/1933 88 y.o.  Admit date: 06/23/2023 Referring Physician Dr. Avi Body Primary Physician Laurence Pons, NP Primary Cardiologist Dr. Beau Bound Reason for Consultation monomorphic VT, elevated trops  HPI: [name] is a 89 y.o. male  with a past medical history of permanent atrial fibrillation (on edoxaban ), chronic HFpEF, hypertension, hyperlipidemia, OSA, CKD stage 3b, infrarenal AAA, COPD, SSS s/p micra, T2DM, stage Ivb metastatic prostate cancer who presented to the ED on 06/23/2023 for altered mental status. On arrival to ED patient was hypotensive with HR in 180s and EKG revealed monomorphic ventricular tachycardia requiring cardioversion. Troponins elevated. Cardiology was consulted for further evaluation.   Interval History: -Patient seen and examined this AM and laying comfortably in hospital bed  Patient states she feels fine this AM and denies chest pain, SOB or palpitations.  -Patients BP and HR  elevated this AM. Per tele this AM with very frequent runs of tachycardia including sustained wide complex tachycardia. Patient received 1x 150 mg amio bolus and IV 2g Mg this AM. EP following. -Patient remains on room air with stable SpO2.  -Underwent LHC with Dr. Parks Bollman (06/03) and revealed 40% stenosed Ost/prox LAD, mild LV systolic dysfunction.   -Incision site is clean and dry with no evidence of significant swelling, bruising, or active bleeding. Hgb stable and Cr improved this morning after catheterization .    Stress Test: 06/2022  Probably normal pharmacologic myocardial perfusion stress test without evidence of significant ischemia or scar.   Left ventricular systolic function is normal (LVEF 55%).   Leadless pacemaker noted at the RV apex.  Coronary artery calcifications are noted on the attenuation correction CT.   Elevation of the right hemidiaphragm  again noted, as well as small calcifications in the visualized left kidney compatible with previously described renal calculi.   This is a low risk study.  Sensitivity and specificity are degraded by significant extracardiac activity and diaphragmatic attenuation.  Review of systems complete and found to be negative unless listed above    Past Medical History:  Diagnosis Date   Acute deep vein thrombosis (DVT) of femoral vein of left lower extremity (HCC)    Atrial fibrillation (HCC)    CHF (congestive heart failure) (HCC)    Chronic kidney disease    Diabetes (HCC)    Hearing loss    High blood pressure    History of bladder problems    Hyperlipidemia    Kidney stones 11/22/2014   Liver cyst    OSA (obstructive sleep apnea)    Prostate cancer (HCC)    Prostate cancer (HCC)    Stroke (HCC)    Valvular heart disease     Past Surgical History:  Procedure Laterality Date   CATARACT EXTRACTION     COLONOSCOPY WITH PROPOFOL  N/A 07/02/2019   Procedure: COLONOSCOPY WITH PROPOFOL ;  Surgeon: Selena Daily, MD;  Location: [facility] ENDOSCOPY;  Service: Gastroenterology;  Laterality: N/A;   ESOPHAGOGASTRODUODENOSCOPY (EGD) WITH PROPOFOL  N/A 03/19/2022   Procedure: ESOPHAGOGASTRODUODENOSCOPY (EGD) WITH PROPOFOL ;  Surgeon: Luke Salaam, MD;  Location: [facility] ENDOSCOPY;  Service: Gastroenterology;  Laterality: N/A;   IR CT HEAD LTD  08/27/2019   IR PERCUTANEOUS ART THROMBECTOMY/INFUSION INTRACRANIAL INC DIAG ANGIO  08/27/2019       IR PERCUTANEOUS ART THROMBECTOMY/INFUSION INTRACRANIAL INC DIAG ANGIO  08/27/2019   LEFT HEART CATH AND CORONARY ANGIOGRAPHY N/A 06/24/2023   Procedure: LEFT HEART CATH  AND CORONARY ANGIOGRAPHY;  Surgeon: Percival Brace, MD;  Location: [facility] INVASIVE CV LAB;  Service: Cardiovascular;  Laterality: N/A;   LOWER EXTREMITY ANGIOGRAPHY Left 12/25/2020   Procedure: Lower Extremity Angiography;  Surgeon: Celso College, MD;  Location: [facility] INVASIVE CV LAB;  Service:  Cardiovascular;  Laterality: Left;   PACEMAKER LEADLESS INSERTION N/A 12/19/2020   Procedure: PACEMAKER LEADLESS INSERTION;  Surgeon: Percival Brace, MD;  Location: [facility] INVASIVE CV LAB;  Service: Cardiovascular;  Laterality: N/A;   PROSTATE CANCER     PROSTATE SURGERY     RADIOLOGY WITH ANESTHESIA N/A 08/27/2019   Procedure: IR WITH ANESTHESIA;  Surgeon: Luellen Sages, MD;  Location: [facility] OR;  Service: Radiology;  Laterality: N/A;    Medications Prior to Admission  Medication Sig Dispense Refill Last Dose/Taking   albuterol  (VENTOLIN  HFA) 108 (90 Base) MCG/ACT inhaler Inhale 2 puffs into the lungs every 4 (four) hours as needed for wheezing or shortness of breath. 18 g 5 Taking As Needed   atorvastatin  (LIPITOR) 10 MG tablet TAKE 1 TABLET(10 MG) BY MOUTH DAILY 90 tablet 1 Taking   dapagliflozin  propanediol (FARXIGA ) 10 MG TABS tablet Take 1 tablet (10 mg total) by mouth daily. 30 tablet 5 Taking   edoxaban  (SAVAYSA ) 30 MG TABS tablet Take 30 mg by mouth daily.   Taking   enzalutamide  (XTANDI ) 40 MG tablet Take 4 tablets (160 mg total) by mouth daily. 120 tablet 2 Taking   furosemide  (LASIX ) 20 MG tablet Take 1 tablet (20 mg total) by mouth daily. 90 tablet 3 Taking   Metoprolol  Tartrate 37.5 MG TABS Take 1 tablet (37.5 mg total) by mouth 2 (two) times daily. 60 tablet 11 Taking   pantoprazole  (PROTONIX ) 40 MG tablet Take 1 tablet (40 mg total) by mouth daily. 90 tablet 3 Taking   Tiotropium Bromide  Monohydrate (SPIRIVA  RESPIMAT) 2.5 MCG/ACT AERS Inhale 2 puffs into the lungs daily. 4 g 3 Taking   Cholecalciferol (VITAMIN D-3 PO) Take 1 capsule by mouth daily.   Unknown   ferrous sulfate  (FEROSUL) 325 (65 FE) MG tablet Take 1 tablet by mouth daily.   Unknown   glucose blood (ONETOUCH VERIO) test strip USE ONE STRIP DAILY 100 strip 3    Semaglutide ,0.25 or 0.5MG /DOS, (OZEMPIC , 0.25 OR 0.5 MG/DOSE,) 2 MG/3ML SOPN Inject 0.5 mg into the skin once a week.   Unknown   vitamin B-12  (CYANOCOBALAMIN ) 1000 MCG tablet Take 1 tablet (1,000 mcg total) by mouth daily. 30 tablet 0 Unknown   Social History   Socioeconomic History   Marital status: Married    Spouse name: Roddy Bellamy   Number of children: 2   Years of education: 12   Highest education level: 12th grade  Occupational History   Occupation: Airport  Tobacco Use   Smoking status: Former    Types: Cigarettes   Smokeless tobacco: Never  Vaping Use   Vaping status: Never Used  Substance and Sexual Activity   Alcohol use: No   Drug use: No   Sexual activity: Not Currently  Other Topics Concern   Not on file  Social History Narrative   Not on file   Social Drivers of Health   Financial Resource Strain: Low Risk  (10/29/2019)   Overall Financial Resource Strain (CARDIA)    Difficulty of Paying Living Expenses: Not very hard  Recent Concern: Financial Resource Strain - Medium Risk (09/08/2019)   Overall Financial Resource Strain (CARDIA)    Difficulty of Paying Living Expenses: Somewhat hard  Food Insecurity: No Food Insecurity (06/23/2023)   Hunger Vital Sign    Worried About Running Out of Food in the Last Year: Never true    Ran Out of Food in the Last Year: Never true  Transportation Needs: No Transportation Needs (06/23/2023)   PRAPARE - Administrator, Civil Service (Medical): No    Lack of Transportation (Non-Medical): No  Physical Activity: Inactive (10/29/2019)   Exercise Vital Sign    Days of Exercise per Week: 0 days    Minutes of Exercise per Session: 0 min  Stress: No Stress Concern Present (10/29/2019)   [employer] of Occupational Health - Occupational Stress Questionnaire    Feeling of Stress : Only a little  Recent Concern: Stress - Stress Concern Present (09/08/2019)   [employer] of Occupational Health - Occupational Stress Questionnaire    Feeling of Stress : To some extent  Social Connections: Moderately Integrated (06/23/2023)   Social Connection and  Isolation Panel [NHANES]    Frequency of Communication with Friends and Family: More than three times a week    Frequency of Social Gatherings with Friends and Family: More than three times a week    Attends Religious Services: More than 4 times per year    Active Member of [employer] or Organizations: No    Attends Banker Meetings: Never    Marital Status: Married  Catering manager Violence: Not At Risk (06/23/2023)   Humiliation, Afraid, Rape, and Kick questionnaire    Fear of Current or Ex-Partner: No    Emotionally Abused: No    Physically Abused: No    Sexually Abused: No    Family History  Problem Relation Age of Onset   Colon cancer Mother    Diabetes Other    High blood pressure Other    Prostate cancer Brother    Diabetes Brother      Vitals:   06/25/23 0730 06/25/23 0800 06/25/23 0829 06/25/23 0830  BP: (!) 141/96 (!) 143/103 (!) 133/91 (!) 133/91  Pulse: (!) 105 (!) 46  91  Resp: (!) 21 17  (!) 21  Temp:      TempSrc:      SpO2: 93% 91%  96%  Weight:      Height:        PHYSICAL EXAM General: well appearing elderly male, well nourished, in no acute distress. HEENT: Normocephalic and atraumatic. Neck: No JVD.   Lungs: Normal respiratory effort on room air. Clear bilaterally to auscultation. No wheezes, crackles, rhonchi.  Heart: HRRR (paced). Normal S1 and S2 without gallops or murmurs.  Abdomen: Non-distended appearing.  Msk: Normal strength and tone for age.Right groin incision site is clean and dry with no evidence of significant swelling, bruising, or active bleeding. Extremities: Warm and well perfused. No clubbing, cyanosis. No edema.  Neuro: Alert and oriented X 3. Psych: Answers questions appropriately.   Labs: Basic Metabolic Panel: Recent Labs    06/24/23 0329 06/25/23 0355  NA 142 138  K 3.7 3.8  CL 106 105  CO2 29 27  GLUCOSE 112* 131*  BUN 28* 22  CREATININE 1.83* 1.69*  CALCIUM  9.1 9.1  MG 2.1 2.0   Liver Function  Tests: Recent Labs    06/23/23 1857 06/24/23 0329  AST 19 26  ALT 11 16  ALKPHOS 107 106  BILITOT 0.6 0.5  PROT 6.4* 6.7  ALBUMIN  3.0* 3.2*   No results for input(s): "LIPASE", "AMYLASE" in the last 72 hours. CBC:  Recent Labs    06/23/23 1857 06/24/23 0329 06/25/23 0355  WBC 5.4 4.9 5.5  NEUTROABS 2.4  --   --   HGB 14.0 14.7 14.3  HCT 43.2 45.7 43.6  MCV 96.0 96.0 94.4  PLT 166 154 152   Cardiac Enzymes: Recent Labs    06/24/23 0329 06/24/23 0827 06/24/23 1012  TROPONINIHS 1,144* 1,158* 927*   BNP: Recent Labs    06/23/23 1857  BNP 495.0*   D-Dimer: No results for input(s): "DDIMER" in the last 72 hours. Hemoglobin A1C: No results for input(s): "HGBA1C" in the last 72 hours. Fasting Lipid Panel: No results for input(s): "CHOL", "HDL", "LDLCALC", "TRIG", "CHOLHDL", "LDLDIRECT" in the last 72 hours. Thyroid  Function Tests: Recent Labs    06/24/23 0827  TSH 1.983   Anemia Panel: No results for input(s): "VITAMINB12", "FOLATE", "FERRITIN", "TIBC", "IRON", "RETICCTPCT" in the last 72 hours.   Radiology: CARDIAC CATHETERIZATION Result Date: 06/24/2023   Ost LAD to Prox LAD lesion is 40% stenosed.   There is mild left ventricular systolic dysfunction.   The left ventricular ejection fraction is 35-45% by visual estimate. 1.  Insignificant coronary artery disease with 40-50% ostial/proximal LAD 2.  Mild reduced left ventricular function with estimate LVEF 40% Recommendations 1.  Medical therapy 2.  Aggressive risk factor modification   ECHOCARDIOGRAM COMPLETE Result Date: 06/24/2023    ECHOCARDIOGRAM REPORT   Patient Name:   [name] Date of Exam: 06/24/2023 Medical Rec #:  [id number]         Height:       72.0 in Accession #:    [id number]        Weight:       234.6 lb Date of Birth:  [birth year]          BSA:          2.280 m Patient Age:    89 years          BP:           136/117 mmHg Patient Gender: M                 HR:           70 bpm. Exam Location:  [facility]  Procedure: 2D Echo, Cardiac Doppler and Color Doppler (Both Spectral and Color            Flow Doppler were utilized during procedure). Indications:     Ventricular tachycardia  History:         Patient has prior history of Echocardiogram examinations, most                  recent 07/09/2022. CHF, Stroke, Arrythmias:Atrial Fibrillation;                  Risk Factors:Sleep Apnea.  Sonographer:     Broadus Canes Referring Phys:  [id number] Avi Body Diagnosing Phys: Lida Reeks Alluri IMPRESSIONS  1. Technically difficult study.  2. Left ventricular ejection fraction, by estimation, is 45 to 50%. The left ventricle has mildly decreased function. The left ventricle demonstrates regional wall motion abnormalities (see scoring diagram/findings for description). There is moderate left ventricular hypertrophy. Left ventricular diastolic parameters are indeterminate.  3. Right ventricular systolic function was not well visualized. The right ventricular size is not well visualized.  4. Left atrial size was moderately dilated.  5. The mitral valve is normal in structure. Trivial mitral valve regurgitation.  6. The aortic valve is tricuspid. Aortic valve regurgitation  is not visualized. FINDINGS  Left Ventricle: Left ventricular ejection fraction, by estimation, is 45 to 50%. The left ventricle has mildly decreased function. The left ventricle demonstrates regional wall motion abnormalities. The left ventricular internal cavity size was normal in size. There is moderate left ventricular hypertrophy. Left ventricular diastolic parameters are indeterminate.  LV Wall Scoring: The antero-lateral wall, posterior wall, and basal inferior segment are hypokinetic. The entire anterior wall, entire septum, entire apex, and mid and distal inferior wall are normal. Right Ventricle: The right ventricular size is not well visualized. Right vetricular wall thickness was not well visualized. Right ventricular systolic function was not well  visualized. Left Atrium: Left atrial size was moderately dilated. Right Atrium: Right atrial size was not well visualized. Pericardium: There is no evidence of pericardial effusion. Mitral Valve: The mitral valve is normal in structure. Trivial mitral valve regurgitation. Tricuspid Valve: The tricuspid valve is not well visualized. Tricuspid valve regurgitation is not demonstrated. Aortic Valve: The aortic valve is tricuspid. Aortic valve regurgitation is not visualized. Aortic valve mean gradient measures 1.5 mmHg. Aortic valve peak gradient measures 2.2 mmHg. Aortic valve area, by VTI measures 2.85 cm. Pulmonic Valve: The pulmonic valve was not well visualized. Pulmonic valve regurgitation is not visualized. Aorta: The aortic root is normal in size and structure. IAS/Shunts: The interatrial septum was not well visualized.  LEFT VENTRICLE PLAX 2D LVIDd:         4.20 cm     Diastology LVIDs:         3.60 cm     LV e' medial:    6.04 cm/s LV PW:         1.30 cm     LV E/e' medial:  13.8 LV IVS:        1.40 cm     LV e' lateral:   9.25 cm/s LVOT diam:     2.20 cm     LV E/e' lateral: 9.0 LV SV:         34 LV SV Index:   15 LVOT Area:     3.80 cm  LV Volumes (MOD) LV vol d, MOD A2C: 73.2 ml LV vol d, MOD A4C: 78.5 ml LV vol s, MOD A2C: 36.4 ml LV vol s, MOD A4C: 38.0 ml LV SV MOD A2C:     36.8 ml LV SV MOD A4C:     78.5 ml LV SV MOD BP:      38.5 ml RIGHT VENTRICLE RV Basal diam:  3.90 cm RV Mid diam:    3.50 cm LEFT ATRIUM              Index        RIGHT ATRIUM           Index LA diam:        6.40 cm  2.81 cm/m   RA Area:     28.40 cm LA Vol (A2C):   117.0 ml 51.32 ml/m  RA Volume:   85.80 ml  37.63 ml/m LA Vol (A4C):   69.5 ml  30.48 ml/m LA Biplane Vol: 94.8 ml  41.58 ml/m  AORTIC VALVE AV Area (Vmax):    2.24 cm AV Area (Vmean):   2.20 cm AV Area (VTI):     2.85 cm AV Vmax:           74.60 cm/s AV Vmean:          48.650 cm/s AV VTI:  0.118 m AV Peak Grad:      2.2 mmHg AV Mean Grad:      1.5  mmHg LVOT Vmax:         43.90 cm/s LVOT Vmean:        28.200 cm/s LVOT VTI:          0.089 m LVOT/AV VTI ratio: 0.75  AORTA Ao Root diam: 3.70 cm MITRAL VALVE               TRICUSPID VALVE MV Area (PHT): 5.34 cm    TR Peak grad:   9.5 mmHg MV Decel Time: 142 msec    TR Vmax:        154.00 cm/s MV E velocity: 83.30 cm/s                            SHUNTS                            Systemic VTI:  0.09 m                            Systemic Diam: 2.20 cm Joetta Mustache Electronically signed by Joetta Mustache Signature Date/Time: 06/24/2023/11:49:00 AM    Final    DG Chest Portable 1 View Result Date: 06/23/2023 CLINICAL DATA:  vtach, cardioverted EXAM: PORTABLE CHEST 1 VIEW COMPARISON:  Chest radiograph 09/21/2022 FINDINGS: Lung volumes remain low. Cardiomegaly is stable. Mediastinal contours are unchanged. Mild vascular congestion. No focal airspace disease. No pneumothorax or large pleural effusion. On limited assessment, no acute osseous findings. IMPRESSION: Low lung volumes with cardiomegaly and mild vascular congestion. Electronically Signed   By: Chadwick Colonel M.D.   On: 06/23/2023 20:04    ECHO as above  TELEMETRY reviewed by me 06/25/2023: Frequent runs of nonsustained tachycardia with intermittent V-pacing, rate 90s  EKG reviewed by me: monomorphic wide complex ventricular tachycardia, rate 181 bpm.  Data reviewed by me 06/25/2023: last 24h vitals tele labs imaging I/O ED provider note, admission H&P.  Principal Problem:   Monomorphic ventricular tachycardia (HCC) Active Problems:   CKD (chronic kidney disease), stage IV (HCC)   Diabetes mellitus (HCC)   OSA (obstructive sleep apnea)   Acute on chronic heart failure with preserved ejection fraction (HFpEF) (HCC)   NSTEMI (non-ST elevated myocardial infarction) (HCC)   COPD (chronic obstructive pulmonary disease) (HCC)   Chronic a-fib (HCC)   Prostate cancer (HCC)   Hypothermia    ASSESSMENT AND PLAN:  [name] is a 89 y.o.  male  with a past medical history of permanent atrial fibrillation (on edoxaban ), chronic HFpEF, hypertension, hyperlipidemia, OSA, CKD stage 3b, infrarenal AAA, COPD, SSS s/p micra, T2DM, stage Ivb metastatic prostate cancer who presented to the ED on 06/23/2023 for altered mental status. On arrival to ED patient was hypotensive with HR in 180s and EKG revealed monomorphic ventricular tachycardia requiring cardioversion. Troponins elevated. Cardiology was consulted for further evaluation.   # Monomorphic Ventricular Tachycardia s/p cardioversion  # NSTEMI # Acute on chronic HFpEF (newly mildly reduced 45-50%) # Permanent Atrial fibrillation # Hypertension # Hyperlipidemia Patient reports with AMS and found to be in monomorphic VT with HR in 180s in ED. Patient underwent cardioversion. EKG in ED s/p cardioversion at 2211 with ventricular paced rhythm, rate 80s. BNP 490. Trops elevated and trending 16 > 170 > 920 >  1140>1150>920 . Echo this admission with newly mildly reduced EF (45-50%) with anterolateral, posterior wall  and basal inferior segment are hypokinetic, mod LVH (new abnormalities compared to prior echo). Echo from 06/2023 with pEF (55-60%) and no RWMA. Underwent LHC with Dr. Parks Bollman (06/03) and revealed 40% stenosed Ost/prox LAD, mild LV systolic dysfunction. Right groin incision site is clean and dry with no evidence of significant swelling, bruising, or active bleeding. Hgb stable and Cr improved this morning after catheterization. Per tele this AM with very frequent runs of tachycardia including sustained wide complex tachycardia. -Continue ASA 81 mg, atorvastatin  10 mg daily -Monitor and replenish electrolytes for a goal K >4, Mag >2  -Resumed home edoxaban  30 mg daily for stroke risk reduction. -Resume home metoprolol  tartarte 25 mg BID. -Continue amio gtt. (Patient received 1x amio 150 mg bolus and IV Mg 2g this AM) -Per EP - started verampil 40 mg daily.  -Consider resuming home  GDMT dapagliflozin  prior to discharge if renal function allows.  -EP consulted. Appreciate recommendations.   -Recommend palliative consult.   This patient's plan of care was discussed and created with Dr. Bob Burn and he is in agreement.  Signed: Creighton Doffing, PA-C  06/25/2023, 8:43 AM [facility] Cardiology

## 2023-06-25 NOTE — Progress Notes (Signed)
  Patient Name: ASAAD GULLEY Date of Encounter: 06/25/2023  Primary Cardiologist: Constancia Delton, MD Electrophysiologist: Boyce Byes, MD  Interval Summary   Patient having runs of NSVT this AM. Sitting up in bed eating breakfast, completely asymptomatic.   S/p TTE and LHC yesterday with insignificant CAD    Vital Signs    Vitals:   06/25/23 0730 06/25/23 0800 06/25/23 0829 06/25/23 0830  BP: (!) 141/96 (!) 143/103 (!) 133/91 (!) 133/91  Pulse: (!) 105 (!) 46  91  Resp: (!) 21 17  (!) 21  Temp:      TempSrc:      SpO2: 93% 91%  96%  Weight:      Height:        Intake/Output Summary (Last 24 hours) at 06/25/2023 0900 Last data filed at 06/25/2023 0736 Gross per 24 hour  Intake 1536.7 ml  Output 500 ml  Net 1036.7 ml   Filed Weights   06/24/23 0500 06/24/23 1205 06/25/23 0543  Weight: 106.4 kg 106.4 kg 106 kg    Physical Exam    GEN- The patient is well appearing, alert and oriented x 3 today.   Lungs- Clear to ausculation bilaterally, normal work of breathing Cardiac- Irregularly irregular rate and rhythm, no murmurs, rubs or gallops GI- soft, NT, ND, + BS Extremities- no clubbing or cyanosis. No edema  Telemetry    VP with frequent NSVT runs varying from 3-25 bts (personally reviewed)  Hospital Course    DEMETRIAS GOODBAR is a 89 y.o. male with a history of perm AFib, SND s/p leadless PPM, HFpEF, CKD 3b, HTN, T2DM, metastatic prostate cancer, HLD, COPD, OSA admitted for MM VT.   TTE 6/4 - LVEF reduced to 45-50% (previously normal)  LHC 6/4 - 1.  Insignificant coronary artery disease with 40-50% ostial/proximal LAD. 2.  Mild reduced left ventricular function with estimate LVEF 40%   Assessment & Plan    #) MM VT Appears left anterior fascicular VT by EKG Updated TTE yesterday with mildly reduced LVEF.  LHC without significant CAD Continues to have NSVT this morning, asymptomatic with robust BP Already re-bolused 150mg  amiodarone this  AM Continue 30mg /hr amiodarone at this time Continue 25mg  lopressor  BID Start 40mg  verapamil  TID   #) perm AFib Resume home edoxaban  when appropriate per interventional cards   #) CKD #) T2DM #) metastatic prostate cancer Mgmt per primary team      For questions or updates, please contact [facility] Please consult [email or web address] for contact info under     Signed, Sulay Brymer, NP  06/25/2023, 9:00 AM

## 2023-06-26 ENCOUNTER — Other Ambulatory Visit: Payer: Self-pay

## 2023-06-26 DIAGNOSIS — I4729 Other ventricular tachycardia: Secondary | ICD-10-CM | POA: Diagnosis not present

## 2023-06-26 LAB — BASIC METABOLIC PANEL WITH GFR
Anion gap: 6 (ref 5–15)
BUN: 24 mg/dL — ABNORMAL HIGH (ref 8–23)
CO2: 26 mmol/L (ref 22–32)
Calcium: 8.9 mg/dL (ref 8.9–10.3)
Chloride: 104 mmol/L (ref 98–111)
Creatinine, Ser: 1.76 mg/dL — ABNORMAL HIGH (ref 0.61–1.24)
GFR, Estimated: 37 mL/min — ABNORMAL LOW (ref >60)
Glucose, Bld: 135 mg/dL — ABNORMAL HIGH (ref 70–99)
Potassium: 4.6 mmol/L (ref 3.5–5.1)
Sodium: 136 mmol/L (ref 135–145)

## 2023-06-26 LAB — LIPOPROTEIN A (LPA): Lipoprotein (a): 161.7 nmol/L — ABNORMAL HIGH

## 2023-06-26 LAB — D-DIMER, QUANTITATIVE: D-Dimer, Quant: 0.67 ug{FEU}/mL — ABNORMAL HIGH (ref 0.00–0.50)

## 2023-06-26 LAB — CBC
HCT: 44.5 % (ref 39.0–52.0)
Hemoglobin: 14.4 g/dL (ref 13.0–17.0)
MCH: 30.7 pg (ref 26.0–34.0)
MCHC: 32.4 g/dL (ref 30.0–36.0)
MCV: 94.9 fL (ref 80.0–100.0)
Platelets: 131 10*3/uL — ABNORMAL LOW (ref 150–400)
RBC: 4.69 MIL/uL (ref 4.22–5.81)
RDW: 15.1 % (ref 11.5–15.5)
WBC: 5.6 10*3/uL (ref 4.0–10.5)
nRBC: 0 % (ref 0.0–0.2)

## 2023-06-26 LAB — MAGNESIUM: Magnesium: 2.4 mg/dL (ref 1.7–2.4)

## 2023-06-26 MED ORDER — ACETAMINOPHEN 500 MG PO TABS: 1000.0000 mg | ORAL_TABLET | Freq: Three times a day (TID) | ORAL | Status: DC | PRN

## 2023-06-26 MED ORDER — AMIODARONE HCL 200 MG PO TABS: 400.0000 mg | ORAL_TABLET | Freq: Every day | ORAL | Status: DC

## 2023-06-26 MED ORDER — KETOROLAC TROMETHAMINE 15 MG/ML IJ SOLN
15.0000 mg | Freq: Once | INTRAMUSCULAR | Status: AC | PRN
  Administered 2023-06-26: 15 mg via INTRAVENOUS
  Filled 2023-06-26: qty 1

## 2023-06-26 MED ORDER — NITROGLYCERIN 0.4 MG SL SUBL
SUBLINGUAL_TABLET | SUBLINGUAL | Status: AC
  Filled 2023-06-26: qty 1

## 2023-06-26 MED ORDER — SIMETHICONE 80 MG PO CHEW
80.0000 mg | CHEWABLE_TABLET | Freq: Once | ORAL | Status: AC
  Administered 2023-06-26: 80 mg via ORAL
  Filled 2023-06-26: qty 1

## 2023-06-26 MED ORDER — AMIODARONE HCL 200 MG PO TABS: 200.0000 mg | ORAL_TABLET | Freq: Every day | ORAL | Status: DC

## 2023-06-26 MED ORDER — MORPHINE SULFATE (PF) 2 MG/ML IV SOLN
1.0000 mg | INTRAVENOUS | Status: AC | PRN
  Administered 2023-06-26: 1 mg via INTRAVENOUS

## 2023-06-26 MED ORDER — MORPHINE SULFATE (PF) 2 MG/ML IV SOLN
INTRAVENOUS | Status: AC
  Filled 2023-06-26: qty 1

## 2023-06-26 MED ORDER — AMIODARONE HCL 200 MG PO TABS
400.0000 mg | ORAL_TABLET | Freq: Two times a day (BID) | ORAL | Status: DC
  Administered 2023-06-26 – 2023-06-27 (×3): 400 mg via ORAL
  Filled 2023-06-26 (×3): qty 2

## 2023-06-26 MED ORDER — NITROGLYCERIN 0.4 MG SL SUBL
0.4000 mg | SUBLINGUAL_TABLET | SUBLINGUAL | Status: DC | PRN
  Administered 2023-06-26: 0.4 mg via SUBLINGUAL

## 2023-06-26 NOTE — Progress Notes (Signed)
 [facility] CLINIC CARDIOLOGY PROGRESS NOTE       Patient ID: [name] MRN: [id number] DOB/AGE: [birth year] 89 y.o.  Admit date: 06/23/2023 Referring Physician Dr. Avi Body Primary Physician Laurence Pons, NP Primary Cardiologist Dr. Beau Bound Reason for Consultation monomorphic VT, elevated trops  HPI: [name] is a 89 y.o. male  with a past medical history of permanent atrial fibrillation (on edoxaban ), chronic HFpEF, hypertension, hyperlipidemia, OSA, CKD stage 3b, infrarenal AAA, COPD, SSS s/p micra, T2DM, stage Ivb metastatic prostate cancer who presented to the ED on 06/23/2023 for altered mental status. On arrival to ED patient was hypotensive with HR in 180s and EKG revealed monomorphic ventricular tachycardia requiring cardioversion. Troponins elevated. Cardiology was consulted for further evaluation.   Interval History: -Patient seen and examined this AM and laying comfortably in hospital bed eating breakfast.  Patient states he had chest pain that only occurred with deep breathing and rates 5/10. Upon palpation patient winces in pain, this is likely noncardiac, more likely pleuritic and MSK related. Denies SOB, or palpitations. -Patient given IV morphine  and tylenol  for pain. Will continue to monitor -Patients BP elevated and HR  stable this AM. There is significant improvement in NSVT. EP following. -Patient remains on room air with stable SpO2.    Review of systems complete and found to be negative unless listed above    Past Medical History:  Diagnosis Date   Acute deep vein thrombosis (DVT) of femoral vein of left lower extremity (HCC)    Atrial fibrillation (HCC)    CHF (congestive heart failure) (HCC)    Chronic kidney disease    Diabetes (HCC)    Hearing loss    High blood pressure    History of bladder problems    Hyperlipidemia    Kidney stones 11/22/2014   Liver cyst    OSA (obstructive sleep apnea)    Prostate cancer (HCC)    Prostate  cancer (HCC)    Stroke (HCC)    Valvular heart disease     Past Surgical History:  Procedure Laterality Date   CATARACT EXTRACTION     COLONOSCOPY WITH PROPOFOL  N/A 07/02/2019   Procedure: COLONOSCOPY WITH PROPOFOL ;  Surgeon: Selena Daily, MD;  Location: [facility] ENDOSCOPY;  Service: Gastroenterology;  Laterality: N/A;   ESOPHAGOGASTRODUODENOSCOPY (EGD) WITH PROPOFOL  N/A 03/19/2022   Procedure: ESOPHAGOGASTRODUODENOSCOPY (EGD) WITH PROPOFOL ;  Surgeon: Luke Salaam, MD;  Location: [facility] ENDOSCOPY;  Service: Gastroenterology;  Laterality: N/A;   IR CT HEAD LTD  08/27/2019   IR PERCUTANEOUS ART THROMBECTOMY/INFUSION INTRACRANIAL INC DIAG ANGIO  08/27/2019       IR PERCUTANEOUS ART THROMBECTOMY/INFUSION INTRACRANIAL INC DIAG ANGIO  08/27/2019   LEFT HEART CATH AND CORONARY ANGIOGRAPHY N/A 06/24/2023   Procedure: LEFT HEART CATH AND CORONARY ANGIOGRAPHY;  Surgeon: Percival Brace, MD;  Location: [facility] INVASIVE CV LAB;  Service: Cardiovascular;  Laterality: N/A;   LOWER EXTREMITY ANGIOGRAPHY Left 12/25/2020   Procedure: Lower Extremity Angiography;  Surgeon: Celso College, MD;  Location: [facility] INVASIVE CV LAB;  Service: Cardiovascular;  Laterality: Left;   PACEMAKER LEADLESS INSERTION N/A 12/19/2020   Procedure: PACEMAKER LEADLESS INSERTION;  Surgeon: Percival Brace, MD;  Location: [facility] INVASIVE CV LAB;  Service: Cardiovascular;  Laterality: N/A;   PROSTATE CANCER     PROSTATE SURGERY     RADIOLOGY WITH ANESTHESIA N/A 08/27/2019   Procedure: IR WITH ANESTHESIA;  Surgeon: Luellen Sages, MD;  Location: [facility] OR;  Service: Radiology;  Laterality: N/A;    Medications Prior to  Admission  Medication Sig Dispense Refill Last Dose/Taking   albuterol  (VENTOLIN  HFA) 108 (90 Base) MCG/ACT inhaler Inhale 2 puffs into the lungs every 4 (four) hours as needed for wheezing or shortness of breath. 18 g 5 Taking As Needed   atorvastatin  (LIPITOR) 10 MG tablet TAKE 1 TABLET(10 MG) BY MOUTH DAILY 90 tablet 1  Taking   dapagliflozin  propanediol (FARXIGA ) 10 MG TABS tablet Take 1 tablet (10 mg total) by mouth daily. 30 tablet 5 Taking   edoxaban  (SAVAYSA ) 30 MG TABS tablet Take 30 mg by mouth daily.   Taking   enzalutamide  (XTANDI ) 40 MG tablet Take 4 tablets (160 mg total) by mouth daily. 120 tablet 2 Taking   furosemide  (LASIX ) 20 MG tablet Take 1 tablet (20 mg total) by mouth daily. 90 tablet 3 Taking   Metoprolol  Tartrate 37.5 MG TABS Take 1 tablet (37.5 mg total) by mouth 2 (two) times daily. 60 tablet 11 Taking   pantoprazole  (PROTONIX ) 40 MG tablet Take 1 tablet (40 mg total) by mouth daily. 90 tablet 3 Taking   Tiotropium Bromide  Monohydrate (SPIRIVA  RESPIMAT) 2.5 MCG/ACT AERS Inhale 2 puffs into the lungs daily. 4 g 3 Taking   Cholecalciferol (VITAMIN D-3 PO) Take 1 capsule by mouth daily.   Unknown   ferrous sulfate  (FEROSUL) 325 (65 FE) MG tablet Take 1 tablet by mouth daily.   Unknown   glucose blood (ONETOUCH VERIO) test strip USE ONE STRIP DAILY 100 strip 3    Semaglutide ,0.25 or 0.5MG /DOS, (OZEMPIC , 0.25 OR 0.5 MG/DOSE,) 2 MG/3ML SOPN Inject 0.5 mg into the skin once a week.   Unknown   vitamin B-12 (CYANOCOBALAMIN ) 1000 MCG tablet Take 1 tablet (1,000 mcg total) by mouth daily. 30 tablet 0 Unknown   Social History   Socioeconomic History   Marital status: Married    Spouse name: Trasean Delima   Number of children: 2   Years of education: 12   Highest education level: 12th grade  Occupational History   Occupation: Airport  Tobacco Use   Smoking status: Former    Types: Cigarettes   Smokeless tobacco: Never  Vaping Use   Vaping status: Never Used  Substance and Sexual Activity   Alcohol use: No   Drug use: No   Sexual activity: Not Currently  Other Topics Concern   Not on file  Social History Narrative   Not on file   Social Drivers of Health   Financial Resource Strain: Low Risk  (10/29/2019)   Overall Financial Resource Strain (CARDIA)    Difficulty of Paying  Living Expenses: Not very hard  Recent Concern: Financial Resource Strain - Medium Risk (09/08/2019)   Overall Financial Resource Strain (CARDIA)    Difficulty of Paying Living Expenses: Somewhat hard  Food Insecurity: No Food Insecurity (06/23/2023)   Hunger Vital Sign    Worried About Running Out of Food in the Last Year: Never true    Ran Out of Food in the Last Year: Never true  Transportation Needs: No Transportation Needs (06/23/2023)   PRAPARE - Administrator, Civil Service (Medical): No    Lack of Transportation (Non-Medical): No  Physical Activity: Inactive (10/29/2019)   Exercise Vital Sign    Days of Exercise per Week: 0 days    Minutes of Exercise per Session: 0 min  Stress: No Stress Concern Present (10/29/2019)   [employer] of Occupational Health - Occupational Stress Questionnaire    Feeling of Stress : Only a little  Recent Concern: Stress - Stress Concern Present (09/08/2019)   [employer] of Occupational Health - Occupational Stress Questionnaire    Feeling of Stress : To some extent  Social Connections: Moderately Integrated (06/23/2023)   Social Connection and Isolation Panel [NHANES]    Frequency of Communication with Friends and Family: More than three times a week    Frequency of Social Gatherings with Friends and Family: More than three times a week    Attends Religious Services: More than 4 times per year    Active Member of [employer] or Organizations: No    Attends Banker Meetings: Never    Marital Status: Married  Catering manager Violence: Not At Risk (06/23/2023)   Humiliation, Afraid, Rape, and Kick questionnaire    Fear of Current or Ex-Partner: No    Emotionally Abused: No    Physically Abused: No    Sexually Abused: No    Family History  Problem Relation Age of Onset   Colon cancer Mother    Diabetes Other    High blood pressure Other    Prostate cancer Brother    Diabetes Brother      Vitals:   06/26/23 0740  06/26/23 0800 06/26/23 0900 06/26/23 1000  BP: 127/72 131/76 122/74 (!) 133/92  Pulse: 77 73    Resp: 20 17 (!) 24 (!) 24  Temp: 97.6 F (36.4 C)     TempSrc: Oral     SpO2: 99% 95% 99% 99%  Weight:      Height:        PHYSICAL EXAM General: well appearing elderly male, well nourished, in no acute distress. HEENT: Normocephalic and atraumatic. Neck: No JVD.   Lungs: Normal respiratory effort on room air. Clear bilaterally to auscultation. No wheezes, crackles, rhonchi.  Heart: HRRR (intermittent paced). Normal S1 and S2 without gallops or murmurs.  Abdomen: Non-distended appearing.  Msk: Normal strength and tone for age. Chest wall tenderness on palpation. Right groin incision site is clean and dry with no evidence of significant swelling, bruising, or active bleeding. Extremities: Warm and well perfused. No clubbing, cyanosis. No edema.  Neuro: Alert and oriented X 3. Psych: Answers questions appropriately.   Labs: Basic Metabolic Panel: Recent Labs    06/25/23 0355 06/26/23 0405  NA 138 136  K 3.8 4.6  CL 105 104  CO2 27 26  GLUCOSE 131* 135*  BUN 22 24*  CREATININE 1.69* 1.76*  CALCIUM  9.1 8.9  MG 2.0 2.4   Liver Function Tests: Recent Labs    06/23/23 1857 06/24/23 0329  AST 19 26  ALT 11 16  ALKPHOS 107 106  BILITOT 0.6 0.5  PROT 6.4* 6.7  ALBUMIN  3.0* 3.2*   No results for input(s): "LIPASE", "AMYLASE" in the last 72 hours. CBC: Recent Labs    06/23/23 1857 06/24/23 0329 06/25/23 0355 06/26/23 0405  WBC 5.4   < > 5.5 5.6  NEUTROABS 2.4  --   --   --   HGB 14.0   < > 14.3 14.4  HCT 43.2   < > 43.6 44.5  MCV 96.0   < > 94.4 94.9  PLT 166   < > 152 131*   < > = values in this interval not displayed.   Cardiac Enzymes: Recent Labs    06/24/23 0329 06/24/23 0827 06/24/23 1012  TROPONINIHS 1,144* 1,158* 927*   BNP: Recent Labs    06/23/23 1857  BNP 495.0*   D-Dimer: No results for input(s): "DDIMER"  in the last 72 hours. Hemoglobin  A1C: No results for input(s): "HGBA1C" in the last 72 hours. Fasting Lipid Panel: No results for input(s): "CHOL", "HDL", "LDLCALC", "TRIG", "CHOLHDL", "LDLDIRECT" in the last 72 hours. Thyroid  Function Tests: Recent Labs    06/24/23 0827  TSH 1.983   Anemia Panel: No results for input(s): "VITAMINB12", "FOLATE", "FERRITIN", "TIBC", "IRON", "RETICCTPCT" in the last 72 hours.   Radiology: CARDIAC CATHETERIZATION Result Date: 06/24/2023   Ost LAD to Prox LAD lesion is 40% stenosed.   There is mild left ventricular systolic dysfunction.   The left ventricular ejection fraction is 35-45% by visual estimate. 1.  Insignificant coronary artery disease with 40-50% ostial/proximal LAD 2.  Mild reduced left ventricular function with estimate LVEF 40% Recommendations 1.  Medical therapy 2.  Aggressive risk factor modification   ECHOCARDIOGRAM COMPLETE Result Date: 06/24/2023    ECHOCARDIOGRAM REPORT   Patient Name:   [name] Date of Exam: 06/24/2023 Medical Rec #:  [id number]         Height:       72.0 in Accession #:    [id number]        Weight:       234.6 lb Date of Birth:  29-May-1933          BSA:          2.280 m Patient Age:    89 years          BP:           136/117 mmHg Patient Gender: M                 HR:           70 bpm. Exam Location:  [facility] Procedure: 2D Echo, Cardiac Doppler and Color Doppler (Both Spectral and Color            Flow Doppler were utilized during procedure). Indications:     Ventricular tachycardia  History:         Patient has prior history of Echocardiogram examinations, most                  recent 07/09/2022. CHF, Stroke, Arrythmias:Atrial Fibrillation;                  Risk Factors:Sleep Apnea.  Sonographer:     Broadus Canes Referring Phys:  [id number] Avi Body Diagnosing Phys: Lida Reeks Alluri IMPRESSIONS  1. Technically difficult study.  2. Left ventricular ejection fraction, by estimation, is 45 to 50%. The left ventricle has mildly decreased function. The left ventricle  demonstrates regional wall motion abnormalities (see scoring diagram/findings for description). There is moderate left ventricular hypertrophy. Left ventricular diastolic parameters are indeterminate.  3. Right ventricular systolic function was not well visualized. The right ventricular size is not well visualized.  4. Left atrial size was moderately dilated.  5. The mitral valve is normal in structure. Trivial mitral valve regurgitation.  6. The aortic valve is tricuspid. Aortic valve regurgitation is not visualized. FINDINGS  Left Ventricle: Left ventricular ejection fraction, by estimation, is 45 to 50%. The left ventricle has mildly decreased function. The left ventricle demonstrates regional wall motion abnormalities. The left ventricular internal cavity size was normal in size. There is moderate left ventricular hypertrophy. Left ventricular diastolic parameters are indeterminate.  LV Wall Scoring: The antero-lateral wall, posterior wall, and basal inferior segment are hypokinetic. The entire anterior wall, entire septum, entire apex, and mid and distal inferior wall are normal.  Right Ventricle: The right ventricular size is not well visualized. Right vetricular wall thickness was not well visualized. Right ventricular systolic function was not well visualized. Left Atrium: Left atrial size was moderately dilated. Right Atrium: Right atrial size was not well visualized. Pericardium: There is no evidence of pericardial effusion. Mitral Valve: The mitral valve is normal in structure. Trivial mitral valve regurgitation. Tricuspid Valve: The tricuspid valve is not well visualized. Tricuspid valve regurgitation is not demonstrated. Aortic Valve: The aortic valve is tricuspid. Aortic valve regurgitation is not visualized. Aortic valve mean gradient measures 1.5 mmHg. Aortic valve peak gradient measures 2.2 mmHg. Aortic valve area, by VTI measures 2.85 cm. Pulmonic Valve: The pulmonic valve was not well visualized.  Pulmonic valve regurgitation is not visualized. Aorta: The aortic root is normal in size and structure. IAS/Shunts: The interatrial septum was not well visualized.  LEFT VENTRICLE PLAX 2D LVIDd:         4.20 cm     Diastology LVIDs:         3.60 cm     LV e' medial:    6.04 cm/s LV PW:         1.30 cm     LV E/e' medial:  13.8 LV IVS:        1.40 cm     LV e' lateral:   9.25 cm/s LVOT diam:     2.20 cm     LV E/e' lateral: 9.0 LV SV:         34 LV SV Index:   15 LVOT Area:     3.80 cm  LV Volumes (MOD) LV vol d, MOD A2C: 73.2 ml LV vol d, MOD A4C: 78.5 ml LV vol s, MOD A2C: 36.4 ml LV vol s, MOD A4C: 38.0 ml LV SV MOD A2C:     36.8 ml LV SV MOD A4C:     78.5 ml LV SV MOD BP:      38.5 ml RIGHT VENTRICLE RV Basal diam:  3.90 cm RV Mid diam:    3.50 cm LEFT ATRIUM              Index        RIGHT ATRIUM           Index LA diam:        6.40 cm  2.81 cm/m   RA Area:     28.40 cm LA Vol (A2C):   117.0 ml 51.32 ml/m  RA Volume:   85.80 ml  37.63 ml/m LA Vol (A4C):   69.5 ml  30.48 ml/m LA Biplane Vol: 94.8 ml  41.58 ml/m  AORTIC VALVE AV Area (Vmax):    2.24 cm AV Area (Vmean):   2.20 cm AV Area (VTI):     2.85 cm AV Vmax:           74.60 cm/s AV Vmean:          48.650 cm/s AV VTI:            0.118 m AV Peak Grad:      2.2 mmHg AV Mean Grad:      1.5 mmHg LVOT Vmax:         43.90 cm/s LVOT Vmean:        28.200 cm/s LVOT VTI:          0.089 m LVOT/AV VTI ratio: 0.75  AORTA Ao Root diam: 3.70 cm MITRAL VALVE  TRICUSPID VALVE MV Area (PHT): 5.34 cm    TR Peak grad:   9.5 mmHg MV Decel Time: 142 msec    TR Vmax:        154.00 cm/s MV E velocity: 83.30 cm/s                            SHUNTS                            Systemic VTI:  0.09 m                            Systemic Diam: 2.20 cm Joetta Mustache Electronically signed by Joetta Mustache Signature Date/Time: 06/24/2023/11:49:00 AM    Final    DG Chest Portable 1 View Result Date: 06/23/2023 CLINICAL DATA:  vtach, cardioverted EXAM: PORTABLE CHEST 1  VIEW COMPARISON:  Chest radiograph 09/21/2022 FINDINGS: Lung volumes remain low. Cardiomegaly is stable. Mediastinal contours are unchanged. Mild vascular congestion. No focal airspace disease. No pneumothorax or large pleural effusion. On limited assessment, no acute osseous findings. IMPRESSION: Low lung volumes with cardiomegaly and mild vascular congestion. Electronically Signed   By: Chadwick Colonel M.D.   On: 06/23/2023 20:04    ECHO as above  TELEMETRY reviewed by me 06/26/2023: intermittent VP with rate 70s with PVCs  EKG reviewed by me: monomorphic wide complex ventricular tachycardia, rate 181 bpm.  Data reviewed by me 06/26/2023: last 24h vitals tele labs imaging I/O hospitalist progress note, EP notes  Principal Problem:   Monomorphic ventricular tachycardia (HCC) Active Problems:   CKD (chronic kidney disease), stage IV (HCC)   Diabetes mellitus (HCC)   OSA (obstructive sleep apnea)   Acute on chronic heart failure with preserved ejection fraction (HFpEF) (HCC)   NSTEMI (non-ST elevated myocardial infarction) (HCC)   COPD (chronic obstructive pulmonary disease) (HCC)   Chronic a-fib (HCC)   Prostate cancer (HCC)   Hypothermia    ASSESSMENT AND PLAN:  [name] is a 89 y.o. male  with a past medical history of permanent atrial fibrillation (on edoxaban ), chronic HFpEF, hypertension, hyperlipidemia, OSA, CKD stage 3b, infrarenal AAA, COPD, SSS s/p micra, T2DM, stage Ivb metastatic prostate cancer who presented to the ED on 06/23/2023 for altered mental status. On arrival to ED patient was hypotensive with HR in 180s and EKG revealed monomorphic ventricular tachycardia requiring cardioversion. Troponins elevated. Cardiology was consulted for further evaluation.   # Monomorphic Ventricular Tachycardia s/p cardioversion  # NSTEMI # Acute on chronic HFpEF (newly mildly reduced 45-50%) # Permanent Atrial fibrillation # Hypertension # Hyperlipidemia Patient reports with AMS  and found to be in monomorphic VT with HR in 180s in ED. Patient underwent cardioversion. EKG in ED s/p cardioversion at 2211 with ventricular paced rhythm, rate 80s. BNP 490. Trops elevated and trending 16 > 170 > 920 > 1140>1150>920 . Echo this admission with newly mildly reduced EF (45-50%) with anterolateral, posterior wall  and basal inferior segment are hypokinetic, mod LVH (new abnormalities compared to prior echo). Echo from 06/2023 with pEF (55-60%) and no RWMA. Underwent LHC with Dr. Parks Bollman (06/03) and revealed 40% stenosed Ost/prox LAD, mild LV systolic dysfunction. Right groin incision site is clean and dry with no evidence of significant swelling, bruising, or active bleeding. Hgb stable and Cr improved this morning after catheterization. EKG on 06/04 with left fascicular  VT. There is significant improvement in NSVT. -Continue ASA 81 mg, atorvastatin  10 mg daily -Monitor and replenish electrolytes for a goal K >4, Mag >2  -Continue home edoxaban  30 mg daily for stroke risk reduction. -Discontinue metoprolol  tartarte per EP to allow uptitration of verampil.  -Transition IV amio gtt to PO amio 400 BID for 5 days, 400 mg daily for 5 days, then 200 mg daily.  -Continue verampil 40 mg TID, Per EP. Consider uptitrating as BP allows. -Consider resuming home GDMT dapagliflozin  prior to discharge if renal function allows.  -EP consulted. Appreciate recommendations.   -Recommend palliative consult.   This patient's plan of care was discussed and created with Dr. Bob Burn and he is in agreement.  Signed: Creighton Doffing, PA-C  06/26/2023, 10:05 AM [facility]

## 2023-06-26 NOTE — Progress Notes (Signed)
 Heart Failure Navigator Progress Note  Assessed for Heart & Vascular TOC clinic readiness.  Patient does not meet criteria due to [facility].   Navigator will sign off at this time.  Celedonio Coil, RN, BSN [facility] Heart Failure Navigator Secure Chat Only

## 2023-06-26 NOTE — Progress Notes (Signed)
  Patient Name: [name] Date of Encounter: 06/26/2023  Primary Cardiologist: Constancia Delton, MD Electrophysiologist: Boyce Byes, MD  Interval Summary   NAEON, having some inspiratory chest discomfort this AM  No pain at R groin site  Vital Signs    Vitals:   06/26/23 0611 06/26/23 0740 06/26/23 0800 06/26/23 0900  BP: 137/82 127/72 131/76 122/74  Pulse:  77 73   Resp:  20 17 (!) 24  Temp:  97.6 F (36.4 C)    TempSrc:  Oral    SpO2:  99% 95%   Weight:      Height:        Intake/Output Summary (Last 24 hours) at 06/26/2023 0926 Last data filed at 06/26/2023 0800 Gross per 24 hour  Intake 409.83 ml  Output 1700 ml  Net -1290.17 ml   Filed Weights   06/24/23 1205 06/25/23 0543 06/26/23 0500  Weight: 106.4 kg 106 kg 106.8 kg    Physical Exam    GEN- The patient is well appearing, alert and oriented x 3 today. Appears stated age Lungs- CTA, normal work of breathing Cardiac- Regular rate and rhythm, no murmurs, rubs or gallops GI- soft, NT, ND, + BS Extremities- no clubbing or cyanosis. No edema  Telemetry    VP with PVCs, rare 2-3 bt NSVT   (personally reviewed)  Hospital Course    ISA KOHLENBERG is a 89 y.o. male with a history of perm AFib, SND s/p leadless PPM, HFpEF, CKD 3b, HTN, T2DM, metastatic prostate cancer, HLD, COPD, OSA admitted for MM VT.   TTE 6/4 - LVEF reduced to 45-50% (previously normal)  LHC 6/4 -  1.  Insignificant coronary artery disease with 40-50% ostial/proximal LAD. 2.  Mild reduced left ventricular function with estimate LVEF 40%   Assessment & Plan    #) MM VT Appears left anterior fascicular VT by EKG TTE with mildly reduced LVEF, LHC without significant CAD Significant improvement in NSVT  Stop BB Continue 40mg  verapamil  TID, consider increasing as BP allows Transition to oral amio - 400mg  BID x 5 days, then 400mg  daily x 5 days, then 200mg  daily   #) perm AFib Continue home edoxaban     #) CKD #)  T2DM #) metastatic prostate cancer #) chest discomfort Mgmt per primary team and/or gen cards      For questions or updates, please contact [facility] Please consult [email or web address] for contact info under     Signed, Lord Lancour, NP  06/26/2023, 9:26 AM

## 2023-06-26 NOTE — Progress Notes (Signed)
 PROGRESS NOTE    [name]  ZOX:[id number] DOB: 06-05-1933 DOA: 06/23/2023 PCP: Laurence Pons, NP  IC10A/IC10A-AA  LOS: 3 days   Brief hospital course:   Assessment & Plan: [name] is a 89 y.o. male with medical history significant of permanent atrial fibrillation on edoxaban , sick sinus syndrome s/p PPM, HFpEF with last EF of 55-60%, CKD stage IIIb, hypertension, hyperlipidemia, type 2 diabetes, stage IVb metastatic prostate cancer on Xtandi  and leuprolide , DVT who presents to the ED due to altered mental status.  Upon arriving the hospital, patient was hypotensive of 70/60 with heart rate 180.  EKG showed monomorphic ventricular tachycardia which was converted emergently in the emergency room.  Peak troponin elevated to 1100. Patient was placed on heparin  drip, aspirin , Lipitor.  Seen by cardiology, scheduled for heart cath 6/4.   * Monomorphic ventricular tachycardia  Patient reports with AMS and found to be in monomorphic VT with HR in 180s in ED. Patient underwent cardioversion. EKG in ED s/p cardioversion.  Started on amio gtt. --transition to oral amio --d/c lopressor  --increase verampil 40 mg to TID  NSTEMI, ruled out Trop elevation 2/2 demand ischemia --LHC on 6/3 showed insignificant CAD  Chest pain, atypical --multiple episodes of chest pain today, reported to be positional, did not seem cardiac. --tylenol  PRN, IV Toradol  PRN  Acute on chronic combined systolic and diastolic congestive heart failure. --current Echo with LVEF 45-50%  --GDMT per cardio  Acute metabolic encephalopathy secondary to ventricular tachycardia Hypotension secondary to ventricular tachycardia --both resolved  Perm Afib --cont home edoxaban    Hypothermia Patient was noted to be hypothermic on presentation, with improvement in temperature post cardioversion.  Cortisol level elevated.   Prostate cancer [facility]) Patient has a history of stage IVb prostate cancer with  regional lymphadenopathy, on Xtandi  and leuprolide . - Continue home regimen   COPD (chronic obstructive pulmonary disease) (HCC) Chronic hypoxemic respiratory failure on 2L O2 No exacerbation.   OSA (obstructive sleep apnea) - cont CPAP nightly   Diabetes mellitus (HCC) - Hold home Ozempic  and Farxiga  --d/c'ed BG checks, no need   CKD 3b Not CKD 4 --Cr stable at baseline    DVT prophylaxis: On:edoxaban  Code Status: Full code  Family Communication:  Level of care: Stepdown Dispo:   The patient is from: home Anticipated d/c is to: to be determined Anticipated d/c date is: 1-2 days   Subjective and Interval History:  Pt complained of chest pain this morning, centrally located and positional.     Objective: Vitals:   06/26/23 1300 06/26/23 1330 06/26/23 1400 06/26/23 1500  BP: 119/70 132/65 126/74 127/71  Pulse: 80 76 82 72  Resp: 19 (!) 30 (!) 21 (!) 27  Temp:      TempSrc:      SpO2: 99% 98% 98% 96%  Weight:      Height:        Intake/Output Summary (Last 24 hours) at 06/26/2023 1719 Last data filed at 06/26/2023 1300 Gross per 24 hour  Intake 491.24 ml  Output 1050 ml  Net -558.76 ml   Filed Weights   06/24/23 1205 06/25/23 0543 06/26/23 0500  Weight: 106.4 kg 106 kg 106.8 kg    Examination:   Constitutional: NAD, alert, oriented to person and place HEENT: conjunctivae and lids normal, EOMI CV: No cyanosis.   RESP: normal respiratory effort Extremities: edema in BLE SKIN: warm, dry Neuro: II - XII grossly intact.     Data Reviewed: I have personally reviewed  labs and imaging studies  Time spent: 50 minutes  Garrison Kanner, MD [facility] If 7PM-7AM, please contact night-coverage 06/26/2023, 5:19 PM

## 2023-06-27 ENCOUNTER — Other Ambulatory Visit: Payer: Self-pay

## 2023-06-27 ENCOUNTER — Encounter: Payer: Self-pay | Admitting: Oncology

## 2023-06-27 DIAGNOSIS — I472 Ventricular tachycardia, unspecified: Secondary | ICD-10-CM | POA: Diagnosis not present

## 2023-06-27 DIAGNOSIS — I4729 Other ventricular tachycardia: Secondary | ICD-10-CM | POA: Diagnosis not present

## 2023-06-27 DIAGNOSIS — I70222 Atherosclerosis of native arteries of extremities with rest pain, left leg: Secondary | ICD-10-CM | POA: Diagnosis not present

## 2023-06-27 DIAGNOSIS — R4182 Altered mental status, unspecified: Secondary | ICD-10-CM | POA: Diagnosis not present

## 2023-06-27 LAB — BASIC METABOLIC PANEL WITH GFR
Anion gap: 7 (ref 5–15)
BUN: 30 mg/dL — ABNORMAL HIGH (ref 8–23)
CO2: 23 mmol/L (ref 22–32)
Calcium: 8.7 mg/dL — ABNORMAL LOW (ref 8.9–10.3)
Chloride: 104 mmol/L (ref 98–111)
Creatinine, Ser: 1.68 mg/dL — ABNORMAL HIGH (ref 0.61–1.24)
GFR, Estimated: 39 mL/min — ABNORMAL LOW (ref >60)
Glucose, Bld: 144 mg/dL — ABNORMAL HIGH (ref 70–99)
Potassium: 4.4 mmol/L (ref 3.5–5.1)
Sodium: 134 mmol/L — ABNORMAL LOW (ref 135–145)

## 2023-06-27 LAB — GLUCOSE, CAPILLARY: Glucose-Capillary: 194 mg/dL — ABNORMAL HIGH (ref 70–99)

## 2023-06-27 MED ORDER — VERAPAMIL HCL ER 180 MG PO TBCR
180.0000 mg | EXTENDED_RELEASE_TABLET | Freq: Every day | ORAL | Status: DC
  Administered 2023-06-27: 180 mg via ORAL
  Filled 2023-06-27 (×2): qty 1

## 2023-06-27 MED ORDER — ASPIRIN 81 MG PO TBEC
81.0000 mg | DELAYED_RELEASE_TABLET | Freq: Every day | ORAL | 12 refills | Status: DC
  Filled 2023-06-27: qty 30, 30d supply, fill #0

## 2023-06-27 MED ORDER — VERAPAMIL HCL 40 MG PO TABS
80.0000 mg | ORAL_TABLET | Freq: Three times a day (TID) | ORAL | Status: DC
  Filled 2023-06-27: qty 2

## 2023-06-27 MED ORDER — VERAPAMIL HCL ER 180 MG PO TBCR
180.0000 mg | EXTENDED_RELEASE_TABLET | Freq: Every day | ORAL | 0 refills | Status: DC
  Filled 2023-06-27: qty 30, 30d supply, fill #0

## 2023-06-27 MED ORDER — AMIODARONE HCL 200 MG PO TABS
ORAL_TABLET | ORAL | 0 refills | Status: DC
Start: 2023-06-27 — End: 2023-07-11
  Filled 2023-06-27: qty 35, 15d supply, fill #0

## 2023-06-27 MED ORDER — POLYETHYLENE GLYCOL 3350 17 GM/SCOOP PO POWD
17.0000 g | Freq: Every day | ORAL | 0 refills | Status: DC | PRN
  Filled 2023-06-27: qty 238, 14d supply, fill #0

## 2023-06-27 MED ORDER — NITROGLYCERIN 0.4 MG SL SUBL
0.4000 mg | SUBLINGUAL_TABLET | SUBLINGUAL | 0 refills | Status: AC | PRN
  Filled 2023-06-27: qty 25, 8d supply, fill #0

## 2023-06-27 MED ORDER — VERAPAMIL HCL 80 MG PO TABS
80.0000 mg | ORAL_TABLET | Freq: Three times a day (TID) | ORAL | Status: DC
  Filled 2023-06-27: qty 1

## 2023-06-27 NOTE — Progress Notes (Addendum)
 [facility] CARDIOLOGY PROGRESS NOTE       Patient ID: JUVENAL UMAR MRN: [id number] DOB/AGE: Jun 11, 1933 89 y.o.  Admit date: 06/23/2023 Referring Physician Dr. Avi Body Primary Physician Laurence Pons, NP Primary Cardiologist Dr. Beau Bound Reason for Consultation monomorphic VT, elevated trops  HPI: ERCEL PEPITONE is a 89 y.o. male  with a past medical history of permanent atrial fibrillation (on edoxaban ), chronic HFpEF, hypertension, hyperlipidemia, OSA, CKD stage 3b, infrarenal AAA, COPD, SSS s/p micra, T2DM, stage Ivb metastatic prostate cancer who presented to the ED on 06/23/2023 for altered mental status. On arrival to ED patient was hypotensive with HR in 180s and EKG revealed monomorphic ventricular tachycardia requiring cardioversion. Troponins elevated. Cardiology was consulted for further evaluation.   Interval History: -Patient seen and examined this AM and laying comfortably in hospital bed. Patient denies any recurrence of chest pain and denies SOB, or palpitations. -Patients BP improved and HR stable this AM. There is significant improvement in NSVT. EP following. -Patient remains on room air with stable SpO2.  -Encouraged patient to get out of bed.   Review of systems complete and found to be negative unless listed above    Past Medical History:  Diagnosis Date   Acute deep vein thrombosis (DVT) of femoral vein of left lower extremity (HCC)    Atrial fibrillation (HCC)    CHF (congestive heart failure) (HCC)    Chronic kidney disease    Diabetes (HCC)    Hearing loss    High blood pressure    History of bladder problems    Hyperlipidemia    Kidney stones 11/22/2014   Liver cyst    OSA (obstructive sleep apnea)    Prostate cancer (HCC)    Prostate cancer (HCC)    Stroke (HCC)    Valvular heart disease     Past Surgical History:  Procedure Laterality Date   CATARACT EXTRACTION     COLONOSCOPY WITH PROPOFOL  N/A 07/02/2019   Procedure:  COLONOSCOPY WITH PROPOFOL ;  Surgeon: Selena Daily, MD;  Location: [facility] ENDOSCOPY;  Service: Gastroenterology;  Laterality: N/A;   ESOPHAGOGASTRODUODENOSCOPY (EGD) WITH PROPOFOL  N/A 03/19/2022   Procedure: ESOPHAGOGASTRODUODENOSCOPY (EGD) WITH PROPOFOL ;  Surgeon: Luke Salaam, MD;  Location: [facility] ENDOSCOPY;  Service: Gastroenterology;  Laterality: N/A;   IR CT HEAD LTD  08/27/2019   IR PERCUTANEOUS ART THROMBECTOMY/INFUSION INTRACRANIAL INC DIAG ANGIO  08/27/2019       IR PERCUTANEOUS ART THROMBECTOMY/INFUSION INTRACRANIAL INC DIAG ANGIO  08/27/2019   LEFT HEART CATH AND CORONARY ANGIOGRAPHY N/A 06/24/2023   Procedure: LEFT HEART CATH AND CORONARY ANGIOGRAPHY;  Surgeon: Percival Brace, MD;  Location: [facility] INVASIVE CV LAB;  Service: Cardiovascular;  Laterality: N/A;   LOWER EXTREMITY ANGIOGRAPHY Left 12/25/2020   Procedure: Lower Extremity Angiography;  Surgeon: Celso College, MD;  Location: [facility] INVASIVE CV LAB;  Service: Cardiovascular;  Laterality: Left;   PACEMAKER LEADLESS INSERTION N/A 12/19/2020   Procedure: PACEMAKER LEADLESS INSERTION;  Surgeon: Percival Brace, MD;  Location: [facility] INVASIVE CV LAB;  Service: Cardiovascular;  Laterality: N/A;   PROSTATE CANCER     PROSTATE SURGERY     RADIOLOGY WITH ANESTHESIA N/A 08/27/2019   Procedure: IR WITH ANESTHESIA;  Surgeon: Luellen Sages, MD;  Location: [facility] OR;  Service: Radiology;  Laterality: N/A;    Medications Prior to Admission  Medication Sig Dispense Refill Last Dose/Taking   albuterol  (VENTOLIN  HFA) 108 (90 Base) MCG/ACT inhaler Inhale 2 puffs into the lungs every 4 (four) hours as needed for wheezing  or shortness of breath. 18 g 5 Taking As Needed   atorvastatin  (LIPITOR) 10 MG tablet TAKE 1 TABLET(10 MG) BY MOUTH DAILY 90 tablet 1 Taking   dapagliflozin  propanediol (FARXIGA ) 10 MG TABS tablet Take 1 tablet (10 mg total) by mouth daily. 30 tablet 5 Taking   edoxaban  (SAVAYSA ) 30 MG TABS tablet Take 30 mg by mouth daily.    Taking   enzalutamide  (XTANDI ) 40 MG tablet Take 4 tablets (160 mg total) by mouth daily. 120 tablet 2 Taking   furosemide  (LASIX ) 20 MG tablet Take 1 tablet (20 mg total) by mouth daily. 90 tablet 3 Taking   Metoprolol  Tartrate 37.5 MG TABS Take 1 tablet (37.5 mg total) by mouth 2 (two) times daily. 60 tablet 11 Taking   pantoprazole  (PROTONIX ) 40 MG tablet Take 1 tablet (40 mg total) by mouth daily. 90 tablet 3 Taking   Tiotropium Bromide  Monohydrate (SPIRIVA  RESPIMAT) 2.5 MCG/ACT AERS Inhale 2 puffs into the lungs daily. 4 g 3 Taking   Cholecalciferol (VITAMIN D-3 PO) Take 1 capsule by mouth daily.   Unknown   ferrous sulfate  (FEROSUL) 325 (65 FE) MG tablet Take 1 tablet by mouth daily.   Unknown   glucose blood (ONETOUCH VERIO) test strip USE ONE STRIP DAILY 100 strip 3    Semaglutide ,0.25 or 0.5MG /DOS, (OZEMPIC , 0.25 OR 0.5 MG/DOSE,) 2 MG/3ML SOPN Inject 0.5 mg into the skin once a week.   Unknown   vitamin B-12 (CYANOCOBALAMIN ) 1000 MCG tablet Take 1 tablet (1,000 mcg total) by mouth daily. 30 tablet 0 Unknown   Social History   Socioeconomic History   Marital status: Married    Spouse name: Skanda Worlds   Number of children: 2   Years of education: 12   Highest education level: 12th grade  Occupational History   Occupation: [employer]  Tobacco Use   Smoking status: Former    Types: Cigarettes   Smokeless tobacco: Never  Vaping Use   Vaping status: Never Used  Substance and Sexual Activity   Alcohol use: No   Drug use: No   Sexual activity: Not Currently  Other Topics Concern   Not on file  Social History Narrative   Not on file   Social Drivers of Health   Financial Resource Strain: Low Risk  (10/29/2019)   Overall Financial Resource Strain (CARDIA)    Difficulty of Paying Living Expenses: Not very hard  Recent Concern: Financial Resource Strain - Medium Risk (09/08/2019)   Overall Financial Resource Strain (CARDIA)    Difficulty of Paying Living Expenses: Somewhat  hard  Food Insecurity: No Food Insecurity (06/23/2023)   Hunger Vital Sign    Worried About Running Out of Food in the Last Year: Never true    Ran Out of Food in the Last Year: Never true  Transportation Needs: No Transportation Needs (06/23/2023)   PRAPARE - Administrator, Civil Service (Medical): No    Lack of Transportation (Non-Medical): No  Physical Activity: Inactive (10/29/2019)   Exercise Vital Sign    Days of Exercise per Week: 0 days    Minutes of Exercise per Session: 0 min  Stress: No Stress Concern Present (10/29/2019)   [employer] of Occupational Health - Occupational Stress Questionnaire    Feeling of Stress : Only a little  Recent Concern: Stress - Stress Concern Present (09/08/2019)   [employer] of Occupational Health - Occupational Stress Questionnaire    Feeling of Stress : To some extent  Social Connections:  Moderately Integrated (06/23/2023)   Social Connection and Isolation Panel [NHANES]    Frequency of Communication with Friends and Family: More than three times a week    Frequency of Social Gatherings with Friends and Family: More than three times a week    Attends Religious Services: More than 4 times per year    Active Member of Clubs or Organizations: No    Attends Banker Meetings: Never    Marital Status: Married  Catering manager Violence: Not At Risk (06/23/2023)   Humiliation, Afraid, Rape, and Kick questionnaire    Fear of Current or Ex-Partner: No    Emotionally Abused: No    Physically Abused: No    Sexually Abused: No    Family History  Problem Relation Age of Onset   Colon cancer Mother    Diabetes Other    High blood pressure Other    Prostate cancer Brother    Diabetes Brother      Vitals:   06/26/23 2230 06/27/23 0437 06/27/23 0515 06/27/23 0817  BP: 120/75 128/78 128/78 118/65  Pulse: 70 77  82  Resp: 18 18    Temp: 98 F (36.7 C) 98.3 F (36.8 C)  98.5 F (36.9 C)  TempSrc: Oral Oral    SpO2:  99% 98%  96%  Weight:  107.3 kg    Height:        PHYSICAL EXAM General: well appearing elderly male, well nourished, in no acute distress. HEENT: Normocephalic and atraumatic. Neck: No JVD.   Lungs: Normal respiratory effort on room air. Clear bilaterally to auscultation. No wheezes, crackles, rhonchi.  Heart: HRRR (intermittent paced). Normal S1 and S2 without gallops or murmurs.  Abdomen: Non-distended appearing.  Msk: Normal strength and tone for age. Chest wall tenderness on palpation. Right groin incision site is clean and dry with no evidence of significant swelling, bruising, or active bleeding. Extremities: Warm and well perfused. No clubbing, cyanosis. No edema.  Neuro: Alert and oriented X 3. Psych: Answers questions appropriately.   Labs: Basic Metabolic Panel: Recent Labs    06/25/23 0355 06/26/23 0405 06/27/23 0620  NA 138 136 134*  K 3.8 4.6 4.4  CL 105 104 104  CO2 27 26 23   GLUCOSE 131* 135* 144*  BUN 22 24* 30*  CREATININE 1.69* 1.76* 1.68*  CALCIUM  9.1 8.9 8.7*  MG 2.0 2.4  --    Liver Function Tests: No results for input(s): "AST", "ALT", "ALKPHOS", "BILITOT", "PROT", "ALBUMIN " in the last 72 hours.  No results for input(s): "LIPASE", "AMYLASE" in the last 72 hours. CBC: Recent Labs    06/25/23 0355 06/26/23 0405  WBC 5.5 5.6  HGB 14.3 14.4  HCT 43.6 44.5  MCV 94.4 94.9  PLT 152 131*   Cardiac Enzymes: Recent Labs    06/24/23 1012  TROPONINIHS 927*   BNP: No results for input(s): "BNP" in the last 72 hours.  D-Dimer: Recent Labs    06/26/23 1304  DDIMER 0.67*   Hemoglobin A1C: No results for input(s): "HGBA1C" in the last 72 hours. Fasting Lipid Panel: No results for input(s): "CHOL", "HDL", "LDLCALC", "TRIG", "CHOLHDL", "LDLDIRECT" in the last 72 hours. Thyroid  Function Tests: No results for input(s): "TSH", "T4TOTAL", "T3FREE", "THYROIDAB" in the last 72 hours.  Invalid input(s): "FREET3"  Anemia Panel: No results for  input(s): "VITAMINB12", "FOLATE", "FERRITIN", "TIBC", "IRON", "RETICCTPCT" in the last 72 hours.   Radiology: CARDIAC CATHETERIZATION Result Date: 06/24/2023   Ost LAD to Prox LAD lesion is  40% stenosed.   There is mild left ventricular systolic dysfunction.   The left ventricular ejection fraction is 35-45% by visual estimate. 1.  Insignificant coronary artery disease with 40-50% ostial/proximal LAD 2.  Mild reduced left ventricular function with estimate LVEF 40% Recommendations 1.  Medical therapy 2.  Aggressive risk factor modification   ECHOCARDIOGRAM COMPLETE Result Date: 06/24/2023    ECHOCARDIOGRAM REPORT   Patient Name:   KHIREE BUKHARI Date of Exam: 06/24/2023 Medical Rec #:  [id number]         Height:       72.0 in Accession #:    [id number]        Weight:       234.6 lb Date of Birth:  16-Apr-1933          BSA:          2.280 m Patient Age:    89 years          BP:           136/117 mmHg Patient Gender: M                 HR:           70 bpm. Exam Location:  [facility] Procedure: 2D Echo, Cardiac Doppler and Color Doppler (Both Spectral and Color            Flow Doppler were utilized during procedure). Indications:     Ventricular tachycardia  History:         Patient has prior history of Echocardiogram examinations, most                  recent 07/09/2022. CHF, Stroke, Arrythmias:Atrial Fibrillation;                  Risk Factors:Sleep Apnea.  Sonographer:     Broadus Canes Referring Phys:  [id number] Avi Body Diagnosing Phys: Lida Reeks Alluri IMPRESSIONS  1. Technically difficult study.  2. Left ventricular ejection fraction, by estimation, is 45 to 50%. The left ventricle has mildly decreased function. The left ventricle demonstrates regional wall motion abnormalities (see scoring diagram/findings for description). There is moderate left ventricular hypertrophy. Left ventricular diastolic parameters are indeterminate.  3. Right ventricular systolic function was not well visualized. The right ventricular size  is not well visualized.  4. Left atrial size was moderately dilated.  5. The mitral valve is normal in structure. Trivial mitral valve regurgitation.  6. The aortic valve is tricuspid. Aortic valve regurgitation is not visualized. FINDINGS  Left Ventricle: Left ventricular ejection fraction, by estimation, is 45 to 50%. The left ventricle has mildly decreased function. The left ventricle demonstrates regional wall motion abnormalities. The left ventricular internal cavity size was normal in size. There is moderate left ventricular hypertrophy. Left ventricular diastolic parameters are indeterminate.  LV Wall Scoring: The antero-lateral wall, posterior wall, and basal inferior segment are hypokinetic. The entire anterior wall, entire septum, entire apex, and mid and distal inferior wall are normal. Right Ventricle: The right ventricular size is not well visualized. Right vetricular wall thickness was not well visualized. Right ventricular systolic function was not well visualized. Left Atrium: Left atrial size was moderately dilated. Right Atrium: Right atrial size was not well visualized. Pericardium: There is no evidence of pericardial effusion. Mitral Valve: The mitral valve is normal in structure. Trivial mitral valve regurgitation. Tricuspid Valve: The tricuspid valve is not well visualized. Tricuspid valve regurgitation is not demonstrated. Aortic Valve: The aortic valve is  tricuspid. Aortic valve regurgitation is not visualized. Aortic valve mean gradient measures 1.5 mmHg. Aortic valve peak gradient measures 2.2 mmHg. Aortic valve area, by VTI measures 2.85 cm. Pulmonic Valve: The pulmonic valve was not well visualized. Pulmonic valve regurgitation is not visualized. Aorta: The aortic root is normal in size and structure. IAS/Shunts: The interatrial septum was not well visualized.  LEFT VENTRICLE PLAX 2D LVIDd:         4.20 cm     Diastology LVIDs:         3.60 cm     LV e' medial:    6.04 cm/s LV PW:          1.30 cm     LV E/e' medial:  13.8 LV IVS:        1.40 cm     LV e' lateral:   9.25 cm/s LVOT diam:     2.20 cm     LV E/e' lateral: 9.0 LV SV:         34 LV SV Index:   15 LVOT Area:     3.80 cm  LV Volumes (MOD) LV vol d, MOD A2C: 73.2 ml LV vol d, MOD A4C: 78.5 ml LV vol s, MOD A2C: 36.4 ml LV vol s, MOD A4C: 38.0 ml LV SV MOD A2C:     36.8 ml LV SV MOD A4C:     78.5 ml LV SV MOD BP:      38.5 ml RIGHT VENTRICLE RV Basal diam:  3.90 cm RV Mid diam:    3.50 cm LEFT ATRIUM              Index        RIGHT ATRIUM           Index LA diam:        6.40 cm  2.81 cm/m   RA Area:     28.40 cm LA Vol (A2C):   117.0 ml 51.32 ml/m  RA Volume:   85.80 ml  37.63 ml/m LA Vol (A4C):   69.5 ml  30.48 ml/m LA Biplane Vol: 94.8 ml  41.58 ml/m  AORTIC VALVE AV Area (Vmax):    2.24 cm AV Area (Vmean):   2.20 cm AV Area (VTI):     2.85 cm AV Vmax:           74.60 cm/s AV Vmean:          48.650 cm/s AV VTI:            0.118 m AV Peak Grad:      2.2 mmHg AV Mean Grad:      1.5 mmHg LVOT Vmax:         43.90 cm/s LVOT Vmean:        28.200 cm/s LVOT VTI:          0.089 m LVOT/AV VTI ratio: 0.75  AORTA Ao Root diam: 3.70 cm MITRAL VALVE               TRICUSPID VALVE MV Area (PHT): 5.34 cm    TR Peak grad:   9.5 mmHg MV Decel Time: 142 msec    TR Vmax:        154.00 cm/s MV E velocity: 83.30 cm/s                            SHUNTS  Systemic VTI:  0.09 m                            Systemic Diam: 2.20 cm Joetta Mustache Electronically signed by Joetta Mustache Signature Date/Time: 06/24/2023/11:49:00 AM    Final    DG Chest Portable 1 View Result Date: 06/23/2023 CLINICAL DATA:  vtach, cardioverted EXAM: PORTABLE CHEST 1 VIEW COMPARISON:  Chest radiograph 09/21/2022 FINDINGS: Lung volumes remain low. Cardiomegaly is stable. Mediastinal contours are unchanged. Mild vascular congestion. No focal airspace disease. No pneumothorax or large pleural effusion. On limited assessment, no acute osseous findings.  IMPRESSION: Low lung volumes with cardiomegaly and mild vascular congestion. Electronically Signed   By: Chadwick Colonel M.D.   On: 06/23/2023 20:04    ECHO as above  TELEMETRY reviewed by me 06/27/2023: intermittent VP with rate 80s with PVCs  EKG reviewed by me: monomorphic wide complex ventricular tachycardia, rate 181 bpm.  Data reviewed by me 06/27/2023: last 24h vitals tele labs imaging I/O hospitalist progress note, EP notes  Principal Problem:   Monomorphic ventricular tachycardia (HCC) Active Problems:   CKD (chronic kidney disease), stage IV (HCC)   Diabetes mellitus (HCC)   OSA (obstructive sleep apnea)   Acute on chronic heart failure with preserved ejection fraction (HFpEF) (HCC)   NSTEMI (non-ST elevated myocardial infarction) (HCC)   COPD (chronic obstructive pulmonary disease) (HCC)   Chronic a-fib (HCC)   Prostate cancer (HCC)   Hypothermia    ASSESSMENT AND PLAN:  DAJOHN ELLENDER is a 89 y.o. male  with a past medical history of permanent atrial fibrillation (on edoxaban ), chronic HFpEF, hypertension, hyperlipidemia, OSA, CKD stage 3b, infrarenal AAA, COPD, SSS s/p micra, T2DM, stage Ivb metastatic prostate cancer who presented to the ED on 06/23/2023 for altered mental status. On arrival to ED patient was hypotensive with HR in 180s and EKG revealed monomorphic ventricular tachycardia requiring cardioversion. Troponins elevated. Cardiology was consulted for further evaluation.   # Monomorphic Ventricular Tachycardia s/p cardioversion  # Nonobstructive Coronary artery disease # Acute on chronic HFpEF (newly mildly reduced 45-50%) # Permanent Atrial fibrillation # Hypertension # Hyperlipidemia Patient reports with AMS and found to be in monomorphic VT with HR in 180s in ED. Patient underwent cardioversion. EKG in ED s/p cardioversion at 2211 with ventricular paced rhythm, rate 80s. BNP 490. Trops elevated and trending 16 > 170 > 920 > 1140>1150>920 . Echo this  admission with newly mildly reduced EF (45-50%) with anterolateral, posterior wall  and basal inferior segment are hypokinetic, mod LVH (new abnormalities compared to prior echo). Echo from 06/2023 with pEF (55-60%) and no RWMA. Underwent LHC with Dr. Parks Bollman (06/03) and revealed 40% stenosed Ost/prox LAD, mild LV systolic dysfunction. Right groin incision site is clean and dry with no evidence of significant swelling, bruising, or active bleeding. Hgb stable and Cr improved this morning after catheterization. EKG on 06/04 with left fascicular VT. There is significant improvement in NSVT. Per tele with occasional PVCs. -Continue ASA 81 mg, atorvastatin  10 mg daily -Monitor and replenish electrolytes for a goal K >4, Mag >2  -Continue home edoxaban  30 mg daily for stroke risk reduction. -Discontinue metoprolol  tartarte per EP to allow uptitration of verampil.  -Continue PO amio 400 BID for 5 days, 400 mg daily for 5 days, then 200 mg daily.  -Consolidated verampil to 180 mg daily. -Consider resuming home GDMT dapagliflozin  at outpatient follow-up if renal function allows. -EP consulted. Appreciate  recommendations.  Recommend outpatient follow-up with EP for possible ablation. -Encouraged patient to get out of bed.  Ok for discharge today from a cardiac perspective. Will arrange for follow up in clinic with Dr. Beau Bound in 1-2 weeks.  Follow-up appointment with Dr. Devore (heart failure) on 06/17 at 11AM.   This patient's plan of care was discussed and created with Dr. Bob Burn and he is in agreement.  Signed: Creighton Doffing, PA-C  06/27/2023, 9:22 AM [facility] Cardiology

## 2023-06-27 NOTE — Care Management Important Message (Signed)
 Important Message  Patient Details  Name: QUY LOTTS MRN: [id number] Date of Birth: 01/04/1934   Important Message Given:  Yes - Medicare IM     Anise Kerns 06/27/2023, 4:19 PM

## 2023-06-27 NOTE — Evaluation (Signed)
 Physical Therapy Evaluation Patient Details Name: [name] MRN: [id number] DOB: 23-Dec-1933 Today's Date: 06/27/2023  History of Present Illness  89 y/o male presented to ED on 06/23/23 for AMS and elevated HR. Admitted for monomorphic ventricular tachycardia s/p cardioversion in ED on 6/2. PMH: permanent Afib, sick sinus syndrome s/p PPM, HFpEF, CKD stage IIIb, HTN, T2DM, stage IVb metastatic prostate cancer, DVT  Clinical Impression  Patient admitted with the above. PTA, patient lives with wife and was independent with cane and working ~32 hours/week. Cognition difficult to assess due to severe HOH. Completed bed mobility with CGA. Sit to stand with CGA, however required modA to recover LOB with and without RW. Ambulated 5' within room to recliner with RW and minA. Poor awareness into deficits and situation. Patient will benefit from skilled PT services during acute stay to address listed deficits. Patient will benefit from ongoing therapy at discharge to maximize functional independence and safety.         If plan is discharge home, recommend the following: A lot of help with walking and/or transfers;A lot of help with bathing/dressing/bathroom;Assistance with cooking/housework;Direct supervision/assist for financial management;Direct supervision/assist for medications management;Assist for transportation;Help with stairs or ramp for entrance;Supervision due to cognitive status   Can travel by private vehicle   Yes    Equipment Recommendations Rolling [name] (2 wheels);BSC/3in1  Recommendations for Other Services       Functional Status Assessment Patient has had a recent decline in their functional status and demonstrates the ability to make significant improvements in function in a reasonable and predictable amount of time.     Precautions / Restrictions Precautions Precautions: Fall Recall of Precautions/Restrictions: Intact Restrictions Weight Bearing Restrictions Per Provider  Order: No      Mobility  Bed Mobility Overal bed mobility: Needs Assistance Bed Mobility: Supine to Sit     Supine to sit: Contact guard          Transfers Overall transfer level: Needs assistance Equipment used: None, Rolling [name] (2 wheels) Transfers: Sit to/from Stand Sit to Stand: Contact guard assist, Mod assist           General transfer comment: Able to stand with CGA, however requires modA upon standing to prevent LOB on both attempts with and without RW. Incontinent of urine in standing    Ambulation/Gait Ambulation/Gait assistance: Min assist Gait Distance (Feet): 5 Feet Assistive device: Rolling [name] (2 wheels) Gait Pattern/deviations: Step-through pattern, Decreased stride length, Trunk flexed Gait velocity: decreased     General Gait Details: trunk flexed throughout, Assist for balance and Rw management to ambulate to chair  Stairs            Wheelchair Mobility     Tilt Bed    Modified Rankin (Stroke Patients Only)       Balance Overall balance assessment: Needs assistance Sitting-balance support: No upper extremity supported, Feet supported Sitting balance-Leahy Scale: Fair     Standing balance support: Bilateral upper extremity supported, Reliant on assistive device for balance Standing balance-Leahy Scale: Poor                               Pertinent Vitals/Pain Pain Assessment Pain Assessment: No/denies pain    Home Living Family/patient expects to be discharged to:: Private residence Living Arrangements: Spouse/significant other Available Help at Discharge: Family Type of Home: House Home Access: Stairs to enter Entrance Stairs-Rails: None Entrance Stairs-Number of Steps: 1-2  Home Layout: One level Home Equipment: Agricultural consultant (2 wheels);Cane - single point      Prior Function Prior Level of Function : Independent/Modified Independent;Driving;Working/employed             Mobility  Comments: uses cane intermittently. Reports supposed to be on 2L O2 at home but often doesn't use it       Extremity/Trunk Assessment   Upper Extremity Assessment Upper Extremity Assessment: Generalized weakness    Lower Extremity Assessment Lower Extremity Assessment: Generalized weakness    Cervical / Trunk Assessment Cervical / Trunk Assessment: Kyphotic  Communication   Communication Communication: Impaired Factors Affecting Communication: Hearing impaired    Cognition Arousal: Alert Behavior During Therapy: WFL for tasks assessed/performed   PT - Cognitive impairments: No family/caregiver present to determine baseline                       PT - Cognition Comments: poor awareness into deficits and safety. A&Ox4 during session. Very HOH Following commands: Impaired Following commands impaired: Follows one step commands with increased time, Follows one step commands inconsistently     Cueing Cueing Techniques: Verbal cues, Tactile cues     General Comments      Exercises     Assessment/Plan    PT Assessment Patient needs continued PT services  PT Problem List Decreased strength;Decreased activity tolerance;Decreased balance;Decreased mobility;Decreased cognition;Decreased knowledge of use of DME;Decreased safety awareness;Decreased knowledge of precautions;Cardiopulmonary status limiting activity       PT Treatment Interventions DME instruction;Gait training;Functional mobility training;Therapeutic activities;Balance training;Therapeutic exercise;Stair training;Neuromuscular re-education;Patient/family education    PT Goals (Current goals can be found in the Care Plan section)  Acute Rehab PT Goals Patient Stated Goal: did not state PT Goal Formulation: With patient Time For Goal Achievement: 07/11/23 Potential to Achieve Goals: Fair    Frequency Min 2X/week     Co-evaluation               AM-PAC PT "6 Clicks" Mobility  Outcome Measure  Help needed turning from your back to your side while in a flat bed without using bedrails?: A Little Help needed moving from lying on your back to sitting on the side of a flat bed without using bedrails?: A Little Help needed moving to and from a bed to a chair (including a wheelchair)?: A Little Help needed standing up from a chair using your arms (e.g., wheelchair or bedside chair)?: A Lot Help needed to walk in hospital room?: A Lot Help needed climbing 3-5 steps with a railing? : A Lot 6 Click Score: 15    End of Session   Activity Tolerance: Patient tolerated treatment well Patient left: in chair;with call bell/phone within reach;with chair alarm set Nurse Communication: Mobility status PT Visit Diagnosis: Unsteadiness on feet (R26.81);Muscle weakness (generalized) (M62.81);Difficulty in walking, not elsewhere classified (R26.2)    Time: 0922-0939 PT Time Calculation (min) (ACUTE ONLY): 17 min   Charges:   PT Evaluation $PT Eval Moderate Complexity: 1 Mod   PT General Charges $$ ACUTE PT VISIT: 1 Visit         Janine Melbourne, PT, DPT Physical Therapist - [facility]  [facility]   Ernestyne Caldwell A Aleia Larocca 06/27/2023, 11:32 AM

## 2023-06-27 NOTE — TOC Transition Note (Addendum)
 Transition of Care [facility]) - Discharge Note   Patient Details  Name: [name] MRN: [id number] Date of Birth: [birth year]  Transition of Care [facility] CM/SW Contact:  Dacie Mandel C Damyan Corne, RN Phone Number: 06/27/2023, 12:30 PM   Clinical Narrative:    Spoke with patient regarding therapy's recommendation for [facility]. He request to speak with his POA, [name].   RNCM spoke with Glenn regarding therapy's HHPT recommendation and DME. She is agreeable to [facility] but not sure how much patient will benefit. "He was walking just a few days ago." Glenn was advised of needed DME and is also agreeable to a RW but not a BSC. She stated patient has one already.   Spoke with Glenn about palliative care as recommended by MD. She stated she was unaware of this recommendation and did not know was palliative care is. RNCM explained the purpose of palliative care. "I don't think  he needs that but you can go ahead." Glenn was advised she can change her mind at any time. She was provided with choices and has chosen [facility].   Referral for [facility] made to [place] from [facility].  Request for RW made to Jon from [employer].   2:00pm [facility] referral sent to and accepted by Verdis Glade from [facility] Per Glenn RW is not not needed.   TOC signing off           Patient Goals and CMS Choice            Discharge Placement                       Discharge Plan and Services Additional resources added to the After Visit Summary for                                       Social Drivers of Health (SDOH) Interventions SDOH Screenings   Food Insecurity: No Food Insecurity (06/23/2023)  Housing: Low Risk  (06/23/2023)  Transportation Needs: No Transportation Needs (06/23/2023)  Utilities: Not At Risk (06/23/2023)  Alcohol Screen: Low Risk  (10/26/2021)  Depression (PHQ2-9): Low Risk  (12/26/2022)  Financial Resource Strain: Low Risk  (10/29/2019)  Recent Concern: Financial Resource Strain - Medium  Risk (09/08/2019)  Physical Activity: Inactive (10/29/2019)  Social Connections: Moderately Integrated (06/23/2023)  Stress: No Stress Concern Present (10/29/2019)  Recent Concern: Stress - Stress Concern Present (09/08/2019)  Tobacco Use: Medium Risk (06/23/2023)     Readmission Risk Interventions    09/11/2022    1:23 PM 12/26/2020   12:08 PM  Readmission Risk Prevention Plan  Transportation Screening Complete Complete  PCP or Specialist Appt within 3-5 Days  Complete  HRI or Home Care Consult  Complete  Social Work Consult for Recovery Care Planning/Counseling  Complete  Palliative Care Screening  Not Applicable  Medication Review [employer]) Referral to Pharmacy   PCP or Specialist appointment within 3-5 days of discharge Complete   HRI or Home Care Consult Complete   SW Recovery Care/Counseling Consult Complete   Palliative Care Screening Not Applicable   Skilled Nursing Facility Not Applicable

## 2023-06-28 ENCOUNTER — Emergency Department

## 2023-06-28 ENCOUNTER — Inpatient Hospital Stay (HOSPITAL_COMMUNITY)

## 2023-06-28 ENCOUNTER — Inpatient Hospital Stay (HOSPITAL_COMMUNITY): Admitting: Anesthesiology

## 2023-06-28 ENCOUNTER — Inpatient Hospital Stay (HOSPITAL_COMMUNITY)
Admission: EM | Admit: 2023-06-28 | Discharge: 2023-07-11 | DRG: 023 | Disposition: A | Discharge status: Skilled Nursing Facility | Source: Other Acute Inpatient Hospital | Attending: Neurology | Admitting: Neurology

## 2023-06-28 ENCOUNTER — Other Ambulatory Visit (HOSPITAL_COMMUNITY): Payer: Self-pay | Admitting: Interventional Radiology

## 2023-06-28 ENCOUNTER — Encounter (HOSPITAL_COMMUNITY)
Admission: EM | Disposition: A | Discharge status: Skilled Nursing Facility | Payer: Self-pay | Source: Other Acute Inpatient Hospital | Attending: Neurology

## 2023-06-28 ENCOUNTER — Emergency Department
Admission: EM | Admit: 2023-06-28 | Discharge: 2023-06-28 | Disposition: A | Discharge status: Home / Self Care | Attending: Emergency Medicine | Admitting: Emergency Medicine

## 2023-06-28 ENCOUNTER — Other Ambulatory Visit: Payer: Self-pay

## 2023-06-28 DIAGNOSIS — Z79899 Other long term (current) drug therapy: Secondary | ICD-10-CM

## 2023-06-28 DIAGNOSIS — G8191 Hemiplegia, unspecified affecting right dominant side: Secondary | ICD-10-CM | POA: Diagnosis present

## 2023-06-28 DIAGNOSIS — Z7982 Long term (current) use of aspirin: Secondary | ICD-10-CM | POA: Diagnosis not present

## 2023-06-28 DIAGNOSIS — I130 Hypertensive heart and chronic kidney disease with heart failure and stage 1 through stage 4 chronic kidney disease, or unspecified chronic kidney disease: Secondary | ICD-10-CM | POA: Diagnosis not present

## 2023-06-28 DIAGNOSIS — I700 Atherosclerosis of aorta: Secondary | ICD-10-CM | POA: Diagnosis not present

## 2023-06-28 DIAGNOSIS — J9601 Acute respiratory failure with hypoxia: Secondary | ICD-10-CM | POA: Diagnosis not present

## 2023-06-28 DIAGNOSIS — I6523 Occlusion and stenosis of bilateral carotid arteries: Secondary | ICD-10-CM | POA: Diagnosis not present

## 2023-06-28 DIAGNOSIS — G4733 Obstructive sleep apnea (adult) (pediatric): Secondary | ICD-10-CM | POA: Diagnosis present

## 2023-06-28 DIAGNOSIS — E1122 Type 2 diabetes mellitus with diabetic chronic kidney disease: Secondary | ICD-10-CM | POA: Diagnosis not present

## 2023-06-28 DIAGNOSIS — I63512 Cerebral infarction due to unspecified occlusion or stenosis of left middle cerebral artery: Secondary | ICD-10-CM | POA: Diagnosis not present

## 2023-06-28 DIAGNOSIS — R29810 Facial weakness: Secondary | ICD-10-CM | POA: Insufficient documentation

## 2023-06-28 DIAGNOSIS — Z86718 Personal history of other venous thrombosis and embolism: Secondary | ICD-10-CM | POA: Diagnosis not present

## 2023-06-28 DIAGNOSIS — Z79818 Long term (current) use of other agents affecting estrogen receptors and estrogen levels: Secondary | ICD-10-CM

## 2023-06-28 DIAGNOSIS — I959 Hypotension, unspecified: Secondary | ICD-10-CM | POA: Diagnosis not present

## 2023-06-28 DIAGNOSIS — G8190 Hemiplegia, unspecified affecting unspecified side: Secondary | ICD-10-CM | POA: Diagnosis not present

## 2023-06-28 DIAGNOSIS — Z87891 Personal history of nicotine dependence: Secondary | ICD-10-CM

## 2023-06-28 DIAGNOSIS — E871 Hypo-osmolality and hyponatremia: Secondary | ICD-10-CM | POA: Diagnosis present

## 2023-06-28 DIAGNOSIS — N3000 Acute cystitis without hematuria: Secondary | ICD-10-CM | POA: Diagnosis not present

## 2023-06-28 DIAGNOSIS — T41295A Adverse effect of other general anesthetics, initial encounter: Secondary | ICD-10-CM | POA: Diagnosis not present

## 2023-06-28 DIAGNOSIS — R310 Gross hematuria: Secondary | ICD-10-CM | POA: Diagnosis not present

## 2023-06-28 DIAGNOSIS — I6782 Cerebral ischemia: Secondary | ICD-10-CM | POA: Diagnosis not present

## 2023-06-28 DIAGNOSIS — I69391 Dysphagia following cerebral infarction: Secondary | ICD-10-CM | POA: Diagnosis not present

## 2023-06-28 DIAGNOSIS — E1165 Type 2 diabetes mellitus with hyperglycemia: Secondary | ICD-10-CM | POA: Diagnosis not present

## 2023-06-28 DIAGNOSIS — Z800 Family history of malignant neoplasm of digestive organs: Secondary | ICD-10-CM

## 2023-06-28 DIAGNOSIS — I69351 Hemiplegia and hemiparesis following cerebral infarction affecting right dominant side: Secondary | ICD-10-CM | POA: Diagnosis not present

## 2023-06-28 DIAGNOSIS — I495 Sick sinus syndrome: Secondary | ICD-10-CM | POA: Diagnosis not present

## 2023-06-28 DIAGNOSIS — R0902 Hypoxemia: Secondary | ICD-10-CM

## 2023-06-28 DIAGNOSIS — R29725 NIHSS score 25: Secondary | ICD-10-CM | POA: Diagnosis not present

## 2023-06-28 DIAGNOSIS — R569 Unspecified convulsions: Secondary | ICD-10-CM | POA: Diagnosis not present

## 2023-06-28 DIAGNOSIS — I5022 Chronic systolic (congestive) heart failure: Secondary | ICD-10-CM | POA: Diagnosis not present

## 2023-06-28 DIAGNOSIS — N3001 Acute cystitis with hematuria: Secondary | ICD-10-CM | POA: Diagnosis not present

## 2023-06-28 DIAGNOSIS — N1832 Chronic kidney disease, stage 3b: Secondary | ICD-10-CM | POA: Diagnosis present

## 2023-06-28 DIAGNOSIS — Z8673 Personal history of transient ischemic attack (TIA), and cerebral infarction without residual deficits: Secondary | ICD-10-CM

## 2023-06-28 DIAGNOSIS — I952 Hypotension due to drugs: Secondary | ICD-10-CM | POA: Diagnosis not present

## 2023-06-28 DIAGNOSIS — N189 Chronic kidney disease, unspecified: Secondary | ICD-10-CM | POA: Diagnosis not present

## 2023-06-28 DIAGNOSIS — I639 Cerebral infarction, unspecified: Secondary | ICD-10-CM

## 2023-06-28 DIAGNOSIS — I4891 Unspecified atrial fibrillation: Secondary | ICD-10-CM | POA: Diagnosis not present

## 2023-06-28 DIAGNOSIS — I611 Nontraumatic intracerebral hemorrhage in hemisphere, cortical: Secondary | ICD-10-CM | POA: Diagnosis not present

## 2023-06-28 DIAGNOSIS — M6281 Muscle weakness (generalized): Secondary | ICD-10-CM | POA: Diagnosis not present

## 2023-06-28 DIAGNOSIS — I6602 Occlusion and stenosis of left middle cerebral artery: Secondary | ICD-10-CM | POA: Diagnosis not present

## 2023-06-28 DIAGNOSIS — Z7901 Long term (current) use of anticoagulants: Secondary | ICD-10-CM

## 2023-06-28 DIAGNOSIS — S301XXA Contusion of abdominal wall, initial encounter: Secondary | ICD-10-CM | POA: Diagnosis not present

## 2023-06-28 DIAGNOSIS — R4182 Altered mental status, unspecified: Secondary | ICD-10-CM | POA: Insufficient documentation

## 2023-06-28 DIAGNOSIS — I517 Cardiomegaly: Secondary | ICD-10-CM | POA: Diagnosis not present

## 2023-06-28 DIAGNOSIS — I5042 Chronic combined systolic (congestive) and diastolic (congestive) heart failure: Secondary | ICD-10-CM | POA: Diagnosis not present

## 2023-06-28 DIAGNOSIS — N179 Acute kidney failure, unspecified: Secondary | ICD-10-CM | POA: Diagnosis present

## 2023-06-28 DIAGNOSIS — J9690 Respiratory failure, unspecified, unspecified whether with hypoxia or hypercapnia: Secondary | ICD-10-CM | POA: Diagnosis not present

## 2023-06-28 DIAGNOSIS — G459 Transient cerebral ischemic attack, unspecified: Secondary | ICD-10-CM | POA: Diagnosis not present

## 2023-06-28 DIAGNOSIS — G9389 Other specified disorders of brain: Secondary | ICD-10-CM | POA: Diagnosis not present

## 2023-06-28 DIAGNOSIS — C61 Malignant neoplasm of prostate: Secondary | ICD-10-CM

## 2023-06-28 DIAGNOSIS — N184 Chronic kidney disease, stage 4 (severe): Secondary | ICD-10-CM | POA: Diagnosis not present

## 2023-06-28 DIAGNOSIS — I4821 Permanent atrial fibrillation: Secondary | ICD-10-CM | POA: Diagnosis present

## 2023-06-28 DIAGNOSIS — I69320 Aphasia following cerebral infarction: Secondary | ICD-10-CM | POA: Diagnosis not present

## 2023-06-28 DIAGNOSIS — H9190 Unspecified hearing loss, unspecified ear: Secondary | ICD-10-CM | POA: Diagnosis present

## 2023-06-28 DIAGNOSIS — Z87442 Personal history of urinary calculi: Secondary | ICD-10-CM

## 2023-06-28 DIAGNOSIS — E785 Hyperlipidemia, unspecified: Secondary | ICD-10-CM | POA: Diagnosis not present

## 2023-06-28 DIAGNOSIS — Z6831 Body mass index (BMI) 31.0-31.9, adult: Secondary | ICD-10-CM

## 2023-06-28 DIAGNOSIS — I7143 Infrarenal abdominal aortic aneurysm, without rupture: Secondary | ICD-10-CM | POA: Diagnosis not present

## 2023-06-28 DIAGNOSIS — I63412 Cerebral infarction due to embolism of left middle cerebral artery: Secondary | ICD-10-CM | POA: Diagnosis not present

## 2023-06-28 DIAGNOSIS — Z7984 Long term (current) use of oral hypoglycemic drugs: Secondary | ICD-10-CM

## 2023-06-28 DIAGNOSIS — I2510 Atherosclerotic heart disease of native coronary artery without angina pectoris: Secondary | ICD-10-CM | POA: Diagnosis present

## 2023-06-28 DIAGNOSIS — G936 Cerebral edema: Secondary | ICD-10-CM | POA: Diagnosis present

## 2023-06-28 DIAGNOSIS — R1310 Dysphagia, unspecified: Secondary | ICD-10-CM | POA: Diagnosis not present

## 2023-06-28 DIAGNOSIS — R29726 NIHSS score 26: Secondary | ICD-10-CM | POA: Diagnosis not present

## 2023-06-28 DIAGNOSIS — Z4682 Encounter for fitting and adjustment of non-vascular catheter: Secondary | ICD-10-CM | POA: Diagnosis not present

## 2023-06-28 DIAGNOSIS — J9811 Atelectasis: Secondary | ICD-10-CM | POA: Diagnosis not present

## 2023-06-28 DIAGNOSIS — I5033 Acute on chronic diastolic (congestive) heart failure: Secondary | ICD-10-CM | POA: Diagnosis not present

## 2023-06-28 DIAGNOSIS — I69398 Other sequelae of cerebral infarction: Secondary | ICD-10-CM | POA: Diagnosis not present

## 2023-06-28 DIAGNOSIS — R4701 Aphasia: Secondary | ICD-10-CM | POA: Diagnosis present

## 2023-06-28 DIAGNOSIS — I672 Cerebral atherosclerosis: Secondary | ICD-10-CM | POA: Diagnosis not present

## 2023-06-28 DIAGNOSIS — Z9889 Other specified postprocedural states: Secondary | ICD-10-CM | POA: Diagnosis not present

## 2023-06-28 DIAGNOSIS — Z8719 Personal history of other diseases of the digestive system: Secondary | ICD-10-CM

## 2023-06-28 DIAGNOSIS — Z5986 Financial insecurity: Secondary | ICD-10-CM

## 2023-06-28 DIAGNOSIS — R414 Neurologic neglect syndrome: Secondary | ICD-10-CM | POA: Diagnosis not present

## 2023-06-28 DIAGNOSIS — R404 Transient alteration of awareness: Secondary | ICD-10-CM | POA: Diagnosis not present

## 2023-06-28 DIAGNOSIS — R531 Weakness: Secondary | ICD-10-CM | POA: Insufficient documentation

## 2023-06-28 DIAGNOSIS — R918 Other nonspecific abnormal finding of lung field: Secondary | ICD-10-CM | POA: Diagnosis not present

## 2023-06-28 DIAGNOSIS — Z743 Need for continuous supervision: Secondary | ICD-10-CM | POA: Diagnosis not present

## 2023-06-28 DIAGNOSIS — Z7401 Bed confinement status: Secondary | ICD-10-CM | POA: Diagnosis not present

## 2023-06-28 DIAGNOSIS — R253 Fasciculation: Secondary | ICD-10-CM | POA: Diagnosis not present

## 2023-06-28 DIAGNOSIS — I509 Heart failure, unspecified: Secondary | ICD-10-CM | POA: Diagnosis not present

## 2023-06-28 DIAGNOSIS — Z8546 Personal history of malignant neoplasm of prostate: Secondary | ICD-10-CM

## 2023-06-28 DIAGNOSIS — K7689 Other specified diseases of liver: Secondary | ICD-10-CM | POA: Diagnosis not present

## 2023-06-28 DIAGNOSIS — Z950 Presence of cardiac pacemaker: Secondary | ICD-10-CM

## 2023-06-28 DIAGNOSIS — Z833 Family history of diabetes mellitus: Secondary | ICD-10-CM

## 2023-06-28 DIAGNOSIS — I6503 Occlusion and stenosis of bilateral vertebral arteries: Secondary | ICD-10-CM | POA: Diagnosis not present

## 2023-06-28 DIAGNOSIS — I472 Ventricular tachycardia, unspecified: Secondary | ICD-10-CM | POA: Diagnosis not present

## 2023-06-28 DIAGNOSIS — J449 Chronic obstructive pulmonary disease, unspecified: Secondary | ICD-10-CM | POA: Diagnosis not present

## 2023-06-28 DIAGNOSIS — R1312 Dysphagia, oropharyngeal phase: Secondary | ICD-10-CM | POA: Diagnosis not present

## 2023-06-28 DIAGNOSIS — R0989 Other specified symptoms and signs involving the circulatory and respiratory systems: Secondary | ICD-10-CM | POA: Diagnosis not present

## 2023-06-28 DIAGNOSIS — I493 Ventricular premature depolarization: Secondary | ICD-10-CM | POA: Diagnosis present

## 2023-06-28 DIAGNOSIS — E669 Obesity, unspecified: Secondary | ICD-10-CM | POA: Diagnosis present

## 2023-06-28 DIAGNOSIS — I723 Aneurysm of iliac artery: Secondary | ICD-10-CM | POA: Diagnosis not present

## 2023-06-28 DIAGNOSIS — Z7985 Long-term (current) use of injectable non-insulin antidiabetic drugs: Secondary | ICD-10-CM

## 2023-06-28 HISTORY — PX: RADIOLOGY WITH ANESTHESIA: SHX6223

## 2023-06-28 HISTORY — PX: IR PERCUTANEOUS ART THROMBECTOMY/INFUSION INTRACRANIAL INC DIAG ANGIO: IMG6087

## 2023-06-28 HISTORY — DX: Calculus of kidney: N20.0

## 2023-06-28 HISTORY — PX: IR CT HEAD LTD: IMG2386

## 2023-06-28 HISTORY — DX: Sick sinus syndrome: I49.5

## 2023-06-28 LAB — CBC
HCT: 40.2 % (ref 39.0–52.0)
Hemoglobin: 13.1 g/dL (ref 13.0–17.0)
MCH: 30.8 pg (ref 26.0–34.0)
MCHC: 32.6 g/dL (ref 30.0–36.0)
MCV: 94.6 fL (ref 80.0–100.0)
Platelets: 148 10*3/uL — ABNORMAL LOW (ref 150–400)
RBC: 4.25 MIL/uL (ref 4.22–5.81)
RDW: 14.6 % (ref 11.5–15.5)
WBC: 8.1 10*3/uL (ref 4.0–10.5)
nRBC: 0 % (ref 0.0–0.2)

## 2023-06-28 LAB — COMPREHENSIVE METABOLIC PANEL WITH GFR
ALT: 20 U/L (ref 0–44)
AST: 24 U/L (ref 15–41)
Albumin: 2.9 g/dL — ABNORMAL LOW (ref 3.5–5.0)
Alkaline Phosphatase: 81 U/L (ref 38–126)
Anion gap: 11 (ref 5–15)
BUN: 44 mg/dL — ABNORMAL HIGH (ref 8–23)
CO2: 21 mmol/L — ABNORMAL LOW (ref 22–32)
Calcium: 8.7 mg/dL — ABNORMAL LOW (ref 8.9–10.3)
Chloride: 99 mmol/L (ref 98–111)
Creatinine, Ser: 2.26 mg/dL — ABNORMAL HIGH (ref 0.61–1.24)
GFR, Estimated: 27 mL/min — ABNORMAL LOW (ref >60)
Glucose, Bld: 169 mg/dL — ABNORMAL HIGH (ref 70–99)
Potassium: 4.6 mmol/L (ref 3.5–5.1)
Sodium: 131 mmol/L — ABNORMAL LOW (ref 135–145)
Total Bilirubin: 2.1 mg/dL — ABNORMAL HIGH (ref 0.0–1.2)
Total Protein: 6.8 g/dL (ref 6.5–8.1)

## 2023-06-28 LAB — APTT: aPTT: 40 s — ABNORMAL HIGH (ref 24–36)

## 2023-06-28 LAB — MAGNESIUM: Magnesium: 2.1 mg/dL (ref 1.7–2.4)

## 2023-06-28 LAB — POCT I-STAT 7, (LYTES, BLD GAS, ICA,H+H)
Acid-base deficit: 2 mmol/L (ref 0.0–2.0)
Bicarbonate: 21.8 mmol/L (ref 20.0–28.0)
Calcium, Ion: 1.22 mmol/L (ref 1.15–1.40)
HCT: 36 % — ABNORMAL LOW (ref 39.0–52.0)
Hemoglobin: 12.2 g/dL — ABNORMAL LOW (ref 13.0–17.0)
O2 Saturation: 99 %
Patient temperature: 97.5
Potassium: 4.3 mmol/L (ref 3.5–5.1)
Sodium: 133 mmol/L — ABNORMAL LOW (ref 135–145)
TCO2: 23 mmol/L (ref 22–32)
pCO2 arterial: 31 mmHg — ABNORMAL LOW (ref 32–48)
pH, Arterial: 7.452 — ABNORMAL HIGH (ref 7.35–7.45)
pO2, Arterial: 112 mmHg — ABNORMAL HIGH (ref 83–108)

## 2023-06-28 LAB — DIFFERENTIAL
Abs Immature Granulocytes: 0.06 10*3/uL (ref 0.00–0.07)
Basophils Absolute: 0 10*3/uL (ref 0.0–0.1)
Basophils Relative: 0 %
Eosinophils Absolute: 0 10*3/uL (ref 0.0–0.5)
Eosinophils Relative: 1 %
Immature Granulocytes: 1 %
Lymphocytes Relative: 13 %
Lymphs Abs: 1.1 10*3/uL (ref 0.7–4.0)
Monocytes Absolute: 1.6 10*3/uL — ABNORMAL HIGH (ref 0.1–1.0)
Monocytes Relative: 19 %
Neutro Abs: 5.4 10*3/uL (ref 1.7–7.7)
Neutrophils Relative %: 66 %

## 2023-06-28 LAB — PROTIME-INR
INR: 1.9 — ABNORMAL HIGH (ref 0.8–1.2)
Prothrombin Time: 21.7 s — ABNORMAL HIGH (ref 11.4–15.2)

## 2023-06-28 LAB — MRSA NEXT GEN BY PCR, NASAL: MRSA by PCR Next Gen: NOT DETECTED

## 2023-06-28 LAB — GLUCOSE, CAPILLARY
Glucose-Capillary: 135 mg/dL — ABNORMAL HIGH (ref 70–99)
Glucose-Capillary: 144 mg/dL — ABNORMAL HIGH (ref 70–99)

## 2023-06-28 LAB — ETHANOL: Alcohol, Ethyl (B): <15 mg/dL (ref <15)

## 2023-06-28 LAB — CBG MONITORING, ED: Glucose-Capillary: 161 mg/dL — ABNORMAL HIGH (ref 70–99)

## 2023-06-28 LAB — SARS CORONAVIRUS 2 BY RT PCR: SARS Coronavirus 2 by RT PCR: NEGATIVE

## 2023-06-28 SURGERY — RADIOLOGY WITH ANESTHESIA
Anesthesia: General

## 2023-06-28 MED ORDER — IOHEXOL 300 MG/ML  SOLN
150.0000 mL | Freq: Once | INTRAMUSCULAR | Status: AC | PRN
  Administered 2023-06-28: 30 mL via INTRA_ARTERIAL

## 2023-06-28 MED ORDER — ACETAMINOPHEN 650 MG RE SUPP
650.0000 mg | RECTAL | Status: DC | PRN
  Administered 2023-06-30: 650 mg via RECTAL
  Filled 2023-06-28 (×2): qty 1

## 2023-06-28 MED ORDER — ORAL CARE MOUTH RINSE
15.0000 mL | OROMUCOSAL | Status: DC
  Administered 2023-06-28 – 2023-06-30 (×22): 15 mL via OROMUCOSAL

## 2023-06-28 MED ORDER — POLYETHYLENE GLYCOL 3350 17 G PO PACK
17.0000 g | PACK | Freq: Every day | ORAL | Status: DC
  Administered 2023-06-30: 17 g
  Filled 2023-06-28 (×2): qty 1

## 2023-06-28 MED ORDER — NITROGLYCERIN 1 MG/10 ML FOR IR/CATH LAB
INTRA_ARTERIAL | Status: AC
  Filled 2023-06-28: qty 10

## 2023-06-28 MED ORDER — AMIODARONE HCL 200 MG PO TABS
400.0000 mg | ORAL_TABLET | Freq: Two times a day (BID) | ORAL | Status: DC
  Administered 2023-06-28 – 2023-06-29 (×2): 400 mg via ORAL
  Filled 2023-06-28 (×2): qty 2

## 2023-06-28 MED ORDER — IOHEXOL 350 MG/ML SOLN
60.0000 mL | Freq: Once | INTRAVENOUS | Status: AC | PRN
  Administered 2023-06-28: 60 mL via INTRAVENOUS

## 2023-06-28 MED ORDER — CLEVIDIPINE BUTYRATE 0.5 MG/ML IV EMUL
0.0000 mg/h | INTRAVENOUS | Status: DC
  Administered 2023-06-28: 2 mg/h via INTRAVENOUS

## 2023-06-28 MED ORDER — VERAPAMIL HCL ER 180 MG PO TBCR
180.0000 mg | EXTENDED_RELEASE_TABLET | Freq: Every day | ORAL | Status: DC
  Filled 2023-06-28: qty 1

## 2023-06-28 MED ORDER — FENTANYL CITRATE PF 50 MCG/ML IJ SOSY: 25.0000 ug | PREFILLED_SYRINGE | INTRAMUSCULAR | Status: DC | PRN

## 2023-06-28 MED ORDER — ROCURONIUM BROMIDE 10 MG/ML (PF) SYRINGE
PREFILLED_SYRINGE | INTRAVENOUS | Status: DC | PRN
  Administered 2023-06-28: 100 mg via INTRAVENOUS

## 2023-06-28 MED ORDER — PROPOFOL 1000 MG/100ML IV EMUL
0.0000 ug/kg/min | INTRAVENOUS | Status: DC
  Administered 2023-06-28: 50 ug/kg/min via INTRAVENOUS
  Administered 2023-06-29 (×3): 30 ug/kg/min via INTRAVENOUS
  Administered 2023-06-30 (×3): 20 ug/kg/min via INTRAVENOUS
  Filled 2023-06-28 (×8): qty 100

## 2023-06-28 MED ORDER — ACETAMINOPHEN 160 MG/5ML PO SOLN: 650.0000 mg | ORAL | Status: DC | PRN

## 2023-06-28 MED ORDER — CHLORHEXIDINE GLUCONATE CLOTH 2 % EX PADS
6.0000 | MEDICATED_PAD | Freq: Every day | CUTANEOUS | Status: DC
  Administered 2023-06-28 – 2023-07-03 (×5): 6 via TOPICAL

## 2023-06-28 MED ORDER — LACTATED RINGERS IV SOLN
INTRAVENOUS | Status: DC | PRN
Start: 2023-06-28 — End: 2023-06-28

## 2023-06-28 MED ORDER — PHENYLEPHRINE 80 MCG/ML (10ML) SYRINGE FOR IV PUSH (FOR BLOOD PRESSURE SUPPORT)
PREFILLED_SYRINGE | INTRAVENOUS | Status: DC | PRN
  Administered 2023-06-28: 80 ug via INTRAVENOUS

## 2023-06-28 MED ORDER — PROPOFOL 10 MG/ML IV BOLUS
INTRAVENOUS | Status: AC
  Filled 2023-06-28: qty 20

## 2023-06-28 MED ORDER — ACETAMINOPHEN 325 MG PO TABS
650.0000 mg | ORAL_TABLET | ORAL | Status: DC | PRN
  Filled 2023-06-28: qty 2

## 2023-06-28 MED ORDER — IOHEXOL 300 MG/ML  SOLN: 100.0000 mL | Freq: Once | INTRAMUSCULAR | Status: DC | PRN

## 2023-06-28 MED ORDER — CLEVIDIPINE BUTYRATE 0.5 MG/ML IV EMUL
0.0000 mg/h | INTRAVENOUS | Status: DC
  Filled 2023-06-28: qty 50

## 2023-06-28 MED ORDER — SENNOSIDES-DOCUSATE SODIUM 8.6-50 MG PO TABS
1.0000 | ORAL_TABLET | Freq: Every evening | ORAL | Status: DC | PRN
  Filled 2023-06-28: qty 1

## 2023-06-28 MED ORDER — LIDOCAINE 2% (20 MG/ML) 5 ML SYRINGE
INTRAMUSCULAR | Status: DC | PRN
  Administered 2023-06-28: 100 mg via INTRAVENOUS

## 2023-06-28 MED ORDER — ACETAMINOPHEN 325 MG PO TABS: 650.0000 mg | ORAL_TABLET | ORAL | Status: DC | PRN

## 2023-06-28 MED ORDER — ORAL CARE MOUTH RINSE: 15.0000 mL | OROMUCOSAL | Status: DC | PRN

## 2023-06-28 MED ORDER — PHENYLEPHRINE HCL-NACL 20-0.9 MG/250ML-% IV SOLN
INTRAVENOUS | Status: DC | PRN
  Administered 2023-06-28: 50 ug/min via INTRAVENOUS

## 2023-06-28 MED ORDER — SODIUM CHLORIDE 0.9 % IV SOLN: INTRAVENOUS | Status: DC

## 2023-06-28 MED ORDER — FAMOTIDINE 20 MG PO TABS
20.0000 mg | ORAL_TABLET | Freq: Two times a day (BID) | ORAL | Status: DC
Start: 2023-06-28 — End: 2023-06-30
  Administered 2023-06-29 (×2): 20 mg
  Filled 2023-06-28 (×2): qty 1

## 2023-06-28 MED ORDER — STROKE: EARLY STAGES OF RECOVERY BOOK
Freq: Once | Status: AC
  Filled 2023-06-28: qty 1

## 2023-06-28 MED ORDER — PROPOFOL 10 MG/ML IV BOLUS
INTRAVENOUS | Status: DC | PRN
  Administered 2023-06-28: 120 mg via INTRAVENOUS

## 2023-06-28 MED ORDER — PROPOFOL 500 MG/50ML IV EMUL
INTRAVENOUS | Status: DC | PRN
Start: 2023-06-28 — End: 2023-06-28
  Administered 2023-06-28: 50 ug/kg/min via INTRAVENOUS

## 2023-06-28 MED ORDER — ACETAMINOPHEN 650 MG RE SUPP: 650.0000 mg | RECTAL | Status: DC | PRN

## 2023-06-28 MED ORDER — ACETAMINOPHEN 160 MG/5ML PO SOLN
650.0000 mg | ORAL | Status: DC | PRN
  Administered 2023-06-29: 650 mg
  Filled 2023-06-28 (×2): qty 20.3

## 2023-06-28 MED ORDER — ONDANSETRON HCL 4 MG/2ML IJ SOLN
INTRAMUSCULAR | Status: DC | PRN
  Administered 2023-06-28: 4 mg via INTRAVENOUS

## 2023-06-28 MED ORDER — DOCUSATE SODIUM 50 MG/5ML PO LIQD
100.0000 mg | Freq: Two times a day (BID) | ORAL | Status: DC | PRN
Start: 2023-06-28 — End: 2023-07-01

## 2023-06-28 NOTE — ED Provider Notes (Signed)
 [name] Provider Note    Event Date/Time   First MD Initiated Contact with Patient 06/28/23 [phone]     (approximate)   History   Code Stroke   HPI  [name] is a 89 y.o. male with history of A-fib, CHF, CKD, diabetes, hypertension who presents with facial droop, weakness, decreased mentation of acute onset.  Code stroke activated     Physical Exam   Triage Vital Signs: ED Triage Vitals  Encounter Vitals Group     BP 06/28/23 0920 121/69     Systolic BP Percentile --      Diastolic BP Percentile --      Pulse Rate 06/28/23 0920 86     Resp 06/28/23 0920 14     Temp --      Temp src --      SpO2 06/28/23 0920 91 %     Weight 06/28/23 0931 109.9 kg (242 lb 3.2 oz)     Height --      Head Circumference --      Peak Flow --      Pain Score --      Pain Loc --      Pain Education --      Exclude from Growth Chart --     Most recent vital signs: Vitals:   06/28/23 0937 06/28/23 0945  BP: (!) 142/101 126/80  Pulse: (!) 118 88  Resp: (!) 23 (!) 22  Temp:    SpO2: 93% 94%     General: Awake, not answering questions appropriately CV:  Good peripheral perfusion.  Resp:  Normal effort.  Abd:  No distention.  Other:  Right-sided facial droop, right-sided weakness, unable to speak   ED Results / Procedures / Treatments   Labs (all labs ordered are listed, but only abnormal results are displayed) Labs Reviewed  PROTIME-INR - Abnormal; Notable for the following components:      Result Value   Prothrombin Time 21.7 (*)    INR 1.9 (*)    All other components within normal limits  APTT - Abnormal; Notable for the following components:   aPTT 40 (*)    All other components within normal limits  CBC - Abnormal; Notable for the following components:   Platelets 148 (*)    All other components within normal limits  DIFFERENTIAL - Abnormal; Notable for the following components:   Monocytes Absolute 1.6 (*)    All other  components within normal limits  COMPREHENSIVE METABOLIC PANEL WITH GFR - Abnormal; Notable for the following components:   Sodium 131 (*)    CO2 21 (*)    Glucose, Bld 169 (*)    BUN 44 (*)    Creatinine, Ser 2.26 (*)    Calcium  8.7 (*)    Albumin  2.9 (*)    Total Bilirubin 2.1 (*)    GFR, Estimated 27 (*)    All other components within normal limits  CBG MONITORING, ED - Abnormal; Notable for the following components:   Glucose-Capillary 161 (*)    All other components within normal limits  SARS CORONAVIRUS 2 BY RT PCR  ETHANOL  URINE DRUG SCREEN, QUALITATIVE ([facility] ONLY)     EKG  ED ECG REPORT I, Bryson Carbine, the attending physician, personally viewed and interpreted this ECG.  Date: 06/28/2023  Rhythm: AV pacer QRS Axis: Normal Intervals: Abnormal ST/T Wave abnormalities: Abnormal Narrative Interpretation: no evidence of acute ischemia    RADIOLOGY Radiologist notes  concerning CT head likely acute CVA    PROCEDURES:  Critical Care performed: yes  CRITICAL CARE Performed by: Bryson Carbine   Total critical care time: 30 minutes  Critical care time was exclusive of separately billable procedures and treating other patients.  Critical care was necessary to treat or prevent imminent or life-threatening deterioration.  Critical care was time spent personally by me on the following activities: development of treatment plan with patient and/or surrogate as well as nursing, discussions with consultants, evaluation of patient's response to treatment, examination of patient, obtaining history from patient or surrogate, ordering and performing treatments and interventions, ordering and review of laboratory studies, ordering and review of radiographic studies, pulse oximetry and re-evaluation of patient's condition.   Procedures   MEDICATIONS ORDERED IN ED: Medications  iohexol  (OMNIPAQUE ) 350 MG/ML injection 60 mL (60 mLs Intravenous Contrast Given 06/28/23 0921)      IMPRESSION / MDM / ASSESSMENT AND PLAN / ED COURSE  I reviewed the triage vital signs and the nursing notes. Patient's presentation is most consistent with acute presentation with potential threat to life or bodily function.  Patient presents with acute neurodeficit, code stroke activated presentation consistent with LVO  Stat CT, seen by neurology Dr. Cleone Dad, CT angio is consistent with left M1 occlusion  Neurology has discussed with neurointerventional who is aware, I am arranging transfer from our ED to [facility] stat  Lab work reviewed  Discussed with Dr. Trish Furl to make him aware of ED to ED transfer.  Dr. Doretta Gant of neurology is accepting the patient for admission      FINAL CLINICAL IMPRESSION(S) / ED DIAGNOSES   Final diagnoses:  Acute ischemic stroke [facility])     Rx / DC Orders   ED Discharge Orders     None        Note:  This document was prepared using Dragon voice recognition software and may include unintentional dictation errors.   Bryson Carbine, MD 06/28/23 1000

## 2023-06-28 NOTE — Progress Notes (Signed)
   06/28/23 0900  Spiritual Encounters  Type of Visit Initial  Care provided to: Pt not available  Referral source Code page  Reason for visit Code  OnCall Visit No   Chaplain responded to a code stroke. The patient, [name] was attended to by the medical team. No family was present. If a chaplain is requested someone will respond.   Clarence Croak [facility] [phone]

## 2023-06-28 NOTE — Progress Notes (Signed)
 eLink Physician-Brief Progress Note Patient Name: [name] DOB: [birth year] MRN: [id number]   Date of Service  06/28/2023  HPI/Events of Note  Lots of ectopy. Unable to get Amio PO d/t intubation. Does not have OGT/NGT.  Would you like one placed and order for amio per tube.  Or start amio gtt, no OGT/NGT and hold PO meds?  CHF, CVA. 7.45/31/112 from noon Last potassium 4.3 Ion ca normal. Mag 2.4 from 5 th.   Camera: On Vent, in synchrony. Ectopy PVC's. Paced.    eICU Interventions  Mag stat. NG/OG  tube placement . KUB. Then can give amiodarone  via OG tube.   Discussed with RN      Intervention Category Intermediate Interventions: Arrhythmia - evaluation and management  Rexann Catalan 06/28/2023, 8:50 PM  00:16 Unable to give ordered verapamil  per tube. Pharmacy said you can change it to immediate release (IR) verapamil .  - changed. To 40 mg IR q8 hrly, wean off cleviprex . Latest SBP 120.

## 2023-06-28 NOTE — H&P (Signed)
 NEUROLOGY H&P NOTE   Date of service: June 28, 2023 Patient Name: [name] MRN:  [id number] DOB:  Sep 12, 1933 Chief Complaint: "CODE STROKE"  History of Present Illness  [name] is a 89 y.o. male with hx of A-fib on edoxaban , CHF, Left MCA stroke 2021 status post thrombectomy with no residual symptoms, DM, HTN, HLD, OSA on CPAP, CKD 3b, Prostate Cancer (on Xtandi  and leuprolide ), HoH who was brought in as an EMS-activated code stroke to [facility] due to acute onset of right facial droop, right-sided weakness, altered mental status.  Patient was also noted to be hypoxic with oxygen  saturation 85% in the ED, patient does not use oxygen  at home.   On neurology exam at [facility], patient's NIH was 25. CTH with possible acute cytotoxic edema in the left MCA territory.  CTA showed left M1 occlusion. Patient not a candidate for TNK administration due to edoxaban  dose last night  Risks, benefits and alternatives of IVT discussed by Dr. Cleone Dad with patient's healthcare power of attorney, [name] over the telephone, and they agreed. Consent documented in chart by Dr. Cleone Dad. Patient was then transferred to [facility] for emergent thrombectomy procedure.   On arrival to [facility], patient was assessed by Neurology team and Rapid Response RN. He was on supplemental oxygen . He was drowsy, no verbal output, did not follow commands, right hemianopia, left forced gaze, right facial droop, dense right hemiplegia. NIH: 26. Taken directly to IR suite for procedure.   Last known well: 0800 Modified rankin score: 0-Completely asymptomatic and back to baseline post- stroke IV Thrombolysis: No, due to anticoagulation medication taken last night Thrombectomy: Yes, pending IR thrombectomy procedure  NIHSS components Score: Comment  1a Level of Conscious 0[]  1[x]  2[]  3[]      1b LOC Questions 0[]  1[]  2[x]       1c LOC Commands 0[]  1[]  2[x]       2 Best Gaze 0[]  1[]  2[x]       3 Visual 0[]  1[]  2[x]  3[]      4  Facial Palsy 0[]  1[]  2[x]  3[]      5a Motor Arm - left 0[x]  1[]  2[]  3[]  4[]  UN[]    5b Motor Arm - Right 0[]  1[]  2[]  3[]  4[x]  UN[]    6a Motor Leg - Left 0[]  1[x]  2[]  3[]  4[]  UN[]    6b Motor Leg - Right 0[]  1[]  2[]  3[x]  4[]  UN[]    7 Limb Ataxia 0[]  1[]  2[]  UN[x]      8 Sensory 0[x]  1[]  2[]  UN[]      9 Best Language 0[]  1[]  2[]  3[x]      10 Dysarthria 0[]  1[]  2[x]  UN[]      11 Extinct. and Inattention 0[]  1[]  2[x]       TOTAL:   26      ROS   Unable to perform due to AMS  Past History   Past Medical History:  Diagnosis Date   Acute deep vein thrombosis (DVT) of femoral vein of left lower extremity (HCC)    Atrial fibrillation (HCC)    CHF (congestive heart failure) (HCC)    Chronic kidney disease    Diabetes (HCC)    Hearing loss    High blood pressure    History of bladder problems    Hyperlipidemia    Kidney stones 11/22/2014   Liver cyst    OSA (obstructive sleep apnea)    Prostate cancer (HCC)    Prostate cancer (HCC)    Stroke (HCC)    Valvular heart disease  Past Surgical History:  Procedure Laterality Date   CATARACT EXTRACTION     COLONOSCOPY WITH PROPOFOL  N/A 07/02/2019   Procedure: COLONOSCOPY WITH PROPOFOL ;  Surgeon: Selena Daily, MD;  Location: [facility] ENDOSCOPY;  Service: Gastroenterology;  Laterality: N/A;   ESOPHAGOGASTRODUODENOSCOPY (EGD) WITH PROPOFOL  N/A 03/19/2022   Procedure: ESOPHAGOGASTRODUODENOSCOPY (EGD) WITH PROPOFOL ;  Surgeon: Luke Salaam, MD;  Location: [facility] ENDOSCOPY;  Service: Gastroenterology;  Laterality: N/A;   IR CT HEAD LTD  08/27/2019   IR PERCUTANEOUS ART THROMBECTOMY/INFUSION INTRACRANIAL INC DIAG ANGIO  08/27/2019       IR PERCUTANEOUS ART THROMBECTOMY/INFUSION INTRACRANIAL INC DIAG ANGIO  08/27/2019   LEFT HEART CATH AND CORONARY ANGIOGRAPHY N/A 06/24/2023   Procedure: LEFT HEART CATH AND CORONARY ANGIOGRAPHY;  Surgeon: Percival Brace, MD;  Location: [facility] INVASIVE CV LAB;  Service: Cardiovascular;  Laterality: N/A;   LOWER EXTREMITY  ANGIOGRAPHY Left 12/25/2020   Procedure: Lower Extremity Angiography;  Surgeon: Celso College, MD;  Location: [facility] INVASIVE CV LAB;  Service: Cardiovascular;  Laterality: Left;   PACEMAKER LEADLESS INSERTION N/A 12/19/2020   Procedure: PACEMAKER LEADLESS INSERTION;  Surgeon: Percival Brace, MD;  Location: [facility] INVASIVE CV LAB;  Service: Cardiovascular;  Laterality: N/A;   PROSTATE CANCER     PROSTATE SURGERY     RADIOLOGY WITH ANESTHESIA N/A 08/27/2019   Procedure: IR WITH ANESTHESIA;  Surgeon: Luellen Sages, MD;  Location: [facility] OR;  Service: Radiology;  Laterality: N/A;   Family History  Problem Relation Age of Onset   Colon cancer Mother    Diabetes Other    High blood pressure Other    Prostate cancer Brother    Diabetes Brother    Social History   Socioeconomic History   Marital status: Married    Spouse name: Salif Tay   Number of children: 2   Years of education: 12   Highest education level: 12th grade  Occupational History   Occupation: [employer]  Tobacco Use   Smoking status: Former    Types: Cigarettes   Smokeless tobacco: Never  Vaping Use   Vaping status: Never Used  Substance and Sexual Activity   Alcohol use: No   Drug use: No   Sexual activity: Not Currently  Other Topics Concern   Not on file  Social History Narrative   Not on file   Social Drivers of Health   Financial Resource Strain: Low Risk  (10/29/2019)   Overall Financial Resource Strain (CARDIA)    Difficulty of Paying Living Expenses: Not very hard  Recent Concern: Financial Resource Strain - Medium Risk (09/08/2019)   Overall Financial Resource Strain (CARDIA)    Difficulty of Paying Living Expenses: Somewhat hard  Food Insecurity: No Food Insecurity (06/23/2023)   Hunger Vital Sign    Worried About Running Out of Food in the Last Year: Never true    Ran Out of Food in the Last Year: Never true  Transportation Needs: No Transportation Needs (06/23/2023)   PRAPARE - Therapist, art (Medical): No    Lack of Transportation (Non-Medical): No  Physical Activity: Inactive (10/29/2019)   Exercise Vital Sign    Days of Exercise per Week: 0 days    Minutes of Exercise per Session: 0 min  Stress: No Stress Concern Present (10/29/2019)   [employer] of Occupational Health - Occupational Stress Questionnaire    Feeling of Stress : Only a little  Recent Concern: Stress - Stress Concern Present (09/08/2019)   [employer] of Occupational Health -  Occupational Stress Questionnaire    Feeling of Stress : To some extent  Social Connections: Moderately Integrated (06/23/2023)   Social Connection and Isolation Panel [NHANES]    Frequency of Communication with Friends and Family: More than three times a week    Frequency of Social Gatherings with Friends and Family: More than three times a week    Attends Religious Services: More than 4 times per year    Active Member of [employer] or Organizations: No    Attends Banker Meetings: Never    Marital Status: Married   No Known Allergies  Medications   Current Facility-Administered Medications:    [START ON 06/29/2023]  stroke: early stages of recovery book, , Does not apply, Once, [place], Erin C, NP   0.9 %  sodium chloride  infusion, , Intravenous, Continuous, Lehner, Erin C, NP   acetaminophen  (TYLENOL ) tablet 650 mg, 650 mg, Oral, Q4H PRN **OR** acetaminophen  (TYLENOL ) 160 MG/5ML solution 650 mg, 650 mg, Per Tube, Q4H PRN **OR** acetaminophen  (TYLENOL ) suppository 650 mg, 650 mg, Rectal, Q4H PRN, Lehner, Erin C, NP   senna-docusate (Senokot-S) tablet 1 tablet, 1 tablet, Oral, QHS PRN, Lehner, Erin C, NP  Facility-Administered Medications Ordered in Other Encounters:    lactated ringers  infusion, , Intravenous, Continuous PRN, Hebert Littler, CRNA, New Bag at 06/28/23 1031   lidocaine  2% (20 mg/mL) 5 mL syringe, , Intravenous, Anesthesia Intra-op, Hebert Littler, CRNA, 100 mg at 06/28/23  1038   ondansetron  (ZOFRAN ) injection, , Intravenous, Anesthesia Intra-op, Hebert Littler, CRNA, 4 mg at 06/28/23 1049   PHENYLephrine  80 mcg/ml in normal saline Adult IV Push Syringe (For Blood Pressure Support), , Intravenous, Anesthesia Intra-op, Hebert Littler, CRNA, 80 mcg at 06/28/23 1038   propofol  (DIPRIVAN ) 10 mg/mL bolus/IV push, , Intravenous, Anesthesia Intra-op, Hebert Littler, CRNA, 120 mg at 06/28/23 1038   rocuronium  (ZEMURON ) injection, , Intravenous, Anesthesia Intra-op, Hebert Littler, CRNA, 100 mg at 06/28/23 1038   Vitals   There were no vitals filed for this visit.   There is no height or weight on file to calculate BMI.  Physical Exam   Constitutional: Appears acutely ill Cardiovascular:  Respiratory: Effort normal, non-labored breathing. Requiring supplemental oxygen .   Neurologic Examination   Mental Status: Patient is alert and responds to voice, does not track.  Does not follow any commands.   No verbal output Cranial Nerves: II: Right hemianopia, no blink to threat. III,IV, VI: Left gaze forced deviation.  Does not cross midline. VII Right facial droop.  VIII: hearing is intact to voice Motor: LUE: no drift LLE: unable to hold antigravity RUE: No spontaneous movement, no withdraw to pain RLE: Slight withdraw to pain only.  Cerebellar: Unable to assess secondary to patient's mental status  Gait:  Deferred   Labs   CBC:  Recent Labs  Lab 06/23/23 1857 06/24/23 0329 06/26/23 0405 06/28/23 0913  WBC 5.4   < > 5.6 8.1  NEUTROABS 2.4  --   --  5.4  HGB 14.0   < > 14.4 13.1  HCT 43.2   < > 44.5 40.2  MCV 96.0   < > 94.9 94.6  PLT 166   < > 131* 148*   < > = values in this interval not displayed.   Basic Metabolic Panel:  Lab Results  Component Value Date   NA 131 (L) 06/28/2023   K 4.6 06/28/2023   CO2 21 (L) 06/28/2023   GLUCOSE 169 (H) 06/28/2023  BUN 44 (H) 06/28/2023   CREATININE 2.26 (H) 06/28/2023   CALCIUM   8.7 (L) 06/28/2023   GFRNONAA 27 (L) 06/28/2023   GFRAA 28 (L) 10/12/2019   Lipid Panel:  Lab Results  Component Value Date   LDLCALC 27 08/28/2019   HgbA1c:  Lab Results  Component Value Date   HGBA1C 8.7 (A) 03/10/2023   Urine Drug Screen:     Component Value Date/Time   LABOPIA NEGATIVE 06/11/2013 2253   COCAINSCRNUR NEGATIVE 06/11/2013 2253   LABBENZ NEGATIVE 06/11/2013 2253   AMPHETMU NEGATIVE 06/11/2013 2253   THCU NEGATIVE 06/11/2013 2253   LABBARB NEGATIVE 06/11/2013 2253    Alcohol Level     Component Value Date/Time   [facility] <15 06/28/2023 0913   INR  Lab Results  Component Value Date   INR 1.9 (H) 06/28/2023   APTT  Lab Results  Component Value Date   APTT 40 (H) 06/28/2023    CT Head without contrast(Personally reviewed): Aspects 7 versus early cytotoxic edema Potential hyperdense vessel sign   CT angio Head and Neck with contrast(Personally reviewed): Left M1 occlusion  Assessment   [name] is a 89 y.o. male with hx of A-fib on edoxaban , CHF, Left MCA stroke 2021 status post thrombectomy with no residual symptoms, DM, HTN, HLD, OSA on CPAP, CKD 3b, Prostate Cancer, HoH who was brought in as an EMS-activated code stroke to [facility] due to acute onset of right facial droop, right-sided weakness, altered mental status. NIH: 25.  CTH with possible acute cytotoxic edema in the left MCA territory.  CTA showed left M1 occlusion. Patient not a candidate for TNK administration due to edoxaban  dose last night. Transferred to [facility] for emergent thrombectomy. NIH on arrival to [facility]: 26. Admitted for Left MCA territory infarct. Currently in IR will admit to 4N post-procedure  Primary Diagnosis:  Acute ischemic infarct due to left M1 occlusion  Secondary Diagnosis: Hx of left MCA CVA 2021 s/p thrombectomy Atrial fibrillation CHF Hypertension OSA Hyperlipidemia DM2 CKD History of DVT left lower extremity Stage IV Prostate  Cancer Obesity  Recommendations  - Frequent Neuro checks per stroke unit protocol - MRI Brain stroke protocol - TTE not needed as patient recently had completed - Lipid panel - Statin - will be increased if LDL>70 or otherwise medically indicated  Patient is on Lipitor 10mg  at home - A1C - Antithrombotic - PER IR  HOLD PTA Edoxaban  - DVT ppx - SCDs, DVT ppx per IR - Blood Pressure Goal: SBP 120-160 for first 24 hours then less than 180  PRN labetalol  if HR>60 and PRN Hydralazine  if HR<60 Cleviprex  gtt if not controlled with IVP  - CBG Q4H   Will add sliding scale insulin  if needed for BG control - Monitor intake and output - Telemetry monitoring for arrhythmia  - Swallow screen - will be performed prior to PO intake - Stroke education - will be given - PT/OT/SLP  - Dispo: Admit to ICU.  CCM consulted for patient's extensive cardiac history and ventilator management ______________________________________________________________________   Signed, Audrene Lease, NP [facility]  Risks, benefits and alternatives of IVT discussed by Dr. Cleone Dad with patient's healthcare power of attorney, [name] and they agreed.  . Attending Neurohospitalist Addendum Patient seen and examined with APP/Resident. Agree with the history and physical as documented above. Agree with the plan as documented, which I helped formulate. I have edited the note above to reflect my full findings and recommendations. I have independently reviewed the  chart, obtained history, review of systems and examined the patient.I have personally reviewed pertinent head/neck/spine imaging (CT/MRI). Please feel free to call with any questions.  This patient is critically ill and at significant risk of neurological worsening, death and care requires constant monitoring of vital signs, hemodynamics,respiratory and cardiac monitoring, neurological assessment, discussion with family, other specialists and  medical decision making of high complexity. I spent 60 minutes of neurocritical care time  in the care of  this patient. This was time spent independent of any time provided by nurse practitioner or PA.  Greg Leaks, MD [facility] [phone]  If 7pm- 7am, please page neurology on call as listed in AMION.

## 2023-06-28 NOTE — ED Notes (Signed)
 Charge nurse at [facility] updated on pt's pending transfer and IR activation.

## 2023-06-28 NOTE — Procedures (Signed)
 INR.  Status post left common carotid arteriogram  Left CFA approach.  Findings.  1.  Occluded left middle cerebral artery proximal M1 segment.  Status post complete revascularization of the left middle cerebral artery with 1 pass with an 068 aspiration catheter achieving a TICI 3 revascularization.  Post CT of the brain with no evidence for intracranial  hemorrhage.  8 French Angio-Seal closure device was deployed for hemostasis at the left groin puncture site.  Distal pulses all present.  Patient left intubated due to patient's severe agitation and medical condition prior to the procedure.  Jory Ng MD.

## 2023-06-28 NOTE — Consult Note (Signed)
 NAME:  [name], MRN:  [id number], DOB:  Aug 16, 1933, LOS: 0 ADMISSION DATE:  06/28/2023, CONSULTATION DATE:  06/28/2023  REFERRING MD:  Alvira Josephs, neuro IR, CHIEF COMPLAINT: Right-sided weakness  History of Present Illness:  89 year old man who was recently admitted 6/2-06/27/2023 with hypotension, altered mental status, monomorphic V. tach requiring DCCV.  LHC did not show obstructive CAD.  He was discharged on verapamil . LKW was 0800 on 6/7.  He developed right-sided weakness and was noted to have right facial droop and aphasia on arrival to [facility] ED, initial saturation was 85% improved to 95% on 2 L Head CT showed hyperdense sign with aspects 7 CT angio confirmed left M1 occlusion. He was not given TNK due to DOAC last dose night before admission He underwent aspiration thrombectomy of occluded left MCA proximal M1 segment, required Neo-Synephrine during the procedure and brought to PACU intubated.  PCCM consulted   Pertinent  Medical History  Afib on edoxaban ,  CHF,  left MCA CVA in 2021 s/p thrombectomy with no residual,  Diabetes -2,  HTN, HLD,  OSA on CPAP,  hard of hearing,  CKD IIIb,  prostate Ca   Significant Hospital Events: Including procedures, antibiotic start and stop dates in addition to other pertinent events     Interim History / Subjective:  Critically ill, intubated and sedated on propofol  On Neo-Synephrine drip  Objective    Blood pressure 127/79, pulse 70, resp. rate 17, height 6' 0.01" (1.829 m), SpO2 99%.    Vent Mode: PRVC FiO2 (%):  [50 %] 50 % Set Rate:  [16 bmp] 16 bmp Vt Set:  [620 mL] 620 mL PEEP:  [5 cmH20] 5 cmH20 Plateau Pressure:  [19 cmH20] 19 cmH20   Intake/Output Summary (Last 24 hours) at 06/28/2023 1323 Last data filed at 06/28/2023 1122 Gross per 24 hour  Intake 400 ml  Output --  Net 400 ml   There were no vitals filed for this visit.  Examination: General: Elderly, obese man, on antibiotic daughter distress HENT: Pinpoint  pupils, no icterus, no JVD Lungs: Clear breath sounds bilateral, normal accessory muscle use Cardiovascular: S1-S2 no murmur Abdomen: Soft, nontender, obese Extremities: 1+ edema Neuro: Unresponsive, sedated, RASS -3   Labs show hyponatremia 131, BUN/creatinine increased to 44/2.2, total bilirubin slightly cytosis, stable, and  Resolved problem list   Assessment and Plan    Acute ischemic stroke -left MCA status post aspiration lobectomy Neurology to brain imaging and workup including repeat echo/carotid duplex  Acute respiratory failure with hypoxia -he was hypoxic to 85% on arrival to ED Previous spirometry 2022 has shown mild restriction, likely related to obesity On edoxaban  so-PE is not a consideration - Vent settings reviewed and adjusted - Await ABG and chest x-ray  History of V. Tach requiring DCCV 06/2023-continue verapamil  and amiodarone  -plan was for 400mg  twice daily for 5 days, then 400 mg daily for days then 200 mg daily  Permanent atrial fibrillation -continue edoxaban  when okay with neurology HFpEF -new mildly reduced EF 45 to 50%, recent LHC no significant CAD -Resume Farxiga  and Lasix   AKI on CKD -monitor renal function and output Watch out for contrast injury  Stage IV metastatic prostate cancer -on hormonal therapy  Best Practice (right click and "Reselect all SmartList Selections" daily)   Diet/type: NPO DVT prophylaxis DOAC Pressure ulcer(s): N/A GI prophylaxis: N/A Lines: N/A Foley:  N/A Code Status:  full code Last date of multidisciplinary goals of care discussion [NA]  Labs   CBC: Recent  Labs  Lab 06/23/23 1857 06/24/23 0329 06/25/23 0355 06/26/23 0405 06/28/23 0913  WBC 5.4 4.9 5.5 5.6 8.1  NEUTROABS 2.4  --   --   --  5.4  HGB 14.0 14.7 14.3 14.4 13.1  HCT 43.2 45.7 43.6 44.5 40.2  MCV 96.0 96.0 94.4 94.9 94.6  PLT 166 154 152 131* 148*    Basic Metabolic Panel: Recent Labs  Lab 06/23/23 1857 06/24/23 0329 06/25/23 0355  06/26/23 0405 06/27/23 0620 06/28/23 0913  NA 140 142 138 136 134* 131*  K 3.7 3.7 3.8 4.6 4.4 4.6  CL 110 106 105 104 104 99  CO2 19* 29 27 26 23  21*  GLUCOSE 175* 112* 131* 135* 144* 169*  BUN 28* 28* 22 24* 30* 44*  CREATININE 2.00* 1.83* 1.69* 1.76* 1.68* 2.26*  CALCIUM  8.5* 9.1 9.1 8.9 8.7* 8.7*  MG 2.0 2.1 2.0 2.4  --   --    GFR: Estimated Creatinine Clearance: 28.9 mL/min (A) (by C-G formula based on SCr of 2.26 mg/dL (H)). Recent Labs  Lab 06/24/23 0329 06/25/23 0355 06/26/23 0405 06/28/23 0913  WBC 4.9 5.5 5.6 8.1    Liver Function Tests: Recent Labs  Lab 06/23/23 1857 06/24/23 0329 06/28/23 0913  AST 19 26 24   ALT 11 16 20   ALKPHOS 107 106 81  BILITOT 0.6 0.5 2.1*  PROT 6.4* 6.7 6.8  ALBUMIN  3.0* 3.2* 2.9*   No results for input(s): "LIPASE", "AMYLASE" in the last 168 hours. No results for input(s): "AMMONIA" in the last 168 hours.  ABG    Component Value Date/Time   HCO3 27.3 09/21/2022 1237   O2SAT 38.1 09/21/2022 1237     Coagulation Profile: Recent Labs  Lab 06/28/23 0913  INR 1.9*    Cardiac Enzymes: No results for input(s): "CKTOTAL", "CKMB", "CKMBINDEX", "TROPONINI" in the last 168 hours.  HbA1C: Hemoglobin A1C  Date/Time Value Ref Range Status  03/10/2023 03:37 PM 8.7 (A) 4.0 - 5.6 % Final  10/04/2022 10:45 AM 7.5 (A) 4.0 - 5.6 % Final   Hgb A1c MFr Bld  Date/Time Value Ref Range Status  08/28/2019 12:58 PM 6.4 (H) 4.8 - 5.6 % Final    Comment:    (NOTE) Pre diabetes:          5.7%-6.4%  Diabetes:              >6.4%  Glycemic control for   <7.0% adults with diabetes   06/30/2019 11:32 PM 7.8 (H) 4.8 - 5.6 % Final    Comment:    (NOTE) Pre diabetes:          5.7%-6.4%  Diabetes:              >6.4%  Glycemic control for   <7.0% adults with diabetes     CBG: Recent Labs  Lab 06/25/23 0731 06/25/23 1129 06/25/23 1543 06/27/23 1127 06/28/23 0911  GLUCAP 141* 157* 103* 194* 161*    Review of Systems:    unAble to obtain  Past Medical History:  He,  has a past medical history of Acute deep vein thrombosis (DVT) of femoral vein of left lower extremity (HCC), Atrial fibrillation (HCC), CHF (congestive heart failure) (HCC), Chronic kidney disease, Diabetes (HCC), Hearing loss, High blood pressure, History of bladder problems, Hyperlipidemia, Kidney stones (11/22/2014), Liver cyst, OSA (obstructive sleep apnea), Prostate cancer (HCC), Prostate cancer (HCC), Stroke (HCC), and Valvular heart disease.   Surgical History:   Past Surgical History:  Procedure Laterality Date  CATARACT EXTRACTION     COLONOSCOPY WITH PROPOFOL  N/A 07/02/2019   Procedure: COLONOSCOPY WITH PROPOFOL ;  Surgeon: Selena Daily, MD;  Location: [facility] ENDOSCOPY;  Service: Gastroenterology;  Laterality: N/A;   ESOPHAGOGASTRODUODENOSCOPY (EGD) WITH PROPOFOL  N/A 03/19/2022   Procedure: ESOPHAGOGASTRODUODENOSCOPY (EGD) WITH PROPOFOL ;  Surgeon: Luke Salaam, MD;  Location: [facility] ENDOSCOPY;  Service: Gastroenterology;  Laterality: N/A;   IR CT HEAD LTD  08/27/2019   IR PERCUTANEOUS ART THROMBECTOMY/INFUSION INTRACRANIAL INC DIAG ANGIO  08/27/2019       IR PERCUTANEOUS ART THROMBECTOMY/INFUSION INTRACRANIAL INC DIAG ANGIO  08/27/2019   LEFT HEART CATH AND CORONARY ANGIOGRAPHY N/A 06/24/2023   Procedure: LEFT HEART CATH AND CORONARY ANGIOGRAPHY;  Surgeon: Percival Brace, MD;  Location: [facility] INVASIVE CV LAB;  Service: Cardiovascular;  Laterality: N/A;   LOWER EXTREMITY ANGIOGRAPHY Left 12/25/2020   Procedure: Lower Extremity Angiography;  Surgeon: Celso College, MD;  Location: [facility] INVASIVE CV LAB;  Service: Cardiovascular;  Laterality: Left;   PACEMAKER LEADLESS INSERTION N/A 12/19/2020   Procedure: PACEMAKER LEADLESS INSERTION;  Surgeon: Percival Brace, MD;  Location: [facility] INVASIVE CV LAB;  Service: Cardiovascular;  Laterality: N/A;   PROSTATE CANCER     PROSTATE SURGERY     RADIOLOGY WITH ANESTHESIA N/A 08/27/2019   Procedure:  IR WITH ANESTHESIA;  Surgeon: Luellen Sages, MD;  Location: [facility] OR;  Service: Radiology;  Laterality: N/A;     Social History:   reports that he has quit smoking. His smoking use included cigarettes. He has never used smokeless tobacco. He reports that he does not drink alcohol and does not use drugs.   Family History:  His family history includes Colon cancer in his mother; Diabetes in his brother and another family member; High blood pressure in an other family member; Prostate cancer in his brother.   Allergies No Known Allergies   Home Medications  Prior to Admission medications   Medication Sig Start Date End Date Taking? Authorizing Provider  albuterol  (VENTOLIN  HFA) 108 (90 Base) MCG/ACT inhaler Inhale 2 puffs into the lungs every 4 (four) hours as needed for wheezing or shortness of breath. 12/26/22   Laurence Pons, NP  amiodarone  (PACERONE ) 200 MG tablet Take 2 tablets (400 mg total) by mouth 2 (two) times daily for 5 days, THEN 2 tablets (400 mg total) daily for 5 days, THEN 1 tablet (200 mg total) daily for 5 days. 06/27/23 07/12/23  Brenna Cam, MD  aspirin  EC 81 MG tablet Take 1 tablet (81 mg total) by mouth daily. Swallow whole. 06/28/23   Brenna Cam, MD  atorvastatin  (LIPITOR) 10 MG tablet TAKE 1 TABLET(10 MG) BY MOUTH DAILY 12/26/22   Laurence Pons, NP  Cholecalciferol (VITAMIN D-3 PO) Take 1 capsule by mouth daily.    [provider]  dapagliflozin  propanediol (FARXIGA ) 10 MG TABS tablet Take 1 tablet (10 mg total) by mouth daily. 12/26/22   Laurence Pons, NP  edoxaban  (SAVAYSA ) 30 MG TABS tablet Take 30 mg by mouth daily. 11/01/21   [provider]  enzalutamide  (XTANDI ) 40 MG tablet Take 4 tablets (160 mg total) by mouth daily. 04/29/23   Timmy Forbes, MD  ferrous sulfate  (FEROSUL) 325 (65 FE) MG tablet Take 1 tablet by mouth daily. 02/02/21   [provider]  furosemide  (LASIX ) 20 MG tablet Take 1 tablet (20 mg total) by mouth daily. 12/26/22    Laurence Pons, NP  glucose blood (ONETOUCH VERIO) test strip USE ONE STRIP DAILY 01/27/23   Laurence Pons, NP  nitroGLYCERIN  (  NITROSTAT ) 0.4 MG SL tablet Place 1 tablet (0.4 mg total) under the tongue every 5 (five) minutes as needed for chest pain. 06/27/23   Brenna Cam, MD  pantoprazole  (PROTONIX ) 40 MG tablet Take 1 tablet (40 mg total) by mouth daily. 12/26/22 12/26/23  Abernathy, Alyssa, NP  polyethylene glycol powder (GLYCOLAX /MIRALAX ) 17 GM/SCOOP powder Take 17 g by mouth daily as needed for mild constipation. Mix as directed. 06/27/23   Brenna Cam, MD  Semaglutide ,0.25 or 0.5MG /DOS, (OZEMPIC , 0.25 OR 0.5 MG/DOSE,) 2 MG/3ML SOPN Inject 0.5 mg into the skin once a week. 05/17/23   Abernathy, Alyssa, NP  Tiotropium Bromide  Monohydrate (SPIRIVA  RESPIMAT) 2.5 MCG/ACT AERS Inhale 2 puffs into the lungs daily. 12/26/22   Laurence Pons, NP  verapamil  (CALAN -SR) 180 MG CR tablet Take 1 tablet (180 mg total) by mouth daily. 06/27/23 07/27/23  Shah, Vipul, MD  vitamin B-12 (CYANOCOBALAMIN ) 1000 MCG tablet Take 1 tablet (1,000 mcg total) by mouth daily. 12/08/20   Laurence Pons, NP     Critical care time: 82 m     Celene Coins MD. Hazle Lites. [place] Pager : [phone]  If no response to pager , please call [phone] until 7 pm After 7:00 pm call Elink  [phone]   06/28/2023

## 2023-06-28 NOTE — ED Triage Notes (Addendum)
 Patient coming from home. S&S started at 0845. LKW: 0800. He was flocced. 123/78. HR: 77. O2: 85% initially. Then put on Nixa and brought it up to 95% on 2L. He is on a medication that the neurologist thinks is possibly causing a head bleed. Right side facial drop and right sided weakness. Unable to speak to staff but alert and responsive to staff. IN CT

## 2023-06-28 NOTE — ED Notes (Signed)
 Patient transferred to short term hospital. Error in documentation. Patient to transfer to [facility].

## 2023-06-28 NOTE — ED Notes (Signed)
 E-signature pad not working at this time. Paper consent for transfer signed by family member at bedside

## 2023-06-28 NOTE — Consult Note (Signed)
 NEUROLOGY CONSULT NOTE   Date of service: June 28, 2023 Patient Name: [name] MRN:  [id number] DOB:  1933-07-15 Chief Complaint: "AMS" Requesting Provider: Bryson Carbine, MD  History of Present Illness  [name] is a 89 y.o. male with hx of Afib on edoxaban , CHF, Stroke (left MCA in 2021 s/p thrombectomy with no residual), diabetes, HTN, HLD, OSA on CPAP, hard of hearing, CKD IIIb, prostate Ca (on Xtandi  and leuprolide )  He was recently admitted 06/23/2023 with altered mental status noted to have monomorphic V. tach with blood pressure 70s over 60s, s/p cardioversion.  He had a left heart catheterization without intervention due to no obstructive CAD.  Ectopy improved with medication changes including amiodarone  and verapamil .  He was discharged 6/6 and was doing well at home.  Took his evening medications as normal including his edoxaban  as confirmed by daughter-in-law healthcare power of attorney [name].  Of note the patient's wife does have some dementia and therefore she relies on [name] to make medical decisions for the patient.  Of note on EMS arrival the patient was satting 85% and was therefore put on oxygen , does not normally use oxygen  at home.  His examination was consistent with a left MCA syndrome and remained stable for EMS and stable during the time of my evaluation at [facility]  LKW: 8 AM Modified rankin score: 0-Completely asymptomatic and back to baseline post- stroke IV Thrombolysis: No, edoxaban  EVT: Yes, transfer to [facility] Some delay due to needing to call multiple family members to obtain consent.  Emergency contact information has been updated to reflect that the patient's daughter-in-law is his power of attorney and should be the first point of contact  NIHSS components Score: Comment  1a Level of Conscious 0[]  1[x]  2[]  3[]      1b LOC Questions 0[]  1[]  2[x]       1c LOC Commands 0[]  1[]  2[x]       2 Best Gaze 0[]  1[x]  2[]       3 Visual 0[]  1[]   2[x]  3[]      4 Facial Palsy 0[]  1[]  2[x]  3[]      5a Motor Arm - left 0[x]  1[]  2[]  3[]  4[]  UN[]    5b Motor Arm - Right 0[]  1[]  2[]  3[x]  4[]  UN[]    6a Motor Leg - Left 0[]  1[]  2[]  3[x]  4[]  UN[]  Clearly does have more movement of the left leg than the right, but not antigravity  6b Motor Leg - Right 0[]  1[]  2[]  3[x]  4[]  UN[]    7 Limb Ataxia 0[]  1[]  2[]  UN[x]      8 Sensory 0[]  1[x]  2[]  UN[]      9 Best Language 0[]  1[]  2[]  3[x]      10 Dysarthria 0[]  1[]  2[x]  UN[]      11 Extinct. and Inattention 0[x]  1[]  2[]       TOTAL:       ROS  Unable to assess secondary to patient's mental status   Past History   Past Medical History:  Diagnosis Date   Acute deep vein thrombosis (DVT) of femoral vein of left lower extremity (HCC)    Atrial fibrillation (HCC)    CHF (congestive heart failure) (HCC)    Chronic kidney disease    Diabetes (HCC)    Hearing loss    High blood pressure    History of bladder problems    Hyperlipidemia    Kidney stones 11/22/2014   Liver cyst    OSA (obstructive sleep apnea)    Prostate cancer (HCC)  Prostate cancer (HCC)    Stroke [facility])    Valvular heart disease     Past Surgical History:  Procedure Laterality Date   CATARACT EXTRACTION     COLONOSCOPY WITH PROPOFOL  N/A 07/02/2019   Procedure: COLONOSCOPY WITH PROPOFOL ;  Surgeon: Selena Daily, MD;  Location: [facility] ENDOSCOPY;  Service: Gastroenterology;  Laterality: N/A;   ESOPHAGOGASTRODUODENOSCOPY (EGD) WITH PROPOFOL  N/A 03/19/2022   Procedure: ESOPHAGOGASTRODUODENOSCOPY (EGD) WITH PROPOFOL ;  Surgeon: Luke Salaam, MD;  Location: [facility] ENDOSCOPY;  Service: Gastroenterology;  Laterality: N/A;   IR CT HEAD LTD  08/27/2019   IR PERCUTANEOUS ART THROMBECTOMY/INFUSION INTRACRANIAL INC DIAG ANGIO  08/27/2019       IR PERCUTANEOUS ART THROMBECTOMY/INFUSION INTRACRANIAL INC DIAG ANGIO  08/27/2019   LEFT HEART CATH AND CORONARY ANGIOGRAPHY N/A 06/24/2023   Procedure: LEFT HEART CATH AND CORONARY ANGIOGRAPHY;  Surgeon:  Percival Brace, MD;  Location: [facility] INVASIVE CV LAB;  Service: Cardiovascular;  Laterality: N/A;   LOWER EXTREMITY ANGIOGRAPHY Left 12/25/2020   Procedure: Lower Extremity Angiography;  Surgeon: Celso College, MD;  Location: [facility] INVASIVE CV LAB;  Service: Cardiovascular;  Laterality: Left;   PACEMAKER LEADLESS INSERTION N/A 12/19/2020   Procedure: PACEMAKER LEADLESS INSERTION;  Surgeon: Percival Brace, MD;  Location: [facility] INVASIVE CV LAB;  Service: Cardiovascular;  Laterality: N/A;   PROSTATE CANCER     PROSTATE SURGERY     RADIOLOGY WITH ANESTHESIA N/A 08/27/2019   Procedure: IR WITH ANESTHESIA;  Surgeon: Luellen Sages, MD;  Location: [facility] OR;  Service: Radiology;  Laterality: N/A;    Family History: Family History  Problem Relation Age of Onset   Colon cancer Mother    Diabetes Other    High blood pressure Other    Prostate cancer Brother    Diabetes Brother     Social History  reports that he has quit smoking. His smoking use included cigarettes. He has never used smokeless tobacco. He reports that he does not drink alcohol and does not use drugs.  No Known Allergies  Medications  No current facility-administered medications for this encounter.  Current Outpatient Medications:    albuterol  (VENTOLIN  HFA) 108 (90 Base) MCG/ACT inhaler, Inhale 2 puffs into the lungs every 4 (four) hours as needed for wheezing or shortness of breath., Disp: 18 g, Rfl: 5   amiodarone  (PACERONE ) 200 MG tablet, Take 2 tablets (400 mg total) by mouth 2 (two) times daily for 5 days, THEN 2 tablets (400 mg total) daily for 5 days, THEN 1 tablet (200 mg total) daily for 5 days., Disp: 35 tablet, Rfl: 0   aspirin  EC 81 MG tablet, Take 1 tablet (81 mg total) by mouth daily. Swallow whole., Disp: 30 tablet, Rfl: 12   atorvastatin  (LIPITOR) 10 MG tablet, TAKE 1 TABLET(10 MG) BY MOUTH DAILY, Disp: 90 tablet, Rfl: 1   Cholecalciferol (VITAMIN D-3 PO), Take 1 capsule by mouth daily., Disp: , Rfl:     dapagliflozin  propanediol (FARXIGA ) 10 MG TABS tablet, Take 1 tablet (10 mg total) by mouth daily., Disp: 30 tablet, Rfl: 5   edoxaban  (SAVAYSA ) 30 MG TABS tablet, Take 30 mg by mouth daily., Disp: , Rfl:    enzalutamide  (XTANDI ) 40 MG tablet, Take 4 tablets (160 mg total) by mouth daily., Disp: 120 tablet, Rfl: 2   ferrous sulfate  (FEROSUL) 325 (65 FE) MG tablet, Take 1 tablet by mouth daily., Disp: , Rfl:    furosemide  (LASIX ) 20 MG tablet, Take 1 tablet (20 mg total) by mouth daily., Disp: 90  tablet, Rfl: 3   glucose blood (ONETOUCH VERIO) test strip, USE ONE STRIP DAILY, Disp: 100 strip, Rfl: 3   nitroGLYCERIN  (NITROSTAT ) 0.4 MG SL tablet, Place 1 tablet (0.4 mg total) under the tongue every 5 (five) minutes as needed for chest pain., Disp: 25 tablet, Rfl: 0   pantoprazole  (PROTONIX ) 40 MG tablet, Take 1 tablet (40 mg total) by mouth daily., Disp: 90 tablet, Rfl: 3   polyethylene glycol powder (GLYCOLAX /MIRALAX ) 17 GM/SCOOP powder, Take 17 g by mouth daily as needed for mild constipation. Mix as directed., Disp: 238 g, Rfl: 0   Semaglutide ,0.25 or 0.5MG /DOS, (OZEMPIC , 0.25 OR 0.5 MG/DOSE,) 2 MG/3ML SOPN, Inject 0.5 mg into the skin once a week., Disp: , Rfl:    Tiotropium Bromide  Monohydrate (SPIRIVA  RESPIMAT) 2.5 MCG/ACT AERS, Inhale 2 puffs into the lungs daily., Disp: 4 g, Rfl: 3   verapamil  (CALAN -SR) 180 MG CR tablet, Take 1 tablet (180 mg total) by mouth daily., Disp: 30 tablet, Rfl: 0   vitamin B-12 (CYANOCOBALAMIN ) 1000 MCG tablet, Take 1 tablet (1,000 mcg total) by mouth daily., Disp: 30 tablet, Rfl: 0  Vitals   Vitals:   06/28/23 0931 06/28/23 0936 06/28/23 0937 06/28/23 0945  BP:   (!) 142/101 126/80  Pulse:   (!) 118 88  Resp:   (!) 23 (!) 22  Temp:  98.6 F (37 C)    TempSrc:  Axillary    SpO2:   93% 94%  Weight: 109.9 kg     Height:        Body mass index is 32.85 kg/m.   Physical Exam   Constitutional: Appears well-developed and well-nourished.  Psych: Affect  appropriate to situation.  Eyes: No scleral injection.  HENT: No OP obstruction.  Head: Normocephalic.  Cardiovascular: Intermittent ectopy on the monitor Respiratory: Somewhat short of breath lying flat, improved with mild elevation of head of bed GI: Soft.  No distension. There is no tenderness.  Skin: WDI.   Neurologic Examination    Mental Status: Patient is slightly drowsy but awakens, tracks examiner to midline.  Does not follow any commands.  At best moans with no other verbal output. Cranial Nerves: II: Visual Fields are hemianopia on the right to blink to threat. III,IV, VI: Left gaze deviation.  With encouragement will track examiner to midline VII: Facial movement is notable for a right facial droop.  VIII: hearing is intact to voice Motor: Able to maintain left upper extremity antigravity for full 10 seconds.  Has some movement of the left leg but will not move it antigravity.  Withdrawal versus flexion in the right arm and leg Sensory: Less responsive to noxious stimulation on the right than the left Cerebellar: Unable to assess secondary to patient's mental status  Gait:  Deferred in acute setting    Labs/Imaging/Neurodiagnostic studies   CBC:  Recent Labs  Lab 07-23-23 1857 06/24/23 0329 06/25/23 0355 06/26/23 0405  WBC 5.4   < > 5.5 5.6  NEUTROABS 2.4  --   --   --   HGB 14.0   < > 14.3 14.4  HCT 43.2   < > 43.6 44.5  MCV 96.0   < > 94.4 94.9  PLT 166   < > 152 131*   < > = values in this interval not displayed.   Basic Metabolic Panel:  Lab Results  Component Value Date   NA 134 (L) 06/27/2023   K 4.4 06/27/2023   CO2 23 06/27/2023   GLUCOSE 144 (  H) 06/27/2023   BUN 30 (H) 06/27/2023   CREATININE 1.68 (H) 06/27/2023   CALCIUM  8.7 (L) 06/27/2023   GFRNONAA 39 (L) 06/27/2023   GFRAA 28 (L) 10/12/2019   Lipid Panel:  Lab Results  Component Value Date   LDLCALC 27 08/28/2019   HgbA1c:  Lab Results  Component Value Date   HGBA1C 8.7 (A)  03/10/2023   Urine Drug Screen:     Component Value Date/Time   LABOPIA NEGATIVE 06/11/2013 2253   COCAINSCRNUR NEGATIVE 06/11/2013 2253   LABBENZ NEGATIVE 06/11/2013 2253   AMPHETMU NEGATIVE 06/11/2013 2253   THCU NEGATIVE 06/11/2013 2253   LABBARB NEGATIVE 06/11/2013 2253    Alcohol Level     Component Value Date/Time   ETH <10 08/27/2019 1913   INR  Lab Results  Component Value Date   INR 1.3 (H) 09/09/2022   APTT  Lab Results  Component Value Date   APTT 106 (H) 06/24/2023   CT Head without contrast(Personally reviewed): Aspects 7 versus early cytotoxic edema Potential hyperdense vessel sign  CT angio Head and Neck with contrast(Personally reviewed): Left M1 occlusion   ASSESSMENT   [name] is a 89 y.o. male with multiple vascular respecters as detailed above coming in with recurrent left M1 occlusion  RECOMMENDATIONS   # Left M1 occlusion - No TNK due to edoxaban  last dose last night - Transfer to [facility] for emergent thrombectomy  # Hypoxia on 2 L - Chest x-ray once stabilized  # Recent history of monomorphic V. Tach - Telemetry, consider cardiology consultation  Discussed with Dr. Alvira Josephs, Dr. Doretta Gant, and ED provider  ______________________________________________________________________   Baldwin Levee MD-PhD [facility] [phone] [facility] coverage for [facility] is from 8 AM to 4 AM in-house and 4 PM to 8 PM by telephone/video. 8 PM to 8 AM emergent questions or overnight urgent questions should be addressed to Teleneurology On-call or Arlin Benes neurohospitalist; contact information can be found on AMION  CRITICAL CARE Performed by: Ronnette Coke   Total critical care time: 60 minutes  Critical care time was exclusive of separately billable procedures and treating other patients.  Critical care was necessary to treat or prevent imminent or life-threatening deterioration.  Critical care was time spent  personally by me on the following activities: development of treatment plan with patient and/or surrogate as well as nursing, discussions with consultants, evaluation of patient's response to treatment, examination of patient, obtaining history from patient or surrogate, ordering and performing treatments and interventions, ordering and review of laboratory studies, ordering and review of radiographic studies, pulse oximetry and re-evaluation of patient's condition.

## 2023-06-28 NOTE — ED Notes (Addendum)
 Visual merchandiser, [employer] interrogated.

## 2023-06-28 NOTE — ED Notes (Signed)
 Unable to reach IR nurse on phone number to report.

## 2023-06-28 NOTE — ED Notes (Signed)
 Patient placed on 2L [place] due to oxygen  saturation dropping.

## 2023-06-28 NOTE — Progress Notes (Signed)
 RT note. Patient transported from PACU to 4 N on ventilator without any complications.   06/28/23 1654  Vent Select  $ Transport Ventilator  Yes  Invasive or Noninvasive Invasive  Adult Vent Y  Airway 7 mm  Placement Date/Time: 06/28/23 (c) 1050   Grade View: Grade 1  Laryngoscope Blade: 3  Size (mm): 7 mm  Cuffed: Cuffed  Insertion attempts: 1  Airway Equipment: Video Laryngoscope  Placement Confirmation: ETCO2 (Capnography);Bilateral Breath Sounds;Dire...  Secured at (cm) (S)  25 cm  Measured From Lips  Secured Location Right  Secured By English as a second language teacher No  Tube Holder Repositioned Yes  Cuff Pressure (cm H2O) Clear OR 27-39 CmH2O  Site Condition Dry  Adult Ventilator Settings  Vent Type Servo i  Humidity HME  Vent Mode PRVC  Vt Set 620 mL  Set Rate 16 bmp  FiO2 (%) 40 %  I Time 0.8 Sec(s)  PEEP 5 cmH20  Adult Ventilator Measurements  Peak Airway Pressure 35 L/min  Mean Airway Pressure 11 cmH20  Resp Rate Spontaneous 0 br/min  Resp Rate Total 16 br/min  Exhaled Vt 635 mL  Measured Ve 9.9 L  I:E Ratio Measured 1:3.7  Auto PEEP 0 cmH20  Total PEEP 5 cmH20  SpO2 100 %  Adult Ventilator Alarms  Alarms On Y  Ve High Alarm 20 L/min  Ve Low Alarm 5 L/min  Resp Rate High Alarm 40 br/min  Resp Rate Low Alarm 10  PEEP Low Alarm 2 cmH2O  Press High Alarm 40 cmH2O  T Apnea 20 sec(s)  Daily Weaning Assessment  Daily Assessment of Readiness to Wean Wean protocol criteria not met  Reason not met (S)   (Just suctioned)  Breath Sounds  Bilateral Breath Sounds Clear;Diminished  Suction Method  Respiratory Interventions Airway suction  Airway Suctioning/Secretions  Suction Type ETT  Suction Device  Catheter  Secretion Amount Small  Secretion Color Clear  Secretion Consistency Thin  Suction Tolerance Tolerated well  Suctioning Adverse Effects None

## 2023-06-28 NOTE — Anesthesia Preprocedure Evaluation (Signed)
 Anesthesia Evaluation  Patient identified by MRN, date of birth, ID band Patient confused    Reviewed: Allergy & Precautions, H&P , Unable to perform ROS - Chart review onlyPreop documentation limited or incomplete due to emergent nature of procedure.  Airway Mallampati: II  TM Distance: >3 FB Neck ROM: Full    Dental no notable dental hx.    Pulmonary sleep apnea , COPD, former smoker   Pulmonary exam normal breath sounds clear to auscultation       Cardiovascular hypertension, + Past MI, + Peripheral Vascular Disease and +CHF  Normal cardiovascular exam+ dysrhythmias Atrial Fibrillation + pacemaker  Rhythm:Regular Rate:Normal  EF 45 %  SSS s/p micra  LHC 6/3 with non obstructive disease   Afib   AAA   Neuro/Psych CVA  negative psych ROS   GI/Hepatic negative GI ROS, Neg liver ROS,,,  Endo/Other  negative endocrine ROSdiabetes, Type 2    Renal/GU Renal disease  negative genitourinary   Musculoskeletal  (+) Arthritis ,    Abdominal   Peds negative pediatric ROS (+)  Hematology  (+) Blood dyscrasia, anemia   Anesthesia Other Findings Code stroke  Reproductive/Obstetrics negative OB ROS                              Anesthesia Physical Anesthesia Plan  ASA: 4 and emergent  Anesthesia Plan: General   Post-op Pain Management:    Induction: Intravenous and Rapid sequence  PONV Risk Score and Plan: 3 and Ondansetron  and Treatment may vary due to age or medical condition  Airway Management Planned: Oral ETT and Video Laryngoscope Planned  Additional Equipment:   Intra-op Plan:   Post-operative Plan: Extubation in OR  Informed Consent: I have reviewed the patients History and Physical, chart, labs and discussed the procedure including the risks, benefits and alternatives for the proposed anesthesia with the patient or authorized representative who has indicated his/her  understanding and acceptance.     Dental advisory given  Plan Discussed with: CRNA  Anesthesia Plan Comments: [name] is a 89 y.o. male with hx of Afib on edoxaban , CHF, Stroke (left MCA in 2021 s/p thrombectomy with no residual), diabetes, HTN, HLD, OSA on CPAP, hard of hearing, CKD IIIb, prostate Ca (on Xtandi  and leuprolide )   He was recently admitted 06/23/2023 with altered mental status noted to have monomorphic V. tach with blood pressure 70s over 60s, s/p cardioversion.  He had a left heart catheterization without intervention due to no obstructive CAD.  Ectopy improved with medication changes including amiodarone  and verapamil .  He was discharged 6/6 and was doing well at home.  Took his evening medications as normal including his edoxaban  as confirmed by daughter-in-law healthcare power of attorney [name].  Of note the patient's wife does have some dementia and therefore she relies on [name] to make medical decisions for the patient.   Of note on EMS arrival the patient was satting 85% and was therefore put on oxygen , does not normally use oxygen  at home.   His examination was consistent with a left MCA syndrome and remained stable for EMS and stable during the time of my evaluation at [facility] ")         Anesthesia Quick Evaluation

## 2023-06-28 NOTE — Progress Notes (Signed)
 Telestroke Note  0908: Code stroke cart activated at this time by nursing for EMS pre-alert.   [phone]: Dr.Bhagat [facility] paged. Patient presents via EMS. Dr.Betty and Dr.Bhagat arrived. Per EMS reports, patient woke up this morning at baseline. LKW 0800. Patient experienced sudden onset right sided weakness. Per EMS, right arm and leg are flaccid. Patient also noted to be aphasic. mRS 3 per PT notes on 6/6.   0910: Patient transported to CT. Dr.Bhagat assessing patient en route to CT.   0915: Called patients wife, Mrs. Bertino to confirm last dose of Edoxaban . Patients wife unable to confirm last dose of medication. Updated Dr.Bhagat at this time.   9604: Dr.Bhagat on the phone speaking with Mrs. Nazar, patients wife.   5409: Advanced imaging complete. NIHSS exam started by nursing.    8119: Patient returned from CT.   1478: Dr.Bhagat at patients bedside speaking with patients HPOA.   2956: NIHSS exam started by Dr.Bhagat.   2130: NIHSS exam completed by Dr.Bhagat.   8657: Patient to transfer to [facility]. No further needs from telestroke nurse per Dr.Bhagat. Logged off stroke cart  at this time.    Belvie Boyers Telestroke RN

## 2023-06-28 NOTE — Discharge Summary (Signed)
 Physician Discharge Summary   Patient: [name] MRN: [id number] DOB: 02/14/1933  Admit date:     06/23/2023  Discharge date: 06/27/2023  Discharge Physician: Brenna Cam   PCP: Laurence Pons, NP   Recommendations at discharge:    F/up with outpt providers as requested  Discharge Diagnoses: Principal Problem:   Monomorphic ventricular tachycardia (HCC) Active Problems:   Acute on chronic heart failure with preserved ejection fraction (HFpEF) (HCC)   NSTEMI (non-ST elevated myocardial infarction) (HCC)   Hypothermia   CKD (chronic kidney disease), stage IV (HCC)   Diabetes mellitus (HCC)   OSA (obstructive sleep apnea)   COPD (chronic obstructive pulmonary disease) (HCC)   Chronic a-fib (HCC)   Prostate cancer (HCC)   Altered mental status  Hospital Course: Assessment and Plan:  89 y.o. male with medical history significant of permanent atrial fibrillation on edoxaban , sick sinus syndrome s/p PPM, HFpEF with last EF of 55-60%, CKD stage IIIb, hypertension, hyperlipidemia, type 2 diabetes, stage IVb metastatic prostate cancer on Xtandi  and leuprolide , DVT who presents to the ED due to altered mental status. Upon arriving the hospital, patient was hypotensive of 70/60 with heart rate 180.  EKG showed monomorphic ventricular tachycardia which was converted emergently in the emergency room.  Peak troponin elevated to 1100. Patient was placed on heparin  drip, aspirin , Lipitor.  Seen by cardiology, scheduled for heart cath 6/4.    * Monomorphic ventricular tachycardia  Patient reports with AMS and found to be in monomorphic VT with HR in 180s in ED. Patient underwent cardioversion. EKG in ED s/p cardioversion at 2211 with ventricular paced rhythm, rate 80s. BNP 490. Trops elevated and trending 16 > 170 > 920 > 1140>1150>920 .  - Echo this admission with newly mildly reduced EF (45-50%) with anterolateral, posterior wall  and basal inferior segment are hypokinetic, mod LVH (new  abnormalities compared to prior echo). Echo from 06/2023 with pEF (55-60%) and no RWMA.  - Underwent LHC with Dr. Parks Bollman (06/03) and revealed 40% stenosed Ost/prox LAD, mild LV systolic dysfunction. Right groin incision site is clean and dry with no evidence of significant swelling, bruising, or active bleeding.  - Hgb stable and Cr improved this morning after catheterization. EKG on 06/04 with left fascicular VT. There is significant improvement in NSVT. Per tele with occasional PVCs. -Continue ASA 81 mg, atorvastatin  10 mg daily -Continue home edoxaban  30 mg daily for stroke risk reduction. -Discontinue metoprolol  tartarte per EP to allow uptitration of verampil.  -Continue PO amio 400 BID for 5 days, 400 mg daily for 5 days, then 200 mg daily.  -Consolidated verampil to 180 mg daily. -Consider resuming home GDMT dapagliflozin  at outpatient follow-up if renal function allows. -EP consulted. Appreciate recommendations.  Recommend outpatient follow-up with EP for possible ablation. -Encouraged patient to get out of bed. - Cardio cleared him for D/C with outpt follow up in clinic with Dr. Beau Bound in 1-2 weeks.  Follow-up appointment with Dr. Devore (heart failure) on 06/17 at 11AM.    NSTEMI, ruled out Trop elevation 2/2 demand ischemia --LHC on 6/3 showed insignificant CAD   Chest pain, atypical --multiple episodes of chest pain reported to be positional, did not seem cardiac.   Acute on chronic combined systolic and diastolic congestive heart failure. --current Echo with LVEF 45-50%    Acute metabolic encephalopathy secondary to ventricular tachycardia Hypotension secondary to ventricular tachycardia --both resolved   Perm Afib --cont home edoxaban    Hypothermia Patient was noted to be hypothermic  on presentation, with improvement in temperature post cardioversion.  Cortisol level elevated.   Prostate cancer [facility]) Patient has a history of stage IVb prostate cancer with  regional lymphadenopathy, on Xtandi  and leuprolide . - Continue home regimen   COPD (chronic obstructive pulmonary disease) (HCC) Chronic hypoxemic respiratory failure on 2L O2 No exacerbation.   OSA (obstructive sleep apnea) - cont CPAP nightly   Diabetes mellitus (HCC) - Hold home Ozempic  and Farxiga  --d/c'ed BG checks, no need   CKD 3b Not CKD 4 --Cr stable at baseline       Consultants: Cardio Procedures performed: Cath  Disposition: Home health HHPT, OT, Rolling walker and Palliative care  Diet recommendation:  Discharge Diet Orders (From admission, onward)     Start     Ordered   06/27/23 0000  Diet - low sodium heart healthy        06/27/23 1126           Carb modified diet DISCHARGE MEDICATION: Allergies as of 06/27/2023   No Known Allergies      Medication List     STOP taking these medications    Metoprolol  Tartrate 37.5 MG Tabs       TAKE these medications    albuterol  108 (90 Base) MCG/ACT inhaler Commonly known as: VENTOLIN  HFA Inhale 2 puffs into the lungs every 4 (four) hours as needed for wheezing or shortness of breath.   amiodarone  200 MG tablet Commonly known as: PACERONE  Take 2 tablets (400 mg total) by mouth 2 (two) times daily for 5 days, THEN 2 tablets (400 mg total) daily for 5 days, THEN 1 tablet (200 mg total) daily for 5 days. Start taking on: June 27, 2023   aspirin  EC 81 MG tablet Take 1 tablet (81 mg total) by mouth daily. Swallow whole.   atorvastatin  10 MG tablet Commonly known as: LIPITOR TAKE 1 TABLET(10 MG) BY MOUTH DAILY   cyanocobalamin  1000 MCG tablet Commonly known as: VITAMIN B12 Take 1 tablet (1,000 mcg total) by mouth daily.   dapagliflozin  propanediol 10 MG Tabs tablet Commonly known as: FARXIGA  Take 1 tablet (10 mg total) by mouth daily.   edoxaban  30 MG Tabs tablet Commonly known as: SAVAYSA  Take 30 mg by mouth daily.   FeroSul 325 (65 FE) MG tablet Generic drug: ferrous sulfate  Take 1  tablet by mouth daily.   furosemide  20 MG tablet Commonly known as: LASIX  Take 1 tablet (20 mg total) by mouth daily.   nitroGLYCERIN  0.4 MG SL tablet Commonly known as: NITROSTAT  Place 1 tablet (0.4 mg total) under the tongue every 5 (five) minutes as needed for chest pain.   OneTouch Verio test strip Generic drug: glucose blood USE ONE STRIP DAILY   Ozempic  (0.25 or 0.5 MG/DOSE) 2 MG/3ML Sopn Generic drug: Semaglutide (0.25 or 0.5MG /DOS) Inject 0.5 mg into the skin once a week.   pantoprazole  40 MG tablet Commonly known as: Protonix  Take 1 tablet (40 mg total) by mouth daily.   polyethylene glycol powder 17 GM/SCOOP powder Commonly known as: GLYCOLAX /MIRALAX  Take 17 g by mouth daily as needed for mild constipation. Mix as directed.   Spiriva  Respimat 2.5 MCG/ACT Aers Generic drug: Tiotropium Bromide  Monohydrate Inhale 2 puffs into the lungs daily.   verapamil  180 MG CR tablet Commonly known as: CALAN -SR Take 1 tablet (180 mg total) by mouth daily.   VITAMIN D-3 PO Take 1 capsule by mouth daily.   Xtandi  40 MG tablet Generic drug: enzalutamide  Take 4 tablets (160 mg  total) by mouth daily.        Follow-up Information     Antonette Batters, MD. Go in 1 week(s).   Specialties: Cardiology, Internal Medicine Contact information: [address] [place] [address] [phone]         Dortha Gauss, MD. Go in 2 week(s).   Specialty: Cardiology Why: Appointment scheduled for heart failure clinic on 07/08/2023 at 11 AM with Dr. Lavonna Prader information: [address] MEDICINE [place] [address] [phone]         Laurence Pons, NP. Schedule an appointment as soon as possible for a visit in 1 week(s).   Specialty: Nurse Practitioner Why: [facility] Discharge F/UP Contact information: [address] [place] [address] [phone]                Discharge Exam: Filed Weights   06/25/23 0543 06/26/23 0500 06/27/23  0437  Weight: 106 kg 106.8 kg 107.3 kg   Constitutional: NAD, alert, oriented to person and place HEENT: conjunctivae and lids normal, EOMI CV: No cyanosis.   RESP: normal respiratory effort Extremities: edema in BLE SKIN: warm, dry Neuro: II - XII grossly intact.    Condition at discharge: fair  The results of significant diagnostics from this hospitalization (including imaging, microbiology, ancillary and laboratory) are listed below for reference.   Imaging Studies: Portable Chest x-ray Result Date: 06/28/2023 CLINICAL DATA:  Intubation. EXAM: PORTABLE CHEST 1 VIEW COMPARISON:  Chest radiograph dated 06/23/2023. FINDINGS: Endotracheal tube approximately 4.5 cm above the carina. There is cardiomegaly with vascular congestion. Bibasilar atelectasis. Small right pleural effusion. No pneumothorax. No acute osseous pathology. IMPRESSION: 1. Endotracheal tube above the carina. 2. Cardiomegaly with vascular congestion.  Bibasilar atelectasis. Electronically Signed   By: Angus Bark M.D.   On: 06/28/2023 14:08   CT ANGIO HEAD NECK W WO CM (CODE STROKE) Result Date: 06/28/2023 CLINICAL DATA:  89 year old male code stroke. 2021 left M1 occlusion treated with thrombectomy. Questionable recurrent left MCA cytotoxic edema on plain head CT today. EXAM: CT ANGIOGRAPHY HEAD AND NECK TECHNIQUE: Multidetector CT imaging of the head and neck was performed using the standard protocol during bolus administration of intravenous contrast. Multiplanar CT image reconstructions and MIPs were obtained to evaluate the vascular anatomy. Carotid stenosis measurements (when applicable) are obtained utilizing NASCET criteria, using the distal internal carotid diameter as the denominator. RADIATION DOSE REDUCTION: This exam was performed according to the departmental dose-optimization program which includes automated exposure control, adjustment of the mA and/or kV according to patient size and/or use of iterative  reconstruction technique. CONTRAST:  Not specified. COMPARISON:  Head CT at 0917 hours.  Prior CTA 08/27/2019. FINDINGS: CTA NECK Skeleton: Cervical spine degeneration. No acute osseous abnormality identified. Upper chest: Small layering pleural effusions. Pulmonary arterial dominant contrast timing in the mediastinum. Other neck: Nonvascular neck soft tissue spaces are within normal limits. Aortic arch: Calcified aortic atherosclerosis. Bovine arch configuration. Right carotid system: Tortuous brachiocephalic artery and proximal right CCA. Patent to the skull base with minimal atherosclerosis. Left carotid system: Bovine left CCA origin with minimal plaque and no stenosis. Soft and calcified plaque at the left ICA origin but patent left ICA to the skull base with no significant stenosis. Vertebral arteries: Chronically occluded proximal right vertebral artery. Reconstitution in the V2 segment seen in 2021 is not apparent although contrast timing is earlier today. Proximal left subclavian artery atherosclerosis without stenosis. Calcified plaque at the left vertebral artery origin with moderate stenosis, remains  patent. Left vertebral artery caliber stable since 2021, the vessel is patent to the skull base without additional stenosis. CTA HEAD Posterior circulation: Patent left V4 segment with mild atherosclerosis, no significant stenosis to the vertebrobasilar junction. Faint probably retrograde enhancement of chronically diminutive right V4 segment. Left PICA origin remains patent. Patent basilar artery without stenosis. Patent SCA and PCA origins. Posterior communicating arteries are diminutive or absent. Bilateral PCA branches are stable and within normal limits. Anterior circulation: Suboptimal arterial contrast timing. Both ICA siphons are patent. Right greater than left ICA siphon calcified atherosclerosis. Mild to moderate right ICA anterior genu and supraclinoid stenosis. No significant left ICA siphon  stenosis. Patent carotid termini. Patent MCA and ACA origins. A1 segments are stable, the left is mildly dominant and the right is mildly irregular. ACA branch early contrast timing, stable since 2021. Right MCA M1 segment, right MCA bifurcation are patent. Right MCA branches appear stable since 2021. Recurrent left MCA M1 occlusion 5 mm distal to its origin on series 10 image 12 and series 9, image 18 with no reconstitution on these early phase images. Venous sinuses: Early contrast timing, not evaluated. Anatomic variants: Effectively dominant left vertebral artery, bovine arch. Review of the MIP images confirms the above findings Preliminary report of left MCA M1 ELVO was communicated to Dr. Cleone Dad at 9:32 am on 06/28/2023 by text page via the [employer] messaging system. IMPRESSION: 1. Positive for Recurrent Left MCA M1 Occlusion (5 mm distal to origin). This was communicated to Dr. Cleone Dad at 9:32 am on 06/28/2023 by text page via the [facility] messaging system. 2. Otherwise largely stable CTA since 2021 when allowing for early arterial contrast timing today. Chronically occluded right vertebral artery. Moderate Left Vertebral Artery origin stenosis. Aortic Atherosclerosis (ICD10-I70.0). Electronically Signed   By: Marlise Simpers M.D.   On: 06/28/2023 09:38   CT HEAD CODE STROKE WO CONTRAST Result Date: 06/28/2023 CLINICAL DATA:  Code stroke. 89 year old male. History of 2021 left MCA M1 occlusion treated with thrombectomy. EXAM: CT HEAD WITHOUT CONTRAST TECHNIQUE: Contiguous axial images were obtained from the base of the skull through the vertex without intravenous contrast. RADIATION DOSE REDUCTION: This exam was performed according to the departmental dose-optimization program which includes automated exposure control, adjustment of the mA and/or kV according to patient size and/or use of iterative reconstruction technique. COMPARISON:  Brain MRI 08/28/2019.  Head CT 08/27/2019. FINDINGS: Brain: Chronic dense globus pallidus  calcifications. Chronic posterior left basal ganglia ischemia with encephalomalacia. Less motion artifact than on the 2021 head CT. Right hemisphere and posterior fossa gray-white differentiation appears stable since 2021. But on series 3 image 21 there is possible acute cytotoxic edema in the left MCA Tara 4 toward Territory spanning the M4 through M6 segments. No intracranial hemorrhage or mass effect. No ventriculomegaly. Vascular: Calcified atherosclerosis at the skull base. No suspicious intracranial vascular hyperdensity. Skull: Stable and intact.  No acute osseous abnormality identified. Sinuses/Orbits: Visualized paranasal sinuses and mastoids are stable and well aerated. Other: Leftward gaze. Chronic postoperative changes to both globes. Visualized scalp soft tissues are within normal limits. ASPECTS [facility] Stroke Program Early CT Score) Total score (0-10 with 10 being normal): 7 (abnormal M4, M5, and M6) IMPRESSION: 1. Possible Acute cytotoxic edema Left MCA territory, ASPECTS 7. 2. No acute intracranial hemorrhage or mass effect. 3. The above was communicated to Dr. Cleone Dad at 9:29 am on 06/28/2023 by text page via the [facility] messaging system. 4. Chronic left basal ganglia infarct., globus pallidus calcifications, Calcified  atherosclerosis at the skull base. Electronically Signed   By: Marlise Simpers M.D.   On: 06/28/2023 09:30   CARDIAC CATHETERIZATION Result Date: 06/24/2023   Ost LAD to Prox LAD lesion is 40% stenosed.   There is mild left ventricular systolic dysfunction.   The left ventricular ejection fraction is 35-45% by visual estimate. 1.  Insignificant coronary artery disease with 40-50% ostial/proximal LAD 2.  Mild reduced left ventricular function with estimate LVEF 40% Recommendations 1.  Medical therapy 2.  Aggressive risk factor modification   ECHOCARDIOGRAM COMPLETE Result Date: 06/24/2023    ECHOCARDIOGRAM REPORT   Patient Name:   JUNIPER SNYDERS Date of Exam: 06/24/2023 Medical Rec #:   [id number]         Height:       72.0 in Accession #:    [id number]        Weight:       234.6 lb Date of Birth:  [birth year]          BSA:          2.280 m Patient Age:    89 years          BP:           136/117 mmHg Patient Gender: M                 HR:           70 bpm. Exam Location:  [facility] Procedure: 2D Echo, Cardiac Doppler and Color Doppler (Both Spectral and Color            Flow Doppler were utilized during procedure). Indications:     Ventricular tachycardia  History:         Patient has prior history of Echocardiogram examinations, most                  recent 07/09/2022. CHF, Stroke, Arrythmias:Atrial Fibrillation;                  Risk Factors:Sleep Apnea.  Sonographer:     Broadus Canes Referring Phys:  [id number] Avi Body Diagnosing Phys: Lida Reeks Alluri IMPRESSIONS  1. Technically difficult study.  2. Left ventricular ejection fraction, by estimation, is 45 to 50%. The left ventricle has mildly decreased function. The left ventricle demonstrates regional wall motion abnormalities (see scoring diagram/findings for description). There is moderate left ventricular hypertrophy. Left ventricular diastolic parameters are indeterminate.  3. Right ventricular systolic function was not well visualized. The right ventricular size is not well visualized.  4. Left atrial size was moderately dilated.  5. The mitral valve is normal in structure. Trivial mitral valve regurgitation.  6. The aortic valve is tricuspid. Aortic valve regurgitation is not visualized. FINDINGS  Left Ventricle: Left ventricular ejection fraction, by estimation, is 45 to 50%. The left ventricle has mildly decreased function. The left ventricle demonstrates regional wall motion abnormalities. The left ventricular internal cavity size was normal in size. There is moderate left ventricular hypertrophy. Left ventricular diastolic parameters are indeterminate.  LV Wall Scoring: The antero-lateral wall, posterior wall, and basal inferior segment are  hypokinetic. The entire anterior wall, entire septum, entire apex, and mid and distal inferior wall are normal. Right Ventricle: The right ventricular size is not well visualized. Right vetricular wall thickness was not well visualized. Right ventricular systolic function was not well visualized. Left Atrium: Left atrial size was moderately dilated. Right Atrium: Right atrial size was not well visualized. Pericardium: There is no evidence of  pericardial effusion. Mitral Valve: The mitral valve is normal in structure. Trivial mitral valve regurgitation. Tricuspid Valve: The tricuspid valve is not well visualized. Tricuspid valve regurgitation is not demonstrated. Aortic Valve: The aortic valve is tricuspid. Aortic valve regurgitation is not visualized. Aortic valve mean gradient measures 1.5 mmHg. Aortic valve peak gradient measures 2.2 mmHg. Aortic valve area, by VTI measures 2.85 cm. Pulmonic Valve: The pulmonic valve was not well visualized. Pulmonic valve regurgitation is not visualized. Aorta: The aortic root is normal in size and structure. IAS/Shunts: The interatrial septum was not well visualized.  LEFT VENTRICLE PLAX 2D LVIDd:         4.20 cm     Diastology LVIDs:         3.60 cm     LV e' medial:    6.04 cm/s LV PW:         1.30 cm     LV E/e' medial:  13.8 LV IVS:        1.40 cm     LV e' lateral:   9.25 cm/s LVOT diam:     2.20 cm     LV E/e' lateral: 9.0 LV SV:         34 LV SV Index:   15 LVOT Area:     3.80 cm  LV Volumes (MOD) LV vol d, MOD A2C: 73.2 ml LV vol d, MOD A4C: 78.5 ml LV vol s, MOD A2C: 36.4 ml LV vol s, MOD A4C: 38.0 ml LV SV MOD A2C:     36.8 ml LV SV MOD A4C:     78.5 ml LV SV MOD BP:      38.5 ml RIGHT VENTRICLE RV Basal diam:  3.90 cm RV Mid diam:    3.50 cm LEFT ATRIUM              Index        RIGHT ATRIUM           Index LA diam:        6.40 cm  2.81 cm/m   RA Area:     28.40 cm LA Vol (A2C):   117.0 ml 51.32 ml/m  RA Volume:   85.80 ml  37.63 ml/m LA Vol (A4C):   69.5 ml   30.48 ml/m LA Biplane Vol: 94.8 ml  41.58 ml/m  AORTIC VALVE AV Area (Vmax):    2.24 cm AV Area (Vmean):   2.20 cm AV Area (VTI):     2.85 cm AV Vmax:           74.60 cm/s AV Vmean:          48.650 cm/s AV VTI:            0.118 m AV Peak Grad:      2.2 mmHg AV Mean Grad:      1.5 mmHg LVOT Vmax:         43.90 cm/s LVOT Vmean:        28.200 cm/s LVOT VTI:          0.089 m LVOT/AV VTI ratio: 0.75  AORTA Ao Root diam: 3.70 cm MITRAL VALVE               TRICUSPID VALVE MV Area (PHT): 5.34 cm    TR Peak grad:   9.5 mmHg MV Decel Time: 142 msec    TR Vmax:        154.00 cm/s MV E velocity: 83.30 cm/s  SHUNTS                            Systemic VTI:  0.09 m                            Systemic Diam: 2.20 cm Joetta Mustache Electronically signed by Joetta Mustache Signature Date/Time: 06/24/2023/11:49:00 AM    Final    DG Chest Portable 1 View Result Date: 06/23/2023 CLINICAL DATA:  vtach, cardioverted EXAM: PORTABLE CHEST 1 VIEW COMPARISON:  Chest radiograph 09/21/2022 FINDINGS: Lung volumes remain low. Cardiomegaly is stable. Mediastinal contours are unchanged. Mild vascular congestion. No focal airspace disease. No pneumothorax or large pleural effusion. On limited assessment, no acute osseous findings. IMPRESSION: Low lung volumes with cardiomegaly and mild vascular congestion. Electronically Signed   By: Chadwick Colonel M.D.   On: 06/23/2023 20:04    Microbiology: Results for orders placed or performed during the hospital encounter of 06/23/23  MRSA Next Gen by PCR, Nasal     Status: None   Collection Time: 06/23/23 10:12 PM   Specimen: Nasal Mucosa; Nasal Swab  Result Value Ref Range Status   MRSA by PCR Next Gen NOT DETECTED NOT DETECTED Final    Comment: (NOTE) The GeneXpert MRSA Assay (FDA approved for NASAL specimens only), is one component of a comprehensive MRSA colonization surveillance program. It is not intended to diagnose MRSA infection nor to guide or monitor  treatment for MRSA infections. Test performance is not FDA approved in patients less than 50 years old. Performed at [facility], [address] [place], [place] [address]     Labs: CBC: Recent Labs  Lab 06/23/23 1857 06/24/23 0329 06/25/23 0355 06/26/23 0405 06/28/23 0913 06/28/23 1417  WBC 5.4 4.9 5.5 5.6 8.1  --   NEUTROABS 2.4  --   --   --  5.4  --   HGB 14.0 14.7 14.3 14.4 13.1 12.2*  HCT 43.2 45.7 43.6 44.5 40.2 36.0*  MCV 96.0 96.0 94.4 94.9 94.6  --   PLT 166 154 152 131* 148*  --    Basic Metabolic Panel: Recent Labs  Lab 06/23/23 1857 06/24/23 0329 06/25/23 0355 06/26/23 0405 06/27/23 0620 06/28/23 0913 06/28/23 1417  NA 140 142 138 136 134* 131* 133*  K 3.7 3.7 3.8 4.6 4.4 4.6 4.3  CL 110 106 105 104 104 99  --   CO2 19* 29 27 26 23  21*  --   GLUCOSE 175* 112* 131* 135* 144* 169*  --   BUN 28* 28* 22 24* 30* 44*  --   CREATININE 2.00* 1.83* 1.69* 1.76* 1.68* 2.26*  --   CALCIUM  8.5* 9.1 9.1 8.9 8.7* 8.7*  --   MG 2.0 2.1 2.0 2.4  --   --   --    Liver Function Tests: Recent Labs  Lab 06/23/23 1857 06/24/23 0329 06/28/23 0913  AST 19 26 24   ALT 11 16 20   ALKPHOS 107 106 81  BILITOT 0.6 0.5 2.1*  PROT 6.4* 6.7 6.8  ALBUMIN  3.0* 3.2* 2.9*   CBG: Recent Labs  Lab 06/25/23 0731 06/25/23 1129 06/25/23 1543 06/27/23 1127 06/28/23 0911  GLUCAP 141* 157* 103* 194* 161*    Discharge time spent: greater than 30 minutes.  Signed: Brenna Cam, MD [facility] 06/28/2023

## 2023-06-28 NOTE — Plan of Care (Signed)
 Main focus of care tonight is safety and post IR procedure monitoring for complications. Secondary mobility, aspiration precaution, maintain skin integrity as priorities

## 2023-06-28 NOTE — ED Notes (Signed)
 EMTALA reviewed by this RN.

## 2023-06-28 NOTE — Anesthesia Procedure Notes (Signed)
 Procedure Name: Intubation Date/Time: 06/28/2023 10:50 AM  Performed by: Hebert Littler, CRNAPre-anesthesia Checklist: Patient identified Patient Re-evaluated:Patient Re-evaluated prior to induction Oxygen  Delivery Method: Circle system utilized Preoxygenation: Pre-oxygenation with 100% oxygen  Induction Type: IV induction and Rapid sequence Laryngoscope Size: Glidescope and 3 Grade View: Grade I Tube size: 7.0 mm Number of attempts: 1 Airway Equipment and Method: Video-laryngoscopy and Patient positioned with wedge pillow Placement Confirmation: ETT inserted through vocal cords under direct vision, positive ETCO2, CO2 detector and breath sounds checked- equal and bilateral Secured at: 22 cm Tube secured with: Tape

## 2023-06-28 NOTE — Anesthesia Postprocedure Evaluation (Signed)
 Anesthesia Post Note  Patient: [name]  Procedure(s) Performed: RADIOLOGY WITH ANESTHESIA     Patient location during evaluation: PACU Anesthesia Type: General Level of consciousness: sedated Pain management: pain level controlled Vital Signs Assessment: post-procedure vital signs reviewed and stable Respiratory status: patient remains intubated per anesthesia plan Cardiovascular status: stable Postop Assessment: no apparent nausea or vomiting Anesthetic complications: no   No notable events documented.  Last Vitals:  Vitals:   06/28/23 1400 06/28/23 1415  BP: 136/82 (!) 143/92  Pulse: 70 69  Resp: 17 17  SpO2: 100% 100%    Last Pain:  Vitals:   06/28/23 1100  PainSc: 0-No pain                 Lethaniel Rave

## 2023-06-28 NOTE — Transfer of Care (Signed)
 Immediate Anesthesia Transfer of Care Note  Patient: [name]  Procedure(s) Performed: RADIOLOGY WITH ANESTHESIA  Patient Location: PACU  Anesthesia Type:General  Level of Consciousness: sedated and Patient remains intubated per anesthesia plan  Airway & Oxygen  Therapy: Patient remains intubated per anesthesia plan and Patient placed on Ventilator (see vital sign flow sheet for setting)  Post-op Assessment: Report given to RN and Post -op Vital signs reviewed and stable  Post vital signs: Reviewed and stable  Last Vitals:  Vitals Value Taken Time  BP 100/70 06/28/23 1136  Temp    Pulse 74 06/28/23 1143  Resp 16 06/28/23 1143  SpO2 98 % 06/28/23 1143  Vitals shown include unfiled device data.  Last Pain:  Vitals:   06/28/23 1100  PainSc: 0-No pain         Complications: No notable events documented.

## 2023-06-29 ENCOUNTER — Inpatient Hospital Stay (HOSPITAL_COMMUNITY)

## 2023-06-29 ENCOUNTER — Encounter (HOSPITAL_COMMUNITY): Payer: Self-pay | Admitting: Neurology

## 2023-06-29 DIAGNOSIS — J9601 Acute respiratory failure with hypoxia: Secondary | ICD-10-CM | POA: Diagnosis not present

## 2023-06-29 DIAGNOSIS — I69391 Dysphagia following cerebral infarction: Secondary | ICD-10-CM | POA: Diagnosis not present

## 2023-06-29 DIAGNOSIS — I959 Hypotension, unspecified: Secondary | ICD-10-CM

## 2023-06-29 DIAGNOSIS — E785 Hyperlipidemia, unspecified: Secondary | ICD-10-CM

## 2023-06-29 DIAGNOSIS — I472 Ventricular tachycardia, unspecified: Secondary | ICD-10-CM

## 2023-06-29 DIAGNOSIS — Z7982 Long term (current) use of aspirin: Secondary | ICD-10-CM

## 2023-06-29 DIAGNOSIS — I5022 Chronic systolic (congestive) heart failure: Secondary | ICD-10-CM | POA: Diagnosis not present

## 2023-06-29 DIAGNOSIS — N1832 Chronic kidney disease, stage 3b: Secondary | ICD-10-CM

## 2023-06-29 DIAGNOSIS — R569 Unspecified convulsions: Secondary | ICD-10-CM

## 2023-06-29 DIAGNOSIS — I509 Heart failure, unspecified: Secondary | ICD-10-CM

## 2023-06-29 DIAGNOSIS — I130 Hypertensive heart and chronic kidney disease with heart failure and stage 1 through stage 4 chronic kidney disease, or unspecified chronic kidney disease: Secondary | ICD-10-CM

## 2023-06-29 DIAGNOSIS — I4891 Unspecified atrial fibrillation: Secondary | ICD-10-CM | POA: Diagnosis not present

## 2023-06-29 DIAGNOSIS — I4821 Permanent atrial fibrillation: Secondary | ICD-10-CM | POA: Diagnosis not present

## 2023-06-29 DIAGNOSIS — C61 Malignant neoplasm of prostate: Secondary | ICD-10-CM | POA: Diagnosis not present

## 2023-06-29 DIAGNOSIS — J9690 Respiratory failure, unspecified, unspecified whether with hypoxia or hypercapnia: Secondary | ICD-10-CM

## 2023-06-29 DIAGNOSIS — E1122 Type 2 diabetes mellitus with diabetic chronic kidney disease: Secondary | ICD-10-CM

## 2023-06-29 DIAGNOSIS — I63512 Cerebral infarction due to unspecified occlusion or stenosis of left middle cerebral artery: Secondary | ICD-10-CM | POA: Diagnosis not present

## 2023-06-29 DIAGNOSIS — Z9889 Other specified postprocedural states: Secondary | ICD-10-CM | POA: Diagnosis not present

## 2023-06-29 LAB — COMPREHENSIVE METABOLIC PANEL WITH GFR
ALT: 17 U/L (ref 0–44)
AST: 20 U/L (ref 15–41)
Albumin: 2.3 g/dL — ABNORMAL LOW (ref 3.5–5.0)
Alkaline Phosphatase: 80 U/L (ref 38–126)
Anion gap: 10 (ref 5–15)
BUN: 32 mg/dL — ABNORMAL HIGH (ref 8–23)
CO2: 21 mmol/L — ABNORMAL LOW (ref 22–32)
Calcium: 8.5 mg/dL — ABNORMAL LOW (ref 8.9–10.3)
Chloride: 102 mmol/L (ref 98–111)
Creatinine, Ser: 1.82 mg/dL — ABNORMAL HIGH (ref 0.61–1.24)
GFR, Estimated: 35 mL/min — ABNORMAL LOW (ref >60)
Glucose, Bld: 129 mg/dL — ABNORMAL HIGH (ref 70–99)
Potassium: 4.2 mmol/L (ref 3.5–5.1)
Sodium: 133 mmol/L — ABNORMAL LOW (ref 135–145)
Total Bilirubin: 1.2 mg/dL (ref 0.0–1.2)
Total Protein: 6.5 g/dL (ref 6.5–8.1)

## 2023-06-29 LAB — CBC WITH DIFFERENTIAL/PLATELET
Abs Immature Granulocytes: 0.02 10*3/uL (ref 0.00–0.07)
Basophils Absolute: 0 10*3/uL (ref 0.0–0.1)
Basophils Relative: 0 %
Eosinophils Absolute: 0.1 10*3/uL (ref 0.0–0.5)
Eosinophils Relative: 1 %
HCT: 37.7 % — ABNORMAL LOW (ref 39.0–52.0)
Hemoglobin: 12.3 g/dL — ABNORMAL LOW (ref 13.0–17.0)
Immature Granulocytes: 0 %
Lymphocytes Relative: 7 %
Lymphs Abs: 0.3 10*3/uL — ABNORMAL LOW (ref 0.7–4.0)
MCH: 30.7 pg (ref 26.0–34.0)
MCHC: 32.6 g/dL (ref 30.0–36.0)
MCV: 94 fL (ref 80.0–100.0)
Monocytes Absolute: 1 10*3/uL (ref 0.1–1.0)
Monocytes Relative: 20 %
Neutro Abs: 3.6 10*3/uL (ref 1.7–7.7)
Neutrophils Relative %: 72 %
Platelets: 165 10*3/uL (ref 150–400)
RBC: 4.01 MIL/uL — ABNORMAL LOW (ref 4.22–5.81)
RDW: 14.7 % (ref 11.5–15.5)
WBC: 5 10*3/uL (ref 4.0–10.5)
nRBC: 0 % (ref 0.0–0.2)

## 2023-06-29 LAB — GLUCOSE, CAPILLARY
Glucose-Capillary: 130 mg/dL — ABNORMAL HIGH (ref 70–99)
Glucose-Capillary: 144 mg/dL — ABNORMAL HIGH (ref 70–99)
Glucose-Capillary: 158 mg/dL — ABNORMAL HIGH (ref 70–99)
Glucose-Capillary: 125 mg/dL — ABNORMAL HIGH (ref 70–99)
Glucose-Capillary: 148 mg/dL — ABNORMAL HIGH (ref 70–99)
Glucose-Capillary: 124 mg/dL — ABNORMAL HIGH (ref 70–99)

## 2023-06-29 LAB — LIPID PANEL
Cholesterol: 83 mg/dL (ref 0–200)
HDL: 23 mg/dL — ABNORMAL LOW (ref >40)
LDL Cholesterol: 32 mg/dL (ref 0–99)
Total CHOL/HDL Ratio: 3.6 ratio
Triglycerides: 142 mg/dL (ref <150)
VLDL: 28 mg/dL (ref 0–40)

## 2023-06-29 LAB — PROTIME-INR
INR: 1.4 — ABNORMAL HIGH (ref 0.8–1.2)
Prothrombin Time: 17.7 s — ABNORMAL HIGH (ref 11.4–15.2)

## 2023-06-29 LAB — APTT: aPTT: 33 s (ref 24–36)

## 2023-06-29 LAB — TRIGLYCERIDES: Triglycerides: 139 mg/dL (ref <150)

## 2023-06-29 MED ORDER — AMIODARONE HCL IN DEXTROSE 360-4.14 MG/200ML-% IV SOLN
30.0000 mg/h | INTRAVENOUS | Status: DC
  Administered 2023-06-29 – 2023-07-01 (×4): 30 mg/h via INTRAVENOUS
  Filled 2023-06-29 (×4): qty 200

## 2023-06-29 MED ORDER — VERAPAMIL HCL 40 MG PO TABS
60.0000 mg | ORAL_TABLET | Freq: Three times a day (TID) | ORAL | Status: DC
  Administered 2023-06-29: 40 mg
  Filled 2023-06-29 (×2): qty 2

## 2023-06-29 MED ORDER — ASPIRIN 300 MG RE SUPP
300.0000 mg | Freq: Every day | RECTAL | Status: DC
  Administered 2023-06-30 – 2023-07-01 (×2): 300 mg via RECTAL
  Filled 2023-06-29 (×3): qty 1

## 2023-06-29 MED ORDER — ATORVASTATIN CALCIUM 10 MG PO TABS
10.0000 mg | ORAL_TABLET | Freq: Every day | ORAL | Status: DC
  Administered 2023-06-29 – 2023-06-30 (×2): 10 mg
  Filled 2023-06-29 (×2): qty 1

## 2023-06-29 MED ORDER — PHENYLEPHRINE HCL-NACL 20-0.9 MG/250ML-% IV SOLN: 25.0000 ug/min | INTRAVENOUS | Status: DC

## 2023-06-29 MED ORDER — ASPIRIN 325 MG PO TABS
325.0000 mg | ORAL_TABLET | Freq: Every day | ORAL | Status: DC
  Administered 2023-06-29 – 2023-07-03 (×3): 325 mg via ORAL
  Filled 2023-06-29 (×4): qty 1

## 2023-06-29 MED ORDER — SODIUM CHLORIDE 0.9 % IV SOLN: 250.0000 mL | INTRAVENOUS | Status: DC

## 2023-06-29 MED ORDER — HEPARIN SODIUM (PORCINE) 5000 UNIT/ML IJ SOLN
5000.0000 [IU] | Freq: Three times a day (TID) | INTRAMUSCULAR | Status: DC
  Administered 2023-06-29 – 2023-07-03 (×12): 5000 [IU] via SUBCUTANEOUS
  Filled 2023-06-29 (×12): qty 1

## 2023-06-29 MED ORDER — PHENYLEPHRINE HCL-NACL 20-0.9 MG/250ML-% IV SOLN
25.0000 ug/min | INTRAVENOUS | Status: DC
  Administered 2023-06-29: 10 ug/min via INTRAVENOUS
  Administered 2023-06-30: 15 ug/min via INTRAVENOUS
  Administered 2023-06-30: 25 ug/min via INTRAVENOUS
  Filled 2023-06-29: qty 250

## 2023-06-29 MED ORDER — VERAPAMIL HCL 40 MG PO TABS
40.0000 mg | ORAL_TABLET | Freq: Two times a day (BID) | ORAL | Status: DC
  Administered 2023-06-29 – 2023-06-30 (×3): 40 mg
  Filled 2023-06-29 (×4): qty 1

## 2023-06-29 MED ORDER — AMIODARONE HCL IN DEXTROSE 360-4.14 MG/200ML-% IV SOLN
60.0000 mg/h | INTRAVENOUS | Status: AC
  Administered 2023-06-29: 60 mg/h via INTRAVENOUS
  Filled 2023-06-29: qty 200

## 2023-06-29 NOTE — Progress Notes (Addendum)
 STROKE TEAM PROGRESS NOTE    SIGNIFICANT HOSPITAL EVENTS 6/7-patient admitted, taken to interventional radiology with successful revascularization of left MCA M1 segment  INTERIM HISTORY/SUBJECTIVE Patient remains intubated after his procedure.  He has been hemodynamically stable and afebrile overnight.  OBJECTIVE  CBC    Component Value Date/Time   WBC 5.0 06/29/2023 0458   RBC 4.01 (L) 06/29/2023 0458   HGB 12.3 (L) 06/29/2023 0458   HGB 15.2 04/29/2023 1259   HGB 14.6 06/11/2019 0953   HCT 37.7 (L) 06/29/2023 0458   HCT 46.0 06/11/2019 0953   PLT 165 06/29/2023 0458   PLT 189 04/29/2023 1259   PLT 154 06/11/2019 0953   MCV 94.0 06/29/2023 0458   MCV 94 06/11/2019 0953   MCV 92 10/13/2013 0415   MCH 30.7 06/29/2023 0458   MCHC 32.6 06/29/2023 0458   RDW 14.7 06/29/2023 0458   RDW 13.3 06/11/2019 0953   RDW 15.3 (H) 10/13/2013 0415   LYMPHSABS 0.3 (L) 06/29/2023 0458   LYMPHSABS 2.4 10/13/2013 0415   MONOABS 1.0 06/29/2023 0458   MONOABS 0.8 10/13/2013 0415   EOSABS 0.1 06/29/2023 0458   EOSABS 0.2 10/13/2013 0415   BASOSABS 0.0 06/29/2023 0458   BASOSABS 0.0 10/13/2013 0415    BMET    Component Value Date/Time   NA 133 (L) 06/29/2023 0458   NA 144 10/11/2021 0936   NA 138 10/13/2013 0415   K 4.2 06/29/2023 0458   K 4.0 10/13/2013 0415   CL 102 06/29/2023 0458   CL 106 10/13/2013 0415   CO2 21 (L) 06/29/2023 0458   CO2 27 10/13/2013 0415   GLUCOSE 129 (H) 06/29/2023 0458   GLUCOSE 97 10/13/2013 0415   BUN 32 (H) 06/29/2023 0458   BUN 46 (H) 10/11/2021 0936   BUN 22 (H) 10/13/2013 0415   CREATININE 1.82 (H) 06/29/2023 0458   CREATININE 2.17 (H) 04/29/2023 1259   CREATININE 1.65 (H) 10/13/2013 0415   CALCIUM  8.5 (L) 06/29/2023 0458   CALCIUM  8.2 (L) 10/13/2013 0415   EGFR 22 (L) 10/11/2021 0936   GFRNONAA 35 (L) 06/29/2023 0458   GFRNONAA 29 (L) 04/29/2023 1259   GFRNONAA 39 (L) 10/13/2013 0415    IMAGING past 24 hours DG Abd 1 View Result Date:  06/28/2023 CLINICAL DATA:  Encounter for feeding tube placement EXAM: ABDOMEN - 1 VIEW COMPARISON:  Abdominal radiographs 01/20/2023 FINDINGS: Enteric tube tip at the gastroesophageal junction and side-port in the lower esophagus. Recommend advancement approximately 10 cm to ensure side port is within the stomach. IMPRESSION: Enteric tube tip at the gastroesophageal junction and side-port in the lower esophagus. Recommend advancement approximately 10 cm to ensure side port is within the stomach. Electronically Signed   By: Rozell Cornet M.D.   On: 06/28/2023 21:45   Portable Chest x-ray Result Date: 06/28/2023 CLINICAL DATA:  Intubation. EXAM: PORTABLE CHEST 1 VIEW COMPARISON:  Chest radiograph dated 06/23/2023. FINDINGS: Endotracheal tube approximately 4.5 cm above the carina. There is cardiomegaly with vascular congestion. Bibasilar atelectasis. Small right pleural effusion. No pneumothorax. No acute osseous pathology. IMPRESSION: 1. Endotracheal tube above the carina. 2. Cardiomegaly with vascular congestion.  Bibasilar atelectasis. Electronically Signed   By: Angus Bark M.D.   On: 06/28/2023 14:08    Vitals:   06/29/23 1030 06/29/23 1044 06/29/23 1100 06/29/23 1158  BP: 111/80  102/76   Pulse: (!) 108  96   Resp: 13  18   Temp:    98.3 F (36.8 C)  TempSrc:  Axillary  SpO2: 100% 99% 99%   Weight:      Height:         PHYSICAL EXAM General: Intubated elderly patient in no acute distress Psych:  Mood and affect appropriate for situation CV: A-fib on the monitor Respiratory: Respirations synchronous with ventilator  NEURO (sedation with propofol  paused): Pupils equal round and reactive to light, no oculocephalic reflex, corneal reflexes present bilaterally but weak, cough reflex present, no movement of right upper extremity, moves bilateral lower extremities and left upper extremity spontaneously and withdraws left upper extremity to noxious    ASSESSMENT/PLAN  [name] is a 89 y.o. male with history of A-fib on edoxaban  (switched to edoxaban  as Eliquis  interacted with prostate cancer treatment), CHF, left MCA stroke in 2021 status post thrombectomy, diabetes, hypertension, hyperlipidemia, sleep apnea on CPAP, CKD stage IIIb and prostate cancer on Xtandi  and leuprolide  admitted for acute onset right facial droop, right-sided weakness and altered mental status.  He was found to have left MCA M1 occlusion and was taken to interventional radiology for successful mechanical thrombectomy.  He remains intubated after the procedure.  NIH on Admission 26  Stroke:  left MCA infarct with left M1 occlusion s/p IR with TICI3, etiology likely A-fib on DOAC in the setting of recent cardioversion Code Stroke CT head stable acute cytotoxic edema in left MCA territory ASPECTS 7 CTA head & neck recurrent left MCA M1 occlusion, left VA moderate stenosis at origin, right VA occlusion Status post IR with TICI3 Follow-up CT pending MRI and MRA pending in a.m. 2D Echo EF 45 to 50% LDL 32 HgbA1c 8.7 VTE prophylaxis -heparin  subcu aspirin  81 mg daily and edoxaban  prior to admission, now on aspirin  325 versus PR 300.  Hold off DOAC for now given infarct size Therapy recommendations: pending Disposition: Pending  Hx of Stroke/TIA 08/2019 admitted for left BG/CR and caudate infarct.  CTA head and neck left M1 occlusion, right MCA occlusion.  CTP 0/98 cc.  S/p IR with TICI3.  MRI post IR showed left M1 patent.  EF 60 to 65%.  LDL 27, A1c 7.8.  Discharged on aspirin  325 and Lipitor 10.  Recommend discussed with patient cardiologist regarding DOAC.  Atrial fibrillation Was on pradaxa , then changed to eliquis  - ? Due to rectal bleeding ? AC was held during 06/2019 [facility] admission due to rectal bleeding and high-risk of recurrent bleeding.  CHADS-VASC score of 5 at that time, with 7.2% risk of stroke per year, but need to weigh risk of stroke versus risk of recurrent GI bleeding with  Eliquis . At the time of discharge, a mutual decision was made with patient, family, and medical team to not restart anticoagulation for fear of recurrence of life-threatening bleeding Stroke in 08/2019 as above, on discharge patient and family reluctant to start DOAC, therefore they were recommended to discuss with his cardiologist regarding DOAC as outpatient. Current home meds including amiodarone  200 mg daily, verapamil  180 mg daily, edoxaban  30 mg daily Had cardiac cath and cardioversion 2 days ago Still in afib RVR with pacing, continue telemetry On home amiodarone  and verapamil  Hold all DOAC for now given infarct size On aspirin   Seizure-like activity During round, with stimulation, patient had left facial, left arm and leg low amplitude twitching EEG no seizure Likely stimulation induced Continue monitoring  Hypertension Home meds: Verapamil  Stable BP goal less than 180/105 Long-term BP goal normotensive  Hyperlipidemia Home meds: Atorvastatin  10 mg daily LDL 32, goal < 70  On Lipitor 10 High intensity statin not indicated as LDL below goal Continue statin at discharge  Diabetes type II poorly controlled Home meds:  Farxiga  10 mg daily HgbA1c 8.7, goal < 7.0 CBGs SSI Recommend close follow-up with PCP for better DM control  Respiratory failure Patient left intubated after thrombectomy Ventilator management per CCM Wean and extubate when able  Dysphagia Patient has post-stroke dysphagia SLP consulted Now n.p.o. Advance diet as tolerated  Other Stroke Risk Factors Obesity, Body mass index is 31.27 kg/m., BMI >/= 30 associated with increased stroke risk, recommend weight loss, diet and exercise as appropriate  Obstructive sleep apnea, on CPAP at home CHF  Other Active Problems Prostate cancer-plan to resume home Xtandi  when patient able to take p.o. medications History of rectal bleeding CKD 3B, creatinine 2.26--1.82  Hospital day # 1  Patient seen by NP  with MD, MD to edit note as needed. Cortney E Bucky Cardinal , MSN, AGACNP-BC [facility] See Amion for schedule and pager information 06/29/2023 1:12 PM  ATTENDING NOTE: I reviewed above note and agree with the assessment and plan. Pt was seen and examined.   No family bedside, patient still intubated on propofol , telemetry showed A-fib with pacing, patient eyes closed, not following commands. With forced eye opening, eyes in mid position, not blinking to visual threat, doll's eyes sluggish, not tracking, PERRL. Corneal reflex weak on the right, present on the left, gag and cough present. Breathing over the vent.  Facial symmetry not able to test due to ET tube.  Tongue protrusion not cooperative. On pain stimulation, no significant movement in all extremities. Sensation, coordination and gait not tested.  Patient had recent cardioversion procedure, not sure if related to current cardioembolic event.  MRI and MRI pending due to pacemaker.  Repeat CT preliminary reading showed large stroke left MCA with petechial hemorrhage.  On aspirin  now, hold off DOAC due to infarct size and petechial hemorrhage.  Continue amiodarone , verapamil  and statin.  Left sided seizure-like activity likely stimulation induced.  Will continue monitoring.  For detailed assessment and plan, please refer to above as I have made changes wherever appropriate.   Consuelo Denmark, MD PhD Stroke Neurology 06/29/2023 6:06 PM  This patient is critically ill due to left MCA stroke status post IR, A-fib RVR, respite 3 failure and at significant risk of neurological worsening, death form recurrent stroke, hemorrhagic termination, heart failure and renal failure. This patient's care requires constant monitoring of vital signs, hemodynamics, respiratory and cardiac monitoring, review of multiple databases, neurological assessment, discussion with family, other specialists and medical decision making of high complexity. I spent 40  minutes of neurocritical care time in the care of this patient.  I discussed with CCM Dr. Villa Greaser      To contact Stroke Continuity provider, please refer to [email or web address]. After hours, contact General Neurology

## 2023-06-29 NOTE — Procedures (Signed)
 Patient Name: [name]  MRN: [id number]  Epilepsy Attending: [name]  Referring Physician/Provider: Colon Dear, NP  Date: 06/29/2023 Duration:   Patient history: 89yo m with left M1 stoke now with facial and left arm twitching. EEG to evaluate for seizure  Level of alertness: comatose  AEDs during EEG study: Propofol   Technical aspects: This EEG study was done with scalp electrodes positioned according to the 10-20 International system of electrode placement. Electrical activity was reviewed with band pass filter of 1-70Hz , sensitivity of 7 uV/mm, display speed of 8mm/sec with a 60Hz  notched filter applied as appropriate. EEG data were recorded continuously and digitally stored.  Video monitoring was available and reviewed as appropriate.  Description: EEG showed continuous generalized 3 to 6 Hz theta-delta slowing.  Patient was noted to have episodes of brief left hand movement in response to stimulation of foot or right arm.  Concomitant EEG before, during and after the event did not show any EEG changes suggest seizure.  Hyperventilation and photic stimulation were not performed.     ABNORMALITY - Continuous slow, generalized  IMPRESSION: This study is suggestive of severe diffuse encephalopathy, nonspecific etiology but likely related to sedation. No seizures or epileptiform discharges were seen throughout the recording.  Patient was noted to have episodes of brief left hand movement in response to stimulation of foot or right arm without concomitant EEG change. This was most likely NOT an epileptic event. However, focal motor seizure may not be seen on scalp EEG. Clinical correlation is recommended.  Norberta Stobaugh O Tomasz Steeves

## 2023-06-29 NOTE — Progress Notes (Signed)
 OT Cancellation Note  Patient Details Name: [name] MRN: [id number] DOB: February 10, 1933   Cancelled Treatment:    Reason Eval/Treat Not Completed: Patient not medically ready Pt remaining intubated and sedated s/p thrombectomy. Will follow and evaluate as pt medically ready.   Karilyn Ouch, OTR/L [facility] Office: [phone]  [name] 06/29/2023, 9:52 AM

## 2023-06-29 NOTE — Consult Note (Signed)
 CARDIOLOGY CONSULTATION  Patient ID: [name]; [id number]; [birth year]   Admit date: 06/28/2023 Date of Consult: 06/29/2023  Primary Care Provider: Laurence Pons, NP Primary Cardiologist: Constancia Delton, MD  HISTORY OF PRESENT ILLNESS  [name] is a 89 y.o. male currently admitted with recent onset right sided weakness and right sided facial droop with aphasia diagnosed with acute stroke and now status post aspiration thrombectomy of occluded left MCA proximal M1 segment.  He is currently intubated and undergoing supportive measures in the ICU.  Consulted by Dr. Alva to follow from a cardiac perspective in light of recent history.  He was recently hospitalized at [facility], presented with altered mental status in the setting of sustained monomorphic VT requiring emergent cardioversion in the ER.  High-sensitivity troponin I peaked at 1150 subsequently with echocardiogram demonstrating mild LV dysfunction, LVEF 45 to 50% range.  He was seen by [facility] cardiology, underwent cardiac catheterization with Dr. Parks Bollman on June 3 demonstrating nonobstructive CAD.  Temporarily off anticoagulation for procedure.  He was evaluated by our EP team (felt to have fascicular VT versus VT with exit on the LV septum near conduction system), treated with verapamil  and amiodarone .  He is currently sedated on the ventilator, cannot provide additional history.  Telemetry shows atrial fibrillation with frequent PVCs and bursts of NSVT.  Blood pressure has been low requiring pressor support intermittently.  Recent cardiac studies are noted below.  ROS  Pertinent review in history of present illness.  Past Medical History:  Diagnosis Date   Acute deep vein thrombosis (DVT) of femoral vein of left lower extremity (HCC)    Atrial fibrillation (HCC)    CHF (congestive heart failure) (HCC)    Chronic kidney disease    Diabetes (HCC)    Hearing loss    High blood pressure    History of bladder  problems    Hyperlipidemia    Kidney stones 11/22/2014   Liver cyst    OSA (obstructive sleep apnea)    Prostate cancer (HCC)    Prostate cancer (HCC)    Stroke (HCC)    Valvular heart disease     Past Surgical History:  Procedure Laterality Date   CATARACT EXTRACTION     COLONOSCOPY WITH PROPOFOL  N/A 07/02/2019   Procedure: COLONOSCOPY WITH PROPOFOL ;  Surgeon: Selena Daily, MD;  Location: [facility] ENDOSCOPY;  Service: Gastroenterology;  Laterality: N/A;   ESOPHAGOGASTRODUODENOSCOPY (EGD) WITH PROPOFOL  N/A 03/19/2022   Procedure: ESOPHAGOGASTRODUODENOSCOPY (EGD) WITH PROPOFOL ;  Surgeon: Luke Salaam, MD;  Location: [facility] ENDOSCOPY;  Service: Gastroenterology;  Laterality: N/A;   IR CT HEAD LTD  08/27/2019   IR PERCUTANEOUS ART THROMBECTOMY/INFUSION INTRACRANIAL INC DIAG ANGIO  08/27/2019       IR PERCUTANEOUS ART THROMBECTOMY/INFUSION INTRACRANIAL INC DIAG ANGIO  08/27/2019   LEFT HEART CATH AND CORONARY ANGIOGRAPHY N/A 06/24/2023   Procedure: LEFT HEART CATH AND CORONARY ANGIOGRAPHY;  Surgeon: Percival Brace, MD;  Location: [facility] INVASIVE CV LAB;  Service: Cardiovascular;  Laterality: N/A;   LOWER EXTREMITY ANGIOGRAPHY Left 12/25/2020   Procedure: Lower Extremity Angiography;  Surgeon: Celso College, MD;  Location: [facility] INVASIVE CV LAB;  Service: Cardiovascular;  Laterality: Left;   PACEMAKER LEADLESS INSERTION N/A 12/19/2020   Procedure: PACEMAKER LEADLESS INSERTION;  Surgeon: Percival Brace, MD;  Location: [facility] INVASIVE CV LAB;  Service: Cardiovascular;  Laterality: N/A;   PROSTATE CANCER     PROSTATE SURGERY     RADIOLOGY WITH ANESTHESIA N/A 08/27/2019   Procedure: IR WITH ANESTHESIA;  Surgeon:  Luellen Sages, MD;  Location: [facility] OR;  Service: Radiology;  Laterality: N/A;     INPATIENT MEDICATIONS Scheduled Meds:  Chlorhexidine  Gluconate Cloth  6 each Topical Daily   famotidine   20 mg Per Tube BID   mouth rinse  15 mL Mouth Rinse Q2H   polyethylene glycol  17 g Per Tube  Daily   verapamil   40 mg Per Tube Q12H   Continuous Infusions:  sodium chloride  75 mL/hr at 06/29/23 1000   amiodarone      amiodarone      propofol  (DIPRIVAN ) infusion 25 mcg/kg/min (06/29/23 1000)   PRN Meds: acetaminophen  **OR** acetaminophen  (TYLENOL ) oral liquid 160 mg/5 mL **OR** acetaminophen , docusate, fentaNYL  (SUBLIMAZE ) injection, fentaNYL  (SUBLIMAZE ) injection, mouth rinse  ALLERGIES No Known Allergies  SOCIAL HISTORY  Social History   Tobacco Use   Smoking status: Former    Types: Cigarettes   Smokeless tobacco: Never  Substance Use Topics   Alcohol use: No    FAMILY HISTORY   The patient's family history includes Colon cancer in his mother; Diabetes in his brother and another family member; High blood pressure in an other family member; Prostate cancer in his brother.  PHYSICAL EXAM & DATA  Vitals:   06/29/23 1000 06/29/23 1030 06/29/23 1100 06/29/23 1158  BP: 123/77 111/80 102/76   Pulse: (!) 101 (!) 108 96   Resp: 16 13 18    Temp:    98.3 F (36.8 C)  TempSrc:    Axillary  SpO2: 98% 100% 99%   Weight:      Height:        Intake/Output Summary (Last 24 hours) at 06/29/2023 1203 Last data filed at 06/29/2023 1000 Gross per 24 hour  Intake 1593.86 ml  Output 800 ml  Net 793.86 ml   Filed Weights   06/28/23 1700  Weight: 107.5 kg   Body mass index is 31.27 kg/m.   Gen: Patient is currently sedated and on the ventilator. HEENT: Conjunctiva and lids normal. Neck: Supple, no elevated JVP or carotid bruits. Lungs: Clear to auscultation anteriorly, nonlabored breathing at rest. Cardiac: Irregularly irregular, soft systolic murmur, no obvious gallop or rub. Abdomen: Soft, bowel sounds present. Extremities: SCDs in place.  No pitting edema. Skin: Warm and dry. Musculoskeletal: No kyphosis. Neuropsychiatric: Sedated.  EKG:  An ECG dated June 2025 was personally reviewed today and demonstrated:  Atrial fibrillation with intermittent ventricular pacing  and PVCs.  Telemetry:  I personally reviewed telemetry which shows atrial fibrillation with frequent PVCs and bursts of NSVT.  RELEVANT CV STUDIES  Echocardiogram 06/24/2023: 1. Technically difficult study.   2. Left ventricular ejection fraction, by estimation, is 45 to 50%. The  left ventricle has mildly decreased function. The left ventricle  demonstrates regional wall motion abnormalities (see scoring  diagram/findings for description). There is moderate  left ventricular hypertrophy. Left ventricular diastolic parameters are  indeterminate.   3. Right ventricular systolic function was not well visualized. The right  ventricular size is not well visualized.   4. Left atrial size was moderately dilated.   5. The mitral valve is normal in structure. Trivial mitral valve  regurgitation.   6. The aortic valve is tricuspid. Aortic valve regurgitation is not  visualized.   Cardiac catheterization 06/24/2023:   Ost LAD to Prox LAD lesion is 40% stenosed.   There is mild left ventricular systolic dysfunction.   The left ventricular ejection fraction is 35-45% by visual estimate.   1.  Insignificant coronary artery disease with 40-50% ostial/proximal  LAD 2.  Mild reduced left ventricular function with estimate LVEF 40%  LABORATORY DATA  Chemistry Recent Labs  Lab 06/27/23 0620 06/28/23 0913 06/28/23 1417 06/29/23 0458  NA 134* 131* 133* 133*  K 4.4 4.6 4.3 4.2  CL 104 99  --  102  CO2 23 21*  --  21*  GLUCOSE 144* 169*  --  129*  BUN 30* 44*  --  32*  CREATININE 1.68* 2.26*  --  1.82*  CALCIUM  8.7* 8.7*  --  8.5*  GFRNONAA 39* 27*  --  35*  ANIONGAP 7 11  --  10    Recent Labs  Lab 06/24/23 0329 06/28/23 0913 06/29/23 0458  PROT 6.7 6.8 6.5  ALBUMIN  3.2* 2.9* 2.3*  AST 26 24 20   ALT 16 20 17   ALKPHOS 106 81 80  BILITOT 0.5 2.1* 1.2   Hematology Recent Labs  Lab 06/26/23 0405 06/28/23 0913 06/28/23 1417 06/29/23 0458  WBC 5.6 8.1  --  5.0  RBC 4.69 4.25  --   4.01*  HGB 14.4 13.1 12.2* 12.3*  HCT 44.5 40.2 36.0* 37.7*  MCV 94.9 94.6  --  94.0  MCH 30.7 30.8  --  30.7  MCHC 32.4 32.6  --  32.6  RDW 15.1 14.6  --  14.7  PLT 131* 148*  --  165   Cardiac Enzymes Recent Labs  Lab 06/23/23 2044 06/24/23 0122 06/24/23 0329 06/24/23 0827 06/24/23 1012  TROPONINIHS 177* 924* 1,144* 1,158* 927*   BNP Recent Labs  Lab 06/23/23 1857  BNP 495.0*     DDimer Recent Labs  Lab 06/26/23 1304  DDIMER 0.67*    Lipid Panel     Component Value Date/Time   CHOL 83 06/29/2023 0459   CHOL 97 (L) 06/11/2019 0953   CHOL 140 10/12/2013 0732   TRIG 142 06/29/2023 0459   TRIG 139 10/12/2013 0732   HDL 23 (L) 06/29/2023 0459   HDL 47 06/11/2019 0953   HDL 43 10/12/2013 0732   CHOLHDL 3.6 06/29/2023 0459   VLDL 28 06/29/2023 0459   VLDL 28 10/12/2013 0732   LDLCALC 32 06/29/2023 0459   LDLCALC 34 06/11/2019 0953   LDLCALC 69 10/12/2013 0732   LABVLDL 16 06/11/2019 0953    RADIOLOGY/STUDIES DG Abd 1 View Result Date: 06/28/2023 CLINICAL DATA:  Encounter for feeding tube placement EXAM: ABDOMEN - 1 VIEW COMPARISON:  Abdominal radiographs 01/20/2023 FINDINGS: Enteric tube tip at the gastroesophageal junction and side-port in the lower esophagus. Recommend advancement approximately 10 cm to ensure side port is within the stomach. IMPRESSION: Enteric tube tip at the gastroesophageal junction and side-port in the lower esophagus. Recommend advancement approximately 10 cm to ensure side port is within the stomach. Electronically Signed   By: Rozell Cornet M.D.   On: 06/28/2023 21:45   Portable Chest x-ray Result Date: 06/28/2023 CLINICAL DATA:  Intubation. EXAM: PORTABLE CHEST 1 VIEW COMPARISON:  Chest radiograph dated 06/23/2023. FINDINGS: Endotracheal tube approximately 4.5 cm above the carina. There is cardiomegaly with vascular congestion. Bibasilar atelectasis. Small right pleural effusion. No pneumothorax. No acute osseous pathology. IMPRESSION:  1. Endotracheal tube above the carina. 2. Cardiomegaly with vascular congestion.  Bibasilar atelectasis. Electronically Signed   By: Angus Bark M.D.   On: 06/28/2023 14:08   CT ANGIO HEAD NECK W WO CM (CODE STROKE) Result Date: 06/28/2023 CLINICAL DATA:  89 year old male code stroke. 2021 left M1 occlusion treated with thrombectomy. Questionable recurrent left MCA cytotoxic edema on plain  head CT today. EXAM: CT ANGIOGRAPHY HEAD AND NECK TECHNIQUE: Multidetector CT imaging of the head and neck was performed using the standard protocol during bolus administration of intravenous contrast. Multiplanar CT image reconstructions and MIPs were obtained to evaluate the vascular anatomy. Carotid stenosis measurements (when applicable) are obtained utilizing NASCET criteria, using the distal internal carotid diameter as the denominator. RADIATION DOSE REDUCTION: This exam was performed according to the departmental dose-optimization program which includes automated exposure control, adjustment of the mA and/or kV according to patient size and/or use of iterative reconstruction technique. CONTRAST:  Not specified. COMPARISON:  Head CT at 0917 hours.  Prior CTA 08/27/2019. FINDINGS: CTA NECK Skeleton: Cervical spine degeneration. No acute osseous abnormality identified. Upper chest: Small layering pleural effusions. Pulmonary arterial dominant contrast timing in the mediastinum. Other neck: Nonvascular neck soft tissue spaces are within normal limits. Aortic arch: Calcified aortic atherosclerosis. Bovine arch configuration. Right carotid system: Tortuous brachiocephalic artery and proximal right CCA. Patent to the skull base with minimal atherosclerosis. Left carotid system: Bovine left CCA origin with minimal plaque and no stenosis. Soft and calcified plaque at the left ICA origin but patent left ICA to the skull base with no significant stenosis. Vertebral arteries: Chronically occluded proximal right vertebral  artery. Reconstitution in the V2 segment seen in 2021 is not apparent although contrast timing is earlier today. Proximal left subclavian artery atherosclerosis without stenosis. Calcified plaque at the left vertebral artery origin with moderate stenosis, remains patent. Left vertebral artery caliber stable since 2021, the vessel is patent to the skull base without additional stenosis. CTA HEAD Posterior circulation: Patent left V4 segment with mild atherosclerosis, no significant stenosis to the vertebrobasilar junction. Faint probably retrograde enhancement of chronically diminutive right V4 segment. Left PICA origin remains patent. Patent basilar artery without stenosis. Patent SCA and PCA origins. Posterior communicating arteries are diminutive or absent. Bilateral PCA branches are stable and within normal limits. Anterior circulation: Suboptimal arterial contrast timing. Both ICA siphons are patent. Right greater than left ICA siphon calcified atherosclerosis. Mild to moderate right ICA anterior genu and supraclinoid stenosis. No significant left ICA siphon stenosis. Patent carotid termini. Patent MCA and ACA origins. A1 segments are stable, the left is mildly dominant and the right is mildly irregular. ACA branch early contrast timing, stable since 2021. Right MCA M1 segment, right MCA bifurcation are patent. Right MCA branches appear stable since 2021. Recurrent left MCA M1 occlusion 5 mm distal to its origin on series 10 image 12 and series 9, image 18 with no reconstitution on these early phase images. Venous sinuses: Early contrast timing, not evaluated. Anatomic variants: Effectively dominant left vertebral artery, bovine arch. Review of the MIP images confirms the above findings Preliminary report of left MCA M1 ELVO was communicated to Dr. Cleone Dad at 9:32 am on 06/28/2023 by text page via the [facility] messaging system. IMPRESSION: 1. Positive for Recurrent Left MCA M1 Occlusion (5 mm distal to origin). This  was communicated to Dr. Cleone Dad at 9:32 am on 06/28/2023 by text page via the [facility] messaging system. 2. Otherwise largely stable CTA since 2021 when allowing for early arterial contrast timing today. Chronically occluded right vertebral artery. Moderate Left Vertebral Artery origin stenosis. Aortic Atherosclerosis (ICD10-I70.0). Electronically Signed   By: Marlise Simpers M.D.   On: 06/28/2023 09:38   CT HEAD CODE STROKE WO CONTRAST Result Date: 06/28/2023 CLINICAL DATA:  Code stroke. 89 year old male. History of 2021 left MCA M1 occlusion treated with thrombectomy. EXAM: CT HEAD  WITHOUT CONTRAST TECHNIQUE: Contiguous axial images were obtained from the base of the skull through the vertex without intravenous contrast. RADIATION DOSE REDUCTION: This exam was performed according to the departmental dose-optimization program which includes automated exposure control, adjustment of the mA and/or kV according to patient size and/or use of iterative reconstruction technique. COMPARISON:  Brain MRI 08/28/2019.  Head CT 08/27/2019. FINDINGS: Brain: Chronic dense globus pallidus calcifications. Chronic posterior left basal ganglia ischemia with encephalomalacia. Less motion artifact than on the 2021 head CT. Right hemisphere and posterior fossa gray-white differentiation appears stable since 2021. But on series 3 image 21 there is possible acute cytotoxic edema in the left MCA Tara 4 toward Territory spanning the M4 through M6 segments. No intracranial hemorrhage or mass effect. No ventriculomegaly. Vascular: Calcified atherosclerosis at the skull base. No suspicious intracranial vascular hyperdensity. Skull: Stable and intact.  No acute osseous abnormality identified. Sinuses/Orbits: Visualized paranasal sinuses and mastoids are stable and well aerated. Other: Leftward gaze. Chronic postoperative changes to both globes. Visualized scalp soft tissues are within normal limits. ASPECTS [facility] Stroke Program Early CT Score) Total  score (0-10 with 10 being normal): 7 (abnormal M4, M5, and M6) IMPRESSION: 1. Possible Acute cytotoxic edema Left MCA territory, ASPECTS 7. 2. No acute intracranial hemorrhage or mass effect. 3. The above was communicated to Dr. Cleone Dad at 9:29 am on 06/28/2023 by text page via the [facility] messaging system. 4. Chronic left basal ganglia infarct., globus pallidus calcifications, Calcified atherosclerosis at the skull base. Electronically Signed   By: Marlise Simpers M.D.   On: 06/28/2023 09:30    ASSESSMENT & PLAN  1.  Nonobstructive CAD documented by recent cardiac catheterization at [facility], performed on June 3 by Dr. Parks Bollman.  2.  HFmrEF, recent echocardiogram on June 3 at [facility] demonstrated LVEF 45 to 50% range.  3.  History of monomorphic VT status post emergent cardioversion on June 2 at [facility].  He was evaluated by EP, felt to have fascicular VT versus VT with exit on the LV septum near conduction system.  Has been treated with amiodarone  and verapamil .  4.  Permanent atrial fibrillation by history, CHA2DS2-VASc score is 6.  Typically on Savaysa  30 mg daily as an outpatient.  5.  Acute stroke documented June 7 status post aspiration thrombectomy of occluded left MCA proximal M1 segment.  6.  CKD stage IIIb, creatinine 1.82 with GFR 35.  7.  Stage IV metastatic prostate cancer, on hormone therapy.  Discussed with CCM team.  Would transition from oral amiodarone  to IV amiodarone .  Has not been able to receive verapamil  consistently given recent hypotension, can resume when able.  Anticipate resumption of anticoagulation when cleared by neuroradiology.  Continue statin therapy.  For questions or updates, please contact [facility] Please consult [email or web address] for contact info under   Signed, Teddie Favre, MD  06/29/2023 12:03 PM

## 2023-06-29 NOTE — Progress Notes (Signed)
 Patient ID: [name], male   DOB: 01/03/1934, 89 y.o.   MRN: [id number]    Referring Physician(s): Bryson Carbine, MD   Supervising Physician: Luellen Sages  Patient Status:  [facility] - In-pt  Chief Complaint:  CVA, left M1 occlusion. S/p thrombectomy 06/28/23  Subjective:  Pt day 1 post left M1 thrombectomy for acute CVA. Pt remains intubated and sedated. Left groin puncture site appears well.  Neurology in the room when evaluating patient, they plan on repeat brain MRI today and trying to wean patient off sedation for further evaluation.  Allergies: Patient has no known allergies.  Medications: Prior to Admission medications   Medication Sig Start Date End Date Taking? Authorizing Provider  albuterol  (VENTOLIN  HFA) 108 (90 Base) MCG/ACT inhaler Inhale 2 puffs into the lungs every 4 (four) hours as needed for wheezing or shortness of breath. 12/26/22   Laurence Pons, NP  amiodarone  (PACERONE ) 200 MG tablet Take 2 tablets (400 mg total) by mouth 2 (two) times daily for 5 days, THEN 2 tablets (400 mg total) daily for 5 days, THEN 1 tablet (200 mg total) daily for 5 days. 06/27/23 07/12/23  Brenna Cam, MD  aspirin  EC 81 MG tablet Take 1 tablet (81 mg total) by mouth daily. Swallow whole. 06/28/23   Brenna Cam, MD  atorvastatin  (LIPITOR) 10 MG tablet TAKE 1 TABLET(10 MG) BY MOUTH DAILY 12/26/22   Laurence Pons, NP  Cholecalciferol (VITAMIN D-3 PO) Take 1 capsule by mouth daily.    [provider]  dapagliflozin  propanediol (FARXIGA ) 10 MG TABS tablet Take 1 tablet (10 mg total) by mouth daily. 12/26/22   Laurence Pons, NP  edoxaban  (SAVAYSA ) 30 MG TABS tablet Take 30 mg by mouth daily. 11/01/21   [provider]  enzalutamide  (XTANDI ) 40 MG tablet Take 4 tablets (160 mg total) by mouth daily. 04/29/23   Timmy Forbes, MD  ferrous sulfate  (FEROSUL) 325 (65 FE) MG tablet Take 1 tablet by mouth daily. 02/02/21   [provider]  furosemide  (LASIX ) 20 MG  tablet Take 1 tablet (20 mg total) by mouth daily. 12/26/22   Laurence Pons, NP  glucose blood (ONETOUCH VERIO) test strip USE ONE STRIP DAILY 01/27/23   Laurence Pons, NP  nitroGLYCERIN  (NITROSTAT ) 0.4 MG SL tablet Place 1 tablet (0.4 mg total) under the tongue every 5 (five) minutes as needed for chest pain. 06/27/23   Brenna Cam, MD  pantoprazole  (PROTONIX ) 40 MG tablet Take 1 tablet (40 mg total) by mouth daily. 12/26/22 12/26/23  Abernathy, Alyssa, NP  polyethylene glycol powder (GLYCOLAX /MIRALAX ) 17 GM/SCOOP powder Take 17 g by mouth daily as needed for mild constipation. Mix as directed. 06/27/23   Brenna Cam, MD  Semaglutide ,0.25 or 0.5MG /DOS, (OZEMPIC , 0.25 OR 0.5 MG/DOSE,) 2 MG/3ML SOPN Inject 0.5 mg into the skin once a week. 05/17/23   Abernathy, Alyssa, NP  Tiotropium Bromide  Monohydrate (SPIRIVA  RESPIMAT) 2.5 MCG/ACT AERS Inhale 2 puffs into the lungs daily. 12/26/22   Laurence Pons, NP  verapamil  (CALAN -SR) 180 MG CR tablet Take 1 tablet (180 mg total) by mouth daily. 06/27/23 07/27/23  Shah, Vipul, MD  vitamin B-12 (CYANOCOBALAMIN ) 1000 MCG tablet Take 1 tablet (1,000 mcg total) by mouth daily. 12/08/20   Laurence Pons, NP     Vital Signs: BP 125/81   Pulse (!) 101   Temp 99.6 F (37.6 C) (Axillary)   Resp 16   Ht 6\' 1"  (1.854 m)   Wt 236 lb 15.9 oz (107.5 kg)   SpO2  99%   BMI 31.27 kg/m   Physical Exam Constitutional:      Comments: Sedated  Cardiovascular:     Rate and Rhythm: Tachycardia present.  Pulmonary:     Comments: Intubated. Mildly course breath sounds bilaterally Abdominal:     Palpations: Abdomen is soft.  Skin:    Comments: L groin puncture site unremarkable. No swelling, oozing or bleeding. Dressed appropriately with gauze and Tegaderm.  Neurological:     Comments: Unable to assess secondary to sedation     Imaging: DG Abd 1 View Result Date: 06/28/2023 CLINICAL DATA:  Encounter for feeding tube placement EXAM: ABDOMEN - 1 VIEW COMPARISON:   Abdominal radiographs 01/20/2023 FINDINGS: Enteric tube tip at the gastroesophageal junction and side-port in the lower esophagus. Recommend advancement approximately 10 cm to ensure side port is within the stomach. IMPRESSION: Enteric tube tip at the gastroesophageal junction and side-port in the lower esophagus. Recommend advancement approximately 10 cm to ensure side port is within the stomach. Electronically Signed   By: Rozell Cornet M.D.   On: 06/28/2023 21:45   Portable Chest x-ray Result Date: 06/28/2023 CLINICAL DATA:  Intubation. EXAM: PORTABLE CHEST 1 VIEW COMPARISON:  Chest radiograph dated 06/23/2023. FINDINGS: Endotracheal tube approximately 4.5 cm above the carina. There is cardiomegaly with vascular congestion. Bibasilar atelectasis. Small right pleural effusion. No pneumothorax. No acute osseous pathology. IMPRESSION: 1. Endotracheal tube above the carina. 2. Cardiomegaly with vascular congestion.  Bibasilar atelectasis. Electronically Signed   By: Angus Bark M.D.   On: 06/28/2023 14:08   CT ANGIO HEAD NECK W WO CM (CODE STROKE) Result Date: 06/28/2023 CLINICAL DATA:  89 year old male code stroke. 2021 left M1 occlusion treated with thrombectomy. Questionable recurrent left MCA cytotoxic edema on plain head CT today. EXAM: CT ANGIOGRAPHY HEAD AND NECK TECHNIQUE: Multidetector CT imaging of the head and neck was performed using the standard protocol during bolus administration of intravenous contrast. Multiplanar CT image reconstructions and MIPs were obtained to evaluate the vascular anatomy. Carotid stenosis measurements (when applicable) are obtained utilizing NASCET criteria, using the distal internal carotid diameter as the denominator. RADIATION DOSE REDUCTION: This exam was performed according to the departmental dose-optimization program which includes automated exposure control, adjustment of the mA and/or kV according to patient size and/or use of iterative reconstruction  technique. CONTRAST:  Not specified. COMPARISON:  Head CT at 0917 hours.  Prior CTA 08/27/2019. FINDINGS: CTA NECK Skeleton: Cervical spine degeneration. No acute osseous abnormality identified. Upper chest: Small layering pleural effusions. Pulmonary arterial dominant contrast timing in the mediastinum. Other neck: Nonvascular neck soft tissue spaces are within normal limits. Aortic arch: Calcified aortic atherosclerosis. Bovine arch configuration. Right carotid system: Tortuous brachiocephalic artery and proximal right CCA. Patent to the skull base with minimal atherosclerosis. Left carotid system: Bovine left CCA origin with minimal plaque and no stenosis. Soft and calcified plaque at the left ICA origin but patent left ICA to the skull base with no significant stenosis. Vertebral arteries: Chronically occluded proximal right vertebral artery. Reconstitution in the V2 segment seen in 2021 is not apparent although contrast timing is earlier today. Proximal left subclavian artery atherosclerosis without stenosis. Calcified plaque at the left vertebral artery origin with moderate stenosis, remains patent. Left vertebral artery caliber stable since 2021, the vessel is patent to the skull base without additional stenosis. CTA HEAD Posterior circulation: Patent left V4 segment with mild atherosclerosis, no significant stenosis to the vertebrobasilar junction. Faint probably retrograde enhancement of chronically diminutive right V4 segment.  Left PICA origin remains patent. Patent basilar artery without stenosis. Patent SCA and PCA origins. Posterior communicating arteries are diminutive or absent. Bilateral PCA branches are stable and within normal limits. Anterior circulation: Suboptimal arterial contrast timing. Both ICA siphons are patent. Right greater than left ICA siphon calcified atherosclerosis. Mild to moderate right ICA anterior genu and supraclinoid stenosis. No significant left ICA siphon stenosis. Patent  carotid termini. Patent MCA and ACA origins. A1 segments are stable, the left is mildly dominant and the right is mildly irregular. ACA branch early contrast timing, stable since 2021. Right MCA M1 segment, right MCA bifurcation are patent. Right MCA branches appear stable since 2021. Recurrent left MCA M1 occlusion 5 mm distal to its origin on series 10 image 12 and series 9, image 18 with no reconstitution on these early phase images. Venous sinuses: Early contrast timing, not evaluated. Anatomic variants: Effectively dominant left vertebral artery, bovine arch. Review of the MIP images confirms the above findings Preliminary report of left MCA M1 ELVO was communicated to Dr. Cleone Dad at 9:32 am on 06/28/2023 by text page via the [facility] messaging system. IMPRESSION: 1. Positive for Recurrent Left MCA M1 Occlusion (5 mm distal to origin). This was communicated to Dr. Cleone Dad at 9:32 am on 06/28/2023 by text page via the [facility] messaging system. 2. Otherwise largely stable CTA since 2021 when allowing for early arterial contrast timing today. Chronically occluded right vertebral artery. Moderate Left Vertebral Artery origin stenosis. Aortic Atherosclerosis (ICD10-I70.0). Electronically Signed   By: Marlise Simpers M.D.   On: 06/28/2023 09:38   CT HEAD CODE STROKE WO CONTRAST Result Date: 06/28/2023 CLINICAL DATA:  Code stroke. 89 year old male. History of 2021 left MCA M1 occlusion treated with thrombectomy. EXAM: CT HEAD WITHOUT CONTRAST TECHNIQUE: Contiguous axial images were obtained from the base of the skull through the vertex without intravenous contrast. RADIATION DOSE REDUCTION: This exam was performed according to the departmental dose-optimization program which includes automated exposure control, adjustment of the mA and/or kV according to patient size and/or use of iterative reconstruction technique. COMPARISON:  Brain MRI 08/28/2019.  Head CT 08/27/2019. FINDINGS: Brain: Chronic dense globus pallidus calcifications.  Chronic posterior left basal ganglia ischemia with encephalomalacia. Less motion artifact than on the 2021 head CT. Right hemisphere and posterior fossa gray-white differentiation appears stable since 2021. But on series 3 image 21 there is possible acute cytotoxic edema in the left MCA Tara 4 toward Territory spanning the M4 through M6 segments. No intracranial hemorrhage or mass effect. No ventriculomegaly. Vascular: Calcified atherosclerosis at the skull base. No suspicious intracranial vascular hyperdensity. Skull: Stable and intact.  No acute osseous abnormality identified. Sinuses/Orbits: Visualized paranasal sinuses and mastoids are stable and well aerated. Other: Leftward gaze. Chronic postoperative changes to both globes. Visualized scalp soft tissues are within normal limits. ASPECTS [facility] Stroke Program Early CT Score) Total score (0-10 with 10 being normal): 7 (abnormal M4, M5, and M6) IMPRESSION: 1. Possible Acute cytotoxic edema Left MCA territory, ASPECTS 7. 2. No acute intracranial hemorrhage or mass effect. 3. The above was communicated to Dr. Cleone Dad at 9:29 am on 06/28/2023 by text page via the [facility] messaging system. 4. Chronic left basal ganglia infarct., globus pallidus calcifications, Calcified atherosclerosis at the skull base. Electronically Signed   By: Marlise Simpers M.D.   On: 06/28/2023 09:30    Labs:  CBC: Recent Labs    06/25/23 0355 06/26/23 0405 06/28/23 0913 06/28/23 1417 06/29/23 0458  WBC 5.5 5.6 8.1  --  5.0  HGB 14.3 14.4 13.1 12.2* 12.3*  HCT 43.6 44.5 40.2 36.0* 37.7*  PLT 152 131* 148*  --  165    COAGS: Recent Labs    09/08/22 1013 09/09/22 0436 06/23/23 2218 06/24/23 0700 06/28/23 0913 06/29/23 0458  INR 1.2 1.3*  --   --  1.9* 1.4*  APTT  --   --  27 106* 40* 33    BMP: Recent Labs    06/26/23 0405 06/27/23 0620 06/28/23 0913 06/28/23 1417 06/29/23 0458  NA 136 134* 131* 133* 133*  K 4.6 4.4 4.6 4.3 4.2  CL 104 104 99  --  102  CO2 26 23  21*  --  21*  GLUCOSE 135* 144* 169*  --  129*  BUN 24* 30* 44*  --  32*  CALCIUM  8.9 8.7* 8.7*  --  8.5*  CREATININE 1.76* 1.68* 2.26*  --  1.82*  GFRNONAA 37* 39* 27*  --  35*    LIVER FUNCTION TESTS: Recent Labs    06/23/23 1857 06/24/23 0329 06/28/23 0913 06/29/23 0458  BILITOT 0.6 0.5 2.1* 1.2  AST 19 26 24 20   ALT 11 16 20 17   ALKPHOS 107 106 81 80  PROT 6.4* 6.7 6.8 6.5  ALBUMIN  3.0* 3.2* 2.9* 2.3*    Assessment and Plan:  - Left groin site appears well, no swelling, bleeding/oozing. Unable to further assess pain as pt is sedated - Neurology leading care, they discussed plan for repeat MRI today and weaning off sedation if possible  Electronically Signed: Nicolasa Barrett, PA-C 06/29/2023, 9:30 AM   I spent a total of 15 Minutes at the the patient's bedside AND on the patient's hospital floor or unit, greater than 50% of which was counseling/coordinating care for left M1 thrombectomy follow up

## 2023-06-29 NOTE — Plan of Care (Incomplete)
 General - Well nourished, well developed, intubated on sedation.  Ophthalmologic - fundi not visualized due to noncooperation.  Cardiovascular - irregularly irregular heart rate and rhythm, with pacing.  Neuro - intubated on propofol , eyes closed, not following commands. With forced eye opening, eyes in mid position, not blinking to visual threat, doll's eyes sluggish, not tracking, PERRL. Corneal reflex weak on the right, present on the left, gag and cough present. Breathing over the vent.  Facial symmetry not able to test due to ET tube.  Tongue protrusion not cooperative. On pain stimulation, no significant movement in all extremities. Sensation, coordination and gait not tested.

## 2023-06-29 NOTE — Progress Notes (Signed)
 Patient transported from 4N22 to CT and back to 4N22 with nio complications noted.

## 2023-06-29 NOTE — Progress Notes (Signed)
 PT Cancellation Note  Patient Details Name: QUINNTON BURY MRN: [id number] DOB: [birth year]   Cancelled Treatment:    Reason Eval/Treat Not Completed: Medical issues which prohibited therapy this morning, pt remains intubated and sedated post-thrombectomy. Will continue to follow and evaluate once medically appropriate.   Barnabas Booth, PT, DPT   Acute Rehabilitation Department Office [phone] Secure Chat Communication Preferred   Lona Rist 06/29/2023, 9:46 AM

## 2023-06-29 NOTE — Progress Notes (Signed)
 EEG complete - results pending

## 2023-06-29 NOTE — Progress Notes (Signed)
 SLP Cancellation Note  Patient Details Name: [name] MRN: [id number] DOB: 1933-04-25   Cancelled treatment:        Pt on vent. Will follow for speech-language-cognitive assessment.   Naomia Bachelor 06/29/2023, 8:03 AM

## 2023-06-29 NOTE — Progress Notes (Signed)
 NAME:  [name], MRN:  [id number], DOB:  1933-07-02, LOS: 1 ADMISSION DATE:  06/28/2023, CONSULTATION DATE:  06/29/2023  REFERRING MD:  Alvira Josephs, neuro IR, CHIEF COMPLAINT: Right-sided weakness  History of Present Illness:  89 year old man who was recently admitted 6/2-06/27/2023 with hypotension, altered mental status, monomorphic V. tach requiring DCCV.  LHC did not show obstructive CAD.  He was discharged on verapamil . LKW was 0800 on 6/7.  He developed right-sided weakness and was noted to have right facial droop and aphasia on arrival to [facility] ED, initial saturation was 85% improved to 95% on 2 L Head CT showed hyperdense sign with aspects 7 CT angio confirmed left M1 occlusion. He was not given TNK due to DOAC last dose night before admission He underwent aspiration thrombectomy of occluded left MCA proximal M1 segment, required Neo-Synephrine during the procedure and brought to PACU intubated.  PCCM consulted   Pertinent  Medical History  Afib on edoxaban ,  CHF,  left MCA CVA in 2021 s/p thrombectomy with no residual,  Diabetes -2,  HTN, HLD,  OSA on CPAP,  hard of hearing,  CKD IIIb,  prostate Ca   Significant Hospital Events: Including procedures, antibiotic start and stop dates in addition to other pertinent events     Interim History / Subjective:  Critically ill, intubated and sedated on propofol  Afebrile Ectopy noted overnight, drops Lopressor  regular abdominal   Objective    Blood pressure 111/80, pulse (!) 108, temperature 99.6 F (37.6 C), temperature source Axillary, resp. rate 13, height 6\' 1"  (1.854 m), weight 107.5 kg, SpO2 100%.    Vent Mode: PRVC FiO2 (%):  [40 %-50 %] 40 % Set Rate:  [16 bmp] 16 bmp Vt Set:  [865 mL] 620 mL PEEP:  [5 cmH20] 5 cmH20 Plateau Pressure:  [18 cmH20-20 cmH20] 20 cmH20   Intake/Output Summary (Last 24 hours) at 06/29/2023 1107 Last data filed at 06/29/2023 1000 Gross per 24 hour  Intake 1993.86 ml  Output 800 ml   Net 1193.86 ml   Filed Weights   06/28/23 1700  Weight: 107.5 kg    Examination: General: Elderly, obese man, intubated, no distress HENT: Pinpoint pupils, no icterus, no JVD Lungs: Clear breath sounds bilateral, normal accessory muscle use Cardiovascular: S1-S2 no murmur Abdomen: Soft, nontender, obese Extremities: 1+ edema Neuro: Unresponsive, sedated, RASS -2   Labs show hyponatremia 133, BUN/creatinine improved to 32/1.8, no leukocytosis Chest x-ray shows cardiomegaly with vascular congestion  Resolved problem list   Assessment and Plan    Acute ischemic stroke -left MCA status post aspiration lobectomy Neurology planning for MRI  Acute respiratory failure with hypoxia -he was hypoxic to 85% on arrival to ED Previous spirometry 2022 has shown mild restriction, likely related to obesity On edoxaban  so-PE is not a consideration - Vent settings reviewed and adjusted -Start spontaneous breathing trials after imaging study today with plan for extubation  History of V. Tach requiring DCCV 06/2023- -continue  amiodarone   -will lower verapamil  due to soft BP -plan was for 400mg  twice daily for 5 days, then 400 mg daily for days then 200 mg daily - Will involve cardiology   SSS s/p micra Permanent atrial fibrillation -continue edoxaban  when okay with neurology HFpEF -new mildly reduced EF 45 to 50%, recent LHC 6/3 no significant CAD -Resume Farxiga  and Lasix   Hypotension -May be related to propofol  and positive pressure, okay to use low-dose neo as needed  AKI on CKD -monitor renal function and output -Appears stable  Stage IV metastatic prostate cancer -on hormonal therapy  Best Practice (right click and "Reselect all SmartList Selections" daily)   Diet/type: NPO DVT prophylaxis DOAC Pressure ulcer(s): N/A GI prophylaxis: N/A Lines: N/A Foley:  N/A Code Status:  full code Last date of multidisciplinary goals of care discussion [NA]  Labs   CBC: Recent  Labs  Lab 06/23/23 1857 06/24/23 0329 06/25/23 0355 06/26/23 0405 06/28/23 0913 06/28/23 1417 06/29/23 0458  WBC 5.4 4.9 5.5 5.6 8.1  --  5.0  NEUTROABS 2.4  --   --   --  5.4  --  3.6  HGB 14.0 14.7 14.3 14.4 13.1 12.2* 12.3*  HCT 43.2 45.7 43.6 44.5 40.2 36.0* 37.7*  MCV 96.0 96.0 94.4 94.9 94.6  --  94.0  PLT 166 154 152 131* 148*  --  165    Basic Metabolic Panel: Recent Labs  Lab 06/23/23 1857 06/24/23 0329 06/25/23 0355 06/26/23 0405 06/27/23 0620 06/28/23 0913 06/28/23 1417 06/28/23 2153 06/29/23 0458  NA 140 142 138 136 134* 131* 133*  --  133*  K 3.7 3.7 3.8 4.6 4.4 4.6 4.3  --  4.2  CL 110 106 105 104 104 99  --   --  102  CO2 19* 29 27 26 23  21*  --   --  21*  GLUCOSE 175* 112* 131* 135* 144* 169*  --   --  129*  BUN 28* 28* 22 24* 30* 44*  --   --  32*  CREATININE 2.00* 1.83* 1.69* 1.76* 1.68* 2.26*  --   --  1.82*  CALCIUM  8.5* 9.1 9.1 8.9 8.7* 8.7*  --   --  8.5*  MG 2.0 2.1 2.0 2.4  --   --   --  2.1  --    GFR: Estimated Creatinine Clearance: 36.1 mL/min (A) (by C-G formula based on SCr of 1.82 mg/dL (H)). Recent Labs  Lab 06/25/23 0355 06/26/23 0405 06/28/23 0913 06/29/23 0458  WBC 5.5 5.6 8.1 5.0    Liver Function Tests: Recent Labs  Lab 06/23/23 1857 06/24/23 0329 06/28/23 0913 06/29/23 0458  AST 19 26 24 20   ALT 11 16 20 17   ALKPHOS 107 106 81 80  BILITOT 0.6 0.5 2.1* 1.2  PROT 6.4* 6.7 6.8 6.5  ALBUMIN  3.0* 3.2* 2.9* 2.3*   No results for input(s): "LIPASE", "AMYLASE" in the last 168 hours. No results for input(s): "AMMONIA" in the last 168 hours.  ABG    Component Value Date/Time   PHART 7.452 (H) 06/28/2023 1417   PCO2ART 31.0 (L) 06/28/2023 1417   PO2ART 112 (H) 06/28/2023 1417   HCO3 21.8 06/28/2023 1417   TCO2 23 06/28/2023 1417   ACIDBASEDEF 2.0 06/28/2023 1417   O2SAT 99 06/28/2023 1417     Coagulation Profile: Recent Labs  Lab 06/28/23 0913 06/29/23 0458  INR 1.9* 1.4*    Cardiac Enzymes: No results  for input(s): "CKTOTAL", "CKMB", "CKMBINDEX", "TROPONINI" in the last 168 hours.  HbA1C: Hemoglobin A1C  Date/Time Value Ref Range Status  03/10/2023 03:37 PM 8.7 (A) 4.0 - 5.6 % Final  10/04/2022 10:45 AM 7.5 (A) 4.0 - 5.6 % Final   Hgb A1c MFr Bld  Date/Time Value Ref Range Status  08/28/2019 12:58 PM 6.4 (H) 4.8 - 5.6 % Final    Comment:    (NOTE) Pre diabetes:          5.7%-6.4%  Diabetes:              >  6.4%  Glycemic control for   <7.0% adults with diabetes   06/30/2019 11:32 PM 7.8 (H) 4.8 - 5.6 % Final    Comment:    (NOTE) Pre diabetes:          5.7%-6.4%  Diabetes:              >6.4%  Glycemic control for   <7.0% adults with diabetes     CBG: Recent Labs  Lab 06/28/23 0911 06/28/23 1946 06/28/23 2320 06/29/23 0324 06/29/23 0756  GLUCAP 161* 135* 144* 130* 144*    Critical care time: 35 m     Celene Coins MD. FCCP. [facility] Pager : [phone]  If no response to pager , please call [phone] until 7 pm After 7:00 pm call Elink  [phone]   06/29/2023

## 2023-06-30 ENCOUNTER — Inpatient Hospital Stay (HOSPITAL_COMMUNITY)

## 2023-06-30 ENCOUNTER — Encounter (HOSPITAL_COMMUNITY): Payer: Self-pay | Admitting: Radiology

## 2023-06-30 ENCOUNTER — Other Ambulatory Visit: Payer: Self-pay

## 2023-06-30 DIAGNOSIS — C61 Malignant neoplasm of prostate: Secondary | ICD-10-CM | POA: Diagnosis not present

## 2023-06-30 DIAGNOSIS — I4891 Unspecified atrial fibrillation: Secondary | ICD-10-CM | POA: Diagnosis not present

## 2023-06-30 DIAGNOSIS — I4821 Permanent atrial fibrillation: Secondary | ICD-10-CM | POA: Diagnosis not present

## 2023-06-30 DIAGNOSIS — I69391 Dysphagia following cerebral infarction: Secondary | ICD-10-CM | POA: Diagnosis not present

## 2023-06-30 DIAGNOSIS — Z9889 Other specified postprocedural states: Secondary | ICD-10-CM | POA: Diagnosis not present

## 2023-06-30 DIAGNOSIS — I63512 Cerebral infarction due to unspecified occlusion or stenosis of left middle cerebral artery: Secondary | ICD-10-CM | POA: Diagnosis not present

## 2023-06-30 DIAGNOSIS — J9601 Acute respiratory failure with hypoxia: Secondary | ICD-10-CM | POA: Diagnosis not present

## 2023-06-30 LAB — BASIC METABOLIC PANEL WITH GFR
Anion gap: 8 (ref 5–15)
BUN: 24 mg/dL — ABNORMAL HIGH (ref 8–23)
CO2: 21 mmol/L — ABNORMAL LOW (ref 22–32)
Calcium: 7.7 mg/dL — ABNORMAL LOW (ref 8.9–10.3)
Chloride: 105 mmol/L (ref 98–111)
Creatinine, Ser: 1.33 mg/dL — ABNORMAL HIGH (ref 0.61–1.24)
GFR, Estimated: 51 mL/min — ABNORMAL LOW (ref >60)
Glucose, Bld: 250 mg/dL — ABNORMAL HIGH (ref 70–99)
Potassium: 4 mmol/L (ref 3.5–5.1)
Sodium: 134 mmol/L — ABNORMAL LOW (ref 135–145)

## 2023-06-30 LAB — HEMOGLOBIN A1C
Hgb A1c MFr Bld: 6.9 % — ABNORMAL HIGH (ref 4.8–5.6)
Mean Plasma Glucose: 151 mg/dL

## 2023-06-30 LAB — MAGNESIUM: Magnesium: 2 mg/dL (ref 1.7–2.4)

## 2023-06-30 LAB — GLUCOSE, CAPILLARY
Glucose-Capillary: 122 mg/dL — ABNORMAL HIGH (ref 70–99)
Glucose-Capillary: 147 mg/dL — ABNORMAL HIGH (ref 70–99)
Glucose-Capillary: 135 mg/dL — ABNORMAL HIGH (ref 70–99)
Glucose-Capillary: 108 mg/dL — ABNORMAL HIGH (ref 70–99)
Glucose-Capillary: 108 mg/dL — ABNORMAL HIGH (ref 70–99)
Glucose-Capillary: 144 mg/dL — ABNORMAL HIGH (ref 70–99)

## 2023-06-30 LAB — PHOSPHORUS: Phosphorus: 3 mg/dL (ref 2.5–4.6)

## 2023-06-30 MED ORDER — FUROSEMIDE 10 MG/ML IJ SOLN
40.0000 mg | Freq: Once | INTRAMUSCULAR | Status: AC
  Administered 2023-06-30: 40 mg via INTRAVENOUS
  Filled 2023-06-30: qty 4

## 2023-06-30 MED ORDER — ORAL CARE MOUTH RINSE: 15.0000 mL | OROMUCOSAL | Status: DC | PRN

## 2023-06-30 MED ORDER — SODIUM CHLORIDE 0.9 % IV SOLN: 250.0000 mL | INTRAVENOUS | Status: AC

## 2023-06-30 MED ORDER — PHENYLEPHRINE HCL-NACL 20-0.9 MG/250ML-% IV SOLN: 25.0000 ug/min | INTRAVENOUS | Status: DC

## 2023-06-30 MED ORDER — FAMOTIDINE 20 MG PO TABS
20.0000 mg | ORAL_TABLET | Freq: Every day | ORAL | Status: DC
  Administered 2023-06-30: 20 mg
  Filled 2023-06-30: qty 1

## 2023-06-30 MED ORDER — ORAL CARE MOUTH RINSE
15.0000 mL | OROMUCOSAL | Status: DC
  Administered 2023-06-30 – 2023-07-11 (×38): 15 mL via OROMUCOSAL

## 2023-06-30 MED ORDER — DEXMEDETOMIDINE HCL IN NACL 400 MCG/100ML IV SOLN
0.0000 ug/kg/h | INTRAVENOUS | Status: DC
  Administered 2023-06-30: 0.4 ug/kg/h via INTRAVENOUS
  Filled 2023-06-30: qty 100

## 2023-06-30 NOTE — Progress Notes (Signed)
  Progress Note  Patient Name: [name] Date of Encounter: 06/30/2023 [facility] Cardiologist: Constancia Delton, MD    Pt was followed at [facility] by [facility] clinic for VT. Cath revealed no critical CAC LV function is mildly depressed at 45-50%.  He is having PVCs on tele   Continue amiodarone .   Amio was changed to IV due to inability for patient to take po meds.  Continue current meds, current plans . Please let us  know if he has any other episodes of sustained VT      Signed, Ahmad Alert, MD

## 2023-06-30 NOTE — Progress Notes (Signed)
 Patient ID: [name], male   DOB: 1933-12-14, 89 y.o.   MRN: [id number]    Referring Physician(s): Bryson Carbine, MD   Supervising Physician: Luellen Sages  Patient Status:  [facility] - In-pt  Chief Complaint:  CVA, left M1 occlusion. S/p thrombectomy 06/28/23  Subjective:  Patient remains intubated and sedated. Does not open his eye with sternal rub.  Moves left leg, does not follow commands.   Allergies: Patient has no known allergies.  Medications: Prior to Admission medications   Medication Sig Start Date End Date Taking? Authorizing Provider  albuterol  (VENTOLIN  HFA) 108 (90 Base) MCG/ACT inhaler Inhale 2 puffs into the lungs every 4 (four) hours as needed for wheezing or shortness of breath. 12/26/22   Laurence Pons, NP  amiodarone  (PACERONE ) 200 MG tablet Take 2 tablets (400 mg total) by mouth 2 (two) times daily for 5 days, THEN 2 tablets (400 mg total) daily for 5 days, THEN 1 tablet (200 mg total) daily for 5 days. 06/27/23 07/12/23  Brenna Cam, MD  aspirin  EC 81 MG tablet Take 1 tablet (81 mg total) by mouth daily. Swallow whole. 06/28/23   Brenna Cam, MD  atorvastatin  (LIPITOR) 10 MG tablet TAKE 1 TABLET(10 MG) BY MOUTH DAILY 12/26/22   Laurence Pons, NP  Cholecalciferol (VITAMIN D-3 PO) Take 1 capsule by mouth daily.    [provider]  dapagliflozin  propanediol (FARXIGA ) 10 MG TABS tablet Take 1 tablet (10 mg total) by mouth daily. 12/26/22   Laurence Pons, NP  edoxaban  (SAVAYSA ) 30 MG TABS tablet Take 30 mg by mouth daily. 11/01/21   [provider]  enzalutamide  (XTANDI ) 40 MG tablet Take 4 tablets (160 mg total) by mouth daily. 04/29/23   Timmy Forbes, MD  ferrous sulfate  (FEROSUL) 325 (65 FE) MG tablet Take 1 tablet by mouth daily. 02/02/21   [provider]  furosemide  (LASIX ) 20 MG tablet Take 1 tablet (20 mg total) by mouth daily. 12/26/22   Laurence Pons, NP  glucose blood (ONETOUCH VERIO) test strip USE ONE STRIP DAILY  01/27/23   Laurence Pons, NP  nitroGLYCERIN  (NITROSTAT ) 0.4 MG SL tablet Place 1 tablet (0.4 mg total) under the tongue every 5 (five) minutes as needed for chest pain. 06/27/23   Brenna Cam, MD  pantoprazole  (PROTONIX ) 40 MG tablet Take 1 tablet (40 mg total) by mouth daily. 12/26/22 12/26/23  Abernathy, Alyssa, NP  polyethylene glycol powder (GLYCOLAX /MIRALAX ) 17 GM/SCOOP powder Take 17 g by mouth daily as needed for mild constipation. Mix as directed. 06/27/23   Brenna Cam, MD  Semaglutide ,0.25 or 0.5MG /DOS, (OZEMPIC , 0.25 OR 0.5 MG/DOSE,) 2 MG/3ML SOPN Inject 0.5 mg into the skin once a week. 05/17/23   Laurence Pons, NP  Tiotropium Bromide  Monohydrate (SPIRIVA  RESPIMAT) 2.5 MCG/ACT AERS Inhale 2 puffs into the lungs daily. 12/26/22   Laurence Pons, NP  verapamil  (CALAN -SR) 180 MG CR tablet Take 1 tablet (180 mg total) by mouth daily. 06/27/23 07/27/23  Shah, Vipul, MD  vitamin B-12 (CYANOCOBALAMIN ) 1000 MCG tablet Take 1 tablet (1,000 mcg total) by mouth daily. 12/08/20   Laurence Pons, NP     Vital Signs: BP 123/74   Pulse 75   Temp 99 F (37.2 C) (Axillary)   Resp 16   Ht 6\' 1"  (1.854 m)   Wt 236 lb 15.9 oz (107.5 kg)   SpO2 100%   BMI 31.27 kg/m   Physical Exam Constitutional:      General: He is not in acute distress.  Comments: Sedated  Pulmonary:     Comments: Intubated. Mildly course breath sounds bilaterally Abdominal:     Palpations: Abdomen is soft.  Musculoskeletal:     Cervical back: Neck supple.  Skin:    General: Skin is warm and dry.     Coloration: Skin is not jaundiced or pale.     Comments: L groin puncture site unremarkable. No swelling, oozing or bleeding. Dressed appropriately with gauze and Tegaderm.  Neurological:     Comments: Unable to assess secondary to sedation     Imaging: DG CHEST PORT 1 VIEW Result Date: 06/29/2023 CLINICAL DATA:  OG tube placement EXAM: PORTABLE CHEST 1 VIEW COMPARISON:  Radiograph 06/28/2023 FINDINGS: Endotracheal  tube tip in the intrathoracic trachea 2.3 cm from the carina. The balloon in the upper trachea measures 3.1 cm in diameter. The tip of the enteric tube is below the inferior aspect of the image below the diaphragm. Stable cardiomegaly. Pulmonary vascular congestion. Bibasilar atelectasis and probable small right pleural effusion. No pneumothorax. No displaced rib fractures. IMPRESSION: 1. Endotracheal tube tip in the intrathoracic trachea 2.3 cm from the carina. Question overinflation of the tracheal balloon. The balloon in the upper trachea measures 3.1 cm in diameter. 2. The tip of the enteric tube is below the inferior aspect of the image below the diaphragm. 3. Cardiomegaly with pulmonary vascular congestion. 4. Bibasilar atelectasis and probable small right pleural effusion. Electronically Signed   By: Rozell Cornet M.D.   On: 06/29/2023 19:18   CT HEAD WO CONTRAST ( ) Result Date: 06/29/2023 CLINICAL DATA:  Stroke follow-up, acute left MCA territory infarct. EXAM: CT HEAD WITHOUT CONTRAST TECHNIQUE: Contiguous axial images were obtained from the base of the skull through the vertex without intravenous contrast. RADIATION DOSE REDUCTION: This exam was performed according to the departmental dose-optimization program which includes automated exposure control, adjustment of the mA and/or kV according to patient size and/or use of iterative reconstruction technique. COMPARISON:  CT head and CTA head and neck 07/28/2023. FINDINGS: Brain: Evolving left MCA territory infarct with decreased attenuation within the left temporal lobe, left frontal operculum, left insular cortex, as well as the left basal ganglia particularly the caudate head and lentiform nucleus. There is mild associated edema and sulcal effacement within this region without midline shift. Few faint areas of hyperattenuation within the posterior temporal lobe suggestive of developing petechial hemorrhage. No parenchymal hematoma. Basilar cisterns  are patent. No extra-axial fluid collection. Ventricles: The ventricles are normal. Vascular: Atherosclerotic calcifications at the skull base involving the carotid siphons more so on the right. There is mild density involving the distal aspect of the left M1 segment extending into left M2 branches. Skull: No acute or aggressive finding. Orbits: Bilateral lens replacement. Sinuses: No significant mucosal thickening or air-fluid level in the visualized paranasal sinuses. Other: Trace fluid in the left mastoid air cells. Partially visualized nasoenteric tube coiled within the nasopharynx. IMPRESSION: Evolving left MCA territory infarct with mild edema and sulcal effacement. No midline shift. Foci of hyperattenuation in the posterior left temporal lobe suggestive of developing petechial hemorrhage. No evidence of parenchymal hematoma. Hyperdense appearance of the distal left M1 segment and left M2 branches corresponding to findings of occlusion on CTA. Partially visualized enteric tube in the nasopharynx. Recommend radiographic correlation to evaluate tube positioning. Electronically Signed   By: Denny Flack M.D.   On: 06/29/2023 18:34   EEG adult Result Date: 06/29/2023 [name], MD     06/29/2023  1:41 PM Patient  Name: [name] MRN: [id number] Epilepsy Attending: [name] Referring Physician/Provider: Colon Dear, NP Date: 06/29/2023 Duration: Patient history: 89yo m with left M1 stoke now with facial and left arm twitching. EEG to evaluate for seizure Level of alertness: comatose AEDs during EEG study: Propofol  Technical aspects: This EEG study was done with scalp electrodes positioned according to the 10-20 International system of electrode placement. Electrical activity was reviewed with band pass filter of 1-70Hz , sensitivity of 7 uV/mm, display speed of 3mm/sec with a 60Hz  notched filter applied as appropriate. EEG data were recorded continuously and digitally stored.  Video  monitoring was available and reviewed as appropriate. Description: EEG showed continuous generalized 3 to 6 Hz theta-delta slowing. Patient was noted to have episodes of brief left hand movement in response to stimulation of foot or right arm.  Concomitant EEG before, during and after the event did not show any EEG changes suggest seizure. Hyperventilation and photic stimulation were not performed.   ABNORMALITY - Continuous slow, generalized IMPRESSION: This study is suggestive of severe diffuse encephalopathy, nonspecific etiology but likely related to sedation. No seizures or epileptiform discharges were seen throughout the recording. Patient was noted to have episodes of brief left hand movement in response to stimulation of foot or right arm without concomitant EEG change. This was most likely NOT an epileptic event. However, focal motor seizure may not be seen on scalp EEG. Clinical correlation is recommended. [name]   DG Abd 1 View Result Date: 06/28/2023 CLINICAL DATA:  Encounter for feeding tube placement EXAM: ABDOMEN - 1 VIEW COMPARISON:  Abdominal radiographs 01/20/2023 FINDINGS: Enteric tube tip at the gastroesophageal junction and side-port in the lower esophagus. Recommend advancement approximately 10 cm to ensure side port is within the stomach. IMPRESSION: Enteric tube tip at the gastroesophageal junction and side-port in the lower esophagus. Recommend advancement approximately 10 cm to ensure side port is within the stomach. Electronically Signed   By: Rozell Cornet M.D.   On: 06/28/2023 21:45   Portable Chest x-ray Result Date: 06/28/2023 CLINICAL DATA:  Intubation. EXAM: PORTABLE CHEST 1 VIEW COMPARISON:  Chest radiograph dated 06/23/2023. FINDINGS: Endotracheal tube approximately 4.5 cm above the carina. There is cardiomegaly with vascular congestion. Bibasilar atelectasis. Small right pleural effusion. No pneumothorax. No acute osseous pathology. IMPRESSION: 1. Endotracheal tube  above the carina. 2. Cardiomegaly with vascular congestion.  Bibasilar atelectasis. Electronically Signed   By: Angus Bark M.D.   On: 06/28/2023 14:08   CT ANGIO HEAD NECK W WO CM (CODE STROKE) Result Date: 06/28/2023 CLINICAL DATA:  89 year old male code stroke. 2021 left M1 occlusion treated with thrombectomy. Questionable recurrent left MCA cytotoxic edema on plain head CT today. EXAM: CT ANGIOGRAPHY HEAD AND NECK TECHNIQUE: Multidetector CT imaging of the head and neck was performed using the standard protocol during bolus administration of intravenous contrast. Multiplanar CT image reconstructions and MIPs were obtained to evaluate the vascular anatomy. Carotid stenosis measurements (when applicable) are obtained utilizing NASCET criteria, using the distal internal carotid diameter as the denominator. RADIATION DOSE REDUCTION: This exam was performed according to the departmental dose-optimization program which includes automated exposure control, adjustment of the mA and/or kV according to patient size and/or use of iterative reconstruction technique. CONTRAST:  Not specified. COMPARISON:  Head CT at 0917 hours.  Prior CTA 08/27/2019. FINDINGS: CTA NECK Skeleton: Cervical spine degeneration. No acute osseous abnormality identified. Upper chest: Small layering pleural effusions. Pulmonary arterial dominant contrast timing in the  mediastinum. Other neck: Nonvascular neck soft tissue spaces are within normal limits. Aortic arch: Calcified aortic atherosclerosis. Bovine arch configuration. Right carotid system: Tortuous brachiocephalic artery and proximal right CCA. Patent to the skull base with minimal atherosclerosis. Left carotid system: Bovine left CCA origin with minimal plaque and no stenosis. Soft and calcified plaque at the left ICA origin but patent left ICA to the skull base with no significant stenosis. Vertebral arteries: Chronically occluded proximal right vertebral artery. Reconstitution in  the V2 segment seen in 2021 is not apparent although contrast timing is earlier today. Proximal left subclavian artery atherosclerosis without stenosis. Calcified plaque at the left vertebral artery origin with moderate stenosis, remains patent. Left vertebral artery caliber stable since 2021, the vessel is patent to the skull base without additional stenosis. CTA HEAD Posterior circulation: Patent left V4 segment with mild atherosclerosis, no significant stenosis to the vertebrobasilar junction. Faint probably retrograde enhancement of chronically diminutive right V4 segment. Left PICA origin remains patent. Patent basilar artery without stenosis. Patent SCA and PCA origins. Posterior communicating arteries are diminutive or absent. Bilateral PCA branches are stable and within normal limits. Anterior circulation: Suboptimal arterial contrast timing. Both ICA siphons are patent. Right greater than left ICA siphon calcified atherosclerosis. Mild to moderate right ICA anterior genu and supraclinoid stenosis. No significant left ICA siphon stenosis. Patent carotid termini. Patent MCA and ACA origins. A1 segments are stable, the left is mildly dominant and the right is mildly irregular. ACA branch early contrast timing, stable since 2021. Right MCA M1 segment, right MCA bifurcation are patent. Right MCA branches appear stable since 2021. Recurrent left MCA M1 occlusion 5 mm distal to its origin on series 10 image 12 and series 9, image 18 with no reconstitution on these early phase images. Venous sinuses: Early contrast timing, not evaluated. Anatomic variants: Effectively dominant left vertebral artery, bovine arch. Review of the MIP images confirms the above findings Preliminary report of left MCA M1 ELVO was communicated to Dr. Cleone Dad at 9:32 am on 06/28/2023 by text page via the [facility] messaging system. IMPRESSION: 1. Positive for Recurrent Left MCA M1 Occlusion (5 mm distal to origin). This was communicated to Dr.  Cleone Dad at 9:32 am on 06/28/2023 by text page via the [facility] messaging system. 2. Otherwise largely stable CTA since 2021 when allowing for early arterial contrast timing today. Chronically occluded right vertebral artery. Moderate Left Vertebral Artery origin stenosis. Aortic Atherosclerosis (ICD10-I70.0). Electronically Signed   By: Marlise Simpers M.D.   On: 06/28/2023 09:38   CT HEAD CODE STROKE WO CONTRAST Result Date: 06/28/2023 CLINICAL DATA:  Code stroke. 89 year old male. History of 2021 left MCA M1 occlusion treated with thrombectomy. EXAM: CT HEAD WITHOUT CONTRAST TECHNIQUE: Contiguous axial images were obtained from the base of the skull through the vertex without intravenous contrast. RADIATION DOSE REDUCTION: This exam was performed according to the departmental dose-optimization program which includes automated exposure control, adjustment of the mA and/or kV according to patient size and/or use of iterative reconstruction technique. COMPARISON:  Brain MRI 08/28/2019.  Head CT 08/27/2019. FINDINGS: Brain: Chronic dense globus pallidus calcifications. Chronic posterior left basal ganglia ischemia with encephalomalacia. Less motion artifact than on the 2021 head CT. Right hemisphere and posterior fossa gray-white differentiation appears stable since 2021. But on series 3 image 21 there is possible acute cytotoxic edema in the left MCA Tara 4 toward Territory spanning the M4 through M6 segments. No intracranial hemorrhage or mass effect. No ventriculomegaly. Vascular: Calcified atherosclerosis at  the skull base. No suspicious intracranial vascular hyperdensity. Skull: Stable and intact.  No acute osseous abnormality identified. Sinuses/Orbits: Visualized paranasal sinuses and mastoids are stable and well aerated. Other: Leftward gaze. Chronic postoperative changes to both globes. Visualized scalp soft tissues are within normal limits. ASPECTS [facility] Stroke Program Early CT Score) Total score (0-10 with 10 being  normal): 7 (abnormal M4, M5, and M6) IMPRESSION: 1. Possible Acute cytotoxic edema Left MCA territory, ASPECTS 7. 2. No acute intracranial hemorrhage or mass effect. 3. The above was communicated to Dr. Cleone Dad at 9:29 am on 06/28/2023 by text page via the [facility] messaging system. 4. Chronic left basal ganglia infarct., globus pallidus calcifications, Calcified atherosclerosis at the skull base. Electronically Signed   By: Marlise Simpers M.D.   On: 06/28/2023 09:30    Labs:  CBC: Recent Labs    06/25/23 0355 06/26/23 0405 06/28/23 0913 06/28/23 1417 06/29/23 0458  WBC 5.5 5.6 8.1  --  5.0  HGB 14.3 14.4 13.1 12.2* 12.3*  HCT 43.6 44.5 40.2 36.0* 37.7*  PLT 152 131* 148*  --  165    COAGS: Recent Labs    09/08/22 1013 09/09/22 0436 06/23/23 2218 06/24/23 0700 06/28/23 0913 06/29/23 0458  INR 1.2 1.3*  --   --  1.9* 1.4*  APTT  --   --  27 106* 40* 33    BMP: Recent Labs    06/26/23 0405 06/27/23 0620 06/28/23 0913 06/28/23 1417 06/29/23 0458  NA 136 134* 131* 133* 133*  K 4.6 4.4 4.6 4.3 4.2  CL 104 104 99  --  102  CO2 26 23 21*  --  21*  GLUCOSE 135* 144* 169*  --  129*  BUN 24* 30* 44*  --  32*  CALCIUM  8.9 8.7* 8.7*  --  8.5*  CREATININE 1.76* 1.68* 2.26*  --  1.82*  GFRNONAA 37* 39* 27*  --  35*    LIVER FUNCTION TESTS: Recent Labs    06/23/23 1857 06/24/23 0329 06/28/23 0913 06/29/23 0458  BILITOT 0.6 0.5 2.1* 1.2  AST 19 26 24 20   ALT 11 16 20 17   ALKPHOS 107 106 81 80  PROT 6.4* 6.7 6.8 6.5  ALBUMIN  3.0* 3.2* 2.9* 2.3*    Assessment and Plan: 3 y male with CVA, found to have left M1 occlusion. S/p thrombectomy 06/28/23 by Dr. Alvira Josephs achieving a TICI 3 revascularization.   - L CFA puncture site w/o appreciable hematoma or pseudoaneurysm. L DP 1+ - Neurology leading care, MR pending - Please call IR for questions and concerns.     Electronically Signed: Darel Ebbs, PA-C 06/30/2023, 10:19 AM   I spent a total of 15 Minutes at the the patient's  bedside AND on the patient's hospital floor or unit, greater than 50% of which was counseling/coordinating care for left M1 thrombectomy follow up

## 2023-06-30 NOTE — Progress Notes (Signed)
 PT Cancellation Note  Patient Details Name: [name] MRN: [id number] DOB: 1933-03-28   Cancelled Treatment:    Reason Eval/Treat Not Completed: Patient not medically ready (intubated and sedated)  Verdia Glad, PT, DPT Acute Rehabilitation Services Office [phone]    Claria Crofts 06/30/2023, 7:45 AM

## 2023-06-30 NOTE — Progress Notes (Signed)
 SLP Cancellation Note  Patient Details Name: [name] MRN: [id number] DOB: 1933-04-18   Cancelled treatment:       Reason Eval/Treat Not Completed: Patient not medically ready, intubated and sedated. Will f/u.    Amil Kale, M.A., CCC-SLP Speech Language Pathology, Acute Rehabilitation Services  Secure Chat preferred [phone]  06/30/2023, 10:49 AM

## 2023-06-30 NOTE — Procedures (Signed)
 Extubation Procedure Note  Patient Details:   Name: [name] DOB: 05-12-1933 MRN: [id number]   Airway Documentation:    Vent end date: 06/30/23 Vent end time: 1245   Evaluation  O2 sats: stable throughout Complications: No apparent complications Patient did tolerate procedure well. Bilateral Breath Sounds: Clear, Diminished   No  Pt extubated per MD order, placed on 3L [place]. Positive cuff leak noted, no stridor heard post extubation. RT will continue to monitor.   Morrell Aran 06/30/2023, 12:49 PM

## 2023-06-30 NOTE — TOC CAGE-AID Note (Signed)
 Transition of Care [facility]) - CAGE-AID Screening   Patient Details  Name: [name] MRN: [id number] Date of Birth: 1933-02-24  [facility] of [facility] CM/SW Contact:    Nesa Distel E Tanea Moga, LCSW Phone Number: 06/30/2023, 11:28 AM   Clinical Narrative:    CAGE-AID Screening: Substance Abuse Screening unable to be completed due to: : Patient unable to participate

## 2023-06-30 NOTE — Progress Notes (Signed)
 eLink Physician-Brief Progress Note Patient Name: [name] DOB: 12/03/1933 MRN: [id number]   Date of Service  06/30/2023  HPI/Events of Note  Bedside RN reaching out because the patient was extubated today, so he no longer has an OG tube. He is not alert enough to pass a swallow evaluation yet. She is checking to see if it is okay for the patient to miss his verapamil  tonight? Otherwise she would have to place an NG tube. He is currently on an Amio drip.   Camera: VS stable. Resting. Sats good.   eICU Interventions  Ok to hold verapamil  for now. Watch for tachy arhythmia, afib rate.       Intervention Category Intermediate Interventions: Other:  Rexann Catalan 06/30/2023, 9:34 PM

## 2023-06-30 NOTE — Progress Notes (Signed)
 NAME:  [name], MRN:  [id number], DOB:  1933/08/12, LOS: 2 ADMISSION DATE:  06/28/2023, CONSULTATION DATE:  06/30/2023  REFERRING MD:  Alvira Josephs, neuro IR, CHIEF COMPLAINT: Right-sided weakness  History of Present Illness:  89 year old man who was recently admitted 6/2-06/27/2023 with hypotension, altered mental status, monomorphic V. tach requiring DCCV.  LHC did not show obstructive CAD.  He was discharged on verapamil . LKW was 0800 on 6/7.  He developed right-sided weakness and was noted to have right facial droop and aphasia on arrival to [facility] ED, initial saturation was 85% improved to 95% on 2 L Head CT showed hyperdense sign with aspects 7 CT angio confirmed left M1 occlusion. He was not given TNK due to DOAC last dose night before admission He underwent aspiration thrombectomy of occluded left MCA proximal M1 segment, required Neo-Synephrine during the procedure and brought to PACU intubated.  PCCM consulted   Pertinent  Medical History  Afib on edoxaban ,  CHF,  left MCA CVA in 2021 s/p thrombectomy with no residual,  Diabetes -2,  HTN, HLD,  OSA on CPAP,  hard of hearing,  CKD IIIb,  prostate Ca   Significant Hospital Events: Including procedures, antibiotic start and stop dates in addition to other pertinent events   6/8 MRI could not be done due to PPM, head CT showed evolving left MCA infarct with mild edema, petechial hemorrhage in left temporal lobe 6/8 Amio drip, cardiology consult  Interim History / Subjective:  Critically ill, intubated and sedated on propofol  Afebrile PVCs continue, on Amio drip Off Neo-Synephrine    Objective    Blood pressure 136/81, pulse 74, temperature 99 F (37.2 C), temperature source Axillary, resp. rate 16, height 6\' 1"  (1.854 m), weight 107.5 kg, SpO2 100%.    Vent Mode: PRVC FiO2 (%):  [40 %] 40 % Set Rate:  [16 bmp] 16 bmp Vt Set:  [409 mL] 620 mL PEEP:  [5 cmH20] 5 cmH20 Plateau Pressure:  [17 cmH20-22 cmH20] 20 cmH20    Intake/Output Summary (Last 24 hours) at 06/30/2023 0902 Last data filed at 06/30/2023 0800 Gross per 24 hour  Intake 2834.83 ml  Output 900 ml  Net 1934.83 ml   Filed Weights   06/28/23 1700  Weight: 107.5 kg    Examination: General: Elderly, obese man, intubated, no distress HENT: Pinpoint pupils, no icterus, no JVD Lungs: No accessory muscle use, clear breath sounds bilateral Cardiovascular: S1-S2 no murmur Abdomen: Soft, nontender, obese Extremities: 1+ edema Neuro: Sedate, RASS -2, intermittent agitation per RN, decreased movement on right   Labs pending Chest x-ray shows cardiomegaly with vascular congestion, ET tube balloon over distended  Resolved problem list   Hypotension     Assessment and Plan    Acute ischemic stroke -left MCA status post aspiration lobectomy EEG negative Neurology holding off DOAC due to infarct size Changed to Precedex with goal RASS 0 to -1  Acute respiratory failure with hypoxia -he was hypoxic to 85% on arrival to ED Previous spirometry 2022 has shown mild restriction, likely related to obesity On edoxaban  so-PE is not a consideration  -Start spontaneous breathing trials  with plan for extubation  History of V. Tach requiring DCCV 06/2023- -continue  amiodarone  drip - Resume verapamil  at 40 every 8 - Cardiology following   SSS s/p micra PPM Permanent atrial fibrillation -resume edoxaban  when okay with neurology HFpEF -new mildly reduced EF 45 to 50%, recent LHC 6/3 no significant CAD -Resume Farxiga  and Lasix  when able -lasix   20 x 1 today  AKI on CKD -monitor renal function and output -Appears stable  Stage IV metastatic prostate cancer -on hormonal therapy  Best Practice (right click and "Reselect all SmartList Selections" daily)   Diet/type: NPO DVT prophylaxis DOAC Pressure ulcer(s): N/A GI prophylaxis: N/A Lines: N/A Foley:  N/A Code Status:  full code Last date of multidisciplinary goals of care discussion  [NA]  Labs   CBC: Recent Labs  Lab 06/23/23 1857 06/24/23 0329 06/25/23 0355 06/26/23 0405 06/28/23 0913 06/28/23 1417 06/29/23 0458  WBC 5.4 4.9 5.5 5.6 8.1  --  5.0  NEUTROABS 2.4  --   --   --  5.4  --  3.6  HGB 14.0 14.7 14.3 14.4 13.1 12.2* 12.3*  HCT 43.2 45.7 43.6 44.5 40.2 36.0* 37.7*  MCV 96.0 96.0 94.4 94.9 94.6  --  94.0  PLT 166 154 152 131* 148*  --  165    Basic Metabolic Panel: Recent Labs  Lab 06/23/23 1857 06/24/23 0329 06/25/23 0355 06/26/23 0405 06/27/23 0620 06/28/23 0913 06/28/23 1417 06/28/23 2153 06/29/23 0458  NA 140 142 138 136 134* 131* 133*  --  133*  K 3.7 3.7 3.8 4.6 4.4 4.6 4.3  --  4.2  CL 110 106 105 104 104 99  --   --  102  CO2 19* 29 27 26 23  21*  --   --  21*  GLUCOSE 175* 112* 131* 135* 144* 169*  --   --  129*  BUN 28* 28* 22 24* 30* 44*  --   --  32*  CREATININE 2.00* 1.83* 1.69* 1.76* 1.68* 2.26*  --   --  1.82*  CALCIUM  8.5* 9.1 9.1 8.9 8.7* 8.7*  --   --  8.5*  MG 2.0 2.1 2.0 2.4  --   --   --  2.1  --    GFR: Estimated Creatinine Clearance: 36.1 mL/min (A) (by C-G formula based on SCr of 1.82 mg/dL (H)). Recent Labs  Lab 06/25/23 0355 06/26/23 0405 06/28/23 0913 06/29/23 0458  WBC 5.5 5.6 8.1 5.0    Liver Function Tests: Recent Labs  Lab 06/23/23 1857 06/24/23 0329 06/28/23 0913 06/29/23 0458  AST 19 26 24 20   ALT 11 16 20 17   ALKPHOS 107 106 81 80  BILITOT 0.6 0.5 2.1* 1.2  PROT 6.4* 6.7 6.8 6.5  ALBUMIN  3.0* 3.2* 2.9* 2.3*   No results for input(s): "LIPASE", "AMYLASE" in the last 168 hours. No results for input(s): "AMMONIA" in the last 168 hours.  ABG    Component Value Date/Time   PHART 7.452 (H) 06/28/2023 1417   PCO2ART 31.0 (L) 06/28/2023 1417   PO2ART 112 (H) 06/28/2023 1417   HCO3 21.8 06/28/2023 1417   TCO2 23 06/28/2023 1417   ACIDBASEDEF 2.0 06/28/2023 1417   O2SAT 99 06/28/2023 1417     Coagulation Profile: Recent Labs  Lab 06/28/23 0913 06/29/23 0458  INR 1.9* 1.4*     Cardiac Enzymes: No results for input(s): "CKTOTAL", "CKMB", "CKMBINDEX", "TROPONINI" in the last 168 hours.  HbA1C: Hemoglobin A1C  Date/Time Value Ref Range Status  03/10/2023 03:37 PM 8.7 (A) 4.0 - 5.6 % Final  10/04/2022 10:45 AM 7.5 (A) 4.0 - 5.6 % Final   Hgb A1c MFr Bld  Date/Time Value Ref Range Status  08/28/2019 12:58 PM 6.4 (H) 4.8 - 5.6 % Final    Comment:    (NOTE) Pre diabetes:  5.7%-6.4%  Diabetes:              >6.4%  Glycemic control for   <7.0% adults with diabetes   06/30/2019 11:32 PM 7.8 (H) 4.8 - 5.6 % Final    Comment:    (NOTE) Pre diabetes:          5.7%-6.4%  Diabetes:              >6.4%  Glycemic control for   <7.0% adults with diabetes     CBG: Recent Labs  Lab 06/29/23 1546 06/29/23 1933 06/29/23 2323 06/30/23 0320 06/30/23 0723  GLUCAP 125* 148* 124* 122* 147*    Critical care time: 33 m     Celene Coins MD. FCCP. [facility] Pager : [phone]  If no response to pager , please call [phone] until 7 pm After 7:00 pm call Elink  [phone]   06/30/2023

## 2023-06-30 NOTE — Progress Notes (Signed)
 OT Cancellation Note  Patient Details Name: [name] MRN: [id number] DOB: 08-03-1933   Cancelled Treatment:    Reason Eval/Treat Not Completed: Patient not medically ready Intubated and sedated.   Karilyn Ouch, OTR/L [facility] Office: [phone]   [name] 06/30/2023, 7:51 AM

## 2023-06-30 NOTE — Progress Notes (Addendum)
 STROKE TEAM PROGRESS NOTE    SIGNIFICANT HOSPITAL EVENTS 6/7-patient admitted, taken to interventional radiology with successful revascularization of left MCA M1 segment  INTERIM HISTORY/SUBJECTIVE Family arrived at the bedside during exam.  RN at the bedside Remains intubated he is on Precedex at 0.2 mcg.  He is also on Amio 30 Mg Neurologically exam remains the same  MRI brain today  CBC    Component Value Date/Time   WBC 5.0 06/29/2023 0458   RBC 4.01 (L) 06/29/2023 0458   HGB 12.3 (L) 06/29/2023 0458   HGB 15.2 04/29/2023 1259   HGB 14.6 06/11/2019 0953   HCT 37.7 (L) 06/29/2023 0458   HCT 46.0 06/11/2019 0953   PLT 165 06/29/2023 0458   PLT 189 04/29/2023 1259   PLT 154 06/11/2019 0953   MCV 94.0 06/29/2023 0458   MCV 94 06/11/2019 0953   MCV 92 10/13/2013 0415   MCH 30.7 06/29/2023 0458   MCHC 32.6 06/29/2023 0458   RDW 14.7 06/29/2023 0458   RDW 13.3 06/11/2019 0953   RDW 15.3 (H) 10/13/2013 0415   LYMPHSABS 0.3 (L) 06/29/2023 0458   LYMPHSABS 2.4 10/13/2013 0415   MONOABS 1.0 06/29/2023 0458   MONOABS 0.8 10/13/2013 0415   EOSABS 0.1 06/29/2023 0458   EOSABS 0.2 10/13/2013 0415   BASOSABS 0.0 06/29/2023 0458   BASOSABS 0.0 10/13/2013 0415    BMET    Component Value Date/Time   NA 134 (L) 06/30/2023 0940   NA 144 10/11/2021 0936   NA 138 10/13/2013 0415   K 4.0 06/30/2023 0940   K 4.0 10/13/2013 0415   CL 105 06/30/2023 0940   CL 106 10/13/2013 0415   CO2 21 (L) 06/30/2023 0940   CO2 27 10/13/2013 0415   GLUCOSE 250 (H) 06/30/2023 0940   GLUCOSE 97 10/13/2013 0415   BUN 24 (H) 06/30/2023 0940   BUN 46 (H) 10/11/2021 0936   BUN 22 (H) 10/13/2013 0415   CREATININE 1.33 (H) 06/30/2023 0940   CREATININE 2.17 (H) 04/29/2023 1259   CREATININE 1.65 (H) 10/13/2013 0415   CALCIUM  7.7 (L) 06/30/2023 0940   CALCIUM  8.2 (L) 10/13/2013 0415   EGFR 22 (L) 10/11/2021 0936   GFRNONAA 51 (L) 06/30/2023 0940   GFRNONAA 29 (L) 04/29/2023 1259   GFRNONAA 39 (L)  10/13/2013 0415    IMAGING past 24 hours DG CHEST PORT 1 VIEW Result Date: 06/29/2023 CLINICAL DATA:  OG tube placement EXAM: PORTABLE CHEST 1 VIEW COMPARISON:  Radiograph 06/28/2023 FINDINGS: Endotracheal tube tip in the intrathoracic trachea 2.3 cm from the carina. The balloon in the upper trachea measures 3.1 cm in diameter. The tip of the enteric tube is below the inferior aspect of the image below the diaphragm. Stable cardiomegaly. Pulmonary vascular congestion. Bibasilar atelectasis and probable small right pleural effusion. No pneumothorax. No displaced rib fractures. IMPRESSION: 1. Endotracheal tube tip in the intrathoracic trachea 2.3 cm from the carina. Question overinflation of the tracheal balloon. The balloon in the upper trachea measures 3.1 cm in diameter. 2. The tip of the enteric tube is below the inferior aspect of the image below the diaphragm. 3. Cardiomegaly with pulmonary vascular congestion. 4. Bibasilar atelectasis and probable small right pleural effusion. Electronically Signed   By: Rozell Cornet M.D.   On: 06/29/2023 19:18   CT HEAD WO CONTRAST ( ) Result Date: 06/29/2023 CLINICAL DATA:  Stroke follow-up, acute left MCA territory infarct. EXAM: CT HEAD WITHOUT CONTRAST TECHNIQUE: Contiguous axial images were obtained from the base  of the skull through the vertex without intravenous contrast. RADIATION DOSE REDUCTION: This exam was performed according to the departmental dose-optimization program which includes automated exposure control, adjustment of the mA and/or kV according to patient size and/or use of iterative reconstruction technique. COMPARISON:  CT head and CTA head and neck 07/28/2023. FINDINGS: Brain: Evolving left MCA territory infarct with decreased attenuation within the left temporal lobe, left frontal operculum, left insular cortex, as well as the left basal ganglia particularly the caudate head and lentiform nucleus. There is mild associated edema and sulcal  effacement within this region without midline shift. Few faint areas of hyperattenuation within the posterior temporal lobe suggestive of developing petechial hemorrhage. No parenchymal hematoma. Basilar cisterns are patent. No extra-axial fluid collection. Ventricles: The ventricles are normal. Vascular: Atherosclerotic calcifications at the skull base involving the carotid siphons more so on the right. There is mild density involving the distal aspect of the left M1 segment extending into left M2 branches. Skull: No acute or aggressive finding. Orbits: Bilateral lens replacement. Sinuses: No significant mucosal thickening or air-fluid level in the visualized paranasal sinuses. Other: Trace fluid in the left mastoid air cells. Partially visualized nasoenteric tube coiled within the nasopharynx. IMPRESSION: Evolving left MCA territory infarct with mild edema and sulcal effacement. No midline shift. Foci of hyperattenuation in the posterior left temporal lobe suggestive of developing petechial hemorrhage. No evidence of parenchymal hematoma. Hyperdense appearance of the distal left M1 segment and left M2 branches corresponding to findings of occlusion on CTA. Partially visualized enteric tube in the nasopharynx. Recommend radiographic correlation to evaluate tube positioning. Electronically Signed   By: Denny Flack M.D.   On: 06/29/2023 18:34   EEG adult Result Date: 06/29/2023 [name], MD     06/29/2023  1:41 PM Patient Name: [name] MRN: [id number] Epilepsy Attending: [name] Referring Physician/Provider: Colon Dear, NP Date: 06/29/2023 Duration: Patient history: 89yo m with left M1 stoke now with facial and left arm twitching. EEG to evaluate for seizure Level of alertness: comatose AEDs during EEG study: Propofol  Technical aspects: This EEG study was done with scalp electrodes positioned according to the 10-20 International system of electrode placement. Electrical activity  was reviewed with band pass filter of 1-70Hz , sensitivity of 7 uV/mm, display speed of 63mm/sec with a 60Hz  notched filter applied as appropriate. EEG data were recorded continuously and digitally stored.  Video monitoring was available and reviewed as appropriate. Description: EEG showed continuous generalized 3 to 6 Hz theta-delta slowing. Patient was noted to have episodes of brief left hand movement in response to stimulation of foot or right arm.  Concomitant EEG before, during and after the event did not show any EEG changes suggest seizure. Hyperventilation and photic stimulation were not performed.   ABNORMALITY - Continuous slow, generalized IMPRESSION: This study is suggestive of severe diffuse encephalopathy, nonspecific etiology but likely related to sedation. No seizures or epileptiform discharges were seen throughout the recording. Patient was noted to have episodes of brief left hand movement in response to stimulation of foot or right arm without concomitant EEG change. This was most likely NOT an epileptic event. However, focal motor seizure may not be seen on scalp EEG. Clinical correlation is recommended. [name]    Vitals:   06/30/23 1215 06/30/23 1230 06/30/23 1245 06/30/23 1248  BP: (!) 143/89 137/86 (!) 142/91   Pulse: 70 79 (!) 102 85  Resp: (!) 22 (!) 21 17 (!) 25  Temp:      TempSrc:      SpO2: 98% 99% 100% 96%  Weight:      Height:         PHYSICAL EXAM General: Intubated elderly patient in no acute distress Psych:  Mood and affect appropriate for situation CV: A-fib on the monitor Respiratory: Respirations synchronous with ventilator  NEURO (sedation with propofol  paused): Pupils equal round and reactive to light, no oculocephalic reflex, corneal reflexes present bilaterally but weak, cough reflex present, no movement of right upper extremity, moves bilateral lower extremities and left upper extremity spontaneously and withdraws left upper extremity to  noxious    ASSESSMENT/PLAN  Mr. ELIAB CLOSSON is a 89 y.o. male with history of A-fib on edoxaban  (switched to edoxaban  as Eliquis  interacted with prostate cancer treatment), CHF, left MCA stroke in 2021 status post thrombectomy, diabetes, hypertension, hyperlipidemia, sleep apnea on CPAP, CKD stage IIIb and prostate cancer on Xtandi  and leuprolide  admitted for acute onset right facial droop, right-sided weakness and altered mental status.  He was found to have left MCA M1 occlusion and was taken to interventional radiology for successful mechanical thrombectomy.  He remains intubated after the procedure.  NIH on Admission 26  Stroke:  left MCA infarct with left M1 occlusion s/p IR with TICI3, etiology likely A-fib on DOAC in the setting of recent cardioversion Code Stroke CT head stable acute cytotoxic edema in left MCA territory ASPECTS 7 CTA head & neck recurrent left MCA M1 occlusion, left VA moderate stenosis at origin, right VA occlusion Status post IR with TICI3 Follow-up CT Evolving left MCA territory infarct with mild edema and sulcal  effacement. No midline shift. Foci of hyperattenuation in the posterior left temporal lobe suggestive of developing petechial hemorrhage. Hyperdense appearance of the distal left M1 segment and left M2  MRI and MRA pending 2D Echo EF 45 to 50% LDL 32 HgbA1c 8.7 VTE prophylaxis -heparin  subcu aspirin  81 mg daily and edoxaban  prior to admission, now on aspirin  325 versus PR 300.  Hold off DOAC for now given infarct size Therapy recommendations: pending Disposition: Pending  Hx of Stroke/TIA 08/2019 admitted for left BG/CR and caudate infarct.  CTA head and neck left M1 occlusion, right MCA occlusion.  CTP 0/98 cc.  S/p IR with TICI3.  MRI post IR showed left M1 patent.  EF 60 to 65%.  LDL 27, A1c 7.8.  Discharged on aspirin  325 and Lipitor 10.  Recommend discussed with patient cardiologist regarding DOAC.  Atrial fibrillation Was on pradaxa , then  changed to eliquis  - ? Due to rectal bleeding ? AC was held during 06/2019 [facility] admission due to rectal bleeding and high-risk of recurrent bleeding.  CHADS-VASC score of 5 at that time, with 7.2% risk of stroke per year, but need to weigh risk of stroke versus risk of recurrent GI bleeding with Eliquis . At the time of discharge, a mutual decision was made with patient, family, and medical team to not restart anticoagulation for fear of recurrence of life-threatening bleeding Stroke in 08/2019 as above, on discharge patient and family reluctant to start DOAC, therefore they were recommended to discuss with his cardiologist regarding DOAC as outpatient. Current home meds including amiodarone  200 mg daily, verapamil  180 mg daily, edoxaban  30 mg daily Had cardiac cath and cardioversion 2 days ago Still in afib RVR with pacing, continue telemetry On home amiodarone  and verapamil  Hold all DOAC for now given infarct size On aspirin   Seizure-like activity During round, with  stimulation, patient had left facial, left arm and leg low amplitude twitching EEG no seizure Likely stimulation induced Continue monitoring  Hypertension Home meds: Verapamil  Stable BP goal less than 180/105 Long-term BP goal normotensive  Hyperlipidemia Home meds: Atorvastatin  10 mg daily LDL 32, goal < 70 On Lipitor 10 High intensity statin not indicated as LDL below goal Continue statin at discharge  Diabetes type II poorly controlled Home meds:  Farxiga  10 mg daily HgbA1c 8.7, goal < 7.0 CBGs SSI Recommend close follow-up with PCP for better DM control  Respiratory failure Patient left intubated after thrombectomy Ventilator management per CCM Failed SBT this am Wean and extubate when able  Dysphagia Patient has post-stroke dysphagia SLP consulted Now n.p.o. Advance diet as tolerated  Other Stroke Risk Factors Obesity, Body mass index is 31.27 kg/m., BMI >/= 30 associated with increased stroke risk,  recommend weight loss, diet and exercise as appropriate  Obstructive sleep apnea, on CPAP at home CHF  Other Active Problems Prostate cancer-plan to resume home Xtandi  when patient able to take p.o. medications History of rectal bleeding CKD 3B, creatinine 2.26--1.82-1.33  Hospital day # 2  Patient seen by NP with MD, MD to edit note as needed.   Jonette Nestle DNP, ACNPC-AG  [facility]  ATTENDING NOTE: I reviewed above note and agree with the assessment and plan. Pt was seen and examined.   Daughter is at the bedside. Pt still intubated and need sedation for severe gag and cough. But failed SBT this am. Discussed with Dr Villa Greaser will try again extubation in the pm. Pending MRI brain, given the size of infarct, will hold off AC at this time. Continue ASA. Appreciate CCM assistance. I had long discussion with daughter at bedside, updated pt current condition, treatment plan and potential prognosis, and answered all the questions. She expressed understanding and appreciation.   For detailed assessment and plan, please refer to above as I have made changes wherever appropriate.   Consuelo Denmark, MD PhD Stroke Neurology 06/30/2023 10:30 PM  This patient is critically ill due to left MCA stroke status post IR, A-fib RVR, respite 3 failure and at significant risk of neurological worsening, death form recurrent stroke, hemorrhagic termination, heart failure and renal failure. This patient's care requires constant monitoring of vital signs, hemodynamics, respiratory and cardiac monitoring, review of multiple databases, neurological assessment, discussion with family, other specialists and medical decision making of high complexity. I spent 30 minutes of neurocritical care time in the care of this patient.  I discussed with CCM Dr. Villa Greaser     To contact Stroke Continuity provider, please refer to [email or web address]. After hours, contact General Neurology

## 2023-06-30 NOTE — Plan of Care (Signed)
  Problem: Ischemic Stroke/TIA Tissue Perfusion: Goal: Complications of ischemic stroke/TIA will be minimized Outcome: Progressing   Problem: Coping: Goal: Will identify appropriate support needs Outcome: Progressing   Problem: Health Behavior/Discharge Planning: Goal: Ability to manage health-related needs will improve Outcome: Progressing Goal: Goals will be collaboratively established with patient/family Outcome: Progressing   Problem: Self-Care: Goal: Ability to participate in self-care as condition permits will improve Outcome: Progressing   Problem: Nutrition: Goal: Risk of aspiration will decrease Outcome: Progressing

## 2023-07-01 DIAGNOSIS — N184 Chronic kidney disease, stage 4 (severe): Secondary | ICD-10-CM

## 2023-07-01 DIAGNOSIS — Z7984 Long term (current) use of oral hypoglycemic drugs: Secondary | ICD-10-CM

## 2023-07-01 DIAGNOSIS — I4891 Unspecified atrial fibrillation: Secondary | ICD-10-CM | POA: Diagnosis not present

## 2023-07-01 DIAGNOSIS — I6602 Occlusion and stenosis of left middle cerebral artery: Secondary | ICD-10-CM

## 2023-07-01 DIAGNOSIS — I130 Hypertensive heart and chronic kidney disease with heart failure and stage 1 through stage 4 chronic kidney disease, or unspecified chronic kidney disease: Secondary | ICD-10-CM | POA: Diagnosis not present

## 2023-07-01 DIAGNOSIS — I5022 Chronic systolic (congestive) heart failure: Secondary | ICD-10-CM

## 2023-07-01 DIAGNOSIS — I63512 Cerebral infarction due to unspecified occlusion or stenosis of left middle cerebral artery: Secondary | ICD-10-CM | POA: Diagnosis not present

## 2023-07-01 DIAGNOSIS — C61 Malignant neoplasm of prostate: Secondary | ICD-10-CM | POA: Diagnosis not present

## 2023-07-01 DIAGNOSIS — R569 Unspecified convulsions: Secondary | ICD-10-CM | POA: Diagnosis not present

## 2023-07-01 DIAGNOSIS — I69391 Dysphagia following cerebral infarction: Secondary | ICD-10-CM | POA: Diagnosis not present

## 2023-07-01 DIAGNOSIS — I4821 Permanent atrial fibrillation: Secondary | ICD-10-CM | POA: Diagnosis not present

## 2023-07-01 DIAGNOSIS — E1165 Type 2 diabetes mellitus with hyperglycemia: Secondary | ICD-10-CM

## 2023-07-01 LAB — GLUCOSE, CAPILLARY
Glucose-Capillary: 114 mg/dL — ABNORMAL HIGH (ref 70–99)
Glucose-Capillary: 109 mg/dL — ABNORMAL HIGH (ref 70–99)
Glucose-Capillary: 119 mg/dL — ABNORMAL HIGH (ref 70–99)
Glucose-Capillary: 103 mg/dL — ABNORMAL HIGH (ref 70–99)
Glucose-Capillary: 115 mg/dL — ABNORMAL HIGH (ref 70–99)
Glucose-Capillary: 112 mg/dL — ABNORMAL HIGH (ref 70–99)

## 2023-07-01 LAB — BASIC METABOLIC PANEL WITH GFR
Anion gap: 10 (ref 5–15)
BUN: 25 mg/dL — ABNORMAL HIGH (ref 8–23)
CO2: 22 mmol/L (ref 22–32)
Calcium: 8.4 mg/dL — ABNORMAL LOW (ref 8.9–10.3)
Chloride: 105 mmol/L (ref 98–111)
Creatinine, Ser: 1.47 mg/dL — ABNORMAL HIGH (ref 0.61–1.24)
GFR, Estimated: 46 mL/min — ABNORMAL LOW (ref >60)
Glucose, Bld: 131 mg/dL — ABNORMAL HIGH (ref 70–99)
Potassium: 3.8 mmol/L (ref 3.5–5.1)
Sodium: 137 mmol/L (ref 135–145)

## 2023-07-01 LAB — MAGNESIUM: Magnesium: 2.1 mg/dL (ref 1.7–2.4)

## 2023-07-01 LAB — PHOSPHORUS: Phosphorus: 2.6 mg/dL (ref 2.5–4.6)

## 2023-07-01 MED ORDER — ATORVASTATIN CALCIUM 10 MG PO TABS
10.0000 mg | ORAL_TABLET | Freq: Every day | ORAL | Status: DC
  Administered 2023-07-01 – 2023-07-11 (×11): 10 mg via ORAL
  Filled 2023-07-01 (×12): qty 1

## 2023-07-01 MED ORDER — AMIODARONE HCL 200 MG PO TABS
400.0000 mg | ORAL_TABLET | Freq: Two times a day (BID) | ORAL | Status: AC
  Administered 2023-07-01 – 2023-07-02 (×4): 400 mg via ORAL
  Filled 2023-07-01 (×4): qty 2

## 2023-07-01 MED ORDER — FAMOTIDINE 20 MG PO TABS
20.0000 mg | ORAL_TABLET | Freq: Every day | ORAL | Status: DC
  Administered 2023-07-01 – 2023-07-11 (×11): 20 mg via ORAL
  Filled 2023-07-01 (×11): qty 1

## 2023-07-01 MED ORDER — AMIODARONE HCL 200 MG PO TABS
400.0000 mg | ORAL_TABLET | Freq: Every day | ORAL | Status: AC
  Administered 2023-07-03 – 2023-07-07 (×5): 400 mg via ORAL
  Filled 2023-07-01 (×5): qty 2

## 2023-07-01 MED ORDER — DOCUSATE SODIUM 50 MG/5ML PO LIQD: 100.0000 mg | Freq: Two times a day (BID) | ORAL | Status: DC | PRN

## 2023-07-01 MED ORDER — POLYETHYLENE GLYCOL 3350 17 G PO PACK
17.0000 g | PACK | Freq: Every day | ORAL | Status: DC
  Administered 2023-07-03 – 2023-07-11 (×8): 17 g via ORAL
  Filled 2023-07-01 (×9): qty 1

## 2023-07-01 MED ORDER — AMIODARONE HCL 200 MG PO TABS
200.0000 mg | ORAL_TABLET | Freq: Every day | ORAL | Status: DC
  Administered 2023-07-08 – 2023-07-11 (×4): 200 mg via ORAL
  Filled 2023-07-01 (×4): qty 1

## 2023-07-01 NOTE — Progress Notes (Signed)
 eLink Physician-Brief Progress Note Patient Name: [name] DOB: Jan 07, 1934 MRN: [id number]   Date of Service  07/01/2023  HPI/Events of Note  Bedside RN reports that patient has not voided and has 450 cc on bladder scan. Requesting In and out cath order.   eICU Interventions  Straight cath ordered once     Intervention Category Minor Interventions: Other:  Rexann Catalan 07/01/2023, 1:04 AM

## 2023-07-01 NOTE — Evaluation (Signed)
 Clinical/Bedside Swallow Evaluation Patient Details  Name: [name] MRN: [id number] Date of Birth: 1933-10-26  Today's Date: 07/01/2023 Time: SLP Start Time (ACUTE ONLY): 4098 SLP Stop Time (ACUTE ONLY): 1000 SLP Time Calculation (min) (ACUTE ONLY): 37 min  Past Medical History:  Past Medical History:  Diagnosis Date   Acute deep vein thrombosis (DVT) of femoral vein of left lower extremity (HCC)    Atrial fibrillation (HCC)    CHF (congestive heart failure) (HCC)    Chronic kidney disease    Diabetes (HCC)    Hearing loss    Hyperlipidemia    Liver cyst    Nephrolithiasis    OSA (obstructive sleep apnea)    Prostate cancer (HCC)    SSS (sick sinus syndrome) (HCC)    Stroke (HCC)    Valvular heart disease    Past Surgical History:  Past Surgical History:  Procedure Laterality Date   CATARACT EXTRACTION     COLONOSCOPY WITH PROPOFOL  N/A 07/02/2019   Procedure: COLONOSCOPY WITH PROPOFOL ;  Surgeon: Selena Daily, MD;  Location: [facility] ENDOSCOPY;  Service: Gastroenterology;  Laterality: N/A;   ESOPHAGOGASTRODUODENOSCOPY (EGD) WITH PROPOFOL  N/A 03/19/2022   Procedure: ESOPHAGOGASTRODUODENOSCOPY (EGD) WITH PROPOFOL ;  Surgeon: Luke Salaam, MD;  Location: [facility] ENDOSCOPY;  Service: Gastroenterology;  Laterality: N/A;   IR CT HEAD LTD  08/27/2019   IR CT HEAD LTD  06/28/2023   IR PERCUTANEOUS ART THROMBECTOMY/INFUSION INTRACRANIAL INC DIAG ANGIO  08/27/2019       IR PERCUTANEOUS ART THROMBECTOMY/INFUSION INTRACRANIAL INC DIAG ANGIO  08/27/2019   IR PERCUTANEOUS ART THROMBECTOMY/INFUSION INTRACRANIAL INC DIAG ANGIO  06/28/2023   LEFT HEART CATH AND CORONARY ANGIOGRAPHY N/A 06/24/2023   Procedure: LEFT HEART CATH AND CORONARY ANGIOGRAPHY;  Surgeon: Percival Brace, MD;  Location: [facility] INVASIVE CV LAB;  Service: Cardiovascular;  Laterality: N/A;   LOWER EXTREMITY ANGIOGRAPHY Left 12/25/2020   Procedure: Lower Extremity Angiography;  Surgeon: Celso College, MD;  Location: [facility] INVASIVE  CV LAB;  Service: Cardiovascular;  Laterality: Left;   PACEMAKER LEADLESS INSERTION N/A 12/19/2020   Procedure: PACEMAKER LEADLESS INSERTION;  Surgeon: Percival Brace, MD;  Location: [facility] INVASIVE CV LAB;  Service: Cardiovascular;  Laterality: N/A;   PROSTATE CANCER     PROSTATE SURGERY     RADIOLOGY WITH ANESTHESIA N/A 08/27/2019   Procedure: IR WITH ANESTHESIA;  Surgeon: Luellen Sages, MD;  Location: [facility] OR;  Service: Radiology;  Laterality: N/A;   RADIOLOGY WITH ANESTHESIA N/A 06/28/2023   Procedure: RADIOLOGY WITH ANESTHESIA;  Surgeon: Radiologist, Medication, MD;  Location: [facility] OR;  Service: Radiology;  Laterality: N/A;   HPI:  [name] is a 89 y.o. male with admitted for acute onset right facial droop, right-sided weakness and altered mental status.  He was found to have left MCA M1 occlusion and was taken to interventional radiology for successful mechanical thrombectomy.Intubated 6/7-6/9. history of A-fib on edoxaban  (switched to edoxaban  as Eliquis  interacted with prostate cancer treatment), CHF, left MCA stroke in 2021 status post thrombectomy, diabetes, hypertension, hyperlipidemia, sleep apnea on CPAP, CKD stage IIIb and prostate cancer on Xtandi  and leuprolide     Assessment / Plan / Recommendation  Clinical Impression  Pt demonstrates potential for dysphagia given coughing after consecutive swallows of water. He did tolerate purees well and fed himself with assist. No signs of wet vocal quality, multiple swallows or coughing. Given need for oral medication, pt can have meds inp uree today; SLP will f/u for MBS in am tomorrow given potential for good  tolerance of a modified diet. If pt does not do well on MBS COrtrak warranted. SLP Visit Diagnosis: Dysphagia, unspecified (R13.10)    Aspiration Risk       Diet Recommendation NPO except meds    Medication Administration: Crushed with puree Supervision: Staff to assist with self feeding Postural Changes: Seated  upright at 90 degrees    Other  Recommendations       Assistance Recommended at Discharge Frequent or constant Supervision/Assistance  Functional Status Assessment Patient has had a recent decline in their functional status and demonstrates the ability to make significant improvements in function in a reasonable and predictable amount of time.  Frequency and Duration min 2x/week          Prognosis Prognosis for improved oropharyngeal function: Good Barriers to Reach Goals: Language deficits      Swallow Study   General HPI: [name] is a 89 y.o. male with admitted for acute onset right facial droop, right-sided weakness and altered mental status.  He was found to have left MCA M1 occlusion and was taken to interventional radiology for successful mechanical thrombectomy.Intubated 6/7-6/9. history of A-fib on edoxaban  (switched to edoxaban  as Eliquis  interacted with prostate cancer treatment), CHF, left MCA stroke in 2021 status post thrombectomy, diabetes, hypertension, hyperlipidemia, sleep apnea on CPAP, CKD stage IIIb and prostate cancer on Xtandi  and leuprolide  Type of Study: Bedside Swallow Evaluation Previous Swallow Assessment: none Diet Prior to this Study: NPO Temperature Spikes Noted: No Respiratory Status: Room air History of Recent Intubation: Yes Total duration of intubation (days): 3 days Date extubated: 06/30/23 Behavior/Cognition: Alert;Cooperative;Requires cueing Oral Cavity Assessment: Within Functional Limits Oral Care Completed by SLP: No Oral Cavity - Dentition: Missing dentition Vision: Functional for self-feeding Self-Feeding Abilities: Needs assist Patient Positioning: Upright in bed Baseline Vocal Quality: Normal Volitional Cough: Cognitively unable to elicit Volitional Swallow: Unable to elicit    Oral/Motor/Sensory Function Overall Oral Motor/Sensory Function: Mild impairment Facial ROM: Reduced right;Suspected CN VII (facial)  dysfunction Facial Symmetry: Abnormal symmetry right;Suspected CN VII (facial) dysfunction   Ice Chips Ice chips: Within functional limits   Thin Liquid Thin Liquid: Impaired Presentation: Straw Pharyngeal  Phase Impairments: Cough - Immediate    Nectar Thick Nectar Thick Liquid: Not tested   Honey Thick Honey Thick Liquid: Not tested   Puree Puree: Within functional limits Presentation: Spoon   Solid           Noble Bateman, MA CCC-SLP  Acute Rehabilitation Services Secure Chat Preferred Office [phone]  Valeri Gate 07/01/2023,11:34 AM

## 2023-07-01 NOTE — TOC Initial Note (Signed)
 Transition of Care [facility]) - Initial/Assessment Note    Patient Details  Name: [name] MRN: [id number] Date of Birth: 07-04-1933  Transition of Care [facility]) CM/SW Contact:    Jannice Mends, LCSW Phone Number: 07/01/2023, 4:27 PM  Clinical Narrative:                 Patient admitted from home with spouse and was recently discharged with [facility] home health. TOC following for CIR determination of candidacy.   Expected Discharge Plan: IP Rehab Facility Barriers to Discharge: Continued Medical Work up, English as a second language teacher   Patient Goals and CMS Choice            Expected Discharge Plan and Services       Living arrangements for the past 2 months: Single Family Home                                      Prior Living Arrangements/Services Living arrangements for the past 2 months: Single Family Home Lives with:: Spouse Patient language and need for interpreter reviewed:: Yes Do you feel safe going back to the place where you live?: Yes      Need for Family Participation in Patient Care: Yes (Comment) Care giver support system in place?: Yes (comment)   Criminal Activity/Legal Involvement Pertinent to Current Situation/Hospitalization: No - Comment as needed  Activities of Daily Living      Permission Sought/Granted Permission sought to share information with : Facility Medical sales representative, Family Supports    Share Information with NAME: Tiffany     Permission granted to share info w Relationship: Daughter  Permission granted to share info w Contact Information: [phone]  Emotional Assessment Appearance:: Appears stated age Attitude/Demeanor/Rapport: Unable to Assess Affect (typically observed): Unable to Assess Orientation: : Oriented to Place, Oriented to Self Alcohol / Substance Use: Not Applicable Psych Involvement: No (comment)  Admission diagnosis:  Acute ischemic left MCA stroke (HCC) [I63.512] Middle cerebral artery embolism,  left [I66.02] Patient Active Problem List   Diagnosis Date Noted   Middle cerebral artery embolism, left 06/28/2023   Altered mental status 06/27/2023   Monomorphic ventricular tachycardia (HCC) 06/23/2023   Hypothermia 06/23/2023   Acute respiratory failure with hypoxia (HCC) 09/21/2022   (HFpEF) heart failure with preserved ejection fraction (HCC) 09/21/2022   Cellulitis of right upper extremity 09/10/2022   Metabolic acidosis 09/09/2022   Thrombocytopenia (HCC) 09/09/2022   S/P placement of leadless cardiac pacemaker 08/14/2022   Rectal bleeding 07/23/2022   Food bolus obstruction of intestine (HCC) 03/19/2022   Paroxysmal atrial fibrillation (HCC) 10/19/2021   Obesity (BMI 30-39.9) 10/05/2021   NSVT (nonsustained ventricular tachycardia) (HCC) 10/05/2021   Acute renal failure superimposed on stage 3a chronic kidney disease (HCC) 10/04/2021   Acute deep vein thrombosis (DVT) of femoral vein of left lower extremity (HCC) 01/08/2021   Critical limb ischemia of left lower extremity (HCC) 12/25/2020   CKD (chronic kidney disease) stage 4, GFR 15-29 ml/min (HCC) 12/25/2020   Type II diabetes mellitus with renal manifestations (HCC) 12/25/2020   Stroke (HCC) 12/25/2020   Sick sinus syndrome (HCC) 12/19/2020   Retroperitoneal lymphadenopathy 04/26/2020   Other specified disorders of kidney and ureter 01/02/2020   Abdominal distension 10/31/2019   Atherosclerosis of aorta (HCC) 10/15/2019   Androgen deprivation therapy 10/13/2019   Other constipation 08/29/2019   Acute ischemic left MCA stroke (HCC) 08/27/2019   Normocytic anemia 08/05/2019  Prostate cancer (HCC) 08/05/2019   Goals of care, counseling/discussion 07/22/2019   Fecal impaction (HCC) 07/11/2019   Lower GI bleeding 06/30/2019   Diverticulosis of colon 02/07/2019   COPD (chronic obstructive pulmonary disease) (HCC)    Chronic a-fib (HCC)    Type 2 diabetes mellitus with hyperglycemia (HCC) 10/23/2018   NSTEMI (non-ST  elevated myocardial infarction) (HCC) 10/23/2018   Need for vaccination against Streptococcus pneumoniae using pneumococcal conjugate vaccine 13 10/23/2018   Chronic mesenteric ischemia (HCC) 07/28/2018   Abdominal aortic aneurysm without rupture (HCC) 07/28/2018   Primary osteoarthritis of left hip 02/12/2018   Bilateral hearing loss due to cerumen impaction 10/06/2017   Chronic systolic heart failure (HCC) 08/19/2017   Atrial fibrillation with RVR (HCC) 08/02/2017   Acute on chronic heart failure with preserved ejection fraction (HFpEF) (HCC) 08/02/2017   Dysuria 07/20/2017   Hyperpigmentation 04/10/2017   Uncontrolled type 2 diabetes mellitus with hyperglycemia (HCC) 03/24/2017   Encounter for general adult medical examination with abnormal findings 03/24/2017   Chest pain 05/04/2016   Elevated troponin 05/04/2016   Essential hypertension 07/06/2014   Cough 03/21/2014   OSA (obstructive sleep apnea) 03/21/2014   Hx of adenomatous colonic polyps 06/28/2013   Liver cyst 06/28/2013   CKD (chronic kidney disease), stage IV (HCC) 06/08/2013   Diabetes mellitus (HCC) 06/08/2013   Hyperlipidemia, mixed 06/08/2013   Valvular heart disease 06/08/2013   PCP:  Laurence Pons, NP Pharmacy:   [id number] - [place], [place] - [address] AT [facility] [address] [place] [address] [place] [address] Phone: [phone] Fax: [phone]  [facility] [address] [place] [address] Phone: [phone] Fax: [phone]  [facility] [address] [place] [address] Phone: [phone] Fax: [phone]     Social Drivers of Health (SDOH) Social History: SDOH Screenings   Food Insecurity: No Food Insecurity (06/23/2023)  Housing: Low Risk  (06/23/2023)  Transportation Needs: No Transportation Needs (06/23/2023)  Utilities: Not At Risk (06/23/2023)  Alcohol Screen: Low Risk   (10/26/2021)  Depression (PHQ2-9): Low Risk  (12/26/2022)  Financial Resource Strain: Low Risk  (10/29/2019)  Recent Concern: Financial Resource Strain - Medium Risk (09/08/2019)  Physical Activity: Inactive (10/29/2019)  Social Connections: Moderately Integrated (06/23/2023)  Stress: No Stress Concern Present (10/29/2019)  Recent Concern: Stress - Stress Concern Present (09/08/2019)  Tobacco Use: Medium Risk (06/28/2023)   SDOH Interventions:     Readmission Risk Interventions    07/01/2023    4:26 PM 09/11/2022    1:23 PM 12/26/2020   12:08 PM  Readmission Risk Prevention Plan  Transportation Screening Complete Complete Complete  PCP or Specialist Appt within 3-5 Days   Complete  HRI or Home Care Consult   Complete  Social Work Consult for Recovery Care Planning/Counseling   Complete  Palliative Care Screening   Not Applicable  Medication Review Oceanographer) Complete Referral to Pharmacy   PCP or Specialist appointment within 3-5 days of discharge Complete Complete   HRI or Home Care Consult Complete Complete   SW Recovery Care/Counseling Consult Complete Complete   Palliative Care Screening Not Applicable Not Applicable   Skilled Nursing Facility Not Applicable Not Applicable

## 2023-07-01 NOTE — Evaluation (Signed)
 Occupational Therapy Evaluation Patient Details Name: [name] MRN: [id number] DOB: Sep 29, 1933 Today's Date: 07/01/2023   History of Present Illness   89 y/o male presented to [facility] ED on 06/28/23 with facial droop and R sided weakness. NIH 25 and imaging showed left MCA infarct with M1 occlusion. Transferred to [facility] and s/p INR L CFA approach. EEG suggestive of severe diffuse encephalopathy likely related to dedation.  Intubated 6/7 -6/9. Recent admission at [facility] with  monomorphic V. Tach s/p cardioversion. PMH: permanent Afib, sick sinus syndrome s/p PPM, HFpEF, CKD stage IIIb, HTN, T2DM, stage IVb metastatic prostate cancer, DVT     Clinical Impressions PTA, pt lived with wife and was independent and works at an [place]. Upon eval, pt presents with impaired expressive and receptive communication, R sided weakness and decr attention, decreased vision, safety, awareness, and balance. Pt with decr praxis with incorrect use of toothbrush vs poor attention to task but attempts to hold toothbrush in mouth rather than purposive use. Pt needing mod-max A +2 for bed mobility and max A +2 for STS transfers with poor hip extension to come fully upright. Will continue to follow acutely, and recommending intensive multidisciplinary inpatient rehabilitation to optimize return to ADL and IADL.      If plan is discharge home, recommend the following:   Two people to help with walking and/or transfers;Two people to help with bathing/dressing/bathroom;Assistance with cooking/housework;Assistance with feeding;Direct supervision/assist for medications management;Direct supervision/assist for financial management;Assist for transportation;Help with stairs or ramp for entrance;Supervision due to cognitive status     Functional Status Assessment   Patient has had a recent decline in their functional status and demonstrates the ability to make significant improvements in function in a reasonable and  predictable amount of time.     Equipment Recommendations   Other (comment) (defer)     Recommendations for Other Services   Rehab consult;Speech consult     Precautions/Restrictions   Precautions Precautions: Fall Restrictions Weight Bearing Restrictions Per Provider Order: No     Mobility Bed Mobility Overal bed mobility: Needs Assistance Bed Mobility: Supine to Sit, Sit to Supine     Supine to sit: Mod assist, +2 for physical assistance Sit to supine: Max assist, +2 for physical assistance   General bed mobility comments: Pt initiating well, reaching for rail with LUE, assist for R side management provided. Increased assist to return to supine due to pt comprehension    Transfers Overall transfer level: Needs assistance Equipment used: 2 person hand held assist Transfers: Sit to/from Stand Sit to Stand: Max assist, +2 physical assistance           General transfer comment: +2 maxA to come to partial stand from edge of bed x 2 with right knee block. Tactile facilitation for glute activation and upright posture. Pt able to sustain for peri care      Balance Overall balance assessment: Needs assistance Sitting-balance support: Feet supported Sitting balance-Leahy Scale: Fair     Standing balance support: Bilateral upper extremity supported, Reliant on assistive device for balance Standing balance-Leahy Scale: Poor                             ADL either performed or assessed with clinical judgement   ADL Overall ADL's : Needs assistance/impaired Eating/Feeding: NPO   Grooming: Total assistance;Sitting Grooming Details (indicate cue type and reason): pt takes toothbrush and puts in mouth and lets go as if lollipop.  pt needing total A hand over hand to brush teeth with toothbrush Upper Body Bathing: Maximal assistance;Sitting   Lower Body Bathing: Total assistance;+2 for safety/equipment;+2 for physical assistance   Upper Body Dressing :  Maximal assistance;Sitting   Lower Body Dressing: Total assistance;+2 for physical assistance;+2 for safety/equipment   Toilet Transfer: +2 for physical assistance;+2 for safety/equipment;Maximal assistance Toilet Transfer Details (indicate cue type and reason): for STS                 Vision Patient Visual Report: Other (comment) (unable to report) Vision Assessment?: Vision impaired- to be further tested in functional context Additional Comments: decreased visual attention to R side     Perception Perception: Impaired Preception Impairment Details: Inattention/Neglect Perception-Other Comments: to R   Praxis Praxis: Impaired Praxis Impairment Details: Ideation, Ideomotor, Limb apraxia     Pertinent Vitals/Pain Pain Assessment Pain Assessment: Faces Faces Pain Scale: No hurt     Extremity/Trunk Assessment Upper Extremity Assessment Upper Extremity Assessment: Generalized weakness;RUE deficits/detail RUE Deficits / Details: RUE with some AROM and does appear to feel light touch when not distracted by additional stimuli. Attempts to use BUE to reposition self at EOB but no lifting against gravity noted and significant inattention. PROM WFL RUE Sensation: decreased light touch;decreased proprioception RUE Coordination: decreased fine motor;decreased gross motor   Lower Extremity Assessment Lower Extremity Assessment: Defer to PT evaluation RLE Deficits / Details: Flicker of hamstring activation LLE Deficits / Details: Moves spontaneously   Cervical / Trunk Assessment Cervical / Trunk Assessment: Kyphotic   Communication Communication Communication: Impaired Factors Affecting Communication: Hearing impaired;Difficulty expressing self   Cognition Arousal: Alert Behavior During Therapy: WFL for tasks assessed/performed Cognition: Cognition impaired, Difficult to assess Difficult to assess due to: Impaired communication   Awareness: Intellectual awareness impaired,  Online awareness impaired Memory impairment (select all impairments): Non-declarative long-term memory Attention impairment (select first level of impairment): Focused attention Executive functioning impairment (select all impairments): Initiation, Organization, Sequencing, Problem solving, Reasoning OT - Cognition Comments: pt with limited verbalizations. Not following commands well (<5%) appearing to have receptive and expressive language deficits                 Following commands: Impaired Following commands impaired: Follows one step commands inconsistently     Cueing  General Comments   Cueing Techniques: Verbal cues;Tactile cues;Gestural cues  VSS, wife present and daughter in law present at end of session   Exercises     Shoulder Instructions      Home Living Family/patient expects to be discharged to:: Private residence Living Arrangements: Spouse/significant other Available Help at Discharge: Family Type of Home: House Home Access: Stairs to enter Secretary/administrator of Steps: 1-2 Entrance Stairs-Rails: None Home Layout: One level     Bathroom Shower/Tub: Chief Strategy Officer: Handicapped height     Home Equipment: Agricultural consultant (2 wheels);Cane - single point          Prior Functioning/Environment Prior Level of Function : Independent/Modified Independent;Driving;Working/employed             Mobility Comments: using cane for ambulation, still works at airport ADLs Comments: Mod I    OT Problem List: Decreased strength;Decreased activity tolerance;Impaired balance (sitting and/or standing);Decreased cognition;Decreased coordination;Impaired vision/perception;Decreased range of motion;Decreased safety awareness;Decreased knowledge of use of DME or AE;Impaired sensation;Impaired UE functional use   OT Treatment/Interventions: Self-care/ADL training;Therapeutic exercise;DME and/or AE instruction;Balance training;Patient/family  education;Therapeutic activities;Neuromuscular education;Cognitive remediation/compensation;Visual/perceptual remediation/compensation      OT Goals(Current  goals can be found in the care plan section)   Acute Rehab OT Goals Patient Stated Goal: unable OT Goal Formulation: Patient unable to participate in goal setting Time For Goal Achievement: 07/15/23 Potential to Achieve Goals: Good   OT Frequency:  Min 2X/week    Co-evaluation PT/OT/SLP Co-Evaluation/Treatment: Yes Reason for Co-Treatment: Necessary to address cognition/behavior during functional activity;To address functional/ADL transfers;For patient/therapist safety PT goals addressed during session: Mobility/safety with mobility OT goals addressed during session: ADL's and self-care      AM-PAC OT "6 Clicks" Daily Activity     Outcome Measure Help from another person eating meals?: Total Help from another person taking care of personal grooming?: Total Help from another person toileting, which includes using toliet, bedpan, or urinal?: Total Help from another person bathing (including washing, rinsing, drying)?: Total Help from another person to put on and taking off regular upper body clothing?: A Lot Help from another person to put on and taking off regular lower body clothing?: Total 6 Click Score: 7   End of Session Equipment Utilized During Treatment: Gait belt Nurse Communication: Mobility status  Activity Tolerance: Patient tolerated treatment well Patient left: in bed;with call bell/phone within reach;with bed alarm set;with family/visitor present  OT Visit Diagnosis: Unsteadiness on feet (R26.81);Muscle weakness (generalized) (M62.81);Other abnormalities of gait and mobility (R26.89);Low vision, both eyes (H54.2);Apraxia (R48.2);Feeding difficulties (R63.3);Other symptoms and signs involving cognitive function;Cognitive communication deficit (R41.841);Hemiplegia and hemiparesis Symptoms and signs involving  cognitive functions: Cerebral infarction Hemiplegia - Right/Left: Right Hemiplegia - caused by: Cerebral infarction                Time: [id number] OT Time Calculation (min): 37 min Charges:  OT General Charges $OT Visit: 1 Visit OT Evaluation $OT Eval Moderate Complexity: 1 Mod  Karilyn Ouch, OTR/[facility] Acute Rehabilitation Office: [phone]   [name] 07/01/2023, 4:29 PM

## 2023-07-01 NOTE — Evaluation (Signed)
 Physical Therapy Evaluation Patient Details Name: [name] MRN: [id number] DOB: 1933-04-22 Today's Date: 07/01/2023  History of Present Illness  89 y/o male presented to [facility] ED on 06/28/23 with facial droop and R sided weakness. NIH 25 and imaging showed left MCA infarct with M1 occlusion. Transferred to [facility] and s/p INR L CFA approach. EEG suggestive of severe diffuse encephalopathy likely related to dedation.  Intubated 6/7 -6/9. Recent admission at [facility] with  monomorphic V. Tach s/p cardioversion. PMH: permanent Afib, sick sinus syndrome s/p PPM, HFpEF, CKD stage IIIb, HTN, T2DM, stage IVb metastatic prostate cancer, DVT  Clinical Impression  PTA, pt lives with his spouse and is independent; [place] Pt presents with impaired communication (expressive + receptive), right hemiplegia, inattention, balance deficits and decreased cognition. Pt requiring two person mod-max assist for functional mobility. He initiates motor tasks such as transitioning to edge of bed or standing, however, not following any commands or miming. Pt partially stood from edge of bed x 2. Patient will benefit from intensive inpatient follow-up therapy, >3 hours/day in order to address deficits, maximize functional mobility and decrease caregiver burden.        If plan is discharge home, recommend the following: Two people to help with walking and/or transfers;Two people to help with bathing/dressing/bathroom   Can travel by private vehicle   No    Equipment Recommendations Other (comment) (defer)  Recommendations for Other Services  Rehab consult    Functional Status Assessment Patient has had a recent decline in their functional status and demonstrates the ability to make significant improvements in function in a reasonable and predictable amount of time.     Precautions / Restrictions Precautions Precautions: Fall Restrictions Weight Bearing Restrictions Per Provider Order: No       Mobility  Bed Mobility Overal bed mobility: Needs Assistance Bed Mobility: Supine to Sit, Sit to Supine     Supine to sit: Mod assist, +2 for physical assistance Sit to supine: Max assist, +2 for physical assistance   General bed mobility comments: Pt initiating well, reaching for rail with LUE, assist for R side management provided. Increased assist to return to supine due to pt comprehension    Transfers Overall transfer level: Needs assistance Equipment used: 2 person hand held assist Transfers: Sit to/from Stand Sit to Stand: Max assist, +2 physical assistance           General transfer comment: +2 maxA to come to partial stand from edge of bed x 2 with right knee block. Tactile facilitation for glute activation and upright posture. Pt able to sustain for peri care    Ambulation/Gait               General Gait Details: deferred  Stairs            Wheelchair Mobility     Tilt Bed    Modified Rankin (Stroke Patients Only) Modified Rankin (Stroke Patients Only) Pre-Morbid Rankin Score: No significant disability Modified Rankin: Severe disability     Balance Overall balance assessment: Needs assistance Sitting-balance support: Feet supported Sitting balance-Leahy Scale: Fair     Standing balance support: Bilateral upper extremity supported, Reliant on assistive device for balance Standing balance-Leahy Scale: Poor                               Pertinent Vitals/Pain Pain Assessment Pain Assessment: Faces Faces Pain Scale: No hurt  Home Living Family/patient expects to be discharged to:: Private residence Living Arrangements: Spouse/significant other Available Help at Discharge: Family Type of Home: House Home Access: Stairs to enter Entrance Stairs-Rails: None Entrance Stairs-Number of Steps: 1-2   Home Layout: One level Home Equipment: Agricultural consultant (2 wheels);Cane - single point      Prior Function Prior Level of  Function : Independent/Modified Independent;Driving;Working/employed             Mobility Comments: using cane for ambulation, [place] ADLs Comments: Mod I     Extremity/Trunk Assessment   Upper Extremity Assessment Upper Extremity Assessment: Defer to OT evaluation    Lower Extremity Assessment Lower Extremity Assessment: RLE deficits/detail;LLE deficits/detail RLE Deficits / Details: Flicker of hamstring activation LLE Deficits / Details: Moves spontaneously    Cervical / Trunk Assessment Cervical / Trunk Assessment: Kyphotic  Communication   Communication Communication: Impaired Factors Affecting Communication: Hearing impaired;Difficulty expressing self    Cognition Arousal: Alert Behavior During Therapy: WFL for tasks assessed/performed   PT - Cognitive impairments: Difficult to assess Difficult to assess due to: Impaired communication                     PT - Cognition Comments: Impaired expressive and receptive communication, appropriate facial expressions, will initiate motor tasks i.e. attempting to stand up. Able to state "ok," and "oh God." Following commands: Impaired Following commands impaired: Follows one step commands inconsistently     Cueing Cueing Techniques: Verbal cues, Tactile cues, Gestural cues     General Comments      Exercises     Assessment/Plan    PT Assessment Patient needs continued PT services  PT Problem List Decreased strength;Decreased activity tolerance;Decreased balance;Decreased mobility;Decreased cognition;Decreased knowledge of use of DME;Decreased safety awareness;Decreased knowledge of precautions;Cardiopulmonary status limiting activity       PT Treatment Interventions DME instruction;Gait training;Functional mobility training;Therapeutic activities;Therapeutic exercise;Balance training;Neuromuscular re-education;Cognitive remediation;Patient/family education    PT Goals (Current goals can be  found in the Care Plan section)  Acute Rehab PT Goals Patient Stated Goal: unable PT Goal Formulation: With patient/family Time For Goal Achievement: 07/15/23 Potential to Achieve Goals: Fair    Frequency Min 3X/week     Co-evaluation PT/OT/SLP Co-Evaluation/Treatment: Yes Reason for Co-Treatment: Necessary to address cognition/behavior during functional activity;To address functional/ADL transfers;For patient/therapist safety PT goals addressed during session: Mobility/safety with mobility         AM-PAC PT "6 Clicks" Mobility  Outcome Measure Help needed turning from your back to your side while in a flat bed without using bedrails?: A Lot Help needed moving from lying on your back to sitting on the side of a flat bed without using bedrails?: A Lot Help needed moving to and from a bed to a chair (including a wheelchair)?: Total Help needed standing up from a chair using your arms (e.g., wheelchair or bedside chair)?: Total Help needed to walk in hospital room?: Total Help needed climbing 3-5 steps with a railing? : Total 6 Click Score: 8    End of Session Equipment Utilized During Treatment: Gait belt Activity Tolerance: Patient tolerated treatment well Patient left: in bed;with call bell/phone within reach;with bed alarm set;with family/visitor present Nurse Communication: Mobility status PT Visit Diagnosis: Unsteadiness on feet (R26.81);Muscle weakness (generalized) (M62.81);Difficulty in walking, not elsewhere classified (R26.2);Hemiplegia and hemiparesis Hemiplegia - Right/Left: Right Hemiplegia - dominant/non-dominant: Dominant Hemiplegia - caused by: Cerebral infarction    Time: 1330-1400 PT Time Calculation (min) (ACUTE ONLY): 30  min   Charges:   PT Evaluation $PT Eval Moderate Complexity: 1 Mod   PT General Charges $$ ACUTE PT VISIT: 1 Visit         Verdia Glad, PT, DPT Acute Rehabilitation Services Office [phone]   Claria Crofts 07/01/2023, 3:55 PM

## 2023-07-01 NOTE — Evaluation (Signed)
 Speech Language Pathology Evaluation Patient Details Name: [name] MRN: [id number] DOB: [birth year] Today's Date: 07/01/2023 Time: 0923-1000 SLP Time Calculation (min) (ACUTE ONLY): 37 min  Problem List:  Patient Active Problem List   Diagnosis Date Noted   Middle cerebral artery embolism, left 06/28/2023   Altered mental status 06/27/2023   Monomorphic ventricular tachycardia (HCC) 06/23/2023   Hypothermia 06/23/2023   Acute respiratory failure with hypoxia (HCC) 09/21/2022   (HFpEF) heart failure with preserved ejection fraction (HCC) 09/21/2022   Cellulitis of right upper extremity 09/10/2022   Metabolic acidosis 09/09/2022   Thrombocytopenia (HCC) 09/09/2022   S/P placement of leadless cardiac pacemaker 08/14/2022   Rectal bleeding 07/23/2022   Food bolus obstruction of intestine (HCC) 03/19/2022   Paroxysmal atrial fibrillation (HCC) 10/19/2021   Obesity (BMI 30-39.9) 10/05/2021   NSVT (nonsustained ventricular tachycardia) (HCC) 10/05/2021   Acute renal failure superimposed on stage 3a chronic kidney disease (HCC) 10/04/2021   Acute deep vein thrombosis (DVT) of femoral vein of left lower extremity (HCC) 01/08/2021   Critical limb ischemia of left lower extremity (HCC) 12/25/2020   CKD (chronic kidney disease) stage 4, GFR 15-29 ml/min (HCC) 12/25/2020   Type II diabetes mellitus with renal manifestations (HCC) 12/25/2020   Stroke (HCC) 12/25/2020   Sick sinus syndrome (HCC) 12/19/2020   Retroperitoneal lymphadenopathy 04/26/2020   Other specified disorders of kidney and ureter 01/02/2020   Abdominal distension 10/31/2019   Atherosclerosis of aorta (HCC) 10/15/2019   Androgen deprivation therapy 10/13/2019   Other constipation 08/29/2019   Acute ischemic left MCA stroke (HCC) 08/27/2019   Normocytic anemia 08/05/2019   Prostate cancer (HCC) 08/05/2019   Goals of care, counseling/discussion 07/22/2019   Fecal impaction (HCC) 07/11/2019   Lower GI bleeding  06/30/2019   Diverticulosis of colon 02/07/2019   COPD (chronic obstructive pulmonary disease) (HCC)    Chronic a-fib (HCC)    Type 2 diabetes mellitus with hyperglycemia (HCC) 10/23/2018   NSTEMI (non-ST elevated myocardial infarction) (HCC) 10/23/2018   Need for vaccination against Streptococcus pneumoniae using pneumococcal conjugate vaccine 13 10/23/2018   Chronic mesenteric ischemia (HCC) 07/28/2018   Abdominal aortic aneurysm without rupture (HCC) 07/28/2018   Primary osteoarthritis of left hip 02/12/2018   Bilateral hearing loss due to cerumen impaction 10/06/2017   Chronic systolic heart failure (HCC) 08/19/2017   Atrial fibrillation with RVR (HCC) 08/02/2017   Acute on chronic heart failure with preserved ejection fraction (HFpEF) (HCC) 08/02/2017   Dysuria 07/20/2017   Hyperpigmentation 04/10/2017   Uncontrolled type 2 diabetes mellitus with hyperglycemia (HCC) 03/24/2017   Encounter for general adult medical examination with abnormal findings 03/24/2017   Chest pain 05/04/2016   Elevated troponin 05/04/2016   Essential hypertension 07/06/2014   Cough 03/21/2014   OSA (obstructive sleep apnea) 03/21/2014   Hx of adenomatous colonic polyps 06/28/2013   Liver cyst 06/28/2013   CKD (chronic kidney disease), stage IV (HCC) 06/08/2013   Diabetes mellitus (HCC) 06/08/2013   Hyperlipidemia, mixed 06/08/2013   Valvular heart disease 06/08/2013   Past Medical History:  Past Medical History:  Diagnosis Date   Acute deep vein thrombosis (DVT) of femoral vein of left lower extremity (HCC)    Atrial fibrillation (HCC)    CHF (congestive heart failure) (HCC)    Chronic kidney disease    Diabetes (HCC)    Hearing loss    Hyperlipidemia    Liver cyst    Nephrolithiasis    OSA (obstructive sleep apnea)    Prostate cancer (HCC)  SSS (sick sinus syndrome) (HCC)    Stroke (HCC)    Valvular heart disease    Past Surgical History:  Past Surgical History:  Procedure Laterality  Date   CATARACT EXTRACTION     COLONOSCOPY WITH PROPOFOL  N/A 07/02/2019   Procedure: COLONOSCOPY WITH PROPOFOL ;  Surgeon: Selena Daily, MD;  Location: [facility] ENDOSCOPY;  Service: Gastroenterology;  Laterality: N/A;   ESOPHAGOGASTRODUODENOSCOPY (EGD) WITH PROPOFOL  N/A 03/19/2022   Procedure: ESOPHAGOGASTRODUODENOSCOPY (EGD) WITH PROPOFOL ;  Surgeon: Luke Salaam, MD;  Location: [facility] ENDOSCOPY;  Service: Gastroenterology;  Laterality: N/A;   IR CT HEAD LTD  08/27/2019   IR CT HEAD LTD  06/28/2023   IR PERCUTANEOUS ART THROMBECTOMY/INFUSION INTRACRANIAL INC DIAG ANGIO  08/27/2019       IR PERCUTANEOUS ART THROMBECTOMY/INFUSION INTRACRANIAL INC DIAG ANGIO  08/27/2019   IR PERCUTANEOUS ART THROMBECTOMY/INFUSION INTRACRANIAL INC DIAG ANGIO  06/28/2023   LEFT HEART CATH AND CORONARY ANGIOGRAPHY N/A 06/24/2023   Procedure: LEFT HEART CATH AND CORONARY ANGIOGRAPHY;  Surgeon: Percival Brace, MD;  Location: [facility] INVASIVE CV LAB;  Service: Cardiovascular;  Laterality: N/A;   LOWER EXTREMITY ANGIOGRAPHY Left 12/25/2020   Procedure: Lower Extremity Angiography;  Surgeon: Celso College, MD;  Location: [facility] INVASIVE CV LAB;  Service: Cardiovascular;  Laterality: Left;   PACEMAKER LEADLESS INSERTION N/A 12/19/2020   Procedure: PACEMAKER LEADLESS INSERTION;  Surgeon: Percival Brace, MD;  Location: [facility] INVASIVE CV LAB;  Service: Cardiovascular;  Laterality: N/A;   PROSTATE CANCER     PROSTATE SURGERY     RADIOLOGY WITH ANESTHESIA N/A 08/27/2019   Procedure: IR WITH ANESTHESIA;  Surgeon: Luellen Sages, MD;  Location: [facility] OR;  Service: Radiology;  Laterality: N/A;   RADIOLOGY WITH ANESTHESIA N/A 06/28/2023   Procedure: RADIOLOGY WITH ANESTHESIA;  Surgeon: Radiologist, Medication, MD;  Location: [facility] OR;  Service: Radiology;  Laterality: N/A;   HPI:  [name] is a 89 y.o. male with admitted for acute onset right facial droop, right-sided weakness and altered mental status.  He was found to have left  MCA M1 occlusion and was taken to interventional radiology for successful mechanical thrombectomy.Intubated 6/7-6/9. history of A-fib on edoxaban  (switched to edoxaban  as Eliquis  interacted with prostate cancer treatment), CHF, left MCA stroke in 2021 status post thrombectomy, diabetes, hypertension, hyperlipidemia, sleep apnea on CPAP, CKD stage IIIb and prostate cancer on Xtandi  and leuprolide    Assessment / Plan / Recommendation Clinical Impression  Pt demosntrates severe expressive and receptive language deficits. Pt is briefly attentive to tasks, but sometimes shuts eyes due to lethargy and needs cues to stay awake. He is able to follow some gestures and interpret commands from context, but does not follow any purely verbal commands. He does nod yes to his own name and shook his head no to a wrong name. He does verbalize, but his efforts barely approximate words, it is inconsistent inarticulate jargon with some prosodic elements. No improvement with [employer] models or social language. Will continue efforts to facilitate communication.    SLP Assessment  SLP Recommendation/Assessment: Patient needs continued Speech Language Pathology Services SLP Visit Diagnosis: Aphasia (R47.01)     Assistance Recommended at Discharge  Frequent or constant Supervision/Assistance  Functional Status Assessment Patient has had a recent decline in their functional status and demonstrates the ability to make significant improvements in function in a reasonable and predictable amount of time.  Frequency and Duration min 2x/week  2 weeks      SLP Evaluation Cognition  Overall Cognitive Status: Impaired/Different  from baseline Arousal/Alertness: Awake/alert Orientation Level: Oriented to person Attention: Focused;Sustained Focused Attention: Appears intact Sustained Attention: Impaired Sustained Attention Impairment: Verbal complex;Functional complex Awareness: Impaired Awareness Impairment: Intellectual  impairment;Emergent impairment;Anticipatory impairment Problem Solving: Impaired Problem Solving Impairment: Verbal basic       Comprehension  Auditory Comprehension Overall Auditory Comprehension: Impaired Yes/No Questions: Impaired Basic Biographical Questions: 0-25% accurate Commands: Impaired One Step Basic Commands: 0-24% accurate EffectiveTechniques: Visual/Gestural cues Reading Comprehension Reading Status: Impaired Functional Environmental (signs, name badge): Impaired    Expression Verbal Expression Overall Verbal Expression: Impaired Initiation: No impairment Level of Generative/Spontaneous Verbalization: Word;Phrase Repetition: Impaired Level of Impairment: Word level Naming: Impairment Responsive: 0-25% accurate Confrontation: Impaired Verbal Errors: Not aware of errors;Jargon Pragmatics: No impairment Interfering Components: Speech intelligibility Written Expression Dominant Hand: Right   Oral / Motor  Oral Motor/Sensory Function Overall Oral Motor/Sensory Function: Mild impairment Facial ROM: Reduced right;Suspected CN VII (facial) dysfunction Facial Symmetry: Abnormal symmetry right;Suspected CN VII (facial) dysfunction Motor Speech Overall Motor Speech: Impaired Respiration: Within functional limits Phonation: Normal Articulation: Impaired Level of Impairment: Word Intelligibility: Intelligibility reduced Word: 0-24% accurate            Catera Hankins, Hardin Leys 07/01/2023, 11:28 AM

## 2023-07-01 NOTE — Progress Notes (Addendum)
 STROKE TEAM PROGRESS NOTE    SIGNIFICANT HOSPITAL EVENTS 6/7-patient admitted, taken to interventional radiology with successful revascularization of left MCA M1 segment  INTERIM HISTORY/SUBJECTIVE No family at the bedside.  He was successfully extubated yesterday and is doing well. He failed his bedside swallow with speech therapy  MRI brain with evolving acute to early subacute ischemic left MCA infarct  Neurological exam he is awake and alert he is globally aphasic has a right facial droop, inconsistent blink on the right right arm is flaccid right leg with a drift We will likely transfer patient out of the ICU today  CBC    Component Value Date/Time   WBC 5.0 06/29/2023 0458   RBC 4.01 (L) 06/29/2023 0458   HGB 12.3 (L) 06/29/2023 0458   HGB 15.2 04/29/2023 1259   HGB 14.6 06/11/2019 0953   HCT 37.7 (L) 06/29/2023 0458   HCT 46.0 06/11/2019 0953   PLT 165 06/29/2023 0458   PLT 189 04/29/2023 1259   PLT 154 06/11/2019 0953   MCV 94.0 06/29/2023 0458   MCV 94 06/11/2019 0953   MCV 92 10/13/2013 0415   MCH 30.7 06/29/2023 0458   MCHC 32.6 06/29/2023 0458   RDW 14.7 06/29/2023 0458   RDW 13.3 06/11/2019 0953   RDW 15.3 (H) 10/13/2013 0415   LYMPHSABS 0.3 (L) 06/29/2023 0458   LYMPHSABS 2.4 10/13/2013 0415   MONOABS 1.0 06/29/2023 0458   MONOABS 0.8 10/13/2013 0415   EOSABS 0.1 06/29/2023 0458   EOSABS 0.2 10/13/2013 0415   BASOSABS 0.0 06/29/2023 0458   BASOSABS 0.0 10/13/2013 0415    BMET    Component Value Date/Time   NA 137 07/01/2023 0543   NA 144 10/11/2021 0936   NA 138 10/13/2013 0415   K 3.8 07/01/2023 0543   K 4.0 10/13/2013 0415   CL 105 07/01/2023 0543   CL 106 10/13/2013 0415   CO2 22 07/01/2023 0543   CO2 27 10/13/2013 0415   GLUCOSE 131 (H) 07/01/2023 0543   GLUCOSE 97 10/13/2013 0415   BUN 25 (H) 07/01/2023 0543   BUN 46 (H) 10/11/2021 0936   BUN 22 (H) 10/13/2013 0415   CREATININE 1.47 (H) 07/01/2023 0543   CREATININE 2.17 (H) 04/29/2023  1259   CREATININE 1.65 (H) 10/13/2013 0415   CALCIUM  8.4 (L) 07/01/2023 0543   CALCIUM  8.2 (L) 10/13/2013 0415   EGFR 22 (L) 10/11/2021 0936   GFRNONAA 46 (L) 07/01/2023 0543   GFRNONAA 29 (L) 04/29/2023 1259   GFRNONAA 39 (L) 10/13/2013 0415    IMAGING past 24 hours MR BRAIN WO CONTRAST Result Date: 06/30/2023 CLINICAL DATA:  Follow-up examination for stroke. EXAM: MRI HEAD WITHOUT CONTRAST TECHNIQUE: Multiplanar, multiecho pulse sequences of the brain and surrounding structures were obtained without intravenous contrast. COMPARISON:  Prior study from 06/29/2023 and earlier. FINDINGS: Brain: Examination severely degraded by motion artifact. Cerebral volume within normal limits. Patchy and confluent restricted diffusion involving the left cerebral hemisphere, consistent with an evolving acute to early subacute left MCA distribution infarct. Involvement of the left basal ganglia, left frontal, left temporal, and left parietal lobes. No significant regional mass effect. No visible significant associated blood products on this motion degraded exam. No other evidence for acute or subacute infarct. Gray-white matter differentiation otherwise grossly maintained. No other visible acute or significant chronic intracranial blood products. No mass lesion, midline shift or mass effect. No hydrocephalus or extra-axial fluid collection. Vascular: Major intracranial vascular flow voids are grossly maintained at the skull  base. Skull and upper cervical spine: Grossly unremarkable. Sinuses/Orbits: Prior bilateral ocular lens replacement. Paranasal sinuses are grossly clear. No significant mastoid effusion. Other: None. IMPRESSION: 1. Severely motion degraded exam. 2. Evolving acute to early subacute ischemic nonhemorrhagic left MCA distribution infarct. No significant regional mass effect. Electronically Signed   By: [name] M.D.   On: 06/30/2023 20:37    Vitals:   07/01/23 0700 07/01/23 0800 07/01/23  0900 07/01/23 1000  BP: 133/75 116/75 127/79 124/79  Pulse: 72 81 86 85  Resp: 17 16 (!) 25 (!) 21  Temp: 98.1 F (36.7 C)     TempSrc: Axillary     SpO2: 100% 99% 99% 100%  Weight:      Height:         PHYSICAL EXAM General: Critically ill elderly patient in no acute distress Psych:  Mood and affect appropriate for situation CV: A-fib on the monitor Respiratory: Respirations synchronous with ventilator  NEURO (sedation with propofol  paused): Pupils equal round and reactive to light, no oculocephalic reflex, corneal reflexes present bilaterally but weak, cough reflex present, no movement of right upper extremity, moves bilateral lower extremities and left upper extremity spontaneously and withdraws left upper extremity to noxious    ASSESSMENT/PLAN  [name] is a 89 y.o. male with history of A-fib on edoxaban  (switched to edoxaban  as Eliquis  interacted with prostate cancer treatment), CHF, left MCA stroke in 2021 status post thrombectomy, diabetes, hypertension, hyperlipidemia, sleep apnea on CPAP, CKD stage IIIb and prostate cancer on Xtandi  and leuprolide  admitted for acute onset right facial droop, right-sided weakness and altered mental status.  He was found to have left MCA M1 occlusion and was taken to interventional radiology for successful mechanical thrombectomy.  He remains intubated after the procedure.  NIH on Admission 26  Stroke:  left MCA infarct with left M1 occlusion s/p IR with TICI3, etiology likely A-fib on DOAC in the setting of recent cardioversion Code Stroke CT head stable acute cytotoxic edema in left MCA territory ASPECTS 7 CTA head & neck recurrent left MCA M1 occlusion, left VA moderate stenosis at origin, right VA occlusion Status post IR with TICI3 Follow-up CT Evolving left MCA territory infarct with mild edema and sulcal  effacement. No midline shift. Foci of hyperattenuation in the posterior left temporal lobe suggestive of developing  petechial hemorrhage. Hyperdense appearance of the distal left M1 segment and left M2  MRI Evolving acute to early subacute ischemic nonhemorrhagic left MCA infarct. 2D Echo EF 45 to 50% LDL 32 HgbA1c 8.7 VTE prophylaxis -heparin  subcu aspirin  81 mg daily and edoxaban  prior to admission, now on aspirin  325.  Plan to resume DOAC in 2 days Therapy recommendations: CIR but likely SNF given lack of home support Disposition: Pending  Hx of Stroke/TIA 08/2019 admitted for left BG/CR and caudate infarct.  CTA head and neck left M1 occlusion, right MCA occlusion.  CTP 0/98 cc.  S/p IR with TICI3.  MRI post IR showed left M1 patent.  EF 60 to 65%.  LDL 27, A1c 7.8.  Discharged on aspirin  325 and Lipitor 10.  Recommend discussed with patient cardiologist regarding DOAC.  Atrial fibrillation Was on pradaxa , then changed to eliquis  - ? Due to rectal bleeding ? AC was held during 06/2019 [facility] admission due to rectal bleeding and high-risk of recurrent bleeding.  CHADS-VASC score of 5 at that time, with 7.2% risk of stroke per year, but need to weigh risk of stroke versus risk of  recurrent GI bleeding with Eliquis . At the time of discharge, a mutual decision was made with patient, family, and medical team to not restart anticoagulation for fear of recurrence of life-threatening bleeding Stroke in 08/2019 as above, on discharge patient and family reluctant to start DOAC, therefore they were recommended to discuss with his cardiologist regarding DOAC as outpatient. Current home meds including amiodarone  200 mg daily, verapamil  180 mg daily, edoxaban  30 mg daily Had cardiac cath and cardioversion 2 days prior to admission Still in afib RVR with pacing, continue telemetry On home amiodarone  and verapamil  On Amio drip Plan to resume DOAC in 2 days On aspirin   Seizure-like activity During round, with stimulation, patient had left facial, left arm and leg low amplitude twitching EEG no seizure Likely stimulation  induced Continue monitoring  Hypertension Home meds: Verapamil  Stable BP goal less than 180/105 Long-term BP goal normotensive  Hyperlipidemia Home meds: Atorvastatin  10 mg daily LDL 32, goal < 70 On Lipitor 10 High intensity statin not indicated as LDL below goal Continue statin at discharge  Diabetes type II poorly controlled Home meds:  Farxiga  10 mg daily HgbA1c 8.7, goal < 7.0 CBGs SSI Recommend close follow-up with PCP for better DM control  Respiratory failure, resolved  Patient extubated 6/9  Dysphagia Patient has post-stroke dysphagia SLP following Now n.p.o. MBS scheduled for tomorrow  Other Stroke Risk Factors Obesity, Body mass index is 31.27 kg/m., BMI >/= 30 associated with increased stroke risk, recommend weight loss, diet and exercise as appropriate  Obstructive sleep apnea, on CPAP at home CHF  Other Active Problems Prostate cancer-plan to resume home Xtandi  when patient able to take p.o. medications History of rectal bleeding CKD 3B, creatinine 2.26--1.82-1.33-1.47  Hospital day # 3  Patient seen by NP with MD, MD to edit note as needed.   Jonette Nestle DNP, ACNPC-AG  [facility]  ATTENDING NOTE: I reviewed above note and agree with the assessment and plan. Pt was seen and examined.   No family at bedside.  Patient was extubated yesterday and tolerated well so far.  He is awake, alert, eyes open, global aphasia, not follow commands, nonverbal with intermittent mumbling but not meaningful.  Not able to name or repeat. No gaze palsy, tracking bilaterally, inconsistently blinking to be described on the right but more consistent on the left, PERRL.  Right mild facial droop. Tongue protrusion not corporative. LUE at least 4/5, right upper extremity flaccid. LLE no drift 3/5, RUE 3-/5 with drift.  Sensation, coordination not corporative and gait not tested.   Patient did not pass swallow, plan for barium study tomorrow.  Continue aspirin   325, will plan for resume Eliquis  in 2 days.  Continue statin.  May need core Trak tomorrow if does not pass swallow.  PT and OT recommend CIR  For detailed assessment and plan, please refer to above as I have made changes wherever appropriate.   Consuelo Denmark, MD PhD Stroke Neurology 07/01/2023 5:42 PM  This patient is critically ill due to left MCA stroke status post IR, A-fib RVR, respite 3 failure and at significant risk of neurological worsening, death form recurrent stroke, hemorrhagic termination, heart failure and renal failure. This patient's care requires constant monitoring of vital signs, hemodynamics, respiratory and cardiac monitoring, review of multiple databases, neurological assessment, discussion with family, other specialists and medical decision making of high complexity. I spent 35 minutes of neurocritical care time in the care of this patient.    To contact Stroke Continuity provider,  please refer to [email or web address]. After hours, contact General Neurology

## 2023-07-01 NOTE — Progress Notes (Addendum)
 NAME:  [name], MRN:  [id number], DOB:  10/22/1933, LOS: 3 ADMISSION DATE:  06/28/2023, CONSULTATION DATE:  07/01/2023  REFERRING MD:  Alvira Josephs, neuro IR, CHIEF COMPLAINT: Right-sided weakness  History of Present Illness:  89 year old man who was recently admitted 6/2-06/27/2023 with hypotension, altered mental status, monomorphic V. tach requiring DCCV.  LHC did not show obstructive CAD.  He was discharged on verapamil . LKW was 0800 on 6/7.  He developed right-sided weakness and was noted to have right facial droop and aphasia on arrival to [facility] ED, initial saturation was 85% improved to 95% on 2 L Head CT showed hyperdense sign with aspects 7 CT angio confirmed left M1 occlusion. He was not given TNK due to DOAC last dose night before admission He underwent aspiration thrombectomy of occluded left MCA proximal M1 segment, required Neo-Synephrine during the procedure and brought to PACU intubated.  PCCM consulted   Pertinent  Medical History  Afib on edoxaban ,  CHF,  left MCA CVA in 2021 s/p thrombectomy with no residual,  Diabetes -2,  HTN, HLD,  OSA on CPAP,  hard of hearing,  CKD IIIb,  prostate Ca   Significant Hospital Events: Including procedures, antibiotic start and stop dates in addition to other pertinent events   6/8 MRI could not be done due to PPM, head CT showed evolving left MCA infarct with mild edema, petechial hemorrhage in left temporal lobe 6/8 Amio drip, cardiology consult 6/9 extubated  Interim History / Subjective:   Much improved, extubated yesterday Low-grade fever Remains on Amio drip    Objective    Blood pressure 127/79, pulse 86, temperature 98.1 F (36.7 C), temperature source Axillary, resp. rate (!) 25, height 6\' 1"  (1.854 m), weight 107.5 kg, SpO2 99%.    Vent Mode: PSV;CPAP FiO2 (%):  [40 %] 40 % PEEP:  [5 cmH20] 5 cmH20 Pressure Support:  [8 cmH20] 8 cmH20   Intake/Output Summary (Last 24 hours) at 07/01/2023 0941 Last data  filed at 07/01/2023 0900 Gross per 24 hour  Intake 492.22 ml  Output 850 ml  Net -357.78 ml   Filed Weights   06/28/23 1700  Weight: 107.5 kg    Examination: General: Elderly, obese man, no distress HENT:  no icterus, no JVD Lungs: No accessory muscle use, clear breath sounds bilateral Cardiovascular: S1-S2 no murmur Abdomen: Soft, nontender, obese Extremities: 1+ edema Neuro: Calm, right arm weakness, aphasic, moves left arm and leg, decreased movement right leg   Labs show normal electrolytes  Resolved problem list   Hypotension     Assessment and Plan    Acute ischemic stroke -left MCA status post aspiration lobectomy EEG negative Neurology holding off DOAC due to infarct size Swallow evaluation today PT/OT  Acute respiratory failure with hypoxia -he was hypoxic to 85% on arrival to ED Previous spirometry 2022 has shown mild restriction, likely related to obesity On edoxaban  so-PE is not a consideration  - Extubated to nasal cannula  History of V. Tach requiring DCCV 06/2023- -continue  amiodarone  drip, can switch to oral when able to - Verapamil  on hold due to soft blood pressure - Cardiology following   SSS s/p micra PPM Permanent atrial fibrillation -resume edoxaban  when okay with neurology HFpEF -new mildly reduced EF 45 to 50%, recent LHC 6/3 no significant CAD -Resume Farxiga  and Lasix  when able - Required I's/O cath due to urinary retention  AKI on CKD3a -monitor renal function and output - Improved to baseline creatinine  Stage IV metastatic  prostate cancer -on hormonal therapy  PCCM will be available as needed  Best Practice (right click and "Reselect all SmartList Selections" daily)   Diet/type: NPO DVT prophylaxis DOAC Pressure ulcer(s): N/A GI prophylaxis: N/A Lines: N/A Foley:  N/A Code Status:  full code Last date of multidisciplinary goals of care discussion [NA]  Labs   CBC: Recent Labs  Lab 06/25/23 0355 06/26/23 0405  06/28/23 0913 06/28/23 1417 06/29/23 0458  WBC 5.5 5.6 8.1  --  5.0  NEUTROABS  --   --  5.4  --  3.6  HGB 14.3 14.4 13.1 12.2* 12.3*  HCT 43.6 44.5 40.2 36.0* 37.7*  MCV 94.4 94.9 94.6  --  94.0  PLT 152 131* 148*  --  165    Basic Metabolic Panel: Recent Labs  Lab 06/25/23 0355 06/26/23 0405 06/27/23 0620 06/28/23 0913 06/28/23 1417 06/28/23 2153 06/29/23 0458 06/30/23 0940 07/01/23 0543  NA 138 136 134* 131* 133*  --  133* 134* 137  K 3.8 4.6 4.4 4.6 4.3  --  4.2 4.0 3.8  CL 105 104 104 99  --   --  102 105 105  CO2 27 26 23  21*  --   --  21* 21* 22  GLUCOSE 131* 135* 144* 169*  --   --  129* 250* 131*  BUN 22 24* 30* 44*  --   --  32* 24* 25*  CREATININE 1.69* 1.76* 1.68* 2.26*  --   --  1.82* 1.33* 1.47*  CALCIUM  9.1 8.9 8.7* 8.7*  --   --  8.5* 7.7* 8.4*  MG 2.0 2.4  --   --   --  2.1  --  2.0 2.1  PHOS  --   --   --   --   --   --   --  3.0 2.6   GFR: Estimated Creatinine Clearance: 44.7 mL/min (A) (by C-G formula based on SCr of 1.47 mg/dL (H)). Recent Labs  Lab 06/25/23 0355 06/26/23 0405 06/28/23 0913 06/29/23 0458  WBC 5.5 5.6 8.1 5.0    Liver Function Tests: Recent Labs  Lab 06/28/23 0913 06/29/23 0458  AST 24 20  ALT 20 17  ALKPHOS 81 80  BILITOT 2.1* 1.2  PROT 6.8 6.5  ALBUMIN  2.9* 2.3*   No results for input(s): "LIPASE", "AMYLASE" in the last 168 hours. No results for input(s): "AMMONIA" in the last 168 hours.  ABG    Component Value Date/Time   PHART 7.452 (H) 06/28/2023 1417   PCO2ART 31.0 (L) 06/28/2023 1417   PO2ART 112 (H) 06/28/2023 1417   HCO3 21.8 06/28/2023 1417   TCO2 23 06/28/2023 1417   ACIDBASEDEF 2.0 06/28/2023 1417   O2SAT 99 06/28/2023 1417     Coagulation Profile: Recent Labs  Lab 06/28/23 0913 06/29/23 0458  INR 1.9* 1.4*    Cardiac Enzymes: No results for input(s): "CKTOTAL", "CKMB", "CKMBINDEX", "TROPONINI" in the last 168 hours.  HbA1C: Hemoglobin A1C  Date/Time Value Ref Range Status   03/10/2023 03:37 PM 8.7 (A) 4.0 - 5.6 % Final  10/04/2022 10:45 AM 7.5 (A) 4.0 - 5.6 % Final   Hgb A1c MFr Bld  Date/Time Value Ref Range Status  06/29/2023 04:58 AM 6.9 (H) 4.8 - 5.6 % Final    Comment:    (NOTE)         Prediabetes: 5.7 - 6.4         Diabetes: >6.4         Glycemic control  for adults with diabetes: <7.0   08/28/2019 12:58 PM 6.4 (H) 4.8 - 5.6 % Final    Comment:    (NOTE) Pre diabetes:          5.7%-6.4%  Diabetes:              >6.4%  Glycemic control for   <7.0% adults with diabetes     CBG: Recent Labs  Lab 06/30/23 1557 06/30/23 1928 06/30/23 2321 07/01/23 0338 07/01/23 0738  GLUCAP 108* 108* 144* 114* 109*    Celene Coins MD. FCCP. [facility] Pager : [phone]  If no response to pager , please call [phone] until 7 pm After 7:00 pm call Elink  [phone]   07/01/2023

## 2023-07-02 ENCOUNTER — Other Ambulatory Visit: Payer: Self-pay

## 2023-07-02 ENCOUNTER — Inpatient Hospital Stay (HOSPITAL_COMMUNITY)

## 2023-07-02 DIAGNOSIS — I63512 Cerebral infarction due to unspecified occlusion or stenosis of left middle cerebral artery: Secondary | ICD-10-CM | POA: Diagnosis not present

## 2023-07-02 DIAGNOSIS — R569 Unspecified convulsions: Secondary | ICD-10-CM | POA: Diagnosis not present

## 2023-07-02 DIAGNOSIS — I69391 Dysphagia following cerebral infarction: Secondary | ICD-10-CM | POA: Diagnosis not present

## 2023-07-02 DIAGNOSIS — I4891 Unspecified atrial fibrillation: Secondary | ICD-10-CM | POA: Diagnosis not present

## 2023-07-02 LAB — BASIC METABOLIC PANEL WITH GFR
Anion gap: 8 (ref 5–15)
BUN: 22 mg/dL (ref 8–23)
CO2: 25 mmol/L (ref 22–32)
Calcium: 8.5 mg/dL — ABNORMAL LOW (ref 8.9–10.3)
Chloride: 104 mmol/L (ref 98–111)
Creatinine, Ser: 1.51 mg/dL — ABNORMAL HIGH (ref 0.61–1.24)
GFR, Estimated: 44 mL/min — ABNORMAL LOW (ref >60)
Glucose, Bld: 149 mg/dL — ABNORMAL HIGH (ref 70–99)
Potassium: 3.8 mmol/L (ref 3.5–5.1)
Sodium: 137 mmol/L (ref 135–145)

## 2023-07-02 LAB — GLUCOSE, CAPILLARY
Glucose-Capillary: 131 mg/dL — ABNORMAL HIGH (ref 70–99)
Glucose-Capillary: 120 mg/dL — ABNORMAL HIGH (ref 70–99)
Glucose-Capillary: 115 mg/dL — ABNORMAL HIGH (ref 70–99)
Glucose-Capillary: 116 mg/dL — ABNORMAL HIGH (ref 70–99)
Glucose-Capillary: 133 mg/dL — ABNORMAL HIGH (ref 70–99)

## 2023-07-02 LAB — CBC
HCT: 40.8 % (ref 39.0–52.0)
Hemoglobin: 13 g/dL (ref 13.0–17.0)
MCH: 30 pg (ref 26.0–34.0)
MCHC: 31.9 g/dL (ref 30.0–36.0)
MCV: 94 fL (ref 80.0–100.0)
Platelets: 255 10*3/uL (ref 150–400)
RBC: 4.34 MIL/uL (ref 4.22–5.81)
RDW: 14.7 % (ref 11.5–15.5)
WBC: 7.4 10*3/uL (ref 4.0–10.5)
nRBC: 0 % (ref 0.0–0.2)

## 2023-07-02 NOTE — Progress Notes (Addendum)
 Modified Barium Swallow Study  Patient Details  Name: [name] MRN: [id number] Date of Birth: July 16, 1933  Today's Date: 07/02/2023  Modified Barium Swallow completed.  Full report located under Chart Review in the Imaging Section.  History of Present Illness [name] is a 89 y.o. male with admitted for acute onset right facial droop, right-sided weakness and altered mental status.  He was found to have left MCA M1 occlusion and was taken to interventional radiology for successful mechanical thrombectomy.Intubated 6/7-6/9. history of A-fib on edoxaban  (switched to edoxaban  as Eliquis  interacted with prostate cancer treatment), CHF, left MCA stroke in 2021 status post thrombectomy, diabetes, hypertension, hyperlipidemia, sleep apnea on CPAP, CKD stage IIIb and prostate cancer on Xtandi  and leuprolide    Clinical Impression Pt demonstrates a primary oral dysphagia with right CN VII and XII weakness. Oral impairments lead to premature spillage, contribute to delayed initiation of swallow and aspiration of oral residue that spills posteriorally post swallow. DIGEST score of 2. Pt has aspiration of thin liquids before the swallow with sensation and good ejection. Oral residue of nectar and honey thick liquids also elicit risk of aspiration post swallow and honey thick liquids resulted in the most singificant aspiration event with hard prolonged coughing from pt. Could not attempt mastication of solids due to coughing and lingual weakness, but if diet is tolerated would attempt minced and moist solids soon. Esophageal sweep also showed distal esophageal stasis with thin liquids. For this pt, though thin liquids are likely to result in coughing, coughing is protective and most easily ejects thin liquids. Recommend pt initiate thin liquids and purees. Oral hygiene, positioning and mobility are important defenses against negative consequences of aspiration. Meds to be crushed in  puree.  DIGEST Swallow Severity Rating*  Safety: 2  Efficiency:1  Overall Pharyngeal Swallow Severity: 2 1: mild; 2: moderate; 3: severe; 4: profound  *The Dynamic Imaging Grade of Swallowing Toxicity is standardized for the head and neck cancer population, however, demonstrates promising clinical applications across populations to standardize the clinical rating of pharyngeal swallow safety and severity.  Factors that may increase risk of adverse event in presence of aspiration Roderick Civatte & Jessy Morocco 2021):    Swallow Evaluation Recommendations Recommendations: PO diet PO Diet Recommendation: Dysphagia 1 (Pureed);Thin liquids (Level 0) Liquid Administration via: Straw Medication Administration: Crushed with puree Supervision: Staff to assist with self-feeding Swallowing strategies  : Slow rate;Small bites/sips Postural changes: Position pt fully upright for meals Oral care recommendations: Oral care BID (2x/day)    Noble Bateman, MA CCC-SLP  Acute Rehabilitation Services Secure Chat Preferred Office [phone]   Valeri Gate 07/02/2023,10:50 AM

## 2023-07-02 NOTE — CV Procedure (Signed)
  Device system confirmed to be MRI conditional, with implant date > 6 weeks ago, and no evidence of abandoned or epicardial leads in review of most recent CXR  Device last cleared by EP Provider: Suzann Riddle 06/30/23  Clearance is good through for 1 year as long as parameters remain stable at time of check. If pt undergoes a cardiac device procedure during that time, they should be re-cleared.   Tachy-therapies to be programmed off if applicable with device back to pre-MRI settings after completion of exam.  [employer] - Programming recommendation received through [employer] App/Tablet  Arlys Lamer, RT  07/02/2023 9:02 AM

## 2023-07-02 NOTE — Plan of Care (Signed)
  Problem: Education: Goal: Knowledge of disease or condition will improve Outcome: Progressing Goal: Knowledge of secondary prevention will improve (MUST DOCUMENT ALL) Outcome: Progressing Goal: Knowledge of patient specific risk factors will improve (DELETE if not current risk factor) Outcome: Progressing   Problem: Ischemic Stroke/TIA Tissue Perfusion: Goal: Complications of ischemic stroke/TIA will be minimized Outcome: Progressing   Problem: Coping: Goal: Will verbalize positive feelings about self Outcome: Progressing Goal: Will identify appropriate support needs Outcome: Progressing   Problem: Health Behavior/Discharge Planning: Goal: Ability to manage health-related needs will improve Outcome: Progressing Goal: Goals will be collaboratively established with patient/family Outcome: Progressing   Problem: Self-Care: Goal: Ability to participate in self-care as condition permits will improve Outcome: Progressing Goal: Verbalization of feelings and concerns over difficulty with self-care will improve Outcome: Progressing Goal: Ability to communicate needs accurately will improve Outcome: Progressing   Problem: Nutrition: Goal: Risk of aspiration will decrease Outcome: Progressing Goal: Dietary intake will improve Outcome: Progressing   Problem: Activity: Goal: Ability to tolerate increased activity will improve Outcome: Progressing   Problem: Respiratory: Goal: Ability to maintain a clear airway and adequate ventilation will improve Outcome: Progressing   Problem: Role Relationship: Goal: Method of communication will improve Outcome: Progressing   Problem: Education: Goal: Knowledge of General Education information will improve Description: Including pain rating scale, medication(s)/side effects and non-pharmacologic comfort measures Outcome: Progressing   Problem: Health Behavior/Discharge Planning: Goal: Ability to manage health-related needs will  improve Outcome: Progressing   Problem: Clinical Measurements: Goal: Ability to maintain clinical measurements within normal limits will improve Outcome: Progressing Goal: Will remain free from infection Outcome: Progressing Goal: Diagnostic test results will improve Outcome: Progressing Goal: Respiratory complications will improve Outcome: Progressing Goal: Cardiovascular complication will be avoided Outcome: Progressing   Problem: Activity: Goal: Risk for activity intolerance will decrease Outcome: Progressing   Problem: Nutrition: Goal: Adequate nutrition will be maintained Outcome: Progressing   Problem: Coping: Goal: Level of anxiety will decrease Outcome: Progressing   Problem: Elimination: Goal: Will not experience complications related to bowel motility Outcome: Progressing Goal: Will not experience complications related to urinary retention Outcome: Progressing   Problem: Pain Managment: Goal: General experience of comfort will improve and/or be controlled Outcome: Progressing   Problem: Safety: Goal: Ability to remain free from injury will improve Outcome: Progressing   Problem: Skin Integrity: Goal: Risk for impaired skin integrity will decrease Outcome: Progressing   Problem: Education: Goal: Understanding of CV disease, CV risk reduction, and recovery process will improve Outcome: Progressing Goal: Individualized Educational Video(s) Outcome: Progressing   Problem: Activity: Goal: Ability to return to baseline activity level will improve Outcome: Progressing   Problem: Cardiovascular: Goal: Ability to achieve and maintain adequate cardiovascular perfusion will improve Outcome: Progressing Goal: Vascular access site(s) Level 0-1 will be maintained Outcome: Progressing   Problem: Health Behavior/Discharge Planning: Goal: Ability to safely manage health-related needs after discharge will improve Outcome: Progressing

## 2023-07-02 NOTE — Progress Notes (Addendum)
 STROKE TEAM PROGRESS NOTE    SIGNIFICANT HOSPITAL EVENTS 6/7-patient admitted, taken to interventional radiology with successful revascularization of left MCA M1 segment 6/10: MRI brain with evolving acute to early subacute ischemic left MCA infarct  INTERIM HISTORY/SUBJECTIVE  MBS today-->passed and PO diet recommended. Will restart eliquis  tomorrow.   Neurological exam improved with global aphasia, right facial droop, improved strength of right arm, weak right leg with drift.  Pending bed availability for transfer out of ICU   CBC    Component Value Date/Time   WBC 7.4 07/02/2023 0518   RBC 4.34 07/02/2023 0518   HGB 13.0 07/02/2023 0518   HGB 15.2 04/29/2023 1259   HGB 14.6 06/11/2019 0953   HCT 40.8 07/02/2023 0518   HCT 46.0 06/11/2019 0953   PLT 255 07/02/2023 0518   PLT 189 04/29/2023 1259   PLT 154 06/11/2019 0953   MCV 94.0 07/02/2023 0518   MCV 94 06/11/2019 0953   MCV 92 10/13/2013 0415   MCH 30.0 07/02/2023 0518   MCHC 31.9 07/02/2023 0518   RDW 14.7 07/02/2023 0518   RDW 13.3 06/11/2019 0953   RDW 15.3 (H) 10/13/2013 0415   LYMPHSABS 0.3 (L) 06/29/2023 0458   LYMPHSABS 2.4 10/13/2013 0415   MONOABS 1.0 06/29/2023 0458   MONOABS 0.8 10/13/2013 0415   EOSABS 0.1 06/29/2023 0458   EOSABS 0.2 10/13/2013 0415   BASOSABS 0.0 06/29/2023 0458   BASOSABS 0.0 10/13/2013 0415    BMET    Component Value Date/Time   NA 137 07/02/2023 0518   NA 144 10/11/2021 0936   NA 138 10/13/2013 0415   K 3.8 07/02/2023 0518   K 4.0 10/13/2013 0415   CL 104 07/02/2023 0518   CL 106 10/13/2013 0415   CO2 25 07/02/2023 0518   CO2 27 10/13/2013 0415   GLUCOSE 149 (H) 07/02/2023 0518   GLUCOSE 97 10/13/2013 0415   BUN 22 07/02/2023 0518   BUN 46 (H) 10/11/2021 0936   BUN 22 (H) 10/13/2013 0415   CREATININE 1.51 (H) 07/02/2023 0518   CREATININE 2.17 (H) 04/29/2023 1259   CREATININE 1.65 (H) 10/13/2013 0415   CALCIUM  8.5 (L) 07/02/2023 0518   CALCIUM  8.2 (L) 10/13/2013  0415   EGFR 22 (L) 10/11/2021 0936   GFRNONAA 44 (L) 07/02/2023 0518   GFRNONAA 29 (L) 04/29/2023 1259   GFRNONAA 39 (L) 10/13/2013 0415    IMAGING past 24 hours No results found.   Vitals:   07/02/23 0500 07/02/23 0600 07/02/23 0700 07/02/23 0800  BP: 135/80 134/89 (!) 138/99   Pulse: 96 96 92   Resp: 20 19 20    Temp:    99.3 F (37.4 C)  TempSrc:    Axillary  SpO2: 95% 96% 94%   Weight:      Height:        PHYSICAL EXAM General:Elderly patient in no acute distress Psych:  Mood and affect appropriate for situation.  Calm and cooperative CV: Continued A-fib on the monitor Respiratory: Regular, unlabored breathing, room air  NEURO  Eyes open spontaneously, global aphasia. Cough, gag, corneal present. EOMI, tracks examiner.  PERRL Grunting and incomprehensible sounds only. Is able to hold right upper extremity off bed momentarily after passive raise. moves bilateral lower extremities and left upper extremity spontaneously    ASSESSMENT/PLAN  [name] is a 89 y.o. male with history of A-fib on edoxaban  (switched to edoxaban  as Eliquis  interacted with prostate cancer treatment), CHF, left MCA stroke in 2021 status  post thrombectomy, diabetes, hypertension, hyperlipidemia, sleep apnea on CPAP, CKD stage IIIb and prostate cancer on Xtandi  and leuprolide  admitted for acute onset right facial droop, right-sided weakness and altered mental status.  He was found to have left MCA M1 occlusion and was taken to interventional radiology for successful mechanical thrombectomy.  He remained intubated after the procedure.  Extubated 6/9.  NIH on Admission 26  Stroke:  left MCA infarct with left M1 occlusion s/p IR with TICI3, etiology likely A-fib on DOAC in the setting of recent cardioversion Code Stroke CT head stable acute cytotoxic edema in left MCA territory ASPECTS 7 CTA head & neck recurrent left MCA M1 occlusion, left VA moderate stenosis at origin, right VA  occlusion Status post IR with TICI3 Follow-up CT Evolving left MCA territory infarct with mild edema and sulcal  effacement. No midline shift. Foci of hyperattenuation in the posterior left temporal lobe suggestive of developing petechial hemorrhage. Hyperdense appearance of the distal left M1 segment and left M2  MRI Evolving acute to early subacute ischemic nonhemorrhagic left MCA infarct. 2D Echo EF 45 to 50% LDL 32 HgbA1c 8.7 VTE prophylaxis -heparin  subcu aspirin  81 mg daily and edoxaban  prior to admission, now on aspirin  325.  Plan to resume eliquis  tomorrow Therapy recommendations: CIR but likely SNF given lack of home support Disposition: Pending  Hx of Stroke/TIA 08/2019 admitted for left BG/CR and caudate infarct.  CTA head and neck left M1 occlusion, right MCA occlusion.  CTP 0/98 cc.  S/p IR with TICI3.  MRI post IR showed left M1 patent.  EF 60 to 65%.  LDL 27, A1c 7.8.  Discharged on aspirin  325 and Lipitor 10.  Recommend discussed with patient cardiologist regarding DOAC.  Atrial fibrillation Was on pradaxa , then changed to eliquis  - ? Due to rectal bleeding ? AC was held during 06/2019 [facility] admission due to rectal bleeding and high-risk of recurrent bleeding.  CHADS-VASC score of 5 at that time, with 7.2% risk of stroke per year, but need to weigh risk of stroke versus risk of recurrent GI bleeding with Eliquis . At the time of discharge, a mutual decision was made with patient, family, and medical team to not restart anticoagulation for fear of recurrence of life-threatening bleeding Stroke in 08/2019 as above, on discharge patient and family reluctant to start DOAC, therefore they were recommended to discuss with his cardiologist regarding DOAC as outpatient. Current home meds including amiodarone  200 mg daily, verapamil  180 mg daily, edoxaban  30 mg daily Had cardiac cath and cardioversion 2 days prior to admission Still in afib RVR with pacing, continue telemetry On home  amiodarone  and verapamil   Amio drip-->transitioned to PO 6/10 Plan to resume eliquis  tomorrow On aspirin   Seizure-like activity During rounds 6/10, with stimulation, patient had left facial, left arm and leg low amplitude twitching EEG no seizure Likely stimulation induced Continue monitoring, no need for AEDs at this time   Hypertension Home meds: Verapamil , on hold Stable BP goal less than 180/105 Long-term BP goal normotensive  Hyperlipidemia Home meds: Atorvastatin  10 mg daily LDL 32, goal < 70 On Lipitor 10 High intensity statin not indicated as LDL below goal Continue statin at discharge  Diabetes type II poorly controlled Home meds:  Farxiga  10 mg daily HgbA1c 8.7, goal < 7.0 CBGs SSI Recommend close follow-up with PCP for better DM control  Respiratory failure, resolved  Patient extubated 6/9 Tolerating well  Dysphagia Patient has post-stroke dysphagia SLP following Failed bedside swallow evaluation MBS passed  today on DYS1 and thin liquids  Other Stroke Risk Factors Obesity, Body mass index is 31.27 kg/m., BMI >/= 30 associated with increased stroke risk, recommend weight loss, diet and exercise as appropriate  Obstructive sleep apnea, on CPAP at home CHF  Other Active Problems Prostate cancer-plan to resume home Xtandi  when patient able to take p.o. medications History of rectal bleeding CKD 3B, creatinine 2.26--1.82-1.33-1.47-1.51  Hospital day # 4  Patient seen by NP with MD, MD to edit note as needed.  Audrene Lease, DNP, AGACNP-BC [facility] Please use AMION for contact information & EPIC for messaging.  ATTENDING NOTE: I reviewed above note and agree with the assessment and plan. Pt was seen and examined.   No family at the bedside. Pt reclining in bed, still has global aphasia, and RUE weakness but has improved to 3-/5 able to against gravity but still drift. RLE able to against gravity but weaker than left. He passed  swallow, on dys1 and thin liquid. Now on ASA, will resume eliquis  in am.   For detailed assessment and plan, please refer to above as I have made changes wherever appropriate.   Consuelo Denmark, MD PhD Stroke Neurology 07/02/2023 9:52 PM  This patient is critically ill due to left MCA stroke status post IR, A-fib RVR, respite 3 failure and at significant risk of neurological worsening, death form recurrent stroke, hemorrhagic termination, heart failure and renal failure. This patient's care requires constant monitoring of vital signs, hemodynamics, respiratory and cardiac monitoring, review of multiple databases, neurological assessment, discussion with family, other specialists and medical decision making of high complexity. I spent 35 minutes of neurocritical care time in the care of this patient.

## 2023-07-02 NOTE — Progress Notes (Signed)
 Physical Therapy Treatment Patient Details Name: [name] MRN: [id number] DOB: 1933/05/02 Today's Date: 07/02/2023   History of Present Illness 89 y/o male presented to [facility] ED on 06/28/23 with facial droop and R sided weakness. NIH 25 and imaging showed left MCA infarct with M1 occlusion. Transferred to [facility] and s/p INR L CFA approach. EEG suggestive of severe diffuse encephalopathy likely related to dedation.  Intubated 6/7 -6/9. Recent admission at [facility] with  monomorphic V. Tach s/p cardioversion. PMH: permanent Afib, sick sinus syndrome s/p PPM, HFpEF, CKD stage IIIb, HTN, T2DM, stage IVb metastatic prostate cancer, DVT    PT Comments  Pt demonstrates activation of R hip adductors today, able to initiate transition to edge of bed. Somewhat improved command following with gestural cueing; pt attempting to verbalize, but difficult to understand speech output. Pt requiring +2 maxA for stand pivot transfer from bed to chair with R knee block. Pt able to bear weight through RLE in standing, but has difficulty with achieving upright posture. Patient will benefit from intensive inpatient follow-up therapy, >3 hours/day in order to address deficits and maximize functional mobility.     If plan is discharge home, recommend the following: Two people to help with walking and/or transfers;Two people to help with bathing/dressing/bathroom   Can travel by private vehicle     No  Equipment Recommendations  Other (comment) (defer)    Recommendations for Other Services Rehab consult     Precautions / Restrictions Precautions Precautions: Fall Restrictions Weight Bearing Restrictions Per Provider Order: No     Mobility  Bed Mobility Overal bed mobility: Needs Assistance Bed Mobility: Supine to Sit     Supine to sit: Mod assist, +2 for physical assistance     General bed mobility comments: Pt initiating well, demonstrates RLE hip adduction to slide across bed towards left, requires assist  to ultimately execute and bring off bed fully. Truncal assist provided to bring upright after pt having difficulty.    Transfers Overall transfer level: Needs assistance Equipment used: 2 person hand held assist Transfers: Sit to/from Stand, Bed to chair/wheelchair/BSC Sit to Stand: Max assist, +2 physical assistance Stand pivot transfers: Max assist, +2 physical assistance         General transfer comment: +2 maxA to come to partial stand from edge of bed with R knee block, pt with increased trunk/hip flexion, unable to stand upright. Pivot to chair towards left with hand over hand guidance for L hand locating chair. Able to bear weight through RLE.    Ambulation/Gait               General Gait Details: deferred   Stairs             Wheelchair Mobility     Tilt Bed    Modified Rankin (Stroke Patients Only) Modified Rankin (Stroke Patients Only) Pre-Morbid Rankin Score: No significant disability Modified Rankin: Severe disability     Balance Overall balance assessment: Needs assistance Sitting-balance support: Feet supported Sitting balance-Leahy Scale: Fair     Standing balance support: Bilateral upper extremity supported, Reliant on assistive device for balance Standing balance-Leahy Scale: Poor                              Communication Communication Communication: Impaired Factors Affecting Communication: Hearing impaired;Difficulty expressing self  Cognition Arousal: Alert Behavior During Therapy: WFL for tasks assessed/performed   PT - Cognitive impairments: Difficult to assess Difficult  to assess due to: Impaired communication                     PT - Cognition Comments: Impaired expressive and receptive communication, attempting to verbalize. Improved command following with gestural cueing today Following commands: Impaired Following commands impaired: Follows one step commands inconsistently    Cueing Cueing  Techniques: Verbal cues, Tactile cues, Gestural cues  Exercises      General Comments        Pertinent Vitals/Pain Pain Assessment Pain Assessment: Faces Faces Pain Scale: No hurt    Home Living                          Prior Function            PT Goals (current goals can now be found in the care plan section) Acute Rehab PT Goals Patient Stated Goal: unable PT Goal Formulation: With patient/family Time For Goal Achievement: 07/15/23 Potential to Achieve Goals: Fair Progress towards PT goals: Progressing toward goals    Frequency    Min 3X/week      PT Plan      Co-evaluation              AM-PAC PT 6 Clicks Mobility   Outcome Measure  Help needed turning from your back to your side while in a flat bed without using bedrails?: A Lot Help needed moving from lying on your back to sitting on the side of a flat bed without using bedrails?: A Lot Help needed moving to and from a bed to a chair (including a wheelchair)?: Total Help needed standing up from a chair using your arms (e.g., wheelchair or bedside chair)?: Total Help needed to walk in hospital room?: Total Help needed climbing 3-5 steps with a railing? : Total 6 Click Score: 8    End of Session Equipment Utilized During Treatment: Gait belt Activity Tolerance: Patient tolerated treatment well Patient left: with call bell/phone within reach;in chair;Other (comment) (with chair alarm belt) Nurse Communication: Mobility status PT Visit Diagnosis: Unsteadiness on feet (R26.81);Muscle weakness (generalized) (M62.81);Difficulty in walking, not elsewhere classified (R26.2);Hemiplegia and hemiparesis Hemiplegia - Right/Left: Right Hemiplegia - dominant/non-dominant: Dominant Hemiplegia - caused by: Cerebral infarction     Time: 1131-1154 PT Time Calculation (min) (ACUTE ONLY): 23 min  Charges:    $Therapeutic Activity: 23-37 mins PT General Charges $$ ACUTE PT VISIT: 1 Visit                      Verdia Glad, PT, DPT Acute Rehabilitation Services Office [phone]    Claria Crofts 07/02/2023, 2:58 PM

## 2023-07-02 NOTE — Progress Notes (Signed)
 Inpatient Rehab Admissions Coordinator:   Per therapy recommendations, patient was screened for CIR candidacy by Wandalee Gust, MS, CCC-SLP. At this time, Pt. is not at at a level to tolerate the intensity of CIR at this time;  however, Pt. may have potential to progress to becoming a potential CIR candidate, so CIR admissions team will follow and monitor for progress and participation with therapies and place consult order if Pt. appears to be an appropriate candidate. Please contact me with any questions.   Wandalee Gust, MS, CCC-SLP Rehab Admissions Coordinator  [phone] (celll) [phone] (office)

## 2023-07-03 ENCOUNTER — Inpatient Hospital Stay (HOSPITAL_COMMUNITY)

## 2023-07-03 DIAGNOSIS — I69391 Dysphagia following cerebral infarction: Secondary | ICD-10-CM | POA: Diagnosis not present

## 2023-07-03 DIAGNOSIS — E1165 Type 2 diabetes mellitus with hyperglycemia: Secondary | ICD-10-CM | POA: Diagnosis not present

## 2023-07-03 DIAGNOSIS — I4891 Unspecified atrial fibrillation: Secondary | ICD-10-CM | POA: Diagnosis not present

## 2023-07-03 DIAGNOSIS — I63512 Cerebral infarction due to unspecified occlusion or stenosis of left middle cerebral artery: Secondary | ICD-10-CM | POA: Diagnosis not present

## 2023-07-03 LAB — BASIC METABOLIC PANEL WITH GFR
Anion gap: 10 (ref 5–15)
BUN: 24 mg/dL — ABNORMAL HIGH (ref 8–23)
CO2: 23 mmol/L (ref 22–32)
Calcium: 8.8 mg/dL — ABNORMAL LOW (ref 8.9–10.3)
Chloride: 108 mmol/L (ref 98–111)
Creatinine, Ser: 1.48 mg/dL — ABNORMAL HIGH (ref 0.61–1.24)
GFR, Estimated: 45 mL/min — ABNORMAL LOW (ref >60)
Glucose, Bld: 167 mg/dL — ABNORMAL HIGH (ref 70–99)
Potassium: 3.8 mmol/L (ref 3.5–5.1)
Sodium: 141 mmol/L (ref 135–145)

## 2023-07-03 LAB — GLUCOSE, CAPILLARY
Glucose-Capillary: 134 mg/dL — ABNORMAL HIGH (ref 70–99)
Glucose-Capillary: 137 mg/dL — ABNORMAL HIGH (ref 70–99)

## 2023-07-03 LAB — CBC
HCT: 39.7 % (ref 39.0–52.0)
Hemoglobin: 12.8 g/dL — ABNORMAL LOW (ref 13.0–17.0)
MCH: 30.3 pg (ref 26.0–34.0)
MCHC: 32.2 g/dL (ref 30.0–36.0)
MCV: 94.1 fL (ref 80.0–100.0)
Platelets: 307 10*3/uL (ref 150–400)
RBC: 4.22 MIL/uL (ref 4.22–5.81)
RDW: 14.8 % (ref 11.5–15.5)
WBC: 7 10*3/uL (ref 4.0–10.5)
nRBC: 0 % (ref 0.0–0.2)

## 2023-07-03 MED ORDER — IOHEXOL 9 MG/ML PO SOLN
500.0000 mL | ORAL | Status: AC
  Administered 2023-07-03 (×2): 500 mL via ORAL

## 2023-07-03 MED ORDER — IOHEXOL 350 MG/ML SOLN
75.0000 mL | Freq: Once | INTRAVENOUS | Status: AC | PRN
Start: 2023-07-03 — End: 2023-07-03
  Administered 2023-07-03: 75 mL via INTRAVENOUS

## 2023-07-03 MED ORDER — EDOXABAN TOSYLATE 30 MG PO TABS
30.0000 mg | ORAL_TABLET | ORAL | Status: DC
  Administered 2023-07-03 – 2023-07-11 (×9): 30 mg via ORAL
  Filled 2023-07-03 (×9): qty 1

## 2023-07-03 NOTE — Care Management Important Message (Signed)
 Important Message  Patient Details  Name: [name] MRN: [id number] Date of Birth: [birth year]   Important Message Given:  Yes - Medicare IM     Felix Host 07/03/2023, 3:01 PM

## 2023-07-03 NOTE — Progress Notes (Signed)
 Speech Language Pathology Treatment: Dysphagia  Patient Details Name: [name] MRN: [id number] DOB: 07-25-1933 Today's Date: 07/03/2023 Time: 1020-1040 SLP Time Calculation (min) (ACUTE ONLY): 20 min  Assessment / Plan / Recommendation Clinical Impression  Pt demonstrates stable tolerance of thin liquids. He was apparently unsupervised with breakfast and wasn't successful in feeding himself with his left hand only. Posted signs for pts safety. Pt verbalizing a bit more, but mostly only saying I dont know... and then stopping with frustration. Pt unable to follow cues to repeat his name or participate in automatic social language in a natural conversation. Pt not very attentive to conversational partners. Difficult to engage pt in therapeutic activity. Will continue efforts.   HPI HPI: [name] is a 89 y.o. male with admitted for acute onset right facial droop, right-sided weakness and altered mental status.  He was found to have left MCA M1 occlusion and was taken to interventional radiology for successful mechanical thrombectomy.Intubated 6/7-6/9. history of A-fib on edoxaban  (switched to edoxaban  as Eliquis  interacted with prostate cancer treatment), CHF, left MCA stroke in 2021 status post thrombectomy, diabetes, hypertension, hyperlipidemia, sleep apnea on CPAP, CKD stage IIIb and prostate cancer on Xtandi  and leuprolide       SLP Plan  Continue with current plan of care          Recommendations  Diet recommendations: Dysphagia 1 (puree);Thin liquid Liquids provided via: Straw Supervision: Full supervision/cueing for compensatory strategies                Rehab consult Oral care BID   Frequent or constant Supervision/Assistance Dysphagia, unspecified (R13.10)     Continue with current plan of care     Kesa Birky, Hardin Leys  07/03/2023, 11:29 AM

## 2023-07-03 NOTE — [place] FL2 (Signed)
 [place]  MEDICAID FL2 LEVEL OF CARE FORM     IDENTIFICATION  Patient Name: [name] Birthdate: 23-Jul-1933 Sex: male Admission Date (Current Location): 06/28/2023  [facility] and [place] Number:  Chiropodist and Address:  [facility] [place], [address], [place], [place] [address]      Provider Number: [id number]  Attending Physician Name and Address:  Stroke, Md, MD  Relative Name and Phone Number:       Current Level of Care: Hospital Recommended Level of Care: Skilled Nursing Facility Prior Approval Number:    Date Approved/Denied:   PASRR Number: [id number]  Discharge Plan: SNF    Current Diagnoses: Patient Active Problem List   Diagnosis Date Noted   Middle cerebral artery embolism, left 06/28/2023   Altered mental status 06/27/2023   Monomorphic ventricular tachycardia (HCC) 06/23/2023   Hypothermia 06/23/2023   Acute respiratory failure with hypoxia (HCC) 09/21/2022   (HFpEF) heart failure with preserved ejection fraction (HCC) 09/21/2022   Cellulitis of right upper extremity 09/10/2022   Metabolic acidosis 09/09/2022   Thrombocytopenia (HCC) 09/09/2022   S/P placement of leadless cardiac pacemaker 08/14/2022   Rectal bleeding 07/23/2022   Food bolus obstruction of intestine (HCC) 03/19/2022   Paroxysmal atrial fibrillation (HCC) 10/19/2021   Obesity (BMI 30-39.9) 10/05/2021   NSVT (nonsustained ventricular tachycardia) (HCC) 10/05/2021   Acute renal failure superimposed on stage 3a chronic kidney disease (HCC) 10/04/2021   Acute deep vein thrombosis (DVT) of femoral vein of left lower extremity (HCC) 01/08/2021   Critical limb ischemia of left lower extremity (HCC) 12/25/2020   CKD (chronic kidney disease) stage 4, GFR 15-29 ml/min (HCC) 12/25/2020   Type II diabetes mellitus with renal manifestations (HCC) 12/25/2020   Stroke (HCC) 12/25/2020   Sick sinus syndrome (HCC) 12/19/2020   Retroperitoneal lymphadenopathy  04/26/2020   Other specified disorders of kidney and ureter 01/02/2020   Abdominal distension 10/31/2019   Atherosclerosis of aorta (HCC) 10/15/2019   Androgen deprivation therapy 10/13/2019   Other constipation 08/29/2019   Acute ischemic left MCA stroke (HCC) 08/27/2019   Normocytic anemia 08/05/2019   Prostate cancer (HCC) 08/05/2019   Goals of care, counseling/discussion 07/22/2019   Fecal impaction (HCC) 07/11/2019   Lower GI bleeding 06/30/2019   Diverticulosis of colon 02/07/2019   COPD (chronic obstructive pulmonary disease) (HCC)    Chronic a-fib (HCC)    Type 2 diabetes mellitus with hyperglycemia (HCC) 10/23/2018   NSTEMI (non-ST elevated myocardial infarction) (HCC) 10/23/2018   Need for vaccination against Streptococcus pneumoniae using pneumococcal conjugate vaccine 13 10/23/2018   Chronic mesenteric ischemia (HCC) 07/28/2018   Abdominal aortic aneurysm without rupture (HCC) 07/28/2018   Primary osteoarthritis of left hip 02/12/2018   Bilateral hearing loss due to cerumen impaction 10/06/2017   Chronic systolic heart failure (HCC) 08/19/2017   Atrial fibrillation with RVR (HCC) 08/02/2017   Acute on chronic heart failure with preserved ejection fraction (HFpEF) (HCC) 08/02/2017   Dysuria 07/20/2017   Hyperpigmentation 04/10/2017   Uncontrolled type 2 diabetes mellitus with hyperglycemia (HCC) 03/24/2017   Encounter for general adult medical examination with abnormal findings 03/24/2017   Chest pain 05/04/2016   Elevated troponin 05/04/2016   Essential hypertension 07/06/2014   Cough 03/21/2014   OSA (obstructive sleep apnea) 03/21/2014   Hx of adenomatous colonic polyps 06/28/2013   Liver cyst 06/28/2013   CKD (chronic kidney disease), stage IV (HCC) 06/08/2013   Diabetes mellitus (HCC) 06/08/2013   Hyperlipidemia, mixed 06/08/2013  Valvular heart disease 06/08/2013    Orientation RESPIRATION BLADDER Height & Weight      (disoriented)  Normal Incontinent  Weight: 236 lb 15.9 oz (107.5 kg) Height:  6' 1 (185.4 cm)  BEHAVIORAL SYMPTOMS/MOOD NEUROLOGICAL BOWEL NUTRITION STATUS      Incontinent Diet (see DC summary)  AMBULATORY STATUS COMMUNICATION OF NEEDS Skin   Extensive Assist Non-Verbally Normal                       Personal Care Assistance Level of Assistance  Bathing, Feeding, Dressing Bathing Assistance: Maximum assistance Feeding assistance: Maximum assistance Dressing Assistance: Maximum assistance     Functional Limitations Info  Speech     Speech Info: Impaired (aphasia, nods/gestures appropriately)    SPECIAL CARE FACTORS FREQUENCY  PT (By licensed PT), OT (By licensed OT), Speech therapy     PT Frequency: 5x/wk OT Frequency: 5x/wk     Speech Therapy Frequency: 5x/wk      Contractures Contractures Info: Not present    Additional Factors Info  Code Status, Allergies Code Status Info: Full Allergies Info: NKA           Current Medications (07/03/2023):  This is the current hospital active medication list Current Facility-Administered Medications  Medication Dose Route Frequency Provider Last Rate Last Admin   acetaminophen  (TYLENOL ) tablet 650 mg  650 mg Oral Q4H PRN Lehner, Erin C, NP       Or   acetaminophen  (TYLENOL ) 160 MG/5ML solution 650 mg  650 mg Per Tube Q4H PRN Lehner, Erin C, NP   650 mg at 06/29/23 0915   Or   acetaminophen  (TYLENOL ) suppository 650 mg  650 mg Rectal Q4H PRN Lehner, Erin C, NP   650 mg at 06/30/23 2141   amiodarone  (PACERONE ) tablet 400 mg  400 mg Oral Daily Alva, Rakesh V, MD   400 mg at 07/03/23 1117   Followed by   Cecily Cohen ON 07/08/2023] amiodarone  (PACERONE ) tablet 200 mg  200 mg Oral Daily Alva, Rakesh V, MD       aspirin  suppository 300 mg  300 mg Rectal Daily Consuelo Denmark, MD   300 mg at 07/01/23 1138   Or   aspirin  tablet 325 mg  325 mg Oral Daily Consuelo Denmark, MD   325 mg at 07/03/23 1117   atorvastatin  (LIPITOR) tablet 10 mg  10 mg Oral Daily Alva, Rakesh V, MD    10 mg at 07/03/23 1117   Chlorhexidine  Gluconate Cloth 2 % PADS 6 each  6 each Topical Daily Stack, Colleen M, MD   6 each at 07/03/23 1119   docusate (COLACE) 50 MG/5ML liquid 100 mg  100 mg Oral BID PRN Alva, Rakesh V, MD       edoxaban  (SAVAYSA ) tablet 30 mg  30 mg Oral Q24H Pham, Minh Q, RPH-CPP       famotidine  (PEPCID ) tablet 20 mg  20 mg Oral Daily Alva, Rakesh V, MD   20 mg at 07/03/23 1117   heparin  injection 5,000 Units  5,000 Units Subcutaneous Q8H Consuelo Denmark, MD   5,000 Units at 07/03/23 0526   Oral care mouth rinse  15 mL Mouth Rinse 4 times per day Lind Repine, MD   15 mL at 07/03/23 1120   Oral care mouth rinse  15 mL Mouth Rinse PRN Lind Repine, MD       polyethylene glycol (MIRALAX  / GLYCOLAX ) packet 17 g  17 g Oral Daily Alva, Rakesh  V, MD   17 g at 07/03/23 1117     Discharge Medications: Please see discharge summary for a list of discharge medications.  Relevant Imaging Results:  Relevant Lab Results:   Additional Information SS#: [id number]  Tandy Fam, LCSW

## 2023-07-03 NOTE — Progress Notes (Signed)
 Physical Therapy Treatment Patient Details Name: [name] MRN: [id number] DOB: 12-11-1933 Today's Date: 07/03/2023   History of Present Illness 89 y/o male presented to [facility] ED on 06/28/23 with facial droop and R sided weakness. NIH 25 and imaging showed left MCA infarct with M1 occlusion. Transferred to [facility] and s/p INR L CFA approach. EEG suggestive of severe diffuse encephalopathy likely related to dedation.  Intubated 6/7 -6/9. Recent admission at [facility] with  monomorphic V. Tach s/p cardioversion. PMH: permanent Afib, sick sinus syndrome s/p PPM, HFpEF, CKD stage IIIb, HTN, T2DM, stage IVb metastatic prostate cancer, DVT    PT Comments  Pt is progressing well towards goals. Pt Min A for bed mobility, 2 person Mod A /Max A for sit to stand at EOB and then increased assist with time standing. Pt was unable to progress gait but was able to initiate movement with request to perform marching in place though due to poor motor control and coordination pt was unable to complete activity. Due to pt current functional status, home set up and available assistance at home recommending skilled physical therapy services > 3 hours/day in order to address strength, balance and functional mobility to decrease risk for falls, injury, immobility, skin break down and re-hospitalization.      If plan is discharge home, recommend the following: Two people to help with walking and/or transfers;Assistance with cooking/housework;Assist for transportation;Supervision due to cognitive status;Help with stairs or ramp for entrance   Can travel by private vehicle     No  Equipment Recommendations  Wheelchair cushion (measurements PT);Wheelchair (measurements PT);BSC/3in1       Precautions / Restrictions Precautions Precautions: Fall Recall of Precautions/Restrictions: Intact Restrictions Weight Bearing Restrictions Per Provider Order: No     Mobility  Bed Mobility Overal bed mobility: Needs Assistance Bed  Mobility: Supine to Sit, Sit to Supine     Supine to sit: Min assist Sit to supine: Min assist   General bed mobility comments: good initiation. Min A to get to EOB with intermittent assist at trunk and LE for supine<>sitting    Transfers Overall transfer level: Needs assistance Equipment used: 2 person hand held assist Transfers: Sit to/from Stand Sit to Stand: Mod assist, +2 physical assistance, +2 safety/equipment           General transfer comment: 2 person Mod A for initial momentum to get to standing. Pt able to get fully into standing with verbal cues for pelvic placement and standing upright (chest out). With fatigue pt has increased difficulty maintaining hip extension. Good R LE stabilization in standing. Heavy max A with wgt shifting to attempt taking steps at EOB. Pt unble to clear the floor; able to scoot feet laterally dragging across the floor in segments with multi modal cues and bil HHA at Mod A increased to Max A with fatigue. Pt was able to initiate movement with cues for marching in place but was unable to complete movement due to poor strength/coordination/motor control    Ambulation/Gait   Pre-gait activities: Heavy max A with wgt shifting to attempt taking steps at EOB. Pt unble to clear the floor; able to scoot feet laterally dragging across the floor in segments with multi modal cues and bil HHA at Mod A increased to Max A with fatigue. Pt was able to initiate movement with cues for marching in place but was unable to complete movement due to poor strength/coordination/motor control      Modified Rankin (Stroke Patients Only) Modified Rankin (Stroke  Patients Only) Pre-Morbid Rankin Score: No significant disability Modified Rankin: Severe disability     Balance Overall balance assessment: Needs assistance Sitting-balance support: Feet supported Sitting balance-Leahy Scale: Fair     Standing balance support: Bilateral upper extremity supported, Reliant  on assistive device for balance Standing balance-Leahy Scale: Poor Standing balance comment: Mod to Max A +2 to remain standing.        Communication Communication Communication: Impaired Factors Affecting Communication: Hearing impaired;Difficulty expressing self  Cognition Arousal: Alert Behavior During Therapy: WFL for tasks assessed/performed   PT - Cognitive impairments: Difficult to assess Difficult to assess due to: Impaired communication         PT - Cognition Comments: Impaired expressive and receptive communication, attempting to verbalize. Improved command following today Following commands: Impaired Following commands impaired: Follows one step commands inconsistently    Cueing Cueing Techniques: Verbal cues, Tactile cues, Gestural cues     General Comments General comments (skin integrity, edema, etc.): Spouse and daughter in law present during session. Pt HR up to 122 bpm during standing activities.      Pertinent Vitals/Pain Pain Assessment Pain Assessment: Faces Faces Pain Scale: No hurt Facial Expression: Relaxed, neutral Body Movements: Absence of movements Muscle Tension: Relaxed Compliance with ventilator (intubated pts.): N/A Vocalization (extubated pts.): Talking in normal tone or no sound CPOT Total: 0     PT Goals (current goals can now be found in the care plan section) Acute Rehab PT Goals Patient Stated Goal: unable PT Goal Formulation: With patient/family Time For Goal Achievement: 07/15/23 Potential to Achieve Goals: Fair Progress towards PT goals: Progressing toward goals    Frequency    Min 3X/week      PT Plan  Continue with current POC     Co-evaluation PT/OT/SLP Co-Evaluation/Treatment: Yes Reason for Co-Treatment: Necessary to address cognition/behavior during functional activity;To address functional/ADL transfers;For patient/therapist safety PT goals addressed during session: Mobility/safety with mobility;Balance OT  goals addressed during session: ADL's and self-care;Strengthening/ROM      AM-PAC PT 6 Clicks Mobility   Outcome Measure  Help needed turning from your back to your side while in a flat bed without using bedrails?: A Little Help needed moving from lying on your back to sitting on the side of a flat bed without using bedrails?: A Little Help needed moving to and from a bed to a chair (including a wheelchair)?: A Lot Help needed standing up from a chair using your arms (e.g., wheelchair or bedside chair)?: A Lot Help needed to walk in hospital room?: Total Help needed climbing 3-5 steps with a railing? : Total 6 Click Score: 12    End of Session Equipment Utilized During Treatment: Gait belt Activity Tolerance: Patient tolerated treatment well Patient left: with call bell/phone within reach;in bed;with bed alarm set;with family/visitor present Nurse Communication: Mobility status PT Visit Diagnosis: Unsteadiness on feet (R26.81);Muscle weakness (generalized) (M62.81);Difficulty in walking, not elsewhere classified (R26.2);Hemiplegia and hemiparesis Hemiplegia - Right/Left: Right Hemiplegia - dominant/non-dominant: Dominant Hemiplegia - caused by: Cerebral infarction     Time: 1243-1310 PT Time Calculation (min) (ACUTE ONLY): 27 min  Charges:    $Therapeutic Activity: 8-22 mins PT General Charges $$ ACUTE PT VISIT: 1 Visit                     Sloan Duncans, DPT, CLT  Acute Rehabilitation Services Office: [phone] (Secure chat preferred)    Jenice Mitts 07/03/2023, 1:26 PM

## 2023-07-03 NOTE — TOC Progression Note (Addendum)
 Transition of Care [facility]) - Progression Note    Patient Details  Name: LAMORRIS KNOBLOCK MRN: [id number] Date of Birth: 1933/12/04  [facility] CM/SW Contact  Tandy Fam, [place] Phone Number: 07/03/2023, 2:12 PM  Clinical Narrative:   CSW met with patient's spouse and daughter at bedside to discuss disposition. Family in agreement that the patient cannot tolerate the intensity of CIR, looking at SNF placement. CSW answered questions and discussed referral process, family in agreement. CSW completed referral, will follow up with daughter on bed offers.    Expected Discharge Plan: Skilled Nursing Facility Barriers to Discharge: Continued Medical Work up, English as a second language teacher  Expected Discharge Plan and Services       Living arrangements for the past 2 months: Single Family Home                                       Social Determinants of Health (SDOH) Interventions SDOH Screenings   Food Insecurity: No Food Insecurity (06/23/2023)  Housing: Low Risk  (06/23/2023)  Transportation Needs: No Transportation Needs (06/23/2023)  Utilities: Not At Risk (06/23/2023)  Alcohol Screen: Low Risk  (10/26/2021)  Depression (PHQ2-9): Low Risk  (12/26/2022)  Financial Resource Strain: Low Risk  (10/29/2019)  Recent Concern: Financial Resource Strain - Medium Risk (09/08/2019)  Physical Activity: Inactive (10/29/2019)  Social Connections: Moderately Integrated (06/23/2023)  Stress: No Stress Concern Present (10/29/2019)  Recent Concern: Stress - Stress Concern Present (09/08/2019)  Tobacco Use: Medium Risk (06/28/2023)    Readmission Risk Interventions    07/01/2023    4:26 PM 09/11/2022    1:23 PM 12/26/2020   12:08 PM  Readmission Risk Prevention Plan  Transportation Screening Complete Complete Complete  PCP or Specialist Appt within 3-5 Days   Complete  HRI or Home Care Consult   Complete  Social Work Consult for Recovery Care Planning/Counseling   Complete  Palliative  Care Screening   Not Applicable  Medication Review Oceanographer) Complete Referral to Pharmacy   PCP or Specialist appointment within 3-5 days of discharge Complete Complete   HRI or Home Care Consult Complete Complete   SW Recovery Care/Counseling Consult Complete Complete   Palliative Care Screening Not Applicable Not Applicable   Skilled Nursing Facility Not Applicable Not Applicable

## 2023-07-03 NOTE — Progress Notes (Addendum)
 STROKE TEAM PROGRESS NOTE    SIGNIFICANT HOSPITAL EVENTS 6/7-patient admitted, taken to interventional radiology with successful revascularization of left MCA M1 segment 6/10: MRI brain with evolving acute to early subacute ischemic left MCA infarct 6/11: Passed MBS. Transferred out of ICU.   INTERIM HISTORY/SUBJECTIVE  Neurological exam stable with global aphasia, right facial droop, weak right arm, weak right leg with drift. No family at bedside.   Edoxaban  has been restarted. Eliquis  not restarted due to interaction with prostate cancer medications.   Pending CT chest/abdomen/pelvis   CBC    Component Value Date/Time   WBC 7.0 07/03/2023 0552   RBC 4.22 07/03/2023 0552   HGB 12.8 (L) 07/03/2023 0552   HGB 15.2 04/29/2023 1259   HGB 14.6 06/11/2019 0953   HCT 39.7 07/03/2023 0552   HCT 46.0 06/11/2019 0953   PLT 307 07/03/2023 0552   PLT 189 04/29/2023 1259   PLT 154 06/11/2019 0953   MCV 94.1 07/03/2023 0552   MCV 94 06/11/2019 0953   MCV 92 10/13/2013 0415   MCH 30.3 07/03/2023 0552   MCHC 32.2 07/03/2023 0552   RDW 14.8 07/03/2023 0552   RDW 13.3 06/11/2019 0953   RDW 15.3 (H) 10/13/2013 0415   LYMPHSABS 0.3 (L) 06/29/2023 0458   LYMPHSABS 2.4 10/13/2013 0415   MONOABS 1.0 06/29/2023 0458   MONOABS 0.8 10/13/2013 0415   EOSABS 0.1 06/29/2023 0458   EOSABS 0.2 10/13/2013 0415   BASOSABS 0.0 06/29/2023 0458   BASOSABS 0.0 10/13/2013 0415    BMET    Component Value Date/Time   NA 141 07/03/2023 0552   NA 144 10/11/2021 0936   NA 138 10/13/2013 0415   K 3.8 07/03/2023 0552   K 4.0 10/13/2013 0415   CL 108 07/03/2023 0552   CL 106 10/13/2013 0415   CO2 23 07/03/2023 0552   CO2 27 10/13/2013 0415   GLUCOSE 167 (H) 07/03/2023 0552   GLUCOSE 97 10/13/2013 0415   BUN 24 (H) 07/03/2023 0552   BUN 46 (H) 10/11/2021 0936   BUN 22 (H) 10/13/2013 0415   CREATININE 1.48 (H) 07/03/2023 0552   CREATININE 2.17 (H) 04/29/2023 1259   CREATININE 1.65 (H) 10/13/2013  0415   CALCIUM  8.8 (L) 07/03/2023 0552   CALCIUM  8.2 (L) 10/13/2013 0415   EGFR 22 (L) 10/11/2021 0936   GFRNONAA 45 (L) 07/03/2023 0552   GFRNONAA 29 (L) 04/29/2023 1259   GFRNONAA 39 (L) 10/13/2013 0415    IMAGING past 24 hours No results found.   Vitals:   07/02/23 2103 07/02/23 2357 07/03/23 0345 07/03/23 0811  BP: 137/79 (!) 152/95 (!) 140/75 123/77  Pulse: (!) 108 95 96 99  Resp: 18 18 18 18   Temp: 97.8 F (36.6 C) 97.6 F (36.4 C) 98.4 F (36.9 C) 97.7 F (36.5 C)  TempSrc: Oral Oral Oral Oral  SpO2: 100% 93% 93% 95%  Weight:      Height:        PHYSICAL EXAM General:Elderly patient in no acute distress Psych:  Mood and affect appropriate for situation.  Calm and cooperative CV: Continued A-fib on the monitor Respiratory: Regular, unlabored breathing, room air  NEURO  Eyes open spontaneously, global aphasia. Cough, gag, corneal present. EOMI, tracks examiner.  PERRL Grunting and incomprehensible sounds only. Is able to hold right upper extremity off bed momentarily after passive raise. moves bilateral lower extremities and left upper extremity spontaneously    ASSESSMENT/PLAN  [name] is a 89 y.o. male  with history of A-fib on edoxaban  (switched to edoxaban  as Eliquis  interacted with prostate cancer treatment), CHF, left MCA stroke in 2021 status post thrombectomy, diabetes, hypertension, hyperlipidemia, sleep apnea on CPAP, CKD stage IIIb and prostate cancer on Xtandi  and leuprolide  admitted for acute onset right facial droop, right-sided weakness and altered mental status.  He was found to have left MCA M1 occlusion and was taken to interventional radiology for successful mechanical thrombectomy.  He remained intubated after the procedure.  Extubated 6/9.  NIH on Admission 26  Stroke:  left MCA infarct with left M1 occlusion s/p IR with TICI3, etiology likely A-fib on DOAC in the setting of recent cardioversion Code Stroke CT head stable acute  cytotoxic edema in left MCA territory ASPECTS 7 CTA head & neck recurrent left MCA M1 occlusion, left VA moderate stenosis at origin, right VA occlusion Status post IR with TICI3 Follow-up CT Evolving left MCA territory infarct with mild edema and sulcal  effacement. No midline shift. Foci of hyperattenuation in the posterior left temporal lobe suggestive of developing petechial hemorrhage. Hyperdense appearance of the distal left M1 segment and left M2  MRI Evolving acute to early subacute ischemic nonhemorrhagic left MCA infarct. Pan CT pending to rule out advance malignancy given prostate cancer hx  2D Echo EF 45 to 50% LDL 32 HgbA1c 8.7 VTE prophylaxis -heparin  subcu aspirin  81 mg daily and edoxaban  prior to admission, now off aspirin  325 and Edoxaban  resumed today.   Therapy recommendations: CIR but likely SNF given lack of home support Disposition: Pending  Hx of Stroke/TIA 08/2019 admitted for left BG/CR and caudate infarct.  CTA head and neck left M1 occlusion, right MCA occlusion.  CTP 0/98 cc.  S/p IR with TICI3.  MRI post IR showed left M1 patent.  EF 60 to 65%.  LDL 27, A1c 7.8.  Discharged on aspirin  325 and Lipitor 10.  Recommend discussed with patient cardiologist regarding DOAC.  Atrial fibrillation Was on pradaxa , then changed to eliquis  - ? Due to rectal bleeding ? AC was held during 06/2019 [facility] admission due to rectal bleeding and high-risk of recurrent bleeding.  CHADS-VASC score of 5 at that time, with 7.2% risk of stroke per year, but need to weigh risk of stroke versus risk of recurrent GI bleeding with Eliquis . At the time of discharge, a mutual decision was made with patient, family, and medical team to not restart anticoagulation for fear of recurrence of life-threatening bleeding Stroke in 08/2019 as above, on discharge patient and family reluctant to start DOAC, therefore they were recommended to discuss with his cardiologist regarding DOAC as outpatient. Was put back  on eliquis  but then changed to edoxaban  as Eliquis  has interaction with prostate cancer medications. Current home meds including amiodarone  200 mg daily, verapamil  180 mg daily, edoxaban  30 mg daily Had cardiac cath and cardioversion 2 days prior to admission Still in afib RVR with pacing, continue telemetry Continue home amiodarone  and verapamil   Amio drip-->transitioned to PO 6/10 Edoxaban  restarted today Not Watchman device candidate at this time, may consider if significant neuro improvement.   Contiue aspirin   Hypertension Home meds: Verapamil , on hold Stable Long-term BP goal normotensive  Hyperlipidemia Home meds: Atorvastatin  10 mg daily, continue LDL 32, goal < 70 High intensity statin not indicated as LDL below goal Continue statin at discharge  Diabetes type II poorly controlled Home meds:  Farxiga  10 mg daily HgbA1c 8.7, goal < 7.0 CBGs SSI Recommend close follow-up with PCP for better DM  control  Respiratory failure, resolved  Patient extubated 6/9 Tolerating well  Dysphagia Patient has post-stroke dysphagia SLP following Failed bedside swallow evaluation MBS passed 6/11 on DYS1 and thin liquids  Other Stroke Risk Factors Obesity, Body mass index is 31.27 kg/m., BMI >/= 30 associated with increased stroke risk, recommend weight loss, diet and exercise as appropriate  Obstructive sleep apnea, on CPAP at home CHF  Other Active Problems Prostate cancer-plan to resume home Xtandi , per pharmacy History of rectal bleeding CKD 3B, creatinine 2.26--1.82-1.33-1.47-1.51--1.48  Hospital day # 5  Patient seen by NP with MD, MD to edit note as needed.  Audrene Lease, DNP, AGACNP-BC [facility] Please use AMION for contact information & EPIC for messaging.  ATTENDING NOTE: I reviewed above note and agree with the assessment and plan. Pt was seen and examined.   No family at bedside.  Patient reclining in bed, not in distress, still has global  aphasia.  Right upper extremity flaccid, right lower extremity barely against gravity.  Restarted edoxaban  today, DC aspirin .  Will check pan CT to rule out advanced malignancy given history of prostate cancer.  Continue statin.  Pending SNF.  For detailed assessment and plan, please refer to above as I have made changes wherever appropriate.   Consuelo Denmark, MD PhD Stroke Neurology 07/03/2023 4:45 PM

## 2023-07-03 NOTE — Progress Notes (Signed)
 Occupational Therapy Treatment Patient Details Name: [name] MRN: [id number] DOB: [birth year] Today's Date: 07/03/2023   History of present illness 89 y/o male presented to [facility] ED on 06/28/23 with facial droop and R sided weakness. NIH 25 and imaging showed left MCA infarct with M1 occlusion. Transferred to [facility] and s/p INR L CFA approach. EEG suggestive of severe diffuse encephalopathy likely related to dedation.  Intubated 6/7 -6/9. Recent admission at [facility] with  monomorphic V. Tach s/p cardioversion. PMH: permanent Afib, sick sinus syndrome s/p PPM, HFpEF, CKD stage IIIb, HTN, T2DM, stage IVb metastatic prostate cancer, DVT   OT comments  Pt demonstrating progress since last therapy session, AMPAC increased to 11(+5) since Tuesday. Pt able to complete bed mobility with Min A, during session he demonstrated minimal movements of RUE but seems to get more mobility with increased efforts and attention to that side. Worked with pt on hand over hand activities using RUE, he was able to achieve a full stand with mod A +2, requiring significant assist for weight shifting to offload BLEs. OT to continue working with pt to progress him as able, would benefit from frequent work with RUE to promote neuroplasticity. Patient has good family support and demonstrates the ability to tolerate 3 hours of therapy. Pt would benefit from an intensive rehab program to help maximize functional independence.       If plan is discharge home, recommend the following:  Two people to help with walking and/or transfers;Two people to help with bathing/dressing/bathroom;Assistance with cooking/housework;Assistance with feeding;Direct supervision/assist for medications management;Direct supervision/assist for financial management;Assist for transportation;Help with stairs or ramp for entrance;Supervision due to cognitive status   Equipment Recommendations  Other (comment) (defer)    Recommendations for Other Services       Precautions / Restrictions Precautions Precautions: Fall Recall of Precautions/Restrictions: Intact Restrictions Weight Bearing Restrictions Per Provider Order: No       Mobility Bed Mobility Overal bed mobility: Needs Assistance Bed Mobility: Supine to Sit, Sit to Supine     Supine to sit: Min assist Sit to supine: Min assist   General bed mobility comments: good initiation. Min A to get to EOB with intermittent assist at trunk and LE for supine<>sitting    Transfers Overall transfer level: Needs assistance Equipment used: 2 person hand held assist Transfers: Sit to/from Stand Sit to Stand: Mod assist, +2 physical assistance, +2 safety/equipment Stand pivot transfers: Max assist, +2 physical assistance         General transfer comment: 2 person Mod A for initial momentum to get to standing. Pt able to get fully into standing with verbal cues for pelvic placement and standing upright (chest out). With fatigue pt has increased difficulty maintaining hip extension. Good R LE stabilization in standing. Heavy max A with wgt shifting to attempt taking steps at EOB. Pt unble to clear the floor; able to scoot feet laterally dragging across the floor in segments with multi modal cues and bil HHA at Mod A increased to Max A with fatigue. Pt was able to initiate movement with cues for marching in place but was unable to complete movement due to poor strength/coordination/motor control     Balance Overall balance assessment: Needs assistance Sitting-balance support: Feet supported Sitting balance-Leahy Scale: Fair Sitting balance - Comments: CGA EOB   Standing balance support: Bilateral upper extremity supported, Reliant on assistive device for balance Standing balance-Leahy Scale: Poor Standing balance comment: Mod to Max A +2 to remain standing.  ADL either performed or assessed with clinical judgement   ADL Overall ADL's : Needs  assistance/impaired Eating/Feeding: Sitting;Minimal assistance   Grooming: Maximal assistance;Sitting Grooming Details (indicate cue type and reason): hand over hand assist to use RUE, pt able to complete more with LUE but not thouroghly. Upper Body Bathing: Maximal assistance;Sitting   Lower Body Bathing: Sitting/lateral leans;Maximal assistance                              Extremity/Trunk Assessment Upper Extremity Assessment Upper Extremity Assessment: Generalized weakness RUE Deficits / Details: No active grasp, increased edema compared to L. Min shoulder adduction, some effort to maintain elbow flexion at 90 deg against gravity but cannot place himself in that position. Traps firing for R shld elevation RUE Sensation: decreased light touch;decreased proprioception RUE Coordination: decreased fine motor;decreased gross motor            Vision   Vision Assessment?: Vision impaired- to be further tested in functional context Additional Comments: R neglect, gaze shifts to midline   Perception Perception Preception Impairment Details: Inattention/Neglect Perception-Other Comments: R neglect   Praxis Praxis Praxis: Impaired Praxis Impairment Details: Ideation;Ideomotor   Communication Communication Communication: Impaired Factors Affecting Communication: Difficulty expressing self   Cognition Arousal: Alert Behavior During Therapy: WFL for tasks assessed/performed Cognition: Cognition impaired, Difficult to assess Difficult to assess due to: Impaired communication   Awareness: Intellectual awareness impaired, Online awareness impaired Memory impairment (select all impairments): Working [employer] fast streamer, Copywriter, advertising Attention impairment (select first level of impairment): Focused attention Executive functioning impairment (select all impairments): Initiation, Organization, Sequencing, Problem solving, Reasoning OT - Cognition Comments: Pt with  improving command following                 Following commands: Impaired Following commands impaired: Follows one step commands inconsistently      Cueing   Cueing Techniques: Verbal cues, Tactile cues, Gestural cues  Exercises      Shoulder Instructions       General Comments Spouse and daughter in law present during session. Pt HR up to 122 bpm during standing activities.  Pt with a bit of stool on bed, notified NT    Pertinent Vitals/ Pain       Pain Assessment Pain Assessment: Faces Faces Pain Scale: No hurt  Home Living                                          Prior Functioning/Environment              Frequency  Min 2X/week        Progress Toward Goals  OT Goals(current goals can now be found in the care plan section)  Progress towards OT goals: Progressing toward goals  Acute Rehab OT Goals OT Goal Formulation: Patient unable to participate in goal setting Time For Goal Achievement: 07/15/23 Potential to Achieve Goals: Good  Plan      Co-evaluation    PT/OT/SLP Co-Evaluation/Treatment: Yes Reason for Co-Treatment: Necessary to address cognition/behavior during functional activity;To address functional/ADL transfers;For patient/therapist safety PT goals addressed during session: Mobility/safety with mobility;Balance OT goals addressed during session: ADL's and self-care;Strengthening/ROM      AM-PAC OT 6 Clicks Daily Activity     Outcome Measure   Help from another person eating meals?: A Little (using LUE) Help from  another person taking care of personal grooming?: A Lot Help from another person toileting, which includes using toliet, bedpan, or urinal?: Total Help from another person bathing (including washing, rinsing, drying)?: A Lot Help from another person to put on and taking off regular upper body clothing?: A Lot Help from another person to put on and taking off regular lower body clothing?: Total 6 Click  Score: 11    End of Session Equipment Utilized During Treatment: Gait belt  OT Visit Diagnosis: Unsteadiness on feet (R26.81);Muscle weakness (generalized) (M62.81);Other abnormalities of gait and mobility (R26.89);Low vision, both eyes (H54.2);Apraxia (R48.2);Feeding difficulties (R63.3);Other symptoms and signs involving cognitive function;Cognitive communication deficit (R41.841);Hemiplegia and hemiparesis Symptoms and signs involving cognitive functions: Cerebral infarction Hemiplegia - Right/Left: Right Hemiplegia - caused by: Cerebral infarction   Activity Tolerance Patient tolerated treatment well   Patient Left in bed;with call bell/phone within reach;with bed alarm set;with family/visitor present   Nurse Communication Mobility status        Time: 1244-1310 OT Time Calculation (min): 26 min  Charges: OT General Charges $OT Visit: 1 Visit OT Treatments $Therapeutic Activity: 8-22 mins  07/03/2023  AB, OTR/L  Acute Rehabilitation Services  Office: [phone]   Jorene New 07/03/2023, 2:19 PM

## 2023-07-04 DIAGNOSIS — I4891 Unspecified atrial fibrillation: Secondary | ICD-10-CM | POA: Diagnosis not present

## 2023-07-04 DIAGNOSIS — I69391 Dysphagia following cerebral infarction: Secondary | ICD-10-CM | POA: Diagnosis not present

## 2023-07-04 DIAGNOSIS — I130 Hypertensive heart and chronic kidney disease with heart failure and stage 1 through stage 4 chronic kidney disease, or unspecified chronic kidney disease: Secondary | ICD-10-CM | POA: Diagnosis not present

## 2023-07-04 DIAGNOSIS — I63512 Cerebral infarction due to unspecified occlusion or stenosis of left middle cerebral artery: Secondary | ICD-10-CM | POA: Diagnosis not present

## 2023-07-04 LAB — BASIC METABOLIC PANEL WITH GFR
Anion gap: 11 (ref 5–15)
BUN: 22 mg/dL (ref 8–23)
CO2: 24 mmol/L (ref 22–32)
Calcium: 9.2 mg/dL (ref 8.9–10.3)
Chloride: 106 mmol/L (ref 98–111)
Creatinine, Ser: 1.43 mg/dL — ABNORMAL HIGH (ref 0.61–1.24)
GFR, Estimated: 47 mL/min — ABNORMAL LOW (ref >60)
Glucose, Bld: 151 mg/dL — ABNORMAL HIGH (ref 70–99)
Potassium: 4.2 mmol/L (ref 3.5–5.1)
Sodium: 141 mmol/L (ref 135–145)

## 2023-07-04 LAB — GLUCOSE, CAPILLARY
Glucose-Capillary: 122 mg/dL — ABNORMAL HIGH (ref 70–99)
Glucose-Capillary: 140 mg/dL — ABNORMAL HIGH (ref 70–99)
Glucose-Capillary: 165 mg/dL — ABNORMAL HIGH (ref 70–99)
Glucose-Capillary: 190 mg/dL — ABNORMAL HIGH (ref 70–99)
Glucose-Capillary: 236 mg/dL — ABNORMAL HIGH (ref 70–99)

## 2023-07-04 LAB — CBC
HCT: 41.2 % (ref 39.0–52.0)
Hemoglobin: 13.1 g/dL (ref 13.0–17.0)
MCH: 30.5 pg (ref 26.0–34.0)
MCHC: 31.8 g/dL (ref 30.0–36.0)
MCV: 96 fL (ref 80.0–100.0)
Platelets: 371 10*3/uL (ref 150–400)
RBC: 4.29 MIL/uL (ref 4.22–5.81)
RDW: 15 % (ref 11.5–15.5)
WBC: 7.7 10*3/uL (ref 4.0–10.5)
nRBC: 0 % (ref 0.0–0.2)

## 2023-07-04 MED ORDER — ENSURE PLUS HIGH PROTEIN PO LIQD
237.0000 mL | Freq: Two times a day (BID) | ORAL | Status: DC
  Administered 2023-07-04 – 2023-07-11 (×16): 237 mL via ORAL

## 2023-07-04 NOTE — Progress Notes (Signed)
 Inpatient Rehab Admissions Coordinator:   At this time pt appears to be progressing well with therapy.  Discussed with Mirta Ammon and SNF search in progress.  I do think it's worth discussing CIR with pt/family.  He would likely need 24/7 supervision to min assist after CIR stay.  I will place a consult and an AC will f/u to discuss options with family.   [name], PT, DPT Admissions Coordinator [phone] 07/04/23  2:19 PM

## 2023-07-04 NOTE — Plan of Care (Signed)
  Problem: Education: Goal: Knowledge of disease or condition will improve Outcome: Progressing   Problem: Education: Goal: Knowledge of secondary prevention will improve (MUST DOCUMENT ALL) Outcome: Progressing   Problem: Education: Goal: Knowledge of patient specific risk factors will improve (DELETE if not current risk factor) Outcome: Progressing   Problem: Ischemic Stroke/TIA Tissue Perfusion: Goal: Complications of ischemic stroke/TIA will be minimized Outcome: Progressing   Problem: Coping: Goal: Will verbalize positive feelings about self Outcome: Progressing   Problem: Nutrition: Goal: Risk of aspiration will decrease Outcome: Progressing   Problem: Activity: Goal: Ability to tolerate increased activity will improve Outcome: Progressing   Problem: Respiratory: Goal: Ability to maintain a clear airway and adequate ventilation will improve Outcome: Progressing   Problem: Activity: Goal: Risk for activity intolerance will decrease Outcome: Progressing   Problem: Nutrition: Goal: Adequate nutrition will be maintained Outcome: Progressing   Problem: Coping: Goal: Level of anxiety will decrease Outcome: Progressing

## 2023-07-04 NOTE — Progress Notes (Signed)
 Occupational Therapy Treatment Patient Details Name: [name] MRN: [id number] DOB: July 10, 1933 Today's Date: 07/04/2023   History of present illness 89 y/o male presented to [facility] ED on 06/28/23 with facial droop and R sided weakness. NIH 25 and imaging showed left MCA infarct with M1 occlusion. Transferred to [facility] and s/p INR L CFA approach. EEG suggestive of severe diffuse encephalopathy likely related to dedation.  Intubated 6/7 -6/9. Recent admission at [facility] with  monomorphic V. Tach s/p cardioversion. PMH: permanent Afib, sick sinus syndrome s/p PPM, HFpEF, CKD stage IIIb, HTN, T2DM, stage IVb metastatic prostate cancer, DVT   OT comments  Pt continuing to progress in OT sessions, worked with pt on increasing use of RUE during functional tasks using hand over hand techniques to promote neuro plasticity. Pt RUE remains with similar presentation compared to last OT session, he did display some ability to use lateral pinch and flex digits 1-3 on RUE to hold utensils and cups. His R neglect has been improving with visual stimulation at midline and efforts to restrain pt from using LUE during tasks. OT to continue to progress pt as able. DC plans remain appropriate for CIR.       If plan is discharge home, recommend the following:  Two people to help with walking and/or transfers;Two people to help with bathing/dressing/bathroom;Assistance with cooking/housework;Assistance with feeding;Direct supervision/assist for medications management;Direct supervision/assist for financial management;Assist for transportation;Help with stairs or ramp for entrance;Supervision due to cognitive status   Equipment Recommendations  Other (comment) (defer)    Recommendations for Other Services      Precautions / Restrictions Precautions Precautions: Fall Recall of Precautions/Restrictions: Intact Restrictions Weight Bearing Restrictions Per Provider Order: No       Mobility Bed Mobility                General bed mobility comments: Pt in recliner on arrival    Transfers Overall transfer level: Needs assistance Equipment used: 1 person hand held assist Transfers: Sit to/from Stand Sit to Stand: Min assist           General transfer comment: Min A for STS. pt initiating efforts himself, was able to stand statically with min A, pt using BUEs to push off recliner armrests.     Balance Overall balance assessment: Needs assistance Sitting-balance support: Feet supported       Standing balance support: Bilateral upper extremity supported, Reliant on assistive device for balance Standing balance-Leahy Scale: Poor Standing balance comment: min A static stand                           ADL either performed or assessed with clinical judgement   ADL Overall ADL's : Needs assistance/impaired Eating/Feeding: Minimal assistance;Sitting Eating/Feeding Details (indicate cue type and reason): Pt able to utilize LUE to perform self feeding, Max A for using RUE hand over hand assist to manipulate utensils and bring cup to mouth. Pt able to get thumb, index, and middle fingers to support weight of cup in R hand when placed by OT. Pt initiating lateral pinch when prompted to hold spoon + assist with positioning by OT Grooming: Sitting;Applying deodorant;Maximal assistance Grooming Details (indicate cue type and reason): Max A with gestural/visual cues to apply deodorant to R pit using LUE. Hand over hand to faciliated pt self initiation of spreading it, total hand over hand assist with RUE.  Extremity/Trunk Assessment Upper Extremity Assessment RUE Deficits / Details: No active grasp, increased edema compared to L. Min shoulder adduction, some effort to maintain elbow flexion at 90 deg against gravity but cannot place himself in that position. Traps firing for R shld elevation RUE Sensation: decreased light touch;decreased  proprioception RUE Coordination: decreased fine motor;decreased gross motor            Vision Patient Visual Report: Other (comment) (unable to report) Vision Assessment?: Vision impaired- to be further tested in functional context Additional Comments: R neglect, once brought to midline and with gestures pt making more efforts to use RUE during functional tasks.   Perception Perception Preception Impairment Details: Inattention/Neglect Perception-Other Comments: R neglect   Praxis Praxis Praxis: Impaired Praxis Impairment Details: Ideation;Ideomotor   Communication Communication Communication: Impaired Factors Affecting Communication: Difficulty expressing self   Cognition Arousal: Alert Behavior During Therapy: Restless Cognition: Cognition impaired, Difficult to assess Difficult to assess due to: Impaired communication   Awareness: Intellectual awareness impaired, Online awareness impaired Memory impairment (select all impairments): Working [employer] fast streamer, Copywriter, advertising Attention impairment (select first level of impairment): Focused attention, Sustained attention Executive functioning impairment (select all impairments): Initiation, Organization, Sequencing, Problem solving, Reasoning                   Following commands: Impaired Following commands impaired: Follows one step commands with increased time, Follows multi-step commands inconsistently      Cueing   Cueing Techniques: Verbal cues, Tactile cues, Gestural cues  Exercises Other Exercises Other Exercises: PNF D1 flexion/ext PROM RUE Other Exercises: Reaching for object from floor then bringing it to lap using RUE to engage scapular retraction and biceps flexoin    Shoulder Instructions       General Comments VSS    Pertinent Vitals/ Pain       Pain Assessment Pain Assessment: Faces Faces Pain Scale: No hurt Pain Intervention(s): Monitored during session  Home Living                                           Prior Functioning/Environment              Frequency  Min 2X/week        Progress Toward Goals  OT Goals(current goals can now be found in the care plan section)  Progress towards OT goals: Progressing toward goals  Acute Rehab OT Goals OT Goal Formulation: Patient unable to participate in goal setting Time For Goal Achievement: 07/15/23 Potential to Achieve Goals: Good  Plan      Co-evaluation                 AM-PAC OT 6 Clicks Daily Activity     Outcome Measure   Help from another person eating meals?: A Little (using LUE) Help from another person taking care of personal grooming?: A Lot Help from another person toileting, which includes using toliet, bedpan, or urinal?: Total Help from another person bathing (including washing, rinsing, drying)?: A Lot Help from another person to put on and taking off regular upper body clothing?: A Lot Help from another person to put on and taking off regular lower body clothing?: A Lot 6 Click Score: 12    End of Session    OT Visit Diagnosis: Unsteadiness on feet (R26.81);Muscle weakness (generalized) (M62.81);Other abnormalities of gait and mobility (R26.89);Low vision, both eyes (H54.2);Apraxia (  R48.2);Feeding difficulties (R63.3);Other symptoms and signs involving cognitive function;Cognitive communication deficit (R41.841);Hemiplegia and hemiparesis Symptoms and signs involving cognitive functions: Cerebral infarction Hemiplegia - Right/Left: Right Hemiplegia - caused by: Cerebral infarction   Activity Tolerance Patient tolerated treatment well   Patient Left in chair;with call bell/phone within reach;with chair alarm set   Nurse Communication Mobility status        Time: 1340-1415 OT Time Calculation (min): 35 min  Charges: OT General Charges $OT Visit: 1 Visit OT Treatments $Self Care/Home Management : 8-22 mins $Therapeutic Activity: 8-22  mins  07/04/2023  AB, OTR/L  Acute Rehabilitation Services  Office: [phone]   Jorene New 07/04/2023, 4:37 PM

## 2023-07-04 NOTE — Plan of Care (Signed)
  Problem: Ischemic Stroke/TIA Tissue Perfusion: Goal: Complications of ischemic stroke/TIA will be minimized Outcome: Progressing   Problem: Health Behavior/Discharge Planning: Goal: Goals will be collaboratively established with patient/family Outcome: Progressing

## 2023-07-04 NOTE — Plan of Care (Signed)
   Problem: Ischemic Stroke/TIA Tissue Perfusion: Goal: Complications of ischemic stroke/TIA will be minimized Outcome: Progressing

## 2023-07-04 NOTE — TOC Progression Note (Signed)
 Transition of Care [facility]) - Progression Note    Patient Details  Name: [name] MRN: [id number] Date of Birth: 1933-09-03  Transition of Care [name] CM/SW Contact  Tandy Fam, [place] Phone Number: 07/04/2023, 2:53 PM  Clinical Narrative:   CSW spoke with daughter, [name], to discuss SNF options. Family is still reviewing, but leaning towards [facility] and would like to go tour. CSW answered questions about touring SNF and insurance copays, provided daughter with copy of patient's insurance card to contact for copay information. CSW to follow.    Expected Discharge Plan: Skilled Nursing Facility Barriers to Discharge: Continued Medical Work up, English as a second language teacher  Expected Discharge Plan and Services       Living arrangements for the past 2 months: Single Family Home                                       Social Determinants of Health (SDOH) Interventions SDOH Screenings   Food Insecurity: No Food Insecurity (06/23/2023)  Housing: Low Risk  (06/23/2023)  Transportation Needs: No Transportation Needs (06/23/2023)  Utilities: Not At Risk (06/23/2023)  Alcohol Screen: Low Risk  (10/26/2021)  Depression (PHQ2-9): Low Risk  (12/26/2022)  Financial Resource Strain: Low Risk  (10/29/2019)  Recent Concern: Financial Resource Strain - Medium Risk (09/08/2019)  Physical Activity: Inactive (10/29/2019)  Social Connections: Moderately Integrated (06/23/2023)  Stress: No Stress Concern Present (10/29/2019)  Recent Concern: Stress - Stress Concern Present (09/08/2019)  Tobacco Use: Medium Risk (06/28/2023)    Readmission Risk Interventions    07/01/2023    4:26 PM 09/11/2022    1:23 PM 12/26/2020   12:08 PM  Readmission Risk Prevention Plan  Transportation Screening Complete Complete Complete  PCP or Specialist Appt within 3-5 Days   Complete  HRI or Home Care Consult   Complete  Social Work Consult for Recovery Care Planning/Counseling   Complete  Palliative Care  Screening   Not Applicable  Medication Review Oceanographer) Complete Referral to Pharmacy   PCP or Specialist appointment within 3-5 days of discharge Complete Complete   HRI or Home Care Consult Complete Complete   SW Recovery Care/Counseling Consult Complete Complete   Palliative Care Screening Not Applicable Not Applicable   Skilled Nursing Facility Not Applicable Not Applicable

## 2023-07-04 NOTE — Progress Notes (Addendum)
 STROKE TEAM PROGRESS NOTE    SIGNIFICANT HOSPITAL EVENTS 6/7-patient admitted, taken to interventional radiology with successful revascularization of left MCA M1 segment 6/10: MRI brain with evolving acute to early subacute ischemic left MCA infarct 6/11: Passed MBS. Transferred out of ICU.   INTERIM HISTORY/SUBJECTIVE  No family at the bedside  CT abd/chest with No definitive bony metastatic disease .  Neurological exam remains stable and unchanged. Labs and vitals are stable    CBC    Component Value Date/Time   WBC 7.7 07/04/2023 0810   RBC 4.29 07/04/2023 0810   HGB 13.1 07/04/2023 0810   HGB 15.2 04/29/2023 1259   HGB 14.6 06/11/2019 0953   HCT 41.2 07/04/2023 0810   HCT 46.0 06/11/2019 0953   PLT 371 07/04/2023 0810   PLT 189 04/29/2023 1259   PLT 154 06/11/2019 0953   MCV 96.0 07/04/2023 0810   MCV 94 06/11/2019 0953   MCV 92 10/13/2013 0415   MCH 30.5 07/04/2023 0810   MCHC 31.8 07/04/2023 0810   RDW 15.0 07/04/2023 0810   RDW 13.3 06/11/2019 0953   RDW 15.3 (H) 10/13/2013 0415   LYMPHSABS 0.3 (L) 06/29/2023 0458   LYMPHSABS 2.4 10/13/2013 0415   MONOABS 1.0 06/29/2023 0458   MONOABS 0.8 10/13/2013 0415   EOSABS 0.1 06/29/2023 0458   EOSABS 0.2 10/13/2013 0415   BASOSABS 0.0 06/29/2023 0458   BASOSABS 0.0 10/13/2013 0415    BMET    Component Value Date/Time   NA 141 07/04/2023 0810   NA 144 10/11/2021 0936   NA 138 10/13/2013 0415   K 4.2 07/04/2023 0810   K 4.0 10/13/2013 0415   CL 106 07/04/2023 0810   CL 106 10/13/2013 0415   CO2 24 07/04/2023 0810   CO2 27 10/13/2013 0415   GLUCOSE 151 (H) 07/04/2023 0810   GLUCOSE 97 10/13/2013 0415   BUN 22 07/04/2023 0810   BUN 46 (H) 10/11/2021 0936   BUN 22 (H) 10/13/2013 0415   CREATININE 1.43 (H) 07/04/2023 0810   CREATININE 2.17 (H) 04/29/2023 1259   CREATININE 1.65 (H) 10/13/2013 0415   CALCIUM  9.2 07/04/2023 0810   CALCIUM  8.2 (L) 10/13/2013 0415   EGFR 22 (L) 10/11/2021 0936   GFRNONAA 47 (L)  07/04/2023 0810   GFRNONAA 29 (L) 04/29/2023 1259   GFRNONAA 39 (L) 10/13/2013 0415    IMAGING past 24 hours CT CHEST ABDOMEN PELVIS W CONTRAST Result Date: 07/03/2023 CLINICAL DATA:  History of prostate carcinoma, evaluate for metastatic disease EXAM: CT CHEST, ABDOMEN, AND PELVIS WITH CONTRAST TECHNIQUE: Multidetector CT imaging of the chest, abdomen and pelvis was performed following the standard protocol during bolus administration of intravenous contrast. RADIATION DOSE REDUCTION: This exam was performed according to the departmental dose-optimization program which includes automated exposure control, adjustment of the mA and/or kV according to patient size and/or use of iterative reconstruction technique. CONTRAST:  75mL OMNIPAQUE  IOHEXOL  350 MG/ML SOLN COMPARISON:  07/23/2022 FINDINGS: CT CHEST FINDINGS Cardiovascular: Atherosclerotic calcifications of the aorta are noted. No aneurysmal dilatation or dissection is seen. Heart is mildly enlarged in size. The pulmonary artery shows no evidence of pulmonary emboli. Mild coronary calcifications are seen. Mediastinum/Nodes: Thoracic inlet is within normal limits. No hilar or mediastinal adenopathy is noted. The esophagus as visualized is within normal limits. Lungs/Pleura: Small bilateral pleural effusions and lower lobe atelectatic changes are seen. No focal confluent infiltrate is seen. No parenchymal nodules are noted. Musculoskeletal: No acute rib abnormality is noted. No sclerotic lesions  are identified to suggest metastatic disease. CT ABDOMEN PELVIS FINDINGS Hepatobiliary: Gallbladder is within normal limits. Liver demonstrates a cystic lesion anteriorly stable from the prior exam. Additionally a isodense liver lesion on the right is noted stable in appearance from the prior exam. This measures up to 4.6 cm and has been stable for multiple previous exams back at least to 2021. Pancreas: Unremarkable. No pancreatic ductal dilatation or surrounding  inflammatory changes. Spleen: Normal in size without focal abnormality. Adrenals/Urinary Tract: Adrenal glands are within normal limits. Kidneys demonstrate multiple small nonobstructing stones. The largest of these lies in the left kidney measuring 4 mm. No obstructive changes are seen. No ureteral stones are noted. The bladder is decompressed. Stomach/Bowel: Scattered diverticular change of the colon is noted. No obstructive or inflammatory changes of the colon are noted. The appendix is not well visualized and may have been surgically removed. Small bowel and stomach are within normal limits. Vascular/Lymphatic: Stable dilatation of the aorta to 3.2 cm is noted. No significant lymphadenopathy is noted. Reproductive: Prostate has been surgically removed. Other: No abdominal wall hernia or abnormality. No abdominopelvic ascites. Musculoskeletal: Bony structures show no definitive sclerotic metastatic disease. IMPRESSION: No definitive bony metastatic disease is noted. Bone scan may be more sensitive. Stable dilatation of the abdominal aorta to 3.2 cm. Recommend follow-up ultrasound every 3 years. (Ref.: J Vasc Surg. 2018; 67:2-77 and J Am Coll Radiol 2013;10(10):789-794.) Bilateral small effusions and lower lobe atelectatic changes. Multiple nonobstructing renal calculi. Diverticulosis without diverticulitis. Electronically Signed   By: Violeta Grey M.D.   On: 07/03/2023 21:40     Vitals:   07/03/23 2327 07/04/23 0432 07/04/23 0800 07/04/23 1208  BP: 129/87 (!) 119/91 (!) 151/52 (!) 140/95  Pulse: 89 (!) 103 69 87  Resp: 18 18 20 17   Temp: 99.1 F (37.3 C) 99.1 F (37.3 C) 97.6 F (36.4 C) 98.9 F (37.2 C)  TempSrc: Oral Oral Oral Oral  SpO2: 96% 96% 97% 97%  Weight:      Height:        PHYSICAL EXAM General:Elderly patient in no acute distress Psych:  Mood and affect appropriate for situation.  Calm and cooperative CV: Continued A-fib on the monitor Respiratory: Regular, unlabored  breathing, room air  NEURO  Eyes open spontaneously, global aphasia. Cough, gag, corneal present. EOMI, tracks examiner.  PERRL Grunting and incomprehensible sounds only. Is able to lift and hold right upper extremity off bed spontaneously, left arm normal strength  moves bilateral lower extremities and left upper extremity spontaneously    ASSESSMENT/PLAN  [name] is a 89 y.o. male with history of A-fib on edoxaban  (switched to edoxaban  as Eliquis  interacted with prostate cancer treatment), CHF, left MCA stroke in 2021 status post thrombectomy, diabetes, hypertension, hyperlipidemia, sleep apnea on CPAP, CKD stage IIIb and prostate cancer on Xtandi  and leuprolide  admitted for acute onset right facial droop, right-sided weakness and altered mental status.  He was found to have left MCA M1 occlusion and was taken to interventional radiology for successful mechanical thrombectomy.  He remained intubated after the procedure.  Extubated 6/9.  NIH on Admission 26  Stroke:  left MCA infarct with left M1 occlusion s/p IR with TICI3, etiology likely A-fib on DOAC in the setting of recent cardioversion Code Stroke CT head stable acute cytotoxic edema in left MCA territory ASPECTS 7 CTA head & neck recurrent left MCA M1 occlusion, left VA moderate stenosis at origin, right VA occlusion Status post IR with [facility]  Follow-up CT Evolving left MCA territory infarct with mild edema and sulcal  effacement. No midline shift. Foci of hyperattenuation in the posterior left temporal lobe suggestive of developing petechial hemorrhage. Hyperdense appearance of the distal left M1 segment and left M2  MRI Evolving acute to early subacute ischemic nonhemorrhagic left MCA infarct. Pan CT showed no advance malignancy  2D Echo EF 45 to 50% LDL 32 HgbA1c 8.7 VTE prophylaxis - Edoxaban  Edoxaban  prior to admission, now resumed Edoxaban  Therapy recommendations: CIR but likely SNF given lack of home  support Disposition: Pending  Hx of Stroke/TIA 08/2019 admitted for left BG/CR and caudate infarct.  CTA head and neck left M1 occlusion, right MCA occlusion.  CTP 0/98 cc.  S/p IR with TICI3.  MRI post IR showed left M1 patent.  EF 60 to 65%.  LDL 27, A1c 7.8.  Discharged on aspirin  325 and Lipitor 10.  Recommend discussed with patient cardiologist regarding DOAC.  Atrial fibrillation Was on pradaxa , then changed to eliquis  - ? Due to rectal bleeding ? AC was held during 06/2019 [facility] admission due to rectal bleeding and high-risk of recurrent bleeding.  CHADS-VASC score of 5 at that time, with 7.2% risk of stroke per year, but need to weigh risk of stroke versus risk of recurrent GI bleeding with Eliquis . At the time of discharge, a mutual decision was made with patient, family, and medical team to not restart anticoagulation for fear of recurrence of life-threatening bleeding Stroke in 08/2019 as above, on discharge patient and family reluctant to start DOAC, therefore they were recommended to discuss with his cardiologist regarding DOAC as outpatient. Was put back on eliquis  but then changed to edoxaban  as Eliquis  has interaction with prostate cancer medications. Current home meds including amiodarone  200 mg daily, verapamil  180 mg daily, edoxaban  30 mg daily Had cardiac cath and cardioversion 2 days prior to admission Still in afib RVR with pacing, continue telemetry Continue home amiodarone  and verapamil   Amio drip-->transitioned to PO 6/10 Edoxaban  restarted  Not Watchman device candidate at this time, may consider if significant neuro improvement.    Hypertension Home meds: Verapamil , on hold Stable Long-term BP goal normotensive  Hyperlipidemia Home meds: Atorvastatin  10 mg daily, continue LDL 32, goal < 70 High intensity statin not indicated as LDL below goal Continue statin at discharge  Diabetes type II poorly controlled Home meds:  Farxiga  10 mg daily HgbA1c 8.7, goal <  7.0 CBGs SSI Recommend close follow-up with PCP for better DM control  Dysphagia Patient has post-stroke dysphagia SLP following Failed bedside swallow evaluation MBS passed 6/11 on DYS1 and thin liquids  Other Stroke Risk Factors Obesity, Body mass index is 31.27 kg/m., BMI >/= 30 associated with increased stroke risk, recommend weight loss, diet and exercise as appropriate  Obstructive sleep apnea, on CPAP at home CHF  Other Active Problems Prostate cancer-plan to resume home Xtandi , per pharmacy History of rectal bleeding CKD 3B, creatinine 2.26--1.82-1.33-1.47-1.51--1.48-1.43  Hospital day # 6  Patient seen by NP with MD, MD to edit note as needed.  Jonette Nestle DNP, ACNPC-AG  [facility]  ATTENDING NOTE: I reviewed above note and agree with the assessment and plan. Pt was seen and examined.   No family at bedside.  Patient sitting in chair, still has global aphasia and right-sided hemiparesis.  Pan CT last night showed no advanced malignancy.  On edoxaban  and statin.  Pending SNF.  For detailed assessment and plan, please refer to above as I have made  changes wherever appropriate.   I had long discussion with daughter over the phone, updated pt current condition, treatment plan and potential prognosis, and answered all the questions.  She expressed understanding and appreciation.    Consuelo Denmark, MD PhD Stroke Neurology 07/04/2023 6:41 PM

## 2023-07-04 NOTE — Progress Notes (Signed)
 Physical Therapy Treatment Patient Details Name: [name] MRN: [id number] DOB: 1933/09/06 Today's Date: 07/04/2023   History of Present Illness 89 y/o male presented to [facility] ED on 06/28/23 with facial droop and R sided weakness. NIH 25 and imaging showed left MCA infarct with M1 occlusion. Transferred to [facility] and s/p INR L CFA approach. EEG suggestive of severe diffuse encephalopathy likely related to dedation.  Intubated 6/7 -6/9. Recent admission at [facility] with  monomorphic V. Tach s/p cardioversion. PMH: permanent Afib, sick sinus syndrome s/p PPM, HFpEF, CKD stage IIIb, HTN, T2DM, stage IVb metastatic prostate cancer, DVT    PT Comments  Pt is continuously progressing towards goals. Currently pt is Min A for bed mobility, 2 person Min A for sit to stand from EOB and 2 person Mod A for step pivot transfers. Pt was able to clear bil LE from floor for short steps from EOB to recliner. Due to pt current functional status, home set up and available assistance at home recommending skilled physical therapy services > 3 hours/day in order to address strength, balance and functional mobility to decrease risk for falls, injury, immobility, skin break down and re-hospitalization.      If plan is discharge home, recommend the following: Two people to help with walking and/or transfers;Assistance with cooking/housework;Assist for transportation;Supervision due to cognitive status;Help with stairs or ramp for entrance   Can travel by private vehicle     No  Equipment Recommendations  Wheelchair cushion (measurements PT);Wheelchair (measurements PT);BSC/3in1       Precautions / Restrictions Precautions Precautions: Fall Recall of Precautions/Restrictions: Intact Restrictions Weight Bearing Restrictions Per Provider Order: No     Mobility  Bed Mobility Overal bed mobility: Needs Assistance Bed Mobility: Supine to Sit     Supine to sit: Min assist     General bed mobility comments: good  initiation. Min A to get to EOB    Transfers Overall transfer level: Needs assistance Equipment used: 2 person hand held assist Transfers: Sit to/from Stand, Bed to chair/wheelchair/BSC Sit to Stand: Min assist, +2 physical assistance, +2 safety/equipment   Step pivot transfers: Mod assist, +2 safety/equipment, +2 physical assistance       General transfer comment: To person Min A for initial momentum to get to standing. 2 person Mod A for step pivot transfer with Mod A for transfers for foot clearance this session. pt was able to clear bil LE with RLE clearance better than LLE for foot progression.    Ambulation/Gait   Pre-gait activities: 2 person Mod A for steps from EOB to recliner with Mod A for transfers for foot clearance this session. pt was able to clear bil LE with RLE clearance better than LLE for foot progression. General Gait Details: deferred   Modified Rankin (Stroke Patients Only) Modified Rankin (Stroke Patients Only) Pre-Morbid Rankin Score: No significant disability Modified Rankin: Severe disability     Balance Overall balance assessment: Needs assistance Sitting-balance support: Feet supported Sitting balance-Leahy Scale: Fair Sitting balance - Comments: SBA EOB   Standing balance support: Bilateral upper extremity supported, Reliant on assistive device for balance Standing balance-Leahy Scale: Poor Standing balance comment: Mod A +2 for standing      Communication Communication Communication: Impaired Factors Affecting Communication: Difficulty expressing self  Cognition Arousal: Alert Behavior During Therapy: WFL for tasks assessed/performed   PT - Cognitive impairments: Difficult to assess Difficult to assess due to: Impaired communication       PT - Cognition Comments: Impaired expressive  and receptive communication, attempting to verbalize. Good command following Following commands: Impaired Following commands impaired: Follows one step  commands with increased time, Follows multi-step commands inconsistently    Cueing Cueing Techniques: Verbal cues, Tactile cues, Gestural cues     General Comments General comments (skin integrity, edema, etc.): No signs/symptoms of cardiac/respiratory distress during session. Pt assisted with pericare duet o BM on arrival.      Pertinent Vitals/Pain Pain Assessment Pain Assessment: No/denies pain Pain Intervention(s): Monitored during session     PT Goals (current goals can now be found in the care plan section) Acute Rehab PT Goals Patient Stated Goal: unable PT Goal Formulation: With patient/family Time For Goal Achievement: 07/15/23 Potential to Achieve Goals: Fair Progress towards PT goals: Progressing toward goals    Frequency    Min 3X/week      PT Plan  Continue with current POC        AM-PAC PT 6 Clicks Mobility   Outcome Measure  Help needed turning from your back to your side while in a flat bed without using bedrails?: A Little Help needed moving from lying on your back to sitting on the side of a flat bed without using bedrails?: A Little Help needed moving to and from a bed to a chair (including a wheelchair)?: A Lot Help needed standing up from a chair using your arms (e.g., wheelchair or bedside chair)?: A Lot Help needed to walk in hospital room?: Total Help needed climbing 3-5 steps with a railing? : Total 6 Click Score: 12    End of Session Equipment Utilized During Treatment: Gait belt Activity Tolerance: Patient tolerated treatment well Patient left: with call bell/phone within reach;in bed;with bed alarm set;with family/visitor present Nurse Communication: Mobility status PT Visit Diagnosis: Unsteadiness on feet (R26.81);Muscle weakness (generalized) (M62.81);Difficulty in walking, not elsewhere classified (R26.2);Hemiplegia and hemiparesis Hemiplegia - Right/Left: Right Hemiplegia - dominant/non-dominant: Dominant Hemiplegia - caused by:  Cerebral infarction     Time: 1209-1223 PT Time Calculation (min) (ACUTE ONLY): 14 min  Charges:    $Therapeutic Activity: 8-22 mins PT General Charges $$ ACUTE PT VISIT: 1 Visit                     Sloan Duncans, DPT, CLT  Acute Rehabilitation Services Office: [phone] (Secure chat preferred)    Jenice Mitts 07/04/2023, 2:34 PM

## 2023-07-05 ENCOUNTER — Inpatient Hospital Stay (HOSPITAL_COMMUNITY)

## 2023-07-05 DIAGNOSIS — R310 Gross hematuria: Secondary | ICD-10-CM | POA: Diagnosis not present

## 2023-07-05 LAB — BASIC METABOLIC PANEL WITH GFR
Anion gap: 9 (ref 5–15)
BUN: 25 mg/dL — ABNORMAL HIGH (ref 8–23)
CO2: 26 mmol/L (ref 22–32)
Calcium: 9.1 mg/dL (ref 8.9–10.3)
Chloride: 106 mmol/L (ref 98–111)
Creatinine, Ser: 1.52 mg/dL — ABNORMAL HIGH (ref 0.61–1.24)
GFR, Estimated: 44 mL/min — ABNORMAL LOW (ref >60)
Glucose, Bld: 158 mg/dL — ABNORMAL HIGH (ref 70–99)
Potassium: 3.9 mmol/L (ref 3.5–5.1)
Sodium: 141 mmol/L (ref 135–145)

## 2023-07-05 LAB — URINALYSIS, ROUTINE W REFLEX MICROSCOPIC
Bilirubin Urine: NEGATIVE
Glucose, UA: 50 mg/dL — AB
Ketones, ur: NEGATIVE mg/dL
Nitrite: NEGATIVE
Protein, ur: 100 mg/dL — AB
RBC / HPF: >50 RBC/hpf (ref 0–5)
Specific Gravity, Urine: 1.02 (ref 1.005–1.030)
WBC, UA: >50 WBC/hpf (ref 0–5)
pH: 7 (ref 5.0–8.0)

## 2023-07-05 LAB — GLUCOSE, CAPILLARY
Glucose-Capillary: 148 mg/dL — ABNORMAL HIGH (ref 70–99)
Glucose-Capillary: 172 mg/dL — ABNORMAL HIGH (ref 70–99)
Glucose-Capillary: 181 mg/dL — ABNORMAL HIGH (ref 70–99)
Glucose-Capillary: 200 mg/dL — ABNORMAL HIGH (ref 70–99)
Glucose-Capillary: 259 mg/dL — ABNORMAL HIGH (ref 70–99)
Glucose-Capillary: 285 mg/dL — ABNORMAL HIGH (ref 70–99)

## 2023-07-05 LAB — CBC
HCT: 40 % (ref 39.0–52.0)
Hemoglobin: 12.7 g/dL — ABNORMAL LOW (ref 13.0–17.0)
MCH: 30.2 pg (ref 26.0–34.0)
MCHC: 31.8 g/dL (ref 30.0–36.0)
MCV: 95.2 fL (ref 80.0–100.0)
Platelets: 346 10*3/uL (ref 150–400)
RBC: 4.2 MIL/uL — ABNORMAL LOW (ref 4.22–5.81)
RDW: 15.1 % (ref 11.5–15.5)
WBC: 7.9 10*3/uL (ref 4.0–10.5)
nRBC: 0 % (ref 0.0–0.2)

## 2023-07-05 NOTE — Plan of Care (Signed)
 Sat up in the chair for several hours, per his choice.  More alert and talkative.  Family at side.  No complaints.    Problem: Education: Goal: Knowledge of secondary prevention will improve (MUST DOCUMENT ALL) Outcome: Progressing   Problem: Nutrition: Goal: Risk of aspiration will decrease Outcome: Progressing   Problem: Nutrition: Goal: Dietary intake will improve Outcome: Progressing   Problem: Clinical Measurements: Goal: Cardiovascular complication will be avoided Outcome: Progressing   Problem: Activity: Goal: Risk for activity intolerance will decrease Outcome: Progressing

## 2023-07-05 NOTE — Plan of Care (Signed)
  Problem: Respiratory: Goal: Ability to maintain a clear airway and adequate ventilation will improve Outcome: Progressing   Problem: Nutrition: Goal: Risk of aspiration will decrease Outcome: Progressing Goal: Dietary intake will improve Outcome: Progressing   Problem: Clinical Measurements: Goal: Ability to maintain clinical measurements within normal limits will improve Outcome: Progressing Goal: Will remain free from infection Outcome: Progressing Goal: Diagnostic test results will improve Outcome: Progressing Goal: Respiratory complications will improve Outcome: Progressing Goal: Cardiovascular complication will be avoided Outcome: Progressing   Problem: Elimination: Goal: Will not experience complications related to bowel motility Outcome: Progressing Goal: Will not experience complications related to urinary retention Outcome: Progressing   Problem: Pain Managment: Goal: General experience of comfort will improve and/or be controlled Outcome: Progressing   Problem: Safety: Goal: Ability to remain free from injury will improve Outcome: Progressing   Problem: Skin Integrity: Goal: Risk for impaired skin integrity will decrease Outcome: Progressing

## 2023-07-05 NOTE — Progress Notes (Signed)
 Physical Therapy Treatment Patient Details Name: [name] MRN: [id number] DOB: [birth year] Today's Date: 07/05/2023   History of Present Illness 89 y/o male presented to [facility] ED on 06/28/23 with facial droop and R sided weakness. NIH 25 and imaging showed left MCA infarct with M1 occlusion. Transferred to [facility] and s/p INR L CFA approach. EEG suggestive of severe diffuse encephalopathy likely related to dedation.  Intubated 6/7 -6/9. Recent admission at [facility] with  monomorphic V. Tach s/p cardioversion. PMH: permanent Afib, sick sinus syndrome s/p PPM, HFpEF, CKD stage IIIb, HTN, T2DM, stage IVb metastatic prostate cancer, DVT    PT Comments  Pt progressing well with mobility. Min assist bed mobility, mod assist lateral scoot bed to recliner, and mod assist STS with RW. Fair sitting balance and poor standing balance. R inattention requiring multimodal cues to tend to positioning of RUE. Pt in recliner with feet elevated at end of session.     If plan is discharge home, recommend the following: Two people to help with walking and/or transfers;Assistance with cooking/housework;Assist for transportation;Supervision due to cognitive status;Help with stairs or ramp for entrance   Can travel by private vehicle     No  Equipment Recommendations  Wheelchair cushion (measurements PT);Wheelchair (measurements PT);BSC/3in1    Recommendations for Other Services       Precautions / Restrictions Precautions Precautions: Fall Recall of Precautions/Restrictions: Impaired     Mobility  Bed Mobility Overal bed mobility: Needs Assistance Bed Mobility: Supine to Sit     Supine to sit: Min assist, HOB elevated, Used rails     General bed mobility comments: increased time    Transfers Overall transfer level: Needs assistance Equipment used: Rolling walker (2 wheels) Transfers: Sit to/from Stand, Bed to chair/wheelchair/BSC Sit to Stand: Mod assist          Lateral/Scoot Transfers: Mod  assist General transfer comment: lateral scoot bed to recliner toward L. STS from recliner with RW.    Ambulation/Gait                   Stairs             Wheelchair Mobility     Tilt Bed    Modified Rankin (Stroke Patients Only) Modified Rankin (Stroke Patients Only) Pre-Morbid Rankin Score: No significant disability Modified Rankin: Severe disability     Balance Overall balance assessment: Needs assistance Sitting-balance support: Feet supported, Single extremity supported Sitting balance-Leahy Scale: Fair     Standing balance support: Bilateral upper extremity supported, Reliant on assistive device for balance Standing balance-Leahy Scale: Poor                              Communication Communication Communication: Impaired Factors Affecting Communication: Difficulty expressing self;Reduced clarity of speech  Cognition Arousal: Alert Behavior During Therapy: WFL for tasks assessed/performed   PT - Cognitive impairments: Difficult to assess Difficult to assess due to: Impaired communication                       Following commands: Impaired Following commands impaired: Follows one step commands with increased time, Only follows one step commands consistently    Cueing Cueing Techniques: Verbal cues, Tactile cues, Gestural cues  Exercises      General Comments General comments (skin integrity, edema, etc.): VSS on RA      Pertinent Vitals/Pain Pain Assessment Pain Assessment: Faces Faces Pain Scale: No hurt  Home Living                          Prior Function            PT Goals (current goals can now be found in the care plan section) Acute Rehab PT Goals Patient Stated Goal: unable to state Progress towards PT goals: Progressing toward goals    Frequency    Min 3X/week      PT Plan      Co-evaluation              AM-PAC PT 6 Clicks Mobility   Outcome Measure  Help needed  turning from your back to your side while in a flat bed without using bedrails?: A Little Help needed moving from lying on your back to sitting on the side of a flat bed without using bedrails?: A Lot Help needed moving to and from a bed to a chair (including a wheelchair)?: A Lot Help needed standing up from a chair using your arms (e.g., wheelchair or bedside chair)?: A Lot Help needed to walk in hospital room?: Total Help needed climbing 3-5 steps with a railing? : Total 6 Click Score: 11    End of Session Equipment Utilized During Treatment: Gait belt Activity Tolerance: Patient tolerated treatment well Patient left: in chair;with chair alarm set;with call bell/phone within reach Nurse Communication: Mobility status PT Visit Diagnosis: Unsteadiness on feet (R26.81);Muscle weakness (generalized) (M62.81);Difficulty in walking, not elsewhere classified (R26.2);Hemiplegia and hemiparesis Hemiplegia - Right/Left: Right Hemiplegia - dominant/non-dominant: Dominant Hemiplegia - caused by: Cerebral infarction     Time: 0939-1002 PT Time Calculation (min) (ACUTE ONLY): 23 min  Charges:    $Therapeutic Activity: 23-37 mins PT General Charges $$ ACUTE PT VISIT: 1 Visit                     Dorothye Gathers., PT  Office # [phone]    Guadelupe Leech 07/05/2023, 10:11 AM

## 2023-07-05 NOTE — Plan of Care (Signed)
 On-call overnight note  Notified by RN that patient is having dark urine in the day but now has some blood streaking. Ordered a UA and an abdominal x-ray. May need an abdominal ultrasound or CT-will wait for the UA and the x-ray to come back.  Will follow  -- Tona Francis, MD Neurologist [facility]

## 2023-07-05 NOTE — Progress Notes (Addendum)
 STROKE TEAM PROGRESS NOTE    SIGNIFICANT HOSPITAL EVENTS 6/7-patient admitted, taken to interventional radiology with successful revascularization of left MCA M1 segment 6/10: MRI brain with evolving acute to early subacute ischemic left MCA infarct 6/11: Passed MBS. Transferred out of ICU.   INTERIM HISTORY/SUBJECTIVE No family at the bedside.  Therapy is at the bedside working with patient.  He is awake alert.  No new neurological events overnight Labs and vitals are stable.  Waiting for bed available on CIR  CBC    Component Value Date/Time   WBC 7.9 07/05/2023 0535   RBC 4.20 (L) 07/05/2023 0535   HGB 12.7 (L) 07/05/2023 0535   HGB 15.2 04/29/2023 1259   HGB 14.6 06/11/2019 0953   HCT 40.0 07/05/2023 0535   HCT 46.0 06/11/2019 0953   PLT 346 07/05/2023 0535   PLT 189 04/29/2023 1259   PLT 154 06/11/2019 0953   MCV 95.2 07/05/2023 0535   MCV 94 06/11/2019 0953   MCV 92 10/13/2013 0415   MCH 30.2 07/05/2023 0535   MCHC 31.8 07/05/2023 0535   RDW 15.1 07/05/2023 0535   RDW 13.3 06/11/2019 0953   RDW 15.3 (H) 10/13/2013 0415   LYMPHSABS 0.3 (L) 06/29/2023 0458   LYMPHSABS 2.4 10/13/2013 0415   MONOABS 1.0 06/29/2023 0458   MONOABS 0.8 10/13/2013 0415   EOSABS 0.1 06/29/2023 0458   EOSABS 0.2 10/13/2013 0415   BASOSABS 0.0 06/29/2023 0458   BASOSABS 0.0 10/13/2013 0415    BMET    Component Value Date/Time   NA 141 07/05/2023 0535   NA 144 10/11/2021 0936   NA 138 10/13/2013 0415   K 3.9 07/05/2023 0535   K 4.0 10/13/2013 0415   CL 106 07/05/2023 0535   CL 106 10/13/2013 0415   CO2 26 07/05/2023 0535   CO2 27 10/13/2013 0415   GLUCOSE 158 (H) 07/05/2023 0535   GLUCOSE 97 10/13/2013 0415   BUN 25 (H) 07/05/2023 0535   BUN 46 (H) 10/11/2021 0936   BUN 22 (H) 10/13/2013 0415   CREATININE 1.52 (H) 07/05/2023 0535   CREATININE 2.17 (H) 04/29/2023 1259   CREATININE 1.65 (H) 10/13/2013 0415   CALCIUM  9.1 07/05/2023 0535   CALCIUM  8.2 (L) 10/13/2013 0415   EGFR  22 (L) 10/11/2021 0936   GFRNONAA 44 (L) 07/05/2023 0535   GFRNONAA 29 (L) 04/29/2023 1259   GFRNONAA 39 (L) 10/13/2013 0415    IMAGING past 24 hours No results found.    Vitals:   07/04/23 2357 07/05/23 0014 07/05/23 0428 07/05/23 0739  BP: (!) 142/101 (!) 124/90 (!) 150/88 114/84  Pulse: 98 96 92 97  Resp: 19  16 16   Temp: 98.4 F (36.9 C)  98.1 F (36.7 C) 98.1 F (36.7 C)  TempSrc: Oral  Oral Oral  SpO2: 97% 99% 94% 91%  Weight:      Height:        PHYSICAL EXAM General:Elderly patient in no acute distress Psych:  Mood and affect appropriate for situation.  Calm and cooperative CV: Continued A-fib on the monitor Respiratory: Regular, unlabored breathing, room air  NEURO  Eyes open spontaneously, global aphasia. Cough, gag, has not EOMI, tracks examiner.  PERRL Grunting and incomprehensible sounds only. Is able to lift and hold right upper extremity off bed spontaneously, left arm normal strength  moves bilateral lower extremities and left upper extremity spontaneously    ASSESSMENT/PLAN  [name] is a 89 y.o. male with history of A-fib on  edoxaban  (switched to edoxaban  as Eliquis  interacted with prostate cancer treatment), CHF, left MCA stroke in 2021 status post thrombectomy, diabetes, hypertension, hyperlipidemia, sleep apnea on CPAP, CKD stage IIIb and prostate cancer on Xtandi  and leuprolide  admitted for acute onset right facial droop, right-sided weakness and altered mental status.  He was found to have left MCA M1 occlusion and was taken to interventional radiology for successful mechanical thrombectomy.  He remained intubated after the procedure.  Extubated 6/9.  NIH on Admission 26  Stroke:  left MCA infarct with left M1 occlusion s/p IR with TICI3, etiology likely A-fib on DOAC in the setting of recent cardioversion Code Stroke CT head stable acute cytotoxic edema in left MCA territory ASPECTS 7 CTA head & neck recurrent left MCA M1 occlusion,  left VA moderate stenosis at origin, right VA occlusion Status post IR with TICI3 Follow-up CT Evolving left MCA territory infarct with mild edema and sulcal  effacement. No midline shift. Foci of hyperattenuation in the posterior left temporal lobe suggestive of developing petechial hemorrhage. Hyperdense appearance of the distal left M1 segment and left M2  MRI Evolving acute to early subacute ischemic nonhemorrhagic left MCA infarct. Pan CT showed no advance malignancy  2D Echo EF 45 to 50% LDL 32 HgbA1c 8.7 VTE prophylaxis - Edoxaban  Edoxaban  prior to admission, now resumed Edoxaban  Therapy recommendations: CIR but likely SNF given lack of home support Disposition: Pending  Hx of Stroke/TIA 08/2019 admitted for left BG/CR and caudate infarct.  CTA head and neck left M1 occlusion, right MCA occlusion.  CTP 0/98 cc.  S/p IR with TICI3.  MRI post IR showed left M1 patent.  EF 60 to 65%.  LDL 27, A1c 7.8.  Discharged on aspirin  325 and Lipitor 10.  Recommend discussed with patient cardiologist regarding DOAC.  Atrial fibrillation Was on pradaxa , then changed to eliquis  - ? Due to rectal bleeding ? AC was held during 06/2019 [facility] admission due to rectal bleeding and high-risk of recurrent bleeding.  CHADS-VASC score of 5 at that time, with 7.2% risk of stroke per year, but need to weigh risk of stroke versus risk of recurrent GI bleeding with Eliquis . At the time of discharge, a mutual decision was made with patient, family, and medical team to not restart anticoagulation for fear of recurrence of life-threatening bleeding Stroke in 08/2019 as above, on discharge patient and family reluctant to start DOAC, therefore they were recommended to discuss with his cardiologist regarding DOAC as outpatient. Was put back on eliquis  but then changed to edoxaban  as Eliquis  has interaction with prostate cancer medications. Current home meds including amiodarone  200 mg daily, verapamil  180 mg daily, edoxaban  30  mg daily Had cardiac cath and cardioversion 2 days prior to admission Still in afib RVR with pacing, continue telemetry Continue home amiodarone  and verapamil   Amio drip-->transitioned to PO 6/10 Edoxaban  restarted  Not Watchman device candidate at this time, may consider if significant neuro improvement.    Hypertension Home meds: Verapamil , on hold Stable Long-term BP goal normotensive  Hyperlipidemia Home meds: Atorvastatin  10 mg daily, continue LDL 32, goal < 70 High intensity statin not indicated as LDL below goal Continue statin at discharge  Diabetes type II poorly controlled Home meds:  Farxiga  10 mg daily HgbA1c 8.7, goal < 7.0 CBGs SSI Recommend close follow-up with PCP for better DM control  Dysphagia Patient has post-stroke dysphagia SLP following Failed bedside swallow evaluation MBS passed 6/11 on DYS1 and thin liquids  Other Stroke Risk  Factors Obesity, Body mass index is 31.27 kg/m., BMI >/= 30 associated with increased stroke risk, recommend weight loss, diet and exercise as appropriate  Obstructive sleep apnea, on CPAP at home CHF  Other Active Problems Prostate cancer-plan to resume home Xtandi , per pharmacy History of rectal bleeding CKD 3B, creatinine 2.26--1.82-1.33-1.47-1.51--1.48-1.43-1.52  Hospital day # 7  Patient seen by NP with MD, MD to edit note as needed.  Jonette Nestle DNP, ACNPC-AG  [facility]   I have personally obtained history,examined this patient, reviewed notes, independently viewed imaging studies, participated in medical decision making and plan of care.ROS completed by me personally and pertinent positives fully documented  I have made any additions or clarifications directly to the above note. Agree with note above. I spent a total of 30 minutes dedicated to the care of this patient.    Cassandra Cleveland, MD Neurology  07/05/2023 3:40 PM

## 2023-07-05 NOTE — Progress Notes (Signed)
 Inpatient Rehab Admissions:  Inpatient Rehab Consult received.  Attempted to meet with patient at the bedside for rehabilitation assessment and to discuss goals and expectations of an inpatient rehab admission.  Per chart, pt has speech-language deficits. NSG advised contact pt's daughter [name]. Spoke with Tiffany on the telephone. Discussed average length of stay, insurance authorization requirements and discharge home after completion of CIR. She acknowledged understanding. She informed AC pt would not have 24/7 support after discharge. Family would prefer SNF placement. TOC made aware. AC will sign off.  Signed: Artemus Larsen, MS, CCC-SLP Admissions Coordinator [phone]

## 2023-07-05 NOTE — Progress Notes (Addendum)
 Paged Dr Arora because night nurse and tech at bedside cleaning pt and this nurse entered and saw that urine was deep red/bloody and that had not been the case at all today.  Urine previously was brownish/tea colored and had been throughout the day and now very bloody.  Groin with swelling and abdomen appears distended. Recently placed back on Edoxaban . Dr ordered a u/a to be sent.  I did call daughter [name] to notify her.

## 2023-07-05 NOTE — Plan of Care (Signed)
 Following up on the results UA with positive leuk esterase, rare bacteria.  Will wait for cultures before starting antibiotic. Abdominal film unremarkable Discussed with the on-call neuroradiologist.  Would proceed with CT of the abdomen and pelvis to include the groin because of groin swelling as he is post IR and was started on anticoagulation. Will follow  -- Tona Francis, MD Neurologist [facility]

## 2023-07-06 ENCOUNTER — Inpatient Hospital Stay (HOSPITAL_COMMUNITY)

## 2023-07-06 LAB — GLUCOSE, CAPILLARY
Glucose-Capillary: 137 mg/dL — ABNORMAL HIGH (ref 70–99)
Glucose-Capillary: 152 mg/dL — ABNORMAL HIGH (ref 70–99)
Glucose-Capillary: 160 mg/dL — ABNORMAL HIGH (ref 70–99)
Glucose-Capillary: 163 mg/dL — ABNORMAL HIGH (ref 70–99)
Glucose-Capillary: 180 mg/dL — ABNORMAL HIGH (ref 70–99)
Glucose-Capillary: 199 mg/dL — ABNORMAL HIGH (ref 70–99)
Glucose-Capillary: 204 mg/dL — ABNORMAL HIGH (ref 70–99)

## 2023-07-06 LAB — CBC
HCT: 43.1 % (ref 39.0–52.0)
Hemoglobin: 13.9 g/dL (ref 13.0–17.0)
MCH: 31 pg (ref 26.0–34.0)
MCHC: 32.3 g/dL (ref 30.0–36.0)
MCV: 96 fL (ref 80.0–100.0)
Platelets: 398 10*3/uL (ref 150–400)
RBC: 4.49 MIL/uL (ref 4.22–5.81)
RDW: 15.2 % (ref 11.5–15.5)
WBC: 8.6 10*3/uL (ref 4.0–10.5)
nRBC: 0 % (ref 0.0–0.2)

## 2023-07-06 LAB — BASIC METABOLIC PANEL WITH GFR
Anion gap: 13 (ref 5–15)
BUN: 27 mg/dL — ABNORMAL HIGH (ref 8–23)
CO2: 23 mmol/L (ref 22–32)
Calcium: 9 mg/dL (ref 8.9–10.3)
Chloride: 105 mmol/L (ref 98–111)
Creatinine, Ser: 1.4 mg/dL — ABNORMAL HIGH (ref 0.61–1.24)
GFR, Estimated: 48 mL/min — ABNORMAL LOW (ref >60)
Glucose, Bld: 165 mg/dL — ABNORMAL HIGH (ref 70–99)
Potassium: 4 mmol/L (ref 3.5–5.1)
Sodium: 141 mmol/L (ref 135–145)

## 2023-07-06 MED ORDER — IOHEXOL 350 MG/ML SOLN
75.0000 mL | Freq: Once | INTRAVENOUS | Status: AC | PRN
  Administered 2023-07-06: 75 mL via INTRAVENOUS

## 2023-07-06 MED ORDER — SODIUM CHLORIDE 0.9 % IV SOLN
1.0000 g | Freq: Every day | INTRAVENOUS | Status: AC
  Administered 2023-07-06 – 2023-07-10 (×6): 1 g via INTRAVENOUS
  Filled 2023-07-06 (×6): qty 10

## 2023-07-06 NOTE — Plan of Care (Addendum)
 CT abd and pelvis IMPRESSION: 1. No evidence of retroperitoneal hemorrhage or hematoma. 2. Bilateral groin hematomas, not significantly changed in size since 3 days ago. 3. Minimal presacral ascites, unchanged since 3 days ago. 4. Remote radical prostatectomy without evidence of recurrent mass in the prostate bed. 5. Increasing bladder wall prominence and perivesical stranding concerning for cystitis. 6. Small pleural effusions and overlying lower lobe atelectasis, unchanged. 7. Diffuse bronchial thickening and occasional posterior basilar lower lobe subsegmental branch bronchial impactions. 8. Aortic and branch vessel atherosclerosis. 9. 3.3 cm fusiform infrarenal AAA. 2.1 cm aneurysmal prominence right common iliac artery. Recommend surveillance ultrasound in 3 years. Reference: Journal of Vascular Surgery 67.1 (2018): 2-77. J Am Coll Radiol [phone]. 10. Stable hepatic cysts. 11. Osteopenia and degenerative change. 12. Umbilical fat hernia.   Will start rocephin  for UTI.   Also notified of T wave changes on telemetry when the patient was in CT. Repeated an EKG.  Reviewed with the on-call cardiology fellow-no new changes.  No new orders.  Will follow  -- Tona Francis, MD Neurologist [facility]

## 2023-07-06 NOTE — Progress Notes (Signed)
 1950H - Received patient sitting on the chair. Patient asked to transfer to the bed. Passed BM on chair. Notice of blood streaks on urine output and swelling on both groins and abdominal distention. AM nurse entered while cleaning patient and  said it was only brown colored urine in the morning. She paged on-call Dr. Bonnita Buttner, MD. Received a return call from Dr. Arora.MD. Ordered for urinalysis and xray abdomen. 22:42H - Notified on-call xray abdomen and urinalysis was reported already. Received an order for CT abdomen and pelvis. 00:25H - Patient sent for CT. There was a call from central tele that patient has episode of T wave elevation. 00:55H - Patient back from CT scan. Informed doctor CT was done and T wave elevation was noted by tele. 01:30H - EKG done. Doctor notified. [id number] - Patient started on IV antibiotic.

## 2023-07-06 NOTE — Plan of Care (Signed)
  Problem: Ischemic Stroke/TIA Tissue Perfusion: Goal: Complications of ischemic stroke/TIA will be minimized Outcome: Progressing   Problem: Education: Goal: Knowledge of disease or condition will improve Outcome: Progressing Goal: Knowledge of secondary prevention will improve (MUST DOCUMENT ALL) Outcome: Progressing Goal: Knowledge of patient specific risk factors will improve (DELETE if not current risk factor) Outcome: Progressing   Problem: Coping: Goal: Will verbalize positive feelings about self Outcome: Progressing Goal: Will identify appropriate support needs Outcome: Progressing

## 2023-07-06 NOTE — Progress Notes (Signed)
 00:25H  Patient off to CT on bed. Accompanied by transport service. On room air. Alert and no complains of pain.

## 2023-07-06 NOTE — Progress Notes (Addendum)
 STROKE TEAM PROGRESS NOTE    SIGNIFICANT HOSPITAL EVENTS 6/7-patient admitted, taken to interventional radiology with successful revascularization of left MCA M1 segment 6/10: MRI brain with evolving acute to early subacute ischemic left MCA infarct 6/11: Passed MBS. Transferred out of ICU.   INTERIM HISTORY/SUBJECTIVE Patient seen and evaluated this morning, overnight noted to have hematuria. Urinalysis with large leukocytes. CTAP with no evidence of hemorrhage, but there is increase bladder wall prominence concerning for cystitis. He was started on Ceftriaxone .  No family at the bedside.    He is awake alert.  No new neurological events overnight Labs and vitals are stable.  Waiting for bed available on CIR  CBC    Component Value Date/Time   WBC 8.6 07/06/2023 0713   RBC 4.49 07/06/2023 0713   HGB 13.9 07/06/2023 0713   HGB 15.2 04/29/2023 1259   HGB 14.6 06/11/2019 0953   HCT 43.1 07/06/2023 0713   HCT 46.0 06/11/2019 0953   PLT 398 07/06/2023 0713   PLT 189 04/29/2023 1259   PLT 154 06/11/2019 0953   MCV 96.0 07/06/2023 0713   MCV 94 06/11/2019 0953   MCV 92 10/13/2013 0415   MCH 31.0 07/06/2023 0713   MCHC 32.3 07/06/2023 0713   RDW 15.2 07/06/2023 0713   RDW 13.3 06/11/2019 0953   RDW 15.3 (H) 10/13/2013 0415   LYMPHSABS 0.3 (L) 06/29/2023 0458   LYMPHSABS 2.4 10/13/2013 0415   MONOABS 1.0 06/29/2023 0458   MONOABS 0.8 10/13/2013 0415   EOSABS 0.1 06/29/2023 0458   EOSABS 0.2 10/13/2013 0415   BASOSABS 0.0 06/29/2023 0458   BASOSABS 0.0 10/13/2013 0415    BMET    Component Value Date/Time   NA 141 07/06/2023 0713   NA 144 10/11/2021 0936   NA 138 10/13/2013 0415   K 4.0 07/06/2023 0713   K 4.0 10/13/2013 0415   CL 105 07/06/2023 0713   CL 106 10/13/2013 0415   CO2 23 07/06/2023 0713   CO2 27 10/13/2013 0415   GLUCOSE 165 (H) 07/06/2023 0713   GLUCOSE 97 10/13/2013 0415   BUN 27 (H) 07/06/2023 0713   BUN 46 (H) 10/11/2021 0936   BUN 22 (H) 10/13/2013  0415   CREATININE 1.40 (H) 07/06/2023 0713   CREATININE 2.17 (H) 04/29/2023 1259   CREATININE 1.65 (H) 10/13/2013 0415   CALCIUM  9.0 07/06/2023 0713   CALCIUM  8.2 (L) 10/13/2013 0415   EGFR 22 (L) 10/11/2021 0936   GFRNONAA 48 (L) 07/06/2023 0713   GFRNONAA 29 (L) 04/29/2023 1259   GFRNONAA 39 (L) 10/13/2013 0415    IMAGING past 24 hours CT ABDOMEN PELVIS W CONTRAST Result Date: 07/06/2023 CLINICAL DATA:  Epic notes indicate patient had a percutaneous arterial thrombectomy/infusion 06/28/2023. There is swelling in the groin and reason to suspect retroperitoneal hemorrhage as well. The patient was noted with bilateral groin hematomas on a scan from 3 days ago, which was done to assess for metastatic disease from his prostate cancer. EXAM: CT ABDOMEN AND PELVIS WITH CONTRAST TECHNIQUE: Multidetector CT imaging of the abdomen and pelvis was performed using the standard protocol following bolus administration of intravenous contrast. RADIATION DOSE REDUCTION: This exam was performed according to the departmental dose-optimization program which includes automated exposure control, adjustment of the mA and/or kV according to patient size and/or use of iterative reconstruction technique. CONTRAST:  75mL OMNIPAQUE  IOHEXOL  350 MG/ML SOLN COMPARISON:  CT scan chest, abdomen and pelvis with IV contrast 07/03/2023, CT abdomen and pelvis with no contrast  07/23/2022, and CT abdomen and pelvis with IV contrast 10/16/2021. FINDINGS: Lower chest: Again noted moderate panchamber cardiomegaly with biatrial chamber predominance, prominent central pulmonary veins. There is spray artifact from a right ventricular leadless pacemaker. No pericardial effusion. Small pleural effusions and overlying lower lobe atelectasis are unchanged. There is diffuse bronchial thickening and occasional posterior basilar lower lobe subsegmental branch bronchial impactions. Hepatobiliary: Once again, an exophytic round lesion in the outer  periphery of hepatic segment 5/6 measures 4.6 x 4 cm on 3:29, 75 Hounsfield units but is not significantly changed dating back to 2021 and is probably a complex cyst. Other scattered hepatic cysts are similarly unchanged, largest is anteriorly in segment 3 again measuring 3.6 cm and 13 Hounsfield units. Gallbladder and bile ducts are unremarkable apart from contrast in the gallbladder which was likely vicariously excreted from the contrast administered 3 days ago. Pancreas: Partially atrophic and otherwise unremarkable. Spleen: No abnormality. Adrenals/Urinary Tract: No adrenal mass. Both kidneys again show patchy cortical scarring and multiple nonobstructive caliceal stones. There is no obstructing stone or hydronephrosis. There is no renal mass enhancement. The bladder is contracted but even so appears increasingly thickened and with increased perivesical stranding concerning for cystitis. Stomach/Bowel: Unremarkable contracted stomach. Normal caliber small bowel. An appendix is not seen in this patient. There is contrast in the colon. Diverticulosis of left colon. There is no evidence of wall thickening or dilatation. Vascular/Lymphatic: Moderate to heavy aortoiliac atherosclerosis. 3.3 cm fusiform infrarenal AAA. Asymmetric aneurysmal prominence right common iliac artery 2.1 cm. No appreciable adenopathy. Reproductive: Remote radical prostatectomy. No recurrent mass is seen in the prostate bed. Both testicles are in the scrotal sac. Numerous pelvic phleboliths. Other: There continues to be minimal presacral ascites, unchanged since 3 days ago. There is mild body wall edema in the upper thighs laterally. There is a small umbilical fat hernia.  No incarcerated hernia. There are subcutaneous bilateral groin hematomas, also seen previously. The hematoma on the right measures 6.6 x 2.3 cm on 3:95. The hematoma on the left measures 6.9 by 3.3 cm on 3:92. Both are not significantly changed in size since 3 days ago.  Within the abdomen and pelvis there is no free hemorrhage or hematoma, no free air or focal inflammatory process apart from the perivesical stranding. Musculoskeletal: Degenerative change lumbar spine. Bridging osteophytes both anterior SI joints. Osteopenia. IMPRESSION: 1. No evidence of retroperitoneal hemorrhage or hematoma. 2. Bilateral groin hematomas, not significantly changed in size since 3 days ago. 3. Minimal presacral ascites, unchanged since 3 days ago. 4. Remote radical prostatectomy without evidence of recurrent mass in the prostate bed. 5. Increasing bladder wall prominence and perivesical stranding concerning for cystitis. 6. Small pleural effusions and overlying lower lobe atelectasis, unchanged. 7. Diffuse bronchial thickening and occasional posterior basilar lower lobe subsegmental branch bronchial impactions. 8. Aortic and branch vessel atherosclerosis. 9. 3.3 cm fusiform infrarenal AAA. 2.1 cm aneurysmal prominence right common iliac artery. Recommend surveillance ultrasound in 3 years. Reference: Journal of Vascular Surgery 67.1 (2018): 2-77. J Am Coll Radiol [phone]. 10. Stable hepatic cysts. 11. Osteopenia and degenerative change. 12. Umbilical fat hernia. Aortic Atherosclerosis (ICD10-I70.0). Electronically Signed   By: Denman Fischer M.D.   On: 07/06/2023 01:26   DG Abd 1 View Result Date: 07/05/2023 EXAM: 1 VIEW XRAY OF THE ABDOMEN SUPINE 07/05/2023 08:44:00 PM COMPARISON: CT abdomen/pelvis dated 09/27/2023. CLINICAL HISTORY: Samuel Crock hematuria. FINDINGS: BOWEL: Nonobstructive bowel gas pattern. Residual contrast in the colon/rectum with suspected sigmoid diverticulosis. PERITONEUM AND SOFT TISSUES:  No abnormal calcifications. BONES: No acute osseous abnormality. Status post prostatectomy. IMPRESSION: 1. No acute findings. 2. Status post prostatectomy. Electronically signed by: Zadie Herter MD 07/05/2023 08:56 PM EDT RP Workstation: [id number]      Vitals:   07/05/23 2355  07/06/23 0339 07/06/23 0729 07/06/23 1145  BP: (!) 147/99 (!) 153/97 138/87 114/73  Pulse: 89 (!) 106 72 (!) 109  Resp: 18 19 16 18   Temp: 98.9 F (37.2 C) 98.9 F (37.2 C) 98.4 F (36.9 C) 99 F (37.2 C)  TempSrc: Oral Oral Oral Oral  SpO2: 100% 97% 92% 99%  Weight:      Height:        PHYSICAL EXAM General:Elderly patient in no acute distress Psych:  Mood and affect appropriate for situation.  Calm and cooperative CV: Continued A-fib on the monitor Respiratory: Regular, unlabored breathing, room air  NEURO  Eyes open spontaneously, global aphasia. Follow commands inconsistently but able to mimic examiner Cough, gag, present  EOMI, tracks examiner.  PERRL Grunting and incomprehensible sounds only. Is able to lift and hold right upper extremity off bed spontaneously, left arm normal strength  moves bilateral lower extremities and left upper extremity spontaneously    ASSESSMENT/PLAN  Mr. ANTIONE OBAR is a 89 y.o. male with history of A-fib on edoxaban  (switched to edoxaban  as Eliquis  interacted with prostate cancer treatment), CHF, left MCA stroke in 2021 status post thrombectomy, diabetes, hypertension, hyperlipidemia, sleep apnea on CPAP, CKD stage IIIb and prostate cancer on Xtandi  and leuprolide  admitted for acute onset right facial droop, right-sided weakness and altered mental status.  He was found to have left MCA M1 occlusion and was taken to interventional radiology for successful mechanical thrombectomy.  He remained intubated after the procedure.  Extubated 6/9.  NIH on Admission 26  Stroke:  left MCA infarct with left M1 occlusion s/p IR with TICI3, etiology likely A-fib on DOAC in the setting of recent cardioversion Code Stroke CT head stable acute cytotoxic edema in left MCA territory ASPECTS 7 CTA head & neck recurrent left MCA M1 occlusion, left VA moderate stenosis at origin, right VA occlusion Status post IR with TICI3 Follow-up CT Evolving left MCA  territory infarct with mild edema and sulcal  effacement. No midline shift. Foci of hyperattenuation in the posterior left temporal lobe suggestive of developing petechial hemorrhage. Hyperdense appearance of the distal left M1 segment and left M2  MRI Evolving acute to early subacute ischemic nonhemorrhagic left MCA infarct. Pan CT showed no advance malignancy  2D Echo EF 45 to 50% LDL 32 HgbA1c 8.7 VTE prophylaxis - Edoxaban  Edoxaban  prior to admission, now resumed Edoxaban  Therapy recommendations: CIR but likely SNF given lack of home support Disposition: Pending  Hx of Stroke/TIA 08/2019 admitted for left BG/CR and caudate infarct.  CTA head and neck left M1 occlusion, right MCA occlusion.  CTP 0/98 cc.  S/p IR with TICI3.  MRI post IR showed left M1 patent.  EF 60 to 65%.  LDL 27, A1c 7.8.  Discharged on aspirin  325 and Lipitor 10.  Recommend discussed with patient cardiologist regarding DOAC.  Atrial fibrillation Was on pradaxa , then changed to eliquis  - ? Due to rectal bleeding ? AC was held during 06/2019 [facility] admission due to rectal bleeding and high-risk of recurrent bleeding.  CHADS-VASC score of 5 at that time, with 7.2% risk of stroke per year, but need to weigh risk of stroke versus risk of recurrent GI bleeding with Eliquis . At the time of  discharge, a mutual decision was made with patient, family, and medical team to not restart anticoagulation for fear of recurrence of life-threatening bleeding Stroke in 08/2019 as above, on discharge patient and family reluctant to start DOAC, therefore they were recommended to discuss with his cardiologist regarding DOAC as outpatient. Was put back on eliquis  but then changed to edoxaban  as Eliquis  has interaction with prostate cancer medications. Current home meds including amiodarone  200 mg daily, verapamil  180 mg daily, edoxaban  30 mg daily Had cardiac cath and cardioversion 2 days prior to admission Still in afib RVR with pacing, continue  telemetry Continue home amiodarone  and verapamil   Amio drip-->transitioned to PO 6/10 Edoxaban  restarted  Not Watchman device candidate at this time, may consider if significant neuro improvement.    Hypertension Home meds: Verapamil , on hold Stable Long-term BP goal normotensive  Hyperlipidemia Home meds: Atorvastatin  10 mg daily, continue LDL 32, goal < 70 High intensity statin not indicated as LDL below goal Continue statin at discharge  Diabetes type II poorly controlled Home meds:  Farxiga  10 mg daily HgbA1c 8.7, goal < 7.0 CBGs SSI Recommend close follow-up with PCP for better DM control  Dysphagia Patient has post-stroke dysphagia SLP following Failed bedside swallow evaluation MBS passed 6/11 on DYS1 and thin liquids  Other Stroke Risk Factors Obesity, Body mass index is 31.27 kg/m., BMI >/= 30 associated with increased stroke risk, recommend weight loss, diet and exercise as appropriate  Obstructive sleep apnea, on CPAP at home CHF  Other Active Problems Prostate cancer-plan to resume home Xtandi , per pharmacy History of rectal bleeding CKD 3B, creatinine 2.26--1.82-1.33-1.47-1.51--1.48-1.43-1.52  Hospital day # 8   I personally spent a total of 30 minutes in the care of the patient today including preparing to see the patient, getting/reviewing separately obtained history, performing a medically appropriate exam/evaluation, and documenting clinical information in the EHR.   Cassandra Cleveland, MD Neurology  07/06/2023 11:56 AM

## 2023-07-07 ENCOUNTER — Other Ambulatory Visit: Payer: Self-pay

## 2023-07-07 DIAGNOSIS — I4891 Unspecified atrial fibrillation: Secondary | ICD-10-CM | POA: Diagnosis not present

## 2023-07-07 DIAGNOSIS — I69391 Dysphagia following cerebral infarction: Secondary | ICD-10-CM | POA: Diagnosis not present

## 2023-07-07 DIAGNOSIS — N3000 Acute cystitis without hematuria: Secondary | ICD-10-CM

## 2023-07-07 DIAGNOSIS — I63512 Cerebral infarction due to unspecified occlusion or stenosis of left middle cerebral artery: Secondary | ICD-10-CM | POA: Diagnosis not present

## 2023-07-07 DIAGNOSIS — E1165 Type 2 diabetes mellitus with hyperglycemia: Secondary | ICD-10-CM | POA: Diagnosis not present

## 2023-07-07 LAB — GLUCOSE, CAPILLARY
Glucose-Capillary: 145 mg/dL — ABNORMAL HIGH (ref 70–99)
Glucose-Capillary: 148 mg/dL — ABNORMAL HIGH (ref 70–99)
Glucose-Capillary: 181 mg/dL — ABNORMAL HIGH (ref 70–99)
Glucose-Capillary: 207 mg/dL — ABNORMAL HIGH (ref 70–99)
Glucose-Capillary: 196 mg/dL — ABNORMAL HIGH (ref 70–99)
Glucose-Capillary: 212 mg/dL — ABNORMAL HIGH (ref 70–99)

## 2023-07-07 NOTE — Progress Notes (Signed)
 Speech Language Pathology Treatment: Cognitive-Linguistic  Patient Details Name: [name] MRN: [id number] DOB: 15-Dec-1933 Today's Date: 07/07/2023 Time: 4098-1191 SLP Time Calculation (min) (ACUTE ONLY): 44 min  Assessment / Plan / Recommendation Clinical Impression  Great session with pt today, he had many more correct spontaneous utterances today including I know what I can do etc. The majority of his attempts to communicate are characterized by jargon/paraphasias. When asked a confrontational question pt starts to respond, but after a pause is not able to initiate a response. When engaged in a task with successful joint attention with SLP (cards) pt able to attend, initiate picking up cards, tries to talk about his cards and has some clear social language, particularly stimulated by parallel talk and recasting. Pt able to count from 1-4 x3 with a visual cue of the cards. Also repeated his name with more success. Though he does appear to have severely impaired language comprehension he is able to comprehend context and follow gestures and uses prosody to engage well.   HPI HPI: [name] is a 89 y.o. male with admitted for acute onset right facial droop, right-sided weakness and altered mental status.  He was found to have left MCA M1 occlusion and was taken to interventional radiology for successful mechanical thrombectomy.Intubated 6/7-6/9. history of A-fib on edoxaban  (switched to edoxaban  as Eliquis  interacted with prostate cancer treatment), CHF, left MCA stroke in 2021 status post thrombectomy, diabetes, hypertension, hyperlipidemia, sleep apnea on CPAP, CKD stage IIIb and prostate cancer on Xtandi  and leuprolide       SLP Plan  Continue with current plan of care          Recommendations                                 Continue with current plan of care     Javone Ybanez, Hardin Leys  07/07/2023, 11:52 AM

## 2023-07-07 NOTE — Plan of Care (Signed)
  Problem: Education: Goal: Knowledge of disease or condition will improve Outcome: Progressing Goal: Knowledge of secondary prevention will improve (MUST DOCUMENT ALL) Outcome: Progressing Goal: Knowledge of patient specific risk factors will improve (DELETE if not current risk factor) Outcome: Progressing   Problem: Ischemic Stroke/TIA Tissue Perfusion: Goal: Complications of ischemic stroke/TIA will be minimized Outcome: Progressing   Problem: Coping: Goal: Will verbalize positive feelings about self Outcome: Progressing Goal: Will identify appropriate support needs Outcome: Progressing   Problem: Health Behavior/Discharge Planning: Goal: Ability to manage health-related needs will improve Outcome: Progressing Goal: Goals will be collaboratively established with patient/family Outcome: Progressing   Problem: Self-Care: Goal: Ability to participate in self-care as condition permits will improve Outcome: Progressing Goal: Verbalization of feelings and concerns over difficulty with self-care will improve Outcome: Progressing Goal: Ability to communicate needs accurately will improve Outcome: Progressing   Problem: Nutrition: Goal: Risk of aspiration will decrease Outcome: Progressing Goal: Dietary intake will improve Outcome: Progressing   Problem: Activity: Goal: Ability to tolerate increased activity will improve Outcome: Progressing   Problem: Respiratory: Goal: Ability to maintain a clear airway and adequate ventilation will improve Outcome: Progressing   Problem: Role Relationship: Goal: Method of communication will improve Outcome: Progressing   Problem: Education: Goal: Knowledge of General Education information will improve Description: Including pain rating scale, medication(s)/side effects and non-pharmacologic comfort measures Outcome: Progressing   Problem: Health Behavior/Discharge Planning: Goal: Ability to manage health-related needs will  improve Outcome: Progressing   Problem: Clinical Measurements: Goal: Ability to maintain clinical measurements within normal limits will improve Outcome: Progressing Goal: Will remain free from infection Outcome: Progressing Goal: Diagnostic test results will improve Outcome: Progressing Goal: Respiratory complications will improve Outcome: Progressing Goal: Cardiovascular complication will be avoided Outcome: Progressing   Problem: Activity: Goal: Risk for activity intolerance will decrease Outcome: Progressing   Problem: Nutrition: Goal: Adequate nutrition will be maintained Outcome: Progressing   Problem: Coping: Goal: Level of anxiety will decrease Outcome: Progressing   Problem: Elimination: Goal: Will not experience complications related to bowel motility Outcome: Progressing Goal: Will not experience complications related to urinary retention Outcome: Progressing   Problem: Pain Managment: Goal: General experience of comfort will improve and/or be controlled Outcome: Progressing   Problem: Safety: Goal: Ability to remain free from injury will improve Outcome: Progressing   Problem: Skin Integrity: Goal: Risk for impaired skin integrity will decrease Outcome: Progressing   Problem: Education: Goal: Understanding of CV disease, CV risk reduction, and recovery process will improve Outcome: Progressing Goal: Individualized Educational Video(s) Outcome: Progressing   Problem: Activity: Goal: Ability to return to baseline activity level will improve Outcome: Progressing   Problem: Cardiovascular: Goal: Ability to achieve and maintain adequate cardiovascular perfusion will improve Outcome: Progressing Goal: Vascular access site(s) Level 0-1 will be maintained Outcome: Progressing   Problem: Health Behavior/Discharge Planning: Goal: Ability to safely manage health-related needs after discharge will improve Outcome: Progressing

## 2023-07-07 NOTE — Progress Notes (Addendum)
 STROKE TEAM PROGRESS NOTE    SIGNIFICANT HOSPITAL EVENTS 6/7-patient admitted, taken to interventional radiology with successful revascularization of left MCA M1 segment 6/10: MRI brain with evolving acute to early subacute ischemic left MCA infarct 6/11: Passed MBS. Transferred out of ICU.   INTERIM HISTORY/SUBJECTIVE  No family at bedside.  Stable neuroexam, no acute events overnight. Started on ceftriaxone  for UTI 6/15  SLP at bedside.  Patient able to say his first name, slowly..   Pending SNF bed availability for discharge, medically stable.   CBC    Component Value Date/Time   WBC 8.6 07/06/2023 0713   RBC 4.49 07/06/2023 0713   HGB 13.9 07/06/2023 0713   HGB 15.2 04/29/2023 1259   HGB 14.6 06/11/2019 0953   HCT 43.1 07/06/2023 0713   HCT 46.0 06/11/2019 0953   PLT 398 07/06/2023 0713   PLT 189 04/29/2023 1259   PLT 154 06/11/2019 0953   MCV 96.0 07/06/2023 0713   MCV 94 06/11/2019 0953   MCV 92 10/13/2013 0415   MCH 31.0 07/06/2023 0713   MCHC 32.3 07/06/2023 0713   RDW 15.2 07/06/2023 0713   RDW 13.3 06/11/2019 0953   RDW 15.3 (H) 10/13/2013 0415   LYMPHSABS 0.3 (L) 06/29/2023 0458   LYMPHSABS 2.4 10/13/2013 0415   MONOABS 1.0 06/29/2023 0458   MONOABS 0.8 10/13/2013 0415   EOSABS 0.1 06/29/2023 0458   EOSABS 0.2 10/13/2013 0415   BASOSABS 0.0 06/29/2023 0458   BASOSABS 0.0 10/13/2013 0415    BMET    Component Value Date/Time   NA 141 07/06/2023 0713   NA 144 10/11/2021 0936   NA 138 10/13/2013 0415   K 4.0 07/06/2023 0713   K 4.0 10/13/2013 0415   CL 105 07/06/2023 0713   CL 106 10/13/2013 0415   CO2 23 07/06/2023 0713   CO2 27 10/13/2013 0415   GLUCOSE 165 (H) 07/06/2023 0713   GLUCOSE 97 10/13/2013 0415   BUN 27 (H) 07/06/2023 0713   BUN 46 (H) 10/11/2021 0936   BUN 22 (H) 10/13/2013 0415   CREATININE 1.40 (H) 07/06/2023 0713   CREATININE 2.17 (H) 04/29/2023 1259   CREATININE 1.65 (H) 10/13/2013 0415   CALCIUM  9.0 07/06/2023 0713    CALCIUM  8.2 (L) 10/13/2013 0415   EGFR 22 (L) 10/11/2021 0936   GFRNONAA 48 (L) 07/06/2023 0713   GFRNONAA 29 (L) 04/29/2023 1259   GFRNONAA 39 (L) 10/13/2013 0415    IMAGING past 24 hours No results found.    Vitals:   07/06/23 2007 07/07/23 0100 07/07/23 0439 07/07/23 0736  BP: 119/76 119/84 127/83 131/84  Pulse: 99 94 88 99  Resp: 18 16 14 18   Temp: 97.8 F (36.6 C) 99 F (37.2 C) 97.7 F (36.5 C) 97.6 F (36.4 C)  TempSrc: Oral Oral Oral Oral  SpO2: 97% 96% 97% 100%  Weight:      Height:        PHYSICAL EXAM General:Elderly patient in no acute distress Psych:  Mood and affect appropriate for situation.  Calm and cooperative CV: Continued A-fib on the monitor Respiratory: Regular, unlabored breathing, room air  NEURO  Eyes open spontaneously, global aphasia. Able to say his first name and repeat it while working with SLP.  Cough, gag, has not EOMI, tracks examiner.  PERRL Antigravity strength, spontaneous movement of all 4 RUE: can lift and hold off bed spontaneously, weaker than LUE   ASSESSMENT/PLAN  [name] is a 89 y.o. male with history  of A-fib on edoxaban  (switched to edoxaban  as Eliquis  interacted with prostate cancer treatment), CHF, left MCA stroke in 2021 status post thrombectomy, diabetes, hypertension, hyperlipidemia, sleep apnea on CPAP, CKD stage IIIb and prostate cancer on Xtandi  and leuprolide  admitted for acute onset right facial droop, right-sided weakness and altered mental status.  He was found to have left MCA M1 occlusion and was taken to interventional radiology for successful mechanical thrombectomy.  He remained intubated after the procedure.  Extubated 6/9.  NIH on Admission 26  Stroke:  left MCA infarct with left M1 occlusion s/p IR with TICI3, etiology likely A-fib on DOAC in the setting of recent cardioversion Code Stroke CT head stable acute cytotoxic edema in left MCA territory ASPECTS 7 CTA head & neck recurrent left  MCA M1 occlusion, left VA moderate stenosis at origin, right VA occlusion Status post IR with TICI3 Follow-up CT Evolving left MCA territory infarct with mild edema and sulcal  effacement. No midline shift. Foci of hyperattenuation in the posterior left temporal lobe suggestive of developing petechial hemorrhage. Hyperdense appearance of the distal left M1 segment and left M2  MRI Evolving acute to early subacute ischemic nonhemorrhagic left MCA infarct. Pan CT showed no advance malignancy  2D Echo EF 45 to 50% LDL 32 HgbA1c 8.7 VTE prophylaxis - Edoxaban  Edoxaban  prior to admission, continue resumed Edoxaban  Therapy recommendations: CIR but changed to SNF given lack of home support Disposition: SNF pending family choice  Hx of Stroke/TIA 08/2019 admitted for left BG/CR and caudate infarct.  CTA head and neck left M1 occlusion, right MCA occlusion.  CTP 0/98 cc.  S/p IR with TICI3.  MRI post IR showed left M1 patent.  EF 60 to 65%.  LDL 27, A1c 7.8.  Discharged on aspirin  325 and Lipitor 10.  Recommend discussed with patient cardiologist regarding DOAC.  Atrial fibrillation Was on pradaxa , then changed to eliquis  - ? Due to rectal bleeding ? AC was held during 06/2019 [facility] admission due to rectal bleeding and high-risk of recurrent bleeding.  CHADS-VASC score of 5 at that time, with 7.2% risk of stroke per year, but need to weigh risk of stroke versus risk of recurrent GI bleeding with Eliquis . At the time of discharge, a mutual decision was made with patient, family, and medical team to not restart anticoagulation for fear of recurrence of life-threatening bleeding Stroke in 08/2019 as above, on discharge patient and family reluctant to start DOAC, therefore they were recommended to discuss with his cardiologist regarding DOAC as outpatient. Was put back on eliquis  but then changed to edoxaban  as Eliquis  has interaction with prostate cancer medications. Current home meds including amiodarone  200 mg  daily, verapamil  180 mg daily, edoxaban  30 mg daily Had cardiac cath and cardioversion 2 days prior to admission Still in afib RVR with pacing, continue telemetry Continue home amiodarone  and verapamil   Amio drip-->transitioned to PO 6/10 Continue Edoxaban   Not Watchman device candidate at this time, may consider if significant neuro improvement.    Hypertension Home meds: Verapamil , on hold Stable Long-term BP goal normotensive  Hyperlipidemia Home meds: Atorvastatin  10 mg daily, continue LDL 32, goal < 70 High intensity statin not indicated as LDL below goal Continue statin at discharge  Diabetes type II poorly controlled Home meds:  Farxiga  10 mg daily HgbA1c 8.7, goal < 7.0 CBGs SSI Recommend close follow-up with PCP for better DM control  Dysphagia Patient has post-stroke dysphagia SLP following Failed bedside swallow evaluation MBS passed 6/11 on DYS1  and thin liquids  Other Stroke Risk Factors Obesity, Body mass index is 31.27 kg/m., BMI >/= 30 associated with increased stroke risk, recommend weight loss, diet and exercise as appropriate  Obstructive sleep apnea, on CPAP at home CHF  Other Active Problems Acute Cystitis Urinalysis with large leukocytes  CTAP with no evidence of hemorrhage, but there is increase bladder wall prominence concerning for cystitis.  Started on Ceftriaxone  6/15 Prostate cancer-plan to resume home Xtandi , per pharmacy History of rectal bleeding CKD 3B, creatinine 2.26--1.82-1.33-1.47-1.51--1.48-1.43-1.52  Hospital day # 9  Patient seen by NP with MD, MD to edit note as needed.  Audrene Lease, DNP, AGACNP-BC [facility] Please use AMION for contact information & EPIC for messaging.  07/07/2023 8:20 AM   I have personally obtained history,examined this patient, reviewed notes, independently viewed imaging studies, participated in medical decision making and plan of care.ROS completed by me personally and pertinent  positives fully documented  I have made any additions or clarifications directly to the above note. Agree with note above.  Patient with A-fib has been restarted back on edoxaban .  Medically stable to be transferred to rehabilitation or skilled nursing facility when bed available.  Greater than 50% time during this 35-minute visit was spent in counseling and coordination of care about his embolic stroke and discussion with patient care team and answering questions  Ardella Beaver, MD Medical Director [facility] Pager: [phone] 07/07/2023 3:30 PM

## 2023-07-07 NOTE — Progress Notes (Signed)
 Physical Therapy Treatment Patient Details Name: [name] MRN: [id number] DOB: April 07, 1933 Today's Date: 07/07/2023   History of Present Illness 89 y/o male presented to [facility] ED on 06/28/23 with facial droop and R sided weakness. NIH 25 and imaging showed left MCA infarct with M1 occlusion. Transferred to [facility] and s/p INR L CFA approach. EEG suggestive of severe diffuse encephalopathy likely related to dedation.  Intubated 6/7 -6/9. Recent admission at [facility] with  monomorphic V. Tach s/p cardioversion. PMH: permanent Afib, sick sinus syndrome s/p PPM, HFpEF, CKD stage IIIb, HTN, T2DM, stage IVb metastatic prostate cancer, DVT    PT Comments  Pt making steady progress towards his physical therapy goals. Able to follow verbal commands for motor tasks. Assisted onto bedside commode and then able to ambulate limited distance from [facility] to chair with RW and min-modA (+2 safety). R sided strength improving. Recommend post acute rehab to address deficits, maximize functional mobility and decrease caregiver burden. Pt independent and working prior to hospitalization.    If plan is discharge home, recommend the following: Two people to help with walking and/or transfers;Assistance with cooking/housework;Assist for transportation;Supervision due to cognitive status;Help with stairs or ramp for entrance   Can travel by private vehicle     No  Equipment Recommendations  Wheelchair cushion (measurements PT);Wheelchair (measurements PT);BSC/3in1;Rolling walker (2 wheels)    Recommendations for Other Services       Precautions / Restrictions Precautions Precautions: Fall Recall of Precautions/Restrictions: Impaired Restrictions Weight Bearing Restrictions Per Provider Order: No     Mobility  Bed Mobility Overal bed mobility: Needs Assistance Bed Mobility: Supine to Sit     Supine to sit: Supervision          Transfers Overall transfer level: Needs assistance Equipment used: Rolling walker  (2 wheels) Transfers: Sit to/from Stand, Bed to chair/wheelchair/BSC Sit to Stand: Mod assist Stand pivot transfers: Min assist         General transfer comment: ModA to stand up from edge of bed and BSC. MinA for steadying assist when taking steps    Ambulation/Gait                   Stairs             Wheelchair Mobility     Tilt Bed    Modified Rankin (Stroke Patients Only)       Balance Overall balance assessment: Needs assistance Sitting-balance support: Feet supported Sitting balance-Leahy Scale: Good     Standing balance support: Bilateral upper extremity supported Standing balance-Leahy Scale: Poor                              Communication Communication Communication: Impaired Factors Affecting Communication: Difficulty expressing self;Reduced clarity of speech  Cognition Arousal: Alert Behavior During Therapy: WFL for tasks assessed/performed   PT - Cognitive impairments: Difficult to assess Difficult to assess due to: Impaired communication                     PT - Cognition Comments: Impaired expressive and receptive communication. Good command following Following commands: Impaired Following commands impaired: Follows one step commands with increased time, Only follows one step commands consistently    Cueing Cueing Techniques: Verbal cues, Tactile cues, Gestural cues  Exercises      General Comments        Pertinent Vitals/Pain Pain Assessment Pain Assessment: Faces Faces Pain Scale: No hurt  Home Living                          Prior Function            PT Goals (current goals can now be found in the care plan section) Acute Rehab PT Goals Patient Stated Goal: unable to state Potential to Achieve Goals: Good Progress towards PT goals: Progressing toward goals    Frequency    Min 3X/week      PT Plan      Co-evaluation              AM-PAC PT 6 Clicks Mobility    Outcome Measure  Help needed turning from your back to your side while in a flat bed without using bedrails?: A Little Help needed moving from lying on your back to sitting on the side of a flat bed without using bedrails?: A Little Help needed moving to and from a bed to a chair (including a wheelchair)?: A Little Help needed standing up from a chair using your arms (e.g., wheelchair or bedside chair)?: A Lot Help needed to walk in hospital room?: A Lot Help needed climbing 3-5 steps with a railing? : Total 6 Click Score: 14    End of Session Equipment Utilized During Treatment: Gait belt Activity Tolerance: Patient tolerated treatment well Patient left: in chair;with call bell/phone within reach;with chair alarm set Nurse Communication: Mobility status PT Visit Diagnosis: Unsteadiness on feet (R26.81);Muscle weakness (generalized) (M62.81);Difficulty in walking, not elsewhere classified (R26.2);Hemiplegia and hemiparesis Hemiplegia - Right/Left: Right Hemiplegia - dominant/non-dominant: Dominant Hemiplegia - caused by: Cerebral infarction     Time: 1610-9604 PT Time Calculation (min) (ACUTE ONLY): 28 min  Charges:    $Therapeutic Activity: 23-37 mins PT General Charges $$ ACUTE PT VISIT: 1 Visit                     Verdia Glad, PT, DPT Acute Rehabilitation Services Office [phone]    Claria Crofts 07/07/2023, 4:16 PM

## 2023-07-07 NOTE — Plan of Care (Signed)
 Problem: Education: Goal: Knowledge of disease or condition will improve 07/07/2023 0556 by Brant Caldron, RN Outcome: Progressing 07/07/2023 0422 by Brant Caldron, RN Outcome: Progressing Goal: Knowledge of secondary prevention will improve (MUST DOCUMENT ALL) 07/07/2023 0556 by Brant Caldron, RN Outcome: Progressing 07/07/2023 0422 by Brant Caldron, RN Outcome: Progressing Goal: Knowledge of patient specific risk factors will improve (DELETE if not current risk factor) 07/07/2023 0556 by Brant Caldron, RN Outcome: Progressing 07/07/2023 0422 by Brant Caldron, RN Outcome: Progressing   Problem: Ischemic Stroke/TIA Tissue Perfusion: Goal: Complications of ischemic stroke/TIA will be minimized 07/07/2023 0556 by Brant Caldron, RN Outcome: Progressing 07/07/2023 0422 by Brant Caldron, RN Outcome: Progressing   Problem: Coping: Goal: Will verbalize positive feelings about self 07/07/2023 0556 by Brant Caldron, RN Outcome: Progressing 07/07/2023 0422 by Brant Caldron, RN Outcome: Progressing Goal: Will identify appropriate support needs 07/07/2023 0556 by Brant Caldron, RN Outcome: Progressing 07/07/2023 0422 by Brant Caldron, RN Outcome: Progressing   Problem: Health Behavior/Discharge Planning: Goal: Ability to manage health-related needs will improve 07/07/2023 0556 by Brant Caldron, RN Outcome: Progressing 07/07/2023 0422 by Brant Caldron, RN Outcome: Progressing Goal: Goals will be collaboratively established with patient/family 07/07/2023 0556 by Brant Caldron, RN Outcome: Progressing 07/07/2023 0422 by Brant Caldron, RN Outcome: Progressing   Problem: Self-Care: Goal: Ability to participate in self-care as condition permits will improve 07/07/2023 0556 by Brant Caldron, RN Outcome: Progressing 07/07/2023 0422 by Brant Caldron, RN Outcome: Progressing Goal: Verbalization of feelings and concerns over difficulty with  self-care will improve 07/07/2023 0556 by Brant Caldron, RN Outcome: Progressing 07/07/2023 0422 by Brant Caldron, RN Outcome: Progressing Goal: Ability to communicate needs accurately will improve 07/07/2023 0556 by Brant Caldron, RN Outcome: Progressing 07/07/2023 0422 by Brant Caldron, RN Outcome: Progressing   Problem: Nutrition: Goal: Risk of aspiration will decrease 07/07/2023 0556 by Brant Caldron, RN Outcome: Progressing 07/07/2023 0422 by Brant Caldron, RN Outcome: Progressing Goal: Dietary intake will improve 07/07/2023 0556 by Brant Caldron, RN Outcome: Progressing 07/07/2023 0422 by Brant Caldron, RN Outcome: Progressing   Problem: Activity: Goal: Ability to tolerate increased activity will improve 07/07/2023 0556 by Brant Caldron, RN Outcome: Progressing 07/07/2023 0422 by Brant Caldron, RN Outcome: Progressing   Problem: Respiratory: Goal: Ability to maintain a clear airway and adequate ventilation will improve 07/07/2023 0556 by Brant Caldron, RN Outcome: Progressing 07/07/2023 0422 by Brant Caldron, RN Outcome: Progressing   Problem: Role Relationship: Goal: Method of communication will improve 07/07/2023 0556 by Brant Caldron, RN Outcome: Progressing 07/07/2023 0422 by Brant Caldron, RN Outcome: Progressing   Problem: Education: Goal: Knowledge of General Education information will improve Description: Including pain rating scale, medication(s)/side effects and non-pharmacologic comfort measures 07/07/2023 0556 by Brant Caldron, RN Outcome: Progressing 07/07/2023 0422 by Brant Caldron, RN Outcome: Progressing   Problem: Health Behavior/Discharge Planning: Goal: Ability to manage health-related needs will improve 07/07/2023 0556 by Brant Caldron, RN Outcome: Progressing 07/07/2023 0422 by Brant Caldron, RN Outcome: Progressing   Problem: Clinical Measurements: Goal: Ability to maintain clinical  measurements within normal limits will improve 07/07/2023 0556 by Brant Caldron, RN Outcome: Progressing 07/07/2023 0422 by Brant Caldron, RN Outcome: Progressing Goal: Will remain free from infection 07/07/2023 0556 by Brant Caldron, RN Outcome: Progressing 07/07/2023 0422 by Brant Caldron, RN Outcome: Progressing Goal:  Diagnostic test results will improve 07/07/2023 0556 by Brant Caldron, RN Outcome: Progressing 07/07/2023 0422 by Brant Caldron, RN Outcome: Progressing Goal: Respiratory complications will improve 07/07/2023 0556 by Brant Caldron, RN Outcome: Progressing 07/07/2023 0422 by Brant Caldron, RN Outcome: Progressing Goal: Cardiovascular complication will be avoided 07/07/2023 0556 by Brant Caldron, RN Outcome: Progressing 07/07/2023 0422 by Brant Caldron, RN Outcome: Progressing   Problem: Activity: Goal: Risk for activity intolerance will decrease 07/07/2023 0556 by Brant Caldron, RN Outcome: Progressing 07/07/2023 0422 by Brant Caldron, RN Outcome: Progressing   Problem: Nutrition: Goal: Adequate nutrition will be maintained 07/07/2023 0556 by Brant Caldron, RN Outcome: Progressing 07/07/2023 0422 by Brant Caldron, RN Outcome: Progressing   Problem: Coping: Goal: Level of anxiety will decrease 07/07/2023 0556 by Brant Caldron, RN Outcome: Progressing 07/07/2023 0422 by Brant Caldron, RN Outcome: Progressing   Problem: Elimination: Goal: Will not experience complications related to bowel motility 07/07/2023 0556 by Brant Caldron, RN Outcome: Progressing 07/07/2023 0422 by Brant Caldron, RN Outcome: Progressing Goal: Will not experience complications related to urinary retention 07/07/2023 0556 by Brant Caldron, RN Outcome: Progressing 07/07/2023 0422 by Brant Caldron, RN Outcome: Progressing   Problem: Pain Managment: Goal: General experience of comfort will improve and/or be controlled 07/07/2023 0556  by Brant Caldron, RN Outcome: Progressing 07/07/2023 0422 by Brant Caldron, RN Outcome: Progressing   Problem: Safety: Goal: Ability to remain free from injury will improve 07/07/2023 0556 by Brant Caldron, RN Outcome: Progressing 07/07/2023 0422 by Brant Caldron, RN Outcome: Progressing   Problem: Skin Integrity: Goal: Risk for impaired skin integrity will decrease 07/07/2023 0556 by Brant Caldron, RN Outcome: Progressing 07/07/2023 0422 by Brant Caldron, RN Outcome: Progressing   Problem: Education: Goal: Understanding of CV disease, CV risk reduction, and recovery process will improve 07/07/2023 0556 by Brant Caldron, RN Outcome: Progressing 07/07/2023 0422 by Brant Caldron, RN Outcome: Progressing Goal: Individualized Educational Video(s) 07/07/2023 0556 by Brant Caldron, RN Outcome: Progressing 07/07/2023 0422 by Brant Caldron, RN Outcome: Progressing   Problem: Activity: Goal: Ability to return to baseline activity level will improve 07/07/2023 0556 by Brant Caldron, RN Outcome: Progressing 07/07/2023 0422 by Brant Caldron, RN Outcome: Progressing   Problem: Cardiovascular: Goal: Ability to achieve and maintain adequate cardiovascular perfusion will improve 07/07/2023 0556 by Brant Caldron, RN Outcome: Progressing 07/07/2023 0422 by Brant Caldron, RN Outcome: Progressing Goal: Vascular access site(s) Level 0-1 will be maintained 07/07/2023 0556 by Brant Caldron, RN Outcome: Progressing 07/07/2023 0422 by Brant Caldron, RN Outcome: Progressing   Problem: Health Behavior/Discharge Planning: Goal: Ability to safely manage health-related needs after discharge will improve 07/07/2023 0556 by Brant Caldron, RN Outcome: Progressing 07/07/2023 0422 by Brant Caldron, RN Outcome: Progressing

## 2023-07-07 NOTE — TOC Progression Note (Signed)
 Transition of Care [facility]) - Progression Note    Patient Details  Name: [name] MRN: [id number] Date of Birth: [birth year]  [facility] CM/SW Contact  Tandy Fam, [place] Phone Number: 07/07/2023, 9:49 AM  Clinical Narrative:   CSW spoke with daughter, [name], about disposition. [name] will be going to tour SNFs today, will update CSW afterwards. CSW answered questions about medical workup and placement, daughter appreciative. CSW to follow.    Expected Discharge Plan: Skilled Nursing Facility Barriers to Discharge: Continued Medical Work up, English as a second language teacher  Expected Discharge Plan and Services       Living arrangements for the past 2 months: Single Family Home                                       Social Determinants of Health (SDOH) Interventions SDOH Screenings   Food Insecurity: No Food Insecurity (06/23/2023)  Housing: Low Risk  (06/23/2023)  Transportation Needs: No Transportation Needs (06/23/2023)  Utilities: Not At Risk (06/23/2023)  Alcohol Screen: Low Risk  (10/26/2021)  Depression (PHQ2-9): Low Risk  (12/26/2022)  Financial Resource Strain: Low Risk  (10/29/2019)  Recent Concern: Financial Resource Strain - Medium Risk (09/08/2019)  Physical Activity: Inactive (10/29/2019)  Social Connections: Moderately Integrated (06/23/2023)  Stress: No Stress Concern Present (10/29/2019)  Recent Concern: Stress - Stress Concern Present (09/08/2019)  Tobacco Use: Medium Risk (06/28/2023)    Readmission Risk Interventions    07/01/2023    4:26 PM 09/11/2022    1:23 PM 12/26/2020   12:08 PM  Readmission Risk Prevention Plan  Transportation Screening Complete Complete Complete  PCP or Specialist Appt within 3-5 Days   Complete  HRI or Home Care Consult   Complete  Social Work Consult for Recovery Care Planning/Counseling   Complete  Palliative Care Screening   Not Applicable  Medication Review Oceanographer) Complete Referral to Pharmacy    PCP or Specialist appointment within 3-5 days of discharge Complete Complete   HRI or Home Care Consult Complete Complete   SW Recovery Care/Counseling Consult Complete Complete   Palliative Care Screening Not Applicable Not Applicable   Skilled Nursing Facility Not Applicable Not Applicable

## 2023-07-08 DIAGNOSIS — I63512 Cerebral infarction due to unspecified occlusion or stenosis of left middle cerebral artery: Secondary | ICD-10-CM | POA: Diagnosis not present

## 2023-07-08 DIAGNOSIS — I4891 Unspecified atrial fibrillation: Secondary | ICD-10-CM | POA: Diagnosis not present

## 2023-07-08 DIAGNOSIS — E1122 Type 2 diabetes mellitus with diabetic chronic kidney disease: Secondary | ICD-10-CM | POA: Diagnosis not present

## 2023-07-08 DIAGNOSIS — I69391 Dysphagia following cerebral infarction: Secondary | ICD-10-CM | POA: Diagnosis not present

## 2023-07-08 LAB — GLUCOSE, CAPILLARY
Glucose-Capillary: 148 mg/dL — ABNORMAL HIGH (ref 70–99)
Glucose-Capillary: 150 mg/dL — ABNORMAL HIGH (ref 70–99)
Glucose-Capillary: 161 mg/dL — ABNORMAL HIGH (ref 70–99)
Glucose-Capillary: 163 mg/dL — ABNORMAL HIGH (ref 70–99)
Glucose-Capillary: 226 mg/dL — ABNORMAL HIGH (ref 70–99)
Glucose-Capillary: 292 mg/dL — ABNORMAL HIGH (ref 70–99)

## 2023-07-08 NOTE — Progress Notes (Signed)
 Physical Therapy Treatment Patient Details Name: [name] MRN: [id number] DOB: 04-09-1933 Today's Date: 07/08/2023   History of Present Illness 89 y/o male presented to [facility] ED on 06/28/23 with facial droop and R sided weakness. NIH 25 and imaging showed left MCA infarct with M1 occlusion. Transferred to [facility] and s/p INR L CFA approach. EEG suggestive of severe diffuse encephalopathy likely related to dedation.  Intubated 6/7 -6/9. Recent admission at [facility] with  monomorphic V. Tach s/p cardioversion. PMH: permanent Afib, sick sinus syndrome s/p PPM, HFpEF, CKD stage IIIb, HTN, T2DM, stage IVb metastatic prostate cancer, DVT    PT Comments  Pt received in supine and demonstrates good progress towards goals this session. Pt able to tolerate increased gait distance with up to min A. Pt demonstrates R inattention and drifting during ambulation, however pt demonstrates limited improvement with cues requiring min A for RW and obstacle negotiation. Pt continues to benefit from PT services to progress toward functional mobility goals.    If plan is discharge home, recommend the following: Two people to help with walking and/or transfers;Assistance with cooking/housework;Assist for transportation;Supervision due to cognitive status;Help with stairs or ramp for entrance   Can travel by private vehicle     No  Equipment Recommendations  Wheelchair cushion (measurements PT);Wheelchair (measurements PT);BSC/3in1;Rolling walker (2 wheels)    Recommendations for Other Services       Precautions / Restrictions Precautions Precautions: Fall Recall of Precautions/Restrictions: Impaired Restrictions Weight Bearing Restrictions Per Provider Order: No     Mobility  Bed Mobility Overal bed mobility: Needs Assistance Bed Mobility: Supine to Sit     Supine to sit: Supervision, HOB elevated     General bed mobility comments: increased time    Transfers Overall transfer level: Needs  assistance Equipment used: Rolling walker (2 wheels) Transfers: Sit to/from Stand Sit to Stand: Min assist           General transfer comment: From EOB with cues for anterior weight shift and hand placement    Ambulation/Gait Ambulation/Gait assistance: Min assist, +2 safety/equipment Gait Distance (Feet): 125 Feet Assistive device: Rolling walker (2 wheels) Gait Pattern/deviations: Step-through pattern, Decreased stride length, Trunk flexed, Drifts right/left, Narrow base of support Gait velocity: decreased     General Gait Details: R inattention and drift requiring dense cues for obstacle negotiation and min A for RW management. CGA and chair follow for safety due to some instability, but no LOB.  Cues for R hand placement on RW   Stairs             Wheelchair Mobility     Tilt Bed    Modified Rankin (Stroke Patients Only) Modified Rankin (Stroke Patients Only) Pre-Morbid Rankin Score: No significant disability Modified Rankin: Moderately severe disability     Balance Overall balance assessment: Needs assistance Sitting-balance support: Feet supported Sitting balance-Leahy Scale: Good     Standing balance support: Bilateral upper extremity supported, During functional activity Standing balance-Leahy Scale: Poor Standing balance comment: with RW support                            Communication Communication Communication: Impaired Factors Affecting Communication: Difficulty expressing self;Reduced clarity of speech  Cognition Arousal: Alert Behavior During Therapy: WFL for tasks assessed/performed   PT - Cognitive impairments: Difficult to assess Difficult to assess due to: Impaired communication  Following commands: Impaired Following commands impaired: Follows one step commands with increased time, Only follows one step commands consistently    Cueing Cueing Techniques: Verbal cues, Tactile cues, Gestural  cues  Exercises      General Comments        Pertinent Vitals/Pain Pain Assessment Pain Assessment: Faces Faces Pain Scale: No hurt     PT Goals (current goals can now be found in the care plan section) Acute Rehab PT Goals Patient Stated Goal: unable to state PT Goal Formulation: With patient/family Time For Goal Achievement: 07/15/23 Progress towards PT goals: Progressing toward goals    Frequency    Min 2X/week       AM-PAC PT 6 Clicks Mobility   Outcome Measure  Help needed turning from your back to your side while in a flat bed without using bedrails?: A Little Help needed moving from lying on your back to sitting on the side of a flat bed without using bedrails?: A Little Help needed moving to and from a bed to a chair (including a wheelchair)?: A Little Help needed standing up from a chair using your arms (e.g., wheelchair or bedside chair)?: A Little Help needed to walk in hospital room?: A Little Help needed climbing 3-5 steps with a railing? : Total 6 Click Score: 16    End of Session Equipment Utilized During Treatment: Gait belt Activity Tolerance: Patient tolerated treatment well Patient left: in chair;with call bell/phone within reach;with chair alarm set Nurse Communication: Mobility status PT Visit Diagnosis: Unsteadiness on feet (R26.81);Muscle weakness (generalized) (M62.81);Difficulty in walking, not elsewhere classified (R26.2);Hemiplegia and hemiparesis     Time: 1610-9604 PT Time Calculation (min) (ACUTE ONLY): 26 min  Charges:    $Gait Training: 8-22 mins $Therapeutic Activity: 8-22 mins PT General Charges $$ ACUTE PT VISIT: 1 Visit                     Michaelle Adolphus, PTA Acute Rehabilitation Services Secure Chat Preferred  [phone]    Michaelle Adolphus 07/08/2023, 10:49 AM

## 2023-07-08 NOTE — Plan of Care (Signed)
  Problem: Education: Goal: Knowledge of disease or condition will improve 07/08/2023 0506 by Gene Kemps, RN Outcome: Progressing 07/08/2023 0503 by Gene Kemps, RN Outcome: Progressing   Problem: Ischemic Stroke/TIA Tissue Perfusion: Goal: Complications of ischemic stroke/TIA will be minimized 07/08/2023 0506 by Gene Kemps, RN Outcome: Progressing 07/08/2023 0503 by Gene Kemps, RN Outcome: Progressing   Problem: Health Behavior/Discharge Planning: Goal: Goals will be collaboratively established with patient/family 07/08/2023 0506 by Gene Kemps, RN Outcome: Progressing 07/08/2023 0503 by Gene Kemps, RN Outcome: Progressing   Problem: Respiratory: Goal: Ability to maintain a clear airway and adequate ventilation will improve Outcome: Progressing   Problem: Clinical Measurements: Goal: Respiratory complications will improve Outcome: Progressing   Problem: Activity: Goal: Risk for activity intolerance will decrease Outcome: Progressing   Problem: Pain Managment: Goal: General experience of comfort will improve and/or be controlled 07/08/2023 0506 by Gene Kemps, RN Outcome: Progressing 07/08/2023 0503 by Gene Kemps, RN Outcome: Progressing   Problem: Coping: Goal: Will verbalize positive feelings about self Outcome: Not Progressing   Problem: Self-Care: Goal: Ability to communicate needs accurately will improve Outcome: Not Progressing

## 2023-07-08 NOTE — Plan of Care (Signed)
  Problem: Education: Goal: Knowledge of disease or condition will improve Outcome: Progressing Goal: Knowledge of secondary prevention will improve (MUST DOCUMENT ALL) Outcome: Progressing Goal: Knowledge of patient specific risk factors will improve (DELETE if not current risk factor) Outcome: Progressing   Problem: Ischemic Stroke/TIA Tissue Perfusion: Goal: Complications of ischemic stroke/TIA will be minimized Outcome: Progressing   Problem: Coping: Goal: Will verbalize positive feelings about self Outcome: Progressing Goal: Will identify appropriate support needs Outcome: Progressing   Problem: Health Behavior/Discharge Planning: Goal: Ability to manage health-related needs will improve Outcome: Progressing Goal: Goals will be collaboratively established with patient/family Outcome: Progressing   Problem: Self-Care: Goal: Ability to participate in self-care as condition permits will improve Outcome: Progressing Goal: Verbalization of feelings and concerns over difficulty with self-care will improve Outcome: Progressing Goal: Ability to communicate needs accurately will improve Outcome: Progressing   Problem: Nutrition: Goal: Risk of aspiration will decrease Outcome: Progressing Goal: Dietary intake will improve Outcome: Progressing   Problem: Activity: Goal: Ability to tolerate increased activity will improve Outcome: Progressing   Problem: Respiratory: Goal: Ability to maintain a clear airway and adequate ventilation will improve Outcome: Progressing   Problem: Role Relationship: Goal: Method of communication will improve Outcome: Progressing   Problem: Education: Goal: Knowledge of General Education information will improve Description: Including pain rating scale, medication(s)/side effects and non-pharmacologic comfort measures Outcome: Progressing   Problem: Health Behavior/Discharge Planning: Goal: Ability to manage health-related needs will  improve Outcome: Progressing   Problem: Clinical Measurements: Goal: Ability to maintain clinical measurements within normal limits will improve Outcome: Progressing Goal: Will remain free from infection Outcome: Progressing Goal: Diagnostic test results will improve Outcome: Progressing Goal: Respiratory complications will improve Outcome: Progressing Goal: Cardiovascular complication will be avoided Outcome: Progressing   Problem: Activity: Goal: Risk for activity intolerance will decrease Outcome: Progressing   Problem: Nutrition: Goal: Adequate nutrition will be maintained Outcome: Progressing   Problem: Coping: Goal: Level of anxiety will decrease Outcome: Progressing   Problem: Elimination: Goal: Will not experience complications related to bowel motility Outcome: Progressing Goal: Will not experience complications related to urinary retention Outcome: Progressing   Problem: Pain Managment: Goal: General experience of comfort will improve and/or be controlled Outcome: Progressing   Problem: Safety: Goal: Ability to remain free from injury will improve Outcome: Progressing   Problem: Skin Integrity: Goal: Risk for impaired skin integrity will decrease Outcome: Progressing   Problem: Education: Goal: Understanding of CV disease, CV risk reduction, and recovery process will improve Outcome: Progressing Goal: Individualized Educational Video(s) Outcome: Progressing   Problem: Activity: Goal: Ability to return to baseline activity level will improve Outcome: Progressing   Problem: Cardiovascular: Goal: Ability to achieve and maintain adequate cardiovascular perfusion will improve Outcome: Progressing Goal: Vascular access site(s) Level 0-1 will be maintained Outcome: Progressing   Problem: Health Behavior/Discharge Planning: Goal: Ability to safely manage health-related needs after discharge will improve Outcome: Progressing

## 2023-07-08 NOTE — TOC Progression Note (Signed)
 Transition of Care [facility]) - Progression Note    Patient Details  Name: SUFYAAN PALMA MRN: [id number] Date of Birth: [birth year]  Transition of Care [facility]) CM/SW Contact  Tandy Fam, [place] Phone Number: 07/08/2023, 4:24 PM  Clinical Narrative:   CSW spoke with patient's daughter, [name], to discuss SNF options. Tiffany toured [facility] and would like to accept the bed offer there whenever patient is ready for discharge. Patient continues with bloody urine, Tiffany is hopeful that there may be an answer to what is causing that soon. CSW to follow.    Expected Discharge Plan: Skilled Nursing Facility Barriers to Discharge: Continued Medical Work up, English as a second language teacher  Expected Discharge Plan and Services       Living arrangements for the past 2 months: Single Family Home                                       Social Determinants of Health (SDOH) Interventions SDOH Screenings   Food Insecurity: No Food Insecurity (06/23/2023)  Housing: Low Risk  (06/23/2023)  Transportation Needs: No Transportation Needs (06/23/2023)  Utilities: Not At Risk (06/23/2023)  Alcohol Screen: Low Risk  (10/26/2021)  Depression (PHQ2-9): Low Risk  (12/26/2022)  Financial Resource Strain: Low Risk  (10/29/2019)  Recent Concern: Financial Resource Strain - Medium Risk (09/08/2019)  Physical Activity: Inactive (10/29/2019)  Social Connections: Moderately Integrated (06/23/2023)  Stress: No Stress Concern Present (10/29/2019)  Recent Concern: Stress - Stress Concern Present (09/08/2019)  Tobacco Use: Medium Risk (06/28/2023)    Readmission Risk Interventions    07/01/2023    4:26 PM 09/11/2022    1:23 PM 12/26/2020   12:08 PM  Readmission Risk Prevention Plan  Transportation Screening Complete Complete Complete  PCP or Specialist Appt within 3-5 Days   Complete  HRI or Home Care Consult   Complete  Social Work Consult for Recovery Care Planning/Counseling   Complete  Palliative Care  Screening   Not Applicable  Medication Review Oceanographer) Complete Referral to Pharmacy   PCP or Specialist appointment within 3-5 days of discharge Complete Complete   HRI or Home Care Consult Complete Complete   SW Recovery Care/Counseling Consult Complete Complete   Palliative Care Screening Not Applicable Not Applicable   Skilled Nursing Facility Not Applicable Not Applicable

## 2023-07-08 NOTE — Plan of Care (Signed)
  Problem: Education: Goal: Knowledge of disease or condition will improve Outcome: Progressing   Problem: Ischemic Stroke/TIA Tissue Perfusion: Goal: Complications of ischemic stroke/TIA will be minimized Outcome: Progressing   Problem: Health Behavior/Discharge Planning: Goal: Goals will be collaboratively established with patient/family Outcome: Progressing   Problem: Respiratory: Goal: Ability to maintain a clear airway and adequate ventilation will improve Outcome: Progressing   Problem: Clinical Measurements: Goal: Respiratory complications will improve Outcome: Progressing   Problem: Pain Managment: Goal: General experience of comfort will improve and/or be controlled Outcome: Progressing   Problem: Self-Care: Goal: Ability to communicate needs accurately will improve Outcome: Not Progressing

## 2023-07-08 NOTE — Plan of Care (Signed)
  Problem: Nutrition: Goal: Risk of aspiration will decrease Outcome: Progressing Goal: Dietary intake will improve Outcome: Progressing   Problem: Respiratory: Goal: Ability to maintain a clear airway and adequate ventilation will improve Outcome: Progressing   Problem: Education: Goal: Knowledge of disease or condition will improve Outcome: Not Progressing Goal: Knowledge of secondary prevention will improve (MUST DOCUMENT ALL) Outcome: Not Progressing Goal: Knowledge of patient specific risk factors will improve (DELETE if not current risk factor) Outcome: Not Progressing   Problem: Ischemic Stroke/TIA Tissue Perfusion: Goal: Complications of ischemic stroke/TIA will be minimized Outcome: Not Progressing   Problem: Coping: Goal: Will identify appropriate support needs Outcome: Not Progressing   Problem: Health Behavior/Discharge Planning: Goal: Ability to manage health-related needs will improve Outcome: Not Progressing   Problem: Self-Care: Goal: Ability to communicate needs accurately will improve Outcome: Not Progressing

## 2023-07-08 NOTE — Progress Notes (Addendum)
 STROKE TEAM PROGRESS NOTE    SIGNIFICANT HOSPITAL EVENTS 6/7-patient admitted, taken to interventional radiology with successful revascularization of left MCA M1 segment 6/10: MRI brain with evolving acute to early subacute ischemic left MCA infarct 6/11: Passed MBS. Transferred out of ICU.   INTERIM HISTORY/SUBJECTIVE  No family at bedside.  Patient sitting up in bed, continued stable neuroexam.  No acute events overnight.  Pending SNF bed availability for discharge, medically stable.   CBC    Component Value Date/Time   WBC 8.6 07/06/2023 0713   RBC 4.49 07/06/2023 0713   HGB 13.9 07/06/2023 0713   HGB 15.2 04/29/2023 1259   HGB 14.6 06/11/2019 0953   HCT 43.1 07/06/2023 0713   HCT 46.0 06/11/2019 0953   PLT 398 07/06/2023 0713   PLT 189 04/29/2023 1259   PLT 154 06/11/2019 0953   MCV 96.0 07/06/2023 0713   MCV 94 06/11/2019 0953   MCV 92 10/13/2013 0415   MCH 31.0 07/06/2023 0713   MCHC 32.3 07/06/2023 0713   RDW 15.2 07/06/2023 0713   RDW 13.3 06/11/2019 0953   RDW 15.3 (H) 10/13/2013 0415   LYMPHSABS 0.3 (L) 06/29/2023 0458   LYMPHSABS 2.4 10/13/2013 0415   MONOABS 1.0 06/29/2023 0458   MONOABS 0.8 10/13/2013 0415   EOSABS 0.1 06/29/2023 0458   EOSABS 0.2 10/13/2013 0415   BASOSABS 0.0 06/29/2023 0458   BASOSABS 0.0 10/13/2013 0415    BMET    Component Value Date/Time   NA 141 07/06/2023 0713   NA 144 10/11/2021 0936   NA 138 10/13/2013 0415   K 4.0 07/06/2023 0713   K 4.0 10/13/2013 0415   CL 105 07/06/2023 0713   CL 106 10/13/2013 0415   CO2 23 07/06/2023 0713   CO2 27 10/13/2013 0415   GLUCOSE 165 (H) 07/06/2023 0713   GLUCOSE 97 10/13/2013 0415   BUN 27 (H) 07/06/2023 0713   BUN 46 (H) 10/11/2021 0936   BUN 22 (H) 10/13/2013 0415   CREATININE 1.40 (H) 07/06/2023 0713   CREATININE 2.17 (H) 04/29/2023 1259   CREATININE 1.65 (H) 10/13/2013 0415   CALCIUM  9.0 07/06/2023 0713   CALCIUM  8.2 (L) 10/13/2013 0415   EGFR 22 (L) 10/11/2021 0936    GFRNONAA 48 (L) 07/06/2023 0713   GFRNONAA 29 (L) 04/29/2023 1259   GFRNONAA 39 (L) 10/13/2013 0415    IMAGING past 24 hours No results found.   Vitals:   07/07/23 2100 07/07/23 2320 07/08/23 0608 07/08/23 0754  BP: (!) 156/79 137/73 135/89 114/76  Pulse:  (!) 109 60 (!) 115  Resp:  18 18 14   Temp: 97.7 F (36.5 C) 97.8 F (36.6 C) 98 F (36.7 C) 98.9 F (37.2 C)  TempSrc: Oral Oral  Oral  SpO2: 92% 97% 99% 100%  Weight:      Height:        PHYSICAL EXAM General:Elderly patient in no acute distress Psych: Calm and cooperative CV: A-fib on monitor Respiratory: Regular, unlabored breathing, room air  NEURO  Eyes open spontaneously, global aphasia. EOMI, fully tracks examiner.  PERRL. Antigravity strength, spontaneous movement of all 4 with RUE slightly weaker   ASSESSMENT/PLAN  [name] is a 89 y.o. male with history of A-fib on edoxaban  (switched to edoxaban  as Eliquis  interacted with prostate cancer treatment), CHF, left MCA stroke in 2021 status post thrombectomy, diabetes, hypertension, hyperlipidemia, sleep apnea on CPAP, CKD stage IIIb and prostate cancer on Xtandi  and leuprolide  admitted for acute onset right  facial droop, right-sided weakness and altered mental status.  He was found to have left MCA M1 occlusion and was taken to interventional radiology for successful mechanical thrombectomy.  He remained intubated after the procedure.  Extubated 6/9.  NIH on Admission 26  Stroke:  left MCA infarct with left M1 occlusion s/p IR with TICI3, etiology likely A-fib on DOAC in the setting of recent cardioversion Code Stroke CT head stable acute cytotoxic edema in left MCA territory ASPECTS 7 CTA head & neck recurrent left MCA M1 occlusion, left VA moderate stenosis at origin, right VA occlusion Status post IR with TICI3 Follow-up CT Evolving left MCA territory infarct with mild edema and sulcal  effacement. No midline shift. Foci of hyperattenuation in the  posterior left temporal lobe suggestive of developing petechial hemorrhage. Hyperdense appearance of the distal left M1 segment and left M2  MRI Evolving acute to early subacute ischemic nonhemorrhagic left MCA infarct. Pan CT showed no advance malignancy  2D Echo EF 45 to 50% LDL 32 HgbA1c 8.7 VTE prophylaxis - Edoxaban  Edoxaban  prior to admission, continue resumed Edoxaban  Therapy recommendations: CIR but changed to SNF given lack of home support Disposition: SNF pending family choice  Hx of Stroke/TIA 08/2019 admitted for left BG/CR and caudate infarct.  CTA head and neck left M1 occlusion, right MCA occlusion.  CTP 0/98 cc.  S/p IR with TICI3.  MRI post IR showed left M1 patent.  EF 60 to 65%.  LDL 27, A1c 7.8.  Discharged on aspirin  325 and Lipitor 10.  Recommend discussed with patient cardiologist regarding DOAC.  Atrial fibrillation Was on pradaxa , then changed to eliquis  - ? Due to rectal bleeding ? AC was held during 06/2019 [facility] admission due to rectal bleeding and high-risk of recurrent bleeding.  CHADS-VASC score of 5 at that time, with 7.2% risk of stroke per year, but need to weigh risk of stroke versus risk of recurrent GI bleeding with Eliquis . At the time of discharge, a mutual decision was made with patient, family, and medical team to not restart anticoagulation for fear of recurrence of life-threatening bleeding Stroke in 08/2019 as above, on discharge patient and family reluctant to start DOAC, therefore they were recommended to discuss with his cardiologist regarding DOAC as outpatient. Was put back on eliquis  but then changed to edoxaban  as Eliquis  has interaction with prostate cancer medications. Current home meds including amiodarone  200 mg daily, verapamil  180 mg daily, edoxaban  30 mg daily Cardiac cath and cardioversion 2 days prior to admission Still in afib RVR with pacing, continue telemetry Continue home amiodarone  and verapamil   Amio drip-->transitioned to PO  6/10 Continue Edoxaban   Not Watchman device candidate at this time  Hypertension Home meds: Verapamil , on hold Stable Long-term BP goal normotensive  Hyperlipidemia Home meds: Atorvastatin  10 mg daily, continue LDL 32, goal < 70 High intensity statin not indicated as LDL below goal Continue statin at discharge  Diabetes type II poorly controlled Home meds:  Farxiga  10 mg daily HgbA1c 8.7, goal < 7.0 CBGs SSI Recommend close follow-up with PCP for better DM control  Dysphagia Patient has post-stroke dysphagia SLP following Failed bedside swallow evaluation MBS passed 6/11 on DYS1 and thin liquids  Other Stroke Risk Factors Obesity, Body mass index is 31.27 kg/m., BMI >/= 30 associated with increased stroke risk, recommend weight loss, diet and exercise as appropriate  Obstructive sleep apnea, on CPAP at home CHF  Other Active Problems Acute Cystitis Urinalysis with large leukocytes  CTAP with no  evidence of hemorrhage, but there is increase bladder wall prominence concerning for cystitis.  Continue Ceftriaxone , started 6/15 Prostate cancer-plan to resume home Xtandi , non-formulary, family has not brought in History of rectal bleeding CKD 3B, creatinine 2.26--1.82-1.33-1.47-1.51--1.48-1.43-1.52  Hospital day # 10  Patient seen by NP with MD, MD to edit note as needed.  Audrene Lease, DNP, AGACNP-BC [facility] Please use AMION for contact information & EPIC for messaging.  07/08/2023 11:57 AM   I have personally obtained history,examined this patient, reviewed notes, independently viewed imaging studies, participated in medical decision making and plan of care.ROS completed by me personally and pertinent positives fully documented  I have made any additions or clarifications directly to the above note. Agree with note above.  Patient neurologically stable to be transferred to skilled nursing facility when bed available.  Ardella Beaver, MD Medical  Director [facility] Pager: [phone] 07/08/2023 2:47 PM

## 2023-07-08 NOTE — Progress Notes (Signed)
 Occupational Therapy Treatment Patient Details Name: BENNETT RAM MRN: [id number] DOB: [birth year] Today's Date: 07/08/2023   History of present illness 89 y/o male presented to [facility] ED on 06/28/23 with facial droop and R sided weakness. NIH 25 and imaging showed left MCA infarct with M1 occlusion. Transferred to [facility] and s/p INR L CFA approach. EEG suggestive of severe diffuse encephalopathy likely related to dedation.  Intubated 6/7 -6/9. Recent admission at [facility] with  monomorphic V. Tach s/p cardioversion. PMH: permanent Afib, sick sinus syndrome s/p PPM, HFpEF, CKD stage IIIb, HTN, T2DM, stage IVb metastatic prostate cancer, DVT   OT comments  Patient in recliner, engaged in OT session.  Worked on BUE exercises, bringing hands to midline, and ADLs to promote increased attention and functional use of R UE.  Patient requires multimodal cueing initially but with functional tasks able to demonstrate grasp of R hand to hold lotion, needing assist to squeeze into L hand but using both hands to apply to hands and legs.  Overall min assist to stand, mod assist for LB bathing, and max assist for LB dressing.  Limited verbalizations.  Will follow acutely, continue to recommend >3hrs/day inpatient setting.       If plan is discharge home, recommend the following:  Two people to help with walking and/or transfers;Two people to help with bathing/dressing/bathroom;Assistance with cooking/housework;Assistance with feeding;Direct supervision/assist for medications management;Direct supervision/assist for financial management;Assist for transportation;Help with stairs or ramp for entrance;Supervision due to cognitive status   Equipment Recommendations  Other (comment) (defer)    Recommendations for Other Services Rehab consult;Speech consult    Precautions / Restrictions Precautions Precautions: Fall Recall of Precautions/Restrictions: Impaired Restrictions Weight Bearing Restrictions Per Provider Order:  No       Mobility Bed Mobility               General bed mobility comments: OOB upon entry    Transfers Overall transfer level: Needs assistance Equipment used: Rolling walker (2 wheels) Transfers: Sit to/from Stand Sit to Stand: Min assist           General transfer comment: min assist to power up, placement of R hand on arm rest. support to steady until fully upright     Balance Overall balance assessment: Needs assistance Sitting-balance support: Feet supported Sitting balance-Leahy Scale: Good     Standing balance support: Bilateral upper extremity supported, During functional activity Standing balance-Leahy Scale: Poor Standing balance comment: with RW support                           ADL either performed or assessed with clinical judgement   ADL Overall ADL's : Needs assistance/impaired     Grooming: Wash/dry hands;Set up;Sitting Grooming Details (indicate cue type and reason): using wash cloth to dry hands     Lower Body Bathing: Moderate assistance;Sit to/from stand Lower Body Bathing Details (indicate cue type and reason): simulated bathing using lotion, assist for lower legs and when standing.  Pt able to use R UE to don lotion to R LE.     Lower Body Dressing: Maximal assistance;Sit to/from stand               Functional mobility during ADLs: Minimal assistance      Extremity/Trunk Assessment Upper Extremity Assessment Upper Extremity Assessment: RUE deficits/detail RUE Deficits / Details: R UE inattention, pt demonstrating ability to raise UE to at least 90* shoulder flexion, flex/extend elbow and grasp with hand.  Using functionally with cueing for intitation.  PROM WFL. RUE Sensation: decreased light touch;decreased proprioception RUE Coordination: decreased fine motor;decreased gross motor            Vision       Perception Perception Perception: Impaired Preception Impairment Details:  Inattention/Neglect Perception-Other Comments: R neglect   Praxis Praxis Praxis: Impaired Praxis Impairment Details: Ideation;Ideomotor   Communication Communication Communication: Impaired Factors Affecting Communication: Difficulty expressing self;Reduced clarity of speech   Cognition Arousal: Alert Behavior During Therapy: WFL for tasks assessed/performed Cognition: Cognition impaired, Difficult to assess Difficult to assess due to: Impaired communication   Awareness: Intellectual awareness impaired, Online awareness impaired   Attention impairment (select first level of impairment): Focused attention   OT - Cognition Comments: remains with very limited verbalizations                 Following commands: Impaired Following commands impaired: Follows one step commands inconsistently, Follows one step commands with increased time      Cueing   Cueing Techniques: Verbal cues, Tactile cues, Gestural cues  Exercises Exercises: Other exercises    Shoulder Instructions       General Comments VSS on RA, primo fit leaking- NT assisting to change upon exit    Pertinent Vitals/ Pain       Pain Assessment Pain Assessment: Faces Faces Pain Scale: No hurt Pain Intervention(s): Monitored during session  Home Living                                          Prior Functioning/Environment              Frequency  Min 2X/week        Progress Toward Goals  OT Goals(current goals can now be found in the care plan section)  Progress towards OT goals: Progressing toward goals  Acute Rehab OT Goals Patient Stated Goal: unable OT Goal Formulation: Patient unable to participate in goal setting Time For Goal Achievement: 07/15/23 Potential to Achieve Goals: Good  Plan      Co-evaluation                 AM-PAC OT 6 Clicks Daily Activity     Outcome Measure   Help from another person eating meals?: A Little Help from another person  taking care of personal grooming?: A Lot Help from another person toileting, which includes using toliet, bedpan, or urinal?: Total Help from another person bathing (including washing, rinsing, drying)?: A Lot Help from another person to put on and taking off regular upper body clothing?: A Lot Help from another person to put on and taking off regular lower body clothing?: A Lot 6 Click Score: 12    End of Session Equipment Utilized During Treatment: Rolling walker (2 wheels)  OT Visit Diagnosis: Unsteadiness on feet (R26.81);Muscle weakness (generalized) (M62.81);Other abnormalities of gait and mobility (R26.89);Low vision, both eyes (H54.2);Apraxia (R48.2);Feeding difficulties (R63.3);Other symptoms and signs involving cognitive function;Cognitive communication deficit (R41.841);Hemiplegia and hemiparesis Symptoms and signs involving cognitive functions: Cerebral infarction Hemiplegia - Right/Left: Right Hemiplegia - caused by: Cerebral infarction   Activity Tolerance Patient tolerated treatment well   Patient Left in chair;with call bell/phone within reach;with chair alarm set;with nursing/sitter in room   Nurse Communication Mobility status        Time: 1610-9604 OT Time Calculation (min): 20 min  Charges: OT General Charges $OT  Visit: 1 Visit OT Treatments $Self Care/Home Management : 8-22 mins  Bary Boss, OT Acute Rehabilitation Services Office [phone] Secure Chat Preferred    Fredrich Jefferson 07/08/2023, 1:28 PM

## 2023-07-09 ENCOUNTER — Other Ambulatory Visit: Payer: Self-pay

## 2023-07-09 DIAGNOSIS — I69391 Dysphagia following cerebral infarction: Secondary | ICD-10-CM | POA: Diagnosis not present

## 2023-07-09 DIAGNOSIS — E1122 Type 2 diabetes mellitus with diabetic chronic kidney disease: Secondary | ICD-10-CM | POA: Diagnosis not present

## 2023-07-09 DIAGNOSIS — I4891 Unspecified atrial fibrillation: Secondary | ICD-10-CM | POA: Diagnosis not present

## 2023-07-09 DIAGNOSIS — I63512 Cerebral infarction due to unspecified occlusion or stenosis of left middle cerebral artery: Secondary | ICD-10-CM | POA: Diagnosis not present

## 2023-07-09 LAB — GLUCOSE, CAPILLARY
Glucose-Capillary: 173 mg/dL — ABNORMAL HIGH (ref 70–99)
Glucose-Capillary: 192 mg/dL — ABNORMAL HIGH (ref 70–99)
Glucose-Capillary: 195 mg/dL — ABNORMAL HIGH (ref 70–99)

## 2023-07-09 NOTE — Inpatient Diabetes Management (Signed)
 Inpatient Diabetes Program Recommendations  AACE/ADA: New Consensus Statement on Inpatient Glycemic Control (2015)  Target Ranges:  Prepandial:   less than 140 mg/dL      Peak postprandial:   less than 180 mg/dL (1-2 hours)      Critically ill patients:  140 - 180 mg/dL   Lab Results  Component Value Date   GLUCAP 173 (H) 07/09/2023   HGBA1C 6.9 (H) 06/29/2023    Review of Glycemic Control  Latest Reference Range & Units 07/08/23 07:51 07/08/23 12:14 07/08/23 15:55 07/08/23 20:20 07/08/23 23:40 07/09/23 03:44  Glucose-Capillary 70 - 99 mg/dL 638 (H) 756 (H) 433 (H) 292 (H) 226 (H) 173 (H)  (H): Data is abnormally high  Diabetes history: DM2 Outpatient Diabetes medications: Farxiga  10 mg every day, Ozempic  0.5 mg weekly Current orders for Inpatient glycemic control: CBGs  Inpatient Diabetes Program Recommendations:    Please consider:  Glycemic control order set using Novolog  0-6 units TID.  Thank you, Hays Lipschutz, MSN, CDCES Diabetes Coordinator Inpatient Diabetes Program [phone] (team pager from 8a-5p)

## 2023-07-09 NOTE — Plan of Care (Signed)
  Problem: Education: Goal: Knowledge of disease or condition will improve Outcome: Progressing Goal: Knowledge of secondary prevention will improve (MUST DOCUMENT ALL) Outcome: Progressing Goal: Knowledge of patient specific risk factors will improve (DELETE if not current risk factor) Outcome: Progressing   Problem: Ischemic Stroke/TIA Tissue Perfusion: Goal: Complications of ischemic stroke/TIA will be minimized Outcome: Progressing   Problem: Coping: Goal: Will verbalize positive feelings about self Outcome: Progressing Goal: Will identify appropriate support needs Outcome: Progressing   Problem: Health Behavior/Discharge Planning: Goal: Ability to manage health-related needs will improve Outcome: Progressing Goal: Goals will be collaboratively established with patient/family Outcome: Progressing   Problem: Self-Care: Goal: Ability to participate in self-care as condition permits will improve Outcome: Progressing Goal: Verbalization of feelings and concerns over difficulty with self-care will improve Outcome: Progressing Goal: Ability to communicate needs accurately will improve Outcome: Progressing   Problem: Nutrition: Goal: Risk of aspiration will decrease Outcome: Progressing Goal: Dietary intake will improve Outcome: Progressing   Problem: Activity: Goal: Ability to tolerate increased activity will improve Outcome: Progressing   Problem: Respiratory: Goal: Ability to maintain a clear airway and adequate ventilation will improve Outcome: Progressing   Problem: Role Relationship: Goal: Method of communication will improve Outcome: Progressing   Problem: Education: Goal: Knowledge of General Education information will improve Description: Including pain rating scale, medication(s)/side effects and non-pharmacologic comfort measures Outcome: Progressing   Problem: Health Behavior/Discharge Planning: Goal: Ability to manage health-related needs will  improve Outcome: Progressing   Problem: Clinical Measurements: Goal: Ability to maintain clinical measurements within normal limits will improve Outcome: Progressing Goal: Will remain free from infection Outcome: Progressing Goal: Diagnostic test results will improve Outcome: Progressing Goal: Respiratory complications will improve Outcome: Progressing Goal: Cardiovascular complication will be avoided Outcome: Progressing   Problem: Activity: Goal: Risk for activity intolerance will decrease Outcome: Progressing   Problem: Nutrition: Goal: Adequate nutrition will be maintained Outcome: Progressing   Problem: Coping: Goal: Level of anxiety will decrease Outcome: Progressing   Problem: Elimination: Goal: Will not experience complications related to bowel motility Outcome: Progressing Goal: Will not experience complications related to urinary retention Outcome: Progressing   Problem: Pain Managment: Goal: General experience of comfort will improve and/or be controlled Outcome: Progressing   Problem: Safety: Goal: Ability to remain free from injury will improve Outcome: Progressing   Problem: Skin Integrity: Goal: Risk for impaired skin integrity will decrease Outcome: Progressing   Problem: Education: Goal: Understanding of CV disease, CV risk reduction, and recovery process will improve Outcome: Progressing Goal: Individualized Educational Video(s) Outcome: Progressing   Problem: Activity: Goal: Ability to return to baseline activity level will improve Outcome: Progressing   Problem: Cardiovascular: Goal: Ability to achieve and maintain adequate cardiovascular perfusion will improve Outcome: Progressing Goal: Vascular access site(s) Level 0-1 will be maintained Outcome: Progressing   Problem: Health Behavior/Discharge Planning: Goal: Ability to safely manage health-related needs after discharge will improve Outcome: Progressing

## 2023-07-09 NOTE — Progress Notes (Addendum)
 STROKE TEAM PROGRESS NOTE    SIGNIFICANT HOSPITAL EVENTS 6/7-patient admitted, taken to interventional radiology with successful revascularization of left MCA M1 segment 6/10: MRI brain with evolving acute to early subacute ischemic left MCA infarct 6/11: Passed MBS. Transferred out of ICU.   INTERIM HISTORY/SUBJECTIVE  No family at bedside. Neurological exam remains stable and unchanged.  Vitals are stable Waiting on SNF bed, he is medically stable and ready for discharge when bed is available  CBC    Component Value Date/Time   WBC 8.6 07/06/2023 0713   RBC 4.49 07/06/2023 0713   HGB 13.9 07/06/2023 0713   HGB 15.2 04/29/2023 1259   HGB 14.6 06/11/2019 0953   HCT 43.1 07/06/2023 0713   HCT 46.0 06/11/2019 0953   PLT 398 07/06/2023 0713   PLT 189 04/29/2023 1259   PLT 154 06/11/2019 0953   MCV 96.0 07/06/2023 0713   MCV 94 06/11/2019 0953   MCV 92 10/13/2013 0415   MCH 31.0 07/06/2023 0713   MCHC 32.3 07/06/2023 0713   RDW 15.2 07/06/2023 0713   RDW 13.3 06/11/2019 0953   RDW 15.3 (H) 10/13/2013 0415   LYMPHSABS 0.3 (L) 06/29/2023 0458   LYMPHSABS 2.4 10/13/2013 0415   MONOABS 1.0 06/29/2023 0458   MONOABS 0.8 10/13/2013 0415   EOSABS 0.1 06/29/2023 0458   EOSABS 0.2 10/13/2013 0415   BASOSABS 0.0 06/29/2023 0458   BASOSABS 0.0 10/13/2013 0415    BMET    Component Value Date/Time   NA 141 07/06/2023 0713   NA 144 10/11/2021 0936   NA 138 10/13/2013 0415   K 4.0 07/06/2023 0713   K 4.0 10/13/2013 0415   CL 105 07/06/2023 0713   CL 106 10/13/2013 0415   CO2 23 07/06/2023 0713   CO2 27 10/13/2013 0415   GLUCOSE 165 (H) 07/06/2023 0713   GLUCOSE 97 10/13/2013 0415   BUN 27 (H) 07/06/2023 0713   BUN 46 (H) 10/11/2021 0936   BUN 22 (H) 10/13/2013 0415   CREATININE 1.40 (H) 07/06/2023 0713   CREATININE 2.17 (H) 04/29/2023 1259   CREATININE 1.65 (H) 10/13/2013 0415   CALCIUM  9.0 07/06/2023 0713   CALCIUM  8.2 (L) 10/13/2013 0415   EGFR 22 (L) 10/11/2021 0936    GFRNONAA 48 (L) 07/06/2023 0713   GFRNONAA 29 (L) 04/29/2023 1259   GFRNONAA 39 (L) 10/13/2013 0415    IMAGING past 24 hours No results found.   Vitals:   07/08/23 2001 07/08/23 2337 07/09/23 0342 07/09/23 0801  BP: 116/66 (!) 144/111 123/82 99/76  Pulse: 86 67 98   Resp: 18 16 18 18   Temp: 99.3 F (37.4 C) 98.9 F (37.2 C) 98.8 F (37.1 C) 97.6 F (36.4 C)  TempSrc: Oral Oral Oral Oral  SpO2: 91% 96% 90% 94%  Weight:      Height:        PHYSICAL EXAM General:Elderly patient in no acute distress Psych: Calm and cooperative CV: A-fib on monitor Respiratory: Regular, unlabored breathing, room air  NEURO  Eyes open spontaneously, global aphasia. EOMI, fully tracks examiner.  PERRL. Antigravity strength, spontaneous movement of all 4 with RUE slightly weaker   ASSESSMENT/PLAN  [name] is a 89 y.o. male with history of A-fib on edoxaban  (switched to edoxaban  as Eliquis  interacted with prostate cancer treatment), CHF, left MCA stroke in 2021 status post thrombectomy, diabetes, hypertension, hyperlipidemia, sleep apnea on CPAP, CKD stage IIIb and prostate cancer on Xtandi  and leuprolide  admitted for acute onset right  facial droop, right-sided weakness and altered mental status.  He was found to have left MCA M1 occlusion and was taken to interventional radiology for successful mechanical thrombectomy.  He remained intubated after the procedure.  Extubated 6/9.  NIH on Admission 26  Stroke:  left MCA infarct with left M1 occlusion s/p IR with TICI3, etiology likely A-fib on DOAC in the setting of recent cardioversion Code Stroke CT head stable acute cytotoxic edema in left MCA territory ASPECTS 7 CTA head & neck recurrent left MCA M1 occlusion, left VA moderate stenosis at origin, right VA occlusion Status post IR with TICI3 Follow-up CT Evolving left MCA territory infarct with mild edema and sulcal  effacement. No midline shift. Foci of hyperattenuation in the  posterior left temporal lobe suggestive of developing petechial hemorrhage. Hyperdense appearance of the distal left M1 segment and left M2  MRI Evolving acute to early subacute ischemic nonhemorrhagic left MCA infarct. Pan CT showed no advance malignancy  2D Echo EF 45 to 50% LDL 32 HgbA1c 8.7 VTE prophylaxis - Edoxaban  Edoxaban  prior to admission, continue resumed Edoxaban  Therapy recommendations: CIR but changed to SNF given lack of home support Disposition: SNF pending family choice  Hx of Stroke/TIA 08/2019 admitted for left BG/CR and caudate infarct.  CTA head and neck left M1 occlusion, right MCA occlusion.  CTP 0/98 cc.  S/p IR with TICI3.  MRI post IR showed left M1 patent.  EF 60 to 65%.  LDL 27, A1c 7.8.  Discharged on aspirin  325 and Lipitor 10.  Recommend discussed with patient cardiologist regarding DOAC.  Atrial fibrillation Was on pradaxa , then changed to eliquis  - ? Due to rectal bleeding ? AC was held during 06/2019 [facility] admission due to rectal bleeding and high-risk of recurrent bleeding.  CHADS-VASC score of 5 at that time, with 7.2% risk of stroke per year, but need to weigh risk of stroke versus risk of recurrent GI bleeding with Eliquis . At the time of discharge, a mutual decision was made with patient, family, and medical team to not restart anticoagulation for fear of recurrence of life-threatening bleeding Stroke in 08/2019 as above, on discharge patient and family reluctant to start DOAC, therefore they were recommended to discuss with his cardiologist regarding DOAC as outpatient. Was put back on eliquis  but then changed to edoxaban  as Eliquis  has interaction with prostate cancer medications. Current home meds including amiodarone  200 mg daily, verapamil  180 mg daily, edoxaban  30 mg daily Cardiac cath and cardioversion 2 days prior to admission Still in afib RVR with pacing, continue telemetry Continue home amiodarone  and verapamil   Amio drip-->transitioned to PO  6/10 Continue Edoxaban   Not Watchman device candidate at this time  Hypertension Home meds: Verapamil , on hold Stable Long-term BP goal normotensive  Hyperlipidemia Home meds: Atorvastatin  10 mg daily, continue LDL 32, goal < 70 High intensity statin not indicated as LDL below goal Continue statin at discharge  Diabetes type II poorly controlled Home meds:  Farxiga  10 mg daily HgbA1c 8.7, goal < 7.0 CBGs SSI Recommend close follow-up with PCP for better DM control  Dysphagia Patient has post-stroke dysphagia SLP following Failed bedside swallow evaluation MBS passed 6/11 on DYS1 and thin liquids  Other Stroke Risk Factors Obesity, Body mass index is 31.27 kg/m., BMI >/= 30 associated with increased stroke risk, recommend weight loss, diet and exercise as appropriate  Obstructive sleep apnea, on CPAP at home CHF  Other Active Problems Acute Cystitis Urinalysis with large leukocytes  CTAP with no  evidence of hemorrhage, but there is increase bladder wall prominence concerning for cystitis.  Continue Ceftriaxone , started 6/15 Prostate cancer-plan to resume home Xtandi , non-formulary, family has not brought in History of rectal bleeding CKD 3B, creatinine 2.26--1.82-1.33-1.47-1.51--1.48-1.43-1.52  Hospital day # 11  Patient seen by NP with MD, MD to edit note as needed.   Jonette Nestle DNP, ACNPC-AG  [facility]  I have personally obtained history,examined this patient, reviewed notes, independently viewed imaging studies, participated in medical decision making and plan of care.ROS completed by me personally and pertinent positives fully documented  I have made any additions or clarifications directly to the above note. Agree with note above.  Patient is medically stable to be transferred to skilled nursing facility when bed available.  No family at the bedside.  Ardella Beaver, MD Medical Director [facility] Pager: [phone] 07/09/2023  11:30 AM

## 2023-07-10 ENCOUNTER — Ambulatory Visit: Admitting: Nurse Practitioner

## 2023-07-10 ENCOUNTER — Telehealth: Payer: Self-pay

## 2023-07-10 DIAGNOSIS — I63512 Cerebral infarction due to unspecified occlusion or stenosis of left middle cerebral artery: Secondary | ICD-10-CM | POA: Diagnosis not present

## 2023-07-10 DIAGNOSIS — I69391 Dysphagia following cerebral infarction: Secondary | ICD-10-CM | POA: Diagnosis not present

## 2023-07-10 DIAGNOSIS — I4891 Unspecified atrial fibrillation: Secondary | ICD-10-CM | POA: Diagnosis not present

## 2023-07-10 DIAGNOSIS — I130 Hypertensive heart and chronic kidney disease with heart failure and stage 1 through stage 4 chronic kidney disease, or unspecified chronic kidney disease: Secondary | ICD-10-CM | POA: Diagnosis not present

## 2023-07-10 LAB — GLUCOSE, CAPILLARY
Glucose-Capillary: 175 mg/dL — ABNORMAL HIGH (ref 70–99)
Glucose-Capillary: 160 mg/dL — ABNORMAL HIGH (ref 70–99)
Glucose-Capillary: 258 mg/dL — ABNORMAL HIGH (ref 70–99)
Glucose-Capillary: 178 mg/dL — ABNORMAL HIGH (ref 70–99)
Glucose-Capillary: 212 mg/dL — ABNORMAL HIGH (ref 70–99)

## 2023-07-10 NOTE — Progress Notes (Signed)
 Physical Therapy Treatment Patient Details Name: [name] MRN: [id number] DOB: 1933/01/27 Today's Date: 07/10/2023   History of Present Illness 89 y/o male presented to [facility] ED on 06/28/23 with facial droop and R sided weakness. NIH 25 and imaging showed left MCA infarct with M1 occlusion. Transferred to [facility] and s/p INR L CFA approach. EEG suggestive of severe diffuse encephalopathy likely related to dedation.  Intubated 6/7 -6/9. Recent admission at [facility] with  monomorphic V. Tach s/p cardioversion. PMH: permanent Afib, sick sinus syndrome s/p PPM, HFpEF, CKD stage IIIb, HTN, T2DM, stage IVb metastatic prostate cancer, DVT    PT Comments  Pt received in supine and agreeable to session. Pt demonstrates decreased awareness and R inattention requiring min A and cues throughout session. Pt able to tolerate increased gait distance with intermittent min A due to drifting and decreased awareness. Pt demonstrates difficulty with dual tasking during ambulation and is unable to answer questions or complete other tasks, such as walking through a diamond on the floor or search for his room. Pt continues to benefit from PT services to progress toward functional mobility goals.    If plan is discharge home, recommend the following: Two people to help with walking and/or transfers;Assistance with cooking/housework;Assist for transportation;Supervision due to cognitive status;Help with stairs or ramp for entrance   Can travel by private vehicle     No  Equipment Recommendations  Wheelchair cushion (measurements PT);Wheelchair (measurements PT);BSC/3in1;Rolling walker (2 wheels)    Recommendations for Other Services       Precautions / Restrictions Precautions Precautions: Fall Recall of Precautions/Restrictions: Impaired Restrictions Weight Bearing Restrictions Per Provider Order: No     Mobility  Bed Mobility Overal bed mobility: Needs Assistance Bed Mobility: Supine to Sit     Supine to  sit: Supervision, HOB elevated     General bed mobility comments: increased time    Transfers Overall transfer level: Needs assistance Equipment used: Rolling walker (2 wheels) Transfers: Sit to/from Stand Sit to Stand: Contact guard assist           General transfer comment: from EOB with CGA for safety and cues for hand placement    Ambulation/Gait Ambulation/Gait assistance: Min assist Gait Distance (Feet): 150 Feet Assistive device: Rolling walker (2 wheels) Gait Pattern/deviations: Step-through pattern, Decreased stride length, Trunk flexed, Drifts right/left, Narrow base of support Gait velocity: decreased     General Gait Details: R inattention and drift requiring cues for obstacle negotiation and min A for RW management.  Pt demonstrates difficulty dual tasking and is unable to follow commands during ambulation   Stairs             Wheelchair Mobility     Tilt Bed    Modified Rankin (Stroke Patients Only) Modified Rankin (Stroke Patients Only) Pre-Morbid Rankin Score: No significant disability Modified Rankin: Moderately severe disability     Balance Overall balance assessment: Needs assistance Sitting-balance support: Feet supported Sitting balance-Leahy Scale: Good     Standing balance support: Bilateral upper extremity supported, During functional activity Standing balance-Leahy Scale: Poor Standing balance comment: with RW support                            Communication Communication Communication: Impaired Factors Affecting Communication: Difficulty expressing self;Reduced clarity of speech  Cognition Arousal: Alert Behavior During Therapy: WFL for tasks assessed/performed   PT - Cognitive impairments: Difficult to assess Difficult to assess due to: Impaired communication  Following commands: Impaired Following commands impaired: Follows one step commands inconsistently, Follows one step  commands with increased time    Cueing Cueing Techniques: Verbal cues, Tactile cues, Gestural cues  Exercises      General Comments        Pertinent Vitals/Pain Pain Assessment Pain Assessment: Faces Faces Pain Scale: No hurt Pain Intervention(s): Monitored during session     PT Goals (current goals can now be found in the care plan section) Acute Rehab PT Goals Patient Stated Goal: unable to state PT Goal Formulation: With patient/family Time For Goal Achievement: 07/15/23 Progress towards PT goals: Progressing toward goals    Frequency    Min 2X/week       AM-PAC PT 6 Clicks Mobility   Outcome Measure  Help needed turning from your back to your side while in a flat bed without using bedrails?: A Little Help needed moving from lying on your back to sitting on the side of a flat bed without using bedrails?: A Little Help needed moving to and from a bed to a chair (including a wheelchair)?: A Little Help needed standing up from a chair using your arms (e.g., wheelchair or bedside chair)?: A Little Help needed to walk in hospital room?: A Little Help needed climbing 3-5 steps with a railing? : Total 6 Click Score: 16    End of Session Equipment Utilized During Treatment: Gait belt Activity Tolerance: Patient tolerated treatment well Patient left: in chair;with call bell/phone within reach;with chair alarm set;with nursing/sitter in room Nurse Communication: Mobility status PT Visit Diagnosis: Unsteadiness on feet (R26.81);Muscle weakness (generalized) (M62.81);Difficulty in walking, not elsewhere classified (R26.2);Hemiplegia and hemiparesis Hemiplegia - Right/Left: Right Hemiplegia - dominant/non-dominant: Dominant Hemiplegia - caused by: Cerebral infarction     Time: 0901-0919 PT Time Calculation (min) (ACUTE ONLY): 18 min  Charges:    $Gait Training: 8-22 mins PT General Charges $$ ACUTE PT VISIT: 1 Visit                     Michaelle Adolphus,  PTA Acute Rehabilitation Services Secure Chat Preferred  [phone]    Michaelle Adolphus 07/10/2023, 10:31 AM

## 2023-07-10 NOTE — TOC Progression Note (Addendum)
 Transition of Care [facility]) - Progression Note    Patient Details  Name: [name] MRN: [id number] Date of Birth: 1933-04-09  Transition of Care [facility]) CM/SW Contact  Tandy Fam, [place] Phone Number: 07/10/2023, 11:07 AM  Clinical Narrative:   CSW updated by RN and MD that patient's bloody urine has improved, ready for SNF placement. CSW confirmed that [facility] has bed available, and requested CMA to start insurance authorization. CSW to follow.  UPDATE: CSW spoke with daughter, [name], to provide update and answer questions. CSW to follow.    Expected Discharge Plan: Skilled Nursing Facility Barriers to Discharge: Continued Medical Work up, English as a second language teacher  Expected Discharge Plan and Services       Living arrangements for the past 2 months: Single Family Home                                       Social Determinants of Health (SDOH) Interventions SDOH Screenings   Food Insecurity: No Food Insecurity (06/23/2023)  Housing: Low Risk  (06/23/2023)  Transportation Needs: No Transportation Needs (06/23/2023)  Utilities: Not At Risk (06/23/2023)  Alcohol Screen: Low Risk  (10/26/2021)  Depression (PHQ2-9): Low Risk  (12/26/2022)  Financial Resource Strain: Low Risk  (10/29/2019)  Recent Concern: Financial Resource Strain - Medium Risk (09/08/2019)  Physical Activity: Inactive (10/29/2019)  Social Connections: Moderately Integrated (06/23/2023)  Stress: No Stress Concern Present (10/29/2019)  Recent Concern: Stress - Stress Concern Present (09/08/2019)  Tobacco Use: Medium Risk (06/28/2023)    Readmission Risk Interventions    07/01/2023    4:26 PM 09/11/2022    1:23 PM 12/26/2020   12:08 PM  Readmission Risk Prevention Plan  Transportation Screening Complete Complete Complete  PCP Werner Specialist Appt within 3-5 Days   Complete  [facility] Consult   Complete  Social Work Consult for Recovery Care Planning/Counseling   Complete  Palliative Care  Screening   Not Applicable  Medication Review Oceanographer) Complete Referral to Pharmacy   PCP Werner Specialist appointment within 3-5 days of discharge Complete Complete   [facility] Consult Complete Complete   SW Recovery Care/Counseling Consult Complete Complete   Palliative Care Screening Not Applicable Not Applicable   Skilled Nursing Facility Not Applicable Not Applicable

## 2023-07-10 NOTE — Telephone Encounter (Signed)
 We received a Ozempic  from Patient assistance program pt is in hospital we holding until he discharge

## 2023-07-10 NOTE — Plan of Care (Signed)
  Problem: Education: Goal: Knowledge of disease or condition will improve Outcome: Progressing Goal: Knowledge of secondary prevention will improve (MUST DOCUMENT ALL) Outcome: Progressing Goal: Knowledge of patient specific risk factors will improve (DELETE if not current risk factor) Outcome: Progressing   Problem: Ischemic Stroke/TIA Tissue Perfusion: Goal: Complications of ischemic stroke/TIA will be minimized Outcome: Progressing   Problem: Coping: Goal: Will verbalize positive feelings about self Outcome: Progressing Goal: Will identify appropriate support needs Outcome: Progressing   Problem: Health Behavior/Discharge Planning: Goal: Ability to manage health-related needs will improve Outcome: Progressing Goal: Goals will be collaboratively established with patient/family Outcome: Progressing   Problem: Self-Care: Goal: Ability to participate in self-care as condition permits will improve Outcome: Progressing Goal: Verbalization of feelings and concerns over difficulty with self-care will improve Outcome: Progressing Goal: Ability to communicate needs accurately will improve Outcome: Progressing   Problem: Nutrition: Goal: Risk of aspiration will decrease Outcome: Progressing Goal: Dietary intake will improve Outcome: Progressing   Problem: Activity: Goal: Ability to tolerate increased activity will improve Outcome: Progressing   Problem: Respiratory: Goal: Ability to maintain a clear airway and adequate ventilation will improve Outcome: Progressing   Problem: Role Relationship: Goal: Method of communication will improve Outcome: Progressing   Problem: Education: Goal: Knowledge of General Education information will improve Description: Including pain rating scale, medication(s)/side effects and non-pharmacologic comfort measures Outcome: Progressing   Problem: Health Behavior/Discharge Planning: Goal: Ability to manage health-related needs will  improve Outcome: Progressing   Problem: Clinical Measurements: Goal: Ability to maintain clinical measurements within normal limits will improve Outcome: Progressing Goal: Will remain free from infection Outcome: Progressing Goal: Diagnostic test results will improve Outcome: Progressing Goal: Respiratory complications will improve Outcome: Progressing Goal: Cardiovascular complication will be avoided Outcome: Progressing   Problem: Activity: Goal: Risk for activity intolerance will decrease Outcome: Progressing   Problem: Nutrition: Goal: Adequate nutrition will be maintained Outcome: Progressing   Problem: Coping: Goal: Level of anxiety will decrease Outcome: Progressing   Problem: Elimination: Goal: Will not experience complications related to bowel motility Outcome: Progressing Goal: Will not experience complications related to urinary retention Outcome: Progressing   Problem: Pain Managment: Goal: General experience of comfort will improve and/or be controlled Outcome: Progressing   Problem: Safety: Goal: Ability to remain free from injury will improve Outcome: Progressing   Problem: Skin Integrity: Goal: Risk for impaired skin integrity will decrease Outcome: Progressing   Problem: Education: Goal: Understanding of CV disease, CV risk reduction, and recovery process will improve Outcome: Progressing Goal: Individualized Educational Video(s) Outcome: Progressing   Problem: Activity: Goal: Ability to return to baseline activity level will improve Outcome: Progressing   Problem: Cardiovascular: Goal: Ability to achieve and maintain adequate cardiovascular perfusion will improve Outcome: Progressing Goal: Vascular access site(s) Level 0-1 will be maintained Outcome: Progressing

## 2023-07-10 NOTE — Progress Notes (Addendum)
 STROKE TEAM PROGRESS NOTE    SIGNIFICANT HOSPITAL EVENTS 6/7-patient admitted, taken to interventional radiology with successful revascularization of left MCA M1 segment 6/10: MRI brain with evolving acute to early subacute ischemic left MCA infarct 6/11: Passed MBS. Transferred out of ICU.   INTERIM HISTORY/SUBJECTIVE  Some scant blood in urine was seen yesterday, this has improved. Will recheck daily labs tomorrow AM.  Discharge planning for SNF, pending insurance approval.  CBC    Component Value Date/Time   WBC 8.6 07/06/2023 0713   RBC 4.49 07/06/2023 0713   HGB 13.9 07/06/2023 0713   HGB 15.2 04/29/2023 1259   HGB 14.6 06/11/2019 0953   HCT 43.1 07/06/2023 0713   HCT 46.0 06/11/2019 0953   PLT 398 07/06/2023 0713   PLT 189 04/29/2023 1259   PLT 154 06/11/2019 0953   MCV 96.0 07/06/2023 0713   MCV 94 06/11/2019 0953   MCV 92 10/13/2013 0415   MCH 31.0 07/06/2023 0713   MCHC 32.3 07/06/2023 0713   RDW 15.2 07/06/2023 0713   RDW 13.3 06/11/2019 0953   RDW 15.3 (H) 10/13/2013 0415   LYMPHSABS 0.3 (L) 06/29/2023 0458   LYMPHSABS 2.4 10/13/2013 0415   MONOABS 1.0 06/29/2023 0458   MONOABS 0.8 10/13/2013 0415   EOSABS 0.1 06/29/2023 0458   EOSABS 0.2 10/13/2013 0415   BASOSABS 0.0 06/29/2023 0458   BASOSABS 0.0 10/13/2013 0415    BMET    Component Value Date/Time   NA 141 07/06/2023 0713   NA 144 10/11/2021 0936   NA 138 10/13/2013 0415   K 4.0 07/06/2023 0713   K 4.0 10/13/2013 0415   CL 105 07/06/2023 0713   CL 106 10/13/2013 0415   CO2 23 07/06/2023 0713   CO2 27 10/13/2013 0415   GLUCOSE 165 (H) 07/06/2023 0713   GLUCOSE 97 10/13/2013 0415   BUN 27 (H) 07/06/2023 0713   BUN 46 (H) 10/11/2021 0936   BUN 22 (H) 10/13/2013 0415   CREATININE 1.40 (H) 07/06/2023 0713   CREATININE 2.17 (H) 04/29/2023 1259   CREATININE 1.65 (H) 10/13/2013 0415   CALCIUM  9.0 07/06/2023 0713   CALCIUM  8.2 (L) 10/13/2013 0415   EGFR 22 (L) 10/11/2021 0936   GFRNONAA 48 (L)  07/06/2023 0713   GFRNONAA 29 (L) 04/29/2023 1259   GFRNONAA 39 (L) 10/13/2013 0415    IMAGING past 24 hours No results found.   Vitals:   07/09/23 2042 07/10/23 0036 07/10/23 0411 07/10/23 0726  BP: (!) 155/81 138/77 (!) 142/83 132/83  Pulse: 99 (!) 113 (!) 111 100  Resp: 18 18 17    Temp: 98.8 F (37.1 C) 98.7 F (37.1 C) 99.1 F (37.3 C) (!) 97.4 F (36.3 C)  TempSrc: Oral Oral Oral Oral  SpO2: 96% 92% 92% 94%  Weight:      Height:        PHYSICAL EXAM General:Elderly patient in no acute distress Psych: Calm and cooperative CV: Irregular, Irregular Respiratory: Regular, unlabored breathing, room air  NEURO  Eyes open spontaneously, global aphasia.Patient attempts to speak, makes mostly guttural incomprehensible sounds.  EOMI, fully tracks examiner.  PERRL. Antigravity strength, spontaneous movement of all 4 with RUE slightly weaker   ASSESSMENT/PLAN  [name] is a 89 y.o. male with history of A-fib on edoxaban  (switched to edoxaban  as Eliquis  interacted with prostate cancer treatment), CHF, left MCA stroke in 2021 status post thrombectomy, diabetes, hypertension, hyperlipidemia, sleep apnea on CPAP, CKD stage IIIb and prostate cancer on Xtandi   and leuprolide  admitted for acute onset right facial droop, right-sided weakness and altered mental status.  He was found to have left MCA M1 occlusion and was taken to interventional radiology for successful mechanical thrombectomy.  He remained intubated after the procedure.  Extubated 6/9.  NIH on Admission 26  Stroke:  left MCA infarct with left M1 occlusion s/p IR with TICI3, etiology likely A-fib on DOAC in the setting of recent cardioversion Code Stroke CT head stable acute cytotoxic edema in left MCA territory ASPECTS 7 CTA head & neck recurrent left MCA M1 occlusion, left VA moderate stenosis at origin, right VA occlusion Status post IR with TICI3 Follow-up CT Evolving left MCA territory infarct with mild  edema and sulcal  effacement. No midline shift. Foci of hyperattenuation in the posterior left temporal lobe suggestive of developing petechial hemorrhage. Hyperdense appearance of the distal left M1 segment and left M2  MRI Evolving acute to early subacute ischemic nonhemorrhagic left MCA infarct. Pan CT showed no advanced malignancy  2D Echo EF 45 to 50% LDL 32 HgbA1c 8.7 VTE prophylaxis - Edoxaban  Edoxaban  prior to admission, continue resumed Edoxaban  Therapy recommendations: CIR but changed to SNF given lack of home support Disposition: SNF pending family choice  Hx of Stroke/TIA 08/2019 admitted for left BG/CR and caudate infarct.  CTA head and neck left M1 occlusion, right MCA occlusion.  CTP 0/98 cc.  S/p IR with TICI3.  MRI post IR showed left M1 patent.  EF 60 to 65%.  LDL 27, A1c 7.8.  Discharged on aspirin  325 and Lipitor 10.  Recommend discussed with patient cardiologist regarding DOAC.  Atrial fibrillation Was on pradaxa , then changed to eliquis  - ? Due to rectal bleeding ? AC was held during 06/2019 [facility] admission due to rectal bleeding and high-risk of recurrent bleeding.  CHADS-VASC score of 5 at that time, with 7.2% risk of stroke per year, but need to weigh risk of stroke versus risk of recurrent GI bleeding with Eliquis . At the time of discharge, a mutual decision was made with patient, family, and medical team to not restart anticoagulation for fear of recurrence of life-threatening bleeding Stroke in 08/2019 as above, on discharge patient and family reluctant to start DOAC, therefore they were recommended to discuss with his cardiologist regarding DOAC as outpatient. Was put back on eliquis  but then changed to edoxaban  as Eliquis  has interaction with prostate cancer medications. Current home meds including amiodarone  200 mg daily, verapamil  180 mg daily, edoxaban  30 mg daily Cardiac cath and cardioversion 2 days prior to admission Still in afib RVR with pacing, continue  telemetry Continue home amiodarone  and verapamil   Amio drip-->transitioned to PO 6/10 Continue Edoxaban   Not Watchman device candidate at this time  Hypertension Home meds: Verapamil , on hold Stable Long-term BP goal normotensive  Hyperlipidemia Home meds: Atorvastatin  10 mg daily, continue LDL 32, goal < 70 High intensity statin not indicated as LDL below goal Continue statin at discharge  Diabetes type II poorly controlled Home meds:  Farxiga  10 mg daily HgbA1c 8.7, goal < 7.0 CBGs SSI Recommend close follow-up with PCP for better DM control  Dysphagia Patient has post-stroke dysphagia SLP following Failed bedside swallow evaluation MBS passed 6/11 on DYS1 and thin liquids  Other Stroke Risk Factors Obesity, Body mass index is 31.27 kg/m., BMI >/= 30 associated with increased stroke risk, recommend weight loss, diet and exercise as appropriate  Obstructive sleep apnea, on CPAP at home CHF  Other Active Problems Acute Cystitis Urinalysis  with large leukocytes  CTAP with no evidence of hemorrhage, but there is increase bladder wall prominence concerning for cystitis.  Continue Ceftriaxone , started 6/15 Prostate cancer-plan to resume home Xtandi , non-formulary, family has not brought in History of rectal bleeding CKD 3B, creatinine 2.26--1.82-1.33-1.47-1.51--1.48-1.43-1.52-1.40  Hospital day # 12  Patient seen by NP with MD, MD to edit note as needed.  Audrene Lease, DNP [facility] Please use AMION for contact information & EPIC for messaging.  I have personally obtained history,examined this patient, reviewed notes, independently viewed imaging studies, participated in medical decision making and plan of care.ROS completed by me personally and pertinent positives fully documented  I have made any additions or clarifications directly to the above note. Agree with note above.  Patient medically stable to be transferred to skilled nursing facility for  rehab when bed available.  Ardella Beaver, MD Medical Director [facility] Pager: [phone] 07/10/2023 1:18 PM

## 2023-07-10 NOTE — Progress Notes (Signed)
 Speech Language Pathology Treatment: Cognitive-Linguistic  Patient Details Name: [name] MRN: [id number] DOB: 06-Dec-1933 Today's Date: 07/10/2023 Time: 1191-4782 SLP Time Calculation (min) (ACUTE ONLY): 28 min  Assessment / Plan / Recommendation Clinical Impression  Pt up in chair, alert. Enjoyed looking through [employer]. Used line drawing and real life objects to elicit language from pt, pt with prosodic accuracy and occasional intelligible naming its a baseball. Pt handled objects and SLP made simple comments or recast pts utterances. Pt delighted with handling unusual objects like a screwdriver you can fix stuff with that  but did not mimic use of object with cues and could not pair objects with cards depicting objects. Will continue efforts.    HPI HPI: [name] is a 89 y.o. male with admitted for acute onset right facial droop, right-sided weakness and altered mental status.  He was found to have left MCA M1 occlusion and was taken to interventional radiology for successful mechanical thrombectomy.Intubated 6/7-6/9. history of A-fib on edoxaban  (switched to edoxaban  as Eliquis  interacted with prostate cancer treatment), CHF, left MCA stroke in 2021 status post thrombectomy, diabetes, hypertension, hyperlipidemia, sleep apnea on CPAP, CKD stage IIIb and prostate cancer on Xtandi  and leuprolide       SLP Plan  Continue with current plan of care          Recommendations  Diet recommendations: Dysphagia 1 (puree);Thin liquid Liquids provided via: Straw Medication Administration: Crushed with puree Supervision: Full supervision/cueing for compensatory strategies                              Continue with current plan of care     Skyleen Bentley, Hardin Leys  07/10/2023, 2:26 PM

## 2023-07-10 NOTE — Plan of Care (Signed)
  Problem: Education: Goal: Knowledge of disease or condition will improve 07/10/2023 1759 by Dorsie Gaunt, RN Outcome: Progressing 07/10/2023 0812 by Dorsie Gaunt, RN Outcome: Progressing Goal: Knowledge of secondary prevention will improve (MUST DOCUMENT ALL) 07/10/2023 1759 by Dorsie Gaunt, RN Outcome: Progressing 07/10/2023 0812 by Dorsie Gaunt, RN Outcome: Progressing Goal: Knowledge of patient specific risk factors will improve (DELETE if not current risk factor) 07/10/2023 1759 by Dorsie Gaunt, RN Outcome: Progressing 07/10/2023 0812 by Dorsie Gaunt, RN Outcome: Progressing   Problem: Ischemic Stroke/TIA Tissue Perfusion: Goal: Complications of ischemic stroke/TIA will be minimized 07/10/2023 1759 by Dorsie Gaunt, RN Outcome: Progressing 07/10/2023 0812 by Dorsie Gaunt, RN Outcome: Progressing   Problem: Coping: Goal: Will verbalize positive feelings about self 07/10/2023 1759 by Dorsie Gaunt, RN Outcome: Progressing 07/10/2023 0812 by Dorsie Gaunt, RN Outcome: Progressing Goal: Will identify appropriate support needs 07/10/2023 1759 by Dorsie Gaunt, RN Outcome: Progressing 07/10/2023 0812 by Dorsie Gaunt, RN Outcome: Progressing

## 2023-07-11 ENCOUNTER — Other Ambulatory Visit: Payer: Self-pay

## 2023-07-11 DIAGNOSIS — I69351 Hemiplegia and hemiparesis following cerebral infarction affecting right dominant side: Secondary | ICD-10-CM | POA: Diagnosis not present

## 2023-07-11 DIAGNOSIS — Z87891 Personal history of nicotine dependence: Secondary | ICD-10-CM | POA: Diagnosis not present

## 2023-07-11 DIAGNOSIS — Z9889 Other specified postprocedural states: Secondary | ICD-10-CM | POA: Diagnosis not present

## 2023-07-11 DIAGNOSIS — N184 Chronic kidney disease, stage 4 (severe): Secondary | ICD-10-CM | POA: Diagnosis not present

## 2023-07-11 DIAGNOSIS — J449 Chronic obstructive pulmonary disease, unspecified: Secondary | ICD-10-CM | POA: Diagnosis not present

## 2023-07-11 DIAGNOSIS — I69398 Other sequelae of cerebral infarction: Secondary | ICD-10-CM | POA: Diagnosis not present

## 2023-07-11 DIAGNOSIS — I130 Hypertensive heart and chronic kidney disease with heart failure and stage 1 through stage 4 chronic kidney disease, or unspecified chronic kidney disease: Secondary | ICD-10-CM | POA: Diagnosis not present

## 2023-07-11 DIAGNOSIS — Z950 Presence of cardiac pacemaker: Secondary | ICD-10-CM | POA: Diagnosis not present

## 2023-07-11 DIAGNOSIS — E1122 Type 2 diabetes mellitus with diabetic chronic kidney disease: Secondary | ICD-10-CM | POA: Diagnosis not present

## 2023-07-11 DIAGNOSIS — I69391 Dysphagia following cerebral infarction: Secondary | ICD-10-CM | POA: Diagnosis not present

## 2023-07-11 DIAGNOSIS — G459 Transient cerebral ischemic attack, unspecified: Secondary | ICD-10-CM | POA: Diagnosis not present

## 2023-07-11 DIAGNOSIS — I4821 Permanent atrial fibrillation: Secondary | ICD-10-CM | POA: Diagnosis not present

## 2023-07-11 DIAGNOSIS — I69320 Aphasia following cerebral infarction: Secondary | ICD-10-CM | POA: Diagnosis not present

## 2023-07-11 DIAGNOSIS — I63512 Cerebral infarction due to unspecified occlusion or stenosis of left middle cerebral artery: Secondary | ICD-10-CM | POA: Diagnosis not present

## 2023-07-11 DIAGNOSIS — Z7901 Long term (current) use of anticoagulants: Secondary | ICD-10-CM | POA: Diagnosis not present

## 2023-07-11 DIAGNOSIS — N3001 Acute cystitis with hematuria: Secondary | ICD-10-CM | POA: Diagnosis not present

## 2023-07-11 DIAGNOSIS — Z743 Need for continuous supervision: Secondary | ICD-10-CM | POA: Diagnosis not present

## 2023-07-11 DIAGNOSIS — R1310 Dysphagia, unspecified: Secondary | ICD-10-CM | POA: Diagnosis not present

## 2023-07-11 DIAGNOSIS — I509 Heart failure, unspecified: Secondary | ICD-10-CM | POA: Diagnosis not present

## 2023-07-11 DIAGNOSIS — I63412 Cerebral infarction due to embolism of left middle cerebral artery: Secondary | ICD-10-CM | POA: Diagnosis not present

## 2023-07-11 DIAGNOSIS — N3000 Acute cystitis without hematuria: Secondary | ICD-10-CM | POA: Diagnosis not present

## 2023-07-11 DIAGNOSIS — G8190 Hemiplegia, unspecified affecting unspecified side: Secondary | ICD-10-CM | POA: Diagnosis not present

## 2023-07-11 DIAGNOSIS — I472 Ventricular tachycardia, unspecified: Secondary | ICD-10-CM | POA: Diagnosis not present

## 2023-07-11 DIAGNOSIS — D509 Iron deficiency anemia, unspecified: Secondary | ICD-10-CM | POA: Diagnosis not present

## 2023-07-11 DIAGNOSIS — E785 Hyperlipidemia, unspecified: Secondary | ICD-10-CM | POA: Diagnosis not present

## 2023-07-11 DIAGNOSIS — I6389 Other cerebral infarction: Secondary | ICD-10-CM | POA: Diagnosis not present

## 2023-07-11 DIAGNOSIS — R1312 Dysphagia, oropharyngeal phase: Secondary | ICD-10-CM | POA: Diagnosis not present

## 2023-07-11 DIAGNOSIS — R319 Hematuria, unspecified: Secondary | ICD-10-CM | POA: Diagnosis not present

## 2023-07-11 DIAGNOSIS — I4891 Unspecified atrial fibrillation: Secondary | ICD-10-CM | POA: Diagnosis not present

## 2023-07-11 DIAGNOSIS — R531 Weakness: Secondary | ICD-10-CM | POA: Diagnosis not present

## 2023-07-11 DIAGNOSIS — E1165 Type 2 diabetes mellitus with hyperglycemia: Secondary | ICD-10-CM | POA: Diagnosis not present

## 2023-07-11 DIAGNOSIS — Z7401 Bed confinement status: Secondary | ICD-10-CM | POA: Diagnosis not present

## 2023-07-11 DIAGNOSIS — N1832 Chronic kidney disease, stage 3b: Secondary | ICD-10-CM | POA: Diagnosis not present

## 2023-07-11 DIAGNOSIS — G4733 Obstructive sleep apnea (adult) (pediatric): Secondary | ICD-10-CM | POA: Diagnosis not present

## 2023-07-11 DIAGNOSIS — N183 Chronic kidney disease, stage 3 unspecified: Secondary | ICD-10-CM | POA: Diagnosis not present

## 2023-07-11 DIAGNOSIS — M6281 Muscle weakness (generalized): Secondary | ICD-10-CM | POA: Diagnosis not present

## 2023-07-11 LAB — GLUCOSE, CAPILLARY
Glucose-Capillary: 168 mg/dL — ABNORMAL HIGH (ref 70–99)
Glucose-Capillary: 159 mg/dL — ABNORMAL HIGH (ref 70–99)
Glucose-Capillary: 158 mg/dL — ABNORMAL HIGH (ref 70–99)
Glucose-Capillary: 180 mg/dL — ABNORMAL HIGH (ref 70–99)
Glucose-Capillary: 176 mg/dL — ABNORMAL HIGH (ref 70–99)

## 2023-07-11 LAB — BASIC METABOLIC PANEL WITH GFR
Anion gap: 9 (ref 5–15)
BUN: 35 mg/dL — ABNORMAL HIGH (ref 8–23)
CO2: 23 mmol/L (ref 22–32)
Calcium: 8.8 mg/dL — ABNORMAL LOW (ref 8.9–10.3)
Chloride: 105 mmol/L (ref 98–111)
Creatinine, Ser: 1.43 mg/dL — ABNORMAL HIGH (ref 0.61–1.24)
GFR, Estimated: 47 mL/min — ABNORMAL LOW (ref >60)
Glucose, Bld: 166 mg/dL — ABNORMAL HIGH (ref 70–99)
Potassium: 4.9 mmol/L (ref 3.5–5.1)
Sodium: 137 mmol/L (ref 135–145)

## 2023-07-11 LAB — CBC
HCT: 41.8 % (ref 39.0–52.0)
Hemoglobin: 12.9 g/dL — ABNORMAL LOW (ref 13.0–17.0)
MCH: 30.3 pg (ref 26.0–34.0)
MCHC: 30.9 g/dL (ref 30.0–36.0)
MCV: 98.1 fL (ref 80.0–100.0)
Platelets: 376 10*3/uL (ref 150–400)
RBC: 4.26 MIL/uL (ref 4.22–5.81)
RDW: 15.3 % (ref 11.5–15.5)
WBC: 6 10*3/uL (ref 4.0–10.5)
nRBC: 0 % (ref 0.0–0.2)

## 2023-07-11 MED ORDER — ATORVASTATIN CALCIUM 10 MG PO TABS: 10.0000 mg | ORAL_TABLET | Freq: Every day | ORAL | 0 refills | Status: DC

## 2023-07-11 MED ORDER — AMIODARONE HCL 200 MG PO TABS
200.0000 mg | ORAL_TABLET | Freq: Every day | ORAL | 0 refills | Status: DC
Start: 2023-07-12 — End: 2023-08-07

## 2023-07-11 NOTE — Discharge Summary (Addendum)
 Stroke Discharge Summary  Patient ID: [name]   MRN: [id number]      DOB: [birth year]  Date of Admission: 06/28/2023 Date of Discharge: 07/11/2023  Attending Physician:  Stroke, Md, MD, Stroke MD Patient's PCP:  Laurence Pons, NP  DISCHARGE DIAGNOSIS: Stroke: left MCA infarct with left M1 occlusion s/p mechanical thrombectomy with TICI3 revascularization, etiology likely A-fib on DOAC in the setting of recent cardioversion  Principal Problem:   Acute ischemic left MCA stroke [facility]) Active Problems:   Middle cerebral artery embolism, left Global aphasia   Allergies as of 07/11/2023   No Known Allergies      Medication List     STOP taking these medications    aspirin  EC 81 MG tablet   verapamil  180 MG CR tablet Commonly known as: CALAN -SR       TAKE these medications    albuterol  108 (90 Base) MCG/ACT inhaler Commonly known as: VENTOLIN  HFA Inhale 2 puffs into the lungs every 4 (four) hours as needed for wheezing or shortness of breath.   amiodarone  200 MG tablet Commonly known as: PACERONE  Take 1 tablet (200 mg total) by mouth daily. Start taking on: July 12, 2023 What changed: See the new instructions.   atorvastatin  10 MG tablet Commonly known as: LIPITOR Take 1 tablet (10 mg total) by mouth daily. Start taking on: July 12, 2023 What changed:  how much to take how to take this when to take this additional instructions   cyanocobalamin  1000 MCG tablet Commonly known as: VITAMIN B12 Take 1 tablet (1,000 mcg total) by mouth daily.   dapagliflozin  propanediol 10 MG Tabs tablet Commonly known as: FARXIGA  Take 1 tablet (10 mg total) by mouth daily.   edoxaban  30 MG Tabs tablet Commonly known as: SAVAYSA  Take 30 mg by mouth daily.   FeroSul 325 (65 FE) MG tablet Generic drug: ferrous sulfate  Take 1 tablet by mouth daily.   furosemide  20 MG tablet Commonly known as: LASIX  Take 1 tablet (20 mg total) by mouth daily.   nitroGLYCERIN  0.4  MG SL tablet Commonly known as: NITROSTAT  Place 1 tablet (0.4 mg total) under the tongue every 5 (five) minutes as needed for chest pain.   Ozempic  (0.25 or 0.5 MG/DOSE) 2 MG/3ML Sopn Generic drug: Semaglutide (0.25 or 0.5MG /DOS) Inject 0.5 mg into the skin once a week.   pantoprazole  40 MG tablet Commonly known as: Protonix  Take 1 tablet (40 mg total) by mouth daily.   polyethylene glycol powder 17 GM/SCOOP powder Commonly known as: GLYCOLAX /MIRALAX  Take 17 g by mouth daily as needed for mild constipation. Mix as directed.   Spiriva  Respimat 2.5 MCG/ACT Aers Generic drug: Tiotropium Bromide  Monohydrate Inhale 2 puffs into the lungs daily.   VITAMIN D-3 PO Take 1 capsule by mouth daily.   Xtandi  40 MG tablet Generic drug: enzalutamide  Take 4 tablets (160 mg total) by mouth daily.        LABORATORY STUDIES CBC    Component Value Date/Time   WBC 6.0 07/11/2023 0605   RBC 4.26 07/11/2023 0605   HGB 12.9 (L) 07/11/2023 0605   HGB 15.2 04/29/2023 1259   HGB 14.6 06/11/2019 0953   HCT 41.8 07/11/2023 0605   HCT 46.0 06/11/2019 0953   PLT 376 07/11/2023 0605   PLT 189 04/29/2023 1259   PLT 154 06/11/2019 0953   MCV 98.1 07/11/2023 0605   MCV 94 06/11/2019 0953   MCV 92 10/13/2013 0415   MCH 30.3 07/11/2023 4098  MCHC 30.9 07/11/2023 0605   RDW 15.3 07/11/2023 0605   RDW 13.3 06/11/2019 0953   RDW 15.3 (H) 10/13/2013 0415   LYMPHSABS 0.3 (L) 06/29/2023 0458   LYMPHSABS 2.4 10/13/2013 0415   MONOABS 1.0 06/29/2023 0458   MONOABS 0.8 10/13/2013 0415   EOSABS 0.1 06/29/2023 0458   EOSABS 0.2 10/13/2013 0415   BASOSABS 0.0 06/29/2023 0458   BASOSABS 0.0 10/13/2013 0415   CMP    Component Value Date/Time   NA 137 07/11/2023 0605   NA 144 10/11/2021 0936   NA 138 10/13/2013 0415   K 4.9 07/11/2023 0605   K 4.0 10/13/2013 0415   CL 105 07/11/2023 0605   CL 106 10/13/2013 0415   CO2 23 07/11/2023 0605   CO2 27 10/13/2013 0415   GLUCOSE 166 (H) 07/11/2023 0605    GLUCOSE 97 10/13/2013 0415   BUN 35 (H) 07/11/2023 0605   BUN 46 (H) 10/11/2021 0936   BUN 22 (H) 10/13/2013 0415   CREATININE 1.43 (H) 07/11/2023 0605   CREATININE 2.17 (H) 04/29/2023 1259   CREATININE 1.65 (H) 10/13/2013 0415   CALCIUM  8.8 (L) 07/11/2023 0605   CALCIUM  8.2 (L) 10/13/2013 0415   PROT 6.5 06/29/2023 0458   PROT 7.3 06/11/2019 0953   PROT 7.4 10/12/2013 0732   ALBUMIN  2.3 (L) 06/29/2023 0458   ALBUMIN  4.4 06/11/2019 0953   ALBUMIN  3.0 (L) 10/12/2013 0732   AST 20 06/29/2023 0458   AST 15 04/29/2023 1259   ALT 17 06/29/2023 0458   ALT 10 04/29/2023 1259   ALT 29 10/12/2013 0732   ALKPHOS 80 06/29/2023 0458   ALKPHOS 128 (H) 10/12/2013 0732   BILITOT 1.2 06/29/2023 0458   BILITOT 0.9 04/29/2023 1259   GFRNONAA 47 (L) 07/11/2023 0605   GFRNONAA 29 (L) 04/29/2023 1259   GFRNONAA 39 (L) 10/13/2013 0415   GFRAA 28 (L) 10/12/2019 1035   GFRAA 45 (L) 10/13/2013 0415   COAGS Lab Results  Component Value Date   INR 1.4 (H) 06/29/2023   INR 1.9 (H) 06/28/2023   INR 1.3 (H) 09/09/2022   Lipid Panel    Component Value Date/Time   CHOL 83 06/29/2023 0459   CHOL 97 (L) 06/11/2019 0953   CHOL 140 10/12/2013 0732   TRIG 142 06/29/2023 0459   TRIG 139 10/12/2013 0732   HDL 23 (L) 06/29/2023 0459   HDL 47 06/11/2019 0953   HDL 43 10/12/2013 0732   CHOLHDL 3.6 06/29/2023 0459   VLDL 28 06/29/2023 0459   VLDL 28 10/12/2013 0732   LDLCALC 32 06/29/2023 0459   LDLCALC 34 06/11/2019 0953   LDLCALC 69 10/12/2013 0732   HgbA1C  Lab Results  Component Value Date   HGBA1C 6.9 (H) 06/29/2023   Urinalysis    Component Value Date/Time   COLORURINE AMBER (A) 07/05/2023 2006   APPEARANCEUR HAZY (A) 07/05/2023 2006   APPEARANCEUR Clear 12/20/2021 1615   LABSPEC 1.020 07/05/2023 2006   LABSPEC 1.015 10/12/2013 1445   PHURINE 7.0 07/05/2023 2006   GLUCOSEU 50 (A) 07/05/2023 2006   GLUCOSEU 50 mg/dL 60/45/4098 1191   HGBUR LARGE (A) 07/05/2023 2006   BILIRUBINUR  NEGATIVE 07/05/2023 2006   BILIRUBINUR Negative 12/20/2021 1615   BILIRUBINUR Negative 10/12/2013 1445   KETONESUR NEGATIVE 07/05/2023 2006   PROTEINUR 100 (A) 07/05/2023 2006   UROBILINOGEN 0.2 06/29/2019 1556   NITRITE NEGATIVE 07/05/2023 2006   LEUKOCYTESUR LARGE (A) 07/05/2023 2006   LEUKOCYTESUR Negative 10/12/2013 1445   Urine  Drug Screen     Component Value Date/Time   LABOPIA NEGATIVE 06/11/2013 2253   COCAINSCRNUR NEGATIVE 06/11/2013 2253   LABBENZ NEGATIVE 06/11/2013 2253   AMPHETMU NEGATIVE 06/11/2013 2253   THCU NEGATIVE 06/11/2013 2253   LABBARB NEGATIVE 06/11/2013 2253    Alcohol Level    Component Value Date/Time   [facility] <15 06/28/2023 0913     SIGNIFICANT DIAGNOSTIC STUDIES CT ABDOMEN PELVIS W CONTRAST Result Date: 07/06/2023 CLINICAL DATA:  Epic notes indicate patient had a percutaneous arterial thrombectomy/infusion 06/28/2023. There is swelling in the groin and reason to suspect retroperitoneal hemorrhage as well. The patient was noted with bilateral groin hematomas on a scan from 3 days ago, which was done to assess for metastatic disease from his prostate cancer. EXAM: CT ABDOMEN AND PELVIS WITH CONTRAST TECHNIQUE: Multidetector CT imaging of the abdomen and pelvis was performed using the standard protocol following bolus administration of intravenous contrast. RADIATION DOSE REDUCTION: This exam was performed according to the departmental dose-optimization program which includes automated exposure control, adjustment of the mA and/or kV according to patient size and/or use of iterative reconstruction technique. CONTRAST:  75mL OMNIPAQUE  IOHEXOL  350 MG/ML SOLN COMPARISON:  CT scan chest, abdomen and pelvis with IV contrast 07/03/2023, CT abdomen and pelvis with no contrast 07/23/2022, and CT abdomen and pelvis with IV contrast 10/16/2021. FINDINGS: Lower chest: Again noted moderate panchamber cardiomegaly with biatrial chamber predominance, prominent central pulmonary  veins. There is spray artifact from a right ventricular leadless pacemaker. No pericardial effusion. Small pleural effusions and overlying lower lobe atelectasis are unchanged. There is diffuse bronchial thickening and occasional posterior basilar lower lobe subsegmental branch bronchial impactions. Hepatobiliary: Once again, an exophytic round lesion in the outer periphery of hepatic segment 5/6 measures 4.6 x 4 cm on 3:29, 75 Hounsfield units but is not significantly changed dating back to 2021 and is probably a complex cyst. Other scattered hepatic cysts are similarly unchanged, largest is anteriorly in segment 3 again measuring 3.6 cm and 13 Hounsfield units. Gallbladder and bile ducts are unremarkable apart from contrast in the gallbladder which was likely vicariously excreted from the contrast administered 3 days ago. Pancreas: Partially atrophic and otherwise unremarkable. Spleen: No abnormality. Adrenals/Urinary Tract: No adrenal mass. Both kidneys again show patchy cortical scarring and multiple nonobstructive caliceal stones. There is no obstructing stone or hydronephrosis. There is no renal mass enhancement. The bladder is contracted but even so appears increasingly thickened and with increased perivesical stranding concerning for cystitis. Stomach/Bowel: Unremarkable contracted stomach. Normal caliber small bowel. An appendix is not seen in this patient. There is contrast in the colon. Diverticulosis of left colon. There is no evidence of wall thickening or dilatation. Vascular/Lymphatic: Moderate to heavy aortoiliac atherosclerosis. 3.3 cm fusiform infrarenal AAA. Asymmetric aneurysmal prominence right common iliac artery 2.1 cm. No appreciable adenopathy. Reproductive: Remote radical prostatectomy. No recurrent mass is seen in the prostate bed. Both testicles are in the scrotal sac. Numerous pelvic phleboliths. Other: There continues to be minimal presacral ascites, unchanged since 3 days ago. There is  mild body wall edema in the upper thighs laterally. There is a small umbilical fat hernia.  No incarcerated hernia. There are subcutaneous bilateral groin hematomas, also seen previously. The hematoma on the right measures 6.6 x 2.3 cm on 3:95. The hematoma on the left measures 6.9 by 3.3 cm on 3:92. Both are not significantly changed in size since 3 days ago. Within the abdomen and pelvis there is no free hemorrhage or hematoma,  no free air or focal inflammatory process apart from the perivesical stranding. Musculoskeletal: Degenerative change lumbar spine. Bridging osteophytes both anterior SI joints. Osteopenia. IMPRESSION: 1. No evidence of retroperitoneal hemorrhage or hematoma. 2. Bilateral groin hematomas, not significantly changed in size since 3 days ago. 3. Minimal presacral ascites, unchanged since 3 days ago. 4. Remote radical prostatectomy without evidence of recurrent mass in the prostate bed. 5. Increasing bladder wall prominence and perivesical stranding concerning for cystitis. 6. Small pleural effusions and overlying lower lobe atelectasis, unchanged. 7. Diffuse bronchial thickening and occasional posterior basilar lower lobe subsegmental branch bronchial impactions. 8. Aortic and branch vessel atherosclerosis. 9. 3.3 cm fusiform infrarenal AAA. 2.1 cm aneurysmal prominence right common iliac artery. Recommend surveillance ultrasound in 3 years. Reference: Journal of Vascular Surgery 67.1 (2018): 2-77. J Am Coll Radiol [phone]. 10. Stable hepatic cysts. 11. Osteopenia and degenerative change. 12. Umbilical fat hernia. Aortic Atherosclerosis (ICD10-I70.0). Electronically Signed   By: Denman Fischer M.D.   On: 07/06/2023 01:26   DG Abd 1 View Result Date: 07/05/2023 EXAM: 1 VIEW XRAY OF THE ABDOMEN SUPINE 07/05/2023 08:44:00 PM COMPARISON: CT abdomen/pelvis dated 09/27/2023. CLINICAL HISTORY: Samuel Crock hematuria. FINDINGS: BOWEL: Nonobstructive bowel gas pattern. Residual contrast in the  colon/rectum with suspected sigmoid diverticulosis. PERITONEUM AND SOFT TISSUES: No abnormal calcifications. BONES: No acute osseous abnormality. Status post prostatectomy. IMPRESSION: 1. No acute findings. 2. Status post prostatectomy. Electronically signed by: Zadie Herter MD 07/05/2023 08:56 PM EDT RP Workstation: [id number]   CT CHEST ABDOMEN PELVIS W CONTRAST Result Date: 07/03/2023 CLINICAL DATA:  History of prostate carcinoma, evaluate for metastatic disease EXAM: CT CHEST, ABDOMEN, AND PELVIS WITH CONTRAST TECHNIQUE: Multidetector CT imaging of the chest, abdomen and pelvis was performed following the standard protocol during bolus administration of intravenous contrast. RADIATION DOSE REDUCTION: This exam was performed according to the departmental dose-optimization program which includes automated exposure control, adjustment of the mA and/or kV according to patient size and/or use of iterative reconstruction technique. CONTRAST:  75mL OMNIPAQUE  IOHEXOL  350 MG/ML SOLN COMPARISON:  07/23/2022 FINDINGS: CT CHEST FINDINGS Cardiovascular: Atherosclerotic calcifications of the aorta are noted. No aneurysmal dilatation or dissection is seen. Heart is mildly enlarged in size. The pulmonary artery shows no evidence of pulmonary emboli. Mild coronary calcifications are seen. Mediastinum/Nodes: Thoracic inlet is within normal limits. No hilar or mediastinal adenopathy is noted. The esophagus as visualized is within normal limits. Lungs/Pleura: Small bilateral pleural effusions and lower lobe atelectatic changes are seen. No focal confluent infiltrate is seen. No parenchymal nodules are noted. Musculoskeletal: No acute rib abnormality is noted. No sclerotic lesions are identified to suggest metastatic disease. CT ABDOMEN PELVIS FINDINGS Hepatobiliary: Gallbladder is within normal limits. Liver demonstrates a cystic lesion anteriorly stable from the prior exam. Additionally a isodense liver lesion on the right  is noted stable in appearance from the prior exam. This measures up to 4.6 cm and has been stable for multiple previous exams back at least to 2021. Pancreas: Unremarkable. No pancreatic ductal dilatation or surrounding inflammatory changes. Spleen: Normal in size without focal abnormality. Adrenals/Urinary Tract: Adrenal glands are within normal limits. Kidneys demonstrate multiple small nonobstructing stones. The largest of these lies in the left kidney measuring 4 mm. No obstructive changes are seen. No ureteral stones are noted. The bladder is decompressed. Stomach/Bowel: Scattered diverticular change of the colon is noted. No obstructive or inflammatory changes of the colon are noted. The appendix is not well visualized and may have been surgically removed. Small bowel and  stomach are within normal limits. Vascular/Lymphatic: Stable dilatation of the aorta to 3.2 cm is noted. No significant lymphadenopathy is noted. Reproductive: Prostate has been surgically removed. Other: No abdominal wall hernia or abnormality. No abdominopelvic ascites. Musculoskeletal: Bony structures show no definitive sclerotic metastatic disease. IMPRESSION: No definitive bony metastatic disease is noted. Bone scan may be more sensitive. Stable dilatation of the abdominal aorta to 3.2 cm. Recommend follow-up ultrasound every 3 years. (Ref.: J Vasc Surg. 2018; 67:2-77 and J Am Coll Radiol 2013;10(10):789-794.) Bilateral small effusions and lower lobe atelectatic changes. Multiple nonobstructing renal calculi. Diverticulosis without diverticulitis. Electronically Signed   By: Violeta Grey M.D.   On: 07/03/2023 21:40   DG Swallowing Func-Speech Pathology Result Date: 07/02/2023 Table formatting from the original result was not included. Modified Barium Swallow Study Patient Details Name: LYSLE YERO MRN: [id number] Date of Birth: May 23, 1933 Today's Date: 07/02/2023 HPI/PMH: HPI: Mr. LOUIS IVERY is a 89 y.o. male with admitted  for acute onset right facial droop, right-sided weakness and altered mental status.  He was found to have left MCA M1 occlusion and was taken to interventional radiology for successful mechanical thrombectomy.Intubated 6/7-6/9. history of A-fib on edoxaban  (switched to edoxaban  as Eliquis  interacted with prostate cancer treatment), CHF, left MCA stroke in 2021 status post thrombectomy, diabetes, hypertension, hyperlipidemia, sleep apnea on CPAP, CKD stage IIIb and prostate cancer on Xtandi  and leuprolide  Clinical Impression: Pt demonstrates a primary oral dysphagia with right CN VII and XII weakness. Oral impairments lead to premature spillage, contribute to delayed initiation of swallow and aspiration of oral residue that spills posteriorally post swallow. DIGEST score of 2. Pt has aspiration of thin liquids before the swallow with sensation and good ejection. Oral residue of nectar and honey thick liquids also elicit risk of aspiration post swallow and honey thick liquids resulted in the most singificant aspiration event with hard prolonged coughing from pt. Could not attempt mastication of solids due to coughing and lingual weakness, but if diet is tolerated would attempt minced and moist solids soon. Esophageal sweep also showed distal esophageal stasis with thin liquids. For this pt, though thin liquids are likely to result in coughing, coughing is protective and most easily ejects thin liquids. Recommend pt initiate thin liquids and purees. Oral hygiene, positioning and mobility are important defenses against negative consequences of aspiration. Meds to be crushed in puree.  DIGEST Swallow Severity Rating*             Safety: 2             Efficiency:1             Overall Pharyngeal Swallow Severity: 2 1: mild; 2: moderate; 3: severe; 4: profound *The Dynamic Imaging Grade of Swallowing Toxicity is standardized for the head and neck cancer population, however, demonstrates promising clinical applications  across populations to standardize the clinical rating of pharyngeal swallow safety and severity. Factors that may increase risk of adverse event in presence of aspiration Roderick Civatte & Jessy Morocco 2021): No data recorded Recommendations/Plan: Swallowing Evaluation Recommendations Swallowing Evaluation Recommendations Recommendations: PO diet PO Diet Recommendation: Dysphagia 1 (Pureed); Thin liquids (Level 0) Liquid Administration via: Straw Medication Administration: Crushed with puree Supervision: Staff to assist with self-feeding Swallowing strategies  : Slow rate; Small bites/sips Postural changes: Position pt fully upright for meals Oral care recommendations: Oral care BID (2x/day) Treatment Plan Treatment Plan Treatment recommendations: Therapy as outlined in treatment plan below Follow-up recommendations: Acute inpatient rehab (3 hours/day) Functional status  assessment: Patient has had a recent decline in their functional status and demonstrates the ability to make significant improvements in function in a reasonable and predictable amount of time. Treatment frequency: Min 2x/week Treatment duration: 2 weeks Interventions: Aspiration precaution training; Diet toleration management by SLP; Trials of upgraded texture/liquids; Compensatory techniques Recommendations Recommendations for follow up therapy are one component of a multi-disciplinary discharge planning process, led by the attending physician.  Recommendations may be updated based on patient status, additional functional criteria and insurance authorization. Assessment: Orofacial Exam: Orofacial Exam Oral Cavity: Oral Hygiene: WFL Oral Cavity - Dentition: Missing dentition Orofacial Anatomy: WFL Oral Motor/Sensory Function: Suspected cranial nerve impairment CN V - Trigeminal: WFL CN VII - Facial: Right motor impairment CN XII - Hypoglossal: Right motor impairment Anatomy: Anatomy: WFL Boluses Administered: Boluses Administered Boluses Administered: Thin  liquids (Level 0); Mildly thick liquids (Level 2, nectar thick); Moderately thick liquids (Level 3, honey thick); Puree  Oral Impairment Domain: Oral Impairment Domain Lip Closure: Escape beyond mid-chin Tongue control during bolus hold: Posterior escape of greater than half of bolus Bolus preparation/mastication: Slow prolonged chewing/mashing with complete recollection Bolus transport/lingual motion: Repetitive/disorganized tongue motion Oral residue: Residue collection on oral structures Location of oral residue : Floor of mouth; Lateral sulci; Tongue Initiation of pharyngeal swallow : Pyriform sinuses  Pharyngeal Impairment Domain: Pharyngeal Impairment Domain Soft palate elevation: No bolus between soft palate (SP)/pharyngeal wall (PW) Laryngeal elevation: Complete superior movement of thyroid  cartilage with complete approximation of arytenoids to epiglottic petiole Anterior hyoid excursion: Complete anterior movement Epiglottic movement: Complete inversion Laryngeal vestibule closure: Complete, no air/contrast in laryngeal vestibule Pharyngeal stripping wave : Present - complete Pharyngoesophageal segment opening: Complete distension and complete duration, no obstruction of flow Tongue base retraction: No contrast between tongue base and posterior pharyngeal wall (PPW) Pharyngeal residue: Complete pharyngeal clearance  Esophageal Impairment Domain: Esophageal Impairment Domain Esophageal clearance upright position: Esophageal retention Pill: No data recorded Penetration/Aspiration Scale Score: Penetration/Aspiration Scale Score 1.  Material does not enter airway: Thin liquids (Level 0); Puree 3.  Material enters airway, remains ABOVE vocal cords and not ejected out: Mildly thick liquids (Level 2, nectar thick) 4.  Material enters airway, CONTACTS cords then ejected out: Mildly thick liquids (Level 2, nectar thick) 6.  Material enters airway, passes BELOW cords then ejected out: Thin liquids (Level 0);  Moderately thick liquids (Level 3, honey thick) Compensatory Strategies: No data recorded  General Information: Caregiver present: No  Diet Prior to this Study: NPO   Temperature : Normal   Respiratory Status: WFL   Supplemental O2: None (Room air)   History of Recent Intubation: Yes  Behavior/Cognition: Alert; Cooperative; Requires cueing; Doesn't follow directions Self-Feeding Abilities: Dependent for feeding; Needs assist with self-feeding Baseline vocal quality/speech: Normal Volitional Cough: Unable to elicit Volitional Swallow: Unable to elicit Exam Limitations: Other (comment) (unable to follow commands due to aphasia) Goal Planning: Prognosis for improved oropharyngeal function: Good No data recorded No data recorded No data recorded Consulted and agree with results and recommendations: Pt unable/family or caregiver not available Pain: Pain Assessment Pain Assessment: Faces Faces Pain Scale: 0 Facial Expression: 0 Body Movements: 0 Muscle Tension: 0 Compliance with ventilator (intubated pts.): N/A Vocalization (extubated pts.): 0 CPOT Total: 0 End of Session: Start Time:SLP Start Time (ACUTE ONLY): 0940 Stop Time: SLP Stop Time (ACUTE ONLY): 1000 Time Calculation:SLP Time Calculation (min) (ACUTE ONLY): 20 min Charges: SLP Evaluations $ SLP Speech Visit: 1 Visit SLP Evaluations $BSS Swallow: 1 Procedure $MBS  Swallow: 1 Procedure $ SLP EVAL LANGUAGE/SOUND PRODUCTION: 1 Procedure $Swallowing Treatment: 1 Procedure $Speech Treatment for Individual: 1 Procedure SLP visit diagnosis: SLP Visit Diagnosis: Dysphagia, unspecified (R13.10) Past Medical History: Past Medical History: Diagnosis Date  Acute deep vein thrombosis (DVT) of femoral vein of left lower extremity (HCC)   Atrial fibrillation (HCC)   CHF (congestive heart failure) (HCC)   Chronic kidney disease   Diabetes (HCC)   Hearing loss   Hyperlipidemia   Liver cyst   Nephrolithiasis   OSA (obstructive sleep apnea)   Prostate cancer (HCC)   SSS (sick sinus  syndrome) (HCC)   Stroke (HCC)   Valvular heart disease  Past Surgical History: Past Surgical History: Procedure Laterality Date  CATARACT EXTRACTION    COLONOSCOPY WITH PROPOFOL  N/A 07/02/2019  Procedure: COLONOSCOPY WITH PROPOFOL ;  Surgeon: Selena Daily, MD;  Location: [facility] ENDOSCOPY;  Service: Gastroenterology;  Laterality: N/A;  ESOPHAGOGASTRODUODENOSCOPY (EGD) WITH PROPOFOL  N/A 03/19/2022  Procedure: ESOPHAGOGASTRODUODENOSCOPY (EGD) WITH PROPOFOL ;  Surgeon: Luke Salaam, MD;  Location[facility] ENDOSCOPY;  Service: Gastroenterology;  Laterality: N/A;  IR CT HEAD LTD  08/27/2019  IR CT HEAD LTD  06/28/2023  IR PERCUTANEOUS ART THROMBECTOMY/INFUSION INTRACRANIAL INC DIAG ANGIO  08/27/2019     IR PERCUTANEOUS ART THROMBECTOMY/INFUSION INTRACRANIAL INC DIAG ANGIO  08/27/2019  IR PERCUTANEOUS ART THROMBECTOMY/INFUSION INTRACRANIAL INC DIAG ANGIO  06/28/2023  LEFT HEART CATH AND CORONARY ANGIOGRAPHY N/A 06/24/2023  Procedure: LEFT HEART CATH AND CORONARY ANGIOGRAPHY;  Surgeon: Percival Brace, MD;  Location: [facility] INVASIVE CV LAB;  Service: Cardiovascular;  Laterality: N/A;  LOWER EXTREMITY ANGIOGRAPHY Left 12/25/2020  Procedure: Lower Extremity Angiography;  Surgeon: Celso College, MD;  Location: [facility] INVASIVE CV LAB;  Service: Cardiovascular;  Laterality: Left;  PACEMAKER LEADLESS INSERTION N/A 12/19/2020  Procedure: PACEMAKER LEADLESS INSERTION;  Surgeon: Percival Brace, MD;  Location: [facility] INVASIVE CV LAB;  Service: Cardiovascular;  Laterality: N/A;  PROSTATE CANCER    PROSTATE SURGERY    RADIOLOGY WITH ANESTHESIA N/A 08/27/2019  Procedure: IR WITH ANESTHESIA;  Surgeon: Luellen Sages, MD;  Location: [facility] OR;  Service: Radiology;  Laterality: N/A;  RADIOLOGY WITH ANESTHESIA N/A 06/28/2023  Procedure: RADIOLOGY WITH ANESTHESIA;  Surgeon: Radiologist, Medication, MD;  Location: [facility] OR;  Service: Radiology;  Laterality: N/A; DeBlois, Hardin Leys 07/02/2023, 10:56 AM  IR PERCUTANEOUS ART THROMBECTOMY/INFUSION  INTRACRANIAL INC DIAG ANGIO Result Date: 07/01/2023 INDICATION: New onset of left gaze deviation, right-sided hemiplegia and right sided neglect. Occluded left middle cerebral artery on CT angiogram of the head and neck. EXAM: 1. EMERGENT LARGE VESSEL OCCLUSION THROMBOLYSIS anterior CIRCULATION) COMPARISON:  CT angiogram of the head and neck of 06/29/2023. MEDICATIONS: No antibiotic was administered within 1 hour of the procedure. ANESTHESIA/SEDATION: General anesthesia. CONTRAST:  Omnipaque  300 approximately 30 mL. FLUOROSCOPY TIME:  Fluoroscopy Time: 12 minutes 18 seconds (1010 mGy). COMPLICATIONS: None immediate. TECHNIQUE: Following a full explanation of the procedure along with the potential associated complications, an informed witnessed consent was obtained. The risks of intracranial hemorrhage of 10%, worsening neurological deficit, ventilator dependency, death and inability to revascularize were all reviewed in detail with the patient's family. The patient was then put under general anesthesia by the Department of Anesthesiology at [facility]. The right groin was prepped and draped in the usual sterile fashion. Thereafter using modified Seldinger technique, transfemoral access into the right common femoral artery was obtained without difficulty. Over an 0.035 inch guidewire an 8 French 25 cm Pinnacle sheath was inserted. Through this, and also over an 0.035  inch guidewire a combination of an 088 100 cm Zoom support catheter with a 125 cm Berenstein support catheter was advanced to the aortic arch region, and selectively positioned in the left common carotid artery and the distal left internal carotid artery. FINDINGS: The left common carotid arteriogram demonstrates wide patency of the left external carotid artery and its major branches. The left internal carotid artery at the bulb to the cranial skull base demonstrates wide patency. The petrous, the cavernous and the supraclinoid left ICA are  widely patent. The left anterior cerebral artery opacifies into the capillary and venous phases. There is a small infundibulum at the origin of the left posterior communicating artery. The left middle cerebral artery demonstrates complete occlusion just distal to its origin. PROCEDURE: Through the 088 100 cm Zoom aspiration catheter in the proximal cavernous left ICA, an 068 Zoom aspiration catheter was advanced over an 024 Aristotle micro guidewire with a moderate J configuration to the proximal left middle cerebral artery. The micro guidewire was then advanced without difficulty into an inferior branch of the left MCA followed by advancement of the 068 aspiration catheter into the mid to the distal left M1 segment. The guidewire was removed. Constant aspiration was then applied at the hub of the 068 aspiration catheter via a pump for 4 minutes. With aspiration at the hub of the support catheter with a 20 mL syringe, the 068 aspiration catheter was removed. A gentle control arteriogram performed through the 068 support catheter in the left internal carotid artery demonstrated complete revascularization of the left middle cerebral artery distribution achieving a TICI 3 revascularization. The left anterior cerebral artery remained widely patent. A final control arteriogram performed through the 088 Zoom catheter in the distal left common carotid artery demonstrated left internal carotid artery extra cranially and intracranially to be widely patent. The left middle cerebral artery and the left anterior cerebral artery continued to demonstrate a TICI 3 revascularization. An 8 French Angio-Seal closure device provided hemostasis at the right groin puncture site. Distal pulses remained patent bilaterally unchanged. A flat panel CT of the brain demonstrated no evidence of intracranial hemorrhage. Patient was left intubated due to his preprocedural medical condition, and to protect his airway. He was then transferred to  the neuro ICU for post revascularization care. IMPRESSION.: IMPRESSION. Status post complete revascularization of occluded left middle cerebral M1 segment with 1 pass with an 068 contact aspiration catheter achieving a TICI 3 revascularization. PLAN: As per referring stroke Neurology team. Electronically Signed   By: Luellen Sages M.D.   On: 07/01/2023 08:16   IR CT Head Ltd Result Date: 07/01/2023 INDICATION: New onset of left gaze deviation, right-sided hemiplegia and right sided neglect. Occluded left middle cerebral artery on CT angiogram of the head and neck. EXAM: 1. EMERGENT LARGE VESSEL OCCLUSION THROMBOLYSIS anterior CIRCULATION) COMPARISON:  CT angiogram of the head and neck of 06/29/2023. MEDICATIONS: No antibiotic was administered within 1 hour of the procedure. ANESTHESIA/SEDATION: General anesthesia. CONTRAST:  Omnipaque  300 approximately 30 mL. FLUOROSCOPY TIME:  Fluoroscopy Time: 12 minutes 18 seconds (1010 mGy). COMPLICATIONS: None immediate. TECHNIQUE: Following a full explanation of the procedure along with the potential associated complications, an informed witnessed consent was obtained. The risks of intracranial hemorrhage of 10%, worsening neurological deficit, ventilator dependency, death and inability to revascularize were all reviewed in detail with the patient's family. The patient was then put under general anesthesia by the Department of Anesthesiology at [facility]. The right groin was prepped and  draped in the usual sterile fashion. Thereafter using modified Seldinger technique, transfemoral access into the right common femoral artery was obtained without difficulty. Over an 0.035 inch guidewire an 8 French 25 cm Pinnacle sheath was inserted. Through this, and also over an 0.035 inch guidewire a combination of an 088 100 cm Zoom support catheter with a 125 cm Berenstein support catheter was advanced to the aortic arch region, and selectively positioned in the left  common carotid artery and the distal left internal carotid artery. FINDINGS: The left common carotid arteriogram demonstrates wide patency of the left external carotid artery and its major branches. The left internal carotid artery at the bulb to the cranial skull base demonstrates wide patency. The petrous, the cavernous and the supraclinoid left ICA are widely patent. The left anterior cerebral artery opacifies into the capillary and venous phases. There is a small infundibulum at the origin of the left posterior communicating artery. The left middle cerebral artery demonstrates complete occlusion just distal to its origin. PROCEDURE: Through the 088 100 cm Zoom aspiration catheter in the proximal cavernous left ICA, an 068 Zoom aspiration catheter was advanced over an 024 Aristotle micro guidewire with a moderate J configuration to the proximal left middle cerebral artery. The micro guidewire was then advanced without difficulty into an inferior branch of the left MCA followed by advancement of the 068 aspiration catheter into the mid to the distal left M1 segment. The guidewire was removed. Constant aspiration was then applied at the hub of the 068 aspiration catheter via a pump for 4 minutes. With aspiration at the hub of the support catheter with a 20 mL syringe, the 068 aspiration catheter was removed. A gentle control arteriogram performed through the 068 support catheter in the left internal carotid artery demonstrated complete revascularization of the left middle cerebral artery distribution achieving a TICI 3 revascularization. The left anterior cerebral artery remained widely patent. A final control arteriogram performed through the 088 Zoom catheter in the distal left common carotid artery demonstrated left internal carotid artery extra cranially and intracranially to be widely patent. The left middle cerebral artery and the left anterior cerebral artery continued to demonstrate a TICI 3  revascularization. An 8 French Angio-Seal closure device provided hemostasis at the right groin puncture site. Distal pulses remained patent bilaterally unchanged. A flat panel CT of the brain demonstrated no evidence of intracranial hemorrhage. Patient was left intubated due to his preprocedural medical condition, and to protect his airway. He was then transferred to the neuro ICU for post revascularization care. IMPRESSION.: IMPRESSION. Status post complete revascularization of occluded left middle cerebral M1 segment with 1 pass with an 068 contact aspiration catheter achieving a TICI 3 revascularization. PLAN: As per referring stroke Neurology team. Electronically Signed   By: Luellen Sages M.D.   On: 07/01/2023 08:16   MR BRAIN WO CONTRAST Result Date: 06/30/2023 CLINICAL DATA:  Follow-up examination for stroke. EXAM: MRI HEAD WITHOUT CONTRAST TECHNIQUE: Multiplanar, multiecho pulse sequences of the brain and surrounding structures were obtained without intravenous contrast. COMPARISON:  Prior study from 06/29/2023 and earlier. FINDINGS: Brain: Examination severely degraded by motion artifact. Cerebral volume within normal limits. Patchy and confluent restricted diffusion involving the left cerebral hemisphere, consistent with an evolving acute to early subacute left MCA distribution infarct. Involvement of the left basal ganglia, left frontal, left temporal, and left parietal lobes. No significant regional mass effect. No visible significant associated blood products on this motion degraded exam. No other evidence for acute  or subacute infarct. Gray-white matter differentiation otherwise grossly maintained. No other visible acute or significant chronic intracranial blood products. No mass lesion, midline shift or mass effect. No hydrocephalus or extra-axial fluid collection. Vascular: Major intracranial vascular flow voids are grossly maintained at the skull base. Skull and upper cervical spine: Grossly  unremarkable. Sinuses/Orbits: Prior bilateral ocular lens replacement. Paranasal sinuses are grossly clear. No significant mastoid effusion. Other: None. IMPRESSION: 1. Severely motion degraded exam. 2. Evolving acute to early subacute ischemic nonhemorrhagic left MCA distribution infarct. No significant regional mass effect. Electronically Signed   By: Virgia Griffins M.D.   On: 06/30/2023 20:37   DG CHEST PORT 1 VIEW Result Date: 06/29/2023 CLINICAL DATA:  OG tube placement EXAM: PORTABLE CHEST 1 VIEW COMPARISON:  Radiograph 06/28/2023 FINDINGS: Endotracheal tube tip in the intrathoracic trachea 2.3 cm from the carina. The balloon in the upper trachea measures 3.1 cm in diameter. The tip of the enteric tube is below the inferior aspect of the image below the diaphragm. Stable cardiomegaly. Pulmonary vascular congestion. Bibasilar atelectasis and probable small right pleural effusion. No pneumothorax. No displaced rib fractures. IMPRESSION: 1. Endotracheal tube tip in the intrathoracic trachea 2.3 cm from the carina. Question overinflation of the tracheal balloon. The balloon in the upper trachea measures 3.1 cm in diameter. 2. The tip of the enteric tube is below the inferior aspect of the image below the diaphragm. 3. Cardiomegaly with pulmonary vascular congestion. 4. Bibasilar atelectasis and probable small right pleural effusion. Electronically Signed   By: Rozell Cornet M.D.   On: 06/29/2023 19:18   CT HEAD WO CONTRAST ( ) Result Date: 06/29/2023 CLINICAL DATA:  Stroke follow-up, acute left MCA territory infarct. EXAM: CT HEAD WITHOUT CONTRAST TECHNIQUE: Contiguous axial images were obtained from the base of the skull through the vertex without intravenous contrast. RADIATION DOSE REDUCTION: This exam was performed according to the departmental dose-optimization program which includes automated exposure control, adjustment of the mA and/or kV according to patient size and/or use of iterative  reconstruction technique. COMPARISON:  CT head and CTA head and neck 07/28/2023. FINDINGS: Brain: Evolving left MCA territory infarct with decreased attenuation within the left temporal lobe, left frontal operculum, left insular cortex, as well as the left basal ganglia particularly the caudate head and lentiform nucleus. There is mild associated edema and sulcal effacement within this region without midline shift. Few faint areas of hyperattenuation within the posterior temporal lobe suggestive of developing petechial hemorrhage. No parenchymal hematoma. Basilar cisterns are patent. No extra-axial fluid collection. Ventricles: The ventricles are normal. Vascular: Atherosclerotic calcifications at the skull base involving the carotid siphons more so on the right. There is mild density involving the distal aspect of the left M1 segment extending into left M2 branches. Skull: No acute or aggressive finding. Orbits: Bilateral lens replacement. Sinuses: No significant mucosal thickening or air-fluid level in the visualized paranasal sinuses. Other: Trace fluid in the left mastoid air cells. Partially visualized nasoenteric tube coiled within the nasopharynx. IMPRESSION: Evolving left MCA territory infarct with mild edema and sulcal effacement. No midline shift. Foci of hyperattenuation in the posterior left temporal lobe suggestive of developing petechial hemorrhage. No evidence of parenchymal hematoma. Hyperdense appearance of the distal left M1 segment and left M2 branches corresponding to findings of occlusion on CTA. Partially visualized enteric tube in the nasopharynx. Recommend radiographic correlation to evaluate tube positioning. Electronically Signed   By: Denny Flack M.D.   On: 06/29/2023 18:34   EEG adult Result Date:  06/29/2023 [name], MD     06/29/2023  1:41 PM Patient Name: GENIE MIRABAL MRN: [id number] Epilepsy Attending: [name] Referring Physician/Provider: Colon Dear, NP Date: 06/29/2023 Duration: Patient history: 89yo m with left M1 stoke now with facial and left arm twitching. EEG to evaluate for seizure Level of alertness: comatose AEDs during EEG study: Propofol  Technical aspects: This EEG study was done with scalp electrodes positioned according to the 10-20 International system of electrode placement. Electrical activity was reviewed with band pass filter of 1-70Hz , sensitivity of 7 uV/mm, display speed of 61mm/sec with a 60Hz  notched filter applied as appropriate. EEG data were recorded continuously and digitally stored.  Video monitoring was available and reviewed as appropriate. Description: EEG showed continuous generalized 3 to 6 Hz theta-delta slowing. Patient was noted to have episodes of brief left hand movement in response to stimulation of foot or right arm.  Concomitant EEG before, during and after the event did not show any EEG changes suggest seizure. Hyperventilation and photic stimulation were not performed.   ABNORMALITY - Continuous slow, generalized IMPRESSION: This study is suggestive of severe diffuse encephalopathy, nonspecific etiology but likely related to sedation. No seizures or epileptiform discharges were seen throughout the recording. Patient was noted to have episodes of brief left hand movement in response to stimulation of foot or right arm without concomitant EEG change. This was most likely NOT an epileptic event. However, focal motor seizure may not be seen on scalp EEG. Clinical correlation is recommended. [name]   DG Abd 1 View Result Date: 06/28/2023 CLINICAL DATA:  Encounter for feeding tube placement EXAM: ABDOMEN - 1 VIEW COMPARISON:  Abdominal radiographs 01/20/2023 FINDINGS: Enteric tube tip at the gastroesophageal junction and side-port in the lower esophagus. Recommend advancement approximately 10 cm to ensure side port is within the stomach. IMPRESSION: Enteric tube tip at the gastroesophageal junction and side-port  in the lower esophagus. Recommend advancement approximately 10 cm to ensure side port is within the stomach. Electronically Signed   By: Rozell Cornet M.D.   On: 06/28/2023 21:45   Portable Chest x-ray Result Date: 06/28/2023 CLINICAL DATA:  Intubation. EXAM: PORTABLE CHEST 1 VIEW COMPARISON:  Chest radiograph dated 06/23/2023. FINDINGS: Endotracheal tube approximately 4.5 cm above the carina. There is cardiomegaly with vascular congestion. Bibasilar atelectasis. Small right pleural effusion. No pneumothorax. No acute osseous pathology. IMPRESSION: 1. Endotracheal tube above the carina. 2. Cardiomegaly with vascular congestion.  Bibasilar atelectasis. Electronically Signed   By: Angus Bark M.D.   On: 06/28/2023 14:08   CT ANGIO HEAD NECK W WO CM (CODE STROKE) Result Date: 06/28/2023 CLINICAL DATA:  89 year old male code stroke. 2021 left M1 occlusion treated with thrombectomy. Questionable recurrent left MCA cytotoxic edema on plain head CT today. EXAM: CT ANGIOGRAPHY HEAD AND NECK TECHNIQUE: Multidetector CT imaging of the head and neck was performed using the standard protocol during bolus administration of intravenous contrast. Multiplanar CT image reconstructions and MIPs were obtained to evaluate the vascular anatomy. Carotid stenosis measurements (when applicable) are obtained utilizing NASCET criteria, using the distal internal carotid diameter as the denominator. RADIATION DOSE REDUCTION: This exam was performed according to the departmental dose-optimization program which includes automated exposure control, adjustment of the mA and/or kV according to patient size and/or use of iterative reconstruction technique. CONTRAST:  Not specified. COMPARISON:  Head CT at 0917 hours.  Prior CTA 08/27/2019. FINDINGS: CTA NECK Skeleton: Cervical spine degeneration. No acute osseous abnormality  identified. Upper chest: Small layering pleural effusions. Pulmonary arterial dominant contrast timing in the  mediastinum. Other neck: Nonvascular neck soft tissue spaces are within normal limits. Aortic arch: Calcified aortic atherosclerosis. Bovine arch configuration. Right carotid system: Tortuous brachiocephalic artery and proximal right CCA. Patent to the skull base with minimal atherosclerosis. Left carotid system: Bovine left CCA origin with minimal plaque and no stenosis. Soft and calcified plaque at the left ICA origin but patent left ICA to the skull base with no significant stenosis. Vertebral arteries: Chronically occluded proximal right vertebral artery. Reconstitution in the V2 segment seen in 2021 is not apparent although contrast timing is earlier today. Proximal left subclavian artery atherosclerosis without stenosis. Calcified plaque at the left vertebral artery origin with moderate stenosis, remains patent. Left vertebral artery caliber stable since 2021, the vessel is patent to the skull base without additional stenosis. CTA HEAD Posterior circulation: Patent left V4 segment with mild atherosclerosis, no significant stenosis to the vertebrobasilar junction. Faint probably retrograde enhancement of chronically diminutive right V4 segment. Left PICA origin remains patent. Patent basilar artery without stenosis. Patent SCA and PCA origins. Posterior communicating arteries are diminutive or absent. Bilateral PCA branches are stable and within normal limits. Anterior circulation: Suboptimal arterial contrast timing. Both ICA siphons are patent. Right greater than left ICA siphon calcified atherosclerosis. Mild to moderate right ICA anterior genu and supraclinoid stenosis. No significant left ICA siphon stenosis. Patent carotid termini. Patent MCA and ACA origins. A1 segments are stable, the left is mildly dominant and the right is mildly irregular. ACA branch early contrast timing, stable since 2021. Right MCA M1 segment, right MCA bifurcation are patent. Right MCA branches appear stable since 2021. Recurrent  left MCA M1 occlusion 5 mm distal to its origin on series 10 image 12 and series 9, image 18 with no reconstitution on these early phase images. Venous sinuses: Early contrast timing, not evaluated. Anatomic variants: Effectively dominant left vertebral artery, bovine arch. Review of the MIP images confirms the above findings Preliminary report of left MCA M1 ELVO was communicated to Dr. Cleone Dad at 9:32 am on 06/28/2023 by text page via the [facility] messaging system. IMPRESSION: 1. Positive for Recurrent Left MCA M1 Occlusion (5 mm distal to origin). This was communicated to Dr. Cleone Dad at 9:32 am on 06/28/2023 by text page via the [facility] messaging system. 2. Otherwise largely stable CTA since 2021 when allowing for early arterial contrast timing today. Chronically occluded right vertebral artery. Moderate Left Vertebral Artery origin stenosis. Aortic Atherosclerosis (ICD10-I70.0). Electronically Signed   By: Marlise Simpers M.D.   On: 06/28/2023 09:38   CT HEAD CODE STROKE WO CONTRAST Result Date: 06/28/2023 CLINICAL DATA:  Code stroke. 89 year old male. History of 2021 left MCA M1 occlusion treated with thrombectomy. EXAM: CT HEAD WITHOUT CONTRAST TECHNIQUE: Contiguous axial images were obtained from the base of the skull through the vertex without intravenous contrast. RADIATION DOSE REDUCTION: This exam was performed according to the departmental dose-optimization program which includes automated exposure control, adjustment of the mA and/or kV according to patient size and/or use of iterative reconstruction technique. COMPARISON:  Brain MRI 08/28/2019.  Head CT 08/27/2019. FINDINGS: Brain: Chronic dense globus pallidus calcifications. Chronic posterior left basal ganglia ischemia with encephalomalacia. Less motion artifact than on the 2021 head CT. Right hemisphere and posterior fossa gray-white differentiation appears stable since 2021. But on series 3 image 21 there is possible acute cytotoxic edema in the left MCA Tara 4  toward [place] spanning the M4  through M6 segments. No intracranial hemorrhage or mass effect. No ventriculomegaly. Vascular: Calcified atherosclerosis at the skull base. No suspicious intracranial vascular hyperdensity. Skull: Stable and intact.  No acute osseous abnormality identified. Sinuses/Orbits: Visualized paranasal sinuses and mastoids are stable and well aerated. Other: Leftward gaze. Chronic postoperative changes to both globes. Visualized scalp soft tissues are within normal limits. ASPECTS [facility] Stroke Program Early CT Score) Total score (0-10 with 10 being normal): 7 (abnormal M4, M5, and M6) IMPRESSION: 1. Possible Acute cytotoxic edema Left MCA territory, ASPECTS 7. 2. No acute intracranial hemorrhage or mass effect. 3. The above was communicated to Dr. Cleone Dad at 9:29 am on 06/28/2023 by text page via the [facility] messaging system. 4. Chronic left basal ganglia infarct., globus pallidus calcifications, Calcified atherosclerosis at the skull base. Electronically Signed   By: Marlise Simpers M.D.   On: 06/28/2023 09:30   CARDIAC CATHETERIZATION Result Date: 06/24/2023   Ost LAD to Prox LAD lesion is 40% stenosed.   There is mild left ventricular systolic dysfunction.   The left ventricular ejection fraction is 35-45% by visual estimate. 1.  Insignificant coronary artery disease with 40-50% ostial/proximal LAD 2.  Mild reduced left ventricular function with estimate LVEF 40% Recommendations 1.  Medical therapy 2.  Aggressive risk factor modification   ECHOCARDIOGRAM COMPLETE Result Date: 06/24/2023    ECHOCARDIOGRAM REPORT   Patient Name:   MONTI JILEK Date of Exam: 06/24/2023 Medical Rec #:  [id number]         Height:       72.0 in Accession #:    [id number]        Weight:       234.6 lb Date of Birth:  [birth year]          BSA:          2.280 m Patient Age:    89 years          BP:           136/117 mmHg Patient Gender: M                 HR:           70 bpm. Exam Location:  [facility] Procedure: 2D Echo,  Cardiac Doppler and Color Doppler (Both Spectral and Color            Flow Doppler were utilized during procedure). Indications:     Ventricular tachycardia  History:         Patient has prior history of Echocardiogram examinations, most                  recent 07/09/2022. CHF, Stroke, Arrythmias:Atrial Fibrillation;                  Risk Factors:Sleep Apnea.  Sonographer:     Broadus Canes Referring Phys:  [id number] Avi Body Diagnosing Phys: Lida Reeks Alluri IMPRESSIONS  1. Technically difficult study.  2. Left ventricular ejection fraction, by estimation, is 45 to 50%. The left ventricle has mildly decreased function. The left ventricle demonstrates regional wall motion abnormalities (see scoring diagram/findings for description). There is moderate left ventricular hypertrophy. Left ventricular diastolic parameters are indeterminate.  3. Right ventricular systolic function was not well visualized. The right ventricular size is not well visualized.  4. Left atrial size was moderately dilated.  5. The mitral valve is normal in structure. Trivial mitral valve regurgitation.  6. The aortic valve is tricuspid. Aortic valve regurgitation is not visualized. FINDINGS  Left Ventricle: Left ventricular ejection fraction, by estimation, is 45 to 50%. The left ventricle has mildly decreased function. The left ventricle demonstrates regional wall motion abnormalities. The left ventricular internal cavity size was normal in size. There is moderate left ventricular hypertrophy. Left ventricular diastolic parameters are indeterminate.  LV Wall Scoring: The antero-lateral wall, posterior wall, and basal inferior segment are hypokinetic. The entire anterior wall, entire septum, entire apex, and mid and distal inferior wall are normal. Right Ventricle: The right ventricular size is not well visualized. Right vetricular wall thickness was not well visualized. Right ventricular systolic function was not well visualized. Left Atrium:  Left atrial size was moderately dilated. Right Atrium: Right atrial size was not well visualized. Pericardium: There is no evidence of pericardial effusion. Mitral Valve: The mitral valve is normal in structure. Trivial mitral valve regurgitation. Tricuspid Valve: The tricuspid valve is not well visualized. Tricuspid valve regurgitation is not demonstrated. Aortic Valve: The aortic valve is tricuspid. Aortic valve regurgitation is not visualized. Aortic valve mean gradient measures 1.5 mmHg. Aortic valve peak gradient measures 2.2 mmHg. Aortic valve area, by VTI measures 2.85 cm. Pulmonic Valve: The pulmonic valve was not well visualized. Pulmonic valve regurgitation is not visualized. Aorta: The aortic root is normal in size and structure. IAS/Shunts: The interatrial septum was not well visualized.  LEFT VENTRICLE PLAX 2D LVIDd:         4.20 cm     Diastology LVIDs:         3.60 cm     LV e' medial:    6.04 cm/s LV PW:         1.30 cm     LV E/e' medial:  13.8 LV IVS:        1.40 cm     LV e' lateral:   9.25 cm/s LVOT diam:     2.20 cm     LV E/e' lateral: 9.0 LV SV:         34 LV SV Index:   15 LVOT Area:     3.80 cm  LV Volumes (MOD) LV vol d, MOD A2C: 73.2 ml LV vol d, MOD A4C: 78.5 ml LV vol s, MOD A2C: 36.4 ml LV vol s, MOD A4C: 38.0 ml LV SV MOD A2C:     36.8 ml LV SV MOD A4C:     78.5 ml LV SV MOD BP:      38.5 ml RIGHT VENTRICLE RV Basal diam:  3.90 cm RV Mid diam:    3.50 cm LEFT ATRIUM              Index        RIGHT ATRIUM           Index LA diam:        6.40 cm  2.81 cm/m   RA Area:     28.40 cm LA Vol (A2C):   117.0 ml 51.32 ml/m  RA Volume:   85.80 ml  37.63 ml/m LA Vol (A4C):   69.5 ml  30.48 ml/m LA Biplane Vol: 94.8 ml  41.58 ml/m  AORTIC VALVE AV Area (Vmax):    2.24 cm AV Area (Vmean):   2.20 cm AV Area (VTI):     2.85 cm AV Vmax:           74.60 cm/s AV Vmean:          48.650 cm/s AV VTI:            0.118  m AV Peak Grad:      2.2 mmHg AV Mean Grad:      1.5 mmHg LVOT Vmax:          43.90 cm/s LVOT Vmean:        28.200 cm/s LVOT VTI:          0.089 m LVOT/AV VTI ratio: 0.75  AORTA Ao Root diam: 3.70 cm MITRAL VALVE               TRICUSPID VALVE MV Area (PHT): 5.34 cm    TR Peak grad:   9.5 mmHg MV Decel Time: 142 msec    TR Vmax:        154.00 cm/s MV E velocity: 83.30 cm/s                            SHUNTS                            Systemic VTI:  0.09 m                            Systemic Diam: 2.20 cm Joetta Mustache Electronically signed by Joetta Mustache Signature Date/Time: 06/24/2023/11:49:00 AM    Final    DG Chest Portable 1 View Result Date: 06/23/2023 CLINICAL DATA:  vtach, cardioverted EXAM: PORTABLE CHEST 1 VIEW COMPARISON:  Chest radiograph 09/21/2022 FINDINGS: Lung volumes remain low. Cardiomegaly is stable. Mediastinal contours are unchanged. Mild vascular congestion. No focal airspace disease. No pneumothorax or large pleural effusion. On limited assessment, no acute osseous findings. IMPRESSION: Low lung volumes with cardiomegaly and mild vascular congestion. Electronically Signed   By: Chadwick Colonel M.D.   On: 06/23/2023 20:04      HISTORY OF PRESENT ILLNESS 89 y.o. male with history of A-fib on edoxaban  (switched to edoxaban  as Eliquis  interacted with prostate cancer treatment), CHF, left MCA stroke in 2021 status post thrombectomy, diabetes, hypertension, hyperlipidemia, sleep apnea on CPAP, CKD stage IIIb and prostate cancer on Xtandi  and leuprolide  admitted for acute onset right facial droop, right-sided weakness and altered mental status.  He was found to have left MCA M1 occlusion and was taken to interventional radiology for successful mechanical thrombectomy.  He remained intubated after the procedure.  Extubated 6/9.  NIH on Admission 26    HOSPITAL COURSE Stroke:  left MCA infarct with left M1 occlusion s/p IR with TICI3, etiology likely A-fib on DOAC in the setting of recent cardioversion Code Stroke CT head stable acute cytotoxic edema in left MCA  territory ASPECTS 7 CTA head & neck recurrent left MCA M1 occlusion, left VA moderate stenosis at origin, right VA occlusion Status post IR with TICI3 Follow-up CT Evolving left MCA territory infarct with mild edema and sulcal  effacement. No midline shift. Foci of hyperattenuation in the posterior left temporal lobe suggestive of developing petechial hemorrhage. Hyperdense appearance of the distal left M1 segment and left M2  MRI Evolving acute to early subacute ischemic nonhemorrhagic left MCA infarct. Pan CT showed no advanced malignancy  2D Echo EF 45 to 50% LDL 32 HgbA1c 8.7 VTE prophylaxis - Edoxaban  Edoxaban  prior to admission, continue resumed Edoxaban  Therapy recommendations: SNF  Disposition: SNF 6/20   Hx of Stroke/TIA 08/2019 admitted for left BG/CR and caudate infarct.  CTA head and neck left M1 occlusion, right MCA occlusion.  CTP 0/98 cc.  S/p IR with TICI3.  MRI post IR showed left M1 patent.  EF 60 to 65%.  LDL 27, A1c 7.8.  Discharged on aspirin  325 and Lipitor 10.    Atrial fibrillation Was on pradaxa , then changed to eliquis  - ? Due to rectal bleeding ? AC was held during 06/2019 [facility] admission due to rectal bleeding and high-risk of recurrent bleeding.  CHADS-VASC score of 5 at that time, with 7.2% risk of stroke per year, but need to weigh risk of stroke versus risk of recurrent GI bleeding with Eliquis . At the time of discharge, a mutual decision was made with patient, family, and medical team to not restart anticoagulation for fear of recurrence of life-threatening bleeding Stroke in 08/2019 as above, on discharge patient and family reluctant to start DOAC, therefore they were recommended to discuss with his cardiologist regarding DOAC as outpatient. Was put back on eliquis  but then changed to edoxaban  as Eliquis  has interaction with prostate cancer medications. Current home meds including amiodarone  200 mg daily, verapamil  180 mg daily, edoxaban  30 mg daily Cardiac cath  and cardioversion 2 days prior to admission Still in afib RVR with pacing, continue telemetry Continue home amiodarone  and verapamil   Amio drip-->transitioned to PO 6/10, continued on discharge (PTA medication) Continue Edoxaban   Not Watchman device candidate at this time   Hypertension Home meds: Verapamil , on hold Stable Long-term BP goal normotensive   Hyperlipidemia Home meds: Atorvastatin  10 mg daily, continue LDL 32, goal < 70 High intensity statin not indicated as LDL below goal Continue statin at discharge   Diabetes type II poorly controlled Home meds:  Farxiga  10 mg daily HgbA1c 8.7, goal < 7.0 CBGs SSI Recommend close follow-up with PCP for better DM control  Dysphagia Patient has post-stroke dysphagia SLP following Failed bedside swallow evaluation MBS passed 6/11 on DYS1 and thin liquids   Other Stroke Risk Factors Obesity, Body mass index is 31.27 kg/m., BMI >/= 30 associated with increased stroke risk, recommend weight loss, diet and exercise as appropriate  Obstructive sleep apnea, on CPAP at home CHF   Other Active Problems Acute Cystitis Urinalysis with large leukocytes, antibiotics x6 days completed CTAP with no evidence of hemorrhage, but there is increase bladder wall prominence concerning for cystitis.  Prostate cancer-  resume home Xtandi  History of rectal bleeding CKD 3B, creatinine 2.26--1.82-1.33-1.47-1.51--1.48-1.43-1.52-1.40   DISCHARGE EXAM Blood pressure 123/74, pulse 75, temperature 97.8 F (36.6 C), temperature source Oral, resp. rate 19, height 6' 1 (1.854 m), weight 107.5 kg, SpO2 96%.  PHYSICAL EXAM General:Elderly patient in no acute distress Psych: Calm and cooperative CV: Irregular, Irregular Respiratory: Regular, unlabored breathing, room air   NEURO  Eyes open spontaneously, continued global aphasia. Patient attempts to speak, makes mostly guttural incomprehensible sounds. Nods head frequently, but not always  appropriately. EOMI, fully tracks examiner.  PERRL. Antigravity strength, spontaneous movement of all 4 with RUE slightly weaker  Discharge Diet       Diet   DIET - DYS 1 Room service appropriate? No; Fluid consistency: Thin   liquids  DISCHARGE PLAN Disposition:  SNF Edoxaban  (PTA medication) for secondary stroke prevention  Ongoing stroke risk factor control by Primary Care Physician at time of discharge Follow-up PCP Laurence Pons, NP in 2 weeks. Follow-up in [facility] in 8 weeks, office to schedule an appointment.   35 minutes were spent preparing discharge.  Pt seen by Neuro NP/APP with MD. Note/plan to be edited by MD as needed.  Audrene Lease, DNP, AGACNP-BC [facility] Please use AMION for contact information & EPIC for messaging.  I have personally obtained history,examined this patient, reviewed notes, independently viewed imaging studies, participated in medical decision making and plan of care.ROS completed by me personally and pertinent positives fully documented  I have made any additions or clarifications directly to the above note. Agree with note above.    Ardella Beaver, MD Medical Director [facility] Pager: [phone] 07/11/2023 11:32 AM Who presents

## 2023-07-11 NOTE — TOC Transition Note (Signed)
 Transition of Care [facility]) - Discharge Note   Patient Details  Name: [name] MRN: [id number] Date of Birth: 1934-01-03  [facility] CM/SW Contact:  Tandy Fam, LCSW Phone Number: 07/11/2023, 2:04 PM   Clinical Narrative:   Patient received insurance authorization to admit to [facility], and CSW confirmed bed availability for today. CSW updated daughter, [name], via phone and she is in agreement. CSW updated MD, sent discharge information to [facility]. Transport arranged with [facility] for next available.  Nurse to call report to [phone], Room 612B.    Final next level of care: Skilled Nursing Facility Barriers to Discharge: Barriers Resolved   Patient Goals and CMS Choice            Discharge Placement              Patient chooses bed at: [facility] Patient to be transferred to facility by: [facility] Name of family member notified: Tiffany Patient and family notified of of transfer: 07/11/23  Discharge Plan and Services Additional resources added to the After Visit Summary for                                       Social Drivers of Health (SDOH) Interventions SDOH Screenings   Food Insecurity: No Food Insecurity (06/23/2023)  Housing: Low Risk  (06/23/2023)  Transportation Needs: No Transportation Needs (06/23/2023)  Utilities: Not At Risk (06/23/2023)  Alcohol Screen: Low Risk  (10/26/2021)  Depression (PHQ2-9): Low Risk  (12/26/2022)  Financial Resource Strain: Low Risk  (10/29/2019)  Recent Concern: Financial Resource Strain - Medium Risk (09/08/2019)  Physical Activity: Inactive (10/29/2019)  Social Connections: Moderately Integrated (06/23/2023)  Stress: No Stress Concern Present (10/29/2019)  Recent Concern: Stress - Stress Concern Present (09/08/2019)  Tobacco Use: Medium Risk (06/28/2023)     Readmission Risk Interventions    07/01/2023    4:26 PM 09/11/2022    1:23 PM 12/26/2020   12:08 PM  Readmission  Risk Prevention Plan  Transportation Screening Complete Complete Complete  PCP or Specialist Appt within 3-5 Days   Complete  HRI or Home Care Consult   Complete  Social Work Consult for Recovery Care Planning/Counseling   Complete  Palliative Care Screening   Not Applicable  Medication Review Oceanographer) Complete Referral to Pharmacy   PCP or Specialist appointment within 3-5 days of discharge Complete Complete   HRI or Home Care Consult Complete Complete   SW Recovery Care/Counseling Consult Complete Complete   Palliative Care Screening Not Applicable Not Applicable   Skilled Nursing Facility Not Applicable Not Applicable

## 2023-07-11 NOTE — Plan of Care (Signed)
  Problem: Education: Goal: Knowledge of disease or condition will improve Outcome: Adequate for Discharge Goal: Knowledge of secondary prevention will improve (MUST DOCUMENT ALL) Outcome: Adequate for Discharge Goal: Knowledge of patient specific risk factors will improve (DELETE if not current risk factor) Outcome: Adequate for Discharge   Problem: Ischemic Stroke/TIA Tissue Perfusion: Goal: Complications of ischemic stroke/TIA will be minimized Outcome: Adequate for Discharge   Problem: Coping: Goal: Will verbalize positive feelings about self Outcome: Adequate for Discharge Goal: Will identify appropriate support needs Outcome: Adequate for Discharge   Problem: Health Behavior/Discharge Planning: Goal: Ability to manage health-related needs will improve Outcome: Adequate for Discharge Goal: Goals will be collaboratively established with patient/family Outcome: Adequate for Discharge   Problem: Self-Care: Goal: Ability to participate in self-care as condition permits will improve Outcome: Adequate for Discharge Goal: Verbalization of feelings and concerns over difficulty with self-care will improve Outcome: Adequate for Discharge Goal: Ability to communicate needs accurately will improve Outcome: Adequate for Discharge   Problem: Nutrition: Goal: Risk of aspiration will decrease Outcome: Adequate for Discharge Goal: Dietary intake will improve Outcome: Adequate for Discharge   Problem: Activity: Goal: Ability to tolerate increased activity will improve Outcome: Adequate for Discharge   Problem: Respiratory: Goal: Ability to maintain a clear airway and adequate ventilation will improve Outcome: Adequate for Discharge   Problem: Role Relationship: Goal: Method of communication will improve Outcome: Adequate for Discharge   Problem: Education: Goal: Knowledge of General Education information will improve Description: Including pain rating scale, medication(s)/side  effects and non-pharmacologic comfort measures Outcome: Adequate for Discharge   Problem: Health Behavior/Discharge Planning: Goal: Ability to manage health-related needs will improve Outcome: Adequate for Discharge   Problem: Clinical Measurements: Goal: Ability to maintain clinical measurements within normal limits will improve Outcome: Adequate for Discharge Goal: Will remain free from infection Outcome: Adequate for Discharge Goal: Diagnostic test results will improve Outcome: Adequate for Discharge Goal: Respiratory complications will improve Outcome: Adequate for Discharge Goal: Cardiovascular complication will be avoided Outcome: Adequate for Discharge   Problem: Activity: Goal: Risk for activity intolerance will decrease Outcome: Adequate for Discharge   Problem: Nutrition: Goal: Adequate nutrition will be maintained Outcome: Adequate for Discharge   Problem: Coping: Goal: Level of anxiety will decrease Outcome: Adequate for Discharge   Problem: Elimination: Goal: Will not experience complications related to bowel motility Outcome: Adequate for Discharge Goal: Will not experience complications related to urinary retention Outcome: Adequate for Discharge   Problem: Pain Managment: Goal: General experience of comfort will improve and/or be controlled Outcome: Adequate for Discharge   Problem: Safety: Goal: Ability to remain free from injury will improve Outcome: Adequate for Discharge   Problem: Skin Integrity: Goal: Risk for impaired skin integrity will decrease Outcome: Adequate for Discharge   Problem: Education: Goal: Understanding of CV disease, CV risk reduction, and recovery process will improve Outcome: Adequate for Discharge Goal: Individualized Educational Video(s) Outcome: Adequate for Discharge   Problem: Activity: Goal: Ability to return to baseline activity level will improve Outcome: Adequate for Discharge   Problem:  Cardiovascular: Goal: Ability to achieve and maintain adequate cardiovascular perfusion will improve Outcome: Adequate for Discharge Goal: Vascular access site(s) Level 0-1 will be maintained Outcome: Adequate for Discharge   Problem: Health Behavior/Discharge Planning: Goal: Ability to safely manage health-related needs after discharge will improve Outcome: Adequate for Discharge

## 2023-07-11 NOTE — Progress Notes (Signed)
 1835 [facility] present to pick up patient to transport to [facility] to inform them [name] is present to get patient

## 2023-07-11 NOTE — Plan of Care (Signed)
  Problem: Coping: Goal: Will verbalize positive feelings about self Outcome: Progressing Goal: Will identify appropriate support needs Outcome: Progressing   Problem: Nutrition: Goal: Risk of aspiration will decrease Outcome: Progressing   Problem: Respiratory: Goal: Ability to maintain a clear airway and adequate ventilation will improve Outcome: Progressing

## 2023-07-14 DIAGNOSIS — I130 Hypertensive heart and chronic kidney disease with heart failure and stage 1 through stage 4 chronic kidney disease, or unspecified chronic kidney disease: Secondary | ICD-10-CM | POA: Diagnosis not present

## 2023-07-14 DIAGNOSIS — N3000 Acute cystitis without hematuria: Secondary | ICD-10-CM | POA: Diagnosis not present

## 2023-07-14 DIAGNOSIS — I63412 Cerebral infarction due to embolism of left middle cerebral artery: Secondary | ICD-10-CM | POA: Diagnosis not present

## 2023-07-14 DIAGNOSIS — J449 Chronic obstructive pulmonary disease, unspecified: Secondary | ICD-10-CM | POA: Diagnosis not present

## 2023-07-14 DIAGNOSIS — I509 Heart failure, unspecified: Secondary | ICD-10-CM | POA: Diagnosis not present

## 2023-07-14 DIAGNOSIS — I69391 Dysphagia following cerebral infarction: Secondary | ICD-10-CM | POA: Diagnosis not present

## 2023-07-14 DIAGNOSIS — E1122 Type 2 diabetes mellitus with diabetic chronic kidney disease: Secondary | ICD-10-CM | POA: Diagnosis not present

## 2023-07-14 DIAGNOSIS — I4891 Unspecified atrial fibrillation: Secondary | ICD-10-CM | POA: Diagnosis not present

## 2023-07-14 DIAGNOSIS — D509 Iron deficiency anemia, unspecified: Secondary | ICD-10-CM | POA: Diagnosis not present

## 2023-07-14 DIAGNOSIS — N183 Chronic kidney disease, stage 3 unspecified: Secondary | ICD-10-CM | POA: Diagnosis not present

## 2023-07-16 ENCOUNTER — Other Ambulatory Visit: Payer: Self-pay

## 2023-07-16 DIAGNOSIS — J449 Chronic obstructive pulmonary disease, unspecified: Secondary | ICD-10-CM | POA: Diagnosis not present

## 2023-07-16 DIAGNOSIS — D509 Iron deficiency anemia, unspecified: Secondary | ICD-10-CM | POA: Diagnosis not present

## 2023-07-16 DIAGNOSIS — I69391 Dysphagia following cerebral infarction: Secondary | ICD-10-CM | POA: Diagnosis not present

## 2023-07-16 DIAGNOSIS — I4891 Unspecified atrial fibrillation: Secondary | ICD-10-CM | POA: Diagnosis not present

## 2023-07-16 DIAGNOSIS — N3000 Acute cystitis without hematuria: Secondary | ICD-10-CM | POA: Diagnosis not present

## 2023-07-16 DIAGNOSIS — I63412 Cerebral infarction due to embolism of left middle cerebral artery: Secondary | ICD-10-CM | POA: Diagnosis not present

## 2023-07-16 DIAGNOSIS — I509 Heart failure, unspecified: Secondary | ICD-10-CM | POA: Diagnosis not present

## 2023-07-16 DIAGNOSIS — I130 Hypertensive heart and chronic kidney disease with heart failure and stage 1 through stage 4 chronic kidney disease, or unspecified chronic kidney disease: Secondary | ICD-10-CM | POA: Diagnosis not present

## 2023-07-16 DIAGNOSIS — E1122 Type 2 diabetes mellitus with diabetic chronic kidney disease: Secondary | ICD-10-CM | POA: Diagnosis not present

## 2023-07-16 DIAGNOSIS — N183 Chronic kidney disease, stage 3 unspecified: Secondary | ICD-10-CM | POA: Diagnosis not present

## 2023-07-16 NOTE — Progress Notes (Signed)
 Specialty Pharmacy Refill Coordination Note  [name] is a 89 y.o. male, patients daughter in law was contacted today regarding refills of specialty medication(s) Enzalutamide  (XTANDI )   Patient requested Delivery   Delivery date: 07/18/23   Verified address: [address]  [place] [address]   Medication will be filled on 07/17/23.

## 2023-07-17 ENCOUNTER — Ambulatory Visit: Admitting: Cardiology

## 2023-07-18 DIAGNOSIS — I63412 Cerebral infarction due to embolism of left middle cerebral artery: Secondary | ICD-10-CM | POA: Diagnosis not present

## 2023-07-18 DIAGNOSIS — E1122 Type 2 diabetes mellitus with diabetic chronic kidney disease: Secondary | ICD-10-CM | POA: Diagnosis not present

## 2023-07-18 DIAGNOSIS — I130 Hypertensive heart and chronic kidney disease with heart failure and stage 1 through stage 4 chronic kidney disease, or unspecified chronic kidney disease: Secondary | ICD-10-CM | POA: Diagnosis not present

## 2023-07-18 DIAGNOSIS — J449 Chronic obstructive pulmonary disease, unspecified: Secondary | ICD-10-CM | POA: Diagnosis not present

## 2023-07-18 DIAGNOSIS — I509 Heart failure, unspecified: Secondary | ICD-10-CM | POA: Diagnosis not present

## 2023-07-18 DIAGNOSIS — D509 Iron deficiency anemia, unspecified: Secondary | ICD-10-CM | POA: Diagnosis not present

## 2023-07-18 DIAGNOSIS — R319 Hematuria, unspecified: Secondary | ICD-10-CM | POA: Diagnosis not present

## 2023-07-18 DIAGNOSIS — N183 Chronic kidney disease, stage 3 unspecified: Secondary | ICD-10-CM | POA: Diagnosis not present

## 2023-07-18 DIAGNOSIS — N3001 Acute cystitis with hematuria: Secondary | ICD-10-CM | POA: Diagnosis not present

## 2023-07-18 DIAGNOSIS — I69391 Dysphagia following cerebral infarction: Secondary | ICD-10-CM | POA: Diagnosis not present

## 2023-07-18 DIAGNOSIS — I4891 Unspecified atrial fibrillation: Secondary | ICD-10-CM | POA: Diagnosis not present

## 2023-07-19 DIAGNOSIS — I69391 Dysphagia following cerebral infarction: Secondary | ICD-10-CM | POA: Diagnosis not present

## 2023-07-19 DIAGNOSIS — N3000 Acute cystitis without hematuria: Secondary | ICD-10-CM | POA: Diagnosis not present

## 2023-07-19 DIAGNOSIS — I130 Hypertensive heart and chronic kidney disease with heart failure and stage 1 through stage 4 chronic kidney disease, or unspecified chronic kidney disease: Secondary | ICD-10-CM | POA: Diagnosis not present

## 2023-07-19 DIAGNOSIS — I509 Heart failure, unspecified: Secondary | ICD-10-CM | POA: Diagnosis not present

## 2023-07-19 DIAGNOSIS — I4891 Unspecified atrial fibrillation: Secondary | ICD-10-CM | POA: Diagnosis not present

## 2023-07-19 DIAGNOSIS — I63412 Cerebral infarction due to embolism of left middle cerebral artery: Secondary | ICD-10-CM | POA: Diagnosis not present

## 2023-07-19 DIAGNOSIS — D509 Iron deficiency anemia, unspecified: Secondary | ICD-10-CM | POA: Diagnosis not present

## 2023-07-19 DIAGNOSIS — N183 Chronic kidney disease, stage 3 unspecified: Secondary | ICD-10-CM | POA: Diagnosis not present

## 2023-07-19 DIAGNOSIS — E1122 Type 2 diabetes mellitus with diabetic chronic kidney disease: Secondary | ICD-10-CM | POA: Diagnosis not present

## 2023-07-19 DIAGNOSIS — J449 Chronic obstructive pulmonary disease, unspecified: Secondary | ICD-10-CM | POA: Diagnosis not present

## 2023-07-21 DIAGNOSIS — I4891 Unspecified atrial fibrillation: Secondary | ICD-10-CM | POA: Diagnosis not present

## 2023-07-21 DIAGNOSIS — N3001 Acute cystitis with hematuria: Secondary | ICD-10-CM | POA: Diagnosis not present

## 2023-07-21 DIAGNOSIS — I63412 Cerebral infarction due to embolism of left middle cerebral artery: Secondary | ICD-10-CM | POA: Diagnosis not present

## 2023-07-21 DIAGNOSIS — I509 Heart failure, unspecified: Secondary | ICD-10-CM | POA: Diagnosis not present

## 2023-07-21 DIAGNOSIS — D509 Iron deficiency anemia, unspecified: Secondary | ICD-10-CM | POA: Diagnosis not present

## 2023-07-21 DIAGNOSIS — G4733 Obstructive sleep apnea (adult) (pediatric): Secondary | ICD-10-CM | POA: Diagnosis not present

## 2023-07-21 DIAGNOSIS — I130 Hypertensive heart and chronic kidney disease with heart failure and stage 1 through stage 4 chronic kidney disease, or unspecified chronic kidney disease: Secondary | ICD-10-CM | POA: Diagnosis not present

## 2023-07-21 DIAGNOSIS — I69391 Dysphagia following cerebral infarction: Secondary | ICD-10-CM | POA: Diagnosis not present

## 2023-07-21 DIAGNOSIS — N184 Chronic kidney disease, stage 4 (severe): Secondary | ICD-10-CM | POA: Diagnosis not present

## 2023-07-21 DIAGNOSIS — J449 Chronic obstructive pulmonary disease, unspecified: Secondary | ICD-10-CM | POA: Diagnosis not present

## 2023-07-21 DIAGNOSIS — R319 Hematuria, unspecified: Secondary | ICD-10-CM | POA: Diagnosis not present

## 2023-07-21 DIAGNOSIS — E1122 Type 2 diabetes mellitus with diabetic chronic kidney disease: Secondary | ICD-10-CM | POA: Diagnosis not present

## 2023-07-23 DIAGNOSIS — I4891 Unspecified atrial fibrillation: Secondary | ICD-10-CM | POA: Diagnosis not present

## 2023-07-23 DIAGNOSIS — I63412 Cerebral infarction due to embolism of left middle cerebral artery: Secondary | ICD-10-CM | POA: Diagnosis not present

## 2023-07-23 DIAGNOSIS — N184 Chronic kidney disease, stage 4 (severe): Secondary | ICD-10-CM | POA: Diagnosis not present

## 2023-07-23 DIAGNOSIS — I69391 Dysphagia following cerebral infarction: Secondary | ICD-10-CM | POA: Diagnosis not present

## 2023-07-23 DIAGNOSIS — I130 Hypertensive heart and chronic kidney disease with heart failure and stage 1 through stage 4 chronic kidney disease, or unspecified chronic kidney disease: Secondary | ICD-10-CM | POA: Diagnosis not present

## 2023-07-23 DIAGNOSIS — N3001 Acute cystitis with hematuria: Secondary | ICD-10-CM | POA: Diagnosis not present

## 2023-07-23 DIAGNOSIS — I509 Heart failure, unspecified: Secondary | ICD-10-CM | POA: Diagnosis not present

## 2023-07-23 DIAGNOSIS — J449 Chronic obstructive pulmonary disease, unspecified: Secondary | ICD-10-CM | POA: Diagnosis not present

## 2023-07-23 DIAGNOSIS — R319 Hematuria, unspecified: Secondary | ICD-10-CM | POA: Diagnosis not present

## 2023-07-23 DIAGNOSIS — D509 Iron deficiency anemia, unspecified: Secondary | ICD-10-CM | POA: Diagnosis not present

## 2023-07-23 DIAGNOSIS — E1122 Type 2 diabetes mellitus with diabetic chronic kidney disease: Secondary | ICD-10-CM | POA: Diagnosis not present

## 2023-07-23 NOTE — Progress Notes (Unsigned)
 Electrophysiology Clinic Note    Date:  07/24/2023  Patient ID:  [name] 1933-11-25, MRN [id number] PCP:  Glenn Fish, NP  Cardiologist:  Glenn Cave, MD Electrophysiologist: Glenn ONEIDA HOLTS, MD   Discussed the use of AI scribe software for clinical note transcription with the patient, who gave verbal consent to proceed.   Patient Profile    Chief Complaint: hosptial follow-up  History of Present Illness: [name] is a 89 y.o. male with PMH notable for perm AFib, SND s/p leadless PPM, HFpEF, CKD 3b, HTN, T2DM, metastatic prostate cancer, HLD, COPD, OSA ; seen today for Glenn ONEIDA HOLTS, MD for post hospital follow up.    Admitted 6/2-06/2023 He presented to [facility] ER in [place] VT s/p DCCV to sinus/paced rhythm. EP was consulted while in the hospital, and [name] thought his VT was most consistent with left anterior fascicle VT, most often idiopathic.  TTE with slightly reduced LVEF, LHC with insignificant CAD. He was started on amiodarone  and verapamil  with considerable improvement in his ectopy.  He re-presented to hospital 6/7 with acute CVA, thought d/t holding edoxaban  for his LHC. He had frequent PVCs while hospitalized, cardiology consulted, no meds changed. He was discharged to SNF on 6/20.   On follow-up today, he continues to heal from his recent stroke at a SNF facility.  He is planning to discharge from the facility and return to home tomorrow.  He denies palpitations, chest pain, chest pressure, dizziness or lightheadedness. He recently experienced bleeding from his penis due to a urinary tract infection.  SNF facility held his blood thinner briefly, he has back on it now.  His daughter-in-law joins for today's visit most of history.  She is appropriately apprehensive about him returning home to live with his wife who has dementia.  They are looking into hiring help in the home.      Arrhythmia/Device History MDT leadless PPM,  micra   AAD -  Amiodarone      ROS:  Please see the history of present illness. All other systems are reviewed and otherwise negative.    Physical Exam    VS:  BP 128/82 (BP Location: Left Arm, Patient Position: Sitting, Cuff Size: Normal)   Pulse 74   Ht 6' (1.829 m)   Wt 219 lb 6.4 oz (99.5 kg)   SpO2 96%   BMI 29.76 kg/m  BMI: Body mass index is 29.76 kg/m.      Wt Readings from Last 3 Encounters:  07/24/23 219 lb 6.4 oz (99.5 kg)  06/28/23 236 lb 15.9 oz (107.5 kg)  06/28/23 242 lb 3.2 oz (109.9 kg)     GEN- The patient is well appearing, alert and oriented x 3 today.   Lungs- Clear to ausculation bilaterally, normal work of breathing.  Heart- Irregularly irregular rate and rhythm, no murmurs, rubs or gallops Extremities- Trace peripheral edema, warm, dry   Device interrogation done today and reviewed by myself:  Battery > 8 years Lead thresholds, impedence, sensing stable  VP 82%    Studies Reviewed   Previous EP, cardiology notes.    EKG is ordered. Personal review of EKG from today shows:    EKG Interpretation Date/Time:  Thursday July 24 2023 11:02:01 EDT Ventricular Rate:  74 PR Interval:    QRS Duration:  136 QT Interval:  432 QTC Calculation: 479 R Axis:   -43  Text Interpretation: Ventricular-paced rhythm with occasional Premature ventricular complexes Confirmed by [name] (  [id number]) on 07/24/2023 11:10:15 AM     LHC, 06/24/2023   Ost LAD to Prox LAD lesion is 40% stenosed.   There is mild left ventricular systolic dysfunction.   The left ventricular ejection fraction is 35-45% by visual estimate.   1.  Insignificant coronary artery disease with 40-50% ostial/proximal LAD 2.  Mild reduced left ventricular function with estimate LVEF 40%  TTE, 06/24/2023  1. Technically difficult study.   2. Left ventricular ejection fraction, by estimation, is 45 to 50%. The left ventricle has mildly decreased function. The left ventricle demonstrates  regional wall motion abnormalities (see scoring diagram/findings for description). There is moderate left ventricular hypertrophy. Left ventricular diastolic parameters are indeterminate.   3. Right ventricular systolic function was not well visualized. The right ventricular size is not well visualized.   4. Left atrial size was moderately dilated.   5. The mitral valve is normal in structure. Trivial mitral valve regurgitation.   6. The aortic valve is tricuspid. Aortic valve regurgitation is not visualized.   TTE, 07/09/2022  1. Left ventricular ejection fraction, by estimation, is 55 to 60%. Left ventricular ejection fraction by 3D volume is 64 %. The left ventricle has normal function. The left ventricle has no regional wall motion abnormalities. There is moderate left ventricular hypertrophy. Left ventricular diastolic parameters are indeterminate. The average left ventricular global longitudinal strain is -15.2 %.   2. Right ventricular systolic function is normal. The right ventricular size is normal. There is mildly elevated pulmonary artery systolic pressure. The estimated right ventricular systolic pressure is 40.3 mmHg.   3. Left atrial size was severely dilated.   4. The mitral valve is normal in structure. Mild mitral valve regurgitation. No evidence of mitral stenosis.   5. Tricuspid valve regurgitation is moderate.   6. The aortic valve is tricuspid. Aortic valve regurgitation is not visualized. Aortic valve sclerosis is present, with no evidence of aortic valve stenosis.   7. The inferior vena cava is normal in size with greater than 50% respiratory variability, suggesting right atrial pressure of 3 mmHg.   Assessment and Plan     #) idiopathic VT Recent LHC without significant CAD VT appears quiescent on amiodarone  200mg  daily, will continue Verapamil  is not listed on his facility med list today, but was prescribed when discharged from hospital Restart 180mg  verapamil   daily Update thyroid  labs today  #) perm AFib #) Hypercoag d/t perm afib #) h/o CVA Patient recently had CVA after edoxaban  was held for [facility] performed very quickly after DCCV for VT.  Discussed with HF pharmacist, do not consider this event an edoxaban  failure given that the medication was held All DOACs have significant med-med interactions with his prostate ca treatment (xtandi ) Continue 30mg  edoxaban  daily at this time CHA2DS2-VASc Score = at least 7 [CHF History: 0, HTN History: 1, Diabetes History: 1, Stroke History: 2, Vascular Disease History: 1, Age Score: 2, Gender Score: 0].  Therefore, the patient's annual risk of stroke is 11.2 %.     #) residual deficits from CVA He will be returning home from SNF tomorrow with minimal assistance in his home Family are very involved, but request assistance with this transition Will refer to cardiology social work for additional resources       Current medicines are reviewed at length with the patient today.   The patient does not have concerns regarding his medicines.  The following changes were made today:  none  Labs/ tests ordered today include:  Orders Placed This Encounter  Procedures   Hepatic function panel   TSH   T4, free   Referral to HRT/VAS Care Navigation   EKG 12-Lead     Disposition:  Patient prefers to transfer all cardiology care to [facility]  Follow up with Dr. Cindie or EP APP in 3 months Follow-up with gen cards in about 6 weeks to re-establish care   I spent 35 minutes in face-to-face care of this patient today, and an additional 15 minutes coordinating care with his family and HF pharmacist.    Signed, Chantal Needle, NP  07/24/23  3:47 PM  Electrophysiology [facility]

## 2023-07-24 ENCOUNTER — Ambulatory Visit: Attending: Cardiology | Admitting: Cardiology

## 2023-07-24 VITALS — BP 128/82 | HR 74 | Ht 72.0 in | Wt 219.4 lb

## 2023-07-24 DIAGNOSIS — I6389 Other cerebral infarction: Secondary | ICD-10-CM | POA: Diagnosis not present

## 2023-07-24 DIAGNOSIS — I4821 Permanent atrial fibrillation: Secondary | ICD-10-CM | POA: Diagnosis not present

## 2023-07-24 DIAGNOSIS — Z950 Presence of cardiac pacemaker: Secondary | ICD-10-CM | POA: Diagnosis not present

## 2023-07-24 DIAGNOSIS — I472 Ventricular tachycardia, unspecified: Secondary | ICD-10-CM

## 2023-07-24 LAB — CUP PACEART INCLINIC DEVICE CHECK
Date Time Interrogation Session: 20250703160337
Implantable Pulse Generator Implant Date: 20221129

## 2023-07-24 MED ORDER — VERAPAMIL HCL ER 180 MG PO TBCR: 180.0000 mg | EXTENDED_RELEASE_TABLET | Freq: Every day | ORAL | 3 refills | Status: DC

## 2023-07-24 NOTE — Patient Instructions (Addendum)
 Medication Instructions:  CONTINUE Savaysa  for now- we will call with update   START BACK Verapamil  180 mg daily   *If you need a refill on your cardiac medications before your next appointment, please call your pharmacy*  Lab Work: Your provider would like for you to have following labs drawn today LFT, TSH, T4.    If you have labs (blood work) drawn today and your tests are completely normal, you will receive your results only by: MyChart Message (if you have MyChart) OR A paper copy in the mail If you have any lab test that is abnormal or we need to change your treatment, we will call you to review the results.  Follow-Up: At [facility], you and your health needs are our priority.  As part of our continuing mission to provide you with exceptional heart care, our providers are all part of one team.  This team includes your primary Cardiologist (physician) and Advanced Practice Providers or APPs (Physician Assistants and Nurse Practitioners) who all work together to provide you with the care you need, when you need it.  Your next appointment:   6 weeks  Provider:   Redell Cave, MD   3 months back with Dr.Lambert for EP.   We recommend signing up for the patient portal called MyChart.  Sign up information is provided on this After Visit Summary.  MyChart is used to connect with patients for Virtual Visits (Telemedicine).  Patients are able to view lab/test results, encounter notes, upcoming appointments, etc.  Non-urgent messages can be sent to your provider as well.   To learn more about what you can do with MyChart, go to [email or web address].   Other Instructions Referral to social work

## 2023-07-28 ENCOUNTER — Telehealth: Payer: Self-pay | Admitting: Licensed Clinical Social Worker

## 2023-07-28 ENCOUNTER — Telehealth: Payer: Self-pay | Admitting: *Deleted

## 2023-07-28 NOTE — Progress Notes (Unsigned)
 Heart and Vascular Care Navigation  07/28/2023  [name] 1933-12-06 [id number]  Reason for Referral: home resources post SNF discharge Patient is participating in a Managed Medicaid Plan: No, UHC Medicare only  Engaged with pt wife and pt daughter in law Tiffany for initial visit for Heart and Vascular Care Coordination.                                                                                                   Assessment:                                     LCSW called and initially spoke with pt wife Barnie. Introduced self, role, reason for call. Pt wife is very distressed by pt being unable to communicate things clearly post stroke. She is unable to tell me what services they may have coming into the home and shares she does not drive and isnt sure how he's going to get to appts. She was tearful and anxious throughout the call. LCSW inquired if I could speak with their daughter in law Tiffany as she is HCPOA and additional contact. Pt wife in agreement.   I was able to reach Tiffany and complete assessment fully. Introduced self, role, reason for call. She confirmed their home address. Share that pt is doing well minus some aphasia, and is physically independent still at this time. He has PT and RN coming in from [facility] this week. She confirms he used to drive and they will need transportation assistance to appts. We discussed how to call [facility] Medicare to see if any rides, Cancer [facility] assistance and [facility] assistance if family/friends unable to take and no ride benefit. She and extended family are as hands on as possible, but she shares his wife has dementia and is not able to provide much assistance at this time. She requests community resources be sent to [address] [place], [place], [address] and her number be the primary for appts.   She inquires if Neurology really is booking out as far as October, shared I would check and see if any openings.   No additional questions at  this time.   HRT/VAS Care Coordination     Patients Home Cardiology Office [facility]   Outpatient Care Team Social Worker   Social Worker Name: Marit Lark, [place], [phone]   Living arrangements for the past 2 months Single Family Home   Lives with: Spouse   Patient Current Insurance Coverage Managed Medicare   Patient Has Concern With Paying Medical Bills No   Does Patient Have Prescription Coverage? Yes   Home Assistive Devices/Equipment CPAP; Cane (specify quad or straight); Eyeglasses; Blood pressure cuff; CBG Meter  wears readings glasses   DME Agency [employer]   [facility]       Social History:  SDOH Screenings   Food Insecurity: No Food Insecurity (07/28/2023)  Housing: Low Risk  (07/28/2023)  Transportation Needs: No Transportation Needs (07/28/2023)  Utilities: Not At Risk (07/28/2023)  Alcohol Screen: Low Risk  (10/26/2021)  Depression (PHQ2-9): Low Risk  (12/26/2022)  Financial Resource Strain: Low Risk  (07/28/2023)  Physical Activity: Inactive (10/29/2019)  Social Connections: Moderately Integrated (06/23/2023)  Stress: No Stress Concern Present (10/29/2019)  Recent Concern: Stress - Stress Concern Present (09/08/2019)  Tobacco Use: Medium Risk (06/28/2023)  Health Literacy: Inadequate Health Literacy (07/28/2023)    SDOH Interventions: Financial Resources:  Surveyor, quantity Strain Interventions: [employer] Provided- transportation, [employer], and additional resources  Food Insecurity:  Food Insecurity Interventions: Intervention Not Indicated  Housing Insecurity:  Housing Interventions: Intervention Not Indicated  Transportation:   Transportation Interventions: Patient Resources (Friends/Family), Community Resources Provided    Other Care Navigation Interventions:     Provided Pharmacy assistance resources  Pt daughter in law denied any issues with obtaining or  affording medications- some challenges with med management due to spouse confusion   Follow-up plan:   LCSW to mail my card, Stage manager information, information about in home care and information about transportation. Encouraged pt daughter in law to call me as needed. Neurology was booked through October but put pt on cancellation list and will call if any openings, daughter in law aware.

## 2023-07-28 NOTE — Telephone Encounter (Signed)
 Wife called and stated patient had a stroke in the beginning of June and it is too difficult to get him to appointments.  She requested to cancel appointments for 07/29/23 and will call to reschedule.

## 2023-07-29 ENCOUNTER — Telehealth: Payer: Self-pay

## 2023-07-29 ENCOUNTER — Inpatient Hospital Stay: Admitting: Oncology

## 2023-07-29 ENCOUNTER — Inpatient Hospital Stay

## 2023-07-29 DIAGNOSIS — I69392 Facial weakness following cerebral infarction: Secondary | ICD-10-CM | POA: Diagnosis not present

## 2023-07-29 DIAGNOSIS — H9190 Unspecified hearing loss, unspecified ear: Secondary | ICD-10-CM | POA: Diagnosis not present

## 2023-07-29 DIAGNOSIS — G4733 Obstructive sleep apnea (adult) (pediatric): Secondary | ICD-10-CM | POA: Diagnosis not present

## 2023-07-29 DIAGNOSIS — N184 Chronic kidney disease, stage 4 (severe): Secondary | ICD-10-CM | POA: Diagnosis not present

## 2023-07-29 DIAGNOSIS — I493 Ventricular premature depolarization: Secondary | ICD-10-CM | POA: Diagnosis not present

## 2023-07-29 DIAGNOSIS — Z950 Presence of cardiac pacemaker: Secondary | ICD-10-CM | POA: Diagnosis not present

## 2023-07-29 DIAGNOSIS — E1122 Type 2 diabetes mellitus with diabetic chronic kidney disease: Secondary | ICD-10-CM | POA: Diagnosis not present

## 2023-07-29 DIAGNOSIS — I130 Hypertensive heart and chronic kidney disease with heart failure and stage 1 through stage 4 chronic kidney disease, or unspecified chronic kidney disease: Secondary | ICD-10-CM | POA: Diagnosis not present

## 2023-07-29 DIAGNOSIS — D509 Iron deficiency anemia, unspecified: Secondary | ICD-10-CM | POA: Diagnosis not present

## 2023-07-29 DIAGNOSIS — E785 Hyperlipidemia, unspecified: Secondary | ICD-10-CM | POA: Diagnosis not present

## 2023-07-29 DIAGNOSIS — K5909 Other constipation: Secondary | ICD-10-CM | POA: Diagnosis not present

## 2023-07-29 DIAGNOSIS — I5033 Acute on chronic diastolic (congestive) heart failure: Secondary | ICD-10-CM | POA: Diagnosis not present

## 2023-07-29 DIAGNOSIS — I214 Non-ST elevation (NSTEMI) myocardial infarction: Secondary | ICD-10-CM | POA: Diagnosis not present

## 2023-07-29 DIAGNOSIS — I69320 Aphasia following cerebral infarction: Secondary | ICD-10-CM | POA: Diagnosis not present

## 2023-07-29 DIAGNOSIS — I4821 Permanent atrial fibrillation: Secondary | ICD-10-CM | POA: Diagnosis not present

## 2023-07-29 DIAGNOSIS — I472 Ventricular tachycardia, unspecified: Secondary | ICD-10-CM | POA: Diagnosis not present

## 2023-07-29 DIAGNOSIS — I69391 Dysphagia following cerebral infarction: Secondary | ICD-10-CM | POA: Diagnosis not present

## 2023-07-29 DIAGNOSIS — N3001 Acute cystitis with hematuria: Secondary | ICD-10-CM | POA: Diagnosis not present

## 2023-07-29 DIAGNOSIS — J449 Chronic obstructive pulmonary disease, unspecified: Secondary | ICD-10-CM | POA: Diagnosis not present

## 2023-07-29 DIAGNOSIS — R1312 Dysphagia, oropharyngeal phase: Secondary | ICD-10-CM | POA: Diagnosis not present

## 2023-07-29 DIAGNOSIS — Z7984 Long term (current) use of oral hypoglycemic drugs: Secondary | ICD-10-CM | POA: Diagnosis not present

## 2023-07-29 DIAGNOSIS — I69351 Hemiplegia and hemiparesis following cerebral infarction affecting right dominant side: Secondary | ICD-10-CM | POA: Diagnosis not present

## 2023-07-29 DIAGNOSIS — Z87891 Personal history of nicotine dependence: Secondary | ICD-10-CM | POA: Diagnosis not present

## 2023-07-29 DIAGNOSIS — E1165 Type 2 diabetes mellitus with hyperglycemia: Secondary | ICD-10-CM | POA: Diagnosis not present

## 2023-07-29 NOTE — Telephone Encounter (Signed)
 Soni from [facility] called asking fro verbal order for physical therapy 1 week 7 and speech therapy verbal order given. [phone]

## 2023-07-30 ENCOUNTER — Ambulatory Visit: Admitting: Nurse Practitioner

## 2023-08-02 ENCOUNTER — Ambulatory Visit: Payer: Self-pay | Admitting: Cardiology

## 2023-08-04 ENCOUNTER — Telehealth: Payer: Self-pay | Admitting: Nurse Practitioner

## 2023-08-04 ENCOUNTER — Telehealth: Payer: Self-pay

## 2023-08-04 DIAGNOSIS — E1165 Type 2 diabetes mellitus with hyperglycemia: Secondary | ICD-10-CM | POA: Diagnosis not present

## 2023-08-04 DIAGNOSIS — I69392 Facial weakness following cerebral infarction: Secondary | ICD-10-CM | POA: Diagnosis not present

## 2023-08-04 DIAGNOSIS — I130 Hypertensive heart and chronic kidney disease with heart failure and stage 1 through stage 4 chronic kidney disease, or unspecified chronic kidney disease: Secondary | ICD-10-CM | POA: Diagnosis not present

## 2023-08-04 DIAGNOSIS — I214 Non-ST elevation (NSTEMI) myocardial infarction: Secondary | ICD-10-CM | POA: Diagnosis not present

## 2023-08-04 DIAGNOSIS — Z7984 Long term (current) use of oral hypoglycemic drugs: Secondary | ICD-10-CM | POA: Diagnosis not present

## 2023-08-04 DIAGNOSIS — E1122 Type 2 diabetes mellitus with diabetic chronic kidney disease: Secondary | ICD-10-CM | POA: Diagnosis not present

## 2023-08-04 DIAGNOSIS — I472 Ventricular tachycardia, unspecified: Secondary | ICD-10-CM | POA: Diagnosis not present

## 2023-08-04 DIAGNOSIS — I493 Ventricular premature depolarization: Secondary | ICD-10-CM | POA: Diagnosis not present

## 2023-08-04 DIAGNOSIS — I5033 Acute on chronic diastolic (congestive) heart failure: Secondary | ICD-10-CM | POA: Diagnosis not present

## 2023-08-04 DIAGNOSIS — R1312 Dysphagia, oropharyngeal phase: Secondary | ICD-10-CM | POA: Diagnosis not present

## 2023-08-04 DIAGNOSIS — I4821 Permanent atrial fibrillation: Secondary | ICD-10-CM | POA: Diagnosis not present

## 2023-08-04 DIAGNOSIS — N3001 Acute cystitis with hematuria: Secondary | ICD-10-CM | POA: Diagnosis not present

## 2023-08-04 DIAGNOSIS — H9190 Unspecified hearing loss, unspecified ear: Secondary | ICD-10-CM | POA: Diagnosis not present

## 2023-08-04 DIAGNOSIS — N184 Chronic kidney disease, stage 4 (severe): Secondary | ICD-10-CM | POA: Diagnosis not present

## 2023-08-04 DIAGNOSIS — G4733 Obstructive sleep apnea (adult) (pediatric): Secondary | ICD-10-CM | POA: Diagnosis not present

## 2023-08-04 DIAGNOSIS — D509 Iron deficiency anemia, unspecified: Secondary | ICD-10-CM | POA: Diagnosis not present

## 2023-08-04 DIAGNOSIS — E785 Hyperlipidemia, unspecified: Secondary | ICD-10-CM | POA: Diagnosis not present

## 2023-08-04 DIAGNOSIS — K5909 Other constipation: Secondary | ICD-10-CM | POA: Diagnosis not present

## 2023-08-04 DIAGNOSIS — J449 Chronic obstructive pulmonary disease, unspecified: Secondary | ICD-10-CM | POA: Diagnosis not present

## 2023-08-04 DIAGNOSIS — I69351 Hemiplegia and hemiparesis following cerebral infarction affecting right dominant side: Secondary | ICD-10-CM | POA: Diagnosis not present

## 2023-08-04 DIAGNOSIS — Z950 Presence of cardiac pacemaker: Secondary | ICD-10-CM | POA: Diagnosis not present

## 2023-08-04 DIAGNOSIS — Z87891 Personal history of nicotine dependence: Secondary | ICD-10-CM | POA: Diagnosis not present

## 2023-08-04 DIAGNOSIS — I69320 Aphasia following cerebral infarction: Secondary | ICD-10-CM | POA: Diagnosis not present

## 2023-08-04 DIAGNOSIS — I69391 Dysphagia following cerebral infarction: Secondary | ICD-10-CM | POA: Diagnosis not present

## 2023-08-04 NOTE — Telephone Encounter (Signed)
 Received 07/29/23 PT,OT orders from [facility]. Gave to Alyssa for signatures-[name]

## 2023-08-04 NOTE — Telephone Encounter (Signed)
 Gave verbal order  to [facility] speech therapy to [facility] [phone] for speech therapy 2 times a week for 2 weeks and 1 times a week for 2 weeks

## 2023-08-05 ENCOUNTER — Telehealth: Payer: Self-pay | Admitting: Nurse Practitioner

## 2023-08-05 DIAGNOSIS — I130 Hypertensive heart and chronic kidney disease with heart failure and stage 1 through stage 4 chronic kidney disease, or unspecified chronic kidney disease: Secondary | ICD-10-CM | POA: Diagnosis not present

## 2023-08-05 DIAGNOSIS — I4821 Permanent atrial fibrillation: Secondary | ICD-10-CM | POA: Diagnosis not present

## 2023-08-05 DIAGNOSIS — E1165 Type 2 diabetes mellitus with hyperglycemia: Secondary | ICD-10-CM | POA: Diagnosis not present

## 2023-08-05 DIAGNOSIS — N184 Chronic kidney disease, stage 4 (severe): Secondary | ICD-10-CM | POA: Diagnosis not present

## 2023-08-05 DIAGNOSIS — G4733 Obstructive sleep apnea (adult) (pediatric): Secondary | ICD-10-CM | POA: Diagnosis not present

## 2023-08-05 DIAGNOSIS — H9190 Unspecified hearing loss, unspecified ear: Secondary | ICD-10-CM | POA: Diagnosis not present

## 2023-08-05 DIAGNOSIS — J449 Chronic obstructive pulmonary disease, unspecified: Secondary | ICD-10-CM | POA: Diagnosis not present

## 2023-08-05 DIAGNOSIS — N3001 Acute cystitis with hematuria: Secondary | ICD-10-CM | POA: Diagnosis not present

## 2023-08-05 DIAGNOSIS — Z7984 Long term (current) use of oral hypoglycemic drugs: Secondary | ICD-10-CM | POA: Diagnosis not present

## 2023-08-05 DIAGNOSIS — I214 Non-ST elevation (NSTEMI) myocardial infarction: Secondary | ICD-10-CM | POA: Diagnosis not present

## 2023-08-05 DIAGNOSIS — R1312 Dysphagia, oropharyngeal phase: Secondary | ICD-10-CM | POA: Diagnosis not present

## 2023-08-05 DIAGNOSIS — I472 Ventricular tachycardia, unspecified: Secondary | ICD-10-CM | POA: Diagnosis not present

## 2023-08-05 DIAGNOSIS — K5909 Other constipation: Secondary | ICD-10-CM | POA: Diagnosis not present

## 2023-08-05 DIAGNOSIS — E785 Hyperlipidemia, unspecified: Secondary | ICD-10-CM | POA: Diagnosis not present

## 2023-08-05 DIAGNOSIS — I5033 Acute on chronic diastolic (congestive) heart failure: Secondary | ICD-10-CM | POA: Diagnosis not present

## 2023-08-05 DIAGNOSIS — I69320 Aphasia following cerebral infarction: Secondary | ICD-10-CM | POA: Diagnosis not present

## 2023-08-05 DIAGNOSIS — Z87891 Personal history of nicotine dependence: Secondary | ICD-10-CM | POA: Diagnosis not present

## 2023-08-05 DIAGNOSIS — I69391 Dysphagia following cerebral infarction: Secondary | ICD-10-CM | POA: Diagnosis not present

## 2023-08-05 DIAGNOSIS — I69351 Hemiplegia and hemiparesis following cerebral infarction affecting right dominant side: Secondary | ICD-10-CM | POA: Diagnosis not present

## 2023-08-05 DIAGNOSIS — I493 Ventricular premature depolarization: Secondary | ICD-10-CM | POA: Diagnosis not present

## 2023-08-05 DIAGNOSIS — E1122 Type 2 diabetes mellitus with diabetic chronic kidney disease: Secondary | ICD-10-CM | POA: Diagnosis not present

## 2023-08-05 DIAGNOSIS — Z950 Presence of cardiac pacemaker: Secondary | ICD-10-CM | POA: Diagnosis not present

## 2023-08-05 DIAGNOSIS — I69392 Facial weakness following cerebral infarction: Secondary | ICD-10-CM | POA: Diagnosis not present

## 2023-08-05 DIAGNOSIS — D509 Iron deficiency anemia, unspecified: Secondary | ICD-10-CM | POA: Diagnosis not present

## 2023-08-05 NOTE — Telephone Encounter (Signed)
 Received 08/04/23 ST orders from [facility]. Gave to Alyssa for signatures-[name]

## 2023-08-07 ENCOUNTER — Telehealth: Admitting: Nurse Practitioner

## 2023-08-07 ENCOUNTER — Telehealth: Payer: Self-pay

## 2023-08-07 ENCOUNTER — Encounter: Payer: Self-pay | Admitting: Nurse Practitioner

## 2023-08-07 VITALS — Resp 16 | Ht 72.0 in | Wt 211.0 lb

## 2023-08-07 DIAGNOSIS — E1169 Type 2 diabetes mellitus with other specified complication: Secondary | ICD-10-CM

## 2023-08-07 DIAGNOSIS — E1159 Type 2 diabetes mellitus with other circulatory complications: Secondary | ICD-10-CM

## 2023-08-07 DIAGNOSIS — I152 Hypertension secondary to endocrine disorders: Secondary | ICD-10-CM

## 2023-08-07 DIAGNOSIS — E785 Hyperlipidemia, unspecified: Secondary | ICD-10-CM | POA: Diagnosis not present

## 2023-08-07 DIAGNOSIS — I480 Paroxysmal atrial fibrillation: Secondary | ICD-10-CM | POA: Diagnosis not present

## 2023-08-07 DIAGNOSIS — N390 Urinary tract infection, site not specified: Secondary | ICD-10-CM | POA: Diagnosis not present

## 2023-08-07 MED ORDER — AMIODARONE HCL 200 MG PO TABS: 200.0000 mg | ORAL_TABLET | Freq: Every day | ORAL | 1 refills | Status: AC

## 2023-08-07 MED ORDER — CIPROFLOXACIN HCL 500 MG PO TABS: 500.0000 mg | ORAL_TABLET | Freq: Two times a day (BID) | ORAL | 0 refills | Status: AC

## 2023-08-07 MED ORDER — ATORVASTATIN CALCIUM 10 MG PO TABS: 10.0000 mg | ORAL_TABLET | Freq: Every day | ORAL | 1 refills | Status: AC

## 2023-08-07 MED ORDER — DAPAGLIFLOZIN PROPANEDIOL 10 MG PO TABS: 10.0000 mg | ORAL_TABLET | Freq: Every day | ORAL | 5 refills | Status: DC

## 2023-08-07 NOTE — Telephone Encounter (Signed)
 I spoke with the pt daughter n law. She states she do not know where his monitor is. I ordered the pt a new monitor and let her know that it will take 7-10 business days. I gave her the device clinic number to give us  a call back.

## 2023-08-07 NOTE — Telephone Encounter (Signed)
-----   Message from Suzann Riddle sent at 07/24/2023  4:01 PM EDT ----- Regarding: tx to [facility], not sure if [place] sent this to earlier, sorry if this is a duplicate  This patient request to transfer his device follow-up from [facility] to Dr. Cindie.  Can you assist his daughter-in-law Annabella with this process?  She is listed in his chart as of the emergency contact   Thank you, Suzann

## 2023-08-07 NOTE — Progress Notes (Signed)
 [facility] [address] [place], [place] [address]  Internal MEDICINE  Telephone Visit  Patient Name: [name]  [id number]  [id number]  Date of Service: 08/07/2023  I connected with the patient at 1040 by telephone and verified the patients identity using two identifiers.   I discussed the limitations, risks, security and privacy concerns of performing an evaluation and management service by telephone and the availability of in person appointments. I also discussed with the patient that there may be a patient responsible charge related to the service.  The patient expressed understanding and agrees to proceed.    Chief Complaint  Patient presents with   Telephone Screen    D/c from rehab   Telephone Assessment    HPI [name] presents for a telehealth virtual visit for follow up from hospital stay for history of recent stroke with residual deficits.  He was initially hospitalized on 06/23/23 to 06/27/23 for V-tach, NSTEMI, altered mental status and critical limb ischemia. A cardiac cath was done while he was in the hospital.  The day after he was discharged, on 06/28/23, he presented in the ED with stroke-like symptoms and was hospitalized for a stroke with his initial deficits being global aphasia and right sided paralysis. Right sided paralysis has improved to mostly right sided weakness.  He was discharged to SNF from [facility] on 07/11/23 and was recently discharged home from SNF after completing rehab.   Anticoagulation -- taking edoxaban  Diabetes -- taking farxiga , basal insulin  and ozempic  Going to speech therapy Continuing physical therapy, has unsteady gait   Current Medication: Outpatient Encounter Medications as of 08/07/2023  Medication Sig Note   ciprofloxacin  (CIPRO ) 500 MG tablet Take 1 tablet (500 mg total) by mouth 2 (two) times daily for 5 days. Take with food    albuterol  (VENTOLIN  HFA) 108 (90 Base) MCG/ACT inhaler Inhale 2 puffs into the lungs every 4  (four) hours as needed for wheezing or shortness of breath.    amiodarone  (PACERONE ) 200 MG tablet Take 1 tablet (200 mg total) by mouth daily.    atorvastatin  (LIPITOR) 10 MG tablet Take 1 tablet (10 mg total) by mouth daily.    Cholecalciferol (VITAMIN D-3 PO) Take 1 capsule by mouth daily.    dapagliflozin  propanediol (FARXIGA ) 10 MG TABS tablet Take 1 tablet (10 mg total) by mouth daily.    enzalutamide  (XTANDI ) 40 MG tablet Take 4 tablets (160 mg total) by mouth daily.    ferrous sulfate  (FEROSUL) 325 (65 FE) MG tablet Take 1 tablet by mouth daily.    furosemide  (LASIX ) 20 MG tablet Take 1 tablet (20 mg total) by mouth daily.    nitroGLYCERIN  (NITROSTAT ) 0.4 MG SL tablet Place 1 tablet (0.4 mg total) under the tongue every 5 (five) minutes as needed for chest pain.    omeprazole (PRILOSEC) 20 MG capsule Take 20 mg by mouth daily.    polyethylene glycol powder (GLYCOLAX /MIRALAX ) 17 GM/SCOOP powder Take 17 g by mouth daily as needed for mild constipation. Mix as directed.    Semaglutide ,0.25 or 0.5MG /DOS, (OZEMPIC , 0.25 OR 0.5 MG/DOSE,) 2 MG/3ML SOPN Inject 0.5 mg into the skin once a week. 06/29/2023: Monday's   Tiotropium Bromide  Monohydrate (SPIRIVA  RESPIMAT) 2.5 MCG/ACT AERS Inhale 2 puffs into the lungs daily.    verapamil  (CALAN -SR) 180 MG CR tablet Take 1 tablet (180 mg total) by mouth at bedtime.    vitamin B-12 (CYANOCOBALAMIN ) 1000 MCG tablet Take 1 tablet (1,000 mcg total) by mouth daily.    [  DISCONTINUED] amiodarone  (PACERONE ) 200 MG tablet Take 1 tablet (200 mg total) by mouth daily.    [DISCONTINUED] atorvastatin  (LIPITOR) 10 MG tablet Take 1 tablet (10 mg total) by mouth daily.    [DISCONTINUED] ciprofloxacin  (CIPRO ) 250 MG tablet Take 250 mg by mouth 2 (two) times daily. For 7 days    [DISCONTINUED] dapagliflozin  propanediol (FARXIGA ) 10 MG TABS tablet Take 1 tablet (10 mg total) by mouth daily.    No facility-administered encounter medications on file as of 08/07/2023.     Surgical History: Past Surgical History:  Procedure Laterality Date   CATARACT EXTRACTION     COLONOSCOPY WITH PROPOFOL  N/A 07/02/2019   Procedure: COLONOSCOPY WITH PROPOFOL ;  Surgeon: Unk Corinn Skiff, MD;  Location: [facility] ENDOSCOPY;  Service: Gastroenterology;  Laterality: N/A;   ESOPHAGOGASTRODUODENOSCOPY (EGD) WITH PROPOFOL  N/A 03/19/2022   Procedure: ESOPHAGOGASTRODUODENOSCOPY (EGD) WITH PROPOFOL ;  Surgeon: Therisa Bi, MD;  Location: [facility] ENDOSCOPY;  Service: Gastroenterology;  Laterality: N/A;   IR CT HEAD LTD  08/27/2019   IR CT HEAD LTD  06/28/2023   IR PERCUTANEOUS ART THROMBECTOMY/INFUSION INTRACRANIAL INC DIAG ANGIO  08/27/2019       IR PERCUTANEOUS ART THROMBECTOMY/INFUSION INTRACRANIAL INC DIAG ANGIO  08/27/2019   IR PERCUTANEOUS ART THROMBECTOMY/INFUSION INTRACRANIAL INC DIAG ANGIO  06/28/2023   LEFT HEART CATH AND CORONARY ANGIOGRAPHY N/A 06/24/2023   Procedure: LEFT HEART CATH AND CORONARY ANGIOGRAPHY;  Surgeon: Ammon Blunt, MD;  Location: [facility] INVASIVE CV LAB;  Service: Cardiovascular;  Laterality: N/A;   LOWER EXTREMITY ANGIOGRAPHY Left 12/25/2020   Procedure: Lower Extremity Angiography;  Surgeon: Marea Selinda RAMAN, MD;  Location: [facility] INVASIVE CV LAB;  Service: Cardiovascular;  Laterality: Left;   PACEMAKER LEADLESS INSERTION N/A 12/19/2020   Procedure: PACEMAKER LEADLESS INSERTION;  Surgeon: Ammon Blunt, MD;  Location: [facility] INVASIVE CV LAB;  Service: Cardiovascular;  Laterality: N/A;   PROSTATE CANCER     PROSTATE SURGERY     RADIOLOGY WITH ANESTHESIA N/A 08/27/2019   Procedure: IR WITH ANESTHESIA;  Surgeon: Dolphus Carrion, MD;  Location: [facility] OR;  Service: Radiology;  Laterality: N/A;   RADIOLOGY WITH ANESTHESIA N/A 06/28/2023   Procedure: RADIOLOGY WITH ANESTHESIA;  Surgeon: Radiologist, Medication, MD;  Location: [facility] OR;  Service: Radiology;  Laterality: N/A;    Medical History: Past Medical History:  Diagnosis Date   Acute deep vein thrombosis (DVT) of  femoral vein of left lower extremity (HCC)    Atrial fibrillation (HCC)    CHF (congestive heart failure) (HCC)    Chronic kidney disease    Diabetes (HCC)    Hearing loss    Hyperlipidemia    Liver cyst    Nephrolithiasis    OSA (obstructive sleep apnea)    Prostate cancer (HCC)    SSS (sick sinus syndrome) (HCC)    Stroke (HCC)    Valvular heart disease     Family History: Family History  Problem Relation Age of Onset   Colon cancer Mother    Diabetes Other    High blood pressure Other    Prostate cancer Brother    Diabetes Brother     Social History   Socioeconomic History   Marital status: Married    Spouse name: Leovardo Thoman   Number of children: 2   Years of education: 12   Highest education level: 12th grade  Occupational History   Occupation: [employer]  Tobacco Use   Smoking status: Former    Types: Cigarettes   Smokeless tobacco: Never  Advertising account planner  Vaping status: Never Used  Substance and Sexual Activity   Alcohol use: No   Drug use: No   Sexual activity: Not Currently  Other Topics Concern   Not on file  Social History Narrative   Not on file   Social Drivers of Health   Financial Resource Strain: Low Risk  (07/28/2023)   Overall Financial Resource Strain (CARDIA)    Difficulty of Paying Living Expenses: Not very hard  Food Insecurity: No Food Insecurity (07/28/2023)   Hunger Vital Sign    Worried About Running Out of Food in the Last Year: Never true    Ran Out of Food in the Last Year: Never true  Transportation Needs: No Transportation Needs (07/28/2023)   PRAPARE - Administrator, Civil Service (Medical): No    Lack of Transportation (Non-Medical): No  Physical Activity: Inactive (10/29/2019)   Exercise Vital Sign    Days of Exercise per Week: 0 days    Minutes of Exercise per Session: 0 min  Stress: No Stress Concern Present (10/29/2019)   [employer] of Occupational Health - Occupational Stress Questionnaire    Feeling  of Stress : Only a little  Recent Concern: Stress - Stress Concern Present (09/08/2019)   [employer] of Occupational Health - Occupational Stress Questionnaire    Feeling of Stress : To some extent  Social Connections: Moderately Integrated (06/23/2023)   Social Connection and Isolation Panel    Frequency of Communication with Friends and Family: More than three times a week    Frequency of Social Gatherings with Friends and Family: More than three times a week    Attends Religious Services: More than 4 times per year    Active Member of [employer] or Organizations: No    Attends Banker Meetings: Never    Marital Status: Married  Catering manager Violence: Not At Risk (06/23/2023)   Humiliation, Afraid, Rape, and Kick questionnaire    Fear of Current or Ex-Partner: No    Emotionally Abused: No    Physically Abused: No    Sexually Abused: No      Review of Systems  Constitutional:  Negative for chills, fatigue and unexpected weight change.  HENT:  Negative for congestion, postnasal drip, rhinorrhea, sneezing and sore throat.        Speech issues  Eyes:  Negative for redness.  Respiratory:  Negative for cough, chest tightness and shortness of breath.   Cardiovascular:  Negative for chest pain and palpitations.  Gastrointestinal:  Negative for abdominal pain, constipation, diarrhea, nausea and vomiting.  Genitourinary:  Negative for dysuria and frequency.  Musculoskeletal:  Negative for arthralgias, back pain, joint swelling and neck pain.  Skin:  Negative for rash.  Neurological: Negative.  Negative for tremors and numbness.  Hematological:  Negative for adenopathy. Does not bruise/bleed easily.  Psychiatric/Behavioral:  Positive for confusion. Negative for behavioral problems (Depression), sleep disturbance and suicidal ideas. The patient is not nervous/anxious.     Vital Signs: Resp 16   Ht 6' (1.829 m)   Wt 211 lb (95.7 kg)   BMI 28.62 kg/m     Observation/Objective: He is alert. No acute distress noted. Telehealth visit also conducted with the patient's daughter.     Assessment/Plan: 1. Type 2 diabetes mellitus with other specified complication, without long-term current use of insulin  (HCC) (Primary) Continue farxiga  as prescribed. Hold ozempic  and insulin  if glucose levels are stable.  - dapagliflozin  propanediol (FARXIGA ) 10 MG TABS tablet; Take 1 tablet (  10 mg total) by mouth daily.  Dispense: 30 tablet; Refill: 5  2. Hypertension associated with diabetes (HCC) Stable, continue medications as prescribed.   3. Hyperlipidemia associated with type 2 diabetes mellitus (HCC) Continue atorvastatin  as prescribed.  - atorvastatin  (LIPITOR) 10 MG tablet; Take 1 tablet (10 mg total) by mouth daily.  Dispense: 90 tablet; Refill: 1  4. Paroxysmal atrial fibrillation (HCC) Continue amiodarone  as prescribed.  - amiodarone  (PACERONE ) 200 MG tablet; Take 1 tablet (200 mg total) by mouth daily.  Dispense: 90 tablet; Refill: 1  5. Urinary tract infection without hematuria, site unspecified Cipro  prescribed, take until gone  - ciprofloxacin  (CIPRO ) 500 MG tablet; Take 1 tablet (500 mg total) by mouth 2 (two) times daily for 5 days. Take with food  Dispense: 10 tablet; Refill: 0    General Counseling: Verdie verbalizes understanding of the findings of today's phone visit and agrees with plan of treatment. I have discussed any further diagnostic evaluation that may be needed or ordered today. We also reviewed his medications today. he has been encouraged to call the office with any questions or concerns that should arise related to todays visit.  Return in about 8 weeks (around 10/01/2023) for F/U, Recheck A1C, Adilene Areola PCP.   No orders of the defined types were placed in this encounter.   Meds ordered this encounter  Medications   amiodarone  (PACERONE ) 200 MG tablet    Sig: Take 1 tablet (200 mg total) by mouth daily.     Dispense:  90 tablet    Refill:  1   dapagliflozin  propanediol (FARXIGA ) 10 MG TABS tablet    Sig: Take 1 tablet (10 mg total) by mouth daily.    Dispense:  30 tablet    Refill:  5   atorvastatin  (LIPITOR) 10 MG tablet    Sig: Take 1 tablet (10 mg total) by mouth daily.    Dispense:  90 tablet    Refill:  1   ciprofloxacin  (CIPRO ) 500 MG tablet    Sig: Take 1 tablet (500 mg total) by mouth 2 (two) times daily for 5 days. Take with food    Dispense:  10 tablet    Refill:  0    Time spent:30 Minutes Time spent with patient included reviewing progress notes, labs, imaging studies, and discussing plan for follow up.  [place] Controlled Substance Database was reviewed by me for overdose risk score (ORS) if appropriate.  This patient was seen by Mardy Maxin, FNP-C in collaboration with Dr. Sigrid Bathe as a part of collaborative care agreement.  Yuritzy Zehring R. Maxin, MSN, FNP-C Internal medicine

## 2023-08-08 DIAGNOSIS — N184 Chronic kidney disease, stage 4 (severe): Secondary | ICD-10-CM | POA: Diagnosis not present

## 2023-08-08 DIAGNOSIS — E1122 Type 2 diabetes mellitus with diabetic chronic kidney disease: Secondary | ICD-10-CM | POA: Diagnosis not present

## 2023-08-08 DIAGNOSIS — I69320 Aphasia following cerebral infarction: Secondary | ICD-10-CM | POA: Diagnosis not present

## 2023-08-08 DIAGNOSIS — N3001 Acute cystitis with hematuria: Secondary | ICD-10-CM | POA: Diagnosis not present

## 2023-08-08 DIAGNOSIS — D509 Iron deficiency anemia, unspecified: Secondary | ICD-10-CM | POA: Diagnosis not present

## 2023-08-08 DIAGNOSIS — H9190 Unspecified hearing loss, unspecified ear: Secondary | ICD-10-CM | POA: Diagnosis not present

## 2023-08-08 DIAGNOSIS — R1312 Dysphagia, oropharyngeal phase: Secondary | ICD-10-CM | POA: Diagnosis not present

## 2023-08-08 DIAGNOSIS — I69392 Facial weakness following cerebral infarction: Secondary | ICD-10-CM | POA: Diagnosis not present

## 2023-08-08 DIAGNOSIS — Z950 Presence of cardiac pacemaker: Secondary | ICD-10-CM | POA: Diagnosis not present

## 2023-08-08 DIAGNOSIS — E1165 Type 2 diabetes mellitus with hyperglycemia: Secondary | ICD-10-CM | POA: Diagnosis not present

## 2023-08-08 DIAGNOSIS — I69351 Hemiplegia and hemiparesis following cerebral infarction affecting right dominant side: Secondary | ICD-10-CM | POA: Diagnosis not present

## 2023-08-08 DIAGNOSIS — I4821 Permanent atrial fibrillation: Secondary | ICD-10-CM | POA: Diagnosis not present

## 2023-08-08 DIAGNOSIS — Z87891 Personal history of nicotine dependence: Secondary | ICD-10-CM | POA: Diagnosis not present

## 2023-08-08 DIAGNOSIS — I130 Hypertensive heart and chronic kidney disease with heart failure and stage 1 through stage 4 chronic kidney disease, or unspecified chronic kidney disease: Secondary | ICD-10-CM | POA: Diagnosis not present

## 2023-08-08 DIAGNOSIS — I5033 Acute on chronic diastolic (congestive) heart failure: Secondary | ICD-10-CM | POA: Diagnosis not present

## 2023-08-08 DIAGNOSIS — I214 Non-ST elevation (NSTEMI) myocardial infarction: Secondary | ICD-10-CM | POA: Diagnosis not present

## 2023-08-08 DIAGNOSIS — K5909 Other constipation: Secondary | ICD-10-CM | POA: Diagnosis not present

## 2023-08-08 DIAGNOSIS — I493 Ventricular premature depolarization: Secondary | ICD-10-CM | POA: Diagnosis not present

## 2023-08-08 DIAGNOSIS — Z7984 Long term (current) use of oral hypoglycemic drugs: Secondary | ICD-10-CM | POA: Diagnosis not present

## 2023-08-08 DIAGNOSIS — G4733 Obstructive sleep apnea (adult) (pediatric): Secondary | ICD-10-CM | POA: Diagnosis not present

## 2023-08-08 DIAGNOSIS — I472 Ventricular tachycardia, unspecified: Secondary | ICD-10-CM | POA: Diagnosis not present

## 2023-08-08 DIAGNOSIS — I69391 Dysphagia following cerebral infarction: Secondary | ICD-10-CM | POA: Diagnosis not present

## 2023-08-08 DIAGNOSIS — E785 Hyperlipidemia, unspecified: Secondary | ICD-10-CM | POA: Diagnosis not present

## 2023-08-08 DIAGNOSIS — J449 Chronic obstructive pulmonary disease, unspecified: Secondary | ICD-10-CM | POA: Diagnosis not present

## 2023-08-11 ENCOUNTER — Telehealth: Payer: Self-pay

## 2023-08-11 ENCOUNTER — Other Ambulatory Visit: Payer: Self-pay

## 2023-08-11 ENCOUNTER — Other Ambulatory Visit: Payer: Self-pay | Admitting: Oncology

## 2023-08-11 DIAGNOSIS — Z950 Presence of cardiac pacemaker: Secondary | ICD-10-CM | POA: Diagnosis not present

## 2023-08-11 DIAGNOSIS — R1312 Dysphagia, oropharyngeal phase: Secondary | ICD-10-CM | POA: Diagnosis not present

## 2023-08-11 DIAGNOSIS — J449 Chronic obstructive pulmonary disease, unspecified: Secondary | ICD-10-CM | POA: Diagnosis not present

## 2023-08-11 DIAGNOSIS — K5909 Other constipation: Secondary | ICD-10-CM | POA: Diagnosis not present

## 2023-08-11 DIAGNOSIS — C61 Malignant neoplasm of prostate: Secondary | ICD-10-CM

## 2023-08-11 DIAGNOSIS — I4821 Permanent atrial fibrillation: Secondary | ICD-10-CM | POA: Diagnosis not present

## 2023-08-11 DIAGNOSIS — I69392 Facial weakness following cerebral infarction: Secondary | ICD-10-CM | POA: Diagnosis not present

## 2023-08-11 DIAGNOSIS — N184 Chronic kidney disease, stage 4 (severe): Secondary | ICD-10-CM | POA: Diagnosis not present

## 2023-08-11 DIAGNOSIS — E785 Hyperlipidemia, unspecified: Secondary | ICD-10-CM | POA: Diagnosis not present

## 2023-08-11 DIAGNOSIS — I493 Ventricular premature depolarization: Secondary | ICD-10-CM | POA: Diagnosis not present

## 2023-08-11 DIAGNOSIS — E1165 Type 2 diabetes mellitus with hyperglycemia: Secondary | ICD-10-CM | POA: Diagnosis not present

## 2023-08-11 DIAGNOSIS — Z87891 Personal history of nicotine dependence: Secondary | ICD-10-CM | POA: Diagnosis not present

## 2023-08-11 DIAGNOSIS — I5033 Acute on chronic diastolic (congestive) heart failure: Secondary | ICD-10-CM | POA: Diagnosis not present

## 2023-08-11 DIAGNOSIS — H9190 Unspecified hearing loss, unspecified ear: Secondary | ICD-10-CM | POA: Diagnosis not present

## 2023-08-11 DIAGNOSIS — I69351 Hemiplegia and hemiparesis following cerebral infarction affecting right dominant side: Secondary | ICD-10-CM | POA: Diagnosis not present

## 2023-08-11 DIAGNOSIS — I69320 Aphasia following cerebral infarction: Secondary | ICD-10-CM | POA: Diagnosis not present

## 2023-08-11 DIAGNOSIS — I472 Ventricular tachycardia, unspecified: Secondary | ICD-10-CM | POA: Diagnosis not present

## 2023-08-11 DIAGNOSIS — I214 Non-ST elevation (NSTEMI) myocardial infarction: Secondary | ICD-10-CM | POA: Diagnosis not present

## 2023-08-11 DIAGNOSIS — I130 Hypertensive heart and chronic kidney disease with heart failure and stage 1 through stage 4 chronic kidney disease, or unspecified chronic kidney disease: Secondary | ICD-10-CM | POA: Diagnosis not present

## 2023-08-11 DIAGNOSIS — G4733 Obstructive sleep apnea (adult) (pediatric): Secondary | ICD-10-CM | POA: Diagnosis not present

## 2023-08-11 DIAGNOSIS — Z7984 Long term (current) use of oral hypoglycemic drugs: Secondary | ICD-10-CM | POA: Diagnosis not present

## 2023-08-11 DIAGNOSIS — I69391 Dysphagia following cerebral infarction: Secondary | ICD-10-CM | POA: Diagnosis not present

## 2023-08-11 DIAGNOSIS — D509 Iron deficiency anemia, unspecified: Secondary | ICD-10-CM | POA: Diagnosis not present

## 2023-08-11 DIAGNOSIS — N3001 Acute cystitis with hematuria: Secondary | ICD-10-CM | POA: Diagnosis not present

## 2023-08-11 DIAGNOSIS — E1122 Type 2 diabetes mellitus with diabetic chronic kidney disease: Secondary | ICD-10-CM | POA: Diagnosis not present

## 2023-08-11 NOTE — Telephone Encounter (Signed)
 Sari from [facility] called about patient blood pressure been low, 88/58 and 92/60, she stated pt has gotten higher reads but she isn't sure if they are checking it after he is walking, stated family is picking up blood pressure this morning, she ws advised she needs to contact his cardiologist.

## 2023-08-11 NOTE — Progress Notes (Signed)
 08/15/2023 Name: [name] MRN: [id number] DOB: 06/09/1933  Chief Complaint  Patient presents with   Diabetes    [employer]    [name] is a 89 y.o. year old male who presented for a telephone visit. Due to speech issues, the call was conducted with his daughter-in-law [name].   They were referred to the pharmacist by a quality report for assistance in managing diabetes.    Subjective: [name] is a 89 year old male with a PMH significant for CVA (June 2025), NSTEMI (June 2025), metastatic prostate cancer, T2DM, HFpEF, CKD 3b, HTN, HLD. Patient was recently seen by PCP last Wednesday d/t AMS and was started on ciprofloxacin  for UTI.   Care Team: Primary Care Provider: Liana Fish, NP ; Next Scheduled Visit: 09/29/2023 Cardiologist Next Scheduled Visit: 09/01/2023, 09/26/2023 Neurologist Next Scheduled Visit: 11/06/2023 Oncologist Next Scheduled Visit: 08/13/2023   Medication Access/Adherence  Current Pharmacy:  [id number] [place], [place] - [address] AT [facility] [address] [place] [address] [place] [address] Phone: [phone] Fax: [phone]  [place] [facility] [address] [place] [address] Phone: [phone] Fax: [phone]  [facility] [address] [place] [address] Phone: [phone] Fax: [phone]   Patient reports affordability concerns with their medications: No  Patient reports access/transportation concerns to their pharmacy: No  Daughter in law takes him to doctors appointments. Interested in services, but okay with holding off on services.  Patient reports adherence concerns with their medications:  No  His daughter-in-law stated that she fills the pillbox and patient is able to take his medications on his own.    Diabetes:  Current medications with questions/concerns:  Ozempic  remains on hold.  Tiffany reported that the patient was previously taking Ozempic  daily, which is why the dose had been reduced. Per the last encounter with the primary care provider, it was noted to continue holding Ozempic  as long as blood glucose levels remain within permissible limits. The patient is currently receiving medication assistance for Ozempic ; however, automatic refills are not available through the patient assistance program.  Tiffany shared that the patient experienced altered mental status (AMS) last week and initially suspected hypoglycemia as the cause. However, his blood glucose at the time was 86 mg/dL. During a recent primary care visit last Wednesday, the patient was diagnosed with a urinary tract infection (UTI). Since initiating ciprofloxacin , his mental status has improved, and there have been no clinically observed hypoglycemic events.  Tiffany also reported that the patient is no longer able to check his blood sugar daily as he once did. His wife is unable to assist due to her own dementia, and the patient does not currently receive home health services. We discussed the possibility of initiating a continuous glucose monitor (CGM) given these circumstances, but this is unlikely at this time given how well controlled his diabetes is. The family is not able to check his blood glucose daily. Although the patient does not have a smartphone, a CGM reader would be required if started. Tiffany expressed interest in learning how to track his blood sugar via an app on her smartphone.  In addition, Tiffany noted that the patient is unable to cook, and his wife, due to her cognitive status, is fearful of using the stove and cannot assist with meal preparation. Tiffany mentioned limited past contact with a cardiology social  worker, but follow-up did not occur as the family did not express needs at that time. She also stated that she reached out to Meals on Wheels but has not received a response. Currently, the  family provides and delivers meals to the patient and his wife. Glenn is now interested in being connected with a Child psychotherapist to assist in addressing ongoing care needs, including support for nutrition, blood glucose monitoring, and caregiver resources.      Objective:  Lab Results  Component Value Date   HGBA1C 6.9 (H) 06/29/2023    Lab Results  Component Value Date   CREATININE 2.49 (H) 08/13/2023   BUN 34 (H) 08/13/2023   NA 135 08/13/2023   K 4.0 08/13/2023   CL 101 08/13/2023   CO2 25 08/13/2023    Lab Results  Component Value Date   CHOL 83 06/29/2023   HDL 23 (L) 06/29/2023   LDLCALC 32 06/29/2023   TRIG 142 06/29/2023   CHOLHDL 3.6 06/29/2023    Medications Reviewed Today   Medications were not reviewed in this encounter       Assessment/Plan:   Diabetes: - Currently controlled - Will follow up on needs for social worker to help with obtaining Meals on Wheels and inquiry of home health - Will collaborate with pharmacy team and [employer] device rep to obtain information on CGM for this patient to see if he is a candidate. In the past insurance, general representatives have been ill equipped to provide information on patients with exceptions. If he is eligible will collaborate with PCP.  - Given patient recently had significant health complications and possible medication changes will defer assessment for compliance packaging.      Follow Up Plan: 2-4 weeks   Dorcas Solian, PharmD Clinical Pharmacist Cell: [phone]

## 2023-08-12 ENCOUNTER — Telehealth: Payer: Self-pay | Admitting: Nurse Practitioner

## 2023-08-12 DIAGNOSIS — I4821 Permanent atrial fibrillation: Secondary | ICD-10-CM | POA: Diagnosis not present

## 2023-08-12 DIAGNOSIS — I214 Non-ST elevation (NSTEMI) myocardial infarction: Secondary | ICD-10-CM | POA: Diagnosis not present

## 2023-08-12 DIAGNOSIS — I69392 Facial weakness following cerebral infarction: Secondary | ICD-10-CM | POA: Diagnosis not present

## 2023-08-12 DIAGNOSIS — E1165 Type 2 diabetes mellitus with hyperglycemia: Secondary | ICD-10-CM | POA: Diagnosis not present

## 2023-08-12 DIAGNOSIS — N3001 Acute cystitis with hematuria: Secondary | ICD-10-CM | POA: Diagnosis not present

## 2023-08-12 DIAGNOSIS — Z950 Presence of cardiac pacemaker: Secondary | ICD-10-CM | POA: Diagnosis not present

## 2023-08-12 DIAGNOSIS — N184 Chronic kidney disease, stage 4 (severe): Secondary | ICD-10-CM | POA: Diagnosis not present

## 2023-08-12 DIAGNOSIS — K5909 Other constipation: Secondary | ICD-10-CM | POA: Diagnosis not present

## 2023-08-12 DIAGNOSIS — I472 Ventricular tachycardia, unspecified: Secondary | ICD-10-CM | POA: Diagnosis not present

## 2023-08-12 DIAGNOSIS — D509 Iron deficiency anemia, unspecified: Secondary | ICD-10-CM | POA: Diagnosis not present

## 2023-08-12 DIAGNOSIS — I130 Hypertensive heart and chronic kidney disease with heart failure and stage 1 through stage 4 chronic kidney disease, or unspecified chronic kidney disease: Secondary | ICD-10-CM | POA: Diagnosis not present

## 2023-08-12 DIAGNOSIS — I5033 Acute on chronic diastolic (congestive) heart failure: Secondary | ICD-10-CM | POA: Diagnosis not present

## 2023-08-12 DIAGNOSIS — R1312 Dysphagia, oropharyngeal phase: Secondary | ICD-10-CM | POA: Diagnosis not present

## 2023-08-12 DIAGNOSIS — E785 Hyperlipidemia, unspecified: Secondary | ICD-10-CM | POA: Diagnosis not present

## 2023-08-12 DIAGNOSIS — I69320 Aphasia following cerebral infarction: Secondary | ICD-10-CM | POA: Diagnosis not present

## 2023-08-12 DIAGNOSIS — I69391 Dysphagia following cerebral infarction: Secondary | ICD-10-CM | POA: Diagnosis not present

## 2023-08-12 DIAGNOSIS — Z87891 Personal history of nicotine dependence: Secondary | ICD-10-CM | POA: Diagnosis not present

## 2023-08-12 DIAGNOSIS — J449 Chronic obstructive pulmonary disease, unspecified: Secondary | ICD-10-CM | POA: Diagnosis not present

## 2023-08-12 DIAGNOSIS — I69351 Hemiplegia and hemiparesis following cerebral infarction affecting right dominant side: Secondary | ICD-10-CM | POA: Diagnosis not present

## 2023-08-12 DIAGNOSIS — Z7984 Long term (current) use of oral hypoglycemic drugs: Secondary | ICD-10-CM | POA: Diagnosis not present

## 2023-08-12 DIAGNOSIS — I493 Ventricular premature depolarization: Secondary | ICD-10-CM | POA: Diagnosis not present

## 2023-08-12 DIAGNOSIS — H9190 Unspecified hearing loss, unspecified ear: Secondary | ICD-10-CM | POA: Diagnosis not present

## 2023-08-12 DIAGNOSIS — G4733 Obstructive sleep apnea (adult) (pediatric): Secondary | ICD-10-CM | POA: Diagnosis not present

## 2023-08-12 DIAGNOSIS — E1122 Type 2 diabetes mellitus with diabetic chronic kidney disease: Secondary | ICD-10-CM | POA: Diagnosis not present

## 2023-08-12 NOTE — Telephone Encounter (Signed)
 error

## 2023-08-13 ENCOUNTER — Inpatient Hospital Stay: Attending: Oncology

## 2023-08-13 ENCOUNTER — Inpatient Hospital Stay

## 2023-08-13 ENCOUNTER — Inpatient Hospital Stay (HOSPITAL_BASED_OUTPATIENT_CLINIC_OR_DEPARTMENT_OTHER): Admitting: Oncology

## 2023-08-13 ENCOUNTER — Encounter: Payer: Self-pay | Admitting: Oncology

## 2023-08-13 ENCOUNTER — Telehealth: Payer: Self-pay | Admitting: Licensed Clinical Social Worker

## 2023-08-13 VITALS — BP 95/66 | HR 73 | Resp 16 | Wt 214.0 lb

## 2023-08-13 DIAGNOSIS — Z79818 Long term (current) use of other agents affecting estrogen receptors and estrogen levels: Secondary | ICD-10-CM | POA: Diagnosis not present

## 2023-08-13 DIAGNOSIS — C772 Secondary and unspecified malignant neoplasm of intra-abdominal lymph nodes: Secondary | ICD-10-CM | POA: Diagnosis not present

## 2023-08-13 DIAGNOSIS — N184 Chronic kidney disease, stage 4 (severe): Secondary | ICD-10-CM | POA: Diagnosis not present

## 2023-08-13 DIAGNOSIS — I6389 Other cerebral infarction: Secondary | ICD-10-CM | POA: Diagnosis not present

## 2023-08-13 DIAGNOSIS — C61 Malignant neoplasm of prostate: Secondary | ICD-10-CM | POA: Diagnosis present

## 2023-08-13 DIAGNOSIS — Z5111 Encounter for antineoplastic chemotherapy: Secondary | ICD-10-CM | POA: Diagnosis not present

## 2023-08-13 DIAGNOSIS — I480 Paroxysmal atrial fibrillation: Secondary | ICD-10-CM | POA: Diagnosis not present

## 2023-08-13 LAB — CMP (CANCER CENTER ONLY)
ALT: 14 U/L (ref 0–44)
AST: 23 U/L (ref 15–41)
Albumin: 3.8 g/dL (ref 3.5–5.0)
Alkaline Phosphatase: 124 U/L (ref 38–126)
Anion gap: 9 (ref 5–15)
BUN: 34 mg/dL — ABNORMAL HIGH (ref 8–23)
CO2: 25 mmol/L (ref 22–32)
Calcium: 9.6 mg/dL (ref 8.9–10.3)
Chloride: 101 mmol/L (ref 98–111)
Creatinine: 2.49 mg/dL — ABNORMAL HIGH (ref 0.61–1.24)
GFR, Estimated: 24 mL/min — ABNORMAL LOW (ref >60)
Glucose, Bld: 90 mg/dL (ref 70–99)
Potassium: 4 mmol/L (ref 3.5–5.1)
Sodium: 135 mmol/L (ref 135–145)
Total Bilirubin: 0.8 mg/dL (ref 0.0–1.2)
Total Protein: 7.9 g/dL (ref 6.5–8.1)

## 2023-08-13 LAB — CBC WITH DIFFERENTIAL (CANCER CENTER ONLY)
Abs Immature Granulocytes: 0.01 K/uL (ref 0.00–0.07)
Basophils Absolute: 0 K/uL (ref 0.0–0.1)
Basophils Relative: 1 %
Eosinophils Absolute: 0.1 K/uL (ref 0.0–0.5)
Eosinophils Relative: 3 %
HCT: 48.6 % (ref 39.0–52.0)
Hemoglobin: 15.6 g/dL (ref 13.0–17.0)
Immature Granulocytes: 0 %
Lymphocytes Relative: 30 %
Lymphs Abs: 1.5 K/uL (ref 0.7–4.0)
MCH: 30.6 pg (ref 26.0–34.0)
MCHC: 32.1 g/dL (ref 30.0–36.0)
MCV: 95.3 fL (ref 80.0–100.0)
Monocytes Absolute: 0.8 K/uL (ref 0.1–1.0)
Monocytes Relative: 15 %
Neutro Abs: 2.5 K/uL (ref 1.7–7.7)
Neutrophils Relative %: 51 %
Platelet Count: 223 K/uL (ref 150–400)
RBC: 5.1 MIL/uL (ref 4.22–5.81)
RDW: 16.4 % — ABNORMAL HIGH (ref 11.5–15.5)
WBC Count: 5 K/uL (ref 4.0–10.5)
nRBC: 0 % (ref 0.0–0.2)

## 2023-08-13 LAB — PSA: Prostatic Specific Antigen: 0.83 ng/mL (ref 0.00–4.00)

## 2023-08-13 MED ORDER — LEUPROLIDE ACETATE (6 MONTH) 45 MG ~~LOC~~ KIT
45.0000 mg | PACK | Freq: Once | SUBCUTANEOUS | Status: AC
Start: 2023-08-13 — End: 2023-08-13
  Administered 2023-08-13: 45 mg via SUBCUTANEOUS
  Filled 2023-08-13: qty 45

## 2023-08-13 NOTE — Assessment & Plan Note (Addendum)
 Encourage oral hydration and avoid nephrotoxins.  Follow up with nephrology Creatinine function is getting worse.  Patient is on Lasix , possible overdiuresis.  Forward results to patient's primary care provider and cardiology.  Defer management to PCP and cardiology.

## 2023-08-13 NOTE — Assessment & Plan Note (Signed)
 Likely secondary to recent cardioversion.
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
## 2023-08-13 NOTE — Assessment & Plan Note (Signed)
 Currently on Edoxaban  [Savaysa ], follow up with cardiology.

## 2023-08-13 NOTE — Assessment & Plan Note (Signed)
Eligard 45 mg every 6 months.- Eligard today

## 2023-08-13 NOTE — Telephone Encounter (Signed)
 H&V Care Navigation CSW Progress Note  Clinical Social Worker contacted caregiver by phone to f/u on community resources, was able to reach daughter in law Tiffany this morning at [phone] (DPR on file). Introduced self, role, reason for call. Confirmed they received information sent to home address, she shares they are doing okay they are still interested in finding an in home aide for more daily tasks and meal prep assistance. I had included a list of in home personal care service options in packet sent, and encouraged daughter in law to look at those options and let me know if any issues making contact. Also reached out to [facility] for patient (daughter in law had also contacted them). Shared there may be a waitlist (in [place] there is a roughly 6 month waitlist currently). If I hear any additional options I will let her know.   Daughter in law also interested in information about blood glucose remote monitoring device discussed with pharmacy. I was able to reach out to Asajah, PharmD, and she will be back in touch with daughter in law regarding options for pt. Daughter in law called and updated with this information as well. No additional questions at this time.  Patient is participating in a Managed Medicaid Plan:  No, UHC medicare   SDOH Screenings   Food Insecurity: No Food Insecurity (07/28/2023)  Housing: Low Risk  (07/28/2023)  Transportation Needs: No Transportation Needs (07/28/2023)  Utilities: Not At Risk (07/28/2023)  Alcohol Screen: Low Risk  (10/26/2021)  Depression (PHQ2-9): Low Risk  (08/13/2023)  Financial Resource Strain: Low Risk  (07/28/2023)  Physical Activity: Inactive (10/29/2019)  Social Connections: Moderately Integrated (06/23/2023)  Stress: No Stress Concern Present (10/29/2019)  Recent Concern: Stress - Stress Concern Present (09/08/2019)  Tobacco Use: Medium Risk (08/13/2023)  Health Literacy: Inadequate Health Literacy (07/28/2023)    Marit Lark, MSW, LCSW Clinical Social Worker II [facility] Heart/Vascular Care Navigation  [phone]- work cell phone (preferred)

## 2023-08-13 NOTE — Assessment & Plan Note (Addendum)
 Prostate cancer recurrence.  Non- regional (retroperitoneal) lymphadenopathy -M1a Labs reviewed and discussed with patient.    Patient is on androgen deprivation therapy with Eligard  45 mg every 6 months-today and next due January 2026 Clinically he tolerates Xtandi  160mg  daily well. Continue current regimen

## 2023-08-14 ENCOUNTER — Other Ambulatory Visit: Payer: Self-pay | Admitting: Nurse Practitioner

## 2023-08-14 DIAGNOSIS — Z87891 Personal history of nicotine dependence: Secondary | ICD-10-CM | POA: Diagnosis not present

## 2023-08-14 DIAGNOSIS — I69320 Aphasia following cerebral infarction: Secondary | ICD-10-CM | POA: Diagnosis not present

## 2023-08-14 DIAGNOSIS — N184 Chronic kidney disease, stage 4 (severe): Secondary | ICD-10-CM | POA: Diagnosis not present

## 2023-08-14 DIAGNOSIS — E785 Hyperlipidemia, unspecified: Secondary | ICD-10-CM | POA: Diagnosis not present

## 2023-08-14 DIAGNOSIS — I69392 Facial weakness following cerebral infarction: Secondary | ICD-10-CM | POA: Diagnosis not present

## 2023-08-14 DIAGNOSIS — H9190 Unspecified hearing loss, unspecified ear: Secondary | ICD-10-CM | POA: Diagnosis not present

## 2023-08-14 DIAGNOSIS — E1165 Type 2 diabetes mellitus with hyperglycemia: Secondary | ICD-10-CM | POA: Diagnosis not present

## 2023-08-14 DIAGNOSIS — I130 Hypertensive heart and chronic kidney disease with heart failure and stage 1 through stage 4 chronic kidney disease, or unspecified chronic kidney disease: Secondary | ICD-10-CM | POA: Diagnosis not present

## 2023-08-14 DIAGNOSIS — G4733 Obstructive sleep apnea (adult) (pediatric): Secondary | ICD-10-CM | POA: Diagnosis not present

## 2023-08-14 DIAGNOSIS — I493 Ventricular premature depolarization: Secondary | ICD-10-CM | POA: Diagnosis not present

## 2023-08-14 DIAGNOSIS — K5909 Other constipation: Secondary | ICD-10-CM | POA: Diagnosis not present

## 2023-08-14 DIAGNOSIS — I472 Ventricular tachycardia, unspecified: Secondary | ICD-10-CM | POA: Diagnosis not present

## 2023-08-14 DIAGNOSIS — I69391 Dysphagia following cerebral infarction: Secondary | ICD-10-CM | POA: Diagnosis not present

## 2023-08-14 DIAGNOSIS — I214 Non-ST elevation (NSTEMI) myocardial infarction: Secondary | ICD-10-CM | POA: Diagnosis not present

## 2023-08-14 DIAGNOSIS — Z7984 Long term (current) use of oral hypoglycemic drugs: Secondary | ICD-10-CM | POA: Diagnosis not present

## 2023-08-14 DIAGNOSIS — I5033 Acute on chronic diastolic (congestive) heart failure: Secondary | ICD-10-CM | POA: Diagnosis not present

## 2023-08-14 DIAGNOSIS — D509 Iron deficiency anemia, unspecified: Secondary | ICD-10-CM | POA: Diagnosis not present

## 2023-08-14 DIAGNOSIS — E1122 Type 2 diabetes mellitus with diabetic chronic kidney disease: Secondary | ICD-10-CM | POA: Diagnosis not present

## 2023-08-14 DIAGNOSIS — Z950 Presence of cardiac pacemaker: Secondary | ICD-10-CM | POA: Diagnosis not present

## 2023-08-14 DIAGNOSIS — J449 Chronic obstructive pulmonary disease, unspecified: Secondary | ICD-10-CM | POA: Diagnosis not present

## 2023-08-14 DIAGNOSIS — N3001 Acute cystitis with hematuria: Secondary | ICD-10-CM | POA: Diagnosis not present

## 2023-08-14 DIAGNOSIS — I4821 Permanent atrial fibrillation: Secondary | ICD-10-CM | POA: Diagnosis not present

## 2023-08-14 DIAGNOSIS — I69351 Hemiplegia and hemiparesis following cerebral infarction affecting right dominant side: Secondary | ICD-10-CM | POA: Diagnosis not present

## 2023-08-14 DIAGNOSIS — R1312 Dysphagia, oropharyngeal phase: Secondary | ICD-10-CM | POA: Diagnosis not present

## 2023-08-14 NOTE — Telephone Encounter (Signed)
 H&V Care Navigation CSW Progress Note  Clinical Social Worker received a call from [employer], with [employer] [facility] [place] in response to my referral yesterday. She shared that currently they are on a waiting list for any additional grant funding but dont have any at this time to accommodate additional enrollees. They do have some space for private pay meals (~$6.75/meal), I provided her pt information, and contact for daughter in law to explain options. Remain available as needed.   Patient is participating in a Managed Medicaid Plan:  No, UHC Medicare only  SDOH Screenings   Food Insecurity: No Food Insecurity (07/28/2023)  Housing: Low Risk  (07/28/2023)  Transportation Needs: No Transportation Needs (07/28/2023)  Utilities: Not At Risk (07/28/2023)  Alcohol Screen: Low Risk  (10/26/2021)  Depression (PHQ2-9): Low Risk  (08/13/2023)  Financial Resource Strain: Low Risk  (07/28/2023)  Physical Activity: Inactive (10/29/2019)  Social Connections: Moderately Integrated (06/23/2023)  Stress: No Stress Concern Present (10/29/2019)  Recent Concern: Stress - Stress Concern Present (09/08/2019)  Tobacco Use: Medium Risk (08/13/2023)  Health Literacy: Inadequate Health Literacy (07/28/2023)   Marit Lark, MSW, LCSW Clinical Social Worker II [facility] Heart/Vascular Care Navigation  [phone]- work cell phone (preferred)

## 2023-08-15 ENCOUNTER — Telehealth: Payer: Self-pay | Admitting: Emergency Medicine

## 2023-08-15 ENCOUNTER — Encounter: Payer: Self-pay | Admitting: Nurse Practitioner

## 2023-08-15 DIAGNOSIS — Z87891 Personal history of nicotine dependence: Secondary | ICD-10-CM | POA: Diagnosis not present

## 2023-08-15 DIAGNOSIS — J449 Chronic obstructive pulmonary disease, unspecified: Secondary | ICD-10-CM | POA: Diagnosis not present

## 2023-08-15 DIAGNOSIS — Z7984 Long term (current) use of oral hypoglycemic drugs: Secondary | ICD-10-CM | POA: Diagnosis not present

## 2023-08-15 DIAGNOSIS — I69320 Aphasia following cerebral infarction: Secondary | ICD-10-CM | POA: Diagnosis not present

## 2023-08-15 DIAGNOSIS — I69391 Dysphagia following cerebral infarction: Secondary | ICD-10-CM | POA: Diagnosis not present

## 2023-08-15 DIAGNOSIS — E1122 Type 2 diabetes mellitus with diabetic chronic kidney disease: Secondary | ICD-10-CM | POA: Diagnosis not present

## 2023-08-15 DIAGNOSIS — H9190 Unspecified hearing loss, unspecified ear: Secondary | ICD-10-CM | POA: Diagnosis not present

## 2023-08-15 DIAGNOSIS — K5909 Other constipation: Secondary | ICD-10-CM | POA: Diagnosis not present

## 2023-08-15 DIAGNOSIS — I69392 Facial weakness following cerebral infarction: Secondary | ICD-10-CM | POA: Diagnosis not present

## 2023-08-15 DIAGNOSIS — D509 Iron deficiency anemia, unspecified: Secondary | ICD-10-CM | POA: Diagnosis not present

## 2023-08-15 DIAGNOSIS — I472 Ventricular tachycardia, unspecified: Secondary | ICD-10-CM | POA: Diagnosis not present

## 2023-08-15 DIAGNOSIS — R1312 Dysphagia, oropharyngeal phase: Secondary | ICD-10-CM | POA: Diagnosis not present

## 2023-08-15 DIAGNOSIS — G4733 Obstructive sleep apnea (adult) (pediatric): Secondary | ICD-10-CM | POA: Diagnosis not present

## 2023-08-15 DIAGNOSIS — I5033 Acute on chronic diastolic (congestive) heart failure: Secondary | ICD-10-CM | POA: Diagnosis not present

## 2023-08-15 DIAGNOSIS — I493 Ventricular premature depolarization: Secondary | ICD-10-CM | POA: Diagnosis not present

## 2023-08-15 DIAGNOSIS — N3001 Acute cystitis with hematuria: Secondary | ICD-10-CM | POA: Diagnosis not present

## 2023-08-15 DIAGNOSIS — Z950 Presence of cardiac pacemaker: Secondary | ICD-10-CM | POA: Diagnosis not present

## 2023-08-15 DIAGNOSIS — I130 Hypertensive heart and chronic kidney disease with heart failure and stage 1 through stage 4 chronic kidney disease, or unspecified chronic kidney disease: Secondary | ICD-10-CM | POA: Diagnosis not present

## 2023-08-15 DIAGNOSIS — N184 Chronic kidney disease, stage 4 (severe): Secondary | ICD-10-CM | POA: Diagnosis not present

## 2023-08-15 DIAGNOSIS — E785 Hyperlipidemia, unspecified: Secondary | ICD-10-CM | POA: Diagnosis not present

## 2023-08-15 DIAGNOSIS — E1165 Type 2 diabetes mellitus with hyperglycemia: Secondary | ICD-10-CM | POA: Diagnosis not present

## 2023-08-15 DIAGNOSIS — I214 Non-ST elevation (NSTEMI) myocardial infarction: Secondary | ICD-10-CM | POA: Diagnosis not present

## 2023-08-15 DIAGNOSIS — I4821 Permanent atrial fibrillation: Secondary | ICD-10-CM | POA: Diagnosis not present

## 2023-08-15 DIAGNOSIS — I69351 Hemiplegia and hemiparesis following cerebral infarction affecting right dominant side: Secondary | ICD-10-CM | POA: Diagnosis not present

## 2023-08-15 MED ORDER — VERAPAMIL HCL ER 120 MG PO TBCR: 120.0000 mg | EXTENDED_RELEASE_TABLET | Freq: Every day | ORAL | 3 refills | Status: AC

## 2023-08-15 NOTE — Telephone Encounter (Signed)
 Called and spoke with Tiffany.  Informed her of Suzann Riddle, NP's recommendation of reducing the Verapamil  to 120 mg daily.  Orders for new dosage put and sent to [facility] in [place].  Corresponding change made to patient's medication list.  Tiffany verbalized understanding with all questions and concerns addressed at this time.

## 2023-08-18 ENCOUNTER — Telehealth: Payer: Self-pay | Admitting: Nurse Practitioner

## 2023-08-18 DIAGNOSIS — G4733 Obstructive sleep apnea (adult) (pediatric): Secondary | ICD-10-CM | POA: Diagnosis not present

## 2023-08-18 DIAGNOSIS — D509 Iron deficiency anemia, unspecified: Secondary | ICD-10-CM | POA: Diagnosis not present

## 2023-08-18 DIAGNOSIS — I69391 Dysphagia following cerebral infarction: Secondary | ICD-10-CM | POA: Diagnosis not present

## 2023-08-18 DIAGNOSIS — I493 Ventricular premature depolarization: Secondary | ICD-10-CM | POA: Diagnosis not present

## 2023-08-18 DIAGNOSIS — I472 Ventricular tachycardia, unspecified: Secondary | ICD-10-CM | POA: Diagnosis not present

## 2023-08-18 DIAGNOSIS — E1165 Type 2 diabetes mellitus with hyperglycemia: Secondary | ICD-10-CM | POA: Diagnosis not present

## 2023-08-18 DIAGNOSIS — I4821 Permanent atrial fibrillation: Secondary | ICD-10-CM | POA: Diagnosis not present

## 2023-08-18 DIAGNOSIS — K5909 Other constipation: Secondary | ICD-10-CM | POA: Diagnosis not present

## 2023-08-18 DIAGNOSIS — Z7984 Long term (current) use of oral hypoglycemic drugs: Secondary | ICD-10-CM | POA: Diagnosis not present

## 2023-08-18 DIAGNOSIS — J449 Chronic obstructive pulmonary disease, unspecified: Secondary | ICD-10-CM | POA: Diagnosis not present

## 2023-08-18 DIAGNOSIS — I130 Hypertensive heart and chronic kidney disease with heart failure and stage 1 through stage 4 chronic kidney disease, or unspecified chronic kidney disease: Secondary | ICD-10-CM | POA: Diagnosis not present

## 2023-08-18 DIAGNOSIS — I69392 Facial weakness following cerebral infarction: Secondary | ICD-10-CM | POA: Diagnosis not present

## 2023-08-18 DIAGNOSIS — E1122 Type 2 diabetes mellitus with diabetic chronic kidney disease: Secondary | ICD-10-CM | POA: Diagnosis not present

## 2023-08-18 DIAGNOSIS — E785 Hyperlipidemia, unspecified: Secondary | ICD-10-CM | POA: Diagnosis not present

## 2023-08-18 DIAGNOSIS — R1312 Dysphagia, oropharyngeal phase: Secondary | ICD-10-CM | POA: Diagnosis not present

## 2023-08-18 DIAGNOSIS — Z87891 Personal history of nicotine dependence: Secondary | ICD-10-CM | POA: Diagnosis not present

## 2023-08-18 DIAGNOSIS — H9190 Unspecified hearing loss, unspecified ear: Secondary | ICD-10-CM | POA: Diagnosis not present

## 2023-08-18 DIAGNOSIS — I69351 Hemiplegia and hemiparesis following cerebral infarction affecting right dominant side: Secondary | ICD-10-CM | POA: Diagnosis not present

## 2023-08-18 DIAGNOSIS — I214 Non-ST elevation (NSTEMI) myocardial infarction: Secondary | ICD-10-CM | POA: Diagnosis not present

## 2023-08-18 DIAGNOSIS — I69320 Aphasia following cerebral infarction: Secondary | ICD-10-CM | POA: Diagnosis not present

## 2023-08-18 DIAGNOSIS — N184 Chronic kidney disease, stage 4 (severe): Secondary | ICD-10-CM | POA: Diagnosis not present

## 2023-08-18 DIAGNOSIS — I5033 Acute on chronic diastolic (congestive) heart failure: Secondary | ICD-10-CM | POA: Diagnosis not present

## 2023-08-18 DIAGNOSIS — N3001 Acute cystitis with hematuria: Secondary | ICD-10-CM | POA: Diagnosis not present

## 2023-08-18 DIAGNOSIS — Z950 Presence of cardiac pacemaker: Secondary | ICD-10-CM | POA: Diagnosis not present

## 2023-08-18 NOTE — Telephone Encounter (Signed)
 Received 08/14/23 OT orders from [facility]. Gave to Alyssa for signatures-[name]

## 2023-08-19 ENCOUNTER — Telehealth: Payer: Self-pay | Admitting: Nurse Practitioner

## 2023-08-19 NOTE — Telephone Encounter (Signed)
 07/29/23, 08/04/23, & 08/14/23 Orders faxed back to [facility]; [phone]. All scanned-[name]

## 2023-08-21 DIAGNOSIS — H9190 Unspecified hearing loss, unspecified ear: Secondary | ICD-10-CM | POA: Diagnosis not present

## 2023-08-21 DIAGNOSIS — N184 Chronic kidney disease, stage 4 (severe): Secondary | ICD-10-CM | POA: Diagnosis not present

## 2023-08-21 DIAGNOSIS — I69351 Hemiplegia and hemiparesis following cerebral infarction affecting right dominant side: Secondary | ICD-10-CM | POA: Diagnosis not present

## 2023-08-21 DIAGNOSIS — I69392 Facial weakness following cerebral infarction: Secondary | ICD-10-CM | POA: Diagnosis not present

## 2023-08-21 DIAGNOSIS — I4821 Permanent atrial fibrillation: Secondary | ICD-10-CM | POA: Diagnosis not present

## 2023-08-21 DIAGNOSIS — I69320 Aphasia following cerebral infarction: Secondary | ICD-10-CM | POA: Diagnosis not present

## 2023-08-21 DIAGNOSIS — N3001 Acute cystitis with hematuria: Secondary | ICD-10-CM | POA: Diagnosis not present

## 2023-08-21 DIAGNOSIS — Z950 Presence of cardiac pacemaker: Secondary | ICD-10-CM | POA: Diagnosis not present

## 2023-08-21 DIAGNOSIS — I493 Ventricular premature depolarization: Secondary | ICD-10-CM | POA: Diagnosis not present

## 2023-08-21 DIAGNOSIS — I472 Ventricular tachycardia, unspecified: Secondary | ICD-10-CM | POA: Diagnosis not present

## 2023-08-21 DIAGNOSIS — E785 Hyperlipidemia, unspecified: Secondary | ICD-10-CM | POA: Diagnosis not present

## 2023-08-21 DIAGNOSIS — J449 Chronic obstructive pulmonary disease, unspecified: Secondary | ICD-10-CM | POA: Diagnosis not present

## 2023-08-21 DIAGNOSIS — G4733 Obstructive sleep apnea (adult) (pediatric): Secondary | ICD-10-CM | POA: Diagnosis not present

## 2023-08-21 DIAGNOSIS — I5033 Acute on chronic diastolic (congestive) heart failure: Secondary | ICD-10-CM | POA: Diagnosis not present

## 2023-08-21 DIAGNOSIS — I130 Hypertensive heart and chronic kidney disease with heart failure and stage 1 through stage 4 chronic kidney disease, or unspecified chronic kidney disease: Secondary | ICD-10-CM | POA: Diagnosis not present

## 2023-08-21 DIAGNOSIS — K5909 Other constipation: Secondary | ICD-10-CM | POA: Diagnosis not present

## 2023-08-21 DIAGNOSIS — I214 Non-ST elevation (NSTEMI) myocardial infarction: Secondary | ICD-10-CM | POA: Diagnosis not present

## 2023-08-21 DIAGNOSIS — R1312 Dysphagia, oropharyngeal phase: Secondary | ICD-10-CM | POA: Diagnosis not present

## 2023-08-21 DIAGNOSIS — I69391 Dysphagia following cerebral infarction: Secondary | ICD-10-CM | POA: Diagnosis not present

## 2023-08-21 DIAGNOSIS — Z7984 Long term (current) use of oral hypoglycemic drugs: Secondary | ICD-10-CM | POA: Diagnosis not present

## 2023-08-21 DIAGNOSIS — D509 Iron deficiency anemia, unspecified: Secondary | ICD-10-CM | POA: Diagnosis not present

## 2023-08-21 DIAGNOSIS — E1165 Type 2 diabetes mellitus with hyperglycemia: Secondary | ICD-10-CM | POA: Diagnosis not present

## 2023-08-21 DIAGNOSIS — Z87891 Personal history of nicotine dependence: Secondary | ICD-10-CM | POA: Diagnosis not present

## 2023-08-21 DIAGNOSIS — E1122 Type 2 diabetes mellitus with diabetic chronic kidney disease: Secondary | ICD-10-CM | POA: Diagnosis not present

## 2023-08-22 ENCOUNTER — Telehealth: Payer: Self-pay

## 2023-08-22 NOTE — Telephone Encounter (Signed)
 Gave verbal to nathanel [phone] from [facility] for speech therapy  2 times a week for 4 weeks and 1 times a week for 1 week

## 2023-08-25 ENCOUNTER — Telehealth: Payer: Self-pay

## 2023-08-25 ENCOUNTER — Telehealth: Payer: Self-pay | Admitting: Nurse Practitioner

## 2023-08-25 NOTE — Telephone Encounter (Signed)
 Received 08/22/23 OT /ST orders from [facility]. Gave to Alyssa for signature-[name]

## 2023-08-25 NOTE — Progress Notes (Signed)
   08/25/2023  Patient ID: [name], male   DOB: Sep 03, 1933, 89 y.o.   MRN: [id number]  Update:   Spoke with Tiffany regarding the status of CGM. No response has been received yet from the PCP regarding sample availability. Discussed the potential use of biosensors as an interim solution, especially if a home health nurse is not available yet. If a biosensor is used, [name]'s blood glucose should be periodically checked throughout the week. Informed her that the biosensors are intended for patients not on insulin  therapy and does not have alert/detection capability for blood glucose levels below 70 mg/dL. The biosenors require use of a smartphone within range and readings are not shareable through an app.    Dorcas Solian, PharmD Clinical Pharmacist Cell: [phone]

## 2023-08-26 ENCOUNTER — Other Ambulatory Visit: Payer: Self-pay

## 2023-08-26 DIAGNOSIS — I130 Hypertensive heart and chronic kidney disease with heart failure and stage 1 through stage 4 chronic kidney disease, or unspecified chronic kidney disease: Secondary | ICD-10-CM | POA: Diagnosis not present

## 2023-08-26 DIAGNOSIS — I472 Ventricular tachycardia, unspecified: Secondary | ICD-10-CM | POA: Diagnosis not present

## 2023-08-26 DIAGNOSIS — I493 Ventricular premature depolarization: Secondary | ICD-10-CM | POA: Diagnosis not present

## 2023-08-26 DIAGNOSIS — I4821 Permanent atrial fibrillation: Secondary | ICD-10-CM | POA: Diagnosis not present

## 2023-08-26 DIAGNOSIS — I69351 Hemiplegia and hemiparesis following cerebral infarction affecting right dominant side: Secondary | ICD-10-CM | POA: Diagnosis not present

## 2023-08-26 DIAGNOSIS — I5033 Acute on chronic diastolic (congestive) heart failure: Secondary | ICD-10-CM | POA: Diagnosis not present

## 2023-08-26 DIAGNOSIS — G4733 Obstructive sleep apnea (adult) (pediatric): Secondary | ICD-10-CM | POA: Diagnosis not present

## 2023-08-26 DIAGNOSIS — Z950 Presence of cardiac pacemaker: Secondary | ICD-10-CM | POA: Diagnosis not present

## 2023-08-26 DIAGNOSIS — N184 Chronic kidney disease, stage 4 (severe): Secondary | ICD-10-CM | POA: Diagnosis not present

## 2023-08-26 DIAGNOSIS — H9190 Unspecified hearing loss, unspecified ear: Secondary | ICD-10-CM | POA: Diagnosis not present

## 2023-08-26 DIAGNOSIS — E785 Hyperlipidemia, unspecified: Secondary | ICD-10-CM | POA: Diagnosis not present

## 2023-08-26 DIAGNOSIS — E1165 Type 2 diabetes mellitus with hyperglycemia: Secondary | ICD-10-CM | POA: Diagnosis not present

## 2023-08-26 DIAGNOSIS — I214 Non-ST elevation (NSTEMI) myocardial infarction: Secondary | ICD-10-CM | POA: Diagnosis not present

## 2023-08-26 DIAGNOSIS — E1122 Type 2 diabetes mellitus with diabetic chronic kidney disease: Secondary | ICD-10-CM | POA: Diagnosis not present

## 2023-08-26 DIAGNOSIS — I69392 Facial weakness following cerebral infarction: Secondary | ICD-10-CM | POA: Diagnosis not present

## 2023-08-26 DIAGNOSIS — Z87891 Personal history of nicotine dependence: Secondary | ICD-10-CM | POA: Diagnosis not present

## 2023-08-26 DIAGNOSIS — R1312 Dysphagia, oropharyngeal phase: Secondary | ICD-10-CM | POA: Diagnosis not present

## 2023-08-26 DIAGNOSIS — K5909 Other constipation: Secondary | ICD-10-CM | POA: Diagnosis not present

## 2023-08-26 DIAGNOSIS — N3001 Acute cystitis with hematuria: Secondary | ICD-10-CM | POA: Diagnosis not present

## 2023-08-26 DIAGNOSIS — I69320 Aphasia following cerebral infarction: Secondary | ICD-10-CM | POA: Diagnosis not present

## 2023-08-26 DIAGNOSIS — Z7984 Long term (current) use of oral hypoglycemic drugs: Secondary | ICD-10-CM | POA: Diagnosis not present

## 2023-08-26 DIAGNOSIS — J449 Chronic obstructive pulmonary disease, unspecified: Secondary | ICD-10-CM | POA: Diagnosis not present

## 2023-08-26 DIAGNOSIS — I69391 Dysphagia following cerebral infarction: Secondary | ICD-10-CM | POA: Diagnosis not present

## 2023-08-26 DIAGNOSIS — D509 Iron deficiency anemia, unspecified: Secondary | ICD-10-CM | POA: Diagnosis not present

## 2023-08-28 DIAGNOSIS — E1165 Type 2 diabetes mellitus with hyperglycemia: Secondary | ICD-10-CM | POA: Diagnosis not present

## 2023-08-28 DIAGNOSIS — Z87891 Personal history of nicotine dependence: Secondary | ICD-10-CM | POA: Diagnosis not present

## 2023-08-28 DIAGNOSIS — E785 Hyperlipidemia, unspecified: Secondary | ICD-10-CM | POA: Diagnosis not present

## 2023-08-28 DIAGNOSIS — I69320 Aphasia following cerebral infarction: Secondary | ICD-10-CM | POA: Diagnosis not present

## 2023-08-28 DIAGNOSIS — I4821 Permanent atrial fibrillation: Secondary | ICD-10-CM | POA: Diagnosis not present

## 2023-08-28 DIAGNOSIS — I69351 Hemiplegia and hemiparesis following cerebral infarction affecting right dominant side: Secondary | ICD-10-CM | POA: Diagnosis not present

## 2023-08-28 DIAGNOSIS — Z950 Presence of cardiac pacemaker: Secondary | ICD-10-CM | POA: Diagnosis not present

## 2023-08-28 DIAGNOSIS — Z7984 Long term (current) use of oral hypoglycemic drugs: Secondary | ICD-10-CM | POA: Diagnosis not present

## 2023-08-28 DIAGNOSIS — I214 Non-ST elevation (NSTEMI) myocardial infarction: Secondary | ICD-10-CM | POA: Diagnosis not present

## 2023-08-28 DIAGNOSIS — D509 Iron deficiency anemia, unspecified: Secondary | ICD-10-CM | POA: Diagnosis not present

## 2023-08-28 DIAGNOSIS — I472 Ventricular tachycardia, unspecified: Secondary | ICD-10-CM | POA: Diagnosis not present

## 2023-08-28 DIAGNOSIS — N3001 Acute cystitis with hematuria: Secondary | ICD-10-CM | POA: Diagnosis not present

## 2023-08-28 DIAGNOSIS — I69391 Dysphagia following cerebral infarction: Secondary | ICD-10-CM | POA: Diagnosis not present

## 2023-08-28 DIAGNOSIS — J449 Chronic obstructive pulmonary disease, unspecified: Secondary | ICD-10-CM | POA: Diagnosis not present

## 2023-08-28 DIAGNOSIS — H9190 Unspecified hearing loss, unspecified ear: Secondary | ICD-10-CM | POA: Diagnosis not present

## 2023-08-28 DIAGNOSIS — I69392 Facial weakness following cerebral infarction: Secondary | ICD-10-CM | POA: Diagnosis not present

## 2023-08-28 DIAGNOSIS — I130 Hypertensive heart and chronic kidney disease with heart failure and stage 1 through stage 4 chronic kidney disease, or unspecified chronic kidney disease: Secondary | ICD-10-CM | POA: Diagnosis not present

## 2023-08-28 DIAGNOSIS — I5033 Acute on chronic diastolic (congestive) heart failure: Secondary | ICD-10-CM | POA: Diagnosis not present

## 2023-08-28 DIAGNOSIS — K5909 Other constipation: Secondary | ICD-10-CM | POA: Diagnosis not present

## 2023-08-28 DIAGNOSIS — I493 Ventricular premature depolarization: Secondary | ICD-10-CM | POA: Diagnosis not present

## 2023-08-28 DIAGNOSIS — R1312 Dysphagia, oropharyngeal phase: Secondary | ICD-10-CM | POA: Diagnosis not present

## 2023-08-28 DIAGNOSIS — N184 Chronic kidney disease, stage 4 (severe): Secondary | ICD-10-CM | POA: Diagnosis not present

## 2023-08-28 DIAGNOSIS — G4733 Obstructive sleep apnea (adult) (pediatric): Secondary | ICD-10-CM | POA: Diagnosis not present

## 2023-09-01 ENCOUNTER — Ambulatory Visit: Attending: Cardiology | Admitting: Cardiology

## 2023-09-01 ENCOUNTER — Encounter: Payer: Self-pay | Admitting: Cardiology

## 2023-09-01 VITALS — BP 118/74 | HR 103 | Ht 73.0 in | Wt 218.2 lb

## 2023-09-01 DIAGNOSIS — Z950 Presence of cardiac pacemaker: Secondary | ICD-10-CM

## 2023-09-01 DIAGNOSIS — I4821 Permanent atrial fibrillation: Secondary | ICD-10-CM

## 2023-09-01 DIAGNOSIS — I472 Ventricular tachycardia, unspecified: Secondary | ICD-10-CM

## 2023-09-01 DIAGNOSIS — I10 Essential (primary) hypertension: Secondary | ICD-10-CM | POA: Diagnosis not present

## 2023-09-01 NOTE — Patient Instructions (Signed)
 Medication Instructions:  Your physician recommends that you continue on your current medications as directed. Please refer to the Current Medication list given to you today.  *If you need a refill on your cardiac medications before your next appointment, please call your pharmacy*  Lab Work: No labs ordered today  If you have labs (blood work) drawn today and your tests are completely normal, you will receive your results only by: MyChart Message (if you have MyChart) OR A paper copy in the mail If you have any lab test that is abnormal or we need to change your treatment, we will call you to review the results.  Testing/Procedures: No test ordered today   Follow-Up: At [facility], you and your health needs are our priority.  As part of our continuing mission to provide you with exceptional heart care, our providers are all part of one team.  This team includes your primary Cardiologist (physician) and Advanced Practice Providers or APPs (Physician Assistants and Nurse Practitioners) who all work together to provide you with the care you need, when you need it.  Your next appointment:   1 year(s)  Provider:   You may see Redell Cave, MD or one of the following Advanced Practice Providers on your designated Care Team:   Lonni Meager, NP Lesley Maffucci, PA-C Bernardino Bring, PA-C Cadence Pitts, PA-C Tylene Lunch, NP Barnie Hila, NP

## 2023-09-01 NOTE — Progress Notes (Signed)
 Cardiology Office Note:    Date:  09/01/2023   ID:  [name], DOB 02-01-1933, MRN [id number]  PCP:  Liana Fish, NP   [facility] Providers Cardiologist:  Redell Cave, MD Electrophysiologist:  OLE ONEIDA HOLTS, MD     Referring MD: Liana Fish, NP   Chief Complaint  Patient presents with   Follow-up    6 week  follow up post Cath ,pt has been doing well with no complaints of chest pain, chest pressure or SOB, medciation reviewed verbally with patient    History of Present Illness:    [name] is a 89 y.o. male with a hx of permanent A-fib, hypertension, CKD 4, DVT, sss s/p leadless pacemaker 11/2020, CVA 06/2023, VT on amiodarone , metastatic prostate cancer on chemo, abdominal hernia who presents for follow-up.  Doing okay, recent hospitalizations after CVA occurring in the context of holding edoxaban  for left heart cath.  Currently undergoing occupational therapy and some physical therapy at home.  Compliant with medications as prescribed.  Family states patient not compliant with assistive walking devices.  Otherwise no concerns at this time.  Prior notes/testing Echo 6/25 EF 45 to 50% LHC 06/2023 40% proximal LAD Outside echo 2022 EF 50% ermanent atrial fibrillation, previously on Eliquis , switched to edoxaban  due to concerns for Eliquis  interacting with prostate cancer treatment  Past Medical History:  Diagnosis Date   Acute deep vein thrombosis (DVT) of femoral vein of left lower extremity (HCC)    Atrial fibrillation (HCC)    CHF (congestive heart failure) (HCC)    Chronic kidney disease    Diabetes (HCC)    Hearing loss    Hyperlipidemia    Liver cyst    Nephrolithiasis    OSA (obstructive sleep apnea)    Prostate cancer (HCC)    SSS (sick sinus syndrome) (HCC)    Stroke (HCC)    Valvular heart disease     Past Surgical History:  Procedure Laterality Date   CATARACT EXTRACTION     COLONOSCOPY WITH PROPOFOL  N/A  07/02/2019   Procedure: COLONOSCOPY WITH PROPOFOL ;  Surgeon: Unk Corinn Skiff, MD;  Location: [facility] ENDOSCOPY;  Service: Gastroenterology;  Laterality: N/A;   ESOPHAGOGASTRODUODENOSCOPY (EGD) WITH PROPOFOL  N/A 03/19/2022   Procedure: ESOPHAGOGASTRODUODENOSCOPY (EGD) WITH PROPOFOL ;  Surgeon: Therisa Bi, MD;  Location: [facility] ENDOSCOPY;  Service: Gastroenterology;  Laterality: N/A;   IR CT HEAD LTD  08/27/2019   IR CT HEAD LTD  06/28/2023   IR PERCUTANEOUS ART THROMBECTOMY/INFUSION INTRACRANIAL INC DIAG ANGIO  08/27/2019       IR PERCUTANEOUS ART THROMBECTOMY/INFUSION INTRACRANIAL INC DIAG ANGIO  08/27/2019   IR PERCUTANEOUS ART THROMBECTOMY/INFUSION INTRACRANIAL INC DIAG ANGIO  06/28/2023   LEFT HEART CATH AND CORONARY ANGIOGRAPHY N/A 06/24/2023   Procedure: LEFT HEART CATH AND CORONARY ANGIOGRAPHY;  Surgeon: Ammon Blunt, MD;  Location: [facility] INVASIVE CV LAB;  Service: Cardiovascular;  Laterality: N/A;   LOWER EXTREMITY ANGIOGRAPHY Left 12/25/2020   Procedure: Lower Extremity Angiography;  Surgeon: Marea Selinda RAMAN, MD;  Location: [facility] INVASIVE CV LAB;  Service: Cardiovascular;  Laterality: Left;   PACEMAKER LEADLESS INSERTION N/A 12/19/2020   Procedure: PACEMAKER LEADLESS INSERTION;  Surgeon: Ammon Blunt, MD;  Location: [facility] INVASIVE CV LAB;  Service: Cardiovascular;  Laterality: N/A;   PROSTATE CANCER     PROSTATE SURGERY     RADIOLOGY WITH ANESTHESIA N/A 08/27/2019   Procedure: IR WITH ANESTHESIA;  Surgeon: Dolphus Carrion, MD;  Location: [facility] OR;  Service: Radiology;  Laterality: N/A;  RADIOLOGY WITH ANESTHESIA N/A 06/28/2023   Procedure: RADIOLOGY WITH ANESTHESIA;  Surgeon: Radiologist, Medication, MD;  Location: [facility] OR;  Service: Radiology;  Laterality: N/A;    Current Medications: Current Meds  Medication Sig   albuterol  (VENTOLIN  HFA) 108 (90 Base) MCG/ACT inhaler Inhale 2 puffs into the lungs every 4 (four) hours as needed for wheezing or shortness of breath.   amiodarone  (PACERONE )  200 MG tablet Take 1 tablet (200 mg total) by mouth daily.   atorvastatin  (LIPITOR) 10 MG tablet Take 1 tablet (10 mg total) by mouth daily.   Cholecalciferol (VITAMIN D-3 PO) Take 1 capsule by mouth daily.   dapagliflozin  propanediol (FARXIGA ) 10 MG TABS tablet Take 1 tablet (10 mg total) by mouth daily.   edoxaban  (SAVAYSA ) 30 MG TABS tablet Take 30 mg by mouth daily.   enzalutamide  (XTANDI ) 40 MG tablet Take 4 tablets (160 mg total) by mouth daily.   ferrous sulfate  (FEROSUL) 325 (65 FE) MG tablet Take 1 tablet by mouth daily.   furosemide  (LASIX ) 20 MG tablet Take 1 tablet (20 mg total) by mouth daily.   nitroGLYCERIN  (NITROSTAT ) 0.4 MG SL tablet Place 1 tablet (0.4 mg total) under the tongue every 5 (five) minutes as needed for chest pain.   omeprazole (PRILOSEC) 20 MG capsule Take 20 mg by mouth daily.   polyethylene glycol powder (GLYCOLAX /MIRALAX ) 17 GM/SCOOP powder Take 17 g by mouth daily as needed for mild constipation. Mix as directed.   Semaglutide ,0.25 or 0.5MG /DOS, (OZEMPIC , 0.25 OR 0.5 MG/DOSE,) 2 MG/3ML SOPN Inject 0.5 mg into the skin once a week.   Tiotropium Bromide  Monohydrate (SPIRIVA  RESPIMAT) 2.5 MCG/ACT AERS Inhale 2 puffs into the lungs daily.   verapamil  (CALAN -SR) 120 MG CR tablet Take 1 tablet (120 mg total) by mouth at bedtime.   vitamin B-12 (CYANOCOBALAMIN ) 1000 MCG tablet Take 1 tablet (1,000 mcg total) by mouth daily.     Allergies:   Patient has no known allergies.   Social History   Socioeconomic History   Marital status: Married    Spouse name: Avrom Robarts   Number of children: 2   Years of education: 12   Highest education level: 12th grade  Occupational History   Occupation: [employer]  Tobacco Use   Smoking status: Former    Types: Cigarettes   Smokeless tobacco: Never  Vaping Use   Vaping status: Never Used  Substance and Sexual Activity   Alcohol use: No   Drug use: No   Sexual activity: Not Currently  Other Topics Concern   Not on  file  Social History Narrative   Not on file   Social Drivers of Health   Financial Resource Strain: Low Risk  (07/28/2023)   Overall Financial Resource Strain (CARDIA)    Difficulty of Paying Living Expenses: Not very hard  Food Insecurity: No Food Insecurity (07/28/2023)   Hunger Vital Sign    Worried About Running Out of Food in the Last Year: Never true    Ran Out of Food in the Last Year: Never true  Transportation Needs: No Transportation Needs (07/28/2023)   PRAPARE - Administrator, Civil Service (Medical): No    Lack of Transportation (Non-Medical): No  Physical Activity: Inactive (10/29/2019)   Exercise Vital Sign    Days of Exercise per Week: 0 days    Minutes of Exercise per Session: 0 min  Stress: No Stress Concern Present (10/29/2019)   [employer] of Occupational Health - Occupational Stress Questionnaire  Feeling of Stress : Only a little  Recent Concern: Stress - Stress Concern Present (09/08/2019)   [employer] of Occupational Health - Occupational Stress Questionnaire    Feeling of Stress : To some extent  Social Connections: Moderately Integrated (06/23/2023)   Social Connection and Isolation Panel    Frequency of Communication with Friends and Family: More than three times a week    Frequency of Social Gatherings with Friends and Family: More than three times a week    Attends Religious Services: More than 4 times per year    Active Member of [employer] or Organizations: No    Attends Engineer, structural: Never    Marital Status: Married     Family History: The patient's family history includes Colon cancer in his mother; Diabetes in his brother and another family member; High blood pressure in an other family member; Prostate cancer in his brother.  ROS:   Please see the history of present illness.     All other systems reviewed and are negative.  EKGs/Labs/Other Studies Reviewed:    The following studies were reviewed today: EKG  Interpretation Date/Time:  Monday September 01 2023 11:20:36 EDT Ventricular Rate:  103 PR Interval:    QRS Duration:  142 QT Interval:  406 QTC Calculation: 531 R Axis:   -80  Text Interpretation: Atrial fibrillation with rapid ventricular response with occasional ventricular-paced complexes and with premature ventricular or aberrantly conducted complexes Right bundle branch block Left anterior fascicular block Bifascicular block Confirmed by Darliss Rogue ([id number]) on 09/01/2023 11:30:50 AM    Recent Labs: 06/23/2023: B Natriuretic Peptide 495.0 06/24/2023: TSH 1.983 07/01/2023: Magnesium  2.1 08/13/2023: ALT 14; BUN 34; Creatinine 2.49; Hemoglobin 15.6; Platelet Count 223; Potassium 4.0; Sodium 135  Recent Lipid Panel    Component Value Date/Time   CHOL 83 06/29/2023 0459   CHOL 97 (L) 06/11/2019 0953   CHOL 140 10/12/2013 0732   TRIG 142 06/29/2023 0459   TRIG 139 10/12/2013 0732   HDL 23 (L) 06/29/2023 0459   HDL 47 06/11/2019 0953   HDL 43 10/12/2013 0732   CHOLHDL 3.6 06/29/2023 0459   VLDL 28 06/29/2023 0459   VLDL 28 10/12/2013 0732   LDLCALC 32 06/29/2023 0459   LDLCALC 34 06/11/2019 0953   LDLCALC 69 10/12/2013 0732     Risk Assessment/Calculations:              Physical Exam:    VS:  BP 118/74 (BP Location: Left Arm, Patient Position: Sitting, Cuff Size: Normal)   Pulse (!) 103   Ht 6' 1 (1.854 m)   Wt 218 lb 3.2 oz (99 kg)   SpO2 93%   BMI 28.79 kg/m     Wt Readings from Last 3 Encounters:  09/01/23 218 lb 3.2 oz (99 kg)  08/13/23 214 lb (97.1 kg)  08/07/23 211 lb (95.7 kg)     GEN:  Well nourished, slurred speech, soft-spoken HEENT: Normal NECK: No JVD; No carotid bruits CARDIAC: Irregularly irregular RESPIRATORY: Diminished breath sounds at bases bilaterally ABDOMEN: Soft, non-tender, distended MUSCULOSKELETAL:  no edema; No deformity  SKIN: Warm and dry NEUROLOGIC:  Alert and oriented x 3 PSYCHIATRIC:  Normal affect   ASSESSMENT:    1.  Permanent atrial fibrillation (HCC)   2. Primary hypertension   3. Ventricular tachycardia (HCC)   4. Pacemaker    PLAN:    In order of problems listed above:  Permanent A-fib, continue verapamil  120 mg daily, edoxaban  30 mg  daily.  Edoxaban  dose appropriate for CKD. Ventricular tachycardia, continue p.o. amiodarone  200 mg daily. Hypertension, BP controlled.  Continue Lopressor  37.5 mg twice daily S/p leadless pacemaker.  Establish care with device clinic.  Follow-up in 1 year         Medication Adjustments/Labs and Tests Ordered: Current medicines are reviewed at length with the patient today.  Concerns regarding medicines are outlined above.  Orders Placed This Encounter  Procedures   EKG 12-Lead   No orders of the defined types were placed in this encounter.   Patient Instructions  Medication Instructions:  Your physician recommends that you continue on your current medications as directed. Please refer to the Current Medication list given to you today.  *If you need a refill on your cardiac medications before your next appointment, please call your pharmacy*  Lab Work: No labs ordered today  If you have labs (blood work) drawn today and your tests are completely normal, you will receive your results only by: MyChart Message (if you have MyChart) OR A paper copy in the mail If you have any lab test that is abnormal or we need to change your treatment, we will call you to review the results.  Testing/Procedures: No test ordered today   Follow-Up: At [facility], you and your health needs are our priority.  As part of our continuing mission to provide you with exceptional heart care, our providers are all part of one team.  This team includes your primary Cardiologist (physician) and Advanced Practice Providers or APPs (Physician Assistants and Nurse Practitioners) who all work together to provide you with the care you need, when you need it.  Your next  appointment:   1 year(s)  Provider:   You may see Redell Cave, MD or one of the following Advanced Practice Providers on your designated Care Team:   Lonni Meager, NP Lesley Maffucci, PA-C Bernardino Bring, PA-C Cadence Franchester, PA-C Tylene Lunch, NP Barnie Hila, NP          Signed, Redell Cave, MD  09/01/2023 12:29 PM    [facility]

## 2023-09-05 ENCOUNTER — Telehealth: Payer: Self-pay | Admitting: Nurse Practitioner

## 2023-09-05 NOTE — Telephone Encounter (Signed)
 08/22/23 OT /ST orders signed. Faxed back to [facility]; [phone]. Scanned-[name]

## 2023-09-08 ENCOUNTER — Other Ambulatory Visit: Payer: Self-pay | Admitting: Oncology

## 2023-09-08 ENCOUNTER — Other Ambulatory Visit: Payer: Self-pay

## 2023-09-08 ENCOUNTER — Encounter: Payer: Self-pay | Admitting: Nurse Practitioner

## 2023-09-08 DIAGNOSIS — Z950 Presence of cardiac pacemaker: Secondary | ICD-10-CM | POA: Diagnosis not present

## 2023-09-08 DIAGNOSIS — I69391 Dysphagia following cerebral infarction: Secondary | ICD-10-CM | POA: Diagnosis not present

## 2023-09-08 DIAGNOSIS — E1122 Type 2 diabetes mellitus with diabetic chronic kidney disease: Secondary | ICD-10-CM | POA: Diagnosis not present

## 2023-09-08 DIAGNOSIS — K5909 Other constipation: Secondary | ICD-10-CM | POA: Diagnosis not present

## 2023-09-08 DIAGNOSIS — I472 Ventricular tachycardia, unspecified: Secondary | ICD-10-CM | POA: Diagnosis not present

## 2023-09-08 DIAGNOSIS — I130 Hypertensive heart and chronic kidney disease with heart failure and stage 1 through stage 4 chronic kidney disease, or unspecified chronic kidney disease: Secondary | ICD-10-CM | POA: Diagnosis not present

## 2023-09-08 DIAGNOSIS — I493 Ventricular premature depolarization: Secondary | ICD-10-CM | POA: Diagnosis not present

## 2023-09-08 DIAGNOSIS — Z7984 Long term (current) use of oral hypoglycemic drugs: Secondary | ICD-10-CM | POA: Diagnosis not present

## 2023-09-08 DIAGNOSIS — G4733 Obstructive sleep apnea (adult) (pediatric): Secondary | ICD-10-CM | POA: Diagnosis not present

## 2023-09-08 DIAGNOSIS — D509 Iron deficiency anemia, unspecified: Secondary | ICD-10-CM | POA: Diagnosis not present

## 2023-09-08 DIAGNOSIS — H9190 Unspecified hearing loss, unspecified ear: Secondary | ICD-10-CM | POA: Diagnosis not present

## 2023-09-08 DIAGNOSIS — I69320 Aphasia following cerebral infarction: Secondary | ICD-10-CM | POA: Diagnosis not present

## 2023-09-08 DIAGNOSIS — R1312 Dysphagia, oropharyngeal phase: Secondary | ICD-10-CM | POA: Diagnosis not present

## 2023-09-08 DIAGNOSIS — C61 Malignant neoplasm of prostate: Secondary | ICD-10-CM

## 2023-09-08 DIAGNOSIS — I214 Non-ST elevation (NSTEMI) myocardial infarction: Secondary | ICD-10-CM | POA: Diagnosis not present

## 2023-09-08 DIAGNOSIS — N3001 Acute cystitis with hematuria: Secondary | ICD-10-CM | POA: Diagnosis not present

## 2023-09-08 DIAGNOSIS — I69392 Facial weakness following cerebral infarction: Secondary | ICD-10-CM | POA: Diagnosis not present

## 2023-09-08 DIAGNOSIS — E1165 Type 2 diabetes mellitus with hyperglycemia: Secondary | ICD-10-CM | POA: Diagnosis not present

## 2023-09-08 DIAGNOSIS — I5033 Acute on chronic diastolic (congestive) heart failure: Secondary | ICD-10-CM | POA: Diagnosis not present

## 2023-09-08 DIAGNOSIS — I4821 Permanent atrial fibrillation: Secondary | ICD-10-CM | POA: Diagnosis not present

## 2023-09-09 ENCOUNTER — Other Ambulatory Visit: Payer: Self-pay

## 2023-09-09 DIAGNOSIS — I69391 Dysphagia following cerebral infarction: Secondary | ICD-10-CM | POA: Diagnosis not present

## 2023-09-09 MED ORDER — ENZALUTAMIDE 40 MG PO TABS
160.0000 mg | ORAL_TABLET | Freq: Every day | ORAL | 2 refills | Status: DC
  Filled 2023-09-09 – 2023-09-10 (×2): qty 120, 30d supply, fill #0
  Filled 2023-10-06 – 2023-10-16 (×2): qty 120, 30d supply, fill #1
  Filled 2023-11-13 – 2023-11-17 (×2): qty 120, 30d supply, fill #2

## 2023-09-10 ENCOUNTER — Other Ambulatory Visit: Payer: Self-pay

## 2023-09-10 ENCOUNTER — Other Ambulatory Visit (HOSPITAL_COMMUNITY): Payer: Self-pay

## 2023-09-10 NOTE — Progress Notes (Signed)
 Specialty Pharmacy Refill Coordination Note  Spoke with Faucette,Tiffany (Daughter-In-Law)  [name] is a 89 y.o. male contacted today regarding refills of specialty medication(s) Enzalutamide  (XTANDI )  Doses on hand: 20 tablets/5 days  Patient requested: Delivery   Delivery date: 09/12/23   Verified address: [address] [place] [address]  Medication will be filled on 09/11/23.

## 2023-09-12 DIAGNOSIS — N184 Chronic kidney disease, stage 4 (severe): Secondary | ICD-10-CM | POA: Diagnosis not present

## 2023-09-12 DIAGNOSIS — I69351 Hemiplegia and hemiparesis following cerebral infarction affecting right dominant side: Secondary | ICD-10-CM | POA: Diagnosis not present

## 2023-09-12 DIAGNOSIS — Z87891 Personal history of nicotine dependence: Secondary | ICD-10-CM | POA: Diagnosis not present

## 2023-09-12 DIAGNOSIS — I5033 Acute on chronic diastolic (congestive) heart failure: Secondary | ICD-10-CM | POA: Diagnosis not present

## 2023-09-12 DIAGNOSIS — I4821 Permanent atrial fibrillation: Secondary | ICD-10-CM | POA: Diagnosis not present

## 2023-09-12 DIAGNOSIS — Z7984 Long term (current) use of oral hypoglycemic drugs: Secondary | ICD-10-CM | POA: Diagnosis not present

## 2023-09-12 DIAGNOSIS — I69391 Dysphagia following cerebral infarction: Secondary | ICD-10-CM | POA: Diagnosis not present

## 2023-09-12 DIAGNOSIS — E1165 Type 2 diabetes mellitus with hyperglycemia: Secondary | ICD-10-CM | POA: Diagnosis not present

## 2023-09-12 DIAGNOSIS — I214 Non-ST elevation (NSTEMI) myocardial infarction: Secondary | ICD-10-CM | POA: Diagnosis not present

## 2023-09-12 DIAGNOSIS — E1122 Type 2 diabetes mellitus with diabetic chronic kidney disease: Secondary | ICD-10-CM | POA: Diagnosis not present

## 2023-09-12 DIAGNOSIS — I472 Ventricular tachycardia, unspecified: Secondary | ICD-10-CM | POA: Diagnosis not present

## 2023-09-12 DIAGNOSIS — R1312 Dysphagia, oropharyngeal phase: Secondary | ICD-10-CM | POA: Diagnosis not present

## 2023-09-12 DIAGNOSIS — G4733 Obstructive sleep apnea (adult) (pediatric): Secondary | ICD-10-CM | POA: Diagnosis not present

## 2023-09-12 DIAGNOSIS — I130 Hypertensive heart and chronic kidney disease with heart failure and stage 1 through stage 4 chronic kidney disease, or unspecified chronic kidney disease: Secondary | ICD-10-CM | POA: Diagnosis not present

## 2023-09-12 DIAGNOSIS — E785 Hyperlipidemia, unspecified: Secondary | ICD-10-CM | POA: Diagnosis not present

## 2023-09-12 DIAGNOSIS — I69320 Aphasia following cerebral infarction: Secondary | ICD-10-CM | POA: Diagnosis not present

## 2023-09-12 DIAGNOSIS — H9190 Unspecified hearing loss, unspecified ear: Secondary | ICD-10-CM | POA: Diagnosis not present

## 2023-09-12 DIAGNOSIS — Z950 Presence of cardiac pacemaker: Secondary | ICD-10-CM | POA: Diagnosis not present

## 2023-09-12 DIAGNOSIS — K5909 Other constipation: Secondary | ICD-10-CM | POA: Diagnosis not present

## 2023-09-12 DIAGNOSIS — I69392 Facial weakness following cerebral infarction: Secondary | ICD-10-CM | POA: Diagnosis not present

## 2023-09-12 DIAGNOSIS — D509 Iron deficiency anemia, unspecified: Secondary | ICD-10-CM | POA: Diagnosis not present

## 2023-09-12 DIAGNOSIS — N3001 Acute cystitis with hematuria: Secondary | ICD-10-CM | POA: Diagnosis not present

## 2023-09-12 DIAGNOSIS — I493 Ventricular premature depolarization: Secondary | ICD-10-CM | POA: Diagnosis not present

## 2023-09-12 DIAGNOSIS — J449 Chronic obstructive pulmonary disease, unspecified: Secondary | ICD-10-CM | POA: Diagnosis not present

## 2023-09-15 DIAGNOSIS — G4733 Obstructive sleep apnea (adult) (pediatric): Secondary | ICD-10-CM | POA: Diagnosis not present

## 2023-09-15 DIAGNOSIS — I493 Ventricular premature depolarization: Secondary | ICD-10-CM | POA: Diagnosis not present

## 2023-09-15 DIAGNOSIS — Z950 Presence of cardiac pacemaker: Secondary | ICD-10-CM | POA: Diagnosis not present

## 2023-09-15 DIAGNOSIS — Z7984 Long term (current) use of oral hypoglycemic drugs: Secondary | ICD-10-CM | POA: Diagnosis not present

## 2023-09-15 DIAGNOSIS — N184 Chronic kidney disease, stage 4 (severe): Secondary | ICD-10-CM | POA: Diagnosis not present

## 2023-09-15 DIAGNOSIS — I69351 Hemiplegia and hemiparesis following cerebral infarction affecting right dominant side: Secondary | ICD-10-CM | POA: Diagnosis not present

## 2023-09-15 DIAGNOSIS — I69391 Dysphagia following cerebral infarction: Secondary | ICD-10-CM | POA: Diagnosis not present

## 2023-09-15 DIAGNOSIS — E1122 Type 2 diabetes mellitus with diabetic chronic kidney disease: Secondary | ICD-10-CM | POA: Diagnosis not present

## 2023-09-15 DIAGNOSIS — I69320 Aphasia following cerebral infarction: Secondary | ICD-10-CM | POA: Diagnosis not present

## 2023-09-15 DIAGNOSIS — Z87891 Personal history of nicotine dependence: Secondary | ICD-10-CM | POA: Diagnosis not present

## 2023-09-15 DIAGNOSIS — D509 Iron deficiency anemia, unspecified: Secondary | ICD-10-CM | POA: Diagnosis not present

## 2023-09-15 DIAGNOSIS — I472 Ventricular tachycardia, unspecified: Secondary | ICD-10-CM | POA: Diagnosis not present

## 2023-09-15 DIAGNOSIS — N3001 Acute cystitis with hematuria: Secondary | ICD-10-CM | POA: Diagnosis not present

## 2023-09-17 DIAGNOSIS — I69392 Facial weakness following cerebral infarction: Secondary | ICD-10-CM | POA: Diagnosis not present

## 2023-09-17 DIAGNOSIS — R1312 Dysphagia, oropharyngeal phase: Secondary | ICD-10-CM | POA: Diagnosis not present

## 2023-09-17 DIAGNOSIS — Z950 Presence of cardiac pacemaker: Secondary | ICD-10-CM | POA: Diagnosis not present

## 2023-09-17 DIAGNOSIS — I69351 Hemiplegia and hemiparesis following cerebral infarction affecting right dominant side: Secondary | ICD-10-CM | POA: Diagnosis not present

## 2023-09-17 DIAGNOSIS — G4733 Obstructive sleep apnea (adult) (pediatric): Secondary | ICD-10-CM | POA: Diagnosis not present

## 2023-09-17 DIAGNOSIS — I472 Ventricular tachycardia, unspecified: Secondary | ICD-10-CM | POA: Diagnosis not present

## 2023-09-17 DIAGNOSIS — I493 Ventricular premature depolarization: Secondary | ICD-10-CM | POA: Diagnosis not present

## 2023-09-17 DIAGNOSIS — Z87891 Personal history of nicotine dependence: Secondary | ICD-10-CM | POA: Diagnosis not present

## 2023-09-17 DIAGNOSIS — E1165 Type 2 diabetes mellitus with hyperglycemia: Secondary | ICD-10-CM | POA: Diagnosis not present

## 2023-09-17 DIAGNOSIS — K5909 Other constipation: Secondary | ICD-10-CM | POA: Diagnosis not present

## 2023-09-17 DIAGNOSIS — I69320 Aphasia following cerebral infarction: Secondary | ICD-10-CM | POA: Diagnosis not present

## 2023-09-17 DIAGNOSIS — I4821 Permanent atrial fibrillation: Secondary | ICD-10-CM | POA: Diagnosis not present

## 2023-09-17 DIAGNOSIS — D509 Iron deficiency anemia, unspecified: Secondary | ICD-10-CM | POA: Diagnosis not present

## 2023-09-17 DIAGNOSIS — I69391 Dysphagia following cerebral infarction: Secondary | ICD-10-CM | POA: Diagnosis not present

## 2023-09-17 DIAGNOSIS — J449 Chronic obstructive pulmonary disease, unspecified: Secondary | ICD-10-CM | POA: Diagnosis not present

## 2023-09-17 DIAGNOSIS — I5033 Acute on chronic diastolic (congestive) heart failure: Secondary | ICD-10-CM | POA: Diagnosis not present

## 2023-09-17 DIAGNOSIS — E1122 Type 2 diabetes mellitus with diabetic chronic kidney disease: Secondary | ICD-10-CM | POA: Diagnosis not present

## 2023-09-17 DIAGNOSIS — I130 Hypertensive heart and chronic kidney disease with heart failure and stage 1 through stage 4 chronic kidney disease, or unspecified chronic kidney disease: Secondary | ICD-10-CM | POA: Diagnosis not present

## 2023-09-17 DIAGNOSIS — H9190 Unspecified hearing loss, unspecified ear: Secondary | ICD-10-CM | POA: Diagnosis not present

## 2023-09-17 DIAGNOSIS — N184 Chronic kidney disease, stage 4 (severe): Secondary | ICD-10-CM | POA: Diagnosis not present

## 2023-09-17 DIAGNOSIS — N3001 Acute cystitis with hematuria: Secondary | ICD-10-CM | POA: Diagnosis not present

## 2023-09-17 DIAGNOSIS — E785 Hyperlipidemia, unspecified: Secondary | ICD-10-CM | POA: Diagnosis not present

## 2023-09-17 DIAGNOSIS — I214 Non-ST elevation (NSTEMI) myocardial infarction: Secondary | ICD-10-CM | POA: Diagnosis not present

## 2023-09-17 DIAGNOSIS — Z7984 Long term (current) use of oral hypoglycemic drugs: Secondary | ICD-10-CM | POA: Diagnosis not present

## 2023-09-18 ENCOUNTER — Emergency Department

## 2023-09-18 ENCOUNTER — Inpatient Hospital Stay
Admission: EM | Admit: 2023-09-18 | Discharge: 2023-10-01 | DRG: 071 | Disposition: A | Discharge status: Skilled Nursing Facility | Attending: Internal Medicine | Admitting: Internal Medicine

## 2023-09-18 ENCOUNTER — Other Ambulatory Visit: Payer: Self-pay

## 2023-09-18 DIAGNOSIS — Z950 Presence of cardiac pacemaker: Secondary | ICD-10-CM

## 2023-09-18 DIAGNOSIS — R9431 Abnormal electrocardiogram [ECG] [EKG]: Secondary | ICD-10-CM | POA: Insufficient documentation

## 2023-09-18 DIAGNOSIS — J449 Chronic obstructive pulmonary disease, unspecified: Secondary | ICD-10-CM | POA: Diagnosis present

## 2023-09-18 DIAGNOSIS — I5033 Acute on chronic diastolic (congestive) heart failure: Secondary | ICD-10-CM | POA: Diagnosis not present

## 2023-09-18 DIAGNOSIS — I495 Sick sinus syndrome: Secondary | ICD-10-CM | POA: Diagnosis present

## 2023-09-18 DIAGNOSIS — I700 Atherosclerosis of aorta: Secondary | ICD-10-CM | POA: Diagnosis not present

## 2023-09-18 DIAGNOSIS — Z1152 Encounter for screening for COVID-19: Secondary | ICD-10-CM

## 2023-09-18 DIAGNOSIS — E785 Hyperlipidemia, unspecified: Secondary | ICD-10-CM | POA: Diagnosis not present

## 2023-09-18 DIAGNOSIS — N184 Chronic kidney disease, stage 4 (severe): Secondary | ICD-10-CM

## 2023-09-18 DIAGNOSIS — I5042 Chronic combined systolic (congestive) and diastolic (congestive) heart failure: Secondary | ICD-10-CM | POA: Diagnosis present

## 2023-09-18 DIAGNOSIS — R410 Disorientation, unspecified: Secondary | ICD-10-CM | POA: Diagnosis not present

## 2023-09-18 DIAGNOSIS — Z87891 Personal history of nicotine dependence: Secondary | ICD-10-CM | POA: Diagnosis not present

## 2023-09-18 DIAGNOSIS — I5030 Unspecified diastolic (congestive) heart failure: Secondary | ICD-10-CM | POA: Diagnosis present

## 2023-09-18 DIAGNOSIS — N2581 Secondary hyperparathyroidism of renal origin: Secondary | ICD-10-CM | POA: Diagnosis not present

## 2023-09-18 DIAGNOSIS — R1310 Dysphagia, unspecified: Secondary | ICD-10-CM | POA: Diagnosis present

## 2023-09-18 DIAGNOSIS — K5909 Other constipation: Secondary | ICD-10-CM | POA: Diagnosis not present

## 2023-09-18 DIAGNOSIS — R7989 Other specified abnormal findings of blood chemistry: Secondary | ICD-10-CM | POA: Diagnosis not present

## 2023-09-18 DIAGNOSIS — I472 Ventricular tachycardia, unspecified: Secondary | ICD-10-CM | POA: Diagnosis not present

## 2023-09-18 DIAGNOSIS — I2489 Other forms of acute ischemic heart disease: Secondary | ICD-10-CM | POA: Diagnosis not present

## 2023-09-18 DIAGNOSIS — I214 Non-ST elevation (NSTEMI) myocardial infarction: Secondary | ICD-10-CM | POA: Diagnosis not present

## 2023-09-18 DIAGNOSIS — I130 Hypertensive heart and chronic kidney disease with heart failure and stage 1 through stage 4 chronic kidney disease, or unspecified chronic kidney disease: Secondary | ICD-10-CM | POA: Diagnosis present

## 2023-09-18 DIAGNOSIS — Z9842 Cataract extraction status, left eye: Secondary | ICD-10-CM

## 2023-09-18 DIAGNOSIS — Z86718 Personal history of other venous thrombosis and embolism: Secondary | ICD-10-CM

## 2023-09-18 DIAGNOSIS — Z7984 Long term (current) use of oral hypoglycemic drugs: Secondary | ICD-10-CM | POA: Diagnosis not present

## 2023-09-18 DIAGNOSIS — I2510 Atherosclerotic heart disease of native coronary artery without angina pectoris: Secondary | ICD-10-CM | POA: Diagnosis present

## 2023-09-18 DIAGNOSIS — I252 Old myocardial infarction: Secondary | ICD-10-CM

## 2023-09-18 DIAGNOSIS — D631 Anemia in chronic kidney disease: Secondary | ICD-10-CM | POA: Diagnosis present

## 2023-09-18 DIAGNOSIS — G4733 Obstructive sleep apnea (adult) (pediatric): Secondary | ICD-10-CM | POA: Diagnosis not present

## 2023-09-18 DIAGNOSIS — Z91148 Patient's other noncompliance with medication regimen for other reason: Secondary | ICD-10-CM

## 2023-09-18 DIAGNOSIS — Z7401 Bed confinement status: Secondary | ICD-10-CM | POA: Diagnosis not present

## 2023-09-18 DIAGNOSIS — D509 Iron deficiency anemia, unspecified: Secondary | ICD-10-CM | POA: Diagnosis not present

## 2023-09-18 DIAGNOSIS — I5043 Acute on chronic combined systolic (congestive) and diastolic (congestive) heart failure: Secondary | ICD-10-CM | POA: Diagnosis not present

## 2023-09-18 DIAGNOSIS — I129 Hypertensive chronic kidney disease with stage 1 through stage 4 chronic kidney disease, or unspecified chronic kidney disease: Secondary | ICD-10-CM | POA: Diagnosis not present

## 2023-09-18 DIAGNOSIS — Z87442 Personal history of urinary calculi: Secondary | ICD-10-CM

## 2023-09-18 DIAGNOSIS — E8809 Other disorders of plasma-protein metabolism, not elsewhere classified: Secondary | ICD-10-CM | POA: Diagnosis present

## 2023-09-18 DIAGNOSIS — H9190 Unspecified hearing loss, unspecified ear: Secondary | ICD-10-CM | POA: Diagnosis present

## 2023-09-18 DIAGNOSIS — E1129 Type 2 diabetes mellitus with other diabetic kidney complication: Secondary | ICD-10-CM | POA: Diagnosis not present

## 2023-09-18 DIAGNOSIS — K5730 Diverticulosis of large intestine without perforation or abscess without bleeding: Secondary | ICD-10-CM | POA: Diagnosis not present

## 2023-09-18 DIAGNOSIS — I493 Ventricular premature depolarization: Secondary | ICD-10-CM | POA: Diagnosis not present

## 2023-09-18 DIAGNOSIS — A0811 Acute gastroenteropathy due to Norwalk agent: Secondary | ICD-10-CM | POA: Diagnosis present

## 2023-09-18 DIAGNOSIS — E1151 Type 2 diabetes mellitus with diabetic peripheral angiopathy without gangrene: Secondary | ICD-10-CM | POA: Diagnosis not present

## 2023-09-18 DIAGNOSIS — G9341 Metabolic encephalopathy: Secondary | ICD-10-CM | POA: Diagnosis not present

## 2023-09-18 DIAGNOSIS — Z8719 Personal history of other diseases of the digestive system: Secondary | ICD-10-CM

## 2023-09-18 DIAGNOSIS — Z8042 Family history of malignant neoplasm of prostate: Secondary | ICD-10-CM

## 2023-09-18 DIAGNOSIS — Z7985 Long-term (current) use of injectable non-insulin antidiabetic drugs: Secondary | ICD-10-CM

## 2023-09-18 DIAGNOSIS — R238 Other skin changes: Secondary | ICD-10-CM | POA: Diagnosis present

## 2023-09-18 DIAGNOSIS — K626 Ulcer of anus and rectum: Secondary | ICD-10-CM | POA: Diagnosis not present

## 2023-09-18 DIAGNOSIS — M1990 Unspecified osteoarthritis, unspecified site: Secondary | ICD-10-CM | POA: Diagnosis present

## 2023-09-18 DIAGNOSIS — R1312 Dysphagia, oropharyngeal phase: Secondary | ICD-10-CM | POA: Diagnosis not present

## 2023-09-18 DIAGNOSIS — R040 Epistaxis: Secondary | ICD-10-CM | POA: Diagnosis not present

## 2023-09-18 DIAGNOSIS — Z7901 Long term (current) use of anticoagulants: Secondary | ICD-10-CM

## 2023-09-18 DIAGNOSIS — I4729 Other ventricular tachycardia: Secondary | ICD-10-CM | POA: Diagnosis present

## 2023-09-18 DIAGNOSIS — Z9889 Other specified postprocedural states: Secondary | ICD-10-CM

## 2023-09-18 DIAGNOSIS — E1122 Type 2 diabetes mellitus with diabetic chronic kidney disease: Secondary | ICD-10-CM | POA: Diagnosis present

## 2023-09-18 DIAGNOSIS — K0889 Other specified disorders of teeth and supporting structures: Secondary | ICD-10-CM | POA: Diagnosis present

## 2023-09-18 DIAGNOSIS — I517 Cardiomegaly: Secondary | ICD-10-CM | POA: Diagnosis not present

## 2023-09-18 DIAGNOSIS — E1165 Type 2 diabetes mellitus with hyperglycemia: Secondary | ICD-10-CM | POA: Diagnosis not present

## 2023-09-18 DIAGNOSIS — R9389 Abnormal findings on diagnostic imaging of other specified body structures: Secondary | ICD-10-CM | POA: Diagnosis not present

## 2023-09-18 DIAGNOSIS — Z794 Long term (current) use of insulin: Secondary | ICD-10-CM

## 2023-09-18 DIAGNOSIS — Z820 Family history of epilepsy and other diseases of the nervous system: Secondary | ICD-10-CM

## 2023-09-18 DIAGNOSIS — Z7982 Long term (current) use of aspirin: Secondary | ICD-10-CM

## 2023-09-18 DIAGNOSIS — Z5320 Procedure and treatment not carried out because of patient's decision for unspecified reasons: Secondary | ICD-10-CM | POA: Diagnosis present

## 2023-09-18 DIAGNOSIS — R404 Transient alteration of awareness: Secondary | ICD-10-CM | POA: Diagnosis not present

## 2023-09-18 DIAGNOSIS — I6782 Cerebral ischemia: Secondary | ICD-10-CM | POA: Diagnosis not present

## 2023-09-18 DIAGNOSIS — N200 Calculus of kidney: Secondary | ICD-10-CM | POA: Diagnosis not present

## 2023-09-18 DIAGNOSIS — G9349 Other encephalopathy: Secondary | ICD-10-CM | POA: Diagnosis present

## 2023-09-18 DIAGNOSIS — I69318 Other symptoms and signs involving cognitive functions following cerebral infarction: Secondary | ICD-10-CM | POA: Diagnosis not present

## 2023-09-18 DIAGNOSIS — N289 Disorder of kidney and ureter, unspecified: Secondary | ICD-10-CM

## 2023-09-18 DIAGNOSIS — Z8249 Family history of ischemic heart disease and other diseases of the circulatory system: Secondary | ICD-10-CM

## 2023-09-18 DIAGNOSIS — Z8619 Personal history of other infectious and parasitic diseases: Secondary | ICD-10-CM

## 2023-09-18 DIAGNOSIS — G9340 Encephalopathy, unspecified: Principal | ICD-10-CM

## 2023-09-18 DIAGNOSIS — E860 Dehydration: Secondary | ICD-10-CM | POA: Diagnosis not present

## 2023-09-18 DIAGNOSIS — C61 Malignant neoplasm of prostate: Secondary | ICD-10-CM | POA: Diagnosis present

## 2023-09-18 DIAGNOSIS — K921 Melena: Secondary | ICD-10-CM | POA: Diagnosis not present

## 2023-09-18 DIAGNOSIS — Z833 Family history of diabetes mellitus: Secondary | ICD-10-CM

## 2023-09-18 DIAGNOSIS — I7143 Infrarenal abdominal aortic aneurysm, without rupture: Secondary | ICD-10-CM | POA: Diagnosis not present

## 2023-09-18 DIAGNOSIS — Z743 Need for continuous supervision: Secondary | ICD-10-CM | POA: Diagnosis not present

## 2023-09-18 DIAGNOSIS — N1831 Chronic kidney disease, stage 3a: Secondary | ICD-10-CM | POA: Diagnosis not present

## 2023-09-18 DIAGNOSIS — I69391 Dysphagia following cerebral infarction: Secondary | ICD-10-CM | POA: Diagnosis not present

## 2023-09-18 DIAGNOSIS — N1832 Chronic kidney disease, stage 3b: Secondary | ICD-10-CM | POA: Diagnosis not present

## 2023-09-18 DIAGNOSIS — N3001 Acute cystitis with hematuria: Secondary | ICD-10-CM | POA: Diagnosis not present

## 2023-09-18 DIAGNOSIS — Z6827 Body mass index (BMI) 27.0-27.9, adult: Secondary | ICD-10-CM

## 2023-09-18 DIAGNOSIS — K625 Hemorrhage of anus and rectum: Secondary | ICD-10-CM | POA: Diagnosis not present

## 2023-09-18 DIAGNOSIS — K7689 Other specified diseases of liver: Secondary | ICD-10-CM | POA: Diagnosis not present

## 2023-09-18 DIAGNOSIS — E441 Mild protein-calorie malnutrition: Secondary | ICD-10-CM | POA: Diagnosis not present

## 2023-09-18 DIAGNOSIS — R5381 Other malaise: Secondary | ICD-10-CM | POA: Diagnosis not present

## 2023-09-18 DIAGNOSIS — Z7951 Long term (current) use of inhaled steroids: Secondary | ICD-10-CM

## 2023-09-18 DIAGNOSIS — I4821 Permanent atrial fibrillation: Secondary | ICD-10-CM | POA: Diagnosis not present

## 2023-09-18 DIAGNOSIS — K439 Ventral hernia without obstruction or gangrene: Secondary | ICD-10-CM | POA: Diagnosis present

## 2023-09-18 DIAGNOSIS — Z8673 Personal history of transient ischemic attack (TIA), and cerebral infarction without residual deficits: Secondary | ICD-10-CM

## 2023-09-18 DIAGNOSIS — Z7969 Long term (current) use of other immunomodulators and immunosuppressants: Secondary | ICD-10-CM

## 2023-09-18 DIAGNOSIS — Z800 Family history of malignant neoplasm of digestive organs: Secondary | ICD-10-CM

## 2023-09-18 DIAGNOSIS — N179 Acute kidney failure, unspecified: Secondary | ICD-10-CM | POA: Diagnosis present

## 2023-09-18 DIAGNOSIS — I4891 Unspecified atrial fibrillation: Secondary | ICD-10-CM | POA: Diagnosis not present

## 2023-09-18 DIAGNOSIS — I959 Hypotension, unspecified: Secondary | ICD-10-CM | POA: Diagnosis not present

## 2023-09-18 DIAGNOSIS — K5731 Diverticulosis of large intestine without perforation or abscess with bleeding: Secondary | ICD-10-CM | POA: Diagnosis present

## 2023-09-18 DIAGNOSIS — I69398 Other sequelae of cerebral infarction: Secondary | ICD-10-CM

## 2023-09-18 DIAGNOSIS — I10 Essential (primary) hypertension: Secondary | ICD-10-CM | POA: Diagnosis not present

## 2023-09-18 DIAGNOSIS — G9389 Other specified disorders of brain: Secondary | ICD-10-CM | POA: Diagnosis not present

## 2023-09-18 DIAGNOSIS — E46 Unspecified protein-calorie malnutrition: Secondary | ICD-10-CM | POA: Diagnosis not present

## 2023-09-18 DIAGNOSIS — R011 Cardiac murmur, unspecified: Secondary | ICD-10-CM | POA: Diagnosis present

## 2023-09-18 DIAGNOSIS — J439 Emphysema, unspecified: Secondary | ICD-10-CM | POA: Diagnosis not present

## 2023-09-18 DIAGNOSIS — Z9841 Cataract extraction status, right eye: Secondary | ICD-10-CM

## 2023-09-18 DIAGNOSIS — Z79899 Other long term (current) drug therapy: Secondary | ICD-10-CM

## 2023-09-18 DIAGNOSIS — I69392 Facial weakness following cerebral infarction: Secondary | ICD-10-CM | POA: Diagnosis not present

## 2023-09-18 DIAGNOSIS — I69320 Aphasia following cerebral infarction: Secondary | ICD-10-CM | POA: Diagnosis not present

## 2023-09-18 DIAGNOSIS — I69351 Hemiplegia and hemiparesis following cerebral infarction affecting right dominant side: Secondary | ICD-10-CM | POA: Diagnosis not present

## 2023-09-18 LAB — CBC WITH DIFFERENTIAL/PLATELET
Abs Immature Granulocytes: 0.03 K/uL (ref 0.00–0.07)
Basophils Absolute: 0 K/uL (ref 0.0–0.1)
Basophils Relative: 0 %
Eosinophils Absolute: 0 K/uL (ref 0.0–0.5)
Eosinophils Relative: 0 %
HCT: 47.6 % (ref 39.0–52.0)
Hemoglobin: 15.6 g/dL (ref 13.0–17.0)
Immature Granulocytes: 0 %
Lymphocytes Relative: 16 %
Lymphs Abs: 1.2 K/uL (ref 0.7–4.0)
MCH: 31 pg (ref 26.0–34.0)
MCHC: 32.8 g/dL (ref 30.0–36.0)
MCV: 94.6 fL (ref 80.0–100.0)
Monocytes Absolute: 0.8 K/uL (ref 0.1–1.0)
Monocytes Relative: 11 %
Neutro Abs: 5.4 K/uL (ref 1.7–7.7)
Neutrophils Relative %: 73 %
Platelets: 181 K/uL (ref 150–400)
RBC: 5.03 MIL/uL (ref 4.22–5.81)
RDW: 16.2 % — ABNORMAL HIGH (ref 11.5–15.5)
WBC: 7.5 K/uL (ref 4.0–10.5)
nRBC: 0 % (ref 0.0–0.2)

## 2023-09-18 LAB — URINALYSIS, COMPLETE (UACMP) WITH MICROSCOPIC
Bacteria, UA: NONE SEEN
Bilirubin Urine: NEGATIVE
Glucose, UA: >=500 mg/dL — AB
Ketones, ur: NEGATIVE mg/dL
Leukocytes,Ua: NEGATIVE
Nitrite: NEGATIVE
Protein, ur: NEGATIVE mg/dL
Specific Gravity, Urine: 1.013 (ref 1.005–1.030)
Squamous Epithelial / HPF: 0 /HPF (ref 0–5)
pH: 5 (ref 5.0–8.0)

## 2023-09-18 LAB — COMPREHENSIVE METABOLIC PANEL WITH GFR
ALT: 12 U/L (ref 0–44)
AST: 19 U/L (ref 15–41)
Albumin: 3.3 g/dL — ABNORMAL LOW (ref 3.5–5.0)
Alkaline Phosphatase: 106 U/L (ref 38–126)
Anion gap: 12 (ref 5–15)
BUN: 47 mg/dL — ABNORMAL HIGH (ref 8–23)
CO2: 23 mmol/L (ref 22–32)
Calcium: 9.4 mg/dL (ref 8.9–10.3)
Chloride: 103 mmol/L (ref 98–111)
Creatinine, Ser: 2.58 mg/dL — ABNORMAL HIGH (ref 0.61–1.24)
GFR, Estimated: 23 mL/min — ABNORMAL LOW (ref >60)
Glucose, Bld: 134 mg/dL — ABNORMAL HIGH (ref 70–99)
Potassium: 4.2 mmol/L (ref 3.5–5.1)
Sodium: 138 mmol/L (ref 135–145)
Total Bilirubin: 0.8 mg/dL (ref 0.0–1.2)
Total Protein: 7.5 g/dL (ref 6.5–8.1)

## 2023-09-18 LAB — HEMOGLOBIN AND HEMATOCRIT, BLOOD
HCT: 45.1 % (ref 39.0–52.0)
Hemoglobin: 14.9 g/dL (ref 13.0–17.0)

## 2023-09-18 LAB — GLUCOSE, CAPILLARY: Glucose-Capillary: 110 mg/dL — ABNORMAL HIGH (ref 70–99)

## 2023-09-18 LAB — TROPONIN I (HIGH SENSITIVITY)
Troponin I (High Sensitivity): 25 ng/L — ABNORMAL HIGH (ref <18)
Troponin I (High Sensitivity): 23 ng/L — ABNORMAL HIGH (ref <18)

## 2023-09-18 LAB — T4, FREE: Free T4: 0.98 ng/dL (ref 0.61–1.12)

## 2023-09-18 LAB — TSH: TSH: 4.33 u[IU]/mL (ref 0.350–4.500)

## 2023-09-18 LAB — PROCALCITONIN: Procalcitonin: 0.11 ng/mL

## 2023-09-18 LAB — LACTIC ACID, PLASMA: Lactic Acid, Venous: 1.3 mmol/L (ref 0.5–1.9)

## 2023-09-18 LAB — BLOOD GAS, VENOUS

## 2023-09-18 LAB — BRAIN NATRIURETIC PEPTIDE: B Natriuretic Peptide: 195.6 pg/mL — ABNORMAL HIGH (ref 0.0–100.0)

## 2023-09-18 MED ORDER — PROCHLORPERAZINE EDISYLATE 10 MG/2ML IJ SOLN: 10.0000 mg | Freq: Four times a day (QID) | INTRAMUSCULAR | Status: DC | PRN

## 2023-09-18 MED ORDER — GLUCERNA SHAKE PO LIQD
237.0000 mL | Freq: Three times a day (TID) | ORAL | Status: DC
  Administered 2023-09-18 – 2023-09-30 (×29): 237 mL via ORAL

## 2023-09-18 MED ORDER — EDOXABAN TOSYLATE 30 MG PO TABS
30.0000 mg | ORAL_TABLET | Freq: Every day | ORAL | Status: DC
  Administered 2023-09-19: 30 mg via ORAL
  Filled 2023-09-18: qty 1

## 2023-09-18 MED ORDER — LACTATED RINGERS IV SOLN: INTRAVENOUS | Status: AC

## 2023-09-18 MED ORDER — ACETAMINOPHEN 650 MG RE SUPP: 650.0000 mg | Freq: Four times a day (QID) | RECTAL | Status: DC | PRN

## 2023-09-18 MED ORDER — VERAPAMIL HCL ER 120 MG PO TBCR
120.0000 mg | EXTENDED_RELEASE_TABLET | Freq: Every day | ORAL | Status: DC
  Administered 2023-09-19 – 2023-09-30 (×13): 120 mg via ORAL
  Filled 2023-09-18 (×14): qty 1

## 2023-09-18 MED ORDER — ENZALUTAMIDE 40 MG PO TABS: 160.0000 mg | ORAL_TABLET | Freq: Every day | ORAL | Status: DC

## 2023-09-18 MED ORDER — TIOTROPIUM BROMIDE MONOHYDRATE 2.5 MCG/ACT IN AERS: 2.0000 | INHALATION_SPRAY | Freq: Every day | RESPIRATORY_TRACT | Status: DC

## 2023-09-18 MED ORDER — UMECLIDINIUM BROMIDE 62.5 MCG/ACT IN AEPB
1.0000 | INHALATION_SPRAY | Freq: Every day | RESPIRATORY_TRACT | Status: DC
  Administered 2023-09-19 – 2023-10-01 (×13): 1 via RESPIRATORY_TRACT
  Filled 2023-09-18 (×2): qty 7

## 2023-09-18 MED ORDER — ACETAMINOPHEN 325 MG PO TABS
650.0000 mg | ORAL_TABLET | Freq: Four times a day (QID) | ORAL | Status: DC | PRN
  Administered 2023-09-24 (×2): 650 mg via ORAL
  Filled 2023-09-18 (×2): qty 2

## 2023-09-18 MED ORDER — ATORVASTATIN CALCIUM 10 MG PO TABS
10.0000 mg | ORAL_TABLET | Freq: Every day | ORAL | Status: DC
  Administered 2023-09-19 – 2023-10-01 (×13): 10 mg via ORAL
  Filled 2023-09-18 (×13): qty 1

## 2023-09-18 MED ORDER — PANTOPRAZOLE SODIUM 40 MG PO TBEC
40.0000 mg | DELAYED_RELEASE_TABLET | Freq: Every day | ORAL | Status: DC
  Administered 2023-09-19 – 2023-10-01 (×13): 40 mg via ORAL
  Filled 2023-09-18 (×13): qty 1

## 2023-09-18 MED ORDER — INSULIN ASPART 100 UNIT/ML IJ SOLN
0.0000 [IU] | Freq: Three times a day (TID) | INTRAMUSCULAR | Status: DC
  Administered 2023-09-19: 2 [IU] via SUBCUTANEOUS
  Administered 2023-09-19 – 2023-09-20 (×2): 1 [IU] via SUBCUTANEOUS
  Administered 2023-09-21: 2 [IU] via SUBCUTANEOUS
  Administered 2023-09-21 – 2023-09-23 (×4): 1 [IU] via SUBCUTANEOUS
  Administered 2023-09-23: 2 [IU] via SUBCUTANEOUS
  Administered 2023-09-24: 3 [IU] via SUBCUTANEOUS
  Administered 2023-09-25: 1 [IU] via SUBCUTANEOUS
  Administered 2023-09-25: 3 [IU] via SUBCUTANEOUS
  Administered 2023-09-25 – 2023-09-26 (×4): 2 [IU] via SUBCUTANEOUS
  Administered 2023-09-27: 1 [IU] via SUBCUTANEOUS
  Administered 2023-09-27: 2 [IU] via SUBCUTANEOUS
  Administered 2023-09-28: 1 [IU] via SUBCUTANEOUS
  Administered 2023-09-28 – 2023-09-29 (×2): 2 [IU] via SUBCUTANEOUS
  Administered 2023-09-29: 1 [IU] via SUBCUTANEOUS
  Filled 2023-09-18 (×23): qty 1

## 2023-09-18 NOTE — ED Notes (Signed)
 Floor contacted of pt's arrival to unit.

## 2023-09-18 NOTE — H&P (Incomplete)
 History and Physical    Patient: [name] FMW:[id number] DOB: 02/20/1933 DOA: 09/18/2023 DOS: the patient was seen and examined on 09/18/2023 PCP: Liana Fish, NP  Patient coming from: Home  Chief Complaint:  Chief Complaint  Patient presents with   Abdominal Pain   Altered Mental Status   HPI: [name] is a 89 y.o. male with medical history significant of hypertension, hyperlipidemia, T2DM, permanent atrial fibrillation on edoxaban , SSS s/p PPM, HFrEF with EF of 45 to 50% -06/24/2023), V-Tach on amiodarone , stage IVb metastatic prostate cancer on Xtandi  and leuprolide  who presents to the emergency department from home via EMS due to 2-week onset of progressive worsening confusion.  Patient was unable to provide history, history was obtained from EDP, ED medical record and daughter-in-law at bedside.  Per report, patient lives at home with wife who has dementia and he was able to take care of his ADLs and care for his wife.  However, since past 2-3 weeks, family checked on him from time to time noted changes in cognition and several episodes of diarrhea.  It was also noted that he has not been taking his medications including amiodarone  as prescribed (he was taking more pills than he was supposed to), assumptions made based on number of pills in his pillbox.  His occupational therapist followed up with him today and noted he was covered in stool and was more confused, so he requested for him to be taken to the ED for further evaluation and management.  Patient was admitted from 6/2 to 6/6 due to monomorphic V. tach during which he underwent t LHC with Dr. Ammon (06/03) and revealed 40% stenosed Ost/prox LAD, mild LV systolic dysfunction.  He was also admitted from 6/7 to 6/20 due to left MCA infarct with left M1 occlusion s/p mechanical thrombectomy with TICI3 revascularization, etiology likely A-fib on DOAC in the setting of recent cardioversion.   ED Course:  In the  emergency department, temperature was 97.4 F, BP was 121/91, respiratory rate 20/min, pulse 84 bpm, O2 sat 99% on room air.  Workup in the ED showed normal CBC.  BMP was normal except for blood glucose of 134, BUN 47, creatinine 2.58 (baseline creatinine at 1.4-1.5), albumin  3.3.  BNP 195.6 (this was 495 on 06/23/2023).,  Free T4 - 0.98, TSH 4.33, procalcitonin 0.11, lactic acid 1.3, VBG was normal, troponin 25 (this was 1,158 on 06/24/2023). CT of head personally reviewed showed no intracranial abnormality CT abdomen and pelvis without contrast showed no acute intra-abdominal or pelvic abnormality. Chest x-ray showed no acute abnormality [facility] was asked to admit patient.  Review of Systems: Review of systems as noted in the HPI. All other systems reviewed and are negative.   Past Medical History:  Diagnosis Date   Acute deep vein thrombosis (DVT) of femoral vein of left lower extremity (HCC)    Atrial fibrillation (HCC)    CHF (congestive heart failure) (HCC)    Chronic kidney disease    Diabetes (HCC)    Hearing loss    Hyperlipidemia    Liver cyst    Nephrolithiasis    OSA (obstructive sleep apnea)    Prostate cancer (HCC)    SSS (sick sinus syndrome) (HCC)    Stroke (HCC)    Valvular heart disease    Past Surgical History:  Procedure Laterality Date   CATARACT EXTRACTION     COLONOSCOPY WITH PROPOFOL  N/A 07/02/2019   Procedure: COLONOSCOPY WITH PROPOFOL ;  Surgeon: Unk Corinn Skiff, MD;  Location: [facility] ENDOSCOPY;  Service: Gastroenterology;  Laterality: N/A;   ESOPHAGOGASTRODUODENOSCOPY (EGD) WITH PROPOFOL  N/A 03/19/2022   Procedure: ESOPHAGOGASTRODUODENOSCOPY (EGD) WITH PROPOFOL ;  Surgeon: Therisa Bi, MD;  Location: [facility] ENDOSCOPY;  Service: Gastroenterology;  Laterality: N/A;   IR CT HEAD LTD  08/27/2019   IR CT HEAD LTD  06/28/2023   IR PERCUTANEOUS ART THROMBECTOMY/INFUSION INTRACRANIAL INC DIAG ANGIO  08/27/2019       IR PERCUTANEOUS ART THROMBECTOMY/INFUSION INTRACRANIAL INC DIAG  ANGIO  08/27/2019   IR PERCUTANEOUS ART THROMBECTOMY/INFUSION INTRACRANIAL INC DIAG ANGIO  06/28/2023   LEFT HEART CATH AND CORONARY ANGIOGRAPHY N/A 06/24/2023   Procedure: LEFT HEART CATH AND CORONARY ANGIOGRAPHY;  Surgeon: Ammon Blunt, MD;  Location: [facility] INVASIVE CV LAB;  Service: Cardiovascular;  Laterality: N/A;   LOWER EXTREMITY ANGIOGRAPHY Left 12/25/2020   Procedure: Lower Extremity Angiography;  Surgeon: Marea Selinda RAMAN, MD;  Location: [facility] INVASIVE CV LAB;  Service: Cardiovascular;  Laterality: Left;   PACEMAKER LEADLESS INSERTION N/A 12/19/2020   Procedure: PACEMAKER LEADLESS INSERTION;  Surgeon: Ammon Blunt, MD;  Location: [facility] INVASIVE CV LAB;  Service: Cardiovascular;  Laterality: N/A;   PROSTATE CANCER     PROSTATE SURGERY     RADIOLOGY WITH ANESTHESIA N/A 08/27/2019   Procedure: IR WITH ANESTHESIA;  Surgeon: Dolphus Carrion, MD;  Location: [facility] OR;  Service: Radiology;  Laterality: N/A;   RADIOLOGY WITH ANESTHESIA N/A 06/28/2023   Procedure: RADIOLOGY WITH ANESTHESIA;  Surgeon: Radiologist, Medication, MD;  Location: [facility] OR;  Service: Radiology;  Laterality: N/A;    Social History:  reports that he has quit smoking. His smoking use included cigarettes. He has never used smokeless tobacco. He reports that he does not drink alcohol and does not use drugs.   No Known Allergies  Family History  Problem Relation Age of Onset   Colon cancer Mother    Diabetes Other    High blood pressure Other    Prostate cancer Brother    Diabetes Brother      Prior to Admission medications   Medication Sig Start Date End Date Taking? Authorizing Provider  albuterol  (VENTOLIN  HFA) 108 (90 Base) MCG/ACT inhaler Inhale 2 puffs into the lungs every 4 (four) hours as needed for wheezing or shortness of breath. 12/26/22   Liana Fish, NP  amiodarone  (PACERONE ) 200 MG tablet Take 1 tablet (200 mg total) by mouth daily. 08/07/23   Liana Fish, NP  atorvastatin  (LIPITOR) 10 MG  tablet Take 1 tablet (10 mg total) by mouth daily. 08/07/23   Abernathy, Alyssa, NP  Cholecalciferol (VITAMIN D-3 PO) Take 1 capsule by mouth daily.    [provider]  dapagliflozin  propanediol (FARXIGA ) 10 MG TABS tablet Take 1 tablet (10 mg total) by mouth daily. 08/07/23   Liana Fish, NP  edoxaban  (SAVAYSA ) 30 MG TABS tablet Take 30 mg by mouth daily.    [provider]  enzalutamide  (XTANDI ) 40 MG tablet Take 4 tablets (160 mg total) by mouth daily. 09/09/23   Babara Call, MD  ferrous sulfate  (FEROSUL) 325 (65 FE) MG tablet Take 1 tablet by mouth daily. 02/02/21   [provider]  furosemide  (LASIX ) 20 MG tablet Take 1 tablet (20 mg total) by mouth daily. 12/26/22   Liana Fish, NP  nitroGLYCERIN  (NITROSTAT ) 0.4 MG SL tablet Place 1 tablet (0.4 mg total) under the tongue every 5 (five) minutes as needed for chest pain. 06/27/23   Maree Hue, MD  omeprazole (PRILOSEC) 20 MG capsule Take 20 mg by mouth daily.  [provider]  polyethylene glycol powder (GLYCOLAX /MIRALAX ) 17 GM/SCOOP powder Take 17 g by mouth daily as needed for mild constipation. Mix as directed. 06/27/23   Maree Hue, MD  Semaglutide ,0.25 or 0.5MG /DOS, (OZEMPIC , 0.25 OR 0.5 MG/DOSE,) 2 MG/3ML SOPN Inject 0.5 mg into the skin once a week. 05/17/23   Liana Fish, NP  Tiotropium Bromide  Monohydrate (SPIRIVA  RESPIMAT) 2.5 MCG/ACT AERS Inhale 2 puffs into the lungs daily. 12/26/22   Liana Fish, NP  verapamil  (CALAN -SR) 120 MG CR tablet Take 1 tablet (120 mg total) by mouth at bedtime. 08/15/23   Riddle, Suzann, NP  vitamin B-12 (CYANOCOBALAMIN ) 1000 MCG tablet Take 1 tablet (1,000 mcg total) by mouth daily. 12/08/20   Liana Fish, NP    Physical Exam: BP 124/79 (BP Location: Right Arm)   Pulse (!) 51   Temp 97.9 F (36.6 C)   Resp 20   Ht 6' 1 (1.854 m)   Wt 94 kg   SpO2 94%   BMI 27.34 kg/m   General: 89 y.o. year-old male well developed well nourished in no acute  distress.  Alert and oriented to self only. HEENT: NCAT, EOMI, dry mucous membrane Neck: Supple, trachea medial Cardiovascular: Regular rate and rhythm with no rubs or gallops.  No thyromegaly or JVD noted.  No lower extremity edema. 2/4 pulses in all 4 extremities. Respiratory: Clear to auscultation with no wheezes or rales. Good inspiratory effort. Abdomen: Soft, nontender nondistended with normal bowel sounds x4 quadrants. Muskuloskeletal: No cyanosis, clubbing or edema noted bilaterally Neuro: CN II-XII intact, strength 5/5 x 4, sensation, reflexes intact Skin: No ulcerative lesions noted or rashes, decreased skin turgor Psychiatry: Mood is appropriate for condition and setting          Labs on Admission:  Basic Metabolic Panel: Recent Labs  Lab 09/18/23 1554  NA 138  K 4.2  CL 103  CO2 23  GLUCOSE 134*  BUN 47*  CREATININE 2.58*  CALCIUM  9.4   Liver Function Tests: Recent Labs  Lab 09/18/23 1554  AST 19  ALT 12  ALKPHOS 106  BILITOT 0.8  PROT 7.5  ALBUMIN  3.3*   No results for input(s): LIPASE, AMYLASE in the last 168 hours. No results for input(s): AMMONIA in the last 168 hours. CBC: Recent Labs  Lab 09/18/23 1554  WBC 7.5  NEUTROABS 5.4  HGB 15.6  HCT 47.6  MCV 94.6  PLT 181   Cardiac Enzymes: No results for input(s): CKTOTAL, CKMB, CKMBINDEX, TROPONINI in the last 168 hours.  BNP (last 3 results) Recent Labs    09/21/22 1239 06/23/23 1857 09/18/23 1554  BNP 307.8* 495.0* 195.6*    ProBNP (last 3 results) No results for input(s): PROBNP in the last 8760 hours.  CBG: No results for input(s): GLUCAP in the last 168 hours.  Radiological Exams on Admission: CT ABDOMEN PELVIS WO CONTRAST Result Date: 09/18/2023 CLINICAL DATA:  Worsening abdominal pain history of metastatic prostate cancer EXAM: CT ABDOMEN AND PELVIS WITHOUT CONTRAST TECHNIQUE: Multidetector CT imaging of the abdomen and pelvis was performed following the  standard protocol without IV contrast. RADIATION DOSE REDUCTION: This exam was performed according to the departmental dose-optimization program which includes automated exposure control, adjustment of the mA and/or kV according to patient size and/or use of iterative reconstruction technique. COMPARISON:  CT 07/06/2023, 07/03/2023, 02/15/2018, 08/19/2016 FINDINGS: Lower chest: Lung bases demonstrate no acute airspace disease. Cardiomegaly. Leadless pacemaker in the right ventricle. Hepatobiliary: Hepatic cysts. Isodense exophytic lesion off the right hepatic  lobe measures about 4.5 cm, likely represents complex cyst and is stable compared with multiple prior exams. No calcified gallstone or biliary dilatation Pancreas: Unremarkable. No pancreatic ductal dilatation or surrounding inflammatory changes. Spleen: Normal in size without focal abnormality. Adrenals/Urinary Tract: Adrenal glands are stable in appearance. Multifocal cortical scarring in the bilateral kidneys. Bilateral kidney stones. No hydronephrosis. The bladder appears slightly thick-walled. Stomach/Bowel: The stomach is nonenlarged. No dilated small bowel. Diverticular disease of the colon without acute inflammation Vascular/Lymphatic: Advanced aortic atherosclerosis. Infrarenal abdominal aortic aneurysm measuring up to 3.3 cm. Mild aneurysmal dilatation of the common iliac vessels measuring 22 mm right and 21 mm left. No suspicious lymph nodes. Reproductive: Prostatectomy Other: Negative for pelvic effusion or free air. Small fat containing periumbilical hernia. Musculoskeletal: No acute or suspicious osseous abnormality. Multilevel degenerative changes. IMPRESSION: 1. No CT evidence for acute intra-abdominal or pelvic abnormality. 2. Diverticular disease of the colon without acute inflammation. 3. Bilateral kidney stones without hydronephrosis. Multifocal renal cortical scarring. 4. Aortic atherosclerosis. 3.3 cm infrarenal abdominal aortic aneurysm.  Recommend follow-up ultrasound every 3 years. (Ref.: J Vasc Surg. 2018; 67:2-77 and J Am Coll Radiol 2013;10(10):789-794.) 5. Cardiomegaly. Aortic Atherosclerosis (ICD10-I70.0). Electronically Signed   By: Luke Bun M.D.   On: 09/18/2023 17:33   CT Head Wo Contrast Result Date: 09/18/2023 CLINICAL DATA:  Worsening confusion EXAM: CT HEAD WITHOUT CONTRAST TECHNIQUE: Contiguous axial images were obtained from the base of the skull through the vertex without intravenous contrast. RADIATION DOSE REDUCTION: This exam was performed according to the departmental dose-optimization program which includes automated exposure control, adjustment of the mA and/or kV according to patient size and/or use of iterative reconstruction technique. COMPARISON:  MRI 06/30/2023, CT brain 06/29/2023 FINDINGS: Brain: Interval encephalomalacia involving the left frontal and temporal lobes, with involvement of left insula and basal ganglia, consistent with chronic left MCA infarct. Mild ex vacuo dilatation of left lateral ventricle compared to prior. Atrophy and minimal chronic small vessel ischemic changes of the white matter. Vascular: No hyperdense vessels.  Carotid vascular calcification. Skull: Normal. Negative for fracture or focal lesion. Sinuses/Orbits: No acute finding. Other: None IMPRESSION: 1. No CT evidence for acute intracranial abnormality. 2. Chronic left MCA infarct. 3. Atrophy and minimal chronic small vessel ischemic changes of the white matter. Electronically Signed   By: Luke Bun M.D.   On: 09/18/2023 17:17   DG Chest 1 View Result Date: 09/18/2023 CLINICAL DATA:  Altered mental status. EXAM: CHEST  1 VIEW COMPARISON:  June 29, 2023. FINDINGS: Stable cardiomegaly. Elevated left hemidiaphragm is again noted. No acute pulmonary disease. Bony thorax is unremarkable. IMPRESSION: No acute abnormality seen. Electronically Signed   By: Lynwood Landy Raddle M.D.   On: 09/18/2023 16:39    EKG: I independently viewed  the EKG done and my findings are as followed: Atrial fibrillation with RVR and QTc of 528  Assessment/Plan Present on Admission:  Acute metabolic encephalopathy  Acute kidney injury superimposed on stage 4 chronic kidney disease (HCC)  Elevated troponin  Monomorphic ventricular tachycardia (HCC)  COPD (chronic obstructive pulmonary disease) (HCC)  OSA (obstructive sleep apnea)  Type 2 diabetes mellitus with hyperglycemia (HCC)  Prostate cancer (HCC)  Principal Problem:   Acute metabolic encephalopathy Active Problems:   Acute kidney injury superimposed on stage 4 chronic kidney disease (HCC)   Monomorphic ventricular tachycardia (HCC)   OSA (obstructive sleep apnea)   Elevated troponin   Type 2 diabetes mellitus with hyperglycemia (HCC)   COPD (chronic obstructive pulmonary  disease) (HCC)   Prostate cancer (HCC)   Prolonged QT interval   Elevated brain natriuretic peptide (BNP) level   Hypoalbuminemia due to protein-calorie malnutrition (HCC)   Acute on chronic combined systolic and diastolic CHF (congestive heart failure) (HCC)   Permanent atrial fibrillation with RVR (HCC)  Acute metabolic encephalopathy This may be multifactorial including polypharmacy, acute kidney injury CT head without contrast showed no acute intracranial abnormality MRI of brain pending to rule out stroke; consider neurology consult for positive MRI findings*** Continue fall precaution, delirium precaution  Acute kidney injury superimposed on CKD 4 BUN/creatinine 47/2.58, baseline creatinine at 1.4-1.5 Continue gentle hydration Renally adjust medications, avoid nephrotoxic agents/dehydration/hypotension Nephrology was consulted and we shall await further recommendations  Prolonged QT interval QTc 528 ms Avoid QT prolonging drugs Magnesium  level will be checked Repeat EKG in the morning  Elevated troponin Troponin 25 > 23; this is possibly due to type II demand ischemia It was 1158 on 06/24/2023  (he had monomorphic V. tach and underwent t LHC)  Elevated BNP BNP 195.6, this has come down from 495 (06/23/2023)  Hypoalbuminemia Albumin  3.3, protein supplement will be provided  Reported diarrhea Patient has not had any diarrhea since arrival to the ED Daughter-in-law at bedside states that she was just made aware of any diarrhea today. Continue to monitor for diarrhea and consider C. difficile and GI stool panel  History of monomorphic V. Tach Continue ASA 81 mg, atorvastatin  10 mg daily Continue home edoxaban  30 mg daily for stroke risk reduction. Amiodarone  will be temporarily held at this time due to prolonged QT interval Dapagliflozin  will be temporarily held due to kidney status  Acute on chronic combined systolic and diastolic congestive heart failure. Current Echo with LVEF 45-50%  Continue home meds   Permanent atrial fibrillation with RVR Continue home edoxaban  Amiodarone  temporarily held due to prolonged QT interval  COPD (not in acute exacerbation) Continue home meds  OSA (obstructive sleep apnea) Continue CPAP nightly   Type I diabetes mellitus with hyperglycemia Hemoglobin A1c about 2 months ago was 6.9 Hold home Ozempic  and Farxiga  Continue ISS and hypoglycemia protocol  Metastatic prostate cancer (stage IVb) Continue home Xtandi  Patient follows up with Dr. Babara Call with last office visit being on 7/23  DVT prophylaxis: Edoxaban   Code Status: Full code  Family Communication: Daughter-in-law at bedside (all questions answered to satisfaction)  Consults: Nephrology  Severity of Illness: The appropriate patient status for this patient is INPATIENT. Inpatient status is judged to be reasonable and necessary in order to provide the required intensity of service to ensure the patient's safety. The patient's presenting symptoms, physical exam findings, and initial radiographic and laboratory data in the context of their chronic comorbidities is felt to  place them at high risk for further clinical deterioration. Furthermore, it is not anticipated that the patient will be medically stable for discharge from the hospital within 2 midnights of admission.   * I certify that at the point of admission it is my clinical judgment that the patient will require inpatient hospital care spanning beyond 2 midnights from the point of admission due to high intensity of service, high risk for further deterioration and high frequency of surveillance required.*  Author: Keslyn Teater, DO 09/18/2023 8:41 PM  For on call review [email or web address].

## 2023-09-18 NOTE — ED Triage Notes (Signed)
 Patient to ED via [facility] from home. Family called EMS because the last several days the patient has been grimacing and holding his L abdomen. The patient does have Hx of stroke and has some deficits from that but family says he has been more altered than normal. Family also says the patient has had trouble using the bathroom. Family believes the patient may have a possible UTI due to history of them. Patient is HOH and currently A&Ox1 to self.  EMS Vitals: 117/825 EtCO2 30 105 HR CBG 118

## 2023-09-18 NOTE — ED Notes (Signed)
 Fall risk precautions in place.

## 2023-09-18 NOTE — ED Provider Notes (Signed)
 [facility] Provider Note    Event Date/Time   First MD Initiated Contact with Patient 09/18/23 1544     (approximate)   History   Abdominal Pain and Altered Mental Status   HPI  [name] is a 89 y.o. male  hx of permanent A-fib, hypertension, CKD 4, DVT, sss s/p leadless pacemaker 11/2020, CVA 06/2023 (with moderate cognition defects), VT on amiodarone , metastatic prostate cancer, abdominal hernia who presents from home with 2 weeks of progressively worsening confusion, falls, concern for medication mismanagement and worsening diarrhea.  History initially obtained from EMS and then secondary patient's daughter who arrives to bedside afterwards.  Patient is normally alert and oriented x 3 and is able to take care of most of his ADLs.  He lives independently with his wife who has dementia with family checking in on them.  For the past 2 weeks they have noticed sudden changes cognition with frequent bouts of diarrhea.  They have noticed that he has not been taking his pills or too many pills (including his amiodarone ) secondary to what is left in his pillbox.  He attempted to work with OT today who found him more confused and covered with stool and was subsequently sent to our facility.      Physical Exam   Triage Vital Signs: ED Triage Vitals [09/18/23 1546]  Encounter Vitals Group     BP      Girls Systolic BP Percentile      Girls Diastolic BP Percentile      Boys Systolic BP Percentile      Boys Diastolic BP Percentile      Pulse      Resp      Temp      Temp src      SpO2      Weight 207 lb 3.7 oz (94 kg)     Height 6' 1 (1.854 m)     Head Circumference      Peak Flow      Pain Score 0     Pain Loc      Pain Education      Exclude from Growth Chart     Most recent vital signs: Vitals:   09/18/23 1549 09/18/23 1840  BP: (!) 121/91 120/82  Pulse: 84 78  Resp: 20 18  Temp: (!) 97.4 F (36.3 C)   SpO2: 99% 96%    Nursing Triage  Note reviewed. Vital signs reviewed and patients oxygen  saturation is normoxic  General: Patient is well nourished, well developed, awake and alert, appears deconditioned Head: Normocephalic and atraumatic Eyes: Normal inspection, extraocular muscles intact, no conjunctival pallor Ear, nose, throat: Normal external exam Neck: Normal range of motion Respiratory: Patient is in no respiratory distress, lungs CTAB Cardiovascular: Patient is not tachycardic, RR GI: Abd SNT with no guarding or rebound  Does have a small ventral hernia but easily reducible Extremities: pulses intact with good cap refills, 1+ LE pitting edema no calf tenderness Neuro: The patient is alert and oriented to person, not to place or time and very confused.  Strength 5 out of 5 in upper extremities, 4 out of 5 in lower extremities bilaterally, sensation grossly intact Skin: Skin breakdown in the groin region but no deep ulcerations   ED Results / Procedures / Treatments   Labs (all labs ordered are listed, but only abnormal results are displayed) Labs Reviewed  CBC WITH DIFFERENTIAL/PLATELET - Abnormal; Notable for the following components:  Result Value   RDW 16.2 (*)    All other components within normal limits  COMPREHENSIVE METABOLIC PANEL WITH GFR - Abnormal; Notable for the following components:   Glucose, Bld 134 (*)    BUN 47 (*)    Creatinine, Ser 2.58 (*)    Albumin  3.3 (*)    GFR, Estimated 23 (*)    All other components within normal limits  BRAIN NATRIURETIC PEPTIDE - Abnormal; Notable for the following components:   B Natriuretic Peptide 195.6 (*)    All other components within normal limits  URINALYSIS, COMPLETE (UACMP) WITH MICROSCOPIC - Abnormal; Notable for the following components:   Color, Urine YELLOW (*)    APPearance CLEAR (*)    Glucose, UA >=500 (*)    Hgb urine dipstick MODERATE (*)    All other components within normal limits  TROPONIN I (HIGH SENSITIVITY) - Abnormal; Notable  for the following components:   Troponin I (High Sensitivity) 25 (*)    All other components within normal limits  LACTIC ACID, PLASMA  PROCALCITONIN  BLOOD GAS, VENOUS  TSH  T4, FREE  TROPONIN I (HIGH SENSITIVITY)     EKG EKG and rhythm strip are interpreted by myself:   EKG: Wide A-fib] at heart rate of 103, wide QRS duration, QTc 528, nonspecific ST segments and T waves no ectopy EKG not consistent with Acute STEMI Rhythm strip: wide afib in lead II   RADIOLOGY CT head: No intracranial hemorrhage on my independent review interpretation X-ray chest: No acute abnormality on my independent review interpretation CT abdomen pelvis with IV contrast: No acute abnormality however does have multiple incidental findings including a small AAA, nonobstructing stones each which was discussed with the patient's daughter-in-law    PROCEDURES:  Critical Care performed: No  Procedures   MEDICATIONS ORDERED IN ED: Medications - No data to display   IMPRESSION / MDM / ASSESSMENT AND PLAN / ED COURSE                                Differential diagnosis includes, but is not limited to, intracranial hemorrhage, UTI, colitis, diverticulitis, anemia   ED course: Patient presents and reassured that abdomen demonstrates no evidence of peritonitis.  He is very confused but with no new focal neurological deficits.  Per family this is an acute change.  No infectious source identified but it is surprising to me that patient's creatinine has been significantly elevated over the past 2 months.  He was supposed to establish care with nephrology but has not been able to do so.  At this time I do not have a cause for the patient's symptoms but given his history of prior strokes it might be reasonable to obtain a repeat MRI brain given his encephalopathy (L.  He is certainly out of the window for any acute intervention).  Will discuss case for admission with hospitalist.    Clinical Course as of  09/18/23 1904  Thu Sep 18, 2023  1629 Family here: lives at home with demented wife, thinks taking too much amiodarone . SW involved. More confused. Skin tears.  [HD]  1644 Creatinine(!): 2.58 Too elevated for CT with contrast. Will make non con, 1 month ago this was 2.4 her arrival 2 months ago it was 1.43  [HD]  1754 Anion gap: 12 No anion gap [HD]  1806 T4,Free(Direct): 0.98 Not elevated [HD]    Clinical Course User Index [HD] Nicholaus,  Rolland BRAVO, MD  -- Data(2/3 categories following were performed): I reviewed or ordered at least three unique tests, external notes, and/or the history required an independent historian as one of the three requirements as following: At least 3 labs/imaging studies were obtained and/or reviewed. AND  I discussed the management of the patient with the following external physician or qualified healthcare provider: Admitting physician  Risk: This patient has a high risk of morbidity due to further diagnostic testing or treatment. Rationale: Decision made regarding admission  Admit Level 5 - Labs/Rads, Admit, Consult:  Suggested E/M Coding Level: 5, 99285  This level has been selected based on the 2021-10-09 CPT guidelines for E/M codes in the Emergency Department based on 2/3 of the CoPA, Data, and Risk.    FINAL CLINICAL IMPRESSION(S) / ED DIAGNOSES   Final diagnoses:  Acute encephalopathy  Skin breakdown  Non compliance w medication regimen  Acute renal insufficiency     Rx / DC Orders   ED Discharge Orders     None        Note:  This document was prepared using Dragon voice recognition software and may include unintentional dictation errors.   Nicholaus Rolland BRAVO, MD 09/18/23 JUDITHANN

## 2023-09-19 ENCOUNTER — Inpatient Hospital Stay

## 2023-09-19 DIAGNOSIS — Z8719 Personal history of other diseases of the digestive system: Secondary | ICD-10-CM

## 2023-09-19 DIAGNOSIS — Z86718 Personal history of other venous thrombosis and embolism: Secondary | ICD-10-CM

## 2023-09-19 DIAGNOSIS — G9341 Metabolic encephalopathy: Secondary | ICD-10-CM | POA: Diagnosis not present

## 2023-09-19 DIAGNOSIS — Z8673 Personal history of transient ischemic attack (TIA), and cerebral infarction without residual deficits: Secondary | ICD-10-CM

## 2023-09-19 LAB — PROTIME-INR
INR: 1.4 — ABNORMAL HIGH (ref 0.8–1.2)
Prothrombin Time: 18.2 s — ABNORMAL HIGH (ref 11.4–15.2)

## 2023-09-19 LAB — COMPREHENSIVE METABOLIC PANEL WITH GFR
ALT: 9 U/L (ref 0–44)
AST: 19 U/L (ref 15–41)
Albumin: 2.9 g/dL — ABNORMAL LOW (ref 3.5–5.0)
Alkaline Phosphatase: 100 U/L (ref 38–126)
Anion gap: 9 (ref 5–15)
BUN: 46 mg/dL — ABNORMAL HIGH (ref 8–23)
CO2: 24 mmol/L (ref 22–32)
Calcium: 9.1 mg/dL (ref 8.9–10.3)
Chloride: 104 mmol/L (ref 98–111)
Creatinine, Ser: 2.44 mg/dL — ABNORMAL HIGH (ref 0.61–1.24)
GFR, Estimated: 25 mL/min — ABNORMAL LOW (ref >60)
Glucose, Bld: 186 mg/dL — ABNORMAL HIGH (ref 70–99)
Potassium: 4.2 mmol/L (ref 3.5–5.1)
Sodium: 137 mmol/L (ref 135–145)
Total Bilirubin: 0.6 mg/dL (ref 0.0–1.2)
Total Protein: 6.8 g/dL (ref 6.5–8.1)

## 2023-09-19 LAB — CBC
HCT: 45 % (ref 39.0–52.0)
Hemoglobin: 14.7 g/dL (ref 13.0–17.0)
MCH: 31.1 pg (ref 26.0–34.0)
MCHC: 32.7 g/dL (ref 30.0–36.0)
MCV: 95.1 fL (ref 80.0–100.0)
Platelets: 163 K/uL (ref 150–400)
RBC: 4.73 MIL/uL (ref 4.22–5.81)
RDW: 16 % — ABNORMAL HIGH (ref 11.5–15.5)
WBC: 5.9 K/uL (ref 4.0–10.5)
nRBC: 0 % (ref 0.0–0.2)

## 2023-09-19 LAB — BLOOD GAS, VENOUS
Bicarbonate: 27 mmol/L (ref 20.0–28.0)
O2 Saturation: 27.6 mmol/L (ref 0.0–2.0)
Patient temperature: 37
Patient temperature: 37
pCO2, Ven: 50 mmHg (ref 44–60)
pH, Ven: 7.34 (ref 7.25–7.43)
pO2, Ven: 27 mmol/L (ref 32–45)

## 2023-09-19 LAB — GLUCOSE, CAPILLARY
Glucose-Capillary: 133 mg/dL — ABNORMAL HIGH (ref 70–99)
Glucose-Capillary: 125 mg/dL — ABNORMAL HIGH (ref 70–99)
Glucose-Capillary: 159 mg/dL — ABNORMAL HIGH (ref 70–99)
Glucose-Capillary: 160 mg/dL — ABNORMAL HIGH (ref 70–99)
Glucose-Capillary: 109 mg/dL — ABNORMAL HIGH (ref 70–99)
Glucose-Capillary: 147 mg/dL — ABNORMAL HIGH (ref 70–99)

## 2023-09-19 LAB — HEPARIN LEVEL (UNFRACTIONATED): Heparin Unfractionated: >1.1 [IU]/mL — ABNORMAL HIGH (ref 0.30–0.70)

## 2023-09-19 LAB — APTT: aPTT: 34 s (ref 24–36)

## 2023-09-19 LAB — RESP PANEL BY RT-PCR (RSV, FLU A&B, COVID)  RVPGX2
Influenza A by PCR: NEGATIVE
Influenza B by PCR: NEGATIVE
Resp Syncytial Virus by PCR: NEGATIVE
SARS Coronavirus 2 by RT PCR: NEGATIVE

## 2023-09-19 LAB — MAGNESIUM: Magnesium: 2.3 mg/dL (ref 1.7–2.4)

## 2023-09-19 LAB — VITAMIN B12: Vitamin B-12: 718 pg/mL (ref 180–914)

## 2023-09-19 LAB — PHOSPHORUS: Phosphorus: 3 mg/dL (ref 2.5–4.6)

## 2023-09-19 MED ORDER — AMIODARONE HCL 200 MG PO TABS
200.0000 mg | ORAL_TABLET | Freq: Every day | ORAL | Status: DC
  Administered 2023-09-19 – 2023-10-01 (×12): 200 mg via ORAL
  Filled 2023-09-19 (×12): qty 1

## 2023-09-19 MED ORDER — THIAMINE HCL 100 MG/ML IJ SOLN
500.0000 mg | Freq: Three times a day (TID) | INTRAVENOUS | Status: AC
  Administered 2023-09-19 – 2023-09-22 (×9): 500 mg via INTRAVENOUS
  Filled 2023-09-19 (×9): qty 5

## 2023-09-19 MED ORDER — METOPROLOL TARTRATE 25 MG PO TABS
37.5000 mg | ORAL_TABLET | Freq: Two times a day (BID) | ORAL | Status: DC
  Administered 2023-09-19 – 2023-09-23 (×8): 37.5 mg via ORAL
  Filled 2023-09-19 (×10): qty 2

## 2023-09-19 MED ORDER — HEPARIN (PORCINE) 25000 UT/250ML-% IV SOLN: 1400.0000 [IU]/h | INTRAVENOUS | Status: DC

## 2023-09-19 MED ORDER — LACTATED RINGERS IV SOLN: INTRAVENOUS | Status: AC

## 2023-09-19 NOTE — Progress Notes (Shared)
   09/19/23 1932  Vitals  Temp 98.3 F (36.8 C)  BP 126/78  MAP (mmHg) 88  BP Location Left Arm  BP Method Automatic  Patient Position (if appropriate) Sitting  Pulse Rate 69  Pulse Rate Source Monitor  Resp 18  MEWS COLOR  MEWS Score Color Green  Oxygen  Therapy  SpO2 100 %  O2 Device Room Air

## 2023-09-19 NOTE — Progress Notes (Signed)
 [facility]  ROUNDING NOTE   Subjective:   ROSBEL Werner is a 89 y.o. male with past medical history of atrial fibrillation, hypertension, aortic atherosclerosis, COPD, obstructive sleep apnea, CKD, osteoarthritis, diabetes, prostate cancer treated with Xtandi .  Patient presents to the emergency department for evaluation of left abdominal pain.  Patient has been admitted for Acute renal insufficiency [N28.9] Acute encephalopathy [G93.40] Skin breakdown [R23.8] Non compliance w medication regimen [Z91.148] Acute metabolic encephalopathy [G93.41]  Patient is known to our practice followed outpatient by Dr. Dennise.  Patient was last seen in office in February of this year.  Due to patient's altered mental status and lack of family presence, chart review used to obtain history.  EMS was contacted by family due to multiple days of grimacing and left abdominal pain.  Patient also noted to be more confused.  Family also stated trouble using the bathroom.  Patient is seen sitting up in bed.  Responds to name however unable to answer simple questioning such as his birthday or his full name.  Labs on ED arrival concerning for BUN of 47, creatinine 0.98, GFR 23, BNP 190, troponin 25. UA appears clear.    Objective:  Vital signs in last 24 hours:  Temp:  [97.4 F (36.3 C)-98.4 F (36.9 C)] 97.5 F (36.4 C) (08/29 0742) Pulse Rate:  [40-87] 40 (08/29 0742) Resp:  [16-20] 16 (08/29 0742) BP: (110-124)/(67-91) 114/67 (08/29 0742) SpO2:  [94 %-99 %] 97 % (08/29 0742) Weight:  [94 kg] 94 kg (08/28 1546)  Weight change:  Filed Weights   09/18/23 1546  Weight: 94 kg    Intake/Output: I/O last 3 completed shifts: In: 693.6 [I.V.:693.6] Out: 200 [Urine:200]   Intake/Output this shift:  No intake/output data recorded.  Physical Exam: General: NAD  Head: Normocephalic, atraumatic. Moist oral mucosal membranes  Eyes: Anicteric  Lungs:  Clear to auscultation. Normal effort   Heart: Regular rate and rhythm  Abdomen:  Soft, nontender  Extremities:  No peripheral edema.  Neurologic: Awake, alert, confused  Skin: Warm,dry, no rash       Basic Metabolic Panel: Recent Labs  Lab 09/18/23 1554 09/19/23 0352  NA 138 137  K 4.2 4.2  CL 103 104  CO2 23 24  GLUCOSE 134* 186*  BUN 47* 46*  CREATININE 2.58* 2.44*  CALCIUM  9.4 9.1  MG  --  2.3  PHOS  --  3.0    Liver Function Tests: Recent Labs  Lab 09/18/23 1554 09/19/23 0352  AST 19 19  ALT 12 9  ALKPHOS 106 100  BILITOT 0.8 0.6  PROT 7.5 6.8  ALBUMIN  3.3* 2.9*   No results for input(s): LIPASE, AMYLASE in the last 168 hours. No results for input(s): AMMONIA in the last 168 hours.  CBC: Recent Labs  Lab 09/18/23 1554 09/18/23 2332 09/19/23 0352  WBC 7.5  --  5.9  NEUTROABS 5.4  --   --   HGB 15.6 14.9 14.7  HCT 47.6 45.1 45.0  MCV 94.6  --  95.1  PLT 181  --  163    Cardiac Enzymes: No results for input(s): CKTOTAL, CKMB, CKMBINDEX, TROPONINI in the last 168 hours.  BNP: Invalid input(s): POCBNP  CBG: Recent Labs  Lab 09/18/23 2137 09/19/23 0744 09/19/23 0933 09/19/23 1139  GLUCAP 110* 133* 125* 159*    Microbiology: Results for orders placed or performed during the hospital encounter of 06/28/23  MRSA Next Gen by PCR, Nasal     Status: None  Collection Time: 06/28/23  5:14 PM   Specimen: Nasal Mucosa; Nasal Swab  Result Value Ref Range Status   MRSA by PCR Next Gen NOT DETECTED NOT DETECTED Final    Comment: (NOTE) The GeneXpert MRSA Assay (FDA approved for NASAL specimens only), is one component of a comprehensive MRSA colonization surveillance program. It is not intended to diagnose MRSA infection nor to guide or monitor treatment for MRSA infections. Test performance is not FDA approved in patients less than 41 years old. Performed at [facility], [address], [place], [place] [address]    *Note: Due to a large number of results  and/or encounters for the requested time period, some results have not been displayed. A complete set of results can be found in Results Review.    Coagulation Studies: No results for input(s): LABPROT, INR in the last 72 hours.  Urinalysis: Recent Labs    09/18/23 1554  COLORURINE YELLOW*  LABSPEC 1.013  PHURINE 5.0  GLUCOSEU >=500*  HGBUR MODERATE*  BILIRUBINUR NEGATIVE  KETONESUR NEGATIVE  PROTEINUR NEGATIVE  NITRITE NEGATIVE  LEUKOCYTESUR NEGATIVE      Imaging: MR BRAIN WO CONTRAST Result Date: 09/19/2023 CLINICAL DATA:  89 year old male with increasing confusion. Status post left MCA infarct, left MCA M1 occlusion in June. EXAM: MRI HEAD WITHOUT CONTRAST TECHNIQUE: Multiplanar, multiecho pulse sequences of the brain and surrounding structures were obtained without intravenous contrast. COMPARISON:  Head CT yesterday.  Brain MRI 06/30/2023, and earlier. FINDINGS: Brain: Expected evolution of left MCA territory encephalomalacia since 06/30/2023. Some T2 shine through on diffusion. Hemosiderin. Laminar necrosis. Ex vacuo ventricular enlargement on that side. No midline shift. Pronounced chronic mineralization, susceptibility in the bilateral lentiform. Associated artifact on DWI. No convincing restricted diffusion. No midline shift, mass effect, evidence of mass lesion, extra-axial collection or acute intracranial hemorrhage. Cervicomedullary junction and pituitary are within normal limits. Stable gray and white matter signal elsewhere, mild for age white matter T2 and FLAIR hyperintensity. Suspected chronic microhemorrhage in the left brainstem series 12, image 15. Vascular: Major intracranial vascular flow voids are stable. Skull and upper cervical spine: Negative. Visualized bone marrow signal is within normal limits. Sinuses/Orbits: Stable, negative. Other: Visible internal auditory structures appear normal. Mastoids remain well aerated. IMPRESSION: 1. No acute intracranial  abnormality. 2. Expected evolution of Left MCA territory encephalomalacia since June. Otherwise mild for age chronic small vessel disease. Electronically Signed   By: VEAR Hurst M.D.   On: 09/19/2023 08:34   CT ABDOMEN PELVIS WO CONTRAST Result Date: 09/18/2023 CLINICAL DATA:  Worsening abdominal pain history of metastatic prostate cancer EXAM: CT ABDOMEN AND PELVIS WITHOUT CONTRAST TECHNIQUE: Multidetector CT imaging of the abdomen and pelvis was performed following the standard protocol without IV contrast. RADIATION DOSE REDUCTION: This exam was performed according to the departmental dose-optimization program which includes automated exposure control, adjustment of the mA and/or kV according to patient size and/or use of iterative reconstruction technique. COMPARISON:  CT 07/06/2023, 07/03/2023, 02/15/2018, 08/19/2016 FINDINGS: Lower chest: Lung bases demonstrate no acute airspace disease. Cardiomegaly. Leadless pacemaker in the right ventricle. Hepatobiliary: Hepatic cysts. Isodense exophytic lesion off the right hepatic lobe measures about 4.5 cm, likely represents complex cyst and is stable compared with multiple prior exams. No calcified gallstone or biliary dilatation Pancreas: Unremarkable. No pancreatic ductal dilatation or surrounding inflammatory changes. Spleen: Normal in size without focal abnormality. Adrenals/Urinary Tract: Adrenal glands are stable in appearance. Multifocal cortical scarring in the bilateral kidneys. Bilateral kidney stones. No hydronephrosis. The  bladder appears slightly thick-walled. Stomach/Bowel: The stomach is nonenlarged. No dilated small bowel. Diverticular disease of the colon without acute inflammation Vascular/Lymphatic: Advanced aortic atherosclerosis. Infrarenal abdominal aortic aneurysm measuring up to 3.3 cm. Mild aneurysmal dilatation of the common iliac vessels measuring 22 mm right and 21 mm left. No suspicious lymph nodes. Reproductive: Prostatectomy Other:  Negative for pelvic effusion or free air. Small fat containing periumbilical hernia. Musculoskeletal: No acute or suspicious osseous abnormality. Multilevel degenerative changes. IMPRESSION: 1. No CT evidence for acute intra-abdominal or pelvic abnormality. 2. Diverticular disease of the colon without acute inflammation. 3. Bilateral kidney stones without hydronephrosis. Multifocal renal cortical scarring. 4. Aortic atherosclerosis. 3.3 cm infrarenal abdominal aortic aneurysm. Recommend follow-up ultrasound every 3 years. (Ref.: J Vasc Surg. 2018; 67:2-77 and J Am Coll Radiol 2013;10(10):789-794.) 5. Cardiomegaly. Aortic Atherosclerosis (ICD10-I70.0). Electronically Signed   By: Luke Bun M.D.   On: 09/18/2023 17:33   CT Head Wo Contrast Result Date: 09/18/2023 CLINICAL DATA:  Worsening confusion EXAM: CT HEAD WITHOUT CONTRAST TECHNIQUE: Contiguous axial images were obtained from the base of the skull through the vertex without intravenous contrast. RADIATION DOSE REDUCTION: This exam was performed according to the departmental dose-optimization program which includes automated exposure control, adjustment of the mA and/or kV according to patient size and/or use of iterative reconstruction technique. COMPARISON:  MRI 06/30/2023, CT brain 06/29/2023 FINDINGS: Brain: Interval encephalomalacia involving the left frontal and temporal lobes, with involvement of left insula and basal ganglia, consistent with chronic left MCA infarct. Mild ex vacuo dilatation of left lateral ventricle compared to prior. Atrophy and minimal chronic small vessel ischemic changes of the white matter. Vascular: No hyperdense vessels.  Carotid vascular calcification. Skull: Normal. Negative for fracture or focal lesion. Sinuses/Orbits: No acute finding. Other: None IMPRESSION: 1. No CT evidence for acute intracranial abnormality. 2. Chronic left MCA infarct. 3. Atrophy and minimal chronic small vessel ischemic changes of the white  matter. Electronically Signed   By: Luke Bun M.D.   On: 09/18/2023 17:17   DG Chest 1 View Result Date: 09/18/2023 CLINICAL DATA:  Altered mental status. EXAM: CHEST  1 VIEW COMPARISON:  June 29, 2023. FINDINGS: Stable cardiomegaly. Elevated left hemidiaphragm is again noted. No acute pulmonary disease. Bony thorax is unremarkable. IMPRESSION: No acute abnormality seen. Electronically Signed   By: Lynwood Landy Raddle M.D.   On: 09/18/2023 16:39     Medications:    lactated ringers      thiamine  (VITAMIN B1) injection      amiodarone   200 mg Oral Daily   atorvastatin   10 mg Oral Daily   feeding supplement (GLUCERNA SHAKE)  237 mL Oral TID BM   insulin  aspart  0-9 Units Subcutaneous TID WC   metoprolol  tartrate  37.5 mg Oral BID   pantoprazole   40 mg Oral Daily   umeclidinium bromide   1 puff Inhalation Daily   verapamil   120 mg Oral QHS   acetaminophen  **OR** acetaminophen , prochlorperazine   Assessment/ Plan:  [name] is a 89 y.o.  male with past medical history of atrial fibrillation, hypertension, aortic atherosclerosis, COPD, obstructive sleep apnea, CKD, osteoarthritis, diabetes, prostate cancer treated with Xtandi .  Patient presents to the emergency department for evaluation of left abdominal pain.  Patient has been admitted for Acute renal insufficiency [N28.9] Acute encephalopathy [G93.40] Skin breakdown [R23.8] Non compliance w medication regimen [Z91.148] Acute metabolic encephalopathy [G93.41]   Acute Kidney Injury on chronic kidney disease stage IIIa with baseline creatinine 1.40 and GFR of  48 on 07/06/23.  Acute kidney injury secondary poor oral intake from confusion. Chronic kidney disease is secondary to hypertension and diabetes CT abd pelvis shows bilateral kidney stones without obstruction. No recent IV contrast exposure. Has received IV fluids during this admission. Will continue to monitor peripherally during this admission.   Lab Results  Component  Value Date   CREATININE 2.44 (H) 09/19/2023   CREATININE 2.58 (H) 09/18/2023   CREATININE 2.49 (H) 08/13/2023    Intake/Output Summary (Last 24 hours) at 09/19/2023 1241 Last data filed at 09/19/2023 0900 Gross per 24 hour  Intake 693.63 ml  Output 200 ml  Net 493.63 ml   2. Anemia of chronic kidney disease Lab Results  Component Value Date   HGB 14.7 09/19/2023    Hgb acceptable for this patient. No need of ESA during this time.   3. Hypertension with chronic kidney disease. Home regimen includes metoprolol  and verapamil . These have been resumed during this admission.   4. Secondary Hyperparathyroidism: with outpatient labs: PTH 1.2, phosphorus 3.5, calcium  9.9 on 02/26/23.   Lab Results  Component Value Date   CALCIUM  9.1 09/19/2023   CAION 1.22 06/28/2023   PHOS 3.0 09/19/2023    Will continue to monitor bone minerals.    LOS: 1 Aalayah Riles 8/29/202512:41 PM

## 2023-09-19 NOTE — Progress Notes (Signed)
  Device system confirmed Patient has a [employer] pacer; conditional   Device last cleared by EP Provider: Lesia Sharper PA 09/19/2023  Clearance is good through for 1 year as long as parameters remain stable at time of check. If pt undergoes a cardiac device procedure during that time, they should be re-cleared.   Tachy-therapies to be programmed off if applicable with device back to pre-MRI settings after completion of exam.  [employer] - Programming recommendation received through [employer] App/Tablet  [name]  09/19/2023 7:07 AM

## 2023-09-19 NOTE — Plan of Care (Signed)

## 2023-09-19 NOTE — Progress Notes (Addendum)
 PROGRESS NOTE    [name]  FMW:[id number] DOB: 09/24/1933 DOA: 09/18/2023 PCP: Liana Fish, NP  Outpatient Specialists: cardiology, oncology    Brief Narrative:   From admission h and p  [name] is a 89 y.o. male with medical history significant of hypertension, hyperlipidemia, T2DM, permanent atrial fibrillation on edoxaban , SSS s/p PPM, HFrEF with EF of 45 to 50% -06/24/2023), V-Tach on amiodarone , stage IVb metastatic prostate cancer on Xtandi  and leuprolide  who presents to the emergency department from home via EMS due to 2-week onset of progressive worsening confusion.  Patient was unable to provide history, history was obtained from EDP, ED medical record and daughter-in-law at bedside.  Per report, patient lives at home with wife who has dementia and he was able to take care of his ADLs and care for his wife.  However, since past 2-3 weeks, family checked on him from time to time noted changes in cognition and several episodes of diarrhea.  It was also noted that he has not been taking his medications including amiodarone  as prescribed (he was taking more pills than he was supposed to), assumptions made based on number of pills in his pillbox.  His occupational therapist followed up with him today and noted he was covered in stool and was more confused, so he requested for him to be taken to the ED for further evaluation and management.   Patient was admitted from 6/2 to 6/6 due to monomorphic V. tach during which he underwent t LHC with Dr. Ammon (06/03) and revealed 40% stenosed Ost/prox LAD, mild LV systolic dysfunction.  He was also admitted from 6/7 to 6/20 due to left MCA infarct with left M1 occlusion s/p mechanical thrombectomy with TICI3 revascularization, etiology likely A-fib on DOAC in the setting of recent cardioversion.    Assessment & Plan:   Principal Problem:   Acute metabolic encephalopathy Active Problems:   Acute kidney injury superimposed  on stage 4 chronic kidney disease (HCC)   Monomorphic ventricular tachycardia (HCC)   Essential hypertension   OSA (obstructive sleep apnea)   Elevated troponin   Type 2 diabetes mellitus with hyperglycemia (HCC)   COPD (chronic obstructive pulmonary disease) (HCC)   Prostate cancer (HCC)   (HFpEF) heart failure with preserved ejection fraction (HCC)   S/P placement of leadless cardiac pacemaker   Prolonged QT interval   Elevated brain natriuretic peptide (BNP) level   Hypoalbuminemia due to protein-calorie malnutrition (HCC)   Acute on chronic combined systolic and diastolic CHF (congestive heart failure) (HCC)   Permanent atrial fibrillation with RVR (HCC)   History of ischemic stroke   History of DVT (deep vein thrombosis)   History of GI bleed  # Acute encephalopathy Likely multifactorial, recent stroke biggest contributor. W/u here unrevealing save for AKI. MRI nothing acute, no anemia, no electrolyte abnormalities, normal tsh, normal LFTs, doesn't appear infected. Report of diarrhea at home but none here. No hypercarbia. Vbg wnl. - covid swab - gi pathogen panel and c diff if diarrhea - start fluids - f/u b12, AM cortisol, blood cultures - will order 3 days high-dose thiamine   # Hematochezia? PT reports stool today with reddish hue. Does have history GI bleed. Also has history of recent stroke when home edoxaban  was temporarily discontinued - will hold edoxaban  and start heparin   # Debility Resides independently at home with wife who has dementia, will require at minimum significantly more support at home - pt consult, family interested in rehab if qualifies  #  AKI on ckd 3a Baseline cr around 1.4, here 2s was also there last month. No obstruction on CT - gentle fluids  # Hx v-tach Has pacer - cont home amio  # T2DM Euglycemic - hold home farxiga   # PAD # Non-obstructive CAD - home statin - anticoagulation as above  # HFpEF Doesn't appear exacerbated, may be  dehydrated - hold home farxiga , lasix  for now  - cont home metop  # A-fib Rate controlled - cont home metop, verapamil  - anticoagulation as above  # Hx DVT - anticoagulation as above  # Metastatic prostate cancer Dr Babara ok with holding home xtandi  while hospitalized  # COPD Quiescent - incruse for home spiriva   DVT prophylaxis: home edoxaban  Code Status: full, confirmed by daughter-in-law who is hcpoa updated telephonically 8/29 (wife has dementia) Family Communication: daughter-in-law  Level of care: Telemetry Medical Status is: Inpatient Remains inpatient appropriate because: severity of illness    Consultants:  none  Procedures: none  Antimicrobials:  none    Subjective: No complaints  Objective: Vitals:   09/18/23 2018 09/19/23 0116 09/19/23 0410 09/19/23 0742  BP: 124/79 110/67 111/78 114/67  Pulse: (!) 51 87 67 (!) 40  Resp: 20 20 20 16   Temp: 97.9 F (36.6 C)  98.4 F (36.9 C) (!) 97.5 F (36.4 C)  TempSrc:    Oral  SpO2: 94% 95% 96% 97%  Weight:      Height:        Intake/Output Summary (Last 24 hours) at 09/19/2023 1108 Last data filed at 09/19/2023 0900 Gross per 24 hour  Intake 693.63 ml  Output 200 ml  Net 493.63 ml   Filed Weights   09/18/23 1546  Weight: 94 kg    Examination:  General exam: Appears calm and comfortable  Respiratory system: Clear to auscultation. Respiratory effort normal. Cardiovascular system: S1 & S2 heard, RRR. Soft systolic murmur Gastrointestinal system: Abdomen is nondistended, soft and nontender.   Central nervous system: Alert and oriented to self, moving all 4 Extremities: Symmetric 5 x 5 power. Trace LE edema Skin: No rashes, lesions or ulcers Psychiatry: pleasantly confused     Data Reviewed: I have personally reviewed following labs and imaging studies  CBC: Recent Labs  Lab 09/18/23 1554 09/18/23 2332 09/19/23 0352  WBC 7.5  --  5.9  NEUTROABS 5.4  --   --   HGB 15.6 14.9 14.7  HCT  47.6 45.1 45.0  MCV 94.6  --  95.1  PLT 181  --  163   Basic Metabolic Panel: Recent Labs  Lab 09/18/23 1554 09/19/23 0352  NA 138 137  K 4.2 4.2  CL 103 104  CO2 23 24  GLUCOSE 134* 186*  BUN 47* 46*  CREATININE 2.58* 2.44*  CALCIUM  9.4 9.1  MG  --  2.3  PHOS  --  3.0   GFR: Estimated Creatinine Clearance: 23.6 mL/min (A) (by C-G formula based on SCr of 2.44 mg/dL (H)). Liver Function Tests: Recent Labs  Lab 09/18/23 1554 09/19/23 0352  AST 19 19  ALT 12 9  ALKPHOS 106 100  BILITOT 0.8 0.6  PROT 7.5 6.8  ALBUMIN  3.3* 2.9*   No results for input(s): LIPASE, AMYLASE in the last 168 hours. No results for input(s): AMMONIA in the last 168 hours. Coagulation Profile: No results for input(s): INR, PROTIME in the last 168 hours. Cardiac Enzymes: No results for input(s): CKTOTAL, CKMB, CKMBINDEX, TROPONINI in the last 168 hours. BNP (last 3 results) No  results for input(s): PROBNP in the last 8760 hours. HbA1C: No results for input(s): HGBA1C in the last 72 hours. CBG: Recent Labs  Lab 09/18/23 2137 09/19/23 0744 09/19/23 0933  GLUCAP 110* 133* 125*   Lipid Profile: No results for input(s): CHOL, HDL, LDLCALC, TRIG, CHOLHDL, LDLDIRECT in the last 72 hours. Thyroid  Function Tests: Recent Labs    09/18/23 1554 09/18/23 1640  TSH 4.330  --   FREET4  --  0.98   Anemia Panel: No results for input(s): VITAMINB12, FOLATE, FERRITIN, TIBC, IRON, RETICCTPCT in the last 72 hours. Urine analysis:    Component Value Date/Time   COLORURINE YELLOW (A) 09/18/2023 1554   APPEARANCEUR CLEAR (A) 09/18/2023 1554   APPEARANCEUR Clear 12/20/2021 1615   LABSPEC 1.013 09/18/2023 1554   LABSPEC 1.015 10/12/2013 1445   PHURINE 5.0 09/18/2023 1554   GLUCOSEU >=500 (A) 09/18/2023 1554   GLUCOSEU 50 mg/dL 90/77/7984 8554   HGBUR MODERATE (A) 09/18/2023 1554   BILIRUBINUR NEGATIVE 09/18/2023 1554   BILIRUBINUR Negative 12/20/2021  1615   BILIRUBINUR Negative 10/12/2013 1445   KETONESUR NEGATIVE 09/18/2023 1554   PROTEINUR NEGATIVE 09/18/2023 1554   UROBILINOGEN 0.2 06/29/2019 1556   NITRITE NEGATIVE 09/18/2023 1554   LEUKOCYTESUR NEGATIVE 09/18/2023 1554   LEUKOCYTESUR Negative 10/12/2013 1445   Sepsis Labs: @LABRCNTIP (procalcitonin:4,lacticidven:4)  )No results found for this or any previous visit (from the past 240 hours).       Radiology Studies: MR BRAIN WO CONTRAST Result Date: 09/19/2023 CLINICAL DATA:  89 year old male with increasing confusion. Status post left MCA infarct, left MCA M1 occlusion in June. EXAM: MRI HEAD WITHOUT CONTRAST TECHNIQUE: Multiplanar, multiecho pulse sequences of the brain and surrounding structures were obtained without intravenous contrast. COMPARISON:  Head CT yesterday.  Brain MRI 06/30/2023, and earlier. FINDINGS: Brain: Expected evolution of left MCA territory encephalomalacia since 06/30/2023. Some T2 shine through on diffusion. Hemosiderin. Laminar necrosis. Ex vacuo ventricular enlargement on that side. No midline shift. Pronounced chronic mineralization, susceptibility in the bilateral lentiform. Associated artifact on DWI. No convincing restricted diffusion. No midline shift, mass effect, evidence of mass lesion, extra-axial collection or acute intracranial hemorrhage. Cervicomedullary junction and pituitary are within normal limits. Stable gray and white matter signal elsewhere, mild for age white matter T2 and FLAIR hyperintensity. Suspected chronic microhemorrhage in the left brainstem series 12, image 15. Vascular: Major intracranial vascular flow voids are stable. Skull and upper cervical spine: Negative. Visualized bone marrow signal is within normal limits. Sinuses/Orbits: Stable, negative. Other: Visible internal auditory structures appear normal. Mastoids remain well aerated. IMPRESSION: 1. No acute intracranial abnormality. 2. Expected evolution of Left MCA territory  encephalomalacia since June. Otherwise mild for age chronic small vessel disease. Electronically Signed   By: VEAR Hurst M.D.   On: 09/19/2023 08:34   CT ABDOMEN PELVIS WO CONTRAST Result Date: 09/18/2023 CLINICAL DATA:  Worsening abdominal pain history of metastatic prostate cancer EXAM: CT ABDOMEN AND PELVIS WITHOUT CONTRAST TECHNIQUE: Multidetector CT imaging of the abdomen and pelvis was performed following the standard protocol without IV contrast. RADIATION DOSE REDUCTION: This exam was performed according to the departmental dose-optimization program which includes automated exposure control, adjustment of the mA and/or kV according to patient size and/or use of iterative reconstruction technique. COMPARISON:  CT 07/06/2023, 07/03/2023, 02/15/2018, 08/19/2016 FINDINGS: Lower chest: Lung bases demonstrate no acute airspace disease. Cardiomegaly. Leadless pacemaker in the right ventricle. Hepatobiliary: Hepatic cysts. Isodense exophytic lesion off the right hepatic lobe measures about 4.5 cm, likely represents complex cyst  and is stable compared with multiple prior exams. No calcified gallstone or biliary dilatation Pancreas: Unremarkable. No pancreatic ductal dilatation or surrounding inflammatory changes. Spleen: Normal in size without focal abnormality. Adrenals/Urinary Tract: Adrenal glands are stable in appearance. Multifocal cortical scarring in the bilateral kidneys. Bilateral kidney stones. No hydronephrosis. The bladder appears slightly thick-walled. Stomach/Bowel: The stomach is nonenlarged. No dilated small bowel. Diverticular disease of the colon without acute inflammation Vascular/Lymphatic: Advanced aortic atherosclerosis. Infrarenal abdominal aortic aneurysm measuring up to 3.3 cm. Mild aneurysmal dilatation of the common iliac vessels measuring 22 mm right and 21 mm left. No suspicious lymph nodes. Reproductive: Prostatectomy Other: Negative for pelvic effusion or free air. Small fat containing  periumbilical hernia. Musculoskeletal: No acute or suspicious osseous abnormality. Multilevel degenerative changes. IMPRESSION: 1. No CT evidence for acute intra-abdominal or pelvic abnormality. 2. Diverticular disease of the colon without acute inflammation. 3. Bilateral kidney stones without hydronephrosis. Multifocal renal cortical scarring. 4. Aortic atherosclerosis. 3.3 cm infrarenal abdominal aortic aneurysm. Recommend follow-up ultrasound every 3 years. (Ref.: J Vasc Surg. 2018; 67:2-77 and J Am Coll Radiol 2013;10(10):789-794.) 5. Cardiomegaly. Aortic Atherosclerosis (ICD10-I70.0). Electronically Signed   By: Luke Bun M.D.   On: 09/18/2023 17:33   CT Head Wo Contrast Result Date: 09/18/2023 CLINICAL DATA:  Worsening confusion EXAM: CT HEAD WITHOUT CONTRAST TECHNIQUE: Contiguous axial images were obtained from the base of the skull through the vertex without intravenous contrast. RADIATION DOSE REDUCTION: This exam was performed according to the departmental dose-optimization program which includes automated exposure control, adjustment of the mA and/or kV according to patient size and/or use of iterative reconstruction technique. COMPARISON:  MRI 06/30/2023, CT brain 06/29/2023 FINDINGS: Brain: Interval encephalomalacia involving the left frontal and temporal lobes, with involvement of left insula and basal ganglia, consistent with chronic left MCA infarct. Mild ex vacuo dilatation of left lateral ventricle compared to prior. Atrophy and minimal chronic small vessel ischemic changes of the white matter. Vascular: No hyperdense vessels.  Carotid vascular calcification. Skull: Normal. Negative for fracture or focal lesion. Sinuses/Orbits: No acute finding. Other: None IMPRESSION: 1. No CT evidence for acute intracranial abnormality. 2. Chronic left MCA infarct. 3. Atrophy and minimal chronic small vessel ischemic changes of the white matter. Electronically Signed   By: Luke Bun M.D.   On:  09/18/2023 17:17   DG Chest 1 View Result Date: 09/18/2023 CLINICAL DATA:  Altered mental status. EXAM: CHEST  1 VIEW COMPARISON:  June 29, 2023. FINDINGS: Stable cardiomegaly. Elevated left hemidiaphragm is again noted. No acute pulmonary disease. Bony thorax is unremarkable. IMPRESSION: No acute abnormality seen. Electronically Signed   By: Lynwood Landy Raddle M.D.   On: 09/18/2023 16:39        Scheduled Meds:  atorvastatin   10 mg Oral Daily   edoxaban   30 mg Oral Daily   feeding supplement (GLUCERNA SHAKE)  237 mL Oral TID BM   insulin  aspart  0-9 Units Subcutaneous TID WC   pantoprazole   40 mg Oral Daily   umeclidinium bromide   1 puff Inhalation Daily   verapamil   120 mg Oral QHS   Continuous Infusions:   LOS: 1 day     Devaughn KATHEE Ban, MD [facility]   If 7PM-7AM, please contact night-coverage [email or web address] Password [facility] 09/19/2023, 11:08 AM   '

## 2023-09-19 NOTE — Consult Note (Signed)
 PHARMACY - ANTICOAGULATION CONSULT NOTE  Pharmacy Consult for heparin  Indication: atrial fibrillation  No Known Allergies  Patient Measurements: Height: 6' 1 (185.4 cm) Weight: 94 kg (207 lb 3.7 oz) IBW/kg (Calculated) : 79.9 HEPARIN  DW (KG): 94  Vital Signs: Temp: 97.5 F (36.4 C) (08/29 0742) Temp Source: Oral (08/29 0742) BP: 114/67 (08/29 0742) Pulse Rate: 40 (08/29 0742)  Labs: Recent Labs    09/18/23 1554 09/18/23 1841 09/18/23 2332 09/19/23 0352  HGB 15.6  --  14.9 14.7  HCT 47.6  --  45.1 45.0  PLT 181  --   --  163  CREATININE 2.58*  --   --  2.44*  TROPONINIHS 25* 23*  --   --     Estimated Creatinine Clearance: 23.6 mL/min (A) (by C-G formula based on SCr of 2.44 mg/dL (H)).   Medical History: Past Medical History:  Diagnosis Date   Acute deep vein thrombosis (DVT) of femoral vein of left lower extremity (HCC)    Atrial fibrillation (HCC)    CHF (congestive heart failure) (HCC)    Chronic kidney disease    Diabetes (HCC)    Hearing loss    Hyperlipidemia    Liver cyst    Nephrolithiasis    OSA (obstructive sleep apnea)    Prostate cancer (HCC)    SSS (sick sinus syndrome) (HCC)    Stroke (HCC)    Valvular heart disease     Medications:  Medications Prior to Admission  Medication Sig Dispense Refill Last Dose/Taking   albuterol  (VENTOLIN  HFA) 108 (90 Base) MCG/ACT inhaler Inhale 2 puffs into the lungs every 4 (four) hours as needed for wheezing or shortness of breath. 18 g 5 Unknown   amiodarone  (PACERONE ) 200 MG tablet Take 1 tablet (200 mg total) by mouth daily. 90 tablet 1 09/18/2023   atorvastatin  (LIPITOR) 10 MG tablet Take 1 tablet (10 mg total) by mouth daily. 90 tablet 1 09/18/2023   Cholecalciferol (VITAMIN D-3 PO) Take 1 capsule by mouth daily.   09/18/2023   dapagliflozin  propanediol (FARXIGA ) 10 MG TABS tablet Take 1 tablet (10 mg total) by mouth daily. 30 tablet 5 09/18/2023   edoxaban  (SAVAYSA ) 30 MG TABS tablet Take 30 mg by mouth  daily.   09/18/2023 Morning   enzalutamide  (XTANDI ) 40 MG tablet Take 4 tablets (160 mg total) by mouth daily. 120 tablet 2 09/17/2023 Bedtime   ferrous sulfate  (FEROSUL) 325 (65 FE) MG tablet Take 1 tablet by mouth daily.   09/18/2023   furosemide  (LASIX ) 20 MG tablet Take 1 tablet (20 mg total) by mouth daily. 90 tablet 3 09/18/2023   Metoprolol  Tartrate 37.5 MG TABS Take 1 tablet by mouth 2 (two) times daily.   09/18/2023   nitroGLYCERIN  (NITROSTAT ) 0.4 MG SL tablet Place 1 tablet (0.4 mg total) under the tongue every 5 (five) minutes as needed for chest pain. 25 tablet 0 Unknown   omeprazole (PRILOSEC) 20 MG capsule Take 20 mg by mouth daily.   09/18/2023   polyethylene glycol powder (GLYCOLAX /MIRALAX ) 17 GM/SCOOP powder Take 17 g by mouth daily as needed for mild constipation. Mix as directed. 238 g 0 Unknown   Semaglutide ,0.25 or 0.5MG /DOS, (OZEMPIC , 0.25 OR 0.5 MG/DOSE,) 2 MG/3ML SOPN Inject 0.5 mg into the skin once a week.   09/15/2023   Tiotropium Bromide  Monohydrate (SPIRIVA  RESPIMAT) 2.5 MCG/ACT AERS Inhale 2 puffs into the lungs daily. 4 g 3 09/17/2023   verapamil  (CALAN -SR) 120 MG CR tablet Take 1 tablet (120 mg  total) by mouth at bedtime. 90 tablet 3 09/17/2023 Bedtime   vitamin B-12 (CYANOCOBALAMIN ) 1000 MCG tablet Take 1 tablet (1,000 mcg total) by mouth daily. 30 tablet 0 09/18/2023   Scheduled:   amiodarone   200 mg Oral Daily   atorvastatin   10 mg Oral Daily   feeding supplement (GLUCERNA SHAKE)  237 mL Oral TID BM   insulin  aspart  0-9 Units Subcutaneous TID WC   metoprolol  tartrate  37.5 mg Oral BID   pantoprazole   40 mg Oral Daily   umeclidinium bromide   1 puff Inhalation Daily   verapamil   120 mg Oral QHS   Infusions:   lactated ringers      thiamine  (VITAMIN B1) injection     PRN: acetaminophen  **OR** acetaminophen , prochlorperazine  Anti-infectives (From admission, onward)    None       Assessment: Patient is an 89 yo M admitted with primary problem of acute metabolic  encephalopathy. PMH significant for hypertension, hyperlipidemia, T2DM, permanent atrial fibrillation on edoxaban , SSS s/p PPM, HFrEF with EF of 45 to 50% -06/24/2023), V-Tach on amiodarone , stage IVb metastatic prostate cancer on Xtandi  and leuprolide . Patient is on edoxaban  at home for Afib, with last dose inpatient on 8/29 @ 0941.   Goal of Therapy:  Heparin  level 0.3-0.7 units/ml aPTT 66-102 seconds Monitor platelets by anticoagulation protocol: Yes   Plan:  - Will not give a bolus due to the recently administered edoxaban  and will start infusion>24 hours post last edoxaban  dose - Will start heparin  infusion tomorrow at 1000 at a rate of 1400 units/hr. - Will check a baseline aPTT, INR, and heparin  level today - Will check aPTT and heparin  level 8 hours after starting heparin  infusion tomorrow - Will continue to dose via aPTT until both levels correlate, then will transition to heparin  level dosing  - Will continue to monitor H&H and platelets with daily CBC and heparin  level    Ransom Blanch PGY-1 Pharmacy Resident  [facility]  09/19/2023 1:10 PM

## 2023-09-19 NOTE — Plan of Care (Signed)

## 2023-09-19 NOTE — Evaluation (Signed)
 Physical Therapy Evaluation Patient Details Name: [name] MRN: [id number] DOB: [birth year] Today's Date: 09/19/2023  History of Present Illness  [name] is a 89 y.o. male with medical history significant of hypertension, hyperlipidemia, T2DM, permanent atrial fibrillation on edoxaban , SSS s/p PPM, HFrEF with EF of 45 to 50% -06/24/2023), V-Tach on amiodarone , stage IVb metastatic prostate cancer on Xtandi  and leuprolide  who presents to the emergency department from home via EMS due to 2-week onset of progressive worsening confusion.   Clinical Impression  Pt admitted with above diagnosis. Pt currently with functional limitations due to the deficits listed below (see PT Problem List). Pt received seated EOB agreeable to PT services. Pt is only A&Ox1 to person. Spoke with DIL on phone prior to entry. She reports since CVA in June pt has had aphasia and R sided weakness but also his cognition has gotten worse over the last 2 months. He is typically alert and oriented. Reports he has been back home with spouse who has dementia and is a household ambulator. Unfortunately DIL can not provide 24/7 assist but was working on getting a PCA. She assists with meds, meals, transportation. DIL has concerns on ability to care for himself and spouse at this time.   To date, pt follows single step commands with multi modal cuing and some hand over hand cuing. Pt fidgeting with telemetry unit needing redirection to stop pulling on leads. Pt reliant on minA and bed features to transfer to EOB. Noted BM in bed. Pt needs minA for STS to RW with hand over hand placement for hands for safe standing. Pt with heavy posterior lean in standing needing CGA to minA+1 on pelvis to maintain neutral standing. NT entering room. Able to assist in pericare while author guarding pt. Post pericare CGA to miNA on RW mgmt to ambulate in room to recliner. Pt with poor use of RW with forward anterior trunk lean with RW outside of  BOS with small steps leading to high falls risk. Difficulty carrying over with cues to complete. Pt situated in recliner with all needs in reach. Pt will benefit from skilled PT services < 3 hours/day due to weakness, ppoor use of AD, and lack of safety awareness.      If plan is discharge home, recommend the following: A little help with walking and/or transfers;A little help with bathing/dressing/bathroom;Assistance with cooking/housework;Direct supervision/assist for financial management;Supervision due to cognitive status;Assist for transportation;Direct supervision/assist for medications management;Help with stairs or ramp for entrance   Can travel by private vehicle   Yes    Equipment Recommendations None recommended by PT  Recommendations for Other Services       Functional Status Assessment Patient has had a recent decline in their functional status and demonstrates the ability to make significant improvements in function in a reasonable and predictable amount of time.     Precautions / Restrictions Precautions Precautions: Fall Restrictions Weight Bearing Restrictions Per Provider Order: No      Mobility  Bed Mobility Overal bed mobility: Needs Assistance Bed Mobility: Supine to Sit     Supine to sit: HOB elevated, Used rails, Min assist     General bed mobility comments: min multi modal cuing for hand placement on bed features to assist Patient Response: Cooperative  Transfers Overall transfer level: Needs assistance Equipment used: Rolling walker (2 wheels) Transfers: Sit to/from Stand Sit to Stand: Min assist           General transfer comment: VC's for hand  placement    Ambulation/Gait Ambulation/Gait assistance: Min assist, Contact guard assist Gait Distance (Feet): 25 Feet Assistive device: Rolling walker (2 wheels) Gait Pattern/deviations: Step-to pattern, Trunk flexed       General Gait Details: minA on RW for managing device and obstacles in  room. Needing multi modal cuing for re-direction  Stairs            Wheelchair Mobility     Tilt Bed Tilt Bed Patient Response: Cooperative  Modified Rankin (Stroke Patients Only)       Balance Overall balance assessment: Needs assistance Sitting-balance support: No upper extremity supported, Feet supported Sitting balance-Leahy Scale: Good       Standing balance-Leahy Scale: Poor Standing balance comment: heavy BUE support on RW. Posterior weight shift onto heels during standing                             Pertinent Vitals/Pain Pain Assessment Pain Assessment: Faces Faces Pain Scale: No hurt    Home Living Family/patient expects to be discharged to:: Private residence Living Arrangements: Spouse/significant other Available Help at Discharge: Family;Available PRN/intermittently (DIL) Type of Home: House Home Access: Stairs to enter Entrance Stairs-Rails: Right;Left Entrance Stairs-Number of Steps: 4   Home Layout: One level Home Equipment: Agricultural consultant (2 wheels);Cane - single point;Shower seat      Prior Function Prior Level of Function : Independent/Modified Independent             Mobility Comments: Mod-I with RW in home when pt uses RW per DIL since pt had CVA. ADLs Comments: DIL helps with medication mgmt, prepares meals, transportation     Extremity/Trunk Assessment   Upper Extremity Assessment Upper Extremity Assessment: Overall WFL for tasks assessed    Lower Extremity Assessment Lower Extremity Assessment: Generalized weakness       Communication   Communication Communication: Impaired Factors Affecting Communication: Difficulty expressing self    Cognition Arousal: Alert Behavior During Therapy: Flat affect, Restless                           PT - Cognition Comments: A&Ox1 to person only Following commands: Impaired Following commands impaired: Follows one step commands inconsistently, Follows one  step commands with increased time     Cueing Cueing Techniques: Verbal cues, Tactile cues, Gestural cues     General Comments General comments (skin integrity, edema, etc.): HR: 70's at rest and with mobility.    Exercises     Assessment/Plan    PT Assessment Patient needs continued PT services  PT Problem List Decreased strength;Decreased cognition;Decreased activity tolerance;Decreased balance;Decreased mobility;Decreased safety awareness       PT Treatment Interventions DME instruction;Balance training;Gait training;Neuromuscular re-education;Stair training;Functional mobility training;Patient/family education;Cognitive remediation;Therapeutic activities;Therapeutic exercise    PT Goals (Current goals can be found in the Care Plan section)  Acute Rehab PT Goals PT Goal Formulation: Patient unable to participate in goal setting Time For Goal Achievement: 10/03/23    Frequency Min 2X/week     Co-evaluation               AM-PAC PT 6 Clicks Mobility  Outcome Measure Help needed turning from your back to your side while in a flat bed without using bedrails?: A Little Help needed moving from lying on your back to sitting on the side of a flat bed without using bedrails?: A Little Help needed moving to and from a  bed to a chair (including a wheelchair)?: A Little Help needed standing up from a chair using your arms (e.g., wheelchair or bedside chair)?: A Little Help needed to walk in hospital room?: A Little Help needed climbing 3-5 steps with a railing? : A Lot 6 Click Score: 17    End of Session Equipment Utilized During Treatment: Gait belt Activity Tolerance: Patient tolerated treatment well Patient left: in chair;with call bell/phone within reach;with chair alarm set Nurse Communication: Mobility status PT Visit Diagnosis: Unsteadiness on feet (R26.81);Other abnormalities of gait and mobility (R26.89);Muscle weakness (generalized) (M62.81)    Time:  8858-8844 PT Time Calculation (min) (ACUTE ONLY): 14 min   Charges:   PT Evaluation $PT Eval Moderate Complexity: 1 Mod   PT General Charges $$ ACUTE PT VISIT: 1 Visit        Niclas Markell M. Fairly IV, PT, DPT Physical Therapist- [place]  [facility] 09/19/2023, 1:16 PM

## 2023-09-20 DIAGNOSIS — G9341 Metabolic encephalopathy: Secondary | ICD-10-CM | POA: Diagnosis not present

## 2023-09-20 LAB — GASTROINTESTINAL PANEL BY PCR, STOOL (REPLACES STOOL CULTURE)

## 2023-09-20 LAB — CBC WITH DIFFERENTIAL/PLATELET
Abs Immature Granulocytes: 0.02 K/uL (ref 0.00–0.07)
Basophils Absolute: 0 K/uL (ref 0.0–0.1)
Basophils Relative: 0 %
Eosinophils Absolute: 0.2 K/uL (ref 0.0–0.5)
Eosinophils Relative: 3 %
HCT: 43.3 % (ref 39.0–52.0)
Hemoglobin: 14.1 g/dL (ref 13.0–17.0)
Immature Granulocytes: 0 %
Lymphocytes Relative: 21 %
Lymphs Abs: 1.1 K/uL (ref 0.7–4.0)
MCH: 30.9 pg (ref 26.0–34.0)
MCHC: 32.6 g/dL (ref 30.0–36.0)
MCV: 94.7 fL (ref 80.0–100.0)
Monocytes Absolute: 0.7 K/uL (ref 0.1–1.0)
Monocytes Relative: 13 %
Neutro Abs: 3.2 K/uL (ref 1.7–7.7)
Neutrophils Relative %: 63 %
Platelets: 159 K/uL (ref 150–400)
RBC: 4.57 MIL/uL (ref 4.22–5.81)
RDW: 16.1 % — ABNORMAL HIGH (ref 11.5–15.5)
WBC: 5.2 K/uL (ref 4.0–10.5)
nRBC: 0 % (ref 0.0–0.2)

## 2023-09-20 LAB — URINE CULTURE: Culture: NO GROWTH

## 2023-09-20 LAB — CBC
HCT: 44.2 % (ref 39.0–52.0)
Hemoglobin: 14 g/dL (ref 13.0–17.0)
MCH: 30.5 pg (ref 26.0–34.0)
MCHC: 31.7 g/dL (ref 30.0–36.0)
MCV: 96.3 fL (ref 80.0–100.0)
Platelets: 160 K/uL (ref 150–400)
RBC: 4.59 MIL/uL (ref 4.22–5.81)
RDW: 16.1 % — ABNORMAL HIGH (ref 11.5–15.5)
WBC: 5.6 K/uL (ref 4.0–10.5)
nRBC: 0 % (ref 0.0–0.2)

## 2023-09-20 LAB — BASIC METABOLIC PANEL WITH GFR
Anion gap: 10 (ref 5–15)
BUN: 38 mg/dL — ABNORMAL HIGH (ref 8–23)
CO2: 24 mmol/L (ref 22–32)
Calcium: 9 mg/dL (ref 8.9–10.3)
Chloride: 104 mmol/L (ref 98–111)
Creatinine, Ser: 1.95 mg/dL — ABNORMAL HIGH (ref 0.61–1.24)
GFR, Estimated: 32 mL/min — ABNORMAL LOW (ref >60)
Glucose, Bld: 145 mg/dL — ABNORMAL HIGH (ref 70–99)
Potassium: 4.1 mmol/L (ref 3.5–5.1)
Sodium: 138 mmol/L (ref 135–145)

## 2023-09-20 LAB — GLUCOSE, CAPILLARY
Glucose-Capillary: 105 mg/dL — ABNORMAL HIGH (ref 70–99)
Glucose-Capillary: 137 mg/dL — ABNORMAL HIGH (ref 70–99)
Glucose-Capillary: 140 mg/dL — ABNORMAL HIGH (ref 70–99)
Glucose-Capillary: 87 mg/dL (ref 70–99)
Glucose-Capillary: 93 mg/dL (ref 70–99)
Glucose-Capillary: 133 mg/dL — ABNORMAL HIGH (ref 70–99)

## 2023-09-20 LAB — C DIFFICILE QUICK SCREEN W PCR REFLEX
C Diff antigen: NEGATIVE
C Diff interpretation: NOT DETECTED
C Diff toxin: NEGATIVE

## 2023-09-20 LAB — TYPE AND SCREEN
ABO/RH(D): O POS
Antibody Screen: NEGATIVE

## 2023-09-20 LAB — HEMOGLOBIN AND HEMATOCRIT, BLOOD
HCT: 46.5 % (ref 39.0–52.0)
Hemoglobin: 14.9 g/dL (ref 13.0–17.0)

## 2023-09-20 LAB — HEPARIN LEVEL (UNFRACTIONATED): Heparin Unfractionated: 0.25 [IU]/mL — ABNORMAL LOW (ref 0.30–0.70)

## 2023-09-20 LAB — APTT: aPTT: 24 s (ref 24–36)

## 2023-09-20 LAB — CORTISOL-AM, BLOOD: Cortisol - AM: 12.6 ug/dL (ref 6.7–22.6)

## 2023-09-20 MED ORDER — SODIUM CHLORIDE 0.9 % IV SOLN: INTRAVENOUS | Status: AC

## 2023-09-20 NOTE — Care Management Important Message (Signed)
 Important Message  Patient Details  Name: [name] MRN: [id number] Date of Birth: Apr 03, 1933   Important Message Given:  Yes - Medicare IM     Rojelio SHAUNNA Rattler 09/20/2023, 2:24 PM

## 2023-09-20 NOTE — Progress Notes (Signed)
 Progress Note   Patient: [name] FMW:[id number] DOB: [birth year] DOA: 09/18/2023     2 DOS: the patient was seen and examined on 09/20/2023   Brief hospital course:  [name] is a 89 y.o. male with medical history significant of hypertension, hyperlipidemia, T2DM, permanent atrial fibrillation on edoxaban , SSS s/p PPM, HFrEF with EF of 45 to 50% -06/24/2023), V-Tach on amiodarone , stage IVb metastatic prostate cancer on Xtandi  and leuprolide  who presents to the emergency department from home via EMS due to 2-week onset of progressive worsening confusion.  Patient was unable to provide history, history was obtained from EDP, ED medical record and daughter-in-law at bedside.  Per report, patient lives at home with wife who has dementia and he was able to take care of his ADLs and care for his wife.  However, since past 2-3 weeks, family checked on him from time to time noted changes in cognition and several episodes of diarrhea.  It was also noted that he has not been taking his medications including amiodarone  as prescribed (he was taking more pills than he was supposed to), assumptions made based on number of pills in his pillbox.  His occupational therapist followed up with him today and noted he was covered in stool and was more confused, so he requested for him to be taken to the ED for further evaluation and management.   Patient was admitted from 6/2 to 6/6 due to monomorphic V. tach during which he underwent t LHC with Dr. Ammon (06/03) and revealed 40% stenosed Ost/prox LAD, mild LV systolic dysfunction.  He was also admitted from 6/7 to 6/20 due to left MCA infarct with left M1 occlusion s/p mechanical thrombectomy with TICI3 revascularization, etiology likely A-fib on DOAC in the setting of recent cardioversion.     Assessment and Plan:  Principal Problem:   Acute metabolic encephalopathy Active Problems:   Acute kidney injury superimposed on stage 4 chronic kidney  disease (HCC)   Monomorphic ventricular tachycardia (HCC)   Essential hypertension   OSA (obstructive sleep apnea)   Elevated troponin   Type 2 diabetes mellitus with hyperglycemia (HCC)   COPD (chronic obstructive pulmonary disease) (HCC)   Prostate cancer (HCC)   (HFpEF) heart failure with preserved ejection fraction (HCC)   S/P placement of leadless cardiac pacemaker   Prolonged QT interval   Elevated brain natriuretic peptide (BNP) level   Hypoalbuminemia due to protein-calorie malnutrition (HCC)   Acute on chronic combined systolic and diastolic CHF (congestive heart failure) (HCC)   Permanent atrial fibrillation with RVR (HCC)   History of ischemic stroke   History of DVT (deep vein thrombosis)   History of GI bleed   # Acute encephalopathy Likely multifactorial, recent stroke biggest contributor.  Work up here unrevealing except for AKI.  MRI nothing acute, no anemia, no electrolyte abnormalities, normal tsh, normal LFTs Continues to have diarrhea and stool is positive for norovirus Continue supportive care     # Hematochezia/epistaxis Patient had several episodes of bloody stools, bright red Episode of epistaxis Does have history GI bleed. Also has history of recent stroke when home edoxaban  was temporarily discontinued Started on heparin  drip which has since been held due to continued bloody stools and epistaxis Monitor H&H closely    # Debility Resides independently at home with wife who has dementia, will require at minimum significantly more support at home Appreciate PT input, recommend SNF on discharge    # AKI on ckd 3a Baseline cr around  1.4, today 1.95 with elevated BUN Secondary to GI losses from diarrhea IV fluid resuscitation Repeat renal parameters in a.m.   # Hx v-tach History of A-fib Has pacer Continue amiodarone  and metoprolol  for rate control   # T2DM Euglycemic Hold home farxiga    # PAD # Non-obstructive CAD Continue home  statin    # HFrEF Doesn't appear exacerbated Last known LVEF of 45 to 50%, 2D echocardiogram which was done 06/24 Continue to hold home farxiga , lasix  for now  Continue metoprolol       # Hx DVT - Heparin  on hold due to bleeding   # Metastatic prostate cancer Dr [name] ok with holding home xtandi  while hospitalized   # COPD Quiescent - incruse for home spiriva            Subjective: Sitting up in bed.  Appears comfortable  Physical Exam: Vitals:   09/19/23 1932 09/19/23 2342 09/20/23 0500 09/20/23 0731  BP: 126/78 125/83 121/72 129/81  Pulse: 69 70 70 72  Resp: 18 18 18 15   Temp: 98.3 F (36.8 C) 98.3 F (36.8 C) 97.6 F (36.4 C) (!) 97.5 F (36.4 C)  TempSrc:    Oral  SpO2: 100% 99%  95%  Weight:      Height:       General exam: Appears calm and comfortable  Respiratory system: Clear to auscultation. Respiratory effort normal. Cardiovascular system: S1 & S2 heard, RRR. Soft systolic murmur Gastrointestinal system: Abdomen is nondistended, soft and nontender.   Central nervous system: Alert and oriented to self, moving all 4 Extremities: Symmetric 5 x 5 power. Trace LE edema Skin: No rashes, lesions or ulcers Psychiatry: pleasantly confused      Data Reviewed: BUN 38, creatinine 1.95, Labs reviewed  Family Communication: None  Disposition: Status is: Inpatient Remains inpatient appropriate because: Monitor H&H due to rectal bleeding  Planned Discharge Destination: Skilled nursing facility    Time spent: 50 minutes  Author: Aimee Somerset, MD 09/20/2023 2:39 PM  For on call review [email or web address].

## 2023-09-21 DIAGNOSIS — G9341 Metabolic encephalopathy: Secondary | ICD-10-CM | POA: Diagnosis not present

## 2023-09-21 LAB — BASIC METABOLIC PANEL WITH GFR
Anion gap: 7 (ref 5–15)
BUN: 35 mg/dL — ABNORMAL HIGH (ref 8–23)
CO2: 24 mmol/L (ref 22–32)
Calcium: 9.1 mg/dL (ref 8.9–10.3)
Chloride: 108 mmol/L (ref 98–111)
Creatinine, Ser: 1.84 mg/dL — ABNORMAL HIGH (ref 0.61–1.24)
GFR, Estimated: 35 mL/min — ABNORMAL LOW (ref >60)
Glucose, Bld: 124 mg/dL — ABNORMAL HIGH (ref 70–99)
Potassium: 4.5 mmol/L (ref 3.5–5.1)
Sodium: 139 mmol/L (ref 135–145)

## 2023-09-21 LAB — GLUCOSE, CAPILLARY
Glucose-Capillary: 128 mg/dL — ABNORMAL HIGH (ref 70–99)
Glucose-Capillary: 170 mg/dL — ABNORMAL HIGH (ref 70–99)
Glucose-Capillary: 113 mg/dL — ABNORMAL HIGH (ref 70–99)
Glucose-Capillary: 161 mg/dL — ABNORMAL HIGH (ref 70–99)

## 2023-09-21 LAB — CBC
HCT: 44.4 % (ref 39.0–52.0)
Hemoglobin: 14.4 g/dL (ref 13.0–17.0)
MCH: 30.9 pg (ref 26.0–34.0)
MCHC: 32.4 g/dL (ref 30.0–36.0)
MCV: 95.3 fL (ref 80.0–100.0)
Platelets: 174 K/uL (ref 150–400)
RBC: 4.66 MIL/uL (ref 4.22–5.81)
RDW: 16.1 % — ABNORMAL HIGH (ref 11.5–15.5)
WBC: 5.5 K/uL (ref 4.0–10.5)
nRBC: 0 % (ref 0.0–0.2)

## 2023-09-21 MED ORDER — EDOXABAN TOSYLATE 30 MG PO TABS
30.0000 mg | ORAL_TABLET | Freq: Every day | ORAL | Status: DC
  Administered 2023-09-21: 30 mg via ORAL
  Filled 2023-09-21: qty 1

## 2023-09-21 NOTE — Plan of Care (Signed)

## 2023-09-21 NOTE — Plan of Care (Signed)

## 2023-09-21 NOTE — Progress Notes (Signed)
 Mobility Specialist - Progress Note     09/21/23 1523  Mobility  Activity Stood at bedside;Pivoted/transferred from bed to chair  Level of Assistance Minimal assist, patient does 75% or more  Assistive Device Front wheel walker  Distance Ambulated (ft) 3 ft  Range of Motion/Exercises Active  Activity Response Tolerated well  Mobility Referral Yes  Mobility visit 1 Mobility  Mobility Specialist Start Time (ACUTE ONLY) 1507  Mobility Specialist Stop Time (ACUTE ONLY) 1532  Mobility Specialist Time Calculation (min) (ACUTE ONLY) 25 min   Pt resting in bed on RA upon entry. Pt pleasantly confused but, agreeable to participate. Pt STS MinA and bloody stool observed (RN notified). Pt given heavy manual and verbal cuing due to patient confusion. Pt transferred to recliner with RW and left with needs in reach. Chair alarm activated.   Guido Rumble Mobility Specialist 09/21/23, 3:40 PM

## 2023-09-21 NOTE — Progress Notes (Signed)
 Progress Note   Patient: [name] FMW:[id number] DOB: [birth year] DOA: 09/18/2023     3 DOS: the patient was seen and examined on 09/21/2023   Brief hospital course:  [name] is a 89 y.o. male with medical history significant of hypertension, hyperlipidemia, T2DM, permanent atrial fibrillation on edoxaban , SSS s/p PPM, HFrEF with EF of 45 to 50% -06/24/2023), V-Tach on amiodarone , stage IVb metastatic prostate cancer on Xtandi  and leuprolide  who presents to the emergency department from home via EMS due to 2-week onset of progressive worsening confusion.  Patient was unable to provide history, history was obtained from EDP, ED medical record and daughter-in-law at bedside.  Per report, patient lives at home with wife who has dementia and he was able to take care of his ADLs and care for his wife.  However, since past 2-3 weeks, family checked on him from time to time noted changes in cognition and several episodes of diarrhea.  It was also noted that he has not been taking his medications including amiodarone  as prescribed (he was taking more pills than he was supposed to), assumptions made based on number of pills in his pillbox.  His occupational therapist followed up with him today and noted he was covered in stool and was more confused, so he requested for him to be taken to the ED for further evaluation and management.   Patient was admitted from 6/2 to 6/6 due to monomorphic V. tach during which he underwent t LHC with Dr. Ammon (06/03) and revealed 40% stenosed Ost/prox LAD, mild LV systolic dysfunction.  He was also admitted from 6/7 to 6/20 due to left MCA infarct with left M1 occlusion s/p mechanical thrombectomy with TICI3 revascularization, etiology likely A-fib on DOAC in the setting of recent cardioversion.     Assessment and Plan:   Principal Problem:   Acute metabolic encephalopathy Active Problems:   Acute kidney injury superimposed on stage 4 chronic kidney  disease (HCC)   Monomorphic ventricular tachycardia (HCC)   Essential hypertension   OSA (obstructive sleep apnea)   Elevated troponin   Type 2 diabetes mellitus with hyperglycemia (HCC)   COPD (chronic obstructive pulmonary disease) (HCC)   Prostate cancer (HCC)   (HFpEF) heart failure with preserved ejection fraction (HCC)   S/P placement of leadless cardiac pacemaker   Prolonged QT interval   Elevated brain natriuretic peptide (BNP) level   Hypoalbuminemia due to protein-calorie malnutrition (HCC)   Acute on chronic combined systolic and diastolic CHF (congestive heart failure) (HCC)   Permanent atrial fibrillation with RVR (HCC)   History of ischemic stroke   History of DVT (deep vein thrombosis)   History of GI bleed   # Acute encephalopathy Likely multifactorial, recent stroke biggest contributor.  Work up here unrevealing except for AKI.  MRI nothing acute, no anemia, no electrolyte abnormalities, normal tsh, normal LFTs Continues to have diarrhea and stool is positive for norovirus Continue supportive care       # Hematochezia/epistaxis No further episodes of bloody stools or epistaxis H&H is stable Will resume edoxaban  and monitor closely for bleeding Monitor H&H closely     # Debility Resides independently at home with wife who has dementia, will require at minimum significantly more support at home Appreciate PT input, recommend SNF on discharge     # AKI on ckd 3a Baseline cr around 1.4, serum creatinine is down to 1.8 with IV fluid hydration Secondary to GI losses from diarrhea Encourage oral fluid intake     #  Hx v-tach History of A-fib Has pacer Continue amiodarone  and metoprolol  for rate control   # T2DM Euglycemic Hold home farxiga    # PAD # Non-obstructive CAD Continue home statin     # HFrEF Doesn't appear exacerbated Last known LVEF of 45 to 50%, 2D echocardiogram which was done 06/24 Continue to hold home farxiga , lasix  for now   Continue metoprolol        # Hx DVT - Heparin  on hold due to bleeding   # Metastatic prostate cancer Dr Babara ok with holding home xtandi  while hospitalized   # COPD Quiescent - incruse for home spiriva            Subjective: No new complaints  Physical Exam: Vitals:   09/20/23 1701 09/20/23 2100 09/21/23 0515 09/21/23 0821  BP: 115/76 123/82 137/89 (!) 121/92  Pulse: 69 70 71 70  Resp: 17 16 16 16   Temp: 97.7 F (36.5 C) 98.3 F (36.8 C) 98.1 F (36.7 C) 98.3 F (36.8 C)  TempSrc: Oral Oral  Oral  SpO2: 100% 100% 94% 99%  Weight:      Height:       General exam: Appears calm and comfortable  Respiratory system: Clear to auscultation. Respiratory effort normal. Cardiovascular system: S1 & S2 heard, RRR. Soft systolic murmur Gastrointestinal system: Abdomen is nondistended, soft and nontender.   Central nervous system: Alert and oriented to self, moving all 4 Extremities: Symmetric 5 x 5 power. Trace LE edema Skin: No rashes, lesions or ulcers Psychiatry: pleasantly confused oriented only to person.       Data Reviewed: BUN 35, creatinine 1.84 Labs reviewed  Family Communication: None  Disposition: Status is: Inpatient Remains inpatient appropriate because: Awaiting placement  Planned Discharge Destination: Skilled nursing facility    Time spent: 35 minutes  Author: Aimee Somerset, MD 09/21/2023 11:53 AM  For on call review [email or web address].

## 2023-09-22 DIAGNOSIS — G9341 Metabolic encephalopathy: Secondary | ICD-10-CM | POA: Diagnosis not present

## 2023-09-22 LAB — GLUCOSE, CAPILLARY
Glucose-Capillary: 147 mg/dL — ABNORMAL HIGH (ref 70–99)
Glucose-Capillary: 139 mg/dL — ABNORMAL HIGH (ref 70–99)
Glucose-Capillary: 108 mg/dL — ABNORMAL HIGH (ref 70–99)
Glucose-Capillary: 137 mg/dL — ABNORMAL HIGH (ref 70–99)

## 2023-09-22 LAB — CBC
HCT: 41.7 % (ref 39.0–52.0)
Hemoglobin: 13.5 g/dL (ref 13.0–17.0)
MCH: 30.8 pg (ref 26.0–34.0)
MCHC: 32.4 g/dL (ref 30.0–36.0)
MCV: 95 fL (ref 80.0–100.0)
Platelets: 188 K/uL (ref 150–400)
RBC: 4.39 MIL/uL (ref 4.22–5.81)
RDW: 16 % — ABNORMAL HIGH (ref 11.5–15.5)
WBC: 5.6 K/uL (ref 4.0–10.5)
nRBC: 0 % (ref 0.0–0.2)

## 2023-09-22 LAB — BASIC METABOLIC PANEL WITH GFR
Anion gap: 10 (ref 5–15)
Anion gap: 8 (ref 5–15)
BUN: 38 mg/dL — ABNORMAL HIGH (ref 8–23)
BUN: 41 mg/dL — ABNORMAL HIGH (ref 8–23)
CO2: 23 mmol/L (ref 22–32)
CO2: 26 mmol/L (ref 22–32)
Calcium: 8.9 mg/dL (ref 8.9–10.3)
Calcium: 9.1 mg/dL (ref 8.9–10.3)
Chloride: 104 mmol/L (ref 98–111)
Chloride: 102 mmol/L (ref 98–111)
Creatinine, Ser: 1.99 mg/dL — ABNORMAL HIGH (ref 0.61–1.24)
Creatinine, Ser: 2.09 mg/dL — ABNORMAL HIGH (ref 0.61–1.24)
GFR, Estimated: 32 mL/min — ABNORMAL LOW (ref >60)
GFR, Estimated: 30 mL/min — ABNORMAL LOW (ref >60)
Glucose, Bld: 246 mg/dL — ABNORMAL HIGH (ref 70–99)
Glucose, Bld: 129 mg/dL — ABNORMAL HIGH (ref 70–99)
Potassium: 4.4 mmol/L (ref 3.5–5.1)
Potassium: 4.7 mmol/L (ref 3.5–5.1)
Sodium: 137 mmol/L (ref 135–145)
Sodium: 136 mmol/L (ref 135–145)

## 2023-09-22 LAB — MAGNESIUM: Magnesium: 2.1 mg/dL (ref 1.7–2.4)

## 2023-09-22 MED ORDER — POLYETHYLENE GLYCOL 3350 17 GM/SCOOP PO POWD
119.0000 g | Freq: Once | ORAL | Status: AC
  Administered 2023-09-22: 119 g via ORAL
  Filled 2023-09-22: qty 119

## 2023-09-22 NOTE — [place] FL2 (Signed)
 [place]  MEDICAID FL2 LEVEL OF CARE FORM     IDENTIFICATION  Patient Name: [name] Birthdate: [birth year] Sex: male Admission Date (Current Location): 09/18/2023  [facility] and [place] Number:  Chiropodist and Address:  [facility], [address] [place], [place] [address]      Provider Number: [id number]  Attending Physician Name and Address:  Lanetta Lingo, MD  Relative Name and Phone Number:       Current Level of Care: Hospital Recommended Level of Care: Skilled Nursing Facility Prior Approval Number:    Date Approved/Denied:   PASRR Number: [id number]  Discharge Plan: SNF    Current Diagnoses: Patient Active Problem List   Diagnosis Date Noted   History of ischemic stroke 09/19/2023   History of DVT (deep vein thrombosis) 09/19/2023   History of GI bleed 09/19/2023   Acute metabolic encephalopathy 09/18/2023   Prolonged QT interval 09/18/2023   Elevated brain natriuretic peptide (BNP) level 09/18/2023   Hypoalbuminemia due to protein-calorie malnutrition (HCC) 09/18/2023   Acute on chronic combined systolic and diastolic CHF (congestive heart failure) (HCC) 09/18/2023   Permanent atrial fibrillation with RVR (HCC) 09/18/2023   Dysphagia as late effect of cerebrovascular accident (CVA) 07/11/2023   Middle cerebral artery embolism, left 06/28/2023   Altered mental status 06/27/2023   Monomorphic ventricular tachycardia (HCC) 06/23/2023   Hypothermia 06/23/2023   Acute respiratory failure with hypoxia (HCC) 09/21/2022   (HFpEF) heart failure with preserved ejection fraction (HCC) 09/21/2022   Cellulitis of right upper extremity 09/10/2022   Metabolic acidosis 09/09/2022   Thrombocytopenia (HCC) 09/09/2022   S/P placement of leadless cardiac pacemaker 08/14/2022   Rectal bleeding 07/23/2022   Food bolus obstruction of intestine (HCC) 03/19/2022   Paroxysmal atrial fibrillation (HCC) 10/19/2021   Obesity (BMI  30-39.9) 10/05/2021   NSVT (nonsustained ventricular tachycardia) (HCC) 10/05/2021   Acute kidney injury superimposed on stage 4 chronic kidney disease (HCC) 10/04/2021   Acute deep vein thrombosis (DVT) of femoral vein of left lower extremity (HCC) 01/08/2021   Critical limb ischemia of left lower extremity (HCC) 12/25/2020   CKD (chronic kidney disease) stage 4, GFR 15-29 ml/min (HCC) 12/25/2020   Type II diabetes mellitus with renal manifestations (HCC) 12/25/2020   Stroke (HCC) 12/25/2020   Sick sinus syndrome (HCC) 12/19/2020   Retroperitoneal lymphadenopathy 04/26/2020   Other specified disorders of kidney and ureter 01/02/2020   Abdominal distension 10/31/2019   Atherosclerosis of aorta (HCC) 10/15/2019   Encounter for monitoring androgen deprivation therapy 10/13/2019   Other constipation 08/29/2019   Acute ischemic left MCA stroke (HCC) 08/27/2019   Normocytic anemia 08/05/2019   Prostate cancer (HCC) 08/05/2019   Goals of care, counseling/discussion 07/22/2019   Fecal impaction (HCC) 07/11/2019   Lower GI bleeding 06/30/2019   Diverticulosis of colon 02/07/2019   COPD (chronic obstructive pulmonary disease) (HCC)    Chronic a-fib (HCC)    Type 2 diabetes mellitus with hyperglycemia (HCC) 10/23/2018   NSTEMI (non-ST elevated myocardial infarction) (HCC) 10/23/2018   Need for vaccination against Streptococcus pneumoniae using pneumococcal conjugate vaccine 13 10/23/2018   Chronic mesenteric ischemia (HCC) 07/28/2018   Abdominal aortic aneurysm without rupture (HCC) 07/28/2018   Primary osteoarthritis of left hip 02/12/2018   Bilateral hearing loss due to cerumen impaction 10/06/2017   Chronic systolic heart failure (HCC) 08/19/2017   Atrial fibrillation with RVR (HCC) 08/02/2017   Acute on chronic heart failure with preserved ejection fraction (HFpEF) (HCC) 08/02/2017   Dysuria 07/20/2017  Hyperpigmentation 04/10/2017   Uncontrolled type 2 diabetes mellitus with  hyperglycemia (HCC) 03/24/2017   Encounter for general adult medical examination with abnormal findings 03/24/2017   Chest pain 05/04/2016   Elevated troponin 05/04/2016   Essential hypertension 07/06/2014   Cough 03/21/2014   OSA (obstructive sleep apnea) 03/21/2014   Hx of adenomatous colonic polyps 06/28/2013   Liver cyst 06/28/2013   CKD (chronic kidney disease), stage IV (HCC) 06/08/2013   Diabetes mellitus (HCC) 06/08/2013   Hyperlipidemia associated with type 2 diabetes mellitus (HCC) 06/08/2013   Valvular heart disease 06/08/2013    Orientation RESPIRATION BLADDER Height & Weight     Self  Normal Incontinent Weight: 207 lb 3.7 oz (94 kg) Height:  6' 1 (185.4 cm)  BEHAVIORAL SYMPTOMS/MOOD NEUROLOGICAL BOWEL NUTRITION STATUS      Incontinent Diet (Heart Healthy)  AMBULATORY STATUS COMMUNICATION OF NEEDS Skin   Limited Assist Verbally Normal                       Personal Care Assistance Level of Assistance  Bathing, Feeding, Dressing Bathing Assistance: Limited assistance Feeding assistance: Independent Dressing Assistance: Limited assistance     Functional Limitations Info             SPECIAL CARE FACTORS FREQUENCY  PT (By licensed PT), OT (By licensed OT)     PT Frequency: 5x/week OT Frequency: 5x/week            Contractures      Additional Factors Info  Code Status, Allergies Code Status Info: Full Allergies Info: NKA           Current Medications (09/22/2023):  This is the current hospital active medication list Current Facility-Administered Medications  Medication Dose Route Frequency Provider Last Rate Last Admin   acetaminophen  (TYLENOL ) tablet 650 mg  650 mg Oral Q6H PRN Adefeso, Oladapo, DO       Or   acetaminophen  (TYLENOL ) suppository 650 mg  650 mg Rectal Q6H PRN Adefeso, Oladapo, DO       amiodarone  (PACERONE ) tablet 200 mg  200 mg Oral Daily Kandis Devaughn Sayres, MD   200 mg at 09/22/23 9061   atorvastatin  (LIPITOR) tablet 10  mg  10 mg Oral Daily Adefeso, Oladapo, DO   10 mg at 09/22/23 9061   feeding supplement (GLUCERNA SHAKE) (GLUCERNA SHAKE) liquid 237 mL  237 mL Oral TID BM Adefeso, Oladapo, DO   237 mL at 09/21/23 2146   insulin  aspart (novoLOG ) injection 0-9 Units  0-9 Units Subcutaneous TID WC Adefeso, Oladapo, DO   1 Units at 09/22/23 9061   metoprolol  tartrate (LOPRESSOR ) tablet 37.5 mg  37.5 mg Oral BID Kandis Devaughn Sayres, MD   37.5 mg at 09/21/23 2146   pantoprazole  (PROTONIX ) EC tablet 40 mg  40 mg Oral Daily Adefeso, Oladapo, DO   40 mg at 09/22/23 9061   prochlorperazine  (COMPAZINE ) injection 10 mg  10 mg Intravenous Q6H PRN Adefeso, Oladapo, DO       thiamine  (VITAMIN B1) 500 mg in sodium chloride  0.9 % 50 mL IVPB  500 mg Intravenous TID Kandis Devaughn Sayres, MD 110 mL/hr at 09/22/23 0941 500 mg at 09/22/23 0941   umeclidinium bromide  (INCRUSE ELLIPTA ) 62.5 MCG/ACT 1 puff  1 puff Inhalation Daily Perley Dixon H, RPH   1 puff at 09/22/23 0942   verapamil  (CALAN -SR) CR tablet 120 mg  120 mg Oral QHS Adefeso, Oladapo, DO   120 mg at 09/21/23 2152  Discharge Medications: Please see discharge summary for a list of discharge medications.  Relevant Imaging Results:  Relevant Lab Results:   Additional Information SSN: [id number]  Roldan Laforest  [place], LCSW

## 2023-09-22 NOTE — Plan of Care (Signed)
  Problem: Clinical Measurements: Goal: Diagnostic test results will improve Outcome: Progressing   Problem: Nutrition: Goal: Adequate nutrition will be maintained Outcome: Progressing   Problem: Coping: Goal: Level of anxiety will decrease Outcome: Progressing   Problem: Pain Managment: Goal: General experience of comfort will improve and/or be controlled Outcome: Progressing

## 2023-09-22 NOTE — Progress Notes (Signed)
 Progress Note   Patient: [name] DOB: 26-Aug-1933 DOA: 09/18/2023     4 DOS: the patient was seen and examined on 09/22/2023   Brief hospital course:  [name] is a 89 y.o. male with medical history significant of hypertension, hyperlipidemia, T2DM, permanent atrial fibrillation on edoxaban , SSS s/p PPM, HFrEF with EF of 45 to 50% -06/24/2023), V-Tach on amiodarone , stage IVb metastatic prostate cancer on Xtandi  and leuprolide  who presents to the emergency department from home via EMS due to 2-week onset of progressive worsening confusion.  Patient was unable to provide history, history was obtained from EDP, ED medical record and daughter-in-law at bedside.  Per report, patient lives at home with wife who has dementia and he was able to take care of his ADLs and care for his wife.  However, since past 2-3 weeks, family checked on him from time to time noted changes in cognition and several episodes of diarrhea.  It was also noted that he has not been taking his medications including amiodarone  as prescribed (he was taking more pills than he was supposed to), assumptions made based on number of pills in his pillbox.  His occupational therapist followed up with him today and noted he was covered in stool and was more confused, so he requested for him to be taken to the ED for further evaluation and management.   Patient was admitted from 6/2 to 6/6 due to monomorphic V. tach during which he underwent t LHC with Dr. Ammon (06/03) and revealed 40% stenosed Ost/prox LAD, mild LV systolic dysfunction.  He was also admitted from 6/7 to 6/20 due to left MCA infarct with left M1 occlusion s/p mechanical thrombectomy with TICI3 revascularization, etiology likely A-fib on DOAC in the setting of recent cardioversion.      Assessment and Plan:  Principal Problem:   Acute metabolic encephalopathy Active Problems:   Acute kidney injury superimposed on stage 4 chronic kidney  disease (HCC)   Monomorphic ventricular tachycardia (HCC)   Essential hypertension   OSA (obstructive sleep apnea)   Elevated troponin   Type 2 diabetes mellitus with hyperglycemia (HCC)   COPD (chronic obstructive pulmonary disease) (HCC)   Prostate cancer (HCC)   (HFpEF) heart failure with preserved ejection fraction (HCC)   S/P placement of leadless cardiac pacemaker   Prolonged QT interval   Elevated brain natriuretic peptide (BNP) level   Hypoalbuminemia due to protein-calorie malnutrition (HCC)   Acute on chronic combined systolic and diastolic CHF (congestive heart failure) (HCC)   Permanent atrial fibrillation with RVR (HCC)   History of ischemic stroke   History of DVT (deep vein thrombosis)   History of GI bleed   # Acute encephalopathy Likely multifactorial, recent stroke biggest contributor.  Work up here unrevealing except for AKI.  MRI nothing acute, no anemia, no electrolyte abnormalities, normal tsh, normal LFTs Continues to have diarrhea and stool is positive for norovirus Continue supportive care       # Hematochezia/epistaxis Had another episode of rectal bleeding Noted to have a mild drop in his H&H from 14.4 >> 13.5 Edoxaban  is currently on hold GI consult for evaluation of recurrent rectal bleeding Monitor H&H closely     # Debility Resides independently at home with wife who has dementia, will require at minimum significantly more support at home Appreciate PT input, recommend SNF on discharge     # AKI on ckd 3a Baseline cr around 1.4, serum creatinine is down to 1.8 with  IV fluid hydration Secondary to GI losses from diarrhea Encourage oral fluid intake     # Hx v-tach History of A-fib Has pacer Continue amiodarone  and metoprolol  for rate control   # T2DM Euglycemic Hold home farxiga    # PAD # Non-obstructive CAD Continue home statin     # HFrEF Doesn't appear exacerbated Last known LVEF of 45 to 50%, 2D echocardiogram which was  done 06/24 Continue to hold home farxiga , lasix  for now  Continue metoprolol        # Hx DVT Anticoagulant on hold due to bleeding   # Metastatic prostate cancer Dr Babara ok with holding home xtandi  while hospitalized   # COPD Quiescent - incruse for home spiriva            Subjective: No further bleeding episodes per nursing.  Physical Exam: Vitals:   09/21/23 0821 09/21/23 2123 09/22/23 0429 09/22/23 0741  BP: (!) 121/92 (!) 130/92 116/80 (!) 96/57  Pulse: 70 70 (!) 101 72  Resp: 16 20 15 17   Temp: 98.3 F (36.8 C) 98.6 F (37 C) 97.6 F (36.4 C) (!) 97.5 F (36.4 C)  TempSrc: Oral  Oral Oral  SpO2: 99% 95% 99% 94%  Weight:      Height:       General exam: Appears calm and comfortable  Respiratory system: Clear to auscultation. Respiratory effort normal. Cardiovascular system: S1 & S2 heard, RRR. Soft systolic murmur Gastrointestinal system: Abdomen is nondistended, soft and nontender.   Central nervous system: Alert and oriented to self, moving all 4 Extremities: Symmetric 5 x 5 power. Trace LE edema Skin: No rashes, lesions or ulcers Psychiatry: pleasantly confused oriented only to person.      Data Reviewed: Creatinine 1.9, BUN 38 Labs reviewed  Family Communication: Patient's condition and plan of care discussed with his daughter-in-law and healthcare power of attorney, [name] over the phone.  She verbalizes understanding and agrees with the plan.  All questions and concerns have been addressed.  Disposition: Status is: Inpatient Remains inpatient appropriate because: Needs GI evaluation for recurrent rectal bleeding  Planned Discharge Destination: TBD    Time spent: 50 minutes  Author: Aimee Somerset, MD 09/22/2023 1:18 PM  For on call review [email or web address].

## 2023-09-22 NOTE — Consult Note (Signed)
 [name], MD [address]  [place], [place] [address]  Main: [phone] Fax:  [phone] Pager: [phone]   Consultation  Referring Provider:     Dennise Capri, MD Primary Care Physician:  Liana Fish, NP Primary Gastroenterologist: Sampson         Reason for Consultation: Rectal bleeding  Date of Admission:  09/18/2023 Date of Consultation:  09/22/2023         HPI:   Glenn MCFERRAN is a 89 y.o. male with medical history significant of hypertension, hyperlipidemia, T2DM, permanent atrial fibrillation on edoxaban , SSS s/p PPM, HFrEF with EF of 45 to 50% -06/24/2023), V-Tach on amiodarone , stage IVb metastatic prostate cancer on Xtandi  and leuprolide  admitted with altered mental status, AKI and diarrhea.  Stool studies were positive for norovirus.  Patient was admitted from 6/2 to 6/6 due to monomorphic V. tach during which he underwent t LHC with Dr. Ammon (06/03) and revealed 40% stenosed Ost/prox LAD, mild LV systolic dysfunction.  He was also admitted from 6/7 to 6/20 due to left MCA infarct with left M1 occlusion s/p mechanical thrombectomy with TICI3 revascularization, etiology likely A-fib on DOAC in the setting of recent cardioversion.  Patient is noted to have rectal bleeding on Eliquis  with mild drop in hemoglobin.  Therefore, Eliquis  has been held and GI is consulted for further evaluation Patient underwent colonoscopy for rectal bleeding in 06/2019, found to have rectal ulcer, treated with APC and hemostatic spray.  Biopsies were not performed at that time due to actively bleeding ulcer  NSAIDs: None  Antiplts/Anticoagulants/Anti thrombotics: Edoxaban   GI Procedures:  Upper endoscopy for dysphagia, 03/19/2022 - Normal esophagus. - Normal stomach. - Normal examined duodenum. - No specimens collected.  Colonoscopy for rectal bleeding 07/02/2019 - Bright red blood found on digital rectal exam. - Mucosal ulceration bleeding. Injected. hemostatic  spray applied. - Unclear etilogy of ulcer - stercoral ulcer or ischemic proctitis - The entire examined colon is normal. - No specimens collected.  Past Medical History:  Diagnosis Date   Acute deep vein thrombosis (DVT) of femoral vein of left lower extremity (HCC)    Atrial fibrillation (HCC)    CHF (congestive heart failure) (HCC)    Chronic kidney disease    Diabetes (HCC)    Hearing loss    Hyperlipidemia    Liver cyst    Nephrolithiasis    OSA (obstructive sleep apnea)    Prostate cancer (HCC)    SSS (sick sinus syndrome) (HCC)    Stroke (HCC)    Valvular heart disease     Past Surgical History:  Procedure Laterality Date   CATARACT EXTRACTION     COLONOSCOPY WITH PROPOFOL  N/A 07/02/2019   Procedure: COLONOSCOPY WITH PROPOFOL ;  Surgeon: Brooklyn Corinn Skiff, MD;  Location: [facility] ENDOSCOPY;  Service: Gastroenterology;  Laterality: N/A;   ESOPHAGOGASTRODUODENOSCOPY (EGD) WITH PROPOFOL  N/A 03/19/2022   Procedure: ESOPHAGOGASTRODUODENOSCOPY (EGD) WITH PROPOFOL ;  Surgeon: Therisa Bi, MD;  Location: [facility] ENDOSCOPY;  Service: Gastroenterology;  Laterality: N/A;   IR CT HEAD LTD  08/27/2019   IR CT HEAD LTD  06/28/2023   IR PERCUTANEOUS ART THROMBECTOMY/INFUSION INTRACRANIAL INC DIAG ANGIO  08/27/2019       IR PERCUTANEOUS ART THROMBECTOMY/INFUSION INTRACRANIAL INC DIAG ANGIO  08/27/2019   IR PERCUTANEOUS ART THROMBECTOMY/INFUSION INTRACRANIAL INC DIAG ANGIO  06/28/2023   LEFT HEART CATH AND CORONARY ANGIOGRAPHY N/A 06/24/2023   Procedure: LEFT HEART CATH AND CORONARY ANGIOGRAPHY;  Surgeon: Ammon Blunt, MD;  Location: [facility] INVASIVE CV  LAB;  Service: Cardiovascular;  Laterality: N/A;   LOWER EXTREMITY ANGIOGRAPHY Left 12/25/2020   Procedure: Lower Extremity Angiography;  Surgeon: Marea Selinda RAMAN, MD;  Location: [facility] INVASIVE CV LAB;  Service: Cardiovascular;  Laterality: Left;   PACEMAKER LEADLESS INSERTION N/A 12/19/2020   Procedure: PACEMAKER LEADLESS INSERTION;  Surgeon: Ammon Blunt, MD;  Location: [facility] INVASIVE CV LAB;  Service: Cardiovascular;  Laterality: N/A;   PROSTATE CANCER     PROSTATE SURGERY     RADIOLOGY WITH ANESTHESIA N/A 08/27/2019   Procedure: IR WITH ANESTHESIA;  Surgeon: Dolphus Carrion, MD;  Location: [facility] OR;  Service: Radiology;  Laterality: N/A;   RADIOLOGY WITH ANESTHESIA N/A 06/28/2023   Procedure: RADIOLOGY WITH ANESTHESIA;  Surgeon: Radiologist, Medication, MD;  Location: [facility] OR;  Service: Radiology;  Laterality: N/A;     Current Facility-Administered Medications:    acetaminophen  (TYLENOL ) tablet 650 mg, 650 mg, Oral, Q6H PRN **OR** acetaminophen  (TYLENOL ) suppository 650 mg, 650 mg, Rectal, Q6H PRN, Adefeso, Oladapo, DO   amiodarone  (PACERONE ) tablet 200 mg, 200 mg, Oral, Daily, Wouk, Devaughn Sayres, MD, 200 mg at 09/22/23 9061   atorvastatin  (LIPITOR) tablet 10 mg, 10 mg, Oral, Daily, Adefeso, Oladapo, DO, 10 mg at 09/22/23 9061   feeding supplement (GLUCERNA SHAKE) (GLUCERNA SHAKE) liquid 237 mL, 237 mL, Oral, TID BM, Adefeso, Oladapo, DO, 237 mL at 09/21/23 2146   insulin  aspart (novoLOG ) injection 0-9 Units, 0-9 Units, Subcutaneous, TID WC, Adefeso, Oladapo, DO, 1 Units at 09/22/23 1244   metoprolol  tartrate (LOPRESSOR ) tablet 37.5 mg, 37.5 mg, Oral, BID, Wouk, Devaughn Sayres, MD, 37.5 mg at 09/21/23 2146   pantoprazole  (PROTONIX ) EC tablet 40 mg, 40 mg, Oral, Daily, Adefeso, Oladapo, DO, 40 mg at 09/22/23 9061   polyethylene glycol powder (GLYCOLAX /MIRALAX ) container 119 g, 119 g, Oral, Once, Celina Shiley, Corinn Skiff, MD   prochlorperazine  (COMPAZINE ) injection 10 mg, 10 mg, Intravenous, Q6H PRN, Adefeso, Oladapo, DO   umeclidinium bromide  (INCRUSE ELLIPTA ) 62.5 MCG/ACT 1 puff, 1 puff, Inhalation, Daily, Hunt, Madison H, RPH, 1 puff at 09/22/23 9057   verapamil  (CALAN -SR) CR tablet 120 mg, 120 mg, Oral, QHS, Adefeso, Oladapo, DO, 120 mg at 09/21/23 2152   Family History  Problem Relation Age of Onset   Colon cancer Mother    Diabetes Other     High blood pressure Other    Prostate cancer Brother    Diabetes Brother      Social History   Tobacco Use   Smoking status: Former    Types: Cigarettes   Smokeless tobacco: Never  Vaping Use   Vaping status: Never Used  Substance Use Topics   Alcohol use: No   Drug use: No    Allergies as of 09/18/2023   (No Known Allergies)    Review of Systems:    All systems reviewed and negative except where noted in HPI.   Physical Exam:  Vital signs in last 24 hours: Temp:  [97.5 F (36.4 C)-98.6 F (37 C)] 97.5 F (36.4 C) (09/01 0741) Pulse Rate:  [70-101] 72 (09/01 0741) Resp:  [15-20] 17 (09/01 0741) BP: (96-130)/(57-92) 96/57 (09/01 0741) SpO2:  [94 %-99 %] 94 % (09/01 0741) Last BM Date : 09/22/23 General:   Alert,  Well-developed, well-nourished, pleasant and cooperative in NAD Eyes:  Sclera clear, no icterus.   Conjunctiva pink. Lungs:  Respirations even and unlabored.  Clear throughout to auscultation.   No wheezes, crackles, or rhonchi. No acute distress. Heart:  Regular rate and rhythm; no murmurs, clicks,  rubs, or gallops. Abdomen:  Normal bowel sounds. Soft, non-tender and non-distended without masses, hepatosplenomegaly or hernias noted.  No guarding or rebound tenderness.   Rectal: Not performed Extremities:  No clubbing or edema.  No cyanosis. Neurologic:  Alert and oriented x2 Skin:  Intact without significant lesions or rashes. No jaundice. Psych:  Alert and cooperative. Normal mood and affect.  LAB RESULTS:    Latest Ref Rng & Units 09/22/2023   10:17 AM 09/21/2023    6:22 AM 09/20/2023    6:15 PM  CBC  WBC 4.0 - 10.5 K/uL 5.6  5.5  5.6   Hemoglobin 13.0 - 17.0 g/dL 86.4  85.5  85.9   Hematocrit 39.0 - 52.0 % 41.7  44.4  44.2   Platelets 150 - 400 K/uL 188  174  160     BMET    Latest Ref Rng & Units 09/22/2023   10:17 AM 09/21/2023    6:22 AM 09/20/2023    9:22 AM  BMP  Glucose 70 - 99 mg/dL 753  875  854   BUN 8 - 23 mg/dL 38  35  38    Creatinine 0.61 - 1.24 mg/dL 8.00  8.15  8.04   Sodium 135 - 145 mmol/L 137  139  138   Potassium 3.5 - 5.1 mmol/L 4.4  4.5  4.1   Chloride 98 - 111 mmol/L 104  108  104   CO2 22 - 32 mmol/L 23  24  24    Calcium  8.9 - 10.3 mg/dL 8.9  9.1  9.0     LFT    Latest Ref Rng & Units 09/19/2023    3:52 AM 09/18/2023    3:54 PM 08/13/2023   12:43 PM  Hepatic Function  Total Protein 6.5 - 8.1 g/dL 6.8  7.5  7.9   Albumin  3.5 - 5.0 g/dL 2.9  3.3  3.8   AST 15 - 41 U/L 19  19  23    ALT 0 - 44 U/L 9  12  14    Alk Phosphatase 38 - 126 U/L 100  106  124   Total Bilirubin 0.0 - 1.2 mg/dL 0.6  0.8  0.8      STUDIES: No results found.    Impression / Plan:   [name] is a 89 y.o. male with history of A-fib on edoxaban , SSS s/p PPM, HFrEF with EF of 45 to 50% -06/24/2023), V-Tach on amiodarone , stage IVb metastatic prostate cancer on Xtandi  and leuprolide , past history of rectal ulcer based on colonoscopy in 2021 is admitted with altered mental status secondary to AKI in setting of norovirus enteritis.  Developed rectal bleeding on anticoagulation  Rectal bleeding, hemodynamically insignificant GI bleed Given history of rectal ulcer, recommend flexible sigmoidoscopy to assess the etiology of rectal bleeding Continue to hold edoxaban  Clear liquid diet today Have the MiraLAX  prep ordered N.p.o. effective 5 AM tomorrow Flexible sigmoidoscopy by Dr. Aundria tomorrow  Thank you for involving me in the care of this patient.  Dr. Aundria will cover from tomorrow    LOS: 4 days   Corinn Brooklyn, MD  09/22/2023, 3:49 PM    Note: This dictation was prepared with Dragon dictation along with smaller phrase technology. Any transcriptional errors that result from this process are unintentional.

## 2023-09-22 NOTE — TOC Initial Note (Signed)
 Transition of Care [facility]) - Initial/Assessment Note    Patient Details  Name: [name]: [id number] Date of Birth: December 12, 1933  Transition of Care [facility]) CM/SW Contact:    Alvaro Louder, LCSW Phone Number: 09/22/2023, 10:12 AM  Clinical Narrative:   ISRAEL Cowboy out information to SNF's in [place]. LCSWA will present Facilities to patient at the bedside.  TOC to follow for discharge                    Patient Goals and CMS Choice            Expected Discharge Plan and Services                                              Prior Living Arrangements/Services                       Activities of Daily Living   ADL Screening (condition at time of admission) Independently performs ADLs?: Yes (appropriate for developmental age) Is the patient deaf or have difficulty hearing?: Yes Does the patient have difficulty seeing, even when wearing glasses/contacts?: No Does the patient have difficulty concentrating, remembering, or making decisions?: Yes  Permission Sought/Granted                  Emotional Assessment              Admission diagnosis:  Acute renal insufficiency [N28.9] Acute encephalopathy [G93.40] Skin breakdown [R23.8] Non compliance w medication regimen [Z91.148] Acute metabolic encephalopathy [G93.41] Patient Active Problem List   Diagnosis Date Noted   History of ischemic stroke 09/19/2023   History of DVT (deep vein thrombosis) 09/19/2023   History of GI bleed 09/19/2023   Acute metabolic encephalopathy 09/18/2023   Prolonged QT interval 09/18/2023   Elevated brain natriuretic peptide (BNP) level 09/18/2023   Hypoalbuminemia due to protein-calorie malnutrition (HCC) 09/18/2023   Acute on chronic combined systolic and diastolic CHF (congestive heart failure) (HCC) 09/18/2023   Permanent atrial fibrillation with RVR (HCC) 09/18/2023   Dysphagia as late effect of cerebrovascular accident (CVA) 07/11/2023    Middle cerebral artery embolism, left 06/28/2023   Altered mental status 06/27/2023   Monomorphic ventricular tachycardia (HCC) 06/23/2023   Hypothermia 06/23/2023   Acute respiratory failure with hypoxia (HCC) 09/21/2022   (HFpEF) heart failure with preserved ejection fraction (HCC) 09/21/2022   Cellulitis of right upper extremity 09/10/2022   Metabolic acidosis 09/09/2022   Thrombocytopenia (HCC) 09/09/2022   S/P placement of leadless cardiac pacemaker 08/14/2022   Rectal bleeding 07/23/2022   Food bolus obstruction of intestine (HCC) 03/19/2022   Paroxysmal atrial fibrillation (HCC) 10/19/2021   Obesity (BMI 30-39.9) 10/05/2021   NSVT (nonsustained ventricular tachycardia) (HCC) 10/05/2021   Acute kidney injury superimposed on stage 4 chronic kidney disease (HCC) 10/04/2021   Acute deep vein thrombosis (DVT) of femoral vein of left lower extremity (HCC) 01/08/2021   Critical limb ischemia of left lower extremity (HCC) 12/25/2020   CKD (chronic kidney disease) stage 4, GFR 15-29 ml/min (HCC) 12/25/2020   Type II diabetes mellitus with renal manifestations (HCC) 12/25/2020   Stroke (HCC) 12/25/2020   Sick sinus syndrome (HCC) 12/19/2020   Retroperitoneal lymphadenopathy 04/26/2020   Other specified disorders of kidney and ureter 01/02/2020   Abdominal distension 10/31/2019   Atherosclerosis of aorta (HCC) 10/15/2019  Encounter for monitoring androgen deprivation therapy 10/13/2019   Other constipation 08/29/2019   Acute ischemic left MCA stroke (HCC) 08/27/2019   Normocytic anemia 08/05/2019   Prostate cancer (HCC) 08/05/2019   Goals of care, counseling/discussion 07/22/2019   Fecal impaction (HCC) 07/11/2019   Lower GI bleeding 06/30/2019   Diverticulosis of colon 02/07/2019   COPD (chronic obstructive pulmonary disease) (HCC)    Chronic a-fib (HCC)    Type 2 diabetes mellitus with hyperglycemia (HCC) 10/23/2018   NSTEMI (non-ST elevated myocardial infarction) (HCC)  10/23/2018   Need for vaccination against Streptococcus pneumoniae using pneumococcal conjugate vaccine 13 10/23/2018   Chronic mesenteric ischemia (HCC) 07/28/2018   Abdominal aortic aneurysm without rupture (HCC) 07/28/2018   Primary osteoarthritis of left hip 02/12/2018   Bilateral hearing loss due to cerumen impaction 10/06/2017   Chronic systolic heart failure (HCC) 08/19/2017   Atrial fibrillation with RVR (HCC) 08/02/2017   Acute on chronic heart failure with preserved ejection fraction (HFpEF) (HCC) 08/02/2017   Dysuria 07/20/2017   Hyperpigmentation 04/10/2017   Uncontrolled type 2 diabetes mellitus with hyperglycemia (HCC) 03/24/2017   Encounter for general adult medical examination with abnormal findings 03/24/2017   Chest pain 05/04/2016   Elevated troponin 05/04/2016   Essential hypertension 07/06/2014   Cough 03/21/2014   OSA (obstructive sleep apnea) 03/21/2014   Hx of adenomatous colonic polyps 06/28/2013   Liver cyst 06/28/2013   CKD (chronic kidney disease), stage IV (HCC) 06/08/2013   Diabetes mellitus (HCC) 06/08/2013   Hyperlipidemia associated with type 2 diabetes mellitus (HCC) 06/08/2013   Valvular heart disease 06/08/2013   PCP:  Liana Fish, NP Pharmacy:   [id number] - [place], [place] - [address] AT [facility] [address] [place] [address] Phone: [phone] Fax: [phone]  [place] [facility] [address] [place] [address] Phone: [phone] Fax: [phone]  [facility] [address] [place] [address] Phone: [phone] Fax: [phone]     Social Drivers of Health (SDOH) Social History: SDOH Screenings   Food Insecurity: No Food Insecurity (09/18/2023)  Housing: Low Risk  (09/18/2023)  Transportation Needs: No Transportation Needs (09/18/2023)  Utilities: Patient Unable To Answer (09/18/2023)   Alcohol Screen: Low Risk  (10/26/2021)  Depression (PHQ2-9): Low Risk  (08/13/2023)  Financial Resource Strain: Low Risk  (07/28/2023)  Physical Activity: Inactive (10/29/2019)  Social Connections: Patient Unable To Answer (09/18/2023)  Stress: No Stress Concern Present (10/29/2019)  Recent Concern: Stress - Stress Concern Present (09/08/2019)  Tobacco Use: Medium Risk (09/18/2023)  Health Literacy: Inadequate Health Literacy (07/28/2023)   SDOH Interventions:     Readmission Risk Interventions    07/01/2023    4:26 PM 09/11/2022    1:23 PM  Readmission Risk Prevention Plan  Transportation Screening Complete Complete  Medication Review (RN Care Manager) Complete Referral to Pharmacy  PCP or Specialist appointment within 3-5 days of discharge Complete Complete  HRI or Home Care Consult Complete Complete  SW Recovery Care/Counseling Consult Complete Complete  Palliative Care Screening Not Applicable Not Applicable  Skilled Nursing Facility Not Applicable Not Applicable

## 2023-09-23 ENCOUNTER — Inpatient Hospital Stay: Admitting: Certified Registered Nurse Anesthetist

## 2023-09-23 ENCOUNTER — Encounter
Admission: EM | Disposition: A | Discharge status: Skilled Nursing Facility | Payer: Self-pay | Source: Home / Self Care | Attending: Osteopathic Medicine

## 2023-09-23 DIAGNOSIS — G9341 Metabolic encephalopathy: Secondary | ICD-10-CM | POA: Diagnosis not present

## 2023-09-23 HISTORY — PX: FLEXIBLE SIGMOIDOSCOPY: SHX5431

## 2023-09-23 LAB — GLUCOSE, CAPILLARY
Glucose-Capillary: 143 mg/dL — ABNORMAL HIGH (ref 70–99)
Glucose-Capillary: 100 mg/dL — ABNORMAL HIGH (ref 70–99)
Glucose-Capillary: 181 mg/dL — ABNORMAL HIGH (ref 70–99)
Glucose-Capillary: 105 mg/dL — ABNORMAL HIGH (ref 70–99)

## 2023-09-23 SURGERY — SIGMOIDOSCOPY, FLEXIBLE
Anesthesia: General

## 2023-09-23 MED ORDER — POLYETHYLENE GLYCOL 3350 17 G PO PACK
17.0000 g | PACK | Freq: Every day | ORAL | Status: DC
  Administered 2023-09-24 – 2023-09-30 (×4): 17 g via ORAL
  Filled 2023-09-23 (×5): qty 1

## 2023-09-23 MED ORDER — EDOXABAN TOSYLATE 30 MG PO TABS
30.0000 mg | ORAL_TABLET | ORAL | Status: DC
  Administered 2023-09-24: 30 mg via ORAL
  Filled 2023-09-23: qty 1

## 2023-09-23 MED ORDER — EDOXABAN TOSYLATE 30 MG PO TABS
30.0000 mg | ORAL_TABLET | ORAL | Status: DC
  Filled 2023-09-23: qty 1

## 2023-09-23 MED ORDER — PHENYLEPHRINE 80 MCG/ML (10ML) SYRINGE FOR IV PUSH (FOR BLOOD PRESSURE SUPPORT)
PREFILLED_SYRINGE | INTRAVENOUS | Status: DC | PRN
  Administered 2023-09-23: 80 ug via INTRAVENOUS

## 2023-09-23 MED ORDER — EPHEDRINE SULFATE-NACL 50-0.9 MG/10ML-% IV SOSY
PREFILLED_SYRINGE | INTRAVENOUS | Status: DC | PRN
  Administered 2023-09-23: 5 mg via INTRAVENOUS

## 2023-09-23 MED ORDER — PROPOFOL 10 MG/ML IV BOLUS
INTRAVENOUS | Status: DC | PRN
  Administered 2023-09-23: 50 mg via INTRAVENOUS

## 2023-09-23 MED ORDER — PROPOFOL 500 MG/50ML IV EMUL
INTRAVENOUS | Status: DC | PRN
  Administered 2023-09-23: 140 ug/kg/min via INTRAVENOUS

## 2023-09-23 MED ORDER — SODIUM CHLORIDE 0.9 % IV SOLN: INTRAVENOUS | Status: AC

## 2023-09-23 NOTE — Anesthesia Postprocedure Evaluation (Signed)
 Anesthesia Post Note  Patient: [name]  Procedure(s) Performed: KINGSTON SIDE  Patient location during evaluation: Endoscopy Anesthesia Type: General Level of consciousness: awake and alert Pain management: pain level controlled Vital Signs Assessment: post-procedure vital signs reviewed and stable Respiratory status: spontaneous breathing, nonlabored ventilation and respiratory function stable Cardiovascular status: blood pressure returned to baseline and stable Postop Assessment: no apparent nausea or vomiting Anesthetic complications: no   No notable events documented.   Last Vitals:  Vitals:   09/23/23 1247 09/23/23 1257  BP:  (!) 99/55  Pulse: 75 74  Resp: 18 17  Temp:    SpO2: 95% 100%    Last Pain:  Vitals:   09/23/23 1257  TempSrc:   PainSc: 0-No pain                 Fairy POUR Quency Tober

## 2023-09-23 NOTE — Anesthesia Procedure Notes (Signed)
 Date/Time: 09/23/2023 12:30 PM  Performed by: Duwayne Craven, CRNAPre-anesthesia Checklist: Patient identified, Emergency Drugs available, Suction available, Patient being monitored and Timeout performed Patient Re-evaluated:Patient Re-evaluated prior to induction Oxygen  Delivery Method: Nasal cannula Induction Type: IV induction Placement Confirmation: CO2 detector and positive ETCO2

## 2023-09-23 NOTE — Plan of Care (Signed)
  Problem: Clinical Measurements: Goal: Diagnostic test results will improve Outcome: Progressing   Problem: Activity: Goal: Risk for activity intolerance will decrease Outcome: Progressing   Problem: Pain Managment: Goal: General experience of comfort will improve and/or be controlled Outcome: Progressing

## 2023-09-23 NOTE — Progress Notes (Signed)
 Progress Note   Patient: [name] FMW:[id number] DOB: 1933-08-22 DOA: 09/18/2023     5 DOS: the patient was seen and examined on 09/23/2023   Brief hospital course:  SOHAIB VEREEN is a 89 y.o. male with medical history significant of hypertension, hyperlipidemia, T2DM, permanent atrial fibrillation on edoxaban , SSS s/p PPM, HFrEF with EF of 45 to 50% -06/24/2023), V-Tach on amiodarone , stage IVb metastatic prostate cancer on Xtandi  and leuprolide  who presents to the emergency department from home via EMS due to 2-week onset of progressive worsening confusion.  Patient was unable to provide history, history was obtained from EDP, ED medical record and daughter-in-law at bedside.  Per report, patient lives at home with wife who has dementia and he was able to take care of his ADLs and care for his wife.  However, since past 2-3 weeks, family checked on him from time to time noted changes in cognition and several episodes of diarrhea.  It was also noted that he has not been taking his medications including amiodarone  as prescribed (he was taking more pills than he was supposed to), assumptions made based on number of pills in his pillbox.  His occupational therapist followed up with him today and noted he was covered in stool and was more confused, so he requested for him to be taken to the ED for further evaluation and management.   Patient was admitted from 6/2 to 6/6 due to monomorphic V. tach during which he underwent t LHC with Dr. Ammon (06/03) and revealed 40% stenosed Ost/prox LAD, mild LV systolic dysfunction.  He was also admitted from 6/7 to 6/20 due to left MCA infarct with left M1 occlusion s/p mechanical thrombectomy with TICI3 revascularization, etiology likely A-fib on DOAC in the setting of recent cardioversion.        Assessment and Plan:   Principal Problem:   Acute metabolic encephalopathy Active Problems:   Acute kidney injury superimposed on stage 4 chronic kidney  disease (HCC)   Monomorphic ventricular tachycardia (HCC)   Essential hypertension   OSA (obstructive sleep apnea)   Elevated troponin   Type 2 diabetes mellitus with hyperglycemia (HCC)   COPD (chronic obstructive pulmonary disease) (HCC)   Prostate cancer (HCC)   (HFpEF) heart failure with preserved ejection fraction (HCC)   S/P placement of leadless cardiac pacemaker   Prolonged QT interval   Elevated brain natriuretic peptide (BNP) level   Hypoalbuminemia due to protein-calorie malnutrition (HCC)   Acute on chronic combined systolic and diastolic CHF (congestive heart failure) (HCC)   Permanent atrial fibrillation with RVR (HCC)   History of ischemic stroke   History of DVT (deep vein thrombosis)   History of GI bleed   # Acute encephalopathy Likely multifactorial, recent stroke biggest contributor.  Work up here unrevealing except for AKI.  MRI nothing acute, no anemia, no electrolyte abnormalities, normal tsh, normal LFTs Continues to have diarrhea and stool is positive for norovirus Continue supportive care       # Hematochezia/epistaxis Recurrent episodes of rectal bleeding Noted to have a mild drop in his H&H from 14.4 >> 13.5 Edoxaban  is currently on hold Appreciate GI input.  Patient had a flexible sigmoidoscopy which showed a rectal ulcer Will discuss with GI when to resume long-term anticoagulation     # Debility Resides independently at home with wife who has dementia, will require at minimum significantly more support at home Appreciate PT input, recommend SNF on discharge     # AKI  on ckd 3a Baseline cr around 1.4, serum creatinine is down to 1.8 with IV fluid hydration Secondary to GI losses from diarrhea Encourage oral fluid intake     # Hx v-tach History of A-fib Has pacer Continue amiodarone  and metoprolol  for rate control    # T2DM Euglycemic Hold home farxiga    # PAD # Non-obstructive CAD Continue home statin     # HFrEF Doesn't  appear exacerbated Last known LVEF of 45 to 50%, 2D echocardiogram which was done 06/24 Continue to hold home farxiga , lasix  for now  Continue metoprolol        # Hx DVT Anticoagulant on hold due to bleeding   # Metastatic prostate cancer Dr Babara ok with holding home xtandi  while hospitalized    # COPD Quiescent - incruse for home spiriva            Subjective: Scheduled for flexible sigmoidoscopy today  Physical Exam: Vitals:   09/23/23 1237 09/23/23 1247 09/23/23 1257 09/23/23 1322  BP: 122/77  (!) 99/55 115/71  Pulse: 74 75 74 72  Resp: 18 18 17 16   Temp: (!) 96.1 F (35.6 C)   98.2 F (36.8 C)  TempSrc:      SpO2: 96% 95% 100% 100%  Weight:      Height:       General exam: Appears calm and comfortable  Respiratory system: Clear to auscultation. Respiratory effort normal. Cardiovascular system: S1 & S2 heard, RRR. Soft systolic murmur Gastrointestinal system: Abdomen is nondistended, soft and nontender.   Central nervous system: Alert and oriented to self, moving all 4 Extremities: Symmetric 5 x 5 power. Trace LE edema Skin: No rashes, lesions or ulcers Psychiatry: pleasantly confused oriented only to person.      Data Reviewed:  There are no new results to review at this time.  Family Communication: Discussed patient's condition and plan of care with his healthcare power of attorney over the phone.  All questions and concerns have been addressed.  She verbalizes understanding and agrees with the plan.  Disposition: Status is: Inpatient Remains inpatient appropriate because: Awaiting placement  Planned Discharge Destination: Skilled nursing facility    Time spent: 50 minutes  Author: Aimee Somerset, MD 09/23/2023 1:31 PM  For on call review [email or web address].

## 2023-09-23 NOTE — Progress Notes (Signed)
 Per report from night shift nurse patient only tolerated drinking half of prep. Endoscopy nurse notified. Two tap enemas ordered. One tap enema attempted, patient confused, orient to self only, and yelling cuss words. Patient clenching and bearing down when tap water enema attempted. Endoscopy nurse notified, told not to attempted second enema.

## 2023-09-23 NOTE — Progress Notes (Signed)
 Physical Therapy Treatment Patient Details Name: [name] MRN: [id number] DOB: 12-Oct-1933 Today's Date: 09/23/2023   History of Present Illness [name] is a 89 y.o. male with medical history significant of hypertension, hyperlipidemia, T2DM, permanent atrial fibrillation on edoxaban , SSS s/p PPM, HFrEF with EF of 45 to 50% -06/24/2023), V-Tach on amiodarone , stage IVb metastatic prostate cancer on Xtandi  and leuprolide  who presents to the emergency department from home via EMS due to 2-week onset of progressive worsening confusion.    PT Comments  Patient supine in bed on arrival and disgruntled at presence of PT. Reluctant to participate in any attempts at OOB, however able to get him OOB after found to be incontinent of bowel. Required minA to complete bed mobility and sit to stand at bedside with RW. TotalA for pericare and incontinent of urine in standing. Able to take sidesteps towards [facility] with RW and minA, however when asked to ambulate away from bed, patient sat down on bed. Discharge plan remains appropriate. Progress limited due to cognition at this time.     If plan is discharge home, recommend the following: A little help with walking and/or transfers;A little help with bathing/dressing/bathroom;Assistance with cooking/housework;Direct supervision/assist for financial management;Supervision due to cognitive status;Assist for transportation;Direct supervision/assist for medications management;Help with stairs or ramp for entrance   Can travel by private vehicle     Yes  Equipment Recommendations  None recommended by PT    Recommendations for Other Services       Precautions / Restrictions Precautions Precautions: Fall Recall of Precautions/Restrictions: Intact Restrictions Weight Bearing Restrictions Per Provider Order: No     Mobility  Bed Mobility Overal bed mobility: Needs Assistance Bed Mobility: Supine to Sit, Sit to Supine     Supine to sit: Min  assist, HOB elevated, Used rails Sit to supine: Min assist        Transfers Overall transfer level: Needs assistance Equipment used: Rolling Mattisyn Cardona (2 wheels) Transfers: Sit to/from Stand Sit to Stand: Min assist           General transfer comment: stood from EOB for pericare after bowel incontinence. Able to take sidesteps towards [facility] with RW. Disgruntled  and reluctant to perform any tasks asked of patient    Ambulation/Gait                   Stairs             Wheelchair Mobility     Tilt Bed    Modified Rankin (Stroke Patients Only)       Balance Overall balance assessment: Needs assistance Sitting-balance support: No upper extremity supported, Feet supported Sitting balance-Leahy Scale: Good     Standing balance support: Bilateral upper extremity supported Standing balance-Leahy Scale: Poor                              Communication Communication Communication: Impaired Factors Affecting Communication: Difficulty expressing self  Cognition Arousal: Alert Behavior During Therapy: Flat affect, Restless                           PT - Cognition Comments: A&Ox1 to person only; follows commands very inconsistently Following commands: Impaired Following commands impaired: Follows one step commands inconsistently, Follows one step commands with increased time    Cueing Cueing Techniques: Verbal cues, Tactile cues, Gestural cues  Exercises      General  Comments        Pertinent Vitals/Pain Pain Assessment Pain Assessment: No/denies pain    Home Living                          Prior Function            PT Goals (current goals can now be found in the care plan section) Acute Rehab PT Goals PT Goal Formulation: Patient unable to participate in goal setting Time For Goal Achievement: 10/03/23 Progress towards PT goals: Progressing toward goals    Frequency    Min 2X/week      PT Plan       Co-evaluation              AM-PAC PT 6 Clicks Mobility   Outcome Measure  Help needed turning from your back to your side while in a flat bed without using bedrails?: A Little Help needed moving from lying on your back to sitting on the side of a flat bed without using bedrails?: A Little Help needed moving to and from a bed to a chair (including a wheelchair)?: A Little Help needed standing up from a chair using your arms (e.g., wheelchair or bedside chair)?: A Little Help needed to walk in hospital room?: A Lot Help needed climbing 3-5 steps with a railing? : Total 6 Click Score: 15    End of Session   Activity Tolerance: Patient tolerated treatment well Patient left: in bed;with call bell/phone within reach;with bed alarm set Nurse Communication: Mobility status PT Visit Diagnosis: Unsteadiness on feet (R26.81);Other abnormalities of gait and mobility (R26.89);Muscle weakness (generalized) (M62.81)     Time: 8972-8959 PT Time Calculation (min) (ACUTE ONLY): 13 min  Charges:    $Therapeutic Activity: 8-22 mins PT General Charges $$ ACUTE PT VISIT: 1 Visit                     Maryanne Finder, PT, DPT Physical Therapist - [facility]  [facility]    Paulena Servais A Hiilei Gerst 09/23/2023, 11:44 AM

## 2023-09-23 NOTE — Transfer of Care (Signed)
 Immediate Anesthesia Transfer of Care Note  Patient: [name]  Procedure(s) Performed: KINGSTON SIDE  Patient Location: PACU  Anesthesia Type:General  Level of Consciousness: drowsy  Airway & Oxygen  Therapy: Patient Spontanous Breathing and Patient connected to nasal cannula oxygen   Post-op Assessment: Report given to RN and Post -op Vital signs reviewed and stable  Post vital signs: Reviewed and stable  Last Vitals:  Vitals Value Taken Time  BP    Temp    Pulse    Resp    SpO2      Last Pain:  Vitals:   09/23/23 1145  TempSrc: Temporal  PainSc: 0-No pain         Complications: No notable events documented.

## 2023-09-23 NOTE — Anesthesia Preprocedure Evaluation (Signed)
 Anesthesia Evaluation  Patient identified by MRN, date of birth, ID band Patient awake    Reviewed: Allergy & Precautions, H&P , NPO status , Patient's Chart, lab work & pertinent test results, reviewed documented beta blocker date and time   Airway Mallampati: III   Neck ROM: full    Dental  (+) Poor Dentition   Pulmonary sleep apnea , COPD, former smoker   Pulmonary exam normal        Cardiovascular Exercise Tolerance: Poor hypertension, + Past MI, + Peripheral Vascular Disease and +CHF  Normal cardiovascular exam Rhythm:regular Rate:Normal     Neuro/Psych Consent from daughter. ja CVA, Residual Symptoms  negative psych ROS   GI/Hepatic negative GI ROS, Neg liver ROS,,,  Endo/Other  negative endocrine ROSdiabetes    Renal/GU Renal disease  negative genitourinary   Musculoskeletal   Abdominal   Peds  Hematology  (+) Blood dyscrasia, anemia   Anesthesia Other Findings Past Medical History: No date: Acute deep vein thrombosis (DVT) of femoral vein of left  lower extremity (HCC) No date: Atrial fibrillation (HCC) No date: CHF (congestive heart failure) (HCC) No date: Chronic kidney disease No date: Diabetes (HCC) No date: Hearing loss No date: Hyperlipidemia No date: Liver cyst No date: Nephrolithiasis No date: OSA (obstructive sleep apnea) No date: Prostate cancer (HCC) No date: SSS (sick sinus syndrome) (HCC) No date: Stroke [facility]) No date: Valvular heart disease Past Surgical History: No date: CATARACT EXTRACTION 07/02/2019: COLONOSCOPY WITH PROPOFOL ; N/A     Comment:  Procedure: COLONOSCOPY WITH PROPOFOL ;  Surgeon: Unk Corinn Skiff, MD;  Location: [facility] ENDOSCOPY;  Service:               Gastroenterology;  Laterality: N/A; 03/19/2022: ESOPHAGOGASTRODUODENOSCOPY (EGD) WITH PROPOFOL ; N/A     Comment:  Procedure: ESOPHAGOGASTRODUODENOSCOPY (EGD) WITH               PROPOFOL ;  Surgeon: Therisa Bi, MD;  Location: [facility]               ENDOSCOPY;  Service: Gastroenterology;  Laterality: N/A; 08/27/2019: IR CT HEAD LTD 06/28/2023: IR CT HEAD LTD 08/27/2019: IR PERCUTANEOUS ART THROMBECTOMY/INFUSION INTRACRANIAL INC  DIAG ANGIO     Comment:    08/27/2019: IR PERCUTANEOUS ART THROMBECTOMY/INFUSION INTRACRANIAL INC  DIAG ANGIO 06/28/2023: IR PERCUTANEOUS ART THROMBECTOMY/INFUSION INTRACRANIAL INC  DIAG ANGIO 06/24/2023: LEFT HEART CATH AND CORONARY ANGIOGRAPHY; N/A     Comment:  Procedure: LEFT HEART CATH AND CORONARY ANGIOGRAPHY;                Surgeon: Ammon Blunt, MD;  Location: [facility]               INVASIVE CV LAB;  Service: Cardiovascular;  Laterality:               N/A; 12/25/2020: LOWER EXTREMITY ANGIOGRAPHY; Left     Comment:  Procedure: Lower Extremity Angiography;  Surgeon: Marea Selinda RAMAN, MD;  Location: [facility] INVASIVE CV LAB;  Service:               Cardiovascular;  Laterality: Left; 12/19/2020: PACEMAKER LEADLESS INSERTION; N/A     Comment:  Procedure: PACEMAKER LEADLESS INSERTION;  Surgeon:               Ammon Blunt, MD;  Location: [facility] INVASIVE CV  LAB;  Service: Cardiovascular;  Laterality: N/A; No date: PROSTATE CANCER No date: PROSTATE SURGERY 08/27/2019: RADIOLOGY WITH ANESTHESIA; N/A     Comment:  Procedure: IR WITH ANESTHESIA;  Surgeon: Dolphus Carrion, MD;  Location: [facility] OR;  Service: Radiology;                Laterality: N/A; 06/28/2023: RADIOLOGY WITH ANESTHESIA; N/A     Comment:  Procedure: RADIOLOGY WITH ANESTHESIA;  Surgeon:               Radiologist, Medication, MD;  Location: [facility] OR;  Service:               Radiology;  Laterality: N/A; BMI    Body Mass Index: 27.34 kg/m     Reproductive/Obstetrics negative OB ROS                              Anesthesia Physical Anesthesia Plan  ASA: 3  Anesthesia Plan: General   Post-op Pain Management:    Induction:   PONV Risk Score  and Plan:   Airway Management Planned:   Additional Equipment:   Intra-op Plan:   Post-operative Plan:   Informed Consent: I have reviewed the patients History and Physical, chart, labs and discussed the procedure including the risks, benefits and alternatives for the proposed anesthesia with the patient or authorized representative who has indicated his/her understanding and acceptance.     Dental Advisory Given  Plan Discussed with: CRNA  Anesthesia Plan Comments:         Anesthesia Quick Evaluation

## 2023-09-23 NOTE — Op Note (Signed)
 [facility] Patient Name: [name] Procedure Date: 09/23/2023 12:23 PM MRN: [id number] Account #: [id number] Date of Birth: 20-Aug-1933 Admit Type: Inpatient Age: 89 Room: [facility] ENDO ROOM 3 Gender: Male Note Status: Finalized Instrument Name: Peds Colonoscope [id number] Procedure:             Flexible Sigmoidoscopy Indications:           Hematochezia Providers:             Daryel Kenneth K. Aundria MD, MD Referring MD:          Saralee Stank, MD (Referring MD), No Local Md, MD                         (Referring MD) Medicines:             Propofol  per Anesthesia Complications:         No immediate complications. Estimated blood loss: None. Procedure:             Pre-Anesthesia Assessment:                        - The risks and benefits of the procedure and the                         sedation options and risks were discussed with the                         patient. All questions were answered and informed                         consent was obtained.                        - Patient identification and proposed procedure were                         verified prior to the procedure by the nurse. The                         procedure was verified in the procedure room.                        - ASA Grade Assessment: III - A patient with severe                         systemic disease.                        - After reviewing the risks and benefits, the patient                         was deemed in satisfactory condition to undergo the                         procedure.                        After obtaining informed consent, the scope was passed  under direct vision. The Colonoscope was introduced                         through the anus and advanced to the the sigmoid                         colon. The flexible sigmoidoscopy was somewhat                         difficult due to inadequate bowel prep. The quality of                          the bowel preparation was not adequate to identify                         polyps greater than 5 mm in size. Findings:      The perianal examination was normal.      The digital rectal exam findings include decreased tone, stool ball       palpated with gloved finger.      Copious quantities of solid stool was found in the rectum, interfering       with visualization. Lavage of the area was performed using a moderate       amount of sterile water, resulting in clearance with fair visualization.       Estimated blood loss: none.      A single (solitary) twenty mm ulcer was found in the rectum. No bleeding       was present. Stigmata of recent bleeding were present. Estimated blood       loss: none. Estimated blood loss: none.      A single medium-mouthed diverticulum was found in the sigmoid colon. Impression:            - Preparation of the colon was inadequate.                        - Decreased tone, stool ball palpated with gloved                         finger. found on digital rectal exam.                        - Stool in the rectum.                        - A single (solitary) ulcer in the rectum.                        - Diverticulosis in the sigmoid colon.                        - No specimens collected. Recommendation:        - Return patient to hospital ward for ongoing care.                        - Continue laxatives, stool softeners. Consider PO mg                         citrate or miralax  prep (half prep).                        -  [employer] will sign off for now. Call us  back if any                         changes clinically or if we can help for any reason. Procedure Code(s):     --- Professional ---                        [phone], Sigmoidoscopy, flexible; diagnostic, including                         collection of specimen(s) by brushing or washing, when                         performed (separate procedure) Diagnosis Code(s):     --- Professional ---                         K57.30, Diverticulosis of large intestine without                         perforation or abscess without bleeding                        K92.1, Melena (includes Hematochezia)                        K62.6, Ulcer of anus and rectum CPT copyright 2022 [employer]. All rights reserved. The codes documented in this report are preliminary and upon coder review may  be revised to meet current compliance requirements. Ladell MARLA Boss MD, MD 09/23/2023 12:41:45 PM This report has been signed electronically. Number of Addenda: 0 Note Initiated On: 09/23/2023 12:23 PM Total Procedure Duration: 0 hours 2 minutes 7 seconds  Estimated Blood Loss:  Estimated blood loss: none. Estimated blood loss: none.      [facility]

## 2023-09-24 ENCOUNTER — Encounter: Payer: Self-pay | Admitting: Internal Medicine

## 2023-09-24 DIAGNOSIS — G9341 Metabolic encephalopathy: Secondary | ICD-10-CM | POA: Diagnosis not present

## 2023-09-24 LAB — CBC
HCT: 43.2 % (ref 39.0–52.0)
Hemoglobin: 14.1 g/dL (ref 13.0–17.0)
MCH: 30.9 pg (ref 26.0–34.0)
MCHC: 32.6 g/dL (ref 30.0–36.0)
MCV: 94.5 fL (ref 80.0–100.0)
Platelets: 216 K/uL (ref 150–400)
RBC: 4.57 MIL/uL (ref 4.22–5.81)
RDW: 15.8 % — ABNORMAL HIGH (ref 11.5–15.5)
WBC: 6 K/uL (ref 4.0–10.5)
nRBC: 0 % (ref 0.0–0.2)

## 2023-09-24 LAB — BASIC METABOLIC PANEL WITH GFR
Anion gap: 13 (ref 5–15)
BUN: 35 mg/dL — ABNORMAL HIGH (ref 8–23)
CO2: 23 mmol/L (ref 22–32)
Calcium: 9.1 mg/dL (ref 8.9–10.3)
Chloride: 102 mmol/L (ref 98–111)
Creatinine, Ser: 1.88 mg/dL — ABNORMAL HIGH (ref 0.61–1.24)
GFR, Estimated: 34 mL/min — ABNORMAL LOW (ref >60)
Glucose, Bld: 157 mg/dL — ABNORMAL HIGH (ref 70–99)
Potassium: 4.8 mmol/L (ref 3.5–5.1)
Sodium: 138 mmol/L (ref 135–145)

## 2023-09-24 LAB — CULTURE, BLOOD (ROUTINE X 2)
Culture: NO GROWTH
Culture: NO GROWTH
Special Requests: ADEQUATE
Special Requests: ADEQUATE

## 2023-09-24 LAB — GLUCOSE, CAPILLARY
Glucose-Capillary: 229 mg/dL — ABNORMAL HIGH (ref 70–99)
Glucose-Capillary: 113 mg/dL — ABNORMAL HIGH (ref 70–99)
Glucose-Capillary: 107 mg/dL — ABNORMAL HIGH (ref 70–99)
Glucose-Capillary: 116 mg/dL — ABNORMAL HIGH (ref 70–99)

## 2023-09-24 MED ORDER — ENZALUTAMIDE 40 MG PO TABS
160.0000 mg | ORAL_TABLET | Freq: Every day | ORAL | Status: DC
  Administered 2023-09-25 – 2023-10-01 (×7): 160 mg via ORAL
  Filled 2023-09-24 (×7): qty 4

## 2023-09-24 MED ORDER — FUROSEMIDE 20 MG PO TABS
20.0000 mg | ORAL_TABLET | Freq: Every day | ORAL | Status: DC
  Filled 2023-09-24: qty 1

## 2023-09-24 MED ORDER — METOPROLOL TARTRATE 25 MG PO TABS
25.0000 mg | ORAL_TABLET | Freq: Two times a day (BID) | ORAL | Status: DC
Start: 2023-09-24 — End: 2023-09-25
  Administered 2023-09-24 (×2): 25 mg via ORAL
  Filled 2023-09-24 (×2): qty 1

## 2023-09-24 NOTE — Plan of Care (Signed)

## 2023-09-24 NOTE — Hospital Course (Addendum)
 Hospital course / significant events:   HPI: [name] is a 89 y.o. male with medical history significant of hypertension, hyperlipidemia, T2DM, permanent atrial fibrillation on edoxaban , SSS s/p PPM, HFrEF with EF of 45 to 50% -06/24/2023), V-Tach on amiodarone , stage IVb metastatic prostate cancer on Xtandi  and leuprolide  who presents to the emergency department from home via EMS due to 2-week onset of progressive worsening confusion.  Patient was unable to provide history, history was obtained from EDP, ED medical record and daughter-in-law at bedside.  Per report, patient lives at home with wife who has dementia and he was able to take care of his ADLs and care for his wife.  However, since past 2-3 weeks, family checked on him from time to time noted changes in cognition and several episodes of diarrhea.  It was also noted that he has not been taking his medications including amiodarone  as prescribed (he was taking more pills than he was supposed to), assumptions made based on number of pills in his pillbox.  His occupational therapist followed up with him and noted he was covered in stool and was more confused, so he requested for him to be taken to the ED for further evaluation and management.   Of note: was admitted from 6/2 to 6/6 due to monomorphic V. tach during which he underwent t LHC with Dr. Ammon (06/03) and revealed 40% stenosed Ost/prox LAD, mild LV systolic dysfunction. He was also admitted from 6/7 to 6/20 due to left MCA infarct with left M1 occlusion s/p mechanical thrombectomy with TICI3 revascularization, etiology likely A-fib on DOAC in the setting of recent cardioversion.   On this admission Encephalopathy likely multifactorial w/ recent stroke affecting mentation. MRI nothing acute, no anemia, no electrolyte abnormalities, normal tsh, normal LFTs. Diarrhea/dehydration w/ AKI also might be contirbuting (+)Norovirus explains diarrhea  Hematochezia - appreciate GI input.   Patient had a flexible sigmoidoscopy which showed a rectal ulcer. Record review previously there was plan to stop anticoagulation but pt appears to have been taking this at home. Will dc this medication.  Renal function has continued to worsen, 09/26/23 trial fluids (low po intake) still worsening, not improving --> renal US , repeat UA, get postvoid residual, nephro consult - suspect advancing CKD? Per nephrology, ok to follow on discharge --> continues to be worse into 09/07, no change to plan per nephrology as UOP is ok, no need dialysis     Consultants:  Gastroenterology   Procedures/Surgeries: 09/23/23 flexible sigmoidoscopy       ASSESSMENT & PLAN:   Acute encephalopathy - improved/resolved  Likely multifactorial, recent stroke biggest contributor.  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 (baseline Cr 1.4-1.5 and GFR 45-48 in 06/2023 though previous hx noted CKD3b per notes)  Question progression to CKD3b/4 (Cr here has been 1.8-2 and GFR 30-35 --> Cr 2.9 and GFR 20 09/27/23 --> 3.18 and 18 09/28/23)  Secondary to GI losses from diarrhea, poor po intake vs possible advancing CKD Renal US  - medical renal disease as expected  Repeat  UA + micro Calculate FeNa Monitor postvoid residual  monitor BMP Nephrology consult -  should be ok to follow outpatient and no concern for discharge / no need to start dialysis    Hx v-tach History of A-fib Has pacer Continue amiodarone  for rate control    T2DM Euglycemic Hold home farxiga  for now per nephrology  SSI ac for now, of note pt will not do ok w/ taking insulin  / injectables at home per his POA   PAD Non-obstructive CAD Continue home statin Resume anticoag per daughter request/preference see above  HFrEF Last echo on file 06/24/2023 - LVEF of 45 to 50%, 2D echocardiogram which was done 06/24 Continue to hold home farxiga  w/ low BP holding lasix  for now d/t AKI/CKD, will diurese if clinically fluid up  holding metoprolol  d/t low BP Cautious fluids today given worsening renal function   Hx DVT Anticoagulant discontinued back in June per record review, daughter requests/prefers he is on this given CVA hx, see above    Metastatic prostate cancer Can resume home xtandi      COPD Quiescent/stable  incruse for home spiriva     overweight based on BMI: Body mass index is 27.34 kg/m.SABRA Significantly low or high BMI is associated with higher medical risk.  Underweight - under 18  overweight - 25 to 29 obese - 30 or more Class 1 obesity: BMI of 30.0 to 34 Class 2 obesity: BMI of 35.0 to 39 Class 3 obesity: BMI of 40.0 to 49 Super Morbid Obesity: BMI 50-59 Super-super Morbid Obesity: BMI 60+ Healthy nutrition and physical activity advised as adjunct to other disease management and risk reduction treatments   DVT prophylaxis: SCD IV fluids: no continuous IV fluids  Nutrition: cardiac/carb diet Central lines / other devices: none  Code Status: FULL CODE ACP documentation reviewed: HCPOA on file in [facility]  TOC needs: SNF placement will need re-do auth over the weekend  Medical barriers to dispo: renal function - if improving/stable can plan for dc

## 2023-09-24 NOTE — Progress Notes (Signed)
 PROGRESS NOTE    [name]   FMW:[id number] DOB: 1934/01/13  DOA: 09/18/2023 Date of Service: 09/24/23 which is hospital day 6  PCP: Liana Fish, NP    Hospital course / significant events:   HPI: [name] is a 89 y.o. male with medical history significant of hypertension, hyperlipidemia, T2DM, permanent atrial fibrillation on edoxaban , SSS s/p PPM, HFrEF with EF of 45 to 50% -06/24/2023), V-Tach on amiodarone , stage IVb metastatic prostate cancer on Xtandi  and leuprolide  who presents to the emergency department from home via EMS due to 2-week onset of progressive worsening confusion.  Patient was unable to provide history, history was obtained from EDP, ED medical record and daughter-in-law at bedside.  Per report, patient lives at home with wife who has dementia and he was able to take care of his ADLs and care for his wife.  However, since past 2-3 weeks, family checked on him from time to time noted changes in cognition and several episodes of diarrhea.  It was also noted that he has not been taking his medications including amiodarone  as prescribed (he was taking more pills than he was supposed to), assumptions made based on number of pills in his pillbox.  His occupational therapist followed up with him and noted he was covered in stool and was more confused, so he requested for him to be taken to the ED for further evaluation and management.   Patient was admitted from 6/2 to 6/6 due to monomorphic V. tach during which he underwent t LHC with Dr. Ammon (06/03) and revealed 40% stenosed Ost/prox LAD, mild LV systolic dysfunction. He was also admitted from 6/7 to 6/20 due to left MCA infarct with left M1 occlusion s/p mechanical thrombectomy with TICI3 revascularization, etiology likely A-fib on DOAC in the setting of recent cardioversion.     Appreciate GI input.  Patient had a flexible sigmoidoscopy which showed a rectal ulcer   Consultants:  Gastroenterology    Procedures/Surgeries: 09/23/23 flexible sigmoidoscopy       ASSESSMENT & PLAN:   Acute encephalopathy Likely multifactorial, recent stroke biggest contributor.  Work up here unrevealing except for AKI. MRI nothing acute, no anemia, no electrolyte abnormalities, normal tsh, normal LFTs Continues to have diarrhea and stool is positive for norovirus Continue supportive care   Diarrhea d/t norovirus Supportive care    Hematochezia/epistaxis d/t rectal ulcer Anticoagulation/antiplatelet is contributing factor  Recurrent episodes of rectal bleeding Noted to have a mild drop in his H&H from 14.4 >> 13.5 Edoxaban  is currently on hold Appreciate GI input.  Patient had a flexible sigmoidoscopy which showed a rectal ulcer, continue hold anticoag/antiplatelet for now    Debility Resides independently at home with wife who has dementia, will require at minimum significantly more support at home Appreciate PT input, recommend SNF on discharge   AKI on ckd 3a Baseline cr around 1.4, serum creatinine is down to 1.8 with IV fluid hydration Secondary to GI losses from diarrhea Encourage oral fluid intake Cautiously restart lasix     Hx v-tach History of A-fib Has pacer Continue amiodarone  and metoprolol  for rate control   T2DM Euglycemic Hold home farxiga    PAD Non-obstructive CAD Continue home statin  HFrEF Last known LVEF of 45 to 50%, 2D echocardiogram which was done 06/24 Continue to hold home farxiga  Cautiously restart lasix  for now  Continue metoprolol    Hx DVT Anticoagulant discontinued back in June per record review    Metastatic prostate cancer Can resume home xtandi   COPD Quiescent incruse for home spiriva       overweight based on BMI: Body mass index is 27.34 kg/m.SABRA Significantly low or high BMI is associated with higher medical risk.  Underweight - under 18  overweight - 25 to 29 obese - 30 or more Class 1 obesity: BMI of 30.0 to 34 Class 2  obesity: BMI of 35.0 to 39 Class 3 obesity: BMI of 40.0 to 49 Super Morbid Obesity: BMI 50-59 Super-super Morbid Obesity: BMI 60+ Healthy nutrition and physical activity advised as adjunct to other disease management and risk reduction treatments    DVT prophylaxis: SCD IV fluids: no continuous IV fluids  Nutrition: cardiac/carb diet Central lines / other devices: none  Code Status: FULL CODE ACP documentation reviewed: HCPOA on file in [facility]  TOC needs: SNF placement Medical barriers to dispo: none.              Subjective / Brief ROS:  Patient reports feeling well today no concern Denies CP/SOB.  Pain controlled.  Denies new weakness.  Tolerating diet.  Reports no concerns w/ urination/defecation.   Family Communication: none at this time     Objective Findings:  Vitals:   09/23/23 2023 09/24/23 0316 09/24/23 0851 09/24/23 0913  BP: 101/68 121/85 109/67 95/65  Pulse: 70 70 72 71  Resp: 18 18 17    Temp: 98 F (36.7 C) 98 F (36.7 C) 98 F (36.7 C)   TempSrc: Oral     SpO2: 98% 100% 97%   Weight:      Height:        Intake/Output Summary (Last 24 hours) at 09/24/2023 1505 Last data filed at 09/24/2023 1000 Gross per 24 hour  Intake 150 ml  Output 300 ml  Net -150 ml   Filed Weights   09/18/23 1546  Weight: 94 kg    Examination:  Physical Exam       Scheduled Medications:   amiodarone   200 mg Oral Daily   atorvastatin   10 mg Oral Daily   edoxaban   30 mg Oral Q24H   [START ON 09/25/2023] enzalutamide   160 mg Oral Daily   feeding supplement (GLUCERNA SHAKE)  237 mL Oral TID BM   [START ON 09/25/2023] furosemide   20 mg Oral Daily   insulin  aspart  0-9 Units Subcutaneous TID WC   metoprolol  tartrate  25 mg Oral BID   pantoprazole   40 mg Oral Daily   polyethylene glycol  17 g Oral Daily   umeclidinium bromide   1 puff Inhalation Daily   verapamil   120 mg Oral QHS    Continuous Infusions:   PRN Medications:  acetaminophen  **OR**  acetaminophen , prochlorperazine   Antimicrobials from admission:  Anti-infectives (From admission, onward)    None           Data Reviewed:  I have personally reviewed the following...  CBC: Recent Labs  Lab 09/18/23 1554 09/18/23 2332 09/20/23 0922 09/20/23 1815 09/21/23 0622 09/22/23 1017 09/24/23 0211  WBC 7.5   < > 5.2 5.6 5.5 5.6 6.0  NEUTROABS 5.4  --  3.2  --   --   --   --   HGB 15.6   < > 14.1 14.0 14.4 13.5 14.1  HCT 47.6   < > 43.3 44.2 44.4 41.7 43.2  MCV 94.6   < > 94.7 96.3 95.3 95.0 94.5  PLT 181   < > 159 160 174 188 216   < > = values in this interval not displayed.  Basic Metabolic Panel: Recent Labs  Lab 09/19/23 0352 09/20/23 0922 09/21/23 0622 09/22/23 1017 09/22/23 1729 09/24/23 0211  NA 137 138 139 137 136 138  K 4.2 4.1 4.5 4.4 4.7 4.8  CL 104 104 108 104 102 102  CO2 24 24 24 23 26 23   GLUCOSE 186* 145* 124* 246* 129* 157*  BUN 46* 38* 35* 38* 41* 35*  CREATININE 2.44* 1.95* 1.84* 1.99* 2.09* 1.88*  CALCIUM  9.1 9.0 9.1 8.9 9.1 9.1  MG 2.3  --   --   --  2.1  --   PHOS 3.0  --   --   --   --   --    GFR: Estimated Creatinine Clearance: 30.7 mL/min (A) (by C-G formula based on SCr of 1.88 mg/dL (H)). Liver Function Tests: Recent Labs  Lab 09/18/23 1554 09/19/23 0352  AST 19 19  ALT 12 9  ALKPHOS 106 100  BILITOT 0.8 0.6  PROT 7.5 6.8  ALBUMIN  3.3* 2.9*   No results for input(s): LIPASE, AMYLASE in the last 168 hours. No results for input(s): AMMONIA in the last 168 hours. Coagulation Profile: Recent Labs  Lab 09/19/23 1448  INR 1.4*   Cardiac Enzymes: No results for input(s): CKTOTAL, CKMB, CKMBINDEX, TROPONINI in the last 168 hours. BNP (last 3 results) No results for input(s): PROBNP in the last 8760 hours. HbA1C: No results for input(s): HGBA1C in the last 72 hours. CBG: Recent Labs  Lab 09/23/23 1153 09/23/23 1628 09/23/23 2022 09/24/23 0851 09/24/23 1155  GLUCAP 100* 181* 105* 229*  113*   Lipid Profile: No results for input(s): CHOL, HDL, LDLCALC, TRIG, CHOLHDL, LDLDIRECT in the last 72 hours. Thyroid  Function Tests: No results for input(s): TSH, T4TOTAL, FREET4, T3FREE, THYROIDAB in the last 72 hours. Anemia Panel: No results for input(s): VITAMINB12, FOLATE, FERRITIN, TIBC, IRON, RETICCTPCT in the last 72 hours. Most Recent Urinalysis On File:     Component Value Date/Time   COLORURINE YELLOW (A) 09/18/2023 1554   APPEARANCEUR CLEAR (A) 09/18/2023 1554   APPEARANCEUR Clear 12/20/2021 1615   LABSPEC 1.013 09/18/2023 1554   LABSPEC 1.015 10/12/2013 1445   PHURINE 5.0 09/18/2023 1554   GLUCOSEU >=500 (A) 09/18/2023 1554   GLUCOSEU 50 mg/dL 90/77/7984 8554   HGBUR MODERATE (A) 09/18/2023 1554   BILIRUBINUR NEGATIVE 09/18/2023 1554   BILIRUBINUR Negative 12/20/2021 1615   BILIRUBINUR Negative 10/12/2013 1445   KETONESUR NEGATIVE 09/18/2023 1554   PROTEINUR NEGATIVE 09/18/2023 1554   UROBILINOGEN 0.2 06/29/2019 1556   NITRITE NEGATIVE 09/18/2023 1554   LEUKOCYTESUR NEGATIVE 09/18/2023 1554   LEUKOCYTESUR Negative 10/12/2013 1445   Sepsis Labs: @LABRCNTIP (procalcitonin:4,lacticidven:4) Microbiology: Recent Results (from the past 240 hours)  Urine Culture (for pregnant, neutropenic or urologic patients or patients with an indwelling urinary catheter)     Status: None   Collection Time: 09/18/23  3:54 PM   Specimen: Urine, Clean Catch  Result Value Ref Range Status   Specimen Description   Final    URINE, CLEAN CATCH Performed at [facility], [address], [place], [place] [address]    Special Requests   Final    NONE Performed at [facility], [address], [place], [place] [address]    Culture   Final    NO GROWTH Performed at [facility], [address] [place], [place] [address]    Report Status 09/20/2023 FINAL  Final  Culture, blood (Routine X 2) w Reflex to ID Panel  Status:  None   Collection Time: 09/19/23  2:48 PM   Specimen: BLOOD  Result Value Ref Range Status   Specimen Description   Final    BLOOD BLOOD LEFT ARM Performed at [facility], [address] [place], [place] [address]    Special Requests   Final    BOTTLES DRAWN AEROBIC AND ANAEROBIC Blood Culture adequate volume Performed at [facility], [address] [place], [place] [address]    Culture   Final    NO GROWTH 5 DAYS Performed at [facility], [address], [place], [place] [address]    Report Status 09/24/2023 FINAL  Final  Culture, blood (Routine X 2) w Reflex to ID Panel     Status: None   Collection Time: 09/19/23  2:48 PM   Specimen: BLOOD  Result Value Ref Range Status   Specimen Description   Final    BLOOD BLOOD LEFT HAND Performed at [facility], [address], [place], [place] [address]    Special Requests   Final    BOTTLES DRAWN AEROBIC AND ANAEROBIC Blood Culture adequate volume Performed at [facility], [address], [place], [place] [address]    Culture   Final    NO GROWTH 5 DAYS Performed at [facility], [address], [place], [place] [address]    Report Status 09/24/2023 FINAL  Final  Resp panel by RT-PCR (RSV, Flu A&B, Covid) Anterior Nasal Swab     Status: None   Collection Time: 09/19/23  4:27 PM   Specimen: Anterior Nasal Swab  Result Value Ref Range Status   SARS Coronavirus 2 by RT PCR NEGATIVE NEGATIVE Final    Comment: (NOTE) SARS-CoV-2 target nucleic acids are NOT DETECTED.  The SARS-CoV-2 RNA is generally detectable in upper respiratory specimens during the acute phase of infection. The lowest concentration of SARS-CoV-2 viral copies this assay can detect is 138 copies/mL. A negative result does not preclude SARS-Cov-2 infection and should not be used as the sole basis for treatment or other patient management decisions. A negative result may occur with  improper specimen  collection/handling, submission of specimen other than nasopharyngeal swab, presence of viral mutation(s) within the areas targeted by this assay, and inadequate number of viral copies(<138 copies/mL). A negative result must be combined with clinical observations, patient history, and epidemiological information. The expected result is Negative.  Fact Sheet for Patients:  BloggerCourse.com  Fact Sheet for Healthcare Providers:  SeriousBroker.it  This test is no t yet approved or cleared by the [place]  FDA and  has been authorized for detection and/or diagnosis of SARS-CoV-2 by FDA under an Emergency Use Authorization (EUA). This EUA will remain  in effect (meaning this test can be used) for the duration of the COVID-19 declaration under Section 564(b)(1) of the Act, 21 U.S.C.section 360bbb-3(b)(1), unless the authorization is terminated  or revoked sooner.       Influenza A by PCR NEGATIVE NEGATIVE Final   Influenza B by PCR NEGATIVE NEGATIVE Final    Comment: (NOTE) The Xpert Xpress SARS-CoV-2/FLU/RSV plus assay is intended as an aid in the diagnosis of influenza from Nasopharyngeal swab specimens and should not be used as a sole basis for treatment. Nasal washings and aspirates are unacceptable for Xpert Xpress SARS-CoV-2/FLU/RSV testing.  Fact Sheet for Patients: BloggerCourse.com  Fact Sheet for Healthcare Providers: SeriousBroker.it  This test is not yet approved or cleared by the [place]  FDA and has  been authorized for detection and/or diagnosis of SARS-CoV-2 by FDA under an Emergency Use Authorization (EUA). This EUA will remain in effect (meaning this test can be used) for the duration of the COVID-19 declaration under Section 564(b)(1) of the Act, 21 U.S.C. section 360bbb-3(b)(1), unless the authorization is terminated or revoked.     Resp Syncytial  Virus by PCR NEGATIVE NEGATIVE Final    Comment: (NOTE) Fact Sheet for Patients: BloggerCourse.com  Fact Sheet for Healthcare Providers: SeriousBroker.it  This test is not yet approved or cleared by the [place]  FDA and has been authorized for detection and/or diagnosis of SARS-CoV-2 by FDA under an Emergency Use Authorization (EUA). This EUA will remain in effect (meaning this test can be used) for the duration of the COVID-19 declaration under Section 564(b)(1) of the Act, 21 U.S.C. section 360bbb-3(b)(1), unless the authorization is terminated or revoked.  Performed at [facility], [address] [place], [place] [address]   Gastrointestinal Panel by PCR , Stool     Status: Abnormal   Collection Time: 09/19/23  8:00 PM   Specimen: STOOL  Result Value Ref Range Status   Campylobacter species NOT DETECTED NOT DETECTED Final   Plesimonas shigelloides NOT DETECTED NOT DETECTED Final   Salmonella species NOT DETECTED NOT DETECTED Final   Yersinia enterocolitica NOT DETECTED NOT DETECTED Final   Vibrio species NOT DETECTED NOT DETECTED Final   Vibrio cholerae NOT DETECTED NOT DETECTED Final   Enteroaggregative E coli (EAEC) NOT DETECTED NOT DETECTED Final   Enteropathogenic E coli (EPEC) NOT DETECTED NOT DETECTED Final   Enterotoxigenic E coli (ETEC) NOT DETECTED NOT DETECTED Final   Shiga like toxin producing E coli (STEC) NOT DETECTED NOT DETECTED Final   Shigella/Enteroinvasive E coli (EIEC) NOT DETECTED NOT DETECTED Final   Cryptosporidium NOT DETECTED NOT DETECTED Final   Cyclospora cayetanensis NOT DETECTED NOT DETECTED Final   Entamoeba histolytica NOT DETECTED NOT DETECTED Final   Giardia lamblia NOT DETECTED NOT DETECTED Final   Adenovirus F40/41 NOT DETECTED NOT DETECTED Final   Astrovirus NOT DETECTED NOT DETECTED Final   Norovirus GI/GII DETECTED (A) NOT DETECTED Final    Comment: RESULT CALLED TO,  READ BACK BY AND VERIFIED WITH: JOYYA BAYNUM, LPN @0437  09/20/2023 COP    Rotavirus A NOT DETECTED NOT DETECTED Final   Sapovirus (I, II, IV, and V) NOT DETECTED NOT DETECTED Final    Comment: Performed at [facility], [address] [place], [place] [address]  C Difficile Quick Screen w PCR reflex     Status: None   Collection Time: 09/19/23  8:00 PM   Specimen: STOOL  Result Value Ref Range Status   C Diff antigen NEGATIVE NEGATIVE Final   C Diff toxin NEGATIVE NEGATIVE Final   C Diff interpretation No C. difficile detected.  Final    Comment: Performed at [facility], [address], [place], [place] [address]      Radiology Studies last 3 days: No results found.       Chuong Casebeer, DO [facility] 09/24/2023, 3:05 PM    Dictation software may have been used to generate the above note. Typos may occur and escape review in typed/dictated notes. Please contact Dr Marsa directly for clarity if needed.  Staff may message me via secure chat in Epic  but this may not receive an immediate response,  please page me for urgent matters!  If 7PM-7AM, please contact night coverage [email or web address]

## 2023-09-24 NOTE — Plan of Care (Signed)
   Problem: Health Behavior/Discharge Planning: Goal: Ability to manage health-related needs will improve Outcome: Progressing

## 2023-09-24 NOTE — Plan of Care (Signed)
   Problem: Clinical Measurements: Goal: Diagnostic test results will improve Outcome: Progressing

## 2023-09-24 NOTE — TOC Progression Note (Signed)
 Transition of Care [facility]) - Progression Note    Patient Details  Name: [name] MRN: [id number] Date of Birth: Dec 23, 1933  [facility] of [facility]) CM/SW Contact  Alvaro Louder, [place] Phone Number: 09/24/2023, 11:58 AM  Clinical Narrative:   9/4 SNF Auth Approved: 09/24/2023-09/26/2023 Plan Auth ID: [id number], Jluy ID: [id number]    Patient and family has selected [employer]. SNF auth currently pending.   Pending JluyPI:[id number]   TOC to follow up for discharge                      Expected Discharge Plan and Services                                               Social Drivers of Health (SDOH) Interventions SDOH Screenings   Food Insecurity: No Food Insecurity (09/18/2023)  Housing: Low Risk  (09/18/2023)  Transportation Needs: No Transportation Needs (09/18/2023)  Utilities: Patient Unable To Answer (09/18/2023)  Alcohol Screen: Low Risk  (10/26/2021)  Depression (PHQ2-9): Low Risk  (08/13/2023)  Financial Resource Strain: Low Risk  (07/28/2023)  Physical Activity: Inactive (10/29/2019)  Social Connections: Patient Unable To Answer (09/18/2023)  Stress: No Stress Concern Present (10/29/2019)  Recent Concern: Stress - Stress Concern Present (09/08/2019)  Tobacco Use: Medium Risk (09/18/2023)  Health Literacy: Inadequate Health Literacy (07/28/2023)    Readmission Risk Interventions    07/01/2023    4:26 PM 09/11/2022    1:23 PM  Readmission Risk Prevention Plan  Transportation Screening Complete Complete  Medication Review (RN Care Manager) Complete Referral to Pharmacy  PCP or Specialist appointment within 3-5 days of discharge Complete Complete  HRI or Home Care Consult Complete Complete  SW Recovery Care/Counseling Consult Complete Complete  Palliative Care Screening Not Applicable Not Applicable  Skilled Nursing Facility Not Applicable Not Applicable

## 2023-09-24 NOTE — Progress Notes (Signed)
 PT Cancellation Note  Patient Details Name: ALGIE WESTRY MRN: [id number] DOB: 1933/08/30   Cancelled Treatment:    Reason Eval/Treat Not Completed: Patient declined, no reason specified Patient declined walking or getting out of bed today. Will re-attempt tomorrow as time allows.   Maxene Byington 09/24/2023, 2:44 PM

## 2023-09-25 DIAGNOSIS — G9341 Metabolic encephalopathy: Secondary | ICD-10-CM | POA: Diagnosis not present

## 2023-09-25 LAB — GLUCOSE, CAPILLARY
Glucose-Capillary: 195 mg/dL — ABNORMAL HIGH (ref 70–99)
Glucose-Capillary: 215 mg/dL — ABNORMAL HIGH (ref 70–99)
Glucose-Capillary: 125 mg/dL — ABNORMAL HIGH (ref 70–99)
Glucose-Capillary: 143 mg/dL — ABNORMAL HIGH (ref 70–99)

## 2023-09-25 LAB — CBC
HCT: 46 % (ref 39.0–52.0)
Hemoglobin: 14.8 g/dL (ref 13.0–17.0)
MCH: 30.3 pg (ref 26.0–34.0)
MCHC: 32.2 g/dL (ref 30.0–36.0)
MCV: 94.1 fL (ref 80.0–100.0)
Platelets: 222 K/uL (ref 150–400)
RBC: 4.89 MIL/uL (ref 4.22–5.81)
RDW: 15.8 % — ABNORMAL HIGH (ref 11.5–15.5)
WBC: 6.1 K/uL (ref 4.0–10.5)
nRBC: 0 % (ref 0.0–0.2)

## 2023-09-25 LAB — BASIC METABOLIC PANEL WITH GFR
Anion gap: 10 (ref 5–15)
BUN: 32 mg/dL — ABNORMAL HIGH (ref 8–23)
CO2: 25 mmol/L (ref 22–32)
Calcium: 9 mg/dL (ref 8.9–10.3)
Chloride: 103 mmol/L (ref 98–111)
Creatinine, Ser: 2.05 mg/dL — ABNORMAL HIGH (ref 0.61–1.24)
GFR, Estimated: 31 mL/min — ABNORMAL LOW (ref >60)
Glucose, Bld: 161 mg/dL — ABNORMAL HIGH (ref 70–99)
Potassium: 4.6 mmol/L (ref 3.5–5.1)
Sodium: 138 mmol/L (ref 135–145)

## 2023-09-25 MED ORDER — EDOXABAN TOSYLATE 30 MG PO TABS
30.0000 mg | ORAL_TABLET | Freq: Every day | ORAL | Status: DC
  Administered 2023-09-26 – 2023-10-01 (×6): 30 mg via ORAL
  Filled 2023-09-25 (×6): qty 1

## 2023-09-25 NOTE — Progress Notes (Signed)
 PROGRESS NOTE    [name]   FMW:[id number] DOB: [birth year]  DOA: 09/18/2023 Date of Service: 09/25/23 which is hospital day 7  PCP: Liana Fish, NP    Hospital course / significant events:   HPI: [name] is a 89 y.o. male with medical history significant of hypertension, hyperlipidemia, T2DM, permanent atrial fibrillation on edoxaban , SSS s/p PPM, HFrEF with EF of 45 to 50% -06/24/2023), V-Tach on amiodarone , stage IVb metastatic prostate cancer on Xtandi  and leuprolide  who presents to the emergency department from home via EMS due to 2-week onset of progressive worsening confusion.  Patient was unable to provide history, history was obtained from EDP, ED medical record and daughter-in-law at bedside.  Per report, patient lives at home with wife who has dementia and he was able to take care of his ADLs and care for his wife.  However, since past 2-3 weeks, family checked on him from time to time noted changes in cognition and several episodes of diarrhea.  It was also noted that he has not been taking his medications including amiodarone  as prescribed (he was taking more pills than he was supposed to), assumptions made based on number of pills in his pillbox.  His occupational therapist followed up with him and noted he was covered in stool and was more confused, so he requested for him to be taken to the ED for further evaluation and management.   Of note: was admitted from 6/2 to 6/6 due to monomorphic V. tach during which he underwent t LHC with Dr. Ammon (06/03) and revealed 40% stenosed Ost/prox LAD, mild LV systolic dysfunction. He was also admitted from 6/7 to 6/20 due to left MCA infarct with left M1 occlusion s/p mechanical thrombectomy with TICI3 revascularization, etiology likely A-fib on DOAC in the setting of recent cardioversion.   On this admission Encephalopathy likely multifactorial w/ recent stroke affecting mentation. MRI nothing acute, no anemia, no  electrolyte abnormalities, normal tsh, normal LFTs. Diarrhea/dehydration w/ AKI also might be contirbuting (+)Norovirus explains diarrhea  Hematochezia - appreciate GI input.  Patient had a flexible sigmoidoscopy which showed a rectal ulcer. Record review previously there was plan to stop anticoagulation but pt appears to have been taking this at home. Will dc this medication.  Awaiting SNF rehab placement but now family is considering for home health    Consultants:  Gastroenterology   Procedures/Surgeries: 09/23/23 flexible sigmoidoscopy       ASSESSMENT & PLAN:   Acute encephalopathy - improved/resolved  Likely multifactorial, recent stroke biggest contributor.  Continue supportive care   Diarrhea d/t norovirus - improved  Continue supportive care    Hematochezia d/t rectal ulcer Anticoagulation/antiplatelet is contributing factor  Edoxaban  had been discontinued, will restart on discharge per daughter's request, education provided re: risk/benefit and if has another GI bleed or other complication may need to hold this indefinitely    Debility Resides independently at home with wife who has dementia,  will require at minimum significantly more support at home - d/w daughter who is reluctant for SNF    AKI on CKD3a (baseline Cr 1.4-1.5 and GFR 45-48 in 06/2023)  Question progression to CKD3b (Cr here has been 1.8-2 and GFR 30-35) Secondary to GI losses from diarrhea Encourage oral fluid intake Cr up again today (lasix  was ordered to restart but was never given) will continue to hold lasix , this may need to be prn going forward  Recheck BMP   Hx v-tach History of A-fib Has pacer Continue  amiodarone  for rate control   T2DM Euglycemic Hold home farxiga  for now   PAD Non-obstructive CAD Continue home statin Resume anticoag per daughter request/preference see above  HFrEF Last known LVEF of 45 to 50%, 2D echocardiogram which was done 06/24 Continue to hold home  farxiga  w/ low BP holding lasix  for now d/t AKI/CKD, will diurese if clinically fluid up  holding metoprolol  d/t low BP Follow outpatient    Hx DVT Anticoagulant discontinued back in June per record review, daughter requests/prefers he is on this given CVA hx, see above    Metastatic prostate cancer Can resume home xtandi      COPD Quiescent/stable  incruse for home spiriva     overweight based on BMI: Body mass index is 27.34 kg/m.SABRA Significantly low or high BMI is associated with higher medical risk.  Underweight - under 18  overweight - 25 to 29 obese - 30 or more Class 1 obesity: BMI of 30.0 to 34 Class 2 obesity: BMI of 35.0 to 39 Class 3 obesity: BMI of 40.0 to 49 Super Morbid Obesity: BMI 50-59 Super-super Morbid Obesity: BMI 60+ Healthy nutrition and physical activity advised as adjunct to other disease management and risk reduction treatments   DVT prophylaxis: SCD IV fluids: no continuous IV fluids  Nutrition: cardiac/carb diet Central lines / other devices: none  Code Status: FULL CODE ACP documentation reviewed: HCPOA on file in [facility]  TOC needs: SNF placement vs HH Medical barriers to dispo: renal function - if improving/stable tomorrow anticapte dc .              Subjective / Brief ROS:  Patient reports feeling well today no concern Denies CP/SOB.  Pain controlled.  Denies new weakness.  Tolerating diet.  Reports no concerns w/ urination/defecation.   Family Communication: spoke on phone w/ daughter tiffany 09/25/23 approx 2 pm    Objective Findings:  Vitals:   09/24/23 1500 09/24/23 1942 09/25/23 0258 09/25/23 0830  BP: 135/65 124/82 135/80 121/74  Pulse: 69 70 69 70  Resp: 17 16 18 17   Temp: 98 F (36.7 C) (!) 97.3 F (36.3 C) 97.6 F (36.4 C) 98.6 F (37 C)  TempSrc: Oral     SpO2: 100% 100% 100% 96%  Weight:      Height:        Intake/Output Summary (Last 24 hours) at 09/25/2023 1428 Last data filed at 09/25/2023  1300 Gross per 24 hour  Intake 920 ml  Output 950 ml  Net -30 ml   Filed Weights   09/18/23 1546  Weight: 94 kg    Examination:  Physical Exam Constitutional:      General: He is not in acute distress. Cardiovascular:     Rate and Rhythm: Normal rate and regular rhythm.  Pulmonary:     Effort: Pulmonary effort is normal.     Breath sounds: Normal breath sounds.  Abdominal:     General: Bowel sounds are normal.     Tenderness: There is no abdominal tenderness.  Skin:    General: Skin is warm and dry.  Neurological:     Mental Status: He is alert.  Psychiatric:        Mood and Affect: Mood normal.        Behavior: Behavior normal.          Scheduled Medications:   amiodarone   200 mg Oral Daily   atorvastatin   10 mg Oral Daily   [START ON 09/26/2023] edoxaban   30 mg Oral Daily  enzalutamide   160 mg Oral Daily   feeding supplement (GLUCERNA SHAKE)  237 mL Oral TID BM   insulin  aspart  0-9 Units Subcutaneous TID WC   pantoprazole   40 mg Oral Daily   polyethylene glycol  17 g Oral Daily   umeclidinium bromide   1 puff Inhalation Daily   verapamil   120 mg Oral QHS    Continuous Infusions:   PRN Medications:  acetaminophen  **OR** acetaminophen , prochlorperazine   Antimicrobials from admission:  Anti-infectives (From admission, onward)    None           Data Reviewed:  I have personally reviewed the following...  CBC: Recent Labs  Lab 09/18/23 1554 09/18/23 2332 09/20/23 0922 09/20/23 1815 09/21/23 0622 09/22/23 1017 09/24/23 0211 09/25/23 0517  WBC 7.5   < > 5.2 5.6 5.5 5.6 6.0 6.1  NEUTROABS 5.4  --  3.2  --   --   --   --   --   HGB 15.6   < > 14.1 14.0 14.4 13.5 14.1 14.8  HCT 47.6   < > 43.3 44.2 44.4 41.7 43.2 46.0  MCV 94.6   < > 94.7 96.3 95.3 95.0 94.5 94.1  PLT 181   < > 159 160 174 188 216 222   < > = values in this interval not displayed.   Basic Metabolic Panel: Recent Labs  Lab 09/19/23 0352 09/20/23 0922 09/21/23 0622  09/22/23 1017 09/22/23 1729 09/24/23 0211 09/25/23 0517  NA 137   < > 139 137 136 138 138  K 4.2   < > 4.5 4.4 4.7 4.8 4.6  CL 104   < > 108 104 102 102 103  CO2 24   < > 24 23 26 23 25   GLUCOSE 186*   < > 124* 246* 129* 157* 161*  BUN 46*   < > 35* 38* 41* 35* 32*  CREATININE 2.44*   < > 1.84* 1.99* 2.09* 1.88* 2.05*  CALCIUM  9.1   < > 9.1 8.9 9.1 9.1 9.0  MG 2.3  --   --   --  2.1  --   --   PHOS 3.0  --   --   --   --   --   --    < > = values in this interval not displayed.   GFR: Estimated Creatinine Clearance: 28.1 mL/min (A) (by C-G formula based on SCr of 2.05 mg/dL (H)). Liver Function Tests: Recent Labs  Lab 09/18/23 1554 09/19/23 0352  AST 19 19  ALT 12 9  ALKPHOS 106 100  BILITOT 0.8 0.6  PROT 7.5 6.8  ALBUMIN  3.3* 2.9*   No results for input(s): LIPASE, AMYLASE in the last 168 hours. No results for input(s): AMMONIA in the last 168 hours. Coagulation Profile: Recent Labs  Lab 09/19/23 1448  INR 1.4*   Cardiac Enzymes: No results for input(s): CKTOTAL, CKMB, CKMBINDEX, TROPONINI in the last 168 hours. BNP (last 3 results) No results for input(s): PROBNP in the last 8760 hours. HbA1C: No results for input(s): HGBA1C in the last 72 hours. CBG: Recent Labs  Lab 09/24/23 1155 09/24/23 1610 09/24/23 1951 09/25/23 0829 09/25/23 1145  GLUCAP 113* 107* 116* 195* 215*   Lipid Profile: No results for input(s): CHOL, HDL, LDLCALC, TRIG, CHOLHDL, LDLDIRECT in the last 72 hours. Thyroid  Function Tests: No results for input(s): TSH, T4TOTAL, FREET4, T3FREE, THYROIDAB in the last 72 hours. Anemia Panel: No results for input(s): VITAMINB12, FOLATE, FERRITIN, TIBC, IRON, RETICCTPCT in  the last 72 hours. Most Recent Urinalysis On File:     Component Value Date/Time   COLORURINE YELLOW (A) 09/18/2023 1554   APPEARANCEUR CLEAR (A) 09/18/2023 1554   APPEARANCEUR Clear 12/20/2021 1615   LABSPEC 1.013  09/18/2023 1554   LABSPEC 1.015 10/12/2013 1445   PHURINE 5.0 09/18/2023 1554   GLUCOSEU >=500 (A) 09/18/2023 1554   GLUCOSEU 50 mg/dL 90/77/7984 8554   HGBUR MODERATE (A) 09/18/2023 1554   BILIRUBINUR NEGATIVE 09/18/2023 1554   BILIRUBINUR Negative 12/20/2021 1615   BILIRUBINUR Negative 10/12/2013 1445   KETONESUR NEGATIVE 09/18/2023 1554   PROTEINUR NEGATIVE 09/18/2023 1554   UROBILINOGEN 0.2 06/29/2019 1556   NITRITE NEGATIVE 09/18/2023 1554   LEUKOCYTESUR NEGATIVE 09/18/2023 1554   LEUKOCYTESUR Negative 10/12/2013 1445   Sepsis Labs: @LABRCNTIP (procalcitonin:4,lacticidven:4) Microbiology: Recent Results (from the past 240 hours)  Urine Culture (for pregnant, neutropenic or urologic patients or patients with an indwelling urinary catheter)     Status: None   Collection Time: 09/18/23  3:54 PM   Specimen: Urine, Clean Catch  Result Value Ref Range Status   Specimen Description   Final    URINE, CLEAN CATCH Performed at [facility], [address] [place], [place] [address]    Special Requests   Final    NONE Performed at [facility], [address], [place], [place] [address]    Culture   Final    NO GROWTH Performed at [facility], [address], [place], [place] [address]    Report Status 09/20/2023 FINAL  Final  Culture, blood (Routine X 2) w Reflex to ID Panel     Status: None   Collection Time: 09/19/23  2:48 PM   Specimen: BLOOD  Result Value Ref Range Status   Specimen Description   Final    BLOOD BLOOD LEFT ARM Performed at [facility], [address] [place], [place] [address]    Special Requests   Final    BOTTLES DRAWN AEROBIC AND ANAEROBIC Blood Culture adequate volume Performed at [facility], [address], [place], [place] [address]    Culture   Final    NO GROWTH 5 DAYS Performed at [facility], [address] [place], [place] [address]    Report Status 09/24/2023 FINAL  Final   Culture, blood (Routine X 2) w Reflex to ID Panel     Status: None   Collection Time: 09/19/23  2:48 PM   Specimen: BLOOD  Result Value Ref Range Status   Specimen Description   Final    BLOOD BLOOD LEFT HAND Performed at [facility], [address], [place], [place] [address]    Special Requests   Final    BOTTLES DRAWN AEROBIC AND ANAEROBIC Blood Culture adequate volume Performed at [facility], [address], [place], [place] [address]    Culture   Final    NO GROWTH 5 DAYS Performed at [facility], [address], [place], [place] [address]    Report Status 09/24/2023 FINAL  Final  Resp panel by RT-PCR (RSV, Flu A&B, Covid) Anterior Nasal Swab     Status: None   Collection Time: 09/19/23  4:27 PM   Specimen: Anterior Nasal Swab  Result Value Ref Range Status   SARS Coronavirus 2 by RT PCR NEGATIVE NEGATIVE Final    Comment: (NOTE) SARS-CoV-2 target nucleic acids are NOT DETECTED.  The SARS-CoV-2 RNA is generally detectable in upper respiratory specimens during  the acute phase of infection. The lowest concentration of SARS-CoV-2 viral copies this assay can detect is 138 copies/mL. A negative result does not preclude SARS-Cov-2 infection and should not be used as the sole basis for treatment or other patient management decisions. A negative result may occur with  improper specimen collection/handling, submission of specimen other than nasopharyngeal swab, presence of viral mutation(s) within the areas targeted by this assay, and inadequate number of viral copies(<138 copies/mL). A negative result must be combined with clinical observations, patient history, and epidemiological information. The expected result is Negative.  Fact Sheet for Patients:  BloggerCourse.com  Fact Sheet for Healthcare Providers:  SeriousBroker.it  This test is no t yet approved or cleared by the [place]  FDA and   has been authorized for detection and/or diagnosis of SARS-CoV-2 by FDA under an Emergency Use Authorization (EUA). This EUA will remain  in effect (meaning this test can be used) for the duration of the COVID-19 declaration under Section 564(b)(1) of the Act, 21 U.S.C.section 360bbb-3(b)(1), unless the authorization is terminated  or revoked sooner.       Influenza A by PCR NEGATIVE NEGATIVE Final   Influenza B by PCR NEGATIVE NEGATIVE Final    Comment: (NOTE) The Xpert Xpress SARS-CoV-2/FLU/RSV plus assay is intended as an aid in the diagnosis of influenza from Nasopharyngeal swab specimens and should not be used as a sole basis for treatment. Nasal washings and aspirates are unacceptable for Xpert Xpress SARS-CoV-2/FLU/RSV testing.  Fact Sheet for Patients: BloggerCourse.com  Fact Sheet for Healthcare Providers: SeriousBroker.it  This test is not yet approved or cleared by the [place]  FDA and has been authorized for detection and/or diagnosis of SARS-CoV-2 by FDA under an Emergency Use Authorization (EUA). This EUA will remain in effect (meaning this test can be used) for the duration of the COVID-19 declaration under Section 564(b)(1) of the Act, 21 U.S.C. section 360bbb-3(b)(1), unless the authorization is terminated or revoked.     Resp Syncytial Virus by PCR NEGATIVE NEGATIVE Final    Comment: (NOTE) Fact Sheet for Patients: BloggerCourse.com  Fact Sheet for Healthcare Providers: SeriousBroker.it  This test is not yet approved or cleared by the [place]  FDA and has been authorized for detection and/or diagnosis of SARS-CoV-2 by FDA under an Emergency Use Authorization (EUA). This EUA will remain in effect (meaning this test can be used) for the duration of the COVID-19 declaration under Section 564(b)(1) of the Act, 21 U.S.C. section 360bbb-3(b)(1),  unless the authorization is terminated or revoked.  Performed at [facility], [address] [place], [place] [address]   Gastrointestinal Panel by PCR , Stool     Status: Abnormal   Collection Time: 09/19/23  8:00 PM   Specimen: STOOL  Result Value Ref Range Status   Campylobacter species NOT DETECTED NOT DETECTED Final   Plesimonas shigelloides NOT DETECTED NOT DETECTED Final   Salmonella species NOT DETECTED NOT DETECTED Final   Yersinia enterocolitica NOT DETECTED NOT DETECTED Final   Vibrio species NOT DETECTED NOT DETECTED Final   Vibrio cholerae NOT DETECTED NOT DETECTED Final   Enteroaggregative E coli (EAEC) NOT DETECTED NOT DETECTED Final   Enteropathogenic E coli (EPEC) NOT DETECTED NOT DETECTED Final   Enterotoxigenic E coli (ETEC) NOT DETECTED NOT DETECTED Final   Shiga like toxin producing E coli (STEC) NOT DETECTED NOT DETECTED Final   Shigella/Enteroinvasive E coli (EIEC) NOT DETECTED NOT DETECTED Final   Cryptosporidium NOT DETECTED NOT DETECTED Final  Cyclospora cayetanensis NOT DETECTED NOT DETECTED Final   Entamoeba histolytica NOT DETECTED NOT DETECTED Final   Giardia lamblia NOT DETECTED NOT DETECTED Final   Adenovirus F40/41 NOT DETECTED NOT DETECTED Final   Astrovirus NOT DETECTED NOT DETECTED Final   Norovirus GI/GII DETECTED (A) NOT DETECTED Final    Comment: RESULT CALLED TO, READ BACK BY AND VERIFIED WITH: JOYYA BAYNUM, LPN @0437  09/20/2023 COP    Rotavirus A NOT DETECTED NOT DETECTED Final   Sapovirus (I, II, IV, and V) NOT DETECTED NOT DETECTED Final    Comment: Performed at [facility], [address] [place], [place] [address]  C Difficile Quick Screen w PCR reflex     Status: None   Collection Time: 09/19/23  8:00 PM   Specimen: STOOL  Result Value Ref Range Status   C Diff antigen NEGATIVE NEGATIVE Final   C Diff toxin NEGATIVE NEGATIVE Final   C Diff interpretation No C. difficile detected.  Final    Comment:  Performed at [facility], [address], [place], [place] [address]      Radiology Studies last 3 days: No results found.       Zyanne Schumm, DO [facility] 09/25/2023, 2:28 PM    Dictation software may have been used to generate the above note. Typos may occur and escape review in typed/dictated notes. Please contact Dr Marsa directly for clarity if needed.  Staff may message me via secure chat in Epic  but this may not receive an immediate response,  please page me for urgent matters!  If 7PM-7AM, please contact night coverage [email or web address]

## 2023-09-25 NOTE — Progress Notes (Signed)
 Physical Therapy Treatment Patient Details Name: [name] MRN: [id number] DOB: 01/26/1933 Today's Date: 09/25/2023   History of Present Illness [name] is a 89 y.o. male with medical history significant of hypertension, hyperlipidemia, T2DM, permanent atrial fibrillation on edoxaban , SSS s/p PPM, HFrEF with EF of 45 to 50% -06/24/2023), V-Tach on amiodarone , stage IVb metastatic prostate cancer on Xtandi  and leuprolide  who presents to the emergency department from home via EMS due to 2-week onset of progressive worsening confusion.    PT Comments  Patient sitting in recliner on arrival and reluctant to agree to PT treatment session. Stood from [facility] with minA and incontinent of BM in standing. Transferred to [facility] with minA and RW. Required totalA for pericare. Transferred back to recliner. Discharge plan remains appropriate.     If plan is discharge home, recommend the following: A little help with walking and/or transfers;A little help with bathing/dressing/bathroom;Assistance with cooking/housework;Direct supervision/assist for financial management;Supervision due to cognitive status;Assist for transportation;Direct supervision/assist for medications management;Help with stairs or ramp for entrance   Can travel by private vehicle     Yes  Equipment Recommendations  None recommended by PT    Recommendations for Other Services       Precautions / Restrictions Precautions Precautions: Fall Recall of Precautions/Restrictions: Intact Restrictions Weight Bearing Restrictions Per Provider Order: No     Mobility  Bed Mobility               General bed mobility comments: in recliner pre and post session    Transfers Overall transfer level: Needs assistance Equipment used: Rolling Thaddius Manes (2 wheels) Transfers: Sit to/from Stand, Bed to chair/wheelchair/BSC Sit to Stand: Min assist   Step pivot transfers: Min assist       General transfer comment: stood from  recliner with minA and incontinent of BM. Transferred to [facility] with minA and RW. Stood from [facility] with minA. Required totalA for pericare    Ambulation/Gait               General Gait Details: patient refusing further mobility   Stairs             Wheelchair Mobility     Tilt Bed    Modified Rankin (Stroke Patients Only)       Balance Overall balance assessment: Needs assistance Sitting-balance support: No upper extremity supported, Feet supported Sitting balance-Leahy Scale: Good     Standing balance support: Bilateral upper extremity supported Standing balance-Leahy Scale: Poor Standing balance comment: heavy BUE support                            Communication Communication Communication: Impaired Factors Affecting Communication: Difficulty expressing self  Cognition Arousal: Alert Behavior During Therapy: Flat affect, Restless   PT - Cognitive impairments: History of cognitive impairments                       PT - Cognition Comments: A&Ox1 to person only; follows commands very inconsistently Following commands: Impaired Following commands impaired: Follows one step commands inconsistently, Follows one step commands with increased time    Cueing Cueing Techniques: Verbal cues, Tactile cues, Gestural cues  Exercises      General Comments        Pertinent Vitals/Pain Pain Assessment Pain Assessment: Faces Faces Pain Scale: No hurt Pain Intervention(s): Monitored during session    Home Living  Prior Function            PT Goals (current goals can now be found in the care plan section) Acute Rehab PT Goals PT Goal Formulation: Patient unable to participate in goal setting Time For Goal Achievement: 10/03/23 Progress towards PT goals: Progressing toward goals    Frequency    Min 2X/week      PT Plan      Co-evaluation              AM-PAC PT 6 Clicks Mobility    Outcome Measure  Help needed turning from your back to your side while in a flat bed without using bedrails?: A Little Help needed moving from lying on your back to sitting on the side of a flat bed without using bedrails?: A Little Help needed moving to and from a bed to a chair (including a wheelchair)?: A Little Help needed standing up from a chair using your arms (e.g., wheelchair or bedside chair)?: A Little Help needed to walk in hospital room?: A Lot Help needed climbing 3-5 steps with a railing? : Total 6 Click Score: 15    End of Session   Activity Tolerance: Patient tolerated treatment well Patient left: in chair;with call bell/phone within reach;with chair alarm set Nurse Communication: Mobility status PT Visit Diagnosis: Unsteadiness on feet (R26.81);Other abnormalities of gait and mobility (R26.89);Muscle weakness (generalized) (M62.81)     Time: 8597-8573 PT Time Calculation (min) (ACUTE ONLY): 24 min  Charges:    $Therapeutic Activity: 23-37 mins PT General Charges $$ ACUTE PT VISIT: 1 Visit                     Maryanne Finder, PT, DPT Physical Therapist - [facility]  [facility]   Jaki Hammerschmidt A Riham Polyakov 09/25/2023, 2:38 PM

## 2023-09-25 NOTE — Plan of Care (Signed)

## 2023-09-26 DIAGNOSIS — G9341 Metabolic encephalopathy: Secondary | ICD-10-CM | POA: Diagnosis not present

## 2023-09-26 LAB — GLUCOSE, CAPILLARY
Glucose-Capillary: 176 mg/dL — ABNORMAL HIGH (ref 70–99)
Glucose-Capillary: 176 mg/dL — ABNORMAL HIGH (ref 70–99)
Glucose-Capillary: 200 mg/dL — ABNORMAL HIGH (ref 70–99)
Glucose-Capillary: 170 mg/dL — ABNORMAL HIGH (ref 70–99)

## 2023-09-26 LAB — BASIC METABOLIC PANEL WITH GFR
Anion gap: 8 (ref 5–15)
BUN: 45 mg/dL — ABNORMAL HIGH (ref 8–23)
CO2: 25 mmol/L (ref 22–32)
Calcium: 8.9 mg/dL (ref 8.9–10.3)
Chloride: 102 mmol/L (ref 98–111)
Creatinine, Ser: 2.83 mg/dL — ABNORMAL HIGH (ref 0.61–1.24)
GFR, Estimated: 21 mL/min — ABNORMAL LOW (ref >60)
Glucose, Bld: 157 mg/dL — ABNORMAL HIGH (ref 70–99)
Potassium: 4.7 mmol/L (ref 3.5–5.1)
Sodium: 135 mmol/L (ref 135–145)

## 2023-09-26 MED ORDER — SODIUM CHLORIDE 0.9 % IV SOLN: INTRAVENOUS | Status: AC

## 2023-09-26 NOTE — Plan of Care (Signed)

## 2023-09-26 NOTE — Progress Notes (Signed)
 PT Cancellation Note  Patient Details Name: [name] MRN: [id number] DOB: November 18, 1933   Cancelled Treatment:    Reason Eval/Treat Not Completed: Patient declined, no reason specified. Pt received upright in recliner. Pt declining all forms of mobility/treatment efforts despite encouragement and variety of treatment options. Pt reports no current needs wanting to remain in recliner. PT to re-attempt per POC.   Dorina HERO. Fairly IV, PT, DPT Physical Therapist- [place]  [facility] 09/26/2023, 2:50 PM

## 2023-09-26 NOTE — TOC Progression Note (Signed)
 Transition of Care [facility]) - Progression Note    Patient Details  Name: [name] MRN: [id number] Date of Birth: October 24, 1933  Transition of Care [facility]) CM/SW Contact  Alvaro Louder, [place] Phone Number: 09/26/2023, 10:11 AM  Clinical Narrative:   ISRAEL Sermon to MD regarding medical readiness. Patient is not medically ready for DC. [facility]ara will expire today, [facility] are not taking admissions over the weekend. TOC to restart insurance Auth. Plan to admit patient Monday.   TOC to follow for discharge                      Expected Discharge Plan and Services                                               Social Drivers of Health (SDOH) Interventions SDOH Screenings   Food Insecurity: No Food Insecurity (09/18/2023)  Housing: Low Risk  (09/18/2023)  Transportation Needs: No Transportation Needs (09/18/2023)  Utilities: Patient Unable To Answer (09/18/2023)  Alcohol Screen: Low Risk  (10/26/2021)  Depression (PHQ2-9): Low Risk  (08/13/2023)  Financial Resource Strain: Low Risk  (07/28/2023)  Physical Activity: Inactive (10/29/2019)  Social Connections: Patient Unable To Answer (09/18/2023)  Stress: No Stress Concern Present (10/29/2019)  Recent Concern: Stress - Stress Concern Present (09/08/2019)  Tobacco Use: Medium Risk (09/18/2023)  Health Literacy: Inadequate Health Literacy (07/28/2023)    Readmission Risk Interventions    07/01/2023    4:26 PM 09/11/2022    1:23 PM  Readmission Risk Prevention Plan  Transportation Screening Complete Complete  Medication Review (RN Care Manager) Complete Referral to Pharmacy  PCP or Specialist appointment within 3-5 days of discharge Complete Complete  HRI or Home Care Consult Complete Complete  SW Recovery Care/Counseling Consult Complete Complete  Palliative Care Screening Not Applicable Not Applicable  Skilled Nursing Facility Not Applicable Not Applicable

## 2023-09-26 NOTE — Plan of Care (Signed)

## 2023-09-26 NOTE — Progress Notes (Signed)
 PROGRESS NOTE    [name]   FMW:[id number] DOB: 1933/05/08  DOA: 09/18/2023 Date of Service: 09/26/23 which is hospital day 8  PCP: Liana Fish, NP    Hospital course / significant events:   HPI: [name] is a 89 y.o. male with medical history significant of hypertension, hyperlipidemia, T2DM, permanent atrial fibrillation on edoxaban , SSS s/p PPM, HFrEF with EF of 45 to 50% -06/24/2023), V-Tach on amiodarone , stage IVb metastatic prostate cancer on Xtandi  and leuprolide  who presents to the emergency department from home via EMS due to 2-week onset of progressive worsening confusion.  Patient was unable to provide history, history was obtained from EDP, ED medical record and daughter-in-law at bedside.  Per report, patient lives at home with wife who has dementia and he was able to take care of his ADLs and care for his wife.  However, since past 2-3 weeks, family checked on him from time to time noted changes in cognition and several episodes of diarrhea.  It was also noted that he has not been taking his medications including amiodarone  as prescribed (he was taking more pills than he was supposed to), assumptions made based on number of pills in his pillbox.  His occupational therapist followed up with him and noted he was covered in stool and was more confused, so he requested for him to be taken to the ED for further evaluation and management.   Of note: was admitted from 6/2 to 6/6 due to monomorphic V. tach during which he underwent t LHC with Dr. Ammon (06/03) and revealed 40% stenosed Ost/prox LAD, mild LV systolic dysfunction. He was also admitted from 6/7 to 6/20 due to left MCA infarct with left M1 occlusion s/p mechanical thrombectomy with TICI3 revascularization, etiology likely A-fib on DOAC in the setting of recent cardioversion.   On this admission Encephalopathy likely multifactorial w/ recent stroke affecting mentation. MRI nothing acute, no anemia, no  electrolyte abnormalities, normal tsh, normal LFTs. Diarrhea/dehydration w/ AKI also might be contirbuting (+)Norovirus explains diarrhea  Hematochezia - appreciate GI input.  Patient had a flexible sigmoidoscopy which showed a rectal ulcer. Record review previously there was plan to stop anticoagulation but pt appears to have been taking this at home. Will dc this medication.  Renal function has continued to worsen, as of 09/26/23 trial fluids (low po intake) but if not improving will need to involve nephrology     Consultants:  Gastroenterology   Procedures/Surgeries: 09/23/23 flexible sigmoidoscopy       ASSESSMENT & PLAN:   Acute encephalopathy - improved/resolved  Likely multifactorial, recent stroke biggest contributor.  Continue supportive care   Diarrhea d/t norovirus - improved  Continue supportive care    Hematochezia d/t rectal ulcer Anticoagulation/antiplatelet is contributing factor  Edoxaban  had been discontinued - plan to restart on discharge per daughter's request, education provided re: risk/benefit and if has another GI bleed or other complication may need to hold this indefinitely    Debility Resides independently at home with wife who has dementia,  SNF rehab  Pt has been intermittently refusing PT/OT   AKI on CKD3a (baseline Cr 1.4-1.5 and GFR 45-48 in 06/2023)  Question progression to CKD3b (Cr here has been 1.8-2 and GFR 30-35) Secondary to GI losses from diarrhea, poor po intake  Encourage oral fluid intake Cr up again today (lasix  was ordered to restart but was never given) will continue to hold lasix , pt does not appear fluid overloaded so will cautiously give IV fluids  today to see if this improves renal fxn and if not will need to involve nephrology  Recheck BMP   Hx v-tach History of A-fib Has pacer Continue amiodarone  for rate control   T2DM Euglycemic Hold home farxiga  for now   PAD Non-obstructive CAD Continue home statin Resume  anticoag per daughter request/preference see above  HFrEF Last echo on file 06/24/2023 - LVEF of 45 to 50%, 2D echocardiogram which was done 06/24 Continue to hold home farxiga  w/ low BP holding lasix  for now d/t AKI/CKD, will diurese if clinically fluid up  holding metoprolol  d/t low BP Cautious fluids today given worsening renal function   Hx DVT Anticoagulant discontinued back in June per record review, daughter requests/prefers he is on this given CVA hx, see above    Metastatic prostate cancer Can resume home xtandi      COPD Quiescent/stable  incruse for home spiriva     overweight based on BMI: Body mass index is 27.34 kg/m.SABRA Significantly low or high BMI is associated with higher medical risk.  Underweight - under 18  overweight - 25 to 29 obese - 30 or more Class 1 obesity: BMI of 30.0 to 34 Class 2 obesity: BMI of 35.0 to 39 Class 3 obesity: BMI of 40.0 to 49 Super Morbid Obesity: BMI 50-59 Super-super Morbid Obesity: BMI 60+ Healthy nutrition and physical activity advised as adjunct to other disease management and risk reduction treatments   DVT prophylaxis: SCD IV fluids: no continuous IV fluids  Nutrition: cardiac/carb diet Central lines / other devices: none  Code Status: FULL CODE ACP documentation reviewed: HCPOA on file in [facility]  TOC needs: SNF placement will need re-do auth over the weekend  Medical barriers to dispo: renal function - if improving/stable can plan for dc             Subjective / Brief ROS:  Patient reports feeling well today no concern Denies CP/SOB.  Pain controlled.  Denies new weakness.    Family Communication: spoke on phone w/ daughter  in law / [place] Tiffany 09/26/23 3:07 PM     Objective Findings:  Vitals:   09/25/23 1805 09/25/23 2005 09/26/23 0450 09/26/23 0731  BP: 131/84 138/80 123/79 106/72  Pulse: 74 71 77   Resp: 17 18 20 17   Temp: 98.6 F (37 C) 98.9 F (37.2 C) 98.1 F (36.7 C) 98.2 F (36.8 C)   TempSrc:      SpO2: 100% 95% 100% 99%  Weight:      Height:        Intake/Output Summary (Last 24 hours) at 09/26/2023 1502 Last data filed at 09/26/2023 0900 Gross per 24 hour  Intake 480 ml  Output 900 ml  Net -420 ml   Filed Weights   09/18/23 1546  Weight: 94 kg    Examination:  Physical Exam Constitutional:      General: He is not in acute distress. Cardiovascular:     Rate and Rhythm: Normal rate and regular rhythm.  Pulmonary:     Effort: Pulmonary effort is normal.     Breath sounds: Normal breath sounds.  Abdominal:     General: Bowel sounds are normal.     Tenderness: There is no abdominal tenderness.  Skin:    General: Skin is warm and dry.  Neurological:     Mental Status: He is alert.  Psychiatric:        Mood and Affect: Mood normal.        Behavior: Behavior normal.  Scheduled Medications:   amiodarone   200 mg Oral Daily   atorvastatin   10 mg Oral Daily   edoxaban   30 mg Oral Daily   enzalutamide   160 mg Oral Daily   feeding supplement (GLUCERNA SHAKE)  237 mL Oral TID BM   insulin  aspart  0-9 Units Subcutaneous TID WC   pantoprazole   40 mg Oral Daily   polyethylene glycol  17 g Oral Daily   umeclidinium bromide   1 puff Inhalation Daily   verapamil   120 mg Oral QHS    Continuous Infusions:  sodium chloride  100 mL/hr at 09/26/23 0938    PRN Medications:  acetaminophen  **OR** acetaminophen , prochlorperazine   Antimicrobials from admission:  Anti-infectives (From admission, onward)    None           Data Reviewed:  I have personally reviewed the following...  CBC: Recent Labs  Lab 09/20/23 0922 09/20/23 1815 09/21/23 0622 09/22/23 1017 09/24/23 0211 09/25/23 0517  WBC 5.2 5.6 5.5 5.6 6.0 6.1  NEUTROABS 3.2  --   --   --   --   --   HGB 14.1 14.0 14.4 13.5 14.1 14.8  HCT 43.3 44.2 44.4 41.7 43.2 46.0  MCV 94.7 96.3 95.3 95.0 94.5 94.1  PLT 159 160 174 188 216 222   Basic Metabolic Panel: Recent Labs  Lab  09/22/23 1017 09/22/23 1729 09/24/23 0211 09/25/23 0517 09/26/23 0459  NA 137 136 138 138 135  K 4.4 4.7 4.8 4.6 4.7  CL 104 102 102 103 102  CO2 23 26 23 25 25   GLUCOSE 246* 129* 157* 161* 157*  BUN 38* 41* 35* 32* 45*  CREATININE 1.99* 2.09* 1.88* 2.05* 2.83*  CALCIUM  8.9 9.1 9.1 9.0 8.9  MG  --  2.1  --   --   --    GFR: Estimated Creatinine Clearance: 20.4 mL/min (A) (by C-G formula based on SCr of 2.83 mg/dL (H)). Liver Function Tests: No results for input(s): AST, ALT, ALKPHOS, BILITOT, PROT, ALBUMIN  in the last 168 hours.  No results for input(s): LIPASE, AMYLASE in the last 168 hours. No results for input(s): AMMONIA in the last 168 hours. Coagulation Profile: No results for input(s): INR, PROTIME in the last 168 hours.  Cardiac Enzymes: No results for input(s): CKTOTAL, CKMB, CKMBINDEX, TROPONINI in the last 168 hours. BNP (last 3 results) No results for input(s): PROBNP in the last 8760 hours. HbA1C: No results for input(s): HGBA1C in the last 72 hours. CBG: Recent Labs  Lab 09/25/23 1145 09/25/23 1720 09/25/23 2136 09/26/23 0819 09/26/23 1150  GLUCAP 215* 125* 143* 176* 176*   Lipid Profile: No results for input(s): CHOL, HDL, LDLCALC, TRIG, CHOLHDL, LDLDIRECT in the last 72 hours. Thyroid  Function Tests: No results for input(s): TSH, T4TOTAL, FREET4, T3FREE, THYROIDAB in the last 72 hours. Anemia Panel: No results for input(s): VITAMINB12, FOLATE, FERRITIN, TIBC, IRON, RETICCTPCT in the last 72 hours. Most Recent Urinalysis On File:     Component Value Date/Time   COLORURINE YELLOW (A) 09/18/2023 1554   APPEARANCEUR CLEAR (A) 09/18/2023 1554   APPEARANCEUR Clear 12/20/2021 1615   LABSPEC 1.013 09/18/2023 1554   LABSPEC 1.015 10/12/2013 1445   PHURINE 5.0 09/18/2023 1554   GLUCOSEU >=500 (A) 09/18/2023 1554   GLUCOSEU 50 mg/dL 90/77/7984 8554   HGBUR MODERATE (A) 09/18/2023 1554    BILIRUBINUR NEGATIVE 09/18/2023 1554   BILIRUBINUR Negative 12/20/2021 1615   BILIRUBINUR Negative 10/12/2013 1445   KETONESUR NEGATIVE 09/18/2023 1554   PROTEINUR NEGATIVE  09/18/2023 1554   UROBILINOGEN 0.2 06/29/2019 1556   NITRITE NEGATIVE 09/18/2023 1554   LEUKOCYTESUR NEGATIVE 09/18/2023 1554   LEUKOCYTESUR Negative 10/12/2013 1445   Sepsis Labs: @LABRCNTIP (procalcitonin:4,lacticidven:4) Microbiology: Recent Results (from the past 240 hours)  Urine Culture (for pregnant, neutropenic or urologic patients or patients with an indwelling urinary catheter)     Status: None   Collection Time: 09/18/23  3:54 PM   Specimen: Urine, Clean Catch  Result Value Ref Range Status   Specimen Description   Final    URINE, CLEAN CATCH Performed at [facility], [address] [place], [place] [address]    Special Requests   Final    NONE Performed at [facility], [address], [place], [place] [address]    Culture   Final    NO GROWTH Performed at [facility], [address], [place], [place] [address]    Report Status 09/20/2023 FINAL  Final  Culture, blood (Routine X 2) w Reflex to ID Panel     Status: None   Collection Time: 09/19/23  2:48 PM   Specimen: BLOOD  Result Value Ref Range Status   Specimen Description   Final    BLOOD BLOOD LEFT ARM Performed at [facility], [address], [place], [place] [address]    Special Requests   Final    BOTTLES DRAWN AEROBIC AND ANAEROBIC Blood Culture adequate volume Performed at [facility], [address], [place], [place] [address]    Culture   Final    NO GROWTH 5 DAYS Performed at [facility], [address] [place], [place] [address]    Report Status 09/24/2023 FINAL  Final  Culture, blood (Routine X 2) w Reflex to ID Panel     Status: None   Collection Time: 09/19/23  2:48 PM   Specimen: BLOOD  Result Value Ref Range Status   Specimen Description   Final    BLOOD  BLOOD LEFT HAND Performed at [facility], [address], [place], [place] [address]    Special Requests   Final    BOTTLES DRAWN AEROBIC AND ANAEROBIC Blood Culture adequate volume Performed at [facility], [address], [place], [place] [address]    Culture   Final    NO GROWTH 5 DAYS Performed at [facility], [address], [place], [place] [address]    Report Status 09/24/2023 FINAL  Final  Resp panel by RT-PCR (RSV, Flu A&B, Covid) Anterior Nasal Swab     Status: None   Collection Time: 09/19/23  4:27 PM   Specimen: Anterior Nasal Swab  Result Value Ref Range Status   SARS Coronavirus 2 by RT PCR NEGATIVE NEGATIVE Final    Comment: (NOTE) SARS-CoV-2 target nucleic acids are NOT DETECTED.  The SARS-CoV-2 RNA is generally detectable in upper respiratory specimens during the acute phase of infection. The lowest concentration of SARS-CoV-2 viral copies this assay can detect is 138 copies/mL. A negative result does not preclude SARS-Cov-2 infection and should not be used as the sole basis for treatment or other patient management decisions. A negative result may occur with  improper specimen collection/handling, submission of specimen other than nasopharyngeal swab, presence of viral mutation(s) within the areas targeted by this assay, and inadequate number of viral copies(<138 copies/mL). A negative result must be combined with clinical observations, patient history, and epidemiological information. The expected result is Negative.  Fact Sheet for Patients:  BloggerCourse.com  Fact Sheet for Healthcare Providers:  SeriousBroker.it  This test is no t yet approved or cleared by the [place]  FDA and  has been authorized for detection and/or diagnosis of SARS-CoV-2 by FDA under an Emergency Use Authorization (EUA). This EUA will remain  in effect (meaning this test can be used) for the  duration of the COVID-19 declaration under Section 564(b)(1) of the Act, 21 U.S.C.section 360bbb-3(b)(1), unless the authorization is terminated  or revoked sooner.       Influenza A by PCR NEGATIVE NEGATIVE Final   Influenza B by PCR NEGATIVE NEGATIVE Final    Comment: (NOTE) The Xpert Xpress SARS-CoV-2/FLU/RSV plus assay is intended as an aid in the diagnosis of influenza from Nasopharyngeal swab specimens and should not be used as a sole basis for treatment. Nasal washings and aspirates are unacceptable for Xpert Xpress SARS-CoV-2/FLU/RSV testing.  Fact Sheet for Patients: BloggerCourse.com  Fact Sheet for Healthcare Providers: SeriousBroker.it  This test is not yet approved or cleared by the [place]  FDA and has been authorized for detection and/or diagnosis of SARS-CoV-2 by FDA under an Emergency Use Authorization (EUA). This EUA will remain in effect (meaning this test can be used) for the duration of the COVID-19 declaration under Section 564(b)(1) of the Act, 21 U.S.C. section 360bbb-3(b)(1), unless the authorization is terminated or revoked.     Resp Syncytial Virus by PCR NEGATIVE NEGATIVE Final    Comment: (NOTE) Fact Sheet for Patients: BloggerCourse.com  Fact Sheet for Healthcare Providers: SeriousBroker.it  This test is not yet approved or cleared by the [place]  FDA and has been authorized for detection and/or diagnosis of SARS-CoV-2 by FDA under an Emergency Use Authorization (EUA). This EUA will remain in effect (meaning this test can be used) for the duration of the COVID-19 declaration under Section 564(b)(1) of the Act, 21 U.S.C. section 360bbb-3(b)(1), unless the authorization is terminated or revoked.  Performed at [facility], [address] [place], [place] [address]   Gastrointestinal Panel by PCR , Stool     Status:  Abnormal   Collection Time: 09/19/23  8:00 PM   Specimen: STOOL  Result Value Ref Range Status   Campylobacter species NOT DETECTED NOT DETECTED Final   Plesimonas shigelloides NOT DETECTED NOT DETECTED Final   Salmonella species NOT DETECTED NOT DETECTED Final   Yersinia enterocolitica NOT DETECTED NOT DETECTED Final   Vibrio species NOT DETECTED NOT DETECTED Final   Vibrio cholerae NOT DETECTED NOT DETECTED Final   Enteroaggregative E coli (EAEC) NOT DETECTED NOT DETECTED Final   Enteropathogenic E coli (EPEC) NOT DETECTED NOT DETECTED Final   Enterotoxigenic E coli (ETEC) NOT DETECTED NOT DETECTED Final   Shiga like toxin producing E coli (STEC) NOT DETECTED NOT DETECTED Final   Shigella/Enteroinvasive E coli (EIEC) NOT DETECTED NOT DETECTED Final   Cryptosporidium NOT DETECTED NOT DETECTED Final   Cyclospora cayetanensis NOT DETECTED NOT DETECTED Final   Entamoeba histolytica NOT DETECTED NOT DETECTED Final   Giardia lamblia NOT DETECTED NOT DETECTED Final   Adenovirus F40/41 NOT DETECTED NOT DETECTED Final   Astrovirus NOT DETECTED NOT DETECTED Final   Norovirus GI/GII DETECTED (A) NOT DETECTED Final    Comment: RESULT CALLED TO, READ BACK BY AND VERIFIED WITH: JOYYA BAYNUM, LPN @0437  09/20/2023 COP    Rotavirus A NOT DETECTED NOT DETECTED Final   Sapovirus (I, II, IV, and V) NOT DETECTED NOT DETECTED Final    Comment: Performed at [facility], [address] [place], [place] [address]  C Difficile Quick Screen w PCR reflex     Status: None   Collection Time: 09/19/23  8:00 PM   Specimen: STOOL  Result Value Ref Range Status   C Diff antigen NEGATIVE NEGATIVE Final   C Diff toxin NEGATIVE NEGATIVE Final   C Diff interpretation No C. difficile detected.  Final    Comment: Performed at [facility], [address] [place], [place] [address]      Radiology Studies last 3 days: No results found.       Rashid Whitenight, DO [facility] 09/26/2023, 3:02 PM    Dictation software may have been used to generate the above note. Typos may occur and escape review in typed/dictated notes. Please contact Dr Marsa directly for clarity if needed.  Staff may message me via secure chat in Epic  but this may not receive an immediate response,  please page me for urgent matters!  If 7PM-7AM, please contact night coverage [email or web address]

## 2023-09-27 ENCOUNTER — Inpatient Hospital Stay

## 2023-09-27 DIAGNOSIS — G9341 Metabolic encephalopathy: Secondary | ICD-10-CM | POA: Diagnosis not present

## 2023-09-27 LAB — BASIC METABOLIC PANEL WITH GFR
Anion gap: 9 (ref 5–15)
BUN: 48 mg/dL — ABNORMAL HIGH (ref 8–23)
CO2: 22 mmol/L (ref 22–32)
Calcium: 8.7 mg/dL — ABNORMAL LOW (ref 8.9–10.3)
Chloride: 105 mmol/L (ref 98–111)
Creatinine, Ser: 2.92 mg/dL — ABNORMAL HIGH (ref 0.61–1.24)
GFR, Estimated: 20 mL/min — ABNORMAL LOW (ref >60)
Glucose, Bld: 129 mg/dL — ABNORMAL HIGH (ref 70–99)
Potassium: 4.7 mmol/L (ref 3.5–5.1)
Sodium: 136 mmol/L (ref 135–145)

## 2023-09-27 LAB — MAGNESIUM: Magnesium: 2.3 mg/dL (ref 1.7–2.4)

## 2023-09-27 LAB — GLUCOSE, CAPILLARY
Glucose-Capillary: 144 mg/dL — ABNORMAL HIGH (ref 70–99)
Glucose-Capillary: 160 mg/dL — ABNORMAL HIGH (ref 70–99)
Glucose-Capillary: 114 mg/dL — ABNORMAL HIGH (ref 70–99)
Glucose-Capillary: 155 mg/dL — ABNORMAL HIGH (ref 70–99)

## 2023-09-27 NOTE — Progress Notes (Signed)
 [facility]  ROUNDING NOTE   Subjective:   [name] is a 89 y.o. male with past medical history of atrial fibrillation, hypertension, aortic atherosclerosis, COPD, obstructive sleep apnea, CKD, osteoarthritis, diabetes, prostate cancer treated with Xtandi .  Patient presents to the emergency department for evaluation of left abdominal pain.  Patient has been admitted for Acute renal insufficiency [N28.9] Acute encephalopathy [G93.40] Skin breakdown [R23.8] Non compliance w medication regimen [Z91.148] Acute metabolic encephalopathy [G93.41]  Patient is known to our practice followed outpatient by Dr. Dennise.    Patient seen laying in bed No family present Oriented to name only  Creatinine 2.92   Objective:  Vital signs in last 24 hours:  Temp:  [97.8 F (36.6 C)-98.1 F (36.7 C)] 97.9 F (36.6 C) (09/06 0751) Pulse Rate:  [73-84] 84 (09/06 0751) Resp:  [16-17] 16 (09/06 0751) BP: (109-140)/(71-90) 121/82 (09/06 0751) SpO2:  [95 %-99 %] 97 % (09/06 0751)  Weight change:  Filed Weights   09/18/23 1546  Weight: 94 kg    Intake/Output: I/O last 3 completed shifts: In: 801.8 [P.O.:240; I.V.:561.8] Out: 2000 [Urine:2000]   Intake/Output this shift:  No intake/output data recorded.  Physical Exam: General: NAD  Head: Normocephalic, atraumatic. Moist oral mucosal membranes  Eyes: Anicteric  Lungs:  Clear to auscultation. Normal effort  Heart: Regular rate and rhythm  Abdomen:  Soft, nontender  Extremities:  No peripheral edema.  Neurologic: Awake, alert, confused  Skin: Warm,dry, no rash       Basic Metabolic Panel: Recent Labs  Lab 09/22/23 1729 09/24/23 0211 09/25/23 0517 09/26/23 0459 09/27/23 0523  NA 136 138 138 135 136  K 4.7 4.8 4.6 4.7 4.7  CL 102 102 103 102 105  CO2 26 23 25 25 22   GLUCOSE 129* 157* 161* 157* 129*  BUN 41* 35* 32* 45* 48*  CREATININE 2.09* 1.88* 2.05* 2.83* 2.92*  CALCIUM  9.1 9.1 9.0 8.9 8.7*  MG 2.1  --    --   --   --     Liver Function Tests: No results for input(s): AST, ALT, ALKPHOS, BILITOT, PROT, ALBUMIN  in the last 168 hours.  No results for input(s): LIPASE, AMYLASE in the last 168 hours. No results for input(s): AMMONIA in the last 168 hours.  CBC: Recent Labs  Lab 09/20/23 1815 09/21/23 0622 09/22/23 1017 09/24/23 0211 09/25/23 0517  WBC 5.6 5.5 5.6 6.0 6.1  HGB 14.0 14.4 13.5 14.1 14.8  HCT 44.2 44.4 41.7 43.2 46.0  MCV 96.3 95.3 95.0 94.5 94.1  PLT 160 174 188 216 222    Cardiac Enzymes: No results for input(s): CKTOTAL, CKMB, CKMBINDEX, TROPONINI in the last 168 hours.  BNP: Invalid input(s): POCBNP  CBG: Recent Labs  Lab 09/26/23 0819 09/26/23 1150 09/26/23 1823 09/26/23 2240 09/27/23 0748  GLUCAP 176* 176* 200* 170* 144*    Microbiology: Results for orders placed or performed during the hospital encounter of 09/18/23  Urine Culture (for pregnant, neutropenic or urologic patients or patients with an indwelling urinary catheter)     Status: None   Collection Time: 09/18/23  3:54 PM   Specimen: Urine, Clean Catch  Result Value Ref Range Status   Specimen Description   Final    URINE, CLEAN CATCH Performed at [facility], [address] [place], [place] [address]    Special Requests   Final    NONE Performed at [facility], [address] [place], [place] [address]    Culture  Final    NO GROWTH Performed at [facility], [address] [place], [place] [address]    Report Status 09/20/2023 FINAL  Final  Culture, blood (Routine X 2) w Reflex to ID Panel     Status: None   Collection Time: 09/19/23  2:48 PM   Specimen: BLOOD  Result Value Ref Range Status   Specimen Description   Final    BLOOD BLOOD LEFT ARM Performed at [facility] [address] [place], [place] [address]    Special Requests   Final    BOTTLES DRAWN AEROBIC AND ANAEROBIC Blood Culture adequate  volume Performed at [facility], [address] [place], [place] [address]    Culture   Final    NO GROWTH 5 DAYS Performed at [facility], [address] [place], [place] [address]    Report Status 09/24/2023 FINAL  Final  Culture, blood (Routine X 2) w Reflex to ID Panel     Status: None   Collection Time: 09/19/23  2:48 PM   Specimen: BLOOD  Result Value Ref Range Status   Specimen Description   Final    BLOOD BLOOD LEFT HAND Performed at [facility], [address] [place], [place] [address]    Special Requests   Final    BOTTLES DRAWN AEROBIC AND ANAEROBIC Blood Culture adequate volume Performed at [facility], [address], [place], [place] [address]    Culture   Final    NO GROWTH 5 DAYS Performed at [facility], [address], [place], [place] [address]    Report Status 09/24/2023 FINAL  Final  Resp panel by RT-PCR (RSV, Flu A&B, Covid) Anterior Nasal Swab     Status: None   Collection Time: 09/19/23  4:27 PM   Specimen: Anterior Nasal Swab  Result Value Ref Range Status   SARS Coronavirus 2 by RT PCR NEGATIVE NEGATIVE Final    Comment: (NOTE) SARS-CoV-2 target nucleic acids are NOT DETECTED.  The SARS-CoV-2 RNA is generally detectable in upper respiratory specimens during the acute phase of infection. The lowest concentration of SARS-CoV-2 viral copies this assay can detect is 138 copies/mL. A negative result does not preclude SARS-Cov-2 infection and should not be used as the sole basis for treatment or other patient management decisions. A negative result may occur with  improper specimen collection/handling, submission of specimen other than nasopharyngeal swab, presence of viral mutation(s) within the areas targeted by this assay, and inadequate number of viral copies(<138 copies/mL). A negative result must be combined with clinical observations, patient history, and epidemiological information. The expected  result is Negative.  Fact Sheet for Patients:  BloggerCourse.com  Fact Sheet for Healthcare Providers:  SeriousBroker.it  This test is no t yet approved or cleared by the [place]  FDA and  has been authorized for detection and/or diagnosis of SARS-CoV-2 by FDA under an Emergency Use Authorization (EUA). This EUA will remain  in effect (meaning this test can be used) for the duration of the COVID-19 declaration under Section 564(b)(1) of the Act, 21 U.S.C.section 360bbb-3(b)(1), unless the authorization is terminated  or revoked sooner.       Influenza A by PCR NEGATIVE NEGATIVE Final   Influenza B by PCR NEGATIVE NEGATIVE Final    Comment: (NOTE) The Xpert Xpress SARS-CoV-2/FLU/RSV plus assay is intended as an aid in the diagnosis of influenza from Nasopharyngeal swab specimens and should not be used as a sole  basis for treatment. Nasal washings and aspirates are unacceptable for Xpert Xpress SARS-CoV-2/FLU/RSV testing.  Fact Sheet for Patients: BloggerCourse.com  Fact Sheet for Healthcare Providers: SeriousBroker.it  This test is not yet approved or cleared by the [place]  FDA and has been authorized for detection and/or diagnosis of SARS-CoV-2 by FDA under an Emergency Use Authorization (EUA). This EUA will remain in effect (meaning this test can be used) for the duration of the COVID-19 declaration under Section 564(b)(1) of the Act, 21 U.S.C. section 360bbb-3(b)(1), unless the authorization is terminated or revoked.     Resp Syncytial Virus by PCR NEGATIVE NEGATIVE Final    Comment: (NOTE) Fact Sheet for Patients: BloggerCourse.com  Fact Sheet for Healthcare Providers: SeriousBroker.it  This test is not yet approved or cleared by the [place]  FDA and has been authorized for detection and/or diagnosis of  SARS-CoV-2 by FDA under an Emergency Use Authorization (EUA). This EUA will remain in effect (meaning this test can be used) for the duration of the COVID-19 declaration under Section 564(b)(1) of the Act, 21 U.S.C. section 360bbb-3(b)(1), unless the authorization is terminated or revoked.  Performed at [facility], [address] [place], [place] [address]   Gastrointestinal Panel by PCR , Stool     Status: Abnormal   Collection Time: 09/19/23  8:00 PM   Specimen: STOOL  Result Value Ref Range Status   Campylobacter species NOT DETECTED NOT DETECTED Final   Plesimonas shigelloides NOT DETECTED NOT DETECTED Final   Salmonella species NOT DETECTED NOT DETECTED Final   Yersinia enterocolitica NOT DETECTED NOT DETECTED Final   Vibrio species NOT DETECTED NOT DETECTED Final   Vibrio cholerae NOT DETECTED NOT DETECTED Final   Enteroaggregative E coli (EAEC) NOT DETECTED NOT DETECTED Final   Enteropathogenic E coli (EPEC) NOT DETECTED NOT DETECTED Final   Enterotoxigenic E coli (ETEC) NOT DETECTED NOT DETECTED Final   Shiga like toxin producing E coli (STEC) NOT DETECTED NOT DETECTED Final   Shigella/Enteroinvasive E coli (EIEC) NOT DETECTED NOT DETECTED Final   Cryptosporidium NOT DETECTED NOT DETECTED Final   Cyclospora cayetanensis NOT DETECTED NOT DETECTED Final   Entamoeba histolytica NOT DETECTED NOT DETECTED Final   Giardia lamblia NOT DETECTED NOT DETECTED Final   Adenovirus F40/41 NOT DETECTED NOT DETECTED Final   Astrovirus NOT DETECTED NOT DETECTED Final   Norovirus GI/GII DETECTED (A) NOT DETECTED Final    Comment: RESULT CALLED TO, READ BACK BY AND VERIFIED WITH: JOYYA BAYNUM, LPN @0437  09/20/2023 COP    Rotavirus A NOT DETECTED NOT DETECTED Final   Sapovirus (I, II, IV, and V) NOT DETECTED NOT DETECTED Final    Comment: Performed at [facility], [address] [place], [place] [address]  C Difficile Quick Screen w PCR reflex     Status: None    Collection Time: 09/19/23  8:00 PM   Specimen: STOOL  Result Value Ref Range Status   C Diff antigen NEGATIVE NEGATIVE Final   C Diff toxin NEGATIVE NEGATIVE Final   C Diff interpretation No C. difficile detected.  Final    Comment: Performed at [facility], [address] [place], [place] [address]   *Note: Due to a large number of results and/or encounters for the requested time period, some results have not been displayed. A complete set of results can be found in Results Review.    Coagulation Studies: No results for input(s): LABPROT, INR in the last 72 hours.  Urinalysis: No results for  input(s): COLORURINE, LABSPEC, PHURINE, GLUCOSEU, HGBUR, BILIRUBINUR, KETONESUR, PROTEINUR, UROBILINOGEN, NITRITE, LEUKOCYTESUR in the last 72 hours.  Invalid input(s): APPERANCEUR     Imaging: No results found.    Medications:      amiodarone   200 mg Oral Daily   atorvastatin   10 mg Oral Daily   edoxaban   30 mg Oral Daily   enzalutamide   160 mg Oral Daily   feeding supplement (GLUCERNA SHAKE)  237 mL Oral TID BM   insulin  aspart  0-9 Units Subcutaneous TID WC   pantoprazole   40 mg Oral Daily   polyethylene glycol  17 g Oral Daily   umeclidinium bromide   1 puff Inhalation Daily   verapamil   120 mg Oral QHS   acetaminophen  **OR** acetaminophen , prochlorperazine   Assessment/ Plan:  Mr. HENRI BAUMLER is a 89 y.o.  male with past medical history of atrial fibrillation, hypertension, aortic atherosclerosis, COPD, obstructive sleep apnea, CKD, osteoarthritis, diabetes, prostate cancer treated with Xtandi .  Patient presents to the emergency department for evaluation of left abdominal pain.  Patient has been admitted for Acute renal insufficiency [N28.9] Acute encephalopathy [G93.40] Skin breakdown [R23.8] Non compliance w medication regimen [Z91.148] Acute metabolic encephalopathy [G93.41]   Acute Kidney Injury on chronic kidney disease  stage IIIa with baseline creatinine 1.40 and GFR of 48 on 07/06/23.  Acute kidney injury secondary poor oral intake from confusion. Chronic kidney disease is secondary to hypertension and diabetes CT abd pelvis shows bilateral kidney stones without obstruction. No recent IV contrast exposure. Has received IV fluids during this admission.  Creatinine has increased, during this admission. No hypotension or contrast exposure. Continues to have good urine output. No acute indication for dialysis. Patient will need to follow up with Dr Dennise at discharge.   Lab Results  Component Value Date   CREATININE 2.92 (H) 09/27/2023   CREATININE 2.83 (H) 09/26/2023   CREATININE 2.05 (H) 09/25/2023    Intake/Output Summary (Last 24 hours) at 09/27/2023 1013 Last data filed at 09/26/2023 2100 Gross per 24 hour  Intake 561.79 ml  Output 1100 ml  Net -538.21 ml   2. Anemia of chronic kidney disease Lab Results  Component Value Date   HGB 14.8 09/25/2023    Hgb acceptable for this patient. No need of ESA during this time.   3. Hypertension with chronic kidney disease. Home regimen includes metoprolol  and verapamil . These have been resumed during this admission. Blood pressure stable, 121/82  4. Secondary Hyperparathyroidism: with outpatient labs: PTH 1.2, phosphorus 3.5, calcium  9.9 on 02/26/23.   Lab Results  Component Value Date   CALCIUM  8.7 (L) 09/27/2023   CAION 1.22 06/28/2023   PHOS 3.0 09/19/2023    Will continue to monitor bone minerals.    LOS: 9 Janellie Tennison 9/6/202510:13 AM

## 2023-09-27 NOTE — Progress Notes (Deleted)
 Mobility Specialist - Progress Note     09/27/23 1500  Mobility  Activity Ambulated with assistance;Stood at bedside;Dangled on edge of bed;Respositioned in chair  Level of Assistance Contact guard assist, steadying assist  Assistive Device Front wheel walker  Distance Ambulated (ft) 4 ft  Range of Motion/Exercises Active  Activity Response Tolerated well  Mobility Referral Yes  Mobility visit 1 Mobility  Mobility Specialist Start Time (ACUTE ONLY) 1457  Mobility Specialist Stop Time (ACUTE ONLY) 1512  Mobility Specialist Time Calculation (min) (ACUTE ONLY) 15 min   Pt resting EOB upon entry on RA. Pt STS and ambulates to hallway around NS CGA with RW. Highest ambulating HR 140. Pt endorsed feeling lightheaded during previous session CGA for safety. Pt returned to bed and left with needs in reach.   Guido Rumble Mobility Specialist 09/27/23, 3:50 PM

## 2023-09-27 NOTE — Plan of Care (Signed)

## 2023-09-27 NOTE — Progress Notes (Signed)
 Mobility Specialist - Progress Note    09/27/23 1500  Mobility  Activity Ambulated with assistance;Stood at bedside;Dangled on edge of bed;Respositioned in chair  Level of Assistance Contact guard assist, steadying assist  Assistive Device Front wheel walker  Distance Ambulated (ft) 4 ft  Range of Motion/Exercises Active  Activity Response Tolerated well  Mobility Referral Yes  Mobility visit 1 Mobility  Mobility Specialist Start Time (ACUTE ONLY) 1453  Mobility Specialist Stop Time (ACUTE ONLY) 1512  Mobility Specialist Time Calculation (min) (ACUTE ONLY) 19 min   Pt resting in recliner on RA upon entry. Pt STS x4 and take steps forward and backward in room CGA with RW. Pt stood for several minutes during hygiene clean. Pt returned to recliner and left with needs in reach. Chair alarm activated. Pt pleasant and more vocal throughout session.   Guido Rumble Mobility Specialist 09/27/23, 3:32 PM

## 2023-09-27 NOTE — Progress Notes (Signed)
 PROGRESS NOTE    [name]   FMW:[id number] DOB: 1933/07/04  DOA: 09/18/2023 Date of Service: 09/27/23 which is hospital day 9  PCP: Liana Fish, NP    Hospital course / significant events:   HPI: [name] is a 89 y.o. male with medical history significant of hypertension, hyperlipidemia, T2DM, permanent atrial fibrillation on edoxaban , SSS s/p PPM, HFrEF with EF of 45 to 50% -06/24/2023), V-Tach on amiodarone , stage IVb metastatic prostate cancer on Xtandi  and leuprolide  who presents to the emergency department from home via EMS due to 2-week onset of progressive worsening confusion.  Patient was unable to provide history, history was obtained from EDP, ED medical record and daughter-in-law at bedside.  Per report, patient lives at home with wife who has dementia and he was able to take care of his ADLs and care for his wife.  However, since past 2-3 weeks, family checked on him from time to time noted changes in cognition and several episodes of diarrhea.  It was also noted that he has not been taking his medications including amiodarone  as prescribed (he was taking more pills than he was supposed to), assumptions made based on number of pills in his pillbox.  His occupational therapist followed up with him and noted he was covered in stool and was more confused, so he requested for him to be taken to the ED for further evaluation and management.   Of note: was admitted from 6/2 to 6/6 due to monomorphic V. tach during which he underwent t LHC with Dr. Ammon (06/03) and revealed 40% stenosed Ost/prox LAD, mild LV systolic dysfunction. He was also admitted from 6/7 to 6/20 due to left MCA infarct with left M1 occlusion s/p mechanical thrombectomy with TICI3 revascularization, etiology likely A-fib on DOAC in the setting of recent cardioversion.   On this admission Encephalopathy likely multifactorial w/ recent stroke affecting mentation. MRI nothing acute, no anemia, no  electrolyte abnormalities, normal tsh, normal LFTs. Diarrhea/dehydration w/ AKI also might be contirbuting (+)Norovirus explains diarrhea  Hematochezia - appreciate GI input.  Patient had a flexible sigmoidoscopy which showed a rectal ulcer. Record review previously there was plan to stop anticoagulation but pt appears to have been taking this at home. Will dc this medication.  Renal function has continued to worsen, 09/26/23 trial fluids (low po intake) still worsening, not improving --> renal US , repeat UA, get postvoid residual, nephro consult - suspect advancing CKD? Per nephrology, ok to follow on discharge     Consultants:  Gastroenterology   Procedures/Surgeries: 09/23/23 flexible sigmoidoscopy       ASSESSMENT & PLAN:   Acute encephalopathy - improved/resolved  Likely multifactorial, recent stroke biggest contributor.  Continue supportive care   Diarrhea d/t norovirus - improved  Continue supportive care    Hematochezia d/t rectal ulcer Anticoagulation/antiplatelet is contributing factor  Edoxaban  had been discontinued - plan to restart on discharge per daughter's request, education provided re: risk/benefit and if has another GI bleed or other complication may need to hold this indefinitely    Debility Resides independently at home with wife who has dementia,  SNF rehab  Pt has been intermittently refusing PT/OT   AKI on CKD3b (baseline Cr 1.4-1.5 and GFR 45-48 in 06/2023 though previous hx noted CKD3b per notes)  Question progression to CKD3b (Cr here has been 1.8-2 and GFR 30-35 --> Cr 2.9 and GFR 20 today 09/27/23) Secondary to GI losses from diarrhea, poor po intake vs possible advancing CKD Repeat UA +  micro Calculate FeNa Renal US  Postvoid residual  monitor BMP Nephrology consult - await results but should be ok to follow outpatient and no concern for discharge    Hx v-tach History of A-fib Has pacer Continue amiodarone  for rate control    T2DM Euglycemic Hold home farxiga  for now - can restart if ok w/ nephrology SSI ac for now, of note pt will not do ok w/ taking insulin  / injectables at home per his POA   PAD Non-obstructive CAD Continue home statin Resume anticoag per daughter request/preference see above  HFrEF Last echo on file 06/24/2023 - LVEF of 45 to 50%, 2D echocardiogram which was done 06/24 Continue to hold home farxiga  w/ low BP holding lasix  for now d/t AKI/CKD, will diurese if clinically fluid up  holding metoprolol  d/t low BP Cautious fluids today given worsening renal function   Hx DVT Anticoagulant discontinued back in June per record review, daughter requests/prefers he is on this given CVA hx, see above    Metastatic prostate cancer Can resume home xtandi      COPD Quiescent/stable  incruse for home spiriva     overweight based on BMI: Body mass index is 27.34 kg/m.SABRA Significantly low or high BMI is associated with higher medical risk.  Underweight - under 18  overweight - 25 to 29 obese - 30 or more Class 1 obesity: BMI of 30.0 to 34 Class 2 obesity: BMI of 35.0 to 39 Class 3 obesity: BMI of 40.0 to 49 Super Morbid Obesity: BMI 50-59 Super-super Morbid Obesity: BMI 60+ Healthy nutrition and physical activity advised as adjunct to other disease management and risk reduction treatments   DVT prophylaxis: SCD IV fluids: no continuous IV fluids  Nutrition: cardiac/carb diet Central lines / other devices: none  Code Status: FULL CODE ACP documentation reviewed: HCPOA on file in [facility]  TOC needs: SNF placement will need re-do auth over the weekend  Medical barriers to dispo: renal function - if improving/stable can plan for dc             Subjective / Brief ROS:  Patient reports feeling well today no concern Denies CP/SOB.  Pain controlled.  Denies new weakness.    Family Communication: spoke on phone w/ daughter  in law / [place] Tiffany 09/27/23 3:38 PM      Objective Findings:  Vitals:   09/26/23 2202 09/26/23 2341 09/27/23 0355 09/27/23 0751  BP: (!) 140/90 109/71 120/74 121/82  Pulse:  78 73 84  Resp:  17 17 16   Temp:  98.1 F (36.7 C) 98.1 F (36.7 C) 97.9 F (36.6 C)  TempSrc:    Oral  SpO2:  99% 98% 97%  Weight:      Height:        Intake/Output Summary (Last 24 hours) at 09/27/2023 1538 Last data filed at 09/27/2023 1300 Gross per 24 hour  Intake 480 ml  Output 1100 ml  Net -620 ml   Filed Weights   09/18/23 1546  Weight: 94 kg    Examination:  Physical Exam Constitutional:      General: He is not in acute distress. Cardiovascular:     Rate and Rhythm: Normal rate and regular rhythm.  Pulmonary:     Effort: Pulmonary effort is normal.     Breath sounds: Normal breath sounds.  Abdominal:     General: Bowel sounds are normal.     Tenderness: There is no abdominal tenderness.  Skin:    General: Skin is warm and dry.  Neurological:     Mental Status: He is alert.  Psychiatric:        Mood and Affect: Mood normal.        Behavior: Behavior normal.          Scheduled Medications:   amiodarone   200 mg Oral Daily   atorvastatin   10 mg Oral Daily   edoxaban   30 mg Oral Daily   enzalutamide   160 mg Oral Daily   feeding supplement (GLUCERNA SHAKE)  237 mL Oral TID BM   insulin  aspart  0-9 Units Subcutaneous TID WC   pantoprazole   40 mg Oral Daily   polyethylene glycol  17 g Oral Daily   umeclidinium bromide   1 puff Inhalation Daily   verapamil   120 mg Oral QHS    Continuous Infusions:    PRN Medications:  acetaminophen  **OR** acetaminophen , prochlorperazine   Antimicrobials from admission:  Anti-infectives (From admission, onward)    None           Data Reviewed:  I have personally reviewed the following...  CBC: Recent Labs  Lab 09/20/23 1815 09/21/23 0622 09/22/23 1017 09/24/23 0211 09/25/23 0517  WBC 5.6 5.5 5.6 6.0 6.1  HGB 14.0 14.4 13.5 14.1 14.8  HCT 44.2 44.4 41.7  43.2 46.0  MCV 96.3 95.3 95.0 94.5 94.1  PLT 160 174 188 216 222   Basic Metabolic Panel: Recent Labs  Lab 09/22/23 1729 09/24/23 0211 09/25/23 0517 09/26/23 0459 09/27/23 0523  NA 136 138 138 135 136  K 4.7 4.8 4.6 4.7 4.7  CL 102 102 103 102 105  CO2 26 23 25 25 22   GLUCOSE 129* 157* 161* 157* 129*  BUN 41* 35* 32* 45* 48*  CREATININE 2.09* 1.88* 2.05* 2.83* 2.92*  CALCIUM  9.1 9.1 9.0 8.9 8.7*  MG 2.1  --   --   --   --    GFR: Estimated Creatinine Clearance: 19.8 mL/min (A) (by C-G formula based on SCr of 2.92 mg/dL (H)). Liver Function Tests: No results for input(s): AST, ALT, ALKPHOS, BILITOT, PROT, ALBUMIN  in the last 168 hours.  No results for input(s): LIPASE, AMYLASE in the last 168 hours. No results for input(s): AMMONIA in the last 168 hours. Coagulation Profile: No results for input(s): INR, PROTIME in the last 168 hours.  Cardiac Enzymes: No results for input(s): CKTOTAL, CKMB, CKMBINDEX, TROPONINI in the last 168 hours. BNP (last 3 results) No results for input(s): PROBNP in the last 8760 hours. HbA1C: No results for input(s): HGBA1C in the last 72 hours. CBG: Recent Labs  Lab 09/26/23 1150 09/26/23 1823 09/26/23 2240 09/27/23 0748 09/27/23 1143  GLUCAP 176* 200* 170* 144* 160*   Lipid Profile: No results for input(s): CHOL, HDL, LDLCALC, TRIG, CHOLHDL, LDLDIRECT in the last 72 hours. Thyroid  Function Tests: No results for input(s): TSH, T4TOTAL, FREET4, T3FREE, THYROIDAB in the last 72 hours. Anemia Panel: No results for input(s): VITAMINB12, FOLATE, FERRITIN, TIBC, IRON, RETICCTPCT in the last 72 hours. Most Recent Urinalysis On File:     Component Value Date/Time   COLORURINE YELLOW (A) 09/18/2023 1554   APPEARANCEUR CLEAR (A) 09/18/2023 1554   APPEARANCEUR Clear 12/20/2021 1615   LABSPEC 1.013 09/18/2023 1554   LABSPEC 1.015 10/12/2013 1445   PHURINE 5.0 09/18/2023 1554    GLUCOSEU >=500 (A) 09/18/2023 1554   GLUCOSEU 50 mg/dL 90/77/7984 8554   HGBUR MODERATE (A) 09/18/2023 1554   BILIRUBINUR NEGATIVE 09/18/2023 1554   BILIRUBINUR Negative 12/20/2021 1615   BILIRUBINUR Negative 10/12/2013 1445  KETONESUR NEGATIVE 09/18/2023 1554   PROTEINUR NEGATIVE 09/18/2023 1554   UROBILINOGEN 0.2 06/29/2019 1556   NITRITE NEGATIVE 09/18/2023 1554   LEUKOCYTESUR NEGATIVE 09/18/2023 1554   LEUKOCYTESUR Negative 10/12/2013 1445   Sepsis Labs: @LABRCNTIP (procalcitonin:4,lacticidven:4) Microbiology: Recent Results (from the past 240 hours)  Urine Culture (for pregnant, neutropenic or urologic patients or patients with an indwelling urinary catheter)     Status: None   Collection Time: 09/18/23  3:54 PM   Specimen: Urine, Clean Catch  Result Value Ref Range Status   Specimen Description   Final    URINE, CLEAN CATCH Performed at [facility], [address] [place], [place] [address]    Special Requests   Final    NONE Performed at [facility], [address] [place], [place] [address]    Culture   Final    NO GROWTH Performed at [facility], [address], [place], [place] [address]    Report Status 09/20/2023 FINAL  Final  Culture, blood (Routine X 2) w Reflex to ID Panel     Status: None   Collection Time: 09/19/23  2:48 PM   Specimen: BLOOD  Result Value Ref Range Status   Specimen Description   Final    BLOOD BLOOD LEFT ARM Performed at [facility], [address] [place], [place] [address]    Special Requests   Final    BOTTLES DRAWN AEROBIC AND ANAEROBIC Blood Culture adequate volume Performed at [facility], [address], [place], [place] [address]    Culture   Final    NO GROWTH 5 DAYS Performed at [facility], [address], [place], [place] [address]    Report Status 09/24/2023 FINAL  Final  Culture, blood (Routine X 2) w Reflex to ID Panel     Status: None   Collection  Time: 09/19/23  2:48 PM   Specimen: BLOOD  Result Value Ref Range Status   Specimen Description   Final    BLOOD BLOOD LEFT HAND Performed at [facility], [address], [place], [place] [address]    Special Requests   Final    BOTTLES DRAWN AEROBIC AND ANAEROBIC Blood Culture adequate volume Performed at [facility], [address] [place], [place] [address]    Culture   Final    NO GROWTH 5 DAYS Performed at [facility], [address] [place], [place] [address]    Report Status 09/24/2023 FINAL  Final  Resp panel by RT-PCR (RSV, Flu A&B, Covid) Anterior Nasal Swab     Status: None   Collection Time: 09/19/23  4:27 PM   Specimen: Anterior Nasal Swab  Result Value Ref Range Status   SARS Coronavirus 2 by RT PCR NEGATIVE NEGATIVE Final    Comment: (NOTE) SARS-CoV-2 target nucleic acids are NOT DETECTED.  The SARS-CoV-2 RNA is generally detectable in upper respiratory specimens during the acute phase of infection. The lowest concentration of SARS-CoV-2 viral copies this assay can detect is 138 copies/mL. A negative result does not preclude SARS-Cov-2 infection and should not be used as the sole basis for treatment or other patient management decisions. A negative result may occur with  improper specimen collection/handling, submission of specimen other than nasopharyngeal swab, presence of viral mutation(s) within the areas targeted by this assay, and inadequate number of viral copies(<138 copies/mL). A negative result must be combined with clinical observations, patient history, and epidemiological information. The expected result is  Negative.  Fact Sheet for Patients:  BloggerCourse.com  Fact Sheet for Healthcare Providers:  SeriousBroker.it  This test is no t yet approved or cleared by the [place]  FDA and  has been authorized for detection and/or diagnosis of SARS-CoV-2 by FDA under  an Emergency Use Authorization (EUA). This EUA will remain  in effect (meaning this test can be used) for the duration of the COVID-19 declaration under Section 564(b)(1) of the Act, 21 U.S.C.section 360bbb-3(b)(1), unless the authorization is terminated  or revoked sooner.       Influenza A by PCR NEGATIVE NEGATIVE Final   Influenza B by PCR NEGATIVE NEGATIVE Final    Comment: (NOTE) The Xpert Xpress SARS-CoV-2/FLU/RSV plus assay is intended as an aid in the diagnosis of influenza from Nasopharyngeal swab specimens and should not be used as a sole basis for treatment. Nasal washings and aspirates are unacceptable for Xpert Xpress SARS-CoV-2/FLU/RSV testing.  Fact Sheet for Patients: BloggerCourse.com  Fact Sheet for Healthcare Providers: SeriousBroker.it  This test is not yet approved or cleared by the [place]  FDA and has been authorized for detection and/or diagnosis of SARS-CoV-2 by FDA under an Emergency Use Authorization (EUA). This EUA will remain in effect (meaning this test can be used) for the duration of the COVID-19 declaration under Section 564(b)(1) of the Act, 21 U.S.C. section 360bbb-3(b)(1), unless the authorization is terminated or revoked.     Resp Syncytial Virus by PCR NEGATIVE NEGATIVE Final    Comment: (NOTE) Fact Sheet for Patients: BloggerCourse.com  Fact Sheet for Healthcare Providers: SeriousBroker.it  This test is not yet approved or cleared by the [place]  FDA and has been authorized for detection and/or diagnosis of SARS-CoV-2 by FDA under an Emergency Use Authorization (EUA). This EUA will remain in effect (meaning this test can be used) for the duration of the COVID-19 declaration under Section 564(b)(1) of the Act, 21 U.S.C. section 360bbb-3(b)(1), unless the authorization is terminated or revoked.  Performed at [facility], [address] [place], [place] [address]   Gastrointestinal Panel by PCR , Stool     Status: Abnormal   Collection Time: 09/19/23  8:00 PM   Specimen: STOOL  Result Value Ref Range Status   Campylobacter species NOT DETECTED NOT DETECTED Final   Plesimonas shigelloides NOT DETECTED NOT DETECTED Final   Salmonella species NOT DETECTED NOT DETECTED Final   Yersinia enterocolitica NOT DETECTED NOT DETECTED Final   Vibrio species NOT DETECTED NOT DETECTED Final   Vibrio cholerae NOT DETECTED NOT DETECTED Final   Enteroaggregative E coli (EAEC) NOT DETECTED NOT DETECTED Final   Enteropathogenic E coli (EPEC) NOT DETECTED NOT DETECTED Final   Enterotoxigenic E coli (ETEC) NOT DETECTED NOT DETECTED Final   Shiga like toxin producing E coli (STEC) NOT DETECTED NOT DETECTED Final   Shigella/Enteroinvasive E coli (EIEC) NOT DETECTED NOT DETECTED Final   Cryptosporidium NOT DETECTED NOT DETECTED Final   Cyclospora cayetanensis NOT DETECTED NOT DETECTED Final   Entamoeba histolytica NOT DETECTED NOT DETECTED Final   Giardia lamblia NOT DETECTED NOT DETECTED Final   Adenovirus F40/41 NOT DETECTED NOT DETECTED Final   Astrovirus NOT DETECTED NOT DETECTED Final   Norovirus GI/GII DETECTED (A) NOT DETECTED Final    Comment: RESULT CALLED TO, READ BACK BY AND VERIFIED WITH: JOYYA BAYNUM, LPN @0437  09/20/2023 COP    Rotavirus A NOT DETECTED NOT DETECTED Final   Sapovirus (I, II, IV, and V) NOT DETECTED NOT DETECTED Final  Comment: Performed at [facility], [address] [place], [place] [address]  C Difficile Quick Screen w PCR reflex     Status: None   Collection Time: 09/19/23  8:00 PM   Specimen: STOOL  Result Value Ref Range Status   C Diff antigen NEGATIVE NEGATIVE Final   C Diff toxin NEGATIVE NEGATIVE Final   C Diff interpretation No C. difficile detected.  Final    Comment: Performed at [facility], [address] [place], [place] [address]       Radiology Studies last 3 days: US  RENAL Result Date: 09/27/2023 CLINICAL DATA:  Acute on chronic kidney disease. EXAM: RENAL / URINARY TRACT ULTRASOUND COMPLETE COMPARISON:  September 18, 2023.  October 21, 2014. FINDINGS: Right Kidney: Renal measurements: 8.8 x 5.7 x 6.3 cm = volume: 164 mL. Increased echogenicity of renal parenchyma is noted suggesting medical renal disease. Lobulated cortical margins are noted consistent with scarring. Several echogenic foci are noted suggesting nephrolithiasis. No mass or hydronephrosis visualized. Left Kidney: Renal measurements: 9.9 x 6.9 x 3.8 cm = volume: 136 mL. Increased echogenicity of renal parenchyma is noted. Lobulated cortical margins are noted suggesting scarring. Several echogenic foci are noted suggesting nephrolithiasis. No mass or hydronephrosis visualized. Bladder: Appears normal for degree of bladder distention. Other: None. IMPRESSION: Increased echogenicity of renal parenchyma is noted bilaterally consistent with medical renal disease. No hydronephrosis or renal obstruction is noted. Bilateral nephrolithiasis is noted. Electronically Signed   By: Lynwood Landy Raddle M.D.   On: 09/27/2023 11:14         Sherri Mcarthy, DO [facility] 09/27/2023, 3:38 PM    Dictation software may have been used to generate the above note. Typos may occur and escape review in typed/dictated notes. Please contact Dr Marsa directly for clarity if needed.  Staff may message me via secure chat in Epic  but this may not receive an immediate response,  please page me for urgent matters!  If 7PM-7AM, please contact night coverage [email or web address]

## 2023-09-27 NOTE — Significant Event (Signed)
       CROSS COVER NOTE  NAME: [name] MRN: [id number] DOB : [birth year] ATTENDING PHYSICIAN: Marsa Edelman, DO    Date of Service   09/27/2023   HPI/Events of Note     HPI Hypertension, hyperlipidemia, T2DM, permanent a fib on edoxaban , Vtach, SSS S//P ppm, HFrEF 45-50%, Stage IV B metastatic prostate cancer on Xtandi  and leuprolide . Admitted 8/28 with 2 week hx progressive confusion thought to be amiodarone  induced encephalopathy and diarrhea stool + for norovirus, hematochezia and AKI on CKD  was admitted from 6/2 to 6/6 due to monomorphic V. tach during which he underwent t LHC with Dr. Ammon (06/03) and revealed 40% stenosed Ost/prox LAD, mild LV systolic dysfunction. He was also admitted from 6/7 to 6/20 due to left MCA infarct with left M1 occlusion s/p mechanical thrombectomy with TICI3 revascularization, etiology likely A-fib on DOAC in the setting of recent cardioversion.     Interventions   Assessment/Plan:    09/27/2023    9:04 PM 09/27/2023    3:42 PM 09/27/2023    7:51 AM  Vitals with BMI  Systolic 134 110 878  Diastolic 94 76 82  Pulse 106 85 84   Patient awake Denies chest pain. No apparent distress Telemonitpo appears v tach  in LEAD II, monitoring lead changed to LEAD I svr with PVC. EKG a fib with PVCs Continue to monitor on tele        Erminio LITTIE Cone NP [facility] Cross Cover 7pm-7am - check amion for availability Pager [phone]

## 2023-09-27 NOTE — Significant Event (Incomplete)
       CROSS COVER NOTE  NAME: [name] MRN: [id number] DOB : [birth year] ATTENDING PHYSICIAN: Marsa Edelman, DO    Date of Service   09/27/2023   HPI/Events of Note     HPI Hypertension, hyperlipidemia, T2DM, permanent a fib on edoxaban , Vtach, SSS S//P ppm, HFrEF 45-50%, Stage IV B metastatic prostate cancer on Xtandi  and leuprolide . Admitted 8/28 with 2 week hx progressive confusion thought to be amiodarone  induced encephalopathy and diarrhea stool + for norovirus, hematochezia and AKI on CKD  was admitted from 6/2 to 6/6 due to monomorphic V. tach during which he underwent t LHC with Dr. Ammon (06/03) and revealed 40% stenosed Ost/prox LAD, mild LV systolic dysfunction. He was also admitted from 6/7 to 6/20 due to left MCA infarct with left M1 occlusion s/p mechanical thrombectomy with TICI3 revascularization, etiology likely A-fib on DOAC in the setting of recent cardioversion.     Interventions   Assessment/Plan:    09/27/2023    9:04 PM 09/27/2023    3:42 PM 09/27/2023    7:51 AM  Vitals with BMI  Systolic 134 110 878  Diastolic 94 76 82  Pulse 106 85 84   Patient awake Denies chest pain. No apprent distress Telemonitpo appears v tach  in LEAD II, monitoring lead changed to LEAD I sr with PVC. X X X

## 2023-09-28 DIAGNOSIS — G9341 Metabolic encephalopathy: Secondary | ICD-10-CM | POA: Diagnosis not present

## 2023-09-28 LAB — BASIC METABOLIC PANEL WITH GFR
Anion gap: 11 (ref 5–15)
BUN: 53 mg/dL — ABNORMAL HIGH (ref 8–23)
CO2: 21 mmol/L — ABNORMAL LOW (ref 22–32)
Calcium: 8.9 mg/dL (ref 8.9–10.3)
Chloride: 104 mmol/L (ref 98–111)
Creatinine, Ser: 3.18 mg/dL — ABNORMAL HIGH (ref 0.61–1.24)
GFR, Estimated: 18 mL/min — ABNORMAL LOW (ref >60)
Glucose, Bld: 109 mg/dL — ABNORMAL HIGH (ref 70–99)
Potassium: 4.7 mmol/L (ref 3.5–5.1)
Sodium: 136 mmol/L (ref 135–145)

## 2023-09-28 LAB — CBC
HCT: 44.6 % (ref 39.0–52.0)
Hemoglobin: 14.9 g/dL (ref 13.0–17.0)
MCH: 31.1 pg (ref 26.0–34.0)
MCHC: 33.4 g/dL (ref 30.0–36.0)
MCV: 93.1 fL (ref 80.0–100.0)
Platelets: 236 K/uL (ref 150–400)
RBC: 4.79 MIL/uL (ref 4.22–5.81)
RDW: 15.6 % — ABNORMAL HIGH (ref 11.5–15.5)
WBC: 5.6 K/uL (ref 4.0–10.5)
nRBC: 0 % (ref 0.0–0.2)

## 2023-09-28 LAB — GLUCOSE, CAPILLARY
Glucose-Capillary: 138 mg/dL — ABNORMAL HIGH (ref 70–99)
Glucose-Capillary: 151 mg/dL — ABNORMAL HIGH (ref 70–99)
Glucose-Capillary: 84 mg/dL (ref 70–99)
Glucose-Capillary: 129 mg/dL — ABNORMAL HIGH (ref 70–99)

## 2023-09-28 LAB — TROPONIN I (HIGH SENSITIVITY): Troponin I (High Sensitivity): 31 ng/L — ABNORMAL HIGH (ref <18)

## 2023-09-28 NOTE — Progress Notes (Signed)
 PROGRESS NOTE    IAAN OREGEL   FMW:[id number] DOB: [birth year]  DOA: 09/18/2023 Date of Service: 09/28/23 which is hospital day 10  PCP: Liana Fish, NP    Hospital course / significant events:   HPI: [name] is a 89 y.o. male with medical history significant of hypertension, hyperlipidemia, T2DM, permanent atrial fibrillation on edoxaban , SSS s/p PPM, HFrEF with EF of 45 to 50% -06/24/2023), V-Tach on amiodarone , stage IVb metastatic prostate cancer on Xtandi  and leuprolide  who presents to the emergency department from home via EMS due to 2-week onset of progressive worsening confusion.  Patient was unable to provide history, history was obtained from EDP, ED medical record and daughter-in-law at bedside.  Per report, patient lives at home with wife who has dementia and he was able to take care of his ADLs and care for his wife.  However, since past 2-3 weeks, family checked on him from time to time noted changes in cognition and several episodes of diarrhea.  It was also noted that he has not been taking his medications including amiodarone  as prescribed (he was taking more pills than he was supposed to), assumptions made based on number of pills in his pillbox.  His occupational therapist followed up with him and noted he was covered in stool and was more confused, so he requested for him to be taken to the ED for further evaluation and management.   Of note: was admitted from 6/2 to 6/6 due to monomorphic V. tach during which he underwent t LHC with Dr. Ammon (06/03) and revealed 40% stenosed Ost/prox LAD, mild LV systolic dysfunction. He was also admitted from 6/7 to 6/20 due to left MCA infarct with left M1 occlusion s/p mechanical thrombectomy with TICI3 revascularization, etiology likely A-fib on DOAC in the setting of recent cardioversion.   On this admission Encephalopathy likely multifactorial w/ recent stroke affecting mentation. MRI nothing acute, no anemia, no  electrolyte abnormalities, normal tsh, normal LFTs. Diarrhea/dehydration w/ AKI also might be contirbuting (+)Norovirus explains diarrhea  Hematochezia - appreciate GI input.  Patient had a flexible sigmoidoscopy which showed a rectal ulcer. Record review previously there was plan to stop anticoagulation but pt appears to have been taking this at home. Will dc this medication.  Renal function has continued to worsen, 09/26/23 trial fluids (low po intake) still worsening, not improving --> renal US , repeat UA, get postvoid residual, nephro consult - suspect advancing CKD? Per nephrology, ok to follow on discharge --> continues to be worse into 09/07, no change to plan per nephrology as UOP is ok, no need dialysis     Consultants:  Gastroenterology   Procedures/Surgeries: 09/23/23 flexible sigmoidoscopy       ASSESSMENT & PLAN:   Acute encephalopathy - improved/resolved  Likely multifactorial, recent stroke biggest contributor.  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 (baseline Cr 1.4-1.5 and GFR 45-48 in 06/2023 though previous hx noted CKD3b per notes)  Question progression to CKD3b/4 (Cr here has been 1.8-2 and GFR 30-35 --> Cr 2.9  and GFR 20 09/27/23 --> 3.18 and 18 09/28/23)  Secondary to GI losses from diarrhea, poor po intake vs possible advancing CKD Renal US  - medical renal disease as expected  Repeat UA + micro Calculate FeNa Monitor postvoid residual  monitor BMP Nephrology consult -  should  be ok to follow outpatient and no concern for discharge / no need to start dialysis    Hx v-tach History of A-fib Has pacer Continue amiodarone  for rate control   T2DM Euglycemic Hold home farxiga  for now per nephrology  SSI ac for now, of note pt will not do ok w/ taking insulin  / injectables at home per his POA   PAD Non-obstructive CAD Continue home statin Resume anticoag per daughter request/preference see above  HFrEF Last echo on file 06/24/2023 - LVEF of 45 to 50%, 2D echocardiogram which was done 06/24 Continue to hold home farxiga  w/ low BP holding lasix  for now d/t AKI/CKD, will diurese if clinically fluid up  holding metoprolol  d/t low BP Cautious fluids today given worsening renal function   Hx DVT Anticoagulant discontinued back in June per record review, daughter requests/prefers he is on this given CVA hx, see above    Metastatic prostate cancer Can resume home xtandi      COPD Quiescent/stable  incruse for home spiriva     overweight based on BMI: Body mass index is 27.34 kg/m.SABRA Significantly low or high BMI is associated with higher medical risk.  Underweight - under 18  overweight - 25 to 29 obese - 30 or more Class 1 obesity: BMI of 30.0 to 34 Class 2 obesity: BMI of 35.0 to 39 Class 3 obesity: BMI of 40.0 to 49 Super Morbid Obesity: BMI 50-59 Super-super Morbid Obesity: BMI 60+ Healthy nutrition and physical activity advised as adjunct to other disease management and risk reduction treatments   DVT prophylaxis: SCD IV fluids: no continuous IV fluids  Nutrition: cardiac/carb diet Central lines / other devices: none  Code Status: FULL CODE ACP documentation reviewed: HCPOA on file in [facility]  TOC needs: SNF placement will need re-do auth over the weekend  Medical barriers to dispo: renal function - if improving/stable can plan for dc             Subjective / Brief ROS:  Patient reports no concerns Denies CP/SOB.     Family  Communication: spoke on phone w/ daughter in law / [place] Tiffany 09/28/23 12:57 PM     Objective Findings:  Vitals:   09/27/23 1542 09/27/23 2104 09/28/23 0448 09/28/23 0814  BP: 110/76 (!) 134/94 124/86 110/70  Pulse: 85 (!) 106 81 90  Resp: 17 19 18 18   Temp: 97.7 F (36.5 C) 98.1 F (36.7 C) 97.9 F (36.6 C) 98.6 F (37 C)  TempSrc: Oral   Oral  SpO2: 97% 98% 97%   Weight:      Height:        Intake/Output Summary (Last 24 hours) at 09/28/2023 1257 Last data filed at 09/28/2023 0900 Gross per 24 hour  Intake 480 ml  Output 900 ml  Net -420 ml   Filed Weights   09/18/23 1546  Weight: 94 kg    Examination:  Physical Exam Constitutional:      General: He is not in acute distress. Cardiovascular:     Rate and Rhythm: Normal rate and regular rhythm.  Pulmonary:     Effort: Pulmonary effort is normal.     Breath sounds: Normal breath sounds.  Abdominal:  General: Bowel sounds are normal.     Tenderness: There is no abdominal tenderness.  Skin:    General: Skin is warm and dry.  Neurological:     Mental Status: He is alert.  Psychiatric:        Mood and Affect: Mood normal.        Behavior: Behavior normal.          Scheduled Medications:   amiodarone   200 mg Oral Daily   atorvastatin   10 mg Oral Daily   edoxaban   30 mg Oral Daily   enzalutamide   160 mg Oral Daily   feeding supplement (GLUCERNA SHAKE)  237 mL Oral TID BM   insulin  aspart  0-9 Units Subcutaneous TID WC   pantoprazole   40 mg Oral Daily   polyethylene glycol  17 g Oral Daily   umeclidinium bromide   1 puff Inhalation Daily   verapamil   120 mg Oral QHS    Continuous Infusions:    PRN Medications:  acetaminophen  **OR** acetaminophen , prochlorperazine   Antimicrobials from admission:  Anti-infectives (From admission, onward)    None           Data Reviewed:  I have personally reviewed the following...  CBC: Recent Labs  Lab 09/22/23 1017 09/24/23 0211 09/25/23 0517  09/28/23 0534  WBC 5.6 6.0 6.1 5.6  HGB 13.5 14.1 14.8 14.9  HCT 41.7 43.2 46.0 44.6  MCV 95.0 94.5 94.1 93.1  PLT 188 216 222 236   Basic Metabolic Panel: Recent Labs  Lab 09/22/23 1729 09/24/23 0211 09/25/23 0517 09/26/23 0459 09/27/23 0523 09/27/23 2303 09/28/23 0534  NA 136 138 138 135 136  --  136  K 4.7 4.8 4.6 4.7 4.7  --  4.7  CL 102 102 103 102 105  --  104  CO2 26 23 25 25 22   --  21*  GLUCOSE 129* 157* 161* 157* 129*  --  109*  BUN 41* 35* 32* 45* 48*  --  53*  CREATININE 2.09* 1.88* 2.05* 2.83* 2.92*  --  3.18*  CALCIUM  9.1 9.1 9.0 8.9 8.7*  --  8.9  MG 2.1  --   --   --   --  2.3  --    GFR: Estimated Creatinine Clearance: 18.1 mL/min (A) (by C-G formula based on SCr of 3.18 mg/dL (H)). Liver Function Tests: No results for input(s): AST, ALT, ALKPHOS, BILITOT, PROT, ALBUMIN  in the last 168 hours.  No results for input(s): LIPASE, AMYLASE in the last 168 hours. No results for input(s): AMMONIA in the last 168 hours. Coagulation Profile: No results for input(s): INR, PROTIME in the last 168 hours.  Cardiac Enzymes: No results for input(s): CKTOTAL, CKMB, CKMBINDEX, TROPONINI in the last 168 hours. BNP (last 3 results) No results for input(s): PROBNP in the last 8760 hours. HbA1C: No results for input(s): HGBA1C in the last 72 hours. CBG: Recent Labs  Lab 09/27/23 1143 09/27/23 1649 09/27/23 2159 09/28/23 0808 09/28/23 1135  GLUCAP 160* 114* 155* 138* 151*   Lipid Profile: No results for input(s): CHOL, HDL, LDLCALC, TRIG, CHOLHDL, LDLDIRECT in the last 72 hours. Thyroid  Function Tests: No results for input(s): TSH, T4TOTAL, FREET4, T3FREE, THYROIDAB in the last 72 hours. Anemia Panel: No results for input(s): VITAMINB12, FOLATE, FERRITIN, TIBC, IRON, RETICCTPCT in the last 72 hours. Most Recent Urinalysis On File:     Component Value Date/Time   COLORURINE YELLOW (A)  09/18/2023 1554   APPEARANCEUR CLEAR (A) 09/18/2023 1554   APPEARANCEUR  Clear 12/20/2021 1615   LABSPEC 1.013 09/18/2023 1554   LABSPEC 1.015 10/12/2013 1445   PHURINE 5.0 09/18/2023 1554   GLUCOSEU >=500 (A) 09/18/2023 1554   GLUCOSEU 50 mg/dL 90/77/7984 8554   HGBUR MODERATE (A) 09/18/2023 1554   BILIRUBINUR NEGATIVE 09/18/2023 1554   BILIRUBINUR Negative 12/20/2021 1615   BILIRUBINUR Negative 10/12/2013 1445   KETONESUR NEGATIVE 09/18/2023 1554   PROTEINUR NEGATIVE 09/18/2023 1554   UROBILINOGEN 0.2 06/29/2019 1556   NITRITE NEGATIVE 09/18/2023 1554   LEUKOCYTESUR NEGATIVE 09/18/2023 1554   LEUKOCYTESUR Negative 10/12/2013 1445   Sepsis Labs: @LABRCNTIP (procalcitonin:4,lacticidven:4) Microbiology: Recent Results (from the past 240 hours)  Urine Culture (for pregnant, neutropenic or urologic patients or patients with an indwelling urinary catheter)     Status: None   Collection Time: 09/18/23  3:54 PM   Specimen: Urine, Clean Catch  Result Value Ref Range Status   Specimen Description   Final    URINE, CLEAN CATCH Performed at [facility], [address], [place], [place] [address]    Special Requests   Final    NONE Performed at [facility], [address], [place], [place] [address]    Culture   Final    NO GROWTH Performed at [facility], [address], [place], [place] [address]    Report Status 09/20/2023 FINAL  Final  Culture, blood (Routine X 2) w Reflex to ID Panel     Status: None   Collection Time: 09/19/23  2:48 PM   Specimen: BLOOD  Result Value Ref Range Status   Specimen Description   Final    BLOOD BLOOD LEFT ARM Performed at [facility], [address], [place], [place] [address]    Special Requests   Final    BOTTLES DRAWN AEROBIC AND ANAEROBIC Blood Culture adequate volume Performed at [facility], [address] [place], [place] [address]    Culture   Final    NO GROWTH 5 DAYS Performed at  [facility], [address], [place], [place] [address]    Report Status 09/24/2023 FINAL  Final  Culture, blood (Routine X 2) w Reflex to ID Panel     Status: None   Collection Time: 09/19/23  2:48 PM   Specimen: BLOOD  Result Value Ref Range Status   Specimen Description   Final    BLOOD BLOOD LEFT HAND Performed at [facility], [address] [place], [place] [address]    Special Requests   Final    BOTTLES DRAWN AEROBIC AND ANAEROBIC Blood Culture adequate volume Performed at [facility], [address] [place], [place] [address]    Culture   Final    NO GROWTH 5 DAYS Performed at [facility], [address], [place], [place] [address]    Report Status 09/24/2023 FINAL  Final  Resp panel by RT-PCR (RSV, Flu A&B, Covid) Anterior Nasal Swab     Status: None   Collection Time: 09/19/23  4:27 PM   Specimen: Anterior Nasal Swab  Result Value Ref Range Status   SARS Coronavirus 2 by RT PCR NEGATIVE NEGATIVE Final    Comment: (NOTE) SARS-CoV-2 target nucleic acids are NOT DETECTED.  The SARS-CoV-2 RNA is generally detectable in upper respiratory specimens during the acute phase of infection. The lowest concentration of SARS-CoV-2 viral copies this assay can detect is 138 copies/mL. A negative result does not preclude SARS-Cov-2 infection and should not be used as the  sole basis for treatment or other patient management decisions. A negative result may occur with  improper specimen collection/handling, submission of specimen other than nasopharyngeal swab, presence of viral mutation(s) within the areas targeted by this assay, and inadequate number of viral copies(<138 copies/mL). A negative result must be combined with clinical observations, patient history, and epidemiological information. The expected result is Negative.  Fact Sheet for Patients:  BloggerCourse.com  Fact Sheet for Healthcare Providers:   SeriousBroker.it  This test is no t yet approved or cleared by the [place]  FDA and  has been authorized for detection and/or diagnosis of SARS-CoV-2 by FDA under an Emergency Use Authorization (EUA). This EUA will remain  in effect (meaning this test can be used) for the duration of the COVID-19 declaration under Section 564(b)(1) of the Act, 21 U.S.C.section 360bbb-3(b)(1), unless the authorization is terminated  or revoked sooner.       Influenza A by PCR NEGATIVE NEGATIVE Final   Influenza B by PCR NEGATIVE NEGATIVE Final    Comment: (NOTE) The Xpert Xpress SARS-CoV-2/FLU/RSV plus assay is intended as an aid in the diagnosis of influenza from Nasopharyngeal swab specimens and should not be used as a sole basis for treatment. Nasal washings and aspirates are unacceptable for Xpert Xpress SARS-CoV-2/FLU/RSV testing.  Fact Sheet for Patients: BloggerCourse.com  Fact Sheet for Healthcare Providers: SeriousBroker.it  This test is not yet approved or cleared by the [place]  FDA and has been authorized for detection and/or diagnosis of SARS-CoV-2 by FDA under an Emergency Use Authorization (EUA). This EUA will remain in effect (meaning this test can be used) for the duration of the COVID-19 declaration under Section 564(b)(1) of the Act, 21 U.S.C. section 360bbb-3(b)(1), unless the authorization is terminated or revoked.     Resp Syncytial Virus by PCR NEGATIVE NEGATIVE Final    Comment: (NOTE) Fact Sheet for Patients: BloggerCourse.com  Fact Sheet for Healthcare Providers: SeriousBroker.it  This test is not yet approved or cleared by the [place]  FDA and has been authorized for detection and/or diagnosis of SARS-CoV-2 by FDA under an Emergency Use Authorization (EUA). This EUA will remain in effect (meaning this test can be used) for  the duration of the COVID-19 declaration under Section 564(b)(1) of the Act, 21 U.S.C. section 360bbb-3(b)(1), unless the authorization is terminated or revoked.  Performed at [facility], [address] [place], [place] [address]   Gastrointestinal Panel by PCR , Stool     Status: Abnormal   Collection Time: 09/19/23  8:00 PM   Specimen: STOOL  Result Value Ref Range Status   Campylobacter species NOT DETECTED NOT DETECTED Final   Plesimonas shigelloides NOT DETECTED NOT DETECTED Final   Salmonella species NOT DETECTED NOT DETECTED Final   Yersinia enterocolitica NOT DETECTED NOT DETECTED Final   Vibrio species NOT DETECTED NOT DETECTED Final   Vibrio cholerae NOT DETECTED NOT DETECTED Final   Enteroaggregative E coli (EAEC) NOT DETECTED NOT DETECTED Final   Enteropathogenic E coli (EPEC) NOT DETECTED NOT DETECTED Final   Enterotoxigenic E coli (ETEC) NOT DETECTED NOT DETECTED Final   Shiga like toxin producing E coli (STEC) NOT DETECTED NOT DETECTED Final   Shigella/Enteroinvasive E coli (EIEC) NOT DETECTED NOT DETECTED Final   Cryptosporidium NOT DETECTED NOT DETECTED Final   Cyclospora cayetanensis NOT DETECTED NOT DETECTED Final   Entamoeba histolytica NOT DETECTED NOT DETECTED Final   Giardia lamblia NOT DETECTED NOT DETECTED Final   Adenovirus F40/41 NOT DETECTED NOT DETECTED Final  Astrovirus NOT DETECTED NOT DETECTED Final   Norovirus GI/GII DETECTED (A) NOT DETECTED Final    Comment: RESULT CALLED TO, READ BACK BY AND VERIFIED WITH: JOYYA BAYNUM, LPN @0437  09/20/2023 COP    Rotavirus A NOT DETECTED NOT DETECTED Final   Sapovirus (I, II, IV, and V) NOT DETECTED NOT DETECTED Final    Comment: Performed at [facility], [address] [place], [place] [address]  C Difficile Quick Screen w PCR reflex     Status: None   Collection Time: 09/19/23  8:00 PM   Specimen: STOOL  Result Value Ref Range Status   C Diff antigen NEGATIVE NEGATIVE Final   C  Diff toxin NEGATIVE NEGATIVE Final   C Diff interpretation No C. difficile detected.  Final    Comment: Performed at [facility], [address], [place], [place] [address]      Radiology Studies last 3 days: US  RENAL Result Date: 09/27/2023 CLINICAL DATA:  Acute on chronic kidney disease. EXAM: RENAL / URINARY TRACT ULTRASOUND COMPLETE COMPARISON:  September 18, 2023.  October 21, 2014. FINDINGS: Right Kidney: Renal measurements: 8.8 x 5.7 x 6.3 cm = volume: 164 mL. Increased echogenicity of renal parenchyma is noted suggesting medical renal disease. Lobulated cortical margins are noted consistent with scarring. Several echogenic foci are noted suggesting nephrolithiasis. No mass or hydronephrosis visualized. Left Kidney: Renal measurements: 9.9 x 6.9 x 3.8 cm = volume: 136 mL. Increased echogenicity of renal parenchyma is noted. Lobulated cortical margins are noted suggesting scarring. Several echogenic foci are noted suggesting nephrolithiasis. No mass or hydronephrosis visualized. Bladder: Appears normal for degree of bladder distention. Other: None. IMPRESSION: Increased echogenicity of renal parenchyma is noted bilaterally consistent with medical renal disease. No hydronephrosis or renal obstruction is noted. Bilateral nephrolithiasis is noted. Electronically Signed   By: Lynwood Landy Raddle M.D.   On: 09/27/2023 11:14         Laneta Blunt, DO [facility] 09/28/2023, 12:57 PM    Dictation software may have been used to generate the above note. Typos may occur and escape review in typed/dictated notes. Please contact Dr Blunt directly for clarity if needed.  Staff may message me via secure chat in Epic  but this may not receive an immediate response,  please page me for urgent matters!  If 7PM-7AM, please contact night coverage [email or web address]

## 2023-09-28 NOTE — Progress Notes (Signed)
 [facility]  ROUNDING NOTE   Subjective:   [name] is a 89 y.o. male with past medical history of atrial fibrillation, hypertension, aortic atherosclerosis, COPD, obstructive sleep apnea, CKD, osteoarthritis, diabetes, prostate cancer treated with Xtandi .  Patient presents to the emergency department for evaluation of left abdominal pain.  Patient has been admitted for Acute renal insufficiency [N28.9] Acute encephalopathy [G93.40] Skin breakdown [R23.8] Non compliance w medication regimen [Z91.148] Acute metabolic encephalopathy [G93.41]  Patient is known to our practice followed outpatient by Dr. Dennise.    Update: Patient seen sitting up in bed Eyes closed resting quietly No family present  Creatinine 3.18   Objective:  Vital signs in last 24 hours:  Temp:  [97.7 F (36.5 C)-98.6 F (37 C)] 98.6 F (37 C) (09/07 0814) Pulse Rate:  [81-106] 90 (09/07 0814) Resp:  [17-19] 18 (09/07 0814) BP: (110-134)/(70-94) 110/70 (09/07 0814) SpO2:  [97 %-98 %] 97 % (09/07 0448)  Weight change:  Filed Weights   09/18/23 1546  Weight: 94 kg    Intake/Output: I/O last 3 completed shifts: In: 480 [P.O.:480] Out: 2000 [Urine:2000]   Intake/Output this shift:  Total I/O In: 240 [P.O.:240] Out: -   Physical Exam: General: NAD, resting quietly   Head: Normocephalic, atraumatic. Moist oral mucosal membranes  Eyes: Anicteric  Lungs:  Clear to auscultation. Normal effort  Heart: Regular rate and rhythm  Abdomen:  Soft, nontender  Extremities:  No peripheral edema.  Neurologic: Somnolent  Skin: Warm,dry, no rash       Basic Metabolic Panel: Recent Labs  Lab 09/22/23 1729 09/24/23 0211 09/25/23 0517 09/26/23 0459 09/27/23 0523 09/27/23 2303 09/28/23 0534  NA 136 138 138 135 136  --  136  K 4.7 4.8 4.6 4.7 4.7  --  4.7  CL 102 102 103 102 105  --  104  CO2 26 23 25 25 22   --  21*  GLUCOSE 129* 157* 161* 157* 129*  --  109*  BUN 41* 35* 32* 45*  48*  --  53*  CREATININE 2.09* 1.88* 2.05* 2.83* 2.92*  --  3.18*  CALCIUM  9.1 9.1 9.0 8.9 8.7*  --  8.9  MG 2.1  --   --   --   --  2.3  --     Liver Function Tests: No results for input(s): AST, ALT, ALKPHOS, BILITOT, PROT, ALBUMIN  in the last 168 hours.  No results for input(s): LIPASE, AMYLASE in the last 168 hours. No results for input(s): AMMONIA in the last 168 hours.  CBC: Recent Labs  Lab 09/22/23 1017 09/24/23 0211 09/25/23 0517 09/28/23 0534  WBC 5.6 6.0 6.1 5.6  HGB 13.5 14.1 14.8 14.9  HCT 41.7 43.2 46.0 44.6  MCV 95.0 94.5 94.1 93.1  PLT 188 216 222 236    Cardiac Enzymes: No results for input(s): CKTOTAL, CKMB, CKMBINDEX, TROPONINI in the last 168 hours.  BNP: Invalid input(s): POCBNP  CBG: Recent Labs  Lab 09/27/23 0748 09/27/23 1143 09/27/23 1649 09/27/23 2159 09/28/23 0808  GLUCAP 144* 160* 114* 155* 138*    Microbiology: Results for orders placed or performed during the hospital encounter of 09/18/23  Urine Culture (for pregnant, neutropenic or urologic patients or patients with an indwelling urinary catheter)     Status: None   Collection Time: 09/18/23  3:54 PM   Specimen: Urine, Clean Catch  Result Value Ref Range Status   Specimen Description   Final    URINE, CLEAN CATCH Performed  at [facility], [address], [place], [place] [address]    Special Requests   Final    NONE Performed at [facility], [address], [place], [place] [address]    Culture   Final    NO GROWTH Performed at [facility], [address], [place], [place] [address]    Report Status 09/20/2023 FINAL  Final  Culture, blood (Routine X 2) w Reflex to ID Panel     Status: None   Collection Time: 09/19/23  2:48 PM   Specimen: BLOOD  Result Value Ref Range Status   Specimen Description   Final    BLOOD BLOOD LEFT ARM Performed at [facility], [address] [place], [place] [address]     Special Requests   Final    BOTTLES DRAWN AEROBIC AND ANAEROBIC Blood Culture adequate volume Performed at [facility], [address], [place], [place] [address]    Culture   Final    NO GROWTH 5 DAYS Performed at [facility], [address], [place], [place] [address]    Report Status 09/24/2023 FINAL  Final  Culture, blood (Routine X 2) w Reflex to ID Panel     Status: None   Collection Time: 09/19/23  2:48 PM   Specimen: BLOOD  Result Value Ref Range Status   Specimen Description   Final    BLOOD BLOOD LEFT HAND Performed at [facility], [address], [place], [place] [address]    Special Requests   Final    BOTTLES DRAWN AEROBIC AND ANAEROBIC Blood Culture adequate volume Performed at [facility], [address] [place], [place] [address]    Culture   Final    NO GROWTH 5 DAYS Performed at [facility], [address], [place], [place] [address]    Report Status 09/24/2023 FINAL  Final  Resp panel by RT-PCR (RSV, Flu A&B, Covid) Anterior Nasal Swab     Status: None   Collection Time: 09/19/23  4:27 PM   Specimen: Anterior Nasal Swab  Result Value Ref Range Status   SARS Coronavirus 2 by RT PCR NEGATIVE NEGATIVE Final    Comment: (NOTE) SARS-CoV-2 target nucleic acids are NOT DETECTED.  The SARS-CoV-2 RNA is generally detectable in upper respiratory specimens during the acute phase of infection. The lowest concentration of SARS-CoV-2 viral copies this assay can detect is 138 copies/mL. A negative result does not preclude SARS-Cov-2 infection and should not be used as the sole basis for treatment or other patient management decisions. A negative result may occur with  improper specimen collection/handling, submission of specimen other than nasopharyngeal swab, presence of viral mutation(s) within the areas targeted by this assay, and inadequate number of viral copies(<138 copies/mL). A negative result must be combined  with clinical observations, patient history, and epidemiological information. The expected result is Negative.  Fact Sheet for Patients:  BloggerCourse.com  Fact Sheet for Healthcare Providers:  SeriousBroker.it  This test is no t yet approved or cleared by the [place]  FDA and  has been authorized for detection and/or diagnosis of SARS-CoV-2 by FDA under an Emergency Use Authorization (EUA). This EUA will remain  in effect (meaning this test can be used) for the duration of the COVID-19 declaration under Section 564(b)(1) of the Act, 21 U.S.C.section 360bbb-3(b)(1), unless the authorization is terminated  or revoked sooner.       Influenza A by PCR NEGATIVE NEGATIVE Final  Influenza B by PCR NEGATIVE NEGATIVE Final    Comment: (NOTE) The Xpert Xpress SARS-CoV-2/FLU/RSV plus assay is intended as an aid in the diagnosis of influenza from Nasopharyngeal swab specimens and should not be used as a sole basis for treatment. Nasal washings and aspirates are unacceptable for Xpert Xpress SARS-CoV-2/FLU/RSV testing.  Fact Sheet for Patients: BloggerCourse.com  Fact Sheet for Healthcare Providers: SeriousBroker.it  This test is not yet approved or cleared by the [place]  FDA and has been authorized for detection and/or diagnosis of SARS-CoV-2 by FDA under an Emergency Use Authorization (EUA). This EUA will remain in effect (meaning this test can be used) for the duration of the COVID-19 declaration under Section 564(b)(1) of the Act, 21 U.S.C. section 360bbb-3(b)(1), unless the authorization is terminated or revoked.     Resp Syncytial Virus by PCR NEGATIVE NEGATIVE Final    Comment: (NOTE) Fact Sheet for Patients: BloggerCourse.com  Fact Sheet for Healthcare Providers: SeriousBroker.it  This test is not yet approved  or cleared by the [place]  FDA and has been authorized for detection and/or diagnosis of SARS-CoV-2 by FDA under an Emergency Use Authorization (EUA). This EUA will remain in effect (meaning this test can be used) for the duration of the COVID-19 declaration under Section 564(b)(1) of the Act, 21 U.S.C. section 360bbb-3(b)(1), unless the authorization is terminated or revoked.  Performed at [facility], [address] [place], [place] [address]   Gastrointestinal Panel by PCR , Stool     Status: Abnormal   Collection Time: 09/19/23  8:00 PM   Specimen: STOOL  Result Value Ref Range Status   Campylobacter species NOT DETECTED NOT DETECTED Final   Plesimonas shigelloides NOT DETECTED NOT DETECTED Final   Salmonella species NOT DETECTED NOT DETECTED Final   Yersinia enterocolitica NOT DETECTED NOT DETECTED Final   Vibrio species NOT DETECTED NOT DETECTED Final   Vibrio cholerae NOT DETECTED NOT DETECTED Final   Enteroaggregative E coli (EAEC) NOT DETECTED NOT DETECTED Final   Enteropathogenic E coli (EPEC) NOT DETECTED NOT DETECTED Final   Enterotoxigenic E coli (ETEC) NOT DETECTED NOT DETECTED Final   Shiga like toxin producing E coli (STEC) NOT DETECTED NOT DETECTED Final   Shigella/Enteroinvasive E coli (EIEC) NOT DETECTED NOT DETECTED Final   Cryptosporidium NOT DETECTED NOT DETECTED Final   Cyclospora cayetanensis NOT DETECTED NOT DETECTED Final   Entamoeba histolytica NOT DETECTED NOT DETECTED Final   Giardia lamblia NOT DETECTED NOT DETECTED Final   Adenovirus F40/41 NOT DETECTED NOT DETECTED Final   Astrovirus NOT DETECTED NOT DETECTED Final   Norovirus GI/GII DETECTED (A) NOT DETECTED Final    Comment: RESULT CALLED TO, READ BACK BY AND VERIFIED WITH: JOYYA BAYNUM, LPN @0437  09/20/2023 COP    Rotavirus A NOT DETECTED NOT DETECTED Final   Sapovirus (I, II, IV, and V) NOT DETECTED NOT DETECTED Final    Comment: Performed at [facility], [address] [place], [place] [address]  C Difficile Quick Screen w PCR reflex     Status: None   Collection Time: 09/19/23  8:00 PM   Specimen: STOOL  Result Value Ref Range Status   C Diff antigen NEGATIVE NEGATIVE Final   C Diff toxin NEGATIVE NEGATIVE Final   C Diff interpretation No C. difficile detected.  Final    Comment: Performed at [facility], [address] [place], [place] [address]   *Note: Due to a large number of results and/or encounters for the requested  time period, some results have not been displayed. A complete set of results can be found in Results Review.    Coagulation Studies: No results for input(s): LABPROT, INR in the last 72 hours.  Urinalysis: No results for input(s): COLORURINE, LABSPEC, PHURINE, GLUCOSEU, HGBUR, BILIRUBINUR, KETONESUR, PROTEINUR, UROBILINOGEN, NITRITE, LEUKOCYTESUR in the last 72 hours.  Invalid input(s): APPERANCEUR     Imaging: US  RENAL Result Date: 09/27/2023 CLINICAL DATA:  Acute on chronic kidney disease. EXAM: RENAL / URINARY TRACT ULTRASOUND COMPLETE COMPARISON:  September 18, 2023.  October 21, 2014. FINDINGS: Right Kidney: Renal measurements: 8.8 x 5.7 x 6.3 cm = volume: 164 mL. Increased echogenicity of renal parenchyma is noted suggesting medical renal disease. Lobulated cortical margins are noted consistent with scarring. Several echogenic foci are noted suggesting nephrolithiasis. No mass or hydronephrosis visualized. Left Kidney: Renal measurements: 9.9 x 6.9 x 3.8 cm = volume: 136 mL. Increased echogenicity of renal parenchyma is noted. Lobulated cortical margins are noted suggesting scarring. Several echogenic foci are noted suggesting nephrolithiasis. No mass or hydronephrosis visualized. Bladder: Appears normal for degree of bladder distention. Other: None. IMPRESSION: Increased echogenicity of renal parenchyma is noted bilaterally consistent with medical renal disease. No hydronephrosis  or renal obstruction is noted. Bilateral nephrolithiasis is noted. Electronically Signed   By: Lynwood Landy Raddle M.D.   On: 09/27/2023 11:14      Medications:      amiodarone   200 mg Oral Daily   atorvastatin   10 mg Oral Daily   edoxaban   30 mg Oral Daily   enzalutamide   160 mg Oral Daily   feeding supplement (GLUCERNA SHAKE)  237 mL Oral TID BM   insulin  aspart  0-9 Units Subcutaneous TID WC   pantoprazole   40 mg Oral Daily   polyethylene glycol  17 g Oral Daily   umeclidinium bromide   1 puff Inhalation Daily   verapamil   120 mg Oral QHS   acetaminophen  **OR** acetaminophen , prochlorperazine   Assessment/ Plan:  [name] is a 89 y.o.  male with past medical history of atrial fibrillation, hypertension, aortic atherosclerosis, COPD, obstructive sleep apnea, CKD, osteoarthritis, diabetes, prostate cancer treated with Xtandi .  Patient presents to the emergency department for evaluation of left abdominal pain.  Patient has been admitted for Acute renal insufficiency [N28.9] Acute encephalopathy [G93.40] Skin breakdown [R23.8] Non compliance w medication regimen [Z91.148] Acute metabolic encephalopathy [G93.41]   Acute Kidney Injury on chronic kidney disease stage IIIa with baseline creatinine 1.40 and GFR of 48 on 07/06/23.  Acute kidney injury secondary poor oral intake from confusion. Chronic kidney disease is secondary to hypertension and diabetes CT abd pelvis shows bilateral kidney stones without obstruction. No recent IV contrast exposure. Has received IV fluids during this admission.  Creatinine 3.18 today. Appears had LHC on 6/3, however creatinine was elevated prior to that.   Lab Results  Component Value Date   CREATININE 3.18 (H) 09/28/2023   CREATININE 2.92 (H) 09/27/2023   CREATININE 2.83 (H) 09/26/2023    Intake/Output Summary (Last 24 hours) at 09/28/2023 1011 Last data filed at 09/28/2023 0900 Gross per 24 hour  Intake 480 ml  Output 900 ml  Net -420  ml   2. Anemia of chronic kidney disease Lab Results  Component Value Date   HGB 14.9 09/28/2023    Hgb stable. No need of ESA during this time.   3. Hypertension with chronic kidney disease. Home regimen includes metoprolol  and verapamil . These have been resumed during this admission. Blood  pressure 110/70.  4. Secondary Hyperparathyroidism: with outpatient labs: PTH 1.2, phosphorus 3.5, calcium  9.9 on 02/26/23.   Lab Results  Component Value Date   CALCIUM  8.9 09/28/2023   CAION 1.22 06/28/2023   PHOS 3.0 09/19/2023    Will continue to monitor bone minerals.    LOS: 10 Corben Auzenne 9/7/202510:11 AM

## 2023-09-28 NOTE — TOC Progression Note (Addendum)
 Transition of Care [facility]) - Progression Note    Patient Details  Name: [name] MRN: [id number] Date of Birth: [birth year]  [facility] CM/SW Contact  Anastaisa Wooding E Tiffiny Worthy, LCSW Phone Number: 09/28/2023, 9:17 AM  Clinical Narrative:    Per handoff, plan for [employer] on 9/8. [employer], spoke with [employer]. She states the auth expired on 9/5 and a new auth still needs to be resubmitted.  Unable to access [employer] portal - called support line, they are closed on weekends.  Manually faxed updated auth request to [facility].                     Expected Discharge Plan and Services                                               Social Drivers of Health (SDOH) Interventions SDOH Screenings   Food Insecurity: No Food Insecurity (09/18/2023)  Housing: Low Risk  (09/18/2023)  Transportation Needs: No Transportation Needs (09/18/2023)  Utilities: Patient Unable To Answer (09/18/2023)  Alcohol Screen: Low Risk  (10/26/2021)  Depression (PHQ2-9): Low Risk  (08/13/2023)  Financial Resource Strain: Low Risk  (07/28/2023)  Physical Activity: Inactive (10/29/2019)  Social Connections: Patient Unable To Answer (09/18/2023)  Stress: No Stress Concern Present (10/29/2019)  Recent Concern: Stress - Stress Concern Present (09/08/2019)  Tobacco Use: Medium Risk (09/18/2023)  Health Literacy: Inadequate Health Literacy (07/28/2023)    Readmission Risk Interventions    07/01/2023    4:26 PM 09/11/2022    1:23 PM  Readmission Risk Prevention Plan  Transportation Screening Complete Complete  Medication Review (RN Care Manager) Complete Referral to Pharmacy  PCP or Specialist appointment within 3-5 days of discharge Complete Complete  HRI or Home Care Consult Complete Complete  SW Recovery Care/Counseling Consult Complete Complete  Palliative Care Screening Not Applicable Not Applicable  Skilled Nursing Facility Not Applicable Not Applicable

## 2023-09-28 NOTE — Plan of Care (Signed)

## 2023-09-29 ENCOUNTER — Telehealth: Payer: Self-pay

## 2023-09-29 ENCOUNTER — Ambulatory Visit: Admitting: Nurse Practitioner

## 2023-09-29 DIAGNOSIS — G9341 Metabolic encephalopathy: Secondary | ICD-10-CM | POA: Diagnosis not present

## 2023-09-29 LAB — GLUCOSE, CAPILLARY
Glucose-Capillary: 146 mg/dL — ABNORMAL HIGH (ref 70–99)
Glucose-Capillary: 186 mg/dL — ABNORMAL HIGH (ref 70–99)

## 2023-09-29 LAB — BASIC METABOLIC PANEL WITH GFR
Anion gap: 9 (ref 5–15)
BUN: 53 mg/dL — ABNORMAL HIGH (ref 8–23)
CO2: 22 mmol/L (ref 22–32)
Calcium: 8.8 mg/dL — ABNORMAL LOW (ref 8.9–10.3)
Chloride: 104 mmol/L (ref 98–111)
Creatinine, Ser: 3.07 mg/dL — ABNORMAL HIGH (ref 0.61–1.24)
GFR, Estimated: 19 mL/min — ABNORMAL LOW (ref >60)
Glucose, Bld: 126 mg/dL — ABNORMAL HIGH (ref 70–99)
Potassium: 5.1 mmol/L (ref 3.5–5.1)
Sodium: 135 mmol/L (ref 135–145)

## 2023-09-29 NOTE — Plan of Care (Signed)

## 2023-09-29 NOTE — Progress Notes (Signed)
 PROGRESS NOTE    [name]   FMW:[id number] DOB: June 29, 1933  DOA: 09/18/2023 Date of Service: 09/29/23 which is hospital day 11  PCP: Liana Fish, NP    Hospital course / significant events:   HPI: [name] is a 89 y.o. male with medical history significant of hypertension, hyperlipidemia, T2DM, permanent atrial fibrillation on edoxaban , SSS s/p PPM, HFrEF with EF of 45 to 50% -06/24/2023), V-Tach on amiodarone , stage IVb metastatic prostate cancer on Xtandi  and leuprolide  who presents to the emergency department from home via EMS due to 2-week onset of progressive worsening confusion.  Patient was unable to provide history, history was obtained from EDP, ED medical record and daughter-in-law at bedside.  Per report, patient lives at home with wife who has dementia and he was able to take care of his ADLs and care for his wife.  However, since past 2-3 weeks, family checked on him from time to time noted changes in cognition and several episodes of diarrhea.  It was also noted that he has not been taking his medications including amiodarone  as prescribed (he was taking more pills than he was supposed to), assumptions made based on number of pills in his pillbox.  His occupational therapist followed up with him and noted he was covered in stool and was more confused, so he requested for him to be taken to the ED for further evaluation and management.   Of note: was admitted from 6/2 to 6/6 due to monomorphic V. tach during which he underwent t LHC with Dr. Ammon (06/03) and revealed 40% stenosed Ost/prox LAD, mild LV systolic dysfunction. He was also admitted from 6/7 to 6/20 due to left MCA infarct with left M1 occlusion s/p mechanical thrombectomy with TICI3 revascularization, etiology likely A-fib on DOAC in the setting of recent cardioversion.   On this admission Encephalopathy likely multifactorial w/ recent stroke affecting mentation. MRI nothing acute, no anemia, no  electrolyte abnormalities, normal tsh, normal LFTs. Diarrhea/dehydration w/ AKI also might be contirbuting (+)Norovirus explains diarrhea  Hematochezia - appreciate GI input.  Patient had a flexible sigmoidoscopy which showed a rectal ulcer. Record review previously there was plan to stop anticoagulation but pt appears to have been taking this at home. Will dc this medication.  Renal function has continued to worsen, 09/26/23 trial fluids (low po intake) still worsening, not improving --> renal US , repeat UA, get postvoid residual, nephro consult - suspect advancing CKD? Per nephrology, ok to follow on discharge --> continues to be worse into 09/07 but better into 09/08, no change to plan per nephrology as UOP is ok, no need dialysis     Consultants:  Gastroenterology   Procedures/Surgeries: 09/23/23 flexible sigmoidoscopy       ASSESSMENT & PLAN:   Acute encephalopathy - improved/resolved  Likely multifactorial, recent stroke biggest contributor.  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 (baseline Cr 1.4-1.5 and GFR 45-48 in 06/2023 though previous hx noted CKD3b per notes)  Question progression to CKD3b/4 (Cr here has been 1.8-2 and GFR  30-35 --> Cr 2.9 and GFR 20 09/27/23 --> 3.18 and 18 09/28/23)  Secondary to GI losses from diarrhea, poor po intake vs possible advancing CKD Renal US  - medical renal disease as expected  monitor [facility] Nephrology consult -  should be ok to follow outpatient and no concern  for discharge / no need to start dialysis - monitoring into tomorrow 09/09 and if Cr improving should be ok for discharge at that time    Hx v-tach History of A-fib Has pacer Continue amiodarone  for rate control   T2DM Euglycemic Hold home farxiga  for now per nephrology - canlikely restart on discharge  SSI ac for now, of note pt will not do ok w/ taking insulin  / injectables at home per his POA so will hold insulin  for now but continue Glc checks fasting w/ AM labs    PAD Non-obstructive CAD Continue home statin Resume anticoag per daughter request/preference see above  HFrEF Last echo on file 06/24/2023 - LVEF of 45 to 50%, 2D echocardiogram which was done 06/24 Continue to hold home farxiga  w/ low BP holding lasix  for now d/t AKI/CKD, will diurese if clinically fluid up  holding metoprolol  d/t low BP Cautious fluids today given worsening renal function   Hx DVT Anticoagulant discontinued back in June per record review, daughter requests/prefers he is on this given CVA hx, see above    Metastatic prostate cancer Can resume home xtandi      COPD Quiescent/stable  incruse for home spiriva     overweight based on BMI: Body mass index is 27.34 kg/m.SABRA Significantly low or high BMI is associated with higher medical risk.  Underweight - under 18  overweight - 25 to 29 obese - 30 or more Class 1 obesity: BMI of 30.0 to 34 Class 2 obesity: BMI of 35.0 to 39 Class 3 obesity: BMI of 40.0 to 49 Super Morbid Obesity: BMI 50-59 Super-super Morbid Obesity: BMI 60+ Healthy nutrition and physical activity advised as adjunct to other disease management and risk reduction treatments   DVT prophylaxis: SCD IV fluids: no continuous IV fluids  Nutrition: cardiac/carb diet Central lines / other devices: none  Code Status: FULL CODE ACP documentation reviewed: HCPOA on file in [facility]  TOC needs: SNF placement will need re-do auth over the weekend  Medical barriers to dispo: renal  function - if improving/stable can plan for dc tomorrow             Subjective / Brief ROS:  Patient reports no concerns Denies CP/SOB.     Family Communication: spoke on phone w/ daughter in law / [place] Tiffany 09/29/23 3:03 PM     Objective Findings:  Vitals:   09/28/23 1634 09/28/23 2104 09/29/23 0536 09/29/23 0916  BP: 115/71 127/77 123/89 103/75  Pulse: 70 66 76 75  Resp: 18  16 17   Temp: 98 F (36.7 C) 97.9 F (36.6 C) (!) 97.3 F (36.3 C) 98 F (36.7 C)  TempSrc:      SpO2: 100% 99% 99% 98%  Weight:      Height:        Intake/Output Summary (Last 24 hours) at 09/29/2023 1503 Last data filed at 09/29/2023 1300 Gross per 24 hour  Intake 960 ml  Output 1500 ml  Net -540 ml   Filed Weights   09/18/23 1546  Weight: 94 kg    Examination:  Physical Exam Constitutional:      General: He is not in acute distress. Cardiovascular:     Rate  and Rhythm: Normal rate and regular rhythm.  Pulmonary:     Effort: Pulmonary effort is normal.     Breath sounds: Normal breath sounds.  Abdominal:     General: Bowel sounds are normal.     Tenderness: There is no abdominal tenderness.  Skin:    General: Skin is warm and dry.  Neurological:     Mental Status: He is alert.  Psychiatric:        Mood and Affect: Mood normal.        Behavior: Behavior normal.          Scheduled Medications:   amiodarone   200 mg Oral Daily   atorvastatin   10 mg Oral Daily   edoxaban   30 mg Oral Daily   enzalutamide   160 mg Oral Daily   feeding supplement (GLUCERNA SHAKE)  237 mL Oral TID BM   pantoprazole   40 mg Oral Daily   polyethylene glycol  17 g Oral Daily   umeclidinium bromide   1 puff Inhalation Daily   verapamil   120 mg Oral QHS    Continuous Infusions:    PRN Medications:  acetaminophen  **OR** acetaminophen , prochlorperazine   Antimicrobials from admission:  Anti-infectives (From admission, onward)    None           Data Reviewed:  I have  personally reviewed the following...  CBC: Recent Labs  Lab 09/24/23 0211 09/25/23 0517 09/28/23 0534  WBC 6.0 6.1 5.6  HGB 14.1 14.8 14.9  HCT 43.2 46.0 44.6  MCV 94.5 94.1 93.1  PLT 216 222 236   Basic Metabolic Panel: Recent Labs  Lab 09/22/23 1729 09/24/23 0211 09/25/23 0517 09/26/23 0459 09/27/23 0523 09/27/23 2303 09/28/23 0534 09/29/23 0332  NA 136   < > 138 135 136  --  136 135  K 4.7   < > 4.6 4.7 4.7  --  4.7 5.1  CL 102   < > 103 102 105  --  104 104  CO2 26   < > 25 25 22   --  21* 22  GLUCOSE 129*   < > 161* 157* 129*  --  109* 126*  BUN 41*   < > 32* 45* 48*  --  53* 53*  CREATININE 2.09*   < > 2.05* 2.83* 2.92*  --  3.18* 3.07*  CALCIUM  9.1   < > 9.0 8.9 8.7*  --  8.9 8.8*  MG 2.1  --   --   --   --  2.3  --   --    < > = values in this interval not displayed.   GFR: Estimated Creatinine Clearance: 18.8 mL/min (A) (by C-G formula based on SCr of 3.07 mg/dL (H)). Liver Function Tests: No results for input(s): AST, ALT, ALKPHOS, BILITOT, PROT, ALBUMIN  in the last 168 hours.  No results for input(s): LIPASE, AMYLASE in the last 168 hours. No results for input(s): AMMONIA in the last 168 hours. Coagulation Profile: No results for input(s): INR, PROTIME in the last 168 hours.  Cardiac Enzymes: No results for input(s): CKTOTAL, CKMB, CKMBINDEX, TROPONINI in the last 168 hours. BNP (last 3 results) No results for input(s): PROBNP in the last 8760 hours. HbA1C: No results for input(s): HGBA1C in the last 72 hours. CBG: Recent Labs  Lab 09/28/23 1135 09/28/23 1655 09/28/23 2159 09/29/23 0742 09/29/23 1157  GLUCAP 151* 84 129* 146* 186*   Lipid Profile: No results for input(s): CHOL, HDL, LDLCALC, TRIG, CHOLHDL, LDLDIRECT in the last 72 hours.  Thyroid  Function Tests: No results for input(s): TSH, T4TOTAL, FREET4, T3FREE, THYROIDAB in the last 72 hours. Anemia Panel: No results for input(s):  VITAMINB12, FOLATE, FERRITIN, TIBC, IRON, RETICCTPCT in the last 72 hours. Most Recent Urinalysis On File:     Component Value Date/Time   COLORURINE YELLOW (A) 09/18/2023 1554   APPEARANCEUR CLEAR (A) 09/18/2023 1554   APPEARANCEUR Clear 12/20/2021 1615   LABSPEC 1.013 09/18/2023 1554   LABSPEC 1.015 10/12/2013 1445   PHURINE 5.0 09/18/2023 1554   GLUCOSEU >=500 (A) 09/18/2023 1554   GLUCOSEU 50 mg/dL 90/77/7984 8554   HGBUR MODERATE (A) 09/18/2023 1554   BILIRUBINUR NEGATIVE 09/18/2023 1554   BILIRUBINUR Negative 12/20/2021 1615   BILIRUBINUR Negative 10/12/2013 1445   KETONESUR NEGATIVE 09/18/2023 1554   PROTEINUR NEGATIVE 09/18/2023 1554   UROBILINOGEN 0.2 06/29/2019 1556   NITRITE NEGATIVE 09/18/2023 1554   LEUKOCYTESUR NEGATIVE 09/18/2023 1554   LEUKOCYTESUR Negative 10/12/2013 1445   Sepsis Labs: @LABRCNTIP (procalcitonin:4,lacticidven:4) Microbiology: Recent Results (from the past 240 hours)  Resp panel by RT-PCR (RSV, Flu A&B, Covid) Anterior Nasal Swab     Status: None   Collection Time: 09/19/23  4:27 PM   Specimen: Anterior Nasal Swab  Result Value Ref Range Status   SARS Coronavirus 2 by RT PCR NEGATIVE NEGATIVE Final    Comment: (NOTE) SARS-CoV-2 target nucleic acids are NOT DETECTED.  The SARS-CoV-2 RNA is generally detectable in upper respiratory specimens during the acute phase of infection. The lowest concentration of SARS-CoV-2 viral copies this assay can detect is 138 copies/mL. A negative result does not preclude SARS-Cov-2 infection and should not be used as the sole basis for treatment or other patient management decisions. A negative result may occur with  improper specimen collection/handling, submission of specimen other than nasopharyngeal swab, presence of viral mutation(s) within the areas targeted by this assay, and inadequate number of viral copies(<138 copies/mL). A negative result must be combined with clinical observations,  patient history, and epidemiological information. The expected result is Negative.  Fact Sheet for Patients:  BloggerCourse.com  Fact Sheet for Healthcare Providers:  SeriousBroker.it  This test is no t yet approved or cleared by the [place]  FDA and  has been authorized for detection and/or diagnosis of SARS-CoV-2 by FDA under an Emergency Use Authorization (EUA). This EUA will remain  in effect (meaning this test can be used) for the duration of the COVID-19 declaration under Section 564(b)(1) of the Act, 21 U.S.C.section 360bbb-3(b)(1), unless the authorization is terminated  or revoked sooner.       Influenza A by PCR NEGATIVE NEGATIVE Final   Influenza B by PCR NEGATIVE NEGATIVE Final    Comment: (NOTE) The Xpert Xpress SARS-CoV-2/FLU/RSV plus assay is intended as an aid in the diagnosis of influenza from Nasopharyngeal swab specimens and should not be used as a sole basis for treatment. Nasal washings and aspirates are unacceptable for Xpert Xpress SARS-CoV-2/FLU/RSV testing.  Fact Sheet for Patients: BloggerCourse.com  Fact Sheet for Healthcare Providers: SeriousBroker.it  This test is not yet approved or cleared by the [place]  FDA and has been authorized for detection and/or diagnosis of SARS-CoV-2 by FDA under an Emergency Use Authorization (EUA). This EUA will remain in effect (meaning this test can be used) for the duration of the COVID-19 declaration under Section 564(b)(1) of the Act, 21 U.S.C. section 360bbb-3(b)(1), unless the authorization is terminated or revoked.     Resp Syncytial Virus by PCR NEGATIVE NEGATIVE Final    Comment: (NOTE) Fact Sheet  for Patients: BloggerCourse.com  Fact Sheet for Healthcare Providers: SeriousBroker.it  This test is not yet approved or cleared by the [place] FDA and has been authorized for detection and/or diagnosis of SARS-CoV-2 by FDA under an Emergency Use Authorization (EUA). This EUA will remain in effect (meaning this test can be used) for the duration of the COVID-19 declaration under Section 564(b)(1) of the Act, 21 U.S.C. section 360bbb-3(b)(1), unless the authorization is terminated or revoked.  Performed at [facility], [address] [place], [place] [address]   Gastrointestinal Panel by PCR , Stool     Status: Abnormal   Collection Time: 09/19/23  8:00 PM   Specimen: STOOL  Result Value Ref Range Status   Campylobacter species NOT DETECTED NOT DETECTED Final   Plesimonas shigelloides NOT DETECTED NOT DETECTED Final   Salmonella species NOT DETECTED NOT DETECTED Final   Yersinia enterocolitica NOT DETECTED NOT DETECTED Final   Vibrio species NOT DETECTED NOT DETECTED Final   Vibrio cholerae NOT DETECTED NOT DETECTED Final   Enteroaggregative E coli (EAEC) NOT DETECTED NOT DETECTED Final   Enteropathogenic E coli (EPEC) NOT DETECTED NOT DETECTED Final   Enterotoxigenic E coli (ETEC) NOT DETECTED NOT DETECTED Final   Shiga like toxin producing E coli (STEC) NOT DETECTED NOT DETECTED Final   Shigella/Enteroinvasive E coli (EIEC) NOT DETECTED NOT DETECTED Final   Cryptosporidium NOT DETECTED NOT DETECTED Final   Cyclospora cayetanensis NOT DETECTED NOT DETECTED Final   Entamoeba histolytica NOT DETECTED NOT DETECTED Final   Giardia lamblia NOT DETECTED NOT DETECTED Final   Adenovirus F40/41 NOT DETECTED NOT DETECTED Final   Astrovirus NOT DETECTED NOT DETECTED Final   Norovirus GI/GII DETECTED (A) NOT DETECTED Final    Comment: RESULT CALLED TO, READ BACK BY AND VERIFIED WITH: JOYYA BAYNUM, LPN @0437  09/20/2023 COP    Rotavirus A NOT DETECTED NOT DETECTED Final   Sapovirus (I, II, IV, and V) NOT DETECTED NOT DETECTED Final    Comment: Performed at [facility], [address] [place],  [place] [address]  C Difficile Quick Screen w PCR reflex     Status: None   Collection Time: 09/19/23  8:00 PM   Specimen: STOOL  Result Value Ref Range Status   C Diff antigen NEGATIVE NEGATIVE Final   C Diff toxin NEGATIVE NEGATIVE Final   C Diff interpretation No C. difficile detected.  Final    Comment: Performed at [facility], [address] [place], [place] [address]      Radiology Studies last 3 days: US  RENAL Result Date: 09/27/2023 CLINICAL DATA:  Acute on chronic kidney disease. EXAM: RENAL / URINARY TRACT ULTRASOUND COMPLETE COMPARISON:  September 18, 2023.  October 21, 2014. FINDINGS: Right Kidney: Renal measurements: 8.8 x 5.7 x 6.3 cm = volume: 164 mL. Increased echogenicity of renal parenchyma is noted suggesting medical renal disease. Lobulated cortical margins are noted consistent with scarring. Several echogenic foci are noted suggesting nephrolithiasis. No mass or hydronephrosis visualized. Left Kidney: Renal measurements: 9.9 x 6.9 x 3.8 cm = volume: 136 mL. Increased echogenicity of renal parenchyma is noted. Lobulated cortical margins are noted suggesting scarring. Several echogenic foci are noted suggesting nephrolithiasis. No mass or hydronephrosis visualized. Bladder: Appears normal for degree of bladder distention. Other: None. IMPRESSION: Increased echogenicity of renal parenchyma is noted bilaterally consistent with medical renal disease. No hydronephrosis or renal obstruction is noted. Bilateral nephrolithiasis is noted. Electronically Signed   By: Lynwood Landy Raddle  M.D.   On: 09/27/2023 11:14         Laneta Blunt, DO [facility] 09/29/2023, 3:03 PM    Dictation software may have been used to generate the above note. Typos may occur and escape review in typed/dictated notes. Please contact Dr Blunt directly for clarity if needed.  Staff may message me via secure chat in Epic  but this may not receive an immediate response,  please page me  for urgent matters!  If 7PM-7AM, please contact night coverage [email or web address]

## 2023-09-29 NOTE — Plan of Care (Signed)
 ?  Problem: Clinical Measurements: ?Goal: Ability to maintain clinical measurements within normal limits will improve ?Outcome: Progressing ?Goal: Will remain free from infection ?Outcome: Progressing ?Goal: Diagnostic test results will improve ?Outcome: Progressing ?  ?

## 2023-09-29 NOTE — Progress Notes (Signed)
 [facility]  ROUNDING NOTE   Subjective:   [name] is a 89 y.o. male with past medical history of atrial fibrillation, hypertension, aortic atherosclerosis, COPD, obstructive sleep apnea, CKD, osteoarthritis, diabetes, prostate cancer treated with Xtandi .  Patient presents to the emergency department for evaluation of left abdominal pain.  Patient has been admitted for Acute renal insufficiency [N28.9] Acute encephalopathy [G93.40] Skin breakdown [R23.8] Non compliance w medication regimen [Z91.148] Acute metabolic encephalopathy [G93.41]  Patient is known to our practice followed outpatient by Dr. Dennise.    Update:  Patient seen laying in bed No family present Alert and oriented to self only  Creatinine 3.07   Objective:  Vital signs in last 24 hours:  Temp:  [97.3 F (36.3 C)-98 F (36.7 C)] 98 F (36.7 C) (09/08 0916) Pulse Rate:  [66-76] 75 (09/08 0916) Resp:  [16-18] 17 (09/08 0916) BP: (103-127)/(71-89) 103/75 (09/08 0916) SpO2:  [98 %-100 %] 98 % (09/08 0916)  Weight change:  Filed Weights   09/18/23 1546  Weight: 94 kg    Intake/Output: I/O last 3 completed shifts: In: 480 [P.O.:480] Out: 2400 [Urine:2400]   Intake/Output this shift:  No intake/output data recorded.  Physical Exam: General: NAD, resting quietly   Head: Normocephalic, atraumatic. Moist oral mucosal membranes  Eyes: Anicteric  Lungs:  Clear to auscultation. Normal effort  Heart: Regular rate and rhythm  Abdomen:  Soft, nontender  Extremities:  No peripheral edema.  Neurologic: Awake and alert  Skin: Warm,dry, no rash       Basic Metabolic Panel: Recent Labs  Lab 09/22/23 1729 09/24/23 0211 09/25/23 0517 09/26/23 0459 09/27/23 0523 09/27/23 2303 09/28/23 0534 09/29/23 0332  NA 136   < > 138 135 136  --  136 135  K 4.7   < > 4.6 4.7 4.7  --  4.7 5.1  CL 102   < > 103 102 105  --  104 104  CO2 26   < > 25 25 22   --  21* 22  GLUCOSE 129*   < > 161*  157* 129*  --  109* 126*  BUN 41*   < > 32* 45* 48*  --  53* 53*  CREATININE 2.09*   < > 2.05* 2.83* 2.92*  --  3.18* 3.07*  CALCIUM  9.1   < > 9.0 8.9 8.7*  --  8.9 8.8*  MG 2.1  --   --   --   --  2.3  --   --    < > = values in this interval not displayed.    Liver Function Tests: No results for input(s): AST, ALT, ALKPHOS, BILITOT, PROT, ALBUMIN  in the last 168 hours.  No results for input(s): LIPASE, AMYLASE in the last 168 hours. No results for input(s): AMMONIA in the last 168 hours.  CBC: Recent Labs  Lab 09/22/23 1017 09/24/23 0211 09/25/23 0517 09/28/23 0534  WBC 5.6 6.0 6.1 5.6  HGB 13.5 14.1 14.8 14.9  HCT 41.7 43.2 46.0 44.6  MCV 95.0 94.5 94.1 93.1  PLT 188 216 222 236    Cardiac Enzymes: No results for input(s): CKTOTAL, CKMB, CKMBINDEX, TROPONINI in the last 168 hours.  BNP: Invalid input(s): POCBNP  CBG: Recent Labs  Lab 09/28/23 0808 09/28/23 1135 09/28/23 1655 09/28/23 2159 09/29/23 0742  GLUCAP 138* 151* 84 129* 146*    Microbiology: Results for orders placed or performed during the hospital encounter of 09/18/23  Urine Culture (for pregnant, neutropenic or urologic  patients or patients with an indwelling urinary catheter)     Status: None   Collection Time: 09/18/23  3:54 PM   Specimen: Urine, Clean Catch  Result Value Ref Range Status   Specimen Description   Final    URINE, CLEAN CATCH Performed at [facility], [address], [place], [place] [address]    Special Requests   Final    NONE Performed at [facility], [address] [place], [place] [address]    Culture   Final    NO GROWTH Performed at [facility], [address] [place], [place] [address]    Report Status 09/20/2023 FINAL  Final  Culture, blood (Routine X 2) w Reflex to ID Panel     Status: None   Collection Time: 09/19/23  2:48 PM   Specimen: BLOOD  Result Value Ref Range Status   Specimen Description    Final    BLOOD BLOOD LEFT ARM Performed at [facility], [address], [place], [place] [address]    Special Requests   Final    BOTTLES DRAWN AEROBIC AND ANAEROBIC Blood Culture adequate volume Performed at [facility], [address] [place], [place] [address]    Culture   Final    NO GROWTH 5 DAYS Performed at [facility], [address] [place], [place] [address]    Report Status 09/24/2023 FINAL  Final  Culture, blood (Routine X 2) w Reflex to ID Panel     Status: None   Collection Time: 09/19/23  2:48 PM   Specimen: BLOOD  Result Value Ref Range Status   Specimen Description   Final    BLOOD BLOOD LEFT HAND Performed at [facility], [address], [place], [place] [address]    Special Requests   Final    BOTTLES DRAWN AEROBIC AND ANAEROBIC Blood Culture adequate volume Performed at [facility], [address] [place], [place] [address]    Culture   Final    NO GROWTH 5 DAYS Performed at [facility], [address] [place], [place] [address]    Report Status 09/24/2023 FINAL  Final  Resp panel by RT-PCR (RSV, Flu A&B, Covid) Anterior Nasal Swab     Status: None   Collection Time: 09/19/23  4:27 PM   Specimen: Anterior Nasal Swab  Result Value Ref Range Status   SARS Coronavirus 2 by RT PCR NEGATIVE NEGATIVE Final    Comment: (NOTE) SARS-CoV-2 target nucleic acids are NOT DETECTED.  The SARS-CoV-2 RNA is generally detectable in upper respiratory specimens during the acute phase of infection. The lowest concentration of SARS-CoV-2 viral copies this assay can detect is 138 copies/mL. A negative result does not preclude SARS-Cov-2 infection and should not be used as the sole basis for treatment or other patient management decisions. A negative result may occur with  improper specimen collection/handling, submission of specimen other than nasopharyngeal swab, presence of viral mutation(s) within the areas  targeted by this assay, and inadequate number of viral copies(<138 copies/mL). A negative result must be combined with clinical observations, patient history, and epidemiological information. The expected result is Negative.  Fact Sheet for Patients:  BloggerCourse.com  Fact Sheet for Healthcare Providers:  SeriousBroker.it  This test is no t yet approved or cleared by the [place]  FDA and  has been authorized for detection and/or diagnosis of SARS-CoV-2 by FDA under an Emergency Use Authorization (EUA). This EUA will remain  in effect (meaning this test can be used) for the duration of the COVID-19 declaration under Section 564(b)(1) of the Act, 21 U.S.C.section 360bbb-3(b)(1), unless the authorization is terminated  or revoked sooner.       Influenza A by PCR NEGATIVE NEGATIVE Final   Influenza B by PCR NEGATIVE NEGATIVE Final    Comment: (NOTE) The Xpert Xpress SARS-CoV-2/FLU/RSV plus assay is intended as an aid in the diagnosis of influenza from Nasopharyngeal swab specimens and should not be used as a sole basis for treatment. Nasal washings and aspirates are unacceptable for Xpert Xpress SARS-CoV-2/FLU/RSV testing.  Fact Sheet for Patients: BloggerCourse.com  Fact Sheet for Healthcare Providers: SeriousBroker.it  This test is not yet approved or cleared by the [place]  FDA and has been authorized for detection and/or diagnosis of SARS-CoV-2 by FDA under an Emergency Use Authorization (EUA). This EUA will remain in effect (meaning this test can be used) for the duration of the COVID-19 declaration under Section 564(b)(1) of the Act, 21 U.S.C. section 360bbb-3(b)(1), unless the authorization is terminated or revoked.     Resp Syncytial Virus by PCR NEGATIVE NEGATIVE Final    Comment: (NOTE) Fact Sheet for  Patients: BloggerCourse.com  Fact Sheet for Healthcare Providers: SeriousBroker.it  This test is not yet approved or cleared by the [place]  FDA and has been authorized for detection and/or diagnosis of SARS-CoV-2 by FDA under an Emergency Use Authorization (EUA). This EUA will remain in effect (meaning this test can be used) for the duration of the COVID-19 declaration under Section 564(b)(1) of the Act, 21 U.S.C. section 360bbb-3(b)(1), unless the authorization is terminated or revoked.  Performed at [facility], [address] [place], [place] [address]   Gastrointestinal Panel by PCR , Stool     Status: Abnormal   Collection Time: 09/19/23  8:00 PM   Specimen: STOOL  Result Value Ref Range Status   Campylobacter species NOT DETECTED NOT DETECTED Final   Plesimonas shigelloides NOT DETECTED NOT DETECTED Final   Salmonella species NOT DETECTED NOT DETECTED Final   Yersinia enterocolitica NOT DETECTED NOT DETECTED Final   Vibrio species NOT DETECTED NOT DETECTED Final   Vibrio cholerae NOT DETECTED NOT DETECTED Final   Enteroaggregative E coli (EAEC) NOT DETECTED NOT DETECTED Final   Enteropathogenic E coli (EPEC) NOT DETECTED NOT DETECTED Final   Enterotoxigenic E coli (ETEC) NOT DETECTED NOT DETECTED Final   Shiga like toxin producing E coli (STEC) NOT DETECTED NOT DETECTED Final   Shigella/Enteroinvasive E coli (EIEC) NOT DETECTED NOT DETECTED Final   Cryptosporidium NOT DETECTED NOT DETECTED Final   Cyclospora cayetanensis NOT DETECTED NOT DETECTED Final   Entamoeba histolytica NOT DETECTED NOT DETECTED Final   Giardia lamblia NOT DETECTED NOT DETECTED Final   Adenovirus F40/41 NOT DETECTED NOT DETECTED Final   Astrovirus NOT DETECTED NOT DETECTED Final   Norovirus GI/GII DETECTED (A) NOT DETECTED Final    Comment: RESULT CALLED TO, READ BACK BY AND VERIFIED WITH: JOYYA BAYNUM, LPN @0437  09/20/2023 COP     Rotavirus A NOT DETECTED NOT DETECTED Final   Sapovirus (I, II, IV, and V) NOT DETECTED NOT DETECTED Final    Comment: Performed at [facility], [address] [place], [place] [address]  C Difficile Quick Screen w PCR reflex     Status: None   Collection Time: 09/19/23  8:00 PM   Specimen: STOOL  Result Value Ref Range Status   C Diff antigen NEGATIVE NEGATIVE Final  C Diff toxin NEGATIVE NEGATIVE Final   C Diff interpretation No C. difficile detected.  Final    Comment: Performed at [facility], [address] [place], [place] [address]   *Note: Due to a large number of results and/or encounters for the requested time period, some results have not been displayed. A complete set of results can be found in Results Review.    Coagulation Studies: No results for input(s): LABPROT, INR in the last 72 hours.  Urinalysis: No results for input(s): COLORURINE, LABSPEC, PHURINE, GLUCOSEU, HGBUR, BILIRUBINUR, KETONESUR, PROTEINUR, UROBILINOGEN, NITRITE, LEUKOCYTESUR in the last 72 hours.  Invalid input(s): APPERANCEUR     Imaging: No results found.     Medications:      amiodarone   200 mg Oral Daily   atorvastatin   10 mg Oral Daily   edoxaban   30 mg Oral Daily   enzalutamide   160 mg Oral Daily   feeding supplement (GLUCERNA SHAKE)  237 mL Oral TID BM   insulin  aspart  0-9 Units Subcutaneous TID WC   pantoprazole   40 mg Oral Daily   polyethylene glycol  17 g Oral Daily   umeclidinium bromide   1 puff Inhalation Daily   verapamil   120 mg Oral QHS   acetaminophen  **OR** acetaminophen , prochlorperazine   Assessment/ Plan:  [name] is a 89 y.o.  male with past medical history of atrial fibrillation, hypertension, aortic atherosclerosis, COPD, obstructive sleep apnea, CKD, osteoarthritis, diabetes, prostate cancer treated with Xtandi .  Patient presents to the emergency department for evaluation of left abdominal pain.   Patient has been admitted for Acute renal insufficiency [N28.9] Acute encephalopathy [G93.40] Skin breakdown [R23.8] Non compliance w medication regimen [Z91.148] Acute metabolic encephalopathy [G93.41]   Acute Kidney Injury on chronic kidney disease stage IIIa with baseline creatinine 1.40 and GFR of 48 on 07/06/23.  Acute kidney injury secondary poor oral intake from confusion. Chronic kidney disease is secondary to hypertension and diabetes CT abd pelvis shows bilateral kidney stones without obstruction. No recent IV contrast exposure. Has received IV fluids during this admission.   Creatinine improved today, 3.07. UOP 1.5L. Would like see creatinine decrease tomorrow before clearing patient to discharge. He will need to follow up in our office in 1-2 weeks.   Lab Results  Component Value Date   CREATININE 3.07 (H) 09/29/2023   CREATININE 3.18 (H) 09/28/2023   CREATININE 2.92 (H) 09/27/2023    Intake/Output Summary (Last 24 hours) at 09/29/2023 1004 Last data filed at 09/29/2023 0538 Gross per 24 hour  Intake 240 ml  Output 1500 ml  Net -1260 ml   2. Anemia of chronic kidney disease Lab Results  Component Value Date   HGB 14.9 09/28/2023    Will continue to monitor.   3. Hypertension with chronic kidney disease. Home regimen includes metoprolol  and verapamil . These have been resumed during this admission. Blood pressure 103/75, decreased but acceptable. Continue to monitor.   4. Secondary Hyperparathyroidism: with outpatient labs: PTH 1.2, phosphorus 3.5, calcium  9.9 on 02/26/23.   Lab Results  Component Value Date   CALCIUM  8.8 (L) 09/29/2023   CAION 1.22 06/28/2023   PHOS 3.0 09/19/2023    Calcium  acceptable.    LOS: 11 Olyver Hawes 9/8/202510:04 AM

## 2023-09-29 NOTE — Progress Notes (Signed)
 Physical Therapy Treatment Patient Details Name: [name] MRN: [id number] DOB: 1933-08-17 Today's Date: 09/29/2023   History of Present Illness [name] is a 89 y.o. male with medical history significant of hypertension, hyperlipidemia, T2DM, permanent atrial fibrillation on edoxaban , SSS s/p PPM, HFrEF with EF of 45 to 50% -06/24/2023), V-Tach on amiodarone , stage IVb metastatic prostate cancer on Xtandi  and leuprolide  who presents to the emergency department from home via EMS due to 2-week onset of progressive worsening confusion.    PT Comments  Pt able to ambulate 26ft with min A for RW management and take bwd steps to recliner however continues to demonstrate poor cognition. Further ambulation not tested for safety. Pt has tendency to push RW far and requires multi-modal cuing for sequencing/redirection. Pt demonstrates multiple STS with min A from recliner for peri care; total A for peri care. Pt demonstrates forward flexed posture with standing and is able to correct with verbal and visual cuing.    If plan is discharge home, recommend the following: A little help with walking and/or transfers;A little help with bathing/dressing/bathroom;Assistance with cooking/housework;Direct supervision/assist for financial management;Supervision due to cognitive status;Assist for transportation;Direct supervision/assist for medications management;Help with stairs or ramp for entrance   Can travel by private vehicle     Yes  Equipment Recommendations  Other (comment) (TBD at next venue)    Recommendations for Other Services       Precautions / Restrictions Precautions Precautions: Fall Recall of Precautions/Restrictions: Impaired Restrictions Weight Bearing Restrictions Per Provider Order: No     Mobility  Bed Mobility Overal bed mobility: Needs Assistance             General bed mobility comments: in recliner pre and post session    Transfers Overall transfer level:  Needs assistance Equipment used: Rolling walker (2 wheels) Transfers: Sit to/from Stand Sit to Stand: Min assist           General transfer comment: Multiple STS this session. Verbal cuing for hand placement. Min A for STS. Total A for pericare d/t incontinence    Ambulation/Gait Ambulation/Gait assistance: Min assist Gait Distance (Feet): 4 Feet Assistive device: Rolling walker (2 wheels) Gait Pattern/deviations: Step-to pattern, Trunk flexed       General Gait Details: Min A for RW management. Pt has tendency to push the RW too far. Multimodal cuing for re-direction and for sequencing.   Stairs             Wheelchair Mobility     Tilt Bed    Modified Rankin (Stroke Patients Only)       Balance Overall balance assessment: Needs assistance Sitting-balance support: No upper extremity supported, Feet supported Sitting balance-Leahy Scale: Good Sitting balance - Comments: Able to maintain seated balance at recliner   Standing balance support: Bilateral upper extremity supported, During functional activity Standing balance-Leahy Scale: Poor Standing balance comment: heavy BUE support d/t forward flexed posture with standing. Pt able to correct with verbal and visual cuing. No LOB with multiple STSs.                            Communication Communication Communication: Impaired Factors Affecting Communication: Difficulty expressing self  Cognition Arousal: Alert Behavior During Therapy: Flat affect   PT - Cognitive impairments: History of cognitive impairments                       PT - Cognition Comments:  Agreeable to PT session. Follows commands very inconsistently and with increased time despite multimodal cuing Following commands: Impaired Following commands impaired: Only follows one step commands consistently, Follows one step commands with increased time    Cueing Cueing Techniques: Verbal cues, Tactile cues, Visual cues   Exercises Other Exercises Other Exercises: Assistance with peri care and change of linens in chair. Min A for STS. Total A for pericare.    General Comments        Pertinent Vitals/Pain Pain Assessment Pain Assessment: No/denies pain    Home Living                          Prior Function            PT Goals (current goals can now be found in the care plan section) Acute Rehab PT Goals Patient Stated Goal: None stated PT Goal Formulation: Patient unable to participate in goal setting Time For Goal Achievement: 10/03/23 Progress towards PT goals: Progressing toward goals    Frequency    Min 2X/week      PT Plan      Co-evaluation              AM-PAC PT 6 Clicks Mobility   Outcome Measure  Help needed turning from your back to your side while in a flat bed without using bedrails?: A Little Help needed moving from lying on your back to sitting on the side of a flat bed without using bedrails?: A Little Help needed moving to and from a bed to a chair (including a wheelchair)?: A Little Help needed standing up from a chair using your arms (e.g., wheelchair or bedside chair)?: A Little Help needed to walk in hospital room?: A Lot Help needed climbing 3-5 steps with a railing? : Total 6 Click Score: 15    End of Session Equipment Utilized During Treatment: Gait belt Activity Tolerance: Patient tolerated treatment well Patient left: in chair;with call bell/phone within reach;with chair alarm set Nurse Communication: Mobility status PT Visit Diagnosis: Unsteadiness on feet (R26.81);Other abnormalities of gait and mobility (R26.89);Muscle weakness (generalized) (M62.81)     Time: 8894-8871 PT Time Calculation (min) (ACUTE ONLY): 23 min  Charges:                            Camari Wisham, SPT    Cacey Willow 09/29/2023, 12:25 PM

## 2023-09-29 NOTE — Progress Notes (Signed)
   09/29/2023  Patient ID: [name], male   DOB: May 13, 1933, 89 y.o.   MRN: [id number]  Patient was followed up for diabetes management, but he is currently in the hospital. Will follow up next week.   Dorcas Solian, PharmD Clinical Pharmacist Cell: [phone]

## 2023-09-30 DIAGNOSIS — G9341 Metabolic encephalopathy: Secondary | ICD-10-CM | POA: Diagnosis not present

## 2023-09-30 LAB — BASIC METABOLIC PANEL WITH GFR
Anion gap: 13 (ref 5–15)
BUN: 53 mg/dL — ABNORMAL HIGH (ref 8–23)
CO2: 23 mmol/L (ref 22–32)
Calcium: 9.1 mg/dL (ref 8.9–10.3)
Chloride: 103 mmol/L (ref 98–111)
Creatinine, Ser: 3 mg/dL — ABNORMAL HIGH (ref 0.61–1.24)
GFR, Estimated: 19 mL/min — ABNORMAL LOW (ref >60)
Glucose, Bld: 139 mg/dL — ABNORMAL HIGH (ref 70–99)
Potassium: 4.7 mmol/L (ref 3.5–5.1)
Sodium: 139 mmol/L (ref 135–145)

## 2023-09-30 NOTE — Progress Notes (Signed)
 Family was called to bring chemotherapy medication for patient.

## 2023-09-30 NOTE — Progress Notes (Signed)
 PROGRESS NOTE    [name]   FMW:[id number] DOB: 01/12/1934  DOA: 09/18/2023 Date of Service: 09/30/23 which is hospital day 12  PCP: Liana Fish, NP    Hospital course / significant events:   HPI: [name] is a 89 y.o. male with medical history significant of hypertension, hyperlipidemia, T2DM, permanent atrial fibrillation on edoxaban , SSS s/p PPM, HFrEF with EF of 45 to 50% -06/24/2023), V-Tach on amiodarone , stage IVb metastatic prostate cancer on Xtandi  and leuprolide  who presents to the emergency department from home via EMS due to 2-week onset of progressive worsening confusion.  Patient was unable to provide history, history was obtained from EDP, ED medical record and daughter-in-law at bedside.  Per report, patient lives at home with wife who has dementia and he was able to take care of his ADLs and care for his wife.  However, since past 2-3 weeks, family checked on him from time to time noted changes in cognition and several episodes of diarrhea.  It was also noted that he has not been taking his medications including amiodarone  as prescribed (he was taking more pills than he was supposed to), assumptions made based on number of pills in his pillbox.  His occupational therapist followed up with him and noted he was covered in stool and was more confused, so he requested for him to be taken to the ED for further evaluation and management.   Of note: was admitted from 6/2 to 6/6 due to monomorphic V. tach during which he underwent t LHC with Dr. Ammon (06/03) and revealed 40% stenosed Ost/prox LAD, mild LV systolic dysfunction. He was also admitted from 6/7 to 6/20 due to left MCA infarct with left M1 occlusion s/p mechanical thrombectomy with TICI3 revascularization, etiology likely A-fib on DOAC in the setting of recent cardioversion.   On this admission Encephalopathy likely multifactorial w/ recent stroke affecting mentation. MRI nothing acute, no anemia, no  electrolyte abnormalities, normal tsh, normal LFTs. Diarrhea/dehydration w/ AKI also might be contirbuting (+)Norovirus explains diarrhea  Hematochezia - appreciate GI input.  Patient had a flexible sigmoidoscopy which showed a rectal ulcer. Record review previously there was plan to stop anticoagulation but pt appears to have been taking this at home. Will dc this medication.  Renal function has continued to worsen, 09/26/23 trial fluids (low po intake) still worsening, not improving --> renal US , repeat UA, get postvoid residual, nephro consult - suspect advancing CKD? Per nephrology, ok to follow on discharge --> continues to be worse into 09/07 but better into 09/08, no change to plan per nephrology as UOP is ok, no need dialysis   09/30/23 [facility] can not take him until tomorrow    Consultants:  Gastroenterology   Procedures/Surgeries: 09/23/23 flexible sigmoidoscopy       ASSESSMENT & PLAN:   Acute encephalopathy - improved/resolved  Likely multifactorial, recent stroke biggest contributor.  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 (baseline Cr 1.4-1.5 and GFR 45-48 in 06/2023 though previous hx noted CKD3b per notes)  Question  progression to CKD3b/4   monitor BMP Nephrology consult -  ok to follow outpatient and no concern for discharge / no need to start dialysis Since here another night will repeat BMP in AM to make sure no serious change, would monitor in couple days at SNF    Hx  v-tach History of A-fib Has pacer Continue amiodarone  for rate control   T2DM Euglycemic Hold home farxiga  for now per nephrology - can restart on discharge if renal function stable/improving    PAD Non-obstructive CAD Continue home statin Resume anticoag per daughter request/preference see above  HFrEF Last echo on file 06/24/2023 - LVEF of 45 to 50%, 2D echocardiogram which was done 06/24 Held home farxiga  w/ low BP - can restart this on DC if renal fxn stable / BP ok  holding lasix  for now d/t AKI/CKD, will diurese if clinically fluid up  dc metoprolol  d/t low BP   Hx DVT Anticoagulant discontinued back in June per record review, daughter requests/prefers he is on this given CVA hx, see above    Metastatic prostate cancer Can resume home xtandi      COPD Quiescent/stable  incruse for home spiriva     overweight based on BMI: Body mass index is 27.34 kg/m.SABRA Significantly low or high BMI is associated with higher medical risk.  Underweight - under 18  overweight - 25 to 29 obese - 30 or more Class 1 obesity: BMI of 30.0 to 34 Class 2 obesity: BMI of 35.0 to 39 Class 3 obesity: BMI of 40.0 to 49 Super Morbid Obesity: BMI 50-59 Super-super Morbid Obesity: BMI 60+ Healthy nutrition and physical activity advised as adjunct to other disease management and risk reduction treatments   DVT prophylaxis: SCD IV fluids: no continuous IV fluids  Nutrition: cardiac/carb diet Central lines / other devices: none  Code Status: FULL CODE ACP documentation reviewed: HCPOA on file in [facility]  [facility] needs: SNF placement Medical barriers to dispo: none as of morning 09/30/23             Subjective / Brief ROS:  Patient reports no concerns Denies CP/SOB.     Family Communication: spoke on phone w/ daughter in law / [place] Tiffany 09/30/23 4:45 PM     Objective Findings:  Vitals:   09/29/23 2011 09/30/23 0402 09/30/23 0910 09/30/23 1428  BP: 129/87 99/70 106/67 124/77   Pulse: 81 70 77 80  Resp: 19 18 18 18   Temp: 97.7 F (36.5 C) 97.7 F (36.5 C) (!) 97.4 F (36.3 C) (!) 97.4 F (36.3 C)  TempSrc: Oral Oral    SpO2: 100% 98% 98% 99%  Weight:      Height:        Intake/Output Summary (Last 24 hours) at 09/30/2023 1645 Last data filed at 09/30/2023 1333 Gross per 24 hour  Intake 900 ml  Output --  Net 900 ml   Filed Weights   09/18/23 1546  Weight: 94 kg    Examination:  Physical Exam Constitutional:      General: He is not in acute distress. Cardiovascular:     Rate and Rhythm: Normal rate and regular rhythm.  Pulmonary:     Effort: Pulmonary effort is normal.     Breath sounds: Normal breath sounds.  Abdominal:     General: Bowel sounds are normal.     Tenderness: There is no abdominal tenderness.  Skin:    General: Skin is warm and dry.  Neurological:     Mental Status: He  is alert.  Psychiatric:        Mood and Affect: Mood normal.        Behavior: Behavior normal.          Scheduled Medications:   amiodarone   200 mg Oral Daily   atorvastatin   10 mg Oral Daily   edoxaban   30 mg Oral Daily   enzalutamide   160 mg Oral Daily   feeding supplement (GLUCERNA SHAKE)  237 mL Oral TID BM   pantoprazole   40 mg Oral Daily   polyethylene glycol  17 g Oral Daily   umeclidinium bromide   1 puff Inhalation Daily   verapamil   120 mg Oral QHS    Continuous Infusions:    PRN Medications:  acetaminophen  **OR** acetaminophen , prochlorperazine   Antimicrobials from admission:  Anti-infectives (From admission, onward)    None           Data Reviewed:  I have personally reviewed the following...  CBC: Recent Labs  Lab 09/24/23 0211 09/25/23 0517 09/28/23 0534  WBC 6.0 6.1 5.6  HGB 14.1 14.8 14.9  HCT 43.2 46.0 44.6  MCV 94.5 94.1 93.1  PLT 216 222 236   Basic Metabolic Panel: Recent Labs  Lab 09/26/23 0459 09/27/23 0523 09/27/23 2303 09/28/23 0534 09/29/23 0332 09/30/23 0703  NA 135 136  --  136 135  139  K 4.7 4.7  --  4.7 5.1 4.7  CL 102 105  --  104 104 103  CO2 25 22  --  21* 22 23  GLUCOSE 157* 129*  --  109* 126* 139*  BUN 45* 48*  --  53* 53* 53*  CREATININE 2.83* 2.92*  --  3.18* 3.07* 3.00*  CALCIUM  8.9 8.7*  --  8.9 8.8* 9.1  MG  --   --  2.3  --   --   --    GFR: Estimated Creatinine Clearance: 19.2 mL/min (A) (by C-G formula based on SCr of 3 mg/dL (H)). Liver Function Tests: No results for input(s): AST, ALT, ALKPHOS, BILITOT, PROT, ALBUMIN  in the last 168 hours.  No results for input(s): LIPASE, AMYLASE in the last 168 hours. No results for input(s): AMMONIA in the last 168 hours. Coagulation Profile: No results for input(s): INR, PROTIME in the last 168 hours.  Cardiac Enzymes: No results for input(s): CKTOTAL, CKMB, CKMBINDEX, TROPONINI in the last 168 hours. BNP (last 3 results) No results for input(s): PROBNP in the last 8760 hours. HbA1C: No results for input(s): HGBA1C in the last 72 hours. CBG: Recent Labs  Lab 09/28/23 1135 09/28/23 1655 09/28/23 2159 09/29/23 0742 09/29/23 1157  GLUCAP 151* 84 129* 146* 186*   Lipid Profile: No results for input(s): CHOL, HDL, LDLCALC, TRIG, CHOLHDL, LDLDIRECT in the last 72 hours. Thyroid  Function Tests: No results for input(s): TSH, T4TOTAL, FREET4, T3FREE, THYROIDAB in the last 72 hours. Anemia Panel: No results for input(s): VITAMINB12, FOLATE, FERRITIN, TIBC, IRON, RETICCTPCT in the last 72 hours. Most Recent Urinalysis On File:     Component Value Date/Time   COLORURINE YELLOW (A) 09/18/2023 1554   APPEARANCEUR CLEAR (A) 09/18/2023 1554   APPEARANCEUR Clear 12/20/2021 1615   LABSPEC 1.013 09/18/2023 1554   LABSPEC 1.015 10/12/2013 1445   PHURINE 5.0 09/18/2023 1554   GLUCOSEU >=500 (A) 09/18/2023 1554   GLUCOSEU 50 mg/dL 90/77/7984 8554   HGBUR MODERATE (A) 09/18/2023 1554   BILIRUBINUR NEGATIVE 09/18/2023 1554   BILIRUBINUR  Negative 12/20/2021 1615   BILIRUBINUR Negative 10/12/2013 1445   KETONESUR  NEGATIVE 09/18/2023 1554   PROTEINUR NEGATIVE 09/18/2023 1554   UROBILINOGEN 0.2 06/29/2019 1556   NITRITE NEGATIVE 09/18/2023 1554   LEUKOCYTESUR NEGATIVE 09/18/2023 1554   LEUKOCYTESUR Negative 10/12/2013 1445   Sepsis Labs: @LABRCNTIP (procalcitonin:4,lacticidven:4) Microbiology: No results found for this or any previous visit (from the past 240 hours).     Radiology Studies last 3 days: US  RENAL Result Date: 09/27/2023 CLINICAL DATA:  Acute on chronic kidney disease. EXAM: RENAL / URINARY TRACT ULTRASOUND COMPLETE COMPARISON:  September 18, 2023.  October 21, 2014. FINDINGS: Right Kidney: Renal measurements: 8.8 x 5.7 x 6.3 cm = volume: 164 mL. Increased echogenicity of renal parenchyma is noted suggesting medical renal disease. Lobulated cortical margins are noted consistent with scarring. Several echogenic foci are noted suggesting nephrolithiasis. No mass or hydronephrosis visualized. Left Kidney: Renal measurements: 9.9 x 6.9 x 3.8 cm = volume: 136 mL. Increased echogenicity of renal parenchyma is noted. Lobulated cortical margins are noted suggesting scarring. Several echogenic foci are noted suggesting nephrolithiasis. No mass or hydronephrosis visualized. Bladder: Appears normal for degree of bladder distention. Other: None. IMPRESSION: Increased echogenicity of renal parenchyma is noted bilaterally consistent with medical renal disease. No hydronephrosis or renal obstruction is noted. Bilateral nephrolithiasis is noted. Electronically Signed   By: Lynwood Landy Raddle M.D.   On: 09/27/2023 11:14         Laneta Blunt, DO [facility] 09/30/2023, 4:45 PM    Dictation software may have been used to generate the above note. Typos may occur and escape review in typed/dictated notes. Please contact Dr Blunt directly for clarity if needed.  Staff may message me via secure chat in Epic  but this  may not receive an immediate response,  please page me for urgent matters!  If 7PM-7AM, please contact night coverage [email or web address]

## 2023-09-30 NOTE — Progress Notes (Signed)
 [facility]  ROUNDING NOTE   Subjective:   [name] is a 89 y.o. male with past medical history of atrial fibrillation, hypertension, aortic atherosclerosis, COPD, obstructive sleep apnea, CKD, osteoarthritis, diabetes, prostate cancer treated with Xtandi .  Patient presents to the emergency department for evaluation of left abdominal pain.  Patient has been admitted for Acute renal insufficiency [N28.9] Acute encephalopathy [G93.40] Skin breakdown [R23.8] Non compliance w medication regimen [Z91.148] Acute metabolic encephalopathy [G93.41]  Patient is known to our practice followed outpatient by Dr. Dennise.    Update:  Patient seen laying in bed Alert and oriented to self Denies pain  Creatinine 3.00   Objective:  Vital signs in last 24 hours:  Temp:  [97.4 F (36.3 C)-97.9 F (36.6 C)] 97.4 F (36.3 C) (09/09 0910) Pulse Rate:  [70-91] 77 (09/09 0910) Resp:  [18-19] 18 (09/09 0910) BP: (99-129)/(67-88) 106/67 (09/09 0910) SpO2:  [98 %-100 %] 98 % (09/09 0910)  Weight change:  Filed Weights   09/18/23 1546  Weight: 94 kg    Intake/Output: I/O last 3 completed shifts: In: 720 [P.O.:720] Out: 950 [Urine:950]   Intake/Output this shift:  Total I/O In: 480 [P.O.:480] Out: -   Physical Exam: General: NAD, resting quietly   Head: Normocephalic, atraumatic. Moist oral mucosal membranes  Eyes: Anicteric  Lungs:  Clear to auscultation. Normal effort  Heart: Regular rate and rhythm  Abdomen:  Soft, nontender  Extremities:  No peripheral edema.  Neurologic: Awake and alert  Skin: Warm,dry, no rash       Basic Metabolic Panel: Recent Labs  Lab 09/26/23 0459 09/27/23 0523 09/27/23 2303 09/28/23 0534 09/29/23 0332 09/30/23 0703  NA 135 136  --  136 135 139  K 4.7 4.7  --  4.7 5.1 4.7  CL 102 105  --  104 104 103  CO2 25 22  --  21* 22 23  GLUCOSE 157* 129*  --  109* 126* 139*  BUN 45* 48*  --  53* 53* 53*  CREATININE 2.83* 2.92*  --   3.18* 3.07* 3.00*  CALCIUM  8.9 8.7*  --  8.9 8.8* 9.1  MG  --   --  2.3  --   --   --     Liver Function Tests: No results for input(s): AST, ALT, ALKPHOS, BILITOT, PROT, ALBUMIN  in the last 168 hours.  No results for input(s): LIPASE, AMYLASE in the last 168 hours. No results for input(s): AMMONIA in the last 168 hours.  CBC: Recent Labs  Lab 09/24/23 0211 09/25/23 0517 09/28/23 0534  WBC 6.0 6.1 5.6  HGB 14.1 14.8 14.9  HCT 43.2 46.0 44.6  MCV 94.5 94.1 93.1  PLT 216 222 236    Cardiac Enzymes: No results for input(s): CKTOTAL, CKMB, CKMBINDEX, TROPONINI in the last 168 hours.  BNP: Invalid input(s): POCBNP  CBG: Recent Labs  Lab 09/28/23 1135 09/28/23 1655 09/28/23 2159 09/29/23 0742 09/29/23 1157  GLUCAP 151* 84 129* 146* 186*    Microbiology: Results for orders placed or performed during the hospital encounter of 09/18/23  Urine Culture (for pregnant, neutropenic or urologic patients or patients with an indwelling urinary catheter)     Status: None   Collection Time: 09/18/23  3:54 PM   Specimen: Urine, Clean Catch  Result Value Ref Range Status   Specimen Description   Final    URINE, CLEAN CATCH Performed at [facility], [address], [place], [place] [address]    Special Requests  Final    NONE Performed at [facility], [address] [place], [place] [address]    Culture   Final    NO GROWTH Performed at [facility], [address] [place], [place] [address]    Report Status 09/20/2023 FINAL  Final  Culture, blood (Routine X 2) w Reflex to ID Panel     Status: None   Collection Time: 09/19/23  2:48 PM   Specimen: BLOOD  Result Value Ref Range Status   Specimen Description   Final    BLOOD BLOOD LEFT ARM Performed at [facility], [address], [place], [place] [address]    Special Requests   Final    BOTTLES DRAWN AEROBIC AND ANAEROBIC Blood Culture adequate  volume Performed at [facility], [address], [place], [place] [address]    Culture   Final    NO GROWTH 5 DAYS Performed at [facility], [address], [place], [place] [address]    Report Status 09/24/2023 FINAL  Final  Culture, blood (Routine X 2) w Reflex to ID Panel     Status: None   Collection Time: 09/19/23  2:48 PM   Specimen: BLOOD  Result Value Ref Range Status   Specimen Description   Final    BLOOD BLOOD LEFT HAND Performed at [facility], [address] [place], [place] [address]    Special Requests   Final    BOTTLES DRAWN AEROBIC AND ANAEROBIC Blood Culture adequate volume Performed at [facility], [address] [place], [place] [address]    Culture   Final    NO GROWTH 5 DAYS Performed at [facility], [address] [place], [place] [address]    Report Status 09/24/2023 FINAL  Final  Resp panel by RT-PCR (RSV, Flu A&B, Covid) Anterior Nasal Swab     Status: None   Collection Time: 09/19/23  4:27 PM   Specimen: Anterior Nasal Swab  Result Value Ref Range Status   SARS Coronavirus 2 by RT PCR NEGATIVE NEGATIVE Final    Comment: (NOTE) SARS-CoV-2 target nucleic acids are NOT DETECTED.  The SARS-CoV-2 RNA is generally detectable in upper respiratory specimens during the acute phase of infection. The lowest concentration of SARS-CoV-2 viral copies this assay can detect is 138 copies/mL. A negative result does not preclude SARS-Cov-2 infection and should not be used as the sole basis for treatment or other patient management decisions. A negative result may occur with  improper specimen collection/handling, submission of specimen other than nasopharyngeal swab, presence of viral mutation(s) within the areas targeted by this assay, and inadequate number of viral copies(<138 copies/mL). A negative result must be combined with clinical observations, patient history, and epidemiological information. The expected  result is Negative.  Fact Sheet for Patients:  BloggerCourse.com  Fact Sheet for Healthcare Providers:  SeriousBroker.it  This test is no t yet approved or cleared by the [place]  FDA and  has been authorized for detection and/or diagnosis of SARS-CoV-2 by FDA under an Emergency Use Authorization (EUA). This EUA will remain  in effect (meaning this test can be used) for the duration of the COVID-19 declaration under Section 564(b)(1) of the Act, 21 U.S.C.section 360bbb-3(b)(1), unless the authorization is terminated  or revoked sooner.       Influenza A by PCR NEGATIVE NEGATIVE Final   Influenza B by PCR NEGATIVE NEGATIVE Final    Comment: (NOTE) The Xpert Xpress SARS-CoV-2/FLU/RSV plus  assay is intended as an aid in the diagnosis of influenza from Nasopharyngeal swab specimens and should not be used as a sole basis for treatment. Nasal washings and aspirates are unacceptable for Xpert Xpress SARS-CoV-2/FLU/RSV testing.  Fact Sheet for Patients: BloggerCourse.com  Fact Sheet for Healthcare Providers: SeriousBroker.it  This test is not yet approved or cleared by the [place]  FDA and has been authorized for detection and/or diagnosis of SARS-CoV-2 by FDA under an Emergency Use Authorization (EUA). This EUA will remain in effect (meaning this test can be used) for the duration of the COVID-19 declaration under Section 564(b)(1) of the Act, 21 U.S.C. section 360bbb-3(b)(1), unless the authorization is terminated or revoked.     Resp Syncytial Virus by PCR NEGATIVE NEGATIVE Final    Comment: (NOTE) Fact Sheet for Patients: BloggerCourse.com  Fact Sheet for Healthcare Providers: SeriousBroker.it  This test is not yet approved or cleared by the [place]  FDA and has been authorized for detection and/or diagnosis of  SARS-CoV-2 by FDA under an Emergency Use Authorization (EUA). This EUA will remain in effect (meaning this test can be used) for the duration of the COVID-19 declaration under Section 564(b)(1) of the Act, 21 U.S.C. section 360bbb-3(b)(1), unless the authorization is terminated or revoked.  Performed at [facility], [address] [place], [place] [address]   Gastrointestinal Panel by PCR , Stool     Status: Abnormal   Collection Time: 09/19/23  8:00 PM   Specimen: STOOL  Result Value Ref Range Status   Campylobacter species NOT DETECTED NOT DETECTED Final   Plesimonas shigelloides NOT DETECTED NOT DETECTED Final   Salmonella species NOT DETECTED NOT DETECTED Final   Yersinia enterocolitica NOT DETECTED NOT DETECTED Final   Vibrio species NOT DETECTED NOT DETECTED Final   Vibrio cholerae NOT DETECTED NOT DETECTED Final   Enteroaggregative E coli (EAEC) NOT DETECTED NOT DETECTED Final   Enteropathogenic E coli (EPEC) NOT DETECTED NOT DETECTED Final   Enterotoxigenic E coli (ETEC) NOT DETECTED NOT DETECTED Final   Shiga like toxin producing E coli (STEC) NOT DETECTED NOT DETECTED Final   Shigella/Enteroinvasive E coli (EIEC) NOT DETECTED NOT DETECTED Final   Cryptosporidium NOT DETECTED NOT DETECTED Final   Cyclospora cayetanensis NOT DETECTED NOT DETECTED Final   Entamoeba histolytica NOT DETECTED NOT DETECTED Final   Giardia lamblia NOT DETECTED NOT DETECTED Final   Adenovirus F40/41 NOT DETECTED NOT DETECTED Final   Astrovirus NOT DETECTED NOT DETECTED Final   Norovirus GI/GII DETECTED (A) NOT DETECTED Final    Comment: RESULT CALLED TO, READ BACK BY AND VERIFIED WITH: JOYYA BAYNUM, LPN @0437  09/20/2023 COP    Rotavirus A NOT DETECTED NOT DETECTED Final   Sapovirus (I, II, IV, and V) NOT DETECTED NOT DETECTED Final    Comment: Performed at [facility], [address] [place], [place] [address]  C Difficile Quick Screen w PCR reflex     Status: None    Collection Time: 09/19/23  8:00 PM   Specimen: STOOL  Result Value Ref Range Status   C Diff antigen NEGATIVE NEGATIVE Final   C Diff toxin NEGATIVE NEGATIVE Final   C Diff interpretation No C. difficile detected.  Final    Comment: Performed at [facility], [address] [place], [place] [address]   *Note: Due to a large number of results and/or encounters for the requested time period, some results have not been displayed. A complete set of results can be found in  Results Review.    Coagulation Studies: No results for input(s): LABPROT, INR in the last 72 hours.  Urinalysis: No results for input(s): COLORURINE, LABSPEC, PHURINE, GLUCOSEU, HGBUR, BILIRUBINUR, KETONESUR, PROTEINUR, UROBILINOGEN, NITRITE, LEUKOCYTESUR in the last 72 hours.  Invalid input(s): APPERANCEUR     Imaging: No results found.     Medications:      amiodarone   200 mg Oral Daily   atorvastatin   10 mg Oral Daily   edoxaban   30 mg Oral Daily   enzalutamide   160 mg Oral Daily   feeding supplement (GLUCERNA SHAKE)  237 mL Oral TID BM   pantoprazole   40 mg Oral Daily   polyethylene glycol  17 g Oral Daily   umeclidinium bromide   1 puff Inhalation Daily   verapamil   120 mg Oral QHS   acetaminophen  **OR** acetaminophen , prochlorperazine   Assessment/ Plan:  Mr. NAVEED HUMPHRES is a 89 y.o.  male with past medical history of atrial fibrillation, hypertension, aortic atherosclerosis, COPD, obstructive sleep apnea, CKD, osteoarthritis, diabetes, prostate cancer treated with Xtandi .  Patient presents to the emergency department for evaluation of left abdominal pain.  Patient has been admitted for Acute renal insufficiency [N28.9] Acute encephalopathy [G93.40] Skin breakdown [R23.8] Non compliance w medication regimen [Z91.148] Acute metabolic encephalopathy [G93.41]   Acute Kidney Injury on chronic kidney disease stage IIIa with baseline creatinine 1.40 and GFR of  48 on 07/06/23.  Acute kidney injury secondary poor oral intake from confusion. Chronic kidney disease is secondary to hypertension and diabetes CT abd pelvis shows bilateral kidney stones without obstruction. No recent IV contrast exposure. Has received IV fluids during this admission.   Creatinine continues to improve slowly. Patient is cleared to discharge from renal stance. Will schedule follow up appt in our office in 1-2 weeks.   Lab Results  Component Value Date   CREATININE 3.00 (H) 09/30/2023   CREATININE 3.07 (H) 09/29/2023   CREATININE 3.18 (H) 09/28/2023    Intake/Output Summary (Last 24 hours) at 09/30/2023 1119 Last data filed at 09/30/2023 1100 Gross per 24 hour  Intake 720 ml  Output --  Net 720 ml   2. Anemia of chronic kidney disease Lab Results  Component Value Date   HGB 14.9 09/28/2023    Hgb remains within optimal range.  3. Hypertension with chronic kidney disease. Home regimen includes metoprolol  and verapamil . These have been resumed during this admission.   Blood pressure 106/67. Metoprolol  held. Continue current regimen.   4. Secondary Hyperparathyroidism: with outpatient labs: PTH 1.2, phosphorus 3.5, calcium  9.9 on 02/26/23.   Lab Results  Component Value Date   CALCIUM  9.1 09/30/2023   CAION 1.22 06/28/2023   PHOS 3.0 09/19/2023    Calcium  acceptable.    LOS: [address] 9/9/202511:19 AM

## 2023-09-30 NOTE — Plan of Care (Signed)
  Problem: Nutrition: Goal: Adequate nutrition will be maintained Outcome: Progressing   Problem: Coping: Goal: Level of anxiety will decrease Outcome: Progressing   Problem: Elimination: Goal: Will not experience complications related to bowel motility Outcome: Progressing   Problem: Pain Managment: Goal: General experience of comfort will improve and/or be controlled Outcome: Progressing

## 2023-09-30 NOTE — Progress Notes (Signed)
 Mobility Specialist - Progress Note    09/30/23 1531  Mobility  Activity Ambulated with assistance  Level of Assistance Contact guard assist, steadying assist  Assistive Device Front wheel walker  Range of Motion/Exercises Active  Activity Response Tolerated well  Mobility Referral Yes  Mobility visit 1 Mobility   Pt resting in bed on RA upon entry. Pt STS x2 in room and performed assisted hygiene (nose bleeding observed and blood seen in bm) RN aware. Pt speaking more today. Pt returned to bed and left with needs in reach. Pt bed alarm activated.   Guido Rumble Mobility Specialist 09/30/23, 3:38 PM

## 2023-09-30 NOTE — Progress Notes (Signed)
 Patient had nose bleed and notice red blood in bowel movement, MD was made aware. Will continue to monitor.

## 2023-09-30 NOTE — TOC Progression Note (Addendum)
 Transition of Care [facility]) - Progression Note    Patient Details  Name: [name] MRN: [id number] Date of Birth: July 28, 1933  [facility] CM/SW Contact  Brenen Beigel E Sabiha Sura, LCSW Phone Number: 09/30/2023, 11:20 AM  Clinical Narrative:    Call received from [facility] Rep [place]. Answered her questions. She states they will process the auth request. Updated assigned CSW and CMA.                     Expected Discharge Plan and Services                                               Social Drivers of Health (SDOH) Interventions SDOH Screenings   Food Insecurity: No Food Insecurity (09/18/2023)  Housing: Low Risk  (09/18/2023)  Transportation Needs: No Transportation Needs (09/18/2023)  Utilities: Patient Unable To Answer (09/18/2023)  Alcohol Screen: Low Risk  (10/26/2021)  Depression (PHQ2-9): Low Risk  (08/13/2023)  Financial Resource Strain: Low Risk  (07/28/2023)  Physical Activity: Inactive (10/29/2019)  Social Connections: Patient Unable To Answer (09/18/2023)  Stress: No Stress Concern Present (10/29/2019)  Recent Concern: Stress - Stress Concern Present (09/08/2019)  Tobacco Use: Medium Risk (09/18/2023)  Health Literacy: Inadequate Health Literacy (07/28/2023)    Readmission Risk Interventions    07/01/2023    4:26 PM 09/11/2022    1:23 PM  Readmission Risk Prevention Plan  Transportation Screening Complete Complete  Medication Review (RN Care Manager) Complete Referral to Pharmacy  PCP or Specialist appointment within 3-5 days of discharge Complete Complete  HRI or Home Care Consult Complete Complete  SW Recovery Care/Counseling Consult Complete Complete  Palliative Care Screening Not Applicable Not Applicable  Skilled Nursing Facility Not Applicable Not Applicable

## 2023-09-30 NOTE — Plan of Care (Signed)
   Problem: Education: Goal: Knowledge of General Education information will improve Description Including pain rating scale, medication(s)/side effects and non-pharmacologic comfort measures Outcome: Progressing

## 2023-10-01 DIAGNOSIS — I63412 Cerebral infarction due to embolism of left middle cerebral artery: Secondary | ICD-10-CM | POA: Diagnosis not present

## 2023-10-01 DIAGNOSIS — E8809 Other disorders of plasma-protein metabolism, not elsewhere classified: Secondary | ICD-10-CM | POA: Diagnosis not present

## 2023-10-01 DIAGNOSIS — R1312 Dysphagia, oropharyngeal phase: Secondary | ICD-10-CM | POA: Diagnosis not present

## 2023-10-01 DIAGNOSIS — G4733 Obstructive sleep apnea (adult) (pediatric): Secondary | ICD-10-CM | POA: Diagnosis not present

## 2023-10-01 DIAGNOSIS — J439 Emphysema, unspecified: Secondary | ICD-10-CM

## 2023-10-01 DIAGNOSIS — N179 Acute kidney failure, unspecified: Secondary | ICD-10-CM | POA: Diagnosis not present

## 2023-10-01 DIAGNOSIS — E1122 Type 2 diabetes mellitus with diabetic chronic kidney disease: Secondary | ICD-10-CM | POA: Diagnosis not present

## 2023-10-01 DIAGNOSIS — E1165 Type 2 diabetes mellitus with hyperglycemia: Secondary | ICD-10-CM | POA: Diagnosis not present

## 2023-10-01 DIAGNOSIS — I480 Paroxysmal atrial fibrillation: Secondary | ICD-10-CM | POA: Diagnosis not present

## 2023-10-01 DIAGNOSIS — G9340 Encephalopathy, unspecified: Secondary | ICD-10-CM | POA: Diagnosis not present

## 2023-10-01 DIAGNOSIS — I69351 Hemiplegia and hemiparesis following cerebral infarction affecting right dominant side: Secondary | ICD-10-CM | POA: Diagnosis not present

## 2023-10-01 DIAGNOSIS — I4821 Permanent atrial fibrillation: Secondary | ICD-10-CM | POA: Diagnosis not present

## 2023-10-01 DIAGNOSIS — E46 Unspecified protein-calorie malnutrition: Secondary | ICD-10-CM | POA: Diagnosis not present

## 2023-10-01 DIAGNOSIS — I2510 Atherosclerotic heart disease of native coronary artery without angina pectoris: Secondary | ICD-10-CM | POA: Diagnosis not present

## 2023-10-01 DIAGNOSIS — K921 Melena: Secondary | ICD-10-CM | POA: Diagnosis not present

## 2023-10-01 DIAGNOSIS — Z8673 Personal history of transient ischemic attack (TIA), and cerebral infarction without residual deficits: Secondary | ICD-10-CM | POA: Diagnosis not present

## 2023-10-01 DIAGNOSIS — J449 Chronic obstructive pulmonary disease, unspecified: Secondary | ICD-10-CM | POA: Diagnosis not present

## 2023-10-01 DIAGNOSIS — I69320 Aphasia following cerebral infarction: Secondary | ICD-10-CM | POA: Diagnosis not present

## 2023-10-01 DIAGNOSIS — A0811 Acute gastroenteropathy due to Norwalk agent: Secondary | ICD-10-CM | POA: Diagnosis not present

## 2023-10-01 DIAGNOSIS — I69391 Dysphagia following cerebral infarction: Secondary | ICD-10-CM | POA: Diagnosis not present

## 2023-10-01 DIAGNOSIS — I509 Heart failure, unspecified: Secondary | ICD-10-CM | POA: Diagnosis not present

## 2023-10-01 DIAGNOSIS — Z86718 Personal history of other venous thrombosis and embolism: Secondary | ICD-10-CM | POA: Diagnosis not present

## 2023-10-01 DIAGNOSIS — C61 Malignant neoplasm of prostate: Secondary | ICD-10-CM | POA: Diagnosis not present

## 2023-10-01 DIAGNOSIS — N184 Chronic kidney disease, stage 4 (severe): Secondary | ICD-10-CM | POA: Diagnosis not present

## 2023-10-01 DIAGNOSIS — I4891 Unspecified atrial fibrillation: Secondary | ICD-10-CM | POA: Diagnosis not present

## 2023-10-01 DIAGNOSIS — N1832 Chronic kidney disease, stage 3b: Secondary | ICD-10-CM | POA: Diagnosis not present

## 2023-10-01 DIAGNOSIS — I739 Peripheral vascular disease, unspecified: Secondary | ICD-10-CM | POA: Diagnosis not present

## 2023-10-01 DIAGNOSIS — I130 Hypertensive heart and chronic kidney disease with heart failure and stage 1 through stage 4 chronic kidney disease, or unspecified chronic kidney disease: Secondary | ICD-10-CM | POA: Diagnosis not present

## 2023-10-01 DIAGNOSIS — E785 Hyperlipidemia, unspecified: Secondary | ICD-10-CM | POA: Diagnosis not present

## 2023-10-01 DIAGNOSIS — N183 Chronic kidney disease, stage 3 unspecified: Secondary | ICD-10-CM | POA: Diagnosis not present

## 2023-10-01 DIAGNOSIS — Z743 Need for continuous supervision: Secondary | ICD-10-CM | POA: Diagnosis not present

## 2023-10-01 DIAGNOSIS — I5042 Chronic combined systolic (congestive) and diastolic (congestive) heart failure: Secondary | ICD-10-CM | POA: Diagnosis not present

## 2023-10-01 DIAGNOSIS — K219 Gastro-esophageal reflux disease without esophagitis: Secondary | ICD-10-CM | POA: Diagnosis not present

## 2023-10-01 DIAGNOSIS — E1151 Type 2 diabetes mellitus with diabetic peripheral angiopathy without gangrene: Secondary | ICD-10-CM | POA: Diagnosis not present

## 2023-10-01 DIAGNOSIS — R5381 Other malaise: Secondary | ICD-10-CM | POA: Diagnosis not present

## 2023-10-01 DIAGNOSIS — Z7401 Bed confinement status: Secondary | ICD-10-CM | POA: Diagnosis not present

## 2023-10-01 DIAGNOSIS — K626 Ulcer of anus and rectum: Secondary | ICD-10-CM | POA: Diagnosis not present

## 2023-10-01 DIAGNOSIS — I5022 Chronic systolic (congestive) heart failure: Secondary | ICD-10-CM | POA: Diagnosis not present

## 2023-10-01 DIAGNOSIS — Z87891 Personal history of nicotine dependence: Secondary | ICD-10-CM | POA: Diagnosis not present

## 2023-10-01 DIAGNOSIS — G9341 Metabolic encephalopathy: Secondary | ICD-10-CM | POA: Diagnosis not present

## 2023-10-01 LAB — CBC
HCT: 44.9 % (ref 39.0–52.0)
Hemoglobin: 14.5 g/dL (ref 13.0–17.0)
MCH: 30.1 pg (ref 26.0–34.0)
MCHC: 32.3 g/dL (ref 30.0–36.0)
MCV: 93.3 fL (ref 80.0–100.0)
Platelets: 260 K/uL (ref 150–400)
RBC: 4.81 MIL/uL (ref 4.22–5.81)
RDW: 15.4 % (ref 11.5–15.5)
WBC: 5.8 K/uL (ref 4.0–10.5)
nRBC: 0 % (ref 0.0–0.2)

## 2023-10-01 LAB — BASIC METABOLIC PANEL WITH GFR
Anion gap: 8 (ref 5–15)
BUN: 53 mg/dL — ABNORMAL HIGH (ref 8–23)
CO2: 23 mmol/L (ref 22–32)
Calcium: 9.2 mg/dL (ref 8.9–10.3)
Chloride: 106 mmol/L (ref 98–111)
Creatinine, Ser: 3.01 mg/dL — ABNORMAL HIGH (ref 0.61–1.24)
GFR, Estimated: 19 mL/min — ABNORMAL LOW (ref >60)
Glucose, Bld: 140 mg/dL — ABNORMAL HIGH (ref 70–99)
Potassium: 5 mmol/L (ref 3.5–5.1)
Sodium: 137 mmol/L (ref 135–145)

## 2023-10-01 MED ORDER — GLUCERNA SHAKE PO LIQD: 237.0000 mL | Freq: Three times a day (TID) | ORAL | Status: AC

## 2023-10-01 MED ORDER — LOPERAMIDE HCL 2 MG PO CAPS: 2.0000 mg | ORAL_CAPSULE | Freq: Three times a day (TID) | ORAL | Status: DC | PRN

## 2023-10-01 MED ORDER — ACETAMINOPHEN 325 MG PO TABS: 650.0000 mg | ORAL_TABLET | Freq: Four times a day (QID) | ORAL | Status: AC | PRN

## 2023-10-01 MED ORDER — LOPERAMIDE HCL 2 MG PO CAPS: 2.0000 mg | ORAL_CAPSULE | Freq: Three times a day (TID) | ORAL | 0 refills | Status: AC | PRN

## 2023-10-01 NOTE — Progress Notes (Signed)
 [facility]  ROUNDING NOTE   Subjective:   [name] is a 89 y.o. male with past medical history of atrial fibrillation, hypertension, aortic atherosclerosis, COPD, obstructive sleep apnea, CKD, osteoarthritis, diabetes, prostate cancer treated with Xtandi .  Patient presents to the emergency department for evaluation of left abdominal pain.  Patient has been admitted for Acute renal insufficiency [N28.9] Acute encephalopathy [G93.40] Skin breakdown [R23.8] Non compliance w medication regimen [Z91.148] Acute metabolic encephalopathy [G93.41]  Patient is known to our practice followed outpatient by Dr. Dennise.    Update:  Patient seen resting in bed Alert and oriented to self  Creatinine 3.01   Objective:  Vital signs in last 24 hours:  Temp:  [97 F (36.1 C)-98 F (36.7 C)] 97.5 F (36.4 C) (09/10 0759) Pulse Rate:  [70-80] 71 (09/10 0759) Resp:  [16-18] 16 (09/10 0759) BP: (121-128)/(72-89) 128/89 (09/10 0759) SpO2:  [98 %-100 %] 100 % (09/10 0759)  Weight change:  Filed Weights   09/18/23 1546  Weight: 94 kg    Intake/Output: I/O last 3 completed shifts: In: 900 [P.O.:900] Out: 1250 [Urine:1250]   Intake/Output this shift:  Total I/O In: 120 [P.O.:120] Out: -   Physical Exam: General: NAD, resting quietly   Head: Normocephalic, atraumatic. Moist oral mucosal membranes  Eyes: Anicteric  Lungs:  Clear to auscultation. Normal effort  Heart: Regular rate and rhythm  Abdomen:  Soft, nontender  Extremities:  No peripheral edema.  Neurologic: Awake and alert  Skin: Warm,dry, no rash       Basic Metabolic Panel: Recent Labs  Lab 09/27/23 0523 09/27/23 2303 09/28/23 0534 09/29/23 0332 09/30/23 0703 10/01/23 0441  NA 136  --  136 135 139 137  K 4.7  --  4.7 5.1 4.7 5.0  CL 105  --  104 104 103 106  CO2 22  --  21* 22 23 23   GLUCOSE 129*  --  109* 126* 139* 140*  BUN 48*  --  53* 53* 53* 53*  CREATININE 2.92*  --  3.18* 3.07*  3.00* 3.01*  CALCIUM  8.7*  --  8.9 8.8* 9.1 9.2  MG  --  2.3  --   --   --   --     Liver Function Tests: No results for input(s): AST, ALT, ALKPHOS, BILITOT, PROT, ALBUMIN  in the last 168 hours.  No results for input(s): LIPASE, AMYLASE in the last 168 hours. No results for input(s): AMMONIA in the last 168 hours.  CBC: Recent Labs  Lab 09/25/23 0517 09/28/23 0534 10/01/23 0441  WBC 6.1 5.6 5.8  HGB 14.8 14.9 14.5  HCT 46.0 44.6 44.9  MCV 94.1 93.1 93.3  PLT 222 236 260    Cardiac Enzymes: No results for input(s): CKTOTAL, CKMB, CKMBINDEX, TROPONINI in the last 168 hours.  BNP: Invalid input(s): POCBNP  CBG: Recent Labs  Lab 09/28/23 1135 09/28/23 1655 09/28/23 2159 09/29/23 0742 09/29/23 1157  GLUCAP 151* 84 129* 146* 186*    Microbiology: Results for orders placed or performed during the hospital encounter of 09/18/23  Urine Culture (for pregnant, neutropenic or urologic patients or patients with an indwelling urinary catheter)     Status: None   Collection Time: 09/18/23  3:54 PM   Specimen: Urine, Clean Catch  Result Value Ref Range Status   Specimen Description   Final    URINE, CLEAN CATCH Performed at [facility], [address] [place], [place] [address]    Special Requests  Final    NONE Performed at [facility], [address], [place], [place] [address]    Culture   Final    NO GROWTH Performed at [facility], [address], [place], [place] [address]    Report Status 09/20/2023 FINAL  Final  Culture, blood (Routine X 2) w Reflex to ID Panel     Status: None   Collection Time: 09/19/23  2:48 PM   Specimen: BLOOD  Result Value Ref Range Status   Specimen Description   Final    BLOOD BLOOD LEFT ARM Performed at [facility], [address], [place], [place] [address]    Special Requests   Final    BOTTLES DRAWN AEROBIC AND ANAEROBIC Blood Culture adequate  volume Performed at [facility], [address] [place], [place] [address]    Culture   Final    NO GROWTH 5 DAYS Performed at [facility], [address] [place], [place] [address]    Report Status 09/24/2023 FINAL  Final  Culture, blood (Routine X 2) w Reflex to ID Panel     Status: None   Collection Time: 09/19/23  2:48 PM   Specimen: BLOOD  Result Value Ref Range Status   Specimen Description   Final    BLOOD BLOOD LEFT HAND Performed at [facility], [address] [place], [place] [address]    Special Requests   Final    BOTTLES DRAWN AEROBIC AND ANAEROBIC Blood Culture adequate volume Performed at [facility], [address], [place], [place] [address]    Culture   Final    NO GROWTH 5 DAYS Performed at [facility], [address] [place], [place] [address]    Report Status 09/24/2023 FINAL  Final  Resp panel by RT-PCR (RSV, Flu A&B, Covid) Anterior Nasal Swab     Status: None   Collection Time: 09/19/23  4:27 PM   Specimen: Anterior Nasal Swab  Result Value Ref Range Status   SARS Coronavirus 2 by RT PCR NEGATIVE NEGATIVE Final    Comment: (NOTE) SARS-CoV-2 target nucleic acids are NOT DETECTED.  The SARS-CoV-2 RNA is generally detectable in upper respiratory specimens during the acute phase of infection. The lowest concentration of SARS-CoV-2 viral copies this assay can detect is 138 copies/mL. A negative result does not preclude SARS-Cov-2 infection and should not be used as the sole basis for treatment or other patient management decisions. A negative result may occur with  improper specimen collection/handling, submission of specimen other than nasopharyngeal swab, presence of viral mutation(s) within the areas targeted by this assay, and inadequate number of viral copies(<138 copies/mL). A negative result must be combined with clinical observations, patient history, and epidemiological information. The expected  result is Negative.  Fact Sheet for Patients:  BloggerCourse.com  Fact Sheet for Healthcare Providers:  SeriousBroker.it  This test is no t yet approved or cleared by the [place]  FDA and  has been authorized for detection and/or diagnosis of SARS-CoV-2 by FDA under an Emergency Use Authorization (EUA). This EUA will remain  in effect (meaning this test can be used) for the duration of the COVID-19 declaration under Section 564(b)(1) of the Act, 21 U.S.C.section 360bbb-3(b)(1), unless the authorization is terminated  or revoked sooner.       Influenza A by PCR NEGATIVE NEGATIVE Final   Influenza B by PCR NEGATIVE NEGATIVE Final    Comment: (NOTE) The Xpert Xpress SARS-CoV-2/FLU/RSV plus  assay is intended as an aid in the diagnosis of influenza from Nasopharyngeal swab specimens and should not be used as a sole basis for treatment. Nasal washings and aspirates are unacceptable for Xpert Xpress SARS-CoV-2/FLU/RSV testing.  Fact Sheet for Patients: BloggerCourse.com  Fact Sheet for Healthcare Providers: SeriousBroker.it  This test is not yet approved or cleared by the [place]  FDA and has been authorized for detection and/or diagnosis of SARS-CoV-2 by FDA under an Emergency Use Authorization (EUA). This EUA will remain in effect (meaning this test can be used) for the duration of the COVID-19 declaration under Section 564(b)(1) of the Act, 21 U.S.C. section 360bbb-3(b)(1), unless the authorization is terminated or revoked.     Resp Syncytial Virus by PCR NEGATIVE NEGATIVE Final    Comment: (NOTE) Fact Sheet for Patients: BloggerCourse.com  Fact Sheet for Healthcare Providers: SeriousBroker.it  This test is not yet approved or cleared by the [place]  FDA and has been authorized for detection and/or diagnosis of  SARS-CoV-2 by FDA under an Emergency Use Authorization (EUA). This EUA will remain in effect (meaning this test can be used) for the duration of the COVID-19 declaration under Section 564(b)(1) of the Act, 21 U.S.C. section 360bbb-3(b)(1), unless the authorization is terminated or revoked.  Performed at [facility], [address] [place], [place] [address]   Gastrointestinal Panel by PCR , Stool     Status: Abnormal   Collection Time: 09/19/23  8:00 PM   Specimen: STOOL  Result Value Ref Range Status   Campylobacter species NOT DETECTED NOT DETECTED Final   Plesimonas shigelloides NOT DETECTED NOT DETECTED Final   Salmonella species NOT DETECTED NOT DETECTED Final   Yersinia enterocolitica NOT DETECTED NOT DETECTED Final   Vibrio species NOT DETECTED NOT DETECTED Final   Vibrio cholerae NOT DETECTED NOT DETECTED Final   Enteroaggregative E coli (EAEC) NOT DETECTED NOT DETECTED Final   Enteropathogenic E coli (EPEC) NOT DETECTED NOT DETECTED Final   Enterotoxigenic E coli (ETEC) NOT DETECTED NOT DETECTED Final   Shiga like toxin producing E coli (STEC) NOT DETECTED NOT DETECTED Final   Shigella/Enteroinvasive E coli (EIEC) NOT DETECTED NOT DETECTED Final   Cryptosporidium NOT DETECTED NOT DETECTED Final   Cyclospora cayetanensis NOT DETECTED NOT DETECTED Final   Entamoeba histolytica NOT DETECTED NOT DETECTED Final   Giardia lamblia NOT DETECTED NOT DETECTED Final   Adenovirus F40/41 NOT DETECTED NOT DETECTED Final   Astrovirus NOT DETECTED NOT DETECTED Final   Norovirus GI/GII DETECTED (A) NOT DETECTED Final    Comment: RESULT CALLED TO, READ BACK BY AND VERIFIED WITH: JOYYA BAYNUM, LPN @0437  09/20/2023 COP    Rotavirus A NOT DETECTED NOT DETECTED Final   Sapovirus (I, II, IV, and V) NOT DETECTED NOT DETECTED Final    Comment: Performed at [facility], [address] [place], [place] [address]  C Difficile Quick Screen w PCR reflex     Status: None    Collection Time: 09/19/23  8:00 PM   Specimen: STOOL  Result Value Ref Range Status   C Diff antigen NEGATIVE NEGATIVE Final   C Diff toxin NEGATIVE NEGATIVE Final   C Diff interpretation No C. difficile detected.  Final    Comment: Performed at [facility], [address] [place], [place] [address]   *Note: Due to a large number of results and/or encounters for the requested time period, some results have not been displayed. A complete set of results can be found in  Results Review.    Coagulation Studies: No results for input(s): LABPROT, INR in the last 72 hours.  Urinalysis: No results for input(s): COLORURINE, LABSPEC, PHURINE, GLUCOSEU, HGBUR, BILIRUBINUR, KETONESUR, PROTEINUR, UROBILINOGEN, NITRITE, LEUKOCYTESUR in the last 72 hours.  Invalid input(s): APPERANCEUR     Imaging: No results found.     Medications:      amiodarone   200 mg Oral Daily   atorvastatin   10 mg Oral Daily   edoxaban   30 mg Oral Daily   enzalutamide   160 mg Oral Daily   feeding supplement (GLUCERNA SHAKE)  237 mL Oral TID BM   pantoprazole   40 mg Oral Daily   polyethylene glycol  17 g Oral Daily   umeclidinium bromide   1 puff Inhalation Daily   verapamil   120 mg Oral QHS   acetaminophen  **OR** acetaminophen , loperamide , prochlorperazine   Assessment/ Plan:  [name] is a 89 y.o.  male with past medical history of atrial fibrillation, hypertension, aortic atherosclerosis, COPD, obstructive sleep apnea, CKD, osteoarthritis, diabetes, prostate cancer treated with Xtandi .  Patient presents to the emergency department for evaluation of left abdominal pain.  Patient has been admitted for Acute renal insufficiency [N28.9] Acute encephalopathy [G93.40] Skin breakdown [R23.8] Non compliance w medication regimen [Z91.148] Acute metabolic encephalopathy [G93.41]   Acute Kidney Injury on chronic kidney disease stage IIIa with baseline creatinine  1.40 and GFR of 48 on 07/06/23.  Acute kidney injury secondary poor oral intake from confusion. Chronic kidney disease is secondary to hypertension and diabetes CT abd pelvis shows bilateral kidney stones without obstruction. No recent IV contrast exposure. Has received IV fluids during this admission.   Creatinine remains stable. Will follow up with Dr Jimmie at discharge.   Lab Results  Component Value Date   CREATININE 3.01 (H) 10/01/2023   CREATININE 3.00 (H) 09/30/2023   CREATININE 3.07 (H) 09/29/2023    Intake/Output Summary (Last 24 hours) at 10/01/2023 1053 Last data filed at 10/01/2023 0900 Gross per 24 hour  Intake 1020 ml  Output 1250 ml  Net -230 ml   2. Anemia of chronic kidney disease Lab Results  Component Value Date   HGB 14.5 10/01/2023    Hgb remains within optimal range.  3. Hypertension with chronic kidney disease. Home regimen includes metoprolol  and verapamil . These have been resumed during this admission.   Blood pressure 128/89, stable  4. Secondary Hyperparathyroidism: with outpatient labs: PTH 1.2, phosphorus 3.5, calcium  9.9 on 02/26/23.   Lab Results  Component Value Date   CALCIUM  9.2 10/01/2023   CAION 1.22 06/28/2023   PHOS 3.0 09/19/2023    Will continue to monitor bone minerals.    LOS: 13 Zoeya Gramajo 9/10/202510:53 AM

## 2023-10-01 NOTE — Plan of Care (Signed)
   Problem: Health Behavior/Discharge Planning: Goal: Ability to manage health-related needs will improve Outcome: Progressing   Problem: Nutrition: Goal: Adequate nutrition will be maintained Outcome: Progressing

## 2023-10-01 NOTE — TOC Transition Note (Signed)
 Transition of Care [facility]) - Discharge Note   Patient Details  Name: [name] MRN: [id number] Date of Birth: 03-09-1933  Transition of Care [facility]) CM/SW Contact:  Alvaro Louder, LCSW Phone Number: 10/01/2023, 9:36 AM   Clinical Narrative:   LCSWA received insurance approval for patient to admit to SNF [facility]. LCSWA confirmed with MD that patient is stable for discharge. MD notified the patient's daughter and they are in agreement with discharge. LCSWA confirmed bed is available at SNF . Transport arranged with [facility] for next available.  Number to call report [phone] RM: [phone]  TOC Signing off   Final next level of care: Skilled Nursing Facility Barriers to Discharge: No Barriers Identified   Patient Goals and CMS Choice            Discharge Placement              Patient chooses bed at: [facility] Patient to be transferred to facility by: [facility] Name of family member notified: Tiffany Patient and family notified of of transfer: 10/01/23  Discharge Plan and Services Additional resources added to the After Visit Summary for                                       Social Drivers of Health (SDOH) Interventions SDOH Screenings   Food Insecurity: No Food Insecurity (09/18/2023)  Housing: Low Risk  (09/18/2023)  Transportation Needs: No Transportation Needs (09/18/2023)  Utilities: Patient Unable To Answer (09/18/2023)  Alcohol Screen: Low Risk  (10/26/2021)  Depression (PHQ2-9): Low Risk  (08/13/2023)  Financial Resource Strain: Low Risk  (07/28/2023)  Physical Activity: Inactive (10/29/2019)  Social Connections: Patient Unable To Answer (09/18/2023)  Stress: No Stress Concern Present (10/29/2019)  Recent Concern: Stress - Stress Concern Present (09/08/2019)  Tobacco Use: Medium Risk (09/18/2023)  Health Literacy: Inadequate Health Literacy (07/28/2023)     Readmission Risk Interventions    07/01/2023    4:26 PM 09/11/2022     1:23 PM  Readmission Risk Prevention Plan  Transportation Screening Complete Complete  Medication Review (RN Care Manager) Complete Referral to Pharmacy  PCP or Specialist appointment within 3-5 days of discharge Complete Complete  HRI or Home Care Consult Complete Complete  SW Recovery Care/Counseling Consult Complete Complete  Palliative Care Screening Not Applicable Not Applicable  Skilled Nursing Facility Not Applicable Not Applicable

## 2023-10-01 NOTE — Discharge Summary (Signed)
 Physician Discharge Summary   Patient: [name] MRN: [id number] DOB: 07-Dec-1933  Admit date:     09/18/2023  Discharge date: 10/01/23  Discharge Physician: Charlie Patterson   PCP: Liana Fish, NP   Recommendations at discharge:    Follow up team at rehab one day Follow up Dr Dennise Nephrology 2 weeks Check bmp in one week  Discharge Diagnoses: Principal Problem:   Acute metabolic encephalopathy Active Problems:   Acute kidney injury superimposed on stage 4 chronic kidney disease (HCC)   Monomorphic ventricular tachycardia (HCC)   Essential hypertension   OSA (obstructive sleep apnea)   Elevated troponin   Type 2 diabetes mellitus with hyperglycemia (HCC)   COPD (chronic obstructive pulmonary disease) (HCC)   Prostate cancer (HCC)   (HFpEF) heart failure with preserved ejection fraction (HCC)   S/P placement of leadless cardiac pacemaker   Prolonged QT interval   Elevated brain natriuretic peptide (BNP) level   Hypoalbuminemia due to protein-calorie malnutrition (HCC)   Acute on chronic combined systolic and diastolic CHF (congestive heart failure) (HCC)   Permanent atrial fibrillation with RVR (HCC)   History of ischemic stroke   History of DVT (deep vein thrombosis)   History of GI bleed   Norovirus   Hematochezia   Debility   Hospital Course: Hospital course / significant events:   HPI: [name] is a 89 y.o. male with medical history significant of hypertension, hyperlipidemia, T2DM, permanent atrial fibrillation on edoxaban , SSS s/p PPM, HFrEF with EF of 45 to 50% -06/24/2023), V-Tach on amiodarone , stage IVb metastatic prostate cancer on Xtandi  and leuprolide  who presents to the emergency department from home via EMS due to 2-week onset of progressive worsening confusion.  Patient was unable to provide history, history was obtained from EDP, ED medical record and daughter-in-law at bedside.  Per report, patient lives at home with wife who has  dementia and he was able to take care of his ADLs and care for his wife.  However, since past 2-3 weeks, family checked on him from time to time noted changes in cognition and several episodes of diarrhea.  It was also noted that he has not been taking his medications including amiodarone  as prescribed (he was taking more pills than he was supposed to), assumptions made based on number of pills in his pillbox.  His occupational therapist followed up with him and noted he was covered in stool and was more confused, so he requested for him to be taken to the ED for further evaluation and management.   Of note: was admitted from 6/2 to 6/6 due to monomorphic V. tach during which he underwent t LHC with Dr. Ammon (06/03) and revealed 40% stenosed Ost/prox LAD, mild LV systolic dysfunction. He was also admitted from 6/7 to 6/20 due to left MCA infarct with left M1 occlusion s/p mechanical thrombectomy with TICI3 revascularization, etiology likely A-fib on DOAC in the setting of recent cardioversion.   On this admission Encephalopathy likely multifactorial w/ recent stroke affecting mentation. MRI nothing acute, no anemia, no electrolyte abnormalities, normal tsh, normal LFTs. Diarrhea/dehydration w/ AKI also might be contirbuting (+)Norovirus explains diarrhea  Hematochezia - appreciate GI input.  Patient had a flexible sigmoidoscopy which showed a rectal ulcer. Record review previously there was plan to stop anticoagulation but pt appears to have been taking this at home. Will dc this medication.  Renal function has continued to worsen, 09/26/23 trial fluids (low po intake) still worsening, not improving --> renal US ,  repeat UA, get postvoid residual, nephro consult - suspect advancing CKD? Per nephrology, ok to follow on discharge --> continues to be worse into 09/07 but better into 09/08, no change to plan per nephrology as UOP is ok, no need dialysis   09/30/23 [facility] can not take him until  tomorrow    Consultants:  Gastroenterology  nephrology  Procedures/Surgeries: 09/23/23 flexible sigmoidoscopy       ASSESSMENT & PLAN:   Acute encephalopathy - improved/resolved  Likely multifactorial, recent stroke biggest contributor.    Diarrhea d/t norovirus - improved  Continue supportive care.  Can take a prn immodium if needed   Hematochezia d/t rectal ulcer Anticoagulation/antiplatelet is contributing factor  Edoxaban  had been discontinued and now restarted, education provided re: risk/benefit and if has another GI bleed or other complication may need to hold this indefinitely    Debility Resides independently at home with wife who has dementia,  SNF rehab  Pt has been intermittently refusing PT/OT   AKI on CKD3a/b (baseline Cr 1.4-1.5 and GFR 45-48 in 06/2023 though previous hx noted CKD3b per notes)  Question progression to CKD3b/4   Last creatinine 3 Check BMP one week Nephrology follow up as outpatient   Hx v-tach History of A-fib Has pacer Continue amiodarone  for rate control   T2DM Euglycemic Hold home farxiga  for now per nephrology - can restart as outpatient if creatinine improved more On semaglutide    PAD Non-obstructive CAD Continue home statin Resume anticoag per daughter request/preference see above  HFrEF Last echo on file 06/24/2023 - LVEF of 45 to 50%, 2D echocardiogram which was done 06/24 Held home farxiga  holding lasix  for now d/t AKI/CKD, will diurese if clinically fluid up  dc metoprolol  d/t low BP   Hx DVT Continue anticoagulation   Metastatic prostate cancer Can resume home xtandi      COPD Quiescent/stable  Albuterol  and spirive    overweight based on BMI: Body mass index is 27.34 kg/m.SABRA Significantly low or high BMI is associated with higher medical risk.  Underweight - under 18  overweight - 25 to 29 obese - 30 or more Class 1 obesity: BMI of 30.0 to 34 Class 2 obesity: BMI of 35.0 to 39 Class 3 obesity: BMI  of 40.0 to 49 Super Morbid Obesity: BMI 50-59 Super-super Morbid Obesity: BMI 60+ Healthy nutrition and physical activity advised as adjunct to other disease management and risk reduction treatments   DVT prophylaxis: SCD IV fluids: no continuous IV fluids  Nutrition: cardiac/carb diet Central lines / other devices: none  Code Status: FULL CODE ACP documentation reviewed: HCPOA on file in [facility]  [facility] needs: SNF placement Medical barriers to dispo: none as of morning 09/30/23      Assessment and Plan: No notes have been filed under this hospital service. Service: Hospitalist        Consultants: Gastroneterology, Nephrology Procedures performed: flexible sigmoidoscopy Disposition: Rehabilitation facility Diet recommendation:  Soft diet heart haelthy DISCHARGE MEDICATION: Allergies as of 10/01/2023   No Known Allergies      Medication List     STOP taking these medications    dapagliflozin  propanediol 10 MG Tabs tablet Commonly known as: FARXIGA    furosemide  20 MG tablet Commonly known as: LASIX    Metoprolol  Tartrate 37.5 MG Tabs   polyethylene glycol powder 17 GM/SCOOP powder Commonly known as: GLYCOLAX /MIRALAX        TAKE these medications    acetaminophen  325 MG tablet Commonly known as: TYLENOL  Take 2 tablets (650 mg total)  by mouth every 6 (six) hours as needed for mild pain (pain score 1-3) or fever (or Fever >/= 101).   albuterol  108 (90 Base) MCG/ACT inhaler Commonly known as: VENTOLIN  HFA Inhale 2 puffs into the lungs every 4 (four) hours as needed for wheezing or shortness of breath.   amiodarone  200 MG tablet Commonly known as: PACERONE  Take 1 tablet (200 mg total) by mouth daily.   atorvastatin  10 MG tablet Commonly known as: LIPITOR Take 1 tablet (10 mg total) by mouth daily.   cyanocobalamin  1000 MCG tablet Commonly known as: VITAMIN B12 Take 1 tablet (1,000 mcg total) by mouth daily.   feeding supplement (GLUCERNA SHAKE)  Liqd Take 237 mLs by mouth 3 (three) times daily between meals.   FeroSul 325 (65 FE) MG tablet Generic drug: ferrous sulfate  Take 1 tablet by mouth daily.   loperamide  2 MG capsule Commonly known as: IMODIUM  Take 1 capsule (2 mg total) by mouth every 8 (eight) hours as needed for diarrhea or loose stools.   nitroGLYCERIN  0.4 MG SL tablet Commonly known as: NITROSTAT  Place 1 tablet (0.4 mg total) under the tongue every 5 (five) minutes as needed for chest pain.   omeprazole 20 MG capsule Commonly known as: PRILOSEC Take 20 mg by mouth daily.   Ozempic  (0.25 or 0.5 MG/DOSE) 2 MG/3ML Sopn Generic drug: Semaglutide (0.25 or 0.5MG /DOS) Inject 0.5 mg into the skin once a week.   Savaysa  30 MG Tabs tablet Generic drug: edoxaban  Take 30 mg by mouth daily.   Spiriva  Respimat 2.5 MCG/ACT Aers Generic drug: Tiotropium Bromide  Monohydrate Inhale 2 puffs into the lungs daily.   verapamil  120 MG CR tablet Commonly known as: CALAN -SR Take 1 tablet (120 mg total) by mouth at bedtime.   VITAMIN D-3 PO Take 1 capsule by mouth daily.   Xtandi  40 MG tablet Generic drug: enzalutamide  Take 4 tablets (160 mg total) by mouth daily.        Contact information for follow-up providers     Dennise Capri, MD Follow up in 2 week(s).   Specialty: Nephrology Contact information: [address] [place] [address] [phone]              Contact information for after-discharge care     Destination     [facility] .   Service: Skilled Nursing Contact information: [address] [place]  [address] [phone]                    Discharge Exam: Fredricka Weights   09/18/23 1546  Weight: 94 kg   Physical Exam HENT:     Head: Normocephalic.  Eyes:     General: Lids are normal.  Cardiovascular:     Rate and Rhythm: Normal rate. Rhythm irregularly irregular.     Heart  sounds: Normal heart sounds, S1 normal and S2 normal.  Pulmonary:     Breath sounds: No decreased breath sounds, wheezing, rhonchi or rales.  Abdominal:     Palpations: Abdomen is soft.     Tenderness: There is no abdominal tenderness.  Musculoskeletal:     Right lower leg: Swelling present.     Left lower leg: Swelling present.  Skin:    General: Skin is warm.     Findings: No rash.  Neurological:     Mental Status: He is alert.      Condition at discharge: stable  The results of significant diagnostics  from this hospitalization (including imaging, microbiology, ancillary and laboratory) are listed below for reference.   Imaging Studies: US  RENAL Result Date: 09/27/2023 CLINICAL DATA:  Acute on chronic kidney disease. EXAM: RENAL / URINARY TRACT ULTRASOUND COMPLETE COMPARISON:  September 18, 2023.  October 21, 2014. FINDINGS: Right Kidney: Renal measurements: 8.8 x 5.7 x 6.3 cm = volume: 164 mL. Increased echogenicity of renal parenchyma is noted suggesting medical renal disease. Lobulated cortical margins are noted consistent with scarring. Several echogenic foci are noted suggesting nephrolithiasis. No mass or hydronephrosis visualized. Left Kidney: Renal measurements: 9.9 x 6.9 x 3.8 cm = volume: 136 mL. Increased echogenicity of renal parenchyma is noted. Lobulated cortical margins are noted suggesting scarring. Several echogenic foci are noted suggesting nephrolithiasis. No mass or hydronephrosis visualized. Bladder: Appears normal for degree of bladder distention. Other: None. IMPRESSION: Increased echogenicity of renal parenchyma is noted bilaterally consistent with medical renal disease. No hydronephrosis or renal obstruction is noted. Bilateral nephrolithiasis is noted. Electronically Signed   By: Lynwood Landy Raddle M.D.   On: 09/27/2023 11:14   MR BRAIN WO CONTRAST Result Date: 09/19/2023 CLINICAL DATA:  89 year old male with increasing confusion. Status post left MCA infarct, left  MCA M1 occlusion in June. EXAM: MRI HEAD WITHOUT CONTRAST TECHNIQUE: Multiplanar, multiecho pulse sequences of the brain and surrounding structures were obtained without intravenous contrast. COMPARISON:  Head CT yesterday.  Brain MRI 06/30/2023, and earlier. FINDINGS: Brain: Expected evolution of left MCA territory encephalomalacia since 06/30/2023. Some T2 shine through on diffusion. Hemosiderin. Laminar necrosis. Ex vacuo ventricular enlargement on that side. No midline shift. Pronounced chronic mineralization, susceptibility in the bilateral lentiform. Associated artifact on DWI. No convincing restricted diffusion. No midline shift, mass effect, evidence of mass lesion, extra-axial collection or acute intracranial hemorrhage. Cervicomedullary junction and pituitary are within normal limits. Stable gray and white matter signal elsewhere, mild for age white matter T2 and FLAIR hyperintensity. Suspected chronic microhemorrhage in the left brainstem series 12, image 15. Vascular: Major intracranial vascular flow voids are stable. Skull and upper cervical spine: Negative. Visualized bone marrow signal is within normal limits. Sinuses/Orbits: Stable, negative. Other: Visible internal auditory structures appear normal. Mastoids remain well aerated. IMPRESSION: 1. No acute intracranial abnormality. 2. Expected evolution of Left MCA territory encephalomalacia since June. Otherwise mild for age chronic small vessel disease. Electronically Signed   By: VEAR Hurst M.D.   On: 09/19/2023 08:34   CT ABDOMEN PELVIS WO CONTRAST Result Date: 09/18/2023 CLINICAL DATA:  Worsening abdominal pain history of metastatic prostate cancer EXAM: CT ABDOMEN AND PELVIS WITHOUT CONTRAST TECHNIQUE: Multidetector CT imaging of the abdomen and pelvis was performed following the standard protocol without IV contrast. RADIATION DOSE REDUCTION: This exam was performed according to the departmental dose-optimization program which includes automated  exposure control, adjustment of the mA and/or kV according to patient size and/or use of iterative reconstruction technique. COMPARISON:  CT 07/06/2023, 07/03/2023, 02/15/2018, 08/19/2016 FINDINGS: Lower chest: Lung bases demonstrate no acute airspace disease. Cardiomegaly. Leadless pacemaker in the right ventricle. Hepatobiliary: Hepatic cysts. Isodense exophytic lesion off the right hepatic lobe measures about 4.5 cm, likely represents complex cyst and is stable compared with multiple prior exams. No calcified gallstone or biliary dilatation Pancreas: Unremarkable. No pancreatic ductal dilatation or surrounding inflammatory changes. Spleen: Normal in size without focal abnormality. Adrenals/Urinary Tract: Adrenal glands are stable in appearance. Multifocal cortical scarring in the bilateral kidneys. Bilateral kidney stones. No hydronephrosis. The bladder appears slightly thick-walled. Stomach/Bowel: The stomach is nonenlarged. No  dilated small bowel. Diverticular disease of the colon without acute inflammation Vascular/Lymphatic: Advanced aortic atherosclerosis. Infrarenal abdominal aortic aneurysm measuring up to 3.3 cm. Mild aneurysmal dilatation of the common iliac vessels measuring 22 mm right and 21 mm left. No suspicious lymph nodes. Reproductive: Prostatectomy Other: Negative for pelvic effusion or free air. Small fat containing periumbilical hernia. Musculoskeletal: No acute or suspicious osseous abnormality. Multilevel degenerative changes. IMPRESSION: 1. No CT evidence for acute intra-abdominal or pelvic abnormality. 2. Diverticular disease of the colon without acute inflammation. 3. Bilateral kidney stones without hydronephrosis. Multifocal renal cortical scarring. 4. Aortic atherosclerosis. 3.3 cm infrarenal abdominal aortic aneurysm. Recommend follow-up ultrasound every 3 years. (Ref.: J Vasc Surg. 2018; 67:2-77 and J Am Coll Radiol 2013;10(10):789-794.) 5. Cardiomegaly. Aortic Atherosclerosis  (ICD10-I70.0). Electronically Signed   By: Luke Bun M.D.   On: 09/18/2023 17:33   CT Head Wo Contrast Result Date: 09/18/2023 CLINICAL DATA:  Worsening confusion EXAM: CT HEAD WITHOUT CONTRAST TECHNIQUE: Contiguous axial images were obtained from the base of the skull through the vertex without intravenous contrast. RADIATION DOSE REDUCTION: This exam was performed according to the departmental dose-optimization program which includes automated exposure control, adjustment of the mA and/or kV according to patient size and/or use of iterative reconstruction technique. COMPARISON:  MRI 06/30/2023, CT brain 06/29/2023 FINDINGS: Brain: Interval encephalomalacia involving the left frontal and temporal lobes, with involvement of left insula and basal ganglia, consistent with chronic left MCA infarct. Mild ex vacuo dilatation of left lateral ventricle compared to prior. Atrophy and minimal chronic small vessel ischemic changes of the white matter. Vascular: No hyperdense vessels.  Carotid vascular calcification. Skull: Normal. Negative for fracture or focal lesion. Sinuses/Orbits: No acute finding. Other: None IMPRESSION: 1. No CT evidence for acute intracranial abnormality. 2. Chronic left MCA infarct. 3. Atrophy and minimal chronic small vessel ischemic changes of the white matter. Electronically Signed   By: Luke Bun M.D.   On: 09/18/2023 17:17   DG Chest 1 View Result Date: 09/18/2023 CLINICAL DATA:  Altered mental status. EXAM: CHEST  1 VIEW COMPARISON:  June 29, 2023. FINDINGS: Stable cardiomegaly. Elevated left hemidiaphragm is again noted. No acute pulmonary disease. Bony thorax is unremarkable. IMPRESSION: No acute abnormality seen. Electronically Signed   By: Lynwood Landy Raddle M.D.   On: 09/18/2023 16:39    Microbiology: Results for orders placed or performed during the hospital encounter of 09/18/23  Urine Culture (for pregnant, neutropenic or urologic patients or patients with an indwelling  urinary catheter)     Status: None   Collection Time: 09/18/23  3:54 PM   Specimen: Urine, Clean Catch  Result Value Ref Range Status   Specimen Description   Final    URINE, CLEAN CATCH Performed at [facility], [address] [place], [place] [address]    Special Requests   Final    NONE Performed at [facility], [address] [place], [place] [address]    Culture   Final    NO GROWTH Performed at [facility], [address], [place], [place] [address]    Report Status 09/20/2023 FINAL  Final  Culture, blood (Routine X 2) w Reflex to ID Panel     Status: None   Collection Time: 09/19/23  2:48 PM   Specimen: BLOOD  Result Value Ref Range Status   Specimen Description   Final    BLOOD BLOOD LEFT ARM Performed at [facility], [address], [place], [place] [address]  Special Requests   Final    BOTTLES DRAWN AEROBIC AND ANAEROBIC Blood Culture adequate volume Performed at [facility], [address], [place], [place] [address]    Culture   Final    NO GROWTH 5 DAYS Performed at [facility], [address], [place], [place] [address]    Report Status 09/24/2023 FINAL  Final  Culture, blood (Routine X 2) w Reflex to ID Panel     Status: None   Collection Time: 09/19/23  2:48 PM   Specimen: BLOOD  Result Value Ref Range Status   Specimen Description   Final    BLOOD BLOOD LEFT HAND Performed at [facility], [address] [place], [place] [address]    Special Requests   Final    BOTTLES DRAWN AEROBIC AND ANAEROBIC Blood Culture adequate volume Performed at [facility], [address], [place], [place] [address]    Culture   Final    NO GROWTH 5 DAYS Performed at [facility], [address], [place], [place] [address]    Report Status 09/24/2023 FINAL  Final  Resp panel by RT-PCR (RSV, Flu A&B, Covid) Anterior Nasal Swab     Status: None   Collection Time: 09/19/23  4:27 PM    Specimen: Anterior Nasal Swab  Result Value Ref Range Status   SARS Coronavirus 2 by RT PCR NEGATIVE NEGATIVE Final    Comment: (NOTE) SARS-CoV-2 target nucleic acids are NOT DETECTED.  The SARS-CoV-2 RNA is generally detectable in upper respiratory specimens during the acute phase of infection. The lowest concentration of SARS-CoV-2 viral copies this assay can detect is 138 copies/mL. A negative result does not preclude SARS-Cov-2 infection and should not be used as the sole basis for treatment or other patient management decisions. A negative result may occur with  improper specimen collection/handling, submission of specimen other than nasopharyngeal swab, presence of viral mutation(s) within the areas targeted by this assay, and inadequate number of viral copies(<138 copies/mL). A negative result must be combined with clinical observations, patient history, and epidemiological information. The expected result is Negative.  Fact Sheet for Patients:  BloggerCourse.com  Fact Sheet for Healthcare Providers:  SeriousBroker.it  This test is no t yet approved or cleared by the [place]  FDA and  has been authorized for detection and/or diagnosis of SARS-CoV-2 by FDA under an Emergency Use Authorization (EUA). This EUA will remain  in effect (meaning this test can be used) for the duration of the COVID-19 declaration under Section 564(b)(1) of the Act, 21 U.S.C.section 360bbb-3(b)(1), unless the authorization is terminated  or revoked sooner.       Influenza A by PCR NEGATIVE NEGATIVE Final   Influenza B by PCR NEGATIVE NEGATIVE Final    Comment: (NOTE) The Xpert Xpress SARS-CoV-2/FLU/RSV plus assay is intended as an aid in the diagnosis of influenza from Nasopharyngeal swab specimens and should not be used as a sole basis for treatment. Nasal washings and aspirates are unacceptable for Xpert Xpress  SARS-CoV-2/FLU/RSV testing.  Fact Sheet for Patients: BloggerCourse.com  Fact Sheet for Healthcare Providers: SeriousBroker.it  This test is not yet approved or cleared by the [place]  FDA and has been authorized for detection and/or diagnosis of SARS-CoV-2 by FDA under an Emergency Use Authorization (EUA). This EUA will remain in effect (meaning this test can be used) for the duration of the COVID-19 declaration under Section 564(b)(1) of the Act, 21 U.S.C. section 360bbb-3(b)(1), unless the authorization is  terminated or revoked.     Resp Syncytial Virus by PCR NEGATIVE NEGATIVE Final    Comment: (NOTE) Fact Sheet for Patients: BloggerCourse.com  Fact Sheet for Healthcare Providers: SeriousBroker.it  This test is not yet approved or cleared by the [place]  FDA and has been authorized for detection and/or diagnosis of SARS-CoV-2 by FDA under an Emergency Use Authorization (EUA). This EUA will remain in effect (meaning this test can be used) for the duration of the COVID-19 declaration under Section 564(b)(1) of the Act, 21 U.S.C. section 360bbb-3(b)(1), unless the authorization is terminated or revoked.  Performed at [facility], [address] [place], [place] [address]   Gastrointestinal Panel by PCR , Stool     Status: Abnormal   Collection Time: 09/19/23  8:00 PM   Specimen: STOOL  Result Value Ref Range Status   Campylobacter species NOT DETECTED NOT DETECTED Final   Plesimonas shigelloides NOT DETECTED NOT DETECTED Final   Salmonella species NOT DETECTED NOT DETECTED Final   Yersinia enterocolitica NOT DETECTED NOT DETECTED Final   Vibrio species NOT DETECTED NOT DETECTED Final   Vibrio cholerae NOT DETECTED NOT DETECTED Final   Enteroaggregative E coli (EAEC) NOT DETECTED NOT DETECTED Final   Enteropathogenic E coli (EPEC) NOT DETECTED NOT  DETECTED Final   Enterotoxigenic E coli (ETEC) NOT DETECTED NOT DETECTED Final   Shiga like toxin producing E coli (STEC) NOT DETECTED NOT DETECTED Final   Shigella/Enteroinvasive E coli (EIEC) NOT DETECTED NOT DETECTED Final   Cryptosporidium NOT DETECTED NOT DETECTED Final   Cyclospora cayetanensis NOT DETECTED NOT DETECTED Final   Entamoeba histolytica NOT DETECTED NOT DETECTED Final   Giardia lamblia NOT DETECTED NOT DETECTED Final   Adenovirus F40/41 NOT DETECTED NOT DETECTED Final   Astrovirus NOT DETECTED NOT DETECTED Final   Norovirus GI/GII DETECTED (A) NOT DETECTED Final    Comment: RESULT CALLED TO, READ BACK BY AND VERIFIED WITH: JOYYA BAYNUM, LPN @[id number]  09/20/2023 COP    Rotavirus A NOT DETECTED NOT DETECTED Final   Sapovirus (I, II, IV, and V) NOT DETECTED NOT DETECTED Final    Comment: Performed at [facility], [address] [place], [place] [address]  C Difficile Quick Screen w PCR reflex     Status: None   Collection Time: 09/19/23  8:00 PM   Specimen: STOOL  Result Value Ref Range Status   C Diff antigen NEGATIVE NEGATIVE Final   C Diff toxin NEGATIVE NEGATIVE Final   C Diff interpretation No C. difficile detected.  Final    Comment: Performed at [facility], [address] [place], [place] [address]   *Note: Due to a large number of results and/or encounters for the requested time period, some results have not been displayed. A complete set of results can be found in Results Review.    Labs: CBC: Recent Labs  Lab 09/25/23 0517 09/28/23 0534 10/01/23 0441  WBC 6.1 5.6 5.8  HGB 14.8 14.9 14.5  HCT 46.0 44.6 44.9  MCV 94.1 93.1 93.3  PLT 222 236 260   Basic Metabolic Panel: Recent Labs  Lab 09/27/23 0523 09/27/23 2303 09/28/23 0534 09/29/23 0332 09/30/23 0703 10/01/23 0441  NA 136  --  136 135 139 137  K 4.7  --  4.7 5.1 4.7 5.0  CL 105  --  104 104 103 106  CO2 22  --  21* 22 23 23   GLUCOSE 129*  --  109* 126* 139* 140*   BUN  48*  --  53* 53* 53* 53*  CREATININE 2.92*  --  3.18* 3.07* 3.00* 3.01*  CALCIUM  8.7*  --  8.9 8.8* 9.1 9.2  MG  --  2.3  --   --   --   --    Liver Function Tests: No results for input(s): AST, ALT, ALKPHOS, BILITOT, PROT, ALBUMIN  in the last 168 hours. CBG: Recent Labs  Lab 09/28/23 1135 09/28/23 1655 09/28/23 2159 09/29/23 0742 09/29/23 1157  GLUCAP 151* 84 129* 146* 186*    Discharge time spent: greater than 30 minutes.  Signed: Charlie Patterson, MD [facility] 10/01/2023

## 2023-10-03 DIAGNOSIS — I4891 Unspecified atrial fibrillation: Secondary | ICD-10-CM | POA: Diagnosis not present

## 2023-10-03 DIAGNOSIS — I739 Peripheral vascular disease, unspecified: Secondary | ICD-10-CM | POA: Diagnosis not present

## 2023-10-03 DIAGNOSIS — K219 Gastro-esophageal reflux disease without esophagitis: Secondary | ICD-10-CM | POA: Diagnosis not present

## 2023-10-03 DIAGNOSIS — K626 Ulcer of anus and rectum: Secondary | ICD-10-CM | POA: Diagnosis not present

## 2023-10-03 DIAGNOSIS — N179 Acute kidney failure, unspecified: Secondary | ICD-10-CM | POA: Diagnosis not present

## 2023-10-03 DIAGNOSIS — J449 Chronic obstructive pulmonary disease, unspecified: Secondary | ICD-10-CM | POA: Diagnosis not present

## 2023-10-03 DIAGNOSIS — Z86718 Personal history of other venous thrombosis and embolism: Secondary | ICD-10-CM | POA: Diagnosis not present

## 2023-10-03 DIAGNOSIS — I63412 Cerebral infarction due to embolism of left middle cerebral artery: Secondary | ICD-10-CM | POA: Diagnosis not present

## 2023-10-03 DIAGNOSIS — I509 Heart failure, unspecified: Secondary | ICD-10-CM | POA: Diagnosis not present

## 2023-10-03 DIAGNOSIS — G9340 Encephalopathy, unspecified: Secondary | ICD-10-CM | POA: Diagnosis not present

## 2023-10-03 DIAGNOSIS — N183 Chronic kidney disease, stage 3 unspecified: Secondary | ICD-10-CM | POA: Diagnosis not present

## 2023-10-06 ENCOUNTER — Other Ambulatory Visit: Payer: Self-pay

## 2023-10-06 ENCOUNTER — Other Ambulatory Visit (HOSPITAL_COMMUNITY): Payer: Self-pay

## 2023-10-06 DIAGNOSIS — K219 Gastro-esophageal reflux disease without esophagitis: Secondary | ICD-10-CM | POA: Diagnosis not present

## 2023-10-06 DIAGNOSIS — N1832 Chronic kidney disease, stage 3b: Secondary | ICD-10-CM | POA: Diagnosis not present

## 2023-10-06 DIAGNOSIS — G9340 Encephalopathy, unspecified: Secondary | ICD-10-CM | POA: Diagnosis not present

## 2023-10-06 DIAGNOSIS — I480 Paroxysmal atrial fibrillation: Secondary | ICD-10-CM | POA: Diagnosis not present

## 2023-10-06 DIAGNOSIS — E785 Hyperlipidemia, unspecified: Secondary | ICD-10-CM | POA: Diagnosis not present

## 2023-10-06 DIAGNOSIS — E1122 Type 2 diabetes mellitus with diabetic chronic kidney disease: Secondary | ICD-10-CM | POA: Diagnosis not present

## 2023-10-06 DIAGNOSIS — I5022 Chronic systolic (congestive) heart failure: Secondary | ICD-10-CM | POA: Diagnosis not present

## 2023-10-06 DIAGNOSIS — I739 Peripheral vascular disease, unspecified: Secondary | ICD-10-CM | POA: Diagnosis not present

## 2023-10-06 DIAGNOSIS — Z8673 Personal history of transient ischemic attack (TIA), and cerebral infarction without residual deficits: Secondary | ICD-10-CM | POA: Diagnosis not present

## 2023-10-06 DIAGNOSIS — J449 Chronic obstructive pulmonary disease, unspecified: Secondary | ICD-10-CM | POA: Diagnosis not present

## 2023-10-06 DIAGNOSIS — I130 Hypertensive heart and chronic kidney disease with heart failure and stage 1 through stage 4 chronic kidney disease, or unspecified chronic kidney disease: Secondary | ICD-10-CM | POA: Diagnosis not present

## 2023-10-08 ENCOUNTER — Ambulatory Visit: Admitting: Cardiology

## 2023-10-08 DIAGNOSIS — K626 Ulcer of anus and rectum: Secondary | ICD-10-CM | POA: Diagnosis not present

## 2023-10-08 DIAGNOSIS — E1122 Type 2 diabetes mellitus with diabetic chronic kidney disease: Secondary | ICD-10-CM | POA: Diagnosis not present

## 2023-10-08 DIAGNOSIS — I739 Peripheral vascular disease, unspecified: Secondary | ICD-10-CM | POA: Diagnosis not present

## 2023-10-08 DIAGNOSIS — I4891 Unspecified atrial fibrillation: Secondary | ICD-10-CM | POA: Diagnosis not present

## 2023-10-08 DIAGNOSIS — I509 Heart failure, unspecified: Secondary | ICD-10-CM | POA: Diagnosis not present

## 2023-10-08 DIAGNOSIS — I63412 Cerebral infarction due to embolism of left middle cerebral artery: Secondary | ICD-10-CM | POA: Diagnosis not present

## 2023-10-08 DIAGNOSIS — Z86718 Personal history of other venous thrombosis and embolism: Secondary | ICD-10-CM | POA: Diagnosis not present

## 2023-10-08 DIAGNOSIS — K219 Gastro-esophageal reflux disease without esophagitis: Secondary | ICD-10-CM | POA: Diagnosis not present

## 2023-10-08 DIAGNOSIS — J449 Chronic obstructive pulmonary disease, unspecified: Secondary | ICD-10-CM | POA: Diagnosis not present

## 2023-10-08 DIAGNOSIS — G9340 Encephalopathy, unspecified: Secondary | ICD-10-CM | POA: Diagnosis not present

## 2023-10-08 DIAGNOSIS — N179 Acute kidney failure, unspecified: Secondary | ICD-10-CM | POA: Diagnosis not present

## 2023-10-12 DIAGNOSIS — N200 Calculus of kidney: Secondary | ICD-10-CM | POA: Diagnosis not present

## 2023-10-12 DIAGNOSIS — I700 Atherosclerosis of aorta: Secondary | ICD-10-CM | POA: Diagnosis not present

## 2023-10-12 DIAGNOSIS — I959 Hypotension, unspecified: Secondary | ICD-10-CM | POA: Diagnosis not present

## 2023-10-12 DIAGNOSIS — D631 Anemia in chronic kidney disease: Secondary | ICD-10-CM | POA: Diagnosis not present

## 2023-10-12 DIAGNOSIS — I495 Sick sinus syndrome: Secondary | ICD-10-CM | POA: Diagnosis not present

## 2023-10-12 DIAGNOSIS — I472 Ventricular tachycardia, unspecified: Secondary | ICD-10-CM | POA: Diagnosis not present

## 2023-10-12 DIAGNOSIS — K5909 Other constipation: Secondary | ICD-10-CM | POA: Diagnosis not present

## 2023-10-12 DIAGNOSIS — I4821 Permanent atrial fibrillation: Secondary | ICD-10-CM | POA: Diagnosis not present

## 2023-10-12 DIAGNOSIS — N1832 Chronic kidney disease, stage 3b: Secondary | ICD-10-CM | POA: Diagnosis not present

## 2023-10-12 DIAGNOSIS — E785 Hyperlipidemia, unspecified: Secondary | ICD-10-CM | POA: Diagnosis not present

## 2023-10-12 DIAGNOSIS — K626 Ulcer of anus and rectum: Secondary | ICD-10-CM | POA: Diagnosis not present

## 2023-10-12 DIAGNOSIS — D630 Anemia in neoplastic disease: Secondary | ICD-10-CM | POA: Diagnosis not present

## 2023-10-12 DIAGNOSIS — K7689 Other specified diseases of liver: Secondary | ICD-10-CM | POA: Diagnosis not present

## 2023-10-12 DIAGNOSIS — S31839D Unspecified open wound of anus, subsequent encounter: Secondary | ICD-10-CM | POA: Diagnosis not present

## 2023-10-12 DIAGNOSIS — J449 Chronic obstructive pulmonary disease, unspecified: Secondary | ICD-10-CM | POA: Diagnosis not present

## 2023-10-12 DIAGNOSIS — G4733 Obstructive sleep apnea (adult) (pediatric): Secondary | ICD-10-CM | POA: Diagnosis not present

## 2023-10-12 DIAGNOSIS — I130 Hypertensive heart and chronic kidney disease with heart failure and stage 1 through stage 4 chronic kidney disease, or unspecified chronic kidney disease: Secondary | ICD-10-CM | POA: Diagnosis not present

## 2023-10-12 DIAGNOSIS — R1310 Dysphagia, unspecified: Secondary | ICD-10-CM | POA: Diagnosis not present

## 2023-10-12 DIAGNOSIS — I5042 Chronic combined systolic (congestive) and diastolic (congestive) heart failure: Secondary | ICD-10-CM | POA: Diagnosis not present

## 2023-10-12 DIAGNOSIS — E1151 Type 2 diabetes mellitus with diabetic peripheral angiopathy without gangrene: Secondary | ICD-10-CM | POA: Diagnosis not present

## 2023-10-12 DIAGNOSIS — E1122 Type 2 diabetes mellitus with diabetic chronic kidney disease: Secondary | ICD-10-CM | POA: Diagnosis not present

## 2023-10-12 DIAGNOSIS — N179 Acute kidney failure, unspecified: Secondary | ICD-10-CM | POA: Diagnosis not present

## 2023-10-13 ENCOUNTER — Telehealth: Payer: Self-pay

## 2023-10-13 ENCOUNTER — Telehealth: Admitting: Nurse Practitioner

## 2023-10-13 ENCOUNTER — Encounter: Payer: Self-pay | Admitting: Nurse Practitioner

## 2023-10-13 VITALS — Resp 16 | Ht 73.0 in | Wt 218.0 lb

## 2023-10-13 DIAGNOSIS — R5381 Other malaise: Secondary | ICD-10-CM | POA: Diagnosis not present

## 2023-10-13 DIAGNOSIS — R4701 Aphasia: Secondary | ICD-10-CM | POA: Diagnosis not present

## 2023-10-13 DIAGNOSIS — I6930 Unspecified sequelae of cerebral infarction: Secondary | ICD-10-CM

## 2023-10-13 DIAGNOSIS — I252 Old myocardial infarction: Secondary | ICD-10-CM

## 2023-10-13 DIAGNOSIS — Z09 Encounter for follow-up examination after completed treatment for conditions other than malignant neoplasm: Secondary | ICD-10-CM

## 2023-10-13 NOTE — Telephone Encounter (Signed)
 Myrick, RN from [facility] called today to report that patient's focus of care is heart failure. At her visit she noted light bleeding/rectal tear. Desitin could be helpful for irritation in fold of buttocks. Home health/PT/OT will come out daily. Swelling was under control in legs, vitals and blood sugar stable. Niece to start Ozempic  weekly for patient. Suggested to check blood sugars once a day. Gave verbal OK for all orders. To reach [place], use [phone].

## 2023-10-13 NOTE — Progress Notes (Signed)
 [facility] [name], [name] [address] [place] [address] [phone]                                   Transitional Care Clinic   [facility] Discharge Acute Issues Care Follow Up                                                                        Patient Demographics  [name], is a 89 y.o. male  DOB Feb 17, 1933  MRN [id number].  Primary MD  Liana Fish, NP  I connected with the patient at 1000 by telephone and verified the patients identity using two identifiers.   I discussed the limitations, risks, security and privacy concerns of performing an evaluation and management service by telephone and the availability of in person appointments. I also discussed with the patient that there may be a patient responsible charge related to the service.  The patient expressed understanding and agrees to proceed.    Admit date:     09/18/2023  Discharge date: 10/01/23    Reason for TCC follow Up - acute encephalopathy   Past Medical History:  Diagnosis Date   Acute deep vein thrombosis (DVT) of femoral vein of left lower extremity (HCC)    Atrial fibrillation (HCC)    CHF (congestive heart failure) (HCC)    Chronic kidney disease    Diabetes (HCC)    Hearing loss    Hyperlipidemia    Liver cyst    Nephrolithiasis    OSA (obstructive sleep apnea)    Prostate cancer (HCC)    SSS (sick sinus syndrome) (HCC)    Stroke (HCC)    Valvular heart disease     Past Surgical History:  Procedure Laterality Date   CATARACT EXTRACTION     COLONOSCOPY WITH PROPOFOL  N/A 07/02/2019   Procedure: COLONOSCOPY WITH PROPOFOL ;  Surgeon: Unk Corinn Skiff, MD;  Location: [facility] ENDOSCOPY;  Service: Gastroenterology;  Laterality: N/A;   ESOPHAGOGASTRODUODENOSCOPY (EGD) WITH PROPOFOL  N/A 03/19/2022   Procedure: ESOPHAGOGASTRODUODENOSCOPY (EGD) WITH PROPOFOL ;  Surgeon: Therisa Bi, MD;  Location: [facility] ENDOSCOPY;  Service: Gastroenterology;  Laterality: N/A;    FLEXIBLE SIGMOIDOSCOPY N/A 09/23/2023   Procedure: SIGMOIDOSCOPY, FLEXIBLE;  Surgeon: Toledo, Ladell POUR, MD;  Location: [facility] ENDOSCOPY;  Service: Gastroenterology;  Laterality: N/A;   IR CT HEAD LTD  08/27/2019   IR CT HEAD LTD  06/28/2023   IR PERCUTANEOUS ART THROMBECTOMY/INFUSION INTRACRANIAL INC DIAG ANGIO  08/27/2019       IR PERCUTANEOUS ART THROMBECTOMY/INFUSION INTRACRANIAL INC DIAG ANGIO  08/27/2019   IR PERCUTANEOUS ART THROMBECTOMY/INFUSION INTRACRANIAL INC DIAG ANGIO  06/28/2023   LEFT HEART CATH AND CORONARY ANGIOGRAPHY N/A 06/24/2023   Procedure: LEFT HEART CATH AND CORONARY ANGIOGRAPHY;  Surgeon: Ammon Blunt, MD;  Location: [facility] INVASIVE CV LAB;  Service: Cardiovascular;  Laterality: N/A;   LOWER EXTREMITY ANGIOGRAPHY Left 12/25/2020   Procedure: Lower Extremity Angiography;  Surgeon: Marea Selinda RAMAN, MD;  Location: [facility] INVASIVE CV LAB;  Service: Cardiovascular;  Laterality: Left;   PACEMAKER LEADLESS INSERTION N/A 12/19/2020   Procedure: PACEMAKER LEADLESS INSERTION;  Surgeon: Ammon Blunt, MD;  Location: [facility] INVASIVE CV  LAB;  Service: Cardiovascular;  Laterality: N/A;   PROSTATE CANCER     PROSTATE SURGERY     RADIOLOGY WITH ANESTHESIA N/A 08/27/2019   Procedure: IR WITH ANESTHESIA;  Surgeon: Dolphus Carrion, MD;  Location: [facility] OR;  Service: Radiology;  Laterality: N/A;   RADIOLOGY WITH ANESTHESIA N/A 06/28/2023   Procedure: RADIOLOGY WITH ANESTHESIA;  Surgeon: Radiologist, Medication, MD;  Location: [facility] OR;  Service: Radiology;  Laterality: N/A;       Recent HPI and Hospital Course  Hospital Course: Hospital course / significant events:    HPI: [name] is a 89 y.o. male with medical history significant of hypertension, hyperlipidemia, T2DM, permanent atrial fibrillation on edoxaban , SSS s/p PPM, HFrEF with EF of 45 to 50% -06/24/2023), V-Tach on amiodarone , stage IVb metastatic prostate cancer on Xtandi  and leuprolide  who presents to the emergency department  from home via EMS due to 2-week onset of progressive worsening confusion.  Patient was unable to provide history, history was obtained from EDP, ED medical record and daughter-in-law at bedside.  Per report, patient lives at home with wife who has dementia and he was able to take care of his ADLs and care for his wife.  However, since past 2-3 weeks, family checked on him from time to time noted changes in cognition and several episodes of diarrhea.  It was also noted that he has not been taking his medications including amiodarone  as prescribed (he was taking more pills than he was supposed to), assumptions made based on number of pills in his pillbox.  His occupational therapist followed up with him and noted he was covered in stool and was more confused, so he requested for him to be taken to the ED for further evaluation and management.    Of note: was admitted from 6/2 to 6/6 due to monomorphic V. tach during which he underwent t LHC with Dr. Ammon (06/03) and revealed 40% stenosed Ost/prox LAD, mild LV systolic dysfunction. He was also admitted from 6/7 to 6/20 due to left MCA infarct with left M1 occlusion s/p mechanical thrombectomy with TICI3 revascularization, etiology likely A-fib on DOAC in the setting of recent cardioversion.    On this admission Encephalopathy likely multifactorial w/ recent stroke affecting mentation. MRI nothing acute, no anemia, no electrolyte abnormalities, normal tsh, normal LFTs. Diarrhea/dehydration w/ AKI also might be contirbuting (+)Norovirus explains diarrhea  Hematochezia - appreciate GI input.  Patient had a flexible sigmoidoscopy which showed a rectal ulcer. Record review previously there was plan to stop anticoagulation but pt appears to have been taking this at home. Will dc this medication.  Renal function has continued to worsen, 09/26/23 trial fluids (low po intake) still worsening, not improving --> renal US , repeat UA, get postvoid residual, nephro  consult - suspect advancing CKD? Per nephrology, ok to follow on discharge --> continues to be worse into 09/07 but better into 09/08, no change to plan per nephrology as UOP is ok, no need dialysis    09/30/23 [facility] can not take him until tomorrow       [facility] Acute Care Issue to be followed in the Clinic   Acute metabolic encephalopathy Active Problems:   Acute kidney injury superimposed on stage 4 chronic kidney disease (HCC)   Monomorphic ventricular tachycardia (HCC)   Essential hypertension   OSA (obstructive sleep apnea)   Elevated troponin   Type 2 diabetes mellitus with hyperglycemia (HCC)   COPD (chronic obstructive pulmonary disease) (HCC)   Prostate  cancer (HCC)   (HFpEF) heart failure with preserved ejection fraction (HCC)   S/P placement of leadless cardiac pacemaker   Prolonged QT interval   Elevated brain natriuretic peptide (BNP) level   Hypoalbuminemia due to protein-calorie malnutrition (HCC)   Acute on chronic combined systolic and diastolic CHF (congestive heart failure) (HCC)   Permanent atrial fibrillation with RVR (HCC)   History of ischemic stroke   History of DVT (deep vein thrombosis)   History of GI bleed   Norovirus   Hematochezia   Debility   Subjective:   [name] today has, No headache, No chest pain, No abdominal pain - No Nausea, No new weakness tingling or numbness, No Cough - SOB. He has residual weakness, aphasia and baseline confusion at this time.   Assessment & Plan   1. Declining functional status (Primary) Patient's daughter-in-law is considering nursing home placement but needs to look into local nursing homes.   2. Global aphasia Followed by speech therapy  3. History of stroke with residual effects Noted, currently not working, and will not be able to work in the future. FMLA, and short-term disability are being completed.   4. History of non-ST elevation myocardial infarction (NSTEMI) Noted,  followed by cardiology   5. Hospital discharge follow-up Some improvement with treatment in the hospital but patient is limited physically and mentally due to deficits from the stroke.      Reason for frequent admissions/ER visits    history of stroke History of MI Aphasia Diabetes CKD    Objective:   Vitals:   10/13/23 0939  Resp: 16  Weight: 218 lb (98.9 kg)  Height: 6' 1 (1.854 m)    Wt Readings from Last 3 Encounters:  10/31/23 209 lb (94.8 kg)  10/23/23 200 lb (90.7 kg)  10/13/23 218 lb (98.9 kg)    Allergies as of 10/13/2023   No Known Allergies      Medication List        Accurate as of October 13, 2023 11:59 PM. If you have any questions, ask your nurse or doctor.          acetaminophen  325 MG tablet Commonly known as: TYLENOL  Take 2 tablets (650 mg total) by mouth every 6 (six) hours as needed for mild pain (pain score 1-3) or fever (or Fever >/= 101).   albuterol  108 (90 Base) MCG/ACT inhaler Commonly known as: VENTOLIN  HFA Inhale 2 puffs into the lungs every 4 (four) hours as needed for wheezing or shortness of breath.   amiodarone  200 MG tablet Commonly known as: PACERONE  Take 1 tablet (200 mg total) by mouth daily.   atorvastatin  10 MG tablet Commonly known as: LIPITOR Take 1 tablet (10 mg total) by mouth daily.   cyanocobalamin  1000 MCG tablet Commonly known as: VITAMIN B12 Take 1 tablet (1,000 mcg total) by mouth daily.   feeding supplement (GLUCERNA SHAKE) Liqd Take 237 mLs by mouth 3 (three) times daily between meals.   FeroSul 325 (65 FE) MG tablet Generic drug: ferrous sulfate  Take 1 tablet by mouth daily.   loperamide  2 MG capsule Commonly known as: IMODIUM  Take 1 capsule (2 mg total) by mouth every 8 (eight) hours as needed for diarrhea or loose stools.   nitroGLYCERIN  0.4 MG SL tablet Commonly known as: NITROSTAT  Place 1 tablet (0.4 mg total) under the tongue every 5 (five) minutes as needed for chest pain.    omeprazole 20 MG capsule Commonly known as: PRILOSEC Take 20 mg by mouth  daily.   Ozempic  (0.25 or 0.5 MG/DOSE) 2 MG/3ML Sopn Generic drug: Semaglutide (0.25 or 0.5MG /DOS) Inject 0.5 mg into the skin once a week.   Savaysa  30 MG Tabs tablet Generic drug: edoxaban  Take 30 mg by mouth daily.   Spiriva  Respimat 2.5 MCG/ACT Aers Generic drug: Tiotropium Bromide  Inhale 2 puffs into the lungs daily.   verapamil  120 MG CR tablet Commonly known as: CALAN -SR Take 1 tablet (120 mg total) by mouth at bedtime.   VITAMIN D-3 PO Take 1 capsule by mouth daily.   Xtandi  40 MG tablet Generic drug: enzalutamide  Take 4 tablets (160 mg total) by mouth daily.         Objective data: patient is unable to interact over the telephone. His daughter in law conducted the phone visit.     Data Review   Micro Results No results found for this or any previous visit (from the past 240 hours).   CBC No results for input(s): WBC, HGB, HCT, PLT, MCV, MCH, MCHC, RDW, LYMPHSABS, MONOABS, EOSABS, BASOSABS, BANDABS in the last 168 hours.  Invalid input(s): NEUTRABS, BANDSABD  Chemistries  No results for input(s): NA, K, CL, CO2, GLUCOSE, BUN, CREATININE, CALCIUM , MG, AST, ALT, ALKPHOS, BILITOT in the last 168 hours.  Invalid input(s): GFRCGP ------------------------------------------------------------------------------------------------------------------ CrCl cannot be calculated (Patient's most recent lab result is older than the maximum 21 days allowed.). ------------------------------------------------------------------------------------------------------------------ No results for input(s): HGBA1C in the last 72 hours. ------------------------------------------------------------------------------------------------------------------ No results for input(s): CHOL, HDL, LDLCALC, TRIG, CHOLHDL, LDLDIRECT in the last 72  hours. ------------------------------------------------------------------------------------------------------------------ No results for input(s): TSH, T4TOTAL, T3FREE, THYROIDAB in the last 72 hours.  Invalid input(s): FREET3 ------------------------------------------------------------------------------------------------------------------ No results for input(s): VITAMINB12, FOLATE, FERRITIN, TIBC, IRON, RETICCTPCT in the last 72 hours.  Coagulation profile No results for input(s): INR, PROTIME in the last 168 hours.  No results for input(s): DDIMER in the last 72 hours.  Cardiac Enzymes No results for input(s): CKMB, TROPONINI, MYOGLOBIN in the last 168 hours.  Invalid input(s): CK ------------------------------------------------------------------------------------------------------------------ Invalid input(s): POCBNP  Return in about 1 month (around 11/12/2023) for F/U, Alexiana Laverdure PCP.   Time Spent in minutes  45 Time spent with patient included reviewing progress notes, labs, imaging studies, and discussing plan for follow up.    This patient was seen by Mardy Maxin, FNP-C in collaboration with Dr. Sigrid Bathe as a part of collaborative care agreement.    Mardy Maxin MSN, FNP-C on 10/13/2023 at 10:08 AM   **Disclaimer: This note may have been dictated with voice recognition software. Similar sounding words can inadvertently be transcribed and this note may contain transcription errors which may not have been corrected upon publication of note.**

## 2023-10-14 ENCOUNTER — Telehealth: Payer: Self-pay | Admitting: Nurse Practitioner

## 2023-10-14 DIAGNOSIS — I5042 Chronic combined systolic (congestive) and diastolic (congestive) heart failure: Secondary | ICD-10-CM | POA: Diagnosis not present

## 2023-10-14 DIAGNOSIS — E1122 Type 2 diabetes mellitus with diabetic chronic kidney disease: Secondary | ICD-10-CM | POA: Diagnosis not present

## 2023-10-14 DIAGNOSIS — I959 Hypotension, unspecified: Secondary | ICD-10-CM | POA: Diagnosis not present

## 2023-10-14 DIAGNOSIS — D630 Anemia in neoplastic disease: Secondary | ICD-10-CM | POA: Diagnosis not present

## 2023-10-14 DIAGNOSIS — G4733 Obstructive sleep apnea (adult) (pediatric): Secondary | ICD-10-CM | POA: Diagnosis not present

## 2023-10-14 DIAGNOSIS — D631 Anemia in chronic kidney disease: Secondary | ICD-10-CM | POA: Diagnosis not present

## 2023-10-14 DIAGNOSIS — N179 Acute kidney failure, unspecified: Secondary | ICD-10-CM | POA: Diagnosis not present

## 2023-10-14 DIAGNOSIS — N200 Calculus of kidney: Secondary | ICD-10-CM | POA: Diagnosis not present

## 2023-10-14 DIAGNOSIS — K5909 Other constipation: Secondary | ICD-10-CM | POA: Diagnosis not present

## 2023-10-14 DIAGNOSIS — K626 Ulcer of anus and rectum: Secondary | ICD-10-CM | POA: Diagnosis not present

## 2023-10-14 DIAGNOSIS — E1151 Type 2 diabetes mellitus with diabetic peripheral angiopathy without gangrene: Secondary | ICD-10-CM | POA: Diagnosis not present

## 2023-10-14 DIAGNOSIS — E785 Hyperlipidemia, unspecified: Secondary | ICD-10-CM | POA: Diagnosis not present

## 2023-10-14 DIAGNOSIS — S31839D Unspecified open wound of anus, subsequent encounter: Secondary | ICD-10-CM | POA: Diagnosis not present

## 2023-10-14 DIAGNOSIS — K7689 Other specified diseases of liver: Secondary | ICD-10-CM | POA: Diagnosis not present

## 2023-10-14 DIAGNOSIS — I700 Atherosclerosis of aorta: Secondary | ICD-10-CM | POA: Diagnosis not present

## 2023-10-14 DIAGNOSIS — I4821 Permanent atrial fibrillation: Secondary | ICD-10-CM | POA: Diagnosis not present

## 2023-10-14 DIAGNOSIS — I472 Ventricular tachycardia, unspecified: Secondary | ICD-10-CM | POA: Diagnosis not present

## 2023-10-14 DIAGNOSIS — I495 Sick sinus syndrome: Secondary | ICD-10-CM | POA: Diagnosis not present

## 2023-10-14 DIAGNOSIS — R1310 Dysphagia, unspecified: Secondary | ICD-10-CM | POA: Diagnosis not present

## 2023-10-14 DIAGNOSIS — N1832 Chronic kidney disease, stage 3b: Secondary | ICD-10-CM | POA: Diagnosis not present

## 2023-10-14 DIAGNOSIS — I130 Hypertensive heart and chronic kidney disease with heart failure and stage 1 through stage 4 chronic kidney disease, or unspecified chronic kidney disease: Secondary | ICD-10-CM | POA: Diagnosis not present

## 2023-10-14 DIAGNOSIS — J449 Chronic obstructive pulmonary disease, unspecified: Secondary | ICD-10-CM | POA: Diagnosis not present

## 2023-10-14 NOTE — Telephone Encounter (Signed)
 Provider part of disability form completed by Alyssa. Per Tiffany. Form faxed to [employer]; [phone]. Patient's part already sent to them. Scanned form. @ font desk for Tiffany to p/u-[name]

## 2023-10-15 ENCOUNTER — Other Ambulatory Visit: Payer: Self-pay

## 2023-10-15 ENCOUNTER — Telehealth: Payer: Self-pay | Admitting: Nurse Practitioner

## 2023-10-15 DIAGNOSIS — D630 Anemia in neoplastic disease: Secondary | ICD-10-CM | POA: Diagnosis not present

## 2023-10-15 DIAGNOSIS — E1122 Type 2 diabetes mellitus with diabetic chronic kidney disease: Secondary | ICD-10-CM | POA: Diagnosis not present

## 2023-10-15 DIAGNOSIS — K5909 Other constipation: Secondary | ICD-10-CM | POA: Diagnosis not present

## 2023-10-15 DIAGNOSIS — I472 Ventricular tachycardia, unspecified: Secondary | ICD-10-CM | POA: Diagnosis not present

## 2023-10-15 DIAGNOSIS — D631 Anemia in chronic kidney disease: Secondary | ICD-10-CM | POA: Diagnosis not present

## 2023-10-15 DIAGNOSIS — K7689 Other specified diseases of liver: Secondary | ICD-10-CM | POA: Diagnosis not present

## 2023-10-15 DIAGNOSIS — K626 Ulcer of anus and rectum: Secondary | ICD-10-CM | POA: Diagnosis not present

## 2023-10-15 DIAGNOSIS — I4821 Permanent atrial fibrillation: Secondary | ICD-10-CM | POA: Diagnosis not present

## 2023-10-15 DIAGNOSIS — I495 Sick sinus syndrome: Secondary | ICD-10-CM | POA: Diagnosis not present

## 2023-10-15 DIAGNOSIS — I5042 Chronic combined systolic (congestive) and diastolic (congestive) heart failure: Secondary | ICD-10-CM | POA: Diagnosis not present

## 2023-10-15 DIAGNOSIS — R1310 Dysphagia, unspecified: Secondary | ICD-10-CM | POA: Diagnosis not present

## 2023-10-15 DIAGNOSIS — E785 Hyperlipidemia, unspecified: Secondary | ICD-10-CM | POA: Diagnosis not present

## 2023-10-15 DIAGNOSIS — I700 Atherosclerosis of aorta: Secondary | ICD-10-CM | POA: Diagnosis not present

## 2023-10-15 DIAGNOSIS — N200 Calculus of kidney: Secondary | ICD-10-CM | POA: Diagnosis not present

## 2023-10-15 DIAGNOSIS — N179 Acute kidney failure, unspecified: Secondary | ICD-10-CM | POA: Diagnosis not present

## 2023-10-15 DIAGNOSIS — J449 Chronic obstructive pulmonary disease, unspecified: Secondary | ICD-10-CM | POA: Diagnosis not present

## 2023-10-15 DIAGNOSIS — N1832 Chronic kidney disease, stage 3b: Secondary | ICD-10-CM | POA: Diagnosis not present

## 2023-10-15 DIAGNOSIS — E1151 Type 2 diabetes mellitus with diabetic peripheral angiopathy without gangrene: Secondary | ICD-10-CM | POA: Diagnosis not present

## 2023-10-15 DIAGNOSIS — G4733 Obstructive sleep apnea (adult) (pediatric): Secondary | ICD-10-CM | POA: Diagnosis not present

## 2023-10-15 DIAGNOSIS — I959 Hypotension, unspecified: Secondary | ICD-10-CM | POA: Diagnosis not present

## 2023-10-15 DIAGNOSIS — S31839D Unspecified open wound of anus, subsequent encounter: Secondary | ICD-10-CM | POA: Diagnosis not present

## 2023-10-15 DIAGNOSIS — I130 Hypertensive heart and chronic kidney disease with heart failure and stage 1 through stage 4 chronic kidney disease, or unspecified chronic kidney disease: Secondary | ICD-10-CM | POA: Diagnosis not present

## 2023-10-15 NOTE — Telephone Encounter (Signed)
 Completed & blank disability form faxed to [employer] again; [phone], and to Tiffany; [phone]

## 2023-10-16 ENCOUNTER — Other Ambulatory Visit: Payer: Self-pay

## 2023-10-16 ENCOUNTER — Telehealth: Payer: Self-pay

## 2023-10-16 ENCOUNTER — Other Ambulatory Visit (HOSPITAL_COMMUNITY): Payer: Self-pay

## 2023-10-16 DIAGNOSIS — E1151 Type 2 diabetes mellitus with diabetic peripheral angiopathy without gangrene: Secondary | ICD-10-CM | POA: Diagnosis not present

## 2023-10-16 DIAGNOSIS — N1832 Chronic kidney disease, stage 3b: Secondary | ICD-10-CM | POA: Diagnosis not present

## 2023-10-16 DIAGNOSIS — G4733 Obstructive sleep apnea (adult) (pediatric): Secondary | ICD-10-CM | POA: Diagnosis not present

## 2023-10-16 DIAGNOSIS — D630 Anemia in neoplastic disease: Secondary | ICD-10-CM | POA: Diagnosis not present

## 2023-10-16 DIAGNOSIS — E1122 Type 2 diabetes mellitus with diabetic chronic kidney disease: Secondary | ICD-10-CM | POA: Diagnosis not present

## 2023-10-16 DIAGNOSIS — I472 Ventricular tachycardia, unspecified: Secondary | ICD-10-CM | POA: Diagnosis not present

## 2023-10-16 DIAGNOSIS — N179 Acute kidney failure, unspecified: Secondary | ICD-10-CM | POA: Diagnosis not present

## 2023-10-16 DIAGNOSIS — E785 Hyperlipidemia, unspecified: Secondary | ICD-10-CM | POA: Diagnosis not present

## 2023-10-16 DIAGNOSIS — I700 Atherosclerosis of aorta: Secondary | ICD-10-CM | POA: Diagnosis not present

## 2023-10-16 DIAGNOSIS — D631 Anemia in chronic kidney disease: Secondary | ICD-10-CM | POA: Diagnosis not present

## 2023-10-16 DIAGNOSIS — I130 Hypertensive heart and chronic kidney disease with heart failure and stage 1 through stage 4 chronic kidney disease, or unspecified chronic kidney disease: Secondary | ICD-10-CM | POA: Diagnosis not present

## 2023-10-16 DIAGNOSIS — R1310 Dysphagia, unspecified: Secondary | ICD-10-CM | POA: Diagnosis not present

## 2023-10-16 DIAGNOSIS — I495 Sick sinus syndrome: Secondary | ICD-10-CM | POA: Diagnosis not present

## 2023-10-16 DIAGNOSIS — I959 Hypotension, unspecified: Secondary | ICD-10-CM | POA: Diagnosis not present

## 2023-10-16 DIAGNOSIS — N200 Calculus of kidney: Secondary | ICD-10-CM | POA: Diagnosis not present

## 2023-10-16 DIAGNOSIS — K7689 Other specified diseases of liver: Secondary | ICD-10-CM | POA: Diagnosis not present

## 2023-10-16 DIAGNOSIS — I4821 Permanent atrial fibrillation: Secondary | ICD-10-CM | POA: Diagnosis not present

## 2023-10-16 DIAGNOSIS — J449 Chronic obstructive pulmonary disease, unspecified: Secondary | ICD-10-CM | POA: Diagnosis not present

## 2023-10-16 DIAGNOSIS — I5042 Chronic combined systolic (congestive) and diastolic (congestive) heart failure: Secondary | ICD-10-CM | POA: Diagnosis not present

## 2023-10-16 DIAGNOSIS — K626 Ulcer of anus and rectum: Secondary | ICD-10-CM | POA: Diagnosis not present

## 2023-10-16 DIAGNOSIS — S31839D Unspecified open wound of anus, subsequent encounter: Secondary | ICD-10-CM | POA: Diagnosis not present

## 2023-10-16 DIAGNOSIS — K5909 Other constipation: Secondary | ICD-10-CM | POA: Diagnosis not present

## 2023-10-16 NOTE — Telephone Encounter (Signed)
 Ana from [facility] called to report patient's left leg is causing severe pain and he can not move it. Ana asked for verbal approval for mobile x-ray to go out to the patient's home for imaging. Per AA, ok to approve. Advised Ana and patient/patient's wife that if x-ray is clear to please go to the ER for further evaluation. To contact [place], [phone].

## 2023-10-17 ENCOUNTER — Telehealth: Payer: Self-pay | Admitting: Nurse Practitioner

## 2023-10-17 DIAGNOSIS — S31839D Unspecified open wound of anus, subsequent encounter: Secondary | ICD-10-CM | POA: Diagnosis not present

## 2023-10-17 DIAGNOSIS — K7689 Other specified diseases of liver: Secondary | ICD-10-CM | POA: Diagnosis not present

## 2023-10-17 DIAGNOSIS — J449 Chronic obstructive pulmonary disease, unspecified: Secondary | ICD-10-CM | POA: Diagnosis not present

## 2023-10-17 DIAGNOSIS — R1310 Dysphagia, unspecified: Secondary | ICD-10-CM | POA: Diagnosis not present

## 2023-10-17 DIAGNOSIS — D631 Anemia in chronic kidney disease: Secondary | ICD-10-CM | POA: Diagnosis not present

## 2023-10-17 DIAGNOSIS — E785 Hyperlipidemia, unspecified: Secondary | ICD-10-CM | POA: Diagnosis not present

## 2023-10-17 DIAGNOSIS — I959 Hypotension, unspecified: Secondary | ICD-10-CM | POA: Diagnosis not present

## 2023-10-17 DIAGNOSIS — N179 Acute kidney failure, unspecified: Secondary | ICD-10-CM | POA: Diagnosis not present

## 2023-10-17 DIAGNOSIS — I4821 Permanent atrial fibrillation: Secondary | ICD-10-CM | POA: Diagnosis not present

## 2023-10-17 DIAGNOSIS — D630 Anemia in neoplastic disease: Secondary | ICD-10-CM | POA: Diagnosis not present

## 2023-10-17 DIAGNOSIS — I130 Hypertensive heart and chronic kidney disease with heart failure and stage 1 through stage 4 chronic kidney disease, or unspecified chronic kidney disease: Secondary | ICD-10-CM | POA: Diagnosis not present

## 2023-10-17 DIAGNOSIS — I472 Ventricular tachycardia, unspecified: Secondary | ICD-10-CM | POA: Diagnosis not present

## 2023-10-17 DIAGNOSIS — G4733 Obstructive sleep apnea (adult) (pediatric): Secondary | ICD-10-CM | POA: Diagnosis not present

## 2023-10-17 DIAGNOSIS — N200 Calculus of kidney: Secondary | ICD-10-CM | POA: Diagnosis not present

## 2023-10-17 DIAGNOSIS — K626 Ulcer of anus and rectum: Secondary | ICD-10-CM | POA: Diagnosis not present

## 2023-10-17 DIAGNOSIS — K5909 Other constipation: Secondary | ICD-10-CM | POA: Diagnosis not present

## 2023-10-17 DIAGNOSIS — E1151 Type 2 diabetes mellitus with diabetic peripheral angiopathy without gangrene: Secondary | ICD-10-CM | POA: Diagnosis not present

## 2023-10-17 DIAGNOSIS — E1122 Type 2 diabetes mellitus with diabetic chronic kidney disease: Secondary | ICD-10-CM | POA: Diagnosis not present

## 2023-10-17 DIAGNOSIS — I495 Sick sinus syndrome: Secondary | ICD-10-CM | POA: Diagnosis not present

## 2023-10-17 DIAGNOSIS — I5042 Chronic combined systolic (congestive) and diastolic (congestive) heart failure: Secondary | ICD-10-CM | POA: Diagnosis not present

## 2023-10-17 DIAGNOSIS — I700 Atherosclerosis of aorta: Secondary | ICD-10-CM | POA: Diagnosis not present

## 2023-10-17 DIAGNOSIS — N1832 Chronic kidney disease, stage 3b: Secondary | ICD-10-CM | POA: Diagnosis not present

## 2023-10-17 NOTE — Telephone Encounter (Signed)
 Received mobile imaging order from [facility]. Signed by Mardy. Faxed back; [phone]. Scanned-[name]

## 2023-10-20 ENCOUNTER — Other Ambulatory Visit: Payer: Self-pay

## 2023-10-20 ENCOUNTER — Telehealth: Payer: Self-pay

## 2023-10-20 DIAGNOSIS — D630 Anemia in neoplastic disease: Secondary | ICD-10-CM | POA: Diagnosis not present

## 2023-10-20 DIAGNOSIS — K5909 Other constipation: Secondary | ICD-10-CM | POA: Diagnosis not present

## 2023-10-20 DIAGNOSIS — I959 Hypotension, unspecified: Secondary | ICD-10-CM | POA: Diagnosis not present

## 2023-10-20 DIAGNOSIS — E1151 Type 2 diabetes mellitus with diabetic peripheral angiopathy without gangrene: Secondary | ICD-10-CM | POA: Diagnosis not present

## 2023-10-20 DIAGNOSIS — I495 Sick sinus syndrome: Secondary | ICD-10-CM | POA: Diagnosis not present

## 2023-10-20 DIAGNOSIS — I4821 Permanent atrial fibrillation: Secondary | ICD-10-CM | POA: Diagnosis not present

## 2023-10-20 DIAGNOSIS — E785 Hyperlipidemia, unspecified: Secondary | ICD-10-CM | POA: Diagnosis not present

## 2023-10-20 DIAGNOSIS — K7689 Other specified diseases of liver: Secondary | ICD-10-CM | POA: Diagnosis not present

## 2023-10-20 DIAGNOSIS — G4733 Obstructive sleep apnea (adult) (pediatric): Secondary | ICD-10-CM | POA: Diagnosis not present

## 2023-10-20 DIAGNOSIS — N1832 Chronic kidney disease, stage 3b: Secondary | ICD-10-CM | POA: Diagnosis not present

## 2023-10-20 DIAGNOSIS — N179 Acute kidney failure, unspecified: Secondary | ICD-10-CM | POA: Diagnosis not present

## 2023-10-20 DIAGNOSIS — I130 Hypertensive heart and chronic kidney disease with heart failure and stage 1 through stage 4 chronic kidney disease, or unspecified chronic kidney disease: Secondary | ICD-10-CM | POA: Diagnosis not present

## 2023-10-20 DIAGNOSIS — S31839D Unspecified open wound of anus, subsequent encounter: Secondary | ICD-10-CM | POA: Diagnosis not present

## 2023-10-20 DIAGNOSIS — E1122 Type 2 diabetes mellitus with diabetic chronic kidney disease: Secondary | ICD-10-CM | POA: Diagnosis not present

## 2023-10-20 DIAGNOSIS — J449 Chronic obstructive pulmonary disease, unspecified: Secondary | ICD-10-CM | POA: Diagnosis not present

## 2023-10-20 DIAGNOSIS — R1310 Dysphagia, unspecified: Secondary | ICD-10-CM | POA: Diagnosis not present

## 2023-10-20 DIAGNOSIS — I700 Atherosclerosis of aorta: Secondary | ICD-10-CM | POA: Diagnosis not present

## 2023-10-20 DIAGNOSIS — D631 Anemia in chronic kidney disease: Secondary | ICD-10-CM | POA: Diagnosis not present

## 2023-10-20 DIAGNOSIS — I472 Ventricular tachycardia, unspecified: Secondary | ICD-10-CM | POA: Diagnosis not present

## 2023-10-20 DIAGNOSIS — K626 Ulcer of anus and rectum: Secondary | ICD-10-CM | POA: Diagnosis not present

## 2023-10-20 DIAGNOSIS — N200 Calculus of kidney: Secondary | ICD-10-CM | POA: Diagnosis not present

## 2023-10-20 DIAGNOSIS — I5042 Chronic combined systolic (congestive) and diastolic (congestive) heart failure: Secondary | ICD-10-CM | POA: Diagnosis not present

## 2023-10-20 NOTE — Progress Notes (Signed)
 Specialty Pharmacy Refill Coordination Note  [name] is a 89 y.o. male contacted today regarding refills of specialty medication(s) Enzalutamide  (XTANDI )   Patient requested Delivery   Delivery date: 10/22/23   Verified address: [address] [place] [facility] [address]   Medication will be filled on 10/21/23.

## 2023-10-20 NOTE — Telephone Encounter (Addendum)
 Pt daughter advised as per [name]  left xray result is normal if he is not feeling better still having pain and going on for 1 week go to ED

## 2023-10-21 ENCOUNTER — Telehealth: Payer: Self-pay

## 2023-10-21 DIAGNOSIS — G4733 Obstructive sleep apnea (adult) (pediatric): Secondary | ICD-10-CM | POA: Diagnosis not present

## 2023-10-21 NOTE — Patient Outreach (Signed)
 First telephone outreach attempt to obtain mRS. No answer. Left message for returned call.  [facility]  Population Health Care Management Assistant  Direct Dial: [phone]  Fax: [phone] Website: [email or web address]

## 2023-10-22 ENCOUNTER — Telehealth: Payer: Self-pay

## 2023-10-22 NOTE — Patient Outreach (Signed)
 Second telephone outreach attempt to obtain mRS. No answer. Left message for returned call.  Shereen [facility]  Population Health Care Management Assistant  Direct Dial: [phone]  Fax: [phone] Website: [email or web address]

## 2023-10-23 ENCOUNTER — Emergency Department
Admission: EM | Admit: 2023-10-23 | Discharge: 2023-10-23 | Disposition: A | Discharge status: Home / Self Care | Attending: Emergency Medicine | Admitting: Emergency Medicine

## 2023-10-23 ENCOUNTER — Other Ambulatory Visit: Payer: Self-pay

## 2023-10-23 ENCOUNTER — Emergency Department

## 2023-10-23 DIAGNOSIS — Z8673 Personal history of transient ischemic attack (TIA), and cerebral infarction without residual deficits: Secondary | ICD-10-CM | POA: Insufficient documentation

## 2023-10-23 DIAGNOSIS — M79605 Pain in left leg: Secondary | ICD-10-CM | POA: Diagnosis not present

## 2023-10-23 DIAGNOSIS — M79662 Pain in left lower leg: Secondary | ICD-10-CM | POA: Diagnosis not present

## 2023-10-23 LAB — URINALYSIS, ROUTINE W REFLEX MICROSCOPIC
Bacteria, UA: NONE SEEN
Bilirubin Urine: NEGATIVE
Glucose, UA: >=500 mg/dL — AB
Ketones, ur: NEGATIVE mg/dL
Leukocytes,Ua: NEGATIVE
Nitrite: NEGATIVE
Protein, ur: NEGATIVE mg/dL
RBC / HPF: >50 RBC/hpf (ref 0–5)
Specific Gravity, Urine: 1.011 (ref 1.005–1.030)
pH: 5 (ref 5.0–8.0)

## 2023-10-23 LAB — CBC
HCT: 40.6 % (ref 39.0–52.0)
Hemoglobin: 13 g/dL (ref 13.0–17.0)
MCH: 30.4 pg (ref 26.0–34.0)
MCHC: 32 g/dL (ref 30.0–36.0)
MCV: 95.1 fL (ref 80.0–100.0)
Platelets: 204 K/uL (ref 150–400)
RBC: 4.27 MIL/uL (ref 4.22–5.81)
RDW: 16.6 % — ABNORMAL HIGH (ref 11.5–15.5)
WBC: 4.2 K/uL (ref 4.0–10.5)
nRBC: 0 % (ref 0.0–0.2)

## 2023-10-23 LAB — COMPREHENSIVE METABOLIC PANEL WITH GFR
ALT: 8 U/L (ref 0–44)
AST: 14 U/L — ABNORMAL LOW (ref 15–41)
Albumin: 2.9 g/dL — ABNORMAL LOW (ref 3.5–5.0)
Alkaline Phosphatase: 93 U/L (ref 38–126)
Anion gap: 10 (ref 5–15)
BUN: 31 mg/dL — ABNORMAL HIGH (ref 8–23)
CO2: 22 mmol/L (ref 22–32)
Calcium: 9 mg/dL (ref 8.9–10.3)
Chloride: 107 mmol/L (ref 98–111)
Creatinine, Ser: 2.61 mg/dL — ABNORMAL HIGH (ref 0.61–1.24)
GFR, Estimated: 23 mL/min — ABNORMAL LOW (ref >60)
Glucose, Bld: 58 mg/dL — ABNORMAL LOW (ref 70–99)
Potassium: 3.6 mmol/L (ref 3.5–5.1)
Sodium: 139 mmol/L (ref 135–145)
Total Bilirubin: 0.5 mg/dL (ref 0.0–1.2)
Total Protein: 6.5 g/dL (ref 6.5–8.1)

## 2023-10-23 NOTE — ED Notes (Signed)
 Brother in law came to pick up patient. Pt was placed in gown for discharge since clothes were soiled. Pt safely transferred to wheelchair and then to care.

## 2023-10-23 NOTE — ED Notes (Signed)
 Spoke with POA who stated Pt is more confused than normal. Pt did have recent stroke and has declining mental function but is concerned for UTI. Informed MD of concerns at this time

## 2023-10-23 NOTE — ED Notes (Signed)
 Pt given graham crackers and apple sauce as snack.

## 2023-10-23 NOTE — Discharge Instructions (Addendum)
 Patient's US  was negative for DVT in the left leg

## 2023-10-23 NOTE — ED Notes (Signed)
 Full bed linen change and clean briefs put on pt due to urine incontinence. Pt's soiled clothes and shoes put in a Patient Belonging's Bag and left at bedside. Falls bracelet, non slip socks, and bed alarm on.

## 2023-10-23 NOTE — ED Notes (Signed)
 Spoke with POA for discharge and reviewed discharge instructions. POA stated they can not pick up patient until 5pm.

## 2023-10-23 NOTE — ED Triage Notes (Signed)
 Pt to ED [facility] from home for left calf pain x2 weeks. Neg xray . Sent for DVT rule out.  Pt has difficulty communicated verbally d/t stroke deficits.  Recently stopped blood thinner d/t rectal bleeding  POA tiffany faucette [phone]

## 2023-10-23 NOTE — ED Provider Notes (Signed)
   [facility] Provider Note    Event Date/Time   First MD Initiated Contact with Patient 10/23/23 1105     (approximate)   History   Left leg cramping   HPI  [name] is a 89 y.o. male with a history of atrial fibrillation DVT stroke no longer on blood thinners because of GI bleed who presents with complaints of 2 weeks of intermittent left leg cramping reportedly.     Physical Exam   Triage Vital Signs: ED Triage Vitals  Encounter Vitals Group     BP 10/23/23 1039 99/62     Girls Systolic BP Percentile --      Girls Diastolic BP Percentile --      Boys Systolic BP Percentile --      Boys Diastolic BP Percentile --      Pulse Rate 10/23/23 1039 77     Resp 10/23/23 1039 18     Temp 10/23/23 1039 97.7 F (36.5 C)     Temp src --      SpO2 10/23/23 1039 98 %     Weight 10/23/23 1040 90.7 kg (200 lb)     Height 10/23/23 1039 1.854 m (6' 1)     Head Circumference --      Peak Flow --      Pain Score --      Pain Loc --      Pain Education --      Exclude from Growth Chart --     Most recent vital signs: Vitals:   10/23/23 1039  BP: 99/62  Pulse: 77  Resp: 18  Temp: 97.7 F (36.5 C)  SpO2: 98%     General: Awake, no distress.  CV:  Good peripheral perfusion.  Resp:  Normal effort.  Abd:  No distention.  Other:  Left leg: No redness to suggest infection, warm and well-perfused, 1+ distal pulses.  No significant swelling   ED Results / Procedures / Treatments   Labs (all labs ordered are listed, but only abnormal results are displayed) Labs Reviewed - No data to display   EKG     RADIOLOGY Ultrasound negative for DVT    PROCEDURES:  Critical Care performed:   Procedures   MEDICATIONS ORDERED IN ED: Medications - No data to display   IMPRESSION / MDM / ASSESSMENT AND PLAN / ED COURSE  I reviewed the triage vital signs and the nursing notes. Patient's presentation is most consistent with acute  complicated illness / injury requiring diagnostic workup.  Patient presents with left leg cramping as detailed above, differential includes DVT, musculoskeletal cramping, leg claudication  Overall extremity is warm and well-perfused, reassuring pulses, no signs of infection or cellulitis.  Ultrasound is negative for DVT.  Appropriate for discharge with outpatient follow-up for further evaluation        FINAL CLINICAL IMPRESSION(S) / ED DIAGNOSES   Final diagnoses:  Left leg pain     Rx / DC Orders   ED Discharge Orders     None        Note:  This document was prepared using Dragon voice recognition software and may include unintentional dictation errors.   Arlander Lamar, MD 10/23/23 1239

## 2023-10-24 ENCOUNTER — Telehealth: Payer: Self-pay

## 2023-10-24 NOTE — Patient Outreach (Signed)
 3 outreach attempts were completed to obtain mRs. mRs could not be obtained because patient never returned my calls. mRs=7    Shereen Gin [facility] Management Assistant  Direct Dial: [phone]  Fax: [phone] Website: [email or web address]

## 2023-10-28 ENCOUNTER — Telehealth: Payer: Self-pay | Admitting: Nurse Practitioner

## 2023-10-28 NOTE — Telephone Encounter (Addendum)
 Received 09/24/23, 10/16/23, 10/23/23 & 10/24/23 therapy orders from [facility]. Gave to Alyssa for signatures-[name]

## 2023-10-31 ENCOUNTER — Ambulatory Visit: Admitting: Nurse Practitioner

## 2023-10-31 ENCOUNTER — Encounter: Payer: Self-pay | Admitting: Nurse Practitioner

## 2023-10-31 VITALS — BP 110/68 | HR 90 | Temp 96.0°F | Resp 16 | Ht 73.0 in | Wt 209.0 lb

## 2023-10-31 DIAGNOSIS — E1169 Type 2 diabetes mellitus with other specified complication: Secondary | ICD-10-CM

## 2023-10-31 DIAGNOSIS — I480 Paroxysmal atrial fibrillation: Secondary | ICD-10-CM

## 2023-10-31 DIAGNOSIS — I252 Old myocardial infarction: Secondary | ICD-10-CM | POA: Diagnosis not present

## 2023-10-31 DIAGNOSIS — M79605 Pain in left leg: Secondary | ICD-10-CM

## 2023-10-31 DIAGNOSIS — E1159 Type 2 diabetes mellitus with other circulatory complications: Secondary | ICD-10-CM

## 2023-10-31 DIAGNOSIS — Z950 Presence of cardiac pacemaker: Secondary | ICD-10-CM

## 2023-10-31 DIAGNOSIS — I6930 Unspecified sequelae of cerebral infarction: Secondary | ICD-10-CM

## 2023-10-31 DIAGNOSIS — E1122 Type 2 diabetes mellitus with diabetic chronic kidney disease: Secondary | ICD-10-CM

## 2023-10-31 DIAGNOSIS — R4701 Aphasia: Secondary | ICD-10-CM

## 2023-10-31 DIAGNOSIS — R5381 Other malaise: Secondary | ICD-10-CM

## 2023-10-31 DIAGNOSIS — Z7189 Other specified counseling: Secondary | ICD-10-CM

## 2023-10-31 DIAGNOSIS — J449 Chronic obstructive pulmonary disease, unspecified: Secondary | ICD-10-CM

## 2023-10-31 DIAGNOSIS — I739 Peripheral vascular disease, unspecified: Secondary | ICD-10-CM

## 2023-10-31 DIAGNOSIS — N184 Chronic kidney disease, stage 4 (severe): Secondary | ICD-10-CM | POA: Diagnosis not present

## 2023-10-31 DIAGNOSIS — C61 Malignant neoplasm of prostate: Secondary | ICD-10-CM

## 2023-10-31 LAB — POCT GLYCOSYLATED HEMOGLOBIN (HGB A1C): Hemoglobin A1C: 6.6 % — AB (ref 4.0–5.6)

## 2023-10-31 NOTE — Progress Notes (Cosign Needed Addendum)
 [facility] [address] [place], [place] [address]  Internal MEDICINE  Office Visit Note  Patient Name: [name]  [id number]  [id number]  Date of Service: 10/31/2023  Chief Complaint  Patient presents with   Follow-up    Left leg pain- still there Diarrhea    HPI [name] presents for a follow-up visit for declining functional status, self-care deficit, diabetes, hypertension, history of stroke and MI.  Acute left leg pain and intermittent claudication -- patient has constant pain in his left leg whether he is sitting and resting or standing or walking. He went to the ED recently and is following up today. They ruled out a DVT. The pain seems to be most consistent with claudication related to PAD or PVD. He does not see a vascular surgeon yet.  Diabetes -- A1c is improved to 6.6 today from 6.9 previously. Currently only taking farxiga . Tried ozempic  x1 dose but diarrhea became worse Hypertension -- blood pressure is stable.  History of  recent stroke in June this year-- with global aphasia. He is able to swallow fine but his speech is still very garbled. He is able to answer questions but has difficulty saying the correct words clearly. When asked where he is hurting on his left leg, he is able to point to the area that hurts but was unable to make a complete sentence to talk about that pain. He is still seeing speech therapy.  History of STEMI which occurred the day before his stroke in June this year.  With the history of stroke and MI, It is possible that he has PAD or PVD which is causing the pain in his left leg. A DVT was ruled out in the ER.  Diarrhea -- recently worse since restarting ozempic  COPD -- uses spiriva  daily Prostate cancer -- followed by oncology, currently still taking Xtandi .  AFIB -- He is currently on amiodarone , verapamil  and edoxaban  High cholesterol -- taking atorvastatin  daily.  Self care deficit regarding general daily activities,  hygiene, medication management. His wife has dementia and he cannot rely on her to manage his medications and care. The other family members work full time and cannot be in the home 24/7.  PROGNOSIS AND ADVANCE CARE PLANNING: This visit was voluntary. The patient, his wife, his son and daughter-in-law are present along with the provider. Patient is no longer able to work. He was just recently approved for long term disability with his job. He was working full time prior to his MI and stroke with both occurred in June this year. It is not expected that he will return to a level of functioning that will allow him to return to work. He does not qualify for medicare so he cannot receive home health aide services via that avenue. We will try to get some amount of home health aide hours via medicare. If that is not possible, the family members were instructed that this may be a service that will need to be paid for out of pocket. As before, The son and the patient's daughter-in-law were instructed to continue to look into local nursing homes for possible options to to the level of care and supervision he will need and that his wife will ultimately need as well since she has dementia. Code status was not discussed today but will be discussed at his next office visit in December. Advance directives were briefly discussed but not decided upon, will discuss this further at his next office visit in December.  Current Care team: Oncology: Dr. Zelphia Cap Neurology: Dr. Eather Popp Cardiology: Dr. Ole Holts Gastroenterology: Dr. Ladell Boss Nephrology: Dr. Saralee Stank Podiatry: Dr. Delon Merlin Eye doctor: [facility] in [place], [place]  Current Medication: Outpatient Encounter Medications as of 10/31/2023  Medication Sig Note   HYDROcodone -Acetaminophen  2.5-325 MG TABS Take 0.5-1 tablets by mouth every 6 (six) hours as needed for up to 5 days.    acetaminophen  (TYLENOL ) 325 MG tablet Take 2  tablets (650 mg total) by mouth every 6 (six) hours as needed for mild pain (pain score 1-3) or fever (or Fever >/= 101).    albuterol  (VENTOLIN  HFA) 108 (90 Base) MCG/ACT inhaler Inhale 2 puffs into the lungs every 4 (four) hours as needed for wheezing or shortness of breath.    amiodarone  (PACERONE ) 200 MG tablet Take 1 tablet (200 mg total) by mouth daily.    atorvastatin  (LIPITOR) 10 MG tablet Take 1 tablet (10 mg total) by mouth daily.    Cholecalciferol (VITAMIN D-3 PO) Take 1 capsule by mouth daily.    dapagliflozin  propanediol (FARXIGA ) 5 MG TABS tablet Take 1 tablet (5 mg total) by mouth daily.    edoxaban  (SAVAYSA ) 30 MG TABS tablet Take 30 mg by mouth daily.    enzalutamide  (XTANDI ) 40 MG tablet Take 4 tablets (160 mg total) by mouth daily.    feeding supplement, GLUCERNA SHAKE, (GLUCERNA SHAKE) LIQD Take 237 mLs by mouth 3 (three) times daily between meals.    ferrous sulfate  (FEROSUL) 325 (65 FE) MG tablet Take 1 tablet by mouth daily.    loperamide  (IMODIUM ) 2 MG capsule Take 1 capsule (2 mg total) by mouth every 8 (eight) hours as needed for diarrhea or loose stools.    nitroGLYCERIN  (NITROSTAT ) 0.4 MG SL tablet Place 1 tablet (0.4 mg total) under the tongue every 5 (five) minutes as needed for chest pain.    omeprazole (PRILOSEC) 20 MG capsule Take 20 mg by mouth daily.    Semaglutide ,0.25 or 0.5MG /DOS, (OZEMPIC , 0.25 OR 0.5 MG/DOSE,) 2 MG/3ML SOPN Inject 0.5 mg into the skin once a week. 06/29/2023: Monday's   Tiotropium Bromide  Monohydrate (SPIRIVA  RESPIMAT) 2.5 MCG/ACT AERS Inhale 2 puffs into the lungs daily.    verapamil  (CALAN -SR) 120 MG CR tablet Take 1 tablet (120 mg total) by mouth at bedtime.    vitamin B-12 (CYANOCOBALAMIN ) 1000 MCG tablet Take 1 tablet (1,000 mcg total) by mouth daily.    No facility-administered encounter medications on file as of 10/31/2023.    Surgical History: Past Surgical History:  Procedure Laterality Date   CATARACT EXTRACTION      COLONOSCOPY WITH PROPOFOL  N/A 07/02/2019   Procedure: COLONOSCOPY WITH PROPOFOL ;  Surgeon: Unk Corinn Skiff, MD;  Location: [facility] ENDOSCOPY;  Service: Gastroenterology;  Laterality: N/A;   ESOPHAGOGASTRODUODENOSCOPY (EGD) WITH PROPOFOL  N/A 03/19/2022   Procedure: ESOPHAGOGASTRODUODENOSCOPY (EGD) WITH PROPOFOL ;  Surgeon: Therisa Bi, MD;  Location: [facility] ENDOSCOPY;  Service: Gastroenterology;  Laterality: N/A;   FLEXIBLE SIGMOIDOSCOPY N/A 09/23/2023   Procedure: SIGMOIDOSCOPY, FLEXIBLE;  Surgeon: Toledo, Ladell POUR, MD;  Location: [facility] ENDOSCOPY;  Service: Gastroenterology;  Laterality: N/A;   IR CT HEAD LTD  08/27/2019   IR CT HEAD LTD  06/28/2023   IR PERCUTANEOUS ART THROMBECTOMY/INFUSION INTRACRANIAL INC DIAG ANGIO  08/27/2019       IR PERCUTANEOUS ART THROMBECTOMY/INFUSION INTRACRANIAL INC DIAG ANGIO  08/27/2019   IR PERCUTANEOUS ART THROMBECTOMY/INFUSION INTRACRANIAL INC DIAG ANGIO  06/28/2023   LEFT HEART CATH AND CORONARY ANGIOGRAPHY N/A 06/24/2023  Procedure: LEFT HEART CATH AND CORONARY ANGIOGRAPHY;  Surgeon: Ammon Blunt, MD;  Location: [facility] INVASIVE CV LAB;  Service: Cardiovascular;  Laterality: N/A;   LOWER EXTREMITY ANGIOGRAPHY Left 12/25/2020   Procedure: Lower Extremity Angiography;  Surgeon: Marea Selinda RAMAN, MD;  Location: [facility] INVASIVE CV LAB;  Service: Cardiovascular;  Laterality: Left;   PACEMAKER LEADLESS INSERTION N/A 12/19/2020   Procedure: PACEMAKER LEADLESS INSERTION;  Surgeon: Ammon Blunt, MD;  Location: [facility] INVASIVE CV LAB;  Service: Cardiovascular;  Laterality: N/A;   PROSTATE CANCER     PROSTATE SURGERY     RADIOLOGY WITH ANESTHESIA N/A 08/27/2019   Procedure: IR WITH ANESTHESIA;  Surgeon: Dolphus Carrion, MD;  Location: [facility] OR;  Service: Radiology;  Laterality: N/A;   RADIOLOGY WITH ANESTHESIA N/A 06/28/2023   Procedure: RADIOLOGY WITH ANESTHESIA;  Surgeon: Radiologist, Medication, MD;  Location: [facility] OR;  Service: Radiology;  Laterality: N/A;    Medical  History: Past Medical History:  Diagnosis Date   Acute deep vein thrombosis (DVT) of femoral vein of left lower extremity (HCC)    Atrial fibrillation (HCC)    CHF (congestive heart failure) (HCC)    Chronic kidney disease    Diabetes (HCC)    Hearing loss    Hyperlipidemia    Liver cyst    Nephrolithiasis    OSA (obstructive sleep apnea)    Prostate cancer (HCC)    SSS (sick sinus syndrome) (HCC)    Stroke (HCC)    Valvular heart disease     Family History: Family History  Problem Relation Age of Onset   Colon cancer Mother    Diabetes Other    High blood pressure Other    Prostate cancer Brother    Diabetes Brother     Social History   Socioeconomic History   Marital status: Married    Spouse name: Dolphus Linch   Number of children: 2   Years of education: 12   Highest education level: 12th grade  Occupational History   Occupation: [employer]  Tobacco Use   Smoking status: Former    Types: Cigarettes   Smokeless tobacco: Never  Vaping Use   Vaping status: Never Used  Substance and Sexual Activity   Alcohol use: No   Drug use: No   Sexual activity: Not Currently  Other Topics Concern   Not on file  Social History Narrative   Not on file   Social Drivers of Health   Financial Resource Strain: Low Risk  (07/28/2023)   Overall Financial Resource Strain (CARDIA)    Difficulty of Paying Living Expenses: Not very hard  Food Insecurity: No Food Insecurity (09/18/2023)   Hunger Vital Sign    Worried About Running Out of Food in the Last Year: Never true    Ran Out of Food in the Last Year: Never true  Transportation Needs: No Transportation Needs (09/18/2023)   PRAPARE - Administrator, Civil Service (Medical): No    Lack of Transportation (Non-Medical): No  Physical Activity: Inactive (10/29/2019)   Exercise Vital Sign    Days of Exercise per Week: 0 days    Minutes of Exercise per Session: 0 min  Stress: No Stress Concern Present (10/29/2019)    [employer] of Occupational Health - Occupational Stress Questionnaire    Feeling of Stress : Only a little  Recent Concern: Stress - Stress Concern Present (09/08/2019)   [employer] of Occupational Health - Occupational Stress Questionnaire    Feeling of Stress : To some  extent  Social Connections: Patient Unable To Answer (09/18/2023)   Social Connection and Isolation Panel    Frequency of Communication with Friends and Family: Patient unable to answer    Frequency of Social Gatherings with Friends and Family: Patient unable to answer    Attends Religious Services: Patient unable to answer    Active Member of Clubs or Organizations: Patient unable to answer    Attends Banker Meetings: Patient unable to answer    Marital Status: Patient unable to answer  Intimate Partner Violence: Patient Unable To Answer (09/18/2023)   Humiliation, Afraid, Rape, and Kick questionnaire    Fear of Current or Ex-Partner: Patient unable to answer    Emotionally Abused: Patient unable to answer    Physically Abused: Patient unable to answer    Sexually Abused: Patient unable to answer      Review of Systems  Constitutional:  Positive for activity change, appetite change, fatigue and unexpected weight change.  Respiratory: Negative.  Negative for cough, chest tightness, shortness of breath and wheezing.   Cardiovascular:  Positive for leg swelling. Negative for chest pain and palpitations.  Gastrointestinal:  Positive for diarrhea.  Musculoskeletal:  Positive for arthralgias, gait problem and myalgias.  Neurological:  Positive for weakness.  Psychiatric/Behavioral:  Positive for confusion and sleep disturbance. The patient is nervous/anxious.     Vital Signs: BP 110/68   Pulse 90   Temp (!) 96 F (35.6 C)   Resp 16   Ht 6' 1 (1.854 m)   Wt 209 lb (94.8 kg)   SpO2 98%   BMI 27.57 kg/m    Physical Exam Vitals reviewed.  Constitutional:      General: He is not in  acute distress.    Appearance: Normal appearance. He is ill-appearing.  HENT:     Head: Normocephalic and atraumatic.  Eyes:     Pupils: Pupils are equal, round, and reactive to light.  Cardiovascular:     Rate and Rhythm: Normal rate and regular rhythm.  Pulmonary:     Effort: Pulmonary effort is normal. No respiratory distress.  Skin:    General: Skin is warm and dry.     Capillary Refill: Capillary refill takes less than 2 seconds.  Neurological:     Mental Status: He is alert. He is disoriented.     Motor: Weakness present.  Psychiatric:        Mood and Affect: Mood normal.        Assessment/Plan: 1. Acute leg pain, left (Primary) Referred urgently to vascular surgery. Referred to palliative care Low dose of hydrocodone  prescribed to help with left leg pain while he is waiting to see the vascular surgeon.  - Ambulatory referral to Vascular Surgery - Amb Referral to Palliative Care - HYDROcodone -Acetaminophen  2.5-325 MG TABS; Take 0.5-1 tablets by mouth every 6 (six) hours as needed for up to 5 days.  Dispense: 20 tablet; Refill: 0  2. Intermittent claudication Referred urgently to vascular surgery. Referred to palliative care - Ambulatory referral to Vascular Surgery - Ambulatory referral to Home Health - Amb Referral to Palliative Care  3. History of stroke with residual effects Referred to home health for nursing, PT and home health aide, and referred to palliative care for advance care planning and care coordination  - Ambulatory referral to Home Health - Amb Referral to Palliative Care  4. History of non-ST elevation myocardial infarction (NSTEMI) Followed by cardiology. Referred to home health for nursing, PT and home health  aide, and referred to palliative care for advance care planning and care coordination  - Ambulatory referral to Home Health - Amb Referral to Palliative Care  5. Paroxysmal atrial fibrillation (HCC) Followed by cardiology. Referred to  home health for nursing, PT and home health aide, and referred to palliative care for advance care planning and care coordination  - Ambulatory referral to Home Health - Amb Referral to Palliative Care  6. CKD (chronic kidney disease) stage 4, GFR 15-29 ml/min (HCC) Followed by nephrology. Referred to home health for nursing, PT and home health aide, and referred to palliative care for advance care planning and care coordination  - Ambulatory referral to Home Health - Amb Referral to Palliative Care  7. Type 2 diabetes mellitus with stage 4 chronic kidney disease, without long-term current use of insulin  (HCC) Referred to home health for nursing, PT and home health aide, and referred to palliative care for advance care planning and care coordination  A1c is stable at 6.6 for now. Hold ozempic  for now due to increased side effect of diarrhea. Continue farxiga  at decreased dose of 5mg  as prescribed. Will consider an alternative to ozempic  to add if glucose levels are high.  - POCT glycosylated hemoglobin (Hb A1C) - Ambulatory referral to Home Health - Amb Referral to Palliative Care - dapagliflozin  propanediol (FARXIGA ) 5 MG TABS tablet; Take 1 tablet (5 mg total) by mouth daily.  Dispense: 30 tablet; Refill: 5  8. Chronic obstructive pulmonary disease, unspecified COPD type (HCC) Continue inhalers as prescribed. Referred to home health for nursing, PT and home health aide, and referred to palliative care for advance care planning and care coordination  - Ambulatory referral to Home Health - Amb Referral to Palliative Care  9. Prostate cancer [facility]) Continue follow up with oncology. Continue Xtandi  as prescribed. Referred to home health for nursing, PT and home health aide, and referred to palliative care for advance care planning and care coordination   10. Declining functional status Referred to home health for nursing, PT and home health aide, and referred to palliative care for advance  care planning and care coordination  - Ambulatory referral to Home Health - Amb Referral to Palliative Care  11. Global aphasia Referred to home health for nursing, PT and home health aide, and referred to palliative care for advance care planning and care coordination  Continue working with speech therapy - Ambulatory referral to Home Health - Amb Referral to Palliative Care  12. Pacemaker Noted, followed by cardiology. Referred to home health for nursing, PT and home health aide, and referred to palliative care for advance care planning and care coordination  - Amb Referral to Palliative Care  14. Encounter for advance care planning Referred to palliative care Referred to home health for nursing, PT and home health aide.  Discussed other options for increased level of care and supervision required for the patient and his wife. His son and daughter-in-law still work full time and cannot afford to take much time off. Code status was not discussed today but will plan to discuss code status at his next office visit in December. Advance directives were briefly discussed but not decided upon, will discuss this further at his next office visit in December.     General Counseling: abhinav mayorquin understanding of the findings of todays visit and agrees with plan of treatment. I have discussed any further diagnostic evaluation that may be needed or ordered today. We also reviewed his medications today. he has been  encouraged to call the office with any questions or concerns that should arise related to todays visit.    Orders Placed This Encounter  Procedures   Ambulatory referral to Vascular Surgery   Ambulatory referral to Home Health   Amb Referral to Palliative Care   POCT glycosylated hemoglobin (Hb A1C)    Meds ordered this encounter  Medications   dapagliflozin  propanediol (FARXIGA ) 5 MG TABS tablet    Sig: Take 1 tablet (5 mg total) by mouth daily.    Dispense:  30 tablet     Refill:  5    Fill new script today. Note decreased dose.   HYDROcodone -Acetaminophen  2.5-325 MG TABS    Sig: Take 0.5-1 tablets by mouth every 6 (six) hours as needed for up to 5 days.    Dispense:  20 tablet    Refill:  0    Fill new script today asap    Return for previously scheduled, AWV, Jessamy Torosyan PCP in december.   Total time spent:30 Minutes Time spent includes review of chart, medications, test results, and follow up plan with the patient.   [place] Controlled Substance Database was reviewed by me.  This patient was seen by Mardy Maxin, FNP-C in collaboration with Dr. Sigrid Bathe as a part of collaborative care agreement.   Zakariah Urwin R. Maxin, MSN, FNP-C Internal medicine

## 2023-11-01 ENCOUNTER — Encounter: Payer: Self-pay | Admitting: Nurse Practitioner

## 2023-11-01 MED ORDER — HYDROCODONE-ACETAMINOPHEN 2.5-325 MG PO TABS: 0.5000 | ORAL_TABLET | Freq: Four times a day (QID) | ORAL | 0 refills | Status: DC | PRN

## 2023-11-01 MED ORDER — DAPAGLIFLOZIN PROPANEDIOL 5 MG PO TABS: 5.0000 mg | ORAL_TABLET | Freq: Every day | ORAL | 5 refills | Status: AC

## 2023-11-01 NOTE — Addendum Note (Signed)
 Addended by: Declyn Offield on: 11/01/2023 09:57 AM   Modules accepted: Orders

## 2023-11-01 NOTE — Patient Instructions (Signed)
 Hold ozempic  for now  Continue farxiga   Consider adding an alternative diabetic medication if glucose levels are high.   Referred for home health nursing, PT and home health aide  Referred to vascular surgery for left leg pain  Referred to palliative care for advance care planning and care coordination.   Family members have been instructed to look into local nursing homes to see if this is a possible option due to the increased assistance and supervision that the patient needs.

## 2023-11-03 ENCOUNTER — Telehealth: Payer: Self-pay | Admitting: Oncology

## 2023-11-03 ENCOUNTER — Telehealth: Payer: Self-pay | Admitting: Nurse Practitioner

## 2023-11-03 ENCOUNTER — Telehealth: Payer: Self-pay

## 2023-11-03 NOTE — Telephone Encounter (Signed)
 Sent message to [facility] for home health

## 2023-11-03 NOTE — Telephone Encounter (Signed)
 pt family member stated lots going on and need to cancel for now, will call back to r/s when they figure out transportation

## 2023-11-03 NOTE — Telephone Encounter (Signed)
 10/12/23, 10/16/23, 10/23/23 & 10/24/23 therapy orders signed & faxed back to [facility]; [phone]. Scanned-[name]

## 2023-11-04 ENCOUNTER — Other Ambulatory Visit: Payer: Self-pay | Admitting: Nurse Practitioner

## 2023-11-04 MED ORDER — HYDROCODONE-ACETAMINOPHEN 5-325 MG PO TABS: 0.5000 | ORAL_TABLET | Freq: Four times a day (QID) | ORAL | 0 refills | Status: AC | PRN

## 2023-11-04 NOTE — Progress Notes (Signed)
 Pt  daughter in law notified that we sent med

## 2023-11-05 ENCOUNTER — Inpatient Hospital Stay

## 2023-11-05 ENCOUNTER — Other Ambulatory Visit (INDEPENDENT_AMBULATORY_CARE_PROVIDER_SITE_OTHER): Payer: Self-pay | Admitting: Vascular Surgery

## 2023-11-05 ENCOUNTER — Other Ambulatory Visit (INDEPENDENT_AMBULATORY_CARE_PROVIDER_SITE_OTHER)

## 2023-11-05 ENCOUNTER — Inpatient Hospital Stay: Admitting: Oncology

## 2023-11-05 DIAGNOSIS — M7989 Other specified soft tissue disorders: Secondary | ICD-10-CM

## 2023-11-06 ENCOUNTER — Ambulatory Visit: Admitting: Neurology

## 2023-11-06 ENCOUNTER — Ambulatory Visit: Attending: Nurse Practitioner

## 2023-11-06 ENCOUNTER — Encounter

## 2023-11-06 LAB — VAS US ABI WITH/WO TBI
Left ABI: 1.28
Right ABI: 1.23

## 2023-11-07 ENCOUNTER — Ambulatory Visit (INDEPENDENT_AMBULATORY_CARE_PROVIDER_SITE_OTHER): Admitting: Vascular Surgery

## 2023-11-07 ENCOUNTER — Telehealth: Payer: Self-pay

## 2023-11-07 NOTE — Telephone Encounter (Signed)
 [facility] nurse PJ [phone] called that they need verbal order for hospice as per dr fernand gave verbal order for Hospice and ok to hospice for attending physician

## 2023-11-13 ENCOUNTER — Other Ambulatory Visit (HOSPITAL_COMMUNITY): Payer: Self-pay

## 2023-11-14 ENCOUNTER — Ambulatory Visit: Attending: Nurse Practitioner

## 2023-11-17 ENCOUNTER — Other Ambulatory Visit: Payer: Self-pay

## 2023-11-17 NOTE — Progress Notes (Signed)
 Clinical Intervention Note  Clinical Intervention Notes: Patient reported starting hydrocodone  and gabapentin. Per chart on 10/10, patient has been dealing with intermittent leg pain. Provider submitted referral to vascular surgery and hospice. Per [employer], Xtandi  may reduce hydrocodone  exposure or cause an onset of withdrawal symptoms in patients with physical dependance. This is a first time fill and only to be used as needed, so risk of withdrawal symptoms is minimum.   Clinical Intervention Outcomes: Prevention of an adverse drug event   Advertising Account Planner

## 2023-11-17 NOTE — Progress Notes (Signed)
 Specialty Pharmacy Refill Coordination Note  [name] is a 89 y.o. male contacted today regarding refills of specialty medication(s) Enzalutamide  (XTANDI )   Patient requested Delivery   Delivery date: 11/24/23   Verified address: [address] [place] [facility] [address]   Medication will be filled on: 11/21/23

## 2023-11-18 ENCOUNTER — Encounter: Payer: Self-pay | Admitting: Nurse Practitioner

## 2023-11-18 DIAGNOSIS — I6930 Unspecified sequelae of cerebral infarction: Secondary | ICD-10-CM | POA: Insufficient documentation

## 2023-11-18 DIAGNOSIS — I252 Old myocardial infarction: Secondary | ICD-10-CM | POA: Insufficient documentation

## 2023-11-19 ENCOUNTER — Other Ambulatory Visit

## 2023-11-19 ENCOUNTER — Ambulatory Visit: Admitting: Oncology

## 2023-11-20 DIAGNOSIS — G4733 Obstructive sleep apnea (adult) (pediatric): Secondary | ICD-10-CM | POA: Diagnosis not present

## 2023-11-21 ENCOUNTER — Other Ambulatory Visit: Payer: Self-pay

## 2023-11-21 ENCOUNTER — Encounter: Payer: Self-pay | Admitting: Oncology

## 2023-11-21 NOTE — Progress Notes (Signed)
 Specialty Pharmacy Ongoing Clinical Assessment Note  [name] is a 89 y.o. male who is being followed by the specialty pharmacy service for RxSp Oncology   Patient's specialty medication(s) reviewed today: Enzalutamide  (XTANDI )   Missed doses in the last 4 weeks: 0   Patient/Caregiver did not have any additional questions or concerns.   Therapeutic benefit summary: Patient is achieving benefit   Adverse events/side effects summary: No adverse events/side effects   Patient's therapy is appropriate to: Continue    Goals Addressed             This Visit's Progress    Slow Disease Progression   On track    Patient is on track. Patient will maintain adherence.  [name]'s PSA has increased slightly to 0.83 as of July. Patient has not been back at office since for updated labs.         Follow up: 6 months  [facility]

## 2023-11-24 ENCOUNTER — Ambulatory Visit: Admitting: Podiatry

## 2023-11-24 DIAGNOSIS — Z91199 Patient's noncompliance with other medical treatment and regimen due to unspecified reason: Secondary | ICD-10-CM

## 2023-11-24 NOTE — Progress Notes (Signed)
 1. No-show for appointment   No show #1.

## 2023-12-10 ENCOUNTER — Other Ambulatory Visit: Payer: Self-pay

## 2023-12-10 ENCOUNTER — Emergency Department
Admission: EM | Admit: 2023-12-10 | Discharge: 2023-12-10 | Discharge status: Against Medical Advice | Attending: Emergency Medicine | Admitting: Emergency Medicine

## 2023-12-10 ENCOUNTER — Other Ambulatory Visit (HOSPITAL_COMMUNITY): Payer: Self-pay

## 2023-12-10 ENCOUNTER — Other Ambulatory Visit: Payer: Self-pay | Admitting: Oncology

## 2023-12-10 DIAGNOSIS — C61 Malignant neoplasm of prostate: Secondary | ICD-10-CM | POA: Diagnosis not present

## 2023-12-10 DIAGNOSIS — Z5321 Procedure and treatment not carried out due to patient leaving prior to being seen by health care provider: Secondary | ICD-10-CM | POA: Diagnosis not present

## 2023-12-10 DIAGNOSIS — K625 Hemorrhage of anus and rectum: Secondary | ICD-10-CM | POA: Insufficient documentation

## 2023-12-10 DIAGNOSIS — Z8673 Personal history of transient ischemic attack (TIA), and cerebral infarction without residual deficits: Secondary | ICD-10-CM | POA: Insufficient documentation

## 2023-12-10 LAB — CBC
HCT: 41.8 % (ref 39.0–52.0)
Hemoglobin: 13.1 g/dL (ref 13.0–17.0)
MCH: 30.4 pg (ref 26.0–34.0)
MCHC: 31.3 g/dL (ref 30.0–36.0)
MCV: 97 fL (ref 80.0–100.0)
Platelets: 311 K/uL (ref 150–400)
RBC: 4.31 MIL/uL (ref 4.22–5.81)
RDW: 16 % — ABNORMAL HIGH (ref 11.5–15.5)
WBC: 6.1 K/uL (ref 4.0–10.5)
nRBC: 0 % (ref 0.0–0.2)

## 2023-12-10 LAB — COMPREHENSIVE METABOLIC PANEL WITH GFR
ALT: 9 U/L (ref 0–44)
AST: 16 U/L (ref 15–41)
Albumin: 3.8 g/dL (ref 3.5–5.0)
Alkaline Phosphatase: 137 U/L — ABNORMAL HIGH (ref 38–126)
Anion gap: 14 (ref 5–15)
BUN: 40 mg/dL — ABNORMAL HIGH (ref 8–23)
CO2: 23 mmol/L (ref 22–32)
Calcium: 9.5 mg/dL (ref 8.9–10.3)
Chloride: 103 mmol/L (ref 98–111)
Creatinine, Ser: 2.21 mg/dL — ABNORMAL HIGH (ref 0.61–1.24)
GFR, Estimated: 28 mL/min — ABNORMAL LOW (ref >60)
Glucose, Bld: 122 mg/dL — ABNORMAL HIGH (ref 70–99)
Potassium: 4.4 mmol/L (ref 3.5–5.1)
Sodium: 140 mmol/L (ref 135–145)
Total Bilirubin: 0.3 mg/dL (ref 0.0–1.2)
Total Protein: 7.6 g/dL (ref 6.5–8.1)

## 2023-12-10 LAB — TYPE AND SCREEN
ABO/RH(D): O POS
Antibody Screen: NEGATIVE

## 2023-12-10 LAB — PROTIME-INR
INR: 1.1 (ref 0.8–1.2)
Prothrombin Time: 14.5 s (ref 11.4–15.2)

## 2023-12-10 MED ORDER — ENZALUTAMIDE 40 MG PO TABS
160.0000 mg | ORAL_TABLET | Freq: Every day | ORAL | 2 refills | Status: AC
  Filled 2023-12-10: qty 120, 30d supply, fill #0

## 2023-12-10 NOTE — ED Triage Notes (Addendum)
 Pt comes via EMS from home with c/o blood in brief when family changed it . Per daughter this has been going on for weeks. Pt states it has been rectal bleeding heavy.  Pt has prostate cancer. Pt has been oral chemo currently.  Pt did have stroke in July.  Pt is on thinner.

## 2023-12-10 NOTE — ED Notes (Signed)
 Pt's family member to desk for assistance in BR; pt in BR, agitated, not allowing this nurse to enter to assist him; after several attempts pt finally allows nurse into BR stall; pt has 1 small drop bright red blood noted on toilet seat; attends on floor saturated in urine and dark brown smear; assisted pt to replace clothing and back in w/c; informed family member to bring pt back to nurse if he requires BR again so that I can monitor for any further bleeding

## 2023-12-10 NOTE — ED Triage Notes (Signed)
 Refer to first nurse note.

## 2023-12-11 NOTE — Telephone Encounter (Signed)
 Spoke to [facility] and informed her that since he in now on hospice, he is ok to discontinue Xtandi . Informed her that refill was sent but that she may deny the delivery when they call.

## 2023-12-12 ENCOUNTER — Other Ambulatory Visit (HOSPITAL_COMMUNITY): Payer: Self-pay

## 2023-12-12 ENCOUNTER — Other Ambulatory Visit: Payer: Self-pay

## 2024-01-01 ENCOUNTER — Ambulatory Visit: Payer: Medicare Other | Admitting: Nurse Practitioner

## 2024-02-05 ENCOUNTER — Ambulatory Visit

## 2024-02-05 ENCOUNTER — Encounter

## 2024-02-09 ENCOUNTER — Encounter: Payer: Self-pay | Admitting: Oncology

## 2024-02-13 ENCOUNTER — Ambulatory Visit

## 2024-02-18 ENCOUNTER — Telehealth: Payer: Self-pay | Admitting: Cardiology

## 2024-02-18 ENCOUNTER — Other Ambulatory Visit (HOSPITAL_COMMUNITY): Payer: Self-pay

## 2024-02-18 NOTE — Telephone Encounter (Signed)
 Called re missed remote

## 2024-02-27 ENCOUNTER — Encounter: Payer: Self-pay | Admitting: Emergency Medicine

## 2024-02-27 ENCOUNTER — Observation Stay
Admission: EM | Admit: 2024-02-27 | Source: Home / Self Care | Attending: Emergency Medicine | Admitting: Emergency Medicine

## 2024-02-27 ENCOUNTER — Emergency Department

## 2024-02-27 ENCOUNTER — Other Ambulatory Visit: Payer: Self-pay

## 2024-02-27 DIAGNOSIS — E1169 Type 2 diabetes mellitus with other specified complication: Secondary | ICD-10-CM

## 2024-02-27 DIAGNOSIS — R262 Difficulty in walking, not elsewhere classified: Secondary | ICD-10-CM

## 2024-02-27 DIAGNOSIS — E1122 Type 2 diabetes mellitus with diabetic chronic kidney disease: Secondary | ICD-10-CM

## 2024-02-27 DIAGNOSIS — C61 Malignant neoplasm of prostate: Secondary | ICD-10-CM

## 2024-02-27 DIAGNOSIS — W19XXXA Unspecified fall, initial encounter: Principal | ICD-10-CM

## 2024-02-27 DIAGNOSIS — R7989 Other specified abnormal findings of blood chemistry: Secondary | ICD-10-CM

## 2024-02-27 DIAGNOSIS — J449 Chronic obstructive pulmonary disease, unspecified: Secondary | ICD-10-CM

## 2024-02-27 DIAGNOSIS — I480 Paroxysmal atrial fibrillation: Secondary | ICD-10-CM

## 2024-02-27 DIAGNOSIS — N390 Urinary tract infection, site not specified: Secondary | ICD-10-CM | POA: Insufficient documentation

## 2024-02-27 LAB — TROPONIN T, HIGH SENSITIVITY
Troponin T High Sensitivity: 49 ng/L — ABNORMAL HIGH (ref 0–19)
Troponin T High Sensitivity: 48 ng/L — ABNORMAL HIGH (ref 0–19)

## 2024-02-27 LAB — CBC WITH DIFFERENTIAL/PLATELET
Abs Immature Granulocytes: 0.01 10*3/uL (ref 0.00–0.07)
Basophils Absolute: 0 10*3/uL (ref 0.0–0.1)
Basophils Relative: 1 %
Eosinophils Absolute: 0.2 10*3/uL (ref 0.0–0.5)
Eosinophils Relative: 4 %
HCT: 38.3 % — ABNORMAL LOW (ref 39.0–52.0)
Hemoglobin: 11.5 g/dL — ABNORMAL LOW (ref 13.0–17.0)
Immature Granulocytes: 0 %
Lymphocytes Relative: 30 %
Lymphs Abs: 1.3 10*3/uL (ref 0.7–4.0)
MCH: 26.9 pg (ref 26.0–34.0)
MCHC: 30 g/dL (ref 30.0–36.0)
MCV: 89.7 fL (ref 80.0–100.0)
Monocytes Absolute: 0.8 10*3/uL (ref 0.1–1.0)
Monocytes Relative: 18 %
Neutro Abs: 2.1 10*3/uL (ref 1.7–7.7)
Neutrophils Relative %: 47 %
Platelets: 233 10*3/uL (ref 150–400)
RBC: 4.27 MIL/uL (ref 4.22–5.81)
RDW: 15.2 % (ref 11.5–15.5)
WBC: 4.3 10*3/uL (ref 4.0–10.5)
nRBC: 0 % (ref 0.0–0.2)

## 2024-02-27 LAB — URINALYSIS, W/ REFLEX TO CULTURE (INFECTION SUSPECTED)
Bacteria, UA: NONE SEEN
Bilirubin Urine: NEGATIVE
Glucose, UA: NEGATIVE mg/dL
Hgb urine dipstick: NEGATIVE
Ketones, ur: NEGATIVE mg/dL
Nitrite: NEGATIVE
Protein, ur: 100 mg/dL — AB
Specific Gravity, Urine: 1.034 — ABNORMAL HIGH (ref 1.005–1.030)
WBC, UA: >50 WBC/hpf (ref 0–5)
pH: 5 (ref 5.0–8.0)

## 2024-02-27 LAB — COMPREHENSIVE METABOLIC PANEL WITH GFR
ALT: 13 U/L (ref 0–44)
AST: 20 U/L (ref 15–41)
Albumin: 3.9 g/dL (ref 3.5–5.0)
Alkaline Phosphatase: 135 U/L — ABNORMAL HIGH (ref 38–126)
Anion gap: 11 (ref 5–15)
BUN: 44 mg/dL — ABNORMAL HIGH (ref 8–23)
CO2: 24 mmol/L (ref 22–32)
Calcium: 8.9 mg/dL (ref 8.9–10.3)
Chloride: 110 mmol/L (ref 98–111)
Creatinine, Ser: 2.04 mg/dL — ABNORMAL HIGH (ref 0.61–1.24)
GFR, Estimated: 31 mL/min — ABNORMAL LOW
Glucose, Bld: 143 mg/dL — ABNORMAL HIGH (ref 70–99)
Potassium: 4.6 mmol/L (ref 3.5–5.1)
Sodium: 144 mmol/L (ref 135–145)
Total Bilirubin: 0.3 mg/dL (ref 0.0–1.2)
Total Protein: 7.3 g/dL (ref 6.5–8.1)

## 2024-02-27 LAB — CK: Total CK: 70 U/L (ref 49–397)

## 2024-02-27 MED ORDER — TRAZODONE HCL 50 MG PO TABS: 25.0000 mg | ORAL_TABLET | Freq: Every evening | ORAL | Status: AC | PRN

## 2024-02-27 MED ORDER — HEPARIN SODIUM (PORCINE) 5000 UNIT/ML IJ SOLN
5000.0000 [IU] | Freq: Two times a day (BID) | INTRAMUSCULAR | Status: AC
  Administered 2024-02-27: 5000 [IU] via SUBCUTANEOUS
  Filled 2024-02-27: qty 1

## 2024-02-27 MED ORDER — PANTOPRAZOLE SODIUM 40 MG PO TBEC
40.0000 mg | DELAYED_RELEASE_TABLET | Freq: Every day | ORAL | Status: AC
  Administered 2024-02-27: 40 mg via ORAL
  Filled 2024-02-27: qty 1

## 2024-02-27 MED ORDER — ALBUTEROL SULFATE (2.5 MG/3ML) 0.083% IN NEBU: 3.0000 mL | INHALATION_SOLUTION | RESPIRATORY_TRACT | Status: AC | PRN

## 2024-02-27 MED ORDER — ACETAMINOPHEN 650 MG RE SUPP: 650.0000 mg | Freq: Four times a day (QID) | RECTAL | Status: AC | PRN

## 2024-02-27 MED ORDER — SODIUM CHLORIDE 0.9 % IV SOLN: 1.0000 g | INTRAVENOUS | Status: AC

## 2024-02-27 MED ORDER — AMIODARONE HCL 200 MG PO TABS
200.0000 mg | ORAL_TABLET | Freq: Every day | ORAL | Status: AC
  Administered 2024-02-27: 200 mg via ORAL
  Filled 2024-02-27: qty 1

## 2024-02-27 MED ORDER — SODIUM CHLORIDE 0.9 % IV SOLN
1.0000 g | Freq: Once | INTRAVENOUS | Status: AC
  Administered 2024-02-27: 1 g via INTRAVENOUS
  Filled 2024-02-27: qty 10

## 2024-02-27 MED ORDER — DAPAGLIFLOZIN PROPANEDIOL 5 MG PO TABS
5.0000 mg | ORAL_TABLET | Freq: Every day | ORAL | Status: AC
  Administered 2024-02-27: 5 mg via ORAL
  Filled 2024-02-27: qty 1

## 2024-02-27 MED ORDER — VERAPAMIL HCL ER 120 MG PO TBCR
120.0000 mg | EXTENDED_RELEASE_TABLET | Freq: Every day | ORAL | Status: AC
  Administered 2024-02-27: 120 mg via ORAL
  Filled 2024-02-27: qty 1

## 2024-02-27 MED ORDER — TIOTROPIUM BROMIDE 2.5 MCG/ACT IN AERS: 2.0000 | INHALATION_SPRAY | Freq: Every day | RESPIRATORY_TRACT | Status: DC

## 2024-02-27 MED ORDER — SENNOSIDES-DOCUSATE SODIUM 8.6-50 MG PO TABS: 1.0000 | ORAL_TABLET | Freq: Every evening | ORAL | Status: AC | PRN

## 2024-02-27 MED ORDER — EDOXABAN TOSYLATE 30 MG PO TABS: 30.0000 mg | ORAL_TABLET | Freq: Every day | ORAL | Status: DC

## 2024-02-27 MED ORDER — HEPARIN SODIUM (PORCINE) 5000 UNIT/ML IJ SOLN: 5000.0000 [IU] | Freq: Two times a day (BID) | INTRAMUSCULAR | Status: DC

## 2024-02-27 MED ORDER — LOPERAMIDE HCL 2 MG PO CAPS: 2.0000 mg | ORAL_CAPSULE | Freq: Three times a day (TID) | ORAL | Status: AC | PRN

## 2024-02-27 MED ORDER — ONDANSETRON HCL 4 MG PO TABS: 4.0000 mg | ORAL_TABLET | Freq: Four times a day (QID) | ORAL | Status: AC | PRN

## 2024-02-27 MED ORDER — ATORVASTATIN CALCIUM 20 MG PO TABS
10.0000 mg | ORAL_TABLET | Freq: Every day | ORAL | Status: AC
  Administered 2024-02-27: 10 mg via ORAL
  Filled 2024-02-27: qty 1

## 2024-02-27 MED ORDER — GLUCERNA SHAKE PO LIQD
237.0000 mL | Freq: Three times a day (TID) | ORAL | Status: AC
  Administered 2024-02-27 (×2): 237 mL via ORAL

## 2024-02-27 MED ORDER — ONDANSETRON HCL 4 MG/2ML IJ SOLN: 4.0000 mg | Freq: Four times a day (QID) | INTRAMUSCULAR | Status: AC | PRN

## 2024-02-27 MED ORDER — ENZALUTAMIDE 40 MG PO TABS: 160.0000 mg | ORAL_TABLET | Freq: Every day | ORAL | Status: DC

## 2024-02-27 MED ORDER — UMECLIDINIUM BROMIDE 62.5 MCG/ACT IN AEPB
1.0000 | INHALATION_SPRAY | Freq: Every day | RESPIRATORY_TRACT | Status: AC
  Administered 2024-02-27: 1 via RESPIRATORY_TRACT
  Filled 2024-02-27: qty 7

## 2024-02-27 MED ORDER — ACETAMINOPHEN 325 MG PO TABS: 650.0000 mg | ORAL_TABLET | Freq: Four times a day (QID) | ORAL | Status: AC | PRN

## 2024-02-27 NOTE — Progress Notes (Signed)
 [facility] ED - [facility] Patient  This patient is a current hospice patient with [facility] admitted with terminal diagnosis of malignant neoplasm of prostate.  Patient is a FULL Code and PCG is daughter, Annabella Brighter [phone].   We will continue to follow for any discharge planning needs and to coordinate continuation of hospice care.  Please reach out with any hospice related questions or concerns.  Thank you, Daphne Shed, [facility] Liaison [phone]

## 2024-02-27 NOTE — H&P (Addendum)
 " History and Physical    [name] FMW:[id number] DOB: 07/20/1933 DOA: 02/27/2024  PCP: Pcp, No (Confirm with patient/family/NH records and if not entered, this has to be entered at [facility] point of entry) Patient coming from: Home  I have personally briefly reviewed patient's old medical records in [facility]  Chief Complaint: I fell at home  HPI: [name] is a 89 y.o. male with medical history significant of advanced dementia, HTN, HLD, IIDM, permanent A-fib on edoxaban , SSS status post PPM, chronic HFrEF with LVEF 45-50%, CKD stage IIIb, paroxysmal V. tach on amiodarone , stage IVb metastatic prostate cancer on Xtandi  and leuprolide , was sent by daughter for worsening of generalized weakness and fall at home.  Patient lives with wife, due to severe dementia, his daily activity are very restricted and depends largely on his wife.  Daughter reported that lately patient has been feeling general weakness gradually getting worse and this morning he had a unwitnessed fall at home.  Patient does not remember what has happened but he did not remember he fell at home.  Denies any dysuria no chest pain back pain abdominal pain denies any limb pain or joint pains.  At baseline he uses roller walker to ambulate.  Wife also has dementia and has been having struggles to keep up with patient's care.  ED Course: Temperature 90, no tachycardia no hypotension not hypoxic.  Chest x-ray negative for acute infiltrates, UA showed 3+ WBC and 2+ RBC, WBC 4.3 hemoglobin 11.5, BUN 44 creatinine 2.0.  Patient was given ceftriaxone  in the ED.  Review of Systems: As per HPI otherwise 14 point review of systems negative.    Past Medical History:  Diagnosis Date   Acute deep vein thrombosis (DVT) of femoral vein of left lower extremity (HCC)    Atrial fibrillation (HCC)    CHF (congestive heart failure) (HCC)    Chronic kidney disease    Diabetes (HCC)    Hearing loss    Hyperlipidemia    Liver  cyst    Nephrolithiasis    OSA (obstructive sleep apnea)    Prostate cancer (HCC)    SSS (sick sinus syndrome) (HCC)    Stroke (HCC)    Valvular heart disease     Past Surgical History:  Procedure Laterality Date   CATARACT EXTRACTION     COLONOSCOPY WITH PROPOFOL  N/A 07/02/2019   Procedure: COLONOSCOPY WITH PROPOFOL ;  Surgeon: Unk Corinn Skiff, MD;  Location: [facility] ENDOSCOPY;  Service: Gastroenterology;  Laterality: N/A;   ESOPHAGOGASTRODUODENOSCOPY (EGD) WITH PROPOFOL  N/A 03/19/2022   Procedure: ESOPHAGOGASTRODUODENOSCOPY (EGD) WITH PROPOFOL ;  Surgeon: Therisa Bi, MD;  Location: [facility] ENDOSCOPY;  Service: Gastroenterology;  Laterality: N/A;   FLEXIBLE SIGMOIDOSCOPY N/A 09/23/2023   Procedure: SIGMOIDOSCOPY, FLEXIBLE;  Surgeon: Toledo, Ladell POUR, MD;  Location: [facility] ENDOSCOPY;  Service: Gastroenterology;  Laterality: N/A;   IR CT HEAD LTD  08/27/2019   IR CT HEAD LTD  06/28/2023   IR PERCUTANEOUS ART THROMBECTOMY/INFUSION INTRACRANIAL INC DIAG ANGIO  08/27/2019       IR PERCUTANEOUS ART THROMBECTOMY/INFUSION INTRACRANIAL INC DIAG ANGIO  08/27/2019   IR PERCUTANEOUS ART THROMBECTOMY/INFUSION INTRACRANIAL INC DIAG ANGIO  06/28/2023   LEFT HEART CATH AND CORONARY ANGIOGRAPHY N/A 06/24/2023   Procedure: LEFT HEART CATH AND CORONARY ANGIOGRAPHY;  Surgeon: Ammon Blunt, MD;  Location: [facility] INVASIVE CV LAB;  Service: Cardiovascular;  Laterality: N/A;   LOWER EXTREMITY ANGIOGRAPHY Left 12/25/2020   Procedure: Lower Extremity Angiography;  Surgeon: Marea Selinda RAMAN, MD;  Location: [facility] INVASIVE CV LAB;  Service: Cardiovascular;  Laterality: Left;   PACEMAKER LEADLESS INSERTION N/A 12/19/2020   Procedure: PACEMAKER LEADLESS INSERTION;  Surgeon: Ammon Blunt, MD;  Location: [facility] INVASIVE CV LAB;  Service: Cardiovascular;  Laterality: N/A;   PROSTATE CANCER     PROSTATE SURGERY     RADIOLOGY WITH ANESTHESIA N/A 08/27/2019   Procedure: IR WITH ANESTHESIA;  Surgeon: Dolphus Carrion, MD;  [facility] OR;  Service: Radiology;  Laterality: N/A;   RADIOLOGY WITH ANESTHESIA N/A 06/28/2023   Procedure: RADIOLOGY WITH ANESTHESIA;  Surgeon: Radiologist, Medication, MD;  Location: [facility] OR;  Service: Radiology;  Laterality: N/A;     reports that he has quit smoking. His smoking use included cigarettes. He has never used smokeless tobacco. He reports that he does not drink alcohol and does not use drugs.  Allergies[1]  Family History  Problem Relation Age of Onset   Colon cancer Mother    Diabetes Other    High blood pressure Other    Prostate cancer Brother    Diabetes Brother      Prior to Admission medications  Medication Sig Start Date End Date Taking? Authorizing Provider  acetaminophen  (TYLENOL ) 325 MG tablet Take 2 tablets (650 mg total) by mouth every 6 (six) hours as needed for mild pain (pain score 1-3) or fever (or Fever >/= 101). 10/01/23   Josette Ade, MD  albuterol  (VENTOLIN  HFA) 108 (90 Base) MCG/ACT inhaler Inhale 2 puffs into the lungs every 4 (four) hours as needed for wheezing or shortness of breath. 12/26/22   Liana Fish, NP  amiodarone  (PACERONE ) 200 MG tablet Take 1 tablet (200 mg total) by mouth daily. 08/07/23   Liana Fish, NP  atorvastatin  (LIPITOR) 10 MG tablet Take 1 tablet (10 mg total) by mouth daily. 08/07/23   Abernathy, Alyssa, NP  Cholecalciferol (VITAMIN D-3 PO) Take 1 capsule by mouth daily.    [provider]  dapagliflozin  propanediol (FARXIGA ) 5 MG TABS tablet Take 1 tablet (5 mg total) by mouth daily. 11/01/23   Liana Fish, NP  edoxaban  (SAVAYSA ) 30 MG TABS tablet Take 30 mg by mouth daily.    [provider]  enzalutamide  (XTANDI ) 40 MG tablet Take 4 tablets (160 mg total) by mouth daily. 12/10/23   Babara Call, MD  feeding supplement, GLUCERNA SHAKE, (GLUCERNA SHAKE) LIQD Take 237 mLs by mouth 3 (three) times daily between meals. 10/01/23   Josette Ade, MD  ferrous sulfate  (FEROSUL) 325 (65 FE) MG tablet Take 1  tablet by mouth daily. 02/02/21   [provider]  loperamide  (IMODIUM ) 2 MG capsule Take 1 capsule (2 mg total) by mouth every 8 (eight) hours as needed for diarrhea or loose stools. 10/01/23   Josette Ade, MD  nitroGLYCERIN  (NITROSTAT ) 0.4 MG SL tablet Place 1 tablet (0.4 mg total) under the tongue every 5 (five) minutes as needed for chest pain. 06/27/23   Maree Hue, MD  omeprazole (PRILOSEC) 20 MG capsule Take 20 mg by mouth daily.    [provider]  Semaglutide ,0.25 or 0.5MG /DOS, (OZEMPIC , 0.25 OR 0.5 MG/DOSE,) 2 MG/3ML SOPN Inject 0.5 mg into the skin once a week. 05/17/23   Abernathy, Alyssa, NP  Tiotropium Bromide  Monohydrate (SPIRIVA  RESPIMAT) 2.5 MCG/ACT AERS Inhale 2 puffs into the lungs daily. 12/26/22   Liana Fish, NP  verapamil  (CALAN -SR) 120 MG CR tablet Take 1 tablet (120 mg total) by mouth at bedtime. 08/15/23   Riddle, Suzann, NP  vitamin B-12 (CYANOCOBALAMIN ) 1000 MCG  tablet Take 1 tablet (1,000 mcg total) by mouth daily. 12/08/20   Liana Fish, NP    Physical Exam: Vitals:   02/27/24 0844 02/27/24 0933  BP:  136/77  Pulse:  72  Resp:  18  Temp:  98 F (36.7 C)  TempSrc:  Oral  SpO2:  92%  Weight: 86.2 kg   Height: 6' 1 (1.854 m)     Constitutional: NAD, calm, comfortable Vitals:   02/27/24 0844 02/27/24 0933  BP:  136/77  Pulse:  72  Resp:  18  Temp:  98 F (36.7 C)  TempSrc:  Oral  SpO2:  92%  Weight: 86.2 kg   Height: 6' 1 (1.854 m)    Eyes: PERRL, lids and conjunctivae normal ENMT: Mucous membranes are moist. Posterior pharynx clear of any exudate or lesions.Normal dentition.  Neck: normal, supple, no masses, no thyromegaly Respiratory: clear to auscultation bilaterally, no wheezing, no crackles. Normal respiratory effort. No accessory muscle use.  Cardiovascular: Regular rate and rhythm, no murmurs / rubs / gallops. No extremity edema. 2+ pedal pulses. No carotid bruits.  Abdomen: no tenderness, no masses palpated. No  hepatosplenomegaly. Bowel sounds positive.  Musculoskeletal: no clubbing / cyanosis. No joint deformity upper and lower extremities. Good ROM, no contractures. Normal muscle tone.  Skin: no rashes, lesions, ulcers. No induration Neurologic: CN 2-12 grossly intact. Sensation intact, DTR normal. Strength 5/5 in all 4.  Psychiatric: Awake, oriented to himself, confused about time and place    Labs on Admission: I have personally reviewed following labs and imaging studies  CBC: Recent Labs  Lab 02/27/24 0930  WBC 4.3  NEUTROABS 2.1  HGB 11.5*  HCT 38.3*  MCV 89.7  PLT 233   Basic Metabolic Panel: Recent Labs  Lab 02/27/24 0930  NA 144  K 4.6  CL 110  CO2 24  GLUCOSE 143*  BUN 44*  CREATININE 2.04*  CALCIUM  8.9   GFR: Estimated Creatinine Clearance: 28.3 mL/min (A) (by C-G formula based on SCr of 2.04 mg/dL (H)). Liver Function Tests: Recent Labs  Lab 02/27/24 0930  AST 20  ALT 13  ALKPHOS 135*  BILITOT 0.3  PROT 7.3  ALBUMIN  3.9   No results for input(s): LIPASE, AMYLASE in the last 168 hours. No results for input(s): AMMONIA in the last 168 hours. Coagulation Profile: No results for input(s): INR, PROTIME in the last 168 hours. Cardiac Enzymes: No results for input(s): CKTOTAL, CKMB, CKMBINDEX, TROPONINI in the last 168 hours. BNP (last 3 results) No results for input(s): PROBNP in the last 8760 hours. HbA1C: No results for input(s): HGBA1C in the last 72 hours. CBG: No results for input(s): GLUCAP in the last 168 hours. Lipid Profile: No results for input(s): CHOL, HDL, LDLCALC, TRIG, CHOLHDL, LDLDIRECT in the last 72 hours. Thyroid  Function Tests: No results for input(s): TSH, T4TOTAL, FREET4, T3FREE, THYROIDAB in the last 72 hours. Anemia Panel: No results for input(s): VITAMINB12, FOLATE, FERRITIN, TIBC, IRON, RETICCTPCT in the last 72 hours. Urine analysis:    Component Value Date/Time    COLORURINE YELLOW (A) 02/27/2024 0856   APPEARANCEUR CLOUDY (A) 02/27/2024 0856   APPEARANCEUR Clear 12/20/2021 1615   LABSPEC 1.034 (H) 02/27/2024 0856   LABSPEC 1.015 10/12/2013 1445   PHURINE 5.0 02/27/2024 0856   GLUCOSEU NEGATIVE 02/27/2024 0856   GLUCOSEU 50 mg/dL 90/77/7984 8554   HGBUR NEGATIVE 02/27/2024 0856   BILIRUBINUR NEGATIVE 02/27/2024 0856   BILIRUBINUR Negative 12/20/2021 1615   BILIRUBINUR Negative 10/12/2013 1445  KETONESUR NEGATIVE 02/27/2024 0856   PROTEINUR 100 (A) 02/27/2024 0856   UROBILINOGEN 0.2 06/29/2019 1556   NITRITE NEGATIVE 02/27/2024 0856   LEUKOCYTESUR LARGE (A) 02/27/2024 0856   LEUKOCYTESUR Negative 10/12/2013 1445    Radiological Exams on Admission: CT Cervical Spine Wo Contrast Result Date: 02/27/2024 EXAM: CT CERVICAL SPINE WITHOUT CONTRAST 02/27/2024 09:11:11 AM TECHNIQUE: CT of the cervical spine was performed without the administration of intravenous contrast. Multiplanar reformatted images are provided for review. Automated exposure control, iterative reconstruction, and/or weight based adjustment of the mA/kV was utilized to reduce the radiation dose to as low as reasonably achievable. COMPARISON: CT cervical spine 08/28/2019. Head CT reported separately 02/27/2024. CLINICAL HISTORY: 89 year old male with neck trauma following an unobserved fall at home. FINDINGS: BONES AND ALIGNMENT: Stable cervical lordosis. Chronic elongated bilateral stylohyoid ligament calcification, which can predispose to Eagle syndrome. No acute fracture or traumatic malalignment. DEGENERATIVE CHANGES: Widespread chronic cervical spine disc and endplate degeneration has not significantly changed. Upper cervical facet arthropathy, mildly progressed since 2021 in the form of new degenerative left side C3-C4 facet ankylosis SOFT TISSUES: No prevertebral soft tissue swelling. Negative visible non-contrast thoracic inlet. IMPRESSION: 1. No acute traumatic injury identified in the  cervical spine. 2. Mild progression of cervical spine degeneration since 2021. Electronically signed by: Helayne Hurst MD 02/27/2024 09:22 AM EST RP Workstation: [id number]   CT Head Wo Contrast Result Date: 02/27/2024 EXAM: CT HEAD WITHOUT CONTRAST 02/27/2024 09:11:11 AM TECHNIQUE: CT of the head was performed without the administration of intravenous contrast. Automated exposure control, iterative reconstruction, and/or weight based adjustment of the mA/kV was utilized to reduce the radiation dose to as low as reasonably achievable. COMPARISON: Brain MRI 09/19/2023. Head CT 09/18/2023. CLINICAL HISTORY: 89 year old male. Unobserved fall at home. Left middle cerebral artery (MCA) infarct last year. FINDINGS: BRAIN AND VENTRICLES: No acute hemorrhage. No evidence of acute infarct. No hydrocephalus. No extra-axial collection. No mass effect or midline shift. Ossified atherosclerosis at the skull base. Dystrophic calcification in the bilateral basal ganglia. Stable left MCA territory encephalomalacia. Stable mild ex vacuo enlargement of the left lateral ventricle. Stable background brain volume. Stable gray-white differentiation elsewhere. No suspicious intracranial vascular hyperdensity. SINUSES: Paranasal sinuses, tympanic cavities, and mastoids are well aerated. SOFT TISSUES AND SKULL: No acute soft tissue abnormality. No skull fracture. IMPRESSION: 1. No acute traumatic injury identified. 2. Stable chronic left MCA territory infarct. Electronically signed by: Helayne Hurst MD 02/27/2024 09:19 AM EST RP Workstation: [id number]   DG Chest 1 View Result Date: 02/27/2024 CLINICAL DATA:  Status post fall.  History of dementia. EXAM: CHEST  1 VIEW COMPARISON:  09/10/2023 FINDINGS: Unchanged mild cardiomegaly.  No pulmonary vascular congestion. Unchanged elevation of the LEFT hemidiaphragm.  Lungs are clear. IMPRESSION: 1. No acute cardiopulmonary process. 2. Unchanged mild cardiomegaly. Electronically Signed   By:  Aliene Lloyd M.D.   On: 02/27/2024 09:05    EKG: Independently reviewed.  Rate controlled A-fib, chronic LBBB  Assessment/Plan Principal Problem:   Impaired ambulation Active Problems:   UTI (urinary tract infection)  (please populate well all problems here in Problem List. (For example, if patient is on BP meds at home and you resume or decide to hold them, it is a problem that needs to be her. Same for CAD, COPD, HLD and so on)  UTI - Continue ceftriaxone   Acute on chronic ambulatory impairment Generalized weakness Fall at home - Secondary to UTI - Neurological exam nonfocal - CT head and neck reassuring - PT  evaluation  CKD stage IIIb - Euvolemic - Creatinine stable  History of paroxysm V. Tach Chronic A-fib - In rate controlled A-fib and blood pressure stable - Continue amiodarone  and verapamil  - Off edoxaban   IIDM - Continue Farxiga   History of HFrEF - Euvolemic - No indication for Lasix   Advanced dementia - Mentation appears to be at baseline  Metastatic prostate cancer - Continue Xtandi  therapy  DVT prophylaxis: Heparin  subQ Code Status: Full code Family Communication: ED physician discussed with daughter/POA Disposition Plan: Expect less than 2 midnight hospital stay Consults called: None Admission status: MedSurg observation   Cort ONEIDA Mana MD [facility] Pager [phone]  02/27/2024, 10:54 AM        [1] No Known Allergies  "

## 2024-02-27 NOTE — ED Provider Notes (Signed)
 "  [facility] Provider Note    Event Date/Time   First MD Initiated Contact with Patient 02/27/24 [phone]     (approximate)   History   Fall   HPI  [name] is a 89 y.o. male with history of dementia, hypertension, diabetes, history of CVA with aphasia, COPD, A-fib on amiodarone , presenting with unwitnessed fall.  Patient denies any pain anywhere, had reported hitting his head.  He has no complaints.  Per independent history from EMS, he has history of dementia, per wife he is at his mental baseline.  States that she heard him call out and found him on the floor beside the bed.  Unclear when he fell.  States he is not on any blood thinning medications.  Per independent chart review, he was seen by primary care in October, they also did prognosis advance care planning, he does not qualify for Medicare so has not been to receive home health aide services, primary care discussed with family about looking into nursing homes since wife also has dementia.  He does have history of sick sinus syndrome, status post pacemaker.     Physical Exam   Triage Vital Signs: ED Triage Vitals  Encounter Vitals Group     BP      Girls Systolic BP Percentile      Girls Diastolic BP Percentile      Boys Systolic BP Percentile      Boys Diastolic BP Percentile      Pulse      Resp      Temp      Temp src      SpO2      Weight      Height      Head Circumference      Peak Flow      Pain Score      Pain Loc      Pain Education      Exclude from Growth Chart     Most recent vital signs: Vitals:   02/27/24 0933  BP: 136/77  Pulse: 72  Resp: 18  Temp: 98 F (36.7 C)  SpO2: 92%     General: Awake, no distress.  Able to give his name but not place or time. CV:  Good peripheral perfusion.  Resp:  Normal effort.  No thoracic cage tenderness, no tachypnea or respiratory distress Abd:  No distention.  Soft nontender Other:  No palpable skull deformities or  tenderness, no midline spinal tenderness, no tenderness all 4 extremities, he does have bilateral lower extremity edema that appears symmetrical.   ED Results / Procedures / Treatments   Labs (all labs ordered are listed, but only abnormal results are displayed) Labs Reviewed  COMPREHENSIVE METABOLIC PANEL WITH GFR - Abnormal; Notable for the following components:      Result Value   Glucose, Bld 143 (*)    BUN 44 (*)    Creatinine, Ser 2.04 (*)    Alkaline Phosphatase 135 (*)    GFR, Estimated 31 (*)    All other components within normal limits  CBC WITH DIFFERENTIAL/PLATELET - Abnormal; Notable for the following components:   Hemoglobin 11.5 (*)    HCT 38.3 (*)    All other components within normal limits  URINALYSIS, W/ REFLEX TO CULTURE (INFECTION SUSPECTED) - Abnormal; Notable for the following components:   Color, Urine YELLOW (*)    APPearance CLOUDY (*)    Specific Gravity, Urine 1.034 (*)  Protein, ur 100 (*)    Leukocytes,Ua LARGE (*)    All other components within normal limits  TROPONIN T, HIGH SENSITIVITY - Abnormal; Notable for the following components:   Troponin T High Sensitivity 49 (*)    All other components within normal limits  CK  TROPONIN T, HIGH SENSITIVITY     EKG  EKG shows, atrial fibrillation rate 61, widened QRS, normal QTc, does show some paced rhythms with PVCs, no obvious ischemic ST elevation, does not meet Sgarbossa's criteria, not significantly changed compared to prior   RADIOLOGY On my independent interpretation, CT head without obvious intracranial hemorrhage   PROCEDURES:  Critical Care performed: No  Procedures   MEDICATIONS ORDERED IN ED: Medications  cefTRIAXone  (ROCEPHIN ) 1 g in sodium chloride  0.9 % 100 mL IVPB (1 g Intravenous New Bag/Given 02/27/24 1045)  enzalutamide  (XTANDI ) tablet 160 mg (has no administration in time range)  amiodarone  (PACERONE ) tablet 200 mg (has no administration in time range)  atorvastatin   (LIPITOR) tablet 10 mg (has no administration in time range)  verapamil  (CALAN -SR) CR tablet 120 mg (has no administration in time range)  dapagliflozin  propanediol (FARXIGA ) tablet 5 mg (has no administration in time range)  loperamide  (IMODIUM ) capsule 2 mg (has no administration in time range)  pantoprazole  (PROTONIX ) EC tablet 40 mg (has no administration in time range)  feeding supplement (GLUCERNA SHAKE) (GLUCERNA SHAKE) liquid 237 mL (has no administration in time range)  albuterol  (VENTOLIN  HFA) 108 (90 Base) MCG/ACT inhaler 2 puff (has no administration in time range)  Tiotropium Bromide  AERS 2 puff (has no administration in time range)  cefTRIAXone  (ROCEPHIN ) 1 g in sodium chloride  0.9 % 100 mL IVPB (has no administration in time range)  heparin  injection 5,000 Units (has no administration in time range)  acetaminophen  (TYLENOL ) tablet 650 mg (has no administration in time range)    Or  acetaminophen  (TYLENOL ) suppository 650 mg (has no administration in time range)  traZODone  (DESYREL ) tablet 25 mg (has no administration in time range)  senna-docusate (Senokot-S) tablet 1 tablet (has no administration in time range)  ondansetron  (ZOFRAN ) tablet 4 mg (has no administration in time range)    Or  ondansetron  (ZOFRAN ) injection 4 mg (has no administration in time range)     IMPRESSION / MDM / ASSESSMENT AND PLAN / ED COURSE  I reviewed the triage vital signs and the nursing notes.                              Differential diagnosis includes, but is not limited to, unwitnessed fall, considered atypical ACS, electrolyte derangements, UTI.  For the fall, he has no focal pain on exam, has no complaints, can consider intracranial hemorrhage, fracture.  Will get labs, EKG, troponin, chest x-ray, UA, CT head, cervical spine.  Patient is an active hospice patient, will reach out to [facility].  Patient's presentation is most consistent with acute presentation with potential threat to life  or bodily function.  Independent interpretation of labs and imaging below.  Clinical course as below.  Patient's labs and imaging, no traumatic findings.  Labs are consistent with UTI as well as mildly elevated troponin.  Antibiotics started in the emergency department.  Consulted hospitalist for admission.  He is admitted.  Family has been updated.   Clinical Course as of 02/27/24 1054  Fri Feb 27, 2024  9147 Attempted to call [facility], left a voice message to call back. [TT]  9142 Spoke to Daphne Sar from other care, patient is home hospice, has aides and nurses who come by to help him out.  Hospice came out to see him yesterday and he was doing well, was eating appropriately.  They also talked to daughter who is his POA.  States that he is on home hospice but they are trying to look into having him also go to a facility if possible since he is having increasing care requirements.  Did discuss with her about the workup we are initiating and she is agreeable with the plan to proceed. [TT]  [id number] CT Cervical Spine Wo Contrast IMPRESSION: 1. No acute traumatic injury identified in the cervical spine. 2. Mild progression of cervical spine degeneration since 2021.   [TT]  0931 CT Head Wo Contrast IMPRESSION: 1. No acute traumatic injury identified. 2. Stable chronic left MCA territory infarct.   [TT]  0931 DG Chest 1 View IMPRESSION: 1. No acute cardiopulmonary process. 2. Unchanged mild cardiomegaly.   [TT]  1026 Independent review labs, electrolytes not severely deranged, creatinine is elevated but downtrending compared to prior, his troponin is mildly elevated, no leukocytosis.  UA does show UTI.  Will start him antibiotics. [TT]    Clinical Course User Index [TT] Waymond Lorelle Cummins, MD     FINAL CLINICAL IMPRESSION(S) / ED DIAGNOSES   Final diagnoses:  Fall, initial encounter  Elevated troponin  Urinary tract infection without hematuria, site unspecified     Rx / DC Orders    ED Discharge Orders     None        Note:  This document was prepared using Dragon voice recognition software and may include unintentional dictation errors.    Waymond Lorelle Cummins, MD 02/27/24 1054  "

## 2024-02-27 NOTE — Progress Notes (Signed)
 [facility] ED 32A [facility] Patient Note   [name] is a current [facility] patient with a terminal diagnosis of malignant neoplasm of prostate.  Patient was transferred to ED via EMS following fall.  Patient currently admitted with UTI.  Per Dr. Cassandria Serve, this is a related admission.  Patient is FULL code.   Patient is alone during bedside visit.  Patient pleasantly confused and appears comfortable.  Patient did remove IV access and team is working on getting IV started again.  Patient does have new UTI dx and will require IVAB.  Patient is GIP appropriate at this time with need for IV antibiotics.   Vital Signs:  98.5 132/80 80 RR 20 94% RA  IV Meds: cefTRIAXone  (ROCEPHIN ) 1 g in sodium chloride  0.9 % 100 mL IVPB x 1 dose   Input and Output: none recorded    Abnormal Labs:  BUN 44 Creatinine 2.04 alkaline phosphate 135 GFR 31 hemoglobin 11.5 HCT 38.3    Diagnostics:   Chest Xray IMPRESSION: 1. No acute cardiopulmonary process. 2. Unchanged mild cardiomegaly.  CT Head WO contrast IMPRESSION: 1. No acute traumatic injury identified. 2. Stable chronic left MCA territory infarct.  CT Cervical spine WO contrast  IMPRESSION: 1. No acute traumatic injury identified in the cervical spine. 2. Mild progression of cervical spine degeneration since 2021.     Hospital Assessment & Plan:  Per MD note: Cort IVAR Mana, MD 2.6.26 Assessment/Plan Principal Problem:   Impaired ambulation Active Problems:   UTI (urinary tract infection)  (please populate well all problems here in Problem List. (For example, if patient is on BP meds at home and you resume or decide to hold them, it is a problem that needs to be her. Same for CAD, COPD, HLD and so on)   UTI - Continue ceftriaxone    Acute on chronic ambulatory impairment Generalized weakness Fall at home - Secondary to UTI - Neurological exam nonfocal - CT head and neck reassuring - PT  evaluation   CKD stage IIIb - Euvolemic - Creatinine stable   History of paroxysm V. Tach Chronic A-fib - In rate controlled A-fib and blood pressure stable - Continue amiodarone  and verapamil  - Off edoxaban    IIDM - Continue Farxiga    History of HFrEF - Euvolemic - No indication for Lasix    Advanced dementia - Mentation appears to be at baseline   Metastatic prostate cancer - Continue Xtandi  therapy   DVT prophylaxis: Heparin  subQ Code Status: Full code Family Communication: ED physician discussed with daughter/POA Disposition Plan: Expect less than 2 midnight hospital stay Consults called: None Admission status: MedSurg observation  Discharge Planning- ongoing- home with hospice  Family Contact-  daughter Annabella [phone] IDT: updated Goals of Care: Full code    Please do not hesitate to call for any hospice related questions or concerns.   Daphne Shed, [facility] Liaison [phone]

## 2024-02-27 NOTE — ED Triage Notes (Signed)
 Presents via EMS from home  Unwitnessed fall Denies any pain hx of dementia  But pt is at baseline

## 2024-02-27 NOTE — Plan of Care (Signed)
  Problem: Clinical Measurements: Goal: Cardiovascular complication will be avoided Outcome: Progressing   Problem: Elimination: Goal: Will not experience complications related to urinary retention Outcome: Progressing   Problem: Pain Managment: Goal: General experience of comfort will improve and/or be controlled Outcome: Progressing   Problem: Safety: Goal: Ability to remain free from injury will improve Outcome: Progressing

## 2024-05-06 ENCOUNTER — Encounter

## 2024-05-06 ENCOUNTER — Ambulatory Visit

## 2024-05-14 ENCOUNTER — Ambulatory Visit

## 2024-08-05 ENCOUNTER — Encounter

## 2024-08-05 ENCOUNTER — Ambulatory Visit

## 2024-08-13 ENCOUNTER — Ambulatory Visit

## 2024-11-04 ENCOUNTER — Encounter

## 2024-11-04 ENCOUNTER — Ambulatory Visit

## 2024-11-12 ENCOUNTER — Ambulatory Visit

## 2025-02-03 ENCOUNTER — Encounter

## 2025-02-03 ENCOUNTER — Ambulatory Visit

## 2025-02-11 ENCOUNTER — Ambulatory Visit

## 2025-05-05 ENCOUNTER — Encounter

## 2025-05-05 ENCOUNTER — Ambulatory Visit

## 2025-08-04 ENCOUNTER — Encounter

## 2025-08-04 ENCOUNTER — Ambulatory Visit
